# Patient Record
Sex: Female | Born: 1950 | Race: White | Hispanic: No | Marital: Married
Health system: Southern US, Community
[De-identification: ages and names within clinical notes are randomized; demographics above are authoritative.]

## PROBLEM LIST (undated history)

## (undated) ENCOUNTER — Emergency Department (HOSPITAL_COMMUNITY)

## (undated) DIAGNOSIS — M1990 Unspecified osteoarthritis, unspecified site: Secondary | ICD-10-CM

## (undated) DIAGNOSIS — K922 Gastrointestinal hemorrhage, unspecified: Secondary | ICD-10-CM

## (undated) DIAGNOSIS — I200 Unstable angina: Secondary | ICD-10-CM

## (undated) DIAGNOSIS — C189 Malignant neoplasm of colon, unspecified: Secondary | ICD-10-CM

## (undated) DIAGNOSIS — I442 Atrioventricular block, complete: Secondary | ICD-10-CM

## (undated) DIAGNOSIS — K219 Gastro-esophageal reflux disease without esophagitis: Secondary | ICD-10-CM

## (undated) DIAGNOSIS — E871 Hypo-osmolality and hyponatremia: Secondary | ICD-10-CM

## (undated) DIAGNOSIS — M797 Fibromyalgia: Secondary | ICD-10-CM

## (undated) DIAGNOSIS — R413 Other amnesia: Secondary | ICD-10-CM

## (undated) DIAGNOSIS — F32A Depression, unspecified: Secondary | ICD-10-CM

## (undated) DIAGNOSIS — D509 Iron deficiency anemia, unspecified: Secondary | ICD-10-CM

## (undated) DIAGNOSIS — M545 Low back pain, unspecified: Secondary | ICD-10-CM

## (undated) DIAGNOSIS — K259 Gastric ulcer, unspecified as acute or chronic, without hemorrhage or perforation: Secondary | ICD-10-CM

## (undated) DIAGNOSIS — E785 Hyperlipidemia, unspecified: Secondary | ICD-10-CM

## (undated) DIAGNOSIS — R55 Syncope and collapse: Secondary | ICD-10-CM

## (undated) DIAGNOSIS — D472 Monoclonal gammopathy: Secondary | ICD-10-CM

## (undated) DIAGNOSIS — F0390 Unspecified dementia without behavioral disturbance: Secondary | ICD-10-CM

## (undated) DIAGNOSIS — G8929 Other chronic pain: Secondary | ICD-10-CM

## (undated) DIAGNOSIS — R519 Headache, unspecified: Secondary | ICD-10-CM

## (undated) DIAGNOSIS — F329 Major depressive disorder, single episode, unspecified: Secondary | ICD-10-CM

## (undated) DIAGNOSIS — I2510 Atherosclerotic heart disease of native coronary artery without angina pectoris: Secondary | ICD-10-CM

## (undated) DIAGNOSIS — E039 Hypothyroidism, unspecified: Secondary | ICD-10-CM

## (undated) DIAGNOSIS — G40409 Other generalized epilepsy and epileptic syndromes, not intractable, without status epilepticus: Secondary | ICD-10-CM

## (undated) DIAGNOSIS — Z950 Presence of cardiac pacemaker: Secondary | ICD-10-CM

## (undated) DIAGNOSIS — R51 Headache: Secondary | ICD-10-CM

## (undated) DIAGNOSIS — I219 Acute myocardial infarction, unspecified: Secondary | ICD-10-CM

## (undated) HISTORY — DX: Iron deficiency anemia, unspecified: D50.9

## (undated) HISTORY — PX: POSTERIOR LAMINECTOMY / DECOMPRESSION CERVICAL SPINE: SUR739

## (undated) HISTORY — PX: INSERT / REPLACE / REMOVE PACEMAKER: SUR710

## (undated) HISTORY — DX: Monoclonal gammopathy: D47.2

## (undated) HISTORY — DX: Atherosclerotic heart disease of native coronary artery without angina pectoris: I25.10

## (undated) HISTORY — PX: CORONARY ANGIOPLASTY WITH STENT PLACEMENT: SHX49

## (undated) HISTORY — PX: COLONOSCOPY: SHX174

## (undated) HISTORY — PX: ANTERIOR CERVICAL DECOMP/DISCECTOMY FUSION: SHX1161

## (undated) HISTORY — DX: Hyperlipidemia, unspecified: E78.5

## (undated) HISTORY — DX: Hypothyroidism, unspecified: E03.9

## (undated) HISTORY — DX: Acute myocardial infarction, unspecified: I21.9

## (undated) HISTORY — PX: COLON SURGERY: SHX602

## (undated) HISTORY — PX: VAGINAL HYSTERECTOMY: SUR661

## (undated) HISTORY — DX: Malignant neoplasm of colon, unspecified: C18.9

## (undated) HISTORY — PX: BACK SURGERY: SHX140

## (undated) HISTORY — DX: Gastro-esophageal reflux disease without esophagitis: K21.9

## (undated) HISTORY — DX: Syncope and collapse: R55

## (undated) HISTORY — DX: Other amnesia: R41.3

---

## 1971-07-04 HISTORY — PX: DILATION AND CURETTAGE OF UTERUS: SHX78

## 2005-04-06 ENCOUNTER — Other Ambulatory Visit: Admission: RE | Admit: 2005-04-06 | Discharge: 2005-04-06 | Payer: Self-pay | Admitting: Obstetrics and Gynecology

## 2005-07-03 DIAGNOSIS — I219 Acute myocardial infarction, unspecified: Secondary | ICD-10-CM

## 2005-07-03 HISTORY — DX: Acute myocardial infarction, unspecified: I21.9

## 2005-07-03 HISTORY — PX: CORONARY ARTERY BYPASS GRAFT: SHX141

## 2005-08-31 ENCOUNTER — Inpatient Hospital Stay (HOSPITAL_COMMUNITY): Admission: EM | Admit: 2005-08-31 | Discharge: 2005-09-02 | Payer: Self-pay | Admitting: Emergency Medicine

## 2005-10-02 ENCOUNTER — Inpatient Hospital Stay (HOSPITAL_COMMUNITY): Admission: EM | Admit: 2005-10-02 | Discharge: 2005-10-04 | Payer: Self-pay | Admitting: Emergency Medicine

## 2005-10-26 ENCOUNTER — Encounter (HOSPITAL_COMMUNITY): Admission: RE | Admit: 2005-10-26 | Discharge: 2005-12-31 | Payer: Self-pay | Admitting: *Deleted

## 2005-12-14 ENCOUNTER — Encounter (INDEPENDENT_AMBULATORY_CARE_PROVIDER_SITE_OTHER): Payer: Self-pay | Admitting: *Deleted

## 2005-12-14 ENCOUNTER — Inpatient Hospital Stay (HOSPITAL_COMMUNITY): Admission: EM | Admit: 2005-12-14 | Discharge: 2005-12-26 | Payer: Self-pay | Admitting: Emergency Medicine

## 2006-01-25 ENCOUNTER — Encounter (HOSPITAL_COMMUNITY): Admission: RE | Admit: 2006-01-25 | Discharge: 2006-04-25 | Payer: Self-pay | Admitting: Cardiology

## 2006-04-14 ENCOUNTER — Observation Stay (HOSPITAL_COMMUNITY): Admission: EM | Admit: 2006-04-14 | Discharge: 2006-04-15 | Payer: Self-pay | Admitting: Emergency Medicine

## 2006-04-26 ENCOUNTER — Encounter (HOSPITAL_COMMUNITY): Admission: RE | Admit: 2006-04-26 | Discharge: 2006-07-25 | Payer: Self-pay | Admitting: *Deleted

## 2006-05-10 ENCOUNTER — Emergency Department (HOSPITAL_COMMUNITY): Admission: EM | Admit: 2006-05-10 | Discharge: 2006-05-11 | Payer: Self-pay | Admitting: Emergency Medicine

## 2006-08-06 ENCOUNTER — Other Ambulatory Visit: Admission: RE | Admit: 2006-08-06 | Discharge: 2006-08-06 | Payer: Self-pay | Admitting: Family Medicine

## 2006-12-31 ENCOUNTER — Observation Stay (HOSPITAL_COMMUNITY): Admission: AD | Admit: 2006-12-31 | Discharge: 2007-01-01 | Payer: Self-pay | Admitting: *Deleted

## 2008-08-14 ENCOUNTER — Observation Stay (HOSPITAL_COMMUNITY): Admission: EM | Admit: 2008-08-14 | Discharge: 2008-08-15 | Payer: Self-pay | Admitting: Emergency Medicine

## 2008-08-19 ENCOUNTER — Observation Stay (HOSPITAL_COMMUNITY): Admission: EM | Admit: 2008-08-19 | Discharge: 2008-08-20 | Payer: Self-pay | Admitting: Emergency Medicine

## 2009-02-01 ENCOUNTER — Emergency Department (HOSPITAL_COMMUNITY): Admission: EM | Admit: 2009-02-01 | Discharge: 2009-02-01 | Payer: Self-pay | Admitting: Emergency Medicine

## 2009-03-18 ENCOUNTER — Emergency Department (HOSPITAL_BASED_OUTPATIENT_CLINIC_OR_DEPARTMENT_OTHER): Admission: EM | Admit: 2009-03-18 | Discharge: 2009-03-18 | Payer: Self-pay | Admitting: Emergency Medicine

## 2009-03-18 ENCOUNTER — Ambulatory Visit: Payer: Self-pay | Admitting: Diagnostic Radiology

## 2009-05-19 ENCOUNTER — Inpatient Hospital Stay (HOSPITAL_COMMUNITY): Admission: EM | Admit: 2009-05-19 | Discharge: 2009-05-20 | Payer: Self-pay | Admitting: Emergency Medicine

## 2009-05-20 HISTORY — PX: CARDIAC CATHETERIZATION: SHX172

## 2009-11-30 ENCOUNTER — Emergency Department (HOSPITAL_COMMUNITY): Admission: EM | Admit: 2009-11-30 | Discharge: 2009-11-30 | Payer: Self-pay | Admitting: Emergency Medicine

## 2010-01-13 ENCOUNTER — Ambulatory Visit: Payer: Self-pay | Admitting: Oncology

## 2010-01-17 ENCOUNTER — Ambulatory Visit (HOSPITAL_COMMUNITY): Admission: RE | Admit: 2010-01-17 | Discharge: 2010-01-17 | Payer: Self-pay | Admitting: Oncology

## 2010-01-20 LAB — UIFE/LIGHT CHAINS/TP QN, 24-HR UR
Free Kappa/Lambda Ratio: 26.5 ratio — ABNORMAL HIGH (ref 0.46–4.00)
Free Lambda Excretion/Day: 0.9 mg/d
Time: 24 hours
Total Protein, Urine-Ur/day: 30 mg/d (ref 10–140)
Total Protein, Urine: 2.7 mg/dL
Volume, Urine: 1125 mL

## 2010-03-14 ENCOUNTER — Ambulatory Visit: Payer: Self-pay | Admitting: Cardiology

## 2010-04-08 ENCOUNTER — Ambulatory Visit: Payer: Self-pay | Admitting: Oncology

## 2010-04-11 LAB — CBC WITH DIFFERENTIAL/PLATELET
BASO%: 0.3 % (ref 0.0–2.0)
Basophils Absolute: 0 10*3/uL (ref 0.0–0.1)
EOS%: 4.7 % (ref 0.0–7.0)
Eosinophils Absolute: 0.4 10*3/uL (ref 0.0–0.5)
HCT: 40.3 % (ref 34.8–46.6)
HGB: 13.4 g/dL (ref 11.6–15.9)
LYMPH%: 30.8 % (ref 14.0–49.7)
MCHC: 33.1 g/dL (ref 31.5–36.0)
MCV: 89.2 fL (ref 79.5–101.0)
Platelets: 287 10*3/uL (ref 145–400)

## 2010-04-13 LAB — COMPREHENSIVE METABOLIC PANEL
Alkaline Phosphatase: 100 U/L (ref 39–117)
BUN: 12 mg/dL (ref 6–23)
CO2: 25 mEq/L (ref 19–32)
Calcium: 8.5 mg/dL (ref 8.4–10.5)
Glucose, Bld: 121 mg/dL — ABNORMAL HIGH (ref 70–99)
Total Protein: 6.9 g/dL (ref 6.0–8.3)

## 2010-04-13 LAB — SPEP & IFE WITH QIG
Albumin ELP: 53.4 % — ABNORMAL LOW (ref 55.8–66.1)
Alpha-2-Globulin: 11 % (ref 7.1–11.8)
Beta 2: 3.7 % (ref 3.2–6.5)
Beta Globulin: 5.9 % (ref 4.7–7.2)
IgA: 205 mg/dL (ref 68–378)
IgG (Immunoglobin G), Serum: 1270 mg/dL (ref 694–1618)
IgM, Serum: 122 mg/dL (ref 60–263)
M-Spike, %: 0.52 g/dL

## 2010-04-13 LAB — KAPPA/LAMBDA LIGHT CHAINS
Kappa free light chain: 1.2 mg/dL (ref 0.33–1.94)
Lambda Free Lght Chn: 1.34 mg/dL (ref 0.57–2.63)

## 2010-05-11 ENCOUNTER — Encounter: Admission: RE | Admit: 2010-05-11 | Discharge: 2010-05-11 | Payer: Self-pay | Admitting: Pediatrics

## 2010-07-07 ENCOUNTER — Ambulatory Visit: Payer: Self-pay | Admitting: Oncology

## 2010-07-11 LAB — CBC WITH DIFFERENTIAL/PLATELET
BASO%: 0.4 % (ref 0.0–2.0)
Basophils Absolute: 0 10*3/uL (ref 0.0–0.1)
EOS%: 3.9 % (ref 0.0–7.0)
Eosinophils Absolute: 0.3 10*3/uL (ref 0.0–0.5)
HCT: 39.5 % (ref 34.8–46.6)
HGB: 13.4 g/dL (ref 11.6–15.9)
LYMPH%: 39.7 % (ref 14.0–49.7)
MCH: 30.1 pg (ref 25.1–34.0)
MCHC: 33.9 g/dL (ref 31.5–36.0)
MCV: 88.7 fL (ref 79.5–101.0)
MONO#: 0.5 10*3/uL (ref 0.1–0.9)
MONO%: 7.2 % (ref 0.0–14.0)
NEUT#: 3.3 10*3/uL (ref 1.5–6.5)
NEUT%: 48.8 % (ref 38.4–76.8)
Platelets: 338 10*3/uL (ref 145–400)
RBC: 4.45 10*6/uL (ref 3.70–5.45)
RDW: 13.2 % (ref 11.2–14.5)
WBC: 6.7 10*3/uL (ref 3.9–10.3)
lymph#: 2.7 10*3/uL (ref 0.9–3.3)

## 2010-07-13 LAB — COMPREHENSIVE METABOLIC PANEL
ALT: 12 U/L (ref 0–35)
AST: 11 U/L (ref 0–37)
Albumin: 4.4 g/dL (ref 3.5–5.2)
Alkaline Phosphatase: 105 U/L (ref 39–117)
BUN: 16 mg/dL (ref 6–23)
CO2: 26 mEq/L (ref 19–32)
Calcium: 9.1 mg/dL (ref 8.4–10.5)
Chloride: 101 mEq/L (ref 96–112)
Creatinine, Ser: 0.63 mg/dL (ref 0.40–1.20)
Glucose, Bld: 107 mg/dL — ABNORMAL HIGH (ref 70–99)
Potassium: 4.3 mEq/L (ref 3.5–5.3)
Sodium: 137 mEq/L (ref 135–145)
Total Bilirubin: 0.2 mg/dL — ABNORMAL LOW (ref 0.3–1.2)
Total Protein: 7.3 g/dL (ref 6.0–8.3)

## 2010-07-13 LAB — PROTEIN ELECTROPHORESIS, SERUM
Albumin ELP: 55.8 % (ref 55.8–66.1)
Alpha-1-Globulin: 5.1 % — ABNORMAL HIGH (ref 2.9–4.9)
Alpha-2-Globulin: 12.3 % — ABNORMAL HIGH (ref 7.1–11.8)
Beta 2: 5.1 % (ref 3.2–6.5)
Beta Globulin: 5.3 % (ref 4.7–7.2)
Gamma Globulin: 16.4 % (ref 11.1–18.8)
M-Spike, %: 0.49 g/dL
Total Protein, Serum Electrophoresis: 7.3 g/dL (ref 6.0–8.3)

## 2010-07-13 LAB — KAPPA/LAMBDA LIGHT CHAINS
Kappa free light chain: 1.08 mg/dL (ref 0.33–1.94)
Kappa:Lambda Ratio: 1.04 (ref 0.26–1.65)
Lambda Free Lght Chn: 1.04 mg/dL (ref 0.57–2.63)

## 2010-07-24 ENCOUNTER — Encounter: Payer: Self-pay | Admitting: Oncology

## 2010-09-19 LAB — URINALYSIS, ROUTINE W REFLEX MICROSCOPIC
Bilirubin Urine: NEGATIVE
Glucose, UA: NEGATIVE mg/dL
Hgb urine dipstick: NEGATIVE
Ketones, ur: NEGATIVE mg/dL
Specific Gravity, Urine: 1.017 (ref 1.005–1.030)

## 2010-09-19 LAB — BASIC METABOLIC PANEL
BUN: 13 mg/dL (ref 6–23)
CO2: 26 mEq/L (ref 19–32)
Chloride: 104 mEq/L (ref 96–112)
Creatinine, Ser: 0.52 mg/dL (ref 0.4–1.2)

## 2010-09-19 LAB — DIFFERENTIAL
Basophils Absolute: 0 10*3/uL (ref 0.0–0.1)
Eosinophils Relative: 8 % — ABNORMAL HIGH (ref 0–5)
Lymphocytes Relative: 39 % (ref 12–46)
Lymphs Abs: 3.1 10*3/uL (ref 0.7–4.0)
Monocytes Absolute: 0.5 10*3/uL (ref 0.1–1.0)
Neutro Abs: 3.8 10*3/uL (ref 1.7–7.7)

## 2010-09-19 LAB — POCT CARDIAC MARKERS
Myoglobin, poc: 43.5 ng/mL (ref 12–200)
Troponin i, poc: <0.05 ng/mL (ref 0.00–0.09)

## 2010-09-19 LAB — CBC
HCT: 37.8 % (ref 36.0–46.0)
RDW: 13.1 % (ref 11.5–15.5)

## 2010-09-19 LAB — APTT: aPTT: 24 seconds (ref 24–37)

## 2010-10-05 LAB — POCT CARDIAC MARKERS
CKMB, poc: <1 ng/mL — ABNORMAL LOW (ref 1.0–8.0)
Troponin i, poc: <0.05 ng/mL (ref 0.00–0.09)

## 2010-10-05 LAB — HEPARIN LEVEL (UNFRACTIONATED): Heparin Unfractionated: 0.62 IU/mL (ref 0.30–0.70)

## 2010-10-05 LAB — LIPID PANEL: Triglycerides: 164 mg/dL — ABNORMAL HIGH (ref <150)

## 2010-10-05 LAB — CBC
HCT: 37.8 % (ref 36.0–46.0)
MCHC: 34.1 g/dL (ref 30.0–36.0)
MCV: 89.4 fL (ref 78.0–100.0)
Platelets: 255 10*3/uL (ref 150–400)
Platelets: 279 10*3/uL (ref 150–400)
WBC: 8.1 10*3/uL (ref 4.0–10.5)
WBC: 9.6 10*3/uL (ref 4.0–10.5)

## 2010-10-05 LAB — DIFFERENTIAL
Basophils Relative: 1 % (ref 0–1)
Eosinophils Absolute: 0.2 10*3/uL (ref 0.0–0.7)
Monocytes Absolute: 0.6 10*3/uL (ref 0.1–1.0)
Monocytes Relative: 7 % (ref 3–12)

## 2010-10-05 LAB — CK TOTAL AND CKMB (NOT AT ARMC)
Relative Index: INVALID (ref 0.0–2.5)
Relative Index: INVALID (ref 0.0–2.5)
Relative Index: INVALID (ref 0.0–2.5)
Total CK: 29 U/L (ref 7–177)

## 2010-10-05 LAB — BASIC METABOLIC PANEL
BUN: 16 mg/dL (ref 6–23)
CO2: 24 mEq/L (ref 19–32)
Calcium: 8.8 mg/dL (ref 8.4–10.5)
Chloride: 104 mEq/L (ref 96–112)
Creatinine, Ser: 0.61 mg/dL (ref 0.4–1.2)

## 2010-10-05 LAB — TSH: TSH: 1.346 u[IU]/mL (ref 0.350–4.500)

## 2010-10-05 LAB — COMPREHENSIVE METABOLIC PANEL
ALT: 18 U/L (ref 0–35)
AST: 13 U/L (ref 0–37)
Albumin: 3.3 g/dL — ABNORMAL LOW (ref 3.5–5.2)
Alkaline Phosphatase: 77 U/L (ref 39–117)
BUN: 13 mg/dL (ref 6–23)
GFR calc Af Amer: >60 mL/min (ref >60)
Potassium: 4.6 mEq/L (ref 3.5–5.1)
Sodium: 140 mEq/L (ref 135–145)
Total Protein: 6.5 g/dL (ref 6.0–8.3)

## 2010-10-05 LAB — TROPONIN I
Troponin I: 0.01 ng/mL (ref 0.00–0.06)
Troponin I: 0.01 ng/mL (ref 0.00–0.06)

## 2010-10-05 LAB — PROTIME-INR: INR: 1.15 (ref 0.00–1.49)

## 2010-10-07 LAB — CBC
HCT: 40.5 % (ref 36.0–46.0)
Hemoglobin: 14 g/dL (ref 12.0–15.0)
RBC: 4.65 MIL/uL (ref 3.87–5.11)
RDW: 12.4 % (ref 11.5–15.5)
WBC: 5.2 10*3/uL (ref 4.0–10.5)

## 2010-10-07 LAB — DIFFERENTIAL
Basophils Relative: 1 % (ref 0–1)
Eosinophils Relative: 1 % (ref 0–5)
Lymphs Abs: 2.1 10*3/uL (ref 0.7–4.0)
Monocytes Relative: 16 % — ABNORMAL HIGH (ref 3–12)
Neutro Abs: 2.1 10*3/uL (ref 1.7–7.7)

## 2010-10-07 LAB — BASIC METABOLIC PANEL
GFR calc Af Amer: >60 mL/min (ref >60)
GFR calc non Af Amer: >60 mL/min (ref >60)
Glucose, Bld: 117 mg/dL — ABNORMAL HIGH (ref 70–99)
Potassium: 4.2 mEq/L (ref 3.5–5.1)
Sodium: 136 mEq/L (ref 135–145)

## 2010-10-08 LAB — URINALYSIS, ROUTINE W REFLEX MICROSCOPIC
Glucose, UA: NEGATIVE mg/dL
Hgb urine dipstick: NEGATIVE
Specific Gravity, Urine: 1.026 (ref 1.005–1.030)

## 2010-10-18 LAB — LIPID PANEL
HDL: 58 mg/dL (ref >39)
Total CHOL/HDL Ratio: 3.2 RATIO
Triglycerides: 126 mg/dL (ref <150)

## 2010-10-18 LAB — DIFFERENTIAL
Basophils Absolute: 0 10*3/uL (ref 0.0–0.1)
Basophils Absolute: 0.1 10*3/uL (ref 0.0–0.1)
Basophils Relative: 0 % (ref 0–1)
Eosinophils Absolute: 0.4 10*3/uL (ref 0.0–0.7)
Eosinophils Relative: 5 % (ref 0–5)
Eosinophils Relative: 5 % (ref 0–5)
Lymphocytes Relative: 34 % (ref 12–46)
Monocytes Absolute: 0.6 10*3/uL (ref 0.1–1.0)
Neutrophils Relative %: 57 % (ref 43–77)

## 2010-10-18 LAB — COMPREHENSIVE METABOLIC PANEL
ALT: 21 U/L (ref 0–35)
AST: 13 U/L (ref 0–37)
AST: 20 U/L (ref 0–37)
Albumin: 3.7 g/dL (ref 3.5–5.2)
Alkaline Phosphatase: 79 U/L (ref 39–117)
BUN: 14 mg/dL (ref 6–23)
CO2: 23 mEq/L (ref 19–32)
CO2: 24 mEq/L (ref 19–32)
Chloride: 103 mEq/L (ref 96–112)
Chloride: 102 mEq/L (ref 96–112)
Creatinine, Ser: 0.58 mg/dL (ref 0.4–1.2)
Creatinine, Ser: 0.71 mg/dL (ref 0.4–1.2)
GFR calc Af Amer: >60 mL/min (ref >60)
GFR calc Af Amer: >60 mL/min (ref >60)
GFR calc non Af Amer: >60 mL/min (ref >60)
GFR calc non Af Amer: >60 mL/min (ref >60)
Glucose, Bld: 114 mg/dL — ABNORMAL HIGH (ref 70–99)
Potassium: 4.2 mEq/L (ref 3.5–5.1)
Sodium: 135 mEq/L (ref 135–145)
Total Bilirubin: 0.5 mg/dL (ref 0.3–1.2)
Total Bilirubin: 0.4 mg/dL (ref 0.3–1.2)

## 2010-10-18 LAB — PROTIME-INR
Prothrombin Time: 13.4 seconds (ref 11.6–15.2)
Prothrombin Time: 13.5 seconds (ref 11.6–15.2)

## 2010-10-18 LAB — CK TOTAL AND CKMB (NOT AT ARMC)
CK, MB: 0.7 ng/mL (ref 0.3–4.0)
Relative Index: INVALID (ref 0.0–2.5)
Total CK: 32 U/L (ref 7–177)

## 2010-10-18 LAB — CARDIAC PANEL(CRET KIN+CKTOT+MB+TROPI)
CK, MB: 0.8 ng/mL (ref 0.3–4.0)
CK, MB: 0.6 ng/mL (ref 0.3–4.0)
Relative Index: INVALID (ref 0.0–2.5)
Relative Index: INVALID (ref 0.0–2.5)
Relative Index: INVALID (ref 0.0–2.5)
Total CK: 23 U/L (ref 7–177)
Total CK: 23 U/L (ref 7–177)
Total CK: 28 U/L (ref 7–177)
Troponin I: 0.04 ng/mL (ref 0.00–0.06)
Troponin I: <0.01 ng/mL (ref 0.00–0.06)
Troponin I: <0.01 ng/mL (ref 0.00–0.06)
Troponin I: <0.01 ng/mL (ref 0.00–0.06)

## 2010-10-18 LAB — LIPASE, BLOOD: Lipase: 22 U/L (ref 11–59)

## 2010-10-18 LAB — BASIC METABOLIC PANEL
BUN: 12 mg/dL (ref 6–23)
CO2: 25 mEq/L (ref 19–32)
Chloride: 102 mEq/L (ref 96–112)
Creatinine, Ser: 0.62 mg/dL (ref 0.4–1.2)
Glucose, Bld: 105 mg/dL — ABNORMAL HIGH (ref 70–99)
Potassium: 4.3 mEq/L (ref 3.5–5.1)

## 2010-10-18 LAB — POCT I-STAT, CHEM 8
BUN: 12 mg/dL (ref 6–23)
Calcium, Ion: 1.02 mmol/L — ABNORMAL LOW (ref 1.12–1.32)
Chloride: 105 mEq/L (ref 96–112)
Creatinine, Ser: 0.7 mg/dL (ref 0.4–1.2)
Glucose, Bld: 119 mg/dL — ABNORMAL HIGH (ref 70–99)
HCT: 43 % (ref 36.0–46.0)
Hemoglobin: 14.6 g/dL (ref 12.0–15.0)
Potassium: 4.2 meq/L (ref 3.5–5.1)
Sodium: 136 meq/L (ref 135–145)
TCO2: 22 mmol/L (ref 0–100)

## 2010-10-18 LAB — URINE CULTURE: Colony Count: 80000

## 2010-10-18 LAB — POCT CARDIAC MARKERS
CKMB, poc: <1 ng/mL — ABNORMAL LOW (ref 1.0–8.0)
Myoglobin, poc: 41.1 ng/mL (ref 12–200)
Troponin i, poc: <0.05 ng/mL (ref 0.00–0.09)
Troponin i, poc: <0.05 ng/mL (ref 0.00–0.09)

## 2010-10-18 LAB — HEPARIN LEVEL (UNFRACTIONATED): Heparin Unfractionated: <0.1 IU/mL — ABNORMAL LOW (ref 0.30–0.70)

## 2010-10-18 LAB — URINE MICROSCOPIC-ADD ON

## 2010-10-18 LAB — CBC
HCT: 37.4 % (ref 36.0–46.0)
HCT: 40.5 % (ref 36.0–46.0)
HCT: 39.9 % (ref 36.0–46.0)
MCHC: 34.5 g/dL (ref 30.0–36.0)
MCV: 90.1 fL (ref 78.0–100.0)
MCV: 90.2 fL (ref 78.0–100.0)
MCV: 90.7 fL (ref 78.0–100.0)
Platelets: 268 10*3/uL (ref 150–400)
Platelets: 300 10*3/uL (ref 150–400)
RBC: 4.49 MIL/uL (ref 3.87–5.11)
RDW: 12.8 % (ref 11.5–15.5)
RDW: 12.6 % (ref 11.5–15.5)
WBC: 7.5 10*3/uL (ref 4.0–10.5)
WBC: 8 10*3/uL (ref 4.0–10.5)

## 2010-10-18 LAB — URINALYSIS, ROUTINE W REFLEX MICROSCOPIC
Glucose, UA: NEGATIVE mg/dL
Ketones, ur: NEGATIVE mg/dL
Nitrite: NEGATIVE
Protein, ur: NEGATIVE mg/dL
pH: 5.5 (ref 5.0–8.0)

## 2010-10-18 LAB — TROPONIN I: Troponin I: <0.01 ng/mL (ref 0.00–0.06)

## 2010-10-18 LAB — BRAIN NATRIURETIC PEPTIDE: Pro B Natriuretic peptide (BNP): 40 pg/mL (ref 0.0–100.0)

## 2010-11-11 ENCOUNTER — Other Ambulatory Visit: Payer: Self-pay | Admitting: Oncology

## 2010-11-11 ENCOUNTER — Encounter (HOSPITAL_BASED_OUTPATIENT_CLINIC_OR_DEPARTMENT_OTHER): Payer: BC Managed Care – PPO | Admitting: Oncology

## 2010-11-11 DIAGNOSIS — E039 Hypothyroidism, unspecified: Secondary | ICD-10-CM

## 2010-11-11 DIAGNOSIS — F411 Generalized anxiety disorder: Secondary | ICD-10-CM

## 2010-11-11 DIAGNOSIS — IMO0001 Reserved for inherently not codable concepts without codable children: Secondary | ICD-10-CM

## 2010-11-11 DIAGNOSIS — D472 Monoclonal gammopathy: Secondary | ICD-10-CM

## 2010-11-11 LAB — CBC WITH DIFFERENTIAL/PLATELET
Basophils Absolute: 0 10*3/uL (ref 0.0–0.1)
Eosinophils Absolute: 0.4 10*3/uL (ref 0.0–0.5)
LYMPH%: 39.8 % (ref 14.0–49.7)
MCV: 89.9 fL (ref 79.5–101.0)
MONO%: 6.9 % (ref 0.0–14.0)
NEUT#: 2.9 10*3/uL (ref 1.5–6.5)
Platelets: 276 10*3/uL (ref 145–400)
RBC: 4.27 10*6/uL (ref 3.70–5.45)

## 2010-11-15 LAB — COMPREHENSIVE METABOLIC PANEL
Alkaline Phosphatase: 97 U/L (ref 39–117)
BUN: 12 mg/dL (ref 6–23)
Glucose, Bld: 104 mg/dL — ABNORMAL HIGH (ref 70–99)
Sodium: 138 mEq/L (ref 135–145)
Total Bilirubin: 0.2 mg/dL — ABNORMAL LOW (ref 0.3–1.2)

## 2010-11-15 LAB — PROTEIN ELECTROPHORESIS, SERUM
Albumin ELP: 57.2 % (ref 55.8–66.1)
Alpha-1-Globulin: 4.8 % (ref 2.9–4.9)
Alpha-2-Globulin: 11.9 % — ABNORMAL HIGH (ref 7.1–11.8)
Beta 2: 4.5 % (ref 3.2–6.5)
Beta Globulin: 5.6 % (ref 4.7–7.2)

## 2010-11-15 LAB — KAPPA/LAMBDA LIGHT CHAINS: Kappa:Lambda Ratio: 1.16 (ref 0.26–1.65)

## 2010-11-15 NOTE — Discharge Summary (Signed)
Rachel Clark, Rachel Clark             ACCOUNT NO.:  [id number]   MEDICAL RECORD NO.:  [id number]          PATIENT TYPE:  INP   LOCATION:  2019                         FACILITY:  [facility]   PHYSICIAN:  Elmore Guise., M.D.DATE OF BIRTH:  [birth year]   DATE OF ADMISSION:  12/31/2006  DATE OF DISCHARGE:  01/01/2007                               DISCHARGE SUMMARY   DISCHARGE DIAGNOSES:  1. Gastritis.  2. History of coronary artery disease.  3. Borderline hypertension.  4. Dyslipidemia.  5. Seizure disorder.  6. Fibromyalgia.   HISTORY OF PRESENT ILLNESS:  Rachel Clark is a very pleasant 60 year old  white female, past medical history of coronary artery disease, presented  to the office with chest pain and dyspnea.  She was admitted for  observation.   HOSPITAL COURSE:  The patient's hospital course was uncomplicated.  She  became chest pain free after placement of nitroglycerin patch and GI  cocktail.  The patient's cardiac enzymes remained normal and serial EKGs  were also normal.  Because of her symptoms, she was sent for a CT scan  of the chest to rule out pulmonary embolism, which was negative and  showed no acute findings.  She has now been chest pain free, tolerating  p.o. diet and up and active without any significant problems.  She does  report today that she had been using ibuprofen a little bit more than  normal, because her fibromyalgia was acting up.  She also reports that  after her stomach medication was given, her symptoms quickly resolved.  She will be discharged home today to continue the following medications.  1. Tegretol.  2. Zarontin both as before.  3. Synthroid 50 mcg daily.  4. Celexa 20 mg daily.  5. Toprol XL 50 mg half tablet daily.  6. Nitroglycerin patch 0.2 mg per hour, apply in the morning, remove      at night.  7. Nexium 40 mg two times daily for 7 days, then one time daily.  8. Nitroglycerin p.r.n.  9. Zyrtec 10 mg daily.  10.Aspirin 81 mg  daily.  11.Zetia 10 mg daily.   She was instructed to increase her activity slowly, to watch her diet  and to stay off any nonsteroidal pain medications for the time being.  If her symptoms continue, we will send her for GI evaluation; however,  the patient defers that at this time.  She is to call the office with  any questions or concerns.  Otherwise, I will see her in 2 to 4 weeks.      Elmore Guise., M.D.  Electronically Signed     TWK/MEDQ  D:  01/01/2007  T:  01/01/2007  Job:  [id number]

## 2010-11-15 NOTE — H&P (Signed)
NAMESAGRARIO, LINEBERRY NO.:  [id number]   MEDICAL RECORD NO.:  [id number]          PATIENT TYPE:  EMS   LOCATION:  MAJO                         FACILITY:  [facility]   PHYSICIAN:  Elmore Guise., M.D.DATE OF BIRTH:  [birth year]   DATE OF ADMISSION:  08/19/2008  DATE OF DISCHARGE:                              HISTORY & PHYSICAL   INDICATION FOR ADMISSION:  Recurrent chest pain.   HISTORY OF PRESENT ILLNESS:  Ms. Mast is a pleasant 60 year old white  female with past medical history of coronary artery disease (status post  two-vessel coronary artery bypass grafting in June 2007), dyslipidemia,  hypothyroidism, seizure disorder and history of fibromyalgia, who  presents for evaluation of chest pain.  The patient had a recent  hospitalization on August 14, 2008, through August 15, 2008, for  evaluation of chest pain.  She underwent cardiac catheterization on  August 14, 2008, which showed a patent LIMA to her LAD and patent vein  graft to her RCA.  Her LAD was a moderate size vessel with moderate  diffuse disease.  Competitive flow was noted in her mid vessel.  Her  first diagonal was small with moderate proximal disease.  Her circumflex  was nondominant with mild luminal irregularities and her RCA had a  patent proximal stent with 50-60% stenosis after the stent.  Her EF was  65%.  She was placed on Effient because of native vessel disease and her  symptoms.  She was continued on her Ranexa.  She did well after going  home that Saturday.  Sunday, she did well.  On Monday and Tuesday, she  started having some left-sided chest discomfort.  She denied any  significant exertional worsening.  Last night, she was watching the  Olympics and started having some off-and-on chest pressure on the left  side with radiation up to her neck.  Her symptoms resolved  spontaneously.  She was able to go to bed.  She awoke early this morning  around 3:00 a.m. with left-sided  chest pressure and heaviness very  intense.  This was associated with nausea, diaphoresis and difficulty  breathing.  She tried a nitro with minimal improvement.  She went and  took an aspirin.  She continued to lay in bed for approximately 30  minutes; however, her symptoms did not improve any further.  The patient  then came to the emergency room for further evaluation.  On arrival to  the emergency room, she was again given more aspirin and nitroglycerin  without any significant changes in her symptoms.  Her symptoms then  resolved over the next hour.  She has had no further symptoms over the  last 2 hours.  She denies any recent fever or cough.  She has had no  orthopnea or PND.  She is taking her medications as instructed.  She has  had no claudication symptoms.  Her cath site is well-healed.  She has  had no abdominal pain or indigestion.   REVIEW OF SYSTEMS:  As per HPI, all others are negative.   CURRENT MEDICATIONS:  1. Aspirin 81 mg daily.  2. Fish oil 1000 mg daily.  3. Zyrtec 10 mg daily.  4. Synthroid 50 mcg daily.  5. Tegretol twice daily.  6. Nexium 40 mg daily.  7. Zarontin four times daily.  8. Zetia 10 mg daily.  9. Celexa 10 mg daily.  10.Ranexa 500 mg twice daily.  11.Effient 10 mg daily.   ALLERGIES:  1. ERYTHROMYCIN.  2. IMDUR.  3. LISINOPRIL.  4. PENICILLIN.  5. SULFA.  6. TETRACYCLINE.   FAMILY HISTORY:  Positive for heart disease and stroke.   SOCIAL HISTORY:  She is married.  She does work part-time.  She denies  any tobacco or alcohol use.   PHYSICAL EXAMINATION:  VITAL SIGNS:  She is afebrile, temperature is  98.1, blood pressure is 120/70 with heart rate in the 60-70 range.  O2  sats are 98%.  GENERAL:  She is a very pleasant middle-aged white female, alert and  oriented x4 in no acute distress.  HEENT:  Unremarkable.  NECK:  Supple, no lymphadenopathy, no JVD, no bruits, no thyromegaly.  LUNGS:  Clear.  HEART:  Regular with normal  S1-S2.  ABDOMEN:  Soft, nontender, nondistended.  No rebound or guarding.  No  epigastric pain on palpation.  EXTREMITIES:  Warm with 2+ pulses and no edema.  NEUROLOGICAL:  No focal deficits.  SKIN:  Warm and dry.   LABORATORY DATA:  Her blood work today shows a BNP level of 40.  Troponin-I of less than 0.01.  Lipase 22, CPK of 37 and MB of 0.9.  Her  potassium is 4.3 with a BUN and creatinine of 10 and 0.7.  Normal LFTs.  Her initial set of cardiac markers showed a myoglobin of 41.  Her coags  are normal.  Her white count is 8.3 with a hemoglobin of 13.9 and a  platelet count of 300.  She has a normal differential.  Her chest x-ray  when compared to her prior study done 5 days ago, no acute changes.  No  evidence of volume overload, mediastinal widening or infection.  EKG  shows normal sinus rhythm, normal axis with no ST or T-wave changes.  Her last CT of the chest was done back in June 2008, that showed no  acute thoracic abnormalities.   IMPRESSION:  1. Recurrent chest pain.  2. History of known coronary artery disease.  3. Dyslipidemia (statin intolerant).  4. Seizure disorder.  5. Gastroesophageal reflux disease.  6. History of fibromyalgia.   PLAN:  The patient will be admitted for further evaluation.  Her enzymes  and EKG are normal.  This is atypical for acute coronary syndrome.  I  discussed this with her at length.  I would like to get a CT of the  chest today to evaluate for any possible aortic pathology or pulmonary  embolism.  Should her symptoms continue, we will consider a GI  referral.  I will continue her Effient and Ranexa as before.  Further  treatment pending her CT scan.  I do plan to hold off on heparin at this  time.  The patient is very uneasy with heparin since she had a family  member die from a heparin allergy.      Elmore Guise., M.D.  Electronically Signed     TWK/MEDQ  D:  08/19/2008  T:  08/19/2008  Job:  [id number]

## 2010-11-15 NOTE — Discharge Summary (Signed)
Rachel Clark, Rachel Clark             ACCOUNT NO.:  [id number]   MEDICAL RECORD NO.:  [id number]          PATIENT TYPE:  INP   LOCATION:  4731                         FACILITY:  [facility]   PHYSICIAN:  Elmore Guise., M.D.DATE OF BIRTH:  1950-10-26   DATE OF ADMISSION:  08/19/2008  DATE OF DISCHARGE:  08/20/2008                               DISCHARGE SUMMARY   DISCHARGE DIAGNOSES:  1. Chest pain, likely secondary to hiatal hernia.  2. History of coronary artery disease, status post coronary artery      bypass grafting with recent catheterization showing patent grafts      with moderate native vessel disease.  3. Stress echo done during this hospitalization showing good exercise      tolerance, normal left ventricular systolic function, and no      evidence of inducible ischemia with stress.  4. History of seizure disorder.  5. History of dyslipidemia (STATIN intolerant).  6. History of fibromyalgia.   HISTORY OF PRESENT ILLNESS:  Rachel Clark is a very pleasant 60 year old  white female with past medical history of coronary artery disease,  dyslipidemia, hypothyroidism, and seizure disorder who presented for  recurrence of chest pain.  The patient awoke early on day of admission  with pressure and cramping in her upper chest area.  She tried  nitroglycerin with some improvement; however, because of continued  symptoms, she came to the emergency room for further evaluation.   HOSPITAL COURSE:  The patient's hospital course was uncomplicated.  On  day of admission, she did undergo a CT of the chest, which showed no  aortic pathology.  She had no PE.  She did have a very small hiatal  hernia noted.  She was admitted to the hospital.  She ruled out for  myocardial infarction.  However, because of continued pain, she did  undergo a stress echo on August 20, 2008.  She exercised via Bruce  protocol for 6 minutes and 45 seconds achieving a peak heart rate of 166  beats per minute  corresponding to greater than 100% of age-predicted  maximum.  She had no ischemic ECG changes.  Resting echo images showed  normal LV size and function with an EF of 55-60%.  Stress echo images  showed hyperdynamic LV systolic function with no evidence of inducible  ischemia.  The patient has been up and ambulatory and doing well.  She  will be discharged home.   DISCHARGE MEDICATIONS:  1. Aspirin 81 mg daily.  2. Fish oil 1000 mg daily.  3. Zyrtec 10 mg daily.  4. Synthroid 50 mcg daily.  5. Tegretol twice daily.  6. Nexium 40 mg daily.  7. Zarontin 4 times daily.  8. Zetia 10 mg daily.  9. Celexa 10 mg daily.  10.Effient 10 mg daily.  11.Maalox or Mylanta on a p.r.n. basis.   She was told to stop her Ranexa because of potential interaction with  her Tegretol.  I do think her symptoms are likely secondary to her  hiatal hernia.  If her symptoms continue, I will discuss possible  referral for GI evaluation.  She  will continue Effient to complete a 28-month supply and then stop.  She will continue her other medicines as listed.  She may return to her  normal activities as tolerated.  She is to call the office if she has  any further problems.  Otherwise, I will see her back in a regular  scheduled office visit.      Elmore Guise., M.D.  Electronically Signed     TWK/MEDQ  D:  08/20/2008  T:  08/20/2008  Job:  [id number]

## 2010-11-15 NOTE — Cardiovascular Report (Signed)
Rachel Clark, Rachel Clark             ACCOUNT NO.:  [id number]   MEDICAL RECORD NO.:  [id number]          PATIENT TYPE:  INP   LOCATION:  3706                         FACILITY:  [facility]   PHYSICIAN:  Elmore Guise., M.D.DATE OF BIRTH:  Nov 13, 1950   DATE OF PROCEDURE:  DATE OF DISCHARGE:                            CARDIAC CATHETERIZATION   INDICATIONS FOR PROCEDURE:  Increasing exertional chest pain now with  chest pain at rest.  The patient with known coronary disease.   DESCRIPTION OF PROCEDURE:  The patient was brought to the cardiac cath  lab.  After appropriate informed consent, she was prepped and draped in  sterile fashion.  Approximately 10 mL of 1% lidocaine was used for local  anesthesia.  A 6-French sheath was placed in the right femoral artery  without difficulty.  Coronary angiography, LV angiography, vein graft  angiography, and LIMA angiography were performed.  The patient tolerated  the procedure well and was transferred from the cardiac cath lab in  stable condition.  Because of right leg pain, a limited right femoral  angiogram was performed which showed no dissection and no extravasation  of dye.   FINDINGS:  1. Left Main:  Normal.  2. LAD:  Moderate-sized vessel with moderate diffuse disease.      Competitive flow was noted in the midvessel.  3. D1:  Small vessel with moderate proximal disease.  4. LCX:  Nondominant with mild luminal irregularities.  5. RCA:  Patent proximal stent with 50-60% stenosis after the stent.      Mid vessel bifurcates with moderate diffuse disease noted down each      branch.  PDA has mild diffuse disease.  Her vein graft is seen and      is patent with good flow back to the proximal portion of the vein      graft by injecting into the native RCA.  6. Vein graft to distal RCA is patent with good distal flow noted.  7. LIMA to LAD is patent with good flow in the mid and distal vessel      with backflow noted to the proximal vessel and  first diagonal.  8. LV:  EF is 65%.  No wall motion abnormalities.  LVEDP is 14 mmHg.   IMPRESSION:  1. Obstructive native vessel disease in her left anterior descending,      right coronary artery, and first diagonal.  2. Patent vein graft to distal right coronary artery and left internal      mammary artery to left anterior descending.  3. Preserved left ventricular systolic function, ejection fraction of      65%.   The patient was admitted with anginal-type symptoms.  I will add dual  antiplatelet agents to her regimen and continue aggressive medical  therapy.  We will also add nitroglycerin patch 0.2 mg per hour.  She  will continue her other medicines as listed.      Elmore Guise., M.D.  Electronically Signed    TWK/MEDQ  D:  08/14/2008  T:  08/15/2008  Job:  [id number]

## 2010-11-15 NOTE — H&P (Signed)
Rachel Clark, Rachel Clark             ACCOUNT NO.:  [id number]   MEDICAL RECORD NO.:  [id number]           PATIENT TYPE:   LOCATION:                                 FACILITY:   PHYSICIAN:  Elmore Guise., M.D.DATE OF BIRTH:  03-04-1951   DATE OF ADMISSION:  12/31/2006  DATE OF DISCHARGE:                              HISTORY & PHYSICAL   INDICATION FOR ADMISSION:  Chest pain.   HISTORY OF PRESENT ILLNESS:  [name] is a very pleasant 60 year old  white female with past medical history of coronary artery disease status  post two-vessel  coronary artery bypass grafting December 21, 2005, LIMA to  LAD, vein graft to distal RCA; dyslipidemia; hypertension; seizure  disorder; gastroesophageal reflux disease who presented with chest pain.  The patient reports over the last 2 days she has been having increased  symptoms.  She first did not think anything of it.  [place] and she got extremely short of breath.  Normally she can do this activity without any significant problems.  She  initially thought it may have been heat related.  However, today she has  been having diffuse fatigue as well as now episodes of chest discomfort  which she describes as a tightness and a squeezing.  She took one  sublingual nitroglycerin with no relief.  This was followed by two more  with still no relief.  She then presented to the office for further  evaluation.  On arrival, the patient was stable with a blood pressure of  150/80 and a heart rate of 60.  Her O2 saturation was 97%.  Her EKG had  nonspecific ST-T wave changes.  She is now referred for admission and  evaluation.  She does report her chest pain as a pressure and a  tightness in her substernal area associated with nausea and diaphoresis.  She reports, I just can't get my breath.  She denies any lower  extremity edema.  She denies any difficulty with long periods of  immobility.  She does continue to  use estrogen replacement therapy with  Vivelle-Dot patches changed twice per week.  She has had no  palpitations, no presyncope or syncope.  She denies any fever.  She does  have nausea as stated above, but no vomiting and no diarrhea.  She also  reports no cough.   REVIEW OF SYSTEMS:  As per HPI.  All others are negative.   CURRENT MEDICATIONS:  1. Aspirin 81 mg daily.  2. Tegretol 500 mg twice daily.  3. Synthroid 0.05 mg daily.  4. Zarontin 250/500/250/500 all daily.  5. Celexa once daily.  6. Nitroglycerin p.r.n.  7. Toprol-XL 50 mg daily.  8. Zetia 10 mg daily.  9. Vivelle-Dot patch changed twice per week.   ALLERGIES:  1. TETRACYCLINE.  2. ERYTHROMYCIN.  3. PENICILLIN.  4. ACE INHIBITORS causing cough.  5. IMDUR causing severe headaches.   FAMILY HISTORY:  Positive for coronary artery disease with her father  died from a heart attack at age 37.   SOCIAL  HISTORY:  She is married.  She has a 20 pack-year history of  tobacco, quit 20 years ago.  No alcohol.  She tries to exercise daily.   EXAMINATION:  VITAL SIGNS:  Her blood pressure is 150/80 and on recheck,  136/80.  Her heart rate is 59 and regular.  GENERAL:  She is a very pleasant, middle-aged white female, alert and  oriented x4, in no significant distress.  HEENT:  Appeared normal.  NECK:  Supple.  No lymphadenopathy, 2+ carotids, no JVD, no bruits.  LUNGS:  Clear.  HEART:  Regular with no significant murmurs, gallops, rubs.  ABDOMEN:  Soft, nontender, nondistended.  No rebound or guarding.  EXTREMITIES:  Warm with 2+ pulses and no significant edema.   Her EKG shows sinus bradycardia, rate of 59 per minute, normal axis with  nonspecific ST-T wave changes with T-wave inversions in her anterior  precordia leads.  She also has normalization of her interior T-wave  changes since her bypass surgery done in June 2007.   PLAN:  At this time the patient will be admitted to the hospital for  further evaluation.  Her  symptoms are worrisome for me.  Her EKG shows  nonspecific ST-T wave changes.  She will be given Lovenox 1 mg/kg subcu.  We will also restart her Toprol-XL 50 mg once daily, as well as  continued aspirin 325 mg daily.  She will be scheduled for serial  cardiac enzymes as well as chest x-ray and CT to rule out for pulmonary  embolus.  I would also like to start low-dose nitroglycerin patch to see  if this will help her with her symptoms.  We will also make GI cocktail  available if needed.  I discussed this with her and her husband at  length.      Elmore Guise., M.D.  Electronically Signed     TWK/MEDQ  D:  12/31/2006  T:  12/31/2006  Job:  [id number]

## 2010-11-15 NOTE — H&P (Signed)
NAMEICELA, GLYMPH             ACCOUNT NO.:  [id number]   MEDICAL RECORD NO.:  [id number]          PATIENT TYPE:  EMS   LOCATION:  MAJO                         FACILITY:  [facility]   PHYSICIAN:  Elmore Guise., M.D.DATE OF BIRTH:  June 18, 1951   DATE OF ADMISSION:  08/14/2008  DATE OF DISCHARGE:                              HISTORY & PHYSICAL   INDICATION FOR ADMISSION:  Increasing exertional chest pain, now chest  pain at rest, consistent with unstable angina.   HISTORY OF PRESENT ILLNESS:  Ms. Eilts is a very pleasant 60 year old  white female, past medical history of coronary artery disease (status  post two-vessel coronary artery bypass grafting, June 2007),  dyslipidemia (statin intolerant), hypothyroidism, seizure disorder who  presents with 1-week history of increasing chest pain.  The patient  reports a recent trip down to [place], [place].  While there, she was  increasing her activity and she noticed increasing substernal chest  pressure.  This would radiate to her left shoulder and neck area.  She  would sit down with relief.  She denies any nausea or diaphoresis with  her symptoms.  Her symptoms progressed while down in [place].  She cut  her trip short and came back to [facility] for evaluation.  Here she had  not had any problems with rest pain until early this morning.  This a.m.  she awoke at 5 o'clock with retrosternal chest pain with radiation to  her left shoulder and neck and going down the medial aspect of her left  arm.  This was associated with shortness of breath and nausea.  She took  a nitroglycerin with relief.  She has had no recent fever or cough.  No  orthopnea or PND.  She has had no claudication symptoms.  She is  currently resting comfortably without any further pain.   REVIEW OF SYSTEMS:  As per HPI.  All others are negative.   CURRENT MEDICATIONS:  1. Aspirin 81 mg daily.  2. Fish oil 1000 mg daily.  3. Zyrtec p.r.n.  4. Synthroid 50 mcg  daily.  5. Tegretol twice daily.  6. Nexium.  7. Zarontin.  8. Zetia.  9. Celexa.   ALLERGIES:  To ERYTHROMYCIN, IMDUR, LISINOPRIL, PENICILLIN, SULFA,  TETRACYCLINE.   FAMILY HISTORY:  Positive for heart disease.   SOCIAL HISTORY:  She is married, does continue to work, denies any  tobacco or alcohol.   EXAM:  VITAL SIGNS:  She is afebrile.  Temperature is 97.5, blood  pressure is 146/73, heart rate 76, respirations are 18.  She is satting  97% on room air.  IN GENERAL:  She is a very pleasant, middle-aged white female, alert and  oriented x4, in no acute distress.  HEENT:  Unremarkable.  NECK:  Supple.  No lymphadenopathy, 2+ carotids.  No JVD and no bruits.  No thyromegaly.  LUNGS:  Clear.  HEART:  Regular with normal S1-S2.  No murmurs, gallops or rubs.  She is  nontender in her sternal area.  ABDOMEN:  Soft, nontender, nondistended.  No rebound or guarding.  No  splenomegaly.  No abdominal bruits  noted.  EXTREMITIES:  Warm with 2+ femoral pulses and 2+ pedal pulses noted  bilaterally.  She has had no edema.  NEUROLOGICALLY:  Her strength is equal bilaterally.  She has no focal  deficits.  SKIN:  Warm and dry and without rashes.   Her EKG was reviewed and showed normal sinus rhythm, rate of 69 per  minute, normal axis, normal intervals, without significant ST or T-wave  changes.  Compared with her prior tracing, done January 01, 2007, no change.   Her blood work shows white blood cell count of 8.0, hemoglobin of 14.1,  platelet count of 284.  Coags are normal.  Myoglobin is 31.7, MB of less  than 1.0.  Troponin I less than 0.05.  Potassium was 4.2, BUN and  creatinine 14 and 0.58.  LFTs are normal.  Chest x-ray shows no acute  cardiopulmonary disease.   IMPRESSION:  1. Chest pain, consistent with unstable angina.  2. History of known coronary disease, status post two-vessel coronary      artery bypass grafting, June 2007.  LIMA to LAD and vein graft to      RCA.  3.  History of dyslipidemia, statin intolerant.  4. History of seizure disorder.   PLAN:  The patient will be admitted to telemetry monitoring.  We will  continue gentle hydration as tolerated.  She has been given aspirin.  We  will also have nitroglycerin available.  She will be cathed later today.  We will continue serial cardiac markers.  I plan to hold on  anticoagulation for the time-being unless her symptoms return until her  cath is complete.      Elmore Guise., M.D.  Electronically Signed     TWK/MEDQ  D:  08/14/2008  T:  08/14/2008  Job:  [id number]

## 2010-11-18 ENCOUNTER — Encounter (HOSPITAL_BASED_OUTPATIENT_CLINIC_OR_DEPARTMENT_OTHER): Payer: BC Managed Care – PPO | Admitting: Oncology

## 2010-11-18 DIAGNOSIS — IMO0001 Reserved for inherently not codable concepts without codable children: Secondary | ICD-10-CM

## 2010-11-18 DIAGNOSIS — F411 Generalized anxiety disorder: Secondary | ICD-10-CM

## 2010-11-18 DIAGNOSIS — E039 Hypothyroidism, unspecified: Secondary | ICD-10-CM

## 2010-11-18 DIAGNOSIS — D472 Monoclonal gammopathy: Secondary | ICD-10-CM

## 2010-11-18 NOTE — Op Note (Signed)
Rachel Clark, Rachel Clark             ACCOUNT NO.:  [id number]   MEDICAL RECORD NO.:  [id number]          PATIENT TYPE:  INP   LOCATION:  2315                         FACILITY:  [facility]   PHYSICIAN:  Salvatore Decent. Cornelius Moras, M.D. DATE OF BIRTH:  08/21/1950   DATE OF PROCEDURE:  12/21/2005  DATE OF DISCHARGE:                                 OPERATIVE REPORT   PREOPERATIVE DIAGNOSIS:  Severe two-vessel coronary artery disease with  class IV unstable angina.   POSTOPERATIVE DIAGNOSIS:  Severe two-vessel coronary artery disease with  class IV unstable angina.   PROCEDURE:  Median sternotomy for off-pump coronary artery bypass grafting  x2 (left internal mammary artery to distal left anterior descending coronary  artery, saphenous vein graft to distal right coronary artery, endoscopic  saphenous vein harvest from left thigh).   SURGEON:  Dr. Purcell Nails   ASSISTANT:  Mr. Gershon Crane.   ANESTHESIA:  General.   BRIEF CLINICAL NOTE:  The patient is a 60 year old female who originally  presented with an acute myocardial infarction and March 2007.  She underwent  percutaneous coronary intervention and stenting of the right coronary artery  at that time.  The patient soon thereafter developed recurrent symptoms of  unstable angina.  Attempts were made to treat this medically but repeat  cardiac catheterization demonstrates severe two-vessel coronary artery  disease with high-grade stenosis of the left anterior descending coronary  artery as well as some residual stenosis in the right coronary artery.  Left  ventricular function is preserved.  Coronary anatomy is relatively  unfavorable for percutaneous coronary intervention.  The patient and her  family have been counseled at length regarding the indications, risks, and  potential benefits of surgery.  Alternative treatment strategies have been  discussed.  They understand and accept all associated risks of surgery and  desire to proceed as  described.   OPERATIVE FINDINGS:  1.  Normal left ventricular function.  2.  Good-quality left internal mammary artery and saphenous vein conduit.  3.  Diffusely diseased left anterior descending coronary artery with mild      disease in the distal right coronary artery   OPERATIVE NOTE IN DETAIL:  The patient is brought to the operating room on  the above-mentioned date and central monitoring was established by the  anesthesia service under the care and direction of Dr. Autumn Patty.  Specifically, a Swan-Ganz catheter is placed through the right internal  jugular approach.  A radial arterial line was placed.  Intravenous  antibiotics were administered.  Following induction with general  endotracheal anesthesia, a Foley catheter was placed.  The patient's chest,  abdomen, both groins, and both lower extremities were prepared, draped in  sterile manner.  Baseline transesophageal echocardiogram was performed by  Dr. Sampson Goon.  This demonstrates normal left ventricular function.  The  mitral valve and the aortic valve appear normal.  No significant  abnormalities are appreciated.   A median sternotomy incision is performed and left internal mammary artery  is dissected from the chest wall and prepared for bypass grafting.  The left  internal mammary artery is good-quality  conduit although somewhat delicate.  Simultaneously saphenous vein is obtained from the patient's left thigh  using endoscopic vein harvest technique.  Initially a small incision is made  in the right thigh just above the right knee but the greater saphenous vein  cannot be localized on the right side.  Subsequently the saphenous vein is  obtained from the left thigh through a small incision made just above the  left knee.  After the saphenous vein is removed, the small surgical  incisions in both lower extremities are closed in multiple layers with  running absorbable suture.  The saphenous vein is good-quality  conduit.   The patient is heparinized systemically.  The left internal mammary artery  is transected distally and has excellent flow.  The pericardium was opened.  The ascending aorta is normal in appearance.  The Guidant Acrobat  stabilization system is utilized to facilitate off-pump coronary artery  bypass grafting.  The apical suction cup and suction horseshoe stabilizing  bar are both utilized.  Elastic vessel loops were used proximally and  distally for hemostasis.  Intracoronary shunts were not necessary.   The following distal coronary anastomoses were performed:  1.  The distal right coronary artery is grafted with a saphenous vein graft      in end-to-side fashion.  This vessel measures 2.0 mm in diameter and is      good quality at the site of distal bypass.  2.  The distal left anterior descending coronary artery is grafted with left      internal mammary artery in end-to-side fashion.  This vessel is      intramyocardial proximally.  It is diffusely diseased.  At the site of      distal bypass it measures 1.4 mm in diameter and is a fair quality      target with some plaque throughout the vessel.   The patient tolerated off-pump coronary bypass grafting quite well with no  bradycardia nor significant hypotension.  The single proximal saphenous vein  anastomoses is performed directly to the ascending aorta under partial  occlusion clamp.  Protamine is administered to reverse anticoagulation.  The  mediastinum and left chest are irrigated with saline solution containing  vancomycin.  Meticulous surgical hemostasis ascertained.  The mediastinum  and the left chest are drained using three chest tubes exited through  separate stab incisions inferiorly.  The soft tissues anterior to the aorta  are reapproximated loosely.  The patient was noted to have mild persistent coagulopathy that was present both prior to heparinization and after  reversal of heparinization with protamine.   The patient is transfused one 10  pack of adult platelets due to a history of Plavix administration prior to  surgery.   The On-Q continuous pain management system is utilized for postoperative  pain control.  Two 10-inch Silastic catheters supplied with the On-Q kit are  tunneled into the deep subcutaneous tissues and positioned just lateral to  the lateral border of the sternum on either side.  Each catheter was flushed  with 5 mL of 0.5% bupivacaine solution and ultimately connected to a  continuous infusion pump.  The sternum is closed using double-strength  sternal wire.  The soft tissues anterior to the sternum are closed in  multiple layers and the skin is closed with a running subcuticular skin  closure.   The patient tolerated the procedure well and was transported to the surgical  intensive care unit in stable condition.  There are no  intraoperative  complications.  All sponge, instrument and needle counts verified correct at  completion of the operation.      Salvatore Decent. Cornelius Moras, M.D.  Electronically Signed     CHO/MEDQ  D:  12/21/2005  T:  12/21/2005  Job:  [id number]   cc:   Peter M. Swaziland, M.D.  Fax: [phone]   Elmore Guise., M.D.  Fax: [phone]   Sharlet Salina, M.D.  Fax: [phone]

## 2010-11-18 NOTE — Consult Note (Signed)
NAMEWILLEEN, Rachel Clark             ACCOUNT NO.:  [id number]   MEDICAL RECORD NO.:  [id number]          PATIENT TYPE:  INP   LOCATION:  2315                         FACILITY:  [facility]   PHYSICIAN:  Zenon Mayo, MDDATE OF BIRTH:  12-05-1950   DATE OF CONSULTATION:  12/21/2005  DATE OF DISCHARGE:                                   CONSULTATION   PROCEDURE:  Intraoperative transesophageal echocardiogram.   INDICATIONS:  Evaluation of left ventricular function.   DESCRIPTION:  [name] is a 60 year old female who was found to have  obstructive coronary artery disease by catheterization and was brought to  the operating room today by Dr. Cornelius Moras for coronary artery bypass graft.  Intraoperative transesophageal echocardiogram was requested to evaluate left  ventricular function as well as any valvular abnormalities.  The patient was  brought to the operating room and placed under general anesthesia.  After  confirmation of endotracheal tube placement, an esophageal echo probe was  placed in the patient's esophagus without complication.  The four-chamber  view of the heart was imaged first and revealed no pericardial or pleural  effusion.  The left ventricle appeared minimally hypertrophied with no  regional wall motion abnormalities.  The ejection fraction was estimated to  be 60%.  The mitral valve was then imaged.  The mitral valve was free from  calcification or vegetations.  The leaflets were pliable and appeared to  coapt well.  When color Doppler was placed across the valve, trace mitral  regurgitation was seen.  The aortic valve was imaged next and revealed a  trileaflet valve that was free from calcifications or vegetations.  Aortic  valve area was 3.0 sq cm.  There was no aortic insufficiency noted.  The  tricuspid valve was normal in structure and appearance.  The pulmonic valve  was also normal in structure and function.  There was no interatrial septal  defect noted with  color.  The thoracic aorta was free from atherosclerotic  disease.   The bypass surgery was able to be completed off-pump, and at the conclusion  of the procedure, the heart was again imaged.  All structures and functions  of the heart remained unchanged from pre-bypass values.  The patient  tolerated the procedure well and was hemodynamically stable throughout.  No  inotropes were needed.  At the conclusion of the procedure, the echo probe  was removed from the patient's esophagus without complication.  The patient  was taken directly from the operating room to the intensive care unit in a  stable condition.           ______________________________  Zenon Mayo, MD     WEF/MEDQ  D:  12/21/2005  T:  12/21/2005  Job:  [id number]

## 2010-11-18 NOTE — Discharge Summary (Signed)
NAMESKIE, VITRANO             ACCOUNT NO.:  [id number]   MEDICAL RECORD NO.:  [id number]          PATIENT TYPE:  INP   LOCATION:  3706                         FACILITY:  [facility]   PHYSICIAN:  Elmore Guise., M.D.DATE OF BIRTH:  Nov 29, 1950   DATE OF ADMISSION:  08/14/2008  DATE OF DISCHARGE:  08/15/2008                               DISCHARGE SUMMARY   DISCHARGE DIAGNOSES:  1. History of known coronary artery disease, status post coronary      artery bypass grafting.  2. Catheterization showing native vessel disease with patent vein      graft to distal right coronary artery, patent left internal mammary      artery to left anterior descending.  3. Dyslipidemia (STATIN intolerant).  4. Hypothyroidism.  5. Seizure disorder.   HISTORY OF PRESENT ILLNESS:  Ms. Leis is a pleasant 60 year old white  female who presented with increasing exertional chest pain.  She was  admitted for further evaluation and treatment.   HOSPITAL COURSE:  The patient's hospital course was uncomplicated.  Because of her symptoms, she was referred for cardiac catheterization.  She tolerated this procedure well.  This showed normal left main.  LAD  with moderate-sized vessel with moderate diffuse disease.  Competitive  flow was noted in the midvessel from her LIMA.  First diagonal was a  small vessel with moderate proximal disease.  Circumflex had mild  luminal irregularities.  Right coronary artery had a patent proximal  stent with 50-60% stenosis after the stent.  The mid vessel bifurcates  with moderate disease noted down each branch.  PDA had mild diffuse  disease.  Vein graft is seen with patent and good forward back to the  proximal portion of the vein graft by injecting into the native RCA and  injecting into the vein graft itself.  LIMA to LAD was patent with good  flow to the mid and distal vessel and back flow noted to the proximal  vessel and first diagonal.  Her EF was 65%.  She had no  wall motion  abnormalities.  Her post-cath course was uncomplicated.  She was to be  treated medically.  She has been up and ambulatory with no significant  problems.  She will be discharged home today to continue the following  medications:  1. Aspirin 81 mg daily.  2. Fish oil 1000 mg daily.  3. Zyrtec p.r.n.  4. Synthroid 50 mcg daily.  5. Tegretol twice daily.  6. Nexium once daily.  7. Zarontin as before.  8. Zetia 10 mg daily.  9. Celexa once daily.  10.Effient was added to her medications as well as Ranexa 500 mg once      daily.   Her followup appointment will be with Dr. Reyes Ivan in 2 weeks.  She is to  call the office if she has any further problems or concerns.      Elmore Guise., M.D.  Electronically Signed     TWK/MEDQ  D:  09/28/2008  T:  09/29/2008  Job:  [id number]

## 2010-12-23 ENCOUNTER — Encounter: Payer: Self-pay | Admitting: Cardiology

## 2010-12-30 ENCOUNTER — Encounter: Payer: Self-pay | Admitting: Cardiology

## 2010-12-30 ENCOUNTER — Ambulatory Visit (INDEPENDENT_AMBULATORY_CARE_PROVIDER_SITE_OTHER): Payer: BC Managed Care – PPO | Admitting: Cardiology

## 2010-12-30 VITALS — BP 130/76 | HR 86 | Ht 69.0 in | Wt 197.0 lb

## 2010-12-30 DIAGNOSIS — I2510 Atherosclerotic heart disease of native coronary artery without angina pectoris: Secondary | ICD-10-CM | POA: Insufficient documentation

## 2010-12-30 DIAGNOSIS — E785 Hyperlipidemia, unspecified: Secondary | ICD-10-CM | POA: Insufficient documentation

## 2010-12-30 MED ORDER — CLOPIDOGREL BISULFATE 75 MG PO TABS: 75.0000 mg | ORAL_TABLET | Freq: Every day | ORAL | Status: DC

## 2010-12-30 NOTE — Assessment & Plan Note (Signed)
Given her aspirin intolerance I recommended she go on Plavix 75 mg daily. She will continue with her current therapy and risk factor modification.

## 2010-12-30 NOTE — Progress Notes (Signed)
Rachel Clark Date of Birth: Jul 17, 1950   History of Present Illness: Beautiful Pensyl for followup of her coronary disease. She states she is feeling very well. She has had no significant anginal symptoms, shortness of breath, or palpitations. She is walking daily. She does report that she is now taking vitamin D. She has been intolerant of aspirin because of gastric upset and is currently taking no antiplatelet therapy. She has a history of intolerance to statins and niacin. She reports her last cholesterol was 265 with an HDL of 65.  Current Outpatient Prescriptions on File Prior to Visit  Medication Sig Dispense Refill  . albuterol (PROVENTIL) (2.5 MG/3ML) 0.083% nebulizer solution Take 2.5 mg by nebulization every 6 (six) hours as needed.        . Azelastine HCl (ASTEPRO) 0.15 % SOLN Place into the nose as needed.       . carbamazepine (TEGRETOL XR) 100 MG 12 hr tablet Take 500 mg by mouth 2 (two) times daily.        . cetirizine (ZYRTEC) 10 MG tablet Take 10 mg by mouth as needed.        . DULoxetine (CYMBALTA) 60 MG capsule Take 60 mg by mouth daily.        Marland Kitchen ethosuximide (ZARONTIN) 250 MG capsule Take 1,500 mg by mouth daily.        Marland Kitchen ezetimibe (ZETIA) 10 MG tablet Take 10 mg by mouth daily.        . fish oil-omega-3 fatty acids 1000 MG capsule Take 1 g by mouth every other day.        . fluticasone (FLOVENT DISKUS) 50 MCG/BLIST diskus inhaler Inhale 1 puff into the lungs 2 (two) times daily.        Marland Kitchen levothyroxine (SYNTHROID, LEVOTHROID) 50 MCG tablet Take 50 mcg by mouth daily.        Marland Kitchen DISCONTD: aluminum & magnesium hydroxide (MAALOX) 225-200 MG/5ML suspension Take by mouth every 6 (six) hours as needed.        Marland Kitchen DISCONTD: aspirin 81 MG tablet Take 81 mg by mouth daily.          Allergies  Allergen Reactions  . Aspirin Other (See Comments)    GI upset  . Erythromycin   . Imdur (Isosorbide Mononitrate)   . Lisinopril   . Niacin And Related   . Penicillins   . Statins     . Sulfa Drugs Cross Reactors   . Tetracyclines & Related     Past Medical History  Diagnosis Date  . Coronary artery disease     MODERATE DISEASE IN A VERY SMALL DIAGONAL BRANCH  . Dyslipidemia   . Seizure disorder   . GERD (gastroesophageal reflux disease)   . History of fibromyalgia   . Chest pain   . Monoclonal gammopathy of unknown significance   . Hypothyroid     Past Surgical History  Procedure Date  . Cardiac catheterization 05/20/2009    normal. ef 60%  . Coronary artery bypass graft     SAPHENOUS VEIN GRAFT TO THE DISTAL RIGHT CORONARY IN AN LIMA GRAFT TO THE LAD.  Marland Kitchen Partial hysterectomy   . Posterior laminectomy / decompression cervical spine     History  Smoking status  . Former Smoker -- 0.3 packs/day for 20 years  . Types: Cigarettes  . Quit date: 12/23/1990  Smokeless tobacco  . Never Used    History  Alcohol Use No    Family History  Problem Relation Age of Onset  . Heart attack Father   . Heart attack Brother 40    Review of Systems: The review of systems is positive for intolerance to aspirin.  All other systems were reviewed and are negative.  Physical Exam: BP 130/76  Pulse 86  Ht 5\' 9"  (1.753 m)  Wt 197 lb (89.359 kg)  BMI 29.09 kg/m2 She is a pleasant white female in no acute distress. She normocephalic, atraumatic. Pupils equal round and reactive to light and accommodation. Extraocular movements are full. Sclera clear. Oropharynx is clear. Neck is supple no JVD, adenopathy, thyromegaly, or bruits. Lungs are cardiac exam reveals a regular rate and rhythm without gallop, murmur, or click. Abdomen is soft and nontender. Femoral and pedal pulses are palpable. She has no edema. Skin is warm and dry. She is alert and oriented x3. Cranial nerves II through XII are intact. LABORATORY DATA:   Assessment / Plan:

## 2010-12-30 NOTE — Patient Instructions (Signed)
We will add Plavix 75 mg daily instead of ASA  Continue your current therapy.  Keep up the exercise.  I will see you back in 6 months.

## 2010-12-30 NOTE — Assessment & Plan Note (Signed)
Her lipids are suboptimal but she is intolerant to statins and niacin. She will continue with Zetia and Fish oil supplementation. Continue efforts at weight loss.

## 2011-03-15 ENCOUNTER — Inpatient Hospital Stay (INDEPENDENT_AMBULATORY_CARE_PROVIDER_SITE_OTHER)
Admission: RE | Admit: 2011-03-15 | Discharge: 2011-03-15 | Disposition: A | Discharge status: Home / Self Care | Payer: BC Managed Care – PPO | Source: Ambulatory Visit | Attending: Family Medicine | Admitting: Family Medicine

## 2011-03-15 DIAGNOSIS — S91309A Unspecified open wound, unspecified foot, initial encounter: Secondary | ICD-10-CM

## 2011-03-28 ENCOUNTER — Other Ambulatory Visit: Payer: Self-pay | Admitting: Oncology

## 2011-03-28 ENCOUNTER — Encounter (HOSPITAL_BASED_OUTPATIENT_CLINIC_OR_DEPARTMENT_OTHER): Payer: BC Managed Care – PPO | Admitting: Oncology

## 2011-03-28 DIAGNOSIS — IMO0001 Reserved for inherently not codable concepts without codable children: Secondary | ICD-10-CM

## 2011-03-28 DIAGNOSIS — D472 Monoclonal gammopathy: Secondary | ICD-10-CM

## 2011-03-28 DIAGNOSIS — F411 Generalized anxiety disorder: Secondary | ICD-10-CM

## 2011-03-28 DIAGNOSIS — E039 Hypothyroidism, unspecified: Secondary | ICD-10-CM

## 2011-03-28 LAB — CBC WITH DIFFERENTIAL/PLATELET
BASO%: 0.5 % (ref 0.0–2.0)
EOS%: 4.9 % (ref 0.0–7.0)
HCT: 36.6 % (ref 34.8–46.6)
LYMPH%: 36.1 % (ref 14.0–49.7)
MCH: 30.4 pg (ref 25.1–34.0)
MCHC: 34.2 g/dL (ref 31.5–36.0)
MCV: 89 fL (ref 79.5–101.0)
MONO#: 0.6 10*3/uL (ref 0.1–0.9)
NEUT%: 50.1 % (ref 38.4–76.8)
Platelets: 281 10*3/uL (ref 145–400)

## 2011-03-30 LAB — COMPREHENSIVE METABOLIC PANEL
ALT: 12 U/L (ref 0–35)
Alkaline Phosphatase: 109 U/L (ref 39–117)
Sodium: 136 mEq/L (ref 135–145)
Total Bilirubin: 0.2 mg/dL — ABNORMAL LOW (ref 0.3–1.2)
Total Protein: 6.8 g/dL (ref 6.0–8.3)

## 2011-03-30 LAB — PROTEIN ELECTROPHORESIS, SERUM
Alpha-2-Globulin: 12.1 % — ABNORMAL HIGH (ref 7.1–11.8)
Beta 2: 4.9 % (ref 3.2–6.5)
Beta Globulin: 5.2 % (ref 4.7–7.2)
M-Spike, %: 0.49 g/dL

## 2011-04-07 ENCOUNTER — Encounter (HOSPITAL_BASED_OUTPATIENT_CLINIC_OR_DEPARTMENT_OTHER): Payer: BC Managed Care – PPO | Admitting: Oncology

## 2011-04-07 DIAGNOSIS — E039 Hypothyroidism, unspecified: Secondary | ICD-10-CM

## 2011-04-07 DIAGNOSIS — D649 Anemia, unspecified: Secondary | ICD-10-CM

## 2011-04-07 DIAGNOSIS — N289 Disorder of kidney and ureter, unspecified: Secondary | ICD-10-CM

## 2011-04-07 DIAGNOSIS — D472 Monoclonal gammopathy: Secondary | ICD-10-CM

## 2011-04-07 DIAGNOSIS — Z23 Encounter for immunization: Secondary | ICD-10-CM

## 2011-04-13 DIAGNOSIS — D472 Monoclonal gammopathy: Secondary | ICD-10-CM | POA: Insufficient documentation

## 2011-04-13 DIAGNOSIS — E039 Hypothyroidism, unspecified: Secondary | ICD-10-CM | POA: Insufficient documentation

## 2011-04-18 LAB — CARDIAC PANEL(CRET KIN+CKTOT+MB+TROPI)
CK, MB: 0.9
CK, MB: 0.9
Total CK: 33
Troponin I: 0.04

## 2011-04-19 LAB — CBC
HCT: 38.8
Hemoglobin: 12.8
MCHC: 33
RBC: 4.29
RDW: 13.2

## 2011-04-19 LAB — COMPREHENSIVE METABOLIC PANEL
Alkaline Phosphatase: 93
BUN: 14
CO2: 26
GFR calc non Af Amer: >60
Glucose, Bld: 95
Potassium: 4.5
Total Protein: 6.7

## 2011-04-19 LAB — CARDIAC PANEL(CRET KIN+CKTOT+MB+TROPI): CK, MB: 1

## 2011-04-19 LAB — PROTIME-INR: Prothrombin Time: 13.6

## 2011-06-09 ENCOUNTER — Telehealth: Payer: Self-pay | Admitting: Cardiology

## 2011-06-09 NOTE — Telephone Encounter (Signed)
New problem Pt has been getting dull pains in chest, it comes and goes,  no sob, over last several days please call

## 2011-06-09 NOTE — Telephone Encounter (Signed)
I reviewed the patient's symptoms with Dr. Swaziland. He did not feel the patient needed to be seen urgently. I spoke with the patient and advised her that Dr. Elvis Coil schedule is full next week, and with her symptoms, I did not want to push her out too far. She will see Norma Fredrickson, NP on 12/12 at 10:30am- Dr. Swaziland will be in the office that day. She is agreeable. I have advised that she report to the ER should her symptoms worsen. She voices understanding.

## 2011-06-09 NOTE — Telephone Encounter (Signed)
I spoke with the patient. She has developed chest heaviness/ dull pain with SOB that has been going on for about a week. She thinks this may be getting a little worse and lasting a little longer. She does not notice that the discomfort is worse with movement. She has a history of MI with stents and bypass. She states that with her MI she had sharp pain. She had stents placed at this time. With her bypass, she states she wasn't getting any better and did have similar symptoms to what she is having now. She states she was just wanting to see Dr. Swaziland, but didn't think she needed to see him today. I explained I will review with Dr. Swaziland and call her back today. She is agreeable.

## 2011-06-14 ENCOUNTER — Ambulatory Visit
Admission: RE | Admit: 2011-06-14 | Discharge: 2011-06-14 | Disposition: A | Discharge status: Home / Self Care | Payer: BC Managed Care – PPO | Source: Ambulatory Visit | Attending: Nurse Practitioner | Admitting: Nurse Practitioner

## 2011-06-14 ENCOUNTER — Ambulatory Visit (INDEPENDENT_AMBULATORY_CARE_PROVIDER_SITE_OTHER): Payer: BC Managed Care – PPO | Admitting: Nurse Practitioner

## 2011-06-14 ENCOUNTER — Encounter: Payer: Self-pay | Admitting: Nurse Practitioner

## 2011-06-14 DIAGNOSIS — R079 Chest pain, unspecified: Secondary | ICD-10-CM | POA: Insufficient documentation

## 2011-06-14 MED ORDER — EZETIMIBE 10 MG PO TABS: 10.0000 mg | ORAL_TABLET | Freq: Every day | ORAL | Status: DC

## 2011-06-14 NOTE — Assessment & Plan Note (Signed)
Her symptoms now seem more pleuritic in nature. We will check a CXR today. I have asked her to use some Tylenol or Advil. She is to let us know in a week or so if her symptoms persist. If so, we will need to do further testing. I have given her a follow up visit with Dr. Swaziland in a month that will be her regular appointment time. Patient is agreeable to this plan and will call if any problems develop in the interim.

## 2011-06-14 NOTE — Progress Notes (Signed)
Rachel Clark Date of Birth: [birth year] Medical Record #[id number]  History of Present Illness: Rachel Clark is seen today for a work in visit. She is seen for Dr. Swaziland. She has a known history of CAD with prior CABG x 2 in 2007. Last cath in 2010 and medical therapy was recommended. She does have known moderate disease in a very small diagonal branch.  Last stress test dates back to 2007. She is not able to take aspirin due to GI upset. No statin therapy on board due to intolerance.   She comes in today due to recurrent episodes of chest pain. She called in last Friday to report that she has developed chest heaviness/dull pain with dyspnea that has been going for the past week. May be getting a little worse and lasting a little longer. She may have had some radiation down her left arm, but now thinks she was imagining that. At her visit today she says that she is feeling better this week. The discomfort that she had last week occurred at rest and was not really exertional. She noted that it was worse with taking a deep breath. She did have a cold a few weeks ago. She does not use NTG. She feels better this week. She was not able to take the Plavix that Dr. Swaziland gave her 6 months ago.   Current Outpatient Prescriptions on File Prior to Visit  Medication Sig Dispense Refill  . albuterol (PROVENTIL) (2.5 MG/3ML) 0.083% nebulizer solution Take 2.5 mg by nebulization every 6 (six) hours as needed.        . Azelastine HCl (ASTEPRO) 0.15 % SOLN Place into the nose as needed.       . carbamazepine (TEGRETOL XR) 100 MG 12 hr tablet Take 500 mg by mouth 2 (two) times daily.        . cetirizine (ZYRTEC) 10 MG tablet Take 10 mg by mouth as needed.        . DULoxetine (CYMBALTA) 60 MG capsule Take 60 mg by mouth daily.        Marland Kitchen ethosuximide (ZARONTIN) 250 MG capsule Take 1,500 mg by mouth daily.        Marland Kitchen ezetimibe (ZETIA) 10 MG tablet Take 10 mg by mouth daily.        . fish oil-omega-3 fatty acids  1000 MG capsule Take 1 g by mouth every other day.        . fluticasone (FLOVENT DISKUS) 50 MCG/BLIST diskus inhaler Inhale 1 puff into the lungs as needed.       Marland Kitchen levothyroxine (SYNTHROID, LEVOTHROID) 50 MCG tablet Take 50 mcg by mouth daily.          Allergies  Allergen Reactions  . Aspirin Other (See Comments)    GI upset  . Erythromycin   . Imdur (Isosorbide Mononitrate)   . Lisinopril   . Niacin And Related   . Penicillins   . Statins   . Sulfa Drugs Cross Reactors   . Tetracyclines & Related     Past Medical History  Diagnosis Date  . Coronary artery disease     Has had remote NSTEMI with PCI to the RCA; S/P CABG in 2007; LAST CATH WITH MODERATE DISEASE IN A VERY SMALL DIAGONAL BRANCH; SHE IS MANAGED MEDICALLY  . Dyslipidemia   . Seizure disorder   . GERD (gastroesophageal reflux disease)   . History of fibromyalgia   . Chest pain   . Monoclonal gammopathy of unknown significance   .  Hypothyroid   . Aspirin intolerance   . Statin intolerance     Past Surgical History  Procedure Date  . Cardiac catheterization 05/20/2009    normal. ef 60%  . Coronary artery bypass graft     SAPHENOUS VEIN GRAFT TO THE DISTAL RIGHT CORONARY IN AN LIMA GRAFT TO THE LAD.  Marland Kitchen Partial hysterectomy   . Posterior laminectomy / decompression cervical spine     History  Smoking status  . Former Smoker -- 0.3 packs/day for 20 years  . Types: Cigarettes  . Quit date: 12/23/1990  Smokeless tobacco  . Never Used    History  Alcohol Use No    Family History  Problem Relation Age of Onset  . Heart attack Father   . Heart attack Brother 40    Review of Systems: The review of systems is per the HPI.  All other systems were reviewed and are negative.  Physical Exam: BP 120/70  Pulse 74  Ht 5\' 9"  (1.753 m)  Wt 193 lb (87.544 kg)  BMI 28.50 kg/m2 Patient is very pleasant and in no acute distress. Skin is warm and dry. Color is normal.  HEENT is unremarkable.  Normocephalic/atraumatic. PERRL. Sclera are nonicteric. Neck is supple. No masses. No JVD. Lungs are clear. She reports discomfort with deep breath and has some palpable chest wall pain. Cardiac exam shows a regular rate and rhythm. Abdomen is soft. Extremities are without edema. Gait and ROM are intact. No gross neurologic deficits noted.   LABORATORY DATA: EKG today shows sinus rhythm with nonspecific changes. Tracing is reviewed with Dr. Swaziland. It is felt to be unchanged.   Assessment / Plan:

## 2011-06-14 NOTE — Patient Instructions (Signed)
We are going to send you for a chest Xray today.  You may take some Tylenol or Advil for discomfort.  Let us know if you are not better in about a week. If you continue to have symptoms you need to call us - we will need to do more tests.  Stay on your other medicines.   We will see you back in a month with Dr. Swaziland.

## 2011-06-15 ENCOUNTER — Telehealth: Payer: Self-pay | Admitting: *Deleted

## 2011-06-15 NOTE — Telephone Encounter (Signed)
Notified of chest xray results. Will send copy to Dr. Haynes Dage.

## 2011-06-15 NOTE — Telephone Encounter (Signed)
Message copied by Lorayne Bender on Thu Jun 15, 2011  9:44 AM ------      Message from: Rosalio Macadamia      Created: Wed Jun 14, 2011  1:09 PM       Ok to report. CXR is ok.

## 2011-07-14 ENCOUNTER — Other Ambulatory Visit: Payer: Self-pay

## 2011-07-14 MED ORDER — EZETIMIBE 10 MG PO TABS: 10.0000 mg | ORAL_TABLET | Freq: Every day | ORAL | Status: DC

## 2011-07-26 ENCOUNTER — Telehealth: Payer: Self-pay | Admitting: Oncology

## 2011-07-26 NOTE — Telephone Encounter (Signed)
Talked to pt , gave her appt for 09/05/11 lab and MD 09/11/11, pt aware of appt

## 2011-07-28 ENCOUNTER — Encounter: Payer: Self-pay | Admitting: Cardiology

## 2011-07-28 ENCOUNTER — Ambulatory Visit (INDEPENDENT_AMBULATORY_CARE_PROVIDER_SITE_OTHER): Payer: BC Managed Care – PPO | Admitting: Cardiology

## 2011-07-28 VITALS — BP 128/80 | HR 80 | Ht 69.0 in | Wt 193.0 lb

## 2011-07-28 DIAGNOSIS — I2510 Atherosclerotic heart disease of native coronary artery without angina pectoris: Secondary | ICD-10-CM

## 2011-07-28 DIAGNOSIS — E785 Hyperlipidemia, unspecified: Secondary | ICD-10-CM

## 2011-07-28 MED ORDER — PRASUGREL HCL 10 MG PO TABS: 10.0000 mg | ORAL_TABLET | Freq: Every day | ORAL | Status: DC

## 2011-07-28 NOTE — Assessment & Plan Note (Signed)
She reports that her primary care checked her lipid levels. She is currently on Zetia and fish oil.

## 2011-07-28 NOTE — Assessment & Plan Note (Signed)
She is asymptomatic today. Her last ischemic evaluation was with cardiac catheterization in November of 2010. It is unclear why she quit taking Plavix. She is aspirin intolerant. I recommended starting her on Effient 10 mg daily. I will followup again in 6 months.

## 2011-07-28 NOTE — Patient Instructions (Signed)
Take Effient 10 mg daily for antiplatelet effects.   Continue your other medication

## 2011-07-28 NOTE — Progress Notes (Signed)
Rachel Clark Date of Birth: [birth year] Medical Record #[id number]  History of Present Illness: Rachel Clark is seen today for a followup visit.  She has a known history of CAD with prior CABG x 2 in 2007. Last cath in 2010 and medical therapy was recommended. She does have known moderate disease in a very small diagonal branch.  Last stress test dates back to 2007. She is not able to take aspirin due to GI upset. No statin therapy on board due to intolerance. She was placed on Plavix previously but quit taking this do to some type of intolerance. She cannot really remember why. On followup today she is feeling very well. She has had no chest pain, shortness of breath, or palpitations. She is active. She states she always does best in the winter when the air is cold.   Current Outpatient Prescriptions on File Prior to Visit  Medication Sig Dispense Refill  . albuterol (PROVENTIL) (2.5 MG/3ML) 0.083% nebulizer solution Take 2.5 mg by nebulization every 6 (six) hours as needed.        . Azelastine HCl (ASTEPRO) 0.15 % SOLN Place into the nose as needed.       . carbamazepine (TEGRETOL XR) 100 MG 12 hr tablet Take 500 mg by mouth 2 (two) times daily.        . cetirizine (ZYRTEC) 10 MG tablet Take 10 mg by mouth as needed.        . DULoxetine (CYMBALTA) 60 MG capsule Take 60 mg by mouth daily.        Marland Kitchen ethosuximide (ZARONTIN) 250 MG capsule Take 1,500 mg by mouth daily.        Marland Kitchen ezetimibe (ZETIA) 10 MG tablet Take 1 tablet (10 mg total) by mouth daily.  30 tablet  4  . fish oil-omega-3 fatty acids 1000 MG capsule Take 1 g by mouth every other day.        . fluticasone (FLOVENT DISKUS) 50 MCG/BLIST diskus inhaler Inhale 1 puff into the lungs as needed.       Marland Kitchen levothyroxine (SYNTHROID, LEVOTHROID) 50 MCG tablet Take 50 mcg by mouth daily.          Allergies  Allergen Reactions  . Aspirin Other (See Comments)    GI upset  . Erythromycin   . Imdur (Isosorbide Mononitrate)   . Lisinopril     . Niacin And Related   . Penicillins   . Statins   . Sulfa Drugs Cross Reactors   . Tetracyclines & Related     Past Medical History  Diagnosis Date  . Coronary artery disease     Has had remote NSTEMI with PCI to the RCA; S/P CABG in 2007; LAST CATH WITH MODERATE DISEASE IN A VERY SMALL DIAGONAL BRANCH; SHE IS MANAGED MEDICALLY  . Dyslipidemia   . Seizure disorder   . GERD (gastroesophageal reflux disease)   . History of fibromyalgia   . Chest pain   . Monoclonal gammopathy of unknown significance   . Hypothyroid   . Aspirin intolerance   . Statin intolerance     Past Surgical History  Procedure Date  . Cardiac catheterization 05/20/2009    normal. ef 60%  . Coronary artery bypass graft     SAPHENOUS VEIN GRAFT TO THE DISTAL RIGHT CORONARY IN AN LIMA GRAFT TO THE LAD.  Marland Kitchen Partial hysterectomy   . Posterior laminectomy / decompression cervical spine     History  Smoking status  . Former Smoker --  0.3 packs/day for 20 years  . Types: Cigarettes  . Quit date: 12/23/1990  Smokeless tobacco  . Never Used    History  Alcohol Use No    Family History  Problem Relation Age of Onset  . Heart attack Father   . Heart attack Brother 40    Review of Systems: The review of systems is per the HPI.  All other systems were reviewed and are negative.  Physical Exam: BP 128/80  Pulse 80  Ht 5\' 9"  (1.753 m)  Wt 193 lb (87.544 kg)  BMI 28.50 kg/m2 Patient is very pleasant and in no acute distress. Skin is warm and dry. Color is normal.  HEENT is unremarkable. Normocephalic/atraumatic. PERRL. Sclera are nonicteric. Neck is supple. No masses. No JVD. Lungs are clear. She reports discomfort with deep breath and has some palpable chest wall pain. Cardiac exam shows a regular rate and rhythm. Abdomen is soft. Extremities are without edema. Gait and ROM are intact. No gross neurologic deficits noted.   LABORATORY DATA:   Assessment / Plan:

## 2011-08-03 ENCOUNTER — Other Ambulatory Visit: Payer: Self-pay

## 2011-08-03 MED ORDER — EZETIMIBE 10 MG PO TABS: 10.0000 mg | ORAL_TABLET | Freq: Every day | ORAL | Status: DC

## 2011-08-11 ENCOUNTER — Telehealth: Payer: Self-pay | Admitting: Oncology

## 2011-08-11 NOTE — Telephone Encounter (Signed)
per Dr. Gaylyn Rong called pt and r/s appt on 03/11 to 03/18

## 2011-08-16 ENCOUNTER — Observation Stay (HOSPITAL_BASED_OUTPATIENT_CLINIC_OR_DEPARTMENT_OTHER)
Admission: EM | Admit: 2011-08-16 | Discharge: 2011-08-17 | DRG: 143 | Disposition: A | Discharge status: Home / Self Care | Payer: BC Managed Care – PPO | Source: Ambulatory Visit | Attending: Cardiology | Admitting: Cardiology

## 2011-08-16 ENCOUNTER — Emergency Department (INDEPENDENT_AMBULATORY_CARE_PROVIDER_SITE_OTHER): Payer: BC Managed Care – PPO

## 2011-08-16 ENCOUNTER — Other Ambulatory Visit: Payer: Self-pay

## 2011-08-16 ENCOUNTER — Encounter (HOSPITAL_BASED_OUTPATIENT_CLINIC_OR_DEPARTMENT_OTHER): Payer: Self-pay | Admitting: Family Medicine

## 2011-08-16 DIAGNOSIS — R0602 Shortness of breath: Secondary | ICD-10-CM | POA: Insufficient documentation

## 2011-08-16 DIAGNOSIS — G40909 Epilepsy, unspecified, not intractable, without status epilepticus: Secondary | ICD-10-CM | POA: Insufficient documentation

## 2011-08-16 DIAGNOSIS — E785 Hyperlipidemia, unspecified: Secondary | ICD-10-CM | POA: Insufficient documentation

## 2011-08-16 DIAGNOSIS — I2510 Atherosclerotic heart disease of native coronary artery without angina pectoris: Secondary | ICD-10-CM | POA: Insufficient documentation

## 2011-08-16 DIAGNOSIS — R079 Chest pain, unspecified: Principal | ICD-10-CM | POA: Insufficient documentation

## 2011-08-16 DIAGNOSIS — K219 Gastro-esophageal reflux disease without esophagitis: Secondary | ICD-10-CM

## 2011-08-16 DIAGNOSIS — R110 Nausea: Secondary | ICD-10-CM | POA: Insufficient documentation

## 2011-08-16 DIAGNOSIS — E039 Hypothyroidism, unspecified: Secondary | ICD-10-CM | POA: Insufficient documentation

## 2011-08-16 LAB — URINALYSIS, ROUTINE W REFLEX MICROSCOPIC
Leukocytes, UA: NEGATIVE
Nitrite: NEGATIVE
Protein, ur: NEGATIVE mg/dL
Urobilinogen, UA: 0.2 mg/dL (ref 0.0–1.0)

## 2011-08-16 LAB — BASIC METABOLIC PANEL
Chloride: 102 mEq/L (ref 96–112)
GFR calc Af Amer: >90 mL/min (ref >90)
GFR calc non Af Amer: >90 mL/min (ref >90)
Glucose, Bld: 112 mg/dL — ABNORMAL HIGH (ref 70–99)
Potassium: 4.1 mEq/L (ref 3.5–5.1)
Sodium: 137 mEq/L (ref 135–145)

## 2011-08-16 LAB — HEPATIC FUNCTION PANEL
AST: 10 U/L (ref 0–37)
Bilirubin, Direct: <0.1 mg/dL (ref 0.0–0.3)
Total Protein: 7 g/dL (ref 6.0–8.3)

## 2011-08-16 LAB — CARDIAC PANEL(CRET KIN+CKTOT+MB+TROPI)
CK, MB: 1.9 ng/mL (ref 0.3–4.0)
CK, MB: 1.7 ng/mL (ref 0.3–4.0)
Troponin I: <0.3 ng/mL (ref <0.30)

## 2011-08-16 LAB — CBC
Hemoglobin: 12.2 g/dL (ref 12.0–15.0)
MCH: 29.1 pg (ref 26.0–34.0)
RBC: 4.19 MIL/uL (ref 3.87–5.11)

## 2011-08-16 LAB — PRO B NATRIURETIC PEPTIDE: Pro B Natriuretic peptide (BNP): 42.7 pg/mL (ref 0–125)

## 2011-08-16 LAB — LIPASE, BLOOD: Lipase: 21 U/L (ref 11–59)

## 2011-08-16 MED ORDER — NITROGLYCERIN IN D5W 200-5 MCG/ML-% IV SOLN
3.0000 ug/min | INTRAVENOUS | Status: DC
  Administered 2011-08-16: 5 ug/min via INTRAVENOUS
  Filled 2011-08-16: qty 250

## 2011-08-16 MED ORDER — AZELASTINE HCL 0.1 % NA SOLN
1.0000 | NASAL | Status: DC | PRN
  Filled 2011-08-16: qty 30

## 2011-08-16 MED ORDER — PRASUGREL HCL 10 MG PO TABS
60.0000 mg | ORAL_TABLET | Freq: Once | ORAL | Status: AC
  Administered 2011-08-16: 60 mg via ORAL
  Filled 2011-08-16: qty 6

## 2011-08-16 MED ORDER — ALBUTEROL SULFATE (5 MG/ML) 0.5% IN NEBU: 2.5000 mg | INHALATION_SOLUTION | RESPIRATORY_TRACT | Status: DC | PRN

## 2011-08-16 MED ORDER — LEVOTHYROXINE SODIUM 50 MCG PO TABS
50.0000 ug | ORAL_TABLET | Freq: Every day | ORAL | Status: DC
  Administered 2011-08-16 – 2011-08-17 (×2): 50 ug via ORAL
  Filled 2011-08-16 (×2): qty 1

## 2011-08-16 MED ORDER — ETHOSUXIMIDE 250 MG PO CAPS: 1500.0000 mg | ORAL_CAPSULE | Freq: Every day | ORAL | Status: DC

## 2011-08-16 MED ORDER — ALBUTEROL SULFATE (5 MG/ML) 0.5% IN NEBU
2.5000 mg | INHALATION_SOLUTION | RESPIRATORY_TRACT | Status: DC
  Administered 2011-08-16: 2.5 mg via RESPIRATORY_TRACT
  Filled 2011-08-16: qty 0.5

## 2011-08-16 MED ORDER — DIPHENHYDRAMINE HCL 25 MG PO CAPS: 25.0000 mg | ORAL_CAPSULE | Freq: Every evening | ORAL | Status: DC | PRN

## 2011-08-16 MED ORDER — FLUTICASONE PROPIONATE (INHAL) 50 MCG/BLIST IN AEPB: 1.0000 | INHALATION_SPRAY | RESPIRATORY_TRACT | Status: DC | PRN

## 2011-08-16 MED ORDER — ACETAMINOPHEN 500 MG PO TABS: 500.0000 mg | ORAL_TABLET | Freq: Four times a day (QID) | ORAL | Status: DC | PRN

## 2011-08-16 MED ORDER — HEPARIN BOLUS VIA INFUSION
4000.0000 [IU] | Freq: Once | INTRAVENOUS | Status: AC
  Administered 2011-08-16: 4000 [IU] via INTRAVENOUS
  Filled 2011-08-16: qty 4000

## 2011-08-16 MED ORDER — OMEGA-3-ACID ETHYL ESTERS 1 G PO CAPS
1.0000 g | ORAL_CAPSULE | ORAL | Status: DC
  Administered 2011-08-16: 1 g via ORAL
  Filled 2011-08-16: qty 1

## 2011-08-16 MED ORDER — ETHOSUXIMIDE 250 MG PO CAPS
1500.0000 mg | ORAL_CAPSULE | Freq: Every day | ORAL | Status: DC
  Filled 2011-08-16: qty 6

## 2011-08-16 MED ORDER — PRASUGREL HCL 10 MG PO TABS
10.0000 mg | ORAL_TABLET | Freq: Every day | ORAL | Status: DC
  Administered 2011-08-17: 10 mg via ORAL
  Filled 2011-08-16: qty 1

## 2011-08-16 MED ORDER — NITROGLYCERIN 0.4 MG SL SUBL
0.4000 mg | SUBLINGUAL_TABLET | SUBLINGUAL | Status: DC | PRN
  Administered 2011-08-16 (×2): 0.4 mg via SUBLINGUAL
  Filled 2011-08-16: qty 25

## 2011-08-16 MED ORDER — EZETIMIBE 10 MG PO TABS
10.0000 mg | ORAL_TABLET | Freq: Every day | ORAL | Status: DC
  Administered 2011-08-16 – 2011-08-17 (×2): 10 mg via ORAL
  Filled 2011-08-16 (×2): qty 1

## 2011-08-16 MED ORDER — HEPARIN SOD (PORCINE) IN D5W 100 UNIT/ML IV SOLN
1400.0000 [IU]/h | INTRAVENOUS | Status: DC
  Administered 2011-08-16: 950 [IU]/h via INTRAVENOUS
  Administered 2011-08-16: 1200 [IU]/h via INTRAVENOUS
  Administered 2011-08-17 (×2): 1400 [IU]/h via INTRAVENOUS
  Filled 2011-08-16 (×5): qty 250

## 2011-08-16 MED ORDER — CARBAMAZEPINE ER 100 MG PO TB12
500.0000 mg | ORAL_TABLET | Freq: Two times a day (BID) | ORAL | Status: DC
  Administered 2011-08-16 – 2011-08-17 (×3): 500 mg via ORAL
  Filled 2011-08-16 (×5): qty 1

## 2011-08-16 MED ORDER — ASPIRIN 81 MG PO CHEW
324.0000 mg | CHEWABLE_TABLET | Freq: Once | ORAL | Status: AC
  Administered 2011-08-16: 324 mg via ORAL
  Filled 2011-08-16: qty 4

## 2011-08-16 MED ORDER — HEPARIN SOD (PORCINE) IN D5W 100 UNIT/ML IV SOLN: 12.0000 [IU]/kg/h | INTRAVENOUS | Status: DC

## 2011-08-16 MED ORDER — OMEGA-3 FATTY ACIDS 1000 MG PO CAPS
1.0000 g | ORAL_CAPSULE | ORAL | Status: DC
  Filled 2011-08-16: qty 1

## 2011-08-16 MED ORDER — ETHOSUXIMIDE 250 MG PO CAPS
500.0000 mg | ORAL_CAPSULE | Freq: Two times a day (BID) | ORAL | Status: DC
  Administered 2011-08-16 – 2011-08-17 (×3): 500 mg via ORAL

## 2011-08-16 MED ORDER — LORATADINE 10 MG PO TABS
10.0000 mg | ORAL_TABLET | Freq: Every day | ORAL | Status: DC
  Administered 2011-08-16 – 2011-08-17 (×2): 10 mg via ORAL
  Filled 2011-08-16 (×2): qty 1

## 2011-08-16 MED ORDER — ETHOSUXIMIDE 250 MG PO CAPS: 250.0000 mg | ORAL_CAPSULE | Freq: Every day | ORAL | Status: DC

## 2011-08-16 MED ORDER — NAPROXEN 250 MG PO TABS
250.0000 mg | ORAL_TABLET | Freq: Two times a day (BID) | ORAL | Status: DC
  Filled 2011-08-16 (×4): qty 1

## 2011-08-16 MED ORDER — PANTOPRAZOLE SODIUM 40 MG PO TBEC
40.0000 mg | DELAYED_RELEASE_TABLET | Freq: Every day | ORAL | Status: DC
  Administered 2011-08-16 – 2011-08-17 (×2): 40 mg via ORAL
  Filled 2011-08-16 (×2): qty 1

## 2011-08-16 NOTE — Progress Notes (Signed)
ANTICOAGULATION CONSULT NOTE -  Follow-Up Consult   Pharmacy Consult for Heparin  Indication: chest pain/ACS   Allergies   Allergen  Reactions   .  Aspirin  Other (See Comments)     GI upset   .  Erythromycin      Gi upset   .  Imdur (Isosorbide Mononitrate)      Pt does not recall reaction   .  Lisinopril      cough   .  Niacin And Related      Pt does not recall reactions   .  Penicillins      Gi upset   .  Statins      Muscle pain   .  Sulfa Drugs Cross Reactors  Swelling   .  Tetracyclines & Related     Patient Measurements:  Height: 5\' 7"  (170.2 cm)  Weight: 192 lb 3.9 oz (87.2 kg)  IBW/kg (Calculated) : 61.6  Heparin Dosing Weight: 80.1kg   Vital Signs:  Temp: 97.6 F (36.4 C) (02/13 1021)  Temp src: Oral (02/13 1021)  BP: 130/65 mmHg (02/13 1200)  Pulse Rate: 53 (02/13 1200)   Labs:  Basename  08/16/11 0755   HGB  12.2   HCT  35.7*   PLT  333   APTT  --   LABPROT  --   INR  --   HEPARINUNFRC  -- 0.14  CREATININE  0.60   CKTOTAL  33   CKMB  1.9   TROPONINI  <0.30    Estimated Creatinine Clearance: 82.6 ml/min (by C-G formula based on Cr of 0.6).   Medical History:  Past Medical History   Diagnosis  Date   .  Coronary artery disease      Has had remote NSTEMI with PCI to the RCA; S/P CABG in 2007; LAST CATH WITH MODERATE DISEASE IN A VERY SMALL DIAGONAL BRANCH; SHE IS MANAGED MEDICALLY   .  Dyslipidemia    .  Seizure disorder    .  GERD (gastroesophageal reflux disease)    .  History of fibromyalgia    .  Chest pain    .  Monoclonal gammopathy of unknown significance    .  Hypothyroid    .  Aspirin intolerance    .  Statin intolerance     Medications:  .  albuterol  2.5 mg  Nebulization  Q4H   .  aspirin  324 mg  Oral  Once   .  carbamazepine  500 mg  Oral  BID   .  ethosuximide  250 mg  Oral  Daily   .  ethosuximide  500 mg  Oral  BID   .  ezetimibe  10 mg  Oral  Daily   .  fish oil-omega-3 fatty acids  1 g  Oral  QODAY   .   levothyroxine  50 mcg  Oral  Daily   .  loratadine  10 mg  Oral  Daily   .  naproxen  250 mg  Oral  BID WC   .  pantoprazole  40 mg  Oral  Q1200   .  prasugrel  60 mg  Oral  Once    Followed by   .  prasugrel  10 mg  Oral  Daily   .  DISCONTD: ethosuximide  1,500 mg  Oral  Daily   .  DISCONTD: ethosuximide  1,500 mg  Oral  Daily    Assessment:  50 yoF with  known CAD s/p PCI, CABG 2007 admitted with recurrent chest pain.  CE negative, normal EKG, plan for nuclear stress test 02/14.  Was instructed to start prasugrel as outpatient, but never started.  Intolerant to ASA and plavix.  Started heparin for ACS.    Initial heparin level subtherapeutic.  No bleeding noted.  Goal of Therapy:  Heparin level 0.3-0.7 units/ml   Plan:  1. Increase heparin infusion to 1200 units/hr (=54mL/hr)  3. Recheck 6 hour heparin level with morning labs 4. Follow-up daily CBC and heparin levels.  Junita Push, PharmD, BCPS  08/16/2011 9:22 PM

## 2011-08-16 NOTE — Progress Notes (Signed)
MEDICATION RELATED NOTE   Pharmacy Re:  Prasugrel and Naproxen   Drug-Drug Moderate NSAIDs / Antiplatelet Agents Use of NSAIDs with Antiplatelet Agents may increase the risk of bleeding.      prasugrel (EFFIENT) tablet 10 mg      naproxen sodium (ANAPROX) tablet 220 mg       Consider:  Holding NSAID's or at minimum make PRN while receiving Prasugrel.  Nadara Mustard, PharmD., MS Clinical Pharmacist Pager:   [phone]  08/16/2011,12:40 PM

## 2011-08-16 NOTE — ED Notes (Signed)
Pt c/o epigastric pain shoots upward into chest x 2 days intermittent and pain began in left upper arm this morning. Pt reports pain 1/10 and sts she took 1 NTG sl 30 mins prior to arrival. Pt has h/o MI with double bypass. Pt drove self to ED.

## 2011-08-16 NOTE — H&P (Signed)
CARDIOLOGY ADMISSION NOTE  Patient ID: Rachel Clark MRN: [id number] DOB/AGE: [birth year] 61 y.o.  Admit date: 08/16/2011 Primary Physician    Primary Cardiologist:  Dr. Alysiah Suppa [place] Chief Complaint    Chest pain  HPI: Rachel Clark is a 64 W with PMH of HLP, fibromyalgia, 61 W GERD, seizure disorder, hypothyroidism, MGUS, CAD s/p PCI to RCA and CABG in 2007 with last cath in 10/2008 which showed obstructive native vessel disease in LAD, RCA, and first diagonal, patent vein graft to distal RCA and LIMA to LAD, EF 65% [facility] to [facility] for [facility] evaluation.  She states that she has been having chest pain intermittently in the past few days, with sharp, burning sensation, pressure, dull pain.  Pain is 4/10 at its worst and 1/10 at present, locating at epigastric to midchest.  She has radiation to left jaw with numbness and tingling as well as her left shoulder. She denies any diaphoresis. Nitroglycerin helps alleviate her pain but no exacerbation factor.  She also endorses some abdominal pain with nausea in the past few days so she attributes her chest pain from GERD.   At med center, her CE x 1 was neg and EKG was normal and was given 1 nitro which relieved her pain.  During transfer, she developed chest pain associated with Nausea and SOB and jaw radiation and was given 2 nitroglycerin tablets which resolved her pain.  On arrival, patient still has recurrent intermittent chest pain lasting from few seconds to minutes. Of note, patient is not on ASA or Plavix as antiplatelet therapy due to GI intolerance.  She was instructed by Dr. [place] to start Effient; however, she has not started yet.  Past Medical History  Diagnosis Date  . Coronary artery disease     Has had remote NSTEMI with PCI to the RCA; S/P CABG in 2007; LAST CATH WITH MODERATE DISEASE IN A VERY SMALL DIAGONAL BRANCH; SHE IS MANAGED MEDICALLY  . Dyslipidemia   . Seizure disorder     . GERD (gastroesophageal reflux disease)   . History of fibromyalgia   . Chest pain   . Monoclonal gammopathy of unknown significance   . Hypothyroid   . Aspirin intolerance   . Statin intolerance     Past Surgical History  Procedure Date  . Cardiac catheterization 05/20/2009    obstructive native vessel disease in LAD, RCA, and first diagonal, patent vein graft to distal RCA and LIMA to LAD,normal. ef 65%  . Coronary artery bypass graft     SAPHENOUS VEIN GRAFT TO THE DISTAL RIGHT CORONARY IN AN LIMA GRAFT TO THE LAD. 2007  . Partial hysterectomy   . Posterior laminectomy / decompression cervical spine     Allergies  Allergen Reactions  . Aspirin Other (See Comments)    GI upset  . Erythromycin     Gi upset  . Imdur (Isosorbide Mononitrate)     Pt does not recall reaction  . Lisinopril     cough  . Niacin And Related     Pt does not recall reactions   . Penicillins     Gi upset  . Statins     Muscle pain  . Sulfa Drugs Cross Reactors Swelling  . Tetracyclines & Related    No current facility-administered medications on file prior to encounter.   Current Outpatient Prescriptions on File Prior to Encounter  Medication Sig Dispense Refill  . albuterol (PROVENTIL) (2.5  MG/3ML) 0.083% nebulizer solution Take 2.5 mg by nebulization every 6 (six) hours as needed. For allergies      . carbamazepine (TEGRETOL XR) 100 MG 12 hr tablet Take 500 mg by mouth 2 (two) times daily. Takes 5 tabletsDAILY      . ethosuximide (ZARONTIN) 250 MG capsule Take 1,500 mg by mouth daily.       . fish oil-omega-3 fatty acids 1000 MG capsule Take 1 g by mouth every other day.       . levothyroxine (SYNTHROID, LEVOTHROID) 50 MCG tablet Take 50 mcg by mouth daily.       Marland Kitchen VIVELLE-DOT 0.075 MG/24HR Place 1 patch onto the skin 2 (two) times a week. Due for change on 08-17-11      . DISCONTD: ezetimibe (ZETIA) 10 MG tablet Take 1 tablet (10 mg total) by mouth daily.  30 tablet  4  . Azelastine HCl  (ASTEPRO) 0.15 % SOLN Place 1 spray into the nose as needed. For allergies      . cetirizine (ZYRTEC) 10 MG tablet Take 10 mg by mouth as needed. For allergies      . fluticasone (FLOVENT DISKUS) 50 MCG/BLIST diskus inhaler Inhale 1 puff into the lungs as needed. For allergies       History   Social History  . Marital Status: Married    Spouse Name: N/A    Number of Children: 2  . Years of Education: N/A   Occupational History  . office work     unemployed   Social History Main Topics  . Smoking status: Former Smoker -- 0.3 packs/day for 20 years    Types: Cigarettes    Quit date: 12/23/1990  . Smokeless tobacco: Never Used  . Alcohol Use: No  . Drug Use: No  . Sexually Active: Not on file   Other Topics Concern  . Not on file   Social History Narrative  . No narrative on file    Family History  Problem Relation Age of Onset  . Heart attack Father   . Heart attack Brother 40      Physical Exam: Blood pressure 130/65, pulse 53, temperature 97.6 F (36.4 C), temperature source Oral, resp. rate 16, height 5\' 7"  (1.702 m), weight 192 lb 3.9 oz (87.2 kg), SpO2 99.00%.  General: alert, well-developed, and cooperative to examination.  Head: normocephalic and atraumatic.  Eyes: vision grossly intact, pupils equal, pupils round, pupils reactive to light, no injection and anicteric.  Mouth: pharynx pink and moist, no erythema, and no exudates.  Neck: supple, full ROM, no thyromegaly, no JVD, and no carotid bruits.  Lungs: normal respiratory effort, no accessory muscle use, normal breath sounds, no crackles, and no wheezes. Heart: normal rate, regular rhythm, no murmur, no gallop, and no rub.  Abdomen: soft, non-tender, normal bowel sounds, no distention, no guarding, no rebound tenderness, no hepatomegaly, and no splenomegaly.  Msk: no joint swelling, no joint warmth, and no redness over joints.  Pulses: 2+ DP/PT pulses bilaterally Extremities: No cyanosis, clubbing,  edema Neurologic: alert & oriented X3, cranial nerves II-XII intact, strength normal in all extremities, sensation intact to light touch, and gait normal. No facial droop noted. Skin: turgor normal and no rashes.  Psych: Oriented X3, memory intact for recent and remote, normally interactive, good eye contact, not anxious appearing, and not depressed appearing.  Labs: Lab Results  Component Value Date   BUN 13 08/16/2011   Lab Results  Component Value Date  CREATININE 0.60 08/16/2011   Lab Results  Component Value Date   NA 137 08/16/2011   K 4.1 08/16/2011   CL 102 08/16/2011   CO2 26 08/16/2011   Lab Results  Component Value Date   CKTOTAL 33 08/16/2011   CKMB 1.9 08/16/2011   TROPONINI <0.30 08/16/2011   Lab Results  Component Value Date   WBC 7.9 08/16/2011   HGB 12.2 08/16/2011   HCT 35.7* 08/16/2011   MCV 85.2 08/16/2011   PLT 333 08/16/2011   Lab Results  Component Value Date   CHOL  Value: 190        ATP III CLASSIFICATION:  <200     mg/dL   Desirable  161-096  mg/dL   Borderline High  >=045    mg/dL   High        40/98/1191   HDL 57 05/19/2009   LDLCALC  Value: 100        Total Cholesterol/HDL:CHD Risk Coronary Heart Disease Risk Table                     Men   Women  1/2 Average Risk   3.4   3.3  Average Risk       5.0   4.4  2 X Average Risk   9.6   7.1  3 X Average Risk  23.4   11.0        Use the calculated Patient Ratio above and the CHD Risk Table to determine the patient's CHD Risk.        ATP III CLASSIFICATION (LDL):  <100     mg/dL   Optimal  478-295  mg/dL   Near or Above                    Optimal  130-159  mg/dL   Borderline  621-308  mg/dL   High  >657     mg/dL   Very High* 84/69/6295   TRIG 164* 05/19/2009   CHOLHDL 3.3 05/19/2009   Lab Results  Component Value Date   ALT 12 03/28/2011   ALT 12 03/28/2011   ALT 12 03/28/2011   ALT 12 03/28/2011   AST 14 03/28/2011   AST 14 03/28/2011   AST 14 03/28/2011   AST 14 03/28/2011   ALKPHOS 109 03/28/2011   ALKPHOS 109  03/28/2011   ALKPHOS 109 03/28/2011   ALKPHOS 109 03/28/2011   BILITOT 0.2* 03/28/2011   BILITOT 0.2* 03/28/2011   BILITOT 0.2* 03/28/2011   BILITOT 0.2* 03/28/2011      Radiology:  CXR 08/16/11 Findings: Trachea is midline. Heart size normal. Sternotomy wires  are stable in position. Mild scarring at the left lung base. Lungs  are otherwise clear. No pleural fluid.  IMPRESSION:  No acute findings.   EKG:  No ST or T wave changes compare to previous EKG on 06/2011.  ASSESSMENT AND PLAN:   1. Chest pain- unclear etiology given her symptoms are not typical of ACS, but given her past history of ACS s/p stent and CABG, will need to admit to rule out ACS. Although, first set of cardiac enzyme is negative and EKG did not show any acute ST or T wave changes.  Other differential dx include GERD and or fibromyalgia/pancreatitis. Not on ACEi due to cough. -Admit to SDU -Cycle CEs x3 -Check lipase -Repeat EKG now - which shows T wave inversion in V1-2 -Repeat EKG in AM -Plan for nuclear stress in  AM unless her enzymes or EKG becomes positive, then may need urgent cardiac cath. -Start heparin & nitroglycerin gtts -Start Effient loading 60mg  and then 10mg  daily thereafter. Patient was instructed by Dr. [place] to start Effient as outpatient for antiplatelet; however, she has not started yet.  She has intolerant to ASA and Plavix. -Check TSH -NPO after midnight for nuclear stress test in AM  2. Hypothyroidism: will continue Synthroid 50 mcg and check TSH  3. GERD: Will start protonix 40mg  po bid  4. HLP: Last lipid panel in 2010 showed LDL of 100, trig 164, total chol 190, HDL 57.   She has intolerance to statin which cause muscle cramps; therefore, she takes Zetia instead. -Will continue Zetia -Will check FLP  5. Seizure disorder: stable, will continue home meds  6. Fibromyalgia: stable, continue home meds  VTE ppx: on full dose heparin   Signed: HO,MICHELE 08/16/2011, 12:53 PM  Patient  seen and examined and history reviewed. Agree with above findings and plan. Patient has significant cardiac history as noted. Exam today is benign. She initially has relief of chest pain with Ntg but pain recurred on transport to [facility]. Pain described as more of acid reflux symptom. She had extensive evaluation in 2010 for similar sx with chest, abd, and pelvic CT. Cardiac cath showed patent grafts. Will cycle cardiac enzymes and Ecg. Start PPI. Start Effient since she is intolerant of ASA and Plavix. If evaluation negative will proceed with nuclear stress test in am.  Thedora Hinders 08/16/2011 3:05 PM

## 2011-08-16 NOTE — Progress Notes (Signed)
ANTICOAGULATION CONSULT NOTE - Initial Consult   Pharmacy Consult for Heparin  Indication: chest pain/ACS   Allergies   Allergen  Reactions   .  Aspirin  Other (See Comments)     GI upset   .  Erythromycin      Gi upset   .  Imdur (Isosorbide Mononitrate)      Pt does not recall reaction   .  Lisinopril      cough   .  Niacin And Related      Pt does not recall reactions   .  Penicillins      Gi upset   .  Statins      Muscle pain   .  Sulfa Drugs Cross Reactors  Swelling   .  Tetracyclines & Related     Patient Measurements:  Height: 5\' 7"  (170.2 cm)  Weight: 192 lb 3.9 oz (87.2 kg)  IBW/kg (Calculated) : 61.6  Heparin Dosing Weight: 80.1kg   Vital Signs:  Temp: 97.6 F (36.4 C) (02/13 1021)  Temp src: Oral (02/13 1021)  BP: 130/65 mmHg (02/13 1200)  Pulse Rate: 53 (02/13 1200)   Labs:  Basename  08/16/11 0755   HGB  12.2   HCT  35.7*   PLT  333   APTT  --   LABPROT  --   INR  --   HEPARINUNFRC  --   CREATININE  0.60   CKTOTAL  33   CKMB  1.9   TROPONINI  <0.30    Estimated Creatinine Clearance: 82.6 ml/min (by C-G formula based on Cr of 0.6).   Medical History:  Past Medical History   Diagnosis  Date   .  Coronary artery disease      Has had remote NSTEMI with PCI to the RCA; S/P CABG in 2007; LAST CATH WITH MODERATE DISEASE IN A VERY SMALL DIAGONAL BRANCH; SHE IS MANAGED MEDICALLY   .  Dyslipidemia    .  Seizure disorder    .  GERD (gastroesophageal reflux disease)    .  History of fibromyalgia    .  Chest pain    .  Monoclonal gammopathy of unknown significance    .  Hypothyroid    .  Aspirin intolerance    .  Statin intolerance     Medications:  .  albuterol  2.5 mg  Nebulization  Q4H   .  aspirin  324 mg  Oral  Once   .  carbamazepine  500 mg  Oral  BID   .  ethosuximide  250 mg  Oral  Daily   .  ethosuximide  500 mg  Oral  BID   .  ezetimibe  10 mg  Oral  Daily   .  fish oil-omega-3 fatty acids  1 g  Oral  QODAY   .  levothyroxine  50  mcg  Oral  Daily   .  loratadine  10 mg  Oral  Daily   .  naproxen  250 mg  Oral  BID WC   .  pantoprazole  40 mg  Oral  Q1200   .  prasugrel  60 mg  Oral  Once    Followed by   .  prasugrel  10 mg  Oral  Daily   .  DISCONTD: ethosuximide  1,500 mg  Oral  Daily   .  DISCONTD: ethosuximide  1,500 mg  Oral  Daily    Assessment:  32 yoF with known  CAD s/p PCI, CABG 2007 admitted with recurrent chest pain.  CE x 1 neg, normal EKG, plan for nuclear stress test 02/14.  Was instructed to start prasugrel as outpatient, but never started.  Intolerant to ASA and plavix.    Pharmacy asked to manage heparin for ACS.  H/H 12.2/35.7, plts OK.  No bleeding noted.  Heparin dosing weight: 80.1kg.    Goal of Therapy:  Heparin level 0.3-0.7 units/ml   Plan:  1. Heparin 4000 units IV bolus x 1  2. Heparin infusion 950 units/hr (=9.82mL/hr)  3. Check 6 hour heparin level  4. Follow-up daily CBC and heparin levels.   [name] Parody, PharmD Candidate  08/16/2011,1:34 PM  Haynes Hoehn, PharmD 08/16/2011 2:07 PM  Pager: [phone]

## 2011-08-16 NOTE — ED Notes (Signed)
Attempted to call report to receiving unit, nurse unavailable.

## 2011-08-16 NOTE — ED Provider Notes (Addendum)
History     CSN: [id number]  Arrival date & time 08/16/11  7829   First MD Initiated Contact with Patient 08/16/11 [phone]      Chief Complaint  Patient presents with  . Chest Pain   Patient began having chest pain, over the past 2 days. That was intermittent. This morning. The chest pain was worse beginning in the epigastrium and radiating up into the left upper chest. Patient also felt numbness and tingling in the left side of her face and began to feel dizzy. She was actually driving her son to the airport, which she states the pain became worse and was radiating into her left arm. She did take a sublingual nitroglycerin while she was driving 30 minutes prior to arrival. The pain was approximately 10 and initially came down to 1/10, but is now 0/10. Patient has a previous history of MI with cardiac stents and double bypass. Patient did drive herself to the ED. She noted that she felt quite dizzy and nauseous and slightly short of breath. When the pain became worse this morning. She is on effient.   time. She appears in no acute distress at this time (Consider location/radiation/quality/duration/timing/severity/associated sxs/prior treatment) HPI  Past Medical History  Diagnosis Date  . Coronary artery disease     Has had remote NSTEMI with PCI to the RCA; S/P CABG in 2007; LAST CATH WITH MODERATE DISEASE IN A VERY SMALL DIAGONAL BRANCH; SHE IS MANAGED MEDICALLY  . Dyslipidemia   . Seizure disorder   . GERD (gastroesophageal reflux disease)   . History of fibromyalgia   . Chest pain   . Monoclonal gammopathy of unknown significance   . Hypothyroid   . Aspirin intolerance   . Statin intolerance     Past Surgical History  Procedure Date  . Cardiac catheterization 05/20/2009    normal. ef 60%  . Coronary artery bypass graft     SAPHENOUS VEIN GRAFT TO THE DISTAL RIGHT CORONARY IN AN LIMA GRAFT TO THE LAD.  Marland Kitchen Partial hysterectomy   . Posterior laminectomy / decompression cervical  spine     Family History  Problem Relation Age of Onset  . Heart attack Father   . Heart attack Brother 40    History  Substance Use Topics  . Smoking status: Former Smoker -- 0.3 packs/day for 20 years    Types: Cigarettes    Quit date: 12/23/1990  . Smokeless tobacco: Never Used  . Alcohol Use: No    OB History    Grav Para Term Preterm Abortions TAB SAB Ect Mult Living                  Review of Systems  All other systems reviewed and are negative.    Allergies  Aspirin; Erythromycin; Imdur; Lisinopril; Niacin and related; Penicillins; Statins; Sulfa drugs cross reactors; and Tetracyclines & related  Home Medications   Current Outpatient Rx  Name Route Sig Dispense Refill  . ALBUTEROL SULFATE (2.5 MG/3ML) 0.083% IN NEBU Nebulization Take 2.5 mg by nebulization every 6 (six) hours as needed.      . AZELASTINE HCL 0.15 % NA SOLN Nasal Place into the nose as needed.     . CARBAMAZEPINE ER 100 MG PO TB12 Oral Take 500 mg by mouth 2 (two) times daily.      Marland Kitchen CETIRIZINE HCL 10 MG PO TABS Oral Take 10 mg by mouth as needed.      Marland Kitchen ETHOSUXIMIDE 250 MG PO CAPS Oral  Take 1,500 mg by mouth daily.      Marland Kitchen EZETIMIBE 10 MG PO TABS Oral Take 1 tablet (10 mg total) by mouth daily. 30 tablet 4  . OMEGA-3 FATTY ACIDS 1000 MG PO CAPS Oral Take 1 g by mouth every other day.      Marland Kitchen FLUTICASONE PROPIONATE (INHAL) 50 MCG/BLIST IN AEPB Inhalation Inhale 1 puff into the lungs as needed.     Marland Kitchen LEVOTHYROXINE SODIUM 50 MCG PO TABS Oral Take 50 mcg by mouth daily.      Marland Kitchen PRASUGREL HCL 10 MG PO TABS Oral Take 1 tablet (10 mg total) by mouth daily. 30 tablet 11  . VITAMIN D (ERGOCALCIFEROL) 50000 UNITS PO CAPS      . VIVELLE-DOT 0.075 MG/24HR TD PTTW        BP 164/76  Pulse 76  Temp(Src) 97.9 F (36.6 C) (Oral)  Resp 16  SpO2 99%  Physical Exam  Nursing note and vitals reviewed. Constitutional: She is oriented to person, place, and time. She appears well-developed and well-nourished.    HENT:  Head: Normocephalic and atraumatic.  Eyes: Conjunctivae and EOM are normal. Pupils are equal, round, and reactive to light.  Neck: Neck supple.  Cardiovascular: Normal rate and regular rhythm.  Exam reveals no gallop and no friction rub.   No murmur heard. Pulmonary/Chest: Breath sounds normal. She has no wheezes. She has no rales. She exhibits no tenderness.  Abdominal: Soft. Bowel sounds are normal. She exhibits no distension. There is no tenderness. There is no rebound and no guarding.  Musculoskeletal: Normal range of motion.  Neurological: She is alert and oriented to person, place, and time. No cranial nerve deficit. Coordination normal.  Skin: Skin is warm and dry. No rash noted.  Psychiatric: She has a normal mood and affect.    ED Course  Procedures (including critical care time)  Labs Reviewed  BASIC METABOLIC PANEL - Abnormal; Notable for the following:    Glucose, Bld 112 (*)    All other components within normal limits  CBC - Abnormal; Notable for the following:    HCT 35.7 (*)    All other components within normal limits  CARDIAC PANEL(CRET KIN+CKTOT+MB+TROPI)   Dg Chest 2 View  08/16/2011  *RADIOLOGY REPORT*  Clinical Data: Chest pain.  CHEST - 2 VIEW  Comparison: 06/14/2011.  Findings: Trachea is midline.  Heart size normal.  Sternotomy wires are stable in position. Mild scarring at the left lung base.  Lungs are otherwise clear.  No pleural fluid.  IMPRESSION: No acute findings.  Original Report Authenticated By: Reyes Ivan, M.D.     1. Chest pain       MDM  Pt is seen and examined;  Initial history and physical completed.  Will follow.    Concerning story for possible unstable angina in a patient with a significant past medical history. EKG documented below. Will obtain stat labs, IV, O2 monitor, and speak with the [facility], cardiology. Will give patient one aspirin. She is currently chest pain-free   Date: 08/16/2011  Rate: 70  Rhythm:  normal sinus rhythm  QRS Axis: normal  Intervals: normal  ST/T Wave abnormalities: nonspecific ST/T changes  Conduction Disutrbances:none  Narrative Interpretation:   Old EKG Reviewed: no sig. changes    =============================================  Progress Notes     Ulis Kaps [place], MD  07/28/2011  3:42 PM  Signed     [name] Date of Birth: [birth year] Medical Record #[id number]   History  of Present Illness: Ms. Loge is seen today for a followup visit.  She has a known history of CAD with prior CABG x 2 in 2007. Last cath in 2010 and medical therapy was recommended. She does have known moderate disease in a very small diagonal branch.  Last stress test dates back to 2007. She is not able to take aspirin due to GI upset. No statin therapy on board due to intolerance. She was placed on Plavix previously but quit taking this do to some type of intolerance. She cannot really remember why. On followup today she is feeling very well. She has had no chest pain, shortness of breath, or palpitations. She is active. She states she always does best in the winter when the air is cold.          Past Medical History   Diagnosis  Date   .  Coronary artery disease         Has had remote NSTEMI with PCI to the RCA; S/P CABG in 2007; LAST CATH WITH MODERATE DISEASE IN A VERY SMALL DIAGONAL BRANCH; SHE IS MANAGED MEDICALLY   .  Dyslipidemia     .  Seizure disorder     .  GERD (gastroesophageal reflux disease)     .  History of fibromyalgia     .  Chest pain     .  Monoclonal gammopathy of unknown significance     .  Hypothyroid     .  Aspirin intolerance     .  Statin intolerance         Past Surgical History   Procedure  Date   .  Cardiac catheterization  05/20/2009       normal. ef 60%   .  Coronary artery bypass graft         SAPHENOUS VEIN GRAFT TO THE DISTAL RIGHT CORONARY IN AN LIMA GRAFT TO THE LAD.   Marland Kitchen  Partial hysterectomy     .  Posterior laminectomy /  decompression cervical spine         History   Smoking status   .  Former Smoker -- 0.3 packs/day for 20 years   .  Types:  Cigarettes   .  Quit date:  12/23/1990   Smokeless tobacco   .  Never Used       History   Alcohol Use  No       Family History   Problem  Relation  Age of Onset   .  Heart attack  Father     .  Heart attack  Brother  40            Coronary artery disease - Malaina Mortellaro [place], MD  07/28/2011  3:41 PM  Signed She is asymptomatic today. Her last ischemic evaluation was with cardiac catheterization in November of 2010. It is unclear why she quit taking Plavix. She is aspirin intolerant. I recommended starting her on Effient 10 mg daily. I will followup again in 6 months.  Dyslipidemia - Jahari Billy [place], MD  07/28/2011  3:41 PM  Signed She reports that her primary care checked her lipid levels. She is currently on Zetia and fish oil.     ====================================================================   Results for orders placed during the hospital encounter of 08/16/11  CARDIAC PANEL(CRET KIN+CKTOT+MB+TROPI)      Component Value Range   Total CK 33  7 - 177 (U/L)   CK, MB 1.9  0.3 - 4.0 (ng/mL)  Troponin I <0.30  <0.30 (ng/mL)   Relative Index RELATIVE INDEX IS INVALID  0.0 - 2.5   BASIC METABOLIC PANEL      Component Value Range   Sodium 137  135 - 145 (mEq/L)   Potassium 4.1  3.5 - 5.1 (mEq/L)   Chloride 102  96 - 112 (mEq/L)   CO2 26  19 - 32 (mEq/L)   Glucose, Bld 112 (*) 70 - 99 (mg/dL)   BUN 13  6 - 23 (mg/dL)   Creatinine, Ser 9.14  0.50 - 1.10 (mg/dL)   Calcium 8.8  8.4 - 78.2 (mg/dL)   GFR calc non Af Amer >90  >90 (mL/min)   GFR calc Af Amer >90  >90 (mL/min)  CBC      Component Value Range   WBC 7.9  4.0 - 10.5 (K/uL)   RBC 4.19  3.87 - 5.11 (MIL/uL)   Hemoglobin 12.2  12.0 - 15.0 (g/dL)   HCT 95.6 (*) 21.3 - 46.0 (%)   MCV 85.2  78.0 - 100.0 (fL)   MCH 29.1  26.0 - 34.0 (pg)   MCHC 34.2  30.0 - 36.0 (g/dL)   RDW 08.6  57.8 - 46.9  (%)   Platelets 333  150 - 400 (K/uL)   Dg Chest 2 View  08/16/2011  *RADIOLOGY REPORT*  Clinical Data: Chest pain.  CHEST - 2 VIEW  Comparison: 06/14/2011.  Findings: Trachea is midline.  Heart size normal.  Sternotomy wires are stable in position. Mild scarring at the left lung base.  Lungs are otherwise clear.  No pleural fluid.  IMPRESSION: No acute findings.  Original Report Authenticated By: Reyes Ivan, M.D.    9:14 AM  [facility] paged   Lorelle Gibbs. Patrica Duel, MD 08/16/11 6295   9:27 AM  Spoke with cardiology, Dr. [place]. He also agrees. The patient should be admitted  Gari Hartsell A. Patrica Duel, MD 08/16/11 [phone]

## 2011-08-17 ENCOUNTER — Inpatient Hospital Stay (HOSPITAL_COMMUNITY): Payer: BC Managed Care – PPO

## 2011-08-17 DIAGNOSIS — K219 Gastro-esophageal reflux disease without esophagitis: Secondary | ICD-10-CM

## 2011-08-17 DIAGNOSIS — R079 Chest pain, unspecified: Secondary | ICD-10-CM

## 2011-08-17 LAB — BASIC METABOLIC PANEL
BUN: 12 mg/dL (ref 6–23)
CO2: 26 mEq/L (ref 19–32)
Chloride: 104 mEq/L (ref 96–112)
GFR calc Af Amer: >90 mL/min (ref >90)
Glucose, Bld: 114 mg/dL — ABNORMAL HIGH (ref 70–99)
Potassium: 4.3 mEq/L (ref 3.5–5.1)

## 2011-08-17 LAB — PROTIME-INR: INR: 1.03 (ref 0.00–1.49)

## 2011-08-17 LAB — LIPID PANEL
Cholesterol: 268 mg/dL — ABNORMAL HIGH (ref 0–200)
Triglycerides: 107 mg/dL (ref <150)
VLDL: 21 mg/dL (ref 0–40)

## 2011-08-17 LAB — CBC
HCT: 36 % (ref 36.0–46.0)
Hemoglobin: 12 g/dL (ref 12.0–15.0)
MCH: 28.4 pg (ref 26.0–34.0)
MCHC: 33.3 g/dL (ref 30.0–36.0)
MCV: 85.3 fL (ref 78.0–100.0)
Platelets: 304 10*3/uL (ref 150–400)
RBC: 4.22 MIL/uL (ref 3.87–5.11)
RDW: 13.2 % (ref 11.5–15.5)
WBC: 9.1 10*3/uL (ref 4.0–10.5)

## 2011-08-17 LAB — HEMOGLOBIN A1C: Hgb A1c MFr Bld: 5.7 % — ABNORMAL HIGH (ref <5.7)

## 2011-08-17 LAB — HEPARIN LEVEL (UNFRACTIONATED): Heparin Unfractionated: 0.32 IU/mL (ref 0.30–0.70)

## 2011-08-17 MED ORDER — HEPARIN BOLUS VIA INFUSION
2000.0000 [IU] | Freq: Once | INTRAVENOUS | Status: AC
  Administered 2011-08-17: 2000 [IU] via INTRAVENOUS
  Filled 2011-08-17: qty 2000

## 2011-08-17 MED ORDER — REGADENOSON 0.4 MG/5ML IV SOLN
0.4000 mg | Freq: Once | INTRAVENOUS | Status: DC
  Filled 2011-08-17: qty 5

## 2011-08-17 MED ORDER — NITROGLYCERIN 0.4 MG SL SUBL: 0.4000 mg | SUBLINGUAL_TABLET | SUBLINGUAL | Status: DC | PRN

## 2011-08-17 MED ORDER — PRASUGREL HCL 10 MG PO TABS: 10.0000 mg | ORAL_TABLET | Freq: Every day | ORAL | Status: DC

## 2011-08-17 MED ORDER — PANTOPRAZOLE SODIUM 40 MG PO TBEC: 40.0000 mg | DELAYED_RELEASE_TABLET | Freq: Every day | ORAL | Status: DC

## 2011-08-17 NOTE — Progress Notes (Signed)
TELEMETRY: Reviewed telemetry pt in NSR, infrequent PVCs, couplets.: Filed Vitals:   08/17/11 0100 08/17/11 0300 08/17/11 0321 08/17/11 0500  BP: 139/65 119/67  130/63  Pulse: 74 76  60  Temp:   97.7 F (36.5 C)   TempSrc:   Oral   Resp:      Height:      Weight:    87.1 kg (192 lb 0.3 oz)  SpO2: 96% 96%  100%    Intake/Output Summary (Last 24 hours) at 08/17/11 0734 Last data filed at 08/17/11 1610  Gross per 24 hour  Intake    432 ml  Output    800 ml  Net   -368 ml    SUBJECTIVE No further chest pain. Feels well this am.  LABS: Basic Metabolic Panel:  Basename 08/17/11 0430 08/16/11 0755  NA 137 137  K 4.3 4.1  CL 104 102  CO2 26 26  GLUCOSE 114* 112*  BUN 12 13  CREATININE 0.61 0.60  CALCIUM 9.2 8.8  MG -- --  PHOS -- --   Liver Function Tests:  Basename 08/16/11 1247  AST 10  ALT 10  ALKPHOS 89  BILITOT 0.2*  PROT 7.0  ALBUMIN 3.6    Basename 08/16/11 1247  LIPASE 21  AMYLASE --   CBC:  Basename 08/17/11 0430 08/16/11 0755  WBC 9.1 7.9  NEUTROABS -- --  HGB 12.0 12.2  HCT 36.0 35.7*  MCV 85.3 85.2  PLT 304 333   Cardiac Enzymes:  Basename 08/17/11 0430 08/16/11 2012 08/16/11 1247  CKTOTAL 26 28 28   CKMB 1.7 1.7 1.8  CKMBINDEX -- -- --  TROPONINI <0.30 <0.30 <0.30   BNP: No components found with this basename: POCBNP:3 D-Dimer: No results found for this basename: DDIMER:2 in the last 72 hours Hemoglobin A1C:  Basename 08/16/11 1247  HGBA1C 5.7*   Fasting Lipid Panel:  Basename 08/17/11 0430  CHOL 268*  HDL 82  LDLCALC 165*  TRIG 107  CHOLHDL 3.3  LDLDIRECT --   Thyroid Function Tests:  Basename 08/16/11 1247  TSH 1.143  T4TOTAL --  T3FREE --  THYROIDAB --   Anemia Panel: No results found for this basename: VITAMINB12,FOLATE,FERRITIN,TIBC,IRON,RETICCTPCT in the last 72 hours  Radiology/Studies:  Dg Chest 2 View  08/16/2011  *RADIOLOGY REPORT*  Clinical Data: Chest pain.  CHEST - 2 VIEW  Comparison:  06/14/2011.  Findings: Trachea is midline.  Heart size normal.  Sternotomy wires are stable in position. Mild scarring at the left lung base.  Lungs are otherwise clear.  No pleural fluid.  IMPRESSION: No acute findings.  Original Report Authenticated By: Reyes Ivan, M.D.   ECG: NSR nonspecific ST abn.  PHYSICAL EXAM General: Well developed, well nourished, in no acute distress. Head: Normocephalic, atraumatic, sclera non-icteric, no xanthomas, nares are without discharge. Neck: Negative for carotid bruits. JVD not elevated. Lungs: Clear bilaterally to auscultation without wheezes, rales, or rhonchi. Breathing is unlabored. Heart: RRR S1 S2 without murmurs, rubs, or gallops.  Abdomen: Soft, non-tender, non-distended with normoactive bowel sounds. No hepatomegaly. No rebound/guarding. No obvious abdominal masses. Msk:  Strength and tone appears normal for age. Extremities: No clubbing, cyanosis or edema.  Distal pedal pulses are 2+ and equal bilaterally. Neuro: Alert and oriented X 3. Moves all extremities spontaneously. Psych:  Responds to questions appropriately with a normal affect.  ASSESSMENT AND PLAN: 1. Chest pain. Patient has ruled out for MI. Atypical features with chest pain. Will DC IV ntg. Proceed with  stress myoview today. If negative can discharge later today. Continue effient. Continue PPI.  2. Hypercholesterolemia. LDL 165. HDL 80, triglycerides nl. Intolerant to statins. On Zetia. Consider adding Welchol.  3. CAD s/p CABG  4.GERD on PPI  Principal Problem:  *Chest pain Active Problems:  GERD (gastroesophageal reflux disease)    Signed, Josetta Wigal [place] MD 08/17/2011 7:34 AM

## 2011-08-17 NOTE — Progress Notes (Signed)
BACK FROM NUCLEAR MED. BY WHEELCHAIR AWAKE AND ALERT. DENIED ANY DISCOMFORT.

## 2011-08-17 NOTE — Progress Notes (Signed)
UR Completed. Simmons, Sherlin Sonier F [phone]  

## 2011-08-17 NOTE — Progress Notes (Signed)
ANTICOAGULATION CONSULT NOTE - Follow Up Consult  Pharmacy Consult for Heparin Indication: chest pain/ACS  Allergies  Allergen Reactions  . Aspirin Other (See Comments)    GI upset  . Erythromycin     Gi upset  . Imdur (Isosorbide Mononitrate)     Pt does not recall reaction  . Lisinopril     cough  . Niacin And Related     Pt does not recall reactions   . Penicillins     Gi upset  . Statins     Muscle pain  . Sulfa Drugs Cross Reactors Swelling  . Tetracyclines & Related     Patient Measurements: Height: 5\' 7"  (170.2 cm) Weight: 192 lb 3.9 oz (87.2 kg) IBW/kg (Calculated) : 61.6    Vital Signs: Temp: 97.7 F (36.5 C) (02/14 0321) Temp src: Oral (02/14 0321) BP: 119/67 mmHg (02/14 0300) Pulse Rate: 76  (02/14 0300)  Labs:  Basename 08/17/11 0430 08/16/11 2014 08/16/11 2012 08/16/11 1247 08/16/11 0755  HGB 12.0 -- -- -- 12.2  HCT 36.0 -- -- -- 35.7*  PLT 304 -- -- -- 333  APTT 34 -- -- -- --  LABPROT 13.7 -- -- -- --  INR 1.03 -- -- -- --  HEPARINUNFRC 0.16* 0.14* -- -- --  CREATININE -- -- -- -- 0.60  CKTOTAL -- -- 28 28 33  CKMB -- -- 1.7 1.8 1.9  TROPONINI -- -- <0.30 <0.30 <0.30   Estimated Creatinine Clearance: 82.6 ml/min (by C-G formula based on Cr of 0.6).  Assessment: 61 yo female with chest pain for Heparin  Goal of Therapy:  Heparin level 0.3-0.7 units/ml   Plan:  Heparin 2000 units IV bolus, then increase Heparin 1400 units/hr.  Check heparin level in 6 hours.  Eddie Candle 08/17/2011,5:29 AM

## 2011-08-17 NOTE — Progress Notes (Signed)
DISCHARGED  HOME ACCOMPANIED BY HUSBAND, STABLE, DISCHARGE INSTRUCTIONS GIVEN TO PT. BELONGINGS WITH SPOUSE.

## 2011-08-17 NOTE — Discharge Summary (Signed)
Patient seen and examined and history reviewed. Agree with above findings and plan.  Thedora Hinders 08/17/2011 3:32 PM

## 2011-08-17 NOTE — Discharge Summary (Signed)
Patient ID: Rachel Clark,  MRN: [id number], DOB/AGE: [birth year] 61 y.o.  Admit date: 08/16/2011 Discharge date: 08/17/2011  Primary Care Provider: K. Haynes Dage Primary Cardiologist: P. [place]  Discharge Diagnoses Principal Problem:  *Chest pain Active Problems:  Coronary artery disease  Dyslipidemia  Hypothyroid  GERD (gastroesophageal reflux disease)   Allergies Allergies  Allergen Reactions  . Aspirin Other (See Comments)    GI upset  . Erythromycin     Gi upset  . Imdur (Isosorbide Mononitrate)     Pt does not recall reaction  . Lisinopril     cough  . Niacin And Related     Pt does not recall reactions   . Penicillins     Gi upset  . Statins     Muscle pain  . Sulfa Drugs Cross Reactors Swelling  . Tetracyclines & Related     Procedures  08/17/2011 Exercise Myoview  EF 59%, No ischemia.  Normal wall motion.  History of Present Illness  61 y/o female with the above problem list.  She was in her USOH until a few days prior to admission when she began to experience intermittent, sharp, burning, pressure-like, and also dull chest discomfort occurring in the epigastric area to the mid-chest.  Discomfort was sometimes radiating to the left jaw and was assoc with left shoulder tingling.  NTG seemed to help.  She presented to [facility] on 2/13 secondary to recurrent Ss and was treated with SL NTG and subsequently transferred to [facility] for further eval.  Hospital Course  Pt r/o for MI.  She was placed on PPI therapy and had no further chest pain.  Because she is intolerant to asa & plavix, she was placed on effient therapy (she had been previously instructed to do this but had not).  Pt underwent exercise myoview this am which showed no evidence of ischemia with normal LV function.  She will be discharged home today in good condition.  Discharge Vitals Blood pressure 144/83, pulse 73, temperature 97.4 F (36.3 C), temperature source Oral, resp. rate 18,  height 5\' 7"  (1.702 m), weight 192 lb 0.3 oz (87.1 kg), SpO2 98.00%.  Filed Weights   08/16/11 1130 08/17/11 0500  Weight: 192 lb 3.9 oz (87.2 kg) 192 lb 0.3 oz (87.1 kg)   Labs  CBC  Basename 08/17/11 0430 08/16/11 0755  WBC 9.1 7.9  NEUTROABS -- --  HGB 12.0 12.2  HCT 36.0 35.7*  MCV 85.3 85.2  PLT 304 333   Basic Metabolic Panel  Basename 08/17/11 0430 08/16/11 0755  NA 137 137  K 4.3 4.1  CL 104 102  CO2 26 26  GLUCOSE 114* 112*  BUN 12 13  CREATININE 0.61 0.60  CALCIUM 9.2 8.8  MG -- --  PHOS -- --   Liver Function Tests  Basename 08/16/11 1247  AST 10  ALT 10  ALKPHOS 89  BILITOT 0.2*  PROT 7.0  ALBUMIN 3.6    Basename 08/16/11 1247  LIPASE 21  AMYLASE --   Cardiac Enzymes  Basename 08/17/11 0430 08/16/11 2012 08/16/11 1247  CKTOTAL 26 28 28   CKMB 1.7 1.7 1.8  CKMBINDEX -- -- --  TROPONINI <0.30 <0.30 <0.30   Hemoglobin A1C  Basename 08/16/11 1247  HGBA1C 5.7*   Fasting Lipid Panel  Basename 08/17/11 0430  CHOL 268*  HDL 82  LDLCALC 165*  TRIG 107  CHOLHDL 3.3  LDLDIRECT --   Thyroid Function Tests  Basename 08/16/11 1247  TSH  1.143  T4TOTAL --  T3FREE --  THYROIDAB --    Disposition  Pt is being discharged home today in good condition.  Follow-up Plans & Appointments  Follow-up Information    Follow up with PICKETT,KATHERINE, PA. (As needed)       Follow up with Norma Fredrickson, NP on 09/14/2011. (10:30 ( Dr. Elvis Coil NP ))    Contact information:   [address], [address] [place] [address] [phone]         Discharge Medications  Medication List  As of 08/17/2011  3:22 PM   TAKE these medications         acetaminophen 500 MG tablet   Commonly known as: TYLENOL   Take 500 mg by mouth every 6 (six) hours as needed. For headache or muscle pain      albuterol (2.5 MG/3ML) 0.083% nebulizer solution   Commonly known as: PROVENTIL   Take 2.5 mg by nebulization every 6 (six) hours as needed.  For allergies      ASTEPRO 0.15 % Soln   Generic drug: Azelastine HCl   Place 1 spray into the nose as needed. For allergies      carbamazepine 100 MG 12 hr tablet   Commonly known as: TEGRETOL XR   Take 500 mg by mouth 2 (two) times daily. Takes 5 tabletsDAILY      cetirizine 10 MG tablet   Commonly known as: ZYRTEC   Take 10 mg by mouth as needed. For allergies      diphenhydrAMINE 25 MG tablet   Commonly known as: SOMINEX   Take 25 mg by mouth at bedtime as needed. For nasal passages open for breathing at night      ethosuximide 250 MG capsule   Commonly known as: ZARONTIN   Take 1,500 mg by mouth daily.      ezetimibe 10 MG tablet   Commonly known as: ZETIA   Take 10 mg by mouth daily.      fish oil-omega-3 fatty acids 1000 MG capsule   Take 1 g by mouth every other day.      fluticasone 50 MCG/BLIST diskus inhaler   Commonly known as: FLOVENT DISKUS   Inhale 1 puff into the lungs as needed. For allergies      levothyroxine 50 MCG tablet   Commonly known as: SYNTHROID, LEVOTHROID   Take 50 mcg by mouth daily.      naproxen sodium 220 MG tablet   Commonly known as: ANAPROX   Take 220 mg by mouth 2 (two) times daily with a meal. For body ache      nitroGLYCERIN 0.4 MG SL tablet   Commonly known as: NITROSTAT   Place 1 tablet (0.4 mg total) under the tongue every 5 (five) minutes x 3 doses as needed for chest pain.      pantoprazole 40 MG tablet   Commonly known as: PROTONIX   Take 1 tablet (40 mg total) by mouth daily.      prasugrel 10 MG Tabs   Commonly known as: EFFIENT   Take 1 tablet (10 mg total) by mouth daily.      VIVELLE-DOT 0.075 MG/24HR   Generic drug: estradiol   Place 1 patch onto the skin 2 (two) times a week. Due for change on 08-17-11            Outstanding Labs/Studies  None  Duration of Discharge Encounter   Greater than 30 minutes including physician time.  Signed,  Nicolasa Ducking NP 08/17/2011, 3:22 PM

## 2011-08-17 NOTE — Discharge Instructions (Signed)
***  PLEASE REMEMBER TO BRING ALL OF YOUR MEDICATIONS TO EACH OF YOUR FOLLOW-UP OFFICE VISITS.  

## 2011-08-18 MED FILL — Regadenoson IV Inj 0.4 MG/5ML (0.08 MG/ML): INTRAVENOUS | Qty: 5 | Status: AC

## 2011-09-05 ENCOUNTER — Other Ambulatory Visit (HOSPITAL_BASED_OUTPATIENT_CLINIC_OR_DEPARTMENT_OTHER): Payer: BC Managed Care – PPO | Admitting: Lab

## 2011-09-05 DIAGNOSIS — F411 Generalized anxiety disorder: Secondary | ICD-10-CM

## 2011-09-05 DIAGNOSIS — D472 Monoclonal gammopathy: Secondary | ICD-10-CM

## 2011-09-05 DIAGNOSIS — E039 Hypothyroidism, unspecified: Secondary | ICD-10-CM

## 2011-09-05 DIAGNOSIS — IMO0001 Reserved for inherently not codable concepts without codable children: Secondary | ICD-10-CM

## 2011-09-05 LAB — CBC WITH DIFFERENTIAL/PLATELET
Basophils Absolute: 0 10*3/uL (ref 0.0–0.1)
Eosinophils Absolute: 0.2 10*3/uL (ref 0.0–0.5)
LYMPH%: 38 % (ref 14.0–49.7)
MCV: 85.9 fL (ref 79.5–101.0)
MONO%: 6.4 % (ref 0.0–14.0)
NEUT#: 3.9 10*3/uL (ref 1.5–6.5)
NEUT%: 52 % (ref 38.4–76.8)
Platelets: 324 10*3/uL (ref 145–400)
RBC: 3.75 10*6/uL (ref 3.70–5.45)

## 2011-09-07 LAB — COMPREHENSIVE METABOLIC PANEL
Alkaline Phosphatase: 94 U/L (ref 39–117)
BUN: 17 mg/dL (ref 6–23)
Creatinine, Ser: 0.61 mg/dL (ref 0.50–1.10)
Glucose, Bld: 94 mg/dL (ref 70–99)
Total Bilirubin: 0.2 mg/dL — ABNORMAL LOW (ref 0.3–1.2)

## 2011-09-07 LAB — PROTEIN ELECTROPHORESIS, SERUM
Alpha-1-Globulin: 5.3 % — ABNORMAL HIGH (ref 2.9–4.9)
Alpha-2-Globulin: 11.6 % (ref 7.1–11.8)
Beta Globulin: 5.4 % (ref 4.7–7.2)
Gamma Globulin: 15.7 % (ref 11.1–18.8)
M-Spike, %: 0.44 g/dL

## 2011-09-07 LAB — KAPPA/LAMBDA LIGHT CHAINS

## 2011-09-11 ENCOUNTER — Encounter: Payer: Self-pay | Admitting: *Deleted

## 2011-09-11 ENCOUNTER — Ambulatory Visit: Payer: BC Managed Care – PPO | Admitting: Oncology

## 2011-09-14 ENCOUNTER — Encounter: Payer: BC Managed Care – PPO | Admitting: Nurse Practitioner

## 2011-09-18 ENCOUNTER — Telehealth: Payer: Self-pay | Admitting: Oncology

## 2011-09-18 ENCOUNTER — Encounter: Payer: Self-pay | Admitting: Oncology

## 2011-09-18 ENCOUNTER — Ambulatory Visit (HOSPITAL_BASED_OUTPATIENT_CLINIC_OR_DEPARTMENT_OTHER): Payer: BC Managed Care – PPO | Admitting: Oncology

## 2011-09-18 VITALS — BP 139/79 | HR 94 | Temp 96.7°F | Ht 67.0 in | Wt 194.1 lb

## 2011-09-18 DIAGNOSIS — D649 Anemia, unspecified: Secondary | ICD-10-CM | POA: Insufficient documentation

## 2011-09-18 DIAGNOSIS — IMO0001 Reserved for inherently not codable concepts without codable children: Secondary | ICD-10-CM

## 2011-09-18 DIAGNOSIS — D472 Monoclonal gammopathy: Secondary | ICD-10-CM

## 2011-09-18 NOTE — Progress Notes (Signed)
[facility] OFFICE PROGRESS NOTE  Cc:  PICKETT,KATHERINE, PA, PA  DIAGNOSIS: Monoclonal gammopathy of undetermined significance (MGUS).  CURRENT THERAPY:  Active watchful observation.  INTERVAL HISTORY: Rachel Clark 61 y.o. female returns for regular follow up.  She has had fatigue, diffuse muscle and bone pain for the past few years which has been attributed to fibromyalgia.  She denies worsening of her chronic lower back pain.  She denies leg weakness, paresthesia.  She is still independent of all activities of daily living. She denies visible source of bleeding.  She denies SOB, CP, palpitation.   Patient denies headache, visual changes, confusion, drenching night sweats, palpable lymph node swelling, mucositis, odynophagia, dysphagia, nausea vomiting, jaundice, chest pain, palpitation, shortness of breath, dyspnea on exertion, productive cough, gum bleeding, epistaxis, hematemesis, hemoptysis, abdominal pain, abdominal swelling, early satiety, melena, hematochezia, hematuria, skin rash, spontaneous bleeding, joint swelling, joint pain, heat or cold intolerance, bowel bladder incontinence, back pain, focal motor weakness, paresthesia, depression, suicidal or homocidal ideation, feeling hopelessness.   Past Medical History  Diagnosis Date  . Coronary artery disease     Has had remote NSTEMI with PCI to the RCA; S/P CABG in 2007; LAST CATH WITH MODERATE DISEASE IN A VERY SMALL DIAGONAL BRANCH; SHE IS MANAGED MEDICALLY  . Dyslipidemia   . Seizure disorder   . GERD (gastroesophageal reflux disease)   . History of fibromyalgia   . Chest pain   . Monoclonal gammopathy of unknown significance   . Hypothyroid   . Aspirin intolerance   . Statin intolerance   . Anemia 09/18/2011    Past Surgical History  Procedure Date  . Cardiac catheterization 05/20/2009    obstructive native vessel disease in LAD, RCA, and first diagonal, patent vein graft to distal RCA and LIMA to  LAD,normal. ef 60%  . Coronary artery bypass graft     SAPHENOUS VEIN GRAFT TO THE DISTAL RIGHT CORONARY IN AN LIMA GRAFT TO THE LAD.  Marland Kitchen Partial hysterectomy   . Posterior laminectomy / decompression cervical spine     Current Outpatient Prescriptions  Medication Sig Dispense Refill  . acetaminophen (TYLENOL) 500 MG tablet Take 500 mg by mouth every 6 (six) hours as needed. For headache or muscle pain      . albuterol (PROVENTIL) (2.5 MG/3ML) 0.083% nebulizer solution Take 2.5 mg by nebulization every 6 (six) hours as needed. For allergies      . Azelastine HCl (ASTEPRO) 0.15 % SOLN Place 1 spray into the nose as needed. For allergies      . carbamazepine (TEGRETOL XR) 100 MG 12 hr tablet Take 500 mg by mouth 2 (two) times daily. Takes 5 tabletsDAILY      . cetirizine (ZYRTEC) 10 MG tablet Take 10 mg by mouth as needed. For allergies      . diphenhydrAMINE (SOMINEX) 25 MG tablet Take 25 mg by mouth at bedtime as needed. For nasal passages open for breathing at night      . ethosuximide (ZARONTIN) 250 MG capsule Take 1,500 mg by mouth daily.       Marland Kitchen ezetimibe (ZETIA) 10 MG tablet Take 10 mg by mouth daily.      . fish oil-omega-3 fatty acids 1000 MG capsule Take 1 g by mouth every other day.       . fluticasone (FLOVENT DISKUS) 50 MCG/BLIST diskus inhaler Inhale 1 puff into the lungs as needed. For allergies      . levothyroxine (SYNTHROID, LEVOTHROID) 50 MCG  tablet Take 50 mcg by mouth daily.       . naproxen sodium (ANAPROX) 220 MG tablet Take 220 mg by mouth 2 (two) times daily with a meal. For body ache      . nitroGLYCERIN (NITROSTAT) 0.4 MG SL tablet Place 1 tablet (0.4 mg total) under the tongue every 5 (five) minutes x 3 doses as needed for chest pain.  25 tablet  3  . pantoprazole (PROTONIX) 40 MG tablet Take 1 tablet (40 mg total) by mouth daily.  30 tablet  6  . prasugrel (EFFIENT) 10 MG TABS Take 1 tablet (10 mg total) by mouth daily.  30 tablet  6  . VIVELLE-DOT 0.075 MG/24HR  Place 1 patch onto the skin 2 (two) times a week. Due for change on 08-17-11        ALLERGIES:  is allergic to aspirin; erythromycin; imdur; lisinopril; niacin and related; penicillins; statins; sulfa drugs cross reactors; and tetracyclines & related.  REVIEW OF SYSTEMS:  The rest of the 14-point review of system was negative.   Filed Vitals:   09/18/11 1501  BP: 139/79  Pulse: 94  Temp: 96.7 F (35.9 C)   Wt Readings from Last 3 Encounters:  09/18/11 194 lb 1.6 oz (88.043 kg)  04/07/11 196 lb 3.2 oz (88.996 kg)  08/17/11 192 lb 0.3 oz (87.1 kg)   ECOG Performance status: 1  PHYSICAL EXAMINATION:   General:  well-nourished in no acute distress.  Eyes:  no scleral icterus.  ENT:  There were no oropharyngeal lesions.  Neck was without thyromegaly.  Lymphatics:  Negative cervical, supraclavicular or axillary adenopathy.  Respiratory: lungs were clear bilaterally without wheezing or crackles.  Cardiovascular:  Regular rate and rhythm, S1/S2, without murmur, rub or gallop.  There was no pedal edema.  GI:  abdomen was soft, flat, nontender, nondistended, without organomegaly.  She declined rectal exam despite my explanation that she has anemia and needs work up.  Muscoloskeletal:  no spinal tenderness of palpation of vertebral spine.  Skin exam was without echymosis, petichae.  Neuro exam was nonfocal.  Patient was able to get on and off exam table without assistance.  Gait was normal.  Patient was alerted and oriented.  Attention was good.   Language was appropriate.  Mood was normal without depression.  Speech was not pressured.  Thought content was not tangential.      LABORATORY/RADIOLOGY DATA:  Lab Results  Component Value Date   WBC 7.5 09/05/2011   HGB 10.5* 09/05/2011   HCT 32.2* 09/05/2011   PLT 324 09/05/2011   GLUCOSE 94 09/05/2011   CHOL 268* 08/17/2011   TRIG 107 08/17/2011   HDL 82 08/17/2011   LDLCALC 165* 08/17/2011   ALT 10 09/05/2011   AST 12 09/05/2011   NA 139 09/05/2011   K 4.4  09/05/2011   CL 105 09/05/2011   CREATININE 0.61 09/05/2011   BUN 17 09/05/2011   CO2 23 09/05/2011   TSH 1.143 08/16/2011   INR 1.03 08/17/2011   HGBA1C 5.7* 08/16/2011    ASSESSMENT AND PLAN:  1. Monoclonal gammopathy of unknown significance (MGUS):   I discussed with Rachel Clark today that again her monoclonal gammopathy of unknown significance is stable and there is no clinical concern for progression to active myeloma.  She does have slight anemia compared to before.  However, she does not have renal insufficiency, hypercalcemia.  Her M spike is improving compared to before.  There is no strong indication that her  MGUS has progressed to active myeloma.   I again recommended watchful observation.  She has been stable for awhile.  I will keep the frequency to observation in 5 months. 2. Age-appropriate cancer screening.  She said that she is up-to-date with her mammogram and Papsmear.  3. Anemia:  Unclear etiology.  She declined rectal exam today.  I will check her repeat CBC with iron panel/Vit B12 in the next 2 months if it worsens.  She never had age appropriate colon cancer screening.  I thus referred her to [facility] for work up.  She agreed with this plan.  4. Fibromyalgia:  Diffuse muscle and bone pain.  She f/u with Dr. Nickola Major.     The length of time of the face-to-face encounter was 15 minutes. More than 50% of time was spent counseling and coordination of care.

## 2011-09-18 NOTE — Telephone Encounter (Signed)
Gv pt appt for may-jan2014.  called [facility] to scheduled appt and was to told to have pt call there billing dept before they can scheduled appt. provided phone to pt

## 2011-09-18 NOTE — Patient Instructions (Addendum)
1.  Diagnosis:  Monoclonal gammopathy of undetermined significance (MGUS):  No obvious evidence of progression to active myeloma (lowering M-spike, no kidney problem, normal calcium).  Will continue observation.  In the future, if you have worsening anemia, or other signs of disease progression, we may consider bone marrow biopsy.  2.  Anemia:  Unclear reason.  We'll continue to observe.  I'll recheck your blood count in 2 months.  3.  Follow up:  With Dr. Lodema Pilot NP [place] Pare in about 5 months, with Dr. Gaylyn Rong in about 10 months.  (Lab check about one week before each of these visits).

## 2011-11-16 ENCOUNTER — Telehealth: Payer: Self-pay | Admitting: *Deleted

## 2011-11-16 NOTE — Telephone Encounter (Signed)
Pt called, confirmed lab appt for tomorrow.

## 2011-11-17 ENCOUNTER — Other Ambulatory Visit (HOSPITAL_BASED_OUTPATIENT_CLINIC_OR_DEPARTMENT_OTHER): Payer: BC Managed Care – PPO | Admitting: Lab

## 2011-11-17 ENCOUNTER — Other Ambulatory Visit: Payer: Self-pay | Admitting: Oncology

## 2011-11-17 ENCOUNTER — Encounter: Payer: Self-pay | Admitting: Oncology

## 2011-11-17 DIAGNOSIS — D649 Anemia, unspecified: Secondary | ICD-10-CM

## 2011-11-17 DIAGNOSIS — D509 Iron deficiency anemia, unspecified: Secondary | ICD-10-CM

## 2011-11-17 DIAGNOSIS — D500 Iron deficiency anemia secondary to blood loss (chronic): Secondary | ICD-10-CM | POA: Insufficient documentation

## 2011-11-17 DIAGNOSIS — D472 Monoclonal gammopathy: Secondary | ICD-10-CM

## 2011-11-17 HISTORY — DX: Iron deficiency anemia, unspecified: D50.9

## 2011-11-17 LAB — CBC WITH DIFFERENTIAL/PLATELET
Basophils Absolute: 0 10*3/uL (ref 0.0–0.1)
EOS%: 5.9 % (ref 0.0–7.0)
HGB: 9.1 g/dL — ABNORMAL LOW (ref 11.6–15.9)
MCH: 23.7 pg — ABNORMAL LOW (ref 25.1–34.0)
MCV: 74.5 fL — ABNORMAL LOW (ref 79.5–101.0)
MONO%: 7.5 % (ref 0.0–14.0)
RBC: 3.83 10*6/uL (ref 3.70–5.45)
RDW: 15.9 % — ABNORMAL HIGH (ref 11.2–14.5)

## 2011-11-17 LAB — IRON AND TIBC: %SAT: 5 % — ABNORMAL LOW (ref 20–55)

## 2011-11-17 LAB — FERRITIN: Ferritin: 2 ng/mL — ABNORMAL LOW (ref 10–291)

## 2011-11-17 NOTE — Progress Notes (Signed)
I called and talked to patient today. Her Hgb has worsened. She has evidence of iron deficiency anemia. She has fatigue, weakness but no obvious source of bleeding.   I recommended 1. Start oral iron (either NuIron 150mg  PO daily or SlowFe 325mg  PO daily; either one for one week once daily and if tolerate well, increase to BID). 2. I recommended IV Ferahem to increase her iron storage and Hgb while waiting for oral iron to work. 3. I strongly recomended referral to GI ASAP. I offered to refer her to GI myself. Patient declined that, and preferred to have her PCP refer her.   I appreciate her PCP, Laurell Josephs, C-PA in referring patient to GI ASAP for appropriate work up.

## 2011-11-20 ENCOUNTER — Encounter: Payer: Self-pay | Admitting: *Deleted

## 2011-11-20 ENCOUNTER — Telehealth: Payer: Self-pay | Admitting: Cardiology

## 2011-11-20 ENCOUNTER — Telehealth: Payer: Self-pay | Admitting: *Deleted

## 2011-11-20 ENCOUNTER — Telehealth: Payer: Self-pay | Admitting: Oncology

## 2011-11-20 NOTE — Telephone Encounter (Signed)
Spoke with pt, aware of dr jordan's recommendations. 

## 2011-11-20 NOTE — Telephone Encounter (Signed)
Pt left VM asking to schedule IV Faraheme appt this Friday.   Called Melissa in scheduling and asked her to contact pt to schedule appt..  She agreed.

## 2011-11-20 NOTE — Telephone Encounter (Signed)
Left message for pt, will forward for dr [place] review and approval.

## 2011-11-20 NOTE — Telephone Encounter (Signed)
lmonvm for pt re appts for 5/23, 6/17, and 7/17. Other appts remain the same and pt to get new schedule 5/23.

## 2011-11-20 NOTE — Progress Notes (Signed)
Faxed Dr. Lodema Pilot progress note dated 11/17/11 to Laurell Josephs, C-PA at [facility] at fax [phone],  Per Dr. Lodema Pilot request.

## 2011-11-20 NOTE — Telephone Encounter (Signed)
She may stop Effient 7 days prior to colonoscopy. Rachel Bechler [place] MD, [facility]

## 2011-11-20 NOTE — Telephone Encounter (Signed)
Please return call to patient at [phone], regarding effient RX hold for colonoscopy.

## 2011-11-23 ENCOUNTER — Ambulatory Visit (HOSPITAL_BASED_OUTPATIENT_CLINIC_OR_DEPARTMENT_OTHER): Payer: BC Managed Care – PPO

## 2011-11-23 VITALS — BP 145/71 | HR 77 | Temp 97.4°F

## 2011-11-23 DIAGNOSIS — D509 Iron deficiency anemia, unspecified: Secondary | ICD-10-CM

## 2011-11-23 MED ORDER — SODIUM CHLORIDE 0.9 % IV SOLN
1020.0000 mg | Freq: Once | INTRAVENOUS | Status: AC
  Administered 2011-11-23: 1020 mg via INTRAVENOUS
  Filled 2011-11-23: qty 34

## 2011-11-23 NOTE — Patient Instructions (Signed)
Iron is used to make healthy red blood cells, which carry oxygen and nutrients through the body. This medicine is used to treat people who cannot take iron by mouth and have low levels of iron in the blood.  This medicine is for injection into a vein. It is given by a health care professional in a hospital or clinic setting.  Side effects that you should report to your doctor or health care professional as soon as possible: -allergic reactions like skin rash, itching or hives, swelling of the face, lips, or tongue -blue lips, nails, or skin -breathing problems -changes in blood pressure -chest pain -confusion -fast, irregular heartbeat -feeling faint or lightheaded, falls -fever or chills -flushing, sweating, or hot feelings -joint or muscle aches or pains -pain, tingling, numbness in the hands or feet -seizures -unusually weak or tired Side effects that usually do not require medical attention (report to your doctor or health care professional if they continue or are bothersome): -change in taste (metallic taste) -diarrhea -headache -irritation at site where injected -nausea, vomiting -stomach upset This list may not describe all possible side effects. Call your doctor for medical advice about side effects. You may report side effects to FDA at 1-800-FDA-1088. Where should I keep my medicine? This drug is given in a hospital or clinic and will not be stored at home. NOTE: This sheet is a summary. It may not cover all possible information. If you have questions about this medicine, talk to your doctor, pharmacist, or health care provider.  2012, Elsevier/Gold Standard. (11/05/2007 4:59:50 PM)

## 2011-11-30 ENCOUNTER — Encounter: Payer: Self-pay | Admitting: Cardiology

## 2011-11-30 ENCOUNTER — Other Ambulatory Visit: Payer: Self-pay | Admitting: Oncology

## 2011-11-30 ENCOUNTER — Telehealth: Payer: Self-pay | Admitting: Oncology

## 2011-11-30 DIAGNOSIS — C189 Malignant neoplasm of colon, unspecified: Secondary | ICD-10-CM

## 2011-11-30 NOTE — Telephone Encounter (Signed)
called pt and scheduled appt for 06/03

## 2011-12-04 ENCOUNTER — Telehealth: Payer: Self-pay | Admitting: Oncology

## 2011-12-04 ENCOUNTER — Other Ambulatory Visit (HOSPITAL_BASED_OUTPATIENT_CLINIC_OR_DEPARTMENT_OTHER): Payer: BC Managed Care – PPO | Admitting: Lab

## 2011-12-04 ENCOUNTER — Encounter: Payer: Self-pay | Admitting: Oncology

## 2011-12-04 ENCOUNTER — Ambulatory Visit (HOSPITAL_BASED_OUTPATIENT_CLINIC_OR_DEPARTMENT_OTHER): Payer: BC Managed Care – PPO | Admitting: Oncology

## 2011-12-04 VITALS — BP 154/83 | HR 81 | Temp 97.0°F | Ht 67.0 in | Wt 193.1 lb

## 2011-12-04 DIAGNOSIS — C189 Malignant neoplasm of colon, unspecified: Secondary | ICD-10-CM

## 2011-12-04 DIAGNOSIS — C184 Malignant neoplasm of transverse colon: Secondary | ICD-10-CM

## 2011-12-04 DIAGNOSIS — IMO0001 Reserved for inherently not codable concepts without codable children: Secondary | ICD-10-CM

## 2011-12-04 DIAGNOSIS — D472 Monoclonal gammopathy: Secondary | ICD-10-CM

## 2011-12-04 DIAGNOSIS — Z85038 Personal history of other malignant neoplasm of large intestine: Secondary | ICD-10-CM | POA: Insufficient documentation

## 2011-12-04 DIAGNOSIS — D509 Iron deficiency anemia, unspecified: Secondary | ICD-10-CM

## 2011-12-04 DIAGNOSIS — D649 Anemia, unspecified: Secondary | ICD-10-CM

## 2011-12-04 HISTORY — DX: Malignant neoplasm of colon, unspecified: C18.9

## 2011-12-04 LAB — CBC WITH DIFFERENTIAL/PLATELET
BASO%: 0.5 % (ref 0.0–2.0)
HCT: 33.2 % — ABNORMAL LOW (ref 34.8–46.6)
LYMPH%: 25.3 % (ref 14.0–49.7)
MCHC: 31.6 g/dL (ref 31.5–36.0)
MCV: 78.1 fL — ABNORMAL LOW (ref 79.5–101.0)
MONO%: 6.6 % (ref 0.0–14.0)
NEUT%: 63.4 % (ref 38.4–76.8)
Platelets: 299 10*3/uL (ref 145–400)
RBC: 4.25 10*6/uL (ref 3.70–5.45)
nRBC: 0 % (ref 0–0)

## 2011-12-04 LAB — COMPREHENSIVE METABOLIC PANEL
ALT: 10 U/L (ref 0–35)
Alkaline Phosphatase: 99 U/L (ref 39–117)
BUN: 15 mg/dL (ref 6–23)
Chloride: 104 mEq/L (ref 96–112)
Creatinine, Ser: 0.68 mg/dL (ref 0.50–1.10)
Glucose, Bld: 124 mg/dL — ABNORMAL HIGH (ref 70–99)
Potassium: 4.4 mEq/L (ref 3.5–5.3)
Total Protein: 6.9 g/dL (ref 6.0–8.3)

## 2011-12-04 NOTE — Progress Notes (Signed)
[facility]  [phone] [phone]   OFFICE PROGRESS NOTE   Cc:  PICKETT,KATHERINE, PA, PA  NEW DIAGNOSIS:  Newly diagnosed transverse colon adenocarcinoma; final path pending from Dr. Marge Duncans colonoscopy from late May 2013.   CURRENT THERAPY:  Due for surgical evaluation by Dr.  Dwain Sarna this week.    INTERVAL HISTORY: Rachel Clark 61 y.o. female returns for work in appointment.  She was referred to me last year for MGUS without evidence of active myeloma.  However, I noticed in March 2013 that she developed iron deficiency anemia.  Finally, she had a chance to see Dr. Bosie Clos and had colonoscopy last week that showed transverse colon mass.  Final path is still pending; but prelim was though to be "carcinoma."   She therefore requested urgent visit for discussion.  She is here with her husband.  She reported intermittent, mild midepigastric pain which is crampy, not related to food or bowel movement; no radiation of the pain.  She has not seen any hematochezia, melena.  She still has diffuse joint pain which has been stable for years.   Patient denies fatigue, headache, visual changes, confusion, drenching night sweats, palpable lymph node swelling, mucositis, odynophagia, dysphagia, nausea vomiting, jaundice, chest pain, palpitation, shortness of breath, dyspnea on exertion, productive cough, gum bleeding, epistaxis, hematemesis, hemoptysis, abdominal swelling, early satiety, melena, hematochezia, hematuria, skin rash, spontaneous bleeding, joint swelling, heat or cold intolerance, bowel bladder incontinence, back pain, focal motor weakness, paresthesia, depression, suicidal or homocidal ideation, feeling hopelessness.   Past Medical History  Diagnosis Date  . Coronary artery disease     Has had remote NSTEMI with PCI to the RCA; S/P CABG in 2007; LAST CATH WITH MODERATE DISEASE IN A VERY SMALL DIAGONAL BRANCH; SHE IS MANAGED MEDICALLY  .  Dyslipidemia   . Seizure disorder   . GERD (gastroesophageal reflux disease)   . History of fibromyalgia   . Chest pain   . Monoclonal gammopathy of unknown significance   . Hypothyroid   . Aspirin intolerance   . Statin intolerance   . Anemia 09/18/2011  . Iron deficiency anemia 11/17/2011  . Colon cancer 12/04/2011    Past Surgical History  Procedure Date  . Cardiac catheterization 05/20/2009    obstructive native vessel disease in LAD, RCA, and first diagonal, patent vein graft to distal RCA and LIMA to LAD,normal. ef 60%  . Coronary artery bypass graft     SAPHENOUS VEIN GRAFT TO THE DISTAL RIGHT CORONARY IN AN LIMA GRAFT TO THE LAD.  Marland Kitchen Partial hysterectomy   . Posterior laminectomy / decompression cervical spine     Current Outpatient Prescriptions  Medication Sig Dispense Refill  . acetaminophen (TYLENOL) 500 MG tablet Take 500 mg by mouth every 6 (six) hours as needed. For headache or muscle pain      . albuterol (PROVENTIL) (2.5 MG/3ML) 0.083% nebulizer solution Take 2.5 mg by nebulization every 6 (six) hours as needed. For allergies      . Azelastine HCl (ASTEPRO) 0.15 % SOLN Place 1 spray into the nose as needed. For allergies      . carbamazepine (TEGRETOL XR) 100 MG 12 hr tablet Take 500 mg by mouth 2 (two) times daily.       . cetirizine (ZYRTEC) 10 MG tablet Take 10 mg by mouth as needed. For allergies      . diphenhydrAMINE (SOMINEX) 25 MG tablet Take 25 mg by mouth at bedtime as needed. For nasal  passages open for breathing at night      . ethosuximide (ZARONTIN) 250 MG capsule Take 1,500 mg by mouth daily.       . fish oil-omega-3 fatty acids 1000 MG capsule Take 1 g by mouth every other day.       . fluticasone (FLOVENT DISKUS) 50 MCG/BLIST diskus inhaler Inhale 1 puff into the lungs as needed. For allergies      . levothyroxine (SYNTHROID, LEVOTHROID) 50 MCG tablet Take 50 mcg by mouth daily.       . naproxen sodium (ANAPROX) 220 MG tablet Take 220 mg by mouth 2  (two) times daily as needed. For body ache      . nitroGLYCERIN (NITROSTAT) 0.4 MG SL tablet Place 1 tablet (0.4 mg total) under the tongue every 5 (five) minutes x 3 doses as needed for chest pain.  25 tablet  3  . pantoprazole (PROTONIX) 40 MG tablet Take 1 tablet (40 mg total) by mouth daily.  30 tablet  6  . VIVELLE-DOT 0.075 MG/24HR Place 1 patch onto the skin 2 (two) times a week. Due for change on 08-17-11      . prasugrel (EFFIENT) 10 MG TABS Take 1 tablet (10 mg total) by mouth daily.  30 tablet  6    ALLERGIES:  is allergic to aspirin; erythromycin; imdur; lisinopril; niacin and related; penicillins; statins; sulfa drugs cross reactors; and tetracyclines & related.  REVIEW OF SYSTEMS:  The rest of the 14-point review of system was negative.   Filed Vitals:   12/04/11 1445  BP: 154/83  Pulse: 81  Temp: 97 F (36.1 C)   Wt Readings from Last 3 Encounters:  12/04/11 193 lb 1.6 oz (87.59 kg)  09/18/11 194 lb 1.6 oz (88.043 kg)  04/07/11 196 lb 3.2 oz (88.996 kg)   ECOG Performance status: 0  PHYSICAL EXAMINATION:   General:  Mildly obese woman in no acute distress.  Eyes:  no scleral icterus.  ENT:  There were no oropharyngeal lesions.  Neck was without thyromegaly.  Lymphatics:  Negative cervical, supraclavicular or axillary adenopathy.  Respiratory: lungs were clear bilaterally without wheezing or crackles.  Cardiovascular:  Regular rate and rhythm, S1/S2, without murmur, rub or gallop.  There was no pedal edema.  GI:  abdomen was soft, flat, nontender, nondistended, without organomegaly.  Muscoloskeletal:  no spinal tenderness of palpation of vertebral spine.  Skin exam was without echymosis, petichae.  Neuro exam was nonfocal.  Patient was able to get on and off exam table without assistance.  Gait was normal.  Patient was alerted and oriented.  Attention was good.   Language was appropriate.  Mood was normal without depression.  Speech was not pressured.  Thought content was not  tangential.     LABORATORY/RADIOLOGY DATA:  Lab Results  Component Value Date   WBC 8.8 12/04/2011   HGB 10.5* 12/04/2011   HCT 33.2* 12/04/2011   PLT 299 12/04/2011   GLUCOSE 124* 12/04/2011   CHOL 268* 08/17/2011   TRIG 107 08/17/2011   HDL 82 08/17/2011   LDLCALC 165* 08/17/2011   ALKPHOS 99 12/04/2011   ALT 10 12/04/2011   AST 10 12/04/2011   NA 137 12/04/2011   K 4.4 12/04/2011   CL 104 12/04/2011   CREATININE 0.68 12/04/2011   BUN 15 12/04/2011   CO2 23 12/04/2011   INR 1.03 08/17/2011   HGBA1C 5.7* 08/16/2011      ASSESSMENT AND PLAN:   1. Monoclonal gammopathy of  unknown significance (MGUS): This has been stable.  Next myeloma panel is due in 03/2012 (6 months from last one in 09/2011).  There has been no evidence of active myeloma (no renalinsufficiency/hypercalcemia) 2. Newly diagnosed transverse colon cancer:    - Pending path to confirm adenocarcinoma.  Other potential diagnosis:  GIST, neuroendocrine.  - Staging:  CT chest/abdomen/pelvis to rule out metastatic disease.  CEA today was normal.  Final staging will not be available until resection.   - Prognosis:  Pending final pathologic staging.  - Treatment:  The rest of the conversation was contingent upon the assumption of colonic adenocarcinoma.  Patient and husband were curious about the possibilities of treatment.  If she has stage I disease, chance of cure with resection alone can approach 90%.  She would not need adjuvant chemo.  If she has stage II disease, depending on her MSI status; and pathologic features, she may or may not benefit from adjuvant chemo.  Chance of cure with resection alone in stage II is about 70-80%.  If she has stage III disease, then chance of cure with resection alone is about 50%.  She would benefit from adjuvant chemotherapy in the form of FOLFOX.  If she has stage IV disease, then palliative chemotherapy alone is the treatment with role for palliative resection or radiation in selected cases.    - She and her  husband expressed understanding of this plan for final path, CT to rule out stage IV, and evaluation with Dr. Dwain Sarna for curative resection if not stage IV.  3. Anemia: due to colon cancer.  I advised her to continue oral iron NuIron 150mg  PO BID with VitC.  4. Fibromyalgia: Diffuse muscle and bone pain. She f/u with Dr. Nickola Major.  5. Follow up:  With me in clinic in about 6-8 weeks but sooner if she has metastatic diease.    The length of time of the face-to-face encounter was 25 minutes. More than 50% of time was spent counseling and coordination of care.

## 2011-12-04 NOTE — Telephone Encounter (Signed)
Gave pt appt for June 2013 Ct and July 2013 lab and MD, gave pt oral contrast, NPO 4 hrs prior to CT

## 2011-12-04 NOTE — Patient Instructions (Signed)
A.  Suspected colon cancer:  Pending pathology from colonoscopy.  B.  Staging:  Need CT chest/abdomen/pelvis with contrast soon.  C.  If no sign of distant disease (liver, lungs), then resection by Dr. Dwain Sarna.  Depending on the stage of the colon cancer, you may or may not need chemotherapy after resection.  D.  If the cancer is stage IV, then recommendation will be chemotherapy only.   E.  Follow up in about 6-8 weeks to see if chemotherapy is needed.

## 2011-12-06 ENCOUNTER — Ambulatory Visit (INDEPENDENT_AMBULATORY_CARE_PROVIDER_SITE_OTHER): Payer: BC Managed Care – PPO | Admitting: General Surgery

## 2011-12-06 ENCOUNTER — Encounter (INDEPENDENT_AMBULATORY_CARE_PROVIDER_SITE_OTHER): Payer: Self-pay

## 2011-12-06 ENCOUNTER — Encounter (INDEPENDENT_AMBULATORY_CARE_PROVIDER_SITE_OTHER): Payer: Self-pay | Admitting: General Surgery

## 2011-12-06 VITALS — BP 152/88 | HR 80 | Temp 97.8°F | Resp 16 | Ht 69.0 in | Wt 192.0 lb

## 2011-12-06 DIAGNOSIS — C189 Malignant neoplasm of colon, unspecified: Secondary | ICD-10-CM

## 2011-12-06 DIAGNOSIS — G40409 Other generalized epilepsy and epileptic syndromes, not intractable, without status epilepticus: Secondary | ICD-10-CM

## 2011-12-06 HISTORY — DX: Other generalized epilepsy and epileptic syndromes, not intractable, without status epilepticus: G40.409

## 2011-12-06 NOTE — Progress Notes (Signed)
Patient ID: Rachel Clark, female   DOB: 05/10/1951, 60 y.o.   MRN: [id number]  Chief Complaint  Patient presents with  . GI Problem    new pt- eval colon mass    HPI Rachel Clark is a 60 y.o. female.  Referred by Dr. Schooler HPI This is a 60-year-old female with a history of monoclonal gammopathy of unknown significance that is followed by Dr. Ha. She has had iron deficiency anemia and has had some occasional dizziness or lightheadedness. She reports no changes in her bowel movements, any black stools, hematochezia, or any loss of her appetite. She has lost about 30 pounds over the last 6 months but this has been intentional. She was referred for colonoscopy which she is now undergone which shows a frond-like infiltrative nonobstructing large mass in the transverse colon. This is partially circumferential about half of the lumen circumference. There was no bleeding present at the time. This was tattooed with India ink. 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 The remainder of her colonoscopy was without any other lesions. She has recently also seen her medical oncologist was ordered a CT scan and she is undergoing tomorrow and presents today to discuss surgery.  Past Medical History  Diagnosis Date  . Coronary artery disease     Has had remote NSTEMI with PCI to the RCA; S/P CABG in 2007; LAST CATH WITH MODERATE DISEASE IN A VERY SMALL DIAGONAL BRANCH; SHE IS MANAGED MEDICALLY  . Dyslipidemia   . Seizure disorder   . GERD (gastroesophageal reflux disease)   . History of fibromyalgia   . Chest pain   . Monoclonal gammopathy of unknown significance   . Hypothyroid   . Aspirin intolerance   . Statin intolerance   . Anemia 09/18/2011  . Iron deficiency anemia 11/17/2011  . Colon cancer 12/04/2011  . Hyperlipidemia   . Myocardial infarction 2007  . Seizures 12/06/11    last seizure over 40 yrs ago    Past Surgical History  Procedure Date  . Cardiac catheterization  05/20/2009    obstructive native vessel disease in LAD, RCA, and first diagonal, patent vein graft to distal RCA and LIMA to LAD,normal. ef 60%  . Coronary artery bypass graft     SAPHENOUS VEIN GRAFT TO THE DISTAL RIGHT CORONARY IN AN LIMA GRAFT TO THE LAD.  . Partial hysterectomy   . Posterior laminectomy / decompression cervical spine     Family History  Problem Relation Age of Onset  . Heart attack Father   . Heart disease Father   . Heart attack Brother 40  . Cancer Mother     lung    Social History History  Substance Use Topics  . Smoking status: Former Smoker -- 0.3 packs/day for 20 years    Types: Cigarettes    Quit date: 12/23/1990  . Smokeless tobacco: Never Used  . Alcohol Use: Yes     occ wine    Allergies  Allergen Reactions  . Aspirin Other (See Comments)    GI upset  . Erythromycin     Gi upset  . Imdur (Isosorbide Mononitrate)     Pt does not recall reaction  . Lisinopril     cough  . Niacin And Related     Pt does not recall reactions   . Penicillins     Gi upset  . Statins     Muscle pain  . Sulfa Drugs Cross Reactors Swelling  . Tetracyclines &   Related     Current Outpatient Prescriptions  Medication Sig Dispense Refill  . acetaminophen (TYLENOL) 500 MG tablet Take 500 mg by mouth every 6 (six) hours as needed. For headache or muscle pain      . albuterol (PROVENTIL) (2.5 MG/3ML) 0.083% nebulizer solution Take 2.5 mg by nebulization every 6 (six) hours as needed. For allergies      . Azelastine HCl (ASTEPRO) 0.15 % SOLN Place 1 spray into the nose as needed. For allergies      . carbamazepine (TEGRETOL XR) 100 MG 12 hr tablet Take 500 mg by mouth 2 (two) times daily.       . ethosuximide (ZARONTIN) 250 MG capsule Take 1,500 mg by mouth daily.       . fish oil-omega-3 fatty acids 1000 MG capsule Take 1 g by mouth every other day.       . fluticasone (FLOVENT DISKUS) 50 MCG/BLIST diskus inhaler Inhale 1 puff into the lungs as needed. For  allergies      . levothyroxine (SYNTHROID, LEVOTHROID) 50 MCG tablet Take 50 mcg by mouth daily.       . naproxen sodium (ANAPROX) 220 MG tablet Take 220 mg by mouth 2 (two) times daily as needed. For body ache      . nitroGLYCERIN (NITROSTAT) 0.4 MG SL tablet Place 1 tablet (0.4 mg total) under the tongue every 5 (five) minutes x 3 doses as needed for chest pain.  25 tablet  3  . pantoprazole (PROTONIX) 40 MG tablet Take 1 tablet (40 mg total) by mouth daily.  30 tablet  6  . VIVELLE-DOT 0.075 MG/24HR Place 1 patch onto the skin 2 (two) times a week. Due for change on 08-17-11      . prasugrel (EFFIENT) 10 MG TABS Take 1 tablet (10 mg total) by mouth daily.  30 tablet  6    Review of Systems Review of Systems  Constitutional: Negative for fever, chills and unexpected weight change.  HENT: Negative for hearing loss, congestion, sore throat, trouble swallowing and voice change.   Eyes: Negative for visual disturbance.  Respiratory: Negative for cough and wheezing.   Cardiovascular: Negative for chest pain, palpitations and leg swelling.  Gastrointestinal: Negative for nausea, vomiting, abdominal pain, diarrhea, constipation, blood in stool, abdominal distention and anal bleeding.  Genitourinary: Negative for hematuria, vaginal bleeding and difficulty urinating.  Musculoskeletal: Negative for arthralgias.  Skin: Negative for rash and wound.  Neurological: Negative for seizures, syncope and headaches.  Hematological: Negative for adenopathy. Does not bruise/bleed easily.  Psychiatric/Behavioral: Negative for confusion.    Blood pressure 152/88, pulse 80, temperature 97.8 F (36.6 C), temperature source Temporal, resp. rate 16, height 5' 9" (1.753 m), weight 192 lb (87.091 kg).  Physical Exam Physical Exam  Vitals reviewed. Constitutional: She appears well-developed and well-nourished.  Eyes: No scleral icterus.  Cardiovascular: Normal rate, regular rhythm and normal heart sounds.     Pulmonary/Chest: Effort normal and breath sounds normal. She has no wheezes. She has no rales.    Abdominal: Soft. Normal appearance and bowel sounds are normal. There is no hepatomegaly. There is no tenderness. No hernia.  Lymphadenopathy:    She has no cervical adenopathy.    Data Reviewed Colonoscopy report and pictures  Assessment    Colon cancer    Plan    We discussed the staging of pathophysiology of colon cancer. We reviewed her colonoscopy. We discussed a CT scan and ruling out stage IV disease which she   already has planned tomorrow. If she had stage IV disease then the idea would be likely to begin with primary systemic therapy she really has not a lot of symptoms associated with this right now. I don't think that will be the case so we will plan on proceeding with working on scheduling surgery. I'm going to discuss this with her cardiologist prior to scheduling. I discussed a laparoscopic partial colectomy. I told her this appears to be the transverse colon but we will have to see at the time of surgery where is it we'll do an anatomic resection based on the location of the lesion as well as the blood supply of the colon. This may have been a right colectomy or left colectomy or even a transverse colectomy. She understands this. We discussed the surgery as well as the postoperative stay as well as the postoperative restrictions and time course. I told her that she would be in the hospital around 4-7 days. We discussed restrictions for 4-6 weeks and a likely 3 month or so recovery. We discussed possible reasons for adjuvant therapy for her colon cancer. She is also worried discuss with her medical oncologist as well. We discussed the risks of colon surgery including an open surgery, bleeding, blood transfusion, infection and up to 10-15% of patients with colon surgery which may include pneumonia, urinary tract infection, wound infection, intra-abdominal abscess requiring CT drainage,  anastomotic leak requiring drainage or reoperation, DVT, PE, arrhythmia, or myocardial infarction. She understands all these risks. Her husband both answered appropriate questions. I will see what her CT scan shows tomorrow. I will touch base with her cardiologist and I will proceed to schedule her as soon as possible.       Lisabeth Mian 12/06/2011, 12:00 PM    

## 2011-12-07 ENCOUNTER — Telehealth: Payer: Self-pay

## 2011-12-07 NOTE — Telephone Encounter (Signed)
Received a fax from [facility] requesting surgical clearance on patient .[facility] was called back and a message was left for [employer] Dr.Jordan responded already in epic patient to hold effient 7 days prior to surgery.

## 2011-12-08 ENCOUNTER — Ambulatory Visit (HOSPITAL_COMMUNITY)
Admission: RE | Admit: 2011-12-08 | Discharge: 2011-12-08 | Disposition: A | Discharge status: Home / Self Care | Payer: BC Managed Care – PPO | Source: Ambulatory Visit | Attending: Oncology | Admitting: Oncology

## 2011-12-08 ENCOUNTER — Telehealth (INDEPENDENT_AMBULATORY_CARE_PROVIDER_SITE_OTHER): Payer: Self-pay | Admitting: General Surgery

## 2011-12-08 DIAGNOSIS — C189 Malignant neoplasm of colon, unspecified: Secondary | ICD-10-CM | POA: Insufficient documentation

## 2011-12-08 DIAGNOSIS — Z90710 Acquired absence of both cervix and uterus: Secondary | ICD-10-CM | POA: Insufficient documentation

## 2011-12-08 DIAGNOSIS — D509 Iron deficiency anemia, unspecified: Secondary | ICD-10-CM

## 2011-12-08 MED ORDER — IOHEXOL 300 MG/ML  SOLN
100.0000 mL | Freq: Once | INTRAMUSCULAR | Status: AC | PRN
  Administered 2011-12-08: 100 mL via INTRAVENOUS

## 2011-12-08 NOTE — Telephone Encounter (Signed)
Called pt and let her know that she can stop her effient 7 days before surgery as well as to stop any ASA, fish oil, vit. E, etc.  5 days before surgery.  I let her know her CT looked fine and that our surgery schedulers should call her in the next day or so to get the surgery scheduled.

## 2011-12-08 NOTE — Telephone Encounter (Signed)
Message copied by Littie Deeds on Fri Dec 08, 2011  1:59 PM ------      Message from: [place], [place]      Created: Fri Dec 08, 2011  1:00 PM       I saw her earlier this week.  Penni Bombard I think you and I did?      Her ct is fine.  Cardiologist just said come off effient 7 days prior.        She needs to get scheduled asap.  I think I did everything already.  I may have to get rid of office or do on pm off.

## 2011-12-12 ENCOUNTER — Encounter (HOSPITAL_COMMUNITY): Payer: Self-pay | Admitting: Pharmacy Technician

## 2011-12-13 ENCOUNTER — Telehealth: Payer: Self-pay | Admitting: *Deleted

## 2011-12-13 ENCOUNTER — Encounter (HOSPITAL_COMMUNITY)
Admission: RE | Admit: 2011-12-13 | Discharge: 2011-12-13 | Disposition: A | Discharge status: Home / Self Care | Payer: BC Managed Care – PPO | Source: Ambulatory Visit | Attending: General Surgery | Admitting: General Surgery

## 2011-12-13 ENCOUNTER — Encounter (HOSPITAL_COMMUNITY): Payer: Self-pay

## 2011-12-13 HISTORY — DX: Gastric ulcer, unspecified as acute or chronic, without hemorrhage or perforation: K25.9

## 2011-12-13 HISTORY — DX: Depression, unspecified: F32.A

## 2011-12-13 HISTORY — DX: Fibromyalgia: M79.7

## 2011-12-13 HISTORY — DX: Unspecified osteoarthritis, unspecified site: M19.90

## 2011-12-13 HISTORY — DX: Major depressive disorder, single episode, unspecified: F32.9

## 2011-12-13 LAB — BASIC METABOLIC PANEL
Calcium: 8.9 mg/dL (ref 8.4–10.5)
Creatinine, Ser: 0.59 mg/dL (ref 0.50–1.10)
GFR calc non Af Amer: >90 mL/min (ref >90)
Glucose, Bld: 100 mg/dL — ABNORMAL HIGH (ref 70–99)
Sodium: 134 mEq/L — ABNORMAL LOW (ref 135–145)

## 2011-12-13 LAB — CEA: CEA: 0.9 ng/mL (ref 0.0–5.0)

## 2011-12-13 LAB — CBC
MCH: 25.7 pg — ABNORMAL LOW (ref 26.0–34.0)
MCV: 78.9 fL (ref 78.0–100.0)
Platelets: 354 10*3/uL (ref 150–400)
RBC: 4.55 MIL/uL (ref 3.87–5.11)
RDW: 22.6 % — ABNORMAL HIGH (ref 11.5–15.5)

## 2011-12-13 LAB — TYPE AND SCREEN

## 2011-12-13 NOTE — Progress Notes (Signed)
Pt states about a week ago she noticed heaviness in chest and so she took a Ntg but states she only did it as precaution,she wasn't panicking-thinks its related to anemia

## 2011-12-13 NOTE — Progress Notes (Signed)
Revonda Standard notified of pt taking Ntg within past week;orders received to repeat EKG

## 2011-12-13 NOTE — Pre-Procedure Instructions (Signed)
20 Rachel Clark  12/13/2011   Your procedure is scheduled on:  Tues, June 18 @ 7:30 AM  Report to [facility] at 5:30 AM.  Call this number if you have problems the morning of surgery: [phone]   Remember:   Do not eat food:After Midnight.     Take these medicines the morning of surgery with A SIP OF WATER: Tegretol(Carbamazepine),Duloxetine(Cymbalta),Levothyroxine(Synthroid),and Pantoprazole(Protonix)   Do not wear jewelry, make-up or nail polish.  Do not wear lotions, powders, or perfumes.   Do not shave 48 hours prior to surgery.   Do not bring valuables to the hospital.  Contacts, dentures or bridgework may not be worn into surgery.  Leave suitcase in the car. After surgery it may be brought to your room.  For patients admitted to the hospital, checkout time is 11:00 AM the day of discharge.   Patients discharged the day of surgery will not be allowed to drive home.    Special Instructions: CHG Shower Use Special Wash: 1/2 bottle night before surgery and 1/2 bottle morning of surgery.   Please read over the following fact sheets that you were given: Pain Booklet, Coughing and Deep Breathing, Blood Transfusion Information, MRSA Information and Surgical Site Infection Prevention

## 2011-12-13 NOTE — Consult Note (Signed)
Anesthesia Consult:  Patient is a 61 year old female scheduled for colon resection for cancer on 12/19/11.  History includes CAD/MI s/p CABG '07, former smoker, seizure disorder, arthritis, PUD, anemia, hypothyroidism, fibromyalgia, headaches, GERD, depression, monoclonal gammopathy of unknown significance, prior c-spine surgery, and post-operative N/V.    Her Cardiologist is Dr. Swaziland.  I was asked to speak with her due to recent use of Nitroglycerin.  She reported that she has anemia and has been receiving iron infusions.  Occasionally she feels more SOB with activity which she attributes to her anemia.  Last week her chest felt tight (not pain) just before going to bed, and she took one Nitro.  She denied associated symptoms such as nausea, diaphoresis, arm or jaw pain.  She has not had to use any Nitro since and feels "fine."   Her EKG was repeated today and showed NSR that did not appear significantly changed since her last EKG in February 2013.  Dr. Swaziland is already aware of her planned procedure, but I called and notified him of her recent Nitro use.  He did not recommend any additional testing as she did have a normal nuclear stress test with EF 59% on 08/17/11.    Her last cardiac cath on 08/14/08 showed: 1. Obstructive native vessel disease in her left anterior descending, right coronary artery, and first diagonal.  2. Patent vein graft to distal right coronary artery and left internal mammary artery to left anterior descending.  3. Preserved left ventricular systolic function, ejection fraction of  65%.   CXR on 12/13/11 was stable and showed: Cardiomediastinal silhouette is stable. Status post  median sternotomy again noted. No acute infiltrate or pleural  effusion. No pulmonary edema. Bony thorax is stable.   Labs noted.  H/H stable and slightly improved at 11.7/35.9.  T&S is already done.  CEA is in process.    Plan to proceed if no significant change in her status.  Shonna Chock,  PA-C

## 2011-12-13 NOTE — Progress Notes (Signed)
Dr.Jordan is cardiologist--last visit was a couple of months ago and was instructed by him to stop effient 7days prior to surgery   Stress test in epic from 2013 Doesn't recall ever having an echo Heart cath report in epic from 2010   [place] sees PA-Ms.Piggett  ekg and cxr in epic from 08/2011

## 2011-12-13 NOTE — Telephone Encounter (Signed)
Revonda Standard PA called, pt getting pre op done for colon resection and stated she had CP x1 relieved with use of nitro x1, PA states pt HX gerd, pt ok now, records reviewed by Dr Swaziland, Rip Harbour to proceed with surgery. Revonda Standard PA informed.

## 2011-12-14 ENCOUNTER — Other Ambulatory Visit (HOSPITAL_COMMUNITY): Payer: Self-pay | Admitting: *Deleted

## 2011-12-14 MED ORDER — PANTOPRAZOLE SODIUM 40 MG PO TBEC: 40.0000 mg | DELAYED_RELEASE_TABLET | Freq: Every morning | ORAL | Status: DC

## 2011-12-14 NOTE — Telephone Encounter (Signed)
Refilled pantoprazole.

## 2011-12-15 ENCOUNTER — Telehealth (INDEPENDENT_AMBULATORY_CARE_PROVIDER_SITE_OTHER): Payer: Self-pay | Admitting: General Surgery

## 2011-12-15 NOTE — Telephone Encounter (Signed)
Pt calling to ask about bowel prep for surgery next week.  Read and discussed the Miralax-Gatorade prep with her.  All questions answered and she will call back if needed.

## 2011-12-18 ENCOUNTER — Other Ambulatory Visit (HOSPITAL_BASED_OUTPATIENT_CLINIC_OR_DEPARTMENT_OTHER): Payer: BC Managed Care – PPO | Admitting: Lab

## 2011-12-18 DIAGNOSIS — D509 Iron deficiency anemia, unspecified: Secondary | ICD-10-CM

## 2011-12-18 LAB — CBC WITH DIFFERENTIAL/PLATELET
Basophils Absolute: 0 10*3/uL (ref 0.0–0.1)
Eosinophils Absolute: 0.3 10*3/uL (ref 0.0–0.5)
HCT: 37.3 % (ref 34.8–46.6)
HGB: 12 g/dL (ref 11.6–15.9)
LYMPH%: 25.1 % (ref 14.0–49.7)
MCV: 80.4 fL (ref 79.5–101.0)
MONO#: 0.7 10*3/uL (ref 0.1–0.9)
MONO%: 8.5 % (ref 0.0–14.0)
NEUT#: 5.3 10*3/uL (ref 1.5–6.5)
NEUT%: 62.9 % (ref 38.4–76.8)
Platelets: 348 10*3/uL (ref 145–400)
WBC: 8.5 10*3/uL (ref 3.9–10.3)

## 2011-12-18 MED ORDER — ALVIMOPAN 12 MG PO CAPS
12.0000 mg | ORAL_CAPSULE | Freq: Once | ORAL | Status: AC
  Administered 2011-12-19: 12 mg via ORAL
  Filled 2011-12-18: qty 1

## 2011-12-18 MED ORDER — SODIUM CHLORIDE 0.9 % IV SOLN
1.0000 g | INTRAVENOUS | Status: AC
  Administered 2011-12-19: 1 g via INTRAVENOUS
  Filled 2011-12-18: qty 1

## 2011-12-19 ENCOUNTER — Ambulatory Visit (HOSPITAL_COMMUNITY): Payer: BC Managed Care – PPO | Admitting: Vascular Surgery

## 2011-12-19 ENCOUNTER — Encounter (HOSPITAL_COMMUNITY): Payer: Self-pay | Admitting: Vascular Surgery

## 2011-12-19 ENCOUNTER — Encounter (HOSPITAL_COMMUNITY)
Admission: RE | Disposition: A | Discharge status: Home / Self Care | Payer: Self-pay | Source: Ambulatory Visit | Attending: General Surgery

## 2011-12-19 ENCOUNTER — Inpatient Hospital Stay (HOSPITAL_COMMUNITY)
Admission: RE | Admit: 2011-12-19 | Discharge: 2011-12-24 | DRG: 148 | Disposition: A | Discharge status: Home / Self Care | Payer: BC Managed Care – PPO | Source: Ambulatory Visit | Attending: General Surgery | Admitting: General Surgery

## 2011-12-19 DIAGNOSIS — I2510 Atherosclerotic heart disease of native coronary artery without angina pectoris: Secondary | ICD-10-CM | POA: Diagnosis present

## 2011-12-19 DIAGNOSIS — E876 Hypokalemia: Secondary | ICD-10-CM | POA: Diagnosis not present

## 2011-12-19 DIAGNOSIS — K219 Gastro-esophageal reflux disease without esophagitis: Secondary | ICD-10-CM

## 2011-12-19 DIAGNOSIS — Z9889 Other specified postprocedural states: Secondary | ICD-10-CM

## 2011-12-19 DIAGNOSIS — Z886 Allergy status to analgesic agent status: Secondary | ICD-10-CM

## 2011-12-19 DIAGNOSIS — Z882 Allergy status to sulfonamides status: Secondary | ICD-10-CM

## 2011-12-19 DIAGNOSIS — Z951 Presence of aortocoronary bypass graft: Secondary | ICD-10-CM

## 2011-12-19 DIAGNOSIS — C184 Malignant neoplasm of transverse colon: Principal | ICD-10-CM | POA: Diagnosis present

## 2011-12-19 DIAGNOSIS — D509 Iron deficiency anemia, unspecified: Secondary | ICD-10-CM | POA: Diagnosis present

## 2011-12-19 DIAGNOSIS — G40909 Epilepsy, unspecified, not intractable, without status epilepticus: Secondary | ICD-10-CM | POA: Diagnosis present

## 2011-12-19 DIAGNOSIS — Z883 Allergy status to other anti-infective agents status: Secondary | ICD-10-CM

## 2011-12-19 DIAGNOSIS — I252 Old myocardial infarction: Secondary | ICD-10-CM

## 2011-12-19 DIAGNOSIS — E785 Hyperlipidemia, unspecified: Secondary | ICD-10-CM | POA: Diagnosis present

## 2011-12-19 DIAGNOSIS — Z880 Allergy status to penicillin: Secondary | ICD-10-CM

## 2011-12-19 DIAGNOSIS — C189 Malignant neoplasm of colon, unspecified: Secondary | ICD-10-CM

## 2011-12-19 DIAGNOSIS — Z888 Allergy status to other drugs, medicaments and biological substances status: Secondary | ICD-10-CM

## 2011-12-19 DIAGNOSIS — E039 Hypothyroidism, unspecified: Secondary | ICD-10-CM | POA: Diagnosis present

## 2011-12-19 DIAGNOSIS — D649 Anemia, unspecified: Secondary | ICD-10-CM

## 2011-12-19 DIAGNOSIS — Z87891 Personal history of nicotine dependence: Secondary | ICD-10-CM

## 2011-12-19 DIAGNOSIS — Z9861 Coronary angioplasty status: Secondary | ICD-10-CM

## 2011-12-19 DIAGNOSIS — D472 Monoclonal gammopathy: Secondary | ICD-10-CM | POA: Diagnosis present

## 2011-12-19 HISTORY — PX: COLON RESECTION: SHX5231

## 2011-12-19 SURGERY — COLON RESECTION LAPAROSCOPIC
Anesthesia: General | Site: Abdomen | Wound class: Clean Contaminated

## 2011-12-19 MED ORDER — NALOXONE HCL 0.4 MG/ML IJ SOLN: 0.4000 mg | INTRAMUSCULAR | Status: DC | PRN

## 2011-12-19 MED ORDER — ONDANSETRON HCL 4 MG/2ML IJ SOLN: 4.0000 mg | Freq: Once | INTRAMUSCULAR | Status: DC | PRN

## 2011-12-19 MED ORDER — FENTANYL CITRATE 0.05 MG/ML IJ SOLN
INTRAMUSCULAR | Status: DC | PRN
  Administered 2011-12-19 (×2): 100 ug via INTRAVENOUS
  Administered 2011-12-19: 50 ug via INTRAVENOUS
  Administered 2011-12-19: 100 ug via INTRAVENOUS
  Administered 2011-12-19 (×2): 50 ug via INTRAVENOUS

## 2011-12-19 MED ORDER — ESTRADIOL 0.05 MG/24HR TD PTWK
0.0500 mg | MEDICATED_PATCH | TRANSDERMAL | Status: DC
  Administered 2011-12-19: 0.05 mg via TRANSDERMAL
  Filled 2011-12-19: qty 1

## 2011-12-19 MED ORDER — LIDOCAINE HCL (CARDIAC) 20 MG/ML IV SOLN
INTRAVENOUS | Status: DC | PRN
  Administered 2011-12-19: 100 mg via INTRAVENOUS

## 2011-12-19 MED ORDER — PROPOFOL 10 MG/ML IV EMUL
INTRAVENOUS | Status: DC | PRN
  Administered 2011-12-19: 200 mg via INTRAVENOUS

## 2011-12-19 MED ORDER — HYDROMORPHONE HCL PF 1 MG/ML IJ SOLN
0.2500 mg | INTRAMUSCULAR | Status: DC | PRN
  Administered 2011-12-19 (×4): 0.5 mg via INTRAVENOUS

## 2011-12-19 MED ORDER — LACTATED RINGERS IV SOLN
INTRAVENOUS | Status: DC | PRN
  Administered 2011-12-19 (×4): via INTRAVENOUS

## 2011-12-19 MED ORDER — EPHEDRINE SULFATE 50 MG/ML IJ SOLN
INTRAMUSCULAR | Status: DC | PRN
  Administered 2011-12-19: 5 mg via INTRAVENOUS

## 2011-12-19 MED ORDER — HEPARIN SODIUM (PORCINE) 5000 UNIT/ML IJ SOLN: 5000.0000 [IU] | Freq: Once | INTRAMUSCULAR | Status: DC

## 2011-12-19 MED ORDER — MORPHINE SULFATE (PF) 1 MG/ML IV SOLN
INTRAVENOUS | Status: AC
  Administered 2011-12-19: 25 mL via INTRAVENOUS
  Filled 2011-12-19: qty 25

## 2011-12-19 MED ORDER — ONDANSETRON HCL 4 MG/2ML IJ SOLN
INTRAMUSCULAR | Status: DC | PRN
  Administered 2011-12-19 (×2): 4 mg via INTRAVENOUS

## 2011-12-19 MED ORDER — HEPARIN SODIUM (PORCINE) 5000 UNIT/ML IJ SOLN
5000.0000 [IU] | Freq: Three times a day (TID) | INTRAMUSCULAR | Status: DC
  Administered 2011-12-20 – 2011-12-24 (×13): 5000 [IU] via SUBCUTANEOUS
  Filled 2011-12-19 (×16): qty 1

## 2011-12-19 MED ORDER — SODIUM CHLORIDE 0.9 % IV SOLN
INTRAVENOUS | Status: DC
  Administered 2011-12-19 – 2011-12-23 (×8): via INTRAVENOUS

## 2011-12-19 MED ORDER — BUPIVACAINE-EPINEPHRINE PF 0.25-1:200000 % IJ SOLN
INTRAMUSCULAR | Status: AC
  Filled 2011-12-19: qty 30

## 2011-12-19 MED ORDER — HYDROMORPHONE HCL PF 1 MG/ML IJ SOLN
INTRAMUSCULAR | Status: AC
  Filled 2011-12-19: qty 1

## 2011-12-19 MED ORDER — SODIUM CHLORIDE 0.9 % IR SOLN
Status: DC | PRN
  Administered 2011-12-19: 1

## 2011-12-19 MED ORDER — PANTOPRAZOLE SODIUM 40 MG IV SOLR
40.0000 mg | Freq: Every day | INTRAVENOUS | Status: DC
  Administered 2011-12-19 – 2011-12-22 (×4): 40 mg via INTRAVENOUS
  Filled 2011-12-19 (×5): qty 40

## 2011-12-19 MED ORDER — DEXAMETHASONE SODIUM PHOSPHATE 4 MG/ML IJ SOLN
INTRAMUSCULAR | Status: DC | PRN
  Administered 2011-12-19: 4 mg via INTRAVENOUS

## 2011-12-19 MED ORDER — SODIUM CHLORIDE 0.9 % IR SOLN
Status: DC | PRN
  Administered 2011-12-19: 2000 mL

## 2011-12-19 MED ORDER — LIDOCAINE HCL 4 % MT SOLN
OROMUCOSAL | Status: DC | PRN
  Administered 2011-12-19: 4 mL via TOPICAL

## 2011-12-19 MED ORDER — ETHOSUXIMIDE 250 MG PO CAPS
250.0000 mg | ORAL_CAPSULE | Freq: Every day | ORAL | Status: DC
  Administered 2011-12-20 – 2011-12-24 (×5): 250 mg via ORAL
  Filled 2011-12-19 (×8): qty 1

## 2011-12-19 MED ORDER — DROPERIDOL 2.5 MG/ML IJ SOLN
INTRAMUSCULAR | Status: DC | PRN
  Administered 2011-12-19: 0.625 mg via INTRAVENOUS

## 2011-12-19 MED ORDER — MIDAZOLAM HCL 5 MG/5ML IJ SOLN
INTRAMUSCULAR | Status: DC | PRN
  Administered 2011-12-19: 1 mg via INTRAVENOUS

## 2011-12-19 MED ORDER — ROCURONIUM BROMIDE 100 MG/10ML IV SOLN
INTRAVENOUS | Status: DC | PRN
  Administered 2011-12-19: 50 mg via INTRAVENOUS

## 2011-12-19 MED ORDER — MORPHINE SULFATE (PF) 1 MG/ML IV SOLN
INTRAVENOUS | Status: DC
  Administered 2011-12-19: 17.34 mg via INTRAVENOUS
  Administered 2011-12-19: 19 mg via INTRAVENOUS
  Administered 2011-12-19: 17:00:00 via INTRAVENOUS
  Administered 2011-12-20: 11 mg via INTRAVENOUS
  Administered 2011-12-20: 8 mg via INTRAVENOUS
  Administered 2011-12-20: 16 mg via INTRAVENOUS
  Administered 2011-12-20: 5 mg via INTRAVENOUS
  Administered 2011-12-20: 4 mg via INTRAVENOUS
  Administered 2011-12-20 (×2): via INTRAVENOUS
  Administered 2011-12-21: 2.66 mg via INTRAVENOUS
  Administered 2011-12-21: 07:00:00 via INTRAVENOUS
  Administered 2011-12-21: 1 mg via INTRAVENOUS
  Administered 2011-12-21: 5 mg via INTRAVENOUS
  Filled 2011-12-19 (×4): qty 25

## 2011-12-19 MED ORDER — ONDANSETRON HCL 4 MG/2ML IJ SOLN
4.0000 mg | Freq: Four times a day (QID) | INTRAMUSCULAR | Status: DC | PRN
Start: 2011-12-19 — End: 2011-12-21
  Filled 2011-12-19: qty 2

## 2011-12-19 MED ORDER — PHENYLEPHRINE HCL 10 MG/ML IJ SOLN
INTRAMUSCULAR | Status: DC | PRN
  Administered 2011-12-19 (×5): 80 ug via INTRAVENOUS

## 2011-12-19 MED ORDER — ONDANSETRON HCL 4 MG/2ML IJ SOLN
4.0000 mg | Freq: Four times a day (QID) | INTRAMUSCULAR | Status: DC | PRN
  Administered 2011-12-20: 4 mg via INTRAVENOUS

## 2011-12-19 MED ORDER — VECURONIUM BROMIDE 10 MG IV SOLR
INTRAVENOUS | Status: DC | PRN
  Administered 2011-12-19 (×3): 2 mg via INTRAVENOUS
  Administered 2011-12-19: 1 mg via INTRAVENOUS

## 2011-12-19 MED ORDER — BUPIVACAINE HCL (PF) 0.25 % IJ SOLN
INTRAMUSCULAR | Status: DC | PRN
  Administered 2011-12-19: 10 mL

## 2011-12-19 MED ORDER — ACETAMINOPHEN 325 MG PO TABS
650.0000 mg | ORAL_TABLET | Freq: Four times a day (QID) | ORAL | Status: DC | PRN
  Administered 2011-12-21 – 2011-12-23 (×2): 650 mg via ORAL
  Filled 2011-12-19 (×2): qty 2

## 2011-12-19 MED ORDER — HYDROMORPHONE HCL PF 1 MG/ML IJ SOLN
INTRAMUSCULAR | Status: AC
  Filled 2011-12-19: qty 2

## 2011-12-19 MED ORDER — NEOSTIGMINE METHYLSULFATE 1 MG/ML IJ SOLN
INTRAMUSCULAR | Status: DC | PRN
  Administered 2011-12-19: 5 mg via INTRAVENOUS

## 2011-12-19 MED ORDER — NITROGLYCERIN 0.4 MG SL SUBL: 0.4000 mg | SUBLINGUAL_TABLET | SUBLINGUAL | Status: DC | PRN

## 2011-12-19 MED ORDER — ACETAMINOPHEN 650 MG RE SUPP: 650.0000 mg | Freq: Four times a day (QID) | RECTAL | Status: DC | PRN

## 2011-12-19 MED ORDER — DIPHENHYDRAMINE HCL 50 MG/ML IJ SOLN: 12.5000 mg | Freq: Four times a day (QID) | INTRAMUSCULAR | Status: DC | PRN

## 2011-12-19 MED ORDER — ETHOSUXIMIDE 250 MG PO CAPS
500.0000 mg | ORAL_CAPSULE | ORAL | Status: DC
  Administered 2011-12-19 – 2011-12-23 (×10): 500 mg via ORAL
  Filled 2011-12-19 (×12): qty 2

## 2011-12-19 MED ORDER — CARBAMAZEPINE ER 400 MG PO TB12
500.0000 mg | ORAL_TABLET | Freq: Two times a day (BID) | ORAL | Status: DC
  Administered 2011-12-19 – 2011-12-24 (×10): 500 mg via ORAL
  Filled 2011-12-19 (×12): qty 1

## 2011-12-19 MED ORDER — SODIUM CHLORIDE 0.9 % IJ SOLN: 9.0000 mL | INTRAMUSCULAR | Status: DC | PRN

## 2011-12-19 MED ORDER — ALVIMOPAN 12 MG PO CAPS: 12.0000 mg | ORAL_CAPSULE | Freq: Once | ORAL | Status: DC

## 2011-12-19 MED ORDER — DIPHENHYDRAMINE HCL 12.5 MG/5ML PO ELIX
12.5000 mg | ORAL_SOLUTION | Freq: Four times a day (QID) | ORAL | Status: DC | PRN
  Filled 2011-12-19: qty 5

## 2011-12-19 MED ORDER — GLYCOPYRROLATE 0.2 MG/ML IJ SOLN
INTRAMUSCULAR | Status: DC | PRN
  Administered 2011-12-19: .8 mg via INTRAVENOUS

## 2011-12-19 SURGICAL SUPPLY — 80 items
ADH SKN CLS APL DERMABOND .7 (GAUZE/BANDAGES/DRESSINGS) ×1
APPLIER CLIP 5 13 M/L LIGAMAX5 (MISCELLANEOUS)
APR CLP MED LRG 5 ANG JAW (MISCELLANEOUS)
BLADE SURG 10 STRL SS (BLADE) ×2 IMPLANT
BLADE SURG ROTATE 9660 (MISCELLANEOUS) IMPLANT
CANISTER SUCTION 2500CC (MISCELLANEOUS) ×2 IMPLANT
CELLS DAT CNTRL 66122 CELL SVR (MISCELLANEOUS) IMPLANT
CHLORAPREP W/TINT 26ML (MISCELLANEOUS) ×2 IMPLANT
CLIP APPLIE 5 13 M/L LIGAMAX5 (MISCELLANEOUS) IMPLANT
CLOTH BEACON ORANGE TIMEOUT ST (SAFETY) ×2 IMPLANT
COVER SURGICAL LIGHT HANDLE (MISCELLANEOUS) ×2 IMPLANT
DERMABOND ADVANCED (GAUZE/BANDAGES/DRESSINGS) ×1
DERMABOND ADVANCED .7 DNX12 (GAUZE/BANDAGES/DRESSINGS) ×1 IMPLANT
DRAPE PROXIMA HALF (DRAPES) ×1 IMPLANT
DRAPE WARM FLUID 44X44 (DRAPE) ×2 IMPLANT
DRSG COVADERM 4X6 (GAUZE/BANDAGES/DRESSINGS) ×1 IMPLANT
ELECT BLADE 6.5 EXT (BLADE) IMPLANT
ELECT CAUTERY BLADE 6.4 (BLADE) ×3 IMPLANT
ELECT REM PT RETURN 9FT ADLT (ELECTROSURGICAL) ×2
ELECTRODE REM PT RTRN 9FT ADLT (ELECTROSURGICAL) ×1 IMPLANT
GEL ULTRASOUND 20GR AQUASONIC (MISCELLANEOUS) IMPLANT
GLOVE BIO SURGEON STRL SZ7 (GLOVE) ×4 IMPLANT
GLOVE BIO SURGEON STRL SZ7.5 (GLOVE) ×1 IMPLANT
GLOVE BIOGEL PI IND STRL 7.0 (GLOVE) IMPLANT
GLOVE BIOGEL PI IND STRL 7.5 (GLOVE) ×1 IMPLANT
GLOVE BIOGEL PI INDICATOR 7.0 (GLOVE) ×1
GLOVE BIOGEL PI INDICATOR 7.5 (GLOVE) ×2
GLOVE SURG ORTHO 8.0 STRL STRW (GLOVE) ×2 IMPLANT
GLOVE SURG SS PI 7.0 STRL IVOR (GLOVE) ×1 IMPLANT
GOWN STRL NON-REIN LRG LVL3 (GOWN DISPOSABLE) ×7 IMPLANT
GOWN STRL REIN XL XLG (GOWN DISPOSABLE) ×1 IMPLANT
KIT BASIN OR (CUSTOM PROCEDURE TRAY) ×2 IMPLANT
KIT ROOM TURNOVER OR (KITS) ×2 IMPLANT
LEGGING LITHOTOMY PAIR STRL (DRAPES) IMPLANT
LIGASURE IMPACT 36 18CM CVD LR (INSTRUMENTS) IMPLANT
NS IRRIG 1000ML POUR BTL (IV SOLUTION) ×2 IMPLANT
PAD ARMBOARD 7.5X6 YLW CONV (MISCELLANEOUS) ×4 IMPLANT
PENCIL BUTTON HOLSTER BLD 10FT (ELECTRODE) ×2 IMPLANT
RELOAD PROXIMATE 75MM BLUE (ENDOMECHANICALS) ×4 IMPLANT
RELOAD STAPLE 75 3.8 BLU REG (ENDOMECHANICALS) IMPLANT
RETRACTOR WND ALEXIS 18 MED (MISCELLANEOUS) ×1 IMPLANT
RTRCTR WOUND ALEXIS 18CM MED (MISCELLANEOUS)
SCALPEL HARMONIC ACE (MISCELLANEOUS) ×1 IMPLANT
SCISSORS LAP 5X35 DISP (ENDOMECHANICALS) ×1 IMPLANT
SET IRRIG TUBING LAPAROSCOPIC (IRRIGATION / IRRIGATOR) ×2 IMPLANT
SLEEVE ENDOPATH XCEL 5M (ENDOMECHANICALS) ×4 IMPLANT
SPECIMEN JAR LARGE (MISCELLANEOUS) ×2 IMPLANT
SPONGE GAUZE 4X4 12PLY (GAUZE/BANDAGES/DRESSINGS) ×1 IMPLANT
SPONGE LAP 18X18 X RAY DECT (DISPOSABLE) ×1 IMPLANT
STAPLER GUN LINEAR PROX 60 (STAPLE) ×1 IMPLANT
STAPLER PROXIMATE 75MM BLUE (STAPLE) ×1 IMPLANT
STAPLER VISISTAT 35W (STAPLE) ×3 IMPLANT
SUCTION POOLE TIP (SUCTIONS) IMPLANT
SURGILUBE 2OZ TUBE FLIPTOP (MISCELLANEOUS) IMPLANT
SUT PDS AB 1 CTX 36 (SUTURE) ×2 IMPLANT
SUT PDS AB 1 TP1 96 (SUTURE) IMPLANT
SUT PROLENE 2 0 CT2 30 (SUTURE) IMPLANT
SUT PROLENE 2 0 KS (SUTURE) IMPLANT
SUT SILK 0 TIES 10X30 (SUTURE) IMPLANT
SUT SILK 2 0 (SUTURE) ×2
SUT SILK 2 0 SH CR/8 (SUTURE) ×2 IMPLANT
SUT SILK 2-0 18XBRD TIE 12 (SUTURE) IMPLANT
SUT SILK 3 0 (SUTURE) ×2
SUT SILK 3 0 SH CR/8 (SUTURE) ×2 IMPLANT
SUT SILK 3-0 18XBRD TIE 12 (SUTURE) IMPLANT
SUT VIC AB 3-0 SH 8-18 (SUTURE) IMPLANT
SYS LAPSCP GELPORT 120MM (MISCELLANEOUS) ×2
SYSTEM LAPSCP GELPORT 120MM (MISCELLANEOUS) IMPLANT
TOWEL OR 17X24 6PK STRL BLUE (TOWEL DISPOSABLE) ×2 IMPLANT
TOWEL OR 17X26 10 PK STRL BLUE (TOWEL DISPOSABLE) ×3 IMPLANT
TRAY FOLEY CATH 14FRSI W/METER (CATHETERS) ×2 IMPLANT
TRAY LAPAROSCOPIC (CUSTOM PROCEDURE TRAY) ×2 IMPLANT
TRAY PROCTOSCOPIC FIBER OPTIC (SET/KITS/TRAYS/PACK) IMPLANT
TROCAR XCEL BLUNT TIP 100MML (ENDOMECHANICALS) ×1 IMPLANT
TROCAR XCEL NON-BLD 11X100MML (ENDOMECHANICALS) IMPLANT
TROCAR XCEL NON-BLD 5MMX100MML (ENDOMECHANICALS) ×2 IMPLANT
TUBE CONNECTING 12X1/4 (SUCTIONS) ×2 IMPLANT
TUBING FILTER THERMOFLATOR (ELECTROSURGICAL) ×2 IMPLANT
WATER STERILE IRR 1000ML POUR (IV SOLUTION) ×1 IMPLANT
YANKAUER SUCT BULB TIP NO VENT (SUCTIONS) ×2 IMPLANT

## 2011-12-19 NOTE — Anesthesia Preprocedure Evaluation (Addendum)
Anesthesia Evaluation  Patient identified by MRN, date of birth, ID band Patient awake    History of Anesthesia Complications (+) PONV  Airway Mallampati: III TM Distance: <3 FB     Dental  (+) Teeth Intact and Dental Advisory Given   Pulmonary shortness of breath and with exertion,          Cardiovascular + CAD, + Past MI and + CABG     Neuro/Psych  Headaches, Seizures -, Well Controlled,   Neuromuscular disease    GI/Hepatic PUD, GERD-  Medicated and Controlled,  Endo/Other  Hypothyroidism   Renal/GU      Musculoskeletal  (+) Fibromyalgia -  Abdominal   Peds  Hematology   Anesthesia Other Findings   Reproductive/Obstetrics                         Anesthesia Physical Anesthesia Plan  ASA: II  Anesthesia Plan: General   Post-op Pain Management:    Induction: Intravenous  Airway Management Planned: Oral ETT  Additional Equipment:   Intra-op Plan:   Post-operative Plan: Extubation in OR  Informed Consent: I have reviewed the patients History and Physical, chart, labs and discussed the procedure including the risks, benefits and alternatives for the proposed anesthesia with the patient or authorized representative who has indicated his/her understanding and acceptance.     Plan Discussed with: CRNA, Anesthesiologist and Surgeon  Anesthesia Plan Comments:         Anesthesia Quick Evaluation

## 2011-12-19 NOTE — Progress Notes (Signed)
Pt arrived to room 5154 from PACU via stretcher.  Pt needed assist getting over to the bed from stretcher.  VSS.  Pt is drowsy but easily aroused.  Pt's abd is unremarkable with 4 lap sites and 1 clean, dry dressing.  Oriented pt and family to the room, call bell and dept [phone].  Instructed pt and family about PCA and they verbalized understanding.  Will cont to monitor. Sol Blazing [facility] 12/19/2011

## 2011-12-19 NOTE — Anesthesia Postprocedure Evaluation (Signed)
  Anesthesia Post-op Note  Patient: Rachel Clark  Procedure(s) Performed: Procedure(s) (LRB): COLON RESECTION LAPAROSCOPIC (N/A)  Patient Location: PACU  Anesthesia Type: General  Level of Consciousness: awake, oriented, sedated and patient cooperative  Airway and Oxygen Therapy: Patient Spontanous Breathing and Patient connected to nasal cannula oxygen  Post-op Pain: mild  Post-op Assessment: Post-op Vital signs reviewed, Patient's Cardiovascular Status Stable, Respiratory Function Stable, Patent Airway, No signs of Nausea or vomiting and Pain level controlled  Post-op Vital Signs: stable  Complications: No apparent anesthesia complications

## 2011-12-19 NOTE — Anesthesia Procedure Notes (Signed)
Procedure Name: Intubation Date/Time: 12/19/2011 7:32 AM Performed by: Sherie Don Pre-anesthesia Checklist: Patient identified, Emergency Drugs available, Suction available, Patient being monitored and Timeout performed Patient Re-evaluated:Patient Re-evaluated prior to inductionOxygen Delivery Method: Circle system utilized Preoxygenation: Pre-oxygenation with 100% oxygen Intubation Type: IV induction Ventilation: Mask ventilation without difficulty Laryngoscope Size: Mac and 3 Grade View: Grade I Tube type: Oral Tube size: 7.0 mm Airway Equipment and Method: Stylet and LTA kit utilized Placement Confirmation: ETT inserted through vocal cords under direct vision,  CO2 detector and breath sounds checked- equal and bilateral Secured at: 20 cm Tube secured with: Tape Dental Injury: Teeth and Oropharynx as per pre-operative assessment

## 2011-12-19 NOTE — Interval H&P Note (Signed)
History and Physical Interval Note:  12/19/2011 7:10 AM  Rachel Clark  has presented today for surgery, with the diagnosis of remove colon cancer  The various methods of treatment have been discussed with the patient and family. After consideration of risks, benefits and other options for treatment, the patient has consented to  Procedure(s) (LRB): COLON RESECTION LAPAROSCOPIC (N/A) as a surgical intervention .  The patient's history has been reviewed, patient examined, no change in status, stable for surgery.  I have reviewed the patients' chart and labs.  Questions were answered to the patient's satisfaction.     Almira Phetteplace

## 2011-12-19 NOTE — Transfer of Care (Signed)
Immediate Anesthesia Transfer of Care Note  Patient: Rachel Clark  Procedure(s) Performed: Procedure(s) (LRB): COLON RESECTION LAPAROSCOPIC (N/A)  Patient Location: PACU  Anesthesia Type: General  Level of Consciousness: sedated and patient cooperative  Airway & Oxygen Therapy: Patient Spontanous Breathing and Patient connected to face mask oxygen  Post-op Assessment: Report given to PACU RN and Post -op Vital signs reviewed and stable  Post vital signs: Reviewed and stable  Complications: No apparent anesthesia complications

## 2011-12-19 NOTE — Preoperative (Signed)
Beta Blockers   Reason not to administer Beta Blockers:Not Applicable 

## 2011-12-19 NOTE — Progress Notes (Signed)
FAMILY INSTRUCTED THAT PT IS THE ONLY PERSON TO PUSH PCA BUTTON.  SAW HUSBAND PUSHING PCA BUTTON.

## 2011-12-19 NOTE — H&P (View-Only) (Signed)
Patient ID: Rachel Clark, female   DOB: [birth year], 61 y.o.   MRN: [id number]  Chief Complaint  Patient presents with  . GI Problem    new pt- eval colon mass    HPI Rachel Clark is a 61 y.o. female.  Referred by Dr. Bosie Clos HPI This is a 61 year old female with a history of monoclonal gammopathy of unknown significance that is followed by Dr. Gaylyn Rong. She has had iron deficiency anemia and has had some occasional dizziness or lightheadedness. She reports no changes in her bowel movements, any black stools, hematochezia, or any loss of her appetite. She has lost about 30 pounds over the last 6 months but this has been intentional. She was referred for colonoscopy which she is now undergone which shows a frond-like infiltrative nonobstructing large mass in the transverse colon. This is partially circumferential about half of the lumen circumference. There was no bleeding present at the time. This was tattooed with [place] ink. 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 The remainder of her colonoscopy was without any other lesions. She has recently also seen her medical oncologist was ordered a CT scan and she is undergoing tomorrow and presents today to discuss surgery.  Past Medical History  Diagnosis Date  . Coronary artery disease     Has had remote NSTEMI with PCI to the RCA; S/P CABG in 2007; LAST CATH WITH MODERATE DISEASE IN A VERY SMALL DIAGONAL BRANCH; SHE IS MANAGED MEDICALLY  . Dyslipidemia   . Seizure disorder   . GERD (gastroesophageal reflux disease)   . History of fibromyalgia   . Chest pain   . Monoclonal gammopathy of unknown significance   . Hypothyroid   . Aspirin intolerance   . Statin intolerance   . Anemia 09/18/2011  . Iron deficiency anemia 11/17/2011  . Colon cancer 12/04/2011  . Hyperlipidemia   . Myocardial infarction 2007  . Seizures 12/06/11    last seizure over 40 yrs ago    Past Surgical History  Procedure Date  . Cardiac catheterization  05/20/2009    obstructive native vessel disease in LAD, RCA, and first diagonal, patent vein graft to distal RCA and LIMA to LAD,normal. ef 60%  . Coronary artery bypass graft     SAPHENOUS VEIN GRAFT TO THE DISTAL RIGHT CORONARY IN AN LIMA GRAFT TO THE LAD.  Marland Kitchen Partial hysterectomy   . Posterior laminectomy / decompression cervical spine     Family History  Problem Relation Age of Onset  . Heart attack Father   . Heart disease Father   . Heart attack Brother 40  . Cancer Mother     lung    Social History History  Substance Use Topics  . Smoking status: Former Smoker -- 0.3 packs/day for 20 years    Types: Cigarettes    Quit date: 12/23/1990  . Smokeless tobacco: Never Used  . Alcohol Use: Yes     occ wine    Allergies  Allergen Reactions  . Aspirin Other (See Comments)    GI upset  . Erythromycin     Gi upset  . Imdur (Isosorbide Mononitrate)     Pt does not recall reaction  . Lisinopril     cough  . Niacin And Related     Pt does not recall reactions   . Penicillins     Gi upset  . Statins     Muscle pain  . Sulfa Drugs Cross Reactors Swelling  . Tetracyclines &  Related     Current Outpatient Prescriptions  Medication Sig Dispense Refill  . acetaminophen (TYLENOL) 500 MG tablet Take 500 mg by mouth every 6 (six) hours as needed. For headache or muscle pain      . albuterol (PROVENTIL) (2.5 MG/3ML) 0.083% nebulizer solution Take 2.5 mg by nebulization every 6 (six) hours as needed. For allergies      . Azelastine HCl (ASTEPRO) 0.15 % SOLN Place 1 spray into the nose as needed. For allergies      . carbamazepine (TEGRETOL XR) 100 MG 12 hr tablet Take 500 mg by mouth 2 (two) times daily.       Marland Kitchen ethosuximide (ZARONTIN) 250 MG capsule Take 1,500 mg by mouth daily.       . fish oil-omega-3 fatty acids 1000 MG capsule Take 1 g by mouth every other day.       . fluticasone (FLOVENT DISKUS) 50 MCG/BLIST diskus inhaler Inhale 1 puff into the lungs as needed. For  allergies      . levothyroxine (SYNTHROID, LEVOTHROID) 50 MCG tablet Take 50 mcg by mouth daily.       . naproxen sodium (ANAPROX) 220 MG tablet Take 220 mg by mouth 2 (two) times daily as needed. For body ache      . nitroGLYCERIN (NITROSTAT) 0.4 MG SL tablet Place 1 tablet (0.4 mg total) under the tongue every 5 (five) minutes x 3 doses as needed for chest pain.  25 tablet  3  . pantoprazole (PROTONIX) 40 MG tablet Take 1 tablet (40 mg total) by mouth daily.  30 tablet  6  . VIVELLE-DOT 0.075 MG/24HR Place 1 patch onto the skin 2 (two) times a week. Due for change on 08-17-11      . prasugrel (EFFIENT) 10 MG TABS Take 1 tablet (10 mg total) by mouth daily.  30 tablet  6    Review of Systems Review of Systems  Constitutional: Negative for fever, chills and unexpected weight change.  HENT: Negative for hearing loss, congestion, sore throat, trouble swallowing and voice change.   Eyes: Negative for visual disturbance.  Respiratory: Negative for cough and wheezing.   Cardiovascular: Negative for chest pain, palpitations and leg swelling.  Gastrointestinal: Negative for nausea, vomiting, abdominal pain, diarrhea, constipation, blood in stool, abdominal distention and anal bleeding.  Genitourinary: Negative for hematuria, vaginal bleeding and difficulty urinating.  Musculoskeletal: Negative for arthralgias.  Skin: Negative for rash and wound.  Neurological: Negative for seizures, syncope and headaches.  Hematological: Negative for adenopathy. Does not bruise/bleed easily.  Psychiatric/Behavioral: Negative for confusion.    Blood pressure 152/88, pulse 80, temperature 97.8 F (36.6 C), temperature source Temporal, resp. rate 16, height 5\' 9"  (1.753 m), weight 192 lb (87.091 kg).  Physical Exam Physical Exam  Vitals reviewed. Constitutional: She appears well-developed and well-nourished.  Eyes: No scleral icterus.  Cardiovascular: Normal rate, regular rhythm and normal heart sounds.     Pulmonary/Chest: Effort normal and breath sounds normal. She has no wheezes. She has no rales.    Abdominal: Soft. Normal appearance and bowel sounds are normal. There is no hepatomegaly. There is no tenderness. No hernia.  Lymphadenopathy:    She has no cervical adenopathy.    Data Reviewed Colonoscopy report and pictures  Assessment    Colon cancer    Plan    We discussed the staging of pathophysiology of colon cancer. We reviewed her colonoscopy. We discussed a CT scan and ruling out stage IV disease which she  already has planned tomorrow. If she had stage IV disease then the idea would be likely to begin with primary systemic therapy she really has not a lot of symptoms associated with this right now. I don't think that will be the case so we will plan on proceeding with working on scheduling surgery. I'm going to discuss this with her cardiologist prior to scheduling. I discussed a laparoscopic partial colectomy. I told her this appears to be the transverse colon but we will have to see at the time of surgery where is it we'll do an anatomic resection based on the location of the lesion as well as the blood supply of the colon. This may have been a right colectomy or left colectomy or even a transverse colectomy. She understands this. We discussed the surgery as well as the postoperative stay as well as the postoperative restrictions and time course. I told her that she would be in the hospital around 4-7 days. We discussed restrictions for 4-6 weeks and a likely 3 month or so recovery. We discussed possible reasons for adjuvant therapy for her colon cancer. She is also worried discuss with her medical oncologist as well. We discussed the risks of colon surgery including an open surgery, bleeding, blood transfusion, infection and up to 10-15% of patients with colon surgery which may include pneumonia, urinary tract infection, wound infection, intra-abdominal abscess requiring CT drainage,  anastomotic leak requiring drainage or reoperation, DVT, PE, arrhythmia, or myocardial infarction. She understands all these risks. Her husband both answered appropriate questions. I will see what her CT scan shows tomorrow. I will touch base with her cardiologist and I will proceed to schedule her as soon as possible.       Zamire Whitehurst 12/06/2011, 12:00 PM

## 2011-12-19 NOTE — Op Note (Signed)
Preoperative diagnosis: Transverse colon adenocarcinoma Postoperative diagnosis: Same as above Procedure: Laparoscopic-assisted transverse colectomy Surgeon: Dr. Harden Mo Asst.: Dr. Darnell Level Anesthesia: Gen. Specimens: transverse colon marked stitch proximal Estimated blood loss: 50 cc Consultations: None Drains: None Sponge and needle count correct x2 at end of operation Disposition to recovery in stable condition  Indications: This is a 60 year old female with multiple medical problems who was noted to have an iron deficiency anemia. She underwent a colonoscopy showing a transverse colon mass that was biopsied and showed adenocarcinoma. This was marked with ink. She had a CT scan that showed no evidence of stage IV disease. I then evaluated her and we discussed a laparoscopic partial colectomy and the risks and benefits associated with that.  Procedure: After informed consent was obtained the patient underwent a  bowel preparation at home. Sequential compression devices were placed on her legs. 5000 units of subcutaneous heparin were administered. Entereg was administered preoperatively as well. She was administered 1 g of intravenous or ertapenem. She was then placed under general anesthesia without complication. An orogastric tube and a Foley catheter were placed. She was then prepped and draped in the standard sterile surgical fashion. A surgical timeout was then performed.  I made an incision her left upper quadrant and in her abdomen using the 5 mm Optiview trocar. This was done without any injury. The abdomen was then insufflated to 15 mmHg pressure. I then placed 3 further 5 mm ports just below the umbilicus as well as in the bilateral lower quadrants under direct vision after infiltration with local anesthetic without constipation. I then was able to see her tattoo after I elevated her omentum. The tattoo was in the center of the transverse colon. I then proceeded using Harmonic  scalpel to dissect the omentum away from this in its entirety. Once I had done this I mobilized the hepatic flexure as well. There was a lot of length and I decided this point a transverse colectomy would be a reasonable surgery. I then went towards the splenic flexure and mobilized the colon and took down a portion of the splenic flexure to get some more mobility as well. I then inserted a hand port. The colon all came out without any tension. I was able to identify the mass as well. I elected at this point to do a transverse colectomy as I think this anastomosis would be without any tension. There was no oncologic need to remove any more than this based on the middle colic pedicle.I then used a GIA to divide the colon 10 cm in either direction. This was then passed off the table as a specimen. I marked this with a stitch proximal. I then brought both ends together. I brought the taenea together with 3-0 silk sutures. I then made an enterotomy in either limb and used the GIA stapler to create an anastomosis. This was hemostatic and patent. I then used a TA stapler to close the common enterotomy. I then placed an additional 3-0 silk crotch stitch. There was no real mesenteric defect to close at this point. This was patent without any tension and viable. I then placed this back in the abdomen and placed the omentum overlying it. The remainder of the abdomen was without any fluid. I then removed my hand port and I closed this wound with #1 PDS. I then put the laparoscope back in and everything looked well. I then removed the trocars. I then closed the incision with staples and the remainder  with Dermabond and 4-0 Monocryl. A dressing was placed over the extraction port. She tolerated this well was extubated and transferred to recovery stable.

## 2011-12-20 ENCOUNTER — Encounter (HOSPITAL_COMMUNITY): Payer: Self-pay | Admitting: General Surgery

## 2011-12-20 LAB — BASIC METABOLIC PANEL
BUN: 10 mg/dL (ref 6–23)
Calcium: 8.4 mg/dL (ref 8.4–10.5)
GFR calc non Af Amer: >90 mL/min (ref >90)
Glucose, Bld: 107 mg/dL — ABNORMAL HIGH (ref 70–99)

## 2011-12-20 LAB — CBC
MCH: 25.9 pg — ABNORMAL LOW (ref 26.0–34.0)
MCHC: 31.9 g/dL (ref 30.0–36.0)
Platelets: 282 10*3/uL (ref 150–400)

## 2011-12-20 MED ORDER — ALVIMOPAN 12 MG PO CAPS
12.0000 mg | ORAL_CAPSULE | Freq: Two times a day (BID) | ORAL | Status: DC
  Administered 2011-12-20 – 2011-12-24 (×9): 12 mg via ORAL
  Filled 2011-12-20 (×10): qty 1

## 2011-12-20 MED ORDER — SODIUM CHLORIDE 0.9 % IV BOLUS (SEPSIS): 500.0000 mL | Freq: Once | INTRAVENOUS | Status: DC

## 2011-12-20 MED ORDER — SODIUM CHLORIDE 0.9 % IV BOLUS (SEPSIS)
500.0000 mL | Freq: Once | INTRAVENOUS | Status: AC
  Administered 2011-12-20: 500 mL via INTRAVENOUS

## 2011-12-20 NOTE — Progress Notes (Signed)
1 Day Post-Op  Subjective: No n/v, bolus overnight for low uop, no flatus, pain controlled  Objective: Vital signs in last 24 hours: Temp:  [97.4 F (36.3 C)-98.1 F (36.7 C)] 98.1 F (36.7 C) (06/19 0617) Pulse Rate:  [65-94] 78  (06/19 0617) Resp:  [11-29] 19  (06/19 0617) BP: (107-157)/(44-99) 157/79 mmHg (06/19 0617) SpO2:  [95 %-100 %] 97 % (06/19 0617) Last BM Date: 12/18/11  Intake/Output from previous day: 06/18 0701 - 06/19 0700 In: 4939 [I.V.:4439; IV Piggyback:500] Out: 935 [Urine:935] Intake/Output this shift:    General appearance: no distress Resp: clear to auscultation bilaterally Cardio: regular rate and rhythm GI: dressing dry, very few bs appropr tender  Lab Results:   Basename 12/20/11 0700 12/18/11 1346  WBC 6.9 8.5  HGB 10.3* 12.0  HCT 32.3* 37.3  PLT 282 348    Assessment/Plan: POD 1 transverse colectomy for adenoca 1. Continue pca, entereg 2. pulm toilet, oob today 3. Continue npo today 4. Bolus today, monitor uop, if uop is better during day will remove foley catheter, will leave in for now due to uop 5. hct ok 6. Scds, sq heparin 7. Await path   LOS: 1 day    [facility] 12/20/2011

## 2011-12-21 ENCOUNTER — Other Ambulatory Visit: Payer: Self-pay

## 2011-12-21 ENCOUNTER — Encounter (HOSPITAL_COMMUNITY): Payer: Self-pay | Admitting: General Practice

## 2011-12-21 MED ORDER — DIPHENHYDRAMINE HCL 12.5 MG/5ML PO ELIX: 12.5000 mg | ORAL_SOLUTION | Freq: Four times a day (QID) | ORAL | Status: DC | PRN

## 2011-12-21 MED ORDER — SODIUM CHLORIDE 0.9 % IJ SOLN: 9.0000 mL | INTRAMUSCULAR | Status: DC | PRN

## 2011-12-21 MED ORDER — NALOXONE HCL 0.4 MG/ML IJ SOLN: 0.4000 mg | INTRAMUSCULAR | Status: DC | PRN

## 2011-12-21 MED ORDER — MORPHINE SULFATE (PF) 1 MG/ML IV SOLN: INTRAVENOUS | Status: DC

## 2011-12-21 MED ORDER — DIPHENHYDRAMINE HCL 50 MG/ML IJ SOLN: 12.5000 mg | Freq: Four times a day (QID) | INTRAMUSCULAR | Status: DC | PRN

## 2011-12-21 MED ORDER — ONDANSETRON HCL 4 MG/2ML IJ SOLN: 4.0000 mg | Freq: Four times a day (QID) | INTRAMUSCULAR | Status: DC | PRN

## 2011-12-21 MED ORDER — DIPHENHYDRAMINE HCL 12.5 MG/5ML PO ELIX
12.5000 mg | ORAL_SOLUTION | Freq: Four times a day (QID) | ORAL | Status: DC | PRN
  Filled 2011-12-21: qty 5

## 2011-12-21 MED ORDER — DIPHENHYDRAMINE HCL 50 MG/ML IJ SOLN
12.5000 mg | Freq: Three times a day (TID) | INTRAMUSCULAR | Status: DC | PRN
  Administered 2011-12-21 – 2011-12-23 (×3): 12.5 mg via INTRAVENOUS
  Filled 2011-12-21 (×3): qty 1

## 2011-12-21 MED ORDER — MORPHINE SULFATE 2 MG/ML IJ SOLN: 1.0000 mg | INTRAMUSCULAR | Status: DC | PRN

## 2011-12-21 NOTE — Progress Notes (Signed)
2 Days Post-Op  Subjective: Small amount flatus, up and around, pain controlled, voiding fine  Objective: Vital signs in last 24 hours: Temp:  [97.5 F (36.4 C)-98.8 F (37.1 C)] 98.5 F (36.9 C) (06/20 0555) Pulse Rate:  [80-105] 96  (06/20 0555) Resp:  [16-20] 18  (06/20 0555) BP: (117-168)/(51-82) 127/70 mmHg (06/20 0555) SpO2:  [92 %-99 %] 98 % (06/20 0555) Weight:  [192 lb 10.9 oz (87.4 kg)] 192 lb 10.9 oz (87.4 kg) (06/19 1320) Last BM Date: 12/18/11  Intake/Output from previous day: 06/19 0701 - 06/20 0700 In: 2785.4 [I.V.:2785.4] Out: 3275 [Urine:3250; Emesis/NG output:25] Intake/Output this shift:    General appearance: no distress Resp: clear to auscultation bilaterally Cardio: regular rate and rhythm GI: abnormal findings:  soft approp tender, wounds clean  Lab Results:   Basename 12/20/11 0700 12/18/11 1346  WBC 6.9 8.5  HGB 10.3* 12.0  HCT 32.3* 37.3  PLT 282 348   BMET  Basename 12/20/11 0700  NA 135  K 4.1  CL 101  CO2 27  GLUCOSE 107*  BUN 10  CREATININE 0.62  CALCIUM 8.4    Assessment/Plan: POD 2 lap transverse colectomy 1. Continue pca reduced dose, continue entereg 2. pulm toilet 3. Will give clears today 4. Decrease ivf 5. Path discussed, will let dr ha know 6. Check labs tomorrow 7. scds sq heparin  LOS: 2 days    [facility] 12/21/2011

## 2011-12-21 NOTE — Progress Notes (Signed)
Observed pt's husband pushing PCA button.  Pt and her husband again informed that the patient is the only person who should push the button.  Both said they knew that

## 2011-12-21 NOTE — Discharge Instructions (Signed)
[facility]      [facility], [place] [phone]   ABDOMINAL SURGERY: POST OP INSTRUCTIONS  Always review your discharge instruction sheet given to you by the facility where your surgery was performed.  IF YOU HAVE DISABILITY OR FAMILY LEAVE FORMS, YOU MUST BRING THEM TO THE OFFICE FOR PROCESSING.  PLEASE DO NOT GIVE THEM TO YOUR DOCTOR.  1. A prescription for pain medication may be given to you upon discharge.  Take your pain medication as prescribed, if needed.  If narcotic pain medicine is not needed, then you may take acetaminophen (Tylenol) or ibuprofen (Advil) as needed. 2. Take your usually prescribed medications unless otherwise directed. 3. If you need a refill on your pain medication, please contact your pharmacy. They will contact our office to request authorization.  Prescriptions will not be filled after 5pm or on week-ends. 4. You should follow a light diet the first few days after arrival home, such as soup and crackers, pudding, etc.unless your doctor has advised otherwise. A high-fiber, low fat diet can be resumed as tolerated.   Be sure to include lots of fluids daily. Most patients will experience some swelling and bruising around the incisions.  Ice packs will help.  Swelling and bruising can take several days to resolve 5. It is common to experience some constipation if taking pain medication after surgery.  Increasing fluid intake and taking a stool softener will usually help or prevent this problem from occurring.  A mild laxative (Milk of Magnesia or Miralax) should be taken according to package directions if there are no bowel movements after 48 hours. 6.  You may have steri-strips (small skin tapes) in place directly over the incision.  These strips should be left on the skin for 7-10 days.  If your surgeon used skin glue on the incision, you may shower in 24 hours.  The glue will flake off over the next 2-3 weeks.  Any sutures or staples will be removed at the office during  your follow-up visit. You may find that a light gauze bandage over your incision may keep your staples from being rubbed or pulled. You may shower and replace the bandage daily. 7. ACTIVITIES:  You may resume regular (light) daily activities beginning the next day--such as daily self-care, walking, climbing stairs--gradually increasing activities as tolerated.  You may have sexual intercourse when it is comfortable.  Refrain from any heavy lifting or straining until approved by your doctor. a. You may drive when you no longer are taking prescription pain medication, you can comfortably wear a seatbelt, and you can safely maneuver your car and apply brakes b. Return to Work: ___________________________________ 8. You should see your doctor in the office for a follow-up appointment approximately two weeks after your surgery.  Make sure that you call for this appointment within a day or two after you arrive home to insure a convenient appointment time. OTHER INSTRUCTIONS:  _____________________________________________________________ _____________________________________________________________  WHEN TO CALL YOUR DOCTOR: 1. Fever over 101.0 2. Inability to urinate 3. Nausea and/or vomiting 4. Extreme swelling or bruising 5. Continued bleeding from incision. 6. Increased pain, redness, or drainage from the incision.  The clinic staff is available to answer your questions during regular business hours.  Please don't hesitate to call and ask to speak to one of the nurses if you have concerns.  For further questions, please visit [email or web address]

## 2011-12-22 LAB — CBC
HCT: 30.9 % — ABNORMAL LOW (ref 36.0–46.0)
Hemoglobin: 10.1 g/dL — ABNORMAL LOW (ref 12.0–15.0)
RDW: 22.2 % — ABNORMAL HIGH (ref 11.5–15.5)
WBC: 6.8 10*3/uL (ref 4.0–10.5)

## 2011-12-22 LAB — BASIC METABOLIC PANEL
BUN: 3 mg/dL — ABNORMAL LOW (ref 6–23)
Chloride: 100 mEq/L (ref 96–112)
GFR calc Af Amer: >90 mL/min (ref >90)
GFR calc non Af Amer: >90 mL/min (ref >90)
Glucose, Bld: 88 mg/dL (ref 70–99)
Potassium: 2.9 mEq/L — ABNORMAL LOW (ref 3.5–5.1)
Sodium: 137 mEq/L (ref 135–145)

## 2011-12-22 MED ORDER — POTASSIUM CHLORIDE CRYS ER 20 MEQ PO TBCR
60.0000 meq | EXTENDED_RELEASE_TABLET | Freq: Once | ORAL | Status: AC
  Administered 2011-12-22: 60 meq via ORAL
  Filled 2011-12-22: qty 3

## 2011-12-22 MED ORDER — LEVOTHYROXINE SODIUM 50 MCG PO TABS
50.0000 ug | ORAL_TABLET | Freq: Every day | ORAL | Status: DC
  Administered 2011-12-23 – 2011-12-24 (×2): 50 ug via ORAL
  Filled 2011-12-22 (×3): qty 1

## 2011-12-22 NOTE — Progress Notes (Signed)
3 Days Post-Op  Subjective: Pain controlled. Pt took herself off morphine last pm. Reports a very small tiny loose BM yesterday. Some flatus. Still burping quite a bit. No emesis. Tolerating liquids. Ambulated in hall several times. "I feel really good"  Objective: Vital signs in last 24 hours: Temp:  [97.4 F (36.3 C)-98.1 F (36.7 C)] 97.9 F (36.6 C) (06/21 0557) Pulse Rate:  [83-90] 85  (06/21 0557) Resp:  [15-18] 18  (06/21 0557) BP: (136-162)/(56-76) 148/68 mmHg (06/21 0557) SpO2:  [95 %-100 %] 97 % (06/21 0557) Last BM Date: 12/21/11  Intake/Output from previous day: 06/20 0701 - 06/21 0700 In: 2270.8 [I.V.:2270.8] Out: 300 [Urine:300] Intake/Output this shift:    Alert, nad, smiling cta Reg Soft, incisions c/d/i, no cellulitis. HypoBS. Expected mild TTP. Very mild distension +scds  Lab Results:   Basename 12/22/11 0551 12/20/11 0700  WBC 6.8 6.9  HGB 10.1* 10.3*  HCT 30.9* 32.3*  PLT 244 282   BMET  Basename 12/22/11 0551 12/20/11 0700  NA 137 135  K 2.9* 4.1  CL 100 101  CO2 26 27  GLUCOSE 88 107*  BUN 3* 10  CREATININE 0.53 0.62  CALCIUM 8.5 8.4   PT/INR No results found for this basename: LABPROT:2,INR:2 in the last 72 hours ABG No results found for this basename: PHART:2,PCO2:2,PO2:2,HCO3:2 in the last 72 hours  Studies/Results: No results found.  Anti-infectives: Anti-infectives     Start     Dose/Rate Route Frequency Ordered Stop   12/18/11 1440   ertapenem (INVANZ) 1 g in sodium chloride 0.9 % 50 mL IVPB        1 g 100 mL/hr over 30 Minutes Intravenous 60 min pre-op 12/18/11 1440 12/19/11 0724          Assessment/Plan: s/p Procedure(s) (LRB): COLON RESECTION LAPAROSCOPIC (N/A) Stay on liquids today, hopefully will be able to advance Saturday Hypokalemia - replace potassium Anemia - hgb stable VTE prophylaxis - cont scds, chemical VTE prophylaxis F/E/N - cont IVF at 75; lytes as above, clears today  Mary Sella. Andrey Campanile, MD,  FACS General, Bariatric, & Minimally Invasive Surgery [facility], [place]   LOS: 3 days    Atilano Ina 12/22/2011

## 2011-12-23 LAB — BASIC METABOLIC PANEL
CO2: 26 mEq/L (ref 19–32)
Chloride: 100 mEq/L (ref 96–112)
Creatinine, Ser: 0.53 mg/dL (ref 0.50–1.10)
GFR calc Af Amer: >90 mL/min (ref >90)
Potassium: 3.4 mEq/L — ABNORMAL LOW (ref 3.5–5.1)

## 2011-12-23 LAB — MAGNESIUM: Magnesium: 1.4 mg/dL — ABNORMAL LOW (ref 1.5–2.5)

## 2011-12-23 MED ORDER — PANTOPRAZOLE SODIUM 40 MG PO TBEC
40.0000 mg | DELAYED_RELEASE_TABLET | Freq: Every day | ORAL | Status: DC
  Administered 2011-12-23: 40 mg via ORAL
  Filled 2011-12-23: qty 1

## 2011-12-23 NOTE — Progress Notes (Signed)
4 Days Post-Op  Subjective: MOVING BOWEL FEELS GREAT.  NO NAUSE  Objective: Vital signs in last 24 hours: Temp:  [97.3 F (36.3 C)-98 F (36.7 C)] 97.3 F (36.3 C) (06/22 0602) Pulse Rate:  [78-99] 78  (06/22 0602) Resp:  [16-18] 16  (06/22 0602) BP: (127-149)/(62-73) 127/62 mmHg (06/22 0602) SpO2:  [93 %-100 %] 93 % (06/22 0602) Last BM Date: 12/22/11  Intake/Output from previous day: 06/21 0701 - 06/22 0700 In: 1380 [I.V.:1380] Out: -  Intake/Output this shift: Total I/O In: 360 [P.O.:360] Out: -   Incision/Wound:CLEAN DRY INTACT. NON DISTENDED ABDOMEN SOFT NON TENDER  Lab Results:   [facility] 12/22/11 0551  WBC 6.8  HGB 10.1*  HCT 30.9*  PLT 244   BMET  Basename 12/23/11 0500 12/22/11 0551  NA 137 137  K 3.4* 2.9*  CL 100 100  CO2 26 26  GLUCOSE 100* 88  BUN 3* 3*  CREATININE 0.53 0.53  CALCIUM 8.9 8.5   PT/INR No results found for this basename: LABPROT:2,INR:2 in the last 72 hours ABG No results found for this basename: PHART:2,PCO2:2,PO2:2,HCO3:2 in the last 72 hours  Studies/Results: No results found.  Anti-infectives: Anti-infectives     Start     Dose/Rate Route Frequency Ordered Stop   12/18/11 1440   ertapenem (INVANZ) 1 g in sodium chloride 0.9 % 50 mL IVPB        1 g 100 mL/hr over 30 Minutes Intravenous 60 min pre-op 12/18/11 1440 12/19/11 0724          Assessment/Plan: s/p Procedure(s) (LRB): COLON RESECTION LAPAROSCOPIC (N/Clark) Advance diet D/C IVF MAYBE HOME SUNDAY  LOS: 4 days    Rachel Clark. 12/23/2011

## 2011-12-24 MED ORDER — OXYCODONE-ACETAMINOPHEN 5-325 MG PO TABS: 1.0000 | ORAL_TABLET | ORAL | Status: AC | PRN

## 2011-12-24 NOTE — Progress Notes (Signed)
5 Days Post-Op  Subjective: Looks great.  BM and tolerating diet.  Objective: Vital signs in last 24 hours: Temp:  [97.6 F (36.4 C)-98.1 F (36.7 C)] 98.1 F (36.7 C) (06/23 0532) Pulse Rate:  [70-87] 80  (06/23 0532) Resp:  [17] 17  (06/23 0532) BP: (133-145)/(67-84) 133/69 mmHg (06/23 0532) SpO2:  [92 %-96 %] 92 % (06/23 0532) Last BM Date: 12/24/11  Intake/Output from previous day: 06/22 0701 - 06/23 0700 In: 1080 [P.O.:1080] Out: -  Intake/Output this shift:    Incision/Wound:clean dry intact.  Soft non tender  And non distended.  Lab Results:   [facility] 12/22/11 0551  WBC 6.8  HGB 10.1*  HCT 30.9*  PLT 244   BMET  Basename 12/23/11 0500 12/22/11 0551  NA 137 137  K 3.4* 2.9*  CL 100 100  CO2 26 26  GLUCOSE 100* 88  BUN 3* 3*  CREATININE 0.53 0.53  CALCIUM 8.9 8.5   PT/INR No results found for this basename: LABPROT:2,INR:2 in the last 72 hours ABG No results found for this basename: PHART:2,PCO2:2,PO2:2,HCO3:2 in the last 72 hours  Studies/Results: No results found.  Anti-infectives: Anti-infectives     Start     Dose/Rate Route Frequency Ordered Stop   12/18/11 1440   ertapenem (INVANZ) 1 g in sodium chloride 0.9 % 50 mL IVPB        1 g 100 mL/hr over 30 Minutes Intravenous 60 min pre-op 12/18/11 1440 12/19/11 0724          Assessment/Plan: s/p Procedure(s) (LRB): COLON RESECTION LAPAROSCOPIC (N/A) Discharge  LOS: 5 days    Rachel Clark A. 12/24/2011

## 2011-12-27 ENCOUNTER — Encounter (INDEPENDENT_AMBULATORY_CARE_PROVIDER_SITE_OTHER): Payer: BC Managed Care – PPO

## 2011-12-27 NOTE — Discharge Summary (Signed)
Physician Discharge Summary  Patient ID: Rachel Clark MRN: [id number] DOB/AGE: January 05, 1951 61 y.o.  Admit date: 12/19/2011 Discharge date: 12/27/2011  Admission Diagnoses: Colon cancer  Discharge Diagnoses:  T3N0 colon cancer  Discharged Condition: good  Hospital Course: 38 yof with multiple medical problems including anemia.  Underwent colonoscopy with finding of transverse colon cancer.  She was admitted and underwent laparoscopic transverse colectomy.  Postoperatively her ileus resolved and she was tolerating oral medication, diet.  Pain was controlled and she was discharged home.  Consults: None  Significant Diagnostic Studies: none  Treatments: surgery: laparoscopic partial colectomy    Disposition: 01-Home or Self Care  Discharge Orders    Future Appointments: Provider: Department: Dept Phone: Center:   01/03/2012 10:30 AM Emelia Loron, MD [facility] [phone] None   01/15/2012 9:30 AM Dava Najjar Idelle Jo [facility] [phone] None   01/15/2012 10:00 AM Exie Parody, MD [facility] [phone] None   02/16/2012 8:30 AM Mauri Brooklyn [facility] [phone] None   02/23/2012 9:45 AM Myrtis Ser, NP [facility] [phone] None   07/18/2012 8:30 AM Alvina Filbert [facility] [phone] None   07/25/2012 9:30 AM Exie Parody, MD [facility] [phone] None     Future Orders Please Complete By Expires   Diet general      Scheduling Instructions:   Soft diet low residue   Increase activity slowly      Driving Restrictions      Comments:   Until released by MD   Lifting restrictions      Comments:   NO LIFTING FOR 4 WEEKS   Discharge instructions      Comments:   Call DR Doreen Salvage office Monday for appointment.  Soft diet.  Ok to shower.   No dressing needed        Medication List  As of 12/27/2011  8:07 PM   STOP taking these medications         NU-IRON 150 MG capsule         TAKE these medications        carbamazepine 100 MG 12 hr tablet   Commonly known as: TEGRETOL XR   Take 500 mg by mouth 2 (two) times daily.      diphenhydrAMINE 25 MG tablet   Commonly known as: BENADRYL   Take 25 mg by mouth every 6 (six) hours as needed. For allergies.      DULoxetine 60 MG capsule   Commonly known as: CYMBALTA   Take 60 mg by mouth every morning.      estradiol 0.075 MG/24HR   Commonly known as: VIVELLE-DOT   Place 1 patch onto the skin 2 (two) times a week. On Sundays and Wednesdays.      ethosuximide 250 MG capsule   Commonly known as: ZARONTIN   Take 250-500 mg by mouth 3 (three) times daily. Take 1 tablet every morning, take 2 tablets every afternoon, and take 2 tablets every night at bedtime.      levothyroxine 50 MCG tablet   Commonly known as: SYNTHROID, LEVOTHROID   Take 50 mcg by mouth daily.      nitroGLYCERIN 0.4 MG SL tablet   Commonly known as: NITROSTAT   Place 0.4 mg under the tongue every 5 (five) minutes as needed. For chest pain.      oxyCODONE-acetaminophen 5-325 MG per tablet   Commonly known as: PERCOCET   Take 1 tablet by mouth every 4 (four) hours as needed  for pain.      pantoprazole 40 MG tablet   Commonly known as: PROTONIX   Take 1 tablet (40 mg total) by mouth every morning.      prasugrel 10 MG Tabs   Commonly known as: EFFIENT   Take 10 mg by mouth every morning.           Follow-up Information    Follow up with [facility], MD in 1 week. (We will call with appointment)    Contact information:   [facility] [address] [place] [address] [phone]          Signed: Emelia Loron 12/27/2011, 8:07 PM

## 2011-12-28 ENCOUNTER — Telehealth: Payer: Self-pay

## 2011-12-28 NOTE — Telephone Encounter (Signed)
Received fax for prior authorization for pantoprazole 40 mg daily.[id number] [phone] called and pantoprazole approved.[facility] at [address] called spoke to pharmacist they will get prescription ready for patient.Patient called no answer.Left message on personal voice mail pantoprazole was [facility] and [facility].

## 2012-01-03 ENCOUNTER — Encounter (INDEPENDENT_AMBULATORY_CARE_PROVIDER_SITE_OTHER): Payer: Self-pay | Admitting: General Surgery

## 2012-01-03 ENCOUNTER — Ambulatory Visit (INDEPENDENT_AMBULATORY_CARE_PROVIDER_SITE_OTHER): Payer: BC Managed Care – PPO | Admitting: General Surgery

## 2012-01-03 VITALS — BP 126/82 | HR 86 | Temp 96.9°F | Resp 16 | Ht 69.0 in | Wt 186.0 lb

## 2012-01-03 DIAGNOSIS — Z09 Encounter for follow-up examination after completed treatment for conditions other than malignant neoplasm: Secondary | ICD-10-CM

## 2012-01-03 NOTE — Progress Notes (Signed)
Subjective:     Patient ID: Rachel Clark, female   DOB: [birth year], 61 y.o.   MRN: [id number]  HPI 61 yof s/p laparoscopic transverse colectomy for T3N0 tumor.  She is doing well without complaints.  She is having bms, no n/v, and eating well.  Review of Systems     Objective:   Physical Exam    well healed incisions without infection  Assessment:     S/p colectomy, colon cancer    Plan:     She will begin eating what she wants and in 2 more weeks will increase activity We discussed path again and she will see Dr. Gaylyn Rong to discuss adjuvant therapy I will see her back based on visit with Dr. Gaylyn Rong

## 2012-01-12 ENCOUNTER — Telehealth: Payer: Self-pay | Admitting: Oncology

## 2012-01-12 ENCOUNTER — Other Ambulatory Visit: Payer: Self-pay | Admitting: Oncology

## 2012-01-12 NOTE — Telephone Encounter (Signed)
lmonvm adviisng the pt of her r/s July appts to aug per the md's request

## 2012-01-15 ENCOUNTER — Other Ambulatory Visit (HOSPITAL_COMMUNITY)
Admission: RE | Admit: 2012-01-15 | Discharge: 2012-01-15 | Disposition: A | Discharge status: Home / Self Care | Payer: BC Managed Care – PPO | Source: Ambulatory Visit | Attending: Oncology | Admitting: Oncology

## 2012-01-15 ENCOUNTER — Ambulatory Visit: Payer: BC Managed Care – PPO | Admitting: Oncology

## 2012-01-15 ENCOUNTER — Other Ambulatory Visit: Payer: BC Managed Care – PPO | Admitting: Lab

## 2012-01-15 DIAGNOSIS — C189 Malignant neoplasm of colon, unspecified: Secondary | ICD-10-CM | POA: Insufficient documentation

## 2012-01-17 ENCOUNTER — Other Ambulatory Visit: Payer: BC Managed Care – PPO | Admitting: Lab

## 2012-01-25 ENCOUNTER — Telehealth: Payer: Self-pay | Admitting: *Deleted

## 2012-01-25 NOTE — Telephone Encounter (Signed)
Left Vm for pt informing Dr. Gaylyn Rong will go over results on next appt on 8/08 but to call back if she wants to come in sooner.

## 2012-01-25 NOTE — Telephone Encounter (Signed)
She has a visit with me on 02/08/12.  If she cannot wait that long, I can meet her next week to go over the result (just let me know so that I can turn in a POF).  Thanks.

## 2012-01-25 NOTE — Telephone Encounter (Signed)
Pt left VM requesting results of "all my tests."

## 2012-02-08 ENCOUNTER — Encounter: Payer: Self-pay | Admitting: Oncology

## 2012-02-08 ENCOUNTER — Telehealth: Payer: Self-pay | Admitting: *Deleted

## 2012-02-08 ENCOUNTER — Other Ambulatory Visit (HOSPITAL_BASED_OUTPATIENT_CLINIC_OR_DEPARTMENT_OTHER): Payer: BC Managed Care – PPO | Admitting: Lab

## 2012-02-08 ENCOUNTER — Ambulatory Visit (HOSPITAL_BASED_OUTPATIENT_CLINIC_OR_DEPARTMENT_OTHER): Payer: BC Managed Care – PPO | Admitting: Oncology

## 2012-02-08 VITALS — BP 148/89 | HR 88 | Temp 97.3°F | Resp 18 | Ht 69.0 in | Wt 184.6 lb

## 2012-02-08 DIAGNOSIS — D472 Monoclonal gammopathy: Secondary | ICD-10-CM

## 2012-02-08 DIAGNOSIS — C189 Malignant neoplasm of colon, unspecified: Secondary | ICD-10-CM

## 2012-02-08 DIAGNOSIS — C184 Malignant neoplasm of transverse colon: Secondary | ICD-10-CM

## 2012-02-08 DIAGNOSIS — D509 Iron deficiency anemia, unspecified: Secondary | ICD-10-CM

## 2012-02-08 LAB — COMPREHENSIVE METABOLIC PANEL
ALT: 10 U/L (ref 0–35)
Albumin: 4.1 g/dL (ref 3.5–5.2)
CO2: 26 mEq/L (ref 19–32)
Calcium: 9.1 mg/dL (ref 8.4–10.5)
Chloride: 103 mEq/L (ref 96–112)
Creatinine, Ser: 0.62 mg/dL (ref 0.50–1.10)
Potassium: 4.4 mEq/L (ref 3.5–5.3)
Sodium: 137 mEq/L (ref 135–145)
Total Protein: 7.2 g/dL (ref 6.0–8.3)

## 2012-02-08 LAB — CBC WITH DIFFERENTIAL/PLATELET
EOS%: 4.3 % (ref 0.0–7.0)
LYMPH%: 30.4 % (ref 14.0–49.7)
MCH: 28.4 pg (ref 25.1–34.0)
MCHC: 34.2 g/dL (ref 31.5–36.0)
MCV: 83.1 fL (ref 79.5–101.0)
MONO%: 7 % (ref 0.0–14.0)
RBC: 4.86 10*6/uL (ref 3.70–5.45)
RDW: 18.6 % — ABNORMAL HIGH (ref 11.2–14.5)
nRBC: 0 % (ref 0–0)

## 2012-02-08 LAB — FERRITIN: Ferritin: 23 ng/mL (ref 10–291)

## 2012-02-08 MED ORDER — CAPECITABINE 500 MG PO TABS: 1000.0000 mg/m2 | ORAL_TABLET | Freq: Two times a day (BID) | ORAL | Status: DC

## 2012-02-08 NOTE — Telephone Encounter (Signed)
Gave patient appointment for 02-29-2012 starting at 9:30am with labs

## 2012-02-08 NOTE — Progress Notes (Signed)
[facility]  [phone] [phone]   OFFICE PROGRESS NOTE   Cc:  PICKETT,KATHERINE, PA  DIAGNOSIS AND PAST THERAPY:  1.  MGUS:  Watchful observation. 2.  Newly diagnosed pT3 N0 M0 transverse colon adenocarcinoma.  S/p laparoscopic-assisted transverse colectomy by Dr. Dwain Sarna on 12/19/2011.   CURRENT THERAPY:  Here to discuss need for adjuvant therapy.   INTERVAL HISTORY: Rachel Clark 61 y.o. female returns for regular follow up.  She underwent resection without problem.  She recovered well.  She has slight fatigue, and has not yet returned to work.  However, she is independent of all activities of daily living.    Patient denies fever, anorexia, weight loss, fatigue, headache, visual changes, confusion, drenching night sweats, palpable lymph node swelling, mucositis, odynophagia, dysphagia, nausea vomiting, jaundice, chest pain, palpitation, shortness of breath, dyspnea on exertion, productive cough, gum bleeding, epistaxis, hematemesis, hemoptysis, abdominal pain, abdominal swelling, early satiety, melena, hematochezia, hematuria, skin rash, spontaneous bleeding, joint swelling, joint pain, heat or cold intolerance, bowel bladder incontinence, back pain, focal motor weakness, paresthesia, depression, suicidal or homicidal ideation, feeling hopelessness.   Past Medical History  Diagnosis Date  . Coronary artery disease     Has had remote NSTEMI with PCI to the RCA; S/P CABG in 2007; LAST CATH WITH MODERATE DISEASE IN A VERY SMALL DIAGONAL BRANCH; SHE IS MANAGED MEDICALLY  . Dyslipidemia   . Seizure disorder   . History of fibromyalgia   . Chest pain   . Monoclonal gammopathy of unknown significance   . Colon cancer 12/04/2011    s/p Laparoscopic-assisted transverse colectomy on 12/19/2011 by Dr. Dwain Sarna.  pT3 N0 M0.   Marland Kitchen Hyperlipidemia   . Myocardial infarction 2007  . PONV (postoperative nausea and vomiting)   . Bronchitis     hx  of;>33yrs  . Headache   . Seizures 12/06/11    last seizure over 40 yrs ago;takes Tegretol   . Fibromyalgia   . Arthritis     hands   . GERD (gastroesophageal reflux disease)     takes Pantoprazole daily  . Gastric ulcer   . Iron deficiency anemia 11/17/2011  . Hypothyroid     takes Levothyroxine daily  . Depression     takes Cymbalta daily    Past Surgical History  Procedure Date  . Partial hysterectomy 20+yrs ago  . Posterior laminectomy / decompression cervical spine     20+yrs ago  . Cardiac catheterization 05/20/2009    obstructive native vessel disease in LAD, RCA, and first diagonal, patent vein graft to distal RCA and LIMA to LAD,normal. ef 60%  . Colonoscopy   . Coronary artery bypass graft 2007    x 2  . Coronary angioplasty     1 stent  . Colon resection 12/19/2011    Procedure: COLON RESECTION LAPAROSCOPIC;  Surgeon: Emelia Loron, MD;  Location: [facility] OR;  Service: General;  Laterality: N/A;  laparoscopic hand assisted partial colon resection    Current Outpatient Prescriptions  Medication Sig Dispense Refill  . carbamazepine (TEGRETOL XR) 100 MG 12 hr tablet Take 500 mg by mouth 2 (two) times daily.      . diphenhydrAMINE (BENADRYL) 25 MG tablet Take 25 mg by mouth every 6 (six) hours as needed. For allergies.      . DULoxetine (CYMBALTA) 60 MG capsule Take 60 mg by mouth every morning.      Marland Kitchen estradiol (VIVELLE-DOT) 0.075 MG/24HR Place 1 patch onto the skin 2 (two)  times a week. On Sundays and Wednesdays.      Marland Kitchen ethosuximide (ZARONTIN) 250 MG capsule Take 250-500 mg by mouth 3 (three) times daily. Take 1 tablet every morning, take 2 tablets every afternoon, and take 2 tablets every night at bedtime.      Marland Kitchen levothyroxine (SYNTHROID, LEVOTHROID) 50 MCG tablet Take 50 mcg by mouth daily.       . nitroGLYCERIN (NITROSTAT) 0.4 MG SL tablet Place 0.4 mg under the tongue every 5 (five) minutes as needed. For chest pain.      . capecitabine (XELODA) 500 MG tablet Take 4  tablets (2,000 mg total) by mouth 2 (two) times daily after a meal.  112 tablet  0  . pantoprazole (PROTONIX) 40 MG tablet Take 1 tablet (40 mg total) by mouth every morning.  30 tablet  0  . prasugrel (EFFIENT) 10 MG TABS Take 10 mg by mouth every morning.        ALLERGIES:  is allergic to aspirin; erythromycin; imdur; lisinopril; niacin and related; penicillins; statins; sulfa drugs cross reactors; and tetracyclines & related.  REVIEW OF SYSTEMS:  The rest of the 14-point review of system was negative.   Filed Vitals:   02/08/12 1012  BP: 148/89  Pulse: 88  Temp: 97.3 F (36.3 C)  Resp: 18   Wt Readings from Last 3 Encounters:  02/08/12 184 lb 9.6 oz (83.734 kg)  01/03/12 186 lb (84.369 kg)  12/20/11 192 lb 10.9 oz (87.4 kg)   ECOG Performance status: 0  PHYSICAL EXAMINATION:   General:  well-nourished woman, in no acute distress.  Eyes:  no scleral icterus.  ENT:  There were no oropharyngeal lesions.  Neck was without thyromegaly.  Lymphatics:  Negative cervical, supraclavicular or axillary adenopathy.  Respiratory: lungs were clear bilaterally without wheezing or crackles.  Cardiovascular:  Regular rate and rhythm, S1/S2, without murmur, rub or gallop.  There was no pedal edema.  GI:  abdomen was soft, flat, nontender, nondistended, without organomegaly.  Abdominal incisional wounds have healed without pain, purulent discharge, erythema.  Muscoloskeletal:  no spinal tenderness of palpation of vertebral spine.  Skin exam was without echymosis, petichae.  Neuro exam was nonfocal.  Patient was able to get on and off exam table without assistance.  Gait was normal.  Patient was alerted and oriented.  Attention was good.   Language was appropriate.  Mood was normal without depression.  Speech was not pressured.  Thought content was not tangential.    LABORATORY/RADIOLOGY DATA:  Lab Results  Component Value Date   WBC 6.6 02/08/2012   HGB 13.8 02/08/2012   HCT 40.4 02/08/2012   PLT 297  02/08/2012   GLUCOSE 97 02/08/2012   CHOL 268* 08/17/2011   TRIG 107 08/17/2011   HDL 82 08/17/2011   LDLCALC 165* 08/17/2011   ALKPHOS 86 02/08/2012   ALT 10 02/08/2012   AST 11 02/08/2012   NA 137 02/08/2012   K 4.4 02/08/2012   CL 103 02/08/2012   CREATININE 0.62 02/08/2012   BUN 15 02/08/2012   CO2 26 02/08/2012   INR 1.03 08/17/2011   HGBA1C 5.7* 08/16/2011     ASSESSMENT AND PLAN:   1.  Diagnosis:  Stage II, normal risk (not high risk) colon cancer.  Chance of cure in stage II cancer after resection is about 70-80%.   2.  Recommendation:   - Only marginal survival benefit with chemotherapy compared to observation.  Therefore, most guidelines do not recommend chemo in  this situation. - If patients want chemotherapy, then Xeloda chemo pill (aka capecitabine) is the treatment of choice.   Potential side effects include but not limited to fatigue, hair thinning, mouth sore, low blood count, bleeding, infection, diarrhea, skin rash, hand foot syndrome, chest pain.  3.  She had already decided to proceed with Xeloda 4 tablets (each 500mg  each; for total dose of 2,000mg  by mouth twice daily; 2 weeks on; 1 week of).  She expressed informed understanding that the absolute survival benefit with adjuvant chemo in stage II low risk colon cancer is low.  She also was aware of the potential side effects of Xeloda.  Even with adjuvant chemotherpy, there may be risk of recurrence.   4.  Follow up:  Every 3 weeks for now to monitor the side effects of Xeloda.  If you do well, we will see each other every 6 weeks.   5.  Long term follow up:  CT scan once a year.  Repeat colonoscopy about one year from the original colonoscopy.   6.  MGUS:  Next testing in September 2013.  There has been no evidence of progression to active myeloma.

## 2012-02-08 NOTE — Patient Instructions (Addendum)
1.  Diagnosis:  Stage II, normal risk (not high risk) colon cancer.  Chance of cure in stage II cancer after resection is about 70-80%.  2.  Recommendation:   - Only marginal survival benefit with chemotherapy compared to observation.  Therefore, most guidelines do not recommend chemo in this situation. - If patients want chemotherapy, then Xeloda chemo pill (aka capecitabine) is the treatment of choice.   Potential side effects include but not limited to fatigue, hair thinning, mouth sore, low blood count, bleeding, infection, diarrhea, skin rash, hand foot syndrome, chest pain.  3.  You've decided to proceed with Xeloda 4 tablets (each 500mg  each; for total dose of 2,000mg  by mouth twice daily; 2 weeks on; 1 week of).  4.  Follow up:  Every 3 weeks for now to monitor the side effects of Xeloda.  If you do well, we will see each other every 6 weeks.  5.  Long term follow up:  CT scan once a year.  Repeat colonoscopy about one year from the original colonoscopy.  6.  MGUS:  Next testing in September 2013.

## 2012-02-09 ENCOUNTER — Encounter: Payer: Self-pay | Admitting: Oncology

## 2012-02-09 ENCOUNTER — Encounter: Payer: Self-pay | Admitting: *Deleted

## 2012-02-09 ENCOUNTER — Encounter: Payer: Self-pay | Admitting: Internal Medicine

## 2012-02-09 NOTE — Progress Notes (Unsigned)
Faxed xeloda prescription to [facility]. °

## 2012-02-09 NOTE — Progress Notes (Signed)
Rx for Xeloda given to Rachel Clark in [facility] Dept.. For prior authorization.

## 2012-02-09 NOTE — Progress Notes (Signed)
[facility] [id number], pomalyst 2mg  is approved from 02/09/12-10/09/12.

## 2012-02-16 ENCOUNTER — Other Ambulatory Visit: Payer: BC Managed Care – PPO | Admitting: Lab

## 2012-02-23 ENCOUNTER — Ambulatory Visit: Payer: BC Managed Care – PPO | Admitting: Oncology

## 2012-02-27 ENCOUNTER — Other Ambulatory Visit: Payer: Self-pay | Admitting: *Deleted

## 2012-02-27 DIAGNOSIS — C189 Malignant neoplasm of colon, unspecified: Secondary | ICD-10-CM

## 2012-02-27 DIAGNOSIS — D472 Monoclonal gammopathy: Secondary | ICD-10-CM

## 2012-02-27 MED ORDER — CAPECITABINE 500 MG PO TABS: 1000.0000 mg/m2 | ORAL_TABLET | Freq: Two times a day (BID) | ORAL | Status: DC

## 2012-02-29 ENCOUNTER — Ambulatory Visit (HOSPITAL_BASED_OUTPATIENT_CLINIC_OR_DEPARTMENT_OTHER): Payer: BC Managed Care – PPO | Admitting: Oncology

## 2012-02-29 ENCOUNTER — Other Ambulatory Visit (HOSPITAL_BASED_OUTPATIENT_CLINIC_OR_DEPARTMENT_OTHER): Payer: BC Managed Care – PPO | Admitting: Lab

## 2012-02-29 ENCOUNTER — Telehealth: Payer: Self-pay | Admitting: Oncology

## 2012-02-29 VITALS — BP 136/83 | HR 76 | Temp 96.9°F | Resp 20 | Ht 69.0 in | Wt 187.1 lb

## 2012-02-29 DIAGNOSIS — C189 Malignant neoplasm of colon, unspecified: Secondary | ICD-10-CM

## 2012-02-29 DIAGNOSIS — D472 Monoclonal gammopathy: Secondary | ICD-10-CM

## 2012-02-29 DIAGNOSIS — C184 Malignant neoplasm of transverse colon: Secondary | ICD-10-CM

## 2012-02-29 LAB — CBC WITH DIFFERENTIAL/PLATELET
BASO%: 0.1 % (ref 0.0–2.0)
EOS%: 4.1 % (ref 0.0–7.0)
LYMPH%: 33.4 % (ref 14.0–49.7)
MCH: 29 pg (ref 25.1–34.0)
MCHC: 34.4 g/dL (ref 31.5–36.0)
MONO#: 0.5 10*3/uL (ref 0.1–0.9)
RBC: 4.65 10*6/uL (ref 3.70–5.45)
WBC: 6.9 10*3/uL (ref 3.9–10.3)
lymph#: 2.3 10*3/uL (ref 0.9–3.3)

## 2012-02-29 LAB — COMPREHENSIVE METABOLIC PANEL (CC13)
AST: 9 U/L (ref 5–34)
Albumin: 3.6 g/dL (ref 3.5–5.0)
Alkaline Phosphatase: 100 U/L (ref 40–150)
BUN: 11 mg/dL (ref 7.0–26.0)
Creatinine: 0.7 mg/dL (ref 0.6–1.1)
Glucose: 103 mg/dl — ABNORMAL HIGH (ref 70–99)
Potassium: 4.2 mEq/L (ref 3.5–5.1)
Total Bilirubin: 0.2 mg/dL (ref 0.20–1.20)

## 2012-02-29 MED ORDER — CAPECITABINE 500 MG PO TABS: 1000.0000 mg/m2 | ORAL_TABLET | Freq: Two times a day (BID) | ORAL | Status: AC

## 2012-02-29 NOTE — Telephone Encounter (Signed)
gv pt appt schedule for September/October.

## 2012-02-29 NOTE — Progress Notes (Signed)
[facility]  [phone] Fax:(336) [phone]   OFFICE PROGRESS NOTE   Cc:  PICKETT,KATHERINE, PA  DIAGNOSIS AND PAST THERAPY:  1. MGUS: Watchful observation.  2. Newly diagnosed pT3 N0 M0 transverse colon adenocarcinoma. S/p laparoscopic-assisted transverse colectomy by Dr. Dwain Sarna on 12/19/2011.   CURRENT THERAPY: started adjuvant Xeloda 2,000mg  PO BID in 02/2012.   INTERVAL HISTORY: Rachel Clark 61 y.o. female returns for regular follow up with her husband.  She had mild to moderate fatigue one week into Xeloda.  She only had hot alternating with cold flash last week which has resolved.  She is still independent of activities of daily living.  She denied fever, mucositis, bleeding symptoms, skin rash, diarrhea, chest pain.    Past Medical History  Diagnosis Date  . Coronary artery disease     Has had remote NSTEMI with PCI to the RCA; S/P CABG in 2007; LAST CATH WITH MODERATE DISEASE IN A VERY SMALL DIAGONAL BRANCH; SHE IS MANAGED MEDICALLY  . Dyslipidemia   . Seizure disorder   . History of fibromyalgia   . Chest pain   . Monoclonal gammopathy of unknown significance   . Colon cancer 12/04/2011    s/p Laparoscopic-assisted transverse colectomy on 12/19/2011 by Dr. Dwain Sarna.  pT3 N0 M0.   Marland Kitchen Hyperlipidemia   . Myocardial infarction 2007  . PONV (postoperative nausea and vomiting)   . Bronchitis     hx of;>34yrs  . Headache   . Seizures 12/06/11    last seizure over 40 yrs ago;takes Tegretol   . Fibromyalgia   . Arthritis     hands   . GERD (gastroesophageal reflux disease)     takes Pantoprazole daily  . Gastric ulcer   . Iron deficiency anemia 11/17/2011  . Hypothyroid     takes Levothyroxine daily  . Depression     takes Cymbalta daily    Past Surgical History  Procedure Date  . Partial hysterectomy 20+yrs ago  . Posterior laminectomy / decompression cervical spine     20+yrs ago  . Cardiac catheterization 05/20/2009   obstructive native vessel disease in LAD, RCA, and first diagonal, patent vein graft to distal RCA and LIMA to LAD,normal. ef 60%  . Colonoscopy   . Coronary artery bypass graft 2007    x 2  . Coronary angioplasty     1 stent  . Colon resection 12/19/2011    Procedure: COLON RESECTION LAPAROSCOPIC;  Surgeon: Emelia Loron, MD;  Location: [facility] OR;  Service: General;  Laterality: N/A;  laparoscopic hand assisted partial colon resection    Current Outpatient Prescriptions  Medication Sig Dispense Refill  . capecitabine (XELODA) 500 MG tablet Take 4 tablets (2,000 mg total) by mouth 2 (two) times daily after a meal.  112 tablet  2  . carbamazepine (TEGRETOL XR) 100 MG 12 hr tablet Take 500 mg by mouth 2 (two) times daily.      . diphenhydrAMINE (BENADRYL) 25 MG tablet Take 25 mg by mouth every 6 (six) hours as needed. For allergies.      . DULoxetine (CYMBALTA) 60 MG capsule Take 60 mg by mouth every morning.      Marland Kitchen estradiol (VIVELLE-DOT) 0.075 MG/24HR Place 1 patch onto the skin 2 (two) times a week. On Sundays and Wednesdays.      Marland Kitchen ethosuximide (ZARONTIN) 250 MG capsule Take 250-500 mg by mouth 3 (three) times daily. Take 1 tablet every morning, take 2 tablets every afternoon, and take 2  tablets every night at bedtime.      Marland Kitchen levothyroxine (SYNTHROID, LEVOTHROID) 50 MCG tablet Take 50 mcg by mouth daily.       . nitroGLYCERIN (NITROSTAT) 0.4 MG SL tablet Place 0.4 mg under the tongue every 5 (five) minutes as needed. For chest pain.      . pantoprazole (PROTONIX) 40 MG tablet Take 1 tablet (40 mg total) by mouth every morning.  30 tablet  0  . prasugrel (EFFIENT) 10 MG TABS Take 10 mg by mouth every morning.        ALLERGIES:  is allergic to aspirin; erythromycin; imdur; lisinopril; niacin and related; penicillins; statins; sulfa drugs cross reactors; and tetracyclines & related.  REVIEW OF SYSTEMS:  The rest of the 14-point review of system was negative.   Filed Vitals:   02/29/12 1004   BP: 136/83  Pulse: 76  Temp: 96.9 F (36.1 C)  Resp: 20   Wt Readings from Last 3 Encounters:  02/29/12 187 lb 1.6 oz (84.868 kg)  02/08/12 184 lb 9.6 oz (83.734 kg)  01/03/12 186 lb (84.369 kg)   ECOG Performance status: 1  PHYSICAL EXAMINATION:   General:  well-nourished woman in no acute distress.  Eyes:  no scleral icterus.  ENT:  There were no oropharyngeal lesions.  Neck was without thyromegaly.  Lymphatics:  Negative cervical, supraclavicular or axillary adenopathy.  Respiratory: lungs were clear bilaterally without wheezing or crackles.  Cardiovascular:  Regular rate and rhythm, S1/S2, without murmur, rub or gallop.  There was no pedal edema.  GI:  abdomen was soft, flat, nontender, nondistended, without organomegaly.  Muscoloskeletal:  no spinal tenderness of palpation of vertebral spine.  Skin exam was without echymosis, petichae.  Neuro exam was nonfocal.  Patient was able to get on and off exam table without assistance.  Gait was normal.  Patient was alerted and oriented.  Attention was good.   Language was appropriate.  Mood was normal without depression.  Speech was not pressured.  Thought content was not tangential.     LABORATORY/RADIOLOGY DATA:  Lab Results  Component Value Date   WBC 6.9 02/29/2012   HGB 13.5 02/29/2012   HCT 39.2 02/29/2012   PLT 298 02/29/2012   GLUCOSE 97 02/08/2012   CHOL 268* 08/17/2011   TRIG 107 08/17/2011   HDL 82 08/17/2011   LDLCALC 165* 08/17/2011   ALKPHOS 86 02/08/2012   ALT 10 02/08/2012   AST 11 02/08/2012   NA 137 02/08/2012   K 4.4 02/08/2012   CL 103 02/08/2012   CREATININE 0.62 02/08/2012   BUN 15 02/08/2012   CO2 26 02/08/2012   INR 1.03 08/17/2011   HGBA1C 5.7* 08/16/2011    ASSESSMENT AND PLAN:   1. Diagnosis: Stage II, normal risk (not high risk) colon cancer. - She is almost done with the fist cycle of Xeloda 2000 mg PO BID.  She has grade 1 fatigue; grade 1 hot/cold flash.  These are not severe enough for dose reduction.  She would like  to continue chemo as previously.  - Long term follow up: CT scan once a year (next one is due by June 2014) . Repeat colonoscopy is due by May 2014.   2.   MGUS: Next SPEP and light chain are due in September 2013. There has been no evidence of progression to active myeloma.   3.  Follow up: since she is doing well, we will see her every 2 cycles (6 weeks).  She still has  every 3 weeks lab to start each new cycle of chemo.   The length of time of the face-to-face encounter was 15 minutes. More than 50% of time was spent counseling and coordination of care.

## 2012-03-05 LAB — PROTEIN ELECTROPHORESIS, SERUM
Beta Globulin: 5.3 % (ref 4.7–7.2)
Gamma Globulin: 18.1 % (ref 11.1–18.8)
M-Spike, %: 0.58 g/dL
Total Protein, Serum Electrophoresis: 7 g/dL (ref 6.0–8.3)

## 2012-03-05 LAB — KAPPA/LAMBDA LIGHT CHAINS: Kappa free light chain: 1.46 mg/dL (ref 0.33–1.94)

## 2012-03-13 ENCOUNTER — Other Ambulatory Visit: Payer: Self-pay | Admitting: Physician Assistant

## 2012-03-13 ENCOUNTER — Other Ambulatory Visit (HOSPITAL_COMMUNITY)
Admission: RE | Admit: 2012-03-13 | Discharge: 2012-03-13 | Disposition: A | Discharge status: Home / Self Care | Payer: BC Managed Care – PPO | Source: Ambulatory Visit | Attending: Family Medicine | Admitting: Family Medicine

## 2012-03-13 DIAGNOSIS — Z0000 Encounter for general adult medical examination without abnormal findings: Secondary | ICD-10-CM | POA: Insufficient documentation

## 2012-03-19 ENCOUNTER — Telehealth: Payer: Self-pay | Admitting: Oncology

## 2012-03-19 NOTE — Telephone Encounter (Signed)
Per 9/17 pof changed time of 9/19 lb to 9:30 am. No availability and after speaking w/pt appt moved to 9/20. Pt has new d/t.

## 2012-03-20 ENCOUNTER — Telehealth: Payer: Self-pay | Admitting: Oncology

## 2012-03-20 NOTE — Telephone Encounter (Signed)
S.W. pt and advised of appt d.t. change.Marland KitchenMarland Kitchenpt was in car....mailed appt schedule....sed

## 2012-03-21 ENCOUNTER — Other Ambulatory Visit: Payer: BC Managed Care – PPO | Admitting: Lab

## 2012-03-22 ENCOUNTER — Other Ambulatory Visit: Payer: BC Managed Care – PPO | Admitting: Lab

## 2012-03-25 ENCOUNTER — Telehealth: Payer: Self-pay | Admitting: Oncology

## 2012-03-25 NOTE — Telephone Encounter (Signed)
pt called and wanted to reschedule her lab and appt changed to 9.25.13....sed

## 2012-03-26 NOTE — Progress Notes (Unsigned)
Patient reports that she had 1 incident of vomiting and 2 incidents of diarrhea last night; stated that she is fine today; patient would prefer not to take any anti-nausea medications at this time; patient to drink plenty of liquids and call office if she has any further concerns; verbalized understanding.

## 2012-03-27 ENCOUNTER — Telehealth: Payer: Self-pay | Admitting: Oncology

## 2012-03-27 ENCOUNTER — Encounter: Payer: BC Managed Care – PPO | Admitting: Oncology

## 2012-03-27 ENCOUNTER — Other Ambulatory Visit: Payer: BC Managed Care – PPO | Admitting: Lab

## 2012-03-27 NOTE — Progress Notes (Signed)
Patient no showed for appointment on 03/27/12. POF to scheduler to reschedule patient within the next 7-10 days. This encounter was created in error - please disregard.

## 2012-03-27 NOTE — Telephone Encounter (Signed)
S/w pt re appt for 10/3.

## 2012-03-28 ENCOUNTER — Telehealth: Payer: Self-pay | Admitting: Oncology

## 2012-03-28 NOTE — Telephone Encounter (Signed)
Pt came to office to pick up schedule for OCT wanted to wait for more recent upcomming schedule.

## 2012-04-01 ENCOUNTER — Ambulatory Visit (INDEPENDENT_AMBULATORY_CARE_PROVIDER_SITE_OTHER): Payer: BC Managed Care – PPO | Admitting: General Surgery

## 2012-04-01 ENCOUNTER — Encounter (INDEPENDENT_AMBULATORY_CARE_PROVIDER_SITE_OTHER): Payer: Self-pay | Admitting: General Surgery

## 2012-04-01 VITALS — BP 130/82 | HR 86 | Temp 97.0°F | Ht 69.0 in | Wt 187.0 lb

## 2012-04-01 DIAGNOSIS — Z85038 Personal history of other malignant neoplasm of large intestine: Secondary | ICD-10-CM

## 2012-04-01 NOTE — Progress Notes (Signed)
Subjective:     Patient ID: Rachel Clark, female   DOB: 25-Feb-1951, 61 y.o.   MRN: [id number]  HPI This is a 61 year old female I did a laparoscopic-assisted transverse colectomy. She is now on xeloda and being followed by medical oncology. She came back in today just for a final check after operation. She is doing well without any complaints. She is having bowel movements fairly regularly. She is tolerating her xeloda well also. She has no real complaints.  Review of Systems     Objective:   Physical Exam Well-healed incisions without any evidence of infection    Assessment:     Status post laparoscopic transverse colectomy for colon cancer    Plan:     She is going to see me as needed at this point. Otherwise she will continue followup with medical oncology.

## 2012-04-04 ENCOUNTER — Telehealth: Payer: Self-pay | Admitting: Oncology

## 2012-04-04 ENCOUNTER — Encounter: Payer: Self-pay | Admitting: Oncology

## 2012-04-04 ENCOUNTER — Other Ambulatory Visit (HOSPITAL_BASED_OUTPATIENT_CLINIC_OR_DEPARTMENT_OTHER): Payer: BC Managed Care – PPO | Admitting: Lab

## 2012-04-04 ENCOUNTER — Ambulatory Visit (HOSPITAL_BASED_OUTPATIENT_CLINIC_OR_DEPARTMENT_OTHER): Payer: BC Managed Care – PPO | Admitting: Oncology

## 2012-04-04 VITALS — BP 150/91 | HR 89 | Temp 97.2°F | Resp 20 | Ht 69.0 in | Wt 185.1 lb

## 2012-04-04 DIAGNOSIS — D472 Monoclonal gammopathy: Secondary | ICD-10-CM

## 2012-04-04 DIAGNOSIS — C189 Malignant neoplasm of colon, unspecified: Secondary | ICD-10-CM

## 2012-04-04 DIAGNOSIS — C184 Malignant neoplasm of transverse colon: Secondary | ICD-10-CM

## 2012-04-04 LAB — CBC WITH DIFFERENTIAL/PLATELET
Basophils Absolute: 0 10*3/uL (ref 0.0–0.1)
EOS%: 4.5 % (ref 0.0–7.0)
Eosinophils Absolute: 0.3 10*3/uL (ref 0.0–0.5)
HCT: 40.2 % (ref 34.8–46.6)
HGB: 13.7 g/dL (ref 11.6–15.9)
LYMPH%: 38.6 % (ref 14.0–49.7)
MCH: 32 pg (ref 25.1–34.0)
MCV: 93.6 fL (ref 79.5–101.0)
MONO%: 7.2 % (ref 0.0–14.0)
NEUT#: 3.1 10*3/uL (ref 1.5–6.5)
NEUT%: 49.4 % (ref 38.4–76.8)
Platelets: 289 10*3/uL (ref 145–400)

## 2012-04-04 LAB — COMPREHENSIVE METABOLIC PANEL (CC13)
AST: 18 U/L (ref 5–34)
Albumin: 4 g/dL (ref 3.5–5.0)
Alkaline Phosphatase: 130 U/L (ref 40–150)
BUN: 15 mg/dL (ref 7.0–26.0)
Creatinine: 0.8 mg/dL (ref 0.6–1.1)
Glucose: 114 mg/dl — ABNORMAL HIGH (ref 70–99)
Potassium: 4.6 mEq/L (ref 3.5–5.1)

## 2012-04-04 NOTE — Progress Notes (Signed)
[facility]  [phone] [phone]   OFFICE PROGRESS NOTE   Cc:  PICKETT,KATHERINE, PA  DIAGNOSIS AND PAST THERAPY:  1. MGUS: Watchful observation.  2. Newly diagnosed pT3 N0 M0 transverse colon adenocarcinoma. S/p laparoscopic-assisted transverse colectomy by Dr. Dwain Sarna on 12/19/2011.   CURRENT THERAPY: started adjuvant Xeloda 2,000mg  PO BID in 02/2012.   INTERVAL HISTORY: Rachel Clark 61 y.o. female returns for regular follow up by herself. She started her second cycle of Xeloda this past weekend. She had mild to moderate fatigue one week into Xeloda.  She only had hot alternating with cold flash last week which has resolved.  She is still independent of activities of daily living.  She denied fever, mucositis, bleeding symptoms, skin rash, diarrhea, chest pain.    Past Medical History  Diagnosis Date  . Coronary artery disease     Has had remote NSTEMI with PCI to the RCA; S/P CABG in 2007; LAST CATH WITH MODERATE DISEASE IN A VERY SMALL DIAGONAL BRANCH; SHE IS MANAGED MEDICALLY  . Dyslipidemia   . Seizure disorder   . History of fibromyalgia   . Chest pain   . Monoclonal gammopathy of unknown significance   . Colon cancer 12/04/2011    s/p Laparoscopic-assisted transverse colectomy on 12/19/2011 by Dr. Dwain Sarna.  pT3 N0 M0.   Marland Kitchen Hyperlipidemia   . Myocardial infarction 2007  . PONV (postoperative nausea and vomiting)   . Bronchitis     hx of;>91yrs  . Headache   . Seizures 12/06/11    last seizure over 40 yrs ago;takes Tegretol   . Fibromyalgia   . Arthritis     hands   . GERD (gastroesophageal reflux disease)     takes Pantoprazole daily  . Gastric ulcer   . Iron deficiency anemia 11/17/2011  . Hypothyroid     takes Levothyroxine daily  . Depression     takes Cymbalta daily    Past Surgical History  Procedure Date  . Partial hysterectomy 20+yrs ago  . Posterior laminectomy / decompression cervical spine     20+yrs ago    . Cardiac catheterization 05/20/2009    obstructive native vessel disease in LAD, RCA, and first diagonal, patent vein graft to distal RCA and LIMA to LAD,normal. ef 60%  . Colonoscopy   . Coronary artery bypass graft 2007    x 2  . Coronary angioplasty     1 stent  . Colon resection 12/19/2011    Procedure: COLON RESECTION LAPAROSCOPIC;  Surgeon: Emelia Loron, MD;  Location: [facility] OR;  Service: General;  Laterality: N/A;  laparoscopic hand assisted partial colon resection    Current Outpatient Prescriptions  Medication Sig Dispense Refill  . carbamazepine (TEGRETOL XR) 100 MG 12 hr tablet Take 500 mg by mouth 2 (two) times daily.      . diphenhydrAMINE (BENADRYL) 25 MG tablet Take 25 mg by mouth every 6 (six) hours as needed. For allergies.      . DULoxetine (CYMBALTA) 60 MG capsule Take 60 mg by mouth every morning.      Marland Kitchen estradiol (VIVELLE-DOT) 0.075 MG/24HR Place 1 patch onto the skin 2 (two) times a week. On Sundays and Wednesdays.      Marland Kitchen ethosuximide (ZARONTIN) 250 MG capsule Take 250-500 mg by mouth 3 (three) times daily. Take 1 tablet every morning, take 2 tablets every afternoon, and take 2 tablets every night at bedtime.      Marland Kitchen levothyroxine (SYNTHROID, LEVOTHROID) 50 MCG  tablet Take 50 mcg by mouth daily.       . nitroGLYCERIN (NITROSTAT) 0.4 MG SL tablet Place 0.4 mg under the tongue every 5 (five) minutes as needed. For chest pain.      . pantoprazole (PROTONIX) 40 MG tablet Take 1 tablet (40 mg total) by mouth every morning.  30 tablet  0  . prasugrel (EFFIENT) 10 MG TABS Take 10 mg by mouth every morning.      . Vitamin D, Ergocalciferol, (DRISDOL) 50000 UNITS CAPS       . XELODA 500 MG tablet       . ZETIA 10 MG tablet         ALLERGIES:  is allergic to aspirin; erythromycin; imdur; lisinopril; niacin and related; penicillins; statins; sulfa drugs cross reactors; and tetracyclines & related.  REVIEW OF SYSTEMS:  The rest of the 14-point review of system was negative.    Filed Vitals:   04/04/12 1151  BP: 150/91  Pulse: 89  Temp: 97.2 F (36.2 C)  Resp: 20   Wt Readings from Last 3 Encounters:  04/04/12 185 lb 1.6 oz (83.961 kg)  04/01/12 187 lb (84.823 kg)  02/29/12 187 lb 1.6 oz (84.868 kg)   ECOG Performance status: 1  PHYSICAL EXAMINATION:   General:  well-nourished woman in no acute distress.  Eyes:  no scleral icterus.  ENT:  There were no oropharyngeal lesions.  Neck was without thyromegaly.  Lymphatics:  Negative cervical, supraclavicular or axillary adenopathy.  Respiratory: lungs were clear bilaterally without wheezing or crackles.  Cardiovascular:  Regular rate and rhythm, S1/S2, without murmur, rub or gallop.  There was no pedal edema.  GI:  abdomen was soft, flat, nontender, nondistended, without organomegaly.  Muscoloskeletal:  no spinal tenderness of palpation of vertebral spine.  Skin exam was without echymosis, petichae.  Neuro exam was nonfocal.  Patient was able to get on and off exam table without assistance.  Gait was normal.  Patient was alerted and oriented.  Attention was good.   Language was appropriate.  Mood was normal without depression.  Speech was not pressured.  Thought content was not tangential.     LABORATORY/RADIOLOGY DATA:  Lab Results  Component Value Date   WBC 6.2 04/04/2012   HGB 13.7 04/04/2012   HCT 40.2 04/04/2012   PLT 289 04/04/2012   GLUCOSE 114* 04/04/2012   CHOL 268* 08/17/2011   TRIG 107 08/17/2011   HDL 82 08/17/2011   LDLCALC 165* 08/17/2011   ALKPHOS 130 04/04/2012   ALT 18 04/04/2012   AST 18 04/04/2012   NA 136 04/04/2012   K 4.6 04/04/2012   CL 102 04/04/2012   CREATININE 0.8 04/04/2012   BUN 15.0 04/04/2012   CO2 24 04/04/2012   INR 1.03 08/17/2011   HGBA1C 5.7* 08/16/2011    ASSESSMENT AND PLAN:   1. Diagnosis: Stage II, normal risk (not high risk) colon cancer. - She is taking her second cycle of Xeloda 2000 mg PO BID.  She has grade 1 fatigue; grade 1 hot/cold flash.  These are not severe  enough for dose reduction.  She would like to continue chemo as previously.  - Long term follow up: CT scan once a year (next one is due by June 2014) . Repeat colonoscopy is due by May 2014.   2.   MGUS: Next SPEP and light chain performed in August 2013. There has been no evidence of progression to active myeloma.   3.  Follow up: since  she is doing well, we will see her every 2 cycles (6 weeks).  She still has every 3 weeks lab to start each new cycle of chemo.   The length of time of the face-to-face encounter was 15 minutes. More than 50% of time was spent counseling and coordination of care.

## 2012-04-04 NOTE — Telephone Encounter (Signed)
gv and printed appt for pt. °

## 2012-04-10 ENCOUNTER — Other Ambulatory Visit: Payer: BC Managed Care – PPO | Admitting: Lab

## 2012-04-10 ENCOUNTER — Ambulatory Visit: Payer: BC Managed Care – PPO | Admitting: Oncology

## 2012-04-11 ENCOUNTER — Ambulatory Visit: Payer: BC Managed Care – PPO | Admitting: Oncology

## 2012-04-11 ENCOUNTER — Other Ambulatory Visit: Payer: BC Managed Care – PPO | Admitting: Lab

## 2012-04-17 ENCOUNTER — Other Ambulatory Visit: Payer: Self-pay | Admitting: Oncology

## 2012-04-17 ENCOUNTER — Other Ambulatory Visit: Payer: Self-pay | Admitting: *Deleted

## 2012-04-17 MED ORDER — CAPECITABINE 500 MG PO TABS: 2000.0000 mg | ORAL_TABLET | Freq: Two times a day (BID) | ORAL | Status: DC

## 2012-04-22 ENCOUNTER — Other Ambulatory Visit: Payer: Self-pay | Admitting: Oncology

## 2012-04-22 ENCOUNTER — Telehealth: Payer: Self-pay | Admitting: *Deleted

## 2012-04-22 DIAGNOSIS — R197 Diarrhea, unspecified: Secondary | ICD-10-CM

## 2012-04-22 MED ORDER — DIPHENOXYLATE-ATROPINE 2.5-0.025 MG PO TABS: 1.0000 | ORAL_TABLET | Freq: Four times a day (QID) | ORAL | Status: DC | PRN

## 2012-04-22 NOTE — Telephone Encounter (Signed)
VM from pt states has had diarrhea alternating w/ constipation and also "severe gas" over this past weekend.   Called pt back for more information and left her a VM to return nurse call.

## 2012-04-22 NOTE — Telephone Encounter (Signed)
Most likely it's not related to Xeloda since she has been on it for 3 months now.  But, just to be safe, hold off on her Xeloda for now.    Have her come in to pick up a container for Cdif test.   She can take Imodium over the counter.  She can also pick up a Lomotil script from [place] in case Imodium by itself doesn't work.  Thankx.

## 2012-04-22 NOTE — Telephone Encounter (Signed)
Pt called back reports did have constipation last week but now having Diarrhea for past 4 days.  Had aprox 5 stools yesterday and 3 already this morning.  She also c/o a lot of gas esp at night.  Has not taken Xeloda yet today.

## 2012-04-22 NOTE — Telephone Encounter (Signed)
Called pt back w/ Dr. Lodema Pilot response.  Instructed her to hold Xeloda for now and come in for collection kit for C-diff.  Instructed to take Imodium OTC for diarrhea as directed on box.  Can p/u Rx for lomotil if imodium does not work.  Pt says will try imodium for now and pick up kit for stool collection.  Informed her will call her w/ results after her stool is tested for Cdiff.  Pt verbalized understanding.

## 2012-04-23 ENCOUNTER — Telehealth: Payer: Self-pay | Admitting: *Deleted

## 2012-04-23 NOTE — Telephone Encounter (Signed)
Received call from pt wanting to let Dr. Gaylyn Rong know that pt felt better today.   Pt stated she would not need any additional medication for diarrhea.

## 2012-04-24 ENCOUNTER — Telehealth: Payer: Self-pay | Admitting: *Deleted

## 2012-04-24 NOTE — Telephone Encounter (Signed)
Pt called to report her diarrhea has resolved from this past weekend.  Pt took 2 imodium on 10/21 and has not had any diarrhea since then.  She continues to take the Xeloda BID,  Did NOT hold any doses (as instructed).  Pt says she did not hear to hold the Xeloda.  She picked up collection kit for stool specimen for C-diff but has not had any stools since 10/21.   I instructed pt to continue on the Xeloda as she has been and to call us back if diarrhea returns.  Instructed to not take any imodium until she obtains stool specimen if diarrhea returns.  Pt verbalized understanding.

## 2012-04-30 ENCOUNTER — Telehealth: Payer: Self-pay | Admitting: *Deleted

## 2012-04-30 DIAGNOSIS — C189 Malignant neoplasm of colon, unspecified: Secondary | ICD-10-CM

## 2012-04-30 NOTE — Telephone Encounter (Signed)
Pt left Vm states very tired x about one week,  "all I do is sleep" and "I can barely do anything."  Asks if her dose of Xeloda can be tweaked? Taking 4 tablets BID, at end of 2nd week of 3rd cycle.  Discussed w/ Clenton Pare, NP would like to check pt's CBC before adjusting dose of Xeloda to r/o anemia or other causes of fatigue.   Called pt and informed of need to check CBC prior to adjusting dose.  She can come in at 8 am tomorrow for CBC.

## 2012-05-01 ENCOUNTER — Telehealth: Payer: Self-pay | Admitting: *Deleted

## 2012-05-01 ENCOUNTER — Other Ambulatory Visit (HOSPITAL_BASED_OUTPATIENT_CLINIC_OR_DEPARTMENT_OTHER): Payer: BC Managed Care – PPO | Admitting: Lab

## 2012-05-01 DIAGNOSIS — C189 Malignant neoplasm of colon, unspecified: Secondary | ICD-10-CM

## 2012-05-01 LAB — CBC WITH DIFFERENTIAL/PLATELET
Basophils Absolute: 0 10*3/uL (ref 0.0–0.1)
HCT: 38.2 % (ref 34.8–46.6)
HGB: 13.2 g/dL (ref 11.6–15.9)
MCH: 34 pg (ref 25.1–34.0)
MONO#: 0.5 10*3/uL (ref 0.1–0.9)
NEUT%: 44.8 % (ref 38.4–76.8)
Platelets: 236 10*3/uL (ref 145–400)
WBC: 8 10*3/uL (ref 3.9–10.3)
lymph#: 3 10*3/uL (ref 0.9–3.3)

## 2012-05-01 NOTE — Telephone Encounter (Signed)
Message copied by Wende Mott on Wed May 01, 2012  4:54 PM ------      Message from: Clenton Pare R      Created: Wed May 01, 2012  1:20 PM       Cameo            Her CBC looks fine - she is not anemic. Please check with Dr Gaylyn Rong as to whether he is okay with dose decrease.            Thanks      [employer]

## 2012-05-01 NOTE — Telephone Encounter (Signed)
Called pt and informed of labs ok, not anemic and Order from Dr. Gaylyn Rong to decrease Xeloda to 3 tabs (1500 mg) twice daily.  Instructed pt to call back on Monday (in 4 or 5 days) if not feeling any better.  She verbalized understanding.

## 2012-05-03 ENCOUNTER — Other Ambulatory Visit: Payer: Self-pay | Admitting: Nurse Practitioner

## 2012-05-03 ENCOUNTER — Telehealth: Payer: Self-pay | Admitting: *Deleted

## 2012-05-03 MED ORDER — PANTOPRAZOLE SODIUM 40 MG PO TBEC: 40.0000 mg | DELAYED_RELEASE_TABLET | Freq: Every morning | ORAL | Status: DC

## 2012-05-03 NOTE — Telephone Encounter (Signed)
PT. IS TAKING XELODA THREE TABS A DAY WITH MEALS. SHE IS ALSO NAUSEATED AND HAS A LOT OF GAS, NO DIARRHEA IN THE LAST 24 HOURS. PT. HAS HAD A GOOD BOWEL MOVEMENT IN THE LAST THREE DAYS. THE MEDICATION LIST HAS PT. TAKEN PROTONIX BUT SHE STATES NOT ON PROTONIX. THIS NOTE TO KRISTIN CURCIO,NP.

## 2012-05-03 NOTE — Telephone Encounter (Signed)
Per Belenda Cruise- advised pt to start Protonix.   Also hold Xeloda until next cycle.  If pt still experiencing symptoms before time to start next cycle, call office.

## 2012-05-06 ENCOUNTER — Other Ambulatory Visit: Payer: Self-pay | Admitting: Oncology

## 2012-05-06 ENCOUNTER — Telehealth: Payer: Self-pay | Admitting: *Deleted

## 2012-05-06 DIAGNOSIS — C189 Malignant neoplasm of colon, unspecified: Secondary | ICD-10-CM

## 2012-05-06 NOTE — Telephone Encounter (Signed)
called and sw pt and gv appt d.t. for 11.8.13 pt aware

## 2012-05-06 NOTE — Telephone Encounter (Signed)
Called pt back and asked if Dr. Bosie Clos is her GI doctor and pt confirmed, she states she forgot.  Gave pt his office number and instructed her to call them for appt asap.  Instructed her to tell them she has vomited blood and we recommend she be seen by them as soon as possible.  She verbalized understanding.

## 2012-05-06 NOTE — Telephone Encounter (Signed)
Pt called states she vomited and had diarrhea after eating [place] sandwich on Saturday night. Had some blood in her vomit about one tablespoon.  She then vomited again and had diarrhea after eating breakfast yesterday morning. No blood in vomit yesterday.  She denies any fevers or ongoing n/v/d except right after eating. She has held her Xeloda as instructed since Friday and also taking Protonix.

## 2012-05-06 NOTE — Telephone Encounter (Signed)
Called pt back w/ Kristin's reply to see GI MD due to hx of ulcers and go to ER if any further episodes of blood w/ vomiting. Also informed plan to see pt here on Friday to assess next cycle of Xeloda.   Pt verbalized understanding.   Says she does not have GI MD,  Last saw a GI doctor over 20 years ago.   Asks if we can give her a referral?

## 2012-05-06 NOTE — Telephone Encounter (Signed)
Message copied by Wende Mott on Mon May 06, 2012 12:04 PM ------      Message from: Clenton Pare R      Created: Mon May 06, 2012 11:22 AM      Regarding: Follow-up       Cameo            I would recommend that she try to get in with GI due to her hx of ulcers.If she has recurrent blood with vomiting, she needs to be seen in the ER.              Also, we need to see her later this week before she is due to begin her next cycle of Xeloda to see if we need to delay this. I will send a POF to scheduling to get her in.                  Belenda Cruise            Pt called states she vomited and had diarrhea after eating [place] sandwich on Saturday night. Had some blood in her vomit about one tablespoon.  She then vomited again and had diarrhea after eating breakfast yesterday morning. No blood in vomit yesterday.  She denies any fevers or ongoing n/v/d except right after eating. She has held her Xeloda as instructed since Friday and also taking Protonix.

## 2012-05-06 NOTE — Telephone Encounter (Signed)
Message copied by Wende Mott on Mon May 06, 2012  1:11 PM ------      Message from: Clenton Pare R      Created: Mon May 06, 2012 12:46 PM      Regarding: GI referral       According to Dr Lodema Pilot note from 12/04/11, Dr Bosie Clos is her GI doctor. He did her last colonoscopy to diagnose her colon ca. Can she call his office to get an appt????

## 2012-05-07 ENCOUNTER — Emergency Department (HOSPITAL_COMMUNITY)
Admission: EM | Admit: 2012-05-07 | Discharge: 2012-05-07 | Disposition: A | Discharge status: Home / Self Care | Payer: BC Managed Care – PPO | Attending: Emergency Medicine | Admitting: Emergency Medicine

## 2012-05-07 ENCOUNTER — Encounter (HOSPITAL_COMMUNITY): Payer: Self-pay | Admitting: Emergency Medicine

## 2012-05-07 DIAGNOSIS — E039 Hypothyroidism, unspecified: Secondary | ICD-10-CM | POA: Insufficient documentation

## 2012-05-07 DIAGNOSIS — F329 Major depressive disorder, single episode, unspecified: Secondary | ICD-10-CM | POA: Insufficient documentation

## 2012-05-07 DIAGNOSIS — Z792 Long term (current) use of antibiotics: Secondary | ICD-10-CM | POA: Insufficient documentation

## 2012-05-07 DIAGNOSIS — F411 Generalized anxiety disorder: Secondary | ICD-10-CM | POA: Insufficient documentation

## 2012-05-07 DIAGNOSIS — E785 Hyperlipidemia, unspecified: Secondary | ICD-10-CM | POA: Insufficient documentation

## 2012-05-07 DIAGNOSIS — K219 Gastro-esophageal reflux disease without esophagitis: Secondary | ICD-10-CM | POA: Insufficient documentation

## 2012-05-07 DIAGNOSIS — Z79899 Other long term (current) drug therapy: Secondary | ICD-10-CM | POA: Insufficient documentation

## 2012-05-07 DIAGNOSIS — Z87891 Personal history of nicotine dependence: Secondary | ICD-10-CM | POA: Insufficient documentation

## 2012-05-07 DIAGNOSIS — C189 Malignant neoplasm of colon, unspecified: Secondary | ICD-10-CM | POA: Insufficient documentation

## 2012-05-07 DIAGNOSIS — M129 Arthropathy, unspecified: Secondary | ICD-10-CM | POA: Insufficient documentation

## 2012-05-07 DIAGNOSIS — K920 Hematemesis: Secondary | ICD-10-CM | POA: Insufficient documentation

## 2012-05-07 DIAGNOSIS — F3289 Other specified depressive episodes: Secondary | ICD-10-CM | POA: Insufficient documentation

## 2012-05-07 DIAGNOSIS — I252 Old myocardial infarction: Secondary | ICD-10-CM | POA: Insufficient documentation

## 2012-05-07 DIAGNOSIS — IMO0001 Reserved for inherently not codable concepts without codable children: Secondary | ICD-10-CM | POA: Insufficient documentation

## 2012-05-07 DIAGNOSIS — I2510 Atherosclerotic heart disease of native coronary artery without angina pectoris: Secondary | ICD-10-CM | POA: Insufficient documentation

## 2012-05-07 DIAGNOSIS — D509 Iron deficiency anemia, unspecified: Secondary | ICD-10-CM | POA: Insufficient documentation

## 2012-05-07 DIAGNOSIS — R197 Diarrhea, unspecified: Secondary | ICD-10-CM | POA: Insufficient documentation

## 2012-05-07 DIAGNOSIS — G40909 Epilepsy, unspecified, not intractable, without status epilepticus: Secondary | ICD-10-CM | POA: Insufficient documentation

## 2012-05-07 LAB — URINALYSIS, MICROSCOPIC ONLY
Hgb urine dipstick: NEGATIVE
Specific Gravity, Urine: 1.031 — ABNORMAL HIGH (ref 1.005–1.030)
pH: 6 (ref 5.0–8.0)

## 2012-05-07 LAB — COMPREHENSIVE METABOLIC PANEL
ALT: 18 U/L (ref 0–35)
Albumin: 3.4 g/dL — ABNORMAL LOW (ref 3.5–5.2)
Alkaline Phosphatase: 97 U/L (ref 39–117)
Chloride: 100 mEq/L (ref 96–112)
GFR calc Af Amer: >90 mL/min (ref >90)
Glucose, Bld: 115 mg/dL — ABNORMAL HIGH (ref 70–99)
Potassium: 3.8 mEq/L (ref 3.5–5.1)
Sodium: 132 mEq/L — ABNORMAL LOW (ref 135–145)
Total Protein: 6.5 g/dL (ref 6.0–8.3)

## 2012-05-07 LAB — CBC WITH DIFFERENTIAL/PLATELET
Eosinophils Absolute: 1 10*3/uL — ABNORMAL HIGH (ref 0.0–0.7)
Lymphs Abs: 2.5 10*3/uL (ref 0.7–4.0)
MCH: 33.7 pg (ref 26.0–34.0)
Neutro Abs: 5.2 10*3/uL (ref 1.7–7.7)
Neutrophils Relative %: 55 % (ref 43–77)
Platelets: 300 10*3/uL (ref 150–400)
RBC: 3.95 MIL/uL (ref 3.87–5.11)
WBC: 9.5 10*3/uL (ref 4.0–10.5)

## 2012-05-07 MED ORDER — SODIUM CHLORIDE 0.9 % IV BOLUS (SEPSIS): 500.0000 mL | Freq: Once | INTRAVENOUS | Status: DC

## 2012-05-07 MED ORDER — ONDANSETRON HCL 4 MG/2ML IJ SOLN
4.0000 mg | Freq: Once | INTRAMUSCULAR | Status: DC
  Filled 2012-05-07: qty 2

## 2012-05-07 MED ORDER — ONDANSETRON 8 MG PO TBDP: 8.0000 mg | ORAL_TABLET | Freq: Three times a day (TID) | ORAL | Status: DC | PRN

## 2012-05-07 NOTE — ED Provider Notes (Signed)
History    CSN: [id number] Arrival date & time 05/07/12  1247 First MD Initiated Contact with Patient 05/07/12 1507     Chief Complaint  Patient presents with  . Abdominal Pain   Patient is a 61 y.o. female presenting with vomiting. The history is provided by the patient.  Emesis  This is a new problem. The current episode started more than 2 days ago (Started saturday). Episode frequency: vomited three times on saturday, once on sunday, no vomiting yesterday. The problem has been gradually improving. The emesis has an appearance of bright red blood. There has been no fever. Associated symptoms include abdominal pain (last week, pain has resolved the last few days) and diarrhea. Pertinent negatives include no fever.  Pt went to see the GI doctor today, Dr Schooler.  He instructed her to come to the ED.  Pt is currently on oral chemo for colon ca.  Past Medical History  Diagnosis Date  . Coronary artery disease     Has had remote NSTEMI with PCI to the RCA; S/P CABG in 2007; LAST CATH WITH MODERATE DISEASE IN A VERY SMALL DIAGONAL BRANCH; SHE IS MANAGED MEDICALLY  . Dyslipidemia   . Seizure disorder   . History of fibromyalgia   . Chest pain   . Monoclonal gammopathy of unknown significance   . Colon cancer 12/04/2011    s/p Laparoscopic-assisted transverse colectomy on 12/19/2011 by Dr. Wakefield.  pT3 N0 M0.   . Hyperlipidemia   . Myocardial infarction 2007  . PONV (postoperative nausea and vomiting)   . Bronchitis     hx of;>6yrs  . Headache   . Seizures 12/06/11    last seizure over 40 yrs ago;takes Tegretol   . Fibromyalgia   . Arthritis     hands   . GERD (gastroesophageal reflux disease)     takes Pantoprazole daily  . Gastric ulcer   . Iron deficiency anemia 11/17/2011  . Hypothyroid     takes Levothyroxine daily  . Depression     takes Cymbalta daily    Past Surgical History  Procedure Date  . Partial hysterectomy 20+yrs ago  . Posterior laminectomy / decompression  cervical spine     20 +yrs ago  . Cardiac catheterization 05/20/2009    obstructive native vessel disease in LAD, RCA, and first diagonal, patent vein graft to distal RCA and LIMA to LAD,normal. ef 60%  . Colonoscopy   . Coronary artery bypass graft 2007    x 2  . Coronary angioplasty     1 stent  . Colon resection 12/19/2011    Procedure: COLON RESECTION LAPAROSCOPIC;  Surgeon: Emelia Loron, MD;  Location: [facility] OR;  Service: General;  Laterality: N/A;  laparoscopic hand assisted partial colon resection    Family History  Problem Relation Age of Onset  . Heart attack Father   . Heart disease Father   . Heart attack Brother 40  . Cancer Mother     lung    History  Substance Use Topics  . Smoking status: Former Smoker -- 0.3 packs/day for 20 years    Types: Cigarettes  . Smokeless tobacco: Never Used     Comment: quit over 66yrs ago  . Alcohol Use: Yes     Comment: occ wine    OB History    Grav Para Term Preterm Abortions TAB SAB Ect Mult Living                  Review of Systems  Constitutional: Negative for fever.  Gastrointestinal: Positive for vomiting, abdominal pain (last week, pain has resolved the last few days) and diarrhea.  All other systems reviewed and are negative.    Allergies  Aspirin; Erythromycin; Imdur; Lisinopril; Niacin and related; Penicillins; Statins; Sulfa drugs cross reactors; and Tetracyclines & related  Home Medications   Current Outpatient Rx  Name  Route  Sig  Dispense  Refill  . CAPECITABINE 500 MG PO TABS   Oral   Take 1,500 mg by mouth 2 (two) times daily after a meal. 3 tabs for 1500 mg dose. Pt takes for 2 weeks then off for 1 week.         . CARBAMAZEPINE ER 100 MG PO TB12   Oral   Take 500 mg by mouth 2 (two) times daily.         Marland Kitchen DIPHENHYDRAMINE HCL 25 MG PO TABS   Oral   Take 25 mg by mouth every 6 (six) hours as needed. For allergies.         Marland Kitchen DIPHENOXYLATE-ATROPINE 2.5-0.025 MG PO TABS   Oral   Take 1  tablet by mouth 4 (four) times daily as needed for diarrhea or loose stools.   30 tablet   0   . DULOXETINE HCL 60 MG PO CPEP   Oral   Take 60 mg by mouth every morning.         Marland Kitchen ESTRADIOL 0.075 MG/24HR TD PTTW   Transdermal   Place 1 patch onto the skin 2 (two) times a week. On Sundays and Wednesdays.         Marland Kitchen ETHOSUXIMIDE 250 MG PO CAPS   Oral   Take 250-500 mg by mouth 3 (three) times daily. Take 1 tablet every morning, take 2 tablets every afternoon, and take 2 tablets every night at bedtime.         Marland Kitchen LEVOTHYROXINE SODIUM 50 MCG PO TABS   Oral   Take 50 mcg by mouth daily.          Marland Kitchen PANTOPRAZOLE SODIUM 40 MG PO TBEC   Oral   Take 1 tablet (40 mg total) by mouth every morning.   30 tablet   0   . PRASUGREL HCL 10 MG PO TABS   Oral   Take 10 mg by mouth every morning.         Marland Kitchen VITAMIN D (ERGOCALCIFEROL) 50000 UNITS PO CAPS   Oral   Take 50,000 Units by mouth See admin instructions. Pt takes twice weekly.  Monday and Wednesday         . ZETIA 10 MG PO TABS   Oral   Take 10 mg by mouth daily.          Marland Kitchen NITROGLYCERIN 0.4 MG SL SUBL   Sublingual   Place 0.4 mg under the tongue every 5 (five) minutes as needed. For chest pain.           BP 129/78  Pulse 88  Temp 97.2 F (36.2 C) (Oral)  Resp 16  SpO2 100%  Physical Exam  Nursing note and vitals reviewed. Constitutional: She appears well-developed and well-nourished. No distress.  HENT:  Head: Normocephalic and atraumatic.  Right Ear: External ear normal.  Left Ear: External ear normal.  Eyes: Conjunctivae normal are normal. Right eye exhibits no discharge. Left eye exhibits no discharge. No scleral icterus.  Neck: Neck supple. No tracheal deviation present.  Cardiovascular: Normal rate, regular rhythm and intact distal pulses.  Pulmonary/Chest: Effort normal and breath sounds normal. No stridor. No respiratory distress. She has no wheezes. She has no rales.  Abdominal: Soft. Bowel  sounds are normal. She exhibits no distension. There is no tenderness. There is no rebound and no guarding.  Musculoskeletal: She exhibits no edema and no tenderness.  Neurological: She is alert. She has normal strength. No sensory deficit. Cranial nerve deficit:  no gross defecits noted. She exhibits normal muscle tone. She displays no seizure activity. Coordination normal.  Skin: Skin is warm and dry. No rash noted.  Psychiatric: She has a normal mood and affect.    ED Course  Procedures (including critical care time)  Labs Reviewed  CBC WITH DIFFERENTIAL - Abnormal; Notable for the following:    RDW 17.5 (*)     Eosinophils Relative 10 (*)     Eosinophils Absolute 1.0 (*)     All other components within normal limits  COMPREHENSIVE METABOLIC PANEL - Abnormal; Notable for the following:    Sodium 132 (*)     Glucose, Bld 115 (*)     Albumin 3.4 (*)     AST 41 (*)     All other components within normal limits  URINALYSIS, MICROSCOPIC ONLY - Abnormal; Notable for the following:    Color, Urine AMBER (*)  BIOCHEMICALS MAY BE AFFECTED BY COLOR   APPearance CLOUDY (*)     Specific Gravity, Urine 1.031 (*)     Bilirubin Urine SMALL (*)     Protein, ur 30 (*)     Leukocytes, UA TRACE (*)     Bacteria, UA FEW (*)     All other components within normal limits  LIPASE, BLOOD  URINE CULTURE   No results found.    MDM  Laboratory tests are reassuring.  No anemia.  She has not had any additional bloody emesis in the last 24 hours. Case discussed with Dr Randa Evens from [facility].  He will contact the office to arrange for close outpt follow up and endoscopy.  Pt is anxious to go home.  Precautions discussed.        Celene Kras, MD 05/07/12 [phone]

## 2012-05-07 NOTE — ED Notes (Signed)
TO ED via private vehicle from Dr's office, (Dr. Bosie Clos) had vomiting on Saturday, bright red bloody looking, Total vomiting - 3 times, bloody looking once- the first  Time. Also vomited Sunday X2, no blood. Diarrhea Saturday, Sunday and today. "like water" - went to private MD today sent here for further eval.

## 2012-05-07 NOTE — ED Notes (Signed)
States has to have Zarontin at 2 pm,.

## 2012-05-09 ENCOUNTER — Telehealth: Payer: Self-pay | Admitting: *Deleted

## 2012-05-09 LAB — URINE CULTURE

## 2012-05-09 NOTE — Telephone Encounter (Signed)
Pt left VM states she is returning nurse's call.  Called pt back and she reports she now is having diarrhea, "everything runs right through me."  She denies any nausea or vomiting today.  Reports did get a stool specimen as ordered by GI doctor which her husband is going to drop off at lab today.  Pt confirms her appt w/ Clenton Pare, NP here tomorrow.

## 2012-05-10 ENCOUNTER — Other Ambulatory Visit: Payer: Self-pay | Admitting: Oncology

## 2012-05-10 ENCOUNTER — Ambulatory Visit (HOSPITAL_BASED_OUTPATIENT_CLINIC_OR_DEPARTMENT_OTHER): Payer: BC Managed Care – PPO | Admitting: Oncology

## 2012-05-10 ENCOUNTER — Encounter: Payer: Self-pay | Admitting: Oncology

## 2012-05-10 ENCOUNTER — Other Ambulatory Visit (HOSPITAL_BASED_OUTPATIENT_CLINIC_OR_DEPARTMENT_OTHER): Payer: BC Managed Care – PPO | Admitting: Lab

## 2012-05-10 ENCOUNTER — Telehealth: Payer: Self-pay | Admitting: Oncology

## 2012-05-10 ENCOUNTER — Ambulatory Visit (HOSPITAL_BASED_OUTPATIENT_CLINIC_OR_DEPARTMENT_OTHER): Payer: BC Managed Care – PPO

## 2012-05-10 ENCOUNTER — Telehealth: Payer: Self-pay

## 2012-05-10 VITALS — BP 145/94 | HR 106 | Temp 96.8°F | Resp 20 | Ht 69.0 in | Wt 179.9 lb

## 2012-05-10 DIAGNOSIS — R110 Nausea: Secondary | ICD-10-CM

## 2012-05-10 DIAGNOSIS — E876 Hypokalemia: Secondary | ICD-10-CM

## 2012-05-10 DIAGNOSIS — R5381 Other malaise: Secondary | ICD-10-CM

## 2012-05-10 DIAGNOSIS — R197 Diarrhea, unspecified: Secondary | ICD-10-CM

## 2012-05-10 DIAGNOSIS — C184 Malignant neoplasm of transverse colon: Secondary | ICD-10-CM

## 2012-05-10 DIAGNOSIS — C189 Malignant neoplasm of colon, unspecified: Secondary | ICD-10-CM

## 2012-05-10 DIAGNOSIS — D472 Monoclonal gammopathy: Secondary | ICD-10-CM

## 2012-05-10 DIAGNOSIS — R6883 Chills (without fever): Secondary | ICD-10-CM

## 2012-05-10 LAB — CBC WITH DIFFERENTIAL/PLATELET
Basophils Absolute: 0 10*3/uL (ref 0.0–0.1)
EOS%: 7.4 % — ABNORMAL HIGH (ref 0.0–7.0)
HCT: 38.4 % (ref 34.8–46.6)
HGB: 13.4 g/dL (ref 11.6–15.9)
MCH: 34.8 pg — ABNORMAL HIGH (ref 25.1–34.0)
MCV: 100 fL (ref 79.5–101.0)
MONO%: 7.8 % (ref 0.0–14.0)
NEUT%: 54.8 % (ref 38.4–76.8)

## 2012-05-10 LAB — COMPREHENSIVE METABOLIC PANEL (CC13)
AST: 22 U/L (ref 5–34)
Alkaline Phosphatase: 116 U/L (ref 40–150)
BUN: 10 mg/dL (ref 7.0–26.0)
Calcium: 8.7 mg/dL (ref 8.4–10.4)
Chloride: 105 mEq/L (ref 98–107)
Creatinine: 0.7 mg/dL (ref 0.6–1.1)

## 2012-05-10 MED ORDER — DIPHENOXYLATE-ATROPINE 2.5-0.025 MG PO TABS: 1.0000 | ORAL_TABLET | Freq: Four times a day (QID) | ORAL | Status: DC | PRN

## 2012-05-10 MED ORDER — SODIUM CHLORIDE 0.9 % IV SOLN
INTRAVENOUS | Status: AC
  Administered 2012-05-10: 11:00:00 via INTRAVENOUS

## 2012-05-10 MED ORDER — POTASSIUM CHLORIDE CRYS ER 20 MEQ PO TBCR: 40.0000 meq | EXTENDED_RELEASE_TABLET | Freq: Two times a day (BID) | ORAL | Status: DC

## 2012-05-10 NOTE — Progress Notes (Signed)
[facility]  Telephone:([phone] [phone]   OFFICE PROGRESS NOTE   Cc:  PICKETT,KATHERINE, PA  DIAGNOSIS AND PAST THERAPY:  1. MGUS: Watchful observation.  2. Newly diagnosed pT3 N0 M0 transverse colon adenocarcinoma. S/p laparoscopic-assisted transverse colectomy by Dr. Dwain Sarna on 12/19/2011.   CURRENT THERAPY: started adjuvant Xeloda 2,000mg  PO BID in 02/2012. Dose was reduced to 1500 mg BID on 04/30/12 due to fatigue.   INTERVAL HISTORY: Rachel Clark 60 y.o. female returns for regular follow up with her husband. She took her last dose of Xeloda on 05/03/12 and was told to stop one day early due to diarrhea and abdominal pain. She had vomiting over this past weekend and noted bright red blood in her vomitus. She has not been vomiting since, but has nausea. She has been having diarrhea and states food runs through her. She is able to drink liquids. She was seen by Dr Bosie Clos earlier this week and had an upper endoscopy per her report was normal. Husband states that additional blood work and stool samples are pending. She was told not to take Imodium per GI pending results of studies. She denied fever, mucositis, skin rash, chest pain.    Past Medical History  Diagnosis Date  . Coronary artery disease     Has had remote NSTEMI with PCI to the RCA; S/P CABG in 2007; LAST CATH WITH MODERATE DISEASE IN A VERY SMALL DIAGONAL BRANCH; SHE IS MANAGED MEDICALLY  . Dyslipidemia   . Seizure disorder   . History of fibromyalgia   . Chest pain   . Monoclonal gammopathy of unknown significance   . Colon cancer 12/04/2011    s/p Laparoscopic-assisted transverse colectomy on 12/19/2011 by Dr. Dwain Sarna.  pT3 N0 M0.   Marland Kitchen Hyperlipidemia   . Myocardial infarction 2007  . PONV (postoperative nausea and vomiting)   . Bronchitis     hx of;>51yrs  . Headache   . Seizures 12/06/11    last seizure over 40 yrs ago;takes Tegretol   . Fibromyalgia   . Arthritis     hands     . GERD (gastroesophageal reflux disease)     takes Pantoprazole daily  . Gastric ulcer   . Iron deficiency anemia 11/17/2011  . Hypothyroid     takes Levothyroxine daily  . Depression     takes Cymbalta daily  . Diarrhea 05/10/2012  . Diarrhea 05/10/2012    Past Surgical History  Procedure Date  . Partial hysterectomy 20+yrs ago  . Posterior laminectomy / decompression cervical spine     20+yrs ago  . Cardiac catheterization 05/20/2009    obstructive native vessel disease in LAD, RCA, and first diagonal, patent vein graft to distal RCA and LIMA to LAD,normal. ef 60%  . Colonoscopy   . Coronary artery bypass graft 2007    x 2  . Coronary angioplasty     1 stent  . Colon resection 12/19/2011    Procedure: COLON RESECTION LAPAROSCOPIC;  Surgeon: Emelia Loron, MD;  Location: [facility] OR;  Service: General;  Laterality: N/A;  laparoscopic hand assisted partial colon resection    Current Outpatient Prescriptions  Medication Sig Dispense Refill  . capecitabine (XELODA) 500 MG tablet Take 1,500 mg by mouth 2 (two) times daily after a meal. 3 tabs for 1500 mg dose. Pt takes for 2 weeks then off for 1 week.      . carbamazepine (TEGRETOL XR) 100 MG 12 hr tablet Take 500 mg by mouth 2 (  two) times daily.      . diphenhydrAMINE (BENADRYL) 25 MG tablet Take 25 mg by mouth every 6 (six) hours as needed. For allergies.      . diphenoxylate-atropine (LOMOTIL) 2.5-0.025 MG per tablet Take 1 tablet by mouth 4 (four) times daily as needed for diarrhea or loose stools.  30 tablet  0  . diphenoxylate-atropine (LOMOTIL) 2.5-0.025 MG per tablet Take 1 tablet by mouth 4 (four) times daily as needed for diarrhea or loose stools.  30 tablet  0  . DULoxetine (CYMBALTA) 60 MG capsule Take 60 mg by mouth every morning.      Marland Kitchen estradiol (VIVELLE-DOT) 0.075 MG/24HR Place 1 patch onto the skin 2 (two) times a week. On Sundays and Wednesdays.      Marland Kitchen ethosuximide (ZARONTIN) 250 MG capsule Take 250-500 mg by mouth 3  (three) times daily. Take 1 tablet every morning, take 2 tablets every afternoon, and take 2 tablets every night at bedtime.      Marland Kitchen levothyroxine (SYNTHROID, LEVOTHROID) 50 MCG tablet Take 50 mcg by mouth daily.       . nitroGLYCERIN (NITROSTAT) 0.4 MG SL tablet Place 0.4 mg under the tongue every 5 (five) minutes as needed. For chest pain.      Marland Kitchen ondansetron (ZOFRAN ODT) 8 MG disintegrating tablet Take 1 tablet (8 mg total) by mouth every 8 (eight) hours as needed for nausea.  20 tablet  0  . pantoprazole (PROTONIX) 40 MG tablet Take 1 tablet (40 mg total) by mouth every morning.  30 tablet  0  . potassium chloride SA (K-DUR,KLOR-CON) 20 MEQ tablet Take 2 tablets (40 mEq total) by mouth 2 (two) times daily.  80 tablet  3  . prasugrel (EFFIENT) 10 MG TABS Take 10 mg by mouth every morning.      . Vitamin D, Ergocalciferol, (DRISDOL) 50000 UNITS CAPS Take 50,000 Units by mouth See admin instructions. Pt takes twice weekly.  Monday and Wednesday      . ZETIA 10 MG tablet Take 10 mg by mouth daily.        Current Facility-Administered Medications  Medication Dose Route Frequency Provider Last Rate Last Dose  . [EXPIRED] 0.9 %  sodium chloride infusion   Intravenous Continuous Myrtis Ser, NP 1,000 mL/hr at 05/10/12 1128      ALLERGIES:  is allergic to aspirin; erythromycin; imdur; lisinopril; niacin and related; penicillins; statins; sulfa drugs cross reactors; and tetracyclines & related.  REVIEW OF SYSTEMS:  The rest of the 14-point review of system was negative.   Filed Vitals:   05/10/12 1032  BP: 145/94  Pulse: 106  Temp: 96.8 F (36 C)  Resp: 20   Wt Readings from Last 3 Encounters:  05/10/12 179 lb 14.4 oz (81.602 kg)  04/04/12 185 lb 1.6 oz (83.961 kg)  04/01/12 187 lb (84.823 kg)   ECOG Performance status: 1  PHYSICAL EXAMINATION:   General:  well-nourished woman in no acute distress. Tired appearing today. Eyes:  no scleral icterus.  ENT:  There were no oropharyngeal  lesions.  Neck was without thyromegaly.  Lymphatics:  Negative cervical, supraclavicular or axillary adenopathy.  Respiratory: lungs were clear bilaterally without wheezing or crackles.  Cardiovascular:  Regular rate and rhythm, S1/S2, without murmur, rub or gallop.  There was no pedal edema.  GI:  abdomen was soft, flat, nontender, nondistended, without organomegaly.  Muscoloskeletal:  no spinal tenderness of palpation of vertebral spine.  Skin exam was without echymosis, petichae.  Neuro exam was nonfocal.  Patient was able to get on and off exam table without assistance.  Gait was normal.  Patient was alerted and oriented.  Attention was good.   Language was appropriate.  Mood was normal without depression.  Speech was not pressured.  Thought content was not tangential.     LABORATORY/RADIOLOGY DATA:  Lab Results  Component Value Date   WBC 7.6 05/10/2012   HGB 13.4 05/10/2012   HCT 38.4 05/10/2012   PLT 295 05/10/2012   GLUCOSE 121* 05/10/2012   CHOL 268* 08/17/2011   TRIG 107 08/17/2011   HDL 82 08/17/2011   LDLCALC 165* 08/17/2011   ALKPHOS 116 05/10/2012   ALT 23 05/10/2012   AST 22 05/10/2012   NA 139 05/10/2012   K 2.8 Repeated and Verified* 05/10/2012   CL 105 05/10/2012   CREATININE 0.7 05/10/2012   BUN 10.0 05/10/2012   CO2 27 05/10/2012   INR 1.03 08/17/2011   HGBA1C 5.7* 08/16/2011    ASSESSMENT AND PLAN:   1. Diagnosis: Stage II, normal risk (not high risk) colon cancer. - She is s/p three cycles of of Xeloda 2000 mg PO BID; dose was reduced to 1500 mg BID last week.  She has grade 1-2 fatigue; grade 1 hot/cold flash, grade 3 diarrhea, and grade 3 nasuea.  She has been off the Xeloda for 1 week and symptoms are not improving. I advised her not to restart her Xeloda as scheduled this weekend. Will give her IVF daily to prevent dehydration. Stool cultures are pending per GI and I have ordered a STAT stool for c diff today. Discussed with Dr Gaylyn Rong and due to the profuse diarrhea, she may use  Imodium.  - Long term follow up: CT scan once a year (next one is due by June 2014) . Repeat colonoscopy is due by May 2014.   2. Hypokalemia, due to diarrhea: K+ level is 2.8 today. She was given a prescription for KDur 40 Meq BID and will received 40 Meq of KCL in IV over the weekend. Repeat BMET on Monday 05/13/12.  3. Diarrhea, Due to chemotherapy versus infection: She has been of chemo for about 1 week and diarrhea is less likely due to the Xeloda. Stool cultures are pending. Instructions regarding Imodium use given.  4.   MGUS: Next SPEP and light chain performed in August 2013. There has been no evidence of progression to active myeloma.   5.  Follow up: 05/27/12 as scheduled or sooner if needed. She will not restart Xeloda until given further instructions at her next visit.  The length of time of the face-to-face encounter was 30 minutes. More than 50% of time was spent counseling and coordination of care.

## 2012-05-10 NOTE — ED Notes (Signed)
+   Urine Chart sent to EDP office for review. 

## 2012-05-10 NOTE — Telephone Encounter (Signed)
s.w. pt and gv ivf d/t .Marland Kitchen..pt aware

## 2012-05-10 NOTE — Telephone Encounter (Signed)
Per POF I have scheduled appts. TMB 

## 2012-05-11 ENCOUNTER — Ambulatory Visit (HOSPITAL_BASED_OUTPATIENT_CLINIC_OR_DEPARTMENT_OTHER): Payer: BC Managed Care – PPO

## 2012-05-11 VITALS — BP 130/85 | HR 90 | Temp 97.5°F | Resp 16

## 2012-05-11 DIAGNOSIS — C184 Malignant neoplasm of transverse colon: Secondary | ICD-10-CM

## 2012-05-11 DIAGNOSIS — R197 Diarrhea, unspecified: Secondary | ICD-10-CM

## 2012-05-11 DIAGNOSIS — E876 Hypokalemia: Secondary | ICD-10-CM

## 2012-05-11 MED ORDER — SODIUM CHLORIDE 0.9 % IV SOLN
INTRAVENOUS | Status: DC
  Administered 2012-05-11: 08:00:00 via INTRAVENOUS

## 2012-05-11 NOTE — Patient Instructions (Addendum)
[facility] Discharge Instructions for Patients Receiving Chemotherapy  Today you received the following chemotherapy agents :  IVF  To help prevent nausea and vomiting after your treatment, we encourage you to take your nausea medication as instructed by your physician.    If you develop nausea and vomiting that is not controlled by your nausea medication, call the clinic. If it is after clinic hours your family physician or the after hours number for the clinic or go to the Emergency Department.   BELOW ARE SYMPTOMS THAT SHOULD BE REPORTED IMMEDIATELY:  *FEVER GREATER THAN 100.5 F  *CHILLS WITH OR WITHOUT FEVER  NAUSEA AND VOMITING THAT IS NOT CONTROLLED WITH YOUR NAUSEA MEDICATION  *UNUSUAL SHORTNESS OF BREATH  *UNUSUAL BRUISING OR BLEEDING  TENDERNESS IN MOUTH AND THROAT WITH OR WITHOUT PRESENCE OF ULCERS  *URINARY PROBLEMS  *BOWEL PROBLEMS  UNUSUAL RASH Items with * indicate a potential emergency and should be followed up as soon as possible.  One of the nurses will contact you 24 hours after your treatment. Please let the nurse know about any problems that you may have experienced. Feel free to call the clinic you have any questions or concerns. The clinic phone number is [phone].   I have been informed and understand all the instructions given to me. I know to contact the clinic, my physician, or go to the Emergency Department if any problems should occur. I do not have any questions at this time, but understand that I may call the clinic during office hours   should I have any questions or need assistance in obtaining follow up care.    __________________________________________  _____________  __________ Signature of Patient or Authorized Representative            Date                   Time    __________________________________________ Nurse's Signature    

## 2012-05-11 NOTE — ED Notes (Signed)
Chart returned from EDP office. Per [facility] PA-C, Likely contaminant. Only 20,000 colonies found. No antibiotic treatment necessary at this time.

## 2012-05-13 ENCOUNTER — Other Ambulatory Visit: Payer: Self-pay | Admitting: Oncology

## 2012-05-13 ENCOUNTER — Other Ambulatory Visit: Payer: BC Managed Care – PPO

## 2012-05-13 ENCOUNTER — Ambulatory Visit (HOSPITAL_BASED_OUTPATIENT_CLINIC_OR_DEPARTMENT_OTHER): Payer: BC Managed Care – PPO

## 2012-05-13 ENCOUNTER — Ambulatory Visit (HOSPITAL_BASED_OUTPATIENT_CLINIC_OR_DEPARTMENT_OTHER): Payer: BC Managed Care – PPO | Admitting: Lab

## 2012-05-13 DIAGNOSIS — E876 Hypokalemia: Secondary | ICD-10-CM

## 2012-05-13 DIAGNOSIS — C184 Malignant neoplasm of transverse colon: Secondary | ICD-10-CM

## 2012-05-13 DIAGNOSIS — R197 Diarrhea, unspecified: Secondary | ICD-10-CM

## 2012-05-13 DIAGNOSIS — R110 Nausea: Secondary | ICD-10-CM

## 2012-05-13 DIAGNOSIS — E860 Dehydration: Secondary | ICD-10-CM

## 2012-05-13 LAB — BASIC METABOLIC PANEL (CC13)
BUN: 8 mg/dL (ref 7.0–26.0)
Chloride: 107 mEq/L (ref 98–107)
Potassium: 3.5 mEq/L (ref 3.5–5.1)
Sodium: 139 mEq/L (ref 136–145)

## 2012-05-13 MED ORDER — SODIUM CHLORIDE 0.9 % IV SOLN
Freq: Once | INTRAVENOUS | Status: AC
  Administered 2012-05-13: 10:00:00 via INTRAVENOUS

## 2012-05-13 NOTE — Patient Instructions (Signed)
Patient aware of next appointment; discharged home with no complaints. 

## 2012-05-14 ENCOUNTER — Ambulatory Visit: Payer: BC Managed Care – PPO

## 2012-05-22 NOTE — Progress Notes (Signed)
Received results of Upper GI Andoscopy from [facility]; forwarded to Dr. Gaylyn Rong.

## 2012-05-27 ENCOUNTER — Telehealth: Payer: Self-pay | Admitting: Oncology

## 2012-05-27 ENCOUNTER — Other Ambulatory Visit (INDEPENDENT_AMBULATORY_CARE_PROVIDER_SITE_OTHER): Payer: Self-pay | Admitting: General Surgery

## 2012-05-27 ENCOUNTER — Ambulatory Visit (HOSPITAL_BASED_OUTPATIENT_CLINIC_OR_DEPARTMENT_OTHER): Payer: BC Managed Care – PPO | Admitting: Oncology

## 2012-05-27 ENCOUNTER — Other Ambulatory Visit: Payer: Self-pay | Admitting: Oncology

## 2012-05-27 ENCOUNTER — Telehealth: Payer: Self-pay | Admitting: *Deleted

## 2012-05-27 ENCOUNTER — Other Ambulatory Visit (HOSPITAL_BASED_OUTPATIENT_CLINIC_OR_DEPARTMENT_OTHER): Payer: BC Managed Care – PPO | Admitting: Lab

## 2012-05-27 VITALS — BP 143/89 | HR 90 | Temp 97.0°F | Resp 18 | Ht 69.0 in | Wt 181.6 lb

## 2012-05-27 DIAGNOSIS — D472 Monoclonal gammopathy: Secondary | ICD-10-CM

## 2012-05-27 DIAGNOSIS — C189 Malignant neoplasm of colon, unspecified: Secondary | ICD-10-CM

## 2012-05-27 DIAGNOSIS — C184 Malignant neoplasm of transverse colon: Secondary | ICD-10-CM

## 2012-05-27 LAB — CBC WITH DIFFERENTIAL/PLATELET
Basophils Absolute: 0 10*3/uL (ref 0.0–0.1)
Eosinophils Absolute: 0.4 10*3/uL (ref 0.0–0.5)
HCT: 41.9 % (ref 34.8–46.6)
HGB: 13.8 g/dL (ref 11.6–15.9)
LYMPH%: 29.9 % (ref 14.0–49.7)
MCV: 100.3 fL (ref 79.5–101.0)
MONO%: 7.9 % (ref 0.0–14.0)
NEUT#: 3.7 10*3/uL (ref 1.5–6.5)
NEUT%: 55.6 % (ref 38.4–76.8)
Platelets: 230 10*3/uL (ref 145–400)
RBC: 4.18 10*6/uL (ref 3.70–5.45)

## 2012-05-27 LAB — COMPREHENSIVE METABOLIC PANEL (CC13)
Alkaline Phosphatase: 150 U/L (ref 40–150)
BUN: 16 mg/dL (ref 7.0–26.0)
Creatinine: 0.7 mg/dL (ref 0.6–1.1)
Glucose: 112 mg/dl — ABNORMAL HIGH (ref 70–99)
Total Bilirubin: 0.32 mg/dL (ref 0.20–1.20)

## 2012-05-27 NOTE — Progress Notes (Signed)
[facility]  Telephone:(336) [phone] Fax:([phone]) [phone]   OFFICE PROGRESS NOTE   Cc:  PICKETT,KATHERINE, PA  DIAGNOSIS AND PAST THERAPY:  1. MGUS: Watchful observation.  2. Newly diagnosed pT3 N0 M0 transverse colon adenocarcinoma. S/p laparoscopic-assisted transverse colectomy by Dr. Dwain Sarna on 12/19/2011.   CURRENT THERAPY: started adjuvant Xeloda 2,000mg  PO BID in 02/2012.   INTERVAL HISTORY: Rachel Clark 61 y.o. female returns for regular follow up by herself.  She had severe diarrhea with 1,500mg  PO BID and also with 1,000mg  PO BID of Xeloda.  Extensive work up by Dr. Bosie Clos was negative including Cdif, endoscopy.  She has been off of Xeloda since 05/04/2012.  Imodium worked for her, and she took for about 2-3 days after last dose of Xeloda.  She has not had diarrhea since.  She may have intermittent lose stool once every few days.  She had abdominal cramp with diarrhea which has completely resolved.    Patient denies fever, anorexia, weight loss, fatigue, headache, visual changes, confusion, drenching night sweats, palpable lymph node swelling, mucositis, odynophagia, dysphagia, nausea vomiting, jaundice, chest pain, palpitation, shortness of breath, dyspnea on exertion, productive cough, gum bleeding, epistaxis, hematemesis, hemoptysis, abdominal pain, abdominal swelling, early satiety, melena, hematochezia, hematuria, skin rash, spontaneous bleeding, joint swelling, joint pain, heat or cold intolerance, bowel bladder incontinence, back pain, focal motor weakness, paresthesia, depression, suicidal or homicidal ideation, feeling hopelessness.    Past Medical History  Diagnosis Date  . Coronary artery disease     Has had remote NSTEMI with PCI to the RCA; S/P CABG in 2007; LAST CATH WITH MODERATE DISEASE IN A VERY SMALL DIAGONAL BRANCH; SHE IS MANAGED MEDICALLY  . Dyslipidemia   . Seizure disorder   . History of fibromyalgia   . Chest pain   . Monoclonal  gammopathy of unknown significance   . Colon cancer 12/04/2011    s/p Laparoscopic-assisted transverse colectomy on 12/19/2011 by Dr. Dwain Sarna.  pT3 N0 M0.   Marland Kitchen Hyperlipidemia   . Myocardial infarction 2007  . PONV (postoperative nausea and vomiting)   . Bronchitis     hx of;>75yrs  . Headache   . Seizures 12/06/11    last seizure over 40 yrs ago;takes Tegretol   . Fibromyalgia   . Arthritis     hands   . GERD (gastroesophageal reflux disease)     takes Pantoprazole daily  . Gastric ulcer   . Iron deficiency anemia 11/17/2011  . Hypothyroid     takes Levothyroxine daily  . Depression     takes Cymbalta daily  . Diarrhea 05/10/2012  . Diarrhea 05/10/2012    Past Surgical History  Procedure Date  . Partial hysterectomy 20+yrs ago  . Posterior laminectomy / decompression cervical spine     20+yrs ago  . Cardiac catheterization 05/20/2009    obstructive native vessel disease in LAD, RCA, and first diagonal, patent vein graft to distal RCA and LIMA to LAD,normal. ef 60%  . Colonoscopy   . Coronary artery bypass graft 2007    x 2  . Coronary angioplasty     1 stent  . Colon resection 12/19/2011    Procedure: COLON RESECTION LAPAROSCOPIC;  Surgeon: Emelia Loron, MD;  Location: [facility] OR;  Service: General;  Laterality: N/A;  laparoscopic hand assisted partial colon resection    Current Outpatient Prescriptions  Medication Sig Dispense Refill  . carbamazepine (TEGRETOL XR) 100 MG 12 hr tablet Take 500 mg by mouth 2 (two) times  daily.      . diphenhydrAMINE (BENADRYL) 25 MG tablet Take 25 mg by mouth every 6 (six) hours as needed. For allergies.      . diphenoxylate-atropine (LOMOTIL) 2.5-0.025 MG per tablet Take 1 tablet by mouth 4 (four) times daily as needed for diarrhea or loose stools.  30 tablet  0  . diphenoxylate-atropine (LOMOTIL) 2.5-0.025 MG per tablet Take 1 tablet by mouth 4 (four) times daily as needed for diarrhea or loose stools.  30 tablet  0  . DULoxetine (CYMBALTA) 60  MG capsule Take 60 mg by mouth every morning.      Marland Kitchen estradiol (VIVELLE-DOT) 0.075 MG/24HR Place 1 patch onto the skin 2 (two) times a week. On Sundays and Wednesdays.      Marland Kitchen ethosuximide (ZARONTIN) 250 MG capsule Take 250-500 mg by mouth 3 (three) times daily. Take 1 tablet every morning, take 2 tablets every afternoon, and take 2 tablets every night at bedtime.      Marland Kitchen levothyroxine (SYNTHROID, LEVOTHROID) 50 MCG tablet Take 50 mcg by mouth daily.       . nitroGLYCERIN (NITROSTAT) 0.4 MG SL tablet Place 0.4 mg under the tongue every 5 (five) minutes as needed. For chest pain.      Marland Kitchen ondansetron (ZOFRAN ODT) 8 MG disintegrating tablet Take 1 tablet (8 mg total) by mouth every 8 (eight) hours as needed for nausea.  20 tablet  0  . pantoprazole (PROTONIX) 40 MG tablet Take 1 tablet (40 mg total) by mouth every morning.  30 tablet  0  . potassium chloride SA (K-DUR,KLOR-CON) 20 MEQ tablet Take 2 tablets (40 mEq total) by mouth 2 (two) times daily.  80 tablet  3  . prasugrel (EFFIENT) 10 MG TABS Take 10 mg by mouth every morning.      . Vitamin D, Ergocalciferol, (DRISDOL) 50000 UNITS CAPS Take 50,000 Units by mouth See admin instructions. Pt takes twice weekly.  Monday and Wednesday      . ZETIA 10 MG tablet Take 10 mg by mouth daily.       . capecitabine (XELODA) 500 MG tablet Take 1,500 mg by mouth 2 (two) times daily after a meal. 3 tabs for 1500 mg dose. Pt takes for 2 weeks then off for 1 week.        ALLERGIES:  is allergic to aspirin; erythromycin; imdur; lisinopril; niacin and related; penicillins; statins; sulfa drugs cross reactors; and tetracyclines & related.  REVIEW OF SYSTEMS:  The rest of the 14-point review of system was negative.   Filed Vitals:   05/27/12 0907  BP: 143/89  Pulse: 90  Temp: 97 F (36.1 C)  Resp: 18   Wt Readings from Last 3 Encounters:  05/27/12 181 lb 9.6 oz (82.373 kg)  05/10/12 179 lb 14.4 oz (81.602 kg)  04/04/12 185 lb 1.6 oz (83.961 kg)   ECOG  Performance status: 1  PHYSICAL EXAMINATION:   General:  well-nourished woman in no acute distress.  Eyes:  no scleral icterus.  ENT:  There were no oropharyngeal lesions.  Neck was without thyromegaly.  Lymphatics:  Negative cervical, supraclavicular or axillary adenopathy.  Respiratory: lungs were clear bilaterally without wheezing or crackles.  Cardiovascular:  Regular rate and rhythm, S1/S2, without murmur, rub or gallop.  There was no pedal edema.  GI:  abdomen was soft, flat, nontender, nondistended, without organomegaly.  Muscoloskeletal:  no spinal tenderness of palpation of vertebral spine.  Skin exam was without echymosis, petichae.  Neuro exam  was nonfocal.  Patient was able to get on and off exam table without assistance.  Gait was normal.  Patient was alerted and oriented.  Attention was good.   Language was appropriate.  Mood was normal without depression.  Speech was not pressured.  Thought content was not tangential.     LABORATORY/RADIOLOGY DATA:  Lab Results  Component Value Date   WBC 6.6 05/27/2012   HGB 13.8 05/27/2012   HCT 41.9 05/27/2012   PLT 230 05/27/2012   GLUCOSE 112* 05/27/2012   CHOL 268* 08/17/2011   TRIG 107 08/17/2011   HDL 82 08/17/2011   LDLCALC 165* 08/17/2011   ALKPHOS 150 05/27/2012   ALT 25 05/27/2012   AST 16 05/27/2012   NA 134* 05/27/2012   K 4.3 05/27/2012   CL 107 05/27/2012   CREATININE 0.7 05/27/2012   BUN 16.0 05/27/2012   CO2 26 05/27/2012   INR 1.03 08/17/2011   HGBA1C 5.7* 08/16/2011    ASSESSMENT AND PLAN:   1. Diagnosis: Stage II, normal risk (not high risk) colon cancer. -Treatment:  Adjuvant Xeloda.  Diarrhea:  Could be related to Xeloda. - Options:  *  Stop chemo all together since she only had stage II colon cancer.   *  Lower dose of Xeloda to 500 mg PO daily.  *  Switch to IV chemo 5FU.    Ms. Beckers decided to try IV 5FU since she is concerned that she may not have benefit from such low dose of Xeloda.  I advised her  that there may be still diarrhea with IV 5FU but the risk of slightly less than Xeloda.  While awaiting portacath placement by Dr. Dwain Sarna, she wanted to restart Xeloda at 500mg  by mouth twice daily since she is very concerned that she has been off of chemo for quite a while.  5FU will be given as both bolus and 48 hour infusion along with rescue Leucovorin every 2 weeks.  Plan is for 4 months as she already had about 2 months of adjuvant Xeloda.  4.  Follow up:  In about 2 weeks to start 5FU.  Return visit in about 1 month.   2.   MGUS: SPEP and light chains were stable in Aug 2013. There is no evidence of progression to active myeloma. Will continue observation.  Next myeloma panel should be checked in Feb 2014 unless there are concerning signs for active myeloma.   3.  Follow up: restart adjuvant chemo with IV 5FU on 06/10/2012.  I'll see her on 06/24/2012 before the 2nd cycle.   The length of time of the face-to-face encounter was 25 minutes. More than 50% of time was spent counseling and coordination of care.

## 2012-05-27 NOTE — Patient Instructions (Addendum)
1.  Diagnosis:  Stage II colon cancer. 2.  Treatment:  Adjuvant Xeloda.  Diarrhea:  Could be related to Xeloda. 3.  Options:  *  Stop chemo all together.    *  Lower dose of Xeloda.  *  Switch to IV chemo 5FU.    Rachel Clark decided to try IV 5FU.  While awaiting portacath placement by Dr. Dwain Sarna, she can restart Xeloda at 500mg  by mouth twice daily.  4.  Follow up:  In about 2 weeks to start 5FU.  Return visit in about 1 month.

## 2012-05-27 NOTE — Telephone Encounter (Signed)
Per staff message and POF I have scheduled appts.  JMW  

## 2012-05-27 NOTE — Telephone Encounter (Signed)
s.w. pt and advised her about the chemo education class .Marland Kitchen..pt aware

## 2012-05-27 NOTE — Telephone Encounter (Signed)
gv and printed appt schedule for pt for Dec....emailed michelle to add chemo...will call pt with time

## 2012-05-27 NOTE — Telephone Encounter (Signed)
s.w. pt and gv appt schedule for Dec. for chemo

## 2012-05-28 ENCOUNTER — Telehealth (INDEPENDENT_AMBULATORY_CARE_PROVIDER_SITE_OTHER): Payer: Self-pay | Admitting: General Surgery

## 2012-05-28 NOTE — Telephone Encounter (Signed)
LMOM asking pt to return my call.  This is so that I can inform her she has an appt with [place] on 12/2 at 1:30 and that I have sent her surgery orders around to surgery scheduling.  She will need to stop asp 5 days before hand.

## 2012-05-29 ENCOUNTER — Encounter (HOSPITAL_COMMUNITY): Payer: Self-pay | Admitting: Pharmacy Technician

## 2012-05-29 NOTE — Telephone Encounter (Signed)
Called pt and informed her of the message.

## 2012-06-03 ENCOUNTER — Encounter (HOSPITAL_COMMUNITY): Payer: Self-pay | Admitting: *Deleted

## 2012-06-03 ENCOUNTER — Telehealth: Payer: Self-pay | Admitting: *Deleted

## 2012-06-03 ENCOUNTER — Ambulatory Visit (INDEPENDENT_AMBULATORY_CARE_PROVIDER_SITE_OTHER): Payer: BC Managed Care – PPO | Admitting: General Surgery

## 2012-06-03 ENCOUNTER — Encounter (INDEPENDENT_AMBULATORY_CARE_PROVIDER_SITE_OTHER): Payer: Self-pay | Admitting: General Surgery

## 2012-06-03 VITALS — BP 138/86 | HR 80 | Temp 96.9°F | Resp 16 | Ht 69.0 in | Wt 184.2 lb

## 2012-06-03 DIAGNOSIS — C189 Malignant neoplasm of colon, unspecified: Secondary | ICD-10-CM

## 2012-06-03 MED ORDER — CIPROFLOXACIN IN D5W 400 MG/200ML IV SOLN
400.0000 mg | INTRAVENOUS | Status: DC
  Filled 2012-06-03: qty 200

## 2012-06-03 NOTE — Telephone Encounter (Signed)
s.w. pt and advised pt that chemo class changed to 12.5..Marland KitchenMarland KitchenMarland Kitchenpt aware

## 2012-06-03 NOTE — Telephone Encounter (Signed)
s.w. pt and advised pt that chemo class changed to 12.5.....pt aware °

## 2012-06-03 NOTE — Telephone Encounter (Signed)
Pt had called to r/s chemo class tomorrow due to having Porta Cath placed tomorrow by Dr. Dwain Sarna.  POF sent to scheduling.

## 2012-06-03 NOTE — Progress Notes (Signed)
Patient ID: Rachel Clark, female   DOB: 01/12/1951, 61 y.o.   MRN: [id number]  Chief Complaint  Patient presents with  . Routine Post Op    eval for PAC placement    HPI Rachel Clark is a 61 y.o. female.   HPI This is a 61-year-old female that I know well from a laparoscopic transverse colectomy for a T3 N0 colon cancer. She did well from this. She returns today after her she was begun on chemotherapy and she developed significant diarrhea on her oral chemotherapy. This has resolved and she is doing much better right now. She and Dr. Ha have decided to proceed with intravenous chemotherapy right now. He requested that I see her for discussion of placement of a Port-A-Cath. She comes in today without any significant complaints. Past Medical History  Diagnosis Date  . Coronary artery disease     Has had remote NSTEMI with PCI to the RCA; S/P CABG in 2007; LAST CATH WITH MODERATE DISEASE IN A VERY SMALL DIAGONAL BRANCH; SHE IS MANAGED MEDICALLY  . Dyslipidemia   . Seizure disorder   . History of fibromyalgia   . Chest pain   . Monoclonal gammopathy of unknown significance   . Colon cancer 12/04/2011    s/p Laparoscopic-assisted transverse colectomy on 12/19/2011 by Dr. Tayron Hunnell.  pT3 N0 M0.   . Hyperlipidemia   . Myocardial infarction 2007  . PONV (postoperative nausea and vomiting)   . Bronchitis     hx of;>6yrs  . Headache   . Seizures 12/06/11    last seizure over 40 yrs ago;takes Tegretol   . Fibromyalgia   . Arthritis     hands   . GERD (gastroesophageal reflux disease)     takes Pantoprazole daily  . Gastric ulcer   . Iron deficiency anemia 11/17/2011  . Hypothyroid     takes Levothyroxine daily  . Depression     takes Cymbalta daily  . Diarrhea 05/10/2012  . Diarrhea 05/10/2012    Past Surgical History  Procedure Date  . Partial hysterectomy 20+yrs ago  . Posterior laminectomy / decompression cervical spine     20+yrs ago  . Cardiac catheterization 05/20/2009      obstructive native vessel disease in LAD, RCA, and first diagonal, patent vein graft to distal RCA and LIMA to LAD,normal. ef 60%  . Colonoscopy   . Coronary artery bypass graft 2007    x 2  . Coronary angioplasty     1 stent  . Colon resection 12/19/2011    Procedure: COLON RESECTION LAPAROSCOPIC;  Surgeon: Edelmiro Innocent, MD;  Location: [facility] OR;  Service: General;  Laterality: N/A;  laparoscopic hand assisted partial colon resection    Family History  Problem Relation Age of Onset  . Heart attack Father   . Heart disease Father   . Heart attack Brother 40  . Cancer Mother     lung    Social History History  Substance Use Topics  . Smoking status: Former Smoker -- 0.3 packs/day for 20 years    Types: Cigarettes  . Smokeless tobacco: Never Used     Comment: quit over 20yrs ago  . Alcohol Use: Yes     Comment: occ wine    Allergies  Allergen Reactions  . Aspirin Other (See Comments)    GI upset  . Erythromycin     Gi upset  . Imdur (Isosorbide Mononitrate)     Pt does not recall reaction  . Lisinopril       cough  . Niacin And Related     Pt does not recall reactions   . Penicillins     Gi upset  . Statins     Muscle pain  . Sulfa Drugs Cross Reactors Swelling  . Tetracyclines & Related     Current Outpatient Prescriptions  Medication Sig Dispense Refill  . capecitabine (XELODA) 500 MG tablet Take 500 mg by mouth 2 (two) times daily after a meal. Pt takes for 2 weeks then off for 1 week.      . carbamazepine (TEGRETOL XR) 100 MG 12 hr tablet Take 100-200 mg by mouth 3 (three) times daily. Take 1 cap in the morning, 2 cap at lunch, and 2 cap in the evening      . diphenhydrAMINE (BENADRYL) 25 MG tablet Take 25 mg by mouth every 6 (six) hours as needed. For allergies.      . diphenoxylate-atropine (LOMOTIL) 2.5-0.025 MG per tablet Take 1 tablet by mouth 4 (four) times daily as needed for diarrhea or loose stools.  30 tablet  0  . DULoxetine (CYMBALTA) 60 MG  capsule Take 60 mg by mouth every morning.      . estradiol (VIVELLE-DOT) 0.075 MG/24HR Place 1 patch onto the skin 2 (two) times a week. On Sundays and Wednesdays.      . ethosuximide (ZARONTIN) 250 MG capsule Take 250-500 mg by mouth 3 (three) times daily. Take 1 tablet every morning, take 2 tablets every afternoon, and take 2 tablets every night at bedtime.      . ezetimibe (ZETIA) 10 MG tablet Take 10 mg by mouth at bedtime.      . levothyroxine (SYNTHROID, LEVOTHROID) 50 MCG tablet Take 50 mcg by mouth daily.       . nitroGLYCERIN (NITROSTAT) 0.4 MG SL tablet Place 0.4 mg under the tongue every 5 (five) minutes as needed. For chest pain.      . ondansetron (ZOFRAN ODT) 8 MG disintegrating tablet Take 1 tablet (8 mg total) by mouth every 8 (eight) hours as needed for nausea.  20 tablet  0  . pantoprazole (PROTONIX) 40 MG tablet Take 1 tablet (40 mg total) by mouth every morning.  30 tablet  0  . potassium chloride SA (K-DUR,KLOR-CON) 20 MEQ tablet Take 2 tablets (40 mEq total) by mouth 2 (two) times daily.  80 tablet  3  . Vitamin D, Ergocalciferol, (DRISDOL) 50000 UNITS CAPS Take 50,000 Units by mouth 2 (two) times a week. Pt takes twice weekly.  Monday and Wednesday      . prasugrel (EFFIENT) 10 MG TABS Take 10 mg by mouth every morning.        Review of Systems Review of Systems  Blood pressure 138/86, pulse 80, temperature 96.9 F (36.1 C), temperature source Temporal, resp. rate 16, height 5' 9" (1.753 m), weight 184 lb 3.2 oz (83.553 kg).  Physical Exam Physical Exam  Vitals reviewed. Constitutional: She appears well-developed and well-nourished.  Cardiovascular: Normal rate, regular rhythm and normal heart sounds.   Pulmonary/Chest: Effort normal and breath sounds normal. She has no wheezes. She has no rales.    Abdominal:    Lymphadenopathy:    She has no cervical adenopathy.      Assessment   T3N0 colon cancer    Plan    She is due to begin intravenous  chemotherapy due to her intolerance of xeloda. I discussed Port-A-Cath placement with her today. We discussed a subclavian placement. I showed her the port   and I will would appear on her. We discussed the risks including bleeding, infection, pneumothorax. We are going to plan on doing this tomorrow to begin her chemotherapy on December 9. I answered all of her and her husband's questions.       Elexa Kivi 06/03/2012, 1:53 PM    

## 2012-06-04 ENCOUNTER — Encounter (HOSPITAL_COMMUNITY): Payer: Self-pay | Admitting: *Deleted

## 2012-06-04 ENCOUNTER — Other Ambulatory Visit: Payer: BC Managed Care – PPO

## 2012-06-04 ENCOUNTER — Ambulatory Visit (HOSPITAL_COMMUNITY): Payer: BC Managed Care – PPO

## 2012-06-04 ENCOUNTER — Encounter (HOSPITAL_COMMUNITY)
Admission: RE | Disposition: A | Discharge status: Home / Self Care | Payer: Self-pay | Source: Ambulatory Visit | Attending: General Surgery

## 2012-06-04 ENCOUNTER — Encounter (HOSPITAL_COMMUNITY): Payer: Self-pay | Admitting: Certified Registered"

## 2012-06-04 ENCOUNTER — Ambulatory Visit (HOSPITAL_COMMUNITY): Payer: BC Managed Care – PPO | Admitting: Certified Registered"

## 2012-06-04 ENCOUNTER — Ambulatory Visit (HOSPITAL_COMMUNITY)
Admission: RE | Admit: 2012-06-04 | Discharge: 2012-06-04 | Disposition: A | Discharge status: Home / Self Care | Payer: BC Managed Care – PPO | Source: Ambulatory Visit | Attending: General Surgery | Admitting: General Surgery

## 2012-06-04 DIAGNOSIS — C189 Malignant neoplasm of colon, unspecified: Secondary | ICD-10-CM

## 2012-06-04 HISTORY — PX: PORTACATH PLACEMENT: SHX2246

## 2012-06-04 LAB — SURGICAL PCR SCREEN
MRSA, PCR: NEGATIVE
Staphylococcus aureus: NEGATIVE

## 2012-06-04 SURGERY — INSERTION, TUNNELED CENTRAL VENOUS DEVICE, WITH PORT
Anesthesia: General | Site: Chest | Wound class: Clean

## 2012-06-04 MED ORDER — HEPARIN SOD (PORK) LOCK FLUSH 100 UNIT/ML IV SOLN
INTRAVENOUS | Status: DC | PRN
  Administered 2012-06-04: 500 [IU]

## 2012-06-04 MED ORDER — MUPIROCIN 2 % EX OINT
TOPICAL_OINTMENT | Freq: Two times a day (BID) | CUTANEOUS | Status: DC
  Administered 2012-06-04: 07:00:00 via NASAL

## 2012-06-04 MED ORDER — PROPOFOL 10 MG/ML IV BOLUS
INTRAVENOUS | Status: DC | PRN
  Administered 2012-06-04: 150 mg via INTRAVENOUS

## 2012-06-04 MED ORDER — FENTANYL CITRATE 0.05 MG/ML IJ SOLN
INTRAMUSCULAR | Status: DC | PRN
  Administered 2012-06-04: 100 ug via INTRAVENOUS

## 2012-06-04 MED ORDER — LACTATED RINGERS IV SOLN
INTRAVENOUS | Status: DC | PRN
  Administered 2012-06-04: 07:00:00 via INTRAVENOUS

## 2012-06-04 MED ORDER — HEPARIN SOD (PORK) LOCK FLUSH 100 UNIT/ML IV SOLN
INTRAVENOUS | Status: AC
  Filled 2012-06-04: qty 5

## 2012-06-04 MED ORDER — SODIUM CHLORIDE 0.9 % IR SOLN
Status: DC | PRN
  Administered 2012-06-04: 07:00:00

## 2012-06-04 MED ORDER — LIDOCAINE HCL (CARDIAC) 20 MG/ML IV SOLN
INTRAVENOUS | Status: DC | PRN
  Administered 2012-06-04: 100 mg via INTRAVENOUS

## 2012-06-04 MED ORDER — HYDROCODONE-ACETAMINOPHEN 5-325 MG PO TABS
ORAL_TABLET | ORAL | Status: AC
  Administered 2012-06-04: 2
  Filled 2012-06-04: qty 2

## 2012-06-04 MED ORDER — MUPIROCIN 2 % EX OINT
TOPICAL_OINTMENT | CUTANEOUS | Status: AC
  Filled 2012-06-04: qty 22

## 2012-06-04 MED ORDER — BUPIVACAINE HCL (PF) 0.25 % IJ SOLN
INTRAMUSCULAR | Status: DC | PRN
  Administered 2012-06-04: 9 mL

## 2012-06-04 MED ORDER — HYDROCODONE-ACETAMINOPHEN 10-325 MG PO TABS: 1.0000 | ORAL_TABLET | Freq: Four times a day (QID) | ORAL | Status: DC | PRN

## 2012-06-04 MED ORDER — BUPIVACAINE HCL (PF) 0.25 % IJ SOLN
INTRAMUSCULAR | Status: AC
  Filled 2012-06-04: qty 30

## 2012-06-04 MED ORDER — MIDAZOLAM HCL 5 MG/5ML IJ SOLN
INTRAMUSCULAR | Status: DC | PRN
  Administered 2012-06-04: 2 mg via INTRAVENOUS

## 2012-06-04 SURGICAL SUPPLY — 52 items
ADH SKN CLS APL DERMABOND .7 (GAUZE/BANDAGES/DRESSINGS) ×1
ADH SKN CLS LQ APL DERMABOND (GAUZE/BANDAGES/DRESSINGS) ×1
BAG DECANTER FOR FLEXI CONT (MISCELLANEOUS) ×2 IMPLANT
BLADE SURG 11 STRL SS (BLADE) ×2 IMPLANT
BLADE SURG 15 STRL LF DISP TIS (BLADE) ×1 IMPLANT
BLADE SURG 15 STRL SS (BLADE) ×2
CHLORAPREP W/TINT 26ML (MISCELLANEOUS) ×2 IMPLANT
CLOTH BEACON ORANGE TIMEOUT ST (SAFETY) ×2 IMPLANT
COVER SURGICAL LIGHT HANDLE (MISCELLANEOUS) ×2 IMPLANT
CRADLE DONUT ADULT HEAD (MISCELLANEOUS) ×2 IMPLANT
DECANTER SPIKE VIAL GLASS SM (MISCELLANEOUS) ×2 IMPLANT
DERMABOND ADHESIVE PROPEN (GAUZE/BANDAGES/DRESSINGS) ×1
DERMABOND ADVANCED (GAUZE/BANDAGES/DRESSINGS) ×1
DERMABOND ADVANCED .7 DNX12 (GAUZE/BANDAGES/DRESSINGS) ×1 IMPLANT
DERMABOND ADVANCED .7 DNX6 (GAUZE/BANDAGES/DRESSINGS) IMPLANT
DRAPE C-ARM 42X72 X-RAY (DRAPES) ×2 IMPLANT
DRAPE LAPAROSCOPIC ABDOMINAL (DRAPES) ×2 IMPLANT
ELECT CAUTERY BLADE 6.4 (BLADE) ×2 IMPLANT
ELECT REM PT RETURN 9FT ADLT (ELECTROSURGICAL) ×2
ELECTRODE REM PT RTRN 9FT ADLT (ELECTROSURGICAL) ×1 IMPLANT
GAUZE SPONGE 4X4 16PLY XRAY LF (GAUZE/BANDAGES/DRESSINGS) ×2 IMPLANT
GLOVE BIO SURGEON STRL SZ7 (GLOVE) ×2 IMPLANT
GLOVE BIOGEL PI IND STRL 7.5 (GLOVE) ×1 IMPLANT
GLOVE BIOGEL PI INDICATOR 7.5 (GLOVE) ×1
GOWN STRL NON-REIN LRG LVL3 (GOWN DISPOSABLE) ×4 IMPLANT
INTRODUCER COOK 11FR (CATHETERS) IMPLANT
KIT BASIN OR (CUSTOM PROCEDURE TRAY) ×2 IMPLANT
KIT PORT POWER 9.6FR MRI PREA (Catheter) IMPLANT
KIT PORT POWER ISP 8FR (Catheter) ×1 IMPLANT
KIT POWER CATH 8FR (Catheter) ×1 IMPLANT
KIT ROOM TURNOVER OR (KITS) ×2 IMPLANT
NDL HYPO 25GX1X1/2 BEV (NEEDLE) ×1 IMPLANT
NEEDLE HYPO 25GX1X1/2 BEV (NEEDLE) ×2 IMPLANT
NS IRRIG 1000ML POUR BTL (IV SOLUTION) ×2 IMPLANT
PACK SURGICAL SETUP 50X90 (CUSTOM PROCEDURE TRAY) ×2 IMPLANT
PAD ARMBOARD 7.5X6 YLW CONV (MISCELLANEOUS) ×4 IMPLANT
PENCIL BUTTON HOLSTER BLD 10FT (ELECTRODE) ×2 IMPLANT
SET INTRODUCER 12FR PACEMAKER (SHEATH) IMPLANT
SET SHEATH INTRODUCER 10FR (MISCELLANEOUS) IMPLANT
SHEATH COOK PEEL AWAY SET 9F (SHEATH) IMPLANT
SUT MNCRL AB 4-0 PS2 18 (SUTURE) ×2 IMPLANT
SUT PROLENE 2 0 SH 30 (SUTURE) ×2 IMPLANT
SUT SILK 2 0 (SUTURE)
SUT SILK 2-0 18XBRD TIE 12 (SUTURE) IMPLANT
SUT VIC AB 3-0 SH 27 (SUTURE) ×2
SUT VIC AB 3-0 SH 27XBRD (SUTURE) ×1 IMPLANT
SYR 20ML ECCENTRIC (SYRINGE) ×4 IMPLANT
SYR 5ML LUER SLIP (SYRINGE) ×2 IMPLANT
SYR CONTROL 10ML LL (SYRINGE) IMPLANT
TOWEL OR 17X24 6PK STRL BLUE (TOWEL DISPOSABLE) ×2 IMPLANT
TOWEL OR 17X26 10 PK STRL BLUE (TOWEL DISPOSABLE) ×2 IMPLANT
WATER STERILE IRR 1000ML POUR (IV SOLUTION) IMPLANT

## 2012-06-04 NOTE — Interval H&P Note (Signed)
History and Physical Interval Note:  06/04/2012 7:28 AM  Rachel Clark  has presented today for surgery, with the diagnosis of colon cancer  The various methods of treatment have been discussed with the patient and family. After consideration of risks, benefits and other options for treatment, the patient has consented to  Procedure(s) (LRB) with comments: INSERTION PORT-A-CATH (N/A) - Insertion of port-a-cath  as a surgical intervention .  The patient's history has been reviewed, patient examined, no change in status, stable for surgery.  I have reviewed the patient's chart and labs.  Questions were answered to the patient's satisfaction.     Ed Mandich

## 2012-06-04 NOTE — Anesthesia Procedure Notes (Signed)
Procedure Name: LMA Insertion Date/Time: 06/04/2012 7:49 AM Performed by: Arlice Colt B Pre-anesthesia Checklist: Patient identified, Emergency Drugs available, Suction available, Patient being monitored and Timeout performed Patient Re-evaluated:Patient Re-evaluated prior to inductionOxygen Delivery Method: Circle system utilized Preoxygenation: Pre-oxygenation with 100% oxygen Intubation Type: IV induction LMA: LMA inserted LMA Size: 4.0 Number of attempts: 1 Placement Confirmation: positive ETCO2 and breath sounds checked- equal and bilateral Tube secured with: Tape Dental Injury: Teeth and Oropharynx as per pre-operative assessment

## 2012-06-04 NOTE — Op Note (Signed)
Preoperative diagnosis: Colon cancer, need for venous access for chemotherapy Postoperative diagnoses: Same as above Procedure: Left subclavian power port insertion Surgeon: Dr. Harden Mo Anesthesia: Gen. With LMA Complications: None Estimated blood loss: Minimal Drains: None Disposition to recovery room in stable condition Specimens none Sponge needle count correct x2 at operation  Indications: This is a 61 year old female I did a laparoscopic transverse colectomy on for a T3N0 colon cancer. She elected to attempt oral chemotherapy but this was complicated by diarrhea. She is now scheduled to begin intravenous chemotherapy on December 9 and been asked to place a port for access during that time. The patient, her husband, and I discussed the risks and benefits of port placement prior to beginning.  Procedure: After informed consent was obtained the patient was taken to the operating room. She was administered ciprofloxacin due to her multiple allergies. She had sequential compression devices on her legs. She was then placed under general anesthesia with an LMA. An axillary roll placed. Her arms were tucked appropriately padded. Her bilateral chest were then prepped and draped in the standard sterile surgical fashion. Surgical time was performed.  She was placed in Trendelenburg position. I then infiltrated Marcaine throughout the region of the left chest. I then accessed the left subclavian vein on the first pass. The wire was placed. This was confirmed by fluoroscopy to be in good position. I then made a pocket below this overlying the pectoralis muscle. I then tunneled a line between the 2 sites. I then dilated up the tract. I then placed the dilator and under direct vision followed the dilator all the way in. I then removed the wire assembly. I then placed the line through the peel-away sheath. The peel-away sheath was removed. The line was backed out to be in the superior vena cava. I then  attached this to the port. The port was sutured into position with 2-0 Prolene in 3 places. This aspirated blood and flushed easily. I packed this with heparin. Final fluoroscopy showed there to be no kinks in the line, port in good position, and the line of the distal cava. I then closes with 3-0 Vicryl and 4-0 Monocryl. Dermabond was placed. She tolerated this well as extubated and transferred to recovery stable.

## 2012-06-04 NOTE — Anesthesia Postprocedure Evaluation (Signed)
Anesthesia Post Note  Patient: Rachel Clark  Procedure(s) Performed: Procedure(s) (LRB): INSERTION PORT-A-CATH (N/A)  Anesthesia type: general  Patient location: PACU  Post pain: Pain level controlled  Post assessment: Patient's Cardiovascular Status Stable  Last Vitals:  Filed Vitals:   06/04/12 0940  BP: 143/83  Pulse: 74  Temp: 36.3 C  Resp: 20    Post vital signs: Reviewed and stable  Level of consciousness: sedated  Complications: No apparent anesthesia complications

## 2012-06-04 NOTE — Anesthesia Preprocedure Evaluation (Addendum)
Anesthesia Evaluation  Patient identified by MRN, date of birth, ID band Patient awake    Reviewed: Allergy & Precautions, H&P , NPO status , Patient's Chart, lab work & pertinent test results  History of Anesthesia Complications (+) PONV  Airway Mallampati: I TM Distance: >3 FB Neck ROM: Full    Dental  (+) Teeth Intact   Pulmonary          Cardiovascular + CAD, + Past MI, + Cardiac Stents and + CABG     Neuro/Psych  Headaches, Seizures - (remote history),  Depression    GI/Hepatic PUD, GERD-  Medicated and Controlled,  Endo/Other  Hypothyroidism   Renal/GU      Musculoskeletal  (+) Fibromyalgia -  Abdominal   Peds  Hematology   Anesthesia Other Findings   Reproductive/Obstetrics                          Anesthesia Physical Anesthesia Plan  ASA: III  Anesthesia Plan: General   Post-op Pain Management:    Induction: Intravenous  Airway Management Planned: LMA  Additional Equipment:   Intra-op Plan:   Post-operative Plan: Extubation in OR  Informed Consent: I have reviewed the patients History and Physical, chart, labs and discussed the procedure including the risks, benefits and alternatives for the proposed anesthesia with the patient or authorized representative who has indicated his/her understanding and acceptance.   Dental advisory given  Plan Discussed with: Anesthesiologist and Surgeon  Anesthesia Plan Comments:         Anesthesia Quick Evaluation

## 2012-06-04 NOTE — H&P (View-Only) (Signed)
Patient ID: Rachel Clark, female   DOB: [birth year], 61 y.o.   MRN: [id number]  Chief Complaint  Patient presents with  . Routine Post Op    eval for PAC placement    HPI Rachel Clark is a 61 y.o. female.   HPI This is a 61 year old female that I know well from a laparoscopic transverse colectomy for a T3 N0 colon cancer. She did well from this. She returns today after her she was begun on chemotherapy and she developed significant diarrhea on her oral chemotherapy. This has resolved and she is doing much better right now. She and Dr. Gaylyn Rong have decided to proceed with intravenous chemotherapy right now. He requested that I see her for discussion of placement of a Port-A-Cath. She comes in today without any significant complaints. Past Medical History  Diagnosis Date  . Coronary artery disease     Has had remote NSTEMI with PCI to the RCA; S/P CABG in 2007; LAST CATH WITH MODERATE DISEASE IN A VERY SMALL DIAGONAL BRANCH; SHE IS MANAGED MEDICALLY  . Dyslipidemia   . Seizure disorder   . History of fibromyalgia   . Chest pain   . Monoclonal gammopathy of unknown significance   . Colon cancer 12/04/2011    s/p Laparoscopic-assisted transverse colectomy on 12/19/2011 by Dr. Dwain Sarna.  pT3 N0 M0.   Marland Kitchen Hyperlipidemia   . Myocardial infarction 2007  . PONV (postoperative nausea and vomiting)   . Bronchitis     hx of;>40yrs  . Headache   . Seizures 12/06/11    last seizure over 40 yrs ago;takes Tegretol   . Fibromyalgia   . Arthritis     hands   . GERD (gastroesophageal reflux disease)     takes Pantoprazole daily  . Gastric ulcer   . Iron deficiency anemia 11/17/2011  . Hypothyroid     takes Levothyroxine daily  . Depression     takes Cymbalta daily  . Diarrhea 05/10/2012  . Diarrhea 05/10/2012    Past Surgical History  Procedure Date  . Partial hysterectomy 20+yrs ago  . Posterior laminectomy / decompression cervical spine     20+yrs ago  . Cardiac catheterization 05/20/2009      obstructive native vessel disease in LAD, RCA, and first diagonal, patent vein graft to distal RCA and LIMA to LAD,normal. ef 60%  . Colonoscopy   . Coronary artery bypass graft 2007    x 2  . Coronary angioplasty     1 stent  . Colon resection 12/19/2011    Procedure: COLON RESECTION LAPAROSCOPIC;  Surgeon: Emelia Loron, MD;  Location: [facility];  Service: General;  Laterality: N/A;  laparoscopic hand assisted partial colon resection    Family History  Problem Relation Age of Onset  . Heart attack Father   . Heart disease Father   . Heart attack Brother 40  . Cancer Mother     lung    Social History History  Substance Use Topics  . Smoking status: Former Smoker -- 0.3 packs/day for 20 years    Types: Cigarettes  . Smokeless tobacco: Never Used     Comment: quit over 68yrs ago  . Alcohol Use: Yes     Comment: occ wine    Allergies  Allergen Reactions  . Aspirin Other (See Comments)    GI upset  . Erythromycin     Gi upset  . Imdur (Isosorbide Mononitrate)     Pt does not recall reaction  . Lisinopril  cough  . Niacin And Related     Pt does not recall reactions   . Penicillins     Gi upset  . Statins     Muscle pain  . Sulfa Drugs Cross Reactors Swelling  . Tetracyclines & Related     Current Outpatient Prescriptions  Medication Sig Dispense Refill  . capecitabine (XELODA) 500 MG tablet Take 500 mg by mouth 2 (two) times daily after a meal. Pt takes for 2 weeks then off for 1 week.      . carbamazepine (TEGRETOL XR) 100 MG 12 hr tablet Take 100-200 mg by mouth 3 (three) times daily. Take 1 cap in the morning, 2 cap at lunch, and 2 cap in the evening      . diphenhydrAMINE (BENADRYL) 25 MG tablet Take 25 mg by mouth every 6 (six) hours as needed. For allergies.      . diphenoxylate-atropine (LOMOTIL) 2.5-0.025 MG per tablet Take 1 tablet by mouth 4 (four) times daily as needed for diarrhea or loose stools.  30 tablet  0  . DULoxetine (CYMBALTA) 60 MG  capsule Take 60 mg by mouth every morning.      Marland Kitchen estradiol (VIVELLE-DOT) 0.075 MG/24HR Place 1 patch onto the skin 2 (two) times a week. On Sundays and Wednesdays.      Marland Kitchen ethosuximide (ZARONTIN) 250 MG capsule Take 250-500 mg by mouth 3 (three) times daily. Take 1 tablet every morning, take 2 tablets every afternoon, and take 2 tablets every night at bedtime.      Marland Kitchen ezetimibe (ZETIA) 10 MG tablet Take 10 mg by mouth at bedtime.      Marland Kitchen levothyroxine (SYNTHROID, LEVOTHROID) 50 MCG tablet Take 50 mcg by mouth daily.       . nitroGLYCERIN (NITROSTAT) 0.4 MG SL tablet Place 0.4 mg under the tongue every 5 (five) minutes as needed. For chest pain.      Marland Kitchen ondansetron (ZOFRAN ODT) 8 MG disintegrating tablet Take 1 tablet (8 mg total) by mouth every 8 (eight) hours as needed for nausea.  20 tablet  0  . pantoprazole (PROTONIX) 40 MG tablet Take 1 tablet (40 mg total) by mouth every morning.  30 tablet  0  . potassium chloride SA (K-DUR,KLOR-CON) 20 MEQ tablet Take 2 tablets (40 mEq total) by mouth 2 (two) times daily.  80 tablet  3  . Vitamin D, Ergocalciferol, (DRISDOL) 50000 UNITS CAPS Take 50,000 Units by mouth 2 (two) times a week. Pt takes twice weekly.  Monday and Wednesday      . prasugrel (EFFIENT) 10 MG TABS Take 10 mg by mouth every morning.        Review of Systems Review of Systems  Blood pressure 138/86, pulse 80, temperature 96.9 F (36.1 C), temperature source Temporal, resp. rate 16, height 5\' 9"  (1.753 m), weight 184 lb 3.2 oz (83.553 kg).  Physical Exam Physical Exam  Vitals reviewed. Constitutional: She appears well-developed and well-nourished.  Cardiovascular: Normal rate, regular rhythm and normal heart sounds.   Pulmonary/Chest: Effort normal and breath sounds normal. She has no wheezes. She has no rales.    Abdominal:    Lymphadenopathy:    She has no cervical adenopathy.      Assessment   T3N0 colon cancer    Plan    She is due to begin intravenous  chemotherapy due to her intolerance of xeloda. I discussed Port-A-Cath placement with her today. We discussed a subclavian placement. I showed her the port  and I will would appear on her. We discussed the risks including bleeding, infection, pneumothorax. We are going to plan on doing this tomorrow to begin her chemotherapy on December 9. I answered all of her and her husband's questions.       Nirel Babler 06/03/2012, 1:53 PM

## 2012-06-04 NOTE — Progress Notes (Signed)
Iv removed from right hand;site wnl and cath intact

## 2012-06-04 NOTE — Transfer of Care (Signed)
Immediate Anesthesia Transfer of Care Note  Patient: Rachel Clark  Procedure(s) Performed: Procedure(s) (LRB) with comments: INSERTION PORT-A-CATH (N/A) - Insertion of port-a-cath   Patient Location: PACU  Anesthesia Type:General  Level of Consciousness: responds to stimulation  Airway & Oxygen Therapy: Patient Spontanous Breathing  Post-op Assessment: Report given to PACU RN and Post -op Vital signs reviewed and stable  Post vital signs: Reviewed and stable  Complications: No apparent anesthesia complications

## 2012-06-04 NOTE — Progress Notes (Signed)
pcxr done as ordered

## 2012-06-05 ENCOUNTER — Encounter (HOSPITAL_COMMUNITY): Payer: Self-pay | Admitting: General Surgery

## 2012-06-06 ENCOUNTER — Encounter: Payer: Self-pay | Admitting: *Deleted

## 2012-06-06 ENCOUNTER — Other Ambulatory Visit: Payer: BC Managed Care – PPO

## 2012-06-06 ENCOUNTER — Other Ambulatory Visit: Payer: Self-pay | Admitting: *Deleted

## 2012-06-06 MED ORDER — LIDOCAINE-PRILOCAINE 2.5-2.5 % EX CREA: TOPICAL_CREAM | CUTANEOUS | Status: DC | PRN

## 2012-06-10 ENCOUNTER — Other Ambulatory Visit (HOSPITAL_BASED_OUTPATIENT_CLINIC_OR_DEPARTMENT_OTHER): Payer: BC Managed Care – PPO | Admitting: Lab

## 2012-06-10 ENCOUNTER — Ambulatory Visit (HOSPITAL_BASED_OUTPATIENT_CLINIC_OR_DEPARTMENT_OTHER): Payer: BC Managed Care – PPO

## 2012-06-10 VITALS — BP 148/99 | HR 66 | Temp 97.4°F | Resp 16

## 2012-06-10 DIAGNOSIS — C189 Malignant neoplasm of colon, unspecified: Secondary | ICD-10-CM

## 2012-06-10 DIAGNOSIS — C184 Malignant neoplasm of transverse colon: Secondary | ICD-10-CM

## 2012-06-10 DIAGNOSIS — Z5111 Encounter for antineoplastic chemotherapy: Secondary | ICD-10-CM

## 2012-06-10 LAB — COMPREHENSIVE METABOLIC PANEL (CC13)
AST: 13 U/L (ref 5–34)
Alkaline Phosphatase: 104 U/L (ref 40–150)
BUN: 15 mg/dL (ref 7.0–26.0)
Glucose: 90 mg/dl (ref 70–99)
Sodium: 137 mEq/L (ref 136–145)
Total Bilirubin: 0.33 mg/dL (ref 0.20–1.20)
Total Protein: 6.7 g/dL (ref 6.4–8.3)

## 2012-06-10 LAB — CBC WITH DIFFERENTIAL/PLATELET
EOS%: 5.3 % (ref 0.0–7.0)
Eosinophils Absolute: 0.3 10*3/uL (ref 0.0–0.5)
LYMPH%: 35.1 % (ref 14.0–49.7)
MCH: 32.9 pg (ref 25.1–34.0)
MCV: 97 fL (ref 79.5–101.0)
MONO%: 7.2 % (ref 0.0–14.0)
NEUT#: 3 10*3/uL (ref 1.5–6.5)
Platelets: 289 10*3/uL (ref 145–400)
RBC: 3.98 10*6/uL (ref 3.70–5.45)

## 2012-06-10 MED ORDER — FLUOROURACIL CHEMO INJECTION 2.5 GM/50ML
300.0000 mg/m2 | Freq: Once | INTRAVENOUS | Status: AC
  Administered 2012-06-10: 600 mg via INTRAVENOUS
  Filled 2012-06-10: qty 12

## 2012-06-10 MED ORDER — SODIUM CHLORIDE 0.9 % IV SOLN
1800.0000 mg/m2 | INTRAVENOUS | Status: DC
  Administered 2012-06-10: 3600 mg via INTRAVENOUS
  Filled 2012-06-10: qty 72

## 2012-06-10 MED ORDER — SODIUM CHLORIDE 0.9 % IJ SOLN
10.0000 mL | INTRAMUSCULAR | Status: DC | PRN
  Filled 2012-06-10: qty 10

## 2012-06-10 MED ORDER — LEUCOVORIN CALCIUM INJECTION 350 MG
300.0000 mg/m2 | Freq: Once | INTRAVENOUS | Status: AC
  Administered 2012-06-10: 600 mg via INTRAVENOUS
  Filled 2012-06-10: qty 30

## 2012-06-10 MED ORDER — SODIUM CHLORIDE 0.9 % IV SOLN
Freq: Once | INTRAVENOUS | Status: AC
  Administered 2012-06-10: 10:00:00 via INTRAVENOUS

## 2012-06-10 MED ORDER — HEPARIN SOD (PORK) LOCK FLUSH 100 UNIT/ML IV SOLN
500.0000 [IU] | Freq: Once | INTRAVENOUS | Status: DC | PRN
  Filled 2012-06-10: qty 5

## 2012-06-10 NOTE — Patient Instructions (Addendum)
[facility] Discharge Instructions for Patients Receiving Chemotherapy  Today you received the following chemotherapy agents: leucovorin, 70fu  To help prevent nausea and vomiting after your treatment, we encourage you to take your nausea medication.  Take it as often as prescribed.    If you develop nausea and vomiting that is not controlled by your nausea medication, call the clinic. If it is after clinic hours your family physician or the after hours number for the clinic or go to the Emergency Department.   BELOW ARE SYMPTOMS THAT SHOULD BE REPORTED IMMEDIATELY:  *FEVER GREATER THAN 100.5 F  *CHILLS WITH OR WITHOUT FEVER  NAUSEA AND VOMITING THAT IS NOT CONTROLLED WITH YOUR NAUSEA MEDICATION  *UNUSUAL SHORTNESS OF BREATH  *UNUSUAL BRUISING OR BLEEDING  TENDERNESS IN MOUTH AND THROAT WITH OR WITHOUT PRESENCE OF ULCERS  *URINARY PROBLEMS  *BOWEL PROBLEMS  UNUSUAL RASH Items with * indicate a potential emergency and should be followed up as soon as possible.  One of the nurses will contact you 24 hours after your treatment. Please let the nurse know about any problems that you may have experienced. Feel free to call the clinic you have any questions or concerns. The clinic phone number is [phone].   I have been informed and understand all the instructions given to me. I know to contact the clinic, my physician, or go to the Emergency Department if any problems should occur. I do not have any questions at this time, but understand that I may call the clinic during office hours   should I have any questions or need assistance in obtaining follow up care.    __________________________________________  _____________  __________ Signature of Patient or Authorized Representative            Date                   Time    __________________________________________ Nurse's Signature  Leucovorin injection What is this medicine? LEUCOVORIN (loo koe VOR in)  is used to prevent or treat the harmful effects of some medicines. This medicine is used to treat anemia caused by a low amount of folic acid in the body. It is also used with 5-fluorouracil (5-FU) to treat colon cancer. This medicine may be used for other purposes; ask your health care provider or pharmacist if you have questions. What should I tell my health care provider before I take this medicine? They need to know if you have any of these conditions: -anemia from low levels of vitamin B-12 in the blood -an unusual or allergic reaction to leucovorin, folic acid, other medicines, foods, dyes, or preservatives -pregnant or trying to get pregnant -breast-feeding How should I use this medicine? This medicine is for injection into a muscle or into a vein. It is given by a health care professional in a hospital or clinic setting. Talk to your pediatrician regarding the use of this medicine in children. Special care may be needed. Overdosage: If you think you have taken too much of this medicine contact a poison control center or emergency room at once. NOTE: This medicine is only for you. Do not share this medicine with others. What if I miss a dose? This does not apply. What may interact with this medicine? -capecitabine -fluorouracil -phenobarbital -phenytoin -primidone -trimethoprim-sulfamethoxazole This list may not describe all possible interactions. Give your health care provider a list of all the medicines, herbs, non-prescription drugs, or dietary supplements you use. Also tell them if  you smoke, drink alcohol, or use illegal drugs. Some items may interact with your medicine. What should I watch for while using this medicine? Your condition will be monitored carefully while you are receiving this medicine. This medicine may increase the side effects of 5-fluorouracil, 5-FU. Tell your doctor or health care professional if you have diarrhea or mouth sores that do not get better or that  get worse. What side effects may I notice from receiving this medicine? Side effects that you should report to your doctor or health care professional as soon as possible: -allergic reactions like skin rash, itching or hives, swelling of the face, lips, or tongue -breathing problems -fever, infection -mouth sores -unusual bleeding or bruising -unusually weak or tired Side effects that usually do not require medical attention (report to your doctor or health care professional if they continue or are bothersome): -constipation or diarrhea -loss of appetite -nausea, vomiting This list may not describe all possible side effects. Call your doctor for medical advice about side effects. You may report side effects to FDA at 1-800-FDA-1088. Where should I keep my medicine? This drug is given in a hospital or clinic and will not be stored at home. NOTE: This sheet is a summary. It may not cover all possible information. If you have questions about this medicine, talk to your doctor, pharmacist, or health care provider.  2012, Elsevier/Gold Standard. (12/24/2007 4:50:29 PM)   Fluorouracil, 5-FU injection What is this medicine? FLUOROURACIL, 5-FU (flure oh YOOR a sil) is a chemotherapy drug. It slows the growth of cancer cells. This medicine is used to treat many types of cancer like breast cancer, colon or rectal cancer, pancreatic cancer, and stomach cancer. This medicine may be used for other purposes; ask your health care provider or pharmacist if you have questions. What should I tell my health care provider before I take this medicine? They need to know if you have any of these conditions: -blood disorders -dihydropyrimidine dehydrogenase (DPD) deficiency -infection (especially a virus infection such as chickenpox, cold sores, or herpes) -kidney disease -liver disease -malnourished, poor nutrition -recent or ongoing radiation therapy -an unusual or allergic reaction to fluorouracil, other  chemotherapy, other medicines, foods, dyes, or preservatives -pregnant or trying to get pregnant -breast-feeding How should I use this medicine? This drug is given as an infusion or injection into a vein. It is administered in a hospital or clinic by a specially trained health care professional. Talk to your pediatrician regarding the use of this medicine in children. Special care may be needed. Overdosage: If you think you have taken too much of this medicine contact a poison control center or emergency room at once. NOTE: This medicine is only for you. Do not share this medicine with others. What if I miss a dose? It is important not to miss your dose. Call your doctor or health care professional if you are unable to keep an appointment. What may interact with this medicine? -allopurinol -cimetidine -dapsone -digoxin -hydroxyurea -leucovorin -levamisole -medicines for seizures like ethotoin, fosphenytoin, phenytoin -medicines to increase blood counts like filgrastim, pegfilgrastim, sargramostim -medicines that treat or prevent blood clots like warfarin, enoxaparin, and dalteparin -methotrexate -metronidazole -pyrimethamine -some other chemotherapy drugs like busulfan, cisplatin, estramustine, vinblastine -trimethoprim -trimetrexate -vaccines Talk to your doctor or health care professional before taking any of these medicines: -acetaminophen -aspirin -ibuprofen -ketoprofen -naproxen This list may not describe all possible interactions. Give your health care provider a list of all the medicines, herbs, non-prescription drugs, or  dietary supplements you use. Also tell them if you smoke, drink alcohol, or use illegal drugs. Some items may interact with your medicine. What should I watch for while using this medicine? Visit your doctor for checks on your progress. This drug may make you feel generally unwell. This is not uncommon, as chemotherapy can affect healthy cells as well as  cancer cells. Report any side effects. Continue your course of treatment even though you feel ill unless your doctor tells you to stop. In some cases, you may be given additional medicines to help with side effects. Follow all directions for their use. Call your doctor or health care professional for advice if you get a fever, chills or sore throat, or other symptoms of a cold or flu. Do not treat yourself. This drug decreases your body's ability to fight infections. Try to avoid being around people who are sick. This medicine may increase your risk to bruise or bleed. Call your doctor or health care professional if you notice any unusual bleeding. Be careful brushing and flossing your teeth or using a toothpick because you may get an infection or bleed more easily. If you have any dental work done, tell your dentist you are receiving this medicine. Avoid taking products that contain aspirin, acetaminophen, ibuprofen, naproxen, or ketoprofen unless instructed by your doctor. These medicines may hide a fever. Do not become pregnant while taking this medicine. Women should inform their doctor if they wish to become pregnant or think they might be pregnant. There is a potential for serious side effects to an unborn child. Talk to your health care professional or pharmacist for more information. Do not breast-feed an infant while taking this medicine. Men should inform their doctor if they wish to father a child. This medicine may lower sperm counts. Do not treat diarrhea with over the counter products. Contact your doctor if you have diarrhea that lasts more than 2 days or if it is severe and watery. This medicine can make you more sensitive to the sun. Keep out of the sun. If you cannot avoid being in the sun, wear protective clothing and use sunscreen. Do not use sun lamps or tanning beds/booths. What side effects may I notice from receiving this medicine? Side effects that you should report to your doctor  or health care professional as soon as possible: -allergic reactions like skin rash, itching or hives, swelling of the face, lips, or tongue -low blood counts - this medicine may decrease the number of white blood cells, red blood cells and platelets. You may be at increased risk for infections and bleeding. -signs of infection - fever or chills, cough, sore throat, pain or difficulty passing urine -signs of decreased platelets or bleeding - bruising, pinpoint red spots on the skin, black, tarry stools, blood in the urine -signs of decreased red blood cells - unusually weak or tired, fainting spells, lightheadedness -breathing problems -changes in vision -chest pain -mouth sores -nausea and vomiting -pain, swelling, redness at site where injected -pain, tingling, numbness in the hands or feet -redness, swelling, or sores on hands or feet -stomach pain -unusual bleeding Side effects that usually do not require medical attention (report to your doctor or health care professional if they continue or are bothersome): -changes in finger or toe nails -diarrhea -dry or itchy skin -hair loss -headache -loss of appetite -sensitivity of eyes to the light -stomach upset -unusually teary eyes This list may not describe all possible side effects. Call your doctor for  medical advice about side effects. You may report side effects to FDA at 1-800-FDA-1088. Where should I keep my medicine? This drug is given in a hospital or clinic and will not be stored at home. NOTE: This sheet is a summary. It may not cover all possible information. If you have questions about this medicine, talk to your doctor, pharmacist, or health care provider.  2013, Elsevier/Gold Standard. (10/23/2007 1:53:16 PM)

## 2012-06-11 ENCOUNTER — Telehealth: Payer: Self-pay | Admitting: *Deleted

## 2012-06-11 NOTE — Telephone Encounter (Signed)
Message left at home number.  Requested a return cal for f/u.  Awaiting return call from patient.

## 2012-06-12 ENCOUNTER — Ambulatory Visit (HOSPITAL_BASED_OUTPATIENT_CLINIC_OR_DEPARTMENT_OTHER): Payer: BC Managed Care – PPO

## 2012-06-12 VITALS — BP 152/98 | HR 96 | Temp 96.7°F | Resp 18

## 2012-06-12 DIAGNOSIS — C189 Malignant neoplasm of colon, unspecified: Secondary | ICD-10-CM

## 2012-06-12 DIAGNOSIS — C184 Malignant neoplasm of transverse colon: Secondary | ICD-10-CM

## 2012-06-12 DIAGNOSIS — Z452 Encounter for adjustment and management of vascular access device: Secondary | ICD-10-CM

## 2012-06-12 MED ORDER — SODIUM CHLORIDE 0.9 % IJ SOLN
10.0000 mL | INTRAMUSCULAR | Status: DC | PRN
  Administered 2012-06-12: 10 mL
  Filled 2012-06-12: qty 10

## 2012-06-12 MED ORDER — HEPARIN SOD (PORK) LOCK FLUSH 100 UNIT/ML IV SOLN
500.0000 [IU] | Freq: Once | INTRAVENOUS | Status: AC | PRN
  Administered 2012-06-12: 500 [IU]
  Filled 2012-06-12: qty 5

## 2012-06-12 NOTE — Progress Notes (Signed)
Bandage with evidence of bleeding from incision area of new port,.  Charge rn stephanie checked site and pt instructed to call surgeon if any further bleeding occurs.  Appears glue came of a little  Prematurely.  dmr

## 2012-06-14 ENCOUNTER — Telehealth (INDEPENDENT_AMBULATORY_CARE_PROVIDER_SITE_OTHER): Payer: Self-pay | Admitting: General Surgery

## 2012-06-14 NOTE — Telephone Encounter (Signed)
LMOM for PO appt on 12/23 at 3:00

## 2012-06-19 ENCOUNTER — Other Ambulatory Visit: Payer: Self-pay | Admitting: Oncology

## 2012-06-24 ENCOUNTER — Encounter (INDEPENDENT_AMBULATORY_CARE_PROVIDER_SITE_OTHER): Payer: Self-pay | Admitting: General Surgery

## 2012-06-24 ENCOUNTER — Ambulatory Visit (INDEPENDENT_AMBULATORY_CARE_PROVIDER_SITE_OTHER): Payer: BC Managed Care – PPO | Admitting: General Surgery

## 2012-06-24 VITALS — BP 118/72 | HR 82 | Temp 97.8°F | Resp 16 | Ht 69.0 in | Wt 184.0 lb

## 2012-06-24 DIAGNOSIS — Z09 Encounter for follow-up examination after completed treatment for conditions other than malignant neoplasm: Secondary | ICD-10-CM

## 2012-06-24 NOTE — Progress Notes (Signed)
Subjective:     Patient ID: Rachel Clark, female   DOB: [birth year], 61 y.o.   MRN: [id number]  HPI This is a 61 year old female who underwent off from a colectomy for colon cancer. I placed the port to begin her on chemotherapy. Apparently the cancer center they were concerned that she had a hematoma around her port site. This is functioning well and she has had no problems at all. She thinks it looks fine and is not complaints at all.  Review of Systems     Objective:   Physical Exam Port in good position without hematoma or infection    Assessment:     Colon cancer Status post port placement    Plan:     Her port looks fine there is no issue and she can see me as needed.

## 2012-06-25 ENCOUNTER — Encounter: Payer: Self-pay | Admitting: Oncology

## 2012-06-25 ENCOUNTER — Telehealth: Payer: Self-pay | Admitting: Oncology

## 2012-06-25 ENCOUNTER — Other Ambulatory Visit (HOSPITAL_BASED_OUTPATIENT_CLINIC_OR_DEPARTMENT_OTHER): Payer: BC Managed Care – PPO | Admitting: Lab

## 2012-06-25 ENCOUNTER — Ambulatory Visit (HOSPITAL_BASED_OUTPATIENT_CLINIC_OR_DEPARTMENT_OTHER): Payer: BC Managed Care – PPO

## 2012-06-25 ENCOUNTER — Ambulatory Visit (HOSPITAL_BASED_OUTPATIENT_CLINIC_OR_DEPARTMENT_OTHER): Payer: BC Managed Care – PPO | Admitting: Oncology

## 2012-06-25 VITALS — BP 159/83 | HR 86 | Temp 97.0°F | Resp 20 | Ht 69.0 in | Wt 184.0 lb

## 2012-06-25 DIAGNOSIS — C189 Malignant neoplasm of colon, unspecified: Secondary | ICD-10-CM

## 2012-06-25 DIAGNOSIS — C184 Malignant neoplasm of transverse colon: Secondary | ICD-10-CM

## 2012-06-25 DIAGNOSIS — D472 Monoclonal gammopathy: Secondary | ICD-10-CM

## 2012-06-25 DIAGNOSIS — R197 Diarrhea, unspecified: Secondary | ICD-10-CM

## 2012-06-25 DIAGNOSIS — Z5111 Encounter for antineoplastic chemotherapy: Secondary | ICD-10-CM

## 2012-06-25 LAB — CBC WITH DIFFERENTIAL/PLATELET
Basophils Absolute: 0 10*3/uL (ref 0.0–0.1)
Eosinophils Absolute: 0.3 10*3/uL (ref 0.0–0.5)
HGB: 13 g/dL (ref 11.6–15.9)
LYMPH%: 35.7 % (ref 14.0–49.7)
MCV: 95.3 fL (ref 79.5–101.0)
MONO%: 8.8 % (ref 0.0–14.0)
NEUT#: 3 10*3/uL (ref 1.5–6.5)
Platelets: 251 10*3/uL (ref 145–400)

## 2012-06-25 LAB — COMPREHENSIVE METABOLIC PANEL (CC13)
ALT: 15 U/L (ref 0–55)
Albumin: 3.6 g/dL (ref 3.5–5.0)
Alkaline Phosphatase: 118 U/L (ref 40–150)
BUN: 14 mg/dL (ref 7.0–26.0)
Calcium: 8.6 mg/dL (ref 8.4–10.4)
Creatinine: 0.6 mg/dL (ref 0.6–1.1)
Glucose: 88 mg/dl (ref 70–99)
Total Bilirubin: 0.29 mg/dL (ref 0.20–1.20)
Total Protein: 6.9 g/dL (ref 6.4–8.3)

## 2012-06-25 MED ORDER — FLUOROURACIL CHEMO INJECTION 2.5 GM/50ML
300.0000 mg/m2 | Freq: Once | INTRAVENOUS | Status: AC
  Administered 2012-06-25: 600 mg via INTRAVENOUS
  Filled 2012-06-25: qty 12

## 2012-06-25 MED ORDER — SODIUM CHLORIDE 0.9 % IV SOLN
Freq: Once | INTRAVENOUS | Status: AC
  Administered 2012-06-25: 10:00:00 via INTRAVENOUS

## 2012-06-25 MED ORDER — LEUCOVORIN CALCIUM INJECTION 350 MG
300.0000 mg/m2 | Freq: Once | INTRAVENOUS | Status: AC
  Administered 2012-06-25: 600 mg via INTRAVENOUS
  Filled 2012-06-25: qty 30

## 2012-06-25 MED ORDER — SODIUM CHLORIDE 0.9 % IV SOLN
1800.0000 mg/m2 | INTRAVENOUS | Status: DC
  Administered 2012-06-25: 3600 mg via INTRAVENOUS
  Filled 2012-06-25: qty 72

## 2012-06-25 NOTE — Telephone Encounter (Signed)
appts made and printed for pt aom °

## 2012-06-25 NOTE — Patient Instructions (Addendum)
[facility] Discharge Instructions for Patients Receiving Chemotherapy  Today you received the following chemotherapy agents :  Leucovorin,  5 FU.  To help prevent nausea and vomiting after your treatment, we encourage you to take your nausea medication as instructed by your physician.    If you develop nausea and vomiting that is not controlled by your nausea medication, call the clinic. If it is after clinic hours your family physician or the after hours number for the clinic or go to the Emergency Department.   BELOW ARE SYMPTOMS THAT SHOULD BE REPORTED IMMEDIATELY:  *FEVER GREATER THAN 100.5 F  *CHILLS WITH OR WITHOUT FEVER  NAUSEA AND VOMITING THAT IS NOT CONTROLLED WITH YOUR NAUSEA MEDICATION  *UNUSUAL SHORTNESS OF BREATH  *UNUSUAL BRUISING OR BLEEDING  TENDERNESS IN MOUTH AND THROAT WITH OR WITHOUT PRESENCE OF ULCERS  *URINARY PROBLEMS  *BOWEL PROBLEMS  UNUSUAL RASH Items with * indicate a potential emergency and should be followed up as soon as possible.  One of the nurses will contact you 24 hours after your treatment. Please let the nurse know about any problems that you may have experienced. Feel free to call the clinic you have any questions or concerns. The clinic phone number is [phone].   I have been informed and understand all the instructions given to me. I know to contact the clinic, my physician, or go to the Emergency Department if any problems should occur. I do not have any questions at this time, but understand that I may call the clinic during office hours   should I have any questions or need assistance in obtaining follow up care.    __________________________________________  _____________  __________ Signature of Patient or Authorized Representative            Date                   Time    __________________________________________ Nurse's Signature

## 2012-06-25 NOTE — Progress Notes (Signed)
[facility]  Telephone:([phone] [phone]   OFFICE PROGRESS NOTE   Cc:  PICKETT,KATHERINE, PA  DIAGNOSIS AND PAST THERAPY:  1. MGUS: Watchful observation.  2. Newly diagnosed pT3 N0 M0 transverse colon adenocarcinoma. S/p laparoscopic-assisted transverse colectomy by Dr. Dwain Sarna on 12/19/2011. 3. Started adjuvant Xeloda 2,000mg  PO BID in 02/2012. Stopped November 2013 due to diarrhea.  CURRENT THERAPY: 5FU and leucovorin given every 2 weeks. Started on 06/10/12.  INTERVAL HISTORY: Rachel Clark 61 y.o. female returns for regular follow up with her husband. Started 5FU/leucovorin 2 weeks ago. She has not had diarrhea. Denies abdominal pain, nausea, vomiting. She had taste changes with some decreased appetite that lasted about 1 week. She is otherwise eating well and weight is stable today.  Patient denies fever, anorexia, weight loss, fatigue, headache, visual changes, confusion, drenching night sweats, palpable lymph node swelling, mucositis, odynophagia, dysphagia, nausea vomiting, jaundice, chest pain, palpitation, shortness of breath, dyspnea on exertion, productive cough, gum bleeding, epistaxis, hematemesis, hemoptysis, abdominal pain, abdominal swelling, early satiety, melena, hematochezia, hematuria, skin rash, spontaneous bleeding, joint swelling, joint pain, heat or cold intolerance, bowel bladder incontinence, back pain, focal motor weakness, paresthesia, depression, suicidal or homicidal ideation, feeling hopelessness.    Past Medical History  Diagnosis Date  . Coronary artery disease     Has had remote NSTEMI with PCI to the RCA; S/P CABG in 2007; LAST CATH WITH MODERATE DISEASE IN A VERY SMALL DIAGONAL BRANCH; SHE IS MANAGED MEDICALLY  . Dyslipidemia   . Seizure disorder   . History of fibromyalgia   . Chest pain   . Monoclonal gammopathy of unknown significance   . Colon cancer 12/04/2011    s/p Laparoscopic-assisted transverse  colectomy on 12/19/2011 by Dr. Dwain Sarna.  pT3 N0 M0.   Marland Kitchen Hyperlipidemia   . Myocardial infarction 2007  . PONV (postoperative nausea and vomiting)   . Bronchitis     hx of;>76yrs  . Headache   . Seizures 12/06/11    last seizure over 40 yrs ago;takes Tegretol   . Fibromyalgia   . Arthritis     hands   . GERD (gastroesophageal reflux disease)     takes Pantoprazole daily  . Gastric ulcer   . Iron deficiency anemia 11/17/2011  . Hypothyroid     takes Levothyroxine daily  . Depression     takes Cymbalta daily  . Diarrhea 05/10/2012  . Diarrhea 05/10/2012    Past Surgical History  Procedure Date  . Partial hysterectomy 20+yrs ago  . Posterior laminectomy / decompression cervical spine     20+yrs ago  . Cardiac catheterization 05/20/2009    obstructive native vessel disease in LAD, RCA, and first diagonal, patent vein graft to distal RCA and LIMA to LAD,normal. ef 60%  . Colonoscopy   . Coronary artery bypass graft 2007    x 2  . Coronary angioplasty     1 stent  . Colon resection 12/19/2011    Procedure: COLON RESECTION LAPAROSCOPIC;  Surgeon: Emelia Loron, MD;  Location: [facility] OR;  Service: General;  Laterality: N/A;  laparoscopic hand assisted partial colon resection  . Portacath placement 06/04/2012    Procedure: INSERTION PORT-A-CATH;  Surgeon: Emelia Loron, MD;  Location: [facility] OR;  Service: General;  Laterality: N/A;  Insertion of port-a-cath     Current Outpatient Prescriptions  Medication Sig Dispense Refill  . carbamazepine (TEGRETOL XR) 100 MG 12 hr tablet Take 100-200 mg by mouth 3 (three) times daily.  Take 1 cap in the morning, 2 cap at lunch, and 2 cap in the evening      . diphenhydrAMINE (BENADRYL) 25 MG tablet Take 25 mg by mouth every 6 (six) hours as needed. For allergies.      . diphenoxylate-atropine (LOMOTIL) 2.5-0.025 MG per tablet Take 1 tablet by mouth 4 (four) times daily as needed for diarrhea or loose stools.  30 tablet  0  . DULoxetine (CYMBALTA) 60  MG capsule Take 60 mg by mouth every morning.      Marland Kitchen estradiol (VIVELLE-DOT) 0.075 MG/24HR Place 1 patch onto the skin 2 (two) times a week. On Sundays and Wednesdays.      Marland Kitchen ethosuximide (ZARONTIN) 250 MG capsule Take 250-500 mg by mouth 3 (three) times daily. Take 1 tablet every morning, take 2 tablets every afternoon, and take 2 tablets every night at bedtime.      Marland Kitchen ezetimibe (ZETIA) 10 MG tablet Take 10 mg by mouth at bedtime.      Marland Kitchen HYDROcodone-acetaminophen (NORCO) 10-325 MG per tablet Take 1 tablet by mouth every 6 (six) hours as needed for pain.  10 tablet  0  . levothyroxine (SYNTHROID, LEVOTHROID) 50 MCG tablet Take 50 mcg by mouth daily.       Marland Kitchen lidocaine-prilocaine (EMLA) cream Apply topically as needed. Apply to [facility] site one hour prior to needle stick as needed to numb skin.  30 g  3  . nitroGLYCERIN (NITROSTAT) 0.4 MG SL tablet Place 0.4 mg under the tongue every 5 (five) minutes as needed. For chest pain.      Marland Kitchen ondansetron (ZOFRAN ODT) 8 MG disintegrating tablet Take 1 tablet (8 mg total) by mouth every 8 (eight) hours as needed for nausea.  20 tablet  0  . pantoprazole (PROTONIX) 40 MG tablet Take 1 tablet (40 mg total) by mouth every morning.  30 tablet  0  . potassium chloride SA (K-DUR,KLOR-CON) 20 MEQ tablet Take 2 tablets (40 mEq total) by mouth 2 (two) times daily.  80 tablet  3  . prasugrel (EFFIENT) 10 MG TABS Take 10 mg by mouth every morning.      . Vitamin D, Ergocalciferol, (DRISDOL) 50000 UNITS CAPS Take 50,000 Units by mouth 2 (two) times a week. Pt takes twice weekly.  Monday and Wednesday        ALLERGIES:  is allergic to aspirin; erythromycin; imdur; lisinopril; niacin and related; penicillins; statins; sulfa drugs cross reactors; and tetracyclines & related.  REVIEW OF SYSTEMS:  The rest of the 14-point review of system was negative.   Filed Vitals:   06/25/12 0845  BP: 159/83  Pulse: 86  Temp: 97 F (36.1 C)  Resp: 20   Wt Readings from Last 3  Encounters:  06/25/12 184 lb (83.462 kg)  06/24/12 184 lb (83.462 kg)  06/03/12 184 lb 3.2 oz (83.553 kg)   ECOG Performance status: 1  PHYSICAL EXAMINATION:   General:  well-nourished woman in no acute distress.  Eyes:  no scleral icterus.  ENT:  There were no oropharyngeal lesions.  Neck was without thyromegaly.  Lymphatics:  Negative cervical, supraclavicular or axillary adenopathy.  Respiratory: lungs were clear bilaterally without wheezing or crackles.  Cardiovascular:  Regular rate and rhythm, S1/S2, without murmur, rub or gallop.  There was no pedal edema.  GI:  abdomen was soft, flat, nontender, nondistended, without organomegaly.  Muscoloskeletal:  no spinal tenderness of palpation of vertebral spine.  Skin exam was without echymosis, petichae.  Neuro exam was nonfocal.  Patient was able to get on and off exam table without assistance.  Gait was normal.  Patient was alerted and oriented.  Attention was good.   Language was appropriate.  Mood was normal without depression.  Speech was not pressured.  Thought content was not tangential.     LABORATORY/RADIOLOGY DATA:  Lab Results  Component Value Date   WBC 6.0 06/25/2012   HGB 13.0 06/25/2012   HCT 38.5 06/25/2012   PLT 251 06/25/2012   GLUCOSE 90 06/10/2012   CHOL 268* 08/17/2011   TRIG 107 08/17/2011   HDL 82 08/17/2011   LDLCALC 165* 08/17/2011   ALKPHOS 104 06/10/2012   ALT 13 06/10/2012   AST 13 06/10/2012   NA 137 06/10/2012   K 4.4 06/10/2012   CL 106 06/10/2012   CREATININE 0.6 06/10/2012   BUN 15.0 06/10/2012   CO2 23 06/10/2012   INR 1.03 08/17/2011   HGBA1C 5.7* 08/16/2011    ASSESSMENT AND PLAN:   1. Diagnosis: Stage II, normal risk (not high risk) colon cancer. S/P 2 months of Xeloda which was stopped due to diarrhea. She is now on 5FU and leucovorin every 2 weeks. She is tolerating this well with grade 1 taste changes. Recommend that she proceed with cycle 2 of her chemotherapy today without dose modification. Plan is  for 4 months as she already had about 2 months of adjuvant Xeloda.   2.   MGUS: SPEP and light chains were stable in Aug 2013. There is no evidence of progression to active myeloma. Will continue observation.  Next myeloma panel should be checked in Feb 2014 unless there are concerning signs for active myeloma.   3.  Follow up: 2 weeks.  The length of time of the face-to-face encounter was 15 minutes. More than 50% of time was spent counseling and coordination of care.

## 2012-06-27 ENCOUNTER — Telehealth: Payer: Self-pay | Admitting: *Deleted

## 2012-06-27 ENCOUNTER — Ambulatory Visit: Payer: BC Managed Care – PPO

## 2012-06-27 VITALS — BP 143/89 | HR 91 | Temp 97.5°F

## 2012-06-27 DIAGNOSIS — C189 Malignant neoplasm of colon, unspecified: Secondary | ICD-10-CM

## 2012-06-27 MED ORDER — HEPARIN SOD (PORK) LOCK FLUSH 100 UNIT/ML IV SOLN
500.0000 [IU] | Freq: Once | INTRAVENOUS | Status: DC | PRN
  Filled 2012-06-27: qty 5

## 2012-06-27 MED ORDER — SODIUM CHLORIDE 0.9 % IJ SOLN
10.0000 mL | INTRAMUSCULAR | Status: DC | PRN
  Filled 2012-06-27: qty 10

## 2012-06-27 NOTE — Progress Notes (Signed)
Patient arrived with pump still infusing. 70 ml noted remaining in bag. Spoke with Dr. Gaylyn Rong. Patient to continue with infusion and return tomorrow for pump disconnect. Patient instructed to return at 0730 in morning. Patient also instructed to remove batteries from pump if infusion completes prior to that time. Patient verbalized understanding.

## 2012-06-27 NOTE — Telephone Encounter (Signed)
Patient's husband called regarding her pump. Message given to charge RN.  JMW

## 2012-06-28 ENCOUNTER — Ambulatory Visit (HOSPITAL_BASED_OUTPATIENT_CLINIC_OR_DEPARTMENT_OTHER): Payer: BC Managed Care – PPO

## 2012-06-28 ENCOUNTER — Other Ambulatory Visit: Payer: Self-pay | Admitting: Oncology

## 2012-06-28 VITALS — BP 124/77 | HR 72 | Temp 97.9°F

## 2012-06-28 DIAGNOSIS — C189 Malignant neoplasm of colon, unspecified: Secondary | ICD-10-CM

## 2012-06-28 DIAGNOSIS — C184 Malignant neoplasm of transverse colon: Secondary | ICD-10-CM

## 2012-06-28 MED ORDER — SODIUM CHLORIDE 0.9 % IJ SOLN
10.0000 mL | INTRAMUSCULAR | Status: AC | PRN
  Administered 2012-06-28: 10 mL
  Filled 2012-06-28: qty 10

## 2012-06-28 MED ORDER — HEPARIN SOD (PORK) LOCK FLUSH 100 UNIT/ML IV SOLN
500.0000 [IU] | INTRAVENOUS | Status: AC | PRN
  Administered 2012-06-28: 500 [IU]
  Filled 2012-06-28: qty 5

## 2012-06-28 MED ORDER — HEPARIN SOD (PORK) LOCK FLUSH 100 UNIT/ML IV SOLN
250.0000 [IU] | INTRAVENOUS | Status: DC | PRN
  Filled 2012-06-28: qty 5

## 2012-06-28 MED ORDER — SODIUM CHLORIDE 0.9 % IJ SOLN
10.0000 mL | INTRAMUSCULAR | Status: DC | PRN
  Filled 2012-06-28: qty 10

## 2012-06-28 NOTE — Progress Notes (Signed)
Pt with dried blood at [facility] site at [facility] [place].  Minimal amount of serosanguinous fluid expressed from [facility] site.  Pt also complained of slight sore throat.  States husband has sore throat also.  Pt feels is related to infectious process.  RN advised pt of symptoms of mucositis and thrush.  No white coating, redness or sores noted in mouth.  Instructed pt to call if sore throat worsens, develops white coating or sores or if soreness interferes with eating or drinking.  Pt repeated instructions and husband verbalized understanding also.

## 2012-06-30 ENCOUNTER — Other Ambulatory Visit: Payer: Self-pay | Admitting: Oncology

## 2012-07-07 NOTE — Patient Instructions (Addendum)
1.  Diagnosis:  Stage II colon cancer. 2.  Treatment:  Continue adjuvant IV 5FU.

## 2012-07-08 ENCOUNTER — Ambulatory Visit: Payer: BC Managed Care – PPO

## 2012-07-08 ENCOUNTER — Telehealth: Payer: Self-pay | Admitting: Oncology

## 2012-07-08 ENCOUNTER — Ambulatory Visit (HOSPITAL_BASED_OUTPATIENT_CLINIC_OR_DEPARTMENT_OTHER): Payer: BC Managed Care – PPO

## 2012-07-08 ENCOUNTER — Ambulatory Visit (HOSPITAL_BASED_OUTPATIENT_CLINIC_OR_DEPARTMENT_OTHER): Payer: BC Managed Care – PPO | Admitting: Oncology

## 2012-07-08 ENCOUNTER — Other Ambulatory Visit (HOSPITAL_BASED_OUTPATIENT_CLINIC_OR_DEPARTMENT_OTHER): Payer: BC Managed Care – PPO | Admitting: Lab

## 2012-07-08 ENCOUNTER — Telehealth: Payer: Self-pay | Admitting: *Deleted

## 2012-07-08 VITALS — BP 143/84 | HR 97 | Temp 96.6°F | Resp 18 | Ht 69.0 in | Wt 181.9 lb

## 2012-07-08 VITALS — BP 140/83 | HR 83 | Temp 97.4°F | Resp 20

## 2012-07-08 DIAGNOSIS — D472 Monoclonal gammopathy: Secondary | ICD-10-CM

## 2012-07-08 DIAGNOSIS — C184 Malignant neoplasm of transverse colon: Secondary | ICD-10-CM

## 2012-07-08 DIAGNOSIS — C189 Malignant neoplasm of colon, unspecified: Secondary | ICD-10-CM

## 2012-07-08 DIAGNOSIS — Z5111 Encounter for antineoplastic chemotherapy: Secondary | ICD-10-CM

## 2012-07-08 LAB — CBC WITH DIFFERENTIAL/PLATELET
BASO%: 0.6 % (ref 0.0–2.0)
EOS%: 4.1 % (ref 0.0–7.0)
HCT: 41.2 % (ref 34.8–46.6)
LYMPH%: 37.8 % (ref 14.0–49.7)
MCH: 32.3 pg (ref 25.1–34.0)
MCHC: 34 g/dL (ref 31.5–36.0)
MONO#: 0.6 10*3/uL (ref 0.1–0.9)
NEUT%: 48.5 % (ref 38.4–76.8)
RBC: 4.34 10*6/uL (ref 3.70–5.45)
WBC: 6.8 10*3/uL (ref 3.9–10.3)
lymph#: 2.6 10*3/uL (ref 0.9–3.3)
nRBC: 0 % (ref 0–0)

## 2012-07-08 LAB — COMPREHENSIVE METABOLIC PANEL (CC13)
ALT: 17 U/L (ref 0–55)
AST: 18 U/L (ref 5–34)
Alkaline Phosphatase: 104 U/L (ref 40–150)
BUN: 18 mg/dL (ref 7.0–26.0)
Chloride: 105 mEq/L (ref 98–107)
Creatinine: 0.8 mg/dL (ref 0.6–1.1)
Potassium: 4.5 mEq/L (ref 3.5–5.1)

## 2012-07-08 MED ORDER — SODIUM CHLORIDE 0.9 % IV SOLN
Freq: Once | INTRAVENOUS | Status: AC
  Administered 2012-07-08: 10:00:00 via INTRAVENOUS

## 2012-07-08 MED ORDER — SODIUM CHLORIDE 0.9 % IV SOLN
1800.0000 mg/m2 | INTRAVENOUS | Status: DC
  Administered 2012-07-08: 3600 mg via INTRAVENOUS
  Filled 2012-07-08: qty 72

## 2012-07-08 MED ORDER — HEPARIN SOD (PORK) LOCK FLUSH 100 UNIT/ML IV SOLN
500.0000 [IU] | Freq: Once | INTRAVENOUS | Status: DC | PRN
  Filled 2012-07-08: qty 5

## 2012-07-08 MED ORDER — FLUOROURACIL CHEMO INJECTION 2.5 GM/50ML
300.0000 mg/m2 | Freq: Once | INTRAVENOUS | Status: AC
  Administered 2012-07-08: 600 mg via INTRAVENOUS
  Filled 2012-07-08: qty 12

## 2012-07-08 MED ORDER — SODIUM CHLORIDE 0.9 % IJ SOLN
10.0000 mL | INTRAMUSCULAR | Status: DC | PRN
  Filled 2012-07-08: qty 10

## 2012-07-08 MED ORDER — LEUCOVORIN CALCIUM INJECTION 350 MG
300.0000 mg/m2 | Freq: Once | INTRAVENOUS | Status: AC
  Administered 2012-07-08: 600 mg via INTRAVENOUS
  Filled 2012-07-08: qty 30

## 2012-07-08 NOTE — Patient Instructions (Signed)
Patient aware of next appointment; discharged home with no complaints. 

## 2012-07-08 NOTE — Telephone Encounter (Signed)
gv and printed pt appt schedule for Jan and Feb....emailed michelle to add chemo...the patient aware

## 2012-07-08 NOTE — Progress Notes (Signed)
[facility]  Telephone:([phone] Fax:(336) [phone]   OFFICE PROGRESS NOTE   Cc:  PICKETT,KATHERINE, PA  DIAGNOSIS AND PAST THERAPY:  1. MGUS: Watchful observation.  2. Newly diagnosed pT3 N0 M0 transverse colon adenocarcinoma. S/p laparoscopic-assisted transverse colectomy by Dr. Dwain Sarna on 12/19/2011.   CURRENT THERAPY: started adjuvant Xeloda 2,000mg  PO BID in 02/2012.  She switched to IV 5FU in late 06/2012 due to poor toleration of Xeloda.   INTERVAL HISTORY: BUENA BOEHM 62 y.o. female returns for regular follow up with her husband.  She reports mild to moderate fatigue. She is able to care all activities of daily living. However after 2 PM, she needs to take a long nap every day.  She also has changed her taste buds.  She is trying her best  to eat and has maintained her weight.  She has some skin rash and redness in the bilateral feet; however there was no severe pain or skin sloughing off.  With IV 5-FU, she hasn't had problem with diarrhea or bowel pain or nausea vomiting.  Patient denies fever, anorexia, weight loss, headache, visual changes, confusion, drenching night sweats, palpable lymph node swelling, mucositis, odynophagia, dysphagia, nausea vomiting, jaundice, chest pain, palpitation, shortness of breath, dyspnea on exertion, productive cough, gum bleeding, epistaxis, hematemesis, hemoptysis, abdominal pain, abdominal swelling, early satiety, melena, hematochezia, hematuria, spontaneous bleeding, joint swelling, joint pain, heat or cold intolerance, bowel bladder incontinence, back pain, focal motor weakness, paresthesia, depression, suicidal or homicidal ideation, feeling hopelessness.    Past Medical History  Diagnosis Date  . Coronary artery disease     Has had remote NSTEMI with PCI to the RCA; S/P CABG in 2007; LAST CATH WITH MODERATE DISEASE IN A VERY SMALL DIAGONAL BRANCH; SHE IS MANAGED MEDICALLY  . Dyslipidemia   . Seizure disorder    . History of fibromyalgia   . Chest pain   . Monoclonal gammopathy of unknown significance   . Colon cancer 12/04/2011    s/p Laparoscopic-assisted transverse colectomy on 12/19/2011 by Dr. Dwain Sarna.  pT3 N0 M0.   Marland Kitchen Hyperlipidemia   . Myocardial infarction 2007  . PONV (postoperative nausea and vomiting)   . Bronchitis     hx of;>26yrs  . Headache   . Seizures 12/06/11    last seizure over 40 yrs ago;takes Tegretol   . Fibromyalgia   . Arthritis     hands   . GERD (gastroesophageal reflux disease)     takes Pantoprazole daily  . Gastric ulcer   . Iron deficiency anemia 11/17/2011  . Hypothyroid     takes Levothyroxine daily  . Depression     takes Cymbalta daily  . Diarrhea 05/10/2012  . Diarrhea 05/10/2012    Past Surgical History  Procedure Date  . Partial hysterectomy 20+yrs ago  . Posterior laminectomy / decompression cervical spine     20+yrs ago  . Cardiac catheterization 05/20/2009    obstructive native vessel disease in LAD, RCA, and first diagonal, patent vein graft to distal RCA and LIMA to LAD,normal. ef 60%  . Colonoscopy   . Coronary artery bypass graft 2007    x 2  . Coronary angioplasty     1 stent  . Colon resection 12/19/2011    Procedure: COLON RESECTION LAPAROSCOPIC;  Surgeon: Emelia Loron, MD;  Location: [facility] OR;  Service: General;  Laterality: N/A;  laparoscopic hand assisted partial colon resection  . Portacath placement 06/04/2012    Procedure: INSERTION PORT-A-CATH;  Surgeon: Emelia Loron, MD;  Location: [facility] OR;  Service: General;  Laterality: N/A;  Insertion of port-a-cath     Current Outpatient Prescriptions  Medication Sig Dispense Refill  . carbamazepine (TEGRETOL XR) 100 MG 12 hr tablet Take 100-200 mg by mouth 3 (three) times daily. Take 1 cap in the morning, 2 cap at lunch, and 2 cap in the evening      . diphenhydrAMINE (BENADRYL) 25 MG tablet Take 25 mg by mouth every 6 (six) hours as needed. For allergies.      .  diphenoxylate-atropine (LOMOTIL) 2.5-0.025 MG per tablet Take 1 tablet by mouth 4 (four) times daily as needed for diarrhea or loose stools.  30 tablet  0  . DULoxetine (CYMBALTA) 60 MG capsule Take 60 mg by mouth every morning.      Marland Kitchen estradiol (VIVELLE-DOT) 0.075 MG/24HR Place 1 patch onto the skin 2 (two) times a week. On Sundays and Wednesdays.      Marland Kitchen ethosuximide (ZARONTIN) 250 MG capsule Take 250-500 mg by mouth 3 (three) times daily. Take 1 tablet every morning, take 2 tablets every afternoon, and take 2 tablets every night at bedtime.      Marland Kitchen ezetimibe (ZETIA) 10 MG tablet Take 10 mg by mouth at bedtime.      Marland Kitchen levothyroxine (SYNTHROID, LEVOTHROID) 50 MCG tablet Take 50 mcg by mouth daily.       Marland Kitchen lidocaine-prilocaine (EMLA) cream Apply topically as needed. Apply to [facility] site one hour prior to needle stick as needed to numb skin.  30 g  3  . nitroGLYCERIN (NITROSTAT) 0.4 MG SL tablet Place 0.4 mg under the tongue every 5 (five) minutes as needed. For chest pain.      Marland Kitchen ondansetron (ZOFRAN ODT) 8 MG disintegrating tablet Take 1 tablet (8 mg total) by mouth every 8 (eight) hours as needed for nausea.  20 tablet  0  . pantoprazole (PROTONIX) 40 MG tablet Take 1 tablet (40 mg total) by mouth every morning.  30 tablet  0  . potassium chloride SA (K-DUR,KLOR-CON) 20 MEQ tablet Take 2 tablets (40 mEq total) by mouth 2 (two) times daily.  80 tablet  3  . prasugrel (EFFIENT) 10 MG TABS Take 10 mg by mouth every morning.      . Vitamin D, Ergocalciferol, (DRISDOL) 50000 UNITS CAPS Take 50,000 Units by mouth 2 (two) times a week. Pt takes twice weekly.  Monday and Wednesday      . HYDROcodone-acetaminophen (NORCO) 10-325 MG per tablet Take 1 tablet by mouth every 6 (six) hours as needed for pain.  10 tablet  0   No current facility-administered medications for this visit.   Facility-Administered Medications Ordered in Other Visits  Medication Dose Route Frequency Provider Last Rate Last Dose  .  fluorouracil (ADRUCIL) 3,600 mg in sodium chloride 0.9 % 150 mL chemo infusion  1,800 mg/m2 (Treatment Plan Actual) Intravenous 1 day or 1 dose Exie Parody, MD      . fluorouracil (ADRUCIL) chemo injection 600 mg  300 mg/m2 (Treatment Plan Actual) Intravenous Once Exie Parody, MD      . heparin lock flush 100 unit/mL  500 Units Intracatheter Once PRN Exie Parody, MD      . leucovorin 600 mg in dextrose 5 % 250 mL infusion  300 mg/m2 (Treatment Plan Actual) Intravenous Once Exie Parody, MD 140 mL/hr at 07/08/12 1012 600 mg at 07/08/12 1012  . sodium chloride 0.9 % injection  10 mL  10 mL Intracatheter PRN Exie Parody, MD        ALLERGIES:  is allergic to aspirin; erythromycin; imdur; lisinopril; niacin and related; penicillins; statins; sulfa drugs cross reactors; and tetracyclines & related.  REVIEW OF SYSTEMS:  The rest of the 14-point review of system was negative.   Filed Vitals:   07/08/12 0833  BP: 143/84  Pulse: 97  Temp: 96.6 F (35.9 C)  Resp: 18   Wt Readings from Last 3 Encounters:  07/08/12 181 lb 14.4 oz (82.509 kg)  06/25/12 184 lb (83.462 kg)  06/24/12 184 lb (83.462 kg)   ECOG Performance status: 1  PHYSICAL EXAMINATION:   General:  well-nourished woman in no acute distress.  Eyes:  no scleral icterus.  ENT:  There were no oropharyngeal lesions.  Neck was without thyromegaly.  Lymphatics:  Negative cervical, supraclavicular or axillary adenopathy.  Respiratory: lungs were clear bilaterally without wheezing or crackles.  Cardiovascular:  Regular rate and rhythm, S1/S2, without murmur, rub or gallop.  There was no pedal edema.  GI:  abdomen was soft, flat, nontender, nondistended, without organomegaly.  Muscoloskeletal:  no spinal tenderness of palpation of vertebral spine.  Skin exam was without echymosis, petichae.  There was some dry skin in the bottom of her feet however there is no desquamation. Neuro exam was nonfocal.  Patient was able to get on and off exam table without  assistance.  Gait was normal.  Patient was alerted and oriented.  Attention was good.   Language was appropriate.  Mood was normal without depression.  Speech was not pressured.  Thought content was not tangential.     LABORATORY/RADIOLOGY DATA:  Lab Results  Component Value Date   WBC 6.8 07/08/2012   HGB 14.0 07/08/2012   HCT 41.2 07/08/2012   PLT 317 07/08/2012   GLUCOSE 101* 07/08/2012   CHOL 268* 08/17/2011   TRIG 107 08/17/2011   HDL 82 08/17/2011   LDLCALC 165* 08/17/2011   ALKPHOS 104 07/08/2012   ALT 17 07/08/2012   AST 18 07/08/2012   NA 137 07/08/2012   K 4.5 07/08/2012   CL 105 07/08/2012   CREATININE 0.8 07/08/2012   BUN 18.0 07/08/2012   CO2 22 07/08/2012   INR 1.03 08/17/2011   HGBA1C 5.7* 08/16/2011    ASSESSMENT AND PLAN:   1. Diagnosis: Stage II, normal risk (not high risk) colon cancer. -Treatment:  On 5FU adjuvant chemotherapy.  She has grade 1 fatigue. She does not want to decrease the dose chemotherapy yet due to concern of possible decreased efficacy. Chemotherapy will finish by the end of March 2014. I advised her to inform us if her fatigue significantly worsens in the future so that chemotherapy can be decreased.  2.   MGUS: SPEP and light chains were stable in Aug 2013. There is no evidence of progression to active myeloma. Will continue observation.  Next myeloma panel should be checked in Feb 2014 unless there are concerning signs for active myeloma.   3.  Follow up: lab/chemo every 2 weeks; follow up every 4 weeks but sooner if concerning symptoms.   The length of time of the face-to-face encounter was 25 minutes. More than 50% of time was spent counseling and coordination of care.

## 2012-07-08 NOTE — Telephone Encounter (Signed)
Per staff message and POF I have scheduled appts.  JMW  

## 2012-07-10 ENCOUNTER — Ambulatory Visit (HOSPITAL_BASED_OUTPATIENT_CLINIC_OR_DEPARTMENT_OTHER): Payer: BC Managed Care – PPO

## 2012-07-10 VITALS — BP 144/82 | HR 78 | Temp 98.2°F | Resp 20

## 2012-07-10 DIAGNOSIS — Z452 Encounter for adjustment and management of vascular access device: Secondary | ICD-10-CM

## 2012-07-10 DIAGNOSIS — C184 Malignant neoplasm of transverse colon: Secondary | ICD-10-CM

## 2012-07-10 DIAGNOSIS — C189 Malignant neoplasm of colon, unspecified: Secondary | ICD-10-CM

## 2012-07-10 MED ORDER — SODIUM CHLORIDE 0.9 % IJ SOLN
10.0000 mL | INTRAMUSCULAR | Status: DC | PRN
  Administered 2012-07-10: 10 mL
  Filled 2012-07-10: qty 10

## 2012-07-10 MED ORDER — HEPARIN SOD (PORK) LOCK FLUSH 100 UNIT/ML IV SOLN
500.0000 [IU] | Freq: Once | INTRAVENOUS | Status: AC | PRN
  Administered 2012-07-10: 500 [IU]
  Filled 2012-07-10: qty 5

## 2012-07-18 ENCOUNTER — Other Ambulatory Visit: Payer: BC Managed Care – PPO

## 2012-07-22 ENCOUNTER — Other Ambulatory Visit (HOSPITAL_BASED_OUTPATIENT_CLINIC_OR_DEPARTMENT_OTHER): Payer: BC Managed Care – PPO | Admitting: Lab

## 2012-07-22 ENCOUNTER — Ambulatory Visit (HOSPITAL_BASED_OUTPATIENT_CLINIC_OR_DEPARTMENT_OTHER): Payer: BC Managed Care – PPO

## 2012-07-22 VITALS — BP 133/68 | HR 75 | Temp 96.7°F | Resp 20

## 2012-07-22 DIAGNOSIS — C189 Malignant neoplasm of colon, unspecified: Secondary | ICD-10-CM

## 2012-07-22 DIAGNOSIS — C184 Malignant neoplasm of transverse colon: Secondary | ICD-10-CM

## 2012-07-22 DIAGNOSIS — Z5111 Encounter for antineoplastic chemotherapy: Secondary | ICD-10-CM

## 2012-07-22 LAB — BASIC METABOLIC PANEL (CC13)
BUN: 19 mg/dL (ref 7.0–26.0)
Calcium: 8.6 mg/dL (ref 8.4–10.4)
Creatinine: 0.7 mg/dL (ref 0.6–1.1)
Glucose: 96 mg/dl (ref 70–99)
Potassium: 4.6 mEq/L (ref 3.5–5.1)

## 2012-07-22 LAB — CBC WITH DIFFERENTIAL/PLATELET
Basophils Absolute: 0 10*3/uL (ref 0.0–0.1)
Eosinophils Absolute: 0.3 10*3/uL (ref 0.0–0.5)
HGB: 12.8 g/dL (ref 11.6–15.9)
LYMPH%: 38.5 % (ref 14.0–49.7)
MCH: 32 pg (ref 25.1–34.0)
MCV: 95 fL (ref 79.5–101.0)
MONO%: 8.9 % (ref 0.0–14.0)
NEUT#: 2.5 10*3/uL (ref 1.5–6.5)
NEUT%: 47.1 % (ref 38.4–76.8)
Platelets: 300 10*3/uL (ref 145–400)

## 2012-07-22 MED ORDER — FLUOROURACIL CHEMO INJECTION 2.5 GM/50ML
300.0000 mg/m2 | Freq: Once | INTRAVENOUS | Status: AC
  Administered 2012-07-22: 600 mg via INTRAVENOUS
  Filled 2012-07-22: qty 12

## 2012-07-22 MED ORDER — LEUCOVORIN CALCIUM INJECTION 350 MG
300.0000 mg/m2 | Freq: Once | INTRAVENOUS | Status: AC
  Administered 2012-07-22: 600 mg via INTRAVENOUS
  Filled 2012-07-22: qty 30

## 2012-07-22 MED ORDER — SODIUM CHLORIDE 0.9 % IV SOLN
1800.0000 mg/m2 | INTRAVENOUS | Status: DC
  Administered 2012-07-22: 3600 mg via INTRAVENOUS
  Filled 2012-07-22: qty 72

## 2012-07-22 MED ORDER — SODIUM CHLORIDE 0.9 % IV SOLN
Freq: Once | INTRAVENOUS | Status: AC
  Administered 2012-07-22: 09:00:00 via INTRAVENOUS

## 2012-07-22 NOTE — Patient Instructions (Addendum)
[facility] Discharge Instructions for Patients Receiving Chemotherapy  Today you received the following chemotherapy agents ;  Leucovorin and 5FU (fluoracil) along w/ 5FU infusion pump x 46 hrs.    If you develop nausea and vomiting that is not controlled by your nausea medication, call the clinic. (Your chemo regimen does not typically cause any nausea) If it is after clinic hours your family physician or the after hours number for the clinic or go to the Emergency Department.   BELOW ARE SYMPTOMS THAT SHOULD BE REPORTED IMMEDIATELY:  *FEVER GREATER THAN 100.5 F  *CHILLS WITH OR WITHOUT FEVER  NAUSEA AND VOMITING THAT IS NOT CONTROLLED WITH YOUR NAUSEA MEDICATION  *UNUSUAL SHORTNESS OF BREATH  *UNUSUAL BRUISING OR BLEEDING  TENDERNESS IN MOUTH AND THROAT WITH OR WITHOUT PRESENCE OF ULCERS  *URINARY PROBLEMS  *BOWEL PROBLEMS  UNUSUAL RASH Items with * indicate a potential emergency and should be followed up as soon as possible.   Feel free to call the clinic you have any questions or concerns. The clinic phone number is [phone].   I have been informed and understand all the instructions given to me. I know to contact the clinic, my physician, or go to the Emergency Department if any problems should occur. I do not have any questions at this time, but understand that I may call the clinic during office hours   should I have any questions or need assistance in obtaining follow up care.    __________________________________________  _____________  __________ Signature of Patient or Authorized Representative            Date                   Time    __________________________________________ Nurse's Signature

## 2012-07-24 ENCOUNTER — Ambulatory Visit (HOSPITAL_BASED_OUTPATIENT_CLINIC_OR_DEPARTMENT_OTHER): Payer: BC Managed Care – PPO

## 2012-07-24 VITALS — BP 122/76 | HR 65 | Temp 97.1°F

## 2012-07-24 DIAGNOSIS — C184 Malignant neoplasm of transverse colon: Secondary | ICD-10-CM

## 2012-07-24 DIAGNOSIS — C189 Malignant neoplasm of colon, unspecified: Secondary | ICD-10-CM

## 2012-07-24 MED ORDER — SODIUM CHLORIDE 0.9 % IJ SOLN
10.0000 mL | INTRAMUSCULAR | Status: DC | PRN
  Administered 2012-07-24: 10 mL
  Filled 2012-07-24: qty 10

## 2012-07-24 MED ORDER — HEPARIN SOD (PORK) LOCK FLUSH 100 UNIT/ML IV SOLN
500.0000 [IU] | Freq: Once | INTRAVENOUS | Status: AC | PRN
  Administered 2012-07-24: 500 [IU]
  Filled 2012-07-24: qty 5

## 2012-07-24 NOTE — Patient Instructions (Signed)
Call MD for problems and concerns.  Patient understands that her fatigue requires her to pace herself with ADL.

## 2012-07-25 ENCOUNTER — Ambulatory Visit: Payer: BC Managed Care – PPO | Admitting: Oncology

## 2012-07-29 ENCOUNTER — Ambulatory Visit: Payer: BC Managed Care – PPO | Admitting: Oncology

## 2012-08-05 ENCOUNTER — Ambulatory Visit (HOSPITAL_BASED_OUTPATIENT_CLINIC_OR_DEPARTMENT_OTHER): Payer: BC Managed Care – PPO | Admitting: Oncology

## 2012-08-05 ENCOUNTER — Encounter: Payer: Self-pay | Admitting: Oncology

## 2012-08-05 ENCOUNTER — Other Ambulatory Visit (HOSPITAL_BASED_OUTPATIENT_CLINIC_OR_DEPARTMENT_OTHER): Payer: BC Managed Care – PPO | Admitting: Lab

## 2012-08-05 ENCOUNTER — Ambulatory Visit (HOSPITAL_BASED_OUTPATIENT_CLINIC_OR_DEPARTMENT_OTHER): Payer: BC Managed Care – PPO

## 2012-08-05 VITALS — BP 142/79 | HR 79 | Temp 97.4°F | Resp 20 | Ht 69.0 in | Wt 183.3 lb

## 2012-08-05 DIAGNOSIS — D472 Monoclonal gammopathy: Secondary | ICD-10-CM

## 2012-08-05 DIAGNOSIS — C184 Malignant neoplasm of transverse colon: Secondary | ICD-10-CM

## 2012-08-05 DIAGNOSIS — C189 Malignant neoplasm of colon, unspecified: Secondary | ICD-10-CM

## 2012-08-05 DIAGNOSIS — Z5111 Encounter for antineoplastic chemotherapy: Secondary | ICD-10-CM

## 2012-08-05 LAB — CBC WITH DIFFERENTIAL/PLATELET
Basophils Absolute: 0 10*3/uL (ref 0.0–0.1)
Eosinophils Absolute: 0.2 10*3/uL (ref 0.0–0.5)
HCT: 40 % (ref 34.8–46.6)
HGB: 13.5 g/dL (ref 11.6–15.9)
LYMPH%: 44.2 % (ref 14.0–49.7)
MCHC: 33.8 g/dL (ref 31.5–36.0)
MONO#: 0.4 10*3/uL (ref 0.1–0.9)
NEUT#: 2.1 10*3/uL (ref 1.5–6.5)
NEUT%: 42.5 % (ref 38.4–76.8)
Platelets: 255 10*3/uL (ref 145–400)
WBC: 4.9 10*3/uL (ref 3.9–10.3)

## 2012-08-05 LAB — COMPREHENSIVE METABOLIC PANEL (CC13)
Albumin: 3.7 g/dL (ref 3.5–5.0)
BUN: 16.3 mg/dL (ref 7.0–26.0)
CO2: 24 mEq/L (ref 22–29)
Calcium: 8.9 mg/dL (ref 8.4–10.4)
Chloride: 103 mEq/L (ref 98–107)
Creatinine: 0.7 mg/dL (ref 0.6–1.1)
Glucose: 101 mg/dl — ABNORMAL HIGH (ref 70–99)
Potassium: 4.7 mEq/L (ref 3.5–5.1)

## 2012-08-05 MED ORDER — SODIUM CHLORIDE 0.9 % IV SOLN
Freq: Once | INTRAVENOUS | Status: AC
  Administered 2012-08-05: 10:00:00 via INTRAVENOUS

## 2012-08-05 MED ORDER — LEUCOVORIN CALCIUM INJECTION 350 MG
300.0000 mg/m2 | Freq: Once | INTRAVENOUS | Status: AC
  Administered 2012-08-05: 600 mg via INTRAVENOUS
  Filled 2012-08-05: qty 30

## 2012-08-05 MED ORDER — SODIUM CHLORIDE 0.9 % IV SOLN
1800.0000 mg/m2 | INTRAVENOUS | Status: DC
  Administered 2012-08-05: 3600 mg via INTRAVENOUS
  Filled 2012-08-05: qty 72

## 2012-08-05 MED ORDER — SODIUM CHLORIDE 0.9 % IJ SOLN
10.0000 mL | INTRAMUSCULAR | Status: DC | PRN
  Filled 2012-08-05: qty 10

## 2012-08-05 MED ORDER — HEPARIN SOD (PORK) LOCK FLUSH 100 UNIT/ML IV SOLN
500.0000 [IU] | Freq: Once | INTRAVENOUS | Status: DC | PRN
  Filled 2012-08-05: qty 5

## 2012-08-05 MED ORDER — FLUOROURACIL CHEMO INJECTION 2.5 GM/50ML
300.0000 mg/m2 | Freq: Once | INTRAVENOUS | Status: AC
  Administered 2012-08-05: 600 mg via INTRAVENOUS
  Filled 2012-08-05: qty 12

## 2012-08-05 NOTE — Patient Instructions (Addendum)
[facility] Discharge Instructions for Patients Receiving Chemotherapy  Today you received the following chemotherapy agents 72fu,leucovorin To help prevent nausea and vomiting after your treatment, we encourage you to take your nausea medication   Take it as often as prescribed.   If you develop nausea and vomiting that is not controlled by your nausea medication, call the clinic. If it is after clinic hours your family physician or the after hours number for the clinic or go to the Emergency Department.   BELOW ARE SYMPTOMS THAT SHOULD BE REPORTED IMMEDIATELY:  *FEVER GREATER THAN 100.5 F  *CHILLS WITH OR WITHOUT FEVER  NAUSEA AND VOMITING THAT IS NOT CONTROLLED WITH YOUR NAUSEA MEDICATION  *UNUSUAL SHORTNESS OF BREATH  *UNUSUAL BRUISING OR BLEEDING  TENDERNESS IN MOUTH AND THROAT WITH OR WITHOUT PRESENCE OF ULCERS  *URINARY PROBLEMS  *BOWEL PROBLEMS  UNUSUAL RASH Items with * indicate a potential emergency and should be followed up as soon as possible.  If this is your first treatment one of the nurses will contact you 24 hours after your treatment. Please let the nurse know about any problems that you may have experienced. Feel free to call the clinic you have any questions or concerns. The clinic phone number is [phone].   I have been informed and understand all the instructions given to me. I know to contact the clinic, my physician, or go to the Emergency Department if any problems should occur. I do not have any questions at this time, but understand that I may call the clinic during office hours   should I have any questions or need assistance in obtaining follow up care.    __________________________________________  _____________  __________ Signature of Patient or Authorized Representative            Date                   Time    __________________________________________ Nurse's Signature

## 2012-08-05 NOTE — Progress Notes (Signed)
[facility]  Telephone:(336) [phone] Fax:(336) [phone]   OFFICE PROGRESS NOTE   Cc:  PICKETT,KATHERINE, PA  DIAGNOSIS AND PAST THERAPY:  1. MGUS: Watchful observation.  2. Newly diagnosed pT3 N0 M0 transverse colon adenocarcinoma. S/p laparoscopic-assisted transverse colectomy by Dr. Dwain Sarna on 12/19/2011.   CURRENT THERAPY: started adjuvant Xeloda 2,000mg  PO BID in 02/2012.  She switched to IV 5FU in late 06/2012 due to poor toleration of Xeloda.   INTERVAL HISTORY: Rachel Clark 62 y.o. female returns for regular follow up with her husband.  She reports mild to moderate fatigue. She is able to care all activities of daily living. However after 2 PM, she needs to take a long nap every day.  She is trying her best  to eat and has maintained her weight.  She has some skin rash and redness in the bilateral feet; however there was no severe pain or skin sloughing off.  With IV 5-FU, she hasn't had problem with diarrhea or bowel pain or nausea vomiting.  Patient denies fever, anorexia, weight loss, headache, visual changes, confusion, drenching night sweats, palpable lymph node swelling, mucositis, odynophagia, dysphagia, nausea vomiting, jaundice, chest pain, palpitation, shortness of breath, dyspnea on exertion, productive cough, gum bleeding, epistaxis, hematemesis, hemoptysis, abdominal pain, abdominal swelling, early satiety, melena, hematochezia, hematuria, spontaneous bleeding, joint swelling, joint pain, heat or cold intolerance, bowel bladder incontinence, back pain, focal motor weakness, paresthesia, depression, suicidal or homicidal ideation, feeling hopelessness.    Past Medical History  Diagnosis Date  . Coronary artery disease     Has had remote NSTEMI with PCI to the RCA; S/P CABG in 2007; LAST CATH WITH MODERATE DISEASE IN A VERY SMALL DIAGONAL BRANCH; SHE IS MANAGED MEDICALLY  . Dyslipidemia   . Seizure disorder   . History of fibromyalgia   .  Chest pain   . Monoclonal gammopathy of unknown significance   . Colon cancer 12/04/2011    s/p Laparoscopic-assisted transverse colectomy on 12/19/2011 by Dr. Dwain Sarna.  pT3 N0 M0.   Marland Kitchen Hyperlipidemia   . Myocardial infarction 2007  . PONV (postoperative nausea and vomiting)   . Bronchitis     hx of;>24yrs  . Headache   . Seizures 12/06/11    last seizure over 40 yrs ago;takes Tegretol   . Fibromyalgia   . Arthritis     hands   . GERD (gastroesophageal reflux disease)     takes Pantoprazole daily  . Gastric ulcer   . Iron deficiency anemia 11/17/2011  . Hypothyroid     takes Levothyroxine daily  . Depression     takes Cymbalta daily  . Diarrhea 05/10/2012  . Diarrhea 05/10/2012    Past Surgical History  Procedure Date  . Partial hysterectomy 20+yrs ago  . Posterior laminectomy / decompression cervical spine     20+yrs ago  . Cardiac catheterization 05/20/2009    obstructive native vessel disease in LAD, RCA, and first diagonal, patent vein graft to distal RCA and LIMA to LAD,normal. ef 60%  . Colonoscopy   . Coronary artery bypass graft 2007    x 2  . Coronary angioplasty     1 stent  . Colon resection 12/19/2011    Procedure: COLON RESECTION LAPAROSCOPIC;  Surgeon: Emelia Loron, MD;  Location[facility] OR;  Service: General;  Laterality: N/A;  laparoscopic hand assisted partial colon resection  . Portacath placement 06/04/2012    Procedure: INSERTION PORT-A-CATH;  Surgeon: Emelia Loron, MD;  Location: [facility] OR;  Service: General;  Laterality: N/A;  Insertion of port-a-cath     Current Outpatient Prescriptions  Medication Sig Dispense Refill  . carbamazepine (TEGRETOL XR) 100 MG 12 hr tablet Take 100-200 mg by mouth 3 (three) times daily. Take 1 cap in the morning, 2 cap at lunch, and 2 cap in the evening      . diphenhydrAMINE (BENADRYL) 25 MG tablet Take 25 mg by mouth every 6 (six) hours as needed. For allergies.      . diphenoxylate-atropine (LOMOTIL) 2.5-0.025 MG per  tablet Take 1 tablet by mouth 4 (four) times daily as needed for diarrhea or loose stools.  30 tablet  0  . DULoxetine (CYMBALTA) 60 MG capsule Take 60 mg by mouth every morning.      Marland Kitchen estradiol (VIVELLE-DOT) 0.075 MG/24HR Place 1 patch onto the skin 2 (two) times a week. On Sundays and Wednesdays.      Marland Kitchen ethosuximide (ZARONTIN) 250 MG capsule Take 250-500 mg by mouth 3 (three) times daily. Take 1 tablet every morning, take 2 tablets every afternoon, and take 2 tablets every night at bedtime.      Marland Kitchen ezetimibe (ZETIA) 10 MG tablet Take 10 mg by mouth at bedtime.      Marland Kitchen HYDROcodone-acetaminophen (NORCO) 10-325 MG per tablet Take 1 tablet by mouth every 6 (six) hours as needed for pain.  10 tablet  0  . levothyroxine (SYNTHROID, LEVOTHROID) 50 MCG tablet Take 50 mcg by mouth daily.       Marland Kitchen lidocaine-prilocaine (EMLA) cream Apply topically as needed. Apply to [facility] site one hour prior to needle stick as needed to numb skin.  30 g  3  . nitroGLYCERIN (NITROSTAT) 0.4 MG SL tablet Place 0.4 mg under the tongue every 5 (five) minutes as needed. For chest pain.      Marland Kitchen ondansetron (ZOFRAN ODT) 8 MG disintegrating tablet Take 1 tablet (8 mg total) by mouth every 8 (eight) hours as needed for nausea.  20 tablet  0  . pantoprazole (PROTONIX) 40 MG tablet Take 1 tablet (40 mg total) by mouth every morning.  30 tablet  0  . prasugrel (EFFIENT) 10 MG TABS Take 10 mg by mouth every morning.      . Vitamin D, Ergocalciferol, (DRISDOL) 50000 UNITS CAPS Take 50,000 Units by mouth 2 (two) times a week. Pt takes twice weekly.  Monday and Wednesday        ALLERGIES:  is allergic to aspirin; erythromycin; imdur; lisinopril; niacin and related; penicillins; statins; sulfa drugs cross reactors; and tetracyclines & related.  REVIEW OF SYSTEMS:  The rest of the 14-point review of system was negative.   Filed Vitals:   08/05/12 0843  BP: 142/79  Pulse: 79  Temp: 97.4 F (36.3 C)  Resp: 20   Wt Readings from Last  3 Encounters:  08/05/12 183 lb 4.8 oz (83.144 kg)  07/08/12 181 lb 14.4 oz (82.509 kg)  06/25/12 184 lb (83.462 kg)   ECOG Performance status: 1  PHYSICAL EXAMINATION:   General:  well-nourished woman in no acute distress.  Eyes:  no scleral icterus.  ENT:  There were no oropharyngeal lesions.  Neck was without thyromegaly.  Lymphatics:  Negative cervical, supraclavicular or axillary adenopathy.  Respiratory: lungs were clear bilaterally without wheezing or crackles.  Cardiovascular:  Regular rate and rhythm, S1/S2, without murmur, rub or gallop.  There was no pedal edema.  GI:  abdomen was soft, flat, nontender, nondistended, without organomegaly.  Muscoloskeletal:  no spinal tenderness of palpation of vertebral spine.  Skin exam was without echymosis, petichae.  There was some dry skin in the bottom of her feet however there is no desquamation. Neuro exam was nonfocal.  Patient was able to get on and off exam table without assistance.  Gait was normal.  Patient was alerted and oriented.  Attention was good.   Language was appropriate.  Mood was normal without depression.  Speech was not pressured.  Thought content was not tangential.     LABORATORY/RADIOLOGY DATA:  Lab Results  Component Value Date   WBC 4.9 08/05/2012   HGB 13.5 08/05/2012   HCT 40.0 08/05/2012   PLT 255 08/05/2012   GLUCOSE 96 07/22/2012   CHOL 268* 08/17/2011   TRIG 107 08/17/2011   HDL 82 08/17/2011   LDLCALC 165* 08/17/2011   ALKPHOS 104 07/08/2012   ALT 17 07/08/2012   AST 18 07/08/2012   NA 137 07/22/2012   K 4.6 07/22/2012   CL 109* 07/22/2012   CREATININE 0.7 07/22/2012   BUN 19.0 07/22/2012   CO2 22 07/22/2012   INR 1.03 08/17/2011   HGBA1C 5.7* 08/16/2011    ASSESSMENT AND PLAN:   1. Diagnosis: Stage II, normal risk (not high risk) colon cancer. -Treatment:  On 5FU adjuvant chemotherapy.  She has grade 1 fatigue. She does not want to decrease the dose chemotherapy yet due to concern of possible decreased efficacy.  Chemotherapy will finish by the end of March 2014. I advised her to inform us if her fatigue significantly worsens in the future so that chemotherapy can be decreased.  2.   MGUS: SPEP and light chains were stable in Aug 2013. There is no evidence of progression to active myeloma. Will continue observation.  Next myeloma panel should be checked in Feb 2014 unless there are concerning signs for active myeloma. I have ordered an SPEP and light chains to be drawn on 08/19/12 with next chemo.  3.  Follow up: lab/chemo every 2 weeks; follow up every 4 weeks but sooner if concerning symptoms.   The length of time of the face-to-face encounter was 15 minutes. More than 50% of time was spent counseling and coordination of care.

## 2012-08-07 ENCOUNTER — Ambulatory Visit (HOSPITAL_BASED_OUTPATIENT_CLINIC_OR_DEPARTMENT_OTHER): Payer: BC Managed Care – PPO

## 2012-08-07 VITALS — BP 152/84 | HR 70 | Temp 98.0°F

## 2012-08-07 DIAGNOSIS — C184 Malignant neoplasm of transverse colon: Secondary | ICD-10-CM

## 2012-08-07 DIAGNOSIS — C189 Malignant neoplasm of colon, unspecified: Secondary | ICD-10-CM

## 2012-08-07 MED ORDER — SODIUM CHLORIDE 0.9 % IJ SOLN
10.0000 mL | INTRAMUSCULAR | Status: DC | PRN
  Administered 2012-08-07: 10 mL
  Filled 2012-08-07: qty 10

## 2012-08-07 MED ORDER — HEPARIN SOD (PORK) LOCK FLUSH 100 UNIT/ML IV SOLN
500.0000 [IU] | Freq: Once | INTRAVENOUS | Status: AC | PRN
  Administered 2012-08-07: 500 [IU]
  Filled 2012-08-07: qty 5

## 2012-08-07 NOTE — Patient Instructions (Signed)
Call MD for problems 

## 2012-08-19 ENCOUNTER — Ambulatory Visit (HOSPITAL_BASED_OUTPATIENT_CLINIC_OR_DEPARTMENT_OTHER): Payer: BC Managed Care – PPO

## 2012-08-19 ENCOUNTER — Other Ambulatory Visit (HOSPITAL_BASED_OUTPATIENT_CLINIC_OR_DEPARTMENT_OTHER): Payer: BC Managed Care – PPO | Admitting: Lab

## 2012-08-19 VITALS — BP 139/78 | HR 72 | Temp 97.7°F | Resp 16

## 2012-08-19 DIAGNOSIS — C184 Malignant neoplasm of transverse colon: Secondary | ICD-10-CM

## 2012-08-19 DIAGNOSIS — D472 Monoclonal gammopathy: Secondary | ICD-10-CM

## 2012-08-19 DIAGNOSIS — C189 Malignant neoplasm of colon, unspecified: Secondary | ICD-10-CM

## 2012-08-19 DIAGNOSIS — Z5111 Encounter for antineoplastic chemotherapy: Secondary | ICD-10-CM

## 2012-08-19 LAB — CBC WITH DIFFERENTIAL/PLATELET
Eosinophils Absolute: 0.2 10*3/uL (ref 0.0–0.5)
HCT: 40.7 % (ref 34.8–46.6)
LYMPH%: 39.3 % (ref 14.0–49.7)
MCHC: 33.4 g/dL (ref 31.5–36.0)
MCV: 94.2 fL (ref 79.5–101.0)
MONO#: 0.6 10*3/uL (ref 0.1–0.9)
NEUT#: 2.8 10*3/uL (ref 1.5–6.5)
NEUT%: 47.1 % (ref 38.4–76.8)
Platelets: 278 10*3/uL (ref 145–400)
WBC: 6 10*3/uL (ref 3.9–10.3)

## 2012-08-19 LAB — BASIC METABOLIC PANEL (CC13)
CO2: 23 mEq/L (ref 22–29)
Chloride: 106 mEq/L (ref 98–107)
Glucose: 99 mg/dl (ref 70–99)
Potassium: 4.4 mEq/L (ref 3.5–5.1)
Sodium: 136 mEq/L (ref 136–145)

## 2012-08-19 MED ORDER — FLUOROURACIL CHEMO INJECTION 2.5 GM/50ML
300.0000 mg/m2 | Freq: Once | INTRAVENOUS | Status: AC
  Administered 2012-08-19: 600 mg via INTRAVENOUS
  Filled 2012-08-19: qty 12

## 2012-08-19 MED ORDER — SODIUM CHLORIDE 0.9 % IV SOLN
Freq: Once | INTRAVENOUS | Status: AC
  Administered 2012-08-19: 09:00:00 via INTRAVENOUS

## 2012-08-19 MED ORDER — LEUCOVORIN CALCIUM INJECTION 350 MG
300.0000 mg/m2 | Freq: Once | INTRAVENOUS | Status: AC
  Administered 2012-08-19: 600 mg via INTRAVENOUS
  Filled 2012-08-19: qty 30

## 2012-08-19 MED ORDER — SODIUM CHLORIDE 0.9 % IV SOLN
1800.0000 mg/m2 | INTRAVENOUS | Status: DC
  Administered 2012-08-19: 3600 mg via INTRAVENOUS
  Filled 2012-08-19: qty 72

## 2012-08-19 NOTE — Patient Instructions (Addendum)
[facility] Discharge Instructions for Patients Receiving Chemotherapy  Today you received the following chemotherapy agents leucovoirn, 5FU and 5FU pump  To help prevent nausea and vomiting after your treatment, we encourage you to take your nausea medication if needed   If you develop nausea and vomiting that is not controlled by your nausea medication, call the clinic. If it is after clinic hours your family physician or the after hours number for the clinic or go to the Emergency Department.   BELOW ARE SYMPTOMS THAT SHOULD BE REPORTED IMMEDIATELY:  *FEVER GREATER THAN 100.5 F  *CHILLS WITH OR WITHOUT FEVER  NAUSEA AND VOMITING THAT IS NOT CONTROLLED WITH YOUR NAUSEA MEDICATION  *UNUSUAL SHORTNESS OF BREATH  *UNUSUAL BRUISING OR BLEEDING  TENDERNESS IN MOUTH AND THROAT WITH OR WITHOUT PRESENCE OF ULCERS  *URINARY PROBLEMS  *BOWEL PROBLEMS  UNUSUAL RASH Items with * indicate a potential emergency and should be followed up as soon as possible.   Feel free to call the clinic you have any questions or concerns. The clinic phone number is [phone].   I have been informed and understand all the instructions given to me. I know to contact the clinic, my physician, or go to the Emergency Department if any problems should occur. I do not have any questions at this time, but understand that I may call the clinic during office hours   should I have any questions or need assistance in obtaining follow up care.    __________________________________________  _____________  __________ Signature of Patient or Authorized Representative            Date                   Time    __________________________________________ Nurse's Signature

## 2012-08-21 ENCOUNTER — Ambulatory Visit (HOSPITAL_BASED_OUTPATIENT_CLINIC_OR_DEPARTMENT_OTHER): Payer: BC Managed Care – PPO

## 2012-08-21 VITALS — BP 122/80 | HR 86 | Temp 97.6°F

## 2012-08-21 DIAGNOSIS — C189 Malignant neoplasm of colon, unspecified: Secondary | ICD-10-CM

## 2012-08-21 DIAGNOSIS — C184 Malignant neoplasm of transverse colon: Secondary | ICD-10-CM

## 2012-08-21 LAB — PROTEIN ELECTROPHORESIS, SERUM
Alpha-1-Globulin: 4.1 % (ref 2.9–4.9)
Alpha-2-Globulin: 10.2 % (ref 7.1–11.8)
Total Protein, Serum Electrophoresis: 7 g/dL (ref 6.0–8.3)

## 2012-08-21 LAB — KAPPA/LAMBDA LIGHT CHAINS: Kappa:Lambda Ratio: 1.26 (ref 0.26–1.65)

## 2012-08-21 MED ORDER — HEPARIN SOD (PORK) LOCK FLUSH 100 UNIT/ML IV SOLN
500.0000 [IU] | Freq: Once | INTRAVENOUS | Status: AC | PRN
  Administered 2012-08-21: 500 [IU]
  Filled 2012-08-21: qty 5

## 2012-08-21 MED ORDER — SODIUM CHLORIDE 0.9 % IJ SOLN
10.0000 mL | INTRAMUSCULAR | Status: DC | PRN
  Administered 2012-08-21: 10 mL
  Filled 2012-08-21: qty 10

## 2012-08-21 NOTE — Patient Instructions (Signed)
Call MD with any questions or concerns. Phone is [phone].

## 2012-08-22 ENCOUNTER — Telehealth: Payer: Self-pay | Admitting: Oncology

## 2012-08-22 NOTE — Telephone Encounter (Signed)
s.w. pt and r/s Lab and est for 2.27.14.Marland KitchenMarland Kitchenpt ok and aware

## 2012-08-26 ENCOUNTER — Other Ambulatory Visit: Payer: Self-pay

## 2012-08-29 ENCOUNTER — Telehealth: Payer: Self-pay | Admitting: Oncology

## 2012-08-29 ENCOUNTER — Ambulatory Visit (HOSPITAL_BASED_OUTPATIENT_CLINIC_OR_DEPARTMENT_OTHER): Payer: BC Managed Care – PPO | Admitting: Oncology

## 2012-08-29 ENCOUNTER — Other Ambulatory Visit (HOSPITAL_BASED_OUTPATIENT_CLINIC_OR_DEPARTMENT_OTHER): Payer: BC Managed Care – PPO | Admitting: Lab

## 2012-08-29 ENCOUNTER — Telehealth: Payer: Self-pay | Admitting: *Deleted

## 2012-08-29 VITALS — BP 131/80 | HR 85 | Temp 96.8°F | Resp 20 | Ht 69.0 in | Wt 180.6 lb

## 2012-08-29 DIAGNOSIS — C189 Malignant neoplasm of colon, unspecified: Secondary | ICD-10-CM

## 2012-08-29 DIAGNOSIS — C184 Malignant neoplasm of transverse colon: Secondary | ICD-10-CM

## 2012-08-29 DIAGNOSIS — D472 Monoclonal gammopathy: Secondary | ICD-10-CM

## 2012-08-29 LAB — CBC WITH DIFFERENTIAL/PLATELET
Basophils Absolute: 0 10*3/uL (ref 0.0–0.1)
Eosinophils Absolute: 0.1 10*3/uL (ref 0.0–0.5)
HGB: 13.5 g/dL (ref 11.6–15.9)
MCV: 94.6 fL (ref 79.5–101.0)
MONO#: 0.5 10*3/uL (ref 0.1–0.9)
MONO%: 7.8 % (ref 0.0–14.0)
NEUT#: 3.1 10*3/uL (ref 1.5–6.5)
Platelets: 290 10*3/uL (ref 145–400)
RDW: 14.3 % (ref 11.2–14.5)
WBC: 6 10*3/uL (ref 3.9–10.3)

## 2012-08-29 LAB — COMPREHENSIVE METABOLIC PANEL (CC13)
Albumin: 3.6 g/dL (ref 3.5–5.0)
Alkaline Phosphatase: 105 U/L (ref 40–150)
BUN: 18.6 mg/dL (ref 7.0–26.0)
CO2: 24 mEq/L (ref 22–29)
Calcium: 8.7 mg/dL (ref 8.4–10.4)
Glucose: 107 mg/dl — ABNORMAL HIGH (ref 70–99)
Potassium: 4.5 mEq/L (ref 3.5–5.1)
Total Protein: 7.3 g/dL (ref 6.4–8.3)

## 2012-08-29 NOTE — Progress Notes (Signed)
[facility]  Telephone:([phone] [phone]   OFFICE PROGRESS NOTE   Cc:  PICKETT,KATHERINE, PA  DIAGNOSIS AND PAST THERAPY:  1. MGUS: Watchful observation.  2. Newly diagnosed pT3 N0 M0 transverse colon adenocarcinoma. S/p laparoscopic-assisted transverse colectomy by Dr. Dwain Sarna on 12/19/2011.   CURRENT THERAPY: started adjuvant Xeloda 2,000mg  PO BID in 02/2012.  She switched to IV 5FU in late 06/2012 due to poor toleration of Xeloda.   INTERVAL HISTORY: Rachel Clark 62 y.o. female returns for regular follow up with her husband.  She reports feeling relatively well.  She has bad and good days.  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  She is active and is independent of all activities of daily living.  She sometimes pushes herself too hard; and the next day, she feels tired.  She denied fever, mucositis, chest pain, skin rash, diarrhea, GI bleeding.  The rest of the 14-point review of system ws negative.    Past Medical History  Diagnosis Date  . Coronary artery disease     Has had remote NSTEMI with PCI to the RCA; S/P CABG in 2007; LAST CATH WITH MODERATE DISEASE IN A VERY SMALL DIAGONAL BRANCH; SHE IS MANAGED MEDICALLY  . Dyslipidemia   . Seizure disorder   . History of fibromyalgia   . Chest pain   . Monoclonal gammopathy of unknown significance   . Colon cancer 12/04/2011    s/p Laparoscopic-assisted transverse colectomy on 12/19/2011 by Dr. Dwain Sarna.  pT3 N0 M0.   Marland Kitchen Hyperlipidemia   . Myocardial infarction 2007  . PONV (postoperative nausea and vomiting)   . Bronchitis     hx of;>16yrs  . Headache   . Seizures 12/06/11    last seizure over 40 yrs ago;takes Tegretol   . Fibromyalgia   . Arthritis     hands   . GERD (gastroesophageal reflux disease)     takes Pantoprazole daily  . Gastric ulcer   . Iron deficiency anemia 11/17/2011  . Hypothyroid     takes Levothyroxine daily  .  Depression     takes Cymbalta daily  . Diarrhea 05/10/2012  . Diarrhea 05/10/2012    Past Surgical History  Procedure Laterality Date  . Partial hysterectomy  20+yrs ago  . Posterior laminectomy / decompression cervical spine      20+yrs ago  . Cardiac catheterization  05/20/2009    obstructive native vessel disease in LAD, RCA, and first diagonal, patent vein graft to distal RCA and LIMA to LAD,normal. ef 60%  . Colonoscopy    . Coronary artery bypass graft  2007    x 2  . Coronary angioplasty      1 stent  . Colon resection  12/19/2011    Procedure: COLON RESECTION LAPAROSCOPIC;  Surgeon: Emelia Loron, MD;  Location: [facility] OR;  Service: General;  Laterality: N/A;  laparoscopic hand assisted partial colon resection  . Portacath placement  06/04/2012    Procedure: INSERTION PORT-A-CATH;  Surgeon: Emelia Loron, MD;  Location: [facility] OR;  Service: General;  Laterality: N/A;  Insertion of port-a-cath     Current Outpatient Prescriptions  Medication Sig Dispense Refill  . carbamazepine (TEGRETOL XR) 100 MG 12 hr tablet Take 100-200 mg by mouth 3 (three) times daily. Take 1 cap in the morning, 2 cap at lunch, and 2 cap in the evening      . diphenhydrAMINE (BENADRYL)  25 MG tablet Take 25 mg by mouth every 6 (six) hours as needed. For allergies.      . diphenoxylate-atropine (LOMOTIL) 2.5-0.025 MG per tablet Take 1 tablet by mouth 4 (four) times daily as needed for diarrhea or loose stools.  30 tablet  0  . DULoxetine (CYMBALTA) 60 MG capsule Take 60 mg by mouth every morning.      Marland Kitchen estradiol (VIVELLE-DOT) 0.075 MG/24HR Place 1 patch onto the skin 2 (two) times a week. On Sundays and Wednesdays.      Marland Kitchen ethosuximide (ZARONTIN) 250 MG capsule Take 250-500 mg by mouth 3 (three) times daily. Take 1 tablet every morning, take 2 tablets every afternoon, and take 2 tablets every night at bedtime.      Marland Kitchen ezetimibe (ZETIA) 10 MG tablet Take 10 mg by mouth at bedtime.      Marland Kitchen  HYDROcodone-acetaminophen (NORCO) 10-325 MG per tablet Take 1 tablet by mouth every 6 (six) hours as needed for pain.  10 tablet  0  . levothyroxine (SYNTHROID, LEVOTHROID) 50 MCG tablet Take 50 mcg by mouth daily.       Marland Kitchen lidocaine-prilocaine (EMLA) cream Apply topically as needed. Apply to [facility] site one hour prior to needle stick as needed to numb skin.  30 g  3  . nitroGLYCERIN (NITROSTAT) 0.4 MG SL tablet Place 0.4 mg under the tongue every 5 (five) minutes as needed. For chest pain.      Marland Kitchen ondansetron (ZOFRAN ODT) 8 MG disintegrating tablet Take 1 tablet (8 mg total) by mouth every 8 (eight) hours as needed for nausea.  20 tablet  0  . pantoprazole (PROTONIX) 40 MG tablet Take 1 tablet (40 mg total) by mouth every morning.  30 tablet  0  . prasugrel (EFFIENT) 10 MG TABS Take 10 mg by mouth every morning.      . Vitamin D, Ergocalciferol, (DRISDOL) 50000 UNITS CAPS Take 50,000 Units by mouth 2 (two) times a week. Pt takes twice weekly.  Monday and Wednesday       No current facility-administered medications for this visit.    ALLERGIES:  is allergic to aspirin; erythromycin; imdur; lisinopril; niacin and related; penicillins; statins; sulfa drugs cross reactors; and tetracyclines & related.  REVIEW OF SYSTEMS:  The rest of the 14-point review of system was negative.   Filed Vitals:   08/29/12 0836  BP: 131/80  Pulse: 85  Temp: 96.8 F (36 C)  Resp: 20   Wt Readings from Last 3 Encounters:  08/29/12 180 lb 9.6 oz (81.92 kg)  08/05/12 183 lb 4.8 oz (83.144 kg)  07/08/12 181 lb 14.4 oz (82.509 kg)   ECOG Performance status: 1  PHYSICAL EXAMINATION:   General:  well-nourished woman in no acute distress.  Eyes:  no scleral icterus.  ENT:  There were no oropharyngeal lesions.  Neck was without thyromegaly.  Lymphatics:  Negative cervical, supraclavicular or axillary adenopathy.  Respiratory: lungs were clear bilaterally without wheezing or crackles.  Cardiovascular:  Regular rate  and rhythm, S1/S2, without murmur, rub or gallop.  There was no pedal edema.  GI:  abdomen was soft, flat, nontender, nondistended, without organomegaly.  Muscoloskeletal:  no spinal tenderness of palpation of vertebral spine.  Skin exam was without echymosis, petichae. Neuro exam was nonfocal.  Patient was able to get on and off exam table without assistance.  Gait was normal.  Patient was alerted and oriented.  Attention was good.   Language was appropriate.  Mood  was normal without depression.  Speech was not pressured.  Thought content was not tangential.     LABORATORY/RADIOLOGY DATA:  Lab Results  Component Value Date   WBC 6.0 08/29/2012   HGB 13.5 08/29/2012   HCT 39.4 08/29/2012   PLT 290 08/29/2012   GLUCOSE 107* 08/29/2012   CHOL 268* 08/17/2011   TRIG 107 08/17/2011   HDL 82 08/17/2011   LDLCALC 165* 08/17/2011   ALKPHOS 105 08/29/2012   ALT 10 08/29/2012   AST 12 08/29/2012   NA 137 08/29/2012   K 4.5 08/29/2012   CL 106 08/29/2012   CREATININE 0.7 08/29/2012   BUN 18.6 08/29/2012   CO2 24 08/29/2012   INR 1.03 08/17/2011   HGBA1C 5.7* 08/16/2011    ASSESSMENT AND PLAN:   1. Diagnosis: Stage II, normal risk (not high risk) colon cancer. -Treatment:  On 5FU adjuvant chemotherapy.  She still has grade 1 fatigue.  I recommended two more doses of 5FU at the same dose as last cycle (meaning 75% of baseline).  Her fatigue is stable and does not warrant further dose reduction.    2.   MGUS: SPEP and light chains are still stable with latest testing in 08/2012. Marland Kitchen There is no evidence of progression to active myeloma. Will continue observation.  Next myeloma panel should be checked around June-August 2014 unless there are concerning signs for active myeloma.    3.  Follow up: lab/chemo every 2 weeks; follow up with me in about 4 weeks on 09/30/12.    The length of time of the face-to-face encounter was 15 minutes. More than 50% of time was spent counseling and coordination of care.

## 2012-08-29 NOTE — Patient Instructions (Signed)
1.  Diagnosis:  Colon cancer. 2.  Treatment:  Adjuvant chemo Xeloda due to finish end of March. 3 . Follow up:  Lab-only appointment in about 3 weeks.  Lab/RV with me in about 6 weeks.

## 2012-08-29 NOTE — Telephone Encounter (Signed)
Talked to pt and gave her appt for March 2014 lab MD and chemo

## 2012-08-29 NOTE — Telephone Encounter (Signed)
Per staff message and POF I have scheduled appts.  JMW  

## 2012-08-29 NOTE — Telephone Encounter (Signed)
GAve pt appt for lab and MD, emailed michelle regarding chemo for MArch 2014

## 2012-09-02 ENCOUNTER — Ambulatory Visit (HOSPITAL_BASED_OUTPATIENT_CLINIC_OR_DEPARTMENT_OTHER): Payer: BC Managed Care – PPO

## 2012-09-02 ENCOUNTER — Other Ambulatory Visit: Payer: BC Managed Care – PPO | Admitting: Lab

## 2012-09-02 ENCOUNTER — Ambulatory Visit: Payer: BC Managed Care – PPO | Admitting: Oncology

## 2012-09-02 ENCOUNTER — Other Ambulatory Visit: Payer: Self-pay | Admitting: *Deleted

## 2012-09-02 ENCOUNTER — Ambulatory Visit (HOSPITAL_COMMUNITY)
Admission: RE | Admit: 2012-09-02 | Discharge: 2012-09-02 | Disposition: A | Discharge status: Home / Self Care | Payer: BC Managed Care – PPO | Source: Ambulatory Visit | Attending: Oncology | Admitting: Oncology

## 2012-09-02 VITALS — BP 144/63 | HR 65 | Temp 98.5°F | Resp 18

## 2012-09-02 DIAGNOSIS — C184 Malignant neoplasm of transverse colon: Secondary | ICD-10-CM

## 2012-09-02 DIAGNOSIS — C189 Malignant neoplasm of colon, unspecified: Secondary | ICD-10-CM

## 2012-09-02 DIAGNOSIS — Y849 Medical procedure, unspecified as the cause of abnormal reaction of the patient, or of later complication, without mention of misadventure at the time of the procedure: Secondary | ICD-10-CM | POA: Insufficient documentation

## 2012-09-02 DIAGNOSIS — T82898A Other specified complication of vascular prosthetic devices, implants and grafts, initial encounter: Secondary | ICD-10-CM | POA: Insufficient documentation

## 2012-09-02 DIAGNOSIS — Z5111 Encounter for antineoplastic chemotherapy: Secondary | ICD-10-CM

## 2012-09-02 MED ORDER — HEPARIN SOD (PORK) LOCK FLUSH 100 UNIT/ML IV SOLN
500.0000 [IU] | Freq: Once | INTRAVENOUS | Status: DC | PRN
  Filled 2012-09-02: qty 5

## 2012-09-02 MED ORDER — SODIUM CHLORIDE 0.9 % IJ SOLN
10.0000 mL | INTRAMUSCULAR | Status: DC | PRN
  Filled 2012-09-02: qty 10

## 2012-09-02 MED ORDER — FLUOROURACIL CHEMO INJECTION 2.5 GM/50ML
300.0000 mg/m2 | Freq: Once | INTRAVENOUS | Status: AC
  Administered 2012-09-02: 600 mg via INTRAVENOUS
  Filled 2012-09-02: qty 12

## 2012-09-02 MED ORDER — SODIUM CHLORIDE 0.9 % IV SOLN
Freq: Once | INTRAVENOUS | Status: AC
  Administered 2012-09-02: 10:00:00 via INTRAVENOUS

## 2012-09-02 MED ORDER — IOHEXOL 300 MG/ML  SOLN
10.0000 mL | Freq: Once | INTRAMUSCULAR | Status: AC | PRN
  Administered 2012-09-02: 10 mL via INTRAVENOUS

## 2012-09-02 MED ORDER — SODIUM CHLORIDE 0.9 % IV SOLN
1800.0000 mg/m2 | INTRAVENOUS | Status: DC
  Administered 2012-09-02: 3600 mg via INTRAVENOUS
  Filled 2012-09-02: qty 72

## 2012-09-02 MED ORDER — LEUCOVORIN CALCIUM INJECTION 350 MG
300.0000 mg/m2 | Freq: Once | INTRAVENOUS | Status: AC
  Administered 2012-09-02: 600 mg via INTRAVENOUS
  Filled 2012-09-02: qty 30

## 2012-09-02 NOTE — Progress Notes (Signed)
@  1150, pt returned form interventional readiology, port reaccess by radiology and per radiologist ok to use. (report from [employer] charge rn)   dmr

## 2012-09-02 NOTE — Progress Notes (Signed)
@  1150 prt checked, brisk blood return, infusion restarted.  (ok'd by pharmacy as stability)  dmr

## 2012-09-02 NOTE — Patient Instructions (Addendum)
[facility] Discharge Instructions for Patients Receiving Chemotherapy  Today you received the following chemotherapy agents aduracil/leucovorin. 5FU  To help prevent nausea and vomiting after your treatment, we encourage you to take your nausea medication  and take it as often as prescribed   If you develop nausea and vomiting that is not controlled by your nausea medication, call the clinic. If it is after clinic hours your family physician or the after hours number for the clinic or go to the Emergency Department.   BELOW ARE SYMPTOMS THAT SHOULD BE REPORTED IMMEDIATELY:  *FEVER GREATER THAN 100.5 F  *CHILLS WITH OR WITHOUT FEVER  NAUSEA AND VOMITING THAT IS NOT CONTROLLED WITH YOUR NAUSEA MEDICATION  *UNUSUAL SHORTNESS OF BREATH  *UNUSUAL BRUISING OR BLEEDING  TENDERNESS IN MOUTH AND THROAT WITH OR WITHOUT PRESENCE OF ULCERS  *URINARY PROBLEMS  *BOWEL PROBLEMS  UNUSUAL RASH Items with * indicate a potential emergency and should be followed up as soon as possible.  One of the nurses will contact you 24 hours after your treatment. Please let the nurse know about any problems that you may have experienced. Feel free to call the clinic you have any questions or concerns. The clinic phone number is [phone].   I have been informed and understand all the instructions given to me. I know to contact the clinic, my physician, or go to the Emergency Department if any problems should occur. I do not have any questions at this time, but understand that I may call the clinic during office hours   should I have any questions or need assistance in obtaining follow up care.    __________________________________________  _____________  __________ Signature of Patient or Authorized Representative            Date                   Time    __________________________________________ Nurse's Signature

## 2012-09-02 NOTE — Progress Notes (Signed)
@  1010 pt complained of burning around port site.  Infusion stopped, blood return checked with brisk blood return.  Pt still c/o burning with nothing infusing.. Charge rn stephanie johnson, notified and helen lee rn.  Both ckecked port with good blood return and burning with fllush of ns.  Pt sent to radiology for study.  dmr

## 2012-09-03 ENCOUNTER — Encounter: Payer: Self-pay | Admitting: *Deleted

## 2012-09-04 ENCOUNTER — Ambulatory Visit (HOSPITAL_BASED_OUTPATIENT_CLINIC_OR_DEPARTMENT_OTHER): Payer: BC Managed Care – PPO

## 2012-09-04 VITALS — BP 130/73 | HR 73 | Temp 97.6°F | Resp 18

## 2012-09-04 DIAGNOSIS — C184 Malignant neoplasm of transverse colon: Secondary | ICD-10-CM

## 2012-09-04 DIAGNOSIS — C189 Malignant neoplasm of colon, unspecified: Secondary | ICD-10-CM

## 2012-09-04 MED ORDER — HEPARIN SOD (PORK) LOCK FLUSH 100 UNIT/ML IV SOLN
500.0000 [IU] | Freq: Once | INTRAVENOUS | Status: AC | PRN
  Administered 2012-09-04: 500 [IU]
  Filled 2012-09-04: qty 5

## 2012-09-04 MED ORDER — SODIUM CHLORIDE 0.9 % IJ SOLN
10.0000 mL | INTRAMUSCULAR | Status: DC | PRN
  Administered 2012-09-04: 10 mL
  Filled 2012-09-04: qty 10

## 2012-09-04 NOTE — Patient Instructions (Signed)
Call MD for  Problems or concerns

## 2012-09-16 ENCOUNTER — Other Ambulatory Visit (HOSPITAL_BASED_OUTPATIENT_CLINIC_OR_DEPARTMENT_OTHER): Payer: BC Managed Care – PPO | Admitting: Lab

## 2012-09-16 ENCOUNTER — Ambulatory Visit (HOSPITAL_BASED_OUTPATIENT_CLINIC_OR_DEPARTMENT_OTHER): Payer: BC Managed Care – PPO

## 2012-09-16 ENCOUNTER — Ambulatory Visit: Payer: BC Managed Care – PPO

## 2012-09-16 VITALS — BP 130/79 | HR 65 | Temp 96.9°F

## 2012-09-16 DIAGNOSIS — C189 Malignant neoplasm of colon, unspecified: Secondary | ICD-10-CM

## 2012-09-16 DIAGNOSIS — C184 Malignant neoplasm of transverse colon: Secondary | ICD-10-CM

## 2012-09-16 DIAGNOSIS — C20 Malignant neoplasm of rectum: Secondary | ICD-10-CM

## 2012-09-16 DIAGNOSIS — Z5111 Encounter for antineoplastic chemotherapy: Secondary | ICD-10-CM

## 2012-09-16 LAB — CBC WITH DIFFERENTIAL/PLATELET
BASO%: 0.6 % (ref 0.0–2.0)
Basophils Absolute: 0 10*3/uL (ref 0.0–0.1)
EOS%: 4.5 % (ref 0.0–7.0)
HCT: 39.2 % (ref 34.8–46.6)
HGB: 12.9 g/dL (ref 11.6–15.9)
LYMPH%: 41.4 % (ref 14.0–49.7)
MCH: 31.3 pg (ref 25.1–34.0)
MCHC: 32.9 g/dL (ref 31.5–36.0)
MCV: 95.2 fL (ref 79.5–101.0)
MONO%: 7.8 % (ref 0.0–14.0)
NEUT%: 45.7 % (ref 38.4–76.8)
Platelets: 249 10*3/uL (ref 145–400)
lymph#: 2 10*3/uL (ref 0.9–3.3)

## 2012-09-16 MED ORDER — LEUCOVORIN CALCIUM INJECTION 350 MG
300.0000 mg/m2 | Freq: Once | INTRAVENOUS | Status: AC
  Administered 2012-09-16: 600 mg via INTRAVENOUS
  Filled 2012-09-16: qty 30

## 2012-09-16 MED ORDER — FLUOROURACIL CHEMO INJECTION 2.5 GM/50ML
300.0000 mg/m2 | Freq: Once | INTRAVENOUS | Status: AC
  Administered 2012-09-16: 600 mg via INTRAVENOUS
  Filled 2012-09-16: qty 12

## 2012-09-16 MED ORDER — SODIUM CHLORIDE 0.9 % IV SOLN
Freq: Once | INTRAVENOUS | Status: AC
  Administered 2012-09-16: 10:00:00 via INTRAVENOUS

## 2012-09-16 MED ORDER — SODIUM CHLORIDE 0.9 % IV SOLN
1800.0000 mg/m2 | INTRAVENOUS | Status: DC
  Administered 2012-09-16: 3600 mg via INTRAVENOUS
  Filled 2012-09-16: qty 72

## 2012-09-16 NOTE — Patient Instructions (Addendum)
[facility] Discharge Instructions for Patients Receiving Chemotherapy  Today you received the following chemotherapy agents Leucovorin/5FU  To help prevent nausea and vomiting after your treatment, we encourage you to take your nausea medication as prescribed.   If you develop nausea and vomiting that is not controlled by your nausea medication, call the clinic. If it is after clinic hours your family physician or the after hours number for the clinic or go to the Emergency Department.   BELOW ARE SYMPTOMS THAT SHOULD BE REPORTED IMMEDIATELY:  *FEVER GREATER THAN 100.5 F  *CHILLS WITH OR WITHOUT FEVER  NAUSEA AND VOMITING THAT IS NOT CONTROLLED WITH YOUR NAUSEA MEDICATION  *UNUSUAL SHORTNESS OF BREATH  *UNUSUAL BRUISING OR BLEEDING  TENDERNESS IN MOUTH AND THROAT WITH OR WITHOUT PRESENCE OF ULCERS  *URINARY PROBLEMS  *BOWEL PROBLEMS  UNUSUAL RASH Items with * indicate a potential emergency and should be followed up as soon as possible.  Feel free to call the clinic you have any questions or concerns. The clinic phone number is [phone].   I have been informed and understand all the instructions given to me. I know to contact the clinic, my physician, or go to the Emergency Department if any problems should occur. I do not have any questions at this time, but understand that I may call the clinic during office hours   should I have any questions or need assistance in obtaining follow up care.    __________________________________________  _____________  __________ Signature of Patient or Authorized Representative            Date                   Time    __________________________________________ Nurse's Signature    

## 2012-09-18 ENCOUNTER — Ambulatory Visit (HOSPITAL_BASED_OUTPATIENT_CLINIC_OR_DEPARTMENT_OTHER): Payer: BC Managed Care – PPO

## 2012-09-18 VITALS — BP 122/74 | HR 79 | Temp 97.3°F

## 2012-09-18 DIAGNOSIS — C184 Malignant neoplasm of transverse colon: Secondary | ICD-10-CM

## 2012-09-18 DIAGNOSIS — Z452 Encounter for adjustment and management of vascular access device: Secondary | ICD-10-CM

## 2012-09-18 DIAGNOSIS — C189 Malignant neoplasm of colon, unspecified: Secondary | ICD-10-CM

## 2012-09-18 MED ORDER — SODIUM CHLORIDE 0.9 % IJ SOLN
10.0000 mL | INTRAMUSCULAR | Status: DC | PRN
  Administered 2012-09-18: 10 mL
  Filled 2012-09-18: qty 10

## 2012-09-18 MED ORDER — HEPARIN SOD (PORK) LOCK FLUSH 100 UNIT/ML IV SOLN
500.0000 [IU] | Freq: Once | INTRAVENOUS | Status: AC | PRN
  Administered 2012-09-18: 500 [IU]
  Filled 2012-09-18: qty 5

## 2012-09-30 ENCOUNTER — Other Ambulatory Visit (HOSPITAL_BASED_OUTPATIENT_CLINIC_OR_DEPARTMENT_OTHER): Payer: BC Managed Care – PPO | Admitting: Lab

## 2012-09-30 ENCOUNTER — Telehealth: Payer: Self-pay | Admitting: Oncology

## 2012-09-30 ENCOUNTER — Ambulatory Visit (HOSPITAL_BASED_OUTPATIENT_CLINIC_OR_DEPARTMENT_OTHER): Payer: BC Managed Care – PPO | Admitting: Oncology

## 2012-09-30 VITALS — BP 131/85 | HR 80 | Temp 96.8°F | Resp 18 | Ht 69.0 in | Wt 176.3 lb

## 2012-09-30 DIAGNOSIS — C189 Malignant neoplasm of colon, unspecified: Secondary | ICD-10-CM

## 2012-09-30 DIAGNOSIS — C184 Malignant neoplasm of transverse colon: Secondary | ICD-10-CM

## 2012-09-30 DIAGNOSIS — D472 Monoclonal gammopathy: Secondary | ICD-10-CM

## 2012-09-30 LAB — COMPREHENSIVE METABOLIC PANEL (CC13)
CO2: 22 mEq/L (ref 22–29)
Creatinine: 0.8 mg/dL (ref 0.6–1.1)
Glucose: 112 mg/dl — ABNORMAL HIGH (ref 70–99)
Total Bilirubin: 0.33 mg/dL (ref 0.20–1.20)
Total Protein: 7.2 g/dL (ref 6.4–8.3)

## 2012-09-30 LAB — CBC WITH DIFFERENTIAL/PLATELET
Basophils Absolute: 0 10*3/uL (ref 0.0–0.1)
Eosinophils Absolute: 0.2 10*3/uL (ref 0.0–0.5)
HCT: 41.4 % (ref 34.8–46.6)
LYMPH%: 44 % (ref 14.0–49.7)
MONO#: 0.5 10*3/uL (ref 0.1–0.9)
NEUT#: 2.4 10*3/uL (ref 1.5–6.5)
NEUT%: 43.6 % (ref 38.4–76.8)
Platelets: 274 10*3/uL (ref 145–400)
WBC: 5.6 10*3/uL (ref 3.9–10.3)

## 2012-09-30 NOTE — Patient Instructions (Addendum)
1. History of colon cancer:  Finished chemo. 2.  Follow up plan:  *  Surveillance colonoscopy by 12/2012.  *  CT chest/abdomen within 1 month. 3.  History of MGUS (monoclonal gammopathy of unknown significance):  No evidence of disease progression to myeloma.  Follow up with blood test in about 4 months.

## 2012-09-30 NOTE — Progress Notes (Signed)
[facility]  Telephone:(336) [phone] Fax:(336) [phone]   OFFICE PROGRESS NOTE   Cc:  PICKETT,KATHERINE, PA-C  DIAGNOSIS AND PAST THERAPY:  1. MGUS: Watchful observation.  2. Newly diagnosed pT3 N0 M0 transverse colon adenocarcinoma. S/p laparoscopic-assisted transverse colectomy by Dr. Dwain Sarna on 12/19/2011.   CURRENT THERAPY: started adjuvant Xeloda 2,000mg  PO BID in 02/2012.  She switched to IV 5FU in late 06/2012 due to poor toleration of Xeloda.   INTERVAL HISTORY: Rachel Clark 62 y.o. female returns for regular follow up with her husband.  She still has moderate fatigue.  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  She denied fever, mucositis, nausea/vomiting, SOB, chest pain, diarrhea, skin rash, abdominal pain, bowel/bladder incontinence, lower back pain, paresthesia.    Past Medical History  Diagnosis Date  . Coronary artery disease     Has had remote NSTEMI with PCI to the RCA; S/P CABG in 2007; LAST CATH WITH MODERATE DISEASE IN A VERY SMALL DIAGONAL BRANCH; SHE IS MANAGED MEDICALLY  . Dyslipidemia   . Seizure disorder   . History of fibromyalgia   . Chest pain   . Monoclonal gammopathy of unknown significance   . Colon cancer 12/04/2011    s/p Laparoscopic-assisted transverse colectomy on 12/19/2011 by Dr. Dwain Sarna.  pT3 N0 M0.   Marland Kitchen Hyperlipidemia   . Myocardial infarction 2007  . PONV (postoperative nausea and vomiting)   . Bronchitis     hx of;>43yrs  . Headache   . Seizures 12/06/11    last seizure over 40 yrs ago;takes Tegretol   . Fibromyalgia   . Arthritis     hands   . GERD (gastroesophageal reflux disease)     takes Pantoprazole daily  . Gastric ulcer   . Iron deficiency anemia 11/17/2011  . Hypothyroid     takes Levothyroxine daily  . Depression     takes Cymbalta daily  . Diarrhea 05/10/2012  . Diarrhea 05/10/2012    Past Surgical History  Procedure Laterality Date  . Partial hysterectomy  20+yrs ago  . Posterior  laminectomy / decompression cervical spine      20+yrs ago  . Cardiac catheterization  05/20/2009    obstructive native vessel disease in LAD, RCA, and first diagonal, patent vein graft to distal RCA and LIMA to LAD,normal. ef 60%  . Colonoscopy    . Coronary artery bypass graft  2007    x 2  . Coronary angioplasty      1 stent  . Colon resection  12/19/2011    Procedure: COLON RESECTION LAPAROSCOPIC;  Surgeon: Emelia Loron, MD;  Location: [facility] OR;  Service: General;  Laterality: N/A;  laparoscopic hand assisted partial colon resection  . Portacath placement  06/04/2012    Procedure: INSERTION PORT-A-CATH;  Surgeon: Emelia Loron, MD;  Location: [facility] OR;  Service: General;  Laterality: N/A;  Insertion of port-a-cath     Current Outpatient Prescriptions  Medication Sig Dispense Refill  . carbamazepine (TEGRETOL XR) 100 MG 12 hr tablet Take 100-200 mg by mouth 3 (three) times daily. Take 1 cap in the morning, 2 cap at lunch, and 2 cap in the evening      . diphenhydrAMINE (BENADRYL) 25 MG tablet Take 25 mg by mouth every 6 (six) hours as needed. For allergies.      . diphenoxylate-atropine (LOMOTIL) 2.5-0.025 MG per tablet Take 1 tablet by mouth 4 (four) times daily as needed for diarrhea or loose stools.  30 tablet  0  . DULoxetine (CYMBALTA) 60 MG capsule Take 60 mg by mouth every morning.      Marland Kitchen estradiol (VIVELLE-DOT) 0.075 MG/24HR Place 1 patch onto the skin 2 (two) times a week. On Sundays and Wednesdays.      Marland Kitchen ethosuximide (ZARONTIN) 250 MG capsule Take 250-500 mg by mouth 3 (three) times daily. Take 1 tablet every morning, take 2 tablets every afternoon, and take 2 tablets every night at bedtime.      Marland Kitchen ezetimibe (ZETIA) 10 MG tablet Take 10 mg by mouth at bedtime.      Marland Kitchen HYDROcodone-acetaminophen (NORCO) 10-325 MG per tablet Take 1 tablet by mouth every 6 (six) hours as needed for pain.  10 tablet  0  . levothyroxine (SYNTHROID, LEVOTHROID) 50 MCG tablet Take 50 mcg by mouth  daily.       Marland Kitchen lidocaine-prilocaine (EMLA) cream Apply topically as needed. Apply to [facility] site one hour prior to needle stick as needed to numb skin.  30 g  3  . nitroGLYCERIN (NITROSTAT) 0.4 MG SL tablet Place 0.4 mg under the tongue every 5 (five) minutes as needed. For chest pain.      Marland Kitchen ondansetron (ZOFRAN ODT) 8 MG disintegrating tablet Take 1 tablet (8 mg total) by mouth every 8 (eight) hours as needed for nausea.  20 tablet  0  . pantoprazole (PROTONIX) 40 MG tablet Take 1 tablet (40 mg total) by mouth every morning.  30 tablet  0  . prasugrel (EFFIENT) 10 MG TABS Take 10 mg by mouth every morning.      . Vitamin D, Ergocalciferol, (DRISDOL) 50000 UNITS CAPS Take 50,000 Units by mouth 2 (two) times a week. Pt takes twice weekly.  Monday and Wednesday       No current facility-administered medications for this visit.    ALLERGIES:  is allergic to aspirin; erythromycin; imdur; lisinopril; niacin and related; penicillins; statins; sulfa drugs cross reactors; and tetracyclines & related.  REVIEW OF SYSTEMS:  The rest of the 14-point review of system was negative.   Filed Vitals:   09/30/12 0848  BP: 131/85  Pulse: 80  Temp: 96.8 F (36 C)  Resp: 18   Wt Readings from Last 3 Encounters:  09/30/12 176 lb 4.8 oz (79.969 kg)  08/29/12 180 lb 9.6 oz (81.92 kg)  08/05/12 183 lb 4.8 oz (83.144 kg)   ECOG Performance status: 1  PHYSICAL EXAMINATION:   General:  well-nourished woman in no acute distress.  Eyes:  no scleral icterus.  ENT:  There were no oropharyngeal lesions.  Neck was without thyromegaly.  Lymphatics:  Negative cervical, supraclavicular or axillary adenopathy.  Respiratory: lungs were clear bilaterally without wheezing or crackles.  Cardiovascular:  Regular rate and rhythm, S1/S2, without murmur, rub or gallop.  There was no pedal edema.  GI:  abdomen was soft, flat, nontender, nondistended, without organomegaly.  Muscoloskeletal:  no spinal tenderness of palpation of  vertebral spine.  Skin exam was without echymosis, petichae. Neuro exam was nonfocal.  Patient was able to get on and off exam table without assistance.  Gait was normal.  Patient was alerted and oriented.  Attention was good.   Language was appropriate.  Mood was normal without depression.  Speech was not pressured.  Thought content was not tangential.     LABORATORY/RADIOLOGY DATA:  Lab Results  Component Value Date   WBC 5.6 09/30/2012   HGB 14.2 09/30/2012   HCT 41.4 09/30/2012   PLT 274 09/30/2012  GLUCOSE 112* 09/30/2012   CHOL 268* 08/17/2011   TRIG 107 08/17/2011   HDL 82 08/17/2011   LDLCALC 165* 08/17/2011   ALKPHOS 99 09/30/2012   ALT 11 09/30/2012   AST 12 09/30/2012   NA 137 09/30/2012   K 4.6 09/30/2012   CL 106 09/30/2012   CREATININE 0.8 09/30/2012   BUN 13.1 09/30/2012   CO2 22 09/30/2012   INR 1.03 08/17/2011   HGBA1C 5.7* 08/16/2011    ASSESSMENT AND PLAN:   1. Diagnosis: Stage II, colon cancer. -Treatment:  On 5FU adjuvant chemotherapy.  She is finished with 6 months of adjuvant chemo.  She has grade 2 fatigue.  I recommended restaging CT chest/abomen within 1 month to ensure no metastatic disease. I advised her to follow up with GI to see if she can have a surveillance colonoscopy by 12/2012.    2.   MGUS: SPEP and light chains are still stable with latest testing in 08/2012. Marland Kitchen There is no evidence of progression to active myeloma. Will continue observation.  In the future, if there is significant increase in M-spike; anemia, thrombocytopenia, then further work up may be considered.     3.  Follow up: in about 4 months.   The length of time of the face-to-face encounter was 15 minutes. More than 50% of time was spent counseling and coordination of care.

## 2012-10-22 ENCOUNTER — Telehealth: Payer: Self-pay | Admitting: Oncology

## 2012-10-23 ENCOUNTER — Encounter: Payer: Self-pay | Admitting: Oncology

## 2012-10-23 ENCOUNTER — Other Ambulatory Visit: Payer: Self-pay | Admitting: Oncology

## 2012-10-23 ENCOUNTER — Ambulatory Visit (HOSPITAL_COMMUNITY)
Admission: RE | Admit: 2012-10-23 | Discharge: 2012-10-23 | Disposition: A | Discharge status: Home / Self Care | Payer: BC Managed Care – PPO | Source: Ambulatory Visit | Attending: Oncology | Admitting: Oncology

## 2012-10-23 DIAGNOSIS — K769 Liver disease, unspecified: Secondary | ICD-10-CM | POA: Insufficient documentation

## 2012-10-23 DIAGNOSIS — R911 Solitary pulmonary nodule: Secondary | ICD-10-CM | POA: Insufficient documentation

## 2012-10-23 DIAGNOSIS — E041 Nontoxic single thyroid nodule: Secondary | ICD-10-CM

## 2012-10-23 DIAGNOSIS — C189 Malignant neoplasm of colon, unspecified: Secondary | ICD-10-CM | POA: Insufficient documentation

## 2012-10-23 DIAGNOSIS — M47817 Spondylosis without myelopathy or radiculopathy, lumbosacral region: Secondary | ICD-10-CM | POA: Insufficient documentation

## 2012-10-23 DIAGNOSIS — E278 Other specified disorders of adrenal gland: Secondary | ICD-10-CM | POA: Insufficient documentation

## 2012-10-23 MED ORDER — IOHEXOL 300 MG/ML  SOLN
100.0000 mL | Freq: Once | INTRAMUSCULAR | Status: AC | PRN
  Administered 2012-10-23: 100 mL via INTRAVENOUS

## 2012-10-24 ENCOUNTER — Other Ambulatory Visit: Payer: Self-pay | Admitting: *Deleted

## 2012-10-24 DIAGNOSIS — E041 Nontoxic single thyroid nodule: Secondary | ICD-10-CM

## 2012-10-25 ENCOUNTER — Other Ambulatory Visit: Payer: Self-pay | Admitting: Cardiology

## 2012-10-28 ENCOUNTER — Telehealth: Payer: Self-pay | Admitting: *Deleted

## 2012-10-28 NOTE — Telephone Encounter (Signed)
Pt called, states she received Dr. Lodema Pilot letter and asks what the next step is?  Informed pt that Dr. Gaylyn Rong has ordered US of thyroid,  Order has been sent and Radiology should be calling her soon to schedule the appt.  Asked her to call us back in a few days if she has not heard anything.  She verbalized understanding.

## 2012-10-29 ENCOUNTER — Telehealth: Payer: Self-pay | Admitting: *Deleted

## 2012-10-29 NOTE — Telephone Encounter (Signed)
VM from [facility] states they have not been able to contact pt for her [place] appt..  I returned call and left VM with pt's contact numbers and asked them to call us back if they are still unable to reach pt.Rachel Clark

## 2012-10-29 NOTE — Telephone Encounter (Signed)
Call from Henri at [facility] states she gets a busy signal every time she has tried to call pt on her home number.  I called pt on home number and pt answered.  I gave her the phone number for [facility] [phone] and asked her to call now to make appt.  She said she will do this.

## 2012-10-30 ENCOUNTER — Telehealth: Payer: Self-pay | Admitting: Cardiology

## 2012-10-30 NOTE — Telephone Encounter (Signed)
No lovenox bridging for effient, works like plavix per Weston Brass, PharmD, needs to route to Dr [place]

## 2012-10-30 NOTE — Telephone Encounter (Signed)
Will forward to Dr. Sw[place]iland and [facility].

## 2012-10-30 NOTE — Telephone Encounter (Signed)
New problem    Patient on effient need to be off 7 days please advise any Lovenox bridges.

## 2012-10-31 ENCOUNTER — Telehealth: Payer: Self-pay | Admitting: Cardiology

## 2012-10-31 NOTE — Telephone Encounter (Signed)
No need for bridging lovenox. Can stop Effient 7 days prior to intervention.  Abran Gavigan [place] MD, [facility]

## 2012-10-31 NOTE — Telephone Encounter (Signed)
Returned call to Henri,Dr.Jordan advised no need for bridging lovenox,can stop effient 7 days prior to intervention.

## 2012-10-31 NOTE — Telephone Encounter (Signed)
See previous 10/31/12 note.

## 2012-10-31 NOTE — Telephone Encounter (Signed)
New problem     Read previous message from 4/30 .  Can a copy to be sent to the office.

## 2012-11-11 ENCOUNTER — Ambulatory Visit (HOSPITAL_BASED_OUTPATIENT_CLINIC_OR_DEPARTMENT_OTHER): Payer: BC Managed Care – PPO

## 2012-11-11 DIAGNOSIS — Z452 Encounter for adjustment and management of vascular access device: Secondary | ICD-10-CM

## 2012-11-11 DIAGNOSIS — C189 Malignant neoplasm of colon, unspecified: Secondary | ICD-10-CM

## 2012-11-11 DIAGNOSIS — C184 Malignant neoplasm of transverse colon: Secondary | ICD-10-CM

## 2012-11-11 MED ORDER — HEPARIN SOD (PORK) LOCK FLUSH 100 UNIT/ML IV SOLN
500.0000 [IU] | Freq: Once | INTRAVENOUS | Status: AC
  Administered 2012-11-11: 500 [IU] via INTRAVENOUS
  Filled 2012-11-11: qty 5

## 2012-11-11 MED ORDER — SODIUM CHLORIDE 0.9 % IJ SOLN
10.0000 mL | INTRAMUSCULAR | Status: DC | PRN
  Administered 2012-11-11: 10 mL via INTRAVENOUS
  Filled 2012-11-11: qty 10

## 2012-11-11 NOTE — Patient Instructions (Signed)
You had your port flushed today, continue to have flushed every 6-8 weeks 

## 2012-11-12 ENCOUNTER — Ambulatory Visit
Admission: RE | Admit: 2012-11-12 | Discharge: 2012-11-12 | Disposition: A | Discharge status: Home / Self Care | Payer: BC Managed Care – PPO | Source: Ambulatory Visit | Attending: Oncology | Admitting: Oncology

## 2012-11-12 ENCOUNTER — Other Ambulatory Visit: Payer: BC Managed Care – PPO

## 2012-11-12 ENCOUNTER — Other Ambulatory Visit (HOSPITAL_COMMUNITY)
Admission: RE | Admit: 2012-11-12 | Discharge: 2012-11-12 | Disposition: A | Discharge status: Home / Self Care | Payer: BC Managed Care – PPO | Source: Ambulatory Visit | Attending: Interventional Radiology | Admitting: Interventional Radiology

## 2012-11-12 DIAGNOSIS — E041 Nontoxic single thyroid nodule: Secondary | ICD-10-CM

## 2012-11-12 DIAGNOSIS — E049 Nontoxic goiter, unspecified: Secondary | ICD-10-CM | POA: Insufficient documentation

## 2012-11-13 ENCOUNTER — Encounter: Payer: Self-pay | Admitting: Oncology

## 2012-12-12 ENCOUNTER — Telehealth: Payer: Self-pay

## 2012-12-12 NOTE — Telephone Encounter (Signed)
Received request from [facility] for patient to hold effient 5 to 7 days prior to colonoscopy.Dr.Jordan advised ok.Form faxed back to fax # [phone].

## 2012-12-23 ENCOUNTER — Ambulatory Visit: Payer: BC Managed Care – PPO

## 2012-12-23 ENCOUNTER — Telehealth: Payer: Self-pay | Admitting: Oncology

## 2012-12-25 ENCOUNTER — Other Ambulatory Visit (HOSPITAL_BASED_OUTPATIENT_CLINIC_OR_DEPARTMENT_OTHER): Payer: BC Managed Care – PPO | Admitting: Lab

## 2012-12-25 ENCOUNTER — Other Ambulatory Visit: Payer: Self-pay | Admitting: Oncology

## 2012-12-25 ENCOUNTER — Ambulatory Visit (HOSPITAL_BASED_OUTPATIENT_CLINIC_OR_DEPARTMENT_OTHER): Payer: BC Managed Care – PPO

## 2012-12-25 VITALS — BP 121/79 | HR 72 | Temp 97.5°F | Resp 18

## 2012-12-25 DIAGNOSIS — Z452 Encounter for adjustment and management of vascular access device: Secondary | ICD-10-CM

## 2012-12-25 DIAGNOSIS — C189 Malignant neoplasm of colon, unspecified: Secondary | ICD-10-CM

## 2012-12-25 LAB — CBC WITH DIFFERENTIAL/PLATELET
BASO%: 0.2 % (ref 0.0–2.0)
Eosinophils Absolute: 0.3 10*3/uL (ref 0.0–0.5)
LYMPH%: 35.9 % (ref 14.0–49.7)
MCHC: 33.7 g/dL (ref 31.5–36.0)
MONO#: 0.4 10*3/uL (ref 0.1–0.9)
NEUT#: 3 10*3/uL (ref 1.5–6.5)
Platelets: 283 10*3/uL (ref 145–400)
RBC: 4.21 10*6/uL (ref 3.70–5.45)
RDW: 12.7 % (ref 11.2–14.5)
WBC: 5.8 10*3/uL (ref 3.9–10.3)
lymph#: 2.1 10*3/uL (ref 0.9–3.3)
nRBC: 0 % (ref 0–0)

## 2012-12-25 LAB — COMPREHENSIVE METABOLIC PANEL (CC13)
ALT: 7 U/L (ref 0–55)
AST: 9 U/L (ref 5–34)
Albumin: 3.3 g/dL — ABNORMAL LOW (ref 3.5–5.0)
CO2: 24 mEq/L (ref 22–29)
Calcium: 8.5 mg/dL (ref 8.4–10.4)
Chloride: 106 mEq/L (ref 98–107)
Potassium: 4.2 mEq/L (ref 3.5–5.1)
Sodium: 137 mEq/L (ref 136–145)
Total Protein: 7 g/dL (ref 6.4–8.3)

## 2012-12-25 MED ORDER — SODIUM CHLORIDE 0.9 % IJ SOLN
10.0000 mL | INTRAMUSCULAR | Status: DC | PRN
  Administered 2012-12-25: 10 mL via INTRAVENOUS
  Filled 2012-12-25: qty 10

## 2012-12-25 MED ORDER — HEPARIN SOD (PORK) LOCK FLUSH 100 UNIT/ML IV SOLN
500.0000 [IU] | Freq: Once | INTRAVENOUS | Status: AC
  Administered 2012-12-25: 500 [IU] via INTRAVENOUS
  Filled 2012-12-25: qty 5

## 2012-12-25 NOTE — Patient Instructions (Signed)
Call MD for problems or concerns 

## 2013-01-30 ENCOUNTER — Other Ambulatory Visit (HOSPITAL_BASED_OUTPATIENT_CLINIC_OR_DEPARTMENT_OTHER): Payer: BC Managed Care – PPO | Admitting: Lab

## 2013-01-30 DIAGNOSIS — C189 Malignant neoplasm of colon, unspecified: Secondary | ICD-10-CM

## 2013-01-30 LAB — CBC WITH DIFFERENTIAL/PLATELET
BASO%: 0.5 % (ref 0.0–2.0)
EOS%: 5.7 % (ref 0.0–7.0)
HCT: 39.3 % (ref 34.8–46.6)
MCH: 30.5 pg (ref 25.1–34.0)
MCHC: 33.7 g/dL (ref 31.5–36.0)
MONO#: 0.5 10*3/uL (ref 0.1–0.9)
NEUT%: 50.3 % (ref 38.4–76.8)
RBC: 4.35 10*6/uL (ref 3.70–5.45)
WBC: 6.6 10*3/uL (ref 3.9–10.3)
lymph#: 2.4 10*3/uL (ref 0.9–3.3)

## 2013-01-30 LAB — COMPREHENSIVE METABOLIC PANEL (CC13)
AST: 14 U/L (ref 5–34)
Alkaline Phosphatase: 107 U/L (ref 40–150)
BUN: 13.3 mg/dL (ref 7.0–26.0)
Glucose: 102 mg/dl (ref 70–140)
Total Bilirubin: 0.25 mg/dL (ref 0.20–1.20)

## 2013-02-03 LAB — KAPPA/LAMBDA LIGHT CHAINS
Kappa free light chain: 2.13 mg/dL — ABNORMAL HIGH (ref 0.33–1.94)
Lambda Free Lght Chn: 1.51 mg/dL (ref 0.57–2.63)

## 2013-02-03 LAB — PROTEIN ELECTROPHORESIS, SERUM
Alpha-1-Globulin: 4.5 % (ref 2.9–4.9)
Alpha-2-Globulin: 11.1 % (ref 7.1–11.8)
Gamma Globulin: 17.5 % (ref 11.1–18.8)
M-Spike, %: 0.5 g/dL
Total Protein, Serum Electrophoresis: 6.9 g/dL (ref 6.0–8.3)

## 2013-02-06 ENCOUNTER — Encounter: Payer: Self-pay | Admitting: Oncology

## 2013-02-06 ENCOUNTER — Telehealth: Payer: Self-pay | Admitting: Hematology and Oncology

## 2013-02-06 ENCOUNTER — Ambulatory Visit (HOSPITAL_BASED_OUTPATIENT_CLINIC_OR_DEPARTMENT_OTHER): Payer: BC Managed Care – PPO | Admitting: Oncology

## 2013-02-06 ENCOUNTER — Ambulatory Visit (HOSPITAL_BASED_OUTPATIENT_CLINIC_OR_DEPARTMENT_OTHER): Payer: BC Managed Care – PPO

## 2013-02-06 ENCOUNTER — Ambulatory Visit: Payer: BC Managed Care – PPO | Admitting: Lab

## 2013-02-06 VITALS — Temp 96.7°F

## 2013-02-06 VITALS — BP 153/89 | HR 89 | Temp 97.7°F | Resp 20 | Ht 69.0 in | Wt 187.7 lb

## 2013-02-06 DIAGNOSIS — D472 Monoclonal gammopathy: Secondary | ICD-10-CM

## 2013-02-06 DIAGNOSIS — R911 Solitary pulmonary nodule: Secondary | ICD-10-CM

## 2013-02-06 DIAGNOSIS — C189 Malignant neoplasm of colon, unspecified: Secondary | ICD-10-CM

## 2013-02-06 DIAGNOSIS — E041 Nontoxic single thyroid nodule: Secondary | ICD-10-CM | POA: Insufficient documentation

## 2013-02-06 MED ORDER — SODIUM CHLORIDE 0.9 % IJ SOLN
10.0000 mL | INTRAMUSCULAR | Status: DC | PRN
  Administered 2013-02-06: 10 mL via INTRAVENOUS
  Filled 2013-02-06: qty 10

## 2013-02-06 MED ORDER — HEPARIN SOD (PORK) LOCK FLUSH 100 UNIT/ML IV SOLN
500.0000 [IU] | Freq: Once | INTRAVENOUS | Status: AC
  Administered 2013-02-06: 500 [IU] via INTRAVENOUS
  Filled 2013-02-06: qty 5

## 2013-02-06 NOTE — Progress Notes (Signed)
[facility]  Telephone:([phone]) [phone] Fax:(336) [phone]   OFFICE PROGRESS NOTE   Cc:  PICKETT,KATHERINE, PA-C  DIAGNOSIS AND PAST THERAPY:  1. MGUS: Watchful observation.  2. Newly diagnosed pT3 N0 M0 transverse colon adenocarcinoma. S/p laparoscopic-assisted transverse colectomy by Dr. Dwain Sarna on 12/19/2011.   PRIOR THERAPY: Received adjuvant Xeloda 2,000mg  PO BID in 02/2012.  She switched to IV 5FU in late 06/2012 due to poor toleration of Xeloda. Therapy was completed on 09/16/12.  CURRENT THERAPY: Watchful observation.  INTERVAL HISTORY: Rachel Clark 62 y.o. female returns for regular follow up with her husband.  Reports that she is doing well. She is going to [place] in September 2014. 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  She denied fever, mucositis, nausea/vomiting, SOB, chest pain, diarrhea, skin rash, abdominal pain, bowel/bladder incontinence, lower back pain, paresthesia. She had a recent colonoscopy in July 2014 which was normal. Next colonoscopy is due July 2017.   Past Medical History  Diagnosis Date  . Coronary artery disease     Has had remote NSTEMI with PCI to the RCA; S/P CABG in 2007; LAST CATH WITH MODERATE DISEASE IN A VERY SMALL DIAGONAL BRANCH; SHE IS MANAGED MEDICALLY  . Dyslipidemia   . Seizure disorder   . History of fibromyalgia   . Chest pain   . Monoclonal gammopathy of unknown significance   . Colon cancer 12/04/2011    s/p Laparoscopic-assisted transverse colectomy on 12/19/2011 by Dr. Dwain Sarna.  pT3 N0 M0.   Marland Kitchen Hyperlipidemia   . Myocardial infarction 2007  . PONV (postoperative nausea and vomiting)   . Bronchitis     hx of;>72yrs  . Headache(784.0)   . Seizures 12/06/11    last seizure over 40 yrs ago;takes Tegretol   . Fibromyalgia   . Arthritis     hands   . GERD (gastroesophageal reflux disease)     takes Pantoprazole daily  . Gastric ulcer   . Iron deficiency anemia 11/17/2011  . Hypothyroid     takes  Levothyroxine daily  . Depression     takes Cymbalta daily  . Diarrhea 05/10/2012  . Diarrhea 05/10/2012    Past Surgical History  Procedure Laterality Date  . Partial hysterectomy  20+yrs ago  . Posterior laminectomy / decompression cervical spine      20+yrs ago  . Cardiac catheterization  05/20/2009    obstructive native vessel disease in LAD, RCA, and first diagonal, patent vein graft to distal RCA and LIMA to LAD,normal. ef 60%  . Colonoscopy    . Coronary artery bypass graft  2007    x 2  . Coronary angioplasty      1 stent  . Colon resection  12/19/2011    Procedure: COLON RESECTION LAPAROSCOPIC;  Surgeon: Emelia Loron, MD;  Location: [facility] OR;  Service: General;  Laterality: N/A;  laparoscopic hand assisted partial colon resection  . Portacath placement  06/04/2012    Procedure: INSERTION PORT-A-CATH;  Surgeon: Emelia Loron, MD;  Location: [facility] OR;  Service: General;  Laterality: N/A;  Insertion of port-a-cath     Current Outpatient Prescriptions  Medication Sig Dispense Refill  . carbamazepine (TEGRETOL XR) 100 MG 12 hr tablet Take 100-200 mg by mouth 3 (three) times daily. Take 1 cap in the morning, 2 cap at lunch, and 2 cap in the evening      . diphenhydrAMINE (BENADRYL) 25 MG tablet Take 25 mg by mouth every 6 (six) hours as needed. For  allergies.      . DULoxetine (CYMBALTA) 60 MG capsule Take 60 mg by mouth every morning.      Marland Kitchen EFFIENT 10 MG TABS TAKE 1 TABLET (10 MG TOTAL) BY MOUTH DAILY.  30 tablet  1  . estradiol (VIVELLE-DOT) 0.075 MG/24HR Place 1 patch onto the skin 2 (two) times a week. On Sundays and Wednesdays.      Marland Kitchen ethosuximide (ZARONTIN) 250 MG capsule Take 250-500 mg by mouth 3 (three) times daily. Take 1 tablet every morning, take 2 tablets every afternoon, and take 2 tablets every night at bedtime.      Marland Kitchen ezetimibe (ZETIA) 10 MG tablet Take 10 mg by mouth at bedtime.      Marland Kitchen levothyroxine (SYNTHROID, LEVOTHROID) 50 MCG tablet Take 50 mcg by mouth  daily.       . nitroGLYCERIN (NITROSTAT) 0.4 MG SL tablet Place 0.4 mg under the tongue every 5 (five) minutes as needed. For chest pain.      . pantoprazole (PROTONIX) 40 MG tablet Take 1 tablet (40 mg total) by mouth every morning.  30 tablet  0   No current facility-administered medications for this visit.    ALLERGIES:  is allergic to aspirin; erythromycin; imdur; lisinopril; niacin and related; penicillins; statins; sulfa drugs cross reactors; and tetracyclines & related.  REVIEW OF SYSTEMS:  The rest of the 14-point review of system was negative.   Filed Vitals:   02/06/13 0918  BP: 153/89  Pulse: 89  Temp: 97.7 F (36.5 C)  Resp: 20   Wt Readings from Last 3 Encounters:  02/06/13 187 lb 11.2 oz (85.14 kg)  09/30/12 176 lb 4.8 oz (79.969 kg)  08/29/12 180 lb 9.6 oz (81.92 kg)   ECOG Performance status: 1  PHYSICAL EXAMINATION:   General:  well-nourished woman in no acute distress.  Eyes:  no scleral icterus.  ENT:  There were no oropharyngeal lesions.  Neck was without thyromegaly.  Lymphatics:  Negative cervical, supraclavicular or axillary adenopathy.  Respiratory: lungs were clear bilaterally without wheezing or crackles.  Cardiovascular:  Regular rate and rhythm, S1/S2, without murmur, rub or gallop.  There was no pedal edema.  GI:  abdomen was soft, flat, nontender, nondistended, without organomegaly.  Muscoloskeletal:  no spinal tenderness of palpation of vertebral spine.  Skin exam was without echymosis, petichae. Neuro exam was nonfocal.  Patient was able to get on and off exam table without assistance.  Gait was normal.  Patient was alerted and oriented.  Attention was good.   Language was appropriate.  Mood was normal without depression.  Speech was not pressured.  Thought content was not tangential.     LABORATORY/RADIOLOGY DATA:  Lab Results  Component Value Date   WBC 6.6 01/30/2013   HGB 13.3 01/30/2013   HCT 39.3 01/30/2013   PLT 281 01/30/2013   GLUCOSE 102  01/30/2013   CHOL 268* 08/17/2011   TRIG 107 08/17/2011   HDL 82 08/17/2011   LDLCALC 165* 08/17/2011   ALKPHOS 107 01/30/2013   ALT 12 01/30/2013   AST 14 01/30/2013   NA 138 01/30/2013   K 4.5 01/30/2013   CL 106 12/25/2012   CREATININE 0.7 01/30/2013   BUN 13.3 01/30/2013   CO2 24 01/30/2013   INR 1.03 08/17/2011   HGBA1C 5.7* 08/16/2011   IMAGING:  *RADIOLOGY REPORT*  Clinical Data: History of colon cancer  CT CHEST AND ABDOMEN WITH CONTRAST  Technique: Multidetector CT imaging of the chest, and abdomen was  performed following the standard protocol during bolus  administration of intravenous contrast.  Contrast: OMNIPAQUE IOHEXOL 300 MG/ML SOLN  Comparison: 12/28/2011  CT CHEST  Findings: Lungs/pleura: There is no pleural effusion. Left apical  sub solid nodule is unchanged measuring 1.2 cm, image 12/series 5.  Granuloma identified within the right hepatic lobe, image 38/series  5.  Heart/Mediastinum: Normal heart size. No pericardial effusion.  Previous median sternotomy and CABG procedure. Calcified low right  paratracheal and subcarinal lymph nodes identified.  Bones/Musculoskeletal: No enlarged axillary or supraclavicular  adenopathy. Nodule in the right lobe of thyroid gland measures 2  cm, image 7/series 2.  IMPRESSION:  1. Stable CT of the chest. No specific features to suggest  metastatic disease.  2. Similar appearance of the nodule and right lobe of thyroid  gland. Consider further evaluation with thyroid ultrasound. If  patient is clinically hyperthyroid, consider nuclear medicine  thyroid uptake and scan.  3. No significant change of sub solid nodule in the left lung  apex. Although this is been stable for 3 years since prior exam,  adenocarcinoma in situ cannot be excluded. Consultation with the  [facility] [phone]) should  be considered.  CT ABDOMEN  Findings: Low attenuation structure along the dome of the right    hepatic lobe is unchanged measure the 5 mm, image 49/series 2. The  gallbladder appears normal. No biliary dilatation. Normal  appearance of the pancreas. The splenic granuloma identified.  The right adrenal gland is normal. Stable slight asymmetric  enlargement of the left adrenal gland. Normal appearance of the  right kidney. Parapelvic cysts identified within the inferior pole  of the left kidney.  Normal caliber of the abdominal aorta. There is no upper abdominal  adenopathy. No free fluid or fluid collections identified within  the upper abdomen.  Normal appearance of the visualized bowel loops.  The mild spondylosis identified within the lumbar spine.  IMPRESSION:  1. Stable CT of the upper abdomen. No evidence of metastatic  disease.  Original Report Authenticated By: Signa Kell, M.D.   *RADIOLOGY REPORT*  Clinical Data: Thyroid nodule noted on CT  THYROID ULTRASOUND  Technique: Ultrasound examination of the thyroid gland and adjacent  soft tissues was performed.  Comparison: CT chest 10/23/2012  Findings:  Right thyroid lobe: 5.1 x 2.2 x 2.4 cm.  Left thyroid lobe: 5.1 x 1.3 x 1.5 cm.  Isthmus: There is a dominant bilobed vascular nodule in the  interpolar region of the right lobe measuring 3.2 x 1.0 x 2.3 cm.  Other sub centimeter hypoechoic nodules are noted.  Focal nodules: None  Lymphadenopathy: None visualized.  IMPRESSION:  Dominant and vascular right thyroid nodule meets criteria for  biopsy. Fine needle aspiration biopsy is to follow.  Original Report Authenticated By: Jolaine Click, M.D.  ASSESSMENT AND PLAN:   1. Diagnosis: Stage II, colon cancer. -Treatment:  On 5FU adjuvant chemotherapy.  She completed 6 months of adjuvant chemo. Side effects have resolved. Restaging CT chest/abomen did not show evidence of metastatic disease. Colonoscopy was normal in July 2014. Next colonoscopy due in July 2017. I have requested a CEA today.  2.   MGUS: SPEP and light  chains are still stable. There is no evidence of progression to active myeloma. Will continue observation.  In the future, if there is significant increase in M-spike; anemia, thrombocytopenia, then further work up may be considered.     3. Lung nodule: 1.2 cm nodule noted on CT scan  from April 2014. This has been stable for ~ 3 years, but the recommendation was made to review in [facility]. Referral given to [facility]igator for review. I have tentatively set her up for a f/u CT of the chest in ~Oct/Nov of this year, but informed the patient that we may need to adjust this based on [facility] recommendation.  4. Thyroid nodule. This have been stable. Biopsy did not yield enough material for diagnosis. Per plan outlined by Dr Gaylyn Rong, I have set patient up for a repeat U/S of the thyroid in Oct/Nov. May need to consider biopsy if the nodule has enlarged.    5.  Follow up: in about 3-4 months.   The length of time of the face-to-face encounter was 50 minutes. More than 50% of time was spent counseling and coordination of care.

## 2013-02-06 NOTE — Telephone Encounter (Signed)
gv pt appt schedule for September thru December. [facility] will contact pt re scans - pt aware.

## 2013-02-21 ENCOUNTER — Telehealth: Payer: Self-pay | Admitting: Oncology

## 2013-02-21 NOTE — Telephone Encounter (Signed)
Call placed to pt. Notified her that [facility] recommended repeat CT scan to follow lung nodule as we already have scheduled later this year. She agrees with plan.

## 2013-02-26 ENCOUNTER — Other Ambulatory Visit: Payer: Self-pay | Admitting: Oncology

## 2013-03-11 ENCOUNTER — Ambulatory Visit (HOSPITAL_BASED_OUTPATIENT_CLINIC_OR_DEPARTMENT_OTHER): Payer: BC Managed Care – PPO

## 2013-03-11 VITALS — BP 140/92 | HR 79 | Temp 97.9°F | Resp 20

## 2013-03-11 DIAGNOSIS — C182 Malignant neoplasm of ascending colon: Secondary | ICD-10-CM

## 2013-03-11 DIAGNOSIS — C189 Malignant neoplasm of colon, unspecified: Secondary | ICD-10-CM

## 2013-03-11 DIAGNOSIS — Z452 Encounter for adjustment and management of vascular access device: Secondary | ICD-10-CM

## 2013-03-11 MED ORDER — SODIUM CHLORIDE 0.9 % IJ SOLN
10.0000 mL | INTRAMUSCULAR | Status: DC | PRN
  Administered 2013-03-11: 10 mL via INTRAVENOUS
  Filled 2013-03-11: qty 10

## 2013-03-11 MED ORDER — HEPARIN SOD (PORK) LOCK FLUSH 100 UNIT/ML IV SOLN
500.0000 [IU] | Freq: Once | INTRAVENOUS | Status: AC
  Administered 2013-03-11: 500 [IU] via INTRAVENOUS
  Filled 2013-03-11: qty 5

## 2013-03-11 NOTE — Patient Instructions (Addendum)

## 2013-04-01 ENCOUNTER — Encounter (INDEPENDENT_AMBULATORY_CARE_PROVIDER_SITE_OTHER): Payer: Self-pay

## 2013-04-30 ENCOUNTER — Telehealth: Payer: Self-pay | Admitting: Hematology and Oncology

## 2013-04-30 NOTE — Telephone Encounter (Signed)
sw pt made aware of Dr Bertis Ruddy OV for 11/6 shh

## 2013-05-05 ENCOUNTER — Other Ambulatory Visit (HOSPITAL_BASED_OUTPATIENT_CLINIC_OR_DEPARTMENT_OTHER): Payer: BC Managed Care – PPO | Admitting: Lab

## 2013-05-05 ENCOUNTER — Encounter (INDEPENDENT_AMBULATORY_CARE_PROVIDER_SITE_OTHER): Payer: Self-pay

## 2013-05-05 ENCOUNTER — Ambulatory Visit (HOSPITAL_COMMUNITY)
Admission: RE | Admit: 2013-05-05 | Discharge: 2013-05-05 | Disposition: A | Discharge status: Home / Self Care | Payer: BC Managed Care – PPO | Source: Ambulatory Visit | Attending: Oncology | Admitting: Oncology

## 2013-05-05 ENCOUNTER — Encounter (HOSPITAL_COMMUNITY): Payer: Self-pay

## 2013-05-05 ENCOUNTER — Ambulatory Visit (HOSPITAL_BASED_OUTPATIENT_CLINIC_OR_DEPARTMENT_OTHER): Payer: BC Managed Care – PPO

## 2013-05-05 VITALS — BP 152/74 | HR 73 | Temp 97.2°F | Resp 18

## 2013-05-05 DIAGNOSIS — Z452 Encounter for adjustment and management of vascular access device: Secondary | ICD-10-CM

## 2013-05-05 DIAGNOSIS — Z951 Presence of aortocoronary bypass graft: Secondary | ICD-10-CM | POA: Insufficient documentation

## 2013-05-05 DIAGNOSIS — E042 Nontoxic multinodular goiter: Secondary | ICD-10-CM | POA: Insufficient documentation

## 2013-05-05 DIAGNOSIS — C189 Malignant neoplasm of colon, unspecified: Secondary | ICD-10-CM

## 2013-05-05 DIAGNOSIS — Z95828 Presence of other vascular implants and grafts: Secondary | ICD-10-CM

## 2013-05-05 DIAGNOSIS — Z85038 Personal history of other malignant neoplasm of large intestine: Secondary | ICD-10-CM | POA: Insufficient documentation

## 2013-05-05 DIAGNOSIS — E041 Nontoxic single thyroid nodule: Secondary | ICD-10-CM

## 2013-05-05 DIAGNOSIS — R911 Solitary pulmonary nodule: Secondary | ICD-10-CM

## 2013-05-05 LAB — COMPREHENSIVE METABOLIC PANEL (CC13)
Anion Gap: 9 mEq/L (ref 3–11)
BUN: 12.7 mg/dL (ref 7.0–26.0)
CO2: 22 mEq/L (ref 22–29)
Creatinine: 0.7 mg/dL (ref 0.6–1.1)
Glucose: 99 mg/dl (ref 70–140)
Sodium: 137 mEq/L (ref 136–145)
Total Bilirubin: 0.28 mg/dL (ref 0.20–1.20)
Total Protein: 7.4 g/dL (ref 6.4–8.3)

## 2013-05-05 LAB — CBC WITH DIFFERENTIAL/PLATELET
Basophils Absolute: 0 10*3/uL (ref 0.0–0.1)
Eosinophils Absolute: 0.5 10*3/uL (ref 0.0–0.5)
HCT: 40.8 % (ref 34.8–46.6)
HGB: 13.5 g/dL (ref 11.6–15.9)
LYMPH%: 31.7 % (ref 14.0–49.7)
MCV: 90 fL (ref 79.5–101.0)
MONO#: 0.5 10*3/uL (ref 0.1–0.9)
NEUT#: 3.7 10*3/uL (ref 1.5–6.5)
NEUT%: 52.9 % (ref 38.4–76.8)
Platelets: 286 10*3/uL (ref 145–400)
WBC: 6.9 10*3/uL (ref 3.9–10.3)

## 2013-05-05 MED ORDER — IOHEXOL 300 MG/ML  SOLN
100.0000 mL | Freq: Once | INTRAMUSCULAR | Status: AC | PRN
  Administered 2013-05-05: 100 mL via INTRAVENOUS

## 2013-05-05 MED ORDER — SODIUM CHLORIDE 0.9 % IJ SOLN
10.0000 mL | INTRAMUSCULAR | Status: DC | PRN
  Administered 2013-05-05: 10 mL via INTRAVENOUS
  Filled 2013-05-05: qty 10

## 2013-05-05 MED ORDER — HEPARIN SOD (PORK) LOCK FLUSH 100 UNIT/ML IV SOLN
500.0000 [IU] | Freq: Once | INTRAVENOUS | Status: AC
  Administered 2013-05-05: 500 [IU] via INTRAVENOUS
  Filled 2013-05-05: qty 5

## 2013-05-08 ENCOUNTER — Encounter: Payer: Self-pay | Admitting: Hematology and Oncology

## 2013-05-08 ENCOUNTER — Ambulatory Visit (HOSPITAL_BASED_OUTPATIENT_CLINIC_OR_DEPARTMENT_OTHER): Payer: BC Managed Care – PPO | Admitting: Hematology and Oncology

## 2013-05-08 ENCOUNTER — Telehealth: Payer: Self-pay | Admitting: Hematology and Oncology

## 2013-05-08 ENCOUNTER — Encounter (INDEPENDENT_AMBULATORY_CARE_PROVIDER_SITE_OTHER): Payer: Self-pay

## 2013-05-08 VITALS — BP 143/80 | HR 67 | Temp 97.0°F | Resp 18 | Ht 69.0 in | Wt 189.6 lb

## 2013-05-08 DIAGNOSIS — R911 Solitary pulmonary nodule: Secondary | ICD-10-CM

## 2013-05-08 DIAGNOSIS — K7689 Other specified diseases of liver: Secondary | ICD-10-CM

## 2013-05-08 DIAGNOSIS — D472 Monoclonal gammopathy: Secondary | ICD-10-CM

## 2013-05-08 DIAGNOSIS — C189 Malignant neoplasm of colon, unspecified: Secondary | ICD-10-CM

## 2013-05-08 DIAGNOSIS — IMO0001 Reserved for inherently not codable concepts without codable children: Secondary | ICD-10-CM

## 2013-05-08 DIAGNOSIS — E041 Nontoxic single thyroid nodule: Secondary | ICD-10-CM

## 2013-05-08 DIAGNOSIS — C184 Malignant neoplasm of transverse colon: Secondary | ICD-10-CM

## 2013-05-08 NOTE — Progress Notes (Signed)
[facility] OFFICE PROGRESS NOTE  Patient Care Team: Ricci Barker, PA-C as PCP - General Obie Dredge, MD as Consulting Physician (Internal Medicine) Shirley Friar, MD (Gastroenterology) Emelia Loron, MD (General Surgery) Exie Parody, MD (Hematology and Oncology) Exie Parody, MD (Hematology and Oncology)  DIAGNOSIS: monoclonal gammopathy of unknown significance and stage II colon cancer status post resection  SUMMARY OF ONCOLOGIC HISTORY: #1 MGUS This was discovered more than 3 years ago. The patient have very minimal M spike and asymptomatic. #2 T3, N0, M0 colon cancer This was discovered when the patient presented with severe anemia. Colonoscopy revealed abnormal lesion and in 12/19/2011 Dr. Dwain Sarna perform laparoscopic-assisted transverse colectomy. She received adjuvant Xeloda in August 2013 and switched to IV 5-FU in December 2013 due to poor tolerance to Xeloda. Her last chemotherapy is completed by March 2014 INTERVAL HISTORY: Rachel Clark 62 y.o. female returns for further followup. She denies any abdomen no pain. She denies any recent fever, chills, night sweats or abnormal weight loss Denies any bone pain. The patient have chronic musculoskeletal pain due to fibromyalgia.  I have reviewed the past medical history, past surgical history, social history and family history with the patient and they are unchanged from previous note.  ALLERGIES:  is allergic to aspirin; erythromycin; imdur; lisinopril; niacin and related; penicillins; statins; sulfa drugs cross reactors; and tetracyclines & related.  MEDICATIONS:  Current Outpatient Prescriptions  Medication Sig Dispense Refill  . carbamazepine (TEGRETOL XR) 100 MG 12 hr tablet Take 100-200 mg by mouth 3 (three) times daily. Take 1 cap in the morning, 2 cap at lunch, and 2 cap in the evening      . diphenhydrAMINE (BENADRYL) 25 MG tablet Take 25 mg by mouth every 6 (six) hours as needed. For  allergies.      . DULoxetine (CYMBALTA) 60 MG capsule Take 60 mg by mouth every morning.      Marland Kitchen EFFIENT 10 MG TABS TAKE 1 TABLET (10 MG TOTAL) BY MOUTH DAILY.  30 tablet  1  . estradiol (VIVELLE-DOT) 0.075 MG/24HR Place 1 patch onto the skin 2 (two) times a week. On Sundays and Wednesdays.      Marland Kitchen ethosuximide (ZARONTIN) 250 MG capsule Take 250-500 mg by mouth 3 (three) times daily. Take 1 tablet every morning, take 2 tablets every afternoon, and take 2 tablets every night at bedtime.      Marland Kitchen ezetimibe (ZETIA) 10 MG tablet Take 10 mg by mouth at bedtime.      Marland Kitchen levothyroxine (SYNTHROID, LEVOTHROID) 50 MCG tablet Take 50 mcg by mouth daily.       . nitroGLYCERIN (NITROSTAT) 0.4 MG SL tablet Place 0.4 mg under the tongue every 5 (five) minutes as needed. For chest pain.      . pantoprazole (PROTONIX) 40 MG tablet Take 1 tablet (40 mg total) by mouth every morning.  30 tablet  0   No current facility-administered medications for this visit.    REVIEW OF SYSTEMS:   Constitutional: Denies fevers, chills or abnormal weight loss Eyes: Denies blurriness of vision Ears, nose, mouth, throat, and face: Denies mucositis or sore throat Respiratory: Denies cough, dyspnea or wheezes Cardiovascular: Denies palpitation, chest discomfort or lower extremity swelling Gastrointestinal:  Denies nausea, heartburn or change in bowel habits Skin: Denies abnormal skin rashes Lymphatics: Denies new lymphadenopathy or easy bruising Neurological:Denies numbness, tingling or new weaknesses Behavioral/Psych: Mood is stable, no new changes  All other systems were reviewed with  the patient and are negative.  PHYSICAL EXAMINATION: ECOG PERFORMANCE STATUS: 1 - Symptomatic but completely ambulatory  Filed Vitals:   05/08/13 0836  BP: 143/80  Pulse: 67  Temp: 97 F (36.1 C)  Resp: 18   Filed Weights   05/08/13 0836  Weight: 189 lb 9.6 oz (86.002 kg)    GENERAL:alert, no distress and comfortable SKIN: skin color,  texture, turgor are normal, no rashes or significant lesions EYES: normal, Conjunctiva are pink and non-injected, sclera clear OROPHARYNX:no exudate, no erythema and lips, buccal mucosa, and tongue normal  NECK: supple, thyroid normal size, non-tender, without nodularity LYMPH:  no palpable lymphadenopathy in the cervical, axillary or inguinal LUNGS: clear to auscultation and percussion with normal breathing effort HEART: regular rate & rhythm and no murmurs and no lower extremity edema ABDOMEN:abdomen soft, non-tender and normal bowel sounds. well-healed surgical scar no palpable abnormalities Musculoskeletal:no cyanosis of digits and no clubbing  NEURO: alert & oriented x 3 with fluent speech, no focal motor/sensory deficits  LABORATORY DATA:  I have reviewed the data as listed    Component Value Date/Time   NA 137 05/05/2013 0821   NA 132* 05/07/2012 1315   K 4.5 05/05/2013 0821   K 3.8 05/07/2012 1315   CL 106 12/25/2012 0924   CL 100 05/07/2012 1315   CO2 22 05/05/2013 0821   CO2 19 05/07/2012 1315   GLUCOSE 99 05/05/2013 0821   GLUCOSE 105* 12/25/2012 0924   GLUCOSE 115* 05/07/2012 1315   BUN 12.7 05/05/2013 0821   BUN 15 05/07/2012 1315   CREATININE 0.7 05/05/2013 0821   CREATININE 0.61 05/07/2012 1315   CALCIUM 9.3 05/05/2013 0821   CALCIUM 8.4 05/07/2012 1315   PROT 7.4 05/05/2013 0821   PROT 6.5 05/07/2012 1315   ALBUMIN 3.6 05/05/2013 0821   ALBUMIN 3.4* 05/07/2012 1315   AST 13 05/05/2013 0821   AST 41* 05/07/2012 1315   ALT 12 05/05/2013 0821   ALT 18 05/07/2012 1315   ALKPHOS 98 05/05/2013 0821   ALKPHOS 97 05/07/2012 1315   BILITOT 0.28 05/05/2013 0821   BILITOT 0.4 05/07/2012 1315   GFRNONAA >90 05/07/2012 1315   GFRAA >90 05/07/2012 1315    No results found for this basename: SPEP, UPEP,  kappa and lambda light chains    Lab Results  Component Value Date   WBC 6.9 05/05/2013   NEUTROABS 3.7 05/05/2013   HGB 13.5 05/05/2013   HCT 40.8 05/05/2013   MCV 90.0 05/05/2013   PLT 286  05/05/2013      Chemistry      Component Value Date/Time   NA 137 05/05/2013 0821   NA 132* 05/07/2012 1315   K 4.5 05/05/2013 0821   K 3.8 05/07/2012 1315   CL 106 12/25/2012 0924   CL 100 05/07/2012 1315   CO2 22 05/05/2013 0821   CO2 19 05/07/2012 1315   BUN 12.7 05/05/2013 0821   BUN 15 05/07/2012 1315   CREATININE 0.7 05/05/2013 0821   CREATININE 0.61 05/07/2012 1315      Component Value Date/Time   CALCIUM 9.3 05/05/2013 0821   CALCIUM 8.4 05/07/2012 1315   ALKPHOS 98 05/05/2013 0821   ALKPHOS 97 05/07/2012 1315   AST 13 05/05/2013 0821   AST 41* 05/07/2012 1315   ALT 12 05/05/2013 0821   ALT 18 05/07/2012 1315   BILITOT 0.28 05/05/2013 0821   BILITOT 0.4 05/07/2012 1315     RADIOGRAPHIC STUDIES: I have personally reviewed the radiological images  as listed and agreed with the findings in the report. There were persistent pulmonary nodule, liver lesion and thyroid nodule but no convincing evidence of recurrence of disease  ASSESSMENT:  #1 MGUS #2 stage II colon cancer #3 fibromyalgia  PLAN:  #1 MGUS There is no evidence of end organ damage. I recommend we restage her again with skeletal survey, 24 hour urinary collection and blood work to rule out progression of MGUS. I will order that for next year. #2 stage II colon cancer T3, N0, M0 The patient has completed adjuvant treatment. I will consult Gen. Surgery to have her port removed. CEA is not elevated. She had a colonoscopy in July 2014. We will repeat it again in 2017. I will see her back with history, physical examination, blood work and imaging study in 6 months #3 lung nodule This has been stable. She does have history of smoking and colon cancer. I suggest repeated again in 6 months. #4 liver lesion This is too small to characterize. I will repeat CT scan of the abdomen and pelvis again in 6 months. #5 fibromyalgia I recommend vitamin D supplementation #6 history of hot flashes The patient has been on hormone replacement  therapy for long time. I suggest the patient to consider taper due to risk of breast cancer #7 thyroid nodule This has been stable. We will observe.  Orders Placed This Encounter  Procedures  . DG Bone Survey Met    Standing Status: Future     Number of Occurrences:      Standing Expiration Date: 07/08/2014    Order Specific Question:  Reason for Exam (SYMPTOM  OR DIAGNOSIS REQUIRED)    Answer:  staging myeloma/MGUS    Order Specific Question:  Preferred imaging location?    Answer:  [facility]  . CT Chest W Contrast    Standing Status: Future     Number of Occurrences:      Standing Expiration Date: 05/08/2014    Order Specific Question:  Reason for exam:    Answer:  lung nodule, hx colon cancer and smoking    Order Specific Question:  Preferred imaging location?    Answer:  [facility]  . CT Abdomen Pelvis W Contrast    Standing Status: Future     Number of Occurrences:      Standing Expiration Date: 05/08/2014    Order Specific Question:  Reason for exam:    Answer:  Hx colon ca, s/p resection, liver lesion, r./o progression    Order Specific Question:  Preferred imaging location?    Answer:  [facility]  . Beta 2 microglobuline, serum    Standing Status: Future     Number of Occurrences:      Standing Expiration Date: 05/08/2014  . SPEP & IFE with QIG    Standing Status: Future     Number of Occurrences:      Standing Expiration Date: 05/08/2014  . Protein electrophoresis, serum    Standing Status: Future     Number of Occurrences:      Standing Expiration Date: 05/08/2014  . Kappa/lambda light chains    Standing Status: Future     Number of Occurrences:      Standing Expiration Date: 05/08/2014  . Immunofixation interpretive, urine    Standing Status: Future     Number of Occurrences:      Standing Expiration Date: 05/08/2014  . Protein Electro, 24-Hour Urine    Standing Status: Future  Number of Occurrences:      Standing Expiration  Date: 05/08/2014  . IFE, Urine (with Tot Prot)    Standing Status: Future     Number of Occurrences:      Standing Expiration Date: 05/08/2014  . Comprehensive metabolic panel    Standing Status: Future     Number of Occurrences:      Standing Expiration Date: 05/08/2014  . CBC with Differential    Standing Status: Future     Number of Occurrences:      Standing Expiration Date: 01/28/2014  . CEA    Standing Status: Future     Number of Occurrences:      Standing Expiration Date: 05/08/2014  . Ambulatory referral to General Surgery    Referral Priority:  Routine    Referral Type:  Surgical    Referral Reason:  Specialty Services Required    Requested Specialty:  General Surgery    Number of Visits Requested:  1   All questions were answered. The patient knows to call the clinic with any problems, questions or concerns. No barriers to learning was detected.    Ellamay Fors, MD 05/08/2013 9:19 AM

## 2013-05-08 NOTE — Telephone Encounter (Signed)
Per 11/6 POF appts made for lab and ov ret in 6 mo Called [facility] Dr Dwain Sarna port removal appt made for 06/03/13 avs and cal given to pt   shh

## 2013-06-03 ENCOUNTER — Encounter (INDEPENDENT_AMBULATORY_CARE_PROVIDER_SITE_OTHER): Payer: Self-pay | Admitting: General Surgery

## 2013-06-03 ENCOUNTER — Encounter (INDEPENDENT_AMBULATORY_CARE_PROVIDER_SITE_OTHER): Payer: Self-pay

## 2013-06-03 ENCOUNTER — Ambulatory Visit (INDEPENDENT_AMBULATORY_CARE_PROVIDER_SITE_OTHER): Payer: BC Managed Care – PPO | Admitting: General Surgery

## 2013-06-03 VITALS — BP 136/72 | HR 70 | Resp 18 | Ht 69.0 in | Wt 187.0 lb

## 2013-06-03 DIAGNOSIS — C189 Malignant neoplasm of colon, unspecified: Secondary | ICD-10-CM

## 2013-06-03 NOTE — Progress Notes (Signed)
Patient ID: Rachel Clark, female   DOB: [birth year], 62 y.o.   MRN: [id number]  Chief Complaint  Patient presents with  . Follow-up    discuss PAC removal    HPI Rachel Clark is a 62 y.o. female.   HPI This is a 62 year old female with multiple medical problems who is partially doing very well since I last saw her. Rachel Clark is done with all of her treatment for colon cancer and would like to have her port removed. Rachel Clark reports no interval complaints. Past Medical History  Diagnosis Date  . 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 Dyslipidemia   . Seizure disorder   . History of fibromyalgia   . Chest pain   . Monoclonal gammopathy of unknown significance   . Colon cancer 12/04/2011    s/p Laparoscopic-assisted transverse colectomy on 12/19/2011 by Dr. Dwain Sarna.  pT3 N0 M0.   Marland Kitchen Hyperlipidemia   . Myocardial infarction 2007  . PONV (postoperative nausea and vomiting)   . Bronchitis     hx of;>74yrs  . Headache(784.0)   . Seizures 12/06/11    last seizure over 40 yrs ago;takes Tegretol   . Fibromyalgia   . Arthritis     hands   . GERD (gastroesophageal reflux disease)     takes Pantoprazole daily  . Gastric ulcer   . Iron deficiency anemia 11/17/2011  . Hypothyroid     takes Levothyroxine daily  . Depression     takes Cymbalta daily  . Diarrhea 05/10/2012  . Diarrhea 05/10/2012    Past Surgical History  Procedure Laterality Date  . Partial hysterectomy  20+yrs ago  . Posterior laminectomy / decompression cervical spine      20+yrs ago  . Cardiac catheterization  05/20/2009    obstructive native vessel disease in LAD, RCA, and first diagonal, patent vein graft to distal RCA and LIMA to LAD,normal. ef 60%  . Colonoscopy    . Coronary artery bypass graft  2007    x 2  . Coronary angioplasty      1 stent  . Colon resection  12/19/2011     Procedure: COLON RESECTION LAPAROSCOPIC;  Surgeon: Emelia Loron, MD;  Location: [facility] OR;  Service: General;  Laterality: N/A;  laparoscopic hand assisted partial colon resection  . Portacath placement  06/04/2012    Procedure: INSERTION PORT-A-CATH;  Surgeon: Emelia Loron, MD;  Location: [facility] OR;  Service: General;  Laterality: N/A;  Insertion of port-a-cath     Family History  Problem Relation Age of Onset  . Heart attack Father   . Heart disease Father   . Heart attack Brother 40  . Cancer Mother     lung    Social History History  Substance Use Topics  . Smoking status: Former Smoker -- 0.30 packs/day for 20 years    Types: Cigarettes  . Smokeless tobacco: Never Used     Comment: quit over 4yrs ago  . Alcohol Use: Yes     Comment: occ wine    Allergies  Allergen Reactions  . Aspirin Other (See Comments)    GI upset  . Erythromycin     Gi upset  . Imdur [Isosorbide Mononitrate]     Pt does not recall reaction  . Lisinopril     cough  . Niacin And Related  Pt does not recall reactions   . Penicillins     Gi upset  . Statins     Muscle pain  . Sulfa Drugs Cross Reactors Swelling  . Tetracyclines & Related     Current Outpatient Prescriptions  Medication Sig Dispense Refill  . carbamazepine (TEGRETOL XR) 100 MG 12 hr tablet Take 100-200 mg by mouth 3 (three) times daily. Take 1 cap in the morning, 2 cap at lunch, and 2 cap in the evening      . diphenhydrAMINE (BENADRYL) 25 MG tablet Take 25 mg by mouth every 6 (six) hours as needed. For allergies.      . DULoxetine (CYMBALTA) 60 MG capsule Take 60 mg by mouth every morning.      Marland Kitchen EFFIENT 10 MG TABS TAKE 1 TABLET (10 MG TOTAL) BY MOUTH DAILY.  30 tablet  1  . estradiol (VIVELLE-DOT) 0.075 MG/24HR Place 1 patch onto the skin 2 (two) times a week. On Sundays and Wednesdays.      Marland Kitchen ethosuximide (ZARONTIN) 250 MG capsule Take 250-500 mg by mouth 3 (three) times daily. Take 1 tablet every morning, take 2  tablets every afternoon, and take 2 tablets every night at bedtime.      Marland Kitchen ezetimibe (ZETIA) 10 MG tablet Take 10 mg by mouth at bedtime.      Marland Kitchen levothyroxine (SYNTHROID, LEVOTHROID) 50 MCG tablet Take 50 mcg by mouth daily.       . nitroGLYCERIN (NITROSTAT) 0.4 MG SL tablet Place 0.4 mg under the tongue every 5 (five) minutes as needed. For chest pain.      . pantoprazole (PROTONIX) 40 MG tablet Take 1 tablet (40 mg total) by mouth every morning.  30 tablet  0   No current facility-administered medications for this visit.    Review of Systems Review of Systems  Constitutional: Negative for fever, chills and unexpected weight change.  HENT: Negative for congestion, hearing loss, sore throat, trouble swallowing and voice change.   Eyes: Negative for visual disturbance.  Respiratory: Negative for cough and wheezing.   Cardiovascular: Negative for chest pain, palpitations and leg swelling.  Gastrointestinal: Negative for nausea, vomiting, abdominal pain, diarrhea, constipation, blood in stool, abdominal distention and anal bleeding.  Genitourinary: Negative for hematuria, vaginal bleeding and difficulty urinating.  Musculoskeletal: Negative for arthralgias.  Skin: Negative for rash and wound.  Neurological: Negative for seizures, syncope and headaches.  Hematological: Negative for adenopathy. Does not bruise/bleed easily.  Psychiatric/Behavioral: Negative for confusion.    Blood pressure 136/72, pulse 70, resp. rate 18, height 5\' 9"  (1.753 m), weight 187 lb (84.823 kg).  Physical Exam Physical Exam  Constitutional: Rachel Clark appears well-developed and well-nourished.  Cardiovascular: Normal rate, regular rhythm and normal heart sounds.   Pulmonary/Chest: Effort normal and breath sounds normal. Rachel Clark has no wheezes. Rachel Clark has no rales.        Assessment    No longer needs port HIstory colon cancer     Plan    Port removal  Rachel Clark no longer needs her port. I discussed with her today  going to the operating room and remove her port under local with monitored anesthesia care. We discussed the risks associated with this. We'll schedule this for one week.        Brent Taillon 06/03/2013, 3:16 PM

## 2013-06-06 ENCOUNTER — Encounter (HOSPITAL_BASED_OUTPATIENT_CLINIC_OR_DEPARTMENT_OTHER): Payer: Self-pay | Admitting: *Deleted

## 2013-06-06 NOTE — Progress Notes (Signed)
Pt had cabg 07-sees dr Helayne Seminole over a yr since being tx colon cancer and chemo-no problems-no labs needed

## 2013-06-11 ENCOUNTER — Encounter (HOSPITAL_BASED_OUTPATIENT_CLINIC_OR_DEPARTMENT_OTHER): Payer: Self-pay | Admitting: *Deleted

## 2013-06-12 ENCOUNTER — Encounter (HOSPITAL_BASED_OUTPATIENT_CLINIC_OR_DEPARTMENT_OTHER): Payer: Self-pay | Admitting: Anesthesiology

## 2013-06-12 ENCOUNTER — Ambulatory Visit (HOSPITAL_BASED_OUTPATIENT_CLINIC_OR_DEPARTMENT_OTHER)
Admission: RE | Admit: 2013-06-12 | Discharge: 2013-06-12 | Disposition: A | Discharge status: Home / Self Care | Payer: BC Managed Care – PPO | Source: Ambulatory Visit | Attending: General Surgery | Admitting: General Surgery

## 2013-06-12 ENCOUNTER — Ambulatory Visit (HOSPITAL_BASED_OUTPATIENT_CLINIC_OR_DEPARTMENT_OTHER): Payer: BC Managed Care – PPO | Admitting: Anesthesiology

## 2013-06-12 ENCOUNTER — Encounter (HOSPITAL_BASED_OUTPATIENT_CLINIC_OR_DEPARTMENT_OTHER): Payer: BC Managed Care – PPO | Admitting: Anesthesiology

## 2013-06-12 ENCOUNTER — Encounter (HOSPITAL_BASED_OUTPATIENT_CLINIC_OR_DEPARTMENT_OTHER)
Admission: RE | Disposition: A | Discharge status: Home / Self Care | Payer: Self-pay | Source: Ambulatory Visit | Attending: General Surgery

## 2013-06-12 DIAGNOSIS — Z452 Encounter for adjustment and management of vascular access device: Secondary | ICD-10-CM | POA: Insufficient documentation

## 2013-06-12 DIAGNOSIS — Z85038 Personal history of other malignant neoplasm of large intestine: Secondary | ICD-10-CM | POA: Insufficient documentation

## 2013-06-12 DIAGNOSIS — IMO0001 Reserved for inherently not codable concepts without codable children: Secondary | ICD-10-CM | POA: Insufficient documentation

## 2013-06-12 DIAGNOSIS — Z79899 Other long term (current) drug therapy: Secondary | ICD-10-CM | POA: Insufficient documentation

## 2013-06-12 DIAGNOSIS — E785 Hyperlipidemia, unspecified: Secondary | ICD-10-CM | POA: Insufficient documentation

## 2013-06-12 DIAGNOSIS — I252 Old myocardial infarction: Secondary | ICD-10-CM | POA: Insufficient documentation

## 2013-06-12 DIAGNOSIS — I2510 Atherosclerotic heart disease of native coronary artery without angina pectoris: Secondary | ICD-10-CM | POA: Insufficient documentation

## 2013-06-12 DIAGNOSIS — F329 Major depressive disorder, single episode, unspecified: Secondary | ICD-10-CM | POA: Insufficient documentation

## 2013-06-12 DIAGNOSIS — F3289 Other specified depressive episodes: Secondary | ICD-10-CM | POA: Insufficient documentation

## 2013-06-12 DIAGNOSIS — Z7902 Long term (current) use of antithrombotics/antiplatelets: Secondary | ICD-10-CM | POA: Insufficient documentation

## 2013-06-12 DIAGNOSIS — E039 Hypothyroidism, unspecified: Secondary | ICD-10-CM | POA: Insufficient documentation

## 2013-06-12 DIAGNOSIS — Z9221 Personal history of antineoplastic chemotherapy: Secondary | ICD-10-CM | POA: Insufficient documentation

## 2013-06-12 DIAGNOSIS — K219 Gastro-esophageal reflux disease without esophagitis: Secondary | ICD-10-CM | POA: Insufficient documentation

## 2013-06-12 DIAGNOSIS — Z87891 Personal history of nicotine dependence: Secondary | ICD-10-CM | POA: Insufficient documentation

## 2013-06-12 HISTORY — PX: PORT-A-CATH REMOVAL: SHX5289

## 2013-06-12 LAB — POCT HEMOGLOBIN-HEMACUE: Hemoglobin: 13.1 g/dL (ref 12.0–15.0)

## 2013-06-12 SURGERY — REMOVAL PORT-A-CATH
Anesthesia: Monitor Anesthesia Care | Site: Chest

## 2013-06-12 MED ORDER — METOCLOPRAMIDE HCL 5 MG/ML IJ SOLN: 10.0000 mg | Freq: Once | INTRAMUSCULAR | Status: DC | PRN

## 2013-06-12 MED ORDER — LACTATED RINGERS IV SOLN
INTRAVENOUS | Status: DC
  Administered 2013-06-12: 08:00:00 via INTRAVENOUS

## 2013-06-12 MED ORDER — FENTANYL CITRATE 0.05 MG/ML IJ SOLN: 25.0000 ug | INTRAMUSCULAR | Status: DC | PRN

## 2013-06-12 MED ORDER — LIDOCAINE-EPINEPHRINE (PF) 1 %-1:200000 IJ SOLN
INTRAMUSCULAR | Status: AC
  Filled 2013-06-12: qty 10

## 2013-06-12 MED ORDER — MIDAZOLAM HCL 5 MG/5ML IJ SOLN
INTRAMUSCULAR | Status: DC | PRN
  Administered 2013-06-12: 2 mg via INTRAVENOUS

## 2013-06-12 MED ORDER — LIDOCAINE-EPINEPHRINE (PF) 1 %-1:200000 IJ SOLN
INTRAMUSCULAR | Status: DC | PRN
  Administered 2013-06-12: 09:00:00

## 2013-06-12 MED ORDER — OXYCODONE-ACETAMINOPHEN 10-325 MG PO TABS: 1.0000 | ORAL_TABLET | Freq: Four times a day (QID) | ORAL | Status: DC | PRN

## 2013-06-12 MED ORDER — PROPOFOL INFUSION 10 MG/ML OPTIME
INTRAVENOUS | Status: DC | PRN
  Administered 2013-06-12: 75 ug/kg/min via INTRAVENOUS

## 2013-06-12 MED ORDER — MIDAZOLAM HCL 2 MG/2ML IJ SOLN: 1.0000 mg | INTRAMUSCULAR | Status: DC | PRN

## 2013-06-12 MED ORDER — FENTANYL CITRATE 0.05 MG/ML IJ SOLN
INTRAMUSCULAR | Status: DC | PRN
  Administered 2013-06-12: 100 ug via INTRAVENOUS

## 2013-06-12 MED ORDER — DEXAMETHASONE SODIUM PHOSPHATE 10 MG/ML IJ SOLN
INTRAMUSCULAR | Status: DC | PRN
  Administered 2013-06-12: 10 mg via INTRAVENOUS

## 2013-06-12 MED ORDER — OXYCODONE HCL 5 MG/5ML PO SOLN: 5.0000 mg | Freq: Once | ORAL | Status: DC | PRN

## 2013-06-12 MED ORDER — OXYCODONE HCL 5 MG PO TABS: 5.0000 mg | ORAL_TABLET | Freq: Once | ORAL | Status: DC | PRN

## 2013-06-12 MED ORDER — FENTANYL CITRATE 0.05 MG/ML IJ SOLN: 50.0000 ug | INTRAMUSCULAR | Status: DC | PRN

## 2013-06-12 MED ORDER — MIDAZOLAM HCL 2 MG/2ML IJ SOLN
INTRAMUSCULAR | Status: AC
  Filled 2013-06-12: qty 2

## 2013-06-12 MED ORDER — LIDOCAINE HCL (CARDIAC) 20 MG/ML IV SOLN
INTRAVENOUS | Status: DC | PRN
  Administered 2013-06-12: 50 mg via INTRAVENOUS

## 2013-06-12 MED ORDER — ONDANSETRON HCL 4 MG/2ML IJ SOLN
INTRAMUSCULAR | Status: DC | PRN
  Administered 2013-06-12: 4 mg via INTRAVENOUS

## 2013-06-12 MED ORDER — FENTANYL CITRATE 0.05 MG/ML IJ SOLN
INTRAMUSCULAR | Status: AC
  Filled 2013-06-12: qty 6

## 2013-06-12 SURGICAL SUPPLY — 29 items
ADH SKN CLS APL DERMABOND .7 (GAUZE/BANDAGES/DRESSINGS) ×1
BLADE SURG 15 STRL LF DISP TIS (BLADE) ×1 IMPLANT
BLADE SURG 15 STRL SS (BLADE) ×2
CHLORAPREP W/TINT 26ML (MISCELLANEOUS) ×2 IMPLANT
COVER MAYO STAND STRL (DRAPES) ×2 IMPLANT
COVER TABLE BACK 60X90 (DRAPES) ×2 IMPLANT
DECANTER SPIKE VIAL GLASS SM (MISCELLANEOUS) ×2 IMPLANT
DERMABOND ADVANCED (GAUZE/BANDAGES/DRESSINGS) ×1
DERMABOND ADVANCED .7 DNX12 (GAUZE/BANDAGES/DRESSINGS) ×1 IMPLANT
DRAPE PED LAPAROTOMY (DRAPES) ×2 IMPLANT
ELECT COATED BLADE 2.86 ST (ELECTRODE) ×2 IMPLANT
ELECT REM PT RETURN 9FT ADLT (ELECTROSURGICAL) ×2
ELECTRODE REM PT RTRN 9FT ADLT (ELECTROSURGICAL) ×1 IMPLANT
GLOVE BIO SURGEON STRL SZ 6.5 (GLOVE) ×1 IMPLANT
GLOVE BIO SURGEON STRL SZ7 (GLOVE) ×2 IMPLANT
GLOVE BIOGEL PI IND STRL 7.0 (GLOVE) IMPLANT
GLOVE BIOGEL PI INDICATOR 7.0 (GLOVE) ×1
GOWN PREVENTION PLUS XLARGE (GOWN DISPOSABLE) ×3 IMPLANT
NDL HYPO 25X1 1.5 SAFETY (NEEDLE) ×1 IMPLANT
NEEDLE HYPO 25X1 1.5 SAFETY (NEEDLE) ×2 IMPLANT
PACK BASIN DAY SURGERY FS (CUSTOM PROCEDURE TRAY) ×2 IMPLANT
PENCIL BUTTON HOLSTER BLD 10FT (ELECTRODE) ×2 IMPLANT
SLEEVE SCD COMPRESS KNEE MED (MISCELLANEOUS) IMPLANT
SUT MON AB 4-0 PC3 18 (SUTURE) ×2 IMPLANT
SUT VIC AB 3-0 SH 27 (SUTURE) ×2
SUT VIC AB 3-0 SH 27X BRD (SUTURE) ×1 IMPLANT
SYR CONTROL 10ML LL (SYRINGE) ×2 IMPLANT
TOWEL OR 17X24 6PK STRL BLUE (TOWEL DISPOSABLE) ×2 IMPLANT
TOWEL OR NON WOVEN STRL DISP B (DISPOSABLE) ×2 IMPLANT

## 2013-06-12 NOTE — Anesthesia Procedure Notes (Signed)
Procedure Name: MAC Date/Time: 06/12/2013 8:39 AM Performed by: Caren Macadam Pre-anesthesia Checklist: Patient identified, Emergency Drugs available, Suction available and Patient being monitored Patient Re-evaluated:Patient Re-evaluated prior to inductionOxygen Delivery Method: Simple face mask Preoxygenation: Pre-oxygenation with 100% oxygen Intubation Type: IV induction Placement Confirmation: breath sounds checked- equal and bilateral and positive ETCO2

## 2013-06-12 NOTE — Anesthesia Preprocedure Evaluation (Signed)
Anesthesia Evaluation  Patient identified by MRN, date of birth, ID band Patient awake    Reviewed: Allergy & Precautions, H&P , NPO status , Patient's Chart, lab work & pertinent test results, reviewed documented beta blocker date and time   Airway Mallampati: II TM Distance: >3 FB Neck ROM: full    Dental   Pulmonary former smoker,  breath sounds clear to auscultation        Cardiovascular + CAD, + Past MI and + CABG Rhythm:regular     Neuro/Psych  Headaches, Seizures -,   Neuromuscular disease negative psych ROS   GI/Hepatic Neg liver ROS, PUD, GERD-  Medicated and Controlled,  Endo/Other  Hypothyroidism   Renal/GU negative Renal ROS  negative genitourinary   Musculoskeletal   Abdominal   Peds  Hematology  (+) anemia ,   Anesthesia Other Findings See surgeon's H&P   Reproductive/Obstetrics negative OB ROS                           Anesthesia Physical Anesthesia Plan  ASA: III  Anesthesia Plan: General and MAC   Post-op Pain Management:    Induction: Intravenous  Airway Management Planned: LMA  Additional Equipment:   Intra-op Plan:   Post-operative Plan:   Informed Consent: I have reviewed the patients History and Physical, chart, labs and discussed the procedure including the risks, benefits and alternatives for the proposed anesthesia with the patient or authorized representative who has indicated his/her understanding and acceptance.   Dental Advisory Given  Plan Discussed with: CRNA and Surgeon  Anesthesia Plan Comments:         Anesthesia Quick Evaluation

## 2013-06-12 NOTE — Op Note (Signed)
Preoperative diagnosis: Colon cancer with port in place, and no longer needs venous access Postoperative diagnosis: Same as above Procedure: Left subclavian port removal Surgeon: Dr. Harden Mo Anesthesia: Local with monitored anesthesia care Specimens: None Drains: None Consultations: None Estimated blood loss: Minimal Sponge count correct at completion Disposition to recovery stable  Indications: This is a 62 year old female who I treated for colon cancer who is done with her adjuvant therapy. She desires her port to be removed.  Procedure: After informed consent was obtained the patient was taken to the operating room. She was placed under monitored anesthesia care. Her left chest was prepped and draped in the standard sterile surgical fashion. A surgical timeout was performed.  I infiltrated a mixture of 1% lidocaine with epinephrine and quarter percent Marcaine in the region of her port. I then made an incision at the site of her old scar. The port was then removed in its entirety. Hemostasis was observed. I then closed this with 3-0 Vicryl, 4-0 Monocryl, and Dermabond. She tolerated this well was transferred to recovery stable.

## 2013-06-12 NOTE — Anesthesia Postprocedure Evaluation (Signed)
Anesthesia Post Note  Patient: Rachel Clark  Procedure(s) Performed: Procedure(s) (LRB): REMOVAL PORT-A-CATH (N/A)  Anesthesia type: General  Patient location: PACU  Post pain: Pain level controlled  Post assessment: Patient's Cardiovascular Status Stable  Last Vitals:  Filed Vitals:   06/12/13 1001  BP: 125/69  Pulse: 79  Temp: 36.4 C  Resp:     Post vital signs: Reviewed and stable  Level of consciousness: alert  Complications: No apparent anesthesia complications

## 2013-06-12 NOTE — Interval H&P Note (Signed)
History and Physical Interval Note:  06/12/2013 8:16 AM  Rachel Clark  has presented today for surgery, with the diagnosis of colon cancer  The various methods of treatment have been discussed with the patient and family. After consideration of risks, benefits and other options for treatment, the patient has consented to  Procedure(s): REMOVAL PORT-A-CATH (N/A) as a surgical intervention .  The patient's history has been reviewed, patient examined, no change in status, stable for surgery.  I have reviewed the patient's chart and labs.  Questions were answered to the patient's satisfaction.     [name]

## 2013-06-12 NOTE — Transfer of Care (Signed)
Immediate Anesthesia Transfer of Care Note  Patient: Rachel Clark  Procedure(s) Performed: Procedure(s): REMOVAL PORT-A-CATH (N/A)  Patient Location: PACU  Anesthesia Type:MAC  Level of Consciousness: awake and alert   Airway & Oxygen Therapy: Patient Spontanous Breathing and Patient connected to face mask oxygen  Post-op Assessment: Report given to PACU RN and Post -op Vital signs reviewed and stable  Post vital signs: Reviewed and stable  Complications: No apparent anesthesia complications

## 2013-06-12 NOTE — H&P (View-Only) (Signed)
Patient ID: Rachel Clark, female   DOB: 06/09/1951, 62 y.o.   MRN: [id number]  Chief Complaint  Patient presents with  . Follow-up    discuss PAC removal    HPI Rachel Clark is a 62 y.o. female.   HPI This is a 62-year-old female with multiple medical problems who is partially doing very well since I last saw her. She is done with all of her treatment for colon cancer and would like to have her port removed. She reports no interval complaints. Past Medical History  Diagnosis Date  . Coronary artery disease     Has had remote NSTEMI with PCI to the RCA; S/P CABG in 2007; LAST CATH WITH MODERATE DISEASE IN A VERY SMALL DIAGONAL BRANCH; SHE IS MANAGED MEDICALLY  . Dyslipidemia   . Seizure disorder   . History of fibromyalgia   . Chest pain   . Monoclonal gammopathy of unknown significance   . Colon cancer 12/04/2011    s/p Laparoscopic-assisted transverse colectomy on 12/19/2011 by Dr. Katalia Choma.  pT3 N0 M0.   . Hyperlipidemia   . Myocardial infarction 2007  . PONV (postoperative nausea and vomiting)   . Bronchitis     hx of;>6yrs  . Headache(784.0)   . Seizures 12/06/11    last seizure over 40 yrs ago;takes Tegretol   . Fibromyalgia   . Arthritis     hands   . GERD (gastroesophageal reflux disease)     takes Pantoprazole daily  . Gastric ulcer   . Iron deficiency anemia 11/17/2011  . Hypothyroid     takes Levothyroxine daily  . Depression     takes Cymbalta daily  . Diarrhea 05/10/2012  . Diarrhea 05/10/2012    Past Surgical History  Procedure Laterality Date  . Partial hysterectomy  20+yrs ago  . Posterior laminectomy / decompression cervical spine      20+yrs ago  . Cardiac catheterization  05/20/2009    obstructive native vessel disease in LAD, RCA, and first diagonal, patent vein graft to distal RCA and LIMA to LAD,normal. ef 60%  . Colonoscopy    . Coronary artery bypass graft  2007    x 2  . Coronary angioplasty      1 stent  . Colon resection  12/19/2011     Procedure: COLON RESECTION LAPAROSCOPIC;  Surgeon: Bowie Delia, MD;  Location: [facility] OR;  Service: General;  Laterality: N/A;  laparoscopic hand assisted partial colon resection  . Portacath placement  06/04/2012    Procedure: INSERTION PORT-A-CATH;  Surgeon: Zaydin Billey, MD;  Location: [facility] OR;  Service: General;  Laterality: N/A;  Insertion of port-a-cath     Family History  Problem Relation Age of Onset  . Heart attack Father   . Heart disease Father   . Heart attack Brother 40  . Cancer Mother     lung    Social History History  Substance Use Topics  . Smoking status: Former Smoker -- 0.30 packs/day for 20 years    Types: Cigarettes  . Smokeless tobacco: Never Used     Comment: quit over 20yrs ago  . Alcohol Use: Yes     Comment: occ wine    Allergies  Allergen Reactions  . Aspirin Other (See Comments)    GI upset  . Erythromycin     Gi upset  . Imdur [Isosorbide Mononitrate]     Pt does not recall reaction  . Lisinopril     cough  . Niacin And Related       Pt does not recall reactions   . Penicillins     Gi upset  . Statins     Muscle pain  . Sulfa Drugs Cross Reactors Swelling  . Tetracyclines & Related     Current Outpatient Prescriptions  Medication Sig Dispense Refill  . carbamazepine (TEGRETOL XR) 100 MG 12 hr tablet Take 100-200 mg by mouth 3 (three) times daily. Take 1 cap in the morning, 2 cap at lunch, and 2 cap in the evening      . diphenhydrAMINE (BENADRYL) 25 MG tablet Take 25 mg by mouth every 6 (six) hours as needed. For allergies.      . DULoxetine (CYMBALTA) 60 MG capsule Take 60 mg by mouth every morning.      . EFFIENT 10 MG TABS TAKE 1 TABLET (10 MG TOTAL) BY MOUTH DAILY.  30 tablet  1  . estradiol (VIVELLE-DOT) 0.075 MG/24HR Place 1 patch onto the skin 2 (two) times a week. On Sundays and Wednesdays.      . ethosuximide (ZARONTIN) 250 MG capsule Take 250-500 mg by mouth 3 (three) times daily. Take 1 tablet every morning, take 2  tablets every afternoon, and take 2 tablets every night at bedtime.      . ezetimibe (ZETIA) 10 MG tablet Take 10 mg by mouth at bedtime.      . levothyroxine (SYNTHROID, LEVOTHROID) 50 MCG tablet Take 50 mcg by mouth daily.       . nitroGLYCERIN (NITROSTAT) 0.4 MG SL tablet Place 0.4 mg under the tongue every 5 (five) minutes as needed. For chest pain.      . pantoprazole (PROTONIX) 40 MG tablet Take 1 tablet (40 mg total) by mouth every morning.  30 tablet  0   No current facility-administered medications for this visit.    Review of Systems Review of Systems  Constitutional: Negative for fever, chills and unexpected weight change.  HENT: Negative for congestion, hearing loss, sore throat, trouble swallowing and voice change.   Eyes: Negative for visual disturbance.  Respiratory: Negative for cough and wheezing.   Cardiovascular: Negative for chest pain, palpitations and leg swelling.  Gastrointestinal: Negative for nausea, vomiting, abdominal pain, diarrhea, constipation, blood in stool, abdominal distention and anal bleeding.  Genitourinary: Negative for hematuria, vaginal bleeding and difficulty urinating.  Musculoskeletal: Negative for arthralgias.  Skin: Negative for rash and wound.  Neurological: Negative for seizures, syncope and headaches.  Hematological: Negative for adenopathy. Does not bruise/bleed easily.  Psychiatric/Behavioral: Negative for confusion.    Blood pressure 136/72, pulse 70, resp. rate 18, height 5' 9" (1.753 m), weight 187 lb (84.823 kg).  Physical Exam Physical Exam  Constitutional: She appears well-developed and well-nourished.  Cardiovascular: Normal rate, regular rhythm and normal heart sounds.   Pulmonary/Chest: Effort normal and breath sounds normal. She has no wheezes. She has no rales.        Assessment    No longer needs port HIstory colon cancer     Plan    Port removal  She no longer needs her port. I discussed with her today  going to the operating room and remove her port under local with monitored anesthesia care. We discussed the risks associated with this. We'll schedule this for one week.        Cinnamon Morency 06/03/2013, 3:16 PM    

## 2013-06-13 ENCOUNTER — Encounter (HOSPITAL_BASED_OUTPATIENT_CLINIC_OR_DEPARTMENT_OTHER): Payer: Self-pay | Admitting: General Surgery

## 2013-06-23 ENCOUNTER — Ambulatory Visit (INDEPENDENT_AMBULATORY_CARE_PROVIDER_SITE_OTHER): Payer: BC Managed Care – PPO | Admitting: General Surgery

## 2013-06-23 ENCOUNTER — Encounter (INDEPENDENT_AMBULATORY_CARE_PROVIDER_SITE_OTHER): Payer: Self-pay | Admitting: General Surgery

## 2013-06-23 VITALS — BP 128/70 | HR 84 | Temp 97.0°F | Resp 14 | Ht 69.0 in | Wt 187.6 lb

## 2013-06-23 DIAGNOSIS — Z09 Encounter for follow-up examination after completed treatment for conditions other than malignant neoplasm: Secondary | ICD-10-CM

## 2013-06-23 NOTE — Progress Notes (Signed)
Subjective:     Patient ID: Rachel Clark, female   DOB: 1950/10/19, 62 y.o.   MRN: [id number]  HPI 5 yof I know from colectomy for colon cancer who was done with adjuvant therapy. I recently removed her port and she has done well from that.  She returns today with no complaints  Review of Systems     Objective:   Physical Exam    left chest incision healing well without infection Assessment:     S/p port removal     Plan:     She to return to normal activity and I will see her back as needed.

## 2013-06-26 ENCOUNTER — Other Ambulatory Visit: Payer: Self-pay | Admitting: Neurology

## 2013-09-09 ENCOUNTER — Other Ambulatory Visit: Payer: Self-pay | Admitting: Neurology

## 2013-10-23 ENCOUNTER — Telehealth: Payer: Self-pay | Admitting: Hematology and Oncology

## 2013-10-23 NOTE — Telephone Encounter (Signed)
returned pt called and lvm conf appts

## 2013-10-29 ENCOUNTER — Other Ambulatory Visit (HOSPITAL_BASED_OUTPATIENT_CLINIC_OR_DEPARTMENT_OTHER): Payer: BC Managed Care – PPO

## 2013-10-29 ENCOUNTER — Encounter (HOSPITAL_COMMUNITY): Payer: Self-pay

## 2013-10-29 ENCOUNTER — Ambulatory Visit (HOSPITAL_COMMUNITY)
Admission: RE | Admit: 2013-10-29 | Discharge: 2013-10-29 | Disposition: A | Discharge status: Home / Self Care | Payer: BC Managed Care – PPO | Source: Ambulatory Visit | Attending: Hematology and Oncology | Admitting: Hematology and Oncology

## 2013-10-29 DIAGNOSIS — D472 Monoclonal gammopathy: Secondary | ICD-10-CM

## 2013-10-29 DIAGNOSIS — C189 Malignant neoplasm of colon, unspecified: Secondary | ICD-10-CM

## 2013-10-29 DIAGNOSIS — C9000 Multiple myeloma not having achieved remission: Secondary | ICD-10-CM | POA: Insufficient documentation

## 2013-10-29 DIAGNOSIS — C184 Malignant neoplasm of transverse colon: Secondary | ICD-10-CM

## 2013-10-29 LAB — CBC WITH DIFFERENTIAL/PLATELET
BASO%: 0.4 % (ref 0.0–2.0)
Basophils Absolute: 0 10*3/uL (ref 0.0–0.1)
EOS%: 5.4 % (ref 0.0–7.0)
Eosinophils Absolute: 0.4 10*3/uL (ref 0.0–0.5)
HEMATOCRIT: 42.7 % (ref 34.8–46.6)
HGB: 14.2 g/dL (ref 11.6–15.9)
LYMPH#: 2 10*3/uL (ref 0.9–3.3)
LYMPH%: 27.8 % (ref 14.0–49.7)
MCH: 30.2 pg (ref 25.1–34.0)
MCHC: 33.4 g/dL (ref 31.5–36.0)
MCV: 90.7 fL (ref 79.5–101.0)
MONO#: 0.6 10*3/uL (ref 0.1–0.9)
MONO%: 7.8 % (ref 0.0–14.0)
NEUT#: 4.2 10*3/uL (ref 1.5–6.5)
NEUT%: 58.6 % (ref 38.4–76.8)
Platelets: 276 10*3/uL (ref 145–400)
RBC: 4.71 10*6/uL (ref 3.70–5.45)
RDW: 13.2 % (ref 11.2–14.5)
WBC: 7.2 10*3/uL (ref 3.9–10.3)

## 2013-10-29 LAB — COMPREHENSIVE METABOLIC PANEL (CC13)
ALT: 12 U/L (ref 0–55)
ANION GAP: 11 meq/L (ref 3–11)
AST: 12 U/L (ref 5–34)
Albumin: 3.8 g/dL (ref 3.5–5.0)
Alkaline Phosphatase: 108 U/L (ref 40–150)
BUN: 17.8 mg/dL (ref 7.0–26.0)
CALCIUM: 9.4 mg/dL (ref 8.4–10.4)
CHLORIDE: 103 meq/L (ref 98–109)
CO2: 25 mEq/L (ref 22–29)
Creatinine: 0.8 mg/dL (ref 0.6–1.1)
Glucose: 98 mg/dl (ref 70–140)
Potassium: 4.7 mEq/L (ref 3.5–5.1)
SODIUM: 139 meq/L (ref 136–145)
TOTAL PROTEIN: 7.7 g/dL (ref 6.4–8.3)
Total Bilirubin: 0.26 mg/dL (ref 0.20–1.20)

## 2013-10-29 MED ORDER — IOHEXOL 300 MG/ML  SOLN
100.0000 mL | Freq: Once | INTRAMUSCULAR | Status: AC | PRN
Start: 2013-10-29 — End: 2013-10-29
  Administered 2013-10-29: 100 mL via INTRAVENOUS

## 2013-10-31 LAB — KAPPA/LAMBDA LIGHT CHAINS
Kappa free light chain: 1.87 mg/dL (ref 0.33–1.94)
Kappa:Lambda Ratio: 0.57 (ref 0.26–1.65)
Lambda Free Lght Chn: 3.26 mg/dL — ABNORMAL HIGH (ref 0.57–2.63)

## 2013-10-31 LAB — CEA: CEA: 1.6 ng/mL (ref 0.0–5.0)

## 2013-10-31 LAB — SPEP & IFE WITH QIG
ALPHA-1-GLOBULIN: 4.6 % (ref 2.9–4.9)
Albumin ELP: 57 % (ref 55.8–66.1)
Alpha-2-Globulin: 11.4 % (ref 7.1–11.8)
Beta 2: 4.7 % (ref 3.2–6.5)
Beta Globulin: 5.1 % (ref 4.7–7.2)
GAMMA GLOBULIN: 17.2 % (ref 11.1–18.8)
IGA: 182 mg/dL (ref 69–380)
IGG (IMMUNOGLOBIN G), SERUM: 1250 mg/dL (ref 690–1700)
IGM, SERUM: 108 mg/dL (ref 52–322)
M-SPIKE, %: 0.6 g/dL
Total Protein, Serum Electrophoresis: 7.2 g/dL (ref 6.0–8.3)

## 2013-10-31 LAB — BETA 2 MICROGLOBULIN, SERUM: BETA 2 MICROGLOBULIN: 1.71 mg/L (ref <=2.51)

## 2013-11-05 ENCOUNTER — Ambulatory Visit (HOSPITAL_BASED_OUTPATIENT_CLINIC_OR_DEPARTMENT_OTHER): Payer: BC Managed Care – PPO | Admitting: Hematology and Oncology

## 2013-11-05 VITALS — BP 141/77 | HR 78 | Temp 97.2°F | Resp 18 | Ht 69.0 in | Wt 194.0 lb

## 2013-11-05 DIAGNOSIS — E041 Nontoxic single thyroid nodule: Secondary | ICD-10-CM

## 2013-11-05 DIAGNOSIS — C184 Malignant neoplasm of transverse colon: Secondary | ICD-10-CM

## 2013-11-05 DIAGNOSIS — C189 Malignant neoplasm of colon, unspecified: Secondary | ICD-10-CM

## 2013-11-05 DIAGNOSIS — IMO0001 Reserved for inherently not codable concepts without codable children: Secondary | ICD-10-CM

## 2013-11-05 DIAGNOSIS — R911 Solitary pulmonary nodule: Secondary | ICD-10-CM

## 2013-11-05 DIAGNOSIS — D472 Monoclonal gammopathy: Secondary | ICD-10-CM

## 2013-11-05 DIAGNOSIS — K7689 Other specified diseases of liver: Secondary | ICD-10-CM

## 2013-11-05 LAB — UPEP/TP, 24-HR URINE
Collection Interval: 24 hours
Total Protein, Urine/Day: 62 mg/d (ref 50–100)
Total Protein, Urine: 4 mg/dL
Total Volume, Urine: 1550 mL

## 2013-11-05 LAB — UIFE/LIGHT CHAINS/TP QN, 24-HR UR
ALPHA 2 UR: DETECTED — AB
Albumin, U: DETECTED
Alpha 1, Urine: DETECTED — AB
BETA UR: DETECTED — AB
FREE KAPPA/LAMBDA RATIO: 19.04 ratio — AB (ref 2.04–10.37)
Free Kappa Lt Chains,Ur: 4.95 mg/dL — ABNORMAL HIGH (ref 0.14–2.42)
Free Lambda Excretion/Day: 4.03 mg/d
Free Lambda Lt Chains,Ur: 0.26 mg/dL (ref 0.02–0.67)
Free Lt Chn Excr Rate: 76.73 mg/d
Gamma Globulin, Urine: DETECTED — AB
TOTAL PROTEIN, URINE-UPE24: 5.7 mg/dL
TOTAL PROTEIN, URINE-UR/DAY: 88 mg/d (ref 10–140)
Time: 24 hours
Volume, Urine: 1550 mL

## 2013-11-05 NOTE — Progress Notes (Signed)
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 The patient have very minimal M spike and asymptomatic. #2 T3, N0, M0 colon cancer This was discovered when the patient presented with severe anemia. Colonoscopy revealed abnormal lesion and in 12/19/2011 Dr. Donne Hazel perform laparoscopic-assisted transverse colectomy. She received adjuvant Xeloda in August 2013 and switched to IV 5-FU in December 2013 due to poor tolerance to Xeloda. Her last chemotherapy is completed by March 2014. She has normal colonoscopy in July 2014. CT scan from 10/29/2013 show no evidence of recurrence.  INTERVAL HISTORY: ELAISHA ZAHNISER 63 y.o. female returns for further followup. She denies altered bowel habits. Denies any recent infection. She has chronic musculoskeletal pain due to fibromyalgia. She is not taking vitamin D supplementation.  I have reviewed the past medical history, past surgical history, social history and family history with the patient and they are unchanged from previous note.  ALLERGIES:  is allergic to aspirin; erythromycin; imdur; lisinopril; niacin and related; penicillins; statins; sulfa drugs cross reactors; and tetracyclines & related.  MEDICATIONS:  Current Outpatient Prescriptions  Medication Sig Dispense Refill  . diphenhydrAMINE (BENADRYL) 25 MG tablet Take 25 mg by mouth every 6 (six) hours as needed. For allergies.      . DULoxetine (CYMBALTA) 60 MG capsule Take 60 mg by mouth every morning.      Marland Kitchen EFFIENT  10 MG TABS TAKE 1 TABLET (10 MG TOTAL) BY MOUTH DAILY.  30 tablet  1  . estradiol (VIVELLE-DOT) 0.075 MG/24HR Place 1 patch onto the skin 2 (two) times a week. On Sundays and Wednesdays.      Marland Kitchen ethosuximide (ZARONTIN) 250 MG capsule TAKE AS INSTRUCTED BY YOUR PRESCRIBER  540 capsule  0  . ezetimibe (ZETIA) 10 MG tablet Take 10 mg by mouth at bedtime.      Marland Kitchen levothyroxine (SYNTHROID, LEVOTHROID) 50 MCG tablet Take 50 mcg by mouth daily.       . nitroGLYCERIN (NITROSTAT) 0.4 MG SL tablet Place 0.4 mg under the tongue every 5 (five) minutes as needed. For chest pain.      . pantoprazole (PROTONIX) 40 MG tablet Take 1 tablet (40 mg total) by mouth every morning.  30 tablet  0  . TEGRETOL-XR 100 MG 12 hr tablet TAKE 5 TABLETS TWICE A DAY. (PLEASE SCHEDULE AN APPOINTMENT.)  900 tablet  0   No current facility-administered medications for this visit.    REVIEW OF SYSTEMS:   Constitutional: Denies fevers, chills or abnormal weight loss Eyes: Denies blurriness of vision Ears, nose, mouth, throat, and face: Denies mucositis or sore throat Respiratory: Denies cough, dyspnea or wheezes Cardiovascular: Denies palpitation, chest discomfort or lower extremity swelling Gastrointestinal:  Denies nausea, heartburn or change in bowel habits Skin: Denies abnormal skin rashes Lymphatics: Denies new lymphadenopathy or easy bruising Neurological:Denies numbness, tingling or new weaknesses Behavioral/Psych: Mood is stable, no new changes  All other systems were reviewed with the patient and are negative.  PHYSICAL EXAMINATION: ECOG PERFORMANCE STATUS: 1 - Symptomatic but completely ambulatory  Filed Vitals:   11/05/13 0842  BP: 141/77  Pulse: 78  Temp: 97.2 F (36.2 C)  Resp: 18   Filed Weights   11/05/13 0842  Weight: 194 lb (87.998 kg)    GENERAL:alert, no distress and comfortable. She is obese.Marland Kitchen  SKIN: skin color, texture, turgor are normal, no rashes or significant lesions EYES: normal,  Conjunctiva are pink and non-injected, sclera clear OROPHARYNX:no exudate, no erythema and lips, buccal mucosa, and tongue normal  NECK: supple, thyroid normal size, non-tender, without nodularity LYMPH:  no palpable lymphadenopathy in the cervical, axillary or inguinal LUNGS: clear to auscultation and percussion with normal breathing effort HEART: regular rate & rhythm and no murmurs and no lower extremity edema ABDOMEN:abdomen soft, non-tender and normal bowel sounds. Well-healed surgical scar with no abnormalities Musculoskeletal:no cyanosis of digits and no clubbing  NEURO: alert & oriented x 3 with fluent speech, no focal motor/sensory deficits  LABORATORY DATA:  I have reviewed the data as listed    Component Value Date/Time   NA 139 10/29/2013 0825   NA 132* 05/07/2012 1315   K 4.7 10/29/2013 0825   K 3.8 05/07/2012 1315   CL 106 12/25/2012 0924   CL 100 05/07/2012 1315   CO2 25 10/29/2013 0825   CO2 19 05/07/2012 1315   GLUCOSE 98 10/29/2013 0825   GLUCOSE 105* 12/25/2012 0924   GLUCOSE 115* 05/07/2012 1315   BUN 17.8 10/29/2013 0825   BUN 15 05/07/2012 1315   CREATININE 0.8 10/29/2013 0825   CREATININE 0.61 05/07/2012 1315   CALCIUM 9.4 10/29/2013 0825   CALCIUM 8.4 05/07/2012 1315   PROT 7.7 10/29/2013 0825   PROT 6.5 05/07/2012 1315   ALBUMIN 3.8 10/29/2013 0825   ALBUMIN 3.4* 05/07/2012 1315   AST 12 10/29/2013 0825   AST 41* 05/07/2012 1315   ALT 12 10/29/2013 0825   ALT 18 05/07/2012 1315   ALKPHOS 108 10/29/2013 0825   ALKPHOS 97 05/07/2012 1315   BILITOT 0.26 10/29/2013 0825   BILITOT 0.4 05/07/2012 1315   GFRNONAA >90 05/07/2012 1315   GFRAA >90 05/07/2012 1315    No results found for this basename: SPEP, UPEP,  kappa and lambda light chains    Lab Results  Component Value Date   WBC 7.2 10/29/2013   NEUTROABS 4.2 10/29/2013   HGB 14.2 10/29/2013   HCT 42.7 10/29/2013   MCV 90.7 10/29/2013   PLT 276 10/29/2013      Chemistry      Component Value Date/Time   NA 139 10/29/2013  0825   NA 132* 05/07/2012 1315   K 4.7 10/29/2013 0825   K 3.8 05/07/2012 1315   CL 106 12/25/2012 0924   CL 100 05/07/2012 1315   CO2 25 10/29/2013 0825   CO2 19 05/07/2012 1315   BUN 17.8 10/29/2013 0825   BUN 15 05/07/2012 1315   CREATININE 0.8 10/29/2013 0825   CREATININE 0.61 05/07/2012 1315      Component Value Date/Time   CALCIUM 9.4 10/29/2013 0825   CALCIUM 8.4 05/07/2012 1315   ALKPHOS 108 10/29/2013 0825   ALKPHOS 97 05/07/2012 1315   AST 12 10/29/2013 0825   AST 41* 05/07/2012 1315   ALT 12 10/29/2013 0825   ALT 18 05/07/2012 1315   BILITOT 0.26 10/29/2013 0825   BILITOT 0.4 05/07/2012 1315     RADIOGRAPHIC STUDIES: A review imaging study with patient and her husband. That is no evidence of recurrence of disease. She has stable pulmonary nodules. I have personally reviewed the radiological images as listed and agreed  with the findings in the report.  ASSESSMENT & PLAN:  #1 MGUS There is no evidence of end organ damage. I recommend we restage her again with skeletal survey and blood work to rule out progression of MGUS. I will order that for next year. #2 stage II colon cancer T3, N0, M0 The patient has completed adjuvant treatment. CEA is not elevated. She had a colonoscopy in July 2014. We will repeat it again in 2019. I will see her back with history, physical examination, blood work and imaging study in 1 year #3 lung nodule This has been stable. I will order a chest x-ray with her next visit and plan on ordering CT scan of the chest in 2 years. #4 liver lesion, consistent with hepatic cyst. This is too small to characterize. There is no change. #5 fibromyalgia I recommend vitamin D supplementation #6 history of hot flashes The patient has been on hormone replacement therapy for long time. I suggest the patient to consider taper due to risk of breast cancer #7 thyroid nodule This has been stable. We will observe.   Orders Placed This Encounter  Procedures  . DG Bone Survey  Met    Standing Status: Future     Number of Occurrences:      Standing Expiration Date: 02/05/2015    Order Specific Question:  Reason for Exam (SYMPTOM  OR DIAGNOSIS REQUIRED)    Answer:  staging myeloma    Order Specific Question:  Preferred imaging location?    Answer:  [facility]  . CT Abdomen Pelvis W Contrast    Standing Status: Future     Number of Occurrences:      Standing Expiration Date: 02/05/2015    Order Specific Question:  Reason for Exam (SYMPTOM  OR DIAGNOSIS REQUIRED)    Answer:  resected colon cancer    Order Specific Question:  Preferred imaging location?    Answer:  [facility]  . DG Chest 2 View    Standing Status: Future     Number of Occurrences:      Standing Expiration Date: 02/05/2015    Order Specific Question:  Reason for exam:    Answer:  lung nodule, colon cancer    Order Specific Question:  Preferred imaging location?    Answer:  [facility]  . CBC with Differential    Standing Status: Future     Number of Occurrences:      Standing Expiration Date: 02/05/2015  . Comprehensive metabolic panel    Standing Status: Future     Number of Occurrences:      Standing Expiration Date: 02/05/2015  . SPEP & IFE with QIG    Standing Status: Future     Number of Occurrences:      Standing Expiration Date: 02/05/2015  . Kappa/lambda light chains    Standing Status: Future     Number of Occurrences:      Standing Expiration Date: 02/05/2015  . Beta 2 microglobulin, serum    Standing Status: Future     Number of Occurrences:      Standing Expiration Date: 02/05/2015  . CEA    Standing Status: Future     Number of Occurrences:      Standing Expiration Date: 02/05/2015   All questions were answered. The patient knows to call the clinic with any problems, questions or concerns. No barriers to learning was detected. I spent 40 minutes counseling the patient face to face. The total time  spent in the appointment was 55 minutes and more than 50%  was on counseling and review of test results     Heath Lark, MD 11/05/2013 10:52 AM

## 2013-11-10 ENCOUNTER — Telehealth: Payer: Self-pay | Admitting: Hematology and Oncology

## 2013-11-10 NOTE — Telephone Encounter (Signed)
lmonvm advising the pt of her may 2016 appts °

## 2014-02-02 ENCOUNTER — Other Ambulatory Visit: Payer: Self-pay | Admitting: Neurology

## 2014-02-03 ENCOUNTER — Other Ambulatory Visit: Payer: Self-pay | Admitting: Neurology

## 2014-02-05 ENCOUNTER — Other Ambulatory Visit: Payer: Self-pay | Admitting: Neurology

## 2014-02-05 NOTE — Telephone Encounter (Signed)
Patient requesting refill of Tegretol, please call patient and advise, states that she is about to run out.

## 2014-02-05 NOTE — Telephone Encounter (Signed)
I spoke with the patient who will call us back to schedule appt.

## 2014-02-06 NOTE — Telephone Encounter (Signed)
Patient called and scheduled an appointment with [facility] on 02/19/14, states that she would like enough medication to last her until that appointment.

## 2014-02-19 ENCOUNTER — Encounter: Payer: Self-pay | Admitting: Adult Health

## 2014-02-19 ENCOUNTER — Ambulatory Visit (INDEPENDENT_AMBULATORY_CARE_PROVIDER_SITE_OTHER): Payer: BC Managed Care – PPO | Admitting: Adult Health

## 2014-02-19 VITALS — BP 116/76 | HR 74 | Ht 68.0 in | Wt 195.0 lb

## 2014-02-19 DIAGNOSIS — Z5181 Encounter for therapeutic drug level monitoring: Secondary | ICD-10-CM

## 2014-02-19 DIAGNOSIS — R569 Unspecified convulsions: Secondary | ICD-10-CM

## 2014-02-19 MED ORDER — CARBAMAZEPINE ER 100 MG PO TB12: ORAL_TABLET | ORAL | Status: DC

## 2014-02-19 MED ORDER — ETHOSUXIMIDE 250 MG PO CAPS: ORAL_CAPSULE | ORAL | Status: DC

## 2014-02-19 NOTE — Progress Notes (Signed)
I have read the note, and I agree with the clinical assessment and plan.  Shonta Phillis KEITH   

## 2014-02-19 NOTE — Patient Instructions (Signed)
Seizure, Adult A seizure means there is unusual activity in the brain. A seizure can cause changes in attention or behavior. Seizures often cause shaking (convulsions). Seizures often last from 30 seconds to 2 minutes. HOME CARE   If you are given medicines, take them exactly as told by your doctor.  Keep all doctor visits as told.  Do not swim or drive until your doctor says it is okay.  Teach others what to do if you have a seizure. They should:  Lay you on the ground.  Put a cushion under your head.  Loosen any tight clothing around your neck.  Turn you on your side.  Stay with you until you get better. GET HELP RIGHT AWAY IF:   The seizure lasts longer than 2 to 5 minutes.  The seizure is very bad.  The person does not wake up after the seizure.  The person's attention or behavior changes. Drive the person to the emergency room or call your local emergency services (911 in [place] MAKE SURE YOU:   Understand these instructions.  Will watch your condition.  Will get help right away if you are not doing well or get worse. Document Released: 12/06/2007 Document Revised: 09/11/2011 Document Reviewed: 06/07/2011 Exit[employer] Patient Information 2015 [employer]. This information is not intended to replace advice given to you by your health care provider. Make sure you discuss any questions you have with your health care provider.  

## 2014-02-19 NOTE — Progress Notes (Signed)
PATIENT: ZONDRA LAWLOR DOB: Nov 08, 1950  REASON FOR VISIT: follow up HISTORY FROM: patient  HISTORY OF PRESENT ILLNESS: Ms. Aydelotte is a 63 year old female with a history of seizures. She returns today for follow-up. She is currently taking carbamazepine and zarontin and is tolerating them well. She denies any recent seizures. States that her last seizure was when she was 63 years old. Operating a motor a vehicle without difficulty. Denies any new neurological complaints. She states that since her last visit she had colon cancer but is currently in remission after surgery and chemotherapy.   REVIEW OF SYSTEMS: Full 14 system review of systems performed and notable only for:  Constitutional: N/A  Eyes: N/A Ear/Nose/Throat: N/A  Skin: N/A  Cardiovascular: N/A  Respiratory: N/A  Gastrointestinal: N/A  Genitourinary: N/A Hematology/Lymphatic: N/A  Endocrine: N/A Musculoskeletal: Aching muscles Allergy/Immunology: Environmental allergies Neurological: Seizures Psychiatric: N/A Sleep: N/A   ALLERGIES: Allergies  Allergen Reactions  . Aspirin Other (See Comments)    GI upset  . Erythromycin     Gi upset  . Imdur [Isosorbide Mononitrate]     Pt does not recall reaction  . Lisinopril     cough  . Niacin And Related     Pt does not recall reactions   . Penicillins     Gi upset  . Statins     Muscle pain  . Sulfa Drugs Cross Reactors Swelling  . Tetracyclines & Related     HOME MEDICATIONS: Outpatient Prescriptions Prior to Visit  Medication Sig Dispense Refill  . diphenhydrAMINE (BENADRYL) 25 MG tablet Take 25 mg by mouth every 6 (six) hours as needed. For allergies.      . DULoxetine (CYMBALTA) 60 MG capsule Take 60 mg by mouth every morning.      Marland Kitchen EFFIENT 10 MG TABS TAKE 1 TABLET (10 MG TOTAL) BY MOUTH DAILY.  30 tablet  1  . estradiol (VIVELLE-DOT) 0.075 MG/24HR Place 1 patch onto the skin 2 (two) times a week. On Sundays and Wednesdays.      Marland Kitchen levothyroxine  (SYNTHROID, LEVOTHROID) 50 MCG tablet Take 50 mcg by mouth daily.       . nitroGLYCERIN (NITROSTAT) 0.4 MG SL tablet Place 0.4 mg under the tongue every 5 (five) minutes as needed. For chest pain.      . TEGRETOL-XR 100 MG 12 hr tablet TAKE 5 TABLETS BY MOUTH TWICE A DAY  300 tablet  0  . ethosuximide (ZARONTIN) 250 MG capsule TAKE AS INSTRUCTED BY YOUR PRESCRIBER  540 capsule  0  . ezetimibe (ZETIA) 10 MG tablet Take 10 mg by mouth at bedtime.      . pantoprazole (PROTONIX) 40 MG tablet Take 1 tablet (40 mg total) by mouth every morning.  30 tablet  0   No facility-administered medications prior to visit.    PAST MEDICAL HISTORY: Past Medical History  Diagnosis Date  . Coronary artery disease     Has had remote NSTEMI with PCI to the RCA; S/P CABG in 2007; LAST CATH WITH MODERATE DISEASE IN A VERY SMALL DIAGONAL BRANCH; SHE IS MANAGED MEDICALLY  . Dyslipidemia   . Seizure disorder   . History of fibromyalgia   . Chest pain   . Monoclonal gammopathy of unknown significance   . Colon cancer 12/04/2011    s/p Laparoscopic-assisted transverse colectomy on 12/19/2011 by Dr. Donne Hazel.  pT3 N0 M0.   Marland Kitchen Hyperlipidemia   . Myocardial infarction 2007  . PONV (postoperative  nausea and vomiting)   . Bronchitis     hx of;>24yrs  . Headache(784.0)   . Fibromyalgia   . Arthritis     hands   . GERD (gastroesophageal reflux disease)     takes Pantoprazole daily  . Gastric ulcer   . Iron deficiency anemia 11/17/2011  . Hypothyroid     takes Levothyroxine daily  . Depression     takes Cymbalta daily  . Diarrhea 05/10/2012  . Diarrhea 05/10/2012  . Wears glasses   . Seizures 12/06/11    last seizure over 40 yrs ago;takes Tegretol     PAST SURGICAL HISTORY: Past Surgical History  Procedure Laterality Date  . Partial hysterectomy  20+yrs ago  . Posterior laminectomy / decompression cervical spine      20+yrs ago  . Cardiac catheterization  05/20/2009    obstructive native vessel disease in  LAD, RCA, and first diagonal, patent vein graft to distal RCA and LIMA to LAD,normal. ef 60%  . Colonoscopy    . Coronary artery bypass graft  2007    x 2  . Coronary angioplasty      1 stent  . Colon resection  12/19/2011    Procedure: COLON RESECTION LAPAROSCOPIC;  Surgeon: Rolm Bookbinder, MD;  Location: [facility];  Service: General;  Laterality: N/A;  laparoscopic hand assisted partial colon resection  . Portacath placement  06/04/2012    Procedure: INSERTION PORT-A-CATH;  Surgeon: Rolm Bookbinder, MD;  Location: [facility];  Service: General;  Laterality: N/A;  Insertion of port-a-cath   . Port-a-cath removal N/A 06/12/2013    Procedure: REMOVAL PORT-A-CATH;  Surgeon: Rolm Bookbinder, MD;  Location: [facility];  Service: General;  Laterality: N/A;    FAMILY HISTORY: Family History  Problem Relation Age of Onset  . Heart attack Father   . Heart disease Father   . Heart attack Brother 59  . Cancer Mother     lung    SOCIAL HISTORY: History   Social History  . Marital Status: Married    Spouse Name: N/A    Number of Children: 2  . Years of Education: 12   Occupational History  . office work     unemployed   Social History Main Topics  . Smoking status: Former Smoker -- 0.30 packs/day for 20 years    Types: Cigarettes    Quit date: 06/06/1981  . Smokeless tobacco: Never Used     Comment: quit over 10yrs ago  . Alcohol Use: Yes     Comment: occ wine  . Drug Use: No  . Sexual Activity: Yes    Birth Control/ Protection: Surgical   Other Topics Concern  . Not on file   Social History Narrative   Patient is married with 2 children.   Patient is right handed.   Patient has a high school education with some college education.   Patient drinks 2 cups daily.      PHYSICAL EXAM  Filed Vitals:   02/19/14 1023  Height: 5\' 8"  (1.727 m)  Weight: 195 lb (88.451 kg)   Body mass index is 29.66 kg/(m^2).  Generalized: Well developed, in no acute distress    Neurological examination  Mentation: Alert oriented to time, place, history taking. Follows all commands speech and language fluent Cranial nerve II-XII: Pupils were equal round reactive to light. Extraocular movements were full, visual field were full on confrontational test. Facial sensation and strength were normal. hearing was intact to finger rubbing bilaterally. Uvula  tongue midline. Head turning and shoulder shrug  were normal and symmetric. Motor: The motor testing reveals 5 over 5 strength of all 4 extremities. Good symmetric motor tone is noted throughout.  Sensory: Sensory testing is intact to soft touch on all 4 extremities. No evidence of extinction is noted.  Coordination: Cerebellar testing reveals good finger-nose-finger and heel-to-shin bilaterally.  Gait and station: Gait is normal. Tandem gait is normal. Romberg is negative. No drift is seen.  Reflexes: Deep tendon reflexes are symmetric and normal bilaterally.     DIAGNOSTIC DATA (LABS, IMAGING, TESTING) - I reviewed patient records, labs, notes, testing and imaging myself where available.  Lab Results  Component Value Date   WBC 7.2 10/29/2013   HGB 14.2 10/29/2013   HCT 42.7 10/29/2013   MCV 90.7 10/29/2013   PLT 276 10/29/2013      Component Value Date/Time   NA 139 10/29/2013 0825   NA 132* 05/07/2012 1315   K 4.7 10/29/2013 0825   K 3.8 05/07/2012 1315   CL 106 12/25/2012 0924   CL 100 05/07/2012 1315   CO2 25 10/29/2013 0825   CO2 19 05/07/2012 1315   GLUCOSE 98 10/29/2013 0825   GLUCOSE 105* 12/25/2012 0924   GLUCOSE 115* 05/07/2012 1315   BUN 17.8 10/29/2013 0825   BUN 15 05/07/2012 1315   CREATININE 0.8 10/29/2013 0825   CREATININE 0.61 05/07/2012 1315   CALCIUM 9.4 10/29/2013 0825   CALCIUM 8.4 05/07/2012 1315   PROT 7.7 10/29/2013 0825   PROT 6.5 05/07/2012 1315   ALBUMIN 3.8 10/29/2013 0825   ALBUMIN 3.4* 05/07/2012 1315   AST 12 10/29/2013 0825   AST 41* 05/07/2012 1315   ALT 12 10/29/2013 0825   ALT 18  05/07/2012 1315   ALKPHOS 108 10/29/2013 0825   ALKPHOS 97 05/07/2012 1315   BILITOT 0.26 10/29/2013 0825   BILITOT 0.4 05/07/2012 1315   GFRNONAA >90 05/07/2012 1315   GFRAA >90 05/07/2012 1315   Lab Results  Component Value Date   CHOL 268* 08/17/2011   HDL 82 08/17/2011   LDLCALC 165* 08/17/2011   TRIG 107 08/17/2011   CHOLHDL 3.3 08/17/2011   Lab Results  Component Value Date   HGBA1C 5.7* 08/16/2011   Lab Results  Component Value Date   VITAMINB12 241 11/17/2011   Lab Results  Component Value Date   TSH 1.143 08/16/2011      ASSESSMENT AND PLAN 63 y.o. year old female  has a past medical history of Coronary artery disease; Dyslipidemia; Seizure disorder; History of fibromyalgia; Chest pain; Monoclonal gammopathy of unknown significance; Colon cancer (12/04/2011); Hyperlipidemia; Myocardial infarction (2007); PONV (postoperative nausea and vomiting); Bronchitis; Headache(784.0); Fibromyalgia; Arthritis; GERD (gastroesophageal reflux disease); Gastric ulcer; Iron deficiency anemia (11/17/2011); Hypothyroid; Depression; Diarrhea (05/10/2012); Diarrhea (05/10/2012); Wears glasses; and Seizures (12/06/11). here with:  1. Seizures  Seizures have been well controlled I will refills carbamazepine and zarontin Check blood work today Patient should follow-up in 1 year or sooner if needed.   Ward Givens, MSN, NP-C 02/19/2014, 10:28 AM [facility] [address] [place], [place] [address] [phone]  Note: This document was prepared with digital dictation and possible smart phrase technology. Any transcriptional errors that result from this process are unintentional.

## 2014-02-20 LAB — COMPREHENSIVE METABOLIC PANEL
ALK PHOS: 110 IU/L (ref 39–117)
ALT: 11 IU/L (ref 0–32)
AST: 10 IU/L (ref 0–40)
Albumin/Globulin Ratio: 1.5 (ref 1.1–2.5)
Albumin: 4.4 g/dL (ref 3.6–4.8)
BUN / CREAT RATIO: 27 — AB (ref 11–26)
BUN: 17 mg/dL (ref 8–27)
CALCIUM: 9.2 mg/dL (ref 8.7–10.3)
CO2: 23 mmol/L (ref 18–29)
CREATININE: 0.64 mg/dL (ref 0.57–1.00)
Chloride: 97 mmol/L (ref 97–108)
GFR calc Af Amer: 109 mL/min/{1.73_m2} (ref >59)
GFR calc non Af Amer: 95 mL/min/{1.73_m2} (ref >59)
GLOBULIN, TOTAL: 3 g/dL (ref 1.5–4.5)
Glucose: 93 mg/dL (ref 65–99)
Potassium: 5 mmol/L (ref 3.5–5.2)
Sodium: 136 mmol/L (ref 134–144)
Total Bilirubin: <0.2 mg/dL (ref 0.0–1.2)
Total Protein: 7.4 g/dL (ref 6.0–8.5)

## 2014-02-20 LAB — CBC WITH DIFFERENTIAL
Basophils Absolute: 0 10*3/uL (ref 0.0–0.2)
Basos: 0 %
EOS ABS: 0.4 10*3/uL (ref 0.0–0.4)
EOS: 4 %
HCT: 40.9 % (ref 34.0–46.6)
Hemoglobin: 14 g/dL (ref 11.1–15.9)
IMMATURE GRANULOCYTES: 0 %
Immature Grans (Abs): 0 10*3/uL (ref 0.0–0.1)
Lymphocytes Absolute: 2.6 10*3/uL (ref 0.7–3.1)
Lymphs: 30 %
MCH: 30.5 pg (ref 26.6–33.0)
MCHC: 34.2 g/dL (ref 31.5–35.7)
MCV: 89 fL (ref 79–97)
MONOS ABS: 0.7 10*3/uL (ref 0.1–0.9)
Monocytes: 8 %
Neutrophils Absolute: 5.1 10*3/uL (ref 1.4–7.0)
Neutrophils Relative %: 58 %
PLATELETS: 333 10*3/uL (ref 150–379)
RBC: 4.59 x10E6/uL (ref 3.77–5.28)
RDW: 13.5 % (ref 12.3–15.4)
WBC: 8.8 10*3/uL (ref 3.4–10.8)

## 2014-02-20 LAB — CARBAMAZEPINE LEVEL, TOTAL: Carbamazepine Lvl: 7.5 ug/mL (ref 4.0–12.0)

## 2014-02-20 NOTE — Progress Notes (Signed)
Quick Note:  Spoke with patient's husband and informed him of normal blood work, and to call back with any questions or concerns. ______

## 2014-03-06 ENCOUNTER — Other Ambulatory Visit: Payer: Self-pay | Admitting: Neurology

## 2014-05-20 ENCOUNTER — Encounter: Payer: Self-pay | Admitting: Neurology

## 2014-05-26 ENCOUNTER — Encounter: Payer: Self-pay | Admitting: Neurology

## 2014-06-09 ENCOUNTER — Other Ambulatory Visit: Payer: Self-pay

## 2014-06-09 MED ORDER — NITROGLYCERIN 0.4 MG SL SUBL: 0.4000 mg | SUBLINGUAL_TABLET | SUBLINGUAL | Status: DC | PRN

## 2014-06-12 ENCOUNTER — Other Ambulatory Visit: Payer: Self-pay | Admitting: *Deleted

## 2014-06-12 ENCOUNTER — Encounter: Payer: Self-pay | Admitting: Nurse Practitioner

## 2014-06-12 ENCOUNTER — Telehealth: Payer: Self-pay | Admitting: Nurse Practitioner

## 2014-06-12 ENCOUNTER — Encounter: Payer: Self-pay | Admitting: *Deleted

## 2014-06-12 ENCOUNTER — Ambulatory Visit (HOSPITAL_BASED_OUTPATIENT_CLINIC_OR_DEPARTMENT_OTHER): Payer: BC Managed Care – PPO | Admitting: Nurse Practitioner

## 2014-06-12 ENCOUNTER — Ambulatory Visit (HOSPITAL_BASED_OUTPATIENT_CLINIC_OR_DEPARTMENT_OTHER): Payer: BC Managed Care – PPO

## 2014-06-12 ENCOUNTER — Telehealth: Payer: Self-pay | Admitting: *Deleted

## 2014-06-12 VITALS — BP 138/66 | HR 91 | Temp 98.7°F | Resp 18 | Ht 68.0 in | Wt 192.7 lb

## 2014-06-12 DIAGNOSIS — C189 Malignant neoplasm of colon, unspecified: Secondary | ICD-10-CM

## 2014-06-12 DIAGNOSIS — C184 Malignant neoplasm of transverse colon: Secondary | ICD-10-CM

## 2014-06-12 DIAGNOSIS — R5383 Other fatigue: Secondary | ICD-10-CM | POA: Insufficient documentation

## 2014-06-12 DIAGNOSIS — D649 Anemia, unspecified: Secondary | ICD-10-CM

## 2014-06-12 DIAGNOSIS — D472 Monoclonal gammopathy: Secondary | ICD-10-CM

## 2014-06-12 DIAGNOSIS — R109 Unspecified abdominal pain: Secondary | ICD-10-CM | POA: Insufficient documentation

## 2014-06-12 DIAGNOSIS — R569 Unspecified convulsions: Secondary | ICD-10-CM

## 2014-06-12 DIAGNOSIS — R42 Dizziness and giddiness: Secondary | ICD-10-CM | POA: Insufficient documentation

## 2014-06-12 DIAGNOSIS — R5382 Chronic fatigue, unspecified: Secondary | ICD-10-CM

## 2014-06-12 DIAGNOSIS — R197 Diarrhea, unspecified: Secondary | ICD-10-CM

## 2014-06-12 DIAGNOSIS — R5381 Other malaise: Secondary | ICD-10-CM

## 2014-06-12 DIAGNOSIS — E876 Hypokalemia: Secondary | ICD-10-CM

## 2014-06-12 DIAGNOSIS — R1030 Lower abdominal pain, unspecified: Secondary | ICD-10-CM

## 2014-06-12 DIAGNOSIS — R350 Frequency of micturition: Secondary | ICD-10-CM

## 2014-06-12 LAB — COMPREHENSIVE METABOLIC PANEL (CC13)
ALK PHOS: 104 U/L (ref 40–150)
ALT: 18 U/L (ref 0–55)
AST: 14 U/L (ref 5–34)
Albumin: 3.9 g/dL (ref 3.5–5.0)
Anion Gap: 11 mEq/L (ref 3–11)
BILIRUBIN TOTAL: 0.25 mg/dL (ref 0.20–1.20)
BUN: 15.9 mg/dL (ref 7.0–26.0)
CO2: 25 mEq/L (ref 22–29)
Calcium: 9.1 mg/dL (ref 8.4–10.4)
Chloride: 100 mEq/L (ref 98–109)
Creatinine: 0.8 mg/dL (ref 0.6–1.1)
EGFR: 80 mL/min/{1.73_m2} — ABNORMAL LOW (ref >90)
Glucose: 115 mg/dl (ref 70–140)
POTASSIUM: 4.2 meq/L (ref 3.5–5.1)
SODIUM: 136 meq/L (ref 136–145)
TOTAL PROTEIN: 7.7 g/dL (ref 6.4–8.3)

## 2014-06-12 LAB — CBC WITH DIFFERENTIAL/PLATELET
BASO%: 0.2 % (ref 0.0–2.0)
Basophils Absolute: 0 10*3/uL (ref 0.0–0.1)
EOS%: 3.2 % (ref 0.0–7.0)
Eosinophils Absolute: 0.3 10*3/uL (ref 0.0–0.5)
HCT: 42.3 % (ref 34.8–46.6)
HGB: 14.2 g/dL (ref 11.6–15.9)
LYMPH%: 31.7 % (ref 14.0–49.7)
MCH: 29.6 pg (ref 25.1–34.0)
MCHC: 33.6 g/dL (ref 31.5–36.0)
MCV: 88.1 fL (ref 79.5–101.0)
MONO#: 0.6 10*3/uL (ref 0.1–0.9)
MONO%: 7.8 % (ref 0.0–14.0)
NEUT#: 4.7 10*3/uL (ref 1.5–6.5)
NEUT%: 57.1 % (ref 38.4–76.8)
Platelets: 304 10*3/uL (ref 145–400)
RBC: 4.8 10*6/uL (ref 3.70–5.45)
RDW: 12.8 % (ref 11.2–14.5)
WBC: 8.2 10*3/uL (ref 3.9–10.3)
lymph#: 2.6 10*3/uL (ref 0.9–3.3)

## 2014-06-12 LAB — URINALYSIS, MICROSCOPIC - CHCC
BLOOD: NEGATIVE
Bilirubin (Urine): NEGATIVE
GLUCOSE UR CHCC: NEGATIVE mg/dL
Ketones: NEGATIVE mg/dL
LEUKOCYTE ESTERASE: NEGATIVE
Nitrite: NEGATIVE
Protein: NEGATIVE mg/dL
RBC / HPF: NEGATIVE (ref 0–2)
Specific Gravity, Urine: 1.02 (ref 1.003–1.035)
Urobilinogen, UR: 0.2 mg/dL (ref 0.2–1)
pH: 6 (ref 4.6–8.0)

## 2014-06-12 LAB — AMYLASE: AMYLASE: 58 U/L (ref 0–105)

## 2014-06-12 LAB — LIPASE: Lipase: 22 U/L (ref 11–59)

## 2014-06-12 NOTE — Telephone Encounter (Signed)
Per 12/11 POF, NP/CB added for today sent pt to registration and called Monica to confirm..... KJ

## 2014-06-12 NOTE — Assessment & Plan Note (Signed)
Patient completed her chemotherapy in March 2014.  She obtained a normal colonoscopy in July 2014.  Restaging CT in April 2015 showed no reoccurrence of her colon cancer.  May very well require a repeat CT for further eval if symptoms continue or worsen.

## 2014-06-12 NOTE — Telephone Encounter (Signed)
PT. HAS HAD A "GNAWING" INTERMITTED PAIN ON A SCALE OF TWO FOR A FEW DAYS. HER STOOLS ARE FORMED BUT THE PAST TWO WEEKS HAVE BEEN "THINNER" THAN NORMAL. NO BLOOD. THIS WEEK PT. HAS BEEN VERY FATIGUED AND LIGHT-HEADED. PT. HAD CALLED SCHEDULING AND THE CALL WAS TRANSFERRED TO TRIAGE. THIS NOTE WAS ROUTED TO [facility] AND SPOKE TO DR.GORSUCH'S NURSE, TAMMI HOLLAND,RN.

## 2014-06-12 NOTE — Assessment & Plan Note (Signed)
Patient has been complaining of some slight increased urinary frequency.  Urinalysis obtained today was negative for UTI.  Pending urine culture results.  Patient had no flank pain on exam.

## 2014-06-12 NOTE — Progress Notes (Signed)
will   SYMPTOM MANAGEMENT CLINIC   HPI: Rachel Clark 63 y.o. female diagnosed with colon cancer and MGUS.  Patient is status post chemotherapy completed in March 2014.  Patient completed 5-FU chemotherapy in March 2014.  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  Patient has an extensive past medical history which includes fibromyalgia, chronic fatigue, hypothyroidism, and a seizure history.  She remains on 2 different seizure medications; even though she has been seizure-free for proximally 40 years.  Patient is complaining of 1-2 days history of increased fatigue, occasional lightheadedness, occasional urinary frequency, and some vague, mild abdominal pain.  She denies any nausea, and vomiting, diarrhea, or constipation.  She denies any recent fevers or chills.   HPI  ROS  Past Medical History  Diagnosis Date  . Coronary artery disease     Has had remote NSTEMI with PCI to the RCA; S/P CABG in 2007; LAST CATH WITH MODERATE DISEASE IN A VERY SMALL DIAGONAL BRANCH; SHE IS MANAGED MEDICALLY  . Dyslipidemia   . Seizure disorder   . History of fibromyalgia   . Chest pain   . Monoclonal gammopathy of unknown significance   . Colon cancer 12/04/2011    s/p Laparoscopic-assisted transverse colectomy on 12/19/2011 by Dr. Donne Hazel.  pT3 N0 M0.   Marland Kitchen Hyperlipidemia   . Myocardial infarction 2007  . PONV (postoperative nausea and vomiting)   . Bronchitis     hx of;>73yr  . Headache(784.0)   . Fibromyalgia   . Arthritis     hands   . GERD (gastroesophageal reflux disease)     takes Pantoprazole daily  . Gastric ulcer   . Iron deficiency anemia 11/17/2011  . Hypothyroid     takes Levothyroxine daily  . Depression     takes Cymbalta daily  . Diarrhea 05/10/2012  . Diarrhea 05/10/2012  . Wears glasses   . Seizures 12/06/11    last seizure over 40 yrs ago;takes Tegretol     Past Surgical History  Procedure Laterality Date    . Partial hysterectomy  20+yrs ago  . Posterior laminectomy / decompression cervical spine      20+yrs ago  . Cardiac catheterization  05/20/2009    obstructive native vessel disease in LAD, RCA, and first diagonal, patent vein graft to distal RCA and LIMA to LAD,normal. ef 60%  . Colonoscopy    . Coronary artery bypass graft  2007    x 2  . Coronary angioplasty      1 stent  . Colon resection  12/19/2011    Procedure: COLON RESECTION LAPAROSCOPIC;  Surgeon: MRolm Bookbinder MD;  Location: [facility]  Service: General;  Laterality: N/A;  laparoscopic hand assisted partial colon resection  . Portacath placement  06/04/2012    Procedure: INSERTION PORT-A-CATH;  Surgeon: MRolm Bookbinder MD;  Location[place] [facility]  Service: General;  Laterality: N/A;  Insertion of port-a-cath   . Port-a-cath removal N/A 06/12/2013    Procedure: REMOVAL PORT-A-CATH;  Surgeon: MRolm Bookbinder MD;  Location: [facility]  Service: General;  Laterality: N/A;    has Coronary artery disease; Dyslipidemia; Monoclonal gammopathy of unknown significance; Hypothyroid; Chest pain; GERD (gastroesophageal reflux disease); Anemia; Iron deficiency anemia; Colon cancer; Diarrhea; Hypokalemia; Lung nodule; Thyroid nodule; Seizures; Fatigue; Lightheadedness; Abdominal pain; and Urinary frequency on her problem list.     is allergic to aspirin; erythromycin; imdur; lisinopril; niacin and related; penicillins; statins; sulfa drugs  cross reactors; and tetracyclines & related.    Medication List       This list is accurate as of: 06/12/14  7:04 PM.  Always use your most recent med list.               carbamazepine 100 MG 12 hr tablet  Commonly known as:  TEGRETOL-XR  TAKE 5 TABLETS BY MOUTH TWICE A DAY     TEGRETOL-XR 100 MG 12 hr tablet  Generic drug:  carbamazepine  TAKE 5 TABLETS BY MOUTH TWICE A DAY     diphenhydrAMINE 25 MG tablet  Commonly known as:  BENADRYL  Take 25 mg by mouth every 6 (six)  hours as needed. For allergies.     DULoxetine 60 MG capsule  Commonly known as:  CYMBALTA  Take 60 mg by mouth every morning.     EFFIENT 10 MG Tabs tablet  Generic drug:  prasugrel  TAKE 1 TABLET (10 MG TOTAL) BY MOUTH DAILY.     estradiol 0.075 MG/24HR  Commonly known as:  VIVELLE-DOT  Place 1 patch onto the skin 2 (two) times a week. On Sundays and Wednesdays.     ethosuximide 250 MG capsule  Commonly known as:  ZARONTIN  Taking 1 in am, 2 in the afternoon, and 2 at night     levothyroxine 50 MCG tablet  Commonly known as:  SYNTHROID, LEVOTHROID  Take 50 mcg by mouth daily.     nitroGLYCERIN 0.4 MG SL tablet  Commonly known as:  NITROSTAT  Place 1 tablet (0.4 mg total) under the tongue every 5 (five) minutes as needed. For chest pain.         PHYSICAL EXAMINATION  Blood pressure 138/66, pulse 91, temperature 98.7 F (37.1 C), temperature source Oral, resp. rate 18, height _0  (1.727 m), weight 192 lb 11.2 oz (87.408 kg).  Physical Exam  Constitutional: She is oriented to person, place, and time and well-developed, well-nourished, and in no distress.  HENT:  Head: Normocephalic and atraumatic.  Mouth/Throat: Oropharynx is clear and moist.  Eyes: Conjunctivae and EOM are normal. Pupils are equal, round, and reactive to light. Right eye exhibits no discharge. Left eye exhibits no discharge. No scleral icterus.  Neck: Normal range of motion. Neck supple. No JVD present. No tracheal deviation present. No thyromegaly present.  Cardiovascular: Normal rate, regular rhythm, normal heart sounds and intact distal pulses.   Pulmonary/Chest: Effort normal and breath sounds normal. No respiratory distress. She has no wheezes. She has no rales. She exhibits no tenderness.  Abdominal: Soft. Bowel sounds are normal. She exhibits no distension and no mass. There is tenderness. There is no rebound and no guarding.  Trace abdominal tenderness to lower abdomen area with deep palpation  only.  No flank pain on exam.  Musculoskeletal: Normal range of motion. She exhibits no edema or tenderness.  Lymphadenopathy:    She has no cervical adenopathy.  Neurological: She is alert and oriented to person, place, and time. Gait normal.  Skin: Skin is warm and dry. No rash noted. No erythema.  Psychiatric: Affect normal.  Nursing note and vitals reviewed.   LABORATORY DATA:. Appointment on 06/12/2014  Component Date Value Ref Range Status  . WBC 06/12/2014 8.2  3.9 - 10.3 10e3/uL Final  . NEUT# 06/12/2014 4.7  1.5 - 6.5 10e3/uL Final  . HGB 06/12/2014 14.2  11.6 - 15.9 g/dL Final  . HCT 06/12/2014 42.3  34.8 - 46.6 % Final  . Platelets 06/12/2014  304  145 - 400 10e3/uL Final  . MCV 06/12/2014 88.1  79.5 - 101.0 fL Final  . MCH 06/12/2014 29.6  25.1 - 34.0 pg Final  . MCHC 06/12/2014 33.6  31.5 - 36.0 g/dL Final  . RBC 06/12/2014 4.80  3.70 - 5.45 10e6/uL Final  . RDW 06/12/2014 12.8  11.2 - 14.5 % Final  . lymph# 06/12/2014 2.6  0.9 - 3.3 10e3/uL Final  . MONO# 06/12/2014 0.6  0.1 - 0.9 10e3/uL Final  . Eosinophils Absolute 06/12/2014 0.3  0.0 - 0.5 10e3/uL Final  . Basophils Absolute 06/12/2014 0.0  0.0 - 0.1 10e3/uL Final  . NEUT% 06/12/2014 57.1  38.4 - 76.8 % Final  . LYMPH% 06/12/2014 31.7  14.0 - 49.7 % Final  . MONO% 06/12/2014 7.8  0.0 - 14.0 % Final  . EOS% 06/12/2014 3.2  0.0 - 7.0 % Final  . BASO% 06/12/2014 0.2  0.0 - 2.0 % Final  . Sodium 06/12/2014 136  136 - 145 mEq/L Final  . Potassium 06/12/2014 4.2  3.5 - 5.1 mEq/L Final  . Chloride 06/12/2014 100  98 - 109 mEq/L Final  . CO2 06/12/2014 25  22 - 29 mEq/L Final  . Glucose 06/12/2014 115  70 - 140 mg/dl Final  . BUN 06/12/2014 15.9  7.0 - 26.0 mg/dL Final  . Creatinine 06/12/2014 0.8  0.6 - 1.1 mg/dL Final  . Total Bilirubin 06/12/2014 0.25  0.20 - 1.20 mg/dL Final  . Alkaline Phosphatase 06/12/2014 104  40 - 150 U/L Final  . AST 06/12/2014 14  5 - 34 U/L Final  . ALT 06/12/2014 18  0 - 55 U/L Final   . Total Protein 06/12/2014 7.7  6.4 - 8.3 g/dL Final  . Albumin 06/12/2014 3.9  3.5 - 5.0 g/dL Final  . Calcium 06/12/2014 9.1  8.4 - 10.4 mg/dL Final  . Anion Gap 06/12/2014 11  3 - 11 mEq/L Final  . EGFR 06/12/2014 80* >90 ml/min/1.73 m2 Final   eGFR is calculated using the CKD-EPI Creatinine Equation (2009)  . Lipase 06/12/2014 22  11 - 59 U/L Final  . Amylase 06/12/2014 58  0 - 105 U/L Final  . Glucose 06/12/2014 Negative  Negative mg/dL Final  . Bilirubin (Urine) 06/12/2014 Negative  Negative Final  . Ketones 06/12/2014 Negative  Negative mg/dL Final  . Specific Gravity, Urine 06/12/2014 1.020  1.003 - 1.035 Final  . Blood 06/12/2014 Negative  Negative Final  . pH 06/12/2014 6.0  4.6 - 8.0 Final  . Protein 06/12/2014 Negative  Negative- <30 mg/dL Final  . Urobilinogen, UR 06/12/2014 0.2  0.2 - 1 mg/dL Final  . Nitrite 06/12/2014 Negative  Negative Final  . Leukocyte Esterase 06/12/2014 Negative  Negative Final  . RBC / HPF 06/12/2014 Negative  0 - 2 Final  . WBC, UA 06/12/2014 0-2  0 - 2 Final  . Bacteria, UA 06/12/2014 Trace  Negative- Trace Final  . Epithelial Cells 06/12/2014 Few  Negative- Few Final     RADIOGRAPHIC STUDIES: No results found.  ASSESSMENT/PLAN:    Abdominal pain Patient is complaining of approximately a 24-36 hour history of abdominal pain.  She denies any nausea, vomiting, diarrhea, constipation.  She denies any blood in her stools.  On exam-patient was slightly tender in her lower abdominal area.  There was no rebound tenderness.  There was no flank pain.  Patient rated abdominal discomfort at a 2 on the pain scale.  Patient was in no acute distress.  Urinalysis obtained today was within normal limits.  Pending urine culture results.  Also pending CEA results at this time.  Patient may very well have a simple viral infection which will clear on its own.  Advised patient to call or go directly to the emergency department over the weekend if she develops any  worsening symptoms whatsoever.  May very well need further evaluation with a ultrasound or CT scan if symptoms continue.  Colon cancer Patient completed her chemotherapy in March 2014.  She obtained a normal colonoscopy in July 2014.  Restaging CT in April 2015 showed no reoccurrence of her colon cancer.  May very well require a repeat CT for further eval if symptoms continue or worsen.  Fatigue Patient has a history of chronic fatigue; but is complaining of increasing fatigue over the last week or so.  Labs obtained today revealed a stable blood count.  Patient may very well have a mild virus.  Patient was encouraged to remain as active as possible.    Lightheadedness Patient is also complaining of some mild, occasional lightheadedness.  Patient is not anemic per lab results today.  Patient does have a history of seizures in the past; but states that she has not had a seizure in approximate 40 years.  She does remain on 2 different seizure medications however.  Awaiting Tegretol result at this time.  Orthostatic vital signs were negative today.  Patient does not appear dehydrated today.  All electrolyte panel was within normal limits today.  Patient has no evidence of infection per labs or on exam.  Urinary frequency Patient has been complaining of some slight increased urinary frequency.  Urinalysis obtained today was negative for UTI.  Pending urine culture results.  Patient had no flank pain on exam.  Patient stated understanding of all instructions; and was in agreement with this plan of care. The patient knows to call the clinic with any problems, questions or concerns.   Review/collaboration with Dr. Alvy Bimler regarding all aspects of patient's visit today.   Total time spent with patient was 40 minutes;  with greater than 75 percent of that time spent in face to face counseling regarding her symptoms, and coordination of care and follow up.  Disclaimer: This note was dictated with voice  recognition software. Similar sounding words can inadvertently be transcribed and may not be corrected upon review.   Drue Second, NP 06/12/2014

## 2014-06-12 NOTE — Assessment & Plan Note (Addendum)
Patient is complaining of approximately a 24-36 hour history of abdominal pain.  She denies any nausea, vomiting, diarrhea, constipation.  She denies any blood in her stools.  On exam-patient was slightly tender in her lower abdominal area.  There was no rebound tenderness.  There was no flank pain.  Patient rated abdominal discomfort at a 2 on the pain scale.  Patient was in no acute distress.  Urinalysis obtained today was within normal limits.  Pending urine culture results.  Also pending CEA results at this time.  Patient may very well have a simple viral infection which will clear on its own.  Advised patient to call or go directly to the emergency department over the weekend if she develops any worsening symptoms whatsoever.  May very well need further evaluation with a ultrasound or CT scan if symptoms continue.

## 2014-06-12 NOTE — Telephone Encounter (Signed)
[place] INSTRUCTED TRIAGE TO HAVE CINDEE BACON,NP TO SEE PT.

## 2014-06-12 NOTE — Assessment & Plan Note (Signed)
Patient is also complaining of some mild, occasional lightheadedness.  Patient is not anemic per lab results today.  Patient does have a history of seizures in the past; but states that she has not had a seizure in approximate 40 years.  She does remain on 2 different seizure medications however.  Awaiting Tegretol result at this time.  Orthostatic vital signs were negative today.  Patient does not appear dehydrated today.  All electrolyte panel was within normal limits today.  Patient has no evidence of infection per labs or on exam.

## 2014-06-12 NOTE — Assessment & Plan Note (Signed)
Patient has a history of chronic fatigue; but is complaining of increasing fatigue over the last week or so.  Labs obtained today revealed a stable blood count.  Patient may very well have a mild virus.  Patient was encouraged to remain as active as possible.

## 2014-06-13 LAB — CEA: CEA: 1.3 ng/mL (ref 0.0–5.0)

## 2014-06-13 LAB — CARBAMAZEPINE LEVEL, TOTAL: CARBAMAZEPINE LVL: 9.1 ug/mL (ref 4.0–12.0)

## 2014-06-14 LAB — URINE CULTURE

## 2014-06-15 ENCOUNTER — Telehealth: Payer: Self-pay | Admitting: Nurse Practitioner

## 2014-06-15 NOTE — Telephone Encounter (Signed)
Called patient to review CEA and Tegretol levels.  All within normal limits. Pt states that she still has episodes of mild lightheadedness; but otherwise- feels fine. Denies any other symptoms whatsoever.    Advised pt to follow up with her PCP; and to let us know if she develops any further issues.

## 2014-06-17 NOTE — Progress Notes (Signed)
Cardiology Office Note   Date:  06/18/2014   ID:  IYANNI HEPP, DOB 03-12-1951, MRN [id number]  PCP:  Vidal Schwalbe, MD  Cardiologist:  Dr. Peter [name]     History of Present Illness: Rachel Clark is a 62 y.o. female with a hx of CAD s/p CABG in 2007, ASA intolerance due to GI upset, HL with statin intolerance, colon CA s/p resection and chemotherapy, MGUS.  She was last seen by Dr. Peter [name] in 07/2011.  Admitted in 08/2011 with chest pain.  MI was ruled out and Myoview was negative for ischemia.    She returns for FU.  She is doing well.  The patient denies chest pain, shortness of breath, syncope, orthopnea, PND or significant pedal edema.  She is no longer on Zetia for unclear reasons.     Studies:   - LHC (11/10):  2 v CAD with L-LAD and S-RCA patent, small caliber Dx 70% (treated medically - no amenable to PCI), CFX free of significant disease, normal LVF  - Nuclear (2/13):  No scar or ischemia, EF 59%   Recent Labs: 06/12/2014: ALT 18; BUN 15.9; Creatinine 0.8; Hemoglobin 14.2; Potassium 4.2; Sodium 136   Estimated Creatinine Clearance: 81.6 mL/min (by C-G formula based on Cr of 0.8).    Recent Radiology: No results found.    Wt Readings from Last 3 Encounters:  06/18/14 195 lb (88.451 kg)  06/12/14 192 lb 11.2 oz (87.408 kg)  02/19/14 195 lb (88.451 kg)     Past Medical History  Diagnosis Date  . Coronary artery disease     Has had remote NSTEMI with PCI to the RCA; S/P CABG in 2007; LAST CATH WITH MODERATE DISEASE IN A VERY SMALL DIAGONAL BRANCH; SHE IS MANAGED MEDICALLY  . Dyslipidemia   . Seizure disorder   . History of fibromyalgia   . Chest pain   . Monoclonal gammopathy of unknown significance   . Colon cancer 12/04/2011    s/p Laparoscopic-assisted transverse colectomy on 12/19/2011 by Dr. Donne Hazel.  pT3 N0 M0.   Marland Kitchen Hyperlipidemia   . Myocardial infarction 2007  . PONV (postoperative nausea and vomiting)   . Bronchitis     hx of;>20yrs   . Headache(784.0)   . Fibromyalgia   . Arthritis     hands   . GERD (gastroesophageal reflux disease)     takes Pantoprazole daily  . Gastric ulcer   . Iron deficiency anemia 11/17/2011  . Hypothyroid     takes Levothyroxine daily  . Depression     takes Cymbalta daily  . Diarrhea 05/10/2012  . Diarrhea 05/10/2012  . Wears glasses   . Seizures 12/06/11    last seizure over 40 yrs ago;takes Tegretol     Current Outpatient Prescriptions  Medication Sig Dispense Refill  . carbamazepine (TEGRETOL-XR) 100 MG 12 hr tablet TAKE 5 TABLETS BY MOUTH TWICE A DAY 300 tablet 5  . diphenhydrAMINE (BENADRYL) 25 MG tablet Take 25 mg by mouth every 6 (six) hours as needed. For allergies.    . DULoxetine (CYMBALTA) 60 MG capsule Take 60 mg by mouth every morning.    Marland Kitchen EFFIENT 10 MG TABS TAKE 1 TABLET (10 MG TOTAL) BY MOUTH DAILY. 30 tablet 1  . estradiol (VIVELLE-DOT) 0.075 MG/24HR Place 1 patch onto the skin 2 (two) times a week. On Sundays and Wednesdays.    Marland Kitchen ethosuximide (ZARONTIN) 250 MG capsule Taking 1 in am, 2 in the afternoon, and 2 at night  150 capsule 5  . levothyroxine (SYNTHROID, LEVOTHROID) 50 MCG tablet Take 50 mcg by mouth daily.     . nitroGLYCERIN (NITROSTAT) 0.4 MG SL tablet Place 1 tablet (0.4 mg total) under the tongue every 5 (five) minutes as needed. For chest pain. 25 tablet 1  . TEGRETOL-XR 100 MG 12 hr tablet TAKE 5 TABLETS BY MOUTH TWICE A DAY 300 tablet 11   No current facility-administered medications for this visit.     Allergies:   Aspirin; Erythromycin; Imdur; Lisinopril; Niacin and related; Penicillins; Statins; Sulfa drugs cross reactors; and Tetracyclines & related   Social History:  The patient  reports that she quit smoking about 33 years ago. Her smoking use included Cigarettes. She has a 6 pack-year smoking history. She has never used smokeless tobacco. She reports that she drinks alcohol. She reports that she does not use illicit drugs.   Family History:  The  patient's family history includes Cancer in her mother; Heart attack in her father; Heart attack (age of onset: 65) in her brother; Heart disease in her father. There is no history of Stroke.    ROS:  Please see the history of present illness.       All other systems reviewed and negative.    PHYSICAL EXAM: VS:  BP 130/80 mmHg  Pulse 80  Ht 5\' 8"  (1.727 m)  Wt 195 lb (88.451 kg)  BMI 29.66 kg/m2 Well nourished, well developed, in no acute distress HEENT: normal Neck: no JVD Carotids:  No bruits bilat Cardiac:  normal S1, S2;  RRR;  no murmur   Lungs:   clear to auscultation bilaterally, no wheezing, rhonchi or rales Abd: soft, nontender, no hepatomegaly Ext:  no edema Skin: warm and dry Neuro:  CNs 2-12 intact, no focal abnormalities noted  EKG:  NSR, HR 80, normal axis, nonspecific ST-T wave changes, no change from prior tracing      ASSESSMENT AND PLAN:   1.  CAD s/p CABG:  No angina.  She is on Effient for antiplatelet.  She is intol to statin.    2.  Hyperlipidemia:  She is intol to statins.  06/12/2014: ALT 18      -  Check Lipids today.    -  Consider resuming Zetia vs referral to [facility] for a PCSK-9 trial.   Disposition:   FU with Dr. Peter [name] 1 year.    Signed, Versie Starks, MHS 06/18/2014 10:03 AM    [facility] [address], [place],   [address] Phone: [phone]; Fax: [phone]

## 2014-06-18 ENCOUNTER — Encounter: Payer: Self-pay | Admitting: Physician Assistant

## 2014-06-18 ENCOUNTER — Ambulatory Visit (INDEPENDENT_AMBULATORY_CARE_PROVIDER_SITE_OTHER): Payer: BC Managed Care – PPO | Admitting: Physician Assistant

## 2014-06-18 VITALS — BP 130/80 | HR 80 | Ht 68.0 in | Wt 195.0 lb

## 2014-06-18 DIAGNOSIS — E785 Hyperlipidemia, unspecified: Secondary | ICD-10-CM

## 2014-06-18 DIAGNOSIS — I2510 Atherosclerotic heart disease of native coronary artery without angina pectoris: Secondary | ICD-10-CM

## 2014-06-18 LAB — LIPID PANEL
CHOL/HDL RATIO: 4
Cholesterol: 273 mg/dL — ABNORMAL HIGH (ref 0–200)
HDL: 71.2 mg/dL (ref >39.00)
LDL Cholesterol: 176 mg/dL — ABNORMAL HIGH (ref 0–99)
NONHDL: 201.8
TRIGLYCERIDES: 131 mg/dL (ref 0.0–149.0)
VLDL: 26.2 mg/dL (ref 0.0–40.0)

## 2014-06-18 NOTE — Patient Instructions (Signed)
LAB WORK TODAY; LIPID PANEL  Your physician wants you to follow-up in: [name]. You will receive a reminder letter in the mail two months in advance. If you don't receive a letter, please call our office to schedule the follow-up appointment.   Your physician recommends that you continue on your current medications as directed. Please refer to the Current Medication list given to you today.

## 2014-07-09 ENCOUNTER — Encounter: Payer: Self-pay | Admitting: Pharmacist Clinician (PhC)/ Clinical Pharmacy Specialist

## 2014-07-09 ENCOUNTER — Ambulatory Visit (INDEPENDENT_AMBULATORY_CARE_PROVIDER_SITE_OTHER): Payer: BLUE CROSS/BLUE SHIELD | Admitting: Pharmacist Clinician (PhC)/ Clinical Pharmacy Specialist

## 2014-07-09 VITALS — Ht 68.0 in | Wt 193.2 lb

## 2014-07-09 DIAGNOSIS — E785 Hyperlipidemia, unspecified: Secondary | ICD-10-CM

## 2014-07-09 MED ORDER — EZETIMIBE 10 MG PO TABS: 10.0000 mg | ORAL_TABLET | Freq: Every day | ORAL | Status: DC

## 2014-07-09 NOTE — Patient Instructions (Addendum)
Restart Zetia 10 mg once daily.  After 2 weeks, try Livalo 2 mg once weekly.  If you are not able to tolerate this please call and let me know.  Kristin at [phone] x [phone]  Continue with healthy diet and exercise regimen.  Repeat lipid panel in 3 months, see [place] about 1 week later

## 2014-07-09 NOTE — Progress Notes (Signed)
07/09/2014 Rachel Clark 11-18-1950 [id number]   HPI:  Rachel Clark is a 64 y.o. female patient of Dr. [name], who presents today for a lipid clinic evaluation.  Her medical history includes past colon cancer, seizure disorder and fibromyalgia.  RF:  CAD post CABG x 2 in 2007  Meds: none currently   Intolerant: per patient intolerant to statins - it has been several years since she took these, believes it was Zocor and Lipitor.  We can pull old charts from Dr. [name] if necessary.  She has not tried Crestor.  Tried in the past: took Zetia in past, not sure when she last took, doesn't recall any side effects  Family history: father died of MI at the age of 62  Diet: healthy per patient.  Does not eat red meats, some chicken, fish, pork.  Diet is high in beans, fresh fruits and vegetables, nuts  Exercise: walks dog 1+ miles/day    Labs:  06/2014:  TC 273, TG 131, HDL 71.2, LDL 176, nonHDL 201.8 08/2011:    TC 268, TG 107, HDL 82, LDL 165, nonHDL 186   Current Outpatient Prescriptions  Medication Sig Dispense Refill  . carbamazepine (TEGRETOL-XR) 100 MG 12 hr tablet TAKE 5 TABLETS BY MOUTH TWICE A DAY 300 tablet 5  . diphenhydrAMINE (BENADRYL) 25 MG tablet Take 25 mg by mouth every 6 (six) hours as needed. For allergies.    . DULoxetine (CYMBALTA) 60 MG capsule Take 60 mg by mouth every morning.    Marland Kitchen EFFIENT 10 MG TABS TAKE 1 TABLET (10 MG TOTAL) BY MOUTH DAILY. 30 tablet 1  . estradiol (VIVELLE-DOT) 0.075 MG/24HR Place 1 patch onto the skin 2 (two) times a week. On Sundays and Wednesdays.    Marland Kitchen ethosuximide (ZARONTIN) 250 MG capsule Taking 1 in am, 2 in the afternoon, and 2 at night 150 capsule 5  . levothyroxine (SYNTHROID, LEVOTHROID) 50 MCG tablet Take 50 mcg by mouth daily.     . nitroGLYCERIN (NITROSTAT) 0.4 MG SL tablet Place 1 tablet (0.4 mg total) under the tongue every 5 (five) minutes as needed. For chest pain. 25 tablet 1  . TEGRETOL-XR 100 MG 12 hr tablet TAKE 5  TABLETS BY MOUTH TWICE A DAY 300 tablet 11   No current facility-administered medications for this visit.    Allergies  Allergen Reactions  . Aspirin Other (See Comments)    GI upset  . Erythromycin     Gi upset  . Imdur [Isosorbide Mononitrate]     Pt does not recall reaction  . Lisinopril     cough  . Niacin And Related     Pt does not recall reactions   . Penicillins     Gi upset  . Statins     Muscle pain  . Sulfa Drugs Cross Reactors Swelling  . Tetracyclines & Related     Past Medical History  Diagnosis Date  . Coronary artery disease     Has had remote NSTEMI with PCI to the RCA; S/P CABG in 2007; LAST CATH WITH MODERATE DISEASE IN A VERY SMALL DIAGONAL BRANCH; SHE IS MANAGED MEDICALLY  . Dyslipidemia   . Seizure disorder   . History of fibromyalgia   . Chest pain   . Monoclonal gammopathy of unknown significance   . Colon cancer 12/04/2011    s/p Laparoscopic-assisted transverse colectomy on 12/19/2011 by Dr. Donne Hazel.  pT3 N0 M0.   Marland Kitchen Hyperlipidemia   . Myocardial infarction 2007  .  PONV (postoperative nausea and vomiting)   . Bronchitis     hx of;>68yrs  . Headache(784.0)   . Fibromyalgia   . Arthritis     hands   . GERD (gastroesophageal reflux disease)     takes Pantoprazole daily  . Gastric ulcer   . Iron deficiency anemia 11/17/2011  . Hypothyroid     takes Levothyroxine daily  . Depression     takes Cymbalta daily  . Diarrhea 05/10/2012  . Diarrhea 05/10/2012  . Wears glasses   . Seizures 12/06/11    last seizure over 40 yrs ago;takes Tegretol     Height 5\' 8"  (1.727 m), weight 193 lb 4 oz (87.658 kg).   ASSESSMENT AND PLAN:  Tommy Medal PharmD CPP [facility]

## 2014-07-09 NOTE — Assessment & Plan Note (Signed)
LDL not at goal.  Pt unsure of why not currently taking Zetia.  Will restart today at 10 mg daily.  After 2 weeks on Zetia, will have patient try sample of Livalo 2 mg once weekly.  She is to call if unable to tolerate this.  Previous statins have caused muscle problems within hours of dose.  Will repeat labs in 3 months and see patient thereafter.  She is to continue with healthy lifestyle/dietary habits.

## 2014-07-14 ENCOUNTER — Telehealth: Payer: Self-pay | Admitting: Neurology

## 2014-07-14 MED ORDER — ETHOSUXIMIDE 250 MG PO CAPS: ORAL_CAPSULE | ORAL | Status: DC

## 2014-07-14 NOTE — Telephone Encounter (Signed)
Rx has been sent  

## 2014-07-14 NOTE — Telephone Encounter (Signed)
Patient is calling because she needs a Rx called in for Zarontin. Please call to [facility] Patient does not want to do mail order any more. Thank you.

## 2014-08-12 ENCOUNTER — Telehealth: Payer: Self-pay

## 2014-08-12 NOTE — Telephone Encounter (Signed)
Patient called no answer.Left message on personal voice mail insurance will no longer cover zetia.Spoke to [place] no replacement for zetia.Advised to follow diet.

## 2014-10-05 ENCOUNTER — Other Ambulatory Visit (INDEPENDENT_AMBULATORY_CARE_PROVIDER_SITE_OTHER): Payer: BLUE CROSS/BLUE SHIELD | Admitting: *Deleted

## 2014-10-05 DIAGNOSIS — E785 Hyperlipidemia, unspecified: Secondary | ICD-10-CM

## 2014-10-05 LAB — HEPATIC FUNCTION PANEL
ALT: 14 U/L (ref 0–35)
AST: 13 U/L (ref 0–37)
Albumin: 3.9 g/dL (ref 3.5–5.2)
Alkaline Phosphatase: 90 U/L (ref 39–117)
Bilirubin, Direct: 0.1 mg/dL (ref 0.0–0.3)
Total Bilirubin: 0.3 mg/dL (ref 0.2–1.2)
Total Protein: 7.2 g/dL (ref 6.0–8.3)

## 2014-10-05 LAB — LIPID PANEL
CHOL/HDL RATIO: 3
Cholesterol: 271 mg/dL — ABNORMAL HIGH (ref 0–200)
HDL: 79.1 mg/dL (ref >39.00)
LDL Cholesterol: 170 mg/dL — ABNORMAL HIGH (ref 0–99)
NonHDL: 191.9
Triglycerides: 109 mg/dL (ref 0.0–149.0)
VLDL: 21.8 mg/dL (ref 0.0–40.0)

## 2014-10-05 NOTE — Addendum Note (Signed)
Addended by: Eulis Foster on: 10/05/2014 08:52 AM   Modules accepted: Orders

## 2014-10-09 ENCOUNTER — Telehealth: Payer: Self-pay | Admitting: Hematology and Oncology

## 2014-10-09 NOTE — Telephone Encounter (Signed)
s.w. pt and advised on 5.10 appt moved to 5.17 per MD out of office....pt ok and aware

## 2014-10-13 ENCOUNTER — Encounter: Payer: Self-pay | Admitting: Pharmacist Clinician (PhC)/ Clinical Pharmacy Specialist

## 2014-10-13 ENCOUNTER — Ambulatory Visit (INDEPENDENT_AMBULATORY_CARE_PROVIDER_SITE_OTHER): Payer: BLUE CROSS/BLUE SHIELD | Admitting: Pharmacist Clinician (PhC)/ Clinical Pharmacy Specialist

## 2014-10-13 DIAGNOSIS — E785 Hyperlipidemia, unspecified: Secondary | ICD-10-CM

## 2014-10-13 MED ORDER — PITAVASTATIN CALCIUM 4 MG PO TABS: 2.0000 mg | ORAL_TABLET | Freq: Every day | ORAL | Status: DC

## 2014-10-13 NOTE — Assessment & Plan Note (Signed)
Today her LDL cholesterol is unchanged, despite taking Zetia 10 mg daily.  She continue to eat a healthy diet and walks daily with her dog. States she feels great and has no concerns.  She is still unsure about trying a PCSK-9 inhibitor.  In the past she has had muscle problems with statin drugs and her fibromyaligia, but today is willing to try Livalo.  Will start her at 2 mg daily and asked that she contact us if she is not able to tolerate.  Assuming that she will do well with this, will repeat labs in 3 months and see her then.

## 2014-10-13 NOTE — Progress Notes (Signed)
10/13/2014 Rachel Clark 1951-04-18 [id number]   HPI:  Rachel Clark is a 64 y.o. female patient of Dr. [name], who presents today for a lipid clinic visit.  Her medical history includes past colon cancer, seizure disorder and fibromyalgia.  RF:  CAD post CABG x 2 in 2007  Meds: Zetia 10 mg daily   Intolerant: per patient intolerant to statins - it has been several years since she took these, believes it was Zocor and Lipitor.  We can pull old charts from Dr. [name] if necessary.  She has not tried Personal assistant  Family history: father died of MI at the age of 73  Diet: healthy per patient.  Does not eat red meats, some chicken, fish, pork.  Diet is high in beans, fresh fruits and vegetables, nuts  Exercise: walks dog 1+ miles/day    Labs:  10/2014:    TC 271, TG 109, HDL 79.1, LDL 170, nonHDL 191.9 (zetia 10 mg) 06/2014:  TC 273, TG 131, HDL 71.2, LDL 176, nonHDL 201.8 08/2011:    TC 268, TG 107, HDL 82, LDL 165, nonHDL 186   Current Outpatient Prescriptions  Medication Sig Dispense Refill  . carbamazepine (TEGRETOL-XR) 100 MG 12 hr tablet TAKE 5 TABLETS BY MOUTH TWICE A DAY 300 tablet 5  . diphenhydrAMINE (BENADRYL) 25 MG tablet Take 25 mg by mouth every 6 (six) hours as needed. For allergies.    . DULoxetine (CYMBALTA) 60 MG capsule Take 60 mg by mouth every morning.    Marland Kitchen EFFIENT 10 MG TABS TAKE 1 TABLET (10 MG TOTAL) BY MOUTH DAILY. 30 tablet 1  . estradiol (VIVELLE-DOT) 0.075 MG/24HR Place 1 patch onto the skin 2 (two) times a week. On Sundays and Wednesdays.    Marland Kitchen ethosuximide (ZARONTIN) 250 MG capsule Taking 1 in am, 2 in the afternoon, and 2 at night 150 capsule 5  . ezetimibe (ZETIA) 10 MG tablet Take 1 tablet (10 mg total) by mouth daily. 30 tablet 3  . levothyroxine (SYNTHROID, LEVOTHROID) 50 MCG tablet Take 50 mcg by mouth daily.     . nitroGLYCERIN (NITROSTAT) 0.4 MG SL tablet Place 1 tablet (0.4 mg total) under the tongue every 5 (five) minutes as needed. For  chest pain. 25 tablet 1  . TEGRETOL-XR 100 MG 12 hr tablet TAKE 5 TABLETS BY MOUTH TWICE A DAY 300 tablet 11   No current facility-administered medications for this visit.    Allergies  Allergen Reactions  . Aspirin Other (See Comments)    GI upset  . Erythromycin     Gi upset  . Imdur [Isosorbide Mononitrate]     Pt does not recall reaction  . Lisinopril     cough  . Niacin And Related     Pt does not recall reactions   . Penicillins     Gi upset  . Statins     Muscle pain  . Sulfa Drugs Cross Reactors Swelling  . Tetracyclines & Related     Past Medical History  Diagnosis Date  . Coronary artery disease     Has had remote NSTEMI with PCI to the RCA; S/P CABG in 2007; LAST CATH WITH MODERATE DISEASE IN A VERY SMALL DIAGONAL BRANCH; SHE IS MANAGED MEDICALLY  . Dyslipidemia   . Seizure disorder   . History of fibromyalgia   . Chest pain   . Monoclonal gammopathy of unknown significance   . Colon cancer 12/04/2011    s/p Laparoscopic-assisted transverse colectomy on  12/19/2011 by Dr. Donne Hazel.  pT3 N0 M0.   Marland Kitchen Hyperlipidemia   . Myocardial infarction 2007  . PONV (postoperative nausea and vomiting)   . Bronchitis     hx of;>77yrs  . Headache(784.0)   . Fibromyalgia   . Arthritis     hands   . GERD (gastroesophageal reflux disease)     takes Pantoprazole daily  . Gastric ulcer   . Iron deficiency anemia 11/17/2011  . Hypothyroid     takes Levothyroxine daily  . Depression     takes Cymbalta daily  . Diarrhea 05/10/2012  . Diarrhea 05/10/2012  . Wears glasses   . Seizures 12/06/11    last seizure over 40 yrs ago;takes Tegretol     There were no vitals taken for this visit.   ASSESSMENT AND PLAN:  Tommy Medal PharmD CPP [facility]

## 2014-10-13 NOTE — Patient Instructions (Signed)
Continue taking Zetia 10 mg daily  Add Livalo 2 mg (1/2 tablet) daily.   Continue to eat a healthy diet and stay active  Repeat labs in 3 months, see me the following week

## 2014-10-26 ENCOUNTER — Telehealth: Payer: Self-pay | Admitting: Cardiology

## 2014-10-26 ENCOUNTER — Encounter (HOSPITAL_COMMUNITY): Payer: Self-pay | Admitting: *Deleted

## 2014-10-26 ENCOUNTER — Inpatient Hospital Stay (HOSPITAL_COMMUNITY)
Admission: EM | Admit: 2014-10-26 | Discharge: 2014-10-27 | DRG: 303 | Disposition: A | Discharge status: Home / Self Care | Payer: BLUE CROSS/BLUE SHIELD | Attending: Cardiovascular Disease | Admitting: Cardiovascular Disease

## 2014-10-26 ENCOUNTER — Emergency Department (HOSPITAL_COMMUNITY): Payer: BLUE CROSS/BLUE SHIELD

## 2014-10-26 DIAGNOSIS — Z9221 Personal history of antineoplastic chemotherapy: Secondary | ICD-10-CM

## 2014-10-26 DIAGNOSIS — R110 Nausea: Secondary | ICD-10-CM

## 2014-10-26 DIAGNOSIS — D509 Iron deficiency anemia, unspecified: Secondary | ICD-10-CM | POA: Diagnosis present

## 2014-10-26 DIAGNOSIS — E039 Hypothyroidism, unspecified: Secondary | ICD-10-CM | POA: Diagnosis present

## 2014-10-26 DIAGNOSIS — Z880 Allergy status to penicillin: Secondary | ICD-10-CM | POA: Diagnosis not present

## 2014-10-26 DIAGNOSIS — Z85038 Personal history of other malignant neoplasm of large intestine: Secondary | ICD-10-CM | POA: Diagnosis not present

## 2014-10-26 DIAGNOSIS — D472 Monoclonal gammopathy: Secondary | ICD-10-CM | POA: Diagnosis present

## 2014-10-26 DIAGNOSIS — K219 Gastro-esophageal reflux disease without esophagitis: Secondary | ICD-10-CM | POA: Diagnosis present

## 2014-10-26 DIAGNOSIS — F329 Major depressive disorder, single episode, unspecified: Secondary | ICD-10-CM | POA: Diagnosis present

## 2014-10-26 DIAGNOSIS — I252 Old myocardial infarction: Secondary | ICD-10-CM

## 2014-10-26 DIAGNOSIS — Z79899 Other long term (current) drug therapy: Secondary | ICD-10-CM | POA: Diagnosis not present

## 2014-10-26 DIAGNOSIS — M797 Fibromyalgia: Secondary | ICD-10-CM | POA: Diagnosis present

## 2014-10-26 DIAGNOSIS — Z882 Allergy status to sulfonamides status: Secondary | ICD-10-CM

## 2014-10-26 DIAGNOSIS — R079 Chest pain, unspecified: Secondary | ICD-10-CM

## 2014-10-26 DIAGNOSIS — Z951 Presence of aortocoronary bypass graft: Secondary | ICD-10-CM | POA: Diagnosis not present

## 2014-10-26 DIAGNOSIS — G40909 Epilepsy, unspecified, not intractable, without status epilepticus: Secondary | ICD-10-CM | POA: Diagnosis present

## 2014-10-26 DIAGNOSIS — Z90711 Acquired absence of uterus with remaining cervical stump: Secondary | ICD-10-CM | POA: Diagnosis present

## 2014-10-26 DIAGNOSIS — Z888 Allergy status to other drugs, medicaments and biological substances status: Secondary | ICD-10-CM

## 2014-10-26 DIAGNOSIS — E785 Hyperlipidemia, unspecified: Secondary | ICD-10-CM

## 2014-10-26 DIAGNOSIS — I25110 Atherosclerotic heart disease of native coronary artery with unstable angina pectoris: Principal | ICD-10-CM | POA: Diagnosis present

## 2014-10-26 DIAGNOSIS — Z9049 Acquired absence of other specified parts of digestive tract: Secondary | ICD-10-CM | POA: Diagnosis present

## 2014-10-26 DIAGNOSIS — Z87891 Personal history of nicotine dependence: Secondary | ICD-10-CM | POA: Diagnosis not present

## 2014-10-26 DIAGNOSIS — I2510 Atherosclerotic heart disease of native coronary artery without angina pectoris: Secondary | ICD-10-CM

## 2014-10-26 DIAGNOSIS — R569 Unspecified convulsions: Secondary | ICD-10-CM

## 2014-10-26 DIAGNOSIS — Z881 Allergy status to other antibiotic agents status: Secondary | ICD-10-CM | POA: Diagnosis not present

## 2014-10-26 DIAGNOSIS — I200 Unstable angina: Secondary | ICD-10-CM

## 2014-10-26 HISTORY — DX: Other generalized epilepsy and epileptic syndromes, not intractable, without status epilepticus: G40.409

## 2014-10-26 LAB — BASIC METABOLIC PANEL
Anion gap: 8 (ref 5–15)
BUN: 11 mg/dL (ref 6–23)
CALCIUM: 8.8 mg/dL (ref 8.4–10.5)
CHLORIDE: 102 mmol/L (ref 96–112)
CO2: 27 mmol/L (ref 19–32)
CREATININE: 0.7 mg/dL (ref 0.50–1.10)
GFR calc Af Amer: >90 mL/min (ref >90)
GFR, EST NON AFRICAN AMERICAN: 89 mL/min — AB (ref >90)
Glucose, Bld: 133 mg/dL — ABNORMAL HIGH (ref 70–99)
Potassium: 4.2 mmol/L (ref 3.5–5.1)
Sodium: 137 mmol/L (ref 135–145)

## 2014-10-26 LAB — HEPATIC FUNCTION PANEL
ALBUMIN: 3.6 g/dL (ref 3.5–5.2)
ALK PHOS: 79 U/L (ref 39–117)
ALT: 14 U/L (ref 0–35)
AST: 14 U/L (ref 0–37)
BILIRUBIN TOTAL: 0.5 mg/dL (ref 0.3–1.2)
Bilirubin, Direct: <0.1 mg/dL (ref 0.0–0.5)
Total Protein: 7 g/dL (ref 6.0–8.3)

## 2014-10-26 LAB — CBC
HCT: 40.1 % (ref 36.0–46.0)
HEMOGLOBIN: 13.7 g/dL (ref 12.0–15.0)
MCH: 30.1 pg (ref 26.0–34.0)
MCHC: 34.2 g/dL (ref 30.0–36.0)
MCV: 88.1 fL (ref 78.0–100.0)
Platelets: 308 10*3/uL (ref 150–400)
RBC: 4.55 MIL/uL (ref 3.87–5.11)
RDW: 13.2 % (ref 11.5–15.5)
WBC: 8.3 10*3/uL (ref 4.0–10.5)

## 2014-10-26 LAB — TROPONIN I
Troponin I: <0.03 ng/mL (ref <0.031)
Troponin I: <0.03 ng/mL (ref <0.031)

## 2014-10-26 LAB — I-STAT TROPONIN, ED
TROPONIN I, POC: 0 ng/mL (ref 0.00–0.08)
TROPONIN I, POC: 0 ng/mL (ref 0.00–0.08)

## 2014-10-26 LAB — PROTIME-INR
INR: 1.07 (ref 0.00–1.49)
Prothrombin Time: 14 seconds (ref 11.6–15.2)

## 2014-10-26 LAB — AMYLASE: AMYLASE: 47 U/L (ref 0–105)

## 2014-10-26 LAB — LIPASE, BLOOD: Lipase: 23 U/L (ref 11–59)

## 2014-10-26 LAB — HEPARIN LEVEL (UNFRACTIONATED): Heparin Unfractionated: 0.19 IU/mL — ABNORMAL LOW (ref 0.30–0.70)

## 2014-10-26 LAB — TSH: TSH: 0.822 u[IU]/mL (ref 0.350–4.500)

## 2014-10-26 LAB — MAGNESIUM: Magnesium: 2.2 mg/dL (ref 1.5–2.5)

## 2014-10-26 MED ORDER — ESTRADIOL 0.1 MG/24HR TD PTWK: 0.1000 mg | MEDICATED_PATCH | TRANSDERMAL | Status: DC

## 2014-10-26 MED ORDER — NITROGLYCERIN 0.4 MG SL SUBL
0.4000 mg | SUBLINGUAL_TABLET | SUBLINGUAL | Status: DC | PRN
  Administered 2014-10-26 (×2): 0.4 mg via SUBLINGUAL
  Filled 2014-10-26: qty 1

## 2014-10-26 MED ORDER — DULOXETINE HCL 60 MG PO CPEP
60.0000 mg | ORAL_CAPSULE | Freq: Every morning | ORAL | Status: DC
  Administered 2014-10-27: 60 mg via ORAL
  Filled 2014-10-26: qty 1

## 2014-10-26 MED ORDER — LEVOTHYROXINE SODIUM 50 MCG PO TABS
50.0000 ug | ORAL_TABLET | Freq: Every day | ORAL | Status: DC
  Administered 2014-10-27: 50 ug via ORAL
  Filled 2014-10-26: qty 1

## 2014-10-26 MED ORDER — ONDANSETRON HCL 4 MG/2ML IJ SOLN
4.0000 mg | Freq: Once | INTRAMUSCULAR | Status: DC
  Filled 2014-10-26: qty 2

## 2014-10-26 MED ORDER — ETHOSUXIMIDE 250 MG PO CAPS
250.0000 mg | ORAL_CAPSULE | Freq: Every day | ORAL | Status: DC
  Administered 2014-10-27: 250 mg via ORAL
  Filled 2014-10-26: qty 1

## 2014-10-26 MED ORDER — CARBAMAZEPINE ER 400 MG PO TB12
500.0000 mg | ORAL_TABLET | Freq: Two times a day (BID) | ORAL | Status: DC
  Administered 2014-10-26 – 2014-10-27 (×2): 500 mg via ORAL
  Filled 2014-10-26 (×4): qty 1

## 2014-10-26 MED ORDER — ETHOSUXIMIDE 250 MG PO CAPS
500.0000 mg | ORAL_CAPSULE | Freq: Every day | ORAL | Status: DC
  Administered 2014-10-27: 500 mg via ORAL
  Filled 2014-10-26 (×2): qty 2

## 2014-10-26 MED ORDER — ACETAMINOPHEN 325 MG PO TABS: 650.0000 mg | ORAL_TABLET | ORAL | Status: DC | PRN

## 2014-10-26 MED ORDER — PRAVASTATIN SODIUM 40 MG PO TABS
40.0000 mg | ORAL_TABLET | Freq: Every day | ORAL | Status: DC
  Filled 2014-10-26: qty 1

## 2014-10-26 MED ORDER — PANTOPRAZOLE SODIUM 40 MG PO TBEC
40.0000 mg | DELAYED_RELEASE_TABLET | Freq: Two times a day (BID) | ORAL | Status: DC
  Administered 2014-10-26 – 2014-10-27 (×2): 40 mg via ORAL
  Filled 2014-10-26 (×2): qty 1

## 2014-10-26 MED ORDER — ONDANSETRON HCL 4 MG/2ML IJ SOLN: 4.0000 mg | Freq: Four times a day (QID) | INTRAMUSCULAR | Status: DC | PRN

## 2014-10-26 MED ORDER — HEPARIN (PORCINE) IN NACL 100-0.45 UNIT/ML-% IJ SOLN
1400.0000 [IU]/h | INTRAMUSCULAR | Status: DC
  Administered 2014-10-26: 1100 [IU]/h via INTRAVENOUS
  Administered 2014-10-27: 1400 [IU]/h via INTRAVENOUS
  Filled 2014-10-26 (×2): qty 250

## 2014-10-26 MED ORDER — METOPROLOL TARTRATE 12.5 MG HALF TABLET
12.5000 mg | ORAL_TABLET | Freq: Two times a day (BID) | ORAL | Status: DC
  Administered 2014-10-26 – 2014-10-27 (×2): 12.5 mg via ORAL
  Filled 2014-10-26 (×2): qty 1

## 2014-10-26 MED ORDER — SODIUM CHLORIDE 0.9 % IV SOLN
INTRAVENOUS | Status: DC
  Administered 2014-10-26: 18:00:00 via INTRAVENOUS

## 2014-10-26 MED ORDER — ASPIRIN 81 MG PO CHEW
324.0000 mg | CHEWABLE_TABLET | Freq: Once | ORAL | Status: AC
  Administered 2014-10-26: 324 mg via ORAL
  Filled 2014-10-26: qty 4

## 2014-10-26 MED ORDER — NITROGLYCERIN IN D5W 200-5 MCG/ML-% IV SOLN
5.0000 ug/min | INTRAVENOUS | Status: DC
  Administered 2014-10-26: 5 ug/min via INTRAVENOUS
  Filled 2014-10-26: qty 250

## 2014-10-26 MED ORDER — DIPHENHYDRAMINE HCL 25 MG PO CAPS
25.0000 mg | ORAL_CAPSULE | Freq: Four times a day (QID) | ORAL | Status: DC | PRN
Start: 2014-10-26 — End: 2014-10-27
  Filled 2014-10-26: qty 1

## 2014-10-26 MED ORDER — VITAMIN D3 25 MCG (1000 UNIT) PO TABS
1000.0000 [IU] | ORAL_TABLET | Freq: Every day | ORAL | Status: DC
Start: 2014-10-27 — End: 2014-10-27
  Administered 2014-10-27: 1000 [IU] via ORAL
  Filled 2014-10-26 (×2): qty 1

## 2014-10-26 MED ORDER — ETHOSUXIMIDE 250 MG PO CAPS
500.0000 mg | ORAL_CAPSULE | Freq: Every day | ORAL | Status: DC
  Administered 2014-10-26: 500 mg via ORAL
  Filled 2014-10-26 (×3): qty 2

## 2014-10-26 MED ORDER — NITROGLYCERIN 0.4 MG SL SUBL: 0.4000 mg | SUBLINGUAL_TABLET | SUBLINGUAL | Status: DC | PRN

## 2014-10-26 MED ORDER — ETHOSUXIMIDE 250 MG PO CAPS
500.0000 mg | ORAL_CAPSULE | ORAL | Status: AC
  Filled 2014-10-26: qty 2

## 2014-10-26 MED ORDER — LORATADINE 10 MG PO TABS: 10.0000 mg | ORAL_TABLET | Freq: Every day | ORAL | Status: DC | PRN

## 2014-10-26 MED ORDER — HEPARIN BOLUS VIA INFUSION
4000.0000 [IU] | Freq: Once | INTRAVENOUS | Status: AC
  Administered 2014-10-26: 4000 [IU] via INTRAVENOUS
  Filled 2014-10-26: qty 4000

## 2014-10-26 NOTE — ED Notes (Signed)
Family at bedside. 

## 2014-10-26 NOTE — ED Provider Notes (Signed)
CSN: [id number]     Arrival date & time 10/26/14  1034 History   First MD Initiated Contact with Patient 10/26/14 1103     Chief Complaint  Patient presents with  . Dizziness  . Arm Pain     Patient is a 64 y.o. female presenting with dizziness and arm pain. The history is provided by the patient. No language interpreter was used.  Dizziness Arm Pain   Rachel Clark presents for evaluation of chest pain/left arm pain. She reports that for the last 2-3 days she's had 3-4 episodes daily of waves of heat from her upper abdomen up to her head. She has associated nausea and lightheadedness. Episodes last about 5 minutes and then they resolve when she lays down. There are no clear triggers. There has been no change in her activity recently. Today she had an episode and there was associated chest discomfort and left upper arm discomfort. She took nitroglycerin and her symptoms resolved. She has a history of two-vessel bypass about 8 years ago. She does not take daily aspirin due to recent history of ulcers. She has a history of colon cancer status post resection and chemotherapy 2 years ago. She has not had similar symptoms previously. She is unable to tolerate statins. She has no history of hypertension. She does have a history of seizure disorder. During these episodes she has mild shortness of breath. Symptoms are moderate, waxing and waning, worsening.  Past Medical History  Diagnosis Date  . Coronary artery disease     Has had remote NSTEMI with PCI to the RCA; S/P CABG in 2007; LAST CATH WITH MODERATE DISEASE IN A VERY SMALL DIAGONAL BRANCH; SHE IS MANAGED MEDICALLY  . Dyslipidemia   . Seizure disorder   . History of fibromyalgia   . Chest pain   . Monoclonal gammopathy of unknown significance   . Colon cancer 12/04/2011    s/p Laparoscopic-assisted transverse colectomy on 12/19/2011 by Dr. Donne Hazel.  pT3 N0 M0.   Marland Kitchen Hyperlipidemia   . Myocardial infarction 2007  . PONV (postoperative nausea  and vomiting)   . Bronchitis     hx of;>35yrs  . Headache(784.0)   . Fibromyalgia   . Arthritis     hands   . GERD (gastroesophageal reflux disease)     takes Pantoprazole daily  . Gastric ulcer   . Iron deficiency anemia 11/17/2011  . Hypothyroid     takes Levothyroxine daily  . Depression     takes Cymbalta daily  . Diarrhea 05/10/2012  . Diarrhea 05/10/2012  . Wears glasses   . Seizures 12/06/11    last seizure over 40 yrs ago;takes Tegretol    Past Surgical History  Procedure Laterality Date  . Partial hysterectomy  20+yrs ago  . Posterior laminectomy / decompression cervical spine      20+yrs ago  . Cardiac catheterization  05/20/2009    obstructive native vessel disease in LAD, RCA, and first diagonal, patent vein graft to distal RCA and LIMA to LAD,normal. ef 60%  . Colonoscopy    . Coronary artery bypass graft  2007    x 2  . Coronary angioplasty      1 stent  . Colon resection  12/19/2011    Procedure: COLON RESECTION LAPAROSCOPIC;  Surgeon: Rolm Bookbinder, MD;  Location: [facility];  Service: General;  Laterality: N/A;  laparoscopic hand assisted partial colon resection  . Portacath placement  06/04/2012    Procedure: INSERTION PORT-A-CATH;  Surgeon: Rolm Bookbinder, MD;  Location: [facility] OR;  Service: General;  Laterality: N/A;  Insertion of port-a-cath   . Port-a-cath removal N/A 06/12/2013    Procedure: REMOVAL PORT-A-CATH;  Surgeon: Rolm Bookbinder, MD;  Location: [facility];  Service: General;  Laterality: N/A;   Family History  Problem Relation Age of Onset  . Heart attack Father   . Heart disease Father   . Heart attack Brother 23  . Cancer Mother     lung  . Stroke Neg Hx    History  Substance Use Topics  . Smoking status: Former Smoker -- 0.30 packs/day for 20 years    Types: Cigarettes    Quit date: 06/06/1981  . Smokeless tobacco: Never Used     Comment: quit over 60yrs ago  . Alcohol Use: Yes     Comment: occ wine   OB History     No data available     Review of Systems  Neurological: Positive for dizziness.  All other systems reviewed and are negative.     Allergies  Aspirin; Erythromycin; Imdur; Lisinopril; Niacin and related; Penicillins; Statins; Sulfa drugs cross reactors; and Tetracyclines & related  Home Medications   Prior to Admission medications   Medication Sig Start Date End Date Taking? Authorizing Provider  carbamazepine (TEGRETOL-XR) 100 MG 12 hr tablet TAKE 5 TABLETS BY MOUTH TWICE A DAY 02/19/14   Megan P Millikan, NP  diphenhydrAMINE (BENADRYL) 25 MG tablet Take 25 mg by mouth every 6 (six) hours as needed. For allergies.    Historical Provider, MD  DULoxetine (CYMBALTA) 60 MG capsule Take 60 mg by mouth every morning.    Historical Provider, MD  EFFIENT 10 MG TABS TAKE 1 TABLET (10 MG TOTAL) BY MOUTH DAILY. 10/25/12   Peter M [name], MD  estradiol (VIVELLE-DOT) 0.075 MG/24HR Place 1 patch onto the skin 2 (two) times a week. On Sundays and Wednesdays.    Historical Provider, MD  ethosuximide (ZARONTIN) 250 MG capsule Taking 1 in am, 2 in the afternoon, and 2 at night 07/14/14   Kathrynn Ducking, MD  ezetimibe (ZETIA) 10 MG tablet Take 1 tablet (10 mg total) by mouth daily. 07/09/14   Peter M [name], MD  levothyroxine (SYNTHROID, LEVOTHROID) 50 MCG tablet Take 50 mcg by mouth daily.     Historical Provider, MD  nitroGLYCERIN (NITROSTAT) 0.4 MG SL tablet Place 1 tablet (0.4 mg total) under the tongue every 5 (five) minutes as needed. For chest pain. 06/09/14   Peter M [name], MD  Pitavastatin Calcium 4 MG TABS Take 0.5 tablets (2 mg total) by mouth daily. 10/13/14   Peter M [name], MD  TEGRETOL-XR 100 MG 12 hr tablet TAKE 5 TABLETS BY MOUTH TWICE A DAY 03/06/14   Kathrynn Ducking, MD   BP 160/77 mmHg  Pulse 68  Temp(Src) 97.4 F (36.3 C) (Oral)  Resp 18  Ht 5\' 9"  (1.753 m)  Wt 198 lb 8 oz (90.039 kg)  BMI 29.30 kg/m2  SpO2 100% Physical Exam  Constitutional: She is oriented to person, place, and  time. She appears well-developed and well-nourished.  HENT:  Head: Normocephalic and atraumatic.  Cardiovascular: Normal rate and regular rhythm.   No murmur heard. 2+ radial pulses bilaterally  Pulmonary/Chest: Effort normal and breath sounds normal. No respiratory distress.  Abdominal: Soft. There is no tenderness. There is no rebound and no guarding.  Musculoskeletal: She exhibits no edema or tenderness.  Neurological: She is alert and oriented to person, place, and time.  Skin: Skin is warm and dry.  Psychiatric: She has a normal mood and affect. Her behavior is normal.  Nursing note and vitals reviewed.   ED Course  Procedures (including critical care time) Labs Review Labs Reviewed  BASIC METABOLIC PANEL - Abnormal; Notable for the following:    Glucose, Bld 133 (*)    GFR calc non Af Amer 89 (*)    All other components within normal limits  CBC  MAGNESIUM  TSH  T4, FREE  TROPONIN I  TROPONIN I  TROPONIN I  HEMOGLOBIN A1C  PROTIME-INR  HEPATIC FUNCTION PANEL  AMYLASE  LIPASE, BLOOD  HEPARIN LEVEL (UNFRACTIONATED)  I-STAT TROPOININ, ED  Randolm Idol, ED    Imaging Review Dg Chest 2 View  10/26/2014   CLINICAL DATA:  Chest pain with left upper extremity radicular symptoms. Intermittent dizziness  EXAM: CHEST  2 VIEW  COMPARISON:  Chest radiograph June 04, 2012; chest CT October 29, 2013  FINDINGS: There is no edema or consolidation. Heart size and pulmonary vascularity are normal. No adenopathy. Patient is status post coronary artery bypass grafting. No bone lesions.  IMPRESSION: No edema or consolidation.   Electronically Signed   By: Lowella Grip III M.D.   On: 10/26/2014 12:35     EKG Interpretation   Date/Time:  Monday October 26 2014 10:39:10 EDT Ventricular Rate:  72 PR Interval:  188 QRS Duration: 84 QT Interval:  382 QTC Calculation: 418 R Axis:   53 Text Interpretation:  Normal sinus rhythm Nonspecific T wave abnormality  Abnormal ECG  Confirmed by Hazle Coca [phone]) on 10/26/2014 11:02:32 AM      MDM   Final diagnoses:  Chest pain, unspecified chest pain type  Nausea    Patient here for evaluation of episodic chest pain. EKG without any acute ischemic changes. Patient with 2 recurrent episodes in the department with no EKG changes that that time. Symptoms resolved prior to administering medications. Discussed with cardiology regarding admission for chest pain observation.    Quintella Reichert, MD 10/26/14 1640

## 2014-10-26 NOTE — ED Notes (Signed)
MD at bedside. Cardiology 

## 2014-10-26 NOTE — Telephone Encounter (Signed)
New problem   Pt need to speak to nurse because she has been having a wave from her waist to her head and she feels like she is going to pass out. Please call pt.

## 2014-10-26 NOTE — Progress Notes (Signed)
ANTICOAGULATION CONSULT NOTE - Initial Consult  Pharmacy Consult for heparin Indication: chest pain/ACS  Allergies  Allergen Reactions  . Aspirin Other (See Comments)    GI upset  . Erythromycin     Gi upset  . Imdur [Isosorbide Mononitrate]     Pt does not recall reaction  . Lisinopril     cough  . Niacin And Related     Pt does not recall reactions   . Penicillins     Gi upset  . Statins     Muscle pain  . Sulfa Drugs Cross Reactors Swelling  . Tetracyclines & Related     Patient Measurements: Height: 5\' 9"  (175.3 cm) Weight: 198 lb 8 oz (90.039 kg) IBW/kg (Calculated) : 66.2 Heparin Dosing Weight: 85 kg  Vital Signs: Temp: 97.6 F (36.4 C) (04/25 1507) Temp Source: Oral (04/25 1507) BP: 148/65 mmHg (04/25 1615) Pulse Rate: 70 (04/25 1615)  Labs:  Recent Labs  10/26/14 1050  HGB 13.7  HCT 40.1  PLT 308  CREATININE 0.70    Estimated Creatinine Clearance: 83.8 mL/min (by C-G formula based on Cr of 0.7).   Medical History: Past Medical History  Diagnosis Date  . Coronary artery disease     Has had remote NSTEMI with PCI to the RCA; S/P CABG in 2007; LAST CATH WITH MODERATE DISEASE IN A VERY SMALL DIAGONAL BRANCH; SHE IS MANAGED MEDICALLY  . Dyslipidemia   . Seizure disorder   . History of fibromyalgia   . Chest pain   . Monoclonal gammopathy of unknown significance   . Colon cancer 12/04/2011    s/p Laparoscopic-assisted transverse colectomy on 12/19/2011 by Dr. Donne Hazel.  pT3 N0 M0.   Marland Kitchen Hyperlipidemia   . Myocardial infarction 2007  . PONV (postoperative nausea and vomiting)   . Bronchitis     hx of;>53yrs  . Headache(784.0)   . Fibromyalgia   . Arthritis     hands   . GERD (gastroesophageal reflux disease)     takes Pantoprazole daily  . Gastric ulcer   . Iron deficiency anemia 11/17/2011  . Hypothyroid     takes Levothyroxine daily  . Depression     takes Cymbalta daily  . Diarrhea 05/10/2012  . Diarrhea 05/10/2012  . Wears glasses   .  Seizures 12/06/11    last seizure over 40 yrs ago;takes Tegretol     Medications:  See EMR  Assessment: 17 yoF with CAD s/p CABG 2007 presents with chest pain and left arm pain x2-3 days. Pharmacy consulted to dose heparin for unstable angina. No anticoagulation PTA. Trop x2 neg, H/H stable, plts 308, SCr 0.70 (CrCl ~80-85 ml/min).  Goal of Therapy:  Heparin level 0.3-0.7 units/ml Monitor platelets by anticoagulation protocol: Yes   Plan:  Heparin 4000 unit IV bolus, then 1100 unit/hr  6 hour HL (2300) Daily HL/CBC Monitor for signs of bleeding  Whitney Muse, PharmD Candidate 10/26/2014,4:28 PM

## 2014-10-26 NOTE — ED Notes (Signed)
Pt made aware awaiting repeat trop.

## 2014-10-26 NOTE — ED Notes (Signed)
Pt. Complained of "heat radiating upward" from lower abdomen to head w/ light headed feeling. No chest pain/sob. Dr. Ralene Bathe notified.

## 2014-10-26 NOTE — Telephone Encounter (Signed)
Left message for patient to return call.

## 2014-10-26 NOTE — ED Notes (Signed)
Pt reports having cardiac history. Reports waves of nausea and lightheaded x 3 days. More severe this am with discomfort in left arm. Denies cp or sob but is having nausea. ekg done at triage.

## 2014-10-26 NOTE — H&P (Signed)
Rachel Clark is an 64 y.o. female.    Primary Cardiologist:Dr. [name]  FAO:ZHYQM,VHQIONG S, MD  Chief Complaint:  3 days ago began with discomfort from abd to chest and now progressed to chest discomfort, similar to prior to MI/CABG   HPI:  64 y.o. female with a hx of CAD s/p CABG in 2007, ASA intolerance due to GI upset, HL with statin intolerance, colon CA s/p resection and chemotherapy, MGUS and hx seizures. She was last seen by Dr. Peter [name] in 07/2011. Admitted in 08/2011 with chest pain. MI was ruled out and Myoview was negative for ischemia. Last cardiac cath 08/2008 : FINDINGS: 1. Left Main: Normal. 2. LAD: Moderate-sized vessel with moderate diffuse disease.  Competitive flow was noted in the midvessel. 3. D1: Small vessel with moderate proximal disease. 4. LCX: Nondominant with mild luminal irregularities. 5. RCA: Patent proximal stent with 50-60% stenosis after the stent.  Mid vessel bifurcates with moderate diffuse disease noted down each  branch. PDA has mild diffuse disease. Her vein graft is seen and  is patent with good flow back to the proximal portion of the vein  graft by injecting into the native RCA. 6. Vein graft to distal RCA is patent with good distal flow noted. 7. LIMA to LAD is patent with good flow in the mid and distal vessel  with backflow noted to the proximal vessel and first diagonal. 8. LV: EF is 65%. No wall motion abnormalities. LVEDP is 14 mmHg.  IMPRESSION: 1. Obstructive native vessel disease in her left anterior descending,  right coronary artery, and first diagonal. 2. Patent vein graft to distal right coronary artery and left internal  mammary artery to left anterior descending. 3. Preserved left ventricular systolic function, ejection fraction of  65%.  Nuc in 2013 with normal stress nuc scan. No scar or ischemia, wall motion is normal.    Over last 3 days  has had hot feeling radiating from abd to chest and into throat.  Associated with SOB and diaphoresis.  Today she developed intense chest discomfort a heaviness along with the heat radiation and SOB.  NTG sl with some relief.  Prior to 3 days ago she felt well without any discomfort.  Mild lightheadedness this AM now resolved.  These symptoms wax and wane, just had recurrent episode here in ER improved with SL NTG.      Past Medical History  Diagnosis Date  . Coronary artery disease     Has had remote NSTEMI with PCI to the RCA; S/P CABG in 2007; LAST CATH WITH MODERATE DISEASE IN A VERY SMALL DIAGONAL BRANCH; SHE IS MANAGED MEDICALLY  . Dyslipidemia   . Seizure disorder   . History of fibromyalgia   . Chest pain   . Monoclonal gammopathy of unknown significance   . Colon cancer 12/04/2011    s/p Laparoscopic-assisted transverse colectomy on 12/19/2011 by Dr. Donne Hazel.  pT3 N0 M0.   Marland Kitchen Hyperlipidemia   . Myocardial infarction 2007  . PONV (postoperative nausea and vomiting)   . Bronchitis     hx of;>36yr  . Headache(784.0)   . Fibromyalgia   . Arthritis     hands   . GERD (gastroesophageal reflux disease)     takes Pantoprazole daily  . Gastric ulcer   . Iron deficiency anemia 11/17/2011  . Hypothyroid     takes Levothyroxine daily  . Depression     takes Cymbalta daily  . Diarrhea  05/10/2012  . Diarrhea 05/10/2012  . Wears glasses   . Seizures 12/06/11    last seizure over 40 yrs ago;takes Tegretol     Past Surgical History  Procedure Laterality Date  . Partial hysterectomy  20+yrs ago  . Posterior laminectomy / decompression cervical spine      20+yrs ago  . Cardiac catheterization  05/20/2009    obstructive native vessel disease in LAD, RCA, and first diagonal, patent vein graft to distal RCA and LIMA to LAD,normal. ef 60%  . Colonoscopy    . Coronary artery bypass graft  2007    x 2  . Coronary angioplasty      1 stent  . Colon resection  12/19/2011    Procedure: COLON  RESECTION LAPAROSCOPIC;  Surgeon: Rolm Bookbinder, MD;  Location: [place];  Service: General;  Laterality: N/A;  laparoscopic hand assisted partial colon resection  . Portacath placement  06/04/2012    Procedure: INSERTION PORT-A-CATH;  Surgeon: Rolm Bookbinder, MD;  Location: [place];  Service: General;  Laterality: N/A;  Insertion of port-a-cath   . Port-a-cath removal N/A 06/12/2013    Procedure: REMOVAL PORT-A-CATH;  Surgeon: Rolm Bookbinder, MD;  Location: [place];  Service: General;  Laterality: N/A;    Family History  Problem Relation Age of Onset  . Heart attack Father   . Heart disease Father   . Heart attack Brother 65  . Cancer Mother     lung  . Stroke Neg Hx    Social History:  reports that she quit smoking about 33 years ago. Her smoking use included Cigarettes. She has a 6 pack-year smoking history. She has never used smokeless tobacco. She reports that she drinks alcohol. She reports that she does not use illicit drugs.  Allergies:  Allergies  Allergen Reactions  . Aspirin Other (See Comments)    GI upset  . Erythromycin     Gi upset  . Imdur [Isosorbide Mononitrate]     Pt does not recall reaction  . Lisinopril     cough  . Niacin And Related     Pt does not recall reactions   . Penicillins     Gi upset  . Statins     Muscle pain  . Sulfa Drugs Cross Reactors Swelling  . Tetracyclines & Related     OUTPATIENT MEDICATIONS:  No current facility-administered medications on file prior to encounter.   Current Outpatient Prescriptions on File Prior to Encounter  Medication Sig Dispense Refill  . carbamazepine (TEGRETOL-XR) 100 MG 12 hr tablet TAKE 5 TABLETS BY MOUTH TWICE A DAY 300 tablet 5  . diphenhydrAMINE (BENADRYL) 25 MG tablet Take 25 mg by mouth every 6 (six) hours as needed. For allergies.    . DULoxetine (CYMBALTA) 60 MG capsule Take 60 mg by mouth every morning.    Marland Kitchen estradiol (VIVELLE-DOT) 0.075 MG/24HR Place 1 patch onto the  skin 2 (two) times a week. On Sundays and Wednesdays.    Marland Kitchen ethosuximide (ZARONTIN) 250 MG capsule Taking 1 in am, 2 in the afternoon, and 2 at night 150 capsule 5  . ezetimibe (ZETIA) 10 MG tablet Take 1 tablet (10 mg total) by mouth daily. 30 tablet 3  . levothyroxine (SYNTHROID, LEVOTHROID) 50 MCG tablet Take 50 mcg by mouth daily.     . nitroGLYCERIN (NITROSTAT) 0.4 MG SL tablet Place 1 tablet (0.4 mg total) under the tongue every 5 (five) minutes as needed. For chest pain. 25 tablet 1  .  EFFIENT 10 MG TABS TAKE 1 TABLET (10 MG TOTAL) BY MOUTH DAILY. (Patient not taking: Reported on 10/26/2014) 30 tablet 1  . Pitavastatin Calcium 4 MG TABS Take 0.5 tablets (2 mg total) by mouth daily. (Patient not taking: Reported on 10/26/2014) 28 tablet 0  . TEGRETOL-XR 100 MG 12 hr tablet TAKE 5 TABLETS BY MOUTH TWICE A DAY (Patient not taking: Reported on 10/26/2014) 300 tablet 11     Results for orders placed or performed during the hospital encounter of 10/26/14 (from the past 48 hour(s))  CBC     Status: None   Collection Time: 10/26/14 10:50 AM  Result Value Ref Range   WBC 8.3 4.0 - 10.5 K/uL   RBC 4.55 3.87 - 5.11 MIL/uL   Hemoglobin 13.7 12.0 - 15.0 g/dL   HCT 40.1 36.0 - 46.0 %   MCV 88.1 78.0 - 100.0 fL   MCH 30.1 26.0 - 34.0 pg   MCHC 34.2 30.0 - 36.0 g/dL   RDW 13.2 11.5 - 15.5 %   Platelets 308 150 - 400 K/uL  Basic metabolic panel     Status: Abnormal   Collection Time: 10/26/14 10:50 AM  Result Value Ref Range   Sodium 137 135 - 145 mmol/L   Potassium 4.2 3.5 - 5.1 mmol/L   Chloride 102 96 - 112 mmol/L   CO2 27 19 - 32 mmol/L   Glucose, Bld 133 (H) 70 - 99 mg/dL   BUN 11 6 - 23 mg/dL   Creatinine, Ser 0.70 0.50 - 1.10 mg/dL   Calcium 8.8 8.4 - 10.5 mg/dL   GFR calc non Af Amer 89 (L) >90 mL/min   GFR calc Af Amer >90 >90 mL/min    Comment: (NOTE) The eGFR has been calculated using the CKD EPI equation. This calculation has not been validated in all clinical situations. eGFR's  persistently <90 mL/min signify possible Chronic Kidney Disease.    Anion gap 8 5 - 15  I-stat troponin, ED (not at [facility])     Status: None   Collection Time: 10/26/14 11:02 AM  Result Value Ref Range   Troponin i, poc 0.00 0.00 - 0.08 ng/mL   Comment 3            Comment: Due to the release kinetics of cTnI, a negative result within the first hours of the onset of symptoms does not rule out myocardial infarction with certainty. If myocardial infarction is still suspected, repeat the test at appropriate intervals.   I-stat troponin, ED     Status: None   Collection Time: 10/26/14  2:31 PM  Result Value Ref Range   Troponin i, poc 0.00 0.00 - 0.08 ng/mL   Comment 3            Comment: Due to the release kinetics of cTnI, a negative result within the first hours of the onset of symptoms does not rule out myocardial infarction with certainty. If myocardial infarction is still suspected, repeat the test at appropriate intervals.    Dg Chest 2 View  10/26/2014   CLINICAL DATA:  Chest pain with left upper extremity radicular symptoms. Intermittent dizziness  EXAM: CHEST  2 VIEW  COMPARISON:  Chest radiograph June 04, 2012; chest CT October 29, 2013  FINDINGS: There is no edema or consolidation. Heart size and pulmonary vascularity are normal. No adenopathy. Patient is status post coronary artery bypass grafting. No bone lesions.  IMPRESSION: No edema or consolidation.   Electronically Signed  By: Lowella Grip III M.D.   On: 10/26/2014 12:35    ROS: General:no colds or fevers, no weight changes Skin:no rashes or ulcers HEENT:no blurred vision, no congestion CV:see HPI, no palpitations, no racing HR  PUL:see HPI GI:no diarrhea constipation or melena, no indigestion GU:no hematuria, no dysuria MS:no joint pain, no claudication Neuro:no syncope, no lightheadedness, hx seizures Endo:no diabetes, no thyroid disease   Blood pressure 154/66, pulse 75, temperature 97.6 F (36.4 C),  temperature source Oral, resp. rate 18, height '5\' 9"'  (1.753 m), weight 198 lb 8 oz (90.039 kg), SpO2 100 %. PE: General:Pleasant affect, NAD Skin:Warm and dry, brisk capillary refill HEENT:normocephalic, sclera clear, mucus membranes moist Neck:supple, no JVD, no bruits  Heart:S1S2 RRR without murmur, gallup, rub or click Lungs:clear without rales, rhonchi, or wheezes TDV:VOHY, non tender, + BS, do not palpate liver spleen or masses Ext:no lower ext edema, 2+ pedal pulses, 2+ radial pulses Neuro:alert and oriented X 3, MAE, follows commands, + facial symmetry    Assessment/Plan Principal Problem:   Unstable angina, admit to tele, current troponin is neg.  With recurring episodes will follow troponin, add IV heparin and if enzymes neg probable lexiscan myoview tomorrow.  MD to see and further eval. Will check amylase as well.  Though she stated these symptoms were similar to pre MI.    Active Problems:   Coronary artery disease, post CABG 2007 last cath 2010, stable and neg nuc 2013.    Dyslipidemia- intolerant to statins, we were trying for PCSK9- though she is unsure she wants to try. as pt'ws insurance will not pay for zetia.  ot has agreed to try Livalo.    Hypothyroid- check TSH   Seizures- continue meds    [facility] R Nurse Practitioner Certified [place] Pager [phone] or after 5pm or weekends call [phone] 10/26/2014, 3:24 PM  Attending Note:   The patient was seen and examined.  Agree with assessment and plan as noted above.  Changes made to the above note as needed.  1. CAD:  Pains are not similar to her previous CP but are worrisome for angina. Will observe overnight. Plan for stress test overnight Will give her a PPI tonight  2. Dyslipidemia: continue statin   Anticipate DC tomorrow if the myoview is normal   Would anticipate cath if her Troponins are positive.   Thayer Headings, Brooke Bonito., MD, [facility] 10/26/2014, 4:45 PM [address]  [place] Pager [phone]

## 2014-10-26 NOTE — ED Notes (Signed)
MD Rees at bedside. 

## 2014-10-27 ENCOUNTER — Inpatient Hospital Stay (HOSPITAL_COMMUNITY): Payer: BLUE CROSS/BLUE SHIELD

## 2014-10-27 ENCOUNTER — Telehealth: Payer: Self-pay | Admitting: Hematology and Oncology

## 2014-10-27 ENCOUNTER — Encounter (HOSPITAL_COMMUNITY): Payer: Self-pay | Admitting: Nurse Practitioner

## 2014-10-27 DIAGNOSIS — R079 Chest pain, unspecified: Secondary | ICD-10-CM

## 2014-10-27 LAB — HEMOGLOBIN A1C
Hgb A1c MFr Bld: 5.9 % — ABNORMAL HIGH (ref 4.8–5.6)
Mean Plasma Glucose: 123 mg/dL

## 2014-10-27 LAB — BASIC METABOLIC PANEL
ANION GAP: 8 (ref 5–15)
BUN: 9 mg/dL (ref 6–23)
CALCIUM: 8.6 mg/dL (ref 8.4–10.5)
CHLORIDE: 104 mmol/L (ref 96–112)
CO2: 24 mmol/L (ref 19–32)
Creatinine, Ser: 0.56 mg/dL (ref 0.50–1.10)
GFR calc Af Amer: >90 mL/min (ref >90)
Glucose, Bld: 104 mg/dL — ABNORMAL HIGH (ref 70–99)
POTASSIUM: 3.9 mmol/L (ref 3.5–5.1)
Sodium: 136 mmol/L (ref 135–145)

## 2014-10-27 LAB — CBC
HCT: 37.6 % (ref 36.0–46.0)
Hemoglobin: 12.5 g/dL (ref 12.0–15.0)
MCH: 29.2 pg (ref 26.0–34.0)
MCHC: 33.2 g/dL (ref 30.0–36.0)
MCV: 87.9 fL (ref 78.0–100.0)
Platelets: 277 10*3/uL (ref 150–400)
RBC: 4.28 MIL/uL (ref 3.87–5.11)
RDW: 13.2 % (ref 11.5–15.5)
WBC: 8.9 10*3/uL (ref 4.0–10.5)

## 2014-10-27 LAB — T4, FREE: FREE T4: 0.84 ng/dL (ref 0.80–1.80)

## 2014-10-27 LAB — TROPONIN I: Troponin I: <0.03 ng/mL (ref <0.031)

## 2014-10-27 MED ORDER — TECHNETIUM TC 99M SESTAMIBI GENERIC - CARDIOLITE
10.0000 | Freq: Once | INTRAVENOUS | Status: AC | PRN
  Administered 2014-10-27: 10 via INTRAVENOUS

## 2014-10-27 MED ORDER — HEPARIN BOLUS VIA INFUSION
3000.0000 [IU] | Freq: Once | INTRAVENOUS | Status: AC
  Administered 2014-10-27: 3000 [IU] via INTRAVENOUS
  Filled 2014-10-27: qty 3000

## 2014-10-27 MED ORDER — REGADENOSON 0.4 MG/5ML IV SOLN
0.4000 mg | Freq: Once | INTRAVENOUS | Status: DC
  Filled 2014-10-27: qty 5

## 2014-10-27 MED ORDER — TECHNETIUM TC 99M SESTAMIBI GENERIC - CARDIOLITE
30.0000 | Freq: Once | INTRAVENOUS | Status: AC | PRN
  Administered 2014-10-27: 30 via INTRAVENOUS

## 2014-10-27 MED ORDER — REGADENOSON 0.4 MG/5ML IV SOLN
INTRAVENOUS | Status: AC
  Administered 2014-10-27: 0.4 mg via INTRAVENOUS
  Filled 2014-10-27: qty 5

## 2014-10-27 NOTE — Progress Notes (Signed)
ANTICOAGULATION CONSULT NOTE - Follow Up Consult  Pharmacy Consult for heparin Indication: USAP    Labs:  Recent Labs  10/26/14 1050 10/26/14 1640 10/26/14 2204  HGB 13.7  --   --   HCT 40.1  --   --   PLT 308  --   --   LABPROT  --  14.0  --   INR  --  1.07  --   HEPARINUNFRC  --   --  0.19*  CREATININE 0.70  --   --   TROPONINI  --  <0.03 <0.03      Assessment: 63yo female subtherapeutic on heparin with initial dosing for USAP.  Goal of Therapy:  Heparin level 0.3-0.7 units/ml   Plan:  Will rebolus with heparin 3000 units and increase gtt by 4 units/kg/hr to 1400 units/hr and check level in 6hr.  Wynona Neat, PharmD, BCPS  10/27/2014,12:25 AM

## 2014-10-27 NOTE — Progress Notes (Signed)
Pt discharged to home with husband . Rn did in depth discharge instruction. Explained her meds routine. Pt vitals stable.

## 2014-10-27 NOTE — Progress Notes (Signed)
   Rachel Clark presented for a lexiscan cardiolite today.  No immediate complications.  Stress imaging pending.  Murray Hodgkins, NP 10/27/2014, 12:01 PM

## 2014-10-27 NOTE — Progress Notes (Signed)
UR Completed. Millissa Deese, RN, BSN.  [phone] 

## 2014-10-27 NOTE — Progress Notes (Signed)
ANTICOAGULATION CONSULT NOTE - Follow Up Consult  Pharmacy Consult for heparin Indication: USAP    Labs:  Recent Labs  10/26/14 1050 10/26/14 1640 10/26/14 2204 10/27/14 0400  HGB 13.7  --   --  12.5  HCT 40.1  --   --  37.6  PLT 308  --   --  277  LABPROT  --  14.0  --   --   INR  --  1.07  --   --   HEPARINUNFRC  --   --  0.19*  --   CREATININE 0.70  --   --  0.56  TROPONINI  --  <0.03 <0.03 <0.03      Assessment: 64yo female with subtherapeutic HL last night at 2200.  A heparin bolus of 3000 units and rate increase to 1400 units/hr was done at 0030am.  The HL ordered for 0700 am was not drawn by lab (lab was called, they are behind)  Pt is now s/p stress test and heparin and NTG to be stopped if stress test negative.  Goal of Therapy:  Heparin level 0.3-0.7 units/ml   Plan:  Continue heparin drip at 1400 units/hr for now and f/u results of stress test -if heparin to be continued, will order another HL  Eudelia Bunch, Pharm.D. [phone] 10/27/2014 1:51 PM

## 2014-10-27 NOTE — Progress Notes (Signed)
    Subjective:  No chest pain overnight. No shortness of breath.  Objective:  Vital Signs in the last 24 hours: Temp:  [97.4 F (36.3 C)-98 F (36.7 C)] 98 F (36.7 C) (04/26 0816) Pulse Rate:  [60-75] 65 (04/26 0816) Resp:  [12-20] 20 (04/26 0801) BP: (119-160)/(52-77) 119/56 mmHg (04/26 0816) SpO2:  [96 %-100 %] 97 % (04/26 0816) Weight:  [198 lb 3.2 oz (89.903 kg)-198 lb 8 oz (90.039 kg)] 198 lb 3.2 oz (89.903 kg) (04/26 0500)  Intake/Output from previous day: 04/25 0701 - 04/26 0700 In: -  Out: 1150 [Urine:1150]  Physical Exam: Pt is alert and oriented, pleasant overweight woman in NAD HEENT: normal Neck: JVP - normal Lungs: CTA bilaterally CV: RRR without murmur or gallop Abd: soft, NT, Positive BS, no hepatomegaly Ext: no C/C/E, distal pulses intact and equal Skin: warm/dry no rash   Lab Results:  Recent Labs  10/26/14 1050 10/27/14 0400  WBC 8.3 8.9  HGB 13.7 12.5  PLT 308 277    Recent Labs  10/26/14 1050 10/27/14 0400  NA 137 136  K 4.2 3.9  CL 102 104  CO2 27 24  GLUCOSE 133* 104*  BUN 11 9  CREATININE 0.70 0.56    Recent Labs  10/26/14 2204 10/27/14 0400  TROPONINI <0.03 <0.03    Cardiac Studies: Myoview scan pending  Tele: Personally reviewed: Normal sinus rhythm without significant arrhythmia  Assessment/Plan:  1. Chest pain in patient with high risk for unstable angina pectoris. The patient's symptoms have resolved on intravenous heparin and nitroglycerin. She is scheduled for a Lexiscan Myoview stress test this morning. Initial cardiac markers and EKGs without evidence of infarction.  2. CAD status post CABG: As above, for stress test this morning.  3. Hyperlipidemia: The patient is on a statin drug. She will continue on her home therapy.  Disposition: Pending stress test today. If negative, will stop heparin and nitroglycerin and would anticipate hospital discharge this afternoon. If positive, will need cardiac  catheterization.  Sherren Mocha, M.D. 10/27/2014, 9:39 AM

## 2014-10-27 NOTE — Care Management Note (Unsigned)
    Page 1 of 1   10/27/2014     3:16:50 PM CARE MANAGEMENT NOTE 10/27/2014  Patient:  Rachel Clark,Rachel Clark   Account Number:  [id number]  Date Initiated:  10/27/2014  Documentation initiated by:  Elenor Quinones  Subjective/Objective Assessment:   Pt admitted with unstable angina, troponins are negative. Episodes include dizziness, left arm pain and heat radiation     Action/Plan:   Pt to have nuclear med study today.  CM will continue to monitor for disposition needs   Anticipated DC Date:  10/27/2014   Anticipated DC Plan:  [place]  CM consult      Choice offered to / List presented to:             Status of service:  In process, will continue to follow Medicare Important Message given?  NO (If response is "NO", the following Medicare IM given date fields will be blank) Date Medicare IM given:   Medicare IM given by:   Date Additional Medicare IM given:   Additional Medicare IM given by:    Discharge Disposition:    Per UR Regulation:  Reviewed for med. necessity/level of care/duration of stay  If discussed at [employer] of Stay Meetings, dates discussed:    Comments:

## 2014-10-27 NOTE — Telephone Encounter (Signed)
returned pt husband call and confirmed appts.Marland KitchenMarland Kitchen

## 2014-10-27 NOTE — Discharge Summary (Signed)
Discharge Summary   Patient ID: Rachel Clark,  MRN: [id number], DOB/AGE: 07-20-1950 64 y.o.  Admit date: 10/26/2014 Discharge date: 10/27/2014  Primary Care Provider: Vidal Schwalbe Primary Cardiologist: P. [name], MD   Discharge Diagnoses Principal Problem:   Unstable angina  **Negative Lexiscan Cardioilte this admission   Active Problems:   Coronary artery disease, post CABG 2007    Dyslipidemia   Hypothyroid   Seizures  Allergies Allergies  Allergen Reactions  . Aspirin Other (See Comments)    GI upset  . Erythromycin     Gi upset  . Imdur [Isosorbide Mononitrate]     Pt does not recall reaction  . Lisinopril     cough  . Niacin And Related     Pt does not recall reactions   . Penicillins     Gi upset  . Statins     Muscle pain  . Sulfa Drugs Cross Reactors Swelling  . Tetracyclines & Related     Procedures  Lexiscan Cardiolite 4.26.2016  IMPRESSION: Normal stress nuclear study with no chest pain, no ST changes and normal perfusion with no ischemia or infarction. The gated ejection fraction was 61% and the wall motion was normal. _____________   History of Present Illness  64 y/o female with a h/o CAD s/p CABG in 2007, HL (statin intolerant), seizure disorder, and colon cancer.  She was in her usual state of health until 3 days prior to admission when she began to experience intermittent chest, abdominal, and throat discomfort with associated dyspnea and lightheadedness, occurring in "waves" and lasting just a few seconds at a time.  On 4/25, she had more intense chest heaviness, prompting her to present to [facility] ED for evaluation.  There, ECG was non-acute and troponin was normal.  She was admitted for further evaluation.  Hospital Course  Patient ruled out for MI.  She did not have any further chest pain or flushing while on heparin and IV ntg.  She underwent lexiscan cardiolite stress testing, which showed no evidence of ischemia with normal  LV function.  She will be discharged home today in good condition.  Discharge Vitals Blood pressure 147/64, pulse 64, temperature 97.8 F (36.6 C), temperature source Oral, resp. rate 20, height 5\' 9"  (1.753 m), weight 198 lb 3.2 oz (89.903 kg), SpO2 100 %.  Filed Weights   10/26/14 1054 10/27/14 0500  Weight: 198 lb 8 oz (90.039 kg) 198 lb 3.2 oz (89.903 kg)    Labs  CBC  Recent Labs  10/26/14 1050 10/27/14 0400  WBC 8.3 8.9  HGB 13.7 12.5  HCT 40.1 37.6  MCV 88.1 87.9  PLT 308 390   Basic Metabolic Panel  Recent Labs  10/26/14 1050 10/26/14 1640 10/27/14 0400  NA 137  --  136  K 4.2  --  3.9  CL 102  --  104  CO2 27  --  24  GLUCOSE 133*  --  104*  BUN 11  --  9  CREATININE 0.70  --  0.56  CALCIUM 8.8  --  8.6  MG  --  2.2  --    Liver Function Tests  Recent Labs  10/26/14 1640  AST 14  ALT 14  ALKPHOS 79  BILITOT 0.5  PROT 7.0  ALBUMIN 3.6    Recent Labs  10/26/14 1640  LIPASE 23  AMYLASE 47   Cardiac Enzymes  Recent Labs  10/26/14 1640 10/26/14 2204 10/27/14 0400  TROPONINI <0.03 <0.03 <0.03  Hemoglobin A1C  Recent Labs  10/26/14 1640  HGBA1C 5.9*   Thyroid Function Tests  Recent Labs  10/26/14 1640  TSH 0.822   Disposition  Pt is being discharged home today in good condition.  Follow-up Plans & Appointments      Follow-up Information    Follow up with Peter [name], MD On 01/14/2015.   Specialty:  Cardiology   Why:  3:00 PM   Contact information:   [address] [place] [address] [phone]       Follow up with Vidal Schwalbe, MD.   Specialty:  Family Medicine   Why:  as scheduled.   Contact information:   [place] [address] [phone]      Discharge Medications    Medication List    TAKE these medications        carbamazepine 100 MG 12 hr tablet  Commonly known as:  TEGRETOL-XR  TAKE 5 TABLETS BY MOUTH TWICE A DAY     diphenhydrAMINE 25 MG  tablet  Commonly known as:  BENADRYL  Take 25 mg by mouth every 6 (six) hours as needed. For allergies.     DULoxetine 60 MG capsule  Commonly known as:  CYMBALTA  Take 60 mg by mouth every morning.     estradiol 0.075 MG/24HR  Commonly known as:  VIVELLE-DOT  Place 1 patch onto the skin 2 (two) times a week. On Sundays and Wednesdays.     ethosuximide 250 MG capsule  Commonly known as:  ZARONTIN  Taking 1 in am, 2 in the afternoon, and 2 at night     ezetimibe 10 MG tablet  Commonly known as:  ZETIA  Take 1 tablet (10 mg total) by mouth daily.     levothyroxine 50 MCG tablet  Commonly known as:  SYNTHROID, LEVOTHROID  Take 50 mcg by mouth daily.     loratadine 10 MG tablet  Commonly known as:  CLARITIN  Take 10 mg by mouth daily as needed for allergies.     nitroGLYCERIN 0.4 MG SL tablet  Commonly known as:  NITROSTAT  Place 1 tablet (0.4 mg total) under the tongue every 5 (five) minutes as needed. For chest pain.     Vitamin D-3 1000 UNITS Caps  Take 1,000 Units by mouth daily.       Outstanding Labs/Studies  None  Duration of Discharge Encounter   Greater than 30 minutes including physician time.  Signed, Murray Hodgkins NP 10/27/2014, 4:36 PM

## 2014-10-27 NOTE — Discharge Instructions (Signed)
**  PLEASE REMEMBER TO BRING ALL OF YOUR MEDICATIONS TO EACH OF YOUR FOLLOW-UP OFFICE VISITS.   Angina Pectoris Angina pectoris is extreme discomfort in your chest, neck, or arm. Your doctor may call it just angina. It is caused by a lack of oxygen to your heart wall. It may feel like tightness or heavy pressure. It may feel like a crushing or squeezing pain. Some people say it feels like gas. It may go down your shoulders, back, and arms. Some people have symptoms other than pain. These include:  Tiredness.  Shortness of breath.  Cold sweats.  Feeling sick to your stomach (nausea). There are four types of angina:  Stable angina. This type often lasts the same amount of time each time it happens. Activity, stress, or excitement can bring it on. It often gets better after taking a medicine called nitroglycerin. This goes under your tongue.  Unstable angina. This type can happen when you are not active or even during sleep. It can suddenly get worse or happen more often. It may not get better after taking the special medicine. It can last up to 30 minutes.  Microvascular angina. This type is more common in women. It may be more severe or last longer than other types.  Prinzmetal angina. This type often happens when you are not active or in the early morning hours. HOME CARE   Only take medicines as told by your doctor.  Stay active or exercise more as told by your doctor.  Limit very hard activity as told by your doctor.  Limit heavy lifting as told by your doctor.  Keep a healthy weight.  Learn about and eat foods that are healthy for your heart.  Do not use any tobacco such as cigarettes, chewing tobacco, or e-cigarettes. GET HELP RIGHT AWAY IF:   You have chest, neck, deep shoulder, or arm pain or discomfort that lasts more than a few minutes.  You have chest, neck, deep shoulder, or arm pain or discomfort that goes away and comes back over and over again.  You have heavy  sweating that seems to happen for no reason.  You have shortness of breath or trouble breathing.  Your angina does not get better after a few minutes of rest.  Your angina does not get better after you take nitroglycerin medicine. These can all be symptoms of a heart attack. Get help right away. Call your local emergency service (911 in [place] Do not  drive yourself to the hospital. Do not  wait to for your symptoms to go away. MAKE SURE YOU:   Understand these instructions.  Will watch your condition.  Will get help right away if you are not doing well or get worse. Document Released: 12/06/2007 Document Revised: 06/24/2013 Document Reviewed: 10/21/2013 [facility] Patient Information 2015 [place], [place]. This information is not intended to replace advice given to you by your health care provider. Make sure you discuss any questions you have with your health care provider.

## 2014-10-28 NOTE — Telephone Encounter (Signed)
Pt. Called , VM came on . Pt. Informed via voice mail to see her PCP and if he thinks we need to see to let us know

## 2014-11-02 ENCOUNTER — Telehealth: Payer: Self-pay | Admitting: Hematology and Oncology

## 2014-11-02 NOTE — Telephone Encounter (Signed)
Returned patient call re lab appointment but was not able to reach patient (*3). Phone line rings but then drops and rings busy. Patient only stated she was calling re her lab appointment - no other info.

## 2014-11-03 ENCOUNTER — Ambulatory Visit (HOSPITAL_COMMUNITY)
Admission: RE | Admit: 2014-11-03 | Discharge: 2014-11-03 | Disposition: A | Discharge status: Home / Self Care | Payer: BLUE CROSS/BLUE SHIELD | Source: Ambulatory Visit | Attending: Hematology and Oncology | Admitting: Hematology and Oncology

## 2014-11-03 ENCOUNTER — Other Ambulatory Visit: Payer: BLUE CROSS/BLUE SHIELD | Admitting: Hematology and Oncology

## 2014-11-03 ENCOUNTER — Other Ambulatory Visit (HOSPITAL_BASED_OUTPATIENT_CLINIC_OR_DEPARTMENT_OTHER): Payer: BLUE CROSS/BLUE SHIELD

## 2014-11-03 DIAGNOSIS — C189 Malignant neoplasm of colon, unspecified: Secondary | ICD-10-CM

## 2014-11-03 DIAGNOSIS — D472 Monoclonal gammopathy: Secondary | ICD-10-CM

## 2014-11-03 DIAGNOSIS — C184 Malignant neoplasm of transverse colon: Secondary | ICD-10-CM | POA: Diagnosis not present

## 2014-11-03 LAB — COMPREHENSIVE METABOLIC PANEL (CC13)
ALK PHOS: 103 U/L (ref 40–150)
ALT: 18 U/L (ref 0–55)
ANION GAP: 9 meq/L (ref 3–11)
AST: 14 U/L (ref 5–34)
Albumin: 3.9 g/dL (ref 3.5–5.0)
BILIRUBIN TOTAL: 0.25 mg/dL (ref 0.20–1.20)
BUN: 15.5 mg/dL (ref 7.0–26.0)
CO2: 25 mEq/L (ref 22–29)
CREATININE: 0.7 mg/dL (ref 0.6–1.1)
Calcium: 9.3 mg/dL (ref 8.4–10.4)
Chloride: 100 mEq/L (ref 98–109)
Glucose: 106 mg/dl (ref 70–140)
Potassium: 4.3 mEq/L (ref 3.5–5.1)
SODIUM: 134 meq/L — AB (ref 136–145)
Total Protein: 7.7 g/dL (ref 6.4–8.3)

## 2014-11-03 LAB — CBC WITH DIFFERENTIAL/PLATELET
BASO%: 0.5 % (ref 0.0–2.0)
BASOS ABS: 0 10*3/uL (ref 0.0–0.1)
EOS ABS: 0.3 10*3/uL (ref 0.0–0.5)
EOS%: 4 % (ref 0.0–7.0)
HEMATOCRIT: 41.5 % (ref 34.8–46.6)
HGB: 13.5 g/dL (ref 11.6–15.9)
LYMPH%: 31.9 % (ref 14.0–49.7)
MCH: 28.8 pg (ref 25.1–34.0)
MCHC: 32.6 g/dL (ref 31.5–36.0)
MCV: 88.3 fL (ref 79.5–101.0)
MONO#: 0.6 10*3/uL (ref 0.1–0.9)
MONO%: 7.3 % (ref 0.0–14.0)
NEUT#: 4.9 10*3/uL (ref 1.5–6.5)
NEUT%: 56.3 % (ref 38.4–76.8)
Platelets: 310 10*3/uL (ref 145–400)
RBC: 4.7 10*6/uL (ref 3.70–5.45)
RDW: 13.2 % (ref 11.2–14.5)
WBC: 8.7 10*3/uL (ref 3.9–10.3)
lymph#: 2.8 10*3/uL (ref 0.9–3.3)

## 2014-11-03 MED ORDER — IOHEXOL 300 MG/ML  SOLN: 100.0000 mL | Freq: Once | INTRAMUSCULAR | Status: AC | PRN

## 2014-11-03 MED ORDER — IOHEXOL 300 MG/ML  SOLN
50.0000 mL | Freq: Once | INTRAMUSCULAR | Status: AC | PRN
  Administered 2014-11-03: 50 mL via ORAL

## 2014-11-05 LAB — SPEP & IFE WITH QIG
ABNORMAL PROTEIN BAND2: 0.5 g/dL
ALBUMIN ELP: 4.2 g/dL (ref 3.8–4.8)
ALPHA-1-GLOBULIN: 0.4 g/dL — AB (ref 0.2–0.3)
Abnormal Protein Band1: 0.2 g/dL
Abnormal Protein Band3: NOT DETECTED g/dL
Alpha-2-Globulin: 0.9 g/dL (ref 0.5–0.9)
Beta 2: 0.4 g/dL (ref 0.2–0.5)
Beta Globulin: 0.4 g/dL (ref 0.4–0.6)
Gamma Globulin: 1.3 g/dL (ref 0.8–1.7)
IgA: 187 mg/dL (ref 69–380)
IgG (Immunoglobin G), Serum: 1500 mg/dL (ref 690–1700)
IgM, Serum: 104 mg/dL (ref 52–322)
Total Protein, Serum Electrophoresis: 7.5 g/dL (ref 6.1–8.1)

## 2014-11-05 LAB — KAPPA/LAMBDA LIGHT CHAINS
KAPPA FREE LGHT CHN: 2.62 mg/dL — AB (ref 0.33–1.94)
Kappa:Lambda Ratio: 1.47 (ref 0.26–1.65)
Lambda Free Lght Chn: 1.78 mg/dL (ref 0.57–2.63)

## 2014-11-05 LAB — CEA: CEA: 1.5 ng/mL (ref 0.0–5.0)

## 2014-11-09 ENCOUNTER — Telehealth: Payer: Self-pay

## 2014-11-09 NOTE — Telephone Encounter (Signed)
Answering pt call from [phone]. Pt stated she thought she missed her lab appt last week. Her son was being rushed to the hospital and her aunt died that same day. Pt did have her labs drawn and got her CXR and her CT test done. Her son is OK.

## 2014-11-10 ENCOUNTER — Ambulatory Visit: Payer: BC Managed Care – PPO | Admitting: Hematology and Oncology

## 2014-11-17 ENCOUNTER — Encounter: Payer: Self-pay | Admitting: Hematology and Oncology

## 2014-11-17 ENCOUNTER — Ambulatory Visit (HOSPITAL_BASED_OUTPATIENT_CLINIC_OR_DEPARTMENT_OTHER): Payer: BLUE CROSS/BLUE SHIELD | Admitting: Hematology and Oncology

## 2014-11-17 ENCOUNTER — Telehealth: Payer: Self-pay | Admitting: Hematology and Oncology

## 2014-11-17 VITALS — BP 140/74 | HR 86 | Temp 97.7°F | Resp 18 | Ht 69.0 in | Wt 196.5 lb

## 2014-11-17 DIAGNOSIS — D472 Monoclonal gammopathy: Secondary | ICD-10-CM

## 2014-11-17 DIAGNOSIS — C189 Malignant neoplasm of colon, unspecified: Secondary | ICD-10-CM

## 2014-11-17 DIAGNOSIS — Z85038 Personal history of other malignant neoplasm of large intestine: Secondary | ICD-10-CM | POA: Diagnosis not present

## 2014-11-17 NOTE — Progress Notes (Signed)
[facility] OFFICE PROGRESS NOTE  Patient Care Team: Harlan Stains, MD as PCP - General (Family Medicine)  SUMMARY OF ONCOLOGIC HISTORY:   Colon cancer   12/04/2011 Initial Diagnosis Colon cancer   11/03/2014 Imaging CT scan showed no evidence of cancer recurrence.   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 The patient have very minimal M spike and asymptomatic. #2 T3, N0, M0 colon cancer This was discovered when the patient presented with severe anemia. Colonoscopy revealed abnormal lesion and in 12/19/2011 Dr. Donne Hazel perform laparoscopic-assisted transverse colectomy. She received adjuvant Xeloda in August 2013 and switched to IV 5-FU in December 2013 due to poor tolerance to Xeloda. Her last chemotherapy is completed by March 2014. She has normal colonoscopy in July 2014. CT scan from 10/29/2013 show no evidence of recurrence. INTERVAL HISTORY: Please see below for problem oriented charting. She denies altered bowel habits. Denies any recent infection. She has chronic musculoskeletal pain due to fibromyalgia. She is taking vitamin D supplementation.  REVIEW OF SYSTEMS:   Constitutional: Denies fevers, chills or abnormal weight loss Eyes: Denies blurriness of vision Ears, nose, mouth, throat, and face: Denies mucositis or sore throat Respiratory: Denies cough, dyspnea or wheezes Cardiovascular: Denies palpitation, chest discomfort or lower extremity swelling Gastrointestinal:  Denies nausea, heartburn or change in bowel habits Skin: Denies abnormal skin rashes Lymphatics: Denies new lymphadenopathy or easy bruising Neurological:Denies numbness, tingling or new weaknesses Behavioral/Psych: Mood is stable, no new changes  All other systems were reviewed with the patient and are negative.  I have reviewed  the past medical history, past surgical history, social history and family history with the patient and they are unchanged from previous note.  ALLERGIES:  is allergic to aspirin; erythromycin; imdur; lisinopril; niacin and related; penicillins; statins; sulfa drugs cross reactors; and tetracyclines & related.  MEDICATIONS:  Current Outpatient Prescriptions  Medication Sig Dispense Refill  . carbamazepine (TEGRETOL-XR) 100 MG 12 hr tablet TAKE 5 TABLETS BY MOUTH TWICE A DAY 300 tablet 5  . Cholecalciferol (VITAMIN D-3) 1000 UNITS CAPS Take 1,000 Units by mouth daily.    . diphenhydrAMINE (BENADRYL) 25 MG tablet Take 25 mg by mouth every 6 (six) hours as needed. For allergies.    . DULoxetine (CYMBALTA) 60 MG capsule Take 60 mg by mouth every morning.    Marland Kitchen estradiol (VIVELLE-DOT) 0.075 MG/24HR Place 1 patch onto the skin 2 (two) times a week. On Sundays and Wednesdays.    Marland Kitchen ethosuximide (ZARONTIN) 250 MG capsule Taking 1 in am, 2 in the afternoon, and 2 at night 150 capsule 5  . ezetimibe (ZETIA) 10 MG tablet Take 1 tablet (10 mg total) by mouth daily. 30 tablet 3  . levothyroxine (SYNTHROID, LEVOTHROID) 50 MCG tablet Take 50 mcg by mouth daily.     Marland Kitchen loratadine (CLARITIN) 10 MG tablet Take 10 mg by mouth daily as needed for allergies.    . nitroGLYCERIN (NITROSTAT) 0.4 MG SL tablet Place 1 tablet (0.4 mg total) under the tongue every 5 (five) minutes as needed. For chest pain. 25 tablet 1   No current facility-administered medications for this visit.    PHYSICAL EXAMINATION: ECOG PERFORMANCE STATUS: 0 - Asymptomatic  Filed Vitals:   11/17/14 0908  BP: 140/74  Pulse: 86  Temp: 97.7 F (36.5 C)  Resp: 18   Filed Weights  11/17/14 0908  Weight: 196 lb 8 oz (89.132 kg)    GENERAL:alert, no distress and comfortable SKIN: skin color, texture, turgor are normal, no rashes or significant lesions EYES: normal, Conjunctiva are pink and non-injected, sclera clear OROPHARYNX:no exudate,  no erythema and lips, buccal mucosa, and tongue normal  NECK: supple, thyroid normal size, non-tender, without nodularity LYMPH:  no palpable lymphadenopathy in the cervical, axillary or inguinal LUNGS: clear to auscultation and percussion with normal breathing effort HEART: regular rate & rhythm and no murmurs and no lower extremity edema ABDOMEN:abdomen soft, non-tender and normal bowel sounds Musculoskeletal:no cyanosis of digits and no clubbing  NEURO: alert & oriented x 3 with fluent speech, no focal motor/sensory deficits  LABORATORY DATA:  I have reviewed the data as listed    Component Value Date/Time   NA 134* 11/03/2014 1415   NA 136 10/27/2014 0400   NA 136 02/19/2014 1043   K 4.3 11/03/2014 1415   K 3.9 10/27/2014 0400   CL 104 10/27/2014 0400   CL 106 12/25/2012 0924   CO2 25 11/03/2014 1415   CO2 24 10/27/2014 0400   GLUCOSE 106 11/03/2014 1415   GLUCOSE 104* 10/27/2014 0400   GLUCOSE 93 02/19/2014 1043   GLUCOSE 105* 12/25/2012 0924   BUN 15.5 11/03/2014 1415   BUN 9 10/27/2014 0400   BUN 17 02/19/2014 1043   CREATININE 0.7 11/03/2014 1415   CREATININE 0.56 10/27/2014 0400   CALCIUM 9.3 11/03/2014 1415   CALCIUM 8.6 10/27/2014 0400   PROT 7.7 11/03/2014 1415   PROT 7.0 10/26/2014 1640   PROT 7.4 02/19/2014 1043   ALBUMIN 3.9 11/03/2014 1415   ALBUMIN 3.6 10/26/2014 1640   AST 14 11/03/2014 1415   AST 14 10/26/2014 1640   ALT 18 11/03/2014 1415   ALT 14 10/26/2014 1640   ALKPHOS 103 11/03/2014 1415   ALKPHOS 79 10/26/2014 1640   BILITOT 0.25 11/03/2014 1415   BILITOT 0.5 10/26/2014 1640   GFRNONAA >90 10/27/2014 0400   GFRAA >90 10/27/2014 0400    No results found for: SPEP, UPEP  Lab Results  Component Value Date   WBC 8.7 11/03/2014   NEUTROABS 4.9 11/03/2014   HGB 13.5 11/03/2014   HCT 41.5 11/03/2014   MCV 88.3 11/03/2014   PLT 310 11/03/2014      Chemistry      Component Value Date/Time   NA 134* 11/03/2014 1415   NA 136 10/27/2014  0400   NA 136 02/19/2014 1043   K 4.3 11/03/2014 1415   K 3.9 10/27/2014 0400   CL 104 10/27/2014 0400   CL 106 12/25/2012 0924   CO2 25 11/03/2014 1415   CO2 24 10/27/2014 0400   BUN 15.5 11/03/2014 1415   BUN 9 10/27/2014 0400   BUN 17 02/19/2014 1043   CREATININE 0.7 11/03/2014 1415   CREATININE 0.56 10/27/2014 0400      Component Value Date/Time   CALCIUM 9.3 11/03/2014 1415   CALCIUM 8.6 10/27/2014 0400   ALKPHOS 103 11/03/2014 1415   ALKPHOS 79 10/26/2014 1640   AST 14 11/03/2014 1415   AST 14 10/26/2014 1640   ALT 18 11/03/2014 1415   ALT 14 10/26/2014 1640   BILITOT 0.25 11/03/2014 1415   BILITOT 0.5 10/26/2014 1640       RADIOGRAPHIC STUDIES: I have reviewed skeletal survey and CT scan I have personally reviewed the radiological images as listed and agreed with the findings in the report.   ASSESSMENT & PLAN:  Colon cancer I will try to obtain the formal colonoscopy report from 2014 to see if any polyps were found. CT scan of the abdomen and pelvis and CEA were within normal limits. The patient appears to be cancer free. Per guidelines, I will continue tumor marker and imaging study monitoring next year   Monoclonal gammopathy of unknown significance She has no signs of disease progression on blood work, physical examination and imaging study. Continue yearly visit with repeat blood work and imaging study as needed    Orders Placed This Encounter  Procedures  . CT Abdomen Pelvis W Contrast    Standing Status: Future     Number of Occurrences:      Standing Expiration Date: 02/17/2016    Order Specific Question:  Reason for Exam (SYMPTOM  OR DIAGNOSIS REQUIRED)    Answer:  staging colon ca, exclude recurrence    Order Specific Question:  Preferred imaging location?    Answer:  [facility]  . CT Chest W Contrast    Standing Status: Future     Number of Occurrences:      Standing Expiration Date: 02/17/2016    Order Specific Question:  Reason  for Exam (SYMPTOM  OR DIAGNOSIS REQUIRED)    Answer:  staging colon ca, exclude recurrence    Order Specific Question:  Preferred imaging location?    Answer:  [facility]  . Comprehensive metabolic panel    Standing Status: Future     Number of Occurrences:      Standing Expiration Date: 02/17/2016  . CBC with Differential/Platelet    Standing Status: Future     Number of Occurrences:      Standing Expiration Date: 02/17/2016  . Lactate dehydrogenase    Standing Status: Future     Number of Occurrences:      Standing Expiration Date: 02/17/2016  . CEA    Standing Status: Future     Number of Occurrences:      Standing Expiration Date: 02/17/2016  . SPEP & IFE with QIG    Standing Status: Future     Number of Occurrences:      Standing Expiration Date: 02/17/2016  . Kappa/lambda light chains    Standing Status: Future     Number of Occurrences:      Standing Expiration Date: 02/17/2016  . Beta 2 microglobulin, serum    Standing Status: Future     Number of Occurrences:      Standing Expiration Date: 02/17/2016   All questions were answered. The patient knows to call the clinic with any problems, questions or concerns. No barriers to learning was detected. I spent 25 minutes counseling the patient face to face. The total time spent in the appointment was 30 minutes and more than 50% was on counseling and review of test results     [facility], [place], MD 11/17/2014 9:37 AM

## 2014-11-17 NOTE — Assessment & Plan Note (Signed)
I will try to obtain the formal colonoscopy report from 2014 to see if any polyps were found. CT scan of the abdomen and pelvis and CEA were within normal limits. The patient appears to be cancer free. Per guidelines, I will continue tumor marker and imaging study monitoring next year

## 2014-11-17 NOTE — Assessment & Plan Note (Signed)
She has no signs of disease progression on blood work, physical examination and imaging study. Continue yearly visit with repeat blood work and imaging study as needed

## 2014-11-17 NOTE — Telephone Encounter (Signed)
Pt confirmed labs/ov per 05/17 POF, gave pt AVS and Calendar.... KJ, gave pt barium

## 2015-01-03 ENCOUNTER — Other Ambulatory Visit: Payer: Self-pay | Admitting: Neurology

## 2015-01-12 ENCOUNTER — Other Ambulatory Visit (INDEPENDENT_AMBULATORY_CARE_PROVIDER_SITE_OTHER): Payer: BLUE CROSS/BLUE SHIELD | Admitting: *Deleted

## 2015-01-12 DIAGNOSIS — E785 Hyperlipidemia, unspecified: Secondary | ICD-10-CM | POA: Diagnosis not present

## 2015-01-12 LAB — LIPID PANEL
CHOL/HDL RATIO: 4
CHOLESTEROL: 256 mg/dL — AB (ref 0–200)
HDL: 71.3 mg/dL (ref >39.00)
LDL CALC: 163 mg/dL — AB (ref 0–99)
NONHDL: 184.7
Triglycerides: 111 mg/dL (ref 0.0–149.0)
VLDL: 22.2 mg/dL (ref 0.0–40.0)

## 2015-01-12 LAB — HEPATIC FUNCTION PANEL
ALK PHOS: 85 U/L (ref 39–117)
ALT: 13 U/L (ref 0–35)
AST: 11 U/L (ref 0–37)
Albumin: 3.8 g/dL (ref 3.5–5.2)
Bilirubin, Direct: 0.1 mg/dL (ref 0.0–0.3)
Total Bilirubin: 0.3 mg/dL (ref 0.2–1.2)
Total Protein: 7.3 g/dL (ref 6.0–8.3)

## 2015-01-12 NOTE — Addendum Note (Signed)
Addended by: Eulis Foster on: 01/12/2015 08:00 AM   Modules accepted: Orders

## 2015-01-14 ENCOUNTER — Encounter: Payer: Self-pay | Admitting: Cardiology

## 2015-01-14 ENCOUNTER — Ambulatory Visit (INDEPENDENT_AMBULATORY_CARE_PROVIDER_SITE_OTHER): Payer: BLUE CROSS/BLUE SHIELD | Admitting: Cardiology

## 2015-01-14 VITALS — BP 122/82 | HR 90 | Ht 68.0 in | Wt 194.0 lb

## 2015-01-14 DIAGNOSIS — I25810 Atherosclerosis of coronary artery bypass graft(s) without angina pectoris: Secondary | ICD-10-CM

## 2015-01-14 DIAGNOSIS — E785 Hyperlipidemia, unspecified: Secondary | ICD-10-CM | POA: Diagnosis not present

## 2015-01-14 NOTE — Patient Instructions (Addendum)
Continue your current therapy  I will see you in one year   

## 2015-01-14 NOTE — Progress Notes (Signed)
Cardiology Office Note   Date:  01/14/2015   ID:  Rachel Clark, DOB 1950/12/19, MRN [id number]  PCP:  Vidal Schwalbe, MD  Cardiologist:  Dr. Jaena Brocato [name]     History of Present Illness: Rachel Clark is a 64 y.o. female with a hx of CAD s/p CABG in 2007, ASA intolerance due to GI upset, HL with statin intolerance, colon CA s/p resection and chemotherapy, MGUS.   Admitted in 08/2011 with chest pain.  MI was ruled out and Myoview was negative for ischemia.    She returns for FU.  She is doing very well.  She denies chest pain, shortness of breath, syncope, orthopnea, PND or edema.  She was seen in lipid clinic and given Livalo. She took it one day and didn't feel right so she stopped it. May try it again. She does have occasional sensation of feeling hot all over like she opened a furnace. No palpitations or other symptoms.    Studies:   - LHC (11/10):  2 v CAD with L-LAD and S-RCA patent, small caliber Dx 70% (treated medically - no amenable to PCI), CFX free of significant disease, normal LVF  - Nuclear (2/13):  No scar or ischemia, EF 59%   Recent Labs: 10/26/2014: TSH 0.822 11/03/2014: BUN 15.5; Creatinine 0.7; HGB 13.5; Potassium 4.3; Sodium 134* 01/12/2015: ALT 13; LDL Cholesterol 163*   CrCl cannot be calculated (Patient has no serum creatinine result on file.).    Recent Radiology: No results found.    Wt Readings from Last 3 Encounters:  01/14/15 87.998 kg (194 lb)  11/17/14 89.132 kg (196 lb 8 oz)  10/27/14 89.903 kg (198 lb 3.2 oz)     Past Medical History  Diagnosis Date  . Coronary artery disease     a. Has had remote NSTEMI with PCI to the RCA; b. S/P CABG in 2007; c. LAST CATH WITH MODERATE DISEASE IN A VERY SMALL DIAGONAL BRANCH; d. 10/2014 Lexi MV: EF 61%, no ischemia/infarct.  . Dyslipidemia   . Monoclonal gammopathy of unknown significance   . Colon cancer 12/04/2011    s/p Laparoscopic-assisted transverse colectomy on 12/19/2011 by Dr. Donne Hazel.   pT3 N0 M0.   Marland Kitchen Hyperlipidemia   . PONV (postoperative nausea and vomiting)   . Fibromyalgia   . GERD (gastroesophageal reflux disease)     takes Pantoprazole daily  . Gastric ulcer   . Hypothyroid     takes Levothyroxine daily  . Depression     takes Cymbalta daily  . Diarrhea 05/10/2012  . Wears glasses   . Family history of adverse reaction to anesthesia     "son gets PONV"  . Iron deficiency anemia 11/17/2011  . AYTKZSWF(093.2)     "maybe monthly" (10/26/2014)  . 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 Arthritis     hands (10/26/2014)    Current Outpatient Prescriptions  Medication Sig Dispense Refill  . Azelastine HCl 0.15 % SOLN Place 1 spray into the nose as needed.  12  . carbamazepine (TEGRETOL-XR) 100 MG 12 hr tablet TAKE 5 TABLETS BY MOUTH TWICE A DAY 300 tablet 5  . Cholecalciferol (VITAMIN D-3) 1000 UNITS CAPS Take 1,000 Units by mouth daily.    . diphenhydrAMINE (BENADRYL) 25 MG tablet Take 25 mg by mouth every 6 (six) hours as needed. For allergies.    . DULoxetine (CYMBALTA) 60 MG capsule Take 60 mg by mouth  every morning.    Marland Kitchen estradiol (VIVELLE-DOT) 0.075 MG/24HR Place 1 patch onto the skin 2 (two) times a week. On Sundays and Wednesdays.    Marland Kitchen ethosuximide (ZARONTIN) 250 MG capsule TAKING 1 IN AM, 2 IN THE AFTERNOON, AND 2 AT NIGHT 150 capsule 1  . ezetimibe (ZETIA) 10 MG tablet Take 1 tablet (10 mg total) by mouth daily. 30 tablet 3  . levothyroxine (SYNTHROID, LEVOTHROID) 50 MCG tablet Take 50 mcg by mouth daily.     Marland Kitchen loratadine (CLARITIN) 10 MG tablet Take 10 mg by mouth daily as needed for allergies.    Marland Kitchen MINIVELLE 0.05 MG/24HR patch Apply 1 patch topically. Twice monthly  12  . nitroGLYCERIN (NITROSTAT) 0.4 MG SL tablet Place 1 tablet (0.4 mg total) under the tongue every 5 (five) minutes as needed. For chest pain. 25 tablet 1  . tiZANidine (ZANAFLEX) 2 MG tablet Take 1 tablet by mouth as needed.  2    No current facility-administered medications for this visit.     Allergies:   Aspirin; Erythromycin; Imdur; Lisinopril; Niacin and related; Penicillins; Statins; Sulfa drugs cross reactors; and Tetracyclines & related   Social History:  The patient  reports that she quit smoking about 33 years ago. Her smoking use included Cigarettes. She has a 6 pack-year smoking history. She has never used smokeless tobacco. She reports that she drinks about 0.6 oz of alcohol per week. She reports that she does not use illicit drugs.   Family History:  The patient's family history includes Cancer in her mother; Heart attack in her father; Heart attack (age of onset: 56) in her brother; Heart disease in her father. There is no history of Stroke.    ROS:  Please see the history of present illness.       All other systems reviewed and negative.    PHYSICAL EXAM: VS:  BP 122/82 mmHg  Pulse 90  Ht 5\' 8"  (1.727 m)  Wt 87.998 kg (194 lb)  BMI 29.50 kg/m2 Well nourished, well developed, in no acute distress HEENT: normal Neck: no JVD Carotids:  No bruits bilat Cardiac:  normal S1, S2;  RRR;  no murmur   Lungs:   clear to auscultation bilaterally, no wheezing, rhonchi or rales Abd: soft, nontender, no hepatomegaly Ext:  no edema Skin: warm and dry Neuro:  CNs 2-12 intact, no focal abnormalities noted  EKG:    Lab Results  Component Value Date   WBC 8.7 11/03/2014   HGB 13.5 11/03/2014   HCT 41.5 11/03/2014   PLT 310 11/03/2014   GLUCOSE 106 11/03/2014   CHOL 256* 01/12/2015   TRIG 111.0 01/12/2015   HDL 71.30 01/12/2015   LDLCALC 163* 01/12/2015   ALT 13 01/12/2015   AST 11 01/12/2015   NA 134* 11/03/2014   K 4.3 11/03/2014   CL 104 10/27/2014   CREATININE 0.7 11/03/2014   BUN 15.5 11/03/2014   CO2 25 11/03/2014   TSH 0.822 10/26/2014   INR 1.07 10/26/2014   HGBA1C 5.9* 10/26/2014      ASSESSMENT AND PLAN:   1.  CAD s/p CABG:  No angina.  She is on Effient for antiplatelet.   She is intol to statin.    2.  Hyperlipidemia:  She is intol to statins.  01/12/2015: ALT 13; HDL 71.30; LDL Cholesterol 163*    May be willing to try Livalo again. Does not want to try a PCSK9 inhibitor.    Disposition:   FU with Dr.  [name] [name] 1 year.    Signed, Doloras Tellado [name] MD, [facility]    01/14/2015 3:02 PM    [facility] [place], [place], [place]  [address] Phone: [phone]; Fax: [phone]

## 2015-01-19 ENCOUNTER — Ambulatory Visit: Payer: BLUE CROSS/BLUE SHIELD | Admitting: Pharmacist

## 2015-02-13 ENCOUNTER — Other Ambulatory Visit: Payer: Self-pay | Admitting: Neurology

## 2015-02-22 ENCOUNTER — Encounter: Payer: Self-pay | Admitting: Adult Health

## 2015-02-22 ENCOUNTER — Ambulatory Visit (INDEPENDENT_AMBULATORY_CARE_PROVIDER_SITE_OTHER): Payer: BLUE CROSS/BLUE SHIELD | Admitting: Adult Health

## 2015-02-22 VITALS — BP 126/81 | HR 82 | Ht 68.0 in | Wt 190.0 lb

## 2015-02-22 DIAGNOSIS — R569 Unspecified convulsions: Secondary | ICD-10-CM | POA: Diagnosis not present

## 2015-02-22 DIAGNOSIS — Z5181 Encounter for therapeutic drug level monitoring: Secondary | ICD-10-CM

## 2015-02-22 NOTE — Progress Notes (Signed)
PATIENT: Rachel Clark DOB: April 09, 1951  REASON FOR VISIT: follow up- seizures HISTORY FROM: patient  HISTORY OF PRESENT ILLNESS: Rachel Clark is a 64 year old female with a history of seizures. She returns today for follow-up. She continues to take carbamazepine and Zarontin and tolerates them well. She denies any seizure events. Denies any changes with her gait or balance. She is able to complete all ADLs independently. She operates a Teacher, music without difficulty. Denies any new neurological symptoms. She returns today for an evaluation.  HISTORY 02/19/14: Rachel Clark is a 64 year old female with a history of seizures. She returns today for follow-up. She is currently taking carbamazepine and zarontin and is tolerating them well. She denies any recent seizures. States that her last seizure was when she was 64 years old. Operating a motor a vehicle without difficulty. Denies any new neurological complaints. She states that since her last visit she had colon cancer but is currently in remission after surgery and chemotherapy.   REVIEW OF SYSTEMS: Out of a complete 14 system review of symptoms, the patient complains only of the following symptoms, and all other reviewed systems are negative.  Fatigue, environmental allergies, seizure, aching muscles  ALLERGIES: Allergies  Allergen Reactions  . Aspirin Other (See Comments)    GI upset  . Erythromycin     Gi upset  . Imdur [Isosorbide Mononitrate]     Pt does not recall reaction  . Lisinopril     cough  . Niacin And Related     Pt does not recall reactions   . Penicillins     Gi upset  . Statins     Muscle pain  . Sulfa Drugs Cross Reactors Swelling  . Tetracyclines & Related     HOME MEDICATIONS: Outpatient Prescriptions Prior to Visit  Medication Sig Dispense Refill  . Azelastine HCl 0.15 % SOLN Place 1 spray into the nose as needed.  12  . carbamazepine (TEGRETOL XR) 100 MG 12 hr tablet TAKE 5 TABLETS BY MOUTH TWICE  A DAY 300 tablet 0  . Cholecalciferol (VITAMIN D-3) 1000 UNITS CAPS Take 1,000 Units by mouth daily.    . diphenhydrAMINE (BENADRYL) 25 MG tablet Take 25 mg by mouth every 6 (six) hours as needed. For allergies.    . DULoxetine (CYMBALTA) 60 MG capsule Take 60 mg by mouth every morning.    Marland Kitchen estradiol (VIVELLE-DOT) 0.075 MG/24HR Place 1 patch onto the skin 2 (two) times a week. On Sundays and Wednesdays.    Marland Kitchen ethosuximide (ZARONTIN) 250 MG capsule TAKING 1 IN AM, 2 IN THE AFTERNOON, AND 2 AT NIGHT 150 capsule 1  . ezetimibe (ZETIA) 10 MG tablet Take 1 tablet (10 mg total) by mouth daily. 30 tablet 3  . levothyroxine (SYNTHROID, LEVOTHROID) 50 MCG tablet Take 50 mcg by mouth daily.     Marland Kitchen loratadine (CLARITIN) 10 MG tablet Take 10 mg by mouth daily as needed for allergies.    Marland Kitchen MINIVELLE 0.05 MG/24HR patch Apply 1 patch topically. Twice monthly  12  . nitroGLYCERIN (NITROSTAT) 0.4 MG SL tablet Place 1 tablet (0.4 mg total) under the tongue every 5 (five) minutes as needed. For chest pain. 25 tablet 1  . tiZANidine (ZANAFLEX) 2 MG tablet Take 1 tablet by mouth as needed.  2   No facility-administered medications prior to visit.    PAST MEDICAL HISTORY: Past Medical History  Diagnosis Date  . Coronary artery disease     a. Has had  remote NSTEMI with PCI to the RCA; b. S/P CABG in 2007; c. LAST CATH WITH MODERATE DISEASE IN A VERY SMALL DIAGONAL BRANCH; d. 10/2014 Lexi MV: EF 61%, no ischemia/infarct.  . Dyslipidemia   . Monoclonal gammopathy of unknown significance   . Colon cancer 12/04/2011    s/p Laparoscopic-assisted transverse colectomy on 12/19/2011 by Dr. Donne Hazel.  pT3 N0 M0.   Marland Kitchen Hyperlipidemia   . PONV (postoperative nausea and vomiting)   . Fibromyalgia   . GERD (gastroesophageal reflux disease)     takes Pantoprazole daily  . Gastric ulcer   . Hypothyroid     takes Levothyroxine daily  . Depression     takes Cymbalta daily  . Diarrhea 05/10/2012  . Wears glasses   . Family  history of adverse reaction to anesthesia     "son gets PONV"  . Iron deficiency anemia 11/17/2011  . OVZCHYIF(027.7)     "maybe monthly" (10/26/2014)  . 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 Arthritis     hands (10/26/2014)    PAST SURGICAL HISTORY: Past Surgical History  Procedure Laterality Date  . Posterior laminectomy / decompression cervical spine      20+yrs ago  . Colonoscopy    . Colon resection  12/19/2011    Procedure: COLON RESECTION LAPAROSCOPIC;  Surgeon: Rolm Bookbinder, MD;  Location: [place];  Service: General;  Laterality: N/A;  laparoscopic hand assisted partial colon resection  . Portacath placement  06/04/2012    Procedure: INSERTION PORT-A-CATH;  Surgeon: Rolm Bookbinder, MD;  Location: [place];  Service: General;  Laterality: N/A;  Insertion of port-a-cath   . Port-a-cath removal N/A 06/12/2013    Procedure: REMOVAL PORT-A-CATH;  Surgeon: Rolm Bookbinder, MD;  Location: [place];  Service: General;  Laterality: N/A;  . Colon surgery    . Anterior cervical decomp/discectomy fusion  1980's  . Back surgery    . Vaginal hysterectomy    . Dilation and curettage of uterus  1973  . Coronary artery bypass graft  2007    x 2  . Cardiac catheterization  05/20/2009    obstructive native vessel disease in LAD, RCA, and first diagonal, patent vein graft to distal RCA and LIMA to LAD,normal. ef 60%  . Coronary angioplasty with stent placement  ~ 2007    1 stent    FAMILY HISTORY: Family History  Problem Relation Age of Onset  . Heart attack Father   . Heart disease Father   . Heart attack Brother 21  . Cancer Mother     lung  . Stroke Neg Hx     SOCIAL HISTORY: Social History   Social History  . Marital Status: Married    Spouse Name: N/A  . Number of Children: 2  . Years of Education: 12   Occupational History  . office work     unemployed   Social History Main Topics  .  Smoking status: Former Smoker -- 0.30 packs/day for 20 years    Types: Cigarettes    Quit date: 06/06/1981  . Smokeless tobacco: Never Used  . Alcohol Use: 0.6 oz/week    1 Glasses of wine per week  . Drug Use: No  . Sexual Activity: Yes    Birth Control/ Protection: Surgical   Other Topics Concern  . Not on file   Social History Narrative   Patient is married with 2 children.   Patient  is right handed.   Patient has a high school education with some college education.   Patient drinks 2 cups daily.      PHYSICAL EXAM  Filed Vitals:   02/22/15 0837  BP: 126/81  Pulse: 82  Height: 5\' 8"  (1.727 m)  Weight: 190 lb (86.183 kg)   Body mass index is 28.9 kg/(m^2).  Generalized: Well developed, in no acute distress   Neurological examination  Mentation: Alert oriented to time, place, history taking. Follows all commands speech and language fluent Cranial nerve II-XII: Pupils were equal round reactive to light. Extraocular movements were full, visual field were full on confrontational test. Facial sensation and strength were normal. Uvula tongue midline. Head turning and shoulder shrug  were normal and symmetric. Motor: The motor testing reveals 5 over 5 strength of all 4 extremities. Good symmetric motor tone is noted throughout.  Sensory: Sensory testing is intact to soft touch on all 4 extremities. No evidence of extinction is noted.  Coordination: Cerebellar testing reveals good finger-nose-finger and heel-to-shin bilaterally.  Gait and station: Gait is normal. Tandem gait is normal. Romberg is negative. No drift is seen.  Reflexes: Deep tendon reflexes are symmetric and normal bilaterally.   DIAGNOSTIC DATA (LABS, IMAGING, TESTING) - I reviewed patient records, labs, notes, testing and imaging myself where available.  Lab Results  Component Value Date   WBC 8.7 11/03/2014   HGB 13.5 11/03/2014   HCT 41.5 11/03/2014   MCV 88.3 11/03/2014   PLT 310 11/03/2014        Component Value Date/Time   NA 134* 11/03/2014 1415   NA 136 10/27/2014 0400   NA 136 02/19/2014 1043   K 4.3 11/03/2014 1415   K 3.9 10/27/2014 0400   CL 104 10/27/2014 0400   CL 106 12/25/2012 0924   CO2 25 11/03/2014 1415   CO2 24 10/27/2014 0400   GLUCOSE 106 11/03/2014 1415   GLUCOSE 104* 10/27/2014 0400   GLUCOSE 93 02/19/2014 1043   GLUCOSE 105* 12/25/2012 0924   BUN 15.5 11/03/2014 1415   BUN 9 10/27/2014 0400   BUN 17 02/19/2014 1043   CREATININE 0.7 11/03/2014 1415   CREATININE 0.56 10/27/2014 0400   CALCIUM 9.3 11/03/2014 1415   CALCIUM 8.6 10/27/2014 0400   PROT 7.3 01/12/2015 0800   PROT 7.7 11/03/2014 1415   PROT 7.4 02/19/2014 1043   ALBUMIN 3.8 01/12/2015 0800   ALBUMIN 3.9 11/03/2014 1415   AST 11 01/12/2015 0800   AST 14 11/03/2014 1415   ALT 13 01/12/2015 0800   ALT 18 11/03/2014 1415   ALKPHOS 85 01/12/2015 0800   ALKPHOS 103 11/03/2014 1415   BILITOT 0.3 01/12/2015 0800   BILITOT 0.25 11/03/2014 1415   GFRNONAA >90 10/27/2014 0400   GFRAA >90 10/27/2014 0400   Lab Results  Component Value Date   CHOL 256* 01/12/2015   HDL 71.30 01/12/2015   LDLCALC 163* 01/12/2015   TRIG 111.0 01/12/2015   CHOLHDL 4 01/12/2015   Lab Results  Component Value Date   HGBA1C 5.9* 10/26/2014    Lab Results  Component Value Date   TSH 0.822 10/26/2014      ASSESSMENT AND PLAN 64 y.o. 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 here with:  1. Seizures  Overall the patient is doing well. She will  continue on carbamazepine and Zarontin. I will check serum drug levels today. Patient advised that if she has any seizure events she should let us know. Follow-up in one year with Dr. Jannifer Franklin.  Rachel Givens, MSN, NP-C 02/22/2015, 8:45 AM [facility] [address], [place], [place] [address] [phone]

## 2015-02-22 NOTE — Patient Instructions (Signed)
Continue Carbamazepine and Zarontin.  Will check blood work today.  If you have any seizure events please let us know.

## 2015-02-22 NOTE — Progress Notes (Signed)
I have read the note, and I agree with the clinical assessment and plan.  Casee Knepp KEITH   

## 2015-02-24 ENCOUNTER — Telehealth: Payer: Self-pay

## 2015-02-24 LAB — CARBAMAZEPINE LEVEL, TOTAL: Carbamazepine Lvl: 6.8 ug/mL (ref 4.0–12.0)

## 2015-02-24 LAB — ETHOSUXIMIDE LEVEL: ETHOSUXIMIDE LVL: 69 ug/mL (ref 40–100)

## 2015-02-24 NOTE — Telephone Encounter (Signed)
Spoke with patient and informed her of her normal lab results.  Patient verbalized understanding.

## 2015-02-27 ENCOUNTER — Other Ambulatory Visit: Payer: Self-pay | Admitting: Neurology

## 2015-03-18 ENCOUNTER — Emergency Department (HOSPITAL_COMMUNITY): Payer: BLUE CROSS/BLUE SHIELD

## 2015-03-18 ENCOUNTER — Emergency Department (HOSPITAL_COMMUNITY)
Admission: EM | Admit: 2015-03-18 | Discharge: 2015-03-18 | Disposition: A | Discharge status: Home / Self Care | Payer: BLUE CROSS/BLUE SHIELD | Attending: Emergency Medicine | Admitting: Emergency Medicine

## 2015-03-18 ENCOUNTER — Other Ambulatory Visit: Payer: Self-pay | Admitting: Physician Assistant

## 2015-03-18 ENCOUNTER — Encounter (HOSPITAL_COMMUNITY): Payer: Self-pay | Admitting: Emergency Medicine

## 2015-03-18 DIAGNOSIS — E039 Hypothyroidism, unspecified: Secondary | ICD-10-CM | POA: Insufficient documentation

## 2015-03-18 DIAGNOSIS — Z8679 Personal history of other diseases of the circulatory system: Secondary | ICD-10-CM

## 2015-03-18 DIAGNOSIS — I2510 Atherosclerotic heart disease of native coronary artery without angina pectoris: Secondary | ICD-10-CM | POA: Insufficient documentation

## 2015-03-18 DIAGNOSIS — Z862 Personal history of diseases of the blood and blood-forming organs and certain disorders involving the immune mechanism: Secondary | ICD-10-CM | POA: Diagnosis not present

## 2015-03-18 DIAGNOSIS — E785 Hyperlipidemia, unspecified: Secondary | ICD-10-CM | POA: Insufficient documentation

## 2015-03-18 DIAGNOSIS — R079 Chest pain, unspecified: Secondary | ICD-10-CM | POA: Diagnosis present

## 2015-03-18 DIAGNOSIS — I25119 Atherosclerotic heart disease of native coronary artery with unspecified angina pectoris: Secondary | ICD-10-CM | POA: Diagnosis not present

## 2015-03-18 DIAGNOSIS — K219 Gastro-esophageal reflux disease without esophagitis: Secondary | ICD-10-CM | POA: Insufficient documentation

## 2015-03-18 DIAGNOSIS — Z79899 Other long term (current) drug therapy: Secondary | ICD-10-CM | POA: Insufficient documentation

## 2015-03-18 DIAGNOSIS — I200 Unstable angina: Secondary | ICD-10-CM

## 2015-03-18 DIAGNOSIS — Z85038 Personal history of other malignant neoplasm of large intestine: Secondary | ICD-10-CM | POA: Diagnosis not present

## 2015-03-18 DIAGNOSIS — R072 Precordial pain: Secondary | ICD-10-CM

## 2015-03-18 DIAGNOSIS — F329 Major depressive disorder, single episode, unspecified: Secondary | ICD-10-CM | POA: Insufficient documentation

## 2015-03-18 DIAGNOSIS — M1990 Unspecified osteoarthritis, unspecified site: Secondary | ICD-10-CM | POA: Insufficient documentation

## 2015-03-18 LAB — I-STAT TROPONIN, ED: TROPONIN I, POC: 0 ng/mL (ref 0.00–0.08)

## 2015-03-18 LAB — CBC WITH DIFFERENTIAL/PLATELET
BASOS PCT: 0 %
Basophils Absolute: 0 10*3/uL (ref 0.0–0.1)
EOS ABS: 0.3 10*3/uL (ref 0.0–0.7)
Eosinophils Relative: 4 %
HEMATOCRIT: 41.4 % (ref 36.0–46.0)
HEMOGLOBIN: 13.9 g/dL (ref 12.0–15.0)
LYMPHS ABS: 2.3 10*3/uL (ref 0.7–4.0)
Lymphocytes Relative: 30 %
MCH: 29.3 pg (ref 26.0–34.0)
MCHC: 33.6 g/dL (ref 30.0–36.0)
MCV: 87.2 fL (ref 78.0–100.0)
Monocytes Absolute: 0.6 10*3/uL (ref 0.1–1.0)
Monocytes Relative: 7 %
NEUTROS PCT: 59 %
Neutro Abs: 4.6 10*3/uL (ref 1.7–7.7)
Platelets: 316 10*3/uL (ref 150–400)
RBC: 4.75 MIL/uL (ref 3.87–5.11)
RDW: 12.9 % (ref 11.5–15.5)
WBC: 7.8 10*3/uL (ref 4.0–10.5)

## 2015-03-18 LAB — BASIC METABOLIC PANEL
ANION GAP: 10 (ref 5–15)
BUN: 7 mg/dL (ref 6–20)
CO2: 23 mmol/L (ref 22–32)
Calcium: 9.1 mg/dL (ref 8.9–10.3)
Chloride: 101 mmol/L (ref 101–111)
Creatinine, Ser: 0.65 mg/dL (ref 0.44–1.00)
Glucose, Bld: 124 mg/dL — ABNORMAL HIGH (ref 65–99)
POTASSIUM: 4.4 mmol/L (ref 3.5–5.1)
SODIUM: 134 mmol/L — AB (ref 135–145)

## 2015-03-18 MED ORDER — ONDANSETRON HCL 4 MG/2ML IJ SOLN
4.0000 mg | Freq: Once | INTRAMUSCULAR | Status: AC
  Administered 2015-03-18: 4 mg via INTRAVENOUS
  Filled 2015-03-18: qty 2

## 2015-03-18 MED ORDER — NITROGLYCERIN 0.4 MG SL SUBL
0.4000 mg | SUBLINGUAL_TABLET | SUBLINGUAL | Status: DC | PRN
  Administered 2015-03-18 (×2): 0.4 mg via SUBLINGUAL
  Filled 2015-03-18 (×2): qty 1

## 2015-03-18 NOTE — ED Notes (Signed)
Patient transported to X-ray 

## 2015-03-18 NOTE — ED Notes (Signed)
Pt sts mid sternal CP starting while at work with radiation across back; pt sts some SOB and diaphoresis

## 2015-03-18 NOTE — Consult Note (Signed)
Patient ID: AMANTHA SKLAR MRN: [id number], DOB/AGE: [birth year]   Admit date: 03/18/2015   Primary Physician: Vidal Schwalbe, MD Primary Cardiologist: Dr [name]  Pt. Profile:  Rachel Clark is a 64 y.o. female with a history of CAD s/p CABG 2007, ASA intolerance due to GI upset, HL with statin intolerance, colon CA s/p resection and chemotherapy, seizure, fibromyalgia, GERD, hypothyroidism, former smoker, quit 33 years ago who presented to [facility] ED for evaluation of chest pain.   HPI:   Last cath in 10/2008 which showed obstructive native vessel disease in LAD, RCA, and first diagonal, patent vein graft to distal RCA and LIMA to LAD.She was admitted overnight April 2016 for unstable angina. She underwent lexiscan cardiolite stress testing, which showed no evidence of ischemia with normal LV function. She was seen in lipid clinic and given Livalo. She took it one day and didn't feel right so she stopped it. She was doing well on cardiac stand point of view when seen last by Dr. [name] 01/2015.   She was in her USOH until this afternoon when she suddenly developed substernal chest pressure "heaviness" that radiated to her left arm. At the time she was sitting at her desk at work. Somewhat similar to previous cardiac pain but not severe. No nausea, vomiting or SOB. She took SL nitro  x 1 and drove herself to ED. She denies any exertional CP or SOB. No orthopnea, PND, palpitations, dizziness, headache, LE edema or melana. No recent seizure.   In ED, POC trop in negative, lytes normal, cxr no acute cardiopulmonary disease. Low lung volume. EKG with NSR at rate of 102bpm, nonspecific ST and T wave abnormality. She was given SL nitro x 2 in ED and Iv zofran. Her chest pressure improved to 2/10 from 5/10.   Problem List  Past Medical History  Diagnosis Date  . Coronary artery disease     a. Has had remote NSTEMI with PCI to the RCA; b. S/P CABG in 2007; c. LAST CATH WITH MODERATE  DISEASE IN A VERY SMALL DIAGONAL BRANCH; d. 10/2014 Lexi MV: EF 61%, no ischemia/infarct.  . Dyslipidemia   . Monoclonal gammopathy of unknown significance   . Colon cancer 12/04/2011    s/p Laparoscopic-assisted transverse colectomy on 12/19/2011 by Dr. Donne Hazel.  pT3 N0 M0.   Marland Kitchen Hyperlipidemia   . PONV (postoperative nausea and vomiting)   . Fibromyalgia   . GERD (gastroesophageal reflux disease)     takes Pantoprazole daily  . Gastric ulcer   . Hypothyroid     takes Levothyroxine daily  . Depression     takes Cymbalta daily  . Diarrhea 05/10/2012  . Wears glasses   . Family history of adverse reaction to anesthesia     "son gets PONV"  . Iron deficiency anemia 11/17/2011  . CVELFYBO(175.1)     "maybe monthly" (10/26/2014)  . 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 Arthritis     hands (10/26/2014)    Past Surgical History  Procedure Laterality Date  . Posterior laminectomy / decompression cervical spine      20+yrs ago  . Colonoscopy    . Colon resection  12/19/2011    Procedure: COLON RESECTION LAPAROSCOPIC;  Surgeon: Rolm Bookbinder, MD;  Location: [place];  Service: General;  Laterality: N/A;  laparoscopic hand assisted partial colon resection  . Portacath placement  06/04/2012    Procedure: INSERTION PORT-A-CATH;  Surgeon: Rolm Bookbinder, MD;  Location: [place];  Service: General;  Laterality: N/A;  Insertion of port-a-cath   . Port-a-cath removal N/A 06/12/2013    Procedure: REMOVAL PORT-A-CATH;  Surgeon: Rolm Bookbinder, MD;  Location: [place];  Service: General;  Laterality: N/A;  . Colon surgery    . Anterior cervical decomp/discectomy fusion  1980's  . Back surgery    . Vaginal hysterectomy    . Dilation and curettage of uterus  1973  . Coronary artery bypass graft  2007    x 2  . Cardiac catheterization  05/20/2009    obstructive native vessel disease in LAD, RCA, and first diagonal, patent  vein graft to distal RCA and LIMA to LAD,normal. ef 60%  . Coronary angioplasty with stent placement  ~ 2007    1 stent     Allergies  Allergies  Allergen Reactions  . Aspirin Other (See Comments)    GI upset  . Erythromycin     Gi upset  . Imdur [Isosorbide Mononitrate]     Pt does not recall reaction  . Lisinopril     cough  . Niacin And Related     Pt does not recall reactions   . Penicillins     Gi upset  . Statins     Muscle pain  . Sulfa Drugs Cross Reactors Swelling  . Tetracyclines & Related      Home Medications  Prior to Admission medications   Medication Sig Start Date End Date Taking? Authorizing Provider  Azelastine HCl 0.15 % SOLN Place 1 spray into the nose as needed. 12/22/14   Historical Provider, MD  carbamazepine (TEGRETOL XR) 100 MG 12 hr tablet TAKE 5 TABLETS BY MOUTH TWICE A DAY 02/14/15   Kathrynn Ducking, MD  Cholecalciferol (VITAMIN D-3) 1000 UNITS CAPS Take 1,000 Units by mouth daily.    Historical Provider, MD  diphenhydrAMINE (BENADRYL) 25 MG tablet Take 25 mg by mouth every 6 (six) hours as needed. For allergies.    Historical Provider, MD  DULoxetine (CYMBALTA) 60 MG capsule Take 60 mg by mouth every morning.    Historical Provider, MD  estradiol (VIVELLE-DOT) 0.075 MG/24HR Place 1 patch onto the skin 2 (two) times a week. On Sundays and Wednesdays.    Historical Provider, MD  ethosuximide (ZARONTIN) 250 MG capsule TAKE ONE CAPSULE BY MOUTH IN THE MORNING THEN 2 CAPSULES IN THE AFTERNOON AND 2 CAPSULES AT NIGHT 02/27/15   Kathrynn Ducking, MD  ezetimibe (ZETIA) 10 MG tablet Take 1 tablet (10 mg total) by mouth daily. 07/09/14   Peter M [name], MD  levothyroxine (SYNTHROID, LEVOTHROID) 50 MCG tablet Take 50 mcg by mouth daily.     Historical Provider, MD  loratadine (CLARITIN) 10 MG tablet Take 10 mg by mouth daily as needed for allergies.    Historical Provider, MD  MINIVELLE 0.05 MG/24HR patch Apply 1 patch topically. Twice monthly 12/08/14    Historical Provider, MD  nitroGLYCERIN (NITROSTAT) 0.4 MG SL tablet Place 1 tablet (0.4 mg total) under the tongue every 5 (five) minutes as needed. For chest pain. 06/09/14   Peter M [name], MD  tiZANidine (ZANAFLEX) 2 MG tablet Take 1 tablet by mouth as needed. 01/06/15   Historical Provider, MD    Family History  Family History  Problem Relation Age of Onset  . Heart attack Father   . Heart disease Father   . Heart attack Brother 71  . Cancer Mother  lung  . Stroke Neg Hx    Family Status  Relation Status Death Age  . Father Deceased 35  . Brother Alive   . Mother Deceased     lung cancer  . Sister Alive   . Brother Alive   . Maternal Grandmother Deceased   . Maternal Grandfather Deceased   . Paternal Grandmother Deceased   . Paternal Grandfather Deceased      Social History  Social History   Social History  . Marital Status: Married    Spouse Name: N/A  . Number of Children: 2  . Years of Education: 12   Occupational History  . office work     unemployed   Social History Main Topics  . Smoking status: Former Smoker -- 0.30 packs/day for 20 years    Types: Cigarettes    Quit date: 06/06/1981  . Smokeless tobacco: Never Used  . Alcohol Use: 0.6 oz/week    1 Glasses of wine per week  . Drug Use: No  . Sexual Activity: Yes    Birth Control/ Protection: Surgical   Other Topics Concern  . Not on file   Social History Narrative   Patient is married with 2 children.   Patient is right handed.   Patient has a high school education with some college education.   Patient drinks 2 cups daily.     All other systems reviewed and are otherwise negative except as noted above.  Physical Exam  Blood pressure 110/61, pulse 97, temperature 98.3 F (36.8 C), temperature source Oral, resp. rate 20, SpO2 94 %.  General: Pleasant, NAD Psych: Normal affect. Neuro: Alert and oriented X 3. Moves all extremities spontaneously. HEENT: Normal  Neck: Supple without  bruits or JVD. Lungs:  Resp regular and unlabored, CTA. Heart: RRR no s3, s4, or murmurs. Abdomen: Soft, non-tender, non-distended, BS + x 4.  Extremities: No clubbing, cyanosis or edema. DP/PT/Radials 2+ and equal bilaterally.  Labs  No results for input(s): CKTOTAL, CKMB, TROPONINI in the last 72 hours. Lab Results  Component Value Date   WBC 7.8 03/18/2015   HGB 13.9 03/18/2015   HCT 41.4 03/18/2015   MCV 87.2 03/18/2015   PLT 316 03/18/2015    Recent Labs Lab 03/18/15 1252  NA 134*  K 4.4  CL 101  CO2 23  BUN 7  CREATININE 0.65  CALCIUM 9.1  GLUCOSE 124*   Lab Results  Component Value Date   CHOL 256* 01/12/2015   HDL 71.30 01/12/2015   LDLCALC 163* 01/12/2015   TRIG 111.0 01/12/2015    Radiology/Studies  Dg Chest 2 View  03/18/2015   CLINICAL DATA:  Chest pain.  Dizziness.  EXAM: CHEST - 2 VIEW  COMPARISON:  Two-view chest x-ray 11/03/2014.  FINDINGS: The heart size is normal. Median sternotomy for CABG is noted. No focal airspace disease is present. The lung volumes are low. There is no significant edema or effusion. The visualized soft tissues and bony thorax are otherwise unremarkable.  IMPRESSION: 1. Low lung volumes. 2. No acute cardiopulmonary disease.   Electronically Signed   By: San Morelle M.D.   On: 03/18/2015 13:16    ECG Vent. rate 102 BPM PR interval 178 ms QRS duration 96 ms QT/QTc 330/430 ms  Lexiscan Cardiolite 4.26.2016  IMPRESSION: Normal stress nuclear study with no chest pain, no ST changes and normal perfusion with no ischemia or infarction. The gated ejection fraction was 61% and the wall motion was normal.   ASSESSMENT  AND PLAN   1. Unstable Angina - Her chest pain is concerning, responsive to nitro. Somewhat similar to previous cardiac chest pain with less severity.  POC trop in negative, cxr no acute cardiopulmonary disease. Low lung volume. EKG with NSR at rate of 102bpm, nonspecific ST and T wave abnormality.  -  She was on Effient per Dr. Doug Sou last note for antiplatelet, however not listed on home medications. Patient states that she takes effient. She is intol to statin. Cough on lisinopril.  - Plan was to admit her and get cycle enzyme and get echo cardiogram. Also possible cath if positive enzyme. However, she just wanted to go home. Explained in detailed importance of staying in hospital, however she wanted to go home and understands the risk. Currently she is under a lot of stress. Not HI or SI. Will get f/u in clinic next with same day echo.   2. CAD s/p CABG in 2007 - last cath in 10/2008 which showed obstructive native vessel disease in LAD, RCA, and first diagonal, patent vein graft to distal RCA and LIMA to LAD - Lexiscan cardiolyte 10/27/14 - no ischemia or infraction. EF of 61% with normal wall motion.  - As above.  3. Hyperlipidemia - She is intol to statins. 01/12/2015: ALT 13; HDL 71.30; LDL Cholesterol 163*  - May be try Livalo gain. Does not want to try a PCSK9 inhibitor.    Signed, Leanor Kail, PA-C 03/18/2015, 2:41 PM   I have personally seen and examined this patient with Mr. Curly [place]. I agree with the assessment and plan as outlined above. She presented with chest pain. Troponin is negative x 1. EKG with subtle ST depression lateral leads. We have recommended admission to cycle cardiac markers but she refuses. She wishes to go home. She will not stay. Will arrange early outpatient follow up next week with echo.   Iriel Nason 03/18/2015 4:02 PM

## 2015-03-18 NOTE — Discharge Instructions (Signed)
Cardiology will call you to arrange a follow-up appointment.  You are welcome to return to the ER at any time should you desire the hospitalization and further workup which was offered to you.   Chest Pain (Nonspecific) It is often hard to give a specific diagnosis for the cause of chest pain. There is always a chance that your pain could be related to something serious, such as a heart attack or a blood clot in the lungs. You need to follow up with your health care provider for further evaluation. CAUSES   Heartburn.  Pneumonia or bronchitis.  Anxiety or stress.  Inflammation around your heart (pericarditis) or lung (pleuritis or pleurisy).  A blood clot in the lung.  A collapsed lung (pneumothorax). It can develop suddenly on its own (spontaneous pneumothorax) or from trauma to the chest.  Shingles infection (herpes zoster virus). The chest wall is composed of bones, muscles, and cartilage. Any of these can be the source of the pain.  The bones can be bruised by injury.  The muscles or cartilage can be strained by coughing or overwork.  The cartilage can be affected by inflammation and become sore (costochondritis). DIAGNOSIS  Lab tests or other studies may be needed to find the cause of your pain. Your health care provider may have you take a test called an ambulatory electrocardiogram (ECG). An ECG records your heartbeat patterns over a 24-hour period. You may also have other tests, such as:  Transthoracic echocardiogram (TTE). During echocardiography, sound waves are used to evaluate how blood flows through your heart.  Transesophageal echocardiogram (TEE).  Cardiac monitoring. This allows your health care provider to monitor your heart rate and rhythm in real time.  Holter monitor. This is a portable device that records your heartbeat and can help diagnose heart arrhythmias. It allows your health care provider to track your heart activity for several days, if  needed.  Stress tests by exercise or by giving medicine that makes the heart beat faster. TREATMENT   Treatment depends on what may be causing your chest pain. Treatment may include:  Acid blockers for heartburn.  Anti-inflammatory medicine.  Pain medicine for inflammatory conditions.  Antibiotics if an infection is present.  You may be advised to change lifestyle habits. This includes stopping smoking and avoiding alcohol, caffeine, and chocolate.  You may be advised to keep your head raised (elevated) when sleeping. This reduces the chance of acid going backward from your stomach into your esophagus. Most of the time, nonspecific chest pain will improve within 2-3 days with rest and mild pain medicine.  HOME CARE INSTRUCTIONS   If antibiotics were prescribed, take them as directed. Finish them even if you start to feel better.  For the next few days, avoid physical activities that bring on chest pain. Continue physical activities as directed.  Do not use any tobacco products, including cigarettes, chewing tobacco, or electronic cigarettes.  Avoid drinking alcohol.  Only take medicine as directed by your health care provider.  Follow your health care provider's suggestions for further testing if your chest pain does not go away.  Keep any follow-up appointments you made. If you do not go to an appointment, you could develop lasting (chronic) problems with pain. If there is any problem keeping an appointment, call to reschedule. SEEK MEDICAL CARE IF:   Your chest pain does not go away, even after treatment.  You have a rash with blisters on your chest.  You have a fever. [place]  CARE IF:   You have increased chest pain or pain that spreads to your arm, neck, jaw, back, or abdomen.  You have shortness of breath.  You have an increasing cough, or you cough up blood.  You have severe back or abdominal pain.  You feel nauseous or vomit.  You have severe  weakness.  You faint.  You have chills. This is an emergency. Do not wait to see if the pain will go away. Get medical help at once. Call your local emergency services (911 in [place] Do not drive yourself to the hospital. MAKE SURE YOU:   Understand these instructions.  Will watch your condition.  Will get help right away if you are not doing well or get worse. Document Released: 03/29/2005 Document Revised: 06/24/2013 Document Reviewed: 01/23/2008 [facility] Patient Information 2015 [place], [place]. This information is not intended to replace advice given to you by your health care provider. Make sure you discuss any questions you have with your health care provider.

## 2015-03-18 NOTE — ED Provider Notes (Signed)
CSN: [id number]     Arrival date & time 03/18/15  1239 History   First MD Initiated Contact with Patient 03/18/15 1252     Chief Complaint  Patient presents with  . Chest Pain     (Consider location/radiation/quality/duration/timing/severity/associated sxs/prior Treatment) HPI Comments: Patient is a 64 year old female with past medical history of coronary artery disease status post right past surgery 8 years ago. She presents with complaints of chest discomfort that started while she was sitting at her desk at work. She describes it as a pressure and heaviness to the front of her chest that radiates into her left shoulder. She feels slightly short of breath and reports some sweatiness at work. She took a nitroglycerin with some relief.  She is followed by Dr. [name] from cardiology.  Patient is a 64 y.o. female presenting with chest pain. The history is provided by the patient.  Chest Pain Pain location:  Substernal area Pain quality: tightness   Pain radiates to:  L shoulder Pain radiates to the back: no   Pain severity:  Moderate Onset quality:  Sudden Duration:  1 hour Timing:  Constant Progression:  Unchanged Chronicity:  New Relieved by:  Nitroglycerin Worsened by:  Nothing tried Associated symptoms: no abdominal pain     Past Medical History  Diagnosis Date  . Coronary artery disease     a. Has had remote NSTEMI with PCI to the RCA; b. S/P CABG in 2007; c. LAST CATH WITH MODERATE DISEASE IN A VERY SMALL DIAGONAL BRANCH; d. 10/2014 Lexi MV: EF 61%, no ischemia/infarct.  . Dyslipidemia   . Monoclonal gammopathy of unknown significance   . Colon cancer 12/04/2011    s/p Laparoscopic-assisted transverse colectomy on 12/19/2011 by Dr. Donne Hazel.  pT3 N0 M0.   Marland Kitchen Hyperlipidemia   . PONV (postoperative nausea and vomiting)   . Fibromyalgia   . GERD (gastroesophageal reflux disease)     takes Pantoprazole daily  . Gastric ulcer   . Hypothyroid     takes Levothyroxine daily  .  Depression     takes Cymbalta daily  . Diarrhea 05/10/2012  . Wears glasses   . Family history of adverse reaction to anesthesia     "son gets PONV"  . Iron deficiency anemia 11/17/2011  . DDUKGURK(270.6)     "maybe monthly" (10/26/2014)  . 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 Arthritis     hands (10/26/2014)   Past Surgical History  Procedure Laterality Date  . Posterior laminectomy / decompression cervical spine      20+yrs ago  . Colonoscopy    . Colon resection  12/19/2011    Procedure: COLON RESECTION LAPAROSCOPIC;  Surgeon: Rolm Bookbinder, MD;  Location: [place];  Service: General;  Laterality: N/A;  laparoscopic hand assisted partial colon resection  . Portacath placement  06/04/2012    Procedure: INSERTION PORT-A-CATH;  Surgeon: Rolm Bookbinder, MD;  Location: [facility];  Service: General;  Laterality: N/A;  Insertion of port-a-cath   . Port-a-cath removal N/A 06/12/2013    Procedure: REMOVAL PORT-A-CATH;  Surgeon: Rolm Bookbinder, MD;  Location: [place];  Service: General;  Laterality: N/A;  . Colon surgery    . Anterior cervical decomp/discectomy fusion  1980's  . Back surgery    . Vaginal hysterectomy    . Dilation and curettage of uterus  1973  . Coronary artery bypass graft  2007    x 2  .  Cardiac catheterization  05/20/2009    obstructive native vessel disease in LAD, RCA, and first diagonal, patent vein graft to distal RCA and LIMA to LAD,normal. ef 60%  . Coronary angioplasty with stent placement  ~ 2007    1 stent   Family History  Problem Relation Age of Onset  . Heart attack Father   . Heart disease Father   . Heart attack Brother 41  . Cancer Mother     lung  . Stroke Neg Hx    Social History  Substance Use Topics  . Smoking status: Former Smoker -- 0.30 packs/day for 20 years    Types: Cigarettes    Quit date: 06/06/1981  . Smokeless tobacco: Never Used  . Alcohol Use:  0.6 oz/week    1 Glasses of wine per week   OB History    No data available     Review of Systems  Cardiovascular: Positive for chest pain.  Gastrointestinal: Negative for abdominal pain.  All other systems reviewed and are negative.     Allergies  Aspirin; Erythromycin; Imdur; Lisinopril; Niacin and related; Penicillins; Statins; Sulfa drugs cross reactors; and Tetracyclines & related  Home Medications   Prior to Admission medications   Medication Sig Start Date End Date Taking? Authorizing Provider  Azelastine HCl 0.15 % SOLN Place 1 spray into the nose as needed. 12/22/14   Historical Provider, MD  carbamazepine (TEGRETOL XR) 100 MG 12 hr tablet TAKE 5 TABLETS BY MOUTH TWICE A DAY 02/14/15   Kathrynn Ducking, MD  Cholecalciferol (VITAMIN D-3) 1000 UNITS CAPS Take 1,000 Units by mouth daily.    Historical Provider, MD  diphenhydrAMINE (BENADRYL) 25 MG tablet Take 25 mg by mouth every 6 (six) hours as needed. For allergies.    Historical Provider, MD  DULoxetine (CYMBALTA) 60 MG capsule Take 60 mg by mouth every morning.    Historical Provider, MD  estradiol (VIVELLE-DOT) 0.075 MG/24HR Place 1 patch onto the skin 2 (two) times a week. On Sundays and Wednesdays.    Historical Provider, MD  ethosuximide (ZARONTIN) 250 MG capsule TAKE ONE CAPSULE BY MOUTH IN THE MORNING THEN 2 CAPSULES IN THE AFTERNOON AND 2 CAPSULES AT NIGHT 02/27/15   Kathrynn Ducking, MD  ezetimibe (ZETIA) 10 MG tablet Take 1 tablet (10 mg total) by mouth daily. 07/09/14   Peter M [name], MD  levothyroxine (SYNTHROID, LEVOTHROID) 50 MCG tablet Take 50 mcg by mouth daily.     Historical Provider, MD  loratadine (CLARITIN) 10 MG tablet Take 10 mg by mouth daily as needed for allergies.    Historical Provider, MD  MINIVELLE 0.05 MG/24HR patch Apply 1 patch topically. Twice monthly 12/08/14   Historical Provider, MD  nitroGLYCERIN (NITROSTAT) 0.4 MG SL tablet Place 1 tablet (0.4 mg total) under the tongue every 5 (five)  minutes as needed. For chest pain. 06/09/14   Peter M [name], MD  tiZANidine (ZANAFLEX) 2 MG tablet Take 1 tablet by mouth as needed. 01/06/15   Historical Provider, MD   BP 165/96 mmHg  Pulse 105  Temp(Src) 98.3 F (36.8 C) (Oral)  Resp 18  SpO2 96% Physical Exam  Constitutional: She is oriented to person, place, and time. She appears well-developed and well-nourished. No distress.  HENT:  Head: Normocephalic and atraumatic.  Neck: Normal range of motion. Neck supple.  Cardiovascular: Normal rate and regular rhythm.  Exam reveals no gallop and no friction rub.   No murmur heard. Pulmonary/Chest: Effort normal and breath sounds  normal. No respiratory distress. She has no wheezes.  Abdominal: Soft. Bowel sounds are normal. She exhibits no distension. There is no tenderness.  Musculoskeletal: Normal range of motion. She exhibits no edema.  Neurological: She is alert and oriented to person, place, and time.  Skin: Skin is warm and dry. She is not diaphoretic.  Nursing note and vitals reviewed.   ED Course  Procedures (including critical care time) Labs Review Labs Reviewed  BASIC METABOLIC PANEL  CBC WITH DIFFERENTIAL/PLATELET    Imaging Review No results found. I have personally reviewed and evaluated these images and lab results as part of my medical decision-making.   EKG Interpretation   Date/Time:  Thursday March 18 2015 12:46:44 EDT Ventricular Rate:  102 PR Interval:  178 QRS Duration: 96 QT Interval:  330 QTC Calculation: 430 R Axis:   56 Text Interpretation:  Sinus tachycardia Nonspecific ST and T wave  abnormality Abnormal ECG Confirmed by Leyla Soliz  MD, Darrian Goodwill ([id number]) on  03/18/2015 12:54:33 PM      MDM   Final diagnoses:  None    Patient presents with chest discomfort unrelieved with nitroglycerin. Her workup reveals a negative troponin and unchanged EKG. She was given additional nitroglycerin here with little relief. I plan to give additional pain  medication, however the patient is refusing this at this time. Due to her cardiac history, I feel as though evaluation by cardiology as indicated. She was evaluated by Dr. Julianne Handler who is recommending admission, however the patient does not want to stay. She states she feels better and wants to leave. She was advised against this, however insists upon going home. They will make arrangements for her to follow-up as an outpatient. She understands she can return at any time if her symptoms worsen or change.    Veryl Speak, MD 03/18/15 [phone]

## 2015-03-23 ENCOUNTER — Encounter: Payer: Self-pay | Admitting: Physician Assistant

## 2015-03-23 NOTE — Progress Notes (Addendum)
Cardiology Office Note Date:  03/24/2015  Patient ID:  Rachel Clark, Rachel Clark 1951/04/21, MRN [id number] PCP:  Vidal Schwalbe, MD  Cardiologist: Dr. [name]   Chief Complaint: f/u ER visit for chest pain  History of Present Illness: Rachel Clark is a 64 y.o. female with history of CAD (remote NSTEMI s/p PCI to RCA, s/p CABG 2007), ASA intolerance due to GI upset, HL with statin intolerance, colon CA s/p resection and chemotherapy, seizure, fibromyalgia, GERD, hypothyroidism, former smoker (quit 70 y/a) who presents for post-ER follow-up. Last cath in 2010 - 2 v CAD with L-LAD and S-RCA patent, small caliber Dx 70% (treated medically - no amenable to PCI), CFX free of significant disease, normal LVF. She had a nuclear study in 10/2014 which showed no evidence of ischemia with normal LV function.She was seen in lipid clinic and given Livalo. She took it one day and didn't feel right so she stopped it. She was doing well on cardiac standpoint when seen in the office by Dr. [name] 01/2015. She was more recently seen in the ER 9/15/6 with an episode of chest pressure/heaviness while sitting at work - it was somewhat similar to prior cardiac pain but less severe. She took 1 SL NTG and drove herself to the ER. In ED, POC trop in negative, lytes OK except mildly elevated glucose 124 and Na 134, CBC normal, CXR no acute cardiopulmonary disease; low lung volume. EKG with NSR at rate of 102bpm, nonspecific ST and T wave abnormalities (slight ST sagging in I, avL, V2). She was given SL nitro x 2 in ED and Iv zofran. Her chest pressure improved from 5/10 to 2/10. Admission for observation and rule-out was recommended, however, she declined and wanted to go home. She reported being under a lot of stress - husband, son, and dog just had surgery. Outpatient echo with same-day appointment was arranged for today.  However, this AM when coming into clinic she developed chest tightness/pressure similar to her prior  angina. It started about 15 minutes ago. It is not worse with inspiration or palpation. She is unable to tell me if it is worse with exertion. She also feels very lightheaded. VSS in the office today and EKG nonacute. Once her husband was back in the room, he mentioned that she had passed out yesterday which she initially did not report. She admits she was sitting at her desk yesterday when she suddenly woke up with people all around her. She was still sitting up, so she says no idea how long she was actually out for. She had no preceding symptoms. Afterwards she came right to. No bowel/bladder incontinence or reported seizure activity (h/o seizure 43 years ago but none since). She also says she has been off her Effient for unknown period of time, but not for any particular reason. She was supposed to be on this per 01/2015 office note. No LEE, palpitations, bleeding, recent surgery/travel/bedrest.   Past Medical History  Diagnosis Date  . Coronary artery disease     a. remote NSTEMI with PCI to the RCA; b. S/P CABG in 2007; c. 2010 - 2 v CAD with L-LAD and S-RCA patent, small caliber Dx 70% (treated medically - no amenable to PCI), CFX free of significant disease, normal LVF. d. 10/2014 Lexi MV: EF 61%, no ischemia/infarct.  . Dyslipidemia   . Monoclonal gammopathy of unknown significance   . Colon cancer 12/04/2011    s/p Laparoscopic-assisted transverse colectomy on 12/19/2011 by Dr. Donne Hazel.  pT3 N0 M0.   Marland Kitchen Hyperlipidemia   . PONV (postoperative nausea and vomiting)   . Fibromyalgia   . GERD (gastroesophageal reflux disease)   . Gastric ulcer   . Hypothyroid   . Depression   . Wears glasses   . Family history of adverse reaction to anesthesia     "son gets PONV"  . Iron deficiency anemia 11/17/2011  . YQMVHQIO(962.9)     "maybe monthly" (10/26/2014)  . 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 Arthritis     hands (10/26/2014)    Past  Surgical History  Procedure Laterality Date  . Posterior laminectomy / decompression cervical spine      20+yrs ago  . Colonoscopy    . Colon resection  12/19/2011    Procedure: COLON RESECTION LAPAROSCOPIC;  Surgeon: Rolm Bookbinder, MD;  Location: [facility];  Service: General;  Laterality: N/A;  laparoscopic hand assisted partial colon resection  . Portacath placement  06/04/2012    Procedure: INSERTION PORT-A-CATH;  Surgeon: Rolm Bookbinder, MD;  Location: [facility];  Service: General;  Laterality: N/A;  Insertion of port-a-cath   . Port-a-cath removal N/A 06/12/2013    Procedure: REMOVAL PORT-A-CATH;  Surgeon: Rolm Bookbinder, MD;  Location: [facility];  Service: General;  Laterality: N/A;  . Colon surgery    . Anterior cervical decomp/discectomy fusion  1980's  . Back surgery    . Vaginal hysterectomy    . Dilation and curettage of uterus  1973  . Coronary artery bypass graft  2007    x 2  . Cardiac catheterization  05/20/2009    obstructive native vessel disease in LAD, RCA, and first diagonal, patent vein graft to distal RCA and LIMA to LAD,normal. ef 60%  . Coronary angioplasty with stent placement  ~ 2007    1 stent    Current Outpatient Prescriptions  Medication Sig Dispense Refill  . Azelastine HCl 0.15 % SOLN Place 1 spray into the nose as needed (allergies).   12  . carbamazepine (TEGRETOL XR) 100 MG 12 hr tablet TAKE 5 TABLETS BY MOUTH TWICE A DAY 300 tablet 0  . Cholecalciferol (VITAMIN D-3) 1000 UNITS CAPS Take 1,000 Units by mouth daily.    . diphenhydrAMINE (BENADRYL) 25 MG tablet Take 25 mg by mouth every 6 (six) hours as needed. For allergies.    . DULoxetine (CYMBALTA) 60 MG capsule Take 60 mg by mouth every morning.    Marland Kitchen ethosuximide (ZARONTIN) 250 MG capsule TAKE ONE CAPSULE BY MOUTH IN THE MORNING THEN 2 CAPSULES IN THE AFTERNOON AND 2 CAPSULES AT NIGHT 150 capsule 11  . ezetimibe (ZETIA) 10 MG tablet Take 1 tablet (10 mg total) by mouth daily. 30  tablet 3  . levothyroxine (SYNTHROID, LEVOTHROID) 50 MCG tablet Take 50 mcg by mouth daily.     Marland Kitchen loratadine (CLARITIN) 10 MG tablet Take 10 mg by mouth daily as needed for allergies.    Marland Kitchen MINIVELLE 0.05 MG/24HR patch Apply 1 patch topically. Twice monthly  12  . nitroGLYCERIN (NITROSTAT) 0.4 MG SL tablet Place 1 tablet (0.4 mg total) under the tongue every 5 (five) minutes as needed. For chest pain. 25 tablet 1  . tiZANidine (ZANAFLEX) 2 MG tablet Take 1 tablet by mouth as needed for muscle spasms.   2   No current facility-administered medications for this visit.    Allergies:   Aspirin; Erythromycin; Imdur; Lisinopril; Niacin and  related; Penicillins; Statins; Sulfa drugs cross reactors; and Tetracyclines & related   Social History:  The patient  reports that she quit smoking about 33 years ago. Her smoking use included Cigarettes. She has a 6 pack-year smoking history. She has never used smokeless tobacco. She reports that she drinks about 0.6 oz of alcohol per week. She reports that she does not use illicit drugs.   Family History:  The patient's family history includes Cancer in her mother; Heart attack in her father; Heart attack (age of onset: 86) in her brother; Heart disease in her father. There is no history of Stroke.  ROS:  Please see the history of present illness.   All other systems are reviewed and otherwise negative.   PHYSICAL EXAM:  VS:  BP 132/80 mmHg  Pulse 64  Ht 5\' 9"  (1.753 m)  Wt 192 lb 9.6 oz (87.363 kg)  BMI 28.43 kg/m2  SpO2 98% BMI: Body mass index is 28.43 kg/(m^2). Well nourished, well developed WF in no acute distress HEENT: normocephalic, atraumatic Neck: no JVD, carotid bruits or masses Cardiac:  normal S1, S2; RRR; no murmurs, rubs, or gallops Lungs:  clear to auscultation bilaterally, no wheezing, rhonchi or rales Abd: soft, nontender, no hepatomegaly, + BS MS: no deformity or atrophy Ext: no edema Skin: warm and dry, no rash Neuro:  moves all  extremities spontaneously, no focal abnormalities noted, follows commands Psych: euthymic mood, full affect   EKG:  Done today shows NSR 64bpm, TWI in avL/V2 otherwise no acute changes.  Recent Labs: 10/26/2014: Magnesium 2.2; TSH 0.822 01/12/2015: ALT 13 03/18/2015: BUN 7; Creatinine, Ser 0.65; Hemoglobin 13.9; Platelets 316; Potassium 4.4; Sodium 134*  01/12/2015: Cholesterol 256*; HDL 71.30; LDL Cholesterol 163*; Total CHOL/HDL Ratio 4; Triglycerides 111.0; VLDL 22.2   Estimated Creatinine Clearance: 82.7 mL/min (by C-G formula based on Cr of 0.65).   Wt Readings from Last 3 Encounters:  03/24/15 192 lb 9.6 oz (87.363 kg)  02/22/15 190 lb (86.183 kg)  01/14/15 194 lb (87.998 kg)     Other studies reviewed: Additional studies/records reviewed today include: summarized above  ASSESSMENT AND PLAN:  1. Chest pain with history of CAD as above - this is now episode #2 at rest. EKG while in the ER showed nonspecific ST sagging. Although EKG is now nonacute today, symptoms are concerning for recurrent angina and will require definitive evaluation. Discussed case with Dr. Ron Parker who saw the patient with me. Given ongoing symptoms, we have recommended admission for rule-out and cardiac cath. We do not feel an echo alone will give [place] the answers we need. Risks and benefits of cardiac catheterization have been discussed with the patient. These include bleeding, infection, kidney damage, stroke, heart attack, death.  The patient understands these risks and is willing to proceed. Will place her on heparin per pharmacy. Discussed issue of Effient with Dr. Ron Parker - she reports she is unable to take aspirin. We will defer to the cathing team about chose of antiplatelet agent/loading at time of cath. We will also premedicate her as she thinks she has a prior history of dye allergy many years ago. 2. Syncope - unclear etiology but concerning for cardiac arrhythmia given abrupt nature. Monitor on telemetry. May  need event monitoring at discharge. Check labs. Will order echo for after cardiac cath. Depending on results would consider neurology evaluation given remote history of seizure. 3. Hyperlipidemia - per notes, intolerant of statins and does not wish to try a PCSK-9 inhibitor. She  is on Zetia. 4. Hyperglycemia - check A1C in the hospital -will defer further f/u to PCP.  Disposition: F/u TBD after hospitalization.  Current medicines are reviewed at length with the patient today.  The patient did not have any concerns regarding medicines.  Raechel Ache PA-C 03/24/2015 8:38 AM     [facility] [place] [address] [phone] (office)  [phone] (fax)

## 2015-03-24 ENCOUNTER — Ambulatory Visit (HOSPITAL_COMMUNITY): Payer: BLUE CROSS/BLUE SHIELD

## 2015-03-24 ENCOUNTER — Encounter (HOSPITAL_COMMUNITY)
Admission: AD | Disposition: A | Discharge status: Home / Self Care | Payer: Self-pay | Source: Ambulatory Visit | Attending: Cardiology

## 2015-03-24 ENCOUNTER — Encounter (HOSPITAL_COMMUNITY): Payer: Self-pay | Admitting: Emergency Medicine

## 2015-03-24 ENCOUNTER — Ambulatory Visit (INDEPENDENT_AMBULATORY_CARE_PROVIDER_SITE_OTHER): Payer: BLUE CROSS/BLUE SHIELD | Admitting: Physician Assistant

## 2015-03-24 ENCOUNTER — Inpatient Hospital Stay (HOSPITAL_COMMUNITY)
Admission: AD | Admit: 2015-03-24 | Discharge: 2015-03-26 | DRG: 243 | Disposition: A | Discharge status: Home / Self Care | Payer: BLUE CROSS/BLUE SHIELD | Source: Ambulatory Visit | Attending: Cardiology | Admitting: Cardiology

## 2015-03-24 ENCOUNTER — Other Ambulatory Visit: Payer: Self-pay

## 2015-03-24 ENCOUNTER — Encounter: Payer: Self-pay | Admitting: Physician Assistant

## 2015-03-24 ENCOUNTER — Observation Stay (HOSPITAL_COMMUNITY): Payer: BLUE CROSS/BLUE SHIELD

## 2015-03-24 VITALS — BP 132/80 | HR 64 | Ht 69.0 in | Wt 192.6 lb

## 2015-03-24 DIAGNOSIS — I2582 Chronic total occlusion of coronary artery: Secondary | ICD-10-CM | POA: Diagnosis present

## 2015-03-24 DIAGNOSIS — E039 Hypothyroidism, unspecified: Secondary | ICD-10-CM | POA: Diagnosis present

## 2015-03-24 DIAGNOSIS — Z881 Allergy status to other antibiotic agents status: Secondary | ICD-10-CM

## 2015-03-24 DIAGNOSIS — R55 Syncope and collapse: Secondary | ICD-10-CM

## 2015-03-24 DIAGNOSIS — K219 Gastro-esophageal reflux disease without esophagitis: Secondary | ICD-10-CM | POA: Diagnosis present

## 2015-03-24 DIAGNOSIS — I252 Old myocardial infarction: Secondary | ICD-10-CM

## 2015-03-24 DIAGNOSIS — I2510 Atherosclerotic heart disease of native coronary artery without angina pectoris: Secondary | ICD-10-CM

## 2015-03-24 DIAGNOSIS — R079 Chest pain, unspecified: Secondary | ICD-10-CM

## 2015-03-24 DIAGNOSIS — E785 Hyperlipidemia, unspecified: Secondary | ICD-10-CM | POA: Diagnosis present

## 2015-03-24 DIAGNOSIS — I209 Angina pectoris, unspecified: Secondary | ICD-10-CM | POA: Diagnosis present

## 2015-03-24 DIAGNOSIS — I200 Unstable angina: Secondary | ICD-10-CM | POA: Diagnosis not present

## 2015-03-24 DIAGNOSIS — R001 Bradycardia, unspecified: Secondary | ICD-10-CM | POA: Diagnosis present

## 2015-03-24 DIAGNOSIS — Z882 Allergy status to sulfonamides status: Secondary | ICD-10-CM | POA: Diagnosis not present

## 2015-03-24 DIAGNOSIS — Z955 Presence of coronary angioplasty implant and graft: Secondary | ICD-10-CM | POA: Diagnosis not present

## 2015-03-24 DIAGNOSIS — Z85038 Personal history of other malignant neoplasm of large intestine: Secondary | ICD-10-CM

## 2015-03-24 DIAGNOSIS — Z87891 Personal history of nicotine dependence: Secondary | ICD-10-CM

## 2015-03-24 DIAGNOSIS — I25110 Atherosclerotic heart disease of native coronary artery with unstable angina pectoris: Secondary | ICD-10-CM | POA: Diagnosis present

## 2015-03-24 DIAGNOSIS — Z880 Allergy status to penicillin: Secondary | ICD-10-CM | POA: Diagnosis not present

## 2015-03-24 DIAGNOSIS — M797 Fibromyalgia: Secondary | ICD-10-CM | POA: Diagnosis present

## 2015-03-24 DIAGNOSIS — F329 Major depressive disorder, single episode, unspecified: Secondary | ICD-10-CM | POA: Diagnosis present

## 2015-03-24 DIAGNOSIS — R739 Hyperglycemia, unspecified: Secondary | ICD-10-CM | POA: Diagnosis present

## 2015-03-24 DIAGNOSIS — Z959 Presence of cardiac and vascular implant and graft, unspecified: Secondary | ICD-10-CM

## 2015-03-24 DIAGNOSIS — I442 Atrioventricular block, complete: Secondary | ICD-10-CM | POA: Diagnosis present

## 2015-03-24 DIAGNOSIS — I25710 Atherosclerosis of autologous vein coronary artery bypass graft(s) with unstable angina pectoris: Secondary | ICD-10-CM | POA: Diagnosis present

## 2015-03-24 DIAGNOSIS — Z886 Allergy status to analgesic agent status: Secondary | ICD-10-CM

## 2015-03-24 DIAGNOSIS — Z9221 Personal history of antineoplastic chemotherapy: Secondary | ICD-10-CM | POA: Diagnosis not present

## 2015-03-24 DIAGNOSIS — Z888 Allergy status to other drugs, medicaments and biological substances status: Secondary | ICD-10-CM

## 2015-03-24 DIAGNOSIS — Z951 Presence of aortocoronary bypass graft: Secondary | ICD-10-CM

## 2015-03-24 DIAGNOSIS — R42 Dizziness and giddiness: Secondary | ICD-10-CM

## 2015-03-24 DIAGNOSIS — I25810 Atherosclerosis of coronary artery bypass graft(s) without angina pectoris: Secondary | ICD-10-CM | POA: Diagnosis not present

## 2015-03-24 HISTORY — PX: CARDIAC CATHETERIZATION: SHX172

## 2015-03-24 LAB — CBC WITH DIFFERENTIAL/PLATELET
BASOS PCT: 0 %
Basophils Absolute: 0 10*3/uL (ref 0.0–0.1)
EOS ABS: 0 10*3/uL (ref 0.0–0.7)
Eosinophils Relative: 1 %
HCT: 38.5 % (ref 36.0–46.0)
HEMOGLOBIN: 12.9 g/dL (ref 12.0–15.0)
Lymphocytes Relative: 24 %
Lymphs Abs: 1.9 10*3/uL (ref 0.7–4.0)
MCH: 29.3 pg (ref 26.0–34.0)
MCHC: 33.5 g/dL (ref 30.0–36.0)
MCV: 87.3 fL (ref 78.0–100.0)
Monocytes Absolute: 0.4 10*3/uL (ref 0.1–1.0)
Monocytes Relative: 5 %
NEUTROS PCT: 70 %
Neutro Abs: 5.6 10*3/uL (ref 1.7–7.7)
Platelets: 318 10*3/uL (ref 150–400)
RBC: 4.41 MIL/uL (ref 3.87–5.11)
RDW: 12.7 % (ref 11.5–15.5)
WBC: 7.9 10*3/uL (ref 4.0–10.5)

## 2015-03-24 LAB — SURGICAL PCR SCREEN
MRSA, PCR: NEGATIVE
Staphylococcus aureus: NEGATIVE

## 2015-03-24 LAB — BASIC METABOLIC PANEL
Anion gap: 7 (ref 5–15)
BUN: 9 mg/dL (ref 6–20)
CHLORIDE: 100 mmol/L — AB (ref 101–111)
CO2: 27 mmol/L (ref 22–32)
CREATININE: 0.61 mg/dL (ref 0.44–1.00)
Calcium: 8.9 mg/dL (ref 8.9–10.3)
GFR calc Af Amer: >60 mL/min (ref >60)
GFR calc non Af Amer: >60 mL/min (ref >60)
GLUCOSE: 111 mg/dL — AB (ref 65–99)
POTASSIUM: 4.3 mmol/L (ref 3.5–5.1)
SODIUM: 134 mmol/L — AB (ref 135–145)

## 2015-03-24 LAB — TROPONIN I
TROPONIN I: 0.18 ng/mL — AB (ref <0.031)
Troponin I: 0.32 ng/mL — ABNORMAL HIGH (ref <0.031)
Troponin I: 0.21 ng/mL — ABNORMAL HIGH (ref <0.031)

## 2015-03-24 LAB — PROTIME-INR
INR: 1.15 (ref 0.00–1.49)
PROTHROMBIN TIME: 14.9 s (ref 11.6–15.2)

## 2015-03-24 LAB — MRSA PCR SCREENING: MRSA by PCR: NEGATIVE

## 2015-03-24 LAB — TSH: TSH: 0.496 u[IU]/mL (ref 0.350–4.500)

## 2015-03-24 SURGERY — LEFT HEART CATH AND CORS/GRAFTS ANGIOGRAPHY

## 2015-03-24 MED ORDER — NITROGLYCERIN 1 MG/10 ML FOR IR/CATH LAB
INTRA_ARTERIAL | Status: AC
  Filled 2015-03-24: qty 10

## 2015-03-24 MED ORDER — METHYLPREDNISOLONE SODIUM SUCC 125 MG IJ SOLR
125.0000 mg | INTRAMUSCULAR | Status: AC
  Administered 2015-03-24: 125 mg via INTRAVENOUS

## 2015-03-24 MED ORDER — ONDANSETRON HCL 4 MG/2ML IJ SOLN: 4.0000 mg | Freq: Four times a day (QID) | INTRAMUSCULAR | Status: DC | PRN

## 2015-03-24 MED ORDER — DULOXETINE HCL 60 MG PO CPEP
60.0000 mg | ORAL_CAPSULE | Freq: Every morning | ORAL | Status: DC
  Administered 2015-03-25 – 2015-03-26 (×2): 60 mg via ORAL
  Filled 2015-03-24 (×2): qty 1

## 2015-03-24 MED ORDER — PRASUGREL HCL 10 MG PO TABS
10.0000 mg | ORAL_TABLET | Freq: Every day | ORAL | Status: DC
  Administered 2015-03-25: 10 mg via ORAL
  Filled 2015-03-24: qty 1

## 2015-03-24 MED ORDER — DIPHENHYDRAMINE HCL 50 MG/ML IJ SOLN
25.0000 mg | INTRAMUSCULAR | Status: AC
  Administered 2015-03-24: 25 mg via INTRAVENOUS
  Filled 2015-03-24: qty 1

## 2015-03-24 MED ORDER — FAMOTIDINE IN NACL 20-0.9 MG/50ML-% IV SOLN
20.0000 mg | INTRAVENOUS | Status: AC
  Administered 2015-03-24: 20 mg via INTRAVENOUS
  Filled 2015-03-24: qty 50

## 2015-03-24 MED ORDER — NITROGLYCERIN 0.4 MG SL SUBL
0.4000 mg | SUBLINGUAL_TABLET | SUBLINGUAL | Status: DC | PRN
  Administered 2015-03-25 (×2): 0.4 mg via SUBLINGUAL
  Filled 2015-03-24: qty 1

## 2015-03-24 MED ORDER — SODIUM CHLORIDE 0.9 % IV SOLN
INTRAVENOUS | Status: DC
  Administered 2015-03-24: 11:00:00 via INTRAVENOUS

## 2015-03-24 MED ORDER — SODIUM CHLORIDE 0.9 % IV SOLN: INTRAVENOUS | Status: AC

## 2015-03-24 MED ORDER — SODIUM CHLORIDE 0.9 % IV SOLN: 250.0000 mL | INTRAVENOUS | Status: DC | PRN

## 2015-03-24 MED ORDER — MIDAZOLAM HCL 2 MG/2ML IJ SOLN
INTRAMUSCULAR | Status: DC | PRN
  Administered 2015-03-24: 1 mg via INTRAVENOUS

## 2015-03-24 MED ORDER — ETHOSUXIMIDE 250 MG PO CAPS: 250.0000 mg | ORAL_CAPSULE | ORAL | Status: DC

## 2015-03-24 MED ORDER — ACETAMINOPHEN 325 MG PO TABS: 650.0000 mg | ORAL_TABLET | ORAL | Status: DC | PRN

## 2015-03-24 MED ORDER — HEPARIN BOLUS VIA INFUSION
4000.0000 [IU] | Freq: Once | INTRAVENOUS | Status: AC
Start: 2015-03-24 — End: 2015-03-24
  Administered 2015-03-24: 4000 [IU] via INTRAVENOUS
  Filled 2015-03-24: qty 4000

## 2015-03-24 MED ORDER — SODIUM CHLORIDE 0.9 % IJ SOLN
3.0000 mL | Freq: Two times a day (BID) | INTRAMUSCULAR | Status: DC
  Administered 2015-03-25: 3 mL via INTRAVENOUS

## 2015-03-24 MED ORDER — HEPARIN (PORCINE) IN NACL 100-0.45 UNIT/ML-% IJ SOLN
1150.0000 [IU]/h | INTRAMUSCULAR | Status: DC
  Administered 2015-03-24: 1150 [IU]/h via INTRAVENOUS
  Filled 2015-03-24: qty 250

## 2015-03-24 MED ORDER — METHYLPREDNISOLONE SODIUM SUCC 125 MG IJ SOLR
125.0000 mg | INTRAMUSCULAR | Status: DC
  Filled 2015-03-24: qty 2

## 2015-03-24 MED ORDER — LEVOTHYROXINE SODIUM 50 MCG PO TABS
50.0000 ug | ORAL_TABLET | Freq: Every day | ORAL | Status: DC
Start: 2015-03-25 — End: 2015-03-26
  Administered 2015-03-25 – 2015-03-26 (×2): 50 ug via ORAL
  Filled 2015-03-24 (×2): qty 1

## 2015-03-24 MED ORDER — CARBAMAZEPINE ER 400 MG PO TB12
500.0000 mg | ORAL_TABLET | Freq: Two times a day (BID) | ORAL | Status: DC
  Administered 2015-03-24 – 2015-03-26 (×4): 500 mg via ORAL
  Filled 2015-03-24 (×7): qty 1

## 2015-03-24 MED ORDER — VITAMIN D-3 25 MCG (1000 UT) PO CAPS: 1000.0000 [IU] | ORAL_CAPSULE | Freq: Every day | ORAL | Status: DC

## 2015-03-24 MED ORDER — LIDOCAINE HCL (PF) 1 % IJ SOLN
INTRAMUSCULAR | Status: DC | PRN
  Administered 2015-03-24: 14:00:00

## 2015-03-24 MED ORDER — ETHOSUXIMIDE 250 MG PO CAPS
500.0000 mg | ORAL_CAPSULE | Freq: Two times a day (BID) | ORAL | Status: DC
  Administered 2015-03-24 – 2015-03-25 (×2): 500 mg via ORAL
  Filled 2015-03-24 (×8): qty 2

## 2015-03-24 MED ORDER — HEPARIN (PORCINE) IN NACL 2-0.9 UNIT/ML-% IJ SOLN
INTRAMUSCULAR | Status: DC | PRN
  Administered 2015-03-24: 14:00:00 via INTRA_ARTERIAL

## 2015-03-24 MED ORDER — IOHEXOL 350 MG/ML SOLN
INTRAVENOUS | Status: DC | PRN
  Administered 2015-03-24: 85 mL via INTRACARDIAC

## 2015-03-24 MED ORDER — SODIUM CHLORIDE 0.9 % IJ SOLN: 3.0000 mL | INTRAMUSCULAR | Status: DC | PRN

## 2015-03-24 MED ORDER — PRASUGREL HCL 10 MG PO TABS
30.0000 mg | ORAL_TABLET | Freq: Once | ORAL | Status: AC
  Administered 2015-03-24: 30 mg via ORAL
  Filled 2015-03-24: qty 3

## 2015-03-24 MED ORDER — FENTANYL CITRATE (PF) 100 MCG/2ML IJ SOLN
INTRAMUSCULAR | Status: DC | PRN
  Administered 2015-03-24: 25 ug via INTRAVENOUS

## 2015-03-24 MED ORDER — LIDOCAINE HCL (PF) 1 % IJ SOLN
INTRAMUSCULAR | Status: AC
  Filled 2015-03-24: qty 30

## 2015-03-24 MED ORDER — MIDAZOLAM HCL 2 MG/2ML IJ SOLN
INTRAMUSCULAR | Status: AC
  Filled 2015-03-24: qty 4

## 2015-03-24 MED ORDER — EZETIMIBE 10 MG PO TABS
10.0000 mg | ORAL_TABLET | Freq: Every day | ORAL | Status: DC
  Administered 2015-03-24 – 2015-03-25 (×2): 10 mg via ORAL
  Filled 2015-03-24 (×2): qty 1

## 2015-03-24 MED ORDER — ETHOSUXIMIDE 250 MG PO CAPS
250.0000 mg | ORAL_CAPSULE | Freq: Every day | ORAL | Status: DC
  Administered 2015-03-25 – 2015-03-26 (×2): 250 mg via ORAL
  Filled 2015-03-24 (×5): qty 1

## 2015-03-24 MED ORDER — FENTANYL CITRATE (PF) 100 MCG/2ML IJ SOLN
INTRAMUSCULAR | Status: AC
  Filled 2015-03-24: qty 4

## 2015-03-24 MED ORDER — HEPARIN (PORCINE) IN NACL 2-0.9 UNIT/ML-% IJ SOLN
INTRAMUSCULAR | Status: AC
  Filled 2015-03-24: qty 1000

## 2015-03-24 SURGICAL SUPPLY — 11 items
CATH INFINITI 5FR MULTPACK ANG (CATHETERS) ×2 IMPLANT
CATH OPTITORQUE TIG 4.0 5F (CATHETERS) ×2 IMPLANT
GLIDESHEATH SLEND SS 6F .021 (SHEATH) ×3 IMPLANT
KIT HEART LEFT (KITS) ×3 IMPLANT
PACK CARDIAC CATHETERIZATION (CUSTOM PROCEDURE TRAY) ×3 IMPLANT
SHEATH PINNACLE 5F 10CM (SHEATH) ×2 IMPLANT
SYR MEDRAD MARK V 150ML (SYRINGE) ×3 IMPLANT
TRANSDUCER W/STOPCOCK (MISCELLANEOUS) ×3 IMPLANT
TUBING CIL FLEX 10 FLL-RA (TUBING) ×3 IMPLANT
WIRE EMERALD 3MM-J .035X150CM (WIRE) ×2 IMPLANT
WIRE SAFE-T 1.5MM-J .035X260CM (WIRE) ×3 IMPLANT

## 2015-03-24 NOTE — Progress Notes (Signed)
Site area: RFA Site Prior to Removal:  Level 0 Pressure Applied For:20 min Manual:   yes Patient Status During Pull:  stable Post Pull Site:  Level 0 Post Pull Instructions Given:  done Post Pull Pulses Present:palpable  Dressing Applied:  clear Bedrest begins @ 1510 till 1940 Comments:

## 2015-03-24 NOTE — Progress Notes (Signed)
ANTICOAGULATION CONSULT NOTE - Initial Consult  Pharmacy Consult for heparin Indication: chest pain/ACS  Allergies  Allergen Reactions  . Aspirin Other (See Comments)    GI upset  . Erythromycin     Gi upset  . Imdur [Isosorbide Mononitrate]     Pt does not recall reaction  . Lisinopril     cough  . Niacin And Related     Pt does not recall reactions   . Penicillins     Gi upset  . Statins     Muscle pain  . Sulfa Drugs Cross Reactors Swelling  . Tetracyclines & Related     Patient Measurements:   Heparin Dosing Weight: 84kg  Vital Signs: BP: 132/80 mmHg (09/21 0805) Pulse Rate: 64 (09/21 0805)  Labs: No results for input(s): HGB, HCT, PLT, APTT, LABPROT, INR, HEPARINUNFRC, CREATININE, CKTOTAL, CKMB, TROPONINI in the last 72 hours.  Estimated Creatinine Clearance: 82.7 mL/min (by C-G formula based on Cr of 0.65).   Medical History: Past Medical History  Diagnosis Date  . Coronary artery disease     a. remote NSTEMI with PCI to the RCA; b. S/P CABG in 2007; c. 2010 - 2 v CAD with L-LAD and S-RCA patent, small caliber Dx 70% (treated medically - no amenable to PCI), CFX free of significant disease, normal LVF. d. 10/2014 Lexi MV: EF 61%, no ischemia/infarct.  . Dyslipidemia   . Monoclonal gammopathy of unknown significance   . Colon cancer 12/04/2011    s/p Laparoscopic-assisted transverse colectomy on 12/19/2011 by Dr. Donne Hazel.  pT3 N0 M0.   Marland Kitchen Hyperlipidemia   . PONV (postoperative nausea and vomiting)   . Fibromyalgia   . GERD (gastroesophageal reflux disease)   . Gastric ulcer   . Hypothyroid   . Depression   . Wears glasses   . Family history of adverse reaction to anesthesia     "son gets PONV"  . Iron deficiency anemia 11/17/2011  . MBEMLJQG(920.1)     "maybe monthly" (10/26/2014)  . 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 Arthritis     hands (10/26/2014)   Assessment: 64 year old female with  hx of CAD presents to [facility] with chest pain. Patient seen last week for r/o acs in which she ruled out and declined observation. She has asa and statin intolerance. Deferring antiplatelet regimen to cath team. She was not taking any anticoagulants pta, CBC appears normal.  Goal of Therapy:  Heparin level 0.3-0.7 units/ml Monitor platelets by anticoagulation protocol: Yes   Plan:  Give 4000 units bolus x 1 Start heparin infusion at 1150 units/hr Check anti-Xa level in 6 hours and daily while on heparin Continue to monitor H&H and platelets   Erin Hearing PharmD., BCPS Clinical Pharmacist Pager [phone] 03/24/2015 9:44 AM

## 2015-03-24 NOTE — H&P (Signed)
Cardiology H&P  Date: 03/24/2015  Patient ID: Araseli, Sherry 04-07-1951, MRN [id number] PCP: Vidal Schwalbe, MD Cardiologist: Dr. [name]  Chief Complaint: f/u ER visit for chest pain  History of Present Illness: KATHELENE RUMBERGER is a 64 y.o. female with history of CAD (remote NSTEMI s/p PCI to RCA, s/p CABG 2007), ASA intolerance due to GI upset, HL with statin intolerance, colon CA s/p resection and chemotherapy, seizure, fibromyalgia, GERD, hypothyroidism, former smoker (quit 48 y/a) who presents for post-ER follow-up. Last cath in 2010 - 2 v CAD with L-LAD and S-RCA patent, small caliber Dx 70% (treated medically - no amenable to PCI), CFX free of significant disease, normal LVF. She had a nuclear study in 10/2014 which showed no evidence of ischemia with normal LV function.She was seen in lipid clinic and given Livalo. She took it one day and didn't feel right so she stopped it. She was doing well on cardiac standpoint when seen in the office by Dr. [name] 01/2015. She was more recently seen in the ER 9/15/6 with an episode of chest pressure/heaviness while sitting at work - it was somewhat similar to prior cardiac pain but less severe. She took 1 SL NTG and drove herself to the ER. In ED, POC trop in negative, lytes OK except mildly elevated glucose 124 and Na 134, CBC normal, CXR no acute cardiopulmonary disease; low lung volume. EKG with NSR at rate of 102bpm, nonspecific ST and T wave abnormalities (slight ST sagging in I, avL, V2). She was given SL nitro x 2 in ED and Iv zofran. Her chest pressure improved from 5/10 to 2/10. Admission for observation and rule-out was recommended, however, she declined and wanted to go home. She reported being under a lot of stress - husband, son, and dog just had surgery. Outpatient echo with same-day appointment was arranged for today.  However, this AM when coming into clinic she developed chest tightness/pressure similar to her  prior angina. It started about 15 minutes ago. It is not worse with inspiration or palpation. She is unable to tell me if it is worse with exertion. She also feels very lightheaded. VSS in the office today and EKG nonacute. Once her husband was back in the room, he mentioned that she had passed out yesterday which she initially did not report. She admits she was sitting at her desk yesterday when she suddenly woke up with people all around her. She was still sitting up, so she says no idea how long she was actually out for. She had no preceding symptoms. Afterwards she came right to. No bowel/bladder incontinence or reported seizure activity (h/o seizure 43 years ago but none since). She also says she has been off her Effient for unknown period of time, but not for any particular reason. She was supposed to be on this per 01/2015 office note. No LEE, palpitations, bleeding, recent surgery/travel/bedrest.   Past Medical History  Diagnosis Date  . Coronary artery disease     a. remote NSTEMI with PCI to the RCA; b. S/P CABG in 2007; c. 2010 - 2 v CAD with L-LAD and S-RCA patent, small caliber Dx 70% (treated medically - no amenable to PCI), CFX free of significant disease, normal LVF. d. 10/2014 Lexi MV: EF 61%, no ischemia/infarct.  . Dyslipidemia   . Monoclonal gammopathy of unknown significance   . Colon cancer 12/04/2011    s/p Laparoscopic-assisted transverse colectomy on 12/19/2011 by Dr. Donne Hazel. pT3 N0 M0.   Marland Kitchen  Hyperlipidemia   . PONV (postoperative nausea and vomiting)   . Fibromyalgia   . GERD (gastroesophageal reflux disease)   . Gastric ulcer   . Hypothyroid   . Depression   . Wears glasses   . Family history of adverse reaction to anesthesia     "son gets PONV"  . Iron deficiency anemia 11/17/2011  . WUJWJXBJ(478.2)     "maybe monthly" (10/26/2014)  . 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 Arthritis     hands (10/26/2014)    Past Surgical History  Procedure Laterality Date  . Posterior laminectomy / decompression cervical spine      20+yrs ago  . Colonoscopy    . Colon resection  12/19/2011    Procedure: COLON RESECTION LAPAROSCOPIC; Surgeon: Rolm Bookbinder, MD; Location: [place]; Service: General; Laterality: N/A; laparoscopic hand assisted partial colon resection  . Portacath placement  06/04/2012    Procedure: INSERTION PORT-A-CATH; Surgeon: Rolm Bookbinder, MD; Location: [place]; Service: General; Laterality: N/A; Insertion of port-a-cath   . Port-a-cath removal N/A 06/12/2013    Procedure: REMOVAL PORT-A-CATH; Surgeon: Rolm Bookbinder, MD; Location: [place]; Service: General; Laterality: N/A;  . Colon surgery    . Anterior cervical decomp/discectomy fusion  1980's  . Back surgery    . Vaginal hysterectomy    . Dilation and curettage of uterus  1973  . Coronary artery bypass graft  2007    x 2  . Cardiac catheterization  05/20/2009    obstructive native vessel disease in LAD, RCA, and first diagonal, patent vein graft to distal RCA and LIMA to LAD,normal. ef 60%  . Coronary angioplasty with stent placement  ~ 2007    1 stent    Current Outpatient Prescriptions  Medication Sig Dispense Refill  . Azelastine HCl 0.15 % SOLN Place 1 spray into the nose as needed (allergies).   12  . carbamazepine (TEGRETOL XR) 100 MG 12 hr tablet TAKE 5 TABLETS BY MOUTH TWICE A DAY 300 tablet 0  . Cholecalciferol (VITAMIN D-3) 1000 UNITS CAPS Take 1,000 Units by mouth daily.    . diphenhydrAMINE (BENADRYL) 25 MG tablet Take 25 mg by mouth every 6 (six) hours as needed. For allergies.    . DULoxetine (CYMBALTA) 60 MG capsule Take 60 mg by mouth every morning.    Marland Kitchen ethosuximide (ZARONTIN) 250 MG capsule  TAKE ONE CAPSULE BY MOUTH IN THE MORNING THEN 2 CAPSULES IN THE AFTERNOON AND 2 CAPSULES AT NIGHT 150 capsule 11  . ezetimibe (ZETIA) 10 MG tablet Take 1 tablet (10 mg total) by mouth daily. 30 tablet 3  . levothyroxine (SYNTHROID, LEVOTHROID) 50 MCG tablet Take 50 mcg by mouth daily.     Marland Kitchen loratadine (CLARITIN) 10 MG tablet Take 10 mg by mouth daily as needed for allergies.    Marland Kitchen MINIVELLE 0.05 MG/24HR patch Apply 1 patch topically. Twice monthly  12  . nitroGLYCERIN (NITROSTAT) 0.4 MG SL tablet Place 1 tablet (0.4 mg total) under the tongue every 5 (five) minutes as needed. For chest pain. 25 tablet 1  . tiZANidine (ZANAFLEX) 2 MG tablet Take 1 tablet by mouth as needed for muscle spasms.   2   No current facility-administered medications for this visit.    Allergies: Aspirin; Erythromycin; Imdur; Lisinopril; Niacin and related; Penicillins; Statins; Sulfa drugs cross reactors; and Tetracyclines & related   Social History: The patient  reports that she quit  smoking about 33 years ago. Her smoking use included Cigarettes. She has a 6 pack-year smoking history. She has never used smokeless tobacco. She reports that she drinks about 0.6 oz of alcohol per week. She reports that she does not use illicit drugs.   Family History: The patient's family history includes Cancer in her mother; Heart attack in her father; Heart attack (age of onset: 5) in her brother; Heart disease in her father. There is no history of Stroke.  ROS: Please see the history of present illness.  All other systems are reviewed and otherwise negative.   PHYSICAL EXAM:  VS: BP 132/80 mmHg  Pulse 64  Ht 5\' 9"  (1.753 m)  Wt 192 lb 9.6 oz (87.363 kg)  BMI 28.43 kg/m2  SpO2 98% BMI: Body mass index is 28.43 kg/(m^2). Well nourished, well developed WF in no acute distress  HEENT: normocephalic, atraumatic  Neck: no JVD, carotid bruits or masses Cardiac: normal S1, S2; RRR; no  murmurs, rubs, or gallops Lungs: clear to auscultation bilaterally, no wheezing, rhonchi or rales  Abd: soft, nontender, no hepatomegaly, + BS MS: no deformity or atrophy Ext: no edema  Skin: warm and dry, no rash Neuro: moves all extremities spontaneously, no focal abnormalities noted, follows commands Psych: euthymic mood, full affect   EKG: Done today shows NSR 64bpm, TWI in avL/V2 otherwise no acute changes.  Recent Labs: 10/26/2014: Magnesium 2.2; TSH 0.822 01/12/2015: ALT 13 03/18/2015: BUN 7; Creatinine, Ser 0.65; Hemoglobin 13.9; Platelets 316; Potassium 4.4; Sodium 134*  01/12/2015: Cholesterol 256*; HDL 71.30; LDL Cholesterol 163*; Total CHOL/HDL Ratio 4; Triglycerides 111.0; VLDL 22.2   Estimated Creatinine Clearance: 82.7 mL/min (by C-G formula based on Cr of 0.65).   Wt Readings from Last 3 Encounters:  03/24/15 192 lb 9.6 oz (87.363 kg)  02/22/15 190 lb (86.183 kg)  01/14/15 194 lb (87.998 kg)     Other studies reviewed: Additional studies/records reviewed today include: summarized above  ASSESSMENT AND PLAN:  1. Chest pain with history of CAD as above - this is now episode #2 at rest. EKG while in the ER showed nonspecific ST sagging. Although EKG is now nonacute today, symptoms are concerning for recurrent angina and will require definitive evaluation. Discussed case with Dr. Ron Parker who saw the patient with me. Given ongoing symptoms, we have recommended admission for rule-out and cardiac cath. We do not feel an echo alone will give [place] the answers we need. Risks and benefits of cardiac catheterization have been discussed with the patient. These include bleeding, infection, kidney damage, stroke, heart attack, death. The patient understands these risks and is willing to proceed. Since she is currently feeling lightheaded we will avoid giving SL NTG until she is hooked up to the monitor - she reports current discomfort is very mild now. Will place her on  heparin per pharmacy. Transfer by EMS was recommended but patient declined, accepting risks, and signed form. Discussed issue of Effient with Dr. Ron Parker - she reports she is unable to take aspirin. We will defer to the cathing team about chose of antiplatelet agent/loading at time of cath. We will also premedicate her with Solu-Medrol, Pepcid and Benadryl as she thinks she has a prior history of dye allergy many years ago but is not completely sure. 2. Syncope - unclear etiology but concerning for cardiac arrhythmia given abrupt nature. Monitor on telemetry. May need event monitoring at discharge. Check labs. Will order echo for after cardiac cath. Depending on results would consider neurology  evaluation given remote history of seizure. 3. Hyperlipidemia - per notes, intolerant of statins and does not wish to try a PCSK-9 inhibitor. Continue Zetia. 4. Hyperglycemia - check A1C in the hospital -will defer further f/u to PCP. 5. Hypothyroidism - check TSH.  Disposition: F/u TBD after hospitalization.  Current medicines are reviewed at length with the patient today. The patient did not have any concerns regarding medicines.  Raechel Ache PA-C 03/24/2015 8:38 AM   [address] [place] [address] [phone] (office)  [phone] (fax)     Patient seen and examined. I agree with the assessment and plan as detailed above. See also my additional thoughts below.   I spoke with the patient in person. She has known coronary disease. She's having recurrent symptoms. We need to proceed with admission and catheterization.  Dola Argyle, MD, [facility] 03/24/2015 12:06 PM

## 2015-03-24 NOTE — Care Management Note (Signed)
Case Management Note  Patient Details  Name: Rachel Clark MRN: [id number] Date of Birth: 01-05-1951  Subjective/Objective:   Pt in cath lab, md notes states will be on effient                 Action/Plan: lives w husband, pcp dr [name] white   Expected Discharge Date:                 Expected Discharge Plan:     In-House Referral:     Discharge planning Services  CM Consult, Medication Assistance  Post Acute Care Choice:    Choice offered to:     DME Arranged:    DME Agency:     HH Arranged:    [place] Agency:     Status of Service:     Medicare Important Message Given:    Date Medicare IM Given:    Medicare IM give by:    Date Additional Medicare IM Given:    Additional Medicare Important Message give by:     If discussed at [place] of Stay Meetings, dates discussed:    Additional Comments: left pt effient 30day free and copay assist card. Pt has bcbs ins.  Lacretia Leigh, RN 03/24/2015, 3:21 PM

## 2015-03-24 NOTE — Patient Instructions (Addendum)
Medication Instructions:  Your physician recommends that you continue on your current medications as directed. Please refer to the Current Medication list given to you today.    Labwork: None ordered  Testing/Procedures: None ordered  Follow-Up: You are being direct admitted to [facility] today.  Report to ADMITTING @ [facility] [facility])

## 2015-03-25 ENCOUNTER — Ambulatory Visit (HOSPITAL_COMMUNITY): Payer: BLUE CROSS/BLUE SHIELD

## 2015-03-25 ENCOUNTER — Encounter (HOSPITAL_COMMUNITY)
Admission: AD | Disposition: A | Discharge status: Home / Self Care | Payer: BLUE CROSS/BLUE SHIELD | Source: Ambulatory Visit | Attending: Cardiology

## 2015-03-25 ENCOUNTER — Encounter (HOSPITAL_COMMUNITY): Payer: Self-pay | Admitting: Cardiovascular Disease

## 2015-03-25 ENCOUNTER — Other Ambulatory Visit: Payer: Self-pay

## 2015-03-25 DIAGNOSIS — I25810 Atherosclerosis of coronary artery bypass graft(s) without angina pectoris: Secondary | ICD-10-CM

## 2015-03-25 DIAGNOSIS — I442 Atrioventricular block, complete: Secondary | ICD-10-CM | POA: Diagnosis present

## 2015-03-25 DIAGNOSIS — R55 Syncope and collapse: Secondary | ICD-10-CM

## 2015-03-25 HISTORY — PX: EP IMPLANTABLE DEVICE: SHX172B

## 2015-03-25 LAB — CBC
HCT: 37.1 % (ref 36.0–46.0)
Hemoglobin: 12.4 g/dL (ref 12.0–15.0)
MCH: 29.3 pg (ref 26.0–34.0)
MCHC: 33.4 g/dL (ref 30.0–36.0)
MCV: 87.7 fL (ref 78.0–100.0)
PLATELETS: 302 10*3/uL (ref 150–400)
RBC: 4.23 MIL/uL (ref 3.87–5.11)
RDW: 13 % (ref 11.5–15.5)
WBC: 9.2 10*3/uL (ref 4.0–10.5)

## 2015-03-25 LAB — COMPREHENSIVE METABOLIC PANEL
ALK PHOS: 68 U/L (ref 38–126)
ALT: 15 U/L (ref 14–54)
AST: 13 U/L — AB (ref 15–41)
Albumin: 3.1 g/dL — ABNORMAL LOW (ref 3.5–5.0)
Anion gap: 7 (ref 5–15)
BILIRUBIN TOTAL: 0.3 mg/dL (ref 0.3–1.2)
BUN: 11 mg/dL (ref 6–20)
CALCIUM: 8.4 mg/dL — AB (ref 8.9–10.3)
CO2: 26 mmol/L (ref 22–32)
Chloride: 103 mmol/L (ref 101–111)
Creatinine, Ser: 0.64 mg/dL (ref 0.44–1.00)
GFR calc Af Amer: >60 mL/min (ref >60)
GLUCOSE: 116 mg/dL — AB (ref 65–99)
Potassium: 3.9 mmol/L (ref 3.5–5.1)
Sodium: 136 mmol/L (ref 135–145)
Total Protein: 6.4 g/dL — ABNORMAL LOW (ref 6.5–8.1)

## 2015-03-25 LAB — HEMOGLOBIN A1C
HEMOGLOBIN A1C: 5.9 % — AB (ref 4.8–5.6)
MEAN PLASMA GLUCOSE: 123 mg/dL

## 2015-03-25 LAB — LIPID PANEL
CHOLESTEROL: 248 mg/dL — AB (ref 0–200)
HDL: 65 mg/dL (ref >40)
LDL Cholesterol: 164 mg/dL — ABNORMAL HIGH (ref 0–99)
TRIGLYCERIDES: 94 mg/dL (ref <150)
Total CHOL/HDL Ratio: 3.8 RATIO
VLDL: 19 mg/dL (ref 0–40)

## 2015-03-25 LAB — POCT ACTIVATED CLOTTING TIME: Activated Clotting Time: 79 seconds

## 2015-03-25 SURGERY — PACEMAKER IMPLANT
Anesthesia: LOCAL

## 2015-03-25 MED ORDER — IOHEXOL 350 MG/ML SOLN
INTRAVENOUS | Status: DC | PRN
  Administered 2015-03-25: 20 mL via INTRAVENOUS

## 2015-03-25 MED ORDER — METHYLPREDNISOLONE SODIUM SUCC 125 MG IJ SOLR
125.0000 mg | INTRAMUSCULAR | Status: AC
  Administered 2015-03-25: 125 mg via INTRAVENOUS
  Filled 2015-03-25: qty 2

## 2015-03-25 MED ORDER — SODIUM CHLORIDE 0.9 % IR SOLN
80.0000 mg | Status: DC
  Filled 2015-03-25: qty 2

## 2015-03-25 MED ORDER — ACETAMINOPHEN 325 MG PO TABS: 325.0000 mg | ORAL_TABLET | ORAL | Status: DC | PRN

## 2015-03-25 MED ORDER — FENTANYL CITRATE (PF) 100 MCG/2ML IJ SOLN
INTRAMUSCULAR | Status: DC | PRN
  Administered 2015-03-25: 12.5 ug via INTRAVENOUS
  Administered 2015-03-25: 25 ug via INTRAVENOUS

## 2015-03-25 MED ORDER — SODIUM CHLORIDE 0.9 % IV SOLN
1.0000 g | INTRAVENOUS | Status: DC | PRN
  Administered 2015-03-25 (×2): 1 g via INTRAVENOUS

## 2015-03-25 MED ORDER — GENTAMICIN SULFATE 40 MG/ML IJ SOLN
INTRAMUSCULAR | Status: AC
  Filled 2015-03-25: qty 2

## 2015-03-25 MED ORDER — HEPARIN (PORCINE) IN NACL 2-0.9 UNIT/ML-% IJ SOLN
INTRAMUSCULAR | Status: AC
  Filled 2015-03-25: qty 500

## 2015-03-25 MED ORDER — FAMOTIDINE IN NACL 20-0.9 MG/50ML-% IV SOLN
20.0000 mg | INTRAVENOUS | Status: AC
  Administered 2015-03-25: 20 mg via INTRAVENOUS
  Filled 2015-03-25: qty 50

## 2015-03-25 MED ORDER — MIDAZOLAM HCL 5 MG/5ML IJ SOLN
INTRAMUSCULAR | Status: DC | PRN
  Administered 2015-03-25 (×3): 1 mg via INTRAVENOUS

## 2015-03-25 MED ORDER — CHLORHEXIDINE GLUCONATE 4 % EX LIQD
CUTANEOUS | Status: AC
  Filled 2015-03-25: qty 15

## 2015-03-25 MED ORDER — DIPHENHYDRAMINE HCL 50 MG/ML IJ SOLN
25.0000 mg | INTRAMUSCULAR | Status: AC
  Administered 2015-03-25: 25 mg via INTRAVENOUS
  Filled 2015-03-25: qty 1

## 2015-03-25 MED ORDER — SODIUM CHLORIDE 0.9 % IJ SOLN
3.0000 mL | Freq: Two times a day (BID) | INTRAMUSCULAR | Status: DC
  Administered 2015-03-25: 3 mL via INTRAVENOUS

## 2015-03-25 MED ORDER — SODIUM CHLORIDE 0.9 % IV SOLN
INTRAVENOUS | Status: DC
Start: 2015-03-25 — End: 2015-03-25
  Administered 2015-03-25: 13:00:00 via INTRAVENOUS

## 2015-03-25 MED ORDER — SODIUM CHLORIDE 0.9 % IJ SOLN: 3.0000 mL | INTRAMUSCULAR | Status: DC | PRN

## 2015-03-25 MED ORDER — MIDAZOLAM HCL 5 MG/5ML IJ SOLN
INTRAMUSCULAR | Status: AC
  Filled 2015-03-25: qty 25

## 2015-03-25 MED ORDER — SODIUM CHLORIDE 0.45 % IV SOLN
INTRAVENOUS | Status: DC
Start: 2015-03-25 — End: 2015-03-25

## 2015-03-25 MED ORDER — CHLORHEXIDINE GLUCONATE 4 % EX LIQD
60.0000 mL | Freq: Once | CUTANEOUS | Status: AC
  Administered 2015-03-25: 4 via TOPICAL

## 2015-03-25 MED ORDER — CEFAZOLIN SODIUM 1-5 GM-% IV SOLN
1.0000 g | INTRAVENOUS | Status: DC
  Filled 2015-03-25: qty 50

## 2015-03-25 MED ORDER — VANCOMYCIN HCL IN DEXTROSE 1-5 GM/200ML-% IV SOLN
1000.0000 mg | INTRAVENOUS | Status: DC
  Filled 2015-03-25 (×2): qty 200

## 2015-03-25 MED ORDER — LIDOCAINE HCL (PF) 1 % IJ SOLN
INTRAMUSCULAR | Status: AC
  Filled 2015-03-25: qty 60

## 2015-03-25 MED ORDER — VANCOMYCIN HCL IN DEXTROSE 1-5 GM/200ML-% IV SOLN
1000.0000 mg | Freq: Two times a day (BID) | INTRAVENOUS | Status: AC
  Administered 2015-03-26: 1000 mg via INTRAVENOUS
  Filled 2015-03-25: qty 200

## 2015-03-25 MED ORDER — FENTANYL CITRATE (PF) 100 MCG/2ML IJ SOLN
INTRAMUSCULAR | Status: AC
  Filled 2015-03-25: qty 4

## 2015-03-25 MED ORDER — LIDOCAINE HCL (PF) 1 % IJ SOLN
INTRAMUSCULAR | Status: DC | PRN
  Administered 2015-03-25: 17:00:00

## 2015-03-25 MED ORDER — YOU HAVE A PACEMAKER BOOK
Freq: Once | Status: AC
  Administered 2015-03-25: 21:00:00
  Filled 2015-03-25: qty 1

## 2015-03-25 MED ORDER — ONDANSETRON HCL 4 MG/2ML IJ SOLN: 4.0000 mg | Freq: Four times a day (QID) | INTRAMUSCULAR | Status: DC | PRN

## 2015-03-25 MED ORDER — SODIUM CHLORIDE 0.9 % IV SOLN: 250.0000 mL | INTRAVENOUS | Status: DC | PRN

## 2015-03-25 MED ORDER — HYDROCODONE-ACETAMINOPHEN 5-325 MG PO TABS: 1.0000 | ORAL_TABLET | ORAL | Status: DC | PRN

## 2015-03-25 SURGICAL SUPPLY — 7 items
CABLE SURGICAL S-101-97-12 (CABLE) ×1 IMPLANT
LEAD CAPSURE NOVUS 5076-52CM (Lead) ×1 IMPLANT
LEAD CAPSURE NOVUS 5076-58CM (Lead) ×1 IMPLANT
PAD DEFIB LIFELINK (PAD) ×1 IMPLANT
PPM ADVISA MRI DR A2DR01 (Pacemaker) ×1 IMPLANT
SHEATH CLASSIC 7F (SHEATH) ×2 IMPLANT
TRAY PACEMAKER INSERTION (CUSTOM PROCEDURE TRAY) ×1 IMPLANT

## 2015-03-25 NOTE — Consult Note (Signed)
ELECTROPHYSIOLOGY CONSULT NOTE    Patient ID: Rachel Clark MRN: [id number], DOB/AGE: 09/05/1950 64 y.o.  Admit date: 03/24/2015 Date of Consult: @TODAY @  Primary Physician: Vidal Schwalbe, MD Primary Cardiologist: Dr. Ron Parker Electrophysiologist: Dr. Rayann Heman  Reason for Consultation:  [facility]  HPI: Rachel Clark is a 64 y.o. female came in to the hospital for recurrent angina with known CAD, she had a recent ER visit with the same associated with syncope at work which she declined admission at that time (03/18/15).  This admission she underwent cardiac cath with CAD not ammenable for revascularization.  This morning she was observed to have prolonged episodes as long as 15 seconds of high degree AVBlock, with syncope, staff was not able to arouse her until her SR was restored.  She is currently resting in bed, feeling well without CP.  Past Medical History  Diagnosis Date  . Coronary artery disease     a. remote NSTEMI with PCI to the RCA; b. S/P CABG in 2007; c. 2010 - 2 v CAD with L-LAD and S-RCA patent, small caliber Dx 70% (treated medically - no amenable to PCI), CFX free of significant disease, normal LVF. d. 10/2014 Lexi MV: EF 61%, no ischemia/infarct.  . Dyslipidemia   . Monoclonal gammopathy of unknown significance   . Colon cancer 12/04/2011    s/p Laparoscopic-assisted transverse colectomy on 12/19/2011 by Dr. Donne Hazel.  pT3 N0 M0.   Marland Kitchen Hyperlipidemia   . PONV (postoperative nausea and vomiting)   . Fibromyalgia   . GERD (gastroesophageal reflux disease)   . Gastric ulcer   . Hypothyroid   . Depression   . Wears glasses   . Family history of adverse reaction to anesthesia     "son gets PONV"  . Iron deficiency anemia 11/17/2011  . XNATFTDD(220.2)     "maybe monthly" (10/26/2014)  . 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 Arthritis     hands (10/26/2014)     Surgical History:  Past Surgical History    Procedure Laterality Date  . Posterior laminectomy / decompression cervical spine      20+yrs ago  . Colonoscopy    . Colon resection  12/19/2011    Procedure: COLON RESECTION LAPAROSCOPIC;  Surgeon: Rolm Bookbinder, MD;  Location: [place];  Service: General;  Laterality: N/A;  laparoscopic hand assisted partial colon resection  . Portacath placement  06/04/2012    Procedure: INSERTION PORT-A-CATH;  Surgeon: Rolm Bookbinder, MD;  Location: [place];  Service: General;  Laterality: N/A;  Insertion of port-a-cath   . Port-a-cath removal N/A 06/12/2013    Procedure: REMOVAL PORT-A-CATH;  Surgeon: Rolm Bookbinder, MD;  Location: [place];  Service: General;  Laterality: N/A;  . Colon surgery    . Anterior cervical decomp/discectomy fusion  1980's  . Back surgery    . Vaginal hysterectomy    . Dilation and curettage of uterus  1973  . Coronary artery bypass graft  2007    x 2  . Cardiac catheterization  05/20/2009    obstructive native vessel disease in LAD, RCA, and first diagonal, patent vein graft to distal RCA and LIMA to LAD,normal. ef 60%  . Coronary angioplasty with stent placement  ~ 2007    1 stent  . Cardiac catheterization N/A 03/24/2015    Procedure: Left Heart Cath and Cors/Grafts Angiography;  Surgeon: Wellington Hampshire, MD;  Location: [facility];  Service: Cardiovascular;  Laterality: N/A;     Prescriptions prior to admission  Medication Sig Dispense Refill Last Dose  . Azelastine HCl 0.15 % SOLN Place 1 spray into the nose as needed (allergies).   12 Past Week at Unknown time  . carbamazepine (TEGRETOL XR) 100 MG 12 hr tablet Take 500 mg by mouth 2 (two) times daily.   03/24/2015 at Unknown time  . DULoxetine (CYMBALTA) 60 MG capsule Take 60 mg by mouth every morning.   03/24/2015 at Unknown time  . ethosuximide (ZARONTIN) 250 MG capsule Take 250-500 mg by mouth 3 (three) times daily. Take 1 capsule (250mg ) in the morning, 2 capsules (500mg ) in the  afternoon, and 2 capsules (500mg ) at night   03/24/2015 at Unknown time  . ezetimibe (ZETIA) 10 MG tablet Take 1 tablet (10 mg total) by mouth daily. 30 tablet 3 03/23/2015 at Unknown time  . levothyroxine (SYNTHROID, LEVOTHROID) 50 MCG tablet Take 50 mcg by mouth daily.    03/24/2015 at Unknown time  . nitroGLYCERIN (NITROSTAT) 0.4 MG SL tablet Place 1 tablet (0.4 mg total) under the tongue every 5 (five) minutes as needed. For chest pain. 25 tablet 1 Past Week at Unknown time  . tiZANidine (ZANAFLEX) 2 MG tablet Take 2-4 tablets by mouth at bedtime as needed for muscle spasms.   2 03/23/2015 at Unknown time  . MINIVELLE 0.05 MG/24HR patch Apply 1 patch topically 2 (two) times a week. Monday and Thursday  12 03/22/2015    Inpatient Medications:  . carbamazepine  500 mg Oral BID  . DULoxetine  60 mg Oral q morning - 10a  . ethosuximide  250 mg Oral Daily   And  . ethosuximide  500 mg Oral BID  . ezetimibe  10 mg Oral QHS  . levothyroxine  50 mcg Oral QAC breakfast  . prasugrel  10 mg Oral Daily  . sodium chloride  3 mL Intravenous Q12H    Allergies:  Allergies  Allergen Reactions  . Aspirin Other (See Comments)    GI upset  . Erythromycin     Gi upset  . Imdur [Isosorbide Mononitrate]     Pt does not recall reaction  . Lisinopril     cough  . Niacin And Related     Pt does not recall reactions   . Penicillins     Gi upset  . Statins     Muscle pain  . Sulfa Drugs Cross Reactors Swelling  . Tetracyclines & Related     Social History   Social History  . Marital Status: Married    Spouse Name: N/A  . Number of Children: 2  . Years of Education: 12   Occupational History  . office work     unemployed   Social History Main Topics  . Smoking status: Former Smoker -- 0.30 packs/day for 20 years    Types: Cigarettes    Quit date: 06/06/1981  . Smokeless tobacco: Never Used  . Alcohol Use: 0.6 oz/week    1 Glasses of wine per week  . Drug Use: No  . Sexual Activity: Yes     Birth Control/ Protection: Surgical   Other Topics Concern  . Not on file   Social History Narrative   Patient is married with 2 children.   Patient is right handed.   Patient has a high school education with some college education.   Patient drinks 2 cups daily.     Family History  Problem Relation  Age of Onset  . Heart attack Father   . Heart disease Father   . Heart attack Brother 55  . Cancer Mother     lung  . Stroke Neg Hx      Review of Systems: General: No chills, fever, night sweats or weight changes  Cardiovascular:  No chest pain this morning, nodyspnea on exertion, edema, orthopnea, palpitations, paroxysmal nocturnal dyspnea Dermatological: No rash, lesions or masses Respiratory: No cough, dyspnea Urologic: No hematuria, dysuria Abdominal: No nausea, vomiting, diarrhea, bright red blood per rectum, melena, or hematemesis Neurologic: she was unresponsive associated with the heart block All other systems reviewed and are otherwise negative except as noted above.  Physical Exam: Filed Vitals:   03/25/15 0315 03/25/15 0400 03/25/15 0740 03/25/15 0800  BP: 120/85   122/50  Pulse:   70   Temp:  98.1 F (36.7 C)  97.6 F (36.4 C)  TempSrc:  Oral  Oral  Resp:   19 19  Weight:      SpO2:  95% 96% 96%    GEN- The patient is well appearing, alert and oriented x 3 today.   HEENT: normocephalic, atraumatic; sclera clear, conjunctiva pink; hearing intact; oropharynx clear; neck supple  Lungs- Clear to ausculation bilaterally, normal work of breathing.  No wheezes, rales, rhonchi Heart- Regular rate and rhythm, no murmurs, rubs or gallops, PMI not laterally displaced GI- soft, non-tender, non-distended, bowel sounds present Extremities- no clubbing, cyanosis, or edema; DP/PT/radial pulses 2+ bilaterally MS- no significant deformity or atrophy Skin- warm and dry, no rash or lesion Psych- euthymic mood, full affect Neuro- strength and sensation are  intact  Labs:   Lab Results  Component Value Date   WBC 9.2 03/25/2015   HGB 12.4 03/25/2015   HCT 37.1 03/25/2015   MCV 87.7 03/25/2015   PLT 302 03/25/2015    Recent Labs Lab 03/25/15 0254  NA 136  K 3.9  CL 103  CO2 26  BUN 11  CREATININE 0.64  CALCIUM 8.4*  PROT 6.4*  BILITOT 0.3  ALKPHOS 68  ALT 15  AST 13*  GLUCOSE 116*      Radiology/Studies: Dg Chest 2 View 03/18/2015   CLINICAL DATA:  Chest pain.  Dizziness.  EXAM: CHEST - 2 VIEW  COMPARISON:  Two-view chest x-ray 11/03/2014.  FINDINGS: The heart size is normal. Median sternotomy for CABG is noted. No focal airspace disease is present. The lung volumes are low. There is no significant edema or effusion. The visualized soft tissues and bony thorax are otherwise unremarkable.  IMPRESSION: 1. Low lung volumes. 2. No acute cardiopulmonary disease.   Electronically Signed   By: San Morelle M.D.   On: 03/18/2015 13:16   Portable Chest X-ray 1 View 03/24/2015   CLINICAL DATA:  Chest pain.  Colon cancer.  Coronary artery disease.  EXAM: PORTABLE CHEST - 1 VIEW  COMPARISON:  03/18/2015  FINDINGS: Prior CABG. Upper normal heart size for projection. The lungs appear clear. Slight prominence of the left pericardial adipose tissue.  IMPRESSION: 1. No active cardiopulmonary disease.   Electronically Signed   By: Van Clines M.D.   On: 03/24/2015 11:00    EKG: SR, OPR 194, QRS 96nonspecific T changes  TELEMETRY: SR with transient complete heart block and prolonged RR interval of 16 seconds  Assement/Plan: 1. Symptomatic bradycardia:   Syncope recently in the outpatient setting and now inpatient associated with observed prolonged pauses as long as 16 seconds and high degree AVBlock.  Likely secondary to her CAD and ischemia with an occluded SVG to the RCA, which was felt unable to be revascularized and no other reversible causes noted. She is s/p Effient load yesterday which increases her risk of bleeding  complications associated with PPM implant.   This was discussed with the patient, by Dr. Rayann Heman along with all risks and benefits and the patient is agreeable to proceed.   2. CAD NSTEMI this admission, s/p cath with unrevascularizable disease planned for medical management, normal LVEF by LV gram  3. HTN BP appears stable, not on any nodal blocking agents   I have seen, examined the patient, and reviewed the above assessment and plan with Tommye Standard PAC.  Changes to above are made where necessary.  Pt with no reversible cause of AV block with associated syncope.  I have spoken with Dr Fletcher Anon.  Will stop effient and switch to plavix.  Pt understands bleeding risks.  I have also spoken with Dr Caryl Comes.  We agree that we should proceed with PPM at this time rather than to allow effient washout due to risks of recurrent AV block. Risks, benefits, alternatives to pacemaker implantation were discussed in detail with the patient today. The patient understands that the risks include but are not limited to bleeding, infection, pneumothorax, perforation, tamponade, vascular damage, renal failure, MI, stroke, death,  and lead dislodgement and wishes to proceed. She reports prior contrast allergy.  Will therefore premedicate and proceed with the procedure at the next available time.   Co Sign: Thompson Grayer, MD 03/25/2015 12:20 PM

## 2015-03-25 NOTE — Progress Notes (Signed)
Patient Name: Rachel Clark Date of Encounter: 03/25/2015  Primary Cardiologist: Dr. [name]   Active Problems:   Coronary artery disease, post CABG 2007    Lightheadedness   Unstable angina   Syncope   Hyperglycemia   Angina pectoris    SUBJECTIVE  Denies any CP or SOB. Says she was sleeping earlier, denies passing out today, however per nuse, she was unarouseable for few seconds and awoke only when QRS restored.  CURRENT MEDS . carbamazepine  500 mg Oral BID  . DULoxetine  60 mg Oral q morning - 10a  . ethosuximide  250 mg Oral Daily   And  . ethosuximide  500 mg Oral BID  . ezetimibe  10 mg Oral QHS  . levothyroxine  50 mcg Oral QAC breakfast  . prasugrel  10 mg Oral Daily  . sodium chloride  3 mL Intravenous Q12H    OBJECTIVE  Filed Vitals:   03/25/15 0315 03/25/15 0400 03/25/15 0740 03/25/15 0800  BP: 120/85   122/50  Pulse:   70   Temp:  98.1 F (36.7 C)  97.6 F (36.4 C)  TempSrc:  Oral  Oral  Resp:   19 19  Weight:      SpO2:  95% 96% 96%    Intake/Output Summary (Last 24 hours) at 03/25/15 1032 Last data filed at 03/25/15 0900  Gross per 24 hour  Intake   1010 ml  Output    550 ml  Net    460 ml   Filed Weights   03/24/15 0910  Weight: 192 lb 7.4 oz (87.3 kg)    PHYSICAL EXAM  General: Pleasant, NAD. Neuro: Alert and oriented X 3. Moves all extremities spontaneously. Psych: Normal affect. HEENT:  Normal  Neck: Supple without bruits or JVD. Lungs:  Resp regular and unlabored, CTA. Heart: RRR no s3, s4, or murmurs. Abdomen: Soft, non-tender, non-distended, BS + x 4.  Extremities: No clubbing, cyanosis or edema. DP/PT/Radials 2+ and equal bilaterally.  Accessory Clinical Findings  CBC  Recent Labs  03/24/15 1110 03/25/15 0254  WBC 7.9 9.2  NEUTROABS 5.6  --   HGB 12.9 12.4  HCT 38.5 37.1  MCV 87.3 87.7  PLT 318 595   Basic Metabolic Panel  Recent Labs  03/24/15 1110 03/25/15 0254  NA 134* 136  K 4.3 3.9  CL 100*  103  CO2 27 26  GLUCOSE 111* 116*  BUN 9 11  CREATININE 0.61 0.64  CALCIUM 8.9 8.4*   Liver Function Tests  Recent Labs  03/25/15 0254  AST 13*  ALT 15  ALKPHOS 68  BILITOT 0.3  PROT 6.4*  ALBUMIN 3.1*   Cardiac Enzymes  Recent Labs  03/24/15 1110 03/24/15 1605 03/24/15 2154  TROPONINI 0.32* 0.21* 0.18*   Hemoglobin A1C  Recent Labs  03/24/15 1110  HGBA1C 5.9*   Fasting Lipid Panel  Recent Labs  03/25/15 0254  CHOL 248*  HDL 65  LDLCALC 164*  TRIG 94  CHOLHDL 3.8   Thyroid Function Tests  Recent Labs  03/24/15 1110  TSH 0.496    TELE Sinus with with multiple sinus pause, longest episode 16 sec long with P wave but no QRS    ECG  NSR without significant ST-T wave changes  Radiology/Studies  Dg Chest 2 View  03/18/2015   CLINICAL DATA:  Chest pain.  Dizziness.  EXAM: CHEST - 2 VIEW  COMPARISON:  Two-view chest x-ray 11/03/2014.  FINDINGS: The heart size is normal. Median  sternotomy for CABG is noted. No focal airspace disease is present. The lung volumes are low. There is no significant edema or effusion. The visualized soft tissues and bony thorax are otherwise unremarkable.  IMPRESSION: 1. Low lung volumes. 2. No acute cardiopulmonary disease.   Electronically Signed   By: San Morelle M.D.   On: 03/18/2015 13:16   Portable Chest X-ray 1 View  03/24/2015   CLINICAL DATA:  Chest pain.  Colon cancer.  Coronary artery disease.  EXAM: PORTABLE CHEST - 1 VIEW  COMPARISON:  03/18/2015  FINDINGS: Prior CABG. Upper normal heart size for projection. The lungs appear clear. Slight prominence of the left pericardial adipose tissue.  IMPRESSION: 1. No active cardiopulmonary disease.   Electronically Signed   By: Van Clines M.D.   On: 03/24/2015 11:00    ASSESSMENT AND PLAN  64 yo female with PMH of CAD (remote NSTEMI s/p PCI to RCA, s/p CABG 2007), ASA intolerance due to GI upset, HLD with statin intolerance, colon CA s/p resection and  chemotherapy, seizure, fibromyalgia, GERD, hypothyroidism, former smoker who was seen in the office on 03/24/2015 for post ED followup. She was seen in the ED previously but declined inpatient stay.  1. NSTEMI  - cath 03/24/2015 significant 2v dx CAD with chronically occluded mid LAD and prox RCA at the previously placed stent. Patent LIMA to LAD. Occluded SVG to RCA by which appearance is subacute.  Risk of opening SVG to RCA outweigh the benefits at the present time. Recommended medical therapy.   2. Recurrent syncope with significant sinus pause - longest one 16 sec  - per pt, passed out at work in last 2 days. Today, she claimed to be sleeping during the episode of prolonged pause, but per nurse, she was not arouseable and she woke up only after QRS restored on telemetry  - likely related to the occluded SVG to RCA, again per interventional team, risk of opening occlusion higher than benefit. Will discuss with MD, likely need pacemaker.  - Zoll at bedside. Make NPO  3. CAD s/p CABG  - Last cath in 2010 - 2 v CAD with L-LAD and S-RCA patent, small caliber Dx 70% (treated medically - no amenable to PCI), CFX free of significant disease, normal LVF.   -  4. HLD - per notes, intolerant of statins and does not wish to try a PCSK-9 inhibitor  5. Hypothyroidism: TSH normal.   6. Prediabetes: Hgb A1C 5.9   Hilbert Corrigan PA-C Pager: [phone]  I have seen and examined the patient along with Almyra Deforest PA-C.  I have reviewed the chart, notes and new data.  I agree with PA/NP's note.  Transient, but lengthy episode of complete heart block, second episode of syncope this week. No potential causal pharmacological agents. May be related to loss of both native RCA and SVG-RCA.  PLAN: Recommend dual chamber permanent pacemaker. Discussed the procedure   Sanda Klein, MD, [facility] [phone] 03/25/2015, 11:09 AM

## 2015-03-25 NOTE — Discharge Instructions (Signed)
° ° °  Supplemental Discharge Instructions for  Pacemaker/Defibrillator Patients  Activity No heavy lifting or vigorous activity with your left/right arm for 6 to 8 weeks.  Do not raise your left/right arm above your head for one week.  Gradually raise your affected arm as drawn below.           __        03/30/15                03/31/15                  04/01/15                    04/02/15  NO DRIVING for  6 weeks     WOUND CARE - Keep the wound area clean and dry.  Do not get this area wet for one week. No showers for one week; you may shower on   04/02/15  . - The tape/steri-strips on your wound will fall off; do not pull them off.  No bandage is needed on the site.  DO  NOT apply any creams, oils, or ointments to the wound area. - If you notice any drainage or discharge from the wound, any swelling or bruising at the site, or you develop a fever > 101? F after you are discharged home, call the office at once.  Special Instructions - You are still able to use cellular telephones; use the ear opposite the side where you have your pacemaker/defibrillator.  Avoid carrying your cellular phone near your device. - When traveling through airports, show security personnel your identification card to avoid being screened in the metal detectors.  Ask the security personnel to use the hand wand. - Avoid arc welding equipment, MRI testing (magnetic resonance imaging), TENS units (transcutaneous nerve stimulators).  Call the office for questions about other devices. - Avoid electrical appliances that are in poor condition or are not properly grounded. - Microwave ovens are safe to be near or to operate.

## 2015-03-25 NOTE — Progress Notes (Signed)
  Echocardiogram 2D Echocardiogram has been performed.  Rachel Clark 03/25/2015, 1:07 PM

## 2015-03-25 NOTE — H&P (View-Only) (Signed)
ELECTROPHYSIOLOGY CONSULT NOTE    Patient ID: Rachel Clark MRN: [id number], DOB/AGE: [birth year] 64 y.o.  Admit date: 03/24/2015 Date of Consult: @TODAY @  Primary Physician: Vidal Schwalbe, MD Primary Cardiologist: Dr. Ron Parker Electrophysiologist: Dr. Rayann Heman  Reason for Consultation:  [facility]  HPI: Rachel Clark is a 64 y.o. female came in to the hospital for recurrent angina with known CAD, she had a recent ER visit with the same associated with syncope at work which she declined admission at that time (03/18/15).  This admission she underwent cardiac cath with CAD not ammenable for revascularization.  This morning she was observed to have prolonged episodes as long as 15 seconds of high degree AVBlock, with syncope, staff was not able to arouse her until her SR was restored.  She is currently resting in bed, feeling well without CP.  Past Medical History  Diagnosis Date  . Coronary artery disease     a. remote NSTEMI with PCI to the RCA; b. S/P CABG in 2007; c. 2010 - 2 v CAD with L-LAD and S-RCA patent, small caliber Dx 70% (treated medically - no amenable to PCI), CFX free of significant disease, normal LVF. d. 10/2014 Lexi MV: EF 61%, no ischemia/infarct.  . Dyslipidemia   . Monoclonal gammopathy of unknown significance   . Colon cancer 12/04/2011    s/p Laparoscopic-assisted transverse colectomy on 12/19/2011 by Dr. Donne Hazel.  pT3 N0 M0.   Marland Kitchen Hyperlipidemia   . PONV (postoperative nausea and vomiting)   . Fibromyalgia   . GERD (gastroesophageal reflux disease)   . Gastric ulcer   . Hypothyroid   . Depression   . Wears glasses   . Family history of adverse reaction to anesthesia     "son gets PONV"  . Iron deficiency anemia 11/17/2011  . VELFYBOF(751.0)     "maybe monthly" (10/26/2014)  . 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 Arthritis     hands (10/26/2014)     Surgical History:  Past Surgical History    Procedure Laterality Date  . Posterior laminectomy / decompression cervical spine      20+yrs ago  . Colonoscopy    . Colon resection  12/19/2011    Procedure: COLON RESECTION LAPAROSCOPIC;  Surgeon: Rolm Bookbinder, MD;  Location: [facility];  Service: General;  Laterality: N/A;  laparoscopic hand assisted partial colon resection  . Portacath placement  06/04/2012    Procedure: INSERTION PORT-A-CATH;  Surgeon: Rolm Bookbinder, MD;  Location: [facility];  Service: General;  Laterality: N/A;  Insertion of port-a-cath   . Port-a-cath removal N/A 06/12/2013    Procedure: REMOVAL PORT-A-CATH;  Surgeon: Rolm Bookbinder, MD;  Location: [facility];  Service: General;  Laterality: N/A;  . Colon surgery    . Anterior cervical decomp/discectomy fusion  1980's  . Back surgery    . Vaginal hysterectomy    . Dilation and curettage of uterus  1973  . Coronary artery bypass graft  2007    x 2  . Cardiac catheterization  05/20/2009    obstructive native vessel disease in LAD, RCA, and first diagonal, patent vein graft to distal RCA and LIMA to LAD,normal. ef 60%  . Coronary angioplasty with stent placement  ~ 2007    1 stent  . Cardiac catheterization N/A 03/24/2015    Procedure: Left Heart Cath and Cors/Grafts Angiography;  Surgeon: Wellington Hampshire, MD;  Location: [facility];  Service: Cardiovascular;  Laterality: N/A;     Prescriptions prior to admission  Medication Sig Dispense Refill Last Dose  . Azelastine HCl 0.15 % SOLN Place 1 spray into the nose as needed (allergies).   12 Past Week at Unknown time  . carbamazepine (TEGRETOL XR) 100 MG 12 hr tablet Take 500 mg by mouth 2 (two) times daily.   03/24/2015 at Unknown time  . DULoxetine (CYMBALTA) 60 MG capsule Take 60 mg by mouth every morning.   03/24/2015 at Unknown time  . ethosuximide (ZARONTIN) 250 MG capsule Take 250-500 mg by mouth 3 (three) times daily. Take 1 capsule (250mg ) in the morning, 2 capsules (500mg ) in the  afternoon, and 2 capsules (500mg ) at night   03/24/2015 at Unknown time  . ezetimibe (ZETIA) 10 MG tablet Take 1 tablet (10 mg total) by mouth daily. 30 tablet 3 03/23/2015 at Unknown time  . levothyroxine (SYNTHROID, LEVOTHROID) 50 MCG tablet Take 50 mcg by mouth daily.    03/24/2015 at Unknown time  . nitroGLYCERIN (NITROSTAT) 0.4 MG SL tablet Place 1 tablet (0.4 mg total) under the tongue every 5 (five) minutes as needed. For chest pain. 25 tablet 1 Past Week at Unknown time  . tiZANidine (ZANAFLEX) 2 MG tablet Take 2-4 tablets by mouth at bedtime as needed for muscle spasms.   2 03/23/2015 at Unknown time  . MINIVELLE 0.05 MG/24HR patch Apply 1 patch topically 2 (two) times a week. Monday and Thursday  12 03/22/2015    Inpatient Medications:  . carbamazepine  500 mg Oral BID  . DULoxetine  60 mg Oral q morning - 10a  . ethosuximide  250 mg Oral Daily   And  . ethosuximide  500 mg Oral BID  . ezetimibe  10 mg Oral QHS  . levothyroxine  50 mcg Oral QAC breakfast  . prasugrel  10 mg Oral Daily  . sodium chloride  3 mL Intravenous Q12H    Allergies:  Allergies  Allergen Reactions  . Aspirin Other (See Comments)    GI upset  . Erythromycin     Gi upset  . Imdur [Isosorbide Mononitrate]     Pt does not recall reaction  . Lisinopril     cough  . Niacin And Related     Pt does not recall reactions   . Penicillins     Gi upset  . Statins     Muscle pain  . Sulfa Drugs Cross Reactors Swelling  . Tetracyclines & Related     Social History   Social History  . Marital Status: Married    Spouse Name: N/A  . Number of Children: 2  . Years of Education: 12   Occupational History  . office work     unemployed   Social History Main Topics  . Smoking status: Former Smoker -- 0.30 packs/day for 20 years    Types: Cigarettes    Quit date: 06/06/1981  . Smokeless tobacco: Never Used  . Alcohol Use: 0.6 oz/week    1 Glasses of wine per week  . Drug Use: No  . Sexual Activity: Yes     Birth Control/ Protection: Surgical   Other Topics Concern  . Not on file   Social History Narrative   Patient is married with 2 children.   Patient is right handed.   Patient has a high school education with some college education.   Patient drinks 2 cups daily.     Family History  Problem Relation  Age of Onset  . Heart attack Father   . Heart disease Father   . Heart attack Brother 33  . Cancer Mother     lung  . Stroke Neg Hx      Review of Systems: General: No chills, fever, night sweats or weight changes  Cardiovascular:  No chest pain this morning, nodyspnea on exertion, edema, orthopnea, palpitations, paroxysmal nocturnal dyspnea Dermatological: No rash, lesions or masses Respiratory: No cough, dyspnea Urologic: No hematuria, dysuria Abdominal: No nausea, vomiting, diarrhea, bright red blood per rectum, melena, or hematemesis Neurologic: she was unresponsive associated with the heart block All other systems reviewed and are otherwise negative except as noted above.  Physical Exam: Filed Vitals:   03/25/15 0315 03/25/15 0400 03/25/15 0740 03/25/15 0800  BP: 120/85   122/50  Pulse:   70   Temp:  98.1 F (36.7 C)  97.6 F (36.4 C)  TempSrc:  Oral  Oral  Resp:   19 19  Weight:      SpO2:  95% 96% 96%    GEN- The patient is well appearing, alert and oriented x 3 today.   HEENT: normocephalic, atraumatic; sclera clear, conjunctiva pink; hearing intact; oropharynx clear; neck supple  Lungs- Clear to ausculation bilaterally, normal work of breathing.  No wheezes, rales, rhonchi Heart- Regular rate and rhythm, no murmurs, rubs or gallops, PMI not laterally displaced GI- soft, non-tender, non-distended, bowel sounds present Extremities- no clubbing, cyanosis, or edema; DP/PT/radial pulses 2+ bilaterally MS- no significant deformity or atrophy Skin- warm and dry, no rash or lesion Psych- euthymic mood, full affect Neuro- strength and sensation are  intact  Labs:   Lab Results  Component Value Date   WBC 9.2 03/25/2015   HGB 12.4 03/25/2015   HCT 37.1 03/25/2015   MCV 87.7 03/25/2015   PLT 302 03/25/2015    Recent Labs Lab 03/25/15 0254  NA 136  K 3.9  CL 103  CO2 26  BUN 11  CREATININE 0.64  CALCIUM 8.4*  PROT 6.4*  BILITOT 0.3  ALKPHOS 68  ALT 15  AST 13*  GLUCOSE 116*      Radiology/Studies: Dg Chest 2 View 03/18/2015   CLINICAL DATA:  Chest pain.  Dizziness.  EXAM: CHEST - 2 VIEW  COMPARISON:  Two-view chest x-ray 11/03/2014.  FINDINGS: The heart size is normal. Median sternotomy for CABG is noted. No focal airspace disease is present. The lung volumes are low. There is no significant edema or effusion. The visualized soft tissues and bony thorax are otherwise unremarkable.  IMPRESSION: 1. Low lung volumes. 2. No acute cardiopulmonary disease.   Electronically Signed   By: San Morelle M.D.   On: 03/18/2015 13:16   Portable Chest X-ray 1 View 03/24/2015   CLINICAL DATA:  Chest pain.  Colon cancer.  Coronary artery disease.  EXAM: PORTABLE CHEST - 1 VIEW  COMPARISON:  03/18/2015  FINDINGS: Prior CABG. Upper normal heart size for projection. The lungs appear clear. Slight prominence of the left pericardial adipose tissue.  IMPRESSION: 1. No active cardiopulmonary disease.   Electronically Signed   By: Van Clines M.D.   On: 03/24/2015 11:00    EKG: SR, OPR 194, QRS 96nonspecific T changes  TELEMETRY: SR with transient complete heart block and prolonged RR interval of 16 seconds  Assement/Plan: 1. Symptomatic bradycardia:   Syncope recently in the outpatient setting and now inpatient associated with observed prolonged pauses as long as 16 seconds and high degree AVBlock.  Likely secondary to her CAD and ischemia with an occluded SVG to the RCA, which was felt unable to be revascularized and no other reversible causes noted. She is s/p Effient load yesterday which increases her risk of bleeding  complications associated with PPM implant.   This was discussed with the patient, by Dr. Rayann Heman along with all risks and benefits and the patient is agreeable to proceed.   2. CAD NSTEMI this admission, s/p cath with unrevascularizable disease planned for medical management, normal LVEF by LV gram  3. HTN BP appears stable, not on any nodal blocking agents   I have seen, examined the patient, and reviewed the above assessment and plan with Tommye Standard PAC.  Changes to above are made where necessary.  Pt with no reversible cause of AV block with associated syncope.  I have spoken with Dr Fletcher Anon.  Will stop effient and switch to plavix.  Pt understands bleeding risks.  I have also spoken with Dr Caryl Comes.  We agree that we should proceed with PPM at this time rather than to allow effient washout due to risks of recurrent AV block. Risks, benefits, alternatives to pacemaker implantation were discussed in detail with the patient today. The patient understands that the risks include but are not limited to bleeding, infection, pneumothorax, perforation, tamponade, vascular damage, renal failure, MI, stroke, death,  and lead dislodgement and wishes to proceed. She reports prior contrast allergy.  Will therefore premedicate and proceed with the procedure at the next available time.   Co Sign: Thompson Grayer, MD 03/25/2015 12:20 PM

## 2015-03-25 NOTE — Interval H&P Note (Signed)
History and Physical Interval Note:  03/25/2015 1:11 PM  Rachel Clark  has presented today for surgery, with the diagnosis of complete heart block  The various methods of treatment have been discussed with the patient and family. After consideration of risks, benefits and other options for treatment, the patient has consented to  Procedure(s): Pacemaker Implant (N/A) as a surgical intervention .  The patient's history has been reviewed, patient examined, no change in status, stable for surgery.  I have reviewed the patient's chart and labs.  Questions were answered to the patient's satisfaction.     Thompson Grayer

## 2015-03-25 NOTE — Progress Notes (Signed)
Not coming back to stepdown, husband made aware, belongings given to same.

## 2015-03-25 NOTE — Progress Notes (Addendum)
Complained of pressure like chest pain scale of 6 , same pain before she had the cardiac cath. BP-155/62 , EKG done without changes, nitro. Sl x2 given with relief. Also had an episode of heart rate went down to 20's x2 for few sec. And came back up to 60's. Asymptomatic, PA made aware. Continue to monitor.

## 2015-03-26 ENCOUNTER — Inpatient Hospital Stay (HOSPITAL_COMMUNITY): Payer: BLUE CROSS/BLUE SHIELD

## 2015-03-26 ENCOUNTER — Encounter (HOSPITAL_COMMUNITY): Payer: Self-pay | Admitting: Internal Medicine

## 2015-03-26 ENCOUNTER — Other Ambulatory Visit: Payer: Self-pay

## 2015-03-26 LAB — BASIC METABOLIC PANEL
ANION GAP: 7 (ref 5–15)
BUN: 9 mg/dL (ref 6–20)
CHLORIDE: 101 mmol/L (ref 101–111)
CO2: 26 mmol/L (ref 22–32)
Calcium: 8.5 mg/dL — ABNORMAL LOW (ref 8.9–10.3)
Creatinine, Ser: 0.76 mg/dL (ref 0.44–1.00)
GFR calc Af Amer: >60 mL/min (ref >60)
GLUCOSE: 129 mg/dL — AB (ref 65–99)
POTASSIUM: 3.9 mmol/L (ref 3.5–5.1)
Sodium: 134 mmol/L — ABNORMAL LOW (ref 135–145)

## 2015-03-26 MED ORDER — CLOPIDOGREL BISULFATE 75 MG PO TABS: 75.0000 mg | ORAL_TABLET | Freq: Every day | ORAL | Status: DC

## 2015-03-26 MED FILL — Sodium Chloride Irrigation Soln 0.9%: Qty: 500 | Status: AC

## 2015-03-26 MED FILL — Gentamicin Sulfate Inj 40 MG/ML: INTRAMUSCULAR | Qty: 2 | Status: AC

## 2015-03-26 NOTE — Progress Notes (Signed)
Pt ambulate 100 ft. Tolerated well , denies any discomforts , v/s stable.

## 2015-03-26 NOTE — Progress Notes (Signed)
SUBJECTIVE: The patient is doing well today.  At this time, she denies chest pain, shortness of breath, or any new concerns.  CURRENT MEDICATIONS: . carbamazepine  500 mg Oral BID  . DULoxetine  60 mg Oral q morning - 10a  . ethosuximide  250 mg Oral Daily   And  . ethosuximide  500 mg Oral BID  . ezetimibe  10 mg Oral QHS  . levothyroxine  50 mcg Oral QAC breakfast  . sodium chloride  3 mL Intravenous Q12H      OBJECTIVE: Physical Exam: Filed Vitals:   03/25/15 1900 03/25/15 2000 03/25/15 2016 03/26/15 0322  BP: 158/67 164/69 147/70 148/59  Pulse: 71 73 75 66  Temp:   97.8 F (36.6 C) 97.5 F (36.4 C)  TempSrc:   Oral Oral  Resp: 16 15 20 17   Weight:    195 lb 15.8 oz (88.9 kg)  SpO2: 96% 95% 97% 100%    Intake/Output Summary (Last 24 hours) at 03/26/15 0736 Last data filed at 03/25/15 2300  Gross per 24 hour  Intake    880 ml  Output   1000 ml  Net   -120 ml    Telemetry reveals sinus rhythm  GEN- The patient is well appearing, alert and oriented x 3 today.   Head- normocephalic, atraumatic Eyes-  Sclera clear, conjunctiva pink Ears- hearing intact Oropharynx- clear Neck- supple  Lungs- Clear to ausculation bilaterally, normal work of breathing Heart- Regular rate and rhythm  GI- soft, NT, ND, + BS Extremities- no clubbing, cyanosis, or edema Skin- no rash or lesion Psych- euthymic mood, full affect Neuro- strength and sensation are intact  LABS: Basic Metabolic Panel:  Recent Labs  03/25/15 0254 03/26/15 0312  NA 136 134*  K 3.9 3.9  CL 103 101  CO2 26 26  GLUCOSE 116* 129*  BUN 11 9  CREATININE 0.64 0.76  CALCIUM 8.4* 8.5*   Liver Function Tests:  Recent Labs  03/25/15 0254  AST 13*  ALT 15  ALKPHOS 68  BILITOT 0.3  PROT 6.4*  ALBUMIN 3.1*   CBC:  Recent Labs  03/24/15 1110 03/25/15 0254  WBC 7.9 9.2  NEUTROABS 5.6  --   HGB 12.9 12.4  HCT 38.5 37.1  MCV 87.3 87.7  PLT 318 302   Cardiac Enzymes:  Recent Labs  03/24/15 1110 03/24/15 1605 03/24/15 2154  TROPONINI 0.32* 0.21* 0.18*   Hemoglobin A1C:  Recent Labs  03/24/15 1110  HGBA1C 5.9*   Fasting Lipid Panel:  Recent Labs  03/25/15 0254  CHOL 248*  HDL 65  LDLCALC 164*  TRIG 94  CHOLHDL 3.8   Thyroid Function Tests:  Recent Labs  03/24/15 1110  TSH 0.496    RADIOLOGY: Dg Chest 2 View 03/18/2015   CLINICAL DATA:  Chest pain.  Dizziness.  EXAM: CHEST - 2 VIEW  COMPARISON:  Two-view chest x-ray 11/03/2014.  FINDINGS: The heart size is normal. Median sternotomy for CABG is noted. No focal airspace disease is present. The lung volumes are low. There is no significant edema or effusion. The visualized soft tissues and bony thorax are otherwise unremarkable.  IMPRESSION: 1. Low lung volumes. 2. No acute cardiopulmonary disease.   Electronically Signed   By: San Morelle M.D.   On: 03/18/2015 13:16    ASSESSMENT AND PLAN:  Principal Problem:   Complete heart block Active Problems:   Coronary artery disease, post CABG 2007    Lightheadedness   Unstable angina  Syncope   Hyperglycemia   Angina pectoris   1.  Intermittent complete heart block S/p PPM implant 03/25/15 CXR without ptx Device interrogation normal Routine wound care and follow up  2.  CAD/NSTEMI Management per general cardiology Dr Rayann Heman discussed with Dr Fletcher Anon - would start Plavix in 72 hours  3.  HTN Stable No change required today  Device appts/instructions entered in AVS.  Ok to DC home from EP standpoint  Electrophysiology team to see as needed while here. Please call with questions.  Chanetta Marshall, NP 03/26/2015 8:23 AM   I have seen and examined the patient along with Chanetta Marshall, NP.  I have reviewed the chart, notes and new data.  I agree with NP's note.  PLAN: Surgical site healthy. Discussed wound care and activity restrictions.  Sanda Klein, MD, [place] [phone] 03/26/2015, 8:39 AM

## 2015-03-26 NOTE — Progress Notes (Signed)
Pt  ambulating in hallway with husband , c/o mid chest "heaviness" upon returning in her room , discomfort eased off after few min per pt.   .b/p 137/62 mmhg. Resting in bed, , no arrythmia noted. Benjaman Pott NP notified. Will monitor pt.

## 2015-03-26 NOTE — Discharge Summary (Signed)
ELECTROPHYSIOLOGY PROCEDURE DISCHARGE SUMMARY    Patient ID: Rachel Clark,  MRN: [id number], DOB/AGE: September 24, 1950 64 y.o.  Admit date: 03/24/2015 Discharge date: 03/26/2015  Primary Care Physician: Vidal Schwalbe, MD Primary Cardiologist: Ron Parker Electrophysiologist: Allred  Primary Discharge Diagnosis:  Intermittent complete heart block status post pacemaker implantation this admission NSTEMI  Secondary Discharge Diagnosis:  1.  CAD s/p CABG 2.  Hyperlipidemia 3.  Hypothyroidism 4.  Colon cancer 5.  Depression 6.  Fibromyalgia 7.  Arthritis 8.  GERD  Allergies  Allergen Reactions  . Aspirin Other (See Comments)    GI upset  . Erythromycin     Gi upset  . Imdur [Isosorbide Mononitrate]     Pt does not recall reaction  . Lisinopril     cough  . Niacin And Related     Pt does not recall reactions   . Penicillins     Gi upset  . Statins     Muscle pain  . Sulfa Drugs Cross Reactors Swelling  . Tetracyclines & Related      Procedures This Admission:  1.  Cardiac catheterization on 03/24/15 by Dr Fletcher Anon. This study demonstrated significant 2V CAD with chronically occluded mid LAD and proximal RCA at previously placed stent. Patent LIMA to LAD.  Occluded SVG to RCA which is subacute with reasonable left to right collaterals. There were no early apparent complications. 2.  Implantation of a MDT dual chamber PPM on 03/25/15 by Dr Rayann Heman.  The patient received a MDT model number Advisa PPM with model number 5076 right atrial lead and 5076 right ventricular lead. There were no immediate post procedure complications. 2.  CXR on 03/26/15 demonstrated no pneumothorax status post device implantation.   Brief HPI/Hospital Course:  Rachel Clark is a 64 y.o. female with a past medical history as outlined above. She presented to the hospital with recurrent angina and syncope and refused admission from the ER. She then presented to the office after having repeat angina and  was directly admitted to the hospital.  Troponins were elevated. She underwent catheterization with details as outlined above. She then had ventricular standstill on telemetry and EP saw the patient and recommended PPM implantation.  Risks, benefits, and alternatives to PPM implantation were reviewed with the patient who wished to proceed. The patient underwent implantation of a MDT dual chamber pacemaker with details as outlined above.  She  was monitored on telemetry overnight which demonstrated sinus rhythm.  Left chest was without hematoma or ecchymosis.  The device was interrogated and found to be functioning normally.  CXR was obtained and demonstrated no pneumothorax status post device implantation.  Wound care, arm mobility, and restrictions were reviewed with the patient.  The patient was examined and considered stable for discharge to home.   She will have TOC visit in 10-14 days at same time as wound check appointment. She will be started on Plavix in 72 hours to allow PPM pocket time to heal.   No driving x6 weeks   Physical Exam: Filed Vitals:   03/25/15 2016 03/26/15 0322 03/26/15 0738 03/26/15 0800  BP: 147/70 148/59 130/84   Pulse: 75 66 73   Temp: 97.8 F (36.6 C) 97.5 F (36.4 C) 97.7 F (36.5 C)   TempSrc: Oral Oral Oral   Resp: 20 17 18 16   Weight:  195 lb 15.8 oz (88.9 kg)    SpO2: 97% 100% 100%     Labs:   Lab Results  Component  Value Date   WBC 9.2 03/25/2015   HGB 12.4 03/25/2015   HCT 37.1 03/25/2015   MCV 87.7 03/25/2015   PLT 302 03/25/2015     Recent Labs Lab 03/25/15 0254 03/26/15 0312  NA 136 134*  K 3.9 3.9  CL 103 101  CO2 26 26  BUN 11 9  CREATININE 0.64 0.76  CALCIUM 8.4* 8.5*  PROT 6.4*  --   BILITOT 0.3  --   ALKPHOS 68  --   ALT 15  --   AST 13*  --   GLUCOSE 116* 129*    Discharge Medications:    Medication List    TAKE these medications        Azelastine HCl 0.15 % Soln  Place 1 spray into the nose as needed  (allergies).     carbamazepine 100 MG 12 hr tablet  Commonly known as:  TEGRETOL XR  Take 500 mg by mouth 2 (two) times daily.     clopidogrel 75 MG tablet  Commonly known as:  PLAVIX  Take 1 tablet (75 mg total) by mouth daily. Start on 03/29/15     DULoxetine 60 MG capsule  Commonly known as:  CYMBALTA  Take 60 mg by mouth every morning.     ethosuximide 250 MG capsule  Commonly known as:  ZARONTIN  Take 250-500 mg by mouth 3 (three) times daily. Take 1 capsule (250mg ) in the morning, 2 capsules (500mg ) in the afternoon, and 2 capsules (500mg ) at night     ezetimibe 10 MG tablet  Commonly known as:  ZETIA  Take 1 tablet (10 mg total) by mouth daily.     levothyroxine 50 MCG tablet  Commonly known as:  SYNTHROID, LEVOTHROID  Take 50 mcg by mouth daily.     MINIVELLE 0.05 MG/24HR patch  Generic drug:  estradiol  Apply 1 patch topically 2 (two) times a week. Monday and Thursday     nitroGLYCERIN 0.4 MG SL tablet  Commonly known as:  NITROSTAT  Place 1 tablet (0.4 mg total) under the tongue every 5 (five) minutes as needed. For chest pain.     tiZANidine 2 MG tablet  Commonly known as:  ZANAFLEX  Take 2-4 tablets by mouth at bedtime as needed for muscle spasms.        Disposition:  Discharge Instructions    Diet - low sodium heart healthy    Complete by:  As directed      Discharge instructions    Complete by:  As directed   No driving x6 weeks     Increase activity slowly    Complete by:  As directed           Follow-up Information    Follow up with [facility] On 04/05/2015.   Why:  at [place] for wound check   Contact information:   [address] [place] [phone]       Follow up with Thompson Grayer, MD On 07/01/2015.   Specialty:  Cardiology   Why:  at 12noon   Contact information:   [place] [address] [phone]       Duration of Discharge Encounter: Greater than 30 minutes  including physician time.  Signed, Chanetta Marshall, NP 03/26/2015 8:57 AM   Device interrogation is reviewed and normal CXR reveals no ptx, stable leads  Given ASA allergy, would favor plavix (lower bleeding risk) to begin without loading if no  significant bleeding in 72 hours.  Routine wound care and follow-up  Thompson Grayer MD, [facility] 03/26/2015 9:18 AM

## 2015-03-29 ENCOUNTER — Other Ambulatory Visit: Payer: Self-pay | Admitting: Neurology

## 2015-04-05 ENCOUNTER — Encounter: Payer: Self-pay | Admitting: Nurse Practitioner

## 2015-04-05 ENCOUNTER — Ambulatory Visit (INDEPENDENT_AMBULATORY_CARE_PROVIDER_SITE_OTHER): Payer: BLUE CROSS/BLUE SHIELD | Admitting: *Deleted

## 2015-04-05 ENCOUNTER — Encounter: Payer: Self-pay | Admitting: Internal Medicine

## 2015-04-05 ENCOUNTER — Ambulatory Visit (INDEPENDENT_AMBULATORY_CARE_PROVIDER_SITE_OTHER): Payer: BLUE CROSS/BLUE SHIELD | Admitting: Nurse Practitioner

## 2015-04-05 VITALS — BP 128/82 | HR 88 | Ht 69.0 in | Wt 190.6 lb

## 2015-04-05 DIAGNOSIS — E785 Hyperlipidemia, unspecified: Secondary | ICD-10-CM

## 2015-04-05 DIAGNOSIS — Z950 Presence of cardiac pacemaker: Secondary | ICD-10-CM | POA: Diagnosis not present

## 2015-04-05 DIAGNOSIS — I442 Atrioventricular block, complete: Secondary | ICD-10-CM | POA: Diagnosis not present

## 2015-04-05 DIAGNOSIS — I259 Chronic ischemic heart disease, unspecified: Secondary | ICD-10-CM | POA: Diagnosis not present

## 2015-04-05 DIAGNOSIS — R0789 Other chest pain: Secondary | ICD-10-CM

## 2015-04-05 DIAGNOSIS — Z9889 Other specified postprocedural states: Secondary | ICD-10-CM | POA: Diagnosis not present

## 2015-04-05 LAB — BASIC METABOLIC PANEL
BUN: 15 mg/dL (ref 7–25)
CO2: 26 mmol/L (ref 20–31)
Calcium: 9.2 mg/dL (ref 8.6–10.4)
Chloride: 99 mmol/L (ref 98–110)
Creat: 0.65 mg/dL (ref 0.50–0.99)
Glucose, Bld: 107 mg/dL — ABNORMAL HIGH (ref 65–99)
Potassium: 4.6 mmol/L (ref 3.5–5.3)
Sodium: 134 mmol/L — ABNORMAL LOW (ref 135–146)

## 2015-04-05 LAB — CUP PACEART INCLINIC DEVICE CHECK
Brady Statistic AS VS Percent: 99.54 %
Lead Channel Impedance Value: 380 Ohm
Lead Channel Impedance Value: 399 Ohm
Lead Channel Pacing Threshold Amplitude: 0.625 V
Lead Channel Pacing Threshold Pulse Width: 0.4 ms
Lead Channel Pacing Threshold Pulse Width: 0.4 ms
Lead Channel Sensing Intrinsic Amplitude: 1.5 mV
Lead Channel Sensing Intrinsic Amplitude: 9.25 mV
Lead Channel Setting Pacing Amplitude: 3.5 V
MDC IDC MSMT BATTERY VOLTAGE: 3.11 V
MDC IDC MSMT LEADCHNL RA PACING THRESHOLD AMPLITUDE: 1.125 V
MDC IDC MSMT LEADCHNL RA SENSING INTR AMPL: 2.375 mV
MDC IDC MSMT LEADCHNL RV IMPEDANCE VALUE: 456 Ohm
MDC IDC MSMT LEADCHNL RV IMPEDANCE VALUE: 513 Ohm
MDC IDC MSMT LEADCHNL RV SENSING INTR AMPL: 16 mV
MDC IDC SESS DTM: 20161003171356
MDC IDC SET LEADCHNL RA PACING AMPLITUDE: 3.5 V
MDC IDC SET LEADCHNL RV PACING PULSEWIDTH: 0.4 ms
MDC IDC SET LEADCHNL RV SENSING SENSITIVITY: 2.8 mV
MDC IDC SET ZONE DETECTION INTERVAL: 400 ms
MDC IDC STAT BRADY AP VP PERCENT: 0.03 %
MDC IDC STAT BRADY AP VS PERCENT: 0.35 %
MDC IDC STAT BRADY AS VP PERCENT: 0.07 %
MDC IDC STAT BRADY RA PERCENT PACED: 0.38 %
MDC IDC STAT BRADY RV PERCENT PACED: 0.1 %
Zone Setting Detection Interval: 400 ms

## 2015-04-05 LAB — CBC WITH DIFFERENTIAL/PLATELET
Basophils Absolute: 0 10*3/uL (ref 0.0–0.1)
Basophils Relative: 0 % (ref 0–1)
Eosinophils Absolute: 0.2 10*3/uL (ref 0.0–0.7)
Eosinophils Relative: 3 % (ref 0–5)
HCT: 40.1 % (ref 36.0–46.0)
Hemoglobin: 14 g/dL (ref 12.0–15.0)
Lymphocytes Relative: 28 % (ref 12–46)
Lymphs Abs: 2.3 10*3/uL (ref 0.7–4.0)
MCH: 29.6 pg (ref 26.0–34.0)
MCHC: 34.9 g/dL (ref 30.0–36.0)
MCV: 84.8 fL (ref 78.0–100.0)
MPV: 9.3 fL (ref 8.6–12.4)
Monocytes Absolute: 0.7 10*3/uL (ref 0.1–1.0)
Monocytes Relative: 9 % (ref 3–12)
Neutro Abs: 4.9 10*3/uL (ref 1.7–7.7)
Neutrophils Relative %: 60 % (ref 43–77)
Platelets: 332 10*3/uL (ref 150–400)
RBC: 4.73 MIL/uL (ref 3.87–5.11)
RDW: 13.2 % (ref 11.5–15.5)
WBC: 8.1 10*3/uL (ref 4.0–10.5)

## 2015-04-05 MED ORDER — NITROGLYCERIN 0.4 MG SL SUBL: 0.4000 mg | SUBLINGUAL_TABLET | SUBLINGUAL | Status: DC | PRN

## 2015-04-05 NOTE — Progress Notes (Signed)
CARDIOLOGY OFFICE NOTE  Date:  04/05/2015    Rachel Clark Date of Birth: 1950-10-03 Medical Record #[id number]  PCP:  Vidal Schwalbe, MD  Cardiologist:  [name]  Chief Complaint  Patient presents with  . Post hospital s/p cath and PPM implant    Post hospital visit - seen for Dr. [name].     History of Present Illness: Rachel Clark is a 64 y.o. female who presents today for a TOC visit - however no phone call documented. Seen for Dr. [name].   She has a history of CAD (remote NSTEMI s/p PCI to RCA, s/p CABG 2007), ASA intolerance due to GI upset, HL with statin intolerance, colon CA s/p resection and chemotherapy, seizure, fibromyalgia, GERD, hypothyroidism, and is a former smoker (quit 31 y/a).   Cath in 2010 - 2 v CAD with L-LAD and S-RCA patent, small caliber Dx 70% (treated medically - no amenable to PCI), CFX free of significant disease, normal LVF. She had a nuclear study in 10/2014 which showed no evidence of ischemia with normal LV function.She was seen in lipid clinic and given Livalo. She took it one day and didn't feel right so she stopped it. She was doing well on cardiac standpoint when seen in the office by Dr. [name] 01/2015. She was more recently seen in the ER 9/15/6 with an episode of chest pressure/heaviness while sitting at work - it was somewhat similar to prior cardiac pain but less severe. She took 1 SL NTG and drove herself to the ER. She refused admission.  Seen back in the office by Sharrell Ku, PA for a follow up and had chest tightness/pressure similar to her prior angina in the office. She was admitted for further evaluation. Troponins were elevated. She underwent catheterization with details as outlined below. She then had ventricular standstill on telemetry and EP saw the patient and recommended PPM implantation. The patient underwent implantation of a MDT dual chamber pacemaker without issue. The remainder of her course was uneventful.  She was  not to drive for 6 weeks.  Comes back today. Here with her husband. Doing ok for the most part. She endorses chest tightness at today's visit - would rate it a "1" on scale of 0 to 10. No other associated symptoms. She feels like she is just anxious and reports having some degree of "white coat syndrome". Does not wish to take NTG. No problems with her wound. Not short of breath. On Plavix now with no initial problems.  She is not driving.   Past Medical History  Diagnosis Date  . Coronary artery disease     a. remote NSTEMI with PCI to the RCA; b. S/P CABG in 2007; c. 2010 - 2 v CAD with L-LAD and S-RCA patent, small caliber Dx 70% (treated medically - no amenable to PCI), CFX free of significant disease, normal LVF. d. 10/2014 Lexi MV: EF 61%, no ischemia/infarct.  . Dyslipidemia   . Monoclonal gammopathy of unknown significance   . Colon cancer ([facility]) 12/04/2011    s/p Laparoscopic-assisted transverse colectomy on 12/19/2011 by Dr. Donne Hazel.  pT3 N0 M0.   Marland Kitchen Hyperlipidemia   . PONV (postoperative nausea and vomiting)   . Fibromyalgia   . GERD (gastroesophageal reflux disease)   . Gastric ulcer   . Hypothyroid   . Depression   . Wears glasses   . Family history of adverse reaction to anesthesia     "son gets PONV"  . Iron deficiency  anemia 11/17/2011  . MLYYTKPT(465.6)     "maybe monthly" (10/26/2014)  . 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 Arthritis     hands (10/26/2014)    Past Surgical History  Procedure Laterality Date  . Posterior laminectomy / decompression cervical spine      20+yrs ago  . Colonoscopy    . Colon resection  12/19/2011    Procedure: COLON RESECTION LAPAROSCOPIC;  Surgeon: Rolm Bookbinder, MD;  Location: [facility];  Service: General;  Laterality: N/A;  laparoscopic hand assisted partial colon resection  . Portacath placement  06/04/2012    Procedure: INSERTION PORT-A-CATH;  Surgeon: Rolm Bookbinder, MD;  Location: [facility];  Service: General;  Laterality: N/A;  Insertion of port-a-cath   . Port-a-cath removal N/A 06/12/2013    Procedure: REMOVAL PORT-A-CATH;  Surgeon: Rolm Bookbinder, MD;  Location: [facility];  Service: General;  Laterality: N/A;  . Colon surgery    . Anterior cervical decomp/discectomy fusion  1980's  . Back surgery    . Vaginal hysterectomy    . Dilation and curettage of uterus  1973  . Coronary artery bypass graft  2007    x 2  . Cardiac catheterization  05/20/2009    obstructive native vessel disease in LAD, RCA, and first diagonal, patent vein graft to distal RCA and LIMA to LAD,normal. ef 60%  . Coronary angioplasty with stent placement  ~ 2007    1 stent  . Cardiac catheterization N/A 03/24/2015    Procedure: Left Heart Cath and Cors/Grafts Angiography;  Surgeon: Wellington Hampshire, MD;  Location: [facility];  Service: Cardiovascular;  Laterality: N/A;  . Ep implantable device N/A 03/25/2015    Procedure: Pacemaker Implant;  Surgeon: Thompson Grayer, MD;  Location: [facility];  Service: Cardiovascular;  Laterality: N/A;     Medications: Current Outpatient Prescriptions  Medication Sig Dispense Refill  . Azelastine HCl 0.15 % SOLN Place 1 spray into the nose as needed (allergies).   12  . clopidogrel (PLAVIX) 75 MG tablet Take 1 tablet (75 mg total) by mouth daily. Start on 03/29/15 30 tablet 3  . DULoxetine (CYMBALTA) 60 MG capsule Take 60 mg by mouth every morning.    Marland Kitchen ethosuximide (ZARONTIN) 250 MG capsule Take 250-500 mg by mouth 3 (three) times daily. Take 1 capsule (250mg ) in the morning, 2 capsules (500mg ) in the afternoon, and 2 capsules (500mg ) at night    . ezetimibe (ZETIA) 10 MG tablet Take 1 tablet (10 mg total) by mouth daily. 30 tablet 3  . levothyroxine (SYNTHROID, LEVOTHROID) 50 MCG tablet Take 50 mcg by mouth daily.     Marland Kitchen MINIVELLE 0.05 MG/24HR patch Apply 1 patch topically 2 (two) times a week. Monday and  Thursday  12  . nitroGLYCERIN (NITROSTAT) 0.4 MG SL tablet Place 1 tablet (0.4 mg total) under the tongue every 5 (five) minutes as needed. For chest pain. 25 tablet 6  . TEGRETOL-XR 100 MG 12 hr tablet TAKE FIVE TABLETS BY MOUTH TWICE A DAY **MUST SCHEDULE APPOINTMENT** 70 tablet 1  . tiZANidine (ZANAFLEX) 2 MG tablet Take 2-4 tablets by mouth at bedtime as needed for muscle spasms.   2   No current facility-administered medications for this visit.    Allergies: Allergies  Allergen Reactions  . Aspirin Other (See Comments)    GI upset  . Erythromycin     Gi upset  .  Imdur [Isosorbide Mononitrate]     Pt does not recall reaction  . Lisinopril     cough  . Niacin And Related     Pt does not recall reactions   . Penicillins     Gi upset  . Statins     Muscle pain  . Sulfa Drugs Cross Reactors Swelling  . Tetracyclines & Related     Social History: The patient  reports that she quit smoking about 33 years ago. Her smoking use included Cigarettes. She has a 6 pack-year smoking history. She has never used smokeless tobacco. She reports that she drinks about 0.6 oz of alcohol per week. She reports that she does not use illicit drugs.   Family History: The patient's family history includes Cancer in her mother; Heart attack in her father; Heart attack (age of onset: 24) in her brother; Heart disease in her father. There is no history of Stroke.   Review of Systems: Please see the history of present illness.   Otherwise, the review of systems is positive for none.   All other systems are reviewed and negative.   Physical Exam: VS:  BP 128/82 mmHg  Pulse 88  Ht 5\' 9"  (1.753 m)  Wt 190 lb 9.6 oz (86.456 kg)  BMI 28.13 kg/m2  SpO2 98% .  BMI Body mass index is 28.13 kg/(m^2).  Wt Readings from Last 3 Encounters:  04/05/15 190 lb 9.6 oz (86.456 kg)  03/26/15 195 lb 15.8 oz (88.9 kg)  03/24/15 192 lb 9.6 oz (87.363 kg)    General: Pleasant. Well developed, well nourished and in  no acute distress. She is a little anxious.  HEENT: Normal. Neck: Supple, no JVD, carotid bruits, or masses noted.  Cardiac: Regular rate and rhythm. No murmurs, rubs, or gallops. No edema. Pacemaker in the left upper chest looks good - steri strips in place Respiratory:  Lungs are clear to auscultation bilaterally with normal work of breathing.  GI: Soft and nontender.  MS: No deformity or atrophy. Gait and ROM intact. Skin: Warm and dry. Color is normal.  Neuro:  Strength and sensation are intact and no gross focal deficits noted.  Psych: Alert, appropriate and with normal affect.   LABORATORY DATA:  EKG:  EKG is ordered today. This shows NSR with inferior Q's. No acute changes.   Lab Results  Component Value Date   WBC 9.2 03/25/2015   HGB 12.4 03/25/2015   HCT 37.1 03/25/2015   PLT 302 03/25/2015   GLUCOSE 129* 03/26/2015   CHOL 248* 03/25/2015   TRIG 94 03/25/2015   HDL 65 03/25/2015   LDLCALC 164* 03/25/2015   ALT 15 03/25/2015   AST 13* 03/25/2015   NA 134* 03/26/2015   K 3.9 03/26/2015   CL 101 03/26/2015   CREATININE 0.76 03/26/2015   BUN 9 03/26/2015   CO2 26 03/26/2015   TSH 0.496 03/24/2015   INR 1.15 03/24/2015   HGBA1C 5.9* 03/24/2015   Lab Results  Component Value Date   CKTOTAL 26 08/17/2011   CKMB 1.7 08/17/2011   TROPONINI 0.18* 03/24/2015     BNP (last 3 results) No results for input(s): BNP in the last 8760 hours.  ProBNP (last 3 results) No results for input(s): PROBNP in the last 8760 hours.   Other Studies Reviewed Today: 1. Cardiac catheterization on 03/24/15 by Dr Fletcher Anon. This study demonstrated significant 2V CAD with chronically occluded mid LAD and proximal RCA at previously placed stent. Patent LIMA to LAD.  Occluded SVG to RCA which is subacute with reasonable left to right collaterals. There were no early apparent complications.  2. Implantation of a MDT dual chamber PPM on 03/25/15 by Dr Rayann Heman. The patient received a MDT model  number Advisa PPM with model number 5076 right atrial lead and 5076 right ventricular lead. There were no immediate post procedure complications.  2. CXR on 03/26/15 demonstrated no pneumothorax status post device implantation.   Assessment/Plan: 1. Chest pain - known CAD with recent cardiac cath showing occluded SVG to RCA felt to be subacute and has collaterals. She does not wish to take NTG - she feels her current symptoms are related to anxiety from what all has happened and from jsut being here in the office. She is encouraged to use NTG sl as needed - this is refilled for her today. Recheck her lab for follow up of Plavix. She is totally painfree by the end of her visit.   2. Recent cardiac cath - to manage medically  3. Recent PPM implant - device is checked by Juanda Crumble. No problems noted.   4. HLD - statin intolerant - on Zetia. May need to consider going back to lipid clinic to consider PCSK-9.   Current medicines are reviewed with the patient today.  The patient does not have concerns regarding medicines other than what has been noted above.  The following changes have been made:  See above.  Labs/ tests ordered today include:    Orders Placed This Encounter  Procedures  . Basic metabolic panel  . CBC  . EKG 12-Lead     Disposition:   FU with Dr. Rayann Heman as planned.  Will get her to see Dr. [name] in about one month.  Patient is agreeable to this plan and will call if any problems develop in the interim.   Signed: Burtis Junes, RN, ANP-C 04/05/2015 4:05 PM  [facility] [address] [place], [place]  [address] Phone: [phone] Fax: [phone]

## 2015-04-05 NOTE — Patient Instructions (Signed)
We will be checking the following labs today - BMET and CBC   Medication Instructions:    Continue with your current medicines.  I refilled your NTG today - this is at your drug store - Use your NTG under your tongue for recurrent chest pain. May take one tablet every 5 minutes. If you are still having discomfort after 3 tablets in 15 minutes, call 911.     Testing/Procedures To Be Arranged:  N/A  Follow-Up:   See Dr. [name] in one month  See Dr. Rayann Heman as planned.     Other Special Instructions:   N/A  Call the [facility] office at [phone] if you have any questions, problems or concerns.

## 2015-04-05 NOTE — Progress Notes (Signed)
Wound check appointment. Steri-strips removed. Wound without redness or edema. Incision edges approximated, wound well healed. Normal device function. Thresholds, sensing, and impedances consistent with implant measurements. Device programmed at 3.5V/auto capture programmed on for extra safety margin until 3 month visit. Histogram distribution appropriate for patient and level of activity. 1 AT/AF recording, 25sec. No high ventricular rates noted. Patient educated about wound care, arm mobility, lifting restrictions. ROV w/ JA 07/01/15.

## 2015-04-05 NOTE — Addendum Note (Signed)
Addended by: Eulis Foster on: 04/05/2015 04:34 PM   Modules accepted: Orders

## 2015-04-09 ENCOUNTER — Telehealth: Payer: Self-pay | Admitting: Neurology

## 2015-04-09 NOTE — Telephone Encounter (Signed)
Patient called to advise [facility] no longer carries TEGRETOL-XR 100 MG 12 hr tablet. Patient had Rx transferred to [facility] on [address].

## 2015-04-13 ENCOUNTER — Encounter: Payer: Self-pay | Admitting: *Deleted

## 2015-05-13 ENCOUNTER — Other Ambulatory Visit: Payer: Self-pay | Admitting: Neurology

## 2015-05-15 ENCOUNTER — Telehealth: Payer: Self-pay | Admitting: Diagnostic Neuroimaging

## 2015-05-15 MED ORDER — ETHOSUXIMIDE 250 MG PO CAPS: ORAL_CAPSULE | ORAL | Status: DC

## 2015-05-15 MED ORDER — CARBAMAZEPINE ER 100 MG PO TB12: ORAL_TABLET | ORAL | Status: DC

## 2015-05-15 NOTE — Telephone Encounter (Signed)
I returned pt call from answer service (apparently pt ran out of sz meds). No answer x 3. Will send in 1 month refills of both meds and forward to Dr. Jannifer Franklin.   Penni Bombard, MD [id number], [id number] PM Certified in Neurology, Neurophysiology and Neuroimaging  [facility] [address], [place], [place] [address] [phone]

## 2015-05-17 ENCOUNTER — Telehealth: Payer: Self-pay | Admitting: Diagnostic Neuroimaging

## 2015-05-17 NOTE — Telephone Encounter (Signed)
Pt called sts she has called pharmacies in [place] but no one has medication, no carrying it anymore. She is down to 3 tabs left, she take 10/day, she does not have enough for today. Please call. Thank you

## 2015-05-17 NOTE — Telephone Encounter (Signed)
I called the patient back again.  Got no answer.  Left message.  Asked that she call us back if she would still like Rx sent to [facility] mail order, and we will be happy to take care of that for her.

## 2015-05-17 NOTE — Telephone Encounter (Signed)
I called [facility] and spoke with the pharmacist.  He said the patient gets such a high quanitity, they just did not have enough meds in stock.  Indicated the drug has been ordered and will arrive tomorrow.  Stated if the patient calls them, they can give her a partial fill until the entire quantity arrives.  I called

## 2015-05-17 NOTE — Telephone Encounter (Signed)
Patient is calling and states that [facility] no longer carries Rx Carbamazepine 100 mg 12 hr tablets.  She states shecan get thru [facility] but needs medication until that time. Please advise her.

## 2015-05-17 NOTE — Telephone Encounter (Signed)
I called the patient back.  Got no answer, left message.  We can certainly send the Rx to [facility].  If patient needs some medication locally while waiting on mail order, we will just need to verify what pharmacy locally she would like the Rx sent to that carries her medication.

## 2015-05-17 NOTE — Telephone Encounter (Signed)
Pt called and I relayed msg. She was very upset so I relayed msg. She will call [facility]. Thank you!

## 2015-05-19 MED ORDER — CARBAMAZEPINE ER 100 MG PO TB12: ORAL_TABLET | ORAL | Status: DC

## 2015-05-19 NOTE — Telephone Encounter (Signed)
Rx has been sent to [facility] per patient request.  Receipt confirmed by pharmacy.

## 2015-05-19 NOTE — Telephone Encounter (Signed)
Pt called requesting RX to be sent to [facility] mail order

## 2015-05-31 ENCOUNTER — Telehealth: Payer: Self-pay | Admitting: Internal Medicine

## 2015-05-31 NOTE — Telephone Encounter (Signed)
lmom for patient to return my call tomorrow

## 2015-05-31 NOTE — Telephone Encounter (Signed)
New message      Pt is c/o of feeling lightheaded for the last 10 days.  She took a nitro on last Monday.  It did not really help.  Pt has not taken her bp.  Please advise

## 2015-06-04 NOTE — Telephone Encounter (Signed)
Left another message for patient to return my call Monday if she is still having problems.  I left her a message and never received a return call.

## 2015-07-01 ENCOUNTER — Ambulatory Visit: Payer: BLUE CROSS/BLUE SHIELD | Admitting: Internal Medicine

## 2015-07-23 ENCOUNTER — Other Ambulatory Visit: Payer: Self-pay | Admitting: Nurse Practitioner

## 2015-07-25 ENCOUNTER — Observation Stay (HOSPITAL_COMMUNITY)
Admission: EM | Admit: 2015-07-25 | Discharge: 2015-07-29 | Disposition: A | Discharge status: Home / Self Care | Payer: BLUE CROSS/BLUE SHIELD | Attending: Internal Medicine | Admitting: Internal Medicine

## 2015-07-25 ENCOUNTER — Encounter (HOSPITAL_COMMUNITY): Payer: Self-pay | Admitting: Emergency Medicine

## 2015-07-25 ENCOUNTER — Emergency Department (HOSPITAL_COMMUNITY): Payer: BLUE CROSS/BLUE SHIELD

## 2015-07-25 DIAGNOSIS — Z951 Presence of aortocoronary bypass graft: Secondary | ICD-10-CM | POA: Insufficient documentation

## 2015-07-25 DIAGNOSIS — I25110 Atherosclerotic heart disease of native coronary artery with unstable angina pectoris: Principal | ICD-10-CM | POA: Insufficient documentation

## 2015-07-25 DIAGNOSIS — I252 Old myocardial infarction: Secondary | ICD-10-CM | POA: Diagnosis not present

## 2015-07-25 DIAGNOSIS — R079 Chest pain, unspecified: Secondary | ICD-10-CM | POA: Insufficient documentation

## 2015-07-25 DIAGNOSIS — Z8673 Personal history of transient ischemic attack (TIA), and cerebral infarction without residual deficits: Secondary | ICD-10-CM | POA: Diagnosis not present

## 2015-07-25 DIAGNOSIS — Z85038 Personal history of other malignant neoplasm of large intestine: Secondary | ICD-10-CM | POA: Insufficient documentation

## 2015-07-25 DIAGNOSIS — F329 Major depressive disorder, single episode, unspecified: Secondary | ICD-10-CM | POA: Diagnosis not present

## 2015-07-25 DIAGNOSIS — G40909 Epilepsy, unspecified, not intractable, without status epilepticus: Secondary | ICD-10-CM | POA: Insufficient documentation

## 2015-07-25 DIAGNOSIS — M797 Fibromyalgia: Secondary | ICD-10-CM | POA: Diagnosis not present

## 2015-07-25 DIAGNOSIS — Z7902 Long term (current) use of antithrombotics/antiplatelets: Secondary | ICD-10-CM | POA: Diagnosis not present

## 2015-07-25 DIAGNOSIS — E785 Hyperlipidemia, unspecified: Secondary | ICD-10-CM | POA: Diagnosis not present

## 2015-07-25 DIAGNOSIS — D472 Monoclonal gammopathy: Secondary | ICD-10-CM | POA: Insufficient documentation

## 2015-07-25 DIAGNOSIS — K219 Gastro-esophageal reflux disease without esophagitis: Secondary | ICD-10-CM | POA: Diagnosis not present

## 2015-07-25 DIAGNOSIS — E039 Hypothyroidism, unspecified: Secondary | ICD-10-CM | POA: Diagnosis not present

## 2015-07-25 DIAGNOSIS — Z950 Presence of cardiac pacemaker: Secondary | ICD-10-CM | POA: Insufficient documentation

## 2015-07-25 DIAGNOSIS — I200 Unstable angina: Secondary | ICD-10-CM | POA: Diagnosis present

## 2015-07-25 DIAGNOSIS — I2582 Chronic total occlusion of coronary artery: Secondary | ICD-10-CM | POA: Diagnosis not present

## 2015-07-25 DIAGNOSIS — I442 Atrioventricular block, complete: Secondary | ICD-10-CM | POA: Insufficient documentation

## 2015-07-25 DIAGNOSIS — M19041 Primary osteoarthritis, right hand: Secondary | ICD-10-CM | POA: Diagnosis not present

## 2015-07-25 DIAGNOSIS — Z87891 Personal history of nicotine dependence: Secondary | ICD-10-CM | POA: Diagnosis not present

## 2015-07-25 DIAGNOSIS — Z955 Presence of coronary angioplasty implant and graft: Secondary | ICD-10-CM | POA: Insufficient documentation

## 2015-07-25 DIAGNOSIS — Z79899 Other long term (current) drug therapy: Secondary | ICD-10-CM | POA: Diagnosis not present

## 2015-07-25 DIAGNOSIS — M19042 Primary osteoarthritis, left hand: Secondary | ICD-10-CM | POA: Diagnosis not present

## 2015-07-25 DIAGNOSIS — I2510 Atherosclerotic heart disease of native coronary artery without angina pectoris: Secondary | ICD-10-CM | POA: Diagnosis present

## 2015-07-25 HISTORY — DX: Presence of cardiac pacemaker: Z95.0

## 2015-07-25 HISTORY — DX: Unstable angina: I20.0

## 2015-07-25 LAB — BASIC METABOLIC PANEL
Anion gap: 9 (ref 5–15)
BUN: 10 mg/dL (ref 6–20)
CHLORIDE: 105 mmol/L (ref 101–111)
CO2: 25 mmol/L (ref 22–32)
Calcium: 8.9 mg/dL (ref 8.9–10.3)
Creatinine, Ser: 0.62 mg/dL (ref 0.44–1.00)
GFR calc non Af Amer: >60 mL/min (ref >60)
Glucose, Bld: 144 mg/dL — ABNORMAL HIGH (ref 65–99)
POTASSIUM: 3.5 mmol/L (ref 3.5–5.1)
SODIUM: 139 mmol/L (ref 135–145)

## 2015-07-25 LAB — CBC
HCT: 41.4 % (ref 36.0–46.0)
HEMOGLOBIN: 14 g/dL (ref 12.0–15.0)
MCH: 29.9 pg (ref 26.0–34.0)
MCHC: 33.8 g/dL (ref 30.0–36.0)
MCV: 88.3 fL (ref 78.0–100.0)
Platelets: 288 10*3/uL (ref 150–400)
RBC: 4.69 MIL/uL (ref 3.87–5.11)
RDW: 13.2 % (ref 11.5–15.5)
WBC: 7 10*3/uL (ref 4.0–10.5)

## 2015-07-25 LAB — I-STAT TROPONIN, ED: TROPONIN I, POC: 0 ng/mL (ref 0.00–0.08)

## 2015-07-25 MED ORDER — TIZANIDINE HCL 4 MG PO TABS
4.0000 mg | ORAL_TABLET | Freq: Every day | ORAL | Status: DC
  Administered 2015-07-26: 8 mg via ORAL
  Administered 2015-07-26 – 2015-07-27 (×2): 4 mg via ORAL
  Administered 2015-07-28: 8 mg via ORAL
  Filled 2015-07-25 (×6): qty 2

## 2015-07-25 MED ORDER — ASPIRIN 81 MG PO CHEW
324.0000 mg | CHEWABLE_TABLET | Freq: Once | ORAL | Status: AC
  Administered 2015-07-25: 324 mg via ORAL
  Filled 2015-07-25: qty 4

## 2015-07-25 MED ORDER — NITROGLYCERIN 0.4 MG SL SUBL
0.4000 mg | SUBLINGUAL_TABLET | SUBLINGUAL | Status: DC | PRN
  Administered 2015-07-27: 0.4 mg via SUBLINGUAL
  Filled 2015-07-25: qty 1

## 2015-07-25 MED ORDER — NITROGLYCERIN 0.4 MG SL SUBL
0.4000 mg | SUBLINGUAL_TABLET | SUBLINGUAL | Status: DC | PRN
  Administered 2015-07-25: 0.4 mg via SUBLINGUAL
  Filled 2015-07-25: qty 1

## 2015-07-25 MED ORDER — ACETAMINOPHEN 325 MG PO TABS: 650.0000 mg | ORAL_TABLET | ORAL | Status: DC | PRN

## 2015-07-25 MED ORDER — ACETAMINOPHEN 500 MG PO TABS: 500.0000 mg | ORAL_TABLET | Freq: Every day | ORAL | Status: DC | PRN

## 2015-07-25 MED ORDER — ONDANSETRON HCL 4 MG/2ML IJ SOLN: 4.0000 mg | Freq: Four times a day (QID) | INTRAMUSCULAR | Status: DC | PRN

## 2015-07-25 MED ORDER — ENOXAPARIN SODIUM 40 MG/0.4ML ~~LOC~~ SOLN
40.0000 mg | Freq: Every day | SUBCUTANEOUS | Status: DC
  Administered 2015-07-26 – 2015-07-29 (×3): 40 mg via SUBCUTANEOUS
  Filled 2015-07-25 (×3): qty 0.4

## 2015-07-25 MED ORDER — EZETIMIBE 10 MG PO TABS
10.0000 mg | ORAL_TABLET | Freq: Every day | ORAL | Status: DC
  Administered 2015-07-26 – 2015-07-29 (×3): 10 mg via ORAL
  Filled 2015-07-25 (×3): qty 1

## 2015-07-25 MED ORDER — LEVOTHYROXINE SODIUM 50 MCG PO TABS
50.0000 ug | ORAL_TABLET | Freq: Every day | ORAL | Status: DC
  Administered 2015-07-26 – 2015-07-29 (×4): 50 ug via ORAL
  Filled 2015-07-25 (×4): qty 1

## 2015-07-25 MED ORDER — DULOXETINE HCL 60 MG PO CPEP
60.0000 mg | ORAL_CAPSULE | Freq: Every morning | ORAL | Status: DC
  Administered 2015-07-26 – 2015-07-29 (×3): 60 mg via ORAL
  Filled 2015-07-25: qty 1
  Filled 2015-07-25: qty 2
  Filled 2015-07-25: qty 1

## 2015-07-25 MED ORDER — ETHOSUXIMIDE 250 MG PO CAPS: 250.0000 mg | ORAL_CAPSULE | Freq: Every day | ORAL | Status: DC

## 2015-07-25 MED ORDER — CARBAMAZEPINE ER 400 MG PO TB12
500.0000 mg | ORAL_TABLET | Freq: Two times a day (BID) | ORAL | Status: DC
  Administered 2015-07-26 – 2015-07-29 (×8): 500 mg via ORAL
  Filled 2015-07-25 (×10): qty 1

## 2015-07-25 MED ORDER — CLOPIDOGREL BISULFATE 75 MG PO TABS
75.0000 mg | ORAL_TABLET | Freq: Every day | ORAL | Status: DC
  Administered 2015-07-26 – 2015-07-29 (×4): 75 mg via ORAL
  Filled 2015-07-25 (×4): qty 1

## 2015-07-25 NOTE — ED Notes (Signed)
C/o discomfort to center of chest that started this morning.  Took home NTG and went to bed.  States pain resolved.  4 hours later pain started again.  Took another NTG and pain did not resolve.  States pain has now decreased to 2/10.  Denies sob, nausea, vomiting.

## 2015-07-25 NOTE — ED Notes (Signed)
Report attempted. RN on floor was busy with patient.

## 2015-07-25 NOTE — H&P (Addendum)
History and Physical  Rachel Clark [id number] DOB: 07-28-1950 DOA: 07/25/2015  PCP: Vidal Schwalbe, MD   Chief Complaint: CP  History of Present Illness:  Patient is a 65 yo female with history of CAD s/p CABG and stent with LHC in 2016 showing severe 2 vessel obstructive disease managed medically, AVB s/p PPM in 2016 and epilepsy. Patient came with cc of substernal chest pain that is similar to her previous anginal chest pain that started at 10 am this morning and then at 4 pm , relieved both times with SL NG.  No dyspnea/cough/N/V/D/C/abd pain/Fever/chills. Pain is not positional and non radiating.   Review of Systems:  CONSTITUTIONAL:  No night sweats.  No fatigue, malaise, lethargy.  No fever or chills. Eyes:  No visual changes.  No eye pain.  No eye discharge.   ENT:    No epistaxis.  No sinus pain.  No sore throat.  No ear pain.  No congestion. RESPIRATORY:  No cough.  No wheeze.  No hemoptysis.  No shortness of breath. CARDIOVASCULAR:  +chest pains.  No palpitations. GASTROINTESTINAL:  No abdominal pain.  No nausea or vomiting.  No diarrhea or constipation.  No hematemesis.  No hematochezia.  No melena. GENITOURINARY:  No urgency.  No frequency.  No dysuria.  No hematuria.  No obstructive symptoms.  No discharge.  No pain.  No significant abnormal bleeding. MUSCULOSKELETAL:  No musculoskeletal pain.  No joint swelling.  No arthritis. NEUROLOGICAL:  No confusion.  No weakness. No headache. No seizure. PSYCHIATRIC:  No depression. No anxiety. No suicidal ideation. SKIN:  No rashes.  No lesions.  No wounds. ENDOCRINE:  No unexplained weight loss.  No polydipsia.  No polyuria.  No polyphagia. HEMATOLOGIC:  No anemia.  No purpura.  No petechiae.  No bleeding.  ALLERGIC AND IMMUNOLOGIC:  No pruritus.  No swelling Other:  Past Medical and Surgical History:   Past Medical History  Diagnosis Date  . Coronary artery disease     a. remote NSTEMI with PCI to the RCA;  b. S/P CABG in 2007; c. 2010 - 2 v CAD with L-LAD and S-RCA patent, small caliber Dx 70% (treated medically - no amenable to PCI), CFX free of significant disease, normal LVF. d. 10/2014 Lexi MV: EF 61%, no ischemia/infarct.  . Dyslipidemia   . Monoclonal gammopathy of unknown significance   . Colon cancer ([place]) 12/04/2011    s/p Laparoscopic-assisted transverse colectomy on 12/19/2011 by Dr. Donne Hazel.  pT3 N0 M0.   Marland Kitchen Hyperlipidemia   . PONV (postoperative nausea and vomiting)   . Fibromyalgia   . GERD (gastroesophageal reflux disease)   . Gastric ulcer   . Hypothyroid   . Depression   . Wears glasses   . Family history of adverse reaction to anesthesia     "son gets PONV"  . Iron deficiency anemia 11/17/2011  . [id number]     "maybe monthly" (10/26/2014)  . 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 Arthritis     hands (10/26/2014)   Past Surgical History  Procedure Laterality Date  . Posterior laminectomy / decompression cervical spine      20+yrs ago  . Colonoscopy    . Colon resection  12/19/2011    Procedure: COLON RESECTION LAPAROSCOPIC;  Surgeon: Rolm Bookbinder, MD;  Location: [facility];  Service: General;  Laterality: N/A;  laparoscopic hand assisted partial colon resection  . Portacath placement  06/04/2012    Procedure: INSERTION PORT-A-CATH;  Surgeon: Rolm Bookbinder, MD;  Location: [facility];  Service: General;  Laterality: N/A;  Insertion of port-a-cath   . Port-a-cath removal N/A 06/12/2013    Procedure: REMOVAL PORT-A-CATH;  Surgeon: Rolm Bookbinder, MD;  Location: [facility];  Service: General;  Laterality: N/A;  . Colon surgery    . Anterior cervical decomp/discectomy fusion  1980's  . Back surgery    . Vaginal hysterectomy    . Dilation and curettage of uterus  1973  . Coronary artery bypass graft  2007    x 2  . Cardiac catheterization  05/20/2009    obstructive native vessel disease  in LAD, RCA, and first diagonal, patent vein graft to distal RCA and LIMA to LAD,normal. ef 60%  . Coronary angioplasty with stent placement  ~ 2007    1 stent  . Cardiac catheterization N/A 03/24/2015    Procedure: Left Heart Cath and Cors/Grafts Angiography;  Surgeon: Wellington Hampshire, MD;  Location: [facility];  Service: Cardiovascular;  Laterality: N/A;  . Ep implantable device N/A 03/25/2015    Procedure: Pacemaker Implant;  Surgeon: Thompson Grayer, MD;  Location: [facility];  Service: Cardiovascular;  Laterality: N/A;    Social History:   reports that she quit smoking about 34 years ago. Her smoking use included Cigarettes. She has a 6 pack-year smoking history. She has never used smokeless tobacco. She reports that she drinks about 0.6 oz of alcohol per week. She reports that she does not use illicit drugs.   Allergies  Allergen Reactions  . Aspirin Other (See Comments)    GI upset  . Erythromycin Nausea Only    Gi upset  . Imdur [Isosorbide Mononitrate]     Pt does not recall reaction  . Lisinopril     cough  . Niacin And Related     Pt does not recall reactions   . Penicillins Nausea Only    Has patient had a PCN reaction causing immediate rash, facial/tongue/throat swelling, SOB or lightheadedness with hypotension: Yes Has patient had a PCN reaction causing severe rash involving mucus membranes or skin necrosis: No Has patient had a PCN reaction that required hospitalization No Has patient had a PCN reaction occurring within the last 10 years: No If all of the above answers are "NO", then may proceed with Cephalosporin use.   . Statins     Muscle pain  . Sulfa Drugs Cross Reactors Swelling  . Tetracyclines & Related Nausea Only    Family History  Problem Relation Age of Onset  . Heart attack Father   . Heart disease Father   . Heart attack Brother 27  . Cancer Mother     lung  . Stroke Neg Hx       Prior to Admission medications   Medication Sig  Start Date End Date Taking? Authorizing Provider  acetaminophen (TYLENOL) 500 MG tablet Take 500-1,000 mg by mouth daily as needed for mild pain or headache.   Yes Historical Provider, MD  Azelastine HCl 0.15 % SOLN Place 1 spray into the nose as needed (allergies).  12/22/14  Yes Historical Provider, MD  carbamazepine (TEGRETOL XR) 100 MG 12 hr tablet TAKE FIVE TABLETS BY MOUTH TWICE DAILY Patient taking differently: Take 500 mg by mouth 2 (two) times daily. TAKE FIVE TABLETS BY MOUTH TWICE DAILY 05/19/15  Yes Kathrynn Ducking, MD  clopidogrel (PLAVIX) 75 MG tablet TAKE 1 TABLET (75  MG TOTAL) BY MOUTH DAILY. START ON 03/29/15 07/23/15  Yes Peter M [name], MD  DULoxetine (CYMBALTA) 60 MG capsule Take 60 mg by mouth every morning.   Yes Historical Provider, MD  ethosuximide (ZARONTIN) 250 MG capsule Take 1 capsule (250mg ) in the morning, 2 capsules (500mg ) in the afternoon, and 2 capsules (500mg ) at night 05/15/15  Yes Penni Bombard, MD  ezetimibe (ZETIA) 10 MG tablet Take 1 tablet (10 mg total) by mouth daily. 07/09/14  Yes Peter M [name], MD  levothyroxine (SYNTHROID, LEVOTHROID) 50 MCG tablet Take 50 mcg by mouth daily.    Yes Historical Provider, MD  MINIVELLE 0.05 MG/24HR patch Apply 1 patch topically 2 (two) times a week. Monday and Thursday 12/08/14  Yes Historical Provider, MD  nitroGLYCERIN (NITROSTAT) 0.4 MG SL tablet Place 1 tablet (0.4 mg total) under the tongue every 5 (five) minutes as needed. For chest pain. 04/05/15  Yes Burtis Junes, NP  tiZANidine (ZANAFLEX) 2 MG tablet Take 2-4 tablets by mouth at bedtime.  01/06/15  Yes Historical Provider, MD    Physical Exam: BP 130/71 mmHg  Pulse 65  Temp(Src) 97.4 F (36.3 C) (Oral)  Resp 16  Ht 5\' 9"  (1.753 m)  Wt 86.183 kg (190 lb)  BMI 28.05 kg/m2  SpO2 98%  GENERAL : Well developed, well nourished, alert and cooperative, and appears to be in no acute distress. HEAD: normocephalic. EYES: vision is grossly intact. EARS:  hearing  grossly intact. NOSE: No nasal discharge. THROAT: Oral cavity and pharynx normal.  NECK: Neck supple CARDIAC: Normal S1 and S2. No S3, S4 or murmurs. Rhythm is regular. There is no peripheral edema, cyanosis or pallor.. LUNGS: Clear to auscultation  ABDOMEN: Positive bowel sounds. Soft, nondistended, nontender. No guarding or rebound. No masses.Marland Kitchen NEUROLOGICAL: The mental examination revealed the patient was oriented to person, place, and time.CN II-XII intact. Strength and sensation symmetric and intact throughout.  SKIN: Skin normal color, texture and turgor with no lesions or eruptions. PSYCHIATRIC:  The patient was able to demonstrate good judgement and reason, without hallucinations, abnormal affect or abnormal behaviors during the examination. Patient is not suicidal.          Labs on Admission:  Reviewed.   Radiological Exams on Admission: Dg Chest 2 View  07/25/2015  CLINICAL DATA:  Chest pain.  Coronary artery disease. EXAM: CHEST  2 VIEW COMPARISON:  03/26/2015 FINDINGS: The heart size and mediastinal contours are within normal limits. Both lungs are clear. No evidence of pneumothorax or hemothorax. Dual lead transvenous pacemaker remains in appropriate position. Prior CABG again noted. IMPRESSION: No active cardiopulmonary disease. Electronically Signed   By: Earle Gell M.D.   On: 07/25/2015 18:36    EKG:  Independently reviewed. NSR  Assessment/Plan  Chest pain: Cardiac/anginal , will r/o ACS with serial trops/EKG EKG is NRS. Initial trop is neg.  Resolved now, keep on SLNG prn, will start NG drip if pain became constant Consult to cardio LHC from 2016 ; Significant 2 vessel coronary artery disease with chronically occluded mid LAD an proximal right coronary artery at the previously placed stent. Patent LIMA to LAD. Occluded SVG to RCA which by appearance appears to be subacute. There are reasonable left-to-right collaterals. Recs was for medical management.  Keep on tele.    CAD s/p CABG and stenting: cont Plavix and ezetimide.   Hypothyroidism: cont synthroid   DVT prophylaxis: [place] enoxaparin  Consultants: Cardio  Code Status: Full  Gennaro Africa M.D [facility]

## 2015-07-25 NOTE — ED Provider Notes (Addendum)
CSN: [id number]     Arrival date & time 07/25/15  1724 History   First MD Initiated Contact with Patient 07/25/15 1945     Chief Complaint  Patient presents with  . Chest Pain     (Consider location/radiation/quality/duration/timing/severity/associated sxs/prior Treatment) HPI  65 year old female who presents with chest pain. History of CAD status post CABG and stenting on Plavix, dyslipidemia, AV block with permanent pacemaker, and epilepsy. States that she has been in her usual state of health. Today at around 10 AM, while at rest developed chest pressure and pain. Somewhat similar to her anginal equivalent, although less severe. She took a nitroglycerin and took a nap. Pain seemed to resolve when she had woken up. At around 4 PM, states that she had a worsening episode of stabbing chest pressure and pain, as if someone was sitting on her chest. Took one nitroglycerin, with mild relief of her pain. Was driven to the hospital by her husband for further evaluation. He did not have any diaphoresis, nausea or vomiting, shortness of breath, lightheadedness or near-syncope. Does state that she has had intermittent episodes of chest pressure, that typically does resolve with nitroglycerin over the past few months. Pain not worsened or brought on by exertional activity such as running with her dog. No leg swelling or leg pain, orthopnea or PND.  Past Medical History  Diagnosis Date  . Coronary artery disease     a. remote NSTEMI with PCI to the RCA; b. S/P CABG in 2007; c. 2010 - 2 v CAD with L-LAD and S-RCA patent, small caliber Dx 70% (treated medically - no amenable to PCI), CFX free of significant disease, normal LVF. d. 10/2014 Lexi MV: EF 61%, no ischemia/infarct.  . Dyslipidemia   . Monoclonal gammopathy of unknown significance   . Colon cancer ([place]) 12/04/2011    s/p Laparoscopic-assisted transverse colectomy on 12/19/2011 by Dr. Donne Hazel.  pT3 N0 M0.   Marland Kitchen Hyperlipidemia   . PONV (postoperative  nausea and vomiting)   . Fibromyalgia   . GERD (gastroesophageal reflux disease)   . Gastric ulcer   . Hypothyroid   . Depression   . Wears glasses   . Family history of adverse reaction to anesthesia     "son gets PONV"  . Iron deficiency anemia 11/17/2011  . [id number])     "maybe monthly" (10/26/2014)  . 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 Arthritis     hands (10/26/2014)   Past Surgical History  Procedure Laterality Date  . Posterior laminectomy / decompression cervical spine      20+yrs ago  . Colonoscopy    . Colon resection  12/19/2011    Procedure: COLON RESECTION LAPAROSCOPIC;  Surgeon: Rolm Bookbinder, MD;  Location: [facility];  Service: General;  Laterality: N/A;  laparoscopic hand assisted partial colon resection  . Portacath placement  06/04/2012    Procedure: INSERTION PORT-A-CATH;  Surgeon: Rolm Bookbinder, MD;  Location: [facility];  Service: General;  Laterality: N/A;  Insertion of port-a-cath   . Port-a-cath removal N/A 06/12/2013    Procedure: REMOVAL PORT-A-CATH;  Surgeon: Rolm Bookbinder, MD;  Location: [facility];  Service: General;  Laterality: N/A;  . Colon surgery    . Anterior cervical decomp/discectomy fusion  1980's  . Back surgery    . Vaginal hysterectomy    . Dilation and curettage of uterus  1973  . Coronary artery bypass graft  2007    x 2  . Cardiac catheterization  05/20/2009    obstructive native vessel disease in LAD, RCA, and first diagonal, patent vein graft to distal RCA and LIMA to LAD,normal. ef 60%  . Coronary angioplasty with stent placement  ~ 2007    1 stent  . Cardiac catheterization N/A 03/24/2015    Procedure: Left Heart Cath and Cors/Grafts Angiography;  Surgeon: Wellington Hampshire, MD;  Location: [facility];  Service: Cardiovascular;  Laterality: N/A;  . Ep implantable device N/A 03/25/2015    Procedure: Pacemaker Implant;  Surgeon: Thompson Grayer, MD;  Location: [facility];  Service: Cardiovascular;  Laterality: N/A;   Family History  Problem Relation Age of Onset  . Heart attack Father   . Heart disease Father   . Heart attack Brother 83  . Cancer Mother     lung  . Stroke Neg Hx    Social History  Substance Use Topics  . Smoking status: Former Smoker -- 0.30 packs/day for 20 years    Types: Cigarettes    Quit date: 06/06/1981  . Smokeless tobacco: Never Used  . Alcohol Use: 0.6 oz/week    1 Glasses of wine per week   OB History    No data available     Review of Systems 10/14 systems reviewed and are negative other than those stated in the HPI    Allergies  Aspirin; Erythromycin; Imdur; Lisinopril; Niacin and related; Penicillins; Statins; Sulfa drugs cross reactors; and Tetracyclines & related  Home Medications   Prior to Admission medications   Medication Sig Start Date End Date Taking? Authorizing Provider  acetaminophen (TYLENOL) 500 MG tablet Take 500-1,000 mg by mouth daily as needed for mild pain or headache.   Yes Historical Provider, MD  Azelastine HCl 0.15 % SOLN Place 1 spray into the nose as needed (allergies).  12/22/14  Yes Historical Provider, MD  carbamazepine (TEGRETOL XR) 100 MG 12 hr tablet TAKE FIVE TABLETS BY MOUTH TWICE DAILY Patient taking differently: Take 500 mg by mouth 2 (two) times daily. TAKE FIVE TABLETS BY MOUTH TWICE DAILY 05/19/15  Yes Kathrynn Ducking, MD  clopidogrel (PLAVIX) 75 MG tablet TAKE 1 TABLET (75 MG TOTAL) BY MOUTH DAILY. START ON 03/29/15 07/23/15  Yes Peter M [name], MD  DULoxetine (CYMBALTA) 60 MG capsule Take 60 mg by mouth every morning.   Yes Historical Provider, MD  ethosuximide (ZARONTIN) 250 MG capsule Take 1 capsule (250mg ) in the morning, 2 capsules (500mg ) in the afternoon, and 2 capsules (500mg ) at night 05/15/15  Yes Penni Bombard, MD  ezetimibe (ZETIA) 10 MG tablet Take 1 tablet (10 mg total) by mouth daily. 07/09/14  Yes Peter M [name], MD   levothyroxine (SYNTHROID, LEVOTHROID) 50 MCG tablet Take 50 mcg by mouth daily.    Yes Historical Provider, MD  MINIVELLE 0.05 MG/24HR patch Apply 1 patch topically 2 (two) times a week. Monday and Thursday 12/08/14  Yes Historical Provider, MD  nitroGLYCERIN (NITROSTAT) 0.4 MG SL tablet Place 1 tablet (0.4 mg total) under the tongue every 5 (five) minutes as needed. For chest pain. 04/05/15  Yes Burtis Junes, NP  tiZANidine (ZANAFLEX) 2 MG tablet Take 2-4 tablets by mouth at bedtime.  01/06/15  Yes Historical Provider, MD   BP 150/72 mmHg  Pulse 67  Temp(Src) 97.4 F (36.3 C) (Oral)  Resp 10  Ht 5\' 9"  (1.753 m)  Wt 190 lb (86.183 kg)  BMI 28.05  kg/m2  SpO2 100% Physical Exam Physical Exam  Nursing note and vitals reviewed. Constitutional: Well developed, well nourished, non-toxic, and in no acute distress Head: Normocephalic and atraumatic.  Mouth/Throat: Oropharynx is clear and moist.  Neck: Normal range of motion. Neck supple.  Cardiovascular: Normal rate and regular rhythm.  +2 radial pulses bilaterally Pulmonary/Chest: Effort normal and breath sounds normal.  Abdominal: Soft. There is no tenderness. There is no rebound and no guarding.  Musculoskeletal: Normal range of motion.  Neurological: Alert, no facial droop, fluent speech, moves all extremities symmetrically Skin: Skin is warm and dry.  Psychiatric: Cooperative  ED Course  Procedures (including critical care time) Labs Review Labs Reviewed  BASIC METABOLIC PANEL - Abnormal; Notable for the following:    Glucose, Bld 144 (*)    All other components within normal limits  CBC  I-STAT TROPOININ, ED    Imaging Review Dg Chest 2 View  07/25/2015  CLINICAL DATA:  Chest pain.  Coronary artery disease. EXAM: CHEST  2 VIEW COMPARISON:  03/26/2015 FINDINGS: The heart size and mediastinal contours are within normal limits. Both lungs are clear. No evidence of pneumothorax or hemothorax. Dual lead transvenous pacemaker  remains in appropriate position. Prior CABG again noted. IMPRESSION: No active cardiopulmonary disease. Electronically Signed   By: Earle Gell M.D.   On: 07/25/2015 18:36   I have personally reviewed and evaluated these images and lab results as part of my medical decision-making.   EKG Interpretation   Date/Time:  Sunday July 25 2015 17:28:52 EST Ventricular Rate:  92 PR Interval:  162 QRS Duration: 80 QT Interval:  326 QTC Calculation: 403 R Axis:   35 Text Interpretation:  Normal sinus rhythm Normal ECG Confirmed by Dymphna Wadley MD,  Hinton Dyer [id number]) on 07/25/2015 8:16:04 PM      MDM   Final diagnoses:  Chest pain, unspecified chest pain type  CAD, multiple vessel    66 year old female with extensive cardiac history who presents with chest pain. On arrival continues to have 4/10 chest pressure, that subsequently resolved with an additional nitroglycerin. Vital signs are stable, and she is nontoxic and in no acute distress. Cardiopulmonary exam overall unremarkable. Her EKG is not acutely ischemic and unchanged from prior. Troponin 1 is negative. Chest x-ray showing no acute cardiopulmonary processes. A pacemaker is also interrogated, showing no acute events today. Concerned that this is her anginal equivalent, with symptoms resolving with nitroglycerin. Admitted to [facility] for cardiac rule out and cardiology consult.   Forde Dandy, MD 07/26/15 0022  Forde Dandy, MD 07/26/15 [phone]

## 2015-07-26 ENCOUNTER — Encounter (HOSPITAL_COMMUNITY): Payer: Self-pay | Admitting: General Practice

## 2015-07-26 ENCOUNTER — Ambulatory Visit (HOSPITAL_BASED_OUTPATIENT_CLINIC_OR_DEPARTMENT_OTHER): Payer: BLUE CROSS/BLUE SHIELD

## 2015-07-26 DIAGNOSIS — R079 Chest pain, unspecified: Secondary | ICD-10-CM

## 2015-07-26 DIAGNOSIS — I2582 Chronic total occlusion of coronary artery: Secondary | ICD-10-CM | POA: Diagnosis not present

## 2015-07-26 DIAGNOSIS — E785 Hyperlipidemia, unspecified: Secondary | ICD-10-CM | POA: Diagnosis not present

## 2015-07-26 DIAGNOSIS — I25110 Atherosclerotic heart disease of native coronary artery with unstable angina pectoris: Secondary | ICD-10-CM | POA: Diagnosis not present

## 2015-07-26 DIAGNOSIS — G40909 Epilepsy, unspecified, not intractable, without status epilepticus: Secondary | ICD-10-CM | POA: Diagnosis not present

## 2015-07-26 LAB — TROPONIN I
Troponin I: <0.03 ng/mL (ref <0.031)
Troponin I: <0.03 ng/mL (ref <0.031)

## 2015-07-26 MED ORDER — ETHOSUXIMIDE 250 MG PO CAPS
250.0000 mg | ORAL_CAPSULE | Freq: Every day | ORAL | Status: DC
  Administered 2015-07-26 – 2015-07-29 (×4): 250 mg via ORAL
  Filled 2015-07-26 (×5): qty 1

## 2015-07-26 MED ORDER — ETHOSUXIMIDE 250 MG PO CAPS
500.0000 mg | ORAL_CAPSULE | Freq: Two times a day (BID) | ORAL | Status: DC
  Administered 2015-07-26 (×3): 500 mg via ORAL
  Administered 2015-07-27: 250 mg via ORAL
  Administered 2015-07-27 – 2015-07-29 (×4): 500 mg via ORAL
  Filled 2015-07-26 (×9): qty 2

## 2015-07-26 MED ORDER — METOPROLOL SUCCINATE ER 25 MG PO TB24
25.0000 mg | ORAL_TABLET | Freq: Every day | ORAL | Status: DC
  Administered 2015-07-26 – 2015-07-28 (×3): 25 mg via ORAL
  Filled 2015-07-26 (×3): qty 1

## 2015-07-26 NOTE — Progress Notes (Signed)
    Subjective:  Has had "twinges" of CP; no dyspnea.   Objective:  Filed Vitals:   07/25/15 2230 07/25/15 2245 07/25/15 2331 07/26/15 0457  BP: 149/74 146/74 135/56 145/61  Pulse: 68 69 67 71  Temp:   97.6 F (36.4 C) 97.7 F (36.5 C)  TempSrc:   Oral Oral  Resp:   18 17  Height:      Weight:      SpO2: 98% 99% 100% 97%    Intake/Output from previous day: No intake or output data in the 24 hours ending 07/26/15 1220  Physical Exam: Physical exam: Well-developed well-nourished in no acute distress.  Skin is warm and dry.  HEENT is normal.  Neck is supple.  Chest is clear to auscultation with normal expansion.  Cardiovascular exam is regular rate and rhythm.  Abdominal exam nontender or distended. No masses palpated. Extremities show no edema. neuro grossly intact    Lab Results: Basic Metabolic Panel:  Recent Labs  07/25/15 1739  NA 139  K 3.5  CL 105  CO2 25  GLUCOSE 144*  BUN 10  CREATININE 0.62  CALCIUM 8.9   CBC:  Recent Labs  07/25/15 1739  WBC 7.0  HGB 14.0  HCT 41.4  MCV 88.3  PLT 288   Cardiac Enzymes:  Recent Labs  07/26/15 0110 07/26/15 0615  TROPONINI <0.03 <0.03     Assessment/Plan:  1 chest pain-etiology unclear. Her symptoms are questionably similar to previous infarct but not identical. She had several bouts of pain yesterday one lasting 3 hours. Her enzymes are negative. Her electrocardiogram is not diagnostic. Previous catheterization 9/16 as outlined with patent LIMA to the LAD and occluded saphenous vein graft to the right coronary artery with left-to-right collaterals. We will plan a stress nuclear study tomorrow morning to see if this will guide [place]. If there is ischemia in the inferior distribution then attempt at PCI of  CTO RCA may be reasonable. If there is ischemia in other distributions we could also consider repeat cardiac catheterization. Continue Plavix and beta blocker. Intolerant to statins. 2 coronary artery  disease-continue Plavix. Intolerant to statins. 3 H/O pacemaker   Rachel Clark 07/26/2015, 12:20 PM

## 2015-07-26 NOTE — Progress Notes (Signed)
  2D Echocardiogram has been performed.  Bobbye Charleston 07/26/2015, 11:35 AM

## 2015-07-26 NOTE — Progress Notes (Signed)
[facility] Progress Note  Patient: Rachel Clark [id number]   PCP: Vidal Schwalbe, MD DOB: 12-07-1950   DOA: 07/25/2015   DOS: 07/26/2015   Date of Service: the patient was seen and examined on 07/26/2015  Subjective: chest pain free, no shortness of breath, no fever or chills. Nutrition: was NPO last night Activity: ambulating in the room Last BM: 07/24/2015  Assessment and Plan: 1. Unstable angina ([place]) Ruled out for ACS, negative troponin and no chest pain and hemodynamically stable. Cardiology consult appreciated, patient will undergo stress test tomorrow AM and further work up will be depending on result of the stress test.  2. Hypothyroidism Continue synthroid  3. Mood disorder Continue cymbalta  4. Seizure disorder Continue tegretol and zarontin  5. Dyslipidemia Continue zetia.  DVT Prophylaxis: subcutaneous Heparin Nutrition: cardiac diet, NPO after midnight Advance goals of care discussion: full code  Brief Summary of Hospitalization:  HPI: As per the H and P dictated on admission, "Patient is a 65 yo female with history of CAD s/p CABG and stent with LHC in 2016 showing severe 2 vessel obstructive disease managed medically, AVB s/p PPM in 2016 and epilepsy. Patient came with cc of substernal chest pain that is similar to her previous anginal chest pain that started at 10 am this morning and then at 4 pm , relieved both times with SL NG. No dyspnea/cough/N/V/D/C/abd pain/Fever/chills. Pain is not positional and non radiating" Daily update, Procedures: 1/23/201 rule out for ACS Consultants: cardiology Antibiotics: Anti-infectives    None      Family Communication: no family was present at bedside, at the time of interview.   Disposition:  Expected discharge date:07/27/2015 Barriers to safe discharge: pending the result of the stress test.  No intake or output data in the 24 hours ending 07/26/15 1354 Filed Weights   07/25/15 1740  Weight:  86.183 kg (190 lb)   Objective: Physical Exam: Filed Vitals:   07/25/15 2230 07/25/15 2245 07/25/15 2331 07/26/15 0457  BP: 149/74 146/74 135/56 145/61  Pulse: 68 69 67 71  Temp:   97.6 F (36.4 C) 97.7 F (36.5 C)  TempSrc:   Oral Oral  Resp:   18 17  Height:      Weight:      SpO2: 98% 99% 100% 97%   General: Appear in no distress, no Rash; Oral Mucosa moist. Cardiovascular: S1 and S2 Present, no Murmur, no JVD Respiratory: Bilateral Air entry present and Clear to Auscultation, no Crackles, no wheezes Abdomen: Bowel Sound present, Soft and no tenderness Extremities: no Pedal edema, no calf tenderness Neurology: Grossly no focal neuro deficit.  Data Reviewed: CBC:  Recent Labs Lab 07/25/15 1739  WBC 7.0  HGB 14.0  HCT 41.4  MCV 88.3  PLT 123XX123   Basic Metabolic Panel:  Recent Labs Lab 07/25/15 1739  NA 139  K 3.5  CL 105  CO2 25  GLUCOSE 144*  BUN 10  CREATININE 0.62  CALCIUM 8.9   Liver Function Tests: No results for input(s): AST, ALT, ALKPHOS, BILITOT, PROT, ALBUMIN in the last 168 hours. No results for input(s): LIPASE, AMYLASE in the last 168 hours. No results for input(s): AMMONIA in the last 168 hours.  Cardiac Enzymes:  Recent Labs Lab 07/26/15 0110 07/26/15 0615  TROPONINI <0.03 <0.03    BNP (last 3 results) No results for input(s): BNP in the last 8760 hours.  CBG: No results for input(s): GLUCAP in the last 168 hours.  No results found for  this or any previous visit (from the past 240 hour(s)).   Studies: Dg Chest 2 View  07/25/2015  CLINICAL DATA:  Chest pain.  Coronary artery disease. EXAM: CHEST  2 VIEW COMPARISON:  03/26/2015 FINDINGS: The heart size and mediastinal contours are within normal limits. Both lungs are clear. No evidence of pneumothorax or hemothorax. Dual lead transvenous pacemaker remains in appropriate position. Prior CABG again noted. IMPRESSION: No active cardiopulmonary disease. Electronically Signed   By: Earle Gell M.D.   On: 07/25/2015 18:36     Scheduled Meds: . carbamazepine  500 mg Oral BID  . clopidogrel  75 mg Oral Daily  . DULoxetine  60 mg Oral q morning - 10a  . enoxaparin (LOVENOX) injection  40 mg Subcutaneous Daily  . ethosuximide  250 mg Oral Q breakfast   And  . ethosuximide  500 mg Oral BID  . ezetimibe  10 mg Oral Daily  . levothyroxine  50 mcg Oral QAC breakfast  . metoprolol succinate  25 mg Oral Daily  . tiZANidine  4-8 mg Oral QHS   Continuous Infusions:  PRN Meds: acetaminophen, nitroGLYCERIN, ondansetron (ZOFRAN) IV  Time spent: 30 minutes  Author: Berle Mull, MD [facility] Pager: [phone] 07/26/2015 1:54 PM  If 7PM-7AM, please contact night-coverage at [email or web address], password [facility]

## 2015-07-26 NOTE — H&P (Signed)
Cardiology Consult Note  Admit date: 07/25/2015 Name: Rachel Clark 65 y.o.  female DOB:  [birth year] MRN:  [id number]  Today's date:  07/26/2015  Referring Physician:    [facility]  Primary Physician:  Dr. Harlan Stains  Reason for Consultation:   Chest pain   IMPRESSIONS: 1.  Chest pain consistent with unstable angina pectoris today relieved with nitroglycerin 2.  Coronary artery disease with previous bypass grafting and known occlusion of the graft to the right coronary artery, moderately severe stenosis in mid circumflex 3.  Hyperlipidemia with statin intolerance 4.  History of colon cancer 5.  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  Seizure disorder 7.  Fibromyalgia 8.  Hypothyroidism  RECOMMENDATION: The patient has initially negative cardiac enzymes and unremarkable EKG.  Chest discomfort is suspicious for angina at rest relieved with nitroglycerin.  Catheterization recently done shows occlusion of the graft to the right coronary artery.  I would favor observation overnight with serial enzymes and review of cath films in the morning.  Don't see a role role for stress testing here.  Options could be either repeat catheterization or intensification of medical therapy depending on what her symptoms are.  HISTORY: This 65 year old female had bypass grafting in 2007 with a mammary graft to the LAD and a vein graft to right coronary artery.  She later had stage III colon cancer and had surgery followed by chemotherapy.  She has hyperlipidemia with statin intolerance and relatively preserved LV function.  She had a syncopal episode as well as angina and had catheterization in September this past year showing occlusion of the graft to the right coronary artery.  She had moderate stenosis in the circumflex and some disease and some small diagonal branches.  The mammary graft was patent.  Since then she has done well but intermittently  will describe a sensation as if she has a hot flash from her knees up to her chest that lasts about every 2 weeks and describes it as a weird feeling.  She was at rest this morning and had the onset of substernal chest discomfort and took a nitroglycerin and went to sleep.  When she awoke from her sleep she was having no chest pain but had recurrence of chest pain this afternoon again relieved with nitroglycerin.  She then had another episode of pain that was more prolonged and EMS was called and lasted about an hour.  She was brought to the emergency room or the pain resolved again after nitroglycerin but had a recurrent episode in the emergency room following nitroglycerin.  She does not have any exertional angina that I can tell.  No PND or orthopnea or edema.  No recent change in her medications.  Past Medical History  Diagnosis Date  . Coronary artery disease     a. remote NSTEMI with PCI to the RCA; b. S/P CABG in 2007; c. 2010 - 2 v CAD with L-LAD and S-RCA patent, small caliber Dx 70% (treated medically - no amenable to PCI), CFX free of significant disease, normal LVF. d. 10/2014 Lexi MV: EF 61%, no ischemia/infarct.  . Dyslipidemia   . Monoclonal gammopathy of unknown significance   . Colon cancer ([place]) 12/04/2011    s/p Laparoscopic-assisted transverse colectomy on 12/19/2011 by Dr. Donne Hazel.  pT3 N0 M0.   Marland Kitchen Hyperlipidemia   . Fibromyalgia   . GERD (gastroesophageal reflux disease)   . Gastric ulcer   . Hypothyroid   . Depression   .  Iron deficiency anemia 11/17/2011  . 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 Posterior laminectomy / decompression cervical spine      20+yrs ago  . Colonoscopy    . Colon resection  12/19/2011    Procedure: COLON RESECTION LAPAROSCOPIC;  Surgeon: Rolm Bookbinder, MD;  Location: [place];  Service: General;  Laterality: N/A;  laparoscopic  hand assisted partial colon resection  . Portacath placement  06/04/2012    Procedure: INSERTION PORT-A-CATH;  Surgeon: Rolm Bookbinder, MD;  Location: [place];  Service: General;  Laterality: N/A;  Insertion of port-a-cath   . Port-a-cath removal N/A 06/12/2013    Procedure: REMOVAL PORT-A-CATH;  Surgeon: Rolm Bookbinder, MD;  Location: [place];  Service: General;  Laterality: N/A;  . Colon surgery    . Anterior cervical decomp/discectomy fusion  1980's  . Back surgery    . Vaginal hysterectomy    . Dilation and curettage of uterus  1973  . Coronary artery bypass graft  2007    x 2  . Cardiac catheterization  05/20/2009    obstructive native vessel disease in LAD, RCA, and first diagonal, patent vein graft to distal RCA and LIMA to LAD,normal. ef 60%  . Coronary angioplasty with stent placement  ~ 2007    1 stent  . Cardiac catheterization N/A 03/24/2015    Procedure: Left Heart Cath and Cors/Grafts Angiography;  Surgeon: Wellington Hampshire, MD;  Location: [place] [facility];  Service: Cardiovascular;  Laterality: N/A;  . Ep implantable device N/A 03/25/2015    Procedure: Pacemaker Implant;  Surgeon: Thompson Grayer, MD;  Location: [place];  Service: Cardiovascular;  Laterality: N/A;     Allergies:  is allergic to aspirin; erythromycin; imdur; lisinopril; niacin and related; penicillins; statins; sulfa drugs cross reactors; and tetracyclines & related.   Medications: Prior to Admission medications   Medication Sig Start Date End Date Taking? Authorizing Provider  acetaminophen (TYLENOL) 500 MG tablet Take 500-1,000 mg by mouth daily as needed for mild pain or headache.   Yes Historical Provider, MD  Azelastine HCl 0.15 % SOLN Place 1 spray into the nose as needed (allergies).  12/22/14  Yes Historical Provider, MD  carbamazepine (TEGRETOL XR) 100 MG 12 hr tablet TAKE FIVE TABLETS BY MOUTH TWICE DAILY Patient taking differently: Take 500 mg by mouth 2 (two) times  daily. TAKE FIVE TABLETS BY MOUTH TWICE DAILY 05/19/15  Yes Kathrynn Ducking, MD  clopidogrel (PLAVIX) 75 MG tablet TAKE 1 TABLET (75 MG TOTAL) BY MOUTH DAILY. START ON 03/29/15 07/23/15  Yes Peter M [name], MD  DULoxetine (CYMBALTA) 60 MG capsule Take 60 mg by mouth every morning.   Yes Historical Provider, MD  ethosuximide (ZARONTIN) 250 MG capsule Take 1 capsule (250mg ) in the morning, 2 capsules (500mg ) in the afternoon, and 2 capsules (500mg ) at night 05/15/15  Yes Penni Bombard, MD  ezetimibe (ZETIA) 10 MG tablet Take 1 tablet (10 mg total) by mouth daily. 07/09/14  Yes Peter M [name], MD  levothyroxine (SYNTHROID, LEVOTHROID) 50 MCG tablet Take 50 mcg by mouth daily.    Yes Historical Provider, MD  MINIVELLE 0.05 MG/24HR patch Apply 1 patch topically 2 (two) times a week. Monday and Thursday 12/08/14  Yes Historical Provider, MD  nitroGLYCERIN (NITROSTAT) 0.4 MG SL tablet Place 1 tablet (0.4 mg total) under  the tongue every 5 (five) minutes as needed. For chest pain. 04/05/15  Yes Burtis Junes, NP  tiZANidine (ZANAFLEX) 2 MG tablet Take 2-4 tablets by mouth at bedtime.  01/06/15  Yes Historical Provider, MD   Family History: Family Status  Relation Status Death Age  . Father Deceased 58  . Brother Alive   . Mother Deceased     lung cancer  . Sister Alive   . Brother Alive   . Maternal Grandmother Deceased   . Maternal Grandfather Deceased   . Paternal Grandmother Deceased   . Paternal Grandfather Deceased    Social History:   reports that she quit smoking about 34 years ago. Her smoking use included Cigarettes. She has a 6 pack-year smoking history. She has never used smokeless tobacco. She reports that she drinks about 0.6 oz of alcohol per week. She reports that she does not use illicit drugs.   Social History   Social History Narrative   Patient is married with 2 children.   Patient is right handed.   Patient has a high school education with some college education.   Patient  drinks 2 cups daily.    Review of Systems: She has fibromyalgia with diffuse aching.  Statin intolerance but not willing to take the PCS K inhibitors.  She has occasional arthritis in her hands.  No recent syncope or seizure.  She does have history of reflux but no recent symptoms.  Wears glasses.  Other than as noted above the remainder of the review of systems is unremarkable.  Physical Exam: BP 135/56 mmHg  Pulse 67  Temp(Src) 97.6 F (36.4 C) (Oral)  Resp 18  Ht 5\' 9"  (1.753 m)  Wt 86.183 kg (190 lb)  BMI 28.05 kg/m2  SpO2 100%  General appearance: Pleasant overweight white female in no acute distress Head: Normocephalic, without obvious abnormality, atraumatic Eyes: conjunctivae/corneas clear. PERRL, EOM's intact.  Not examined . Neck: no adenopathy, no carotid bruit, no JVD and supple, symmetrical, trachea midline Lungs: clear to auscultation bilaterally, healed median sternotomy scar Heart: regular rate and rhythm, S1, S2 normal, no murmur, click, rub or gallop Abdomen: soft, non-tender; bowel sounds normal; no masses,  no organomegaly Pelvic: deferred Extremities: extremities normal, atraumatic, no cyanosis or edema, healed scar from previous saphenous vein harvesting Pulses: 2+ and symmetric Skin: Skin color, texture, turgor normal. No rashes or lesions Neurologic: Grossly normal   Labs: CBC  Recent Labs  07/25/15 1739  WBC 7.0  RBC 4.69  HGB 14.0  HCT 41.4  PLT 288  MCV 88.3  MCH 29.9  MCHC 33.8  RDW 13.2   CMP   Recent Labs  07/25/15 1739  NA 139  K 3.5  CL 105  CO2 25  GLUCOSE 144*  BUN 10  CREATININE 0.62  CALCIUM 8.9  GFRNONAA >60  GFRAA >60   Troponin (Point of Care Test)  Recent Labs  07/25/15 2008  [place] 0.00   Radiology: Pacemaker present, no active cardiopulmonary disease  EKG: Sinus rhythm with nonspecific ST and T-wave changes  Signed:  W. Doristine Church MD [facility]   Cardiology Consultant  07/26/2015, 12:01  AM

## 2015-07-27 ENCOUNTER — Encounter (HOSPITAL_COMMUNITY): Payer: BLUE CROSS/BLUE SHIELD

## 2015-07-27 ENCOUNTER — Observation Stay (HOSPITAL_COMMUNITY): Payer: BLUE CROSS/BLUE SHIELD

## 2015-07-27 DIAGNOSIS — I2510 Atherosclerotic heart disease of native coronary artery without angina pectoris: Secondary | ICD-10-CM | POA: Insufficient documentation

## 2015-07-27 DIAGNOSIS — I200 Unstable angina: Secondary | ICD-10-CM | POA: Diagnosis not present

## 2015-07-27 DIAGNOSIS — Z950 Presence of cardiac pacemaker: Secondary | ICD-10-CM | POA: Diagnosis not present

## 2015-07-27 DIAGNOSIS — I25110 Atherosclerotic heart disease of native coronary artery with unstable angina pectoris: Secondary | ICD-10-CM | POA: Diagnosis not present

## 2015-07-27 DIAGNOSIS — R079 Chest pain, unspecified: Secondary | ICD-10-CM | POA: Insufficient documentation

## 2015-07-27 LAB — CBC WITH DIFFERENTIAL/PLATELET
BASOS PCT: 0 %
Basophils Absolute: 0 10*3/uL (ref 0.0–0.1)
Eosinophils Absolute: 0.2 10*3/uL (ref 0.0–0.7)
Eosinophils Relative: 3 %
HEMATOCRIT: 44.2 % (ref 36.0–46.0)
HEMOGLOBIN: 15 g/dL (ref 12.0–15.0)
LYMPHS ABS: 2.6 10*3/uL (ref 0.7–4.0)
Lymphocytes Relative: 36 %
MCH: 29.7 pg (ref 26.0–34.0)
MCHC: 33.9 g/dL (ref 30.0–36.0)
MCV: 87.5 fL (ref 78.0–100.0)
MONOS PCT: 7 %
Monocytes Absolute: 0.5 10*3/uL (ref 0.1–1.0)
NEUTROS ABS: 3.9 10*3/uL (ref 1.7–7.7)
NEUTROS PCT: 54 %
Platelets: 277 10*3/uL (ref 150–400)
RBC: 5.05 MIL/uL (ref 3.87–5.11)
RDW: 12.9 % (ref 11.5–15.5)
WBC: 7.3 10*3/uL (ref 4.0–10.5)

## 2015-07-27 LAB — NM MYOCAR MULTI W/SPECT W/WALL MOTION / EF
CHL CUP RESTING HR STRESS: 80 {beats}/min
CSEPED: 4 min
CSEPEDS: 50 s
Estimated workload: 6.8 METS
MPHR: 154 {beats}/min
Peak HR: 150 {beats}/min
Percent HR: 97 %
RPE: 19

## 2015-07-27 LAB — BASIC METABOLIC PANEL
Anion gap: 10 (ref 5–15)
BUN: 10 mg/dL (ref 6–20)
CO2: 24 mmol/L (ref 22–32)
Calcium: 9.2 mg/dL (ref 8.9–10.3)
Chloride: 103 mmol/L (ref 101–111)
Creatinine, Ser: 0.63 mg/dL (ref 0.44–1.00)
GFR calc Af Amer: >60 mL/min (ref >60)
GFR calc non Af Amer: >60 mL/min (ref >60)
Glucose, Bld: 98 mg/dL (ref 65–99)
Potassium: 4.1 mmol/L (ref 3.5–5.1)
Sodium: 137 mmol/L (ref 135–145)

## 2015-07-27 LAB — GLUCOSE, CAPILLARY: Glucose-Capillary: 140 mg/dL — ABNORMAL HIGH (ref 65–99)

## 2015-07-27 MED ORDER — TECHNETIUM TC 99M SESTAMIBI GENERIC - CARDIOLITE
10.0000 | Freq: Once | INTRAVENOUS | Status: AC | PRN
  Administered 2015-07-27: 10 via INTRAVENOUS

## 2015-07-27 MED ORDER — REGADENOSON 0.4 MG/5ML IV SOLN
INTRAVENOUS | Status: AC
  Filled 2015-07-27: qty 5

## 2015-07-27 MED ORDER — SODIUM CHLORIDE 0.9% FLUSH
3.0000 mL | Freq: Two times a day (BID) | INTRAVENOUS | Status: DC
  Administered 2015-07-27: 3 mL via INTRAVENOUS

## 2015-07-27 MED ORDER — SODIUM CHLORIDE 0.9% FLUSH: 3.0000 mL | INTRAVENOUS | Status: DC | PRN

## 2015-07-27 MED ORDER — SODIUM CHLORIDE 0.9 % WEIGHT BASED INFUSION
3.0000 mL/kg/h | INTRAVENOUS | Status: DC
  Administered 2015-07-28: 3 mL/kg/h via INTRAVENOUS

## 2015-07-27 MED ORDER — SODIUM CHLORIDE 0.9 % WEIGHT BASED INFUSION: 1.0000 mL/kg/h | INTRAVENOUS | Status: DC

## 2015-07-27 MED ORDER — ASPIRIN 81 MG PO CHEW
81.0000 mg | CHEWABLE_TABLET | ORAL | Status: AC
  Administered 2015-07-28: 81 mg via ORAL
  Filled 2015-07-27: qty 1

## 2015-07-27 MED ORDER — SODIUM CHLORIDE 0.9 % IV SOLN: 250.0000 mL | INTRAVENOUS | Status: DC | PRN

## 2015-07-27 MED ORDER — TECHNETIUM TC 99M SESTAMIBI GENERIC - CARDIOLITE
30.0000 | Freq: Once | INTRAVENOUS | Status: AC | PRN
  Administered 2015-07-27: 30 via INTRAVENOUS

## 2015-07-27 NOTE — Progress Notes (Signed)
[facility] Progress Note  Patient: Rachel Clark [id number]   PCP: Vidal Schwalbe, MD DOB: 14-Dec-1950   DOA: 07/25/2015   DOS: 07/27/2015   Date of Service: the patient was seen and examined on 07/27/2015  Subjective: did have one episode of chest pain in the afternoon resolved with nitro, will need nitro ointment if has recurrent chest pain Nutrition: able to tolerate oral diet Activity: ambulating in the room Last BM: 07/24/2015  Assessment and Plan: 1. Unstable angina ([place]) Ruled out for ACS, negative troponin and no chest pain and hemodynamically stable. Cardiology consult appreciated, stress test concerning for ischemia and patient will undergo cardiac cath tomorrow. Continue current medication  2. Hypothyroidism Continue synthroid  3. Mood disorder Continue cymbalta  4. Seizure disorder Continue tegretol and zarontin  5. Dyslipidemia Continue zetia.  DVT Prophylaxis: subcutaneous Heparin Nutrition: cardiac diet, NPO after midnight Advance goals of care discussion: full code  Brief Summary of Hospitalization:  HPI: As per the H and P dictated on admission, "Patient is a 65 yo female with history of CAD s/p CABG and stent with LHC in 2016 showing severe 2 vessel obstructive disease managed medically, AVB s/p PPM in 2016 and epilepsy. Patient came with cc of substernal chest pain that is similar to her previous anginal chest pain that started at 10 am this morning and then at 4 pm , relieved both times with SL NG. No dyspnea/cough/N/V/D/C/abd pain/Fever/chills. Pain is not positional and non radiating" Daily update, Procedures: 1/23/201 rule out for ACS 07/27/2015 NM stress test Moderate ischemia in the inferior wall. There is also a moderate size defect in the anteroseptal wall concerning for scar.  Consultants: cardiology Antibiotics: Anti-infectives    None      Family Communication: family was present at bedside, at the time of interview. All questions  were answered satisfactorily   Disposition:  Expected discharge date:07/28/2015 Barriers to safe discharge: pending the result of the cardiac cath  No intake or output data in the 24 hours ending 07/27/15 1737 Filed Weights   07/25/15 1740  Weight: 86.183 kg (190 lb)   Objective: Physical Exam: Filed Vitals:   07/27/15 1025 07/27/15 1028 07/27/15 1031 07/27/15 1032  BP: 147/75 218/99 226/110 186/101  Pulse:      Temp:      TempSrc:      Resp:      Height:      Weight:      SpO2:       General: Appear in no distress, no Rash; Oral Mucosa moist. Cardiovascular: S1 and S2 Present, no Murmur, no JVD Respiratory: Bilateral Air entry present and Clear to Auscultation, no Crackles, no wheezes Abdomen: Bowel Sound present, Soft and no tenderness Extremities: no Pedal edema, no calf tenderness  Data Reviewed: CBC:  Recent Labs Lab 07/25/15 1739 07/27/15 0745  WBC 7.0 7.3  NEUTROABS  --  3.9  HGB 14.0 15.0  HCT 41.4 44.2  MCV 88.3 87.5  PLT 288 [id number]   Basic Metabolic Panel:  Recent Labs Lab 07/25/15 1739 07/27/15 0745  NA 139 137  K 3.5 4.1  CL 105 103  CO2 25 24  GLUCOSE 144* 98  BUN 10 10  CREATININE 0.62 0.63  CALCIUM 8.9 9.2   Liver Function Tests: No results for input(s): AST, ALT, ALKPHOS, BILITOT, PROT, ALBUMIN in the last 168 hours. No results for input(s): LIPASE, AMYLASE in the last 168 hours. No results for input(s): AMMONIA in the last 168 hours.  Cardiac  Enzymes:  Recent Labs Lab 07/26/15 0110 07/26/15 0615 07/26/15 1245  TROPONINI <0.03 <0.03 <0.03   BNP (last 3 results) No results for input(s): BNP in the last 8760 hours.  CBG:  Recent Labs Lab 07/27/15 1130  GLUCAP 140*   No results found for this or any previous visit (from the past 240 hour(s)).   Studies: Nm Myocar Multi W/spect W/wall Motion / Ef  07/27/2015  CLINICAL DATA:  Chest pain. Myocardial infarction. Prior cardiac catheterization. Coronary artery disease. EXAM:  MYOCARDIAL IMAGING WITH SPECT (REST AND EXERCISE) GATED LEFT VENTRICULAR WALL MOTION STUDY LEFT VENTRICULAR EJECTION FRACTION TECHNIQUE: Standard myocardial SPECT imaging was performed after resting intravenous injection of 10 mCi Tc-55m sestamibi. Subsequently, exercise tolerance test was performed by the patient under the supervision of the Cardiology staff. At peak-stress, 30 mCi Tc-42m sestamibi was injected intravenously and standard myocardial SPECT imaging was performed. Quantitative gated imaging was also performed to evaluate left ventricular wall motion, and estimate left ventricular ejection fraction. COMPARISON:  10/27/2014 FINDINGS: Perfusion: Matching perfusion defect in the anteroseptal and anterior wall on stress and rest images, medium severity moderate-sized, extending towards the cardiac apex. There is up to 13% reduction in inferior wall activity on stress images compared to rest images. There is some high activity in the stomach which may be falsely elevating the inferior wall counts on the rest images, but in general the appearance is considered suspicious for inducible ischemia. Moderate size moderate severity. Wall Motion: Inferoseptal hypokinesis and poor wall thickening. Left Ventricular Ejection Fraction: 48 % End diastolic volume 92 ml End systolic volume 47 ml IMPRESSION: 1. Suspected moderate-sized region of inducible ischemia along the inferior wall. However, this might be exaggerated by the elevated counts in the inferior wall on rest images cause by activity in the stomach. There is also a moderate-sized moderate severity matched defect in the anteroseptal an anterior wall compatible with scar. 2. Hypokinesis and poor wall thickening in the inferoseptal wall. 3. Left ventricular ejection fraction 48% 4. Intermediate-risk stress test findings*. *2012 Appropriate Use Criteria for Coronary Revascularization Focused Update: J Am Coll Cardiol. N6492421.  http://content.[email or web address].com.aspx?articleid=[email or web address] Electronically Signed   By: Van Clines M.D.   On: 07/27/2015 11:45     Scheduled Meds: . carbamazepine  500 mg Oral BID  . clopidogrel  75 mg Oral Daily  . DULoxetine  60 mg Oral q morning - 10a  . enoxaparin (LOVENOX) injection  40 mg Subcutaneous Daily  . ethosuximide  250 mg Oral Q breakfast   And  . ethosuximide  500 mg Oral BID  . ezetimibe  10 mg Oral Daily  . levothyroxine  50 mcg Oral QAC breakfast  . metoprolol succinate  25 mg Oral Daily  . tiZANidine  4-8 mg Oral QHS   Continuous Infusions:  PRN Meds: acetaminophen, nitroGLYCERIN, ondansetron (ZOFRAN) IV  Time spent: 30 minutes  Author: Berle Mull, MD [facility] Pager: [phone] 07/27/2015 5:37 PM  If 7PM-7AM, please contact night-coverage at [email or web address], password [facility]

## 2015-07-27 NOTE — Progress Notes (Signed)
Completed 1 day treadmill stress test without significant complication. Pending final result by [facility] radiology.   Hilbert Corrigan PA Pager: [phone]

## 2015-07-27 NOTE — Progress Notes (Signed)
Pt complaining of chest pain and requested a nitro. Pt is sinus rhythm on tele and doesn't appear to be in distress. Nitro has been given. Will continue to monitor.   Grant Fontana RN, BSN

## 2015-07-27 NOTE — Progress Notes (Signed)
Patient Name: Rachel Clark Date of Encounter: 07/27/2015  Primary Cardiologist: Dr [name]   Principal Problem:   Unstable angina [facility]) Active Problems:   Coronary artery disease, post CABG 2007    Dyslipidemia   Hypothyroid   GERD (gastroesophageal reflux disease)   Colon cancer ([place])   Cardiac pacemaker in situ    SUBJECTIVE  Baseline 2-3/10 chest pain, did not increase with treadmill nuc. Could barely make it to target HR and maintain speed for 1 min. Also had hypertensive response with treadmill, SBP increased to 220 during stress  CURRENT MEDS . carbamazepine  500 mg Oral BID  . clopidogrel  75 mg Oral Daily  . DULoxetine  60 mg Oral q morning - 10a  . enoxaparin (LOVENOX) injection  40 mg Subcutaneous Daily  . ethosuximide  250 mg Oral Q breakfast   And  . ethosuximide  500 mg Oral BID  . ezetimibe  10 mg Oral Daily  . levothyroxine  50 mcg Oral QAC breakfast  . metoprolol succinate  25 mg Oral Daily  . regadenoson      . tiZANidine  4-8 mg Oral QHS    OBJECTIVE  Filed Vitals:   07/27/15 1025 07/27/15 1028 07/27/15 1031 07/27/15 1032  BP: 147/75 218/99 226/110 186/101  Pulse:      Temp:      TempSrc:      Resp:      Height:      Weight:      SpO2:       No intake or output data in the 24 hours ending 07/27/15 1036 Filed Weights   07/25/15 1740  Weight: 190 lb (86.183 kg)    PHYSICAL EXAM  General: Pleasant, NAD. Neuro: Alert and oriented X 3. Moves all extremities spontaneously. Psych: Normal affect. HEENT:  Normal  Neck: Supple without bruits or JVD. Lungs:  Resp regular and unlabored, CTA. Heart: RRR no s3, s4, or murmurs. Abdomen: Soft, non-tender, non-distended, BS + x 4.  Extremities: No clubbing, cyanosis or edema. DP/PT/Radials 2+ and equal bilaterally.  Accessory Clinical Findings  CBC  Recent Labs  07/25/15 1739 07/27/15 0745  WBC 7.0 7.3  NEUTROABS  --  3.9  HGB 14.0 15.0  HCT 41.4 44.2  MCV 88.3 87.5  PLT 288 277     Basic Metabolic Panel  Recent Labs  07/25/15 1739 07/27/15 0745  NA 139 137  K 3.5 4.1  CL 105 103  CO2 25 24  GLUCOSE 144* 98  BUN 10 10  CREATININE 0.62 0.63  CALCIUM 8.9 9.2   Cardiac Enzymes  Recent Labs  07/26/15 0110 07/26/15 0615 07/26/15 1245  TROPONINI <0.03 <0.03 <0.03       ECG  No new EKG  Echocardiogram 07/26/2015  LV EF: 60% -  65%  ------------------------------------------------------------------- Indications:   Chest pain 786.51.  ------------------------------------------------------------------- History:  PMH: Colon cancer. Coronary artery disease. Risk factors: Dyslipidemia.  ------------------------------------------------------------------- Study Conclusions  - Left ventricle: The cavity size was normal. There was mild concentric hypertrophy. Systolic function was normal. The estimated ejection fraction was in the range of 60% to 65%. Wall motion was normal; there were no regional wall motion abnormalities. Left ventricular diastolic function parameters were normal. - Mitral valve: There was mild regurgitation. - Atrial septum: No defect or patent foramen ovale was identified bu color flow Doppler. - Tricuspid valve: There was trivial regurgitation. - Inferior vena cava: The vessel was normal in size. The respirophasic diameter changes were in  the normal range (>= 50%), consistent with normal central venous pressure. - Global longitudinal strain -18.6%.     Radiology/Studies  Dg Chest 2 View  07/25/2015  CLINICAL DATA:  Chest pain.  Coronary artery disease. EXAM: CHEST  2 VIEW COMPARISON:  03/26/2015 FINDINGS: The heart size and mediastinal contours are within normal limits. Both lungs are clear. No evidence of pneumothorax or hemothorax. Dual lead transvenous pacemaker remains in appropriate position. Prior CABG again noted. IMPRESSION: No active cardiopulmonary disease. Electronically Signed   By:  Earle Gell M.D.   On: 07/25/2015 18:36    ASSESSMENT AND PLAN  1. Chest pain  - last cath 03/2015 2v dx with chronically occluded mid LAD, patent LIMA to LAD, occluded SVG to RCA with L to R collateral.  - Echo 07/26/2015 EF 60-65%, no RWMA, mild MR.   - pending treadmill nuc today, if there is ischemia in inferior distribution, will plan for PCI CTO of RCA  2. CAD s/p CABG 3. HLD intolerant to statin 4. H/o PPM  Signed, Almyra Deforest PA-C Pager: [phone] As above, patient seen and examined. Patient presently denies chest pain.Nuclear study shows ejection fraction 48%. Moderate ischemia in the inferior wall. There is also a moderate size defect in the anteroseptal wall concerning for scar. I have discussed the results with patient and husband. We will plan to proceed with cardiac catheterization tomorrow. The risks and benefits were discussed and she agrees to proceed. If she has not developed any new disease in the left system then she may be a candidate for PCI CTO of RCA. Continue present cardiac medications. Kirk Ruths

## 2015-07-28 ENCOUNTER — Encounter (HOSPITAL_COMMUNITY)
Admission: EM | Disposition: A | Discharge status: Home / Self Care | Payer: Medicare Other | Source: Home / Self Care | Attending: Internal Medicine

## 2015-07-28 DIAGNOSIS — E785 Hyperlipidemia, unspecified: Secondary | ICD-10-CM

## 2015-07-28 DIAGNOSIS — E039 Hypothyroidism, unspecified: Secondary | ICD-10-CM

## 2015-07-28 DIAGNOSIS — I200 Unstable angina: Secondary | ICD-10-CM | POA: Diagnosis not present

## 2015-07-28 DIAGNOSIS — I25110 Atherosclerotic heart disease of native coronary artery with unstable angina pectoris: Secondary | ICD-10-CM | POA: Diagnosis not present

## 2015-07-28 DIAGNOSIS — I2510 Atherosclerotic heart disease of native coronary artery without angina pectoris: Secondary | ICD-10-CM | POA: Diagnosis not present

## 2015-07-28 DIAGNOSIS — Z950 Presence of cardiac pacemaker: Secondary | ICD-10-CM | POA: Diagnosis not present

## 2015-07-28 HISTORY — PX: CARDIAC CATHETERIZATION: SHX172

## 2015-07-28 LAB — PROTIME-INR
INR: 1.19 (ref 0.00–1.49)
Prothrombin Time: 15.3 seconds — ABNORMAL HIGH (ref 11.6–15.2)

## 2015-07-28 SURGERY — LEFT HEART CATH AND CORONARY ANGIOGRAPHY
Anesthesia: LOCAL

## 2015-07-28 MED ORDER — RANOLAZINE ER 500 MG PO TB12
500.0000 mg | ORAL_TABLET | Freq: Two times a day (BID) | ORAL | Status: DC
  Administered 2015-07-28 (×2): 500 mg via ORAL
  Filled 2015-07-28 (×2): qty 1

## 2015-07-28 MED ORDER — SODIUM CHLORIDE 0.9 % IV SOLN: 250.0000 mL | INTRAVENOUS | Status: DC | PRN

## 2015-07-28 MED ORDER — ENOXAPARIN SODIUM 30 MG/0.3ML ~~LOC~~ SOLN: 30.0000 mg | SUBCUTANEOUS | Status: DC

## 2015-07-28 MED ORDER — SODIUM CHLORIDE 0.9 % IV SOLN
INTRAVENOUS | Status: DC
  Administered 2015-07-28: 150 mL/h via INTRAVENOUS
  Administered 2015-07-28: 1000 mL via INTRAVENOUS

## 2015-07-28 MED ORDER — NITROGLYCERIN 1 MG/10 ML FOR IR/CATH LAB
INTRA_ARTERIAL | Status: AC
  Filled 2015-07-28: qty 10

## 2015-07-28 MED ORDER — AMLODIPINE BESYLATE 5 MG PO TABS
5.0000 mg | ORAL_TABLET | Freq: Every day | ORAL | Status: DC
  Administered 2015-07-28 – 2015-07-29 (×2): 5 mg via ORAL
  Filled 2015-07-28 (×2): qty 1

## 2015-07-28 MED ORDER — MIDAZOLAM HCL 2 MG/2ML IJ SOLN
INTRAMUSCULAR | Status: DC | PRN
  Administered 2015-07-28: 2 mg via INTRAVENOUS

## 2015-07-28 MED ORDER — LIDOCAINE HCL (PF) 1 % IJ SOLN
INTRAMUSCULAR | Status: DC | PRN
  Administered 2015-07-28: 25 mL

## 2015-07-28 MED ORDER — FENTANYL CITRATE (PF) 100 MCG/2ML IJ SOLN
INTRAMUSCULAR | Status: DC | PRN
  Administered 2015-07-28: 50 ug via INTRAVENOUS

## 2015-07-28 MED ORDER — METOPROLOL SUCCINATE ER 50 MG PO TB24
50.0000 mg | ORAL_TABLET | Freq: Every day | ORAL | Status: DC
  Administered 2015-07-29: 50 mg via ORAL
  Filled 2015-07-28: qty 1

## 2015-07-28 MED ORDER — MIDAZOLAM HCL 2 MG/2ML IJ SOLN
INTRAMUSCULAR | Status: AC
  Filled 2015-07-28: qty 2

## 2015-07-28 MED ORDER — LIDOCAINE HCL (PF) 1 % IJ SOLN
INTRAMUSCULAR | Status: AC
  Filled 2015-07-28: qty 30

## 2015-07-28 MED ORDER — SODIUM CHLORIDE 0.9% FLUSH
3.0000 mL | Freq: Two times a day (BID) | INTRAVENOUS | Status: DC
  Administered 2015-07-28 – 2015-07-29 (×2): 3 mL via INTRAVENOUS

## 2015-07-28 MED ORDER — FENTANYL CITRATE (PF) 100 MCG/2ML IJ SOLN
INTRAMUSCULAR | Status: AC
  Filled 2015-07-28: qty 2

## 2015-07-28 MED ORDER — HEPARIN (PORCINE) IN NACL 2-0.9 UNIT/ML-% IJ SOLN
INTRAMUSCULAR | Status: AC
  Filled 2015-07-28: qty 1500

## 2015-07-28 MED ORDER — ONDANSETRON HCL 4 MG/2ML IJ SOLN: 4.0000 mg | Freq: Four times a day (QID) | INTRAMUSCULAR | Status: DC | PRN

## 2015-07-28 MED ORDER — SODIUM CHLORIDE 0.9% FLUSH: 3.0000 mL | INTRAVENOUS | Status: DC | PRN

## 2015-07-28 MED ORDER — ACETAMINOPHEN 325 MG PO TABS: 650.0000 mg | ORAL_TABLET | ORAL | Status: DC | PRN

## 2015-07-28 SURGICAL SUPPLY — 8 items
CATH INFINITI 5FR MULTPACK ANG (CATHETERS) ×1 IMPLANT
DEVICE RAD COMP TR BAND LRG (VASCULAR PRODUCTS) ×2 IMPLANT
KIT HEART LEFT (KITS) ×2 IMPLANT
PACK CARDIAC CATHETERIZATION (CUSTOM PROCEDURE TRAY) ×2 IMPLANT
SHEATH PINNACLE 5F 10CM (SHEATH) ×1 IMPLANT
SYR MEDRAD MARK V 150ML (SYRINGE) ×2 IMPLANT
TRANSDUCER W/STOPCOCK (MISCELLANEOUS) ×2 IMPLANT
WIRE EMERALD 3MM-J .035X150CM (WIRE) ×1 IMPLANT

## 2015-07-28 NOTE — Interval H&P Note (Signed)
Cath Lab Visit (complete for each Cath Lab visit)  Clinical Evaluation Leading to the Procedure:   ACS: No.  Non-ACS:    Anginal Classification: CCS III  Anti-ischemic medical therapy: Maximal Therapy (2 or more classes of medications)  Non-Invasive Test Results: Intermediate-risk stress test findings: cardiac mortality 1-3%/year  Prior CABG: Previous CABG      History and Physical Interval Note:  07/28/2015 12:21 PM  Rachel Clark  has presented today for surgery, with the diagnosis of cp  The various methods of treatment have been discussed with the patient and family. After consideration of risks, benefits and other options for treatment, the patient has consented to  Procedure(s): Left Heart Cath and Coronary Angiography (N/A) as a surgical intervention .  The patient's history has been reviewed, patient examined, no change in status, stable for surgery.  I have reviewed the patient's chart and labs.  Questions were answered to the patient's satisfaction.     Rachel Clark A

## 2015-07-28 NOTE — Progress Notes (Signed)
Site area: RFA Site Prior to Removal:  Level 0 Pressure Applied For:67min Manual:  yes  Patient Status During Pull:  stable Post Pull Site:  Level Post Pull Instructions Given:  yes Post Pull Pulses Present: palpable Dressing Applied:  clear Bedrest begins @ 1400 till 1800 Comments:

## 2015-07-28 NOTE — Progress Notes (Signed)
[facility]                                                                              Patient Demographics  Rachel Clark, is a 65 y.o. female, DOB - June 17, 1951, [id number]  Admit date - 07/25/2015   Admitting Physician Gennaro Africa, MD  Outpatient Primary MD for the patient is Vidal Schwalbe, MD  LOS - 1   Chief Complaint  Patient presents with  . Chest Pain      HPI on 07/25/2015 by Dr. Gennaro Africa Patient is a 65 yo female with history of CAD s/p CABG and stent with LHC in 2016 showing severe 2 vessel obstructive disease managed medically, AVB s/p PPM in 2016 and epilepsy. Patient came with cc of substernal chest pain that is similar to her previous anginal chest pain that started at 10 am this morning and then at 4 pm , relieved both times with SL NG. No dyspnea/cough/N/V/D/C/abd pain/Fever/chills. Pain is not positional and non radiating.   Assessment & Plan   Unstable  Angina -Cardiology consulted and appreciated  -Stress test was concerning for ischemia -Cardiac catheterization today  Hypothyroidism -Continue Synthroid  Depression/mood disorder -Continue Cymbalta  Seizure disorder -Continue Tegretol and Zarontin  Dyslipidemia -Continue Zetia  Code Status: Full   Family Communication: None at bedside  Disposition Plan: Admitted, pending cath today  Time Spent in minutes   30 minutes  Procedures  Stress test Cardiac catheterization  Consults   Cardiology  DVT Prophylaxis  Lovenox  Lab Results  Component Value Date   PLT 277 07/27/2015    Medications  Scheduled Meds: . [MAR Hold] carbamazepine  500 mg Oral BID  . [MAR Hold] clopidogrel  75 mg Oral Daily  . [MAR Hold] DULoxetine  60 mg Oral q morning - 10a  . [MAR Hold] enoxaparin (LOVENOX) injection  40 mg Subcutaneous Daily  . [MAR Hold] ethosuximide  250 mg Oral Q breakfast   And  . [MAR Hold] ethosuximide  500 mg Oral BID  . [MAR Hold] ezetimibe  10 mg Oral Daily  . [MAR  Hold] levothyroxine  50 mcg Oral QAC breakfast  . [MAR Hold] metoprolol succinate  25 mg Oral Daily  . sodium chloride flush  3 mL Intravenous Q12H  . sodium chloride flush  3 mL Intravenous Q12H  . [MAR Hold] tiZANidine  4-8 mg Oral QHS   Continuous Infusions: . sodium chloride 1,000 mL (07/28/15 1333)  . sodium chloride 1 mL/kg/hr (07/28/15 0730)   PRN Meds:.sodium chloride, sodium chloride, [MAR Hold] acetaminophen, [MAR Hold] nitroGLYCERIN, [MAR Hold] ondansetron (ZOFRAN) IV, sodium chloride flush, sodium chloride flush  Antibiotics    Anti-infectives    None      Subjective:   Rachel Clark seen and examined today.  patient denies any current chest pain or shortness of breath, abdominal pain, nausea or vomiting. Patient is ready to have her catheterization.   Objective:   Filed Vitals:   07/28/15 1259 07/28/15 1304 07/28/15 1309 07/28/15 1314  BP: 153/78 143/73    Pulse: 62 60 0 0  Temp:      TempSrc:      Resp: 24 8 0 45  Height:      Weight:      SpO2: 98% 99% 0% 0%    Wt Readings from Last 3 Encounters:  07/25/15 86.183 kg (190 lb)  04/05/15 86.456 kg (190 lb 9.6 oz)  03/26/15 88.9 kg (195 lb 15.8 oz)     Intake/Output Summary (Last 24 hours) at 07/28/15 1408 Last data filed at 07/28/15 0830  Gross per 24 hour  Intake    240 ml  Output      0 ml  Net    240 ml    Exam  General: Well developed, well nourished, NAD, appears stated age  HEENT: NCAT, mucous membranes moist.   Cardiovascular: S1 S2 auscultated, no rubs, murmurs or gallops. Regular rate and rhythm.  Respiratory: Clear to auscultation bilaterally with equal chest rise  Abdomen: Soft, nontender, nondistended, + bowel sounds  Extremities: warm dry without cyanosis clubbing or edema  Neuro: AAOx3, nonfocal  Psych: Normal affect and demeanor with intact judgement and insight  Data Review   Micro Results No results found for this or any previous visit (from the past 240  hour(s)).  Radiology Reports Dg Chest 2 View  07/25/2015  CLINICAL DATA:  Chest pain.  Coronary artery disease. EXAM: CHEST  2 VIEW COMPARISON:  03/26/2015 FINDINGS: The heart size and mediastinal contours are within normal limits. Both lungs are clear. No evidence of pneumothorax or hemothorax. Dual lead transvenous pacemaker remains in appropriate position. Prior CABG again noted. IMPRESSION: No active cardiopulmonary disease. Electronically Signed   By: Earle Gell M.D.   On: 07/25/2015 18:36   Nm Myocar Multi W/spect W/wall Motion / Ef  07/27/2015  CLINICAL DATA:  Chest pain. Myocardial infarction. Prior cardiac catheterization. Coronary artery disease. EXAM: MYOCARDIAL IMAGING WITH SPECT (REST AND EXERCISE) GATED LEFT VENTRICULAR WALL MOTION STUDY LEFT VENTRICULAR EJECTION FRACTION TECHNIQUE: Standard myocardial SPECT imaging was performed after resting intravenous injection of 10 mCi Tc-8m sestamibi. Subsequently, exercise tolerance test was performed by the patient under the supervision of the Cardiology staff. At peak-stress, 30 mCi Tc-48m sestamibi was injected intravenously and standard myocardial SPECT imaging was performed. Quantitative gated imaging was also performed to evaluate left ventricular wall motion, and estimate left ventricular ejection fraction. COMPARISON:  10/27/2014 FINDINGS: Perfusion: Matching perfusion defect in the anteroseptal and anterior wall on stress and rest images, medium severity moderate-sized, extending towards the cardiac apex. There is up to 13% reduction in inferior wall activity on stress images compared to rest images. There is some high activity in the stomach which may be falsely elevating the inferior wall counts on the rest images, but in general the appearance is considered suspicious for inducible ischemia. Moderate size moderate severity. Wall Motion: Inferoseptal hypokinesis and poor wall thickening. Left Ventricular Ejection Fraction: 48 % End diastolic  volume 92 ml End systolic volume 47 ml IMPRESSION: 1. Suspected moderate-sized region of inducible ischemia along the inferior wall. However, this might be exaggerated by the elevated counts in the inferior wall on rest images cause by activity in the stomach. There is also a moderate-sized moderate severity matched defect in the anteroseptal an anterior wall compatible with scar. 2. Hypokinesis and poor wall thickening in the inferoseptal wall. 3. Left ventricular ejection fraction 48% 4. Intermediate-risk stress test findings*. *2012 Appropriate Use Criteria for Coronary Revascularization Focused Update: J Am Coll Cardiol. [id number]. [email or web address]://[email or web address]?articleid=[email or web address] Electronically Signed   By: Van Clines M.D.   On: 07/27/2015 11:45    CBC  Recent Labs Lab 07/25/15 1739 07/27/15  0745  WBC 7.0 7.3  HGB 14.0 15.0  HCT 41.4 44.2  PLT 288 277  MCV 88.3 87.5  MCH 29.9 29.7  MCHC 33.8 33.9  RDW 13.2 12.9  LYMPHSABS  --  2.6  MONOABS  --  0.5  EOSABS  --  0.2  BASOSABS  --  0.0    Chemistries   Recent Labs Lab 07/25/15 1739 07/27/15 0745  NA 139 137  K 3.5 4.1  CL 105 103  CO2 25 24  GLUCOSE 144* 98  BUN 10 10  CREATININE 0.62 0.63  CALCIUM 8.9 9.2   ------------------------------------------------------------------------------------------------------------------ estimated creatinine clearance is 81 mL/min (by C-G formula based on Cr of 0.63). ------------------------------------------------------------------------------------------------------------------ No results for input(s): HGBA1C in the last 72 hours. ------------------------------------------------------------------------------------------------------------------ No results for input(s): CHOL, HDL, LDLCALC, TRIG, CHOLHDL, LDLDIRECT in the last 72 hours. ------------------------------------------------------------------------------------------------------------------ No  results for input(s): TSH, T4TOTAL, T3FREE, THYROIDAB in the last 72 hours.  Invalid input(s): FREET3 ------------------------------------------------------------------------------------------------------------------ No results for input(s): VITAMINB12, FOLATE, FERRITIN, TIBC, IRON, RETICCTPCT in the last 72 hours.  Coagulation profile  Recent Labs Lab 07/28/15 0238  INR 1.19    No results for input(s): DDIMER in the last 72 hours.  Cardiac Enzymes  Recent Labs Lab 07/26/15 0110 07/26/15 0615 07/26/15 1245  TROPONINI <0.03 <0.03 <0.03   ------------------------------------------------------------------------------------------------------------------ Invalid input(s): POCBNP    Asiah Befort D.O. on 07/28/2015 at 2:08 PM  Between 7am to 7pm - Pager - [phone]  After 7pm go to [email or web address] - password [email or web address]  And look for the night coverage person covering for me after hours  [facility] Office  [phone]

## 2015-07-28 NOTE — H&P (View-Only) (Signed)
Patient Name: Rachel Clark Date of Encounter: 07/27/2015  Primary Cardiologist: Dr [name]   Principal Problem:   Unstable angina [facility]) Active Problems:   Coronary artery disease, post CABG 2007    Dyslipidemia   Hypothyroid   GERD (gastroesophageal reflux disease)   Colon cancer ([place])   Cardiac pacemaker in situ    SUBJECTIVE  Baseline 2-3/10 chest pain, did not increase with treadmill nuc. Could barely make it to target HR and maintain speed for 1 min. Also had hypertensive response with treadmill, SBP increased to 220 during stress  CURRENT MEDS . carbamazepine  500 mg Oral BID  . clopidogrel  75 mg Oral Daily  . DULoxetine  60 mg Oral q morning - 10a  . enoxaparin (LOVENOX) injection  40 mg Subcutaneous Daily  . ethosuximide  250 mg Oral Q breakfast   And  . ethosuximide  500 mg Oral BID  . ezetimibe  10 mg Oral Daily  . levothyroxine  50 mcg Oral QAC breakfast  . metoprolol succinate  25 mg Oral Daily  . regadenoson      . tiZANidine  4-8 mg Oral QHS    OBJECTIVE  Filed Vitals:   07/27/15 1025 07/27/15 1028 07/27/15 1031 07/27/15 1032  BP: 147/75 218/99 226/110 186/101  Pulse:      Temp:      TempSrc:      Resp:      Height:      Weight:      SpO2:       No intake or output data in the 24 hours ending 07/27/15 1036 Filed Weights   07/25/15 1740  Weight: 190 lb (86.183 kg)    PHYSICAL EXAM  General: Pleasant, NAD. Neuro: Alert and oriented X 3. Moves all extremities spontaneously. Psych: Normal affect. HEENT:  Normal  Neck: Supple without bruits or JVD. Lungs:  Resp regular and unlabored, CTA. Heart: RRR no s3, s4, or murmurs. Abdomen: Soft, non-tender, non-distended, BS + x 4.  Extremities: No clubbing, cyanosis or edema. DP/PT/Radials 2+ and equal bilaterally.  Accessory Clinical Findings  CBC  Recent Labs  07/25/15 1739 07/27/15 0745  WBC 7.0 7.3  NEUTROABS  --  3.9  HGB 14.0 15.0  HCT 41.4 44.2  MCV 88.3 87.5  PLT 288 277     Basic Metabolic Panel  Recent Labs  07/25/15 1739 07/27/15 0745  NA 139 137  K 3.5 4.1  CL 105 103  CO2 25 24  GLUCOSE 144* 98  BUN 10 10  CREATININE 0.62 0.63  CALCIUM 8.9 9.2   Cardiac Enzymes  Recent Labs  07/26/15 0110 07/26/15 0615 07/26/15 1245  TROPONINI <0.03 <0.03 <0.03       ECG  No new EKG  Echocardiogram 07/26/2015  LV EF: 60% -  65%  ------------------------------------------------------------------- Indications:   Chest pain 786.51.  ------------------------------------------------------------------- History:  PMH: Colon cancer. Coronary artery disease. Risk factors: Dyslipidemia.  ------------------------------------------------------------------- Study Conclusions  - Left ventricle: The cavity size was normal. There was mild concentric hypertrophy. Systolic function was normal. The estimated ejection fraction was in the range of 60% to 65%. Wall motion was normal; there were no regional wall motion abnormalities. Left ventricular diastolic function parameters were normal. - Mitral valve: There was mild regurgitation. - Atrial septum: No defect or patent foramen ovale was identified bu color flow Doppler. - Tricuspid valve: There was trivial regurgitation. - Inferior vena cava: The vessel was normal in size. The respirophasic diameter changes were in  the normal range (>= 50%), consistent with normal central venous pressure. - Global longitudinal strain -18.6%.     Radiology/Studies  Dg Chest 2 View  07/25/2015  CLINICAL DATA:  Chest pain.  Coronary artery disease. EXAM: CHEST  2 VIEW COMPARISON:  03/26/2015 FINDINGS: The heart size and mediastinal contours are within normal limits. Both lungs are clear. No evidence of pneumothorax or hemothorax. Dual lead transvenous pacemaker remains in appropriate position. Prior CABG again noted. IMPRESSION: No active cardiopulmonary disease. Electronically Signed   By:  Earle Gell M.D.   On: 07/25/2015 18:36    ASSESSMENT AND PLAN  1. Chest pain  - last cath 03/2015 2v dx with chronically occluded mid LAD, patent LIMA to LAD, occluded SVG to RCA with L to R collateral.  - Echo 07/26/2015 EF 60-65%, no RWMA, mild MR.   - pending treadmill nuc today, if there is ischemia in inferior distribution, will plan for PCI CTO of RCA  2. CAD s/p CABG 3. HLD intolerant to statin 4. H/o PPM  Signed, Almyra Deforest PA-C Pager: [phone] As above, patient seen and examined. Patient presently denies chest pain.Nuclear study shows ejection fraction 48%. Moderate ischemia in the inferior wall. There is also a moderate size defect in the anteroseptal wall concerning for scar. I have discussed the results with patient and husband. We will plan to proceed with cardiac catheterization tomorrow. The risks and benefits were discussed and she agrees to proceed. If she has not developed any new disease in the left system then she may be a candidate for PCI CTO of RCA. Continue present cardiac medications. Kirk Ruths

## 2015-07-29 ENCOUNTER — Encounter (HOSPITAL_COMMUNITY): Payer: Self-pay | Admitting: Cardiovascular Disease

## 2015-07-29 DIAGNOSIS — Z950 Presence of cardiac pacemaker: Secondary | ICD-10-CM | POA: Diagnosis not present

## 2015-07-29 DIAGNOSIS — I2510 Atherosclerotic heart disease of native coronary artery without angina pectoris: Secondary | ICD-10-CM

## 2015-07-29 DIAGNOSIS — I200 Unstable angina: Secondary | ICD-10-CM | POA: Diagnosis not present

## 2015-07-29 DIAGNOSIS — I25110 Atherosclerotic heart disease of native coronary artery with unstable angina pectoris: Secondary | ICD-10-CM | POA: Diagnosis not present

## 2015-07-29 LAB — CBC
HEMATOCRIT: 37.4 % (ref 36.0–46.0)
HEMOGLOBIN: 12.1 g/dL (ref 12.0–15.0)
MCH: 28.5 pg (ref 26.0–34.0)
MCHC: 32.4 g/dL (ref 30.0–36.0)
MCV: 88 fL (ref 78.0–100.0)
Platelets: 265 10*3/uL (ref 150–400)
RBC: 4.25 MIL/uL (ref 3.87–5.11)
RDW: 13 % (ref 11.5–15.5)
WBC: 7.3 10*3/uL (ref 4.0–10.5)

## 2015-07-29 LAB — BASIC METABOLIC PANEL
Anion gap: 6 (ref 5–15)
BUN: 7 mg/dL (ref 6–20)
CHLORIDE: 108 mmol/L (ref 101–111)
CO2: 25 mmol/L (ref 22–32)
CREATININE: 0.56 mg/dL (ref 0.44–1.00)
Calcium: 8.4 mg/dL — ABNORMAL LOW (ref 8.9–10.3)
GFR calc Af Amer: >60 mL/min (ref >60)
GFR calc non Af Amer: >60 mL/min (ref >60)
Glucose, Bld: 97 mg/dL (ref 65–99)
Potassium: 4.2 mmol/L (ref 3.5–5.1)
SODIUM: 139 mmol/L (ref 135–145)

## 2015-07-29 MED ORDER — METOPROLOL SUCCINATE ER 50 MG PO TB24: 50.0000 mg | ORAL_TABLET | Freq: Every day | ORAL | Status: DC

## 2015-07-29 MED ORDER — AMLODIPINE BESYLATE 5 MG PO TABS: 5.0000 mg | ORAL_TABLET | Freq: Every day | ORAL | Status: DC

## 2015-07-29 MED FILL — Heparin Sodium (Porcine) 2 Unit/ML in Sodium Chloride 0.9%: INTRAMUSCULAR | Qty: 1500 | Status: AC

## 2015-07-29 MED FILL — Nitroglycerin IV Soln 100 MCG/ML in D5W: INTRA_ARTERIAL | Qty: 10 | Status: AC

## 2015-07-29 NOTE — Discharge Instructions (Signed)
Nonspecific Chest Pain  °Chest pain can be caused by many different conditions. There is always a chance that your pain could be related to something serious, such as a heart attack or a blood clot in your lungs. Chest pain can also be caused by conditions that are not life-threatening. If you have chest pain, it is very important to follow up with your health care provider. °CAUSES  °Chest pain can be caused by: °· Heartburn. °· Pneumonia or bronchitis. °· Anxiety or stress. °· Inflammation around your heart (pericarditis) or lung (pleuritis or pleurisy). °· A blood clot in your lung. °· A collapsed lung (pneumothorax). It can develop suddenly on its own (spontaneous pneumothorax) or from trauma to the chest. °· Shingles infection (varicella-zoster virus). °· Heart attack. °· Damage to the bones, muscles, and cartilage that make up your chest wall. This can include: °¨ Bruised bones due to injury. °¨ Strained muscles or cartilage due to frequent or repeated coughing or overwork. °¨ Fracture to one or more ribs. °¨ Sore cartilage due to inflammation (costochondritis). °RISK FACTORS  °Risk factors for chest pain may include: °· Activities that increase your risk for trauma or injury to your chest. °· Respiratory infections or conditions that cause frequent coughing. °· Medical conditions or overeating that can cause heartburn. °· Heart disease or family history of heart disease. °· Conditions or health behaviors that increase your risk of developing a blood clot. °· Having had chicken pox (varicella zoster). °SIGNS AND SYMPTOMS °Chest pain can feel like: °· Burning or tingling on the surface of your chest or deep in your chest. °· Crushing, pressure, aching, or squeezing pain. °· Dull or sharp pain that is worse when you move, cough, or take a deep breath. °· Pain that is also felt in your back, neck, shoulder, or arm, or pain that spreads to any of these areas. °Your chest pain may come and go, or it may stay  constant. °DIAGNOSIS °Lab tests or other studies may be needed to find the cause of your pain. Your health care provider may have you take a test called an ambulatory ECG (electrocardiogram). An ECG records your heartbeat patterns at the time the test is performed. You may also have other tests, such as: °· Transthoracic echocardiogram (TTE). During echocardiography, sound waves are used to create a picture of all of the heart structures and to look at how blood flows through your heart. °· Transesophageal echocardiogram (TEE). This is a more advanced imaging test that obtains images from inside your body. It allows your health care provider to see your heart in finer detail. °· Cardiac monitoring. This allows your health care provider to monitor your heart rate and rhythm in real time. °· Holter monitor. This is a portable device that records your heartbeat and can help to diagnose abnormal heartbeats. It allows your health care provider to track your heart activity for several days, if needed. °· Stress tests. These can be done through exercise or by taking medicine that makes your heart beat more quickly. °· Blood tests. °· Imaging tests. °TREATMENT  °Your treatment depends on what is causing your chest pain. Treatment may include: °· Medicines. These may include: °¨ Acid blockers for heartburn. °¨ Anti-inflammatory medicine. °¨ Pain medicine for inflammatory conditions. °¨ Antibiotic medicine, if an infection is present. °¨ Medicines to dissolve blood clots. °¨ Medicines to treat coronary artery disease. °· Supportive care for conditions that do not require medicines. This may include: °¨ Resting. °¨ Applying heat   or cold packs to injured areas. °¨ Limiting activities until pain decreases. °HOME CARE INSTRUCTIONS °· If you were prescribed an antibiotic medicine, finish it all even if you start to feel better. °· Avoid any activities that bring on chest pain. °· Do not use any tobacco products, including  cigarettes, chewing tobacco, or electronic cigarettes. If you need help quitting, ask your health care provider. °· Do not drink alcohol. °· Take medicines only as directed by your health care provider. °· Keep all follow-up visits as directed by your health care provider. This is important. This includes any further testing if your chest pain does not go away. °· If heartburn is the cause for your chest pain, you may be told to keep your head raised (elevated) while sleeping. This reduces the chance that acid will go from your stomach into your esophagus. °· Make lifestyle changes as directed by your health care provider. These may include: °¨ Getting regular exercise. Ask your health care provider to suggest some activities that are safe for you. °¨ Eating a heart-healthy diet. A registered dietitian can help you to learn healthy eating options. °¨ Maintaining a healthy weight. °¨ Managing diabetes, if necessary. °¨ Reducing stress. °SEEK MEDICAL CARE IF: °· Your chest pain does not go away after treatment. °· You have a rash with blisters on your chest. °· You have a fever. °SEEK IMMEDIATE MEDICAL CARE IF:  °· Your chest pain is worse. °· You have an increasing cough, or you cough up blood. °· You have severe abdominal pain. °· You have severe weakness. °· You faint. °· You have chills. °· You have sudden, unexplained chest discomfort. °· You have sudden, unexplained discomfort in your arms, back, neck, or jaw. °· You have shortness of breath at any time. °· You suddenly start to sweat, or your skin gets clammy. °· You feel nauseous or you vomit. °· You suddenly feel light-headed or dizzy. °· Your heart begins to beat quickly, or it feels like it is skipping beats. °These symptoms may represent a serious problem that is an emergency. Do not wait to see if the symptoms will go away. Get medical help right away. Call your local emergency services (911 in the [place] Do not drive yourself to the hospital. °  °This  information is not intended to replace advice given to you by your health care provider. Make sure you discuss any questions you have with your health care provider. °  °Document Released: 03/29/2005 Document Revised: 07/10/2014 Document Reviewed: 01/23/2014 °Elsevier Interactive Patient Education ©2016 [employer] ° °

## 2015-07-29 NOTE — Discharge Summary (Signed)
Physician Discharge Summary  Rachel Clark [id number] DOB: 1950-09-26 DOA: 07/25/2015  PCP: Vidal Schwalbe, MD  Admit date: 07/25/2015 Discharge date: 07/29/2015  Time spent: 45 minutes  Recommendations for Outpatient Follow-up:  Patient will be discharged to home.  Patient will need to follow up with primary care provider within one week of discharge.  Follow up with cardiology, Dr. [name] in 2-4 weeks. Follow up with neurologist in 1-2 weeks.  Patient should continue medications as prescribed.  Patient should follow a heart healthy diet.  Resume activity as tolerated.   Discharge Diagnoses:  Chest pain/unstable angina Hypothyroidism Depression/mood disorder Seizure disorder Dyslipidemia  Discharge Condition: Stable  Diet recommendation: Heart healthy  Filed Weights   07/25/15 1740  Weight: 86.183 kg (190 lb)    History of present illness:  on 07/25/2015 by Dr. Gennaro Clark Patient is a 65 yo female with history of CAD s/p CABG and stent with LHC in 2016 showing severe 2 vessel obstructive disease managed medically, AVB s/p PPM in 2016 and epilepsy. Patient came with cc of substernal chest pain that is similar to her previous anginal chest pain that started at 10 am this morning and then at 4 pm , relieved both times with SL NG. No dyspnea/cough/N/V/D/C/abd pain/Fever/chills. Pain is not positional and non radiating.   Hospital Course:  Chest pain/UnstableAngina -Cardiology consulted and appreciated  -Stress test was concerning for ischemia -Cardiac catheterization: Ost RCA to Prox RCA 90% stenosed, Prox RCA 99%, mid RCA 95%, Dist RCA 80%, Mid graft lesion 100% -Recommendations for medical therapy (plavix, norvasc, toprol) and follow up with cardiology in 2-4 weeks -Interrogated PPM: Normal function, no events noted  Hypothyroidism -Continue Synthroid  Depression/mood disorder -Continue Cymbalta  Seizure disorder -Continue Tegretol and  Zarontin  Dyslipidemia -Continue Zetia  Procedures  Stress test Cardiac catheterization  Consults  Cardiology  Discharge Exam: Filed Vitals:   07/29/15 0349 07/29/15 1053  BP: 121/57 135/65  Pulse: 60 64  Temp: 98 F (36.7 C)   Resp: 18    Exam  General: Well developed, well nourished, NAD  HEENT: NCAT, mucous membranes moist.   Cardiovascular: S1 S2 auscultated, RRR  Respiratory: Clear to auscultation bilaterally with equal chest rise  Abdomen: Soft, nontender, nondistended, + bowel sounds  Extremities: warm dry without cyanosis clubbing or edema  Neuro: AAOx3, nonfocal  Psych: Appropriate mood and affect  Discharge Instructions      Discharge Instructions    Discharge instructions    Complete by:  As directed   Patient will be discharged to home.  Patient will need to follow up with primary care provider within one week of discharge.  Follow up with cardiology, Dr. [name] in 2-4 weeks. Follow up with neurologist in 1-2 weeks.  Patient should continue medications as prescribed.  Patient should follow a heart healthy diet.  Resume activity as tolerated.            Medication List    TAKE these medications        acetaminophen 500 MG tablet  Commonly known as:  TYLENOL  Take 500-1,000 mg by mouth daily as needed for mild pain or headache.     amLODipine 5 MG tablet  Commonly known as:  NORVASC  Take 1 tablet (5 mg total) by mouth daily.     Azelastine HCl 0.15 % Soln  Place 1 spray into the nose as needed (allergies).     carbamazepine 100 MG 12 hr tablet  Commonly known as:  TEGRETOL  XR  TAKE FIVE TABLETS BY MOUTH TWICE DAILY     clopidogrel 75 MG tablet  Commonly known as:  PLAVIX  TAKE 1 TABLET (75 MG TOTAL) BY MOUTH DAILY. START ON 03/29/15     DULoxetine 60 MG capsule  Commonly known as:  CYMBALTA  Take 60 mg by mouth every morning.     ethosuximide 250 MG capsule  Commonly known as:  ZARONTIN  Take 1 capsule (250mg ) in the  morning, 2 capsules (500mg ) in the afternoon, and 2 capsules (500mg ) at night     ezetimibe 10 MG tablet  Commonly known as:  ZETIA  Take 1 tablet (10 mg total) by mouth daily.     levothyroxine 50 MCG tablet  Commonly known as:  SYNTHROID, LEVOTHROID  Take 50 mcg by mouth daily.     metoprolol succinate 50 MG 24 hr tablet  Commonly known as:  TOPROL-XL  Take 1 tablet (50 mg total) by mouth daily. Take with or immediately following a meal.     MINIVELLE 0.05 MG/24HR patch  Generic drug:  estradiol  Apply 1 patch topically 2 (two) times a week. Monday and Thursday     nitroGLYCERIN 0.4 MG SL tablet  Commonly known as:  NITROSTAT  Place 1 tablet (0.4 mg total) under the tongue every 5 (five) minutes as needed. For chest pain.     tiZANidine 2 MG tablet  Commonly known as:  ZANAFLEX  Take 2-4 tablets by mouth at bedtime.       Allergies  Allergen Reactions  . Aspirin Other (See Comments)    GI upset  . Erythromycin Nausea Only    Gi upset  . Imdur [Isosorbide Mononitrate]     Pt does not recall reaction  . Lisinopril     cough  . Niacin And Related     Pt does not recall reactions   . Penicillins Nausea Only    Has patient had a PCN reaction causing immediate rash, facial/tongue/throat swelling, SOB or lightheadedness with hypotension: Yes Has patient had a PCN reaction causing severe rash involving mucus membranes or skin necrosis: No Has patient had a PCN reaction that required hospitalization No Has patient had a PCN reaction occurring within the last 10 years: No If all of the above answers are "NO", then may proceed with Cephalosporin use.   . Statins     Muscle pain  . Sulfa Drugs Cross Reactors Swelling  . Tetracyclines & Related Nausea Only   Follow-up Information    Follow up with Vidal Schwalbe, MD. Schedule an appointment as soon as possible for a visit in 1 week.   Specialty:  Family Medicine   Why:  Hospital follow up   Contact information:   [address] [place] [address] [phone]       Follow up with Peter [name], MD. Schedule an appointment as soon as possible for a visit in 2 weeks.   Specialty:  Cardiology   Why:  Hospital follow up, chest pain   Contact information:   [address] [place] [address] [phone]        The results of significant diagnostics from this hospitalization (including imaging, microbiology, ancillary and laboratory) are listed below for reference.    Significant Diagnostic Studies: Dg Chest 2 View  07/25/2015  CLINICAL DATA:  Chest pain.  Coronary artery disease. EXAM: CHEST  2 VIEW COMPARISON:  03/26/2015 FINDINGS: The heart size and mediastinal contours are within  normal limits. Both lungs are clear. No evidence of pneumothorax or hemothorax. Dual lead transvenous pacemaker remains in appropriate position. Prior CABG again noted. IMPRESSION: No active cardiopulmonary disease. Electronically Signed   By: Earle Gell M.D.   On: 07/25/2015 18:36   Nm Myocar Multi W/spect W/wall Motion / Ef  07/27/2015  CLINICAL DATA:  Chest pain. Myocardial infarction. Prior cardiac catheterization. Coronary artery disease. EXAM: MYOCARDIAL IMAGING WITH SPECT (REST AND EXERCISE) GATED LEFT VENTRICULAR WALL MOTION STUDY LEFT VENTRICULAR EJECTION FRACTION TECHNIQUE: Standard myocardial SPECT imaging was performed after resting intravenous injection of 10 mCi Tc-29m sestamibi. Subsequently, exercise tolerance test was performed by the patient under the supervision of the Cardiology staff. At peak-stress, 30 mCi Tc-42m sestamibi was injected intravenously and standard myocardial SPECT imaging was performed. Quantitative gated imaging was also performed to evaluate left ventricular wall motion, and estimate left ventricular ejection fraction. COMPARISON:  10/27/2014 FINDINGS: Perfusion: Matching perfusion defect in the anteroseptal and anterior wall on stress and rest images, medium  severity moderate-sized, extending towards the cardiac apex. There is up to 13% reduction in inferior wall activity on stress images compared to rest images. There is some high activity in the stomach which may be falsely elevating the inferior wall counts on the rest images, but in general the appearance is considered suspicious for inducible ischemia. Moderate size moderate severity. Wall Motion: Inferoseptal hypokinesis and poor wall thickening. Left Ventricular Ejection Fraction: 48 % End diastolic volume 92 ml End systolic volume 47 ml IMPRESSION: 1. Suspected moderate-sized region of inducible ischemia along the inferior wall. However, this might be exaggerated by the elevated counts in the inferior wall on rest images cause by activity in the stomach. There is also a moderate-sized moderate severity matched defect in the anteroseptal an anterior wall compatible with scar. 2. Hypokinesis and poor wall thickening in the inferoseptal wall. 3. Left ventricular ejection fraction 48% 4. Intermediate-risk stress test findings*. *2012 Appropriate Use Criteria for Coronary Revascularization Focused Update: J Am Coll Cardiol. [id number]. http://[email or web address]?articleid=[email or web address] Electronically Signed   By: Van Clines M.D.   On: 07/27/2015 11:45    Microbiology: No results found for this or any previous visit (from the past 240 hour(s)).   Labs: Basic Metabolic Panel:  Recent Labs Lab 07/25/15 1739 07/27/15 0745 07/29/15 0249  NA 139 137 139  K 3.5 4.1 4.2  CL 105 103 108  CO2 25 24 25   GLUCOSE 144* 98 97  BUN 10 10 7   CREATININE 0.62 0.63 0.56  CALCIUM 8.9 9.2 8.4*   Liver Function Tests: No results for input(s): AST, ALT, ALKPHOS, BILITOT, PROT, ALBUMIN in the last 168 hours. No results for input(s): LIPASE, AMYLASE in the last 168 hours. No results for input(s): AMMONIA in the last 168 hours. CBC:  Recent Labs Lab 07/25/15 1739 07/27/15 0745  07/29/15 0249  WBC 7.0 7.3 7.3  NEUTROABS  --  3.9  --   HGB 14.0 15.0 12.1  HCT 41.4 44.2 37.4  MCV 88.3 87.5 88.0  PLT 288 277 265   Cardiac Enzymes:  Recent Labs Lab 07/26/15 0110 07/26/15 0615 07/26/15 1245  TROPONINI <0.03 <0.03 <0.03   BNP: BNP (last 3 results) No results for input(s): BNP in the last 8760 hours.  ProBNP (last 3 results) No results for input(s): PROBNP in the last 8760 hours.  CBG:  Recent Labs Lab 07/27/15 1130  GLUCAP 140*       Signed:  Cristal Ford  [facility] 07/29/2015, 4:38 PM

## 2015-07-29 NOTE — Progress Notes (Signed)
Patient Name: Rachel Clark Date of Encounter: 07/29/2015  Primary Cardiologist: Dr [name]   Principal Problem:   Unstable angina [facility]) Active Problems:   Coronary artery disease, post CABG 2007    Dyslipidemia   Hypothyroid   GERD (gastroesophageal reflux disease)   Colon cancer (HCC)   Cardiac pacemaker in situ   CAD, multiple vessel   Chest pain    SUBJECTIVE  Brief (seconds) of chest pain; no dyspnea.  CURRENT MEDS . amLODipine  5 mg Oral Daily  . carbamazepine  500 mg Oral BID  . clopidogrel  75 mg Oral Daily  . DULoxetine  60 mg Oral q morning - 10a  . enoxaparin (LOVENOX) injection  40 mg Subcutaneous Daily  . ethosuximide  250 mg Oral Q breakfast   And  . ethosuximide  500 mg Oral BID  . ezetimibe  10 mg Oral Daily  . levothyroxine  50 mcg Oral QAC breakfast  . metoprolol succinate  50 mg Oral Daily  . ranolazine  500 mg Oral BID  . sodium chloride flush  3 mL Intravenous Q12H  . tiZANidine  4-8 mg Oral QHS    OBJECTIVE  Filed Vitals:   07/28/15 1600 07/28/15 1845 07/28/15 1952 07/29/15 0349  BP: 133/61 125/58 139/58 121/57  Pulse: 60 59 69 60  Temp:  97.8 F (36.6 C) 97.5 F (36.4 C) 98 F (36.7 C)  TempSrc:   Oral Oral  Resp:  16 17 18   Height:      Weight:      SpO2: 100% 99% 98% 100%    Intake/Output Summary (Last 24 hours) at 07/29/15 0809 Last data filed at 07/28/15 1853  Gross per 24 hour  Intake      3 ml  Output      0 ml  Net      3 ml   Filed Weights   07/25/15 1740  Weight: 190 lb (86.183 kg)    PHYSICAL EXAM  General: Pleasant, NAD. Neuro: Grossly intact Psych: Normal affect. HEENT:  Normal  Neck: Supple Lungs:  CTA. Heart: RRR  Abdomen: Soft, non-tender, non-distended, Right groin with no hematoma and no bruit Extremities: No edema.   Accessory Clinical Findings  CBC  Recent Labs  07/27/15 0745 07/29/15 0249  WBC 7.3 7.3  NEUTROABS 3.9  --   HGB 15.0 12.1  HCT 44.2 37.4  MCV 87.5 88.0  PLT 277  [id number]   Basic Metabolic Panel  Recent Labs  07/27/15 0745 07/29/15 0249  NA 137 139  K 4.1 4.2  CL 103 108  CO2 24 25  GLUCOSE 98 97  BUN 10 7  CREATININE 0.63 0.56  CALCIUM 9.2 8.4*   Cardiac Enzymes  Recent Labs  07/26/15 1245  TROPONINI <0.03       ECG  No new EKG  Echocardiogram 07/26/2015  LV EF: 60% -  65%  ------------------------------------------------------------------- Indications:   Chest pain 786.51.  ------------------------------------------------------------------- History:  PMH: Colon cancer. Coronary artery disease. Risk factors: Dyslipidemia.  ------------------------------------------------------------------- Study Conclusions  - Left ventricle: The cavity size was normal. There was mild concentric hypertrophy. Systolic function was normal. The estimated ejection fraction was in the range of 60% to 65%. Wall motion was normal; there were no regional wall motion abnormalities. Left ventricular diastolic function parameters were normal. - Mitral valve: There was mild regurgitation. - Atrial septum: No defect or patent foramen ovale was identified bu color flow Doppler. - Tricuspid valve: There was trivial regurgitation. -  Inferior vena cava: The vessel was normal in size. The respirophasic diameter changes were in the normal range (>= 50%), consistent with normal central venous pressure. - Global longitudinal strain -18.6%.     Radiology/Studies  Dg Chest 2 View  07/25/2015  CLINICAL DATA:  Chest pain.  Coronary artery disease. EXAM: CHEST  2 VIEW COMPARISON:  03/26/2015 FINDINGS: The heart size and mediastinal contours are within normal limits. Both lungs are clear. No evidence of pneumothorax or hemothorax. Dual lead transvenous pacemaker remains in appropriate position. Prior CABG again noted. IMPRESSION: No active cardiopulmonary disease. Electronically Signed   By: Earle Gell M.D.   On: 07/25/2015 18:36    Nm Myocar Multi W/spect W/wall Motion / Ef  07/27/2015  CLINICAL DATA:  Chest pain. Myocardial infarction. Prior cardiac catheterization. Coronary artery disease. EXAM: MYOCARDIAL IMAGING WITH SPECT (REST AND EXERCISE) GATED LEFT VENTRICULAR WALL MOTION STUDY LEFT VENTRICULAR EJECTION FRACTION TECHNIQUE: Standard myocardial SPECT imaging was performed after resting intravenous injection of 10 mCi Tc-65m sestamibi. Subsequently, exercise tolerance test was performed by the patient under the supervision of the Cardiology staff. At peak-stress, 30 mCi Tc-66m sestamibi was injected intravenously and standard myocardial SPECT imaging was performed. Quantitative gated imaging was also performed to evaluate left ventricular wall motion, and estimate left ventricular ejection fraction. COMPARISON:  10/27/2014 FINDINGS: Perfusion: Matching perfusion defect in the anteroseptal and anterior wall on stress and rest images, medium severity moderate-sized, extending towards the cardiac apex. There is up to 13% reduction in inferior wall activity on stress images compared to rest images. There is some high activity in the stomach which may be falsely elevating the inferior wall counts on the rest images, but in general the appearance is considered suspicious for inducible ischemia. Moderate size moderate severity. Wall Motion: Inferoseptal hypokinesis and poor wall thickening. Left Ventricular Ejection Fraction: 48 % End diastolic volume 92 ml End systolic volume 47 ml IMPRESSION: 1. Suspected moderate-sized region of inducible ischemia along the inferior wall. However, this might be exaggerated by the elevated counts in the inferior wall on rest images cause by activity in the stomach. There is also a moderate-sized moderate severity matched defect in the anteroseptal an anterior wall compatible with scar. 2. Hypokinesis and poor wall thickening in the inferoseptal wall. 3. Left ventricular ejection fraction 48% 4.  Intermediate-risk stress test findings*. *2012 Appropriate Use Criteria for Coronary Revascularization Focused Update: J Am Coll Cardiol. [id number]. http://[email or web address]?articleid=[email or web address] Electronically Signed   By: Van Clines M.D.   On: 07/27/2015 11:45    ASSESSMENT AND PLAN  1. Chest pain  - cath results noted; plan medical therapy for now; if persistent symptoms despite meds, consider PCI of RCA; continue plavix, norvasc and metoprolol; dc ranexa. FU Dr [name] 2-4 weeks following DC 2. CAD s/p CABG-continue plavix; intolerant to ASA and statin. 3. HLD intolerant to statin 4. H/o PPM-possible failure to capture on telemetry; interrogate device. Pt can be DCed if device ok Signed, Kirk Ruths

## 2015-07-30 ENCOUNTER — Telehealth: Payer: Self-pay | Admitting: Cardiology

## 2015-07-30 NOTE — Telephone Encounter (Signed)
New problem   Pt want to know how many time a day can she take Nitro. Please advise

## 2015-07-30 NOTE — Telephone Encounter (Signed)
Call goes straight to VM. Left message for patient to call.

## 2015-08-10 ENCOUNTER — Telehealth: Payer: Self-pay | Admitting: Neurology

## 2015-08-10 ENCOUNTER — Ambulatory Visit (INDEPENDENT_AMBULATORY_CARE_PROVIDER_SITE_OTHER): Payer: BLUE CROSS/BLUE SHIELD | Admitting: Neurology

## 2015-08-10 ENCOUNTER — Encounter: Payer: Self-pay | Admitting: Neurology

## 2015-08-10 VITALS — BP 139/89 | HR 63 | Ht 68.5 in | Wt 192.5 lb

## 2015-08-10 DIAGNOSIS — R569 Unspecified convulsions: Secondary | ICD-10-CM | POA: Diagnosis not present

## 2015-08-10 DIAGNOSIS — R55 Syncope and collapse: Secondary | ICD-10-CM

## 2015-08-10 NOTE — Patient Instructions (Signed)

## 2015-08-10 NOTE — Progress Notes (Signed)
Reason for visit: Syncope  Referring physician: [facility]  BRIONNAH BERSTLER is a 65 y.o. female  History of present illness:  Ms. Sciascia is a 65 year old right-handed white female with a history of seizures, the last seizure occurred at age 65. The patient is on carbamazepine and ethosuxamide for her seizures. The patient has not wanted to give a trial off of her seizure medications, she wishes to continue her medications. The patient has been operating a motor vehicle. Within the last 6 weeks, the patient began having episodes of dizziness and a single episode of syncope was noted. The patient would have a flushing sensation starting at the knees going up on the body to the head, the patient will look flushed in the face, she would experience some chest pain. If she would lie down, the symptoms may improve. The patient would take nitroglycerin for these episodes with improvement. The patient recently was in the hospital around 07/25/2015. The patient was found to have coronary artery disease, and her medications were adjusted. The patient has done much better, she has not had any events of dizziness or syncope or near syncope since the hospitalization. The patient had a pacemaker placed in September 2016 secondary to episodes of bradycardia. The interrogation of the pacemaker according to the patient showed no definite cardiac rhythm abnormalities have explained the episodes of dizziness, chest pain, and syncope. The patient was told that she may have vasovagal syncope, she is sent to this office for an evaluation. The patient denied any problems with feeling diaphoretic, nauseated, or any issues with the sensation of needing to have a bowel movement. The patient indicates that many of her episodes occurred while sitting. She was having events about every other day prior to the hospitalization, she has gone 2 weeks without any events now. She indicates that she did sense an increased heart rate  during the events. She denies any focal numbness or weakness of the face, arms, or legs. She denies headache or visual field disturbances or speech disturbances.  Past Medical History  Diagnosis Date  . Coronary artery disease     a. remote NSTEMI with PCI to the RCA; b. S/P CABG in 2007; c. 2010 - 2 v CAD with L-LAD and S-RCA patent, small caliber Dx 70% (treated medically - no amenable to PCI), CFX free of significant disease, normal LVF. d. 10/2014 Lexi MV: EF 61%, no ischemia/infarct.  . Dyslipidemia   . Monoclonal gammopathy of unknown significance   . Colon cancer ([place]) 12/04/2011    s/p Laparoscopic-assisted transverse colectomy on 12/19/2011 by Dr. Donne Hazel.  pT3 N0 M0.   Marland Kitchen Hyperlipidemia   . Fibromyalgia   . GERD (gastroesophageal reflux disease)   . Gastric ulcer   . Hypothyroid   . Depression   . Iron deficiency anemia 11/17/2011  . Grand mal epilepsy, controlled ([place]) 12/06/11    last seizure was in 1972 ;takes Tegretol (10/26/2014)   . Myocardial infarction ([place])   . Unstable angina ([place])   . Presence of permanent cardiac pacemaker   . Syncope and collapse 08/10/2015    Past Surgical History  Procedure Laterality Date  . Posterior laminectomy / decompression cervical spine      20+yrs ago  . Colonoscopy    . Colon resection  12/19/2011    Procedure: COLON RESECTION LAPAROSCOPIC;  Surgeon: Rolm Bookbinder, MD;  Location: [place];  Service: General;  Laterality: N/A;  laparoscopic hand assisted partial colon resection  . Portacath placement  06/04/2012    Procedure: INSERTION PORT-A-CATH;  Surgeon: Rolm Bookbinder, MD;  Location: [place];  Service: General;  Laterality: N/A;  Insertion of port-a-cath   . Port-a-cath removal N/A 06/12/2013    Procedure: REMOVAL PORT-A-CATH;  Surgeon: Rolm Bookbinder, MD;  Location: [place];  Service: General;  Laterality: N/A;  . Colon surgery    . Anterior cervical decomp/discectomy fusion  1980's  . Back surgery    .  Vaginal hysterectomy    . Dilation and curettage of uterus  1973  . Coronary artery bypass graft  2007    x 2  . Cardiac catheterization  05/20/2009    obstructive native vessel disease in LAD, RCA, and first diagonal, patent vein graft to distal RCA and LIMA to LAD,normal. ef 60%  . Coronary angioplasty with stent placement  ~ 2007    1 stent  . Cardiac catheterization N/A 03/24/2015    Procedure: Left Heart Cath and Cors/Grafts Angiography;  Surgeon: Wellington Hampshire, MD;  Location: [place] [facility];  Service: Cardiovascular;  Laterality: N/A;  . Ep implantable device N/A 03/25/2015    Procedure: Pacemaker Implant;  Surgeon: Thompson Grayer, MD;  Location: [place] [facility];  Service: Cardiovascular;  Laterality: N/A;  . Insert / replace / remove pacemaker    . Cardiac catheterization N/A 07/28/2015    Procedure: Left Heart Cath and Coronary Angiography;  Surgeon: Troy Sine, MD;  Location: [place] [facility];  Service: Cardiovascular;  Laterality: N/A;    Family History  Problem Relation Age of Onset  . Heart attack Father   . Heart disease Father   . Heart attack Brother 6  . Cancer Mother     lung  . Stroke Neg Hx     Social history:  reports that she quit smoking about 34 years ago. Her smoking use included Cigarettes. She has a 6 pack-year smoking history. She has never used smokeless tobacco. She reports that she drinks about 0.6 oz of alcohol per week. She reports that she does not use illicit drugs.  Medications:  Prior to Admission medications   Medication Sig Start Date End Date Taking? Authorizing Provider  acetaminophen (TYLENOL) 500 MG tablet Take 500-1,000 mg by mouth daily as needed for mild pain or headache.   Yes Historical Provider, MD  amLODipine (NORVASC) 5 MG tablet Take 1 tablet (5 mg total) by mouth daily. 07/29/15  Yes Maryann Mikhail, DO  Azelastine HCl 0.15 % SOLN Place 1 spray into the nose as needed (allergies).  12/22/14  Yes Historical Provider, MD    carbamazepine (TEGRETOL XR) 100 MG 12 hr tablet TAKE FIVE TABLETS BY MOUTH TWICE DAILY Patient taking differently: Take 500 mg by mouth 2 (two) times daily. TAKE FIVE TABLETS BY MOUTH TWICE DAILY 05/19/15  Yes Kathrynn Ducking, MD  clopidogrel (PLAVIX) 75 MG tablet TAKE 1 TABLET (75 MG TOTAL) BY MOUTH DAILY. START ON 03/29/15 07/23/15  Yes Peter M [name], MD  DULoxetine (CYMBALTA) 60 MG capsule Take 60 mg by mouth every morning.   Yes Historical Provider, MD  ethosuximide (ZARONTIN) 250 MG capsule Take 1 capsule (250mg ) in the morning, 2 capsules (500mg ) in the afternoon, and 2 capsules (500mg ) at night 05/15/15  Yes Penni Bombard, MD  ezetimibe (ZETIA) 10 MG tablet Take 1 tablet (10 mg total) by mouth daily. 07/09/14  Yes Peter M [name], MD  levothyroxine (SYNTHROID, LEVOTHROID) 50 MCG tablet Take 50 mcg by mouth daily.  Yes Historical Provider, MD  metoprolol succinate (TOPROL-XL) 50 MG 24 hr tablet Take 1 tablet (50 mg total) by mouth daily. Take with or immediately following a meal. 07/29/15  Yes Maryann Mikhail, DO  MINIVELLE 0.05 MG/24HR patch Apply 1 patch topically 2 (two) times a week. Monday and Thursday 12/08/14  Yes Historical Provider, MD  nitroGLYCERIN (NITROSTAT) 0.4 MG SL tablet Place 1 tablet (0.4 mg total) under the tongue every 5 (five) minutes as needed. For chest pain. 04/05/15  Yes Burtis Junes, NP  tiZANidine (ZANAFLEX) 2 MG tablet Take 2-4 tablets by mouth at bedtime.  01/06/15  Yes Historical Provider, MD      Allergies  Allergen Reactions  . Aspirin Other (See Comments)    GI upset  . Erythromycin Nausea Only    Gi upset  . Imdur [Isosorbide Mononitrate]     Pt does not recall reaction  . Lisinopril     cough  . Niacin And Related     Pt does not recall reactions   . Penicillins Nausea Only    Has patient had a PCN reaction causing immediate rash, facial/tongue/throat swelling, SOB or lightheadedness with hypotension: Yes Has patient had a PCN reaction causing  severe rash involving mucus membranes or skin necrosis: No Has patient had a PCN reaction that required hospitalization No Has patient had a PCN reaction occurring within the last 10 years: No If all of the above answers are "NO", then may proceed with Cephalosporin use.   . Statins     Muscle pain  . Sulfa Drugs Cross Reactors Swelling  . Tetracyclines & Related Nausea Only    ROS:  Out of a complete 14 system review of symptoms, the patient complains only of the following symptoms, and all other reviewed systems are negative.  Dizziness, syncope Chest pain  Blood pressure 139/89, pulse 63, height 5' 8.5" (1.74 m), weight 192 lb 8 oz (87.317 kg).  Physical Exam  General: The patient is alert and cooperative at the time of the examination.  Eyes: Pupils are equal, round, and reactive to light. Discs are flat bilaterally.  Neck: The neck is supple, no carotid bruits are noted.  Respiratory: The respiratory examination is clear.  Cardiovascular: The cardiovascular examination reveals a regular rate and rhythm, no obvious murmurs or rubs are noted.  Skin: Extremities are without significant edema.  Neurologic Exam  Mental status: The patient is alert and oriented x 3 at the time of the examination. The patient has apparent normal recent and remote memory, with an apparently normal attention span and concentration ability.  Cranial nerves: Facial symmetry is present. There is good sensation of the face to pinprick and soft touch bilaterally. The strength of the facial muscles and the muscles to head turning and shoulder shrug are normal bilaterally. Speech is well enunciated, no aphasia or dysarthria is noted. Extraocular movements are full. Visual fields are full. The tongue is midline, and the patient has symmetric elevation of the soft palate. No obvious hearing deficits are noted.  Motor: The motor testing reveals 5 over 5 strength of all 4 extremities. Good symmetric motor  tone is noted throughout.  Sensory: Sensory testing is intact to pinprick, soft touch, vibration sensation, and position sense on all 4 extremities, with exception of some decrease in position sense in both feet. No stocking pattern pinprick sensory deficit was noted. No evidence of extinction is noted.  Coordination: Cerebellar testing reveals good finger-nose-finger and heel-to-shin bilaterally.  Gait and station:  Gait is normal. Tandem gait is normal. Romberg is negative. No drift is seen.  Reflexes: Deep tendon reflexes are symmetric and normal bilaterally, with exception of decreased ankle jerk reflexes bilaterally. Toes are downgoing bilaterally.   Assessment/Plan:  1. History of seizures, well controlled  2. Dizziness, syncope  The patient has had recent documentation of coronary artery disease. Treatment of this has resulted in cessation of the episodes of dizziness and near syncope and syncope. The episodes may have been related to cardiac ischemia, the episodes may not have represented vasovagal syncopal events. The patient was set up for a carotid Doppler study, she will follow-up for her next scheduled visit in August 2017.  Jill Alexanders MD 08/10/2015 9:18 PM  [facility] [address] [place], [place] [address]  Phone [phone] Fax [phone]

## 2015-08-10 NOTE — Telephone Encounter (Signed)
Called and left patient and message relayed the office will call her to schedule doppler soon in next few weeks . If patient has any questions please call me back. Doppler will be done here at [facility].

## 2015-08-16 ENCOUNTER — Ambulatory Visit: Payer: Medicare Other | Admitting: Physician Assistant

## 2015-08-16 ENCOUNTER — Encounter: Payer: Self-pay | Admitting: Internal Medicine

## 2015-08-16 ENCOUNTER — Ambulatory Visit (INDEPENDENT_AMBULATORY_CARE_PROVIDER_SITE_OTHER): Payer: BLUE CROSS/BLUE SHIELD | Admitting: Internal Medicine

## 2015-08-16 VITALS — BP 154/78 | HR 92 | Ht 69.0 in | Wt 193.2 lb

## 2015-08-16 DIAGNOSIS — I495 Sick sinus syndrome: Secondary | ICD-10-CM

## 2015-08-16 DIAGNOSIS — I2510 Atherosclerotic heart disease of native coronary artery without angina pectoris: Secondary | ICD-10-CM

## 2015-08-16 DIAGNOSIS — I25110 Atherosclerotic heart disease of native coronary artery with unstable angina pectoris: Secondary | ICD-10-CM | POA: Diagnosis not present

## 2015-08-16 DIAGNOSIS — Z950 Presence of cardiac pacemaker: Secondary | ICD-10-CM

## 2015-08-16 DIAGNOSIS — R55 Syncope and collapse: Secondary | ICD-10-CM | POA: Diagnosis not present

## 2015-08-16 LAB — CUP PACEART INCLINIC DEVICE CHECK
Battery Remaining Longevity: 127 mo
Brady Statistic AP VP Percent: 0.03 %
Brady Statistic AP VS Percent: 34.14 %
Brady Statistic AS VP Percent: 0.03 %
Brady Statistic AS VS Percent: 65.8 %
Brady Statistic RV Percent Paced: 0.06 %
Date Time Interrogation Session: 20170213094143
Implantable Lead Implant Date: 20160922
Implantable Lead Location: 753859
Implantable Lead Model: 5076
Lead Channel Impedance Value: 399 Ohm
Lead Channel Impedance Value: 418 Ohm
Lead Channel Impedance Value: 513 Ohm
Lead Channel Pacing Threshold Amplitude: 0.625 V
Lead Channel Pacing Threshold Pulse Width: 0.4 ms
Lead Channel Sensing Intrinsic Amplitude: 2.125 mV
Lead Channel Sensing Intrinsic Amplitude: 2.625 mV
Lead Channel Sensing Intrinsic Amplitude: 7.875 mV
Lead Channel Setting Pacing Amplitude: 2.5 V
Lead Channel Setting Sensing Sensitivity: 2.8 mV
MDC IDC LEAD IMPLANT DT: 20160922
MDC IDC LEAD LOCATION: 753860
MDC IDC MSMT BATTERY VOLTAGE: 3.04 V
MDC IDC MSMT LEADCHNL RA PACING THRESHOLD AMPLITUDE: 0.875 V
MDC IDC MSMT LEADCHNL RA PACING THRESHOLD PULSEWIDTH: 0.4 ms
MDC IDC MSMT LEADCHNL RV IMPEDANCE VALUE: 437 Ohm
MDC IDC MSMT LEADCHNL RV SENSING INTR AMPL: 11.875 mV
MDC IDC SET LEADCHNL RA PACING AMPLITUDE: 2 V
MDC IDC SET LEADCHNL RV PACING PULSEWIDTH: 0.4 ms
MDC IDC STAT BRADY RA PERCENT PACED: 34.17 %

## 2015-08-16 NOTE — Progress Notes (Signed)
PCP: Vidal Schwalbe, MD Primary Cardiologist:  Dr [name]  Rachel Clark is a 65 y.o. female who presents today for routine electrophysiology followup.  Since her pacemaker implantation, the patient reports doing very well.   Today, she denies symptoms of palpitations,  shortness of breath,  lower extremity edema, dizziness, presyncope, or syncope.  She continues to have chest pain for which she is seeing Dr [name] soon. The patient is otherwise without complaint today.   Past Medical History  Diagnosis Date  . Coronary artery disease     a. remote NSTEMI with PCI to the RCA; b. S/P CABG in 2007; c. 2010 - 2 v CAD with L-LAD and S-RCA patent, small caliber Dx 70% (treated medically - no amenable to PCI), CFX free of significant disease, normal LVF. d. 10/2014 Lexi MV: EF 61%, no ischemia/infarct.  . Dyslipidemia   . Monoclonal gammopathy of unknown significance   . Colon cancer ([place]) 12/04/2011    s/p Laparoscopic-assisted transverse colectomy on 12/19/2011 by Dr. Donne Hazel.  pT3 N0 M0.   Marland Kitchen Hyperlipidemia   . Fibromyalgia   . GERD (gastroesophageal reflux disease)   . Gastric ulcer   . Hypothyroid   . Depression   . Iron deficiency anemia 11/17/2011  . 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 Myocardial infarction ([place])   . Unstable angina ([place])   . Presence of permanent cardiac pacemaker   . Syncope and collapse 08/10/2015   Past Surgical History  Procedure Laterality Date  . Posterior laminectomy / decompression cervical spine      20+yrs ago  . Colonoscopy    . Colon resection  12/19/2011    Procedure: COLON RESECTION LAPAROSCOPIC;  Surgeon: Rolm Bookbinder, MD;  Location: [place];  Service: General;  Laterality: N/A;  laparoscopic hand assisted partial colon resection  . Portacath placement  06/04/2012    Procedure: INSERTION PORT-A-CATH;  Surgeon: Rolm Bookbinder, MD;  Location: [place];  Service: General;  Laterality:  N/A;  Insertion of port-a-cath   . Port-a-cath removal N/A 06/12/2013    Procedure: REMOVAL PORT-A-CATH;  Surgeon: Rolm Bookbinder, MD;  Location: [place];  Service: General;  Laterality: N/A;  . Colon surgery    . Anterior cervical decomp/discectomy fusion  1980's  . Back surgery    . Vaginal hysterectomy    . Dilation and curettage of uterus  1973  . Coronary artery bypass graft  2007    x 2  . Cardiac catheterization  05/20/2009    obstructive native vessel disease in LAD, RCA, and first diagonal, patent vein graft to distal RCA and LIMA to LAD,normal. ef 60%  . Coronary angioplasty with stent placement  ~ 2007    1 stent  . Cardiac catheterization N/A 03/24/2015    Procedure: Left Heart Cath and Cors/Grafts Angiography;  Surgeon: Wellington Hampshire, MD;  Location: [place] [facility];  Service: Cardiovascular;  Laterality: N/A;  . Ep implantable device N/A 03/25/2015    Procedure: Pacemaker Implant;  Surgeon: Thompson Grayer, MD;  Location: [place] [facility];  Service: Cardiovascular;  Laterality: N/A;  . Insert / replace / remove pacemaker    . Cardiac catheterization N/A 07/28/2015    Procedure: Left Heart Cath and Coronary Angiography;  Surgeon: Troy Sine, MD;  Location: [place] [facility];  Service: Cardiovascular;  Laterality: N/A;    ROS- all systems are reviewed and negative except as  per HPI above  Current Outpatient Prescriptions  Medication Sig Dispense Refill  . acetaminophen (TYLENOL) 500 MG tablet Take 500-1,000 mg by mouth daily as needed for mild pain or headache.    Marland Kitchen amLODipine (NORVASC) 5 MG tablet Take 1 tablet (5 mg total) by mouth daily. 30 tablet 0  . Azelastine HCl 0.15 % SOLN Place 1 spray into the nose as needed (allergies).   12  . carbamazepine (TEGRETOL XR) 100 MG 12 hr tablet TAKE FIVE TABLETS BY MOUTH TWICE DAILY 900 tablet 2  . clopidogrel (PLAVIX) 75 MG tablet TAKE 1 TABLET (75 MG TOTAL) BY MOUTH DAILY. START ON 03/29/15 30 tablet 0   . DULoxetine (CYMBALTA) 60 MG capsule Take 60 mg by mouth every morning.    Marland Kitchen ethosuximide (ZARONTIN) 250 MG capsule Take 1 capsule (250mg ) in the morning, 2 capsules (500mg ) in the afternoon, and 2 capsules (500mg ) at night 150 capsule 0  . ezetimibe (ZETIA) 10 MG tablet Take 1 tablet (10 mg total) by mouth daily. 30 tablet 3  . levothyroxine (SYNTHROID, LEVOTHROID) 50 MCG tablet Take 50 mcg by mouth daily.     . metoprolol succinate (TOPROL-XL) 50 MG 24 hr tablet Take 1 tablet (50 mg total) by mouth daily. Take with or immediately following a meal. 30 tablet 0  . MINIVELLE 0.05 MG/24HR patch Apply 1 patch topically 2 (two) times a week. Monday and Thursday  12  . nitroGLYCERIN (NITROSTAT) 0.4 MG SL tablet Place 1 tablet (0.4 mg total) under the tongue every 5 (five) minutes as needed. For chest pain. 25 tablet 6  . tiZANidine (ZANAFLEX) 2 MG tablet Take 2-4 tablets by mouth at bedtime.   2   No current facility-administered medications for this visit.    Physical Exam: Filed Vitals:   08/16/15 0914  BP: 154/78  Pulse: 92  Height: 5\' 9"  (1.753 m)  Weight: 193 lb 3.2 oz (87.635 kg)    GEN- The patient is well appearing, alert and oriented x 3 today.   Head- normocephalic, atraumatic Eyes-  Sclera clear, conjunctiva pink Ears- hearing intact Oropharynx- clear Lungs- Clear to ausculation bilaterally, normal work of breathing Chest- pacemaker pocket is well healed Heart- Regular rate and rhythm, no murmurs, rubs or gallops, PMI not laterally displaced GI- soft, NT, ND, + BS Extremities- no clubbing, cyanosis, or edema  Pacemaker interrogation- reviewed in detail today,  See PACEART report  Assessment and Plan:  1. Sick sinus syndrome/ transient complete heart block Normal pacemaker function See Pace Art report No changes today  2. Hypertension Elevated I have encouraged her to check her BP at home May need titration of amlodipine or toprol for angina by Dr [name].  I have  therefore made no change at this time.  3. Cad Continues to have occasional CP Will follow-up with Dr [name]  carelink Return to see EP NP in 1 year  Thompson Grayer MD, [facility] 08/16/2015 9:41 AM

## 2015-08-16 NOTE — Patient Instructions (Signed)
Medication Instructions:  Your physician recommends that you continue on your current medications as directed. Please refer to the Current Medication list given to you today.   Labwork: None ordered   Testing/Procedures: None ordered   Follow-Up: Your physician wants you to follow-up in: 12 months with Chanetta Marshall, NP You will receive a reminder letter in the mail two months in advance. If you don't receive a letter, please call our office to schedule the follow-up appointment.    Remote monitoring is used to monitor your Pacemaker  from home. This monitoring reduces the number of office visits required to check your device to one time per year. It allows [place] to keep an eye on the functioning of your device to ensure it is working properly. You are scheduled for a device check from home on 11/15/15. You may send your transmission at any time that day. If you have a wireless device, the transmission will be sent automatically. After your physician reviews your transmission, you will receive a postcard with your next transmission date.    Any Other Special Instructions Will Be Listed Below (If Applicable).     If you need a refill on your cardiac medications before your next appointment, please call your pharmacy.

## 2015-08-17 ENCOUNTER — Other Ambulatory Visit: Payer: Self-pay | Admitting: Cardiology

## 2015-08-17 MED ORDER — EZETIMIBE 10 MG PO TABS: 10.0000 mg | ORAL_TABLET | Freq: Every day | ORAL | Status: DC

## 2015-08-25 ENCOUNTER — Encounter: Payer: Self-pay | Admitting: Physician Assistant

## 2015-08-25 ENCOUNTER — Ambulatory Visit (INDEPENDENT_AMBULATORY_CARE_PROVIDER_SITE_OTHER): Payer: BLUE CROSS/BLUE SHIELD | Admitting: Physician Assistant

## 2015-08-25 VITALS — BP 150/80 | HR 62 | Ht 69.0 in | Wt 194.8 lb

## 2015-08-25 DIAGNOSIS — I10 Essential (primary) hypertension: Secondary | ICD-10-CM | POA: Diagnosis not present

## 2015-08-25 DIAGNOSIS — I2583 Coronary atherosclerosis due to lipid rich plaque: Principal | ICD-10-CM

## 2015-08-25 DIAGNOSIS — I209 Angina pectoris, unspecified: Secondary | ICD-10-CM

## 2015-08-25 DIAGNOSIS — E785 Hyperlipidemia, unspecified: Secondary | ICD-10-CM

## 2015-08-25 DIAGNOSIS — I2510 Atherosclerotic heart disease of native coronary artery without angina pectoris: Secondary | ICD-10-CM | POA: Diagnosis not present

## 2015-08-25 MED ORDER — ISOSORBIDE MONONITRATE ER 30 MG PO TB24: ORAL_TABLET | ORAL | Status: DC

## 2015-08-25 NOTE — Patient Instructions (Addendum)
Medication Instructions:   1. START isosorbide mononitrate   -- take 1/2 tablet (15mg ) by mouth TODAY 2/22 and TOMORROW 2/23  -- on Friday 2/24, increase to a whole tablet (30mg )  Labwork:  none  Testing/Procedures:  none  Follow-Up:  2-3 months with Dr. [name]  Any Other Special Instructions Will Be Listed Below (If Applicable).  Gaspar Bidding, PA recommends that you purchase a BLOOD PRESSURE CUFF - [employer]ron is a good brand Monitor your blood pressure - it should be under 130/80  -- please call if it is not  Please call if your chest pain does not resolve with the new medication (imdur)

## 2015-08-25 NOTE — Progress Notes (Signed)
Patient ID: Rachel Clark, female   DOB: 27-Jan-1951, 65 y.o.   MRN: [id number]    Date:  08/25/2015   ID:  Rachel Clark, DOB 26-Oct-1950, MRN [id number]  PCP:  Vidal Schwalbe, MD  Primary Cardiologist:  [name]  Chief Complaint  Patient presents with  . Follow-up    one month post cath - no intervention  chest pain/tightness - has take NTG; short of breath; lightheaded/dizziness; device check w/Allred, MD 2/13     History of Present Illness: Rachel Clark is a 65 y.o. female had bypass grafting in 2007 with a mammary graft to the LAD and a vein graft to right coronary artery. She later had stage III colon cancer and had surgery followed by chemotherapy. She has hyperlipidemia with statin intolerance and relatively preserved LV function. She had a syncopal episode as well as angina and had catheterization in September this past year showing occlusion of the graft to the right coronary artery. She had moderate stenosis in the circumflex and some disease and some small diagonal branches. The mammary graft was patent.Patient was admitted January 2017 and underwent cardiac catheterization (see results below). Medical therapy was recommended.  She has persistent symptoms we'll need to consider PCI to the RCA. At this point she is on Plavix, Norvasc, metoprolol. Ranexa was discontinued.  She presents for posthospital follow-up.  She has had a few instances of chest pain, the most rec essential hypertensionent this morning, which was mild.  2 weeks ago it was more severe and she took an NTG.  The patient currently denies nausea, vomiting, fever, shortness of breath, orthopnea, dizziness, PND, cough, congestion, abdominal pain, hematochezia, melena, lower extremity edema, claudication.  Her husband and her are leaving to go over seas([place]) at the end of March.    Diagnostic Diagram          Conclusion     Ost RCA to Prox RCA lesion, 90% stenosed. The lesion was previously  treated with a stent (unknown type).  Prox RCA lesion, 99% stenosed.  Mid RCA lesion, 95% stenosed.  Dist RCA lesion, 80% stenosed.  SVG was injected is small.  There is severe disease in the graft.  Mid Graft lesion, 100% stenosed.  Ramus lesion, 20% stenosed.  Ost LAD to Mid LAD lesion, 50% stenosed.  1st Diag lesion, 70% stenosed.  Dist LAD lesion, 100% stenosed.  LIMA was injected is normal in caliber, and is anatomically normal.      Wt Readings from Last 3 Encounters:  08/25/15 194 lb 12.8 oz (88.361 kg)  08/16/15 193 lb 3.2 oz (87.635 kg)  08/10/15 192 lb 8 oz (87.317 kg)     Past Medical History  Diagnosis Date  . Coronary artery disease     a. remote NSTEMI with PCI to the RCA; b. S/P CABG in 2007; c. 2010 - 2 v CAD with L-LAD and S-RCA patent, small caliber Dx 70% (treated medically - no amenable to PCI), CFX free of significant disease, normal LVF. d. 10/2014 Lexi MV: EF 61%, no ischemia/infarct.  . Dyslipidemia   . Monoclonal gammopathy of unknown significance   . Colon cancer ([place]) 12/04/2011    s/p Laparoscopic-assisted transverse colectomy on 12/19/2011 by Dr. Donne Hazel.  pT3 N0 M0.   Marland Kitchen Hyperlipidemia   . Fibromyalgia   . GERD (gastroesophageal reflux disease)   . Gastric ulcer   . Hypothyroid   . Depression   . Iron deficiency anemia 11/17/2011  . 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 Myocardial infarction ([place])   . Unstable angina ([place])   . Syncope and collapse     pacemaker implanted    Current Outpatient Prescriptions  Medication Sig Dispense Refill  . acetaminophen (TYLENOL) 500 MG tablet Take 500-1,000 mg by mouth daily as needed for mild pain or headache.    Marland Kitchen amLODipine (NORVASC) 5 MG tablet Take 1 tablet (5 mg total) by mouth daily. 30 tablet 0  . Azelastine HCl 0.15 % SOLN Place 1 spray into the nose as needed (allergies).   12  . carbamazepine (TEGRETOL XR) 100 MG 12 hr  tablet TAKE FIVE TABLETS BY MOUTH TWICE DAILY 900 tablet 2  . clopidogrel (PLAVIX) 75 MG tablet TAKE 1 TABLET (75 MG TOTAL) BY MOUTH DAILY. START ON 03/29/15 30 tablet 0  . DULoxetine (CYMBALTA) 60 MG capsule Take 60 mg by mouth every morning.    Marland Kitchen ethosuximide (ZARONTIN) 250 MG capsule Take 1 capsule (250mg ) in the morning, 2 capsules (500mg ) in the afternoon, and 2 capsules (500mg ) at night 150 capsule 0  . ezetimibe (ZETIA) 10 MG tablet Take 1 tablet (10 mg total) by mouth daily. 30 tablet 6  . levothyroxine (SYNTHROID, LEVOTHROID) 50 MCG tablet Take 50 mcg by mouth daily.     . metoprolol succinate (TOPROL-XL) 50 MG 24 hr tablet Take 1 tablet (50 mg total) by mouth daily. Take with or immediately following a meal. 30 tablet 0  . MINIVELLE 0.05 MG/24HR patch Apply 1 patch topically 2 (two) times a week. Monday and Thursday  12  . nitroGLYCERIN (NITROSTAT) 0.4 MG SL tablet Place 1 tablet (0.4 mg total) under the tongue every 5 (five) minutes as needed. For chest pain. 25 tablet 6  . tiZANidine (ZANAFLEX) 2 MG tablet Take 2-4 tablets by mouth at bedtime.   2  . isosorbide mononitrate (IMDUR) 30 MG 24 hr tablet Take 0.5 tablet (15mg ) by mouth for 2 days then increase to 1 tablet (30mg ) daily 30 tablet 5   No current facility-administered medications for this visit.    Allergies:    Allergies  Allergen Reactions  . Aspirin Other (See Comments)    GI upset  . Erythromycin Nausea Only    Gi upset  . Lisinopril     cough  . Niacin And Related     Pt does not recall reactions   . Penicillins Nausea Only    Has patient had a PCN reaction causing immediate rash, facial/tongue/throat swelling, SOB or lightheadedness with hypotension: Yes Has patient had a PCN reaction causing severe rash involving mucus membranes or skin necrosis: No Has patient had a PCN reaction that required hospitalization No Has patient had a PCN reaction occurring within the last 10 years: No If all of the above answers  are "NO", then may proceed with Cephalosporin use.   . Statins     Muscle pain  . Sulfa Drugs Cross Reactors Swelling  . Tetracyclines & Related Nausea Only    Social History:  The patient  reports that she quit smoking about 34 years ago. Her smoking use included Cigarettes. She has a 6 pack-year smoking history. She has never used smokeless tobacco. She reports that she drinks about 0.6 oz of alcohol per week. She reports that she does not use illicit drugs.   Family history:   Family History  Problem Relation Age of Onset  . Heart attack Father   .  Heart disease Father   . Heart attack Brother 24  . Cancer Mother     lung  . Stroke Neg Hx     ROS:  Please see the history of present illness.  All other systems reviewed and negative.   PHYSICAL EXAM: VS:  BP 150/80 mmHg  Pulse 62  Ht 5\' 9"  (1.753 m)  Wt 194 lb 12.8 oz (88.361 kg)  BMI 28.75 kg/m2 Well nourished, well developed, in no acute distress HEENT: Pupils are equal round react to light accommodation extraocular movements are intact.  Neck: no JVDNo cervical lymphadenopathy. Cardiac: Regular rate and rhythm without murmurs rubs or gallops. Lungs:  clear to auscultation bilaterally, no wheezing, rhonchi or rales Abd: soft, nontender, positive bowel sounds all quadrants, no hepatosplenomegaly Ext: no lower extremity edema.  2+ radial and dorsalis pedis pulses. Skin: warm and dry Neuro:  Grossly normal  EKG:  NSR 62BPM    ASSESSMENT AND PLAN:  Problem List Items Addressed This Visit    Essential hypertension   Relevant Medications   isosorbide mononitrate (IMDUR) 30 MG 24 hr tablet   Dyslipidemia (Chronic)   Coronary artery disease, post CABG 2007  - Primary (Chronic)   Relevant Medications   isosorbide mononitrate (IMDUR) 30 MG 24 hr tablet   Other Relevant Orders   EKG 12-Lead   Chest pain     Mrs. Bitting is having some intermittent chest pain on occasion. She does not remember what her allergy indoor  was and is not listed in the computer. We'll start her on 15 mg today and asked her to increase it to 30 mg on Friday. Start exercising. Also can monitor her blood pressure and make sure it is consistently under 130/80. Her husband said she does have white coat syndrome but that she does not check it at home at this time. If it is high and areolar basis she will call for frequency of chest pain does not diminish she will also call. I would like to get the Imdur titrated up for she leaves for her trip at the end of March.

## 2015-08-26 ENCOUNTER — Ambulatory Visit (INDEPENDENT_AMBULATORY_CARE_PROVIDER_SITE_OTHER): Payer: BLUE CROSS/BLUE SHIELD

## 2015-08-26 DIAGNOSIS — R569 Unspecified convulsions: Secondary | ICD-10-CM

## 2015-08-26 DIAGNOSIS — R55 Syncope and collapse: Secondary | ICD-10-CM

## 2015-08-29 ENCOUNTER — Other Ambulatory Visit: Payer: Self-pay | Admitting: Cardiology

## 2015-08-30 NOTE — Telephone Encounter (Signed)
Rx request sent to pharmacy.  

## 2015-09-06 ENCOUNTER — Telehealth: Payer: Self-pay | Admitting: Neurology

## 2015-09-06 NOTE — Telephone Encounter (Signed)
I called pt and advised her that I cannot find that we have the carotid doppler results yet, but I will check with Dr. Jannifer Franklin to make sure he doesn't have them yet either. As soon as we have them, our office will give her a call to discuss those results. Pt verbalized understanding.

## 2015-09-06 NOTE — Telephone Encounter (Signed)
I have not yet receive the doppler report. I will contact Dr. Leonie Man about this study.

## 2015-09-06 NOTE — Telephone Encounter (Signed)
Pt called requesting doppler results

## 2015-09-07 NOTE — Telephone Encounter (Signed)
This was read last week by me Katrina can you print out the report for dr Jannifer Franklin

## 2015-09-08 NOTE — Telephone Encounter (Signed)
I called patient. Carotid doppler. study is unremarkable. I discussed this with the patient.

## 2015-09-08 NOTE — Telephone Encounter (Signed)
Rn went to [employer] today about patients TCD report being incomplete. Tech stated he is working on it now. The report was incomplete with no data. Rn notified Dr.Willis of the incomplete report.

## 2015-09-09 ENCOUNTER — Other Ambulatory Visit: Payer: Self-pay | Admitting: *Deleted

## 2015-09-09 MED ORDER — AMLODIPINE BESYLATE 5 MG PO TABS: 5.0000 mg | ORAL_TABLET | Freq: Every day | ORAL | Status: DC

## 2015-09-09 MED ORDER — METOPROLOL SUCCINATE ER 50 MG PO TB24: 50.0000 mg | ORAL_TABLET | Freq: Every day | ORAL | Status: DC

## 2015-09-09 NOTE — Telephone Encounter (Signed)
Rx(s) sent to pharmacy electronically.  

## 2015-10-11 ENCOUNTER — Telehealth: Payer: Self-pay | Admitting: Cardiology

## 2015-10-11 NOTE — Telephone Encounter (Signed)
Returned call. Pt has noted progressively over a period of weeks, she seems to have become more confused. She is inquiring as to the possibility of medication causing this problem. Notes several relatively recent med changes (imdur, amlodipine, plavix, toprol) and is needing recommendation.  She also notes chest pain. This is not new, takes imdur, and she feels nitro treatment PRN controls adequately.  Aware I will defer to [facility] for advice.

## 2015-10-11 NOTE — Telephone Encounter (Signed)
New message   Pt c/o medication issue:  1. Name of Medication: amlopidine/ imdur /plavix/ toprol 2. How are you currently taking this medication (dosage and times per day)? Pt said that do not matter, the dosage or how many times I take it; have RN call me  3. Are you having a reaction (difficulty breathing--STAT)? no  4. What is your medication issue? Confusion/ chest pain pt

## 2015-10-11 NOTE — Telephone Encounter (Signed)
Left msg for patient to call. 

## 2015-10-11 NOTE — Telephone Encounter (Signed)
Agree  Skyrah Krupp MD, FACC   

## 2015-10-11 NOTE — Telephone Encounter (Signed)
Suspect that metoprolol could be the cause - it crosses into the brain and can cause hallucinations, nightmares and some confusion/memory loss.  Would recommend to switch to carvedilol, which does not have these same side effects.  Metoprolol succ 50 mg qd to carvedilol 12.5 mg bid if okay with Dr. [name]

## 2015-10-25 ENCOUNTER — Telehealth: Payer: Self-pay | Admitting: Cardiology

## 2015-10-25 MED ORDER — CARVEDILOL 12.5 MG PO TABS: 12.5000 mg | ORAL_TABLET | Freq: Two times a day (BID) | ORAL | Status: DC

## 2015-10-25 NOTE — Telephone Encounter (Signed)
Call still goes straight to VM

## 2015-10-25 NOTE — Telephone Encounter (Signed)
Left msg asking pt to call & have call transferred to triage RN (her phone when dialed goes directly to VM).

## 2015-10-25 NOTE — Telephone Encounter (Signed)
Rx info given for switching from metoprolol to carvedilol. Pt advised to give med adjustment a week, call if new problems.  Rx sent to her preferred pharmacy, pt aware.

## 2015-10-25 NOTE — Telephone Encounter (Signed)
Phone goes straight to VM. Will reattempt later.

## 2015-10-25 NOTE — Telephone Encounter (Signed)
Follow Up   Pt is returning the call from 4/10 about the possible symptoms of the medications she was taking amlopidine/ imdur /plavix/ toprol. Please call back to discuss.

## 2015-10-25 NOTE — Telephone Encounter (Signed)
FOLLOW UP    Pt verbalized that she is returning call to [employer]

## 2015-10-25 NOTE — Telephone Encounter (Signed)
Follow Up   Pt returned call. Tried to call triage no answer. Please call pt back.

## 2015-10-28 ENCOUNTER — Other Ambulatory Visit: Payer: Self-pay | Admitting: *Deleted

## 2015-10-28 DIAGNOSIS — I2510 Atherosclerotic heart disease of native coronary artery without angina pectoris: Secondary | ICD-10-CM

## 2015-10-28 DIAGNOSIS — I2583 Coronary atherosclerosis due to lipid rich plaque: Principal | ICD-10-CM

## 2015-10-28 MED ORDER — CLOPIDOGREL BISULFATE 75 MG PO TABS: 75.0000 mg | ORAL_TABLET | Freq: Every day | ORAL | Status: DC

## 2015-10-28 MED ORDER — AMLODIPINE BESYLATE 5 MG PO TABS: 5.0000 mg | ORAL_TABLET | Freq: Every day | ORAL | Status: DC

## 2015-10-28 MED ORDER — ISOSORBIDE MONONITRATE ER 30 MG PO TB24: ORAL_TABLET | ORAL | Status: DC

## 2015-10-28 MED ORDER — EZETIMIBE 10 MG PO TABS: 10.0000 mg | ORAL_TABLET | Freq: Every day | ORAL | Status: DC

## 2015-11-04 ENCOUNTER — Telehealth: Payer: Self-pay | Admitting: Cardiology

## 2015-11-04 NOTE — Telephone Encounter (Signed)
Returned call to patient.She stated she had episode of chest pain this morning while sitting at desk lasted appox 15 min.Stated she had episode yesterday did not last as long.Stated pain is strange it starts as a heat sensation in both knees and goes up body into chest and face.Stated same sensation she had before heart surgery.Appointment scheduled with Dr.Jordan 11/05/15 at 8:15 am.Advised to go to ER if needed.

## 2015-11-04 NOTE — Telephone Encounter (Signed)
New message    Pt c/o of Chest Pain: 1. Are you having CP right now? Yes patient states it is not terrible right now feeling like it is going away 2. Are you experiencing any other symptoms (ex. SOB, nausea, vomiting, sweating)? Sweating, no sob pt is usually sitting down when sweating down 3. How long have you been experiencing CP? Since yesterday and this morning 4. Is your CP continuous or coming and going? Coming and going like heat wave 5. Have you taken Nitroglycerin? No    Patient verbalized concern about the heat wave she is having cause of chest pain discomfort. Rated pain 1-10:1

## 2015-11-05 ENCOUNTER — Emergency Department (HOSPITAL_COMMUNITY): Payer: BLUE CROSS/BLUE SHIELD

## 2015-11-05 ENCOUNTER — Encounter (HOSPITAL_COMMUNITY)
Admission: EM | Disposition: A | Discharge status: Home / Self Care | Payer: Self-pay | Source: Home / Self Care | Attending: Cardiovascular Disease

## 2015-11-05 ENCOUNTER — Encounter: Payer: BLUE CROSS/BLUE SHIELD | Admitting: Cardiology

## 2015-11-05 ENCOUNTER — Encounter (HOSPITAL_COMMUNITY): Payer: Self-pay | Admitting: Emergency Medicine

## 2015-11-05 ENCOUNTER — Inpatient Hospital Stay (HOSPITAL_COMMUNITY)
Admission: EM | Admit: 2015-11-05 | Discharge: 2015-11-10 | DRG: 247 | Disposition: A | Discharge status: Home / Self Care | Payer: BLUE CROSS/BLUE SHIELD | Attending: Cardiovascular Disease | Admitting: Cardiovascular Disease

## 2015-11-05 ENCOUNTER — Telehealth: Payer: Self-pay

## 2015-11-05 ENCOUNTER — Telehealth: Payer: Self-pay | Admitting: Physician Assistant

## 2015-11-05 DIAGNOSIS — Z8249 Family history of ischemic heart disease and other diseases of the circulatory system: Secondary | ICD-10-CM

## 2015-11-05 DIAGNOSIS — F329 Major depressive disorder, single episode, unspecified: Secondary | ICD-10-CM | POA: Diagnosis present

## 2015-11-05 DIAGNOSIS — I252 Old myocardial infarction: Secondary | ICD-10-CM

## 2015-11-05 DIAGNOSIS — Z881 Allergy status to other antibiotic agents status: Secondary | ICD-10-CM

## 2015-11-05 DIAGNOSIS — D472 Monoclonal gammopathy: Secondary | ICD-10-CM | POA: Diagnosis present

## 2015-11-05 DIAGNOSIS — I2510 Atherosclerotic heart disease of native coronary artery without angina pectoris: Secondary | ICD-10-CM | POA: Diagnosis present

## 2015-11-05 DIAGNOSIS — R079 Chest pain, unspecified: Secondary | ICD-10-CM | POA: Diagnosis present

## 2015-11-05 DIAGNOSIS — I25110 Atherosclerotic heart disease of native coronary artery with unstable angina pectoris: Principal | ICD-10-CM | POA: Diagnosis present

## 2015-11-05 DIAGNOSIS — Z951 Presence of aortocoronary bypass graft: Secondary | ICD-10-CM

## 2015-11-05 DIAGNOSIS — Z880 Allergy status to penicillin: Secondary | ICD-10-CM

## 2015-11-05 DIAGNOSIS — Z9221 Personal history of antineoplastic chemotherapy: Secondary | ICD-10-CM

## 2015-11-05 DIAGNOSIS — Z87891 Personal history of nicotine dependence: Secondary | ICD-10-CM

## 2015-11-05 DIAGNOSIS — D509 Iron deficiency anemia, unspecified: Secondary | ICD-10-CM | POA: Diagnosis present

## 2015-11-05 DIAGNOSIS — Z950 Presence of cardiac pacemaker: Secondary | ICD-10-CM

## 2015-11-05 DIAGNOSIS — I10 Essential (primary) hypertension: Secondary | ICD-10-CM | POA: Diagnosis not present

## 2015-11-05 DIAGNOSIS — K219 Gastro-esophageal reflux disease without esophagitis: Secondary | ICD-10-CM | POA: Diagnosis present

## 2015-11-05 DIAGNOSIS — Z85038 Personal history of other malignant neoplasm of large intestine: Secondary | ICD-10-CM

## 2015-11-05 DIAGNOSIS — E785 Hyperlipidemia, unspecified: Secondary | ICD-10-CM | POA: Diagnosis not present

## 2015-11-05 DIAGNOSIS — Z79899 Other long term (current) drug therapy: Secondary | ICD-10-CM

## 2015-11-05 DIAGNOSIS — M797 Fibromyalgia: Secondary | ICD-10-CM | POA: Diagnosis not present

## 2015-11-05 DIAGNOSIS — I200 Unstable angina: Secondary | ICD-10-CM | POA: Diagnosis not present

## 2015-11-05 DIAGNOSIS — Z888 Allergy status to other drugs, medicaments and biological substances status: Secondary | ICD-10-CM

## 2015-11-05 DIAGNOSIS — Z7902 Long term (current) use of antithrombotics/antiplatelets: Secondary | ICD-10-CM

## 2015-11-05 DIAGNOSIS — Z886 Allergy status to analgesic agent status: Secondary | ICD-10-CM

## 2015-11-05 DIAGNOSIS — Z981 Arthrodesis status: Secondary | ICD-10-CM

## 2015-11-05 DIAGNOSIS — E039 Hypothyroidism, unspecified: Secondary | ICD-10-CM | POA: Diagnosis present

## 2015-11-05 LAB — COMPREHENSIVE METABOLIC PANEL
ALT: 15 U/L (ref 14–54)
ANION GAP: 9 (ref 5–15)
AST: 14 U/L — ABNORMAL LOW (ref 15–41)
Albumin: 3.5 g/dL (ref 3.5–5.0)
Alkaline Phosphatase: 61 U/L (ref 38–126)
BILIRUBIN TOTAL: 0.5 mg/dL (ref 0.3–1.2)
BUN: 13 mg/dL (ref 6–20)
CHLORIDE: 106 mmol/L (ref 101–111)
CO2: 24 mmol/L (ref 22–32)
Calcium: 8.7 mg/dL — ABNORMAL LOW (ref 8.9–10.3)
Creatinine, Ser: 0.67 mg/dL (ref 0.44–1.00)
Glucose, Bld: 108 mg/dL — ABNORMAL HIGH (ref 65–99)
POTASSIUM: 4.3 mmol/L (ref 3.5–5.1)
Sodium: 139 mmol/L (ref 135–145)
TOTAL PROTEIN: 6.6 g/dL (ref 6.5–8.1)

## 2015-11-05 LAB — CBC WITH DIFFERENTIAL/PLATELET
BASOS ABS: 0 10*3/uL (ref 0.0–0.1)
Basophils Relative: 0 %
EOS PCT: 2 %
Eosinophils Absolute: 0.1 10*3/uL (ref 0.0–0.7)
HEMATOCRIT: 38.3 % (ref 36.0–46.0)
Hemoglobin: 12.7 g/dL (ref 12.0–15.0)
LYMPHS ABS: 2.2 10*3/uL (ref 0.7–4.0)
Lymphocytes Relative: 31 %
MCH: 29.9 pg (ref 26.0–34.0)
MCHC: 33.2 g/dL (ref 30.0–36.0)
MCV: 90.1 fL (ref 78.0–100.0)
Monocytes Absolute: 0.6 10*3/uL (ref 0.1–1.0)
Monocytes Relative: 9 %
NEUTROS PCT: 58 %
Neutro Abs: 4.1 10*3/uL (ref 1.7–7.7)
PLATELETS: 263 10*3/uL (ref 150–400)
RBC: 4.25 MIL/uL (ref 3.87–5.11)
RDW: 13.1 % (ref 11.5–15.5)
WBC: 7 10*3/uL (ref 4.0–10.5)

## 2015-11-05 LAB — BASIC METABOLIC PANEL
ANION GAP: 9 (ref 5–15)
BUN: 14 mg/dL (ref 6–20)
CALCIUM: 8.7 mg/dL — AB (ref 8.9–10.3)
CHLORIDE: 105 mmol/L (ref 101–111)
CO2: 24 mmol/L (ref 22–32)
CREATININE: 0.65 mg/dL (ref 0.44–1.00)
GFR calc Af Amer: >60 mL/min (ref >60)
GFR calc non Af Amer: >60 mL/min (ref >60)
GLUCOSE: 118 mg/dL — AB (ref 65–99)
Potassium: 4.2 mmol/L (ref 3.5–5.1)
Sodium: 138 mmol/L (ref 135–145)

## 2015-11-05 LAB — TROPONIN I

## 2015-11-05 LAB — CBC
HCT: 38.8 % (ref 36.0–46.0)
HEMOGLOBIN: 12.7 g/dL (ref 12.0–15.0)
MCH: 29.1 pg (ref 26.0–34.0)
MCHC: 32.7 g/dL (ref 30.0–36.0)
MCV: 88.8 fL (ref 78.0–100.0)
Platelets: 256 10*3/uL (ref 150–400)
RBC: 4.37 MIL/uL (ref 3.87–5.11)
RDW: 12.9 % (ref 11.5–15.5)
WBC: 6.4 10*3/uL (ref 4.0–10.5)

## 2015-11-05 LAB — I-STAT TROPONIN, ED: TROPONIN I, POC: 0 ng/mL (ref 0.00–0.08)

## 2015-11-05 SURGERY — LEFT HEART CATH AND CORONARY ANGIOGRAPHY

## 2015-11-05 MED ORDER — ASPIRIN EC 81 MG PO TBEC
81.0000 mg | DELAYED_RELEASE_TABLET | Freq: Every day | ORAL | Status: DC
  Administered 2015-11-06 – 2015-11-10 (×4): 81 mg via ORAL
  Filled 2015-11-05 (×4): qty 1

## 2015-11-05 MED ORDER — ASPIRIN 81 MG PO CHEW
324.0000 mg | CHEWABLE_TABLET | Freq: Once | ORAL | Status: AC
  Administered 2015-11-05: 324 mg via ORAL
  Filled 2015-11-05: qty 4

## 2015-11-05 MED ORDER — LEVOTHYROXINE SODIUM 50 MCG PO TABS
50.0000 ug | ORAL_TABLET | Freq: Every day | ORAL | Status: DC
  Administered 2015-11-06 – 2015-11-10 (×5): 50 ug via ORAL
  Filled 2015-11-05 (×5): qty 1

## 2015-11-05 MED ORDER — CARBAMAZEPINE ER 100 MG PO TB12: 100.0000 mg | ORAL_TABLET | Freq: Every day | ORAL | Status: DC

## 2015-11-05 MED ORDER — SODIUM CHLORIDE 0.9% FLUSH: 3.0000 mL | INTRAVENOUS | Status: DC | PRN

## 2015-11-05 MED ORDER — SODIUM CHLORIDE 0.9% FLUSH
3.0000 mL | Freq: Two times a day (BID) | INTRAVENOUS | Status: DC
  Administered 2015-11-05 – 2015-11-07 (×6): 3 mL via INTRAVENOUS

## 2015-11-05 MED ORDER — ETHOSUXIMIDE 250 MG PO CAPS
500.0000 mg | ORAL_CAPSULE | Freq: Every day | ORAL | Status: DC
  Administered 2015-11-05 – 2015-11-06 (×2): 500 mg via ORAL
  Filled 2015-11-05 (×3): qty 2

## 2015-11-05 MED ORDER — ACETAMINOPHEN 500 MG PO TABS: 500.0000 mg | ORAL_TABLET | Freq: Every day | ORAL | Status: DC | PRN

## 2015-11-05 MED ORDER — ETHOSUXIMIDE 250 MG PO CAPS
500.0000 mg | ORAL_CAPSULE | Freq: Every day | ORAL | Status: DC
Start: 2015-11-05 — End: 2015-11-07
  Administered 2015-11-06: 500 mg via ORAL
  Filled 2015-11-05 (×3): qty 2

## 2015-11-05 MED ORDER — AMLODIPINE BESYLATE 10 MG PO TABS
10.0000 mg | ORAL_TABLET | Freq: Every day | ORAL | Status: DC
  Administered 2015-11-06 – 2015-11-10 (×4): 10 mg via ORAL
  Filled 2015-11-05 (×4): qty 1

## 2015-11-05 MED ORDER — ETHOSUXIMIDE 250 MG PO CAPS
250.0000 mg | ORAL_CAPSULE | Freq: Every day | ORAL | Status: DC
  Administered 2015-11-06 – 2015-11-07 (×2): 250 mg via ORAL
  Filled 2015-11-05 (×2): qty 1

## 2015-11-05 MED ORDER — SODIUM CHLORIDE 0.9 % IV SOLN
250.0000 mL | INTRAVENOUS | Status: DC | PRN
  Administered 2015-11-08: 250 mL via INTRAVENOUS

## 2015-11-05 MED ORDER — ISOSORBIDE MONONITRATE ER 30 MG PO TB24
30.0000 mg | ORAL_TABLET | Freq: Every day | ORAL | Status: DC
  Administered 2015-11-06 – 2015-11-10 (×4): 30 mg via ORAL
  Filled 2015-11-05 (×4): qty 1

## 2015-11-05 MED ORDER — CLOPIDOGREL BISULFATE 75 MG PO TABS
75.0000 mg | ORAL_TABLET | Freq: Every day | ORAL | Status: DC
  Administered 2015-11-06 – 2015-11-10 (×5): 75 mg via ORAL
  Filled 2015-11-05 (×5): qty 1

## 2015-11-05 MED ORDER — ACETAMINOPHEN 325 MG PO TABS
650.0000 mg | ORAL_TABLET | ORAL | Status: DC | PRN
  Administered 2015-11-07: 650 mg via ORAL
  Filled 2015-11-05 (×2): qty 2

## 2015-11-05 MED ORDER — EZETIMIBE 10 MG PO TABS
10.0000 mg | ORAL_TABLET | Freq: Every day | ORAL | Status: DC
  Administered 2015-11-06 – 2015-11-10 (×4): 10 mg via ORAL
  Filled 2015-11-05 (×4): qty 1

## 2015-11-05 MED ORDER — CARVEDILOL 12.5 MG PO TABS
12.5000 mg | ORAL_TABLET | Freq: Two times a day (BID) | ORAL | Status: DC
  Administered 2015-11-05 – 2015-11-08 (×6): 12.5 mg via ORAL
  Filled 2015-11-05 (×6): qty 1

## 2015-11-05 MED ORDER — NITROGLYCERIN 0.4 MG SL SUBL
0.4000 mg | SUBLINGUAL_TABLET | SUBLINGUAL | Status: DC | PRN
  Administered 2015-11-05 (×2): 0.4 mg via SUBLINGUAL
  Filled 2015-11-05: qty 1

## 2015-11-05 MED ORDER — ONDANSETRON HCL 4 MG/2ML IJ SOLN: 4.0000 mg | Freq: Four times a day (QID) | INTRAMUSCULAR | Status: DC | PRN

## 2015-11-05 MED ORDER — DULOXETINE HCL 60 MG PO CPEP
60.0000 mg | ORAL_CAPSULE | Freq: Every morning | ORAL | Status: DC
  Administered 2015-11-06 – 2015-11-10 (×4): 60 mg via ORAL
  Filled 2015-11-05 (×4): qty 1

## 2015-11-05 MED ORDER — CARBAMAZEPINE ER 100 MG PO TB12
100.0000 mg | ORAL_TABLET | Freq: Two times a day (BID) | ORAL | Status: DC
  Administered 2015-11-05 – 2015-11-07 (×4): 100 mg via ORAL
  Filled 2015-11-05 (×4): qty 1

## 2015-11-05 NOTE — ED Notes (Signed)
Cardiac PA at bedside. 

## 2015-11-05 NOTE — ED Notes (Signed)
Dr. Billy Fischer MD at bedside.

## 2015-11-05 NOTE — Telephone Encounter (Signed)
Patient's husband is calling to cancel patient's appt for lab on Monday and possibly Dr. Calton Dach appt the following week because patient is in patient at [facility] on the cardiac unit.  She is scheduled for a "full artery stent" next week.  They will call to reschedule when patient is home and feeling better.

## 2015-11-05 NOTE — ED Notes (Signed)
Spoke to cardiology about pt having episode that she describes as how she has been feeling with nausea, diaphoresis and "feeling flushed"; pt noted to be hypertensive and EKG obtained; hypotension resolved without intervention

## 2015-11-05 NOTE — Telephone Encounter (Signed)
I have cancelled her appt Tell her to call back when she is better and ready

## 2015-11-05 NOTE — Progress Notes (Deleted)
Cardiology Office Note   Date:  11/05/2015   ID:  Rachel Clark, DOB 12-26-1950, MRN [id number]  PCP:  Rachel Schwalbe, MD  Cardiologist:  Rachel Clark     History of Present Illness: Rachel Clark is a 65 y.o. female with a hx of CAD with remote NSTEMI with PCI of the RCA, s/p CABG in 2007, ASA intolerance due to GI upset, HL with statin intolerance, colon CA s/p resection and chemotherapy, MGUS.  She was seen in August 2016 with recurrent chest pain. Cardiac cath showed patent LIMA to the LAD. There was a 70% stenosis in the first diagonal. The native RCA demonstrated diffuse severe disease and the graft to the native RCA was occluded in the mid graft. She was treated medically. She also had syncope with telemetry demonstrating 15 seconds of high degree AV block. She underwent PPM placement by Rachel Clark.  In January 2017 she was readmitted with chest pain. Echo showed Normal LV function with EF 60-65%. Mild MR. Myoview study showed a fixed anterior scar and a moderate reversible inferior defect. Cardiac cath repeated and was unchanged. Medications were adjusted and she was DC.     Recent Labs: 03/24/2015: TSH 0.496 03/25/2015: ALT 15; LDL Cholesterol 164* 07/29/2015: BUN 7; Creatinine, Ser 0.56; Hemoglobin 12.1; Potassium 4.2; Sodium 139   CrCl cannot be calculated (Unknown ideal weight.).    Recent Radiology: No results found.    Wt Readings from Last 3 Encounters:  08/25/15 88.361 kg (194 lb 12.8 oz)  08/16/15 87.635 kg (193 lb 3.2 oz)  08/10/15 87.317 kg (192 lb 8 oz)     Past Medical History  Diagnosis Date  . Coronary artery disease     a. remote NSTEMI with PCI to the RCA; b. S/P CABG in 2007; c. 2010 - 2 v CAD with L-LAD and S-RCA patent, small caliber Dx 70% (treated medically - no amenable to PCI), CFX free of significant disease, normal LVF. d. 10/2014 Lexi MV: EF 61%, no ischemia/infarct.  . Dyslipidemia   . Monoclonal gammopathy of unknown significance    . Colon cancer ([place]) 12/04/2011    s/p Laparoscopic-assisted transverse colectomy on 12/19/2011 by Rachel Clark.  pT3 N0 M0.   Marland Kitchen Hyperlipidemia   . Fibromyalgia   . GERD (gastroesophageal reflux disease)   . Gastric ulcer   . Hypothyroid   . Depression   . Iron deficiency anemia 11/17/2011  . 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 Myocardial infarction ([place])   . Unstable angina ([place])   . Syncope and collapse     pacemaker implanted    Current Outpatient Prescriptions  Medication Sig Dispense Refill  . acetaminophen (TYLENOL) 500 MG tablet Take 500-1,000 mg by mouth daily as needed for mild pain or headache.    Marland Kitchen amLODipine (NORVASC) 5 MG tablet Take 1 tablet (5 mg total) by mouth daily. KEEP OV. 90 tablet 0  . Azelastine HCl 0.15 % SOLN Place 1 spray into the nose as needed (allergies).   12  . carbamazepine (TEGRETOL XR) 100 MG 12 hr tablet TAKE FIVE TABLETS BY MOUTH TWICE DAILY 900 tablet 2  . carvedilol (COREG) 12.5 MG tablet Take 1 tablet (12.5 mg total) by mouth 2 (two) times daily with a meal. 60 tablet 5  . clopidogrel (PLAVIX) 75 MG tablet Take 1 tablet (75 mg total) by mouth daily. KEEP OV. 90 tablet 0  .  DULoxetine (CYMBALTA) 60 MG capsule Take 60 mg by mouth every morning.    Marland Kitchen ethosuximide (ZARONTIN) 250 MG capsule Take 1 capsule (250mg ) in the morning, 2 capsules (500mg ) in the afternoon, and 2 capsules (500mg ) at night 150 capsule 0  . ezetimibe (ZETIA) 10 MG tablet Take 1 tablet (10 mg total) by mouth daily. KEEP OV. 90 tablet 0  . isosorbide mononitrate (IMDUR) 30 MG 24 hr tablet Take 0.5 tablet (15mg ) by mouth for 2 days then increase to 1 tablet (30mg ) daily 90 tablet 0  . levothyroxine (SYNTHROID, LEVOTHROID) 50 MCG tablet Take 50 mcg by mouth daily.     Marland Kitchen MINIVELLE 0.05 MG/24HR patch Apply 1 patch topically 2 (two) times a week. Monday and Thursday  12  . nitroGLYCERIN (NITROSTAT) 0.4 MG SL tablet Place  1 tablet (0.4 mg total) under the tongue every 5 (five) minutes as needed. For chest pain. 25 tablet 6  . tiZANidine (ZANAFLEX) 2 MG tablet Take 2-4 tablets by mouth at bedtime.   2   No current facility-administered medications for this visit.     Allergies:   Aspirin; Erythromycin; Lisinopril; Niacin and related; Penicillins; Statins; Sulfa drugs cross reactors; and Tetracyclines & related   Social History:  The patient  reports that she quit smoking about 34 years ago. Her smoking use included Cigarettes. She has a 6 pack-year smoking history. She has never used smokeless tobacco. She reports that she drinks about 0.6 oz of alcohol per week. She reports that she does not use illicit drugs.   Family History:  The patient's family history includes Cancer in her mother; Heart attack in her father; Heart attack (age of onset: 30) in her brother; Heart disease in her father. There is no history of Stroke.    ROS:  Please see the history of present illness.       All other systems reviewed and negative.    PHYSICAL EXAM: VS:  There were no vitals taken for this visit. Well nourished, well developed, in no acute distress HEENT: normal Neck: no JVD Carotids:  No bruits bilat Cardiac:  normal S1, S2;  RRR;  no murmur   Lungs:   clear to auscultation bilaterally, no wheezing, rhonchi or rales Abd: soft, nontender, no hepatomegaly Ext:  no edema Skin: warm and dry Neuro:  CNs 2-12 intact, no focal abnormalities noted  EKG:    Lab Results  Component Value Date   WBC 7.3 07/29/2015   HGB 12.1 07/29/2015   HCT 37.4 07/29/2015   PLT 265 07/29/2015   GLUCOSE 97 07/29/2015   CHOL 248* 03/25/2015   TRIG 94 03/25/2015   HDL 65 03/25/2015   LDLCALC 164* 03/25/2015   ALT 15 03/25/2015   AST 13* 03/25/2015   NA 139 07/29/2015   K 4.2 07/29/2015   CL 108 07/29/2015   CREATININE 0.56 07/29/2015   BUN 7 07/29/2015   CO2 25 07/29/2015   TSH 0.496 03/24/2015   INR 1.19 07/28/2015    HGBA1C 5.9* 03/24/2015      ASSESSMENT AND PLAN:   1.  CAD s/p CABG:  No angina.  She is on Effient for antiplatelet.  She is intol to statin.    2.  Hyperlipidemia:  She is intol to statins.  03/25/2015: ALT 15; HDL 65; LDL Cholesterol 164*    May be willing to try Livalo again. Does not want to try a PCSK9 inhibitor.    Disposition:   FU with Rachel Clark  1 year.    Signed, Rachel Clendenen [name] MD, [facility]    11/05/2015 7:19 AM    [address], [place], [place]  [address] Phone: [phone]; Fax: [phone]

## 2015-11-05 NOTE — H&P (Signed)
CARDIOLOGY HISTORY AND PHYSICAL   Patient ID: BOB CHHAY MRN: [id number] DOB/AGE: 67-Jul-1952 67 y.o.  Admit date: 11/05/2015  Primary Physician   Vidal Schwalbe, MD Primary Cardiologist : Dr. [name]  Requesting MD:  Reason for Consultation: Chest pain   HPI: Ms. Aakre is a 65 year old female with a past medical history of CAD s/p CABG in 2007, HLD, colon CA,s/p MDT PPM placed in 03/2015 for CHB.    She presents to the ED today with complaints of crushing chest pain that began while she was lying in bed this morning. Pain was located in the center of her chest, no radiation. Pain was not worse with inspiration.  It lasted for about a minute then she took a SL Nitro. Denies associated nausea, SOB or diaphoresis.  However, patient reports that she has felt a hot flushing sensation in her BLE spreading up to her head for the past 3 days. She says this is how she felt with her previous MI's.   She has a history of CAD, her last cath was in January 2017 (report below). She had significant disease throughout her RCA, no PCI was done at that time, medical therapy was reccommended.  She is on CCB, BB, long acting nitrate at home. She reports high compliance with her meds.   Her last Echo was in January of 2017, EF was normal, no wall motion abnormalities, however cath showed evidence for a focal region of mid to basal severe hypokinesis.   EKG today shows slight ST depression in V4-V6, this is minimal and noted in previous EKG's.  Troponin is negative x1.   Past Medical History  Diagnosis Date  . Coronary artery disease     a. remote NSTEMI with PCI to the RCA; b. S/P CABG in 2007; c. 2010 - 2 v CAD with L-LAD and S-RCA patent, small caliber Dx 70% (treated medically - no amenable to PCI), CFX free of significant disease, normal LVF. d. 10/2014 Lexi MV: EF 61%, no ischemia/infarct.  . Dyslipidemia   . Monoclonal gammopathy of unknown significance   . Colon cancer ([place]) 12/04/2011   s/p Laparoscopic-assisted transverse colectomy on 12/19/2011 by Dr. Donne Hazel.  pT3 N0 M0.   Marland Kitchen Hyperlipidemia   . Fibromyalgia   . GERD (gastroesophageal reflux disease)   . Gastric ulcer   . Hypothyroid   . Depression   . Iron deficiency anemia 11/17/2011  . 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 Myocardial infarction ([place])   . Unstable angina ([place])   . Syncope and collapse     pacemaker implanted     Past Surgical History  Procedure Laterality Date  . Posterior laminectomy / decompression cervical spine      20+yrs ago  . Colonoscopy    . Colon resection  12/19/2011    Procedure: COLON RESECTION LAPAROSCOPIC;  Surgeon: Rolm Bookbinder, MD;  Location: [facility];  Service: General;  Laterality: N/A;  laparoscopic hand assisted partial colon resection  . Portacath placement  06/04/2012    Procedure: INSERTION PORT-A-CATH;  Surgeon: Rolm Bookbinder, MD;  Location: [facility];  Service: General;  Laterality: N/A;  Insertion of port-a-cath   . Port-a-cath removal N/A 06/12/2013    Procedure: REMOVAL PORT-A-CATH;  Surgeon: Rolm Bookbinder, MD;  Location: [facility];  Service: General;  Laterality: N/A;  . Colon surgery    . Anterior cervical decomp/discectomy fusion  1980's  . Back surgery    . Vaginal hysterectomy    . Dilation and curettage of uterus  1973  . Coronary artery bypass graft  2007    x 2  . Cardiac catheterization  05/20/2009    obstructive native vessel disease in LAD, RCA, and first diagonal, patent vein graft to distal RCA and LIMA to LAD,normal. ef 60%  . Coronary angioplasty with stent placement  ~ 2007    1 stent  . Cardiac catheterization N/A 03/24/2015    Procedure: Left Heart Cath and Cors/Grafts Angiography;  Surgeon: Wellington Hampshire, MD;  Location: [facility];  Service: Cardiovascular;  Laterality: N/A;  . Ep implantable device N/A 03/25/2015    MDT Advisa DR pacemaker  implanted by Dr Rayann Heman for transient complete heart block and syncope  . Cardiac catheterization N/A 07/28/2015    Procedure: Left Heart Cath and Coronary Angiography;  Surgeon: Troy Sine, MD;  Location: [facility];  Service: Cardiovascular;  Laterality: N/A;    Allergies  Allergen Reactions  . Aspirin Other (See Comments)    GI upset  . Erythromycin Nausea Only    Gi upset  . Lisinopril     cough  . Niacin And Related     Pt does not recall reactions   . Penicillins Nausea Only    Has patient had a PCN reaction causing immediate rash, facial/tongue/throat swelling, SOB or lightheadedness with hypotension: Yes Has patient had a PCN reaction causing severe rash involving mucus membranes or skin necrosis: No Has patient had a PCN reaction that required hospitalization No Has patient had a PCN reaction occurring within the last 10 years: No If all of the above answers are "NO", then may proceed with Cephalosporin use.   . Statins     Muscle pain  . Sulfa Drugs Cross Reactors Swelling  . Tetracyclines & Related Nausea Only    I have reviewed the patient's current medications     Prior to Admission medications   Medication Sig Start Date End Date Taking? Authorizing Provider  acetaminophen (TYLENOL) 500 MG tablet Take 500-1,000 mg by mouth daily as needed for mild pain or headache.    Historical Provider, MD  amLODipine (NORVASC) 5 MG tablet Take 1 tablet (5 mg total) by mouth daily. KEEP OV. 10/28/15   Peter M [name], MD  Azelastine HCl 0.15 % SOLN Place 1 spray into the nose as needed (allergies).  12/22/14   Historical Provider, MD  carbamazepine (TEGRETOL XR) 100 MG 12 hr tablet TAKE FIVE TABLETS BY MOUTH TWICE DAILY 05/19/15   Kathrynn Ducking, MD  carvedilol (COREG) 12.5 MG tablet Take 1 tablet (12.5 mg total) by mouth 2 (two) times daily with a meal. 10/25/15   Peter M [name], MD  clopidogrel (PLAVIX) 75 MG tablet Take 1 tablet (75 mg total) by mouth daily. KEEP OV.  10/28/15   Peter M [name], MD  DULoxetine (CYMBALTA) 60 MG capsule Take 60 mg by mouth every morning.    Historical Provider, MD  ethosuximide (ZARONTIN) 250 MG capsule Take 1 capsule (250mg ) in the morning, 2 capsules (500mg ) in the afternoon, and 2 capsules (500mg ) at night 05/15/15   Penni Bombard, MD  ezetimibe (ZETIA) 10 MG tablet Take 1 tablet (10 mg total) by mouth daily. KEEP OV. 10/28/15   Peter M [name], MD  isosorbide mononitrate (IMDUR) 30 MG 24 hr tablet Take 0.5 tablet (15mg ) by mouth for 2 days then increase to  1 tablet (30mg ) daily 10/28/15   Brett Canales, PA-C  levothyroxine (SYNTHROID, LEVOTHROID) 50 MCG tablet Take 50 mcg by mouth daily.     Historical Provider, MD  MINIVELLE 0.05 MG/24HR patch Apply 1 patch topically 2 (two) times a week. Monday and Thursday 12/08/14   Historical Provider, MD  nitroGLYCERIN (NITROSTAT) 0.4 MG SL tablet Place 1 tablet (0.4 mg total) under the tongue every 5 (five) minutes as needed. For chest pain. 04/05/15   Burtis Junes, NP  tiZANidine (ZANAFLEX) 2 MG tablet Take 2-4 tablets by mouth at bedtime.  01/06/15   Historical Provider, MD     Social History   Social History  . Marital Status: Married    Spouse Name: N/A  . Number of Children: 2  . Years of Education: 12   Occupational History  . office work     unemployed   Social History Main Topics  . Smoking status: Former Smoker -- 0.30 packs/day for 20 years    Types: Cigarettes    Quit date: 06/06/1981  . Smokeless tobacco: Never Used  . Alcohol Use: 0.6 oz/week    1 Glasses of wine per week  . Drug Use: No  . Sexual Activity: Yes    Birth Control/ Protection: Surgical   Other Topics Concern  . Not on file   Social History Narrative   Patient is married with 2 children.   Patient is right handed.   Patient has a high school education with some college education.   Patient drinks 2 cups daily.    Family Status  Relation Status Death Age  . Father Deceased 62  . Brother  Alive   . Mother Deceased     lung cancer  . Sister Alive   . Brother Alive   . Maternal Grandmother Deceased   . Maternal Grandfather Deceased   . Paternal Grandmother Deceased   . Paternal Grandfather Deceased    Family History  Problem Relation Age of Onset  . Heart attack Father   . Heart disease Father   . Heart attack Brother 69  . Cancer Mother     lung  . Stroke Neg Hx      ROS:  Full 14 point review of systems complete and found to be negative unless listed above.  Physical Exam: Blood pressure 108/62, pulse 64, temperature 97.9 F (36.6 C), temperature source Oral, resp. rate 14, height 5\' 8"  (1.727 m), weight 190 lb (86.183 kg), SpO2 97 %.  General: Well developed, well nourished, female in no acute distress Head: Eyes PERRLA, No xanthomas.   Normocephalic and atraumatic, oropharynx without edema or exudate. Dentition: Good Lungs: CTA Heart: HRRR S1 S2, no rub/gallop, No murmur.Pulses are 2+ extrem.   Neck: carotid bruit on right side . No lymphadenopathy.  No JVD. Abdomen: Bowel sounds present, abdomen soft and non-tender without masses or hernias noted. Msk:  No spine or cva tenderness. No weakness, no joint deformities or effusions. Extremities: No clubbing or cyanosis.  No edema.  Neuro: Alert and oriented X 3. No focal deficits noted. Psych:  Good affect, responds appropriately Skin: No rashes or lesions noted.  Labs:   Lab Results  Component Value Date   WBC 7.3 07/29/2015   HGB 12.1 07/29/2015   HCT 37.4 07/29/2015   MCV 88.0 07/29/2015   PLT 265 07/29/2015    Echo 07/26/15: Study Conclusions  - Left ventricle: The cavity size was normal. There was mild  concentric hypertrophy. Systolic function  was normal. The  estimated ejection fraction was in the range of 60% to 65%. Wall  motion was normal; there were no regional wall motion  abnormalities. Left ventricular diastolic function parameters  were normal. - Mitral valve: There was mild  regurgitation. - Atrial septum: No defect or patent foramen ovale was identified  bu color flow Doppler. - Tricuspid valve: There was trivial regurgitation. - Inferior vena cava: The vessel was normal in size. The  respirophasic diameter changes were in the normal range (>= 50%),  consistent with normal central venous pressure. - Global longitudinal strain -18.6%.  ECG: NSR   Left Heart Cath and Coronary Angiography     Ost RCA to Prox RCA lesion, 90% stenosed. The lesion was previously treated with a stent (unknown type).  Prox RCA lesion, 99% stenosed.  Mid RCA lesion, 95% stenosed.  Dist RCA lesion, 80% stenosed.  SVG was injected is small.  There is severe disease in the graft.  Mid Graft lesion, 100% stenosed.  Ramus lesion, 20% stenosed.  Ost LAD to Mid LAD lesion, 50% stenosed.  1st Diag lesion, 70% stenosed.  Dist LAD lesion, 100% stenosed.  LIMA was injected is normal in caliber, and is anatomically normal.  Preserved global LV contractility with an ejection fraction of 55% but evidence for a focal region of mid to basal severe hypokinesis to akinesis.  Native CAD with a normal long left main coronary artery; diffuse narrowing with calcification and stenosis of 50% in the proximal LAD with diffuse 70% first agonal stenosis and probable mid LAD occlusion; 20% proximal stenosis in a large ramus intermediate vessel; a AV groove circumflex vessel with diffuse luminal irregularity and narrowing; and native RCA with a previously placed stent proximally with diffuse proximal 90% stenoses with 99% stenosis focally at the distal aspect of the stent, 95% mid RCA stenosis before the acute margin and 80% RCA stenosis and a small distal vessel after the vein graft anastamosis with evidence for left to right collateralization to the PDA/PL   Patent LIMA graft supplying the mid LAD.  Atretic vein graft to the RCA with total occlusion in the mid graft which does not reach  the distal RCA.  RECOMMENDATION: Patient has been on minimal anti-ischemic medications and has developed increasing symptomatology. Initially, an increased medical regimen will be recommended with beta blocker therapy, amlodipine, and Ranexa. Apparently, she is intolerant to nitrates. If she develops continued symptomatology, consider intervention to the native RCA, which now has antegrade flow in the vessel and is visualized to its distal segment.     ASSESSMENT AND PLAN:    Active Problems:   Coronary artery disease, post CABG 2007    Dyslipidemia   Unstable angina ([place])  1. Chest pain: Patient was awakened from sleep with crushing chest pain. She has a history of CAD s/p CABG in 2007. Her last cath revealed diffuse disease throughout her RCA. She is currently on Plavix. MD to advise on heart cath with consideration of PCI to her CTO. Her EKG is not concerning for ischemia today. Troponin will be cycled. Will give 324mg  ASA now. We will admit .  2. Dyslipidemia: last LDL was 164. She is intolerant of statins. On Zeta currently. She was seen in the lipid clinic and started on Livalo, but she didn't feel well so she stopped taking it.   3. HTN: Well controlled on CCB and BB.   4. Right carotid bruit: Patient had carotid dopplers in 09/2015. No significant stenosis.  Signed: Arbutus Leas, NP 11/05/2015 9:14 AM Pager [phone]  Co-Sign MD  Patient seen, examined. Available data reviewed. Agree with findings, assessment, and plan as outlined by Jettie Booze. Cath films from last 3 studies reviewed. Pt with unstable angina. Discussed her case at length with Dr [name] and other members of the interventional team. She is post-CABG with severe diffuse CAD. Only potential target is native RCA which is small, calcified, and diffusely diseased. She is on 3 antianginal drugs. Exam as outlined above with clear lungs, CV RRR without murmur, and no edema. EKG without acute changes and initial  troponin negative.   Plan: overnight obs, cycle enzymes, increase amlodipine to 10 mg and increase imdur to 60 mg. If stable ok for discharge tomorrow am. Bring back Tuesday for complex PCI. If recurrent CP at rest will need to start heparin and keep in hospital.  Sherren Mocha, M.D. 11/05/2015 10:28 AM

## 2015-11-05 NOTE — ED Provider Notes (Signed)
CSN: [id number]     Arrival date & time 11/05/15  [id number] History   First MD Initiated Contact with Patient 11/05/15 [phone]     Chief Complaint  Patient presents with  . Chest Pain     (Consider location/radiation/quality/duration/timing/severity/associated sxs/prior Treatment) Patient is a 65 y.o. female presenting with chest pain.  Chest Pain Pain location:  Substernal area Pain quality: crushing   Pain quality comment:  Heaviness Pain radiates to:  Does not radiate Pain severity now: this AM was a 8/10. Duration: took nitro at 740AM. Relieved by:  Nitroglycerin Associated symptoms: no abdominal pain, no altered mental status, no back pain, no cough, no diaphoresis (flushing), no fever, no headache, no nausea, no shortness of breath and not vomiting   Risk factors: coronary artery disease and high cholesterol     Past Medical History  Diagnosis Date  . Coronary artery disease     a. remote NSTEMI with PCI to the RCA; b. S/P CABG in 2007; c. 2010 - 2 v CAD with L-LAD and S-RCA patent, small caliber Dx 70% (treated medically - no amenable to PCI), CFX free of significant disease, normal LVF. d. 10/2014 Lexi MV: EF 61%, no ischemia/infarct.  . Dyslipidemia   . Monoclonal gammopathy of unknown significance   . Colon cancer ([place]) 12/04/2011    s/p Laparoscopic-assisted transverse colectomy on 12/19/2011 by Dr. Donne Hazel.  pT3 N0 M0.   Marland Kitchen Hyperlipidemia   . Fibromyalgia   . GERD (gastroesophageal reflux disease)   . Gastric ulcer   . Hypothyroid   . Depression   . Iron deficiency anemia 11/17/2011  . Grand mal epilepsy, controlled ([place]) 12/06/11    last seizure was in 1972 ;takes Tegretol (10/26/2014)   . Myocardial infarction ([place])   . Unstable angina ([place])   . Syncope and collapse     pacemaker implanted   Past Surgical History  Procedure Laterality Date  . Posterior laminectomy / decompression cervical spine      20+yrs ago  . Colonoscopy    . Colon resection  12/19/2011     Procedure: COLON RESECTION LAPAROSCOPIC;  Surgeon: Rolm Bookbinder, MD;  Location: [place];  Service: General;  Laterality: N/A;  laparoscopic hand assisted partial colon resection  . Portacath placement  06/04/2012    Procedure: INSERTION PORT-A-CATH;  Surgeon: Rolm Bookbinder, MD;  Location: [place];  Service: General;  Laterality: N/A;  Insertion of port-a-cath   . Port-a-cath removal N/A 06/12/2013    Procedure: REMOVAL PORT-A-CATH;  Surgeon: Rolm Bookbinder, MD;  Location: [facility];  Service: General;  Laterality: N/A;  . Colon surgery    . Anterior cervical decomp/discectomy fusion  1980's  . Back surgery    . Vaginal hysterectomy    . Dilation and curettage of uterus  1973  . Coronary artery bypass graft  2007    x 2  . Cardiac catheterization  05/20/2009    obstructive native vessel disease in LAD, RCA, and first diagonal, patent vein graft to distal RCA and LIMA to LAD,normal. ef 60%  . Coronary angioplasty with stent placement  ~ 2007    1 stent  . Cardiac catheterization N/A 03/24/2015    Procedure: Left Heart Cath and Cors/Grafts Angiography;  Surgeon: Wellington Hampshire, MD;  Location: [facility];  Service: Cardiovascular;  Laterality: N/A;  . Ep implantable device N/A 03/25/2015    MDT Advisa DR pacemaker implanted by Dr Rayann Heman for transient complete heart block and syncope  .  Cardiac catheterization N/A 07/28/2015    Procedure: Left Heart Cath and Coronary Angiography;  Surgeon: Troy Sine, MD;  Location: [facility];  Service: Cardiovascular;  Laterality: N/A;   Family History  Problem Relation Age of Onset  . Heart attack Father   . Heart disease Father   . Heart attack Brother 69  . Cancer Mother     lung  . Stroke Neg Hx    Social History  Substance Use Topics  . Smoking status: Former Smoker -- 0.30 packs/day for 20 years    Types: Cigarettes    Quit date: 06/06/1981  . Smokeless tobacco: Never Used  . Alcohol Use: 0.6 oz/week     1 Glasses of wine per week   OB History    No data available     Review of Systems  Constitutional: Negative for fever and diaphoresis (flushing).  HENT: Negative for sore throat.   Eyes: Negative for visual disturbance.  Respiratory: Negative for cough and shortness of breath.   Cardiovascular: Positive for chest pain.  Gastrointestinal: Negative for nausea, vomiting and abdominal pain.  Genitourinary: Negative for difficulty urinating.  Musculoskeletal: Negative for back pain and neck pain.  Skin: Negative for rash.  Neurological: Negative for syncope and headaches.      Allergies  Aspirin; Erythromycin; Lisinopril; Niacin and related; Statins; Sulfa drugs cross reactors; Tetracyclines & related; and Penicillins  Home Medications   Prior to Admission medications   Medication Sig Start Date End Date Taking? Authorizing Provider  acetaminophen (TYLENOL) 500 MG tablet Take 500-1,000 mg by mouth daily as needed for mild pain or headache.   Yes Historical Provider, MD  amLODipine (NORVASC) 5 MG tablet Take 1 tablet (5 mg total) by mouth daily. KEEP OV. 10/28/15  Yes Peter M [name], MD  Azelastine HCl 0.15 % SOLN Place 1 spray into both nostrils daily as needed (seasonal allergies).  12/22/14  Yes Historical Provider, MD  carbamazepine (TEGRETOL XR) 100 MG 12 hr tablet TAKE FIVE TABLETS BY MOUTH TWICE DAILY Patient taking differently: Take 500 mg by mouth 2 (two) times daily.  05/19/15  Yes Kathrynn Ducking, MD  carvedilol (COREG) 12.5 MG tablet Take 1 tablet (12.5 mg total) by mouth 2 (two) times daily with a meal. 10/25/15  Yes Peter M [name], MD  clopidogrel (PLAVIX) 75 MG tablet Take 1 tablet (75 mg total) by mouth daily. KEEP OV. 10/28/15  Yes Peter M [name], MD  DULoxetine (CYMBALTA) 60 MG capsule Take 60 mg by mouth daily.    Yes Historical Provider, MD  estradiol (VIVELLE-DOT) 0.05 MG/24HR patch Place 1 patch onto the skin 2 (two) times a week. Sunday, Wednesday   Yes  Historical Provider, MD  ethosuximide (ZARONTIN) 250 MG capsule Take 1 capsule (250mg ) in the morning, 2 capsules (500mg ) in the afternoon, and 2 capsules (500mg ) at night Patient taking differently: Take 500 mg by mouth 2 (two) times daily.  05/15/15  Yes Penni Bombard, MD  ezetimibe (ZETIA) 10 MG tablet Take 1 tablet (10 mg total) by mouth daily. KEEP OV. 10/28/15  Yes Peter M [name], MD  isosorbide mononitrate (IMDUR) 30 MG 24 hr tablet Take 0.5 tablet (15mg ) by mouth for 2 days then increase to 1 tablet (30mg ) daily Patient taking differently: Take 30 mg by mouth daily.  10/28/15  Yes Brett Canales, PA-C  levothyroxine (SYNTHROID, LEVOTHROID) 50 MCG tablet Take 50 mcg by mouth daily.    Yes Historical Provider, MD  nitroGLYCERIN (  NITROSTAT) 0.4 MG SL tablet Place 1 tablet (0.4 mg total) under the tongue every 5 (five) minutes as needed. For chest pain. Patient taking differently: Place 0.4 mg under the tongue every 5 (five) minutes as needed for chest pain.  04/05/15  Yes Burtis Junes, NP  tiZANidine (ZANAFLEX) 2 MG tablet Take 4 tablets by mouth at bedtime.  01/06/15  Yes Historical Provider, MD   BP 142/59 mmHg  Pulse 67  Temp(Src) 97.9 F (36.6 C) (Oral)  Resp 17  Ht 5\' 8"  (1.727 m)  Wt 190 lb (86.183 kg)  BMI 28.90 kg/m2  SpO2 100% Physical Exam  Constitutional: She is oriented to person, place, and time. She appears well-developed and well-nourished. No distress.  HENT:  Head: Normocephalic and atraumatic.  Eyes: Conjunctivae and EOM are normal.  Neck: Normal range of motion.  Cardiovascular: Normal rate, regular rhythm, normal heart sounds and intact distal pulses.  Exam reveals no gallop and no friction rub.   No murmur heard. Pulmonary/Chest: Effort normal and breath sounds normal. No respiratory distress. She has no wheezes. She has no rales.  Abdominal: Soft. She exhibits no distension. There is no tenderness. There is no guarding.  Musculoskeletal: She exhibits no  edema or tenderness.  Neurological: She is alert and oriented to person, place, and time.  Skin: Skin is warm and dry. No rash noted. She is not diaphoretic. No erythema.  Nursing note and vitals reviewed.   ED Course  Procedures (including critical care time) Labs Review Labs Reviewed  BASIC METABOLIC PANEL - Abnormal; Notable for the following:    Glucose, Bld 118 (*)    Calcium 8.7 (*)    All other components within normal limits  COMPREHENSIVE METABOLIC PANEL - Abnormal; Notable for the following:    Glucose, Bld 108 (*)    Calcium 8.7 (*)    AST 14 (*)    All other components within normal limits  CBC  TROPONIN I  TROPONIN I  CBC WITH DIFFERENTIAL/PLATELET  TROPONIN I  LIPID PANEL  CBC  BASIC METABOLIC PANEL  I-STAT TROPOININ, ED    Imaging Review Dg Chest 2 View  11/05/2015  CLINICAL DATA:  Acute chest pain. EXAM: CHEST  2 VIEW COMPARISON:  July 25, 2015. FINDINGS: The heart size and mediastinal contours are within normal limits. Both lungs are clear. No pneumothorax or pleural effusion is noted. Status post coronary artery bypass graft. Left-sided pacemaker is unchanged in position. The visualized skeletal structures are unremarkable. IMPRESSION: No active cardiopulmonary disease. Electronically Signed   By: Marijo Conception, M.D.   On: 11/05/2015 09:17   I have personally reviewed and evaluated these images and lab results as part of my medical decision-making.   EKG Interpretation   Date/Time:  Friday Nov 05 2015 12:49:29 EDT Ventricular Rate:  64 PR Interval:  200 QRS Duration: 99 QT Interval:  397 QTC Calculation: 410 R Axis:   15 Text Interpretation:  Sinus rhythm Nonspecific T abnormalities, anterior  leads more prominent compared to previous; possible ischemia Confirmed by  CRENSHAW  MD, BRIAN ([id number]) on 11/05/2015 3:46:39 PM      MDM   Final diagnoses:  Chest pain with high risk for cardiac etiology    65 year old female with a history of  hyperlipidemia, stage III colon cancer with surgery and chemotherapy in 2013, seizures, coronary artery disease status post CABG, last catheterization in 2017, pacemaker in place, presents with concern for chest pain. EKG was evaluated by me and  showed no acute changes. Troponin negative.  No dyspnea, no hypoxia, no tachypnea and low suspicion for PE. No radiation of pain, no neuro symptoms, normal bilateral pulses and doubt aortic dissection.  Concern for cardiac etiology of pain given history of CAD, improvement with nitro. Given ASA. CP free at this time. Cardiology to admit for further care.  After admission while pt in ED, reported worsening CP, repeat ECG with dynamic changes and blood pressure decreased and cardiology team contacted.   Gareth Morgan, MD 11/05/15 [phone]

## 2015-11-05 NOTE — Progress Notes (Signed)
Patient ID: Rachel Clark, female   DOB: Mar 31, 1951, 65 y.o.   MRN: [id number]  Erroneous encounter  Teodoro Jeffreys [name] MD, [facility]

## 2015-11-05 NOTE — ED Notes (Signed)
Dr. Cooper at bedside 

## 2015-11-05 NOTE — Telephone Encounter (Signed)
Patient had h/o CAD s/p CABG, last cath Jan 2017, result below   Ost RCA to Prox RCA lesion, 90% stenosed. The lesion was previously treated with a stent (unknown type).  Prox RCA lesion, 99% stenosed.  Mid RCA lesion, 95% stenosed.  Dist RCA lesion, 80% stenosed.  SVG was injected is small.  There is severe disease in the graft.  Mid Graft lesion, 100% stenosed.  Ramus lesion, 20% stenosed.  Ost LAD to Mid LAD lesion, 50% stenosed.  1st Diag lesion, 70% stenosed.  Dist LAD lesion, 100% stenosed.  LIMA was injected is normal in caliber, and is anatomically normal.  Patient has been on minimal anti-ischemic medications and has developed increasing symptomatology. Initially, an increased medical regimen will be recommended with beta blocker therapy, amlodipine, and Ranexa. Apparently, she is intolerant to nitrates. If she develops continued symptomatology, consider intervention to the native RCA, which now has antegrade flow in the vessel and is visualized to its distal segment.   Per husband, she has been having increasing episodes of chest discomfort. This morning, the chest discomfort came back again. She has taken nitroglycerin at home times one, however symptom has not completely gone away. She has office appointment at 8:15 AM with Dr. [name]. Given his significant RCA disease, I have advised the patient to come to the [facility] ED for evaluation instead.  Hilbert Corrigan PA Pager: [phone]

## 2015-11-05 NOTE — Progress Notes (Addendum)
Dr. Colon Flattery page and made aware of patient having C.P and prgress note by Dr.Cooper. He will review chart and get back to me.

## 2015-11-05 NOTE — ED Notes (Signed)
Pt reports sudden onset "crushing" central chest pain in bed this morning, denies N/V, LOC, SOB.  Pt reports feeling of heat, flushing beginning in BLE spreading up to head x 3 days.  Pt reports this as previous cardiac symptom.  Pt reportts taking 1 NTG, denies pain at this time.  AOx4. NAD noted at this time.

## 2015-11-06 DIAGNOSIS — I200 Unstable angina: Secondary | ICD-10-CM | POA: Diagnosis not present

## 2015-11-06 LAB — LIPID PANEL
CHOL/HDL RATIO: 2.9 ratio
CHOLESTEROL: 232 mg/dL — AB (ref 0–200)
HDL: 80 mg/dL (ref >40)
LDL Cholesterol: 132 mg/dL — ABNORMAL HIGH (ref 0–99)
Triglycerides: 102 mg/dL (ref <150)
VLDL: 20 mg/dL (ref 0–40)

## 2015-11-06 LAB — BASIC METABOLIC PANEL
ANION GAP: 9 (ref 5–15)
BUN: 12 mg/dL (ref 6–20)
CALCIUM: 8.8 mg/dL — AB (ref 8.9–10.3)
CO2: 25 mmol/L (ref 22–32)
Chloride: 104 mmol/L (ref 101–111)
Creatinine, Ser: 0.64 mg/dL (ref 0.44–1.00)
GFR calc Af Amer: >60 mL/min (ref >60)
GFR calc non Af Amer: >60 mL/min (ref >60)
GLUCOSE: 113 mg/dL — AB (ref 65–99)
Potassium: 4 mmol/L (ref 3.5–5.1)
Sodium: 138 mmol/L (ref 135–145)

## 2015-11-06 LAB — HEPARIN LEVEL (UNFRACTIONATED)
HEPARIN UNFRACTIONATED: 0.42 [IU]/mL (ref 0.30–0.70)
Heparin Unfractionated: 0.55 IU/mL (ref 0.30–0.70)

## 2015-11-06 LAB — CBC
HEMATOCRIT: 38.6 % (ref 36.0–46.0)
Hemoglobin: 13 g/dL (ref 12.0–15.0)
MCH: 30.2 pg (ref 26.0–34.0)
MCHC: 33.7 g/dL (ref 30.0–36.0)
MCV: 89.6 fL (ref 78.0–100.0)
Platelets: 274 10*3/uL (ref 150–400)
RBC: 4.31 MIL/uL (ref 3.87–5.11)
RDW: 13 % (ref 11.5–15.5)
WBC: 8.6 10*3/uL (ref 4.0–10.5)

## 2015-11-06 MED ORDER — HEPARIN BOLUS VIA INFUSION
4000.0000 [IU] | Freq: Once | INTRAVENOUS | Status: AC
  Administered 2015-11-06: 4000 [IU] via INTRAVENOUS
  Filled 2015-11-06: qty 4000

## 2015-11-06 MED ORDER — HEPARIN (PORCINE) IN NACL 100-0.45 UNIT/ML-% IJ SOLN
1000.0000 [IU]/h | INTRAMUSCULAR | Status: DC
  Administered 2015-11-06 – 2015-11-08 (×3): 1000 [IU]/h via INTRAVENOUS
  Filled 2015-11-06 (×3): qty 250

## 2015-11-06 NOTE — Progress Notes (Signed)
[place] for heparin Indication: chest pain/ACS  Allergies  Allergen Reactions  . Aspirin Other (See Comments)    GI upset  . Erythromycin Nausea Only    Gi upset  . Lisinopril Cough  . Niacin And Related     Pt does not recall reactions   . Statins     Muscle pain  . Sulfa Drugs Cross Reactors Swelling  . Tetracyclines & Related Nausea Only  . Penicillins Nausea Only and Rash    Has patient had a PCN reaction causing immediate rash, facial/tongue/throat swelling, SOB or lightheadedness with hypotension: Yes Has patient had a PCN reaction causing severe rash involving mucus membranes or skin necrosis: No Has patient had a PCN reaction that required hospitalization No Has patient had a PCN reaction occurring within the last 10 years: No If all of the above answers are "NO", then may proceed with Cephalosporin use.     Patient Measurements: Height: 5\' 8"  (172.7 cm) Weight: 190 lb (86.183 kg) IBW/kg (Calculated) : 63.9 Heparin Dosing Weight: 81.8 kg  Vital Signs: Temp: 97.9 F (36.6 C) (05/06 2011) Temp Source: Oral (05/06 2011) BP: 158/95 mmHg (05/06 2011) Pulse Rate: 72 (05/06 2011)  Labs:  Recent Labs  11/05/15 0850 11/05/15 1507 11/05/15 2058 11/06/15 0223 11/06/15 1556 11/06/15 2210  HGB 12.7 12.7  --  13.0  --   --   HCT 38.8 38.3  --  38.6  --   --   PLT 256 263  --  274  --   --   HEPARINUNFRC  --   --   --   --  0.55 0.42  CREATININE 0.65 0.67  --  0.64  --   --   TROPONINI <0.03 <0.03 <0.03  --   --   --     Estimated Creatinine Clearance: 79.5 mL/min (by C-G formula based on Cr of 0.64).   Medications:  Infusions:  . heparin 1,000 Units/hr (11/06/15 1115)    Assessment: 65 yo female w/ hx of CAD s/p CABG in 2007 presented w/ chest pain on 5/5. To start heparin d/t continued chest pain. Plan for PCI attempt on Tuesday. CBC stable and wnl.  Confirmatory heparin level of 0.42 is within range. No issues  with infusion or sxs of bleeding per RN.  Goal of Therapy:  Heparin level 0.3-0.7 units/ml Monitor platelets by anticoagulation protocol: Yes   Plan:  1. Continue heparin infusion at 1000 units/hr 2. Check anti-Xa level daily while on heparin along with CBC 3. Continue to monitor H&H and platelets   Vincenza Hews, PharmD, BCPS 11/06/2015, 11:08 PM Pager: [phone]

## 2015-11-06 NOTE — Progress Notes (Signed)
Patient call and c/o of Chest pain "Burning in Chest " mid sternal and non rad. Skin warm and dry and no other complaints voice. 21:50 1 NTG  S.L. Given  o2 3 l/m n.c. applied 5 min. Later at 21:55 pain 6/10  Gave 2 nd NTG  S.L. 22:00 pain down to 1/10, 22:01 pain free. R.N. Aware and M.D. Paged

## 2015-11-06 NOTE — Progress Notes (Signed)
ANTICOAGULATION CONSULT NOTE - Initial Consult  Pharmacy Consult for heparin Indication: chest pain/ACS  Allergies  Allergen Reactions  . Aspirin Other (See Comments)    GI upset  . Erythromycin Nausea Only    Gi upset  . Lisinopril Cough  . Niacin And Related     Pt does not recall reactions   . Statins     Muscle pain  . Sulfa Drugs Cross Reactors Swelling  . Tetracyclines & Related Nausea Only  . Penicillins Nausea Only and Rash    Has patient had a PCN reaction causing immediate rash, facial/tongue/throat swelling, SOB or lightheadedness with hypotension: Yes Has patient had a PCN reaction causing severe rash involving mucus membranes or skin necrosis: No Has patient had a PCN reaction that required hospitalization No Has patient had a PCN reaction occurring within the last 10 years: No If all of the above answers are "NO", then may proceed with Cephalosporin use.     Patient Measurements: Height: 5\' 8"  (172.7 cm) Weight: 190 lb (86.183 kg) IBW/kg (Calculated) : 63.9 Heparin Dosing Weight: 81.8 kg  Vital Signs: Temp: 97.7 F (36.5 C) (05/06 0450) Temp Source: Oral (05/06 0450) BP: 139/60 mmHg (05/06 0450) Pulse Rate: 69 (05/06 0450)  Labs:  Recent Labs  11/05/15 0850 11/05/15 1507 11/05/15 2058 11/06/15 0223  HGB 12.7 12.7  --  13.0  HCT 38.8 38.3  --  38.6  PLT 256 263  --  274  CREATININE 0.65 0.67  --  0.64  TROPONINI <0.03 <0.03 <0.03  --     Estimated Creatinine Clearance: 79.5 mL/min (by C-G formula based on Cr of 0.64).   Medical History: Past Medical History  Diagnosis Date  . Coronary artery disease     a. remote NSTEMI with PCI to the RCA; b. S/P CABG in 2007; c. 2010 - 2 v CAD with L-LAD and S-RCA patent, small caliber Dx 70% (treated medically - no amenable to PCI), CFX free of significant disease, normal LVF. d. 10/2014 Lexi MV: EF 61%, no ischemia/infarct.  . Dyslipidemia   . Monoclonal gammopathy of unknown significance   . Colon  cancer ([place]) 12/04/2011    s/p Laparoscopic-assisted transverse colectomy on 12/19/2011 by Dr. Donne Hazel.  pT3 N0 M0.   Marland Kitchen Hyperlipidemia   . Fibromyalgia   . GERD (gastroesophageal reflux disease)   . Gastric ulcer   . Hypothyroid   . Depression   . Iron deficiency anemia 11/17/2011  . 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 Myocardial infarction ([place])   . Unstable angina ([place])   . Syncope and collapse     pacemaker implanted    Medications:  Infusions:    Assessment: 65 yo female w/ hx of CAD s/p CABG in 2007 presented w/ chest pain on 5/5. To start heparin d/t continued chest pain. Plan for PCI attempt on Tuesday. CBC stable and wnl.  Goal of Therapy:  Heparin level 0.3-0.7 units/ml Monitor platelets by anticoagulation protocol: Yes   Plan:  Give 4000 units bolus x 1 Start heparin infusion at 1000 units/hr Check anti-Xa level in 6 hours and daily while on heparin Continue to monitor H&H and platelets  Joya San, PharmD Clinical Pharmacy Resident Pager # [phone] 11/06/2015 9:58 AM

## 2015-11-06 NOTE — Progress Notes (Signed)
[place] for heparin Indication: chest pain/ACS  Allergies  Allergen Reactions  . Aspirin Other (See Comments)    GI upset  . Erythromycin Nausea Only    Gi upset  . Lisinopril Cough  . Niacin And Related     Pt does not recall reactions   . Statins     Muscle pain  . Sulfa Drugs Cross Reactors Swelling  . Tetracyclines & Related Nausea Only  . Penicillins Nausea Only and Rash    Has patient had a PCN reaction causing immediate rash, facial/tongue/throat swelling, SOB or lightheadedness with hypotension: Yes Has patient had a PCN reaction causing severe rash involving mucus membranes or skin necrosis: No Has patient had a PCN reaction that required hospitalization No Has patient had a PCN reaction occurring within the last 10 years: No If all of the above answers are "NO", then may proceed with Cephalosporin use.     Patient Measurements: Height: 5\' 8"  (172.7 cm) Weight: 190 lb (86.183 kg) IBW/kg (Calculated) : 63.9 Heparin Dosing Weight: 81.8 kg  Vital Signs: Temp: 97.4 F (36.3 C) (05/06 1333) Temp Source: Oral (05/06 1333) BP: 112/58 mmHg (05/06 1333) Pulse Rate: 75 (05/06 1333)  Labs:  Recent Labs  11/05/15 0850 11/05/15 1507 11/05/15 2058 11/06/15 0223 11/06/15 1556  HGB 12.7 12.7  --  13.0  --   HCT 38.8 38.3  --  38.6  --   PLT 256 263  --  274  --   HEPARINUNFRC  --   --   --   --  0.55  CREATININE 0.65 0.67  --  0.64  --   TROPONINI <0.03 <0.03 <0.03  --   --     Estimated Creatinine Clearance: 79.5 mL/min (by C-G formula based on Cr of 0.64).   Medications:  Infusions:  . heparin 1,000 Units/hr (11/06/15 1115)    Assessment: 65 yo female w/ hx of CAD s/p CABG in 2007 presented w/ chest pain on 5/5. To start heparin d/t continued chest pain. Plan for PCI attempt on Tuesday. CBC stable and wnl.  Initial heparin level is within range at 0.55. No bleeding noted, continue current rate and recheck  confirmatory level tonight.  Goal of Therapy:  Heparin level 0.3-0.7 units/ml Monitor platelets by anticoagulation protocol: Yes   Plan:  Continue heparin infusion at 1000 units/hr Check confirmatory anti-Xa level in 6 hours and daily while on heparin Continue to monitor H&H and platelets  Erin Hearing PharmD., BCPS Clinical Pharmacist Pager [phone] 11/06/2015 5:27 PM

## 2015-11-06 NOTE — Progress Notes (Signed)
    Subjective:  More CP early AM relieved with SLNTG; no dyspnea   Objective:  Filed Vitals:   11/05/15 2056 11/05/15 2155 11/05/15 2200 11/06/15 0450  BP: 147/61 94/45 112/54 139/60  Pulse: 62 66 68 69  Temp: 97.4 F (36.3 C)   97.7 F (36.5 C)  TempSrc: Oral   Oral  Resp: 17 16 16 17   Height:      Weight:      SpO2: 95% 96% 99% 99%    Intake/Output from previous day:  Intake/Output Summary (Last 24 hours) at 11/06/15 0915 Last data filed at 11/06/15 0654  Gross per 24 hour  Intake    480 ml  Output    800 ml  Net   -320 ml    Physical Exam: Physical exam: Well-developed well-nourished in no acute distress.  Skin is warm and dry.  HEENT is normal.  Neck is supple. Chest is clear to auscultation with normal expansion.  Cardiovascular exam is regular rate and rhythm.  Abdominal exam nontender or distended. No masses palpated. Extremities show no edema. neuro grossly intact    Lab Results: Basic Metabolic Panel:  Recent Labs  11/05/15 1507 11/06/15 0223  NA 139 138  K 4.3 4.0  CL 106 104  CO2 24 25  GLUCOSE 108* 113*  BUN 13 12  CREATININE 0.67 0.64  CALCIUM 8.7* 8.8*   CBC:  Recent Labs  11/05/15 1507 11/06/15 0223  WBC 7.0 8.6  NEUTROABS 4.1  --   HGB 12.7 13.0  HCT 38.3 38.6  MCV 90.1 89.6  PLT 263 274   Cardiac Enzymes:  Recent Labs  11/05/15 0850 11/05/15 1507 11/05/15 2058  TROPONINI <0.03 <0.03 <0.03     Assessment/Plan:  1 unstable angina-patient had more chest pain at rest last evening and early a.m. Relieved with sublingual nitroglycerin. Dr. Antionette Char notes will plan to add heparin and keep in-house. Plan for PCI attempt of RCA on Tuesday. Continue aspirin, Plavix, beta blocker, nitrates and Norvasc. Intolerant to statins. 2 Status post pacemaker. 3 hyperlipidemia-intolerant to statins. Continue Zetia. 4 HTN-continue present meds  Kirk Ruths 11/06/2015, 9:15 AM

## 2015-11-06 NOTE — Progress Notes (Signed)
Report to June, RN, and [place], RN, to assume care of patient at this time.

## 2015-11-07 DIAGNOSIS — I200 Unstable angina: Secondary | ICD-10-CM | POA: Diagnosis not present

## 2015-11-07 LAB — CBC
HEMATOCRIT: 38.4 % (ref 36.0–46.0)
Hemoglobin: 12.9 g/dL (ref 12.0–15.0)
MCH: 29.6 pg (ref 26.0–34.0)
MCHC: 33.6 g/dL (ref 30.0–36.0)
MCV: 88.1 fL (ref 78.0–100.0)
PLATELETS: 243 10*3/uL (ref 150–400)
RBC: 4.36 MIL/uL (ref 3.87–5.11)
RDW: 12.7 % (ref 11.5–15.5)
WBC: 8.6 10*3/uL (ref 4.0–10.5)

## 2015-11-07 LAB — HEPARIN LEVEL (UNFRACTIONATED): Heparin Unfractionated: 0.43 IU/mL (ref 0.30–0.70)

## 2015-11-07 MED ORDER — CARBAMAZEPINE ER 400 MG PO TB12: 500.0000 mg | ORAL_TABLET | Freq: Two times a day (BID) | ORAL | Status: DC

## 2015-11-07 MED ORDER — ETHOSUXIMIDE 250 MG PO CAPS
500.0000 mg | ORAL_CAPSULE | Freq: Two times a day (BID) | ORAL | Status: DC
  Administered 2015-11-07 – 2015-11-10 (×5): 500 mg via ORAL
  Filled 2015-11-07 (×6): qty 2

## 2015-11-07 MED ORDER — CARBAMAZEPINE ER 400 MG PO TB12
500.0000 mg | ORAL_TABLET | Freq: Two times a day (BID) | ORAL | Status: DC
  Administered 2015-11-07 – 2015-11-10 (×5): 500 mg via ORAL
  Filled 2015-11-07 (×7): qty 1

## 2015-11-07 MED ORDER — ETHOSUXIMIDE 250 MG PO CAPS
250.0000 mg | ORAL_CAPSULE | Freq: Once | ORAL | Status: AC
  Administered 2015-11-07: 250 mg via ORAL
  Filled 2015-11-07: qty 1

## 2015-11-07 MED ORDER — CARBAMAZEPINE ER 400 MG PO TB12
400.0000 mg | ORAL_TABLET | Freq: Once | ORAL | Status: AC
  Administered 2015-11-07: 400 mg via ORAL
  Filled 2015-11-07: qty 1

## 2015-11-07 NOTE — Progress Notes (Signed)
    Subjective:  Brief episodes of CP; no dyspnea   Objective:  Filed Vitals:   11/06/15 1333 11/06/15 1500 11/06/15 2011 11/07/15 0511  BP: 112/58  158/95 132/53  Pulse: 75  72 67  Temp: 97.4 F (36.3 C)  97.9 F (36.6 C) 98.1 F (36.7 C)  TempSrc: Oral  Oral Oral  Resp: 19  16 18   Height:      Weight:      SpO2: 100% 95% 99% 98%    Intake/Output from previous day:  Intake/Output Summary (Last 24 hours) at 11/07/15 0813 Last data filed at 11/07/15 0557  Gross per 24 hour  Intake   1383 ml  Output      0 ml  Net   1383 ml    Physical Exam: Physical exam: Well-developed well-nourished in no acute distress.  Skin is warm and dry.  HEENT is normal.  Neck is supple. Chest is clear to auscultation with normal expansion.  Cardiovascular exam is regular rate and rhythm.  Abdominal exam nontender or distended. No masses palpated. Extremities show no edema. neuro grossly intact    Lab Results: Basic Metabolic Panel:  Recent Labs  11/05/15 1507 11/06/15 0223  NA 139 138  K 4.3 4.0  CL 106 104  CO2 24 25  GLUCOSE 108* 113*  BUN 13 12  CREATININE 0.67 0.64  CALCIUM 8.7* 8.8*   CBC:  Recent Labs  11/05/15 1507 11/06/15 0223 11/07/15 0409  WBC 7.0 8.6 8.6  NEUTROABS 4.1  --   --   HGB 12.7 13.0 12.9  HCT 38.3 38.6 38.4  MCV 90.1 89.6 88.1  PLT 263 274 243   Cardiac Enzymes:  Recent Labs  11/05/15 0850 11/05/15 1507 11/05/15 2058  TROPONINI <0.03 <0.03 <0.03     Assessment/Plan:  1 unstable angina-Brief CP but not prolonged. As outlined by Dr. Burt Knack will plan to continue heparin and keep in-house. Plan for PCI attempt of RCA on Tuesday. Continue aspirin, Plavix, beta blocker, nitrates and Norvasc. Intolerant to statins. 2 Status post pacemaker. 3 hyperlipidemia-intolerant to statins. Continue Zetia. 4 HTN-continue present meds  Kirk Ruths 11/07/2015, 8:13 AM

## 2015-11-08 ENCOUNTER — Other Ambulatory Visit: Payer: BLUE CROSS/BLUE SHIELD

## 2015-11-08 ENCOUNTER — Ambulatory Visit (HOSPITAL_COMMUNITY): Payer: BLUE CROSS/BLUE SHIELD

## 2015-11-08 DIAGNOSIS — I25110 Atherosclerotic heart disease of native coronary artery with unstable angina pectoris: Principal | ICD-10-CM

## 2015-11-08 LAB — CBC
HCT: 38 % (ref 36.0–46.0)
Hemoglobin: 12.5 g/dL (ref 12.0–15.0)
MCH: 29.1 pg (ref 26.0–34.0)
MCHC: 32.9 g/dL (ref 30.0–36.0)
MCV: 88.4 fL (ref 78.0–100.0)
PLATELETS: 260 10*3/uL (ref 150–400)
RBC: 4.3 MIL/uL (ref 3.87–5.11)
RDW: 12.8 % (ref 11.5–15.5)
WBC: 8.5 10*3/uL (ref 4.0–10.5)

## 2015-11-08 LAB — HEPARIN LEVEL (UNFRACTIONATED): HEPARIN UNFRACTIONATED: 0.42 [IU]/mL (ref 0.30–0.70)

## 2015-11-08 MED ORDER — HYDROCORTISONE 0.5 % EX CREA
TOPICAL_CREAM | CUTANEOUS | Status: DC | PRN
  Filled 2015-11-08: qty 28.35

## 2015-11-08 MED ORDER — CARVEDILOL 12.5 MG PO TABS
25.0000 mg | ORAL_TABLET | Freq: Two times a day (BID) | ORAL | Status: DC
  Administered 2015-11-08 – 2015-11-10 (×3): 25 mg via ORAL
  Filled 2015-11-08: qty 1
  Filled 2015-11-08: qty 2
  Filled 2015-11-08: qty 1
  Filled 2015-11-08: qty 2

## 2015-11-08 NOTE — Progress Notes (Signed)
[place] for heparin Indication: chest pain/ACS  Allergies  Allergen Reactions  . Aspirin Other (See Comments)    GI upset  . Erythromycin Nausea Only    Gi upset  . Lisinopril Cough  . Niacin And Related     Pt does not recall reactions   . Statins     Muscle pain  . Sulfa Drugs Cross Reactors Swelling  . Tetracyclines & Related Nausea Only  . Penicillins Nausea Only and Rash    Has patient had a PCN reaction causing immediate rash, facial/tongue/throat swelling, SOB or lightheadedness with hypotension: Yes Has patient had a PCN reaction causing severe rash involving mucus membranes or skin necrosis: No Has patient had a PCN reaction that required hospitalization No Has patient had a PCN reaction occurring within the last 10 years: No If all of the above answers are "NO", then may proceed with Cephalosporin use.     Patient Measurements: Height: 5\' 8"  (172.7 cm) Weight: 190 lb (86.183 kg) IBW/kg (Calculated) : 63.9 Heparin Dosing Weight: 81.8 kg  Vital Signs: Temp: 97.7 F (36.5 C) (05/08 0552) Temp Source: Oral (05/08 0552) BP: 137/84 mmHg (05/08 0954) Pulse Rate: 59 (05/08 0552)  Labs:  Recent Labs  11/05/15 1507 11/05/15 2058 11/06/15 0223  11/06/15 2210 11/07/15 0409 11/08/15 0425  HGB 12.7  --  13.0  --   --  12.9 12.5  HCT 38.3  --  38.6  --   --  38.4 38.0  PLT 263  --  274  --   --  243 260  HEPARINUNFRC  --   --   --   < > 0.42 0.43 0.42  CREATININE 0.67  --  0.64  --   --   --   --   TROPONINI <0.03 <0.03  --   --   --   --   --   < > = values in this interval not displayed.  Estimated Creatinine Clearance: 79.5 mL/min (by C-G formula based on Cr of 0.64).   Assessment: 65 yo female w/ hx of CAD s/p CABG in 2007 presented w/ chest pain on 5/5. Plan for PCI attempt on Tuesday 5/9.   Heparin level remains therapeutic this morning at 0.42 units/mL. Hgb and plts remain stable and WNL. No bleeding  noted.  Goal of Therapy:  Heparin level 0.3-0.7 units/ml Monitor platelets by anticoagulation protocol: Yes   Plan:  Continue heparin infusion at 1000 units/hr Check daily heparin level along with CBC Continue to monitor for s/s bleeding   Jaquasia Doscher D. Emaleigh Guimond, PharmD, BCPS Clinical Pharmacist Pager: [phone] 11/08/2015 10:41 AM

## 2015-11-08 NOTE — Progress Notes (Signed)
Patient Name: Rachel Clark Date of Encounter: 11/08/2015  Active Problems:   Coronary artery disease, post CABG 2007    Dyslipidemia   Unstable angina (HCC)   Chest pain   Chest pain with high risk for cardiac etiology   Length of Stay:   SUBJECTIVE  No angina at rest overnight. Has not walked outside room.  CURRENT MEDS . amLODipine  10 mg Oral Daily  . aspirin EC  81 mg Oral Daily  . carbamazepine  500 mg Oral BID  . carvedilol  12.5 mg Oral BID WC  . clopidogrel  75 mg Oral Daily  . DULoxetine  60 mg Oral q morning - 10a  . ethosuximide  500 mg Oral BID  . ezetimibe  10 mg Oral Daily  . isosorbide mononitrate  30 mg Oral Daily  . levothyroxine  50 mcg Oral QAC breakfast  . sodium chloride flush  3 mL Intravenous Q12H    OBJECTIVE   Intake/Output Summary (Last 24 hours) at 11/08/15 1036 Last data filed at 11/08/15 0900  Gross per 24 hour  Intake    960 ml  Output      0 ml  Net    960 ml   Filed Weights   11/05/15 0831 11/05/15 1353  Weight: 86.183 kg (190 lb) 86.183 kg (190 lb)    PHYSICAL EXAM Filed Vitals:   11/07/15 1400 11/07/15 1946 11/08/15 0552 11/08/15 0954  BP: 110/51 125/60 127/59 137/84  Pulse: 71 70 59   Temp: 97.3 F (36.3 C) 97.8 F (36.6 C) 97.7 F (36.5 C)   TempSrc: Oral Oral Oral   Resp: 19 18 16    Height:      Weight:      SpO2: 97% 100% 99%    General: Alert, oriented x3, no distress Head: no evidence of trauma, PERRL, EOMI, no exophtalmos or lid lag, no myxedema, no xanthelasma; normal ears, nose and oropharynx Neck: normal jugular venous pulsations and no hepatojugular reflux; brisk carotid pulses without delay and no carotid bruits Chest: clear to auscultation, no signs of consolidation by percussion or palpation, normal fremitus, symmetrical and full respiratory excursions, pacemaker L subclavian area Cardiovascular: normal position and quality of the apical impulse, regular rhythm, normal first and second heart sounds,  no rubs or gallops, no murmur Abdomen: no tenderness or distention, no masses by palpation, no abnormal pulsatility or arterial bruits, normal bowel sounds, no hepatosplenomegaly Extremities: no clubbing, cyanosis or edema; 2+ radial, ulnar and brachial pulses bilaterally; 2+ right femoral, posterior tibial and dorsalis pedis pulses; 2+ left femoral, posterior tibial and dorsalis pedis pulses; no subclavian or femoral bruits Neurological: grossly nonfocal  LABS  CBC  Recent Labs  11/05/15 1507  11/07/15 0409 11/08/15 0425  WBC 7.0  < > 8.6 8.5  NEUTROABS 4.1  --   --   --   HGB 12.7  < > 12.9 12.5  HCT 38.3  < > 38.4 38.0  MCV 90.1  < > 88.1 88.4  PLT 263  < > 243 260  < > = values in this interval not displayed. Basic Metabolic Panel  Recent Labs  11/05/15 1507 11/06/15 0223  NA 139 138  K 4.3 4.0  CL 106 104  CO2 24 25  GLUCOSE 108* 113*  BUN 13 12  CREATININE 0.67 0.64  CALCIUM 8.7* 8.8*   Liver Function Tests  Recent Labs  11/05/15 1507  AST 14*  ALT 15  ALKPHOS 61  BILITOT 0.5  PROT 6.6  ALBUMIN 3.5   No results for input(s): LIPASE, AMYLASE in the last 72 hours. Cardiac Enzymes  Recent Labs  11/05/15 1507 11/05/15 2058  TROPONINI <0.03 <0.03   Fasting Lipid Panel  Recent Labs  11/06/15 0223  CHOL 232*  HDL 80  LDLCALC 132*  TRIG 102  CHOLHDL 2.9    Radiology Studies Imaging results have been reviewed   TELE NSR  ASSESSMENT AND PLAN  1. CAD s/p CABG with unstable angina - patient had more chest pain at rest on this admission, but none last night. Per Dr. Antionette Char plan: PCI attempt of RCA on Tuesday. Continue aspirin, Plavix, beta blocker (increase dose today), nitrates and Norvasc (max dose). Consider adding Ranexa if PCI unsuccessful. Intolerant to statins. 2. Status post pacemaker. 3. Hyperlipidemia-intolerant to statins. Continue Zetia. Strongly consider PCSK9 inhibitors. 4. HTN - continue to prefer calcium channel blockers and  beta blockers for antianginal properties.   Sanda Klein, MD, [facility] [phone] office [phone] pager 11/08/2015 10:36 AM

## 2015-11-08 NOTE — Care Management Obs Status (Signed)
[facility] NOTIFICATION   Patient Details  Name: [name] MRN: [id number] Date of Birth: 1951/04/16   Medicare Observation Status Notification Given:  Yes    Dawayne Patricia, RN 11/08/2015, 4:25 PM

## 2015-11-09 ENCOUNTER — Other Ambulatory Visit: Payer: Self-pay

## 2015-11-09 ENCOUNTER — Encounter (HOSPITAL_COMMUNITY)
Admission: EM | Disposition: A | Discharge status: Home / Self Care | Payer: Self-pay | Source: Home / Self Care | Attending: Cardiovascular Disease

## 2015-11-09 ENCOUNTER — Encounter (HOSPITAL_COMMUNITY): Payer: Self-pay | Admitting: Cardiovascular Disease

## 2015-11-09 DIAGNOSIS — Z85038 Personal history of other malignant neoplasm of large intestine: Secondary | ICD-10-CM | POA: Diagnosis not present

## 2015-11-09 DIAGNOSIS — Z79899 Other long term (current) drug therapy: Secondary | ICD-10-CM | POA: Diagnosis not present

## 2015-11-09 DIAGNOSIS — Z951 Presence of aortocoronary bypass graft: Secondary | ICD-10-CM | POA: Diagnosis not present

## 2015-11-09 DIAGNOSIS — R079 Chest pain, unspecified: Secondary | ICD-10-CM | POA: Diagnosis not present

## 2015-11-09 DIAGNOSIS — Z888 Allergy status to other drugs, medicaments and biological substances status: Secondary | ICD-10-CM | POA: Diagnosis not present

## 2015-11-09 DIAGNOSIS — Z7902 Long term (current) use of antithrombotics/antiplatelets: Secondary | ICD-10-CM | POA: Diagnosis not present

## 2015-11-09 DIAGNOSIS — E785 Hyperlipidemia, unspecified: Secondary | ICD-10-CM | POA: Diagnosis not present

## 2015-11-09 DIAGNOSIS — Z9221 Personal history of antineoplastic chemotherapy: Secondary | ICD-10-CM | POA: Diagnosis not present

## 2015-11-09 DIAGNOSIS — I2510 Atherosclerotic heart disease of native coronary artery without angina pectoris: Secondary | ICD-10-CM | POA: Diagnosis not present

## 2015-11-09 DIAGNOSIS — Z981 Arthrodesis status: Secondary | ICD-10-CM | POA: Diagnosis not present

## 2015-11-09 DIAGNOSIS — E039 Hypothyroidism, unspecified: Secondary | ICD-10-CM | POA: Diagnosis present

## 2015-11-09 DIAGNOSIS — I252 Old myocardial infarction: Secondary | ICD-10-CM | POA: Diagnosis not present

## 2015-11-09 DIAGNOSIS — D509 Iron deficiency anemia, unspecified: Secondary | ICD-10-CM | POA: Diagnosis present

## 2015-11-09 DIAGNOSIS — Z8249 Family history of ischemic heart disease and other diseases of the circulatory system: Secondary | ICD-10-CM | POA: Diagnosis not present

## 2015-11-09 DIAGNOSIS — I200 Unstable angina: Secondary | ICD-10-CM | POA: Diagnosis not present

## 2015-11-09 DIAGNOSIS — K219 Gastro-esophageal reflux disease without esophagitis: Secondary | ICD-10-CM | POA: Diagnosis present

## 2015-11-09 DIAGNOSIS — I25110 Atherosclerotic heart disease of native coronary artery with unstable angina pectoris: Secondary | ICD-10-CM | POA: Diagnosis not present

## 2015-11-09 DIAGNOSIS — I10 Essential (primary) hypertension: Secondary | ICD-10-CM | POA: Diagnosis not present

## 2015-11-09 DIAGNOSIS — F329 Major depressive disorder, single episode, unspecified: Secondary | ICD-10-CM | POA: Diagnosis present

## 2015-11-09 DIAGNOSIS — M797 Fibromyalgia: Secondary | ICD-10-CM | POA: Diagnosis present

## 2015-11-09 DIAGNOSIS — Z950 Presence of cardiac pacemaker: Secondary | ICD-10-CM | POA: Diagnosis not present

## 2015-11-09 DIAGNOSIS — Z87891 Personal history of nicotine dependence: Secondary | ICD-10-CM | POA: Diagnosis not present

## 2015-11-09 DIAGNOSIS — Z880 Allergy status to penicillin: Secondary | ICD-10-CM | POA: Diagnosis not present

## 2015-11-09 DIAGNOSIS — D472 Monoclonal gammopathy: Secondary | ICD-10-CM | POA: Diagnosis not present

## 2015-11-09 DIAGNOSIS — Z881 Allergy status to other antibiotic agents status: Secondary | ICD-10-CM | POA: Diagnosis not present

## 2015-11-09 DIAGNOSIS — Z886 Allergy status to analgesic agent status: Secondary | ICD-10-CM | POA: Diagnosis not present

## 2015-11-09 HISTORY — PX: CARDIAC CATHETERIZATION: SHX172

## 2015-11-09 LAB — CBC
HEMATOCRIT: 34.6 % — AB (ref 36.0–46.0)
HEMOGLOBIN: 11.5 g/dL — AB (ref 12.0–15.0)
MCH: 29.4 pg (ref 26.0–34.0)
MCHC: 33.2 g/dL (ref 30.0–36.0)
MCV: 88.5 fL (ref 78.0–100.0)
Platelets: 223 10*3/uL (ref 150–400)
RBC: 3.91 MIL/uL (ref 3.87–5.11)
RDW: 12.9 % (ref 11.5–15.5)
WBC: 7.5 10*3/uL (ref 4.0–10.5)

## 2015-11-09 LAB — PROTIME-INR
INR: 1.11 (ref 0.00–1.49)
PROTHROMBIN TIME: 14.5 s (ref 11.6–15.2)

## 2015-11-09 LAB — HEPARIN LEVEL (UNFRACTIONATED): HEPARIN UNFRACTIONATED: 0.49 [IU]/mL (ref 0.30–0.70)

## 2015-11-09 LAB — POCT ACTIVATED CLOTTING TIME: Activated Clotting Time: 322 seconds

## 2015-11-09 SURGERY — CORONARY STENT INTERVENTION

## 2015-11-09 MED ORDER — SODIUM CHLORIDE 0.9% FLUSH
3.0000 mL | Freq: Two times a day (BID) | INTRAVENOUS | Status: DC
Start: 2015-11-09 — End: 2015-11-10
  Administered 2015-11-09: 17:00:00 3 mL via INTRAVENOUS

## 2015-11-09 MED ORDER — NITROGLYCERIN 1 MG/10 ML FOR IR/CATH LAB
INTRA_ARTERIAL | Status: DC | PRN
  Administered 2015-11-09 (×2): 100 ug via INTRACORONARY
  Administered 2015-11-09 (×2): 150 ug via INTRACORONARY

## 2015-11-09 MED ORDER — NITROGLYCERIN 1 MG/10 ML FOR IR/CATH LAB
INTRA_ARTERIAL | Status: AC
  Filled 2015-11-09: qty 10

## 2015-11-09 MED ORDER — SODIUM CHLORIDE 0.9% FLUSH: 3.0000 mL | INTRAVENOUS | Status: DC | PRN

## 2015-11-09 MED ORDER — FENTANYL CITRATE (PF) 100 MCG/2ML IJ SOLN
INTRAMUSCULAR | Status: AC
  Filled 2015-11-09: qty 2

## 2015-11-09 MED ORDER — BIVALIRUDIN 250 MG IV SOLR
INTRAVENOUS | Status: AC
  Filled 2015-11-09: qty 250

## 2015-11-09 MED ORDER — HEPARIN (PORCINE) IN NACL 2-0.9 UNIT/ML-% IJ SOLN
INTRAMUSCULAR | Status: DC | PRN
  Administered 2015-11-09: 1500 mL

## 2015-11-09 MED ORDER — FENTANYL CITRATE (PF) 100 MCG/2ML IJ SOLN
INTRAMUSCULAR | Status: DC | PRN
  Administered 2015-11-09 (×3): 25 ug via INTRAVENOUS

## 2015-11-09 MED ORDER — IOPAMIDOL (ISOVUE-370) INJECTION 76%
INTRAVENOUS | Status: AC
  Filled 2015-11-09: qty 125

## 2015-11-09 MED ORDER — LIDOCAINE-EPINEPHRINE 1 %-1:100000 IJ SOLN
INTRAMUSCULAR | Status: DC | PRN
  Administered 2015-11-09: 1 mL

## 2015-11-09 MED ORDER — MIDAZOLAM HCL 2 MG/2ML IJ SOLN
INTRAMUSCULAR | Status: DC | PRN
  Administered 2015-11-09: 1 mg via INTRAVENOUS
  Administered 2015-11-09: 2 mg via INTRAVENOUS
  Administered 2015-11-09: 1 mg via INTRAVENOUS

## 2015-11-09 MED ORDER — DIAZEPAM 5 MG PO TABS
5.0000 mg | ORAL_TABLET | Freq: Three times a day (TID) | ORAL | Status: DC | PRN
  Administered 2015-11-09: 5 mg via ORAL
  Filled 2015-11-09: qty 1

## 2015-11-09 MED ORDER — SODIUM CHLORIDE 0.9 % IV SOLN: 250.0000 mL | INTRAVENOUS | Status: DC | PRN

## 2015-11-09 MED ORDER — SODIUM CHLORIDE 0.9 % WEIGHT BASED INFUSION
3.0000 mL/kg/h | INTRAVENOUS | Status: DC
  Administered 2015-11-09: 3 mL/kg/h via INTRAVENOUS

## 2015-11-09 MED ORDER — LIDOCAINE HCL (PF) 1 % IJ SOLN
INTRAMUSCULAR | Status: AC
  Filled 2015-11-09: qty 30

## 2015-11-09 MED ORDER — BIVALIRUDIN BOLUS VIA INFUSION - CUPID
INTRAVENOUS | Status: DC | PRN
  Administered 2015-11-09: 66.075 mg via INTRAVENOUS

## 2015-11-09 MED ORDER — IOPAMIDOL (ISOVUE-370) INJECTION 76%
INTRAVENOUS | Status: AC
  Filled 2015-11-09: qty 50

## 2015-11-09 MED ORDER — LIDOCAINE-EPINEPHRINE 1 %-1:100000 IJ SOLN
INTRAMUSCULAR | Status: AC
  Filled 2015-11-09: qty 1

## 2015-11-09 MED ORDER — SODIUM CHLORIDE 0.9 % WEIGHT BASED INFUSION: 1.0000 mL/kg/h | INTRAVENOUS | Status: DC

## 2015-11-09 MED ORDER — ASPIRIN 81 MG PO CHEW
81.0000 mg | CHEWABLE_TABLET | ORAL | Status: AC
  Administered 2015-11-09: 81 mg via ORAL
  Filled 2015-11-09: qty 1

## 2015-11-09 MED ORDER — LIDOCAINE HCL (PF) 1 % IJ SOLN
INTRAMUSCULAR | Status: DC | PRN
  Administered 2015-11-09: 18 mL

## 2015-11-09 MED ORDER — SODIUM CHLORIDE 0.9 % IV SOLN
250.0000 mg | INTRAVENOUS | Status: DC | PRN
  Administered 2015-11-09: 1.75 mg/kg/h via INTRAVENOUS

## 2015-11-09 MED ORDER — SODIUM CHLORIDE 0.9% FLUSH: 3.0000 mL | Freq: Two times a day (BID) | INTRAVENOUS | Status: DC

## 2015-11-09 MED ORDER — HEPARIN (PORCINE) IN NACL 2-0.9 UNIT/ML-% IJ SOLN
INTRAMUSCULAR | Status: AC
  Filled 2015-11-09: qty 1000

## 2015-11-09 MED ORDER — IOPAMIDOL (ISOVUE-370) INJECTION 76%
INTRAVENOUS | Status: DC | PRN
  Administered 2015-11-09: 110 mL via INTRA_ARTERIAL

## 2015-11-09 MED ORDER — MIDAZOLAM HCL 2 MG/2ML IJ SOLN
INTRAMUSCULAR | Status: AC
  Filled 2015-11-09: qty 2

## 2015-11-09 MED ORDER — SODIUM CHLORIDE 0.9 % WEIGHT BASED INFUSION
3.0000 mL/kg/h | INTRAVENOUS | Status: AC
  Administered 2015-11-09: 13:00:00 3 mL/kg/h via INTRAVENOUS

## 2015-11-09 SURGICAL SUPPLY — 24 items
BALLN EMERGE MR 2.0X20 (BALLOONS) ×3
BALLN TREK OTW 1.5X15 (BALLOONS) ×3
BALLN ~~LOC~~ EUPHORA RX 2.75X27 (BALLOONS) ×3
BALLOON EMERGE MR 2.0X20 (BALLOONS) IMPLANT
BALLOON TREK OTW 1.5X15 (BALLOONS) IMPLANT
BALLOON ~~LOC~~ EUPHORA RX 2.75X27 (BALLOONS) IMPLANT
CATH INFINITI 5FR MULTPACK ANG (CATHETERS) ×2 IMPLANT
CATH INFINITI JR4 5F (CATHETERS) ×2 IMPLANT
CATH VISTA GUIDE 6FR AL1 (CATHETERS) ×2 IMPLANT
DEVICE CLOSURE PERCLS PRGLD 6F (VASCULAR PRODUCTS) IMPLANT
KIT ENCORE 26 ADVANTAGE (KITS) ×3 IMPLANT
KIT HEART LEFT (KITS) ×3 IMPLANT
PACK CARDIAC CATHETERIZATION (CUSTOM PROCEDURE TRAY) ×3 IMPLANT
PERCLOSE PROGLIDE 6F (VASCULAR PRODUCTS) ×3
SHEATH PINNACLE 6F 10CM (SHEATH) ×2 IMPLANT
STENT SYNERGY DES 2.25X24 (Permanent Stent) ×2 IMPLANT
STENT SYNERGY DES 2.25X38 (Permanent Stent) ×2 IMPLANT
STENT SYNERGY DES 2.5X20 (Permanent Stent) ×2 IMPLANT
TRANSDUCER W/STOPCOCK (MISCELLANEOUS) ×3 IMPLANT
TUBING CIL FLEX 10 FLL-RA (TUBING) ×3 IMPLANT
WIRE COUGAR XT STRL 190CM (WIRE) ×2 IMPLANT
WIRE EMERALD 3MM-J .035X150CM (WIRE) ×2 IMPLANT
WIRE HI TORQ WHISPER MS 300CM (WIRE) ×2 IMPLANT
WIRE MAILMAN 300CM (WIRE) ×2 IMPLANT

## 2015-11-09 NOTE — Progress Notes (Signed)
[place] for heparin Indication: chest pain/ACS  Allergies  Allergen Reactions  . Aspirin Other (See Comments)    GI upset  . Erythromycin Nausea Only    Gi upset  . Lisinopril Cough  . Niacin And Related     Pt does not recall reactions   . Statins     Muscle pain  . Sulfa Drugs Cross Reactors Swelling  . Tetracyclines & Related Nausea Only  . Penicillins Nausea Only and Rash    Has patient had a PCN reaction causing immediate rash, facial/tongue/throat swelling, SOB or lightheadedness with hypotension: Yes Has patient had a PCN reaction causing severe rash involving mucus membranes or skin necrosis: No Has patient had a PCN reaction that required hospitalization No Has patient had a PCN reaction occurring within the last 10 years: No If all of the above answers are "NO", then may proceed with Cephalosporin use.     Patient Measurements: Height: 5\' 8"  (172.7 cm) Weight: 194 lb 3.6 oz (88.1 kg) IBW/kg (Calculated) : 63.9 Heparin Dosing Weight: 81.8 kg  Vital Signs: Temp: 98.1 F (36.7 C) (05/09 0515) Temp Source: Oral (05/09 0515) BP: 143/49 mmHg (05/09 0515) Pulse Rate: 68 (05/09 0515)  Labs:  Recent Labs  11/07/15 0409 11/08/15 0425 11/09/15 0432 11/09/15 0436  HGB 12.9 12.5  --  11.5*  HCT 38.4 38.0  --  34.6*  PLT 243 260  --  223  LABPROT  --   --  14.5  --   INR  --   --  1.11  --   HEPARINUNFRC 0.43 0.42  --  0.49    Estimated Creatinine Clearance: 80.4 mL/min (by C-G formula based on Cr of 0.64).   Assessment: 65 yo female w/ hx of CAD s/p CABG in 2007 presented w/ chest pain on 5/5. Plan for PCI today.   Heparin level remains therapeutic this morning at 0.49 units/mL. Hgb and plts remain stable and WNL. No bleeding noted.  Goal of Therapy:  Heparin level 0.3-0.7 units/ml Monitor platelets by anticoagulation protocol: Yes   Plan:  Continue heparin infusion at 1000 units/hr Check daily heparin level  along with CBC Continue to monitor for s/s bleeding  Follow up after cath for any anticoagulation plans  Sarahjane Matherly D. Lavergne Hiltunen, PharmD, BCPS Clinical Pharmacist Pager: [phone] 11/09/2015 8:39 AM

## 2015-11-09 NOTE — Interval H&P Note (Signed)
Cath Lab Visit (complete for each Cath Lab visit)  Clinical Evaluation Leading to the Procedure:   ACS: Yes.    Non-ACS:    Anginal Classification: CCS IV  Anti-ischemic medical therapy: Maximal Therapy (2 or more classes of medications)  Non-Invasive Test Results: No non-invasive testing performed  Prior CABG: Previous CABG      History and Physical Interval Note:  11/09/2015 9:04 AM  [name]  has presented today for surgery, with the diagnosis of cad  The various methods of treatment have been discussed with the patient and family. After consideration of risks, benefits and other options for treatment, the patient has consented to  Procedure(s): Coronary Stent Intervention (N/A) as a surgical intervention .  The patient's history has been reviewed, patient examined, no change in status, stable for surgery.  I have reviewed the patient's chart and labs.  Questions were answered to the patient's satisfaction.     Sherren Mocha

## 2015-11-10 ENCOUNTER — Telehealth: Payer: Self-pay | Admitting: Cardiology

## 2015-11-10 DIAGNOSIS — R079 Chest pain, unspecified: Secondary | ICD-10-CM

## 2015-11-10 DIAGNOSIS — I2510 Atherosclerotic heart disease of native coronary artery without angina pectoris: Secondary | ICD-10-CM

## 2015-11-10 LAB — BASIC METABOLIC PANEL
ANION GAP: 11 (ref 5–15)
BUN: 9 mg/dL (ref 6–20)
CALCIUM: 8.8 mg/dL — AB (ref 8.9–10.3)
CHLORIDE: 102 mmol/L (ref 101–111)
CO2: 26 mmol/L (ref 22–32)
CREATININE: 0.68 mg/dL (ref 0.44–1.00)
GFR calc non Af Amer: >60 mL/min (ref >60)
Glucose, Bld: 174 mg/dL — ABNORMAL HIGH (ref 65–99)
Potassium: 4.2 mmol/L (ref 3.5–5.1)
SODIUM: 139 mmol/L (ref 135–145)

## 2015-11-10 LAB — CBC
HCT: 38 % (ref 36.0–46.0)
HEMOGLOBIN: 12.6 g/dL (ref 12.0–15.0)
MCH: 29.9 pg (ref 26.0–34.0)
MCHC: 33.2 g/dL (ref 30.0–36.0)
MCV: 90.3 fL (ref 78.0–100.0)
PLATELETS: 257 10*3/uL (ref 150–400)
RBC: 4.21 MIL/uL (ref 3.87–5.11)
RDW: 13.1 % (ref 11.5–15.5)
WBC: 6.7 10*3/uL (ref 4.0–10.5)

## 2015-11-10 MED ORDER — CARVEDILOL 25 MG PO TABS: 25.0000 mg | ORAL_TABLET | Freq: Two times a day (BID) | ORAL | Status: DC

## 2015-11-10 MED ORDER — ANGIOPLASTY BOOK
Freq: Once | Status: DC
Start: 2015-11-10 — End: 2015-11-10
  Filled 2015-11-10: qty 1

## 2015-11-10 MED ORDER — AMLODIPINE BESYLATE 10 MG PO TABS: 10.0000 mg | ORAL_TABLET | Freq: Every day | ORAL | Status: DC

## 2015-11-10 MED ORDER — ISOSORBIDE MONONITRATE ER 30 MG PO TB24: 30.0000 mg | ORAL_TABLET | Freq: Every day | ORAL | Status: DC

## 2015-11-10 MED ORDER — ASPIRIN 81 MG PO TBEC: 81.0000 mg | DELAYED_RELEASE_TABLET | Freq: Every day | ORAL | Status: DC

## 2015-11-10 NOTE — Telephone Encounter (Signed)
TCM per Ria Comment-  5/26 @ 10am w/ Bryan= NL

## 2015-11-10 NOTE — Telephone Encounter (Signed)
Patient discharged 11/10/15 First TCM call should be 11/11/15

## 2015-11-10 NOTE — Discharge Summary (Signed)
Discharge Summary    Patient ID: Rachel Clark,  MRN: [id number], DOB/AGE: June 08, 1951 65 y.o.  Admit date: 11/05/2015 Discharge date: 11/10/2015  Primary Care Provider: [place] [facility] Primary Cardiologist: Dr. [name]  Discharge Diagnoses    Active Problems:   Chest pain with high risk for cardiac etiology   Unstable angina [facility])   Coronary artery disease, post CABG 2007    Dyslipidemia   Essential hypertension   Allergies Allergies  Allergen Reactions  . Aspirin Other (See Comments)    GI upset  . Erythromycin Nausea Only    Gi upset  . Lisinopril Cough  . Niacin And Related     Pt does not recall reactions   . Statins     Muscle pain  . Sulfa Drugs Cross Reactors Swelling  . Tetracyclines & Related Nausea Only  . Penicillins Nausea Only and Rash    Has patient had a PCN reaction causing immediate rash, facial/tongue/throat swelling, SOB or lightheadedness with hypotension: Yes Has patient had a PCN reaction causing severe rash involving mucus membranes or skin necrosis: No Has patient had a PCN reaction that required hospitalization No Has patient had a PCN reaction occurring within the last 10 years: No If all of the above answers are "NO", then may proceed with Cephalosporin use.     Diagnostic Studies/Procedures    Conclusion     Ramus lesion, 30% stenosed.  Ost LAD to Prox LAD lesion, 95% stenosed.  Mid RCA-1 lesion, 95% stenosed.  Mid RCA-2 lesion, 90% stenosed.  SVG .  Origin lesion, 100% stenosed.  Dist LAD-1 lesion, 70% stenosed.  Dist LAD-2 lesion, 50% stenosed.  Ost RCA to Prox RCA lesion, 90% stenosed. Post intervention, there is a 0% residual stenosis. The lesion was previously treated with a stent (unknown type).  1. Severe native vessel disease of the LAD and RCA 2. S/P CABG with continued patency of the LIMA-LAD and chronic occlusion of the SVG-RCA 3. Severe diffuse disease of the RCA, treated successfully with full-metal  jacket stenting (drug-eluting)  Lifelong DAPT as tolerated. Continue aggressive medical therapy. Anticipate discharge tomorrow as long as clinically stable.      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  Hospital Course     Rachel Clark Is a 65 year old female of Dr. [name] with a past medical history of CAD s/p CABG in 2007, HLD, colon CA,s/p MDT PPM placed in 03/2015 for CHB who presented to the ED with c/o crushing chest pain while she was laying in bed along with a hot flushing sensation in her bilateral lower extremities that spread up to her head for the past 3 days on 11/05/2015. She was seen in the ED at that time and noted to have EKG presentation of slight ST depression in V4-V6 but this was noted to be similar to previous EKGs. She received serial enzymes which trended negative. Of note she was known to have diffuse disease throughout her RCA and was on Plavix. At that this, she was seen by Dr. Burt Knack in the ED and plan for overnight obs, it is possible discharge in the a.m. If she had no recurrent episodes of chest pain. On assessment the next day she was noted  to have an episode of chest pain in the early am which was relieved with sublingual nitroglycerin. At that time per Dr. Antionette Char note, patient was kept in house and started on IV heparin with plan for PCI attempt of the RCA on Tuesday 11/09/2015. She did have intermittent brief episodes of chest pain over the weekend. On 11/10/2015 she underwent LHC with coronary stent intervention with a groin approach with Dr. Burt Knack. Cath showed severe native vessel disease to the LAD and RCA, s/p CABG with patency of the LIMA-LAD and chronic occlusion of the SVG-RCA. She had severe diffuse diease of the RCA, which was successfully treated  with full metal jacket stenting with DES/synergy stents. Recommendations for lifelong DAPT and continued aggressive medical therapy. She ambulated with cardiac rehab with no complaints or complications.    On the day of discharge her labs are stable, with stable cath site to the right groin. She was seen and examined by Dr. Gwenlyn Found who feels she is appropriate for discharge today.  _____________  Discharge Vitals Blood pressure 158/64, pulse 84, temperature 98 F (36.7 C), temperature source Oral, resp. rate 17, height 5\' 8"  (1.727 m), weight 196 lb 3.4 oz (89 kg), SpO2 97 %.  Filed Weights   11/05/15 1353 11/09/15 0500 11/10/15 0624  Weight: 190 lb (86.183 kg) 194 lb 3.6 oz (88.1 kg) 196 lb 3.4 oz (89 kg)    Labs & Radiologic Studies    CBC  Recent Labs  11/09/15 0436 11/10/15 0739  WBC 7.5 6.7  HGB 11.5* 12.6  HCT 34.6* 38.0  MCV 88.5 90.3  PLT 223 [id number]   Basic Metabolic Panel  Recent Labs  11/10/15 0739  NA 139  K 4.2  CL 102  CO2 26  GLUCOSE 174*  BUN 9  CREATININE 0.68  CALCIUM 8.8*   _____________  Dg Chest 2 View  11/05/2015  CLINICAL DATA:  Acute chest pain. EXAM: CHEST  2 VIEW COMPARISON:  July 25, 2015. FINDINGS: The heart size and mediastinal contours are within normal limits. Both lungs are clear. No pneumothorax or pleural effusion is noted. Status post coronary artery bypass graft. Left-sided pacemaker is unchanged in position. The visualized skeletal structures are unremarkable. IMPRESSION: No active cardiopulmonary disease. Electronically Signed   By: Marijo Conception, M.D.   On: 11/05/2015 09:17   Disposition   Pt is being discharged home today in good condition.  Follow-up Plans & Appointments    Follow-up Information    Follow up with HAGER, BRYAN, PA-C On 11/26/2015.   Specialties:  Physician Assistant, Radiology, Interventional Cardiology   Why:  10:30 am for hospital follow up.    Contact information:   [address] [place] [address] [phone]      Discharge Instructions    Amb Referral to Cardiac Rehabilitation    Complete by:  As directed   Diagnosis:   PTCA Coronary Stents       Diet - low sodium heart healthy    Complete by:  As directed      Increase activity slowly    Complete by:  As directed            Discharge Medications   Current Discharge Medication List    START taking these medications   Details  aspirin EC 81 MG EC tablet Take 1 tablet (81 mg total) by mouth daily.      CONTINUE these medications which have CHANGED   Details  amLODipine (NORVASC) 10  MG tablet Take 1 tablet (10 mg total) by mouth daily. Qty: 30 tablet, Refills: 11    carvedilol (COREG) 25 MG tablet Take 1 tablet (25 mg total) by mouth 2 (two) times daily with a meal. Qty: 60 tablet, Refills: 11    isosorbide mononitrate (IMDUR) 30 MG 24 hr tablet Take 1 tablet (30 mg total) by mouth daily. Qty: 30 tablet, Refills: 11      CONTINUE these medications which have NOT CHANGED   Details  acetaminophen (TYLENOL) 500 MG tablet Take 500-1,000 mg by mouth daily as needed for mild pain or headache.    Azelastine HCl 0.15 % SOLN Place 1 spray into both nostrils daily as needed (seasonal allergies).  Refills: 12    carbamazepine (TEGRETOL XR) 100 MG 12 hr tablet TAKE FIVE TABLETS BY MOUTH TWICE DAILY Qty: 900 tablet, Refills: 2    clopidogrel (PLAVIX) 75 MG tablet Take 1 tablet (75 mg total) by mouth daily. KEEP OV. Qty: 90 tablet, Refills: 0    DULoxetine (CYMBALTA) 60 MG capsule Take 60 mg by mouth daily.     estradiol (VIVELLE-DOT) 0.05 MG/24HR patch Place 1 patch onto the skin 2 (two) times a week. Sunday, Wednesday    ethosuximide (ZARONTIN) 250 MG capsule Take 1 capsule (250mg ) in the morning, 2 capsules (500mg ) in the afternoon, and 2 capsules (500mg ) at night Qty: 150 capsule, Refills: 0    ezetimibe (ZETIA) 10 MG tablet Take 1 tablet (10 mg total) by mouth daily. KEEP OV. Qty: 90  tablet, Refills: 0    levothyroxine (SYNTHROID, LEVOTHROID) 50 MCG tablet Take 50 mcg by mouth daily.     nitroGLYCERIN (NITROSTAT) 0.4 MG SL tablet Place 1 tablet (0.4 mg total) under the tongue every 5 (five) minutes as needed. For chest pain. Qty: 25 tablet, Refills: 6    tiZANidine (ZANAFLEX) 2 MG tablet Take 4 tablets by mouth at bedtime.  Refills: 2         Aspirin prescribed at discharge?  Yes High Intensity Statin Prescribed? (Lipitor 40-80mg  or Crestor 20-40mg ): No: Reports statin intolerance Beta Blocker Prescribed? Yes For EF <40%, was ACEI/ARB Prescribed? No: allergy ADP Receptor Inhibitor Prescribed? (i.e. Plavix etc.-Includes Medically Managed Patients): Yes For EF <40%, Aldosterone Inhibitor Prescribed? No:  Was EF assessed during THIS hospitalization? Yes Was Cardiac Rehab II ordered? (Included Medically managed Patients): Yes   Outstanding Labs/Studies   None  Duration of Discharge Encounter   Greater than 30 minutes including physician time.  Signed, Reino Bellis NP-C 11/10/2015, 10:43 AM

## 2015-11-10 NOTE — Progress Notes (Signed)
CARDIAC REHAB PHASE I   PRE:  Rate/Rhythm: 75 SR    BP: sitting 142/64    SaO2:   MODE:  Ambulation: 700 ft   POST:  Rate/Rhythm: 101 ST    BP: sitting 158/64      SaO2:   Tolerated well. Sat and rested after 350 ft (and incline). No c/o, happy to d/c. Ed completed with good comprehension. Will send referral to [facility], ACSM 11/10/2015 8:45 AM

## 2015-11-10 NOTE — Discharge Instructions (Signed)

## 2015-11-10 NOTE — Progress Notes (Signed)
Patient Name: Rachel Clark Date of Encounter: 11/10/2015  Hospital Problem List     Active Problems:   Coronary artery disease, post CABG 2007    Dyslipidemia   Unstable angina [facility])   Chest pain   Chest pain with high risk for cardiac etiology    Subjective   Feels great this morning. States she ready to go.   Inpatient Medications    . amLODipine  10 mg Oral Daily  . angioplasty book   Does not apply Once  . aspirin EC  81 mg Oral Daily  . carbamazepine  500 mg Oral BID  . carvedilol  25 mg Oral BID WC  . clopidogrel  75 mg Oral Daily  . DULoxetine  60 mg Oral q morning - 10a  . ethosuximide  500 mg Oral BID  . ezetimibe  10 mg Oral Daily  . isosorbide mononitrate  30 mg Oral Daily  . levothyroxine  50 mcg Oral QAC breakfast  . sodium chloride flush  3 mL Intravenous Q12H    Vital Signs    Filed Vitals:   11/09/15 1704 11/09/15 1945 11/09/15 2235 11/10/15 0624  BP: 136/94 143/54 146/61 150/96  Pulse: 72 72 63 85  Temp:  98 F (36.7 C)  98 F (36.7 C)  TempSrc:  Oral  Oral  Resp: 20 27 22 15   Height:      Weight:    196 lb 3.4 oz (89 kg)  SpO2: 100% 97% 97% 97%    Intake/Output Summary (Last 24 hours) at 11/10/15 0749 Last data filed at 11/09/15 1733  Gross per 24 hour  Intake    960 ml  Output    750 ml  Net    210 ml   Filed Weights   11/05/15 1353 11/09/15 0500 11/10/15 0624  Weight: 190 lb (86.183 kg) 194 lb 3.6 oz (88.1 kg) 196 lb 3.4 oz (89 kg)    Physical Exam    General: Pleasant older female, NAD. Neuro: Alert and oriented X 3. Moves all extremities spontaneously. Psych: Normal affect. HEENT:  Normal  Neck: Supple without bruits or JVD. Lungs:  Resp regular and unlabored, CTA. Heart: RRR no s3, s4, or murmurs. Abdomen: Soft, non-tender, non-distended, BS + x 4.  Extremities: No clubbing, cyanosis or edema. DP/PT/Radials 2+ and equal bilaterally. Right groin without bruising or swelling. No bruit noted.   Labs     CBC  Recent Labs  11/08/15 0425 11/09/15 0436  WBC 8.5 7.5  HGB 12.5 11.5*  HCT 38.0 34.6*  MCV 88.4 88.5  PLT 260 223    Telemetry    SR Rate- 60-70  ECG    SR Rate-73 No acute changes.  Radiology      Assessment & Plan    1. CAD s/p CABG with unstable angina- She underwent LHC yesterday with Dr. Burt Knack with noted severe native vessel disease to the LAD, and RCA, along with continued patency of the LIMA-LAD and chronic occlusion of the SVG-RCA. RCA was successfully treated with full metal jacket stenting (drug-eluting). Recommendations for lifelong DAPT therapy.  -No chest pain/pressure, ambulated with cardiac rehab without any complaints.  -Continue aspirin, Plavix, beta blocker, nitrates and Norvasc. Intolerant to statins. Discussed the importance of diet and exercise.  2. Status post pacemaker. 3. Hyperlipidemia-intolerant to statins. Continue Zetia. Talked with patient about consider PCSK9 inhibitors, states that she does not think her insurance will cover this at this time.  4. HTN -slightly elevated this  am, but previous readings show better control.   Signed, Reino Bellis NP-C Pager [phone]  Agree with note by Reino Bellis NP-C  Pt is POD # 1 complex RCA PCI/DES with Synergy stents. Full metal jacket. Feels fine. Exam benign. Labs OK. DAPT for life. OK for DC home. ROV with Dr. [name]  Aizik Reh J. Jayma Volpi, M.D., [place], [facility], [place] [address] [place], [place]  [address]  [phone] 11/10/2015 9:35 AM

## 2015-11-12 NOTE — Telephone Encounter (Signed)
Patient contacted regarding discharge from [facility] on 11/10/15.  Patient understands to follow up with provider  Rachel HAGERon 5/26/17at 10 AM at [facility]. Patient understands discharge instructions? yes  Patient understands medications and regiment? yes  Patient understands to bring all medications to this visit? yes

## 2015-11-15 ENCOUNTER — Telehealth: Payer: Self-pay | Admitting: Cardiology

## 2015-11-15 ENCOUNTER — Encounter: Payer: BLUE CROSS/BLUE SHIELD | Admitting: *Deleted

## 2015-11-15 NOTE — Telephone Encounter (Signed)
LMOVM reminding pt to send remote transmission.   

## 2015-11-16 ENCOUNTER — Ambulatory Visit: Payer: BLUE CROSS/BLUE SHIELD | Admitting: Hematology and Oncology

## 2015-11-19 ENCOUNTER — Encounter: Payer: Self-pay | Admitting: Cardiology

## 2015-11-26 ENCOUNTER — Encounter: Payer: Self-pay | Admitting: Physician Assistant

## 2015-11-26 ENCOUNTER — Ambulatory Visit (INDEPENDENT_AMBULATORY_CARE_PROVIDER_SITE_OTHER): Payer: Medicare Other | Admitting: Physician Assistant

## 2015-11-26 VITALS — BP 84/56 | Ht 68.0 in | Wt 194.0 lb

## 2015-11-26 DIAGNOSIS — I10 Essential (primary) hypertension: Secondary | ICD-10-CM | POA: Diagnosis not present

## 2015-11-26 DIAGNOSIS — Z950 Presence of cardiac pacemaker: Secondary | ICD-10-CM

## 2015-11-26 DIAGNOSIS — E785 Hyperlipidemia, unspecified: Secondary | ICD-10-CM | POA: Diagnosis not present

## 2015-11-26 DIAGNOSIS — I2510 Atherosclerotic heart disease of native coronary artery without angina pectoris: Secondary | ICD-10-CM | POA: Diagnosis not present

## 2015-11-26 DIAGNOSIS — I2583 Coronary atherosclerosis due to lipid rich plaque: Secondary | ICD-10-CM

## 2015-11-26 NOTE — Patient Instructions (Addendum)
BP range:  110-120 over 60-70. Check 6 times over the next two weeks.  Exercise as discussed.  Ideally 60 minutes a day.    Your physician has recommended you make the following change in your medication: DECREASE amlodipine to 5mg  once daily  Your physician recommends that you schedule a follow-up appointment in [place] with Dr. [name]

## 2015-11-26 NOTE — Progress Notes (Signed)
Patient ID: Rachel Clark, female   DOB: Sep 23, 1950, 65 y.o.   MRN: [id number]    Date:  11/26/2015   ID:  Rachel Clark, DOB May 22, 1951, MRN [id number]  PCP:  Vidal Schwalbe, MD  Primary Cardiologist:  [name]   Chief Complaint  Patient presents with  . Follow-up    Some chest pain when came home from hospital but took no medicine     History of Present Illness: Rachel Clark is a 65 y.o. female with a past medical history of CAD s/p CABG in 2007, HLD, colon CA,s/p MDT PPM placed in 03/2015 for CHB who presented to the ED with c/o crushing chest pain on 11/05/2015. She was seen in the ED at that time and noted to have EKG presentation of slight ST depression in V4-V6 but this was noted to be similar to previous EKGs. She received serial enzymes which trended negative. Of note she was known to have diffuse disease throughout her RCA and was on Plavix. At that this, she was seen by Dr. Burt Knack in the ED and plan for overnight obs, it is possible discharge in the a.m. If she had no recurrent episodes of chest pain. On assessment the next day she was noted to have an episode of chest pain in the early am which was relieved with sublingual nitroglycerin. At that time per Dr. Antionette Char note, patient was kept in house and started on IV heparin with plan for PCI attempt of the RCA on Tuesday 11/09/2015. She did have intermittent brief episodes of chest pain over the weekend. On 11/10/2015 she underwent LHC with coronary stent intervention with a groin approach with Dr. Burt Knack. Cath showed severe native vessel disease to the LAD and RCA, s/p CABG with patency of the LIMA-LAD and chronic occlusion of the SVG-RCA. She had severe diffuse diease of the RCA, which was successfully treated with full metal jacket stenting with DES/synergy stents. Recommendations for lifelong DAPT and continued aggressive medical therapy. She ambulated with cardiac rehab with no complaints or complications.   She presents for  posthospital follow-up.  She reports doing well from his been a little bit fatigued and groggy. A few days after discharge she's felt a couple twinges in her chest and wooziness but that has essentially resolved. She been riding her exercise bike not every day, but when she does, it's for 30 minutes.  The patient currently denies nausea, vomiting, fever, chest pain, shortness of breath, orthopnea, dizziness, PND, cough, congestion, abdominal pain, hematochezia, melena, lower extremity edema, claudication.  Wt Readings from Last 3 Encounters:  11/26/15 194 lb (87.998 kg)  11/10/15 196 lb 3.4 oz (89 kg)  08/25/15 194 lb 12.8 oz (88.361 kg)     Past Medical History  Diagnosis Date  . Coronary artery disease     a. remote NSTEMI with PCI to the RCA; b. S/P CABG in 2007; c. 2010 - 2 v CAD with L-LAD and S-RCA patent, small caliber Dx 70% (treated medically - no amenable to PCI), CFX free of significant disease, normal LVF. d. 10/2014 Lexi MV: EF 61%, no ischemia/infarct.  . Dyslipidemia   . Monoclonal gammopathy of unknown significance   . Colon cancer ([place]) 12/04/2011    s/p Laparoscopic-assisted transverse colectomy on 12/19/2011 by Dr. Donne Hazel.  pT3 N0 M0.   Marland Kitchen Hyperlipidemia   . Fibromyalgia   . GERD (gastroesophageal reflux disease)   . Gastric ulcer   . Hypothyroid   . Depression   . Iron deficiency  anemia 11/17/2011  . 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 Myocardial infarction ([place])   . Unstable angina ([place])   . Syncope and collapse     pacemaker implanted    Current Outpatient Prescriptions  Medication Sig Dispense Refill  . acetaminophen (TYLENOL) 500 MG tablet Take 500-1,000 mg by mouth daily as needed for mild pain or headache.    Marland Kitchen amLODipine (NORVASC) 10 MG tablet Take 5 mg by mouth daily.    Marland Kitchen aspirin EC 81 MG EC tablet Take 1 tablet (81 mg total) by mouth daily.    . Azelastine HCl 0.15 % SOLN Place 1 spray  into both nostrils daily as needed (seasonal allergies).   12  . carbamazepine (TEGRETOL XR) 100 MG 12 hr tablet TAKE FIVE TABLETS BY MOUTH TWICE DAILY (Patient taking differently: Take 500 mg by mouth 2 (two) times daily. ) 900 tablet 2  . carvedilol (COREG) 25 MG tablet Take 1 tablet (25 mg total) by mouth 2 (two) times daily with a meal. 60 tablet 11  . clopidogrel (PLAVIX) 75 MG tablet Take 1 tablet (75 mg total) by mouth daily. KEEP OV. 90 tablet 0  . DULoxetine (CYMBALTA) 60 MG capsule Take 60 mg by mouth daily.     Marland Kitchen estradiol (VIVELLE-DOT) 0.05 MG/24HR patch Place 1 patch onto the skin 2 (two) times a week. Sunday, Wednesday    . ethosuximide (ZARONTIN) 250 MG capsule Take 1 capsule (250mg ) in the morning, 2 capsules (500mg ) in the afternoon, and 2 capsules (500mg ) at night (Patient taking differently: Take 500 mg by mouth 2 (two) times daily. ) 150 capsule 0  . ezetimibe (ZETIA) 10 MG tablet Take 1 tablet (10 mg total) by mouth daily. KEEP OV. 90 tablet 0  . isosorbide mononitrate (IMDUR) 30 MG 24 hr tablet Take 1 tablet (30 mg total) by mouth daily. 30 tablet 11  . levothyroxine (SYNTHROID, LEVOTHROID) 50 MCG tablet Take 50 mcg by mouth daily.     . nitroGLYCERIN (NITROSTAT) 0.4 MG SL tablet Place 1 tablet (0.4 mg total) under the tongue every 5 (five) minutes as needed. For chest pain. (Patient taking differently: Place 0.4 mg under the tongue every 5 (five) minutes as needed for chest pain. ) 25 tablet 6  . tiZANidine (ZANAFLEX) 2 MG tablet Take 4 tablets by mouth at bedtime.   2   No current facility-administered medications for this visit.    Allergies:    Allergies  Allergen Reactions  . Aspirin Other (See Comments)    GI upset  . Erythromycin Nausea Only    Gi upset  . Lisinopril Cough  . Niacin And Related     Pt does not recall reactions   . Statins     Muscle pain  . Sulfa Drugs Cross Reactors Swelling  . Tetracyclines & Related Nausea Only  . Penicillins Nausea Only  and Rash    Has patient had a PCN reaction causing immediate rash, facial/tongue/throat swelling, SOB or lightheadedness with hypotension: Yes Has patient had a PCN reaction causing severe rash involving mucus membranes or skin necrosis: No Has patient had a PCN reaction that required hospitalization No Has patient had a PCN reaction occurring within the last 10 years: No If all of the above answers are "NO", then may proceed with Cephalosporin use.     Social History:  The patient  reports that she quit smoking about 34 years  ago. Her smoking use included Cigarettes. She has a 6 pack-year smoking history. She has never used smokeless tobacco. She reports that she drinks about 0.6 oz of alcohol per week. She reports that she does not use illicit drugs.   Family history:   Family History  Problem Relation Age of Onset  . Heart attack Father   . Heart disease Father   . Heart attack Brother 16  . Cancer Mother     lung  . Stroke Neg Hx     ROS:  Please see the history of present illness.  All other systems reviewed and negative.   PHYSICAL EXAM: VS:  BP 84/56 mmHg  Ht 5\' 8"  (1.727 m)  Wt 194 lb (87.998 kg)  BMI 29.50 kg/m2 Well nourished, well developed, in no acute distress HEENT: Pupils are equal round react to light accommodation extraocular movements are intact.  Neck: no JVDNo cervical lymphadenopathy. Cardiac: Regular rate and rhythm without murmurs rubs or gallops. Lungs:  clear to auscultation bilaterally, no wheezing, rhonchi or rales Abd: soft, nontender, positive bowel sounds all quadrants, no hepatosplenomegaly Ext: no lower extremity edema.  2+ radial and dorsalis pedis pulses. Skin: warm and dry Neuro:  Grossly normal  EKG:  Normal sinus rhythm rate 70 bpm first-degree AV block.    ASSESSMENT AND PLAN:  Problem List Items Addressed This Visit    Essential hypertension   Relevant Medications   amLODipine (NORVASC) 10 MG tablet   Dyslipidemia - Primary  (Chronic)   Coronary artery disease, post CABG 2007  (Chronic)   Relevant Medications   amLODipine (NORVASC) 10 MG tablet   Cardiac pacemaker in situ (Chronic)      Rachel Clark to be doing well since she's had essentially complete stenting of her right coronary artery. She's taking Plavix as directed.  She does report some twinges in her chest when after discharge but resolved and whooziness.  She has been riding her exercise bike approximately 30 minutes but not every day.  We discussed increasing this routine slowly over time and also having some intervals which we discussed how to do.  I did recommend going to cardiac rehabilitation phase II first however, this does not appear to be an option for her.   In the office her blood pressure is 84/56. Decrease amlodipine to 5 mg.  She does not have a blood pressure cuff at home. I recommend she go to [facility] at least 3 times a week for the next couple weeks and monitor it. I did tell her what range her blood pressure should normally be in. Continue Coreg, Zetia, Imdur.  She is a statin allergy.   Follow-up in 3 months with Dr. [name].

## 2015-12-02 ENCOUNTER — Ambulatory Visit: Payer: BLUE CROSS/BLUE SHIELD | Admitting: Cardiology

## 2016-01-08 ENCOUNTER — Other Ambulatory Visit: Payer: Self-pay | Admitting: Cardiology

## 2016-01-10 ENCOUNTER — Other Ambulatory Visit: Payer: Self-pay | Admitting: Cardiology

## 2016-01-10 NOTE — Telephone Encounter (Signed)
Rx request sent to pharmacy.  

## 2016-02-09 ENCOUNTER — Other Ambulatory Visit: Payer: Self-pay

## 2016-02-09 MED ORDER — ETHOSUXIMIDE 250 MG PO CAPS: ORAL_CAPSULE | ORAL | 11 refills | Status: DC

## 2016-02-22 ENCOUNTER — Telehealth: Payer: Self-pay | Admitting: Neurology

## 2016-02-22 ENCOUNTER — Ambulatory Visit: Payer: BLUE CROSS/BLUE SHIELD | Admitting: Neurology

## 2016-02-22 NOTE — Telephone Encounter (Signed)
This patient canceled same day of the appointment. The patient had to take her son to the emergency room.

## 2016-02-25 ENCOUNTER — Encounter: Payer: Self-pay | Admitting: Neurology

## 2016-03-22 NOTE — Progress Notes (Signed)
Cardiology Office Note   Date:  03/23/2016   ID:  NYLAYAH HAWA, DOB [birth year], MRN [id number]  PCP:  Vidal Schwalbe, MD  Cardiologist:  Dr. Irva Loser Clark     History of Present Illness: Rachel Clark is a 65 y.o. female with a past medical history of CAD s/p CABG in 2007, HLD, colon CA, s/p MDT PPM placed in 03/2015 for CHB who presented to the ED with c/o crushing chest pain on 11/05/2015. She was seen in the ED at that time and noted to have EKG presentation of slight ST depression in V4-V6 but this was noted to be similar to previous EKGs. She received serial enzymes which trended negative. Of note she was known to have diffuse disease throughout her RCA and was on Plavix.  At that time per Dr. Antionette Char note, patient was kept in house and started on IV heparin with plan for PCI attempt of the RCA on Tuesday 11/09/2015. On 11/10/2015 she underwent LHC. Cath showed severe native vessel disease to the LAD and RCA, s/p CABG with patency of the LIMA-LAD and chronic occlusion of the SVG-RCA. She had severe diffuse diease of the RCA, which was successfully treated with full metal jacket stenting with DES/synergy stents. Recommendations for lifelong DAPT and continued aggressive medical therapy.   She returns today for FU.  She is doing very well from a cardiac standpoint.  No significant angina. She did develop a bad cough the day before yesterday with some chest pain on coughing or breathing. No fever or chills. She does have sinus congestion. She is intolerant to all statins. She is taking Zetia.    Studies: Echo: Jan 2017:Study Conclusions  - Left ventricle: The cavity size was normal. There was mild   concentric hypertrophy. Systolic function was normal. The   estimated ejection fraction was in the range of 60% to 65%. Wall   motion was normal; there were no regional wall motion   abnormalities. Left ventricular diastolic function parameters   were normal. - Mitral valve: There was  mild regurgitation. - Atrial septum: No defect or patent foramen ovale was identified   bu color flow Doppler. - Tricuspid valve: There was trivial regurgitation. - Inferior vena cava: The vessel was normal in size. The   respirophasic diameter changes were in the normal range (>= 50%),   consistent with normal central venous pressure. - Global longitudinal strain -18.6%.  Procedures 11/09/15  Coronary Stent Intervention  Left Heart Cath and Coronary Angiography  Conclusion    Ramus lesion, 30% stenosed.  Ost LAD to Prox LAD lesion, 95% stenosed.  Mid RCA-1 lesion, 95% stenosed.  Mid RCA-2 lesion, 90% stenosed.  SVG .  Origin lesion, 100% stenosed.  Dist LAD-1 lesion, 70% stenosed.  Dist LAD-2 lesion, 50% stenosed.  Ost RCA to Prox RCA lesion, 90% stenosed. Post intervention, there is a 0% residual stenosis. The lesion was previously treated with a stent (unknown type).   1. Severe native vessel disease of the LAD and RCA 2. S/P CABG with continued patency of the LIMA-LAD and chronic occlusion of the SVG-RCA 3. Severe diffuse disease of the RCA, treated successfully with full-metal jacket stenting (drug-eluting)  Lifelong DAPT as tolerated. Continue aggressive medical therapy. Anticipate discharge tomorrow as long as clinically stable.         Recent Labs: 03/24/2015: TSH 0.496 11/05/2015: ALT 15 11/06/2015: LDL Cholesterol 132 11/10/2015: BUN 9; Creatinine, Ser 0.68; Hemoglobin 12.6; Potassium 4.2; Sodium 139   CrCl cannot  be calculated (Patient's most recent lab result is older than the maximum 21 days allowed.).    Recent Radiology: No results found.    Wt Readings from Last 3 Encounters:  03/23/16 193 lb 3.2 oz (87.6 kg)  11/26/15 194 lb (88 kg)  11/10/15 196 lb 3.4 oz (89 kg)     Past Medical History:  Diagnosis Date  . Colon cancer ([place]) 12/04/2011   s/p Laparoscopic-assisted transverse colectomy on 12/19/2011 by Dr. Donne Hazel.  pT3 N0 M0.   . Coronary  artery disease    a. remote NSTEMI with PCI to the RCA; b. S/P CABG in 2007; c. 2010 - 2 v CAD with L-LAD and S-RCA patent, small caliber Dx 70% (treated medically - no amenable to PCI), CFX free of significant disease, normal LVF. d. 10/2014 Lexi MV: EF 61%, no ischemia/infarct.  . Depression   . Dyslipidemia   . Fibromyalgia   . Gastric ulcer   . GERD (gastroesophageal reflux disease)   . 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 Hyperlipidemia   . Hypothyroid   . Iron deficiency anemia 11/17/2011  . Monoclonal gammopathy of unknown significance   . Myocardial infarction ([place])   . Syncope and collapse    pacemaker implanted  . Unstable angina [facility])     Current Outpatient Prescriptions  Medication Sig Dispense Refill  . acetaminophen (TYLENOL) 500 MG tablet Take 500-1,000 mg by mouth daily as needed for mild pain or headache.    Marland Kitchen amLODipine (NORVASC) 10 MG tablet Take 5 mg by mouth daily.    Marland Kitchen aspirin EC 81 MG EC tablet Take 1 tablet (81 mg total) by mouth daily.    . Azelastine HCl 0.15 % SOLN Place 1 spray into both nostrils daily as needed (seasonal allergies).   12  . carbamazepine (TEGRETOL XR) 100 MG 12 hr tablet TAKE FIVE TABLETS BY MOUTH TWICE DAILY (Patient taking differently: Take 500 mg by mouth 2 (two) times daily. ) 900 tablet 2  . carvedilol (COREG) 25 MG tablet Take 1 tablet (25 mg total) by mouth 2 (two) times daily with a meal. 60 tablet 11  . clopidogrel (PLAVIX) 75 MG tablet TAKE 1 TABLET DAILY KEEP OFFICE VISIT 90 tablet 1  . DULoxetine (CYMBALTA) 60 MG capsule Take 60 mg by mouth daily.     Marland Kitchen estradiol (VIVELLE-DOT) 0.05 MG/24HR patch Place 1 patch onto the skin 2 (two) times a week. Sunday, Wednesday    . ethosuximide (ZARONTIN) 250 MG capsule Take 1 capsule (250mg ) in the morning, 2 capsules (500mg ) in the afternoon, and 2 capsules (500mg ) at night 150 capsule 11  . ezetimibe (ZETIA) 10 MG tablet Take 1  tablet by mouth once daily.  90 tablet 3  . isosorbide mononitrate (IMDUR) 30 MG 24 hr tablet Take 1 tablet (30 mg total) by mouth daily. 30 tablet 11  . levothyroxine (SYNTHROID, LEVOTHROID) 50 MCG tablet Take 50 mcg by mouth daily.     . nitroGLYCERIN (NITROSTAT) 0.4 MG SL tablet Place 1 tablet (0.4 mg total) under the tongue every 5 (five) minutes as needed. For chest pain. (Patient taking differently: Place 0.4 mg under the tongue every 5 (five) minutes as needed for chest pain. ) 25 tablet 6  . tiZANidine (ZANAFLEX) 2 MG tablet Take 4 tablets by mouth at bedtime.   2   No current facility-administered medications for this visit.      Allergies:  Aspirin; Erythromycin; Lisinopril; Niacin and related; Statins; Sulfa drugs cross reactors; Tetracyclines & related; and Penicillins   Social History:  The patient  reports that she quit smoking about 34 years ago. Her smoking use included Cigarettes. She has a 6.00 pack-year smoking history. She has never used smokeless tobacco. She reports that she drinks about 0.6 oz of alcohol per week . She reports that she does not use drugs.   Family History:  The patient's family history includes Cancer in her mother; Heart attack in her father; Heart attack (age of onset: 98) in her brother; Heart disease in her father.    ROS:  Please see the history of present illness.       All other systems reviewed and negative.    PHYSICAL EXAM: VS:  BP 127/84   Pulse 86   Ht 5' 8.5" (1.74 m)   Wt 193 lb 3.2 oz (87.6 kg)   SpO2 97%   BMI 28.95 kg/m  Well nourished, well developed, in no acute distress  HEENT: normal  Neck: no JVD  Carotids:  No bruits bilat Cardiac:  normal S1, S2;  RRR;  no murmur   Lungs:   Very coarse bilateral rhonchi and wheezing.  Abd: soft, nontender, no hepatomegaly  Ext:  no edema  Skin: warm and dry  Neuro:  CNs 2-12 intact, no focal abnormalities noted  EKG:    Lab Results  Component Value Date   WBC 6.7 11/10/2015    HGB 12.6 11/10/2015   HCT 38.0 11/10/2015   PLT 257 11/10/2015   GLUCOSE 174 (H) 11/10/2015   CHOL 232 (H) 11/06/2015   TRIG 102 11/06/2015   HDL 80 11/06/2015   LDLCALC 132 (H) 11/06/2015   ALT 15 11/05/2015   AST 14 (L) 11/05/2015   NA 139 11/10/2015   K 4.2 11/10/2015   CL 102 11/10/2015   CREATININE 0.68 11/10/2015   BUN 9 11/10/2015   CO2 26 11/10/2015   TSH 0.496 03/24/2015   INR 1.11 11/09/2015   HGBA1C 5.9 (H) 03/24/2015      ASSESSMENT AND PLAN:   1.  CAD s/p CABG:  Admitted in May 2017 with unstable angina. SVG to RCA occluded with severe native RCA disease (essentially CTO). S/p extensive stenting of the native RCA with DES. No angina.  Continue DAPT indefinitely.   She is intol to statins.    2.  Hyperlipidemia:  She is intol to statins.  11/05/2015: ALT 15 11/06/2015: HDL 80; LDL Cholesterol 132    Continue Zetia. Does not want to try a PCSK9 inhibitor.  3. Hypertension- controlled. 4. Acute bronchitis. Will treat with Keflex 250 mg qid for 7 days. Multiple antibiotic allergies.    Disposition:   FU with me in 6 months.   Signed, Rachel Fine [name] MD, [facility]    03/23/2016 10:11 AM    Rachel Clark, [place], [place]  [address] Phone: [phone]; Fax: [phone]

## 2016-03-23 ENCOUNTER — Other Ambulatory Visit: Payer: Self-pay

## 2016-03-23 ENCOUNTER — Ambulatory Visit (INDEPENDENT_AMBULATORY_CARE_PROVIDER_SITE_OTHER): Payer: BLUE CROSS/BLUE SHIELD | Admitting: Cardiology

## 2016-03-23 ENCOUNTER — Encounter: Payer: Self-pay | Admitting: Cardiology

## 2016-03-23 VITALS — BP 127/84 | HR 86 | Ht 68.5 in | Wt 193.2 lb

## 2016-03-23 DIAGNOSIS — I25810 Atherosclerosis of coronary artery bypass graft(s) without angina pectoris: Secondary | ICD-10-CM | POA: Diagnosis not present

## 2016-03-23 DIAGNOSIS — I2510 Atherosclerotic heart disease of native coronary artery without angina pectoris: Secondary | ICD-10-CM

## 2016-03-23 DIAGNOSIS — E785 Hyperlipidemia, unspecified: Secondary | ICD-10-CM

## 2016-03-23 DIAGNOSIS — J209 Acute bronchitis, unspecified: Secondary | ICD-10-CM | POA: Insufficient documentation

## 2016-03-23 DIAGNOSIS — I10 Essential (primary) hypertension: Secondary | ICD-10-CM

## 2016-03-23 MED ORDER — CEPHALEXIN 250 MG PO CAPS: 250.0000 mg | ORAL_CAPSULE | Freq: Four times a day (QID) | ORAL | Status: DC

## 2016-03-23 MED ORDER — CEPHALEXIN 250 MG PO CAPS: ORAL_CAPSULE | ORAL | 0 refills | Status: DC

## 2016-03-23 NOTE — Patient Instructions (Signed)
Continue your current therapy  Take Cipro 500 mg twice a day for 7 days  I will see you in 6 months.

## 2016-04-11 ENCOUNTER — Other Ambulatory Visit: Payer: Self-pay | Admitting: Neurology

## 2016-04-27 ENCOUNTER — Encounter: Payer: Self-pay | Admitting: Adult Health

## 2016-04-27 ENCOUNTER — Ambulatory Visit (INDEPENDENT_AMBULATORY_CARE_PROVIDER_SITE_OTHER): Payer: BLUE CROSS/BLUE SHIELD | Admitting: Adult Health

## 2016-04-27 VITALS — BP 116/64 | HR 70 | Resp 18 | Ht 68.5 in | Wt 195.0 lb

## 2016-04-27 DIAGNOSIS — R569 Unspecified convulsions: Secondary | ICD-10-CM | POA: Diagnosis not present

## 2016-04-27 DIAGNOSIS — Z5181 Encounter for therapeutic drug level monitoring: Secondary | ICD-10-CM | POA: Diagnosis not present

## 2016-04-27 MED ORDER — CARBAMAZEPINE ER 100 MG PO TB12: ORAL_TABLET | ORAL | 11 refills | Status: DC

## 2016-04-27 MED ORDER — ETHOSUXIMIDE 250 MG PO CAPS: ORAL_CAPSULE | ORAL | 11 refills | Status: DC

## 2016-04-27 NOTE — Patient Instructions (Signed)
Continue carbamazepine and zarontin Blood work today If you have any seizure events please let us know.

## 2016-04-27 NOTE — Progress Notes (Signed)
I have read the note, and I agree with the clinical assessment and plan.  WILLIS,CHARLES KEITH   

## 2016-04-27 NOTE — Progress Notes (Signed)
PATIENT: Rachel Clark DOB: 1950/09/30  REASON FOR VISIT: follow up- seizures HISTORY FROM: patient  HISTORY OF PRESENT ILLNESS: Rachel Clark is a 65 year old female with a history of seizures. She returns today for follow-up. She is currently on carbamazepine and Zarontin. She denies any seizure events. She states that she has been doing well. She lives at home with her husband. She is able to complete all ADLs independently. She operates a Teacher, music without difficulty. Denies any changes with her gait or balance. She is not had any additional cardiac issues since last visit. Denies any syncopal events. She returns today for an evaluation.   HISTORY 08/10/15: Rachel Clark is a 65 year old right-handed white female with a history of seizures, the last seizure occurred at age 42. The patient is on carbamazepine and ethosuxamide for her seizures. The patient has not wanted to give a trial off of her seizure medications, she wishes to continue her medications. The patient has been operating a motor vehicle. Within the last 6 weeks, the patient began having episodes of dizziness and a single episode of syncope was noted. The patient would have a flushing sensation starting at the knees going up on the body to the head, the patient will look flushed in the face, she would experience some chest pain. If she would lie down, the symptoms may improve. The patient would take nitroglycerin for these episodes with improvement. The patient recently was in the hospital around 07/25/2015. The patient was found to have coronary artery disease, and her medications were adjusted. The patient has done much better, she has not had any events of dizziness or syncope or near syncope since the hospitalization. The patient had a pacemaker placed in September 2016 secondary to episodes of bradycardia. The interrogation of the pacemaker according to the patient showed no definite cardiac rhythm abnormalities have  explained the episodes of dizziness, chest pain, and syncope. The patient was told that she may have vasovagal syncope, she is sent to this office for an evaluation. The patient denied any problems with feeling diaphoretic, nauseated, or any issues with the sensation of needing to have a bowel movement. The patient indicates that many of her episodes occurred while sitting. She was having events about every other day prior to the hospitalization, she has gone 2 weeks without any events now. She indicates that she did sense an increased heart rate during the events. She denies any focal numbness or weakness of the face, arms, or legs. She denies headache or visual field disturbances or speech disturbances.   REVIEW OF SYSTEMS: Out of a complete 14 system review of symptoms, the patient complains only of the following symptoms, and all other reviewed systems are negative.  See history of present illness  ALLERGIES: Allergies  Allergen Reactions  . Aspirin Other (See Comments)    GI upset  . Erythromycin Nausea Only    Gi upset  . Lisinopril Cough  . Niacin And Related     Pt does not recall reactions   . Statins     Muscle pain  . Sulfa Drugs Cross Reactors Swelling  . Tetracyclines & Related Nausea Only  . Penicillins Nausea Only and Rash    .     HOME MEDICATIONS: Outpatient Medications Prior to Visit  Medication Sig Dispense Refill  . acetaminophen (TYLENOL) 500 MG tablet Take 500-1,000 mg by mouth daily as needed for mild pain or headache.    Marland Kitchen amLODipine (NORVASC) 10 MG tablet Take 5  mg by mouth daily.    Marland Kitchen aspirin EC 81 MG EC tablet Take 1 tablet (81 mg total) by mouth daily.    . Azelastine HCl 0.15 % SOLN Place 1 spray into both nostrils daily as needed (seasonal allergies).   12  . carbamazepine (TEGRETOL XR) 100 MG 12 hr tablet TAKE 5 TABLETS TWICE A DAY 300 tablet 0  . carvedilol (COREG) 25 MG tablet Take 1 tablet (25 mg total) by mouth 2 (two) times daily with a meal. 60  tablet 11  . cephALEXin (KEFLEX) 250 MG capsule Take 250 mg four times a day for 7 days 28 capsule 0  . DULoxetine (CYMBALTA) 60 MG capsule Take 60 mg by mouth daily.     Marland Kitchen estradiol (VIVELLE-DOT) 0.05 MG/24HR patch Place 1 patch onto the skin 2 (two) times a week. Sunday, Wednesday    . ethosuximide (ZARONTIN) 250 MG capsule Take 1 capsule (250mg ) in the morning, 2 capsules (500mg ) in the afternoon, and 2 capsules (500mg ) at night 150 capsule 11  . ezetimibe (ZETIA) 10 MG tablet Take 1 tablet by mouth once daily.  90 tablet 3  . isosorbide mononitrate (IMDUR) 30 MG 24 hr tablet Take 1 tablet (30 mg total) by mouth daily. 30 tablet 11  . levothyroxine (SYNTHROID, LEVOTHROID) 50 MCG tablet Take 50 mcg by mouth daily.     . nitroGLYCERIN (NITROSTAT) 0.4 MG SL tablet Place 1 tablet (0.4 mg total) under the tongue every 5 (five) minutes as needed. For chest pain. (Patient taking differently: Place 0.4 mg under the tongue every 5 (five) minutes as needed for chest pain. ) 25 tablet 6  . tiZANidine (ZANAFLEX) 2 MG tablet Take 4 tablets by mouth at bedtime.   2  . clopidogrel (PLAVIX) 75 MG tablet TAKE 1 TABLET DAILY KEEP OFFICE VISIT (Patient not taking: Reported on 04/27/2016) 90 tablet 1   No facility-administered medications prior to visit.     PAST MEDICAL HISTORY: Past Medical History:  Diagnosis Date  . Colon cancer ([place]) 12/04/2011   s/p Laparoscopic-assisted transverse colectomy on 12/19/2011 by Dr. Donne Hazel.  pT3 N0 M0.   . Coronary artery disease    a. remote NSTEMI with PCI to the RCA; b. S/P CABG in 2007; c. 2010 - 2 v CAD with L-LAD and S-RCA patent, small caliber Dx 70% (treated medically - no amenable to PCI), CFX free of significant disease, normal LVF. d. 10/2014 Lexi MV: EF 61%, no ischemia/infarct.  . Depression   . Dyslipidemia   . Fibromyalgia   . Gastric ulcer   . GERD (gastroesophageal reflux disease)   . 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 Hyperlipidemia   . Hypothyroid   . Iron deficiency anemia 11/17/2011  . Monoclonal gammopathy of unknown significance   . Myocardial infarction   . Syncope and collapse    pacemaker implanted  . Unstable angina ([place])     PAST SURGICAL HISTORY: Past Surgical History:  Procedure Laterality Date  . ANTERIOR CERVICAL DECOMP/DISCECTOMY FUSION  1980's  . BACK SURGERY    . CARDIAC CATHETERIZATION  05/20/2009   obstructive native vessel disease in LAD, RCA, and first diagonal, patent vein graft to distal RCA and LIMA to LAD,normal. ef 60%  . CARDIAC CATHETERIZATION N/A 03/24/2015   Procedure: Left Heart Cath and Cors/Grafts Angiography;  Surgeon: Wellington Hampshire, MD;  Location: [facility];  Service: Cardiovascular;  Laterality:  N/A;  . CARDIAC CATHETERIZATION N/A 07/28/2015   Procedure: Left Heart Cath and Coronary Angiography;  Surgeon: Troy Sine, MD;  Location: [facility];  Service: Cardiovascular;  Laterality: N/A;  . CARDIAC CATHETERIZATION N/A 11/09/2015   Procedure: Coronary Stent Intervention;  Surgeon: Sherren Mocha, MD;  Location: [facility];  Service: Cardiovascular;  Laterality: N/A;  . CARDIAC CATHETERIZATION N/A 11/09/2015   Procedure: Left Heart Cath and Coronary Angiography;  Surgeon: Sherren Mocha, MD;  Location: [facility];  Service: Cardiovascular;  Laterality: N/A;  . COLON RESECTION  12/19/2011   Procedure: COLON RESECTION LAPAROSCOPIC;  Surgeon: Rolm Bookbinder, MD;  Location: [facility];  Service: General;  Laterality: N/A;  laparoscopic hand assisted partial colon resection  . COLON SURGERY    . COLONOSCOPY    . CORONARY ANGIOPLASTY WITH STENT PLACEMENT  ~ 2007   1 stent  . CORONARY ARTERY BYPASS GRAFT  2007   x 2  . DILATION AND CURETTAGE OF UTERUS  1973  . EP IMPLANTABLE DEVICE N/A 03/25/2015   MDT Advisa DR pacemaker implanted by Dr Rayann Heman for transient complete heart block and syncope  . PORT-A-CATH  REMOVAL N/A 06/12/2013   Procedure: REMOVAL PORT-A-CATH;  Surgeon: Rolm Bookbinder, MD;  Location: [facility];  Service: General;  Laterality: N/A;  . PORTACATH PLACEMENT  06/04/2012   Procedure: INSERTION PORT-A-CATH;  Surgeon: Rolm Bookbinder, MD;  Location: [facility];  Service: General;  Laterality: N/A;  Insertion of port-a-cath   . POSTERIOR LAMINECTOMY / DECOMPRESSION CERVICAL SPINE     20+yrs ago  . VAGINAL HYSTERECTOMY      FAMILY HISTORY: Family History  Problem Relation Age of Onset  . Heart attack Father   . Heart disease Father   . Heart attack Brother 21  . Cancer Mother     lung  . Stroke Neg Hx     SOCIAL HISTORY: Social History   Social History  . Marital status: Married    Spouse name: N/A  . Number of children: 2  . Years of education: 12   Occupational History  . 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 Smoking status: Former Smoker    Packs/day: 0.30    Years: 20.00    Types: Cigarettes    Quit date: 06/06/1981  . Smokeless tobacco: Never Used  . Alcohol use 0.6 oz/week    1 Glasses of wine per week  . Drug use: No  . Sexual activity: Yes    Birth control/ protection: Surgical   Other Topics Concern  . Not on file   Social History Narrative   Patient is married with 2 children.   Patient is right handed.   Patient has a high school education with some college education.   Patient drinks 2 cups daily.      PHYSICAL EXAM  Vitals:   04/27/16 1336  BP: 116/64  Pulse: 70  Resp: 18  Weight: 195 lb (88.5 kg)  Height: 5' 8.5" (1.74 m)   Body mass index is 29.22 kg/m.  Generalized: Well developed, in no acute distress   Neurological examination  Mentation: Alert oriented to time, place, history taking. Follows all commands speech and language fluent Cranial nerve II-XII: Pupils were equal round reactive to light. Extraocular movements were full, visual field were full on  confrontational test. Facial sensation and strength were normal. Uvula tongue midline. Head turning and shoulder shrug  were normal and symmetric. Motor: The motor testing reveals 5 over 5 strength of all 4 extremities. Good symmetric motor tone is noted throughout.  Sensory: Sensory testing is intact to soft touch on all 4 extremities. No evidence of extinction is noted.  Coordination: Cerebellar testing reveals good finger-nose-finger and heel-to-shin bilaterally.  Gait and station: Gait is normal. Tandem gait is normal. Romberg is negative. No drift is seen.  Reflexes: Deep tendon reflexes are symmetric and normal bilaterally.   DIAGNOSTIC DATA (LABS, IMAGING, TESTING) - I reviewed patient records, labs, notes, testing and imaging myself where available.  Lab Results  Component Value Date   WBC 6.7 11/10/2015   HGB 12.6 11/10/2015   HCT 38.0 11/10/2015   MCV 90.3 11/10/2015   PLT 257 11/10/2015      Component Value Date/Time   NA 139 11/10/2015 0739   NA 134 (L) 11/03/2014 1415   K 4.2 11/10/2015 0739   K 4.3 11/03/2014 1415   CL 102 11/10/2015 0739   CL 106 12/25/2012 0924   CO2 26 11/10/2015 0739   CO2 25 11/03/2014 1415   GLUCOSE 174 (H) 11/10/2015 0739   GLUCOSE 106 11/03/2014 1415   GLUCOSE 105 (H) 12/25/2012 0924   BUN 9 11/10/2015 0739   BUN 15.5 11/03/2014 1415   CREATININE 0.68 11/10/2015 0739   CREATININE 0.65 04/05/2015 1634   CREATININE 0.7 11/03/2014 1415   CALCIUM 8.8 (L) 11/10/2015 0739   CALCIUM 9.3 11/03/2014 1415   PROT 6.6 11/05/2015 1507   PROT 7.7 11/03/2014 1415   ALBUMIN 3.5 11/05/2015 1507   ALBUMIN 3.9 11/03/2014 1415   AST 14 (L) 11/05/2015 1507   AST 14 11/03/2014 1415   ALT 15 11/05/2015 1507   ALT 18 11/03/2014 1415   ALKPHOS 61 11/05/2015 1507   ALKPHOS 103 11/03/2014 1415   BILITOT 0.5 11/05/2015 1507   BILITOT 0.25 11/03/2014 1415   GFRNONAA >60 11/10/2015 0739   GFRAA >60 11/10/2015 0739   Lab Results  Component Value Date    CHOL 232 (H) 11/06/2015   HDL 80 11/06/2015   LDLCALC 132 (H) 11/06/2015   TRIG 102 11/06/2015   CHOLHDL 2.9 11/06/2015   Lab Results  Component Value Date   HGBA1C 5.9 (H) 03/24/2015    Lab Results  Component Value Date   TSH 0.496 03/24/2015      ASSESSMENT AND PLAN 65 y.o. 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). here with :  1. Seizures  Overall the patient is doing well. She will continue on Zarontin and carbamazepine. I'll check blood work today. She has any additional seizure event she should let us know. Follow-up in 6 months or sooner if needed.   Ward Givens, MSN, NP-C 04/27/2016, 1:45 PM [facility] [address] [place], [place] [address] [phone]

## 2016-04-29 LAB — CBC WITH DIFFERENTIAL/PLATELET
BASOS ABS: 0 10*3/uL (ref 0.0–0.2)
BASOS: 0 %
EOS (ABSOLUTE): 0.3 10*3/uL (ref 0.0–0.4)
Eos: 5 %
HEMOGLOBIN: 13.5 g/dL (ref 11.1–15.9)
Hematocrit: 40.2 % (ref 34.0–46.6)
IMMATURE GRANS (ABS): 0 10*3/uL (ref 0.0–0.1)
IMMATURE GRANULOCYTES: 0 %
LYMPHS: 40 %
Lymphocytes Absolute: 2.5 10*3/uL (ref 0.7–3.1)
MCH: 29.8 pg (ref 26.6–33.0)
MCHC: 33.6 g/dL (ref 31.5–35.7)
MCV: 89 fL (ref 79–97)
MONOCYTES: 9 %
Monocytes Absolute: 0.6 10*3/uL (ref 0.1–0.9)
NEUTROS ABS: 2.9 10*3/uL (ref 1.4–7.0)
NEUTROS PCT: 46 %
PLATELETS: 273 10*3/uL (ref 150–379)
RBC: 4.53 x10E6/uL (ref 3.77–5.28)
RDW: 13.4 % (ref 12.3–15.4)
WBC: 6.3 10*3/uL (ref 3.4–10.8)

## 2016-04-29 LAB — COMPREHENSIVE METABOLIC PANEL
ALT: 14 IU/L (ref 0–32)
AST: 10 IU/L (ref 0–40)
Albumin/Globulin Ratio: 1.4 (ref 1.2–2.2)
Albumin: 4.2 g/dL (ref 3.6–4.8)
Alkaline Phosphatase: 98 IU/L (ref 39–117)
BUN/Creatinine Ratio: 23 (ref 12–28)
BUN: 15 mg/dL (ref 8–27)
Bilirubin Total: <0.2 mg/dL (ref 0.0–1.2)
CALCIUM: 9.3 mg/dL (ref 8.7–10.3)
CO2: 28 mmol/L (ref 18–29)
CREATININE: 0.66 mg/dL (ref 0.57–1.00)
Chloride: 99 mmol/L (ref 96–106)
GFR, EST AFRICAN AMERICAN: 106 mL/min/{1.73_m2} (ref >59)
GFR, EST NON AFRICAN AMERICAN: 92 mL/min/{1.73_m2} (ref >59)
GLUCOSE: 106 mg/dL — AB (ref 65–99)
Globulin, Total: 2.9 g/dL (ref 1.5–4.5)
Potassium: 5.2 mmol/L (ref 3.5–5.2)
Sodium: 139 mmol/L (ref 134–144)
TOTAL PROTEIN: 7.1 g/dL (ref 6.0–8.5)

## 2016-04-29 LAB — CARBAMAZEPINE LEVEL, TOTAL: CARBAMAZEPINE LVL: 9.7 ug/mL (ref 4.0–12.0)

## 2016-04-29 LAB — ETHOSUXIMIDE LEVEL: Ethosuximide Lvl: 65 ug/mL (ref 40–100)

## 2016-05-01 ENCOUNTER — Telehealth: Payer: Self-pay | Admitting: *Deleted

## 2016-05-01 NOTE — Telephone Encounter (Signed)
Spoke to pt and relayed that her lab results were unremarkable.  She verbalized understanding.  

## 2016-05-01 NOTE — Telephone Encounter (Signed)
-----   Message from Ward Givens, NP sent at 05/01/2016  8:30 AM EDT ----- Lab work unremarkable. Please call patient.

## 2016-06-15 ENCOUNTER — Telehealth: Payer: Self-pay | Admitting: *Deleted

## 2016-06-15 NOTE — Telephone Encounter (Signed)
Patient called for refill on tegretol. Advised her that refill for one year was sent to [facility] in Oct. Advised she call [facility]. She verbalized understanding, appreciation.

## 2016-06-27 ENCOUNTER — Telehealth: Payer: Self-pay | Admitting: *Deleted

## 2016-06-27 ENCOUNTER — Other Ambulatory Visit: Payer: Self-pay | Admitting: Hematology and Oncology

## 2016-06-27 DIAGNOSIS — C189 Malignant neoplasm of colon, unspecified: Secondary | ICD-10-CM

## 2016-06-27 NOTE — Telephone Encounter (Signed)
Pt reports she has "old symptoms" of her colon cancer.  Her symptoms are 1. Extreme fatigue and 2. Darker stools.  She is concerned her "cancer is back" and would like to see Dr. Alvy Bimler.   Instructed pt I will let Dr. Alvy Bimler know and call her back tomorrow.  I also suggested she contact GI MD, Dr. Michail Sermon, for appt.. She will also likely need GI eval for the darker stools.  Gave her phone number to call Dr. Kathline Magic office for appt.. Pt verbalized understanding.

## 2016-06-27 NOTE — Telephone Encounter (Signed)
LVM for pt to return nurse's call so I can relay Dr. Calton Dach message below; "I have placed orders for labs and CT to be done on 1/8 Can you check if she is OK to have labs and CT done on 1/8 and see me at 830 am, on 1/9? If so, please place scheduling message Her melena could be due to her antiplatelet agents; She needs to call her cardiologist to see if she can hold off taking them until further eval"

## 2016-06-27 NOTE — Telephone Encounter (Signed)
I have placed orders for labs and CT to be done on 1/8 Can you check if she is OK to have labs and CT done on 1/8 and see me at 830 am, on 1/9? If so, please place scheduling message Her melena could be due to her antiplatelet agents; She needs to call her cardiologist to see if she can hold off taking them until further eval

## 2016-06-28 ENCOUNTER — Telehealth: Payer: Self-pay | Admitting: Hematology and Oncology

## 2016-06-28 ENCOUNTER — Ambulatory Visit (INDEPENDENT_AMBULATORY_CARE_PROVIDER_SITE_OTHER): Payer: BLUE CROSS/BLUE SHIELD | Admitting: Cardiovascular Disease

## 2016-06-28 ENCOUNTER — Telehealth: Payer: Self-pay | Admitting: Cardiovascular Disease

## 2016-06-28 ENCOUNTER — Encounter: Payer: Self-pay | Admitting: Cardiovascular Disease

## 2016-06-28 VITALS — HR 84 | Wt 195.0 lb

## 2016-06-28 DIAGNOSIS — I200 Unstable angina: Secondary | ICD-10-CM

## 2016-06-28 DIAGNOSIS — Z01818 Encounter for other preprocedural examination: Secondary | ICD-10-CM | POA: Diagnosis not present

## 2016-06-28 DIAGNOSIS — I119 Hypertensive heart disease without heart failure: Secondary | ICD-10-CM | POA: Diagnosis not present

## 2016-06-28 DIAGNOSIS — R079 Chest pain, unspecified: Secondary | ICD-10-CM

## 2016-06-28 DIAGNOSIS — E7800 Pure hypercholesterolemia, unspecified: Secondary | ICD-10-CM

## 2016-06-28 LAB — BASIC METABOLIC PANEL
BUN: 19 mg/dL (ref 7–25)
CALCIUM: 9.2 mg/dL (ref 8.6–10.4)
CO2: 26 mmol/L (ref 20–31)
CREATININE: 0.67 mg/dL (ref 0.50–0.99)
Chloride: 101 mmol/L (ref 98–110)
GLUCOSE: 117 mg/dL — AB (ref 65–99)
Potassium: 4.9 mmol/L (ref 3.5–5.3)
SODIUM: 136 mmol/L (ref 135–146)

## 2016-06-28 LAB — CBC
HCT: 42.1 % (ref 35.0–45.0)
Hemoglobin: 14.1 g/dL (ref 11.7–15.5)
MCH: 29.7 pg (ref 27.0–33.0)
MCHC: 33.5 g/dL (ref 32.0–36.0)
MCV: 88.6 fL (ref 80.0–100.0)
MPV: 9.7 fL (ref 7.5–12.5)
PLATELETS: 311 10*3/uL (ref 140–400)
RBC: 4.75 MIL/uL (ref 3.80–5.10)
RDW: 13.2 % (ref 11.0–15.0)
WBC: 8.3 10*3/uL (ref 3.8–10.8)

## 2016-06-28 LAB — PROTIME-INR
INR: 1
Prothrombin Time: 10.6 s (ref 9.0–11.5)

## 2016-06-28 LAB — TROPONIN I: TROPONIN I: 0.01 ng/mL (ref <=0.05)

## 2016-06-28 MED ORDER — ISOSORBIDE MONONITRATE ER 60 MG PO TB24: 60.0000 mg | ORAL_TABLET | Freq: Every day | ORAL | 6 refills | Status: DC

## 2016-06-28 NOTE — Telephone Encounter (Signed)
Left another VM for pt informing her of appts.  Also informed her of Dr. Calton Dach note instructing to check w/ her Cardiologist regarding antiplatelet (plavix) medication due to dark stools/ possible bleeding.  Asked pt to call back if any questions or she can't make appts as scheduled.

## 2016-06-28 NOTE — Progress Notes (Signed)
Cardiology Office Note   Date:  06/28/2016   ID:  Rachel Clark, DOB 09/25/1950, MRN [id number]  PCP:  Vidal Schwalbe, MD  Cardiologist:   Skeet Latch, MD   No chief complaint on file.    History of Present Illness: Rachel Clark is a 65 y.o. female with CAD s/p CABG and PCI, hypertension, hyperlipidemia, SSS s/p PPM, hypothyroidism, and MGUS who presents for an evaluation of chest pain.  Ms. Bahn was seen as an urgent visit today after reporting chest pain to her gastroenterologist, Dr. Michail Sermon.  She notes that for the last two days she has been feeling intermittent chest pressure.  It occurs at rest but is worse with exertion.  There is associated shortness of breath but no nausea or diaphoresis.  She underwent cath 11/2015 and was found to have severe RCA and LAD disease.  Her LIMA to the LAD was patent and the SVG to RCA was chronically occluded.  She was treated with stenting throughout the RCA.  After PCI she was doing well and had no angina until two days ago. She denies lower extremity edema, orthopnea or PND.   Past Medical History:  Diagnosis Date  . Colon cancer ([place]) 12/04/2011   s/p Laparoscopic-assisted transverse colectomy on 12/19/2011 by Dr. Donne Hazel.  pT3 N0 M0.   . Coronary artery disease    a. remote NSTEMI with PCI to the RCA; b. S/P CABG in 2007; c. 2010 - 2 v CAD with L-LAD and S-RCA patent, small caliber Dx 70% (treated medically - no amenable to PCI), CFX free of significant disease, normal LVF. d. 10/2014 Lexi MV: EF 61%, no ischemia/infarct.  . Depression   . Dyslipidemia   . Fibromyalgia   . Gastric ulcer   . GERD (gastroesophageal reflux disease)   . 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 Hyperlipidemia   . Hypothyroid   . Iron deficiency anemia 11/17/2011  . Monoclonal gammopathy of unknown significance   . Myocardial infarction   . Syncope and collapse    pacemaker  implanted  . Unstable angina [facility])     Past Surgical History:  Procedure Laterality Date  . ANTERIOR CERVICAL DECOMP/DISCECTOMY FUSION  1980's  . BACK SURGERY    . CARDIAC CATHETERIZATION  05/20/2009   obstructive native vessel disease in LAD, RCA, and first diagonal, patent vein graft to distal RCA and LIMA to LAD,normal. ef 60%  . CARDIAC CATHETERIZATION N/A 03/24/2015   Procedure: Left Heart Cath and Cors/Grafts Angiography;  Surgeon: Wellington Hampshire, MD;  Location: [place] [facility];  Service: Cardiovascular;  Laterality: N/A;  . CARDIAC CATHETERIZATION N/A 07/28/2015   Procedure: Left Heart Cath and Coronary Angiography;  Surgeon: Troy Sine, MD;  Location: [place] [facility];  Service: Cardiovascular;  Laterality: N/A;  . CARDIAC CATHETERIZATION N/A 11/09/2015   Procedure: Coronary Stent Intervention;  Surgeon: Sherren Mocha, MD;  Location: [place] [facility];  Service: Cardiovascular;  Laterality: N/A;  . CARDIAC CATHETERIZATION N/A 11/09/2015   Procedure: Left Heart Cath and Coronary Angiography;  Surgeon: Sherren Mocha, MD;  Location: [place] [facility];  Service: Cardiovascular;  Laterality: N/A;  . COLON RESECTION  12/19/2011   Procedure: COLON RESECTION LAPAROSCOPIC;  Surgeon: Rolm Bookbinder, MD;  Location: [place];  Service: General;  Laterality: N/A;  laparoscopic hand assisted partial colon resection  . COLON SURGERY    . COLONOSCOPY    .  CORONARY ANGIOPLASTY WITH STENT PLACEMENT  ~ 2007   1 stent  . CORONARY ARTERY BYPASS GRAFT  2007   x 2  . DILATION AND CURETTAGE OF UTERUS  1973  . EP IMPLANTABLE DEVICE N/A 03/25/2015   MDT Advisa DR pacemaker implanted by Dr Rayann Heman for transient complete heart block and syncope  . PORT-A-CATH REMOVAL N/A 06/12/2013   Procedure: REMOVAL PORT-A-CATH;  Surgeon: Rolm Bookbinder, MD;  Location: [place];  Service: General;  Laterality: N/A;  . PORTACATH PLACEMENT  06/04/2012   Procedure: INSERTION PORT-A-CATH;   Surgeon: Rolm Bookbinder, MD;  Location: [place];  Service: General;  Laterality: N/A;  Insertion of port-a-cath   . POSTERIOR LAMINECTOMY / DECOMPRESSION CERVICAL SPINE     20+yrs ago  . VAGINAL HYSTERECTOMY       Current Outpatient Prescriptions  Medication Sig Dispense Refill  . acetaminophen (TYLENOL) 500 MG tablet Take 500-1,000 mg by mouth daily as needed for mild pain or headache.    Marland Kitchen amLODipine (NORVASC) 10 MG tablet Take 5 mg by mouth daily.    Marland Kitchen aspirin EC 81 MG EC tablet Take 1 tablet (81 mg total) by mouth daily.    . Azelastine HCl 0.15 % SOLN Place 1 spray into both nostrils daily as needed (seasonal allergies).   12  . carbamazepine (TEGRETOL XR) 100 MG 12 hr tablet TAKE 5 TABLETS TWICE A DAY 300 tablet 11  . carvedilol (COREG) 25 MG tablet Take 1 tablet (25 mg total) by mouth 2 (two) times daily with a meal. 60 tablet 11  . cephALEXin (KEFLEX) 250 MG capsule Take 250 mg four times a day for 7 days 28 capsule 0  . clopidogrel (PLAVIX) 75 MG tablet TAKE 1 TABLET DAILY KEEP OFFICE VISIT (Patient not taking: Reported on 04/27/2016) 90 tablet 1  . DULoxetine (CYMBALTA) 60 MG capsule Take 60 mg by mouth daily.     Marland Kitchen estradiol (VIVELLE-DOT) 0.05 MG/24HR patch Place 1 patch onto the skin 2 (two) times a week. Sunday, Wednesday    . ethosuximide (ZARONTIN) 250 MG capsule Take 2 capsule (500mg ) in the morning, , and 2 capsules (500mg ) at night 120 capsule 11  . ezetimibe (ZETIA) 10 MG tablet Take 1 tablet by mouth once daily.  90 tablet 3  . isosorbide mononitrate (IMDUR) 60 MG 24 hr tablet Take 1 tablet (60 mg total) by mouth daily. 30 tablet 6  . levothyroxine (SYNTHROID, LEVOTHROID) 50 MCG tablet Take 50 mcg by mouth daily.     . nitroGLYCERIN (NITROSTAT) 0.4 MG SL tablet Place 1 tablet (0.4 mg total) under the tongue every 5 (five) minutes as needed. For chest pain. (Patient taking differently: Place 0.4 mg under the tongue every 5 (five) minutes as needed for chest pain. ) 25  tablet 6  . tiZANidine (ZANAFLEX) 2 MG tablet Take 4 tablets by mouth at bedtime.   2   No current facility-administered medications for this visit.     Allergies:   Aspirin; Erythromycin; Lisinopril; Niacin and related; Statins; Sulfa drugs cross reactors; Tetracyclines & related; and Penicillins    Social History:  The patient  reports that she quit smoking about 35 years ago. Her smoking use included Cigarettes. She has a 6.00 pack-year smoking history. She has never used smokeless tobacco. She reports that she drinks about 0.6 oz of alcohol per week . She reports that she does not use drugs.   Family History:  The patient's family history includes Cancer in her  mother; Heart attack in her father; Heart attack (age of onset: 25) in her brother; Heart disease in her father.    ROS:  Please see the history of present illness.   Otherwise, review of systems are positive for none.   All other systems are reviewed and negative.    PHYSICAL EXAM: VS:  Pulse 84  , BMI There is no height or weight on file to calculate BMI. GENERAL:  Well appearing HEENT:  Pupils equal round and reactive, fundi not visualized, oral mucosa unremarkable NECK:  No jugular venous distention, waveform within normal limits, carotid upstroke brisk and symmetric, no bruits, no thyromegaly LYMPHATICS:  No cervical adenopathy LUNGS:  Clear to auscultation bilaterally HEART:  RRR.  PMI not displaced or sustained,S1 and S2 within normal limits, no S3, no S4, no clicks, no rubs, no murmurs ABD:  Flat, positive bowel sounds normal in frequency in pitch, no bruits, no rebound, no guarding, no midline pulsatile mass, no hepatomegaly, no splenomegaly EXT:  2 plus pulses throughout, no edema, no cyanosis no clubbing SKIN:  No rashes no nodules NEURO:  Cranial nerves II through XII grossly intact, motor grossly intact throughout PSYCH:  Cognitively intact, oriented to person place and time   EKG:  EKG is ordered today. The  ekg ordered today demonstrates sinus rhythm.  Rate 84 bpm.    Recent Labs: 11/10/2015: Hemoglobin 12.6 04/27/2016: ALT 14; BUN 15; Creatinine, Ser 0.66; Platelets 273; Potassium 5.2; Sodium 139    Lipid Panel    Component Value Date/Time   CHOL 232 (H) 11/06/2015 0223   TRIG 102 11/06/2015 0223   HDL 80 11/06/2015 0223   CHOLHDL 2.9 11/06/2015 0223   VLDL 20 11/06/2015 0223   LDLCALC 132 (H) 11/06/2015 0223      Wt Readings from Last 3 Encounters:  04/27/16 88.5 kg (195 lb)  03/23/16 87.6 kg (193 lb 3.2 oz)  11/26/15 88 kg (194 lb)      ASSESSMENT AND PLAN:  # Unstabnle agina: Ms. Duggar symptoms are concerning for ischemia.  She has known, severe CAD and a full metal jacket RCA.  We will increase her Imdur to 30mg  daily.  Continue aspirin, clopidogrel, and amlodipine.  We will check a troponin and plan for [facility] tomorrow due to her high pre-test probability of ischemia.  # Hypertension: Continue carvedilol and amlodipine.  # Hyperlipidemia: Continue Zetia.  She hasn't tolerated statins and isn't interested in PCSK9 inhibitors.  Hopefully she will reconsider.  Current medicines are reviewed at length with the patient today.  The patient does not have concerns regarding medicines.  The following changes have been made:  Increase Imdur to 60mg  daily  Labs/ tests ordered today:  Orders Placed This Encounter  Procedures  . CBC  . Protime-INR  . Basic metabolic panel  . Troponin I  . EKG 12-Lead  . LEFT HEART CATHETERIZATION WITH CORONARY ANGIOGRAM     Disposition:   FU with Dr. [name] after cath.     This note was written with the assistance of speech recognition software.  Please excuse any transcriptional errors.  Signed, Qamar Aughenbaugh C. Oval Linsey, MD, [facility]  06/28/2016 5:20 PM    [facility]

## 2016-06-28 NOTE — Telephone Encounter (Signed)
New message       Pt in office with chest pain.  Talk to DOD

## 2016-06-28 NOTE — Patient Instructions (Addendum)
LABS TODAY  BMP, PT/INR, CBC, TROP I    INCREASE ISOSORBIDE TO 60 MG ONCE A DAY.   SCHEDULE FOR LEFT HEART CATH TOMORROW WITH DR END. Your physician has requested that you have a cardiac catheterization. Cardiac catheterization is used to diagnose and/or treat various heart conditions. Doctors may recommend this procedure for a number of different reasons. The most common reason is to evaluate chest pain. Chest pain can be a symptom of coronary artery disease (CAD), and cardiac catheterization can show whether plaque is narrowing or blocking your heart's arteries. This procedure is also used to evaluate the valves, as well as measure the blood flow and oxygen levels in different parts of your heart. For further information please visit [email or web address]. Please follow instruction sheet, as given.   Your physician recommends that you schedule a follow-up appointment  With Dr [name]

## 2016-06-28 NOTE — Telephone Encounter (Signed)
Dr Oval Linsey spoke to Dr Michail Sermon ( GI).appointment is schedule.  patient will come to office for EKG-  Be evaluated if needed.

## 2016-06-28 NOTE — Telephone Encounter (Signed)
lvm to inform pt of 1/8 and 1/9 appt date/times per LOS

## 2016-06-29 ENCOUNTER — Ambulatory Visit (HOSPITAL_COMMUNITY)
Admission: RE | Admit: 2016-06-29 | Discharge: 2016-06-30 | Disposition: A | Discharge status: Home / Self Care | Payer: BLUE CROSS/BLUE SHIELD | Source: Ambulatory Visit | Attending: Internal Medicine | Admitting: Internal Medicine

## 2016-06-29 ENCOUNTER — Encounter (HOSPITAL_COMMUNITY)
Admission: RE | Disposition: A | Discharge status: Home / Self Care | Payer: Self-pay | Source: Ambulatory Visit | Attending: Internal Medicine

## 2016-06-29 ENCOUNTER — Encounter (HOSPITAL_COMMUNITY): Payer: Self-pay | Admitting: General Practice

## 2016-06-29 DIAGNOSIS — Z9889 Other specified postprocedural states: Secondary | ICD-10-CM | POA: Insufficient documentation

## 2016-06-29 DIAGNOSIS — Z9049 Acquired absence of other specified parts of digestive tract: Secondary | ICD-10-CM | POA: Diagnosis not present

## 2016-06-29 DIAGNOSIS — Z955 Presence of coronary angioplasty implant and graft: Secondary | ICD-10-CM | POA: Insufficient documentation

## 2016-06-29 DIAGNOSIS — Z8719 Personal history of other diseases of the digestive system: Secondary | ICD-10-CM | POA: Insufficient documentation

## 2016-06-29 DIAGNOSIS — F329 Major depressive disorder, single episode, unspecified: Secondary | ICD-10-CM | POA: Insufficient documentation

## 2016-06-29 DIAGNOSIS — E785 Hyperlipidemia, unspecified: Secondary | ICD-10-CM | POA: Diagnosis not present

## 2016-06-29 DIAGNOSIS — Z981 Arthrodesis status: Secondary | ICD-10-CM | POA: Diagnosis not present

## 2016-06-29 DIAGNOSIS — Z950 Presence of cardiac pacemaker: Secondary | ICD-10-CM | POA: Diagnosis not present

## 2016-06-29 DIAGNOSIS — Z9861 Coronary angioplasty status: Secondary | ICD-10-CM

## 2016-06-29 DIAGNOSIS — I252 Old myocardial infarction: Secondary | ICD-10-CM | POA: Insufficient documentation

## 2016-06-29 DIAGNOSIS — G40409 Other generalized epilepsy and epileptic syndromes, not intractable, without status epilepticus: Secondary | ICD-10-CM | POA: Insufficient documentation

## 2016-06-29 DIAGNOSIS — Z85038 Personal history of other malignant neoplasm of large intestine: Secondary | ICD-10-CM | POA: Insufficient documentation

## 2016-06-29 DIAGNOSIS — I10 Essential (primary) hypertension: Secondary | ICD-10-CM | POA: Diagnosis not present

## 2016-06-29 DIAGNOSIS — K219 Gastro-esophageal reflux disease without esophagitis: Secondary | ICD-10-CM | POA: Insufficient documentation

## 2016-06-29 DIAGNOSIS — M797 Fibromyalgia: Secondary | ICD-10-CM | POA: Insufficient documentation

## 2016-06-29 DIAGNOSIS — Z87891 Personal history of nicotine dependence: Secondary | ICD-10-CM | POA: Insufficient documentation

## 2016-06-29 DIAGNOSIS — Z7902 Long term (current) use of antithrombotics/antiplatelets: Secondary | ICD-10-CM | POA: Insufficient documentation

## 2016-06-29 DIAGNOSIS — X58XXXA Exposure to other specified factors, initial encounter: Secondary | ICD-10-CM | POA: Insufficient documentation

## 2016-06-29 DIAGNOSIS — D472 Monoclonal gammopathy: Secondary | ICD-10-CM | POA: Insufficient documentation

## 2016-06-29 DIAGNOSIS — I200 Unstable angina: Secondary | ICD-10-CM | POA: Diagnosis present

## 2016-06-29 DIAGNOSIS — Z951 Presence of aortocoronary bypass graft: Secondary | ICD-10-CM | POA: Insufficient documentation

## 2016-06-29 DIAGNOSIS — Z79899 Other long term (current) drug therapy: Secondary | ICD-10-CM | POA: Insufficient documentation

## 2016-06-29 DIAGNOSIS — Z882 Allergy status to sulfonamides status: Secondary | ICD-10-CM | POA: Diagnosis not present

## 2016-06-29 DIAGNOSIS — E039 Hypothyroidism, unspecified: Secondary | ICD-10-CM | POA: Insufficient documentation

## 2016-06-29 DIAGNOSIS — I25110 Atherosclerotic heart disease of native coronary artery with unstable angina pectoris: Secondary | ICD-10-CM

## 2016-06-29 DIAGNOSIS — T82858A Stenosis of vascular prosthetic devices, implants and grafts, initial encounter: Secondary | ICD-10-CM | POA: Insufficient documentation

## 2016-06-29 DIAGNOSIS — Z880 Allergy status to penicillin: Secondary | ICD-10-CM | POA: Diagnosis not present

## 2016-06-29 DIAGNOSIS — Z90710 Acquired absence of both cervix and uterus: Secondary | ICD-10-CM | POA: Insufficient documentation

## 2016-06-29 DIAGNOSIS — Z7982 Long term (current) use of aspirin: Secondary | ICD-10-CM | POA: Insufficient documentation

## 2016-06-29 DIAGNOSIS — I2510 Atherosclerotic heart disease of native coronary artery without angina pectoris: Secondary | ICD-10-CM | POA: Diagnosis present

## 2016-06-29 DIAGNOSIS — Z8249 Family history of ischemic heart disease and other diseases of the circulatory system: Secondary | ICD-10-CM | POA: Insufficient documentation

## 2016-06-29 HISTORY — DX: Other chronic pain: G89.29

## 2016-06-29 HISTORY — DX: Low back pain, unspecified: M54.50

## 2016-06-29 HISTORY — DX: Headache: R51

## 2016-06-29 HISTORY — DX: Headache, unspecified: R51.9

## 2016-06-29 HISTORY — PX: CARDIAC CATHETERIZATION: SHX172

## 2016-06-29 HISTORY — DX: Low back pain: M54.5

## 2016-06-29 LAB — POCT ACTIVATED CLOTTING TIME: Activated Clotting Time: 169 seconds

## 2016-06-29 SURGERY — LEFT HEART CATH AND CORONARY ANGIOGRAPHY
Anesthesia: LOCAL

## 2016-06-29 MED ORDER — SODIUM CHLORIDE 0.9 % WEIGHT BASED INFUSION: 1.0000 mL/kg/h | INTRAVENOUS | Status: DC

## 2016-06-29 MED ORDER — ASPIRIN 81 MG PO CHEW
CHEWABLE_TABLET | ORAL | Status: AC
  Filled 2016-06-29: qty 1

## 2016-06-29 MED ORDER — LEVOTHYROXINE SODIUM 50 MCG PO TABS
50.0000 ug | ORAL_TABLET | Freq: Every day | ORAL | Status: DC
  Administered 2016-06-30: 06:00:00 50 ug via ORAL
  Filled 2016-06-29: qty 1

## 2016-06-29 MED ORDER — ASPIRIN 81 MG PO CHEW
81.0000 mg | CHEWABLE_TABLET | Freq: Every day | ORAL | Status: DC
  Administered 2016-06-30: 11:00:00 81 mg via ORAL
  Filled 2016-06-29: qty 1

## 2016-06-29 MED ORDER — LIDOCAINE HCL (PF) 1 % IJ SOLN
INTRAMUSCULAR | Status: AC
  Filled 2016-06-29: qty 30

## 2016-06-29 MED ORDER — MIDAZOLAM HCL 2 MG/2ML IJ SOLN
INTRAMUSCULAR | Status: DC | PRN
  Administered 2016-06-29: 1 mg via INTRAVENOUS

## 2016-06-29 MED ORDER — SODIUM CHLORIDE 0.9 % IV SOLN: 250.0000 mL | INTRAVENOUS | Status: DC | PRN

## 2016-06-29 MED ORDER — IOPAMIDOL (ISOVUE-370) INJECTION 76%
INTRAVENOUS | Status: DC | PRN
Start: 2016-06-29 — End: 2016-06-29
  Administered 2016-06-29: 135 mL via INTRA_ARTERIAL

## 2016-06-29 MED ORDER — HEPARIN SODIUM (PORCINE) 1000 UNIT/ML IJ SOLN
INTRAMUSCULAR | Status: DC | PRN
  Administered 2016-06-29: 6000 [IU] via INTRAVENOUS
  Administered 2016-06-29 (×2): 2000 [IU] via INTRAVENOUS

## 2016-06-29 MED ORDER — DULOXETINE HCL 60 MG PO CPEP
60.0000 mg | ORAL_CAPSULE | Freq: Every day | ORAL | Status: DC
  Filled 2016-06-29: qty 1

## 2016-06-29 MED ORDER — HYDRALAZINE HCL 20 MG/ML IJ SOLN: 5.0000 mg | INTRAMUSCULAR | Status: AC | PRN

## 2016-06-29 MED ORDER — SODIUM CHLORIDE 0.9 % IV SOLN
250.0000 mL | INTRAVENOUS | Status: DC | PRN
Start: 2016-06-29 — End: 2016-06-30

## 2016-06-29 MED ORDER — ASPIRIN 81 MG PO CHEW
81.0000 mg | CHEWABLE_TABLET | ORAL | Status: AC
  Administered 2016-06-29: 81 mg via ORAL

## 2016-06-29 MED ORDER — ADENOSINE 12 MG/4ML IV SOLN
INTRAVENOUS | Status: AC
  Filled 2016-06-29: qty 4

## 2016-06-29 MED ORDER — CARBAMAZEPINE ER 200 MG PO TB12
500.0000 mg | ORAL_TABLET | Freq: Two times a day (BID) | ORAL | Status: DC
  Administered 2016-06-29: 500 mg via ORAL
  Filled 2016-06-29 (×2): qty 1

## 2016-06-29 MED ORDER — LIDOCAINE HCL (PF) 1 % IJ SOLN
INTRAMUSCULAR | Status: DC | PRN
  Administered 2016-06-29: 15 mL

## 2016-06-29 MED ORDER — NITROGLYCERIN 0.4 MG SL SUBL: 0.4000 mg | SUBLINGUAL_TABLET | SUBLINGUAL | Status: DC | PRN

## 2016-06-29 MED ORDER — CLOPIDOGREL BISULFATE 300 MG PO TABS
ORAL_TABLET | ORAL | Status: AC
  Filled 2016-06-29: qty 1

## 2016-06-29 MED ORDER — FENTANYL CITRATE (PF) 100 MCG/2ML IJ SOLN
INTRAMUSCULAR | Status: DC | PRN
Start: 2016-06-29 — End: 2016-06-29
  Administered 2016-06-29: 25 ug via INTRAVENOUS

## 2016-06-29 MED ORDER — HEPARIN (PORCINE) IN NACL 2-0.9 UNIT/ML-% IJ SOLN
INTRAMUSCULAR | Status: AC
  Filled 2016-06-29: qty 1000

## 2016-06-29 MED ORDER — ISOSORBIDE MONONITRATE ER 30 MG PO TB24: 30.0000 mg | ORAL_TABLET | Freq: Every day | ORAL | Status: DC

## 2016-06-29 MED ORDER — SODIUM CHLORIDE 0.9% FLUSH: 3.0000 mL | INTRAVENOUS | Status: DC | PRN

## 2016-06-29 MED ORDER — CLOPIDOGREL BISULFATE 75 MG PO TABS
75.0000 mg | ORAL_TABLET | Freq: Every day | ORAL | Status: DC
  Administered 2016-06-30: 11:00:00 75 mg via ORAL
  Filled 2016-06-29: qty 1

## 2016-06-29 MED ORDER — IOPAMIDOL (ISOVUE-370) INJECTION 76%
INTRAVENOUS | Status: AC
  Filled 2016-06-29: qty 125

## 2016-06-29 MED ORDER — ADENOSINE (DIAGNOSTIC) 140MCG/KG/MIN
INTRAVENOUS | Status: DC | PRN
  Administered 2016-06-29: 140 ug/kg/min via INTRAVENOUS

## 2016-06-29 MED ORDER — ETHOSUXIMIDE 250 MG PO CAPS
500.0000 mg | ORAL_CAPSULE | Freq: Two times a day (BID) | ORAL | Status: DC
  Administered 2016-06-29: 22:00:00 500 mg via ORAL
  Filled 2016-06-29 (×2): qty 2

## 2016-06-29 MED ORDER — TIZANIDINE HCL 2 MG PO TABS
2.0000 mg | ORAL_TABLET | Freq: Every evening | ORAL | Status: DC | PRN
  Filled 2016-06-29: qty 2

## 2016-06-29 MED ORDER — MIDAZOLAM HCL 2 MG/2ML IJ SOLN
INTRAMUSCULAR | Status: AC
  Filled 2016-06-29: qty 2

## 2016-06-29 MED ORDER — FENTANYL CITRATE (PF) 100 MCG/2ML IJ SOLN
INTRAMUSCULAR | Status: AC
  Filled 2016-06-29: qty 2

## 2016-06-29 MED ORDER — EZETIMIBE 10 MG PO TABS: 10.0000 mg | ORAL_TABLET | Freq: Every day | ORAL | Status: DC

## 2016-06-29 MED ORDER — HEPARIN SODIUM (PORCINE) 1000 UNIT/ML IJ SOLN
INTRAMUSCULAR | Status: AC
  Filled 2016-06-29: qty 1

## 2016-06-29 MED ORDER — HEPARIN (PORCINE) IN NACL 2-0.9 UNIT/ML-% IJ SOLN
INTRAMUSCULAR | Status: DC | PRN
Start: 2016-06-29 — End: 2016-06-29
  Administered 2016-06-29: 500 mL
  Administered 2016-06-29: 1000 mL

## 2016-06-29 MED ORDER — LABETALOL HCL 5 MG/ML IV SOLN: 10.0000 mg | INTRAVENOUS | Status: AC | PRN

## 2016-06-29 MED ORDER — LEVOTHYROXINE SODIUM 50 MCG PO TABS: 50.0000 ug | ORAL_TABLET | Freq: Every day | ORAL | Status: DC

## 2016-06-29 MED ORDER — SODIUM CHLORIDE 0.9 % IV SOLN
INTRAVENOUS | Status: DC
  Administered 2016-06-29: 11:00:00 via INTRAVENOUS

## 2016-06-29 MED ORDER — SODIUM CHLORIDE 0.9% FLUSH: 3.0000 mL | Freq: Two times a day (BID) | INTRAVENOUS | Status: DC

## 2016-06-29 MED ORDER — ACETAMINOPHEN 325 MG PO TABS: 650.0000 mg | ORAL_TABLET | ORAL | Status: DC | PRN

## 2016-06-29 MED ORDER — AMLODIPINE BESYLATE 10 MG PO TABS: 10.0000 mg | ORAL_TABLET | Freq: Every day | ORAL | Status: DC

## 2016-06-29 MED ORDER — ONDANSETRON HCL 4 MG/2ML IJ SOLN: 4.0000 mg | Freq: Four times a day (QID) | INTRAMUSCULAR | Status: DC | PRN

## 2016-06-29 MED ORDER — SODIUM CHLORIDE 0.9 % WEIGHT BASED INFUSION
3.0000 mL/kg/h | INTRAVENOUS | Status: DC
  Administered 2016-06-29: 3 mL/kg/h via INTRAVENOUS

## 2016-06-29 MED ORDER — ANGIOPLASTY BOOK
Freq: Once | Status: AC
  Administered 2016-06-29: 21:00:00
  Filled 2016-06-29: qty 1

## 2016-06-29 MED ORDER — CARVEDILOL 3.125 MG PO TABS
6.2500 mg | ORAL_TABLET | Freq: Two times a day (BID) | ORAL | Status: DC
  Administered 2016-06-29: 6.25 mg via ORAL
  Filled 2016-06-29: qty 2

## 2016-06-29 MED ORDER — IOPAMIDOL (ISOVUE-370) INJECTION 76%
INTRAVENOUS | Status: AC
Start: 2016-06-29 — End: 2016-06-29
  Filled 2016-06-29: qty 50

## 2016-06-29 MED ORDER — CLOPIDOGREL BISULFATE 300 MG PO TABS
ORAL_TABLET | ORAL | Status: DC | PRN
  Administered 2016-06-29: 300 mg via ORAL

## 2016-06-29 SURGICAL SUPPLY — 19 items
BALLN ~~LOC~~ MOZEC 2.75X15 (BALLOONS) ×2
BALLOON ~~LOC~~ MOZEC 2.75X15 (BALLOONS) IMPLANT
CATH EXPO 5F IM (CATHETERS) ×1 IMPLANT
CATH INFINITI 5FR MULTPACK ANG (CATHETERS) ×1 IMPLANT
CATH MICROCATH NAVVUS (MICROCATHETER) IMPLANT
CATH VISTA GUIDE 6FR JR4 (CATHETERS) ×1 IMPLANT
KIT ENCORE 26 ADVANTAGE (KITS) ×1 IMPLANT
KIT HEART LEFT (KITS) ×2 IMPLANT
MICROCATHETER NAVVUS (MICROCATHETER) ×2
PACK CARDIAC CATHETERIZATION (CUSTOM PROCEDURE TRAY) ×2 IMPLANT
SET INTRODUCER MICROPUNCT 5F (INTRODUCER) ×1 IMPLANT
SHEATH PINNACLE 5F 10CM (SHEATH) ×1 IMPLANT
SHEATH PINNACLE 6F 10CM (SHEATH) ×1 IMPLANT
SYR MEDRAD MARK V 150ML (SYRINGE) ×2 IMPLANT
TRANSDUCER W/STOPCOCK (MISCELLANEOUS) ×2 IMPLANT
TUBING CIL FLEX 10 FLL-RA (TUBING) ×2 IMPLANT
VALVE GUARDIAN II ~~LOC~~ HEMO (MISCELLANEOUS) ×1 IMPLANT
WIRE ASAHI PROWATER 180CM (WIRE) ×1 IMPLANT
WIRE EMERALD 3MM-J .035X150CM (WIRE) ×1 IMPLANT

## 2016-06-29 NOTE — Care Management Note (Signed)
Case Management Note  Patient Details  Name: LAKENZIE STONEBURNER MRN: [id number] Date of Birth: 02/20/1951  Subjective/Objective:    S/p Coronary Balloon Angioplasty , will be on plavix and asa, NCM will cont to follow for dc needs.                Action/Plan:   Expected Discharge Date:                  Expected Discharge Plan:  Home/Self Care  In-House Referral:     Discharge planning Services  CM Consult  Post Acute Care Choice:    Choice offered to:     DME Arranged:    DME Agency:     HH Arranged:    HH Agency:     Status of Service:  In process, will continue to follow  If discussed at Long Length of Stay Meetings, dates discussed:    Additional Comments:  Zenon Mayo, RN 06/29/2016, 3:00 PM

## 2016-06-29 NOTE — Interval H&P Note (Signed)
History and Physical Interval Note:  06/29/2016 8:58 AM  Rachel Clark  has presented today for cardiac catheterization, with the diagnosis of chest pain. The various methods of treatment have been discussed with the patient and family. After consideration of risks, benefits and other options for treatment, the patient has consented to  Procedure(s): Left Heart Cath and Coronary Angiography (N/A) as a surgical intervention .  The patient's history has been reviewed, patient examined, no change in status, stable for surgery.  I have reviewed the patient's chart and labs.  Questions were answered to the patient's satisfaction.    Cath Lab Visit (complete for each Cath Lab visit)  Clinical Evaluation Leading to the Procedure:   ACS: Yes.   (unstable angina)  Non-ACS:    Anginal Classification: CCS IV  Anti-ischemic medical therapy: Maximal Therapy (2 or more classes of medications)  Non-Invasive Test Results: No non-invasive testing performed  Prior CABG: Previous CABG  Rachel Clark

## 2016-06-29 NOTE — Brief Op Note (Signed)
BRIEF CARDIAC CATHETERIZATION NOTE (FULL REPORT TO FOLLOW)  Date:  06/29/2016 Time: 10:41 AM  PATIENT:  Rachel Clark  65 y.o. female  PRE-OPERATIVE DIAGNOSIS:  Unstable angina  POST-OPERATIVE DIAGNOSIS:  Same  PROCEDURE:  Procedure(s): Left Heart Cath and Coronary Angiography (N/A) Intravascular Pressure Wire/FFR Study (N/A) Coronary Balloon Angioplasty (N/A)  SURGEON:  Surgeon(s) and Role:    * Nelva Bush, MD - Primary  FINDINGS: 1.  Significant native coronary artery disease, including diffuse LAD disease with midvessel occlusion, small distal LAD with 70-80% disease, mild disease involving ramus and small LCx, and 60% proximal RCA in-stent restenosis (FFR 0.75). 2.  Mildly elevated left ventricular filling pressure. 3.  Basal inferior akinesis with otherwise preserved LV contraction. 4.  Successful balloon angioplasty to proximal RCA (already 2 layers of stent) with <10% residual stenosis and TIMI-3 flow.  RECOMMENDATIONS: 1.  Dual antiplatelet therapy for at least 1 month, ideally indefinitely. 2.  Monitor overnight and plan for d/c tomorrow.  Nelva Bush, MD [facility] Pager: [phone]

## 2016-06-29 NOTE — H&P (View-Only) (Signed)
Cardiology Office Note   Date:  06/28/2016   ID:  Rachel Clark, DOB October 10, 1950, MRN [id number]  PCP:  Vidal Schwalbe, MD  Cardiologist:   Skeet Latch, MD   No chief complaint on file.    History of Present Illness: Rachel Clark is a 65 y.o. female with CAD s/p CABG and PCI, hypertension, hyperlipidemia, SSS s/p PPM, hypothyroidism, and MGUS who presents for an evaluation of chest pain.  Rachel Clark was seen as an urgent visit today after reporting chest pain to her gastroenterologist, Dr. Michail Sermon.  She notes that for the last two days she has been feeling intermittent chest pressure.  It occurs at rest but is worse with exertion.  There is associated shortness of breath but no nausea or diaphoresis.  She underwent cath 11/2015 and was found to have severe RCA and LAD disease.  Her LIMA to the LAD was patent and the SVG to RCA was chronically occluded.  She was treated with stenting throughout the RCA.  After PCI she was doing well and had no angina until two days ago. She denies lower extremity edema, orthopnea or PND.   Past Medical History:  Diagnosis Date  . Colon cancer ([place]) 12/04/2011   s/p Laparoscopic-assisted transverse colectomy on 12/19/2011 by Dr. Donne Hazel.  pT3 N0 M0.   . Coronary artery disease    a. remote NSTEMI with PCI to the RCA; b. S/P CABG in 2007; c. 2010 - 2 v CAD with L-LAD and S-RCA patent, small caliber Dx 70% (treated medically - no amenable to PCI), CFX free of significant disease, normal LVF. d. 10/2014 Lexi MV: EF 61%, no ischemia/infarct.  . Depression   . Dyslipidemia   . Fibromyalgia   . Gastric ulcer   . GERD (gastroesophageal reflux disease)   . 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 Hyperlipidemia   . Hypothyroid   . Iron deficiency anemia 11/17/2011  . Monoclonal gammopathy of unknown significance   . Myocardial infarction   . Syncope and collapse    pacemaker  implanted  . Unstable angina [facility])     Past Surgical History:  Procedure Laterality Date  . ANTERIOR CERVICAL DECOMP/DISCECTOMY FUSION  1980's  . BACK SURGERY    . CARDIAC CATHETERIZATION  05/20/2009   obstructive native vessel disease in LAD, RCA, and first diagonal, patent vein graft to distal RCA and LIMA to LAD,normal. ef 60%  . CARDIAC CATHETERIZATION N/A 03/24/2015   Procedure: Left Heart Cath and Cors/Grafts Angiography;  Surgeon: Wellington Hampshire, MD;  Location: [place] [facility];  Service: Cardiovascular;  Laterality: N/A;  . CARDIAC CATHETERIZATION N/A 07/28/2015   Procedure: Left Heart Cath and Coronary Angiography;  Surgeon: Troy Sine, MD;  Location: [place] [facility];  Service: Cardiovascular;  Laterality: N/A;  . CARDIAC CATHETERIZATION N/A 11/09/2015   Procedure: Coronary Stent Intervention;  Surgeon: Sherren Mocha, MD;  Location: [place] [facility];  Service: Cardiovascular;  Laterality: N/A;  . CARDIAC CATHETERIZATION N/A 11/09/2015   Procedure: Left Heart Cath and Coronary Angiography;  Surgeon: Sherren Mocha, MD;  Location: [place] [facility];  Service: Cardiovascular;  Laterality: N/A;  . COLON RESECTION  12/19/2011   Procedure: COLON RESECTION LAPAROSCOPIC;  Surgeon: Rolm Bookbinder, MD;  Location: [place];  Service: General;  Laterality: N/A;  laparoscopic hand assisted partial colon resection  . COLON SURGERY    . COLONOSCOPY    .  CORONARY ANGIOPLASTY WITH STENT PLACEMENT  ~ 2007   1 stent  . CORONARY ARTERY BYPASS GRAFT  2007   x 2  . DILATION AND CURETTAGE OF UTERUS  1973  . EP IMPLANTABLE DEVICE N/A 03/25/2015   MDT Advisa DR pacemaker implanted by Dr Rayann Heman for transient complete heart block and syncope  . PORT-A-CATH REMOVAL N/A 06/12/2013   Procedure: REMOVAL PORT-A-CATH;  Surgeon: Rolm Bookbinder, MD;  Location: [place];  Service: General;  Laterality: N/A;  . PORTACATH PLACEMENT  06/04/2012   Procedure: INSERTION PORT-A-CATH;   Surgeon: Rolm Bookbinder, MD;  Location: [place];  Service: General;  Laterality: N/A;  Insertion of port-a-cath   . POSTERIOR LAMINECTOMY / DECOMPRESSION CERVICAL SPINE     20+yrs ago  . VAGINAL HYSTERECTOMY       Current Outpatient Prescriptions  Medication Sig Dispense Refill  . acetaminophen (TYLENOL) 500 MG tablet Take 500-1,000 mg by mouth daily as needed for mild pain or headache.    Marland Kitchen amLODipine (NORVASC) 10 MG tablet Take 5 mg by mouth daily.    Marland Kitchen aspirin EC 81 MG EC tablet Take 1 tablet (81 mg total) by mouth daily.    . Azelastine HCl 0.15 % SOLN Place 1 spray into both nostrils daily as needed (seasonal allergies).   12  . carbamazepine (TEGRETOL XR) 100 MG 12 hr tablet TAKE 5 TABLETS TWICE A DAY 300 tablet 11  . carvedilol (COREG) 25 MG tablet Take 1 tablet (25 mg total) by mouth 2 (two) times daily with a meal. 60 tablet 11  . cephALEXin (KEFLEX) 250 MG capsule Take 250 mg four times a day for 7 days 28 capsule 0  . clopidogrel (PLAVIX) 75 MG tablet TAKE 1 TABLET DAILY KEEP OFFICE VISIT (Patient not taking: Reported on 04/27/2016) 90 tablet 1  . DULoxetine (CYMBALTA) 60 MG capsule Take 60 mg by mouth daily.     Marland Kitchen estradiol (VIVELLE-DOT) 0.05 MG/24HR patch Place 1 patch onto the skin 2 (two) times a week. Sunday, Wednesday    . ethosuximide (ZARONTIN) 250 MG capsule Take 2 capsule (500mg ) in the morning, , and 2 capsules (500mg ) at night 120 capsule 11  . ezetimibe (ZETIA) 10 MG tablet Take 1 tablet by mouth once daily.  90 tablet 3  . isosorbide mononitrate (IMDUR) 60 MG 24 hr tablet Take 1 tablet (60 mg total) by mouth daily. 30 tablet 6  . levothyroxine (SYNTHROID, LEVOTHROID) 50 MCG tablet Take 50 mcg by mouth daily.     . nitroGLYCERIN (NITROSTAT) 0.4 MG SL tablet Place 1 tablet (0.4 mg total) under the tongue every 5 (five) minutes as needed. For chest pain. (Patient taking differently: Place 0.4 mg under the tongue every 5 (five) minutes as needed for chest pain. ) 25  tablet 6  . tiZANidine (ZANAFLEX) 2 MG tablet Take 4 tablets by mouth at bedtime.   2   No current facility-administered medications for this visit.     Allergies:   Aspirin; Erythromycin; Lisinopril; Niacin and related; Statins; Sulfa drugs cross reactors; Tetracyclines & related; and Penicillins    Social History:  The patient  reports that she quit smoking about 35 years ago. Her smoking use included Cigarettes. She has a 6.00 pack-year smoking history. She has never used smokeless tobacco. She reports that she drinks about 0.6 oz of alcohol per week . She reports that she does not use drugs.   Family History:  The patient's family history includes Cancer in her  mother; Heart attack in her father; Heart attack (age of onset: 49) in her brother; Heart disease in her father.    ROS:  Please see the history of present illness.   Otherwise, review of systems are positive for none.   All other systems are reviewed and negative.    PHYSICAL EXAM: VS:  Pulse 84  , BMI There is no height or weight on file to calculate BMI. GENERAL:  Well appearing HEENT:  Pupils equal round and reactive, fundi not visualized, oral mucosa unremarkable NECK:  No jugular venous distention, waveform within normal limits, carotid upstroke brisk and symmetric, no bruits, no thyromegaly LYMPHATICS:  No cervical adenopathy LUNGS:  Clear to auscultation bilaterally HEART:  RRR.  PMI not displaced or sustained,S1 and S2 within normal limits, no S3, no S4, no clicks, no rubs, no murmurs ABD:  Flat, positive bowel sounds normal in frequency in pitch, no bruits, no rebound, no guarding, no midline pulsatile mass, no hepatomegaly, no splenomegaly EXT:  2 plus pulses throughout, no edema, no cyanosis no clubbing SKIN:  No rashes no nodules NEURO:  Cranial nerves II through XII grossly intact, motor grossly intact throughout PSYCH:  Cognitively intact, oriented to person place and time   EKG:  EKG is ordered today. The  ekg ordered today demonstrates sinus rhythm.  Rate 84 bpm.    Recent Labs: 11/10/2015: Hemoglobin 12.6 04/27/2016: ALT 14; BUN 15; Creatinine, Ser 0.66; Platelets 273; Potassium 5.2; Sodium 139    Lipid Panel    Component Value Date/Time   CHOL 232 (H) 11/06/2015 0223   TRIG 102 11/06/2015 0223   HDL 80 11/06/2015 0223   CHOLHDL 2.9 11/06/2015 0223   VLDL 20 11/06/2015 0223   LDLCALC 132 (H) 11/06/2015 0223      Wt Readings from Last 3 Encounters:  04/27/16 88.5 kg (195 lb)  03/23/16 87.6 kg (193 lb 3.2 oz)  11/26/15 88 kg (194 lb)      ASSESSMENT AND PLAN:  # Unstabnle agina: Ms. Henly symptoms are concerning for ischemia.  She has known, severe CAD and a full metal jacket RCA.  We will increase her Imdur to 30mg  daily.  Continue aspirin, clopidogrel, and amlodipine.  We will check a troponin and plan for [facility] tomorrow due to her high pre-test probability of ischemia.  # Hypertension: Continue carvedilol and amlodipine.  # Hyperlipidemia: Continue Zetia.  She hasn't tolerated statins and isn't interested in PCSK9 inhibitors.  Hopefully she will reconsider.  Current medicines are reviewed at length with the patient today.  The patient does not have concerns regarding medicines.  The following changes have been made:  Increase Imdur to 60mg  daily  Labs/ tests ordered today:  Orders Placed This Encounter  Procedures  . CBC  . Protime-INR  . Basic metabolic panel  . Troponin I  . EKG 12-Lead  . LEFT HEART CATHETERIZATION WITH CORONARY ANGIOGRAM     Disposition:   FU with Dr. [name] after cath.     This note was written with the assistance of speech recognition software.  Please excuse any transcriptional errors.  Signed, Deontrae Drinkard C. Oval Linsey, MD, [facility]  06/28/2016 5:20 PM    [facility]

## 2016-06-30 ENCOUNTER — Telehealth: Payer: Self-pay | Admitting: Cardiology

## 2016-06-30 DIAGNOSIS — E785 Hyperlipidemia, unspecified: Secondary | ICD-10-CM | POA: Diagnosis not present

## 2016-06-30 DIAGNOSIS — I10 Essential (primary) hypertension: Secondary | ICD-10-CM | POA: Diagnosis not present

## 2016-06-30 DIAGNOSIS — I25110 Atherosclerotic heart disease of native coronary artery with unstable angina pectoris: Secondary | ICD-10-CM | POA: Diagnosis not present

## 2016-06-30 DIAGNOSIS — I200 Unstable angina: Secondary | ICD-10-CM | POA: Diagnosis not present

## 2016-06-30 DIAGNOSIS — T82858A Stenosis of vascular prosthetic devices, implants and grafts, initial encounter: Secondary | ICD-10-CM | POA: Diagnosis not present

## 2016-06-30 LAB — CBC
HCT: 36.9 % (ref 36.0–46.0)
HEMOGLOBIN: 12.4 g/dL (ref 12.0–15.0)
MCH: 29.5 pg (ref 26.0–34.0)
MCHC: 33.6 g/dL (ref 30.0–36.0)
MCV: 87.9 fL (ref 78.0–100.0)
PLATELETS: 250 10*3/uL (ref 150–400)
RBC: 4.2 MIL/uL (ref 3.87–5.11)
RDW: 12.9 % (ref 11.5–15.5)
WBC: 5.8 10*3/uL (ref 4.0–10.5)

## 2016-06-30 LAB — BASIC METABOLIC PANEL
ANION GAP: 8 (ref 5–15)
BUN: 10 mg/dL (ref 6–20)
CALCIUM: 8.3 mg/dL — AB (ref 8.9–10.3)
CHLORIDE: 104 mmol/L (ref 101–111)
CO2: 25 mmol/L (ref 22–32)
CREATININE: 0.55 mg/dL (ref 0.44–1.00)
GFR calc non Af Amer: >60 mL/min (ref >60)
Glucose, Bld: 105 mg/dL — ABNORMAL HIGH (ref 65–99)
Potassium: 3.9 mmol/L (ref 3.5–5.1)
SODIUM: 137 mmol/L (ref 135–145)

## 2016-06-30 LAB — POCT ACTIVATED CLOTTING TIME
ACTIVATED CLOTTING TIME: 219 s
Activated Clotting Time: 241 seconds

## 2016-06-30 MED ORDER — ASPIRIN 81 MG PO CHEW: 81.0000 mg | CHEWABLE_TABLET | Freq: Every day | ORAL | Status: DC

## 2016-06-30 MED ORDER — CLOPIDOGREL BISULFATE 75 MG PO TABS: 75.0000 mg | ORAL_TABLET | Freq: Every day | ORAL | 12 refills | Status: DC

## 2016-06-30 NOTE — Telephone Encounter (Signed)
TCM Phone Call . Appt is on 07/10/16 at 10:30am  W/ Rhonda Barrett.. Thanks

## 2016-06-30 NOTE — Discharge Summary (Signed)
Discharge Summary    Patient ID: Rachel Clark,  MRN: [id number], DOB/AGE: March 13, 1951 65 y.o.  Admit date: 06/29/2016 Discharge date: 06/30/2016  Primary Care Provider: [facility] Primary Cardiologist: Dr. [name]   Discharge Diagnoses    Principal Problem:   Unstable angina [facility]) Active Problems:   Coronary artery disease, post CABG 2007    Allergies Allergies  Allergen Reactions  . Aspirin Other (See Comments)    GI upset  . Erythromycin Nausea Only    Gi upset  . Lisinopril Cough  . Niacin And Related     Pt does not recall reactions   . Statins     Muscle pain  . Sulfa Drugs Cross Reactors Swelling  . Tetracyclines & Related Nausea Only  . Penicillins Nausea Only and Rash    Has patient had a PCN reaction causing immediate rash, facial/tongue/throat swelling, SOB or lightheadedness with hypotension: Yes Has patient had a PCN reaction causing severe rash involving mucus membranes or skin necrosis: No Has patient had a PCN reaction that required hospitalization No Has patient had a PCN reaction occurring within the last 10 years: No If all of the above answers are "NO", then may proceed with Cephalosporin use.     Diagnostic Studies/Procedures    Heart catheterization 06/29/16 Conclusions: 1. Significant 2-vessel coronary artery disease, including diffuse proximal/mid LAD disease of up to 95%, diffuse distal LAD disease of up to 75%, and 60% proximal RCA in-stent restenosis (FFR 0.75). 2. Mildly elevated left ventricular filling pressure (LVEDP 20 mmHg). 3. Basal inferior akinesis with otherwise preserved left ventricular contraction (LVEF 55-65%). 4. Successful FFR-guided balloon angioplasty of ostial/proximal RCA using 2.75 x 15 mm non-compliant balloon inflated to 20 atm.  Final angiogram demonstrates <10% residual stenosis with TIMI-3 flow.  Recommendations: 1. Continue dual antiplatelet therapy with aspirin and clopidogrel for at least 1  month, ideally indefinitely. 2. Aggressive secondary prevention, including consideration of PCSK9 inhibitor, given statin intolerance. 3. Medical therapy for diffuse disease involving D1 and distal LAD (small vessels).  _____________   History of Present Illness     [name] is a 65 year old female with an extensive history of CAD. She had CABG in 2007 (LIMA-LAD, SVG-RCA), full metal jacket stenting to her RCA in May 2017. Also with history of colon cancer, HLD, epilepsy, and sss s/p pacemaker insertion.   She presented to our office on 06/28/16 with exertional chest pain with associated SOB. This was reminiscent of her previous angina so she was scheduled for heart cath on 06/29/16.   Hospital Course     She underwent heart cath on 06/29/16 and was found to have 60% in stent restenosis of her proximal RCA stent (FFR 0.75). She underwent successful FFR guided-balloon angioplasty of her ostial/proximal RCA with TIMI-3 flow.   She will continue dual antiplatelet therapy indefinitely. She will need a referral to the lipid clinic for PCSK9 inhibitor as she is statin intolerant and has diffuse, reoccurring CAD.   She will continue on isosorbide, amlodipine, and Coreg. Her right radial site was stable without hematoma.   She was seen today by Dr. Claiborne Billings and deemed suitable for discharge.  _____________  Discharge Vitals Blood pressure 130/76, pulse 68, temperature 97.6 F (36.4 C), temperature source Oral, resp. rate 19, height _0  (1.753 m), weight 199 lb 12.8 oz (90.6 kg), SpO2 98 %.  Filed Weights   06/29/16 0807 06/30/16 0401  Weight: 195 lb (88.5 kg) 199 lb 12.8 oz (  90.6 kg)   Physical Exam  GENERAL:  Well appearing HEENT:  Pupils equal round and reactive, fundi not visualized, oral mucosa unremarkable NECK:  No jugular venous distention, waveform within normal limits, carotid upstroke brisk and symmetric, no bruits, no thyromegaly LYMPHATICS:  No cervical adenopathy LUNGS:   Clear to auscultation bilaterally HEART:  RRR.  PMI not displaced or sustained,S1 and S2 within normal limits, no S3, no S4, no clicks, no rubs, no murmurs ABD:  Flat, positive bowel sounds normal in frequency in pitch, no bruits, no rebound, no guarding, no midline pulsatile mass, no hepatomegaly, no splenomegaly EXT:  2 plus pulses throughout, no edema, no cyanosis no clubbing SKIN:  No rashes no nodules NEURO:  Cranial nerves II through XII grossly intact, motor grossly intact throughout [facility]:  Cognitively intact, oriented to person place and time  Labs & Radiologic Studies     CBC  Recent Labs  06/28/16 1628 06/30/16 0607  WBC 8.3 5.8  HGB 14.1 12.4  HCT 42.1 36.9  MCV 88.6 87.9  PLT 311 993   Basic Metabolic Panel  Recent Labs  06/28/16 1628 06/30/16 0607  NA 136 137  K 4.9 3.9  CL 101 104  CO2 26 25  GLUCOSE 117* 105*  BUN 19 10  CREATININE 0.67 0.55  CALCIUM 9.2 8.3*   Cardiac Enzymes  Recent Labs  06/28/16 1628  TROPONINI 0.01     Disposition   Pt is being discharged home today in good condition.  Follow-up Plans & Appointments    Follow-up Information    Barrett, Suanne Marker, PA-C Follow up on 07/10/2016.   Specialties:  Cardiology, Radiology Why:  at 10:30am for hospital follow up  Contact information: [address] [place] [address] [place] Follow up on 07/06/2016.   Specialty:  Cardiology Why:  at 8:45 am for an appt. with Claiborne Billings, the Pharmacist at the [facility] office.  Contact information: [address] [place] [phone]         Discharge Instructions    Amb Referral to Cardiac Rehabilitation    Complete by:  As directed    Diagnosis:  PTCA   Diet - low sodium heart healthy    Complete by:  As directed    Diet - low sodium heart healthy    Complete by:  As directed    Increase activity slowly    Complete by:  As directed     Increase activity slowly    Complete by:  As directed       Discharge Medications   Current Discharge Medication List    START taking these medications   Details  aspirin 81 MG chewable tablet Chew 1 tablet (81 mg total) by mouth daily.    clopidogrel (PLAVIX) 75 MG tablet Take 1 tablet (75 mg total) by mouth daily with breakfast. Qty: 30 tablet, Refills: 12      CONTINUE these medications which have NOT CHANGED   Details  acetaminophen (TYLENOL) 500 MG tablet Take 500-1,000 mg by mouth daily as needed for mild pain or headache.    amLODipine (NORVASC) 10 MG tablet Take 10 mg by mouth daily.     Azelastine HCl 0.15 % SOLN Place 1 spray into both nostrils daily as needed (seasonal allergies).  Refills: 12    carbamazepine (TEGRETOL XR) 100 MG 12 hr tablet TAKE 5 TABLETS TWICE A DAY Qty: 300 tablet,  Refills: 11    carvedilol (COREG) 12.5 MG tablet Take 6.25 mg by mouth 2 (two) times daily with a meal.    DULoxetine (CYMBALTA) 60 MG capsule Take 60 mg by mouth daily.     estradiol (VIVELLE-DOT) 0.05 MG/24HR patch Place 1 patch onto the skin 2 (two) times a week. Sunday, Wednesday    ethosuximide (ZARONTIN) 250 MG capsule Take 2 capsule (515m) in the morning, , and 2 capsules (5048m at night Qty: 120 capsule, Refills: 11    ezetimibe (ZETIA) 10 MG tablet Take 1 tablet by mouth once daily.  Qty: 90 tablet, Refills: 3    isosorbide mononitrate (IMDUR) 30 MG 24 hr tablet Take 30 mg by mouth daily.    levothyroxine (SYNTHROID, LEVOTHROID) 50 MCG tablet Take 50 mcg by mouth daily.     nitroGLYCERIN (NITROSTAT) 0.4 MG SL tablet Place 1 tablet (0.4 mg total) under the tongue every 5 (five) minutes as needed. For chest pain. Qty: 25 tablet, Refills: 6    tiZANidine (ZANAFLEX) 2 MG tablet Take 2-4 mg by mouth at bedtime.  Refills: 2         Aspirin prescribed at discharge?  Yes High Intensity Statin Prescribed? (Lipitor 40-8070mr Crestor 20-8m64mNo: Statin intolerant    Beta Blocker Prescribed? Yes For EF 40% or less, Was ACEI/ARB Prescribed? No: EF normal  ADP Receptor Inhibitor Prescribed? (i.e. Plavix etc.-Includes Medically Managed Patients): Yes For EF <40%, Aldosterone Inhibitor Prescribed? No: EF normal  Was EF assessed during THIS hospitalization? No: Last Echo in Jan 2017 Was Cardiac Rehab II ordered? (Included Medically managed Patients): Yes   Outstanding Labs/Studies     Duration of Discharge Encounter   Greater than 30 minutes including physician time.  Signed, ErinArbutus Leas12/29/2017, 10:33 AM   Patient seen and examined. Agree with assessment and plan. Pt feels well. No chest pain. She reportedly is statin intolerant and has consistently elevated LDL on zetia. I had a liong discussion with her concerning PCSK9 inhibition and the recent beneficial outcome data as well as plaque regression data with [facility]. Will arrange for PharmD assessment to initiate the approval process.  Groin site is stable; post PCI to native RCA in-stent restenosis yesterday.  DC today.  ThomTroy Sine, FACCGritman Medical Center29/2017 10:49 AM

## 2016-06-30 NOTE — Progress Notes (Signed)
CARDIAC REHAB PHASE I   PRE:  Rate/Rhythm: 68 SR  BP:  Supine: 130/76  Sitting:   Standing:    SaO2:   MODE:  Ambulation: 800 ft   POST:  Rate/Rhythm: 97 SR  BP:  Supine: 170/59  Sitting:   Standing:    SaO2:  0805-0853 Pt walked 800 ft with steady gait and no CP. Tolerated well. Education completed with pt who voiced understanding. Reviewed NTG use, risk factors, ex ed and heart healthy diet. Discussed CRP 2 and will refer to [place]. Pt is under a lot of stress with son's health who is dealing with epilepsy.    [name], RN BSN  06/30/2016 8:48 AM

## 2016-06-30 NOTE — Progress Notes (Signed)
Tech offered Pt a bath. Pt stated she will do bath/shower once she arrives home today. Tech asked Pt to notify her if she changes her mind. Pt acknowledged.

## 2016-07-04 ENCOUNTER — Telehealth: Payer: Self-pay

## 2016-07-04 NOTE — Telephone Encounter (Signed)
Discharged 06/30/16 TOC call attempt #1 made 07/04/16

## 2016-07-04 NOTE — Telephone Encounter (Signed)
Received clearance from [facility] advised cannot stop Plavix now.Pt just had a coronary intervention in December.Faxed back to fax # [phone].

## 2016-07-05 NOTE — Telephone Encounter (Signed)
TOC call attempt #2 - LM Encounter closed  LM w/appt info

## 2016-07-06 ENCOUNTER — Ambulatory Visit (INDEPENDENT_AMBULATORY_CARE_PROVIDER_SITE_OTHER): Payer: BLUE CROSS/BLUE SHIELD | Admitting: Pharmacist Clinician (PhC)/ Clinical Pharmacy Specialist

## 2016-07-06 ENCOUNTER — Telehealth: Payer: Self-pay

## 2016-07-06 ENCOUNTER — Other Ambulatory Visit: Payer: Self-pay | Admitting: Hematology and Oncology

## 2016-07-06 ENCOUNTER — Encounter: Payer: Self-pay | Admitting: Pharmacist Clinician (PhC)/ Clinical Pharmacy Specialist

## 2016-07-06 DIAGNOSIS — E785 Hyperlipidemia, unspecified: Secondary | ICD-10-CM

## 2016-07-06 DIAGNOSIS — D472 Monoclonal gammopathy: Secondary | ICD-10-CM

## 2016-07-06 MED ORDER — ROSUVASTATIN CALCIUM 5 MG PO TABS: ORAL_TABLET | ORAL | 3 refills | Status: DC

## 2016-07-06 NOTE — Telephone Encounter (Signed)
She is still overdue for her colon cancer follow-up and [facility] follow-up If she does not want future follow-up here, she needs her PCP/Dr. Michail Sermon to do colon cancer screening She needs to see a hematologist for [facility]US follow-up If she does not want CT we can cancel CT but she would still need labs & return visit. We can move labs to be done on same day 1/9

## 2016-07-06 NOTE — Progress Notes (Signed)
07/06/2016 Rachel Clark Nov 10, 1950 [id number]   HPI:  Rachel Clark is a 66 y.o. female patient of Dr [name], who presents today for a lipid clinic evaluation.  She has significant cardiovascular history including CABG in 2007, stenting to RCA in May 2017 and balloon angioplasty in Dec 2017.  Her most recent LDL cholesterol was in May 2017 at 132, however was as high as 170 in 2016.    Current Medications:  Ezetimibe 10 mg daily  Cholesterol Goals:   LDL goal < 70  Intolerant/previously tried:  States intolerance to statins, this goes back to when she lived in [place].  She cannot recall exactly which meds she took, and believes her reactions were GI related.  Our charting shows she tried pitivistatin 2 mg for about 2-3 weeks in April of 2016, but developed muscle aches.    Family history:   Father died from MI at age 40, sister died from dementia issues at age 34,   Diet:   Eats a high fiber diet most days, plenty of beans, oatmeal and nuts.  No canned/boxed/prepared foods, mostly fresh or frozen; no beef, mainly port and chicken  Exercise:    Walks her dog every few hours each day for 10-15 minutes, about 7 times per day.    Labs:  11/2015:    TC 232, TG 102, HDL 80, LDL 132, nonHDL 152 (zetia 10 mg) 03/2015:    TC 248, TG 94, HDL 65, LDL 164, nonHDL 183 (zetia 10 mg) 10/2014:    TC 271, TG 109, HDL 79.1, LDL 170, nonHDL 191.9 (zetia 10 mg) 06/2014:  TC 273, TG 131, HDL 71.2, LDL 176, nonHDL 201.8 08/2011:    TC 268, TG 107, HDL 82, LDL 165, nonHDL 186  Current Outpatient Prescriptions  Medication Sig Dispense Refill  . acetaminophen (TYLENOL) 500 MG tablet Take 500-1,000 mg by mouth daily as needed for mild pain or headache.    Marland Kitchen amLODipine (NORVASC) 10 MG tablet Take 10 mg by mouth daily.     Marland Kitchen aspirin 81 MG chewable tablet Chew 1 tablet (81 mg total) by mouth daily.    . Azelastine HCl 0.15 % SOLN Place 1 spray into both nostrils daily as needed (seasonal allergies).    12  . carbamazepine (TEGRETOL XR) 100 MG 12 hr tablet TAKE 5 TABLETS TWICE A DAY 300 tablet 11  . carvedilol (COREG) 12.5 MG tablet Take 6.25 mg by mouth 2 (two) times daily with a meal.    . clopidogrel (PLAVIX) 75 MG tablet Take 1 tablet (75 mg total) by mouth daily with breakfast. 30 tablet 12  . DULoxetine (CYMBALTA) 60 MG capsule Take 60 mg by mouth daily.     Marland Kitchen estradiol (VIVELLE-DOT) 0.05 MG/24HR patch Place 1 patch onto the skin 2 (two) times a week. Sunday, Wednesday    . ethosuximide (ZARONTIN) 250 MG capsule Take 2 capsule (500mg ) in the morning, , and 2 capsules (500mg ) at night 120 capsule 11  . ezetimibe (ZETIA) 10 MG tablet Take 1 tablet by mouth once daily.  90 tablet 3  . isosorbide mononitrate (IMDUR) 30 MG 24 hr tablet Take 30 mg by mouth daily.    Marland Kitchen levothyroxine (SYNTHROID, LEVOTHROID) 50 MCG tablet Take 50 mcg by mouth daily.     . nitroGLYCERIN (NITROSTAT) 0.4 MG SL tablet Place 1 tablet (0.4 mg total) under the tongue every 5 (five) minutes as needed. For chest pain. (Patient taking differently: Place 0.4 mg under the tongue  every 5 (five) minutes as needed for chest pain. ) 25 tablet 6  . rosuvastatin (CRESTOR) 5 MG tablet Take 1 tablet by mouth up to 3 times weekly as directed 12 tablet 3  . tiZANidine (ZANAFLEX) 2 MG tablet Take 2-4 mg by mouth at bedtime.   2   No current facility-administered medications for this visit.     Allergies  Allergen Reactions  . Aspirin Other (See Comments)    GI upset  . Erythromycin Nausea Only    Gi upset  . Lisinopril Cough  . Niacin And Related     Pt does not recall reactions   . Statins     Muscle pain  . Sulfa Drugs Cross Reactors Swelling  . Tetracyclines & Related Nausea Only  . Penicillins Nausea Only and Rash    Has patient had a PCN reaction causing immediate rash, facial/tongue/throat swelling, SOB or lightheadedness with hypotension: Yes Has patient had a PCN reaction causing severe rash involving mucus membranes  or skin necrosis: No Has patient had a PCN reaction that required hospitalization No Has patient had a PCN reaction occurring within the last 10 years: No If all of the above answers are "NO", then may proceed with Cephalosporin use.     Past Medical History:  Diagnosis Date  . Arthritis    "fingers" (06/29/2016)  . Chronic lower back pain   . Colon cancer ([place]) 12/04/2011   s/p Laparoscopic-assisted transverse colectomy on 12/19/2011 by Dr. Donne Hazel.  pT3 N0 M0.   . Coronary artery disease    a. remote NSTEMI with PCI to the RCA; b. S/P CABG in 2007; c. 2010 - 2 v CAD with L-LAD and S-RCA patent, small caliber Dx 70% (treated medically - no amenable to PCI), CFX free of significant disease, normal LVF. d. 10/2014 Lexi MV: EF 61%, no ischemia/infarct.  . Depression   . Dyslipidemia   . Fibromyalgia   . Gastric ulcer   . GERD (gastroesophageal reflux disease)   . 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 Headache    "weekly" (06/29/2016)  . Hyperlipidemia   . Hypothyroid   . Iron deficiency anemia 11/17/2011  . Monoclonal gammopathy of unknown significance   . Myocardial infarction 2007  . Presence of permanent cardiac pacemaker   . Syncope and collapse    pacemaker implanted  . Unstable angina (H[facility])     There were no vitals taken for this visit.   Dyslipidemia Had a long discussion with patient about medication options.  She was interested in trying Cholestoff and a little hesitant about PCSK-9 therapy.  We also discussed options of Museum/gallery conservator and ORION-10.  Because her labs are 31 months old, will have her have repeat today for a new baseline.  She is willing to re-challenge with a statin, as we don't have enough information at this time about her statin history.  Will start her on rosuvastatin 5 mg weekly and have her increase by 1 tablet each month to a maximum dose of 15 mg per  week.  Will have her return in 3 months for follow up and potential PCSK-9 or study option at that time.     Tommy Medal PharmD CPP [facility]

## 2016-07-06 NOTE — Assessment & Plan Note (Signed)
Had a long discussion with patient about medication options.  She was interested in trying Cholestoff and a little hesitant about PCSK-9 therapy.  We also discussed options of Museum/gallery conservator and ORION-10.  Because her labs are 77 months old, will have her have repeat today for a new baseline.  She is willing to re-challenge with a statin, as we don't have enough information at this time about her statin history.  Will start her on rosuvastatin 5 mg weekly and have her increase by 1 tablet each month to a maximum dose of 15 mg per week.  Will have her return in 3 months for follow up and potential PCSK-9 or study option at that time.

## 2016-07-06 NOTE — Telephone Encounter (Signed)
S/w pt about Dr Alvy Bimler message and she wants to f/u with Dr Alvy Bimler. She asked we cancel CT and move lab to 1/9. inbasket sent to scheduler for lab and CT cancelled.

## 2016-07-06 NOTE — Patient Instructions (Signed)
Start rosuvastatin 5 mg once weekly for at least 4 weeks (may start with 1/2 tablet if you wish).  After 4 weeks, if you have no problems, increase your dose to twice weekly. Can increase to 3 times weekly after another month if you feel you are doing well  Start CholestOff (by Nautre Made) twice daily  Continue ezetimibe 10 mg daily   Cholesterol Cholesterol is a fat. Your body needs a small amount of cholesterol. Cholesterol (plaque) may build up in your blood vessels (arteries). That makes you more likely to have a heart attack or stroke. You cannot feel your cholesterol level. Having a blood test is the only way to find out if your level is high. Keep your test results. Work with your doctor to keep your cholesterol at a good level. What do the results mean?  Total cholesterol is how much cholesterol is in your blood.  LDL is bad cholesterol. This is the type that can build up. Try to have low LDL.  HDL is good cholesterol. It cleans your blood vessels and carries LDL away. Try to have high HDL.  Triglycerides are fat that the body can store or burn for energy. What are good levels of cholesterol?  Total cholesterol below 200.  LDL below 100 is good for people who have health risks. LDL below 70 is good for people who have very high risks.  HDL above 40 is good. It is best to have HDL of 60 or higher.  Triglycerides below 150. How can I lower my cholesterol? Diet  Follow your diet program as told by your doctor.  Choose fish, white meat chicken, or [place] that is roasted or baked. Try not to eat red meat, fried foods, sausage, or lunch meats.  Eat lots of fresh fruits and vegetables.  Choose whole grains, beans, pasta, potatoes, and cereals.  Choose olive oil, corn oil, or canola oil. Only use small amounts.  Try not to eat butter, mayonnaise, shortening, or palm kernel oils.  Try not to eat foods with trans fats.  Choose low-fat or nonfat dairy foods.  Drink skim or  nonfat milk.  Eat low-fat or nonfat yogurt and cheeses.  Try not to drink whole milk or cream.  Try not to eat ice cream, egg yolks, or full-fat cheeses.  Healthy desserts include angel food cake, ginger snaps, animal crackers, hard candy, popsicles, and low-fat or nonfat frozen yogurt. Try not to eat pastries, cakes, pies, and cookies. Exercise  Follow your exercise program as told by your doctor.  Be more active. Try gardening, walking, and taking the stairs.  Ask your doctor about ways that you can be more active. Medicine  Take over-the-counter and prescription medicines only as told by your doctor. This information is not intended to replace advice given to you by your health care provider. Make sure you discuss any questions you have with your health care provider. Document Released: 09/15/2008 Document Revised: 01/19/2016 Document Reviewed: 12/30/2015 Elsevier Interactive Patient Education  2017 [employer].

## 2016-07-06 NOTE — Telephone Encounter (Signed)
Pt called to cancel her CT 1/8 and appt with Dr Alvy Bimler 1/9. She stated she called in Dec worried her colon cancer is coming back. In the meantime she has seen Dr Michail Sermon and was in hospital for cardiac testing. She is feeling fine now b/c it was a heart problem. This nurse did not cancel appts for Dr Calton Dach input.

## 2016-07-07 ENCOUNTER — Other Ambulatory Visit: Payer: Self-pay | Admitting: Cardiology

## 2016-07-07 LAB — LIPID PANEL
Cholesterol: 249 mg/dL — ABNORMAL HIGH (ref <200)
HDL: 85 mg/dL (ref >50)
LDL CALC: 137 mg/dL — AB (ref <100)
Total CHOL/HDL Ratio: 2.9 Ratio (ref <5.0)
Triglycerides: 134 mg/dL (ref <150)
VLDL: 27 mg/dL (ref <30)

## 2016-07-07 LAB — HEPATIC FUNCTION PANEL
ALT: 10 U/L (ref 6–29)
AST: 10 U/L (ref 10–35)
Albumin: 4 g/dL (ref 3.6–5.1)
Alkaline Phosphatase: 69 U/L (ref 33–130)
Bilirubin, Direct: 0.1 mg/dL (ref <=0.2)
Indirect Bilirubin: 0.2 mg/dL (ref 0.2–1.2)
TOTAL PROTEIN: 6.9 g/dL (ref 6.1–8.1)
Total Bilirubin: 0.3 mg/dL (ref 0.2–1.2)

## 2016-07-10 ENCOUNTER — Ambulatory Visit: Payer: BLUE CROSS/BLUE SHIELD | Admitting: Physician Assistant

## 2016-07-10 ENCOUNTER — Ambulatory Visit (HOSPITAL_COMMUNITY): Payer: BLUE CROSS/BLUE SHIELD

## 2016-07-10 ENCOUNTER — Other Ambulatory Visit: Payer: BLUE CROSS/BLUE SHIELD

## 2016-07-10 NOTE — Progress Notes (Deleted)
Cardiology Office Note   Date:  07/10/2016   ID:  Rachel Clark, DOB 07-11-1950, MRN [id number]  PCP:  Vidal Schwalbe, MD  Cardiologist:  Dr Oliver Barre, PA-C   No chief complaint on file.   History of Present Illness: Rachel Clark is a 66 y.o. female with a history of CABG 2007 and PCI, hypertension, hyperlipidemia, SSS s/p MDT PPM, hypothyroidism, and MGUS.   Admit 12/28-12/29 for USAP, s/p cath w/ PTCA RCA 01/04, office visit w/ K. Alvstad for lipid evaluation  Rachel Clark presents for ***   Past Medical History:  Diagnosis Date  . Arthritis    "fingers" (06/29/2016)  . Chronic lower back pain   . Colon cancer ([place]) 12/04/2011   s/p Laparoscopic-assisted transverse colectomy on 12/19/2011 by Dr. Donne Hazel.  pT3 N0 M0.   . Coronary artery disease    a. remote NSTEMI with PCI to the RCA; b. S/P CABG in 2007; c. 2010 - 2 v CAD with L-LAD and S-RCA patent, small caliber Dx 70% (treated medically - no amenable to PCI), CFX free of significant disease, normal LVF. d. 10/2014 Lexi MV: EF 61%, no ischemia/infarct.  . Depression   . Dyslipidemia   . Fibromyalgia   . Gastric ulcer   . GERD (gastroesophageal reflux disease)   . 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 Headache    "weekly" (06/29/2016)  . Hyperlipidemia   . Hypothyroid   . Iron deficiency anemia 11/17/2011  . Monoclonal gammopathy of unknown significance   . Myocardial infarction 2007  . Presence of permanent cardiac pacemaker   . Syncope and collapse    pacemaker implanted  . Unstable angina [facility])     Past Surgical History:  Procedure Laterality Date  . ANTERIOR CERVICAL DECOMP/DISCECTOMY FUSION  1980's  . BACK SURGERY    . CARDIAC CATHETERIZATION  05/20/2009   obstructive native vessel disease in LAD, RCA, and first diagonal, patent vein graft to distal RCA and LIMA to LAD,normal. ef 60%  . CARDIAC  CATHETERIZATION N/A 03/24/2015   Procedure: Left Heart Cath and Cors/Grafts Angiography;  Surgeon: Wellington Hampshire, MD;  Location: [place] CV [facility];  Service: Cardiovascular;  Laterality: N/A;  . CARDIAC CATHETERIZATION N/A 07/28/2015   Procedure: Left Heart Cath and Coronary Angiography;  Surgeon: Troy Sine, MD;  Location: [place] [facility];  Service: Cardiovascular;  Laterality: N/A;  . CARDIAC CATHETERIZATION N/A 11/09/2015   Procedure: Coronary Stent Intervention;  Surgeon: Sherren Mocha, MD;  Location: [place] [facility];  Service: Cardiovascular;  Laterality: N/A;  . CARDIAC CATHETERIZATION N/A 11/09/2015   Procedure: Left Heart Cath and Coronary Angiography;  Surgeon: Sherren Mocha, MD;  Location: [place] [facility];  Service: Cardiovascular;  Laterality: N/A;  . CARDIAC CATHETERIZATION N/A 06/29/2016   Procedure: Left Heart Cath and Coronary Angiography;  Surgeon: Nelva Bush, MD;  Location: [place] [facility];  Service: Cardiovascular;  Laterality: N/A;  . CARDIAC CATHETERIZATION N/A 06/29/2016   Procedure: Intravascular Pressure Wire/FFR Study;  Surgeon: Nelva Bush, MD;  Location: [place] [facility];  Service: Cardiovascular;  Laterality: N/A;  . CARDIAC CATHETERIZATION N/A 06/29/2016   Procedure: Coronary Balloon Angioplasty;  Surgeon: Nelva Bush, MD;  Location: [place] [facility];  Service: Cardiovascular;  Laterality: N/A;  . COLON RESECTION  12/19/2011   Procedure: COLON RESECTION LAPAROSCOPIC;  Surgeon: Rolm Bookbinder, MD;  Location: [place];  Service: General;  Laterality: N/A;  laparoscopic hand assisted partial colon resection  . COLON SURGERY    . COLONOSCOPY    . CORONARY ANGIOPLASTY WITH STENT PLACEMENT  ~ 2007   1 stent  . CORONARY ARTERY BYPASS GRAFT  2007   "CABG X2"  . DILATION AND CURETTAGE OF UTERUS  1973  . EP IMPLANTABLE DEVICE N/A 03/25/2015   MDT Advisa DR pacemaker implanted by Dr Rayann Heman for transient complete heart block and syncope  .  INSERT / REPLACE / REMOVE PACEMAKER    . PORT-A-CATH REMOVAL N/A 06/12/2013   Procedure: REMOVAL PORT-A-CATH;  Surgeon: Rolm Bookbinder, MD;  Location: [place];  Service: General;  Laterality: N/A;  . PORTACATH PLACEMENT  06/04/2012   Procedure: INSERTION PORT-A-CATH;  Surgeon: Rolm Bookbinder, MD;  Location: [place];  Service: General;  Laterality: N/A;  Insertion of port-a-cath   . POSTERIOR LAMINECTOMY / DECOMPRESSION CERVICAL SPINE  1990s  . VAGINAL HYSTERECTOMY      Current Outpatient Prescriptions  Medication Sig Dispense Refill  . acetaminophen (TYLENOL) 500 MG tablet Take 500-1,000 mg by mouth daily as needed for mild pain or headache.    Marland Kitchen amLODipine (NORVASC) 10 MG tablet Take 10 mg by mouth daily.     Marland Kitchen aspirin 81 MG chewable tablet Chew 1 tablet (81 mg total) by mouth daily.    . Azelastine HCl 0.15 % SOLN Place 1 spray into both nostrils daily as needed (seasonal allergies).   12  . carbamazepine (TEGRETOL XR) 100 MG 12 hr tablet TAKE 5 TABLETS TWICE A DAY 300 tablet 11  . carvedilol (COREG) 12.5 MG tablet Take 6.25 mg by mouth 2 (two) times daily with a meal.    . clopidogrel (PLAVIX) 75 MG tablet Take 1 tablet (75 mg total) by mouth daily with breakfast. 30 tablet 12  . DULoxetine (CYMBALTA) 60 MG capsule Take 60 mg by mouth daily.     Marland Kitchen estradiol (VIVELLE-DOT) 0.05 MG/24HR patch Place 1 patch onto the skin 2 (two) times a week. Sunday, Wednesday    . ethosuximide (ZARONTIN) 250 MG capsule Take 2 capsule (540m) in the morning, , and 2 capsules (5071m at night 120 capsule 11  . ezetimibe (ZETIA) 10 MG tablet Take 1 tablet by mouth once daily.  90 tablet 3  . isosorbide mononitrate (IMDUR) 30 MG 24 hr tablet Take 30 mg by mouth daily.    . Marland Kitchenevothyroxine (SYNTHROID, LEVOTHROID) 50 MCG tablet Take 50 mcg by mouth daily.     . nitroGLYCERIN (NITROSTAT) 0.4 MG SL tablet Place 1 tablet (0.4 mg total) under the tongue every 5 (five) minutes as needed. For chest  pain. (Patient taking differently: Place 0.4 mg under the tongue every 5 (five) minutes as needed for chest pain. ) 25 tablet 6  . rosuvastatin (CRESTOR) 5 MG tablet Take 1 tablet by mouth up to 3 times weekly as directed 12 tablet 3  . tiZANidine (ZANAFLEX) 2 MG tablet Take 2-4 mg by mouth at bedtime.   2   No current facility-administered medications for this visit.     Allergies:   Aspirin; Erythromycin; Lisinopril; Niacin and related; Statins; Sulfa drugs cross reactors; Tetracyclines & related; and Penicillins    Social History:  The patient  reports that she quit smoking about 35 years ago. Her smoking use included Cigarettes. She has a 6.00 pack-year smoking history. She has never used smokeless tobacco. She reports that she drinks about 0.6 oz of alcohol per week .  She reports that she does not use drugs.   Family History:  The patient's family history includes Cancer in her mother; Heart attack in her father; Heart attack (age of onset: 69) in her brother; Heart disease in her father.    ROS:  Please see the history of present illness. All other systems are reviewed and negative.    PHYSICAL EXAM: VS:  There were no vitals taken for this visit. , BMI There is no height or weight on file to calculate BMI. GEN: Well nourished, well developed, female in no acute distress  HEENT: normal for age  Neck: no JVD, no carotid bruit, no masses Cardiac: RRR; no murmur, no rubs, or gallops Respiratory:  clear to auscultation bilaterally, normal work of breathing GI: soft, nontender, nondistended, + BS MS: no deformity or atrophy; no edema; distal pulses are 2+ in all 4 extremities   Skin: warm and dry, no rash Neuro:  Strength and sensation are intact Psych: euthymic mood, full affect   EKG:  EKG {ACTION; IS/IS AFB:[id number]} ordered today. The ekg ordered today demonstrates ***  CATH: 12/29 Heart catheterization 06/29/16 Conclusions: 1. Significant 2-vessel coronary artery disease,  including diffuse proximal/mid LAD disease of up to 95%, diffuse distal LAD disease of up to 75%, and 60% proximal RCA in-stent restenosis (FFR 0.75). 2. Mildly elevated left ventricular filling pressure (LVEDP 20 mmHg). 3. Basal inferior akinesis with otherwise preserved left ventricular contraction (LVEF 55-65%). 4. Successful FFR-guided balloon angioplasty of ostial/proximal RCA using 2.75 x 15 mm non-compliant balloon inflated to 20 atm. Final angiogram demonstrates <10% residual stenosis with TIMI-3 flow. Recommendations: 1. Continue dual antiplatelet therapy with aspirin and clopidogrel for at least 1 month, ideally indefinitely. 2. Aggressive secondary prevention, including consideration of PCSK9 inhibitor, given statin intolerance. 3. Medical therapy for diffuse disease involving D1 and distal LAD (small vessels).   Recent Labs: 06/30/2016: BUN 10; Creatinine, Ser 0.55; Hemoglobin 12.4; Platelets 250; Potassium 3.9; Sodium 137 07/07/2016: ALT 10    Lipid Panel    Component Value Date/Time   CHOL 249 (H) 07/07/2016 0937   TRIG 134 07/07/2016 0937   HDL 85 07/07/2016 0937   CHOLHDL 2.9 07/07/2016 0937   VLDL 27 07/07/2016 0937   LDLCALC 137 (H) 07/07/2016 0937     Wt Readings from Last 3 Encounters:  06/30/16 199 lb 12.8 oz (90.6 kg)  06/28/16 195 lb (88.5 kg)  04/27/16 195 lb (88.5 kg)     Other studies Reviewed: Additional studies/ records that were reviewed today include: ***.  ASSESSMENT AND PLAN:  1.  ***   Current medicines are reviewed at length with the patient today.  The patient {ACTIONS; HAS/DOES NOT HAVE:19233} concerns regarding medicines.  The following changes have been made:  {PLAN; NO CHANGE:13088:s}  Labs/ tests ordered today include: *** No orders of the defined types were placed in this encounter.    Disposition:   FU with ***  Signed, Lenoard Aden  07/10/2016 8:39 AM    [facility] Phone: [phone];  Fax: [phone]  This note was written with the assistance of speech recognition software. Please excuse any transcriptional errors.

## 2016-07-11 ENCOUNTER — Encounter: Payer: Self-pay | Admitting: Hematology and Oncology

## 2016-07-11 ENCOUNTER — Encounter: Payer: Self-pay | Admitting: *Deleted

## 2016-07-11 ENCOUNTER — Ambulatory Visit (HOSPITAL_BASED_OUTPATIENT_CLINIC_OR_DEPARTMENT_OTHER): Payer: BLUE CROSS/BLUE SHIELD | Admitting: Hematology and Oncology

## 2016-07-11 ENCOUNTER — Other Ambulatory Visit (HOSPITAL_BASED_OUTPATIENT_CLINIC_OR_DEPARTMENT_OTHER): Payer: BLUE CROSS/BLUE SHIELD

## 2016-07-11 ENCOUNTER — Telehealth: Payer: Self-pay | Admitting: Hematology and Oncology

## 2016-07-11 VITALS — BP 128/72 | HR 73 | Temp 97.5°F | Resp 17 | Ht 69.0 in | Wt 194.7 lb

## 2016-07-11 DIAGNOSIS — C189 Malignant neoplasm of colon, unspecified: Secondary | ICD-10-CM

## 2016-07-11 DIAGNOSIS — D472 Monoclonal gammopathy: Secondary | ICD-10-CM | POA: Diagnosis not present

## 2016-07-11 DIAGNOSIS — Z85038 Personal history of other malignant neoplasm of large intestine: Secondary | ICD-10-CM

## 2016-07-11 DIAGNOSIS — I2510 Atherosclerotic heart disease of native coronary artery without angina pectoris: Secondary | ICD-10-CM

## 2016-07-11 LAB — CBC WITH DIFFERENTIAL/PLATELET
BASO%: 0.5 % (ref 0.0–2.0)
Basophils Absolute: 0 10*3/uL (ref 0.0–0.1)
EOS%: 7.2 % — ABNORMAL HIGH (ref 0.0–7.0)
Eosinophils Absolute: 0.5 10*3/uL (ref 0.0–0.5)
HEMATOCRIT: 39.1 % (ref 34.8–46.6)
HGB: 13.3 g/dL (ref 11.6–15.9)
LYMPH#: 1.9 10*3/uL (ref 0.9–3.3)
LYMPH%: 29.1 % (ref 14.0–49.7)
MCH: 30 pg (ref 25.1–34.0)
MCHC: 34 g/dL (ref 31.5–36.0)
MCV: 88.3 fL (ref 79.5–101.0)
MONO#: 0.4 10*3/uL (ref 0.1–0.9)
MONO%: 6.6 % (ref 0.0–14.0)
NEUT%: 56.6 % (ref 38.4–76.8)
NEUTROS ABS: 3.7 10*3/uL (ref 1.5–6.5)
PLATELETS: 279 10*3/uL (ref 145–400)
RBC: 4.43 10*6/uL (ref 3.70–5.45)
RDW: 12.7 % (ref 11.2–14.5)
WBC: 6.5 10*3/uL (ref 3.9–10.3)

## 2016-07-11 LAB — CEA (IN HOUSE-CHCC): CEA (CHCC-IN HOUSE): 1.74 ng/mL (ref 0.00–5.00)

## 2016-07-11 LAB — COMPREHENSIVE METABOLIC PANEL
ALT: 14 U/L (ref 0–55)
ANION GAP: 6 meq/L (ref 3–11)
AST: 13 U/L (ref 5–34)
Albumin: 3.6 g/dL (ref 3.5–5.0)
Alkaline Phosphatase: 89 U/L (ref 40–150)
BILIRUBIN TOTAL: 0.29 mg/dL (ref 0.20–1.20)
BUN: 15.6 mg/dL (ref 7.0–26.0)
CALCIUM: 8.9 mg/dL (ref 8.4–10.4)
CO2: 27 meq/L (ref 22–29)
CREATININE: 0.8 mg/dL (ref 0.6–1.1)
Chloride: 103 mEq/L (ref 98–109)
EGFR: 82 mL/min/{1.73_m2} — ABNORMAL LOW (ref >90)
Glucose: 115 mg/dl (ref 70–140)
Potassium: 4.4 mEq/L (ref 3.5–5.1)
Sodium: 137 mEq/L (ref 136–145)
TOTAL PROTEIN: 7.1 g/dL (ref 6.4–8.3)

## 2016-07-11 LAB — FERRITIN: Ferritin: 19 ng/ml (ref 9–269)

## 2016-07-11 NOTE — Assessment & Plan Note (Signed)
The patient has significant coronary artery disease with recurrent hospitalization. She will continue risk factor modification and close follow-up with cardiologist

## 2016-07-11 NOTE — Assessment & Plan Note (Signed)
She has no signs of disease progression on blood work, physical examination and imaging study. Continue yearly visit with repeat blood work and imaging study as needed 

## 2016-07-11 NOTE — Assessment & Plan Note (Signed)
I will try to obtain the formal colonoscopy report from 2014 to see if any polyps were found. CT scan of the abdomen and pelvis and CEA were within normal limits. The patient appears to be cancer free. Per guidelines, I will continue tumor marker monitoring  Per patient request, CT scan was canceled

## 2016-07-11 NOTE — Telephone Encounter (Signed)
Appointments scheduled per 1/9 LOS. Patients given AVS report and calendars with future scheduled appointments.

## 2016-07-11 NOTE — Progress Notes (Signed)
[place] OFFICE PROGRESS NOTE  Patient Care Team: Harlan Stains, MD as PCP - General (Family Medicine) Wilford Corner, MD as Consulting Physician (Gastroenterology)  SUMMARY OF ONCOLOGIC HISTORY:   History of colon cancer   12/04/2011 Initial Diagnosis    Colon cancer      11/03/2014 Imaging    CT scan showed no evidence of cancer recurrence.      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 The patient have very minimal M spike and asymptomatic. #2 T3, N0, M0 colon cancer This was discovered when the patient presented with severe anemia. Colonoscopy revealed abnormal lesion and in 12/19/2011 Dr. Donne Hazel perform laparoscopic-assisted transverse colectomy. She received adjuvant Xeloda in August 2013 and switched to IV 5-FU in December 2013 due to poor tolerance to Xeloda. Her last chemotherapy is completed by March 2014. She has normal colonoscopy in July 2014. CT scan from 10/29/2013 show no evidence of recurrence.  INTERVAL HISTORY: Please see below for problem oriented charting. She returns for follow-up. She recently called recently with vague GI symptoms and thought that her colon cancer was back. She was seen by gastroenterologist and was subsequently hospitalized and found to have unstable angina. Since repeat PTCA, her symptoms has resolved She denies recent change in bowel habits She denies recent infection. No recent bone pain. She is compliant taking vitamin D supplement.   REVIEW OF SYSTEMS:   Constitutional: Denies fevers, chills or abnormal weight loss Eyes: Denies blurriness of vision Ears, nose, mouth, throat, and face: Denies mucositis or sore throat Respiratory: Denies cough, dyspnea or wheezes Cardiovascular: Denies palpitation, chest discomfort or lower extremity swelling Gastrointestinal:  Denies nausea, heartburn or change in bowel habits Skin: Denies abnormal skin rashes Lymphatics: Denies new lymphadenopathy or easy  bruising Neurological:Denies numbness, tingling or new weaknesses Behavioral/Psych: Mood is stable, no new changes  All other systems were reviewed with the patient and are negative.  I have reviewed the past medical history, past surgical history, social history and family history with the patient and they are unchanged from previous note.  ALLERGIES:  is allergic to aspirin; erythromycin; lisinopril; niacin and related; statins; sulfa drugs cross reactors; tetracyclines & related; and penicillins.  MEDICATIONS:  Current Outpatient Prescriptions  Medication Sig Dispense Refill  . acetaminophen (TYLENOL) 500 MG tablet Take 500-1,000 mg by mouth daily as needed for mild pain or headache.    Marland Kitchen amLODipine (NORVASC) 10 MG tablet Take 10 mg by mouth daily.     Marland Kitchen aspirin 81 MG chewable tablet Chew 1 tablet (81 mg total) by mouth daily.    . Azelastine HCl 0.15 % SOLN Place 1 spray into both nostrils daily as needed (seasonal allergies).   12  . carbamazepine (TEGRETOL XR) 100 MG 12 hr tablet TAKE 5 TABLETS TWICE A DAY 300 tablet 11  . carvedilol (COREG) 12.5 MG tablet Take 6.25 mg by mouth 2 (two) times daily with a meal.    . clopidogrel (PLAVIX) 75 MG tablet Take 1 tablet (75 mg total) by mouth daily with breakfast. 30 tablet 12  . DULoxetine (CYMBALTA) 60 MG capsule Take 60 mg by mouth daily.     Marland Kitchen estradiol (VIVELLE-DOT) 0.05 MG/24HR patch Place 1 patch onto the skin 2 (two) times a week. Sunday, Wednesday    . ethosuximide (ZARONTIN) 250 MG capsule Take 2 capsule (574m) in the morning, , and 2 capsules (5068m at night 120 capsule 11  . ezetimibe (ZETIA) 10 MG tablet Take 1 tablet by  mouth once daily.  90 tablet 3  . isosorbide mononitrate (IMDUR) 30 MG 24 hr tablet Take 30 mg by mouth daily.    Marland Kitchen levothyroxine (SYNTHROID, LEVOTHROID) 50 MCG tablet Take 50 mcg by mouth daily.     . nitroGLYCERIN (NITROSTAT) 0.4 MG SL tablet Place 1 tablet (0.4 mg total) under the tongue every 5 (five)  minutes as needed. For chest pain. (Patient taking differently: Place 0.4 mg under the tongue every 5 (five) minutes as needed for chest pain. ) 25 tablet 6  . rosuvastatin (CRESTOR) 5 MG tablet Take 1 tablet by mouth up to 3 times weekly as directed 12 tablet 3  . tiZANidine (ZANAFLEX) 2 MG tablet Take 2-4 mg by mouth at bedtime.   2   No current facility-administered medications for this visit.     PHYSICAL EXAMINATION: ECOG PERFORMANCE STATUS: 0 - Asymptomatic  Vitals:   07/11/16 0833  BP: 128/72  Pulse: 73  Resp: 17  Temp: 97.5 F (36.4 C)   Filed Weights   07/11/16 0833  Weight: 194 lb 11.2 oz (88.3 kg)    GENERAL:alert, no distress and comfortable SKIN: skin color, texture, turgor are normal, no rashes or significant lesions EYES: normal, Conjunctiva are pink and non-injected, sclera clear OROPHARYNX:no exudate, no erythema and lips, buccal mucosa, and tongue normal  NECK: supple, thyroid normal size, non-tender, without nodularity LYMPH:  no palpable lymphadenopathy in the cervical, axillary or inguinal LUNGS: clear to auscultation and percussion with normal breathing effort HEART: regular rate & rhythm and no murmurs and no lower extremity edema ABDOMEN:abdomen soft, non-tender and normal bowel sounds Musculoskeletal:no cyanosis of digits and no clubbing  NEURO: alert & oriented x 3 with fluent speech, no focal motor/sensory deficits  LABORATORY DATA:  I have reviewed the data as listed    Component Value Date/Time   NA 137 07/11/2016 0820   K 4.4 07/11/2016 0820   CL 104 06/30/2016 0607   CL 106 12/25/2012 0924   CO2 27 07/11/2016 0820   GLUCOSE 115 07/11/2016 0820   GLUCOSE 105 (H) 12/25/2012 0924   BUN 15.6 07/11/2016 0820   CREATININE 0.8 07/11/2016 0820   CALCIUM 8.9 07/11/2016 0820   PROT 7.1 07/11/2016 0820   ALBUMIN 3.6 07/11/2016 0820   AST 13 07/11/2016 0820   ALT 14 07/11/2016 0820   ALKPHOS 89 07/11/2016 0820   BILITOT 0.29 07/11/2016 0820    GFRNONAA >60 06/30/2016 0607   GFRAA >60 06/30/2016 0607    No results found for: SPEP, UPEP  Lab Results  Component Value Date   WBC 6.5 07/11/2016   NEUTROABS 3.7 07/11/2016   HGB 13.3 07/11/2016   HCT 39.1 07/11/2016   MCV 88.3 07/11/2016   PLT 279 07/11/2016      Chemistry      Component Value Date/Time   NA 137 07/11/2016 0820   K 4.4 07/11/2016 0820   CL 104 06/30/2016 0607   CL 106 12/25/2012 0924   CO2 27 07/11/2016 0820   BUN 15.6 07/11/2016 0820   CREATININE 0.8 07/11/2016 0820      Component Value Date/Time   CALCIUM 8.9 07/11/2016 0820   ALKPHOS 89 07/11/2016 0820   AST 13 07/11/2016 0820   ALT 14 07/11/2016 0820   BILITOT 0.29 07/11/2016 0820      ASSESSMENT & PLAN:  History of colon cancer I will try to obtain the formal colonoscopy report from 2014 to see if any polyps were found. CT scan of  the abdomen and pelvis and CEA were within normal limits. The patient appears to be cancer free. Per guidelines, I will continue tumor marker monitoring  Per patient request, CT scan was canceled  Monoclonal gammopathy of unknown significance She has no signs of disease progression on blood work, physical examination and imaging study. Continue yearly visit with repeat blood work and imaging study as needed  CAD, multiple vessel The patient has significant coronary artery disease with recurrent hospitalization. She will continue risk factor modification and close follow-up with cardiologist   Orders Placed This Encounter  Procedures  . CBC with Differential/Platelet    Standing Status:   Future    Standing Expiration Date:   02/12/2018  . Comprehensive metabolic panel    Standing Status:   Future    Standing Expiration Date:   02/12/2018  . Kappa/lambda light chains    Standing Status:   Future    Standing Expiration Date:   02/12/2018  . Multiple Myeloma Panel (SPEP&IFE w/QIG)    Standing Status:   Future    Standing Expiration Date:   02/12/2018  .  CEA    Standing Status:   Future    Standing Expiration Date:   02/12/2018   All questions were answered. The patient knows to call the clinic with any problems, questions or concerns. No barriers to learning was detected. I spent 15 minutes counseling the patient face to face. The total time spent in the appointment was 20 minutes and more than 50% was on counseling and review of test results     Heath Lark, MD 07/11/2016 9:23 AM

## 2016-07-12 ENCOUNTER — Telehealth: Payer: Self-pay | Admitting: Pharmacist Clinician (PhC)/ Clinical Pharmacy Specialist

## 2016-07-12 LAB — KAPPA/LAMBDA LIGHT CHAINS
IG LAMBDA FREE LIGHT CHAIN: 15.3 mg/L (ref 5.7–26.3)
Ig Kappa Free Light Chain: 23 mg/L — ABNORMAL HIGH (ref 3.3–19.4)
Kappa/Lambda FluidC Ratio: 1.5 (ref 0.26–1.65)

## 2016-07-12 NOTE — Telephone Encounter (Signed)
-----   Message from Luanna Salk, LPN sent at [id number]  9:33 AM EST ----- Patient called left message on personal voice cholesterol high,will send results to lipid clinic needs aggressive lipid management.

## 2016-07-13 LAB — MULTIPLE MYELOMA PANEL, SERUM
ALBUMIN/GLOB SERPL: 1.3 (ref 0.7–1.7)
ALPHA2 GLOB SERPL ELPH-MCNC: 0.8 g/dL (ref 0.4–1.0)
Albumin SerPl Elph-Mcnc: 3.6 g/dL (ref 2.9–4.4)
Alpha 1: 0.2 g/dL (ref 0.0–0.4)
B-Globulin SerPl Elph-Mcnc: 0.8 g/dL (ref 0.7–1.3)
Gamma Glob SerPl Elph-Mcnc: 1.1 g/dL (ref 0.4–1.8)
Globulin, Total: 3 g/dL (ref 2.2–3.9)
IGA/IMMUNOGLOBULIN A, SERUM: 177 mg/dL (ref 87–352)
IGM (IMMUNOGLOBIN M), SRM: 90 mg/dL (ref 26–217)
M Protein SerPl Elph-Mcnc: 0.4 g/dL — ABNORMAL HIGH
Total Protein: 6.6 g/dL (ref 6.0–8.5)

## 2016-07-14 ENCOUNTER — Telehealth: Payer: Self-pay | Admitting: Cardiology

## 2016-07-14 NOTE — Telephone Encounter (Signed)
Returned call to patient no answer.LMTC. 

## 2016-07-14 NOTE — Telephone Encounter (Signed)
Pt says she is having the side effects from the Plavix again.She said she did try it again.She said It is just not working,pt is going to stop the Plavix.

## 2016-07-18 NOTE — Telephone Encounter (Signed)
LMOM to determine how patient is doing on weekly crestor and daily ezetimibe

## 2016-07-21 NOTE — Telephone Encounter (Signed)
Returned call to patient no answer.LMTC. 

## 2016-07-26 ENCOUNTER — Other Ambulatory Visit: Payer: Self-pay | Admitting: Neurology

## 2016-07-28 ENCOUNTER — Other Ambulatory Visit: Payer: Self-pay | Admitting: Neurology

## 2016-07-28 ENCOUNTER — Telehealth: Payer: Self-pay | Admitting: Neurology

## 2016-07-28 DIAGNOSIS — R569 Unspecified convulsions: Secondary | ICD-10-CM

## 2016-07-28 MED ORDER — CARBAMAZEPINE ER 100 MG PO TB12: 500.0000 mg | ORAL_TABLET | Freq: Two times a day (BID) | ORAL | 1 refills | Status: DC

## 2016-07-28 NOTE — Telephone Encounter (Signed)
Returned call to patient no answer.Patient never returned call.

## 2016-07-28 NOTE — Telephone Encounter (Signed)
Pt called in stated that her tegretol from [facility] has not arrived yet, may be next week as per pt contact with them. She needs some tegretol to be prescribed to [facility] to help her until the meds come in next week. I prescribe one week worth to her to [facility]. She expressed appreciation.   Rosalin Hawking, MD PhD Stroke Neurology 07/28/2016 5:37 PM  Meds ordered this encounter  Medications  . carbamazepine (TEGRETOL-XR) 100 MG 12 hr tablet    Sig: Take 5 tablets (500 mg total) by mouth 2 (two) times daily.    Dispense:  70 tablet    Refill:  1

## 2016-08-25 ENCOUNTER — Encounter (HOSPITAL_COMMUNITY): Payer: Self-pay | Admitting: Family Medicine

## 2016-08-25 NOTE — Progress Notes (Signed)
Mailed patient letter with information about Cardiac Rehab program. MW °

## 2016-09-02 ENCOUNTER — Other Ambulatory Visit: Payer: Self-pay | Admitting: Cardiology

## 2016-09-21 NOTE — Progress Notes (Deleted)
Cardiology Office Note   Date:  09/21/2016   ID:  Rachel Clark, DOB Feb 04, 1951, MRN [id number]  PCP:  Vidal Schwalbe, MD  Cardiologist:  Dr. Lizzete Gough [name]     History of Present Illness: Rachel Clark is a 66 y.o. female with a past medical history of CAD s/p CABG in 2007, HLD, colon CA, s/p MDT PPM placed in 03/2015 for CHB who presented to the ED with c/o crushing chest pain on 11/05/2015. She was seen in the ED at that time and noted to have EKG presentation of slight ST depression in V4-V6 but this was noted to be similar to previous EKGs. She received serial enzymes which trended negative. Of note she was known to have diffuse disease throughout her RCA and was on Plavix.  At that time per Dr. Antionette Char note, patient was kept in house and started on IV heparin with plan for PCI attempt of the RCA on Tuesday 11/09/2015. On 11/10/2015 she underwent LHC. Cath showed severe native vessel disease to the LAD and RCA, s/p CABG with patency of the LIMA-LAD and chronic occlusion of the SVG-RCA. She had severe diffuse diease of the RCA, which was successfully treated with full metal jacket stenting with DES/synergy stents. Recommendations for lifelong DAPT and continued aggressive medical therapy.   She was seen in our lipid clinic in January. Discussed options of PCSK 9 inhibitor vs a clinical trial. Opted to continue Zetia and Cholestoff. Started low dose Crestor 5 mg weekly with plans to increase 5 mg/month if tolerated.   She returns today for FU.  She is doing very well from a cardiac standpoint.  No significant angina. She did develop a bad cough the day before yesterday with some chest pain on coughing or breathing. No fever or chills. She does have sinus congestion. She is intolerant to all statins. She is taking Zetia.    Wt Readings from Last 3 Encounters:  07/11/16 194 lb 11.2 oz (88.3 kg)  06/30/16 199 lb 12.8 oz (90.6 kg)  06/28/16 195 lb (88.5 kg)     Past Medical History:    Diagnosis Date  . Arthritis    "fingers" (06/29/2016)  . Chronic lower back pain   . Colon cancer ([place]) 12/04/2011   s/p Laparoscopic-assisted transverse colectomy on 12/19/2011 by Dr. Donne Hazel.  pT3 N0 M0.   . Coronary artery disease    a. remote NSTEMI with PCI to the RCA; b. S/P CABG in 2007; c. 2010 - 2 v CAD with L-LAD and S-RCA patent, small caliber Dx 70% (treated medically - no amenable to PCI), CFX free of significant disease, normal LVF. d. 10/2014 Lexi MV: EF 61%, no ischemia/infarct.  . Depression   . Dyslipidemia   . Fibromyalgia   . Gastric ulcer   . GERD (gastroesophageal reflux disease)   . 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 Headache    "weekly" (06/29/2016)  . Hyperlipidemia   . Hypothyroid   . Iron deficiency anemia 11/17/2011  . Monoclonal gammopathy of unknown significance   . Myocardial infarction 2007  . Presence of permanent cardiac pacemaker   . Syncope and collapse    pacemaker implanted  . Unstable angina [facility])     Current Outpatient Prescriptions  Medication Sig Dispense Refill  . acetaminophen (TYLENOL) 500 MG tablet Take 500-1,000 mg by mouth daily as needed for mild pain or headache.    Marland Kitchen amLODipine (NORVASC)  10 MG tablet Take 10 mg by mouth daily.     Marland Kitchen aspirin 81 MG chewable tablet Chew 1 tablet (81 mg total) by mouth daily.    . Azelastine HCl 0.15 % SOLN Place 1 spray into both nostrils daily as needed (seasonal allergies).   12  . carbamazepine (TEGRETOL XR) 100 MG 12 hr tablet TAKE 5 TABLETS TWICE A DAY 300 tablet 11  . carbamazepine (TEGRETOL-XR) 100 MG 12 hr tablet Take 5 tablets (500 mg total) by mouth 2 (two) times daily. 70 tablet 1  . carvedilol (COREG) 12.5 MG tablet Take 6.25 mg by mouth 2 (two) times daily with a meal.    . carvedilol (COREG) 12.5 MG tablet TAKE 1 TABLET (12.5 MG TOTAL) BY MOUTH 2 (TWO) TIMES DAILY WITH A MEAL. 60 tablet 5  . clopidogrel (PLAVIX)  75 MG tablet Take 1 tablet (75 mg total) by mouth daily with breakfast. 30 tablet 12  . DULoxetine (CYMBALTA) 60 MG capsule Take 60 mg by mouth daily.     Marland Kitchen estradiol (VIVELLE-DOT) 0.05 MG/24HR patch Place 1 patch onto the skin 2 (two) times a week. Sunday, Wednesday    . ethosuximide (ZARONTIN) 250 MG capsule Take 2 capsule (500mg ) in the morning, , and 2 capsules (500mg ) at night 120 capsule 11  . ezetimibe (ZETIA) 10 MG tablet Take 1 tablet by mouth once daily.  90 tablet 3  . isosorbide mononitrate (IMDUR) 30 MG 24 hr tablet Take 30 mg by mouth daily.    Marland Kitchen levothyroxine (SYNTHROID, LEVOTHROID) 50 MCG tablet Take 50 mcg by mouth daily.     . nitroGLYCERIN (NITROSTAT) 0.4 MG SL tablet Place 1 tablet (0.4 mg total) under the tongue every 5 (five) minutes as needed. For chest pain. (Patient taking differently: Place 0.4 mg under the tongue every 5 (five) minutes as needed for chest pain. ) 25 tablet 6  . rosuvastatin (CRESTOR) 5 MG tablet Take 1 tablet by mouth up to 3 times weekly as directed 12 tablet 3  . tiZANidine (ZANAFLEX) 2 MG tablet Take 2-4 mg by mouth at bedtime.   2   No current facility-administered medications for this visit.      Allergies:   Aspirin; Erythromycin; Lisinopril; Niacin and related; Statins; Sulfa drugs cross reactors; Tetracyclines & related; and Penicillins   Social History:  The patient  reports that she quit smoking about 35 years ago. Her smoking use included Cigarettes. She has a 6.00 pack-year smoking history. She has never used smokeless tobacco. She reports that she drinks about 0.6 oz of alcohol per week . She reports that she does not use drugs.   Family History:  The patient's family history includes Cancer in her mother; Heart attack in her father; Heart attack (age of onset: 28) in her brother; Heart disease in her father.    ROS:  Please see the history of present illness.       All other systems reviewed and negative.    PHYSICAL EXAM: VS:  There  were no vitals taken for this visit. Well nourished, well developed, in no acute distress  HEENT: normal  Neck: no JVD  Carotids:  No bruits bilat Cardiac:  normal S1, S2;  RRR;  no murmur   Lungs:   Very coarse bilateral rhonchi and wheezing.  Abd: soft, nontender, no hepatomegaly  Ext:  no edema  Skin: warm and dry  Neuro:  CNs 2-12 intact, no focal abnormalities noted  EKG:    Lab  Results  Component Value Date   WBC 6.5 07/11/2016   HGB 13.3 07/11/2016   HCT 39.1 07/11/2016   PLT 279 07/11/2016   GLUCOSE 115 07/11/2016   CHOL 249 (H) 07/07/2016   TRIG 134 07/07/2016   HDL 85 07/07/2016   LDLCALC 137 (H) 07/07/2016   ALT 14 07/11/2016   AST 13 07/11/2016   NA 137 07/11/2016   K 4.4 07/11/2016   CL 104 06/30/2016   CREATININE 0.8 07/11/2016   BUN 15.6 07/11/2016   CO2 27 07/11/2016   TSH 0.496 03/24/2015   INR 1.0 06/28/2016   HGBA1C 5.9 (H) 03/24/2015      ASSESSMENT AND PLAN:   1.  CAD s/p CABG:  Admitted in May 2017 with unstable angina. SVG to RCA occluded with severe native RCA disease (essentially CTO). S/p extensive stenting of the native RCA with DES. No angina.  Continue DAPT indefinitely.   She is intol to statins.    2.  Hyperlipidemia:  She is intol to statins.  07/07/2016: HDL 85; LDL Cholesterol 137 07/11/2016: ALT 14    Continue Zetia. Does not want to try a PCSK9 inhibitor.  3. Hypertension- controlled.   Disposition:   FU with me in 6 months.   Signed, Shamila Lerch [name] MD, [facility]    09/21/2016 2:40 PM    [facility] [place], [place], [place]  [address] Phone: [phone]; Fax: [phone]

## 2016-09-22 ENCOUNTER — Ambulatory Visit: Payer: BLUE CROSS/BLUE SHIELD | Admitting: Cardiology

## 2016-09-22 IMAGING — DX DG CHEST 2V
2 series · 2 of 2 positions shown · non-contrast
Comparison: 03/26/2015

CLINICAL DATA: Chest pain.  Coronary artery disease.

EXAM:
CHEST  2 VIEW

[chest pa]
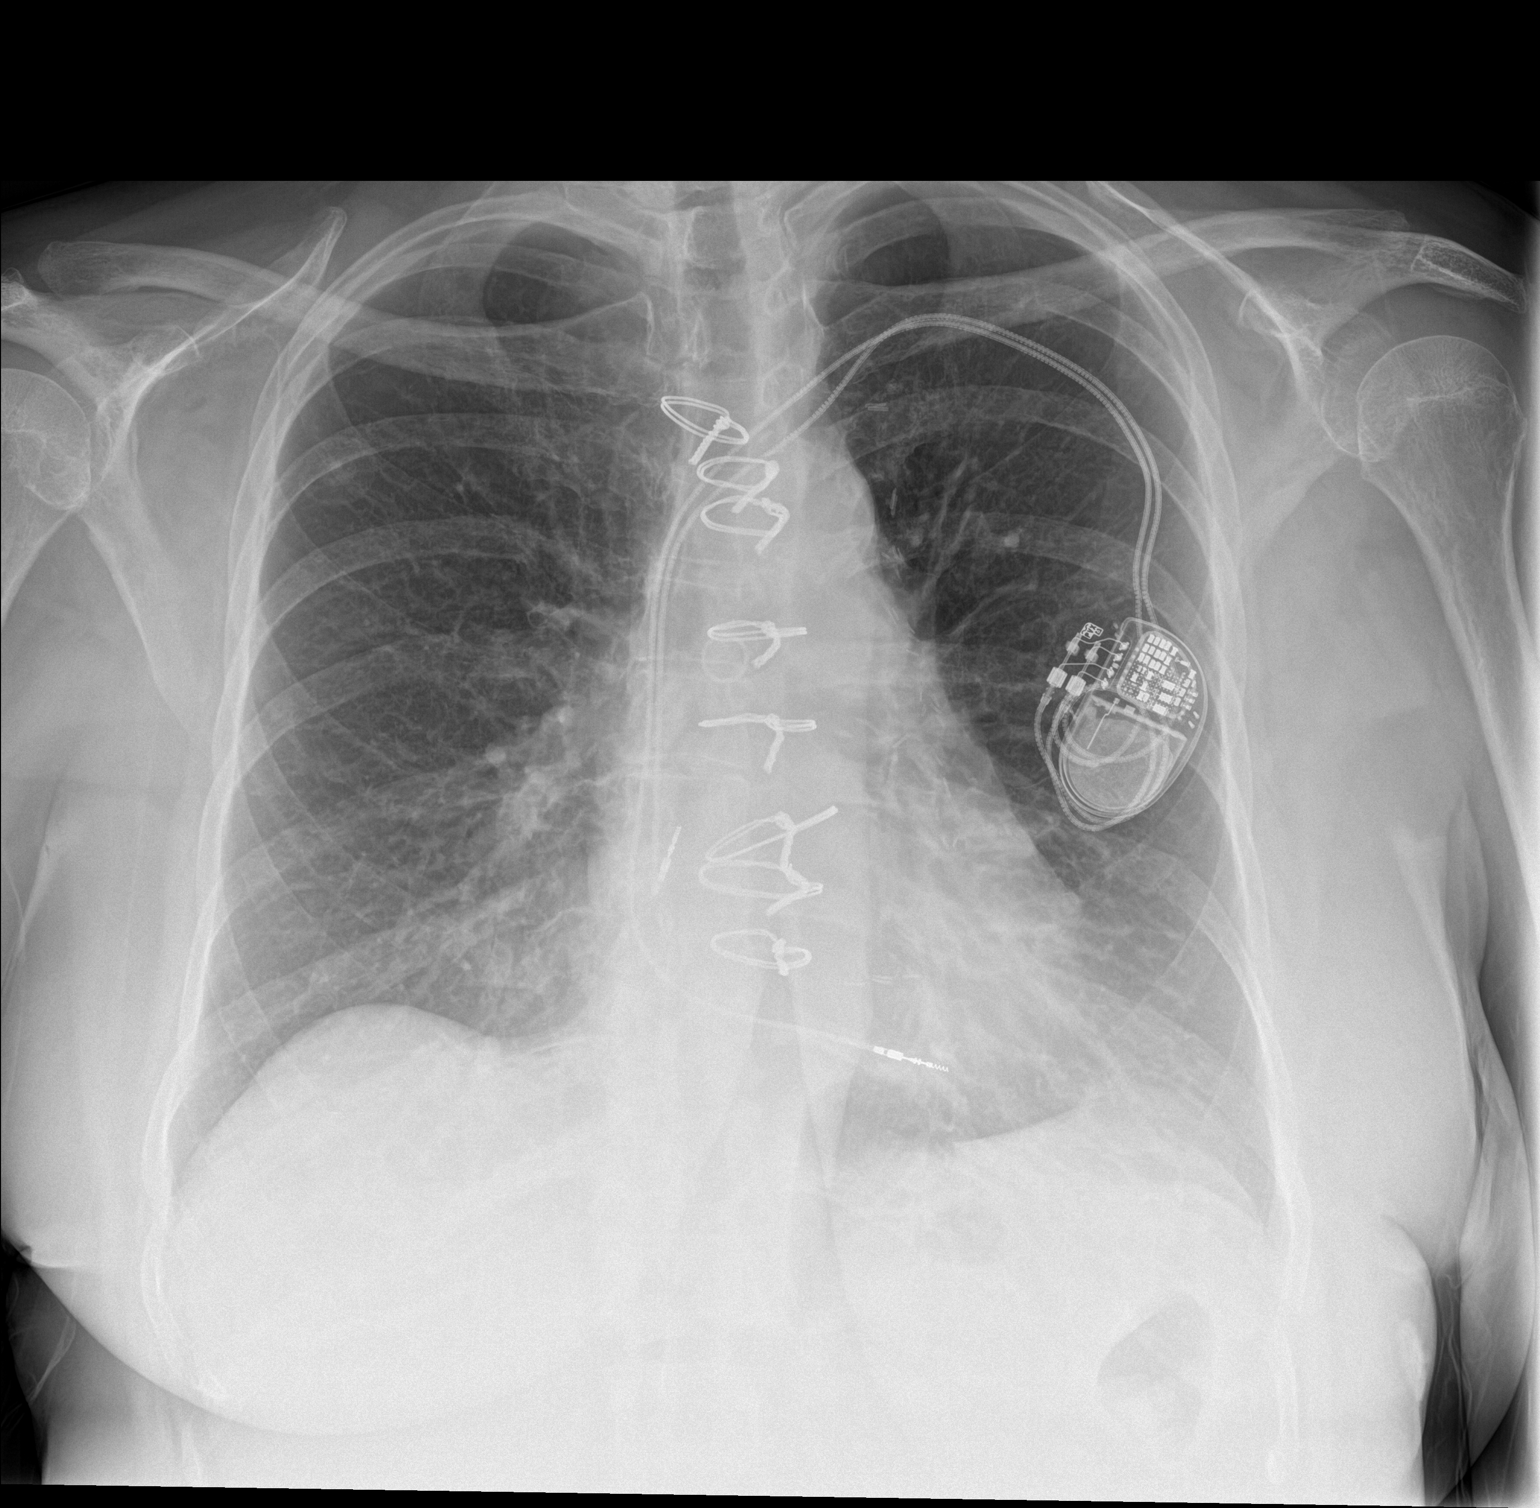

[chest lat]
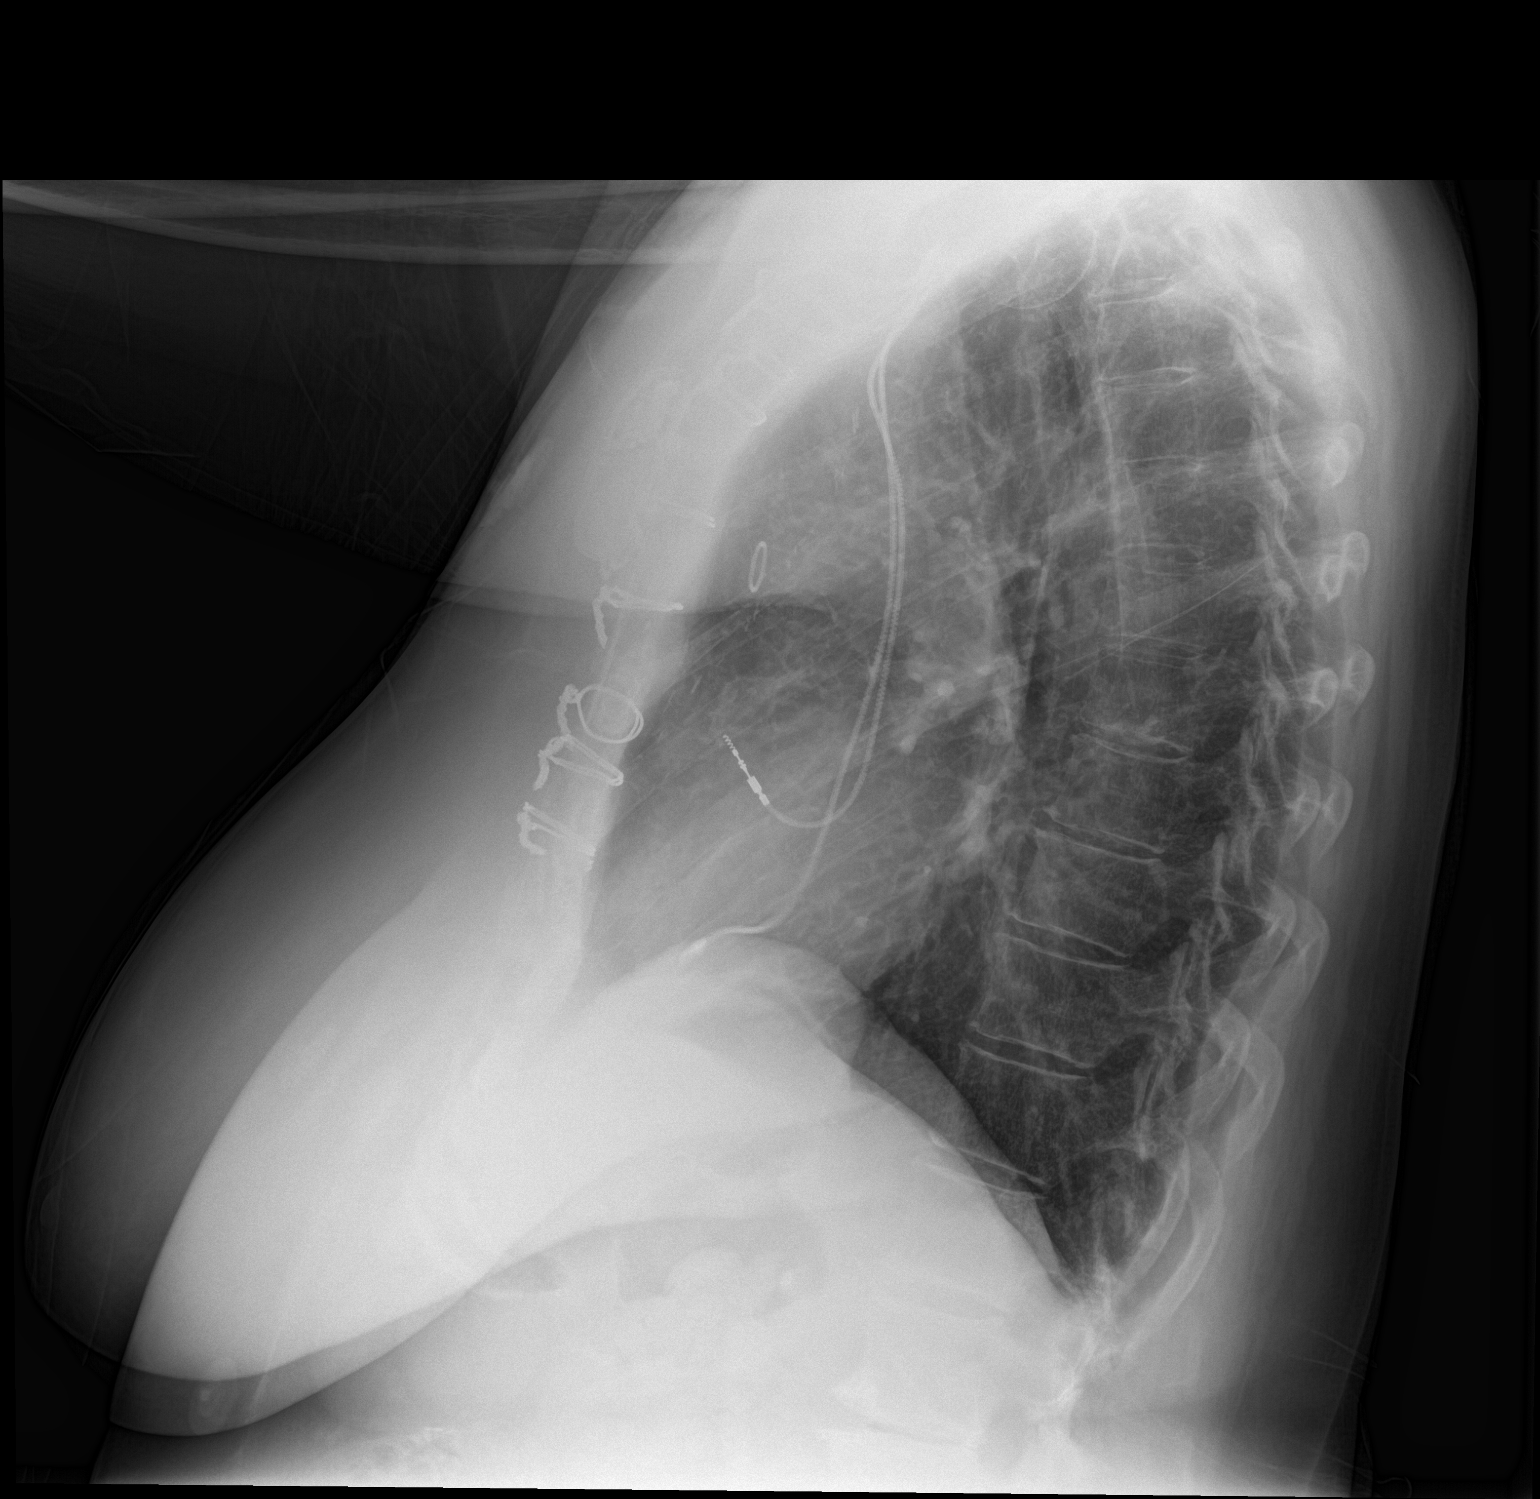

[2 of 2 positions shown; findings below may reference images not displayed]

FINDINGS: The heart size and mediastinal contours are within normal limits.
Both lungs are clear. No evidence of pneumothorax or hemothorax.
Dual lead transvenous pacemaker remains in appropriate position.
Prior CABG again noted.
IMPRESSION: No active cardiopulmonary disease.

## 2016-11-02 NOTE — Progress Notes (Signed)
Cardiology Office Note   Date:  11/03/2016   ID:  Rachel Clark, DOB [birth year], MRN [id number]  PCP:  Vidal Schwalbe, MD  Cardiologist:  Dr. Katherine Syme [name]     History of Present Illness: Rachel Clark is a 66 y.o. female with a past medical history of CAD s/p CABG in 2007, HLD, colon CA, s/p MDT PPM placed in 03/2015 for CHB who presented to the ED with c/o crushing chest pain on 11/05/2015. She was seen in the ED at that time and noted to have EKG presentation of slight ST depression in V4-V6 but this was noted to be similar to previous EKGs. She received serial enzymes which trended negative. Of note she was known to have diffuse disease throughout her RCA and was on Plavix.  At that time per Dr. Antionette Char note, patient was kept in house and started on IV heparin with plan for PCI attempt of the RCA on Tuesday 11/09/2015. On 11/10/2015 she underwent LHC. Cath showed severe native vessel disease to the LAD and RCA, s/p CABG with patency of the LIMA-LAD and chronic occlusion of the SVG-RCA. She had severe diffuse diease of the RCA, which was successfully treated with full metal jacket stenting with DES/synergy stents. Recommendations for lifelong DAPT and continued aggressive medical therapy.   She returns today for FU.  She is doing very well from a cardiac standpoint.  No  angina. She was treated recently for PNA.  She has a history of intolerance to statins. She is taking Zetia. She is now up to 15 mg Crestor weekly and tolerating well. She was seen in the lipid clinic in January.   Studies: Echo: Jan 2017:Study Conclusions  - Left ventricle: The cavity size was normal. There was mild   concentric hypertrophy. Systolic function was normal. The   estimated ejection fraction was in the range of 60% to 65%. Wall   motion was normal; there were no regional wall motion   abnormalities. Left ventricular diastolic function parameters   were normal. - Mitral valve: There was mild  regurgitation. - Atrial septum: No defect or patent foramen ovale was identified   bu color flow Doppler. - Tricuspid valve: There was trivial regurgitation. - Inferior vena cava: The vessel was normal in size. The   respirophasic diameter changes were in the normal range (>= 50%),   consistent with normal central venous pressure. - Global longitudinal strain -18.6%.  Procedures 11/09/15  Coronary Stent Intervention  Left Heart Cath and Coronary Angiography  Conclusion    Ramus lesion, 30% stenosed.  Ost LAD to Prox LAD lesion, 95% stenosed.  Mid RCA-1 lesion, 95% stenosed.  Mid RCA-2 lesion, 90% stenosed.  SVG .  Origin lesion, 100% stenosed.  Dist LAD-1 lesion, 70% stenosed.  Dist LAD-2 lesion, 50% stenosed.  Ost RCA to Prox RCA lesion, 90% stenosed. Post intervention, there is a 0% residual stenosis. The lesion was previously treated with a stent (unknown type).   1. Severe native vessel disease of the LAD and RCA 2. S/P CABG with continued patency of the LIMA-LAD and chronic occlusion of the SVG-RCA 3. Severe diffuse disease of the RCA, treated successfully with full-metal jacket stenting (drug-eluting)  Lifelong DAPT as tolerated. Continue aggressive medical therapy. Anticipate discharge tomorrow as long as clinically stable.         Recent Labs: 07/07/2016: LDL Cholesterol 137 07/11/2016: ALT 14; BUN 15.6; Creatinine 0.8; HGB 13.3; Potassium 4.4; Sodium 137   CrCl cannot be calculated (  Patient's most recent lab result is older than the maximum 21 days allowed.).    Recent Radiology: No results found.    Wt Readings from Last 3 Encounters:  11/03/16 189 lb 12.8 oz (86.1 kg)  07/11/16 194 lb 11.2 oz (88.3 kg)  06/30/16 199 lb 12.8 oz (90.6 kg)     Past Medical History:  Diagnosis Date  . Arthritis    "fingers" (06/29/2016)  . Chronic lower back pain   . Colon cancer ([place]) 12/04/2011   s/p Laparoscopic-assisted transverse colectomy on 12/19/2011 by Dr.  Donne Hazel.  pT3 N0 M0.   . Coronary artery disease    a. remote NSTEMI with PCI to the RCA; b. S/P CABG in 2007; c. 2010 - 2 v CAD with L-LAD and S-RCA patent, small caliber Dx 70% (treated medically - no amenable to PCI), CFX free of significant disease, normal LVF. d. 10/2014 Lexi MV: EF 61%, no ischemia/infarct.  . Depression   . Dyslipidemia   . Fibromyalgia   . Gastric ulcer   . GERD (gastroesophageal reflux disease)   . 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 Headache    "weekly" (06/29/2016)  . Hyperlipidemia   . Hypothyroid   . Iron deficiency anemia 11/17/2011  . Monoclonal gammopathy of unknown significance   . Myocardial infarction ([place]) 2007  . Presence of permanent cardiac pacemaker   . Syncope and collapse    pacemaker implanted  . Unstable angina [facility])     Current Outpatient Prescriptions  Medication Sig Dispense Refill  . acetaminophen (TYLENOL) 500 MG tablet Take 500-1,000 mg by mouth daily as needed for mild pain or headache.    Marland Kitchen amLODipine (NORVASC) 10 MG tablet Take 10 mg by mouth daily.     Marland Kitchen aspirin 81 MG chewable tablet Chew 1 tablet (81 mg total) by mouth daily.    . Azelastine HCl 0.15 % SOLN Place 1 spray into both nostrils daily as needed (seasonal allergies).   12  . carbamazepine (TEGRETOL XR) 100 MG 12 hr tablet TAKE 5 TABLETS TWICE A DAY 300 tablet 11  . carvedilol (COREG) 12.5 MG tablet TAKE 1 TABLET (12.5 MG TOTAL) BY MOUTH 2 (TWO) TIMES DAILY WITH A MEAL. 60 tablet 5  . DULoxetine (CYMBALTA) 60 MG capsule Take 60 mg by mouth daily.     Marland Kitchen estradiol (VIVELLE-DOT) 0.05 MG/24HR patch Place 1 patch onto the skin 2 (two) times a week. Sunday, Wednesday    . ethosuximide (ZARONTIN) 250 MG capsule Take 2 capsule (500mg ) in the morning, , and 2 capsules (500mg ) at night 120 capsule 11  . ezetimibe (ZETIA) 10 MG tablet Take 1 tablet by mouth once daily.  90 tablet 3  . isosorbide mononitrate  (IMDUR) 30 MG 24 hr tablet Take 30 mg by mouth daily.    Marland Kitchen levothyroxine (SYNTHROID, LEVOTHROID) 50 MCG tablet Take 50 mcg by mouth daily.     . nitroGLYCERIN (NITROSTAT) 0.4 MG SL tablet Place 1 tablet (0.4 mg total) under the tongue every 5 (five) minutes as needed. For chest pain. (Patient taking differently: Place 0.4 mg under the tongue every 5 (five) minutes as needed for chest pain. ) 25 tablet 6  . rosuvastatin (CRESTOR) 5 MG tablet Take 1 tablet by mouth up to 3 times weekly as directed 12 tablet 3  . tiZANidine (ZANAFLEX) 2 MG tablet Take 2-4 mg by mouth at bedtime.   2  No current facility-administered medications for this visit.      Allergies:   Aspirin; Erythromycin; Lisinopril; Niacin and related; Statins; Sulfa drugs cross reactors; Tetracyclines & related; and Penicillins   Social History:  The patient  reports that she quit smoking about 35 years ago. Her smoking use included Cigarettes. She has a 6.00 pack-year smoking history. She has never used smokeless tobacco. She reports that she drinks about 0.6 oz of alcohol per week . She reports that she does not use drugs.   Family History:  The patient's family history includes Cancer in her mother; Heart attack in her father; Heart attack (age of onset: 4) in her brother; Heart disease in her father.    ROS:  Please see the history of present illness.       All other systems reviewed and negative.    PHYSICAL EXAM: VS:  BP 122/70   Pulse 82   Ht 5\' 9"  (1.753 m)   Wt 189 lb 12.8 oz (86.1 kg)   BMI 28.03 kg/m  weight is down 5 lbs. Well nourished, well developed, in no acute distress  HEENT: normal  Neck: no JVD  Carotids:  No bruits bilat Cardiac:  normal S1, S2;  RRR;  no murmur   Lungs:   Clear Abd: soft, nontender, no hepatomegaly  Ext:  no edema  Skin: warm and dry  Neuro:  CNs 2-12 intact, no focal abnormalities noted  EKG:    Lab Results  Component Value Date   WBC 6.5 07/11/2016   HGB 13.3 07/11/2016     HCT 39.1 07/11/2016   PLT 279 07/11/2016   GLUCOSE 115 07/11/2016   CHOL 249 (H) 07/07/2016   TRIG 134 07/07/2016   HDL 85 07/07/2016   LDLCALC 137 (H) 07/07/2016   ALT 14 07/11/2016   AST 13 07/11/2016   NA 137 07/11/2016   K 4.4 07/11/2016   CL 104 06/30/2016   CREATININE 0.8 07/11/2016   BUN 15.6 07/11/2016   CO2 27 07/11/2016   TSH 0.496 03/24/2015   INR 1.0 06/28/2016   HGBA1C 5.9 (H) 03/24/2015      ASSESSMENT AND PLAN:   1.  CAD s/p CABG:  Admitted in May 2017 with unstable angina. SVG to RCA occluded with severe native RCA disease (essentially CTO). S/p extensive stenting of the native RCA with DES. No angina.  Continue DAPT indefinitely.    2.  Hyperlipidemia:  She is currently tolerating low dose Crestor and Zetia.  Follow up lab work in 6 months. 3. Hypertension- controlled.   Disposition:   FU with me in 6 months.   Signed, Jamerica Snavely [name] MD, [facility]    11/03/2016 8:24 AM    [facility] [place], [place], [place]  [address] Phone: [phone]; Fax: [phone]

## 2016-11-03 ENCOUNTER — Ambulatory Visit (INDEPENDENT_AMBULATORY_CARE_PROVIDER_SITE_OTHER): Payer: BLUE CROSS/BLUE SHIELD | Admitting: Cardiology

## 2016-11-03 ENCOUNTER — Encounter: Payer: Self-pay | Admitting: Cardiology

## 2016-11-03 VITALS — BP 122/70 | HR 82 | Ht 69.0 in | Wt 189.8 lb

## 2016-11-03 DIAGNOSIS — I25810 Atherosclerosis of coronary artery bypass graft(s) without angina pectoris: Secondary | ICD-10-CM

## 2016-11-03 DIAGNOSIS — E785 Hyperlipidemia, unspecified: Secondary | ICD-10-CM

## 2016-11-03 DIAGNOSIS — I2510 Atherosclerotic heart disease of native coronary artery without angina pectoris: Secondary | ICD-10-CM

## 2016-11-03 DIAGNOSIS — I119 Hypertensive heart disease without heart failure: Secondary | ICD-10-CM

## 2016-11-03 NOTE — Patient Instructions (Signed)
Continue your current therapy  I will see you in 6 months.   

## 2016-11-08 ENCOUNTER — Telehealth: Payer: Self-pay | Admitting: Neurology

## 2016-11-08 NOTE — Telephone Encounter (Signed)
Patients husband called office in reference to patient having confusion, forgetting things, ask the same questions over a couple of times a day.  Patients husband would like to speak with Dr. Jannifer Franklin in reference to concerns please call between the hours of 9am-1:30pm.  Please call

## 2016-11-08 NOTE — Telephone Encounter (Signed)
Called and spoke with husband. Scheduled appt for 11/13/16 at 11:00am, check in 10:30am. He verbalized understanding and appreciation for call.

## 2016-11-08 NOTE — Telephone Encounter (Signed)
Called the husband. The patient over the last 3 months is had some increasing problems with memory and confusion, she has been leaving the burners on the stove, forgetting to do things. Her sister had Alzheimer disease beginning in her late 80s.  We will try to get the patient in for a revisit to evaluate the memory issues.

## 2016-11-13 ENCOUNTER — Encounter: Payer: Self-pay | Admitting: Neurology

## 2016-11-13 ENCOUNTER — Ambulatory Visit (INDEPENDENT_AMBULATORY_CARE_PROVIDER_SITE_OTHER): Payer: BLUE CROSS/BLUE SHIELD | Admitting: Neurology

## 2016-11-13 VITALS — BP 121/75 | HR 76 | Ht 69.0 in | Wt 189.0 lb

## 2016-11-13 DIAGNOSIS — E538 Deficiency of other specified B group vitamins: Secondary | ICD-10-CM

## 2016-11-13 DIAGNOSIS — Z5181 Encounter for therapeutic drug level monitoring: Secondary | ICD-10-CM | POA: Diagnosis not present

## 2016-11-13 DIAGNOSIS — R569 Unspecified convulsions: Secondary | ICD-10-CM

## 2016-11-13 DIAGNOSIS — R413 Other amnesia: Secondary | ICD-10-CM | POA: Diagnosis not present

## 2016-11-13 DIAGNOSIS — I2510 Atherosclerotic heart disease of native coronary artery without angina pectoris: Secondary | ICD-10-CM

## 2016-11-13 HISTORY — DX: Other amnesia: R41.3

## 2016-11-13 NOTE — Patient Instructions (Signed)
   We will get blood work today and CT of the brain. We will order neuropsychological testing.

## 2016-11-13 NOTE — Progress Notes (Signed)
Reason for visit: Memory disturbance  Rachel Clark is an 66 y.o. female  History of present illness:  Rachel Clark is a 66 year old right-handed white female with a history of seizures, she has not had a seizure since she was around 67 years old. She remains on ethosuximide and carbamazepine. She has not wished to come off of her medications. Her son also has seizures, he has recently been diagnosed with pseudoseizures, and this has put a lot of stress on the patient. The patient has a history of fibromyalgia and chronic fatigue syndrome, her chronic fatigue has worsened over time. Within the last 3 or 4 months she has been noted to have some increased problems with cognitive dysfunction. She has had some short-term memory issues, she is repeating herself frequently. The patient denies any word finding problems or difficulty remembering names for people. The patient still works and she functions quite well at work even though her work requires a high level of cognitive functioning. The patient snores at night, she has been evaluated for sleep apnea several years ago and was told she had some mild issues with this. The patient has had increased problems with daytime drowsiness, she will have to come home from work and take a nap. The patient denies any numbness or weakness of the extremities. She denies any significant problems with headache. She denies issues controlling the bowels or the bladder. The patient's sister had early onset Alzheimer's disease, her paternal grandfather also had Alzheimer's disease. The patient does have some underlying problems with depression and anxiety and the stress that is going on with her son. The husband has noted some irritability on the part the patient as well. She comes in to this office for an evaluation.  Past Medical History:  Diagnosis Date  . Arthritis    "fingers" (06/29/2016)  . Chronic lower back pain   . Colon cancer ([place]) 12/04/2011   s/p  Laparoscopic-assisted transverse colectomy on 12/19/2011 by Dr. Donne Hazel.  pT3 N0 M0.   . Coronary artery disease    a. remote NSTEMI with PCI to the RCA; b. S/P CABG in 2007; c. 2010 - 2 v CAD with L-LAD and S-RCA patent, small caliber Dx 70% (treated medically - no amenable to PCI), CFX free of significant disease, normal LVF. d. 10/2014 Lexi MV: EF 61%, no ischemia/infarct.  . Depression   . Dyslipidemia   . Fibromyalgia   . Gastric ulcer   . GERD (gastroesophageal reflux disease)   . Grand mal epilepsy, controlled ([place]) 12/06/2011   last seizure was in 1972 ;takes Tegretol (06/29/2016)   . Headache    "weekly" (06/29/2016)  . Hyperlipidemia   . Hypothyroid   . Iron deficiency anemia 11/17/2011  . Monoclonal gammopathy of unknown significance   . Myocardial infarction ([place]) 2007  . Presence of permanent cardiac pacemaker   . Syncope and collapse    pacemaker implanted  . Unstable angina [facility])     Past Surgical History:  Procedure Laterality Date  . ANTERIOR CERVICAL DECOMP/DISCECTOMY FUSION  1980's  . BACK SURGERY    . CARDIAC CATHETERIZATION  05/20/2009   obstructive native vessel disease in LAD, RCA, and first diagonal, patent vein graft to distal RCA and LIMA to LAD,normal. ef 60%  . CARDIAC CATHETERIZATION N/A 03/24/2015   Procedure: Left Heart Cath and Cors/Grafts Angiography;  Surgeon: Wellington Hampshire, MD;  Location: [place] [facility];  Service: Cardiovascular;  Laterality: N/A;  . CARDIAC CATHETERIZATION N/A 07/28/2015  Procedure: Left Heart Cath and Coronary Angiography;  Surgeon: Troy Sine, MD;  Location: [place] [facility];  Service: Cardiovascular;  Laterality: N/A;  . CARDIAC CATHETERIZATION N/A 11/09/2015   Procedure: Coronary Stent Intervention;  Surgeon: Sherren Mocha, MD;  Location: [place] [facility];  Service: Cardiovascular;  Laterality: N/A;  . CARDIAC CATHETERIZATION N/A 11/09/2015   Procedure: Left Heart Cath and Coronary Angiography;  Surgeon: Sherren Mocha, MD;  Location: [place] [facility];  Service: Cardiovascular;  Laterality: N/A;  . CARDIAC CATHETERIZATION N/A 06/29/2016   Procedure: Left Heart Cath and Coronary Angiography;  Surgeon: Nelva Bush, MD;  Location: [place] [facility];  Service: Cardiovascular;  Laterality: N/A;  . CARDIAC CATHETERIZATION N/A 06/29/2016   Procedure: Intravascular Pressure Wire/FFR Study;  Surgeon: Nelva Bush, MD;  Location: [place] [facility];  Service: Cardiovascular;  Laterality: N/A;  . CARDIAC CATHETERIZATION N/A 06/29/2016   Procedure: Coronary Balloon Angioplasty;  Surgeon: Nelva Bush, MD;  Location: [place] [facility];  Service: Cardiovascular;  Laterality: N/A;  . COLON RESECTION  12/19/2011   Procedure: COLON RESECTION LAPAROSCOPIC;  Surgeon: Rolm Bookbinder, MD;  Location: [place];  Service: General;  Laterality: N/A;  laparoscopic hand assisted partial colon resection  . COLON SURGERY    . COLONOSCOPY    . CORONARY ANGIOPLASTY WITH STENT PLACEMENT  ~ 2007   1 stent  . CORONARY ARTERY BYPASS GRAFT  2007   "CABG X2"  . DILATION AND CURETTAGE OF UTERUS  1973  . EP IMPLANTABLE DEVICE N/A 03/25/2015   MDT Advisa DR pacemaker implanted by Dr Rayann Heman for transient complete heart block and syncope  . INSERT / REPLACE / REMOVE PACEMAKER    . PORT-A-CATH REMOVAL N/A 06/12/2013   Procedure: REMOVAL PORT-A-CATH;  Surgeon: Rolm Bookbinder, MD;  Location: [place];  Service: General;  Laterality: N/A;  . PORTACATH PLACEMENT  06/04/2012   Procedure: INSERTION PORT-A-CATH;  Surgeon: Rolm Bookbinder, MD;  Location: [place];  Service: General;  Laterality: N/A;  Insertion of port-a-cath   . POSTERIOR LAMINECTOMY / DECOMPRESSION CERVICAL SPINE  1990s  . VAGINAL HYSTERECTOMY      Family History  Problem Relation Age of Onset  . Heart attack Father   . Heart disease Father   . Heart attack Brother 70  . Cancer Mother        lung  . Stroke Neg Hx     Social history:   reports that she quit smoking about 35 years ago. Her smoking use included Cigarettes. She has a 6.00 pack-year smoking history. She has never used smokeless tobacco. She reports that she drinks about 0.6 oz of alcohol per week . She reports that she does not use drugs.    Allergies  Allergen Reactions  . Aspirin Other (See Comments)    GI upset  . Erythromycin Nausea Only    Gi upset  . Lisinopril Cough  . Niacin And Related     Pt does not recall reactions   . Statins     Muscle pain  . Sulfa Drugs Cross Reactors Swelling  . Tetracyclines & Related Nausea Only  . Penicillins Nausea Only and Rash    Has patient had a PCN reaction causing immediate rash, facial/tongue/throat swelling, SOB or lightheadedness with hypotension: Yes Has patient had a PCN reaction causing severe rash involving mucus membranes or skin necrosis: No Has patient had a PCN reaction that required hospitalization No Has patient had a PCN reaction occurring within  the last 10 years: No If all of the above answers are "NO", then may proceed with Cephalosporin use.     Medications:  Prior to Admission medications   Medication Sig Start Date End Date Taking? Authorizing Provider  acetaminophen (TYLENOL) 500 MG tablet Take 500-1,000 mg by mouth daily as needed for mild pain or headache.   Yes [provider]  amLODipine (NORVASC) 10 MG tablet Take 10 mg by mouth daily.    Yes [provider]  aspirin 81 MG chewable tablet Chew 1 tablet (81 mg total) by mouth daily. 06/30/16  Yes Arbutus Leas, NP  Azelastine HCl 0.15 % SOLN Place 1 spray into both nostrils daily as needed (seasonal allergies).  12/22/14  Yes [provider]  carbamazepine (TEGRETOL XR) 100 MG 12 hr tablet TAKE 5 TABLETS TWICE A DAY 04/27/16  Yes Millikan, Megan, NP  carvedilol (COREG) 12.5 MG tablet TAKE 1 TABLET (12.5 MG TOTAL) BY MOUTH 2 (TWO) TIMES DAILY WITH A MEAL. 09/04/16  Yes [name], Peter M, MD  DULoxetine (CYMBALTA)  60 MG capsule Take 60 mg by mouth daily.    Yes [provider]  estradiol (VIVELLE-DOT) 0.05 MG/24HR patch Place 1 patch onto the skin 2 (two) times a week. Sunday, Wednesday   Yes [provider]  ethosuximide (ZARONTIN) 250 MG capsule Take 2 capsule (500mg ) in the morning, , and 2 capsules (500mg ) at night 04/27/16  Yes Millikan, Jinny Blossom, NP  ezetimibe (ZETIA) 10 MG tablet Take 1 tablet by mouth once daily.  01/10/16  Yes [name], Peter M, MD  isosorbide mononitrate (IMDUR) 30 MG 24 hr tablet Take 30 mg by mouth daily.   Yes [provider]  levothyroxine (SYNTHROID, LEVOTHROID) 50 MCG tablet Take 50 mcg by mouth daily.    Yes [provider]  nitroGLYCERIN (NITROSTAT) 0.4 MG SL tablet Place 1 tablet (0.4 mg total) under the tongue every 5 (five) minutes as needed. For chest pain. Patient taking differently: Place 0.4 mg under the tongue every 5 (five) minutes as needed for chest pain.  04/05/15  Yes Burtis Junes, NP  rosuvastatin (CRESTOR) 5 MG tablet Take 1 tablet by mouth up to 3 times weekly as directed 07/06/16  Yes [name], Peter M, MD  tiZANidine (ZANAFLEX) 2 MG tablet Take 2-4 mg by mouth at bedtime.  01/06/15  Yes [provider]    ROS:  Out of a complete 14 system review of symptoms, the patient complains only of the following symptoms, and all other reviewed systems are negative.  Memory loss, dizziness Agitation, confusion, depression  Blood pressure 121/75, pulse 76, height 5\' 9"  (1.753 m), weight 189 lb (85.7 kg).  Physical Exam  General: The patient is alert and cooperative at the time of the examination.  Respiratory: Lung fields are clear  Cardiovascular: Regular rate and rhythm, no murmurs or rubs are noted.  Neck: Supple, no carotid bruits are noted.  Eyes: Pupils are equal round, and reactive to light. Discs are flat bilaterally.  Skin: No significant peripheral edema is noted.   Neurologic Exam  Mental status: The  patient is alert and oriented x 3 at the time of the examination. The patient has apparent normal recent and remote memory, with an apparently normal attention span and concentration ability. Mini-Mental Status Examination done today shows a total score 26/30.   Cranial nerves: Facial symmetry is present. Speech is normal, no aphasia or dysarthria is noted. Extraocular movements are full. Visual fields are full.  Motor: The patient has good strength in all 4 extremities.  Sensory examination: Soft touch sensation is symmetric on the face, arms, and legs. The patient appears to have decreased position sense in both feet, vibration sensation is decreased on the right foot relative to the left. There may be a stocking pattern pinprick sensory deficit one half way up the legs bilaterally.  Coordination: The patient has good finger-nose-finger and heel-to-shin bilaterally.  Gait and station: The patient has a normal gait. Tandem gait is normal. Romberg is negative. No drift is seen.  Reflexes: Deep tendon reflexes are symmetric. Toes are downgoing bilaterally.   Assessment/Plan:  1. History of seizures, well controlled  2. Reported memory disturbance  3. History of fibromyalgia, chronic fatigue  The patient is under a lot of stress recently with her son. The patient has a history of fibromyalgia and chronic fatigue syndrome which may be associated with cognitive changes, particularly when under stress. The patient may also be at risk for sleep apnea. The patient likely has some underlying depression. The patient will be set up for a CT scan of the brain, she has a pacemaker in place. The patient will have blood work done today. She will be set up for neuropsychological evaluation. We may consider a sleep study in the future depending upon the results of the above. She appears to have some decreased position sense in both feet, a vitamin B12 level will be checked today. She will follow-up in 6  months.   Jill Alexanders MD 11/13/2016 11:18 AM  [facility] [address] [place], [place] [address]  Phone [phone] Fax [phone]

## 2016-11-14 LAB — CBC WITH DIFFERENTIAL/PLATELET
BASOS ABS: 0 10*3/uL (ref 0.0–0.2)
BASOS: 0 %
EOS (ABSOLUTE): 0.3 10*3/uL (ref 0.0–0.4)
Eos: 4 %
HEMOGLOBIN: 13.9 g/dL (ref 11.1–15.9)
Hematocrit: 40.8 % (ref 34.0–46.6)
IMMATURE GRANS (ABS): 0 10*3/uL (ref 0.0–0.1)
Immature Granulocytes: 0 %
LYMPHS: 30 %
Lymphocytes Absolute: 2.4 10*3/uL (ref 0.7–3.1)
MCH: 29.3 pg (ref 26.6–33.0)
MCHC: 34.1 g/dL (ref 31.5–35.7)
MCV: 86 fL (ref 79–97)
MONOCYTES: 8 %
Monocytes Absolute: 0.6 10*3/uL (ref 0.1–0.9)
NEUTROS ABS: 4.6 10*3/uL (ref 1.4–7.0)
Neutrophils: 58 %
Platelets: 319 10*3/uL (ref 150–379)
RBC: 4.75 x10E6/uL (ref 3.77–5.28)
RDW: 13.7 % (ref 12.3–15.4)
WBC: 8 10*3/uL (ref 3.4–10.8)

## 2016-11-14 LAB — SEDIMENTATION RATE: Sed Rate: 3 mm/hr (ref 0–40)

## 2016-11-14 LAB — RPR: RPR: NONREACTIVE

## 2016-11-14 LAB — B. BURGDORFI ANTIBODIES: Lyme IgG/IgM Ab: <0.91 {ISR} (ref 0.00–0.90)

## 2016-11-14 LAB — CARBAMAZEPINE LEVEL, TOTAL: CARBAMAZEPINE LVL: 6.5 ug/mL (ref 4.0–12.0)

## 2016-11-14 LAB — HIV ANTIBODY (ROUTINE TESTING W REFLEX): HIV Screen 4th Generation wRfx: NONREACTIVE

## 2016-11-14 LAB — VITAMIN B12: VITAMIN B 12: 290 pg/mL (ref 232–1245)

## 2016-11-14 LAB — ANA W/REFLEX: Anti Nuclear Antibody(ANA): NEGATIVE

## 2016-11-15 ENCOUNTER — Telehealth: Payer: Self-pay | Admitting: *Deleted

## 2016-11-15 NOTE — Telephone Encounter (Signed)
Spoke with patient and informed her that her lab results are unremarkable. Advised her carbamazepine is at a therapeutic level, so there's no change in her dosing.  She verbalized understanding, appreciation.

## 2016-11-17 ENCOUNTER — Ambulatory Visit
Admission: RE | Admit: 2016-11-17 | Discharge: 2016-11-17 | Disposition: A | Discharge status: Home / Self Care | Payer: BLUE CROSS/BLUE SHIELD | Source: Ambulatory Visit | Attending: Neurology | Admitting: Neurology

## 2016-11-17 DIAGNOSIS — R413 Other amnesia: Secondary | ICD-10-CM

## 2016-11-17 DIAGNOSIS — R569 Unspecified convulsions: Secondary | ICD-10-CM | POA: Diagnosis not present

## 2016-11-19 ENCOUNTER — Telehealth: Payer: Self-pay | Admitting: Neurology

## 2016-11-19 DIAGNOSIS — R269 Unspecified abnormalities of gait and mobility: Secondary | ICD-10-CM

## 2016-11-19 NOTE — Telephone Encounter (Signed)
I called the patient. The CT of the head is essentially normal. She has some impairment of position sense in the feet, we will check NCV, if this is normal, may consider getting off of carbamazepine.  CT head 11/18/16:  IMPRESSION:  This CT scan of the head without contrast shows the following: 1.   Minimal age-related chronic microvascular ischemic change. 2.   Chronic ethmoid sinusitis 3.   Brain volume is normal. 4.   There are no acute findings.

## 2016-11-23 ENCOUNTER — Telehealth: Payer: Self-pay | Admitting: Neurology

## 2016-11-23 MED ORDER — MEMANTINE HCL 28 X 5 MG & 21 X 10 MG PO TABS: ORAL_TABLET | ORAL | 0 refills | Status: DC

## 2016-11-23 NOTE — Telephone Encounter (Signed)
Patients husband called office would like the results of the most recent 2 test patient had done and if Dr. Jannifer Franklin has any conclusions at this point or what the next steps are.  Per husband her memory has been getting worse patient has been asking same questions over and over.  Please call work number listed until 3:00 after 3:00 please call cell phone [phone]

## 2016-11-23 NOTE — Addendum Note (Signed)
Addended by: Kathrynn Ducking on: 11/23/2016 12:41 PM   Modules accepted: Orders

## 2016-11-23 NOTE — Telephone Encounter (Signed)
I called the husband. His main concern is that the patient is developing cognitive changes, memory problems. This has worsened over the last 6 months.  CT the brain did not show severe cortical atrophy.  Blood work was unremarkable. We will start Namenda for memory, Aricept cannot be used given the seizure history.

## 2016-12-09 ENCOUNTER — Other Ambulatory Visit: Payer: Self-pay | Admitting: Cardiology

## 2016-12-11 NOTE — Telephone Encounter (Signed)
Rx(s) sent to pharmacy electronically.  

## 2016-12-20 ENCOUNTER — Ambulatory Visit (INDEPENDENT_AMBULATORY_CARE_PROVIDER_SITE_OTHER): Payer: BLUE CROSS/BLUE SHIELD | Admitting: Neurology

## 2016-12-20 ENCOUNTER — Ambulatory Visit (INDEPENDENT_AMBULATORY_CARE_PROVIDER_SITE_OTHER): Payer: Self-pay | Admitting: Neurology

## 2016-12-20 ENCOUNTER — Encounter: Payer: Self-pay | Admitting: Neurology

## 2016-12-20 ENCOUNTER — Other Ambulatory Visit: Payer: Self-pay | Admitting: *Deleted

## 2016-12-20 DIAGNOSIS — R262 Difficulty in walking, not elsewhere classified: Secondary | ICD-10-CM | POA: Diagnosis not present

## 2016-12-20 DIAGNOSIS — R269 Unspecified abnormalities of gait and mobility: Secondary | ICD-10-CM

## 2016-12-20 DIAGNOSIS — R2689 Other abnormalities of gait and mobility: Secondary | ICD-10-CM

## 2016-12-20 DIAGNOSIS — R569 Unspecified convulsions: Secondary | ICD-10-CM

## 2016-12-20 MED ORDER — EZETIMIBE 10 MG PO TABS: 10.0000 mg | ORAL_TABLET | Freq: Every day | ORAL | 3 refills | Status: DC

## 2016-12-20 NOTE — Progress Notes (Signed)
Please refer to EMG and nerve conduction study procedure note. 

## 2016-12-20 NOTE — Procedures (Signed)
     HISTORY:  Rachel Clark is a 66 year old patient with a history of bilateral foot pain and decreased position sensation both feet. The patient is being evaluated for possible neuropathy.  NERVE CONDUCTION STUDIES:  Nerve conduction studies were performed on both lower extremities. The distal motor latencies and motor amplitudes for the peroneal and posterior tibial nerves were within normal limits bilaterally. The nerve conduction velocities for these nerves were normal bilaterally with exception of some slight slowing for the posterior tibial nerves bilaterally. The sensory latencies for the sural and peroneal nerves were within normal limits bilaterally. The F wave latencies for the posterior tibial and peroneal nerves were prolonged bilaterally.  EMG STUDIES:  EMG study was performed on the right lower extremity:  The tibialis anterior muscle reveals 2 to 4K motor units with full recruitment. No fibrillations or positive waves were seen. The peroneus tertius muscle reveals 2 to 4K motor units with full recruitment. No fibrillations or positive waves were seen. The medial gastrocnemius muscle reveals 1 to 3K motor units with full recruitment. No fibrillations or positive waves were seen. The vastus lateralis muscle reveals 2 to 4K motor units with full recruitment. No fibrillations or positive waves were seen. The iliopsoas muscle reveals 2 to 4K motor units with full recruitment. No fibrillations or positive waves were seen. The biceps femoris muscle (long head) reveals 2 to 4K motor units with full recruitment. No fibrillations or positive waves were seen. The lumbosacral paraspinal muscles were tested at 3 levels, and revealed no abnormalities of insertional activity at all 3 levels tested. There was good relaxation.   IMPRESSION:  Nerve conduction studies done on both lower extremities does not clearly show evidence of a peripheral neuropathy. Minimal slowing of the posterior  tibial nerves were seen bilaterally. EMG evaluation of the right lower extremity was normal, there is no evidence of an overlying lumbosacral radiculopathy.  Jill Alexanders MD 12/20/2016 3:46 PM  [facility] [address] [place], [place] [address]  Phone [phone] Fax [phone]

## 2016-12-24 ENCOUNTER — Other Ambulatory Visit: Payer: Self-pay | Admitting: Neurology

## 2016-12-25 ENCOUNTER — Other Ambulatory Visit: Payer: Self-pay | Admitting: Nurse Practitioner

## 2016-12-26 NOTE — Telephone Encounter (Signed)
Called and spoke with pt. She states she is taking Namenda titration pack right. In third week of titration. She does not need refill on this. Advised her to contact us once she gets to 10mg  twice daily per CW,MD instructions. We will then send new rx for maintenance dose. She verbalized understanding.

## 2017-01-06 ENCOUNTER — Other Ambulatory Visit: Payer: Self-pay | Admitting: Cardiology

## 2017-01-09 ENCOUNTER — Encounter: Payer: Self-pay | Admitting: *Deleted

## 2017-01-09 ENCOUNTER — Other Ambulatory Visit: Payer: Self-pay | Admitting: Neurology

## 2017-01-09 MED ORDER — MEMANTINE HCL 10 MG PO TABS: 10.0000 mg | ORAL_TABLET | Freq: Two times a day (BID) | ORAL | 4 refills | Status: DC

## 2017-01-09 NOTE — Telephone Encounter (Signed)
Called and spoke with patient. She stated she is on the last week of Namenda titration pak. Needs maintenance dose called in. She is taking 10mg  twice daily.  She would like rx sent to: [id number] [place] [place] [address] her to call back if she has any further questions or concerns. She verbalized understanding.

## 2017-01-22 ENCOUNTER — Telehealth: Payer: Self-pay | Admitting: Neurology

## 2017-01-22 MED ORDER — CARBAMAZEPINE ER 100 MG PO TB12: ORAL_TABLET | ORAL | 0 refills | Status: DC

## 2017-01-22 MED ORDER — ETHOSUXIMIDE 250 MG PO CAPS: ORAL_CAPSULE | ORAL | 0 refills | Status: DC

## 2017-01-22 MED ORDER — MEMANTINE HCL 10 MG PO TABS: 10.0000 mg | ORAL_TABLET | Freq: Two times a day (BID) | ORAL | 0 refills | Status: DC

## 2017-01-22 NOTE — Telephone Encounter (Signed)
E-scribed 90 day supply for rx carbamazepine, zarontin, memantine to pt pharmacy as requested.

## 2017-01-22 NOTE — Addendum Note (Signed)
Addended by: Hope Pigeon on: 01/22/2017 12:26 PM   Modules accepted: Orders

## 2017-01-22 NOTE — Telephone Encounter (Signed)
Pt calling asking for a 3 month supply of all her medications [facility]  [id number] [place], [place] [phone] (Phone) [phone] (Fax)

## 2017-02-26 ENCOUNTER — Other Ambulatory Visit: Payer: Self-pay

## 2017-02-26 MED ORDER — ISOSORBIDE MONONITRATE ER 30 MG PO TB24: 30.0000 mg | ORAL_TABLET | Freq: Every day | ORAL | 3 refills | Status: DC

## 2017-02-27 ENCOUNTER — Other Ambulatory Visit: Payer: Self-pay

## 2017-02-27 MED ORDER — ISOSORBIDE MONONITRATE ER 30 MG PO TB24: 30.0000 mg | ORAL_TABLET | Freq: Every day | ORAL | 3 refills | Status: DC

## 2017-03-03 ENCOUNTER — Other Ambulatory Visit: Payer: Self-pay | Admitting: Nurse Practitioner

## 2017-03-21 ENCOUNTER — Telehealth: Payer: Self-pay | Admitting: Neurology

## 2017-03-21 NOTE — Telephone Encounter (Signed)
Patient requesting new Rx for carbamazepine (TEGRETOL XR) 100 MG 12 hr tablet [facility] [address] She has changed pharmacies.

## 2017-03-21 NOTE — Telephone Encounter (Signed)
Called pt. Advised we sent [facility] supply [facility] [address]. She will contact pharmacy. She will call back with any further questions/concerns.

## 2017-04-03 DIAGNOSIS — Z23 Encounter for immunization: Secondary | ICD-10-CM | POA: Diagnosis not present

## 2017-04-06 DIAGNOSIS — I2510 Atherosclerotic heart disease of native coronary artery without angina pectoris: Secondary | ICD-10-CM | POA: Diagnosis not present

## 2017-04-06 DIAGNOSIS — J0100 Acute maxillary sinusitis, unspecified: Secondary | ICD-10-CM | POA: Diagnosis not present

## 2017-04-06 DIAGNOSIS — J3089 Other allergic rhinitis: Secondary | ICD-10-CM | POA: Diagnosis not present

## 2017-04-07 ENCOUNTER — Emergency Department (HOSPITAL_BASED_OUTPATIENT_CLINIC_OR_DEPARTMENT_OTHER)
Admission: EM | Admit: 2017-04-07 | Discharge: 2017-04-07 | Disposition: A | Discharge status: Home / Self Care | Payer: Medicare Other | Attending: Emergency Medicine | Admitting: Emergency Medicine

## 2017-04-07 ENCOUNTER — Encounter (HOSPITAL_BASED_OUTPATIENT_CLINIC_OR_DEPARTMENT_OTHER): Payer: Self-pay | Admitting: *Deleted

## 2017-04-07 DIAGNOSIS — Z950 Presence of cardiac pacemaker: Secondary | ICD-10-CM | POA: Diagnosis not present

## 2017-04-07 DIAGNOSIS — E039 Hypothyroidism, unspecified: Secondary | ICD-10-CM | POA: Insufficient documentation

## 2017-04-07 DIAGNOSIS — Z87891 Personal history of nicotine dependence: Secondary | ICD-10-CM | POA: Diagnosis not present

## 2017-04-07 DIAGNOSIS — I252 Old myocardial infarction: Secondary | ICD-10-CM | POA: Diagnosis not present

## 2017-04-07 DIAGNOSIS — M79651 Pain in right thigh: Secondary | ICD-10-CM | POA: Insufficient documentation

## 2017-04-07 DIAGNOSIS — Z7982 Long term (current) use of aspirin: Secondary | ICD-10-CM | POA: Insufficient documentation

## 2017-04-07 DIAGNOSIS — I2510 Atherosclerotic heart disease of native coronary artery without angina pectoris: Secondary | ICD-10-CM | POA: Diagnosis not present

## 2017-04-07 DIAGNOSIS — Z79899 Other long term (current) drug therapy: Secondary | ICD-10-CM | POA: Diagnosis not present

## 2017-04-07 MED ORDER — IBUPROFEN 600 MG PO TABS: 600.0000 mg | ORAL_TABLET | Freq: Three times a day (TID) | ORAL | 0 refills | Status: DC | PRN

## 2017-04-07 MED ORDER — HYDROCODONE-ACETAMINOPHEN 5-325 MG PO TABS: 1.0000 | ORAL_TABLET | ORAL | 0 refills | Status: DC | PRN

## 2017-04-07 MED ORDER — OXYCODONE-ACETAMINOPHEN 5-325 MG PO TABS
2.0000 | ORAL_TABLET | Freq: Once | ORAL | Status: AC
  Administered 2017-04-07: 2 via ORAL
  Filled 2017-04-07: qty 2

## 2017-04-07 MED ORDER — OXYCODONE-ACETAMINOPHEN 5-325 MG PO TABS: 1.0000 | ORAL_TABLET | Freq: Once | ORAL | Status: DC

## 2017-04-07 MED ORDER — IBUPROFEN 400 MG PO TABS
600.0000 mg | ORAL_TABLET | Freq: Once | ORAL | Status: AC
  Administered 2017-04-07: 600 mg via ORAL
  Filled 2017-04-07: qty 1

## 2017-04-07 NOTE — ED Triage Notes (Addendum)
Took amoxicillin last pm between 6 and 8 pm,  About 4 hours after talking started having rt thigh pain radiating to knee,  Read where side affect of this meds could be cramps   Ambulatory to tx room

## 2017-04-07 NOTE — ED Provider Notes (Signed)
[facility] Provider Note   CSN: [id number] Arrival date & time: 04/07/17  1829     History   Chief Complaint Chief Complaint  Patient presents with  . Leg Pain    HPI Rachel Clark is a 66 y.o. female.  HPI Patient presents to the emergency department complaining of increasing right lateral thigh discomfort over the past 8-12 hours.  Her husband reports that she did have a trip yesterday and several hours later began complaining of the pain in her right lateral thigh.  She states there is a sensation of numbness and tingling down into her right lower leg.  She denies weakness of her lower leg.  No fevers or chills.  No new rash.  No history of shingles.  No history DVT or pulmonary embolism.  No recent long travel or surgery.  Denies medial thigh pain.  No new swelling of the right lower extremity.  Denies pain with passive range of motion of the right hip, right knee, right ankle.  Reports the majority of the pain is with palpation of the right lateral thigh and active hip flexion.  Denies back pain.  No abdominal pain.  Patient has not tried any medication prior to arrival.  She was started on amoxicillin for possible "sinus infection".  She took her first dose last night and is unsure if this could be a reaction to the amoxicillin.   Past Medical History:  Diagnosis Date  . Arthritis    "fingers" (06/29/2016)  . Chronic lower back pain   . Colon cancer ([place]) 12/04/2011   s/p Laparoscopic-assisted transverse colectomy on 12/19/2011 by Dr. Donne Hazel.  pT3 N0 M0.   . Coronary artery disease    a. remote NSTEMI with PCI to the RCA; b. S/P CABG in 2007; c. 2010 - 2 v CAD with L-LAD and S-RCA patent, small caliber Dx 70% (treated medically - no amenable to PCI), CFX free of significant disease, normal LVF. d. 10/2014 Lexi MV: EF 61%, no ischemia/infarct.  . Depression   . Dyslipidemia   . Fibromyalgia   . Gastric ulcer   . GERD (gastroesophageal reflux disease)   .  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 Headache    "weekly" (06/29/2016)  . Hyperlipidemia   . Hypothyroid   . Iron deficiency anemia 11/17/2011  . Memory change 11/13/2016  . Monoclonal gammopathy of unknown significance   . Myocardial infarction ([place]) 2007  . Presence of permanent cardiac pacemaker   . Syncope and collapse    pacemaker implanted  . Unstable angina [facility])     Patient Active Problem List   Diagnosis Date Noted  . Memory change 11/13/2016  . Acute bronchitis 03/23/2016  . Chest pain with high risk for cardiac etiology 11/05/2015  . Essential hypertension 08/25/2015  . Sick sinus syndrome ([place]) 08/16/2015  . Syncope and collapse 08/10/2015  . CAD, multiple vessel   . Chest pain   . Cardiac pacemaker in situ 07/25/2015  . Angina pectoris ([place]) 03/24/2015  . Unstable angina ([place]) 10/26/2014  . Seizures ([place]) 02/19/2014  . Lung nodule 02/06/2013  . Thyroid nodule 02/06/2013  . History of colon cancer 12/04/2011  . GERD (gastroesophageal reflux disease) 08/17/2011  . Monoclonal gammopathy of unknown significance   . Hypothyroid   . Coronary artery disease, post CABG 2007    . Dyslipidemia     Past Surgical History:  Procedure Laterality Date  .  ANTERIOR CERVICAL DECOMP/DISCECTOMY FUSION  1980's  . BACK SURGERY    . CARDIAC CATHETERIZATION  05/20/2009   obstructive native vessel disease in LAD, RCA, and first diagonal, patent vein graft to distal RCA and LIMA to LAD,normal. ef 60%  . CARDIAC CATHETERIZATION N/A 03/24/2015   Procedure: Left Heart Cath and Cors/Grafts Angiography;  Surgeon: Wellington Hampshire, MD;  Location: [place] CV LAB;  Service: Cardiovascular;  Laterality: N/A;  . CARDIAC CATHETERIZATION N/A 07/28/2015   Procedure: Left Heart Cath and Coronary Angiography;  Surgeon: Troy Sine, MD;  Location: [place] CV LAB;  Service: Cardiovascular;  Laterality: N/A;  . CARDIAC  CATHETERIZATION N/A 11/09/2015   Procedure: Coronary Stent Intervention;  Surgeon: Sherren Mocha, MD;  Location: [place] CV LAB;  Service: Cardiovascular;  Laterality: N/A;  . CARDIAC CATHETERIZATION N/A 11/09/2015   Procedure: Left Heart Cath and Coronary Angiography;  Surgeon: Sherren Mocha, MD;  Location: [place] [facility] LAB;  Service: Cardiovascular;  Laterality: N/A;  . CARDIAC CATHETERIZATION N/A 06/29/2016   Procedure: Left Heart Cath and Coronary Angiography;  Surgeon: Nelva Bush, MD;  Location: [place] CV LAB;  Service: Cardiovascular;  Laterality: N/A;  . CARDIAC CATHETERIZATION N/A 06/29/2016   Procedure: Intravascular Pressure Wire/FFR Study;  Surgeon: Nelva Bush, MD;  Location: [place] CV LAB;  Service: Cardiovascular;  Laterality: N/A;  . CARDIAC CATHETERIZATION N/A 06/29/2016   Procedure: Coronary Balloon Angioplasty;  Surgeon: Nelva Bush, MD;  Location: [place] CV LAB;  Service: Cardiovascular;  Laterality: N/A;  . COLON RESECTION  12/19/2011   Procedure: COLON RESECTION LAPAROSCOPIC;  Surgeon: Rolm Bookbinder, MD;  Location: [place];  Service: General;  Laterality: N/A;  laparoscopic hand assisted partial colon resection  . COLON SURGERY    . COLONOSCOPY    . CORONARY ANGIOPLASTY WITH STENT PLACEMENT  ~ 2007   1 stent  . CORONARY ARTERY BYPASS GRAFT  2007   "CABG X2"  . DILATION AND CURETTAGE OF UTERUS  1973  . EP IMPLANTABLE DEVICE N/A 03/25/2015   MDT Advisa DR pacemaker implanted by Dr Rayann Heman for transient complete heart block and syncope  . INSERT / REPLACE / REMOVE PACEMAKER    . PORT-A-CATH REMOVAL N/A 06/12/2013   Procedure: REMOVAL PORT-A-CATH;  Surgeon: Rolm Bookbinder, MD;  Location: [place];  Service: General;  Laterality: N/A;  . PORTACATH PLACEMENT  06/04/2012   Procedure: INSERTION PORT-A-CATH;  Surgeon: Rolm Bookbinder, MD;  Location: [facility];  Service: General;  Laterality: N/A;  Insertion of port-a-cath   .  POSTERIOR LAMINECTOMY / DECOMPRESSION CERVICAL SPINE  1990s  . VAGINAL HYSTERECTOMY      OB History    No data available       Home Medications    Prior to Admission medications   Medication Sig Start Date End Date Taking? Authorizing Provider  acetaminophen (TYLENOL) 500 MG tablet Take 500-1,000 mg by mouth daily as needed for mild pain or headache.    [provider]  amLODipine (NORVASC) 10 MG tablet Take 10 mg by mouth daily.     [provider]  aspirin 81 MG chewable tablet Chew 1 tablet (81 mg total) by mouth daily. 06/30/16   Arbutus Leas, NP  Azelastine HCl 0.15 % SOLN Place 1 spray into both nostrils daily as needed (seasonal allergies).  12/22/14   [provider]  carbamazepine (TEGRETOL XR) 100 MG 12 hr tablet TAKE 5 TABLETS TWICE A DAY 01/22/17   Kathrynn Ducking, MD  carvedilol (COREG) 12.5 MG tablet TAKE 1 TABLET (12.5 MG TOTAL) BY MOUTH 2 (TWO) TIMES DAILY WITH A MEAL. 09/04/16   [name], Peter M, MD  DULoxetine (CYMBALTA) 60 MG capsule Take 60 mg by mouth daily.     [provider]  estradiol (VIVELLE-DOT) 0.05 MG/24HR patch Place 1 patch onto the skin 2 (two) times a week. Sunday, Wednesday    [provider]  ethosuximide (ZARONTIN) 250 MG capsule Take 2 capsule (500mg ) in the morning, , and 2 capsules (500mg ) at night 01/22/17   Kathrynn Ducking, MD  ezetimibe (ZETIA) 10 MG tablet Take 1 tablet (10 mg total) by mouth daily. 12/20/16   [name], Peter M, MD  ezetimibe (ZETIA) 10 MG tablet TAKE 1 TABLET DAILY 01/08/17   [name], Peter M, MD  HYDROcodone-acetaminophen (NORCO/VICODIN) 5-325 MG tablet Take 1 tablet by mouth every 4 (four) hours as needed for moderate pain. 04/07/17   Jola Schmidt, MD  ibuprofen (ADVIL,MOTRIN) 600 MG tablet Take 1 tablet (600 mg total) by mouth every 8 (eight) hours as needed. 04/07/17   Jola Schmidt, MD  isosorbide mononitrate (IMDUR) 30 MG 24 hr tablet Take 1 tablet (30 mg total) by mouth daily. 02/27/17    [name], Peter M, MD  levothyroxine (SYNTHROID, LEVOTHROID) 50 MCG tablet Take 50 mcg by mouth daily.     [provider]  memantine (NAMENDA) 10 MG tablet Take 1 tablet (10 mg total) by mouth 2 (two) times daily. 01/22/17   Kathrynn Ducking, MD  nitroGLYCERIN (NITROSTAT) 0.4 MG SL tablet Place 1 tablet (0.4 mg total) under the tongue every 5 (five) minutes as needed. For chest pain. Patient taking differently: Place 0.4 mg under the tongue every 5 (five) minutes as needed for chest pain.  04/05/15   Burtis Junes, NP  rosuvastatin (CRESTOR) 5 MG tablet Take 1 tablet by mouth up to 3 times weekly as directed 07/06/16   [name], Peter M, MD  tiZANidine (ZANAFLEX) 2 MG tablet Take 2-4 mg by mouth at bedtime.  01/06/15   [provider]    Family History Family History  Problem Relation Age of Onset  . Heart attack Father   . Heart disease Father   . Heart attack Brother 49  . Cancer Mother        lung  . Stroke Neg Hx     Social History Social History  Substance Use Topics  . Smoking status: Former Smoker    Packs/day: 0.30    Years: 20.00    Types: Cigarettes    Quit date: 06/06/1981  . Smokeless tobacco: Never Used  . Alcohol use 0.6 oz/week    1 Glasses of wine per week     Allergies   Aspirin; Erythromycin; Lisinopril; Niacin and related; Statins; Sulfa drugs cross reactors; Tetracyclines & related; and Penicillins   Review of Systems Review of Systems  All other systems reviewed and are negative.    Physical Exam Updated Vital Signs BP (!) 168/73 (BP Location: Right Arm)   Pulse 78   Temp 97.6 F (36.4 C) (Oral)   Resp 18   Ht 5\' 9"  (1.753 m)   Wt 84.8 kg (187 lb)   SpO2 100%   BMI 27.62 kg/m   Physical Exam  Constitutional: She is oriented to person, place, and time. She appears well-developed and well-nourished.  HENT:  Head: Normocephalic.  Eyes: EOM are normal.  Neck: Normal range of motion.  Pulmonary/Chest: Effort normal.    Abdominal:  She exhibits no distension.  Musculoskeletal:  Normal PT and DP pulse in the right foot.  Compartments of the right lower extremity are soft.  No swelling of the right lower extremity as compared to left.  Full range of motion of the right hip, right knee, right ankle.  Tenderness along the right IT band.  No erythema of the right thigh  Neurological: She is alert and oriented to person, place, and time.  Psychiatric: She has a normal mood and affect.  Nursing note and vitals reviewed.    ED Treatments / Results  Labs (all labs ordered are listed, but only abnormal results are displayed) Labs Reviewed - No data to display  EKG  EKG Interpretation None       Radiology No results found.  Procedures Procedures (including critical care time)  Medications Ordered in ED Medications  ibuprofen (ADVIL,MOTRIN) tablet 600 mg (600 mg Oral Given 04/07/17 0549)  oxyCODONE-acetaminophen (PERCOCET/ROXICET) 5-325 MG per tablet 2 tablet (2 tablets Oral Given 04/07/17 0549)     Initial Impression / Assessment and Plan / ED Course  I have reviewed the triage vital signs and the nursing notes.  Pertinent labs & imaging results that were available during my care of the patient were reviewed by me and considered in my medical decision making (see chart for details).     Patient is overall well-appearing.  I do not think she needs laboratory studies or x-rays.  Doubt DVT.  No swelling of the right lower extremity.  No signs of suggest cellulitis.  Normal pulses therefore ruling out ischemic leg.  This seems to be more of an issue related to her right IT band.  Recommend anti-inflammatories and close primary care follow-up.  Patient and husband understand to return to the ER for new or worsening symptoms  Final Clinical Impressions(s) / ED Diagnoses   Final diagnoses:  Acute pain of right thigh    New Prescriptions Discharge Medication List as of 04/07/2017  5:49 AM    START  taking these medications   Details  HYDROcodone-acetaminophen (NORCO/VICODIN) 5-325 MG tablet Take 1 tablet by mouth every 4 (four) hours as needed for moderate pain., Starting Sat 04/07/2017, Print    ibuprofen (ADVIL,MOTRIN) 600 MG tablet Take 1 tablet (600 mg total) by mouth every 8 (eight) hours as needed., Starting Sat 04/07/2017, Print         Jola Schmidt, MD 04/07/17 [phone]

## 2017-04-24 DIAGNOSIS — R42 Dizziness and giddiness: Secondary | ICD-10-CM | POA: Diagnosis not present

## 2017-04-24 DIAGNOSIS — R5383 Other fatigue: Secondary | ICD-10-CM | POA: Diagnosis not present

## 2017-04-24 DIAGNOSIS — E559 Vitamin D deficiency, unspecified: Secondary | ICD-10-CM | POA: Diagnosis not present

## 2017-04-27 ENCOUNTER — Encounter: Payer: Self-pay | Admitting: Psychology

## 2017-04-27 ENCOUNTER — Encounter: Payer: Self-pay | Admitting: Neurology

## 2017-04-27 ENCOUNTER — Ambulatory Visit (INDEPENDENT_AMBULATORY_CARE_PROVIDER_SITE_OTHER): Payer: Medicare Other | Admitting: Neurology

## 2017-04-27 VITALS — BP 154/91 | HR 75 | Ht 69.0 in | Wt 192.0 lb

## 2017-04-27 DIAGNOSIS — R569 Unspecified convulsions: Secondary | ICD-10-CM | POA: Diagnosis not present

## 2017-04-27 DIAGNOSIS — R413 Other amnesia: Secondary | ICD-10-CM | POA: Diagnosis not present

## 2017-04-27 DIAGNOSIS — I2510 Atherosclerotic heart disease of native coronary artery without angina pectoris: Secondary | ICD-10-CM

## 2017-04-27 DIAGNOSIS — G479 Sleep disorder, unspecified: Secondary | ICD-10-CM | POA: Diagnosis not present

## 2017-04-27 MED ORDER — CARBAMAZEPINE ER 100 MG PO TB12: ORAL_TABLET | ORAL | 3 refills | Status: DC

## 2017-04-27 MED ORDER — MEMANTINE HCL 10 MG PO TABS: 10.0000 mg | ORAL_TABLET | Freq: Two times a day (BID) | ORAL | 3 refills | Status: DC

## 2017-04-27 MED ORDER — ETHOSUXIMIDE 250 MG PO CAPS: ORAL_CAPSULE | ORAL | 3 refills | Status: DC

## 2017-04-27 NOTE — Progress Notes (Signed)
Reason for visit: Seizures, memory disturbance  Rachel Clark is an 66 y.o. female  History of present illness:  Rachel Clark is a 66 year old right-handed white female with a history of seizures that have been well controlled for 40 years, she has not wanted to come off of her medications.  She has a history of fibromyalgia and chronic fatigue syndrome, she misses work frequently because she cannot function and she will sleep all day long.  The patient believes that she sleeps well at night.  The patient has had some problems with memory and concentration.  She had been placed on Namenda which seems to have helped her ability to focus somewhat.  Her memory remains stable according to the husband who is with her today.  The patient is tolerating the Namenda well.  She returns to this office for an evaluation.  She did have recent blood work, her vitamin D level was low, she has gone on supplementation.  Past Medical History:  Diagnosis Date  . Arthritis    "fingers" (06/29/2016)  . Chronic lower back pain   . Colon cancer ([place]) 12/04/2011   s/p Laparoscopic-assisted transverse colectomy on 12/19/2011 by Dr. Donne Hazel.  pT3 N0 M0.   . Coronary artery disease    a. remote NSTEMI with PCI to the RCA; b. S/P CABG in 2007; c. 2010 - 2 v CAD with L-LAD and S-RCA patent, small caliber Dx 70% (treated medically - no amenable to PCI), CFX free of significant disease, normal LVF. d. 10/2014 Lexi MV: EF 61%, no ischemia/infarct.  . Depression   . Dyslipidemia   . Fibromyalgia   . Gastric ulcer   . GERD (gastroesophageal reflux disease)   . 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 Headache    "weekly" (06/29/2016)  . Hyperlipidemia   . Hypothyroid   . Iron deficiency anemia 11/17/2011  . Memory change 11/13/2016  . Monoclonal gammopathy of unknown significance   . Myocardial infarction ([place]) 2007  . Presence of permanent  cardiac pacemaker   . Syncope and collapse    pacemaker implanted  . Unstable angina [facility])     Past Surgical History:  Procedure Laterality Date  . ANTERIOR CERVICAL DECOMP/DISCECTOMY FUSION  1980's  . BACK SURGERY    . CARDIAC CATHETERIZATION  05/20/2009   obstructive native vessel disease in LAD, RCA, and first diagonal, patent vein graft to distal RCA and LIMA to LAD,normal. ef 60%  . CARDIAC CATHETERIZATION N/A 03/24/2015   Procedure: Left Heart Cath and Cors/Grafts Angiography;  Surgeon: Wellington Hampshire, MD;  Location: [place] CV LAB;  Service: Cardiovascular;  Laterality: N/A;  . CARDIAC CATHETERIZATION N/A 07/28/2015   Procedure: Left Heart Cath and Coronary Angiography;  Surgeon: Troy Sine, MD;  Location: [place] [facility] LAB;  Service: Cardiovascular;  Laterality: N/A;  . CARDIAC CATHETERIZATION N/A 11/09/2015   Procedure: Coronary Stent Intervention;  Surgeon: Sherren Mocha, MD;  Location: [place] [facility] LAB;  Service: Cardiovascular;  Laterality: N/A;  . CARDIAC CATHETERIZATION N/A 11/09/2015   Procedure: Left Heart Cath and Coronary Angiography;  Surgeon: Sherren Mocha, MD;  Location: [place] [facility] LAB;  Service: Cardiovascular;  Laterality: N/A;  . CARDIAC CATHETERIZATION N/A 06/29/2016   Procedure: Left Heart Cath and Coronary Angiography;  Surgeon: Nelva Bush, MD;  Location: [place] [facility] LAB;  Service: Cardiovascular;  Laterality: N/A;  . CARDIAC CATHETERIZATION N/A 06/29/2016   Procedure:  Intravascular Pressure Wire/FFR Study;  Surgeon: Nelva Bush, MD;  Location: [place] CV LAB;  Service: Cardiovascular;  Laterality: N/A;  . CARDIAC CATHETERIZATION N/A 06/29/2016   Procedure: Coronary Balloon Angioplasty;  Surgeon: Nelva Bush, MD;  Location: [place] [facility];  Service: Cardiovascular;  Laterality: N/A;  . COLON RESECTION  12/19/2011   Procedure: COLON RESECTION LAPAROSCOPIC;  Surgeon: Rolm Bookbinder, MD;  Location: [place];  Service: General;   Laterality: N/A;  laparoscopic hand assisted partial colon resection  . COLON SURGERY    . COLONOSCOPY    . CORONARY ANGIOPLASTY WITH STENT PLACEMENT  ~ 2007   1 stent  . CORONARY ARTERY BYPASS GRAFT  2007   "CABG X2"  . DILATION AND CURETTAGE OF UTERUS  1973  . EP IMPLANTABLE DEVICE N/A 03/25/2015   MDT Advisa DR pacemaker implanted by Dr Rayann Heman for transient complete heart block and syncope  . INSERT / REPLACE / REMOVE PACEMAKER    . PORT-A-CATH REMOVAL N/A 06/12/2013   Procedure: REMOVAL PORT-A-CATH;  Surgeon: Rolm Bookbinder, MD;  Location: [place];  Service: General;  Laterality: N/A;  . PORTACATH PLACEMENT  06/04/2012   Procedure: INSERTION PORT-A-CATH;  Surgeon: Rolm Bookbinder, MD;  Location: [place];  Service: General;  Laterality: N/A;  Insertion of port-a-cath   . POSTERIOR LAMINECTOMY / DECOMPRESSION CERVICAL SPINE  1990s  . VAGINAL HYSTERECTOMY      Family History  Problem Relation Age of Onset  . Heart attack Father   . Heart disease Father   . Heart attack Brother 16  . Cancer Mother        lung  . Stroke Neg Hx     Social history:  reports that she quit smoking about 35 years ago. Her smoking use included Cigarettes. She has a 6.00 pack-year smoking history. She has never used smokeless tobacco. She reports that she drinks about 0.6 oz of alcohol per week . She reports that she does not use drugs.    Allergies  Allergen Reactions  . Aspirin Other (See Comments)    GI upset  . Erythromycin Nausea Only    Gi upset  . Lisinopril Cough  . Niacin And Related     Pt does not recall reactions   . Statins     Muscle pain  . Sulfa Drugs Cross Reactors Swelling  . Tetracyclines & Related Nausea Only  . Penicillins Nausea Only and Rash    Has patient had a PCN reaction causing immediate rash, facial/tongue/throat swelling, SOB or lightheadedness with hypotension: Yes Has patient had a PCN reaction causing severe rash involving mucus membranes or  skin necrosis: No Has patient had a PCN reaction that required hospitalization No Has patient had a PCN reaction occurring within the last 10 years: No If all of the above answers are "NO", then may proceed with Cephalosporin use.     Medications:  Prior to Admission medications   Medication Sig Start Date End Date Taking? Authorizing Provider  acetaminophen (TYLENOL) 500 MG tablet Take 500-1,000 mg by mouth daily as needed for mild pain or headache.   Yes [provider]  amLODipine (NORVASC) 10 MG tablet Take 10 mg by mouth daily.    Yes [provider]  aspirin 81 MG chewable tablet Chew 1 tablet (81 mg total) by mouth daily. 06/30/16  Yes Arbutus Leas, NP  Azelastine HCl 0.15 % SOLN Place 1 spray into both nostrils daily as needed (seasonal allergies).  12/22/14  Yes  [provider]  carbamazepine (TEGRETOL XR) 100 MG 12 hr tablet TAKE 5 TABLETS TWICE A DAY 01/22/17  Yes Kathrynn Ducking, MD  carvedilol (COREG) 12.5 MG tablet TAKE 1 TABLET (12.5 MG TOTAL) BY MOUTH 2 (TWO) TIMES DAILY WITH A MEAL. 09/04/16  Yes [name], Peter M, MD  DULoxetine (CYMBALTA) 60 MG capsule Take 60 mg by mouth daily.    Yes [provider]  estradiol (VIVELLE-DOT) 0.05 MG/24HR patch Place 1 patch onto the skin 2 (two) times a week. Sunday, Wednesday   Yes [provider]  ethosuximide (ZARONTIN) 250 MG capsule Take 2 capsule (500mg ) in the morning, , and 2 capsules (500mg ) at night 01/22/17  Yes Kathrynn Ducking, MD  ezetimibe (ZETIA) 10 MG tablet Take 1 tablet (10 mg total) by mouth daily. 12/20/16  Yes [name], Peter M, MD  ezetimibe (ZETIA) 10 MG tablet TAKE 1 TABLET DAILY 01/08/17  Yes [name], Peter M, MD  HYDROcodone-acetaminophen (NORCO/VICODIN) 5-325 MG tablet Take 1 tablet by mouth every 4 (four) hours as needed for moderate pain. 04/07/17  Yes Jola Schmidt, MD  ibuprofen (ADVIL,MOTRIN) 600 MG tablet Take 1 tablet (600 mg total) by mouth every 8 (eight) hours as  needed. 04/07/17  Yes Jola Schmidt, MD  isosorbide mononitrate (IMDUR) 30 MG 24 hr tablet Take 1 tablet (30 mg total) by mouth daily. 02/27/17  Yes [name], Peter M, MD  levothyroxine (SYNTHROID, LEVOTHROID) 50 MCG tablet Take 50 mcg by mouth daily.    Yes [provider]  memantine (NAMENDA) 10 MG tablet Take 1 tablet (10 mg total) by mouth 2 (two) times daily. 01/22/17  Yes Kathrynn Ducking, MD  nitroGLYCERIN (NITROSTAT) 0.4 MG SL tablet Place 1 tablet (0.4 mg total) under the tongue every 5 (five) minutes as needed. For chest pain. Patient taking differently: Place 0.4 mg under the tongue every 5 (five) minutes as needed for chest pain.  04/05/15  Yes Burtis Junes, NP  rosuvastatin (CRESTOR) 5 MG tablet Take 1 tablet by mouth up to 3 times weekly as directed 07/06/16  Yes [name], Peter M, MD  tiZANidine (ZANAFLEX) 2 MG tablet Take 2-4 mg by mouth at bedtime.  01/06/15  Yes [provider]    ROS:  Out of a complete 14 system review of symptoms, the patient complains only of the following symptoms, and all other reviewed systems are negative.  Chest pain Memory disturbance  There were no vitals taken for this visit.  Physical Exam  General: The patient is alert and cooperative at the time of the examination.  The patient is moderately obese.  Skin: No significant peripheral edema is noted.   Neurologic Exam  Mental status: The patient is alert and oriented x 3 at the time of the examination. The patient has apparent normal recent and remote memory, with an apparently normal attention span and concentration ability.  Mini-Mental status examination done today shows a total score of 27/30.  Patient is able to name 14 animals in 30 seconds.   Cranial nerves: Facial symmetry is present. Speech is normal, no aphasia or dysarthria is noted. Extraocular movements are full. Visual fields are full.  Motor: The patient has good strength in all 4 extremities.  Sensory  examination: Soft touch sensation is symmetric on the face, arms, and legs.  Coordination: The patient has good finger-nose-finger and heel-to-shin bilaterally.  Gait and station: The patient has a normal gait. Tandem gait is slightly unsteady. Romberg is negative. No drift is  seen.  Reflexes: Deep tendon reflexes are symmetric.   CT head 11/18/16:  IMPRESSION: This CT scan of the head without contrast shows the following: 1. Minimal age-related chronic microvascular ischemic change. 2. Chronic ethmoid sinusitis 3. Brain volume is normal. 4. There are no acute findings.  * CT scan images were reviewed online. I agree with the written report.    Assessment/Plan:  1.  History of seizures, well controlled  2.  Fibromyalgia, chronic fatigue syndrome  3.  Reported memory disturbance  The patient will continue the Namenda for now, a prescription was called in for Namenda, carbamazepine, and ethosuximide.  The patient will be sent for neuropsychological evaluation to evaluate the memory.  CT scan of the brain appear to be unremarkable.  No atrophy was seen.  Dementia is prominent in the family, however.  The patient will be sent for a sleep evaluation given her excessive daytime drowsiness.  She will follow-up in 6 months, sooner if needed.  Jill Alexanders MD 04/27/2017 10:00 AM  [facility] [address] [place], [place] [address]  Phone [phone] Fax [phone]

## 2017-05-06 ENCOUNTER — Other Ambulatory Visit: Payer: Self-pay | Admitting: Neurology

## 2017-06-07 ENCOUNTER — Other Ambulatory Visit: Payer: Self-pay | Admitting: Neurology

## 2017-06-24 ENCOUNTER — Other Ambulatory Visit: Payer: Self-pay | Admitting: Neurology

## 2017-07-01 ENCOUNTER — Other Ambulatory Visit: Payer: Self-pay | Admitting: Cardiology

## 2017-07-09 ENCOUNTER — Encounter: Payer: Self-pay | Admitting: Psychology

## 2017-07-09 ENCOUNTER — Ambulatory Visit (INDEPENDENT_AMBULATORY_CARE_PROVIDER_SITE_OTHER): Payer: Medicare Other | Admitting: Psychology

## 2017-07-09 DIAGNOSIS — F329 Major depressive disorder, single episode, unspecified: Secondary | ICD-10-CM

## 2017-07-09 DIAGNOSIS — F32A Depression, unspecified: Secondary | ICD-10-CM

## 2017-07-09 DIAGNOSIS — R413 Other amnesia: Secondary | ICD-10-CM

## 2017-07-09 NOTE — Progress Notes (Signed)
NEUROBEHAVIORAL STATUS EXAM (CPT: [id number])  Name: Rachel Clark Date of Birth: February 16, 1951 Date of Interview: 07/09/2017  Reason for Referral:  Rachel Clark is a 67 y.o. female who is referred for neuropsychological evaluation by Dr. Kathrynn Ducking of [facility] due to concerns about memory/attention difficulties. This patient is accompanied in the office by her husband, but she did not want him to join her in the clinical interview so no informant report was elicited from him.  History of Presenting Problem:  Rachel Clark reported that she is not concerned about her memory or cognitive functioning, but her husband and daughter are. They have told her that she is more forgetful. She does not know how long they have had concerns about this. The patient admits she has occasional word-finding difficulty but does not feel this is a significant problem. She denies short term memory lapses, misplacing/losing items, missing appointments, forgetting to take medications or pay bills, difficulty organizing and sequencing tasks, reduced attention/concentration, distractibility, reduced auditory comprehension, visual-spatial deficits or trouble recalling routes when driving. 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 She denies any difficulty performing her job duties and meeting deadlines, and she notes that her job is very detail oriented. No one at work has commented on her having any difficulties.. She also manages all instrumental ADLs without any reported difficulty. She was most recently seen by Dr. Jannifer Franklin on 04/27/2017; MMSE was 27/30, and she named 14 animals in 30 seconds.  The patient does have a history of seizures in childhood (began in the context of a bad flu); she has not had a seizure since approximately age 42, but she continues on AEDs. The patient reports chronic fatigue. She states she is constantly tired and achy. She sleeps well  at night. She has headaches frequently. She is treated for fibromyalgia. She denied history of head injury or LOC. She is not having any difficulty with balance or walking. She has not had any falls. She is prescribed Namenda.   She reported a longstanding history of depression. She has taken antidepressants for many years. She had counseling a long time ago. She reports that currently her mood is pretty good. She denied dysphoria, hopelessness, helplessness, or suicidal ideation. She endorsed psychosocial stress related to her husband's recent retirement. Dr. Jannifer Franklin' notes indicate that she mentioned stress related to her son being diagnosed with pseudoseizures, but she did not mention this to me today.  She has a family history of dementia in her sister (now deceased, passed away at ~65yo, so likely had early onset dementia) and paternal grandfather.   CT of the head was performed on 11/18/2016. It reportedly showed minimal age-related chronic microvascular ischemic change, no acute findings, normal brain volume.   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 Arthritis    "fingers" (06/29/2016)  . Chronic lower back pain   . Colon cancer ([facility]) 12/04/2011   s/p Laparoscopic-assisted transverse colectomy on 12/19/2011 by Dr. Donne Hazel.  pT3 N0 M0.   . Coronary artery disease    a. remote NSTEMI with PCI to the  RCA; b. S/P CABG in 2007; c. 2010 - 2 v CAD with L-LAD and S-RCA patent, small caliber Dx 70% (treated medically - no amenable to PCI), CFX free of significant disease, normal LVF.  d. 10/2014 Lexi MV: EF 61%, no ischemia/infarct.  . Depression   . Dyslipidemia   . Fibromyalgia   . Gastric ulcer   . GERD (gastroesophageal reflux disease)   . 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 Headache    "weekly" (06/29/2016)  . Hyperlipidemia   . Hypothyroid   . Iron deficiency anemia 11/17/2011  . Memory change 11/13/2016  . Monoclonal gammopathy of unknown significance   . Myocardial infarction ([place]) 2007  . Presence of permanent cardiac pacemaker   . Syncope and collapse    pacemaker implanted  . Unstable angina ([facility])      Current Medications:  Outpatient Encounter Medications as of 07/09/2017  Medication Sig  . acetaminophen (TYLENOL) 500 MG tablet Take 500-1,000 mg by mouth daily as needed for mild pain or headache.  Marland Kitchen amLODipine (NORVASC) 10 MG tablet Take 10 mg by mouth daily.   Marland Kitchen aspirin 81 MG chewable tablet Chew 1 tablet (81 mg total) by mouth daily.  . Azelastine HCl 0.15 % SOLN Place 1 spray into both nostrils daily as needed (seasonal allergies).   . carbamazepine (TEGRETOL XR) 100 MG 12 hr tablet TAKE 5 TABLETS TWICE A DAY  . carvedilol (COREG) 12.5 MG tablet TAKE 1 TABLET (12.5 MG TOTAL) BY MOUTH 2 (TWO) TIMES DAILY WITH A MEAL.  . DULoxetine (CYMBALTA) 60 MG capsule Take 60 mg by mouth daily.   Marland Kitchen estradiol (VIVELLE-DOT) 0.05 MG/24HR patch Place 1 patch onto the skin 2 (two) times a week. Sunday, Wednesday  . ethosuximide (ZARONTIN) 250 MG capsule Take 2 capsule (500mg ) in the morning, , and 2 capsules (500mg ) at night  . ezetimibe (ZETIA) 10 MG tablet Take 1 tablet (10 mg total) by mouth daily.  Marland Kitchen ezetimibe (ZETIA) 10 MG tablet TAKE 1 TABLET DAILY  . HYDROcodone-acetaminophen (NORCO/VICODIN) 5-325 MG tablet Take 1 tablet by mouth every 4 (four) hours as needed for moderate pain.  Marland Kitchen ibuprofen (ADVIL,MOTRIN) 600 MG tablet Take 1 tablet (600 mg total) by mouth every 8 (eight) hours as needed.    . isosorbide mononitrate (IMDUR) 30 MG 24 hr tablet Take 1 tablet (30 mg total) by mouth daily.  Marland Kitchen levothyroxine (SYNTHROID, LEVOTHROID) 50 MCG tablet Take 50 mcg by mouth daily.   . memantine (NAMENDA) 10 MG tablet Take 1 tablet (10 mg total) by mouth 2 (two) times daily.  . nitroGLYCERIN (NITROSTAT) 0.4 MG SL tablet Place 1 tablet (0.4 mg total) under the tongue every 5 (five) minutes as needed. For chest pain. (Patient taking differently: Place 0.4 mg under the tongue every 5 (five) minutes as needed for chest pain. )  . rosuvastatin (CRESTOR) 5 MG tablet Take 1 tablet by mouth up to 3 times weekly as directed  . tiZANidine (ZANAFLEX) 2 MG tablet Take 2-4 mg by mouth at bedtime.    No facility-administered encounter medications on file as of 07/09/2017.      Behavioral Observations:   Appearance: Neatly, casually and appropriately dressed and groomed Gait: Ambulated independently, no gross abnormalities observed Speech: Fluent; normal rate, rhythm and volume. No significant word finding difficulty observed during conversational speech. Thought process: Linear, goal directed Affect: Full, euthymic Interpersonal: Pleasant, appropriate   TESTING: There is  medical necessity to proceed with neuropsychological assessment as the results will be used to aid in differential diagnosis and clinical decision-making and to inform specific treatment recommendations. Per the patient and medical records reviewed, there has been a change in cognitive functioning and a reasonable suspicion of neurocognitive disorder.  Clinical Decision Making: In considering the patient's current level of functioning, level of presumed impairment, nature of symptoms, emotional and behavioral responses during the interview, level of literacy, and observed level of motivation, a battery of tests was selected and communicated to the psychometrician.   Following the clinical interview/neurobehavioral status exam, the patient  completed this full battery of neuropsychological testing with my psychometrician under my supervision (see separate note).   PLAN: The patient will return to see me for a follow-up session at which time her test performances and my impressions and treatment recommendations will be reviewed in detail.  Evaluation ongoing; full report to follow.

## 2017-07-09 NOTE — Progress Notes (Signed)
   Neuropsychology Note  [name] completed 90 minutes of neuropsychological testing with technician, Milana Kidney, BS, under the supervision of Dr. Macarthur Critchley, Licensed Psychologist. The patient did not appear overtly distressed by the testing session, per behavioral observation or via self-report to the technician. Rest breaks were offered.   Communication between the psychologist and technician was ongoing throughout the testing session and changes were made as deemed necessary based on patient performance on testing, technician observations and additional pertinent factors (e.g., current level of functioning, level of presumed impairment, nature of symptoms, emotional and behavioral responses during the status exam/testing, level of literacy, estimated premorbid baseline intellectual abilities, and observed level of motivation/effort).  Rachel Clark will return within 2 weeks for a feedback session with Dr. Si Raider at which time her test performances, clinical impressions and treatment recommendations will be reviewed in detail. The patient understands she can contact our office should she require our assistance before this time.  90 minutes spent face-to-face with patient administering standardized tests Environmental education officer). 30 minutes spent scoring Environmental education officer).  Full report to follow.

## 2017-07-16 NOTE — Progress Notes (Signed)
NEUROPSYCHOLOGICAL EVALUATION   Name:    Rachel Clark  Date of Birth:   [birth year] Date of Interview:  07/09/2017 Date of Testing:  07/09/2017   Date of Feedback:  07/17/2017       Background Information:  Reason for Referral:  Rachel Clark is a 67 y.o. female referred by Dr. Kathrynn Ducking to assess her current level of cognitive functioning and assist in differential diagnosis. The current evaluation consisted of a review of available medical records, an interview with the patient, and the completion of a neuropsychological testing battery. Informed consent was obtained.  History of Presenting Problem:  Rachel Clark reported that she is not concerned about her memory or cognitive functioning, but her husband and daughter are. They have told her that she is more forgetful. She does not know how long they have had concerns about this. The patient admits she has occasional word-finding difficulty but does not feel this is a significant problem. She denies short term memory lapses, misplacing/losing items, missing appointments, forgetting to take medications or pay bills, difficulty organizing and sequencing tasks, reduced attention/concentration, distractibility, reduced auditory comprehension, visual-spatial deficits or trouble recalling routes when driving. 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 She denies any difficulty performing her job duties and meeting deadlines, and she notes that her job is very detail oriented. No one at work has commented on her having any difficulties.. She also manages all instrumental ADLs without any reported difficulty. She was most recently seen by Dr. Jannifer Franklin on 04/27/2017; MMSE was 27/30, and she named 14 animals in 30 seconds.  The patient does have a history of seizures in childhood (began in the context of a bad flu); she has not had a seizure since approximately age 12, but she continues on AEDs. The patient  reports chronic fatigue. She states she is constantly tired and achy. She sleeps well at night. She has headaches frequently. She is treated for fibromyalgia. She denied history of head injury or LOC. She is not having any difficulty with balance or walking. She has not had any falls. She is prescribed Namenda.   She reported a longstanding history of depression. She has taken antidepressants for many years. She had counseling a long time ago. She reports that currently her mood is pretty good. She denied dysphoria, hopelessness, helplessness, or suicidal ideation. She endorsed psychosocial stress related to her husband's recent retirement. Dr. Jannifer Franklin' notes indicate that she mentioned stress related to her son being diagnosed with pseudoseizures, but she did not mention this to me today.  She has a family history of dementia in her sister (now deceased, passed away at ~65yo, so likely had early onset dementia) and paternal grandfather.   CT of the head was performed on 11/18/2016. It reportedly showed minimal age-related chronic microvascular ischemic change, no acute findings, normal brain volume.   Social History: Born/Raised: [place]  Education: Some college Occupational history: Currently employed part-time in [employer], research (life sciences)  Marital history: Married x45 years Children: 2 adult children (son is single and lives in [place]; daughter is married with two children and lives in [place]) Alcohol: Minimal (average one drink per week) Tobacco: Former smoker, quit about 35 years ago   Medical History:  Past Medical History:  Diagnosis Date  . Arthritis    "fingers" (06/29/2016)  . Chronic lower back pain   . Colon cancer ([place]) 12/04/2011   s/p Laparoscopic-assisted transverse colectomy on 12/19/2011 by Dr.  Wakefield.  pT3 N0 M0.   . Coronary artery disease    a. remote NSTEMI with PCI to the RCA; b. S/P CABG in 2007; c. 2010 - 2 v CAD with L-LAD and S-RCA patent, small caliber  Dx 70% (treated medically - no amenable to PCI), CFX free of significant disease, normal LVF. d. 10/2014 Lexi MV: EF 61%, no ischemia/infarct.  . Depression   . Dyslipidemia   . Fibromyalgia   . Gastric ulcer   . GERD (gastroesophageal reflux disease)   . 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 Headache    "weekly" (06/29/2016)  . Hyperlipidemia   . Hypothyroid   . Iron deficiency anemia 11/17/2011  . Memory change 11/13/2016  . Monoclonal gammopathy of unknown significance   . Myocardial infarction ([place]) 2007  . Presence of permanent cardiac pacemaker   . Syncope and collapse    pacemaker implanted  . Unstable angina [facility])     Current medications:  Outpatient Encounter Medications as of 07/17/2017  Medication Sig  . acetaminophen (TYLENOL) 500 MG tablet Take 500-1,000 mg by mouth daily as needed for mild pain or headache.  Marland Kitchen amLODipine (NORVASC) 10 MG tablet Take 10 mg by mouth daily.   Marland Kitchen aspirin 81 MG chewable tablet Chew 1 tablet (81 mg total) by mouth daily.  . Azelastine HCl 0.15 % SOLN Place 1 spray into both nostrils daily as needed (seasonal allergies).   . carbamazepine (TEGRETOL XR) 100 MG 12 hr tablet TAKE 5 TABLETS TWICE A DAY  . carvedilol (COREG) 12.5 MG tablet TAKE 1 TABLET (12.5 MG TOTAL) BY MOUTH 2 (TWO) TIMES DAILY WITH A MEAL.  . DULoxetine (CYMBALTA) 60 MG capsule Take 60 mg by mouth daily.   Marland Kitchen estradiol (VIVELLE-DOT) 0.05 MG/24HR patch Place 1 patch onto the skin 2 (two) times a week. Sunday, Wednesday  . ethosuximide (ZARONTIN) 250 MG capsule Take 2 capsule (547m) in the morning, , and 2 capsules (5096m at night  . ezetimibe (ZETIA) 10 MG tablet Take 1 tablet (10 mg total) by mouth daily.  . Marland Kitchenzetimibe (ZETIA) 10 MG tablet TAKE 1 TABLET DAILY  . HYDROcodone-acetaminophen (NORCO/VICODIN) 5-325 MG tablet Take 1 tablet by mouth every 4 (four) hours as needed for moderate pain.  . Marland Kitchenbuprofen  (ADVIL,MOTRIN) 600 MG tablet Take 1 tablet (600 mg total) by mouth every 8 (eight) hours as needed.  . isosorbide mononitrate (IMDUR) 30 MG 24 hr tablet Take 1 tablet (30 mg total) by mouth daily.  . Marland Kitchenevothyroxine (SYNTHROID, LEVOTHROID) 50 MCG tablet Take 50 mcg by mouth daily.   . memantine (NAMENDA) 10 MG tablet Take 1 tablet (10 mg total) by mouth 2 (two) times daily.  . nitroGLYCERIN (NITROSTAT) 0.4 MG SL tablet Place 1 tablet (0.4 mg total) under the tongue every 5 (five) minutes as needed. For chest pain. (Patient taking differently: Place 0.4 mg under the tongue every 5 (five) minutes as needed for chest pain. )  . rosuvastatin (CRESTOR) 5 MG tablet Take 1 tablet by mouth up to 3 times weekly as directed  . tiZANidine (ZANAFLEX) 2 MG tablet Take 2-4 mg by mouth at bedtime.    No facility-administered encounter medications on file as of 07/17/2017.      Current Examination:  Behavioral Observations:  Appearance: Neatly, casually and appropriately dressed and groomed Gait: Ambulated independently, no gross abnormalities observed Speech: Fluent; normal rate, rhythm and volume. No significant word finding  difficulty observed during conversational speech. Thought process: Linear, goal directed Affect: Full, euthymic during clinical interview. She became tearful during the testing session when she performed poorly on memory tasks. Interpersonal: Pleasant, appropriate Orientation: Oriented to person, place and most aspects of time (did not know the current date but was oriented to month, day and year). Accurately named the current President and his predecessor.   Tests Administered: . Test of Premorbid Functioning (TOPF) . Wechsler Adult Intelligence Scale-Fourth Edition (WAIS-IV): Similarities, Music therapist, Coding and Digit Span subtests . Wechsler Memory Scale-Fourth Edition (WMS-IV) Older Adult Version (ages 81-90): Logical Memory I, II and Recognition subtests  . [employer] Verbal  Learning Test - 2nd Edition (CVLT-2) Short Form . [employer] (WCST) . Repeatable Battery for the Assessment of Neuropsychological Status (RBANS) Form A:  Figure Copy and Recall subtests and Semantic Fluency subtest . Boston Naming Test (BNT) . Boston Diagnostic Aphasia Examination: Complex Ideational Material subtest . Controlled Oral Word Association Test (COWAT) . Trail Making Test A and B . Clock drawing test . Beck Depression Inventory - 2nd Edition (BDI-II) . Generalized Anxiety Disorder - 7 item screener (GAD-7)  Test Results: Note: Standardized scores are presented only for use by appropriately trained professionals and to allow for any future test-retest comparison. These scores should not be interpreted without consideration of all the information that is contained in the rest of the report. The most recent standardization samples from the test publisher or other sources were used whenever possible to derive standard scores; scores were corrected for age, gender, ethnicity and education when available.   Test Scores:  Test Name Raw Score Standardized Score Descriptor  TOPF 60/70 SS= 117 High average  WAIS-IV Subtests     Similarities 27/36 ss= 11 Average  Block Design 24/66 ss= 8 Average  Coding 61/135 ss= 11 Average  Digit Span Forward 10/16 ss= 10 Average  Digit Span Backward 5/16 ss= 6 Low average  WMS-IV Subtests     LM I 26/53 ss= 8 Average  LM II 0/39 ss= 1 Impaired  LM II Recognition 0/23 (pt refused) Cum %: <2 Impaired  RBANS Subtests     Figure Copy 19/20 Z= 0.5 Average  Figure Recall 8/20 Z= -1.4 Borderline  Semantic Fluency 13 Z= -1.7 Borderline  BNT 56/60 T= 51 Average  CVLT-II Scores     Trial 1 3/9 Z= -2.5 Impaired  Trial 4 5/9 Z= -2.5 Impaired  Trials 1-4 total 19/36 T= 32 Borderline  SD Free Recall 5/9 Z= -1.5 Borderline  LD Free Recall 0/9 Z= -2.5 Impaired  LD Cued Recall 1/9 Z= -3.5 Severely impaired  Recognition Discriminability 6/9  hits, 8 false positives Z= -3 Severely impaired  Forced Choice Recognition 9/9  WNL  WCST     Total Errors 13 T= 53 Average  Perseverative Responses 7 T= 52 Average  Perseverative Errors 7 T= 51 Average  Conceptual Level Responses 50 T= 57 High average  Categories Completed 3 >16% WNL  Trials to Complete 1st Category 11 >16% WNL  Failure to Maintain Set 2    BDAE Subtest     Complex Ideational Material 10/12  Below expectation  COWAT-FAS 32 T= 42 Low average  COWAT-Animals 10 T= 30 Impaired  Trail Making Test A  36" 0 errors T= 50 Average  Trail Making Test B  64" 0 errors T= 54 Average  Clock Drawing   WNL  BDI-II 1/63  WNL  GAD-7 0/21  WNL  Description of Test Results:  Premorbid verbal intellectual abilities were estimated to have been within the high average range based on a test of word reading. Psychomotor processing speed was average. Auditory attention and working memory were average to low average, respectively. Visual-spatial construction was average. Language abilities were variable. Specifically, confrontation naming was average, and semantic verbal fluency was borderline impaired to impaired. Auditory comprehension of complex ideational material was mildly below expectation. With regard to verbal memory, encoding and acquisition of non-contextual information (i.e., word list) was borderline impaired. After a brief distracter task, free recall was borderline impaired (5/9 items recalled). After a delay, free recall was impaired (0/9 items recalled). Cued recall was severely impaired (1/9 items recalled). Across all recall trials, she committed an elevated number of intrusion errors. Performance on a yes/no recognition task was severely impaired. On another verbal memory test, encoding and acquisition of contextual auditory information (i.e., short stories) was average. After a delay, free recall was impaired (she did not recall any aspects of the original two stories).  Performance on a yes/no recognition task was severely impaired and she refused to complete it due to inability to remember information. With regard to non-verbal memory, delayed free recall of visual information was borderline impaired. Executive functioning was within normal limits overall. Mental flexibility and set-shifting were average on Trails B. Verbal fluency with phonemic search restrictions was low average. Verbal abstract reasoning was average. Deductive reasoning was average overall, although she did demonstrate some failure to maintain set. Performance on a clock drawing task was intact. On self-report measures of mood, the patient's responses were not indicative of clinically significant depression or anxiety at the present time.    Clinical Impressions: Amnestic Mild Cognitive Impairment. Results of cognitive testing revealed impairment in several domains of cognitive function relative to age-based normative sample and relative to the patient's estimated baseline intellectual abilities. She demonstrated the most prominent impairment in retrieval of newly learned information, and she did not benefit from cueing or recognition formation, suggesting consolidation dysfunction. Semantic fluency also was impaired. Auditory comprehension of complex ideational material and auditory attention/working memory were also somewhat below expectation.  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 However, I am concerned about underlying Alzheimer's disease based on her cognitive profile.  Fortunately, she is not reporting significant depression or anxiety at the present time, but she does endorse psychosocial stressors. These stressors may be exacerbating underlying cognitive impairment, but I do not believe they are solely responsible for the  impairment seen on this examination.    Recommendations/Plan: Based on the findings of the present evaluation, the following recommendations are offered:  1. The patient and family will benefit from education and support regarding the diagnosis. They are also referred to the Alzheimer's Association for additional resources on MCI. 2. I am surprised that the patient is reportedly having no difficulty or change in performance in her job duties, given the extent of memory consolidation dysfunction on testing. If her job was rote and routine, and she had been doing the exact same things for a number of years, I could see how she could continue to adequately perform, but I would think managing new projects and monitoring status of various details would be difficult for her at this time. She (and possibly her family) could consider having a meeting with her supervisor to make sure  performance is stable and she is meeting all job requirements. 3. Oversight/Monitoring of the patient's management of medications and finances is recommended, just to ensure no mistakes are made due to memory deficit. 4. She would benefit from having a family member attend all important appointments with her in which new information will be presented (e.g., medical appointments, legal/financial appointments).  5. If not already completed, it is advised that the patient complete advance directives, power of attorney, and estate planning at this time. 6. The patient performed well on a test highly correlated with driving ability. I do not have significant concerns about her driving familiar routes to local destinations, from a cognitive perspective. If there are other concerns about driving abilities, a formal driving evaluation may be helpful.  7. I would like to see the patient back in 12-18 months for neuropsychological re-evaluation in order to monitor cognitive status, track any progression of changes, and further assist with  treatment planning.   Feedback to Patient: Rachel Clark and her husband returned for a feedback appointment on 07/17/2017 to review the results of her neuropsychological evaluation with this provider. 20 minutes face-to-face time was spent reviewing her test results, my impressions and my recommendations as detailed above.    Total time spent on this patient's case: [phone] for neurobehavioral exam with psychologist; [phone] and [phone] units of testing/scoring by psychometrician under psychologist's supervision; [phone] and [phone] units for integration of patient data, interpretation of standardized test results and clinical data, clinical decision making, treatment planning and preparation of this report, and interactive feedback with review of results to the patient/family by psychologist.      Thank you for your referral of Rachel Clark. Please feel free to contact me if you have any questions or concerns regarding this report.

## 2017-07-17 ENCOUNTER — Encounter: Payer: Self-pay | Admitting: Psychology

## 2017-07-17 ENCOUNTER — Ambulatory Visit: Payer: Medicare Other | Admitting: Psychology

## 2017-07-17 DIAGNOSIS — R413 Other amnesia: Secondary | ICD-10-CM

## 2017-07-17 DIAGNOSIS — G3184 Mild cognitive impairment, so stated: Secondary | ICD-10-CM

## 2017-07-17 NOTE — Patient Instructions (Signed)
Clinical Impressions: Amnestic Mild Cognitive Impairment.  Formal cognitive evaluation revealed impairments in the following areas: Memory retrieval and consolidation of new information Semantic fluency (the ability to retrieve words in a certain category) Auditory attention and working memory were also mildly below expectation  Areas of strength included: processing speed  visual-spatial construction  confrontation naming mental flexibility/set-shifting reasoning abilities  Because no associated functional decline is reported in daily life, criteria for a dementia syndrome are NOT met.  A diagnosis of amnestic Mild Cognitive Impairment (MCI) is most appropriate.   We will need to monitor this over time, as cognitive functioning in people with MCI can decline over time. I would like to do a re-evaluation in 12-18 months.   Oversight/Monitoring of management of medications and finances by a family member is recommended, just to ensure no mistakes are made due to memory deficit.  It's also recommended that you have a family member attend all important appointments with you in which new information will be presented (e.g., medical appointments, legal/financial appointments).   We advise all patients to complete advance directives and future planning including power of attorney for both finances and health care decisions, and estate planning, if these have not already been completed.

## 2017-07-17 NOTE — Progress Notes (Signed)
I have read the note, and I agree with the clinical assessment and plan.  [name] [name]   

## 2017-08-02 DIAGNOSIS — Z1231 Encounter for screening mammogram for malignant neoplasm of breast: Secondary | ICD-10-CM | POA: Diagnosis not present

## 2017-08-02 DIAGNOSIS — M797 Fibromyalgia: Secondary | ICD-10-CM | POA: Diagnosis not present

## 2017-08-02 DIAGNOSIS — L989 Disorder of the skin and subcutaneous tissue, unspecified: Secondary | ICD-10-CM | POA: Diagnosis not present

## 2017-08-02 DIAGNOSIS — E6609 Other obesity due to excess calories: Secondary | ICD-10-CM | POA: Diagnosis not present

## 2017-08-02 DIAGNOSIS — Z6830 Body mass index (BMI) 30.0-30.9, adult: Secondary | ICD-10-CM | POA: Diagnosis not present

## 2017-08-02 DIAGNOSIS — N951 Menopausal and female climacteric states: Secondary | ICD-10-CM | POA: Diagnosis not present

## 2017-08-02 DIAGNOSIS — E559 Vitamin D deficiency, unspecified: Secondary | ICD-10-CM | POA: Diagnosis not present

## 2017-08-02 DIAGNOSIS — E039 Hypothyroidism, unspecified: Secondary | ICD-10-CM | POA: Diagnosis not present

## 2017-08-09 ENCOUNTER — Other Ambulatory Visit: Payer: Self-pay | Admitting: Family Medicine

## 2017-08-09 DIAGNOSIS — Z1231 Encounter for screening mammogram for malignant neoplasm of breast: Secondary | ICD-10-CM

## 2017-08-30 ENCOUNTER — Ambulatory Visit: Payer: Medicare Other

## 2017-09-06 DIAGNOSIS — L720 Epidermal cyst: Secondary | ICD-10-CM | POA: Diagnosis not present

## 2017-09-06 DIAGNOSIS — L851 Acquired keratosis [keratoderma] palmaris et plantaris: Secondary | ICD-10-CM | POA: Diagnosis not present

## 2017-09-06 DIAGNOSIS — L570 Actinic keratosis: Secondary | ICD-10-CM | POA: Diagnosis not present

## 2017-09-06 DIAGNOSIS — L814 Other melanin hyperpigmentation: Secondary | ICD-10-CM | POA: Diagnosis not present

## 2017-09-06 DIAGNOSIS — L821 Other seborrheic keratosis: Secondary | ICD-10-CM | POA: Diagnosis not present

## 2017-09-06 DIAGNOSIS — D485 Neoplasm of uncertain behavior of skin: Secondary | ICD-10-CM | POA: Diagnosis not present

## 2017-09-24 ENCOUNTER — Other Ambulatory Visit: Payer: Self-pay | Admitting: Neurology

## 2017-10-08 ENCOUNTER — Other Ambulatory Visit: Payer: Self-pay | Admitting: Hematology and Oncology

## 2017-10-08 DIAGNOSIS — Z85038 Personal history of other malignant neoplasm of large intestine: Secondary | ICD-10-CM

## 2017-10-08 DIAGNOSIS — D472 Monoclonal gammopathy: Secondary | ICD-10-CM

## 2017-10-09 ENCOUNTER — Inpatient Hospital Stay: Payer: Medicare Other | Attending: Hematology and Oncology | Admitting: Hematology and Oncology

## 2017-10-09 ENCOUNTER — Encounter: Payer: Self-pay | Admitting: Hematology and Oncology

## 2017-10-09 ENCOUNTER — Telehealth: Payer: Self-pay | Admitting: Hematology and Oncology

## 2017-10-09 ENCOUNTER — Inpatient Hospital Stay: Payer: Medicare Other

## 2017-10-09 DIAGNOSIS — Z85038 Personal history of other malignant neoplasm of large intestine: Secondary | ICD-10-CM

## 2017-10-09 DIAGNOSIS — D472 Monoclonal gammopathy: Secondary | ICD-10-CM | POA: Diagnosis not present

## 2017-10-09 DIAGNOSIS — Z7982 Long term (current) use of aspirin: Secondary | ICD-10-CM | POA: Diagnosis not present

## 2017-10-09 DIAGNOSIS — Z79899 Other long term (current) drug therapy: Secondary | ICD-10-CM

## 2017-10-09 DIAGNOSIS — C189 Malignant neoplasm of colon, unspecified: Secondary | ICD-10-CM

## 2017-10-09 LAB — CEA (IN HOUSE-CHCC): CEA (CHCC-IN HOUSE): 1.6 ng/mL (ref 0.00–5.00)

## 2017-10-09 LAB — CBC WITH DIFFERENTIAL/PLATELET
BASOS ABS: 0 10*3/uL (ref 0.0–0.1)
BASOS PCT: 0 %
Eosinophils Absolute: 0.2 10*3/uL (ref 0.0–0.5)
Eosinophils Relative: 3 %
HEMATOCRIT: 42.7 % (ref 34.8–46.6)
HEMOGLOBIN: 14.3 g/dL (ref 11.6–15.9)
LYMPHS PCT: 26 %
Lymphs Abs: 1.8 10*3/uL (ref 0.9–3.3)
MCH: 30.5 pg (ref 25.1–34.0)
MCHC: 33.5 g/dL (ref 31.5–36.0)
MCV: 91 fL (ref 79.5–101.0)
MONO ABS: 0.6 10*3/uL (ref 0.1–0.9)
Monocytes Relative: 8 %
NEUTROS ABS: 4.2 10*3/uL (ref 1.5–6.5)
NEUTROS PCT: 63 %
Platelets: 270 10*3/uL (ref 145–400)
RBC: 4.69 MIL/uL (ref 3.70–5.45)
RDW: 12.8 % (ref 11.2–14.5)
WBC: 6.7 10*3/uL (ref 3.9–10.3)

## 2017-10-09 LAB — COMPREHENSIVE METABOLIC PANEL
ALBUMIN: 3.8 g/dL (ref 3.5–5.0)
ALK PHOS: 95 U/L (ref 40–150)
ALT: 13 U/L (ref 0–55)
AST: 12 U/L (ref 5–34)
Anion gap: 7 (ref 3–11)
BILIRUBIN TOTAL: 0.3 mg/dL (ref 0.2–1.2)
BUN: 13 mg/dL (ref 7–26)
CALCIUM: 9.4 mg/dL (ref 8.4–10.4)
CO2: 27 mmol/L (ref 22–29)
CREATININE: 0.8 mg/dL (ref 0.60–1.10)
Chloride: 103 mmol/L (ref 98–109)
GFR calc Af Amer: >60 mL/min (ref >60)
Glucose, Bld: 102 mg/dL (ref 70–140)
Potassium: 4.3 mmol/L (ref 3.5–5.1)
Sodium: 137 mmol/L (ref 136–145)
TOTAL PROTEIN: 7.8 g/dL (ref 6.4–8.3)

## 2017-10-09 NOTE — Telephone Encounter (Signed)
Gave patient AVs and calendar of upcoming April 2020 appointments.  °

## 2017-10-09 NOTE — Assessment & Plan Note (Signed)
The last report that I could find in epic was dated in 2013 According to scan report, she might have a recent colonoscopy I will get her GI physician to fax [place] the latest copy of her colonoscopy report CEA level is pending If her recent colonoscopy is negative, she will need to continue screening colonoscopy every 5 years

## 2017-10-09 NOTE — Assessment & Plan Note (Addendum)
She has no signs of disease progression on blood work, physical examination and imaging study. Myeloma workup is still pending I will call her with results next year For next year, I recommend she return a week before her scheduled appointment to get blood work done ahead of time

## 2017-10-09 NOTE — Progress Notes (Signed)
[place] OFFICE PROGRESS NOTE  Patient Care Team: Harlan Stains, MD as PCP - General (Family Medicine) Wilford Corner, MD as Consulting Physician (Gastroenterology)  ASSESSMENT & PLAN:  History of colon cancer The last report that I could find in epic was dated in 2013 According to scan report, she might have a recent colonoscopy I will get her GI physician to fax [place] the latest copy of her colonoscopy report CEA level is pending If her recent colonoscopy is negative, she will need to continue screening colonoscopy every 5 years  Monoclonal gammopathy of unknown significance She has no signs of disease progression on blood work, physical examination and imaging study. Myeloma workup is still pending I will call her with results next year For next year, I recommend she return a week before her scheduled appointment to get blood work done ahead of time   Orders Placed This Encounter  Procedures  . CEA (IN HOUSE-[facility])  . Comprehensive metabolic panel    Standing Status:   Future    Standing Expiration Date:   11/13/2018  . CBC with Differential/Platelet    Standing Status:   Future    Standing Expiration Date:   11/13/2018  . Kappa/lambda light chains    Standing Status:   Future    Standing Expiration Date:   11/13/2018  . Multiple Myeloma Panel (SPEP&IFE w/QIG)    Standing Status:   Future    Standing Expiration Date:   11/13/2018  . CEA    Standing Status:   Future    Standing Expiration Date:   11/13/2018    INTERVAL HISTORY: Please see below for problem oriented charting. 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       REVIEW OF SYSTEMS:   Constitutional: Denies fevers, chills or abnormal weight loss Eyes: Denies blurriness of vision Ears, nose, mouth, throat, and face: Denies mucositis or sore throat Respiratory: Denies cough, dyspnea or wheezes Cardiovascular: Denies palpitation, chest discomfort or lower extremity swelling Gastrointestinal:  Denies nausea, heartburn or change in bowel habits Skin: Denies abnormal skin rashes Lymphatics: Denies new lymphadenopathy or easy bruising Neurological:Denies numbness, tingling or new weaknesses Behavioral/Psych: Mood is stable, no new changes  All other systems were reviewed with the patient and are negative.  I have reviewed the past medical history, past surgical history, social history and family history with the patient and they are unchanged from previous note.  ALLERGIES:  is allergic to aspirin; erythromycin; lisinopril; niacin and related; statins; sulfa drugs cross reactors; tetracyclines & related; and penicillins.  MEDICATIONS:  Current Outpatient Medications  Medication Sig Dispense Refill  . acetaminophen (TYLENOL) 500 MG tablet Take 500-1,000 mg by mouth daily as needed for mild pain or headache.    Marland Kitchen amLODipine (NORVASC) 10 MG tablet Take 10 mg by mouth daily.     Marland Kitchen aspirin 81 MG chewable tablet Chew 1 tablet (81 mg total) by mouth daily.    . Azelastine HCl 0.15 %  SOLN Place 1 spray into both nostrils daily as needed (seasonal allergies).   12  . carbamazepine (TEGRETOL XR) 100 MG 12 hr tablet TAKE 5 TABLETS TWICE A DAY 900 tablet 3  . carvedilol (COREG) 12.5 MG tablet TAKE 1 TABLET (12.5 MG TOTAL) BY MOUTH 2 (TWO) TIMES DAILY WITH A MEAL. 60 tablet 5  . DULoxetine (CYMBALTA) 60 MG capsule Take 60 mg by mouth daily.     Marland Kitchen estradiol (VIVELLE-DOT) 0.05 MG/24HR patch Place 1 patch onto  the skin 2 (two) times a week. Sunday, Wednesday    . ethosuximide (ZARONTIN) 250 MG capsule Take 2 capsule (560m) in the morning, , and 2 capsules (5062m at night 360 capsule 3  . ezetimibe (ZETIA) 10 MG tablet Take 1 tablet (10 mg total) by mouth daily. 90 tablet 3  . ezetimibe (ZETIA) 10 MG tablet TAKE 1 TABLET DAILY 90 tablet 3  . HYDROcodone-acetaminophen (NORCO/VICODIN) 5-325 MG tablet Take 1 tablet by mouth every 4 (four) hours as needed for moderate pain. 10 tablet 0  . ibuprofen (ADVIL,MOTRIN) 600 MG tablet Take 1 tablet (600 mg total) by mouth every 8 (eight) hours as needed. 15 tablet 0  . isosorbide mononitrate (IMDUR) 30 MG 24 hr tablet Take 1 tablet (30 mg total) by mouth daily. 90 tablet 3  . levothyroxine (SYNTHROID, LEVOTHROID) 50 MCG tablet Take 50 mcg by mouth daily.     . memantine (NAMENDA) 10 MG tablet Take 1 tablet (10 mg total) by mouth 2 (two) times daily. 180 tablet 3  . nitroGLYCERIN (NITROSTAT) 0.4 MG SL tablet Place 1 tablet (0.4 mg total) under the tongue every 5 (five) minutes as needed. For chest pain. (Patient taking differently: Place 0.4 mg under the tongue every 5 (five) minutes as needed for chest pain. ) 25 tablet 6  . rosuvastatin (CRESTOR) 5 MG tablet Take 1 tablet by mouth up to 3 times weekly as directed 12 tablet 3  . tiZANidine (ZANAFLEX) 2 MG tablet Take 2-4 mg by mouth at bedtime.   2   No current facility-administered medications for this visit.     PHYSICAL EXAMINATION: ECOG PERFORMANCE STATUS: 1 - Symptomatic but completely ambulatory  Vitals:   10/09/17 0910  BP: 132/67  Pulse: 86  Resp: 18  Temp: (!) 97.5 F (36.4 C)  SpO2: 100%   Filed Weights   10/09/17 0910  Weight: 194 lb 6.4 oz (88.2 kg)    GENERAL:alert, no distress and comfortable SKIN: skin color, texture, turgor are normal, no rashes or significant lesions EYES: normal, Conjunctiva are pink and non-injected, sclera clear OROPHARYNX:no exudate, no erythema and lips,  buccal mucosa, and tongue normal  NECK: supple, thyroid normal size, non-tender, without nodularity LYMPH:  no palpable lymphadenopathy in the cervical, axillary or inguinal LUNGS: clear to auscultation and percussion with normal breathing effort HEART: regular rate & rhythm and no murmurs and no lower extremity edema ABDOMEN:abdomen soft, non-tender and normal bowel sounds Musculoskeletal:no cyanosis of digits and no clubbing  NEURO: alert & oriented x 3 with fluent speech, no focal motor/sensory deficits  LABORATORY DATA:  I have reviewed the data as listed    Component Value Date/Time   NA 137 07/11/2016 0820   K 4.4 07/11/2016 0820   CL 104 06/30/2016 0607   CL 106 12/25/2012 0924   CO2 27 07/11/2016 0820   GLUCOSE 115 07/11/2016 0820   GLUCOSE 105 (H) 12/25/2012 0924   BUN 15.6 07/11/2016 0820   CREATININE 0.8 07/11/2016 0820  CALCIUM 8.9 07/11/2016 0820   PROT 7.1 07/11/2016 0820   PROT 6.6 07/11/2016 0820   ALBUMIN 3.6 07/11/2016 0820   AST 13 07/11/2016 0820   ALT 14 07/11/2016 0820   ALKPHOS 89 07/11/2016 0820   BILITOT 0.29 07/11/2016 0820   GFRNONAA >60 06/30/2016 0607   GFRAA >60 06/30/2016 0607    No results found for: SPEP, UPEP  Lab Results  Component Value Date   WBC 6.7 10/09/2017   NEUTROABS 4.2 10/09/2017   HGB 14.3 10/09/2017   HCT 42.7 10/09/2017   MCV 91.0 10/09/2017   PLT 270 10/09/2017      Chemistry      Component Value Date/Time   NA 137 07/11/2016 0820   K 4.4 07/11/2016 0820   CL 104 06/30/2016 0607   CL 106 12/25/2012 0924   CO2 27 07/11/2016 0820   BUN 15.6 07/11/2016 0820   CREATININE 0.8 07/11/2016 0820      Component Value Date/Time   CALCIUM 8.9 07/11/2016 0820   ALKPHOS 89 07/11/2016 0820   AST 13 07/11/2016 0820   ALT 14 07/11/2016 0820   BILITOT 0.29 07/11/2016 0820       All questions were answered. The patient knows to call the clinic with any problems, questions or concerns. No barriers to learning was  detected.  I spent 15 minutes counseling the patient face to face. The total time spent in the appointment was 20 minutes and more than 50% was on counseling and review of test results  Heath Lark, MD 10/09/2017 9:38 AM

## 2017-10-10 LAB — KAPPA/LAMBDA LIGHT CHAINS
KAPPA FREE LGHT CHN: 26.6 mg/L — AB (ref 3.3–19.4)
KAPPA, LAMDA LIGHT CHAIN RATIO: 1.73 — AB (ref 0.26–1.65)
LAMDA FREE LIGHT CHAINS: 15.4 mg/L (ref 5.7–26.3)

## 2017-10-11 ENCOUNTER — Telehealth: Payer: Self-pay

## 2017-10-11 LAB — MULTIPLE MYELOMA PANEL, SERUM
ALPHA2 GLOB SERPL ELPH-MCNC: 0.9 g/dL (ref 0.4–1.0)
Albumin SerPl Elph-Mcnc: 3.7 g/dL (ref 2.9–4.4)
Albumin/Glob SerPl: 1.1 (ref 0.7–1.7)
Alpha 1: 0.3 g/dL (ref 0.0–0.4)
B-Globulin SerPl Elph-Mcnc: 1 g/dL (ref 0.7–1.3)
Gamma Glob SerPl Elph-Mcnc: 1.3 g/dL (ref 0.4–1.8)
Globulin, Total: 3.4 g/dL (ref 2.2–3.9)
IGA: 191 mg/dL (ref 87–352)
IGG (IMMUNOGLOBIN G), SERUM: 1309 mg/dL (ref 700–1600)
IGM (IMMUNOGLOBULIN M), SRM: 96 mg/dL (ref 26–217)
M Protein SerPl Elph-Mcnc: 0.7 g/dL — ABNORMAL HIGH
TOTAL PROTEIN ELP: 7.1 g/dL (ref 6.0–8.5)

## 2017-10-11 NOTE — Telephone Encounter (Signed)
-----   Message from Heath Lark, MD sent at 10/11/2017  3:58 PM EDT ----- Regarding: labs Pls let her know her MGUS labs are stable The CEA for colon ca is stable She needs to call her GI doctor to reschedule colonoscopy ----- Message ----- From: Interface, Lab In [place] Sent: 10/09/2017   9:02 AM To: Heath Lark, MD

## 2017-10-12 NOTE — Telephone Encounter (Signed)
Called and given below message. Verbalized understanding. 

## 2017-10-29 ENCOUNTER — Ambulatory Visit (INDEPENDENT_AMBULATORY_CARE_PROVIDER_SITE_OTHER): Payer: Medicare Other | Admitting: Adult Health

## 2017-10-29 ENCOUNTER — Encounter: Payer: Self-pay | Admitting: Adult Health

## 2017-10-29 VITALS — BP 128/70 | HR 88 | Ht 69.0 in | Wt 195.4 lb

## 2017-10-29 DIAGNOSIS — Z5181 Encounter for therapeutic drug level monitoring: Secondary | ICD-10-CM

## 2017-10-29 DIAGNOSIS — R413 Other amnesia: Secondary | ICD-10-CM | POA: Diagnosis not present

## 2017-10-29 DIAGNOSIS — R569 Unspecified convulsions: Secondary | ICD-10-CM | POA: Diagnosis not present

## 2017-10-29 NOTE — Patient Instructions (Addendum)
Your Plan:  Continue Carbamazepine, zarontin for seizures Continue Namenda for memory Blood work today If your symptoms worsen or you develop new symptoms please let us know.   Thank you for coming to see [place] at [facility]. I hope we have been able to provide you high quality care today.  You may receive a patient satisfaction survey over the next few weeks. We would appreciate your feedback and comments so that we may continue to improve ourselves and the health of our patients.

## 2017-10-29 NOTE — Progress Notes (Signed)
I have read the note, and I agree with the clinical assessment and plan.  Charles K Willis   

## 2017-10-29 NOTE — Progress Notes (Signed)
PATIENT: Rachel Clark DOB: 04-17-1951  REASON FOR VISIT: follow up- seizures and memory  HISTORY FROM: patient  HISTORY OF PRESENT ILLNESS: Today 10/29/17  Rachel Clark is a 67 year old female with a history of seizures and memory disturbance.  She returns today for follow-up.  She continues on carbamazepine and Neurontin.  She reports that she tolerates the medication well.  She operates a Teacher, music without difficulty.  Denies any seizure events.  She is able to complete all ADLs independently.  Denies any trouble with her sleep.  She feels that her memory has remained stable.  She states that she continues to perform well at her job.  She remains on Namenda.  She denies any new neurological symptoms.  Returns today for an evaluation.  HISTORY Rachel Clark is a 67 year old right-handed white female with a history of seizures that have been well controlled for 40 years, she has not wanted to come off of her medications.  She has a history of fibromyalgia and chronic fatigue syndrome, she misses work frequently because she cannot function and she will sleep all day long.  The patient believes that she sleeps well at night.  The patient has had some problems with memory and concentration.  She had been placed on Namenda which seems to have helped her ability to focus somewhat.  Her memory remains stable according to the husband who is with her today.  The patient is tolerating the Namenda well.  She returns to this office for an evaluation.  She did have recent blood work, her vitamin D level was low, she has gone on supplementation   REVIEW OF SYSTEMS: Out of a complete 14 system review of symptoms, the patient complains only of the following symptoms, and all other reviewed systems are negative.  See HPI  ALLERGIES: Allergies  Allergen Reactions  . Aspirin Other (See Comments)    GI upset  . Erythromycin Nausea Only    Gi upset  . Lisinopril Cough  . Niacin And Related     Pt  does not recall reactions   . Statins     Muscle pain  . Sulfa Drugs Cross Reactors Swelling  . Tetracyclines & Related Nausea Only  . Penicillins Nausea Only and Rash    Has patient had a PCN reaction causing immediate rash, facial/tongue/throat swelling, SOB or lightheadedness with hypotension: Yes Has patient had a PCN reaction causing severe rash involving mucus membranes or skin necrosis: No Has patient had a PCN reaction that required hospitalization No Has patient had a PCN reaction occurring within the last 10 years: No If all of the above answers are "NO", then may proceed with Cephalosporin use.     HOME MEDICATIONS: Outpatient Medications Prior to Visit  Medication Sig Dispense Refill  . acetaminophen (TYLENOL) 500 MG tablet Take 500-1,000 mg by mouth daily as needed for mild pain or headache.    Marland Kitchen amLODipine (NORVASC) 10 MG tablet Take 10 mg by mouth daily.     Marland Kitchen aspirin 81 MG chewable tablet Chew 1 tablet (81 mg total) by mouth daily.    . Azelastine HCl 0.15 % SOLN Place 1 spray into both nostrils daily as needed (seasonal allergies).   12  . carbamazepine (TEGRETOL XR) 100 MG 12 hr tablet TAKE 5 TABLETS TWICE A DAY 900 tablet 3  . carvedilol (COREG) 12.5 MG tablet TAKE 1 TABLET (12.5 MG TOTAL) BY MOUTH 2 (TWO) TIMES DAILY WITH A MEAL. 60 tablet 5  .  DULoxetine (CYMBALTA) 60 MG capsule Take 60 mg by mouth daily.     Marland Kitchen estradiol (VIVELLE-DOT) 0.05 MG/24HR patch Place 1 patch onto the skin 2 (two) times a week. Sunday, Wednesday    . ethosuximide (ZARONTIN) 250 MG capsule Take 2 capsule (500mg ) in the morning, , and 2 capsules (500mg ) at night 360 capsule 3  . ezetimibe (ZETIA) 10 MG tablet Take 1 tablet (10 mg total) by mouth daily. 90 tablet 3  . ezetimibe (ZETIA) 10 MG tablet TAKE 1 TABLET DAILY 90 tablet 3  . HYDROcodone-acetaminophen (NORCO/VICODIN) 5-325 MG tablet Take 1 tablet by mouth every 4 (four) hours as needed for moderate pain. 10 tablet 0  . ibuprofen  (ADVIL,MOTRIN) 600 MG tablet Take 1 tablet (600 mg total) by mouth every 8 (eight) hours as needed. 15 tablet 0  . isosorbide mononitrate (IMDUR) 30 MG 24 hr tablet Take 1 tablet (30 mg total) by mouth daily. 90 tablet 3  . levothyroxine (SYNTHROID, LEVOTHROID) 50 MCG tablet Take 50 mcg by mouth daily.     . memantine (NAMENDA) 10 MG tablet Take 1 tablet (10 mg total) by mouth 2 (two) times daily. 180 tablet 3  . nitroGLYCERIN (NITROSTAT) 0.4 MG SL tablet Place 1 tablet (0.4 mg total) under the tongue every 5 (five) minutes as needed. For chest pain. (Patient taking differently: Place 0.4 mg under the tongue every 5 (five) minutes as needed for chest pain. ) 25 tablet 6  . rosuvastatin (CRESTOR) 5 MG tablet Take 1 tablet by mouth up to 3 times weekly as directed 12 tablet 3  . tiZANidine (ZANAFLEX) 2 MG tablet Take 2-4 mg by mouth at bedtime.   2   No facility-administered medications prior to visit.     PAST MEDICAL HISTORY: Past Medical History:  Diagnosis Date  . Arthritis    "fingers" (06/29/2016)  . Chronic lower back pain   . Colon cancer ([place]) 12/04/2011   s/p Laparoscopic-assisted transverse colectomy on 12/19/2011 by Dr. Donne Hazel.  pT3 N0 M0.   . Coronary artery disease    a. remote NSTEMI with PCI to the RCA; b. S/P CABG in 2007; c. 2010 - 2 v CAD with L-LAD and S-RCA patent, small caliber Dx 70% (treated medically - no amenable to PCI), CFX free of significant disease, normal LVF. d. 10/2014 Lexi MV: EF 61%, no ischemia/infarct.  . Depression   . Dyslipidemia   . Fibromyalgia   . Gastric ulcer   . GERD (gastroesophageal reflux disease)   . 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 Headache    "weekly" (06/29/2016)  . Hyperlipidemia   . Hypothyroid   . Iron deficiency anemia 11/17/2011  . Memory change 11/13/2016  . Monoclonal gammopathy of unknown significance   . Myocardial infarction ([place]) 2007  . Presence  of permanent cardiac pacemaker   . Syncope and collapse    pacemaker implanted  . Unstable angina ([place])     PAST SURGICAL HISTORY: Past Surgical History:  Procedure Laterality Date  . ANTERIOR CERVICAL DECOMP/DISCECTOMY FUSION  1980's  . BACK SURGERY    . CARDIAC CATHETERIZATION  05/20/2009   obstructive native vessel disease in LAD, RCA, and first diagonal, patent vein graft to distal RCA and LIMA to LAD,normal. ef 60%  . CARDIAC CATHETERIZATION N/A 03/24/2015   Procedure: Left Heart Cath and Cors/Grafts Angiography;  Surgeon: Wellington Hampshire, MD;  Location: [place] [facility];  Service: Cardiovascular;  Laterality: N/A;  . CARDIAC CATHETERIZATION N/A 07/28/2015   Procedure: Left Heart Cath and Coronary Angiography;  Surgeon: Troy Sine, MD;  Location: [place] [facility];  Service: Cardiovascular;  Laterality: N/A;  . CARDIAC CATHETERIZATION N/A 11/09/2015   Procedure: Coronary Stent Intervention;  Surgeon: Sherren Mocha, MD;  Location: [place] [facility];  Service: Cardiovascular;  Laterality: N/A;  . CARDIAC CATHETERIZATION N/A 11/09/2015   Procedure: Left Heart Cath and Coronary Angiography;  Surgeon: Sherren Mocha, MD;  Location: [place] [facility];  Service: Cardiovascular;  Laterality: N/A;  . CARDIAC CATHETERIZATION N/A 06/29/2016   Procedure: Left Heart Cath and Coronary Angiography;  Surgeon: Nelva Bush, MD;  Location: [place] [facility];  Service: Cardiovascular;  Laterality: N/A;  . CARDIAC CATHETERIZATION N/A 06/29/2016   Procedure: Intravascular Pressure Wire/FFR Study;  Surgeon: Nelva Bush, MD;  Location: [place] [facility];  Service: Cardiovascular;  Laterality: N/A;  . CARDIAC CATHETERIZATION N/A 06/29/2016   Procedure: Coronary Balloon Angioplasty;  Surgeon: Nelva Bush, MD;  Location: [place] [facility];  Service: Cardiovascular;  Laterality: N/A;  . COLON RESECTION  12/19/2011   Procedure: COLON RESECTION LAPAROSCOPIC;  Surgeon: Rolm Bookbinder, MD;   Location: [place];  Service: General;  Laterality: N/A;  laparoscopic hand assisted partial colon resection  . COLON SURGERY    . COLONOSCOPY    . CORONARY ANGIOPLASTY WITH STENT PLACEMENT  ~ 2007   1 stent  . CORONARY ARTERY BYPASS GRAFT  2007   "CABG X2"  . DILATION AND CURETTAGE OF UTERUS  1973  . EP IMPLANTABLE DEVICE N/A 03/25/2015   MDT Advisa DR pacemaker implanted by Dr Rayann Heman for transient complete heart block and syncope  . INSERT / REPLACE / REMOVE PACEMAKER    . PORT-A-CATH REMOVAL N/A 06/12/2013   Procedure: REMOVAL PORT-A-CATH;  Surgeon: Rolm Bookbinder, MD;  Location: [place];  Service: General;  Laterality: N/A;  . PORTACATH PLACEMENT  06/04/2012   Procedure: INSERTION PORT-A-CATH;  Surgeon: Rolm Bookbinder, MD;  Location: [place];  Service: General;  Laterality: N/A;  Insertion of port-a-cath   . POSTERIOR LAMINECTOMY / DECOMPRESSION CERVICAL SPINE  1990s  . VAGINAL HYSTERECTOMY      FAMILY HISTORY: Family History  Problem Relation Age of Onset  . Heart attack Father   . Heart disease Father   . Heart attack Brother 76  . Cancer Mother        lung  . Stroke Neg Hx     SOCIAL HISTORY: Social History   Socioeconomic History  . Marital status: Married    Spouse name: Not on file  . Number of children: 2  . Years of education: 28  . Highest education level: Not on file  Occupational History  . 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 Financial resource strain: Not on file  . Food insecurity:    Worry: Not on file    Inability: Not on file  . Transportation needs:    Medical: Not on file    Non-medical: Not on file  Tobacco Use  . Smoking status: Former Smoker    Packs/day: 0.30    Years: 20.00    Pack years: 6.00    Types: Cigarettes    Last attempt to quit: 06/06/1981    Years since quitting: 36.4  . Smokeless tobacco: Never Used  Substance and Sexual Activity  .  Alcohol use: Yes    Alcohol/week: 0.6 oz    Types: 1 Glasses of wine per week  . Drug use: No  . Sexual activity: Not Currently    Birth control/protection: Surgical  Lifestyle  . Physical activity:    Days per week: Not on file    Minutes per session: Not on file  . Stress: Not on file  Relationships  . Social connections:    Talks on phone: Not on file    Gets together: Not on file    Attends religious service: Not on file    Active member of club or organization: Not on file    Attends meetings of clubs or organizations: Not on file    Relationship status: Not on file  . Intimate partner violence:    Fear of current or ex partner: Not on file    Emotionally abused: Not on file    Physically abused: Not on file    Forced sexual activity: Not on file  Other Topics Concern  . Not on file  Social History Narrative   Patient is married with 2 children.   Patient is right handed.   Patient has a high school education with some college education.   Patient drinks 2 cups daily.      PHYSICAL EXAM  Vitals:   10/29/17 0842  BP: 128/70  Pulse: 88  Weight: 195 lb 6.4 oz (88.6 kg)  Height: 5\' 9"  (1.753 m)   Body mass index is 28.86 kg/m.  MMSE - Mini Mental State Exam 10/29/2017 11/13/2016  Orientation to time 3 3  Orientation to Place 5 5  Registration 3 3  Attention/ Calculation 4 3  Recall 2 3  Language- name 2 objects 2 2  Language- repeat 1 1  Language- follow 3 step command 3 3  Language- read & follow direction 1 1  Write a sentence 1 1  Copy design 1 1  Total score 26 26    Generalized: Well developed, in no acute distress   Neurological examination  Mentation: Alert oriented to time, place, history taking. Follows all commands speech and language fluent Cranial nerve II-XII: Pupils were equal round reactive to light. Extraocular movements were full, visual field were full on confrontational test. Facial sensation and strength were normal. Uvula tongue  midline. Head turning and shoulder shrug  were normal and symmetric. Motor: The motor testing reveals 5 over 5 strength of all 4 extremities. Good symmetric motor tone is noted throughout.  Sensory: Sensory testing is intact to soft touch on all 4 extremities. No evidence of extinction is noted.  Coordination: Cerebellar testing reveals good finger-nose-finger and heel-to-shin bilaterally.  Gait and station: Gait is normal. Tandem gait is normal. Romberg is negative. No drift is seen.  Reflexes: Deep tendon reflexes are symmetric and normal bilaterally.   DIAGNOSTIC DATA (LABS, IMAGING, TESTING) - I reviewed patient records, labs, notes, testing and imaging myself where available.  Lab Results  Component Value Date   WBC 6.7 10/09/2017   HGB 14.3 10/09/2017   HCT 42.7 10/09/2017   MCV 91.0 10/09/2017   PLT 270 10/09/2017      Component Value Date/Time   NA 137 10/09/2017 0848   NA 137 07/11/2016 0820   K 4.3 10/09/2017 0848   K 4.4 07/11/2016 0820   CL 103 10/09/2017 0848   CL 106 12/25/2012 0924   CO2 27 10/09/2017 0848   CO2 27 07/11/2016 0820   GLUCOSE 102 10/09/2017 0848  GLUCOSE 115 07/11/2016 0820   GLUCOSE 105 (H) 12/25/2012 0924   BUN 13 10/09/2017 0848   BUN 15.6 07/11/2016 0820   CREATININE 0.80 10/09/2017 0848   CREATININE 0.8 07/11/2016 0820   CALCIUM 9.4 10/09/2017 0848   CALCIUM 8.9 07/11/2016 0820   PROT 7.8 10/09/2017 0848   PROT 7.1 07/11/2016 0820   PROT 6.6 07/11/2016 0820   ALBUMIN 3.8 10/09/2017 0848   ALBUMIN 3.6 07/11/2016 0820   AST 12 10/09/2017 0848   AST 13 07/11/2016 0820   ALT 13 10/09/2017 0848   ALT 14 07/11/2016 0820   ALKPHOS 95 10/09/2017 0848   ALKPHOS 89 07/11/2016 0820   BILITOT 0.3 10/09/2017 0848   BILITOT 0.29 07/11/2016 0820   GFRNONAA >60 10/09/2017 0848   GFRAA >60 10/09/2017 0848   Lab Results  Component Value Date   CHOL 249 (H) 07/07/2016   HDL 85 07/07/2016   LDLCALC 137 (H) 07/07/2016   TRIG 134 07/07/2016    CHOLHDL 2.9 07/07/2016   Lab Results  Component Value Date   HGBA1C 5.9 (H) 03/24/2015   Lab Results  Component Value Date   VITAMINB12 290 11/13/2016   Lab Results  Component Value Date   TSH 0.496 03/24/2015      ASSESSMENT AND PLAN 67 y.o. 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 here with:  1.  Seizures 2.  Memory disturbance  Overall the patient is doing well.  She will continue on carbamazepine and Zarontin.  I will check blood work today.  The patient's memory score has remained stable.  She will continue on Namenda.  She is advised that if her symptoms worsen or she develops new symptoms she should let us know.  She will follow-up in 6 months or sooner if needed.   Ward Givens, MSN, NP-C 10/29/2017, 8:57 AM [facility] [address] [place], [place] [address] [phone]

## 2017-10-30 LAB — COMPREHENSIVE METABOLIC PANEL
ALT: 16 IU/L (ref 0–32)
AST: 15 IU/L (ref 0–40)
Albumin/Globulin Ratio: 1.3 (ref 1.2–2.2)
Albumin: 4 g/dL (ref 3.6–4.8)
Alkaline Phosphatase: 117 IU/L (ref 39–117)
BUN/Creatinine Ratio: 22 (ref 12–28)
BUN: 15 mg/dL (ref 8–27)
Bilirubin Total: <0.2 mg/dL (ref 0.0–1.2)
CALCIUM: 9 mg/dL (ref 8.7–10.3)
CO2: 23 mmol/L (ref 20–29)
CREATININE: 0.68 mg/dL (ref 0.57–1.00)
Chloride: 103 mmol/L (ref 96–106)
GFR calc Af Amer: 104 mL/min/{1.73_m2} (ref >59)
GFR, EST NON AFRICAN AMERICAN: 90 mL/min/{1.73_m2} (ref >59)
GLUCOSE: 99 mg/dL (ref 65–99)
Globulin, Total: 3 g/dL (ref 1.5–4.5)
POTASSIUM: 4.8 mmol/L (ref 3.5–5.2)
Sodium: 140 mmol/L (ref 134–144)
Total Protein: 7 g/dL (ref 6.0–8.5)

## 2017-10-30 LAB — CBC WITH DIFFERENTIAL/PLATELET
BASOS: 0 %
Basophils Absolute: 0 10*3/uL (ref 0.0–0.2)
EOS (ABSOLUTE): 0.3 10*3/uL (ref 0.0–0.4)
EOS: 4 %
HEMOGLOBIN: 14 g/dL (ref 11.1–15.9)
Hematocrit: 41.7 % (ref 34.0–46.6)
IMMATURE GRANS (ABS): 0 10*3/uL (ref 0.0–0.1)
IMMATURE GRANULOCYTES: 0 %
LYMPHS: 26 %
Lymphocytes Absolute: 1.8 10*3/uL (ref 0.7–3.1)
MCH: 29.9 pg (ref 26.6–33.0)
MCHC: 33.6 g/dL (ref 31.5–35.7)
MCV: 89 fL (ref 79–97)
MONOCYTES: 8 %
Monocytes Absolute: 0.6 10*3/uL (ref 0.1–0.9)
NEUTROS PCT: 62 %
Neutrophils Absolute: 4.2 10*3/uL (ref 1.4–7.0)
PLATELETS: 312 10*3/uL (ref 150–379)
RBC: 4.68 x10E6/uL (ref 3.77–5.28)
RDW: 13.3 % (ref 12.3–15.4)
WBC: 6.9 10*3/uL (ref 3.4–10.8)

## 2017-10-30 LAB — CARBAMAZEPINE LEVEL, TOTAL: Carbamazepine (Tegretol), S: 10.4 ug/mL (ref 4.0–12.0)

## 2017-10-31 ENCOUNTER — Telehealth: Payer: Self-pay | Admitting: *Deleted

## 2017-10-31 NOTE — Telephone Encounter (Signed)
-----   Message from Kathrynn Ducking, MD sent at 10/31/2017 12:39 PM EDT ----- Blood work is unremarkable, carbamazepine is in the therapeutic range, no change in dosing.  Please call the patient. ----- Message ----- From: Brandon Melnick, RN Sent: 10/31/2017  12:32 PM To: Kathrynn Ducking, MD    ----- Message ----- From: Interface, [facility] Lab Results In Sent: 10/30/2017   7:40 AM To: Ward Givens, NP

## 2017-10-31 NOTE — Telephone Encounter (Signed)
Called and spoke w/ pt about unremarkable labs, carbamazepine level in therapeutic range, no change in dosing. She verbalized understanding and appreciation for call.

## 2017-11-06 ENCOUNTER — Other Ambulatory Visit: Payer: Self-pay | Admitting: Neurology

## 2017-11-15 ENCOUNTER — Telehealth: Payer: Self-pay

## 2017-11-15 NOTE — Telephone Encounter (Signed)
Returned pt's call regarding what steps she should make in order to get a colonoscopy.  Pt had her last colonoscopy with Dr Michail Sermon but she doesn't remember when that was.  Pt said Dr Alvy Bimler wants her to get one done but pt unsure if she is to call Dr Kathline Magic office or was she waiting for a call from someone scheduling it for her. Told her I would follow up with Dr Alvy Bimler and give her a call back.

## 2017-11-16 ENCOUNTER — Telehealth: Payer: Self-pay

## 2017-11-16 NOTE — Telephone Encounter (Signed)
Per Dr Alvy Bimler:  I do not have her latest colonoscopy report I suggest she contact Dr. Kathline Magic office directly and his staff should be able to review with her in terms of appointment. She does not need a new referral since she is an established patient there  Outgoing call to patient, no answer, left her VM to call Dr Kathline Magic office at [phone] to make an appt and that she doesn't need a referral because she is an established pt there.  Left our number as well, for her to call me and leave VM that she has received my message.

## 2017-11-16 NOTE — Telephone Encounter (Signed)
I do not have her latest colonoscopy report I suggest she contact Dr. Kathline Magic office directly and his staff should be able to review with her in terms of appointment. She does not need a new referral since she is an established patient there

## 2017-11-20 DIAGNOSIS — Z1231 Encounter for screening mammogram for malignant neoplasm of breast: Secondary | ICD-10-CM | POA: Diagnosis not present

## 2017-12-17 DIAGNOSIS — N6001 Solitary cyst of right breast: Secondary | ICD-10-CM | POA: Diagnosis not present

## 2018-01-16 ENCOUNTER — Telehealth: Payer: Self-pay | Admitting: Adult Health

## 2018-01-16 DIAGNOSIS — Z79899 Other long term (current) drug therapy: Secondary | ICD-10-CM

## 2018-01-16 DIAGNOSIS — G40909 Epilepsy, unspecified, not intractable, without status epilepticus: Secondary | ICD-10-CM

## 2018-01-16 MED ORDER — CARBAMAZEPINE ER 100 MG PO TB12: ORAL_TABLET | ORAL | 3 refills | Status: DC

## 2018-01-16 NOTE — Telephone Encounter (Signed)
Pt has called asking that  [place], [place] [phone] (Phone) [phone] (Fax)     Be contacted for a refill of carbamazepine (TEGRETOL XR) 100 MG 12 hr tablet Pt states there is not a pharmacy in [place] that has this in [place].

## 2018-01-16 NOTE — Addendum Note (Signed)
Addended by: Desmond Lope on: 01/16/2018 03:37 PM   Modules accepted: Orders

## 2018-01-16 NOTE — Telephone Encounter (Signed)
Refills sent to [facility].

## 2018-01-17 ENCOUNTER — Encounter: Payer: Self-pay | Admitting: Cardiology

## 2018-01-17 ENCOUNTER — Other Ambulatory Visit: Payer: Self-pay | Admitting: *Deleted

## 2018-01-17 MED ORDER — CARBAMAZEPINE ER 200 MG PO TB12
ORAL_TABLET | ORAL | 0 refills | Status: DC
Start: 2018-01-17 — End: 2018-01-18

## 2018-01-17 MED ORDER — CARBAMAZEPINE ER 200 MG PO TB12: ORAL_TABLET | ORAL | 0 refills | Status: DC

## 2018-01-17 NOTE — Telephone Encounter (Addendum)
I have spoken with the patient.  She is agreeable to the medication increase and will come to our office in 3-4 weeks for her labs to be checked.  She is aware that [facility] and [facility] to [facility].  Original prescriptions sent incorrectly but were immediately followed by the correct prescriptions.  I spoke to Adonis Huguenin at [place] [phone]) and Lonn Georgia at [place] [phone]) to void all previously sent prescriptions and confirm the correct one is on file.

## 2018-01-17 NOTE — Telephone Encounter (Signed)
Left message requesting a return call.

## 2018-01-17 NOTE — Addendum Note (Signed)
Addended by: Desmond Lope on: 01/17/2018 03:33 PM   Modules accepted: Orders

## 2018-01-17 NOTE — Telephone Encounter (Signed)
Pt has called back to inform that re: her carbamazepine (TEGRETOL XR) 100 MG 12 hr tablet she cancelled the order with [facility] And would like  A new [facility]  [id number] [address], [place] [phone] (Phone) [phone] (Fax)     For 200mg  since [facility] no [facility].  Pt was asked if she checked with [facility] to see if they were able to get the 100 mg.  Pt said they do have the 100 mg but she would have to wait about 8 days and she will run out of medication while waiting. Pt is asking to be called

## 2018-01-17 NOTE — Addendum Note (Signed)
Addended by: Desmond Lope on: 01/17/2018 03:38 PM   Modules accepted: Orders

## 2018-01-17 NOTE — Addendum Note (Signed)
Addended by: Noberto Retort C on: 01/17/2018 02:11 PM   Modules accepted: Orders

## 2018-01-17 NOTE — Telephone Encounter (Signed)
Per vo by Ward Givens, NP, provide patient with a new prescription for the increased dose of carbamazepine (Tegretol XR) 100mg  12hr tablet, taking 6 tablets BID.  Jinny Blossom has also requested the patient to come to our office to have a carbamazepine lab drawn in 3-4 weeks to check her level.  She [facility] so she will not run out of medication prior to receiving her mail order supply.  A separate mail order prescription for 90 days x 0 will be sent to [facility].  We can add refills once we confirm she is doing well on the medication via labs.

## 2018-01-18 ENCOUNTER — Encounter: Payer: Self-pay | Admitting: *Deleted

## 2018-01-18 NOTE — Telephone Encounter (Addendum)
I did a follow up call to both [facility] and [facility] to make sure the only prescriptions they have active for carbamazepine reflected the patient's correct dose.  Both pharmacies only have the correct, current dose for the patient: Carbamazepine 200 12hr tab, taking three tablets twice daily.  While on the phone with [facility] (speaking with Rachel Clark), she did a benefit's investigation for insurance coverage.  For some reason, the patient's plan will not actually allow her to get a 90-day supply of carbamazepine.  She can only get a 30-day supply and it is nearly $20 less expensive for her to get filled [facility] the local [facility].  At this time, all carbamazepine prescriptions on file at [place] have been voided.  The patient only [facility] [address] with no refills.  She will come in for labs (to check her carbamazepine level) in three weeks and refills can be supplied to the local pharmacy once Rachel Clark has reviewed her lab results.  I have called the patient to review the detailed information above.  I provided her with my name and the office phone number if she should have any further questions.

## 2018-01-23 ENCOUNTER — Other Ambulatory Visit: Payer: Self-pay | Admitting: Cardiology

## 2018-01-23 NOTE — Telephone Encounter (Signed)
Rx sent to pharmacy   

## 2018-02-09 ENCOUNTER — Other Ambulatory Visit: Payer: Self-pay | Admitting: Adult Health

## 2018-02-11 NOTE — Telephone Encounter (Signed)
Refill for Tegretol XR 200 mg 3 tablets twice per day [facility] [address]. MB RN.

## 2018-02-20 ENCOUNTER — Emergency Department (HOSPITAL_COMMUNITY): Payer: Medicare Other

## 2018-02-20 ENCOUNTER — Encounter (HOSPITAL_COMMUNITY): Payer: Self-pay | Admitting: *Deleted

## 2018-02-20 ENCOUNTER — Inpatient Hospital Stay (HOSPITAL_COMMUNITY)
Admission: EM | Admit: 2018-02-20 | Discharge: 2018-02-22 | DRG: 287 | Disposition: A | Discharge status: Home / Self Care | Payer: Medicare Other | Attending: Physician Assistant | Admitting: Physician Assistant

## 2018-02-20 ENCOUNTER — Other Ambulatory Visit: Payer: Self-pay

## 2018-02-20 DIAGNOSIS — E7801 Familial hypercholesterolemia: Secondary | ICD-10-CM

## 2018-02-20 DIAGNOSIS — D509 Iron deficiency anemia, unspecified: Secondary | ICD-10-CM | POA: Diagnosis present

## 2018-02-20 DIAGNOSIS — T45525A Adverse effect of antithrombotic drugs, initial encounter: Secondary | ICD-10-CM | POA: Diagnosis present

## 2018-02-20 DIAGNOSIS — I200 Unstable angina: Secondary | ICD-10-CM | POA: Diagnosis present

## 2018-02-20 DIAGNOSIS — Z90710 Acquired absence of both cervix and uterus: Secondary | ICD-10-CM

## 2018-02-20 DIAGNOSIS — Z882 Allergy status to sulfonamides status: Secondary | ICD-10-CM

## 2018-02-20 DIAGNOSIS — E039 Hypothyroidism, unspecified: Secondary | ICD-10-CM | POA: Diagnosis present

## 2018-02-20 DIAGNOSIS — I252 Old myocardial infarction: Secondary | ICD-10-CM | POA: Diagnosis not present

## 2018-02-20 DIAGNOSIS — Z955 Presence of coronary angioplasty implant and graft: Secondary | ICD-10-CM | POA: Diagnosis not present

## 2018-02-20 DIAGNOSIS — I25110 Atherosclerotic heart disease of native coronary artery with unstable angina pectoris: Secondary | ICD-10-CM

## 2018-02-20 DIAGNOSIS — Z79899 Other long term (current) drug therapy: Secondary | ICD-10-CM

## 2018-02-20 DIAGNOSIS — Z85038 Personal history of other malignant neoplasm of large intestine: Secondary | ICD-10-CM | POA: Diagnosis not present

## 2018-02-20 DIAGNOSIS — X58XXXA Exposure to other specified factors, initial encounter: Secondary | ICD-10-CM | POA: Diagnosis present

## 2018-02-20 DIAGNOSIS — F329 Major depressive disorder, single episode, unspecified: Secondary | ICD-10-CM | POA: Diagnosis present

## 2018-02-20 DIAGNOSIS — I25700 Atherosclerosis of coronary artery bypass graft(s), unspecified, with unstable angina pectoris: Secondary | ICD-10-CM | POA: Diagnosis present

## 2018-02-20 DIAGNOSIS — K219 Gastro-esophageal reflux disease without esophagitis: Secondary | ICD-10-CM | POA: Diagnosis present

## 2018-02-20 DIAGNOSIS — Z886 Allergy status to analgesic agent status: Secondary | ICD-10-CM | POA: Diagnosis not present

## 2018-02-20 DIAGNOSIS — M797 Fibromyalgia: Secondary | ICD-10-CM | POA: Diagnosis present

## 2018-02-20 DIAGNOSIS — Z881 Allergy status to other antibiotic agents status: Secondary | ICD-10-CM

## 2018-02-20 DIAGNOSIS — Z791 Long term (current) use of non-steroidal anti-inflammatories (NSAID): Secondary | ICD-10-CM

## 2018-02-20 DIAGNOSIS — Z8711 Personal history of peptic ulcer disease: Secondary | ICD-10-CM | POA: Diagnosis not present

## 2018-02-20 DIAGNOSIS — E7800 Pure hypercholesterolemia, unspecified: Secondary | ICD-10-CM | POA: Diagnosis not present

## 2018-02-20 DIAGNOSIS — I10 Essential (primary) hypertension: Secondary | ICD-10-CM | POA: Diagnosis not present

## 2018-02-20 DIAGNOSIS — Z79890 Hormone replacement therapy (postmenopausal): Secondary | ICD-10-CM

## 2018-02-20 DIAGNOSIS — Z950 Presence of cardiac pacemaker: Secondary | ICD-10-CM

## 2018-02-20 DIAGNOSIS — E785 Hyperlipidemia, unspecified: Secondary | ICD-10-CM | POA: Diagnosis present

## 2018-02-20 DIAGNOSIS — L270 Generalized skin eruption due to drugs and medicaments taken internally: Secondary | ICD-10-CM | POA: Diagnosis present

## 2018-02-20 DIAGNOSIS — I2582 Chronic total occlusion of coronary artery: Secondary | ICD-10-CM | POA: Diagnosis present

## 2018-02-20 DIAGNOSIS — R569 Unspecified convulsions: Secondary | ICD-10-CM | POA: Diagnosis present

## 2018-02-20 DIAGNOSIS — Z8249 Family history of ischemic heart disease and other diseases of the circulatory system: Secondary | ICD-10-CM

## 2018-02-20 DIAGNOSIS — Z981 Arthrodesis status: Secondary | ICD-10-CM

## 2018-02-20 DIAGNOSIS — Z888 Allergy status to other drugs, medicaments and biological substances status: Secondary | ICD-10-CM

## 2018-02-20 DIAGNOSIS — Z951 Presence of aortocoronary bypass graft: Secondary | ICD-10-CM | POA: Diagnosis not present

## 2018-02-20 DIAGNOSIS — D649 Anemia, unspecified: Secondary | ICD-10-CM | POA: Diagnosis present

## 2018-02-20 DIAGNOSIS — Z880 Allergy status to penicillin: Secondary | ICD-10-CM | POA: Diagnosis not present

## 2018-02-20 DIAGNOSIS — F419 Anxiety disorder, unspecified: Secondary | ICD-10-CM | POA: Diagnosis present

## 2018-02-20 DIAGNOSIS — R079 Chest pain, unspecified: Secondary | ICD-10-CM | POA: Diagnosis not present

## 2018-02-20 DIAGNOSIS — Z7982 Long term (current) use of aspirin: Secondary | ICD-10-CM

## 2018-02-20 DIAGNOSIS — Z87891 Personal history of nicotine dependence: Secondary | ICD-10-CM

## 2018-02-20 DIAGNOSIS — G40409 Other generalized epilepsy and epileptic syndromes, not intractable, without status epilepticus: Secondary | ICD-10-CM | POA: Diagnosis present

## 2018-02-20 LAB — HEPATIC FUNCTION PANEL
ALK PHOS: 72 U/L (ref 38–126)
ALT: 17 U/L (ref 0–44)
AST: 15 U/L (ref 15–41)
Albumin: 3.4 g/dL — ABNORMAL LOW (ref 3.5–5.0)
Bilirubin, Direct: <0.1 mg/dL (ref 0.0–0.2)
TOTAL PROTEIN: 6.6 g/dL (ref 6.5–8.1)
Total Bilirubin: 0.6 mg/dL (ref 0.3–1.2)

## 2018-02-20 LAB — BASIC METABOLIC PANEL
Anion gap: 9 (ref 5–15)
BUN: 14 mg/dL (ref 8–23)
CHLORIDE: 102 mmol/L (ref 98–111)
CO2: 26 mmol/L (ref 22–32)
Calcium: 9 mg/dL (ref 8.9–10.3)
Creatinine, Ser: 0.74 mg/dL (ref 0.44–1.00)
GFR calc Af Amer: >60 mL/min (ref >60)
GFR calc non Af Amer: >60 mL/min (ref >60)
GLUCOSE: 116 mg/dL — AB (ref 70–99)
POTASSIUM: 4.2 mmol/L (ref 3.5–5.1)
Sodium: 137 mmol/L (ref 135–145)

## 2018-02-20 LAB — HEMOGLOBIN A1C
HEMOGLOBIN A1C: 5.5 % (ref 4.8–5.6)
Mean Plasma Glucose: 111.15 mg/dL

## 2018-02-20 LAB — CBC
HEMATOCRIT: 43.5 % (ref 36.0–46.0)
Hemoglobin: 14 g/dL (ref 12.0–15.0)
MCH: 29.5 pg (ref 26.0–34.0)
MCHC: 32.2 g/dL (ref 30.0–36.0)
MCV: 91.6 fL (ref 78.0–100.0)
Platelets: 296 10*3/uL (ref 150–400)
RBC: 4.75 MIL/uL (ref 3.87–5.11)
RDW: 12.6 % (ref 11.5–15.5)
WBC: 9.1 10*3/uL (ref 4.0–10.5)

## 2018-02-20 LAB — TSH: TSH: 1.206 u[IU]/mL (ref 0.350–4.500)

## 2018-02-20 LAB — TROPONIN I

## 2018-02-20 LAB — MAGNESIUM: Magnesium: 2 mg/dL (ref 1.7–2.4)

## 2018-02-20 LAB — I-STAT TROPONIN, ED: Troponin i, poc: 0 ng/mL (ref 0.00–0.08)

## 2018-02-20 LAB — T4, FREE: Free T4: 0.75 ng/dL — ABNORMAL LOW (ref 0.82–1.77)

## 2018-02-20 MED ORDER — SODIUM CHLORIDE 0.9 % IV SOLN: INTRAVENOUS | Status: DC

## 2018-02-20 MED ORDER — ASPIRIN EC 81 MG PO TBEC
81.0000 mg | DELAYED_RELEASE_TABLET | Freq: Every day | ORAL | Status: DC
  Administered 2018-02-21: 81 mg via ORAL
  Filled 2018-02-20: qty 1

## 2018-02-20 MED ORDER — ASPIRIN 81 MG PO CHEW
324.0000 mg | CHEWABLE_TABLET | Freq: Once | ORAL | Status: AC
  Administered 2018-02-20: 324 mg via ORAL
  Filled 2018-02-20: qty 4

## 2018-02-20 MED ORDER — HEPARIN BOLUS VIA INFUSION
4000.0000 [IU] | Freq: Once | INTRAVENOUS | Status: AC
  Administered 2018-02-20: 4000 [IU] via INTRAVENOUS
  Filled 2018-02-20: qty 4000

## 2018-02-20 MED ORDER — SODIUM CHLORIDE 0.9 % WEIGHT BASED INFUSION
1.0000 mL/kg/h | INTRAVENOUS | Status: DC
  Administered 2018-02-21 (×2): 1 mL/kg/h via INTRAVENOUS

## 2018-02-20 MED ORDER — SODIUM CHLORIDE 0.9% FLUSH: 3.0000 mL | INTRAVENOUS | Status: DC | PRN

## 2018-02-20 MED ORDER — SODIUM CHLORIDE 0.9 % IV SOLN: 250.0000 mL | INTRAVENOUS | Status: DC | PRN

## 2018-02-20 MED ORDER — NITROGLYCERIN 0.4 MG SL SUBL: 0.4000 mg | SUBLINGUAL_TABLET | SUBLINGUAL | Status: DC | PRN

## 2018-02-20 MED ORDER — CARVEDILOL 12.5 MG PO TABS
12.5000 mg | ORAL_TABLET | Freq: Two times a day (BID) | ORAL | Status: DC
  Administered 2018-02-21 – 2018-02-22 (×3): 12.5 mg via ORAL
  Filled 2018-02-20 (×3): qty 1

## 2018-02-20 MED ORDER — HEPARIN (PORCINE) IN NACL 100-0.45 UNIT/ML-% IJ SOLN
1200.0000 [IU]/h | INTRAMUSCULAR | Status: DC
  Administered 2018-02-20: 1000 [IU]/h via INTRAVENOUS
  Administered 2018-02-21: 1200 [IU]/h via INTRAVENOUS
  Filled 2018-02-20 (×3): qty 250

## 2018-02-20 MED ORDER — ACETAMINOPHEN 325 MG PO TABS: 650.0000 mg | ORAL_TABLET | Freq: Once | ORAL | Status: DC

## 2018-02-20 MED ORDER — ONDANSETRON HCL 4 MG/2ML IJ SOLN: 4.0000 mg | Freq: Four times a day (QID) | INTRAMUSCULAR | Status: DC | PRN

## 2018-02-20 MED ORDER — ETHOSUXIMIDE 250 MG PO CAPS
500.0000 mg | ORAL_CAPSULE | Freq: Two times a day (BID) | ORAL | Status: DC
  Administered 2018-02-20 – 2018-02-22 (×4): 500 mg via ORAL
  Filled 2018-02-20 (×4): qty 2

## 2018-02-20 MED ORDER — SODIUM CHLORIDE 0.9 % WEIGHT BASED INFUSION: 3.0000 mL/kg/h | INTRAVENOUS | Status: DC

## 2018-02-20 MED ORDER — NITROGLYCERIN IN D5W 200-5 MCG/ML-% IV SOLN
0.0000 ug/min | INTRAVENOUS | Status: DC
  Administered 2018-02-20: 5 ug/min via INTRAVENOUS
  Filled 2018-02-20: qty 250

## 2018-02-20 MED ORDER — EZETIMIBE 10 MG PO TABS
10.0000 mg | ORAL_TABLET | Freq: Every day | ORAL | Status: DC
  Administered 2018-02-20 – 2018-02-22 (×3): 10 mg via ORAL
  Filled 2018-02-20 (×3): qty 1

## 2018-02-20 MED ORDER — ASPIRIN 81 MG PO CHEW: 324.0000 mg | CHEWABLE_TABLET | ORAL | Status: AC

## 2018-02-20 MED ORDER — DULOXETINE HCL 60 MG PO CPEP
60.0000 mg | ORAL_CAPSULE | Freq: Every day | ORAL | Status: DC
  Administered 2018-02-21 – 2018-02-22 (×2): 60 mg via ORAL
  Filled 2018-02-20 (×2): qty 1

## 2018-02-20 MED ORDER — LEVOTHYROXINE SODIUM 50 MCG PO TABS
50.0000 ug | ORAL_TABLET | Freq: Every day | ORAL | Status: DC
  Administered 2018-02-21 – 2018-02-22 (×2): 50 ug via ORAL
  Filled 2018-02-20 (×2): qty 1

## 2018-02-20 MED ORDER — AMLODIPINE BESYLATE 10 MG PO TABS
10.0000 mg | ORAL_TABLET | Freq: Every day | ORAL | Status: DC
  Administered 2018-02-20 – 2018-02-22 (×2): 10 mg via ORAL
  Filled 2018-02-20: qty 2
  Filled 2018-02-20: qty 1

## 2018-02-20 MED ORDER — SODIUM CHLORIDE 0.9% FLUSH
3.0000 mL | Freq: Two times a day (BID) | INTRAVENOUS | Status: DC
  Administered 2018-02-20 – 2018-02-21 (×2): 3 mL via INTRAVENOUS

## 2018-02-20 MED ORDER — ACETAMINOPHEN 325 MG PO TABS: 650.0000 mg | ORAL_TABLET | Freq: Four times a day (QID) | ORAL | Status: DC | PRN

## 2018-02-20 MED ORDER — ACETAMINOPHEN 325 MG PO TABS
650.0000 mg | ORAL_TABLET | ORAL | Status: DC | PRN
  Administered 2018-02-21: 650 mg via ORAL
  Filled 2018-02-20: qty 2

## 2018-02-20 MED ORDER — CARBAMAZEPINE ER 200 MG PO TB12
600.0000 mg | ORAL_TABLET | Freq: Two times a day (BID) | ORAL | Status: DC
  Administered 2018-02-20 – 2018-02-22 (×4): 600 mg via ORAL
  Filled 2018-02-20 (×4): qty 3

## 2018-02-20 MED ORDER — MEMANTINE HCL 5 MG PO TABS
10.0000 mg | ORAL_TABLET | Freq: Two times a day (BID) | ORAL | Status: DC
  Administered 2018-02-20 – 2018-02-22 (×4): 10 mg via ORAL
  Filled 2018-02-20 (×2): qty 1
  Filled 2018-02-20 (×2): qty 2

## 2018-02-20 MED ORDER — ACETAMINOPHEN 500 MG PO TABS
500.0000 mg | ORAL_TABLET | Freq: Every day | ORAL | Status: DC | PRN
  Administered 2018-02-20: 1000 mg via ORAL
  Filled 2018-02-20: qty 2

## 2018-02-20 MED ORDER — ASPIRIN 300 MG RE SUPP: 300.0000 mg | RECTAL | Status: AC

## 2018-02-20 MED ORDER — ROSUVASTATIN CALCIUM 10 MG PO TABS
5.0000 mg | ORAL_TABLET | ORAL | Status: DC
  Administered 2018-02-22: 5 mg via ORAL
  Filled 2018-02-20: qty 1

## 2018-02-20 NOTE — ED Notes (Signed)
Admitting paged to let them know the [employer] report is at pt bedside.

## 2018-02-20 NOTE — Progress Notes (Signed)
ANTICOAGULATION CONSULT NOTE - Initial Consult  Pharmacy Consult for heparin Indication: chest pain/ACS  Allergies  Allergen Reactions  . Aspirin Other (See Comments)    GI upset  . Erythromycin Nausea Only    Gi upset  . Lisinopril Cough  . Niacin And Related Other (See Comments)    Pt does not recall reactions   . Statins Other (See Comments)    Muscle pain  . Sulfa Drugs Cross Reactors Swelling  . Tetracyclines & Related Nausea Only  . Penicillins Nausea Only and Rash    Has patient had a PCN reaction causing immediate rash, facial/tongue/throat swelling, SOB or lightheadedness with hypotension: Yes Has patient had a PCN reaction causing severe rash involving mucus membranes or skin necrosis: No Has patient had a PCN reaction that required hospitalization No Has patient had a PCN reaction occurring within the last 10 years: No If all of the above answers are "NO", then may proceed with Cephalosporin use.   Marland Kitchen Plavix [Clopidogrel Bisulfate] Rash    Patient Measurements: Height: 5\' 9"  (175.3 cm) Weight: 181 lb (82.1 kg) IBW/kg (Calculated) : 66.2 Heparin Dosing Weight: 82.1 kg  Vital Signs: Temp: 98 F (36.7 C) (08/21 1247) Temp Source: Oral (08/21 1247) BP: 142/67 (08/21 2015) Pulse Rate: 72 (08/21 2015)  Labs: Recent Labs    02/20/18 1055  HGB 14.0  HCT 43.5  PLT 296  CREATININE 0.74    Estimated Creatinine Clearance: 77.1 mL/min (by C-G formula based on SCr of 0.74 mg/dL).   Medical History: Past Medical History:  Diagnosis Date  . Arthritis    "fingers" (06/29/2016)  . Chronic lower back pain   . Colon cancer ([place]) 12/04/2011   s/p Laparoscopic-assisted transverse colectomy on 12/19/2011 by Dr. Donne Hazel.  pT3 N0 M0.   . Coronary artery disease    a. remote NSTEMI with PCI to the RCA; b. S/P CABG in 2007; c. 2010 - 2 v CAD with L-LAD and S-RCA patent, small caliber Dx 70% (treated medically - no amenable to PCI), CFX free of significant disease, normal  LVF. d. 10/2014 Lexi MV: EF 61%, no ischemia/infarct.  . Depression   . Dyslipidemia   . Fibromyalgia   . Gastric ulcer   . GERD (gastroesophageal reflux disease)   . 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 Headache    "weekly" (06/29/2016)  . Hyperlipidemia   . Hypothyroid   . Iron deficiency anemia 11/17/2011  . Memory change 11/13/2016  . Monoclonal gammopathy of unknown significance   . Myocardial infarction ([place]) 2007  . Presence of permanent cardiac pacemaker   . Syncope and collapse    pacemaker implanted  . Unstable angina ([facility])     Assessment: 1 yoF with prior hx of CABG and graft occlusion here with chest pain. No anticoagulation reported PTA. CBC WNL. Patient scheduled for cath lab in the morning. Pharmacy to start heparin infusion.   Goal of Therapy:  Heparin level 0.3-0.7 units/ml Monitor platelets by anticoagulation protocol: Yes   Plan:  Give 4000 units bolus x 1 Start heparin infusion at 1000 units/hr Check anti-Xa level in 6 hours and daily while on heparin Continue to monitor H&H and platelets  Georga Bora, PharmD Clinical Pharmacist 02/20/2018 9:03 PM Please check AMION for all [place] numbers

## 2018-02-20 NOTE — ED Triage Notes (Signed)
Pt in c/o chest pain and shortness of breath that started yesterday while at work, she thought it was the work environment because there was not air conditioning and the symptoms resolved once at home, today symptoms returned again while at work in the same place but due to history she wanted to come in for evaluation, states symptoms are improving at this time

## 2018-02-20 NOTE — ED Provider Notes (Signed)
[facility] EMERGENCY DEPARTMENT Provider Note   CSN: [id number] Arrival date & time: 02/20/18  1040     History   Chief Complaint Chief Complaint  Patient presents with  . Chest Pain    HPI Rachel Clark is a 67 y.o. female.  HPI  67 year old female presents with chest pain.  Originally started yesterday.  Was at work when it started and had to leave early.  She took a nitroglycerin at home and then took a nap.  When she awoke from the nap she did not feel great but had no further chest pain.  Chest pain again recurred at work today and so she came home.  Took another nitroglycerin and then came here.  Pain has been persistent up until the point when she came into the ER room and now is pain-free.  She did have some nausea but no other symptoms.  She is not sure if it feels similar to her prior anginal equivalents.  She is had an MI before and has had multiple stents and a CABG.  Currently pain-free.  Past Medical History:  Diagnosis Date  . Arthritis    "fingers" (06/29/2016)  . Chronic lower back pain   . Colon cancer ([place]) 12/04/2011   s/p Laparoscopic-assisted transverse colectomy on 12/19/2011 by Dr. Donne Hazel.  pT3 N0 M0.   . Coronary artery disease    a. remote NSTEMI with PCI to the RCA; b. S/P CABG in 2007; c. 2010 - 2 v CAD with L-LAD and S-RCA patent, small caliber Dx 70% (treated medically - no amenable to PCI), CFX free of significant disease, normal LVF. d. 10/2014 Lexi MV: EF 61%, no ischemia/infarct.  . Depression   . Dyslipidemia   . Fibromyalgia   . Gastric ulcer   . GERD (gastroesophageal reflux disease)   . 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 Headache    "weekly" (06/29/2016)  . Hyperlipidemia   . Hypothyroid   . Iron deficiency anemia 11/17/2011  . Memory change 11/13/2016  . Monoclonal gammopathy of unknown significance   . Myocardial infarction ([place]) 2007  .  Presence of permanent cardiac pacemaker   . Syncope and collapse    pacemaker implanted  . Unstable angina [facility])     Patient Active Problem List   Diagnosis Date Noted  . Memory change 11/13/2016  . Acute bronchitis 03/23/2016  . Chest pain with high risk for cardiac etiology 11/05/2015  . Essential hypertension 08/25/2015  . Sick sinus syndrome ([place]) 08/16/2015  . Syncope and collapse 08/10/2015  . CAD, multiple vessel   . Chest pain   . Cardiac pacemaker in situ 07/25/2015  . Angina pectoris ([place]) 03/24/2015  . Unstable angina ([place]) 10/26/2014  . Seizures ([place]) 02/19/2014  . Lung nodule 02/06/2013  . Thyroid nodule 02/06/2013  . History of colon cancer 12/04/2011  . GERD (gastroesophageal reflux disease) 08/17/2011  . Monoclonal gammopathy of unknown significance   . Hypothyroid   . Coronary artery disease, post CABG 2007    . Dyslipidemia     Past Surgical History:  Procedure Laterality Date  . ANTERIOR CERVICAL DECOMP/DISCECTOMY FUSION  1980's  . BACK SURGERY    . CARDIAC CATHETERIZATION  05/20/2009   obstructive native vessel disease in LAD, RCA, and first diagonal, patent vein graft to distal RCA and LIMA to LAD,normal. ef 60%  . CARDIAC CATHETERIZATION N/A 03/24/2015   Procedure: Left Heart  Cath and Cors/Grafts Angiography;  Surgeon: Wellington Hampshire, MD;  Location: [facility] CV LAB;  Service: Cardiovascular;  Laterality: N/A;  . CARDIAC CATHETERIZATION N/A 07/28/2015   Procedure: Left Heart Cath and Coronary Angiography;  Surgeon: Troy Sine, MD;  Location: [facility] CV LAB;  Service: Cardiovascular;  Laterality: N/A;  . CARDIAC CATHETERIZATION N/A 11/09/2015   Procedure: Coronary Stent Intervention;  Surgeon: Sherren Mocha, MD;  Location: [facility] CV LAB;  Service: Cardiovascular;  Laterality: N/A;  . CARDIAC CATHETERIZATION N/A 11/09/2015   Procedure: Left Heart Cath and Coronary Angiography;  Surgeon: Sherren Mocha, MD;  Location: [facility] CV LAB;   Service: Cardiovascular;  Laterality: N/A;  . CARDIAC CATHETERIZATION N/A 06/29/2016   Procedure: Left Heart Cath and Coronary Angiography;  Surgeon: Nelva Bush, MD;  Location: [facility] CV LAB;  Service: Cardiovascular;  Laterality: N/A;  . CARDIAC CATHETERIZATION N/A 06/29/2016   Procedure: Intravascular Pressure Wire/FFR Study;  Surgeon: Nelva Bush, MD;  Location: [place] CV LAB;  Service: Cardiovascular;  Laterality: N/A;  . CARDIAC CATHETERIZATION N/A 06/29/2016   Procedure: Coronary Balloon Angioplasty;  Surgeon: Nelva Bush, MD;  Location:  CV LAB;  Service: Cardiovascular;  Laterality: N/A;  . COLON RESECTION  12/19/2011   Procedure: COLON RESECTION LAPAROSCOPIC;  Surgeon: Rolm Bookbinder, MD;  Location: [facility];  Service: General;  Laterality: N/A;  laparoscopic hand assisted partial colon resection  . COLON SURGERY    . COLONOSCOPY    . CORONARY ANGIOPLASTY WITH STENT PLACEMENT  ~ 2007   1 stent  . CORONARY ARTERY BYPASS GRAFT  2007   "CABG X2"  . DILATION AND CURETTAGE OF UTERUS  1973  . EP IMPLANTABLE DEVICE N/A 03/25/2015   MDT Advisa DR pacemaker implanted by Dr Rayann Heman for transient complete heart block and syncope  . INSERT / REPLACE / REMOVE PACEMAKER    . PORT-A-CATH REMOVAL N/A 06/12/2013   Procedure: REMOVAL PORT-A-CATH;  Surgeon: Rolm Bookbinder, MD;  Location: [facility];  Service: General;  Laterality: N/A;  . PORTACATH PLACEMENT  06/04/2012   Procedure: INSERTION PORT-A-CATH;  Surgeon: Rolm Bookbinder, MD;  Location: [facility];  Service: General;  Laterality: N/A;  Insertion of port-a-cath   . POSTERIOR LAMINECTOMY / DECOMPRESSION CERVICAL SPINE  1990s  . VAGINAL HYSTERECTOMY       OB History   None      Home Medications    Prior to Admission medications   Medication Sig Start Date End Date Taking? Authorizing Provider  acetaminophen (TYLENOL) 500 MG tablet Take 500-1,000 mg by mouth daily as needed for mild pain  or headache.   Yes [provider]  amLODipine (NORVASC) 10 MG tablet Take 10 mg by mouth daily.    Yes [provider]  aspirin 81 MG chewable tablet Chew 1 tablet (81 mg total) by mouth daily. 06/30/16  Yes Arbutus Leas, NP  Azelastine HCl 0.15 % SOLN Place 1 spray into both nostrils daily as needed (seasonal allergies).  12/22/14  Yes [provider]  carbamazepine (TEGRETOL XR) 200 MG 12 hr tablet TAKE 3 TABLETS TWICE A DAY Patient taking differently: Take 600 mg by mouth 2 (two) times daily.  02/11/18  Yes Ward Givens, NP  carvedilol (COREG) 12.5 MG tablet TAKE 1 TABLET (12.5 MG TOTAL) BY MOUTH 2 (TWO) TIMES DAILY WITH A MEAL. 09/04/16  Yes [name], Peter M, MD  DULoxetine (CYMBALTA) 60 MG capsule Take 60 mg by mouth daily.    Yes  [provider]  ethosuximide (ZARONTIN) 250 MG capsule Take 2 capsule (500mg ) in the morning, , and 2 capsules (500mg ) at night Patient taking differently: Take 500 mg by mouth 2 (two) times daily.  04/27/17  Yes Kathrynn Ducking, MD  ezetimibe (ZETIA) 10 MG tablet Take 1 tablet (10 mg total) by mouth daily. 12/20/16  Yes [name], Peter M, MD  ibuprofen (ADVIL,MOTRIN) 600 MG tablet Take 1 tablet (600 mg total) by mouth every 8 (eight) hours as needed. Patient taking differently: Take 600 mg by mouth every 8 (eight) hours as needed for headache or mild pain.  04/07/17  Yes Jola Schmidt, MD  isosorbide mononitrate (IMDUR) 30 MG 24 hr tablet Take 1 tablet (30 mg total) by mouth daily. 01/23/18  Yes [name], Peter M, MD  levothyroxine (SYNTHROID, LEVOTHROID) 50 MCG tablet Take 50 mcg by mouth daily.    Yes [provider]  memantine (NAMENDA) 10 MG tablet TAKE 1 TABLET BY MOUTH TWICE A DAY Patient taking differently: Take 10 mg by mouth 2 (two) times daily.  11/06/17  Yes Ward Givens, NP  nitroGLYCERIN (NITROSTAT) 0.4 MG SL tablet Place 1 tablet (0.4 mg total) under the tongue every 5 (five) minutes as needed. For chest  pain. Patient taking differently: Place 0.4 mg under the tongue every 5 (five) minutes as needed for chest pain.  04/05/15  Yes Burtis Junes, NP  rosuvastatin (CRESTOR) 5 MG tablet Take 1 tablet by mouth up to 3 times weekly as directed 07/06/16  Yes [name], Peter M, MD  estradiol (VIVELLE-DOT) 0.05 MG/24HR patch Place 1 patch onto the skin 2 (two) times a week. Sunday, Wednesday    [provider]    Family History Family History  Problem Relation Age of Onset  . Heart attack Father   . Heart disease Father   . Heart attack Brother 39  . Cancer Mother        lung  . Stroke Neg Hx     Social History Social History   Tobacco Use  . Smoking status: Former Smoker    Packs/day: 0.30    Years: 20.00    Pack years: 6.00    Types: Cigarettes    Last attempt to quit: 06/06/1981    Years since quitting: 36.7  . Smokeless tobacco: Never Used  Substance Use Topics  . Alcohol use: Yes    Alcohol/week: 1.0 standard drinks    Types: 1 Glasses of wine per week  . Drug use: No     Allergies   Aspirin; Erythromycin; Lisinopril; Niacin and related; Statins; Sulfa drugs cross reactors; Tetracyclines & related; and Penicillins   Review of Systems Review of Systems  Constitutional: Negative for diaphoresis.  Respiratory: Negative for shortness of breath.   Cardiovascular: Positive for chest pain.  Gastrointestinal: Positive for nausea. Negative for abdominal pain and vomiting.  All other systems reviewed and are negative.    Physical Exam Updated Vital Signs BP (!) 158/85   Pulse 96   Temp 98 F (36.7 C) (Oral)   Resp 16   Ht 5\' 9"  (1.753 m)   Wt 82.1 kg   SpO2 96%   BMI 26.73 kg/m   Physical Exam  Constitutional: She is oriented to person, place, and time. She appears well-developed and well-nourished.  Non-toxic appearance. She does not appear ill. No distress.  HENT:  Head: Normocephalic and atraumatic.  Right Ear: External ear normal.  Left Ear: External  ear normal.  Nose: Nose normal.  Eyes: Right eye exhibits no discharge. Left eye exhibits no discharge.  Cardiovascular: Normal rate, regular rhythm and normal heart sounds.  Pulses:      Radial pulses are 2+ on the right side.  Pulmonary/Chest: Effort normal and breath sounds normal.  Abdominal: Soft. There is no tenderness.  Musculoskeletal: She exhibits no edema.  Neurological: She is alert and oriented to person, place, and time.  Skin: Skin is warm and dry. She is not diaphoretic.  Nursing note and vitals reviewed.    ED Treatments / Results  Labs (all labs ordered are listed, but only abnormal results are displayed) Labs Reviewed  BASIC METABOLIC PANEL - Abnormal; Notable for the following components:      Result Value   Glucose, Bld 116 (*)    All other components within normal limits  CBC  TROPONIN I  I-STAT TROPONIN, ED    EKG EKG Interpretation  Date/Time:  Wednesday February 20 2018 10:47:15 EDT Ventricular Rate:  97 PR Interval:  178 QRS Duration: 78 QT Interval:  334 QTC Calculation: 424 R Axis:   58 Text Interpretation:  Normal sinus rhythm Nonspecific ST and T wave abnormality Abnormal ECG no significant change since Dec 2017 Confirmed by Sherwood Gambler [phone]) on 02/20/2018 4:18:33 PM   EKG Interpretation  Date/Time:  Wednesday February 20 2018 18:50:41 EDT Ventricular Rate:  92 PR Interval:  178 QRS Duration: 84 QT Interval:  341 QTC Calculation: 422 R Axis:   46 Text Interpretation:  Sinus rhythm Nonspecific T abnormalities, lateral leads no significant change since earlier in the day Confirmed by Sherwood Gambler [phone]) on 02/20/2018 7:23:26 PM        Radiology Dg Chest 2 View  Result Date: 02/20/2018 CLINICAL DATA:  Chest pain.  CABG. EXAM: CHEST - 2 VIEW COMPARISON:  11/05/2015 FINDINGS: Heart size and pulmonary vascularity are normal. Lungs are clear except for minimal tenting of the left hemidiaphragm, stable. Pacemaker in place. Coronary  artery stents. CABG. There is a new fracture of the distal most sternal wire. IMPRESSION: No active cardiopulmonary disease. Electronically Signed   By: Lorriane Shire M.D.   On: 02/20/2018 11:13    Procedures Procedures (including critical care time)  Medications Ordered in ED Medications - No data to display   Initial Impression / Assessment and Plan / ED Course  I have reviewed the triage vital signs and the nursing notes.  Pertinent labs & imaging results that were available during my care of the patient were reviewed by me and considered in my medical decision making (see chart for details).     Patient is pain-free on my exam.  However her history is quite concerning with unprovoked chest pain and her extensive coronary history.  Thus cardiology has been consulted and will admit for unstable angina.  Final Clinical Impressions(s) / ED Diagnoses   Final diagnoses:  Unstable angina [facility])    ED Discharge Orders    None       Sherwood Gambler, MD 02/20/18 2113

## 2018-02-20 NOTE — ED Notes (Signed)
Charge nurse notified of pt threatening to leave. Pt consulted about wait time.

## 2018-02-20 NOTE — ED Notes (Signed)
[employer] pacemaker interrogated °

## 2018-02-20 NOTE — H&P (Addendum)
Cardiology Admission History and Physical:   Patient ID: Rachel Clark; MRN: [id number]; DOB: 1951/02/06   Admission date: 02/20/2018  Primary Care Provider: Harlan Stains, MD Primary Cardiologist: Peter [name], MD  Primary Electrophysiologist:  Dr. Rayann Heman Chief Complaint:  Chest pain dyspnea  Patient Profile:   Rachel Clark is a 67 y.o. female with a history of CAD, with CABG 2007 and last cath 2017 with severe disease of RCA and DES stents full metal jacket, HLD, HTN, hypothyroid, anemia, CHB with PPM.   History of Present Illness:   Rachel Clark  Was last seen by Dr. [name] in 10/2016 and she was stable at that time.  She has had CABG 2007, and last cath 2017 with full metal jacket to RCA.  Cath also showed Cath showed severe native vessel disease to the LAD and RCA, s/p CABG with patency of the LIMA-LAD and chronic occlusion of the SVG-RCA.  Recommended life long DAPT.  Later that year she had in stent restenosis of RCA and underwent PTCA.   She had syncope in past with CHB and has PPM--MDT ( no recent eval)  She is to see Dr. Rayann Heman in Sept.   Also hx of colon CA, seizures, HLD and can only tolerate low dose crestor.   Pt last seen 10/2016 and has been doing very well. No chest pain or SOB.  Yesterday she went to work and while there she developed chest pain so went home.  The air conditioning was out and she though she was too hot.  She took NTG and pain resolved.  It was associated with dyspnea.  She felt tired rest of night and did not eat much.    This AM she woke feeling her usual but again at work developed chest pain and went home and took NTG.  Again pain improved but she did continue with pain off and on along with nausea.  Here in ER she has taken ASA and still has chest pain.  Though overall improved.  Her husband and son are in room with her.    EKG:  The ECG that was done in ER was personally reviewed and demonstrates SR with non specific ST abnormalities.     Troponin poc 0.00, Na 137, K+ 4.2, BUN 14 and Cr 0.74  Hgb 14, Plts 296  CXR without active disease. Troponin I peninding    Past Medical History:  Diagnosis Date  . Arthritis    "fingers" (06/29/2016)  . Chronic lower back pain   . Colon cancer ([place]) 12/04/2011   s/p Laparoscopic-assisted transverse colectomy on 12/19/2011 by Dr. Donne Hazel.  pT3 N0 M0.   . Coronary artery disease    a. remote NSTEMI with PCI to the RCA; b. S/P CABG in 2007; c. 2010 - 2 v CAD with L-LAD and S-RCA patent, small caliber Dx 70% (treated medically - no amenable to PCI), CFX free of significant disease, normal LVF. d. 10/2014 Lexi MV: EF 61%, no ischemia/infarct.  . Depression   . Dyslipidemia   . Fibromyalgia   . Gastric ulcer   . GERD (gastroesophageal reflux disease)   . 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 Headache    "weekly" (06/29/2016)  . Hyperlipidemia   . Hypothyroid   . Iron deficiency anemia 11/17/2011  . Memory change 11/13/2016  . Monoclonal gammopathy of unknown significance   . Myocardial infarction ([place]) 2007  . Presence of permanent  cardiac pacemaker   . Syncope and collapse    pacemaker implanted  . Unstable angina [facility])     Past Surgical History:  Procedure Laterality Date  . ANTERIOR CERVICAL DECOMP/DISCECTOMY FUSION  1980's  . BACK SURGERY    . CARDIAC CATHETERIZATION  05/20/2009   obstructive native vessel disease in LAD, RCA, and first diagonal, patent vein graft to distal RCA and LIMA to LAD,normal. ef 60%  . CARDIAC CATHETERIZATION N/A 03/24/2015   Procedure: Left Heart Cath and Cors/Grafts Angiography;  Surgeon: Wellington Hampshire, MD;  Location: [place] CV LAB;  Service: Cardiovascular;  Laterality: N/A;  . CARDIAC CATHETERIZATION N/A 07/28/2015   Procedure: Left Heart Cath and Coronary Angiography;  Surgeon: Troy Sine, MD;  Location: [place] CV LAB;  Service: Cardiovascular;  Laterality:  N/A;  . CARDIAC CATHETERIZATION N/A 11/09/2015   Procedure: Coronary Stent Intervention;  Surgeon: Sherren Mocha, MD;  Location: [place] [facility];  Service: Cardiovascular;  Laterality: N/A;  . CARDIAC CATHETERIZATION N/A 11/09/2015   Procedure: Left Heart Cath and Coronary Angiography;  Surgeon: Sherren Mocha, MD;  Location: [place] [facility];  Service: Cardiovascular;  Laterality: N/A;  . CARDIAC CATHETERIZATION N/A 06/29/2016   Procedure: Left Heart Cath and Coronary Angiography;  Surgeon: Nelva Bush, MD;  Location: [place] CV LAB;  Service: Cardiovascular;  Laterality: N/A;  . CARDIAC CATHETERIZATION N/A 06/29/2016   Procedure: Intravascular Pressure Wire/FFR Study;  Surgeon: Nelva Bush, MD;  Location: [place] [facility];  Service: Cardiovascular;  Laterality: N/A;  . CARDIAC CATHETERIZATION N/A 06/29/2016   Procedure: Coronary Balloon Angioplasty;  Surgeon: Nelva Bush, MD;  Location: [place] CV LAB;  Service: Cardiovascular;  Laterality: N/A;  . COLON RESECTION  12/19/2011   Procedure: COLON RESECTION LAPAROSCOPIC;  Surgeon: Rolm Bookbinder, MD;  Location: [place];  Service: General;  Laterality: N/A;  laparoscopic hand assisted partial colon resection  . COLON SURGERY    . COLONOSCOPY    . CORONARY ANGIOPLASTY WITH STENT PLACEMENT  ~ 2007   1 stent  . CORONARY ARTERY BYPASS GRAFT  2007   "CABG X2"  . DILATION AND CURETTAGE OF UTERUS  1973  . EP IMPLANTABLE DEVICE N/A 03/25/2015   MDT Advisa DR pacemaker implanted by Dr Rayann Heman for transient complete heart block and syncope  . INSERT / REPLACE / REMOVE PACEMAKER    . PORT-A-CATH REMOVAL N/A 06/12/2013   Procedure: REMOVAL PORT-A-CATH;  Surgeon: Rolm Bookbinder, MD;  Location: [place];  Service: General;  Laterality: N/A;  . PORTACATH PLACEMENT  06/04/2012   Procedure: INSERTION PORT-A-CATH;  Surgeon: Rolm Bookbinder, MD;  Location: [place];  Service: General;  Laterality: N/A;  Insertion of  port-a-cath   . POSTERIOR LAMINECTOMY / DECOMPRESSION CERVICAL SPINE  1990s  . VAGINAL HYSTERECTOMY       Medications Prior to Admission: Prior to Admission medications   Medication Sig Start Date End Date Taking? Authorizing Provider  acetaminophen (TYLENOL) 500 MG tablet Take 500-1,000 mg by mouth daily as needed for mild pain or headache.   Yes [provider]  amLODipine (NORVASC) 10 MG tablet Take 10 mg by mouth daily.    Yes [provider]  aspirin 81 MG chewable tablet Chew 1 tablet (81 mg total) by mouth daily. 06/30/16  Yes Arbutus Leas, NP  Azelastine HCl 0.15 % SOLN Place 1 spray into both nostrils daily as needed (seasonal allergies).  12/22/14  Yes [provider]  carbamazepine (TEGRETOL XR) 200  MG 12 hr tablet TAKE 3 TABLETS TWICE A DAY Patient taking differently: Take 600 mg by mouth 2 (two) times daily.  02/11/18  Yes Ward Givens, NP  carvedilol (COREG) 12.5 MG tablet TAKE 1 TABLET (12.5 MG TOTAL) BY MOUTH 2 (TWO) TIMES DAILY WITH A MEAL. 09/04/16  Yes [name], Peter M, MD  DULoxetine (CYMBALTA) 60 MG capsule Take 60 mg by mouth daily.    Yes [provider]  ethosuximide (ZARONTIN) 250 MG capsule Take 2 capsule (531m) in the morning, , and 2 capsules (508m at night Patient taking differently: Take 500 mg by mouth 2 (two) times daily.  04/27/17  Yes WiKathrynn DuckingMD  ezetimibe (ZETIA) 10 MG tablet Take 1 tablet (10 mg total) by mouth daily. 12/20/16  Yes JoMartiniquePeter M, MD  ibuprofen (ADVIL,MOTRIN) 600 MG tablet Take 1 tablet (600 mg total) by mouth every 8 (eight) hours as needed. Patient taking differently: Take 600 mg by mouth every 8 (eight) hours as needed for headache or mild pain.  04/07/17  Yes CaJola SchmidtMD  isosorbide mononitrate (IMDUR) 30 MG 24 hr tablet Take 1 tablet (30 mg total) by mouth daily. 01/23/18  Yes JoMartiniquePeter M, MD  levothyroxine (SYNTHROID, LEVOTHROID) 50 MCG tablet Take 50 mcg by mouth daily.    Yes  [provider]  memantine (NAMENDA) 10 MG tablet TAKE 1 TABLET BY MOUTH TWICE A DAY Patient taking differently: Take 10 mg by mouth 2 (two) times daily.  11/06/17  Yes MiWard GivensNP  nitroGLYCERIN (NITROSTAT) 0.4 MG SL tablet Place 1 tablet (0.4 mg total) under the tongue every 5 (five) minutes as needed. For chest pain. Patient taking differently: Place 0.4 mg under the tongue every 5 (five) minutes as needed for chest pain.  04/05/15  Yes GeBurtis JunesNP  rosuvastatin (CRESTOR) 5 MG tablet Take 1 tablet by mouth up to 3 times weekly as directed 07/06/16  Yes JoMartiniquePeter M, MD  estradiol (VIVELLE-DOT) 0.05 MG/24HR patch Place 1 patch onto the skin 2 (two) times a week. Sunday, Wednesday    [provider]     Allergies:    Allergies  Allergen Reactions  . Aspirin Other (See Comments)    GI upset  . Erythromycin Nausea Only    Gi upset  . Lisinopril Cough  . Niacin And Related Other (See Comments)    Pt does not recall reactions   . Statins Other (See Comments)    Muscle pain  . Sulfa Drugs Cross Reactors Swelling  . Tetracyclines & Related Nausea Only  . Penicillins Nausea Only and Rash    Has patient had a PCN reaction causing immediate rash, facial/tongue/throat swelling, SOB or lightheadedness with hypotension: Yes Has patient had a PCN reaction causing severe rash involving mucus membranes or skin necrosis: No Has patient had a PCN reaction that required hospitalization No Has patient had a PCN reaction occurring within the last 10 years: No If all of the above answers are "NO", then may proceed with Cephalosporin use.     Social History:   Social History   Socioeconomic History  . Marital status: Married    Spouse name: Not on file  . Number of children: 2  . Years of education: 1258. Highest education level: Not on file  Occupational History  . 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 Financial resource strain:  Not on file  . Food insecurity:    Worry: Not on file    Inability: Not on file  . Transportation needs:    Medical: Not on file    Non-medical: Not on file  Tobacco Use  . Smoking status: Former Smoker    Packs/day: 0.30    Years: 20.00    Pack years: 6.00    Types: Cigarettes    Last attempt to quit: 06/06/1981    Years since quitting: 36.7  . Smokeless tobacco: Never Used  Substance and Sexual Activity  . Alcohol use: Yes    Alcohol/week: 1.0 standard drinks    Types: 1 Glasses of wine per week  . Drug use: No  . Sexual activity: Not Currently    Birth control/protection: Surgical  Lifestyle  . Physical activity:    Days per week: Not on file    Minutes per session: Not on file  . Stress: Not on file  Relationships  . Social connections:    Talks on phone: Not on file    Gets together: Not on file    Attends religious service: Not on file    Active member of club or organization: Not on file    Attends meetings of clubs or organizations: Not on file    Relationship status: Not on file  . Intimate partner violence:    Fear of current or ex partner: Not on file    Emotionally abused: Not on file    Physically abused: Not on file    Forced sexual activity: Not on file  Other Topics Concern  . Not on file  Social History Narrative   Patient is married with 2 children.   Patient is right handed.   Patient has a high school education with some college education.   Patient drinks 2 cups daily.    Family History:   The patient's family history includes Cancer in her mother; Heart attack in her father; Heart attack (age of onset: 62) in her brother; Heart disease in her father. There is no history of Stroke.    ROS:  Please see the history of present illness.  General:no colds or fevers, no weight changes Skin:no rashes or ulcers HEENT:no blurred vision, no congestion CV:see HPI PUL:see HPI GI:no diarrhea constipation or melena,  no indigestion GU:no hematuria, no dysuria MS:no joint pain, no claudication Neuro:no syncope, no lightheadedness, no seizures Endo:no diabetes, + thyroid disease All other ROS reviewed and negative.     Physical Exam/Data:   Vitals:   02/20/18 1900 02/20/18 1915 02/20/18 1930 02/20/18 2000  BP: 135/83 (!) 150/78 (!) 145/64 (!) 143/71  Pulse: 90 83 80 76  Resp: (!) 22 12 (!) 23 11  Temp:      TempSrc:      SpO2: 96% 98% 100% 98%  Weight:      Height:       No intake or output data in the 24 hours ending 02/20/18 2010 Filed Weights   02/20/18 1855  Weight: 82.1 kg   Body mass index is 26.73 kg/m.  General:  Well nourished, well developed, in no acute distress but does not feel well HEENT: normal Lymph: no adenopathy Neck: no JVD Endocrine:  No thryomegaly Vascular: No carotid bruits; pedal pulses 2+ bilaterally  Cardiac:  normal S1, S2; RRR; no murmur, gallup rub or click  Lungs:  clear to auscultation bilaterally, no wheezing, rhonchi or rales  Abd: soft, nontender, no hepatomegaly  Ext: no lower ext  edema Musculoskeletal:  No deformities, BUE and BLE strength normal and equal Skin: warm and dry  Neuro:  Alert and oriented X 3 MAE follows commands, no focal abnormalities noted Psych:  Normal affect     Relevant CV Studies: Cath 06/29/16  Conclusions: 1. Significant 2-vessel coronary artery disease, including diffuse proximal/mid LAD disease of up to 95%, diffuse distal LAD disease of up to 75%, and 60% proximal RCA in-stent restenosis (FFR 0.75). 2. Mildly elevated left ventricular filling pressure (LVEDP 20 mmHg). 3. Basal inferior akinesis with otherwise preserved left ventricular contraction (LVEF 55-65%). 4. Successful FFR-guided balloon angioplasty of ostial/proximal RCA using 2.75 x 15 mm non-compliant balloon inflated to 20 atm.  Final angiogram demonstrates <10% residual stenosis with TIMI-3 flow.  Recommendations: 1. Continue dual antiplatelet therapy  with aspirin and clopidogrel for at least 1 month, ideally indefinitely. 2. Aggressive secondary prevention, including consideration of PCSK9 inhibitor, given statin intolerance. 3. Medical therapy for diffuse disease involving D1 and distal LAD (small vessels).  Echo: Jan 2017:Study Conclusions  - Left ventricle: The cavity size was normal. There was mild concentric hypertrophy. Systolic function was normal. The estimated ejection fraction was in the range of 60% to 65%. Wall motion was normal; there were no regional wall motion abnormalities. Left ventricular diastolic function parameters were normal. - Mitral valve: There was mild regurgitation. - Atrial septum: No defect or patent foramen ovale was identified bu color flow Doppler. - Tricuspid valve: There was trivial regurgitation. - Inferior vena cava: The vessel was normal in size. The respirophasic diameter changes were in the normal range (>= 50%), consistent with normal central venous pressure. - Global longitudinal strain -18.6%. Laboratory Data:  Chemistry Recent Labs  Lab 02/20/18 1055  NA 137  K 4.2  CL 102  CO2 26  GLUCOSE 116*  BUN 14  CREATININE 0.74  CALCIUM 9.0  GFRNONAA >60  GFRAA >60  ANIONGAP 9    No results for input(s): PROT, ALBUMIN, AST, ALT, ALKPHOS, BILITOT in the last 168 hours. Hematology Recent Labs  Lab 02/20/18 1055  WBC 9.1  RBC 4.75  HGB 14.0  HCT 43.5  MCV 91.6  MCH 29.5  MCHC 32.2  RDW 12.6  PLT 296   Cardiac EnzymesNo results for input(s): TROPONINI in the last 168 hours.  Recent Labs  Lab 02/20/18 1108  TROPIPOC 0.00    BNPNo results for input(s): BNP, PROBNP in the last 168 hours.  DDimer No results for input(s): DDIMER in the last 168 hours.  Radiology/Studies:  Dg Chest 2 View  Result Date: 02/20/2018 CLINICAL DATA:  Chest pain.  CABG. EXAM: CHEST - 2 VIEW COMPARISON:  11/05/2015 FINDINGS: Heart size and pulmonary vascularity are normal. Lungs  are clear except for minimal tenting of the left hemidiaphragm, stable. Pacemaker in place. Coronary artery stents. CABG. There is a new fracture of the distal most sternal wire. IMPRESSION: No active cardiopulmonary disease. Electronically Signed   By: Lorriane Shire M.D.   On: 02/20/2018 11:13    Assessment and Plan:   1. Unstable angina with long hx of CAD and CABG and graft occlusion.  With ongoing chest pain am adding IV heparin and IV NTG.  Most likely will need cardiac cath tomorrow with her hx.   Dr. [name] Coffee to see.  I will make NPO after MN and do serial troponins.  Pt stopped plavix due to rash.  2. CAD with CABG and last cath 2017 with patent LIMA-LAD, chronic occlusion of VG-RCA.  DES  to native RCA full metal jacket.  Later 06/2016 she had PTCA of ostial /prox RCA for in stent restenosis.  3. HLD on zetia and crestor low dose, cannot tolerate higher dose of statin.  Will check lipids. 4. HTN elevated here at times, adding NTG drip for chest pain.  Continue home meds. 5. Hx of seizures. 6. Hypothyroid will check TSH. 7.         Hx of CHB with MDT PPM last checked 2017, have asked nurse to check with [facility] here in ER.  She is to see Dr. Rayann Heman in Sept.       Severity of Illness: The appropriate patient status for this patient is INPATIENT. Inpatient status is judged to be reasonable and necessary in order to provide the required intensity of service to ensure the patient's safety. The patient's presenting symptoms, physical exam findings, and initial radiographic and laboratory data in the context of their chronic comorbidities is felt to place them at high risk for further clinical deterioration. Furthermore, it is not anticipated that the patient will be medically stable for discharge from the hospital within 2 midnights of admission. The following factors support the patient status of inpatient.   " The patient's presenting symptoms include unstable angina. " The worrisome physical  exam findings include ongoing pain and nausea at times. " The initial radiographic and laboratory data are worrisome because of stable currently. " The chronic co-morbidities include CAD, CABG with graft failure and several interventions to native RCA with occluded VG-RCA.   * I certify that at the point of admission it is my clinical judgment that the patient will require inpatient hospital care spanning beyond 2 midnights from the point of admission due to high intensity of service, high risk for further deterioration and high frequency of surveillance required.*    For questions or updates, please contact [facility] Please consult [email or web address] for contact info under Cardiology/STEMI.   Signed, Cecilie Kicks, NP  02/20/2018 8:10 PM   The patient was seen, examined and discussed with Cecilie Kicks, NP and I agree with the above.   67 year old female, with h/o CAD, s/p CABG in 2007, and last cath 2017 with Significant 2-vessel coronary artery disease, including diffuse proximal/mid LAD disease of up to 95%, diffuse distal LAD disease of up to 75%, and 60% proximal RCA in-stent restenosis (FFR 0.75), s/p Successful FFR-guided balloon angioplasty of ostial/proximal RCA using 2.75 x 15 mm non-compliant balloon inflated to 20 atm.  Final angiogram demonstrates <10% residual stenosis with TIMI-3 flow. LVEF 60-65%. She presented with symptoms highly suspicious for unstable angina. Started on iv NTG, heparin, with improved CP from 7/10 to chest heaviness and headache. Troponin negative x2 and ECG unchanged from prior, no acute changes.  We will plan for a cardiac cath in the am. Tylenol for headache.  The patient was supposed to be on DAPT indefinitely, she developed rash after taking Plavix and discontinued, no other agent was started, she has been on aspirin only probably since early after the last intervention.   Ena Dawley, MD 02/20/2018

## 2018-02-20 NOTE — ED Notes (Signed)
Moved patients acuity from 3 to 2 due to nausea and pain like last heart attack.

## 2018-02-20 NOTE — ED Notes (Signed)
Pt eating sandwich and drinking water.

## 2018-02-21 ENCOUNTER — Encounter (HOSPITAL_COMMUNITY): Payer: Self-pay | Admitting: General Practice

## 2018-02-21 ENCOUNTER — Encounter (HOSPITAL_COMMUNITY)
Admission: EM | Disposition: A | Discharge status: Home / Self Care | Payer: Self-pay | Source: Home / Self Care | Attending: Cardiology

## 2018-02-21 DIAGNOSIS — E7800 Pure hypercholesterolemia, unspecified: Secondary | ICD-10-CM

## 2018-02-21 HISTORY — PX: LEFT HEART CATH AND CORS/GRAFTS ANGIOGRAPHY: CATH118250

## 2018-02-21 LAB — CBC
HEMATOCRIT: 40.1 % (ref 36.0–46.0)
HEMOGLOBIN: 13.1 g/dL (ref 12.0–15.0)
MCH: 29.6 pg (ref 26.0–34.0)
MCHC: 32.7 g/dL (ref 30.0–36.0)
MCV: 90.5 fL (ref 78.0–100.0)
Platelets: 269 10*3/uL (ref 150–400)
RBC: 4.43 MIL/uL (ref 3.87–5.11)
RDW: 12.6 % (ref 11.5–15.5)
WBC: 9.3 10*3/uL (ref 4.0–10.5)

## 2018-02-21 LAB — BASIC METABOLIC PANEL
ANION GAP: 10 (ref 5–15)
BUN: 17 mg/dL (ref 8–23)
CO2: 21 mmol/L — ABNORMAL LOW (ref 22–32)
Calcium: 8.5 mg/dL — ABNORMAL LOW (ref 8.9–10.3)
Chloride: 104 mmol/L (ref 98–111)
Creatinine, Ser: 0.67 mg/dL (ref 0.44–1.00)
GFR calc Af Amer: >60 mL/min (ref >60)
GLUCOSE: 94 mg/dL (ref 70–99)
POTASSIUM: 3.9 mmol/L (ref 3.5–5.1)
Sodium: 135 mmol/L (ref 135–145)

## 2018-02-21 LAB — LIPID PANEL
CHOL/HDL RATIO: 3.2 ratio
CHOLESTEROL: 229 mg/dL — AB (ref 0–200)
HDL: 72 mg/dL (ref >40)
LDL Cholesterol: 138 mg/dL — ABNORMAL HIGH (ref 0–99)
Triglycerides: 97 mg/dL (ref <150)
VLDL: 19 mg/dL (ref 0–40)

## 2018-02-21 LAB — MRSA PCR SCREENING: MRSA by PCR: NEGATIVE

## 2018-02-21 LAB — HEPARIN LEVEL (UNFRACTIONATED)
HEPARIN UNFRACTIONATED: 0.33 [IU]/mL (ref 0.30–0.70)
Heparin Unfractionated: <0.1 IU/mL — ABNORMAL LOW (ref 0.30–0.70)

## 2018-02-21 LAB — TROPONIN I

## 2018-02-21 LAB — HIV ANTIBODY (ROUTINE TESTING W REFLEX): HIV Screen 4th Generation wRfx: NONREACTIVE

## 2018-02-21 SURGERY — LEFT HEART CATH AND CORS/GRAFTS ANGIOGRAPHY
Anesthesia: LOCAL

## 2018-02-21 MED ORDER — SODIUM CHLORIDE 0.9% FLUSH: 3.0000 mL | INTRAVENOUS | Status: DC | PRN

## 2018-02-21 MED ORDER — LIDOCAINE HCL (PF) 1 % IJ SOLN
INTRAMUSCULAR | Status: DC | PRN
  Administered 2018-02-21: 20 mL

## 2018-02-21 MED ORDER — ASPIRIN 81 MG PO CHEW
81.0000 mg | CHEWABLE_TABLET | Freq: Every day | ORAL | Status: DC
  Administered 2018-02-22: 81 mg via ORAL
  Filled 2018-02-21: qty 1

## 2018-02-21 MED ORDER — IOPAMIDOL (ISOVUE-370) INJECTION 76%
INTRAVENOUS | Status: AC
  Filled 2018-02-21: qty 100

## 2018-02-21 MED ORDER — ACETAMINOPHEN 325 MG PO TABS: 650.0000 mg | ORAL_TABLET | ORAL | Status: DC | PRN

## 2018-02-21 MED ORDER — SODIUM CHLORIDE 0.9 % IV SOLN: INTRAVENOUS | Status: AC

## 2018-02-21 MED ORDER — HEPARIN (PORCINE) IN NACL 1000-0.9 UT/500ML-% IV SOLN
INTRAVENOUS | Status: AC
  Filled 2018-02-21: qty 1000

## 2018-02-21 MED ORDER — SODIUM CHLORIDE 0.9% FLUSH
3.0000 mL | Freq: Two times a day (BID) | INTRAVENOUS | Status: DC
  Administered 2018-02-22: 3 mL via INTRAVENOUS

## 2018-02-21 MED ORDER — FENTANYL CITRATE (PF) 100 MCG/2ML IJ SOLN
INTRAMUSCULAR | Status: DC | PRN
  Administered 2018-02-21: 25 ug via INTRAVENOUS

## 2018-02-21 MED ORDER — MORPHINE SULFATE (PF) 2 MG/ML IV SOLN: 2.0000 mg | INTRAVENOUS | Status: DC | PRN

## 2018-02-21 MED ORDER — ONDANSETRON HCL 4 MG/2ML IJ SOLN: 4.0000 mg | Freq: Four times a day (QID) | INTRAMUSCULAR | Status: DC | PRN

## 2018-02-21 MED ORDER — IOPAMIDOL (ISOVUE-370) INJECTION 76%
INTRAVENOUS | Status: DC | PRN
  Administered 2018-02-21: 75 mL via INTRA_ARTERIAL

## 2018-02-21 MED ORDER — LIDOCAINE HCL (PF) 1 % IJ SOLN
INTRAMUSCULAR | Status: AC
  Filled 2018-02-21: qty 30

## 2018-02-21 MED ORDER — SODIUM CHLORIDE 0.9 % IV SOLN: 250.0000 mL | INTRAVENOUS | Status: DC | PRN

## 2018-02-21 MED ORDER — MIDAZOLAM HCL 2 MG/2ML IJ SOLN
INTRAMUSCULAR | Status: AC
  Filled 2018-02-21: qty 2

## 2018-02-21 MED ORDER — MIDAZOLAM HCL 2 MG/2ML IJ SOLN
INTRAMUSCULAR | Status: DC | PRN
  Administered 2018-02-21: 1 mg via INTRAVENOUS

## 2018-02-21 MED ORDER — HEPARIN (PORCINE) IN NACL 1000-0.9 UT/500ML-% IV SOLN
INTRAVENOUS | Status: DC | PRN
  Administered 2018-02-21 (×2): 500 mL

## 2018-02-21 MED ORDER — FENTANYL CITRATE (PF) 100 MCG/2ML IJ SOLN
INTRAMUSCULAR | Status: AC
  Filled 2018-02-21: qty 2

## 2018-02-21 SURGICAL SUPPLY — 8 items
CATH INFINITI 5FR MULTPACK ANG (CATHETERS) ×1 IMPLANT
DEVICE CLOSURE MYNXGRIP 5F (Vascular Products) ×1 IMPLANT
KIT HEART LEFT (KITS) ×2 IMPLANT
PACK CARDIAC CATHETERIZATION (CUSTOM PROCEDURE TRAY) ×2 IMPLANT
SHEATH PINNACLE 5F 10CM (SHEATH) ×1 IMPLANT
SYR MEDRAD MARK V 150ML (SYRINGE) ×2 IMPLANT
TRANSDUCER W/STOPCOCK (MISCELLANEOUS) ×2 IMPLANT
WIRE EMERALD 3MM-J .035X150CM (WIRE) ×1 IMPLANT

## 2018-02-21 NOTE — H&P (View-Only) (Signed)
Progress Note  Patient Name: Rachel Clark Date of Encounter: 02/21/2018  Primary Cardiologist: Peter [name], MD   Subjective   Feels well this am. No recurrent angina.   Inpatient Medications    Scheduled Meds: . acetaminophen  650 mg Oral Once  . amLODipine  10 mg Oral Daily  . aspirin  324 mg Oral NOW   Or  . aspirin  300 mg Rectal NOW  . aspirin EC  81 mg Oral Daily  . carbamazepine  600 mg Oral BID  . carvedilol  12.5 mg Oral BID WC  . DULoxetine  60 mg Oral Daily  . ethosuximide  500 mg Oral BID  . ezetimibe  10 mg Oral Daily  . levothyroxine  50 mcg Oral QAC breakfast  . memantine  10 mg Oral BID  . [START ON 02/22/2018] rosuvastatin  5 mg Oral Q M,W,F  . sodium chloride flush  3 mL Intravenous Q12H   Continuous Infusions: . sodium chloride    . sodium chloride 1 mL/kg/hr (02/21/18 0602)  . heparin 1,000 Units/hr (02/20/18 2133)  . nitroGLYCERIN Stopped (02/21/18 0923)   PRN Meds: sodium chloride, acetaminophen, acetaminophen, nitroGLYCERIN, ondansetron (ZOFRAN) IV, sodium chloride flush   Vital Signs    Vitals:   02/21/18 0744 02/21/18 0918 02/21/18 0920 02/21/18 0922  BP: (!) 118/57 95/63 94/64 94/64  Pulse: 75 76 69   Resp: _0 Temp:      TempSrc:      SpO2: 99% 94% 99%   Weight:      Height:       No intake or output data in the 24 hours ending 02/21/18 0956 Filed Weights   02/20/18 1855  Weight: 82.1 kg    Telemetry    NSR - Personally Reviewed  ECG    NSR with nonspecific ST T changes. - Personally Reviewed  Physical Exam   GEN: No acute distress.   Neck: No JVD Cardiac: RRR, no murmurs, rubs, or gallops.  Respiratory: Clear to auscultation bilaterally. GI: Soft, nontender, non-distended  MS: No edema; No deformity. Neuro:  Nonfocal  Psych: Normal affect   Labs    Chemistry Recent Labs  Lab 02/20/18 1055 02/20/18 2207 02/21/18 0254  NA 137  --  135  K 4.2  --  3.9  CL 102  --  104  CO2 26  --  21*    GLUCOSE 116*  --  94  BUN 14  --  17  CREATININE 0.74  --  0.67  CALCIUM 9.0  --  8.5*  PROT  --  6.6  --   ALBUMIN  --  3.4*  --   AST  --  15  --   ALT  --  17  --   ALKPHOS  --  72  --   BILITOT  --  0.6  --   GFRNONAA >60  --  >60  GFRAA >60  --  >60  ANIONGAP 9  --  10     Hematology Recent Labs  Lab 02/20/18 1055 02/21/18 0254  WBC 9.1 9.3  RBC 4.75 4.43  HGB 14.0 13.1  HCT 43.5 40.1  MCV 91.6 90.5  MCH 29.5 29.6  MCHC 32.2 32.7  RDW 12.6 12.6  PLT 296 269    Cardiac Enzymes Recent Labs  Lab 02/20/18 1948 02/21/18 0254  TROPONINI <0.03 <0.03    Recent Labs  Lab 02/20/18 1108  TROPIPOC 0.00  BNPNo results for input(s): BNP, PROBNP in the last 168 hours.   DDimer No results for input(s): DDIMER in the last 168 hours.   Radiology    Dg Chest 2 View  Result Date: 02/20/2018 CLINICAL DATA:  Chest pain.  CABG. EXAM: CHEST - 2 VIEW COMPARISON:  11/05/2015 FINDINGS: Heart size and pulmonary vascularity are normal. Lungs are clear except for minimal tenting of the left hemidiaphragm, stable. Pacemaker in place. Coronary artery stents. CABG. There is a new fracture of the distal most sternal wire. IMPRESSION: No active cardiopulmonary disease. Electronically Signed   By: Lorriane Shire M.D.   On: 02/20/2018 11:13    Cardiac Studies   Cath 06/29/16  Conclusions: 1. Significant 2-vessel coronary artery disease, including diffuse proximal/mid LAD disease of up to 95%, diffuse distal LAD disease of up to 75%, and 60% proximal RCA in-stent restenosis (FFR 0.75). 2. Mildly elevated left ventricular filling pressure (LVEDP 20 mmHg). 3. Basal inferior akinesis with otherwise preserved left ventricular contraction (LVEF 55-65%). 4. Successful FFR-guided balloon angioplasty of ostial/proximal RCA using 2.75 x 15 mm non-compliant balloon inflated to 20 atm. Final angiogram demonstrates <10% residual stenosis with TIMI-3 flow.  Recommendations: 1. Continue dual  antiplatelet therapy with aspirin and clopidogrel for at least 1 month, ideally indefinitely. 2. Aggressive secondary prevention, including consideration of PCSK9 inhibitor, given statin intolerance. 3. Medical therapy for diffuse disease involving D1 and distal LAD (small vessels).  Echo: Jan 2017:Study Conclusions  - Left ventricle: The cavity size was normal. There was mild concentric hypertrophy. Systolic function was normal. The estimated ejection fraction was in the range of 60% to 65%. Wall motion was normal; there were no regional wall motion abnormalities. Left ventricular diastolic function parameters were normal. - Mitral valve: There was mild regurgitation. - Atrial septum: No defect or patent foramen ovale was identified bu color flow Doppler. - Tricuspid valve: There was trivial regurgitation. - Inferior vena cava: The vessel was normal in size. The respirophasic diameter changes were in the normal range (>= 50%), consistent with normal central venous pressure. - Global longitudinal strain -18.6%.  Patient Profile     67 y.o. female with history of CAD s/p CABG in 2007. Subsequent failure of SVG to RCA. S/p extensive stenting of RCA in the past- last intervention in 2017 with PTCA of ostial RCA for restenosis. Now presents with progressive angina.  Assessment & Plan    1. Unstable angina. S/p CABG with graft failure to RCA. S/p full metal jacket to RCA. Restenosis of ostial RCA in 2017. S/p PTCA. She has ruled out for MI. Plan to repeat Cardiac cath today. She apparently developed rash on Plavix and stopped it. Will need to consider alternative antiplatelet therapy either with Effient or Brilinta.  2. HLD LDL 138 on Zetia and very low dose Crestor. Intolerant of higher doses. Needs close follow up in lipid clinic for [place] 9 therapy.  3. HTN improved this am.  4. History of CHB. Apparently pacemaker interrogated but I cannot find results.  5. History of  seizures 6. History of colon CA  For questions or updates, please contact [place] Please consult [email or web address] for contact info under Cardiology/STEMI.      Signed, Peter [name], MD  02/21/2018, 9:56 AM

## 2018-02-21 NOTE — ED Notes (Signed)
Cardiologist at bedside.  

## 2018-02-21 NOTE — Progress Notes (Signed)
Progress Note  Patient Name: Rachel Clark Date of Encounter: 02/21/2018  Primary Cardiologist: Peter [name], MD   Subjective   Feels well this am. No recurrent angina.   Inpatient Medications    Scheduled Meds: . acetaminophen  650 mg Oral Once  . amLODipine  10 mg Oral Daily  . aspirin  324 mg Oral NOW   Or  . aspirin  300 mg Rectal NOW  . aspirin EC  81 mg Oral Daily  . carbamazepine  600 mg Oral BID  . carvedilol  12.5 mg Oral BID WC  . DULoxetine  60 mg Oral Daily  . ethosuximide  500 mg Oral BID  . ezetimibe  10 mg Oral Daily  . levothyroxine  50 mcg Oral QAC breakfast  . memantine  10 mg Oral BID  . [START ON 02/22/2018] rosuvastatin  5 mg Oral Q M,W,F  . sodium chloride flush  3 mL Intravenous Q12H   Continuous Infusions: . sodium chloride    . sodium chloride 1 mL/kg/hr (02/21/18 0602)  . heparin 1,000 Units/hr (02/20/18 2133)  . nitroGLYCERIN Stopped (02/21/18 0923)   PRN Meds: sodium chloride, acetaminophen, acetaminophen, nitroGLYCERIN, ondansetron (ZOFRAN) IV, sodium chloride flush   Vital Signs    Vitals:   02/21/18 0744 02/21/18 0918 02/21/18 0920 02/21/18 0922  BP: (!) 118/57 95/63 94/64 94/64  Pulse: 75 76 69   Resp: _0 Temp:      TempSrc:      SpO2: 99% 94% 99%   Weight:      Height:       No intake or output data in the 24 hours ending 02/21/18 0956 Filed Weights   02/20/18 1855  Weight: 82.1 kg    Telemetry    NSR - Personally Reviewed  ECG    NSR with nonspecific ST T changes. - Personally Reviewed  Physical Exam   GEN: No acute distress.   Neck: No JVD Cardiac: RRR, no murmurs, rubs, or gallops.  Respiratory: Clear to auscultation bilaterally. GI: Soft, nontender, non-distended  MS: No edema; No deformity. Neuro:  Nonfocal  Psych: Normal affect   Labs    Chemistry Recent Labs  Lab 02/20/18 1055 02/20/18 2207 02/21/18 0254  NA 137  --  135  K 4.2  --  3.9  CL 102  --  104  CO2 26  --  21*    GLUCOSE 116*  --  94  BUN 14  --  17  CREATININE 0.74  --  0.67  CALCIUM 9.0  --  8.5*  PROT  --  6.6  --   ALBUMIN  --  3.4*  --   AST  --  15  --   ALT  --  17  --   ALKPHOS  --  72  --   BILITOT  --  0.6  --   GFRNONAA >60  --  >60  GFRAA >60  --  >60  ANIONGAP 9  --  10     Hematology Recent Labs  Lab 02/20/18 1055 02/21/18 0254  WBC 9.1 9.3  RBC 4.75 4.43  HGB 14.0 13.1  HCT 43.5 40.1  MCV 91.6 90.5  MCH 29.5 29.6  MCHC 32.2 32.7  RDW 12.6 12.6  PLT 296 269    Cardiac Enzymes Recent Labs  Lab 02/20/18 1948 02/21/18 0254  TROPONINI <0.03 <0.03    Recent Labs  Lab 02/20/18 1108  TROPIPOC 0.00  BNPNo results for input(s): BNP, PROBNP in the last 168 hours.   DDimer No results for input(s): DDIMER in the last 168 hours.   Radiology    Dg Chest 2 View  Result Date: 02/20/2018 CLINICAL DATA:  Chest pain.  CABG. EXAM: CHEST - 2 VIEW COMPARISON:  11/05/2015 FINDINGS: Heart size and pulmonary vascularity are normal. Lungs are clear except for minimal tenting of the left hemidiaphragm, stable. Pacemaker in place. Coronary artery stents. CABG. There is a new fracture of the distal most sternal wire. IMPRESSION: No active cardiopulmonary disease. Electronically Signed   By: Lorriane Shire M.D.   On: 02/20/2018 11:13    Cardiac Studies   Cath 06/29/16  Conclusions: 1. Significant 2-vessel coronary artery disease, including diffuse proximal/mid LAD disease of up to 95%, diffuse distal LAD disease of up to 75%, and 60% proximal RCA in-stent restenosis (FFR 0.75). 2. Mildly elevated left ventricular filling pressure (LVEDP 20 mmHg). 3. Basal inferior akinesis with otherwise preserved left ventricular contraction (LVEF 55-65%). 4. Successful FFR-guided balloon angioplasty of ostial/proximal RCA using 2.75 x 15 mm non-compliant balloon inflated to 20 atm. Final angiogram demonstrates <10% residual stenosis with TIMI-3 flow.  Recommendations: 1. Continue dual  antiplatelet therapy with aspirin and clopidogrel for at least 1 month, ideally indefinitely. 2. Aggressive secondary prevention, including consideration of PCSK9 inhibitor, given statin intolerance. 3. Medical therapy for diffuse disease involving D1 and distal LAD (small vessels).  Echo: Jan 2017:Study Conclusions  - Left ventricle: The cavity size was normal. There was mild concentric hypertrophy. Systolic function was normal. The estimated ejection fraction was in the range of 60% to 65%. Wall motion was normal; there were no regional wall motion abnormalities. Left ventricular diastolic function parameters were normal. - Mitral valve: There was mild regurgitation. - Atrial septum: No defect or patent foramen ovale was identified bu color flow Doppler. - Tricuspid valve: There was trivial regurgitation. - Inferior vena cava: The vessel was normal in size. The respirophasic diameter changes were in the normal range (>= 50%), consistent with normal central venous pressure. - Global longitudinal strain -18.6%.  Patient Profile     67 y.o. female with history of CAD s/p CABG in 2007. Subsequent failure of SVG to RCA. S/p extensive stenting of RCA in the past- last intervention in 2017 with PTCA of ostial RCA for restenosis. Now presents with progressive angina.  Assessment & Plan    1. Unstable angina. S/p CABG with graft failure to RCA. S/p full metal jacket to RCA. Restenosis of ostial RCA in 2017. S/p PTCA. She has ruled out for MI. Plan to repeat Cardiac cath today. She apparently developed rash on Plavix and stopped it. Will need to consider alternative antiplatelet therapy either with Effient or Brilinta.  2. HLD LDL 138 on Zetia and very low dose Crestor. Intolerant of higher doses. Needs close follow up in lipid clinic for [place] 9 therapy.  3. HTN improved this am.  4. History of CHB. Apparently pacemaker interrogated but I cannot find results.  5. History of  seizures 6. History of colon CA  For questions or updates, please contact [place] Please consult [email or web address] for contact info under Cardiology/STEMI.      Signed, Peter [name], MD  02/21/2018, 9:56 AM

## 2018-02-21 NOTE — ED Notes (Signed)
Pt ambulated to restroom. 

## 2018-02-21 NOTE — Progress Notes (Signed)
[facility] for heparin Indication: chest pain/ACS  Allergies  Allergen Reactions  . Aspirin Other (See Comments)    GI upset  . Erythromycin Nausea Only    Gi upset  . Lisinopril Cough  . Niacin And Related Other (See Comments)    Pt does not recall reactions   . Statins Other (See Comments)    Muscle pain  . Sulfa Drugs Cross Reactors Swelling  . Tetracyclines & Related Nausea Only  . Penicillins Nausea Only and Rash    Has patient had a PCN reaction causing immediate rash, facial/tongue/throat swelling, SOB or lightheadedness with hypotension: Yes Has patient had a PCN reaction causing severe rash involving mucus membranes or skin necrosis: No Has patient had a PCN reaction that required hospitalization No Has patient had a PCN reaction occurring within the last 10 years: No If all of the above answers are "NO", then may proceed with Cephalosporin use.   Marland Kitchen Plavix [Clopidogrel Bisulfate] Rash    Patient Measurements: Height: 5\' 9"  (175.3 cm) Weight: 181 lb (82.1 kg) IBW/kg (Calculated) : 66.2 Heparin Dosing Weight: 82.1 kg  Vital Signs: BP: 121/58 (08/22 0600) Pulse Rate: 89 (08/22 0600)  Labs: Recent Labs    02/20/18 1055 02/20/18 1948 02/21/18 0254  HGB 14.0  --  13.1  HCT 43.5  --  40.1  PLT 296  --  269  HEPARINUNFRC  --   --  0.33  CREATININE 0.74  --  0.67  TROPONINI  --  <0.03 <0.03    Estimated Creatinine Clearance: 77.1 mL/min (by C-G formula based on SCr of 0.67 mg/dL).  Assessment:  67 y.o. female with chest pain for heparin   Goal of Therapy:  Heparin level 0.3-0.7 units/ml Monitor platelets by anticoagulation protocol: Yes   Plan:  Continue Heparin at current rate  Follow up after cath today  Phillis Knack, PharmD, BCPS

## 2018-02-21 NOTE — Interval H&P Note (Signed)
Cath Lab Visit (complete for each Cath Lab visit)  Clinical Evaluation Leading to the Procedure:   ACS: Yes.    Non-ACS:    Anginal Classification: CCS III  Anti-ischemic medical therapy: Maximal Therapy (2 or more classes of medications)  Non-Invasive Test Results: No non-invasive testing performed  Prior CABG: Previous CABG      History and Physical Interval Note:  02/21/2018 6:07 PM  Rachel Clark  has presented today for surgery, with the diagnosis of cp  The various methods of treatment have been discussed with the patient and family. After consideration of risks, benefits and other options for treatment, the patient has consented to  Procedure(s): LEFT HEART CATH AND CORS/GRAFTS ANGIOGRAPHY (N/A) as a surgical intervention .  The patient's history has been reviewed, patient examined, no change in status, stable for surgery.  I have reviewed the patient's chart and labs.  Questions were answered to the patient's satisfaction.     Quay Burow

## 2018-02-21 NOTE — Progress Notes (Signed)
[facility] for heparin Indication: chest pain/ACS  Allergies  Allergen Reactions  . Aspirin Other (See Comments)    GI upset  . Erythromycin Nausea Only    Gi upset  . Lisinopril Cough  . Niacin And Related Other (See Comments)    Pt does not recall reactions   . Statins Other (See Comments)    Muscle pain  . Sulfa Drugs Cross Reactors Swelling  . Tetracyclines & Related Nausea Only  . Penicillins Nausea Only and Rash    Has patient had a PCN reaction causing immediate rash, facial/tongue/throat swelling, SOB or lightheadedness with hypotension: Yes Has patient had a PCN reaction causing severe rash involving mucus membranes or skin necrosis: No Has patient had a PCN reaction that required hospitalization No Has patient had a PCN reaction occurring within the last 10 years: No If all of the above answers are "NO", then may proceed with Cephalosporin use.   Marland Kitchen Plavix [Clopidogrel Bisulfate] Rash    Patient Measurements: Height: 5\' 9"  (175.3 cm) Weight: 181 lb (82.1 kg) IBW/kg (Calculated) : 66.2 Heparin Dosing Weight: 82.1 kg  Vital Signs: BP: 120/68 (08/22 1000) Pulse Rate: 67 (08/22 1000)  Labs: Recent Labs    02/20/18 1055 02/20/18 1948 02/21/18 0254 02/21/18 1058  HGB 14.0  --  13.1  --   HCT 43.5  --  40.1  --   PLT 296  --  269  --   HEPARINUNFRC  --   --  0.33 <0.10*  CREATININE 0.74  --  0.67  --   TROPONINI  --  <0.03 <0.03  --     Estimated Creatinine Clearance: 77.1 mL/min (by C-G formula based on SCr of 0.67 mg/dL).  Assessment:  67 y.o. female with chest pain for heparin.  Known hx CAD, planning cath lab this afternoon.  Heparin level this morning now subtherapeutic.  Confirmed with RN, heparin gtt still infusing appropriately.  Goal of Therapy:  Heparin level 0.3-0.7 units/ml Monitor platelets by anticoagulation protocol: Yes   Plan:  Increase IV heparin to 1200 units/hr. Repeat heparin level in 6  hrs. Follow up after cath today  Marguerite Olea, [facility] [place] Clinical Pharmacist Phone [phone]  02/21/2018 12:42 PM

## 2018-02-22 ENCOUNTER — Encounter (HOSPITAL_COMMUNITY): Payer: Self-pay | Admitting: Cardiovascular Disease

## 2018-02-22 LAB — CBC
HEMATOCRIT: 41.1 % (ref 36.0–46.0)
HEMOGLOBIN: 13.8 g/dL (ref 12.0–15.0)
MCH: 30.2 pg (ref 26.0–34.0)
MCHC: 33.6 g/dL (ref 30.0–36.0)
MCV: 89.9 fL (ref 78.0–100.0)
Platelets: 255 10*3/uL (ref 150–400)
RBC: 4.57 MIL/uL (ref 3.87–5.11)
RDW: 12.3 % (ref 11.5–15.5)
WBC: 7.7 10*3/uL (ref 4.0–10.5)

## 2018-02-22 LAB — BASIC METABOLIC PANEL
Anion gap: 7 (ref 5–15)
BUN: 11 mg/dL (ref 8–23)
CHLORIDE: 106 mmol/L (ref 98–111)
CO2: 23 mmol/L (ref 22–32)
Calcium: 8.4 mg/dL — ABNORMAL LOW (ref 8.9–10.3)
Creatinine, Ser: 0.61 mg/dL (ref 0.44–1.00)
GFR calc Af Amer: >60 mL/min (ref >60)
GFR calc non Af Amer: >60 mL/min (ref >60)
Glucose, Bld: 94 mg/dL (ref 70–99)
POTASSIUM: 4 mmol/L (ref 3.5–5.1)
Sodium: 136 mmol/L (ref 135–145)

## 2018-02-22 MED ORDER — NITROGLYCERIN 0.4 MG SL SUBL: 0.4000 mg | SUBLINGUAL_TABLET | SUBLINGUAL | 6 refills | Status: DC | PRN

## 2018-02-22 NOTE — Discharge Summary (Signed)
Discharge Summary    Patient ID: Rachel Clark,  MRN: [id number], DOB/AGE: 1951-05-24 67 y.o.  Admit date: 02/20/2018 Discharge date: 02/22/2018  Primary Care Provider: Harlan Stains Primary Cardiologist: Peter [name], MD  Discharge Diagnoses    Active Problems:   Unstable angina ([place])   CAD   HTN   HLD  Allergies Allergies  Allergen Reactions  . Aspirin Other (See Comments)    GI upset- can tolerate 81 mg ASA, just not full doses  . Erythromycin Nausea Only    Gi upset  . Lisinopril Cough  . Niacin And Related Other (See Comments)    Pt does not recall reactions   . Statins Other (See Comments)    Muscle pain  . Sulfa Drugs Cross Reactors Swelling  . Tetracyclines & Related Nausea Only  . Penicillins Nausea Only and Rash    Has patient had a PCN reaction causing immediate rash, facial/tongue/throat swelling, SOB or lightheadedness with hypotension: Yes Has patient had a PCN reaction causing severe rash involving mucus membranes or skin necrosis: No Has patient had a PCN reaction that required hospitalization No Has patient had a PCN reaction occurring within the last 10 years: No If all of the above answers are "NO", then may proceed with Cephalosporin use.   Marland Kitchen Plavix [Clopidogrel Bisulfate] Rash    Diagnostic Studies/Procedures    LEFT HEART CATH AND CORS/GRAFTS ANGIOGRAPHY  Conclusion     Previously placed Ost RCA to Mid RCA stent (unknown type) is widely patent.  Prox LAD lesion is 100% stenosed.  Origin lesion is 100% stenosed.  The left ventricular systolic function is normal.  LV end diastolic pressure is normal.  The left ventricular ejection fraction is 55-65% by visual estimate.   IMPRESSION:Rachel Clark has unchanged anatomy compared to her left calf performed by Dr. Saunders Revel. Her RCA reveals widely patent stents throughout especially at the previously angioplasty site in the proximal segment. The LAD is total with a patent LIMA to the  LAD. The vein graft to the right is occluded as it was in the past at the aorta. She has a large ramus branch which is widely patent and a small diminutive circumflex which is unchanged and moderately diseased. I am unsure the etiology of her progressive angina. There are no culprit lesions that would be responsible for this. I performed right common femoral angiography and then [facility] closed her right common femoral puncture site achieving hemostasis. The patient left the lab in stable condition.    History of Present Illness     67 y.o. female with hx of CAD s/p CABG and subsequent failure of SVG to RCA S/p extensive stenting of RCA in the past- last intervention in 2017 with PTCA of ostial RCA for restenosis, CHB s/p PPM (followed by Dr. Rayann Heman), hypothyroidism, anemia and HLD presented for progressive angina.   She apparently developed rash on Plavix and stopped it despite lifelong recommendation of DAPT.   He developed chest pain at work and went home 8/20. The air conditioning was out and she though she was too hot.  She took NTG and pain resolved.  It was associated with dyspnea.  She felt tired rest of night and did not eat much.  AM of 8/21,she woke feeling her usual but again at work developed chest pain and went home and took NTG.  Again pain improved but she did continued with pain off and on along with nausea leading to further evaluation.  EKG demonstrated SR with non specific ST abnormalities.   Troponin poc 0.00, Na 137, K+ 4.2, BUN 14 and Cr 0.74  Hgb 14, Plts 296    Hospital Course     Consultants: None  1. Unstable angina - Troponin remained negative. Cath showed stable anatomy with patent RCA stents and patent LIMA to LAD. No culprit lesions. She thinks her pain is likely due to stress/anxiety. No recurrent pain.  - Continue ASA, Imdur and BB.  2. HLD - 02/21/2018: Cholesterol 229; HDL 72; LDL Cholesterol 138; Triglycerides 97; VLDL 19  - Continue low Crestor 5mg  3  times/week and Zetia. She will need lipid clinic referal as outpatient for [facility] therapy.   3. HTN - BP stable on current medications.   Discharge Vitals Blood pressure 135/76, pulse 67, temperature (!) 97.5 F (36.4 C), temperature source Oral, resp. rate 17, height 5\' 9"  (1.753 m), weight 86.6 kg, SpO2 100 %.  Filed Weights   02/20/18 1855 02/21/18 1407 02/22/18 0604  Weight: 82.1 kg 86.8 kg 86.6 kg    Labs & Radiologic Studies    CBC Recent Labs    02/21/18 0254 02/22/18 0411  WBC 9.3 7.7  HGB 13.1 13.8  HCT 40.1 41.1  MCV 90.5 89.9  PLT 269 254   Basic Metabolic Panel Recent Labs    02/20/18 2207 02/21/18 0254 02/22/18 0411  NA  --  135 136  K  --  3.9 4.0  CL  --  104 106  CO2  --  21* 23  GLUCOSE  --  94 94  BUN  --  17 11  CREATININE  --  0.67 0.61  CALCIUM  --  8.5* 8.4*  MG 2.0  --   --    Liver Function Tests Recent Labs    02/20/18 2207  AST 15  ALT 17  ALKPHOS 72  BILITOT 0.6  PROT 6.6  ALBUMIN 3.4*   Cardiac Enzymes Recent Labs    02/20/18 1948 02/21/18 0254  TROPONINI <0.03 <0.03   Hemoglobin A1C Recent Labs    02/20/18 2207  HGBA1C 5.5   Fasting Lipid Panel Recent Labs    02/21/18 0254  CHOL 229*  HDL 72  LDLCALC 138*  TRIG 97  CHOLHDL 3.2   Thyroid Function Tests Recent Labs    02/20/18 2207  TSH 1.206   _____________  Dg Chest 2 View  Result Date: 02/20/2018 CLINICAL DATA:  Chest pain.  CABG. EXAM: CHEST - 2 VIEW COMPARISON:  11/05/2015 FINDINGS: Heart size and pulmonary vascularity are normal. Lungs are clear except for minimal tenting of the left hemidiaphragm, stable. Pacemaker in place. Coronary artery stents. CABG. There is a new fracture of the distal most sternal wire. IMPRESSION: No active cardiopulmonary disease. Electronically Signed   By: Lorriane Shire M.D.   On: 02/20/2018 11:13   Disposition   Pt is being discharged home today in good condition.  Follow-up Plans & Appointments    Follow-up  Information    [name], [place]. Go on 03/14/2018.   Specialties:  Physician Assistant, Cardiology, Radiology Why:  @8am  for hospital follow up with Dr. Doug Sou PA Contact information: [address] [place] [address] [phone]          Discharge Instructions    Diet - low sodium heart healthy   Complete by:  As directed    Discharge instructions   Complete by:  As directed    No driving for 48  hours. No lifting over 5 lbs for 1 week. No sexual activity for 1 week. Keep procedure site clean & dry. If you notice increased pain, swelling, bleeding or pus, call/return!  You may shower, but no soaking baths/hot tubs/pools for 1 week.   Increase activity slowly   Complete by:  As directed       Discharge Medications   Allergies as of 02/22/2018      Reactions   Aspirin Other (See Comments)   GI upset- can tolerate 81 mg ASA, just not full doses   Erythromycin Nausea Only   Gi upset   Lisinopril Cough   Niacin And Related Other (See Comments)   Pt does not recall reactions    Statins Other (See Comments)   Muscle pain   Sulfa Drugs Cross Reactors Swelling   Tetracyclines & Related Nausea Only   Penicillins Nausea Only, Rash   Has patient had a PCN reaction causing immediate rash, facial/tongue/throat swelling, SOB or lightheadedness with hypotension: Yes Has patient had a PCN reaction causing severe rash involving mucus membranes or skin necrosis: No Has patient had a PCN reaction that required hospitalization No Has patient had a PCN reaction occurring within the last 10 years: No If all of the above answers are "NO", then may proceed with Cephalosporin use.   Plavix [clopidogrel Bisulfate] Rash      Medication List    TAKE these medications   acetaminophen 500 MG tablet Commonly known as:  TYLENOL Take 500-1,000 mg by mouth daily as needed for mild pain or headache.   amLODipine 10 MG tablet Commonly known as:  NORVASC Take 10 mg by mouth  daily.   aspirin 81 MG chewable tablet Chew 1 tablet (81 mg total) by mouth daily.   Azelastine HCl 0.15 % Soln Place 1 spray into both nostrils daily as needed (seasonal allergies).   carbamazepine 200 MG 12 hr tablet Commonly known as:  TEGRETOL XR TAKE 3 TABLETS TWICE A DAY What changed:    how much to take  how to take this  when to take this  additional instructions   carvedilol 12.5 MG tablet Commonly known as:  COREG TAKE 1 TABLET (12.5 MG TOTAL) BY MOUTH 2 (TWO) TIMES DAILY WITH A MEAL.   DULoxetine 60 MG capsule Commonly known as:  CYMBALTA Take 60 mg by mouth daily.   estradiol 0.05 MG/24HR patch Commonly known as:  VIVELLE-DOT Place 1 patch onto the skin 2 (two) times a week. Sunday, Wednesday   ethosuximide 250 MG capsule Commonly known as:  ZARONTIN Take 2 capsule (500mg ) in the morning, , and 2 capsules (500mg ) at night What changed:    how much to take  how to take this  when to take this  additional instructions   ezetimibe 10 MG tablet Commonly known as:  ZETIA Take 1 tablet (10 mg total) by mouth daily.   ibuprofen 600 MG tablet Commonly known as:  ADVIL,MOTRIN Take 1 tablet (600 mg total) by mouth every 8 (eight) hours as needed. What changed:  reasons to take this   isosorbide mononitrate 30 MG 24 hr tablet Commonly known as:  IMDUR Take 1 tablet (30 mg total) by mouth daily.   levothyroxine 50 MCG tablet Commonly known as:  SYNTHROID, LEVOTHROID Take 50 mcg by mouth daily.   memantine 10 MG tablet Commonly known as:  NAMENDA TAKE 1 TABLET BY MOUTH TWICE A DAY   nitroGLYCERIN 0.4 MG SL tablet Commonly known as:  NITROSTAT  Place 1 tablet (0.4 mg total) under the tongue every 5 (five) minutes as needed. For chest pain. What changed:    reasons to take this  additional instructions   rosuvastatin 5 MG tablet Commonly known as:  CRESTOR Take 1 tablet by mouth up to 3 times weekly as directed        Acute coronary  syndrome (MI, NSTEMI, STEMI, etc) this admission?: No.    Outstanding Labs/Studies   No  Duration of Discharge Encounter   Greater than 30 minutes including physician time.  Jarrett Soho, PA 02/22/2018, 10:26 AM

## 2018-02-22 NOTE — Care Management (Signed)
02-22-18 BENEFITS CHECK :  # 3.  S/W  [facility]  @ [employer] # [phone]  1. ELIQUIS  5 MG BID COVER- YES CO-PAY- $ 78.40  (preferred ) TIER- 3 DRUG PRIOR APPROVAL- NO  2. ELIQUIS  2.5 MG BID COVER- YES CO-PAY- $ 78.40  (preferred ) TIER- 3 DRUG PRIOR APPROVAL- NO  APIXABAN : NONE FORMULARY  3. BRILINTA  90 MG BID COVER- YES CO-PAY- $ 2,112.00 ( none preferred ) TIER- 4 DRUG PRIOR APPROVAL- NO  NO DEDUCTIBLE  PREFERRED PHARMACY : YES     [facility] 90 DAY SUPPLY FOR M/0 $ 245.75

## 2018-02-22 NOTE — Progress Notes (Signed)
Progress Note  Patient Name: GENEVEIVE FURNESS Date of Encounter: 02/22/2018  Primary Cardiologist: Peter [name], MD   Subjective   Feeling well. No chest pain, sob or palpitations.   Inpatient Medications    Scheduled Meds: . acetaminophen  650 mg Oral Once  . amLODipine  10 mg Oral Daily  . aspirin  81 mg Oral Daily  . carbamazepine  600 mg Oral BID  . carvedilol  12.5 mg Oral BID WC  . DULoxetine  60 mg Oral Daily  . ethosuximide  500 mg Oral BID  . ezetimibe  10 mg Oral Daily  . levothyroxine  50 mcg Oral QAC breakfast  . memantine  10 mg Oral BID  . rosuvastatin  5 mg Oral Q M,W,F  . sodium chloride flush  3 mL Intravenous Q12H   Continuous Infusions: . sodium chloride    . nitroGLYCERIN Stopped (02/21/18 0923)   PRN Meds: sodium chloride, acetaminophen, acetaminophen, morphine injection, nitroGLYCERIN, ondansetron (ZOFRAN) IV, sodium chloride flush   Vital Signs    Vitals:   02/21/18 1900 02/22/18 0014 02/22/18 0604 02/22/18 0810  BP:  (!) 108/92 137/62 135/76  Pulse:  72 78 67  Resp: 15  17   Temp: 97.7 F (36.5 C) 98.1 F (36.7 C) 98 F (36.7 C) (!) 97.5 F (36.4 C)  TempSrc: Oral Oral Oral Oral  SpO2:  96% 97% 100%  Weight:   86.6 kg   Height:        Intake/Output Summary (Last 24 hours) at 02/22/2018 0854 Last data filed at 02/22/2018 5427 Gross per 24 hour  Intake 1580.34 ml  Output 150 ml  Net 1430.34 ml   Filed Weights   02/20/18 1855 02/21/18 1407 02/22/18 0604  Weight: 82.1 kg 86.8 kg 86.6 kg    Telemetry    SR  ECG  N/A  Physical Exam   GEN: No acute distress.   Neck: No JVD Cardiac: RRR, no murmurs, rubs, or gallops. Stable R radial. Respiratory: Clear to auscultation bilaterally. GI: Soft, nontender, non-distended  MS: No edema; No deformity. Neuro:  Nonfocal  Psych: Normal affect   Labs    Chemistry Recent Labs  Lab 02/20/18 1055 02/20/18 2207 02/21/18 0254 02/22/18 0411  NA 137  --  135 136  K 4.2  --   3.9 4.0  CL 102  --  104 106  CO2 26  --  21* 23  GLUCOSE 116*  --  94 94  BUN 14  --  17 11  CREATININE 0.74  --  0.67 0.61  CALCIUM 9.0  --  8.5* 8.4*  PROT  --  6.6  --   --   ALBUMIN  --  3.4*  --   --   AST  --  15  --   --   ALT  --  17  --   --   ALKPHOS  --  72  --   --   BILITOT  --  0.6  --   --   GFRNONAA >60  --  >60 >60  GFRAA >60  --  >60 >60  ANIONGAP 9  --  10 7     Hematology Recent Labs  Lab 02/20/18 1055 02/21/18 0254 02/22/18 0411  WBC 9.1 9.3 7.7  RBC 4.75 4.43 4.57  HGB 14.0 13.1 13.8  HCT 43.5 40.1 41.1  MCV 91.6 90.5 89.9  MCH 29.5 29.6 30.2  MCHC 32.2 32.7 33.6  RDW 12.6 12.6 12.3  PLT 296 269 255    Cardiac Enzymes Recent Labs  Lab 02/20/18 1948 02/21/18 0254  TROPONINI <0.03 <0.03    Recent Labs  Lab 02/20/18 1108  [place] 0.00      Radiology    Dg Chest 2 View  Result Date: 02/20/2018 CLINICAL DATA:  Chest pain.  CABG. EXAM: CHEST - 2 VIEW COMPARISON:  11/05/2015 FINDINGS: Heart size and pulmonary vascularity are normal. Lungs are clear except for minimal tenting of the left hemidiaphragm, stable. Pacemaker in place. Coronary artery stents. CABG. There is a new fracture of the distal most sternal wire. IMPRESSION: No active cardiopulmonary disease. Electronically Signed   By: Lorriane Shire M.D.   On: 02/20/2018 11:13    Cardiac Studies   LEFT HEART CATH AND CORS/GRAFTS ANGIOGRAPHY  Conclusion     Previously placed Ost RCA to Mid RCA stent (unknown type) is widely patent.  Prox LAD lesion is 100% stenosed.  Origin lesion is 100% stenosed.  The left ventricular systolic function is normal.  LV end diastolic pressure is normal.  The left ventricular ejection fraction is 55-65% by visual estimate.    IMPRESSION:Ms Delker has unchanged anatomy compared to her left calf performed by Dr. Saunders Revel.  Her RCA reveals widely patent stents throughout especially at the previously angioplasty site in the proximal segment.  The  LAD is total with a patent LIMA to the LAD.  The vein graft to the right is occluded as it was in the past at the aorta.  She has a large ramus branch which is widely patent and a small diminutive circumflex which is unchanged and moderately diseased.  I am unsure the etiology of her progressive angina.  There are no culprit lesions that would be responsible for this.  I performed right common femoral angiography and then [facility] closed her right common femoral puncture site achieving hemostasis.  The patient left the lab in stable condition.   Patient Profile     67 y.o. female with hx of CAD s/p CABG and subsequent failure of SVG to RCA S/p extensive stenting of RCA in the past- last intervention in 2017 with PTCA of ostial RCA for restenosis and HLD presented for progressive angina.   She apparently developed rash on Plavix and stopped it.  Assessment & Plan    1. Unstable angina - Troponin negative. Cath showed stable anatomy with patent RCA stents and patent LIMA to LAD. No culprit lesions.  - She thinks her pain is likely due to stress/anxiety.  - Continue ASA and BB. Home Imdur on hold here, ? Resume at discharge.   2. HLD - 02/21/2018: Cholesterol 229; HDL 72; LDL Cholesterol 138; Triglycerides 97; VLDL 19  - Continue low Crestor 5mg  3 times/week and Zetia. Needs close follow up in lipid clinic for [place] 9 therapy.   3. HTN - BP stable on current medications.    For questions or updates, please contact [facility] Please consult [email or web address] for contact info under Cardiology/STEMI.      Jarrett Soho, PA  02/22/2018, 8:54 AM

## 2018-02-23 ENCOUNTER — Emergency Department (HOSPITAL_COMMUNITY)
Admission: EM | Admit: 2018-02-23 | Discharge: 2018-02-23 | Disposition: A | Discharge status: Home / Self Care | Payer: Medicare Other | Attending: Emergency Medicine | Admitting: Emergency Medicine

## 2018-02-23 ENCOUNTER — Emergency Department (HOSPITAL_COMMUNITY): Payer: Medicare Other

## 2018-02-23 ENCOUNTER — Encounter (HOSPITAL_COMMUNITY): Payer: Self-pay | Admitting: Emergency Medicine

## 2018-02-23 DIAGNOSIS — I10 Essential (primary) hypertension: Secondary | ICD-10-CM | POA: Diagnosis not present

## 2018-02-23 DIAGNOSIS — Z7982 Long term (current) use of aspirin: Secondary | ICD-10-CM | POA: Insufficient documentation

## 2018-02-23 DIAGNOSIS — E039 Hypothyroidism, unspecified: Secondary | ICD-10-CM | POA: Insufficient documentation

## 2018-02-23 DIAGNOSIS — Z79899 Other long term (current) drug therapy: Secondary | ICD-10-CM | POA: Insufficient documentation

## 2018-02-23 DIAGNOSIS — I259 Chronic ischemic heart disease, unspecified: Secondary | ICD-10-CM | POA: Insufficient documentation

## 2018-02-23 DIAGNOSIS — Z87891 Personal history of nicotine dependence: Secondary | ICD-10-CM | POA: Insufficient documentation

## 2018-02-23 DIAGNOSIS — R0789 Other chest pain: Secondary | ICD-10-CM | POA: Insufficient documentation

## 2018-02-23 DIAGNOSIS — R079 Chest pain, unspecified: Secondary | ICD-10-CM | POA: Diagnosis not present

## 2018-02-23 LAB — I-STAT TROPONIN, ED: Troponin i, poc: 0.02 ng/mL (ref 0.00–0.08)

## 2018-02-23 LAB — CBC
HCT: 44.2 % (ref 36.0–46.0)
Hemoglobin: 14.5 g/dL (ref 12.0–15.0)
MCH: 29.7 pg (ref 26.0–34.0)
MCHC: 32.8 g/dL (ref 30.0–36.0)
MCV: 90.4 fL (ref 78.0–100.0)
Platelets: 310 10*3/uL (ref 150–400)
RBC: 4.89 MIL/uL (ref 3.87–5.11)
RDW: 12.3 % (ref 11.5–15.5)
WBC: 9.2 10*3/uL (ref 4.0–10.5)

## 2018-02-23 LAB — BASIC METABOLIC PANEL
Anion gap: 8 (ref 5–15)
BUN: 15 mg/dL (ref 8–23)
CO2: 26 mmol/L (ref 22–32)
Calcium: 8.7 mg/dL — ABNORMAL LOW (ref 8.9–10.3)
Chloride: 100 mmol/L (ref 98–111)
Creatinine, Ser: 0.71 mg/dL (ref 0.44–1.00)
GFR calc Af Amer: >60 mL/min (ref >60)
GFR calc non Af Amer: >60 mL/min (ref >60)
Glucose, Bld: 134 mg/dL — ABNORMAL HIGH (ref 70–99)
Potassium: 4.2 mmol/L (ref 3.5–5.1)
Sodium: 134 mmol/L — ABNORMAL LOW (ref 135–145)

## 2018-02-23 NOTE — ED Triage Notes (Signed)
Pt presents to ED for assessment of centralized chest pain starting a few hours ago.  Patient states she had taken 2 nitros with some relief of symptoms, but 324 ASA.  Extensive cardiac hx.  Pt states symptoms similar to previous MI.

## 2018-02-23 NOTE — ED Notes (Signed)
Discharge instructions discussed with Pt. Pt verbalized understanding. Pt stable and ambulatory.    

## 2018-02-25 ENCOUNTER — Telehealth: Payer: Self-pay | Admitting: Cardiology

## 2018-02-25 DIAGNOSIS — E785 Hyperlipidemia, unspecified: Secondary | ICD-10-CM

## 2018-02-25 NOTE — Telephone Encounter (Signed)
Returned call to patient no answer.Left message on personal voice mail I will put a Lipid Clinic order in.Scheduler will call back tomorrow with a appointment.

## 2018-02-25 NOTE — Telephone Encounter (Signed)
New Message   Patient is calling because she states that she was told by Dr. [name] that she needed to be referred to the lipid clinic. Not showing an order or a referral. Please call to discuss.

## 2018-02-26 ENCOUNTER — Telehealth: Payer: Self-pay | Admitting: Cardiology

## 2018-02-26 NOTE — Telephone Encounter (Signed)
Called patient and LVM to call back to schedule lipid clinic appt with the pharmacist.

## 2018-03-02 NOTE — ED Provider Notes (Signed)
[facility] EMERGENCY DEPARTMENT Provider Note   CSN: [id number] Arrival date & time: 02/23/18  1534     History   Chief Complaint Chief Complaint  Patient presents with  . Chest Pain    HPI CARLINE DURA is a 67 y.o. female.  HPI   67 year old female with chest pain.  She was just admitted for the same and discharged on 8/23.  She had cardiac angiography which was essentially unchanged from prior catheterization.  He did not identify any obvious culprit lesions.  She continues to have similar pain asked that she was having then.  No fevers or chills.  No cough.  Mild dyspnea.  No unusual leg pain or swelling.  Past Medical History:  Diagnosis Date  . Arthritis    "fingers" (06/29/2016)  . Chronic lower back pain   . Colon cancer ([place]) 12/04/2011   s/p Laparoscopic-assisted transverse colectomy on 12/19/2011 by Dr. Donne Hazel.  pT3 N0 M0.   . Coronary artery disease    a. remote NSTEMI with PCI to the RCA; b. S/P CABG in 2007; c. 2010 - 2 v CAD with L-LAD and S-RCA patent, small caliber Dx 70% (treated medically - no amenable to PCI), CFX free of significant disease, normal LVF. d. 10/2014 Lexi MV: EF 61%, no ischemia/infarct.  . Depression   . Dyslipidemia   . Fibromyalgia   . Gastric ulcer   . GERD (gastroesophageal reflux disease)   . 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 Headache    "weekly" (06/29/2016)  . Hyperlipidemia   . Hypothyroid   . Iron deficiency anemia 11/17/2011  . Memory change 11/13/2016  . Monoclonal gammopathy of unknown significance   . Myocardial infarction ([place]) 2007  . Presence of permanent cardiac pacemaker   . Syncope and collapse    pacemaker implanted  . Unstable angina [facility])     Patient Active Problem List   Diagnosis Date Noted  . Memory change 11/13/2016  . Acute bronchitis 03/23/2016  . Chest pain with high risk for cardiac etiology 11/05/2015    . Essential hypertension 08/25/2015  . Sick sinus syndrome ([place]) 08/16/2015  . Syncope and collapse 08/10/2015  . CAD, multiple vessel   . Chest pain   . Cardiac pacemaker in situ 07/25/2015  . Angina pectoris ([place]) 03/24/2015  . Unstable angina ([place]) 10/26/2014  . Seizures ([place]) 02/19/2014  . Lung nodule 02/06/2013  . Thyroid nodule 02/06/2013  . History of colon cancer 12/04/2011  . GERD (gastroesophageal reflux disease) 08/17/2011  . Monoclonal gammopathy of unknown significance   . Hypothyroid   . Coronary artery disease, post CABG 2007    . Dyslipidemia     Past Surgical History:  Procedure Laterality Date  . ANTERIOR CERVICAL DECOMP/DISCECTOMY FUSION  1980's  . BACK SURGERY    . CARDIAC CATHETERIZATION  05/20/2009   obstructive native vessel disease in LAD, RCA, and first diagonal, patent vein graft to distal RCA and LIMA to LAD,normal. ef 60%  . CARDIAC CATHETERIZATION N/A 03/24/2015   Procedure: Left Heart Cath and Cors/Grafts Angiography;  Surgeon: Wellington Hampshire, MD;  Location: [facility] CV LAB;  Service: Cardiovascular;  Laterality: N/A;  . CARDIAC CATHETERIZATION N/A 07/28/2015   Procedure: Left Heart Cath and Coronary Angiography;  Surgeon: Troy Sine, MD;  Location: [facility] CV LAB;  Service: Cardiovascular;  Laterality: N/A;  . CARDIAC CATHETERIZATION N/A 11/09/2015   Procedure:  Coronary Stent Intervention;  Surgeon: Sherren Mocha, MD;  Location: [place] CV LAB;  Service: Cardiovascular;  Laterality: N/A;  . CARDIAC CATHETERIZATION N/A 11/09/2015   Procedure: Left Heart Cath and Coronary Angiography;  Surgeon: Sherren Mocha, MD;  Location: [place] [facility] CV LAB;  Service: Cardiovascular;  Laterality: N/A;  . CARDIAC CATHETERIZATION N/A 06/29/2016   Procedure: Left Heart Cath and Coronary Angiography;  Surgeon: Nelva Bush, MD;  Location: [place] CV LAB;  Service: Cardiovascular;  Laterality: N/A;  . CARDIAC CATHETERIZATION N/A 06/29/2016    Procedure: Intravascular Pressure Wire/FFR Study;  Surgeon: Nelva Bush, MD;  Location: [facility] CV LAB;  Service: Cardiovascular;  Laterality: N/A;  . CARDIAC CATHETERIZATION N/A 06/29/2016   Procedure: Coronary Balloon Angioplasty;  Surgeon: Nelva Bush, MD;  Location: [place] CV LAB;  Service: Cardiovascular;  Laterality: N/A;  . COLON RESECTION  12/19/2011   Procedure: COLON RESECTION LAPAROSCOPIC;  Surgeon: Rolm Bookbinder, MD;  Location: [facility];  Service: General;  Laterality: N/A;  laparoscopic hand assisted partial colon resection  . COLON SURGERY    . COLONOSCOPY    . CORONARY ANGIOPLASTY WITH STENT PLACEMENT  ~ 2007   1 stent  . CORONARY ARTERY BYPASS GRAFT  2007   "CABG X2"  . DILATION AND CURETTAGE OF UTERUS  1973  . EP IMPLANTABLE DEVICE N/A 03/25/2015   MDT Advisa DR pacemaker implanted by Dr Rayann Heman for transient complete heart block and syncope  . INSERT / REPLACE / REMOVE PACEMAKER    . LEFT HEART CATH AND CORS/GRAFTS ANGIOGRAPHY N/A 02/21/2018   Procedure: LEFT HEART CATH AND CORS/GRAFTS ANGIOGRAPHY;  Surgeon: Lorretta Harp, MD;  Location: [facility] CV LAB;  Service: Cardiovascular;  Laterality: N/A;  . PORT-A-CATH REMOVAL N/A 06/12/2013   Procedure: REMOVAL PORT-A-CATH;  Surgeon: Rolm Bookbinder, MD;  Location: [facility];  Service: General;  Laterality: N/A;  . PORTACATH PLACEMENT  06/04/2012   Procedure: INSERTION PORT-A-CATH;  Surgeon: Rolm Bookbinder, MD;  Location: [facility];  Service: General;  Laterality: N/A;  Insertion of port-a-cath   . POSTERIOR LAMINECTOMY / DECOMPRESSION CERVICAL SPINE  1990s  . VAGINAL HYSTERECTOMY       OB History   None      Home Medications    Prior to Admission medications   Medication Sig Start Date End Date Taking? Authorizing Provider  acetaminophen (TYLENOL) 500 MG tablet Take 500-1,000 mg by mouth daily as needed for mild pain or headache.    [provider]  amLODipine (NORVASC) 10  MG tablet Take 10 mg by mouth daily.     [provider]  aspirin 81 MG chewable tablet Chew 1 tablet (81 mg total) by mouth daily. 06/30/16   Arbutus Leas, NP  Azelastine HCl 0.15 % SOLN Place 1 spray into both nostrils daily as needed (seasonal allergies).  12/22/14   [provider]  carbamazepine (TEGRETOL XR) 200 MG 12 hr tablet TAKE 3 TABLETS TWICE A DAY Patient taking differently: Take 600 mg by mouth 2 (two) times daily.  02/11/18   Ward Givens, NP  carvedilol (COREG) 12.5 MG tablet TAKE 1 TABLET (12.5 MG TOTAL) BY MOUTH 2 (TWO) TIMES DAILY WITH A MEAL. 09/04/16   [name], Peter M, MD  DULoxetine (CYMBALTA) 60 MG capsule Take 60 mg by mouth daily.     [provider]  estradiol (VIVELLE-DOT) 0.05 MG/24HR patch Place 1 patch onto the skin 2 (two) times a week. Sunday, Wednesday    [provider]  ethosuximide (ZARONTIN) 250 MG capsule Take 2 capsule (500mg ) in the morning, , and 2 capsules (500mg ) at night Patient taking differently: Take 500 mg by mouth 2 (two) times daily.  04/27/17   Kathrynn Ducking, MD  ezetimibe (ZETIA) 10 MG tablet Take 1 tablet (10 mg total) by mouth daily. 12/20/16   [name], Peter M, MD  ibuprofen (ADVIL,MOTRIN) 600 MG tablet Take 1 tablet (600 mg total) by mouth every 8 (eight) hours as needed. Patient taking differently: Take 600 mg by mouth every 8 (eight) hours as needed for headache or mild pain.  04/07/17   Jola Schmidt, MD  isosorbide mononitrate (IMDUR) 30 MG 24 hr tablet Take 1 tablet (30 mg total) by mouth daily. 01/23/18   [name], Peter M, MD  levothyroxine (SYNTHROID, LEVOTHROID) 50 MCG tablet Take 50 mcg by mouth daily.     [provider]  memantine (NAMENDA) 10 MG tablet TAKE 1 TABLET BY MOUTH TWICE A DAY Patient taking differently: Take 10 mg by mouth 2 (two) times daily.  11/06/17   Ward Givens, NP  nitroGLYCERIN (NITROSTAT) 0.4 MG SL tablet Place 1 tablet (0.4 mg total) under the tongue every 5 (five)  minutes as needed. For chest pain. 02/22/18   Leanor Kail, PA  rosuvastatin (CRESTOR) 5 MG tablet Take 1 tablet by mouth up to 3 times weekly as directed 07/06/16   [name], Peter M, MD    Family History Family History  Problem Relation Age of Onset  . Heart attack Father   . Heart disease Father   . Heart attack Brother 26  . Cancer Mother        lung  . Stroke Neg Hx     Social History Social History   Tobacco Use  . Smoking status: Former Smoker    Packs/day: 0.30    Years: 20.00    Pack years: 6.00    Types: Cigarettes    Last attempt to quit: 06/06/1981    Years since quitting: 36.7  . Smokeless tobacco: Never Used  Substance Use Topics  . Alcohol use: Yes    Alcohol/week: 1.0 standard drinks    Types: 1 Glasses of wine per week  . Drug use: No     Allergies   Aspirin; Erythromycin; Lisinopril; Niacin and related; Statins; Sulfa drugs cross reactors; Tetracyclines & related; Penicillins; and Plavix [clopidogrel bisulfate]   Review of Systems Review of Systems  All systems reviewed and negative, other than as noted in HPI.  Physical Exam Updated Vital Signs BP 119/77   Pulse 89   Temp 97.9 F (36.6 C) (Oral)   Resp 19   SpO2 100%   Physical Exam  Constitutional: She appears well-developed and well-nourished. No distress.  HENT:  Head: Normocephalic and atraumatic.  Eyes: Conjunctivae are normal. Right eye exhibits no discharge. Left eye exhibits no discharge.  Neck: Neck supple.  Cardiovascular: Normal rate, regular rhythm and normal heart sounds. Exam reveals no gallop and no friction rub.  No murmur heard. Pulmonary/Chest: Effort normal and breath sounds normal. No respiratory distress.  Abdominal: Soft. She exhibits no distension. There is no tenderness.  Musculoskeletal: She exhibits no edema or tenderness.  Neurological: She is alert.  Skin: Skin is warm and dry.  Psychiatric: She has a normal mood and affect. Her behavior is normal.  Thought content normal.  Nursing note and vitals reviewed.    ED Treatments / Results  Labs (all labs ordered are listed, but only abnormal results  are displayed) Labs Reviewed  BASIC METABOLIC PANEL - Abnormal; Notable for the following components:      Result Value   Sodium 134 (*)    Glucose, Bld 134 (*)    Calcium 8.7 (*)    All other components within normal limits  CBC  I-STAT TROPONIN, ED    EKG EKG Interpretation  Date/Time:  Saturday February 23 2018 15:39:45 EDT Ventricular Rate:  107 PR Interval:  190 QRS Duration: 90 QT Interval:  316 QTC Calculation: 421 R Axis:   55 Text Interpretation:  Sinus tachycardia Nonspecific ST abnormality Abnormal ECG Confirmed by Virgel Manifold [phone]) on 02/23/2018 4:39:53 PM   Radiology No results found.   Dg Chest 2 View  Result Date: 02/23/2018 CLINICAL DATA:  Chest pain. EXAM: CHEST - 2 VIEW COMPARISON:  Radiographs of February 20, 2018. FINDINGS: The heart size and mediastinal contours are within normal limits. Both lungs are clear. No pneumothorax or pleural effusion is noted. Status post coronary bypass graft. Left-sided pacemaker is unchanged in position. The visualized skeletal structures are unremarkable. IMPRESSION: No active cardiopulmonary disease. Electronically Signed   By: Marijo Conception, M.D.   On: 02/23/2018 16:02   Dg Chest 2 View  Result Date: 02/20/2018 CLINICAL DATA:  Chest pain.  CABG. EXAM: CHEST - 2 VIEW COMPARISON:  11/05/2015 FINDINGS: Heart size and pulmonary vascularity are normal. Lungs are clear except for minimal tenting of the left hemidiaphragm, stable. Pacemaker in place. Coronary artery stents. CABG. There is a new fracture of the distal most sternal wire. IMPRESSION: No active cardiopulmonary disease. Electronically Signed   By: Lorriane Shire M.D.   On: 02/20/2018 11:13    Procedures Procedures (including critical care time)  Medications Ordered in ED Medications - No data to  display   Initial Impression / Assessment and Plan / ED Course  I have reviewed the triage vital signs and the nursing notes.  Pertinent labs & imaging results that were available during my care of the patient were reviewed by me and considered in my medical decision making (see chart for details).    I have reviewed the triage vital signs and the nursing notes. Prior records were reviewed for additional information.    Pertinent labs & imaging results that were available during my care of the patient were reviewed by me and considered in my medical decision making (see chart for details).  67 year old female with chest pain.  She was just admitted for the same and discharged a couple days ago.  She did have a left heart catheterization.  This did not show any culprit lesion.  EKG is similar to priors.  Troponin is still normal.  I doubt that this is ACS.  Consider alternative emergent pathology such as PE, dissection, etc., but I doubt that.  Final Clinical Impressions(s) / ED Diagnoses   Final diagnoses:  Atypical chest pain    ED Discharge Orders    None      Virgel Manifold, MD 03/02/18 1153

## 2018-03-08 ENCOUNTER — Ambulatory Visit: Payer: Medicare Other

## 2018-03-08 NOTE — Progress Notes (Deleted)
Patient ID: Rachel Clark                 DOB: [birth year]                    MRN: [id number]     HPI: Rachel Clark is a 67 y.o. female patient referred to lipid clinic by Dr. [name]. PMH is significant for HLD, significant CAD S/p CABG (2007), HTN, seizure disorder, depression, and colon cancer. Referred to lipid clinic for consideration of [facility]CS[employer]-9 therapy d/t statin intolerance and consistently high LDL.   Current Medications:  Rosuvastatin 5 mg 3 times per week Ezetimibe 10 mg  Intolerances:  Pitavastatin 4 mg Pravastatin 40 mg  LDL goal: <70 mg/dL  Diet:   Exercise:   Family History: CAD and MI (father), cancer (mother), MI at 52 (brother)   Social History: Married with 2 children, former smoker (quit > 30 years ago), confirms alcohol use (unclear amount), denies drug use  Labs: 02/21/18:Total Chol 229 mg/dL, Trig 97 mg/dL, HDL 72 mg/dL, LDL 138 mg/dL  Past Medical History:  Diagnosis Date  . Arthritis    "fingers" (06/29/2016)  . Chronic lower back pain   . Colon cancer ([place]) 12/04/2011   s/p Laparoscopic-assisted transverse colectomy on 12/19/2011 by Dr. Donne Hazel.  pT3 N0 M0.   . Coronary artery disease    a. remote NSTEMI with PCI to the RCA; b. S/P CABG in 2007; c. 2010 - 2 v CAD with L-LAD and S-RCA patent, small caliber Dx 70% (treated medically - no amenable to PCI), CFX free of significant disease, normal LVF. d. 10/2014 Lexi MV: EF 61%, no ischemia/infarct.  . Depression   . Dyslipidemia   . Fibromyalgia   . Gastric ulcer   . GERD (gastroesophageal reflux disease)   . 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 Headache    "weekly" (06/29/2016)  . Hyperlipidemia   . Hypothyroid   . Iron deficiency anemia 11/17/2011  . Memory change 11/13/2016  . Monoclonal gammopathy of unknown significance   . Myocardial infarction ([place]) 2007  . Presence of permanent cardiac pacemaker   . Syncope and  collapse    pacemaker implanted  . Unstable angina [facility])     Current Outpatient Medications on File Prior to Visit  Medication Sig Dispense Refill  . acetaminophen (TYLENOL) 500 MG tablet Take 500-1,000 mg by mouth daily as needed for mild pain or headache.    Marland Kitchen amLODipine (NORVASC) 10 MG tablet Take 10 mg by mouth daily.     Marland Kitchen aspirin 81 MG chewable tablet Chew 1 tablet (81 mg total) by mouth daily.    . Azelastine HCl 0.15 % SOLN Place 1 spray into both nostrils daily as needed (seasonal allergies).   12  . carbamazepine (TEGRETOL XR) 200 MG 12 hr tablet TAKE 3 TABLETS TWICE A DAY (Patient taking differently: Take 600 mg by mouth 2 (two) times daily. ) 180 tablet 0  . carvedilol (COREG) 12.5 MG tablet TAKE 1 TABLET (12.5 MG TOTAL) BY MOUTH 2 (TWO) TIMES DAILY WITH A MEAL. 60 tablet 5  . DULoxetine (CYMBALTA) 60 MG capsule Take 60 mg by mouth daily.     Marland Kitchen estradiol (VIVELLE-DOT) 0.05 MG/24HR patch Place 1 patch onto the skin 2 (two) times a week. Sunday, Wednesday    . ethosuximide (ZARONTIN) 250 MG capsule Take 2 capsule (500mg ) in the morning, , and 2  capsules (500mg ) at night (Patient taking differently: Take 500 mg by mouth 2 (two) times daily. ) 360 capsule 3  . ezetimibe (ZETIA) 10 MG tablet Take 1 tablet (10 mg total) by mouth daily. 90 tablet 3  . ibuprofen (ADVIL,MOTRIN) 600 MG tablet Take 1 tablet (600 mg total) by mouth every 8 (eight) hours as needed. (Patient taking differently: Take 600 mg by mouth every 8 (eight) hours as needed for headache or mild pain. ) 15 tablet 0  . isosorbide mononitrate (IMDUR) 30 MG 24 hr tablet Take 1 tablet (30 mg total) by mouth daily. 60 tablet 0  . levothyroxine (SYNTHROID, LEVOTHROID) 50 MCG tablet Take 50 mcg by mouth daily.     . memantine (NAMENDA) 10 MG tablet TAKE 1 TABLET BY MOUTH TWICE A DAY (Patient taking differently: Take 10 mg by mouth 2 (two) times daily. ) 180 tablet 1  . nitroGLYCERIN (NITROSTAT) 0.4 MG SL tablet Place 1 tablet (0.4  mg total) under the tongue every 5 (five) minutes as needed. For chest pain. 25 tablet 6  . rosuvastatin (CRESTOR) 5 MG tablet Take 1 tablet by mouth up to 3 times weekly as directed 12 tablet 3   No current facility-administered medications on file prior to visit.     Allergies  Allergen Reactions  . Aspirin Other (See Comments)    GI upset- can tolerate 81 mg ASA, just not full doses  . Erythromycin Nausea Only    Gi upset  . Lisinopril Cough  . Niacin And Related Other (See Comments)    Pt does not recall reactions   . Statins Other (See Comments)    Muscle pain  . Sulfa Drugs Cross Reactors Swelling  . Tetracyclines & Related Nausea Only  . Penicillins Nausea Only and Rash    Has patient had a PCN reaction causing immediate rash, facial/tongue/throat swelling, SOB or lightheadedness with hypotension: Yes Has patient had a PCN reaction causing severe rash involving mucus membranes or skin necrosis: No Has patient had a PCN reaction that required hospitalization No Has patient had a PCN reaction occurring within the last 10 years: No If all of the above answers are "NO", then may proceed with Cephalosporin use.   Marland Kitchen Plavix [Clopidogrel Bisulfate] Rash    No problem-specific Assessment & Plan notes found for this encounter.  Jonette Eva PharmD Candidate September 6th, 2019

## 2018-03-12 NOTE — Progress Notes (Deleted)
Cardiology Office Note:    Date:  03/12/2018   ID:  Rachel Clark, DOB Jan 08, 1951, MRN [id number]  PCP:  Harlan Stains, MD  Cardiologist:  Peter [name], MD   Referring MD: Harlan Stains, MD   No chief complaint on file. ***  History of Present Illness:    Rachel Clark is a 67 y.o. female with a hx of CAD status post CABG with subsequent failure of the SVG to RCA, status post extensive stenting of the RCA in the past, last intervention in 2017 with PTCA of the ostial RCA for restenosis.  She also has a history of complete heart block status post pacemaker followed by Dr. Rayann Heman, hypothyroidism, anemia, hypertension, and hyperlipidemia.  Of note, she developed a rash while on Plavix and stopped this despite being recommended for lifelong dual antiplatelet therapy.  She was recently admitted after developing chest pain on 02/19/2018.  Pain persisted when she awoke on 02/20/2018 not relieved by nitro, prompting her to report to the ER.  Given her previous disease and symptoms that were concerning for unstable angina, repeat heart catheterization was scheduled.  Of note troponins remain negative and EKG was unchanged.  Heart cath showed stable anatomy with patent RCA stents and patent LIMA to LAD.  No culprit lesions were identified and her chest pain was thought to be secondary to stress and anxiety.  She had no recurrent chest pain on 02/22/2018 and was discharged in stable condition.  She was discharged on aspirin, Crestor 3 times weekly, Norvasc, Coreg, and Imdur.   CAD s/p CABG and PCI   Hyperlipidemia Crestor 5 mg 3 times weekly, Zetia Needs lipid clinic referral for PCSK9 inhibitor   HTN   CHB with PPM in place     Past Medical History:  Diagnosis Date  . Arthritis    "fingers" (06/29/2016)  . Chronic lower back pain   . Colon cancer ([place]) 12/04/2011   s/p Laparoscopic-assisted transverse colectomy on 12/19/2011 by Dr. Donne Hazel.  pT3 N0 M0.   . Coronary artery  disease    a. remote NSTEMI with PCI to the RCA; b. S/P CABG in 2007; c. 2010 - 2 v CAD with L-LAD and S-RCA patent, small caliber Dx 70% (treated medically - no amenable to PCI), CFX free of significant disease, normal LVF. d. 10/2014 Lexi MV: EF 61%, no ischemia/infarct.  . Depression   . Dyslipidemia   . Fibromyalgia   . Gastric ulcer   . GERD (gastroesophageal reflux disease)   . 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 Headache    "weekly" (06/29/2016)  . Hyperlipidemia   . Hypothyroid   . Iron deficiency anemia 11/17/2011  . Memory change 11/13/2016  . Monoclonal gammopathy of unknown significance   . Myocardial infarction ([place]) 2007  . Presence of permanent cardiac pacemaker   . Syncope and collapse    pacemaker implanted  . Unstable angina [facility])     Past Surgical History:  Procedure Laterality Date  . ANTERIOR CERVICAL DECOMP/DISCECTOMY FUSION  1980's  . BACK SURGERY    . CARDIAC CATHETERIZATION  05/20/2009   obstructive native vessel disease in LAD, RCA, and first diagonal, patent vein graft to distal RCA and LIMA to LAD,normal. ef 60%  . CARDIAC CATHETERIZATION N/A 03/24/2015   Procedure: Left Heart Cath and Cors/Grafts Angiography;  Surgeon: Wellington Hampshire, MD;  Location: [place] CV LAB;  Service: Cardiovascular;  Laterality: N/A;  .  CARDIAC CATHETERIZATION N/A 07/28/2015   Procedure: Left Heart Cath and Coronary Angiography;  Surgeon: Troy Sine, MD;  Location: [place] CV LAB;  Service: Cardiovascular;  Laterality: N/A;  . CARDIAC CATHETERIZATION N/A 11/09/2015   Procedure: Coronary Stent Intervention;  Surgeon: Sherren Mocha, MD;  Location: [place] CV LAB;  Service: Cardiovascular;  Laterality: N/A;  . CARDIAC CATHETERIZATION N/A 11/09/2015   Procedure: Left Heart Cath and Coronary Angiography;  Surgeon: Sherren Mocha, MD;  Location: [place] CV LAB;  Service: Cardiovascular;  Laterality:  N/A;  . CARDIAC CATHETERIZATION N/A 06/29/2016   Procedure: Left Heart Cath and Coronary Angiography;  Surgeon: Nelva Bush, MD;  Location: [place] CV LAB;  Service: Cardiovascular;  Laterality: N/A;  . CARDIAC CATHETERIZATION N/A 06/29/2016   Procedure: Intravascular Pressure Wire/FFR Study;  Surgeon: Nelva Bush, MD;  Location: [place] CV LAB;  Service: Cardiovascular;  Laterality: N/A;  . CARDIAC CATHETERIZATION N/A 06/29/2016   Procedure: Coronary Balloon Angioplasty;  Surgeon: Nelva Bush, MD;  Location: [place] CV LAB;  Service: Cardiovascular;  Laterality: N/A;  . COLON RESECTION  12/19/2011   Procedure: COLON RESECTION LAPAROSCOPIC;  Surgeon: Rolm Bookbinder, MD;  Location: [place];  Service: General;  Laterality: N/A;  laparoscopic hand assisted partial colon resection  . COLON SURGERY    . COLONOSCOPY    . CORONARY ANGIOPLASTY WITH STENT PLACEMENT  ~ 2007   1 stent  . CORONARY ARTERY BYPASS GRAFT  2007   "CABG X2"  . DILATION AND CURETTAGE OF UTERUS  1973  . EP IMPLANTABLE DEVICE N/A 03/25/2015   MDT Advisa DR pacemaker implanted by Dr Rayann Heman for transient complete heart block and syncope  . INSERT / REPLACE / REMOVE PACEMAKER    . LEFT HEART CATH AND CORS/GRAFTS ANGIOGRAPHY N/A 02/21/2018   Procedure: LEFT HEART CATH AND CORS/GRAFTS ANGIOGRAPHY;  Surgeon: Lorretta Harp, MD;  Location: [place] CV LAB;  Service: Cardiovascular;  Laterality: N/A;  . PORT-A-CATH REMOVAL N/A 06/12/2013   Procedure: REMOVAL PORT-A-CATH;  Surgeon: Rolm Bookbinder, MD;  Location: [place];  Service: General;  Laterality: N/A;  . PORTACATH PLACEMENT  06/04/2012   Procedure: INSERTION PORT-A-CATH;  Surgeon: Rolm Bookbinder, MD;  Location: [place];  Service: General;  Laterality: N/A;  Insertion of port-a-cath   . POSTERIOR LAMINECTOMY / DECOMPRESSION CERVICAL SPINE  1990s  . VAGINAL HYSTERECTOMY      Current Medications: No outpatient medications have been  marked as taking for the 03/14/18 encounter (Appointment) with Rachel Clark, [place].     Allergies:   Aspirin; Erythromycin; Lisinopril; Niacin and related; Statins; Sulfa drugs cross reactors; Tetracyclines & related; Penicillins; and Plavix [clopidogrel bisulfate]   Social History   Socioeconomic History  . Marital status: Married    Spouse name: Not on file  . Number of children: 2  . Years of education: 1  . Highest education level: Not on file  Occupational History  . 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 Financial resource strain: Not on file  . Food insecurity:    Worry: Not on file    Inability: Not on file  . Transportation needs:    Medical: Not on file    Non-medical: Not on file  Tobacco Use  . Smoking status: Former Smoker    Packs/day: 0.30    Years: 20.00    Pack years: 6.00    Types: Cigarettes  Last attempt to quit: 06/06/1981    Years since quitting: 36.7  . Smokeless tobacco: Never Used  Substance and Sexual Activity  . Alcohol use: Yes    Alcohol/week: 1.0 standard drinks    Types: 1 Glasses of wine per week  . Drug use: No  . Sexual activity: Not Currently    Birth control/protection: Surgical  Lifestyle  . Physical activity:    Days per week: Not on file    Minutes per session: Not on file  . Stress: Not on file  Relationships  . Social connections:    Talks on phone: Not on file    Gets together: Not on file    Attends religious service: Not on file    Active member of club or organization: Not on file    Attends meetings of clubs or organizations: Not on file    Relationship status: Not on file  Other Topics Concern  . Not on file  Social History Narrative   Patient is married with 2 children.   Patient is right handed.   Patient has a high school education with some college education.   Patient drinks 2 cups daily.     Family History: The patient's ***family  history includes Cancer in her mother; Heart attack in her father; Heart attack (age of onset: 35) in her brother; Heart disease in her father. There is no history of Stroke.  ROS:   Please see the history of present illness.    *** All other systems reviewed and are negative.  EKGs/Labs/Other Studies Reviewed:    The following studies were reviewed today:  LEFT HEART CATH AND CORS/GRAFTS ANGIOGRAPHY  Conclusion     Previously placed Ost RCA to Mid RCA stent (unknown type) is widely patent.  Prox LAD lesion is 100% stenosed.  Origin lesion is 100% stenosed.  The left ventricular systolic function is normal.  LV end diastolic pressure is normal.  The left ventricular ejection fraction is 55-65% by visual estimate.   IMPRESSION:Ms Mozer has unchanged anatomy compared to her left calf performed by Dr. Saunders Revel. Her RCA reveals widely patent stents throughout especially at the previously angioplasty site in the proximal segment. The LAD is total with a patent LIMA to the LAD. The vein graft to the right is occluded as it was in the past at the aorta. She has a large ramus branch which is widely patent and a small diminutive circumflex which is unchanged and moderately diseased. I am unsure the etiology of her progressive angina. There are no culprit lesions that would be responsible for this. I performed right common femoral angiography and then [facility] closed her right common femoral puncture site achieving hemostasis. The patient left the lab in stable condition.   EKG:  EKG is *** ordered today.  The ekg ordered today demonstrates ***  Recent Labs: 02/20/2018: ALT 17; Magnesium 2.0; TSH 1.206 02/23/2018: BUN 15; Creatinine, Ser 0.71; Hemoglobin 14.5; Platelets 310; Potassium 4.2; Sodium 134  Recent Lipid Panel    Component Value Date/Time   CHOL 229 (H) 02/21/2018 0254   TRIG 97 02/21/2018 0254   HDL 72 02/21/2018 0254   CHOLHDL 3.2 02/21/2018 0254   VLDL 19 02/21/2018  0254   LDLCALC 138 (H) 02/21/2018 0254    Physical Exam:    VS:  There were no vitals taken for this visit.    Wt Readings from Last 3 Encounters:  02/22/18 191 lb (86.6 kg)  10/29/17 195 lb 6.4 oz (88.6  kg)  10/09/17 194 lb 6.4 oz (88.2 kg)     GEN: *** Well nourished, well developed in no acute distress HEENT: Normal NECK: No JVD; No carotid bruits LYMPHATICS: No lymphadenopathy CARDIAC: ***RRR, no murmurs, rubs, gallops RESPIRATORY:  Clear to auscultation without rales, wheezing or rhonchi  ABDOMEN: Soft, non-tender, non-distended MUSCULOSKELETAL:  No edema; No deformity  SKIN: Warm and dry NEUROLOGIC:  Alert and oriented x 3 PSYCHIATRIC:  Normal affect   ASSESSMENT:    No diagnosis found. PLAN:    In order of problems listed above:  No diagnosis found.   Medication Adjustments/Labs and Tests Ordered: Current medicines are reviewed at length with the patient today.  Concerns regarding medicines are outlined above.  No orders of the defined types were placed in this encounter.  No orders of the defined types were placed in this encounter.   Signed, Rachel Bottcher, PA  03/12/2018 3:25 PM    [facility]

## 2018-03-14 ENCOUNTER — Ambulatory Visit: Payer: Medicare Other | Admitting: Physician Assistant

## 2018-03-14 DIAGNOSIS — R0989 Other specified symptoms and signs involving the circulatory and respiratory systems: Secondary | ICD-10-CM

## 2018-03-15 ENCOUNTER — Encounter: Payer: Self-pay | Admitting: *Deleted

## 2018-03-28 ENCOUNTER — Other Ambulatory Visit: Payer: Self-pay | Admitting: Adult Health

## 2018-03-28 NOTE — Telephone Encounter (Signed)
LVM advising the patient that when Sharyn Lull RN called her on 01/16/18 she was to come back in 3 weeks for carbamazepine level. Advised she did not come in. Requested she come in for the lab or call if she has questions, concerns. Left office number.

## 2018-03-29 ENCOUNTER — Other Ambulatory Visit: Payer: Self-pay | Admitting: Cardiology

## 2018-03-29 DIAGNOSIS — Z23 Encounter for immunization: Secondary | ICD-10-CM | POA: Diagnosis not present

## 2018-03-29 NOTE — Telephone Encounter (Signed)
Attempted to reach patient again. The phone was answered but no one spoke. Will route to NP re: medication refill.

## 2018-03-29 NOTE — Telephone Encounter (Signed)
Rx request sent to pharmacy.  

## 2018-04-01 ENCOUNTER — Encounter (INDEPENDENT_AMBULATORY_CARE_PROVIDER_SITE_OTHER): Payer: Self-pay

## 2018-04-01 ENCOUNTER — Ambulatory Visit (INDEPENDENT_AMBULATORY_CARE_PROVIDER_SITE_OTHER): Payer: Medicare Other | Admitting: Internal Medicine

## 2018-04-01 ENCOUNTER — Encounter: Payer: Self-pay | Admitting: Internal Medicine

## 2018-04-01 VITALS — BP 138/84 | HR 102 | Ht 69.0 in | Wt 191.6 lb

## 2018-04-01 DIAGNOSIS — I10 Essential (primary) hypertension: Secondary | ICD-10-CM | POA: Diagnosis not present

## 2018-04-01 DIAGNOSIS — I25810 Atherosclerosis of coronary artery bypass graft(s) without angina pectoris: Secondary | ICD-10-CM | POA: Diagnosis not present

## 2018-04-01 DIAGNOSIS — I200 Unstable angina: Secondary | ICD-10-CM | POA: Diagnosis not present

## 2018-04-01 DIAGNOSIS — Z950 Presence of cardiac pacemaker: Secondary | ICD-10-CM | POA: Diagnosis not present

## 2018-04-01 DIAGNOSIS — E785 Hyperlipidemia, unspecified: Secondary | ICD-10-CM

## 2018-04-01 DIAGNOSIS — I495 Sick sinus syndrome: Secondary | ICD-10-CM

## 2018-04-01 NOTE — Progress Notes (Signed)
PCP: Harlan Stains, MD Primary Cardiologist: Dr [name] Primary EP:  Dr Rayann Heman  Rachel Clark is a 67 y.o. female who presents today for routine electrophysiology followup.  Since last being seen in our clinic, the patient reports doing reasonably well.  I have not seen her since 2017.  She has not been to Dr [name] recently.  She has had cath in August and subsequent ED visits.  Currently, her CP is controlled with medicines.  She worries about costs of medicines long term.  I have encouraged her to check with her insurance provided about alternatives and also to check [employer] $4 plan. Today, she denies symptoms of palpitations, chest pain, shortness of breath,  lower extremity edema, dizziness, presyncope, or syncope.  The patient is otherwise without complaint today.   Past Medical History:  Diagnosis Date  . Arthritis    "fingers" (06/29/2016)  . Chronic lower back pain   . Colon cancer ([place]) 12/04/2011   s/p Laparoscopic-assisted transverse colectomy on 12/19/2011 by Dr. Donne Hazel.  pT3 N0 M0.   . Coronary artery disease    a. remote NSTEMI with PCI to the RCA; b. S/P CABG in 2007; c. 2010 - 2 v CAD with L-LAD and S-RCA patent, small caliber Dx 70% (treated medically - no amenable to PCI), CFX free of significant disease, normal LVF. d. 10/2014 Lexi MV: EF 61%, no ischemia/infarct.  . Depression   . Dyslipidemia   . Fibromyalgia   . Gastric ulcer   . GERD (gastroesophageal reflux disease)   . 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 Headache    "weekly" (06/29/2016)  . Hyperlipidemia   . Hypothyroid   . Iron deficiency anemia 11/17/2011  . Memory change 11/13/2016  . Monoclonal gammopathy of unknown significance   . Myocardial infarction ([place]) 2007  . Presence of permanent cardiac pacemaker   . Syncope and collapse    pacemaker implanted  . Unstable angina [facility])    Past Surgical History:  Procedure  Laterality Date  . ANTERIOR CERVICAL DECOMP/DISCECTOMY FUSION  1980's  . BACK SURGERY    . CARDIAC CATHETERIZATION  05/20/2009   obstructive native vessel disease in LAD, RCA, and first diagonal, patent vein graft to distal RCA and LIMA to LAD,normal. ef 60%  . CARDIAC CATHETERIZATION N/A 03/24/2015   Procedure: Left Heart Cath and Cors/Grafts Angiography;  Surgeon: Wellington Hampshire, MD;  Location: [place] CV LAB;  Service: Cardiovascular;  Laterality: N/A;  . CARDIAC CATHETERIZATION N/A 07/28/2015   Procedure: Left Heart Cath and Coronary Angiography;  Surgeon: Troy Sine, MD;  Location: [place] CV LAB;  Service: Cardiovascular;  Laterality: N/A;  . CARDIAC CATHETERIZATION N/A 11/09/2015   Procedure: Coronary Stent Intervention;  Surgeon: Sherren Mocha, MD;  Location: [place] CV LAB;  Service: Cardiovascular;  Laterality: N/A;  . CARDIAC CATHETERIZATION N/A 11/09/2015   Procedure: Left Heart Cath and Coronary Angiography;  Surgeon: Sherren Mocha, MD;  Location: [place] CV LAB;  Service: Cardiovascular;  Laterality: N/A;  . CARDIAC CATHETERIZATION N/A 06/29/2016   Procedure: Left Heart Cath and Coronary Angiography;  Surgeon: Nelva Bush, MD;  Location: [place] CV LAB;  Service: Cardiovascular;  Laterality: N/A;  . CARDIAC CATHETERIZATION N/A 06/29/2016   Procedure: Intravascular Pressure Wire/FFR Study;  Surgeon: Nelva Bush, MD;  Location: [place] CV LAB;  Service: Cardiovascular;  Laterality: N/A;  . CARDIAC CATHETERIZATION N/A 06/29/2016   Procedure:  Coronary Balloon Angioplasty;  Surgeon: Nelva Bush, MD;  Location: [place] CV LAB;  Service: Cardiovascular;  Laterality: N/A;  . COLON RESECTION  12/19/2011   Procedure: COLON RESECTION LAPAROSCOPIC;  Surgeon: Rolm Bookbinder, MD;  Location: [place];  Service: General;  Laterality: N/A;  laparoscopic hand assisted partial colon resection  . COLON SURGERY    . COLONOSCOPY    . CORONARY ANGIOPLASTY WITH  STENT PLACEMENT  ~ 2007   1 stent  . CORONARY ARTERY BYPASS GRAFT  2007   "CABG X2"  . DILATION AND CURETTAGE OF UTERUS  1973  . EP IMPLANTABLE DEVICE N/A 03/25/2015   MDT Advisa DR pacemaker implanted by Dr Rayann Heman for transient complete heart block and syncope  . INSERT / REPLACE / REMOVE PACEMAKER    . LEFT HEART CATH AND CORS/GRAFTS ANGIOGRAPHY N/A 02/21/2018   Procedure: LEFT HEART CATH AND CORS/GRAFTS ANGIOGRAPHY;  Surgeon: Lorretta Harp, MD;  Location: [place] CV LAB;  Service: Cardiovascular;  Laterality: N/A;  . PORT-A-CATH REMOVAL N/A 06/12/2013   Procedure: REMOVAL PORT-A-CATH;  Surgeon: Rolm Bookbinder, MD;  Location: [place];  Service: General;  Laterality: N/A;  . PORTACATH PLACEMENT  06/04/2012   Procedure: INSERTION PORT-A-CATH;  Surgeon: Rolm Bookbinder, MD;  Location: [place];  Service: General;  Laterality: N/A;  Insertion of port-a-cath   . POSTERIOR LAMINECTOMY / DECOMPRESSION CERVICAL SPINE  1990s  . VAGINAL HYSTERECTOMY      ROS- all systems are reviewed and negative except as per HPI above  Current Outpatient Medications  Medication Sig Dispense Refill  . acetaminophen (TYLENOL) 500 MG tablet Take 500-1,000 mg by mouth daily as needed for mild pain or headache.    Marland Kitchen amLODipine (NORVASC) 10 MG tablet Take 10 mg by mouth daily.     Marland Kitchen aspirin 81 MG chewable tablet Chew 1 tablet (81 mg total) by mouth daily.    . Azelastine HCl 0.15 % SOLN Place 1 spray into both nostrils daily as needed (seasonal allergies).   12  . carbamazepine (TEGRETOL XR) 200 MG 12 hr tablet TAKE 3 TABLETS TWICE A DAY 180 tablet 0  . carvedilol (COREG) 12.5 MG tablet TAKE 1 TABLET (12.5 MG TOTAL) BY MOUTH 2 (TWO) TIMES DAILY WITH A MEAL. 60 tablet 5  . DULoxetine (CYMBALTA) 60 MG capsule Take 60 mg by mouth daily.     Marland Kitchen estradiol (VIVELLE-DOT) 0.05 MG/24HR patch Place 1 patch onto the skin 2 (two) times a week. Sunday, Wednesday    . ethosuximide (ZARONTIN) 250 MG  capsule Take 2 capsule (500mg ) in the morning, , and 2 capsules (500mg ) at night (Patient taking differently: Take 500 mg by mouth 2 (two) times daily. ) 360 capsule 3  . ezetimibe (ZETIA) 10 MG tablet Take 1 tablet (10 mg total) by mouth daily. 90 tablet 3  . ibuprofen (ADVIL,MOTRIN) 600 MG tablet Take 1 tablet (600 mg total) by mouth every 8 (eight) hours as needed. (Patient taking differently: Take 600 mg by mouth every 8 (eight) hours as needed for headache or mild pain. ) 15 tablet 0  . isosorbide mononitrate (IMDUR) 30 MG 24 hr tablet Take 1 tablet (30 mg total) by mouth daily. Please schedule appt with Dr. [name] for refills. 90 tablet 0  . levothyroxine (SYNTHROID, LEVOTHROID) 50 MCG tablet Take 50 mcg by mouth daily.     . memantine (NAMENDA) 10 MG tablet TAKE 1 TABLET BY MOUTH TWICE A DAY (Patient taking differently: Take 10 mg by  mouth 2 (two) times daily. ) 180 tablet 1  . nitroGLYCERIN (NITROSTAT) 0.4 MG SL tablet Place 1 tablet (0.4 mg total) under the tongue every 5 (five) minutes as needed. For chest pain. 25 tablet 6  . rosuvastatin (CRESTOR) 5 MG tablet Take 1 tablet by mouth up to 3 times weekly as directed 12 tablet 3   No current facility-administered medications for this visit.     Physical Exam: Vitals:   04/01/18 1541  BP: 138/84  Pulse: (!) 102  SpO2: 97%  Weight: 191 lb 9.6 oz (86.9 kg)  Height: 5\' 9"  (1.753 m)    GEN- The patient is well appearing, alert and oriented x 3 today.   Head- normocephalic, atraumatic Eyes-  Sclera clear, conjunctiva pink Ears- hearing intact Oropharynx- clear Lungs- Clear to ausculation bilaterally, normal work of breathing Chest- pacemaker pocket is well healed Heart- Regular rate and rhythm, no murmurs, rubs or gallops, PMI not laterally displaced GI- soft, NT, ND, + BS Extremities- no clubbing, cyanosis, or edema  Pacemaker interrogation- reviewed in detail today,  See PACEART report    Assessment and Plan:  1.  Symptomatic sinus bradycardia and transient complete heart block Normal pacemaker function See Pace Art report No changes today V paced < 1%, atrial paces 1.6%  2. HTN Stable No change required today  3. CAD S/p cath 02/21/18 (reviewed) which revealed: Her RCA reveals widely patent stents throughout especially at the previously angioplasty site in the proximal segment.  The LAD is total with a patent LIMA to the LAD.  The vein graft to the right is occluded as it was in the past at the aorta.  She has a large ramus branch which is widely patent and a small diminutive circumflex which is unchanged and moderately diseased.  No ischemic symptoms No changes  Needs follow-up with Dr Doug Sou team.  She is very concerned about costs of medicines and also costs of health care.  4. HL Followed in [facility] lipid clinic  [facility] Return to see EP NP annually, should not need to see electrophysiologist routinely Follow-up with Dr [name] next available   Thompson Grayer MD, [facility] 04/01/2018 4:10 PM

## 2018-04-01 NOTE — Patient Instructions (Addendum)
Medication Instructions:  Your physician recommends that you continue on your current medications as directed. Please refer to the Current Medication list given to you today.  Labwork: None ordered.  Testing/Procedures: None ordered.  Follow-Up: Your physician wants you to follow-up with Dr. [name] or a member of his team FIRST available.  Your physician wants you to follow-up in: one year with Tommye Standard, PA.   You will receive a reminder letter in the mail two months in advance. If you don't receive a letter, please call our office to schedule the follow-up appointment.  Remote monitoring is used to monitor your Pacemaker from home. This monitoring reduces the number of office visits required to check your device to one time per year. It allows [place] to keep an eye on the functioning of your device to ensure it is working properly. You are scheduled for a device check from home on 07/01/2018. You may send your transmission at any time that day. If you have a wireless device, the transmission will be sent automatically. After your physician reviews your transmission, you will receive a postcard with your next transmission date.  Any Other Special Instructions Will Be Listed Below (If Applicable).  If you need a refill on your cardiac medications before your next appointment, please call your pharmacy.

## 2018-04-04 ENCOUNTER — Ambulatory Visit (INDEPENDENT_AMBULATORY_CARE_PROVIDER_SITE_OTHER): Payer: Medicare Other | Admitting: Pharmacist Clinician (PhC)/ Clinical Pharmacy Specialist

## 2018-04-04 DIAGNOSIS — E785 Hyperlipidemia, unspecified: Secondary | ICD-10-CM

## 2018-04-04 DIAGNOSIS — I200 Unstable angina: Secondary | ICD-10-CM

## 2018-04-04 NOTE — Patient Instructions (Signed)
We will start paperwork to get your insurance to cover Repatha.    If you have any questions along the way, please give [place] a call [phone] (Karly Pitter/Raquel)  Evolocumab injection What is this medicine? EVOLOCUMAB (e voe LOK ue mab) is known as a PCSK9 inhibitor. It is used to lower the level of cholesterol in the blood. It may be used alone or in combination with other cholesterol-lowering drugs. This drug may also be used to reduce the risk of heart attack, stroke, and certain types of heart surgery in patients with heart disease. This medicine may be used for other purposes; ask your health care provider or pharmacist if you have questions. COMMON BRAND NAME(S): REPATHA What should I tell my health care provider before I take this medicine? They need to know if you have any of these conditions: -an unusual or allergic reaction to evolocumab, other medicines, foods, dyes, or preservatives -pregnant or trying to get pregnant -breast-feeding How should I use this medicine? This medicine is for injection under the skin. You will be taught how to prepare and give this medicine. Use exactly as directed. Take your medicine at regular intervals. Do not take your medicine more often than directed. It is important that you put your used needles and syringes in a special sharps container. Do not put them in a trash can. If you do not have a sharps container, call your pharmacist or health care provider to get one. Talk to your pediatrician regarding the use of this medicine in children. While this drug may be prescribed for children as young as 13 years for selected conditions, precautions do apply. Overdosage: If you think you have taken too much of this medicine contact a poison control center or emergency room at once. NOTE: This medicine is only for you. Do not share this medicine with others. What if I miss a dose? If you miss a dose, take it as soon as you can if there are more than 7 days until  the next scheduled dose, or skip the missed dose and take the next dose according to your original schedule. Do not take double or extra doses. What may interact with this medicine? Interactions are not expected. This list may not describe all possible interactions. Give your health care provider a list of all the medicines, herbs, non-prescription drugs, or dietary supplements you use. Also tell them if you smoke, drink alcohol, or use illegal drugs. Some items may interact with your medicine. What should I watch for while using this medicine? You may need blood work while you are taking this medicine. What side effects may I notice from receiving this medicine? Side effects that you should report to your doctor or health care professional as soon as possible: -allergic reactions like skin rash, itching or hives, swelling of the face, lips, or tongue -signs and symptoms of infection like fever or chills; cough; sore throat; pain or trouble passing urine Side effects that usually do not require medical attention (report to your doctor or health care professional if they continue or are bothersome): -diarrhea -nausea -muscle pain -pain, redness, or irritation at site where injected This list may not describe all possible side effects. Call your doctor for medical advice about side effects. You may report side effects to FDA at 1-800-FDA-1088. Where should I keep my medicine? Keep out of the reach of children. You will be instructed on how to store this medicine. Throw away any unused medicine after the expiration date on  the label. NOTE: This sheet is a summary. It may not cover all possible information. If you have questions about this medicine, talk to your doctor, pharmacist, or health care provider.  2018 Elsevier/Gold Standard (2016-06-05 13:21:53)

## 2018-04-04 NOTE — Progress Notes (Signed)
04/04/2018 Rachel Clark [birth year] [id number]   HPI:  Rachel Clark is a 67 y.o. female patient of Dr , who presents today for a lipid clinic evaluation.  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  We had met with her back in 2016-2017, at which time PCSK-9 inhibitors were much more expensive and harder to get approved.  Now that the price has dropped, she is interested in the possibility of getting this medication.    Current Medications:  Ezetimibe 10 mg  Rosuvastatin 5 mg tiw - some muscle aches, but still taking  Cholesterol Goals:   LDL < 70  Intolerant/previously tried:  Niacin - flushing  pitavastatin 4 mg, pravastatin 10 mg, rosuvastatin daily - all caused myalgias   Family history:   Cardiac history only pertinent for father dying at 71 from MI  Diet:   Tries to eat a healthy diet, avoids red meat; diet high in beans, fruits and vegetables  Labs:   01/2018:  TC 229, TG 97, HDL 72, LDL 138  Current Outpatient Medications  Medication Sig Dispense Refill  . acetaminophen (TYLENOL) 500 MG tablet Take 500-1,000 mg by mouth daily as needed for mild pain or headache.    Marland Kitchen amLODipine (NORVASC) 10 MG tablet Take 10 mg by mouth daily.     Marland Kitchen aspirin 81 MG chewable tablet Chew 1 tablet (81 mg total) by mouth daily.    . Azelastine HCl 0.15 % SOLN Place 1 spray into both nostrils daily as needed (seasonal allergies).   12  . carbamazepine (TEGRETOL XR) 200 MG 12 hr tablet TAKE 3 TABLETS TWICE A DAY 180 tablet 0  . carvedilol (COREG) 12.5 MG tablet TAKE 1 TABLET (12.5 MG TOTAL) BY MOUTH 2 (TWO) TIMES DAILY WITH A MEAL. 60 tablet 5  . DULoxetine (CYMBALTA) 60 MG capsule Take 60 mg by mouth daily.     Marland Kitchen estradiol (VIVELLE-DOT) 0.05 MG/24HR patch Place 1 patch onto the skin 2 (two) times a week. Sunday, Wednesday    . ethosuximide (ZARONTIN) 250 MG capsule Take 2 capsule (538m) in the  morning, , and 2 capsules (5075m at night (Patient taking differently: Take 500 mg by mouth 2 (two) times daily. ) 360 capsule 3  . ezetimibe (ZETIA) 10 MG tablet Take 1 tablet (10 mg total) by mouth daily. 90 tablet 3  . ibuprofen (ADVIL,MOTRIN) 600 MG tablet Take 1 tablet (600 mg total) by mouth every 8 (eight) hours as needed. (Patient taking differently: Take 600 mg by mouth every 8 (eight) hours as needed for headache or mild pain. ) 15 tablet 0  . isosorbide mononitrate (IMDUR) 30 MG 24 hr tablet Take 1 tablet (30 mg total) by mouth daily. Please schedule appt with Dr. JoMartiniqueor refills. 90 tablet 0  . levothyroxine (SYNTHROID, LEVOTHROID) 50 MCG tablet Take 50 mcg by mouth daily.     . memantine (NAMENDA) 10 MG tablet TAKE 1 TABLET BY MOUTH TWICE A DAY (Patient taking differently: Take 10 mg by mouth 2 (two) times daily. ) 180 tablet 1  . nitroGLYCERIN (NITROSTAT) 0.4 MG SL tablet Place 1 tablet (0.4 mg total) under the tongue every 5 (five) minutes as needed. For chest pain. 25 tablet 6  . rosuvastatin (CRESTOR) 5 MG tablet Take 1 tablet by mouth up to 3 times weekly as directed 12 tablet 3   No current facility-administered medications for this  visit.     Allergies  Allergen Reactions  . Aspirin Other (See Comments)    GI upset- can tolerate 81 mg ASA, just not full doses  . Erythromycin Nausea Only    Gi upset  . Lisinopril Cough  . Niacin And Related Other (See Comments)    Pt does not recall reactions   . Statins Other (See Comments)    Muscle pain  . Sulfa Drugs Cross Reactors Swelling  . Tetracyclines & Related Nausea Only  . Penicillins Nausea Only and Rash    Has patient had a PCN reaction causing immediate rash, facial/tongue/throat swelling, SOB or lightheadedness with hypotension: Yes Has patient had a PCN reaction causing severe rash involving mucus membranes or skin necrosis: No Has patient had a PCN reaction that required hospitalization No Has patient had a PCN  reaction occurring within the last 10 years: No If all of the above answers are "NO", then may proceed with Cephalosporin use.   Marland Kitchen Plavix [Clopidogrel Bisulfate] Rash    Past Medical History:  Diagnosis Date  . Arthritis    "fingers" (06/29/2016)  . Chronic lower back pain   . Colon cancer ([place]) 12/04/2011   s/p Laparoscopic-assisted transverse colectomy on 12/19/2011 by Dr. Donne Hazel.  pT3 N0 M0.   . Coronary artery disease    a. remote NSTEMI with PCI to the RCA; b. S/P CABG in 2007; c. 2010 - 2 v CAD with L-LAD and S-RCA patent, small caliber Dx 70% (treated medically - no amenable to PCI), CFX free of significant disease, normal LVF. d. 10/2014 Lexi MV: EF 61%, no ischemia/infarct.  . Depression   . Dyslipidemia   . Fibromyalgia   . Gastric ulcer   . GERD (gastroesophageal reflux disease)   . 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 Headache    "weekly" (06/29/2016)  . Hyperlipidemia   . Hypothyroid   . Iron deficiency anemia 11/17/2011  . Memory change 11/13/2016  . Monoclonal gammopathy of unknown significance   . Myocardial infarction ([place]) 2007  . Presence of permanent cardiac pacemaker   . Syncope and collapse    pacemaker implanted  . Unstable angina ([facility])     There were no vitals taken for this visit.   Dyslipidemia We had a long discussion today about treatment options available to her, including PCSK-9i, and research.  She would like to be off the statin, as it does cause her some discomfort.  They understand the pricing of Repatha, including a potential $400 deductible that may have to be met. If her plan has a deductible she would like to wait until Jan 1 to start the medication.    Tommy Medal PharmD CPP [facility]

## 2018-04-04 NOTE — Assessment & Plan Note (Signed)
We had a long discussion today about treatment options available to her, including PCSK-9i, and research.  She would like to be off the statin, as it does cause her some discomfort.  They understand the pricing of Repatha, including a potential $400 deductible that may have to be met. If her plan has a deductible she would like to wait until Jan 1 to start the medication.

## 2018-04-09 NOTE — Progress Notes (Signed)
Cardiology Office Note:    Date:  04/11/2018   ID:  CLIFFIE GINGRAS, DOB [birth year], MRN [id number]  PCP:  Harlan Stains, MD  Cardiologist:  Peter [name], MD   Referring MD: Harlan Stains, MD   Chief Complaint  Patient presents with  . Follow-up    CAD, no chest pain    History of Present Illness:    Rachel Clark is a 67 y.o. female with a hx of CAD status post CABG with subsequent failure of the SVG to RCA, status post extensive stenting of the RCA in the past, last intervention in 2017 with PTCA of the ostial RCA for restenosis.  She also has a history of complete heart block status post pacemaker followed by Dr. Rayann Heman, hypothyroidism, anemia, hypertension, and hyperlipidemia.  Of note, she developed a rash while on Plavix and stopped this despite being recommended for lifelong dual antiplatelet therapy.  She was recently admitted after developing chest pain on 02/19/2018.  Pain persisted when she awoke on 02/20/2018 not relieved by nitro, prompting her to report to the ER.  Given her previous disease and symptoms that were concerning for unstable angina, repeat heart catheterization was scheduled.  Of note troponins remained negative and EKG was unchanged.  Heart cath showed stable anatomy with patent RCA stents and patent LIMA to LAD.  No culprit lesions were identified and her chest pain was thought to be secondary to stress and anxiety.  She had no recurrent chest pain on 02/22/2018 and was discharged in stable condition.  She was discharged on aspirin, Crestor 3 times weekly, Norvasc, Coreg, and Imdur.  She was discharged on 02/22/18 but reported back to the ER on 02/23/18 with chest pain, ruled out with negative troponin. She was seen by Dr. Rayann Heman on 04/01/18 and was doing well at that time. She has also been seen in lipid clinic and is considering starting repatha in January.  She returns today for follow up.  She presents with her husband.  After discussion with the EDP, she  started taking Nexium and this has resolved her chest pain.  She is not taking carvedilol, although it is listed on her medication list. Given her past history of symptomatic bradycardia, will not pursue beta blocker. She has no complaints, including no shortness of breath, dyspnea on exertion, lower extremity swelling, orthopnea, and syncope. She is very concerned about the cost of medications. I reviewed her med list including prices with them.   Past Medical History:  Diagnosis Date  . Arthritis    "fingers" (06/29/2016)  . Chronic lower back pain   . Colon cancer ([place]) 12/04/2011   s/p Laparoscopic-assisted transverse colectomy on 12/19/2011 by Dr. Donne Hazel.  pT3 N0 M0.   . Coronary artery disease    a. remote NSTEMI with PCI to the RCA; b. S/P CABG in 2007; c. 2010 - 2 v CAD with L-LAD and S-RCA patent, small caliber Dx 70% (treated medically - no amenable to PCI), CFX free of significant disease, normal LVF. d. 10/2014 Lexi MV: EF 61%, no ischemia/infarct.  . Depression   . Dyslipidemia   . Fibromyalgia   . Gastric ulcer   . GERD (gastroesophageal reflux disease)   . 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 Headache    "weekly" (06/29/2016)  . Hyperlipidemia   . Hypothyroid   . Iron deficiency anemia 11/17/2011  . Memory change 11/13/2016  . Monoclonal gammopathy of  unknown significance   . Myocardial infarction ([place]) 2007  . Presence of permanent cardiac pacemaker   . Syncope and collapse    pacemaker implanted  . Unstable angina [facility])     Past Surgical History:  Procedure Laterality Date  . ANTERIOR CERVICAL DECOMP/DISCECTOMY FUSION  1980's  . BACK SURGERY    . CARDIAC CATHETERIZATION  05/20/2009   obstructive native vessel disease in LAD, RCA, and first diagonal, patent vein graft to distal RCA and LIMA to LAD,normal. ef 60%  . CARDIAC CATHETERIZATION N/A 03/24/2015   Procedure: Left Heart Cath and  Cors/Grafts Angiography;  Surgeon: Wellington Hampshire, MD;  Location: [place] [facility];  Service: Cardiovascular;  Laterality: N/A;  . CARDIAC CATHETERIZATION N/A 07/28/2015   Procedure: Left Heart Cath and Coronary Angiography;  Surgeon: Troy Sine, MD;  Location: [place] [facility];  Service: Cardiovascular;  Laterality: N/A;  . CARDIAC CATHETERIZATION N/A 11/09/2015   Procedure: Coronary Stent Intervention;  Surgeon: Sherren Mocha, MD;  Location: [place] [facility];  Service: Cardiovascular;  Laterality: N/A;  . CARDIAC CATHETERIZATION N/A 11/09/2015   Procedure: Left Heart Cath and Coronary Angiography;  Surgeon: Sherren Mocha, MD;  Location: [place] [facility];  Service: Cardiovascular;  Laterality: N/A;  . CARDIAC CATHETERIZATION N/A 06/29/2016   Procedure: Left Heart Cath and Coronary Angiography;  Surgeon: Nelva Bush, MD;  Location: [place] [facility];  Service: Cardiovascular;  Laterality: N/A;  . CARDIAC CATHETERIZATION N/A 06/29/2016   Procedure: Intravascular Pressure Wire/FFR Study;  Surgeon: Nelva Bush, MD;  Location: [place] [facility];  Service: Cardiovascular;  Laterality: N/A;  . CARDIAC CATHETERIZATION N/A 06/29/2016   Procedure: Coronary Balloon Angioplasty;  Surgeon: Nelva Bush, MD;  Location: [place] [facility];  Service: Cardiovascular;  Laterality: N/A;  . COLON RESECTION  12/19/2011   Procedure: COLON RESECTION LAPAROSCOPIC;  Surgeon: Rolm Bookbinder, MD;  Location: [place];  Service: General;  Laterality: N/A;  laparoscopic hand assisted partial colon resection  . COLON SURGERY    . COLONOSCOPY    . CORONARY ANGIOPLASTY WITH STENT PLACEMENT  ~ 2007   1 stent  . CORONARY ARTERY BYPASS GRAFT  2007   "CABG X2"  . DILATION AND CURETTAGE OF UTERUS  1973  . EP IMPLANTABLE DEVICE N/A 03/25/2015   MDT Advisa DR pacemaker implanted by Dr Rayann Heman for transient complete heart block and syncope  . INSERT / REPLACE / REMOVE PACEMAKER    . LEFT HEART CATH AND  CORS/GRAFTS ANGIOGRAPHY N/A 02/21/2018   Procedure: LEFT HEART CATH AND CORS/GRAFTS ANGIOGRAPHY;  Surgeon: Lorretta Harp, MD;  Location: [place] [facility];  Service: Cardiovascular;  Laterality: N/A;  . PORT-A-CATH REMOVAL N/A 06/12/2013   Procedure: REMOVAL PORT-A-CATH;  Surgeon: Rolm Bookbinder, MD;  Location: [place];  Service: General;  Laterality: N/A;  . PORTACATH PLACEMENT  06/04/2012   Procedure: INSERTION PORT-A-CATH;  Surgeon: Rolm Bookbinder, MD;  Location: [place];  Service: General;  Laterality: N/A;  Insertion of port-a-cath   . POSTERIOR LAMINECTOMY / DECOMPRESSION CERVICAL SPINE  1990s  . VAGINAL HYSTERECTOMY      Current Medications: Current Meds  Medication Sig  . acetaminophen (TYLENOL) 500 MG tablet Take 500-1,000 mg by mouth daily as needed for mild pain or headache.  Marland Kitchen amLODipine (NORVASC) 10 MG tablet Take 1 tablet (10 mg total) by mouth daily.  Marland Kitchen aspirin 81 MG chewable tablet Chew 1 tablet (81 mg total) by mouth daily.  . Azelastine HCl 0.15 %  SOLN Place 1 spray into both nostrils daily as needed (seasonal allergies).   . carbamazepine (TEGRETOL XR) 200 MG 12 hr tablet TAKE 3 TABLETS TWICE A DAY  . carvedilol (COREG) 6.25 MG tablet Take 1 tablet (6.25 mg total) by mouth 2 (two) times daily with a meal.  . DULoxetine (CYMBALTA) 60 MG capsule Take 60 mg by mouth daily.   Marland Kitchen estradiol (VIVELLE-DOT) 0.05 MG/24HR patch Place 1 patch onto the skin 2 (two) times a week. Sunday, Wednesday  . ethosuximide (ZARONTIN) 250 MG capsule Take 2 capsule (500mg ) in the morning, , and 2 capsules (500mg ) at night (Patient taking differently: Take 500 mg by mouth 2 (two) times daily. )  . ezetimibe (ZETIA) 10 MG tablet Take 1 tablet (10 mg total) by mouth daily.  Marland Kitchen ibuprofen (ADVIL,MOTRIN) 600 MG tablet Take 1 tablet (600 mg total) by mouth every 8 (eight) hours as needed. (Patient taking differently: Take 600 mg by mouth every 8 (eight) hours as needed for headache or  mild pain. )  . isosorbide mononitrate (IMDUR) 30 MG 24 hr tablet Take 1 tablet (30 mg total) by mouth daily.  Marland Kitchen levothyroxine (SYNTHROID, LEVOTHROID) 50 MCG tablet Take 50 mcg by mouth daily.   . memantine (NAMENDA) 10 MG tablet TAKE 1 TABLET BY MOUTH TWICE A DAY (Patient taking differently: Take 10 mg by mouth 2 (two) times daily. )  . nitroGLYCERIN (NITROSTAT) 0.4 MG SL tablet Place 1 tablet (0.4 mg total) under the tongue every 5 (five) minutes as needed. For chest pain.  . rosuvastatin (CRESTOR) 5 MG tablet Take 1 tablet by mouth up to 3 times weekly as directed  . [DISCONTINUED] amLODipine (NORVASC) 10 MG tablet Take 10 mg by mouth daily.   . [DISCONTINUED] carvedilol (COREG) 12.5 MG tablet TAKE 1 TABLET (12.5 MG TOTAL) BY MOUTH 2 (TWO) TIMES DAILY WITH A MEAL.  . [DISCONTINUED] ezetimibe (ZETIA) 10 MG tablet Take 1 tablet (10 mg total) by mouth daily.  . [DISCONTINUED] isosorbide mononitrate (IMDUR) 30 MG 24 hr tablet Take 1 tablet (30 mg total) by mouth daily. Please schedule appt with Dr. [name] for refills.  . [DISCONTINUED] rosuvastatin (CRESTOR) 5 MG tablet Take 1 tablet by mouth up to 3 times weekly as directed     Allergies:   Aspirin; Erythromycin; Lisinopril; Niacin and related; Statins; Sulfa drugs cross reactors; Tetracyclines & related; Penicillins; and Plavix [clopidogrel bisulfate]   Social History   Socioeconomic History  . Marital status: Married    Spouse name: Not on file  . Number of children: 2  . Years of education: 76  . Highest education level: Not on file  Occupational History  . 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 Financial resource strain: Not on file  . Food insecurity:    Worry: Not on file    Inability: Not on file  . Transportation needs:    Medical: Not on file    Non-medical: Not on file  Tobacco Use  . Smoking status: Former Smoker    Packs/day: 0.30    Years: 20.00    Pack  years: 6.00    Types: Cigarettes    Last attempt to quit: 06/06/1981    Years since quitting: 36.8  . Smokeless tobacco: Never Used  Substance and Sexual Activity  . Alcohol use: Yes    Alcohol/week: 1.0 standard drinks    Types: 1 Glasses of wine per  week  . Drug use: No  . Sexual activity: Not Currently    Birth control/protection: Surgical  Lifestyle  . Physical activity:    Days per week: Not on file    Minutes per session: Not on file  . Stress: Not on file  Relationships  . Social connections:    Talks on phone: Not on file    Gets together: Not on file    Attends religious service: Not on file    Active member of club or organization: Not on file    Attends meetings of clubs or organizations: Not on file    Relationship status: Not on file  Other Topics Concern  . Not on file  Social History Narrative   Patient is married with 2 children.   Patient is right handed.   Patient has a high school education with some college education.   Patient drinks 2 cups daily.     Family History: The patient's family history includes Cancer in her mother; Heart attack in her father; Heart attack (age of onset: 70) in her brother; Heart disease in her father. There is no history of Stroke.  ROS:   Please see the history of present illness.     All other systems reviewed and are negative.  EKGs/Labs/Other Studies Reviewed:    The following studies were reviewed today:  Left heart cath 02/21/2018  Previously placed Ost RCA to Mid RCA stent (unknown type) is widely patent.  Prox LAD lesion is 100% stenosed.  Origin lesion is 100% stenosed.  The left ventricular systolic function is normal.  LV end diastolic pressure is normal.  The left ventricular ejection fraction is 55-65% by visual estimate.  EKG:  EKG is ordered today.  The ekg ordered today demonstrates sinus  Recent Labs: 02/20/2018: ALT 17; Magnesium 2.0; TSH 1.206 02/23/2018: BUN 15; Creatinine, Ser 0.71;  Hemoglobin 14.5; Platelets 310; Potassium 4.2; Sodium 134  Recent Lipid Panel    Component Value Date/Time   CHOL 229 (H) 02/21/2018 0254   TRIG 97 02/21/2018 0254   HDL 72 02/21/2018 0254   CHOLHDL 3.2 02/21/2018 0254   VLDL 19 02/21/2018 0254   LDLCALC 138 (H) 02/21/2018 0254    Physical Exam:    VS:  BP 120/72   Pulse 85   Ht 5\' 9"  (1.753 m)   Wt 195 lb (88.5 kg)   BMI 28.80 kg/m     Wt Readings from Last 3 Encounters:  04/11/18 195 lb (88.5 kg)  04/01/18 191 lb 9.6 oz (86.9 kg)  02/22/18 191 lb (86.6 kg)     GEN:  Well nourished, well developed in no acute distress HEENT: Normal NECK: No JVD; No carotid bruits CARDIAC: RRR, no murmurs, rubs, gallops RESPIRATORY:  Clear to auscultation without rales, wheezing or rhonchi  ABDOMEN: Soft, non-tender, non-distended MUSCULOSKELETAL:  No edema; No deformity  SKIN: Warm and dry NEUROLOGIC:  Alert and oriented x 3 PSYCHIATRIC:  Normal affect   ASSESSMENT:    1. Essential hypertension   2. Coronary artery disease involving coronary bypass graft of native heart without angina pectoris   3. Dyslipidemia    PLAN:    In order of problems listed above:  Essential hypertension - Plan: EKG 12-Lead Pressure is well-controlled.    Coronary artery disease involving coronary bypass graft of native heart without angina pectoris Chest pain is now resolved with nexium. No anginal complaints. She is on ASA, no plavix due to rash.   Dyslipidemia Continue zetia.  She has been intolerant to statins. She is considering repatha.   Follow up in 6 months.   Medication Adjustments/Labs and Tests Ordered: Current medicines are reviewed at length with the patient today.  Concerns regarding medicines are outlined above.  Orders Placed This Encounter  Procedures  . EKG 12-Lead   Meds ordered this encounter  Medications  . carvedilol (COREG) 6.25 MG tablet    Sig: Take 1 tablet (6.25 mg total) by mouth 2 (two) times daily with a  meal.    Dispense:  90 tablet    Refill:  1  . amLODipine (NORVASC) 10 MG tablet    Sig: Take 1 tablet (10 mg total) by mouth daily.    Dispense:  90 tablet    Refill:  1  . ezetimibe (ZETIA) 10 MG tablet    Sig: Take 1 tablet (10 mg total) by mouth daily.    Dispense:  90 tablet    Refill:  1  . isosorbide mononitrate (IMDUR) 30 MG 24 hr tablet    Sig: Take 1 tablet (30 mg total) by mouth daily.    Dispense:  90 tablet    Refill:  1  . rosuvastatin (CRESTOR) 5 MG tablet    Sig: Take 1 tablet by mouth up to 3 times weekly as directed    Dispense:  12 tablet    Refill:  3    Signed, [name], [place]  04/11/2018 4:47 PM    [facility]

## 2018-04-11 ENCOUNTER — Encounter: Payer: Self-pay | Admitting: Physician Assistant

## 2018-04-11 ENCOUNTER — Telehealth: Payer: Self-pay

## 2018-04-11 ENCOUNTER — Ambulatory Visit (INDEPENDENT_AMBULATORY_CARE_PROVIDER_SITE_OTHER): Payer: Medicare Other | Admitting: Physician Assistant

## 2018-04-11 ENCOUNTER — Ambulatory Visit: Payer: Medicare Other

## 2018-04-11 VITALS — BP 120/72 | HR 85 | Ht 69.0 in | Wt 195.0 lb

## 2018-04-11 DIAGNOSIS — I25810 Atherosclerosis of coronary artery bypass graft(s) without angina pectoris: Secondary | ICD-10-CM | POA: Diagnosis not present

## 2018-04-11 DIAGNOSIS — I10 Essential (primary) hypertension: Secondary | ICD-10-CM

## 2018-04-11 DIAGNOSIS — E785 Hyperlipidemia, unspecified: Secondary | ICD-10-CM

## 2018-04-11 MED ORDER — ROSUVASTATIN CALCIUM 5 MG PO TABS: ORAL_TABLET | ORAL | 3 refills | Status: DC

## 2018-04-11 MED ORDER — CARVEDILOL 6.25 MG PO TABS: 6.2500 mg | ORAL_TABLET | Freq: Two times a day (BID) | ORAL | 1 refills | Status: DC

## 2018-04-11 MED ORDER — EZETIMIBE 10 MG PO TABS: 10.0000 mg | ORAL_TABLET | Freq: Every day | ORAL | 1 refills | Status: DC

## 2018-04-11 MED ORDER — AMLODIPINE BESYLATE 10 MG PO TABS: 10.0000 mg | ORAL_TABLET | Freq: Every day | ORAL | 1 refills | Status: DC

## 2018-04-11 MED ORDER — ISOSORBIDE MONONITRATE ER 30 MG PO TB24: 30.0000 mg | ORAL_TABLET | Freq: Every day | ORAL | 1 refills | Status: DC

## 2018-04-11 NOTE — Patient Instructions (Signed)
Medication Instructions:  START Carvedilol 6.25 mg twice daily.  If you need a refill on your cardiac medications before your next appointment, please call your pharmacy.   Lab work: NONE If you have labs (blood work) drawn today and your tests are completely normal, you will receive your results only by: Marland Kitchen MyChart Message (if you have MyChart) OR . A paper copy in the mail If you have any lab test that is abnormal or we need to change your treatment, we will call you to review the results.  Testing/Procedures: NONE  Follow-Up: At [facility], you and your health needs are our priority.  As part of our continuing mission to provide you with exceptional heart care, we have created designated Provider Care Teams.  These Care Teams include your primary Cardiologist (physician) and Advanced Practice Providers (APPs -  Physician Assistants and Nurse Practitioners) who all work together to provide you with the care you need, when you need it. You will need a follow up appointment in 6 months. You may see Peter [name], MD or one of the following Advanced Practice Providers on your designated Care Team: [place], [place] . Fabian Sharp, PA-C  Any Other Special Instructions Will Be Listed Below (If Applicable). NONE

## 2018-04-11 NOTE — Telephone Encounter (Signed)
LEFT MESSAGE FOR PATIENT TO NOT FILL THE RX FOR COREG.

## 2018-04-15 ENCOUNTER — Telehealth: Payer: Self-pay | Admitting: Neurology

## 2018-04-15 NOTE — Telephone Encounter (Signed)
Patient requesting written Rx for memantine (NAMENDA) 10 MG tablet be mailed to patient.

## 2018-04-15 NOTE — Telephone Encounter (Signed)
Unable to reach patient. Rn needs to know reason pt does not want medication sent to pharmacy. PT wants in mail to her address. Will call back later.

## 2018-04-16 ENCOUNTER — Other Ambulatory Visit: Payer: Self-pay

## 2018-04-16 MED ORDER — MEMANTINE HCL 10 MG PO TABS: 10.0000 mg | ORAL_TABLET | Freq: Two times a day (BID) | ORAL | 1 refills | Status: DC

## 2018-04-16 NOTE — Telephone Encounter (Signed)
RN spoke with Dr. Jannifer Franklin nurse and prescription cannot be mailed out. Rn will do a refill via computer.Namenda sent to pharmacy listed.

## 2018-04-29 DIAGNOSIS — K640 First degree hemorrhoids: Secondary | ICD-10-CM | POA: Diagnosis not present

## 2018-04-29 DIAGNOSIS — Z85038 Personal history of other malignant neoplasm of large intestine: Secondary | ICD-10-CM | POA: Diagnosis not present

## 2018-04-29 DIAGNOSIS — D126 Benign neoplasm of colon, unspecified: Secondary | ICD-10-CM | POA: Diagnosis not present

## 2018-05-01 DIAGNOSIS — D126 Benign neoplasm of colon, unspecified: Secondary | ICD-10-CM | POA: Diagnosis not present

## 2018-05-02 ENCOUNTER — Other Ambulatory Visit: Payer: Self-pay | Admitting: Neurology

## 2018-05-14 ENCOUNTER — Other Ambulatory Visit: Payer: Self-pay | Admitting: Adult Health

## 2018-05-18 ENCOUNTER — Other Ambulatory Visit: Payer: Self-pay | Admitting: Adult Health

## 2018-06-04 ENCOUNTER — Ambulatory Visit: Payer: Self-pay | Admitting: Adult Health

## 2018-06-05 ENCOUNTER — Encounter: Payer: Self-pay | Admitting: Adult Health

## 2018-06-19 ENCOUNTER — Other Ambulatory Visit: Payer: Self-pay | Admitting: Neurology

## 2018-06-20 ENCOUNTER — Other Ambulatory Visit: Payer: Self-pay | Admitting: Neurology

## 2018-07-01 ENCOUNTER — Other Ambulatory Visit: Payer: Self-pay | Admitting: Cardiology

## 2018-07-23 ENCOUNTER — Other Ambulatory Visit: Payer: Self-pay | Admitting: Neurology

## 2018-07-30 ENCOUNTER — Other Ambulatory Visit: Payer: Self-pay | Admitting: Cardiology

## 2018-07-30 ENCOUNTER — Other Ambulatory Visit: Payer: Self-pay

## 2018-07-30 MED ORDER — EZETIMIBE 10 MG PO TABS: 10.0000 mg | ORAL_TABLET | Freq: Every day | ORAL | 2 refills | Status: DC

## 2018-08-03 ENCOUNTER — Other Ambulatory Visit: Payer: Self-pay | Admitting: Adult Health

## 2018-08-05 ENCOUNTER — Other Ambulatory Visit: Payer: Self-pay | Admitting: *Deleted

## 2018-08-05 MED ORDER — EZETIMIBE 10 MG PO TABS: 10.0000 mg | ORAL_TABLET | Freq: Every day | ORAL | 1 refills | Status: DC

## 2018-08-05 NOTE — Telephone Encounter (Signed)
LMVM for pt to return call, needs appt scheduled.

## 2018-08-05 NOTE — Telephone Encounter (Signed)
Rx has been sent to the pharmacy electronically. ° °

## 2018-08-07 NOTE — Telephone Encounter (Addendum)
Spoke to pt and made appt for her with SS/NP last seen 10/2017, RV was 6 mo, now almost annual RV. Will refill zarontin.

## 2018-08-13 NOTE — Progress Notes (Signed)
PATIENT: Rachel Clark DOB: 10-13-1950  REASON FOR VISIT: follow up HISTORY FROM: patient  HISTORY OF PRESENT ILLNESS: Today 08/13/18  Rachel Clark is a 68 year old female with history of seizures and memory disturbance. MMSE 26/30 in April 2019.  She is taking Tegretol XR 3 tablets by mouth twice a day and Zarontin 250 mg 2 capsules in the morning and 2 capsules at night.  She is tolerating these medications well and she reports her last seizure was when she was age 11.  She operates a Teacher, music without difficulty.  She currently works part-time and enjoys her work.  She lives with her husband and reports she is able to complete all of her ADLs.  She denies any trouble with her memory.  She reports that she took herself off the [place].  She reports that she took herself off because she did not notice a change in her memory and that it was $50 a month.  She reports that nobody at her work notices her memory and that she has no problems meeting her deadlines at work.  She reports that she sleeps well.  She has a good appetite.  She denies any problems or concerns today.  She presents for follow-up today unaccompanied.  HISTORY (April 2019, MM)  Rachel Clark is a 68 year old female with a history of seizures and memory disturbance.  She returns today for follow-up.  She continues on carbamazepine and Neurontin.  She reports that she tolerates the medication well.  She operates a Teacher, music without difficulty.  Denies any seizure events.  She is able to complete all ADLs independently.  Denies any trouble with her sleep. She feels that her memory has remained stable.  She states that she continues to perform well at her job.  She remains on Namenda.  She denies any new neurological symptoms.  Returns today for an evaluation.  REVIEW OF SYSTEMS: Out of a complete 14 system review of symptoms, the patient complains only of the following symptoms, and all other reviewed systems are  negative.    ALLERGIES: Allergies  Allergen Reactions  . Aspirin Other (See Comments)    GI upset- can tolerate 81 mg ASA, just not full doses  . Erythromycin Nausea Only    Gi upset  . Lisinopril Cough  . Niacin And Related Other (See Comments)    Pt does not recall reactions   . Statins Other (See Comments)    Muscle pain  . Sulfa Drugs Cross Reactors Swelling  . Tetracyclines & Related Nausea Only  . Penicillins Nausea Only and Rash    Has patient had a PCN reaction causing immediate rash, facial/tongue/throat swelling, SOB or lightheadedness with hypotension: Yes Has patient had a PCN reaction causing severe rash involving mucus membranes or skin necrosis: No Has patient had a PCN reaction that required hospitalization No Has patient had a PCN reaction occurring within the last 10 years: No If all of the above answers are "NO", then may proceed with Cephalosporin use.   Marland Kitchen Plavix [Clopidogrel Bisulfate] Rash    HOME MEDICATIONS: Outpatient Medications Prior to Visit  Medication Sig Dispense Refill  . acetaminophen (TYLENOL) 500 MG tablet Take 500-1,000 mg by mouth daily as needed for mild pain or headache.    Marland Kitchen amLODipine (NORVASC) 10 MG tablet Take 1 tablet (10 mg total) by mouth daily. 90 tablet 1  . aspirin 81 MG chewable tablet Chew 1 tablet (81 mg total) by mouth daily.    Marland Kitchen  Azelastine HCl 0.15 % SOLN Place 1 spray into both nostrils daily as needed (seasonal allergies).   12  . carbamazepine (TEGRETOL XR) 200 MG 12 hr tablet TAKE 3 TABLETS BY MOUTH TWICE A DAY 180 tablet 0  . carvedilol (COREG) 6.25 MG tablet Take 1 tablet (6.25 mg total) by mouth 2 (two) times daily with a meal. 90 tablet 1  . DULoxetine (CYMBALTA) 60 MG capsule Take 60 mg by mouth daily.     Marland Kitchen estradiol (VIVELLE-DOT) 0.05 MG/24HR patch Place 1 patch onto the skin 2 (two) times a week. Sunday, Wednesday    . ethosuximide (ZARONTIN) 250 MG capsule TAKE 2 CAPSULE (500MG ) IN THE MORNING, AND 2 CAPSULES  (500MG ) AT NIGHT 120 capsule 0  . ezetimibe (ZETIA) 10 MG tablet Take 1 tablet (10 mg total) by mouth daily. 90 tablet 1  . ibuprofen (ADVIL,MOTRIN) 600 MG tablet Take 1 tablet (600 mg total) by mouth every 8 (eight) hours as needed. (Patient taking differently: Take 600 mg by mouth every 8 (eight) hours as needed for headache or mild pain. ) 15 tablet 0  . isosorbide mononitrate (IMDUR) 30 MG 24 hr tablet TAKE 1 TABLET (30 MG TOTAL) BY MOUTH DAILY. PLEASE SCHEDULE APPT WITH DR. [name] FOR REFILLS. 90 tablet 0  . levothyroxine (SYNTHROID, LEVOTHROID) 50 MCG tablet Take 50 mcg by mouth daily.     . memantine (NAMENDA) 10 MG tablet Take 1 tablet (10 mg total) by mouth 2 (two) times daily. 180 tablet 1  . nitroGLYCERIN (NITROSTAT) 0.4 MG SL tablet Place 1 tablet (0.4 mg total) under the tongue every 5 (five) minutes as needed. For chest pain. 25 tablet 6  . rosuvastatin (CRESTOR) 5 MG tablet Take 1 tablet by mouth up to 3 times weekly as directed 12 tablet 3   No facility-administered medications prior to visit.     PAST MEDICAL HISTORY: Past Medical History:  Diagnosis Date  . Arthritis    "fingers" (06/29/2016)  . Chronic lower back pain   . Colon cancer ([place]) 12/04/2011   s/p Laparoscopic-assisted transverse colectomy on 12/19/2011 by Dr. Donne Hazel.  pT3 N0 M0.   . Coronary artery disease    a. remote NSTEMI with PCI to the RCA; b. S/P CABG in 2007; c. 2010 - 2 v CAD with L-LAD and S-RCA patent, small caliber Dx 70% (treated medically - no amenable to PCI), CFX free of significant disease, normal LVF. d. 10/2014 Lexi MV: EF 61%, no ischemia/infarct.  . Depression   . Dyslipidemia   . Fibromyalgia   . Gastric ulcer   . GERD (gastroesophageal reflux disease)   . 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 Headache    "weekly" (06/29/2016)  . Hyperlipidemia   . Hypothyroid   . Iron deficiency anemia 11/17/2011  . Memory change  11/13/2016  . Monoclonal gammopathy of unknown significance   . Myocardial infarction ([place]) 2007  . Presence of permanent cardiac pacemaker   . Syncope and collapse    pacemaker implanted  . Unstable angina ([place])     PAST SURGICAL HISTORY: Past Surgical History:  Procedure Laterality Date  . ANTERIOR CERVICAL DECOMP/DISCECTOMY FUSION  1980's  . BACK SURGERY    . CARDIAC CATHETERIZATION  05/20/2009   obstructive native vessel disease in LAD, RCA, and first diagonal, patent vein graft to distal RCA and LIMA to LAD,normal. ef 60%  . CARDIAC CATHETERIZATION N/A 03/24/2015  Procedure: Left Heart Cath and Cors/Grafts Angiography;  Surgeon: Wellington Hampshire, MD;  Location: [facility];  Service: Cardiovascular;  Laterality: N/A;  . CARDIAC CATHETERIZATION N/A 07/28/2015   Procedure: Left Heart Cath and Coronary Angiography;  Surgeon: Troy Sine, MD;  Location: [facility];  Service: Cardiovascular;  Laterality: N/A;  . CARDIAC CATHETERIZATION N/A 11/09/2015   Procedure: Coronary Stent Intervention;  Surgeon: Sherren Mocha, MD;  Location: [facility];  Service: Cardiovascular;  Laterality: N/A;  . CARDIAC CATHETERIZATION N/A 11/09/2015   Procedure: Left Heart Cath and Coronary Angiography;  Surgeon: Sherren Mocha, MD;  Location: [facility];  Service: Cardiovascular;  Laterality: N/A;  . CARDIAC CATHETERIZATION N/A 06/29/2016   Procedure: Left Heart Cath and Coronary Angiography;  Surgeon: Nelva Bush, MD;  Location: [facility];  Service: Cardiovascular;  Laterality: N/A;  . CARDIAC CATHETERIZATION N/A 06/29/2016   Procedure: Intravascular Pressure Wire/FFR Study;  Surgeon: Nelva Bush, MD;  Location: [facility];  Service: Cardiovascular;  Laterality: N/A;  . CARDIAC CATHETERIZATION N/A 06/29/2016   Procedure: Coronary Balloon Angioplasty;  Surgeon: Nelva Bush, MD;  Location: [facility];  Service: Cardiovascular;  Laterality: N/A;   . COLON RESECTION  12/19/2011   Procedure: COLON RESECTION LAPAROSCOPIC;  Surgeon: Rolm Bookbinder, MD;  Location: [facility];  Service: General;  Laterality: N/A;  laparoscopic hand assisted partial colon resection  . COLON SURGERY    . COLONOSCOPY    . CORONARY ANGIOPLASTY WITH STENT PLACEMENT  ~ 2007   1 stent  . CORONARY ARTERY BYPASS GRAFT  2007   "CABG X2"  . DILATION AND CURETTAGE OF UTERUS  1973  . EP IMPLANTABLE DEVICE N/A 03/25/2015   MDT Advisa DR pacemaker implanted by Dr Rayann Heman for transient complete heart block and syncope  . INSERT / REPLACE / REMOVE PACEMAKER    . LEFT HEART CATH AND CORS/GRAFTS ANGIOGRAPHY N/A 02/21/2018   Procedure: LEFT HEART CATH AND CORS/GRAFTS ANGIOGRAPHY;  Surgeon: Lorretta Harp, MD;  Location: [facility];  Service: Cardiovascular;  Laterality: N/A;  . PORT-A-CATH REMOVAL N/A 06/12/2013   Procedure: REMOVAL PORT-A-CATH;  Surgeon: Rolm Bookbinder, MD;  Location: [facility];  Service: General;  Laterality: N/A;  . PORTACATH PLACEMENT  06/04/2012   Procedure: INSERTION PORT-A-CATH;  Surgeon: Rolm Bookbinder, MD;  Location: [facility];  Service: General;  Laterality: N/A;  Insertion of port-a-cath   . POSTERIOR LAMINECTOMY / DECOMPRESSION CERVICAL SPINE  1990s  . VAGINAL HYSTERECTOMY      FAMILY HISTORY: Family History  Problem Relation Age of Onset  . Heart attack Father   . Heart disease Father   . Heart attack Brother 54  . Cancer Mother        lung  . Stroke Neg Hx     SOCIAL HISTORY: Social History   Socioeconomic History  . Marital status: Married    Spouse name: Not on file  . Number of children: 2  . Years of education: 49  . Highest education level: Not on file  Occupational History  . 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 Financial resource strain: Not on file  . Food insecurity:    Worry: Not on file    Inability: Not on file  .  Transportation needs:    Medical: Not on file    Non-medical: Not on file  Tobacco Use  . Smoking status: Former Smoker    Packs/day: 0.30    Years: 20.00    Pack years: 6.00    Types: Cigarettes    Last attempt to quit: 06/06/1981    Years since quitting: 37.2  . Smokeless tobacco: Never Used  Substance and Sexual Activity  . Alcohol use: Yes    Alcohol/week: 1.0 standard drinks    Types: 1 Glasses of wine per week  . Drug use: No  . Sexual activity: Not Currently    Birth control/protection: Surgical  Lifestyle  . Physical activity:    Days per week: Not on file    Minutes per session: Not on file  . Stress: Not on file  Relationships  . Social connections:    Talks on phone: Not on file    Gets together: Not on file    Attends religious service: Not on file    Active member of club or organization: Not on file    Attends meetings of clubs or organizations: Not on file    Relationship status: Not on file  . Intimate partner violence:    Fear of current or ex partner: Not on file    Emotionally abused: Not on file    Physically abused: Not on file    Forced sexual activity: Not on file  Other Topics Concern  . Not on file  Social History Narrative   Patient is married with 2 children.   Patient is right handed.   Patient has a high school education with some college education.   Patient drinks 2 cups daily.    PHYSICAL EXAM  There were no vitals filed for this visit. There is no height or weight on file to calculate BMI.  Generalized: Well developed, in no acute distress   Neurological examination  Mentation: Alert oriented to time, place, history taking. Follows all commands speech and language fluent Cranial nerve II-XII: Pupils were equal round reactive to light. Extraocular movements were full, visual field were full on confrontational test. Facial sensation and strength were normal. Uvula tongue midline. Head turning and shoulder shrug  were normal and  symmetric. Motor: The motor testing reveals 5 over 5 strength of all 4 extremities. Good symmetric motor tone is noted throughout.  Sensory: Sensory testing is intact to soft touch on all 4 extremities. No evidence of extinction is noted.  Coordination: Cerebellar testing reveals good finger-nose-finger and heel-to-shin bilaterally.  Gait and station: Gait is normal. Tandem gait is normal. Romberg is negative. No drift is seen.  Reflexes: Deep tendon reflexes are symmetric and normal bilaterally.   DIAGNOSTIC DATA (LABS, IMAGING, TESTING) - I reviewed patient records, labs, notes, testing and imaging myself where available.  Lab Results  Component Value Date   WBC 9.2 02/23/2018   HGB 14.5 02/23/2018   HCT 44.2 02/23/2018   MCV 90.4 02/23/2018   PLT 310 02/23/2018      Component Value Date/Time   NA 134 (L) 02/23/2018 1545   NA 140 10/29/2017 0909   NA 137 07/11/2016 0820   K 4.2 02/23/2018 1545   K 4.4 07/11/2016 0820   CL 100 02/23/2018 1545   CL 106 12/25/2012 0924   CO2 26 02/23/2018 1545   CO2 27 07/11/2016 0820   GLUCOSE 134 (H) 02/23/2018 1545   GLUCOSE 115 07/11/2016 0820   GLUCOSE 105 (H) 12/25/2012 0924   BUN 15 02/23/2018 1545   BUN 15 10/29/2017 0909   BUN 15.6 07/11/2016 0820  CREATININE 0.71 02/23/2018 1545   CREATININE 0.8 07/11/2016 0820   CALCIUM 8.7 (L) 02/23/2018 1545   CALCIUM 8.9 07/11/2016 0820   PROT 6.6 02/20/2018 2207   PROT 7.0 10/29/2017 0909   PROT 7.1 07/11/2016 0820   ALBUMIN 3.4 (L) 02/20/2018 2207   ALBUMIN 4.0 10/29/2017 0909   ALBUMIN 3.6 07/11/2016 0820   AST 15 02/20/2018 2207   AST 13 07/11/2016 0820   ALT 17 02/20/2018 2207   ALT 14 07/11/2016 0820   ALKPHOS 72 02/20/2018 2207   ALKPHOS 89 07/11/2016 0820   BILITOT 0.6 02/20/2018 2207   BILITOT <0.2 10/29/2017 0909   BILITOT 0.29 07/11/2016 0820   GFRNONAA >60 02/23/2018 1545   GFRAA >60 02/23/2018 1545   Lab Results  Component Value Date   CHOL 229 (H) 02/21/2018    HDL 72 02/21/2018   LDLCALC 138 (H) 02/21/2018   TRIG 97 02/21/2018   CHOLHDL 3.2 02/21/2018   Lab Results  Component Value Date   HGBA1C 5.5 02/20/2018   Lab Results  Component Value Date   VITAMINB12 290 11/13/2016   Lab Results  Component Value Date   TSH 1.206 02/20/2018    ASSESSMENT AND PLAN 68 y.o. 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 here with:  1.  Seizures 2.  Memory loss  Overall she is doing quite well.  She remains stable on her current dose of Tegretol-XR and Zarontin and she will continue these medications.  I will check lab work today (CBC, CMP, and drug levels).  Her MMSE today was 26/30 with 11 animals.  Her memory score is stable from her last several visits.  She is no longer taking Namenda as she saw no benefit.  We discussed that the best way to preserve memory is to live a healthy lifestyle by eating right, being active, and managing risk factors.  She is requesting to have a follow-up in 1 year because she is doing so well.  She will follow-up in 1 year or sooner if needed. I advised her that if her symptoms should worsen or if she develops any new symptoms she should let us know.  I spent 15 minutes with the patient. 50% of this time was spent discussing her plan of care.   Butler Denmark, AGNP-C, DNP 08/13/2018, 2:42 PM [facility] [address], [place], [place] [address] [phone]

## 2018-08-14 ENCOUNTER — Other Ambulatory Visit: Payer: Self-pay

## 2018-08-14 ENCOUNTER — Ambulatory Visit (INDEPENDENT_AMBULATORY_CARE_PROVIDER_SITE_OTHER): Payer: Medicare Other | Admitting: Neurology

## 2018-08-14 ENCOUNTER — Encounter: Payer: Self-pay | Admitting: Neurology

## 2018-08-14 VITALS — BP 126/83 | HR 90 | Resp 18 | Ht 69.0 in | Wt 195.0 lb

## 2018-08-14 DIAGNOSIS — R569 Unspecified convulsions: Secondary | ICD-10-CM

## 2018-08-14 DIAGNOSIS — R413 Other amnesia: Secondary | ICD-10-CM | POA: Diagnosis not present

## 2018-08-14 NOTE — Progress Notes (Signed)
I have read the note, and I agree with the clinical assessment and plan.  Aseel Uhde K Nery Frappier   

## 2018-08-15 ENCOUNTER — Telehealth: Payer: Self-pay | Admitting: Neurology

## 2018-08-15 LAB — COMPREHENSIVE METABOLIC PANEL
ALK PHOS: 94 IU/L (ref 39–117)
ALT: 19 IU/L (ref 0–32)
AST: 14 IU/L (ref 0–40)
Albumin/Globulin Ratio: 1.5 (ref 1.2–2.2)
Albumin: 4.4 g/dL (ref 3.8–4.8)
BUN/Creatinine Ratio: 27 (ref 12–28)
BUN: 20 mg/dL (ref 8–27)
Bilirubin Total: <0.2 mg/dL (ref 0.0–1.2)
CO2: 23 mmol/L (ref 20–29)
Calcium: 9.1 mg/dL (ref 8.7–10.3)
Chloride: 100 mmol/L (ref 96–106)
Creatinine, Ser: 0.73 mg/dL (ref 0.57–1.00)
GFR calc Af Amer: 97 mL/min/{1.73_m2} (ref >59)
GFR calc non Af Amer: 84 mL/min/{1.73_m2} (ref >59)
Globulin, Total: 2.9 g/dL (ref 1.5–4.5)
Glucose: 102 mg/dL — ABNORMAL HIGH (ref 65–99)
Potassium: 4.9 mmol/L (ref 3.5–5.2)
Sodium: 138 mmol/L (ref 134–144)
Total Protein: 7.3 g/dL (ref 6.0–8.5)

## 2018-08-15 LAB — CBC WITH DIFFERENTIAL/PLATELET
BASOS ABS: 0.1 10*3/uL (ref 0.0–0.2)
Basos: 1 %
EOS (ABSOLUTE): 0.3 10*3/uL (ref 0.0–0.4)
Eos: 5 %
Hematocrit: 40.3 % (ref 34.0–46.6)
Hemoglobin: 13.9 g/dL (ref 11.1–15.9)
Immature Grans (Abs): 0 10*3/uL (ref 0.0–0.1)
Immature Granulocytes: 0 %
Lymphocytes Absolute: 2.5 10*3/uL (ref 0.7–3.1)
Lymphs: 34 %
MCH: 29.8 pg (ref 26.6–33.0)
MCHC: 34.5 g/dL (ref 31.5–35.7)
MCV: 87 fL (ref 79–97)
Monocytes Absolute: 0.6 10*3/uL (ref 0.1–0.9)
Monocytes: 9 %
Neutrophils Absolute: 3.8 10*3/uL (ref 1.4–7.0)
Neutrophils: 51 %
Platelets: 356 10*3/uL (ref 150–450)
RBC: 4.66 x10E6/uL (ref 3.77–5.28)
RDW: 12.4 % (ref 11.7–15.4)
WBC: 7.3 10*3/uL (ref 3.4–10.8)

## 2018-08-15 LAB — ETHOSUXIMIDE LEVEL: Ethosuximide Lvl: 72 ug/mL (ref 40–100)

## 2018-08-15 LAB — CARBAMAZEPINE LEVEL, TOTAL: Carbamazepine (Tegretol), S: 9.8 ug/mL (ref 4.0–12.0)

## 2018-08-15 NOTE — Telephone Encounter (Signed)
I called the patient and let her know that her lab work was unremarkable. Her carbmazepine level was

## 2018-08-15 NOTE — Telephone Encounter (Signed)
I let her know that her carbamazepine level was 9.8. She appreciated the call.

## 2018-08-24 ENCOUNTER — Other Ambulatory Visit: Payer: Self-pay | Admitting: Neurology

## 2018-09-01 ENCOUNTER — Other Ambulatory Visit: Payer: Self-pay | Admitting: Adult Health

## 2018-09-22 ENCOUNTER — Other Ambulatory Visit: Payer: Self-pay | Admitting: Cardiology

## 2018-09-30 ENCOUNTER — Telehealth: Payer: Self-pay

## 2018-09-30 NOTE — Telephone Encounter (Signed)
-----   Message from Heath Lark, MD sent at 09/30/2018 10:45 AM EDT ----- Regarding: cancel and reschedule to July Call her. If she feels ok, reschedule appt to July. Labs to be done a week before appt

## 2018-09-30 NOTE — Telephone Encounter (Signed)
Called and given below message. She verbalized understanding. Scheduling message sent. 

## 2018-10-01 ENCOUNTER — Other Ambulatory Visit: Payer: Medicare Other

## 2018-10-02 ENCOUNTER — Telehealth: Payer: Self-pay | Admitting: Hematology and Oncology

## 2018-10-02 NOTE — Telephone Encounter (Signed)
Scheduled appt per 3/30 sch message - left message and sent reminder letter in the mail  

## 2018-10-08 ENCOUNTER — Ambulatory Visit: Payer: Medicare Other | Admitting: Hematology and Oncology

## 2018-10-23 ENCOUNTER — Telehealth: Payer: Self-pay | Admitting: Physician Assistant

## 2018-10-23 ENCOUNTER — Encounter: Payer: Self-pay | Admitting: Cardiology

## 2018-10-23 NOTE — Telephone Encounter (Signed)
Called patient regarding overdue pacemaker transmission.  The patient confirmed her identiy with correct birth date.  She was surprised to hear it.  She does not have a home transmitter, can not recall the last time she saw it.  She would appreciate a new transmitter to be mailed to her.  I will send to the DC to request a new transmitter and schedule remote transmission once received.   The patient reports feeling well, without complaints.  She is staying home, no symptoms of illness. Discussed importance during [place] to keep diligent and of she develops any symptoms to reach out to her PMD for guidance.  She was very Patent attorney.  She will call once she receives the transmitter to get back on track with her pacer checks.  Tommye Standard, PA-C

## 2018-10-25 ENCOUNTER — Telehealth: Payer: Self-pay | Admitting: Cardiology

## 2018-10-25 NOTE — Telephone Encounter (Signed)
-----   Message from [place], [place] sent at 10/23/2018  2:27 PM EDT ----- Can we please request a transmitter be mailed to her?  She does not recall having one, but see she has done remotes before.... She doesn't have one now.  Told her to call once she gets it to get scheduled for remote transmission.  Thanks renee

## 2018-10-25 NOTE — Telephone Encounter (Signed)
Called [employer] support. New monitor ordered. Staff message sent to myself to follow up w/ pt in 2 weeks.

## 2018-10-31 ENCOUNTER — Ambulatory Visit: Payer: Medicare Other | Admitting: Adult Health

## 2018-11-28 DIAGNOSIS — M797 Fibromyalgia: Secondary | ICD-10-CM | POA: Diagnosis not present

## 2018-11-28 DIAGNOSIS — F325 Major depressive disorder, single episode, in full remission: Secondary | ICD-10-CM | POA: Diagnosis not present

## 2018-11-28 DIAGNOSIS — I10 Essential (primary) hypertension: Secondary | ICD-10-CM | POA: Diagnosis not present

## 2018-11-28 DIAGNOSIS — D472 Monoclonal gammopathy: Secondary | ICD-10-CM | POA: Diagnosis not present

## 2018-11-28 DIAGNOSIS — G40909 Epilepsy, unspecified, not intractable, without status epilepticus: Secondary | ICD-10-CM | POA: Diagnosis not present

## 2018-11-28 DIAGNOSIS — I2510 Atherosclerotic heart disease of native coronary artery without angina pectoris: Secondary | ICD-10-CM | POA: Diagnosis not present

## 2018-11-28 DIAGNOSIS — E785 Hyperlipidemia, unspecified: Secondary | ICD-10-CM | POA: Diagnosis not present

## 2018-11-28 DIAGNOSIS — E559 Vitamin D deficiency, unspecified: Secondary | ICD-10-CM | POA: Diagnosis not present

## 2018-11-28 DIAGNOSIS — E039 Hypothyroidism, unspecified: Secondary | ICD-10-CM | POA: Diagnosis not present

## 2018-12-12 ENCOUNTER — Telehealth: Payer: Self-pay

## 2018-12-12 NOTE — Telephone Encounter (Signed)
Called and left below message. Ask her to call the office back. 

## 2018-12-12 NOTE — Telephone Encounter (Signed)
-----   Message from Heath Lark, MD sent at 12/12/2018  8:17 AM EDT ----- Regarding: appt on 7/14 Can she come in earlier that day? Thanks

## 2018-12-17 NOTE — Telephone Encounter (Signed)
Spoke to patient. She agreed to come in at 1245 on 7/14 instead

## 2018-12-31 DIAGNOSIS — N6314 Unspecified lump in the right breast, lower inner quadrant: Secondary | ICD-10-CM | POA: Diagnosis not present

## 2018-12-31 DIAGNOSIS — N6001 Solitary cyst of right breast: Secondary | ICD-10-CM | POA: Diagnosis not present

## 2019-01-06 ENCOUNTER — Other Ambulatory Visit: Payer: Self-pay | Admitting: Hematology and Oncology

## 2019-01-06 DIAGNOSIS — D472 Monoclonal gammopathy: Secondary | ICD-10-CM

## 2019-01-06 DIAGNOSIS — Z85038 Personal history of other malignant neoplasm of large intestine: Secondary | ICD-10-CM

## 2019-01-07 ENCOUNTER — Other Ambulatory Visit: Payer: Self-pay

## 2019-01-07 ENCOUNTER — Inpatient Hospital Stay: Payer: Medicare Other | Attending: Hematology and Oncology

## 2019-01-07 DIAGNOSIS — Z791 Long term (current) use of non-steroidal anti-inflammatories (NSAID): Secondary | ICD-10-CM | POA: Insufficient documentation

## 2019-01-07 DIAGNOSIS — Z85038 Personal history of other malignant neoplasm of large intestine: Secondary | ICD-10-CM

## 2019-01-07 DIAGNOSIS — Z79899 Other long term (current) drug therapy: Secondary | ICD-10-CM | POA: Insufficient documentation

## 2019-01-07 DIAGNOSIS — I10 Essential (primary) hypertension: Secondary | ICD-10-CM | POA: Diagnosis not present

## 2019-01-07 DIAGNOSIS — Z7982 Long term (current) use of aspirin: Secondary | ICD-10-CM | POA: Diagnosis not present

## 2019-01-07 DIAGNOSIS — D472 Monoclonal gammopathy: Secondary | ICD-10-CM | POA: Insufficient documentation

## 2019-01-07 LAB — COMPREHENSIVE METABOLIC PANEL
ALT: 14 U/L (ref 0–44)
AST: 12 U/L — ABNORMAL LOW (ref 15–41)
Albumin: 3.7 g/dL (ref 3.5–5.0)
Alkaline Phosphatase: 103 U/L (ref 38–126)
Anion gap: 9 (ref 5–15)
BUN: 14 mg/dL (ref 8–23)
CO2: 26 mmol/L (ref 22–32)
Calcium: 8.6 mg/dL — ABNORMAL LOW (ref 8.9–10.3)
Chloride: 100 mmol/L (ref 98–111)
Creatinine, Ser: 0.77 mg/dL (ref 0.44–1.00)
GFR calc Af Amer: >60 mL/min (ref >60)
GFR calc non Af Amer: >60 mL/min (ref >60)
Glucose, Bld: 110 mg/dL — ABNORMAL HIGH (ref 70–99)
Potassium: 4.2 mmol/L (ref 3.5–5.1)
Sodium: 135 mmol/L (ref 135–145)
Total Bilirubin: <0.2 mg/dL — ABNORMAL LOW (ref 0.3–1.2)
Total Protein: 7.5 g/dL (ref 6.5–8.1)

## 2019-01-07 LAB — CBC WITH DIFFERENTIAL/PLATELET
Abs Immature Granulocytes: 0.02 10*3/uL (ref 0.00–0.07)
Basophils Absolute: 0.1 10*3/uL (ref 0.0–0.1)
Basophils Relative: 1 %
Eosinophils Absolute: 0.4 10*3/uL (ref 0.0–0.5)
Eosinophils Relative: 4 %
HCT: 43.4 % (ref 36.0–46.0)
Hemoglobin: 14.5 g/dL (ref 12.0–15.0)
Immature Granulocytes: 0 %
Lymphocytes Relative: 23 %
Lymphs Abs: 2 10*3/uL (ref 0.7–4.0)
MCH: 30.1 pg (ref 26.0–34.0)
MCHC: 33.4 g/dL (ref 30.0–36.0)
MCV: 90.2 fL (ref 80.0–100.0)
Monocytes Absolute: 0.6 10*3/uL (ref 0.1–1.0)
Monocytes Relative: 7 %
Neutro Abs: 5.8 10*3/uL (ref 1.7–7.7)
Neutrophils Relative %: 65 %
Platelets: 296 10*3/uL (ref 150–400)
RBC: 4.81 MIL/uL (ref 3.87–5.11)
RDW: 12.8 % (ref 11.5–15.5)
WBC: 8.9 10*3/uL (ref 4.0–10.5)
nRBC: 0 % (ref 0.0–0.2)

## 2019-01-07 LAB — CEA (IN HOUSE-CHCC): CEA (CHCC-In House): 1.99 ng/mL (ref 0.00–5.00)

## 2019-01-08 LAB — MULTIPLE MYELOMA PANEL, SERUM
Albumin SerPl Elph-Mcnc: 3.6 g/dL (ref 2.9–4.4)
Albumin/Glob SerPl: 1.2 (ref 0.7–1.7)
Alpha 1: 0.2 g/dL (ref 0.0–0.4)
Alpha2 Glob SerPl Elph-Mcnc: 0.8 g/dL (ref 0.4–1.0)
B-Globulin SerPl Elph-Mcnc: 0.9 g/dL (ref 0.7–1.3)
Gamma Glob SerPl Elph-Mcnc: 1.2 g/dL (ref 0.4–1.8)
Globulin, Total: 3.1 g/dL (ref 2.2–3.9)
IgA: 181 mg/dL (ref 87–352)
IgG (Immunoglobin G), Serum: 1293 mg/dL (ref 586–1602)
IgM (Immunoglobulin M), Srm: 86 mg/dL (ref 26–217)
M Protein SerPl Elph-Mcnc: 0.5 g/dL — ABNORMAL HIGH
Total Protein ELP: 6.7 g/dL (ref 6.0–8.5)

## 2019-01-08 LAB — KAPPA/LAMBDA LIGHT CHAINS
Kappa free light chain: 28.8 mg/L — ABNORMAL HIGH (ref 3.3–19.4)
Kappa, lambda light chain ratio: 1.66 — ABNORMAL HIGH (ref 0.26–1.65)
Lambda free light chains: 17.3 mg/L (ref 5.7–26.3)

## 2019-01-14 ENCOUNTER — Other Ambulatory Visit: Payer: Self-pay

## 2019-01-14 ENCOUNTER — Inpatient Hospital Stay (HOSPITAL_BASED_OUTPATIENT_CLINIC_OR_DEPARTMENT_OTHER): Payer: Medicare Other | Admitting: Hematology and Oncology

## 2019-01-14 ENCOUNTER — Encounter: Payer: Self-pay | Admitting: Hematology and Oncology

## 2019-01-14 DIAGNOSIS — Z85038 Personal history of other malignant neoplasm of large intestine: Secondary | ICD-10-CM

## 2019-01-14 DIAGNOSIS — Z7982 Long term (current) use of aspirin: Secondary | ICD-10-CM | POA: Diagnosis not present

## 2019-01-14 DIAGNOSIS — D472 Monoclonal gammopathy: Secondary | ICD-10-CM

## 2019-01-14 DIAGNOSIS — I10 Essential (primary) hypertension: Secondary | ICD-10-CM

## 2019-01-14 DIAGNOSIS — Z791 Long term (current) use of non-steroidal anti-inflammatories (NSAID): Secondary | ICD-10-CM

## 2019-01-14 DIAGNOSIS — Z79899 Other long term (current) drug therapy: Secondary | ICD-10-CM | POA: Diagnosis not present

## 2019-01-14 NOTE — Assessment & Plan Note (Signed)
She has no signs of disease progression on blood work and physical examination Myeloma workup is stable For next year, I recommend she return a week before her scheduled appointment to get blood work done ahead of time We discussed the importance of annual influenza vaccination

## 2019-01-14 NOTE — Progress Notes (Signed)
[place] OFFICE PROGRESS NOTE  Patient Care Team: Harlan Stains, MD as PCP - General (Family Medicine) [name], Peter M, MD as PCP - Cardiology (Cardiology) Wilford Corner, MD as Consulting Physician (Gastroenterology)  ASSESSMENT & PLAN:  History of colon cancer CEA level is normal She is not symptomatic I will defer to her GI doctor for further follow-up  Monoclonal gammopathy of unknown significance She has no signs of disease progression on blood work and physical examination Myeloma workup is stable For next year, I recommend she return a week before her scheduled appointment to get blood work done ahead of time We discussed the importance of annual influenza vaccination    Orders Placed This Encounter  Procedures  . Comprehensive metabolic panel    Standing Status:   Future    Standing Expiration Date:   02/18/2020  . CBC with Differential/Platelet    Standing Status:   Future    Standing Expiration Date:   02/18/2020  . Kappa/lambda light chains    Standing Status:   Future    Standing Expiration Date:   02/18/2020  . Multiple Myeloma Panel (SPEP&IFE w/QIG)    Standing Status:   Future    Standing Expiration Date:   02/18/2020    INTERVAL HISTORY: Please see below for problem oriented charting. She is being followed for MGUS and history of colon cancer She denies recent infection No new bone pain No recent changes in bowel habits She had negative evaluation by GI last year  SUMMARY OF ONCOLOGIC HISTORY: Oncology History  History of colon cancer  12/04/2011 Initial Diagnosis   Colon cancer   11/03/2014 Imaging   CT scan showed no evidence of cancer recurrence.    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 The patient have very minimal M spike and asymptomatic. #2 T3, N0, M0 colon cancer This was discovered when the patient presented with severe anemia. Colonoscopy revealed abnormal lesion and in 12/19/2011 Dr. Donne Hazel perform  laparoscopic-assisted transverse colectomy. She received adjuvant Xeloda in August 2013 and switched to IV 5-FU in December 2013 due to poor tolerance to Xeloda. Her last chemotherapy is completed by March 2014. She has normal colonoscopy in July 2014. CT scan from 10/29/2013 showed no evidence of recurrence.  REVIEW OF SYSTEMS:   Constitutional: Denies fevers, chills or abnormal weight loss Eyes: Denies blurriness of vision Ears, nose, mouth, throat, and face: Denies mucositis or sore throat Respiratory: Denies cough, dyspnea or wheezes Cardiovascular: Denies palpitation, chest discomfort or lower extremity swelling Gastrointestinal:  Denies nausea, heartburn or change in bowel habits Skin: Denies abnormal skin rashes Lymphatics: Denies new lymphadenopathy or easy bruising Neurological:Denies numbness, tingling or new weaknesses Behavioral/Psych: Mood is stable, no new changes  All other systems were reviewed with the patient and are negative.  I have reviewed the past medical history, past surgical history, social history and family history with the patient and they are unchanged from previous note.  ALLERGIES:  is allergic to aspirin; erythromycin; lisinopril; niacin and related; statins; sulfa drugs cross reactors; tetracyclines & related; penicillins; and plavix [clopidogrel bisulfate].  MEDICATIONS:  Current Outpatient Medications  Medication Sig Dispense Refill  . acetaminophen (TYLENOL) 500 MG tablet Take 500-1,000 mg by mouth daily as needed for mild pain or headache.    Marland Kitchen amLODipine (NORVASC) 10 MG tablet Take 1 tablet (10 mg total) by mouth daily. 90 tablet 1  . aspirin 81 MG chewable tablet Chew 1 tablet (81 mg total) by mouth daily.    Marland Kitchen  Azelastine HCl 0.15 % SOLN Place 1 spray into both nostrils daily as needed (seasonal allergies).   12  . carbamazepine (TEGRETOL XR) 200 MG 12 hr tablet TAKE 3 TABLETS BY MOUTH TWICE A DAY 540 tablet 1  . carvedilol (COREG) 6.25 MG tablet  Take 1 tablet (6.25 mg total) by mouth 2 (two) times daily with a meal. 90 tablet 1  . DULoxetine (CYMBALTA) 60 MG capsule Take 60 mg by mouth daily.     Marland Kitchen estradiol (VIVELLE-DOT) 0.05 MG/24HR patch Place 1 patch onto the skin 2 (two) times a week. Sunday, Wednesday    . ethosuximide (ZARONTIN) 250 MG capsule TAKE 2 CAPSULE (500MG) IN THE MORNING, AND 2 CAPSULES (500MG) AT NIGHT 120 capsule 11  . ezetimibe (ZETIA) 10 MG tablet Take 1 tablet (10 mg total) by mouth daily. 90 tablet 1  . ibuprofen (ADVIL,MOTRIN) 600 MG tablet Take 1 tablet (600 mg total) by mouth every 8 (eight) hours as needed. (Patient taking differently: Take 600 mg by mouth every 8 (eight) hours as needed for headache or mild pain. ) 15 tablet 0  . isosorbide mononitrate (IMDUR) 30 MG 24 hr tablet Take 1 tablet (30 mg total) by mouth daily. ** DO NOT CRUSH ** 90 tablet 1  . levothyroxine (SYNTHROID, LEVOTHROID) 50 MCG tablet Take 50 mcg by mouth daily.     . nitroGLYCERIN (NITROSTAT) 0.4 MG SL tablet Place 1 tablet (0.4 mg total) under the tongue every 5 (five) minutes as needed. For chest pain. 25 tablet 6  . rosuvastatin (CRESTOR) 5 MG tablet Take 1 tablet by mouth up to 3 times weekly as directed 12 tablet 3   No current facility-administered medications for this visit.     PHYSICAL EXAMINATION: ECOG PERFORMANCE STATUS: 0 - Asymptomatic  Vitals:   01/14/19 1306  BP: 130/71  Pulse: 85  Resp: 18  Temp: 97.8 F (36.6 C)  SpO2: 96%   Filed Weights   01/14/19 1306  Weight: 180 lb 9.6 oz (81.9 kg)    GENERAL:alert, no distress and comfortable Musculoskeletal:no cyanosis of digits and no clubbing  NEURO: alert & oriented x 3 with fluent speech, no focal motor/sensory deficits  LABORATORY DATA:  I have reviewed the data as listed    Component Value Date/Time   NA 135 01/07/2019 1336   NA 138 08/14/2018 1451   NA 137 07/11/2016 0820   K 4.2 01/07/2019 1336   K 4.4 07/11/2016 0820   CL 100 01/07/2019 1336   CL  106 12/25/2012 0924   CO2 26 01/07/2019 1336   CO2 27 07/11/2016 0820   GLUCOSE 110 (H) 01/07/2019 1336   GLUCOSE 115 07/11/2016 0820   GLUCOSE 105 (H) 12/25/2012 0924   BUN 14 01/07/2019 1336   BUN 20 08/14/2018 1451   BUN 15.6 07/11/2016 0820   CREATININE 0.77 01/07/2019 1336   CREATININE 0.8 07/11/2016 0820   CALCIUM 8.6 (L) 01/07/2019 1336   CALCIUM 8.9 07/11/2016 0820   PROT 7.5 01/07/2019 1336   PROT 7.3 08/14/2018 1451   PROT 7.1 07/11/2016 0820   ALBUMIN 3.7 01/07/2019 1336   ALBUMIN 4.4 08/14/2018 1451   ALBUMIN 3.6 07/11/2016 0820   AST 12 (L) 01/07/2019 1336   AST 13 07/11/2016 0820   ALT 14 01/07/2019 1336   ALT 14 07/11/2016 0820   ALKPHOS 103 01/07/2019 1336   ALKPHOS 89 07/11/2016 0820   BILITOT <0.2 (L) 01/07/2019 1336   BILITOT <0.2 08/14/2018 1451   BILITOT 0.29  07/11/2016 0820   GFRNONAA >60 01/07/2019 1336   GFRAA >60 01/07/2019 1336    No results found for: SPEP, UPEP  Lab Results  Component Value Date   WBC 8.9 01/07/2019   NEUTROABS 5.8 01/07/2019   HGB 14.5 01/07/2019   HCT 43.4 01/07/2019   MCV 90.2 01/07/2019   PLT 296 01/07/2019      Chemistry      Component Value Date/Time   NA 135 01/07/2019 1336   NA 138 08/14/2018 1451   NA 137 07/11/2016 0820   K 4.2 01/07/2019 1336   K 4.4 07/11/2016 0820   CL 100 01/07/2019 1336   CL 106 12/25/2012 0924   CO2 26 01/07/2019 1336   CO2 27 07/11/2016 0820   BUN 14 01/07/2019 1336   BUN 20 08/14/2018 1451   BUN 15.6 07/11/2016 0820   CREATININE 0.77 01/07/2019 1336   CREATININE 0.8 07/11/2016 0820      Component Value Date/Time   CALCIUM 8.6 (L) 01/07/2019 1336   CALCIUM 8.9 07/11/2016 0820   ALKPHOS 103 01/07/2019 1336   ALKPHOS 89 07/11/2016 0820   AST 12 (L) 01/07/2019 1336   AST 13 07/11/2016 0820   ALT 14 01/07/2019 1336   ALT 14 07/11/2016 0820   BILITOT <0.2 (L) 01/07/2019 1336   BILITOT <0.2 08/14/2018 1451   BILITOT 0.29 07/11/2016 0820      All questions were  answered. The patient knows to call the clinic with any problems, questions or concerns. No barriers to learning was detected.  I spent 10 minutes counseling the patient face to face. The total time spent in the appointment was 15 minutes and more than 50% was on counseling and review of test results  Heath Lark, MD 01/14/2019 1:18 PM

## 2019-01-14 NOTE — Assessment & Plan Note (Signed)
CEA level is normal She is not symptomatic I will defer to her GI doctor for further follow-up

## 2019-01-15 ENCOUNTER — Telehealth: Payer: Self-pay | Admitting: Hematology and Oncology

## 2019-01-15 NOTE — Telephone Encounter (Signed)
I left a message regarding schedule  

## 2019-02-24 ENCOUNTER — Telehealth: Payer: Self-pay | Admitting: Neurology

## 2019-02-24 NOTE — Telephone Encounter (Signed)
Pt's husband called stating that he is concerned about the pt's memory deteriorating and also depression.

## 2019-02-24 NOTE — Telephone Encounter (Signed)
I reached out to the pt's husband and advised via vm we could see the pt in the office for worsening memory/depression. Pt would need to see Dr. Jannifer Franklin as this would be a new issue. Please schedule with Dr. Jannifer Franklin.

## 2019-03-10 ENCOUNTER — Other Ambulatory Visit: Payer: Self-pay | Admitting: Cardiology

## 2019-03-14 ENCOUNTER — Other Ambulatory Visit: Payer: Self-pay | Admitting: Neurology

## 2019-03-16 ENCOUNTER — Other Ambulatory Visit: Payer: Self-pay | Admitting: Cardiology

## 2019-04-03 DIAGNOSIS — Z23 Encounter for immunization: Secondary | ICD-10-CM | POA: Diagnosis not present

## 2019-05-06 DIAGNOSIS — E039 Hypothyroidism, unspecified: Secondary | ICD-10-CM | POA: Diagnosis not present

## 2019-05-06 DIAGNOSIS — E559 Vitamin D deficiency, unspecified: Secondary | ICD-10-CM | POA: Diagnosis not present

## 2019-05-06 DIAGNOSIS — M545 Low back pain: Secondary | ICD-10-CM | POA: Diagnosis not present

## 2019-05-06 DIAGNOSIS — N951 Menopausal and female climacteric states: Secondary | ICD-10-CM | POA: Diagnosis not present

## 2019-05-06 DIAGNOSIS — Z23 Encounter for immunization: Secondary | ICD-10-CM | POA: Diagnosis not present

## 2019-05-06 DIAGNOSIS — E785 Hyperlipidemia, unspecified: Secondary | ICD-10-CM | POA: Diagnosis not present

## 2019-05-06 DIAGNOSIS — M25552 Pain in left hip: Secondary | ICD-10-CM | POA: Diagnosis not present

## 2019-05-19 DIAGNOSIS — M545 Low back pain: Secondary | ICD-10-CM | POA: Diagnosis not present

## 2019-07-11 ENCOUNTER — Encounter (HOSPITAL_COMMUNITY): Payer: Self-pay | Admitting: *Deleted

## 2019-07-11 ENCOUNTER — Emergency Department (HOSPITAL_COMMUNITY): Payer: Medicare Other

## 2019-07-11 ENCOUNTER — Other Ambulatory Visit: Payer: Self-pay

## 2019-07-11 ENCOUNTER — Emergency Department (HOSPITAL_COMMUNITY)
Admission: EM | Admit: 2019-07-11 | Discharge: 2019-07-11 | Disposition: A | Discharge status: Home / Self Care | Payer: Medicare Other | Attending: Emergency Medicine | Admitting: Emergency Medicine

## 2019-07-11 DIAGNOSIS — R0789 Other chest pain: Secondary | ICD-10-CM | POA: Diagnosis not present

## 2019-07-11 DIAGNOSIS — Z79899 Other long term (current) drug therapy: Secondary | ICD-10-CM | POA: Diagnosis not present

## 2019-07-11 DIAGNOSIS — E039 Hypothyroidism, unspecified: Secondary | ICD-10-CM | POA: Diagnosis not present

## 2019-07-11 DIAGNOSIS — Z950 Presence of cardiac pacemaker: Secondary | ICD-10-CM | POA: Diagnosis not present

## 2019-07-11 DIAGNOSIS — R079 Chest pain, unspecified: Secondary | ICD-10-CM | POA: Diagnosis not present

## 2019-07-11 DIAGNOSIS — Z87891 Personal history of nicotine dependence: Secondary | ICD-10-CM | POA: Diagnosis not present

## 2019-07-11 DIAGNOSIS — I259 Chronic ischemic heart disease, unspecified: Secondary | ICD-10-CM | POA: Diagnosis not present

## 2019-07-11 DIAGNOSIS — Z955 Presence of coronary angioplasty implant and graft: Secondary | ICD-10-CM | POA: Insufficient documentation

## 2019-07-11 DIAGNOSIS — Z85038 Personal history of other malignant neoplasm of large intestine: Secondary | ICD-10-CM | POA: Diagnosis not present

## 2019-07-11 DIAGNOSIS — Z7982 Long term (current) use of aspirin: Secondary | ICD-10-CM | POA: Diagnosis not present

## 2019-07-11 DIAGNOSIS — R072 Precordial pain: Secondary | ICD-10-CM | POA: Diagnosis not present

## 2019-07-11 DIAGNOSIS — I10 Essential (primary) hypertension: Secondary | ICD-10-CM | POA: Insufficient documentation

## 2019-07-11 LAB — HEPATIC FUNCTION PANEL
ALT: 20 U/L (ref 0–44)
AST: 20 U/L (ref 15–41)
Albumin: 3.7 g/dL (ref 3.5–5.0)
Alkaline Phosphatase: 69 U/L (ref 38–126)
Bilirubin, Direct: 0.1 mg/dL (ref 0.0–0.2)
Indirect Bilirubin: 0.1 mg/dL — ABNORMAL LOW (ref 0.3–0.9)
Total Bilirubin: 0.2 mg/dL — ABNORMAL LOW (ref 0.3–1.2)
Total Protein: 7.5 g/dL (ref 6.5–8.1)

## 2019-07-11 LAB — BASIC METABOLIC PANEL
Anion gap: 6 (ref 5–15)
BUN: 14 mg/dL (ref 8–23)
CO2: 29 mmol/L (ref 22–32)
Calcium: 8.8 mg/dL — ABNORMAL LOW (ref 8.9–10.3)
Chloride: 101 mmol/L (ref 98–111)
Creatinine, Ser: 0.68 mg/dL (ref 0.44–1.00)
GFR calc Af Amer: >60 mL/min (ref >60)
GFR calc non Af Amer: >60 mL/min (ref >60)
Glucose, Bld: 116 mg/dL — ABNORMAL HIGH (ref 70–99)
Potassium: 4.1 mmol/L (ref 3.5–5.1)
Sodium: 136 mmol/L (ref 135–145)

## 2019-07-11 LAB — CBC
HCT: 43.4 % (ref 36.0–46.0)
Hemoglobin: 14.7 g/dL (ref 12.0–15.0)
MCH: 30.9 pg (ref 26.0–34.0)
MCHC: 33.9 g/dL (ref 30.0–36.0)
MCV: 91.4 fL (ref 80.0–100.0)
Platelets: 299 10*3/uL (ref 150–400)
RBC: 4.75 MIL/uL (ref 3.87–5.11)
RDW: 12.4 % (ref 11.5–15.5)
WBC: 6.1 10*3/uL (ref 4.0–10.5)
nRBC: 0 % (ref 0.0–0.2)

## 2019-07-11 LAB — TROPONIN I (HIGH SENSITIVITY)
Troponin I (High Sensitivity): 5 ng/L (ref <18)
Troponin I (High Sensitivity): 4 ng/L (ref <18)

## 2019-07-11 MED ORDER — SODIUM CHLORIDE 0.9% FLUSH: 3.0000 mL | Freq: Once | INTRAVENOUS | Status: DC

## 2019-07-11 MED ORDER — LIDOCAINE VISCOUS HCL 2 % MT SOLN
15.0000 mL | Freq: Once | OROMUCOSAL | Status: DC
  Filled 2019-07-11: qty 15

## 2019-07-11 MED ORDER — ALUM & MAG HYDROXIDE-SIMETH 200-200-20 MG/5ML PO SUSP
30.0000 mL | Freq: Once | ORAL | Status: DC
  Filled 2019-07-11: qty 30

## 2019-07-11 NOTE — ED Notes (Signed)
ED Provider at bedside. 

## 2019-07-11 NOTE — Discharge Instructions (Addendum)
You were seen in the emergency department for chest pain.  You had blood work EKG and a chest x-ray that did not show any serious findings.  Please contact your primary care doctor and cardiologist for close follow-up.  Return to the emergency department if any worsening symptoms.

## 2019-07-11 NOTE — ED Triage Notes (Signed)
To ED for eval of cp that started while pt was 'sipping coffee' this am. Describes pain as sharp and midsternal without radiation. No nausea. No vomiting. No diaphoresis. Pt appears in nad. Hx of bypass surgery/MI - states that pain 'was a lot worse'.

## 2019-07-11 NOTE — ED Provider Notes (Signed)
[facility] EMERGENCY DEPARTMENT Provider Note   CSN: [id number] Arrival date & time: 07/11/19  [id number]     History Chief Complaint  Patient presents with  . Chest Pain    Rachel Clark is a 69 y.o. female.  She has a history of cardiac disease.  She said she started with some subxiphoid and substernal chest pain a few hours ago.  She rates it as 4 out of 10.  No radiation.  No associated symptoms including shortness of breath diaphoresis nausea.   The history is provided by the patient.  Chest Pain Pain location:  Substernal area Pain quality: aching   Pain radiates to:  Does not radiate Pain severity now: 4/10. Onset quality:  Gradual Duration:  2 hours Timing:  Constant Progression:  Improving Chronicity:  New Context: at rest   Relieved by:  Nothing Worsened by:  Nothing Ineffective treatments:  None tried Associated symptoms: abdominal pain   Associated symptoms: no back pain, no cough, no diaphoresis, no dizziness, no fever, no lower extremity edema, no nausea, no shortness of breath and no vomiting   Risk factors: coronary artery disease        Past Medical History:  Diagnosis Date  . Arthritis    "fingers" (06/29/2016)  . Chronic lower back pain   . Colon cancer ([place]) 12/04/2011   s/p Laparoscopic-assisted transverse colectomy on 12/19/2011 by Dr. Donne Hazel.  pT3 N0 M0.   . Coronary artery disease    a. remote NSTEMI with PCI to the RCA; b. S/P CABG in 2007; c. 2010 - 2 v CAD with L-LAD and S-RCA patent, small caliber Dx 70% (treated medically - no amenable to PCI), CFX free of significant disease, normal LVF. d. 10/2014 Lexi MV: EF 61%, no ischemia/infarct.  . Depression   . Dyslipidemia   . Fibromyalgia   . Gastric ulcer   . GERD (gastroesophageal reflux disease)   . 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 Headache    "weekly" (06/29/2016)  . Hyperlipidemia   .  Hypothyroid   . Iron deficiency anemia 11/17/2011  . Memory change 11/13/2016  . Monoclonal gammopathy of unknown significance   . Myocardial infarction ([place]) 2007  . Presence of permanent cardiac pacemaker   . Syncope and collapse    pacemaker implanted  . Unstable angina [facility])     Patient Active Problem List   Diagnosis Date Noted  . Memory change 11/13/2016  . Acute bronchitis 03/23/2016  . Chest pain with high risk for cardiac etiology 11/05/2015  . Essential hypertension 08/25/2015  . Sick sinus syndrome ([place]) 08/16/2015  . Syncope and collapse 08/10/2015  . CAD, multiple vessel   . Chest pain   . Cardiac pacemaker in situ 07/25/2015  . Angina pectoris ([place]) 03/24/2015  . Unstable angina ([place]) 10/26/2014  . Seizures ([place]) 02/19/2014  . Lung nodule 02/06/2013  . Thyroid nodule 02/06/2013  . History of colon cancer 12/04/2011  . GERD (gastroesophageal reflux disease) 08/17/2011  . Monoclonal gammopathy of unknown significance   . Hypothyroid   . Coronary artery disease, post CABG 2007    . Dyslipidemia     Past Surgical History:  Procedure Laterality Date  . ANTERIOR CERVICAL DECOMP/DISCECTOMY FUSION  1980's  . BACK SURGERY    . CARDIAC CATHETERIZATION  05/20/2009   obstructive native vessel disease in LAD, RCA, and first diagonal, patent vein graft to distal RCA and  LIMA to LAD,normal. ef 60%  . CARDIAC CATHETERIZATION N/A 03/24/2015   Procedure: Left Heart Cath and Cors/Grafts Angiography;  Surgeon: Wellington Hampshire, MD;  Location: [place] CV LAB;  Service: Cardiovascular;  Laterality: N/A;  . CARDIAC CATHETERIZATION N/A 07/28/2015   Procedure: Left Heart Cath and Coronary Angiography;  Surgeon: Troy Sine, MD;  Location: [place] CV LAB;  Service: Cardiovascular;  Laterality: N/A;  . CARDIAC CATHETERIZATION N/A 11/09/2015   Procedure: Coronary Stent Intervention;  Surgeon: Sherren Mocha, MD;  Location: [place] CV LAB;  Service: Cardiovascular;   Laterality: N/A;  . CARDIAC CATHETERIZATION N/A 11/09/2015   Procedure: Left Heart Cath and Coronary Angiography;  Surgeon: Sherren Mocha, MD;  Location: [place] CV LAB;  Service: Cardiovascular;  Laterality: N/A;  . CARDIAC CATHETERIZATION N/A 06/29/2016   Procedure: Left Heart Cath and Coronary Angiography;  Surgeon: Nelva Bush, MD;  Location: [place] CV LAB;  Service: Cardiovascular;  Laterality: N/A;  . CARDIAC CATHETERIZATION N/A 06/29/2016   Procedure: Intravascular Pressure Wire/FFR Study;  Surgeon: Nelva Bush, MD;  Location: [place] CV LAB;  Service: Cardiovascular;  Laterality: N/A;  . CARDIAC CATHETERIZATION N/A 06/29/2016   Procedure: Coronary Balloon Angioplasty;  Surgeon: Nelva Bush, MD;  Location: [place] CV LAB;  Service: Cardiovascular;  Laterality: N/A;  . COLON RESECTION  12/19/2011   Procedure: COLON RESECTION LAPAROSCOPIC;  Surgeon: Rolm Bookbinder, MD;  Location: [place];  Service: General;  Laterality: N/A;  laparoscopic hand assisted partial colon resection  . COLON SURGERY    . COLONOSCOPY    . CORONARY ANGIOPLASTY WITH STENT PLACEMENT  ~ 2007   1 stent  . CORONARY ARTERY BYPASS GRAFT  2007   "CABG X2"  . DILATION AND CURETTAGE OF UTERUS  1973  . EP IMPLANTABLE DEVICE N/A 03/25/2015   MDT Advisa DR pacemaker implanted by Dr Rayann Heman for transient complete heart block and syncope  . INSERT / REPLACE / REMOVE PACEMAKER    . LEFT HEART CATH AND CORS/GRAFTS ANGIOGRAPHY N/A 02/21/2018   Procedure: LEFT HEART CATH AND CORS/GRAFTS ANGIOGRAPHY;  Surgeon: Lorretta Harp, MD;  Location: [place] CV LAB;  Service: Cardiovascular;  Laterality: N/A;  . PORT-A-CATH REMOVAL N/A 06/12/2013   Procedure: REMOVAL PORT-A-CATH;  Surgeon: Rolm Bookbinder, MD;  Location: [place];  Service: General;  Laterality: N/A;  . PORTACATH PLACEMENT  06/04/2012   Procedure: INSERTION PORT-A-CATH;  Surgeon: Rolm Bookbinder, MD;  Location: [place];   Service: General;  Laterality: N/A;  Insertion of port-a-cath   . POSTERIOR LAMINECTOMY / DECOMPRESSION CERVICAL SPINE  1990s  . VAGINAL HYSTERECTOMY       OB History   No obstetric history on file.     Family History  Problem Relation Age of Onset  . Heart attack Father   . Heart disease Father   . Heart attack Brother 17  . Cancer Mother        lung  . Stroke Neg Hx     Social History   Tobacco Use  . Smoking status: Former Smoker    Packs/day: 0.30    Years: 20.00    Pack years: 6.00    Types: Cigarettes    Quit date: 06/06/1981    Years since quitting: 38.1  . Smokeless tobacco: Never Used  Substance Use Topics  . Alcohol use: Yes    Alcohol/week: 1.0 standard drinks    Types: 1 Glasses of wine per week  . Drug use: No  Home Medications Prior to Admission medications   Medication Sig Start Date End Date Taking? Authorizing Provider  acetaminophen (TYLENOL) 500 MG tablet Take 500-1,000 mg by mouth daily as needed for mild pain or headache.    [provider]  amLODipine (NORVASC) 10 MG tablet Take 1 tablet (10 mg total) by mouth daily. 04/11/18   [name], PA  aspirin 81 MG chewable tablet Chew 1 tablet (81 mg total) by mouth daily. 06/30/16   Arbutus Leas, NP  Azelastine HCl 0.15 % SOLN Place 1 spray into both nostrils daily as needed (seasonal allergies).  12/22/14   [provider]  carbamazepine (TEGRETOL XR) 200 MG 12 hr tablet TAKE 3 TABLETS BY MOUTH TWICE A DAY 03/17/19   Kathrynn Ducking, MD  carvedilol (COREG) 6.25 MG tablet Take 1 tablet (6.25 mg total) by mouth 2 (two) times daily with a meal. 04/11/18   Duke, Tami Lin, PA  DULoxetine (CYMBALTA) 60 MG capsule Take 60 mg by mouth daily.     [provider]  estradiol (VIVELLE-DOT) 0.05 MG/24HR patch Place 1 patch onto the skin 2 (two) times a week. Sunday, Wednesday    [provider]  ethosuximide (ZARONTIN) 250 MG capsule TAKE 2 CAPSULE (500MG ) IN THE  MORNING, AND 2 CAPSULES (500MG ) AT NIGHT 09/02/18   Suzzanne Cloud, NP  ezetimibe (ZETIA) 10 MG tablet Take 1 tablet (10 mg total) by mouth daily. OV NEEDED 03/11/19   [name], Peter M, MD  ibuprofen (ADVIL,MOTRIN) 600 MG tablet Take 1 tablet (600 mg total) by mouth every 8 (eight) hours as needed. Patient taking differently: Take 600 mg by mouth every 8 (eight) hours as needed for headache or mild pain.  04/07/17   Jola Schmidt, MD  isosorbide mononitrate (IMDUR) 30 MG 24 hr tablet TAKE 1 TABLET (30 MG TOTAL) BY MOUTH DAILY. ** DO NOT CRUSH ** 03/17/19   [name], Peter M, MD  levothyroxine (SYNTHROID, LEVOTHROID) 50 MCG tablet Take 50 mcg by mouth daily.     [provider]  nitroGLYCERIN (NITROSTAT) 0.4 MG SL tablet Place 1 tablet (0.4 mg total) under the tongue every 5 (five) minutes as needed. For chest pain. 02/22/18   Leanor Kail, PA  rosuvastatin (CRESTOR) 5 MG tablet Take 1 tablet by mouth up to 3 times weekly as directed 04/11/18   [name], PA    Allergies    Aspirin, Erythromycin, Lisinopril, Niacin and related, Statins, Sulfa drugs cross reactors, Tetracyclines & related, Penicillins, and Plavix [clopidogrel bisulfate]  Review of Systems   Review of Systems  Constitutional: Negative for diaphoresis and fever.  HENT: Negative for sore throat.   Eyes: Negative for visual disturbance.  Respiratory: Negative for cough and shortness of breath.   Cardiovascular: Positive for chest pain.  Gastrointestinal: Positive for abdominal pain. Negative for nausea and vomiting.  Genitourinary: Negative for dysuria.  Musculoskeletal: Negative for back pain.  Skin: Negative for rash.  Neurological: Negative for dizziness.    Physical Exam Updated Vital Signs BP 124/80 (BP Location: Left Arm)   Pulse 90   Temp 98 F (36.7 C) (Oral)   Resp 14   SpO2 100%   Physical Exam Vitals and nursing note reviewed.  Constitutional:      General: She is not in acute distress.     Appearance: She is well-developed.  HENT:     Head: Normocephalic and atraumatic.  Eyes:     Conjunctiva/sclera: Conjunctivae normal.  Cardiovascular:  Rate and Rhythm: Normal rate and regular rhythm.     Heart sounds: No murmur.  Pulmonary:     Effort: Pulmonary effort is normal. No respiratory distress.     Breath sounds: Normal breath sounds.  Abdominal:     Palpations: Abdomen is soft.     Tenderness: There is no abdominal tenderness.  Musculoskeletal:        General: Normal range of motion.     Cervical back: Neck supple.     Right lower leg: No tenderness. No edema.     Left lower leg: No tenderness. No edema.  Skin:    General: Skin is warm and dry.     Capillary Refill: Capillary refill takes less than 2 seconds.  Neurological:     General: No focal deficit present.     Mental Status: She is alert.     ED Results / Procedures / Treatments   Labs (all labs ordered are listed, but only abnormal results are displayed) Labs Reviewed  BASIC METABOLIC PANEL - Abnormal; Notable for the following components:      Result Value   Glucose, Bld 116 (*)    Calcium 8.8 (*)    All other components within normal limits  HEPATIC FUNCTION PANEL - Abnormal; Notable for the following components:   Total Bilirubin 0.2 (*)    Indirect Bilirubin 0.1 (*)    All other components within normal limits  CBC  TROPONIN I (HIGH SENSITIVITY)  TROPONIN I (HIGH SENSITIVITY)    EKG EKG Interpretation  Date/Time:  Friday July 11 2019 09:52:11 EST Ventricular Rate:  88 PR Interval:  190 QRS Duration: 84 QT Interval:  342 QTC Calculation: 413 R Axis:   58 Text Interpretation: Sinus rhythm with Premature atrial complexes Nonspecific T wave abnormality Abnormal ECG similar to prior 8/19 Confirmed by Aletta Edouard [phone]) on 07/11/2019 10:24:14 AM   Radiology DG Chest 2 View  Result Date: 07/11/2019 CLINICAL DATA:  cp this am. Describes pain as sharp and midsternal without  radiation. No nausea. No vomiting. No diaphoresis. EXAM: CHEST - 2 VIEW COMPARISON:  02/23/2018 FINDINGS: Lungs are clear. Previous CABG. Heart size normal. Aortic Atherosclerosis (ICD10-170.0). Left subclavian dual lead transvenous pacemaker stable. No effusion. No pneumothorax. Sternotomy wires. IMPRESSION: No acute disease, post CABG Electronically Signed   By: Lucrezia Europe M.D.   On: 07/11/2019 10:20    Procedures Procedures (including critical care time)  Medications Ordered in ED Medications  sodium chloride flush (NS) 0.9 % injection 3 mL (3 mLs Intravenous Not Given 07/11/19 1052)  alum & mag hydroxide-simeth (MAALOX/MYLANTA) 200-200-20 MG/5ML suspension 30 mL (0 mLs Oral Hold 07/11/19 1155)    And  lidocaine (XYLOCAINE) 2 % viscous mouth solution 15 mL (0 mLs Oral Hold 07/11/19 1155)    ED Course  I have reviewed the triage vital signs and the nursing notes.  Pertinent labs & imaging results that were available during my care of the patient were reviewed by me and considered in my medical decision making (see chart for details).  Clinical Course as of Jul 10 1725  Fri Jul 11, 2019  1038 Chest x-ray interpreted by me as pacemaker in place, no gross infiltrates or pneumothorax.   [MB]  6039 69 year old female with history of coronary disease complaining of some substernal and subxiphoid aching pain.  Differential includes ACS, pneumonia, pneumothorax, GERD, cholelithiasis.   [MB]    Clinical Course User Index [MB] Hayden Rasmussen, MD   MDM Rules/Calculators/A&P  Delta troponin negative and pain resolved. Patient comfortable with plan for discharge and followup with her pcp and cardiologist.  Final Clinical Impression(s) / ED Diagnoses Final diagnoses:  Nonspecific chest pain    Rx / DC Orders ED Discharge Orders    None       Hayden Rasmussen, MD 07/11/19 1728

## 2019-07-11 NOTE — ED Notes (Signed)
Call spouse Daniylah Mikkola at [phone] for an update

## 2019-08-15 ENCOUNTER — Telehealth: Payer: Self-pay | Admitting: Neurology

## 2019-08-15 NOTE — Telephone Encounter (Signed)
Pt's husband Jeneen Rinks called wanting to alert the provider that the pt's memory has been getting worse since the last appt and would also like to report fear of depression for the pt. He does not want a call back because the pt can not know that he is reporting this to the provider or she will get very agitated. He states that he would like to be present in the pt's next appt preferably in person but if not because of COVID then he would like to be on speaker phone to listen in on the appt.

## 2019-08-18 ENCOUNTER — Encounter: Payer: Self-pay | Admitting: Neurology

## 2019-08-18 ENCOUNTER — Other Ambulatory Visit: Payer: Self-pay

## 2019-08-18 ENCOUNTER — Ambulatory Visit (INDEPENDENT_AMBULATORY_CARE_PROVIDER_SITE_OTHER): Payer: Medicare Other | Admitting: Neurology

## 2019-08-18 VITALS — BP 146/82 | HR 82 | Temp 97.0°F | Ht 69.0 in | Wt 180.8 lb

## 2019-08-18 DIAGNOSIS — R413 Other amnesia: Secondary | ICD-10-CM

## 2019-08-18 DIAGNOSIS — R569 Unspecified convulsions: Secondary | ICD-10-CM

## 2019-08-18 MED ORDER — ETHOSUXIMIDE 250 MG PO CAPS: ORAL_CAPSULE | ORAL | 11 refills | Status: DC

## 2019-08-18 MED ORDER — MEMANTINE HCL 10 MG PO TABS: 10.0000 mg | ORAL_TABLET | Freq: Two times a day (BID) | ORAL | 1 refills | Status: DC

## 2019-08-18 NOTE — Progress Notes (Signed)
I have read the note, and I agree with the clinical assessment and plan.  Pandora Mccrackin K Deshanae Lindo   

## 2019-08-18 NOTE — Progress Notes (Signed)
PATIENT: Rachel Clark DOB: 1950/08/20  REASON FOR VISIT: follow up HISTORY FROM: patient  HISTORY OF PRESENT ILLNESS: Today 08/18/19  Mr. Heathcote is 69 year old female with history of seizures and memory disturbance.  She remains on Tegretol XR and Zarotin.  She has not had recurrent seizure, her last seizure occurred when she was 11.  She has been off Namenda for at least a year.  Her husband reports a decline in her short-term memory.  She does all of her own ADLs, cooking, housework, drives, runs errands well.  She manages her medications without problem.  She sleeps well at night and has a good appetite.  She does feel that she has been more depressed, as she is isolated with Covid, and has not been going out.  Fortunately, she has received her first vaccine.  She does have history of cardiac stent with pacemaker, she was in the ER in January for chest pain, was discharged home.  She presents today for evaluation accompanied by her husband.  HISTORY 08/14/2018 SS: [name] is a 69 year old female with history of seizures and memory disturbance. MMSE 26/30 in April 2019.  She is taking Tegretol XR 3 tablets by mouth twice a day and Zarontin 250 mg 2 capsules in the morning and 2 capsules at night.  She is tolerating these medications well and she reports her last seizure was when she was age 28.  She operates a Teacher, music without difficulty.  She currently works part-time and enjoys her work.  She lives with her husband and reports she is able to complete all of her ADLs.  She denies any trouble with her memory.  She reports that she took herself off the [place].  She reports that she took herself off because she did not notice a change in her memory and that it was $50 a month.  She reports that nobody at her work notices her memory and that she has no problems meeting her deadlines at work.  She reports that she sleeps well.  She has a good appetite.  She denies any problems or  concerns today.  She presents for follow-up today unaccompanied.   REVIEW OF SYSTEMS: Out of a complete 14 system review of symptoms, the patient complains only of the following symptoms, and all other reviewed systems are negative.  Memory loss, seizures   ALLERGIES: Allergies  Allergen Reactions  . Aspirin Other (See Comments)    GI upset- can tolerate 81 mg ASA, just not full doses  . Erythromycin Nausea Only    Gi upset  . Lisinopril Cough  . Niacin And Related Other (See Comments)    Pt does not recall reactions   . Statins Other (See Comments)    Muscle pain  . Sulfa Drugs Cross Reactors Swelling  . Tetracyclines & Related Nausea Only  . Penicillins Nausea Only and Rash    Has patient had a PCN reaction causing immediate rash, facial/tongue/throat swelling, SOB or lightheadedness with hypotension: Yes Has patient had a PCN reaction causing severe rash involving mucus membranes or skin necrosis: No Has patient had a PCN reaction that required hospitalization No Has patient had a PCN reaction occurring within the last 10 years: No If all of the above answers are "NO", then may proceed with Cephalosporin use.   Marland Kitchen Plavix [Clopidogrel Bisulfate] Rash    HOME MEDICATIONS: Outpatient Medications Prior to Visit  Medication Sig Dispense Refill  . acetaminophen (TYLENOL) 500 MG tablet Take 500-1,000 mg  by mouth daily as needed for mild pain or headache.    Marland Kitchen amLODipine (NORVASC) 10 MG tablet Take 1 tablet (10 mg total) by mouth daily. 90 tablet 1  . aspirin 81 MG chewable tablet Chew 1 tablet (81 mg total) by mouth daily.    . Azelastine HCl 0.15 % SOLN Place 1 spray into both nostrils daily as needed (seasonal allergies).   12  . carbamazepine (TEGRETOL XR) 200 MG 12 hr tablet TAKE 3 TABLETS BY MOUTH TWICE A DAY 180 tablet 5  . carvedilol (COREG) 6.25 MG tablet Take 1 tablet (6.25 mg total) by mouth 2 (two) times daily with a meal. 90 tablet 1  . DULoxetine (CYMBALTA) 60 MG  capsule Take 60 mg by mouth daily.     Marland Kitchen estradiol (VIVELLE-DOT) 0.05 MG/24HR patch Place 1 patch onto the skin 2 (two) times a week. Sunday, Wednesday    . ethosuximide (ZARONTIN) 250 MG capsule TAKE 2 CAPSULE (500MG ) IN THE MORNING, AND 2 CAPSULES (500MG ) AT NIGHT 120 capsule 11  . ezetimibe (ZETIA) 10 MG tablet Take 1 tablet (10 mg total) by mouth daily. OV NEEDED 90 tablet 0  . ibuprofen (ADVIL,MOTRIN) 600 MG tablet Take 1 tablet (600 mg total) by mouth every 8 (eight) hours as needed. (Patient taking differently: Take 600 mg by mouth every 8 (eight) hours as needed for headache or mild pain. ) 15 tablet 0  . isosorbide mononitrate (IMDUR) 30 MG 24 hr tablet TAKE 1 TABLET (30 MG TOTAL) BY MOUTH DAILY. ** DO NOT CRUSH ** 90 tablet 1  . levothyroxine (SYNTHROID, LEVOTHROID) 50 MCG tablet Take 50 mcg by mouth daily.     . nitroGLYCERIN (NITROSTAT) 0.4 MG SL tablet Place 1 tablet (0.4 mg total) under the tongue every 5 (five) minutes as needed. For chest pain. 25 tablet 6  . rosuvastatin (CRESTOR) 5 MG tablet Take 1 tablet by mouth up to 3 times weekly as directed 12 tablet 3   No facility-administered medications prior to visit.    PAST MEDICAL HISTORY: Past Medical History:  Diagnosis Date  . Arthritis    "fingers" (06/29/2016)  . Chronic lower back pain   . Colon cancer ([place]) 12/04/2011   s/p Laparoscopic-assisted transverse colectomy on 12/19/2011 by Dr. Donne Hazel.  pT3 N0 M0.   . Coronary artery disease    a. remote NSTEMI with PCI to the RCA; b. S/P CABG in 2007; c. 2010 - 2 v CAD with L-LAD and S-RCA patent, small caliber Dx 70% (treated medically - no amenable to PCI), CFX free of significant disease, normal LVF. d. 10/2014 Lexi MV: EF 61%, no ischemia/infarct.  . Depression   . Dyslipidemia   . Fibromyalgia   . Gastric ulcer   . GERD (gastroesophageal reflux disease)   . 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  Headache    "weekly" (06/29/2016)  . Hyperlipidemia   . Hypothyroid   . Iron deficiency anemia 11/17/2011  . Memory change 11/13/2016  . Monoclonal gammopathy of unknown significance   . Myocardial infarction ([place]) 2007  . Presence of permanent cardiac pacemaker   . Syncope and collapse    pacemaker implanted  . Unstable angina ([place])     PAST SURGICAL HISTORY: Past Surgical History:  Procedure Laterality Date  . ANTERIOR CERVICAL DECOMP/DISCECTOMY FUSION  1980's  . BACK SURGERY    . CARDIAC CATHETERIZATION  05/20/2009   obstructive native vessel  disease in LAD, RCA, and first diagonal, patent vein graft to distal RCA and LIMA to LAD,normal. ef 60%  . CARDIAC CATHETERIZATION N/A 03/24/2015   Procedure: Left Heart Cath and Cors/Grafts Angiography;  Surgeon: Wellington Hampshire, MD;  Location: [facility];  Service: Cardiovascular;  Laterality: N/A;  . CARDIAC CATHETERIZATION N/A 07/28/2015   Procedure: Left Heart Cath and Coronary Angiography;  Surgeon: Troy Sine, MD;  Location: [facility];  Service: Cardiovascular;  Laterality: N/A;  . CARDIAC CATHETERIZATION N/A 11/09/2015   Procedure: Coronary Stent Intervention;  Surgeon: Sherren Mocha, MD;  Location: [facility];  Service: Cardiovascular;  Laterality: N/A;  . CARDIAC CATHETERIZATION N/A 11/09/2015   Procedure: Left Heart Cath and Coronary Angiography;  Surgeon: Sherren Mocha, MD;  Location: [facility];  Service: Cardiovascular;  Laterality: N/A;  . CARDIAC CATHETERIZATION N/A 06/29/2016   Procedure: Left Heart Cath and Coronary Angiography;  Surgeon: Nelva Bush, MD;  Location: [facility];  Service: Cardiovascular;  Laterality: N/A;  . CARDIAC CATHETERIZATION N/A 06/29/2016   Procedure: Intravascular Pressure Wire/FFR Study;  Surgeon: Nelva Bush, MD;  Location: [facility];  Service: Cardiovascular;  Laterality: N/A;  . CARDIAC CATHETERIZATION N/A 06/29/2016   Procedure: Coronary  Balloon Angioplasty;  Surgeon: Nelva Bush, MD;  Location: [facility];  Service: Cardiovascular;  Laterality: N/A;  . COLON RESECTION  12/19/2011   Procedure: COLON RESECTION LAPAROSCOPIC;  Surgeon: Rolm Bookbinder, MD;  Location: [facility];  Service: General;  Laterality: N/A;  laparoscopic hand assisted partial colon resection  . COLON SURGERY    . COLONOSCOPY    . CORONARY ANGIOPLASTY WITH STENT PLACEMENT  ~ 2007   1 stent  . CORONARY ARTERY BYPASS GRAFT  2007   "CABG X2"  . DILATION AND CURETTAGE OF UTERUS  1973  . EP IMPLANTABLE DEVICE N/A 03/25/2015   MDT Advisa DR pacemaker implanted by Dr Rayann Heman for transient complete heart block and syncope  . INSERT / REPLACE / REMOVE PACEMAKER    . LEFT HEART CATH AND CORS/GRAFTS ANGIOGRAPHY N/A 02/21/2018   Procedure: LEFT HEART CATH AND CORS/GRAFTS ANGIOGRAPHY;  Surgeon: Lorretta Harp, MD;  Location: [facility];  Service: Cardiovascular;  Laterality: N/A;  . PORT-A-CATH REMOVAL N/A 06/12/2013   Procedure: REMOVAL PORT-A-CATH;  Surgeon: Rolm Bookbinder, MD;  Location: [facility];  Service: General;  Laterality: N/A;  . PORTACATH PLACEMENT  06/04/2012   Procedure: INSERTION PORT-A-CATH;  Surgeon: Rolm Bookbinder, MD;  Location: [facility];  Service: General;  Laterality: N/A;  Insertion of port-a-cath   . POSTERIOR LAMINECTOMY / DECOMPRESSION CERVICAL SPINE  1990s  . VAGINAL HYSTERECTOMY      FAMILY HISTORY: Family History  Problem Relation Age of Onset  . Heart attack Father   . Heart disease Father   . Heart attack Brother 66  . Cancer Mother        lung  . Stroke Neg Hx     SOCIAL HISTORY: Social History   Socioeconomic History  . Marital status: Married    Spouse name: Not on file  . Number of children: 2  . Years of education: 76  . Highest education level: Not on file  Occupational History  . Occupation: office work    Fish farm manager: [employer]    Comment: unemployed  Tobacco Use    . Smoking status: Former Smoker    Packs/day: 0.30    Years: 20.00  Pack years: 6.00    Types: Cigarettes    Quit date: 06/06/1981    Years since quitting: 38.2  . Smokeless tobacco: Never Used  Substance and Sexual Activity  . Alcohol use: Yes    Alcohol/week: 1.0 standard drinks    Types: 1 Glasses of wine per week  . Drug use: No  . Sexual activity: Not Currently    Birth control/protection: Surgical  Other Topics Concern  . Not on file  Social History Narrative   Patient is married with 2 children.   Patient is right handed.   Patient has a high school education with some college education.   Patient drinks 2 cups daily.   Social Determinants of Health   Financial Resource Strain:   . Difficulty of Paying Living Expenses: Not on file  Food Insecurity:   . Worried About Charity fundraiser in the Last Year: Not on file  . Ran Out of Food in the Last Year: Not on file  Transportation Needs:   . Lack of Transportation (Medical): Not on file  . Lack of Transportation (Non-Medical): Not on file  Physical Activity:   . Days of Exercise per Week: Not on file  . Minutes of Exercise per Session: Not on file  Stress:   . Feeling of Stress : Not on file  Social Connections:   . Frequency of Communication with Friends and Family: Not on file  . Frequency of Social Gatherings with Friends and Family: Not on file  . Attends Religious Services: Not on file  . Active Member of Clubs or Organizations: Not on file  . Attends Archivist Meetings: Not on file  . Marital Status: Not on file  Intimate Partner Violence:   . Fear of Current or Ex-Partner: Not on file  . Emotionally Abused: Not on file  . Physically Abused: Not on file  . Sexually Abused: Not on file   PHYSICAL EXAM  Vitals:   08/18/19 1348  BP: (!) 146/82  Pulse: 82  Temp: (!) 97 F (36.1 C)  TempSrc: Oral  Weight: 180 lb 12.8 oz (82 kg)  Height: 5\' 9"  (1.753 m)   Body mass index is 26.7  kg/m.  Generalized: Well developed, in no acute distress  MMSE - Mini Mental State Exam 08/18/2019 08/14/2018 10/29/2017  Not completed: (No Data) - -  Orientation to time 2 4 3   Orientation to Place 3 5 5   Registration 3 3 3   Attention/ Calculation 5 5 4   Recall 1 0 2  Language- name 2 objects 2 2 2   Language- repeat 1 1 1   Language- follow 3 step command 3 3 3   Language- read & follow direction 1 1 1   Write a sentence 1 1 1   Copy design 1 1 1   Total score 23 26 26     Neurological examination  Mentation: Alert oriented to time, place, history taking. Follows all commands speech and language fluent Cranial nerve II-XII: Pupils were equal round reactive to light. Extraocular movements were full, visual field were full on confrontational test. Facial sensation and strength were normal. Head turning and shoulder shrug  were normal and symmetric. Motor: The motor testing reveals 5 over 5 strength of all 4 extremities. Good symmetric motor tone is noted throughout.  Sensory: Sensory testing is intact to soft touch on all 4 extremities. No evidence of extinction is noted.  Coordination: Cerebellar testing reveals good finger-nose-finger and heel-to-shin bilaterally.  Gait and station: Gait is normal.  Tandem gait is normal. Romberg is negative. No drift is seen.  Reflexes: Deep tendon reflexes are symmetric and normal bilaterally.   DIAGNOSTIC DATA (LABS, IMAGING, TESTING) - I reviewed patient records, labs, notes, testing and imaging myself where available.  Lab Results  Component Value Date   WBC 6.1 07/11/2019   HGB 14.7 07/11/2019   HCT 43.4 07/11/2019   MCV 91.4 07/11/2019   PLT 299 07/11/2019      Component Value Date/Time   NA 136 07/11/2019 0956   NA 138 08/14/2018 1451   NA 137 07/11/2016 0820   K 4.1 07/11/2019 0956   K 4.4 07/11/2016 0820   CL 101 07/11/2019 0956   CL 106 12/25/2012 0924   CO2 29 07/11/2019 0956   CO2 27 07/11/2016 0820   GLUCOSE 116 (H) 07/11/2019  0956   GLUCOSE 115 07/11/2016 0820   GLUCOSE 105 (H) 12/25/2012 0924   BUN 14 07/11/2019 0956   BUN 20 08/14/2018 1451   BUN 15.6 07/11/2016 0820   CREATININE 0.68 07/11/2019 0956   CREATININE 0.8 07/11/2016 0820   CALCIUM 8.8 (L) 07/11/2019 0956   CALCIUM 8.9 07/11/2016 0820   PROT 7.5 07/11/2019 1056   PROT 7.3 08/14/2018 1451   PROT 7.1 07/11/2016 0820   ALBUMIN 3.7 07/11/2019 1056   ALBUMIN 4.4 08/14/2018 1451   ALBUMIN 3.6 07/11/2016 0820   AST 20 07/11/2019 1056   AST 13 07/11/2016 0820   ALT 20 07/11/2019 1056   ALT 14 07/11/2016 0820   ALKPHOS 69 07/11/2019 1056   ALKPHOS 89 07/11/2016 0820   BILITOT 0.2 (L) 07/11/2019 1056   BILITOT <0.2 08/14/2018 1451   BILITOT 0.29 07/11/2016 0820   GFRNONAA >60 07/11/2019 0956   GFRAA >60 07/11/2019 0956   Lab Results  Component Value Date   CHOL 229 (H) 02/21/2018   HDL 72 02/21/2018   LDLCALC 138 (H) 02/21/2018   TRIG 97 02/21/2018   CHOLHDL 3.2 02/21/2018   Lab Results  Component Value Date   HGBA1C 5.5 02/20/2018   Lab Results  Component Value Date   VITAMINB12 290 11/13/2016   Lab Results  Component Value Date   TSH 1.206 02/20/2018      ASSESSMENT AND PLAN 69 y.o. 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 here with:  1.  Memory disturbance 2.  Seizures  I will get her started back on Namenda 10 mg twice a day for the memory.  She has not had recurrent seizure.  I will check lab work today.  She will  remain on Tegretol XR and Zarontin.  Hopefully, once she receives her second vaccine, she will not feel so socially isolated.  She remains on Cymbalta from her primary doctor for depression.  If the depression does not improve, she will discuss with her primary doctor for dose increase.  She will call for recurrent seizure, otherwise follow-up in 1 year or sooner if needed.   I spent 25 minutes with the patient. 50% of this time was spent discussing her plan of care.   Butler Denmark, AGNP-C, DNP 08/18/2019, 2:20 PM [facility] [address], [address]  [place], [place] [address] [phone]

## 2019-08-18 NOTE — Telephone Encounter (Signed)
The husband does not want a call back.

## 2019-08-18 NOTE — Patient Instructions (Addendum)
Continue seizure medications, will check blood work today. Restart the Namenda 10 mg twice daily for memory. Discuss your concerns of depression with your PCP if does not improve.

## 2019-08-19 LAB — COMPREHENSIVE METABOLIC PANEL
ALT: 13 IU/L (ref 0–32)
AST: 15 IU/L (ref 0–40)
Albumin/Globulin Ratio: 1.4 (ref 1.2–2.2)
Albumin: 4.2 g/dL (ref 3.8–4.8)
Alkaline Phosphatase: 105 IU/L (ref 39–117)
BUN/Creatinine Ratio: 22 (ref 12–28)
BUN: 15 mg/dL (ref 8–27)
Bilirubin Total: <0.2 mg/dL (ref 0.0–1.2)
CO2: 24 mmol/L (ref 20–29)
Calcium: 9.2 mg/dL (ref 8.7–10.3)
Chloride: 101 mmol/L (ref 96–106)
Creatinine, Ser: 0.69 mg/dL (ref 0.57–1.00)
GFR calc Af Amer: 102 mL/min/{1.73_m2} (ref >59)
GFR calc non Af Amer: 88 mL/min/{1.73_m2} (ref >59)
Globulin, Total: 2.9 g/dL (ref 1.5–4.5)
Glucose: 113 mg/dL — ABNORMAL HIGH (ref 65–99)
Potassium: 4.6 mmol/L (ref 3.5–5.2)
Sodium: 139 mmol/L (ref 134–144)
Total Protein: 7.1 g/dL (ref 6.0–8.5)

## 2019-08-19 LAB — CARBAMAZEPINE LEVEL, TOTAL: Carbamazepine (Tegretol), S: 8.5 ug/mL (ref 4.0–12.0)

## 2019-08-19 LAB — ETHOSUXIMIDE LEVEL: Ethosuximide Lvl: 64 ug/mL (ref 40–100)

## 2019-08-20 ENCOUNTER — Telehealth: Payer: Self-pay

## 2019-08-20 NOTE — Telephone Encounter (Signed)
Called to relay pts recent lab results per NP Grandville Silos request. I was able to speak to the pt directly. Pt demonstrated understanding and had no questions nor concerns.   "Please call the patient. CMP was stable, mildly elevated glucose 113, carbamazepine and ethosuximide level were therapeutic. CBC from January was normal. "- NP SS

## 2019-09-03 DIAGNOSIS — I25119 Atherosclerotic heart disease of native coronary artery with unspecified angina pectoris: Secondary | ICD-10-CM | POA: Diagnosis not present

## 2019-09-03 DIAGNOSIS — M797 Fibromyalgia: Secondary | ICD-10-CM | POA: Diagnosis not present

## 2019-09-03 DIAGNOSIS — G40909 Epilepsy, unspecified, not intractable, without status epilepticus: Secondary | ICD-10-CM | POA: Diagnosis not present

## 2019-09-03 DIAGNOSIS — E039 Hypothyroidism, unspecified: Secondary | ICD-10-CM | POA: Diagnosis not present

## 2019-09-03 DIAGNOSIS — E785 Hyperlipidemia, unspecified: Secondary | ICD-10-CM | POA: Diagnosis not present

## 2019-09-03 DIAGNOSIS — E559 Vitamin D deficiency, unspecified: Secondary | ICD-10-CM | POA: Diagnosis not present

## 2019-09-03 DIAGNOSIS — I2510 Atherosclerotic heart disease of native coronary artery without angina pectoris: Secondary | ICD-10-CM | POA: Diagnosis not present

## 2019-09-03 DIAGNOSIS — F324 Major depressive disorder, single episode, in partial remission: Secondary | ICD-10-CM | POA: Diagnosis not present

## 2019-09-24 ENCOUNTER — Telehealth: Payer: Self-pay

## 2019-09-24 NOTE — Telephone Encounter (Signed)
Called patient left message on personal voice mail to call me back.Dr.Jordan wants you to see pharmacist in [facility].Also I need to schedule you a follow up appointment with Dr.Jordan.

## 2019-09-25 ENCOUNTER — Telehealth: Payer: Self-pay

## 2019-09-25 ENCOUNTER — Telehealth: Payer: Self-pay | Admitting: Cardiology

## 2019-09-25 NOTE — Telephone Encounter (Signed)
LMOVM for pt to call back. I need to follow up to see if she received her new monitor

## 2019-09-25 NOTE — Telephone Encounter (Signed)
-----   Message from Rachel Clark, [place] sent at 09/25/2019  3:43 PM EDT ----- Regarding: monitoring Can you call patient and see if her monitor was ever received

## 2019-09-25 NOTE — Telephone Encounter (Signed)
Left message for patient to call the office to get a lipid consult with Parm D.

## 2019-09-26 NOTE — Telephone Encounter (Signed)
LMOVM

## 2019-09-30 ENCOUNTER — Telehealth: Payer: Self-pay

## 2019-09-30 NOTE — Telephone Encounter (Signed)
Called patient left message on personal voice mail to call me back to schedule follow up appointment with Dr.Jordan.Also she needs appointment with Lipid clinic.

## 2019-10-02 NOTE — Telephone Encounter (Signed)
Letter sent 10-01-2019

## 2019-10-17 ENCOUNTER — Encounter: Payer: Self-pay | Admitting: General Practice

## 2020-01-08 ENCOUNTER — Inpatient Hospital Stay: Payer: Medicare Other | Attending: Hematology and Oncology

## 2020-01-08 ENCOUNTER — Other Ambulatory Visit: Payer: Self-pay

## 2020-01-08 DIAGNOSIS — D472 Monoclonal gammopathy: Secondary | ICD-10-CM | POA: Diagnosis not present

## 2020-01-08 DIAGNOSIS — Z880 Allergy status to penicillin: Secondary | ICD-10-CM | POA: Diagnosis not present

## 2020-01-08 DIAGNOSIS — Z882 Allergy status to sulfonamides status: Secondary | ICD-10-CM | POA: Insufficient documentation

## 2020-01-08 DIAGNOSIS — Z881 Allergy status to other antibiotic agents status: Secondary | ICD-10-CM | POA: Diagnosis not present

## 2020-01-08 DIAGNOSIS — D649 Anemia, unspecified: Secondary | ICD-10-CM | POA: Diagnosis not present

## 2020-01-08 DIAGNOSIS — Z886 Allergy status to analgesic agent status: Secondary | ICD-10-CM | POA: Diagnosis not present

## 2020-01-08 DIAGNOSIS — Z888 Allergy status to other drugs, medicaments and biological substances status: Secondary | ICD-10-CM | POA: Diagnosis not present

## 2020-01-08 DIAGNOSIS — Z85038 Personal history of other malignant neoplasm of large intestine: Secondary | ICD-10-CM | POA: Diagnosis not present

## 2020-01-08 DIAGNOSIS — Z79899 Other long term (current) drug therapy: Secondary | ICD-10-CM | POA: Insufficient documentation

## 2020-01-08 LAB — COMPREHENSIVE METABOLIC PANEL
ALT: 18 U/L (ref 0–44)
AST: 13 U/L — ABNORMAL LOW (ref 15–41)
Albumin: 3.9 g/dL (ref 3.5–5.0)
Alkaline Phosphatase: 96 U/L (ref 38–126)
Anion gap: 11 (ref 5–15)
BUN: 13 mg/dL (ref 8–23)
CO2: 23 mmol/L (ref 22–32)
Calcium: 9.5 mg/dL (ref 8.9–10.3)
Chloride: 102 mmol/L (ref 98–111)
Creatinine, Ser: 0.77 mg/dL (ref 0.44–1.00)
GFR calc Af Amer: >60 mL/min (ref >60)
GFR calc non Af Amer: >60 mL/min (ref >60)
Glucose, Bld: 114 mg/dL — ABNORMAL HIGH (ref 70–99)
Potassium: 4.4 mmol/L (ref 3.5–5.1)
Sodium: 136 mmol/L (ref 135–145)
Total Bilirubin: 0.3 mg/dL (ref 0.3–1.2)
Total Protein: 8 g/dL (ref 6.5–8.1)

## 2020-01-08 LAB — CBC WITH DIFFERENTIAL/PLATELET
Abs Immature Granulocytes: 0.03 10*3/uL (ref 0.00–0.07)
Basophils Absolute: 0 10*3/uL (ref 0.0–0.1)
Basophils Relative: 1 %
Eosinophils Absolute: 0.1 10*3/uL (ref 0.0–0.5)
Eosinophils Relative: 2 %
HCT: 46.2 % — ABNORMAL HIGH (ref 36.0–46.0)
Hemoglobin: 15.1 g/dL — ABNORMAL HIGH (ref 12.0–15.0)
Immature Granulocytes: 1 %
Lymphocytes Relative: 31 %
Lymphs Abs: 1.9 10*3/uL (ref 0.7–4.0)
MCH: 29.4 pg (ref 26.0–34.0)
MCHC: 32.7 g/dL (ref 30.0–36.0)
MCV: 89.9 fL (ref 80.0–100.0)
Monocytes Absolute: 0.4 10*3/uL (ref 0.1–1.0)
Monocytes Relative: 7 %
Neutro Abs: 3.7 10*3/uL (ref 1.7–7.7)
Neutrophils Relative %: 58 %
Platelets: 290 10*3/uL (ref 150–400)
RBC: 5.14 MIL/uL — ABNORMAL HIGH (ref 3.87–5.11)
RDW: 12.6 % (ref 11.5–15.5)
WBC: 6.2 10*3/uL (ref 4.0–10.5)
nRBC: 0 % (ref 0.0–0.2)

## 2020-01-09 LAB — KAPPA/LAMBDA LIGHT CHAINS
Kappa free light chain: 31.7 mg/L — ABNORMAL HIGH (ref 3.3–19.4)
Kappa, lambda light chain ratio: 1.73 — ABNORMAL HIGH (ref 0.26–1.65)
Lambda free light chains: 18.3 mg/L (ref 5.7–26.3)

## 2020-01-12 LAB — MULTIPLE MYELOMA PANEL, SERUM
Albumin SerPl Elph-Mcnc: 3.9 g/dL (ref 2.9–4.4)
Albumin/Glob SerPl: 1.2 (ref 0.7–1.7)
Alpha 1: 0.3 g/dL (ref 0.0–0.4)
Alpha2 Glob SerPl Elph-Mcnc: 0.9 g/dL (ref 0.4–1.0)
B-Globulin SerPl Elph-Mcnc: 1 g/dL (ref 0.7–1.3)
Gamma Glob SerPl Elph-Mcnc: 1.4 g/dL (ref 0.4–1.8)
Globulin, Total: 3.5 g/dL (ref 2.2–3.9)
IgA: 189 mg/dL (ref 87–352)
IgG (Immunoglobin G), Serum: 1308 mg/dL (ref 586–1602)
IgM (Immunoglobulin M), Srm: 94 mg/dL (ref 26–217)
M Protein SerPl Elph-Mcnc: 0.6 g/dL — ABNORMAL HIGH
Total Protein ELP: 7.4 g/dL (ref 6.0–8.5)

## 2020-01-15 ENCOUNTER — Encounter: Payer: Self-pay | Admitting: Hematology and Oncology

## 2020-01-15 ENCOUNTER — Inpatient Hospital Stay (HOSPITAL_BASED_OUTPATIENT_CLINIC_OR_DEPARTMENT_OTHER): Payer: Medicare Other | Admitting: Hematology and Oncology

## 2020-01-15 ENCOUNTER — Telehealth: Payer: Self-pay | Admitting: Hematology and Oncology

## 2020-01-15 ENCOUNTER — Other Ambulatory Visit: Payer: Self-pay

## 2020-01-15 DIAGNOSIS — Z85038 Personal history of other malignant neoplasm of large intestine: Secondary | ICD-10-CM | POA: Diagnosis not present

## 2020-01-15 DIAGNOSIS — D472 Monoclonal gammopathy: Secondary | ICD-10-CM

## 2020-01-15 DIAGNOSIS — Z881 Allergy status to other antibiotic agents status: Secondary | ICD-10-CM | POA: Diagnosis not present

## 2020-01-15 DIAGNOSIS — Z880 Allergy status to penicillin: Secondary | ICD-10-CM | POA: Diagnosis not present

## 2020-01-15 DIAGNOSIS — D649 Anemia, unspecified: Secondary | ICD-10-CM | POA: Diagnosis not present

## 2020-01-15 DIAGNOSIS — Z79899 Other long term (current) drug therapy: Secondary | ICD-10-CM | POA: Diagnosis not present

## 2020-01-15 NOTE — Assessment & Plan Note (Signed)
Apparently, she had colonoscopy done in January 2018 I recommend she contact her GI specialist to make sure she is scheduled for her next appointment She is a long-term cancer survivor and does not need follow-up imaging or blood work in this regard

## 2020-01-15 NOTE — Progress Notes (Signed)
[facility] OFFICE PROGRESS NOTE  Patient Care Team: Harlan Stains, MD as PCP - General (Family Medicine) [name], Peter M, MD as PCP - Cardiology (Cardiology) Wilford Corner, MD as Consulting Physician (Gastroenterology)  ASSESSMENT & PLAN:  History of colon cancer Apparently, she had colonoscopy done in January 2018 I recommend she contact her GI specialist to make sure she is scheduled for her next appointment She is a long-term cancer survivor and does not need follow-up imaging or blood work in this regard  Monoclonal gammopathy of unknown significance She has no signs of disease progression on blood work Myeloma workup is stable For next year, I recommend she return a week before her scheduled appointment to get blood work done ahead of time We discussed the importance of annual influenza vaccination    Orders Placed This Encounter  Procedures  . CBC with Differential/Platelet    Standing Status:   Standing    Number of Occurrences:   22    Standing Expiration Date:   01/14/2021  . Comprehensive metabolic panel    Standing Status:   Standing    Number of Occurrences:   33    Standing Expiration Date:   01/14/2021  . Kappa/lambda light chains    Standing Status:   Standing    Number of Occurrences:   22    Standing Expiration Date:   01/14/2021  . Multiple Myeloma Panel (SPEP&IFE w/QIG)    Standing Status:   Standing    Number of Occurrences:   22    Standing Expiration Date:   01/14/2021    All questions were answered. The patient knows to call the clinic with any problems, questions or concerns. The total time spent in the appointment was 15 minutes encounter with patients including review of chart and various tests results, discussions about plan of care and coordination of care plan   Heath Lark, MD 01/15/2020 11:54 AM  INTERVAL HISTORY: Please see below for problem oriented charting. She returns for further follow-up Her husband is available  over the phone She feels well No new bone pain no recent infection I reviewed her electronic records She saw her gastroenterologist with plan for colonoscopy in January 2018 I do not have copy of the report She has no recent bowel habit changes  SUMMARY OF ONCOLOGIC HISTORY: Oncology History  History of colon cancer  12/04/2011 Initial Diagnosis   Colon cancer   11/03/2014 Imaging   CT scan showed no evidence of cancer recurrence.   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 The patient have very minimal M spike and asymptomatic. She is observed #2 T3, N0, M0 colon cancer This was discovered when the patient presented with severe anemia. Colonoscopy revealed abnormal lesion and in 12/19/2011 Dr. Donne Hazel perform laparoscopic-assisted transverse colectomy. She received adjuvant Xeloda in August 2013 and switched to IV 5-FU in December 2013 due to poor tolerance to Xeloda. Her last chemotherapy is completed by March 2014. She has normal colonoscopy in July 2014. CT scan from 10/29/2013 show no evidence of recurrence.  REVIEW OF SYSTEMS:   Constitutional: Denies fevers, chills or abnormal weight loss Eyes: Denies blurriness of vision Ears, nose, mouth, throat, and face: Denies mucositis or sore throat Respiratory: Denies cough, dyspnea or wheezes Cardiovascular: Denies palpitation, chest discomfort or lower extremity swelling Gastrointestinal:  Denies nausea, heartburn or change in bowel habits Skin: Denies abnormal skin rashes Lymphatics: Denies new lymphadenopathy or easy bruising Neurological:Denies numbness, tingling or new weaknesses Behavioral/Psych: Mood  is stable, no new changes  All other systems were reviewed with the patient and are negative.  I have reviewed the past medical history, past surgical history, social history and family history with the patient and they are unchanged from previous note.  ALLERGIES:  is allergic to aspirin, erythromycin, lisinopril, niacin and  related, statins, sulfa drugs cross reactors, tetracyclines & related, penicillins, and plavix [clopidogrel bisulfate].  MEDICATIONS:  Current Outpatient Medications  Medication Sig Dispense Refill  . acetaminophen (TYLENOL) 500 MG tablet Take 500-1,000 mg by mouth daily as needed for mild pain or headache.    Marland Kitchen amLODipine (NORVASC) 10 MG tablet Take 1 tablet (10 mg total) by mouth daily. 90 tablet 1  . aspirin 81 MG chewable tablet Chew 1 tablet (81 mg total) by mouth daily.    . Azelastine HCl 0.15 % SOLN Place 1 spray into both nostrils daily as needed (seasonal allergies).   12  . carbamazepine (TEGRETOL XR) 200 MG 12 hr tablet TAKE 3 TABLETS BY MOUTH TWICE A DAY 180 tablet 5  . carvedilol (COREG) 6.25 MG tablet Take 1 tablet (6.25 mg total) by mouth 2 (two) times daily with a meal. 90 tablet 1  . DULoxetine (CYMBALTA) 60 MG capsule Take 60 mg by mouth daily.     Marland Kitchen estradiol (VIVELLE-DOT) 0.05 MG/24HR patch Place 1 patch onto the skin 2 (two) times a week. Sunday, Wednesday    . ethosuximide (ZARONTIN) 250 MG capsule Take 2 in the morning, take 2 at night 120 capsule 11  . ezetimibe (ZETIA) 10 MG tablet Take 1 tablet (10 mg total) by mouth daily. OV NEEDED 90 tablet 0  . ibuprofen (ADVIL,MOTRIN) 600 MG tablet Take 1 tablet (600 mg total) by mouth every 8 (eight) hours as needed. (Patient taking differently: Take 600 mg by mouth every 8 (eight) hours as needed for headache or mild pain. ) 15 tablet 0  . isosorbide mononitrate (IMDUR) 30 MG 24 hr tablet TAKE 1 TABLET (30 MG TOTAL) BY MOUTH DAILY. ** DO NOT CRUSH ** 90 tablet 1  . levothyroxine (SYNTHROID, LEVOTHROID) 50 MCG tablet Take 50 mcg by mouth daily.     . memantine (NAMENDA) 10 MG tablet Take 1 tablet (10 mg total) by mouth 2 (two) times daily. 180 tablet 1  . nitroGLYCERIN (NITROSTAT) 0.4 MG SL tablet Place 1 tablet (0.4 mg total) under the tongue every 5 (five) minutes as needed. For chest pain. 25 tablet 6  . rosuvastatin (CRESTOR)  5 MG tablet Take 1 tablet by mouth up to 3 times weekly as directed 12 tablet 3   No current facility-administered medications for this visit.    PHYSICAL EXAMINATION: ECOG PERFORMANCE STATUS: 0 - Asymptomatic  Vitals:   01/15/20 1106  BP: (!) 158/76  Pulse: (!) 108  Resp: 18  Temp: 97.6 F (36.4 C)  SpO2: 99%   Filed Weights   01/15/20 1106  Weight: 171 lb 12.8 oz (77.9 kg)    GENERAL:alert, no distress and comfortable NEURO: alert & oriented x 3 with fluent speech, no focal motor/sensory deficits  LABORATORY DATA:  I have reviewed the data as listed    Component Value Date/Time   NA 136 01/08/2020 1048   NA 139 08/18/2019 1453   NA 137 07/11/2016 0820   K 4.4 01/08/2020 1048   K 4.4 07/11/2016 0820   CL 102 01/08/2020 1048   CL 106 12/25/2012 0924   CO2 23 01/08/2020 1048   CO2 27 07/11/2016 0820  GLUCOSE 114 (H) 01/08/2020 1048   GLUCOSE 115 07/11/2016 0820   GLUCOSE 105 (H) 12/25/2012 0924   BUN 13 01/08/2020 1048   BUN 15 08/18/2019 1453   BUN 15.6 07/11/2016 0820   CREATININE 0.77 01/08/2020 1048   CREATININE 0.8 07/11/2016 0820   CALCIUM 9.5 01/08/2020 1048   CALCIUM 8.9 07/11/2016 0820   PROT 8.0 01/08/2020 1048   PROT 7.1 08/18/2019 1453   PROT 7.1 07/11/2016 0820   ALBUMIN 3.9 01/08/2020 1048   ALBUMIN 4.2 08/18/2019 1453   ALBUMIN 3.6 07/11/2016 0820   AST 13 (L) 01/08/2020 1048   AST 13 07/11/2016 0820   ALT 18 01/08/2020 1048   ALT 14 07/11/2016 0820   ALKPHOS 96 01/08/2020 1048   ALKPHOS 89 07/11/2016 0820   BILITOT 0.3 01/08/2020 1048   BILITOT <0.2 08/18/2019 1453   BILITOT 0.29 07/11/2016 0820   GFRNONAA >60 01/08/2020 1048   GFRAA >60 01/08/2020 1048    No results found for: SPEP, UPEP  Lab Results  Component Value Date   WBC 6.2 01/08/2020   NEUTROABS 3.7 01/08/2020   HGB 15.1 (H) 01/08/2020   HCT 46.2 (H) 01/08/2020   MCV 89.9 01/08/2020   PLT 290 01/08/2020      Chemistry      Component Value Date/Time   NA 136  01/08/2020 1048   NA 139 08/18/2019 1453   NA 137 07/11/2016 0820   K 4.4 01/08/2020 1048   K 4.4 07/11/2016 0820   CL 102 01/08/2020 1048   CL 106 12/25/2012 0924   CO2 23 01/08/2020 1048   CO2 27 07/11/2016 0820   BUN 13 01/08/2020 1048   BUN 15 08/18/2019 1453   BUN 15.6 07/11/2016 0820   CREATININE 0.77 01/08/2020 1048   CREATININE 0.8 07/11/2016 0820      Component Value Date/Time   CALCIUM 9.5 01/08/2020 1048   CALCIUM 8.9 07/11/2016 0820   ALKPHOS 96 01/08/2020 1048   ALKPHOS 89 07/11/2016 0820   AST 13 (L) 01/08/2020 1048   AST 13 07/11/2016 0820   ALT 18 01/08/2020 1048   ALT 14 07/11/2016 0820   BILITOT 0.3 01/08/2020 1048   BILITOT <0.2 08/18/2019 1453   BILITOT 0.29 07/11/2016 0820

## 2020-01-15 NOTE — Assessment & Plan Note (Signed)
She has no signs of disease progression on blood work Myeloma workup is stable For next year, I recommend she return a week before her scheduled appointment to get blood work done ahead of time We discussed the importance of annual influenza vaccination

## 2020-01-15 NOTE — Telephone Encounter (Signed)
Scheduled appts per 7/15 sch msg. Gave pt a print out of AVS.  

## 2020-03-14 ENCOUNTER — Other Ambulatory Visit: Payer: Self-pay | Admitting: Neurology

## 2020-04-02 DIAGNOSIS — Z23 Encounter for immunization: Secondary | ICD-10-CM | POA: Diagnosis not present

## 2020-04-20 ENCOUNTER — Other Ambulatory Visit: Payer: Self-pay | Admitting: Cardiology

## 2020-04-20 NOTE — Telephone Encounter (Signed)
Rx has been sent to the pharmacy electronically. ° °

## 2020-05-13 DIAGNOSIS — G720 Drug-induced myopathy: Secondary | ICD-10-CM | POA: Diagnosis not present

## 2020-05-13 DIAGNOSIS — I25118 Atherosclerotic heart disease of native coronary artery with other forms of angina pectoris: Secondary | ICD-10-CM | POA: Diagnosis not present

## 2020-05-13 DIAGNOSIS — M797 Fibromyalgia: Secondary | ICD-10-CM | POA: Diagnosis not present

## 2020-05-13 DIAGNOSIS — I10 Essential (primary) hypertension: Secondary | ICD-10-CM | POA: Diagnosis not present

## 2020-05-13 DIAGNOSIS — D472 Monoclonal gammopathy: Secondary | ICD-10-CM | POA: Diagnosis not present

## 2020-05-13 DIAGNOSIS — E2839 Other primary ovarian failure: Secondary | ICD-10-CM | POA: Diagnosis not present

## 2020-05-13 DIAGNOSIS — E785 Hyperlipidemia, unspecified: Secondary | ICD-10-CM | POA: Diagnosis not present

## 2020-05-13 DIAGNOSIS — Z23 Encounter for immunization: Secondary | ICD-10-CM | POA: Diagnosis not present

## 2020-05-13 DIAGNOSIS — F324 Major depressive disorder, single episode, in partial remission: Secondary | ICD-10-CM | POA: Diagnosis not present

## 2020-05-13 DIAGNOSIS — E039 Hypothyroidism, unspecified: Secondary | ICD-10-CM | POA: Diagnosis not present

## 2020-05-13 DIAGNOSIS — E559 Vitamin D deficiency, unspecified: Secondary | ICD-10-CM | POA: Diagnosis not present

## 2020-05-13 DIAGNOSIS — Z0000 Encounter for general adult medical examination without abnormal findings: Secondary | ICD-10-CM | POA: Diagnosis not present

## 2020-05-15 ENCOUNTER — Ambulatory Visit: Payer: Medicare Other | Attending: Internal Medicine

## 2020-05-15 DIAGNOSIS — Z23 Encounter for immunization: Secondary | ICD-10-CM

## 2020-05-15 NOTE — Progress Notes (Signed)
° °  Covid-19 Vaccination Clinic  Name:  Rachel Clark    MRN: [id number] DOB: 1950/10/31  05/15/2020  Rachel Clark was observed post Covid-19 immunization for 15 minutes without incident. She was provided with Vaccine Information Sheet and instruction to access the V-Safe system.   Rachel Clark was instructed to call 911 with any severe reactions post vaccine:  Difficulty breathing   Swelling of face and throat   A fast heartbeat   A bad rash all over body   Dizziness and weakness   Immunizations Administered    No immunizations on file.

## 2020-05-31 ENCOUNTER — Telehealth: Payer: Self-pay | Admitting: Cardiology

## 2020-05-31 NOTE — Telephone Encounter (Signed)
Attempt to return call-no answer, left detailed message to go to ER for syncopal episodes.     Last OV 2019-CAD, CABG, PPM s/p CHB, anemia.

## 2020-05-31 NOTE — Telephone Encounter (Signed)
   Pt c/o Syncope: STAT if syncope occurred within 30 minutes and pt complains of lightheadedness High Priority if episode of passing out, completely, today or in last 24 hours   1. Did you pass out today? yes  2. When is the last time you passed out? 30 mins  3. Has this occurred multiple times? yes  4. Did you have any symptoms prior to passing out? Fatigue     Pt's husband said this happened multiple times now. He would like to know if he needs to bring pt in the ED  Called triage, after 5 no answer send triage with high priority

## 2020-06-01 NOTE — Telephone Encounter (Signed)
Reviewed chart. Does not look like patient was seen in a [facility] ED overnight. Tried to call patient again. Immediately went to voicemail. Left message reiterating recommendation to go to the ED for further evaluation if she has had multiple syncopal episodes.  Darreld Mclean, PA-C 06/01/2020 8:28 AM

## 2020-06-01 NOTE — Telephone Encounter (Signed)
Called to follow up with patient. She was home and never wend to the ED. She stated she thinks she "tripped over her feet but not sure really what happened". She has not complaints at this time of SOB, chest pain, dizziness. She stated she went to bed last night and rested well and feels great. She stated she has an appointment in our office next week (12/7 @ 9:45a. With Thomasene Mohair) and will make sure to come. Informed patient that if this happens please do go to the emergency room and get evaluated. She agreed and no additional questions at this time.

## 2020-06-06 NOTE — Progress Notes (Unsigned)
Cardiology Clinic Note   Patient Name: Rachel Clark Date of Encounter: 06/08/2020  Primary Care Provider:  Harlan Stains, MD Primary Cardiologist:  Peter [name], MD  Patient Profile    Rachel Clark 69 year old female presents the clinic today for follow-up evaluation of her syncopal episodes, coronary artery disease and essential hypertension.  Past Medical History    Past Medical History:  Diagnosis Date  . Arthritis    "fingers" (06/29/2016)  . Chronic lower back pain   . Colon cancer ([place]) 12/04/2011   s/p Laparoscopic-assisted transverse colectomy on 12/19/2011 by Dr. Donne Hazel.  pT3 N0 M0.   . Coronary artery disease    a. remote NSTEMI with PCI to the RCA; b. S/P CABG in 2007; c. 2010 - 2 v CAD with L-LAD and S-RCA patent, small caliber Dx 70% (treated medically - no amenable to PCI), CFX free of significant disease, normal LVF. d. 10/2014 Lexi MV: EF 61%, no ischemia/infarct.  . Depression   . Dyslipidemia   . Fibromyalgia   . Gastric ulcer   . GERD (gastroesophageal reflux disease)   . 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 Headache    "weekly" (06/29/2016)  . Hyperlipidemia   . Hypothyroid   . Iron deficiency anemia 11/17/2011  . Memory change 11/13/2016  . Monoclonal gammopathy of unknown significance   . Myocardial infarction ([place]) 2007  . Presence of permanent cardiac pacemaker   . Syncope and collapse    pacemaker implanted  . Unstable angina [facility])    Past Surgical History:  Procedure Laterality Date  . ANTERIOR CERVICAL DECOMP/DISCECTOMY FUSION  1980's  . BACK SURGERY    . CARDIAC CATHETERIZATION  05/20/2009   obstructive native vessel disease in LAD, RCA, and first diagonal, patent vein graft to distal RCA and LIMA to LAD,normal. ef 60%  . CARDIAC CATHETERIZATION N/A 03/24/2015   Procedure: Left Heart Cath and Cors/Grafts Angiography;  Surgeon: Wellington Hampshire, MD;  Location:  [place] [facility];  Service: Cardiovascular;  Laterality: N/A;  . CARDIAC CATHETERIZATION N/A 07/28/2015   Procedure: Left Heart Cath and Coronary Angiography;  Surgeon: Troy Sine, MD;  Location: [place] [facility];  Service: Cardiovascular;  Laterality: N/A;  . CARDIAC CATHETERIZATION N/A 11/09/2015   Procedure: Coronary Stent Intervention;  Surgeon: Sherren Mocha, MD;  Location: [place] [facility];  Service: Cardiovascular;  Laterality: N/A;  . CARDIAC CATHETERIZATION N/A 11/09/2015   Procedure: Left Heart Cath and Coronary Angiography;  Surgeon: Sherren Mocha, MD;  Location: [place] [facility];  Service: Cardiovascular;  Laterality: N/A;  . CARDIAC CATHETERIZATION N/A 06/29/2016   Procedure: Left Heart Cath and Coronary Angiography;  Surgeon: Nelva Bush, MD;  Location: [place] [facility];  Service: Cardiovascular;  Laterality: N/A;  . CARDIAC CATHETERIZATION N/A 06/29/2016   Procedure: Intravascular Pressure Wire/FFR Study;  Surgeon: Nelva Bush, MD;  Location: [place] [facility];  Service: Cardiovascular;  Laterality: N/A;  . CARDIAC CATHETERIZATION N/A 06/29/2016   Procedure: Coronary Balloon Angioplasty;  Surgeon: Nelva Bush, MD;  Location: [place] [facility];  Service: Cardiovascular;  Laterality: N/A;  . COLON RESECTION  12/19/2011   Procedure: COLON RESECTION LAPAROSCOPIC;  Surgeon: Rolm Bookbinder, MD;  Location: [place];  Service: General;  Laterality: N/A;  laparoscopic hand assisted partial colon resection  . COLON SURGERY    . COLONOSCOPY    . CORONARY ANGIOPLASTY WITH STENT PLACEMENT  ~ 2007  1 stent  . CORONARY ARTERY BYPASS GRAFT  2007   "CABG X2"  . DILATION AND CURETTAGE OF UTERUS  1973  . EP IMPLANTABLE DEVICE N/A 03/25/2015   MDT Advisa DR pacemaker implanted by Dr Rayann Heman for transient complete heart block and syncope  . INSERT / REPLACE / REMOVE PACEMAKER    . LEFT HEART CATH AND CORS/GRAFTS ANGIOGRAPHY N/A 02/21/2018   Procedure: LEFT HEART CATH AND  CORS/GRAFTS ANGIOGRAPHY;  Surgeon: Lorretta Harp, MD;  Location: [place] [facility];  Service: Cardiovascular;  Laterality: N/A;  . PORT-A-CATH REMOVAL N/A 06/12/2013   Procedure: REMOVAL PORT-A-CATH;  Surgeon: Rolm Bookbinder, MD;  Location: [place];  Service: General;  Laterality: N/A;  . PORTACATH PLACEMENT  06/04/2012   Procedure: INSERTION PORT-A-CATH;  Surgeon: Rolm Bookbinder, MD;  Location: [place];  Service: General;  Laterality: N/A;  Insertion of port-a-cath   . POSTERIOR LAMINECTOMY / DECOMPRESSION CERVICAL SPINE  1990s  . VAGINAL HYSTERECTOMY      Allergies  Allergies  Allergen Reactions  . Aspirin Other (See Comments)    GI upset- can tolerate 81 mg ASA, just not full doses  . Erythromycin Nausea Only    Gi upset  . Lisinopril Cough  . Niacin And Related Other (See Comments)    Pt does not recall reactions   . Statins Other (See Comments)    Muscle pain  . Sulfa Drugs Cross Reactors Swelling  . Tetracyclines & Related Nausea Only  . Penicillins Nausea Only and Rash    Has patient had a PCN reaction causing immediate rash, facial/tongue/throat swelling, SOB or lightheadedness with hypotension: Yes Has patient had a PCN reaction causing severe rash involving mucus membranes or skin necrosis: No Has patient had a PCN reaction that required hospitalization No Has patient had a PCN reaction occurring within the last 10 years: No If all of the above answers are "NO", then may proceed with Cephalosporin use.   Marland Kitchen Plavix [Clopidogrel Bisulfate] Rash    History of Present Illness    Ms. Spayd has a PMH of sick sinus syndrome, essential hypertension, GERD, hypothyroidism, HLD, PPM 9/16 (complete heart block/syncope), and coronary artery disease status post CABG times 08/2005.  Her husband contacted nurse triage line on 05/31/2020 and indicated that she  had passed out on multiple occasions and was having increased fatigue.  It was recommended that she  present to the emergency department for follow-up evaluation.  She did not follow-up in the ED.  She was contacted 06/01/2020 and indicated she had gone to bed and was feeling very well.  She was instructed to present to the emergency department for further evaluation of further episodes occurred.  She presents to the clinic today for follow-up with her husband evaluation and states she feels well.  Her husband indicates that she had 2 separate incidents of falls that were witnessed.  Falls were 10 days apart and happened in the same location.  Both were around midday.  She had gotten up from the couch and was walking to her bedroom when she went down.  It does not appear she lost consciousness or had any seizure type activity.  There is some question as to whether it was related to mechanical fall, orthostatic hypotension, or cardiac related.  She has not had any further incidents in around a week.  We discussed the importance of connecting her device transmitter so that we may review her pacemaker.  They expressed understanding.  I will have  him follow-up with EP, have a [employer] representative presents with her house for assistance with setting up the device, and have her follow-up with Dr. [name] in 3 months.  Today she denies chest pain, shortness of breath, lower extremity edema, fatigue, palpitations, melena, hematuria, hemoptysis, diaphoresis, weakness, presyncope, syncope, orthopnea, and PND.   Home Medications    Prior to Admission medications   Medication Sig Start Date End Date Taking? Authorizing Provider  acetaminophen (TYLENOL) 500 MG tablet Take 500-1,000 mg by mouth daily as needed for mild pain or headache.    [provider]  amLODipine (NORVASC) 10 MG tablet Take 1 tablet (10 mg total) by mouth daily. 04/11/18   [name], PA  aspirin 81 MG chewable tablet Chew 1 tablet (81 mg total) by mouth daily. 06/30/16   Arbutus Leas, NP  Azelastine HCl 0.15 % SOLN Place 1  spray into both nostrils daily as needed (seasonal allergies).  12/22/14   [provider]  carbamazepine (TEGRETOL XR) 200 MG 12 hr tablet TAKE 3 TABLETS BY MOUTH TWICE A DAY 03/15/20   Kathrynn Ducking, MD  carvedilol (COREG) 6.25 MG tablet Take 1 tablet (6.25 mg total) by mouth 2 (two) times daily with a meal. 04/11/18   Duke, Tami Lin, PA  DULoxetine (CYMBALTA) 60 MG capsule Take 60 mg by mouth daily.     [provider]  estradiol (VIVELLE-DOT) 0.05 MG/24HR patch Place 1 patch onto the skin 2 (two) times a week. Sunday, Wednesday    [provider]  ethosuximide (ZARONTIN) 250 MG capsule Take 2 in the morning, take 2 at night 08/18/19   Suzzanne Cloud, NP  ezetimibe (ZETIA) 10 MG tablet Take 1 tablet (10 mg total) by mouth daily. OV NEEDED 03/11/19   [name], Peter M, MD  ibuprofen (ADVIL,MOTRIN) 600 MG tablet Take 1 tablet (600 mg total) by mouth every 8 (eight) hours as needed. Patient taking differently: Take 600 mg by mouth every 8 (eight) hours as needed for headache or mild pain.  04/07/17   Jola Schmidt, MD  isosorbide mononitrate (IMDUR) 30 MG 24 hr tablet TAKE 1 TABLET (30 MG TOTAL) BY MOUTH DAILY. ** DO NOT CRUSH ** 04/20/20   [name], Peter M, MD  levothyroxine (SYNTHROID, LEVOTHROID) 50 MCG tablet Take 50 mcg by mouth daily.     [provider]  memantine (NAMENDA) 10 MG tablet Take 1 tablet (10 mg total) by mouth 2 (two) times daily. 08/18/19   Suzzanne Cloud, NP  nitroGLYCERIN (NITROSTAT) 0.4 MG SL tablet Place 1 tablet (0.4 mg total) under the tongue every 5 (five) minutes as needed. For chest pain. 02/22/18   Leanor Kail, PA  rosuvastatin (CRESTOR) 5 MG tablet Take 1 tablet by mouth up to 3 times weekly as directed 04/11/18   [name], PA    Family History    Family History  Problem Relation Age of Onset  . Heart attack Father   . Heart disease Father   . Heart attack Brother 40  . Cancer Mother        lung  . Stroke  Neg Hx    She indicated that her mother is deceased. She indicated that her father is deceased. She indicated that her sister is deceased. She indicated that both of her brothers are alive. She indicated that her maternal grandmother is deceased. She indicated that her maternal grandfather is deceased. She indicated that her paternal grandmother is deceased. She indicated that  her paternal grandfather is deceased. She indicated that the status of her neg hx is unknown.  Social History    Social History   Socioeconomic History  . Marital status: Married    Spouse name: Not on file  . Number of children: 2  . Years of education: 76  . Highest education level: Not on file  Occupational History  . Occupation: office work    Fish farm manager: [employer]    Comment: unemployed  Tobacco Use  . Smoking status: Former Smoker    Packs/day: 0.30    Years: 20.00    Pack years: 6.00    Types: Cigarettes    Quit date: 06/06/1981    Years since quitting: 39.0  . Smokeless tobacco: Never Used  Vaping Use  . Vaping Use: Never used  Substance and Sexual Activity  . Alcohol use: Yes    Alcohol/week: 1.0 standard drink    Types: 1 Glasses of wine per week  . Drug use: No  . Sexual activity: Not Currently    Birth control/protection: Surgical  Other Topics Concern  . Not on file  Social History Narrative   Patient is married with 2 children.   Patient is right handed.   Patient has a high school education with some college education.   Patient drinks 2 cups daily.   Social Determinants of Health   Financial Resource Strain:   . Difficulty of Paying Living Expenses: Not on file  Food Insecurity:   . Worried About Charity fundraiser in the Last Year: Not on file  . Ran Out of Food in the Last Year: Not on file  Transportation Needs:   . Lack of Transportation (Medical): Not on file  . Lack of Transportation (Non-Medical): Not on file  Physical Activity:   . Days of Exercise per  Week: Not on file  . Minutes of Exercise per Session: Not on file  Stress:   . Feeling of Stress : Not on file  Social Connections:   . Frequency of Communication with Friends and Family: Not on file  . Frequency of Social Gatherings with Friends and Family: Not on file  . Attends Religious Services: Not on file  . Active Member of Clubs or Organizations: Not on file  . Attends Archivist Meetings: Not on file  . Marital Status: Not on file  Intimate Partner Violence:   . Fear of Current or Ex-Partner: Not on file  . Emotionally Abused: Not on file  . Physically Abused: Not on file  . Sexually Abused: Not on file     Review of Systems    General:  No chills, fever, night sweats or weight changes.  Cardiovascular:  No chest pain, dyspnea on exertion, edema, orthopnea, palpitations, paroxysmal nocturnal dyspnea. Dermatological: No rash, lesions/masses Respiratory: No cough, dyspnea Urologic: No hematuria, dysuria Abdominal:   No nausea, vomiting, diarrhea, bright red blood per rectum, melena, or hematemesis Neurologic:  No visual changes, wkns, changes in mental status. All other systems reviewed and are otherwise negative except as noted above.  Physical Exam    VS:  BP 128/70 (BP Location: Left Arm, Patient Position: Sitting, Cuff Size: Normal)   Pulse 92   Ht 5\' 5"  (1.651 m)   Wt 172 lb 9.6 oz (78.3 kg)   BMI 28.72 kg/m  , BMI Body mass index is 28.72 kg/m. GEN: Well nourished, well developed, in no acute distress. HEENT: normal. Neck: Supple, no JVD, carotid bruits, or  masses. Cardiac: RRR, no murmurs, rubs, or gallops. No clubbing, cyanosis, edema.  Radials/DP/PT 2+ and equal bilaterally.  Respiratory:  Respirations regular and unlabored, clear to auscultation bilaterally. GI: Soft, nontender, nondistended, BS + x 4. MS: no deformity or atrophy. Skin: warm and dry, no rash. Neuro:  Strength and sensation are intact. Psych: Normal affect.  Accessory  Clinical Findings    Recent Labs: 01/08/2020: ALT 18; BUN 13; Creatinine, Ser 0.77; Hemoglobin 15.1; Platelets 290; Potassium 4.4; Sodium 136   Recent Lipid Panel    Component Value Date/Time   CHOL 229 (H) 02/21/2018 0254   TRIG 97 02/21/2018 0254   HDL 72 02/21/2018 0254   CHOLHDL 3.2 02/21/2018 0254   VLDL 19 02/21/2018 0254   LDLCALC 138 (H) 02/21/2018 0254    ECG personally reviewed by me today-normal sinus rhythm left atrial enlargement 92 bpm- No acute changes  Echocardiogram 07/26/2015 Study Conclusions   - Left ventricle: The cavity size was normal. There was mild  concentric hypertrophy. Systolic function was normal. The  estimated ejection fraction was in the range of 60% to 65%. Wall  motion was normal; there were no regional wall motion  abnormalities. Left ventricular diastolic function parameters  were normal.  - Mitral valve: There was mild regurgitation.  - Atrial septum: No defect or patent foramen ovale was identified  bu color flow Doppler.  - Tricuspid valve: There was trivial regurgitation.  - Inferior vena cava: The vessel was normal in size. The  respirophasic diameter changes were in the normal range (>= 50%),  consistent with normal central venous pressure.  - Global longitudinal strain -18.6%.   Device check in clinic 08/16/2015 Normal device function, no mode switch or high ventricular rates.  Histograms appropriate.  Estimated longevity 10.5 years.  Patient enrolled in [place].  Plan was made to follow every 3 months remotely and in clinic annually.  No device check since that time.  Assessment & Plan   1.  Syncope-EKG shows normal sinus rhythm possible left atrial enlargement 92 bpm.  Received [employer] dual-chamber pacemaker for transient syncope and AV block 03/25/2015.  Patient contacted nurse triage line and indicated she had several episodes of syncope.  Reports receiving transmitter box but does not has not had a set up.  We  will send representative from [employer] to their house to help with transmitter installed. Follow-up with EP  Essential hypertension-BP today 128/70.  Does not monitor at home. Continue amlodipine, carvedilol Heart healthy low-sodium diet-salty 6 given Increase physical activity as tolerated  Coronary artery disease status post CABG 2x  2007-  No chest pain today.  Denies recent episodes of arm, neck or chest discomfort. Continue aspirin,, amlodipine, carvedilol, Imdur, nitroglycerin Heart healthy low-sodium diet-salty 6 given Increase physical activity as tolerated  Hyperlipidemia-LDL 138 on 02/21/2018 Ezetimibe, rosuvastatin Heart healthy low-sodium high-fiber diet Increase physical activity as tolerated  Disposition: Follow-up with EP in 1 month and Dr. [name] in 3 months.  Jossie Ng. Cleaver NP-C    06/08/2020, 10:19 AM [address] [place] [address] Office [phone] Fax [phone]  Notice: This dictation was prepared with Dragon dictation along with smaller phrase technology. Any transcriptional errors that result from this process are unintentional and may not be corrected upon review.

## 2020-06-08 ENCOUNTER — Ambulatory Visit (INDEPENDENT_AMBULATORY_CARE_PROVIDER_SITE_OTHER): Payer: Medicare Other | Admitting: General Practice

## 2020-06-08 ENCOUNTER — Encounter: Payer: Self-pay | Admitting: General Practice

## 2020-06-08 ENCOUNTER — Other Ambulatory Visit: Payer: Self-pay | Admitting: Cardiology

## 2020-06-08 ENCOUNTER — Other Ambulatory Visit: Payer: Self-pay

## 2020-06-08 VITALS — BP 128/70 | HR 92 | Ht 65.0 in | Wt 172.6 lb

## 2020-06-08 DIAGNOSIS — Z950 Presence of cardiac pacemaker: Secondary | ICD-10-CM | POA: Diagnosis not present

## 2020-06-08 DIAGNOSIS — I10 Essential (primary) hypertension: Secondary | ICD-10-CM

## 2020-06-08 DIAGNOSIS — I2510 Atherosclerotic heart disease of native coronary artery without angina pectoris: Secondary | ICD-10-CM | POA: Diagnosis not present

## 2020-06-08 DIAGNOSIS — E782 Mixed hyperlipidemia: Secondary | ICD-10-CM | POA: Diagnosis not present

## 2020-06-08 DIAGNOSIS — R55 Syncope and collapse: Secondary | ICD-10-CM | POA: Diagnosis not present

## 2020-06-08 NOTE — Patient Instructions (Signed)
Medication Instructions:  The current medical regimen is effective;  continue present plan and medications as directed. Please refer to the Current Medication list given to you today. *If you need a refill on your cardiac medications before your next appointment, please call your pharmacy*  Lab Work:   Testing/Procedures:  NONE    NONE  Special Instructions PLEASE READ AND FOLLOW SALTY 6-ATTACHED-1,800mg  daily  Follow-Up: FOLLOW UP IN 1 MONTH WITH DR [facility]  Your next appointment:  3 month(s) In Person with Peter [name], MD OR IF UNAVAILABLE Re[place], FNP-C   At [facility], you and your health needs are our priority.  As part of our continuing mission to provide you with exceptional heart care, we have created designated Provider Care Teams.  These Care Teams include your primary Cardiologist (physician) and Advanced Practice Providers (APPs -  Physician Assistants and Nurse Practitioners) who all work together to provide you with the care you need, when you need it.  We recommend signing up for the patient portal called "MyChart".  Sign up information is provided on this After Visit Summary.  MyChart is used to connect with patients for Virtual Visits (Telemedicine).  Patients are able to view lab/test results, encounter notes, upcoming appointments, etc.  Non-urgent messages can be sent to your provider as well.   To learn more about what you can do with MyChart, go to [email or web address].              6 SALTY THINGS TO AVOID     1,800MG  DAILY

## 2020-07-12 ENCOUNTER — Ambulatory Visit (INDEPENDENT_AMBULATORY_CARE_PROVIDER_SITE_OTHER): Payer: Medicare Other | Admitting: Internal Medicine

## 2020-07-12 ENCOUNTER — Encounter: Payer: Self-pay | Admitting: Internal Medicine

## 2020-07-12 ENCOUNTER — Other Ambulatory Visit: Payer: Self-pay

## 2020-07-12 VITALS — BP 126/84 | HR 96 | Ht 65.0 in | Wt 173.0 lb

## 2020-07-12 DIAGNOSIS — I10 Essential (primary) hypertension: Secondary | ICD-10-CM

## 2020-07-12 DIAGNOSIS — I495 Sick sinus syndrome: Secondary | ICD-10-CM | POA: Diagnosis not present

## 2020-07-12 LAB — CUP PACEART INCLINIC DEVICE CHECK
Battery Remaining Longevity: 70 mo
Battery Voltage: 3 V
Brady Statistic AP VP Percent: 0.01 %
Brady Statistic AP VS Percent: 2.86 %
Brady Statistic AS VP Percent: 0.07 %
Brady Statistic AS VS Percent: 97.05 %
Brady Statistic RA Percent Paced: 2.87 %
Brady Statistic RV Percent Paced: 0.09 %
Date Time Interrogation Session: 20220110164448
Implantable Lead Implant Date: 20160922
Implantable Lead Implant Date: 20160922
Implantable Lead Location: 753859
Implantable Lead Location: 753860
Implantable Lead Model: 5076
Implantable Lead Model: 5076
Implantable Pulse Generator Implant Date: 20160922
Lead Channel Impedance Value: 1273 Ohm
Lead Channel Impedance Value: 1311 Ohm
Lead Channel Impedance Value: 456 Ohm
Lead Channel Impedance Value: 551 Ohm
Lead Channel Pacing Threshold Amplitude: 1 V
Lead Channel Pacing Threshold Amplitude: 1.5 V
Lead Channel Pacing Threshold Pulse Width: 0.4 ms
Lead Channel Pacing Threshold Pulse Width: 0.4 ms
Lead Channel Sensing Intrinsic Amplitude: 12.625 mV
Lead Channel Sensing Intrinsic Amplitude: 3 mV
Lead Channel Setting Pacing Amplitude: 2.5 V
Lead Channel Setting Pacing Amplitude: 2.5 V
Lead Channel Setting Pacing Pulse Width: 0.4 ms
Lead Channel Setting Sensing Sensitivity: 2.8 mV

## 2020-07-12 NOTE — Progress Notes (Signed)
PCP: Harlan Stains, MD Primary Cardiologist: Dr [name] Primary EP:  Dr Rayann Heman  Rachel Clark is a 70 y.o. female who presents today for routine electrophysiology followup.  Since last being seen in our clinic, the patient reports doing reasonably well.  She has had several episodes of postural dizziness and syncope over the past month.  She says that this has been attributed to dehydration.  She has found improvement with increased hydration.  Today, she denies symptoms of palpitations, chest pain, shortness of breath, or lower extremity edema.  The patient is otherwise without complaint today.   Past Medical History:  Diagnosis Date  . Arthritis    "fingers" (06/29/2016)  . Chronic lower back pain   . Colon cancer ([place]) 12/04/2011   s/p Laparoscopic-assisted transverse colectomy on 12/19/2011 by Dr. Donne Hazel.  pT3 N0 M0.   . Coronary artery disease    a. remote NSTEMI with PCI to the RCA; b. S/P CABG in 2007; c. 2010 - 2 v CAD with L-LAD and S-RCA patent, small caliber Dx 70% (treated medically - no amenable to PCI), CFX free of significant disease, normal LVF. d. 10/2014 Lexi MV: EF 61%, no ischemia/infarct.  . Depression   . Dyslipidemia   . Fibromyalgia   . Gastric ulcer   . GERD (gastroesophageal reflux disease)   . 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 Headache    "weekly" (06/29/2016)  . Hyperlipidemia   . Hypothyroid   . Iron deficiency anemia 11/17/2011  . Memory change 11/13/2016  . Monoclonal gammopathy of unknown significance   . Myocardial infarction ([place]) 2007  . Presence of permanent cardiac pacemaker   . Syncope and collapse    pacemaker implanted  . Unstable angina [facility])    Past Surgical History:  Procedure Laterality Date  . ANTERIOR CERVICAL DECOMP/DISCECTOMY FUSION  1980's  . BACK SURGERY    . CARDIAC CATHETERIZATION  05/20/2009   obstructive native vessel disease in LAD, RCA, and  first diagonal, patent vein graft to distal RCA and LIMA to LAD,normal. ef 60%  . CARDIAC CATHETERIZATION N/A 03/24/2015   Procedure: Left Heart Cath and Cors/Grafts Angiography;  Surgeon: Wellington Hampshire, MD;  Location: [place] [facility];  Service: Cardiovascular;  Laterality: N/A;  . CARDIAC CATHETERIZATION N/A 07/28/2015   Procedure: Left Heart Cath and Coronary Angiography;  Surgeon: Troy Sine, MD;  Location: [place] [facility];  Service: Cardiovascular;  Laterality: N/A;  . CARDIAC CATHETERIZATION N/A 11/09/2015   Procedure: Coronary Stent Intervention;  Surgeon: Sherren Mocha, MD;  Location: [place] [facility];  Service: Cardiovascular;  Laterality: N/A;  . CARDIAC CATHETERIZATION N/A 11/09/2015   Procedure: Left Heart Cath and Coronary Angiography;  Surgeon: Sherren Mocha, MD;  Location: [place] [facility];  Service: Cardiovascular;  Laterality: N/A;  . CARDIAC CATHETERIZATION N/A 06/29/2016   Procedure: Left Heart Cath and Coronary Angiography;  Surgeon: Nelva Bush, MD;  Location: [place] [facility];  Service: Cardiovascular;  Laterality: N/A;  . CARDIAC CATHETERIZATION N/A 06/29/2016   Procedure: Intravascular Pressure Wire/FFR Study;  Surgeon: Nelva Bush, MD;  Location: [place] [facility];  Service: Cardiovascular;  Laterality: N/A;  . CARDIAC CATHETERIZATION N/A 06/29/2016   Procedure: Coronary Balloon Angioplasty;  Surgeon: Nelva Bush, MD;  Location: [place] [facility];  Service: Cardiovascular;  Laterality: N/A;  . COLON RESECTION  12/19/2011   Procedure: COLON RESECTION LAPAROSCOPIC;  Surgeon: Rodman Key  Donne Hazel, MD;  Location: [place];  Service: General;  Laterality: N/A;  laparoscopic hand assisted partial colon resection  . COLON SURGERY    . COLONOSCOPY    . CORONARY ANGIOPLASTY WITH STENT PLACEMENT  ~ 2007   1 stent  . CORONARY ARTERY BYPASS GRAFT  2007   "CABG X2"  . DILATION AND CURETTAGE OF UTERUS  1973  . EP IMPLANTABLE DEVICE N/A 03/25/2015   MDT Advisa  DR pacemaker implanted by Dr Rayann Heman for transient complete heart block and syncope  . INSERT / REPLACE / REMOVE PACEMAKER    . LEFT HEART CATH AND CORS/GRAFTS ANGIOGRAPHY N/A 02/21/2018   Procedure: LEFT HEART CATH AND CORS/GRAFTS ANGIOGRAPHY;  Surgeon: Lorretta Harp, MD;  Location: [place] [facility];  Service: Cardiovascular;  Laterality: N/A;  . PORT-A-CATH REMOVAL N/A 06/12/2013   Procedure: REMOVAL PORT-A-CATH;  Surgeon: Rolm Bookbinder, MD;  Location: [place];  Service: General;  Laterality: N/A;  . PORTACATH PLACEMENT  06/04/2012   Procedure: INSERTION PORT-A-CATH;  Surgeon: Rolm Bookbinder, MD;  Location: [place];  Service: General;  Laterality: N/A;  Insertion of port-a-cath   . POSTERIOR LAMINECTOMY / DECOMPRESSION CERVICAL SPINE  1990s  . VAGINAL HYSTERECTOMY      ROS- all systems are reviewed and negative except as per HPI above  Current Outpatient Medications  Medication Sig Dispense Refill  . acetaminophen (TYLENOL) 500 MG tablet Take 500-1,000 mg by mouth daily as needed for mild pain or headache.    Marland Kitchen amLODipine (NORVASC) 10 MG tablet Take 1 tablet (10 mg total) by mouth daily. 90 tablet 1  . aspirin 81 MG chewable tablet Chew 1 tablet (81 mg total) by mouth daily.    . Azelastine HCl 0.15 % SOLN Place 1 spray into both nostrils daily as needed (seasonal allergies).   12  . carbamazepine (TEGRETOL XR) 200 MG 12 hr tablet TAKE 3 TABLETS BY MOUTH TWICE A DAY 180 tablet 5  . carvedilol (COREG) 6.25 MG tablet Take 1 tablet (6.25 mg total) by mouth 2 (two) times daily with a meal. 90 tablet 1  . DULoxetine (CYMBALTA) 60 MG capsule Take 60 mg by mouth daily.     Marland Kitchen estradiol (VIVELLE-DOT) 0.05 MG/24HR patch Place 1 patch onto the skin 2 (two) times a week. Sunday, Wednesday    . ethosuximide (ZARONTIN) 250 MG capsule Take 2 in the morning, take 2 at night 120 capsule 11  . ezetimibe (ZETIA) 10 MG tablet Take 1 tablet (10 mg total) by mouth daily. OV NEEDED 90  tablet 0  . ibuprofen (ADVIL,MOTRIN) 600 MG tablet Take 1 tablet (600 mg total) by mouth every 8 (eight) hours as needed. (Patient taking differently: Take 600 mg by mouth every 8 (eight) hours as needed for headache or mild pain.) 15 tablet 0  . isosorbide mononitrate (IMDUR) 30 MG 24 hr tablet TAKE 1 TABLET (30 MG TOTAL) BY MOUTH DAILY. ** DO NOT CRUSH ** 90 tablet 1  . levothyroxine (SYNTHROID, LEVOTHROID) 50 MCG tablet Take 50 mcg by mouth daily.    . memantine (NAMENDA) 10 MG tablet Take 1 tablet (10 mg total) by mouth 2 (two) times daily. 180 tablet 1  . nitroGLYCERIN (NITROSTAT) 0.4 MG SL tablet Place 1 tablet (0.4 mg total) under the tongue every 5 (five) minutes as needed. For chest pain. 25 tablet 6  . rosuvastatin (CRESTOR) 5 MG tablet Take 1 tablet by mouth up to 3 times weekly as directed 12 tablet 3  No current facility-administered medications for this visit.    Physical Exam: Vitals:   07/12/20 1539  BP: 126/84  Pulse: 96  SpO2: 97%  Weight: 173 lb (78.5 kg)  Height: 5\' 5"  (1.651 m)    GEN- The patient is well appearing, alert but confused.  Husband is with her today to assist with history Head- normocephalic, atraumatic Eyes-  Sclera clear, conjunctiva pink Ears- hearing intact Oropharynx- clear Lungs-  normal work of breathing Chest- pacemaker pocket is well healed Heart- Regular rate and rhythm  GI- soft  Extremities- no clubbing, cyanosis, or edema  Pacemaker interrogation- reviewed in detail today,  See PACEART report  ekg tracing ordered today is personally reviewed and shows sinus  Assessment and Plan:  1. 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  2. HTN Stable No change required today  3. CAD s/p CABG No ischemic  symptoms Stop imdur due to postural syncope (as above)  4. Syncope Recurrent postural syncope No arrhythmias to explain episodes by PPM interrogation. Likely due to dehydration orthostatics today reveal that she is orthostatic by heart rate. I will obtain Echo to evaluate for structural heart changes Bmet, cbc and tsh today Adequate hydration advised Stop imdur  Risks, benefits and potential toxicities for medications prescribed and/or refilled reviewed with patient today.   Return to see EP PA in a year The importance of compliance with remotes was stressed with her today.  Thompson Grayer MD, [facility] 07/12/2020 3:42 PM

## 2020-07-12 NOTE — Patient Instructions (Signed)
Medication Instructions:  Stop Imdur   Labwork: CBC, BMP, TSH  Testing/Procedures: please schedule echo. .Your physician has requested that you have an echocardiogram. Echocardiography is a painless test that uses sound waves to create images of your heart. It provides your doctor with information about the size and shape of your heart and how well your heart's chambers and valves are working. This procedure takes approximately one hour. There are no restrictions for this procedure.   Follow-Up: Your physician wants you to follow-up in: one year with Thompson Grayer, MD or one of the following Advanced Practice Providers on your designated Care Team:    Chanetta Marshall, NP  Tommye Standard, PA-C  Legrand Como "Jonni Sanger" Chalmers Cater, [place]   Any Other Special Instructions Will Be Listed Below (If Applicable).  If you need a refill on your cardiac medications before your next appointment, please call your pharmacy.

## 2020-07-13 ENCOUNTER — Ambulatory Visit (INDEPENDENT_AMBULATORY_CARE_PROVIDER_SITE_OTHER): Payer: Medicare Other

## 2020-07-13 DIAGNOSIS — I495 Sick sinus syndrome: Secondary | ICD-10-CM | POA: Diagnosis not present

## 2020-07-13 LAB — CBC
Hematocrit: 42.3 % (ref 34.0–46.6)
Hemoglobin: 14.3 g/dL (ref 11.1–15.9)
MCH: 29.2 pg (ref 26.6–33.0)
MCHC: 33.8 g/dL (ref 31.5–35.7)
MCV: 86 fL (ref 79–97)
Platelets: 353 10*3/uL (ref 150–450)
RBC: 4.9 x10E6/uL (ref 3.77–5.28)
RDW: 12 % (ref 11.7–15.4)
WBC: 10.1 10*3/uL (ref 3.4–10.8)

## 2020-07-13 LAB — CUP PACEART REMOTE DEVICE CHECK
Battery Remaining Longevity: 70 mo
Battery Voltage: 3 V
Brady Statistic AP VP Percent: 0.01 %
Brady Statistic AP VS Percent: 2.86 %
Brady Statistic AS VP Percent: 0.07 %
Brady Statistic AS VS Percent: 97.05 %
Brady Statistic RA Percent Paced: 2.87 %
Brady Statistic RV Percent Paced: 0.09 %
Date Time Interrogation Session: 20220110170905
Implantable Lead Implant Date: 20160922
Implantable Lead Implant Date: 20160922
Implantable Lead Location: 753859
Implantable Lead Location: 753860
Implantable Lead Model: 5076
Implantable Lead Model: 5076
Implantable Pulse Generator Implant Date: 20160922
Lead Channel Impedance Value: 1273 Ohm
Lead Channel Impedance Value: 1311 Ohm
Lead Channel Impedance Value: 456 Ohm
Lead Channel Impedance Value: 551 Ohm
Lead Channel Pacing Threshold Amplitude: 0.75 V
Lead Channel Pacing Threshold Amplitude: 1.375 V
Lead Channel Pacing Threshold Pulse Width: 0.4 ms
Lead Channel Pacing Threshold Pulse Width: 0.4 ms
Lead Channel Sensing Intrinsic Amplitude: 10.25 mV
Lead Channel Sensing Intrinsic Amplitude: 12.625 mV
Lead Channel Sensing Intrinsic Amplitude: 3 mV
Lead Channel Sensing Intrinsic Amplitude: 3.125 mV
Lead Channel Setting Pacing Amplitude: 2.5 V
Lead Channel Setting Pacing Amplitude: 2.5 V
Lead Channel Setting Pacing Pulse Width: 0.4 ms
Lead Channel Setting Sensing Sensitivity: 2.8 mV

## 2020-07-13 LAB — BASIC METABOLIC PANEL
BUN/Creatinine Ratio: 24 (ref 12–28)
BUN: 18 mg/dL (ref 8–27)
CO2: 24 mmol/L (ref 20–29)
Calcium: 9.6 mg/dL (ref 8.7–10.3)
Chloride: 97 mmol/L (ref 96–106)
Creatinine, Ser: 0.76 mg/dL (ref 0.57–1.00)
GFR calc Af Amer: 91 mL/min/{1.73_m2} (ref >59)
GFR calc non Af Amer: 79 mL/min/{1.73_m2} (ref >59)
Glucose: 115 mg/dL — ABNORMAL HIGH (ref 65–99)
Potassium: 5.3 mmol/L — ABNORMAL HIGH (ref 3.5–5.2)
Sodium: 135 mmol/L (ref 134–144)

## 2020-07-13 LAB — TSH: TSH: 1.09 u[IU]/mL (ref 0.450–4.500)

## 2020-07-30 ENCOUNTER — Ambulatory Visit (HOSPITAL_COMMUNITY): Payer: Medicare Other | Attending: Cardiology

## 2020-07-30 ENCOUNTER — Other Ambulatory Visit: Payer: Self-pay

## 2020-07-30 DIAGNOSIS — I10 Essential (primary) hypertension: Secondary | ICD-10-CM | POA: Diagnosis not present

## 2020-07-30 DIAGNOSIS — I495 Sick sinus syndrome: Secondary | ICD-10-CM

## 2020-07-30 LAB — ECHOCARDIOGRAM COMPLETE
Area-P 1/2: 2.2 cm2
S' Lateral: 3.3 cm

## 2020-07-30 NOTE — Progress Notes (Signed)
Remote pacemaker transmission.   

## 2020-08-17 ENCOUNTER — Ambulatory Visit (INDEPENDENT_AMBULATORY_CARE_PROVIDER_SITE_OTHER): Payer: Medicare Other | Admitting: Neurology

## 2020-08-17 ENCOUNTER — Other Ambulatory Visit: Payer: Self-pay

## 2020-08-17 ENCOUNTER — Encounter: Payer: Self-pay | Admitting: Neurology

## 2020-08-17 VITALS — BP 140/79 | HR 94 | Ht 69.0 in | Wt 173.0 lb

## 2020-08-17 DIAGNOSIS — R569 Unspecified convulsions: Secondary | ICD-10-CM

## 2020-08-17 DIAGNOSIS — R413 Other amnesia: Secondary | ICD-10-CM | POA: Diagnosis not present

## 2020-08-17 MED ORDER — MEMANTINE HCL 10 MG PO TABS: 10.0000 mg | ORAL_TABLET | Freq: Two times a day (BID) | ORAL | 3 refills | Status: DC

## 2020-08-17 MED ORDER — CARBAMAZEPINE ER 200 MG PO TB12: 600.0000 mg | ORAL_TABLET | Freq: Two times a day (BID) | ORAL | 11 refills | Status: DC

## 2020-08-17 MED ORDER — ETHOSUXIMIDE 250 MG PO CAPS: ORAL_CAPSULE | ORAL | 11 refills | Status: DC

## 2020-08-17 NOTE — Patient Instructions (Signed)
Check labs today  Continue current medications  See you back in 1 year

## 2020-08-17 NOTE — Progress Notes (Signed)
PATIENT: Rachel Clark DOB: [birth year]  REASON FOR VISIT: follow up HISTORY FROM: patient  HISTORY OF PRESENT ILLNESS: Today 08/17/20 Rachel Clark is a 70 year old female with history of seizures and memory disturbance.  She remains on Tegretol XR and Zarotin.  No recurrent seizure, her last seizure was 49 years ago when pregnant with daughter. Today MMSE 27/30, she thinks memory is doing well, husband notices problems with short term memory. Will ask the same questions to her husband through the day. She drives, does grocery shopping, cooking. Very good about sticking to her list. Sleeps well, appetite is fair. Still staying home, feeling isolated. She is hyper cautious about going out. Volunteers once a week at the [employer] [facility] [employer]. Plays scrabble online with friends all over. 4 times last 6 months, has had loss consciousness related to orthostatic hypotension. Sees cardiology, has pacemaker. Drinking a lot of water, standing slowly. Still struggles with depression, increased Cymbalta last year.  Here today for evaluation unaccompanied.  HISTORY 08/18/2019 SS: Rachel Clark is 70 year old female with history of seizures and memory disturbance.  She remains on Tegretol XR and Zarotin.  She has not had recurrent seizure, her last seizure occurred when she was 11.  She has been off Namenda for at least a year.  Her husband reports a decline in her short-term memory.  She does all of her own ADLs, cooking, housework, drives, runs errands well.  She manages her medications without problem.  She sleeps well at night and has a good appetite.  She does feel that she has been more depressed, as she is isolated with Covid, and has not been going out.  Fortunately, she has received her first vaccine.  She does have history of cardiac stent with pacemaker, she was in the ER in January for chest pain, was discharged home.  She presents today for evaluation accompanied by her husband.    REVIEW OF SYSTEMS: Out of a complete 14 system review of symptoms, the patient complains only of the following symptoms, and all other reviewed systems are negative.  See HPI  ALLERGIES: Allergies  Allergen Reactions  . Aspirin Other (See Comments)    GI upset- can tolerate 81 mg ASA, just not full doses  . Erythromycin Nausea Only    Gi upset  . Lisinopril Cough  . Niacin And Related Other (See Comments)    Pt does not recall reactions   . Statins Other (See Comments)    Muscle pain  . Sulfa Drugs Cross Reactors Swelling  . Tetracyclines & Related Nausea Only  . Penicillins Nausea Only and Rash    Has patient had a PCN reaction causing immediate rash, facial/tongue/throat swelling, SOB or lightheadedness with hypotension: Yes Has patient had a PCN reaction causing severe rash involving mucus membranes or skin necrosis: No Has patient had a PCN reaction that required hospitalization No Has patient had a PCN reaction occurring within the last 10 years: No If all of the above answers are "NO", then may proceed with Cephalosporin use.   Marland Kitchen Plavix [Clopidogrel Bisulfate] Rash    HOME MEDICATIONS: Outpatient Medications Prior to Visit  Medication Sig Dispense Refill  . acetaminophen (TYLENOL) 500 MG tablet Take 500-1,000 mg by mouth daily as needed for mild pain or headache.    Marland Kitchen amLODipine (NORVASC) 10 MG tablet Take 1 tablet (10 mg total) by mouth daily. 90 tablet 1  . aspirin 81 MG chewable tablet Chew 1 tablet (81 mg total)  by mouth daily.    . Azelastine HCl 0.15 % SOLN Place 1 spray into both nostrils daily as needed (seasonal allergies).   12  . carbamazepine (TEGRETOL XR) 200 MG 12 hr tablet TAKE 3 TABLETS BY MOUTH TWICE A DAY 180 tablet 5  . carvedilol (COREG) 6.25 MG tablet Take 1 tablet (6.25 mg total) by mouth 2 (two) times daily with a meal. 90 tablet 1  . DULoxetine (CYMBALTA) 60 MG capsule Take 60 mg by mouth daily.     Marland Kitchen estradiol (VIVELLE-DOT) 0.05 MG/24HR patch  Place 1 patch onto the skin 2 (two) times a week. Sunday, Wednesday    . ethosuximide (ZARONTIN) 250 MG capsule Take 2 in the morning, take 2 at night 120 capsule 11  . ezetimibe (ZETIA) 10 MG tablet Take 1 tablet (10 mg total) by mouth daily. OV NEEDED 90 tablet 0  . ibuprofen (ADVIL,MOTRIN) 600 MG tablet Take 1 tablet (600 mg total) by mouth every 8 (eight) hours as needed. (Patient taking differently: Take 600 mg by mouth every 8 (eight) hours as needed for headache or mild pain.) 15 tablet 0  . levothyroxine (SYNTHROID, LEVOTHROID) 50 MCG tablet Take 50 mcg by mouth daily.    . memantine (NAMENDA) 10 MG tablet Take 1 tablet (10 mg total) by mouth 2 (two) times daily. 180 tablet 1  . nitroGLYCERIN (NITROSTAT) 0.4 MG SL tablet Place 1 tablet (0.4 mg total) under the tongue every 5 (five) minutes as needed. For chest pain. 25 tablet 6  . rosuvastatin (CRESTOR) 5 MG tablet Take 1 tablet by mouth up to 3 times weekly as directed 12 tablet 3   No facility-administered medications prior to visit.    PAST MEDICAL HISTORY: Past Medical History:  Diagnosis Date  . Arthritis    "fingers" (06/29/2016)  . Chronic lower back pain   . Colon cancer ([place]) 12/04/2011   s/p Laparoscopic-assisted transverse colectomy on 12/19/2011 by Dr. Donne Hazel.  pT3 N0 M0.   . Coronary artery disease    a. remote NSTEMI with PCI to the RCA; b. S/P CABG in 2007; c. 2010 - 2 v CAD with L-LAD and S-RCA patent, small caliber Dx 70% (treated medically - no amenable to PCI), CFX free of significant disease, normal LVF. d. 10/2014 Lexi MV: EF 61%, no ischemia/infarct.  . Depression   . Dyslipidemia   . Fibromyalgia   . Gastric ulcer   . GERD (gastroesophageal reflux disease)   . 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 Headache    "weekly" (06/29/2016)  . Hyperlipidemia   . Hypothyroid   . Iron deficiency anemia 11/17/2011  . Memory change 11/13/2016  .  Monoclonal gammopathy of unknown significance   . Myocardial infarction ([place]) 2007  . Presence of permanent cardiac pacemaker   . Syncope and collapse    pacemaker implanted  . Unstable angina ([place])     PAST SURGICAL HISTORY: Past Surgical History:  Procedure Laterality Date  . ANTERIOR CERVICAL DECOMP/DISCECTOMY FUSION  1980's  . BACK SURGERY    . CARDIAC CATHETERIZATION  05/20/2009   obstructive native vessel disease in LAD, RCA, and first diagonal, patent vein graft to distal RCA and LIMA to LAD,normal. ef 60%  . CARDIAC CATHETERIZATION N/A 03/24/2015   Procedure: Left Heart Cath and Cors/Grafts Angiography;  Surgeon: Wellington Hampshire, MD;  Location: [facility];  Service: Cardiovascular;  Laterality: N/A;  . CARDIAC  CATHETERIZATION N/A 07/28/2015   Procedure: Left Heart Cath and Coronary Angiography;  Surgeon: Troy Sine, MD;  Location: [facility];  Service: Cardiovascular;  Laterality: N/A;  . CARDIAC CATHETERIZATION N/A 11/09/2015   Procedure: Coronary Stent Intervention;  Surgeon: Sherren Mocha, MD;  Location: [facility];  Service: Cardiovascular;  Laterality: N/A;  . CARDIAC CATHETERIZATION N/A 11/09/2015   Procedure: Left Heart Cath and Coronary Angiography;  Surgeon: Sherren Mocha, MD;  Location: [facility];  Service: Cardiovascular;  Laterality: N/A;  . CARDIAC CATHETERIZATION N/A 06/29/2016   Procedure: Left Heart Cath and Coronary Angiography;  Surgeon: Nelva Bush, MD;  Location: [facility];  Service: Cardiovascular;  Laterality: N/A;  . CARDIAC CATHETERIZATION N/A 06/29/2016   Procedure: Intravascular Pressure Wire/FFR Study;  Surgeon: Nelva Bush, MD;  Location: [facility];  Service: Cardiovascular;  Laterality: N/A;  . CARDIAC CATHETERIZATION N/A 06/29/2016   Procedure: Coronary Balloon Angioplasty;  Surgeon: Nelva Bush, MD;  Location: [facility];  Service: Cardiovascular;  Laterality: N/A;  . COLON  RESECTION  12/19/2011   Procedure: COLON RESECTION LAPAROSCOPIC;  Surgeon: Rolm Bookbinder, MD;  Location: [facility];  Service: General;  Laterality: N/A;  laparoscopic hand assisted partial colon resection  . COLON SURGERY    . COLONOSCOPY    . CORONARY ANGIOPLASTY WITH STENT PLACEMENT  ~ 2007   1 stent  . CORONARY ARTERY BYPASS GRAFT  2007   "CABG X2"  . DILATION AND CURETTAGE OF UTERUS  1973  . EP IMPLANTABLE DEVICE N/A 03/25/2015   MDT Advisa DR pacemaker implanted by Dr Rayann Heman for transient complete heart block and syncope  . INSERT / REPLACE / REMOVE PACEMAKER    . LEFT HEART CATH AND CORS/GRAFTS ANGIOGRAPHY N/A 02/21/2018   Procedure: LEFT HEART CATH AND CORS/GRAFTS ANGIOGRAPHY;  Surgeon: Lorretta Harp, MD;  Location: [facility];  Service: Cardiovascular;  Laterality: N/A;  . PORT-A-CATH REMOVAL N/A 06/12/2013   Procedure: REMOVAL PORT-A-CATH;  Surgeon: Rolm Bookbinder, MD;  Location: [facility];  Service: General;  Laterality: N/A;  . PORTACATH PLACEMENT  06/04/2012   Procedure: INSERTION PORT-A-CATH;  Surgeon: Rolm Bookbinder, MD;  Location: [facility];  Service: General;  Laterality: N/A;  Insertion of port-a-cath   . POSTERIOR LAMINECTOMY / DECOMPRESSION CERVICAL SPINE  1990s  . VAGINAL HYSTERECTOMY      FAMILY HISTORY: Family History  Problem Relation Age of Onset  . Heart attack Father   . Heart disease Father   . Heart attack Brother 73  . Cancer Mother        lung  . Stroke Neg Hx     SOCIAL HISTORY: Social History   Socioeconomic History  . Marital status: Married    Spouse name: Not on file  . Number of children: 2  . Years of education: 1  . Highest education level: Not on file  Occupational History  . Occupation: office work    Fish farm manager: [employer]    Comment: unemployed  Tobacco Use  . Smoking status: Former Smoker    Packs/day: 0.30    Years: 20.00    Pack years: 6.00    Types: Cigarettes    Quit date:  06/06/1981    Years since quitting: 39.2  . Smokeless tobacco: Never Used  Vaping Use  . Vaping Use: Never used  Substance and Sexual Activity  . Alcohol use: Yes    Alcohol/week: 1.0 standard drink  Types: 1 Glasses of wine per week  . Drug use: No  . Sexual activity: Not Currently    Birth control/protection: Surgical  Other Topics Concern  . Not on file  Social History Narrative   Patient is married with 2 children.   Patient is right handed.   Patient has a high school education with some college education.   Patient drinks 2 cups daily.   Social Determinants of Health   Financial Resource Strain: Not on file  Food Insecurity: Not on file  Transportation Needs: Not on file  Physical Activity: Not on file  Stress: Not on file  Social Connections: Not on file  Intimate Partner Violence: Not on file   PHYSICAL EXAM  Vitals:   08/17/20 0957  BP: 140/79  Pulse: 94  Weight: 173 lb (78.5 kg)  Height: 5\' 9"  (1.753 m)   Body mass index is 25.55 kg/m.  Generalized: Well developed, in no acute distress  MMSE - Mini Mental State Exam 08/17/2020 08/18/2019 08/14/2018  Not completed: - (No Data) -  Orientation to time 3 2 4   Orientation to Place 4 3 5   Registration 3 3 3   Attention/ Calculation 5 5 5   Recall 3 1 0  Language- name 2 objects 2 2 2   Language- repeat 1 1 1   Language- follow 3 step command 3 3 3   Language- read & follow direction 1 1 1   Write a sentence 1 1 1   Copy design 1 1 1   Total score 27 23 26     Neurological examination  Mentation: Alert oriented to time, place, history taking. Follows all commands speech and language fluent Cranial nerve II-XII: Pupils were equal round reactive to light. Extraocular movements were full, visual field were full on confrontational test. Facial sensation and strength were normal.  Head turning and shoulder shrug  were normal and symmetric. Motor: The motor testing reveals 5 over 5 strength of all 4 extremities. Good  symmetric motor tone is noted throughout.  Sensory: Sensory testing is intact to soft touch on all 4 extremities. No evidence of extinction is noted.  Coordination: Cerebellar testing reveals good finger-nose-finger and heel-to-shin bilaterally.  Gait and station: Gait is normal. Tandem gait is normal.  Reflexes: Deep tendon reflexes are symmetric and normal bilaterally.   DIAGNOSTIC DATA (LABS, IMAGING, TESTING) - I reviewed patient records, labs, notes, testing and imaging myself where available.  Lab Results  Component Value Date   WBC 10.1 07/12/2020   HGB 14.3 07/12/2020   HCT 42.3 07/12/2020   MCV 86 07/12/2020   PLT 353 07/12/2020      Component Value Date/Time   NA 135 07/12/2020 1646   NA 137 07/11/2016 0820   K 5.3 (H) 07/12/2020 1646   K 4.4 07/11/2016 0820   CL 97 07/12/2020 1646   CL 106 12/25/2012 0924   CO2 24 07/12/2020 1646   CO2 27 07/11/2016 0820   GLUCOSE 115 (H) 07/12/2020 1646   GLUCOSE 114 (H) 01/08/2020 1048   GLUCOSE 115 07/11/2016 0820   GLUCOSE 105 (H) 12/25/2012 0924   BUN 18 07/12/2020 1646   BUN 15.6 07/11/2016 0820   CREATININE 0.76 07/12/2020 1646   CREATININE 0.8 07/11/2016 0820   CALCIUM 9.6 07/12/2020 1646   CALCIUM 8.9 07/11/2016 0820   PROT 8.0 01/08/2020 1048   PROT 7.1 08/18/2019 1453   PROT 7.1 07/11/2016 0820   ALBUMIN 3.9 01/08/2020 1048   ALBUMIN 4.2 08/18/2019 1453   ALBUMIN 3.6 07/11/2016 0820  AST 13 (L) 01/08/2020 1048   AST 13 07/11/2016 0820   ALT 18 01/08/2020 1048   ALT 14 07/11/2016 0820   ALKPHOS 96 01/08/2020 1048   ALKPHOS 89 07/11/2016 0820   BILITOT 0.3 01/08/2020 1048   BILITOT <0.2 08/18/2019 1453   BILITOT 0.29 07/11/2016 0820   GFRNONAA 79 07/12/2020 1646   GFRAA 91 07/12/2020 1646   Lab Results  Component Value Date   CHOL 229 (H) 02/21/2018   HDL 72 02/21/2018   LDLCALC 138 (H) 02/21/2018   TRIG 97 02/21/2018   CHOLHDL 3.2 02/21/2018   Lab Results  Component Value Date   HGBA1C 5.5  02/20/2018   Lab Results  Component Value Date   VITAMINB12 290 11/13/2016   Lab Results  Component Value Date   TSH 1.090 07/12/2020      ASSESSMENT AND PLAN 70 y.o. 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 here with:  1.  Memory disturbance -MMSE 27/30, stable -Continue Namenda 10 mg twice a day -Still remains highly functional, encouraged exercise  2.  Seizures -Seizure-free for 49 years -Check routine labs today, recent CBC was unremarkable -Continue Tegretol XR 200 mg, 3 tablets twice a day -Continue ethosuximide 250 mg, 2 capsules twice daily  3.  Depression -Continue work with PCP for better management  I will see her back in 1 year or sooner if needed.  I spent 30 minutes of face-to-face and non-face-to-face time with patient.  This included previsit chart review, lab review, study review, order entry, electronic health record documentation, patient education.  Butler Denmark, AGNP-C, DNP 08/17/2020, 10:19 AM [facility] [address] [place], [place] [address] [phone]

## 2020-08-17 NOTE — Progress Notes (Signed)
I have read the note, and I agree with the clinical assessment and plan.  Rachel Clark   

## 2020-08-18 LAB — COMPREHENSIVE METABOLIC PANEL
ALT: 16 IU/L (ref 0–32)
AST: 15 IU/L (ref 0–40)
Albumin/Globulin Ratio: 1.4 (ref 1.2–2.2)
Albumin: 4.3 g/dL (ref 3.7–4.7)
Alkaline Phosphatase: 138 IU/L — ABNORMAL HIGH (ref 44–121)
BUN/Creatinine Ratio: 19 (ref 12–28)
BUN: 14 mg/dL (ref 8–27)
Bilirubin Total: <0.2 mg/dL (ref 0.0–1.2)
CO2: 22 mmol/L (ref 20–29)
Calcium: 9.4 mg/dL (ref 8.7–10.3)
Chloride: 95 mmol/L — ABNORMAL LOW (ref 96–106)
Creatinine, Ser: 0.72 mg/dL (ref 0.57–1.00)
GFR calc Af Amer: 97 mL/min/{1.73_m2} (ref >59)
GFR calc non Af Amer: 85 mL/min/{1.73_m2} (ref >59)
Globulin, Total: 3 g/dL (ref 1.5–4.5)
Glucose: 102 mg/dL — ABNORMAL HIGH (ref 65–99)
Potassium: 5.6 mmol/L — ABNORMAL HIGH (ref 3.5–5.2)
Sodium: 134 mmol/L (ref 134–144)
Total Protein: 7.3 g/dL (ref 6.0–8.5)

## 2020-08-18 LAB — ETHOSUXIMIDE LEVEL: Ethosuximide Lvl: 55 ug/mL (ref 40–100)

## 2020-08-18 LAB — CARBAMAZEPINE LEVEL, TOTAL: Carbamazepine (Tegretol), S: 9 ug/mL (ref 4.0–12.0)

## 2020-08-24 ENCOUNTER — Telehealth: Payer: Self-pay

## 2020-08-24 NOTE — Telephone Encounter (Signed)
Pt verified by name and DOB, results given per provider, pt voiced understanding all question answered.  Results faxed to pcp 

## 2020-08-24 NOTE — Telephone Encounter (Signed)
-----  Message from Suzzanne Cloud, NP sent at 08/18/2020  9:55 AM EST ----- Labs show potassium level is elevated 5.6, unclear if hemolysis, but worth rechecking with PCP, was elevated with cardiology in Jan 5.3, mildly elevated alk phos of unknown etiology will follow overtime. Seizure medication drug levels are within normal range. Send labs to PCP, I told her this yesterday but  again recommend she get in with PCP to discuss depression.

## 2020-09-04 ENCOUNTER — Other Ambulatory Visit: Payer: Self-pay

## 2020-09-04 ENCOUNTER — Emergency Department (HOSPITAL_BASED_OUTPATIENT_CLINIC_OR_DEPARTMENT_OTHER): Payer: Medicare Other

## 2020-09-04 ENCOUNTER — Emergency Department (HOSPITAL_BASED_OUTPATIENT_CLINIC_OR_DEPARTMENT_OTHER)
Admission: EM | Admit: 2020-09-04 | Discharge: 2020-09-04 | Disposition: A | Discharge status: Home / Self Care | Payer: Medicare Other | Attending: Emergency Medicine | Admitting: Emergency Medicine

## 2020-09-04 ENCOUNTER — Other Ambulatory Visit: Payer: Self-pay | Admitting: Neurology

## 2020-09-04 ENCOUNTER — Encounter (HOSPITAL_BASED_OUTPATIENT_CLINIC_OR_DEPARTMENT_OTHER): Payer: Self-pay

## 2020-09-04 DIAGNOSIS — I2510 Atherosclerotic heart disease of native coronary artery without angina pectoris: Secondary | ICD-10-CM | POA: Insufficient documentation

## 2020-09-04 DIAGNOSIS — R55 Syncope and collapse: Secondary | ICD-10-CM

## 2020-09-04 DIAGNOSIS — E039 Hypothyroidism, unspecified: Secondary | ICD-10-CM | POA: Diagnosis not present

## 2020-09-04 DIAGNOSIS — Z79899 Other long term (current) drug therapy: Secondary | ICD-10-CM | POA: Insufficient documentation

## 2020-09-04 DIAGNOSIS — Y9301 Activity, walking, marching and hiking: Secondary | ICD-10-CM | POA: Diagnosis not present

## 2020-09-04 DIAGNOSIS — Z955 Presence of coronary angioplasty implant and graft: Secondary | ICD-10-CM | POA: Insufficient documentation

## 2020-09-04 DIAGNOSIS — Z7982 Long term (current) use of aspirin: Secondary | ICD-10-CM | POA: Insufficient documentation

## 2020-09-04 DIAGNOSIS — Z87891 Personal history of nicotine dependence: Secondary | ICD-10-CM | POA: Diagnosis not present

## 2020-09-04 DIAGNOSIS — S0083XA Contusion of other part of head, initial encounter: Secondary | ICD-10-CM | POA: Insufficient documentation

## 2020-09-04 DIAGNOSIS — Z85038 Personal history of other malignant neoplasm of large intestine: Secondary | ICD-10-CM | POA: Diagnosis not present

## 2020-09-04 DIAGNOSIS — I10 Essential (primary) hypertension: Secondary | ICD-10-CM | POA: Diagnosis not present

## 2020-09-04 DIAGNOSIS — Z950 Presence of cardiac pacemaker: Secondary | ICD-10-CM | POA: Diagnosis not present

## 2020-09-04 DIAGNOSIS — E871 Hypo-osmolality and hyponatremia: Secondary | ICD-10-CM | POA: Insufficient documentation

## 2020-09-04 DIAGNOSIS — Z951 Presence of aortocoronary bypass graft: Secondary | ICD-10-CM | POA: Insufficient documentation

## 2020-09-04 DIAGNOSIS — S0990XA Unspecified injury of head, initial encounter: Secondary | ICD-10-CM

## 2020-09-04 DIAGNOSIS — W01198A Fall on same level from slipping, tripping and stumbling with subsequent striking against other object, initial encounter: Secondary | ICD-10-CM | POA: Insufficient documentation

## 2020-09-04 DIAGNOSIS — M79641 Pain in right hand: Secondary | ICD-10-CM | POA: Diagnosis not present

## 2020-09-04 LAB — CBC WITH DIFFERENTIAL/PLATELET
Abs Immature Granulocytes: 0.03 10*3/uL (ref 0.00–0.07)
Basophils Absolute: 0 10*3/uL (ref 0.0–0.1)
Basophils Relative: 0 %
Eosinophils Absolute: 0.1 10*3/uL (ref 0.0–0.5)
Eosinophils Relative: 1 %
HCT: 43.4 % (ref 36.0–46.0)
Hemoglobin: 14.9 g/dL (ref 12.0–15.0)
Immature Granulocytes: 0 %
Lymphocytes Relative: 14 %
Lymphs Abs: 1.4 10*3/uL (ref 0.7–4.0)
MCH: 29.8 pg (ref 26.0–34.0)
MCHC: 34.3 g/dL (ref 30.0–36.0)
MCV: 86.8 fL (ref 80.0–100.0)
Monocytes Absolute: 0.6 10*3/uL (ref 0.1–1.0)
Monocytes Relative: 6 %
Neutro Abs: 7.8 10*3/uL — ABNORMAL HIGH (ref 1.7–7.7)
Neutrophils Relative %: 79 %
Platelets: 342 10*3/uL (ref 150–400)
RBC: 5 MIL/uL (ref 3.87–5.11)
RDW: 12.8 % (ref 11.5–15.5)
WBC: 9.9 10*3/uL (ref 4.0–10.5)
nRBC: 0 % (ref 0.0–0.2)

## 2020-09-04 LAB — COMPREHENSIVE METABOLIC PANEL
ALT: 17 U/L (ref 0–44)
AST: 15 U/L (ref 15–41)
Albumin: 3.9 g/dL (ref 3.5–5.0)
Alkaline Phosphatase: 90 U/L (ref 38–126)
Anion gap: 12 (ref 5–15)
BUN: 15 mg/dL (ref 8–23)
CO2: 25 mmol/L (ref 22–32)
Calcium: 8.9 mg/dL (ref 8.9–10.3)
Chloride: 95 mmol/L — ABNORMAL LOW (ref 98–111)
Creatinine, Ser: 0.72 mg/dL (ref 0.44–1.00)
GFR, Estimated: >60 mL/min (ref >60)
Glucose, Bld: 132 mg/dL — ABNORMAL HIGH (ref 70–99)
Potassium: 3.9 mmol/L (ref 3.5–5.1)
Sodium: 132 mmol/L — ABNORMAL LOW (ref 135–145)
Total Bilirubin: 0.2 mg/dL — ABNORMAL LOW (ref 0.3–1.2)
Total Protein: 7.7 g/dL (ref 6.5–8.1)

## 2020-09-04 LAB — TROPONIN I (HIGH SENSITIVITY)
Troponin I (High Sensitivity): 4 ng/L (ref <18)
Troponin I (High Sensitivity): 13 ng/L (ref <18)

## 2020-09-04 LAB — CARBAMAZEPINE LEVEL, TOTAL: Carbamazepine Lvl: 7.6 ug/mL (ref 4.0–12.0)

## 2020-09-04 NOTE — ED Provider Notes (Signed)
[facility] Provider Note   CSN: [id number] Arrival date & time: 09/04/20  1055     History Chief Complaint  Patient presents with  . Loss of Consciousness    Rachel Clark is a 70 y.o. female.  HPI Patient presents with recurrent episode of syncope.  This is her fifth episode of over the last few months.  Has been seen by cardiology for the same.  She will stand up and feel lightheaded and passed out.  Last 2 episodes have been more of a complete passing out the other 1) lightheaded and falling.  [place] and hit her head.  Hematoma to left forehead.  Denies chest pain.  Reportedly came back to pretty quickly.  Has a pacemaker in place.  Has had the pacemaker interrogated for other episodes and did not show arrhythmia.  Mild swelling in right hand.  No seizure activity although does have history of seizures although last seizure was 49 years ago.  She is on Tegretol.     Past Medical History:  Diagnosis Date  . Arthritis    "fingers" (06/29/2016)  . Chronic lower back pain   . Colon cancer ([place]) 12/04/2011   s/p Laparoscopic-assisted transverse colectomy on 12/19/2011 by Dr. Donne Hazel.  pT3 N0 M0.   . Coronary artery disease    a. remote NSTEMI with PCI to the RCA; b. S/P CABG in 2007; c. 2010 - 2 v CAD with L-LAD and S-RCA patent, small caliber Dx 70% (treated medically - no amenable to PCI), CFX free of significant disease, normal LVF. d. 10/2014 Lexi MV: EF 61%, no ischemia/infarct.  . Depression   . Dyslipidemia   . Fibromyalgia   . Gastric ulcer   . GERD (gastroesophageal reflux disease)   . 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 Headache    "weekly" (06/29/2016)  . Hyperlipidemia   . Hypothyroid   . Iron deficiency anemia 11/17/2011  . Memory change 11/13/2016  . Monoclonal gammopathy of unknown significance   . Myocardial infarction ([place]) 2007  . Presence of permanent  cardiac pacemaker   . Syncope and collapse    pacemaker implanted  . Unstable angina [facility])     Patient Active Problem List   Diagnosis Date Noted  . Memory change 11/13/2016  . Acute bronchitis 03/23/2016  . Chest pain with high risk for cardiac etiology 11/05/2015  . Essential hypertension 08/25/2015  . Sick sinus syndrome ([place]) 08/16/2015  . Syncope and collapse 08/10/2015  . CAD, multiple vessel   . Chest pain   . Cardiac pacemaker in situ 07/25/2015  . Angina pectoris ([place]) 03/24/2015  . Unstable angina ([place]) 10/26/2014  . Seizures ([place]) 02/19/2014  . Lung nodule 02/06/2013  . Thyroid nodule 02/06/2013  . History of colon cancer 12/04/2011  . GERD (gastroesophageal reflux disease) 08/17/2011  . Monoclonal gammopathy of unknown significance   . Hypothyroid   . Coronary artery disease, post CABG 2007    . Dyslipidemia     Past Surgical History:  Procedure Laterality Date  . ANTERIOR CERVICAL DECOMP/DISCECTOMY FUSION  1980's  . BACK SURGERY    . CARDIAC CATHETERIZATION  05/20/2009   obstructive native vessel disease in LAD, RCA, and first diagonal, patent vein graft to distal RCA and LIMA to LAD,normal. ef 60%  . CARDIAC CATHETERIZATION N/A 03/24/2015   Procedure: Left Heart Cath and Cors/Grafts Angiography;  Surgeon: Wellington Hampshire, MD;  Location: [facility];  Service: Cardiovascular;  Laterality: N/A;  . CARDIAC CATHETERIZATION N/A 07/28/2015   Procedure: Left Heart Cath and Coronary Angiography;  Surgeon: Troy Sine, MD;  Location: [place] [facility];  Service: Cardiovascular;  Laterality: N/A;  . CARDIAC CATHETERIZATION N/A 11/09/2015   Procedure: Coronary Stent Intervention;  Surgeon: Sherren Mocha, MD;  Location: [place] [facility];  Service: Cardiovascular;  Laterality: N/A;  . CARDIAC CATHETERIZATION N/A 11/09/2015   Procedure: Left Heart Cath and Coronary Angiography;  Surgeon: Sherren Mocha, MD;  Location: [place] [facility];  Service: Cardiovascular;   Laterality: N/A;  . CARDIAC CATHETERIZATION N/A 06/29/2016   Procedure: Left Heart Cath and Coronary Angiography;  Surgeon: Nelva Bush, MD;  Location: [facility];  Service: Cardiovascular;  Laterality: N/A;  . CARDIAC CATHETERIZATION N/A 06/29/2016   Procedure: Intravascular Pressure Wire/FFR Study;  Surgeon: Nelva Bush, MD;  Location: [facility];  Service: Cardiovascular;  Laterality: N/A;  . CARDIAC CATHETERIZATION N/A 06/29/2016   Procedure: Coronary Balloon Angioplasty;  Surgeon: Nelva Bush, MD;  Location: [place] [facility];  Service: Cardiovascular;  Laterality: N/A;  . COLON RESECTION  12/19/2011   Procedure: COLON RESECTION LAPAROSCOPIC;  Surgeon: Rolm Bookbinder, MD;  Location: [facility];  Service: General;  Laterality: N/A;  laparoscopic hand assisted partial colon resection  . COLON SURGERY    . COLONOSCOPY    . CORONARY ANGIOPLASTY WITH STENT PLACEMENT  ~ 2007   1 stent  . CORONARY ARTERY BYPASS GRAFT  2007   "CABG X2"  . DILATION AND CURETTAGE OF UTERUS  1973  . EP IMPLANTABLE DEVICE N/A 03/25/2015   MDT Advisa DR pacemaker implanted by Dr Rayann Heman for transient complete heart block and syncope  . INSERT / REPLACE / REMOVE PACEMAKER    . LEFT HEART CATH AND CORS/GRAFTS ANGIOGRAPHY N/A 02/21/2018   Procedure: LEFT HEART CATH AND CORS/GRAFTS ANGIOGRAPHY;  Surgeon: Lorretta Harp, MD;  Location: [facility];  Service: Cardiovascular;  Laterality: N/A;  . PORT-A-CATH REMOVAL N/A 06/12/2013   Procedure: REMOVAL PORT-A-CATH;  Surgeon: Rolm Bookbinder, MD;  Location: [facility] [facility];  Service: General;  Laterality: N/A;  . PORTACATH PLACEMENT  06/04/2012   Procedure: INSERTION PORT-A-CATH;  Surgeon: Rolm Bookbinder, MD;  Location: [place] [facility];  Service: General;  Laterality: N/A;  Insertion of port-a-cath   . POSTERIOR LAMINECTOMY / DECOMPRESSION CERVICAL SPINE  1990s  . VAGINAL HYSTERECTOMY       OB History   No obstetric history on  file.     Family History  Problem Relation Age of Onset  . Heart attack Father   . Heart disease Father   . Heart attack Brother 67  . Cancer Mother        lung  . Stroke Neg Hx     Social History   Tobacco Use  . Smoking status: Former Smoker    Packs/day: 0.30    Years: 20.00    Pack years: 6.00    Types: Cigarettes    Quit date: 06/06/1981    Years since quitting: 39.2  . Smokeless tobacco: Never Used  Vaping Use  . Vaping Use: Never used  Substance Use Topics  . Alcohol use: Yes    Alcohol/week: 1.0 standard drink    Types: 1 Glasses of wine per week  . Drug use: No    Home Medications Prior to Admission medications   Medication Sig Start Date End Date Taking? Authorizing Provider  acetaminophen (TYLENOL) 500 MG tablet Take 500-1,000 mg by mouth daily as needed for mild pain or headache.   Yes [provider]  amLODipine (NORVASC) 10 MG tablet Take 1 tablet (10 mg total) by mouth daily. 04/11/18  Yes Duke, Tami Lin, PA  aspirin 81 MG chewable tablet Chew 1 tablet (81 mg total) by mouth daily. 06/30/16  Yes Arbutus Leas, NP  carbamazepine (TEGRETOL XR) 200 MG 12 hr tablet Take 3 tablets (600 mg total) by mouth 2 (two) times daily. 08/17/20  Yes Suzzanne Cloud, NP  carvedilol (COREG) 6.25 MG tablet Take 1 tablet (6.25 mg total) by mouth 2 (two) times daily with a meal. 04/11/18  Yes Duke, Tami Lin, PA  Azelastine HCl 0.15 % SOLN Place 1 spray into both nostrils daily as needed (seasonal allergies).  12/22/14   [provider]  DULoxetine (CYMBALTA) 60 MG capsule Take 60 mg by mouth daily.     [provider]  estradiol (VIVELLE-DOT) 0.05 MG/24HR patch Place 1 patch onto the skin 2 (two) times a week. Sunday, Wednesday    [provider]  ethosuximide (ZARONTIN) 250 MG capsule Take 2 in the morning, take 2 at night 08/17/20   Suzzanne Cloud, NP  ezetimibe (ZETIA) 10 MG tablet Take 1 tablet (10 mg total) by mouth daily. OV  NEEDED 03/11/19   [name], Peter M, MD  ibuprofen (ADVIL,MOTRIN) 600 MG tablet Take 1 tablet (600 mg total) by mouth every 8 (eight) hours as needed. Patient taking differently: Take 600 mg by mouth every 8 (eight) hours as needed for headache or mild pain. 04/07/17   Jola Schmidt, MD  levothyroxine (SYNTHROID, LEVOTHROID) 50 MCG tablet Take 50 mcg by mouth daily.    [provider]  memantine (NAMENDA) 10 MG tablet Take 1 tablet (10 mg total) by mouth 2 (two) times daily. 08/17/20   Suzzanne Cloud, NP  nitroGLYCERIN (NITROSTAT) 0.4 MG SL tablet Place 1 tablet (0.4 mg total) under the tongue every 5 (five) minutes as needed. For chest pain. 02/22/18   Leanor Kail, PA  rosuvastatin (CRESTOR) 5 MG tablet Take 1 tablet by mouth up to 3 times weekly as directed 04/11/18   [name], PA    Allergies    Aspirin, Erythromycin, Lisinopril, Niacin and related, Statins, Sulfa drugs cross reactors, Tetracyclines & related, Penicillins, and Plavix [clopidogrel bisulfate]  Review of Systems   Review of Systems  Constitutional: Negative for appetite change.  HENT: Negative for congestion.   Respiratory: Negative for shortness of breath.   Cardiovascular: Negative for chest pain.  Gastrointestinal: Negative for abdominal pain.  Genitourinary: Negative for flank pain.  Musculoskeletal: Negative for back pain and neck pain.  Skin: Positive for wound.  Neurological: Positive for syncope and light-headedness.  Hematological: Does not bruise/bleed easily.  Psychiatric/Behavioral: Negative for confusion.    Physical Exam Updated Vital Signs BP 135/86   Pulse 78   Temp (!) 97.4 F (36.3 C) (Oral)   Resp 16   Ht 5\' 9"  (1.753 m)   Wt 78.5 kg   SpO2 98%   BMI 25.56 kg/m   Physical Exam Vitals and nursing note reviewed.  HENT:     Head: Normocephalic.     Comments: Moderate-sized hematoma left anterior forehead.    Right Ear: External ear normal.     Left Ear: External ear  normal.     Mouth/Throat:     Mouth: Mucous membranes are moist.  Eyes:  Pupils: Pupils are equal, round, and reactive to light.  Neck:     Comments: No midline tenderness. Pulmonary:     Breath sounds: No wheezing or rhonchi.  Abdominal:     Tenderness: There is no abdominal tenderness.  Musculoskeletal:     Cervical back: Neck supple.     Comments: Tenderness over right first MCP joint.  Also joint swelling.  No tenderness over wrist elbow or shoulder.  Abrasion over the left MCP area without underlying bony tenderness.  Skin:    General: Skin is warm.     Capillary Refill: Capillary refill takes less than 2 seconds.  Neurological:     Mental Status: She is alert and oriented to person, place, and time.  Psychiatric:        Mood and Affect: Mood normal.     ED Results / Procedures / Treatments   Labs (all labs ordered are listed, but only abnormal results are displayed) Labs Reviewed  COMPREHENSIVE METABOLIC PANEL - Abnormal; Notable for the following components:      Result Value   Sodium 132 (*)    Chloride 95 (*)    Glucose, Bld 132 (*)    Total Bilirubin 0.2 (*)    All other components within normal limits  CBC WITH DIFFERENTIAL/PLATELET - Abnormal; Notable for the following components:   Neutro Abs 7.8 (*)    All other components within normal limits  CARBAMAZEPINE LEVEL, TOTAL  TROPONIN I (HIGH SENSITIVITY)  TROPONIN I (HIGH SENSITIVITY)    EKG None  Radiology CT Head Wo Contrast  Result Date: 09/04/2020 CLINICAL DATA:  Syncope EXAM: CT HEAD WITHOUT CONTRAST TECHNIQUE: Contiguous axial images were obtained from the base of the skull through the vertex without intravenous contrast. COMPARISON:  Nov 17, 2016 FINDINGS: Brain: No evidence of acute infarction, hemorrhage, hydrocephalus, extra-axial collection or mass lesion/mass effect. Periventricular white matter hypodensities consistent with sequela of chronic microvascular ischemic disease. Vascular: Vascular  calcifications. Skull: Normal. Negative for fracture or focal lesion. Sinuses/Orbits: No acute finding. Other: LEFT forehead subcutaneous hematoma. IMPRESSION: 1. No acute intracranial abnormality. 2. LEFT forehead subcutaneous hematoma. Electronically Signed   By: Valentino Saxon MD   On: 09/04/2020 11:54   DG Hand Complete Right  Result Date: 09/04/2020 CLINICAL DATA:  Pain after fall. EXAM: RIGHT HAND - COMPLETE 3+ VIEW COMPARISON:  None. FINDINGS: There is no evidence of fracture or dislocation. There is no evidence of arthropathy or other focal bone abnormality. Soft tissues are unremarkable. IMPRESSION: Negative. Electronically Signed   By: Dorise Bullion III M.D   On: 09/04/2020 11:50    Procedures Procedures   Medications Ordered in ED Medications - No data to display  ED Course  I have reviewed the triage vital signs and the nursing notes.  Pertinent labs & imaging results that were available during my care of the patient were reviewed by me and considered in my medical decision making (see chart for details).    MDM Rules/Calculators/A&P                          Patient with syncopal episode.  History of same.  Thought to be orthostatic after seeing cardiology previously.  Had another episode today but hit her forehead.  Has decent sized hematoma which during stay did spread down to her upper lid.  Not on anticoagulation.  Pacemaker interrogated and no acute abnormality today.  Was mildly orthostatic and of the pressure dropped although  patient did not feel dizzy.  Initial hypertension upon arrival is now improved.  Mild hyponatremia.  Patient states she is on a low sodium diet.  Patient will take a little higher sodium today.  Can follow with her cardiologist about potential further adjustments of medication.  Has had previous seizures but this does not appear as if this is seizure.  Discharge home. Final Clinical Impression(s) / ED Diagnoses Final diagnoses:  Syncope,  unspecified syncope type  Injury of head, initial encounter  Traumatic hematoma of forehead, initial encounter    Rx / DC Orders ED Discharge Orders    None       Davonna Belling, MD 09/04/20 [phone]

## 2020-09-04 NOTE — ED Notes (Signed)
Pt transported to CT at this time.

## 2020-09-04 NOTE — Discharge Instructions (Signed)
Take care changing positions.  Follow-up with cardiology for potential further adjustment of your medications.

## 2020-09-04 NOTE — ED Triage Notes (Signed)
Pt arrives after a syncopal episode. States she stood up from bed and started walking and passed out. Has happened 5 times in the last 4 months. Has seen cardiologist. Arrives with large hematoma to left forehead, abrasion to left hand and swelling to right knuckle. Denies thinners, takes 81mg  ASA daily. Had 2 tylenol pta

## 2020-09-04 NOTE — ED Notes (Signed)
Orthostatic Vitals: Lying:  183/77   HR: 77 Sitting: 165/83 HR 90 Standing: 127/82 HR 99

## 2020-09-06 ENCOUNTER — Telehealth: Payer: Self-pay | Admitting: Internal Medicine

## 2020-09-06 NOTE — Telephone Encounter (Signed)
New Message:     Pt's husband is calling to let Dr Rayann Heman know that pt was seen at [facility] on 09-04-20 for Syncope. He would like for Dr Rayann Heman to review the notes from that visit and let her know if she needs to be seen by him. She does have an appt on 09-10-20 with Dr [name].

## 2020-09-06 NOTE — Progress Notes (Signed)
Cardiology Office Note   Date:  09/10/2020   ID:  Rachel Clark, DOB [birth year], MRN [id number]  PCP:  Harlan Stains, MD  Cardiologist:   Izabela Ow [name], MD   Chief Complaint  Patient presents with  . Loss of Consciousness      History of Present Illness: Rachel Clark is a 70 y.o. 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 Subsequent failure of SVG to RCA. S/p extensive stenting of RCA in the past- last intervention in 2017 with PTCA of ostial RCA for restenosis. In August 2019 stented RCA was widely patent.   Since November she has had multiple episodes of dizziness and syncope. She was found to be orthostatic. Seen in our clinic and later by Dr Rayann Heman. Pacemaker function normal. Imdur was discontinued. Hydration seemed to improve symptoms. Echo was unremarkable.   Was seen in the ED on 09/04/20 with recurrent syncope after standing. She was orthostatic. Labs were unremarkable except mildly decreased sodium. No changes made at that time.   She has had no further episodes since her ED visit. No warning. States sometimes she can feel it coming on. No angina. Does not monitor BP at home.     Past Medical History:  Diagnosis Date  . Arthritis    "fingers" (06/29/2016)  . Chronic lower back pain   . Colon cancer ([place]) 12/04/2011   s/p Laparoscopic-assisted transverse colectomy on 12/19/2011 by Dr. Donne Hazel.  pT3 N0 M0.   . Coronary artery disease    a. remote NSTEMI with PCI to the RCA; b. S/P CABG in 2007; c. 2010 - 2 v CAD with L-LAD and S-RCA patent, small caliber Dx 70% (treated medically - no amenable to PCI), CFX free of significant disease, normal LVF. d. 10/2014 Lexi MV: EF 61%, no ischemia/infarct.  . Depression   . Dyslipidemia   . Fibromyalgia   . Gastric ulcer   . GERD (gastroesophageal reflux disease)   . 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 Headache    "weekly" (06/29/2016)  . Hyperlipidemia   . Hypothyroid   . Iron deficiency anemia 11/17/2011  . Memory change 11/13/2016  . Monoclonal gammopathy of unknown significance   . Myocardial infarction ([place]) 2007  . Presence of permanent cardiac pacemaker   . Syncope and collapse    pacemaker implanted  . Unstable angina [facility])     Past Surgical History:  Procedure Laterality Date  . ANTERIOR CERVICAL DECOMP/DISCECTOMY FUSION  1980's  . BACK SURGERY    . CARDIAC CATHETERIZATION  05/20/2009   obstructive native vessel disease in LAD, RCA, and first diagonal, patent vein graft to distal RCA and LIMA to LAD,normal. ef 60%  . CARDIAC CATHETERIZATION N/A 03/24/2015   Procedure: Left Heart Cath and Cors/Grafts Angiography;  Surgeon: Wellington Hampshire, MD;  Location: [facility];  Service: Cardiovascular;  Laterality: N/A;  . CARDIAC CATHETERIZATION N/A 07/28/2015   Procedure: Left Heart Cath and Coronary Angiography;  Surgeon: Troy Sine, MD;  Location: [place];  Service: Cardiovascular;  Laterality: N/A;  . CARDIAC CATHETERIZATION N/A 11/09/2015   Procedure: Coronary Stent Intervention;  Surgeon: Sherren Mocha, MD;  Location: [place] [facility];  Service: Cardiovascular;  Laterality: N/A;  . CARDIAC CATHETERIZATION N/A 11/09/2015   Procedure: Left Heart  Cath and Coronary Angiography;  Surgeon: Sherren Mocha, MD;  Location: [place] [facility];  Service: Cardiovascular;  Laterality: N/A;  . CARDIAC CATHETERIZATION N/A 06/29/2016   Procedure: Left Heart Cath and Coronary Angiography;  Surgeon: Nelva Bush, MD;  Location: [place] [facility];  Service: Cardiovascular;  Laterality: N/A;  . CARDIAC CATHETERIZATION N/A 06/29/2016   Procedure: Intravascular Pressure Wire/FFR Study;  Surgeon: Nelva Bush, MD;  Location: [place] [facility];  Service: Cardiovascular;   Laterality: N/A;  . CARDIAC CATHETERIZATION N/A 06/29/2016   Procedure: Coronary Balloon Angioplasty;  Surgeon: Nelva Bush, MD;  Location: [place] [facility];  Service: Cardiovascular;  Laterality: N/A;  . COLON RESECTION  12/19/2011   Procedure: COLON RESECTION LAPAROSCOPIC;  Surgeon: Rolm Bookbinder, MD;  Location: [facility];  Service: General;  Laterality: N/A;  laparoscopic hand assisted partial colon resection  . COLON SURGERY    . COLONOSCOPY    . CORONARY ANGIOPLASTY WITH STENT PLACEMENT  ~ 2007   1 stent  . CORONARY ARTERY BYPASS GRAFT  2007   "CABG X2"  . DILATION AND CURETTAGE OF UTERUS  1973  . EP IMPLANTABLE DEVICE N/A 03/25/2015   MDT Advisa DR pacemaker implanted by Dr Rayann Heman for transient complete heart block and syncope  . INSERT / REPLACE / REMOVE PACEMAKER    . LEFT HEART CATH AND CORS/GRAFTS ANGIOGRAPHY N/A 02/21/2018   Procedure: LEFT HEART CATH AND CORS/GRAFTS ANGIOGRAPHY;  Surgeon: Lorretta Harp, MD;  Location: [facility];  Service: Cardiovascular;  Laterality: N/A;  . PORT-A-CATH REMOVAL N/A 06/12/2013   Procedure: REMOVAL PORT-A-CATH;  Surgeon: Rolm Bookbinder, MD;  Location: [facility];  Service: General;  Laterality: N/A;  . PORTACATH PLACEMENT  06/04/2012   Procedure: INSERTION PORT-A-CATH;  Surgeon: Rolm Bookbinder, MD;  Location: [facility];  Service: General;  Laterality: N/A;  Insertion of port-a-cath   . POSTERIOR LAMINECTOMY / DECOMPRESSION CERVICAL SPINE  1990s  . VAGINAL HYSTERECTOMY       Current Outpatient Medications  Medication Sig Dispense Refill  . acetaminophen (TYLENOL) 500 MG tablet Take 500-1,000 mg by mouth daily as needed for mild pain or headache.    Marland Kitchen aspirin 81 MG chewable tablet Chew 1 tablet (81 mg total) by mouth daily.    . Azelastine HCl 0.15 % SOLN Place 1 spray into both nostrils daily as needed (seasonal allergies).   12  . carbamazepine (TEGRETOL XR) 200 MG 12 hr tablet Take 3 tablets (600 mg total) by  mouth 2 (two) times daily. 180 tablet 11  . DULoxetine (CYMBALTA) 60 MG capsule Take 60 mg by mouth daily.     Marland Kitchen estradiol (VIVELLE-DOT) 0.05 MG/24HR patch Place 1 patch onto the skin 2 (two) times a week. Sunday, Wednesday    . ethosuximide (ZARONTIN) 250 MG capsule Take 2 in the morning, take 2 at night 120 capsule 11  . ezetimibe (ZETIA) 10 MG tablet Take 1 tablet (10 mg total) by mouth daily. OV NEEDED 90 tablet 0  . ibuprofen (ADVIL,MOTRIN) 600 MG tablet Take 1 tablet (600 mg total) by mouth every 8 (eight) hours as needed. (Patient taking differently: Take 600 mg by mouth every 8 (eight) hours as needed for headache or mild pain.) 15 tablet 0  . levothyroxine (SYNTHROID, LEVOTHROID) 50 MCG tablet Take 50 mcg by mouth daily.    . memantine (NAMENDA) 10 MG tablet Take 1 tablet (10 mg total) by mouth 2 (two) times daily. 180 tablet 3  . nitroGLYCERIN (  NITROSTAT) 0.4 MG SL tablet Place 1 tablet (0.4 mg total) under the tongue every 5 (five) minutes as needed. For chest pain. 25 tablet 6  . rosuvastatin (CRESTOR) 5 MG tablet Take 1 tablet by mouth up to 3 times weekly as directed 12 tablet 3   No current facility-administered medications for this visit.    Allergies:   Aspirin, Erythromycin, Lisinopril, Niacin and related, Statins, Sulfa drugs cross reactors, Tetracyclines & related, Penicillins, and Plavix [clopidogrel bisulfate]    Social History:  The patient  reports that she quit smoking about 39 years ago. Her smoking use included cigarettes. She has a 6.00 pack-year smoking history. She has never used smokeless tobacco. She reports current alcohol use of about 1.0 standard drink of alcohol per week. She reports that she does not use drugs.   Family History:  The patient's family history includes Cancer in her mother; Heart attack in her father; Heart attack (age of onset: 104) in her brother; Heart disease in her father.    ROS:  Please see the history of present illness.   Otherwise,  review of systems are positive for none.   All other systems are reviewed and negative.    PHYSICAL EXAM: VS:  BP 140/74   Pulse 99   Ht 5\' 9"  (1.753 m)   Wt 174 lb (78.9 kg)   SpO2 97%   BMI 25.70 kg/m  , BMI Body mass index is 25.7 kg/m.   BP 140/70 supine and sitting. Drops to 124/60 with standing. Pulse increases from 88 to 104.  GEN: Well nourished, well developed, in no acute distress  HEENT: normal  Neck: no JVD, carotid bruits, or masses Cardiac: RRR; no murmurs, rubs, or gallops,no edema  Respiratory:  clear to auscultation bilaterally, normal work of breathing GI: soft, nontender, nondistended, + BS MS: no deformity or atrophy  Skin: warm and dry, no rash Neuro:  Strength and sensation are intact Psych: euthymic mood, full affect   EKG:  EKG is not ordered today. The ekg ordered today demonstrates N/A   Recent Labs: 07/12/2020: TSH 1.090 09/04/2020: ALT 17; BUN 15; Creatinine, Ser 0.72; Hemoglobin 14.9; Platelets 342; Potassium 3.9; Sodium 132    Lipid Panel    Component Value Date/Time   CHOL 229 (H) 02/21/2018 0254   TRIG 97 02/21/2018 0254   HDL 72 02/21/2018 0254   CHOLHDL 3.2 02/21/2018 0254   VLDL 19 02/21/2018 0254   LDLCALC 138 (H) 02/21/2018 0254   Dated 09/03/19: cholesterol 283, triglycerides 238, HDL 73, LDL 166, CMET  Normal.    Wt Readings from Last 3 Encounters:  09/10/20 174 lb (78.9 kg)  09/04/20 173 lb 1 oz (78.5 kg)  08/17/20 173 lb (78.5 kg)      Other studies Reviewed: Additional studies/ records that were reviewed today include:   Cardiac cath 02/21/18:  LEFT HEART CATH AND CORS/GRAFTS ANGIOGRAPHY    Conclusion    Previously placed Ost RCA to Mid RCA stent (unknown type) is widely patent.  Prox LAD lesion is 100% stenosed.  Origin lesion is 100% stenosed.  The left ventricular systolic function is normal.  LV end diastolic pressure is normal.  The left ventricular ejection fraction is 55-65% by visual estimate.      Echo 07/30/20: IMPRESSIONS    1. Left ventricular ejection fraction, by estimation, is 60 to 65%. The  left ventricle has normal function. The left ventricle has no regional  wall motion abnormalities. Left ventricular diastolic parameters are  consistent with Grade I diastolic  dysfunction (impaired relaxation).  2. Right ventricular systolic function is normal. The right ventricular  size is normal.  3. The mitral valve is normal in structure. Trivial mitral valve  regurgitation. No evidence of mitral stenosis.  4. The aortic valve is tricuspid. Aortic valve regurgitation is not  visualized. Mild aortic valve sclerosis is present, with no evidence of  aortic valve stenosis.  5. The inferior vena cava is normal in size with greater than 50%  respiratory variability, suggesting right atrial pressure of 3 mmHg.   Comparison(s): Prior images unable to be directly viewed, comparison made  by report only. No significant change from prior study. 07/26/15 EF 60-65%.  GLS -18.6%.    ASSESSMENT AND PLAN:  1.  Syncope. This appears to be related to orthostatic hypotension/and/or neurocardiogenic syncope. Pacer evaluation has been normal. Recommend liberalization of sodium in diet. Good hydration. Need to allow more permissive BP. Will reduce Coreg to 3.125 mg bid and reduce amlodipine to 5 mg daily. Compression hose. Will follow up in 3 months. May need to reduce medication further if symptoms continue. Encouraged her to get a home BP monitor and record BP at home 2. CAD. S/p CABG. S/p extensive stenting of RCA. Last in 2017. Cath in 2019 was OK. No angina 3. Hyperlipidemia. No recent labs. Will check fasting lab on next visit. On low dose crestor. May need to consider PCSK 9 inhibitor.  4. SSS. S/p pacemaker. Followed in device clinic.  5. Memory loss. 6. History of seizures.    Current medicines are reviewed at length with the patient today.  The patient does not have concerns  regarding medicines.  The following changes have been made:  See above.  Labs/ tests ordered today include: none No orders of the defined types were placed in this encounter.    Disposition:   FU with me in 3 months with fasting lab.  Signed, Talaysha Freeberg [name], MD  09/10/2020 10:05 AM    [facility] [address], [place], [place], [address] Phone [phone], Fax [phone]

## 2020-09-06 NOTE — Telephone Encounter (Signed)
Returned call to patient's husband who states patient is recovering today from her fall yesterday and has facial bruising. She had a fall yesterday and was seen in ED at [facility], similar to the other 5 falls that have occurred recently. Pt was seen by Dr. Rayann Heman on 07/12/20 for syncope and isosorbide was d/c'ed. Labs ordered on that day were wnl. She had an echo on 1/28 which showed normal LVEF, G1DD and normal valve function. Husband reports pt has little to no warning that she is going to pass out and she frequently has injuries to her face and body. Does not measure BP/HR at home - encouraged husband to get a cuff for home use. Husband states BP at medical appointments has always been normal. She did have vital signs yesterday representative of orthostatic hypotension. He reports he is encouraging her to drink more and will add a small Gatorade daily due to her low Na+ level on lab work done yesterday.  Denies new medications or other recent changes; has taken Namenda for about 3 years. She has an appointment with Dr. [name] on 3/11. I advised that I will send message to Dr. Rayann Heman for review and that if he has any additional advice, someone from our office will call them back. Patient's husband thanked me for the call.

## 2020-09-09 NOTE — Telephone Encounter (Signed)
Dr. Rayann Heman aware and agreement with plan and follow up.

## 2020-09-10 ENCOUNTER — Ambulatory Visit (INDEPENDENT_AMBULATORY_CARE_PROVIDER_SITE_OTHER): Payer: Medicare Other | Admitting: Cardiology

## 2020-09-10 ENCOUNTER — Other Ambulatory Visit: Payer: Self-pay

## 2020-09-10 ENCOUNTER — Encounter: Payer: Self-pay | Admitting: Cardiology

## 2020-09-10 VITALS — BP 140/74 | HR 99 | Ht 69.0 in | Wt 174.0 lb

## 2020-09-10 DIAGNOSIS — R55 Syncope and collapse: Secondary | ICD-10-CM | POA: Diagnosis not present

## 2020-09-10 DIAGNOSIS — I951 Orthostatic hypotension: Secondary | ICD-10-CM

## 2020-09-10 DIAGNOSIS — I10 Essential (primary) hypertension: Secondary | ICD-10-CM | POA: Diagnosis not present

## 2020-09-10 DIAGNOSIS — I495 Sick sinus syndrome: Secondary | ICD-10-CM | POA: Diagnosis not present

## 2020-09-10 DIAGNOSIS — I2510 Atherosclerotic heart disease of native coronary artery without angina pectoris: Secondary | ICD-10-CM

## 2020-09-10 MED ORDER — ROSUVASTATIN CALCIUM 5 MG PO TABS: ORAL_TABLET | ORAL | 3 refills | Status: DC

## 2020-09-10 MED ORDER — CARVEDILOL 3.125 MG PO TABS: 3.1250 mg | ORAL_TABLET | Freq: Two times a day (BID) | ORAL | 3 refills | Status: DC

## 2020-09-10 MED ORDER — NITROGLYCERIN 0.4 MG SL SUBL: 0.4000 mg | SUBLINGUAL_TABLET | SUBLINGUAL | 11 refills | Status: DC | PRN

## 2020-09-10 MED ORDER — AMLODIPINE BESYLATE 5 MG PO TABS: 5.0000 mg | ORAL_TABLET | Freq: Every day | ORAL | 3 refills | Status: DC

## 2020-09-10 MED ORDER — EZETIMIBE 10 MG PO TABS: 10.0000 mg | ORAL_TABLET | Freq: Every day | ORAL | 3 refills | Status: DC

## 2020-09-10 NOTE — Patient Instructions (Signed)
Reduce amlodipine to 5 mg daily  Reduce Coreg to 3.125 mg twice a day  Liberalize your salt intake.  Stay hydrated  Consider wearing compression hose.  Will follow up in 3 months

## 2020-09-10 NOTE — Addendum Note (Signed)
Addended by: Kathyrn Lass on: 09/10/2020 10:21 AM   Modules accepted: Orders

## 2020-09-30 ENCOUNTER — Telehealth: Payer: Self-pay | Admitting: Cardiology

## 2020-09-30 DIAGNOSIS — E875 Hyperkalemia: Secondary | ICD-10-CM | POA: Diagnosis not present

## 2020-09-30 DIAGNOSIS — F331 Major depressive disorder, recurrent, moderate: Secondary | ICD-10-CM | POA: Diagnosis not present

## 2020-09-30 NOTE — Telephone Encounter (Signed)
Attempted to call patient, left message for patient to call back to office.   

## 2020-09-30 NOTE — Telephone Encounter (Signed)
Called patient left message on personal voice mail to call back. 

## 2020-09-30 NOTE — Telephone Encounter (Signed)
Reduce amlodipine to 2.5 mg daily  Tynia Wiers [name] MD, [facility]

## 2020-09-30 NOTE — Telephone Encounter (Signed)
Spoke with patients husband who states that patient was seen today by her PCP and her blood pressure was 115/70 around 9am, patients husband reports that they went to lunch and got coffee, and then a hair cut and returned home to finish their coffee. Patients husband states that while getting up from the couch to walk to their bedroom patient fell in the floor. Patients husband states that patient did not lose consciousness and did not hit her head. Patients husband states that after falling he checked patients blood pressure and it was 99/57 with HR 86. Patients husband states that he gave patient a glass of water to drink and then helped her to bed to lay down for a while.   Patients husband states that patient has been seen multiple times here recently for these same issues. Patients husband states that patient does have some cognitive issues and he is unsure if patient is adequately hydrated. Patients husband states that patient is not wearing compression stockings.   Spoke with patient who states that she did get dizzy and then fall but did not pass out. Patient denies any injuries besides a scrape on her knuckle that was bleeding. Patient states that she has been intermittently dizzy the last several weeks.   Advised patients husband to increase patients hydration, and monitor blood pressure and any symptoms. Advised patient and patients husband of ED precautions should symptoms worsen or new symptoms develop. Advised patients husband I would forward message to Dr. [name] for advice. Patients husband verbalized understanding.

## 2020-09-30 NOTE — Telephone Encounter (Signed)
Pt c/o BP issue: STAT if pt c/o blurred vision, one-sided weakness or slurred speech  1. What are your last 5 BP readings?  113/70 Hr 86 in her PCP this morning  99/57 Hr 86 After they got home and pt fell  2. Are you having any other symptoms (ex. Dizziness, headache, blurred vision, passed out)? Dizziness lightheadness   3. What is your BP issue? Pt husband called in and stated they seen the her PCP this morning and her BP was 113/70 hr 86.  They left the appt went to [employer] to eat and pt got a haircut.  They then returned home , after being home pt was going back to take a nap and fell walking down the hallway,  Husband then took her bp and it was 99/57 hr 86.  Pt drunk some water got in be and is now napping .  Best number [phone]

## 2020-10-01 NOTE — Telephone Encounter (Signed)
Left message for pt to call.

## 2020-10-04 NOTE — Telephone Encounter (Signed)
Returned call to patient no answer.Left message on personal voice mail to call back. 

## 2020-10-05 NOTE — Telephone Encounter (Signed)
Called patient no answer.Left message on personal voice mail to call back. 

## 2020-10-05 NOTE — Telephone Encounter (Signed)
Patient never returned call  

## 2020-10-11 ENCOUNTER — Ambulatory Visit (INDEPENDENT_AMBULATORY_CARE_PROVIDER_SITE_OTHER): Payer: Medicare Other

## 2020-10-11 DIAGNOSIS — I495 Sick sinus syndrome: Secondary | ICD-10-CM | POA: Diagnosis not present

## 2020-10-12 ENCOUNTER — Telehealth: Payer: Self-pay | Admitting: Cardiology

## 2020-10-12 NOTE — Telephone Encounter (Signed)
Attempted to call patient, left message for patient to call back to office.   

## 2020-10-12 NOTE — Telephone Encounter (Signed)
Pt c/o BP issue: STAT if pt c/o blurred vision, one-sided weakness or slurred speech  1. What are your last 5 BP readings? Currently 123/63 HR 75  2. Are you having any other symptoms (ex. Dizziness, headache, blurred vision, passed out)?very little  lightheaded  3. What is your BP issue? Lightheadedness,  Pt wants to know if salt tablets can be added to her meals to bring her sodium intake up. Pacemaker was just read on 10/22/2020  Monday 114/67 HR82 Monday Evening 139/71 HR 78

## 2020-10-13 LAB — CUP PACEART REMOTE DEVICE CHECK
Battery Remaining Longevity: 72 mo
Battery Voltage: 3 V
Brady Statistic AP VP Percent: 0.01 %
Brady Statistic AP VS Percent: 0.39 %
Brady Statistic AS VP Percent: 0.09 %
Brady Statistic AS VS Percent: 99.52 %
Brady Statistic RA Percent Paced: 0.4 %
Brady Statistic RV Percent Paced: 0.09 %
Date Time Interrogation Session: 20220411123220
Implantable Lead Implant Date: 20160922
Implantable Lead Implant Date: 20160922
Implantable Lead Location: 753859
Implantable Lead Location: 753860
Implantable Lead Model: 5076
Implantable Lead Model: 5076
Implantable Pulse Generator Implant Date: 20160922
Lead Channel Impedance Value: 1330 Ohm
Lead Channel Impedance Value: 1349 Ohm
Lead Channel Impedance Value: 418 Ohm
Lead Channel Impedance Value: 475 Ohm
Lead Channel Pacing Threshold Amplitude: 0.875 V
Lead Channel Pacing Threshold Amplitude: 1 V
Lead Channel Pacing Threshold Pulse Width: 0.4 ms
Lead Channel Pacing Threshold Pulse Width: 0.4 ms
Lead Channel Sensing Intrinsic Amplitude: 4.25 mV
Lead Channel Sensing Intrinsic Amplitude: 4.25 mV
Lead Channel Sensing Intrinsic Amplitude: 8.875 mV
Lead Channel Sensing Intrinsic Amplitude: 8.875 mV
Lead Channel Setting Pacing Amplitude: 2.5 V
Lead Channel Setting Pacing Amplitude: 2.5 V
Lead Channel Setting Pacing Pulse Width: 0.4 ms
Lead Channel Setting Sensing Sensitivity: 2.8 mV

## 2020-10-15 NOTE — Telephone Encounter (Signed)
Attempted to call patient. Left message to call back if assistance still needed

## 2020-10-25 NOTE — Progress Notes (Signed)
Remote pacemaker transmission.   

## 2020-10-27 ENCOUNTER — Other Ambulatory Visit: Payer: Self-pay | Admitting: Neurology

## 2020-11-09 ENCOUNTER — Telehealth: Payer: Self-pay | Admitting: Neurology

## 2020-11-09 NOTE — Telephone Encounter (Signed)
LMVM for husband to return call regarding message left.

## 2020-11-09 NOTE — Telephone Encounter (Addendum)
Pt's husband called wanting to know if the provider here can refer her to another provider in this office that specializes in memory loss or does he really need to go to her PCP. Please advise.

## 2020-11-10 ENCOUNTER — Encounter (HOSPITAL_BASED_OUTPATIENT_CLINIC_OR_DEPARTMENT_OTHER): Payer: Self-pay

## 2020-11-10 ENCOUNTER — Emergency Department (HOSPITAL_BASED_OUTPATIENT_CLINIC_OR_DEPARTMENT_OTHER)
Admission: EM | Admit: 2020-11-10 | Discharge: 2020-11-10 | Disposition: A | Discharge status: Home / Self Care | Payer: Medicare Other | Attending: Emergency Medicine | Admitting: Emergency Medicine

## 2020-11-10 ENCOUNTER — Emergency Department (HOSPITAL_BASED_OUTPATIENT_CLINIC_OR_DEPARTMENT_OTHER): Payer: Medicare Other

## 2020-11-10 ENCOUNTER — Other Ambulatory Visit: Payer: Self-pay

## 2020-11-10 DIAGNOSIS — I10 Essential (primary) hypertension: Secondary | ICD-10-CM | POA: Diagnosis not present

## 2020-11-10 DIAGNOSIS — Z87891 Personal history of nicotine dependence: Secondary | ICD-10-CM | POA: Insufficient documentation

## 2020-11-10 DIAGNOSIS — R0789 Other chest pain: Secondary | ICD-10-CM | POA: Diagnosis not present

## 2020-11-10 DIAGNOSIS — W19XXXA Unspecified fall, initial encounter: Secondary | ICD-10-CM | POA: Diagnosis not present

## 2020-11-10 DIAGNOSIS — E039 Hypothyroidism, unspecified: Secondary | ICD-10-CM | POA: Diagnosis not present

## 2020-11-10 DIAGNOSIS — I2510 Atherosclerotic heart disease of native coronary artery without angina pectoris: Secondary | ICD-10-CM | POA: Insufficient documentation

## 2020-11-10 DIAGNOSIS — Z85038 Personal history of other malignant neoplasm of large intestine: Secondary | ICD-10-CM | POA: Insufficient documentation

## 2020-11-10 DIAGNOSIS — Z950 Presence of cardiac pacemaker: Secondary | ICD-10-CM | POA: Insufficient documentation

## 2020-11-10 DIAGNOSIS — R55 Syncope and collapse: Secondary | ICD-10-CM | POA: Diagnosis not present

## 2020-11-10 DIAGNOSIS — Z951 Presence of aortocoronary bypass graft: Secondary | ICD-10-CM | POA: Diagnosis not present

## 2020-11-10 DIAGNOSIS — Z79899 Other long term (current) drug therapy: Secondary | ICD-10-CM | POA: Diagnosis not present

## 2020-11-10 DIAGNOSIS — Z7982 Long term (current) use of aspirin: Secondary | ICD-10-CM | POA: Insufficient documentation

## 2020-11-10 DIAGNOSIS — R079 Chest pain, unspecified: Secondary | ICD-10-CM | POA: Diagnosis not present

## 2020-11-10 LAB — D-DIMER, QUANTITATIVE: D-Dimer, Quant: 0.65 ug/mL-FEU — ABNORMAL HIGH (ref 0.00–0.50)

## 2020-11-10 LAB — CBC WITH DIFFERENTIAL/PLATELET
Abs Immature Granulocytes: 0.03 10*3/uL (ref 0.00–0.07)
Basophils Absolute: 0.1 10*3/uL (ref 0.0–0.1)
Basophils Relative: 1 %
Eosinophils Absolute: 0.1 10*3/uL (ref 0.0–0.5)
Eosinophils Relative: 1 %
HCT: 39.3 % (ref 36.0–46.0)
Hemoglobin: 13.4 g/dL (ref 12.0–15.0)
Immature Granulocytes: 0 %
Lymphocytes Relative: 18 %
Lymphs Abs: 1.5 10*3/uL (ref 0.7–4.0)
MCH: 30.1 pg (ref 26.0–34.0)
MCHC: 34.1 g/dL (ref 30.0–36.0)
MCV: 88.3 fL (ref 80.0–100.0)
Monocytes Absolute: 0.7 10*3/uL (ref 0.1–1.0)
Monocytes Relative: 8 %
Neutro Abs: 5.9 10*3/uL (ref 1.7–7.7)
Neutrophils Relative %: 72 %
Platelets: 315 10*3/uL (ref 150–400)
RBC: 4.45 MIL/uL (ref 3.87–5.11)
RDW: 12.6 % (ref 11.5–15.5)
WBC: 8.3 10*3/uL (ref 4.0–10.5)
nRBC: 0 % (ref 0.0–0.2)

## 2020-11-10 LAB — BASIC METABOLIC PANEL
Anion gap: 9 (ref 5–15)
BUN: 13 mg/dL (ref 8–23)
CO2: 26 mmol/L (ref 22–32)
Calcium: 8.6 mg/dL — ABNORMAL LOW (ref 8.9–10.3)
Chloride: 95 mmol/L — ABNORMAL LOW (ref 98–111)
Creatinine, Ser: 0.77 mg/dL (ref 0.44–1.00)
GFR, Estimated: >60 mL/min (ref >60)
Glucose, Bld: 128 mg/dL — ABNORMAL HIGH (ref 70–99)
Potassium: 4.3 mmol/L (ref 3.5–5.1)
Sodium: 130 mmol/L — ABNORMAL LOW (ref 135–145)

## 2020-11-10 LAB — TROPONIN I (HIGH SENSITIVITY): Troponin I (High Sensitivity): 4 ng/L (ref <18)

## 2020-11-10 MED ORDER — LACTATED RINGERS IV BOLUS
1000.0000 mL | Freq: Once | INTRAVENOUS | Status: AC
  Administered 2020-11-10: 1000 mL via INTRAVENOUS

## 2020-11-10 NOTE — ED Provider Notes (Signed)
Rachel Clark Provider Note   CSN: [id number] Arrival date & time: 11/10/20  [id number]     History Chief Complaint  Patient presents with  . Fall    Rachel Clark is a 70 y.o. female.   Fall This is a new problem. The current episode started yesterday. The problem occurs constantly. The problem has not changed since onset.Associated symptoms include chest pain (left). Pertinent negatives include no headaches and no shortness of breath. The symptoms are aggravated by twisting and bending. The symptoms are relieved by rest. She has tried nothing for the symptoms. The treatment provided no relief.       Past Medical History:  Diagnosis Date  . Arthritis    "fingers" (06/29/2016)  . Chronic lower back pain   . Colon cancer ([place]) 12/04/2011   s/p Laparoscopic-assisted transverse colectomy on 12/19/2011 by Dr. Donne Hazel.  pT3 N0 M0.   . Coronary artery disease    a. remote NSTEMI with PCI to the RCA; b. S/P CABG in 2007; c. 2010 - 2 v CAD with L-LAD and S-RCA patent, small caliber Dx 70% (treated medically - no amenable to PCI), CFX free of significant disease, normal LVF. d. 10/2014 Lexi MV: EF 61%, no ischemia/infarct.  . Depression   . Dyslipidemia   . Fibromyalgia   . Gastric ulcer   . GERD (gastroesophageal reflux disease)   . 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 Headache    "weekly" (06/29/2016)  . Hyperlipidemia   . Hypothyroid   . Iron deficiency anemia 11/17/2011  . Memory change 11/13/2016  . Monoclonal gammopathy of unknown significance   . Myocardial infarction ([place]) 2007  . Presence of permanent cardiac pacemaker   . Syncope and collapse    pacemaker implanted  . Unstable angina [facility])     Patient Active Problem List   Diagnosis Date Noted  . Memory change 11/13/2016  . Acute bronchitis 03/23/2016  . Chest pain with high risk for cardiac etiology 11/05/2015  .  Essential hypertension 08/25/2015  . Sick sinus syndrome ([place]) 08/16/2015  . Syncope and collapse 08/10/2015  . CAD, multiple vessel   . Chest pain   . Cardiac pacemaker in situ 07/25/2015  . Angina pectoris ([place]) 03/24/2015  . Unstable angina ([employer]) 10/26/2014  . Seizures ([place]) 02/19/2014  . Lung nodule 02/06/2013  . Thyroid nodule 02/06/2013  . History of colon cancer 12/04/2011  . GERD (gastroesophageal reflux disease) 08/17/2011  . Monoclonal gammopathy of unknown significance   . Hypothyroid   . Coronary artery disease, post CABG 2007    . Dyslipidemia     Past Surgical History:  Procedure Laterality Date  . ANTERIOR CERVICAL DECOMP/DISCECTOMY FUSION  1980's  . BACK SURGERY    . CARDIAC CATHETERIZATION  05/20/2009   obstructive native vessel disease in LAD, RCA, and first diagonal, patent vein graft to distal RCA and LIMA to LAD,normal. ef 60%  . CARDIAC CATHETERIZATION N/A 03/24/2015   Procedure: Left Heart Cath and Cors/Grafts Angiography;  Surgeon: Wellington Hampshire, MD;  Location: [place];  Service: Cardiovascular;  Laterality: N/A;  . CARDIAC CATHETERIZATION N/A 07/28/2015   Procedure: Left Heart Cath and Coronary Angiography;  Surgeon: Troy Sine, MD;  Location: [place] [facility];  Service: Cardiovascular;  Laterality: N/A;  . CARDIAC CATHETERIZATION N/A 11/09/2015   Procedure: Coronary Stent Intervention;  Surgeon: Sherren Mocha, MD;  Location: [facility]  INVASIVE CV LAB;  Service: Cardiovascular;  Laterality: N/A;  . CARDIAC CATHETERIZATION N/A 11/09/2015   Procedure: Left Heart Cath and Coronary Angiography;  Surgeon: Sherren Mocha, MD;  Location: [place] CV LAB;  Service: Cardiovascular;  Laterality: N/A;  . CARDIAC CATHETERIZATION N/A 06/29/2016   Procedure: Left Heart Cath and Coronary Angiography;  Surgeon: Nelva Bush, MD;  Location: [place] [facility];  Service: Cardiovascular;  Laterality: N/A;  . CARDIAC CATHETERIZATION N/A 06/29/2016   Procedure:  Intravascular Pressure Wire/FFR Study;  Surgeon: Nelva Bush, MD;  Location: [place] CV LAB;  Service: Cardiovascular;  Laterality: N/A;  . CARDIAC CATHETERIZATION N/A 06/29/2016   Procedure: Coronary Balloon Angioplasty;  Surgeon: Nelva Bush, MD;  Location: [place] [facility];  Service: Cardiovascular;  Laterality: N/A;  . COLON RESECTION  12/19/2011   Procedure: COLON RESECTION LAPAROSCOPIC;  Surgeon: Rolm Bookbinder, MD;  Location: [place];  Service: General;  Laterality: N/A;  laparoscopic hand assisted partial colon resection  . COLON SURGERY    . COLONOSCOPY    . CORONARY ANGIOPLASTY WITH STENT PLACEMENT  ~ 2007   1 stent  . CORONARY ARTERY BYPASS GRAFT  2007   "CABG X2"  . DILATION AND CURETTAGE OF UTERUS  1973  . EP IMPLANTABLE DEVICE N/A 03/25/2015   MDT Advisa DR pacemaker implanted by Dr Rayann Heman for transient complete heart block and syncope  . INSERT / REPLACE / REMOVE PACEMAKER    . LEFT HEART CATH AND CORS/GRAFTS ANGIOGRAPHY N/A 02/21/2018   Procedure: LEFT HEART CATH AND CORS/GRAFTS ANGIOGRAPHY;  Surgeon: Lorretta Harp, MD;  Location: [place] CV [facility];  Service: Cardiovascular;  Laterality: N/A;  . PORT-A-CATH REMOVAL N/A 06/12/2013   Procedure: REMOVAL PORT-A-CATH;  Surgeon: Rolm Bookbinder, MD;  Location: [place];  Service: General;  Laterality: N/A;  . PORTACATH PLACEMENT  06/04/2012   Procedure: INSERTION PORT-A-CATH;  Surgeon: Rolm Bookbinder, MD;  Location: [place];  Service: General;  Laterality: N/A;  Insertion of port-a-cath   . POSTERIOR LAMINECTOMY / DECOMPRESSION CERVICAL SPINE  1990s  . VAGINAL HYSTERECTOMY       OB History   No obstetric history on file.     Family History  Problem Relation Age of Onset  . Heart attack Father   . Heart disease Father   . Heart attack Brother 43  . Cancer Mother        lung  . Stroke Neg Hx     Social History   Tobacco Use  . Smoking status: Former Smoker    Packs/day: 0.30     Years: 20.00    Pack years: 6.00    Types: Cigarettes    Quit date: 06/06/1981    Years since quitting: 39.4  . Smokeless tobacco: Never Used  Vaping Use  . Vaping Use: Never used  Substance Use Topics  . Alcohol use: Yes    Alcohol/week: 1.0 standard drink    Types: 1 Glasses of wine per week  . Drug use: No    Home Medications Prior to Admission medications   Medication Sig Start Date End Date Taking? Authorizing Provider  acetaminophen (TYLENOL) 500 MG tablet Take 500-1,000 mg by mouth daily as needed for mild pain or headache.   Yes [provider]  amLODipine (NORVASC) 5 MG tablet Take 1 tablet (5 mg total) by mouth daily. 09/10/20 08/31/22 Yes [name], Peter M, MD  aspirin 81 MG chewable tablet Chew 1 tablet (81 mg total) by mouth daily. 06/30/16  Yes Arbutus Leas, NP  Azelastine HCl 0.15 % SOLN Place 1 spray into both nostrils daily as needed (seasonal allergies).  12/22/14  Yes [provider]  carbamazepine (TEGRETOL XR) 200 MG 12 hr tablet Take 3 tablets (600 mg total) by mouth 2 (two) times daily. 08/17/20  Yes Suzzanne Cloud, NP  carvedilol (COREG) 3.125 MG tablet Take 1 tablet (3.125 mg total) by mouth 2 (two) times daily. 09/10/20 09/05/21 Yes [name], Peter M, MD  DULoxetine (CYMBALTA) 60 MG capsule Take 60 mg by mouth daily.    Yes [provider]  ethosuximide (ZARONTIN) 250 MG capsule Take 2 in the morning, take 2 at night 08/17/20  Yes Suzzanne Cloud, NP  ezetimibe (ZETIA) 10 MG tablet Take 1 tablet (10 mg total) by mouth daily. 09/10/20  Yes [name], Peter M, MD  levothyroxine (SYNTHROID, LEVOTHROID) 50 MCG tablet Take 50 mcg by mouth daily.   Yes [provider]  memantine (NAMENDA) 10 MG tablet Take 1 tablet (10 mg total) by mouth 2 (two) times daily. 08/17/20  Yes Suzzanne Cloud, NP  rosuvastatin (CRESTOR) 5 MG tablet Take 1 tablet by mouth up to 3 times weekly as directed 09/10/20  Yes [name], Peter M, MD  estradiol (VIVELLE-DOT) 0.05  MG/24HR patch Place 1 patch onto the skin 2 (two) times a week. Sunday, Wednesday    [provider]  ibuprofen (ADVIL,MOTRIN) 600 MG tablet Take 1 tablet (600 mg total) by mouth every 8 (eight) hours as needed. Patient taking differently: Take 600 mg by mouth every 8 (eight) hours as needed for headache or mild pain. 04/07/17   Jola Schmidt, MD  nitroGLYCERIN (NITROSTAT) 0.4 MG SL tablet Place 1 tablet (0.4 mg total) under the tongue every 5 (five) minutes as needed. For chest pain. 09/10/20   [name], Peter M, MD    Allergies    Aspirin, Erythromycin, Lisinopril, Niacin and related, Statins, Sulfa drugs cross reactors, Tetracyclines & related, Penicillins, and Plavix [clopidogrel bisulfate]  Review of Systems   Review of Systems  Constitutional: Negative for chills and fever.  HENT: Negative for congestion and rhinorrhea.   Respiratory: Negative for cough and shortness of breath.   Cardiovascular: Positive for chest pain (left). Negative for palpitations and leg swelling.  Gastrointestinal: Negative for diarrhea, nausea and vomiting.  Genitourinary: Negative for difficulty urinating and dysuria.  Musculoskeletal: Negative for arthralgias and back pain.  Skin: Negative for rash and wound.  Neurological: Positive for syncope and light-headedness. Negative for headaches.    Physical Exam Updated Vital Signs BP (!) 143/67   Pulse 62   Temp (!) 97.5 F (36.4 C) (Oral)   Resp 16   Ht 5\' 9"  (1.753 m)   Wt 81.6 kg   SpO2 98%   BMI 26.58 kg/m   Physical Exam Vitals and nursing note reviewed. Exam conducted with a chaperone present.  Constitutional:      General: She is not in acute distress.    Appearance: Normal appearance.  HENT:     Head: Normocephalic and atraumatic.     Nose: No rhinorrhea.  Eyes:     General:        Right eye: No discharge.        Left eye: No discharge.     Conjunctiva/sclera: Conjunctivae normal.  Cardiovascular:     Rate and Rhythm: Normal  rate and regular rhythm.  Pulmonary:     Effort: Pulmonary effort is normal. No respiratory distress.  Breath sounds: No stridor. No wheezing or rales.  Chest:     Chest wall: Tenderness (left lateral) present.  Abdominal:     General: Abdomen is flat. There is no distension.     Palpations: Abdomen is soft.     Tenderness: There is no abdominal tenderness.  Musculoskeletal:        General: No tenderness or signs of injury.     Right lower leg: No edema.     Left lower leg: No edema.  Skin:    General: Skin is warm and dry.  Neurological:     General: No focal deficit present.     Mental Status: She is alert. Mental status is at baseline.     Motor: No weakness.  Psychiatric:        Mood and Affect: Mood normal.        Behavior: Behavior normal.     ED Results / Procedures / Treatments   Labs (all labs ordered are listed, but only abnormal results are displayed) Labs Reviewed  D-DIMER, QUANTITATIVE - Abnormal; Notable for the following components:      Result Value   D-Dimer, Quant 0.65 (*)    All other components within normal limits  BASIC METABOLIC PANEL - Abnormal; Notable for the following components:   Sodium 130 (*)    Chloride 95 (*)    Glucose, Bld 128 (*)    Calcium 8.6 (*)    All other components within normal limits  CBC WITH DIFFERENTIAL/PLATELET  TROPONIN I (HIGH SENSITIVITY)    EKG EKG Interpretation  Date/Time:  Wednesday Nov 10 2020 09:30:10 EDT Ventricular Rate:  66 PR Interval:  219 QRS Duration: 98 QT Interval:  408 QTC Calculation: 428 R Axis:   22 Text Interpretation: Sinus rhythm Borderline prolonged PR interval and 1 PVC. Confirmed by Dewaine Conger [phone]) on 11/10/2020 9:34:12 AM   Radiology DG Chest 2 View  Result Date: 11/10/2020 CLINICAL DATA:  Fall with left chest pain EXAM: CHEST - 2 VIEW COMPARISON:  07/11/2019 FINDINGS: Normal heart size and mediastinal contours. CABG. Coronary stenting. Dual-chamber pacer leads from the left  in unremarkable position. There is no edema, consolidation, effusion, or pneumothorax. No visible fracture. IMPRESSION: No acute finding. Electronically Signed   By: Monte Fantasia M.D.   On: 11/10/2020 09:49    Procedures Procedures   Medications Ordered in ED Medications  lactated ringers bolus 1,000 mL (0 mLs Intravenous Stopped 11/10/20 1103)    ED Course  I have reviewed the triage vital signs and the nursing notes.  Pertinent labs & imaging results that were available during my care of the patient were reviewed by me and considered in my medical decision making (see chart for details).    MDM Rules/Calculators/A&P                          Fall yesterday.  Patient has had recurrent episodes of orthostatic hypotension and syncope.  Has been seen by her cardiologist has been seen in the hospital all negative work-up thus far.  They have cut down her blood pressure medications this is not helped.  Will give fluids will check heart function we will attempt to interrogate pacemaker will get chest x-ray to screen for blood clot and then reassess patient.  Laboratory studies EKG and imaging are unremarkable.  Age-adjusted D-dimer is negative.  Interrogation of pacemaker shows no focal abnormalities consistent with patient's symptoms.  I feel this is  her chronic issues orthostatic hypotension, she has mild hyponatremia she has been told to be more liberal with her salt intake and she continues to do so I have encouraged her to do more.  Outpatient follow-up return precautions discussed  Final Clinical Impression(s) / ED Diagnoses Final diagnoses:  Syncope, unspecified syncope type    Rx / DC Orders ED Discharge Orders    None       Breck Coons, MD 11/10/20 1252

## 2020-11-10 NOTE — ED Notes (Signed)
Pacemaker interrogated at approx 0930 at bedside

## 2020-11-10 NOTE — Telephone Encounter (Signed)
Called and wife could not tell me what it was about.  Hopefully the husband will get message.

## 2020-11-10 NOTE — ED Triage Notes (Signed)
Fall at approx 10am yesterday 5/10 to L side.  Denies LOC or any head injury.  Pt c/o L rib pain today.  Does endorse some orthostatic hypotension at baseline.  ASA PO daily, denies any other thinners

## 2020-11-10 NOTE — ED Notes (Signed)
[employer] contacted regarding delay in care,  Representative transferred call to another agent who states the "[employer] rep will send this asap".

## 2020-11-10 NOTE — ED Notes (Signed)
Patient transported to X-ray. Will get EKG when she returns.

## 2020-11-10 NOTE — ED Notes (Signed)
Interrogation report fax arrived.

## 2020-11-11 ENCOUNTER — Telehealth: Payer: Self-pay | Admitting: Cardiology

## 2020-11-11 NOTE — Telephone Encounter (Signed)
Pt c/o medication issue:  1. Name of Medication:  amLODipine (NORVASC) 5 MG tablet  2. How are you currently taking this medication (dosage and times per day)?  As precribed  3. Are you having a reaction (difficulty breathing--STAT)?  Unsure  4. What is your medication issue?   Patient's husband states on 11/09/20 the patient had a syncopal episode. They assumed there was no internal damage and that everything was fine, but on 11/10/20 the patient was having pain in her ribs. He took the patient to the ED and also discussed everything with patient's PCP. He states patient's PCP assumes the Amlodipine may be the issue. Per patient's husband, PCP decreased patient's Amlodipine to 2.5 MG immediately. He states the PCP would really prefer for the patient to discontinue this medication all together. He would like a call back from Dr. Doug Sou nurse to discuss before the patient cuts back.  He also provided BP readings for 05/11:  135/80 140/73 147/69

## 2020-11-11 NOTE — Telephone Encounter (Signed)
Left message to call back  

## 2020-11-17 NOTE — Telephone Encounter (Signed)
Called patient no answer.Left message on personal voice mail to call me back.

## 2020-11-17 NOTE — Telephone Encounter (Signed)
Yes I agree. Let's stop amlodipine and see how she does. I would rather BP run a little high than have these drops in BP  Rachel Vazques [name] MD, [facility]

## 2020-11-17 NOTE — Telephone Encounter (Signed)
Called patient no answer.Left message on personal voice mail to call me back. 

## 2020-11-22 NOTE — Telephone Encounter (Signed)
Patient never returned phone calls.

## 2020-11-24 NOTE — Telephone Encounter (Signed)
I called and left a message for Rachel Clark.  If relating to wanting to change to another provider in the office regards to Dr. Jannifer Franklin retiring that would be something that we will be doing sometime in the near future.  Not necessarily at this point but if it something else you can give me a call back.

## 2020-11-24 NOTE — Telephone Encounter (Signed)
Pt's husband, Penelope Fittro (on [place]) asking for a call back to discuss referring her to another provider in the office.

## 2020-11-25 NOTE — Telephone Encounter (Signed)
I called Mr. Was I will just to follow-up since we missed each other multiple times.  He is looking for someone in the practice that specializes in memory issues which we do see his wife for this she is on memantine 10 mg twice daily at this time also seizures.  He feels like her memory is worsening and would like to see an MD.  I did relay that you were retiring and we have not made decisions yet in regards to giving patients to other providers.   Looking at Dr. Tobey Grim schedule 730 on 12/21/2020 was available he said he would make that work to see Dr. Jannifer Franklin I told to arrive at 7:00 for MMSE.  Was very appreciative.

## 2020-11-29 ENCOUNTER — Other Ambulatory Visit: Payer: Self-pay | Admitting: Neurology

## 2020-12-02 ENCOUNTER — Telehealth: Payer: Self-pay | Admitting: Cardiology

## 2020-12-02 NOTE — Telephone Encounter (Signed)
Called patient no answer.Left message on personal voice mail to call back. 

## 2020-12-02 NOTE — Telephone Encounter (Signed)
Patient called to say that Dr. [name] told her to increase her sodium but she is unsure how to do that. Patient is working if there is a pill she can take. Please advise

## 2020-12-07 NOTE — Telephone Encounter (Signed)
Called patient on cell # and home # listed in chart.Both calls went to voice mail.Left message to call back.

## 2020-12-10 NOTE — Telephone Encounter (Signed)
Left message for patient to eat salty foods or soup. She is to call back with questions.

## 2020-12-17 DIAGNOSIS — I10 Essential (primary) hypertension: Secondary | ICD-10-CM | POA: Diagnosis not present

## 2020-12-17 DIAGNOSIS — I951 Orthostatic hypotension: Secondary | ICD-10-CM | POA: Diagnosis not present

## 2020-12-17 DIAGNOSIS — I2510 Atherosclerotic heart disease of native coronary artery without angina pectoris: Secondary | ICD-10-CM | POA: Diagnosis not present

## 2020-12-17 DIAGNOSIS — I495 Sick sinus syndrome: Secondary | ICD-10-CM | POA: Diagnosis not present

## 2020-12-17 DIAGNOSIS — R55 Syncope and collapse: Secondary | ICD-10-CM | POA: Diagnosis not present

## 2020-12-17 LAB — HEPATIC FUNCTION PANEL
ALT: 14 IU/L (ref 0–32)
AST: 12 IU/L (ref 0–40)
Albumin: 4.2 g/dL (ref 3.7–4.7)
Alkaline Phosphatase: 91 IU/L (ref 44–121)
Bilirubin Total: 0.2 mg/dL (ref 0.0–1.2)
Bilirubin, Direct: <0.1 mg/dL (ref 0.00–0.40)
Total Protein: 6.7 g/dL (ref 6.0–8.5)

## 2020-12-17 LAB — BASIC METABOLIC PANEL
BUN/Creatinine Ratio: 19 (ref 12–28)
BUN: 14 mg/dL (ref 8–27)
CO2: 24 mmol/L (ref 20–29)
Calcium: 8.9 mg/dL (ref 8.7–10.3)
Chloride: 94 mmol/L — ABNORMAL LOW (ref 96–106)
Creatinine, Ser: 0.75 mg/dL (ref 0.57–1.00)
Glucose: 106 mg/dL — ABNORMAL HIGH (ref 65–99)
Potassium: 4.7 mmol/L (ref 3.5–5.2)
Sodium: 132 mmol/L — ABNORMAL LOW (ref 134–144)
eGFR: 85 mL/min/{1.73_m2} (ref >59)

## 2020-12-17 LAB — LIPID PANEL
Chol/HDL Ratio: 3 ratio (ref 0.0–4.4)
Cholesterol, Total: 261 mg/dL — ABNORMAL HIGH (ref 100–199)
HDL: 86 mg/dL (ref >39)
LDL Chol Calc (NIH): 151 mg/dL — ABNORMAL HIGH (ref 0–99)
Triglycerides: 141 mg/dL (ref 0–149)
VLDL Cholesterol Cal: 24 mg/dL (ref 5–40)

## 2020-12-21 ENCOUNTER — Encounter: Payer: Self-pay | Admitting: Neurology

## 2020-12-21 ENCOUNTER — Other Ambulatory Visit: Payer: Self-pay

## 2020-12-21 ENCOUNTER — Ambulatory Visit (INDEPENDENT_AMBULATORY_CARE_PROVIDER_SITE_OTHER): Payer: Medicare Other | Admitting: Neurology

## 2020-12-21 VITALS — BP 141/88 | HR 81 | Ht 69.0 in | Wt 175.6 lb

## 2020-12-21 DIAGNOSIS — R413 Other amnesia: Secondary | ICD-10-CM | POA: Diagnosis not present

## 2020-12-21 DIAGNOSIS — I951 Orthostatic hypotension: Secondary | ICD-10-CM

## 2020-12-21 DIAGNOSIS — Z5181 Encounter for therapeutic drug level monitoring: Secondary | ICD-10-CM

## 2020-12-21 DIAGNOSIS — R55 Syncope and collapse: Secondary | ICD-10-CM | POA: Diagnosis not present

## 2020-12-21 DIAGNOSIS — I2510 Atherosclerotic heart disease of native coronary artery without angina pectoris: Secondary | ICD-10-CM | POA: Diagnosis not present

## 2020-12-21 DIAGNOSIS — R569 Unspecified convulsions: Secondary | ICD-10-CM

## 2020-12-21 HISTORY — DX: Orthostatic hypotension: I95.1

## 2020-12-21 NOTE — Progress Notes (Signed)
Reason for visit: Memory disturbance, seizures, orthostatic hypotension  Rachel Clark is an 70 y.o. female  History of present illness:  Ms. Belknap is a 70 year old right-handed white female with a history of seizures since she was a child, she has not had a seizure in over 50 years, she is on carbamazepine and Zarontin, she has never wished to try to get off of her medication.  Her son also has a history of seizures.  Since August 2019 she has had low sodium levels.  She has been on a combination of carbamazepine and Cymbalta, but she has been on this for quite a number of years without any sodium level problems.  The patient has more recently had significant issues with orthostatic hypotension.  She does have a monoclonal antibody and is followed through hematology.  She has not had a demonstrated peripheral neuropathy on nerve conduction studies in the past.  The patient however has had significant issues with being dizzy with standing, she has had multiple syncopal episodes, the most recent was 2 days ago.  The patient has been to the emergency room on 04 September 2020 after she hit her head with a blackout.  She went to the emergency room on 10 Nov 2020 with another significant blackout.  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  The patient is followed by Dr. Peter [name].  Blood pressure medications have been reduced in dosing.  The patient has gone from 10 mg a day to 2.5 mg and Norvasc, and the Coreg dose was cut in half.  The patient has been told to increase fluids and salt.  She does have compression stockings.  The patient has had some decline in memory over time.  She is no longer driving mainly due to the dizziness issue.  The patient has had increasing problems with remembering recipes with cooking, she is still managing her medications.  Her husband helps her with the appointments.  The patient is repeating herself frequently  throughout the day.  The patient claims that she feels bored, isolated.  She sleeps quite a bit throughout the day.  She comes to this office for further evaluation.  The patient does have a prior history of depression which is still an issue for her.  Past Medical History:  Diagnosis Date   Arthritis    "fingers" (06/29/2016)   Chronic lower back pain    Colon cancer ([place]) 12/04/2011   s/p Laparoscopic-assisted transverse colectomy on 12/19/2011 by Dr. Donne Hazel.  pT3 N0 M0.    Coronary artery disease    a. remote NSTEMI with PCI to the RCA; b. S/P CABG in 2007; c. 2010 - 2 v CAD with L-LAD and S-RCA patent, small caliber Dx 70% (treated medically - no amenable to PCI), CFX free of significant disease, normal LVF. d. 10/2014 Lexi MV: EF 61%, no ischemia/infarct.   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) 2007   Presence of permanent cardiac pacemaker    Syncope and collapse  pacemaker implanted   Unstable angina [facility])     Past Surgical History:  Procedure Laterality Date   ANTERIOR CERVICAL DECOMP/DISCECTOMY FUSION  1980's   BACK SURGERY     CARDIAC CATHETERIZATION  05/20/2009   obstructive native vessel disease in LAD, RCA, and first diagonal, patent vein graft to distal RCA and LIMA to LAD,normal. ef 60%   CARDIAC CATHETERIZATION N/A 03/24/2015   Procedure: Left Heart Cath and Cors/Grafts Angiography;  Surgeon: Wellington Hampshire, MD;  Location: [place] [facility];  Service: Cardiovascular;  Laterality: N/A;   CARDIAC CATHETERIZATION N/A 07/28/2015   Procedure: Left Heart Cath and Coronary Angiography;   Surgeon: [place] Sine, MD;  Location: [place] [facility];  Service: Cardiovascular;  Laterality: N/A;   CARDIAC CATHETERIZATION N/A 11/09/2015   Procedure: Coronary Stent Intervention;  Surgeon: Sherren Mocha, MD;  Location: [place] [facility];  Service: Cardiovascular;  Laterality: N/A;   CARDIAC CATHETERIZATION N/A 11/09/2015   Procedure: Left Heart Cath and Coronary Angiography;  Surgeon: Sherren Mocha, MD;  Location: [place] [facility];  Service: Cardiovascular;  Laterality: N/A;   CARDIAC CATHETERIZATION N/A 06/29/2016   Procedure: Left Heart Cath and Coronary Angiography;  Surgeon: Nelva Bush, MD;  Location: [place] [facility];  Service: Cardiovascular;  Laterality: N/A;   CARDIAC CATHETERIZATION N/A 06/29/2016   Procedure: Intravascular Pressure Wire/FFR Study;  Surgeon: Nelva Bush, MD;  Location: [place] [facility];  Service: Cardiovascular;  Laterality: N/A;   CARDIAC CATHETERIZATION N/A 06/29/2016   Procedure: Coronary Balloon Angioplasty;  Surgeon: Nelva Bush, MD;  Location: [place] [facility];  Service: Cardiovascular;  Laterality: N/A;   COLON RESECTION  12/19/2011   Procedure: COLON RESECTION LAPAROSCOPIC;  Surgeon: Rolm Bookbinder, MD;  Location: [place];  Service: General;  Laterality: N/A;  laparoscopic hand assisted partial colon resection   COLON SURGERY     COLONOSCOPY     CORONARY ANGIOPLASTY WITH STENT PLACEMENT  ~ 2007   1 stent   CORONARY ARTERY BYPASS GRAFT  2007   "CABG X2"   DILATION AND CURETTAGE OF UTERUS  1973   EP IMPLANTABLE DEVICE N/A 03/25/2015   MDT Advisa DR pacemaker implanted by Dr Rayann Heman for transient complete heart block and syncope   INSERT / REPLACE / REMOVE PACEMAKER     LEFT HEART CATH AND CORS/GRAFTS ANGIOGRAPHY N/A 02/21/2018   Procedure: LEFT HEART CATH AND CORS/GRAFTS ANGIOGRAPHY;  Surgeon: Lorretta Harp, MD;  Location: [place] [facility];  Service: Cardiovascular;  Laterality: N/A;   PORT-A-CATH REMOVAL N/A 06/12/2013    Procedure: REMOVAL PORT-A-CATH;  Surgeon: Rolm Bookbinder, MD;  Location: [place];  Service: General;  Laterality: N/A;   PORTACATH PLACEMENT  06/04/2012   Procedure: INSERTION PORT-A-CATH;  Surgeon: Rolm Bookbinder, MD;  Location: [facility];  Service: General;  Laterality: N/A;  Insertion of port-a-cath    POSTERIOR LAMINECTOMY / DECOMPRESSION CERVICAL SPINE  1990s   VAGINAL HYSTERECTOMY      Family History  Problem Relation Age of Onset   Heart attack Father    Heart disease Father    Heart attack Brother 36   Cancer Mother        lung   Stroke Neg Hx     Social history:  reports that she quit smoking about 39 years ago. Her smoking use included cigarettes. She has a 6.00 pack-year smoking history. She has never used smokeless tobacco. She reports current alcohol use of about 1.0 standard drink of alcohol  per week. She reports that she does not use drugs.    Allergies  Allergen Reactions   Aspirin Other (See Comments)    GI upset- can tolerate 81 mg ASA, just not full doses   Erythromycin Nausea Only    Gi upset   Lisinopril Cough   Niacin And Related Other (See Comments)    Pt does not recall reactions    Statins Other (See Comments)    Muscle pain   Sulfa Drugs Cross Reactors Swelling   Tetracyclines & Related Nausea Only   Penicillins Nausea Only and Rash    Has patient had a PCN reaction causing immediate rash, facial/tongue/throat swelling, SOB or lightheadedness with hypotension: Yes Has patient had a PCN reaction causing severe rash involving mucus membranes or skin necrosis: No Has patient had a PCN reaction that required hospitalization No Has patient had a PCN reaction occurring within the last 10 years: No If all of the above answers are "NO", then may proceed with Cephalosporin use.    Plavix [Clopidogrel Bisulfate] Rash    Medications:  Prior to Admission medications   Medication Sig Start Date End Date Taking? Authorizing Provider   acetaminophen (TYLENOL) 500 MG tablet Take 500-1,000 mg by mouth daily as needed for mild pain or headache.   Yes [provider]  amLODipine (NORVASC) 5 MG tablet Take 1 tablet (5 mg total) by mouth daily. Patient taking differently: Take 2.5 mg by mouth daily. 09/10/20 08/31/22 Yes [name], Peter M, MD  aspirin 81 MG chewable tablet Chew 1 tablet (81 mg total) by mouth daily. 06/30/16  Yes Arbutus Leas, NP  Azelastine HCl 0.15 % SOLN Place 1 spray into both nostrils daily as needed (seasonal allergies).  12/22/14  Yes [provider]  carbamazepine (TEGRETOL XR) 200 MG 12 hr tablet Take 3 tablets (600 mg total) by mouth 2 (two) times daily. 08/17/20  Yes Suzzanne Cloud, NP  carvedilol (COREG) 3.125 MG tablet Take 1 tablet (3.125 mg total) by mouth 2 (two) times daily. 09/10/20 09/05/21 Yes [name], Peter M, MD  DULoxetine (CYMBALTA) 60 MG capsule Take 60 mg by mouth daily.    Yes [provider]  ethosuximide (ZARONTIN) 250 MG capsule Take 2 in the morning, take 2 at night 08/17/20  Yes Suzzanne Cloud, NP  ezetimibe (ZETIA) 10 MG tablet Take 1 tablet (10 mg total) by mouth daily. 09/10/20  Yes [name], Peter M, MD  ibuprofen (ADVIL,MOTRIN) 600 MG tablet Take 1 tablet (600 mg total) by mouth every 8 (eight) hours as needed. Patient taking differently: Take 600 mg by mouth every 8 (eight) hours as needed for headache or mild pain. 04/07/17  Yes Jola Schmidt, MD  levothyroxine (SYNTHROID, LEVOTHROID) 50 MCG tablet Take 50 mcg by mouth daily.   Yes [provider]  memantine (NAMENDA) 10 MG tablet Take 1 tablet (10 mg total) by mouth 2 (two) times daily. 08/17/20  Yes Suzzanne Cloud, NP  nitroGLYCERIN (NITROSTAT) 0.4 MG SL tablet Place 1 tablet (0.4 mg total) under the tongue every 5 (five) minutes as needed. For chest pain. 09/10/20  Yes [name], Peter M, MD  rosuvastatin (CRESTOR) 5 MG tablet Take 1 tablet by mouth up to 3 times weekly as directed 09/10/20  Yes [name], Peter M,  MD    ROS:  Out of a complete 14 system review of symptoms, the patient complains only of the following symptoms, and all other reviewed systems are negative.  Dizziness, syncope Memory problems  History of seizures  Blood pressure (!) 141/88, pulse 81, height 5\' 9"  (1.753 m), weight 175 lb 9.6 oz (79.7 kg).  Blood pressure, right arm, standing is 110/70.  Blood pressure, sitting, right arm is 142/84.  Physical Exam  General: The patient is alert and cooperative at the time of the examination.  Skin: No significant peripheral edema is noted.   Neurologic Exam  Mental status: The patient is alert and oriented x 3 at the time of the examination. The Mini-Mental status examination done today shows a total score of 19/30.   Cranial nerves: Facial symmetry is present. Speech is normal, no aphasia or dysarthria is noted. Extraocular movements are full. Visual fields are full.  Motor: The patient has good strength in all 4 extremities.  Sensory examination: Soft touch sensation is symmetric on the face, arms, and legs.  Coordination: The patient has good finger-nose-finger and heel-to-shin bilaterally.  Gait and station: The patient has a normal gait. Tandem gait is normal. Romberg is negative. No drift is seen.  Reflexes: Deep tendon reflexes are symmetric.   CT head 09/04/20:  IMPRESSION: 1. No acute intracranial abnormality. 2. LEFT forehead subcutaneous hematoma.  * CT scan images were reviewed online. I agree with the written report.    Assessment/Plan:  1.  Memory disturbance  2.  History of seizures, well controlled  3.  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  Orthostatic hypotension  5.  MGUS, IgG  6.  Cardiac pacemaker placement  The patient has had significant issues with dizziness and orthostasis and syncope.  This is limiting her ability to perform physical activities.  She is sleeping a good portion of the day, I have indicated  that she should never sleep with her head flat, she should raise the head of the bed or sleep in a recliner.  The patient will undergo a carotid Doppler study.  Her syncopal episodes are quite brief, lasting only a few seconds.  There is no evidence that she is having seizures.  The patient will continue her carbamazepine and Zarontin for now, blood work will be done.  She will follow-up here in 6 months.  Jill Alexanders MD 12/21/2020 7:16 AM  [facility] [address] [place], [place] [address]  Phone [phone] Fax [phone]

## 2020-12-22 ENCOUNTER — Telehealth: Payer: Self-pay | Admitting: Emergency Medicine

## 2020-12-22 LAB — ZONISAMIDE LEVEL: Zonisamide: <2 ug/mL — ABNORMAL LOW (ref 10.0–40.0)

## 2020-12-22 LAB — CARBAMAZEPINE LEVEL, TOTAL: Carbamazepine (Tegretol), S: 10 ug/mL (ref 4.0–12.0)

## 2020-12-22 NOTE — Telephone Encounter (Signed)
Called and reached patient's spouse on the phone.  Went over Dr. Tobey Grim review and findings of blood work.  Patient denied further questions, verbalized understanding and expressed appreciation for the phone call.

## 2020-12-22 NOTE — Telephone Encounter (Signed)
-----   Message from Kathrynn Ducking, MD sent at 12/22/2020  1:46 PM EDT ----- Carbamazepine levels are therapeutic, Zarontin levels were not checked.  No change in medical therapy recommended. Please call the patient. ----- Message ----- From: Lavone Neri Lab Results In Sent: 12/22/2020   7:38 AM EDT To: Kathrynn Ducking, MD

## 2020-12-23 NOTE — Progress Notes (Signed)
Cardiology Office Note   Date:  12/27/2020   ID:  Rachel Clark, DOB [birth year], MRN [id number]  PCP:  Harlan Stains, MD  Cardiologist:   Corneisha Alvi [name], MD   Chief Complaint  Patient presents with   Follow-up    3 months.       History of Present Illness: Rachel Clark is a 70 y.o. 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 Subsequent failure of SVG to RCA. S/p extensive stenting of RCA in the past- last intervention in 2017 with PTCA of ostial RCA for restenosis. In August 2019 stented RCA was widely patent.    Since November 2021 she has had multiple episodes of dizziness and syncope. She was found to be orthostatic. Seen in our clinic and later by Dr Rayann Heman. Pacemaker function normal. Imdur was discontinued. Hydration seemed to improve symptoms. Echo was unremarkable.   Was seen in the ED on 09/04/20 with recurrent syncope after standing. She was orthostatic. Labs were unremarkable except mildly decreased sodium. No changes made at that time. Seen again in ED on May 11 with fall. Labs ok except low sodium level, pacer check and CXR Ok. Again hydrated. Her husband reports she passed out again on Father's day. Really doesn't have any warning. Is out only a few seconds. She has liberalized sodium intake. Wearing compression hose. Husband reports she naps 60% of the day. Seen recently by Dr Jannifer Franklin. No seizures noted. Husband has kept a BP record. Sometimes has a 20 point drop in BP with standing but at other times no change.     Past Medical History:  Diagnosis Date   Arthritis    "fingers" (06/29/2016)   Chronic lower back pain    Colon cancer ([place]) 12/04/2011   s/p Laparoscopic-assisted transverse colectomy on 12/19/2011 by Dr. Donne Hazel.  pT3 N0 M0.    Coronary artery disease    a. remote NSTEMI with PCI to the RCA; b. S/P CABG  in 2007; c. 2010 - 2 v CAD with L-LAD and S-RCA patent, small caliber Dx 70% (treated medically - no amenable to PCI), CFX free of significant disease, normal LVF. d. 10/2014 Lexi MV: EF 61%, no ischemia/infarct.   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, controlled ([place]) 12/06/2011   last seizure was in 1972 ;takes Tegretol (06/29/2016)    Headache    "weekly" (06/29/2016)   Hyperlipidemia    Hypothyroid    Iron deficiency anemia 11/17/2011   Memory change 11/13/2016   Monoclonal gammopathy of unknown significance    Myocardial infarction [facility]) 2007   Orthostatic hypotension 12/21/2020   Presence of permanent cardiac pacemaker    Syncope and collapse    pacemaker implanted   Unstable angina [facility])     Past Surgical History:  Procedure Laterality Date   ANTERIOR CERVICAL DECOMP/DISCECTOMY FUSION  1980's   BACK SURGERY     CARDIAC CATHETERIZATION  05/20/2009   obstructive native vessel disease in LAD, RCA, and first diagonal, patent vein graft to distal RCA and LIMA to LAD,normal. ef 60%   CARDIAC CATHETERIZATION N/A 03/24/2015   Procedure: Left Heart Cath and Cors/Grafts Angiography;  Surgeon: Wellington Hampshire, MD;  Location: [place] [facility];  Service: Cardiovascular;  Laterality: N/A;   CARDIAC  CATHETERIZATION N/A 07/28/2015   Procedure: Left Heart Cath and Coronary Angiography;  Surgeon: Troy Sine, MD;  Location: [place] [facility];  Service: Cardiovascular;  Laterality: N/A;   CARDIAC CATHETERIZATION N/A 11/09/2015   Procedure: Coronary Stent Intervention;  Surgeon: Sherren Mocha, MD;  Location: [place] [facility];  Service: Cardiovascular;  Laterality: N/A;   CARDIAC CATHETERIZATION N/A 11/09/2015   Procedure: Left Heart Cath and Coronary Angiography;  Surgeon: Sherren Mocha, MD;  Location: [place] [facility];  Service: Cardiovascular;  Laterality: N/A;   CARDIAC CATHETERIZATION N/A 06/29/2016    Procedure: Left Heart Cath and Coronary Angiography;  Surgeon: Nelva Bush, MD;  Location: [place] [facility];  Service: Cardiovascular;  Laterality: N/A;   CARDIAC CATHETERIZATION N/A 06/29/2016   Procedure: Intravascular Pressure Wire/FFR Study;  Surgeon: Nelva Bush, MD;  Location: [place] [facility];  Service: Cardiovascular;  Laterality: N/A;   CARDIAC CATHETERIZATION N/A 06/29/2016   Procedure: Coronary Balloon Angioplasty;  Surgeon: Nelva Bush, MD;  Location: [place] [facility];  Service: Cardiovascular;  Laterality: N/A;   COLON RESECTION  12/19/2011   Procedure: COLON RESECTION LAPAROSCOPIC;  Surgeon: Rolm Bookbinder, MD;  Location: [place];  Service: General;  Laterality: N/A;  laparoscopic hand assisted partial colon resection   COLON SURGERY     COLONOSCOPY     CORONARY ANGIOPLASTY WITH STENT PLACEMENT  ~ 2007   1 stent   CORONARY ARTERY BYPASS GRAFT  2007   "CABG X2"   DILATION AND CURETTAGE OF UTERUS  1973   EP IMPLANTABLE DEVICE N/A 03/25/2015   MDT Advisa DR pacemaker implanted by Dr Rayann Heman for transient complete heart block and syncope   INSERT / REPLACE / REMOVE PACEMAKER     LEFT HEART CATH AND CORS/GRAFTS ANGIOGRAPHY N/A 02/21/2018   Procedure: LEFT HEART CATH AND CORS/GRAFTS ANGIOGRAPHY;  Surgeon: Lorretta Harp, MD;  Location: [place] [facility];  Service: Cardiovascular;  Laterality: N/A;   PORT-A-CATH REMOVAL N/A 06/12/2013   Procedure: REMOVAL PORT-A-CATH;  Surgeon: Rolm Bookbinder, MD;  Location: [place];  Service: General;  Laterality: N/A;   PORTACATH PLACEMENT  06/04/2012   Procedure: INSERTION PORT-A-CATH;  Surgeon: Rolm Bookbinder, MD;  Location: [place];  Service: General;  Laterality: N/A;  Insertion of port-a-cath    POSTERIOR LAMINECTOMY / DECOMPRESSION CERVICAL SPINE  1990s   VAGINAL HYSTERECTOMY       Current Outpatient Medications  Medication Sig Dispense Refill   acetaminophen (TYLENOL) 500 MG tablet Take  500-1,000 mg by mouth daily as needed for mild pain or headache.     aspirin 81 MG chewable tablet Chew 1 tablet (81 mg total) by mouth daily.     Azelastine HCl 0.15 % SOLN Place 1 spray into both nostrils daily as needed (seasonal allergies).   12   carbamazepine (TEGRETOL XR) 200 MG 12 hr tablet Take 3 tablets (600 mg total) by mouth 2 (two) times daily. 180 tablet 11   carvedilol (COREG) 3.125 MG tablet Take 1 tablet (3.125 mg total) by mouth 2 (two) times daily. 180 tablet 3   DULoxetine (CYMBALTA) 60 MG capsule Take 60 mg by mouth daily.      ethosuximide (ZARONTIN) 250 MG capsule Take 2 in the morning, take 2 at night 120 capsule 11   ezetimibe (ZETIA) 10 MG tablet Take 1 tablet (10 mg total) by mouth daily. 90 tablet 3   ibuprofen (ADVIL,MOTRIN) 600 MG tablet Take 1 tablet (600 mg total) by mouth every  8 (eight) hours as needed. (Patient taking differently: Take 600 mg by mouth every 8 (eight) hours as needed for headache or mild pain.) 15 tablet 0   levothyroxine (SYNTHROID, LEVOTHROID) 50 MCG tablet Take 50 mcg by mouth daily.     memantine (NAMENDA) 10 MG tablet Take 1 tablet (10 mg total) by mouth 2 (two) times daily. 180 tablet 3   nitroGLYCERIN (NITROSTAT) 0.4 MG SL tablet Place 1 tablet (0.4 mg total) under the tongue every 5 (five) minutes as needed. For chest pain. 25 tablet 11   rosuvastatin (CRESTOR) 5 MG tablet Take 1 tablet by mouth up to 3 times weekly as directed 45 tablet 3   No current facility-administered medications for this visit.    Allergies:   Aspirin, Erythromycin, Lisinopril, Niacin and related, Statins, Sulfa drugs cross reactors, Tetracyclines & related, Penicillins, and Plavix [clopidogrel bisulfate]    Social History:  The patient  reports that she quit smoking about 39 years ago. Her smoking use included cigarettes. She has a 6.00 pack-year smoking history. She has never used smokeless tobacco. She reports current alcohol use of about 1.0 standard drink of  alcohol per week. She reports that she does not use drugs.   Family History:  The patient's family history includes Cancer in her mother; Heart attack in her father; Heart attack (age of onset: 46) in her brother; Heart disease in her father.    ROS:  Please see the history of present illness.   Otherwise, review of systems are positive for none.   All other systems are reviewed and negative.    PHYSICAL EXAM: VS:  BP 124/70 (BP Location: Left Arm, Patient Position: Sitting, Cuff Size: Normal)   Pulse 80   Ht 5\' 8"  (1.727 m)   Wt 174 lb (78.9 kg)   BMI 26.46 kg/m  , BMI Body mass index is 26.46 kg/m.   GEN: Well nourished, well developed, in no acute distress  HEENT: normal  Neck: no JVD, carotid bruits, or masses Cardiac: RRR; no murmurs, rubs, or gallops,no edema  Respiratory:  clear to auscultation bilaterally, normal work of breathing GI: soft, nontender, nondistended, + BS MS: no deformity or atrophy  Skin: warm and dry, no rash Neuro:  Strength and sensation are intact Psych: euthymic mood, full affect   EKG:  EKG is not ordered today. The ekg ordered today demonstrates N/A   Recent Labs: 07/12/2020: TSH 1.090 11/10/2020: Hemoglobin 13.4; Platelets 315 12/17/2020: ALT 14; BUN 14; Creatinine, Ser 0.75; Potassium 4.7; Sodium 132    Lipid Panel    Component Value Date/Time   CHOL 261 (H) 12/17/2020 0927   TRIG 141 12/17/2020 0927   HDL 86 12/17/2020 0927   CHOLHDL 3.0 12/17/2020 0927   CHOLHDL 3.2 02/21/2018 0254   VLDL 19 02/21/2018 0254   LDLCALC 151 (H) 12/17/2020 0927   Dated 09/03/19: cholesterol 283, triglycerides 238, HDL 73, LDL 166, CMET  Normal.    Wt Readings from Last 3 Encounters:  12/27/20 174 lb (78.9 kg)  12/21/20 175 lb 9.6 oz (79.7 kg)  11/10/20 180 lb (81.6 kg)      Other studies Reviewed: Additional studies/ records that were reviewed today include:   Cardiac cath 02/21/18:  LEFT HEART CATH AND CORS/GRAFTS ANGIOGRAPHY     Conclusion    Previously placed Ost RCA to Mid RCA stent (unknown type) is widely patent. Prox LAD lesion is 100% stenosed. Origin lesion is 100% stenosed. The left ventricular systolic function is normal.  LV end diastolic pressure is normal. The left ventricular ejection fraction is 55-65% by visual estimate.     Echo 07/30/20: IMPRESSIONS     1. Left ventricular ejection fraction, by estimation, is 60 to 65%. The  left ventricle has normal function. The left ventricle has no regional  wall motion abnormalities. Left ventricular diastolic parameters are  consistent with Grade I diastolic  dysfunction (impaired relaxation).   2. Right ventricular systolic function is normal. The right ventricular  size is normal.   3. The mitral valve is normal in structure. Trivial mitral valve  regurgitation. No evidence of mitral stenosis.   4. The aortic valve is tricuspid. Aortic valve regurgitation is not  visualized. Mild aortic valve sclerosis is present, with no evidence of  aortic valve stenosis.   5. The inferior vena cava is normal in size with greater than 50%  respiratory variability, suggesting right atrial pressure of 3 mmHg.   Comparison(s): Prior images unable to be directly viewed, comparison made  by report only. No significant change from prior study. 07/26/15 EF 60-65%.  GLS -18.6%.    ASSESSMENT AND PLAN:  1.  Syncope. This appears to be related to orthostatic hypotension/and/or neurocardiogenic syncope. Pacer evaluation has been normal. Recommend liberalization of sodium in diet. Maintain good hydration. Need to allow more permissive BP. Will discontinue amlodipine now. Compression hose. Will follow up in 3 months. If symptoms continue may need to stop Coreg as well. Continue to monitor BP. Encourage to always sleep with head of bed elevated.  2. CAD. S/p CABG. S/p extensive stenting of RCA. Last in 2017. Cath in 2019 was OK. No angina despite reduction in antianginal  therapy. 3. Hyperlipidemia. LDL goal < 70. Recent lab showed it was 151. On low dose crestor. Recommend PCSK 9 inhibitor. Will refer to Pharm D 4. SSS. S/p pacemaker. Followed in device clinic.  5. Memory loss. 6. History of seizures. Monitored by Dr Jannifer Franklin.  7. Chronic mild hyponatremia.    Current medicines are reviewed at length with the patient today.  The patient does not have concerns regarding medicines.  The following changes have been made:  See above.  Labs/ tests ordered today include: none No orders of the defined types were placed in this encounter.    Disposition:   FU with me in 3 months   Signed, Christyan Reger [name], MD  12/27/2020 11:32 AM    [facility] [address], [place], [place], [address] Phone [phone], Fax [phone]

## 2020-12-27 ENCOUNTER — Encounter: Payer: Self-pay | Admitting: Cardiology

## 2020-12-27 ENCOUNTER — Ambulatory Visit (INDEPENDENT_AMBULATORY_CARE_PROVIDER_SITE_OTHER): Payer: Medicare Other | Admitting: Cardiology

## 2020-12-27 ENCOUNTER — Other Ambulatory Visit: Payer: Self-pay | Admitting: Neurology

## 2020-12-27 ENCOUNTER — Other Ambulatory Visit: Payer: Self-pay

## 2020-12-27 VITALS — BP 124/70 | HR 80 | Ht 68.0 in | Wt 174.0 lb

## 2020-12-27 DIAGNOSIS — R55 Syncope and collapse: Secondary | ICD-10-CM | POA: Diagnosis not present

## 2020-12-27 DIAGNOSIS — I2510 Atherosclerotic heart disease of native coronary artery without angina pectoris: Secondary | ICD-10-CM | POA: Diagnosis not present

## 2020-12-27 DIAGNOSIS — I495 Sick sinus syndrome: Secondary | ICD-10-CM

## 2020-12-27 DIAGNOSIS — I951 Orthostatic hypotension: Secondary | ICD-10-CM | POA: Diagnosis not present

## 2020-12-27 DIAGNOSIS — E782 Mixed hyperlipidemia: Secondary | ICD-10-CM | POA: Diagnosis not present

## 2020-12-27 NOTE — Patient Instructions (Signed)
Stop taking amlodipine  Continue to monitor BP  We will have you see our Pharm D to explore lipid lowering therapy.

## 2020-12-28 ENCOUNTER — Ambulatory Visit (INDEPENDENT_AMBULATORY_CARE_PROVIDER_SITE_OTHER): Payer: Medicare Other | Admitting: Pharmacist Clinician (PhC)/ Clinical Pharmacy Specialist

## 2020-12-28 VITALS — BP 142/84 | HR 90 | Resp 14 | Ht 69.0 in | Wt 175.2 lb

## 2020-12-28 DIAGNOSIS — G720 Drug-induced myopathy: Secondary | ICD-10-CM

## 2020-12-28 DIAGNOSIS — E785 Hyperlipidemia, unspecified: Secondary | ICD-10-CM | POA: Diagnosis not present

## 2020-12-28 DIAGNOSIS — T466X5A Adverse effect of antihyperlipidemic and antiarteriosclerotic drugs, initial encounter: Secondary | ICD-10-CM

## 2020-12-28 NOTE — Progress Notes (Signed)
12/29/2020 RUCHA WISSINGER 06-26-1951 [id number]   HPI:  Rachel Clark is a 70 y.o. female patient of Dr [name], who presents today for a lipid clinic evaluation.  See pertinent past medical history below.  Patient has extensive history of ASCVD and has been unable to tolerate high intensity statin drugs.    Past Medical History: ASCVD S/P CABG x 2 -  2007 with subsequent failure of SVG to RCA; extensive stenting of RCA with most recent intervention in 2017 with PTCA of ostial RCA for restenosis  Orthostatic hypotension Has occasional 20 pt drop in BP with positional change, several syncopal epidsodes  Seizure disorder On ethosuximide, carbamazepine  Memory loss On memantine    Current Medications: ezetimibe 10 mg qd  Cholesterol Goals: LDL < 70   Intolerant/previously tried: rosuvastatin, atorvastatin both caused myalgias  Family history: father died in is early 10's from [place], both his brothers with MI's although they lived longer lives; mother died in her early 53's - smoker, emphysema; 1 sister died from dementia, one half brother (mother) had MI in his late 98's (smoker, alcohol, obesity) now in his 27's; 1 daughter good health, (66) 1 son with epilepsy and defibrillator (47)  Diet: does add salt due to orthostatic hypotension, otherwise eats good mix of meats and vegetables  Exercise:  no regular exercise, concern to be out walking d/t syncopal issues  Labs:  6/22:  TC 261, TG 141, HDL 86, LDL 151   Current Outpatient Medications  Medication Sig Dispense Refill   acetaminophen (TYLENOL) 500 MG tablet Take 500-1,000 mg by mouth daily as needed for mild pain or headache.     aspirin 81 MG chewable tablet Chew 1 tablet (81 mg total) by mouth daily.     Azelastine HCl 0.15 % SOLN Place 1 spray into both nostrils daily as needed (seasonal allergies).   12   carbamazepine (TEGRETOL XR) 200 MG 12 hr tablet Take 3 tablets (600 mg total) by mouth 2 (two) times daily. 180 tablet 11    carvedilol (COREG) 3.125 MG tablet Take 1 tablet (3.125 mg total) by mouth 2 (two) times daily. 180 tablet 3   DULoxetine (CYMBALTA) 60 MG capsule Take 60 mg by mouth daily.      ethosuximide (ZARONTIN) 250 MG capsule Take 2 in the morning, take 2 at night 120 capsule 11   ezetimibe (ZETIA) 10 MG tablet Take 1 tablet (10 mg total) by mouth daily. 90 tablet 3   levothyroxine (SYNTHROID, LEVOTHROID) 50 MCG tablet Take 50 mcg by mouth daily.     memantine (NAMENDA) 10 MG tablet Take 1 tablet (10 mg total) by mouth 2 (two) times daily. 180 tablet 3   nitroGLYCERIN (NITROSTAT) 0.4 MG SL tablet Place 1 tablet (0.4 mg total) under the tongue every 5 (five) minutes as needed. For chest pain. 25 tablet 11   tiZANidine (ZANAFLEX) 2 MG tablet Take by mouth every 6 (six) hours as needed for muscle spasms.     No current facility-administered medications for this visit.    Allergies  Allergen Reactions   Aspirin Other (See Comments)    GI upset- can tolerate 81 mg ASA, just not full doses   Capecitabine     Other reaction(s): diarrhea   Crestor [Rosuvastatin]     myalgias   Erythromycin Nausea Only    Gi upset   Fluticasone     Other reaction(s): sore throat   Lipitor [Atorvastatin]     myalgias   Lisinopril  Cough   Niacin And Related Other (See Comments)    Pt does not recall reactions    Statins Other (See Comments)    Muscle pain   Sulfa Drugs Cross Reactors Swelling   Sulfamethoxazole     Other reaction(s): anaphylaxis   Tetracyclines & Related Nausea Only   Penicillins Nausea Only and Rash    Has patient had a PCN reaction causing immediate rash, facial/tongue/throat swelling, SOB or lightheadedness with hypotension: Yes Has patient had a PCN reaction causing severe rash involving mucus membranes or skin necrosis: No Has patient had a PCN reaction that required hospitalization No Has patient had a PCN reaction occurring within the last 10 years: No If all of the above answers are  "NO", then may proceed with Cephalosporin use.    Plavix [Clopidogrel Bisulfate] Rash    Past Medical History:  Diagnosis Date   Arthritis    "fingers" (06/29/2016)   Chronic lower back pain    Colon cancer ([place]) 12/04/2011   s/p Laparoscopic-assisted transverse colectomy on 12/19/2011 by Dr. Donne Hazel.  pT3 N0 M0.    Coronary artery disease    a. remote NSTEMI with PCI to the RCA; b. S/P CABG in 2007; c. 2010 - 2 v CAD with L-LAD and S-RCA patent, small caliber Dx 70% (treated medically - no amenable to PCI), CFX free of significant disease, normal LVF. d. 10/2014 Lexi MV: EF 61%, no ischemia/infarct.   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 rate 14, height 5\' 9"  (1.753 m), weight 175 lb 3.2 oz (79.5 kg), SpO2 96 %.   Dyslipidemia Patient with ASCVD and hyperlipidemia, unable to tolerate multiple statin drugs.  She is currently on only ezetimibe, with an LDL of 151.  Reviewed options for lowering LDL cholesterol, including PCSK-9 inhibitors, bempedoic acid and inclisiran.  Discussed mechanisms of action, dosing, side effects and potential decreases in LDL cholesterol.  Answered all patient questions.  Based on this information, patient would prefer to start bempedoic acid.  She currently has some ezetimibe at home, so samples for 3 weeks of Nexletol were given.  Will start PA for Nexlizet, and once approved,  she is aware to discontinue ezetimibe tablets.  She should have repeat lipid labs drawn 2-3 months after starting medication.      Tommy Medal PharmD CPP [facility] [address] [place], [place] [address] [phone]

## 2020-12-28 NOTE — Patient Instructions (Signed)
Your Results:             Your most recent labs Goal  Total Cholesterol 261 < 200  Triglycerides 141 < 150  HDL (happy/good cholesterol) 86 > 40  LDL (lousy/bad cholesterol 151 < 70      Medication changes:  Start Nexletol 180 mg once daily.  (Samples).  Rachel Clark will start the Prior authorization process to get Nexlizet (Nexlitol + ezetimibe) covered by your insurance.  Once you start that, please stop taking the ezetimibe tablets.    Lab orders:  We will need to repeat cholesterol labs after 2-3 months of taking the new medication.  We will send you a reminder closer to that time.    Patient Assistance:  The [employer] foundation offers assistance to help pay for medication copays.  They will cover copays for all cholesterol lowering meds, including statins, fibrates, omega-3 oils, ezetimibe, Repatha, Praluent, Nexletol, Nexlizet.  The cards are usually good for $2,500 or 12 months, whichever comes first. Go to healthwellfoundation.org Click on "Apply Now" Answer questions as to whom is applying (patient or representative) Your disease fund will be "hypercholesterolemia - Medicare access" They will ask questions about finances and which medications you are taking for cholesterol When you submit, the approval is usually within minutes.  You will need to print the card information from the site You will need to show this information to your pharmacy, they will bill your Medicare Part D plan first -then bill [employer] --for the copay.   You can also call them at [phone], although the hold times can be quite long.   Thank you for choosing [facility]

## 2020-12-29 ENCOUNTER — Telehealth: Payer: Self-pay

## 2020-12-29 DIAGNOSIS — I10 Essential (primary) hypertension: Secondary | ICD-10-CM

## 2020-12-29 DIAGNOSIS — E785 Hyperlipidemia, unspecified: Secondary | ICD-10-CM

## 2020-12-29 MED ORDER — NEXLIZET 180-10 MG PO TABS: 180.0000 mg | ORAL_TABLET | Freq: Every day | ORAL | 3 refills | Status: DC

## 2020-12-29 MED ORDER — NEXLETOL 180 MG PO TABS: 180.0000 mg | ORAL_TABLET | Freq: Every day | ORAL | 0 refills | Status: DC

## 2020-12-29 NOTE — Telephone Encounter (Signed)
-----   Message from Rockne Menghini, [place] sent at 12/29/2020  8:44 AM EDT ----- Can you please do a PA for Nexlizet?   Once approved, take nexlitol and ezetimibe off med list and order Nexlizet.    Thank you! K

## 2020-12-29 NOTE — Telephone Encounter (Signed)
Called and spoke w/pt's husband and stated that we got pa approved for the combo pill of nexletol and zetia (NEXLIZET), rx sent, instructed them to have pt complete bmet and uric acid in 2 weeks, then lipid and hepatic panel (fasting) in 3 months. Pt's husband voiced understanding.

## 2020-12-29 NOTE — Assessment & Plan Note (Signed)
Patient with ASCVD and hyperlipidemia, unable to tolerate multiple statin drugs.  She is currently on only ezetimibe, with an LDL of 151.  Reviewed options for lowering LDL cholesterol, including PCSK-9 inhibitors, bempedoic acid and inclisiran.  Discussed mechanisms of action, dosing, side effects and potential decreases in LDL cholesterol.  Answered all patient questions.  Based on this information, patient would prefer to start bempedoic acid.  She currently has some ezetimibe at home, so samples for 3 weeks of Nexletol were given.  Will start PA for Nexlizet, and once approved, she is aware to discontinue ezetimibe tablets.  She should have repeat lipid labs drawn 2-3 months after starting medication.

## 2021-01-03 ENCOUNTER — Other Ambulatory Visit: Payer: Self-pay | Admitting: Cardiology

## 2021-01-04 ENCOUNTER — Ambulatory Visit (HOSPITAL_COMMUNITY)
Admission: RE | Admit: 2021-01-04 | Discharge: 2021-01-04 | Disposition: A | Discharge status: Home / Self Care | Payer: Medicare Other | Source: Ambulatory Visit | Attending: Neurology | Admitting: Neurology

## 2021-01-04 ENCOUNTER — Other Ambulatory Visit: Payer: Self-pay

## 2021-01-04 ENCOUNTER — Telehealth: Payer: Self-pay | Admitting: Neurology

## 2021-01-04 DIAGNOSIS — R55 Syncope and collapse: Secondary | ICD-10-CM | POA: Insufficient documentation

## 2021-01-04 NOTE — Progress Notes (Signed)
Carotid artery duplex completed. Refer to "CV Proc" under chart review to view preliminary results.  01/04/2021 10:38 AM Kelby Aline., MHA, RVT, RDCS, RDMS

## 2021-01-04 NOTE — Telephone Encounter (Signed)
  I called the patient and talk with the husband.  The carotid Doppler studies showed no abnormalities in the carotid circulation, antegrade flow in the vertebral arteries bilaterally, more resistance on the left, may be a difference in size of vertebral arteries.  Subclavian arteries were normal.  Carotid doppler 01/04/21:  Summary: Right Carotid: Velocities in the right ICA are consistent with a 1-39% stenosis.   Left Carotid: Velocities in the left ICA are consistent with a 1-39% stenosis.   Vertebrals:  Bilateral vertebral arteries demonstrate antegrade flow. Left              vertebral artery demonstrates high resistant flow. Subclavians: Normal flow hemodynamics were seen in bilateral subclavian              arteries.

## 2021-01-07 ENCOUNTER — Telehealth: Payer: Self-pay | Admitting: Hematology and Oncology

## 2021-01-07 ENCOUNTER — Inpatient Hospital Stay: Payer: Medicare Other

## 2021-01-07 NOTE — Telephone Encounter (Signed)
R/s appt per 7/8 sch msg. Pt's husband is aware.

## 2021-01-10 ENCOUNTER — Other Ambulatory Visit: Payer: Self-pay

## 2021-01-10 ENCOUNTER — Inpatient Hospital Stay: Payer: Medicare Other | Attending: Hematology and Oncology

## 2021-01-10 DIAGNOSIS — D649 Anemia, unspecified: Secondary | ICD-10-CM | POA: Insufficient documentation

## 2021-01-10 DIAGNOSIS — Z888 Allergy status to other drugs, medicaments and biological substances status: Secondary | ICD-10-CM | POA: Diagnosis not present

## 2021-01-10 DIAGNOSIS — Z79899 Other long term (current) drug therapy: Secondary | ICD-10-CM | POA: Insufficient documentation

## 2021-01-10 DIAGNOSIS — D472 Monoclonal gammopathy: Secondary | ICD-10-CM | POA: Diagnosis not present

## 2021-01-10 DIAGNOSIS — Z880 Allergy status to penicillin: Secondary | ICD-10-CM | POA: Diagnosis not present

## 2021-01-10 DIAGNOSIS — Z886 Allergy status to analgesic agent status: Secondary | ICD-10-CM | POA: Diagnosis not present

## 2021-01-10 DIAGNOSIS — Z881 Allergy status to other antibiotic agents status: Secondary | ICD-10-CM | POA: Insufficient documentation

## 2021-01-10 DIAGNOSIS — Z882 Allergy status to sulfonamides status: Secondary | ICD-10-CM | POA: Insufficient documentation

## 2021-01-10 DIAGNOSIS — Z85038 Personal history of other malignant neoplasm of large intestine: Secondary | ICD-10-CM | POA: Insufficient documentation

## 2021-01-10 LAB — CBC WITH DIFFERENTIAL/PLATELET
Abs Immature Granulocytes: 0.01 10*3/uL (ref 0.00–0.07)
Basophils Absolute: 0 10*3/uL (ref 0.0–0.1)
Basophils Relative: 1 %
Eosinophils Absolute: 0.2 10*3/uL (ref 0.0–0.5)
Eosinophils Relative: 3 %
HCT: 40 % (ref 36.0–46.0)
Hemoglobin: 13.8 g/dL (ref 12.0–15.0)
Immature Granulocytes: 0 %
Lymphocytes Relative: 30 %
Lymphs Abs: 1.9 10*3/uL (ref 0.7–4.0)
MCH: 30.2 pg (ref 26.0–34.0)
MCHC: 34.5 g/dL (ref 30.0–36.0)
MCV: 87.5 fL (ref 80.0–100.0)
Monocytes Absolute: 0.4 10*3/uL (ref 0.1–1.0)
Monocytes Relative: 7 %
Neutro Abs: 3.7 10*3/uL (ref 1.7–7.7)
Neutrophils Relative %: 59 %
Platelets: 307 10*3/uL (ref 150–400)
RBC: 4.57 MIL/uL (ref 3.87–5.11)
RDW: 12.6 % (ref 11.5–15.5)
WBC: 6.3 10*3/uL (ref 4.0–10.5)
nRBC: 0 % (ref 0.0–0.2)

## 2021-01-10 LAB — COMPREHENSIVE METABOLIC PANEL
ALT: 8 U/L (ref 0–44)
AST: 9 U/L — ABNORMAL LOW (ref 15–41)
Albumin: 3.4 g/dL — ABNORMAL LOW (ref 3.5–5.0)
Alkaline Phosphatase: 78 U/L (ref 38–126)
Anion gap: 8 (ref 5–15)
BUN: 12 mg/dL (ref 8–23)
CO2: 27 mmol/L (ref 22–32)
Calcium: 8.8 mg/dL — ABNORMAL LOW (ref 8.9–10.3)
Chloride: 100 mmol/L (ref 98–111)
Creatinine, Ser: 0.77 mg/dL (ref 0.44–1.00)
GFR, Estimated: >60 mL/min (ref >60)
Glucose, Bld: 117 mg/dL — ABNORMAL HIGH (ref 70–99)
Potassium: 4.2 mmol/L (ref 3.5–5.1)
Sodium: 135 mmol/L (ref 135–145)
Total Bilirubin: 0.4 mg/dL (ref 0.3–1.2)
Total Protein: 7 g/dL (ref 6.5–8.1)

## 2021-01-11 LAB — KAPPA/LAMBDA LIGHT CHAINS
Kappa free light chain: 30.5 mg/L — ABNORMAL HIGH (ref 3.3–19.4)
Kappa, lambda light chain ratio: 1.8 — ABNORMAL HIGH (ref 0.26–1.65)
Lambda free light chains: 16.9 mg/L (ref 5.7–26.3)

## 2021-01-14 ENCOUNTER — Encounter: Payer: Self-pay | Admitting: Hematology and Oncology

## 2021-01-14 ENCOUNTER — Other Ambulatory Visit: Payer: Self-pay

## 2021-01-14 ENCOUNTER — Inpatient Hospital Stay (HOSPITAL_BASED_OUTPATIENT_CLINIC_OR_DEPARTMENT_OTHER): Payer: Medicare Other | Admitting: Hematology and Oncology

## 2021-01-14 VITALS — BP 131/74 | HR 88 | Temp 97.6°F | Resp 15 | Ht 69.0 in | Wt 173.6 lb

## 2021-01-14 DIAGNOSIS — Z85038 Personal history of other malignant neoplasm of large intestine: Secondary | ICD-10-CM

## 2021-01-14 DIAGNOSIS — Z299 Encounter for prophylactic measures, unspecified: Secondary | ICD-10-CM

## 2021-01-14 DIAGNOSIS — Z79899 Other long term (current) drug therapy: Secondary | ICD-10-CM | POA: Diagnosis not present

## 2021-01-14 DIAGNOSIS — D472 Monoclonal gammopathy: Secondary | ICD-10-CM

## 2021-01-14 DIAGNOSIS — D649 Anemia, unspecified: Secondary | ICD-10-CM | POA: Diagnosis not present

## 2021-01-14 DIAGNOSIS — Z880 Allergy status to penicillin: Secondary | ICD-10-CM | POA: Diagnosis not present

## 2021-01-14 LAB — MULTIPLE MYELOMA PANEL, SERUM
Albumin SerPl Elph-Mcnc: 3.6 g/dL (ref 2.9–4.4)
Albumin/Glob SerPl: 1.3 (ref 0.7–1.7)
Alpha 1: 0.3 g/dL (ref 0.0–0.4)
Alpha2 Glob SerPl Elph-Mcnc: 0.7 g/dL (ref 0.4–1.0)
B-Globulin SerPl Elph-Mcnc: 0.8 g/dL (ref 0.7–1.3)
Gamma Glob SerPl Elph-Mcnc: 1.1 g/dL (ref 0.4–1.8)
Globulin, Total: 2.9 g/dL (ref 2.2–3.9)
IgA: 172 mg/dL (ref 64–422)
IgG (Immunoglobin G), Serum: 1132 mg/dL (ref 586–1602)
IgM (Immunoglobulin M), Srm: 103 mg/dL (ref 26–217)
M Protein SerPl Elph-Mcnc: 0.4 g/dL — ABNORMAL HIGH
Total Protein ELP: 6.5 g/dL (ref 6.0–8.5)

## 2021-01-14 NOTE — Assessment & Plan Note (Signed)
Her last colonoscopy done in January 2018 She is a long-term cancer survivor and does not need follow-up imaging or blood work in this regard from medical oncology stand point

## 2021-01-14 NOTE — Assessment & Plan Note (Signed)
Unfortunately, due to her appointment being moved sooner, myeloma panel is still pending Her light chains are stable She has no signs of anemia, hypercalcemia or renal failure We will monitor closely and I will call her next week with results We will continue to see her once a year

## 2021-01-14 NOTE — Progress Notes (Signed)
[facility] OFFICE PROGRESS NOTE  Patient Care Team: White, Rachal, MD as PCP - General (Family Medicine) Jordan, Peter M, MD as PCP - Cardiology (Cardiology) Schooler, Vincent, MD as Consulting Physician (Gastroenterology)  ASSESSMENT & PLAN:  History of colon cancer Her last colonoscopy done in January 2018 She is a long-term cancer survivor and does not need follow-up imaging or blood work in this regard from medical oncology stand point  Monoclonal gammopathy of unknown significance Unfortunately, due to her appointment being moved sooner, myeloma panel is still pending Her light chains are stable She has no signs of anemia, hypercalcemia or renal failure We will monitor closely and I will call her next week with results We will continue to see her once a year  Preventive measure She has mild hypocalcemia We discussed importance of taking vitamin D and calcium supplement for bone health  Orders Placed This Encounter  Procedures   CBC with Differential/Platelet    Standing Status:   Standing    Number of Occurrences:   22    Standing Expiration Date:   01/14/2022   Comprehensive metabolic panel    Standing Status:   Standing    Number of Occurrences:   33    Standing Expiration Date:   01/14/2022   Kappa/lambda light chains    Standing Status:   Standing    Number of Occurrences:   22    Standing Expiration Date:   01/14/2022   Multiple Myeloma Panel (SPEP&IFE w/QIG)    Standing Status:   Standing    Number of Occurrences:   22    Standing Expiration Date:   01/14/2022    All questions were answered. The patient knows to call the clinic with any problems, questions or concerns. The total time spent in the appointment was 20 minutes encounter with patients including review of chart and various tests results, discussions about plan of care and coordination of care plan    , MD 01/14/2021 12:21 PM  INTERVAL HISTORY: Please see below for problem  oriented charting. She returns with her husband Her husband provided most of the story She had recent syncopal episode with medication adjustment by cardiologist No new bone pain Denies recurrent infection  SUMMARY OF ONCOLOGIC HISTORY: Oncology History  History of colon cancer  12/04/2011 Initial Diagnosis   Colon cancer    11/03/2014 Imaging   CT scan showed no evidence of cancer recurrence.     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 The patient have very minimal M spike and asymptomatic. She is observed #2 T3, N0, M0 colon cancer This was discovered when the patient presented with severe anemia. Colonoscopy revealed abnormal lesion and in 12/19/2011 Dr. Wakefield perform laparoscopic-assisted transverse colectomy. She received adjuvant Xeloda in August 2013 and switched to IV 5-FU in December 2013 due to poor tolerance to Xeloda. Her last chemotherapy is completed by March 2014. She has normal colonoscopy in July 2014. CT scan from 10/29/2013 show no evidence of recurrence.  REVIEW OF SYSTEMS:   Constitutional: Denies fevers, chills or abnormal weight loss Eyes: Denies blurriness of vision Ears, nose, mouth, throat, and face: Denies mucositis or sore throat Respiratory: Denies cough, dyspnea or wheezes Cardiovascular: Denies palpitation, chest discomfort or lower extremity swelling Gastrointestinal:  Denies nausea, heartburn or change in bowel habits Skin: Denies abnormal skin rashes Lymphatics: Denies new lymphadenopathy or easy bruising Neurological:Denies numbness, tingling or new weaknesses Behavioral/Psych: Mood is stable, no new changes  All other systems   were reviewed with the patient and are negative.  I have reviewed the past medical history, past surgical history, social history and family history with the patient and they are unchanged from previous note.  ALLERGIES:  is allergic to aspirin, capecitabine, crestor [rosuvastatin], erythromycin, fluticasone,  lipitor [atorvastatin], lisinopril, niacin and related, statins, sulfa drugs cross reactors, sulfamethoxazole, tetracyclines & related, penicillins, and plavix [clopidogrel bisulfate].  MEDICATIONS:  Current Outpatient Medications  Medication Sig Dispense Refill   acetaminophen (TYLENOL) 500 MG tablet Take 500-1,000 mg by mouth daily as needed for mild pain or headache.     aspirin 81 MG chewable tablet Chew 1 tablet (81 mg total) by mouth daily.     Azelastine HCl 0.15 % SOLN Place 1 spray into both nostrils daily as needed (seasonal allergies).   12   Bempedoic Acid-Ezetimibe (NEXLIZET) 180-10 MG TABS Take 180 mg by mouth daily. 90 tablet 3   carbamazepine (TEGRETOL XR) 200 MG 12 hr tablet Take 3 tablets (600 mg total) by mouth 2 (two) times daily. 180 tablet 11   carvedilol (COREG) 3.125 MG tablet Take 1 tablet (3.125 mg total) by mouth 2 (two) times daily. 180 tablet 3   DULoxetine (CYMBALTA) 60 MG capsule Take 60 mg by mouth daily.      ethosuximide (ZARONTIN) 250 MG capsule Take 2 in the morning, take 2 at night 120 capsule 11   levothyroxine (SYNTHROID, LEVOTHROID) 50 MCG tablet Take 50 mcg by mouth daily.     memantine (NAMENDA) 10 MG tablet Take 1 tablet (10 mg total) by mouth 2 (two) times daily. 180 tablet 3   nitroGLYCERIN (NITROSTAT) 0.4 MG SL tablet Place 1 tablet (0.4 mg total) under the tongue every 5 (five) minutes as needed. For chest pain. 25 tablet 11   tiZANidine (ZANAFLEX) 2 MG tablet Take by mouth every 6 (six) hours as needed for muscle spasms.     No current facility-administered medications for this visit.    PHYSICAL EXAMINATION: ECOG PERFORMANCE STATUS: 1 - Symptomatic but completely ambulatory  Vitals:   01/14/21 1108  BP: 131/74  Pulse: 88  Resp: 15  Temp: 97.6 F (36.4 C)  SpO2: 100%   Filed Weights   01/14/21 1108  Weight: 173 lb 9.6 oz (78.7 kg)    GENERAL:alert, no distress and comfortable SKIN: skin color, texture, turgor are normal, no rashes  or significant lesions EYES: normal, Conjunctiva are pink and non-injected, sclera clear OROPHARYNX:no exudate, no erythema and lips, buccal mucosa, and tongue normal  NECK: supple, thyroid normal size, non-tender, without nodularity LYMPH:  no palpable lymphadenopathy in the cervical, axillary or inguinal LUNGS: clear to auscultation and percussion with normal breathing effort HEART: regular rate & rhythm and no murmurs and no lower extremity edema ABDOMEN:abdomen soft, non-tender and normal bowel sounds Musculoskeletal:no cyanosis of digits and no clubbing  NEURO: alert & oriented x 3 with fluent speech, no focal motor/sensory deficits  LABORATORY DATA:  I have reviewed the data as listed    Component Value Date/Time   NA 135 01/10/2021 0955   NA 132 (L) 12/17/2020 0927   NA 137 07/11/2016 0820   K 4.2 01/10/2021 0955   K 4.4 07/11/2016 0820   CL 100 01/10/2021 0955   CL 106 12/25/2012 0924   CO2 27 01/10/2021 0955   CO2 27 07/11/2016 0820   GLUCOSE 117 (H) 01/10/2021 0955   GLUCOSE 115 07/11/2016 0820   GLUCOSE 105 (H) 12/25/2012 0924   BUN 12 01/10/2021 0955  BUN 14 12/17/2020 0927   BUN 15.6 07/11/2016 0820   CREATININE 0.77 01/10/2021 0955   CREATININE 0.8 07/11/2016 0820   CALCIUM 8.8 (L) 01/10/2021 0955   CALCIUM 8.9 07/11/2016 0820   PROT 7.0 01/10/2021 0955   PROT 6.7 12/17/2020 0927   PROT 7.1 07/11/2016 0820   ALBUMIN 3.4 (L) 01/10/2021 0955   ALBUMIN 4.2 12/17/2020 0927   ALBUMIN 3.6 07/11/2016 0820   AST 9 (L) 01/10/2021 0955   AST 13 07/11/2016 0820   ALT 8 01/10/2021 0955   ALT 14 07/11/2016 0820   ALKPHOS 78 01/10/2021 0955   ALKPHOS 89 07/11/2016 0820   BILITOT 0.4 01/10/2021 0955   BILITOT 0.2 12/17/2020 0927   BILITOT 0.29 07/11/2016 0820   GFRNONAA >60 01/10/2021 0955   GFRAA 97 08/17/2020 1111    No results found for: SPEP, UPEP  Lab Results  Component Value Date   WBC 6.3 01/10/2021   NEUTROABS 3.7 01/10/2021   HGB 13.8 01/10/2021    HCT 40.0 01/10/2021   MCV 87.5 01/10/2021   PLT 307 01/10/2021      Chemistry      Component Value Date/Time   NA 135 01/10/2021 0955   NA 132 (L) 12/17/2020 0927   NA 137 07/11/2016 0820   K 4.2 01/10/2021 0955   K 4.4 07/11/2016 0820   CL 100 01/10/2021 0955   CL 106 12/25/2012 0924   CO2 27 01/10/2021 0955   CO2 27 07/11/2016 0820   BUN 12 01/10/2021 0955   BUN 14 12/17/2020 0927   BUN 15.6 07/11/2016 0820   CREATININE 0.77 01/10/2021 0955   CREATININE 0.8 07/11/2016 0820      Component Value Date/Time   CALCIUM 8.8 (L) 01/10/2021 0955   CALCIUM 8.9 07/11/2016 0820   ALKPHOS 78 01/10/2021 0955   ALKPHOS 89 07/11/2016 0820   AST 9 (L) 01/10/2021 0955   AST 13 07/11/2016 0820   ALT 8 01/10/2021 0955   ALT 14 07/11/2016 0820   BILITOT 0.4 01/10/2021 0955   BILITOT 0.2 12/17/2020 0927   BILITOT 0.29 07/11/2016 0820       RADIOGRAPHIC STUDIES: I have personally reviewed the radiological images as listed and agreed with the findings in the report. VAS US CAROTID  Result Date: 01/04/2021 Carotid Arterial Duplex Study Patient Name:  LEANZA SHEPPERSON  Date of Exam:   01/04/2021 Medical Rec #: [id number]          Accession #:    [id number] Date of Birth: 05-17-1951         Patient Gender: F Patient Age:   69Y Exam Location:  [facility] Procedure:      VAS US CAROTID Referring Phys: Sallye Lat CHARLES K WILLIS --------------------------------------------------------------------------------  Indications:       Syncope. Risk Factors:      Hyperlipidemia, coronary artery disease. Other Factors:     Orthostatic hypotension. Comparison Study:  No prior study Performing Technologist: Maudry Mayhew MHA, RDMS, RVT, RDCS  Examination Guidelines: A complete evaluation includes B-mode imaging, spectral Doppler, color Doppler, and power Doppler as needed of all accessible portions of each vessel. Bilateral testing is considered an integral part of a complete examination. Limited  examinations for reoccurring indications may be performed as noted.  Right Carotid Findings: +----------+-------+-------+--------+---------------------------------+--------+           PSV    EDV    StenosisPlaque Description               Comments  cm/s   cm/s                                                     +----------+-------+-------+--------+---------------------------------+--------+ CCA Prox  76     11             heterogenous                              +----------+-------+-------+--------+---------------------------------+--------+ CCA Distal80     18                                                       +----------+-------+-------+--------+---------------------------------+--------+ ICA Prox  88     14             heterogenous, calcific and                                                irregular                                 +----------+-------+-------+--------+---------------------------------+--------+ ICA Distal91     21                                                       +----------+-------+-------+--------+---------------------------------+--------+ ECA       122    7              heterogenous, irregular and                                               calcific                                  +----------+-------+-------+--------+---------------------------------+--------+ +----------+--------+-------+----------------+-------------------+           PSV cm/sEDV cmsDescribe        Arm Pressure (mmHG) +----------+--------+-------+----------------+-------------------+ Subclavian114            Multiphasic, WNL                    +----------+--------+-------+----------------+-------------------+ +---------+--------+--+--------+--+---------+ VertebralPSV cm/s46EDV cm/s11Antegrade +---------+--------+--+--------+--+---------+  Left Carotid Findings:  +----------+--------+-------+--------+----------------------+------------------+           PSV cm/sEDV    StenosisPlaque Description    Comments                             cm/s                                                    +----------+--------+-------+--------+----------------------+------------------+   CCA Prox  73      11                                   intimal thickening +----------+--------+-------+--------+----------------------+------------------+ CCA Distal76      12             smooth and                                                                heterogenous                             +----------+--------+-------+--------+----------------------+------------------+ ICA Prox  85      16             smooth and                                                                heterogenous                             +----------+--------+-------+--------+----------------------+------------------+ ICA Distal112     27                                                      +----------+--------+-------+--------+----------------------+------------------+ ECA       105     8                                                       +----------+--------+-------+--------+----------------------+------------------+ +----------+--------+--------+----------------+-------------------+           PSV cm/sEDV cm/sDescribe        Arm Pressure (mmHG) +----------+--------+--------+----------------+-------------------+ Subclavian142             Multiphasic, WNL                    +----------+--------+--------+----------------+-------------------+ +---------+--------+--+--------+-+----------------------------+ VertebralPSV cm/s33EDV cm/s7High resistant and Antegrade +---------+--------+--+--------+-+----------------------------+   Summary: Right Carotid: Velocities in the right ICA are consistent with a 1-39% stenosis. Left Carotid: Velocities in  the left ICA are consistent with a 1-39% stenosis. Vertebrals:  Bilateral vertebral arteries demonstrate antegrade flow. Left              vertebral artery demonstrates high resistant flow. Subclavians: Normal flow hemodynamics were seen in bilateral subclavian              arteries. *See table(s) above for measurements and observations.  Electronically signed by Pramod Sethi MD on 01/04/2021 at 2:08:13 PM.    Final     

## 2021-01-14 NOTE — Assessment & Plan Note (Signed)
She has mild hypocalcemia We discussed importance of taking vitamin D and calcium supplement for bone health

## 2021-01-17 ENCOUNTER — Other Ambulatory Visit: Payer: Self-pay | Admitting: Neurology

## 2021-01-17 ENCOUNTER — Telehealth: Payer: Self-pay

## 2021-01-17 NOTE — Telephone Encounter (Signed)
-----   Message from Heath Lark, MD sent at 01/17/2021  8:14 AM EDT ----- Pls let her know myeloma panel is stable

## 2021-01-17 NOTE — Telephone Encounter (Signed)
Called and given below message to husband. He verbalized understanding. 

## 2021-01-19 ENCOUNTER — Other Ambulatory Visit: Payer: Self-pay | Admitting: Neurology

## 2021-01-19 ENCOUNTER — Telehealth: Payer: Self-pay | Admitting: Neurology

## 2021-01-19 DIAGNOSIS — E785 Hyperlipidemia, unspecified: Secondary | ICD-10-CM | POA: Diagnosis not present

## 2021-01-19 DIAGNOSIS — I10 Essential (primary) hypertension: Secondary | ICD-10-CM | POA: Diagnosis not present

## 2021-01-19 LAB — BASIC METABOLIC PANEL
BUN/Creatinine Ratio: 26 (ref 12–28)
BUN: 14 mg/dL (ref 8–27)
CO2: 22 mmol/L (ref 20–29)
Calcium: 9 mg/dL (ref 8.7–10.3)
Chloride: 93 mmol/L — ABNORMAL LOW (ref 96–106)
Creatinine, Ser: 0.53 mg/dL — ABNORMAL LOW (ref 0.57–1.00)
Glucose: 105 mg/dL — ABNORMAL HIGH (ref 65–99)
Potassium: 4.7 mmol/L (ref 3.5–5.2)
Sodium: 132 mmol/L — ABNORMAL LOW (ref 134–144)
eGFR: 99 mL/min/{1.73_m2} (ref >59)

## 2021-01-19 LAB — HEPATIC FUNCTION PANEL
ALT: 8 IU/L (ref 0–32)
AST: 7 IU/L (ref 0–40)
Albumin: 4.2 g/dL (ref 3.7–4.7)
Alkaline Phosphatase: 90 IU/L (ref 44–121)
Bilirubin Total: 0.2 mg/dL (ref 0.0–1.2)
Bilirubin, Direct: <0.1 mg/dL (ref 0.00–0.40)
Total Protein: 6.8 g/dL (ref 6.0–8.5)

## 2021-01-19 LAB — LIPID PANEL
Chol/HDL Ratio: 2.1 ratio (ref 0.0–4.4)
Cholesterol, Total: 176 mg/dL (ref 100–199)
HDL: 82 mg/dL (ref >39)
LDL Chol Calc (NIH): 52 mg/dL (ref 0–99)
Triglycerides: 276 mg/dL — ABNORMAL HIGH (ref 0–149)
VLDL Cholesterol Cal: 42 mg/dL — ABNORMAL HIGH (ref 5–40)

## 2021-01-19 LAB — URIC ACID: Uric Acid: 3.5 mg/dL (ref 3.1–7.9)

## 2021-01-19 MED ORDER — CARBAMAZEPINE ER 200 MG PO TB12: 600.0000 mg | ORAL_TABLET | Freq: Two times a day (BID) | ORAL | 11 refills | Status: DC

## 2021-01-19 NOTE — Telephone Encounter (Signed)
Chart reviewed medication refill is appropriate. Refill has been sent to [facility] as requested.

## 2021-01-19 NOTE — Telephone Encounter (Signed)
Pt requesting refill for carbamazepine (TEGRETOL XR) 200 MG 12 hr tablet. Pharmacy [id number].

## 2021-01-25 ENCOUNTER — Telehealth: Payer: Self-pay

## 2021-01-25 NOTE — Telephone Encounter (Signed)
Lmom to call back for results

## 2021-03-08 ENCOUNTER — Emergency Department (HOSPITAL_COMMUNITY): Payer: Medicare Other

## 2021-03-08 ENCOUNTER — Encounter (HOSPITAL_COMMUNITY): Payer: Self-pay | Admitting: Emergency Medicine

## 2021-03-08 ENCOUNTER — Other Ambulatory Visit: Payer: Self-pay

## 2021-03-08 ENCOUNTER — Observation Stay (HOSPITAL_COMMUNITY)
Admission: EM | Admit: 2021-03-08 | Discharge: 2021-03-09 | Disposition: A | Discharge status: Home / Self Care | Payer: Medicare Other | Attending: Cardiology | Admitting: Cardiology

## 2021-03-08 ENCOUNTER — Ambulatory Visit (HOSPITAL_COMMUNITY)
Admission: EM | Disposition: A | Discharge status: Home / Self Care | Payer: Self-pay | Source: Home / Self Care | Attending: Emergency Medicine

## 2021-03-08 DIAGNOSIS — Z955 Presence of coronary angioplasty implant and graft: Secondary | ICD-10-CM

## 2021-03-08 DIAGNOSIS — R079 Chest pain, unspecified: Secondary | ICD-10-CM

## 2021-03-08 DIAGNOSIS — I2510 Atherosclerotic heart disease of native coronary artery without angina pectoris: Principal | ICD-10-CM | POA: Insufficient documentation

## 2021-03-08 DIAGNOSIS — Z87891 Personal history of nicotine dependence: Secondary | ICD-10-CM | POA: Insufficient documentation

## 2021-03-08 DIAGNOSIS — Z79899 Other long term (current) drug therapy: Secondary | ICD-10-CM | POA: Insufficient documentation

## 2021-03-08 DIAGNOSIS — I25110 Atherosclerotic heart disease of native coronary artery with unstable angina pectoris: Secondary | ICD-10-CM | POA: Diagnosis not present

## 2021-03-08 DIAGNOSIS — I25710 Atherosclerosis of autologous vein coronary artery bypass graft(s) with unstable angina pectoris: Secondary | ICD-10-CM | POA: Diagnosis not present

## 2021-03-08 DIAGNOSIS — E039 Hypothyroidism, unspecified: Secondary | ICD-10-CM | POA: Insufficient documentation

## 2021-03-08 DIAGNOSIS — Z85038 Personal history of other malignant neoplasm of large intestine: Secondary | ICD-10-CM | POA: Insufficient documentation

## 2021-03-08 DIAGNOSIS — E785 Hyperlipidemia, unspecified: Secondary | ICD-10-CM | POA: Diagnosis present

## 2021-03-08 DIAGNOSIS — R0789 Other chest pain: Secondary | ICD-10-CM | POA: Diagnosis not present

## 2021-03-08 DIAGNOSIS — R0902 Hypoxemia: Secondary | ICD-10-CM | POA: Diagnosis not present

## 2021-03-08 DIAGNOSIS — I499 Cardiac arrhythmia, unspecified: Secondary | ICD-10-CM | POA: Diagnosis not present

## 2021-03-08 DIAGNOSIS — Z20822 Contact with and (suspected) exposure to covid-19: Secondary | ICD-10-CM | POA: Diagnosis not present

## 2021-03-08 DIAGNOSIS — I214 Non-ST elevation (NSTEMI) myocardial infarction: Secondary | ICD-10-CM | POA: Diagnosis not present

## 2021-03-08 DIAGNOSIS — Z951 Presence of aortocoronary bypass graft: Secondary | ICD-10-CM | POA: Diagnosis not present

## 2021-03-08 DIAGNOSIS — Z7982 Long term (current) use of aspirin: Secondary | ICD-10-CM | POA: Diagnosis not present

## 2021-03-08 DIAGNOSIS — Z950 Presence of cardiac pacemaker: Secondary | ICD-10-CM | POA: Diagnosis not present

## 2021-03-08 DIAGNOSIS — I10 Essential (primary) hypertension: Secondary | ICD-10-CM | POA: Diagnosis not present

## 2021-03-08 HISTORY — PX: LEFT HEART CATH AND CORS/GRAFTS ANGIOGRAPHY: CATH118250

## 2021-03-08 LAB — POCT I-STAT 7, (LYTES, BLD GAS, ICA,H+H)
Acid-base deficit: 9 mmol/L — ABNORMAL HIGH (ref 0.0–2.0)
Bicarbonate: 18.5 mmol/L — ABNORMAL LOW (ref 20.0–28.0)
Calcium, Ion: 1.03 mmol/L — ABNORMAL LOW (ref 1.15–1.40)
HCT: 40 % (ref 36.0–46.0)
Hemoglobin: 13.6 g/dL (ref 12.0–15.0)
O2 Saturation: 97 %
Potassium: 3 mmol/L — ABNORMAL LOW (ref 3.5–5.1)
Sodium: 105 mmol/L — CL (ref 135–145)
TCO2: 20 mmol/L — ABNORMAL LOW (ref 22–32)
pCO2 arterial: 46.1 mmHg (ref 32.0–48.0)
pH, Arterial: 7.213 — ABNORMAL LOW (ref 7.350–7.450)
pO2, Arterial: 107 mmHg (ref 83.0–108.0)

## 2021-03-08 LAB — BASIC METABOLIC PANEL
Anion gap: 7 (ref 5–15)
BUN: 14 mg/dL (ref 8–23)
CO2: 25 mmol/L (ref 22–32)
Calcium: 8.6 mg/dL — ABNORMAL LOW (ref 8.9–10.3)
Chloride: 98 mmol/L (ref 98–111)
Creatinine, Ser: 0.71 mg/dL (ref 0.44–1.00)
GFR, Estimated: >60 mL/min (ref >60)
Glucose, Bld: 122 mg/dL — ABNORMAL HIGH (ref 70–99)
Potassium: 4 mmol/L (ref 3.5–5.1)
Sodium: 130 mmol/L — ABNORMAL LOW (ref 135–145)

## 2021-03-08 LAB — TROPONIN I (HIGH SENSITIVITY)
Troponin I (High Sensitivity): 39 ng/L — ABNORMAL HIGH (ref <18)
Troponin I (High Sensitivity): 330 ng/L (ref <18)

## 2021-03-08 LAB — RESP PANEL BY RT-PCR (FLU A&B, COVID) ARPGX2
Influenza A by PCR: NEGATIVE
Influenza B by PCR: NEGATIVE
SARS Coronavirus 2 by RT PCR: NEGATIVE

## 2021-03-08 LAB — POCT ACTIVATED CLOTTING TIME
Activated Clotting Time: 457 seconds
Activated Clotting Time: 323 seconds
Activated Clotting Time: 179 seconds
Activated Clotting Time: 179 seconds

## 2021-03-08 LAB — CBC
HCT: 38.6 % (ref 36.0–46.0)
Hemoglobin: 13 g/dL (ref 12.0–15.0)
MCH: 30.4 pg (ref 26.0–34.0)
MCHC: 33.7 g/dL (ref 30.0–36.0)
MCV: 90.2 fL (ref 80.0–100.0)
Platelets: 368 10*3/uL (ref 150–400)
RBC: 4.28 MIL/uL (ref 3.87–5.11)
RDW: 12.5 % (ref 11.5–15.5)
WBC: 6.6 10*3/uL (ref 4.0–10.5)
nRBC: 0 % (ref 0.0–0.2)

## 2021-03-08 SURGERY — LEFT HEART CATH AND CORS/GRAFTS ANGIOGRAPHY
Anesthesia: LOCAL

## 2021-03-08 MED ORDER — TICAGRELOR 90 MG PO TABS
ORAL_TABLET | ORAL | Status: DC | PRN
  Administered 2021-03-08: 180 mg via ORAL

## 2021-03-08 MED ORDER — DOXEPIN HCL 10 MG PO CAPS
10.0000 mg | ORAL_CAPSULE | Freq: Every evening | ORAL | Status: DC | PRN
  Filled 2021-03-08: qty 1

## 2021-03-08 MED ORDER — NITROGLYCERIN IN D5W 200-5 MCG/ML-% IV SOLN
INTRAVENOUS | Status: AC
  Filled 2021-03-08: qty 250

## 2021-03-08 MED ORDER — TICAGRELOR 90 MG PO TABS: 90.0000 mg | ORAL_TABLET | Freq: Two times a day (BID) | ORAL | Status: DC

## 2021-03-08 MED ORDER — NITROGLYCERIN 1 MG/10 ML FOR IR/CATH LAB
INTRA_ARTERIAL | Status: DC | PRN
  Administered 2021-03-08: 200 ug via INTRACORONARY

## 2021-03-08 MED ORDER — HEPARIN SODIUM (PORCINE) 5000 UNIT/ML IJ SOLN: 5000.0000 [IU] | Freq: Three times a day (TID) | INTRAMUSCULAR | Status: DC

## 2021-03-08 MED ORDER — MIDAZOLAM HCL 2 MG/2ML IJ SOLN
INTRAMUSCULAR | Status: AC
  Filled 2021-03-08: qty 2

## 2021-03-08 MED ORDER — FENTANYL CITRATE (PF) 100 MCG/2ML IJ SOLN
INTRAMUSCULAR | Status: AC
  Filled 2021-03-08: qty 2

## 2021-03-08 MED ORDER — SODIUM CHLORIDE 0.9% FLUSH: 3.0000 mL | Freq: Two times a day (BID) | INTRAVENOUS | Status: DC

## 2021-03-08 MED ORDER — MIDAZOLAM HCL 2 MG/2ML IJ SOLN
INTRAMUSCULAR | Status: DC | PRN
  Administered 2021-03-08 (×2): 1 mg via INTRAVENOUS

## 2021-03-08 MED ORDER — LIDOCAINE HCL (PF) 1 % IJ SOLN
INTRAMUSCULAR | Status: AC
  Filled 2021-03-08: qty 30

## 2021-03-08 MED ORDER — ACETAMINOPHEN 325 MG PO TABS
650.0000 mg | ORAL_TABLET | ORAL | Status: DC | PRN
  Administered 2021-03-08: 650 mg via ORAL
  Filled 2021-03-08: qty 2

## 2021-03-08 MED ORDER — TICAGRELOR 90 MG PO TABS
ORAL_TABLET | ORAL | Status: AC
  Filled 2021-03-08: qty 1

## 2021-03-08 MED ORDER — CARVEDILOL 3.125 MG PO TABS
3.1250 mg | ORAL_TABLET | Freq: Two times a day (BID) | ORAL | Status: DC
  Administered 2021-03-08 – 2021-03-09 (×2): 3.125 mg via ORAL
  Filled 2021-03-08 (×2): qty 1

## 2021-03-08 MED ORDER — SODIUM CHLORIDE 0.9% FLUSH: 3.0000 mL | INTRAVENOUS | Status: DC | PRN

## 2021-03-08 MED ORDER — FENTANYL CITRATE (PF) 100 MCG/2ML IJ SOLN
INTRAMUSCULAR | Status: DC | PRN
  Administered 2021-03-08 (×3): 25 ug via INTRAVENOUS

## 2021-03-08 MED ORDER — LABETALOL HCL 5 MG/ML IV SOLN: 10.0000 mg | INTRAVENOUS | Status: AC | PRN

## 2021-03-08 MED ORDER — LEVOTHYROXINE SODIUM 50 MCG PO TABS
50.0000 ug | ORAL_TABLET | Freq: Every day | ORAL | Status: DC
  Administered 2021-03-09: 50 ug via ORAL
  Filled 2021-03-08: qty 1

## 2021-03-08 MED ORDER — HEPARIN SODIUM (PORCINE) 5000 UNIT/ML IJ SOLN
5000.0000 [IU] | Freq: Three times a day (TID) | INTRAMUSCULAR | Status: DC
  Administered 2021-03-09: 5000 [IU] via SUBCUTANEOUS
  Filled 2021-03-08: qty 1

## 2021-03-08 MED ORDER — HEPARIN (PORCINE) IN NACL 1000-0.9 UT/500ML-% IV SOLN
INTRAVENOUS | Status: DC | PRN
  Administered 2021-03-08 (×2): 500 mL

## 2021-03-08 MED ORDER — ONDANSETRON HCL 4 MG/2ML IJ SOLN: 4.0000 mg | Freq: Four times a day (QID) | INTRAMUSCULAR | Status: DC | PRN

## 2021-03-08 MED ORDER — LIDOCAINE HCL (PF) 1 % IJ SOLN
INTRAMUSCULAR | Status: DC | PRN
  Administered 2021-03-08: 11 mL via INTRADERMAL

## 2021-03-08 MED ORDER — MEMANTINE HCL 10 MG PO TABS
10.0000 mg | ORAL_TABLET | Freq: Two times a day (BID) | ORAL | Status: DC
  Administered 2021-03-08 – 2021-03-09 (×2): 10 mg via ORAL
  Filled 2021-03-08 (×4): qty 1

## 2021-03-08 MED ORDER — SODIUM CHLORIDE 0.9 % WEIGHT BASED INFUSION
1.0000 mL/kg/h | INTRAVENOUS | Status: AC
  Administered 2021-03-08: 1 mL/kg/h via INTRAVENOUS

## 2021-03-08 MED ORDER — HYDRALAZINE HCL 20 MG/ML IJ SOLN: 10.0000 mg | INTRAMUSCULAR | Status: AC | PRN

## 2021-03-08 MED ORDER — NITROGLYCERIN 1 MG/10 ML FOR IR/CATH LAB
INTRA_ARTERIAL | Status: AC
  Filled 2021-03-08: qty 10

## 2021-03-08 MED ORDER — DIPHENHYDRAMINE HCL 25 MG PO CAPS
25.0000 mg | ORAL_CAPSULE | Freq: Every evening | ORAL | Status: DC | PRN
  Filled 2021-03-08: qty 1

## 2021-03-08 MED ORDER — HEPARIN SODIUM (PORCINE) 1000 UNIT/ML IJ SOLN
INTRAMUSCULAR | Status: DC | PRN
  Administered 2021-03-08: 7000 [IU] via INTRAVENOUS

## 2021-03-08 MED ORDER — SODIUM CHLORIDE 0.9% FLUSH
3.0000 mL | Freq: Two times a day (BID) | INTRAVENOUS | Status: DC
  Administered 2021-03-09: 3 mL via INTRAVENOUS

## 2021-03-08 MED ORDER — CARBAMAZEPINE ER 200 MG PO TB12
600.0000 mg | ORAL_TABLET | Freq: Two times a day (BID) | ORAL | Status: DC
  Administered 2021-03-08 – 2021-03-09 (×2): 600 mg via ORAL
  Filled 2021-03-08 (×4): qty 3

## 2021-03-08 MED ORDER — NITROGLYCERIN 0.4 MG SL SUBL: 0.4000 mg | SUBLINGUAL_TABLET | SUBLINGUAL | Status: DC | PRN

## 2021-03-08 MED ORDER — SODIUM CHLORIDE 0.9 % IV SOLN: 250.0000 mL | INTRAVENOUS | Status: DC | PRN

## 2021-03-08 MED ORDER — DULOXETINE HCL 60 MG PO CPEP
60.0000 mg | ORAL_CAPSULE | Freq: Every day | ORAL | Status: DC
  Administered 2021-03-09: 60 mg via ORAL
  Filled 2021-03-08: qty 1

## 2021-03-08 MED ORDER — ASPIRIN 81 MG PO CHEW
81.0000 mg | CHEWABLE_TABLET | Freq: Every day | ORAL | Status: DC
  Administered 2021-03-09: 81 mg via ORAL
  Filled 2021-03-08: qty 1

## 2021-03-08 MED ORDER — HEPARIN (PORCINE) IN NACL 1000-0.9 UT/500ML-% IV SOLN
INTRAVENOUS | Status: AC
  Filled 2021-03-08: qty 1000

## 2021-03-08 SURGICAL SUPPLY — 28 items
BALLN SAPPHIRE 1.25X10 (BALLOONS) ×2
BALLN SAPPHIRE 2.0X12 (BALLOONS) ×2
BALLN SAPPHIRE 2.5X12 (BALLOONS) ×2
BALLN SAPPHIRE 3.0X12 (BALLOONS) ×2
BALLN SAPPHIRE ~~LOC~~ 3.0X12 (BALLOONS) ×1 IMPLANT
BALLN SAPPHIRE ~~LOC~~ 3.25X12 (BALLOONS) ×1 IMPLANT
BALLOON SAPPHIRE 1.25X10 (BALLOONS) IMPLANT
BALLOON SAPPHIRE 2.0X12 (BALLOONS) IMPLANT
BALLOON SAPPHIRE 2.5X12 (BALLOONS) IMPLANT
BALLOON SAPPHIRE 3.0X12 (BALLOONS) IMPLANT
CATH INFINITI 5FR MULTPACK ANG (CATHETERS) ×2 IMPLANT
CATH LAUNCHER 6FR EBU3.5 (CATHETERS) ×1 IMPLANT
GUIDELINER 6F (CATHETERS) ×1 IMPLANT
KIT ENCORE 26 ADVANTAGE (KITS) ×1 IMPLANT
KIT ESSENTIALS PG (KITS) ×1 IMPLANT
KIT HEART LEFT (KITS) ×2 IMPLANT
PACK CARDIAC CATHETERIZATION (CUSTOM PROCEDURE TRAY) ×2 IMPLANT
SHEATH PINNACLE 5F 10CM (SHEATH) ×1 IMPLANT
SHEATH PINNACLE 6F 10CM (SHEATH) ×1 IMPLANT
STENT ONYX FRONTIER 3.0X15 (Permanent Stent) ×1 IMPLANT
STENT SYNERGY XD 3.0X16 (Permanent Stent) IMPLANT
STENT SYNERGY XD 3.0X20 (Permanent Stent) IMPLANT
SYNERGY XD 3.0X16 (Permanent Stent) ×2 IMPLANT
SYNERGY XD 3.0X20 (Permanent Stent) ×2 IMPLANT
TRANSDUCER W/STOPCOCK (MISCELLANEOUS) ×2 IMPLANT
TUBING CIL FLEX 10 FLL-RA (TUBING) ×2 IMPLANT
WIRE ASAHI PROWATER 180CM (WIRE) ×1 IMPLANT
WIRE EMERALD 3MM-J .035X150CM (WIRE) ×1 IMPLANT

## 2021-03-08 NOTE — ED Notes (Signed)
ED Provider at bedside. 

## 2021-03-08 NOTE — ED Provider Notes (Signed)
[facility] EMERGENCY DEPARTMENT Provider Note   CSN: [id number] Arrival date & time: 03/08/21  1155     History Chief Complaint  Patient presents with   Chest Pain    Rachel Clark is a 70 y.o. female.  HPI Patient is a 70 year old female with past medical history significant for reflux, HLD, CAD with remote NSTEMI with PCI then two-vessel CABG, history of unstable angina, IDA, memory issues, chronic low back pain  Patient is presented to the emergency room today with symptoms of chest pain which is now resolved.  She states it was sternal nonradiating nonexertional nonpleuritic started somewhere around 10 AM this morning she thinks it lasted for maybe half an hour.  She states that she took 1 baby aspirin and then called 911 and was told to take an additional 3 tablets of aspirin.  She states that she did this directed.  She states that her symptoms then resolved.  She denies any diaphoresis shortness of breath cough fevers lightheadedness or dizziness during this episode.  She states she has no symptoms currently.  Denies any leg swelling, hemoptysis, calf pain  No other associate symptoms.  States that she feels well now.      Past Medical History:  Diagnosis Date   Arthritis    "fingers" (06/29/2016)   Chronic lower back pain    Colon cancer ([place]) 12/04/2011   s/p Laparoscopic-assisted transverse colectomy on 12/19/2011 by Dr. Donne Hazel.  pT3 N0 M0.    Coronary artery disease    a. remote NSTEMI with PCI to the RCA; b. S/P CABG in 2007; c. 2010 - 2 v CAD with L-LAD and S-RCA patent, small caliber Dx 70% (treated medically - no amenable to PCI), CFX free of significant disease, normal LVF. d. 10/2014 Lexi MV: EF 61%, no ischemia/infarct.   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   last seizure was in 1972 ;takes Tegretol (06/29/2016)     Headache    "weekly" (06/29/2016)   Hyperlipidemia    Hypothyroid    Iron deficiency anemia 11/17/2011   Memory change 11/13/2016   Monoclonal gammopathy of unknown significance    Myocardial infarction [facility]) 2007   Orthostatic hypotension 12/21/2020   Presence of permanent cardiac pacemaker    Syncope and collapse    pacemaker implanted   Unstable angina [facility])     Patient Active Problem List   Diagnosis Date Noted   Preventive measure 01/14/2021   Orthostatic hypotension 12/21/2020   Memory change 11/13/2016   Acute bronchitis 03/23/2016   Chest pain with high risk for cardiac etiology 11/05/2015   Essential hypertension 08/25/2015   Sick sinus syndrome ([place]) 08/16/2015   Syncope and collapse 08/10/2015   CAD, multiple vessel    Chest pain    Cardiac pacemaker in situ 07/25/2015   Angina pectoris ([place]) 03/24/2015   Unstable angina ([place]) 10/26/2014   Seizures ([place]) 02/19/2014   Lung nodule 02/06/2013   Thyroid nodule 02/06/2013   History of colon cancer 12/04/2011   GERD (gastroesophageal reflux disease) 08/17/2011   Monoclonal gammopathy of unknown significance    Hypothyroid    Coronary artery disease, post CABG 2007     Dyslipidemia     Past Surgical History:  Procedure Laterality Date   ANTERIOR CERVICAL DECOMP/DISCECTOMY FUSION  1980's   BACK SURGERY     CARDIAC  CATHETERIZATION  05/20/2009   obstructive native vessel disease in LAD, RCA, and first diagonal, patent vein graft to distal RCA and LIMA to LAD,normal. ef 60%   CARDIAC CATHETERIZATION N/A 03/24/2015   Procedure: Left Heart Cath and Cors/Grafts Angiography;  Surgeon: Wellington Hampshire, MD;  Location: [place] CV LAB;  Service: Cardiovascular;  Laterality: N/A;   CARDIAC CATHETERIZATION N/A 07/28/2015   Procedure: Left Heart Cath and Coronary Angiography;  Surgeon: Troy Sine, MD;  Location: [place] CV LAB;  Service: Cardiovascular;  Laterality: N/A;   CARDIAC CATHETERIZATION N/A 11/09/2015    Procedure: Coronary Stent Intervention;  Surgeon: Sherren Mocha, MD;  Location: [place] CV LAB;  Service: Cardiovascular;  Laterality: N/A;   CARDIAC CATHETERIZATION N/A 11/09/2015   Procedure: Left Heart Cath and Coronary Angiography;  Surgeon: Sherren Mocha, MD;  Location: [place] CV LAB;  Service: Cardiovascular;  Laterality: N/A;   CARDIAC CATHETERIZATION N/A 06/29/2016   Procedure: Left Heart Cath and Coronary Angiography;  Surgeon: Nelva Bush, MD;  Location: [place] CV LAB;  Service: Cardiovascular;  Laterality: N/A;   CARDIAC CATHETERIZATION N/A 06/29/2016   Procedure: Intravascular Pressure Wire/FFR Study;  Surgeon: Nelva Bush, MD;  Location: [place] CV LAB;  Service: Cardiovascular;  Laterality: N/A;   CARDIAC CATHETERIZATION N/A 06/29/2016   Procedure: Coronary Balloon Angioplasty;  Surgeon: Nelva Bush, MD;  Location: [place] CV LAB;  Service: Cardiovascular;  Laterality: N/A;   COLON RESECTION  12/19/2011   Procedure: COLON RESECTION LAPAROSCOPIC;  Surgeon: Rolm Bookbinder, MD;  Location: [place];  Service: General;  Laterality: N/A;  laparoscopic hand assisted partial colon resection   COLON SURGERY     COLONOSCOPY     CORONARY ANGIOPLASTY WITH STENT PLACEMENT  ~ 2007   1 stent   CORONARY ARTERY BYPASS GRAFT  2007   "CABG X2"   DILATION AND CURETTAGE OF UTERUS  1973   EP IMPLANTABLE DEVICE N/A 03/25/2015   MDT Advisa DR pacemaker implanted by Dr Rayann Heman for transient complete heart block and syncope   INSERT / REPLACE / REMOVE PACEMAKER     LEFT HEART CATH AND CORS/GRAFTS ANGIOGRAPHY N/A 02/21/2018   Procedure: LEFT HEART CATH AND CORS/GRAFTS ANGIOGRAPHY;  Surgeon: Lorretta Harp, MD;  Location: [place] CV LAB;  Service: Cardiovascular;  Laterality: N/A;   PORT-A-CATH REMOVAL N/A 06/12/2013   Procedure: REMOVAL PORT-A-CATH;  Surgeon: Rolm Bookbinder, MD;  Location: [employer];  Service: General;  Laterality: N/A;   PORTACATH  PLACEMENT  06/04/2012   Procedure: INSERTION PORT-A-CATH;  Surgeon: Rolm Bookbinder, MD;  Location: [place];  Service: General;  Laterality: N/A;  Insertion of port-a-cath    POSTERIOR LAMINECTOMY / [place]       OB History   No obstetric history on file.     Family History  Problem Relation Age of Onset   Heart attack Father    Heart disease Father    Heart attack Brother 43   Cancer Mother        lung   Stroke Neg Hx     Social History   Tobacco Use   Smoking status: Former    Packs/day: 0.30    Years: 20.00    Pack years: 6.00    Types: Cigarettes    Quit date: 06/06/1981    Years since quitting: 39.7   Smokeless tobacco: Never  Vaping Use   Vaping Use: Never used  Substance Use Topics  Alcohol use: Yes    Alcohol/week: 1.0 standard drink    Types: 1 Glasses of wine per week    Comment: occ   Drug use: No    Home Medications Prior to Admission medications   Medication Sig Start Date End Date Taking? Authorizing Provider  acetaminophen (TYLENOL) 500 MG tablet Take 500-1,000 mg by mouth daily as needed for mild pain or headache.   Yes [provider]  aspirin 81 MG chewable tablet Chew 1 tablet (81 mg total) by mouth daily. 06/30/16  Yes Arbutus Leas, NP  Azelastine HCl 0.15 % SOLN Place 1 spray into both nostrils daily as needed (seasonal allergies).  12/22/14  Yes [provider]  Bempedoic Acid-Ezetimibe (NEXLIZET) 180-10 MG TABS Take 180 mg by mouth daily. 12/29/20  Yes [name], Peter M, MD  carbamazepine (TEGRETOL XR) 200 MG 12 hr tablet Take 3 tablets (600 mg total) by mouth 2 (two) times daily. 01/19/21  Yes Kathrynn Ducking, MD  carvedilol (COREG) 3.125 MG tablet Take 1 tablet (3.125 mg total) by mouth 2 (two) times daily. 09/10/20 09/05/21 Yes [name], Peter M, MD  DULoxetine (CYMBALTA) 60 MG capsule Take 60 mg by mouth daily.    Yes [provider]  ethosuximide (ZARONTIN) 250 MG  capsule TAKE 2 CAPSULES IN THE MORNING, TAKE 2 CAPSULES AT NIGHT 01/19/21  Yes Suzzanne Cloud, NP  levothyroxine (SYNTHROID, LEVOTHROID) 50 MCG tablet Take 50 mcg by mouth daily.   Yes [provider]  memantine (NAMENDA) 10 MG tablet Take 1 tablet (10 mg total) by mouth 2 (two) times daily. 08/17/20  Yes Suzzanne Cloud, NP  nitroGLYCERIN (NITROSTAT) 0.4 MG SL tablet Place 1 tablet (0.4 mg total) under the tongue every 5 (five) minutes as needed. For chest pain. 09/10/20  Yes [name], Peter M, MD  tiZANidine (ZANAFLEX) 2 MG tablet Take by mouth every 6 (six) hours as needed for muscle spasms.   Yes [provider]    Allergies    Aspirin, Capecitabine, Crestor [rosuvastatin], Erythromycin, Fluticasone, Lipitor [atorvastatin], Lisinopril, Niacin and related, Statins, Sulfa drugs cross reactors, Sulfamethoxazole, Tetracyclines & related, Penicillins, and Plavix [clopidogrel bisulfate]  Review of Systems   Review of Systems  Constitutional:  Negative for chills and fever.  HENT:  Negative for congestion.   Eyes:  Negative for pain.  Respiratory:  Negative for cough and shortness of breath.   Cardiovascular:  Positive for chest pain (Now resolved). Negative for leg swelling.  Gastrointestinal:  Negative for abdominal pain and vomiting.  Genitourinary:  Negative for dysuria.  Musculoskeletal:  Negative for myalgias.  Skin:  Negative for rash.  Neurological:  Negative for dizziness and headaches.   Physical Exam Updated Vital Signs BP (!) 111/95   Pulse 85   Temp 98.3 F (36.8 C) (Oral)   Resp 14   Ht '5\' 9"'$  (1.753 m)   Wt 77.1 kg   SpO2 97%   BMI 25.10 kg/m   Physical Exam Vitals and nursing note reviewed.  Constitutional:      General: She is not in acute distress.    Comments: Pleasant 70 year old female in no acute distress sitting comfortably in bed.  Able answer questions appropriate follow commands.  HENT:     Head: Normocephalic and atraumatic.     Nose: Nose  normal.  Eyes:     General: No scleral icterus. Cardiovascular:     Rate and Rhythm: Normal rate and regular rhythm.     Pulses: Normal pulses.  Heart sounds: Normal heart sounds.  Pulmonary:     Effort: Pulmonary effort is normal. No respiratory distress.     Breath sounds: No wheezing.  Abdominal:     Palpations: Abdomen is soft.     Tenderness: There is no abdominal tenderness. There is no guarding or rebound.     Comments: No abdominal tenderness  Musculoskeletal:     Cervical back: Normal range of motion.     Right lower leg: No edema.     Left lower leg: No edema.  Skin:    General: Skin is warm and dry.     Capillary Refill: Capillary refill takes less than 2 seconds.  Neurological:     Mental Status: She is alert. Mental status is at baseline.  Psychiatric:        Mood and Affect: Mood normal.        Behavior: Behavior normal.    ED Results / Procedures / Treatments   Labs (all labs ordered are listed, but only abnormal results are displayed) Labs Reviewed  BASIC METABOLIC PANEL - Abnormal; Notable for the following components:      Result Value   Sodium 130 (*)    Glucose, Bld 122 (*)    Calcium 8.6 (*)    All other components within normal limits  TROPONIN I (HIGH SENSITIVITY) - Abnormal; Notable for the following components:   Troponin I (High Sensitivity) 39 (*)    All other components within normal limits  CBC    EKG EKG Interpretation  Date/Time:  Tuesday March 08 2021 11:59:56 EDT Ventricular Rate:  84 PR Interval:  208 QRS Duration: 91 QT Interval:  365 QTC Calculation: 432 R Axis:   47 Text Interpretation: Sinus rhythm Nonspecific repol abnormality, diffuse leads Baseline wander in lead(s) V5 Trace elevation aVR and diffuse ST depression. Confirmed by Daleen Bo [phone]) on 03/08/2021 12:46:52 PM  Radiology No results found.  Procedures Procedures   Medications Ordered in ED Medications - No data to display  ED Course  I have  reviewed the triage vital signs and the nursing notes.  Pertinent labs & imaging results that were available during my care of the patient were reviewed by me and considered in my medical decision making (see chart for details).  Clinical Course as of 03/08/21 1540  Tue Mar 08, 2021  1308 Discussed with Dr. Marlou Porch of cardiology who will evaulate pt at bedside.  No CP at this time. Repeat EKG ordered. Covid swab ordered. [WF]    Clinical Course User Index [WF] Tedd Sias, [place]   MDM Rules/Calculators/A&P                           Patient 70 year old female with past medical history detailed in HPI presented today for episode of chest pain that resolved.  EKG with changes with diffuse ST depression and some mild elevation in aVR discussed with Dr. Marlou Porch of cardiology who will evaluate.  Troponin elevated from baseline of 4 today to 39.  BMP relatively unremarkable.  CBC unremarkable.  Second run and pending.  COVID influenza negative.  Chest x-ray unremarkable.  Patient noted to cardiology service.  Final Clinical Impression(s) / ED Diagnoses Final diagnoses:  Chest pain, unspecified type    Rx / DC Orders ED Discharge Orders     None        Tedd Sias, [place] 03/08/21 1542    Daleen Bo, MD 03/08/21 [place] Emer[place]

## 2021-03-08 NOTE — ED Notes (Signed)
Pt up to bathroom w/no assist

## 2021-03-08 NOTE — ED Notes (Signed)
Patient transported to X-ray 

## 2021-03-08 NOTE — H&P (Signed)
Cardiology Admission History and Physical:   Patient ID: Rachel Clark MRN: [id number]; DOB: 1950-09-29   Admission date: 03/08/2021  PCP:  Harlan Stains, MD   [facility] Providers Cardiologist:  Peter [name], MD        Chief Complaint: Chest pain  Patient Profile:   Rachel Clark is a 70 y.o. female with known CAD prior CABG, prior RCA stenting, occluded SVG graft to RCA who is being seen 03/08/2021 for the evaluation of chest discomfort elevated troponin abnormal EKG.  History of Present Illness:   [name] is a 70 year old female with known CAD post CABG in 2007 with subsequent failure of SVG to RCA with extensive stenting of the RCA with last intervention in 2017 with balloon angioplasty of ostial RCA for restenosis, last catheterization in August 2019 showing patent RCA with comorbidities of pacemaker placement in 2016 secondary to complete heart block/syncope, essential hypertension, GERD, hypothyroidism, hyperlipidemia with statin myopathy here with chest discomfort.  Earlier today at around 69 or 10 AM felt substernal chest pressure that was quite intense.  It lasted several minutes duration.  She took a full aspirin at the direction of EMS.  When EMS arrived, blood pressure was normal.  EKG strip showed mild diffuse ST segment depression with aVR elevation.  EKG in the emergency department was compatible with this.  Initial troponin was elevated at 39, mild.  She has battled with orthostatic hypotension/syncopal type episodes dizziness for quite some time.  Pacemaker function is normal by Dr. Rayann Heman.  Her isosorbide was previously discontinued.  Hydration may have improved some of the symptoms.  Echocardiogram has been unremarkable.   Past Medical History:  Diagnosis Date   Arthritis    "fingers" (06/29/2016)   Chronic lower back pain    Colon cancer ([place]) 12/04/2011   s/p Laparoscopic-assisted transverse colectomy on 12/19/2011 by Dr. Donne Hazel.  pT3 N0 M0.     Coronary artery disease    a. remote NSTEMI with PCI to the RCA; b. S/P CABG in 2007; c. 2010 - 2 v CAD with L-LAD and S-RCA patent, small caliber Dx 70% (treated medically - no amenable to PCI), CFX free of significant disease, normal LVF. d. 10/2014 Lexi MV: EF 61%, no ischemia/infarct.   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   last seizure was in 1972 ;takes Tegretol (06/29/2016)    Headache    "weekly" (06/29/2016)   Hyperlipidemia    Hypothyroid    Iron deficiency anemia 11/17/2011   Memory change 11/13/2016   Monoclonal gammopathy of unknown significance    Myocardial infarction [facility]) 2007   Orthostatic hypotension 12/21/2020   Presence of permanent cardiac pacemaker    Syncope and collapse    pacemaker implanted   Unstable angina [facility])     Past Surgical History:  Procedure Laterality Date   ANTERIOR CERVICAL DECOMP/DISCECTOMY FUSION  1980's   BACK SURGERY     CARDIAC CATHETERIZATION  05/20/2009   obstructive native vessel disease in LAD, RCA, and first diagonal, patent vein graft to distal RCA and LIMA to LAD,normal. ef 60%   CARDIAC CATHETERIZATION N/A 03/24/2015   Procedure: Left Heart Cath and Cors/Grafts Angiography;  Surgeon: Wellington Hampshire, MD;  Location: [place] CV LAB;  Service: Cardiovascular;  Laterality: N/A;   CARDIAC CATHETERIZATION N/A 07/28/2015   Procedure: Left Heart Cath and Coronary Angiography;  Surgeon: Troy Sine, MD;  Location: [place] [facility];  Service: Cardiovascular;  Laterality: N/A;   CARDIAC CATHETERIZATION N/A 11/09/2015   Procedure: Coronary Stent Intervention;  Surgeon: Sherren Mocha, MD;  Location: [place] [facility];  Service: Cardiovascular;  Laterality: N/A;   CARDIAC CATHETERIZATION N/A 11/09/2015   Procedure: Left Heart Cath and Coronary Angiography;  Surgeon: Sherren Mocha, MD;  Location: [place] [facility];   Service: Cardiovascular;  Laterality: N/A;   CARDIAC CATHETERIZATION N/A 06/29/2016   Procedure: Left Heart Cath and Coronary Angiography;  Surgeon: Nelva Bush, MD;  Location: [place] CV [facility];  Service: Cardiovascular;  Laterality: N/A;   CARDIAC CATHETERIZATION N/A 06/29/2016   Procedure: Intravascular Pressure Wire/FFR Study;  Surgeon: Nelva Bush, MD;  Location: [place] [facility];  Service: Cardiovascular;  Laterality: N/A;   CARDIAC CATHETERIZATION N/A 06/29/2016   Procedure: Coronary Balloon Angioplasty;  Surgeon: Nelva Bush, MD;  Location: [place] [facility];  Service: Cardiovascular;  Laterality: N/A;   COLON RESECTION  12/19/2011   Procedure: COLON RESECTION LAPAROSCOPIC;  Surgeon: Rolm Bookbinder, MD;  Location: [place];  Service: General;  Laterality: N/A;  laparoscopic hand assisted partial colon resection   COLON SURGERY     COLONOSCOPY     CORONARY ANGIOPLASTY WITH STENT PLACEMENT  ~ 2007   1 stent   CORONARY ARTERY BYPASS GRAFT  2007   "CABG X2"   DILATION AND CURETTAGE OF UTERUS  1973   EP IMPLANTABLE DEVICE N/A 03/25/2015   MDT Advisa DR pacemaker implanted by Dr Rayann Heman for transient complete heart block and syncope   INSERT / REPLACE / REMOVE PACEMAKER     LEFT HEART CATH AND CORS/GRAFTS ANGIOGRAPHY N/A 02/21/2018   Procedure: LEFT HEART CATH AND CORS/GRAFTS ANGIOGRAPHY;  Surgeon: Lorretta Harp, MD;  Location: [place] [facility];  Service: Cardiovascular;  Laterality: N/A;   PORT-A-CATH REMOVAL N/A 06/12/2013   Procedure: REMOVAL PORT-A-CATH;  Surgeon: Rolm Bookbinder, MD;  Location: [place];  Service: General;  Laterality: N/A;   PORTACATH PLACEMENT  06/04/2012   Procedure: INSERTION PORT-A-CATH;  Surgeon: Rolm Bookbinder, MD;  Location: [place];  Service: General;  Laterality: N/A;  Insertion of port-a-cath    POSTERIOR LAMINECTOMY / [place]       Medications Prior to  Admission: Prior to Admission medications   Medication Sig Start Date End Date Taking? Authorizing Provider  acetaminophen (TYLENOL) 500 MG tablet Take 500-1,000 mg by mouth daily as needed for mild pain or headache.   Yes [provider]  aspirin 81 MG chewable tablet Chew 1 tablet (81 mg total) by mouth daily. 06/30/16  Yes Arbutus Leas, NP  Azelastine HCl 0.15 % SOLN Place 1 spray into both nostrils daily as needed (seasonal allergies).  12/22/14  Yes [provider]  Bempedoic Acid-Ezetimibe (NEXLIZET) 180-10 MG TABS Take 180 mg by mouth daily. 12/29/20  Yes [name], Peter M, MD  carbamazepine (TEGRETOL XR) 200 MG 12 hr tablet Take 3 tablets (600 mg total) by mouth 2 (two) times daily. 01/19/21  Yes Kathrynn Ducking, MD  carvedilol (COREG) 3.125 MG tablet Take 1 tablet (3.125 mg total) by mouth 2 (two) times daily. 09/10/20 09/05/21 Yes [name], Peter M, MD  DULoxetine (CYMBALTA) 60 MG capsule Take 60 mg by mouth daily.    Yes [provider]  ethosuximide (ZARONTIN) 250 MG capsule TAKE 2 CAPSULES IN THE MORNING, TAKE 2 CAPSULES AT NIGHT  01/19/21  Yes Suzzanne Cloud, NP  levothyroxine (SYNTHROID, LEVOTHROID) 50 MCG tablet Take 50 mcg by mouth daily.   Yes [provider]  memantine (NAMENDA) 10 MG tablet Take 1 tablet (10 mg total) by mouth 2 (two) times daily. 08/17/20  Yes Suzzanne Cloud, NP  nitroGLYCERIN (NITROSTAT) 0.4 MG SL tablet Place 1 tablet (0.4 mg total) under the tongue every 5 (five) minutes as needed. For chest pain. 09/10/20  Yes [name], Peter M, MD  tiZANidine (ZANAFLEX) 2 MG tablet Take by mouth every 6 (six) hours as needed for muscle spasms.   Yes [provider]     Allergies:    Allergies  Allergen Reactions   Aspirin Other (See Comments)    GI upset- can tolerate 81 mg ASA, just not full doses   Capecitabine     Other reaction(s): diarrhea   Crestor [Rosuvastatin]     myalgias   Erythromycin Nausea Only    Gi upset    Fluticasone     Other reaction(s): sore throat   Lipitor [Atorvastatin]     myalgias   Lisinopril Cough   Niacin And Related Other (See Comments)    Pt does not recall reactions    Statins Other (See Comments)    Muscle pain   Sulfa Drugs Cross Reactors Swelling   Sulfamethoxazole     Other reaction(s): anaphylaxis   Tetracyclines & Related Nausea Only   Penicillins Nausea Only and Rash    Has patient had a PCN reaction causing immediate rash, facial/tongue/throat swelling, SOB or lightheadedness with hypotension: Yes Has patient had a PCN reaction causing severe rash involving mucus membranes or skin necrosis: No Has patient had a PCN reaction that required hospitalization No Has patient had a PCN reaction occurring within the last 10 years: No If all of the above answers are "NO", then may proceed with Cephalosporin use.    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   Patient is right handed.   Patient has a high school education with some college education.   Patient drinks 2 cups daily.   Social  Determinants of Health   Financial Resource Strain: Not on file  Food Insecurity: Not on file  Transportation Needs: Not on file  Physical Activity: Not on file  Stress: Not on file  Social Connections: Not on file  Intimate Partner Violence: Not on  file    Family History:   The patient's family history includes Cancer in her mother; Heart attack in her father; Heart attack (age of onset: 41) in her brother; Heart disease in her father. There is no history of Stroke.    ROS:  Please see the history of present illness.  Denies any bleeding fevers chills nausea vomiting all other ROS reviewed and negative.     Physical Exam/Data:   Vitals:   03/08/21 1207 03/08/21 1213 03/08/21 1230 03/08/21 1330  BP: 123/75  (!) 111/95 133/71  Pulse:   85 73  Resp:   14 (!) 23  Temp:      TempSrc:      SpO2:  97% 97% 98%  Weight:      Height:       No intake or output data in the 24 hours ending 03/08/21 1359 Last 3 Weights 03/08/2021 01/14/2021 12/28/2020  Weight (lbs) 170 lb 173 lb 9.6 oz 175 lb 3.2 oz  Weight (kg) 77.111 kg 78.744 kg 79.47 kg     Body mass index is 25.1 kg/m.  General:  Well nourished, well developed, in no acute distress HEENT: normal Lymph: no adenopathy Neck: no JVD Endocrine:  No thryomegaly Vascular: No carotid bruits; FA pulses 2+ bilaterally without bruits  Cardiac:  normal S1, S2; RRR; no murmur  Lungs:  clear to auscultation bilaterally, no wheezing, rhonchi or rales  Abd: soft, nontender, no hepatomegaly  Ext: no edema Musculoskeletal:  No deformities, BUE and BLE strength normal and equal Skin: warm and dry  Neuro:  CNs 2-12 intact, no focal abnormalities noted Psych:  Normal affect she did turn her husband for clarification of answers to questions.   EKG:  The ECG that was done today was personally reviewed and demonstrates sinus rhythm diffuse approximately 1 mm ST segment depression and 1 mm ST segment elevation in aVR compatible with generalized  ischemia.  EMS strip similar.  This is a change from her prior EKG where ST segments were relatively flat.  Relevant CV Studies: Cardiac catheterization 03-09-18: Previously placed Ost RCA to Mid RCA stent (unknown type) is widely patent. Prox LAD lesion is 100% stenosed. Origin lesion is 100% stenosed. The left ventricular systolic function is normal. LV end diastolic pressure is normal. The left ventricular ejection fraction is 55-65% by visual estimate. Diagnostic Dominance: Right   Laboratory Data:  High Sensitivity Troponin:   Recent Labs  Lab 03/08/21 1155  TROPONINIHS 39*      Chemistry Recent Labs  Lab 03/08/21 1155  NA 130*  K 4.0  CL 98  CO2 25  GLUCOSE 122*  BUN 14  CREATININE 0.71  CALCIUM 8.6*  GFRNONAA >60  ANIONGAP 7    No results for input(s): PROT, ALBUMIN, AST, ALT, ALKPHOS, BILITOT in the last 168 hours. Hematology Recent Labs  Lab 03/08/21 1155  WBC 6.6  RBC 4.28  HGB 13.0  HCT 38.6  MCV 90.2  MCH 30.4  MCHC 33.7  RDW 12.5  PLT 368   BNPNo results for input(s): BNP, PROBNP in the last 168 hours.  DDimer No results for input(s): DDIMER in the last 168 hours.   Radiology/Studies:  DG Chest 2 View  Result Date: 03/08/2021 CLINICAL DATA:  CP EXAM: CHEST - 2 VIEW COMPARISON:  Nov 10, 2020. FINDINGS: Similar cardiomediastinal silhouette. Left subclavian approach cardiac rhythm maintenance device in similar position. CABG and median sternotomy. Coronary stenting. Both lungs are clear. No visible pleural effusions or pneumothorax.  No acute osseous abnormality. IMPRESSION: No active cardiopulmonary disease. Electronically Signed   By: Margaretha Sheffield M.D.   On: 03/08/2021 13:30     Assessment and Plan:   70 year old with coronary artery disease status post CABG, SVG graft failure, patent LIMA to LAD on catheterization in 2019 as well as patent native proximal RCA stenting here with chest pressure, abnormal EKG with ST segment depression  concerning for diffuse ischemia, elevated troponin 39 compatible with small non-ST elevation myocardial infarction.  Non-ST elevation myocardial infarction - We will go ahead and proceed with cardiac catheterization.  Certainly the troponin elevation as well as ST segment depression could be secondary to generalized demand ischemia potentially in the setting of transient orthostatic hypotension which she has had for quite some time.  Her blood pressure when EMS arrived was normal.  Blood pressure in the ER has been normal as well.  However, she does have underlying coronary disease and deserves further investigation. - Discussed risks and benefits with her husband and she is willing to proceed.  Risk and benefits include stroke heart attack death renal impairment bleeding.  Orthostatic hypotension - Near syncope orthostatic hypotension has been a longstanding issue with her.  Is been challenging to titrate any antianginal medications.  Perhaps Ranexa would be a viable choice for her in the future.  Permanent pacemaker - 2016 secondary to complete heart block.  Still had syncopal-like symptoms following pacemaker implantation.  Orthostasis like. -She is followed by Dr. Rayann Heman.  Normal functioning pacemaker.  Isosorbide had been discontinued in the past.  Hydration.  Statin intolerance - Continue with Crestor 5 mg a day up to 3 times weekly as well as Zetia 10 mg daily.  Memory impairment - She is taking Namenda as an outpatient.  She is also on carbamazepine as well.  Note, she did turned her husband for support with answering questions during my encounter with her.  Discussed her case with her primary cardiologist, Dr. Peter [name].  Risk Assessment/Risk Scores:    TIMI Risk Score for Unstable Angina or Non-ST Elevation MI:   The patient's TIMI risk score is 6, which indicates a 41% risk of all cause mortality, new or recurrent myocardial infarction or need for urgent revascularization in the  next 14 days.     Severity of Illness: The appropriate patient status for this patient is INPATIENT. Inpatient status is judged to be reasonable and necessary in order to provide the required intensity of service to ensure the patient's safety. The patient's presenting symptoms, physical exam findings, and initial radiographic and laboratory data in the context of their chronic comorbidities is felt to place them at high risk for further clinical deterioration. Furthermore, it is not anticipated that the patient will be medically stable for discharge from the hospital within 2 midnights of admission. The following factors support the patient status of inpatient.   " The patient's presenting symptoms include chest pain. " The worrisome physical exam findings include abnormal EKG chest pain. " The initial radiographic and laboratory data are worrisome because of elevated troponin. " The chronic co-morbidities include known CAD prior CABG.   * I certify that at the point of admission it is my clinical judgment that the patient will require inpatient hospital care spanning beyond 2 midnights from the point of admission due to high intensity of service, high risk for further deterioration and high frequency of surveillance required.*   For questions or updates, please contact [place] Please consult [email or web address] for contact  info under     Signed, Candee Furbish, MD  03/08/2021 1:59 PM

## 2021-03-08 NOTE — ED Provider Notes (Signed)
  Face-to-face evaluation  I was shown the patient's twelve-lead EKG at 12:38 PM.  I had not previously seen EKG.  I saw the patient at 12:40 PM.  History: She presents for evaluation of chest pain that started this morning, as she was lying down.  She had laid down about an hour before hand because she was feeling dizzy.  Her husband took a shower, and afterwards found her in the bed, complaining of chest pain.  He decided to call EMS.  While she was waiting for EMS she took 4 baby aspirins.  She is not sure if she took any nitroglycerin.  She states the chest pain went away after she took the aspirin.  EMS did not treat her during transport.  She has previously used nitroglycerin and has coronary artery disease.  She does not recall any other episodes requiring nitroglycerin, recently.  She denies shortness of breath, nausea, vomiting, weakness or dizziness.  Physical exam: Alert elderly female, who is calm and comfortable.  No respiratory distress.  No dysarthria or aphasia.  Chest nontender palpation.  Legs no edema.  Patient is lucid but a somewhat poor historian.  She defers many answers to her husband who was at the bedside.   EKG Interpretation  Date/Time:  Tuesday March 08 2021 11:59:56 EDT Ventricular Rate:  84 PR Interval:  208 QRS Duration: 91 QT Interval:  365 QTC Calculation: 432 R Axis:   47 Text Interpretation: Sinus rhythm Nonspecific repol abnormality, diffuse leads Baseline wander in lead(s) V5 Trace elevation aVR and diffuse ST depression. Confirmed by Daleen Bo [phone]) on 03/08/2021 12:46:52 PM         Medical screening examination/treatment/procedure(s) were conducted as a shared visit with non-physician practitioner(s) and myself.  I personally evaluated the patient during the encounter    Daleen Bo, MD 03/08/21 [place]

## 2021-03-08 NOTE — Interval H&P Note (Signed)
History and Physical Interval Note:  03/08/2021 4:05 PM  Rachel Clark  has presented today for surgery, with the diagnosis of chest pain.  The various methods of treatment have been discussed with the patient and family. After consideration of risks, benefits and other options for treatment, the patient has consented to  Procedure(s): LEFT HEART CATH AND CORS/GRAFTS ANGIOGRAPHY (N/A) as a surgical intervention.  The patient's history has been reviewed, patient examined, no change in status, stable for surgery.  I have reviewed the patient's chart and labs.  Questions were answered to the patient's satisfaction.   Cath Lab Visit (complete for each Cath Lab visit)  Clinical Evaluation Leading to the Procedure:   ACS: Yes.    Non-ACS:    Anginal Classification: CCS IV  Anti-ischemic medical therapy: Minimal Therapy (1 class of medications)  Non-Invasive Test Results: No non-invasive testing performed  Prior CABG: Previous CABG        Rachel Clark [facility] 03/08/2021 4:05 PM

## 2021-03-08 NOTE — ED Notes (Signed)
Per [facility], pt called reporting central CP, stabbing, non radiating, 7/10. Endorses taking 324 aspirin and 1 nitroglycerin and pain fully resolved

## 2021-03-08 NOTE — ED Triage Notes (Signed)
Pt arrives via [facility] with c/o central CP, took 324 aspirin and 1 nitro with full relief

## 2021-03-08 NOTE — ED Notes (Signed)
Hooked pt back up to monitor. Pt denies any pain or concerns at this time

## 2021-03-09 ENCOUNTER — Other Ambulatory Visit (HOSPITAL_COMMUNITY): Payer: Self-pay

## 2021-03-09 DIAGNOSIS — Z950 Presence of cardiac pacemaker: Secondary | ICD-10-CM | POA: Diagnosis not present

## 2021-03-09 DIAGNOSIS — Z79899 Other long term (current) drug therapy: Secondary | ICD-10-CM | POA: Diagnosis not present

## 2021-03-09 DIAGNOSIS — E039 Hypothyroidism, unspecified: Secondary | ICD-10-CM | POA: Diagnosis not present

## 2021-03-09 DIAGNOSIS — I10 Essential (primary) hypertension: Secondary | ICD-10-CM | POA: Diagnosis not present

## 2021-03-09 DIAGNOSIS — E785 Hyperlipidemia, unspecified: Secondary | ICD-10-CM | POA: Diagnosis not present

## 2021-03-09 DIAGNOSIS — Z7982 Long term (current) use of aspirin: Secondary | ICD-10-CM | POA: Diagnosis not present

## 2021-03-09 DIAGNOSIS — Z955 Presence of coronary angioplasty implant and graft: Secondary | ICD-10-CM | POA: Diagnosis not present

## 2021-03-09 DIAGNOSIS — Z951 Presence of aortocoronary bypass graft: Secondary | ICD-10-CM | POA: Diagnosis not present

## 2021-03-09 DIAGNOSIS — Z87891 Personal history of nicotine dependence: Secondary | ICD-10-CM | POA: Diagnosis not present

## 2021-03-09 DIAGNOSIS — I214 Non-ST elevation (NSTEMI) myocardial infarction: Secondary | ICD-10-CM | POA: Diagnosis not present

## 2021-03-09 DIAGNOSIS — I2510 Atherosclerotic heart disease of native coronary artery without angina pectoris: Secondary | ICD-10-CM | POA: Diagnosis not present

## 2021-03-09 DIAGNOSIS — Z85038 Personal history of other malignant neoplasm of large intestine: Secondary | ICD-10-CM | POA: Diagnosis not present

## 2021-03-09 DIAGNOSIS — Z20822 Contact with and (suspected) exposure to covid-19: Secondary | ICD-10-CM | POA: Diagnosis not present

## 2021-03-09 LAB — LIPID PANEL
Cholesterol: 157 mg/dL (ref 0–200)
HDL: 68 mg/dL (ref >40)
LDL Cholesterol: 72 mg/dL (ref 0–99)
Total CHOL/HDL Ratio: 2.3 RATIO
Triglycerides: 86 mg/dL (ref <150)
VLDL: 17 mg/dL (ref 0–40)

## 2021-03-09 LAB — BASIC METABOLIC PANEL
Anion gap: 7 (ref 5–15)
BUN: 12 mg/dL (ref 8–23)
CO2: 26 mmol/L (ref 22–32)
Calcium: 8.7 mg/dL — ABNORMAL LOW (ref 8.9–10.3)
Chloride: 99 mmol/L (ref 98–111)
Creatinine, Ser: 0.69 mg/dL (ref 0.44–1.00)
GFR, Estimated: >60 mL/min (ref >60)
Glucose, Bld: 102 mg/dL — ABNORMAL HIGH (ref 70–99)
Potassium: 3.9 mmol/L (ref 3.5–5.1)
Sodium: 132 mmol/L — ABNORMAL LOW (ref 135–145)

## 2021-03-09 LAB — CBC
HCT: 35.4 % — ABNORMAL LOW (ref 36.0–46.0)
Hemoglobin: 12.1 g/dL (ref 12.0–15.0)
MCH: 30.2 pg (ref 26.0–34.0)
MCHC: 34.2 g/dL (ref 30.0–36.0)
MCV: 88.3 fL (ref 80.0–100.0)
Platelets: 319 10*3/uL (ref 150–400)
RBC: 4.01 MIL/uL (ref 3.87–5.11)
RDW: 12.4 % (ref 11.5–15.5)
WBC: 8.5 10*3/uL (ref 4.0–10.5)
nRBC: 0 % (ref 0.0–0.2)

## 2021-03-09 MED ORDER — SODIUM CHLORIDE 0.9 % WEIGHT BASED INFUSION: 3.0000 mL/kg/h | INTRAVENOUS | Status: DC

## 2021-03-09 MED ORDER — PRASUGREL HCL 10 MG PO TABS
60.0000 mg | ORAL_TABLET | Freq: Once | ORAL | Status: AC
  Administered 2021-03-09: 60 mg via ORAL
  Filled 2021-03-09: qty 6

## 2021-03-09 MED ORDER — SODIUM CHLORIDE 0.9 % IV SOLN: 250.0000 mL | INTRAVENOUS | Status: DC | PRN

## 2021-03-09 MED ORDER — ASPIRIN 81 MG PO CHEW: 81.0000 mg | CHEWABLE_TABLET | ORAL | Status: DC

## 2021-03-09 MED ORDER — PRASUGREL HCL 10 MG PO TABS
10.0000 mg | ORAL_TABLET | Freq: Every day | ORAL | 3 refills | Status: DC
  Filled 2021-03-09: qty 90, 90d supply, fill #0

## 2021-03-09 MED ORDER — SODIUM CHLORIDE 0.9 % WEIGHT BASED INFUSION: 1.0000 mL/kg/h | INTRAVENOUS | Status: DC

## 2021-03-09 MED ORDER — SODIUM CHLORIDE 0.9% FLUSH: 3.0000 mL | INTRAVENOUS | Status: DC | PRN

## 2021-03-09 MED ORDER — PRASUGREL HCL 10 MG PO TABS: 10.0000 mg | ORAL_TABLET | Freq: Every day | ORAL | Status: DC

## 2021-03-09 MED ORDER — TICAGRELOR 90 MG PO TABS
90.0000 mg | ORAL_TABLET | Freq: Two times a day (BID) | ORAL | Status: DC
  Administered 2021-03-09: 90 mg via ORAL
  Filled 2021-03-09: qty 1

## 2021-03-09 NOTE — Progress Notes (Signed)
CARDIAC REHAB PHASE I   PRE:  Rate/Rhythm: 91 SR  BP:  Lying: 113/55      Sitting: 123/65      SaO2: 95 RA  MODE:  Ambulation: 250 ft   POST:  Rate/Rhythm: 112 ST  BP:  Sitting: 125/76  Lying: 120/58    SaO2: 94 RA   Pt agreeable to ambulate. Pt c/o dizziness when standing, took a seated break, BP stable. Pt then able to ambulate 258f in hallway assist of one with front wheel walker. Pt returned to room, denies CP or SOB. BP stable after walk. After talking some, pt c/o dizziness, BP rechecked and stable. Stent education completed with pt and family. Pt educated on importance of ASA and Effient. Pt given MI book along with heart healthy diet. Reviewed site care, restrictions, and exercise guidelines. Will refer to CRP II [place].  [id number] Falco RN BSN 03/09/2021 10:16 AM

## 2021-03-09 NOTE — Discharge Summary (Signed)
Discharge Summary    Patient ID: Rachel Clark MRN: [id number]; DOB: April 25, 1951  Admit date: 03/08/2021 Discharge date: 03/09/2021  PCP:  Harlan Stains, MD   [facility] Providers Cardiologist:  Peter [name], MD      Discharge Diagnoses    Principal Problem:   Chest pain Active Problems:   Coronary artery disease, post CABG 2007    Dyslipidemia   Essential hypertension    Diagnostic Studies/Procedures    Cath: 03/08/21  Prox LAD lesion is 100% stenosed.   Origin lesion is 100% stenosed.   Ost LAD to Prox LAD lesion is 60% stenosed.   Ost 1st Diag to 1st Diag lesion is 85% stenosed.   Ramus lesion is 99% stenosed.   Ost Ramus to Ramus lesion is 90% stenosed.   Non-stenotic Ost RCA to Mid RCA lesion was previously treated.   A drug-eluting stent was successfully placed using a STENT ONYX FRONTIER 3.0X15.   A drug-eluting stent was successfully placed using a SYNERGY XD 3.0X16.   A drug-eluting stent was successfully placed using a SYNERGY XD 3.0X20.   Post intervention, there is a 0% residual stenosis.   Post intervention, there is a 0% residual stenosis.   The left ventricular systolic function is normal.   LV end diastolic pressure is normal.   The left ventricular ejection fraction is 55-65% by visual estimate.   The radiation dose exceeded thresholds defined in the "Patient Radiation Dose Management For Interventional Medical Procedures With Extensive Use of Fluoroscopy" policy. Specific follow up instructions will be provided to the patient prior to discharge.   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 This is the culprit Normal LV function Normal LVEDP Successful PCI of the Ramus intermediate with DES x 3 in overlapping fashion.   Plan: DAPT for one year. Anticipate possible DC tomorrow.    Diagnostic Dominance: Right Intervention   _____________   History of Present Illness     Rachel Clark is a 70 y.o. 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  Earlier the day of admission at around 9 or 10 AM felt substernal chest pressure that was quite intense.  It lasted several minutes duration.  She took a full aspirin at the direction of EMS.  When EMS arrived, blood pressure was normal.  EKG strip showed mild diffuse ST segment depression with aVR elevation.  EKG in the emergency department was compatible with this.  Initial troponin was elevated at 39, mild.  She has battled with orthostatic hypotension/syncopal type episodes dizziness for quite some time.  Pacemaker function is normal by Dr. Rayann Heman.  Her isosorbide was previously discontinued.  Hydration may have improved some of the symptoms.  Echocardiogram has been unremarkable. With mildly elevated troponin while in the ED, she was admitted to cardiology with plans for cardiac cath.   Hospital Course     NSTEMI: hsTn peaked at 330. Underwent cardiac cath noted above with PCI/DES x3 in overlapping fashion. Patent LIMA-LAD, occlusion of  the SVG-RCA with continued patency of stents in the RCA. Normal LV function.  -- Initially loaded with Brilinta in the cath lab but in review of home meds included tegretol, therefore was transitioned to Effient with '60mg'$  load, then '10mg'$  daily. Reports a rash while on plavix in the past. Seen by CR.  -- continue coreg 3.'125mg'$  BID, ASA, Effient, Nexlizet  Orthostatic  Hypotension: long hx of the same. Reports her blood pressures have been relatively stable recently. Uses compression stockings, adequate oral intake and encouraged to continue Na+ intake.   HLD: Hx of statin intolerance -- followed by PharmD and recently started on Nexlizet -- LDL 72  S/p PPM: followed by Dr. Rayann Heman  Memory impairment: on namenda and tegretol   General: Well developed, well nourished, female appearing in no acute distress. Head: Normocephalic, atraumatic.  Neck: Supple without bruits, JVD. Lungs:  Resp regular and unlabored, CTA. Heart: RRR, S1, S2, no S3, S4, or murmur; no rub. Abdomen: Soft, non-tender, non-distended with normoactive bowel sounds. No hepatomegaly. No rebound/guarding. No obvious abdominal masses. Extremities: No clubbing, cyanosis, edema. Distal pedal pulses are 2+ bilaterally. Right femoral cath site stable without bruising or hematoma Neuro: Alert and oriented X 3. Moves all extremities spontaneously. Psych: Normal affect.  Patient was seen by Dr. [name] and deemed stable for discharge home. Follow up in the office has been arranged. Medications sent to the [facility] pharmacy. Educated by PharmD prior to discharge.   Did the patient have an acute coronary syndrome (MI, NSTEMI, STEMI, etc) this admission?:  Yes                               AHA/ACC Clinical Performance & Quality Measures: Aspirin prescribed? - Yes ADP Receptor Inhibitor (Plavix/Clopidogrel, Brilinta/Ticagrelor or Effient/Prasugrel) prescribed (includes medically managed patients)? - Yes Beta Blocker prescribed? - Yes High Intensity Statin (Lipitor 40-'80mg'$  or Crestor 20-'40mg'$ ) prescribed? - No - intolerant EF assessed during THIS hospitalization? - Yes For EF <40%, was ACEI/ARB prescribed? - Not Applicable (EF >/= [id number]) For EF <40%, Aldosterone Antagonist (Spironolactone or Eplerenone) prescribed? - Not Applicable (EF >/= [id number]) Cardiac Rehab Phase II ordered (including medically managed  patients)? - Yes      _____________  Discharge Vitals Blood pressure 124/73, pulse 92, temperature (!) 97.4 F (36.3 C), temperature source Oral, resp. rate 17, height '5\' 9"'$  (1.753 m), weight 77.1 kg, SpO2 99 %.  Filed Weights   03/08/21 1158  Weight: 77.1 kg    Labs & Radiologic Studies    CBC Recent Labs    03/08/21 1155 03/08/21 1655 03/09/21 0218  WBC 6.6  --  8.5  HGB 13.0 13.6 12.1  HCT 38.6 40.0 35.4*  MCV 90.2  --  88.3  PLT 368  --  99991111   Basic Metabolic Panel Recent Labs    03/08/21 1155 03/08/21 1655 03/09/21 0218  NA 130* 105* 132*  K 4.0 3.0* 3.9  CL 98  --  99  CO2 25  --  26  GLUCOSE 122*  --  102*  BUN 14  --  12  CREATININE 0.71  --  0.69  CALCIUM 8.6*  --  8.7*   Liver Function Tests No results for input(s): AST, ALT, ALKPHOS, BILITOT, PROT, ALBUMIN in the last 72 hours. No results for input(s): LIPASE, AMYLASE in the last 72 hours. High Sensitivity Troponin:   Recent Labs  Lab 03/08/21 1155 03/08/21 1404  TROPONINIHS 39*  330*    BNP Invalid input(s): POCBNP D-Dimer No results for input(s): DDIMER in the last 72 hours. Hemoglobin A1C No results for input(s): HGBA1C in the last 72 hours. Fasting Lipid Panel Recent Labs    03/09/21 0218  CHOL 157  HDL 68  LDLCALC 72  TRIG 86  CHOLHDL 2.3   Thyroid Function Tests No results for input(s): TSH, T4TOTAL, T3FREE, THYROIDAB in the last 72 hours.  Invalid input(s): FREET3 _____________  DG Chest 2 View  Result Date: 03/08/2021 CLINICAL DATA:  CP EXAM: CHEST - 2 VIEW COMPARISON:  Nov 10, 2020. FINDINGS: Similar cardiomediastinal silhouette. Left subclavian approach cardiac rhythm maintenance device in similar position. CABG and median sternotomy. Coronary stenting. Both lungs are clear. No visible pleural effusions or pneumothorax. No acute osseous abnormality. IMPRESSION: No active cardiopulmonary disease. Electronically Signed   By: Margaretha Sheffield M.D.   On: 03/08/2021 13:30    CARDIAC CATHETERIZATION  Result Date: 03/08/2021   Prox LAD lesion is 100% stenosed.   Origin lesion is 100% stenosed.   Ost LAD to Prox LAD lesion is 60% stenosed.   Ost 1st Diag to 1st Diag lesion is 85% stenosed.   Ramus lesion is 99% stenosed.   Ost Ramus to Ramus lesion is 90% stenosed.   Non-stenotic Ost RCA to Mid RCA lesion was previously treated.   A drug-eluting stent was successfully placed using a STENT ONYX FRONTIER 3.0X15.   A drug-eluting stent was successfully placed using a SYNERGY XD 3.0X16.   A drug-eluting stent was successfully placed using a SYNERGY XD 3.0X20.   Post intervention, there is a 0% residual stenosis.   Post intervention, there is a 0% residual stenosis.   The left ventricular systolic function is normal.   LV end diastolic pressure is normal.   The left ventricular ejection fraction is 55-65% by visual estimate.   The radiation dose exceeded thresholds defined in the "Patient Radiation Dose Management For Interventional Medical Procedures With Extensive Use of Fluoroscopy" policy. Specific follow up instructions will be provided to the patient prior to discharge. 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 This is the culprit Normal LV function Normal LVEDP Successful PCI of the Ramus intermediate with DES x 3 in overlapping fashion. Plan: DAPT for one year. Anticipate possible DC tomorrow.   Disposition   Pt is being discharged home today in good condition.  Follow-up Plans & Appointments     Follow-up Information     [name], Peter M, MD Follow up on 03/28/2021.   Specialty: Cardiology Why: at 10am for your follow up appt. Contact information: [address] [place] [address] [phone]                Discharge Instructions     Call MD for:  difficulty breathing, headache or visual  disturbances   Complete by: As directed    Call MD for:  persistant dizziness or light-headedness   Complete by: As directed    Call MD for:  redness, tenderness, or signs of infection (pain, swelling, redness, odor or green/yellow discharge around incision site)   Complete by: As directed    Diet - low sodium heart healthy   Complete by: As directed    Discharge instructions   Complete by: As directed    Groin Site Care Refer  to this sheet in the next few weeks. These instructions provide you with information on caring for yourself after your procedure. Your caregiver may also give you more specific instructions. Your treatment has been planned according to current medical practices, but problems sometimes occur. Call your caregiver if you have any problems or questions after your procedure. HOME CARE INSTRUCTIONS You may shower 24 hours after the procedure. Remove the bandage (dressing) and gently wash the site with plain soap and water. Gently pat the site dry.  Do not apply powder or lotion to the site.  Do not sit in a bathtub, swimming pool, or whirlpool for 5 to 7 days.  No bending, squatting, or lifting anything over 10 pounds (4.5 kg) as directed by your caregiver.  Inspect the site at least twice daily.  Do not drive home if you are discharged the same day of the procedure. Have someone else drive you.  You may drive 24 hours after the procedure unless otherwise instructed by your caregiver.  What to expect: Any bruising will usually fade within 1 to 2 weeks.  Blood that collects in the tissue (hematoma) may be painful to the touch. It should usually decrease in size and tenderness within 1 to 2 weeks.  SEEK IMMEDIATE MEDICAL CARE IF: You have unusual pain at the groin site or down the affected leg.  You have redness, warmth, swelling, or pain at the groin site.  You have drainage (other than a small amount of blood on the dressing).  You have chills.  You have a fever or  persistent symptoms for more than 72 hours.  You have a fever and your symptoms suddenly get worse.  Your leg becomes pale, cool, tingly, or numb.  You have heavy bleeding from the site. Hold pressure on the site. Marland Kitchen  PLEASE DO NOT MISS ANY DOSES OF YOUR EFFIENT!!!!! Also keep a log of you blood pressures and bring back to your follow up appt. Please call the office with any questions.   Patients taking blood thinners should generally stay away from medicines like ibuprofen, Advil, Motrin, naproxen, and Aleve due to risk of stomach bleeding. You may take Tylenol as directed or talk to your primary doctor about alternatives.   PLEASE ENSURE THAT YOU DO NOT RUN OUT OF YOUR EFFIENT. This medication is very important to remain on for at least one year. IF you have issues obtaining this medication due to cost please CALL the office 3-5 business days prior to running out in order to prevent missing doses of this medication.   Increase activity slowly   Complete by: As directed        Discharge Medications   Allergies as of 03/09/2021       Reactions   Aspirin Other (See Comments)   GI upset- can tolerate 81 mg ASA, just not full doses   Capecitabine    Other reaction(s): diarrhea   Crestor [rosuvastatin]    myalgias   Erythromycin Nausea Only   Gi upset   Fluticasone    Other reaction(s): sore throat   Lipitor [atorvastatin]    myalgias   Lisinopril Cough   Niacin And Related Other (See Comments)   Pt does not recall reactions    Statins Other (See Comments)   Muscle pain   Sulfa Drugs Cross Reactors Swelling   Sulfamethoxazole    Other reaction(s): anaphylaxis   Tetracyclines & Related Nausea Only   Penicillins Nausea Only, Rash   Has patient had a  PCN reaction causing immediate rash, facial/tongue/throat swelling, SOB or lightheadedness with hypotension: Yes Has patient had a PCN reaction causing severe rash involving mucus membranes or skin necrosis: No Has patient had a PCN  reaction that required hospitalization No Has patient had a PCN reaction occurring within the last 10 years: No If all of the above answers are "NO", then may proceed with Cephalosporin use.   Plavix [clopidogrel Bisulfate] Rash        Medication List     TAKE these medications    acetaminophen 500 MG tablet Commonly known as: TYLENOL Take 500-1,000 mg by mouth daily as needed for mild pain or headache.   aspirin 81 MG chewable tablet Chew 1 tablet (81 mg total) by mouth daily.   Azelastine HCl 0.15 % Soln Place 1 spray into both nostrils daily as needed (seasonal allergies).   carbamazepine 200 MG 12 hr tablet Commonly known as: TEGRETOL XR Take 3 tablets (600 mg total) by mouth 2 (two) times daily.   carvedilol 3.125 MG tablet Commonly known as: COREG Take 1 tablet (3.125 mg total) by mouth 2 (two) times daily.   DULoxetine 60 MG capsule Commonly known as: CYMBALTA Take 60 mg by mouth daily.   ethosuximide 250 MG capsule Commonly known as: ZARONTIN TAKE 2 CAPSULES IN THE MORNING, TAKE 2 CAPSULES AT NIGHT   levothyroxine 50 MCG tablet Commonly known as: SYNTHROID Take 50 mcg by mouth daily.   memantine 10 MG tablet Commonly known as: Namenda Take 1 tablet (10 mg total) by mouth 2 (two) times daily.   Nexlizet 180-10 MG Tabs Generic drug: Bempedoic Acid-Ezetimibe Take 180 mg by mouth daily.   nitroGLYCERIN 0.4 MG SL tablet Commonly known as: NITROSTAT Place 1 tablet (0.4 mg total) under the tongue every 5 (five) minutes as needed. For chest pain.   prasugrel 10 MG Tabs tablet Commonly known as: EFFIENT Take 1 tablet (10 mg total) by mouth daily. Start taking on: March 10, 2021   tiZANidine 2 MG tablet Commonly known as: ZANAFLEX Take by mouth every 6 (six) hours as needed for muscle spasms.           Outstanding Labs/Studies   N/a   Duration of Discharge Encounter   Greater than 30 minutes including physician time.  Signed, Reino Bellis, NP 03/09/2021, 9:14 AM

## 2021-03-09 NOTE — Plan of Care (Signed)
Problem: Education: Goal: Knowledge of General Education information will improve Description: Including pain rating scale, medication(s)/side effects and non-pharmacologic comfort measures 03/09/2021 1259 by Carlynn Spry, RN Outcome: Adequate for Discharge 03/09/2021 1259 by Carlynn Spry, RN Outcome: Adequate for Discharge   Problem: Health Behavior/Discharge Planning: Goal: Ability to manage health-related needs will improve 03/09/2021 1259 by Carlynn Spry, RN Outcome: Adequate for Discharge 03/09/2021 1259 by Carlynn Spry, RN Outcome: Adequate for Discharge   Problem: Clinical Measurements: Goal: Ability to maintain clinical measurements within normal limits will improve 03/09/2021 1259 by Carlynn Spry, RN Outcome: Adequate for Discharge 03/09/2021 1259 by Carlynn Spry, RN Outcome: Adequate for Discharge Goal: Will remain free from infection 03/09/2021 1259 by Carlynn Spry, RN Outcome: Adequate for Discharge 03/09/2021 1259 by Carlynn Spry, RN Outcome: Adequate for Discharge Goal: Diagnostic test results will improve 03/09/2021 1259 by Carlynn Spry, RN Outcome: Adequate for Discharge 03/09/2021 1259 by Carlynn Spry, RN Outcome: Adequate for Discharge Goal: Respiratory complications will improve 03/09/2021 1259 by Carlynn Spry, RN Outcome: Adequate for Discharge 03/09/2021 1259 by Carlynn Spry, RN Outcome: Adequate for Discharge Goal: Cardiovascular complication will be avoided 03/09/2021 1259 by Carlynn Spry, RN Outcome: Adequate for Discharge 03/09/2021 1259 by Carlynn Spry, RN Outcome: Adequate for Discharge   Problem: Activity: Goal: Risk for activity intolerance will decrease 03/09/2021 1259 by Carlynn Spry, RN Outcome: Adequate for Discharge 03/09/2021 1259 by Carlynn Spry, RN Outcome: Adequate for Discharge   Problem: Nutrition: Goal: Adequate nutrition will be  maintained 03/09/2021 1259 by Carlynn Spry, RN Outcome: Adequate for Discharge 03/09/2021 1259 by Carlynn Spry, RN Outcome: Adequate for Discharge   Problem: Coping: Goal: Level of anxiety will decrease 03/09/2021 1259 by Carlynn Spry, RN Outcome: Adequate for Discharge 03/09/2021 1259 by Carlynn Spry, RN Outcome: Adequate for Discharge   Problem: Elimination: Goal: Will not experience complications related to bowel motility 03/09/2021 1259 by Carlynn Spry, RN Outcome: Adequate for Discharge 03/09/2021 1259 by Carlynn Spry, RN Outcome: Adequate for Discharge Goal: Will not experience complications related to urinary retention 03/09/2021 1259 by Carlynn Spry, RN Outcome: Adequate for Discharge 03/09/2021 1259 by Carlynn Spry, RN Outcome: Adequate for Discharge   Problem: Pain Managment: Goal: General experience of comfort will improve 03/09/2021 1259 by Carlynn Spry, RN Outcome: Adequate for Discharge 03/09/2021 1259 by Carlynn Spry, RN Outcome: Adequate for Discharge   Problem: Safety: Goal: Ability to remain free from injury will improve 03/09/2021 1259 by Carlynn Spry, RN Outcome: Adequate for Discharge 03/09/2021 1259 by Carlynn Spry, RN Outcome: Adequate for Discharge   Problem: Skin Integrity: Goal: Risk for impaired skin integrity will decrease 03/09/2021 1259 by Carlynn Spry, RN Outcome: Adequate for Discharge 03/09/2021 1259 by Carlynn Spry, RN Outcome: Adequate for Discharge   Problem: Education: Goal: Understanding of CV disease, CV risk reduction, and recovery process will improve 03/09/2021 1259 by Carlynn Spry, RN Outcome: Adequate for Discharge 03/09/2021 1259 by Carlynn Spry, RN Outcome: Adequate for Discharge Goal: Individualized Educational Video(s) 03/09/2021 1259 by Carlynn Spry, RN Outcome: Adequate for Discharge 03/09/2021 1259 by Carlynn Spry,  RN Outcome: Adequate for Discharge   Problem: Activity: Goal: Ability to return to baseline activity level will improve 03/09/2021 1259 by Carlynn Spry, RN Outcome: Adequate for Discharge 03/09/2021 1259 by Carlynn Spry, RN Outcome: Adequate for Discharge  Problem: Cardiovascular: Goal: Ability to achieve and maintain adequate cardiovascular perfusion will improve 03/09/2021 1259 by Carlynn Spry, RN Outcome: Adequate for Discharge 03/09/2021 1259 by Carlynn Spry, RN Outcome: Adequate for Discharge Goal: Vascular access site(s) Level 0-1 will be maintained 03/09/2021 1259 by Carlynn Spry, RN Outcome: Adequate for Discharge 03/09/2021 1259 by Carlynn Spry, RN Outcome: Adequate for Discharge   Problem: Health Behavior/Discharge Planning: Goal: Ability to safely manage health-related needs after discharge will improve 03/09/2021 1259 by Carlynn Spry, RN Outcome: Adequate for Discharge 03/09/2021 1259 by Carlynn Spry, RN Outcome: Adequate for Discharge

## 2021-03-09 NOTE — TOC Benefit Eligibility Note (Signed)
Patient Teacher, English as a foreign language completed.    The patient is currently admitted and upon discharge could be taking Effient (prasugrel) 10 mg tab.  The current 30 day co-pay is, $47.00.   The patient is insured through [employer] [place] Patient Advocate Specialist Mower Team Direct Number: [phone]  Fax: [phone]

## 2021-03-09 NOTE — Plan of Care (Signed)
  Problem: Clinical Measurements: Goal: Ability to maintain clinical measurements within normal limits will improve 03/09/2021 0312 by Colonel Bald, RN Outcome: Progressing 03/09/2021 0303 by Colonel Bald, RN Outcome: Progressing Goal: Will remain free from infection 03/09/2021 0312 by Colonel Bald, RN Outcome: Progressing 03/09/2021 0303 by Colonel Bald, RN Outcome: Progressing Goal: Diagnostic test results will improve 03/09/2021 0312 by Colonel Bald, RN Outcome: Progressing 03/09/2021 0303 by Colonel Bald, RN Outcome: Progressing Goal: Respiratory complications will improve 03/09/2021 0312 by Colonel Bald, RN Outcome: Progressing 03/09/2021 0303 by Colonel Bald, RN Outcome: Progressing Goal: Cardiovascular complication will be avoided 03/09/2021 0312 by Colonel Bald, RN Outcome: Progressing 03/09/2021 0303 by Colonel Bald, RN Outcome: Progressing

## 2021-03-09 NOTE — Plan of Care (Signed)

## 2021-03-10 MED FILL — Nitroglycerin IV Soln 200 MCG/ML in D5W: INTRAVENOUS | Qty: 250 | Status: AC

## 2021-03-11 ENCOUNTER — Telehealth (HOSPITAL_COMMUNITY): Payer: Self-pay

## 2021-03-11 NOTE — Telephone Encounter (Signed)
Called patient to see if she is interested in the Cardiac Rehab Program. Patient expressed interest. Explained scheduling process and went over insurance, patient verbalized understanding. Will contact patient for scheduling once f/u has been completed. 

## 2021-03-11 NOTE — Telephone Encounter (Signed)
Pt insurance is active and benefits verified through Medicare a/b Co-pay 0, DED $233/$233 met, out of pocket 0/0 met, co-insurance 20%. no pre-authorization required. Passport, 03/11/2021_0 :52pm, REF# [id number]  2ndary insurance is active and benefits verified through [employer]. Co-pay 0, DED 0/0 met, out of pocket 0/0 met, co-insurance 0%. No pre-authorization required. Passport, 03/11/2021_1 :58pm, REF# [id number]  Will contact patient to see if she is interested in the Cardiac Rehab Program. If interested, patient will need to complete follow up appt. Once completed, patient will be contacted for scheduling upon review by the RN Navigator.

## 2021-03-13 ENCOUNTER — Emergency Department (HOSPITAL_COMMUNITY): Payer: Medicare Other

## 2021-03-13 ENCOUNTER — Other Ambulatory Visit: Payer: Self-pay

## 2021-03-13 ENCOUNTER — Inpatient Hospital Stay (HOSPITAL_COMMUNITY)
Admission: EM | Admit: 2021-03-13 | Discharge: 2021-03-17 | DRG: 392 | Disposition: A | Discharge status: Home / Self Care | Payer: Medicare Other | Attending: Internal Medicine | Admitting: Internal Medicine

## 2021-03-13 ENCOUNTER — Encounter (HOSPITAL_COMMUNITY): Payer: Self-pay | Admitting: Emergency Medicine

## 2021-03-13 DIAGNOSIS — K921 Melena: Secondary | ICD-10-CM | POA: Diagnosis not present

## 2021-03-13 DIAGNOSIS — R1111 Vomiting without nausea: Secondary | ICD-10-CM | POA: Diagnosis not present

## 2021-03-13 DIAGNOSIS — Z8711 Personal history of peptic ulcer disease: Secondary | ICD-10-CM

## 2021-03-13 DIAGNOSIS — I951 Orthostatic hypotension: Secondary | ICD-10-CM | POA: Diagnosis present

## 2021-03-13 DIAGNOSIS — M797 Fibromyalgia: Secondary | ICD-10-CM | POA: Diagnosis present

## 2021-03-13 DIAGNOSIS — I959 Hypotension, unspecified: Secondary | ICD-10-CM | POA: Diagnosis not present

## 2021-03-13 DIAGNOSIS — E785 Hyperlipidemia, unspecified: Secondary | ICD-10-CM | POA: Diagnosis present

## 2021-03-13 DIAGNOSIS — Z7902 Long term (current) use of antithrombotics/antiplatelets: Secondary | ICD-10-CM | POA: Diagnosis not present

## 2021-03-13 DIAGNOSIS — Z9049 Acquired absence of other specified parts of digestive tract: Secondary | ICD-10-CM | POA: Diagnosis not present

## 2021-03-13 DIAGNOSIS — Z7982 Long term (current) use of aspirin: Secondary | ICD-10-CM

## 2021-03-13 DIAGNOSIS — K920 Hematemesis: Secondary | ICD-10-CM | POA: Diagnosis present

## 2021-03-13 DIAGNOSIS — R0602 Shortness of breath: Secondary | ICD-10-CM | POA: Diagnosis not present

## 2021-03-13 DIAGNOSIS — I10 Essential (primary) hypertension: Secondary | ICD-10-CM | POA: Diagnosis not present

## 2021-03-13 DIAGNOSIS — K209 Esophagitis, unspecified without bleeding: Secondary | ICD-10-CM | POA: Diagnosis not present

## 2021-03-13 DIAGNOSIS — Z8249 Family history of ischemic heart disease and other diseases of the circulatory system: Secondary | ICD-10-CM | POA: Diagnosis not present

## 2021-03-13 DIAGNOSIS — Z950 Presence of cardiac pacemaker: Secondary | ICD-10-CM | POA: Diagnosis not present

## 2021-03-13 DIAGNOSIS — I442 Atrioventricular block, complete: Secondary | ICD-10-CM | POA: Diagnosis not present

## 2021-03-13 DIAGNOSIS — Z880 Allergy status to penicillin: Secondary | ICD-10-CM

## 2021-03-13 DIAGNOSIS — K922 Gastrointestinal hemorrhage, unspecified: Secondary | ICD-10-CM | POA: Diagnosis not present

## 2021-03-13 DIAGNOSIS — Z882 Allergy status to sulfonamides status: Secondary | ICD-10-CM

## 2021-03-13 DIAGNOSIS — Z20822 Contact with and (suspected) exposure to covid-19: Secondary | ICD-10-CM | POA: Diagnosis present

## 2021-03-13 DIAGNOSIS — I2510 Atherosclerotic heart disease of native coronary artery without angina pectoris: Secondary | ICD-10-CM | POA: Diagnosis present

## 2021-03-13 DIAGNOSIS — G8929 Other chronic pain: Secondary | ICD-10-CM | POA: Diagnosis present

## 2021-03-13 DIAGNOSIS — E039 Hypothyroidism, unspecified: Secondary | ICD-10-CM | POA: Diagnosis not present

## 2021-03-13 DIAGNOSIS — Z85038 Personal history of other malignant neoplasm of large intestine: Secondary | ICD-10-CM

## 2021-03-13 DIAGNOSIS — K2100 Gastro-esophageal reflux disease with esophagitis, without bleeding: Principal | ICD-10-CM | POA: Diagnosis present

## 2021-03-13 DIAGNOSIS — Z881 Allergy status to other antibiotic agents status: Secondary | ICD-10-CM

## 2021-03-13 DIAGNOSIS — R000 Tachycardia, unspecified: Secondary | ICD-10-CM | POA: Diagnosis not present

## 2021-03-13 DIAGNOSIS — Z955 Presence of coronary angioplasty implant and graft: Secondary | ICD-10-CM | POA: Diagnosis not present

## 2021-03-13 DIAGNOSIS — Z87891 Personal history of nicotine dependence: Secondary | ICD-10-CM

## 2021-03-13 DIAGNOSIS — G40409 Other generalized epilepsy and epileptic syndromes, not intractable, without status epilepticus: Secondary | ICD-10-CM | POA: Diagnosis present

## 2021-03-13 DIAGNOSIS — Z888 Allergy status to other drugs, medicaments and biological substances status: Secondary | ICD-10-CM

## 2021-03-13 DIAGNOSIS — F32A Depression, unspecified: Secondary | ICD-10-CM | POA: Diagnosis not present

## 2021-03-13 DIAGNOSIS — D62 Acute posthemorrhagic anemia: Secondary | ICD-10-CM | POA: Diagnosis not present

## 2021-03-13 DIAGNOSIS — K2101 Gastro-esophageal reflux disease with esophagitis, with bleeding: Secondary | ICD-10-CM | POA: Diagnosis not present

## 2021-03-13 DIAGNOSIS — I252 Old myocardial infarction: Secondary | ICD-10-CM

## 2021-03-13 DIAGNOSIS — Z886 Allergy status to analgesic agent status: Secondary | ICD-10-CM

## 2021-03-13 DIAGNOSIS — Z90710 Acquired absence of both cervix and uterus: Secondary | ICD-10-CM | POA: Diagnosis not present

## 2021-03-13 DIAGNOSIS — Z79890 Hormone replacement therapy (postmenopausal): Secondary | ICD-10-CM

## 2021-03-13 DIAGNOSIS — Z79899 Other long term (current) drug therapy: Secondary | ICD-10-CM

## 2021-03-13 DIAGNOSIS — M545 Low back pain, unspecified: Secondary | ICD-10-CM | POA: Diagnosis present

## 2021-03-13 DIAGNOSIS — R1110 Vomiting, unspecified: Secondary | ICD-10-CM | POA: Diagnosis not present

## 2021-03-13 LAB — COMPREHENSIVE METABOLIC PANEL
ALT: 14 U/L (ref 0–44)
AST: 17 U/L (ref 15–41)
Albumin: 3.3 g/dL — ABNORMAL LOW (ref 3.5–5.0)
Alkaline Phosphatase: 43 U/L (ref 38–126)
Anion gap: 8 (ref 5–15)
BUN: 39 mg/dL — ABNORMAL HIGH (ref 8–23)
CO2: 23 mmol/L (ref 22–32)
Calcium: 8.4 mg/dL — ABNORMAL LOW (ref 8.9–10.3)
Chloride: 103 mmol/L (ref 98–111)
Creatinine, Ser: 0.68 mg/dL (ref 0.44–1.00)
GFR, Estimated: >60 mL/min (ref >60)
Glucose, Bld: 127 mg/dL — ABNORMAL HIGH (ref 70–99)
Potassium: 4.9 mmol/L (ref 3.5–5.1)
Sodium: 134 mmol/L — ABNORMAL LOW (ref 135–145)
Total Bilirubin: 0.5 mg/dL (ref 0.3–1.2)
Total Protein: 6.5 g/dL (ref 6.5–8.1)

## 2021-03-13 LAB — I-STAT CHEM 8, ED
BUN: 42 mg/dL — ABNORMAL HIGH (ref 8–23)
Calcium, Ion: 1.13 mmol/L — ABNORMAL LOW (ref 1.15–1.40)
Chloride: 103 mmol/L (ref 98–111)
Creatinine, Ser: 0.6 mg/dL (ref 0.44–1.00)
Glucose, Bld: 122 mg/dL — ABNORMAL HIGH (ref 70–99)
HCT: 35 % — ABNORMAL LOW (ref 36.0–46.0)
Hemoglobin: 11.9 g/dL — ABNORMAL LOW (ref 12.0–15.0)
Potassium: 4.8 mmol/L (ref 3.5–5.1)
Sodium: 135 mmol/L (ref 135–145)
TCO2: 24 mmol/L (ref 22–32)

## 2021-03-13 LAB — CBC WITH DIFFERENTIAL/PLATELET
Abs Immature Granulocytes: 0.07 10*3/uL (ref 0.00–0.07)
Basophils Absolute: 0 10*3/uL (ref 0.0–0.1)
Basophils Relative: 0 %
Eosinophils Absolute: 0.1 10*3/uL (ref 0.0–0.5)
Eosinophils Relative: 1 %
HCT: 35.9 % — ABNORMAL LOW (ref 36.0–46.0)
Hemoglobin: 12 g/dL (ref 12.0–15.0)
Immature Granulocytes: 1 %
Lymphocytes Relative: 15 %
Lymphs Abs: 1.6 10*3/uL (ref 0.7–4.0)
MCH: 30.4 pg (ref 26.0–34.0)
MCHC: 33.4 g/dL (ref 30.0–36.0)
MCV: 90.9 fL (ref 80.0–100.0)
Monocytes Absolute: 0.6 10*3/uL (ref 0.1–1.0)
Monocytes Relative: 5 %
Neutro Abs: 8.5 10*3/uL — ABNORMAL HIGH (ref 1.7–7.7)
Neutrophils Relative %: 78 %
Platelets: 377 10*3/uL (ref 150–400)
RBC: 3.95 MIL/uL (ref 3.87–5.11)
RDW: 12.6 % (ref 11.5–15.5)
WBC: 10.9 10*3/uL — ABNORMAL HIGH (ref 4.0–10.5)
nRBC: 0 % (ref 0.0–0.2)

## 2021-03-13 LAB — LIPASE, BLOOD: Lipase: 22 U/L (ref 11–51)

## 2021-03-13 LAB — HEMOGLOBIN AND HEMATOCRIT, BLOOD
HCT: 29.8 % — ABNORMAL LOW (ref 36.0–46.0)
Hemoglobin: 10.2 g/dL — ABNORMAL LOW (ref 12.0–15.0)

## 2021-03-13 LAB — PROTIME-INR
INR: 1.1 (ref 0.8–1.2)
Prothrombin Time: 13.7 seconds (ref 11.4–15.2)

## 2021-03-13 LAB — POC OCCULT BLOOD, ED: Fecal Occult Bld: POSITIVE — AB

## 2021-03-13 LAB — LACTIC ACID, PLASMA: Lactic Acid, Venous: 1.7 mmol/L (ref 0.5–1.9)

## 2021-03-13 MED ORDER — PANTOPRAZOLE SODIUM 40 MG IV SOLR
40.0000 mg | Freq: Once | INTRAVENOUS | Status: AC
Start: 2021-03-13 — End: 2021-03-13
  Administered 2021-03-13: 40 mg via INTRAVENOUS
  Filled 2021-03-13: qty 40

## 2021-03-13 MED ORDER — TIZANIDINE HCL 4 MG PO TABS
2.0000 mg | ORAL_TABLET | Freq: Four times a day (QID) | ORAL | Status: DC | PRN
  Administered 2021-03-15: 2 mg via ORAL
  Filled 2021-03-13: qty 1

## 2021-03-13 MED ORDER — ASPIRIN 81 MG PO CHEW
81.0000 mg | CHEWABLE_TABLET | Freq: Every day | ORAL | Status: DC
  Administered 2021-03-13 – 2021-03-17 (×4): 81 mg via ORAL
  Filled 2021-03-13 (×4): qty 1

## 2021-03-13 MED ORDER — PRASUGREL HCL 10 MG PO TABS
10.0000 mg | ORAL_TABLET | Freq: Every day | ORAL | Status: DC
  Administered 2021-03-13 – 2021-03-17 (×4): 10 mg via ORAL
  Filled 2021-03-13 (×5): qty 1

## 2021-03-13 MED ORDER — LACTATED RINGERS IV BOLUS
1000.0000 mL | Freq: Once | INTRAVENOUS | Status: AC
  Administered 2021-03-13: 1000 mL via INTRAVENOUS

## 2021-03-13 MED ORDER — BEMPEDOIC ACID-EZETIMIBE 180-10 MG PO TABS: 180.0000 mg | ORAL_TABLET | Freq: Every day | ORAL | Status: DC

## 2021-03-13 MED ORDER — HYDRALAZINE HCL 20 MG/ML IJ SOLN
5.0000 mg | Freq: Four times a day (QID) | INTRAMUSCULAR | Status: DC | PRN
  Administered 2021-03-14: 5 mg via INTRAVENOUS
  Filled 2021-03-13: qty 1

## 2021-03-13 MED ORDER — AZELASTINE HCL 0.1 % NA SOLN: 1.0000 | Freq: Every day | NASAL | Status: DC | PRN

## 2021-03-13 MED ORDER — LEVOTHYROXINE SODIUM 50 MCG PO TABS
50.0000 ug | ORAL_TABLET | Freq: Every day | ORAL | Status: DC
  Administered 2021-03-13 – 2021-03-17 (×5): 50 ug via ORAL
  Filled 2021-03-13 (×3): qty 1
  Filled 2021-03-13 (×2): qty 2

## 2021-03-13 MED ORDER — ETHOSUXIMIDE 250 MG PO CAPS: 500.0000 mg | ORAL_CAPSULE | Freq: Every day | ORAL | Status: DC

## 2021-03-13 MED ORDER — SODIUM CHLORIDE 0.9 % IV SOLN: INTRAVENOUS | Status: AC

## 2021-03-13 MED ORDER — EZETIMIBE 10 MG PO TABS
10.0000 mg | ORAL_TABLET | Freq: Every day | ORAL | Status: DC
  Administered 2021-03-13 – 2021-03-17 (×4): 10 mg via ORAL
  Filled 2021-03-13 (×4): qty 1

## 2021-03-13 MED ORDER — PANTOPRAZOLE SODIUM 40 MG IV SOLR
40.0000 mg | Freq: Two times a day (BID) | INTRAVENOUS | Status: DC
  Administered 2021-03-13: 40 mg via INTRAVENOUS
  Filled 2021-03-13: qty 40

## 2021-03-13 MED ORDER — ONDANSETRON HCL 4 MG PO TABS: 4.0000 mg | ORAL_TABLET | Freq: Four times a day (QID) | ORAL | Status: DC | PRN

## 2021-03-13 MED ORDER — ETHOSUXIMIDE 250 MG PO CAPS
500.0000 mg | ORAL_CAPSULE | Freq: Two times a day (BID) | ORAL | Status: DC
  Administered 2021-03-14 – 2021-03-17 (×7): 500 mg via ORAL
  Filled 2021-03-13 (×10): qty 2

## 2021-03-13 MED ORDER — ACETAMINOPHEN 500 MG PO TABS: 500.0000 mg | ORAL_TABLET | Freq: Every day | ORAL | Status: DC | PRN

## 2021-03-13 MED ORDER — DULOXETINE HCL 60 MG PO CPEP
60.0000 mg | ORAL_CAPSULE | Freq: Every day | ORAL | Status: DC
  Administered 2021-03-13 – 2021-03-17 (×4): 60 mg via ORAL
  Filled 2021-03-13 (×4): qty 1

## 2021-03-13 MED ORDER — ONDANSETRON HCL 4 MG/2ML IJ SOLN: 4.0000 mg | Freq: Four times a day (QID) | INTRAMUSCULAR | Status: DC | PRN

## 2021-03-13 MED ORDER — AZELASTINE HCL 0.15 % NA SOLN: 1.0000 | Freq: Every day | NASAL | Status: DC | PRN

## 2021-03-13 MED ORDER — MEMANTINE HCL 10 MG PO TABS
10.0000 mg | ORAL_TABLET | Freq: Two times a day (BID) | ORAL | Status: DC
  Administered 2021-03-13 – 2021-03-17 (×7): 10 mg via ORAL
  Filled 2021-03-13 (×9): qty 1

## 2021-03-13 MED ORDER — CARBAMAZEPINE ER 200 MG PO TB12
600.0000 mg | ORAL_TABLET | Freq: Two times a day (BID) | ORAL | Status: DC
  Administered 2021-03-13 – 2021-03-17 (×7): 600 mg via ORAL
  Filled 2021-03-13 (×9): qty 3

## 2021-03-13 NOTE — ED Notes (Signed)
Rewrapped right IV, flushes well.

## 2021-03-13 NOTE — ED Triage Notes (Signed)
Pt to triage via [facility].  Pt stayed on EMS stretcher, report received and pt straight to treatment room 6.  Reports coffee ground emesis since 8:30am.  Pt denies pain.  BP 81/64.  20 g L FA.  NS 300cc.  HR 112, CBG 121.

## 2021-03-13 NOTE — ED Notes (Addendum)
Ambulated patient to the bathroom with minimal stand-by assistance. Upon completion of urinating she felt dizzy and light-headed but made it the 72f to her room and hooked up to telemetry monitor. She was tachycardic ST with rate 140 and quickly returned closer to 100-110, 159/80, RR 24 and mildly tachypneic.

## 2021-03-13 NOTE — Consult Note (Signed)
Cardiology Consultation:   Patient ID: Rachel Clark; [id number]; 06-07-1951   Admit date: 03/13/2021 Date of Consult: 03/13/2021  Primary Care Provider: Harlan Stains, MD Primary Cardiologist: Peter [name], MD  Primary Electrophysiologist:  Dr. Rayann Heman   Patient Profile:   Rachel Clark is a 70 y.o. female with a hx of recent coronary stenting who is being seen today for the evaluation of medication management at the request of Dr.Long.  History of Present Illness:   Rachel Clark has a history of coronary artery disease with recent catheterization and PCI of the ramus intermediate branch as illustrated below.  She was just discharged from the hospital last week.  She presents with coffee-ground emesis.  Patient presented with chest pain with her recent admission.  She had a mildly elevated troponin.  She underwent DES stenting x3 with overlapping stents.  She was sent home with Effient and aspirin.  Brilinta was not used because of interaction with Tegretol.  She previously had a rash with Plavix.  She also has a history of orthostatic hypotension.  The history is mainly given by her husband because she has a short term memory problem.  She says she was feeling fine after discharge from the hospital.  She has orthostatic hypotension but was able to cook dinner yesteday.  She does not remember any symptoms prior to the event today and feels fine now.  She called her husband into the bathroom this morning.  She had had vomiting which EMS described as coffee ground.  She through up several times. Otherwise denies any symptoms.  The patient denies any new symptoms such as chest discomfort, neck or arm discomfort. There has been no new shortness of breath, PND or orthopnea. There have been no reported palpitations, presyncope or syncope.    Past Medical History:  Diagnosis Date   Arthritis    "fingers" (06/29/2016)   Chronic lower back pain    Colon cancer ([place]) 12/04/2011   s/p  Laparoscopic-assisted transverse colectomy on 12/19/2011 by Dr. Donne Hazel.  pT3 N0 M0.    Coronary artery disease    a. remote NSTEMI with PCI to the RCA; b. S/P CABG in 2007; c. 2010 - 2 v CAD with L-LAD and S-RCA patent, small caliber Dx 70% (treated medically - no amenable to PCI), CFX free of significant disease, normal LVF. d. 10/2014 Lexi MV: EF 61%, no ischemia/infarct.   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, controlled ([place]) 12/06/2011   last seizure was in 1972 ;takes Tegretol (06/29/2016)    Headache    "weekly" (06/29/2016)   Hyperlipidemia    Hypothyroid    Iron deficiency anemia 11/17/2011   Memory change 11/13/2016   Monoclonal gammopathy of unknown significance    Myocardial infarction [facility]) 2007   Orthostatic hypotension 12/21/2020   Presence of permanent cardiac pacemaker    Syncope and collapse    pacemaker implanted   Unstable angina [facility])     Past Surgical History:  Procedure Laterality Date   ANTERIOR CERVICAL DECOMP/DISCECTOMY FUSION  1980's   BACK SURGERY     CARDIAC CATHETERIZATION  05/20/2009   obstructive native vessel disease in LAD, RCA, and first diagonal, patent vein graft to distal RCA and LIMA to LAD,normal. ef 60%   CARDIAC CATHETERIZATION N/A 03/24/2015   Procedure: Left Heart Cath and Cors/Grafts Angiography;  Surgeon: Wellington Hampshire, MD;  Location: [place] CV LAB;  Service: Cardiovascular;  Laterality: N/A;   CARDIAC CATHETERIZATION N/A 07/28/2015   Procedure: Left Heart Cath and Coronary Angiography;  Surgeon: Troy Sine, MD;  Location: [place] CV [facility];  Service: Cardiovascular;  Laterality: N/A;   CARDIAC CATHETERIZATION N/A 11/09/2015   Procedure: Coronary Stent Intervention;  Surgeon: Sherren Mocha, MD;  Location: [place] [facility];  Service: Cardiovascular;  Laterality: N/A;   CARDIAC CATHETERIZATION N/A 11/09/2015   Procedure: Left Heart Cath and  Coronary Angiography;  Surgeon: Sherren Mocha, MD;  Location: [place] CV LAB;  Service: Cardiovascular;  Laterality: N/A;   CARDIAC CATHETERIZATION N/A 06/29/2016   Procedure: Left Heart Cath and Coronary Angiography;  Surgeon: Nelva Bush, MD;  Location: [place] CV LAB;  Service: Cardiovascular;  Laterality: N/A;   CARDIAC CATHETERIZATION N/A 06/29/2016   Procedure: Intravascular Pressure Wire/FFR Study;  Surgeon: Nelva Bush, MD;  Location: [place] CV [facility];  Service: Cardiovascular;  Laterality: N/A;   CARDIAC CATHETERIZATION N/A 06/29/2016   Procedure: Coronary Balloon Angioplasty;  Surgeon: Nelva Bush, MD;  Location: [place] CV LAB;  Service: Cardiovascular;  Laterality: N/A;   COLON RESECTION  12/19/2011   Procedure: COLON RESECTION LAPAROSCOPIC;  Surgeon: Rolm Bookbinder, MD;  Location: [place];  Service: General;  Laterality: N/A;  laparoscopic hand assisted partial colon resection   COLON SURGERY     COLONOSCOPY     CORONARY ANGIOPLASTY WITH STENT PLACEMENT  ~ 2007   1 stent   CORONARY ARTERY BYPASS GRAFT  2007   "CABG X2"   DILATION AND CURETTAGE OF UTERUS  1973   EP IMPLANTABLE DEVICE N/A 03/25/2015   MDT Advisa DR pacemaker implanted by Dr Rayann Heman for transient complete heart block and syncope   INSERT / REPLACE / REMOVE PACEMAKER     LEFT HEART CATH AND CORS/GRAFTS ANGIOGRAPHY N/A 02/21/2018   Procedure: LEFT HEART CATH AND CORS/GRAFTS ANGIOGRAPHY;  Surgeon: Lorretta Harp, MD;  Location: [place] [facility];  Service: Cardiovascular;  Laterality: N/A;   LEFT HEART CATH AND CORS/GRAFTS ANGIOGRAPHY N/A 03/08/2021   Procedure: LEFT HEART CATH AND CORS/GRAFTS ANGIOGRAPHY;  Surgeon: [name], Peter M, MD;  Location: [place] [facility];  Service: Cardiovascular;  Laterality: N/A;   PORT-A-CATH REMOVAL N/A 06/12/2013   Procedure: REMOVAL PORT-A-CATH;  Surgeon: Rolm Bookbinder, MD;  Location: [place];  Service: General;  Laterality: N/A;    PORTACATH PLACEMENT  06/04/2012   Procedure: INSERTION PORT-A-CATH;  Surgeon: Rolm Bookbinder, MD;  Location: [place];  Service: General;  Laterality: N/A;  Insertion of port-a-cath    POSTERIOR LAMINECTOMY / [place] Medications:  Prior to Admission medications   Medication Sig Start Date End Date Taking? Authorizing Provider  acetaminophen (TYLENOL) 500 MG tablet Take 500-1,000 mg by mouth daily as needed for mild pain or headache.    [provider]  aspirin 81 MG chewable tablet Chew 1 tablet (81 mg total) by mouth daily. 06/30/16   Arbutus Leas, NP  Azelastine HCl 0.15 % SOLN Place 1 spray into both nostrils daily as needed (seasonal allergies).  12/22/14   [provider]  Bempedoic Acid-Ezetimibe (NEXLIZET) 180-10 MG TABS Take 180 mg by mouth daily. 12/29/20   [name], Peter M, MD  carbamazepine (TEGRETOL XR) 200 MG 12 hr tablet Take 3 tablets (600 mg total) by mouth 2 (two) times daily. 01/19/21   Margette Fast  K, MD  carvedilol (COREG) 3.125 MG tablet Take 1 tablet (3.125 mg total) by mouth 2 (two) times daily. 09/10/20 09/05/21  [name], Peter M, MD  DULoxetine (CYMBALTA) 60 MG capsule Take 60 mg by mouth daily.     [provider]  ethosuximide (ZARONTIN) 250 MG capsule TAKE 2 CAPSULES IN THE MORNING, TAKE 2 CAPSULES AT NIGHT 01/19/21   Suzzanne Cloud, NP  levothyroxine (SYNTHROID, LEVOTHROID) 50 MCG tablet Take 50 mcg by mouth daily.    [provider]  memantine (NAMENDA) 10 MG tablet Take 1 tablet (10 mg total) by mouth 2 (two) times daily. 08/17/20   Suzzanne Cloud, NP  nitroGLYCERIN (NITROSTAT) 0.4 MG SL tablet Place 1 tablet (0.4 mg total) under the tongue every 5 (five) minutes as needed. For chest pain. 09/10/20   [name], Peter M, MD  prasugrel (EFFIENT) 10 MG TABS tablet Take 1 tablet (10 mg total) by mouth daily. 03/10/21   Jerline Pain, MD  tiZANidine (ZANAFLEX) 2 MG tablet Take by mouth  every 6 (six) hours as needed for muscle spasms.    [provider]    Inpatient Medications: Scheduled Meds:  Continuous Infusions:  PRN Meds:   Allergies:    Allergies  Allergen Reactions   Aspirin Other (See Comments)    GI upset- can tolerate 81 mg ASA, just not full doses   Capecitabine     Other reaction(s): diarrhea   Crestor [Rosuvastatin]     myalgias   Erythromycin Nausea Only    Gi upset   Fluticasone     Other reaction(s): sore throat   Lipitor [Atorvastatin]     myalgias   Lisinopril Cough   Niacin And Related Other (See Comments)    Pt does not recall reactions    Statins Other (See Comments)    Muscle pain   Sulfa Drugs Cross Reactors Swelling   Sulfamethoxazole     Other reaction(s): anaphylaxis   Tetracyclines & Related Nausea Only   Penicillins Nausea Only and Rash    Has patient had a PCN reaction causing immediate rash, facial/tongue/throat swelling, SOB or lightheadedness with hypotension: Yes Has patient had a PCN reaction causing severe rash involving mucus membranes or skin necrosis: No Has patient had a PCN reaction that required hospitalization No Has patient had a PCN reaction occurring within the last 10 years: No If all of the above answers are "NO", then may proceed with Cephalosporin use.    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   Patient is right handed.   Patient has a high school education with some college education.   Patient drinks 2 cups daily.   Social Determinants of Health   Financial Resource Strain: Not on file  Food Insecurity: Not on file  Transportation Needs: Not on file  Physical Activity: Not on file  Stress: Not on file  Social Connections: Not on file  Intimate Partner Violence: Not on file    Family History:    Family History  Problem Relation Age of Onset   Heart attack Father    Heart disease Father    Heart attack Brother 7   Cancer Mother        lung   Stroke Neg Hx      ROS:  Please see the history of present illness.  ROS  All other ROS reviewed and negative.     Physical Exam/Data:   Vitals:   03/13/21 1045 03/13/21 1100 03/13/21 1115 03/13/21 1245  BP: 118/75 132/73 120/77 110/61  Pulse: 92 90 89 (!) 106  Resp: '16 18 19 17  '$ Temp:      SpO2: 99% 100% 100% 99%    Intake/Output Summary (Last 24 hours) at 03/13/2021 1347 Last data filed at 03/13/2021 1057 Gross per 24 hour  Intake 1000 ml  Output --  Net 1000 ml   There were no vitals filed for this visit. There is no height or weight on file to calculate BMI.  GENERAL:  Wellappearing HEENT:   Pupils equal round and reactive, fundi not visualized, oral mucosa unremarkable NECK:  No  jugular venous distention, waveform within normal limits, carotid upstroke brisk and symmetric, no bruits, no thyromegaly LYMPHATICS:  No cervical, inguinal adenopathy LUNGS:   Clear to auscultation bilaterally BACK:  No CVA tenderness CHEST:   Well healed pacemaker pocket.  HEART:  PMI not displaced or sustained,S1 and S2 within normal limits, no S3,  no S4, no clicks, no rubs, no murmurs ABD:  Flat, positive bowel sounds normal in frequency in pitch, no bruits, no rebound, no guarding, no midline pulsatile mass, no hepatomegaly, no splenomegaly EXT:  2 plus pulses throughout, no edema, no cyanosis no clubbing SKIN:  No rashes no nodules NEURO:   Cranial nerves II through XII grossly intact, motor grossly intact throughout PSYCH:    Cognitively intact, oriented to person place and time.  Short term memory deficit is obvious   EKG:  The EKG was personally reviewed and demonstrates:  sinus tach rate 108, axis WNL, non specific ST T wave changes unchanged from 9/7 EKG.     Cardiac cath:    Diagnostic Dominance: Right Intervention   _____________  Laboratory Data:  Chemistry Recent Labs  Lab 03/08/21 1155 03/08/21 1655 03/09/21 0218 03/13/21 0927 03/13/21 1013  NA 130*   < > 132* 134* 135  K 4.0   < > 3.9 4.9 4.8  CL 98  --  99 103 103  CO2 25  --  26 23  --   GLUCOSE 122*  --  102* 127* 122*  BUN 14  --  12 39* 42*  CREATININE 0.71  --  0.69 0.68 0.60  CALCIUM 8.6*  --  8.7* 8.4*  --   GFRNONAA >60  --  >60 >60  --   ANIONGAP 7  --  7 8  --    < > =  values in this interval not displayed.    Recent Labs  Lab 03/13/21 0927  PROT 6.5  ALBUMIN 3.3*  AST 17  ALT 14  ALKPHOS 43  BILITOT 0.5   Hematology Recent Labs  Lab 03/08/21 1155 03/08/21 1655 03/09/21 0218 03/13/21 0927 03/13/21 1013  WBC 6.6  --  8.5 10.9*  --   RBC 4.28  --  4.01 3.95  --   HGB 13.0   < > 12.1 12.0 11.9*  HCT 38.6   < > 35.4* 35.9* 35.0*  MCV 90.2  --  88.3 90.9  --   MCH 30.4  --  30.2 30.4  --   MCHC 33.7  --  34.2 33.4  --   RDW 12.5  --  12.4 12.6  --   PLT 368  --  319 377  --    < > = values in this interval not displayed.   Cardiac EnzymesNo results for input(s): TROPONINI in the last 168 hours. No results for input(s): TROPIPOC in the last 168 hours.  BNPNo results for input(s): BNP, PROBNP in the last 168 hours.   DDimer No results for input(s): DDIMER in the last 168 hours.  Radiology/Studies:  DG Chest 1 View  Result Date: 03/13/2021 CLINICAL DATA:  Patient complains of brown emesis EXAM: CHEST  1 VIEW COMPARISON:  March 08, 2021, February 23, 2018 FINDINGS: The heart size and mediastinal contours are stable. Cardiac pacemaker is unchanged. Both lungs are clear. Chronic change at left hemidiaphragm is stable compared to 2019. The visualized skeletal structures are unremarkable. IMPRESSION: No active disease. Electronically Signed   By: Abelardo Diesel M.D.   On: 03/13/2021 10:31    Assessment and Plan:   GI BLEED: Certainly the patient is at higher risk for stent thrombosis and myocardial infarction with discontinuation of dual antiplatelet therapy particularly in the first month to 6 weeks following intervention and with overlapping stents in a smaller vessel.  Ideally she would be on DAPT for 1 year uninterrupted.  However, if GI thinks that the risk of ongoing bleeding or severe bleeding is high and or a procedure is planned she could hold the Effient resuming as soon as she is cleared by GI.   Aspirin should be continued unless it is  thought to be exceptionally prohibitive risk.  There is controversial role for bridging with IIb IIIa inhibitors and this is not standard using these agents would increase bleeding risk.  There is no role for therapeutic heparin in this situation.  Continue DAPT only if cleared by GI to do so.    I have had the conversation with the patient and her husband and I will defer to GI.  If they plan to hold the Effient I would suggest that the patient be followed as an in patient while she completes the GI work up.      For questions or updates, please contact [place] Please consult [email or web address] for contact info under Cardiology/STEMI.   Signed, Minus Breeding, MD  03/13/2021 1:47 PM

## 2021-03-13 NOTE — H&P (Signed)
History and Physical    Rachel Clark [id number] DOB: 03/07/1951 DOA: 03/13/2021  PCP: Harlan Stains, MD (Confirm with patient/family/NH records and if not entered, this has to be entered at [facility] point of entry) Patient coming from: Home  I have personally briefly reviewed patient's old medical records in [place]  Chief Complaint: Coffee ground like vomiting  HPI: Rachel Clark is a 70 y.o. female with medical history significant of CAD with recent NSTEMI and stenting x3 on ASA and Effient duo-antiplatelet, HTN, HLD, seizure disorder, memory loss, colon CA s/p resection, came with new onset of coffee ground material vomiting x3 today.  No abd pain, patient woke up this morning, felt nausea and went to bathroom, suddenly vomited large amount of coffee ground like material on the wall then into the toilet bowel, she then had a similar episode right before EMS came and 3rd episode in the ED, when vomitus filled up about half way the blue bag. No abd pain, no black/darker stool noticed and she does not take any NSAIDS other than the ASA. She denied any chest pain, light headedness. No more vomiting since around 9 AM this morning.  ED Course: BP borderline, responded to 1 liter of IV bolus. Hb 12.0 compared to 12.1 4 days ago. Cardio and [facility] consulted. One dose of IV PPI given.  Review of Systems: As per HPI otherwise 14 point review of systems negative.   Past Medical History:  Diagnosis Date   Arthritis    "fingers" (06/29/2016)   Chronic lower back pain    Colon cancer ([place]) 12/04/2011   s/p Laparoscopic-assisted transverse colectomy on 12/19/2011 by Dr. Donne Hazel.  pT3 N0 M0.    Coronary artery disease    a. remote NSTEMI with PCI to the RCA; b. S/P CABG in 2007; c. 2010 - 2 v CAD with L-LAD and S-RCA patent, small caliber Dx 70% (treated medically - no amenable to PCI), CFX free of significant disease, normal LVF. d. 10/2014 Lexi MV: EF 61%, no ischemia/infarct.    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, controlled ([place]) 12/06/2011   last seizure was in 1972 ;takes Tegretol (06/29/2016)    Headache    "weekly" (06/29/2016)   Hyperlipidemia    Hypothyroid    Iron deficiency anemia 11/17/2011   Memory change 11/13/2016   Monoclonal gammopathy of unknown significance    Orthostatic hypotension 12/21/2020   Presence of permanent cardiac pacemaker    Syncope and collapse    pacemaker implanted    Past Surgical History:  Procedure Laterality Date   ANTERIOR CERVICAL DECOMP/DISCECTOMY FUSION  1980's   BACK SURGERY     CARDIAC CATHETERIZATION  05/20/2009   obstructive native vessel disease in LAD, RCA, and first diagonal, patent vein graft to distal RCA and LIMA to LAD,normal. ef 60%   CARDIAC CATHETERIZATION N/A 03/24/2015   Procedure: Left Heart Cath and Cors/Grafts Angiography;  Surgeon: Wellington Hampshire, MD;  Location: [place] [facility];  Service: Cardiovascular;  Laterality: N/A;   CARDIAC CATHETERIZATION N/A 07/28/2015   Procedure: Left Heart Cath and Coronary Angiography;  Surgeon: Troy Sine, MD;  Location: [place] [facility];  Service: Cardiovascular;  Laterality: N/A;   CARDIAC CATHETERIZATION N/A 11/09/2015   Procedure: Coronary Stent Intervention;  Surgeon: Sherren Mocha, MD;  Location: [place] [facility];  Service: Cardiovascular;  Laterality:  N/A;   CARDIAC CATHETERIZATION N/A 11/09/2015   Procedure: Left Heart Cath and Coronary Angiography;  Surgeon: Sherren Mocha, MD;  Location: [place] [facility];  Service: Cardiovascular;  Laterality: N/A;   CARDIAC CATHETERIZATION N/A 06/29/2016   Procedure: Left Heart Cath and Coronary Angiography;  Surgeon: Nelva Bush, MD;  Location: [place] [facility];  Service: Cardiovascular;  Laterality: N/A;   CARDIAC CATHETERIZATION N/A 06/29/2016   Procedure: Intravascular Pressure Wire/FFR Study;  Surgeon:  Nelva Bush, MD;  Location: [place] [facility];  Service: Cardiovascular;  Laterality: N/A;   CARDIAC CATHETERIZATION N/A 06/29/2016   Procedure: Coronary Balloon Angioplasty;  Surgeon: Nelva Bush, MD;  Location: [place] [facility];  Service: Cardiovascular;  Laterality: N/A;   COLON RESECTION  12/19/2011   Procedure: COLON RESECTION LAPAROSCOPIC;  Surgeon: Rolm Bookbinder, MD;  Location: [place];  Service: General;  Laterality: N/A;  laparoscopic hand assisted partial colon resection   COLON SURGERY     COLONOSCOPY     CORONARY ANGIOPLASTY WITH STENT PLACEMENT  ~ 2007   1 stent   CORONARY ARTERY BYPASS GRAFT  2007   "CABG X2"   DILATION AND CURETTAGE OF UTERUS  1973   EP IMPLANTABLE DEVICE N/A 03/25/2015   MDT Advisa DR pacemaker implanted by Dr Rayann Heman for transient complete heart block and syncope   INSERT / REPLACE / REMOVE PACEMAKER     LEFT HEART CATH AND CORS/GRAFTS ANGIOGRAPHY N/A 02/21/2018   Procedure: LEFT HEART CATH AND CORS/GRAFTS ANGIOGRAPHY;  Surgeon: Lorretta Harp, MD;  Location: [place] [facility];  Service: Cardiovascular;  Laterality: N/A;   LEFT HEART CATH AND CORS/GRAFTS ANGIOGRAPHY N/A 03/08/2021   Procedure: LEFT HEART CATH AND CORS/GRAFTS ANGIOGRAPHY;  Surgeon: [name], Peter M, MD;  Location: [place] [facility];  Service: Cardiovascular;  Laterality: N/A;   PORT-A-CATH REMOVAL N/A 06/12/2013   Procedure: REMOVAL PORT-A-CATH;  Surgeon: Rolm Bookbinder, MD;  Location: [place];  Service: General;  Laterality: N/A;   PORTACATH PLACEMENT  06/04/2012   Procedure: INSERTION PORT-A-CATH;  Surgeon: Rolm Bookbinder, MD;  Location: [place];  Service: General;  Laterality: N/A;  Insertion of port-a-cath    POSTERIOR LAMINECTOMY / [place]       reports that she quit smoking about 39 years ago. Her smoking use included cigarettes. She has a 6.00 pack-year smoking history. She has never used smokeless  tobacco. She reports current alcohol use of about 1.0 standard drink per week. She reports that she does not use drugs.  Allergies  Allergen Reactions   Aspirin Other (See Comments)    GI upset- can tolerate 81 mg ASA, just not full doses   Capecitabine     Other reaction(s): diarrhea   Crestor [Rosuvastatin]     myalgias   Erythromycin Nausea Only    Gi upset   Fluticasone     Other reaction(s): sore throat   Lipitor [Atorvastatin]     myalgias   Lisinopril Cough   Niacin And Related Other (See Comments)    Pt does not recall reactions    Statins Other (See Comments)    Muscle pain   Sulfa Drugs Cross Reactors Swelling   Sulfamethoxazole     Other reaction(s): anaphylaxis   Tetracyclines & Related Nausea Only   Penicillins Nausea Only and Rash    Has patient had a PCN reaction causing immediate rash, facial/tongue/throat swelling, SOB or lightheadedness with hypotension: Yes Has patient had  a PCN reaction causing severe rash involving mucus membranes or skin necrosis: No Has patient had a PCN reaction that required hospitalization No Has patient had a PCN reaction occurring within the last 10 years: No If all of the above answers are "NO", then may proceed with Cephalosporin use.    Plavix [Clopidogrel Bisulfate] Rash    Family History  Problem Relation Age of Onset   Heart attack Father    Heart disease Father    Heart attack Brother 47   Cancer Mother        lung   Stroke Neg Hx      Prior to Admission medications   Medication Sig Start Date End Date Taking? Authorizing Provider  acetaminophen (TYLENOL) 500 MG tablet Take 500-1,000 mg by mouth daily as needed for mild pain or headache.    [provider]  aspirin 81 MG chewable tablet Chew 1 tablet (81 mg total) by mouth daily. 06/30/16   Arbutus Leas, NP  Azelastine HCl 0.15 % SOLN Place 1 spray into both nostrils daily as needed (seasonal allergies).  12/22/14   [provider]  Bempedoic  Acid-Ezetimibe (NEXLIZET) 180-10 MG TABS Take 180 mg by mouth daily. 12/29/20   [name], Peter M, MD  carbamazepine (TEGRETOL XR) 200 MG 12 hr tablet Take 3 tablets (600 mg total) by mouth 2 (two) times daily. 01/19/21   Kathrynn Ducking, MD  carvedilol (COREG) 3.125 MG tablet Take 1 tablet (3.125 mg total) by mouth 2 (two) times daily. 09/10/20 09/05/21  [name], Peter M, MD  DULoxetine (CYMBALTA) 60 MG capsule Take 60 mg by mouth daily.     [provider]  ethosuximide (ZARONTIN) 250 MG capsule TAKE 2 CAPSULES IN THE MORNING, TAKE 2 CAPSULES AT NIGHT 01/19/21   Suzzanne Cloud, NP  levothyroxine (SYNTHROID, LEVOTHROID) 50 MCG tablet Take 50 mcg by mouth daily.    [provider]  memantine (NAMENDA) 10 MG tablet Take 1 tablet (10 mg total) by mouth 2 (two) times daily. 08/17/20   Suzzanne Cloud, NP  nitroGLYCERIN (NITROSTAT) 0.4 MG SL tablet Place 1 tablet (0.4 mg total) under the tongue every 5 (five) minutes as needed. For chest pain. 09/10/20   [name], Peter M, MD  prasugrel (EFFIENT) 10 MG TABS tablet Take 1 tablet (10 mg total) by mouth daily. 03/10/21   Jerline Pain, MD  tiZANidine (ZANAFLEX) 2 MG tablet Take by mouth every 6 (six) hours as needed for muscle spasms.    [provider]    Physical Exam: Vitals:   03/13/21 1100 03/13/21 1115 03/13/21 1245 03/13/21 1350  BP: 132/73 120/77 110/61 127/72  Pulse: 90 89 (!) 106 98  Resp: '18 19 17 '$ (!) 22  Temp:      SpO2: 100% 100% 99% 98%    Constitutional: NAD, calm, comfortable Vitals:   03/13/21 1100 03/13/21 1115 03/13/21 1245 03/13/21 1350  BP: 132/73 120/77 110/61 127/72  Pulse: 90 89 (!) 106 98  Resp: '18 19 17 '$ (!) 22  Temp:      SpO2: 100% 100% 99% 98%   Eyes: PERRL, lids and conjunctivae normal ENMT: Mucous membranes are moist. Posterior pharynx clear of any exudate or lesions.Normal dentition.  Neck: normal, supple, no masses, no thyromegaly Respiratory: clear to auscultation bilaterally, no wheezing, no  crackles. Normal respiratory effort. No accessory muscle use.  Cardiovascular: Regular rate and rhythm, no murmurs / rubs / gallops. No extremity edema. 2+ pedal pulses. No carotid bruits.  Abdomen: no tenderness, no masses palpated. No hepatosplenomegaly. Bowel sounds positive.  Musculoskeletal: no clubbing / cyanosis. No joint deformity upper and lower extremities. Good ROM, no contractures. Normal muscle tone.  Skin: no rashes, lesions, ulcers. No induration Neurologic: CN 2-12 grossly intact. Sensation intact, DTR normal. Strength 5/5 in all 4.  Psychiatric: Normal judgment and insight. Alert and oriented x 3. Normal mood.     Labs on Admission: I have personally reviewed following labs and imaging studies  CBC: Recent Labs  Lab 03/08/21 1155 03/08/21 1655 03/09/21 0218 03/13/21 0927 03/13/21 1013  WBC 6.6  --  8.5 10.9*  --   NEUTROABS  --   --   --  8.5*  --   HGB 13.0 13.6 12.1 12.0 11.9*  HCT 38.6 40.0 35.4* 35.9* 35.0*  MCV 90.2  --  88.3 90.9  --   PLT 368  --  319 377  --    Basic Metabolic Panel: Recent Labs  Lab 03/08/21 1155 03/08/21 1655 03/09/21 0218 03/13/21 0927 03/13/21 1013  NA 130* 105* 132* 134* 135  K 4.0 3.0* 3.9 4.9 4.8  CL 98  --  99 103 103  CO2 25  --  26 23  --   GLUCOSE 122*  --  102* 127* 122*  BUN 14  --  12 39* 42*  CREATININE 0.71  --  0.69 0.68 0.60  CALCIUM 8.6*  --  8.7* 8.4*  --    GFR: Estimated Creatinine Clearance: 67.4 mL/min (by C-G formula based on SCr of 0.6 mg/dL). Liver Function Tests: Recent Labs  Lab 03/13/21 0927  AST 17  ALT 14  ALKPHOS 43  BILITOT 0.5  PROT 6.5  ALBUMIN 3.3*   Recent Labs  Lab 03/13/21 0927  LIPASE 22   No results for input(s): AMMONIA in the last 168 hours. Coagulation Profile: Recent Labs  Lab 03/13/21 0927  INR 1.1   Cardiac Enzymes: No results for input(s): CKTOTAL, CKMB, CKMBINDEX, TROPONINI in the last 168 hours. BNP (last 3 results) No results for input(s): PROBNP in the  last 8760 hours. HbA1C: No results for input(s): HGBA1C in the last 72 hours. CBG: No results for input(s): GLUCAP in the last 168 hours. Lipid Profile: No results for input(s): CHOL, HDL, LDLCALC, TRIG, CHOLHDL, LDLDIRECT in the last 72 hours. Thyroid Function Tests: No results for input(s): TSH, T4TOTAL, FREET4, T3FREE, THYROIDAB in the last 72 hours. Anemia Panel: No results for input(s): VITAMINB12, FOLATE, FERRITIN, TIBC, IRON, RETICCTPCT in the last 72 hours. Urine analysis:    Component Value Date/Time   COLORURINE AMBER (A) 05/07/2012 1325   APPEARANCEUR CLOUDY (A) 05/07/2012 1325   LABSPEC 1.020 06/12/2014 1436   PHURINE 6.0 06/12/2014 1436   PHURINE 6.0 05/07/2012 1325   GLUCOSEU Negative 06/12/2014 1436   HGBUR Negative 06/12/2014 1436   HGBUR NEGATIVE 05/07/2012 1325   BILIRUBINUR Negative 06/12/2014 1436   KETONESUR Negative 06/12/2014 1436   KETONESUR NEGATIVE 05/07/2012 1325   PROTEINUR Negative 06/12/2014 1436   PROTEINUR 30 (A) 05/07/2012 1325   UROBILINOGEN 0.2 06/12/2014 1436   NITRITE Negative 06/12/2014 1436   NITRITE NEGATIVE 05/07/2012 1325   LEUKOCYTESUR Negative 06/12/2014 1436    Radiological Exams on Admission: DG Chest 1 View  Result Date: 03/13/2021 CLINICAL DATA:  Patient complains of brown emesis EXAM: CHEST  1 VIEW COMPARISON:  March 08, 2021, February 23, 2018 FINDINGS: The heart size and mediastinal contours are stable. Cardiac pacemaker is unchanged. Both lungs are clear.  Chronic change at left hemidiaphragm is stable compared to 2019. The visualized skeletal structures are unremarkable. IMPRESSION: No active disease. Electronically Signed   By: Abelardo Diesel M.D.   On: 03/13/2021 10:31    EKG: Independently reviewed. Sinus tachy, no ST-T changes.  Assessment/Plan Active Problems:   Acute upper GI bleed   GI bleed  (please populate well all problems here in Problem List. (For example, if patient is on BP meds at home and you resume or  decide to hold them, it is a problem that needs to be her. Same for CAD, COPD, HLD and so on)  Upper GI bleeding with hematemesis -GI consulted, recommend, NPO, PPI BID and series of Hb. Given there is no significant drop of Hb or BP/HR changes, GI agreed with continue duo-antiplatelet therapy. Plans for monitor H/H for 24 hours, then slowly resume diet and possible outpatient PPI. -H/H Q6H, transfuse if patient develop any S/S of hemodynamic instability, then inpatient EGD.  CAD with recent NSTEMI and stenting -Continue ASA and Effient as discussed above.  HTN -Hold Coreg for now -PRN Hydralazine for now.  Seizure disorder -Continue Tegretol  HLD -Statin  Memory loss/Colon CA in remission/Hypothyroid -No acute issue.  DVT prophylaxis: SCD Code Status: Full Code Family Communication: Husband at bedside Disposition Plan: Expect more 2 midnight hospital stay, NPO for 24 hours then slowly resuming diet and another 18-24 hours to make sure active bleeding stops. May need GI intervention and PRBC transfusion. Consults called: [facility] and cardiology Admission status: PCU.   Lequita Halt MD [facility] Pager [phone]  03/13/2021, 3:05 PM

## 2021-03-13 NOTE — ED Notes (Signed)
Husband called and updated as to patient's status.

## 2021-03-13 NOTE — ED Provider Notes (Signed)
[facility] EMERGENCY DEPARTMENT Provider Note   CSN: [id number] Arrival date & time: 03/13/21  [id number]     History No chief complaint on file.   Rachel Clark is a 70 y.o. female who presents via EMS this morning for multiple episodes of coffee-ground emesis at home, found to be hypotensive with systolic in the [id number] by EMS with heart rate in the 110s to 120s.  No history of the same.  Level 5 caveat due to acuity patient's condition upon presentation.  Patient wasseen in triage by this provider and transported directly to room and in the emergency department where I continued her care.  I personally reviewed medical records.  She is history of coronary artery disease with pacemaker, anticoagulated with prasugrel and aspirin. Hypertension, epilepsy on Tegretol and Zarontin to control her seizures.   Also has hx of MGUS, GERD.  Patient admitted to [facility] last week for NSTEMI and underwent catheterization which revealed severe 3 vessel obstructive CAD and had 3 Drug eluting stents placed. Started on prasugrel on 03/09/2021. Upon chart review, patient was previously on prasugrel, discontinued in 2016, though unclear why.  History of CABG in 2007, catheterization 2017. Additionally, patient had laparoscopic-assisted transverse colectomy in June 2017 by Dr. Donne Hazel for colon cancer, T3 N0 M0.  Subsequently underwent chemotherapy and has been in remission since that time.  HPI     Past Medical History:  Diagnosis Date   Arthritis    "fingers" (06/29/2016)   Chronic lower back pain    Colon cancer ([place]) 12/04/2011   s/p Laparoscopic-assisted transverse colectomy on 12/19/2011 by Dr. Donne Hazel.  pT3 N0 M0.    Coronary artery disease    a. remote NSTEMI with PCI to the RCA; b. S/P CABG in 2007; c. 2010 - 2 v CAD with L-LAD and S-RCA patent, small caliber Dx 70% (treated medically - no amenable to PCI), CFX free of significant disease, normal LVF. d. 10/2014 Lexi MV: EF 61%, no  ischemia/infarct.   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, controlled ([place]) 12/06/2011   last seizure was in 1972 ;takes Tegretol (06/29/2016)    Headache    "weekly" (06/29/2016)   Hyperlipidemia    Hypothyroid    Iron deficiency anemia 11/17/2011   Memory change 11/13/2016   Monoclonal gammopathy of unknown significance    Orthostatic hypotension 12/21/2020   Presence of permanent cardiac pacemaker    Syncope and collapse    pacemaker implanted    Patient Active Problem List   Diagnosis Date Noted   Acute upper GI bleed 03/13/2021   GI bleed 03/13/2021   Preventive measure 01/14/2021   Orthostatic hypotension 12/21/2020   Memory change 11/13/2016   Acute bronchitis 03/23/2016   Chest pain with high risk for cardiac etiology 11/05/2015   Essential hypertension 08/25/2015   Sick sinus syndrome ([place]) 08/16/2015   Syncope and collapse 08/10/2015   CAD, multiple vessel    Chest pain    Cardiac pacemaker in situ 07/25/2015   Angina pectoris ([place]) 03/24/2015   Unstable angina ([place]) 10/26/2014   Seizures ([place]) 02/19/2014   Lung nodule 02/06/2013   Thyroid nodule 02/06/2013   History of colon cancer 12/04/2011   GERD (gastroesophageal reflux disease) 08/17/2011   Monoclonal gammopathy of unknown significance    Hypothyroid    Coronary artery disease, post CABG 2007     Dyslipidemia  Past Surgical History:  Procedure Laterality Date   ANTERIOR CERVICAL DECOMP/DISCECTOMY FUSION  1980's   BACK SURGERY     CARDIAC CATHETERIZATION  05/20/2009   obstructive native vessel disease in LAD, RCA, and first diagonal, patent vein graft to distal RCA and LIMA to LAD,normal. ef 60%   CARDIAC CATHETERIZATION N/A 03/24/2015   Procedure: Left Heart Cath and Cors/Grafts Angiography;  Surgeon: Wellington Hampshire, MD;  Location: [place] [facility];  Service: Cardiovascular;  Laterality: N/A;   CARDIAC  CATHETERIZATION N/A 07/28/2015   Procedure: Left Heart Cath and Coronary Angiography;  Surgeon: Troy Sine, MD;  Location: [place] [facility];  Service: Cardiovascular;  Laterality: N/A;   CARDIAC CATHETERIZATION N/A 11/09/2015   Procedure: Coronary Stent Intervention;  Surgeon: Sherren Mocha, MD;  Location: [place] [facility];  Service: Cardiovascular;  Laterality: N/A;   CARDIAC CATHETERIZATION N/A 11/09/2015   Procedure: Left Heart Cath and Coronary Angiography;  Surgeon: Sherren Mocha, MD;  Location: [place] [facility];  Service: Cardiovascular;  Laterality: N/A;   CARDIAC CATHETERIZATION N/A 06/29/2016   Procedure: Left Heart Cath and Coronary Angiography;  Surgeon: Nelva Bush, MD;  Location: [place] [facility];  Service: Cardiovascular;  Laterality: N/A;   CARDIAC CATHETERIZATION N/A 06/29/2016   Procedure: Intravascular Pressure Wire/FFR Study;  Surgeon: Nelva Bush, MD;  Location: [place] [facility];  Service: Cardiovascular;  Laterality: N/A;   CARDIAC CATHETERIZATION N/A 06/29/2016   Procedure: Coronary Balloon Angioplasty;  Surgeon: Nelva Bush, MD;  Location: [place] [facility];  Service: Cardiovascular;  Laterality: N/A;   COLON RESECTION  12/19/2011   Procedure: COLON RESECTION LAPAROSCOPIC;  Surgeon: Rolm Bookbinder, MD;  Location: [place];  Service: General;  Laterality: N/A;  laparoscopic hand assisted partial colon resection   COLON SURGERY     COLONOSCOPY     CORONARY ANGIOPLASTY WITH STENT PLACEMENT  ~ 2007   1 stent   CORONARY ARTERY BYPASS GRAFT  2007   "CABG X2"   DILATION AND CURETTAGE OF UTERUS  1973   EP IMPLANTABLE DEVICE N/A 03/25/2015   MDT Advisa DR pacemaker implanted by Dr Rayann Heman for transient complete heart block and syncope   INSERT / REPLACE / REMOVE PACEMAKER     LEFT HEART CATH AND CORS/GRAFTS ANGIOGRAPHY N/A 02/21/2018   Procedure: LEFT HEART CATH AND CORS/GRAFTS ANGIOGRAPHY;  Surgeon: Lorretta Harp, MD;  Location: [place] [facility];   Service: Cardiovascular;  Laterality: N/A;   LEFT HEART CATH AND CORS/GRAFTS ANGIOGRAPHY N/A 03/08/2021   Procedure: LEFT HEART CATH AND CORS/GRAFTS ANGIOGRAPHY;  Surgeon: [name], Peter M, MD;  Location: [place] [facility];  Service: Cardiovascular;  Laterality: N/A;   PORT-A-CATH REMOVAL N/A 06/12/2013   Procedure: REMOVAL PORT-A-CATH;  Surgeon: Rolm Bookbinder, MD;  Location: [place];  Service: General;  Laterality: N/A;   PORTACATH PLACEMENT  06/04/2012   Procedure: INSERTION PORT-A-CATH;  Surgeon: Rolm Bookbinder, MD;  Location: [place];  Service: General;  Laterality: N/A;  Insertion of port-a-cath    POSTERIOR LAMINECTOMY / [place]       OB History   No obstetric history on file.     Family History  Problem Relation Age of Onset   Heart attack Father    Heart disease Father    Heart attack Brother 42   Cancer Mother        lung   Stroke Neg Hx     Social History  Tobacco Use   Smoking status: Former    Packs/day: 0.30    Years: 20.00    Pack years: 6.00    Types: Cigarettes    Quit date: 06/06/1981    Years since quitting: 39.7   Smokeless tobacco: Never  Vaping Use   Vaping Use: Never used  Substance Use Topics   Alcohol use: Yes    Alcohol/week: 1.0 standard drink    Types: 1 Glasses of wine per week    Comment: occ   Drug use: No    Home Medications Prior to Admission medications   Medication Sig Start Date End Date Taking? Authorizing Provider  acetaminophen (TYLENOL) 500 MG tablet Take 500-1,000 mg by mouth daily as needed for mild pain or headache.   Yes [provider]  aspirin 81 MG chewable tablet Chew 1 tablet (81 mg total) by mouth daily. 06/30/16  Yes Arbutus Leas, NP  Azelastine HCl 0.15 % SOLN Place 1 spray into both nostrils daily as needed (seasonal allergies).  12/22/14  Yes [provider]  Bempedoic Acid-Ezetimibe (NEXLIZET) 180-10 MG TABS Take 180 mg by  mouth daily. 12/29/20  Yes [name], Peter M, MD  carbamazepine (TEGRETOL XR) 200 MG 12 hr tablet Take 3 tablets (600 mg total) by mouth 2 (two) times daily. 01/19/21  Yes Kathrynn Ducking, MD  carvedilol (COREG) 3.125 MG tablet Take 1 tablet (3.125 mg total) by mouth 2 (two) times daily. 09/10/20 09/05/21 Yes [name], Peter M, MD  DULoxetine (CYMBALTA) 60 MG capsule Take 60 mg by mouth daily.    Yes [provider]  ethosuximide (ZARONTIN) 250 MG capsule TAKE 2 CAPSULES IN THE MORNING, TAKE 2 CAPSULES AT NIGHT 01/19/21  Yes Suzzanne Cloud, NP  levothyroxine (SYNTHROID, LEVOTHROID) 50 MCG tablet Take 50 mcg by mouth daily.   Yes [provider]  memantine (NAMENDA) 10 MG tablet Take 1 tablet (10 mg total) by mouth 2 (two) times daily. 08/17/20  Yes Suzzanne Cloud, NP  nitroGLYCERIN (NITROSTAT) 0.4 MG SL tablet Place 1 tablet (0.4 mg total) under the tongue every 5 (five) minutes as needed. For chest pain. 09/10/20  Yes [name], Peter M, MD  prasugrel (EFFIENT) 10 MG TABS tablet Take 1 tablet (10 mg total) by mouth daily. 03/10/21  Yes Jerline Pain, MD  tiZANidine (ZANAFLEX) 2 MG tablet Take by mouth every 6 (six) hours as needed for muscle spasms.   Yes [provider]    Allergies    Aspirin, Capecitabine, Crestor [rosuvastatin], Erythromycin, Fluticasone, Lipitor [atorvastatin], Lisinopril, Niacin and related, Statins, Sulfa drugs cross reactors, Sulfamethoxazole, Tetracyclines & related, Penicillins, and Plavix [clopidogrel bisulfate]  Review of Systems   Review of Systems  Physical Exam Updated Vital Signs BP 125/72   Pulse 100   Temp 97.8 F (36.6 C)   Resp 20   SpO2 100%   Physical Exam  ED Results / Procedures / Treatments   Labs (all labs ordered are listed, but only abnormal results are displayed) Labs Reviewed  COMPREHENSIVE METABOLIC PANEL - Abnormal; Notable for the following components:      Result Value   Sodium 134 (*)    Glucose, Bld 127 (*)    BUN  39 (*)    Calcium 8.4 (*)    Albumin 3.3 (*)    All other components within normal limits  CBC WITH DIFFERENTIAL/PLATELET - Abnormal; Notable for the following components:   WBC 10.9 (*)    HCT 35.9 (*)  Neutro Abs 8.5 (*)    All other components within normal limits  I-STAT CHEM 8, ED - Abnormal; Notable for the following components:   BUN 42 (*)    Glucose, Bld 122 (*)    Calcium, Ion 1.13 (*)    Hemoglobin 11.9 (*)    HCT 35.0 (*)    All other components within normal limits  POC OCCULT BLOOD, ED - Abnormal; Notable for the following components:   Fecal Occult Bld POSITIVE (*)    All other components within normal limits  PROTIME-INR  LIPASE, BLOOD  LACTIC ACID, PLASMA  HEMOGLOBIN AND HEMATOCRIT, BLOOD  HEMOGLOBIN AND HEMATOCRIT, BLOOD  TYPE AND SCREEN    EKG EKG Interpretation  Date/Time:  Sunday March 13 2021 09:39:10 EDT Ventricular Rate:  108 PR Interval:  201 QRS Duration: 92 QT Interval:  327 QTC Calculation: 439 R Axis:   31 Text Interpretation: Sinus tachycardia Abnormal R-wave progression, early transition Nonspecific T abnormalities, lateral leads Baseline wander TECHNICALLY DIFFICULT likely no significant change since last tracing Confirmed by Deno Etienne [id number]) on 03/13/2021 9:43:18 AM  Radiology DG Chest 1 View  Result Date: 03/13/2021 CLINICAL DATA:  Patient complains of brown emesis EXAM: CHEST  1 VIEW COMPARISON:  March 08, 2021, February 23, 2018 FINDINGS: The heart size and mediastinal contours are stable. Cardiac pacemaker is unchanged. Both lungs are clear. Chronic change at left hemidiaphragm is stable compared to 2019. The visualized skeletal structures are unremarkable. IMPRESSION: No active disease. Electronically Signed   By: Abelardo Diesel M.D.   On: 03/13/2021 10:31    Procedures Procedures   Medications Ordered in ED Medications  0.9 %  sodium chloride infusion (has no administration in time range)  pantoprazole (PROTONIX)  injection 40 mg (has no administration in time range)  acetaminophen (TYLENOL) tablet 500-1,000 mg (has no administration in time range)  aspirin chewable tablet 81 mg (has no administration in time range)  Bempedoic Acid-Ezetimibe 180-10 MG TABS 180 mg (has no administration in time range)  DULoxetine (CYMBALTA) DR capsule 60 mg (has no administration in time range)  memantine (NAMENDA) tablet 10 mg (has no administration in time range)  levothyroxine (SYNTHROID) tablet 50 mcg (has no administration in time range)  prasugrel (EFFIENT) tablet 10 mg (has no administration in time range)  carbamazepine (TEGRETOL XR) 12 hr tablet 600 mg (has no administration in time range)  ethosuximide (ZARONTIN) capsule 500 mg (has no administration in time range)  tiZANidine (ZANAFLEX) tablet 2 mg (has no administration in time range)  Azelastine HCl 0.15 % SOLN 1 spray (has no administration in time range)  ondansetron (ZOFRAN) tablet 4 mg (has no administration in time range)    Or  ondansetron (ZOFRAN) injection 4 mg (has no administration in time range)  hydrALAZINE (APRESOLINE) injection 5 mg (has no administration in time range)  pantoprazole (PROTONIX) injection 40 mg (40 mg Intravenous Given 03/13/21 1022)  lactated ringers bolus 1,000 mL (0 mLs Intravenous Stopped 03/13/21 1057)    ED Course  I have reviewed the triage vital signs and the nursing notes.  Pertinent labs & imaging results that were available during my care of the patient were reviewed by me and considered in my medical decision making (see chart for details).  Clinical Course as of 03/13/21 1556  Sun Mar 13, 2021  1309 Consult call received from [facility] gastroenterologist, Dr. Therisa Doyne, who will not perform an emergent EGD at this time due to concern for esophageal trauma from EGD and  current anticoagulation.  She recommends consult with cardiology regarding the role of holding versus continuing antiplatelet therapy given very recent  cardiac intervention.  Recommends twice daily 40 mg pantoprazole x2 months in the outpatient setting and close outpatient follow-up with both cardiology and gastroenterology.  Appreciate collaboration in the care of this patient. [RS]  [place], Dr. Percival Spanish, who is agreeable to seeing the patient.  He does feel that the patient should remain on the prasugrel to protect her stents and be started on PPIs as requested by the gastroenterologist.  I appreciate his collaboration in the care of this patient. [RS]  1441 Patient has been seen by cardiology, and from a GI perspective may be discharged home, however clinical concern given quantity of coffee-ground emesis, hypotension in the field, and continuation of her antiplatelet therapy.  For this reason feel patient would benefit from observation admission.  Consult to hospitalist, Dr. Roosevelt Locks, who is agreeable to seeing this patient admitting to his service for observation. [RS]    Clinical Course User Index [RS] Tahiry Spicer, Sharlene Dory   MDM Rules/Calculators/A&P                         70 year old female anticoagulated on aspirin and prasugrel who presents with concern for coffee-ground emesis this morning.  Differential diagnosis includes but is not limited to (peptic ulcer disease, esophagitis, gastritis, anticoagulation complication.  Hypertensive on intake with SBP 93, mildly tachycardic to 105.  Vital signs otherwise normal.  Cardiopulmonary exam is reassuring, abdominal exam is benign.  Patient is neurovascular intact in all 4 extremities.  Blood pressure improved markedly after 1 L of LR with systolic in the [id number].  CBC reassuring with hemoglobin of 12, near patient's baseline.  Mild leukocytosis of 10.9.  CMP with very mild hyponatremia 134, elevated BUN to 39 but normal creatinine.  Coag studies are normal.  Patient is fecal occult positive despite nonmelanotic appearing stool on the gloved finger the provider.   .  Consults to GI and cardiology as above.  Consensus is that patient would be best served by staying on prasugrel for preservation of her recent cardiac stenting.  From a gastroenterology standpoint, patient would benefit from treatment with PPI with pantoprazole 40 mg twice daily x2 months.  Given hypotension in the field, quantity of coffee-ground emesis at home, and continuous anticoagulation moving forward, do feel patient would benefit from admission to the hospital for observation-consult to hospitalist as above.  Destiney and her husband voiced understanding of her medical evaluation and treatment plan thus far.  To their questions was answered to their expressed satisfaction.  They are amenable to plan for admission at this time.  This chart was dictated using voice recognition software, Dragon. Despite the best efforts of this provider to proofread and correct errors, errors may still occur which can change documentation meaning.  Final Clinical Impression(s) / ED Diagnoses Final diagnoses:  SOB (shortness of breath)    Rx / DC Orders ED Discharge Orders     None        Aura Dials 03/13/21 1556    Long, Wonda Olds, MD 03/20/21 1759

## 2021-03-13 NOTE — Progress Notes (Signed)
Repeat Hb=10.2, compared to 11.9 this morning, patient has not more hematemesis since this morning. BP has been stable. No change of current management, next repeat H/H 23:00.

## 2021-03-13 NOTE — ED Notes (Signed)
Pt was seem ambulating to the bathroom w/ her spouse, no issues noted at this time.

## 2021-03-14 ENCOUNTER — Inpatient Hospital Stay (HOSPITAL_COMMUNITY): Payer: Medicare Other | Admitting: Anesthesiology

## 2021-03-14 ENCOUNTER — Encounter (HOSPITAL_COMMUNITY)
Admission: EM | Disposition: A | Discharge status: Home / Self Care | Payer: Self-pay | Source: Home / Self Care | Attending: Internal Medicine

## 2021-03-14 ENCOUNTER — Ambulatory Visit (HOSPITAL_COMMUNITY): Admit: 2021-03-14 | Payer: Medicare Other | Admitting: Gastroenterology

## 2021-03-14 ENCOUNTER — Encounter (HOSPITAL_COMMUNITY): Payer: Self-pay | Admitting: Internal Medicine

## 2021-03-14 DIAGNOSIS — K922 Gastrointestinal hemorrhage, unspecified: Secondary | ICD-10-CM | POA: Diagnosis not present

## 2021-03-14 HISTORY — PX: SCLEROTHERAPY: SHX6841

## 2021-03-14 HISTORY — PX: HEMOSTASIS CLIP PLACEMENT: SHX6857

## 2021-03-14 HISTORY — PX: ESOPHAGOGASTRODUODENOSCOPY (EGD) WITH PROPOFOL: SHX5813

## 2021-03-14 LAB — HEMOGLOBIN AND HEMATOCRIT, BLOOD
HCT: 23.3 % — ABNORMAL LOW (ref 36.0–46.0)
HCT: 22.6 % — ABNORMAL LOW (ref 36.0–46.0)
HCT: 24.1 % — ABNORMAL LOW (ref 36.0–46.0)
HCT: 23 % — ABNORMAL LOW (ref 36.0–46.0)
Hemoglobin: 7.8 g/dL — ABNORMAL LOW (ref 12.0–15.0)
Hemoglobin: 7.7 g/dL — ABNORMAL LOW (ref 12.0–15.0)
Hemoglobin: 8 g/dL — ABNORMAL LOW (ref 12.0–15.0)
Hemoglobin: 7.5 g/dL — ABNORMAL LOW (ref 12.0–15.0)

## 2021-03-14 LAB — SARS CORONAVIRUS 2 (TAT 6-24 HRS): SARS Coronavirus 2: NEGATIVE

## 2021-03-14 LAB — PREPARE RBC (CROSSMATCH)

## 2021-03-14 SURGERY — ESOPHAGOGASTRODUODENOSCOPY (EGD) WITH PROPOFOL
Anesthesia: Monitor Anesthesia Care

## 2021-03-14 MED ORDER — DIPHENHYDRAMINE HCL 25 MG PO CAPS
25.0000 mg | ORAL_CAPSULE | Freq: Once | ORAL | Status: AC
  Administered 2021-03-14: 25 mg via ORAL
  Filled 2021-03-14: qty 1

## 2021-03-14 MED ORDER — SODIUM CHLORIDE (PF) 0.9 % IJ SOLN
PREFILLED_SYRINGE | INTRAMUSCULAR | Status: DC | PRN
  Administered 2021-03-14: 6 mL

## 2021-03-14 MED ORDER — PANTOPRAZOLE INFUSION (NEW) - SIMPLE MED
8.0000 mg/h | INTRAVENOUS | Status: AC
  Administered 2021-03-14 – 2021-03-17 (×6): 8 mg/h via INTRAVENOUS
  Filled 2021-03-14 (×5): qty 80
  Filled 2021-03-14: qty 100
  Filled 2021-03-14: qty 80
  Filled 2021-03-14: qty 100

## 2021-03-14 MED ORDER — SODIUM CHLORIDE 0.9 % IV SOLN: INTRAVENOUS | Status: DC

## 2021-03-14 MED ORDER — PROPOFOL 500 MG/50ML IV EMUL
INTRAVENOUS | Status: DC | PRN
  Administered 2021-03-14: 75 ug/kg/min via INTRAVENOUS

## 2021-03-14 MED ORDER — SODIUM CHLORIDE 0.9% IV SOLUTION: Freq: Once | INTRAVENOUS | Status: AC

## 2021-03-14 MED ORDER — PANTOPRAZOLE SODIUM 40 MG IV SOLR: 40.0000 mg | Freq: Two times a day (BID) | INTRAVENOUS | Status: DC

## 2021-03-14 MED ORDER — ACETAMINOPHEN 325 MG PO TABS
650.0000 mg | ORAL_TABLET | Freq: Once | ORAL | Status: AC
  Administered 2021-03-14: 650 mg via ORAL
  Filled 2021-03-14: qty 2

## 2021-03-14 SURGICAL SUPPLY — 15 items

## 2021-03-14 NOTE — Op Note (Signed)
[facility] Patient Name: Rachel Clark Procedure Date : 03/14/2021 MRN: [id number] Attending MD: Ronnette Juniper , MD Date of Birth: 1951/01/14 CSN: [id number] Age: 70 Admit Type: Emergency Department Procedure:                Upper GI endoscopy Indications:              Acute post hemorrhagic anemia, Coffee-ground emesis Providers:                Ronnette Juniper, MD, Particia Nearing, RN, Cherylynn Ridges,                            Technician, Theodoro Grist, CRNA Referring MD:             ER, [facility], Cardiology-Dr.Peter [name] Medicines:                Monitored Anesthesia Care Complications:            No immediate complications. Estimated blood loss:                            Minimal. Estimated Blood Loss:     Estimated blood loss was minimal. Procedure:                Pre-Anesthesia Assessment:                           - Prior to the procedure, a History and Physical                            was performed, and patient medications and                            allergies were reviewed. The patient's tolerance of                            previous anesthesia was also reviewed. The risks                            and benefits of the procedure and the sedation                            options and risks were discussed with the patient.                            All questions were answered, and informed consent                            was obtained. Prior Anticoagulants: The patient has                            taken Effient (prasugrel), last dose was day of                            procedure. ASA Grade Assessment: III - A patient  with severe systemic disease. After reviewing the                            risks and benefits, the patient was deemed in                            satisfactory condition to undergo the procedure.                           After obtaining informed consent, the endoscope was                             passed under direct vision. Throughout the                            procedure, the patient's blood pressure, pulse, and                            oxygen saturations were monitored continuously. The                            [id number] Olympus endoscope was introduced                            through the mouth, and advanced to the second part                            of duodenum. The upper GI endoscopy was                            accomplished without difficulty. The patient                            tolerated the procedure well. Scope In: Scope Out: Findings:      The upper third of the esophagus, middle third of the esophagus and       lower third of the esophagus were normal.      LA Grade A (one or more mucosal breaks less than 5 mm, not extending       between tops of 2 mucosal folds) esophagitis with no bleeding was found       at the area of GE junction.      Coffee ground, old blood was found in the gastric fundus, in the gastric       body and on the greater curvature of the stomach.      The cardia and gastric fundus were normal on retroflexion.      Red blood was found in the gastric antrum.      On lavage of the antrum, fresh blood was noted to ooze behind a       thickened fold and a visible vessel/adherent clot was noted in this       area. It was difficult to evaluate as the bleeding site was under a       fold. Area was successfully injected with 6 mL of a 1:10,000 solution of       epinephrine for  hemostasis. To stop active bleeding, five hemostatic       clips were successfully placed (MR conditional), out of which 4       endoclips stayed in the area of adherent clot. There was no bleeding at       the end of the procedure.      Coffee ground, old blood was found in the duodenal bulb, in the first       portion of the duodenum and in the second portion of the duodenum. Impression:               - Normal upper third of esophagus, middle third of                             esophagus and lower third of esophagus.                           - LA Grade A esophagitis with no bleeding.                           - Coffee ground, old blood in the greater curvature                            of the stomach, in the gastric fundus and in the                            gastric body.                           - Red blood in the gastric antrum. Visible vessel                            vs adherent clot at antrum, treated with epinphrine                            injection and endoclips placement.                           - Blood in the duodenal bulb and in the second                            portion of the duodenum.                           - No specimens collected. Moderate Sedation:      Patient did not receive moderate sedation for this procedure, but       instead received monitored anesthesia care. Recommendation:           - Clear liquid diet.                           - Give Protonix (pantoprazole): 8 mg/hr IV by                            continuous infusion.                           -  As patient had DES placed on 03/08/21, we cannot                            hold prasugrel or ASA.                           - Recommend H and H monitoring every 12 hours and                            transfuse PRBC as needed. Procedure Code(s):        --- Professional ---                           [phone], Esophagogastroduodenoscopy, flexible,                            transoral; with control of bleeding, any method Diagnosis Code(s):        --- Professional ---                           K20.90, Esophagitis, unspecified without bleeding                           K92.2, Gastrointestinal hemorrhage, unspecified                           D62, Acute posthemorrhagic anemia                           K92.0, Hematemesis CPT copyright 2019 [employer]. All rights reserved. The codes documented in this report are preliminary and upon coder review  may  be revised to meet current compliance requirements. Ronnette Juniper, MD 03/14/2021 11:01:43 AM This report has been signed electronically. Number of Addenda: 0

## 2021-03-14 NOTE — Progress Notes (Signed)
This RN received report for this patient.

## 2021-03-14 NOTE — ED Notes (Signed)
Pt transported to endo.  

## 2021-03-14 NOTE — ED Notes (Signed)
Attempted to call report. Left on hold for 42mnutes

## 2021-03-14 NOTE — Progress Notes (Signed)
Cardiology Progress Note  Patient ID: Rachel Clark MRN: [id number] DOB: [birth year] Date of Encounter: 03/14/2021  Primary Cardiologist: Peter [name], MD  Subjective   Chief Complaint: Nausea  HPI: Admitted with nausea and coffee-ground emesis.  Hemoglobin down to 7.7.  Has received transfusion.  Remains on DAPT due to recent stent.  Denies chest pain or trouble breathing.  ROS:  All other ROS reviewed and negative. Pertinent positives noted in the HPI.     Inpatient Medications  Scheduled Meds:  aspirin  81 mg Oral Daily   carbamazepine  600 mg Oral BID   DULoxetine  60 mg Oral Daily   ethosuximide  500 mg Oral BID   ezetimibe  10 mg Oral Daily   levothyroxine  50 mcg Oral Daily   memantine  10 mg Oral BID   pantoprazole (PROTONIX) IV  40 mg Intravenous Q12H   prasugrel  10 mg Oral Daily   Continuous Infusions:  sodium chloride 125 mL/hr at 03/14/21 0133   PRN Meds: acetaminophen, azelastine, hydrALAZINE, ondansetron **OR** ondansetron (ZOFRAN) IV, tiZANidine   Vital Signs   Vitals:   03/14/21 0615 03/14/21 0630 03/14/21 0645 03/14/21 0700  BP: (!) 116/58 129/63 124/60 120/72  Pulse: 100 (!) 101 93 92  Resp: (!) '21 19 19 '$ (!) 29  Temp:      TempSrc:      SpO2: 99% 100% 100% 100%  Weight:      Height:        Intake/Output Summary (Last 24 hours) at 03/14/2021 0747 Last data filed at 03/14/2021 0540 Gross per 24 hour  Intake 1350 ml  Output --  Net 1350 ml   Last 3 Weights 03/14/2021 03/08/2021 01/14/2021  Weight (lbs) 168 lb 170 lb 173 lb 9.6 oz  Weight (kg) 76.204 kg 77.111 kg 78.744 kg      Telemetry  Overnight telemetry shows sinus tachycardia heart rates in the 120 BP minute range, which I personally reviewed.   ECG  The most recent ECG shows sinus tachycardia, heart rate 108, nonspecific ST-T changes, which I personally reviewed.   Physical Exam   Vitals:   03/14/21 0615 03/14/21 0630 03/14/21 0645 03/14/21 0700  BP: (!) 116/58 129/63 124/60  120/72  Pulse: 100 (!) 101 93 92  Resp: (!) '21 19 19 '$ (!) 29  Temp:      TempSrc:      SpO2: 99% 100% 100% 100%  Weight:      Height:        Intake/Output Summary (Last 24 hours) at 03/14/2021 0747 Last data filed at 03/14/2021 0540 Gross per 24 hour  Intake 1350 ml  Output --  Net 1350 ml    Last 3 Weights 03/14/2021 03/08/2021 01/14/2021  Weight (lbs) 168 lb 170 lb 173 lb 9.6 oz  Weight (kg) 76.204 kg 77.111 kg 78.744 kg    Body mass index is 24.81 kg/m.  General: Well nourished, well developed, in no acute distress Head: Atraumatic, normal size  Eyes: PEERLA, EOMI  Neck: Supple, no JVD Endocrine: No thryomegaly Cardiac: Normal S1, S2; regular rate and rhythm, tachycardia noted, no murmurs Lungs: Clear to auscultation bilaterally, no wheezing, rhonchi or rales  Abd: Soft, nontender, no hepatomegaly  Ext: No edema, pulses 2+ Musculoskeletal: No deformities, BUE and BLE strength normal and equal Skin: Warm and dry, no rashes   Neuro: Alert and oriented to person, place, time, and situation, CNII-XII grossly intact, no focal deficits  Psych: Normal mood and affect  Labs  High Sensitivity Troponin:   Recent Labs  Lab 03/08/21 1155 03/08/21 1404  TROPONINIHS 39* 330*     Cardiac EnzymesNo results for input(s): TROPONINI in the last 168 hours. No results for input(s): TROPIPOC in the last 168 hours.  Chemistry Recent Labs  Lab 03/08/21 1155 03/08/21 1655 03/09/21 0218 03/13/21 0927 03/13/21 1013  NA 130*   < > 132* 134* 135  K 4.0   < > 3.9 4.9 4.8  CL 98  --  99 103 103  CO2 25  --  26 23  --   GLUCOSE 122*  --  102* 127* 122*  BUN 14  --  12 39* 42*  CREATININE 0.71  --  0.69 0.68 0.60  CALCIUM 8.6*  --  8.7* 8.4*  --   PROT  --   --   --  6.5  --   ALBUMIN  --   --   --  3.3*  --   AST  --   --   --  17  --   ALT  --   --   --  14  --   ALKPHOS  --   --   --  43  --   BILITOT  --   --   --  0.5  --   GFRNONAA >60  --  >60 >60  --   ANIONGAP 7  --  7 8  --     < > = values in this interval not displayed.    Hematology Recent Labs  Lab 03/08/21 1155 03/08/21 1655 03/09/21 0218 03/13/21 0927 03/13/21 1013 03/13/21 1657 03/13/21 2300 03/14/21 0338  WBC 6.6  --  8.5 10.9*  --   --   --   --   RBC 4.28  --  4.01 3.95  --   --   --   --   HGB 13.0   < > 12.1 12.0   < > 10.2* 7.8* 7.7*  HCT 38.6   < > 35.4* 35.9*   < > 29.8* 23.3* 22.6*  MCV 90.2  --  88.3 90.9  --   --   --   --   MCH 30.4  --  30.2 30.4  --   --   --   --   MCHC 33.7  --  34.2 33.4  --   --   --   --   RDW 12.5  --  12.4 12.6  --   --   --   --   PLT 368  --  319 377  --   --   --   --    < > = values in this interval not displayed.   BNPNo results for input(s): BNP, PROBNP in the last 168 hours.  DDimer No results for input(s): DDIMER in the last 168 hours.   Radiology  DG Chest 1 View  Result Date: 03/13/2021 CLINICAL DATA:  Patient complains of brown emesis EXAM: CHEST  1 VIEW COMPARISON:  March 08, 2021, February 23, 2018 FINDINGS: The heart size and mediastinal contours are stable. Cardiac pacemaker is unchanged. Both lungs are clear. Chronic change at left hemidiaphragm is stable compared to 2019. The visualized skeletal structures are unremarkable. IMPRESSION: No active disease. Electronically Signed   By: Abelardo Diesel M.D.   On: 03/13/2021 10:31    Cardiac Studies  LHC 03/08/2021   Prox LAD lesion is 100% stenosed.   Origin lesion is 100%  stenosed.   Ost LAD to Prox LAD lesion is 60% stenosed.   Ost 1st Diag to 1st Diag lesion is 85% stenosed.   Ramus lesion is 99% stenosed.   Ost Ramus to Ramus lesion is 90% stenosed.   Non-stenotic Ost RCA to Mid RCA lesion was previously treated.   A drug-eluting stent was successfully placed using a STENT ONYX FRONTIER 3.0X15.   A drug-eluting stent was successfully placed using a SYNERGY XD 3.0X16.   A drug-eluting stent was successfully placed using a SYNERGY XD 3.0X20.   Post intervention, there is a 0% residual  stenosis.   Post intervention, there is a 0% residual stenosis.   The left ventricular systolic function is normal.   LV end diastolic pressure is normal.   The left ventricular ejection fraction is 55-65% by visual estimate.   The radiation dose exceeded thresholds defined in the "Patient Radiation Dose Management For Interventional Medical Procedures With Extensive Use of Fluoroscopy" policy. Specific follow up instructions will be provided to the patient prior to discharge.   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 This is the culprit Normal LV function Normal LVEDP Successful PCI of the Ramus intermediate with DES x 3 in overlapping fashion.  Patient Profile  Rachel Clark is a 70 y.o. female with CAD status post CABG with recent PCI to the ramus on 03/08/2021, complete heart block status post permanent pacemaker implantation, hypertension, hyperlipidemia, orthostatic hypotension who was admitted on 03/13/2021 with acute blood loss anemia secondary to likely GI bleed.  Assessment & Plan   #Hematemesis/acute blood loss anemia/GI bleed/recent PCI -Admitted with coffee-ground emesis.  Hemoglobin is dropped to 7.7. -No chest pain or trouble breathing. -Presumed upper GI bleed. -She underwent PCI to her native ramus intermedius artery on 03/08/2021.  Unfortunately she is less than 4 weeks from her stent placement.  She is within a few days.   -She is extremely high risk for stent thrombosis if we stop her DAPT.   -For now I would recommend to continue DAPT.  I would not recommend to stop this.  She does need gastroenterology evaluation and I think possible endoscopy without intervention to see what we are dealing with is the best strategy.  Again I would not recommend holding her DAPT. She will need to be treated supportively through her  bleed.  We will discuss further with gastroenterology after their evaluation.  For questions or updates, please contact [place] Please consult [email or web address] for contact info under   Time Spent with Patient: I have spent a total of 35 minutes with patient reviewing hospital notes, telemetry, EKGs, labs and examining the patient as well as establishing an assessment and plan that was discussed with the patient.  > 50% of time was spent in direct patient care.    Signed, Addison Naegeli. Audie Box, MD, [place]  03/14/2021 7:47 AM

## 2021-03-14 NOTE — ED Notes (Signed)
MD notified of repeat H&H lab of 7.8 and will repeat as I drew it off the IV. Straight stick for repeat H&H 5AM labs drawn now. Patient has not had any coffee ground emesis this shift. She denies melena, no BM this shift.

## 2021-03-14 NOTE — Progress Notes (Signed)
PROGRESS NOTE    Rachel Clark  [id number] DOB: 1951-03-01 DOA: 03/13/2021 PCP: Harlan Stains, MD    Brief Narrative: 70 year old female with CAD recent  NSTEMI with Stent x3, on ASA, Effient, HTN, HLD, Seizure Disorder/Memory Loss, Colon Cancer S/P resection Presents with Coffee-Ground Material Vomiting x 3 days.  Recently she has been experiencing orthostatic hypotension and syncope and was advised to reduce sodium in her diet and drink Gatorade. Previous Colonoscopy 04/2018: 6 tubular adenomas and one sessile serrated adenoma removed, repeat recommended in 3 years Colonoscopy 2014: Normal-appearing anastomosis, recommended repeat in 3 years Colonoscopy 2013: Adenocarcinoma of transverse colon Laparoscopic transverse colectomy in 2013.  In ED BP-borderline given-received  NNS BOLUS, hba 12.1, 4 days PTA, GI and cardio was consulted and admitted.  Subjective: Seen and examined this morning in the ED.  Patient resting comfortably no complaints.  Husband at the bedside Overnight vitals stable afebrile T-max 99.1 Hemoglobin down trended 7.7 gm Was waiting for EGD Assessment & Plan:  Coffee-ground emesis Upper GI bleeding Acute blood loss anemia due to GI bleed: baseline hh 12 gm BUT ACUTE drop in hemoglobin overnight suspecting component of hemodilution due to IV fluids as well. GI on board planning for diagnostic endoscopy.  Monitor serial H&H and transfuse to keep over 8 gram.  Completed 1 unit PRBC transfusion hemoglobin up trended to 8.  Discussed with GI after EGD-that revealed LA grade a esophagitis, coffee-ground, or blood in the greater curvature distal vessels versus adherent clot at antrum treated with epinephrine injection and endoclips placement.  Plan is to continue clear liquid diet, PPI drip and monitor H&H q12h. Recent Labs  Lab 03/13/21 1013 03/13/21 1657 03/13/21 2300 03/14/21 0338 03/14/21 1211  HGB 11.9* 10.2* 7.8* 7.7* 8.0*  HCT 35.0* 29.8* 23.3* 22.6*  24.1*    CAD recent  NSTEMI with Stent x3:on ASA and Effient at home HTN-recent hypotension at home HLD: Cardio on board, due to recent stent-continued on DAPT, Zetia.  Holding coreg. 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 Yes; Fluid consistency: Thin  Diet effective now                  Patient's Body mass index is 24.81 kg/m.  DVT prophylaxis: SCDs Start: 03/13/21 1500 Code Status:   Code Status: Full Code  Family Communication: plan of care discussed with patient at bedside. Status is: Inpatient Remains inpatient appropriate because:IV treatments appropriate due to intensity of illness or inability to take PO and Inpatient level of care appropriate due to severity of illness Dispo: The patient is from: Home              Anticipated d/c is to: Home              Patient currently is not medically stable to d/c.   Difficult to place patient No  Unresulted Labs (From admission, onward)     Start     Ordered   03/15/21 [id number]  Basic metabolic panel  Daily,   R     Question:  Specimen collection method  Answer:  Lab=Lab collect   03/14/21 0827   03/15/21 0500  CBC  Daily,   R     Question:  Specimen collection method  Answer:  Lab=Lab collect   03/14/21 0827  03/14/21 1015  Hemoglobin and hematocrit, blood  Now then every 6 hours,   R (with STAT occurrences)     Question:  Specimen collection method  Answer:  Lab=Lab collect   03/14/21 0827   03/14/21 0457  SARS CORONAVIRUS 2 (TAT 6-24 HRS) Nasopharyngeal Nasopharyngeal Swab  (Tier 3 - Symptomatic/asymptomatic)  Once,   STAT       Question Answer Comment  Is this test for diagnosis or screening Screening   Symptomatic for COVID-19 as defined by CDC No   Hospitalized for COVID-19 No   Admitted to ICU for COVID-19 No   Previously tested for COVID-19 Yes    Resident in a congregate (group) care setting No   Employed in healthcare setting No   Pregnant No   Has patient completed COVID vaccination(s) (2 doses of Pfizer/Moderna 1 dose of [employer]) Unknown      03/14/21 0457           Medications reviewed:  Scheduled Meds:  aspirin  81 mg Oral Daily   carbamazepine  600 mg Oral BID   DULoxetine  60 mg Oral Daily   ethosuximide  500 mg Oral BID   ezetimibe  10 mg Oral Daily   levothyroxine  50 mcg Oral Daily   memantine  10 mg Oral BID   [START ON 03/17/2021] pantoprazole  40 mg Intravenous Q12H   prasugrel  10 mg Oral Daily   Continuous Infusions:  sodium chloride Stopped (03/14/21 1044)   pantoprazole 8 mg/hr (03/14/21 1259)   Consultants:see note  Procedures:see note Antimicrobials: Anti-infectives (From admission, onward)    None      Culture/Microbiology    Component Value Date/Time   SDES URINE, CLEAN CATCH 05/07/2012 1325   SPECREQUEST NONE 05/07/2012 1325   CULT ESCHERICHIA COLI 05/07/2012 1325   REPTSTATUS 05/09/2012 FINAL 05/07/2012 1325    Other culture-see note  Objective: Vitals: Today's Vitals   03/14/21 1115 03/14/21 1120 03/14/21 1125 03/14/21 1132  BP: (!) 146/67  (!) 167/65   Pulse: (!) 102 (!) 102 99   Resp: (!) 24 19 (!) 23   Temp:      TempSrc:      SpO2: 100% 100% 100%   Weight:      Height:      PainSc:    6     Intake/Output Summary (Last 24 hours) at 03/14/2021 1334 Last data filed at 03/14/2021 0540 Gross per 24 hour  Intake 350 ml  Output --  Net 350 ml   Filed Weights   03/14/21 0133  Weight: 76.2 kg   Weight change:   Intake/Output from previous day: 09/11 0701 - 09/12 0700 In: 1350 [Blood:350; IV Piggyback:1000] Out: -  Intake/Output this shift: No intake/output data recorded. Filed Weights   03/14/21 0133  Weight: 76.2 kg   Examination: General exam: AAOx3, pleasant, not in distress HEENT:Oral mucosa moist, Ear/Nose WNL grossly,dentition  normal. Respiratory system: bilaterally clear breath sounds, no use of accessory muscle, non tender. Cardiovascular system: S1 & S2 +,No JVD. Gastrointestinal system: Abdomen soft, NT,ND, BS+. Nervous System:Alert, awake, moving extremities Extremities: No edema, distal peripheral pulses palpable.  Skin: No rashes,no icterus. MSK: Normal muscle bulk,tone, power.  Data Reviewed: I have personally reviewed following labs and imaging studies CBC: Recent Labs  Lab 03/08/21 1155 03/08/21 1655 03/09/21 0218 03/13/21 0927 03/13/21 1013 03/13/21 1657 03/13/21 2300 03/14/21 0338 03/14/21 1211  WBC 6.6  --  8.5 10.9*  --   --   --   --   --  NEUTROABS  --   --   --  8.5*  --   --   --   --   --   HGB 13.0   < > 12.1 12.0 11.9* 10.2* 7.8* 7.7* 8.0*  HCT 38.6   < > 35.4* 35.9* 35.0* 29.8* 23.3* 22.6* 24.1*  MCV 90.2  --  88.3 90.9  --   --   --   --   --   PLT 368  --  319 377  --   --   --   --   --    < > = values in this interval not displayed.   Basic Metabolic Panel: Recent Labs  Lab 03/08/21 1155 03/08/21 1655 03/09/21 0218 03/13/21 0927 03/13/21 1013  NA 130* 105* 132* 134* 135  K 4.0 3.0* 3.9 4.9 4.8  CL 98  --  99 103 103  CO2 25  --  26 23  --   GLUCOSE 122*  --  102* 127* 122*  BUN 14  --  12 39* 42*  CREATININE 0.71  --  0.69 0.68 0.60  CALCIUM 8.6*  --  8.7* 8.4*  --    GFR: Estimated Creatinine Clearance: 67.4 mL/min (by C-G formula based on SCr of 0.6 mg/dL). Liver Function Tests: Recent Labs  Lab 03/13/21 0927  AST 17  ALT 14  ALKPHOS 43  BILITOT 0.5  PROT 6.5  ALBUMIN 3.3*   Recent Labs  Lab 03/13/21 0927  LIPASE 22   No results for input(s): AMMONIA in the last 168 hours. Coagulation Profile: Recent Labs  Lab 03/13/21 0927  INR 1.1   Cardiac Enzymes: No results for input(s): CKTOTAL, CKMB, CKMBINDEX, TROPONINI in the last 168 hours. BNP (last 3 results) No results for input(s): PROBNP in the last 8760 hours. HbA1C: No results for  input(s): HGBA1C in the last 72 hours. CBG: No results for input(s): GLUCAP in the last 168 hours. Lipid Profile: No results for input(s): CHOL, HDL, LDLCALC, TRIG, CHOLHDL, LDLDIRECT in the last 72 hours. Thyroid Function Tests: No results for input(s): TSH, T4TOTAL, FREET4, T3FREE, THYROIDAB in the last 72 hours. Anemia Panel: No results for input(s): VITAMINB12, FOLATE, FERRITIN, TIBC, IRON, RETICCTPCT in the last 72 hours. Sepsis Labs: Recent Labs  Lab 03/13/21 0958  LATICACIDVEN 1.7    Recent Results (from the past 240 hour(s))  Resp Panel by RT-PCR (Flu A&B, Covid) Nasopharyngeal Swab     Status: None   Collection Time: 03/08/21  1:30 PM   Specimen: Nasopharyngeal Swab; Nasopharyngeal(NP) swabs in vial transport medium  Result Value Ref Range Status   SARS Coronavirus 2 by RT PCR NEGATIVE NEGATIVE Final    Comment: (NOTE) SARS-CoV-2 target nucleic acids are NOT DETECTED.  The SARS-CoV-2 RNA is generally detectable in upper respiratory specimens during the acute phase of infection. The lowest concentration of SARS-CoV-2 viral copies this assay can detect is 138 copies/mL. A negative result does not preclude SARS-Cov-2 infection and should not be used as the sole basis for treatment or other patient management decisions. A negative result may occur with  improper specimen collection/handling, submission of specimen other than nasopharyngeal swab, presence of viral mutation(s) within the areas targeted by this assay, and inadequate number of viral copies(<138 copies/mL). A negative result must be combined with clinical observations, patient history, and epidemiological information. The expected result is Negative.  Fact Sheet for Patients:  EntrepreneurPulse.com.au  Fact Sheet for Healthcare Providers:  IncredibleEmployment.be  This test is no t  yet approved or cleared by the [place] and  has been authorized for detection  and/or diagnosis of SARS-CoV-2 by FDA under an Emergency Use Authorization (EUA). This EUA will remain  in effect (meaning this test can be used) for the duration of the COVID-19 declaration under Section 564(b)(1) of the Act, 21 U.S.C.section 360bbb-3(b)(1), unless the authorization is terminated  or revoked sooner.       Influenza A by PCR NEGATIVE NEGATIVE Final   Influenza B by PCR NEGATIVE NEGATIVE Final    Comment: (NOTE) The Xpert Xpress SARS-CoV-2/FLU/RSV plus assay is intended as an aid in the diagnosis of influenza from Nasopharyngeal swab specimens and should not be used as a sole basis for treatment. Nasal washings and aspirates are unacceptable for Xpert Xpress SARS-CoV-2/FLU/RSV testing.  Fact Sheet for Patients: EntrepreneurPulse.com.au  Fact Sheet for Healthcare Providers: IncredibleEmployment.be  This test is not yet approved or cleared by the [place] FDA and has been authorized for detection and/or diagnosis of SARS-CoV-2 by FDA under an Emergency Use Authorization (EUA). This EUA will remain in effect (meaning this test can be used) for the duration of the COVID-19 declaration under Section 564(b)(1) of the Act, 21 U.S.C. section 360bbb-3(b)(1), unless the authorization is terminated or revoked.  Performed at [facility], [place] [address], [place], [place] [address]      Radiology Studies: DG Chest 1 View  Result Date: 03/13/2021 CLINICAL DATA:  Patient complains of brown emesis EXAM: CHEST  1 VIEW COMPARISON:  March 08, 2021, February 23, 2018 FINDINGS: The heart size and mediastinal contours are stable. Cardiac pacemaker is unchanged. Both lungs are clear. Chronic change at left hemidiaphragm is stable compared to 2019. The visualized skeletal structures are unremarkable. IMPRESSION: No active disease. Electronically Signed   By: Abelardo Diesel M.D.   On: 03/13/2021 10:31     LOS: 1 day   Antonieta Pert,  MD [facility]  03/14/2021, 1:34 PM

## 2021-03-14 NOTE — Consult Note (Signed)
[facility] Consult  Referring Provider: ER Primary Care Physician:  Harlan Stains, MD Primary Gastroenterologist: Dr. Michail Sermon  Reason for Consultation: Coffee-ground emesis  HPI: Rachel Clark is a 70 y.o. female with coronary artery disease, status post CABG with recent PCI on 03/08/2021, drug-eluting stent x3 placed in overlapping fashion, on Effient and aspirin at home. Initially loaded with Brilinta and cardiac cath, transition to Effient loading dose later continued at 10 mg daily.  She was in her usual state of health until 8 AM yesterday morning when her husband found her sitting on a shower bench with coffee-ground emesis covering the wash basin. He called EMS, she was found to be hypotensive, and had 2 more episodes of coffee-ground emesis. Since arrival patient has not had any more vomiting. She had 1 bowel movement yesterday which was dark but is not sure if it was black. She denies abdominal pain or rectal pain. At home she takes Tums however states this was advised by oncology for calcium supplementation, denies heartburn, acid reflux, difficulty swallowing or pain on swallowing.  She denies use of NSAIDs, but states she has history of peptic ulcer disease in the past.  Patient has been experiencing orthostatic hypotension and syncope and was advised to increase sodium in her diet and drink Gatorade.  Colonoscopy 04/2018: 6 tubular adenomas and one sessile serrated adenoma removed, repeat recommended in 3 years Colonoscopy 2014: Normal-appearing anastomosis, recommended repeat in 3 years Colonoscopy 2013: Adenocarcinoma of transverse colon Laparoscopic transverse colectomy in 2013   Past Medical History:  Diagnosis Date   Arthritis    "fingers" (06/29/2016)   Chronic lower back pain    Colon cancer ([place]) 12/04/2011   s/p Laparoscopic-assisted transverse colectomy on 12/19/2011 by Dr. Donne Hazel.  pT3 N0 M0.    Coronary artery disease    a. remote NSTEMI  with PCI to the RCA; b. S/P CABG in 2007; c. 2010 - 2 v CAD with L-LAD and S-RCA patent, small caliber Dx 70% (treated medically - no amenable to PCI), CFX free of significant disease, normal LVF. d. 10/2014 Lexi MV: EF 61%, no ischemia/infarct.   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, controlled ([place]) 12/06/2011   last seizure was in 1972 ;takes Tegretol (06/29/2016)    Headache    "weekly" (06/29/2016)   Hyperlipidemia    Hypothyroid    Iron deficiency anemia 11/17/2011   Memory change 11/13/2016   Monoclonal gammopathy of unknown significance    Orthostatic hypotension 12/21/2020   Presence of permanent cardiac pacemaker    Syncope and collapse    pacemaker implanted    Past Surgical History:  Procedure Laterality Date   ANTERIOR CERVICAL DECOMP/DISCECTOMY FUSION  1980's   BACK SURGERY     CARDIAC CATHETERIZATION  05/20/2009   obstructive native vessel disease in LAD, RCA, and first diagonal, patent vein graft to distal RCA and LIMA to LAD,normal. ef 60%   CARDIAC CATHETERIZATION N/A 03/24/2015   Procedure: Left Heart Cath and Cors/Grafts Angiography;  Surgeon: Wellington Hampshire, MD;  Location: [facility];  Service: Cardiovascular;  Laterality: N/A;   CARDIAC CATHETERIZATION N/A 07/28/2015   Procedure: Left Heart Cath and Coronary Angiography;  Surgeon: Troy Sine, MD;  Location: [place] [facility];  Service: Cardiovascular;  Laterality: N/A;   CARDIAC CATHETERIZATION N/A 11/09/2015   Procedure: Coronary Stent Intervention;  Surgeon: Sherren Mocha,  MD;  Location: [place] [facility];  Service: Cardiovascular;  Laterality: N/A;   CARDIAC CATHETERIZATION N/A 11/09/2015   Procedure: Left Heart Cath and Coronary Angiography;  Surgeon: Sherren Mocha, MD;  Location: [place] [facility];  Service: Cardiovascular;  Laterality: N/A;   CARDIAC CATHETERIZATION N/A 06/29/2016   Procedure: Left Heart  Cath and Coronary Angiography;  Surgeon: Nelva Bush, MD;  Location: [place] [facility];  Service: Cardiovascular;  Laterality: N/A;   CARDIAC CATHETERIZATION N/A 06/29/2016   Procedure: Intravascular Pressure Wire/FFR Study;  Surgeon: Nelva Bush, MD;  Location: [facility];  Service: Cardiovascular;  Laterality: N/A;   CARDIAC CATHETERIZATION N/A 06/29/2016   Procedure: Coronary Balloon Angioplasty;  Surgeon: Nelva Bush, MD;  Location: [facility] [facility];  Service: Cardiovascular;  Laterality: N/A;   COLON RESECTION  12/19/2011   Procedure: COLON RESECTION LAPAROSCOPIC;  Surgeon: Rolm Bookbinder, MD;  Location: [facility];  Service: General;  Laterality: N/A;  laparoscopic hand assisted partial colon resection   COLON SURGERY     COLONOSCOPY     CORONARY ANGIOPLASTY WITH STENT PLACEMENT  ~ 2007   1 stent   CORONARY ARTERY BYPASS GRAFT  2007   "CABG X2"   DILATION AND CURETTAGE OF UTERUS  1973   EP IMPLANTABLE DEVICE N/A 03/25/2015   MDT Advisa DR pacemaker implanted by Dr Rayann Heman for transient complete heart block and syncope   INSERT / REPLACE / REMOVE PACEMAKER     LEFT HEART CATH AND CORS/GRAFTS ANGIOGRAPHY N/A 02/21/2018   Procedure: LEFT HEART CATH AND CORS/GRAFTS ANGIOGRAPHY;  Surgeon: Lorretta Harp, MD;  Location: [place] [facility];  Service: Cardiovascular;  Laterality: N/A;   LEFT HEART CATH AND CORS/GRAFTS ANGIOGRAPHY N/A 03/08/2021   Procedure: LEFT HEART CATH AND CORS/GRAFTS ANGIOGRAPHY;  Surgeon: [name], Peter M, MD;  Location: [place] [facility];  Service: Cardiovascular;  Laterality: N/A;   PORT-A-CATH REMOVAL N/A 06/12/2013   Procedure: REMOVAL PORT-A-CATH;  Surgeon: Rolm Bookbinder, MD;  Location: [place] [facility];  Service: General;  Laterality: N/A;   PORTACATH PLACEMENT  06/04/2012   Procedure: INSERTION PORT-A-CATH;  Surgeon: Rolm Bookbinder, MD;  Location: [place];  Service: General;  Laterality: N/A;  Insertion of port-a-cath    POSTERIOR  LAMINECTOMY / [place]      Prior to Admission medications   Medication Sig Start Date End Date Taking? Authorizing Provider  acetaminophen (TYLENOL) 500 MG tablet Take 500-1,000 mg by mouth daily as needed for mild pain or headache.   Yes [provider]  aspirin 81 MG chewable tablet Chew 1 tablet (81 mg total) by mouth daily. 06/30/16  Yes Arbutus Leas, NP  Azelastine HCl 0.15 % SOLN Place 1 spray into both nostrils daily as needed (seasonal allergies).  12/22/14  Yes [provider]  Bempedoic Acid-Ezetimibe (NEXLIZET) 180-10 MG TABS Take 180 mg by mouth daily. 12/29/20  Yes [name], Peter M, MD  carbamazepine (TEGRETOL XR) 200 MG 12 hr tablet Take 3 tablets (600 mg total) by mouth 2 (two) times daily. 01/19/21  Yes Kathrynn Ducking, MD  carvedilol (COREG) 3.125 MG tablet Take 1 tablet (3.125 mg total) by mouth 2 (two) times daily. 09/10/20 09/05/21 Yes [name], Peter M, MD  DULoxetine (CYMBALTA) 60 MG capsule Take 60 mg by mouth daily.    Yes [provider]  ethosuximide (ZARONTIN) 250 MG capsule TAKE 2 CAPSULES IN THE MORNING, TAKE 2 CAPSULES AT NIGHT 01/19/21  Yes Suzzanne Cloud, NP  levothyroxine (SYNTHROID, LEVOTHROID) 50 MCG tablet Take 50 mcg by mouth daily.   Yes [provider]  memantine (NAMENDA) 10 MG tablet Take 1 tablet (10 mg total) by mouth 2 (two) times daily. 08/17/20  Yes Suzzanne Cloud, NP  nitroGLYCERIN (NITROSTAT) 0.4 MG SL tablet Place 1 tablet (0.4 mg total) under the tongue every 5 (five) minutes as needed. For chest pain. 09/10/20  Yes [name], Peter M, MD  prasugrel (EFFIENT) 10 MG TABS tablet Take 1 tablet (10 mg total) by mouth daily. 03/10/21  Yes Jerline Pain, MD  tiZANidine (ZANAFLEX) 2 MG tablet Take by mouth every 6 (six) hours as needed for muscle spasms.   Yes [provider]    Current Facility-Administered Medications  Medication Dose Route Frequency Provider Last Rate  Last Admin   0.9 %  sodium chloride infusion   Intravenous Continuous Lequita Halt, MD 125 mL/hr at 03/14/21 0133 New Bag at 03/14/21 0133   acetaminophen (TYLENOL) tablet 500-1,000 mg  500-1,000 mg Oral Daily PRN Wynetta Fines T, MD       aspirin chewable tablet 81 mg  81 mg Oral Daily Roosevelt Locks, Ping T, MD   81 mg at 03/13/21 1724   azelastine (ASTELIN) 0.1 % nasal spray 1 spray  1 spray Each Nare Daily PRN Heloise Purpura, RPH       carbamazepine (TEGRETOL XR) 12 hr tablet 600 mg  600 mg Oral BID Wynetta Fines T, MD   600 mg at 03/13/21 1726   DULoxetine (CYMBALTA) DR capsule 60 mg  60 mg Oral Daily Wynetta Fines T, MD   60 mg at 03/13/21 1724   ethosuximide (ZARONTIN) capsule 500 mg  500 mg Oral BID Heloise Purpura, RPH   500 mg at 03/14/21 0215   ezetimibe (ZETIA) tablet 10 mg  10 mg Oral Daily Heloise Purpura, RPH   10 mg at 03/13/21 1724   hydrALAZINE (APRESOLINE) injection 5 mg  5 mg Intravenous Q6H PRN Wynetta Fines T, MD       levothyroxine (SYNTHROID) tablet 50 mcg  50 mcg Oral Daily Wynetta Fines T, MD   50 mcg at 03/14/21 0538   memantine (NAMENDA) tablet 10 mg  10 mg Oral BID Wynetta Fines T, MD   10 mg at 03/13/21 1726   ondansetron (ZOFRAN) tablet 4 mg  4 mg Oral Q6H PRN Wynetta Fines T, MD       Or   ondansetron [facility]) injection 4 mg  4 mg Intravenous Q6H PRN Wynetta Fines T, MD       pantoprazole (PROTONIX) injection 40 mg  40 mg Intravenous Q12H Wynetta Fines T, MD   40 mg at 03/13/21 2145   prasugrel (EFFIENT) tablet 10 mg  10 mg Oral Daily Wynetta Fines T, MD   10 mg at 03/13/21 1724   tiZANidine (ZANAFLEX) tablet 2 mg  2 mg Oral Q6H PRN Lequita Halt, MD       Current Outpatient Medications  Medication Sig Dispense Refill   acetaminophen (TYLENOL) 500 MG tablet Take 500-1,000 mg by mouth daily as needed for mild pain or headache.     aspirin 81 MG chewable tablet Chew 1 tablet (81 mg total) by mouth daily.     Azelastine HCl 0.15 % SOLN Place 1 spray into both nostrils daily as  needed (seasonal allergies).   12   Bempedoic Acid-Ezetimibe (NEXLIZET) 180-10 MG TABS Take 180 mg by mouth  daily. 90 tablet 3   carbamazepine (TEGRETOL XR) 200 MG 12 hr tablet Take 3 tablets (600 mg total) by mouth 2 (two) times daily. 180 tablet 11   carvedilol (COREG) 3.125 MG tablet Take 1 tablet (3.125 mg total) by mouth 2 (two) times daily. 180 tablet 3   DULoxetine (CYMBALTA) 60 MG capsule Take 60 mg by mouth daily.      ethosuximide (ZARONTIN) 250 MG capsule TAKE 2 CAPSULES IN THE MORNING, TAKE 2 CAPSULES AT NIGHT 360 capsule 3   levothyroxine (SYNTHROID, LEVOTHROID) 50 MCG tablet Take 50 mcg by mouth daily.     memantine (NAMENDA) 10 MG tablet Take 1 tablet (10 mg total) by mouth 2 (two) times daily. 180 tablet 3   nitroGLYCERIN (NITROSTAT) 0.4 MG SL tablet Place 1 tablet (0.4 mg total) under the tongue every 5 (five) minutes as needed. For chest pain. 25 tablet 11   prasugrel (EFFIENT) 10 MG TABS tablet Take 1 tablet (10 mg total) by mouth daily. 90 tablet 3   tiZANidine (ZANAFLEX) 2 MG tablet Take by mouth every 6 (six) hours as needed for muscle spasms.      Allergies as of 03/13/2021 - Review Complete 03/13/2021  Allergen Reaction Noted   Aspirin Other (See Comments) 12/30/2010   Capecitabine  11/10/2020   Crestor [rosuvastatin]  12/28/2020   Erythromycin Nausea Only 12/23/2010   Fluticasone  11/10/2020   Lipitor [atorvastatin]  12/28/2020   Lisinopril Cough 12/23/2010   Niacin and related Other (See Comments) 12/30/2010   Statins Other (See Comments) 12/23/2010   Sulfa drugs cross reactors Swelling 12/23/2010   Sulfamethoxazole  11/10/2020   Tetracyclines & related Nausea Only 12/23/2010   Penicillins Nausea Only and Rash 12/23/2010   Plavix [clopidogrel bisulfate] Rash 02/20/2018    Family History  Problem Relation Age of Onset   Heart attack Father    Heart disease Father    Heart attack Brother 73   Cancer Mother        lung   Stroke Neg Hx     Social History    Socioeconomic History   Marital status: Married    Spouse name: Not on file   Number of children: 2   Years of education: 12   Highest education level: Not on file  Occupational History   Occupation: office work    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   Patient is right handed.   Patient has a high school education with some college education.   Patient drinks 2 cups daily.   Social Determinants of Health   Financial Resource Strain: Not on file  Food Insecurity: Not on file  Transportation Needs: Not on file  Physical Activity: Not on file  Stress: Not  on file  Social Connections: Not on file  Intimate Partner Violence: Not on file    Review of Systems: Positive for: GI: Described in detail in HPI.    Gen: fatigue, weakness, malaise, involuntary weight loss,Denies any fever, chills, rigors, night sweats, anorexia  and sleep disorder CV: syncope,Denies chest pain, angina, palpitations,  orthopnea, PND, peripheral edema, and claudication. Resp: Denies dyspnea, cough, sputum, wheezing, coughing up blood. GU : Denies urinary burning, blood in urine, urinary frequency, urinary hesitancy, nocturnal urination, and urinary incontinence. MS: Denies joint pain or swelling.  Denies muscle weakness, cramps, atrophy.  Derm: Denies rash, itching, oral ulcerations, hives, unhealing ulcers.  Psych: Denies  depression, anxiety, memory loss, suicidal ideation, hallucinations,  and confusion. Heme: Denies bruising and enlarged lymph nodes. Neuro:  Denies any headaches, paresthesias. Endo:  dizziness, Denies any problems with DM, thyroid, adrenal function.  Physical Exam: Vital signs in last 24 hours: Temp:  [97.8 F (36.6 C)-99.1 F (37.3 C)] 98.9 F (37.2 C) (09/12 0610) Pulse Rate:  [89-135] 100 (09/12 0745) Resp:  [12-29] 22 (09/12 0745) BP: (82-173)/(53-123) 113/69 (09/12 0745) SpO2:  [88 %-100 %] 100 % (09/12 0745) Weight:  [76.2 kg] 76.2 kg (09/12 0133)    General:   Alert,  Well-developed, well-nourished, pleasant and cooperative in NAD Head:  Normocephalic and atraumatic. Eyes:  Sclera clear, no icterus.   Mild pallor Ears:  Normal auditory acuity. Nose:  No deformity, discharge,  or lesions. Mouth:  No deformity or lesions.  Oropharynx pink & moist. Neck:  Supple; no masses or thyromegaly. Lungs:  Clear throughout to auscultation.   No wheezes, crackles, or rhonchi. No acute distress. Heart: Slightly tachycardic Extremities:  Without clubbing or edema. Neurologic:  Alert and  oriented x4;  grossly normal neurologically. Skin:  Intact without significant lesions or rashes. Psych:  Alert and cooperative. Normal mood and affect. Abdomen:  Soft, nontender and nondistended. No masses, hepatosplenomegaly or hernias noted. Normal bowel sounds, without guarding, and without rebound, well-healed surgical incisions     Lab Results: Recent Labs    03/13/21 0927 03/13/21 1013 03/13/21 1657 03/13/21 2300 03/14/21 0338  WBC 10.9*  --   --   --   --   HGB 12.0   < > 10.2* 7.8* 7.7*  HCT 35.9*   < > 29.8* 23.3* 22.6*  PLT 377  --   --   --   --    < > = values in this interval not displayed.   BMET Recent Labs    03/13/21 0927 03/13/21 1013  NA 134* 135  K 4.9 4.8  CL 103 103  CO2 23  --   GLUCOSE 127* 122*  BUN 39* 42*  CREATININE 0.68 0.60  CALCIUM 8.4*  --     LFT Recent Labs    03/13/21 0927  PROT 6.5  ALBUMIN 3.3*  AST 17  ALT 14  ALKPHOS 43  BILITOT 0.5   PT/INR Recent Labs    03/13/21 0927  LABPROT 13.7  INR 1.1    Studies/Results: DG Chest 1 View  Result Date: 03/13/2021 CLINICAL DATA:  Patient complains of brown emesis EXAM: CHEST  1 VIEW COMPARISON:  March 08, 2021, February 23, 2018 FINDINGS: The heart size and mediastinal contours are stable. Cardiac pacemaker is unchanged. Both lungs are clear. Chronic change at left hemidiaphragm is stable compared to 2019. The visualized skeletal structures are unremarkable. IMPRESSION: No active disease. Electronically Signed   By: Mallie Darting.D.  On: 03/13/2021 10:31    Impression: Coffee-ground emesis Hypotensive at home, on IV fluids at 125 mL/h Remains slightly tachycardic, no longer hypotensive Baseline hemoglobin around 12, progressive H&H monitoring shows declining trend 11.9/10.2/7.8/7.7 Elevated BUN/creatinine ratio 42/0.6 compatible with upper GI bleeding FOBT positive  Plan: Due to high risk of stent thrombosis and MI with discontinuation of dual antiplatelet therapy, as per cardiology patient needs to be continued on prasugrel and aspirin.  Although patient has not had further episodes of coffee-ground emesis, and drop in hemoglobin is possibly hemodilutional with IV fluid resuscitation, we will plan a diagnostic endoscopy today morning.  Intervention during endoscopy will be limited due to ongoing treatment with prasugrel and aspirin.  The intention is to identify the source of bleeding, determine risk of re-bleeding, and treat medically if possible.  The risks and the benefits of the procedure were discussed in details with the patient and her husband at bedside in the ED.  They verbalize understanding and consent.   LOS: 1 day   Ronnette Juniper, MD  03/14/2021, 8:01 AM

## 2021-03-14 NOTE — H&P (View-Only) (Signed)
[facility] Consult  Referring Provider: ER Primary Care Physician:  Harlan Stains, MD Primary Gastroenterologist: Dr. Michail Sermon  Reason for Consultation: Coffee-ground emesis  HPI: Rachel Clark is a 70 y.o. female with coronary artery disease, status post CABG with recent PCI on 03/08/2021, drug-eluting stent x3 placed in overlapping fashion, on Effient and aspirin at home. Initially loaded with Brilinta and cardiac cath, transition to Effient loading dose later continued at 10 mg daily.  She was in her usual state of health until 8 AM yesterday morning when her husband found her sitting on a shower bench with coffee-ground emesis covering the wash basin. He called EMS, she was found to be hypotensive, and had 2 more episodes of coffee-ground emesis. Since arrival patient has not had any more vomiting. She had 1 bowel movement yesterday which was dark but is not sure if it was black. She denies abdominal pain or rectal pain. At home she takes Tums however states this was advised by oncology for calcium supplementation, denies heartburn, acid reflux, difficulty swallowing or pain on swallowing.  She denies use of NSAIDs, but states she has history of peptic ulcer disease in the past.  Patient has been experiencing orthostatic hypotension and syncope and was advised to increase sodium in her diet and drink Gatorade.  Colonoscopy 04/2018: 6 tubular adenomas and one sessile serrated adenoma removed, repeat recommended in 3 years Colonoscopy 2014: Normal-appearing anastomosis, recommended repeat in 3 years Colonoscopy 2013: Adenocarcinoma of transverse colon Laparoscopic transverse colectomy in 2013   Past Medical History:  Diagnosis Date   Arthritis    "fingers" (06/29/2016)   Chronic lower back pain    Colon cancer ([place]) 12/04/2011   s/p Laparoscopic-assisted transverse colectomy on 12/19/2011 by Dr. Donne Hazel.  pT3 N0 M0.    Coronary artery disease    a. remote NSTEMI  with PCI to the RCA; b. S/P CABG in 2007; c. 2010 - 2 v CAD with L-LAD and S-RCA patent, small caliber Dx 70% (treated medically - no amenable to PCI), CFX free of significant disease, normal LVF. d. 10/2014 Lexi MV: EF 61%, no ischemia/infarct.   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, controlled ([place]) 12/06/2011   last seizure was in 1972 ;takes Tegretol (06/29/2016)    Headache    "weekly" (06/29/2016)   Hyperlipidemia    Hypothyroid    Iron deficiency anemia 11/17/2011   Memory change 11/13/2016   Monoclonal gammopathy of unknown significance    Orthostatic hypotension 12/21/2020   Presence of permanent cardiac pacemaker    Syncope and collapse    pacemaker implanted    Past Surgical History:  Procedure Laterality Date   ANTERIOR CERVICAL DECOMP/DISCECTOMY FUSION  1980's   BACK SURGERY     CARDIAC CATHETERIZATION  05/20/2009   obstructive native vessel disease in LAD, RCA, and first diagonal, patent vein graft to distal RCA and LIMA to LAD,normal. ef 60%   CARDIAC CATHETERIZATION N/A 03/24/2015   Procedure: Left Heart Cath and Cors/Grafts Angiography;  Surgeon: Wellington Hampshire, MD;  Location: [facility];  Service: Cardiovascular;  Laterality: N/A;   CARDIAC CATHETERIZATION N/A 07/28/2015   Procedure: Left Heart Cath and Coronary Angiography;  Surgeon: Troy Sine, MD;  Location: [facility];  Service: Cardiovascular;  Laterality: N/A;   CARDIAC CATHETERIZATION N/A 11/09/2015   Procedure: Coronary Stent Intervention;  Surgeon: Sherren Mocha,  MD;  Location: [facility];  Service: Cardiovascular;  Laterality: N/A;   CARDIAC CATHETERIZATION N/A 11/09/2015   Procedure: Left Heart Cath and Coronary Angiography;  Surgeon: Sherren Mocha, MD;  Location: [facility];  Service: Cardiovascular;  Laterality: N/A;   CARDIAC CATHETERIZATION N/A 06/29/2016   Procedure: Left Heart  Cath and Coronary Angiography;  Surgeon: Nelva Bush, MD;  Location: [facility];  Service: Cardiovascular;  Laterality: N/A;   CARDIAC CATHETERIZATION N/A 06/29/2016   Procedure: Intravascular Pressure Wire/FFR Study;  Surgeon: Nelva Bush, MD;  Location: [facility];  Service: Cardiovascular;  Laterality: N/A;   CARDIAC CATHETERIZATION N/A 06/29/2016   Procedure: Coronary Balloon Angioplasty;  Surgeon: Nelva Bush, MD;  Location: [facility];  Service: Cardiovascular;  Laterality: N/A;   COLON RESECTION  12/19/2011   Procedure: COLON RESECTION LAPAROSCOPIC;  Surgeon: Rolm Bookbinder, MD;  Location: [facility];  Service: General;  Laterality: N/A;  laparoscopic hand assisted partial colon resection   COLON SURGERY     COLONOSCOPY     CORONARY ANGIOPLASTY WITH STENT PLACEMENT  ~ 2007   1 stent   CORONARY ARTERY BYPASS GRAFT  2007   "CABG X2"   DILATION AND CURETTAGE OF UTERUS  1973   EP IMPLANTABLE DEVICE N/A 03/25/2015   MDT Advisa DR pacemaker implanted by Dr Rayann Heman for transient complete heart block and syncope   INSERT / REPLACE / REMOVE PACEMAKER     LEFT HEART CATH AND CORS/GRAFTS ANGIOGRAPHY N/A 02/21/2018   Procedure: LEFT HEART CATH AND CORS/GRAFTS ANGIOGRAPHY;  Surgeon: Lorretta Harp, MD;  Location: [facility];  Service: Cardiovascular;  Laterality: N/A;   LEFT HEART CATH AND CORS/GRAFTS ANGIOGRAPHY N/A 03/08/2021   Procedure: LEFT HEART CATH AND CORS/GRAFTS ANGIOGRAPHY;  Surgeon: [name], Peter M, MD;  Location: [facility];  Service: Cardiovascular;  Laterality: N/A;   PORT-A-CATH REMOVAL N/A 06/12/2013   Procedure: REMOVAL PORT-A-CATH;  Surgeon: Rolm Bookbinder, MD;  Location: [facility];  Service: General;  Laterality: N/A;   PORTACATH PLACEMENT  06/04/2012   Procedure: INSERTION PORT-A-CATH;  Surgeon: Rolm Bookbinder, MD;  Location: [facility];  Service: General;  Laterality: N/A;  Insertion of port-a-cath    POSTERIOR  LAMINECTOMY / [place]      Prior to Admission medications   Medication Sig Start Date End Date Taking? Authorizing Provider  acetaminophen (TYLENOL) 500 MG tablet Take 500-1,000 mg by mouth daily as needed for mild pain or headache.   Yes [provider]  aspirin 81 MG chewable tablet Chew 1 tablet (81 mg total) by mouth daily. 06/30/16  Yes Arbutus Leas, NP  Azelastine HCl 0.15 % SOLN Place 1 spray into both nostrils daily as needed (seasonal allergies).  12/22/14  Yes [provider]  Bempedoic Acid-Ezetimibe (NEXLIZET) 180-10 MG TABS Take 180 mg by mouth daily. 12/29/20  Yes [name], Peter M, MD  carbamazepine (TEGRETOL XR) 200 MG 12 hr tablet Take 3 tablets (600 mg total) by mouth 2 (two) times daily. 01/19/21  Yes Kathrynn Ducking, MD  carvedilol (COREG) 3.125 MG tablet Take 1 tablet (3.125 mg total) by mouth 2 (two) times daily. 09/10/20 09/05/21 Yes [name], Peter M, MD  DULoxetine (CYMBALTA) 60 MG capsule Take 60 mg by mouth daily.    Yes [provider]  ethosuximide (ZARONTIN) 250 MG capsule TAKE 2 CAPSULES IN THE MORNING, TAKE 2 CAPSULES AT NIGHT 01/19/21  Yes Suzzanne Cloud, NP  levothyroxine (SYNTHROID, LEVOTHROID) 50 MCG tablet Take 50 mcg by mouth daily.   Yes [provider]  memantine (NAMENDA) 10 MG tablet Take 1 tablet (10 mg total) by mouth 2 (two) times daily. 08/17/20  Yes Suzzanne Cloud, NP  nitroGLYCERIN (NITROSTAT) 0.4 MG SL tablet Place 1 tablet (0.4 mg total) under the tongue every 5 (five) minutes as needed. For chest pain. 09/10/20  Yes [name], Peter M, MD  prasugrel (EFFIENT) 10 MG TABS tablet Take 1 tablet (10 mg total) by mouth daily. 03/10/21  Yes Jerline Pain, MD  tiZANidine (ZANAFLEX) 2 MG tablet Take by mouth every 6 (six) hours as needed for muscle spasms.   Yes [provider]    Current Facility-Administered Medications  Medication Dose Route Frequency Provider Last Rate  Last Admin   0.9 %  sodium chloride infusion   Intravenous Continuous Lequita Halt, MD 125 mL/hr at 03/14/21 0133 New Bag at 03/14/21 0133   acetaminophen (TYLENOL) tablet 500-1,000 mg  500-1,000 mg Oral Daily PRN Wynetta Fines T, MD       aspirin chewable tablet 81 mg  81 mg Oral Daily Roosevelt Locks, Ping T, MD   81 mg at 03/13/21 1724   azelastine (ASTELIN) 0.1 % nasal spray 1 spray  1 spray Each Nare Daily PRN Heloise Purpura, RPH       carbamazepine (TEGRETOL XR) 12 hr tablet 600 mg  600 mg Oral BID Wynetta Fines T, MD   600 mg at 03/13/21 1726   DULoxetine (CYMBALTA) DR capsule 60 mg  60 mg Oral Daily Wynetta Fines T, MD   60 mg at 03/13/21 1724   ethosuximide (ZARONTIN) capsule 500 mg  500 mg Oral BID Heloise Purpura, RPH   500 mg at 03/14/21 0215   ezetimibe (ZETIA) tablet 10 mg  10 mg Oral Daily Heloise Purpura, RPH   10 mg at 03/13/21 1724   hydrALAZINE (APRESOLINE) injection 5 mg  5 mg Intravenous Q6H PRN Wynetta Fines T, MD       levothyroxine (SYNTHROID) tablet 50 mcg  50 mcg Oral Daily Wynetta Fines T, MD   50 mcg at 03/14/21 0538   memantine (NAMENDA) tablet 10 mg  10 mg Oral BID Wynetta Fines T, MD   10 mg at 03/13/21 1726   ondansetron (ZOFRAN) tablet 4 mg  4 mg Oral Q6H PRN Wynetta Fines T, MD       Or   ondansetron [facility]) injection 4 mg  4 mg Intravenous Q6H PRN Wynetta Fines T, MD       pantoprazole (PROTONIX) injection 40 mg  40 mg Intravenous Q12H Wynetta Fines T, MD   40 mg at 03/13/21 2145   prasugrel (EFFIENT) tablet 10 mg  10 mg Oral Daily Wynetta Fines T, MD   10 mg at 03/13/21 1724   tiZANidine (ZANAFLEX) tablet 2 mg  2 mg Oral Q6H PRN Lequita Halt, MD       Current Outpatient Medications  Medication Sig Dispense Refill   acetaminophen (TYLENOL) 500 MG tablet Take 500-1,000 mg by mouth daily as needed for mild pain or headache.     aspirin 81 MG chewable tablet Chew 1 tablet (81 mg total) by mouth daily.     Azelastine HCl 0.15 % SOLN Place 1 spray into both nostrils daily as  needed (seasonal allergies).   12   Bempedoic Acid-Ezetimibe (NEXLIZET) 180-10 MG TABS Take 180 mg by mouth  daily. 90 tablet 3   carbamazepine (TEGRETOL XR) 200 MG 12 hr tablet Take 3 tablets (600 mg total) by mouth 2 (two) times daily. 180 tablet 11   carvedilol (COREG) 3.125 MG tablet Take 1 tablet (3.125 mg total) by mouth 2 (two) times daily. 180 tablet 3   DULoxetine (CYMBALTA) 60 MG capsule Take 60 mg by mouth daily.      ethosuximide (ZARONTIN) 250 MG capsule TAKE 2 CAPSULES IN THE MORNING, TAKE 2 CAPSULES AT NIGHT 360 capsule 3   levothyroxine (SYNTHROID, LEVOTHROID) 50 MCG tablet Take 50 mcg by mouth daily.     memantine (NAMENDA) 10 MG tablet Take 1 tablet (10 mg total) by mouth 2 (two) times daily. 180 tablet 3   nitroGLYCERIN (NITROSTAT) 0.4 MG SL tablet Place 1 tablet (0.4 mg total) under the tongue every 5 (five) minutes as needed. For chest pain. 25 tablet 11   prasugrel (EFFIENT) 10 MG TABS tablet Take 1 tablet (10 mg total) by mouth daily. 90 tablet 3   tiZANidine (ZANAFLEX) 2 MG tablet Take by mouth every 6 (six) hours as needed for muscle spasms.      Allergies as of 03/13/2021 - Review Complete 03/13/2021  Allergen Reaction Noted   Aspirin Other (See Comments) 12/30/2010   Capecitabine  11/10/2020   Crestor [rosuvastatin]  12/28/2020   Erythromycin Nausea Only 12/23/2010   Fluticasone  11/10/2020   Lipitor [atorvastatin]  12/28/2020   Lisinopril Cough 12/23/2010   Niacin and related Other (See Comments) 12/30/2010   Statins Other (See Comments) 12/23/2010   Sulfa drugs cross reactors Swelling 12/23/2010   Sulfamethoxazole  11/10/2020   Tetracyclines & related Nausea Only 12/23/2010   Penicillins Nausea Only and Rash 12/23/2010   Plavix [clopidogrel bisulfate] Rash 02/20/2018    Family History  Problem Relation Age of Onset   Heart attack Father    Heart disease Father    Heart attack Brother 41   Cancer Mother        lung   Stroke Neg Hx     Social History    Socioeconomic History   Marital status: Married    Spouse name: Not on file   Number of children: 2   Years of education: 12   Highest education level: Not on file  Occupational History   Occupation: office work    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   Patient is right handed.   Patient has a high school education with some college education.   Patient drinks 2 cups daily.   Social Determinants of Health   Financial Resource Strain: Not on file  Food Insecurity: Not on file  Transportation Needs: Not on file  Physical Activity: Not on file  Stress: Not  on file  Social Connections: Not on file  Intimate Partner Violence: Not on file    Review of Systems: Positive for: GI: Described in detail in HPI.    Gen: fatigue, weakness, malaise, involuntary weight loss,Denies any fever, chills, rigors, night sweats, anorexia  and sleep disorder CV: syncope,Denies chest pain, angina, palpitations,  orthopnea, PND, peripheral edema, and claudication. Resp: Denies dyspnea, cough, sputum, wheezing, coughing up blood. GU : Denies urinary burning, blood in urine, urinary frequency, urinary hesitancy, nocturnal urination, and urinary incontinence. MS: Denies joint pain or swelling.  Denies muscle weakness, cramps, atrophy.  Derm: Denies rash, itching, oral ulcerations, hives, unhealing ulcers.  Psych: Denies  depression, anxiety, memory loss, suicidal ideation, hallucinations,  and confusion. Heme: Denies bruising and enlarged lymph nodes. Neuro:  Denies any headaches, paresthesias. Endo:  dizziness, Denies any problems with DM, thyroid, adrenal function.  Physical Exam: Vital signs in last 24 hours: Temp:  [97.8 F (36.6 C)-99.1 F (37.3 C)] 98.9 F (37.2 C) (09/12 0610) Pulse Rate:  [89-135] 100 (09/12 0745) Resp:  [12-29] 22 (09/12 0745) BP: (82-173)/(53-123) 113/69 (09/12 0745) SpO2:  [88 %-100 %] 100 % (09/12 0745) Weight:  [76.2 kg] 76.2 kg (09/12 0133)    General:   Alert,  Well-developed, well-nourished, pleasant and cooperative in NAD Head:  Normocephalic and atraumatic. Eyes:  Sclera clear, no icterus.   Mild pallor Ears:  Normal auditory acuity. Nose:  No deformity, discharge,  or lesions. Mouth:  No deformity or lesions.  Oropharynx pink & moist. Neck:  Supple; no masses or thyromegaly. Lungs:  Clear throughout to auscultation.   No wheezes, crackles, or rhonchi. No acute distress. Heart: Slightly tachycardic Extremities:  Without clubbing or edema. Neurologic:  Alert and  oriented x4;  grossly normal neurologically. Skin:  Intact without significant lesions or rashes. Psych:  Alert and cooperative. Normal mood and affect. Abdomen:  Soft, nontender and nondistended. No masses, hepatosplenomegaly or hernias noted. Normal bowel sounds, without guarding, and without rebound, well-healed surgical incisions     Lab Results: Recent Labs    03/13/21 0927 03/13/21 1013 03/13/21 1657 03/13/21 2300 03/14/21 0338  WBC 10.9*  --   --   --   --   HGB 12.0   < > 10.2* 7.8* 7.7*  HCT 35.9*   < > 29.8* 23.3* 22.6*  PLT 377  --   --   --   --    < > = values in this interval not displayed.   BMET Recent Labs    03/13/21 0927 03/13/21 1013  NA 134* 135  K 4.9 4.8  CL 103 103  CO2 23  --   GLUCOSE 127* 122*  BUN 39* 42*  CREATININE 0.68 0.60  CALCIUM 8.4*  --     LFT Recent Labs    03/13/21 0927  PROT 6.5  ALBUMIN 3.3*  AST 17  ALT 14  ALKPHOS 43  BILITOT 0.5   PT/INR Recent Labs    03/13/21 0927  LABPROT 13.7  INR 1.1    Studies/Results: DG Chest 1 View  Result Date: 03/13/2021 CLINICAL DATA:  Patient complains of brown emesis EXAM: CHEST  1 VIEW COMPARISON:  March 08, 2021, February 23, 2018 FINDINGS: The heart size and mediastinal contours are stable. Cardiac pacemaker is unchanged. Both lungs are clear. Chronic change at left hemidiaphragm is stable compared to 2019. The visualized skeletal structures are unremarkable. IMPRESSION: No active disease. Electronically Signed   By: Mallie Darting.D.  On: 03/13/2021 10:31    Impression: Coffee-ground emesis Hypotensive at home, on IV fluids at 125 mL/h Remains slightly tachycardic, no longer hypotensive Baseline hemoglobin around 12, progressive H&H monitoring shows declining trend 11.9/10.2/7.8/7.7 Elevated BUN/creatinine ratio 42/0.6 compatible with upper GI bleeding FOBT positive  Plan: Due to high risk of stent thrombosis and MI with discontinuation of dual antiplatelet therapy, as per cardiology patient needs to be continued on prasugrel and aspirin.  Although patient has not had further episodes of coffee-ground emesis, and drop in hemoglobin is possibly hemodilutional with IV fluid resuscitation, we will plan a diagnostic endoscopy today morning.  Intervention during endoscopy will be limited due to ongoing treatment with prasugrel and aspirin.  The intention is to identify the source of bleeding, determine risk of re-bleeding, and treat medically if possible.  The risks and the benefits of the procedure were discussed in details with the patient and her husband at bedside in the ED.  They verbalize understanding and consent.   LOS: 1 day   Ronnette Juniper, MD  03/14/2021, 8:01 AM

## 2021-03-14 NOTE — Interval H&P Note (Signed)
History and Physical Interval Note: 69/female with coffee ground emesis on Prasugrel and ASA for a diagnostic EGD with propofol.  03/14/2021 10:02 AM  Rachel Clark  has presented today for EGD, with the diagnosis of coffee ground emesis, anemia, on prasugrel and asa, recent PCI and stent on 03/08/21.  The various methods of treatment have been discussed with the patient and family. After consideration of risks, benefits and other options for treatment, the patient has consented to  Procedure(s): ESOPHAGOGASTRODUODENOSCOPY (EGD) WITH PROPOFOL (N/A) as a surgical intervention.  The patient's history has been reviewed, patient examined, no change in status, stable for surgery.  I have reviewed the patient's chart and labs.  Questions were answered to the patient's satisfaction.     Rachel Clark

## 2021-03-14 NOTE — ED Notes (Signed)
Attempted to call report. Per Astronomer Rn had not assigned pt room yet.

## 2021-03-14 NOTE — Anesthesia Postprocedure Evaluation (Signed)
Anesthesia Post Note  Patient: Rachel Clark  Procedure(s) Performed: ESOPHAGOGASTRODUODENOSCOPY (EGD) WITH PROPOFOL SCLEROTHERAPY HEMOSTASIS CLIP PLACEMENT     Patient location during evaluation: PACU Anesthesia Type: MAC Level of consciousness: awake and alert Pain management: pain level controlled Vital Signs Assessment: post-procedure vital signs reviewed and stable Respiratory status: spontaneous breathing, nonlabored ventilation, respiratory function stable and patient connected to nasal cannula oxygen Cardiovascular status: stable and blood pressure returned to baseline Postop Assessment: no apparent nausea or vomiting Anesthetic complications: no   No notable events documented.  Last Vitals:  Vitals:   03/14/21 1120 03/14/21 1125  BP:  (!) 167/65  Pulse: (!) 102 99  Resp: 19 (!) 23  Temp:    SpO2: 100% 100%    Last Pain:  Vitals:   03/14/21 1132  TempSrc:   PainSc: 6                  Tiajuana Amass

## 2021-03-14 NOTE — Anesthesia Preprocedure Evaluation (Signed)
Anesthesia Evaluation  Patient identified by MRN, date of birth, ID band Patient awake    Reviewed: Allergy & Precautions, NPO status , Patient's Chart, lab work & pertinent test results  Airway Mallampati: II  TM Distance: >3 FB     Dental   Pulmonary former smoker,    Pulmonary exam normal        Cardiovascular hypertension, Pt. on medications + angina + CAD and + Cardiac Stents  Normal cardiovascular exam+ pacemaker      Neuro/Psych Seizures -, Well Controlled,   Neuromuscular disease    GI/Hepatic Neg liver ROS, PUD, GERD  ,  Endo/Other  Hypothyroidism   Renal/GU negative Renal ROS     Musculoskeletal  (+) Arthritis , Fibromyalgia -  Abdominal   Peds  Hematology  (+) Blood dyscrasia, anemia ,   Anesthesia Other Findings   Reproductive/Obstetrics                             Anesthesia Physical Anesthesia Plan  ASA: 4  Anesthesia Plan: MAC   Post-op Pain Management:    Induction:   PONV Risk Score and Plan: 2 and Treatment may vary due to age or medical condition, Propofol infusion and Ondansetron  Airway Management Planned: Natural Airway and Nasal Cannula  Additional Equipment:   Intra-op Plan:   Post-operative Plan:   Informed Consent: I have reviewed the patients History and Physical, chart, labs and discussed the procedure including the risks, benefits and alternatives for the proposed anesthesia with the patient or authorized representative who has indicated his/her understanding and acceptance.       Plan Discussed with:   Anesthesia Plan Comments:         Anesthesia Quick Evaluation

## 2021-03-14 NOTE — Progress Notes (Signed)
Notified by RN that Hgb this am was 7.7. Was 11.9 yesterday am. No hemetemesis in Er overnight.   Transfuse one unit PRBC. Monitor Hgb/hct.

## 2021-03-14 NOTE — ED Notes (Signed)
Call placed to lab to follow up on repeat H&H that was drawn at 3:43AM and is showing to be in process. New ordered required, placed.

## 2021-03-14 NOTE — Transfer of Care (Signed)
Immediate Anesthesia Transfer of Care Note  Patient: Rachel Clark  Procedure(s) Performed: ESOPHAGOGASTRODUODENOSCOPY (EGD) WITH PROPOFOL SCLEROTHERAPY HEMOSTASIS CLIP PLACEMENT  Patient Location: Endoscopy Unit  Anesthesia Type:MAC  Level of Consciousness: sedated  Airway & Oxygen Therapy: Patient connected to nasal cannula oxygen  Post-op Assessment: Post -op Vital signs reviewed and stable  Post vital signs: stable  Last Vitals:  Vitals Value Taken Time  BP    Temp    Pulse    Resp    SpO2      Last Pain:  Vitals:   03/14/21 1002  TempSrc:   PainSc: 0-No pain         Complications: No notable events documented.

## 2021-03-14 NOTE — ED Notes (Signed)
Patient does not eat beef

## 2021-03-14 NOTE — ED Notes (Signed)
Attempted to call report. Per Astronomer Rn had not assigned room

## 2021-03-14 NOTE — ED Notes (Signed)
Overnight MD coverage notified of patient's sinus tach as she intermittently gets briefly into the 130s. Around 1am she did get to 150 briefly but she was also anxious at this time regarding the location of her husband. After reassuring her she easily relaxes. H&H was drawn and will notify MD if Hbg drops.

## 2021-03-15 ENCOUNTER — Encounter (HOSPITAL_COMMUNITY): Payer: Self-pay | Admitting: Gastroenterology

## 2021-03-15 ENCOUNTER — Inpatient Hospital Stay (HOSPITAL_COMMUNITY): Payer: Medicare Other

## 2021-03-15 DIAGNOSIS — K922 Gastrointestinal hemorrhage, unspecified: Secondary | ICD-10-CM | POA: Diagnosis not present

## 2021-03-15 LAB — CBC
HCT: 18.5 % — ABNORMAL LOW (ref 36.0–46.0)
Hemoglobin: 6.5 g/dL — CL (ref 12.0–15.0)
MCH: 31.3 pg (ref 26.0–34.0)
MCHC: 35.1 g/dL (ref 30.0–36.0)
MCV: 88.9 fL (ref 80.0–100.0)
Platelets: 223 10*3/uL (ref 150–400)
RBC: 2.08 MIL/uL — ABNORMAL LOW (ref 3.87–5.11)
RDW: 13.8 % (ref 11.5–15.5)
WBC: 10.9 10*3/uL — ABNORMAL HIGH (ref 4.0–10.5)
nRBC: 0 % (ref 0.0–0.2)

## 2021-03-15 LAB — BASIC METABOLIC PANEL
Anion gap: 7 (ref 5–15)
BUN: 25 mg/dL — ABNORMAL HIGH (ref 8–23)
CO2: 22 mmol/L (ref 22–32)
Calcium: 8 mg/dL — ABNORMAL LOW (ref 8.9–10.3)
Chloride: 104 mmol/L (ref 98–111)
Creatinine, Ser: 0.62 mg/dL (ref 0.44–1.00)
GFR, Estimated: >60 mL/min (ref >60)
Glucose, Bld: 113 mg/dL — ABNORMAL HIGH (ref 70–99)
Potassium: 3.3 mmol/L — ABNORMAL LOW (ref 3.5–5.1)
Sodium: 133 mmol/L — ABNORMAL LOW (ref 135–145)

## 2021-03-15 LAB — PREPARE RBC (CROSSMATCH)

## 2021-03-15 LAB — HEMOGLOBIN AND HEMATOCRIT, BLOOD
HCT: 26.1 % — ABNORMAL LOW (ref 36.0–46.0)
HCT: 24.1 % — ABNORMAL LOW (ref 36.0–46.0)
Hemoglobin: 9.4 g/dL — ABNORMAL LOW (ref 12.0–15.0)
Hemoglobin: 8.6 g/dL — ABNORMAL LOW (ref 12.0–15.0)

## 2021-03-15 MED ORDER — FUROSEMIDE 10 MG/ML IJ SOLN
20.0000 mg | Freq: Once | INTRAMUSCULAR | Status: AC
  Administered 2021-03-15: 20 mg via INTRAVENOUS
  Filled 2021-03-15: qty 2

## 2021-03-15 MED ORDER — TECHNETIUM TC 99M-LABELED RED BLOOD CELLS IV KIT
24.7000 | PACK | Freq: Once | INTRAVENOUS | Status: AC | PRN
  Administered 2021-03-15: 24.7 via INTRAVENOUS

## 2021-03-15 MED ORDER — POTASSIUM CHLORIDE CRYS ER 20 MEQ PO TBCR
40.0000 meq | EXTENDED_RELEASE_TABLET | Freq: Once | ORAL | Status: AC
  Administered 2021-03-15: 40 meq via ORAL
  Filled 2021-03-15: qty 2

## 2021-03-15 MED ORDER — SODIUM CHLORIDE 0.9% IV SOLUTION: Freq: Once | INTRAVENOUS | Status: AC

## 2021-03-15 MED ORDER — ACETAMINOPHEN 325 MG PO TABS
650.0000 mg | ORAL_TABLET | Freq: Once | ORAL | Status: AC
  Administered 2021-03-15: 650 mg via ORAL
  Filled 2021-03-15: qty 2

## 2021-03-15 MED ORDER — DIPHENHYDRAMINE HCL 50 MG/ML IJ SOLN
25.0000 mg | Freq: Once | INTRAMUSCULAR | Status: AC
  Administered 2021-03-15: 25 mg via INTRAVENOUS
  Filled 2021-03-15: qty 1

## 2021-03-15 MED ORDER — LORAZEPAM 2 MG/ML IJ SOLN
1.0000 mg | INTRAMUSCULAR | Status: DC | PRN
Start: 2021-03-15 — End: 2021-03-17

## 2021-03-15 NOTE — Progress Notes (Signed)
PROGRESS NOTE    Rachel Clark  [id number] DOB: [birth year] DOA: 03/13/2021 PCP: Harlan Stains, MD    Brief Narrative: 70 year old female with CAD recent  NSTEMI with Stent x3, on ASA, Effient, HTN, HLD, Seizure Disorder/Memory Loss, Colon Cancer S/P resection Presents with Coffee-Ground Material Vomiting x 3 days.  Recently she has been experiencing orthostatic hypotension and syncope and was advised to reduce sodium in her diet and drink Gatorade. Previous Colonoscopy 04/2018: 6 tubular adenomas and one sessile serrated adenoma removed, repeat recommended in 3 years Colonoscopy 2014: Normal-appearing anastomosis, recommended repeat in 3 years Colonoscopy 2013: Adenocarcinoma of transverse colon Laparoscopic transverse colectomy in 2013.  In ED BP-borderline given-received  NNS BOLUS, hba 12.1, 4 days PTA, GI and cardio was consulted and admitted. 9/12- Patient underwent EGD that showed visual blood vessels versus adherent clot at antrum status post epinephrine injection and Endo Clip and placed on PPI drip   Subjective: Overnight hemoglobin down to 7.5 g> 6.5 gm She is getting her second unit blood transfusion.  Complaints of dizziness but no chest pain no bowel movement  Assessment & Plan:  Coffee-ground emesis Upper GI bleeding Acute blood loss anemia due to GI bleed: baseline hb 12 gm 9/12-EGD-that revealed LA grade a esophagitis, coffee-ground, or blood in the greater curvature distal vessels versus adherent clot at antrum treated with epinephrine injection and endoclips placement.  Continue on clear liquid diet, PPI drip.  2 units PRBC ordered as hemoglobin has dropped to 6.5 g.  Monitor serial H&H will likely need additional transfusion.  Unfortunately She remains on aspirin and Effient due to recent stent-at risk of in-stent thrombosis and will need to continue blood transfusion and supportive care for GI bleed.Discussed with GI and bleeding scan has been ordered Recent  Labs  Lab 03/13/21 2300 03/14/21 0338 03/14/21 1211 03/14/21 2041 03/15/21 0313  HGB 7.8* 7.7* 8.0* 7.5* 6.5*  HCT 23.3* 22.6* 24.1* 23.0* 18.5*     CAD with severe three-vessel obstructive CAD patent LIMA to LAD, known occlusion of SVG to RCA patent stent in the RCA -had PCI of the ramus intermedius with DES x3 on 03/08/2021 cardiology seeing and will need to continue ASA and Effient at home HTN-recent hypotension at home.  Coreg on hold HLD: On Zetia  Hypokalemia monitor and replace Recent Labs  Lab 03/08/21 1655 03/09/21 0218 03/13/21 0927 03/13/21 1013 03/15/21 0313  K 3.0* 3.9 4.9 4.8 3.3*     Seizure Disorder/Memory Loss: Continue home ethosuximide  Colon Cancer S/P resection   Hypothyroidism on Synthroid  Diet Order             Diet clear liquid Room service appropriate? Yes; Fluid consistency: Thin  Diet effective now                  Patient's Body mass index is 24.81 kg/m.  DVT prophylaxis: SCDs Start: 03/13/21 1500 Code Status:   Code Status: Full Code  Family Communication: plan of care discussed with patient  and husband at bedside. Status is: Inpatient Remains inpatient appropriate because:IV treatments appropriate due to intensity of illness or inability to take PO and Inpatient level of care appropriate due to severity of illness Dispo: The patient is from: Home              Anticipated d/c is to: Home              Patient currently is not medically stable to d/c.   Difficult to place patient  No  Unresulted Labs (From admission, onward)     Start     Ordered   03/15/21 [id number]  Basic metabolic panel  Daily,   R     Question:  Specimen collection method  Answer:  Lab=Lab collect   03/14/21 0827   03/15/21 0500  CBC  Daily,   R     Question:  Specimen collection method  Answer:  Lab=Lab collect   03/14/21 0827   03/14/21 2100  Hemoglobin and hematocrit, blood  Now then every 12 hours,   R (with TIMED occurrences)     Question:  Specimen  collection method  Answer:  Lab=Lab collect   03/14/21 1338           Medications reviewed:  Scheduled Meds:  aspirin  81 mg Oral Daily   carbamazepine  600 mg Oral BID   DULoxetine  60 mg Oral Daily   ethosuximide  500 mg Oral BID   ezetimibe  10 mg Oral Daily   levothyroxine  50 mcg Oral Daily   memantine  10 mg Oral BID   [START ON 03/17/2021] pantoprazole  40 mg Intravenous Q12H   prasugrel  10 mg Oral Daily   Continuous Infusions:  pantoprazole 8 mg/hr (03/15/21 0016)   Consultants:see note  Procedures:see note Antimicrobials: Anti-infectives (From admission, onward)    None      Culture/Microbiology    Component Value Date/Time   SDES URINE, CLEAN CATCH 05/07/2012 1325   SPECREQUEST NONE 05/07/2012 1325   CULT ESCHERICHIA COLI 05/07/2012 1325   REPTSTATUS 05/09/2012 FINAL 05/07/2012 1325    Other culture-see note  Objective: Vitals: Today's Vitals   03/15/21 0636 03/15/21 0637 03/15/21 0641 03/15/21 0749  BP:   (!) 102/49 (!) 115/57  Pulse: 91 91 98 87  Resp:   18 19  Temp:   97.8 F (36.6 C) 97.6 F (36.4 C)  TempSrc:   Oral Oral  SpO2: 100% 98% 100% 100%  Weight:      Height:      PainSc:        Intake/Output Summary (Last 24 hours) at 03/15/2021 0802 Last data filed at 03/15/2021 0641 Gross per 24 hour  Intake 448.87 ml  Output --  Net 448.87 ml    Filed Weights   03/14/21 0133  Weight: 76.2 kg   Weight change:   Intake/Output from previous day: 09/12 0701 - 09/13 0700 In: 448.9 [I.V.:162.9; Blood:286] Out: -  Intake/Output this shift: No intake/output data recorded. Filed Weights   03/14/21 0133  Weight: 76.2 kg   Examination: General exam: AAOx 3,pleasant HEENT:Oral mucosa moist, Ear/Nose WNL grossly, dentition normal. Respiratory system: bilaterally clear, no use of accessory muscle Cardiovascular system: S1 & S2 +, No JVD,. Gastrointestinal system: Abdomen soft,NT,ND, BS+ Nervous System:Alert, awake, moving extremities  and grossly nonfocal Extremities: no edema, distal peripheral pulses palpable.  Skin: No rashes,no icterus. MSK: Normal muscle bulk,tone, power   Data Reviewed: I have personally reviewed following labs and imaging studies CBC: Recent Labs  Lab 03/08/21 1155 03/08/21 1655 03/09/21 0218 03/13/21 0927 03/13/21 1013 03/13/21 2300 03/14/21 0338 03/14/21 1211 03/14/21 2041 03/15/21 0313  WBC 6.6  --  8.5 10.9*  --   --   --   --   --  10.9*  NEUTROABS  --   --   --  8.5*  --   --   --   --   --   --   HGB 13.0   < >  12.1 12.0   < > 7.8* 7.7* 8.0* 7.5* 6.5*  HCT 38.6   < > 35.4* 35.9*   < > 23.3* 22.6* 24.1* 23.0* 18.5*  MCV 90.2  --  88.3 90.9  --   --   --   --   --  88.9  PLT 368  --  319 377  --   --   --   --   --  223   < > = values in this interval not displayed.    Basic Metabolic Panel: Recent Labs  Lab 03/08/21 1155 03/08/21 1655 03/09/21 0218 03/13/21 0927 03/13/21 1013 03/15/21 0313  NA 130* 105* 132* 134* 135 133*  K 4.0 3.0* 3.9 4.9 4.8 3.3*  CL 98  --  99 103 103 104  CO2 25  --  26 23  --  22  GLUCOSE 122*  --  102* 127* 122* 113*  BUN 14  --  12 39* 42* 25*  CREATININE 0.71  --  0.69 0.68 0.60 0.62  CALCIUM 8.6*  --  8.7* 8.4*  --  8.0*    GFR: Estimated Creatinine Clearance: 67.4 mL/min (by C-G formula based on SCr of 0.62 mg/dL). Liver Function Tests: Recent Labs  Lab 03/13/21 0927  AST 17  ALT 14  ALKPHOS 43  BILITOT 0.5  PROT 6.5  ALBUMIN 3.3*    Recent Labs  Lab 03/13/21 0927  LIPASE 22    No results for input(s): AMMONIA in the last 168 hours. Coagulation Profile: Recent Labs  Lab 03/13/21 0927  INR 1.1    Cardiac Enzymes: No results for input(s): CKTOTAL, CKMB, CKMBINDEX, TROPONINI in the last 168 hours. BNP (last 3 results) No results for input(s): PROBNP in the last 8760 hours. HbA1C: No results for input(s): HGBA1C in the last 72 hours. CBG: No results for input(s): GLUCAP in the last 168 hours. Lipid Profile: No  results for input(s): CHOL, HDL, LDLCALC, TRIG, CHOLHDL, LDLDIRECT in the last 72 hours. Thyroid Function Tests: No results for input(s): TSH, T4TOTAL, FREET4, T3FREE, THYROIDAB in the last 72 hours. Anemia Panel: No results for input(s): VITAMINB12, FOLATE, FERRITIN, TIBC, IRON, RETICCTPCT in the last 72 hours. Sepsis Labs: Recent Labs  Lab 03/13/21 0958  LATICACIDVEN 1.7     Recent Results (from the past 240 hour(s))  Resp Panel by RT-PCR (Flu A&B, Covid) Nasopharyngeal Swab     Status: None   Collection Time: 03/08/21  1:30 PM   Specimen: Nasopharyngeal Swab; Nasopharyngeal(NP) swabs in vial transport medium  Result Value Ref Range Status   SARS Coronavirus 2 by RT PCR NEGATIVE NEGATIVE Final    Comment: (NOTE) SARS-CoV-2 target nucleic acids are NOT DETECTED.  The SARS-CoV-2 RNA is generally detectable in upper respiratory specimens during the acute phase of infection. The lowest concentration of SARS-CoV-2 viral copies this assay can detect is 138 copies/mL. A negative result does not preclude SARS-Cov-2 infection and should not be used as the sole basis for treatment or other patient management decisions. A negative result may occur with  improper specimen collection/handling, submission of specimen other than nasopharyngeal swab, presence of viral mutation(s) within the areas targeted by this assay, and inadequate number of viral copies(<138 copies/mL). A negative result must be combined with clinical observations, patient history, and epidemiological information. The expected result is Negative.  Fact Sheet for Patients:  EntrepreneurPulse.com.au  Fact Sheet for Healthcare Providers:  IncredibleEmployment.be  This test is no t yet approved or cleared by the [place]  States FDA and  has been authorized for detection and/or diagnosis of SARS-CoV-2 by FDA under an Emergency Use Authorization (EUA). This EUA will remain  in effect  (meaning this test can be used) for the duration of the COVID-19 declaration under Section 564(b)(1) of the Act, 21 U.S.C.section 360bbb-3(b)(1), unless the authorization is terminated  or revoked sooner.       Influenza A by PCR NEGATIVE NEGATIVE Final   Influenza B by PCR NEGATIVE NEGATIVE Final    Comment: (NOTE) The Xpert Xpress SARS-CoV-2/FLU/RSV plus assay is intended as an aid in the diagnosis of influenza from Nasopharyngeal swab specimens and should not be used as a sole basis for treatment. Nasal washings and aspirates are unacceptable for Xpert Xpress SARS-CoV-2/FLU/RSV testing.  Fact Sheet for Patients: EntrepreneurPulse.com.au  Fact Sheet for Healthcare Providers: IncredibleEmployment.be  This test is not yet approved or cleared by the [place] FDA and has been authorized for detection and/or diagnosis of SARS-CoV-2 by FDA under an Emergency Use Authorization (EUA). This EUA will remain in effect (meaning this test can be used) for the duration of the COVID-19 declaration under Section 564(b)(1) of the Act, 21 U.S.C. section 360bbb-3(b)(1), unless the authorization is terminated or revoked.  Performed at [facility], [place] [address] [place], [place] [address]   SARS CORONAVIRUS 2 (TAT 6-24 HRS) Nasopharyngeal Nasopharyngeal Swab     Status: None   Collection Time: 03/14/21  4:57 AM   Specimen: Nasopharyngeal Swab  Result Value Ref Range Status   SARS Coronavirus 2 NEGATIVE NEGATIVE Final    Comment: (NOTE) SARS-CoV-2 target nucleic acids are NOT DETECTED.  The SARS-CoV-2 RNA is generally detectable in upper and lower respiratory specimens during the acute phase of infection. Negative results do not preclude SARS-CoV-2 infection, do not rule out co-infections with other pathogens, and should not be used as the sole basis for treatment or other patient management decisions. Negative results must be combined with  clinical observations, patient history, and epidemiological information. The expected result is Negative.  Fact Sheet for Patients: SugarRoll.be  Fact Sheet for Healthcare Providers: https://www.woods-mathews.com/  This test is not yet approved or cleared by the [place] FDA and  has been authorized for detection and/or diagnosis of SARS-CoV-2 by FDA under an Emergency Use Authorization (EUA). This EUA will remain  in effect (meaning this test can be used) for the duration of the COVID-19 declaration under Se ction 564(b)(1) of the Act, 21 U.S.C. section 360bbb-3(b)(1), unless the authorization is terminated or revoked sooner.  Performed at [facility], [place] [address], [place], [place] [address]       Radiology Studies: DG Chest 1 View  Result Date: 03/13/2021 CLINICAL DATA:  Patient complains of brown emesis EXAM: CHEST  1 VIEW COMPARISON:  March 08, 2021, February 23, 2018 FINDINGS: The heart size and mediastinal contours are stable. Cardiac pacemaker is unchanged. Both lungs are clear. Chronic change at left hemidiaphragm is stable compared to 2019. The visualized skeletal structures are unremarkable. IMPRESSION: No active disease. Electronically Signed   By: Abelardo Diesel M.D.   On: 03/13/2021 10:31     LOS: 2 days   Antonieta Pert, MD [facility]  03/15/2021, 8:02 AM

## 2021-03-15 NOTE — Progress Notes (Signed)
Notified by RN that pt Hgb fell to 7.5, Hct 23 this am.  Had EGD for upper GI bleed with hemostatic clamps placed. Had recent cardiac stents placed on 9/6.  Has dementia and sundowns per husband.  Pt woke up and was mildly confused as to surroundings. Has no complaints at this time.  No hematemesis per RN.  Pt answers all questions and follows commands.   CV-RRR Respiratory- Clear bilaterally. No rhonchi, wheezing.  Abdomen-Soft. Nontender, nondistended.   Transfuse two units PRBC. Goal Hgb is 10 with recent stent placement.  Monitor closely.

## 2021-03-15 NOTE — Progress Notes (Signed)
Subjective: Patient states she had 2 black stools yesterday after endoscopy. Noted to have drop in hemoglobin, has received an additional PRBC transfusion. Denies shortness of breath, chest pain or dizziness. Has not had any further coffee-ground emesis since his admission. No bowel movements overnight or today morning.  Objective: Vital signs in last 24 hours: Temp:  [97.6 F (36.4 C)-98.3 F (36.8 C)] 98.3 F (36.8 C) (09/13 0800) Pulse Rate:  [87-114] 88 (09/13 0800) Resp:  [14-25] 18 (09/13 0800) BP: (102-167)/(49-78) 120/59 (09/13 0800) SpO2:  [97 %-100 %] 100 % (09/13 0800) Weight change:  Last BM Date: 03/13/21  PE: Puffy face, prominent pallor GENERAL: Not in distress, able to speak in full sentences  ABDOMEN: Soft, nondistended, nontender, normoactive bowel sounds EXTREMITIES: No edema, no deformity  Lab Results: Results for orders placed or performed during the hospital encounter of 03/13/21 (from the past 48 hour(s))  Comprehensive metabolic panel     Status: Abnormal   Collection Time: 03/13/21  9:27 AM  Result Value Ref Range   Sodium 134 (L) 135 - 145 mmol/L   Potassium 4.9 3.5 - 5.1 mmol/L   Chloride 103 98 - 111 mmol/L   CO2 23 22 - 32 mmol/L   Glucose, Bld 127 (H) 70 - 99 mg/dL    Comment: Glucose reference range applies only to samples taken after fasting for at least 8 hours.   BUN 39 (H) 8 - 23 mg/dL   Creatinine, Ser 0.68 0.44 - 1.00 mg/dL   Calcium 8.4 (L) 8.9 - 10.3 mg/dL   Total Protein 6.5 6.5 - 8.1 g/dL   Albumin 3.3 (L) 3.5 - 5.0 g/dL   AST 17 15 - 41 U/L   ALT 14 0 - 44 U/L   Alkaline Phosphatase 43 38 - 126 U/L   Total Bilirubin 0.5 0.3 - 1.2 mg/dL   GFR, Estimated >60 >60 mL/min    Comment: (NOTE) Calculated using the CKD-EPI Creatinine Equation (2021)    Anion gap 8 5 - 15    Comment: Performed at [facility], [place] [address], [place], [place] [address]  CBC WITH DIFFERENTIAL     Status: Abnormal   Collection Time: 03/13/21   9:27 AM  Result Value Ref Range   WBC 10.9 (H) 4.0 - 10.5 K/uL   RBC 3.95 3.87 - 5.11 MIL/uL   Hemoglobin 12.0 12.0 - 15.0 g/dL   HCT 35.9 (L) 36.0 - 46.0 %   MCV 90.9 80.0 - 100.0 fL   MCH 30.4 26.0 - 34.0 pg   MCHC 33.4 30.0 - 36.0 g/dL   RDW 12.6 11.5 - 15.5 %   Platelets 377 150 - 400 K/uL    Comment: REPEATED TO VERIFY   nRBC 0.0 0.0 - 0.2 %   Neutrophils Relative % 78 %   Neutro Abs 8.5 (H) 1.7 - 7.7 K/uL   Lymphocytes Relative 15 %   Lymphs Abs 1.6 0.7 - 4.0 K/uL   Monocytes Relative 5 %   Monocytes Absolute 0.6 0.1 - 1.0 K/uL   Eosinophils Relative 1 %   Eosinophils Absolute 0.1 0.0 - 0.5 K/uL   Basophils Relative 0 %   Basophils Absolute 0.0 0.0 - 0.1 K/uL   Immature Granulocytes 1 %   Abs Immature Granulocytes 0.07 0.00 - 0.07 K/uL    Comment: Performed at [facility] [address] [place], [place] [address]  Protime-INR     Status: None   Collection Time: 03/13/21  9:27 AM  Result Value Ref Range   Prothrombin Time 13.7 11.4 - 15.2 seconds   INR 1.1 0.8 - 1.2    Comment: (NOTE) INR goal varies based on device and disease states. Performed at [facility], [place] [address], [place], [place] [address]   Lipase, blood     Status: None   Collection Time: 03/13/21  9:27 AM  Result Value Ref Range   Lipase 22 11 - 51 U/L    Comment: Performed at [facility], [place] [address] [place], [place] [address]  Type and screen [place]     Status: None (Preliminary result)   Collection Time: 03/13/21  9:58 AM  Result Value Ref Range   ABO/RH(D) O NEG    Antibody Screen NEG    Sample Expiration 03/16/2021,2359    Unit Number [id number]    Blood Component Type RBC LR PHER2    Unit division 00    Status of Unit ISSUED    Transfusion Status OK TO TRANSFUSE    Crossmatch Result Compatible    Unit Number [id number]    Blood Component Type RED CELLS,LR    Unit division 00    Status of Unit ISSUED    Transfusion Status OK TO  TRANSFUSE    Crossmatch Result      Compatible Performed at [facility], [place] [address], [place], [place] [address]    Unit Number [id number]    Blood Component Type RED CELLS,LR    Unit division 00    Status of Unit ISSUED    Transfusion Status OK TO TRANSFUSE    Crossmatch Result Compatible   Lactic acid, plasma     Status: None   Collection Time: 03/13/21  9:58 AM  Result Value Ref Range   Lactic Acid, Venous 1.7 0.5 - 1.9 mmol/L    Comment: Performed at [facility], [place] [address], [place], [place] [address]  I-Stat Chem 8, ED     Status: Abnormal   Collection Time: 03/13/21 10:13 AM  Result Value Ref Range   Sodium 135 135 - 145 mmol/L   Potassium 4.8 3.5 - 5.1 mmol/L   Chloride 103 98 - 111 mmol/L   BUN 42 (H) 8 - 23 mg/dL   Creatinine, Ser 0.60 0.44 - 1.00 mg/dL   Glucose, Bld 122 (H) 70 - 99 mg/dL    Comment: Glucose reference range applies only to samples taken after fasting for at least 8 hours.   Calcium, Ion 1.13 (L) 1.15 - 1.40 mmol/L   TCO2 24 22 - 32 mmol/L   Hemoglobin 11.9 (L) 12.0 - 15.0 g/dL   HCT 35.0 (L) 36.0 - 46.0 %  POC occult blood, ED     Status: Abnormal   Collection Time: 03/13/21 10:59 AM  Result Value Ref Range   Fecal Occult Bld POSITIVE (A) NEGATIVE  Hemoglobin and hematocrit, blood     Status: Abnormal   Collection Time: 03/13/21  4:57 PM  Result Value Ref Range   Hemoglobin 10.2 (L) 12.0 - 15.0 g/dL   HCT 29.8 (L) 36.0 - 46.0 %    Comment: Performed at [facility], [place] [address] [place], [place] [address]  Hemoglobin and hematocrit, blood     Status: Abnormal   Collection Time: 03/13/21 11:00 PM  Result Value Ref Range   Hemoglobin 7.8 (L) 12.0 - 15.0 g/dL    Comment: REPEATED TO VERIFY   HCT  23.3 (L) 36.0 - 46.0 %    Comment: Performed at [facility] [address], [place], [place] [address]  Hemoglobin and hematocrit, blood     Status: Abnormal   Collection Time: 03/14/21  3:38 AM  Result Value Ref  Range   Hemoglobin 7.7 (L) 12.0 - 15.0 g/dL   HCT 22.6 (L) 36.0 - 46.0 %    Comment: Performed at [facility], [place] [address] [place], [place] [address]  SARS CORONAVIRUS 2 (TAT 6-24 HRS) Nasopharyngeal Nasopharyngeal Swab     Status: None   Collection Time: 03/14/21  4:57 AM   Specimen: Nasopharyngeal Swab  Result Value Ref Range   SARS Coronavirus 2 NEGATIVE NEGATIVE    Comment: (NOTE) SARS-CoV-2 target nucleic acids are NOT DETECTED.  The SARS-CoV-2 RNA is generally detectable in upper and lower respiratory specimens during the acute phase of infection. Negative results do not preclude SARS-CoV-2 infection, do not rule out co-infections with other pathogens, and should not be used as the sole basis for treatment or other patient management decisions. Negative results must be combined with clinical observations, patient history, and epidemiological information. The expected result is Negative.  Fact Sheet for Patients: SugarRoll.be  Fact Sheet for Healthcare Providers: https://www.woods-mathews.com/  This test is not yet approved or cleared by the [place] FDA and  has been authorized for detection and/or diagnosis of SARS-CoV-2 by FDA under an Emergency Use Authorization (EUA). This EUA will remain  in effect (meaning this test can be used) for the duration of the COVID-19 declaration under Se ction 564(b)(1) of the Act, 21 U.S.C. section 360bbb-3(b)(1), unless the authorization is terminated or revoked sooner.  Performed at [facility], [place] [address] [place], [place] [address]   Prepare RBC (crossmatch)     Status: None   Collection Time: 03/14/21  5:04 AM  Result Value Ref Range   Order Confirmation      ORDER PROCESSED BY BLOOD BANK Performed at [facility], [place] [address] [place], [place] [address]   Hemoglobin and hematocrit, blood     Status: Abnormal   Collection Time: 03/14/21 12:11 PM   Result Value Ref Range   Hemoglobin 8.0 (L) 12.0 - 15.0 g/dL   HCT 24.1 (L) 36.0 - 46.0 %    Comment: Performed at [facility] [address] [place], [place] [address]  Hemoglobin and hematocrit, blood     Status: Abnormal   Collection Time: 03/14/21  8:41 PM  Result Value Ref Range   Hemoglobin 7.5 (L) 12.0 - 15.0 g/dL   HCT 23.0 (L) 36.0 - 46.0 %    Comment: Performed at [facility], [place] [address] [place], [place] [id number]  Basic metabolic panel     Status: Abnormal   Collection Time: 03/15/21  3:13 AM  Result Value Ref Range   Sodium 133 (L) 135 - 145 mmol/L   Potassium 3.3 (L) 3.5 - 5.1 mmol/L   Chloride 104 98 - 111 mmol/L   CO2 22 22 - 32 mmol/L   Glucose, Bld 113 (H) 70 - 99 mg/dL    Comment: Glucose reference range applies only to samples taken after fasting for at least 8 hours.   BUN 25 (H) 8 - 23 mg/dL   Creatinine, Ser 0.62 0.44 - 1.00 mg/dL   Calcium 8.0 (L) 8.9 - 10.3 mg/dL   GFR, Estimated >60 >60 mL/min    Comment: (NOTE) Calculated using  the CKD-EPI Creatinine Equation (2021)    Anion gap 7 5 - 15    Comment: Performed at [facility], [place] [address], [place], [place] [address]  CBC     Status: Abnormal   Collection Time: 03/15/21  3:13 AM  Result Value Ref Range   WBC 10.9 (H) 4.0 - 10.5 K/uL   RBC 2.08 (L) 3.87 - 5.11 MIL/uL   Hemoglobin 6.5 (LL) 12.0 - 15.0 g/dL    Comment: REPEATED TO VERIFY THIS CRITICAL RESULT HAS VERIFIED AND BEEN CALLED TO Y. CROSS RN BY ALICIA JOHNSON ON 09 13 2022 AT 0344, AND HAS BEEN READ BACK.     HCT 18.5 (L) 36.0 - 46.0 %   MCV 88.9 80.0 - 100.0 fL   MCH 31.3 26.0 - 34.0 pg   MCHC 35.1 30.0 - 36.0 g/dL   RDW 13.8 11.5 - 15.5 %   Platelets 223 150 - 400 K/uL   nRBC 0.0 0.0 - 0.2 %    Comment: Performed at [facility] [address], [place], [place] [address]  Prepare RBC (crossmatch)     Status: None   Collection Time: 03/15/21  3:13 AM  Result Value Ref Range   Order Confirmation       ORDER PROCESSED BY BLOOD BANK Performed at [facility], [place] [address], [place], [place] [address]     Studies/Results: DG Chest 1 View  Result Date: 03/13/2021 CLINICAL DATA:  Patient complains of brown emesis EXAM: CHEST  1 VIEW COMPARISON:  March 08, 2021, February 23, 2018 FINDINGS: The heart size and mediastinal contours are stable. Cardiac pacemaker is unchanged. Both lungs are clear. Chronic change at left hemidiaphragm is stable compared to 2019. The visualized skeletal structures are unremarkable. IMPRESSION: No active disease. Electronically Signed   By: Abelardo Diesel M.D.   On: 03/13/2021 10:31    Medications: I have reviewed the patient's current medications.  Assessment: Coffee-ground emesis and melena with endoscopy showing oozing antral lesion with adherent clot/visible vessel treated with epinephrine and Endo Clip placement.  Since patient had a drug-eluting stent placed on 03/08/2021, she has been continued on Effient and aspirin.  Noted to have a drop in hemoglobin to 6.5 today morning at 3 AM, and has received 1 unit PRBC transfusion.  Appears stable hemodynamically BUN trending down from 42-25  Plan: Due to newly placed drug-eluting stent, we are unable to hold dual antiplatelet therapy, and need to continue Effient and aspirin.  This is proving to be a challenge, as endoscopic intervention may not be effective with ongoing dual antiplatelet therapy.  Will order a bleeding scan, if positive recommend IR evaluation for possible embolization to control bleeding.  Patient has received 1 unit PRBC on 03/14/2021 and 1 unit on 03/15/2021, with the intention to keep hemoglobin around 10. Continue to monitor H and H and transfuse as needed.  Continue Protonix at '8mg'$ / hour IV drip for now.   Ronnette Juniper, MD 03/15/2021, 8:16 AM

## 2021-03-15 NOTE — Progress Notes (Signed)
Cardiology Progress Note  Patient ID: Rachel Clark MRN: [id number] DOB: 04/07/1951 Date of Encounter: 03/15/2021  Primary Cardiologist: Peter [name], MD  Subjective   Chief Complaint: None.  HPI: Hemoglobin continues to drop despite oozing antral lesion seen on EGD status post clipping.  Plan for red cell scan today.  ROS:  All other ROS reviewed and negative. Pertinent positives noted in the HPI.     Inpatient Medications  Scheduled Meds:  aspirin  81 mg Oral Daily   carbamazepine  600 mg Oral BID   DULoxetine  60 mg Oral Daily   ethosuximide  500 mg Oral BID   ezetimibe  10 mg Oral Daily   levothyroxine  50 mcg Oral Daily   memantine  10 mg Oral BID   [START ON 03/17/2021] pantoprazole  40 mg Intravenous Q12H   prasugrel  10 mg Oral Daily   Continuous Infusions:  pantoprazole 8 mg/hr (03/15/21 1146)   PRN Meds: acetaminophen, azelastine, hydrALAZINE, LORazepam, ondansetron **OR** ondansetron (ZOFRAN) IV, tiZANidine   Vital Signs   Vitals:   03/15/21 0749 03/15/21 0800 03/15/21 0840 03/15/21 1141  BP: (!) 115/57 (!) 120/59 (!) 122/56 123/66  Pulse: 87 88 87 92  Resp: '19 18 18 20  '$ Temp: 97.6 F (36.4 C) 98.3 F (36.8 C) 97.6 F (36.4 C) (!) 97.5 F (36.4 C)  TempSrc: Oral Oral Oral Oral  SpO2: 100% 100% 100% 100%  Weight:      Height:        Intake/Output Summary (Last 24 hours) at 03/15/2021 1221 Last data filed at 03/15/2021 1141 Gross per 24 hour  Intake 841.87 ml  Output --  Net 841.87 ml   Last 3 Weights 03/14/2021 03/08/2021 01/14/2021  Weight (lbs) 168 lb 170 lb 173 lb 9.6 oz  Weight (kg) 76.204 kg 77.111 kg 78.744 kg      Telemetry  Overnight telemetry shows sinus rhythm with intermittent sinus tachycardia, which I personally reviewed.   Physical Exam   Vitals:   03/15/21 0749 03/15/21 0800 03/15/21 0840 03/15/21 1141  BP: (!) 115/57 (!) 120/59 (!) 122/56 123/66  Pulse: 87 88 87 92  Resp: '19 18 18 20  '$ Temp: 97.6 F (36.4 C) 98.3 F  (36.8 C) 97.6 F (36.4 C) (!) 97.5 F (36.4 C)  TempSrc: Oral Oral Oral Oral  SpO2: 100% 100% 100% 100%  Weight:      Height:        Intake/Output Summary (Last 24 hours) at 03/15/2021 1221 Last data filed at 03/15/2021 1141 Gross per 24 hour  Intake 841.87 ml  Output --  Net 841.87 ml    Last 3 Weights 03/14/2021 03/08/2021 01/14/2021  Weight (lbs) 168 lb 170 lb 173 lb 9.6 oz  Weight (kg) 76.204 kg 77.111 kg 78.744 kg    Body mass index is 24.81 kg/m.  General: Well nourished, well developed, in no acute distress Head: Atraumatic, normal size  Eyes: PEERLA, EOMI  Neck: Supple, no JVD Endocrine: No thryomegaly Cardiac: Normal S1, S2; RRR; no murmurs, rubs, or gallops Lungs: Clear to auscultation bilaterally, no wheezing, rhonchi or rales  Abd: Soft, nontender, no hepatomegaly  Ext: No edema, pulses 2+ Musculoskeletal: No deformities, BUE and BLE strength normal and equal Skin: Warm and dry, no rashes   Neuro: Alert and oriented to person, place, time, and situation, CNII-XII grossly intact, no focal deficits  Psych: Normal mood and affect   Labs  High Sensitivity Troponin:   Recent Labs  Lab 03/08/21  1155 03/08/21 1404  TROPONINIHS 39* 330*     Cardiac EnzymesNo results for input(s): TROPONINI in the last 168 hours. No results for input(s): TROPIPOC in the last 168 hours.  Chemistry Recent Labs  Lab 03/09/21 0218 03/13/21 0927 03/13/21 1013 03/15/21 0313  NA 132* 134* 135 133*  K 3.9 4.9 4.8 3.3*  CL 99 103 103 104  CO2 26 23  --  22  GLUCOSE 102* 127* 122* 113*  BUN 12 39* 42* 25*  CREATININE 0.69 0.68 0.60 0.62  CALCIUM 8.7* 8.4*  --  8.0*  PROT  --  6.5  --   --   ALBUMIN  --  3.3*  --   --   AST  --  17  --   --   ALT  --  14  --   --   ALKPHOS  --  43  --   --   BILITOT  --  0.5  --   --   GFRNONAA >60 >60  --  >60  ANIONGAP 7 8  --  7    Hematology Recent Labs  Lab 03/09/21 0218 03/13/21 0927 03/13/21 1013 03/14/21 1211 03/14/21 2041  03/15/21 0313  WBC 8.5 10.9*  --   --   --  10.9*  RBC 4.01 3.95  --   --   --  2.08*  HGB 12.1 12.0   < > 8.0* 7.5* 6.5*  HCT 35.4* 35.9*   < > 24.1* 23.0* 18.5*  MCV 88.3 90.9  --   --   --  88.9  MCH 30.2 30.4  --   --   --  31.3  MCHC 34.2 33.4  --   --   --  35.1  RDW 12.4 12.6  --   --   --  13.8  PLT 319 377  --   --   --  223   < > = values in this interval not displayed.   BNPNo results for input(s): BNP, PROBNP in the last 168 hours.  DDimer No results for input(s): DDIMER in the last 168 hours.   Radiology  No results found.  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 This is the culprit Normal LV function Normal LVEDP Successful PCI of the Ramus intermediate with DES x 3 in overlapping fashion.   Plan: DAPT for one year. Anticipate possible DC tomorrow.   Patient Profile  Rachel Clark is a 70 y.o. female with CAD status post CABG with recent PCI to the ramus on 03/08/2021, complete heart block status post permanent pacemaker implantation, hypertension, hyperlipidemia, orthostatic hypotension who was admitted on 03/13/2021 with acute blood loss anemia secondary to likely GI bleed.  Assessment & Plan   #Hematemesis #Acute blood loss anemia #GI bleed #Recent PCI -Underwent recent PCI to the ramus intermedius on on 12/20/2020 with several stents.  Admitted with hematemesis and GI bleed. -EGD yesterday shows visible bleeding vessel in the antrum of the stomach and is status post clipping.  Hemoglobin continues to drop.  GI believes something else could be going on.  They have ordered a tagged red blood cell scan. -Overall this is a difficult situation.  Given recent stents we cannot hold her DAPT.  She would be at very high risk for stent thrombosis.  For now we will need to manage her  GI bleed supportively and hopefully she will get through this.  For questions or updates, please contact [place] Please consult [email or web address] for contact info under   Time Spent with Patient: I have spent a total of 25 minutes with patient reviewing hospital notes, telemetry, EKGs, labs and examining the patient as well as establishing an assessment and plan that was discussed with the patient.  > 50% of time was spent in direct patient care.    Signed, Addison Naegeli. Audie Box, MD, [place]  03/15/2021 12:21 PM

## 2021-03-16 LAB — BASIC METABOLIC PANEL
Anion gap: 11 (ref 5–15)
BUN: 12 mg/dL (ref 8–23)
CO2: 24 mmol/L (ref 22–32)
Calcium: 8.2 mg/dL — ABNORMAL LOW (ref 8.9–10.3)
Chloride: 99 mmol/L (ref 98–111)
Creatinine, Ser: 0.71 mg/dL (ref 0.44–1.00)
GFR, Estimated: >60 mL/min (ref >60)
Glucose, Bld: 106 mg/dL — ABNORMAL HIGH (ref 70–99)
Potassium: 3.5 mmol/L (ref 3.5–5.1)
Sodium: 134 mmol/L — ABNORMAL LOW (ref 135–145)

## 2021-03-16 LAB — CBC
HCT: 23.9 % — ABNORMAL LOW (ref 36.0–46.0)
Hemoglobin: 8.7 g/dL — ABNORMAL LOW (ref 12.0–15.0)
MCH: 30.9 pg (ref 26.0–34.0)
MCHC: 36.4 g/dL — ABNORMAL HIGH (ref 30.0–36.0)
MCV: 84.8 fL (ref 80.0–100.0)
Platelets: 203 10*3/uL (ref 150–400)
RBC: 2.82 MIL/uL — ABNORMAL LOW (ref 3.87–5.11)
RDW: 14.6 % (ref 11.5–15.5)
WBC: 9.9 10*3/uL (ref 4.0–10.5)
nRBC: 0 % (ref 0.0–0.2)

## 2021-03-16 LAB — PREPARE RBC (CROSSMATCH)

## 2021-03-16 LAB — HEMOGLOBIN AND HEMATOCRIT, BLOOD
HCT: 30.1 % — ABNORMAL LOW (ref 36.0–46.0)
Hemoglobin: 10.8 g/dL — ABNORMAL LOW (ref 12.0–15.0)

## 2021-03-16 MED ORDER — SODIUM CHLORIDE 0.9% IV SOLUTION: Freq: Once | INTRAVENOUS | Status: AC

## 2021-03-16 MED ORDER — FUROSEMIDE 20 MG PO TABS: 20.0000 mg | ORAL_TABLET | Freq: Once | ORAL | Status: DC

## 2021-03-16 MED ORDER — FUROSEMIDE 10 MG/ML IJ SOLN
20.0000 mg | Freq: Once | INTRAMUSCULAR | Status: AC
  Administered 2021-03-16: 20 mg via INTRAVENOUS
  Filled 2021-03-16: qty 2

## 2021-03-16 NOTE — Care Management Important Message (Signed)
Important Message  Patient Details  Name: Rachel Clark MRN: [id number] Date of Birth: 13-Nov-1950   Medicare Important Message Given:  Yes     Rachel Clark 03/16/2021, 4:24 PM

## 2021-03-16 NOTE — Progress Notes (Signed)
Subjective: Patient was seen and examined at bedside in presence of her husband and son. She reports feeling well and is happy about her diet being advanced. Her last bowel movement was yesterday which was formed and dark. Denies further episodes of nausea or vomiting. Denies abdominal pain.  Objective: Vital signs in last 24 hours: Temp:  [97.4 F (36.3 C)-98.4 F (36.9 C)] 97.4 F (36.3 C) (09/14 1227) Pulse Rate:  [77-91] 77 (09/14 1227) Resp:  [16-19] 17 (09/14 1227) BP: (103-131)/(49-94) 131/55 (09/14 1227) SpO2:  [96 %-100 %] 99 % (09/14 1227) Weight change:  Last BM Date: 03/13/21  PE: Not in distress, appears more cheerful than other days GENERAL: Mild pallor  ABDOMEN: Soft, nondistended, nontender, normoactive bowel sounds EXTREMITIES: No deformity, no edema  Lab Results: Results for orders placed or performed during the hospital encounter of 03/13/21 (from the past 48 hour(s))  Hemoglobin and hematocrit, blood     Status: Abnormal   Collection Time: 03/14/21  8:41 PM  Result Value Ref Range   Hemoglobin 7.5 (L) 12.0 - 15.0 g/dL   HCT 23.0 (L) 36.0 - 46.0 %    Comment: Performed at [facility], [address], [place], [place] [id number]  Basic metabolic panel     Status: Abnormal   Collection Time: 03/15/21  3:13 AM  Result Value Ref Range   Sodium 133 (L) 135 - 145 mmol/L   Potassium 3.3 (L) 3.5 - 5.1 mmol/L   Chloride 104 98 - 111 mmol/L   CO2 22 22 - 32 mmol/L   Glucose, Bld 113 (H) 70 - 99 mg/dL    Comment: Glucose reference range applies only to samples taken after fasting for at least 8 hours.   BUN 25 (H) 8 - 23 mg/dL   Creatinine, Ser 0.62 0.44 - 1.00 mg/dL   Calcium 8.0 (L) 8.9 - 10.3 mg/dL   GFR, Estimated >60 >60 mL/min    Comment: (NOTE) Calculated using the CKD-EPI Creatinine Equation (2021)    Anion gap 7 5 - 15    Comment: Performed at [facility] [address], [place], [place] [address]  CBC     Status: Abnormal    Collection Time: 03/15/21  3:13 AM  Result Value Ref Range   WBC 10.9 (H) 4.0 - 10.5 K/uL   RBC 2.08 (L) 3.87 - 5.11 MIL/uL   Hemoglobin 6.5 (LL) 12.0 - 15.0 g/dL    Comment: REPEATED TO VERIFY THIS CRITICAL RESULT HAS VERIFIED AND BEEN CALLED TO Y. CROSS RN BY ALICIA JOHNSON ON 09 13 2022 AT 0344, AND HAS BEEN READ BACK.     HCT 18.5 (L) 36.0 - 46.0 %   MCV 88.9 80.0 - 100.0 fL   MCH 31.3 26.0 - 34.0 pg   MCHC 35.1 30.0 - 36.0 g/dL   RDW 13.8 11.5 - 15.5 %   Platelets 223 150 - 400 K/uL   nRBC 0.0 0.0 - 0.2 %    Comment: Performed at [facility] [address] [place], [place] [address]  Prepare RBC (crossmatch)     Status: None   Collection Time: 03/15/21  3:13 AM  Result Value Ref Range   Order Confirmation      ORDER PROCESSED BY BLOOD BANK Performed at [facility], [place] [address] [place], [place] [address]   Hemoglobin and hematocrit, blood     Status: Abnormal   Collection Time: 03/15/21  4:56 PM  Result Value  Ref Range   Hemoglobin 9.4 (L) 12.0 - 15.0 g/dL    Comment: REPEATED TO VERIFY POST TRANSFUSION SPECIMEN    HCT 26.1 (L) 36.0 - 46.0 %    Comment: Performed at [facility] [address], [place], [place] [address]  Hemoglobin and hematocrit, blood     Status: Abnormal   Collection Time: 03/15/21  9:23 PM  Result Value Ref Range   Hemoglobin 8.6 (L) 12.0 - 15.0 g/dL   HCT 24.1 (L) 36.0 - 46.0 %    Comment: Performed at [facility] [address] [place], [place] [id number]  Basic metabolic panel     Status: Abnormal   Collection Time: 03/16/21  5:33 AM  Result Value Ref Range   Sodium 134 (L) 135 - 145 mmol/L   Potassium 3.5 3.5 - 5.1 mmol/L   Chloride 99 98 - 111 mmol/L   CO2 24 22 - 32 mmol/L   Glucose, Bld 106 (H) 70 - 99 mg/dL    Comment: Glucose reference range applies only to samples taken after fasting for at least 8 hours.   BUN 12 8 - 23 mg/dL   Creatinine, Ser 0.71 0.44 - 1.00 mg/dL   Calcium 8.2 (L) 8.9 - 10.3  mg/dL   GFR, Estimated >60 >60 mL/min    Comment: (NOTE) Calculated using the CKD-EPI Creatinine Equation (2021)    Anion gap 11 5 - 15    Comment: Performed at [facility] [address] [place], [place] [address]  CBC     Status: Abnormal   Collection Time: 03/16/21  5:33 AM  Result Value Ref Range   WBC 9.9 4.0 - 10.5 K/uL   RBC 2.82 (L) 3.87 - 5.11 MIL/uL   Hemoglobin 8.7 (L) 12.0 - 15.0 g/dL   HCT 23.9 (L) 36.0 - 46.0 %   MCV 84.8 80.0 - 100.0 fL   MCH 30.9 26.0 - 34.0 pg   MCHC 36.4 (H) 30.0 - 36.0 g/dL   RDW 14.6 11.5 - 15.5 %   Platelets 203 150 - 400 K/uL   nRBC 0.0 0.0 - 0.2 %    Comment: Performed at [facility], [place] [address] [place], [place] [address]  Prepare RBC (crossmatch)     Status: None   Collection Time: 03/16/21 10:56 AM  Result Value Ref Range   Order Confirmation      ORDER PROCESSED BY BLOOD BANK Performed at [facility], [place] [address] [place], [place] [address]     Studies/Results: NM GI Blood Loss  Result Date: 03/15/2021 CLINICAL DATA:  Melena. EXAM: NUCLEAR MEDICINE GASTROINTESTINAL BLEEDING SCAN TECHNIQUE: Sequential abdominal images were obtained following intravenous administration of Tc-15mlabeled red blood cells. RADIOPHARMACEUTICALS:  24.7 mCi Tc-935mertechnetate in-vitro labeled red cells. COMPARISON:  Nov 03, 2014. FINDINGS: Normal uptake is noted in the liver and urinary bladder and vasculature. No definite abnormal uptake is noted to suggest active gastrointestinal bleeding. IMPRESSION: No definite scintigraphic evidence of gastrointestinal bleeding. Electronically Signed   By: JaMarijo Conception.D.   On: 03/15/2021 15:47    Medications: I have reviewed the patient's current medications.  Assessment: Bleeding from antral area status post epinephrine injection and Endo Clip placement Drug-eluting stent placed on 03/08/2021, continued on prasugrel and aspirin  Hemoglobin 9.4 yesterday evening, 8.6 last night and  8.7 today morning Hemodynamically stable GI bleeding scan negative yesterday afternoon  Plan: Will start regular diet 1 more unit  PRBC transfusion ordered with the intention to keep hemoglobin around 10 Continue Protonix drip while patient is in the hospital. If hemoglobin remains stable, and patient remains hemodynamically stable, possible discharge in a.m. tomorrow. Recommend pantoprazole 40 mg twice a day for 2 months on discharge, thereafter to be continued on pantoprazole 40 mg indefinitely as patient will require aspirin and possibly long-term dual antiplatelet therapy.   Ronnette Juniper, MD 03/16/2021, 1:07 PM

## 2021-03-16 NOTE — Progress Notes (Signed)
PROGRESS NOTE    Rachel Clark  [id number] DOB: 05/07/1951 DOA: 03/13/2021 PCP: Harlan Stains, MD    Brief Narrative: 70 year old female with CAD recent  NSTEMI with Stent x3, on ASA, Effient, HTN, HLD, Seizure Disorder/Memory Loss, Colon Cancer S/P resection Presents with Coffee-Ground Material Vomiting x 3 days.  Recently she has been experiencing orthostatic hypotension and syncope and was advised to reduce sodium in her diet and drink Gatorade. Previous Colonoscopy 04/2018: 6 tubular adenomas and one sessile serrated adenoma removed, repeat recommended in 3 years Colonoscopy 2014: Normal-appearing anastomosis, recommended repeat in 3 years Colonoscopy 2013: Adenocarcinoma of transverse colon Laparoscopic transverse colectomy in 2013.  In ED BP-borderline given-received  NNS BOLUS, hba 12.1, 4 days PTA, GI and cardio was consulted and admitted. 9/12- Patient underwent EGD that showed visual blood vessels versus adherent clot at antrum status post epinephrine injection and Endo Clip and placed on PPI drip. 9/12-hemoglobin down to 7.5 g> 6.5 gm-received 2 unit PRBC transfusion, also underwent bleeding scan that was negative  Subjective:  Overnight hemoglobin fairly stable and at 7 g. Resting comfortably. Has a chronic dizziness which is not new. No bowel movement yet. Husband at the bedside.  Assessment & Plan:  Coffee-ground emesis Upper GI bleeding Acute blood loss anemia due to GI bleed: baseline hb 12 gm 9/12-EGD-that revealed LA grade a esophagitis, coffee-ground, or blood in the greater curvature distal vessels versus adherent clot at antrum treated with epinephrine injection and endoclips placement.   9/13 bleeding scan negative.  Additional 2 unit PRBC transfusion 9/13 and hemoglobin stable since.  Target is to keep close to 10 g 2/2 stent 03/10/21-we will give additional 1 unit PRBC. On CLD, PPI drip, GI following closely.  Monitor H&H closely.Unfortunately She  remains on aspirin and Effient due to recent stent-at risk of instent thrombosis and will need to continue blood transfusion and supportive care. Recent Labs  Lab 03/14/21 2041 03/15/21 0313 03/15/21 1656 03/15/21 2123 03/16/21 0533  HGB 7.5* 6.5* 9.4* 8.6* 8.7*  HCT 23.0* 18.5* 26.1* 24.1* 23.9*     CAD with severe three-vessel obstructive CAD patent LIMA to LAD, known occlusion of SVG to RCA patent stent in the RCA -had PCI of the ramus intermedius with DES x3 on 03/08/2021 cardiology seeing and will need to continue ASA and Effient at home HTN-recent hypotension at home.  Coreg on hold HLD: On Zetia Orthostatic hypotension past 6 months her cardiologist has been weaning down on her meds..  Normally on compression stocking. Hypokalemia resolved Recent Labs  Lab 03/13/21 0927 03/13/21 1013 03/15/21 0313 03/16/21 0533  K 4.9 4.8 3.3* 3.5     Seizure Disorder/Memory Loss: Continue home ethosuximide  Colon Cancer S/P resection   Hypothyroidism on Synthroid  Diet Order             Diet clear liquid Room service appropriate? Yes; Fluid consistency: Thin  Diet effective now                  Patient's Body mass index is 24.81 kg/m.  DVT prophylaxis: SCDs Start: 03/13/21 1500 Code Status:   Code Status: Full Code  Family Communication: plan of care discussed with patient  and husband at bedside. Status is: Inpatient Remains inpatient appropriate because:IV treatments appropriate due to intensity of illness or inability to take PO and Inpatient level of care appropriate due to severity of illness Dispo: The patient is from: Home  Anticipated d/c is to: Home              Patient currently is not medically stable to d/c.   Difficult to place patient No  Unresulted Labs (From admission, onward)     Start     Ordered   03/15/21 1400  Hemoglobin and hematocrit, blood  Now then every 8 hours,   R (with TIMED occurrences)     Question:  Specimen collection  method  Answer:  Lab=Lab collect   03/15/21 1337   03/15/21 [id number]  Basic metabolic panel  Daily,   R     Question:  Specimen collection method  Answer:  Lab=Lab collect   03/14/21 0827   03/15/21 0500  CBC  Daily,   R     Question:  Specimen collection method  Answer:  Lab=Lab collect   03/14/21 0827           Medications reviewed:  Scheduled Meds:  aspirin  81 mg Oral Daily   carbamazepine  600 mg Oral BID   DULoxetine  60 mg Oral Daily   ethosuximide  500 mg Oral BID   ezetimibe  10 mg Oral Daily   levothyroxine  50 mcg Oral Daily   memantine  10 mg Oral BID   [START ON 03/17/2021] pantoprazole  40 mg Intravenous Q12H   prasugrel  10 mg Oral Daily   Continuous Infusions:  pantoprazole 8 mg/hr (03/15/21 2239)   Consultants:see note  Procedures:see note Antimicrobials: Anti-infectives (From admission, onward)    None      Culture/Microbiology    Component Value Date/Time   SDES URINE, CLEAN CATCH 05/07/2012 1325   SPECREQUEST NONE 05/07/2012 1325   CULT ESCHERICHIA COLI 05/07/2012 1325   REPTSTATUS 05/09/2012 FINAL 05/07/2012 1325    Other culture-see note  Objective: Vitals: Today's Vitals   03/15/21 1950 03/16/21 0000 03/16/21 0311 03/16/21 0740  BP: (!) 125/54 (!) 109/94 (!) 108/49 (!) 103/55  Pulse: 90 83 91 77  Resp: '18 18 18 17  '$ Temp: 97.7 F (36.5 C) 98.4 F (36.9 C) 97.7 F (36.5 C) 98.2 F (36.8 C)  TempSrc: Oral Oral Oral Oral  SpO2: 100% 98% 96% 97%  Weight:      Height:      PainSc:        Intake/Output Summary (Last 24 hours) at 03/16/2021 0812 Last data filed at 03/15/2021 1500 Gross per 24 hour  Intake 424.88 ml  Output --  Net 424.88 ml    Filed Weights   03/14/21 0133  Weight: 76.2 kg   Weight change:   Intake/Output from previous day: 09/13 0701 - 09/14 0700 In: 443.4 [I.V.:68.9; Blood:374.5] Out: -  Intake/Output this shift: No intake/output data recorded. Filed Weights   03/14/21 0133  Weight: 76.2 kg    Examination: General exam: AAOx 3, pleasant.  Not in distress HEENT:Oral mucosa moist, Ear/Nose WNL grossly, dentition normal. Respiratory system: bilaterally diminished, no use of accessory muscle Cardiovascular system: S1 & S2 +, No JVD,. Gastrointestinal system: Abdomen soft, NT,ND, BS+ Nervous System:Alert, awake, moving extremities and grossly nonfocal Extremities: no edema, distal peripheral pulses palpable.  Skin: No rashes,no icterus. MSK: Normal muscle bulk,tone, power   Data Reviewed: I have personally reviewed following labs and imaging studies CBC: Recent Labs  Lab 03/13/21 0927 03/13/21 1013 03/14/21 2041 03/15/21 0313 03/15/21 1656 03/15/21 2123 03/16/21 0533  WBC 10.9*  --   --  10.9*  --   --  9.9  NEUTROABS 8.5*  --   --   --   --   --   --  HGB 12.0   < > 7.5* 6.5* 9.4* 8.6* 8.7*  HCT 35.9*   < > 23.0* 18.5* 26.1* 24.1* 23.9*  MCV 90.9  --   --  88.9  --   --  84.8  PLT 377  --   --  223  --   --  203   < > = values in this interval not displayed.    Basic Metabolic Panel: Recent Labs  Lab 03/13/21 0927 03/13/21 1013 03/15/21 0313 03/16/21 0533  NA 134* 135 133* 134*  K 4.9 4.8 3.3* 3.5  CL 103 103 104 99  CO2 23  --  22 24  GLUCOSE 127* 122* 113* 106*  BUN 39* 42* 25* 12  CREATININE 0.68 0.60 0.62 0.71  CALCIUM 8.4*  --  8.0* 8.2*    GFR: Estimated Creatinine Clearance: 67.4 mL/min (by C-G formula based on SCr of 0.71 mg/dL). Liver Function Tests: Recent Labs  Lab 03/13/21 0927  AST 17  ALT 14  ALKPHOS 43  BILITOT 0.5  PROT 6.5  ALBUMIN 3.3*    Recent Labs  Lab 03/13/21 0927  LIPASE 22    No results for input(s): AMMONIA in the last 168 hours. Coagulation Profile: Recent Labs  Lab 03/13/21 0927  INR 1.1    Cardiac Enzymes: No results for input(s): CKTOTAL, CKMB, CKMBINDEX, TROPONINI in the last 168 hours. BNP (last 3 results) No results for input(s): PROBNP in the last 8760 hours. HbA1C: No results for input(s):  HGBA1C in the last 72 hours. CBG: No results for input(s): GLUCAP in the last 168 hours. Lipid Profile: No results for input(s): CHOL, HDL, LDLCALC, TRIG, CHOLHDL, LDLDIRECT in the last 72 hours. Thyroid Function Tests: No results for input(s): TSH, T4TOTAL, FREET4, T3FREE, THYROIDAB in the last 72 hours. Anemia Panel: No results for input(s): VITAMINB12, FOLATE, FERRITIN, TIBC, IRON, RETICCTPCT in the last 72 hours. Sepsis Labs: Recent Labs  Lab 03/13/21 0958  LATICACIDVEN 1.7     Recent Results (from the past 240 hour(s))  Resp Panel by RT-PCR (Flu A&B, Covid) Nasopharyngeal Swab     Status: None   Collection Time: 03/08/21  1:30 PM   Specimen: Nasopharyngeal Swab; Nasopharyngeal(NP) swabs in vial transport medium  Result Value Ref Range Status   SARS Coronavirus 2 by RT PCR NEGATIVE NEGATIVE Final    Comment: (NOTE) SARS-CoV-2 target nucleic acids are NOT DETECTED.  The SARS-CoV-2 RNA is generally detectable in upper respiratory specimens during the acute phase of infection. The lowest concentration of SARS-CoV-2 viral copies this assay can detect is 138 copies/mL. A negative result does not preclude SARS-Cov-2 infection and should not be used as the sole basis for treatment or other patient management decisions. A negative result may occur with  improper specimen collection/handling, submission of specimen other than nasopharyngeal swab, presence of viral mutation(s) within the areas targeted by this assay, and inadequate number of viral copies(<138 copies/mL). A negative result must be combined with clinical observations, patient history, and epidemiological information. The expected result is Negative.  Fact Sheet for Patients:  EntrepreneurPulse.com.au  Fact Sheet for Healthcare Providers:  IncredibleEmployment.be  This test is no t yet approved or cleared by the [place] FDA and  has been authorized for detection and/or  diagnosis of SARS-CoV-2 by FDA under an Emergency Use Authorization (EUA). This EUA will remain  in effect (meaning this test can be used) for the duration of the COVID-19 declaration under Section 564(b)(1) of the Act, 21 U.S.C.section 360bbb-3(b)(1), unless  the authorization is terminated  or revoked sooner.       Influenza A by PCR NEGATIVE NEGATIVE Final   Influenza B by PCR NEGATIVE NEGATIVE Final    Comment: (NOTE) The Xpert Xpress SARS-CoV-2/FLU/RSV plus assay is intended as an aid in the diagnosis of influenza from Nasopharyngeal swab specimens and should not be used as a sole basis for treatment. Nasal washings and aspirates are unacceptable for Xpert Xpress SARS-CoV-2/FLU/RSV testing.  Fact Sheet for Patients: EntrepreneurPulse.com.au  Fact Sheet for Healthcare Providers: IncredibleEmployment.be  This test is not yet approved or cleared by the [place] FDA and has been authorized for detection and/or diagnosis of SARS-CoV-2 by FDA under an Emergency Use Authorization (EUA). This EUA will remain in effect (meaning this test can be used) for the duration of the COVID-19 declaration under Section 564(b)(1) of the Act, 21 U.S.C. section 360bbb-3(b)(1), unless the authorization is terminated or revoked.  Performed at [facility], [place] [address] [place], [place] [address]   SARS CORONAVIRUS 2 (TAT 6-24 HRS) Nasopharyngeal Nasopharyngeal Swab     Status: None   Collection Time: 03/14/21  4:57 AM   Specimen: Nasopharyngeal Swab  Result Value Ref Range Status   SARS Coronavirus 2 NEGATIVE NEGATIVE Final    Comment: (NOTE) SARS-CoV-2 target nucleic acids are NOT DETECTED.  The SARS-CoV-2 RNA is generally detectable in upper and lower respiratory specimens during the acute phase of infection. Negative results do not preclude SARS-CoV-2 infection, do not rule out co-infections with other pathogens, and should not be used as  the sole basis for treatment or other patient management decisions. Negative results must be combined with clinical observations, patient history, and epidemiological information. The expected result is Negative.  Fact Sheet for Patients: SugarRoll.be  Fact Sheet for Healthcare Providers: https://www.woods-mathews.com/  This test is not yet approved or cleared by the [place] FDA and  has been authorized for detection and/or diagnosis of SARS-CoV-2 by FDA under an Emergency Use Authorization (EUA). This EUA will remain  in effect (meaning this test can be used) for the duration of the COVID-19 declaration under Se ction 564(b)(1) of the Act, 21 U.S.C. section 360bbb-3(b)(1), unless the authorization is terminated or revoked sooner.  Performed at [facility], [place] [address], [place], [place] [address]       Radiology Studies: NM GI Blood Loss  Result Date: 03/15/2021 CLINICAL DATA:  Melena. EXAM: NUCLEAR MEDICINE GASTROINTESTINAL BLEEDING SCAN TECHNIQUE: Sequential abdominal images were obtained following intravenous administration of Tc-16mlabeled red blood cells. RADIOPHARMACEUTICALS:  24.7 mCi Tc-915mertechnetate in-vitro labeled red cells. COMPARISON:  Nov 03, 2014. FINDINGS: Normal uptake is noted in the liver and urinary bladder and vasculature. No definite abnormal uptake is noted to suggest active gastrointestinal bleeding. IMPRESSION: No definite scintigraphic evidence of gastrointestinal bleeding. Electronically Signed   By: JaMarijo Conception.D.   On: 03/15/2021 15:47     LOS: 3 days   RaAntonieta PertMD [facility]  03/16/2021, 8:12 AM

## 2021-03-16 NOTE — Discharge Summary (Signed)
Physician Discharge Summary  ZOELLA HAMMAKER [id number] DOB: [birth year] DOA: 03/13/2021  PCP: Harlan Stains, MD  Admit date: 03/13/2021 Discharge date: 03/17/2021  Admitted From: home Disposition:  home  Recommendations for Outpatient Follow-up:  Follow up with PCP and GI in 1-2 weeks Please obtain CBC in one week  Home Health:no  Equipment/Devices: none  Discharge Condition: Stable Code Status:   Code Status: Full Code Diet recommendation:  Diet Order             Diet Heart Room service appropriate? Yes; Fluid consistency: Thin  Diet effective now                    Brief/Interim Summary: Seizure Disorder/Memory Loss, Colon Cancer S/P resection Presents with Coffee-Ground Material Vomiting x 3 days.  Recently she has been experiencing orthostatic hypotension and syncope and was advised to reduce sodium in her diet and drink Gatorade. Previous Colonoscopy 04/2018: 6 tubular adenomas and one sessile serrated adenoma removed, repeat recommended in 3 years Colonoscopy 2014: Normal-appearing anastomosis, recommended repeat in 3 years Colonoscopy 2013: Adenocarcinoma of transverse colon Laparoscopic transverse colectomy in 2013.   In ED BP-borderline given-received  NNS BOLUS, hba 12.1, 4 days PTA, GI and cardio was consulted and admitted. 9/12- Patient underwent EGD that showed visual blood vessels versus adherent clot at antrum status post epinephrine injection and Endo Clip and placed on PPI drip. 9/12-hemoglobin down to 7.5 g> 6.5 gm-received 2 unit PRBC transfusion, also underwent bleeding scan that was negative  AND 1 UNIT ON 9/14 OT KEEP HB CLOSE TO 10 gm. And hb stable. Okay for d/c home as per GI this am. Seen  by cardio prior to d.c  Discharge Diagnoses:  Upper GI bleeding Acute blood loss anemia due to GI bleed: baseline hb 12 gm 9/12-EGD-that revealed LA grade a esophagitis, coffee-ground, or blood in the greater curvature distal vessels versus adherent clot  at antrum treated with epinephrine injection and endoclips placement.   9/13 bleeding scan negative.  Additional 2 unit PRBC transfused 9/13, AND 1 UNIT ON 9/14 OT KEEP HB CLOSE TO 10 gm. Managed w/  PPI drip, GI following closely.Unfortunately She remains on aspirin and Effient due to recent stent-at risk of instent thrombosis. Hb stable overall. Okay for /dc home on ppi BID x 2 months then qdauly. See pcp in a week. Recent Labs  Lab 03/15/21 1656 03/15/21 2123 03/16/21 0533 03/16/21 1835 03/17/21 0550  HGB 9.4* 8.6* 8.7* 10.8* 9.6*  HCT 26.1* 24.1* 23.9* 30.1* 27.5*    CAD with severe three-vessel obstructive CAD patent LIMA to LAD, known occlusion of SVG to RCA patent stent in the RCA -had PCI of the ramus intermedius with DES x3 on 03/08/2021 cardiology seeing and will need to continue ASA and Effient at home HTN-recent hypotension at home.  Coreg on hold HLD: On Zetia Orthostatic hypotension past 6 months her cardiologist has been weaning down on her meds..  Normally on compression stocking. Hypokalemia resolved after replacement. Recent Labs  Lab 03/13/21 1013 03/15/21 0313 03/16/21 0533 03/17/21 0550 03/17/21 1304  K 4.8 3.3* 3.5 3.1* 3.9    Seizure Disorder/Memory Loss: Continue home ethosuximide and other home meds.  Colon Cancer S/P resection   Hypothyroidism on Synthroid   Consults: Gi cardiology  Subjective: Alert,awake, no complaints.'  Discharge Exam: Vitals:   03/17/21 0745 03/17/21 1223  BP:  110/88  Pulse:    Resp:  20  Temp:  (!) 97.4 F (36.3 C)  SpO2: 97% 97%   General: Pt is alert, awake, not in acute distress Cardiovascular: RRR, S1/S2 +, no rubs, no gallops Respiratory: CTA bilaterally, no wheezing, no rhonchi Abdominal: Soft, NT, ND, bowel sounds + Extremities: no edema, no cyanosis  Discharge Instructions  Discharge Instructions     Discharge instructions   Complete by: As directed    Please call call MD or return to ER for similar  or worsening recurring problem that brought you to hospital or if any fever,nausea/vomiting,abdominal pain, uncontrolled pain, chest pain,  shortness of breath or any other alarming symptoms.  Please follow-up your doctor as instructed in a week time and call the office for appointment.  Please avoid alcohol, smoking, or any other illicit substance and maintain healthy habits including taking your regular medications as prescribed.  You were cared for by a hospitalist during your hospital stay. If you have any questions about your discharge medications or the care you received while you were in the hospital after you are discharged, you can call the unit and ask to speak with the hospitalist on call if the hospitalist that took care of you is not available.  Once you are discharged, your primary care physician will handle any further medical issues. Please note that NO REFILLS for any discharge medications will be authorized once you are discharged, as it is imperative that you return to your primary care physician (or establish a relationship with a primary care physician if you do not have one) for your aftercare needs so that they can reassess your need for medications and monitor your lab values   Increase activity slowly   Complete by: As directed       Allergies as of 03/17/2021       Reactions   Aspirin Other (See Comments)   GI upset- can tolerate 81 mg ASA, just not full doses   Capecitabine    Other reaction(s): diarrhea   Crestor [rosuvastatin]    myalgias   Erythromycin Nausea Only   Gi upset   Fluticasone    Other reaction(s): sore throat   Lipitor [atorvastatin]    myalgias   Lisinopril Cough   Niacin And Related Other (See Comments)   Pt does not recall reactions    Statins Other (See Comments)   Muscle pain   Sulfa Drugs Cross Reactors Swelling   Sulfamethoxazole    Other reaction(s): anaphylaxis   Tetracyclines & Related Nausea Only   Penicillins Nausea Only,  Rash   Has patient had a PCN reaction causing immediate rash, facial/tongue/throat swelling, SOB or lightheadedness with hypotension: Yes Has patient had a PCN reaction causing severe rash involving mucus membranes or skin necrosis: No Has patient had a PCN reaction that required hospitalization No Has patient had a PCN reaction occurring within the last 10 years: No If all of the above answers are "NO", then may proceed with Cephalosporin use.   Plavix [clopidogrel Bisulfate] Rash        Medication List     TAKE these medications    acetaminophen 500 MG tablet Commonly known as: TYLENOL Take 500-1,000 mg by mouth daily as needed for mild pain or headache.   aspirin 81 MG chewable tablet Chew 1 tablet (81 mg total) by mouth daily.   Azelastine HCl 0.15 % Soln Place 1 spray into both nostrils daily as needed (seasonal allergies).   carbamazepine 200 MG 12 hr tablet Commonly known as: TEGRETOL XR Take 3 tablets (600 mg total) by mouth 2 (  two) times daily.   carvedilol 3.125 MG tablet Commonly known as: COREG Take 1 tablet (3.125 mg total) by mouth 2 (two) times daily.   DULoxetine 60 MG capsule Commonly known as: CYMBALTA Take 60 mg by mouth daily.   ethosuximide 250 MG capsule Commonly known as: ZARONTIN TAKE 2 CAPSULES IN THE MORNING, TAKE 2 CAPSULES AT NIGHT   levothyroxine 50 MCG tablet Commonly known as: SYNTHROID Take 50 mcg by mouth daily.   memantine 10 MG tablet Commonly known as: Namenda Take 1 tablet (10 mg total) by mouth 2 (two) times daily.   Nexlizet 180-10 MG Tabs Generic drug: Bempedoic Acid-Ezetimibe Take 180 mg by mouth daily.   nitroGLYCERIN 0.4 MG SL tablet Commonly known as: NITROSTAT Place 1 tablet (0.4 mg total) under the tongue every 5 (five) minutes as needed. For chest pain.   pantoprazole 40 MG tablet Commonly known as: Protonix Take 1 tablet (40 mg total) by mouth 2 (two) times daily. AFTER 2 MONTHS CONT AS ONCE A DAY    prasugrel 10 MG Tabs tablet Commonly known as: EFFIENT Take 1 tablet (10 mg total) by mouth daily.   tiZANidine 2 MG tablet Commonly known as: ZANAFLEX Take by mouth every 6 (six) hours as needed for muscle spasms.        Follow-up Information     Ronnette Juniper, MD. Call.   Specialty: Gastroenterology Contact information: [place] [address] [phone]         Harlan Stains, MD Follow up in 1 week(s).   Specialty: Family Medicine Contact information: [place] [address] [phone]         [name], Peter M, MD .   Specialty: Cardiology Contact information: [address] [place] [address] [phone]                Allergies  Allergen Reactions   Aspirin Other (See Comments)    GI upset- can tolerate 81 mg ASA, just not full doses   Capecitabine     Other reaction(s): diarrhea   Crestor [Rosuvastatin]     myalgias   Erythromycin Nausea Only    Gi upset   Fluticasone     Other reaction(s): sore throat   Lipitor [Atorvastatin]     myalgias   Lisinopril Cough   Niacin And Related Other (See Comments)    Pt does not recall reactions    Statins Other (See Comments)    Muscle pain   Sulfa Drugs Cross Reactors Swelling   Sulfamethoxazole     Other reaction(s): anaphylaxis   Tetracyclines & Related Nausea Only   Penicillins Nausea Only and Rash    Has patient had a PCN reaction causing immediate rash, facial/tongue/throat swelling, SOB or lightheadedness with hypotension: Yes Has patient had a PCN reaction causing severe rash involving mucus membranes or skin necrosis: No Has patient had a PCN reaction that required hospitalization No Has patient had a PCN reaction occurring within the last 10 years: No If all of the above answers are "NO", then may proceed with Cephalosporin use.    Plavix [Clopidogrel Bisulfate] Rash    The results of significant diagnostics from  this hospitalization (including imaging, microbiology, ancillary and laboratory) are listed below for reference.    Microbiology: Recent Results (from the past 240 hour(s))  Resp Panel by RT-PCR (Flu A&B, Covid) Nasopharyngeal Swab     Status: None   Collection Time: 03/08/21  1:30  PM   Specimen: Nasopharyngeal Swab; Nasopharyngeal(NP) swabs in vial transport medium  Result Value Ref Range Status   SARS Coronavirus 2 by RT PCR NEGATIVE NEGATIVE Final    Comment: (NOTE) SARS-CoV-2 target nucleic acids are NOT DETECTED.  The SARS-CoV-2 RNA is generally detectable in upper respiratory specimens during the acute phase of infection. The lowest concentration of SARS-CoV-2 viral copies this assay can detect is 138 copies/mL. A negative result does not preclude SARS-Cov-2 infection and should not be used as the sole basis for treatment or other patient management decisions. A negative result may occur with  improper specimen collection/handling, submission of specimen other than nasopharyngeal swab, presence of viral mutation(s) within the areas targeted by this assay, and inadequate number of viral copies(<138 copies/mL). A negative result must be combined with clinical observations, patient history, and epidemiological information. The expected result is Negative.  Fact Sheet for Patients:  EntrepreneurPulse.com.au  Fact Sheet for Healthcare Providers:  IncredibleEmployment.be  This test is no t yet approved or cleared by the [place] FDA and  has been authorized for detection and/or diagnosis of SARS-CoV-2 by FDA under an Emergency Use Authorization (EUA). This EUA will remain  in effect (meaning this test can be used) for the duration of the COVID-19 declaration under Section 564(b)(1) of the Act, 21 U.S.C.section 360bbb-3(b)(1), unless the authorization is terminated  or revoked sooner.       Influenza A by PCR NEGATIVE NEGATIVE Final    Influenza B by PCR NEGATIVE NEGATIVE Final    Comment: (NOTE) The Xpert Xpress SARS-CoV-2/FLU/RSV plus assay is intended as an aid in the diagnosis of influenza from Nasopharyngeal swab specimens and should not be used as a sole basis for treatment. Nasal washings and aspirates are unacceptable for Xpert Xpress SARS-CoV-2/FLU/RSV testing.  Fact Sheet for Patients: EntrepreneurPulse.com.au  Fact Sheet for Healthcare Providers: IncredibleEmployment.be  This test is not yet approved or cleared by the [place] FDA and has been authorized for detection and/or diagnosis of SARS-CoV-2 by FDA under an Emergency Use Authorization (EUA). This EUA will remain in effect (meaning this test can be used) for the duration of the COVID-19 declaration under Section 564(b)(1) of the Act, 21 U.S.C. section 360bbb-3(b)(1), unless the authorization is terminated or revoked.  Performed at [facility], [place] [address], [place], [place] [address]   SARS CORONAVIRUS 2 (TAT 6-24 HRS) Nasopharyngeal Nasopharyngeal Swab     Status: None   Collection Time: 03/14/21  4:57 AM   Specimen: Nasopharyngeal Swab  Result Value Ref Range Status   SARS Coronavirus 2 NEGATIVE NEGATIVE Final    Comment: (NOTE) SARS-CoV-2 target nucleic acids are NOT DETECTED.  The SARS-CoV-2 RNA is generally detectable in upper and lower respiratory specimens during the acute phase of infection. Negative results do not preclude SARS-CoV-2 infection, do not rule out co-infections with other pathogens, and should not be used as the sole basis for treatment or other patient management decisions. Negative results must be combined with clinical observations, patient history, and epidemiological information. The expected result is Negative.  Fact Sheet for Patients: SugarRoll.be  Fact Sheet for Healthcare  Providers: https://www.woods-mathews.com/  This test is not yet approved or cleared by the [place] FDA and  has been authorized for detection and/or diagnosis of SARS-CoV-2 by FDA under an Emergency Use Authorization (EUA). This EUA will remain  in effect (meaning this test can be used) for the duration of the COVID-19 declaration under Se ction 564(b)(1) of the Act,  21 U.S.C. section 360bbb-3(b)(1), unless the authorization is terminated or revoked sooner.  Performed at [facility], [place] [address], [place], [place] [address]     Procedures/Studies: DG Chest 1 View  Result Date: 03/13/2021 CLINICAL DATA:  Patient complains of brown emesis EXAM: CHEST  1 VIEW COMPARISON:  March 08, 2021, February 23, 2018 FINDINGS: The heart size and mediastinal contours are stable. Cardiac pacemaker is unchanged. Both lungs are clear. Chronic change at left hemidiaphragm is stable compared to 2019. The visualized skeletal structures are unremarkable. IMPRESSION: No active disease. Electronically Signed   By: Abelardo Diesel M.D.   On: 03/13/2021 10:31   DG Chest 2 View  Result Date: 03/08/2021 CLINICAL DATA:  CP EXAM: CHEST - 2 VIEW COMPARISON:  Nov 10, 2020. FINDINGS: Similar cardiomediastinal silhouette. Left subclavian approach cardiac rhythm maintenance device in similar position. CABG and median sternotomy. Coronary stenting. Both lungs are clear. No visible pleural effusions or pneumothorax. No acute osseous abnormality. IMPRESSION: No active cardiopulmonary disease. Electronically Signed   By: Margaretha Sheffield M.D.   On: 03/08/2021 13:30   NM GI Blood Loss  Result Date: 03/15/2021 CLINICAL DATA:  Melena. EXAM: NUCLEAR MEDICINE GASTROINTESTINAL BLEEDING SCAN TECHNIQUE: Sequential abdominal images were obtained following intravenous administration of Tc-65mlabeled red blood cells. RADIOPHARMACEUTICALS:  24.7 mCi Tc-948mertechnetate in-vitro labeled red cells. COMPARISON:  Nov 03, 2014. FINDINGS: Normal uptake is noted in the liver and urinary bladder and vasculature. No definite abnormal uptake is noted to suggest active gastrointestinal bleeding. IMPRESSION: No definite scintigraphic evidence of gastrointestinal bleeding. Electronically Signed   By: JaMarijo Conception.D.   On: 03/15/2021 15:47   CARDIAC CATHETERIZATION  Result Date: 03/08/2021   Prox LAD lesion is 100% stenosed.   Origin lesion is 100% stenosed.   Ost LAD to Prox LAD lesion is 60% stenosed.   Ost 1st Diag to 1st Diag lesion is 85% stenosed.   Ramus lesion is 99% stenosed.   Ost Ramus to Ramus lesion is 90% stenosed.   Non-stenotic Ost RCA to Mid RCA lesion was previously treated.   A drug-eluting stent was successfully placed using a STENT ONYX FRONTIER 3.0X15.   A drug-eluting stent was successfully placed using a SYNERGY XD 3.0X16.   A drug-eluting stent was successfully placed using a SYNERGY XD 3.0X20.   Post intervention, there is a 0% residual stenosis.   Post intervention, there is a 0% residual stenosis.   The left ventricular systolic function is normal.   LV end diastolic pressure is normal.   The left ventricular ejection fraction is 55-65% by visual estimate.   The radiation dose exceeded thresholds defined in the "Patient Radiation Dose Management For Interventional Medical Procedures With Extensive Use of Fluoroscopy" policy. Specific follow up instructions will be provided to the patient prior to discharge. 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 This is the culprit Normal LV function Normal LVEDP Successful PCI of the Ramus intermediate with DES x 3 in overlapping fashion. Plan: DAPT for one year. Anticipate possible DC tomorrow.    Labs: BNP (last 3 results) No results for input(s): BNP in the last 8760 hours. Basic Metabolic Panel: Recent Labs   Lab 03/13/21 0927 03/13/21 1013 03/15/21 0313 03/16/21 0533 03/17/21 0550 03/17/21 1304  NA 134* 135 133* 134* 133*  --  K 4.9 4.8 3.3* 3.5 3.1* 3.9  CL 103 103 104 99 99  --   CO2 23  --  '22 24 25  '$ --   GLUCOSE 127* 122* 113* 106* 125*  --   BUN 39* 42* 25* 12 16  --   CREATININE 0.68 0.60 0.62 0.71 0.68  --   CALCIUM 8.4*  --  8.0* 8.2* 7.9*  --    Liver Function Tests: Recent Labs  Lab 03/13/21 0927  AST 17  ALT 14  ALKPHOS 43  BILITOT 0.5  PROT 6.5  ALBUMIN 3.3*   Recent Labs  Lab 03/13/21 0927  LIPASE 22   No results for input(s): AMMONIA in the last 168 hours. CBC: Recent Labs  Lab 03/13/21 0927 03/13/21 1013 03/15/21 0313 03/15/21 1656 03/15/21 2123 03/16/21 0533 03/16/21 1835 03/17/21 0550  WBC 10.9*  --  10.9*  --   --  9.9  --  10.0  NEUTROABS 8.5*  --   --   --   --   --   --   --   HGB 12.0   < > 6.5* 9.4* 8.6* 8.7* 10.8* 9.6*  HCT 35.9*   < > 18.5* 26.1* 24.1* 23.9* 30.1* 27.5*  MCV 90.9  --  88.9  --   --  84.8  --  87.9  PLT 377  --  223  --   --  203  --  221   < > = values in this interval not displayed.   Cardiac Enzymes: No results for input(s): CKTOTAL, CKMB, CKMBINDEX, TROPONINI in the last 168 hours. BNP: Invalid input(s): POCBNP CBG: No results for input(s): GLUCAP in the last 168 hours. D-Dimer No results for input(s): DDIMER in the last 72 hours. Hgb A1c No results for input(s): HGBA1C in the last 72 hours. Lipid Profile No results for input(s): CHOL, HDL, LDLCALC, TRIG, CHOLHDL, LDLDIRECT in the last 72 hours. Thyroid function studies No results for input(s): TSH, T4TOTAL, T3FREE, THYROIDAB in the last 72 hours.  Invalid input(s): FREET3 Anemia work up No results for input(s): VITAMINB12, FOLATE, FERRITIN, TIBC, IRON, RETICCTPCT in the last 72 hours. Urinalysis    Component Value Date/Time   COLORURINE AMBER (A) 05/07/2012 1325   APPEARANCEUR CLOUDY (A) 05/07/2012 1325   LABSPEC 1.020 06/12/2014 1436   PHURINE  6.0 06/12/2014 1436   PHURINE 6.0 05/07/2012 1325   GLUCOSEU Negative 06/12/2014 1436   HGBUR Negative 06/12/2014 1436   HGBUR NEGATIVE 05/07/2012 1325   BILIRUBINUR Negative 06/12/2014 1436   KETONESUR Negative 06/12/2014 1436   KETONESUR NEGATIVE 05/07/2012 1325   PROTEINUR Negative 06/12/2014 1436   PROTEINUR 30 (A) 05/07/2012 1325   UROBILINOGEN 0.2 06/12/2014 1436   NITRITE Negative 06/12/2014 1436   NITRITE NEGATIVE 05/07/2012 1325   LEUKOCYTESUR Negative 06/12/2014 1436   Sepsis Labs Invalid input(s): PROCALCITONIN,  WBC,  LACTICIDVEN Microbiology Recent Results (from the past 240 hour(s))  Resp Panel by RT-PCR (Flu A&B, Covid) Nasopharyngeal Swab     Status: None   Collection Time: 03/08/21  1:30 PM   Specimen: Nasopharyngeal Swab; Nasopharyngeal(NP) swabs in vial transport medium  Result Value Ref Range Status   SARS Coronavirus 2 by RT PCR NEGATIVE NEGATIVE Final    Comment: (NOTE) SARS-CoV-2 target nucleic acids are NOT DETECTED.  The SARS-CoV-2 RNA is generally detectable in upper respiratory specimens during the acute phase of infection. The lowest concentration of SARS-CoV-2 viral copies this assay can detect is 138 copies/mL. A negative result does  not preclude SARS-Cov-2 infection and should not be used as the sole basis for treatment or other patient management decisions. A negative result may occur with  improper specimen collection/handling, submission of specimen other than nasopharyngeal swab, presence of viral mutation(s) within the areas targeted by this assay, and inadequate number of viral copies(<138 copies/mL). A negative result must be combined with clinical observations, patient history, and epidemiological information. The expected result is Negative.  Fact Sheet for Patients:  EntrepreneurPulse.com.au  Fact Sheet for Healthcare Providers:  IncredibleEmployment.be  This test is no t yet approved or cleared  by the [place] FDA and  has been authorized for detection and/or diagnosis of SARS-CoV-2 by FDA under an Emergency Use Authorization (EUA). This EUA will remain  in effect (meaning this test can be used) for the duration of the COVID-19 declaration under Section 564(b)(1) of the Act, 21 U.S.C.section 360bbb-3(b)(1), unless the authorization is terminated  or revoked sooner.       Influenza A by PCR NEGATIVE NEGATIVE Final   Influenza B by PCR NEGATIVE NEGATIVE Final    Comment: (NOTE) The Xpert Xpress SARS-CoV-2/FLU/RSV plus assay is intended as an aid in the diagnosis of influenza from Nasopharyngeal swab specimens and should not be used as a sole basis for treatment. Nasal washings and aspirates are unacceptable for Xpert Xpress SARS-CoV-2/FLU/RSV testing.  Fact Sheet for Patients: EntrepreneurPulse.com.au  Fact Sheet for Healthcare Providers: IncredibleEmployment.be  This test is not yet approved or cleared by the [place] FDA and has been authorized for detection and/or diagnosis of SARS-CoV-2 by FDA under an Emergency Use Authorization (EUA). This EUA will remain in effect (meaning this test can be used) for the duration of the COVID-19 declaration under Section 564(b)(1) of the Act, 21 U.S.C. section 360bbb-3(b)(1), unless the authorization is terminated or revoked.  Performed at [facility], [place] [address] [place], [place] [address]   SARS CORONAVIRUS 2 (TAT 6-24 HRS) Nasopharyngeal Nasopharyngeal Swab     Status: None   Collection Time: 03/14/21  4:57 AM   Specimen: Nasopharyngeal Swab  Result Value Ref Range Status   SARS Coronavirus 2 NEGATIVE NEGATIVE Final    Comment: (NOTE) SARS-CoV-2 target nucleic acids are NOT DETECTED.  The SARS-CoV-2 RNA is generally detectable in upper and lower respiratory specimens during the acute phase of infection. Negative results do not preclude SARS-CoV-2 infection, do not  rule out co-infections with other pathogens, and should not be used as the sole basis for treatment or other patient management decisions. Negative results must be combined with clinical observations, patient history, and epidemiological information. The expected result is Negative.  Fact Sheet for Patients: SugarRoll.be  Fact Sheet for Healthcare Providers: https://www.woods-mathews.com/  This test is not yet approved or cleared by the [place] FDA and  has been authorized for detection and/or diagnosis of SARS-CoV-2 by FDA under an Emergency Use Authorization (EUA). This EUA will remain  in effect (meaning this test can be used) for the duration of the COVID-19 declaration under Se ction 564(b)(1) of the Act, 21 U.S.C. section 360bbb-3(b)(1), unless the authorization is terminated or revoked sooner.  Performed at [facility], [place] [address] [place], [place] [address]      Time coordinating discharge: 25 minutes  SIGNED: Antonieta Pert, MD  [facility] 03/17/2021, 3:11 PM  If 7PM-7AM, please contact night-coverage [email or web address]

## 2021-03-16 NOTE — Progress Notes (Signed)
Clair Gulling, spouse of patient was updated at 78 about how patient's night had gone and most recent hemoglobin.

## 2021-03-17 DIAGNOSIS — K922 Gastrointestinal hemorrhage, unspecified: Secondary | ICD-10-CM | POA: Diagnosis not present

## 2021-03-17 LAB — TYPE AND SCREEN
ABO/RH(D): O NEG
Antibody Screen: NEGATIVE
Unit division: 0
Unit division: 0
Unit division: 0
Unit division: 0

## 2021-03-17 LAB — BPAM RBC
Blood Product Expiration Date: 202209122359
Blood Product Expiration Date: 202209142359
Blood Product Expiration Date: 202209172359
Blood Product Expiration Date: 202209172359
ISSUE DATE / TIME: 202209120527
ISSUE DATE / TIME: 202209130410
ISSUE DATE / TIME: 202209130808
ISSUE DATE / TIME: 202209141204
Unit Type and Rh: 9500
Unit Type and Rh: 9500
Unit Type and Rh: 9500
Unit Type and Rh: 9500

## 2021-03-17 LAB — CBC
HCT: 27.5 % — ABNORMAL LOW (ref 36.0–46.0)
Hemoglobin: 9.6 g/dL — ABNORMAL LOW (ref 12.0–15.0)
MCH: 30.7 pg (ref 26.0–34.0)
MCHC: 34.9 g/dL (ref 30.0–36.0)
MCV: 87.9 fL (ref 80.0–100.0)
Platelets: 221 10*3/uL (ref 150–400)
RBC: 3.13 MIL/uL — ABNORMAL LOW (ref 3.87–5.11)
RDW: 14.6 % (ref 11.5–15.5)
WBC: 10 10*3/uL (ref 4.0–10.5)
nRBC: 0 % (ref 0.0–0.2)

## 2021-03-17 LAB — BASIC METABOLIC PANEL
Anion gap: 9 (ref 5–15)
BUN: 16 mg/dL (ref 8–23)
CO2: 25 mmol/L (ref 22–32)
Calcium: 7.9 mg/dL — ABNORMAL LOW (ref 8.9–10.3)
Chloride: 99 mmol/L (ref 98–111)
Creatinine, Ser: 0.68 mg/dL (ref 0.44–1.00)
GFR, Estimated: >60 mL/min (ref >60)
Glucose, Bld: 125 mg/dL — ABNORMAL HIGH (ref 70–99)
Potassium: 3.1 mmol/L — ABNORMAL LOW (ref 3.5–5.1)
Sodium: 133 mmol/L — ABNORMAL LOW (ref 135–145)

## 2021-03-17 LAB — POTASSIUM: Potassium: 3.9 mmol/L (ref 3.5–5.1)

## 2021-03-17 MED ORDER — POTASSIUM CHLORIDE CRYS ER 20 MEQ PO TBCR
30.0000 meq | EXTENDED_RELEASE_TABLET | ORAL | Status: DC
  Administered 2021-03-17: 30 meq via ORAL
  Filled 2021-03-17: qty 1

## 2021-03-17 MED ORDER — PANTOPRAZOLE SODIUM 40 MG PO TBEC: 40.0000 mg | DELAYED_RELEASE_TABLET | Freq: Two times a day (BID) | ORAL | 1 refills | Status: DC

## 2021-03-17 MED ORDER — POTASSIUM CHLORIDE CRYS ER 20 MEQ PO TBCR
40.0000 meq | EXTENDED_RELEASE_TABLET | ORAL | Status: AC
  Administered 2021-03-17: 40 meq via ORAL
  Filled 2021-03-17: qty 2

## 2021-03-17 NOTE — TOC Initial Note (Addendum)
Transition of Care [facility]) - Initial/Assessment Note    Patient Details  Name: Rachel Clark MRN: [id number] Date of Birth: 10/25/1950  [facility] CM/SW Contact:    Verdell Carmine, RN Phone Number: 03/17/2021, 8:40 AM  Clinical Narrative:                 70 year old independent patient lives with spouse, admitted with GI Bleed. / Vomiting coffee ground emesis. EGD revealed clot at atrium, was iijected with epi and clipped.  Received several units of blood, one yesterday H&H stable .  Will DC today or tomorrow, depending on H&H.. Call to room, busy at this time, will call back for assessment of any needs. CM will follow\ [id number] will DC today, Called room, spoke to husband. He states nothing needed for DC, they have DME at home walker and 3:1 in case she needs it. Awaiting ambulation and potassium level repeat.   Expected Discharge Plan: Home/Self Care Barriers to Discharge: No Barriers Identified   Patient Goals and CMS Choice Patient states their goals for this hospitalization and ongoing recovery are:: HOme      Expected Discharge Plan and Services Expected Discharge Plan: Home/Self Care       Living arrangements for the past 2 months: Single Family Home                                      Prior Living Arrangements/Services Living arrangements for the past 2 months: Single Family Home Lives with:: Spouse Patient language and need for interpreter reviewed:: Yes        Need for Family Participation in Patient Care: Yes (Comment) Care giver support system in place?: Yes (comment)   Criminal Activity/Legal Involvement Pertinent to Current Situation/Hospitalization: No - Comment as needed  Activities of Daily Living Home Assistive Devices/Equipment: Grab bars around toilet, Shower chair without back, Walker (specify type) ADL Screening (condition at time of admission) Patient's cognitive ability adequate to safely complete daily activities?:  Yes Is the patient deaf or have difficulty hearing?: No Does the patient have difficulty seeing, even when wearing glasses/contacts?: No Does the patient have difficulty concentrating, remembering, or making decisions?: Yes Patient able to express need for assistance with ADLs?: Yes Does the patient have difficulty dressing or bathing?: No Independently performs ADLs?: Yes (appropriate for developmental age) Does the patient have difficulty walking or climbing stairs?: Yes Weakness of Legs: Both Weakness of Arms/Hands: None  Permission Sought/Granted                  Emotional Assessment       Orientation: : Oriented to Self, Oriented to Place, Oriented to  Time, Oriented to Situation Alcohol / Substance Use: Not Applicable Psych Involvement: No (comment)  Admission diagnosis:  SOB (shortness of breath) [R06.02] GI bleed [K92.2] Patient Active Problem List   Diagnosis Date Noted   Acute upper GI bleed 03/13/2021   GI bleed 03/13/2021   Preventive measure 01/14/2021   Orthostatic hypotension 12/21/2020   Memory change 11/13/2016   Acute bronchitis 03/23/2016   Chest pain with high risk for cardiac etiology 11/05/2015   Essential hypertension 08/25/2015   Sick sinus syndrome ([place]) 08/16/2015   Syncope and collapse 08/10/2015   CAD, multiple vessel    Chest pain    Cardiac pacemaker in situ 07/25/2015   Angina pectoris ([place]) 03/24/2015   Unstable angina ([place])  10/26/2014   Seizures ([place]) 02/19/2014   Lung nodule 02/06/2013   Thyroid nodule 02/06/2013   History of colon cancer 12/04/2011   GERD (gastroesophageal reflux disease) 08/17/2011   Monoclonal gammopathy of unknown significance    Hypothyroid    Coronary artery disease, post CABG 2007     Dyslipidemia    PCP:  Harlan Stains, MD Pharmacy:   [facility], [place] [address] [place] [address] Phone: [phone] Fax: [phone]  [facility] [address] [place] [address] Phone: [phone] Fax: [phone]  [id number] [place] [address] [facility]: [phone]: [phone] [facility] [address]: [phone]: [phone]    Social Determinants of Health (SDOH) Interventions    Readmission Risk Interventions No flowsheet data found.

## 2021-03-17 NOTE — Progress Notes (Signed)
Cardiology Progress Note  Patient ID: Rachel Clark MRN: [id number] DOB: [birth year] Date of Encounter: 03/17/2021  Primary Cardiologist: Peter [name], MD  Subjective   Chief Complaint: None.  HPI: Denies chest pain or trouble breathing.  Hemoglobin value has stabilized.  Plan for discharge today.  Follow-up as already been scheduled.  ROS:  All other ROS reviewed and negative. Pertinent positives noted in the HPI.     Inpatient Medications  Scheduled Meds:  aspirin  81 mg Oral Daily   carbamazepine  600 mg Oral BID   DULoxetine  60 mg Oral Daily   ethosuximide  500 mg Oral BID   ezetimibe  10 mg Oral Daily   levothyroxine  50 mcg Oral Daily   memantine  10 mg Oral BID   pantoprazole  40 mg Intravenous Q12H   potassium chloride  30 mEq Oral Q3H   prasugrel  10 mg Oral Daily   Continuous Infusions:  pantoprazole 8 mg/hr (03/17/21 0247)   PRN Meds: acetaminophen, azelastine, hydrALAZINE, LORazepam, ondansetron **OR** ondansetron (ZOFRAN) IV, tiZANidine   Vital Signs   Vitals:   03/16/21 2026 03/16/21 2358 03/17/21 0355 03/17/21 0743  BP: (!) 111/57 (!) 115/47 100/62 (!) 122/57  Pulse: (!) 102 89 93   Resp: '20 20 20 19  '$ Temp: 98.2 F (36.8 C) (!) 97.5 F (36.4 C) 97.6 F (36.4 C) 98.2 F (36.8 C)  TempSrc: Oral Oral Oral Oral  SpO2: 97% 100% 97%   Weight:      Height:        Intake/Output Summary (Last 24 hours) at 03/17/2021 0856 Last data filed at 03/16/2021 2000 Gross per 24 hour  Intake 1107 ml  Output 200 ml  Net 907 ml   Last 3 Weights 03/14/2021 03/08/2021 01/14/2021  Weight (lbs) 168 lb 170 lb 173 lb 9.6 oz  Weight (kg) 76.204 kg 77.111 kg 78.744 kg      Telemetry  Overnight telemetry shows sinus rhythm heart rate in 70s, which I personally reviewed.   Physical Exam   Vitals:   03/16/21 2026 03/16/21 2358 03/17/21 0355 03/17/21 0743  BP: (!) 111/57 (!) 115/47 100/62 (!) 122/57  Pulse: (!) 102 89 93   Resp: '20 20 20 19  '$ Temp: 98.2 F (36.8  C) (!) 97.5 F (36.4 C) 97.6 F (36.4 C) 98.2 F (36.8 C)  TempSrc: Oral Oral Oral Oral  SpO2: 97% 100% 97%   Weight:      Height:        Intake/Output Summary (Last 24 hours) at 03/17/2021 0856 Last data filed at 03/16/2021 2000 Gross per 24 hour  Intake 1107 ml  Output 200 ml  Net 907 ml    Last 3 Weights 03/14/2021 03/08/2021 01/14/2021  Weight (lbs) 168 lb 170 lb 173 lb 9.6 oz  Weight (kg) 76.204 kg 77.111 kg 78.744 kg    Body mass index is 24.81 kg/m.  General: Well nourished, well developed, in no acute distress Head: Atraumatic, normal size  Eyes: PEERLA, EOMI  Neck: Supple, no JVD Endocrine: No thryomegaly Cardiac: Normal S1, S2; RRR; no murmurs, rubs, or gallops Lungs: Clear to auscultation bilaterally, no wheezing, rhonchi or rales  Abd: Soft, nontender, no hepatomegaly  Ext: No edema, pulses 2+ Musculoskeletal: No deformities, BUE and BLE strength normal and equal Skin: Warm and dry, no rashes   Neuro: Alert and oriented to person, place, time, and situation, CNII-XII grossly intact, no focal deficits  Psych: Normal mood and affect   Labs  High Sensitivity Troponin:   Recent Labs  Lab 03/08/21 1155 03/08/21 1404  TROPONINIHS 39* 330*     Cardiac EnzymesNo results for input(s): TROPONINI in the last 168 hours. No results for input(s): TROPIPOC in the last 168 hours.  Chemistry Recent Labs  Lab 03/13/21 0927 03/13/21 1013 03/15/21 0313 03/16/21 0533 03/17/21 0550  NA 134*   < > 133* 134* 133*  K 4.9   < > 3.3* 3.5 3.1*  CL 103   < > 104 99 99  CO2 23  --  '22 24 25  '$ GLUCOSE 127*   < > 113* 106* 125*  BUN 39*   < > 25* 12 16  CREATININE 0.68   < > 0.62 0.71 0.68  CALCIUM 8.4*  --  8.0* 8.2* 7.9*  PROT 6.5  --   --   --   --   ALBUMIN 3.3*  --   --   --   --   AST 17  --   --   --   --   ALT 14  --   --   --   --   ALKPHOS 43  --   --   --   --   BILITOT 0.5  --   --   --   --   GFRNONAA >60  --  >60 >60 >60  ANIONGAP 8  --  '7 11 9   '$ < > = values  in this interval not displayed.    Hematology Recent Labs  Lab 03/15/21 0313 03/15/21 1656 03/16/21 0533 03/16/21 1835 03/17/21 0550  WBC 10.9*  --  9.9  --  10.0  RBC 2.08*  --  2.82*  --  3.13*  HGB 6.5*   < > 8.7* 10.8* 9.6*  HCT 18.5*   < > 23.9* 30.1* 27.5*  MCV 88.9  --  84.8  --  87.9  MCH 31.3  --  30.9  --  30.7  MCHC 35.1  --  36.4*  --  34.9  RDW 13.8  --  14.6  --  14.6  PLT 223  --  203  --  221   < > = values in this interval not displayed.   BNPNo results for input(s): BNP, PROBNP in the last 168 hours.  DDimer No results for input(s): DDIMER in the last 168 hours.   Radiology  NM GI Blood Loss  Result Date: 03/15/2021 CLINICAL DATA:  Melena. EXAM: NUCLEAR MEDICINE GASTROINTESTINAL BLEEDING SCAN TECHNIQUE: Sequential abdominal images were obtained following intravenous administration of Tc-47mlabeled red blood cells. RADIOPHARMACEUTICALS:  24.7 mCi Tc-984mertechnetate in-vitro labeled red cells. COMPARISON:  Nov 03, 2014. FINDINGS: Normal uptake is noted in the liver and urinary bladder and vasculature. No definite abnormal uptake is noted to suggest active gastrointestinal bleeding. IMPRESSION: No definite scintigraphic evidence of gastrointestinal bleeding. Electronically Signed   By: JaMarijo Conception.D.   On: 03/15/2021 15:47    Cardiac Studies  LHC 03/08/2021 Severe 3 vessel obstructive CAD Patent LIMA to the LAD Known occlusion of SVG to RCA Continued patency of stents in the RCA De novo high grade disease in the proximal and mid ramus intermediate. This is the culprit Normal LV function Normal LVEDP Successful PCI of the Ramus intermediate with DES x 3 in overlapping fashion.  Patient Profile  Rachel RESSLERs a [age].o. female with CAD status post CABG with recent PCI to the ramus on 03/08/2021, complete heart block status  post permanent pacemaker implantation, hypertension, hyperlipidemia, orthostatic hypotension who was admitted on 03/13/2021 with  acute blood loss anemia secondary to likely GI bleed.  Assessment & Plan   #Upper GI bleed #Acute blood loss anemia Patient had recent PCI -Recent PCI to the ramus intermedius on 03/08/2021.  Had several stents. -Admitted with GI bleed. -We were unable to stop DAPT. -Found to have bleeding vessel that is undergone clipping in the stomach.  Nuclear medicine bleeding test is negative.  Hemoglobin is stable. -Seems to be stable for discharge from hospital medicine standpoint.  Would continue with DAPT. -Okay to restart home beta-blocker as able.  We will continue her home lipid-lowering agents.  [facility] will sign off.   Medication Recommendations: Continue medications as above. Other recommendations (labs, testing, etc): None. Follow up as an outpatient: She has a follow-up with Dr. Peter [name] later this month.  She should keep this.  For questions or updates, please contact [place] Please consult [email or web address] for contact info under   Time Spent with Patient: I have spent a total of 25 minutes with patient reviewing hospital notes, telemetry, EKGs, labs and examining the patient as well as establishing an assessment and plan that was discussed with the patient.  > 50% of time was spent in direct patient care.    Signed, Addison Naegeli. Audie Box, MD, [place]  03/17/2021 8:56 AM

## 2021-03-17 NOTE — Progress Notes (Signed)
Subjective: As per patient's husband she was very confused between 10:30 PM to 11 PM yesterday and called him at least 6 times. It appears she did not have a very restful sleep. Today morning she appears comfortable. She has not clear where she is however otherwise answers appropriately. No bowel movement reported yesterday, none today morning.  Objective: Vital signs in last 24 hours: Temp:  [97.3 F (36.3 C)-98.2 F (36.8 C)] 98.2 F (36.8 C) (09/15 0743) Pulse Rate:  [77-102] 93 (09/15 0355) Resp:  [16-20] 19 (09/15 0743) BP: (100-131)/(47-62) 122/57 (09/15 0743) SpO2:  [97 %-100 %] 97 % (09/15 0355) Weight change:  Last BM Date: 03/13/21  PE: Slightly confused GENERAL: Mild pallor, not in distress, able to speak in full sentences  ABDOMEN: Nondistended, nontender, normoactive bowel sounds EXTREMITIES: No deformity, no edema  Lab Results: Results for orders placed or performed during the hospital encounter of 03/13/21 (from the past 48 hour(s))  Hemoglobin and hematocrit, blood     Status: Abnormal   Collection Time: 03/15/21  4:56 PM  Result Value Ref Range   Hemoglobin 9.4 (L) 12.0 - 15.0 g/dL    Comment: REPEATED TO VERIFY POST TRANSFUSION SPECIMEN    HCT 26.1 (L) 36.0 - 46.0 %    Comment: Performed at [facility], [address] [place], [place] [address]  Hemoglobin and hematocrit, blood     Status: Abnormal   Collection Time: 03/15/21  9:23 PM  Result Value Ref Range   Hemoglobin 8.6 (L) 12.0 - 15.0 g/dL   HCT 24.1 (L) 36.0 - 46.0 %    Comment: Performed at [facility] [address] [place], [place] [id number]  Basic metabolic panel     Status: Abnormal   Collection Time: 03/16/21  5:33 AM  Result Value Ref Range   Sodium 134 (L) 135 - 145 mmol/L   Potassium 3.5 3.5 - 5.1 mmol/L   Chloride 99 98 - 111 mmol/L   CO2 24 22 - 32 mmol/L   Glucose, Bld 106 (H) 70 - 99 mg/dL    Comment: Glucose reference range applies only to samples taken after  fasting for at least 8 hours.   BUN 12 8 - 23 mg/dL   Creatinine, Ser 0.71 0.44 - 1.00 mg/dL   Calcium 8.2 (L) 8.9 - 10.3 mg/dL   GFR, Estimated >60 >60 mL/min    Comment: (NOTE) Calculated using the CKD-EPI Creatinine Equation (2021)    Anion gap 11 5 - 15    Comment: Performed at [facility] [address], [place], [place] [address]  CBC     Status: Abnormal   Collection Time: 03/16/21  5:33 AM  Result Value Ref Range   WBC 9.9 4.0 - 10.5 K/uL   RBC 2.82 (L) 3.87 - 5.11 MIL/uL   Hemoglobin 8.7 (L) 12.0 - 15.0 g/dL   HCT 23.9 (L) 36.0 - 46.0 %   MCV 84.8 80.0 - 100.0 fL   MCH 30.9 26.0 - 34.0 pg   MCHC 36.4 (H) 30.0 - 36.0 g/dL   RDW 14.6 11.5 - 15.5 %   Platelets 203 150 - 400 K/uL   nRBC 0.0 0.0 - 0.2 %    Comment: Performed at [facility], [place] [address], [place], [place] [address]  Prepare RBC (crossmatch)     Status: None   Collection Time: 03/16/21 10:56 AM  Result Value Ref Range   Order Confirmation  ORDER PROCESSED BY BLOOD BANK Performed at [facility], [place] [address] [place], [place] [address]   Hemoglobin and hematocrit, blood     Status: Abnormal   Collection Time: 03/16/21  6:35 PM  Result Value Ref Range   Hemoglobin 10.8 (L) 12.0 - 15.0 g/dL   HCT 30.1 (L) 36.0 - 46.0 %    Comment: Performed at [facility], [place] [address], [place], [place] [id number]  Basic metabolic panel     Status: Abnormal   Collection Time: 03/17/21  5:50 AM  Result Value Ref Range   Sodium 133 (L) 135 - 145 mmol/L   Potassium 3.1 (L) 3.5 - 5.1 mmol/L   Chloride 99 98 - 111 mmol/L   CO2 25 22 - 32 mmol/L   Glucose, Bld 125 (H) 70 - 99 mg/dL    Comment: Glucose reference range applies only to samples taken after fasting for at least 8 hours.   BUN 16 8 - 23 mg/dL   Creatinine, Ser 0.68 0.44 - 1.00 mg/dL   Calcium 7.9 (L) 8.9 - 10.3 mg/dL   GFR, Estimated >60 >60 mL/min    Comment: (NOTE) Calculated using the CKD-EPI Creatinine Equation (2021)     Anion gap 9 5 - 15    Comment: Performed at [facility] [address] [place], [place] [address]  CBC     Status: Abnormal   Collection Time: 03/17/21  5:50 AM  Result Value Ref Range   WBC 10.0 4.0 - 10.5 K/uL   RBC 3.13 (L) 3.87 - 5.11 MIL/uL   Hemoglobin 9.6 (L) 12.0 - 15.0 g/dL   HCT 27.5 (L) 36.0 - 46.0 %   MCV 87.9 80.0 - 100.0 fL   MCH 30.7 26.0 - 34.0 pg   MCHC 34.9 30.0 - 36.0 g/dL   RDW 14.6 11.5 - 15.5 %   Platelets 221 150 - 400 K/uL   nRBC 0.0 0.0 - 0.2 %    Comment: Performed at [facility], [place] [address] [place], [place] [address]    Studies/Results: NM GI Blood Loss  Result Date: 03/15/2021 CLINICAL DATA:  Melena. EXAM: NUCLEAR MEDICINE GASTROINTESTINAL BLEEDING SCAN TECHNIQUE: Sequential abdominal images were obtained following intravenous administration of Tc-33mlabeled red blood cells. RADIOPHARMACEUTICALS:  24.7 mCi Tc-998mertechnetate in-vitro labeled red cells. COMPARISON:  Nov 03, 2014. FINDINGS: Normal uptake is noted in the liver and urinary bladder and vasculature. No definite abnormal uptake is noted to suggest active gastrointestinal bleeding. IMPRESSION: No definite scintigraphic evidence of gastrointestinal bleeding. Electronically Signed   By: JaMarijo Conception.D.   On: 03/15/2021 15:47    Medications: I have reviewed the patient's current medications.  Assessment: Drug-eluting stent placed on 03/08/2021 continued on dual antiplatelet therapy with Effient and aspirin Bleeding from antral area, treated with epinephrine and Endo Clip placement, continued on IV Protonix drip Hemoglobin yesterday was 8.7, after 1 unit increased to 10.8(likely false elevation) and is at 9.6 today, which would be an appropriate increase after 1 unit transfusion. BUN has normalized to 16, no further episodes of melena or coffee-ground emesis. Tolerating regular diet  Plan: Okay to DC home from GI standpoint. Please discharge patient on pantoprazole 40 mg  twice a day for 2 months, then to continue pantoprazole 40 mg daily indefinitely as long as she is on aspirin.  ArRonnette JuniperMD 03/17/2021, 8:50 AM

## 2021-03-17 NOTE — Progress Notes (Signed)
Husband called and was updated about how patient's night went.

## 2021-03-18 ENCOUNTER — Telehealth: Payer: Self-pay | Admitting: Cardiology

## 2021-03-18 NOTE — Telephone Encounter (Signed)
Husband of the patient called. He wanted to know if the patient needs to have repeat labs done before her upcoming appt 03/28/21.   The patient was recently hospitalized and had a blood transfusion. Please advise

## 2021-03-18 NOTE — Telephone Encounter (Signed)
Discharged from hospital 9/15:  Recommendations for Outpatient Follow-up:  Follow up with PCP and GI in 1-2 weeks Please obtain CBC in one week  Returned call to husband-he is awaiting a call back from PCP and GI for follow up appointments.   Advised if appointments are after 9/26 (appt with Dr. [name]) to let us know, labs could be ordered.  If patient sees PCP or GI next week and labs are completed, no need to repeat with Dr. [name].    Husband aware and verbalized understanding.

## 2021-03-22 ENCOUNTER — Other Ambulatory Visit (HOSPITAL_COMMUNITY): Payer: Self-pay

## 2021-03-22 ENCOUNTER — Telehealth (HOSPITAL_COMMUNITY): Payer: Self-pay

## 2021-03-22 NOTE — Telephone Encounter (Signed)
Transitions of Care Pharmacy   Call attempted for a pharmacy transitions of care follow-up. Unable to leave voicemail.  Call attempt #1. Will follow-up in 2-3 days.    

## 2021-03-23 ENCOUNTER — Telehealth: Payer: Self-pay | Admitting: Cardiology

## 2021-03-23 ENCOUNTER — Other Ambulatory Visit: Payer: Self-pay

## 2021-03-23 ENCOUNTER — Telehealth (HOSPITAL_COMMUNITY): Payer: Self-pay

## 2021-03-23 DIAGNOSIS — D649 Anemia, unspecified: Secondary | ICD-10-CM

## 2021-03-23 NOTE — Telephone Encounter (Signed)
Will route to MD if okay to order blood work to check her hemoglobin. Patient recently had blood on 15th, would like a recheck.  Thanks!

## 2021-03-23 NOTE — Telephone Encounter (Signed)
Transitions of Care Pharmacy   Call attempted for a pharmacy transitions of care follow-up. HIPAA appropriate voicemail was left with call back information provided.   Call attempt #2. Will follow-up in 2-3 days.    

## 2021-03-23 NOTE — Telephone Encounter (Signed)
Roe's husband is calling requesting an order be placed for Rachel Clark to have her hemoglobin levels checked. Would like a callback to inform him once order is placed.

## 2021-03-23 NOTE — Telephone Encounter (Signed)
OK to repeat CBC. Recent GI bleed on DAPT  Shalissa Easterwood [name] MD, [facility]

## 2021-03-23 NOTE — Telephone Encounter (Signed)
I called patient, LVM advising that lab was ordered and they could get this drawn.  Patient advised to call back if questions/concerns.

## 2021-03-24 ENCOUNTER — Telehealth (HOSPITAL_COMMUNITY): Payer: Self-pay

## 2021-03-24 DIAGNOSIS — I951 Orthostatic hypotension: Secondary | ICD-10-CM | POA: Diagnosis not present

## 2021-03-24 DIAGNOSIS — E876 Hypokalemia: Secondary | ICD-10-CM | POA: Diagnosis not present

## 2021-03-24 DIAGNOSIS — I2510 Atherosclerotic heart disease of native coronary artery without angina pectoris: Secondary | ICD-10-CM | POA: Diagnosis not present

## 2021-03-24 DIAGNOSIS — K922 Gastrointestinal hemorrhage, unspecified: Secondary | ICD-10-CM | POA: Diagnosis not present

## 2021-03-24 DIAGNOSIS — G40909 Epilepsy, unspecified, not intractable, without status epilepticus: Secondary | ICD-10-CM | POA: Diagnosis not present

## 2021-03-24 DIAGNOSIS — D509 Iron deficiency anemia, unspecified: Secondary | ICD-10-CM | POA: Diagnosis not present

## 2021-03-24 NOTE — Telephone Encounter (Signed)
Transitions of Care Pharmacy   Call attempted for a pharmacy transitions of care follow-up. Unable to leave voicemail.   Call attempt #3. Will no longer follow up for [facility] pharmacy

## 2021-03-25 NOTE — Progress Notes (Signed)
Cardiology Office Note   Date:  03/28/2021   ID:  [name], DOB 02/19/1951, MRN [id number]  PCP:  Harlan Stains, MD  Cardiologist:   Josey Dettmann [name], MD   Chief Complaint  Patient presents with   Coronary Artery Disease        History of Present Illness: Rachel Clark is a 70 y.o. female is seen for follow up CAD. 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 Subsequent failure of SVG to RCA. S/p extensive stenting of RCA in the past- last intervention in 2017 with PTCA of ostial RCA for restenosis. In August 2019 stented RCA was widely patent.    Since November 2021 she has had multiple episodes of dizziness and syncope. She was found to be orthostatic. Seen in our clinic and later by Dr Rayann Heman. Pacemaker function normal. Imdur was discontinued. Hydration seemed to improve symptoms. Echo was unremarkable.   Was seen in the ED on 09/04/20 with recurrent syncope after standing. She was orthostatic. Labs were unremarkable except mildly decreased sodium. No changes made at that time. Seen again in ED on May 11 with fall. Labs ok except low sodium level, pacer check and CXR Ok. Again hydrated. Her husband reports she passed out again on Father's day. Really doesn't have any warning. Is out only a few seconds. She has liberalized sodium intake. Wearing compression hose. Husband reports she naps 60% of the day. Seen recently by Dr Jannifer Franklin. No seizures noted. Husband has kept a BP record. Sometimes has a 20 point drop in BP with standing but at other times no change. On her last visit we stopped her amlodipine.   She was admitted 03/08/21 with a NSTEMI. Ecg showed diffuse ST depression. Troponin peaked at 39. She underwent repeat cardiac cath showing patent LIMA to the LAD and patent stents in the RCA. There was new disease in a large ramus branch with  sequential high grade stenoses. She had PCI which was difficult due to tortuosity and calcification of the vessel making deliverablility of stents difficult. The disease was successfully covered with 3 DES. She was placed on Effient due to interaction of Brilinta with Tegretol. DC the following day. She has a history of rash on Plavix. Indefinite DAPT recommended due to extensive stents. Hgb at time of DC was 12.1.   She was readmitted on 03/13/21 with acute upper GI bleed with coffee ground emesis. Hgb dropped to 7.7 then to 6.5. she was transfused multiple units of PRBCs. DAPT was continued given recent stents. EGD showed a visible vessel in the gastric antrum treated with epinephrine injection and clipping. Treated with PPI. Bleeding scan was subsequently negative. Hgb improved to 9.6 at discharge.   On follow up she is seen with her husband. She is doing much better. No evidence of recurrent bleeding. Stools are normal. She has no recurrent chest pain. Is tolerating medication well. Repeat Hgb on 9/22 was 12.2. She still has some lightheadedness but it is better. Still wears compression hose. BP does fluctuate.   Past Medical History:  Diagnosis Date   Arthritis    "fingers" (06/29/2016)   Chronic lower back pain    Colon cancer ([place]) 12/04/2011   s/p Laparoscopic-assisted transverse colectomy on 12/19/2011 by Dr. Donne Hazel.  pT3 N0 M0.    Coronary artery disease    a. remote NSTEMI with PCI to the  RCA; b. S/P CABG in 2007; c. 2010 - 2 v CAD with L-LAD and S-RCA patent, small caliber Dx 70% (treated medically - no amenable to PCI), CFX free of significant disease, normal LVF. d. 10/2014 Lexi MV: EF 61%, no ischemia/infarct.   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, controlled ([place]) 12/06/2011   last seizure was in 1972 ;takes Tegretol (06/29/2016)    Headache    "weekly" (06/29/2016)   Hyperlipidemia     Hypothyroid    Iron deficiency anemia 11/17/2011   Memory change 11/13/2016   Monoclonal gammopathy of unknown significance    Orthostatic hypotension 12/21/2020   Presence of permanent cardiac pacemaker    Syncope and collapse    pacemaker implanted    Past Surgical History:  Procedure Laterality Date   ANTERIOR CERVICAL DECOMP/DISCECTOMY FUSION  1980's   BACK SURGERY     CARDIAC CATHETERIZATION  05/20/2009   obstructive native vessel disease in LAD, RCA, and first diagonal, patent vein graft to distal RCA and LIMA to LAD,normal. ef 60%   CARDIAC CATHETERIZATION N/A 03/24/2015   Procedure: Left Heart Cath and Cors/Grafts Angiography;  Surgeon: Wellington Hampshire, MD;  Location: [facility];  Service: Cardiovascular;  Laterality: N/A;   CARDIAC CATHETERIZATION N/A 07/28/2015   Procedure: Left Heart Cath and Coronary Angiography;  Surgeon: Troy Sine, MD;  Location: [facility];  Service: Cardiovascular;  Laterality: N/A;   CARDIAC CATHETERIZATION N/A 11/09/2015   Procedure: Coronary Stent Intervention;  Surgeon: Sherren Mocha, MD;  Location: [facility];  Service: Cardiovascular;  Laterality: N/A;   CARDIAC CATHETERIZATION N/A 11/09/2015   Procedure: Left Heart Cath and Coronary Angiography;  Surgeon: Sherren Mocha, MD;  Location: [facility];  Service: Cardiovascular;  Laterality: N/A;   CARDIAC CATHETERIZATION N/A 06/29/2016   Procedure: Left Heart Cath and Coronary Angiography;  Surgeon: Nelva Bush, MD;  Location: [facility];  Service: Cardiovascular;  Laterality: N/A;   CARDIAC CATHETERIZATION N/A 06/29/2016   Procedure: Intravascular Pressure Wire/FFR Study;  Surgeon: Nelva Bush, MD;  Location: [facility];  Service: Cardiovascular;  Laterality: N/A;   CARDIAC CATHETERIZATION N/A 06/29/2016   Procedure: Coronary Balloon Angioplasty;  Surgeon: Nelva Bush, MD;  Location: [facility];  Service: Cardiovascular;  Laterality:  N/A;   COLON RESECTION  12/19/2011   Procedure: COLON RESECTION LAPAROSCOPIC;  Surgeon: Rolm Bookbinder, MD;  Location: [facility];  Service: General;  Laterality: N/A;  laparoscopic hand assisted partial colon resection   COLON SURGERY     COLONOSCOPY     CORONARY ANGIOPLASTY WITH STENT PLACEMENT  ~ 2007   1 stent   CORONARY ARTERY BYPASS GRAFT  2007   "CABG X2"   DILATION AND CURETTAGE OF UTERUS  1973   EP IMPLANTABLE DEVICE N/A 03/25/2015   MDT Advisa DR pacemaker implanted by Dr Rayann Heman for transient complete heart block and syncope   ESOPHAGOGASTRODUODENOSCOPY (EGD) WITH PROPOFOL N/A 03/14/2021   Procedure: ESOPHAGOGASTRODUODENOSCOPY (EGD) WITH PROPOFOL;  Surgeon: Ronnette Juniper, MD;  Location: [facility];  Service: Gastroenterology;  Laterality: N/A;   HEMOSTASIS CLIP PLACEMENT  03/14/2021   Procedure: HEMOSTASIS CLIP PLACEMENT;  Surgeon: Ronnette Juniper, MD;  Location: [facility];  Service: Gastroenterology;;   INSERT / REPLACE / [place] CATH AND CORS/GRAFTS ANGIOGRAPHY N/A 02/21/2018   Procedure:  LEFT HEART CATH AND CORS/GRAFTS ANGIOGRAPHY;  Surgeon: Lorretta Harp, MD;  Location: [facility];  Service: Cardiovascular;  Laterality: N/A;   LEFT HEART CATH AND CORS/GRAFTS ANGIOGRAPHY N/A 03/08/2021   Procedure: LEFT HEART CATH AND CORS/GRAFTS ANGIOGRAPHY;  Surgeon: [name], Garik Diamant M, MD;  Location: [facility];  Service: Cardiovascular;  Laterality: N/A;   PORT-A-CATH REMOVAL N/A 06/12/2013   Procedure: REMOVAL PORT-A-CATH;  Surgeon: Rolm Bookbinder, MD;  Location: [facility];  Service: General;  Laterality: N/A;   PORTACATH PLACEMENT  06/04/2012   Procedure: INSERTION PORT-A-CATH;  Surgeon: Rolm Bookbinder, MD;  Location: [facility];  Service: General;  Laterality: N/A;  Insertion of port-a-cath    POSTERIOR LAMINECTOMY / [place]  03/14/2021   Procedure: SCLEROTHERAPY;  Surgeon: Ronnette Juniper, MD;   Location: [facility];  Service: Gastroenterology;;   VAGINAL HYSTERECTOMY       Current Outpatient Medications  Medication Sig Dispense Refill   acetaminophen (TYLENOL) 500 MG tablet Take 500-1,000 mg by mouth daily as needed for mild pain or headache.     aspirin 81 MG chewable tablet Chew 1 tablet (81 mg total) by mouth daily.     Azelastine HCl 0.15 % SOLN Place 1 spray into both nostrils daily as needed (seasonal allergies).   12   Bempedoic Acid-Ezetimibe (NEXLIZET) 180-10 MG TABS Take 180 mg by mouth daily. 90 tablet 3   calcium carbonate (TUMS - DOSED IN MG ELEMENTAL CALCIUM) 500 MG chewable tablet Chew 1 tablet by mouth daily.     carbamazepine (TEGRETOL XR) 200 MG 12 hr tablet Take 3 tablets (600 mg total) by mouth 2 (two) times daily. 180 tablet 11   carvedilol (COREG) 3.125 MG tablet Take 1 tablet (3.125 mg total) by mouth 2 (two) times daily. 180 tablet 3   Cholecalciferol (VITAMIN D) 50 MCG (2000 UT) tablet Take 2,000 Units by mouth daily.     DULoxetine (CYMBALTA) 60 MG capsule Take 60 mg by mouth daily.      ethosuximide (ZARONTIN) 250 MG capsule TAKE 2 CAPSULES IN THE MORNING, TAKE 2 CAPSULES AT NIGHT 360 capsule 3   levothyroxine (SYNTHROID, LEVOTHROID) 50 MCG tablet Take 50 mcg by mouth daily.     memantine (NAMENDA) 10 MG tablet Take 1 tablet (10 mg total) by mouth 2 (two) times daily. 180 tablet 3   nitroGLYCERIN (NITROSTAT) 0.4 MG SL tablet Place 1 tablet (0.4 mg total) under the tongue every 5 (five) minutes as needed. For chest pain. 25 tablet 11   pantoprazole (PROTONIX) 40 MG tablet Take 1 tablet (40 mg total) by mouth 2 (two) times daily. AFTER 2 MONTHS CONT AS ONCE A DAY 60 tablet 1   prasugrel (EFFIENT) 10 MG TABS tablet Take 1 tablet (10 mg total) by mouth daily. 90 tablet 3   tiZANidine (ZANAFLEX) 2 MG tablet Take by mouth every 6 (six) hours as needed for muscle spasms.     No current facility-administered medications for this visit.    Allergies:    Aspirin, Capecitabine, Crestor [rosuvastatin], Erythromycin, Fluticasone, Lipitor [atorvastatin], Lisinopril, Niacin and related, Statins, Sulfa drugs cross reactors, Sulfamethoxazole, Tetracyclines & related, Penicillins, and Plavix [clopidogrel bisulfate]    Social History:  The patient  reports that she quit smoking about 39 years ago. Her smoking use included cigarettes. She has a 6.00 pack-year smoking history. She has never used smokeless tobacco. She reports current alcohol use of about 1.0 standard drink per  week. She reports that she does not use drugs.   Family History:  The patient's family history includes Cancer in her mother; Heart attack in her father; Heart attack (age of onset: 63) in her brother; Heart disease in her father.    ROS:  Please see the history of present illness.   Otherwise, review of systems are positive for none.   All other systems are reviewed and negative.    PHYSICAL EXAM: VS:  BP 116/60 (BP Location: Left Arm, Patient Position: Sitting, Cuff Size: Normal)   Pulse 70   Ht 5\' 9"  (1.753 m)   Wt 173 lb 3.2 oz (78.6 kg)   SpO2 98%   BMI 25.58 kg/m  , BMI Body mass index is 25.58 kg/m.   GEN: Well nourished, well developed, in no acute distress  HEENT: normal  Neck: no JVD, carotid bruits, or masses Cardiac: RRR; no murmurs, rubs, or gallops,no edema  Respiratory:  clear to auscultation bilaterally, normal work of breathing GI: soft, nontender, nondistended, + BS MS: no deformity or atrophy. No groin hematoma. Skin: warm and dry, no rash Neuro:  Strength and sensation are intact Psych: euthymic mood, full affect   EKG:  EKG is not ordered today. The ekg ordered today demonstrates N/A   Recent Labs: 07/12/2020: TSH 1.090 03/13/2021: ALT 14 03/17/2021: BUN 16; Creatinine, Ser 0.68; Hemoglobin 9.6; Platelets 221; Potassium 3.9; Sodium 133   Dated 03/24/21: Hgb 12.2. normal BMET.  Lipid Panel    Component Value Date/Time   CHOL 157 03/09/2021  0218   CHOL 176 01/19/2021 1127   TRIG 86 03/09/2021 0218   HDL 68 03/09/2021 0218   HDL 82 01/19/2021 1127   CHOLHDL 2.3 03/09/2021 0218   VLDL 17 03/09/2021 0218   LDLCALC 72 03/09/2021 0218   LDLCALC 52 01/19/2021 1127   Dated 09/03/19: cholesterol 283, triglycerides 238, HDL 73, LDL 166, CMET  Normal.    Wt Readings from Last 3 Encounters:  03/28/21 173 lb 3.2 oz (78.6 kg)  03/14/21 168 lb (76.2 kg)  03/08/21 170 lb (77.1 kg)      Other studies Reviewed: Additional studies/ records that were reviewed today include:   Cardiac cath 02/21/18:  LEFT HEART CATH AND CORS/GRAFTS ANGIOGRAPHY    Conclusion    Previously placed Ost RCA to Mid RCA stent (unknown type) is widely patent. Prox LAD lesion is 100% stenosed. Origin lesion is 100% stenosed. The left ventricular systolic function is normal. LV end diastolic pressure is normal. The left ventricular ejection fraction is 55-65% by visual estimate.     Echo 07/30/20: IMPRESSIONS     1. Left ventricular ejection fraction, by estimation, is 60 to 65%. The  left ventricle has normal function. The left ventricle has no regional  wall motion abnormalities. Left ventricular diastolic parameters are  consistent with Grade I diastolic  dysfunction (impaired relaxation).   2. Right ventricular systolic function is normal. The right ventricular  size is normal.   3. The mitral valve is normal in structure. Trivial mitral valve  regurgitation. No evidence of mitral stenosis.   4. The aortic valve is tricuspid. Aortic valve regurgitation is not  visualized. Mild aortic valve sclerosis is present, with no evidence of  aortic valve stenosis.   5. The inferior vena cava is normal in size with greater than 50%  respiratory variability, suggesting right atrial pressure of 3 mmHg.   Comparison(s): Prior images unable to be directly viewed, comparison made  by report only.  No significant change from prior study. 07/26/15 EF 60-65%.   GLS -18.6%.   Cardiac cath 03/08/21:  LEFT HEART CATH AND CORS/GRAFTS ANGIOGRAPHY   Conclusion      Prox LAD lesion is 100% stenosed.   Origin lesion is 100% stenosed.   Ost LAD to Prox LAD lesion is 60% stenosed.   Ost 1st Diag to 1st Diag lesion is 85% stenosed.   Ramus lesion is 99% stenosed.   Ost Ramus to Ramus lesion is 90% stenosed.   Non-stenotic Ost RCA to Mid RCA lesion was previously treated.   A drug-eluting stent was successfully placed using a STENT ONYX FRONTIER 3.0X15.   A drug-eluting stent was successfully placed using a SYNERGY XD 3.0X16.   A drug-eluting stent was successfully placed using a SYNERGY XD 3.0X20.   Post intervention, there is a 0% residual stenosis.   Post intervention, there is a 0% residual stenosis.   The left ventricular systolic function is normal.   LV end diastolic pressure is normal.   The left ventricular ejection fraction is 55-65% by visual estimate.   The radiation dose exceeded thresholds defined in the "Patient Radiation Dose Management For Interventional Medical Procedures With Extensive Use of Fluoroscopy" policy. Specific follow up instructions will be provided to the patient prior to discharge.   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 This is the culprit Normal LV function Normal LVEDP Successful PCI of the Ramus intermediate with DES x 3 in overlapping fashion.   Plan: DAPT for one year. Anticipate possible DC tomorrow.   Diagnostic Dominance: Right Intervention   ASSESSMENT AND PLAN:  1.  Syncope. Secondary to orthostatic hypotension/and/or neurocardiogenic syncope. Pacer evaluation has been normal. Recommend liberalization of sodium in diet. Maintain good hydration. Need to allow more permissive BP. Symptoms improved with cessation of amlodipine.  Compression hose.  Will follow up in 3 months. If symptoms continue may need to stop Coreg as well.  2. CAD. S/p CABG. S/p extensive stenting of RCA.  in 2017. Recent small NSTEMI with complex stenting of a large ramus intermediate branch with DES x 3. Patent LIMA to LAD. Patent stents in RCA. Recommend indefinite DAPT. On ASA and Effient. 3. Hyperlipidemia. LDL goal < 70. Prior  lab showed it was 151. On low dose crestor. Nexlizet  with marked improvement.  4. SSS. S/p pacemaker. Followed in device clinic.  5. Acute upper GI bleed requiring transfusion. S/p clipping of visible vessel in gastric antrum. On PPI. Hgb improved to 12.2. no recurrence. Will need to continue DAPT.  6. History of seizures. Monitored by Dr Jannifer Franklin.  7. Chronic mild hyponatremia.  8. Memory loss.    Current medicines are reviewed at length with the patient today.  The patient does not have concerns regarding medicines.  The following changes have been made:  See above.  Labs/ tests ordered today include: none No orders of the defined types were placed in this encounter.    Disposition:   FU with me or APP in 3 months   Signed, Apoorva Bugay [name], MD  03/28/2021 10:28 AM    [facility] [address] [place], [place], [address] Phone [phone], Fax [phone]

## 2021-03-28 ENCOUNTER — Encounter: Payer: Self-pay | Admitting: Cardiology

## 2021-03-28 ENCOUNTER — Other Ambulatory Visit: Payer: Self-pay

## 2021-03-28 ENCOUNTER — Ambulatory Visit (INDEPENDENT_AMBULATORY_CARE_PROVIDER_SITE_OTHER): Payer: Medicare Other | Admitting: Cardiology

## 2021-03-28 VITALS — BP 116/60 | HR 70 | Ht 69.0 in | Wt 173.2 lb

## 2021-03-28 DIAGNOSIS — I2510 Atherosclerotic heart disease of native coronary artery without angina pectoris: Secondary | ICD-10-CM | POA: Diagnosis not present

## 2021-03-28 DIAGNOSIS — I951 Orthostatic hypotension: Secondary | ICD-10-CM

## 2021-03-28 DIAGNOSIS — I10 Essential (primary) hypertension: Secondary | ICD-10-CM

## 2021-03-28 DIAGNOSIS — I495 Sick sinus syndrome: Secondary | ICD-10-CM | POA: Diagnosis not present

## 2021-03-28 DIAGNOSIS — E782 Mixed hyperlipidemia: Secondary | ICD-10-CM

## 2021-03-28 DIAGNOSIS — D649 Anemia, unspecified: Secondary | ICD-10-CM | POA: Diagnosis not present

## 2021-03-28 NOTE — Addendum Note (Signed)
Addended by: Kathyrn Lass on: 03/28/2021 10:35 AM   Modules accepted: Orders

## 2021-04-04 NOTE — Telephone Encounter (Signed)
Pt husband Jeneen Rinks called to get pt schedule for CR. Kellie Simmering of backlog of 1-3 months and we will be in contact with the pt at a later date to get her schedule.

## 2021-04-06 DIAGNOSIS — Z23 Encounter for immunization: Secondary | ICD-10-CM | POA: Diagnosis not present

## 2021-04-11 ENCOUNTER — Ambulatory Visit (INDEPENDENT_AMBULATORY_CARE_PROVIDER_SITE_OTHER): Payer: Medicare Other

## 2021-04-11 DIAGNOSIS — I495 Sick sinus syndrome: Secondary | ICD-10-CM | POA: Diagnosis not present

## 2021-04-14 ENCOUNTER — Other Ambulatory Visit: Payer: Self-pay | Admitting: Cardiology

## 2021-04-15 LAB — CUP PACEART REMOTE DEVICE CHECK
Battery Remaining Longevity: 65 mo
Battery Voltage: 3 V
Brady Statistic AP VP Percent: 0.01 %
Brady Statistic AP VS Percent: 0.88 %
Brady Statistic AS VP Percent: 0.06 %
Brady Statistic AS VS Percent: 99.06 %
Brady Statistic RA Percent Paced: 0.88 %
Brady Statistic RV Percent Paced: 0.06 %
Date Time Interrogation Session: 20221013162625
Implantable Lead Implant Date: 20160922
Implantable Lead Implant Date: 20160922
Implantable Lead Location: 753859
Implantable Lead Location: 753860
Implantable Lead Model: 5076
Implantable Lead Model: 5076
Implantable Pulse Generator Implant Date: 20160922
Lead Channel Impedance Value: 1368 Ohm
Lead Channel Impedance Value: 1368 Ohm
Lead Channel Impedance Value: 418 Ohm
Lead Channel Impedance Value: 456 Ohm
Lead Channel Pacing Threshold Amplitude: 0.75 V
Lead Channel Pacing Threshold Amplitude: 1 V
Lead Channel Pacing Threshold Pulse Width: 0.4 ms
Lead Channel Pacing Threshold Pulse Width: 0.4 ms
Lead Channel Sensing Intrinsic Amplitude: 2.75 mV
Lead Channel Sensing Intrinsic Amplitude: 2.75 mV
Lead Channel Sensing Intrinsic Amplitude: 7.5 mV
Lead Channel Sensing Intrinsic Amplitude: 7.5 mV
Lead Channel Setting Pacing Amplitude: 2.5 V
Lead Channel Setting Pacing Amplitude: 2.5 V
Lead Channel Setting Pacing Pulse Width: 0.4 ms
Lead Channel Setting Sensing Sensitivity: 2.8 mV

## 2021-04-19 NOTE — Progress Notes (Signed)
Remote pacemaker transmission.   

## 2021-04-22 DIAGNOSIS — K922 Gastrointestinal hemorrhage, unspecified: Secondary | ICD-10-CM | POA: Diagnosis not present

## 2021-04-22 DIAGNOSIS — Z86010 Personal history of colonic polyps: Secondary | ICD-10-CM | POA: Diagnosis not present

## 2021-04-27 DIAGNOSIS — Z20822 Contact with and (suspected) exposure to covid-19: Secondary | ICD-10-CM | POA: Diagnosis not present

## 2021-05-01 ENCOUNTER — Other Ambulatory Visit: Payer: Self-pay | Admitting: Cardiology

## 2021-05-02 ENCOUNTER — Telehealth (HOSPITAL_COMMUNITY): Payer: Self-pay

## 2021-05-02 NOTE — Telephone Encounter (Signed)
Called patient to see if she was interested in participating in the Cardiac Rehab Program. Patient husband stated yes. Patient will come in for orientation on 05/10/2021@11 :30 and will attend the 10:30 exercise class.  Tourist information centre manager.

## 2021-05-09 ENCOUNTER — Telehealth (HOSPITAL_COMMUNITY): Payer: Self-pay | Admitting: *Deleted

## 2021-05-10 ENCOUNTER — Inpatient Hospital Stay (HOSPITAL_COMMUNITY): Admission: RE | Admit: 2021-05-10 | Payer: Medicare Other | Source: Ambulatory Visit

## 2021-05-10 ENCOUNTER — Telehealth (HOSPITAL_COMMUNITY): Payer: Self-pay | Admitting: *Deleted

## 2021-05-10 NOTE — Telephone Encounter (Signed)
Received a call from Mr Burel. Mrs Baria will not be able to attend orientation as she felt unsteady this morning. Mrs Zahm is also having continued periods where she is light headed. I encouraged the patient's and her husband to notify Dr Doug Sou office about her symptoms. Will cancel orientation and exercise appointments. Will reschedule the patient when appropriate.Barnet Pall, RN,BSN 05/10/2021 1:47 PM

## 2021-05-16 ENCOUNTER — Ambulatory Visit (HOSPITAL_COMMUNITY): Payer: Medicare Other

## 2021-05-18 ENCOUNTER — Ambulatory Visit (HOSPITAL_COMMUNITY): Payer: Medicare Other

## 2021-05-20 ENCOUNTER — Ambulatory Visit (HOSPITAL_COMMUNITY): Payer: Medicare Other

## 2021-05-23 ENCOUNTER — Ambulatory Visit (HOSPITAL_COMMUNITY): Payer: Medicare Other

## 2021-05-25 ENCOUNTER — Ambulatory Visit (HOSPITAL_COMMUNITY): Payer: Medicare Other

## 2021-05-25 NOTE — Telephone Encounter (Signed)
Called patient to see if she was interested in rescheduling for Cardiac Rehab Program. Patient's husband stated yes. Patient will come in for orientation on 06/14/2021@9 :56PO and will attend the 1:00pm exercise class.   Tourist information centre manager.

## 2021-05-27 ENCOUNTER — Ambulatory Visit (HOSPITAL_COMMUNITY): Payer: Medicare Other

## 2021-05-30 ENCOUNTER — Ambulatory Visit (HOSPITAL_COMMUNITY): Payer: Medicare Other

## 2021-05-30 DIAGNOSIS — Z0000 Encounter for general adult medical examination without abnormal findings: Secondary | ICD-10-CM | POA: Diagnosis not present

## 2021-05-30 DIAGNOSIS — Z20822 Contact with and (suspected) exposure to covid-19: Secondary | ICD-10-CM | POA: Diagnosis not present

## 2021-05-30 DIAGNOSIS — G40909 Epilepsy, unspecified, not intractable, without status epilepticus: Secondary | ICD-10-CM | POA: Diagnosis not present

## 2021-05-30 DIAGNOSIS — F331 Major depressive disorder, recurrent, moderate: Secondary | ICD-10-CM | POA: Diagnosis not present

## 2021-05-30 DIAGNOSIS — E039 Hypothyroidism, unspecified: Secondary | ICD-10-CM | POA: Diagnosis not present

## 2021-05-30 DIAGNOSIS — D472 Monoclonal gammopathy: Secondary | ICD-10-CM | POA: Diagnosis not present

## 2021-05-30 DIAGNOSIS — E559 Vitamin D deficiency, unspecified: Secondary | ICD-10-CM | POA: Diagnosis not present

## 2021-05-30 DIAGNOSIS — I2510 Atherosclerotic heart disease of native coronary artery without angina pectoris: Secondary | ICD-10-CM | POA: Diagnosis not present

## 2021-05-30 DIAGNOSIS — E2839 Other primary ovarian failure: Secondary | ICD-10-CM | POA: Diagnosis not present

## 2021-05-30 DIAGNOSIS — E785 Hyperlipidemia, unspecified: Secondary | ICD-10-CM | POA: Diagnosis not present

## 2021-05-30 DIAGNOSIS — D509 Iron deficiency anemia, unspecified: Secondary | ICD-10-CM | POA: Diagnosis not present

## 2021-05-30 DIAGNOSIS — I10 Essential (primary) hypertension: Secondary | ICD-10-CM | POA: Diagnosis not present

## 2021-05-30 DIAGNOSIS — G3184 Mild cognitive impairment, so stated: Secondary | ICD-10-CM | POA: Diagnosis not present

## 2021-06-01 ENCOUNTER — Ambulatory Visit (HOSPITAL_COMMUNITY): Payer: Medicare Other

## 2021-06-03 ENCOUNTER — Ambulatory Visit (HOSPITAL_COMMUNITY): Payer: Medicare Other

## 2021-06-06 ENCOUNTER — Ambulatory Visit (HOSPITAL_COMMUNITY): Payer: Medicare Other

## 2021-06-08 ENCOUNTER — Ambulatory Visit (HOSPITAL_COMMUNITY): Payer: Medicare Other

## 2021-06-10 ENCOUNTER — Telehealth (HOSPITAL_COMMUNITY): Payer: Self-pay | Admitting: *Deleted

## 2021-06-10 ENCOUNTER — Ambulatory Visit (HOSPITAL_COMMUNITY): Payer: Medicare Other

## 2021-06-10 NOTE — Telephone Encounter (Signed)
Spoke with [name]. Health history completed appointment confirmed.Barnet Pall, RN,BSN 06/10/2021 4:28 PM

## 2021-06-13 ENCOUNTER — Ambulatory Visit (HOSPITAL_COMMUNITY): Payer: Medicare Other

## 2021-06-14 ENCOUNTER — Encounter (HOSPITAL_COMMUNITY)
Admission: RE | Admit: 2021-06-14 | Discharge: 2021-06-14 | Disposition: A | Discharge status: Home / Self Care | Payer: Medicare Other | Source: Ambulatory Visit | Attending: Cardiology | Admitting: Cardiology

## 2021-06-14 ENCOUNTER — Other Ambulatory Visit: Payer: Self-pay

## 2021-06-14 VITALS — BP 94/60 | HR 96 | Ht 67.5 in | Wt 175.7 lb

## 2021-06-14 DIAGNOSIS — I214 Non-ST elevation (NSTEMI) myocardial infarction: Secondary | ICD-10-CM | POA: Insufficient documentation

## 2021-06-14 DIAGNOSIS — Z955 Presence of coronary angioplasty implant and graft: Secondary | ICD-10-CM | POA: Insufficient documentation

## 2021-06-14 NOTE — Progress Notes (Signed)
Incomplete Session Note  Patient Details  Name: Rachel Clark MRN: [id number] Date of Birth: August 10, 1950 Referring Provider:    Robin Clark did not complete her rehab session.  Patient here for cardiac rehab orientation. Patient reports feeling light headed. Rachel Clark brought a Gatorade with her. Initial vital signs are as follows. Sitting blood pressure 94/60 Heart rate 96. Standing blood pressure 88/60 heart rate 102. I asked Rachel Clark to drink about 8 ounces of Gatorade. Recheck BP 108/58 sitting heart rate 98. Standing blood pressure 104/64 heart rate 109. Rachel Clark continued to report feeling light headed but reported feeling better. Rachel Clark said that she drank about 2 glasses of water prior to coming to cardiac rehab orientation. Telemetry rhythm Sinus. Oxygen saturation 99% on room air. Patient reports that she feels lightheaded often. Medications reviewed. Rachel Clark says she is taking her medications as prescribed. Rachel Clark is here with her husband Rachel Clark. Rachel Bellis NP paged and notified. Discussed with Rachel Clark.Will hold on proceeding with walk test and exercise until after being seen by Rachel Clark on Monday  06/20/21. Rachel Clark and her husband say that she cannot do a lot around the house due to her symptoms and sleeps a lot. Patient and husband agreeable to the plan. Will cancel Monday's appointment and hold exercise/ walk test until cleared by Rachel Clark.Will fax today's vital signs to Rachel Clark  office for review.Rachel Gave RN BSN

## 2021-06-14 NOTE — Progress Notes (Deleted)
Patient here for cardiac rehab orientation. Patient reports feeling light headed. [name] brought a Gatorade with her. Initial vital signs are as follows. Sitting blood pressure 94/60 Heart rate 96. Standing blood pressure 88/60 heart rate 102. I asked [name] to drink about 8 ounces of Gatorade. Recheck BP 108/58 sitting heart rate 98. Standing blood pressure 104/64 heart rate 109. [name] continued to report feeling light headed but reported feeling better. [name] said that she drank about 2 glasses of water prior to coming to cardiac rehab orientation. Telemetry rhythm Sinus. Oxygen saturation 99% on room air. Patient reports that she feels lightheaded often. Medications reviewed. [name] says she is taking her medications as prescribed. [name] is here with her husband Clair Gulling.

## 2021-06-14 NOTE — Progress Notes (Signed)
Cardiac Rehab Medication Review by a Nurse  Does the patient  feel that his/her medications are working for him/her?  YES  Has the patient been experiencing any side effects to the medications prescribed?  YES   Does the patient measure his/her own blood pressure or blood glucose at home?  YES  Does the patient have any problems obtaining medications due to transportation or finances?    NO  Understanding of regimen: good Understanding of indications: good Potential of compliance: excellent    Nurse comments: Syd is taking her medications as prescribed and has a good understanding of what her medications are for. Elenie has been experiencing orthostatic hypotension and is not sure whether its related to her medications or not. Carletha's husband checks her blood pressures daily. Vala wears compression stockings and drinks plenty of water and Gatorade throughout the day.    Harrell Gave RN BSN 06/14/2021 12:00 PM

## 2021-06-15 ENCOUNTER — Ambulatory Visit (HOSPITAL_COMMUNITY): Payer: Medicare Other

## 2021-06-16 NOTE — Progress Notes (Signed)
Cardiology Office Note   Date:  06/20/2021   ID:  HILLARI ZUMWALT, DOB 27-Jan-1951, MRN [id number]  PCP:  Harlan Stains, MD  Cardiologist:   Terius Jacuinde [name], MD   Chief Complaint  Patient presents with   Coronary Artery Disease         History of Present Illness: Rachel Clark is a 70 y.o. female is seen for follow up CAD. 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 Subsequent failure of SVG to RCA. S/p extensive stenting of RCA in the past- last intervention in 2017 with PTCA of ostial RCA for restenosis. In August 2019 stented RCA was widely patent.    Since November 2021 she has had multiple episodes of dizziness and syncope. She was found to be orthostatic. Seen in our clinic and later by Dr Rayann Heman. Pacemaker function normal. Imdur was discontinued. Hydration seemed to improve symptoms. Echo was unremarkable.   Was seen in the ED on 09/04/20 with recurrent syncope after standing. She was orthostatic. Labs were unremarkable except mildly decreased sodium. No changes made at that time. Seen again in ED on May 11 with fall. Labs ok except low sodium level, pacer check and CXR Ok. Again hydrated. Her husband reports she passed out again on Father's day. Really doesn't have any warning. Is out only a few seconds. She has liberalized sodium intake. Wearing compression hose. Husband reports she naps 60% of the day. Seen recently by Dr Jannifer Franklin. No seizures noted. Husband has kept a BP record. Sometimes has a 20 point drop in BP with standing but at other times no change. On her last visit we stopped her amlodipine.   She was admitted 03/08/21 with a NSTEMI. Ecg showed diffuse ST depression. Troponin peaked at 39. She underwent repeat cardiac cath showing patent LIMA to the LAD and patent stents in the RCA. There was new disease in a large ramus branch  with sequential high grade stenoses. She had PCI which was difficult due to tortuosity and calcification of the vessel making deliverablility of stents difficult. The disease was successfully covered with 3 DES. She was placed on Effient due to interaction of Brilinta with Tegretol. DC the following day. She has a history of rash on Plavix. Indefinite DAPT recommended due to extensive stents. Hgb at time of DC was 12.1.   She was readmitted on 03/13/21 with acute upper GI bleed with coffee ground emesis. Hgb dropped to 7.7 then to 6.5. she was transfused multiple units of PRBCs. DAPT was continued given recent stents. EGD showed a visible vessel in the gastric antrum treated with epinephrine injection and clipping. Treated with PPI. Bleeding scan was subsequently negative. Hgb improved to 9.6 at discharge.   On follow up she is seen with her husband. She is doing OK.  No evidence of recurrent bleeding. She has no recurrent chest pain. She does have some days where she is very lightheaded and weak with orthostatic symptoms. Still wears compression hose. Stays hydrated and is liberal with salt intake. BP does fluctuate.   Past Medical History:  Diagnosis Date   Arthritis    "fingers" (06/29/2016)   Chronic lower back pain    Colon cancer ([place]) 12/04/2011   s/p Laparoscopic-assisted transverse colectomy on 12/19/2011 by Dr. Donne Hazel.  pT3 N0 M0.    Coronary artery disease    a. remote NSTEMI with PCI  to the RCA; b. S/P CABG in 2007; c. 2010 - 2 v CAD with L-LAD and S-RCA patent, small caliber Dx 70% (treated medically - no amenable to PCI), CFX free of significant disease, normal LVF. d. 10/2014 Lexi MV: EF 61%, no ischemia/infarct.   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/2011   last seizure was in 1972 ;takes Tegretol (06/29/2016)    Headache    "weekly" (06/29/2016)   Hyperlipidemia     Hypothyroid    Iron deficiency anemia 11/17/2011   Memory change 11/13/2016   Monoclonal gammopathy of unknown significance    Orthostatic hypotension 12/21/2020   Presence of permanent cardiac pacemaker    Syncope and collapse    pacemaker implanted    Past Surgical History:  Procedure Laterality Date   ANTERIOR CERVICAL DECOMP/DISCECTOMY FUSION  1980's   BACK SURGERY     CARDIAC CATHETERIZATION  05/20/2009   obstructive native vessel disease in LAD, RCA, and first diagonal, patent vein graft to distal RCA and LIMA to LAD,normal. ef 60%   CARDIAC CATHETERIZATION N/A 03/24/2015   Procedure: Left Heart Cath and Cors/Grafts Angiography;  Surgeon: Wellington Hampshire, MD;  Location: [facility];  Service: Cardiovascular;  Laterality: N/A;   CARDIAC CATHETERIZATION N/A 07/28/2015   Procedure: Left Heart Cath and Coronary Angiography;  Surgeon: Troy Sine, MD;  Location: [facility];  Service: Cardiovascular;  Laterality: N/A;   CARDIAC CATHETERIZATION N/A 11/09/2015   Procedure: Coronary Stent Intervention;  Surgeon: Sherren Mocha, MD;  Location: [place] [facility];  Service: Cardiovascular;  Laterality: N/A;   CARDIAC CATHETERIZATION N/A 11/09/2015   Procedure: Left Heart Cath and Coronary Angiography;  Surgeon: Sherren Mocha, MD;  Location: [place] [facility];  Service: Cardiovascular;  Laterality: N/A;   CARDIAC CATHETERIZATION N/A 06/29/2016   Procedure: Left Heart Cath and Coronary Angiography;  Surgeon: Nelva Bush, MD;  Location: [place] [facility];  Service: Cardiovascular;  Laterality: N/A;   CARDIAC CATHETERIZATION N/A 06/29/2016   Procedure: Intravascular Pressure Wire/FFR Study;  Surgeon: Nelva Bush, MD;  Location: [place];  Service: Cardiovascular;  Laterality: N/A;   CARDIAC CATHETERIZATION N/A 06/29/2016   Procedure: Coronary Balloon Angioplasty;  Surgeon: Nelva Bush, MD;  Location: [facility];  Service: Cardiovascular;  Laterality:  N/A;   COLON RESECTION  12/19/2011   Procedure: COLON RESECTION LAPAROSCOPIC;  Surgeon: Rolm Bookbinder, MD;  Location: [facility];  Service: General;  Laterality: N/A;  laparoscopic hand assisted partial colon resection   COLON SURGERY     COLONOSCOPY     CORONARY ANGIOPLASTY WITH STENT PLACEMENT  ~ 2007   1 stent   CORONARY ARTERY BYPASS GRAFT  2007   "CABG X2"   DILATION AND CURETTAGE OF UTERUS  1973   EP IMPLANTABLE DEVICE N/A 03/25/2015   MDT Advisa DR pacemaker implanted by Dr Rayann Heman for transient complete heart block and syncope   ESOPHAGOGASTRODUODENOSCOPY (EGD) WITH PROPOFOL N/A 03/14/2021   Procedure: ESOPHAGOGASTRODUODENOSCOPY (EGD) WITH PROPOFOL;  Surgeon: Ronnette Juniper, MD;  Location: [facility];  Service: Gastroenterology;  Laterality: N/A;   HEMOSTASIS CLIP PLACEMENT  03/14/2021   Procedure: HEMOSTASIS CLIP PLACEMENT;  Surgeon: Ronnette Juniper, MD;  Location: [place] [facility] [place];  Service: Gastroenterology;;   INSERT / REPLACE / [place] CATH AND CORS/GRAFTS ANGIOGRAPHY N/A 02/21/2018  Procedure: LEFT HEART CATH AND CORS/GRAFTS ANGIOGRAPHY;  Surgeon: Lorretta Harp, MD;  Location: [facility];  Service: Cardiovascular;  Laterality: N/A;   LEFT HEART CATH AND CORS/GRAFTS ANGIOGRAPHY N/A 03/08/2021   Procedure: LEFT HEART CATH AND CORS/GRAFTS ANGIOGRAPHY;  Surgeon: [name], Vicki Pasqual M, MD;  Location: [facility];  Service: Cardiovascular;  Laterality: N/A;   PORT-A-CATH REMOVAL N/A 06/12/2013   Procedure: REMOVAL PORT-A-CATH;  Surgeon: Rolm Bookbinder, MD;  Location: [facility];  Service: General;  Laterality: N/A;   PORTACATH PLACEMENT  06/04/2012   Procedure: INSERTION PORT-A-CATH;  Surgeon: Rolm Bookbinder, MD;  Location: [facility] [place];  Service: General;  Laterality: N/A;  Insertion of port-a-cath    POSTERIOR LAMINECTOMY / [place]  03/14/2021   Procedure: SCLEROTHERAPY;  Surgeon: Ronnette Juniper, MD;   Location: [place];  Service: Gastroenterology;;   VAGINAL HYSTERECTOMY       Current Outpatient Medications  Medication Sig Dispense Refill   acetaminophen (TYLENOL) 500 MG tablet Take 500-1,000 mg by mouth daily as needed for mild pain or headache.     aspirin 81 MG chewable tablet Chew 1 tablet (81 mg total) by mouth daily.     Bempedoic Acid-Ezetimibe (NEXLIZET) 180-10 MG TABS Take 180 mg by mouth daily. 90 tablet 3   calcium carbonate (TUMS - DOSED IN MG ELEMENTAL CALCIUM) 500 MG chewable tablet Chew 500 mg by mouth daily.     carbamazepine (TEGRETOL XR) 200 MG 12 hr tablet Take 3 tablets (600 mg total) by mouth 2 (two) times daily. 180 tablet 11   Cholecalciferol (VITAMIN D) 50 MCG (2000 UT) CAPS Take 2,000 Units by mouth daily.     DULoxetine (CYMBALTA) 60 MG capsule Take 60 mg by mouth 2 (two) times daily.     ethosuximide (ZARONTIN) 250 MG capsule TAKE 2 CAPSULES IN THE MORNING, TAKE 2 CAPSULES AT NIGHT 360 capsule 3   levothyroxine (SYNTHROID, LEVOTHROID) 50 MCG tablet Take 50 mcg by mouth daily before breakfast.     memantine (NAMENDA) 10 MG tablet Take 1 tablet (10 mg total) by mouth 2 (two) times daily. 180 tablet 3   pantoprazole (PROTONIX) 40 MG tablet Take 1 tablet (40 mg total) by mouth 2 (two) times daily. AFTER 2 MONTHS CONT AS ONCE A DAY (Patient taking differently: Take 40 mg by mouth daily.) 60 tablet 1   prasugrel (EFFIENT) 10 MG TABS tablet Take 1 tablet (10 mg total) by mouth daily. 90 tablet 3   tiZANidine (ZANAFLEX) 2 MG tablet Take 2 mg by mouth every 6 (six) hours as needed for muscle spasms.     Azelastine HCl 0.15 % SOLN Place 1 spray into both nostrils daily as needed (seasonal allergies).  (Patient not taking: Reported on 06/20/2021)  12   nitroGLYCERIN (NITROSTAT) 0.4 MG SL tablet Place 1 tablet (0.4 mg total) under the tongue every 5 (five) minutes as needed. For chest pain. (Patient not taking: Reported on 06/20/2021) 25 tablet 11   No current  facility-administered medications for this visit.    Allergies:   Sulfamethoxazole, Aspirin, Crestor [rosuvastatin], Erythromycin, Flonase [fluticasone], Lisinopril, Niacin and related, Statins, Sulfa drugs cross reactors, Tetracyclines & related, Xeloda [capecitabine], Penicillins, and Plavix [clopidogrel bisulfate]    Social History:  The patient  reports that she quit smoking about 40 years ago. Her smoking use included cigarettes. She has a 6.00 pack-year smoking history. She has never used smokeless tobacco. She reports current alcohol use  of about 1.0 standard drink per week. She reports that she does not use drugs.   Family History:  The patient's family history includes Cancer in her mother; Heart attack in her father; Heart attack (age of onset: 41) in her brother; Heart disease in her father.    ROS:  Please see the history of present illness.   Otherwise, review of systems are positive for none.   All other systems are reviewed and negative.    PHYSICAL EXAM: VS:  BP 130/74 (BP Location: Left Arm, Patient Position: Sitting, Cuff Size: Normal)    Pulse 87    Ht 5\' 7"  (1.702 m)    Wt 174 lb 14.4 oz (79.3 kg)    SpO2 99%    BMI 27.39 kg/m  , BMI Body mass index is 27.39 kg/m.   GEN: Well nourished, well developed, in no acute distress  HEENT: normal  Neck: no JVD, carotid bruits, or masses Cardiac: RRR; no murmurs, rubs, or gallops,no edema  Respiratory:  clear to auscultation bilaterally, normal work of breathing GI: soft, nontender, nondistended, + BS MS: no deformity or atrophy. No groin hematoma. Skin: warm and dry, no rash Neuro:  Strength and sensation are intact Psych: euthymic mood, full affect   EKG:  EKG is not ordered today. The ekg ordered today demonstrates N/A   Recent Labs: 07/12/2020: TSH 1.090 03/13/2021: ALT 14 03/17/2021: BUN 16; Creatinine, Ser 0.68; Hemoglobin 9.6; Platelets 221; Potassium 3.9; Sodium 133   Dated 03/24/21: Hgb 12.2. normal BMET.   Dated 05/30/21: cholesterol 241, triglycerides 116, HDL 81, LDL 140. CMET and TSH normal.   Lipid Panel    Component Value Date/Time   CHOL 157 03/09/2021 0218   CHOL 176 01/19/2021 1127   TRIG 86 03/09/2021 0218   HDL 68 03/09/2021 0218   HDL 82 01/19/2021 1127   CHOLHDL 2.3 03/09/2021 0218   VLDL 17 03/09/2021 0218   LDLCALC 72 03/09/2021 0218   LDLCALC 52 01/19/2021 1127       Wt Readings from Last 3 Encounters:  06/20/21 174 lb 14.4 oz (79.3 kg)  06/14/21 175 lb 11.3 oz (79.7 kg)  03/28/21 173 lb 3.2 oz (78.6 kg)      Other studies Reviewed: Additional studies/ records that were reviewed today include:   Cardiac cath 02/21/18:  LEFT HEART CATH AND CORS/GRAFTS ANGIOGRAPHY    Conclusion    Previously placed Ost RCA to Mid RCA stent (unknown type) is widely patent. Prox LAD lesion is 100% stenosed. Origin lesion is 100% stenosed. The left ventricular systolic function is normal. LV end diastolic pressure is normal. The left ventricular ejection fraction is 55-65% by visual estimate.     Echo 07/30/20: IMPRESSIONS     1. Left ventricular ejection fraction, by estimation, is 60 to 65%. The  left ventricle has normal function. The left ventricle has no regional  wall motion abnormalities. Left ventricular diastolic parameters are  consistent with Grade I diastolic  dysfunction (impaired relaxation).   2. Right ventricular systolic function is normal. The right ventricular  size is normal.   3. The mitral valve is normal in structure. Trivial mitral valve  regurgitation. No evidence of mitral stenosis.   4. The aortic valve is tricuspid. Aortic valve regurgitation is not  visualized. Mild aortic valve sclerosis is present, with no evidence of  aortic valve stenosis.   5. The inferior vena cava is normal in size with greater than 50%  respiratory variability, suggesting right atrial pressure  of 3 mmHg.   Comparison(s): Prior images unable to be directly  viewed, comparison made  by report only. No significant change from prior study. 07/26/15 EF 60-65%.  GLS -18.6%.   Cardiac cath 03/08/21:  LEFT HEART CATH AND CORS/GRAFTS ANGIOGRAPHY   Conclusion      Prox LAD lesion is 100% stenosed.   Origin lesion is 100% stenosed.   Ost LAD to Prox LAD lesion is 60% stenosed.   Ost 1st Diag to 1st Diag lesion is 85% stenosed.   Ramus lesion is 99% stenosed.   Ost Ramus to Ramus lesion is 90% stenosed.   Non-stenotic Ost RCA to Mid RCA lesion was previously treated.   A drug-eluting stent was successfully placed using a STENT ONYX FRONTIER 3.0X15.   A drug-eluting stent was successfully placed using a SYNERGY XD 3.0X16.   A drug-eluting stent was successfully placed using a SYNERGY XD 3.0X20.   Post intervention, there is a 0% residual stenosis.   Post intervention, there is a 0% residual stenosis.   The left ventricular systolic function is normal.   LV end diastolic pressure is normal.   The left ventricular ejection fraction is 55-65% by visual estimate.   The radiation dose exceeded thresholds defined in the "Patient Radiation Dose Management For Interventional Medical Procedures With Extensive Use of Fluoroscopy" policy. Specific follow up instructions will be provided to the patient prior to discharge.   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 This is the culprit Normal LV function Normal LVEDP Successful PCI of the Ramus intermediate with DES x 3 in overlapping fashion.   Plan: DAPT for one year. Anticipate possible DC tomorrow.   Diagnostic Dominance: Right Intervention   ASSESSMENT AND PLAN:  1.  Syncope. Secondary to orthostatic hypotension/and/or neurocardiogenic syncope. Pacer evaluation has been normal. Recommend liberalization of sodium in diet. Maintain good hydration.  Need to allow more permissive BP. Symptoms improved with cessation of amlodipine but still present.  Will discontinue Coreg.  2. CAD. S/p CABG. S/p extensive stenting of RCA.  in 2017. Recent small NSTEMI with complex stenting of a large ramus intermediate branch with DES x 3. Patent LIMA to LAD. Patent stents in RCA. Recommend indefinite DAPT. On ASA and Effient. 3. Hyperlipidemia. LDL goal < 70. Prior  lab showed good response to Nexlizet with LDL down to 52 in July and 72 in September. Now up to 140. Need to make sure patient is taking medication as prescribed. Will repeat lipid panel in 3 months. If not at goal will need to consider injectable therapy. 4. SSS. S/p pacemaker. Followed in device clinic. Check on 04/14/21 was normal.  5. Acute upper GI bleed requiring transfusion. S/p clipping of visible vessel in gastric antrum. On PPI. Hgb improved to 12.2. no recurrence. Will need to continue DAPT.  6. History of seizures. Monitored by Dr Jannifer Franklin.  7. Chronic mild hyponatremia.  8. Memory loss.    Current medicines are reviewed at length with the patient today.  The patient does not have concerns regarding medicines.  The following changes have been made:  See above.  Labs/ tests ordered today include: none No orders of the defined types were placed in this encounter.     Disposition:   FU with me or APP in  4 months  Signed, Amiere Cawley [name], MD  06/20/2021 11:26 AM    [facility] [address] [place], [place], [address] Phone [phone], Fax [phone]

## 2021-06-17 ENCOUNTER — Ambulatory Visit (HOSPITAL_COMMUNITY): Payer: Medicare Other

## 2021-06-20 ENCOUNTER — Ambulatory Visit (INDEPENDENT_AMBULATORY_CARE_PROVIDER_SITE_OTHER): Payer: Medicare Other | Admitting: Cardiology

## 2021-06-20 ENCOUNTER — Telehealth (HOSPITAL_COMMUNITY): Payer: Self-pay | Admitting: *Deleted

## 2021-06-20 ENCOUNTER — Encounter (HOSPITAL_COMMUNITY): Payer: Medicare Other

## 2021-06-20 ENCOUNTER — Encounter: Payer: Self-pay | Admitting: Cardiology

## 2021-06-20 ENCOUNTER — Other Ambulatory Visit: Payer: Self-pay

## 2021-06-20 VITALS — BP 130/74 | HR 87 | Ht 67.0 in | Wt 174.9 lb

## 2021-06-20 DIAGNOSIS — I495 Sick sinus syndrome: Secondary | ICD-10-CM

## 2021-06-20 DIAGNOSIS — I2510 Atherosclerotic heart disease of native coronary artery without angina pectoris: Secondary | ICD-10-CM | POA: Diagnosis not present

## 2021-06-20 DIAGNOSIS — I951 Orthostatic hypotension: Secondary | ICD-10-CM

## 2021-06-20 DIAGNOSIS — E782 Mixed hyperlipidemia: Secondary | ICD-10-CM

## 2021-06-20 DIAGNOSIS — I10 Essential (primary) hypertension: Secondary | ICD-10-CM

## 2021-06-20 NOTE — Telephone Encounter (Signed)
-----   Message from Peter M [name], MD sent at 06/20/2021 12:17 PM EST ----- Regarding: RE: Cardiac Rehab Yes she can start. Thanks  [name] ----- Message ----- From: Magda Kiel, RN Sent: 06/20/2021  12:07 PM EST To: Peter M [name], MD Subject: Cardiac Rehab                                  Good afternoon Dr [name],  Are you okay with Mrs Carusone to exercise at cardiac rehab? I saw that you discontinued her coreg.   Thanks for your input! Have a good day!  Sincerely,  [facility]  Cardiac rehab

## 2021-06-20 NOTE — Telephone Encounter (Signed)
Spoke with Mr Noller. Yuridiana will attend orientation on 06/20/21 and will begin exercise next Wednesday as they are having lunch with friends on Wednesday.Barnet Pall, RN,BSN 06/20/2021 1:39 PM

## 2021-06-20 NOTE — Patient Instructions (Addendum)
Stop taking carvedilol  We will repeat lipids in 3 months.    Appointment with Dr.Jordan  Raynelle Dick 10/20/21 at 11:00 am

## 2021-06-21 ENCOUNTER — Encounter (HOSPITAL_COMMUNITY): Payer: Self-pay

## 2021-06-21 ENCOUNTER — Encounter (HOSPITAL_COMMUNITY)
Admission: RE | Admit: 2021-06-21 | Discharge: 2021-06-21 | Disposition: A | Discharge status: Home / Self Care | Payer: Medicare Other | Source: Ambulatory Visit | Attending: Cardiology | Admitting: Cardiology

## 2021-06-21 VITALS — BP 144/80 | HR 103 | Ht 67.5 in | Wt 174.2 lb

## 2021-06-21 DIAGNOSIS — I214 Non-ST elevation (NSTEMI) myocardial infarction: Secondary | ICD-10-CM

## 2021-06-21 DIAGNOSIS — Z955 Presence of coronary angioplasty implant and graft: Secondary | ICD-10-CM

## 2021-06-21 NOTE — Progress Notes (Signed)
Cardiac Individual Treatment Plan  Patient Details  Name: Rachel Clark MRN: [id number] Date of Birth: January 28, 1951 Referring Provider:   Flowsheet Row CARDIAC REHAB PHASE II ORIENTATION from 06/21/2021 in [facility]  Referring Provider Peter [name], MD       Initial Encounter Date:  [place] PHASE II ORIENTATION from 06/21/2021 in [facility]  Date 06/21/21       Visit Diagnosis: 03/08/21 NSTEMI   03/28/21 S/P DES x 3 RAMUS  Patient's Home Medications on Admission:  Current Outpatient Medications:    acetaminophen (TYLENOL) 500 MG tablet, Take 500-1,000 mg by mouth daily as needed for mild pain or headache., Disp: , Rfl:    aspirin 81 MG chewable tablet, Chew 1 tablet (81 mg total) by mouth daily., Disp: , Rfl:    Azelastine HCl 0.15 % SOLN, Place 1 spray into both nostrils daily as needed (seasonal allergies).  (Patient not taking: Reported on 06/20/2021), Disp: , Rfl: 12   Bempedoic Acid-Ezetimibe (NEXLIZET) 180-10 MG TABS, Take 180 mg by mouth daily., Disp: 90 tablet, Rfl: 3   calcium carbonate (TUMS - DOSED IN MG ELEMENTAL CALCIUM) 500 MG chewable tablet, Chew 500 mg by mouth daily., Disp: , Rfl:    carbamazepine (TEGRETOL XR) 200 MG 12 hr tablet, Take 3 tablets (600 mg total) by mouth 2 (two) times daily., Disp: 180 tablet, Rfl: 11   Cholecalciferol (VITAMIN D) 50 MCG (2000 UT) CAPS, Take 2,000 Units by mouth daily., Disp: , Rfl:    DULoxetine (CYMBALTA) 60 MG capsule, Take 60 mg by mouth 2 (two) times daily., Disp: , Rfl:    ethosuximide (ZARONTIN) 250 MG capsule, TAKE 2 CAPSULES IN THE MORNING, TAKE 2 CAPSULES AT NIGHT, Disp: 360 capsule, Rfl: 3   levothyroxine (SYNTHROID, LEVOTHROID) 50 MCG tablet, Take 50 mcg by mouth daily before breakfast., Disp: , Rfl:    memantine (NAMENDA) 10 MG tablet, Take 1 tablet (10 mg total) by mouth 2 (two) times daily., Disp: 180 tablet, Rfl: 3   nitroGLYCERIN  (NITROSTAT) 0.4 MG SL tablet, Place 1 tablet (0.4 mg total) under the tongue every 5 (five) minutes as needed. For chest pain. (Patient not taking: Reported on 06/20/2021), Disp: 25 tablet, Rfl: 11   pantoprazole (PROTONIX) 40 MG tablet, Take 1 tablet (40 mg total) by mouth 2 (two) times daily. AFTER 2 MONTHS CONT AS ONCE A DAY (Patient taking differently: Take 40 mg by mouth daily.), Disp: 60 tablet, Rfl: 1   prasugrel (EFFIENT) 10 MG TABS tablet, Take 1 tablet (10 mg total) by mouth daily., Disp: 90 tablet, Rfl: 3   tiZANidine (ZANAFLEX) 2 MG tablet, Take 2 mg by mouth every 6 (six) hours as needed for muscle spasms., Disp: , Rfl:   Past Medical History: Past Medical History:  Diagnosis Date   Arthritis    "fingers" (06/29/2016)   Chronic lower back pain    Colon cancer ([facility]) 12/04/2011   s/p Laparoscopic-assisted transverse colectomy on 12/19/2011 by Dr. Donne Hazel.  pT3 N0 M0.    Coronary artery disease    a. remote NSTEMI with PCI to the RCA; b. S/P CABG in 2007; c. 2010 - 2 v CAD with L-LAD and S-RCA patent, small caliber Dx 70% (treated medically - no amenable to PCI), CFX free of significant disease, normal LVF. d. 10/2014 Lexi MV: EF 61%, no ischemia/infarct.   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   last seizure was in 1972 ;takes Tegretol (06/29/2016)    Headache    "weekly" (06/29/2016)   Hyperlipidemia    Hypothyroid    Iron deficiency anemia 11/17/2011   Memory change 11/13/2016   Monoclonal gammopathy of unknown significance    Orthostatic hypotension 12/21/2020   Presence of permanent cardiac pacemaker    Syncope and collapse    pacemaker implanted    Tobacco Use: Social History   Tobacco Use  Smoking Status Former   Packs/day: 0.30   Years: 20.00   Pack years: 6.00   Types: Cigarettes   Quit date: 06/06/1981   Years since quitting: 40.0   Smokeless Tobacco Never    Labs: Recent Review Flowsheet Data     Labs for ITP Cardiac and Pulmonary Rehab Latest Ref Rng & Units 12/17/2020 01/19/2021 03/08/2021 03/09/2021 03/13/2021   Cholestrol 0 - 200 mg/dL 261(H) 176 - 157 -   LDLCALC 0 - 99 mg/dL 151(H) 52 - 72 -   HDL >40 mg/dL 86 82 - 68 -   Trlycerides <150 mg/dL 141 276(H) - 86 -   Hemoglobin A1c 4.8 - 5.6 % - - - - -   PHART 7.350 - 7.450 - - 7.213(L) - -   PCO2ART 32.0 - 48.0 mmHg - - 46.1 - -   HCO3 20.0 - 28.0 mmol/L - - 18.5(L) - -   TCO2 22 - 32 mmol/L - - 20(L) - 24   ACIDBASEDEF 0.0 - 2.0 mmol/L - - 9.0(H) - -   O2SAT % - - 97.0 - -       Capillary Blood Glucose: Lab Results  Component Value Date   GLUCAP 140 (H) 07/27/2015     Exercise Target Goals: Exercise Program Goal: Individual exercise prescription set using results from initial 6 min walk test and THRR while considering  patients activity barriers and safety.   Exercise Prescription Goal: Starting with aerobic activity 30 plus minutes a day, 3 days per week for initial exercise prescription. Provide home exercise prescription and guidelines that participant acknowledges understanding prior to discharge.  Activity Barriers & Risk Stratification:  Activity Barriers & Cardiac Risk Stratification - 06/14/21 1111       Activity Barriers & Cardiac Risk Stratification   Activity Barriers Fibromyalgia;Deconditioning;Muscular Weakness;Balance Concerns;History of Falls;Other (comment)    Comments Chronic Fatiigue, lighthededness/dizziness/orthostasis    Cardiac Risk Stratification High             6 Minute Walk:  6 Minute Walk     Row Name 06/21/21 1150         6 Minute Walk   Phase Initial     Distance 1064 feet     Walk Time 6 minutes     # of Rest Breaks 0     MPH 2.02     METS 2.72     RPE 9     Perceived Dyspnea  0     VO2 Peak 9.52     Symptoms No     Resting HR 96 bpm     Resting BP 144/80     Resting Oxygen Saturation  99 %      Exercise Oxygen Saturation  during 6 min walk 99 %     Max Ex. HR 108 bpm     Max Ex. BP 158/82     2 Minute Post BP 138/80  Oxygen Initial Assessment:   Oxygen Re-Evaluation:   Oxygen Discharge (Final Oxygen Re-Evaluation):   Initial Exercise Prescription:  Initial Exercise Prescription - 06/21/21 1300       Date of Initial Exercise RX and Referring Provider   Date 06/21/21    Referring Provider Peter [name], MD    Expected Discharge Date 08/12/21      NuStep   Level 1    SPM 75    Minutes 25    METs 2      Prescription Details   Frequency (times per week) 3    Duration Progress to 30 minutes of continuous aerobic without signs/symptoms of physical distress      Intensity   THRR 40-80% of Max Heartrate 60-119    Ratings of Perceived Exertion 11-13    Perceived Dyspnea 0-4      Progression   Progression Continue progressive overload as per policy without signs/symptoms or physical distress.      Resistance Training   Training Prescription Yes    Weight 2 lb    Reps 10-15             Perform Capillary Blood Glucose checks as needed.  Exercise Prescription Changes:   Exercise Comments:   Exercise Comments     Row Name 06/14/21 1116 06/21/21 1402         Exercise Comments Unable to proceed with 6-minute walk test due to symptomatic orthostatic BP/lightheadedness Pt returned today to complete 6-minute walk test. Pt was able to complete the walk test today with the aid of a rollator and no rest breaks. Tolerated well.               Exercise Goals and Review:   Exercise Goals     Row Name 06/14/21 1113             Exercise Goals   Increase Physical Activity Yes       Intervention Provide advice, education, support and counseling about physical activity/exercise needs.;Develop an individualized exercise prescription for aerobic and resistive training based on initial evaluation findings, risk stratification,  comorbidities and participant's personal goals.       Expected Outcomes Short Term: Attend rehab on a regular basis to increase amount of physical activity.;Long Term: Add in home exercise to make exercise part of routine and to increase amount of physical activity.;Long Term: Exercising regularly at least 3-5 days a week.       Increase Strength and Stamina Yes       Intervention Provide advice, education, support and counseling about physical activity/exercise needs.;Develop an individualized exercise prescription for aerobic and resistive training based on initial evaluation findings, risk stratification, comorbidities and participant's personal goals.       Expected Outcomes Short Term: Increase workloads from initial exercise prescription for resistance, speed, and METs.;Short Term: Perform resistance training exercises routinely during rehab and add in resistance training at home;Long Term: Improve cardiorespiratory fitness, muscular endurance and strength as measured by increased METs and functional capacity (6MWT)       Able to understand and use rate of perceived exertion (RPE) scale Yes       Intervention Provide education and explanation on how to use RPE scale       Expected Outcomes Short Term: Able to use RPE daily in rehab to express subjective intensity level;Long Term:  Able to use RPE to guide intensity level when exercising independently       Knowledge and understanding of Target Heart Rate  Range (THRR) Yes       Intervention Provide education and explanation of THRR including how the numbers were predicted and where they are located for reference       Expected Outcomes Short Term: Able to state/look up THRR;Long Term: Able to use THRR to govern intensity when exercising independently;Short Term: Able to use daily as guideline for intensity in rehab       Understanding of Exercise Prescription Yes       Intervention Provide education, explanation, and written materials on patient's  individual exercise prescription       Expected Outcomes Short Term: Able to explain program exercise prescription;Long Term: Able to explain home exercise prescription to exercise independently                Exercise Goals Re-Evaluation :    Discharge Exercise Prescription (Final Exercise Prescription Changes):   Nutrition:  Target Goals: Understanding of nutrition guidelines, daily intake of sodium 1500mg , cholesterol 200mg , calories 30% from fat and 7% or less from saturated fats, daily to have 5 or more servings of fruits and vegetables.  Biometrics:  Pre Biometrics - 06/14/21 1113       Pre Biometrics   Waist Circumference 41.5 inches    Hip Circumference 45 inches    Waist to Hip Ratio 0.92 %    Triceps Skinfold 20 mm    % Body Fat 39.1 %    Grip Strength 17 kg    Flexibility 14.5 in    Single Leg Stand 2.12 seconds              Nutrition Therapy Plan and Nutrition Goals:   Nutrition Assessments:  MEDIFICTS Score Key: ?70 Need to make dietary changes  40-70 Heart Healthy Diet ? 40 Therapeutic Level Cholesterol Diet   Picture Your Plate Scores: <16 Unhealthy dietary pattern with much room for improvement. 41-50 Dietary pattern unlikely to meet recommendations for good health and room for improvement. 51-60 More healthful dietary pattern, with some room for improvement.  >60 Healthy dietary pattern, although there may be some specific behaviors that could be improved.    Nutrition Goals Re-Evaluation:   Nutrition Goals Discharge (Final Nutrition Goals Re-Evaluation):   Psychosocial: Target Goals: Acknowledge presence or absence of significant depression and/or stress, maximize coping skills, provide positive support system. Participant is able to verbalize types and ability to use techniques and skills needed for reducing stress and depression.  Initial Review & Psychosocial Screening:  Initial Psych Review & Screening - 06/14/21 1124        Initial Review   Current issues with Current Stress Concerns    Source of Stress Concerns Chronic Illness;Unable to perform yard/household activities;Unable to participate in former interests or hobbies    Comments Cynthis has ongoing issues with orthostatic hypotension and has not been able to do alot around the house      [place]? Yes   Lamyra has her husband and her two children for support     Barriers   Psychosocial barriers to participate in program The patient should benefit from training in stress management and relaxation.      Screening Interventions   Interventions Encouraged to exercise    Expected Outcomes Long Term Goal: Stressors or current issues are controlled or eliminated.;Long Term goal: The participant improves quality of Life and PHQ9 Scores as seen by post scores and/or verbalization of changes  Quality of Life Scores:  Quality of Life - 06/14/21 1104       Quality of Life   Select Quality of Life      Quality of Life Scores   Health/Function Pre 28.67 %    Socioeconomic Pre 29.58 %    Psych/Spiritual Pre 30 %    Family Pre 30 %    GLOBAL Pre 29.32 %            Scores of 19 and below usually indicate a poorer quality of life in these areas.  A difference of  2-3 points is a clinically meaningful difference.  A difference of 2-3 points in the total score of the Quality of Life Index has been associated with significant improvement in overall quality of life, self-image, physical symptoms, and general health in studies assessing change in quality of life.  PHQ-9: Recent Review Flowsheet Data     Depression screen [facility] 2/9 06/14/2021   Decreased Interest 1   Down, Depressed, Hopeless 0   PHQ - 2 Score 1   Altered sleeping 0   Tired, decreased energy 3   Change in appetite 0   Feeling bad or failure about yourself  0   Trouble concentrating 1   Moving slowly or fidgety/restless 0   Suicidal thoughts  0   PHQ-9 Score 5   Difficult doing work/chores Not difficult at all      Interpretation of Total Score  Total Score Depression Severity:  1-4 = Minimal depression, 5-9 = Mild depression, 10-14 = Moderate depression, 15-19 = Moderately severe depression, 20-27 = Severe depression   Psychosocial Evaluation and Intervention:   Psychosocial Re-Evaluation:   Psychosocial Discharge (Final Psychosocial Re-Evaluation):   Vocational Rehabilitation: Provide vocational rehab assistance to qualifying candidates.   Vocational Rehab Evaluation & Intervention:  Vocational Rehab - 06/14/21 1126       Initial Vocational Rehab Evaluation & Intervention   Assessment shows need for Vocational Rehabilitation No   Cynthis is retired and does not need vocational rehab at this time            Education: Education Goals: Education classes will be provided on a weekly basis, covering required topics. Participant will state understanding/return demonstration of topics presented.  Learning Barriers/Preferences:  Learning Barriers/Preferences - 06/14/21 1157       Learning Barriers/Preferences   Learning Barriers Sight   Memory deficits, dizziness   Learning Preferences Audio;Written Material;Computer/Internet;Group Instruction;Individual Instruction;Pictoral;Skilled Demonstration;Verbal Instruction;Video             Education Topics: Hypertension, Hypertension Reduction -Define heart disease and high blood pressure. Discus how high blood pressure affects the body and ways to reduce high blood pressure.   Exercise and Your Heart -Discuss why it is important to exercise, the FITT principles of exercise, normal and abnormal responses to exercise, and how to exercise safely.   Angina -Discuss definition of angina, causes of angina, treatment of angina, and how to decrease risk of having angina.   Cardiac Medications -Review what the following cardiac medications are used for, how  they affect the body, and side effects that may occur when taking the medications.  Medications include Aspirin, Beta blockers, calcium channel blockers, ACE Inhibitors, angiotensin receptor blockers, diuretics, digoxin, and antihyperlipidemics.   Congestive Heart Failure -Discuss the definition of CHF, how to live with CHF, the signs and symptoms of CHF, and how keep track of weight and sodium intake.   Heart Disease and Intimacy -Discus the effect sexual activity  has on the heart, how changes occur during intimacy as we age, and safety during sexual activity.   Smoking Cessation / COPD -Discuss different methods to quit smoking, the health benefits of quitting smoking, and the definition of COPD.   Nutrition I: Fats -Discuss the types of cholesterol, what cholesterol does to the heart, and how cholesterol levels can be controlled.   Nutrition II: Labels -Discuss the different components of food labels and how to read food label   Heart Parts/Heart Disease and PAD -Discuss the anatomy of the heart, the pathway of blood circulation through the heart, and these are affected by heart disease.   Stress I: Signs and Symptoms -Discuss the causes of stress, how stress may lead to anxiety and depression, and ways to limit stress.   Stress II: Relaxation -Discuss different types of relaxation techniques to limit stress.   Warning Signs of Stroke / TIA -Discuss definition of a stroke, what the signs and symptoms are of a stroke, and how to identify when someone is having stroke.   Knowledge Questionnaire Score:  Knowledge Questionnaire Score - 06/14/21 1104       Knowledge Questionnaire Score   Pre Score 21/24             Core Components/Risk Factors/Patient Goals at Admission:  Personal Goals and Risk Factors at Admission - 06/14/21 1105       Core Components/Risk Factors/Patient Goals on Admission    Weight Management Weight Maintenance    Lipids Yes     Intervention Provide education and support for participant on nutrition & aerobic/resistive exercise along with prescribed medications to achieve LDL 70mg , HDL >40mg .    Expected Outcomes Short Term: Participant states understanding of desired cholesterol values and is compliant with medications prescribed. Participant is following exercise prescription and nutrition guidelines.;Long Term: Cholesterol controlled with medications as prescribed, with individualized exercise RX and with personalized nutrition plan. Value goals: LDL < 70mg , HDL > 40 mg.             Core Components/Risk Factors/Patient Goals Review:    Core Components/Risk Factors/Patient Goals at Discharge (Final Review):    ITP Comments:  ITP Comments     Row Name 06/14/21 1123           ITP Comments Dr Fransico Him MD, Medical Director                Comments: Patient attended orientation on 06/21/2021 to review rules and guidelines for program.  Completed 6 minute walk test, Intitial ITP, and exercise prescription.  VSS. Telemetry-Sinus Rhythm.some orthostatic changes were noted this is an ongoing issue. Caidynce reports feeing better since her beta blocker has been discontinued. Tuana did report having some dizziness this went away after a few minutes.Cristine used a rollator for stability.. Safety measures and social distancing in place per CDC guidelines. Harrell Gave RN BSN

## 2021-06-22 ENCOUNTER — Encounter (HOSPITAL_COMMUNITY): Payer: Medicare Other

## 2021-06-22 ENCOUNTER — Ambulatory Visit (HOSPITAL_COMMUNITY): Payer: Medicare Other

## 2021-06-22 NOTE — Progress Notes (Deleted)
PATIENT: Rachel Clark DOB: 12-07-1950  REASON FOR VISIT: Follow up HISTORY FROM: Patient PRIMARY NEUROLOGIST:   HISTORY OF PRESENT ILLNESS: Today 06/22/21 Rachel Clark here today for follow-up with history of seizures, orthostatic hypotension, peripheral neuropathy, positive monoclonal antibody, and history of syncope.  Has a pacemaker.  Is on carbamazepine and Zarontin for seizures. Carotid doppler showed no significant abnormalities.  Is on Namenda for memory. HISTORY  12/21/2020 Dr. Jannifer Clark: Rachel Clark is a 70 year old right-handed white female with a history of seizures since she was a child, she has not had a seizure in over 50 years, she is on carbamazepine and Zarontin, she has never wished to try to get off of her medication.  Her son also has a history of seizures.  Since August 2019 she has had low sodium levels.  She has been on a combination of carbamazepine and Cymbalta, but she has been on this for quite a number of years without any sodium level problems.  The patient has more recently had significant issues with orthostatic hypotension.  She does have a monoclonal antibody and is followed through hematology.  She has not had a demonstrated peripheral neuropathy on nerve conduction studies in the past.  The patient however has had significant issues with being dizzy with standing, she has had multiple syncopal episodes, the most recent was 2 days ago.  The patient has been to the emergency room on 04 September 2020 after she hit her head with a blackout.  She went to the emergency room on 10 Nov 2020 with another significant blackout.  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  The patient is followed by Dr. Peter [name].  Blood pressure medications have been reduced in dosing.  The patient has gone from 10 mg a day to 2.5 mg and Norvasc, and the Coreg dose was cut in half.  The patient has been told to increase fluids and  salt.  She does have compression stockings.  The patient has had some decline in memory over time.  She is no longer driving mainly due to the dizziness issue.  The patient has had increasing problems with remembering recipes with cooking, she is still managing her medications.  Her husband helps her with the appointments.  The patient is repeating herself frequently throughout the day.  The patient claims that she feels bored, isolated.  She sleeps quite a bit throughout the day.  She comes to this office for further evaluation.  The patient does have a prior history of depression which is still an issue for her.  REVIEW OF SYSTEMS: Out of a complete 14 system review of symptoms, the patient complains only of the following symptoms, and all other reviewed systems are negative.  ALLERGIES: Allergies  Allergen Reactions   Sulfamethoxazole Anaphylaxis    Don't recall   Aspirin Other (See Comments)    GI upset- can tolerate 81 mg ASA, just not full doses   Crestor [Rosuvastatin]     myalgias   Erythromycin Nausea Only    Gi upset   Flonase [Fluticasone] Other (See Comments)    Sore throat   Lisinopril Cough   Niacin And Related Other (See Comments)    Don't recall   Statins Other (See Comments)    Muscle pain   Sulfa Drugs Cross Reactors Swelling    Don't recall   Tetracyclines & Related Nausea Only   Xeloda [Capecitabine] Diarrhea  Penicillins Nausea Only and Rash        Plavix [Clopidogrel Bisulfate] Rash    HOME MEDICATIONS: Outpatient Medications Prior to Visit  Medication Sig Dispense Refill   acetaminophen (TYLENOL) 500 MG tablet Take 500-1,000 mg by mouth daily as needed for mild pain or headache.     aspirin 81 MG chewable tablet Chew 1 tablet (81 mg total) by mouth daily.     Azelastine HCl 0.15 % SOLN Place 1 spray into both nostrils daily as needed (seasonal allergies).  (Patient not taking: Reported on 06/20/2021)  12   Bempedoic Acid-Ezetimibe (NEXLIZET) 180-10 MG  TABS Take 180 mg by mouth daily. 90 tablet 3   calcium carbonate (TUMS - DOSED IN MG ELEMENTAL CALCIUM) 500 MG chewable tablet Chew 500 mg by mouth daily.     carbamazepine (TEGRETOL XR) 200 MG 12 hr tablet Take 3 tablets (600 mg total) by mouth 2 (two) times daily. 180 tablet 11   Cholecalciferol (VITAMIN D) 50 MCG (2000 UT) CAPS Take 2,000 Units by mouth daily.     DULoxetine (CYMBALTA) 60 MG capsule Take 60 mg by mouth 2 (two) times daily.     ethosuximide (ZARONTIN) 250 MG capsule TAKE 2 CAPSULES IN THE MORNING, TAKE 2 CAPSULES AT NIGHT 360 capsule 3   levothyroxine (SYNTHROID, LEVOTHROID) 50 MCG tablet Take 50 mcg by mouth daily before breakfast.     memantine (NAMENDA) 10 MG tablet Take 1 tablet (10 mg total) by mouth 2 (two) times daily. 180 tablet 3   nitroGLYCERIN (NITROSTAT) 0.4 MG SL tablet Place 1 tablet (0.4 mg total) under the tongue every 5 (five) minutes as needed. For chest pain. (Patient not taking: Reported on 06/20/2021) 25 tablet 11   pantoprazole (PROTONIX) 40 MG tablet Take 1 tablet (40 mg total) by mouth 2 (two) times daily. AFTER 2 MONTHS CONT AS ONCE A DAY (Patient taking differently: Take 40 mg by mouth daily.) 60 tablet 1   prasugrel (EFFIENT) 10 MG TABS tablet Take 1 tablet (10 mg total) by mouth daily. 90 tablet 3   tiZANidine (ZANAFLEX) 2 MG tablet Take 2 mg by mouth every 6 (six) hours as needed for muscle spasms.     No facility-administered medications prior to visit.    PAST MEDICAL HISTORY: Past Medical History:  Diagnosis Date   Arthritis    "fingers" (06/29/2016)   Chronic lower back pain    Colon cancer ([place]) 12/04/2011   s/p Laparoscopic-assisted transverse colectomy on 12/19/2011 by Dr. Donne Hazel.  pT3 N0 M0.    Coronary artery disease    a. remote NSTEMI with PCI to the RCA; b. S/P CABG in 2007; c. 2010 - 2 v CAD with L-LAD and S-RCA patent, small caliber Dx 70% (treated medically - no amenable to PCI), CFX free of significant disease, normal LVF. d.  10/2014 Lexi MV: EF 61%, no ischemia/infarct.   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, controlled ([place]) 12/06/2011   last seizure was in 1972 ;takes Tegretol (06/29/2016)    Headache    "weekly" (06/29/2016)   Hyperlipidemia    Hypothyroid    Iron deficiency anemia 11/17/2011   Memory change 11/13/2016   Monoclonal gammopathy of unknown significance    Orthostatic hypotension 12/21/2020   Presence of permanent cardiac pacemaker    Syncope and collapse    pacemaker implanted    PAST SURGICAL  HISTORY: Past Surgical History:  Procedure Laterality Date   ANTERIOR CERVICAL DECOMP/DISCECTOMY FUSION  1980's   BACK SURGERY     CARDIAC CATHETERIZATION  05/20/2009   obstructive native vessel disease in LAD, RCA, and first diagonal, patent vein graft to distal RCA and LIMA to LAD,normal. ef 60%   CARDIAC CATHETERIZATION N/A 03/24/2015   Procedure: Left Heart Cath and Cors/Grafts Angiography;  Surgeon: Wellington Hampshire, MD;  Location: [facility];  Service: Cardiovascular;  Laterality: N/A;   CARDIAC CATHETERIZATION N/A 07/28/2015   Procedure: Left Heart Cath and Coronary Angiography;  Surgeon: Troy Sine, MD;  Location: [facility];  Service: Cardiovascular;  Laterality: N/A;   CARDIAC CATHETERIZATION N/A 11/09/2015   Procedure: Coronary Stent Intervention;  Surgeon: Sherren Mocha, MD;  Location: [facility];  Service: Cardiovascular;  Laterality: N/A;   CARDIAC CATHETERIZATION N/A 11/09/2015   Procedure: Left Heart Cath and Coronary Angiography;  Surgeon: Sherren Mocha, MD;  Location: [facility];  Service: Cardiovascular;  Laterality: N/A;   CARDIAC CATHETERIZATION N/A 06/29/2016   Procedure: Left Heart Cath and Coronary Angiography;  Surgeon: Nelva Bush, MD;  Location: [facility];  Service: Cardiovascular;  Laterality: N/A;   CARDIAC CATHETERIZATION N/A  06/29/2016   Procedure: Intravascular Pressure Wire/FFR Study;  Surgeon: Nelva Bush, MD;  Location: [facility];  Service: Cardiovascular;  Laterality: N/A;   CARDIAC CATHETERIZATION N/A 06/29/2016   Procedure: Coronary Balloon Angioplasty;  Surgeon: Nelva Bush, MD;  Location: [facility];  Service: Cardiovascular;  Laterality: N/A;   COLON RESECTION  12/19/2011   Procedure: COLON RESECTION LAPAROSCOPIC;  Surgeon: Rolm Bookbinder, MD;  Location: [facility];  Service: General;  Laterality: N/A;  laparoscopic hand assisted partial colon resection   COLON SURGERY     COLONOSCOPY     CORONARY ANGIOPLASTY WITH STENT PLACEMENT  ~ 2007   1 stent   CORONARY ARTERY BYPASS GRAFT  2007   "CABG X2"   DILATION AND CURETTAGE OF UTERUS  1973   EP IMPLANTABLE DEVICE N/A 03/25/2015   MDT Advisa DR pacemaker implanted by Dr Rayann Heman for transient complete heart block and syncope   ESOPHAGOGASTRODUODENOSCOPY (EGD) WITH PROPOFOL N/A 03/14/2021   Procedure: ESOPHAGOGASTRODUODENOSCOPY (EGD) WITH PROPOFOL;  Surgeon: Ronnette Juniper, MD;  Location: [facility];  Service: Gastroenterology;  Laterality: N/A;   HEMOSTASIS CLIP PLACEMENT  03/14/2021   Procedure: HEMOSTASIS CLIP PLACEMENT;  Surgeon: Ronnette Juniper, MD;  Location: [facility];  Service: Gastroenterology;;   INSERT / REPLACE / [place] CATH AND CORS/GRAFTS ANGIOGRAPHY N/A 02/21/2018   Procedure: LEFT HEART CATH AND CORS/GRAFTS ANGIOGRAPHY;  Surgeon: Lorretta Harp, MD;  Location: [facility];  Service: Cardiovascular;  Laterality: N/A;   LEFT HEART CATH AND CORS/GRAFTS ANGIOGRAPHY N/A 03/08/2021   Procedure: LEFT HEART CATH AND CORS/GRAFTS ANGIOGRAPHY;  Surgeon: [name], Rachel M, MD;  Location: [facility];  Service: Cardiovascular;  Laterality: N/A;   PORT-A-CATH REMOVAL N/A 06/12/2013   Procedure: REMOVAL PORT-A-CATH;  Surgeon: Rolm Bookbinder, MD;  Location: [facility];  Service: General;   Laterality: N/A;   PORTACATH PLACEMENT  06/04/2012   Procedure: INSERTION PORT-A-CATH;  Surgeon: Rolm Bookbinder, MD;  Location: [facility];  Service: General;  Laterality: N/A;  Insertion of port-a-cath    POSTERIOR LAMINECTOMY / [place]  03/14/2021   Procedure: SCLEROTHERAPY;  Surgeon: Ronnette Juniper, MD;  Location:  [facility];  Service: Gastroenterology;;   VAGINAL HYSTERECTOMY      FAMILY HISTORY: Family History  Problem Relation Age of Onset   Heart attack Father    Heart disease Father    Heart attack Brother 76   Cancer Mother        lung   Stroke Neg Hx     SOCIAL HISTORY: Social History   Socioeconomic History   Marital status: Married    Spouse name: Not on file   Number of children: 2   Years of education: 12   Highest education level: Not on file  Occupational History   Occupation: Retired  Tobacco Use   Smoking status: Former    Packs/day: 0.30    Years: 20.00    Pack years: 6.00    Types: Cigarettes    Quit date: 06/06/1981    Years since quitting: 40.0   Smokeless tobacco: Never  Vaping Use   Vaping Use: Never used  Substance and Sexual Activity   Alcohol use: Yes    Alcohol/week: 1.0 standard drink    Types: 1 Glasses of wine per week    Comment: occ   Drug use: No   Sexual activity: Not Currently    Birth control/protection: Surgical  Other Topics Concern   Not on file  Social History Narrative   Patient is married with 2 children.   Patient is right handed.   Patient has a high school education with some college education.   Patient drinks 2 cups daily.   Social Determinants of Health   Financial Resource Strain: Not on file  Food Insecurity: Not on file  Transportation Needs: Not on file  Physical Activity: Not on file  Stress: Not on file  Social Connections: Not on file  Intimate Partner Violence: Not on file      PHYSICAL EXAM  There were no vitals filed for this visit. There is no  height or weight on file to calculate BMI.  Generalized: Well developed, in no acute distress   Neurological examination  Mentation: Alert oriented to time, place, history taking. Follows all commands speech and language fluent Cranial nerve II-XII: Pupils were equal round reactive to light. Extraocular movements were full, visual field were full on confrontational test. Facial sensation and strength were normal. Uvula tongue midline. Head turning and shoulder shrug  were normal and symmetric. Motor: The motor testing reveals 5 over 5 strength of all 4 extremities. Good symmetric motor tone is noted throughout.  Sensory: Sensory testing is intact to soft touch on all 4 extremities. No evidence of extinction is noted.  Coordination: Cerebellar testing reveals good finger-nose-finger and heel-to-shin bilaterally.  Gait and station: Gait is normal. Tandem gait is normal. Romberg is negative. No drift is seen.  Reflexes: Deep tendon reflexes are symmetric and normal bilaterally.   DIAGNOSTIC DATA (LABS, IMAGING, TESTING) - I reviewed patient records, labs, notes, testing and imaging myself where available.  Lab Results  Component Value Date   WBC 10.0 03/17/2021   HGB 9.6 (L) 03/17/2021   HCT 27.5 (L) 03/17/2021   MCV 87.9 03/17/2021   PLT 221 03/17/2021      Component Value Date/Time   NA 133 (L) 03/17/2021 0550   NA 132 (L) 01/19/2021 1129   NA 137 07/11/2016 0820   K 3.9 03/17/2021 1304   K 4.4 07/11/2016 0820   CL 99 03/17/2021 0550   CL 106 12/25/2012 0924   CO2 25 03/17/2021 0550   CO2  27 07/11/2016 0820   GLUCOSE 125 (H) 03/17/2021 0550   GLUCOSE 115 07/11/2016 0820   GLUCOSE 105 (H) 12/25/2012 0924   BUN 16 03/17/2021 0550   BUN 14 01/19/2021 1129   BUN 15.6 07/11/2016 0820   CREATININE 0.68 03/17/2021 0550   CREATININE 0.8 07/11/2016 0820   CALCIUM 7.9 (L) 03/17/2021 0550   CALCIUM 8.9 07/11/2016 0820   PROT 6.5 03/13/2021 0927   PROT 6.8 01/19/2021 1132   PROT 7.1  07/11/2016 0820   ALBUMIN 3.3 (L) 03/13/2021 0927   ALBUMIN 4.2 01/19/2021 1132   ALBUMIN 3.6 07/11/2016 0820   AST 17 03/13/2021 0927   AST 13 07/11/2016 0820   ALT 14 03/13/2021 0927   ALT 14 07/11/2016 0820   ALKPHOS 43 03/13/2021 0927   ALKPHOS 89 07/11/2016 0820   BILITOT 0.5 03/13/2021 0927   BILITOT 0.2 01/19/2021 1132   BILITOT 0.29 07/11/2016 0820   GFRNONAA >60 03/17/2021 0550   GFRAA 97 08/17/2020 1111   Lab Results  Component Value Date   CHOL 157 03/09/2021   HDL 68 03/09/2021   LDLCALC 72 03/09/2021   TRIG 86 03/09/2021   CHOLHDL 2.3 03/09/2021   Lab Results  Component Value Date   HGBA1C 5.5 02/20/2018   Lab Results  Component Value Date   VITAMINB12 290 11/13/2016   Lab Results  Component Value Date   TSH 1.090 07/12/2020      ASSESSMENT AND PLAN 70 y.o. 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 here with ***   I spent 15 minutes with the patient. 50% of this time was spent   Butler Denmark, [place], DNP 06/22/2021, 4:06 PM [facility] [address] [place], [place] [address] [phone]

## 2021-06-23 ENCOUNTER — Ambulatory Visit: Payer: Medicare Other | Admitting: Neurology

## 2021-06-23 ENCOUNTER — Encounter: Payer: Self-pay | Admitting: Neurology

## 2021-06-23 ENCOUNTER — Telehealth: Payer: Self-pay | Admitting: Neurology

## 2021-06-23 NOTE — Telephone Encounter (Signed)
FYI- Pt called state she will not make it, the weather is too bad.

## 2021-06-24 ENCOUNTER — Ambulatory Visit (HOSPITAL_COMMUNITY): Payer: Medicare Other

## 2021-06-24 ENCOUNTER — Encounter (HOSPITAL_COMMUNITY): Payer: Medicare Other

## 2021-06-25 ENCOUNTER — Other Ambulatory Visit: Payer: Self-pay | Admitting: Cardiology

## 2021-06-25 ENCOUNTER — Other Ambulatory Visit: Payer: Self-pay | Admitting: Neurology

## 2021-06-28 NOTE — Telephone Encounter (Signed)
Rx refilled.

## 2021-06-29 ENCOUNTER — Ambulatory Visit (HOSPITAL_COMMUNITY): Payer: Medicare Other

## 2021-06-29 ENCOUNTER — Other Ambulatory Visit: Payer: Self-pay

## 2021-06-29 ENCOUNTER — Encounter (HOSPITAL_COMMUNITY)
Admission: RE | Admit: 2021-06-29 | Discharge: 2021-06-29 | Disposition: A | Discharge status: Home / Self Care | Payer: Medicare Other | Source: Ambulatory Visit | Attending: Cardiology | Admitting: Cardiology

## 2021-06-29 DIAGNOSIS — I214 Non-ST elevation (NSTEMI) myocardial infarction: Secondary | ICD-10-CM | POA: Diagnosis not present

## 2021-06-29 DIAGNOSIS — Z955 Presence of coronary angioplasty implant and graft: Secondary | ICD-10-CM

## 2021-06-29 NOTE — Progress Notes (Signed)
Daily Session Note  Patient Details  Name: Rachel Clark MRN: [id number] Date of Birth: April 28, 1951 Referring Provider:   Flowsheet Row CARDIAC REHAB PHASE II ORIENTATION from 06/21/2021 in [facility]  Referring Provider Rachel [name], MD       Encounter Date: 06/29/2021  Check In:  Session Check In - 06/29/21 1309       Check-In   Supervising physician immediately available to respond to emergencies [facility] immediately available    Physician(s) Dr. Broadus John    Location [facility]    Staff Present Rachel Rubenstein, MS, ACSM-CEP, CCRP, Exercise Physiologist;Rachel Clark BS, ACSM EP-C, Exercise Physiologist;Rachel Leonia Reeves, RN, Rachel Pretty, MS, ACSM CEP, Exercise Physiologist;Rachel Shean, RN, BSN    Virtual Visit No    Medication changes reported     No    Fall or balance concerns reported    Yes    Comments Patient wears compression stockings    Tobacco Cessation No Change    Current number of cigarettes/nicotine per day     0    Warm-up and Cool-down Performed as group-led instruction    Resistance Training Performed No    VAD Patient? No    PAD/SET Patient? No      Pain Assessment   Currently in Pain? No/denies    Pain Score 0-No pain    Multiple Pain Sites No             Capillary Blood Glucose: No results found for this or any previous visit (from the past 24 hour(s)).   Exercise Prescription Changes - 06/29/21 1600       Response to Exercise   Blood Pressure (Admit) 138/82    Blood Pressure (Exercise) 138/70    Blood Pressure (Exit) 108/74    Heart Rate (Admit) 95 bpm    Heart Rate (Exercise) 115 bpm    Heart Rate (Exit) 87 bpm    Rating of Perceived Exertion (Exercise) 10    Symptoms Lightheadedness after the nustep upon standing    Comments Pt's first day in the CRP2 program    Duration Progress to 30 minutes of  aerobic without signs/symptoms of physical distress    Intensity THRR  unchanged      Progression   Progression Continue to progress workloads to maintain intensity without signs/symptoms of physical distress.    Average METs 2.2      Resistance Training   Training Prescription No    Weight No weights on Wednesdays      Interval Training   Interval Training No      NuStep   Level 1    SPM 80    Minutes 25    METs 2.2             Social History   Tobacco Use  Smoking Status Former   Packs/day: 0.30   Years: 20.00   Pack years: 6.00   Types: Cigarettes   Quit date: 06/06/1981   Years since quitting: 40.0  Smokeless Tobacco Never    Goals Met:  Exercise tolerated well No report of concerns or symptoms today  Goals Unmet:  Not Applicable  Comments: Rachel Clark started cardiac rehab today.  Pt tolerated light exercise without difficulty. VSS, telemetry-Sinus Rhythm, asymptomatic.  Medication list reconciled. Pt denies barriers to medicaiton compliance.  PSYCHOSOCIAL ASSESSMENT:  PHQ-5. Rachel Clark says she has spent a lot of time sleeping due to ongoing symptoms of feeling dizzy. Pt exhibits positive coping skills, hopeful  outlook with supportive husband. No psychosocial needs identified at this time, no psychosocial interventions necessary.    Pt enjoys playing scrabble.   Pt oriented to exercise equipment and routine.    Understanding verbalized. Rachel Clark did report feeling light headed after getting up from the nustep. This sent away after a few minutes. Rachel Clark says she has less symptoms since her coreg was stopped by Dr Clark.Will continue to monitor the patient throughout  the program. Rachel Gave RN BSN    Dr. Fransico Clark is Medical Director for Cardiac Rehab at [facility].

## 2021-07-01 ENCOUNTER — Ambulatory Visit (HOSPITAL_COMMUNITY): Payer: Medicare Other

## 2021-07-01 ENCOUNTER — Telehealth (HOSPITAL_COMMUNITY): Payer: Self-pay | Admitting: Family Medicine

## 2021-07-01 ENCOUNTER — Encounter (HOSPITAL_COMMUNITY): Payer: Medicare Other

## 2021-07-01 DIAGNOSIS — Z1231 Encounter for screening mammogram for malignant neoplasm of breast: Secondary | ICD-10-CM | POA: Diagnosis not present

## 2021-07-01 DIAGNOSIS — M85851 Other specified disorders of bone density and structure, right thigh: Secondary | ICD-10-CM | POA: Diagnosis not present

## 2021-07-01 DIAGNOSIS — Z780 Asymptomatic menopausal state: Secondary | ICD-10-CM | POA: Diagnosis not present

## 2021-07-01 DIAGNOSIS — M85852 Other specified disorders of bone density and structure, left thigh: Secondary | ICD-10-CM | POA: Diagnosis not present

## 2021-07-06 ENCOUNTER — Encounter (HOSPITAL_COMMUNITY)
Admission: RE | Admit: 2021-07-06 | Discharge: 2021-07-06 | Disposition: A | Discharge status: Home / Self Care | Payer: Medicare Other | Source: Ambulatory Visit | Attending: Cardiology | Admitting: Cardiology

## 2021-07-06 ENCOUNTER — Ambulatory Visit (HOSPITAL_COMMUNITY): Payer: Medicare Other

## 2021-07-06 ENCOUNTER — Other Ambulatory Visit: Payer: Self-pay

## 2021-07-06 DIAGNOSIS — Z955 Presence of coronary angioplasty implant and graft: Secondary | ICD-10-CM | POA: Insufficient documentation

## 2021-07-06 DIAGNOSIS — Z87891 Personal history of nicotine dependence: Secondary | ICD-10-CM | POA: Insufficient documentation

## 2021-07-06 DIAGNOSIS — I214 Non-ST elevation (NSTEMI) myocardial infarction: Secondary | ICD-10-CM | POA: Insufficient documentation

## 2021-07-06 DIAGNOSIS — Z20822 Contact with and (suspected) exposure to covid-19: Secondary | ICD-10-CM | POA: Diagnosis not present

## 2021-07-06 NOTE — Progress Notes (Signed)
Incomplete Session Note  Patient Details  Name: Rachel Clark MRN: [id number] Date of Birth: 1950/08/26 Referring Provider:   Flowsheet Row CARDIAC REHAB PHASE II ORIENTATION from 06/21/2021 in [facility]  Referring Provider Peter [name], MD       Rachel Clark did not complete her rehab session.  Rachel Clark reported feeling very light headed after walking in from the parking lot. Sitting blood pressure 122/60. Standing blood pressure 128/60. Heart rate 94. Telemetry rhythm Sinus. Oxygen saturation 99% on room air. Rachel Clark's husband reports that she did  not drink as much water as she usually does. Patient has her own gatorade Zero and drank the entire bottle. Feet elevated. Rachel Clark reports that she felt better repeat blood pressure 104/64. I advised Rachel Clark not to exercise today. Vin Bhagat PAC paged and notified about today's events. No new order received. Will forward to Dr [name] per Vin's request. Rachel Clark was instructed not to report to exercise if she feels bad. Patient and husband state understanding.Barnet Pall, RN,BSN 07/06/2021 2:13 PM

## 2021-07-08 ENCOUNTER — Ambulatory Visit (HOSPITAL_COMMUNITY): Payer: Medicare Other

## 2021-07-08 ENCOUNTER — Telehealth (HOSPITAL_COMMUNITY): Payer: Self-pay | Admitting: *Deleted

## 2021-07-08 ENCOUNTER — Encounter (HOSPITAL_COMMUNITY): Payer: Medicare Other

## 2021-07-08 NOTE — Telephone Encounter (Signed)
-----   Message from Peter M [name], MD sent at 07/07/2021  9:37 PM EST ----- Regarding: RE: light headed today No other recommendations. She does have orthostasis and if significantly dizzy may need to hold exercise that day  Peter [name] MD, [facility]   ----- Message ----- From: Magda Kiel, RN Sent: 07/06/2021   2:19 PM EST To: Peter M [name], MD, Luanna Salk, LPN Subject: light headed today                             Good afternoon Dr [name],  Tiyana reported feeling very light headed after walking in from the parking lot at cardiac rehab.  Sitting blood pressure 122/60. Standing blood pressure 128/60. Heart rate 94. Telemetry rhythm Sinus. Oxygen saturation 99% on room air. Aanika's husband reports that she did  not drink as much water as she usually does. Patient has her own gatorade Zero and drank the entire bottle. Feet elevated. Shali reports that she felt better repeat blood pressure 104/64. I advised Lillyen not to exercise today. Vin Bhagat PAC paged and notified about today's events. Cynde was wearing her compression stockings and has stopped taking her coreg as you instructed her to do. Naliya did not exercise today.  Please let us know if you have any further recommendations.   Thanks for your input Dr [name]!  Sincerely,  [facility]  Cardiac Rehab

## 2021-07-10 NOTE — Progress Notes (Signed)
Cardiology Office Note Date:  07/14/2021  Patient ID:  Rachel Clark, Rachel Clark October 15, 1950, MRN [id number] PCP:  Harlan Stains, MD  Cardiologist:  Dr. [name] Electrophysiologist: Dr. Rayann Heman    Chief Complaint: annual visit  History of Present Illness: Rachel Clark is a 71 y.o. female with history of CAD (CABG 2007, PCI 2017, 2019, March 2022, and Sept 6, 2022), HTN, HLD, SSSx w/PPM, seizure d/o, impaired memory  Dizziness, syncope, orthostatic issues known with normal PPM checks  She comes today to be seen for Dr .Rayann Heman, last saw him Jan 2022, not device dependent, rare ATach episodes that did not correlated with her symptoms, nitrate was stopped  Hospitalized 03/08/21 with CP, NSTEMI, cath with Successful PCI of the Ramus intermediate with DES x 3 in overlapping fashion  GIB Sept 11-15/2022, EGD showed a visible vessel in the gastric antrum treated with epinephrine injection and clipping, PPI, subsequent bleeding scan negative, DAPT continued given extensive stenting hx  She last saw Dr. [name] dec 2022 c/w orthostatic symptoms despite adequate hydration and compression hose. Advised sodium liberalization, her coreg stopped   TODAY She is accompanied by her husband She tells me that she never feels well has a fairly persistent sense of feeling off, or "wonky" Today is a better day however, has some of these here and there No CP, palpitations or cardiac awareness. No near syncope or syncope.  They reports her weak spells, "wonky" episodes, and low BP issues have been discussed at length with her PMD, neurologist and Dr. [name] I note her last 2 visits at [facility]: 1/11/223 reported feeling lightheaded and a little "wonky". I advised Karrie not to exercise if she does not feel well today. Hedaya did drink some water and had drank a whole bottle of Gatorade before arriving to exercise at cardiac rehab. Blood pressure 128/72 heart rate 98 Sinus rhythm oxygen saturation 100% on  room air  07/06/21 reported feeling very light headed after walking in from the parking lot. Sitting blood pressure 122/60. Standing blood pressure 128/60. Heart rate 94. Telemetry rhythm Sinus. Oxygen saturation 99% on room air.     Device information MDT dual chamber PPM implanted 03/25/2015   Past Medical History:  Diagnosis Date   Arthritis    "fingers" (06/29/2016)   Chronic lower back pain    Colon cancer ([place]) 12/04/2011   s/p Laparoscopic-assisted transverse colectomy on 12/19/2011 by Dr. Donne Hazel.  pT3 N0 M0.    Coronary artery disease    a. remote NSTEMI with PCI to the RCA; b. S/P CABG in 2007; c. 2010 - 2 v CAD with L-LAD and S-RCA patent, small caliber Dx 70% (treated medically - no amenable to PCI), CFX free of significant disease, normal LVF. d. 10/2014 Lexi MV: EF 61%, no ischemia/infarct.   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, controlled ([place]) 12/06/2011   last seizure was in 1972 ;takes Tegretol (06/29/2016)    Headache    "weekly" (06/29/2016)   Hyperlipidemia    Hypothyroid    Iron deficiency anemia 11/17/2011   Memory change 11/13/2016   Monoclonal gammopathy of unknown significance    Orthostatic hypotension 12/21/2020   Presence of permanent cardiac pacemaker    Syncope and collapse    pacemaker implanted    Past Surgical History:  Procedure Laterality Date   ANTERIOR CERVICAL DECOMP/DISCECTOMY FUSION  1980's   BACK  SURGERY     CARDIAC CATHETERIZATION  05/20/2009   obstructive native vessel disease in LAD, RCA, and first diagonal, patent vein graft to distal RCA and LIMA to LAD,normal. ef 60%   CARDIAC CATHETERIZATION N/A 03/24/2015   Procedure: Left Heart Cath and Cors/Grafts Angiography;  Surgeon: Wellington Hampshire, MD;  Location: [facility];  Service: Cardiovascular;  Laterality: N/A;   CARDIAC CATHETERIZATION N/A 07/28/2015   Procedure: Left Heart Cath  and Coronary Angiography;  Surgeon: Troy Sine, MD;  Location: [facility];  Service: Cardiovascular;  Laterality: N/A;   CARDIAC CATHETERIZATION N/A 11/09/2015   Procedure: Coronary Stent Intervention;  Surgeon: Sherren Mocha, MD;  Location: [facility];  Service: Cardiovascular;  Laterality: N/A;   CARDIAC CATHETERIZATION N/A 11/09/2015   Procedure: Left Heart Cath and Coronary Angiography;  Surgeon: Sherren Mocha, MD;  Location: [facility];  Service: Cardiovascular;  Laterality: N/A;   CARDIAC CATHETERIZATION N/A 06/29/2016   Procedure: Left Heart Cath and Coronary Angiography;  Surgeon: Nelva Bush, MD;  Location: [facility];  Service: Cardiovascular;  Laterality: N/A;   CARDIAC CATHETERIZATION N/A 06/29/2016   Procedure: Intravascular Pressure Wire/FFR Study;  Surgeon: Nelva Bush, MD;  Location: [facility];  Service: Cardiovascular;  Laterality: N/A;   CARDIAC CATHETERIZATION N/A 06/29/2016   Procedure: Coronary Balloon Angioplasty;  Surgeon: Nelva Bush, MD;  Location: [facility];  Service: Cardiovascular;  Laterality: N/A;   COLON RESECTION  12/19/2011   Procedure: COLON RESECTION LAPAROSCOPIC;  Surgeon: Rolm Bookbinder, MD;  Location: [facility];  Service: General;  Laterality: N/A;  laparoscopic hand assisted partial colon resection   COLON SURGERY     COLONOSCOPY     CORONARY ANGIOPLASTY WITH STENT PLACEMENT  ~ 2007   1 stent   CORONARY ARTERY BYPASS GRAFT  2007   "CABG X2"   DILATION AND CURETTAGE OF UTERUS  1973   EP IMPLANTABLE DEVICE N/A 03/25/2015   MDT Advisa DR pacemaker implanted by Dr Rayann Heman for transient complete heart block and syncope   ESOPHAGOGASTRODUODENOSCOPY (EGD) WITH PROPOFOL N/A 03/14/2021   Procedure: ESOPHAGOGASTRODUODENOSCOPY (EGD) WITH PROPOFOL;  Surgeon: Ronnette Juniper, MD;  Location: [facility];  Service: Gastroenterology;  Laterality: N/A;   HEMOSTASIS CLIP PLACEMENT  03/14/2021   Procedure: HEMOSTASIS  CLIP PLACEMENT;  Surgeon: Ronnette Juniper, MD;  Location: [facility];  Service: Gastroenterology;;   INSERT / REPLACE / [facility] CATH AND CORS/GRAFTS ANGIOGRAPHY N/A 02/21/2018   Procedure: LEFT HEART CATH AND CORS/GRAFTS ANGIOGRAPHY;  Surgeon: Lorretta Harp, MD;  Location: [facility];  Service: Cardiovascular;  Laterality: N/A;   LEFT HEART CATH AND CORS/GRAFTS ANGIOGRAPHY N/A 03/08/2021   Procedure: LEFT HEART CATH AND CORS/GRAFTS ANGIOGRAPHY;  Surgeon: [name], Peter M, MD;  Location: [facility];  Service: Cardiovascular;  Laterality: N/A;   PORT-A-CATH REMOVAL N/A 06/12/2013   Procedure: REMOVAL PORT-A-CATH;  Surgeon: Rolm Bookbinder, MD;  Location: [facility];  Service: General;  Laterality: N/A;   PORTACATH PLACEMENT  06/04/2012   Procedure: INSERTION PORT-A-CATH;  Surgeon: Rolm Bookbinder, MD;  Location: [facility];  Service: General;  Laterality: N/A;  Insertion of port-a-cath    POSTERIOR LAMINECTOMY / [place]  03/14/2021   Procedure: SCLEROTHERAPY;  Surgeon: Ronnette Juniper, MD;  Location: [facility];  Service: Gastroenterology;;   VAGINAL HYSTERECTOMY      Current Outpatient Medications  Medication  Sig Dispense Refill   acetaminophen (TYLENOL) 500 MG tablet Take 500-1,000 mg by mouth daily as needed for mild pain or headache.     aspirin 81 MG chewable tablet Chew 1 tablet (81 mg total) by mouth daily.     Azelastine HCl 0.15 % SOLN Place 1 spray into both nostrils daily as needed (seasonal allergies).  12   Bempedoic Acid-Ezetimibe (NEXLIZET) 180-10 MG TABS Take 180 mg by mouth daily. 90 tablet 3   calcium carbonate (TUMS - DOSED IN MG ELEMENTAL CALCIUM) 500 MG chewable tablet Chew 500 mg by mouth daily.     carbamazepine (TEGRETOL XR) 200 MG 12 hr tablet Take 3 tablets (600 mg total) by mouth 2 (two) times daily. 180 tablet 11   Cholecalciferol (VITAMIN D) 50 MCG (2000 UT) CAPS Take 2,000  Units by mouth daily.     DULoxetine (CYMBALTA) 60 MG capsule Take 60 mg by mouth 2 (two) times daily.     ethosuximide (ZARONTIN) 250 MG capsule TAKE 2 CAPSULES IN THE MORNING, TAKE 2 CAPSULES AT NIGHT 360 capsule 3   levothyroxine (SYNTHROID, LEVOTHROID) 50 MCG tablet Take 50 mcg by mouth daily before breakfast.     memantine (NAMENDA) 10 MG tablet TAKE 1 TABLET BY MOUTH TWICE A DAY 180 tablet 1   nitroGLYCERIN (NITROSTAT) 0.4 MG SL tablet Place 1 tablet (0.4 mg total) under the tongue every 5 (five) minutes as needed. For chest pain. 25 tablet 11   prasugrel (EFFIENT) 10 MG TABS tablet Take 1 tablet (10 mg total) by mouth daily. 90 tablet 3   tiZANidine (ZANAFLEX) 2 MG tablet Take 2 mg by mouth every 6 (six) hours as needed for muscle spasms.     pantoprazole (PROTONIX) 40 MG tablet Take 1 tablet (40 mg total) by mouth 2 (two) times daily. AFTER 2 MONTHS CONT AS ONCE A DAY (Patient taking differently: Take 40 mg by mouth daily.) 60 tablet 1   No current facility-administered medications for this visit.    Allergies:   Sulfamethoxazole, Aspirin, Crestor [rosuvastatin], Erythromycin, Flonase [fluticasone], Lisinopril, Niacin and related, Statins, Sulfa drugs cross reactors, Tetracyclines & related, Xeloda [capecitabine], Penicillins, and Plavix [clopidogrel bisulfate]   Social History:  The patient  reports that she quit smoking about 40 years ago. Her smoking use included cigarettes. She has a 6.00 pack-year smoking history. She has never used smokeless tobacco. She reports current alcohol use of about 1.0 standard drink per week. She reports that she does not use drugs.   Family History:  The patient's family history includes Cancer in her mother; Heart attack in her father; Heart attack (age of onset: 46) in her brother; Heart disease in her father.  ROS:  Please see the history of present illness.    All other systems are reviewed and otherwise negative.   PHYSICAL EXAM:  VS:  BP (!)  156/80    Pulse 90    Ht 5\' 7"  (1.702 m)    Wt 178 lb (80.7 kg)    SpO2 96%    BMI 27.88 kg/m  BMI: Body mass index is 27.88 kg/m. Well nourished, well developed, in no acute distress HEENT: normocephalic, atraumatic Neck: no JVD, carotid bruits or masses Cardiac:  RRR; no significant murmurs, no rubs, or gallops Lungs:  CTA b/l, no wheezing, rhonchi or rales Abd: soft, nontender MS: no deformity or atrophy Ext: no edema Skin: warm and dry, no rash Neuro:  No gross deficits appreciated Psych: euthymic mood, full affect  PPM site is stable, no tethering or discomfort   EKG:  not done today  Device interrogation done today and reviewed by myself:  Battery and lead measurements are stable 11 fast AV episodes since May 2022 are 1:1 tachycardias, not new for her\not symptomatic, do not explain her symptoms   Echo 07/30/20 IMPRESSIONS   1. Left ventricular ejection fraction, by estimation, is 60 to 65%. The  left ventricle has normal function. The left ventricle has no regional  wall motion abnormalities. Left ventricular diastolic parameters are  consistent with Grade I diastolic  dysfunction (impaired relaxation).   2. Right ventricular systolic function is normal. The right ventricular  size is normal.   3. The mitral valve is normal in structure. Trivial mitral valve  regurgitation. No evidence of mitral stenosis.   4. The aortic valve is tricuspid. Aortic valve regurgitation is not  visualized. Mild aortic valve sclerosis is present, with no evidence of  aortic valve stenosis.   5. The inferior vena cava is normal in size with greater than 50%  respiratory variability, suggesting right atrial pressure of 3 mmHg.   Comparison(s): Prior images unable to be directly viewed, comparison made  by report only. No significant change from prior study. 07/26/15 EF 60-65%.  GLS -18.6%.    Cardiac cath 03/08/21:  LEFT HEART CATH AND CORS/GRAFTS ANGIOGRAPHY    Conclusion       Prox  LAD lesion is 100% stenosed.   Origin lesion is 100% stenosed.   Ost LAD to Prox LAD lesion is 60% stenosed.   Ost 1st Diag to 1st Diag lesion is 85% stenosed.   Ramus lesion is 99% stenosed.   Ost Ramus to Ramus lesion is 90% stenosed.   Non-stenotic Ost RCA to Mid RCA lesion was previously treated.   A drug-eluting stent was successfully placed using a STENT ONYX FRONTIER 3.0X15.   A drug-eluting stent was successfully placed using a SYNERGY XD 3.0X16.   A drug-eluting stent was successfully placed using a SYNERGY XD 3.0X20.   Post intervention, there is a 0% residual stenosis.   Post intervention, there is a 0% residual stenosis.   The left ventricular systolic function is normal.   LV end diastolic pressure is normal.   The left ventricular ejection fraction is 55-65% by visual estimate.   The radiation dose exceeded thresholds defined in the "Patient Radiation Dose Management For Interventional Medical Procedures With Extensive Use of Fluoroscopy" policy. Specific follow up instructions will be provided to the patient prior to discharge.   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 This is the culprit Normal LV function Normal LVEDP Successful PCI of the Ramus intermediate with DES x 3 in overlapping fashion.   Plan: DAPT for one year. Anticipate possible DC tomorrow.    Recent Labs: 03/13/2021: ALT 14 03/17/2021: BUN 16; Creatinine, Ser 0.68; Hemoglobin 9.6; Platelets 221; Potassium 3.9; Sodium 133  03/09/2021: Cholesterol 157; HDL 68; LDL Cholesterol 72; Total CHOL/HDL Ratio 2.3; Triglycerides 86; VLDL 17   CrCl cannot be calculated (Patient's most recent lab result is older than the maximum 21 days allowed.).   Wt Readings from Last 3 Encounters:  07/14/21 178 lb (80.7 kg)  06/21/21 174 lb 2.6 oz (79 kg)  06/20/21 174 lb  14.4 oz (79.3 kg)     Other studies reviewed:  Additional studies/records reviewed today include: summarized above  ASSESSMENT AND PLAN:  PPM Intact function No programming changes made  CAD Indefinite DAPT GIB Sept 2022 BP does not allow BB, nitrates C/w Dr. [name] and team  HTN Orthostatic dizziness, hypotension Off BP meds Will defer ongoing management to Dr. [name], [place], and neurology teams  ATach <0.1%  burden Without symptoms, longest 52min52sec  Disposition: F/u with remotes as usual, in clinic with EP in a year, they request Dr. Caryl Comes  Current medicines are reviewed at length with the patient today.  The patient did not have any concerns regarding medicines.  Venetia Night, PA-C 07/14/2021 5:14 PM     [address] [place] [address] [phone] (office)  [phone] (fax)

## 2021-07-11 ENCOUNTER — Encounter (HOSPITAL_COMMUNITY)
Admission: RE | Admit: 2021-07-11 | Discharge: 2021-07-11 | Disposition: A | Discharge status: Home / Self Care | Payer: Medicare Other | Source: Ambulatory Visit | Attending: Cardiology | Admitting: Cardiology

## 2021-07-11 ENCOUNTER — Other Ambulatory Visit: Payer: Self-pay

## 2021-07-11 ENCOUNTER — Ambulatory Visit (INDEPENDENT_AMBULATORY_CARE_PROVIDER_SITE_OTHER): Payer: Medicare Other

## 2021-07-11 DIAGNOSIS — Z955 Presence of coronary angioplasty implant and graft: Secondary | ICD-10-CM | POA: Diagnosis not present

## 2021-07-11 DIAGNOSIS — I214 Non-ST elevation (NSTEMI) myocardial infarction: Secondary | ICD-10-CM | POA: Diagnosis not present

## 2021-07-11 DIAGNOSIS — I495 Sick sinus syndrome: Secondary | ICD-10-CM | POA: Diagnosis not present

## 2021-07-11 DIAGNOSIS — Z87891 Personal history of nicotine dependence: Secondary | ICD-10-CM | POA: Diagnosis not present

## 2021-07-12 LAB — CUP PACEART REMOTE DEVICE CHECK
Battery Remaining Longevity: 66 mo
Battery Voltage: 3 V
Brady Statistic AP VP Percent: 0.01 %
Brady Statistic AP VS Percent: 0.65 %
Brady Statistic AS VP Percent: 0.06 %
Brady Statistic AS VS Percent: 99.29 %
Brady Statistic RA Percent Paced: 0.65 %
Brady Statistic RV Percent Paced: 0.06 %
Date Time Interrogation Session: 20230110105756
Implantable Lead Implant Date: 20160922
Implantable Lead Implant Date: 20160922
Implantable Lead Location: 753859
Implantable Lead Location: 753860
Implantable Lead Model: 5076
Implantable Lead Model: 5076
Implantable Pulse Generator Implant Date: 20160922
Lead Channel Impedance Value: 1406 Ohm
Lead Channel Impedance Value: 1425 Ohm
Lead Channel Impedance Value: 399 Ohm
Lead Channel Impedance Value: 456 Ohm
Lead Channel Pacing Threshold Amplitude: 0.75 V
Lead Channel Pacing Threshold Amplitude: 1 V
Lead Channel Pacing Threshold Pulse Width: 0.4 ms
Lead Channel Pacing Threshold Pulse Width: 0.4 ms
Lead Channel Sensing Intrinsic Amplitude: 3.5 mV
Lead Channel Sensing Intrinsic Amplitude: 3.5 mV
Lead Channel Sensing Intrinsic Amplitude: 8.5 mV
Lead Channel Sensing Intrinsic Amplitude: 8.5 mV
Lead Channel Setting Pacing Amplitude: 2.5 V
Lead Channel Setting Pacing Amplitude: 2.5 V
Lead Channel Setting Pacing Pulse Width: 0.4 ms
Lead Channel Setting Sensing Sensitivity: 2.8 mV

## 2021-07-13 ENCOUNTER — Other Ambulatory Visit: Payer: Self-pay

## 2021-07-13 ENCOUNTER — Encounter (HOSPITAL_COMMUNITY)
Admission: RE | Admit: 2021-07-13 | Discharge: 2021-07-13 | Disposition: A | Discharge status: Home / Self Care | Payer: Medicare Other | Source: Ambulatory Visit | Attending: Cardiology | Admitting: Cardiology

## 2021-07-13 DIAGNOSIS — I214 Non-ST elevation (NSTEMI) myocardial infarction: Secondary | ICD-10-CM

## 2021-07-13 DIAGNOSIS — Z955 Presence of coronary angioplasty implant and graft: Secondary | ICD-10-CM

## 2021-07-13 NOTE — Progress Notes (Signed)
Incomplete Session Note  Patient Details  Name: Rachel Clark MRN: [id number] Date of Birth: 24-Nov-1950 Referring Provider:   Flowsheet Row CARDIAC REHAB PHASE II ORIENTATION from 06/21/2021 in [facility]  Referring Provider Peter [name], MD       [name] did not complete her rehab session.  [name] reported feeling lightheaded and a little "wonky". I advised [name] not to exercise if she does not feel well today. [name] did drink some water and had drank a whole bottle of Gatorade before arriving to exercise at cardiac rehab. Blood pressure 128/72 heart rate 98 Sinus rhythm oxygen saturation 100% on room air. [name] was assisted to the lobby to her husband. [name] hopes to return to exercise on Friday if she is feeling better.Barnet Pall, RN,BSN 07/13/2021 1:23 PM

## 2021-07-14 ENCOUNTER — Encounter: Payer: Self-pay | Admitting: Physician Assistant

## 2021-07-14 ENCOUNTER — Ambulatory Visit (INDEPENDENT_AMBULATORY_CARE_PROVIDER_SITE_OTHER): Payer: Medicare Other | Admitting: Physician Assistant

## 2021-07-14 VITALS — BP 156/80 | HR 90 | Ht 67.0 in | Wt 178.0 lb

## 2021-07-14 DIAGNOSIS — Z950 Presence of cardiac pacemaker: Secondary | ICD-10-CM

## 2021-07-14 DIAGNOSIS — I4719 Other supraventricular tachycardia: Secondary | ICD-10-CM

## 2021-07-14 DIAGNOSIS — I2510 Atherosclerotic heart disease of native coronary artery without angina pectoris: Secondary | ICD-10-CM

## 2021-07-14 DIAGNOSIS — I495 Sick sinus syndrome: Secondary | ICD-10-CM

## 2021-07-14 DIAGNOSIS — R42 Dizziness and giddiness: Secondary | ICD-10-CM | POA: Diagnosis not present

## 2021-07-14 DIAGNOSIS — I471 Supraventricular tachycardia: Secondary | ICD-10-CM | POA: Diagnosis not present

## 2021-07-14 DIAGNOSIS — R55 Syncope and collapse: Secondary | ICD-10-CM | POA: Diagnosis not present

## 2021-07-14 LAB — CUP PACEART INCLINIC DEVICE CHECK
Battery Remaining Longevity: 66 mo
Battery Voltage: 2.99 V
Brady Statistic AP VP Percent: 0.01 %
Brady Statistic AP VS Percent: 0.79 %
Brady Statistic AS VP Percent: 0.06 %
Brady Statistic AS VS Percent: 99.14 %
Brady Statistic RA Percent Paced: 0.8 %
Brady Statistic RV Percent Paced: 0.06 %
Date Time Interrogation Session: 20230112192716
Implantable Lead Implant Date: 20160922
Implantable Lead Implant Date: 20160922
Implantable Lead Location: 753859
Implantable Lead Location: 753860
Implantable Lead Model: 5076
Implantable Lead Model: 5076
Implantable Pulse Generator Implant Date: 20160922
Lead Channel Impedance Value: 1387 Ohm
Lead Channel Impedance Value: 1425 Ohm
Lead Channel Impedance Value: 418 Ohm
Lead Channel Impedance Value: 494 Ohm
Lead Channel Pacing Threshold Amplitude: 0.75 V
Lead Channel Pacing Threshold Amplitude: 1 V
Lead Channel Pacing Threshold Pulse Width: 0.4 ms
Lead Channel Pacing Threshold Pulse Width: 0.4 ms
Lead Channel Sensing Intrinsic Amplitude: 14.25 mV
Lead Channel Sensing Intrinsic Amplitude: 2.625 mV
Lead Channel Sensing Intrinsic Amplitude: 3.125 mV
Lead Channel Sensing Intrinsic Amplitude: 8.75 mV
Lead Channel Setting Pacing Amplitude: 2.5 V
Lead Channel Setting Pacing Amplitude: 2.5 V
Lead Channel Setting Pacing Pulse Width: 0.4 ms
Lead Channel Setting Sensing Sensitivity: 2.8 mV

## 2021-07-14 NOTE — Patient Instructions (Signed)
Medication Instructions:    Your physician recommends that you continue on your current medications as directed. Please refer to the Current Medication list given to you today.   *If you need a refill on your cardiac medications before your next appointment, please call your pharmacy*   Lab Work: [facility]    If you have labs (blood work) drawn today and your tests are completely normal, you will receive your results only by: [facility] (if you have MyChart) OR A paper copy in the mail If you have any lab test that is abnormal or we need to change your treatment, we will call you to review the results.   Testing/Procedures: NONE ORDERED  TODAY      Follow-Up: At [facility], you and your health needs are our priority.  As part of our continuing mission to provide you with exceptional heart care, we have created designated Provider Care Teams.  These Care Teams include your primary Cardiologist (physician) and Advanced Practice Providers (APPs -  Physician Assistants and Nurse Practitioners) who all work together to provide you with the care you need, when you need it.  We recommend signing up for the patient portal called "MyChart".  Sign up information is provided on this After Visit Summary.  MyChart is used to connect with patients for Virtual Visits (Telemedicine).  Patients are able to view lab/test results, encounter notes, upcoming appointments, etc.  Non-urgent messages can be sent to your provider as well.   To learn more about what you can do with MyChart, go to [email or web address].    Your next appointment:    1 year(s)  The format for your next appointment:   In Person  Provider:   Virl Axe, MD   Other Instructions

## 2021-07-15 ENCOUNTER — Encounter (HOSPITAL_COMMUNITY): Payer: Medicare Other

## 2021-07-15 DIAGNOSIS — F331 Major depressive disorder, recurrent, moderate: Secondary | ICD-10-CM | POA: Diagnosis not present

## 2021-07-15 DIAGNOSIS — E039 Hypothyroidism, unspecified: Secondary | ICD-10-CM | POA: Diagnosis not present

## 2021-07-15 DIAGNOSIS — I10 Essential (primary) hypertension: Secondary | ICD-10-CM | POA: Diagnosis not present

## 2021-07-15 DIAGNOSIS — E785 Hyperlipidemia, unspecified: Secondary | ICD-10-CM | POA: Diagnosis not present

## 2021-07-18 ENCOUNTER — Other Ambulatory Visit: Payer: Self-pay

## 2021-07-18 ENCOUNTER — Encounter (HOSPITAL_COMMUNITY)
Admission: RE | Admit: 2021-07-18 | Discharge: 2021-07-18 | Disposition: A | Discharge status: Home / Self Care | Payer: Medicare Other | Source: Ambulatory Visit | Attending: Cardiology | Admitting: Cardiology

## 2021-07-18 DIAGNOSIS — Z955 Presence of coronary angioplasty implant and graft: Secondary | ICD-10-CM | POA: Diagnosis not present

## 2021-07-18 DIAGNOSIS — Z87891 Personal history of nicotine dependence: Secondary | ICD-10-CM | POA: Diagnosis not present

## 2021-07-18 DIAGNOSIS — I214 Non-ST elevation (NSTEMI) myocardial infarction: Secondary | ICD-10-CM

## 2021-07-19 NOTE — Progress Notes (Signed)
Cardiac Individual Treatment Plan  Patient Details  Name: Rachel Clark MRN: [id number] Date of Birth: Nov 19, 1950 Referring Provider:   Flowsheet Row CARDIAC REHAB PHASE II ORIENTATION from 06/21/2021 in [facility]  Referring Provider Peter [name], MD       Initial Encounter Date:  [place] PHASE II ORIENTATION from 06/21/2021 in [facility]  Date 06/21/21       Visit Diagnosis: 03/08/21 NSTEMI   03/08/21 S/P DES x 3 RAMUS  Patient's Home Medications on Admission:  Current Outpatient Medications:    acetaminophen (TYLENOL) 500 MG tablet, Take 500-1,000 mg by mouth daily as needed for mild pain or headache., Disp: , Rfl:    aspirin 81 MG chewable tablet, Chew 1 tablet (81 mg total) by mouth daily., Disp: , Rfl:    Azelastine HCl 0.15 % SOLN, Place 1 spray into both nostrils daily as needed (seasonal allergies)., Disp: , Rfl: 12   Bempedoic Acid-Ezetimibe (NEXLIZET) 180-10 MG TABS, Take 180 mg by mouth daily., Disp: 90 tablet, Rfl: 3   calcium carbonate (TUMS - DOSED IN MG ELEMENTAL CALCIUM) 500 MG chewable tablet, Chew 500 mg by mouth daily., Disp: , Rfl:    carbamazepine (TEGRETOL XR) 200 MG 12 hr tablet, Take 3 tablets (600 mg total) by mouth 2 (two) times daily., Disp: 180 tablet, Rfl: 11   Cholecalciferol (VITAMIN D) 50 MCG (2000 UT) CAPS, Take 2,000 Units by mouth daily., Disp: , Rfl:    DULoxetine (CYMBALTA) 60 MG capsule, Take 60 mg by mouth 2 (two) times daily., Disp: , Rfl:    ethosuximide (ZARONTIN) 250 MG capsule, TAKE 2 CAPSULES IN THE MORNING, TAKE 2 CAPSULES AT NIGHT, Disp: 360 capsule, Rfl: 3   levothyroxine (SYNTHROID, LEVOTHROID) 50 MCG tablet, Take 50 mcg by mouth daily before breakfast., Disp: , Rfl:    memantine (NAMENDA) 10 MG tablet, TAKE 1 TABLET BY MOUTH TWICE A DAY, Disp: 180 tablet, Rfl: 1   nitroGLYCERIN (NITROSTAT) 0.4 MG SL tablet, Place 1 tablet (0.4 mg total) under the  tongue every 5 (five) minutes as needed. For chest pain., Disp: 25 tablet, Rfl: 11   pantoprazole (PROTONIX) 40 MG tablet, Take 1 tablet (40 mg total) by mouth 2 (two) times daily. AFTER 2 MONTHS CONT AS ONCE A DAY (Patient taking differently: Take 40 mg by mouth daily.), Disp: 60 tablet, Rfl: 1   prasugrel (EFFIENT) 10 MG TABS tablet, Take 1 tablet (10 mg total) by mouth daily., Disp: 90 tablet, Rfl: 3   tiZANidine (ZANAFLEX) 2 MG tablet, Take 2 mg by mouth every 6 (six) hours as needed for muscle spasms., Disp: , Rfl:   Past Medical History: Past Medical History:  Diagnosis Date   Arthritis    "fingers" (06/29/2016)   Chronic lower back pain    Colon cancer ([place]) 12/04/2011   s/p Laparoscopic-assisted transverse colectomy on 12/19/2011 by Dr. Donne Hazel.  pT3 N0 M0.    Coronary artery disease    a. remote NSTEMI with PCI to the RCA; b. S/P CABG in 2007; c. 2010 - 2 v CAD with L-LAD and S-RCA patent, small caliber Dx 70% (treated medically - no amenable to PCI), CFX free of significant disease, normal LVF. d. 10/2014 Lexi MV: EF 61%, no ischemia/infarct.   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  last seizure was in 1972 ;takes Tegretol (06/29/2016)    Headache    "weekly" (06/29/2016)   Hyperlipidemia    Hypothyroid    Iron deficiency anemia 11/17/2011   Memory change 11/13/2016   Monoclonal gammopathy of unknown significance    Orthostatic hypotension 12/21/2020   Presence of permanent cardiac pacemaker    Syncope and collapse    pacemaker implanted    Tobacco Use: Social History   Tobacco Use  Smoking Status Former   Packs/day: 0.30   Years: 20.00   Pack years: 6.00   Types: Cigarettes   Quit date: 06/06/1981   Years since quitting: 40.1  Smokeless Tobacco Never    Labs: Recent Review Flowsheet Data     Labs for ITP Cardiac and Pulmonary Rehab Latest  Ref Rng & Units 12/17/2020 01/19/2021 03/08/2021 03/09/2021 03/13/2021   Cholestrol 0 - 200 mg/dL 261(H) 176 - 157 -   LDLCALC 0 - 99 mg/dL 151(H) 52 - 72 -   HDL >40 mg/dL 86 82 - 68 -   Trlycerides <150 mg/dL 141 276(H) - 86 -   Hemoglobin A1c 4.8 - 5.6 % - - - - -   PHART 7.350 - 7.450 - - 7.213(L) - -   PCO2ART 32.0 - 48.0 mmHg - - 46.1 - -   HCO3 20.0 - 28.0 mmol/L - - 18.5(L) - -   TCO2 22 - 32 mmol/L - - 20(L) - 24   ACIDBASEDEF 0.0 - 2.0 mmol/L - - 9.0(H) - -   O2SAT % - - 97.0 - -       Capillary Blood Glucose: Lab Results  Component Value Date   GLUCAP 140 (H) 07/27/2015     Exercise Target Goals: Exercise Program Goal: Individual exercise prescription set using results from initial 6 min walk test and THRR while considering  patients activity barriers and safety.   Exercise Prescription Goal: Starting with aerobic activity 30 plus minutes a day, 3 days per week for initial exercise prescription. Provide home exercise prescription and guidelines that participant acknowledges understanding prior to discharge.  Activity Barriers & Risk Stratification:  Activity Barriers & Cardiac Risk Stratification - 06/14/21 1111       Activity Barriers & Cardiac Risk Stratification   Activity Barriers Fibromyalgia;Deconditioning;Muscular Weakness;Balance Concerns;History of Falls;Other (comment)    Comments Chronic Fatiigue, lighthededness/dizziness/orthostasis    Cardiac Risk Stratification High             6 Minute Walk:  6 Minute Walk     Row Name 06/21/21 1150         6 Minute Walk   Phase Initial     Distance 1064 feet     Walk Time 6 minutes     # of Rest Breaks 0     MPH 2.02     METS 2.72     RPE 9     Perceived Dyspnea  0     VO2 Peak 9.52     Symptoms No     Resting HR 96 bpm     Resting BP 144/80     Resting Oxygen Saturation  99 %     Exercise Oxygen Saturation  during 6 min walk 99 %     Max Ex. HR 108 bpm     Max Ex. BP 158/82     2 Minute Post  BP 138/80              Oxygen Initial Assessment:   Oxygen Re-Evaluation:  Oxygen Discharge (Final Oxygen Re-Evaluation):   Initial Exercise Prescription:  Initial Exercise Prescription - 06/21/21 1300       Date of Initial Exercise RX and Referring Provider   Date 06/21/21    Referring Provider Peter [name], MD    Expected Discharge Date 08/12/21      NuStep   Level 1    SPM 75    Minutes 25    METs 2      Prescription Details   Frequency (times per week) 3    Duration Progress to 30 minutes of continuous aerobic without signs/symptoms of physical distress      Intensity   THRR 40-80% of Max Heartrate 60-119    Ratings of Perceived Exertion 11-13    Perceived Dyspnea 0-4      Progression   Progression Continue progressive overload as per policy without signs/symptoms or physical distress.      Resistance Training   Training Prescription Yes    Weight 2 lb    Reps 10-15             Perform Capillary Blood Glucose checks as needed.  Exercise Prescription Changes:   Exercise Prescription Changes     Row Name 06/29/21 1600             Response to Exercise   Blood Pressure (Admit) 138/82       Blood Pressure (Exercise) 138/70       Blood Pressure (Exit) 108/74       Heart Rate (Admit) 95 bpm       Heart Rate (Exercise) 115 bpm       Heart Rate (Exit) 87 bpm       Rating of Perceived Exertion (Exercise) 10       Symptoms Lightheadedness after the nustep upon standing       Comments Pt's first day in the CRP2 program       Duration Progress to 30 minutes of  aerobic without signs/symptoms of physical distress       Intensity THRR unchanged         Progression   Progression Continue to progress workloads to maintain intensity without signs/symptoms of physical distress.       Average METs 2.2         Resistance Training   Training Prescription No       Weight No weights on Wednesdays         Interval Training   Interval Training No          NuStep   Level 1       SPM 80       Minutes 25       METs 2.2                Exercise Comments:   Exercise Comments     Row Name 06/14/21 1116 06/21/21 1402 06/29/21 1628 07/18/21 1637     Exercise Comments Unable to proceed with 6-minute walk test due to symptomatic orthostatic BP/lightheadedness Pt returned today to complete 6-minute walk test. Pt was able to complete the walk test today with the aid of a rollator and no rest breaks. Tolerated well. Pt's first day in the CRP2 program. Pt has her ongoing lightheadedness which she experienced after standing up from the nustep. Pt voices that it improved with walking to chair. Pt was assisted by staff for safety. Will continue to montior patient's hemodynamic reponse to the exercise bout as well as symptoms of  lightheadedness. Pt's attendance has been sporadic due to her ongoing issues with lightheadedness. Will address MET level once pt can establish consistant attendance.             Exercise Goals and Review:   Exercise Goals     Row Name 06/14/21 1113             Exercise Goals   Increase Physical Activity Yes       Intervention Provide advice, education, support and counseling about physical activity/exercise needs.;Develop an individualized exercise prescription for aerobic and resistive training based on initial evaluation findings, risk stratification, comorbidities and participant's personal goals.       Expected Outcomes Short Term: Attend rehab on a regular basis to increase amount of physical activity.;Long Term: Add in home exercise to make exercise part of routine and to increase amount of physical activity.;Long Term: Exercising regularly at least 3-5 days a week.       Increase Strength and Stamina Yes       Intervention Provide advice, education, support and counseling about physical activity/exercise needs.;Develop an individualized exercise prescription for aerobic and resistive training based on  initial evaluation findings, risk stratification, comorbidities and participant's personal goals.       Expected Outcomes Short Term: Increase workloads from initial exercise prescription for resistance, speed, and METs.;Short Term: Perform resistance training exercises routinely during rehab and add in resistance training at home;Long Term: Improve cardiorespiratory fitness, muscular endurance and strength as measured by increased METs and functional capacity (6MWT)       Able to understand and use rate of perceived exertion (RPE) scale Yes       Intervention Provide education and explanation on how to use RPE scale       Expected Outcomes Short Term: Able to use RPE daily in rehab to express subjective intensity level;Long Term:  Able to use RPE to guide intensity level when exercising independently       Knowledge and understanding of Target Heart Rate Range (THRR) Yes       Intervention Provide education and explanation of THRR including how the numbers were predicted and where they are located for reference       Expected Outcomes Short Term: Able to state/look up THRR;Long Term: Able to use THRR to govern intensity when exercising independently;Short Term: Able to use daily as guideline for intensity in rehab       Understanding of Exercise Prescription Yes       Intervention Provide education, explanation, and written materials on patient's individual exercise prescription       Expected Outcomes Short Term: Able to explain program exercise prescription;Long Term: Able to explain home exercise prescription to exercise independently                Exercise Goals Re-Evaluation :  Exercise Goals Re-Evaluation     Row Name 06/29/21 1625             Exercise Goal Re-Evaluation   Exercise Goals Review Increase Physical Activity;Increase Strength and Stamina;Able to understand and use rate of perceived exertion (RPE) scale;Knowledge and understanding of Target Heart Rate Range  (THRR);Understanding of Exercise Prescription       Comments Pt's first day in the CRP2 program. Pt understands the exercise Rx, THRR and RPE scale.       Expected Outcomes Will continue to monitor patient and porgress exercise workloads as tolerated.  Discharge Exercise Prescription (Final Exercise Prescription Changes):  Exercise Prescription Changes - 06/29/21 1600       Response to Exercise   Blood Pressure (Admit) 138/82    Blood Pressure (Exercise) 138/70    Blood Pressure (Exit) 108/74    Heart Rate (Admit) 95 bpm    Heart Rate (Exercise) 115 bpm    Heart Rate (Exit) 87 bpm    Rating of Perceived Exertion (Exercise) 10    Symptoms Lightheadedness after the nustep upon standing    Comments Pt's first day in the CRP2 program    Duration Progress to 30 minutes of  aerobic without signs/symptoms of physical distress    Intensity THRR unchanged      Progression   Progression Continue to progress workloads to maintain intensity without signs/symptoms of physical distress.    Average METs 2.2      Resistance Training   Training Prescription No    Weight No weights on Wednesdays      Interval Training   Interval Training No      NuStep   Level 1    SPM 80    Minutes 25    METs 2.2             Nutrition:  Target Goals: Understanding of nutrition guidelines, daily intake of sodium <1522m, cholesterol <2031m calories 30% from fat and 7% or less from saturated fats, daily to have 5 or more servings of fruits and vegetables.  Biometrics:  Pre Biometrics - 06/14/21 1113       Pre Biometrics   Waist Circumference 41.5 inches    Hip Circumference 45 inches    Waist to Hip Ratio 0.92 %    Triceps Skinfold 20 mm    % Body Fat 39.1 %    Grip Strength 17 kg    Flexibility 14.5 in    Single Leg Stand 2.12 seconds              Nutrition Therapy Plan and Nutrition Goals:   Nutrition Assessments:  MEDIFICTS Score Key: ?70 Need to make  dietary changes  40-70 Heart Healthy Diet ? 40 Therapeutic Level Cholesterol Diet   Picture Your Plate Scores: <4<84nhealthy dietary pattern with much room for improvement. 41-50 Dietary pattern unlikely to meet recommendations for good health and room for improvement. 51-60 More healthful dietary pattern, with some room for improvement.  >60 Healthy dietary pattern, although there may be some specific behaviors that could be improved.    Nutrition Goals Re-Evaluation:   Nutrition Goals Discharge (Final Nutrition Goals Re-Evaluation):   Psychosocial: Target Goals: Acknowledge presence or absence of significant depression and/or stress, maximize coping skills, provide positive support system. Participant is able to verbalize types and ability to use techniques and skills needed for reducing stress and depression.  Initial Review & Psychosocial Screening:  Initial Psych Review & Screening - 06/14/21 1124       Initial Review   Current issues with Current Stress Concerns    Source of Stress Concerns Chronic Illness;Unable to perform yard/household activities;Unable to participate in former interests or hobbies    Comments Cynthis has ongoing issues with orthostatic hypotension and has not been able to do alot around the house      FaWesternYes   CyMerrianneas her husband and her two children for support     Barriers   Psychosocial barriers to participate in program The patient should benefit from training in stress  management and relaxation.      Screening Interventions   Interventions Encouraged to exercise    Expected Outcomes Long Term Goal: Stressors or current issues are controlled or eliminated.;Long Term goal: The participant improves quality of Life and PHQ9 Scores as seen by post scores and/or verbalization of changes             Quality of Life Scores:  Quality of Life - 06/14/21 1104       Quality of Life   Select Quality of Life       Quality of Life Scores   Health/Function Pre 28.67 %    Socioeconomic Pre 29.58 %    Psych/Spiritual Pre 30 %    Family Pre 30 %    GLOBAL Pre 29.32 %            Scores of 19 and below usually indicate a poorer quality of life in these areas.  A difference of  2-3 points is a clinically meaningful difference.  A difference of 2-3 points in the total score of the Quality of Life Index has been associated with significant improvement in overall quality of life, self-image, physical symptoms, and general health in studies assessing change in quality of life.  PHQ-9: Recent Review Flowsheet Data     Depression screen [facility] 2/9 06/14/2021   Decreased Interest 1   Down, Depressed, Hopeless 0   PHQ - 2 Score 1   Altered sleeping 0   Tired, decreased energy 3   Change in appetite 0   Feeling bad or failure about yourself  0   Trouble concentrating 1   Moving slowly or fidgety/restless 0   Suicidal thoughts 0   PHQ-9 Score 5   Difficult doing work/chores Not difficult at all      Interpretation of Total Score  Total Score Depression Severity:  1-4 = Minimal depression, 5-9 = Mild depression, 10-14 = Moderate depression, 15-19 = Moderately severe depression, 20-27 = Severe depression   Psychosocial Evaluation and Intervention:   Psychosocial Re-Evaluation:  Psychosocial Re-Evaluation     [place] Name 06/30/21 1318 07/19/21 1609           Psychosocial Re-Evaluation   Current issues with Current Stress Concerns Current Stress Concerns      Comments Jilliann started exercise at [facility] on 06/29/21 no concerns were voiced Eleny talks about her orthostaic hypotension otherwise no concerns have been voiced      Expected Outcomes Pearla will have controlled or decreased stress upon completion of phase 2 cardiac rehab. Matelyn will have controlled or decreased stress upon completion of phase 2 cardiac rehab.      Interventions Encouraged to attend Cardiac Rehabilitation for the  exercise Encouraged to attend Cardiac Rehabilitation for the exercise      Continue Psychosocial Services  Follow up required by staff Follow up required by staff        Initial Review   Source of Stress Concerns Chronic Illness;Unable to perform yard/household activities;Unable to participate in former interests or hobbies Chronic Illness;Unable to perform yard/household activities;Unable to participate in former interests or hobbies      Comments Will continue to monitor and offer support as needed. Will continue to monitor and offer support as needed.               Psychosocial Discharge (Final Psychosocial Re-Evaluation):  Psychosocial Re-Evaluation - 07/19/21 1609       Psychosocial Re-Evaluation   Current issues with Current Stress Concerns  Comments Donya talks about her orthostaic hypotension otherwise no concerns have been voiced    Expected Outcomes Yani will have controlled or decreased stress upon completion of phase 2 cardiac rehab.    Interventions Encouraged to attend Cardiac Rehabilitation for the exercise    Continue Psychosocial Services  Follow up required by staff      Initial Review   Source of Stress Concerns Chronic Illness;Unable to perform yard/household activities;Unable to participate in former interests or hobbies    Comments Will continue to monitor and offer support as needed.             Vocational Rehabilitation: Provide vocational rehab assistance to qualifying candidates.   Vocational Rehab Evaluation & Intervention:  Vocational Rehab - 06/14/21 1126       Initial Vocational Rehab Evaluation & Intervention   Assessment shows need for Vocational Rehabilitation No   Cynthis is retired and does not need vocational rehab at this time            Education: Education Goals: Education classes will be provided on a weekly basis, covering required topics. Participant will state understanding/return demonstration of topics  presented.  Learning Barriers/Preferences:  Learning Barriers/Preferences - 06/14/21 1157       Learning Barriers/Preferences   Learning Barriers Sight   Memory deficits, dizziness   Learning Preferences Audio;Written Material;Computer/Internet;Group Instruction;Individual Instruction;Pictoral;Skilled Demonstration;Verbal Instruction;Video             Education Topics: Hypertension, Hypertension Reduction -Define heart disease and high blood pressure. Discus how high blood pressure affects the body and ways to reduce high blood pressure.   Exercise and Your Heart -Discuss why it is important to exercise, the FITT principles of exercise, normal and abnormal responses to exercise, and how to exercise safely.   Angina -Discuss definition of angina, causes of angina, treatment of angina, and how to decrease risk of having angina.   Cardiac Medications -Review what the following cardiac medications are used for, how they affect the body, and side effects that may occur when taking the medications.  Medications include Aspirin, Beta blockers, calcium channel blockers, ACE Inhibitors, angiotensin receptor blockers, diuretics, digoxin, and antihyperlipidemics.   Congestive Heart Failure -Discuss the definition of CHF, how to live with CHF, the signs and symptoms of CHF, and how keep track of weight and sodium intake.   Heart Disease and Intimacy -Discus the effect sexual activity has on the heart, how changes occur during intimacy as we age, and safety during sexual activity.   Smoking Cessation / COPD -Discuss different methods to quit smoking, the health benefits of quitting smoking, and the definition of COPD.   Nutrition I: Fats -Discuss the types of cholesterol, what cholesterol does to the heart, and how cholesterol levels can be controlled.   Nutrition II: Labels -Discuss the different components of food labels and how to read food label   Heart Parts/Heart Disease  and PAD -Discuss the anatomy of the heart, the pathway of blood circulation through the heart, and these are affected by heart disease.   Stress I: Signs and Symptoms -Discuss the causes of stress, how stress may lead to anxiety and depression, and ways to limit stress.   Stress II: Relaxation -Discuss different types of relaxation techniques to limit stress.   Warning Signs of Stroke / TIA -Discuss definition of a stroke, what the signs and symptoms are of a stroke, and how to identify when someone is having stroke.   Knowledge Questionnaire Score:  Knowledge Questionnaire  Score - 06/14/21 1104       Knowledge Questionnaire Score   Pre Score 21/24             Core Components/Risk Factors/Patient Goals at Admission:  Personal Goals and Risk Factors at Admission - 06/14/21 1105       Core Components/Risk Factors/Patient Goals on Admission    Weight Management Weight Maintenance    Lipids Yes    Intervention Provide education and support for participant on nutrition & aerobic/resistive exercise along with prescribed medications to achieve LDL <57m, HDL >411m    Expected Outcomes Short Term: Participant states understanding of desired cholesterol values and is compliant with medications prescribed. Participant is following exercise prescription and nutrition guidelines.;Long Term: Cholesterol controlled with medications as prescribed, with individualized exercise RX and with personalized nutrition plan. Value goals: LDL < 7063mHDL > 40 mg.             Core Components/Risk Factors/Patient Goals Review:   Goals and Risk Factor Review     Row Name 06/30/21 1358 07/19/21 1613           Core Components/Risk Factors/Patient Goals Review   Personal Goals Review Weight Management/Obesity;Lipids Weight Management/Obesity;Lipids      Review CynEulandaarted exercise at cardiac rehab on 06/29/21. CynBertinad well with exercise for her fitness level as CynKadian  deconditioned. CynAleesiaes well with exercise at cardiac rehab when in attendance. Lene's vital signs have been stable. CynMaceles chair exercises to decrease her chances of feeling light headed with exercise.      Expected Outcomes CynDemyahll continue to participate in phase 2 cardiac rehab for exercise, nutrition and life style modifications. CynElajahll continue to participate in phase 2 cardiac rehab for exercise, nutrition and life style modifications.               Core Components/Risk Factors/Patient Goals at Discharge (Final Review):   Goals and Risk Factor Review - 07/19/21 1613       Core Components/Risk Factors/Patient Goals Review   Personal Goals Review Weight Management/Obesity;Lipids    Review CynSharranes well with exercise at cardiac rehab when in attendance. Epifania's vital signs have been stable. CynJaynees chair exercises to decrease her chances of feeling light headed with exercise.    Expected Outcomes CynTaceyll continue to participate in phase 2 cardiac rehab for exercise, nutrition and life style modifications.             ITP Comments:  ITP Comments     Row Name 06/14/21 1123 06/30/21 1317 07/19/21 1607       ITP Comments Dr TraFransico Him, Medical Director 30 Day ITP Review. CynAnaisarted cardiac rehab on 06/29/21 and did well with exercise for her fitness level 30 Day ITP Review. CynMyrls good participation when in attendance. Instructed patient to make sure she feels well when coming to exercise.              Comments: See ITP comments.MarHarrell Gave BSN

## 2021-07-20 ENCOUNTER — Encounter (HOSPITAL_COMMUNITY): Payer: Medicare Other

## 2021-07-20 ENCOUNTER — Telehealth (HOSPITAL_COMMUNITY): Payer: Self-pay | Admitting: Family Medicine

## 2021-07-20 NOTE — Progress Notes (Signed)
Remote pacemaker transmission.   

## 2021-07-22 ENCOUNTER — Encounter (HOSPITAL_COMMUNITY)
Admission: RE | Admit: 2021-07-22 | Discharge: 2021-07-22 | Disposition: A | Discharge status: Home / Self Care | Payer: Medicare Other | Source: Ambulatory Visit | Attending: Cardiology | Admitting: Cardiology

## 2021-07-22 ENCOUNTER — Other Ambulatory Visit: Payer: Self-pay

## 2021-07-22 DIAGNOSIS — I214 Non-ST elevation (NSTEMI) myocardial infarction: Secondary | ICD-10-CM | POA: Diagnosis not present

## 2021-07-22 DIAGNOSIS — Z955 Presence of coronary angioplasty implant and graft: Secondary | ICD-10-CM | POA: Diagnosis not present

## 2021-07-22 DIAGNOSIS — Z87891 Personal history of nicotine dependence: Secondary | ICD-10-CM | POA: Diagnosis not present

## 2021-07-25 ENCOUNTER — Other Ambulatory Visit: Payer: Self-pay

## 2021-07-25 ENCOUNTER — Encounter (HOSPITAL_COMMUNITY)
Admission: RE | Admit: 2021-07-25 | Discharge: 2021-07-25 | Disposition: A | Discharge status: Home / Self Care | Payer: Medicare Other | Source: Ambulatory Visit | Attending: Cardiology | Admitting: Cardiology

## 2021-07-25 DIAGNOSIS — Z955 Presence of coronary angioplasty implant and graft: Secondary | ICD-10-CM

## 2021-07-25 DIAGNOSIS — I214 Non-ST elevation (NSTEMI) myocardial infarction: Secondary | ICD-10-CM

## 2021-07-25 DIAGNOSIS — Z87891 Personal history of nicotine dependence: Secondary | ICD-10-CM | POA: Diagnosis not present

## 2021-07-27 ENCOUNTER — Encounter (HOSPITAL_COMMUNITY)
Admission: RE | Admit: 2021-07-27 | Discharge: 2021-07-27 | Disposition: A | Discharge status: Home / Self Care | Payer: Medicare Other | Source: Ambulatory Visit | Attending: Cardiology | Admitting: Cardiology

## 2021-07-27 ENCOUNTER — Other Ambulatory Visit: Payer: Self-pay

## 2021-07-27 DIAGNOSIS — I214 Non-ST elevation (NSTEMI) myocardial infarction: Secondary | ICD-10-CM | POA: Diagnosis not present

## 2021-07-27 DIAGNOSIS — Z955 Presence of coronary angioplasty implant and graft: Secondary | ICD-10-CM | POA: Diagnosis not present

## 2021-07-27 DIAGNOSIS — Z87891 Personal history of nicotine dependence: Secondary | ICD-10-CM | POA: Diagnosis not present

## 2021-07-28 NOTE — Progress Notes (Signed)
Reviewed home exercise Rx with patient. Encouraged warm-up, cool-down and stretching. Reviewed THRR of 59-118 and keeping RPE between 11-13. Stressed hydration before, during and after exercise. Stressed to patient that she should not exercise if she is feeling lightheaded as she has a hx of orthostasis. Reviewed weather parameters for temperature and humidity for when pt walks outdoors. Reviewed S/S to terminate exercise and when to call 911 vs MD. Reviewed use of NTG. Pt verbalized understanding of the home exercise Rx. Pt provided a copy.   Lesly Rubenstein MS, ACSM-CEP, CCRP

## 2021-07-29 ENCOUNTER — Other Ambulatory Visit: Payer: Self-pay

## 2021-07-29 ENCOUNTER — Encounter (HOSPITAL_COMMUNITY)
Admission: RE | Admit: 2021-07-29 | Discharge: 2021-07-29 | Disposition: A | Discharge status: Home / Self Care | Payer: Medicare Other | Source: Ambulatory Visit | Attending: Cardiology | Admitting: Cardiology

## 2021-07-29 DIAGNOSIS — Z955 Presence of coronary angioplasty implant and graft: Secondary | ICD-10-CM

## 2021-07-29 DIAGNOSIS — I214 Non-ST elevation (NSTEMI) myocardial infarction: Secondary | ICD-10-CM | POA: Diagnosis not present

## 2021-07-29 DIAGNOSIS — Z87891 Personal history of nicotine dependence: Secondary | ICD-10-CM | POA: Diagnosis not present

## 2021-08-01 ENCOUNTER — Telehealth (HOSPITAL_COMMUNITY): Payer: Self-pay | Admitting: Family Medicine

## 2021-08-01 ENCOUNTER — Encounter (HOSPITAL_COMMUNITY): Payer: Medicare Other

## 2021-08-03 ENCOUNTER — Encounter (HOSPITAL_COMMUNITY)
Admission: RE | Admit: 2021-08-03 | Discharge: 2021-08-03 | Disposition: A | Discharge status: Home / Self Care | Payer: Medicare Other | Source: Ambulatory Visit | Attending: Cardiology | Admitting: Cardiology

## 2021-08-03 ENCOUNTER — Other Ambulatory Visit: Payer: Self-pay

## 2021-08-03 DIAGNOSIS — I214 Non-ST elevation (NSTEMI) myocardial infarction: Secondary | ICD-10-CM | POA: Diagnosis not present

## 2021-08-03 DIAGNOSIS — Z955 Presence of coronary angioplasty implant and graft: Secondary | ICD-10-CM | POA: Diagnosis not present

## 2021-08-05 ENCOUNTER — Telehealth (HOSPITAL_COMMUNITY): Payer: Self-pay | Admitting: Family Medicine

## 2021-08-05 ENCOUNTER — Encounter (HOSPITAL_COMMUNITY): Payer: Medicare Other

## 2021-08-08 ENCOUNTER — Encounter (HOSPITAL_COMMUNITY): Payer: Medicare Other

## 2021-08-08 ENCOUNTER — Telehealth (HOSPITAL_COMMUNITY): Payer: Self-pay | Admitting: *Deleted

## 2021-08-08 NOTE — Telephone Encounter (Signed)
Patient's husband, Alayzha An called to say that Beverely was very lightheaded today. She's currently asleep, and he doesn't plan to wake her. She will be absent from cardiac rehab today but plans to return on Wednesday, 08/10/2021.

## 2021-08-10 ENCOUNTER — Encounter (HOSPITAL_COMMUNITY)
Admission: RE | Admit: 2021-08-10 | Discharge: 2021-08-10 | Disposition: A | Discharge status: Home / Self Care | Payer: Medicare Other | Source: Ambulatory Visit | Attending: Cardiology | Admitting: Cardiology

## 2021-08-10 ENCOUNTER — Other Ambulatory Visit: Payer: Self-pay

## 2021-08-10 VITALS — Ht 67.5 in | Wt 178.6 lb

## 2021-08-10 DIAGNOSIS — Z955 Presence of coronary angioplasty implant and graft: Secondary | ICD-10-CM | POA: Diagnosis not present

## 2021-08-10 DIAGNOSIS — I214 Non-ST elevation (NSTEMI) myocardial infarction: Secondary | ICD-10-CM

## 2021-08-12 ENCOUNTER — Encounter (HOSPITAL_COMMUNITY)
Admission: RE | Admit: 2021-08-12 | Discharge: 2021-08-12 | Disposition: A | Discharge status: Home / Self Care | Payer: Medicare Other | Source: Ambulatory Visit | Attending: Cardiology | Admitting: Cardiology

## 2021-08-12 ENCOUNTER — Other Ambulatory Visit: Payer: Self-pay

## 2021-08-12 DIAGNOSIS — Z955 Presence of coronary angioplasty implant and graft: Secondary | ICD-10-CM | POA: Diagnosis not present

## 2021-08-12 DIAGNOSIS — I214 Non-ST elevation (NSTEMI) myocardial infarction: Secondary | ICD-10-CM

## 2021-08-15 ENCOUNTER — Encounter (HOSPITAL_COMMUNITY)
Admission: RE | Admit: 2021-08-15 | Discharge: 2021-08-15 | Disposition: A | Discharge status: Home / Self Care | Payer: Medicare Other | Source: Ambulatory Visit | Attending: Cardiology | Admitting: Cardiology

## 2021-08-15 ENCOUNTER — Other Ambulatory Visit: Payer: Self-pay

## 2021-08-15 DIAGNOSIS — Z955 Presence of coronary angioplasty implant and graft: Secondary | ICD-10-CM

## 2021-08-15 DIAGNOSIS — I214 Non-ST elevation (NSTEMI) myocardial infarction: Secondary | ICD-10-CM | POA: Diagnosis not present

## 2021-08-17 ENCOUNTER — Ambulatory Visit: Payer: Medicare Other | Admitting: Neurology

## 2021-08-17 ENCOUNTER — Encounter (HOSPITAL_COMMUNITY)
Admission: RE | Admit: 2021-08-17 | Discharge: 2021-08-17 | Disposition: A | Discharge status: Home / Self Care | Payer: Medicare Other | Source: Ambulatory Visit | Attending: Cardiology | Admitting: Cardiology

## 2021-08-17 ENCOUNTER — Other Ambulatory Visit: Payer: Self-pay

## 2021-08-17 ENCOUNTER — Telehealth: Payer: Self-pay | Admitting: Neurology

## 2021-08-17 DIAGNOSIS — Z955 Presence of coronary angioplasty implant and graft: Secondary | ICD-10-CM | POA: Diagnosis not present

## 2021-08-17 DIAGNOSIS — I214 Non-ST elevation (NSTEMI) myocardial infarction: Secondary | ICD-10-CM | POA: Diagnosis not present

## 2021-08-17 NOTE — Progress Notes (Signed)
Cardiac Individual Treatment Plan  Patient Details  Name: Rachel Clark MRN: [id number] Date of Birth: 12-Oct-1950 Referring Provider:   Flowsheet Row CARDIAC REHAB PHASE II ORIENTATION from 06/21/2021 in [facility]  Referring Provider Peter [name], MD       Initial Encounter Date:  [place] PHASE II ORIENTATION from 06/21/2021 in [facility]  Date 06/21/21       Visit Diagnosis: 03/08/21 NSTEMI   03/08/21 S/P DES x 3 RAMUS  Patient's Home Medications on Admission:  Current Outpatient Medications:    acetaminophen (TYLENOL) 500 MG tablet, Take 500-1,000 mg by mouth daily as needed for mild pain or headache., Disp: , Rfl:    aspirin 81 MG chewable tablet, Chew 1 tablet (81 mg total) by mouth daily., Disp: , Rfl:    Azelastine HCl 0.15 % SOLN, Place 1 spray into both nostrils daily as needed (seasonal allergies)., Disp: , Rfl: 12   Bempedoic Acid-Ezetimibe (NEXLIZET) 180-10 MG TABS, Take 180 mg by mouth daily., Disp: 90 tablet, Rfl: 3   calcium carbonate (TUMS - DOSED IN MG ELEMENTAL CALCIUM) 500 MG chewable tablet, Chew 500 mg by mouth daily., Disp: , Rfl:    carbamazepine (TEGRETOL XR) 200 MG 12 hr tablet, Take 3 tablets (600 mg total) by mouth 2 (two) times daily., Disp: 180 tablet, Rfl: 11   Cholecalciferol (VITAMIN D) 50 MCG (2000 UT) CAPS, Take 2,000 Units by mouth daily., Disp: , Rfl:    DULoxetine (CYMBALTA) 60 MG capsule, Take 60 mg by mouth 2 (two) times daily., Disp: , Rfl:    ethosuximide (ZARONTIN) 250 MG capsule, TAKE 2 CAPSULES IN THE MORNING, TAKE 2 CAPSULES AT NIGHT, Disp: 360 capsule, Rfl: 3   levothyroxine (SYNTHROID, LEVOTHROID) 50 MCG tablet, Take 50 mcg by mouth daily before breakfast., Disp: , Rfl:    memantine (NAMENDA) 10 MG tablet, TAKE 1 TABLET BY MOUTH TWICE A DAY, Disp: 180 tablet, Rfl: 1   nitroGLYCERIN (NITROSTAT) 0.4 MG SL tablet, Place 1 tablet (0.4 mg total) under the  tongue every 5 (five) minutes as needed. For chest pain., Disp: 25 tablet, Rfl: 11   pantoprazole (PROTONIX) 40 MG tablet, Take 1 tablet (40 mg total) by mouth 2 (two) times daily. AFTER 2 MONTHS CONT AS ONCE A DAY (Patient taking differently: Take 40 mg by mouth daily.), Disp: 60 tablet, Rfl: 1   prasugrel (EFFIENT) 10 MG TABS tablet, Take 1 tablet (10 mg total) by mouth daily., Disp: 90 tablet, Rfl: 3   tiZANidine (ZANAFLEX) 2 MG tablet, Take 2 mg by mouth every 6 (six) hours as needed for muscle spasms., Disp: , Rfl:   Past Medical History: Past Medical History:  Diagnosis Date   Arthritis    "fingers" (06/29/2016)   Chronic lower back pain    Colon cancer ([place]) 12/04/2011   s/p Laparoscopic-assisted transverse colectomy on 12/19/2011 by Dr. Donne Hazel.  pT3 N0 M0.    Coronary artery disease    a. remote NSTEMI with PCI to the RCA; b. S/P CABG in 2007; c. 2010 - 2 v CAD with L-LAD and S-RCA patent, small caliber Dx 70% (treated medically - no amenable to PCI), CFX free of significant disease, normal LVF. d. 10/2014 Lexi MV: EF 61%, no ischemia/infarct.   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  last seizure was in 1972 ;takes Tegretol (06/29/2016)    Headache    "weekly" (06/29/2016)   Hyperlipidemia    Hypothyroid    Iron deficiency anemia 11/17/2011   Memory change 11/13/2016   Monoclonal gammopathy of unknown significance    Orthostatic hypotension 12/21/2020   Presence of permanent cardiac pacemaker    Syncope and collapse    pacemaker implanted    Tobacco Use: Social History   Tobacco Use  Smoking Status Former   Packs/day: 0.30   Years: 20.00   Pack years: 6.00   Types: Cigarettes   Quit date: 06/06/1981   Years since quitting: 40.2  Smokeless Tobacco Never    Labs: Recent Review Flowsheet Data     Labs for ITP Cardiac and Pulmonary Rehab Latest  Ref Rng & Units 12/17/2020 01/19/2021 03/08/2021 03/09/2021 03/13/2021   Cholestrol 0 - 200 mg/dL 261(H) 176 - 157 -   LDLCALC 0 - 99 mg/dL 151(H) 52 - 72 -   HDL >40 mg/dL 86 82 - 68 -   Trlycerides <150 mg/dL 141 276(H) - 86 -   Hemoglobin A1c 4.8 - 5.6 % - - - - -   PHART 7.350 - 7.450 - - 7.213(L) - -   PCO2ART 32.0 - 48.0 mmHg - - 46.1 - -   HCO3 20.0 - 28.0 mmol/L - - 18.5(L) - -   TCO2 22 - 32 mmol/L - - 20(L) - 24   ACIDBASEDEF 0.0 - 2.0 mmol/L - - 9.0(H) - -   O2SAT % - - 97.0 - -       Capillary Blood Glucose: Lab Results  Component Value Date   GLUCAP 140 (H) 07/27/2015     Exercise Target Goals: Exercise Program Goal: Individual exercise prescription set using results from initial 6 min walk test and THRR while considering  patients activity barriers and safety.   Exercise Prescription Goal: Starting with aerobic activity 30 plus minutes a day, 3 days per week for initial exercise prescription. Provide home exercise prescription and guidelines that participant acknowledges understanding prior to discharge.  Activity Barriers & Risk Stratification:  Activity Barriers & Cardiac Risk Stratification - 06/14/21 1111       Activity Barriers & Cardiac Risk Stratification   Activity Barriers Fibromyalgia;Deconditioning;Muscular Weakness;Balance Concerns;History of Falls;Other (comment)    Comments Chronic Fatiigue, lighthededness/dizziness/orthostasis    Cardiac Risk Stratification High             6 Minute Walk:  6 Minute Walk     Row Name 06/21/21 1150 08/10/21 1308       6 Minute Walk   Phase Initial Discharge    Distance 1064 feet 1139 feet    Distance % Change -- 7.05 %    Distance Feet Change -- 75 ft    Walk Time 6 minutes 6 minutes    # of Rest Breaks 0 0    MPH 2.02 2.16    METS 2.72 3    RPE 9 9    Perceived Dyspnea  0 0    VO2 Peak 9.52 10.51    Symptoms No Yes (comment)    Comments -- some lightheadedness after sitting    Resting HR 96 bpm 91  bpm    Resting BP 144/80 124/70    Resting Oxygen Saturation  99 % 99 %    Exercise Oxygen Saturation  during 6 min walk 99 % 99 %    Max Ex. HR 108 bpm 126 bpm  Max Ex. BP 158/82 156/74    2 Minute Post BP 138/80 --             Oxygen Initial Assessment:   Oxygen Re-Evaluation:   Oxygen Discharge (Final Oxygen Re-Evaluation):   Initial Exercise Prescription:  Initial Exercise Prescription - 06/21/21 1300       Date of Initial Exercise RX and Referring Provider   Date 06/21/21    Referring Provider Peter [name], MD    Expected Discharge Date 08/12/21      NuStep   Level 1    SPM 75    Minutes 25    METs 2      Prescription Details   Frequency (times per week) 3    Duration Progress to 30 minutes of continuous aerobic without signs/symptoms of physical distress      Intensity   THRR 40-80% of Max Heartrate 60-119    Ratings of Perceived Exertion 11-13    Perceived Dyspnea 0-4      Progression   Progression Continue progressive overload as per policy without signs/symptoms or physical distress.      Resistance Training   Training Prescription Yes    Weight 2 lb    Reps 10-15             Perform Capillary Blood Glucose checks as needed.  Exercise Prescription Changes:   Exercise Prescription Changes     Row Name 06/29/21 1600 07/27/21 1400           Response to Exercise   Blood Pressure (Admit) 138/82 120/58      Blood Pressure (Exercise) 138/70 128/60      Blood Pressure (Exit) 108/74 104/58      Heart Rate (Admit) 95 bpm 98 bpm      Heart Rate (Exercise) 115 bpm 114 bpm      Heart Rate (Exit) 87 bpm 91 bpm      Rating of Perceived Exertion (Exercise) 10 11      Symptoms Lightheadedness after the nustep upon standing Lightheadedness after the nustep upon standing      Comments Pt's first day in the CRP2 program Reviewed goals and home exercise Rx      Duration Progress to 30 minutes of  aerobic without signs/symptoms of physical  distress Continue with 30 min of aerobic exercise without signs/symptoms of physical distress.      Intensity THRR unchanged THRR unchanged        Progression   Progression Continue to progress workloads to maintain intensity without signs/symptoms of physical distress. Continue to progress workloads to maintain intensity without signs/symptoms of physical distress.      Average METs 2.2 2.3        Resistance Training   Training Prescription No No      Weight No weights on Wednesdays No weights on Wednesdays        Interval Training   Interval Training No No        NuStep   Level 1 1      SPM 80 80      Minutes 25 30      METs 2.2 2.3        Home Exercise Plan   Plans to continue exercise at -- Home (comment)      Frequency -- Add 2 additional days to program exercise sessions.      Initial Home Exercises Provided -- 07/27/21  Exercise Comments:   Exercise Comments     Row Name 06/14/21 1116 06/21/21 1402 06/29/21 1628 07/18/21 1637 07/27/21 1436   Exercise Comments Unable to proceed with 6-minute walk test due to symptomatic orthostatic BP/lightheadedness Pt returned today to complete 6-minute walk test. Pt was able to complete the walk test today with the aid of a rollator and no rest breaks. Tolerated well. Pt's first day in the CRP2 program. Pt has her ongoing lightheadedness which she experienced after standing up from the nustep. Pt voices that it improved with walking to chair. Pt was assisted by staff for safety. Will continue to montior patient's hemodynamic reponse to the exercise bout as well as symptoms of lightheadedness. Pt's attendance has been sporadic due to her ongoing issues with lightheadedness. Will address MET level once pt can establish consistant attendance. Reviewed goals and home exercise Rx. Pt voices desire to use her stationary bike at home. Pt instructed to not get on the bike if she has been feeling lightheaded and only with the  assistance of her spouse. Pt also will continue her walks with the dog. Pt verbalized understanding of the home exercise Rx and will be provided a copy.            Exercise Goals and Review:   Exercise Goals     Row Name 06/14/21 1113             Exercise Goals   Increase Physical Activity Yes       Intervention Provide advice, education, support and counseling about physical activity/exercise needs.;Develop an individualized exercise prescription for aerobic and resistive training based on initial evaluation findings, risk stratification, comorbidities and participant's personal goals.       Expected Outcomes Short Term: Attend rehab on a regular basis to increase amount of physical activity.;Long Term: Add in home exercise to make exercise part of routine and to increase amount of physical activity.;Long Term: Exercising regularly at least 3-5 days a week.       Increase Strength and Stamina Yes       Intervention Provide advice, education, support and counseling about physical activity/exercise needs.;Develop an individualized exercise prescription for aerobic and resistive training based on initial evaluation findings, risk stratification, comorbidities and participant's personal goals.       Expected Outcomes Short Term: Increase workloads from initial exercise prescription for resistance, speed, and METs.;Short Term: Perform resistance training exercises routinely during rehab and add in resistance training at home;Long Term: Improve cardiorespiratory fitness, muscular endurance and strength as measured by increased METs and functional capacity (6MWT)       Able to understand and use rate of perceived exertion (RPE) scale Yes       Intervention Provide education and explanation on how to use RPE scale       Expected Outcomes Short Term: Able to use RPE daily in rehab to express subjective intensity level;Long Term:  Able to use RPE to guide intensity level when exercising  independently       Knowledge and understanding of Target Heart Rate Range (THRR) Yes       Intervention Provide education and explanation of THRR including how the numbers were predicted and where they are located for reference       Expected Outcomes Short Term: Able to state/look up THRR;Long Term: Able to use THRR to govern intensity when exercising independently;Short Term: Able to use daily as guideline for intensity in rehab       Understanding of Exercise  Prescription Yes       Intervention Provide education, explanation, and written materials on patient's individual exercise prescription       Expected Outcomes Short Term: Able to explain program exercise prescription;Long Term: Able to explain home exercise prescription to exercise independently                Exercise Goals Re-Evaluation :  Exercise Goals Re-Evaluation     Row Name 06/29/21 1625 07/27/21 1429           Exercise Goal Re-Evaluation   Exercise Goals Review Increase Physical Activity;Increase Strength and Stamina;Able to understand and use rate of perceived exertion (RPE) scale;Knowledge and understanding of Target Heart Rate Range (THRR);Understanding of Exercise Prescription Increase Physical Activity;Increase Strength and Stamina;Able to understand and use rate of perceived exertion (RPE) scale;Knowledge and understanding of Target Heart Rate Range (THRR);Understanding of Exercise Prescription      Comments Pt's first day in the CRP2 program. Pt understands the exercise Rx, THRR and RPE scale. Reviewed goals and home exercise Rx. Pt voices some progress to goals. Pt voices she feeling better, stronger and  having less lightheadedness. Pt walking at home more with the dog and wants to use her stationary bike.      Expected Outcomes Will continue to monitor patient and porgress exercise workloads as tolerated. Pt will exercise at home walking and using her stationary bike.                Discharge Exercise  Prescription (Final Exercise Prescription Changes):  Exercise Prescription Changes - 07/27/21 1400       Response to Exercise   Blood Pressure (Admit) 120/58    Blood Pressure (Exercise) 128/60    Blood Pressure (Exit) 104/58    Heart Rate (Admit) 98 bpm    Heart Rate (Exercise) 114 bpm    Heart Rate (Exit) 91 bpm    Rating of Perceived Exertion (Exercise) 11    Symptoms Lightheadedness after the nustep upon standing    Comments Reviewed goals and home exercise Rx    Duration Continue with 30 min of aerobic exercise without signs/symptoms of physical distress.    Intensity THRR unchanged      Progression   Progression Continue to progress workloads to maintain intensity without signs/symptoms of physical distress.    Average METs 2.3      Resistance Training   Training Prescription No    Weight No weights on Wednesdays      Interval Training   Interval Training No      NuStep   Level 1    SPM 80    Minutes 30    METs 2.3      Home Exercise Plan   Plans to continue exercise at Home (comment)    Frequency Add 2 additional days to program exercise sessions.    Initial Home Exercises Provided 07/27/21             Nutrition:  Target Goals: Understanding of nutrition guidelines, daily intake of sodium <1525m, cholesterol <207m calories 30% from fat and 7% or less from saturated fats, daily to have 5 or more servings of fruits and vegetables.  Biometrics:  Pre Biometrics - 06/14/21 1113       Pre Biometrics   Waist Circumference 41.5 inches    Hip Circumference 45 inches    Waist to Hip Ratio 0.92 %    Triceps Skinfold 20 mm    % Body Fat 39.1 %  Grip Strength 17 kg    Flexibility 14.5 in    Single Leg Stand 2.12 seconds             Post Biometrics - 08/10/21 1410        Post  Biometrics   Height 5' 7.5" (1.715 m)    Weight 81 kg    Waist Circumference 41.5 inches    Hip Circumference 45 inches    Waist to Hip Ratio 0.92 %    BMI (Calculated)  27.54    Triceps Skinfold 20 mm    % Body Fat 39.3 %    Grip Strength 23 kg    Flexibility 15.5 in    Single Leg Stand 2 seconds             Nutrition Therapy Plan and Nutrition Goals:   Nutrition Assessments:  MEDIFICTS Score Key: ?70 Need to make dietary changes  40-70 Heart Healthy Diet ? 40 Therapeutic Level Cholesterol Diet   Picture Your Plate Scores: <76 Unhealthy dietary pattern with much room for improvement. 41-50 Dietary pattern unlikely to meet recommendations for good health and room for improvement. 51-60 More healthful dietary pattern, with some room for improvement.  >60 Healthy dietary pattern, although there may be some specific behaviors that could be improved.    Nutrition Goals Re-Evaluation:   Nutrition Goals Discharge (Final Nutrition Goals Re-Evaluation):   Psychosocial: Target Goals: Acknowledge presence or absence of significant depression and/or stress, maximize coping skills, provide positive support system. Participant is able to verbalize types and ability to use techniques and skills needed for reducing stress and depression.  Initial Review & Psychosocial Screening:  Initial Psych Review & Screening - 06/14/21 1124       Initial Review   Current issues with Current Stress Concerns    Source of Stress Concerns Chronic Illness;Unable to perform yard/household activities;Unable to participate in former interests or hobbies    Comments Cynthis has ongoing issues with orthostatic hypotension and has not been able to do alot around the house      [place]? Yes   Corynne has her husband and her two children for support     Barriers   Psychosocial barriers to participate in program The patient should benefit from training in stress management and relaxation.      Screening Interventions   Interventions Encouraged to exercise    Expected Outcomes Long Term Goal: Stressors or current issues are controlled or  eliminated.;Long Term goal: The participant improves quality of Life and PHQ9 Scores as seen by post scores and/or verbalization of changes             Quality of Life Scores:  Quality of Life - 08/12/21 1600       Quality of Life   Select Quality of Life      Quality of Life Scores   Health/Function Pre 28.67 %    Health/Function Post 26.1 %    Health/Function % Change -8.96 %    Socioeconomic Pre 29.58 %    Socioeconomic Post 26.29 %    Socioeconomic % Change  -11.12 %    Psych/Spiritual Pre 30 %    Psych/Spiritual Post 24 %    Psych/Spiritual % Change -20 %    Family Pre 30 %    Family Post 28.8 %    Family % Change -4 %    GLOBAL Pre 29.32 %    GLOBAL Post 26.1 %  GLOBAL % Change -10.98 %            Scores of 19 and below usually indicate a poorer quality of life in these areas.  A difference of  2-3 points is a clinically meaningful difference.  A difference of 2-3 points in the total score of the Quality of Life Index has been associated with significant improvement in overall quality of life, self-image, physical symptoms, and general health in studies assessing change in quality of life.  PHQ-9: Recent Review Flowsheet Data     Depression screen [facility] 2/9 06/14/2021   Decreased Interest 1   Down, Depressed, Hopeless 0   PHQ - 2 Score 1   Altered sleeping 0   Tired, decreased energy 3   Change in appetite 0   Feeling bad or failure about yourself  0   Trouble concentrating 1   Moving slowly or fidgety/restless 0   Suicidal thoughts 0   PHQ-9 Score 5   Difficult doing work/chores Not difficult at all      Interpretation of Total Score  Total Score Depression Severity:  1-4 = Minimal depression, 5-9 = Mild depression, 10-14 = Moderate depression, 15-19 = Moderately severe depression, 20-27 = Severe depression   Psychosocial Evaluation and Intervention:   Psychosocial Re-Evaluation:  Psychosocial Re-Evaluation     [place] Name 06/30/21 1318 07/19/21  1609 08/17/21 1438         Psychosocial Re-Evaluation   Current issues with Current Stress Concerns Current Stress Concerns Current Stress Concerns     Comments Adriel started exercise at [facility] on 06/29/21 no concerns were voiced Kemara talks about her orthostaic hypotension otherwise no concerns have been voiced Kiona talks about her orthostaic hypotension otherwise no concerns have been voiced     Expected Outcomes Emmani will have controlled or decreased stress upon completion of phase 2 cardiac rehab. Anarie will have controlled or decreased stress upon completion of phase 2 cardiac rehab. Jacklin will have controlled or decreased stress upon completion of phase 2 cardiac rehab.     Interventions Encouraged to attend Cardiac Rehabilitation for the exercise Encouraged to attend Cardiac Rehabilitation for the exercise Encouraged to attend Cardiac Rehabilitation for the exercise     Continue Psychosocial Services  Follow up required by staff Follow up required by staff No Follow up required       Initial Review   Source of Stress Concerns Chronic Illness;Unable to perform yard/household activities;Unable to participate in former interests or hobbies Chronic Illness;Unable to perform yard/household activities;Unable to participate in former interests or hobbies Chronic Illness;Unable to perform yard/household activities;Unable to participate in former interests or hobbies     Comments Will continue to monitor and offer support as needed. Will continue to monitor and offer support as needed. Will continue to monitor and offer support as needed.              Psychosocial Discharge (Final Psychosocial Re-Evaluation):  Psychosocial Re-Evaluation - 08/17/21 1438       Psychosocial Re-Evaluation   Current issues with Current Stress Concerns    Comments Deetta talks about her orthostaic hypotension otherwise no concerns have been voiced    Expected Outcomes Anavey will have controlled or  decreased stress upon completion of phase 2 cardiac rehab.    Interventions Encouraged to attend Cardiac Rehabilitation for the exercise    Continue Psychosocial Services  No Follow up required      Initial Review   Source of Stress Concerns Chronic Illness;Unable to  perform yard/household activities;Unable to participate in former interests or hobbies    Comments Will continue to monitor and offer support as needed.             Vocational Rehabilitation: Provide vocational rehab assistance to qualifying candidates.   Vocational Rehab Evaluation & Intervention:  Vocational Rehab - 06/14/21 1126       Initial Vocational Rehab Evaluation & Intervention   Assessment shows need for Vocational Rehabilitation No   Cynthis is retired and does not need vocational rehab at this time            Education: Education Goals: Education classes will be provided on a weekly basis, covering required topics. Participant will state understanding/return demonstration of topics presented.  Learning Barriers/Preferences:  Learning Barriers/Preferences - 06/14/21 1157       Learning Barriers/Preferences   Learning Barriers Sight   Memory deficits, dizziness   Learning Preferences Audio;Written Material;Computer/Internet;Group Instruction;Individual Instruction;Pictoral;Skilled Demonstration;Verbal Instruction;Video             Education Topics: Hypertension, Hypertension Reduction -Define heart disease and high blood pressure. Discus how high blood pressure affects the body and ways to reduce high blood pressure.   Exercise and Your Heart -Discuss why it is important to exercise, the FITT principles of exercise, normal and abnormal responses to exercise, and how to exercise safely.   Angina -Discuss definition of angina, causes of angina, treatment of angina, and how to decrease risk of having angina.   Cardiac Medications -Review what the following cardiac medications are used  for, how they affect the body, and side effects that may occur when taking the medications.  Medications include Aspirin, Beta blockers, calcium channel blockers, ACE Inhibitors, angiotensin receptor blockers, diuretics, digoxin, and antihyperlipidemics.   Congestive Heart Failure -Discuss the definition of CHF, how to live with CHF, the signs and symptoms of CHF, and how keep track of weight and sodium intake.   Heart Disease and Intimacy -Discus the effect sexual activity has on the heart, how changes occur during intimacy as we age, and safety during sexual activity.   Smoking Cessation / COPD -Discuss different methods to quit smoking, the health benefits of quitting smoking, and the definition of COPD.   Nutrition I: Fats -Discuss the types of cholesterol, what cholesterol does to the heart, and how cholesterol levels can be controlled.   Nutrition II: Labels -Discuss the different components of food labels and how to read food label   Heart Parts/Heart Disease and PAD -Discuss the anatomy of the heart, the pathway of blood circulation through the heart, and these are affected by heart disease.   Stress I: Signs and Symptoms -Discuss the causes of stress, how stress may lead to anxiety and depression, and ways to limit stress.   Stress II: Relaxation -Discuss different types of relaxation techniques to limit stress.   Warning Signs of Stroke / TIA -Discuss definition of a stroke, what the signs and symptoms are of a stroke, and how to identify when someone is having stroke.   Knowledge Questionnaire Score:  Knowledge Questionnaire Score - 08/12/21 1600       Knowledge Questionnaire Score   Post Score 18/24             Core Components/Risk Factors/Patient Goals at Admission:  Personal Goals and Risk Factors at Admission - 06/14/21 1105       Core Components/Risk Factors/Patient Goals on Admission    Weight Management Weight Maintenance    Lipids Yes  Intervention Provide education and support for participant on nutrition & aerobic/resistive exercise along with prescribed medications to achieve LDL <54m, HDL >445m    Expected Outcomes Short Term: Participant states understanding of desired cholesterol values and is compliant with medications prescribed. Participant is following exercise prescription and nutrition guidelines.;Long Term: Cholesterol controlled with medications as prescribed, with individualized exercise RX and with personalized nutrition plan. Value goals: LDL < 7018mHDL > 40 mg.             Core Components/Risk Factors/Patient Goals Review:   Goals and Risk Factor Review     Row Name 06/30/21 1358 07/19/21 1613 08/17/21 1438         Core Components/Risk Factors/Patient Goals Review   Personal Goals Review Weight Management/Obesity;Lipids Weight Management/Obesity;Lipids Weight Management/Obesity;Lipids     Review CynTranicearted exercise at cardiac rehab on 06/29/21. CynNakirad well with exercise for her fitness level as CynLasandra deconditioned. CynWaunettaes well with exercise at cardiac rehab when in attendance. Jaylie's vital signs have been stable. CynAleeahes chair exercises to decrease her chances of feeling light headed with exercise. CynLashawndaes well with exercise at cardiac rehab when in attendance. Lekeshia's vital signs have been stable. CynFindleyll complete cardiac rehab on 08/17/21.     Expected Outcomes CynDeionnall continue to participate in phase 2 cardiac rehab for exercise, nutrition and life style modifications. CynLianettell continue to participate in phase 2 cardiac rehab for exercise, nutrition and life style modifications. CynRyonnall continue to  exercise,  follow nutrition and life style modifications.              Core Components/Risk Factors/Patient Goals at Discharge (Final Review):   Goals and Risk Factor Review - 08/17/21 1438       Core Components/Risk Factors/Patient Goals  Review   Personal Goals Review Weight Management/Obesity;Lipids    Review CynTaytumes well with exercise at cardiac rehab when in attendance. [name]'s vital signs have been stable. CynZamanthall complete cardiac rehab on 08/17/21.    Expected Outcomes CynGiavannall continue to  exercise,  follow nutrition and life style modifications.             ITP Comments:  ITP Comments     Row Name 06/14/21 1123 06/30/21 1317 07/19/21 1607 08/17/21 1433     ITP Comments Dr TraFransico Him, Medical Director 30 Day ITP Review. CynAloisearted cardiac rehab on 06/29/21 and did well with exercise for her fitness level 30 Day ITP Review. CynNabrias good participation when in attendance. Instructed patient to make sure she feels well when coming to exercise. 30 Day ITP Review. CynElainahs good participation when in attendance.CynKerstonll complete phase 2 cardiac rehab on 08/19/21             Comments: See ITP comments.MarHarrell Gave BSN

## 2021-08-17 NOTE — Progress Notes (Unsigned)
PATIENT: Rachel Clark DOB: 23-May-1951  REASON FOR VISIT: Follow up HISTORY FROM: Patient PRIMARY NEUROLOGIST:   HISTORY OF PRESENT ILLNESS: Today 08/17/21 Rachel Clark here today for follow-up with history of seizures well-controlled on carbamazepine and Zarontin. Saw Rachel Clark in June 2022 for syncopal episodes.  Has a pacemaker that failed to show cardiac arrhythmias.  Follows with cardiology.  Echocardiogram showed no acute abnormalities  HISTORY  12/21/2020 Rachel Clark: Rachel Clark is a 71 year old right-handed white female with a history of seizures since she was a child, she has not had a seizure in over 37 years, she is on carbamazepine and Zarontin, she has never wished to try to get off of her medication.  Her son also has a history of seizures.  Since August 2019 she has had low sodium levels.  She has been on a combination of carbamazepine and Rachel Clark, but she has been on this for quite a number of years without any sodium level problems.  The patient has more recently had significant issues with orthostatic hypotension.  She does have a monoclonal antibody and is followed through hematology.  She has not had a demonstrated peripheral neuropathy on nerve conduction studies in the past.  The patient however has had significant issues with being dizzy with standing, she has had multiple syncopal episodes, the most recent was 2 days ago.  The patient has been to the emergency room on 04 September 2020 after she hit her head with a blackout.  She went to the emergency room on 10 Nov 2020 with another significant blackout.  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  The patient is followed by Rachel Clark.  Blood pressure medications have been reduced in dosing.  The patient has gone from 10 mg a day to 2.5 mg and Norvasc, and the Coreg dose was cut in half.  The patient has been told to increase fluids and salt.  She does have  compression stockings.  The patient has had some decline in memory over time.  She is no longer driving mainly due to the dizziness issue.  The patient has had increasing problems with remembering recipes with cooking, she is still managing her medications.  Her husband helps her with the appointments.  The patient is repeating herself frequently throughout the day.  The patient claims that she feels bored, isolated.  She sleeps quite a bit throughout the day.  She comes to this office for further evaluation.  The patient does have a prior history of depression which is still an issue for her.    REVIEW OF SYSTEMS: Out of a complete 14 system review of symptoms, the patient complains only of the following symptoms, and all other reviewed systems are negative.  ALLERGIES: Allergies  Allergen Reactions   Sulfamethoxazole Anaphylaxis    Don't recall   Aspirin Other (See Comments)    GI upset- can tolerate 81 mg ASA, just not full doses   Crestor [Rosuvastatin]     myalgias   Erythromycin Nausea Only    Gi upset   Flonase [Fluticasone] Other (See Comments)    Sore throat   Lisinopril Cough   Niacin And Related Other (See Comments)    Don't recall   Statins Other (See Comments)    Muscle pain   Sulfa Drugs Cross Reactors Swelling    Don't recall   Tetracyclines & Related Nausea Only   Xeloda [Capecitabine] Diarrhea  Penicillins Nausea Only and Rash        Plavix [Clopidogrel Bisulfate] Rash    HOME MEDICATIONS: Outpatient Medications Prior to Visit  Medication Sig Dispense Refill   acetaminophen (TYLENOL) 500 MG tablet Take 500-1,000 mg by mouth daily as needed for mild pain or headache.     aspirin 81 MG chewable tablet Chew 1 tablet (81 mg total) by mouth daily.     Azelastine HCl 0.15 % SOLN Place 1 spray into both nostrils daily as needed (seasonal allergies).  12   Bempedoic Acid-Ezetimibe (NEXLIZET) 180-10 MG TABS Take 180 mg by mouth daily. 90 tablet 3   calcium carbonate  (TUMS - DOSED IN MG ELEMENTAL CALCIUM) 500 MG chewable tablet Chew 500 mg by mouth daily.     carbamazepine (TEGRETOL XR) 200 MG 12 hr tablet Take 3 tablets (600 mg total) by mouth 2 (two) times daily. 180 tablet 11   Cholecalciferol (VITAMIN D) 50 MCG (2000 UT) CAPS Take 2,000 Units by mouth daily.     DULoxetine (Rachel Clark) 60 MG capsule Take 60 mg by mouth 2 (two) times daily.     ethosuximide (ZARONTIN) 250 MG capsule TAKE 2 CAPSULES IN THE MORNING, TAKE 2 CAPSULES AT NIGHT 360 capsule 3   levothyroxine (SYNTHROID, LEVOTHROID) 50 MCG tablet Take 50 mcg by mouth daily before breakfast.     memantine (NAMENDA) 10 MG tablet TAKE 1 TABLET BY MOUTH TWICE A DAY 180 tablet 1   nitroGLYCERIN (NITROSTAT) 0.4 MG SL tablet Place 1 tablet (0.4 mg total) under the tongue every 5 (five) minutes as needed. For chest pain. 25 tablet 11   pantoprazole (PROTONIX) 40 MG tablet Take 1 tablet (40 mg total) by mouth 2 (two) times daily. AFTER 2 MONTHS CONT AS ONCE A DAY (Patient taking differently: Take 40 mg by mouth daily.) 60 tablet 1   prasugrel (EFFIENT) 10 MG TABS tablet Take 1 tablet (10 mg total) by mouth daily. 90 tablet 3   tiZANidine (ZANAFLEX) 2 MG tablet Take 2 mg by mouth every 6 (six) hours as needed for muscle spasms.     No facility-administered medications prior to visit.    PAST MEDICAL HISTORY: Past Medical History:  Diagnosis Date   Arthritis    "fingers" (06/29/2016)   Chronic lower back pain    Colon cancer ([place]) 12/04/2011   s/p Laparoscopic-assisted transverse colectomy on 12/19/2011 by Dr. Donne Hazel.  pT3 N0 M0.    Coronary artery disease    a. remote NSTEMI with PCI to the RCA; b. S/P CABG in 2007; c. 2010 - 2 v CAD with L-LAD and S-RCA patent, small caliber Dx 70% (treated medically - no amenable to PCI), CFX free of significant disease, normal LVF. d. 10/2014 Lexi MV: EF 61%, no ischemia/infarct.   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   last seizure was in 1972 ;takes Tegretol (06/29/2016)    Headache    "weekly" (06/29/2016)   Hyperlipidemia    Hypothyroid    Iron deficiency anemia 11/17/2011   Memory change 11/13/2016   Monoclonal gammopathy of unknown significance    Orthostatic hypotension 12/21/2020   Presence of permanent cardiac pacemaker    Syncope and collapse    pacemaker implanted    PAST SURGICAL HISTORY: Past Surgical History:  Procedure Laterality Date   ANTERIOR CERVICAL DECOMP/DISCECTOMY FUSION  1980's  BACK SURGERY     CARDIAC CATHETERIZATION  05/20/2009   obstructive native vessel disease in LAD, RCA, and first diagonal, patent vein graft to distal RCA and LIMA to LAD,normal. ef 60%   CARDIAC CATHETERIZATION N/A 03/24/2015   Procedure: Left Heart Cath and Cors/Grafts Angiography;  Surgeon: Wellington Hampshire, MD;  Location: [facility];  Service: Cardiovascular;  Laterality: N/A;   CARDIAC CATHETERIZATION N/A 07/28/2015   Procedure: Left Heart Cath and Coronary Angiography;  Surgeon: Troy Sine, MD;  Location: [facility];  Service: Cardiovascular;  Laterality: N/A;   CARDIAC CATHETERIZATION N/A 11/09/2015   Procedure: Coronary Stent Intervention;  Surgeon: Sherren Mocha, MD;  Location: [facility];  Service: Cardiovascular;  Laterality: N/A;   CARDIAC CATHETERIZATION N/A 11/09/2015   Procedure: Left Heart Cath and Coronary Angiography;  Surgeon: Sherren Mocha, MD;  Location: [facility];  Service: Cardiovascular;  Laterality: N/A;   CARDIAC CATHETERIZATION N/A 06/29/2016   Procedure: Left Heart Cath and Coronary Angiography;  Surgeon: Nelva Bush, MD;  Location: [facility];  Service: Cardiovascular;  Laterality: N/A;   CARDIAC CATHETERIZATION N/A 06/29/2016   Procedure: Intravascular Pressure Wire/FFR Study;  Surgeon: Nelva Bush, MD;  Location: [facility];   Service: Cardiovascular;  Laterality: N/A;   CARDIAC CATHETERIZATION N/A 06/29/2016   Procedure: Coronary Balloon Angioplasty;  Surgeon: Nelva Bush, MD;  Location: [facility];  Service: Cardiovascular;  Laterality: N/A;   COLON RESECTION  12/19/2011   Procedure: COLON RESECTION LAPAROSCOPIC;  Surgeon: Rolm Bookbinder, MD;  Location: [facility];  Service: General;  Laterality: N/A;  laparoscopic hand assisted partial colon resection   COLON SURGERY     COLONOSCOPY     CORONARY ANGIOPLASTY WITH STENT PLACEMENT  ~ 2007   1 stent   CORONARY ARTERY BYPASS GRAFT  2007   "CABG X2"   DILATION AND CURETTAGE OF UTERUS  1973   EP IMPLANTABLE DEVICE N/A 03/25/2015   MDT Advisa DR pacemaker implanted by Dr Rayann Heman for transient complete heart block and syncope   ESOPHAGOGASTRODUODENOSCOPY (EGD) WITH PROPOFOL N/A 03/14/2021   Procedure: ESOPHAGOGASTRODUODENOSCOPY (EGD) WITH PROPOFOL;  Surgeon: Ronnette Juniper, MD;  Location: [facility];  Service: Gastroenterology;  Laterality: N/A;   HEMOSTASIS CLIP PLACEMENT  03/14/2021   Procedure: HEMOSTASIS CLIP PLACEMENT;  Surgeon: Ronnette Juniper, MD;  Location: [facility];  Service: Gastroenterology;;   INSERT / REPLACE / [place] CATH AND CORS/GRAFTS ANGIOGRAPHY N/A 02/21/2018   Procedure: LEFT HEART CATH AND CORS/GRAFTS ANGIOGRAPHY;  Surgeon: Lorretta Harp, MD;  Location: [facility];  Service: Cardiovascular;  Laterality: N/A;   LEFT HEART CATH AND CORS/GRAFTS ANGIOGRAPHY N/A 03/08/2021   Procedure: LEFT HEART CATH AND CORS/GRAFTS ANGIOGRAPHY;  Surgeon: Clark, Peter M, MD;  Location: [facility];  Service: Cardiovascular;  Laterality: N/A;   PORT-A-CATH REMOVAL N/A 06/12/2013   Procedure: REMOVAL PORT-A-CATH;  Surgeon: Rolm Bookbinder, MD;  Location: [facility];  Service: General;  Laterality: N/A;   PORTACATH PLACEMENT  06/04/2012   Procedure: INSERTION PORT-A-CATH;  Surgeon: Rolm Bookbinder, MD;   Location: [facility];  Service: General;  Laterality: N/A;  Insertion of port-a-cath    POSTERIOR LAMINECTOMY / [place]  03/14/2021   Procedure: SCLEROTHERAPY;  Surgeon: Ronnette Juniper, MD;  Location: [facility];  Service: Gastroenterology;;   VAGINAL HYSTERECTOMY      FAMILY HISTORY: Family History  Problem Relation Age of Onset   Heart attack Father    Heart disease Father    Heart attack Brother 7   Cancer Mother        lung   Stroke Neg Hx     SOCIAL HISTORY: Social History   Socioeconomic History   Marital status: Married    Spouse name: Not on file   Number of children: 2   Years of education: 12   Highest education level: Not on file  Occupational History   Occupation: Retired  Tobacco Use   Smoking status: Former    Packs/day: 0.30    Years: 20.00    Pack years: 6.00    Types: Cigarettes    Quit date: 06/06/1981    Years since quitting: 40.2   Smokeless tobacco: Never  Vaping Use   Vaping Use: Never used  Substance and Sexual Activity   Alcohol use: Yes    Alcohol/week: 1.0 standard drink    Types: 1 Glasses of wine per week    Comment: occ   Drug use: No   Sexual activity: Not Currently    Birth control/protection: Surgical  Other Topics Concern   Not on file  Social History Narrative   Patient is married with 2 children.   Patient is right handed.   Patient has a high school education with some college education.   Patient drinks 2 cups daily.   Social Determinants of Health   Financial Resource Strain: Not on file  Food Insecurity: Not on file  Transportation Needs: Not on file  Physical Activity: Not on file  Stress: Not on file  Social Connections: Not on file  Intimate Partner Violence: Not on file      PHYSICAL EXAM  There were no vitals filed for this visit. There is no height or weight on file to calculate BMI.  Generalized: Well developed, in no acute distress   Neurological examination   Mentation: Alert oriented to time, place, history taking. Follows all commands speech and language fluent Cranial nerve II-XII: Pupils were equal round reactive to light. Extraocular movements were full, visual field were full on confrontational test. Facial sensation and strength were normal. Uvula tongue midline. Head turning and shoulder shrug  were normal and symmetric. Motor: The motor testing reveals 5 over 5 strength of all 4 extremities. Good symmetric motor tone is noted throughout.  Sensory: Sensory testing is intact to soft touch on all 4 extremities. No evidence of extinction is noted.  Coordination: Cerebellar testing reveals good finger-nose-finger and heel-to-shin bilaterally.  Gait and station: Gait is normal. Tandem gait is normal. Romberg is negative. No drift is seen.  Reflexes: Deep tendon reflexes are symmetric and normal bilaterally.   DIAGNOSTIC DATA (LABS, IMAGING, TESTING) - I reviewed patient records, labs, notes, testing and imaging myself where available.  Lab Results  Component Value Date   WBC 10.0 03/17/2021   HGB 9.6 (L) 03/17/2021   HCT 27.5 (L) 03/17/2021   MCV 87.9 03/17/2021   PLT 221 03/17/2021      Component Value Date/Time   NA 133 (L) 03/17/2021 0550   NA 132 (L) 01/19/2021 1129   NA 137 07/11/2016 0820   K 3.9 03/17/2021 1304   K 4.4 07/11/2016 0820   CL 99 03/17/2021 0550   CL 106 12/25/2012 0924   CO2 25 03/17/2021 0550   CO2 27 07/11/2016 0820   GLUCOSE 125 (H) 03/17/2021 0550   GLUCOSE 115 07/11/2016 0820   GLUCOSE  105 (H) 12/25/2012 0924   BUN 16 03/17/2021 0550   BUN 14 01/19/2021 1129   BUN 15.6 07/11/2016 0820   CREATININE 0.68 03/17/2021 0550   CREATININE 0.8 07/11/2016 0820   CALCIUM 7.9 (L) 03/17/2021 0550   CALCIUM 8.9 07/11/2016 0820   PROT 6.5 03/13/2021 0927   PROT 6.8 01/19/2021 1132   PROT 7.1 07/11/2016 0820   ALBUMIN 3.3 (L) 03/13/2021 0927   ALBUMIN 4.2 01/19/2021 1132   ALBUMIN 3.6 07/11/2016 0820   AST 17  03/13/2021 0927   AST 13 07/11/2016 0820   ALT 14 03/13/2021 0927   ALT 14 07/11/2016 0820   ALKPHOS 43 03/13/2021 0927   ALKPHOS 89 07/11/2016 0820   BILITOT 0.5 03/13/2021 0927   BILITOT 0.2 01/19/2021 1132   BILITOT 0.29 07/11/2016 0820   GFRNONAA >60 03/17/2021 0550   GFRAA 97 08/17/2020 1111   Lab Results  Component Value Date   CHOL 157 03/09/2021   HDL 68 03/09/2021   LDLCALC 72 03/09/2021   TRIG 86 03/09/2021   CHOLHDL 2.3 03/09/2021   Lab Results  Component Value Date   HGBA1C 5.5 02/20/2018   Lab Results  Component Value Date   VITAMINB12 290 11/13/2016   Lab Results  Component Value Date   TSH 1.090 07/12/2020      ASSESSMENT AND PLAN 71 y.o. year old female       Evangeline Dakin, [place] 08/17/2021, 5:34 AM [facility] [address] [place], [place] [address] [phone]

## 2021-08-17 NOTE — Telephone Encounter (Signed)
Pt's husband accepted Dr Gladstone Lighter next available and is on wait list, this is FYI for POD 2 no call back requested

## 2021-08-17 NOTE — Telephone Encounter (Signed)
Pt's husband states pt's pcp made the suggestion that pt now see Dr Leta Baptist  since Dr Jannifer Franklin has retired.  Husband states pt is not feeling well today and will be unable to make today's appointment but does want to know if the pt can go ahead and be schedule to see Dr Leta Baptist .  Pt's husband wants to know if Judson Roch was going to make the recommendation for pt to see another provider in the office, please call.

## 2021-08-19 ENCOUNTER — Encounter (HOSPITAL_COMMUNITY): Payer: Medicare Other

## 2021-08-19 ENCOUNTER — Telehealth (HOSPITAL_COMMUNITY): Payer: Self-pay | Admitting: *Deleted

## 2021-08-19 NOTE — Telephone Encounter (Signed)
Patient's husband left message on department voicemail.Rachel Clark absent from cardiac rehab today, very lightheaded trouble standing. Will bring medication for nurse to review next week.

## 2021-08-24 NOTE — Progress Notes (Deleted)
Discharge Progress Report  Patient Details  Name: Rachel Clark MRN: [id number] Date of Birth: 05-22-1951 Referring Provider:   Flowsheet Row CARDIAC REHAB PHASE II ORIENTATION from 06/21/2021 in [facility]  Referring Provider Rachel [name], MD        Number of Visits: 13  Reason for Discharge:  Patient reached a stable level of exercise.  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  ADL UCSD:   Initial Exercise Prescription:  Initial Exercise Prescription - 06/21/21 1300       Date of Initial Exercise RX and Referring Provider   Date 06/21/21    Referring Provider Rachel [name], MD    Expected Discharge Date 08/12/21      NuStep   Level 1    SPM 75    Minutes 25    METs 2      Prescription Details   Frequency (times per week) 3    Duration Progress to 30 minutes of continuous aerobic without signs/symptoms of physical distress      Intensity   THRR 40-80% of Max Heartrate 60-119    Ratings of Perceived Exertion 11-13    Perceived Dyspnea 0-4      Progression   Progression Continue progressive overload as per policy without signs/symptoms or physical distress.      Resistance Training   Training Prescription Yes    Weight 2 lb    Reps 10-15             Discharge Exercise Prescription (Final Exercise Prescription Changes):  Exercise Prescription Changes - 08/17/21 1500       Response to Exercise   Blood Pressure (Admit) 142/70    Blood Pressure (Exercise) 122/62    Blood Pressure (Exit) 102/60    Heart Rate (Admit) 99 bpm    Heart Rate (Exercise) 114 bpm    Heart Rate (Exit) 99 bpm    Rating of Perceived Exertion (Exercise) 13    Symptoms None    Comments Pt's last  day of exercise in the [facility] program    Duration Continue with 30 min of aerobic exercise without signs/symptoms of physical distress.    Intensity THRR unchanged      Progression   Progression Continue to progress workloads to maintain intensity without signs/symptoms of physical distress.    Average METs 2.3      Resistance Training   Training Prescription No    Weight No weights on Wednesdays      Interval Training   Interval Training No      NuStep   Level 1    SPM 80    Minutes 30    METs 2.3      Home Exercise Plan   Plans to continue exercise at Home (comment)    Frequency Add 2 additional days to program exercise sessions.    Initial Home Exercises Provided 07/27/21             Functional Capacity:  6 Minute Walk     Row Name 06/21/21 1150 08/10/21 1308       6 Minute Walk   Phase Initial Discharge    Distance 1064 feet 1139  feet    Distance % Change -- 7.05 %    Distance Feet Change -- 75 ft    Walk Time 6 minutes 6 minutes    # of Rest Breaks 0 0    MPH 2.02 2.16    METS 2.72 3    RPE 9 9    Perceived Dyspnea  0 0    VO2 Peak 9.52 10.51    Symptoms No Yes (comment)    Comments -- some lightheadedness after sitting    Resting HR 96 bpm 91 bpm    Resting BP 144/80 124/70    Resting Oxygen Saturation  99 % 99 %    Exercise Oxygen Saturation  during 6 min walk 99 % 99 %    Max Ex. HR 108 bpm 126 bpm    Max Ex. BP 158/82 156/74    2 Minute Post BP 138/80 --             Psychological, QOL, Others - Outcomes: PHQ 2/9: Depression screen PHQ 2/9 06/14/2021  Decreased Interest 1  Down, Depressed, Hopeless 0  PHQ - 2 Score 1  Altered sleeping 0  Tired, decreased energy 3  Change in appetite 0  Feeling bad or failure about yourself  0  Trouble concentrating 1  Moving slowly or fidgety/restless 0  Suicidal thoughts 0  PHQ-9 Score 5  Difficult doing work/chores Not difficult at all  Some recent data might be hidden    Quality of Life:   Quality of Life - 08/12/21 1600       Quality of Life   Select Quality of Life      Quality of Life Scores   Health/Function Pre 28.67 %    Health/Function Post 26.1 %    Health/Function % Change -8.96 %    Socioeconomic Pre 29.58 %    Socioeconomic Post 26.29 %    Socioeconomic % Change  -11.12 %    Psych/Spiritual Pre 30 %    Psych/Spiritual Post 24 %    Psych/Spiritual % Change -20 %    Family Pre 30 %    Family Post 28.8 %    Family % Change -4 %    GLOBAL Pre 29.32 %    GLOBAL Post 26.1 %    GLOBAL % Change -10.98 %             Personal Goals: Goals established at orientation with interventions provided to work toward goal.  Personal Goals and Risk Factors at Admission - 06/14/21 1105       Core Components/Risk Factors/Patient Goals on Admission    Weight Management Weight Maintenance    Lipids Yes    Intervention Provide education and support for participant on nutrition & aerobic/resistive exercise along with prescribed medications to achieve LDL '70mg'$ , HDL >$Remo'40mg'dDqSu$ .    Expected Outcomes Short Term: Participant states understanding of desired cholesterol values and is compliant with medications prescribed. Participant is following exercise prescription and nutrition guidelines.;Long Term: Cholesterol controlled with medications as prescribed, with individualized exercise RX and with personalized nutrition plan. Value goals: LDL < $Rem'70mg'oZrx$ , HDL > 40 mg.              Personal Goals Discharge:  Goals and Risk Factor Review     Row Name 06/30/21 1358 07/19/21 1613 08/17/21 1438         Core Components/Risk Factors/Patient Goals Review   Personal Goals Review Weight Management/Obesity;Lipids Weight Management/Obesity;Lipids Weight Management/Obesity;Lipids     Review Rachel Clark  started exercise at cardiac rehab on 06/29/21. Rachel Clark did well with exercise for her fitness level as Rachel Clark is deconditioned. Rachel Clark does well with exercise at cardiac rehab when in  attendance. Rachel Clark's vital signs have been stable. Rachel Clark does chair exercises to decrease her chances of feeling light headed with exercise. Rachel Clark does well with exercise at cardiac rehab when in attendance. Rachel Clark's vital signs have been stable. Rachel Clark will complete cardiac rehab on 08/17/21.     Expected Outcomes Rachel Clark will continue to participate in phase 2 cardiac rehab for exercise, nutrition and life style modifications. Olinda will continue to participate in phase 2 cardiac rehab for exercise, nutrition and life style modifications. Temperence will continue to  exercise,  follow nutrition and life style modifications.              Exercise Goals and Review:  Exercise Goals     Row Name 06/14/21 1113             Exercise Goals   Increase Physical Activity Yes       Intervention Provide advice, education, support and counseling about physical activity/exercise needs.;Develop an individualized exercise prescription for aerobic and resistive training based on initial evaluation findings, risk stratification, comorbidities and participant's personal goals.       Expected Outcomes Short Term: Attend rehab on a regular basis to increase amount of physical activity.;Long Term: Add in home exercise to make exercise part of routine and to increase amount of physical activity.;Long Term: Exercising regularly at least 3-5 days a week.       Increase Strength and Stamina Yes       Intervention Provide advice, education, support and counseling about physical activity/exercise needs.;Develop an individualized exercise prescription for aerobic and resistive training based on initial evaluation findings, risk stratification, comorbidities and participant's personal goals.       Expected Outcomes Short Term: Increase workloads from initial exercise prescription for resistance, speed, and METs.;Short Term: Perform resistance training exercises routinely during rehab and add in resistance training  at home;Long Term: Improve cardiorespiratory fitness, muscular endurance and strength as measured by increased METs and functional capacity (6MWT)       Able to understand and use rate of perceived exertion (RPE) scale Yes       Intervention Provide education and explanation on how to use RPE scale       Expected Outcomes Short Term: Able to use RPE daily in rehab to express subjective intensity level;Long Term:  Able to use RPE to guide intensity level when exercising independently       Knowledge and understanding of Target Heart Rate Range (THRR) Yes       Intervention Provide education and explanation of THRR including how the numbers were predicted and where they are located for reference       Expected Outcomes Short Term: Able to state/look up THRR;Long Term: Able to use THRR to govern intensity when exercising independently;Short Term: Able to use daily as guideline for intensity in rehab       Understanding of Exercise Prescription Yes       Intervention Provide education, explanation, and written materials on patient's individual exercise prescription       Expected Outcomes Short Term: Able to explain program exercise prescription;Long Term: Able to explain home exercise prescription to exercise independently                Exercise Goals Re-Evaluation:  Exercise Goals Re-Evaluation     Row Name 06/29/21  1625 07/27/21 1429 08/19/21 1500         Exercise Goal Re-Evaluation   Exercise Goals Review Increase Physical Activity;Increase Strength and Stamina;Able to understand and use rate of perceived exertion (RPE) scale;Knowledge and understanding of Target Heart Rate Range (THRR);Understanding of Exercise Prescription Increase Physical Activity;Increase Strength and Stamina;Able to understand and use rate of perceived exertion (RPE) scale;Knowledge and understanding of Target Heart Rate Range (THRR);Understanding of Exercise Prescription Increase Physical Activity;Increase Strength  and Stamina;Able to understand and use rate of perceived exertion (RPE) scale;Knowledge and understanding of Target Heart Rate Range (THRR);Understanding of Exercise Prescription     Comments Pt's first day in the [facility] program. Pt understands the exercise Rx, THRR and RPE scale. Reviewed goals and home exercise Rx. Pt voices some progress to goals. Pt voices she feeling better, stronger and  having less lightheadedness. Pt walking at home more with the dog and wants to use her stationary bike. Pt did not attend today due to lightheadedness: Pt had reported eariler that she is walking more at home and walking the dog. She voiced her plan was to walk with her husband after graduation from the [facility] program. She voiced feeling stronger and was able to do more around the house.     Expected Outcomes Will continue to monitor patient and porgress exercise workloads as tolerated. Pt will exercise at home walking and using her stationary bike. Pt will continue to walk at home.              Nutrition & Weight - Outcomes:  Pre Biometrics - 06/14/21 1113       Pre Biometrics   Waist Circumference 41.5 inches    Hip Circumference 45 inches    Waist to Hip Ratio 0.92 %    Triceps Skinfold 20 mm    % Body Fat 39.1 %    Grip Strength 17 kg    Flexibility 14.5 in    Single Leg Stand 2.12 seconds             Post Biometrics - 08/10/21 1410        Post  Biometrics   Height 5' 7.5" (1.715 m)    Weight 81 kg    Waist Circumference 41.5 inches    Hip Circumference 45 inches    Waist to Hip Ratio 0.92 %    BMI (Calculated) 27.54    Triceps Skinfold 20 mm    % Body Fat 39.3 %    Grip Strength 23 kg    Flexibility 15.5 in    Single Leg Stand 2 seconds             Nutrition:   Nutrition Discharge:   Education Questionnaire Score:  Knowledge Questionnaire Score - 08/12/21 1600       Knowledge Questionnaire Score   Post Score 18/24             Goals reviewed with patient;  copy given to patient.Pt graduated from cardiac rehab program on 08/17/21 with completion of 13 exercise sessions in Phase II. Pt maintained fair attendance and progressed nicely during her participation in rehab as evidenced by increased MET level. Tericka did have some absences due to her orthstatisis . Repeat  PHQ score- 1 .  Pt has made significant lifestyle changes and should be commended for her success. Pt feels she has achieved her goals during cardiac rehab.   Pt plans to continue exercise by walking her dog with her husband when she is able.  Shakena increased her distance on her post exercise walk test by 75 feet. We are proud of Chivon's progress.Harrell Gave RN BSN

## 2021-08-30 NOTE — Progress Notes (Signed)
Discharge Progress Report  Patient Details  Name: Rachel Clark MRN: [id number] Date of Birth: 05-22-1951 Referring Provider:   Flowsheet Row CARDIAC REHAB PHASE II ORIENTATION from 06/21/2021 in [facility]  Referring Provider Peter [name], MD        Number of Visits: 13  Reason for Discharge:  Patient reached a stable level of exercise.  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  ADL UCSD:   Initial Exercise Prescription:  Initial Exercise Prescription - 06/21/21 1300       Date of Initial Exercise RX and Referring Provider   Date 06/21/21    Referring Provider Peter [name], MD    Expected Discharge Date 08/12/21      NuStep   Level 1    SPM 75    Minutes 25    METs 2      Prescription Details   Frequency (times per week) 3    Duration Progress to 30 minutes of continuous aerobic without signs/symptoms of physical distress      Intensity   THRR 40-80% of Max Heartrate 60-119    Ratings of Perceived Exertion 11-13    Perceived Dyspnea 0-4      Progression   Progression Continue progressive overload as per policy without signs/symptoms or physical distress.      Resistance Training   Training Prescription Yes    Weight 2 lb    Reps 10-15             Discharge Exercise Prescription (Final Exercise Prescription Changes):  Exercise Prescription Changes - 08/17/21 1500       Response to Exercise   Blood Pressure (Admit) 142/70    Blood Pressure (Exercise) 122/62    Blood Pressure (Exit) 102/60    Heart Rate (Admit) 99 bpm    Heart Rate (Exercise) 114 bpm    Heart Rate (Exit) 99 bpm    Rating of Perceived Exertion (Exercise) 13    Symptoms None    Comments Pt's last  day of exercise in the CRP2 program    Duration Continue with 30 min of aerobic exercise without signs/symptoms of physical distress.    Intensity THRR unchanged      Progression   Progression Continue to progress workloads to maintain intensity without signs/symptoms of physical distress.    Average METs 2.3      Resistance Training   Training Prescription No    Weight No weights on Wednesdays      Interval Training   Interval Training No      NuStep   Level 1    SPM 80    Minutes 30    METs 2.3      Home Exercise Plan   Plans to continue exercise at Home (comment)    Frequency Add 2 additional days to program exercise sessions.    Initial Home Exercises Provided 07/27/21             Functional Capacity:  6 Minute Walk     Row Name 06/21/21 1150 08/10/21 1308       6 Minute Walk   Phase Initial Discharge    Distance 1064 feet 1139  feet    Distance % Change -- 7.05 %    Distance Feet Change -- 75 ft    Walk Time 6 minutes 6 minutes    # of Rest Breaks 0 0    MPH 2.02 2.16    METS 2.72 3    RPE 9 9    Perceived Dyspnea  0 0    VO2 Peak 9.52 10.51    Symptoms No Yes (comment)    Comments -- some lightheadedness after sitting    Resting HR 96 bpm 91 bpm    Resting BP 144/80 124/70    Resting Oxygen Saturation  99 % 99 %    Exercise Oxygen Saturation  during 6 min walk 99 % 99 %    Max Ex. HR 108 bpm 126 bpm    Max Ex. BP 158/82 156/74    2 Minute Post BP 138/80 --             Psychological, QOL, Others - Outcomes: PHQ 2/9: Depression screen [facility] 2/9 08/30/2021 06/14/2021  Decreased Interest 0 1  Down, Depressed, Hopeless 1 0  PHQ - 2 Score 1 1  Altered sleeping - 0  Tired, decreased energy - 3  Change in appetite - 0  Feeling bad or failure about yourself  - 0  Trouble concentrating - 1  Moving slowly or fidgety/restless - 0  Suicidal thoughts - 0  PHQ-9 Score - 5  Difficult doing work/chores - Not difficult at all  Some recent data might be  hidden    Quality of Life:  Quality of Life - 08/12/21 1600       Quality of Life   Select Quality of Life      Quality of Life Scores   Health/Function Pre 28.67 %    Health/Function Post 26.1 %    Health/Function % Change -8.96 %    Socioeconomic Pre 29.58 %    Socioeconomic Post 26.29 %    Socioeconomic % Change  -11.12 %    Psych/Spiritual Pre 30 %    Psych/Spiritual Post 24 %    Psych/Spiritual % Change -20 %    Family Pre 30 %    Family Post 28.8 %    Family % Change -4 %    GLOBAL Pre 29.32 %    GLOBAL Post 26.1 %    GLOBAL % Change -10.98 %             Personal Goals: Goals established at orientation with interventions provided to work toward goal.  Personal Goals and Risk Factors at Admission - 06/14/21 1105       Core Components/Risk Factors/Patient Goals on Admission    Weight Management Weight Maintenance    Lipids Yes    Intervention Provide education and support for participant on nutrition & aerobic/resistive exercise along with prescribed medications to achieve LDL <41m, HDL >424m    Expected Outcomes Short Term: Participant states understanding of desired cholesterol values and is compliant with medications prescribed. Participant is following exercise prescription and nutrition guidelines.;Long Term: Cholesterol controlled with medications as prescribed, with individualized exercise RX and with personalized nutrition plan. Value goals: LDL < 7089mHDL > 40 mg.              Personal Goals Discharge:  Goals and Risk Factor Review     Row Name 06/30/21 1358 07/19/21 1613 08/17/21 1438         Core Components/Risk Factors/Patient Goals Review   Personal Goals  Review Weight Management/Obesity;Lipids Weight Management/Obesity;Lipids Weight Management/Obesity;Lipids     Review Allene started exercise at cardiac rehab on 06/29/21. Ryver did well with exercise for her fitness level as Chealsea is deconditioned. Rainie does well with exercise at  cardiac rehab when in attendance. Bernetta's vital signs have been stable. Ife does chair exercises to decrease her chances of feeling light headed with exercise. Cherly does well with exercise at cardiac rehab when in attendance. Nickole's vital signs have been stable. Raelea will complete cardiac rehab on 08/17/21.     Expected Outcomes Bryce will continue to participate in phase 2 cardiac rehab for exercise, nutrition and life style modifications. Reygan will continue to participate in phase 2 cardiac rehab for exercise, nutrition and life style modifications. Nyella will continue to  exercise,  follow nutrition and life style modifications.              Exercise Goals and Review:  Exercise Goals     Row Name 06/14/21 1113             Exercise Goals   Increase Physical Activity Yes       Intervention Provide advice, education, support and counseling about physical activity/exercise needs.;Develop an individualized exercise prescription for aerobic and resistive training based on initial evaluation findings, risk stratification, comorbidities and participant's personal goals.       Expected Outcomes Short Term: Attend rehab on a regular basis to increase amount of physical activity.;Long Term: Add in home exercise to make exercise part of routine and to increase amount of physical activity.;Long Term: Exercising regularly at least 3-5 days a week.       Increase Strength and Stamina Yes       Intervention Provide advice, education, support and counseling about physical activity/exercise needs.;Develop an individualized exercise prescription for aerobic and resistive training based on initial evaluation findings, risk stratification, comorbidities and participant's personal goals.       Expected Outcomes Short Term: Increase workloads from initial exercise prescription for resistance, speed, and METs.;Short Term: Perform resistance training exercises routinely during rehab and add  in resistance training at home;Long Term: Improve cardiorespiratory fitness, muscular endurance and strength as measured by increased METs and functional capacity (6MWT)       Able to understand and use rate of perceived exertion (RPE) scale Yes       Intervention Provide education and explanation on how to use RPE scale       Expected Outcomes Short Term: Able to use RPE daily in rehab to express subjective intensity level;Long Term:  Able to use RPE to guide intensity level when exercising independently       Knowledge and understanding of Target Heart Rate Range (THRR) Yes       Intervention Provide education and explanation of THRR including how the numbers were predicted and where they are located for reference       Expected Outcomes Short Term: Able to state/look up THRR;Long Term: Able to use THRR to govern intensity when exercising independently;Short Term: Able to use daily as guideline for intensity in rehab       Understanding of Exercise Prescription Yes       Intervention Provide education, explanation, and written materials on patient's individual exercise prescription       Expected Outcomes Short Term: Able to explain program exercise prescription;Long Term: Able to explain home exercise prescription to exercise independently                Exercise  Goals Re-Evaluation:  Exercise Goals Re-Evaluation     Row Name 06/29/21 1625 07/27/21 1429 08/19/21 1500         Exercise Goal Re-Evaluation   Exercise Goals Review Increase Physical Activity;Increase Strength and Stamina;Able to understand and use rate of perceived exertion (RPE) scale;Knowledge and understanding of Target Heart Rate Range (THRR);Understanding of Exercise Prescription Increase Physical Activity;Increase Strength and Stamina;Able to understand and use rate of perceived exertion (RPE) scale;Knowledge and understanding of Target Heart Rate Range (THRR);Understanding of Exercise Prescription Increase Physical  Activity;Increase Strength and Stamina;Able to understand and use rate of perceived exertion (RPE) scale;Knowledge and understanding of Target Heart Rate Range (THRR);Understanding of Exercise Prescription     Comments Pt's first day in the CRP2 program. Pt understands the exercise Rx, THRR and RPE scale. Reviewed goals and home exercise Rx. Pt voices some progress to goals. Pt voices she feeling better, stronger and  having less lightheadedness. Pt walking at home more with the dog and wants to use her stationary bike. Pt did not attend today due to lightheadedness: Pt had reported eariler that she is walking more at home and walking the dog. She voiced her plan was to walk with her husband after graduation from the [place] program. She voiced feeling stronger and was able to do more around the house.     Expected Outcomes Will continue to monitor patient and porgress exercise workloads as tolerated. Pt will exercise at home walking and using her stationary bike. Pt will continue to walk at home.              Nutrition & Weight - Outcomes:  Pre Biometrics - 06/14/21 1113       Pre Biometrics   Waist Circumference 41.5 inches    Hip Circumference 45 inches    Waist to Hip Ratio 0.92 %    Triceps Skinfold 20 mm    % Body Fat 39.1 %    Grip Strength 17 kg    Flexibility 14.5 in    Single Leg Stand 2.12 seconds             Post Biometrics - 08/10/21 1410        Post  Biometrics   Height 5' 7.5" (1.715 m)    Weight 81 kg    Waist Circumference 41.5 inches    Hip Circumference 45 inches    Waist to Hip Ratio 0.92 %    BMI (Calculated) 27.54    Triceps Skinfold 20 mm    % Body Fat 39.3 %    Grip Strength 23 kg    Flexibility 15.5 in    Single Leg Stand 2 seconds             Nutrition:   Nutrition Discharge:   Education Questionnaire Score:  Knowledge Questionnaire Score - 08/12/21 1600       Knowledge Questionnaire Score   Post Score 18/24            Goals  reviewed with patient; copy given to patient.Pt graduated from cardiac rehab program on 08/17/21 with completion of 13 exercise sessions in Phase II. Pt maintained fair attendance and progressed nicely during her participation in rehab as evidenced by increased MET level. Jizelle did have some absences due to her orthstatisis . Repeat  PHQ score- 1 .  Pt has made significant lifestyle changes and should be commended for her success. Pt feels she has achieved her goals during cardiac rehab.   Pt plans to continue  exercise by walking her dog with her husband when she is able. Naelle increased her distance on her post exercise walk test by 75 feet. We are proud of Nadja's progress.Harrell Gave RN BSN

## 2021-09-08 ENCOUNTER — Telehealth: Payer: Self-pay | Admitting: *Deleted

## 2021-09-08 NOTE — Telephone Encounter (Signed)
? ?  Pre-operative Risk Assessment  ?  ?Patient Name: Rachel Clark  ?DOB: 1951-01-12 ?MRN: [id number]  ? ?  ? ?Request for Surgical Clearance   ? ?Procedure:   Colonoscopy    ( history of colon polyps) ? ?Date of Surgery:  Clearance TBD                              ?   ?Surgeon:  Dr Michail Sermon ?Surgeon's Group or Practice Name:  [facility] ?Phone number:  [phone] ?Fax number:  [phone] ?  ?Type of Clearance Requested:   ?- Medical  ?- Pharmacy:  [facility] (Effient)   ?  ?Type of Anesthesia:   propofol ?  ?Additional requests/questions:   ? ?Signed, ?Trixie Dredge V   ?09/08/2021, 6:02 PM   ?

## 2021-09-09 NOTE — Telephone Encounter (Addendum)
Dr. 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 We are asked to hold effient for a colonoscopy, date TBD. She is about 6 months from her last PCI.  ? ?If she is able to hold effient, when do you recommend she could hold given recent PCI? ? ?

## 2021-09-12 NOTE — Telephone Encounter (Signed)
? ?  Name:  Rachel Clark  ?DOB:  01-Feb-1951  ?MRN:  [id number]  ? ?Primary Cardiologist: Peter [name], MD ? ?Chart reviewed as part of pre-operative protocol coverage. Pt presented with unstable angina and underwent repeat cardiac cath with placement of several stents 03/08/21.  ? ?Per Dr. [name], she may not hold effient for 12 months from PCI for elective colonoscopy. Please contact our office closer to rescheduled colonoscopy for updated clearance.  ? ?I will fax to requesting office and remove from preop pool. ? ? ?[name], PA ?09/12/2021, 1:27 PM ? ? ? ?

## 2021-09-13 ENCOUNTER — Encounter: Payer: Self-pay | Admitting: Diagnostic Neuroimaging

## 2021-09-13 ENCOUNTER — Ambulatory Visit (INDEPENDENT_AMBULATORY_CARE_PROVIDER_SITE_OTHER): Payer: Medicare Other | Admitting: Diagnostic Neuroimaging

## 2021-09-13 VITALS — BP 130/80 | HR 89 | Ht 68.0 in | Wt 178.0 lb

## 2021-09-13 DIAGNOSIS — R413 Other amnesia: Secondary | ICD-10-CM | POA: Diagnosis not present

## 2021-09-13 DIAGNOSIS — R569 Unspecified convulsions: Secondary | ICD-10-CM

## 2021-09-13 DIAGNOSIS — I2510 Atherosclerotic heart disease of native coronary artery without angina pectoris: Secondary | ICD-10-CM

## 2021-09-13 MED ORDER — MEMANTINE HCL 10 MG PO TABS: 10.0000 mg | ORAL_TABLET | Freq: Two times a day (BID) | ORAL | 4 refills | Status: DC

## 2021-09-13 MED ORDER — ETHOSUXIMIDE 250 MG PO CAPS: 500.0000 mg | ORAL_CAPSULE | Freq: Two times a day (BID) | ORAL | 4 refills | Status: DC

## 2021-09-13 MED ORDER — CARBAMAZEPINE ER 200 MG PO TB12: 600.0000 mg | ORAL_TABLET | Freq: Two times a day (BID) | ORAL | 4 refills | Status: DC

## 2021-09-13 NOTE — Progress Notes (Signed)
? ?[facility] ? ?PATIENT: Rachel Clark ?DOB: April 11, 1951 ? ?REFERRING CLINICIAN: Harlan Stains, MD ?HISTORY FROM: patient and husband ?REASON FOR VISIT: follow up ? ? ?HISTORICAL ? ?CHIEF COMPLAINT:  ?Chief Complaint  ?Patient presents with  ? Follow-up  ?  Rm 7 with husband here for transfer of care from Dr. Jannifer Franklin   ? ? ?HISTORY OF PRESENT ILLNESS:  ? ?UPDATE (09/13/21, [facility]): Since last visit, doing well. Symptoms are stable. No more seizures. Memory loss stable. No major changes in ADLs. No more syncope attacks. ? ?PRIOR HPI (12/21/20, Dr. Jannifer Franklin): Rachel Clark is a 71 year old right-handed white female with a history of seizures since she was a child, she has not had a seizure in over 50 years, she is on carbamazepine and Zarontin, she has never wished to try to get off of her medication.  Her son also has a history of seizures.  Since August 2019 she has had low sodium levels.  She has been on a combination of carbamazepine and Cymbalta, but she has been on this for quite a number of years without any sodium level problems.  The patient has more recently had significant issues with orthostatic hypotension.  She does have a monoclonal antibody and is followed through hematology.  She has not had a demonstrated peripheral neuropathy on nerve conduction studies in the past.  The patient however has had significant issues with being dizzy with standing, she has had multiple syncopal episodes, the most recent was 2 days ago.  The patient has been to the emergency room on 04 September 2020 after she hit her head with a blackout.  She went to the emergency room on 10 Nov 2020 with another significant blackout.  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  The patient is followed by Dr. Peter [name].  Blood pressure medications have been reduced in dosing.  The patient has gone from 10 mg a day to 2.5 mg and Norvasc, and the Coreg dose was cut  in half.  The patient has been told to increase fluids and salt.  She does have compression stockings.  The patient has had some decline in memory over time.  She is no longer driving mainly due to the dizziness issue.  The patient has had increasing problems with remembering recipes with cooking, she is still managing her medications.  Her husband helps her with the appointments.  The patient is repeating herself frequently throughout the day.  The patient claims that she feels bored, isolated.  She sleeps quite a bit throughout the day.  She comes to this office for further evaluation.  The patient does have a prior history of depression which is still an issue for her. ?  ? ?REVIEW OF SYSTEMS: Full 14 system review of systems performed and negative with exception of: as per HPI. ? ?ALLERGIES: ?Allergies  ?Allergen Reactions  ? Sulfamethoxazole Anaphylaxis  ?  Don't recall  ? Aspirin Other (See Comments)  ?  GI upset- can tolerate 81 mg ASA, just not full doses  ? Crestor [Rosuvastatin]   ?  myalgias  ? Erythromycin Nausea Only  ?  Gi upset  ? Flonase [Fluticasone] Other (See Comments)  ?  Sore throat  ? Lisinopril Cough  ? Niacin And Related Other (See Comments)  ?  Don't recall  ? Statins Other (See Comments)  ?  Muscle pain  ? Sulfa Drugs Cross Reactors Swelling  ?  Don't recall  ? Tetracyclines & Related Nausea Only  ? Xeloda [Capecitabine] Diarrhea  ? Penicillins Nausea Only and Rash  ?   ?  ? Plavix [Clopidogrel Bisulfate] Rash  ? ? ?HOME MEDICATIONS: ?Outpatient Medications Prior to Visit  ?Medication Sig Dispense Refill  ? acetaminophen (TYLENOL) 500 MG tablet Take 500-1,000 mg by mouth daily as needed for mild pain or headache.    ? aspirin 81 MG chewable tablet Chew 1 tablet (81 mg total) by mouth daily.    ? Bempedoic Acid-Ezetimibe (NEXLIZET) 180-10 MG TABS Take 180 mg by mouth daily. 90 tablet 3  ? calcium carbonate (TUMS - DOSED IN MG ELEMENTAL CALCIUM) 500 MG chewable tablet Chew 500 mg by mouth  daily.    ? Cholecalciferol (VITAMIN D) 50 MCG (2000 UT) CAPS Take 2,000 Units by mouth daily.    ? DULoxetine (CYMBALTA) 60 MG capsule Take 60 mg by mouth 2 (two) times daily.    ? levothyroxine (SYNTHROID, LEVOTHROID) 50 MCG tablet Take 50 mcg by mouth daily before breakfast.    ? nitroGLYCERIN (NITROSTAT) 0.4 MG SL tablet Place 1 tablet (0.4 mg total) under the tongue every 5 (five) minutes as needed. For chest pain. 25 tablet 11  ? prasugrel (EFFIENT) 10 MG TABS tablet Take 1 tablet (10 mg total) by mouth daily. 90 tablet 3  ? carbamazepine (TEGRETOL XR) 200 MG 12 hr tablet Take 3 tablets (600 mg total) by mouth 2 (two) times daily. 180 tablet 11  ? ethosuximide (ZARONTIN) 250 MG capsule TAKE 2 CAPSULES IN THE MORNING, TAKE 2 CAPSULES AT NIGHT 360 capsule 3  ? memantine (NAMENDA) 10 MG tablet TAKE 1 TABLET BY MOUTH TWICE A DAY 180 tablet 1  ? Azelastine HCl 0.15 % SOLN Place 1 spray into both nostrils daily as needed (seasonal allergies).  12  ? pantoprazole (PROTONIX) 40 MG tablet Take 1 tablet (40 mg total) by mouth 2 (two) times daily. AFTER 2 MONTHS CONT AS ONCE A DAY (Patient taking differently: Take 40 mg by mouth daily.) 60 tablet 1  ? tiZANidine (ZANAFLEX) 2 MG tablet Take 2 mg by mouth every 6 (six) hours as needed for muscle spasms. (Patient not taking: Reported on 09/13/2021)    ? ?No facility-administered medications prior to visit.  ? ? ? ? ?PHYSICAL EXAM ? ?GENERAL EXAM/CONSTITUTIONAL: ?Vitals:  ?Vitals:  ? 09/13/21 1430  ?BP: 130/80  ?Pulse: 89  ?Weight: 178 lb (80.7 kg)  ?Height: '5\' 8"'$  (1.727 m)  ? ?Body mass index is 27.06 kg/m?. ?Wt Readings from Last 3 Encounters:  ?09/13/21 178 lb (80.7 kg)  ?08/10/21 178 lb 9.2 oz (81 kg)  ?07/14/21 178 lb (80.7 kg)  ? ?Patient is in no distress; well developed, nourished and groomed; neck is supple ? ?CARDIOVASCULAR: ?Examination of carotid arteries is normal; no carotid bruits ?Regular rate and rhythm, no murmurs ?Examination of peripheral vascular system  by observation and palpation is normal ? ?EYES: ?Ophthalmoscopic exam of optic discs and posterior segments is normal; no papilledema or hemorrhages ?No results found. ? ?MUSCULOSKELETAL: ?Gait, strength, tone, movements noted in Neurologic exam below ? ?NEUROLOGIC: ?MENTAL STATUS:  ?MMSE - Mini Mental State Exam 12/21/2020 08/17/2020 08/18/2019  ?Not completed: - - (No Data)  ?Orientation to time '2 3 2  '$ ?Orientation to Place '4 4 3  '$ ?Registration '3 3 3  '$ ?Attention/ Calculation '1 5 5  '$ ?Recall 0 3 1  ?Language- name 2 objects '2 2 2  '$ ?Language- repeat '1 1 1  '$ ?  Language- follow 3 step command '3 3 3  '$ ?Language- read & follow direction '1 1 1  '$ ?Write a sentence '1 1 1  '$ ?Copy design '1 1 1  '$ ?Total score '19 27 23  '$ ? ?awake, alert, oriented to person, place and time ?recent and remote memory intact ?normal attention and concentration ?language fluent, comprehension intact, naming intact ?fund of knowledge appropriate ? ?CRANIAL NERVE:  ?2nd - no papilledema on fundoscopic exam ?2nd, 3rd, 4th, 6th - pupils equal and reactive to light, visual fields full to confrontation, extraocular muscles intact, no nystagmus ?5th - facial sensation symmetric ?7th - facial strength symmetric ?8th - hearing intact ?9th - palate elevates symmetrically, uvula midline ?11th - shoulder shrug symmetric ?12th - tongue protrusion midline ? ?MOTOR:  ?normal bulk and tone, full strength in the BUE, BLE ? ?SENSORY:  ?normal and symmetric to light touch ? ?COORDINATION:  ?finger-nose-finger, fine finger movements normal ? ?REFLEXES:  ?deep tendon reflexes TRACE and symmetric ? ?GAIT/STATION:  ?narrow based gait ? ? ? ? ?DIAGNOSTIC DATA (LABS, IMAGING, TESTING) ?- I reviewed patient records, labs, notes, testing and imaging myself where available. ? ?Lab Results  ?Component Value Date  ? WBC 10.0 03/17/2021  ? HGB 9.6 (L) 03/17/2021  ? HCT 27.5 (L) 03/17/2021  ? MCV 87.9 03/17/2021  ? PLT 221 03/17/2021  ? ?   ?Component Value Date/Time  ? NA 133 (L)  03/17/2021 0550  ? NA 132 (L) 01/19/2021 1129  ? NA 137 07/11/2016 0820  ? K 3.9 03/17/2021 1304  ? K 4.4 07/11/2016 0820  ? CL 99 03/17/2021 0550  ? CL 106 12/25/2012 0924  ? CO2 25 03/17/2021 0550  ? CO2 27 01/0

## 2021-09-13 NOTE — Patient Instructions (Signed)
SEIZURE DISORDER (last seizure 1970's) ?- seizure free x 50+ years! ?- consider to wean off anti-seizure meds in future ? ?MEMORY LOSS (mild short term; previous mild cognitive impairment) ?- could be related to longstanding fibromyalgia, depression, polypharmacy; also could be onset of mild dementia ?- continue memantine (started by Dr. Jannifer Franklin) ? ?ORTHOSTATIC HYPOTENSION ?- now improved since increasing salt intake and stopping BP meds ?

## 2021-10-10 ENCOUNTER — Ambulatory Visit (INDEPENDENT_AMBULATORY_CARE_PROVIDER_SITE_OTHER): Payer: Medicare Other

## 2021-10-10 DIAGNOSIS — I495 Sick sinus syndrome: Secondary | ICD-10-CM | POA: Diagnosis not present

## 2021-10-11 ENCOUNTER — Ambulatory Visit: Payer: Medicare Other | Admitting: Diagnostic Neuroimaging

## 2021-10-11 LAB — CUP PACEART REMOTE DEVICE CHECK
Battery Remaining Longevity: 57 mo
Battery Voltage: 2.99 V
Brady Statistic AP VP Percent: 0.01 %
Brady Statistic AP VS Percent: 0.61 %
Brady Statistic AS VP Percent: 0.05 %
Brady Statistic AS VS Percent: 99.34 %
Brady Statistic RA Percent Paced: 0.61 %
Brady Statistic RV Percent Paced: 0.05 %
Date Time Interrogation Session: 20230410164533
Implantable Lead Implant Date: 20160922
Implantable Lead Implant Date: 20160922
Implantable Lead Location: 753859
Implantable Lead Location: 753860
Implantable Lead Model: 5076
Implantable Lead Model: 5076
Implantable Pulse Generator Implant Date: 20160922
Lead Channel Impedance Value: 1406 Ohm
Lead Channel Impedance Value: 1406 Ohm
Lead Channel Impedance Value: 418 Ohm
Lead Channel Impedance Value: 475 Ohm
Lead Channel Pacing Threshold Amplitude: 0.875 V
Lead Channel Pacing Threshold Amplitude: 1.125 V
Lead Channel Pacing Threshold Pulse Width: 0.4 ms
Lead Channel Pacing Threshold Pulse Width: 0.4 ms
Lead Channel Sensing Intrinsic Amplitude: 3 mV
Lead Channel Sensing Intrinsic Amplitude: 3 mV
Lead Channel Sensing Intrinsic Amplitude: 7.625 mV
Lead Channel Sensing Intrinsic Amplitude: 7.625 mV
Lead Channel Setting Pacing Amplitude: 2.5 V
Lead Channel Setting Pacing Amplitude: 2.5 V
Lead Channel Setting Pacing Pulse Width: 0.4 ms
Lead Channel Setting Sensing Sensitivity: 2.8 mV

## 2021-10-15 NOTE — Progress Notes (Signed)
?  ?Cardiology Office Note ? ? ?Date:  10/20/2021  ? ?ID:  Rachel Clark, DOB 1950/10/15, MRN [id number] ? ?PCP:  Harlan Stains, MD  ?Cardiologist:   Marin Milley [name], MD  ? ?No chief complaint on file. ? ? ? ? ? ?  ?History of Present Illness: ?Rachel Clark is a 71 y.o. female is seen for follow up CAD. 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 Subsequent failure of SVG to RCA. S/p extensive stenting of RCA in the past- last intervention in 2017 with PTCA of ostial RCA for restenosis. In August 2019 stented RCA was widely patent.  ?  ?Since November 2021 she has had multiple episodes of dizziness and syncope. She was found to be orthostatic. Seen in our clinic and later by Dr Rayann Heman. Pacemaker function normal. Imdur was discontinued. Hydration seemed to improve symptoms. Echo was unremarkable.  ? ?Was seen in the ED on 09/04/20 with recurrent syncope after standing. She was orthostatic. Labs were unremarkable except mildly decreased sodium. No changes made at that time. Seen again in ED on May 11 with fall. Labs ok except low sodium level, pacer check and CXR Ok. Again hydrated. Her husband reports she passed out again on Father's day. Really doesn't have any warning. Is out only a few seconds. She has liberalized sodium intake. Wearing compression hose. Husband reports she naps 60% of the day. Seen recently by Dr Jannifer Franklin. No seizures noted. Husband has kept a BP record. Sometimes has a 20 point drop in BP with standing but at other times no change. On her last visit we stopped her amlodipine.  ? ?She was admitted 03/08/21 with a NSTEMI. Ecg showed diffuse ST depression. Troponin peaked at 39. She underwent repeat cardiac cath showing patent LIMA to the LAD and patent stents in the RCA. There was new disease in a large ramus branch with sequential high grade stenoses. She  had PCI which was difficult due to tortuosity and calcification of the vessel making deliverablility of stents difficult. The disease was successfully covered with 3 DES. She was placed on Effient due to interaction of Brilinta with Tegretol. DC the following day. She has a history of rash on Plavix. Indefinite DAPT recommended due to extensive stents. Hgb at time of DC was 12.1.  ? ?She was readmitted on 03/13/21 with acute upper GI bleed with coffee ground emesis. Hgb dropped to 7.7 then to 6.5. she was transfused multiple units of PRBCs. DAPT was continued given recent stents. EGD showed a visible vessel in the gastric antrum treated with epinephrine injection and clipping. Treated with PPI. Bleeding scan was subsequently negative. Hgb improved to 9.6 at discharge.  ? ?She was seen in EP clinic in January. No significant arrhythmia noted and pacer OK. ? ?On follow up she is seen with her husband. She is doing OK.  No evidence of recurrent bleeding. She has no recurrent chest pain. She does have some days where she is lightheaded and weak with orthostatic symptoms. Still wears compression hose. Stays hydrated and is liberal with salt intake. She has been on Nexlizet since last June and tolerating well. She is due for screening colonoscopy but we have recommended waiting until September to interrupt DAPT.  ? ?Past Medical History:  ?Diagnosis Date  ? Arthritis   ? "fingers" (06/29/2016)  ? Chronic lower back pain   ? Colon  cancer ([place]) 12/04/2011  ? s/p Laparoscopic-assisted transverse colectomy on 12/19/2011 by Dr. Donne Hazel.  pT3 N0 M0.   ? Coronary artery disease   ? a. remote NSTEMI with PCI to the RCA; b. S/P CABG in 2007; c. 2010 - 2 v CAD with L-LAD and S-RCA patent, small caliber Dx 70% (treated medically - no amenable to PCI), CFX free of significant disease, normal LVF. d. 10/2014 Lexi MV: EF 61%, no ischemia/infarct.  ? Depression   ? Dyslipidemia   ? Fibromyalgia   ? Gastric ulcer   ? GERD  (gastroesophageal reflux disease)   ? 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 last seizure was in 1972 ;takes Tegretol (06/29/2016)   ? Headache   ? "weekly" (06/29/2016)  ? Hyperlipidemia   ? Hypothyroid   ? Iron deficiency anemia 11/17/2011  ? Memory change 11/13/2016  ? Monoclonal gammopathy of unknown significance   ? Orthostatic hypotension 12/21/2020  ? Presence of permanent cardiac pacemaker   ? Syncope and collapse   ? pacemaker implanted  ? ? ?Past Surgical History:  ?Procedure Laterality Date  ? ANTERIOR CERVICAL DECOMP/DISCECTOMY FUSION  1980's  ? BACK SURGERY    ? CARDIAC CATHETERIZATION  05/20/2009  ? obstructive native vessel disease in LAD, RCA, and first diagonal, patent vein graft to distal RCA and LIMA to LAD,normal. ef 60%  ? CARDIAC CATHETERIZATION N/A 03/24/2015  ? Procedure: Left Heart Cath and Cors/Grafts Angiography;  Surgeon: Wellington Hampshire, MD;  Location: [facility];  Service: Cardiovascular;  Laterality: N/A;  ? CARDIAC CATHETERIZATION N/A 07/28/2015  ? Procedure: Left Heart Cath and Coronary Angiography;  Surgeon: Troy Sine, MD;  Location: [facility];  Service: Cardiovascular;  Laterality: N/A;  ? CARDIAC CATHETERIZATION N/A 11/09/2015  ? Procedure: Coronary Stent Intervention;  Surgeon: Sherren Mocha, MD;  Location: [facility];  Service: Cardiovascular;  Laterality: N/A;  ? CARDIAC CATHETERIZATION N/A 11/09/2015  ? Procedure: Left Heart Cath and Coronary Angiography;  Surgeon: Sherren Mocha, MD;  Location: [facility];  Service: Cardiovascular;  Laterality: N/A;  ? CARDIAC CATHETERIZATION N/A 06/29/2016  ? Procedure: Left Heart Cath and Coronary Angiography;  Surgeon: Nelva Bush, MD;  Location: [facility];  Service: Cardiovascular;  Laterality: N/A;  ? CARDIAC CATHETERIZATION N/A 06/29/2016  ? Procedure: Intravascular Pressure Wire/FFR Study;  Surgeon: Nelva Bush, MD;  Location: [facility];  Service: Cardiovascular;   Laterality: N/A;  ? CARDIAC CATHETERIZATION N/A 06/29/2016  ? Procedure: Coronary Balloon Angioplasty;  Surgeon: Nelva Bush, MD;  Location: [facility];  Service: Cardiovascular;  Laterality: N/A;  ? COLON RESECTION  12/19/2011  ? Procedure: COLON RESECTION LAPAROSCOPIC;  Surgeon: Rolm Bookbinder, MD;  Location: [facility];  Service: General;  Laterality: N/A;  laparoscopic hand assisted partial colon resection  ? COLON SURGERY    ? COLONOSCOPY    ? CORONARY ANGIOPLASTY WITH STENT PLACEMENT  ~ 2007  ? 1 stent  ? CORONARY ARTERY BYPASS GRAFT  2007  ? "CABG X2"  ? [place]  ? EP IMPLANTABLE DEVICE N/A 03/25/2015  ? MDT Advisa DR pacemaker implanted by Dr Rayann Heman for transient complete heart block and syncope  ? ESOPHAGOGASTRODUODENOSCOPY (EGD) WITH PROPOFOL N/A 03/14/2021  ? Procedure: ESOPHAGOGASTRODUODENOSCOPY (EGD) WITH PROPOFOL;  Surgeon: Ronnette Juniper, MD;  Location: [facility];  Service: Gastroenterology;  Laterality: N/A;  ? HEMOSTASIS CLIP PLACEMENT  03/14/2021  ? Procedure: HEMOSTASIS CLIP PLACEMENT;  Surgeon: Ronnette Juniper, MD;  Location: [facility];  Service: Gastroenterology;;  ? INSERT / REPLACE / REMOVE PACEMAKER    ? LEFT HEART CATH AND CORS/GRAFTS ANGIOGRAPHY N/A 02/21/2018  ? Procedure: LEFT HEART CATH AND CORS/GRAFTS ANGIOGRAPHY;  Surgeon: Lorretta Harp, MD;  Location: [facility];  Service: Cardiovascular;  Laterality: N/A;  ? LEFT HEART CATH AND CORS/GRAFTS ANGIOGRAPHY N/A 03/08/2021  ? Procedure: LEFT HEART CATH AND CORS/GRAFTS ANGIOGRAPHY;  Surgeon: [name], Clemma Johnsen M, MD;  Location: [facility];  Service: Cardiovascular;  Laterality: N/A;  ? PORT-A-CATH REMOVAL N/A 06/12/2013  ? Procedure: REMOVAL PORT-A-CATH;  Surgeon: Rolm Bookbinder, MD;  Location: [facility];  Service: General;  Laterality: N/A;  ? PORTACATH PLACEMENT  06/04/2012  ? Procedure: INSERTION PORT-A-CATH;  Surgeon: Rolm Bookbinder, MD;  Location: [facility];  Service:  General;  Laterality: N/A;  Insertion of port-a-cath   ? POSTERIOR LAMINECTOMY / DECOMPRESSION CERVICAL SPINE  1990s  ? SCLEROTHERAPY  03/14/2021  ? Procedure: SCLEROTHERAPY;  Surgeon: Ronnette Juniper, MD;  Location: [facility]

## 2021-10-17 ENCOUNTER — Telehealth: Payer: Self-pay | Admitting: Cardiology

## 2021-10-17 DIAGNOSIS — E785 Hyperlipidemia, unspecified: Secondary | ICD-10-CM

## 2021-10-17 DIAGNOSIS — I2510 Atherosclerotic heart disease of native coronary artery without angina pectoris: Secondary | ICD-10-CM

## 2021-10-17 DIAGNOSIS — I10 Essential (primary) hypertension: Secondary | ICD-10-CM

## 2021-10-17 NOTE — Telephone Encounter (Signed)
New Message: ? ? ? ?Does pt need lab work before her appointment on 10-20-21 or lab work when she comes in? ?

## 2021-10-17 NOTE — Telephone Encounter (Signed)
Returned call to patient left message on personal voice mail she does need labs.Advised she does not need a lab appointment.Orders placed. ?

## 2021-10-19 ENCOUNTER — Other Ambulatory Visit: Payer: Self-pay

## 2021-10-19 DIAGNOSIS — E785 Hyperlipidemia, unspecified: Secondary | ICD-10-CM | POA: Diagnosis not present

## 2021-10-19 DIAGNOSIS — I10 Essential (primary) hypertension: Secondary | ICD-10-CM | POA: Diagnosis not present

## 2021-10-19 DIAGNOSIS — I2510 Atherosclerotic heart disease of native coronary artery without angina pectoris: Secondary | ICD-10-CM | POA: Diagnosis not present

## 2021-10-19 DIAGNOSIS — D649 Anemia, unspecified: Secondary | ICD-10-CM

## 2021-10-19 LAB — LIPID PANEL
Chol/HDL Ratio: 2.7 ratio (ref 0.0–4.4)
Cholesterol, Total: 236 mg/dL — ABNORMAL HIGH (ref 100–199)
HDL: 87 mg/dL (ref >39)
LDL Chol Calc (NIH): 126 mg/dL — ABNORMAL HIGH (ref 0–99)
Triglycerides: 135 mg/dL (ref 0–149)
VLDL Cholesterol Cal: 23 mg/dL (ref 5–40)

## 2021-10-19 LAB — BASIC METABOLIC PANEL
BUN/Creatinine Ratio: 14 (ref 12–28)
BUN: 12 mg/dL (ref 8–27)
CO2: 26 mmol/L (ref 20–29)
Calcium: 10.2 mg/dL (ref 8.7–10.3)
Chloride: 97 mmol/L (ref 96–106)
Creatinine, Ser: 0.85 mg/dL (ref 0.57–1.00)
Glucose: 121 mg/dL — ABNORMAL HIGH (ref 70–99)
Potassium: 5 mmol/L (ref 3.5–5.2)
Sodium: 135 mmol/L (ref 134–144)
eGFR: 73 mL/min/{1.73_m2} (ref >59)

## 2021-10-19 LAB — CBC WITH DIFFERENTIAL/PLATELET
Basophils Absolute: 0 10*3/uL (ref 0.0–0.2)
Basos: 0 %
EOS (ABSOLUTE): 0.1 10*3/uL (ref 0.0–0.4)
Eos: 1 %
Hematocrit: 41.3 % (ref 34.0–46.6)
Hemoglobin: 13.8 g/dL (ref 11.1–15.9)
Immature Grans (Abs): 0 10*3/uL (ref 0.0–0.1)
Immature Granulocytes: 0 %
Lymphocytes Absolute: 1.8 10*3/uL (ref 0.7–3.1)
Lymphs: 26 %
MCH: 28 pg (ref 26.6–33.0)
MCHC: 33.4 g/dL (ref 31.5–35.7)
MCV: 84 fL (ref 79–97)
Monocytes Absolute: 0.5 10*3/uL (ref 0.1–0.9)
Monocytes: 8 %
Neutrophils Absolute: 4.5 10*3/uL (ref 1.4–7.0)
Neutrophils: 65 %
Platelets: 393 10*3/uL (ref 150–450)
RBC: 4.93 x10E6/uL (ref 3.77–5.28)
RDW: 11.9 % (ref 11.7–15.4)
WBC: 6.9 10*3/uL (ref 3.4–10.8)

## 2021-10-19 LAB — HEPATIC FUNCTION PANEL
ALT: 8 IU/L (ref 0–32)
AST: 10 IU/L (ref 0–40)
Albumin: 4.6 g/dL (ref 3.7–4.7)
Alkaline Phosphatase: 76 IU/L (ref 44–121)
Bilirubin Total: 0.3 mg/dL (ref 0.0–1.2)
Bilirubin, Direct: 0.1 mg/dL (ref 0.00–0.40)
Total Protein: 7.7 g/dL (ref 6.0–8.5)

## 2021-10-20 ENCOUNTER — Ambulatory Visit (INDEPENDENT_AMBULATORY_CARE_PROVIDER_SITE_OTHER): Payer: Medicare Other | Admitting: Cardiology

## 2021-10-20 ENCOUNTER — Encounter: Payer: Self-pay | Admitting: Cardiology

## 2021-10-20 VITALS — BP 146/84 | HR 88 | Ht 68.0 in | Wt 179.0 lb

## 2021-10-20 DIAGNOSIS — I495 Sick sinus syndrome: Secondary | ICD-10-CM

## 2021-10-20 DIAGNOSIS — Z950 Presence of cardiac pacemaker: Secondary | ICD-10-CM | POA: Diagnosis not present

## 2021-10-20 DIAGNOSIS — I951 Orthostatic hypotension: Secondary | ICD-10-CM

## 2021-10-20 DIAGNOSIS — I2510 Atherosclerotic heart disease of native coronary artery without angina pectoris: Secondary | ICD-10-CM

## 2021-10-20 DIAGNOSIS — E785 Hyperlipidemia, unspecified: Secondary | ICD-10-CM

## 2021-10-20 NOTE — Patient Instructions (Signed)
Medication Instructions:  ?Continue same medications ?*If you need a refill on your cardiac medications before your next appointment, please call your pharmacy* ? ? ?Lab Work: ?None ordered ? ? ?Testing/Procedures: ?None ordered ? ? ?Follow-Up: ?At [facility], you and your health needs are our priority.  As part of our continuing mission to provide you with exceptional heart care, we have created designated Provider Care Teams.  These Care Teams include your primary Cardiologist (physician) and Advanced Practice Providers (APPs -  Physician Assistants and Nurse Practitioners) who all work together to provide you with the care you need, when you need it. ? ?We recommend signing up for the patient portal called "MyChart".  Sign up information is provided on this After Visit Summary.  MyChart is used to connect with patients for Virtual Visits (Telemedicine).  Patients are able to view lab/test results, encounter notes, upcoming appointments, etc.  Non-urgent messages can be sent to your provider as well.   ?To learn more about what you can do with MyChart, go to [email or web address].   ? ?Your next appointment:  6 months ?  ? ?The format for your next appointment: Office ? ? ?Provider:  Dr.Jordan ?  :1}  ? ? ?Schedule appointment with Pharmacist in [facility]   ? ?Important Information About Sugar ? ? ? ? ? ? ?

## 2021-10-27 NOTE — Progress Notes (Signed)
Remote pacemaker transmission.   

## 2021-11-07 ENCOUNTER — Ambulatory Visit (INDEPENDENT_AMBULATORY_CARE_PROVIDER_SITE_OTHER): Payer: Medicare Other | Admitting: Pharmacist Clinician (PhC)/ Clinical Pharmacy Specialist

## 2021-11-07 VITALS — BP 134/82 | HR 52 | Resp 18 | Ht 67.0 in | Wt 179.0 lb

## 2021-11-07 DIAGNOSIS — E785 Hyperlipidemia, unspecified: Secondary | ICD-10-CM

## 2021-11-07 DIAGNOSIS — I209 Angina pectoris, unspecified: Secondary | ICD-10-CM

## 2021-11-07 DIAGNOSIS — I200 Unstable angina: Secondary | ICD-10-CM | POA: Diagnosis not present

## 2021-11-07 MED ORDER — REPATHA PUSHTRONEX SYSTEM 420 MG/3.5ML ~~LOC~~ SOCT: 420.0000 mg | SUBCUTANEOUS | 11 refills | Status: DC

## 2021-11-07 NOTE — Progress Notes (Signed)
11/07/2021 ?Paschal Dopp Cabler ?June 20, 1951 ?[id number] ? ? ?HPI:  Rachel Clark is a 71 y.o. female patient of Dr [name], who presents today for a lipid clinic evaluation.  See pertinent past medical history below.  Patient has extensive history of ASCVD and has been unable to tolerate high intensity statin drugs.  I saw her almost a year ago and reviewed options for cholesterol lowering.  At that time she chose to add bempedoic acid to her ezetimibe.  Unfortunately this has dropped her LDL only to 126. Since that visit she was hospitalized with another NSTEMI in September 2022  Her LIMA to LAD as well as RCA stents were all patent, she had new disease in large ramus branch with sequential high grade stenoses.  Three DES were place and she was started on Effient (secondary to Brilinta/Tegretol drug interaction).  Today she is in the office with her husband to discuss further options for cholesterol lowering.    ? ?ID [id number] ?BIN [id number] ?PCN [facility] ?GRP [id number] ? ?Past Medical History: ?ASCVD S/P CABG x 2 -  2007 with subsequent failure of SVG to RCA; extensive stenting of RCA with most recent intervention in 2017 with PTCA of ostial RCA for restenosis  ?Orthostatic hypotension Has occasional 20 pt drop in BP with positional change, several syncopal epidsodes  ?Seizure disorder On ethosuximide, carbamazepine  ?Memory loss On memantine  ? ? ?Current Medications: ezetimibe 10 mg qd, bempedoic acid 180 mg  - Nexlizet tablet ? ?Cholesterol Goals: LDL < 70 ?  ?Intolerant/previously tried: rosuvastatin, atorvastatin both caused myalgias ? ?Family history: father died in is early 17's from MI, both his brothers with MI's although they lived longer lives; mother died in her early 27's - smoker, emphysema; 1 sister died from dementia, one half brother (mother) had MI in his late 78's (smoker, alcohol, obesity) now in his 11's; 1 daughter good health, (45) 1 son with epilepsy and defibrillator (69) ? ?Diet: does add  salt due to orthostatic hypotension, otherwise eats good mix of meats and vegetables ? ?Exercise:  no regular exercise, concern to be out walking d/t syncopal issues ? ?Labs:  6/22:  TC 261, TG 141, HDL 86, LDL 151 ? 4/23:  TC 236, TG 135, HDL 87, LDL 126 ? ?Current Outpatient Medications  ?Medication Sig Dispense Refill  ? acetaminophen (TYLENOL) 500 MG tablet Take 500-1,000 mg by mouth daily as needed for mild pain or headache.    ? aspirin 81 MG chewable tablet Chew 1 tablet (81 mg total) by mouth daily.    ? Azelastine HCl 0.15 % SOLN Place 1 spray into both nostrils daily as needed (seasonal allergies).  12  ? Bempedoic Acid-Ezetimibe (NEXLIZET) 180-10 MG TABS Take 180 mg by mouth daily. 90 tablet 3  ? calcium carbonate (TUMS - DOSED IN MG ELEMENTAL CALCIUM) 500 MG chewable tablet Chew 500 mg by mouth daily.    ? carbamazepine (TEGRETOL XR) 200 MG 12 hr tablet Take 3 tablets (600 mg total) by mouth 2 (two) times daily. 540 tablet 4  ? Cholecalciferol (VITAMIN D) 50 MCG (2000 UT) CAPS Take 2,000 Units by mouth daily.    ? DULoxetine (CYMBALTA) 60 MG capsule Take 60 mg by mouth 2 (two) times daily.    ? ethosuximide (ZARONTIN) 250 MG capsule Take 2 capsules (500 mg total) by mouth 2 (two) times daily. 360 capsule 4  ? Evolocumab with Infusor ([place]) 420 MG/3.5ML SOCT Inject 420 mg into the skin every 30 (  thirty) days. 3.6 mL 11  ? levothyroxine (SYNTHROID, LEVOTHROID) 50 MCG tablet Take 50 mcg by mouth daily before breakfast.    ? memantine (NAMENDA) 10 MG tablet Take 1 tablet (10 mg total) by mouth 2 (two) times daily. 180 tablet 4  ? nitroGLYCERIN (NITROSTAT) 0.4 MG SL tablet Place 1 tablet (0.4 mg total) under the tongue every 5 (five) minutes as needed. For chest pain. 25 tablet 11  ? prasugrel (EFFIENT) 10 MG TABS tablet Take 1 tablet (10 mg total) by mouth daily. 90 tablet 3  ? pantoprazole (PROTONIX) 40 MG tablet Take 1 tablet (40 mg total) by mouth 2 (two) times daily. AFTER 2 MONTHS  CONT AS ONCE A DAY (Patient taking differently: Take 40 mg by mouth daily.) 60 tablet 1  ? ?No current facility-administered medications for this visit.  ? ? ?Allergies  ?Allergen Reactions  ? Sulfamethoxazole Anaphylaxis  ?  Don't recall  ? Aspirin Other (See Comments)  ?  GI upset- can tolerate 81 mg ASA, just not full doses  ? Crestor [Rosuvastatin]   ?  myalgias  ? Erythromycin Nausea Only  ?  Gi upset  ? Lisinopril Cough  ? Niacin And Related Other (See Comments)  ?  Don't recall  ? Penicillin G Benzathine   ?  Other reaction(s): Unknown  ? Statins Other (See Comments)  ?  Muscle pain  ? Sulfa Drugs Cross Reactors Swelling  ?  Don't recall  ? Tetracyclines & Related Nausea Only  ? Xeloda [Capecitabine] Diarrhea  ? Penicillins Nausea Only and Rash  ?   ?  ? Plavix [Clopidogrel Bisulfate] Rash  ? ? ?Past Medical History:  ?Diagnosis Date  ? Arthritis   ? "fingers" (06/29/2016)  ? Chronic lower back pain   ? Colon cancer ([place]) 12/04/2011  ? s/p Laparoscopic-assisted transverse colectomy on 12/19/2011 by Dr. Donne Hazel.  pT3 N0 M0.   ? Coronary artery disease   ? a. remote NSTEMI with PCI to the RCA; b. S/P CABG in 2007; c. 2010 - 2 v CAD with L-LAD and S-RCA patent, small caliber Dx 70% (treated medically - no amenable to PCI), CFX free of significant disease, normal LVF. d. 10/2014 Lexi MV: EF 61%, no ischemia/infarct.  ? Depression   ? Dyslipidemia   ? Fibromyalgia   ? Gastric ulcer   ? GERD (gastroesophageal reflux disease)   ? 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 last seizure was in 1972 ;takes Tegretol (06/29/2016)   ? Headache   ? "weekly" (06/29/2016)  ? Hyperlipidemia   ? Hypothyroid   ? Iron deficiency anemia 11/17/2011  ? Memory change 11/13/2016  ? Monoclonal gammopathy of unknown significance   ? Orthostatic hypotension 12/21/2020  ? Presence of permanent cardiac pacemaker   ? Syncope and collapse   ? pacemaker implanted  ? ? ?Blood pressure 134/82, pulse (!) 52, resp. rate 18, height '5\' 7"'$   (1.702 m), weight 179 lb (81.2 kg), SpO2 98 %. ? ? ?Dyslipidemia ?Patient with hyperlipidemia and LDL not at goal on Nexlizet.  Reviewed options for lowering LDL cholesterol, including PCSK-9 inhibitors and inclisiran.  Discussed mechanisms of action, dosing, side effects and potential decreases in LDL cholesterol.  Also reviewed cost information and potential options for patient assistance.  Answered all patient questions.  Based on this information, patient would prefer to start  [place] Pushtronix.  We will start paperwork to get covered by her insurance.  She will need to use 1 Pushtronix every month  and repeat labs after 3 doses.   ? ? ? ?Tommy Medal PharmD CPP [facility] ?[place] [address] ?[place], [place] [address] ?[phone] ? ? ? ? ?

## 2021-11-07 NOTE — Patient Instructions (Signed)
Your Results: ?           ? Your most recent labs Goal  ?Total Cholesterol 236 < 200  ?Triglycerides 135 < 150  ?HDL (happy/good cholesterol) 87 > 40  ?LDL (lousy/bad cholesterol 126 < 55  ? ?Medication changes: ? We will start the process to get [place] Pushtronix covered by your insurance.  Once the prior authorization is complete, [place] Silos will call you to let you know and confirm pharmacy information.   You will use 1 Pushtronix every month.   ? ?Lab orders: ? We want to repeat labs after 2-3 months.  We will send you a lab order to remind you once we get closer to that time.   ? ?Patient Assistance:  The [employer] foundation offers assistance to help pay for medication copays.  They will cover copays for all cholesterol lowering meds, including statins, fibrates, omega-3 oils, ezetimibe, Repatha, Praluent, Nexletol, Nexlizet.  The cards are usually good for $2,500 or 12 months, whichever comes first. ?Go to healthwellfoundation.org ?Click on ?Apply Now? ?Answer questions as to whom is applying (patient or representative) ?Your disease fund will be ?hypercholesterolemia - Medicare access? ?They will ask questions about finances and which medications you are taking for cholesterol ?When you submit, the approval is usually within minutes.  You will need to print the card information from the site ?You will need to show this information to your pharmacy, they will bill your Medicare Part D plan first -then bill [employer] --for the copay.   ?You can also call them at [phone], although the hold times can be quite long.  ? ?Thank you for choosing [facility]  ? ?

## 2021-11-07 NOTE — Assessment & Plan Note (Signed)
Patient with hyperlipidemia and LDL not at goal on Nexlizet.  Reviewed options for lowering LDL cholesterol, including PCSK-9 inhibitors and inclisiran.  Discussed mechanisms of action, dosing, side effects and potential decreases in LDL cholesterol.  Also reviewed cost information and potential options for patient assistance.  Answered all patient questions.  Based on this information, patient would prefer to start  [place] Pushtronix.  We will start paperwork to get covered by her insurance.  She will need to use 1 Pushtronix every month and repeat labs after 3 doses.   ?

## 2021-11-09 ENCOUNTER — Emergency Department (HOSPITAL_COMMUNITY): Payer: Medicare Other

## 2021-11-09 ENCOUNTER — Observation Stay (HOSPITAL_BASED_OUTPATIENT_CLINIC_OR_DEPARTMENT_OTHER)
Admission: EM | Admit: 2021-11-09 | Discharge: 2021-11-10 | Disposition: A | Discharge status: Home / Self Care | Payer: Medicare Other | Source: Home / Self Care | Attending: Emergency Medicine | Admitting: Emergency Medicine

## 2021-11-09 ENCOUNTER — Encounter (HOSPITAL_COMMUNITY): Payer: Self-pay | Admitting: Emergency Medicine

## 2021-11-09 ENCOUNTER — Other Ambulatory Visit: Payer: Self-pay

## 2021-11-09 DIAGNOSIS — Z7982 Long term (current) use of aspirin: Secondary | ICD-10-CM | POA: Insufficient documentation

## 2021-11-09 DIAGNOSIS — K259 Gastric ulcer, unspecified as acute or chronic, without hemorrhage or perforation: Secondary | ICD-10-CM | POA: Diagnosis not present

## 2021-11-09 DIAGNOSIS — R079 Chest pain, unspecified: Secondary | ICD-10-CM | POA: Diagnosis not present

## 2021-11-09 DIAGNOSIS — I252 Old myocardial infarction: Secondary | ICD-10-CM | POA: Diagnosis not present

## 2021-11-09 DIAGNOSIS — Z950 Presence of cardiac pacemaker: Secondary | ICD-10-CM | POA: Diagnosis not present

## 2021-11-09 DIAGNOSIS — I25118 Atherosclerotic heart disease of native coronary artery with other forms of angina pectoris: Secondary | ICD-10-CM | POA: Diagnosis not present

## 2021-11-09 DIAGNOSIS — E785 Hyperlipidemia, unspecified: Secondary | ICD-10-CM | POA: Diagnosis present

## 2021-11-09 DIAGNOSIS — I214 Non-ST elevation (NSTEMI) myocardial infarction: Secondary | ICD-10-CM | POA: Diagnosis not present

## 2021-11-09 DIAGNOSIS — Z981 Arthrodesis status: Secondary | ICD-10-CM | POA: Diagnosis not present

## 2021-11-09 DIAGNOSIS — I495 Sick sinus syndrome: Secondary | ICD-10-CM | POA: Diagnosis not present

## 2021-11-09 DIAGNOSIS — K254 Chronic or unspecified gastric ulcer with hemorrhage: Secondary | ICD-10-CM | POA: Diagnosis not present

## 2021-11-09 DIAGNOSIS — Y831 Surgical operation with implant of artificial internal device as the cause of abnormal reaction of the patient, or of later complication, without mention of misadventure at the time of the procedure: Secondary | ICD-10-CM | POA: Diagnosis present

## 2021-11-09 DIAGNOSIS — R413 Other amnesia: Secondary | ICD-10-CM | POA: Diagnosis present

## 2021-11-09 DIAGNOSIS — D500 Iron deficiency anemia secondary to blood loss (chronic): Secondary | ICD-10-CM | POA: Diagnosis not present

## 2021-11-09 DIAGNOSIS — E039 Hypothyroidism, unspecified: Secondary | ICD-10-CM | POA: Diagnosis not present

## 2021-11-09 DIAGNOSIS — I25110 Atherosclerotic heart disease of native coronary artery with unstable angina pectoris: Secondary | ICD-10-CM

## 2021-11-09 DIAGNOSIS — K219 Gastro-esophageal reflux disease without esophagitis: Secondary | ICD-10-CM | POA: Diagnosis present

## 2021-11-09 DIAGNOSIS — T82855A Stenosis of coronary artery stent, initial encounter: Secondary | ICD-10-CM | POA: Diagnosis not present

## 2021-11-09 DIAGNOSIS — Z951 Presence of aortocoronary bypass graft: Secondary | ICD-10-CM | POA: Insufficient documentation

## 2021-11-09 DIAGNOSIS — D472 Monoclonal gammopathy: Secondary | ICD-10-CM | POA: Diagnosis not present

## 2021-11-09 DIAGNOSIS — K2900 Acute gastritis without bleeding: Secondary | ICD-10-CM | POA: Diagnosis not present

## 2021-11-09 DIAGNOSIS — I25119 Atherosclerotic heart disease of native coronary artery with unspecified angina pectoris: Secondary | ICD-10-CM | POA: Diagnosis not present

## 2021-11-09 DIAGNOSIS — G40409 Other generalized epilepsy and epileptic syndromes, not intractable, without status epilepticus: Secondary | ICD-10-CM | POA: Diagnosis not present

## 2021-11-09 DIAGNOSIS — I10 Essential (primary) hypertension: Secondary | ICD-10-CM | POA: Diagnosis not present

## 2021-11-09 DIAGNOSIS — D62 Acute posthemorrhagic anemia: Secondary | ICD-10-CM | POA: Diagnosis not present

## 2021-11-09 DIAGNOSIS — R0789 Other chest pain: Secondary | ICD-10-CM | POA: Diagnosis not present

## 2021-11-09 DIAGNOSIS — E871 Hypo-osmolality and hyponatremia: Secondary | ICD-10-CM | POA: Diagnosis not present

## 2021-11-09 DIAGNOSIS — I2510 Atherosclerotic heart disease of native coronary artery without angina pectoris: Secondary | ICD-10-CM

## 2021-11-09 DIAGNOSIS — F32A Depression, unspecified: Secondary | ICD-10-CM | POA: Diagnosis not present

## 2021-11-09 DIAGNOSIS — M797 Fibromyalgia: Secondary | ICD-10-CM | POA: Diagnosis present

## 2021-11-09 DIAGNOSIS — G8929 Other chronic pain: Secondary | ICD-10-CM | POA: Diagnosis present

## 2021-11-09 DIAGNOSIS — Z85038 Personal history of other malignant neoplasm of large intestine: Secondary | ICD-10-CM | POA: Insufficient documentation

## 2021-11-09 DIAGNOSIS — K2970 Gastritis, unspecified, without bleeding: Secondary | ICD-10-CM | POA: Diagnosis not present

## 2021-11-09 DIAGNOSIS — Z7902 Long term (current) use of antithrombotics/antiplatelets: Secondary | ICD-10-CM | POA: Diagnosis not present

## 2021-11-09 DIAGNOSIS — D509 Iron deficiency anemia, unspecified: Secondary | ICD-10-CM | POA: Diagnosis not present

## 2021-11-09 DIAGNOSIS — K922 Gastrointestinal hemorrhage, unspecified: Secondary | ICD-10-CM | POA: Diagnosis not present

## 2021-11-09 DIAGNOSIS — Z87891 Personal history of nicotine dependence: Secondary | ICD-10-CM | POA: Insufficient documentation

## 2021-11-09 DIAGNOSIS — Z66 Do not resuscitate: Secondary | ICD-10-CM | POA: Diagnosis not present

## 2021-11-09 HISTORY — DX: Gastrointestinal hemorrhage, unspecified: K92.2

## 2021-11-09 HISTORY — DX: Hypo-osmolality and hyponatremia: E87.1

## 2021-11-09 HISTORY — DX: Atrioventricular block, complete: I44.2

## 2021-11-09 LAB — BASIC METABOLIC PANEL
Anion gap: 6 (ref 5–15)
BUN: 19 mg/dL (ref 8–23)
CO2: 24 mmol/L (ref 22–32)
Calcium: 8.5 mg/dL — ABNORMAL LOW (ref 8.9–10.3)
Chloride: 101 mmol/L (ref 98–111)
Creatinine, Ser: 0.73 mg/dL (ref 0.44–1.00)
GFR, Estimated: >60 mL/min (ref >60)
Glucose, Bld: 111 mg/dL — ABNORMAL HIGH (ref 70–99)
Potassium: 4.6 mmol/L (ref 3.5–5.1)
Sodium: 131 mmol/L — ABNORMAL LOW (ref 135–145)

## 2021-11-09 LAB — TROPONIN I (HIGH SENSITIVITY)
Troponin I (High Sensitivity): 9 ng/L (ref <18)
Troponin I (High Sensitivity): 23 ng/L — ABNORMAL HIGH (ref <18)
Troponin I (High Sensitivity): 41 ng/L — ABNORMAL HIGH (ref <18)

## 2021-11-09 LAB — CBC
HCT: 35.1 % — ABNORMAL LOW (ref 36.0–46.0)
Hemoglobin: 11.5 g/dL — ABNORMAL LOW (ref 12.0–15.0)
MCH: 28.3 pg (ref 26.0–34.0)
MCHC: 32.8 g/dL (ref 30.0–36.0)
MCV: 86.2 fL (ref 80.0–100.0)
Platelets: 333 10*3/uL (ref 150–400)
RBC: 4.07 MIL/uL (ref 3.87–5.11)
RDW: 13.2 % (ref 11.5–15.5)
WBC: 7.6 10*3/uL (ref 4.0–10.5)
nRBC: 0 % (ref 0.0–0.2)

## 2021-11-09 MED ORDER — VITAMIN D 25 MCG (1000 UNIT) PO TABS
2000.0000 [IU] | ORAL_TABLET | Freq: Every day | ORAL | Status: DC
Start: 2021-11-10 — End: 2021-11-10
  Administered 2021-11-10: 2000 [IU] via ORAL
  Filled 2021-11-09: qty 2

## 2021-11-09 MED ORDER — SODIUM CHLORIDE 0.9% FLUSH: 3.0000 mL | INTRAVENOUS | Status: DC | PRN

## 2021-11-09 MED ORDER — PRASUGREL HCL 10 MG PO TABS
10.0000 mg | ORAL_TABLET | Freq: Every day | ORAL | Status: DC
Start: 2021-11-10 — End: 2021-11-10
  Administered 2021-11-10: 10 mg via ORAL
  Filled 2021-11-09: qty 1

## 2021-11-09 MED ORDER — SODIUM CHLORIDE 0.9% FLUSH
3.0000 mL | Freq: Two times a day (BID) | INTRAVENOUS | Status: DC
Start: 2021-11-09 — End: 2021-11-10
  Administered 2021-11-09 – 2021-11-10 (×2): 3 mL via INTRAVENOUS

## 2021-11-09 MED ORDER — DULOXETINE HCL 60 MG PO CPEP
60.0000 mg | ORAL_CAPSULE | Freq: Two times a day (BID) | ORAL | Status: DC
Start: 2021-11-09 — End: 2021-11-10
  Administered 2021-11-09 – 2021-11-10 (×2): 60 mg via ORAL
  Filled 2021-11-09 (×2): qty 1

## 2021-11-09 MED ORDER — SODIUM CHLORIDE 0.9 % IV SOLN: 250.0000 mL | INTRAVENOUS | Status: DC | PRN

## 2021-11-09 MED ORDER — ISOSORBIDE MONONITRATE ER 30 MG PO TB24
15.0000 mg | ORAL_TABLET | Freq: Every day | ORAL | Status: DC
Start: 2021-11-09 — End: 2021-11-10
  Administered 2021-11-09 – 2021-11-10 (×2): 15 mg via ORAL
  Filled 2021-11-09 (×2): qty 1

## 2021-11-09 MED ORDER — LEVOTHYROXINE SODIUM 50 MCG PO TABS
50.0000 ug | ORAL_TABLET | Freq: Every day | ORAL | Status: DC
  Administered 2021-11-10: 50 ug via ORAL
  Filled 2021-11-09: qty 1

## 2021-11-09 MED ORDER — PANTOPRAZOLE SODIUM 40 MG PO TBEC
40.0000 mg | DELAYED_RELEASE_TABLET | Freq: Two times a day (BID) | ORAL | Status: DC
  Administered 2021-11-09 – 2021-11-10 (×2): 40 mg via ORAL
  Filled 2021-11-09 (×2): qty 1

## 2021-11-09 MED ORDER — CARBAMAZEPINE ER 200 MG PO TB12
600.0000 mg | ORAL_TABLET | Freq: Two times a day (BID) | ORAL | Status: DC
  Administered 2021-11-09 – 2021-11-10 (×2): 600 mg via ORAL
  Filled 2021-11-09 (×3): qty 3

## 2021-11-09 MED ORDER — HEPARIN SODIUM (PORCINE) 5000 UNIT/ML IJ SOLN
5000.0000 [IU] | Freq: Three times a day (TID) | INTRAMUSCULAR | Status: DC
  Administered 2021-11-09 – 2021-11-10 (×2): 5000 [IU] via SUBCUTANEOUS
  Filled 2021-11-09 (×2): qty 1

## 2021-11-09 MED ORDER — NITROGLYCERIN 0.4 MG SL SUBL: 0.4000 mg | SUBLINGUAL_TABLET | SUBLINGUAL | Status: DC | PRN

## 2021-11-09 MED ORDER — ETHOSUXIMIDE 250 MG PO CAPS
500.0000 mg | ORAL_CAPSULE | Freq: Two times a day (BID) | ORAL | Status: DC
  Administered 2021-11-09 – 2021-11-10 (×2): 500 mg via ORAL
  Filled 2021-11-09 (×3): qty 2

## 2021-11-09 MED ORDER — ASPIRIN 81 MG PO CHEW
81.0000 mg | CHEWABLE_TABLET | Freq: Every day | ORAL | Status: DC
  Administered 2021-11-10: 81 mg via ORAL
  Filled 2021-11-09: qty 1

## 2021-11-09 MED ORDER — ONDANSETRON HCL 4 MG/2ML IJ SOLN: 4.0000 mg | Freq: Four times a day (QID) | INTRAMUSCULAR | Status: DC | PRN

## 2021-11-09 MED ORDER — BEMPEDOIC ACID-EZETIMIBE 180-10 MG PO TABS: 180.0000 mg | ORAL_TABLET | Freq: Every day | ORAL | Status: DC

## 2021-11-09 MED ORDER — AZELASTINE HCL 0.1 % NA SOLN: 1.0000 | Freq: Every day | NASAL | Status: DC | PRN

## 2021-11-09 MED ORDER — ACETAMINOPHEN 325 MG PO TABS: 650.0000 mg | ORAL_TABLET | ORAL | Status: DC | PRN

## 2021-11-09 MED ORDER — MEMANTINE HCL 10 MG PO TABS
10.0000 mg | ORAL_TABLET | Freq: Two times a day (BID) | ORAL | Status: DC
  Administered 2021-11-09 – 2021-11-10 (×2): 10 mg via ORAL
  Filled 2021-11-09 (×2): qty 1

## 2021-11-09 NOTE — ED Provider Triage Note (Signed)
Emergency Medicine Provider Triage Evaluation Note ? ?Rachel Clark , a 71 y.o. female  was evaluated in triage.  Pt with significant cardiac history presents today for evaluation of chest pressure earlier today.  Patient reports after taking nitro and aspirin her pain has subsided.  Currently she is without any chest pain.  She states she took 4 baby aspirin's prior to arrival.  1 earlier in the day and 3 additional with EMS. ? ?Review of Systems  ?Positive: As above ?Negative: As above ? ?Physical Exam  ?BP 112/71   Pulse 99   Temp 97.8 ?F (36.6 ?C) (Oral)   Resp 16   SpO2 97%  ?Gen:   Awake, no distress   ?Resp:  Normal effort  ?MSK:   Moves extremities without difficulty  ?Other:   ? ?Medical Decision Making  ?Medically screening exam initiated at 2:55 PM.  Appropriate orders placed.  Rachel Clark was informed that the remainder of the evaluation will be completed by another provider, this initial triage assessment does not replace that evaluation, and the importance of remaining in the ED until their evaluation is complete. ? ? ?  ?Evlyn Courier, PA-C ?11/09/21 1457 ? ?

## 2021-11-09 NOTE — ED Notes (Signed)
ED TO INPATIENT HANDOFF REPORT ? ?ED Nurse Name and Phone #: Shirlee Limerick [phone] ? ?S ?Name/Age/Gender ?Rachel Clark ?71 y.o. ?female ?Room/Bed: 013C/013C ? ?Code Status ?  Code Status: DNR ? ?Home/SNF/Other ?Home ?Patient oriented to: self, place, time, and situation ?Is this baseline? Yes  ? ?Triage Complete: Triage complete  ?Chief Complaint ?Chest pain [R07.9] ? ?Triage Note ?BIB EMS from home.  CP x 1 day.  Mid sternal pressure.  Took asa and nitro with pain subsiding.  Hx of stents in the past.  12 lead unremarkable with EMS.  VSS ?  ? ?Allergies ?Allergies  ?Allergen Reactions  ? Sulfamethoxazole Anaphylaxis  ?  Don't recall  ? Aspirin Other (See Comments)  ?  GI upset- can tolerate 81 mg ASA, just not full doses  ? Crestor [Rosuvastatin]   ?  myalgias  ? Erythromycin Nausea Only  ?  Gi upset  ? Lisinopril Cough  ? Niacin And Related Other (See Comments)  ?  Don't recall  ? Penicillin G Benzathine   ?  Other reaction(s): Unknown  ? Statins Other (See Comments)  ?  Muscle pain  ? Sulfa Drugs Cross Reactors Swelling  ?  Don't recall  ? Tetracyclines & Related Nausea Only  ? Xeloda [Capecitabine] Diarrhea  ? Penicillins Nausea Only and Rash  ?   ?  ? Plavix [Clopidogrel Bisulfate] Rash  ? ? ?Level of Care/Admitting Diagnosis ?ED Disposition   ? ? ED Disposition  ?Admit  ? Condition  ?--  ? Comment  ?Hospital Area: [facility] [[id number]] ? Level of Care: Telemetry Cardiac [103] ? May place patient in observation at [facility] or [place] if equivalent level of care is available:: No ? Covid Evaluation: Asymptomatic - no recent exposure (last 10 days) testing not required ? Diagnosis: Chest pain [025852] ? Admitting Physician: Lauree Chandler D [[id number]] ? Attending Physician: Lauree Chandler D [[id number]] ?  ?  ? ?  ? ? ?B ?Medical/Surgery History ?Past Medical History:  ?Diagnosis Date  ? Arthritis   ? "fingers" (06/29/2016)  ? Chronic lower back pain   ? Colon cancer ([place]) 12/04/2011  ?  s/p Laparoscopic-assisted transverse colectomy on 12/19/2011 by Dr. Donne Hazel.  pT3 N0 M0.   ? Complete heart block ([place])   ? Coronary artery disease   ? Depression   ? Dyslipidemia   ? Fibromyalgia   ? Gastric ulcer   ? GERD (gastroesophageal reflux disease)   ? GI bleed   ? 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 last seizure was in 1972 ;takes Tegretol (06/29/2016)   ? Headache   ? "weekly" (06/29/2016)  ? Hyperlipidemia   ? Hyponatremia   ? Hypothyroid   ? Iron deficiency anemia 11/17/2011  ? Memory change 11/13/2016  ? Monoclonal gammopathy of unknown significance   ? Orthostatic hypotension   ? Presence of permanent cardiac pacemaker   ? Syncope and collapse   ? pacemaker implanted  ? ?Past Surgical History:  ?Procedure Laterality Date  ? ANTERIOR CERVICAL DECOMP/DISCECTOMY FUSION  1980's  ? BACK SURGERY    ? CARDIAC CATHETERIZATION  05/20/2009  ? obstructive native vessel disease in LAD, RCA, and first diagonal, patent vein graft to distal RCA and LIMA to LAD,normal. ef 60%  ? CARDIAC CATHETERIZATION N/A 03/24/2015  ? Procedure: Left Heart Cath and Cors/Grafts Angiography;  Surgeon: Wellington Hampshire, MD;  Location: [place] [facility];  Service: Cardiovascular;  Laterality: N/A;  ? CARDIAC CATHETERIZATION N/A  07/28/2015  ? Procedure: Left Heart Cath and Coronary Angiography;  Surgeon: Troy Sine, MD;  Location: [place] [facility];  Service: Cardiovascular;  Laterality: N/A;  ? CARDIAC CATHETERIZATION N/A 11/09/2015  ? Procedure: Coronary Stent Intervention;  Surgeon: Sherren Mocha, MD;  Location: [place] [facility];  Service: Cardiovascular;  Laterality: N/A;  ? CARDIAC CATHETERIZATION N/A 11/09/2015  ? Procedure: Left Heart Cath and Coronary Angiography;  Surgeon: Sherren Mocha, MD;  Location: [place] [facility];  Service: Cardiovascular;  Laterality: N/A;  ? CARDIAC CATHETERIZATION N/A 06/29/2016  ? Procedure: Left Heart Cath and Coronary Angiography;  Surgeon: Nelva Bush, MD;  Location: [place] [facility];  Service: Cardiovascular;  Laterality: N/A;  ? CARDIAC CATHETERIZATION N/A 06/29/2016  ? Procedure: Intravascular Pressure Wire/FFR Study;  Surgeon: Nelva Bush, MD;  Location: [place] [facility];  Service: Cardiovascular;  Laterality: N/A;  ? CARDIAC CATHETERIZATION N/A 06/29/2016  ? Procedure: Coronary Balloon Angioplasty;  Surgeon: Nelva Bush, MD;  Location: [place] [facility];  Service: Cardiovascular;  Laterality: N/A;  ? COLON RESECTION  12/19/2011  ? Procedure: COLON RESECTION LAPAROSCOPIC;  Surgeon: Rolm Bookbinder, MD;  Location: [place];  Service: General;  Laterality: N/A;  laparoscopic hand assisted partial colon resection  ? COLON SURGERY    ? COLONOSCOPY    ? CORONARY ANGIOPLASTY WITH STENT PLACEMENT  ~ 2007  ? 1 stent  ? CORONARY ARTERY BYPASS GRAFT  2007  ? "CABG X2"  ? [place]  ? EP IMPLANTABLE DEVICE N/A 03/25/2015  ? MDT Advisa DR pacemaker implanted by Dr Rayann Heman for transient complete heart block and syncope  ? ESOPHAGOGASTRODUODENOSCOPY (EGD) WITH PROPOFOL N/A 03/14/2021  ? Procedure: ESOPHAGOGASTRODUODENOSCOPY (EGD) WITH PROPOFOL;  Surgeon: Ronnette Juniper, MD;  Location: [place];  Service: Gastroenterology;  Laterality: N/A;  ? HEMOSTASIS CLIP PLACEMENT  03/14/2021  ? Procedure: HEMOSTASIS CLIP PLACEMENT;  Surgeon: Ronnette Juniper, MD;  Location: [place];  Service: Gastroenterology;;  ? INSERT / REPLACE / REMOVE PACEMAKER    ? LEFT HEART CATH AND CORS/GRAFTS ANGIOGRAPHY N/A 02/21/2018  ? Procedure: LEFT HEART CATH AND CORS/GRAFTS ANGIOGRAPHY;  Surgeon: Lorretta Harp, MD;  Location: [place] [facility];  Service: Cardiovascular;  Laterality: N/A;  ? LEFT HEART CATH AND CORS/GRAFTS ANGIOGRAPHY N/A 03/08/2021  ? Procedure: LEFT HEART CATH AND CORS/GRAFTS ANGIOGRAPHY;  Surgeon: [name], Peter M, MD;  Location: [place] [facility];  Service: Cardiovascular;  Laterality: N/A;  ? PORT-A-CATH REMOVAL N/A 06/12/2013  ? Procedure: REMOVAL PORT-A-CATH;   Surgeon: Rolm Bookbinder, MD;  Location: [place];  Service: General;  Laterality: N/A;  ? PORTACATH PLACEMENT  06/04/2012  ? Procedure: INSERTION PORT-A-CATH;  Surgeon: Rolm Bookbinder, MD;  Location: [place];  Service: General;  Laterality: N/A;  Insertion of port-a-cath   ? POSTERIOR LAMINECTOMY / DECOMPRESSION CERVICAL SPINE  1990s  ? SCLEROTHERAPY  03/14/2021  ? Procedure: SCLEROTHERAPY;  Surgeon: Ronnette Juniper, MD;  Location: [place];  Service: Gastroenterology;;  ? VAGINAL HYSTERECTOMY    ?  ? ?A ?IV Location/Drains/Wounds ?Patient Lines/Drains/Airways Status   ? ? Active Line/Drains/Airways   ? ? Name Placement date Placement time Site Days  ? Peripheral IV 11/09/21 18 G Left Antecubital 11/09/21  --  Antecubital  less than 1  ? ?  ?  ? ?  ? ? ?Intake/Output Last 24 hours ?No intake or output data in the 24 hours ending 11/09/21 1750 ? ?Labs/Imaging ?Results for orders placed or  performed during the hospital encounter of 11/09/21 (from the past 48 hour(s))  ?Basic metabolic panel     Status: Abnormal  ? Collection Time: 11/09/21  2:54 PM  ?Result Value Ref Range  ? Sodium 131 (L) 135 - 145 mmol/L  ? Potassium 4.6 3.5 - 5.1 mmol/L  ? Chloride 101 98 - 111 mmol/L  ? CO2 24 22 - 32 mmol/L  ? Glucose, Bld 111 (H) 70 - 99 mg/dL  ?  Comment: Glucose reference range applies only to samples taken after fasting for at least 8 hours.  ? BUN 19 8 - 23 mg/dL  ? Creatinine, Ser 0.73 0.44 - 1.00 mg/dL  ? Calcium 8.5 (L) 8.9 - 10.3 mg/dL  ? GFR, Estimated >60 >60 mL/min  ?  Comment: (NOTE) ?Calculated using the CKD-EPI Creatinine Equation (2021) ?  ? Anion gap 6 5 - 15  ?  Comment: Performed at [facility], [place] [address] [place], [place] [address]  ?CBC     Status: Abnormal  ? Collection Time: 11/09/21  2:54 PM  ?Result Value Ref Range  ? WBC 7.6 4.0 - 10.5 K/uL  ? RBC 4.07 3.87 - 5.11 MIL/uL  ? Hemoglobin 11.5 (L) 12.0 - 15.0 g/dL  ? HCT 35.1 (L) 36.0 - 46.0 %  ? MCV 86.2 80.0 - 100.0 fL  ?  MCH 28.3 26.0 - 34.0 pg  ? MCHC 32.8 30.0 - 36.0 g/dL  ? RDW 13.2 11.5 - 15.5 %  ? Platelets 333 150 - 400 K/uL  ? nRBC 0.0 0.0 - 0.2 %  ?  Comment: Performed at [facility], [place] [address],

## 2021-11-09 NOTE — ED Provider Notes (Signed)
?[facility] ?Provider Note ? ? ?CSN: [id number] ?Arrival date & time: 11/09/21  1446 ? ?  ? ?History ? ?Chief Complaint  ?Patient presents with  ? Chest Pain  ? ? ?Rachel Clark is a 71 y.o. female. ? ? ?Chest Pain ?Associated symptoms: dizziness   ?Associated symptoms: no abdominal pain and no weakness   ?Patient presents with chest pain.  Anterior chest.  Dull.  Reportedly had episode last night and another one today.  Unsure how long they lasted although went away with nitroglycerin.  Has a history of coronary artery disease.  Back in September had new stents that were somewhat difficult to place.  Has not had chest pain since although has been dealing with an orthostasis for a while.  Has been feeling lightheaded at times.  Not initially having chest pain with it.  States she feels dizzy all the time now.  Reportedly blood pressure was initially a little low yesterday with the episode and then normal to high after.  Today it was normal to slightly high with the episode.  Feels like previous MI.  No nausea or vomiting.  Pain-free now. ?  ?Past Medical History:  ?Diagnosis Date  ? Arthritis   ? "fingers" (06/29/2016)  ? Chronic lower back pain   ? Colon cancer ([place]) 12/04/2011  ? s/p Laparoscopic-assisted transverse colectomy on 12/19/2011 by Dr. Donne Hazel.  pT3 N0 M0.   ? Complete heart block ([place])   ? Coronary artery disease   ? Depression   ? Dyslipidemia   ? Fibromyalgia   ? Gastric ulcer   ? GERD (gastroesophageal reflux disease)   ? GI bleed   ? 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 last seizure was in 1972 ;takes Tegretol (06/29/2016)   ? Headache   ? "weekly" (06/29/2016)  ? Hyperlipidemia   ? Hyponatremia   ? Hypothyroid   ? Iron deficiency anemia 11/17/2011  ? Memory change 11/13/2016  ? Monoclonal gammopathy of unknown significance   ? Orthostatic hypotension   ? Presence of permanent cardiac pacemaker   ? Syncope and collapse   ? pacemaker implanted   ? ? ?Home Medications ?Prior to Admission medications   ?Medication Sig Start Date End Date Taking? Authorizing Provider  ?acetaminophen (TYLENOL) 500 MG tablet Take 500-1,000 mg by mouth daily as needed for mild pain or headache.   Yes [provider]  ?aspirin 81 MG chewable tablet Chew 1 tablet (81 mg total) by mouth daily. 06/30/16  Yes Arbutus Leas, NP  ?Azelastine HCl 0.15 % SOLN Place 1 spray into both nostrils daily as needed (seasonal allergies). 12/22/14  Yes [provider]  ?Bempedoic Acid-Ezetimibe (NEXLIZET) 180-10 MG TABS Take 180 mg by mouth daily. 12/29/20  Yes [name], Peter M, MD  ?calcium carbonate (TUMS - DOSED IN MG ELEMENTAL CALCIUM) 500 MG chewable tablet Chew 500 mg by mouth daily.   Yes [provider]  ?carbamazepine (TEGRETOL XR) 200 MG 12 hr tablet Take 3 tablets (600 mg total) by mouth 2 (two) times daily. 09/13/21 12/07/22 Yes Penumalli, Earlean Polka, MD  ?Cholecalciferol (VITAMIN D) 50 MCG (2000 UT) CAPS Take 2,000 Units by mouth daily.   Yes [provider]  ?diphenhydrAMINE HCl (BENADRYL ALLERGY PO) Take 1 tablet by mouth daily as needed (allergy).   Yes [provider]  ?diphenhydramine-acetaminophen (TYLENOL PM) 25-500 MG TABS tablet Take 1 tablet by mouth at bedtime.   Yes [provider]  ?DULoxetine (CYMBALTA)  60 MG capsule Take 60 mg by mouth 2 (two) times daily.   Yes [provider]  ?ethosuximide (ZARONTIN) 250 MG capsule Take 2 capsules (500 mg total) by mouth 2 (two) times daily. 09/13/21 12/07/22 Yes Penumalli, Earlean Polka, MD  ?Fluticasone Propionate (FLONASE ALLERGY RELIEF NA) Place 1 spray into the nose daily as needed (allergy relief).   Yes [provider]  ?levothyroxine (SYNTHROID, LEVOTHROID) 50 MCG tablet Take 50 mcg by mouth daily before breakfast.   Yes [provider]  ?memantine (NAMENDA) 10 MG tablet Take 1 tablet (10 mg total) by mouth 2 (two) times daily. 09/13/21  Yes Penumalli, Earlean Polka,  MD  ?nitroGLYCERIN (NITROSTAT) 0.4 MG SL tablet Place 1 tablet (0.4 mg total) under the tongue every 5 (five) minutes as needed. For chest pain. 09/10/20  Yes [name], Peter M, MD  ?pantoprazole (PROTONIX) 40 MG tablet Take 1 tablet (40 mg total) by mouth 2 (two) times daily. AFTER 2 MONTHS CONT AS ONCE A DAY ?Patient taking differently: Take 40 mg by mouth daily. 03/17/21 11/09/21 Yes Antonieta Pert, MD  ?prasugrel (EFFIENT) 10 MG TABS tablet Take 1 tablet (10 mg total) by mouth daily. 03/10/21  Yes Jerline Pain, MD  ?Evolocumab with Infusor ([place]) 420 MG/3.5ML SOCT Inject 420 mg into the skin every 30 (thirty) days. ?Patient not taking: Reported on 11/09/2021 11/07/21   [name], Peter M, MD  ?   ? ?Allergies    ?Sulfamethoxazole, Aspirin, Crestor [rosuvastatin], Erythromycin, Lisinopril, Niacin and related, Penicillin g benzathine, Statins, Sulfa drugs cross reactors, Tetracyclines & related, Xeloda [capecitabine], Penicillins, and Plavix [clopidogrel bisulfate]   ? ?Review of Systems   ?Review of Systems  ?Constitutional:  Negative for appetite change.  ?HENT:  Negative for congestion.   ?Cardiovascular:  Positive for chest pain.  ?Gastrointestinal:  Negative for abdominal pain.  ?Neurological:  Positive for dizziness. Negative for weakness.  ? ?Physical Exam ?Updated Vital Signs ?BP 135/79 (BP Location: Right Arm)   Pulse 97   Temp 98.3 ?F (36.8 ?C) (Oral)   Resp 12   Ht '5\' 7"'$  (1.702 m)   Wt 88.1 kg   SpO2 100%   BMI 30.42 kg/m?  ?Physical Exam ?Vitals and nursing note reviewed.  ?HENT:  ?   Head: Normocephalic.  ?Cardiovascular:  ?   Rate and Rhythm: Normal rate and regular rhythm.  ?Chest:  ?   Chest wall: No tenderness.  ?Abdominal:  ?   Tenderness: There is no abdominal tenderness.  ?Musculoskeletal:  ?   Right lower leg: No edema.  ?   Left lower leg: No edema.  ?Skin: ?   General: Skin is warm.  ?   Capillary Refill: Capillary refill takes less than 2 seconds.  ?Neurological:  ?   Mental  Status: She is alert.  ? ? ?ED Results / Procedures / Treatments   ?Labs ?(all labs ordered are listed, but only abnormal results are displayed) ?Labs Reviewed  ?BASIC METABOLIC PANEL - Abnormal; Notable for the following components:  ?    Result Value  ? Sodium 131 (*)   ? Glucose, Bld 111 (*)   ? Calcium 8.5 (*)   ? All other components within normal limits  ?CBC - Abnormal; Notable for the following components:  ? Hemoglobin 11.5 (*)   ? HCT 35.1 (*)   ? All other components within normal limits  ?TROPONIN I (HIGH SENSITIVITY) - Abnormal; Notable for the following components:  ? Troponin I (High Sensitivity)  23 (*)   ? All other components within normal limits  ?TROPONIN I (HIGH SENSITIVITY) - Abnormal; Notable for the following components:  ? Troponin I (High Sensitivity) 41 (*)   ? All other components within normal limits  ?BASIC METABOLIC PANEL  ?CBC  ?TSH  ?TROPONIN I (HIGH SENSITIVITY)  ? ? ?EKG ?EKG Interpretation ? ?Date/Time:  Wednesday Nov 09 2021 14:52:54 EDT ?Ventricular Rate:  104 ?PR Interval:  200 ?QRS Duration: 86 ?QT Interval:  332 ?QTC Calculation: 436 ?R Axis:   49 ?Text Interpretation: Sinus tachycardia Nonspecific ST abnormality Abnormal ECG When compared with ECG of 13-Mar-2021 09:39, No significant change since last tracing Confirmed by Davonna Belling [phone]) on 11/09/2021 4:10:51 PM ? ?Radiology ?DG Chest 2 View ? ?Result Date: 11/09/2021 ?CLINICAL DATA:  Chest pain. EXAM: CHEST - 2 VIEW COMPARISON:  March 13, 2021. FINDINGS: The heart size and mediastinal contours are within normal limits. Status post coronary bypass graft. Left-sided pacemaker is unchanged in position. Both lungs are clear. The visualized skeletal structures are unremarkable. IMPRESSION: No active cardiopulmonary disease. Electronically Signed   By: Marijo Conception M.D.   On: 11/09/2021 15:23   ? ?Procedures ?Procedures  ? ? ?Medications Ordered in ED ?Medications  ?isosorbide mononitrate (IMDUR) 24 hr tablet 15 mg  (15 mg Oral Given 11/09/21 1752)  ?aspirin chewable tablet 81 mg (has no administration in time range)  ?Bempedoic Acid-Ezetimibe 180-10 MG TABS 180 mg (has no administration in time range)  ?DULoxetine (CYMBA

## 2021-11-09 NOTE — ED Triage Notes (Signed)
BIB EMS from home.  CP x 1 day.  Mid sternal pressure.  Took asa and nitro with pain subsiding.  Hx of stents in the past.  12 lead unremarkable with EMS.  VSS ? ?

## 2021-11-09 NOTE — Progress Notes (Signed)
First troponin 9, second troponin 23 (marginal elevation). ?D/w Dr. Angelena Form - since this has remained low level he recommends to hold off heparin but recheck in 2 hours. If rises to >50 or patient has recurrent pain, would consider heparin per pharmacy. Will plan to sign out to on call fellow to follow up this value. We will also tentatively make NPO after midnight for review of troponin trend in AM. ?

## 2021-11-09 NOTE — H&P (Addendum)
?Cardiology Admission History and Physical:  ? ?Patient ID: Rachel Clark ?MRN: [id number]; DOB: 15-Dec-1950  ? ?Admission date: 11/09/2021 ? ?PCP:  Harlan Stains, MD ?  ?[facility] Providers ?Cardiologist:  Peter [name], MD      ? ? ?Chief Complaint:  chest pain ? ? ?Patient Profile:  ? ?Rachel Clark is a 71 y.o. female with CAD (remote NSTEMI s/p PCI to RCA, s/p CABG 2007, subsequent failure of SVG-RCA by cath in 2016, DESx3 - full metal jacket- to RCA in 11/2015, NSTEMI 03/2021 with DESx3 to ramus intermedius), CHB/syncope s/p PPM in 03/2015, recurrent syncope/dizziness then felt due to orthostasis, GIB 03/2021 s/p transfusion with gastric antrum vessel s/p epi/clipping, HL with statin intolerance, colon CA s/p resection and chemotherapy, seizures, memory loss, chronic hyponatremia, fibromyalgia, GERD, hypothyroidism, former smoker who is being seen 11/09/2021 for the evaluation of chest pain. ? ?History of Present Illness:  ? ?Ms. Markiewicz has the above complex coronary history with CAD, syncope, pacemaker. It has been felt she requires permissive BP and salt liberalization due to h/o orthostasis and syncope. Last cath is outlined below with DESx3 to RI. She was placed on Effient due to interaction of Brilinta with Tegretol and history of rash on Plavix. Indefinite DAPT was recommended due to extensive stents. A week after her PCI she had been readmitted with acute upper GI bleed with coffee ground emesis. Hgb dropped to 7.7 then to 6.5. she was transfused multiple units of PRBCs. DAPT was continued given recent stents. EGD showed a visible vessel in the gastric antrum treated with epinephrine injection and clipping. Treated with PPI. Bleeding scan was subsequently negative. Hgb improved to 9.6 at discharge. She was recently referred to lipid clinic for LDL 124 despite Nexlizet -> paperwork started for Rep[facility]. Last device interrogation 10/10/21 was normal. ? ?She is accompanied by her husband today who  brought her in due to 2 separate instances of chest pain. She otherwise has been in her USOH lately, doing reasonably well, except with chronic lightheadedness. Unfortunately the patient's memory loss makes it difficult for her to give many details about these events; her husband only heard about them after the occurrences. She had an episode of chest pain and took 1 SL NTG and ASA with improvement. They checked her BP and it was initially lower than usual but they do not recall the reading. A recheck confirmed it came back to baseline soon after. The rest of the evening was uneventful until a recurrent spell this morning, relieved with 1 SL NTG and ASA. She is unable to say how long it lasted - states "honestly, I couldn't tell you anything about the episode" - has chronic memory loss. She does not recall having any additional symptoms with the chest pain or recently otherwise. Here she has been chest pain free. EKG shows borderline sinus tach with NSST changes similar to prior. Labs pertinent for Na 131 (known to be intermittently low), Hgb 11.5 (previously 13.8 in 10/2021 then 9.6 before that in 03/2021), hsTroponin neg x 1. CXR NAD. ? ? ?Past Medical History:  ?Diagnosis Date  ? Arthritis   ? "fingers" (06/29/2016)  ? Chronic lower back pain   ? Colon cancer ([place]) 12/04/2011  ? s/p Laparoscopic-assisted transverse colectomy on 12/19/2011 by Dr. Donne Hazel.  pT3 N0 M0.   ? Coronary artery disease   ? a. remote NSTEMI with PCI to the RCA; b. S/P CABG in 2007; c. 2010 - 2 v CAD with L-LAD and S-RCA  patent, small caliber Dx 70% (treated medically - no amenable to PCI), CFX free of significant disease, normal LVF. d. 10/2014 Lexi MV: EF 61%, no ischemia/infarct.  ? Depression   ? Dyslipidemia   ? Fibromyalgia   ? Gastric ulcer   ? GERD (gastroesophageal reflux disease)   ? 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 last seizure was in 1972 ;takes Tegretol (06/29/2016)   ? Headache   ? "weekly" (06/29/2016)  ?  Hyperlipidemia   ? Hypothyroid   ? Iron deficiency anemia 11/17/2011  ? Memory change 11/13/2016  ? Monoclonal gammopathy of unknown significance   ? Orthostatic hypotension 12/21/2020  ? Presence of permanent cardiac pacemaker   ? Syncope and collapse   ? pacemaker implanted  ? ? ?Past Surgical History:  ?Procedure Laterality Date  ? ANTERIOR CERVICAL DECOMP/DISCECTOMY FUSION  1980's  ? BACK SURGERY    ? CARDIAC CATHETERIZATION  05/20/2009  ? obstructive native vessel disease in LAD, RCA, and first diagonal, patent vein graft to distal RCA and LIMA to LAD,normal. ef 60%  ? CARDIAC CATHETERIZATION N/A 03/24/2015  ? Procedure: Left Heart Cath and Cors/Grafts Angiography;  Surgeon: Wellington Hampshire, MD;  Location: [place] [facility];  Service: Cardiovascular;  Laterality: N/A;  ? CARDIAC CATHETERIZATION N/A 07/28/2015  ? Procedure: Left Heart Cath and Coronary Angiography;  Surgeon: Troy Sine, MD;  Location: [place] [facility];  Service: Cardiovascular;  Laterality: N/A;  ? CARDIAC CATHETERIZATION N/A 11/09/2015  ? Procedure: Coronary Stent Intervention;  Surgeon: Sherren Mocha, MD;  Location: [place] [place] [facility];  Service: Cardiovascular;  Laterality: N/A;  ? CARDIAC CATHETERIZATION N/A 11/09/2015  ? Procedure: Left Heart Cath and Coronary Angiography;  Surgeon: Sherren Mocha, MD;  Location: [place];  Service: Cardiovascular;  Laterality: N/A;  ? CARDIAC CATHETERIZATION N/A 06/29/2016  ? Procedure: Left Heart Cath and Coronary Angiography;  Surgeon: Nelva Bush, MD;  Location: [place] [facility];  Service: Cardiovascular;  Laterality: N/A;  ? CARDIAC CATHETERIZATION N/A 06/29/2016  ? Procedure: Intravascular Pressure Wire/FFR Study;  Surgeon: Nelva Bush, MD;  Location: [place] [facility];  Service: Cardiovascular;  Laterality: N/A;  ? CARDIAC CATHETERIZATION N/A 06/29/2016  ? Procedure: Coronary Balloon Angioplasty;  Surgeon: Nelva Bush, MD;  Location: [place] [facility];  Service:  Cardiovascular;  Laterality: N/A;  ? COLON RESECTION  12/19/2011  ? Procedure: COLON RESECTION LAPAROSCOPIC;  Surgeon: Rolm Bookbinder, MD;  Location: [facility];  Service: General;  Laterality: N/A;  laparoscopic hand assisted partial colon resection  ? COLON SURGERY    ? COLONOSCOPY    ? CORONARY ANGIOPLASTY WITH STENT PLACEMENT  ~ 2007  ? 1 stent  ? CORONARY ARTERY BYPASS GRAFT  2007  ? "CABG X2"  ? [place]  ? EP IMPLANTABLE DEVICE N/A 03/25/2015  ? MDT Advisa DR pacemaker implanted by Dr Rayann Heman for transient complete heart block and syncope  ? ESOPHAGOGASTRODUODENOSCOPY (EGD) WITH PROPOFOL N/A 03/14/2021  ? Procedure: ESOPHAGOGASTRODUODENOSCOPY (EGD) WITH PROPOFOL;  Surgeon: Ronnette Juniper, MD;  Location: [place];  Service: Gastroenterology;  Laterality: N/A;  ? HEMOSTASIS CLIP PLACEMENT  03/14/2021  ? Procedure: HEMOSTASIS CLIP PLACEMENT;  Surgeon: Ronnette Juniper, MD;  Location: [place];  Service: Gastroenterology;;  ? INSERT / REPLACE / REMOVE PACEMAKER    ? LEFT HEART CATH AND CORS/GRAFTS ANGIOGRAPHY N/A 02/21/2018  ? Procedure: LEFT HEART CATH AND CORS/GRAFTS ANGIOGRAPHY;  Surgeon: Lorretta Harp, MD;  Location: [place]  [facility];  Service: Cardiovascular;  Laterality: N/A;  ? LEFT HEART CATH AND CORS/GRAFTS ANGIOGRAPHY N/A 03/08/2021  ? Procedure: LEFT HEART CATH AND CORS/GRAFTS ANGIOGRAPHY;  Surgeon: [name], Peter M, MD;  Location: [place];  Service: Cardiovascular;  Laterality: N/A;  ? PORT-A-CATH REMOVAL N/A 06/12/2013  ? Procedure: REMOVAL PORT-A-CATH;  Surgeon: Rolm Bookbinder, MD;  Location: [place];  Service: General;  Laterality: N/A;  ? PORTACATH PLACEMENT  06/04/2012  ? Procedure: INSERTION PORT-A-CATH;  Surgeon: Rolm Bookbinder, MD;  Location: [place];  Service: General;  Laterality: N/A;  Insertion of port-a-cath   ? POSTERIOR LAMINECTOMY / DECOMPRESSION CERVICAL SPINE  1990s  ? SCLEROTHERAPY  03/14/2021  ? Procedure: SCLEROTHERAPY;   Surgeon: Ronnette Juniper, MD;  Location: [place];  Service: Gastroenterology;;  ? VAGINAL HYSTERECTOMY    ?  ? ?Medications Prior to Admission: ?Prior to Admission medications   ?Medication Sig Start Date End Date Taking

## 2021-11-10 ENCOUNTER — Other Ambulatory Visit: Payer: Self-pay

## 2021-11-10 ENCOUNTER — Encounter (HOSPITAL_COMMUNITY): Payer: Self-pay | Admitting: Emergency Medicine

## 2021-11-10 ENCOUNTER — Emergency Department (HOSPITAL_COMMUNITY): Payer: Medicare Other

## 2021-11-10 ENCOUNTER — Telehealth: Payer: Self-pay | Admitting: Cardiology

## 2021-11-10 ENCOUNTER — Inpatient Hospital Stay (HOSPITAL_COMMUNITY)
Admission: EM | Admit: 2021-11-10 | Discharge: 2021-11-14 | DRG: 248 | Disposition: A | Discharge status: Home / Self Care | Payer: Medicare Other | Attending: Cardiovascular Disease | Admitting: Cardiovascular Disease

## 2021-11-10 DIAGNOSIS — E871 Hypo-osmolality and hyponatremia: Secondary | ICD-10-CM | POA: Diagnosis present

## 2021-11-10 DIAGNOSIS — F32A Depression, unspecified: Secondary | ICD-10-CM | POA: Diagnosis not present

## 2021-11-10 DIAGNOSIS — I495 Sick sinus syndrome: Secondary | ICD-10-CM | POA: Diagnosis present

## 2021-11-10 DIAGNOSIS — Z981 Arthrodesis status: Secondary | ICD-10-CM

## 2021-11-10 DIAGNOSIS — R0789 Other chest pain: Secondary | ICD-10-CM | POA: Diagnosis not present

## 2021-11-10 DIAGNOSIS — Z79899 Other long term (current) drug therapy: Secondary | ICD-10-CM

## 2021-11-10 DIAGNOSIS — Z87891 Personal history of nicotine dependence: Secondary | ICD-10-CM

## 2021-11-10 DIAGNOSIS — M797 Fibromyalgia: Secondary | ICD-10-CM | POA: Diagnosis present

## 2021-11-10 DIAGNOSIS — D472 Monoclonal gammopathy: Secondary | ICD-10-CM | POA: Diagnosis present

## 2021-11-10 DIAGNOSIS — R413 Other amnesia: Secondary | ICD-10-CM | POA: Diagnosis present

## 2021-11-10 DIAGNOSIS — E039 Hypothyroidism, unspecified: Secondary | ICD-10-CM | POA: Diagnosis not present

## 2021-11-10 DIAGNOSIS — I25118 Atherosclerotic heart disease of native coronary artery with other forms of angina pectoris: Secondary | ICD-10-CM

## 2021-11-10 DIAGNOSIS — Z7982 Long term (current) use of aspirin: Secondary | ICD-10-CM

## 2021-11-10 DIAGNOSIS — I10 Essential (primary) hypertension: Secondary | ICD-10-CM | POA: Diagnosis not present

## 2021-11-10 DIAGNOSIS — Z66 Do not resuscitate: Secondary | ICD-10-CM | POA: Diagnosis present

## 2021-11-10 DIAGNOSIS — Z955 Presence of coronary angioplasty implant and graft: Secondary | ICD-10-CM

## 2021-11-10 DIAGNOSIS — G40409 Other generalized epilepsy and epileptic syndromes, not intractable, without status epilepticus: Secondary | ICD-10-CM | POA: Diagnosis present

## 2021-11-10 DIAGNOSIS — Z880 Allergy status to penicillin: Secondary | ICD-10-CM

## 2021-11-10 DIAGNOSIS — D500 Iron deficiency anemia secondary to blood loss (chronic): Secondary | ICD-10-CM

## 2021-11-10 DIAGNOSIS — Z90710 Acquired absence of both cervix and uterus: Secondary | ICD-10-CM

## 2021-11-10 DIAGNOSIS — Z85038 Personal history of other malignant neoplasm of large intestine: Secondary | ICD-10-CM

## 2021-11-10 DIAGNOSIS — K254 Chronic or unspecified gastric ulcer with hemorrhage: Secondary | ICD-10-CM | POA: Diagnosis present

## 2021-11-10 DIAGNOSIS — T82855A Stenosis of coronary artery stent, initial encounter: Secondary | ICD-10-CM

## 2021-11-10 DIAGNOSIS — E785 Hyperlipidemia, unspecified: Secondary | ICD-10-CM | POA: Diagnosis present

## 2021-11-10 DIAGNOSIS — Y831 Surgical operation with implant of artificial internal device as the cause of abnormal reaction of the patient, or of later complication, without mention of misadventure at the time of the procedure: Secondary | ICD-10-CM | POA: Diagnosis present

## 2021-11-10 DIAGNOSIS — Z809 Family history of malignant neoplasm, unspecified: Secondary | ICD-10-CM

## 2021-11-10 DIAGNOSIS — Z950 Presence of cardiac pacemaker: Secondary | ICD-10-CM | POA: Diagnosis present

## 2021-11-10 DIAGNOSIS — Z888 Allergy status to other drugs, medicaments and biological substances status: Secondary | ICD-10-CM

## 2021-11-10 DIAGNOSIS — Z951 Presence of aortocoronary bypass graft: Secondary | ICD-10-CM

## 2021-11-10 DIAGNOSIS — I25110 Atherosclerotic heart disease of native coronary artery with unstable angina pectoris: Principal | ICD-10-CM | POA: Diagnosis present

## 2021-11-10 DIAGNOSIS — G8929 Other chronic pain: Secondary | ICD-10-CM | POA: Diagnosis present

## 2021-11-10 DIAGNOSIS — I252 Old myocardial infarction: Secondary | ICD-10-CM

## 2021-11-10 DIAGNOSIS — R079 Chest pain, unspecified: Principal | ICD-10-CM

## 2021-11-10 DIAGNOSIS — I2510 Atherosclerotic heart disease of native coronary artery without angina pectoris: Secondary | ICD-10-CM | POA: Diagnosis present

## 2021-11-10 DIAGNOSIS — I951 Orthostatic hypotension: Secondary | ICD-10-CM | POA: Diagnosis present

## 2021-11-10 DIAGNOSIS — Z886 Allergy status to analgesic agent status: Secondary | ICD-10-CM

## 2021-11-10 DIAGNOSIS — Z79890 Hormone replacement therapy (postmenopausal): Secondary | ICD-10-CM

## 2021-11-10 DIAGNOSIS — K219 Gastro-esophageal reflux disease without esophagitis: Secondary | ICD-10-CM | POA: Diagnosis present

## 2021-11-10 DIAGNOSIS — Z8249 Family history of ischemic heart disease and other diseases of the circulatory system: Secondary | ICD-10-CM

## 2021-11-10 DIAGNOSIS — Z882 Allergy status to sulfonamides status: Secondary | ICD-10-CM

## 2021-11-10 DIAGNOSIS — I214 Non-ST elevation (NSTEMI) myocardial infarction: Secondary | ICD-10-CM | POA: Diagnosis not present

## 2021-11-10 LAB — CBC
HCT: 28.1 % — ABNORMAL LOW (ref 36.0–46.0)
HCT: 29.3 % — ABNORMAL LOW (ref 36.0–46.0)
Hemoglobin: 9.4 g/dL — ABNORMAL LOW (ref 12.0–15.0)
Hemoglobin: 9.4 g/dL — ABNORMAL LOW (ref 12.0–15.0)
MCH: 29 pg (ref 26.0–34.0)
MCH: 27.8 pg (ref 26.0–34.0)
MCHC: 33.5 g/dL (ref 30.0–36.0)
MCHC: 32.1 g/dL (ref 30.0–36.0)
MCV: 86.7 fL (ref 80.0–100.0)
MCV: 86.7 fL (ref 80.0–100.0)
Platelets: 269 10*3/uL (ref 150–400)
Platelets: 328 10*3/uL (ref 150–400)
RBC: 3.24 MIL/uL — ABNORMAL LOW (ref 3.87–5.11)
RBC: 3.38 MIL/uL — ABNORMAL LOW (ref 3.87–5.11)
RDW: 13.2 % (ref 11.5–15.5)
RDW: 13.2 % (ref 11.5–15.5)
WBC: 7 10*3/uL (ref 4.0–10.5)
WBC: 7.9 10*3/uL (ref 4.0–10.5)
nRBC: 0 % (ref 0.0–0.2)
nRBC: 0 % (ref 0.0–0.2)

## 2021-11-10 LAB — BASIC METABOLIC PANEL
Anion gap: 8 (ref 5–15)
Anion gap: 6 (ref 5–15)
BUN: 33 mg/dL — ABNORMAL HIGH (ref 8–23)
BUN: 26 mg/dL — ABNORMAL HIGH (ref 8–23)
CO2: 21 mmol/L — ABNORMAL LOW (ref 22–32)
CO2: 24 mmol/L (ref 22–32)
Calcium: 8.1 mg/dL — ABNORMAL LOW (ref 8.9–10.3)
Calcium: 8.6 mg/dL — ABNORMAL LOW (ref 8.9–10.3)
Chloride: 103 mmol/L (ref 98–111)
Chloride: 102 mmol/L (ref 98–111)
Creatinine, Ser: 0.8 mg/dL (ref 0.44–1.00)
Creatinine, Ser: 0.83 mg/dL (ref 0.44–1.00)
GFR, Estimated: >60 mL/min (ref >60)
GFR, Estimated: >60 mL/min (ref >60)
Glucose, Bld: 114 mg/dL — ABNORMAL HIGH (ref 70–99)
Glucose, Bld: 132 mg/dL — ABNORMAL HIGH (ref 70–99)
Potassium: 4.2 mmol/L (ref 3.5–5.1)
Potassium: 4.2 mmol/L (ref 3.5–5.1)
Sodium: 132 mmol/L — ABNORMAL LOW (ref 135–145)
Sodium: 132 mmol/L — ABNORMAL LOW (ref 135–145)

## 2021-11-10 LAB — TROPONIN I (HIGH SENSITIVITY)
Troponin I (High Sensitivity): 38 ng/L — ABNORMAL HIGH (ref <18)
Troponin I (High Sensitivity): 31 ng/L — ABNORMAL HIGH (ref <18)
Troponin I (High Sensitivity): 27 ng/L — ABNORMAL HIGH (ref <18)
Troponin I (High Sensitivity): 259 ng/L (ref <18)

## 2021-11-10 LAB — TSH: TSH: 1.782 u[IU]/mL (ref 0.350–4.500)

## 2021-11-10 MED ORDER — ETHOSUXIMIDE 250 MG PO CAPS
500.0000 mg | ORAL_CAPSULE | Freq: Two times a day (BID) | ORAL | Status: DC
Start: 2021-11-11 — End: 2021-11-14
  Administered 2021-11-11 – 2021-11-14 (×7): 500 mg via ORAL
  Filled 2021-11-10 (×9): qty 2

## 2021-11-10 MED ORDER — PRASUGREL HCL 10 MG PO TABS
10.0000 mg | ORAL_TABLET | Freq: Every day | ORAL | Status: DC
  Administered 2021-11-11 – 2021-11-14 (×4): 10 mg via ORAL
  Filled 2021-11-10 (×4): qty 1

## 2021-11-10 MED ORDER — ACETAMINOPHEN 325 MG PO TABS
650.0000 mg | ORAL_TABLET | ORAL | Status: DC | PRN
  Administered 2021-11-11: 650 mg via ORAL

## 2021-11-10 MED ORDER — DIPHENHYDRAMINE HCL 25 MG PO CAPS
25.0000 mg | ORAL_CAPSULE | Freq: Every day | ORAL | Status: DC
  Administered 2021-11-11 – 2021-11-13 (×3): 25 mg via ORAL
  Filled 2021-11-10 (×3): qty 1

## 2021-11-10 MED ORDER — ZOLPIDEM TARTRATE 5 MG PO TABS: 5.0000 mg | ORAL_TABLET | Freq: Every evening | ORAL | Status: DC | PRN

## 2021-11-10 MED ORDER — PANTOPRAZOLE SODIUM 40 MG PO TBEC
40.0000 mg | DELAYED_RELEASE_TABLET | Freq: Every day | ORAL | Status: DC
  Administered 2021-11-11 – 2021-11-12 (×2): 40 mg via ORAL
  Filled 2021-11-10 (×2): qty 1

## 2021-11-10 MED ORDER — ISOSORBIDE MONONITRATE ER 30 MG PO TB24: 15.0000 mg | ORAL_TABLET | Freq: Every day | ORAL | 3 refills | Status: DC

## 2021-11-10 MED ORDER — ASPIRIN 81 MG PO CHEW
81.0000 mg | CHEWABLE_TABLET | Freq: Every day | ORAL | Status: DC
  Administered 2021-11-12 – 2021-11-14 (×3): 81 mg via ORAL
  Filled 2021-11-10 (×3): qty 1

## 2021-11-10 MED ORDER — DULOXETINE HCL 60 MG PO CPEP
60.0000 mg | ORAL_CAPSULE | Freq: Two times a day (BID) | ORAL | Status: DC
  Administered 2021-11-11 – 2021-11-14 (×7): 60 mg via ORAL
  Filled 2021-11-10 (×7): qty 1

## 2021-11-10 MED ORDER — ONDANSETRON HCL 4 MG/2ML IJ SOLN
4.0000 mg | Freq: Four times a day (QID) | INTRAMUSCULAR | Status: DC | PRN
Start: 2021-11-10 — End: 2021-11-14

## 2021-11-10 MED ORDER — LEVOTHYROXINE SODIUM 50 MCG PO TABS
50.0000 ug | ORAL_TABLET | Freq: Every day | ORAL | Status: DC
  Administered 2021-11-11 – 2021-11-14 (×3): 50 ug via ORAL
  Filled 2021-11-10 (×4): qty 1

## 2021-11-10 MED ORDER — AZELASTINE HCL 0.1 % NA SOLN: 1.0000 | Freq: Every day | NASAL | Status: DC | PRN

## 2021-11-10 MED ORDER — VITAMIN D 25 MCG (1000 UNIT) PO TABS
2000.0000 [IU] | ORAL_TABLET | Freq: Every day | ORAL | Status: DC
  Administered 2021-11-11 – 2021-11-14 (×4): 2000 [IU] via ORAL
  Filled 2021-11-10 (×4): qty 2

## 2021-11-10 MED ORDER — ISOSORBIDE MONONITRATE ER 30 MG PO TB24
15.0000 mg | ORAL_TABLET | Freq: Every day | ORAL | Status: DC
  Administered 2021-11-11 – 2021-11-14 (×4): 15 mg via ORAL
  Filled 2021-11-10 (×4): qty 1

## 2021-11-10 MED ORDER — CALCIUM CARBONATE ANTACID 500 MG PO CHEW
500.0000 mg | CHEWABLE_TABLET | Freq: Every day | ORAL | Status: DC
  Administered 2021-11-11 – 2021-11-14 (×4): 500 mg via ORAL
  Filled 2021-11-10 (×4): qty 3

## 2021-11-10 MED ORDER — MORPHINE SULFATE (PF) 2 MG/ML IV SOLN
2.0000 mg | Freq: Once | INTRAVENOUS | Status: AC
  Administered 2021-11-10: 2 mg via INTRAVENOUS
  Filled 2021-11-10: qty 1

## 2021-11-10 MED ORDER — MEMANTINE HCL 10 MG PO TABS
10.0000 mg | ORAL_TABLET | Freq: Two times a day (BID) | ORAL | Status: DC
Start: 2021-11-11 — End: 2021-11-14
  Administered 2021-11-11 – 2021-11-14 (×6): 10 mg via ORAL
  Filled 2021-11-10 (×8): qty 1

## 2021-11-10 MED ORDER — FLUTICASONE PROPIONATE 50 MCG/ACT NA SUSP
1.0000 | Freq: Every day | NASAL | Status: DC | PRN
Start: 2021-11-10 — End: 2021-11-14

## 2021-11-10 MED ORDER — CARBAMAZEPINE ER 200 MG PO TB12
600.0000 mg | ORAL_TABLET | Freq: Two times a day (BID) | ORAL | Status: DC
  Administered 2021-11-11 – 2021-11-14 (×7): 600 mg via ORAL
  Filled 2021-11-10 (×9): qty 3

## 2021-11-10 MED ORDER — BEMPEDOIC ACID-EZETIMIBE 180-10 MG PO TABS: 180.0000 mg | ORAL_TABLET | Freq: Every day | ORAL | Status: DC

## 2021-11-10 MED ORDER — NITROGLYCERIN 0.4 MG SL SUBL: 0.4000 mg | SUBLINGUAL_TABLET | SUBLINGUAL | Status: DC | PRN

## 2021-11-10 NOTE — Progress Notes (Signed)
? ?Progress Note ? ?Patient Name: Rachel Clark ?Date of Encounter: 11/10/2021 ? ?[facility] Cardiologist: Peter [name], MD  ? ?Subjective  ? ?No chest pain or dyspnea. She is feeling well this am.  ? ?Inpatient Medications  ?  ?Scheduled Meds: ? aspirin  81 mg Oral Daily  ? Bempedoic Acid-Ezetimibe  180 mg Oral Daily  ? carbamazepine  600 mg Oral BID  ? cholecalciferol  2,000 Units Oral Daily  ? DULoxetine  60 mg Oral BID  ? ethosuximide  500 mg Oral BID  ? heparin  5,000 Units Subcutaneous Q8H  ? isosorbide mononitrate  15 mg Oral Daily  ? levothyroxine  50 mcg Oral QAC breakfast  ? memantine  10 mg Oral BID  ? pantoprazole  40 mg Oral BID  ? prasugrel  10 mg Oral Daily  ? sodium chloride flush  3 mL Intravenous Q12H  ? ?Continuous Infusions: ? sodium chloride    ? ?PRN Meds: ?sodium chloride, acetaminophen, azelastine, nitroGLYCERIN, ondansetron (ZOFRAN) IV, sodium chloride flush  ? ?Vital Signs  ?  ?Vitals:  ? 11/09/21 2358 11/10/21 0407 11/10/21 7353 11/10/21 2992  ?BP: 107/60 106/71    ?Pulse: (!) 104 97    ?Resp: '20 19 20 '$ (!) 21  ?Temp: 97.9 ?F (36.6 ?C) 97.8 ?F (36.6 ?C)    ?TempSrc: Oral Oral    ?SpO2: 99% 97%    ?Weight:      ?Height:      ? ? ?Intake/Output Summary (Last 24 hours) at 11/10/2021 0736 ?Last data filed at 11/09/2021 2000 ?Gross per 24 hour  ?Intake 250 ml  ?Output --  ?Net 250 ml  ? ? ?  11/09/2021  ?  6:21 PM 11/07/2021  ? 11:22 AM 10/20/2021  ? 11:00 AM  ?Last 3 Weights  ?Weight (lbs) 194 lb 3.6 oz 179 lb 179 lb  ?Weight (kg) 88.1 kg 81.194 kg 81.194 kg  ?   ? ?Telemetry  ?  ?Sinus, PVCs - Personally Reviewed ? ?ECG  ?  ?Sinus, chronic TWI - Personally Reviewed ? ?Physical Exam  ? ?GEN: No acute distress.   ?Neck: No JVD ?Cardiac: RRR, no murmurs, rubs, or gallops.  ?Respiratory: Clear to auscultation bilaterally. ?GI: Soft, nontender, non-distended  ?MS: No edema; No deformity. ?Neuro:  Nonfocal  ?Psych: Normal affect  ? ?Labs  ?  ?High Sensitivity Troponin:   ?Recent Labs  ?Lab  11/09/21 ?1454 11/09/21 ?1654 11/09/21 ?1947 11/10/21 ?0321 11/10/21 ?0500  ?TROPONINIHS 9 23* 41* 38* 31*  ?   ?Chemistry ?Recent Labs  ?Lab 11/09/21 ?1454 11/10/21 ?0321  ?NA 131* 132*  ?K 4.6 4.2  ?CL 101 103  ?CO2 24 21*  ?GLUCOSE 111* 114*  ?BUN 19 33*  ?CREATININE 0.73 0.80  ?CALCIUM 8.5* 8.1*  ?GFRNONAA >60 >60  ?ANIONGAP 6 8  ?  ?Lipids No results for input(s): CHOL, TRIG, HDL, LABVLDL, LDLCALC, CHOLHDL in the last 168 hours.  ?Hematology ?Recent Labs  ?Lab 11/09/21 ?1454 11/10/21 ?0321  ?WBC 7.6 7.0  ?RBC 4.07 3.24*  ?HGB 11.5* 9.4*  ?HCT 35.1* 28.1*  ?MCV 86.2 86.7  ?MCH 28.3 29.0  ?MCHC 32.8 33.5  ?RDW 13.2 13.2  ?PLT 333 269  ? ?Thyroid  ?Recent Labs  ?Lab 11/10/21 ?0321  ?TSH 1.782  ?  ?BNPNo results for input(s): BNP, PROBNP in the last 168 hours.  ?DDimer No results for input(s): DDIMER in the last 168 hours.  ? ?Radiology  ?  ?DG Chest 2 View ? ?Result Date:  11/09/2021 ?CLINICAL DATA:  Chest pain. EXAM: CHEST - 2 VIEW COMPARISON:  March 13, 2021. FINDINGS: The heart size and mediastinal contours are within normal limits. Status post coronary bypass graft. Left-sided pacemaker is unchanged in position. Both lungs are clear. The visualized skeletal structures are unremarkable. IMPRESSION: No active cardiopulmonary disease. Electronically Signed   By: Marijo Conception M.D.   On: 11/09/2021 15:23   ? ?Cardiac Studies  ? ? ? ?Patient Profile  ?   ?71 y.o. female with history of CAD (remote NSTEMI s/p PCI to RCA, s/p CABG 2007, subsequent failure of SVG-RCA by cath in 2016, DESx3 - full metal jacket- to RCA in 11/2015, NSTEMI 03/2021 with DESx3 to ramus intermedius), CHB/syncope s/p PPM in 03/2015, recurrent syncope/dizziness then felt due to orthostasis, GIB 03/2021 s/p transfusion with gastric antrum vessel s/p epi/clipping, HLD with statin intolerance, colon CA s/p resection and chemotherapy, seizures, memory loss, chronic hyponatremia, fibromyalgia, GERD, hypothyroidism, former smoker who was admitted  11/09/21 with chest pain. Troponin mildly abnormal with peak 41.  ? ?Assessment & Plan  ?  ?CAD with stable angina: Very mildly abnormal troponin at 41. She has no pain this am on Imdur. She is known to have diffuse CAD. I have reviewed her findings with her cardiologist Dr. [name] and we will not plan invasive evaluation at this time. Will discharge home today.  ? ?2.   Complete heart block s/p pacemaker:  ? ?3.   HLD: outpatient planning for [id number].  ? ? ?For questions or updates, please contact [facility] ?Please consult [email or web address] for contact info under  ? ?  ?   ?Signed, ?Lauree Chandler, MD  ?11/10/2021, 7:36 AM   ? ?

## 2021-11-10 NOTE — Telephone Encounter (Signed)
Spoke with pt's husband, Rachel Clark (ok per [place]) regarding recent hospital visit. Rachel Clark is concerned because cardiac work up was negative at hospital and pt was given imdur '15mg'$  once daily. Husband states that earlier today Imdur was relieving anginal pain but as day has progressed pt's pain is slowly starting to come back. Husband asks if pt can use SL nitroglycerin to relieve this pain. Explained to husband that imdur is the long acting version of nitroglycerin. Will route to MD to advise. Husband verbalizes understanding. ?

## 2021-11-10 NOTE — Telephone Encounter (Signed)
Pt c/o of Chest Pain: STAT if CP now or developed within 24 hours ? ?1. Are you having CP right now? Not sure. Patient is asleep  ? ?2. Are you experiencing any other symptoms (ex. SOB, nausea, vomiting, sweating)? no ? ?3. How long have you been experiencing CP? Two days  ? ?4. Is your CP continuous or coming and going? Comes and goes  ? ?5. Have you taken Nitroglycerin? no ? ?Husband wanted to know what the patient can take for her chset pain. The patient went to the ED yesterday and was admitted then discharged today.  ?

## 2021-11-10 NOTE — Plan of Care (Signed)

## 2021-11-10 NOTE — Telephone Encounter (Signed)
OK to use sl Ntg as well as long as BP isn't too low ? ?Massie Cogliano [name] MD, [facility] ? ?

## 2021-11-10 NOTE — ED Triage Notes (Signed)
Pt BIB EMS for chest pain. Discharged today about noon for same.  Pt took 2 nitro and 324 ASA at home  22 G in FA per EMS.   ?

## 2021-11-10 NOTE — H&P (Signed)
?Cardiology Admission History and Physical:  ? ?Patient ID: Rachel Clark ?MRN: [id number]; DOB: 1950-09-02  ? ?Admission date: 11/10/2021 ? ?PCP:  Harlan Stains, MD ?  ?[facility] Providers ?Cardiologist:  Peter [name], MD      ? ? ?Chief Complaint:  chest pain ? ?Patient Profile:  ? ?Rachel Clark is a 71 y.o. female with CAD (remote NSTEMI s/p PCI to RCA, s/p CABG 2007, subsequent failure of SVG-RCA by cath in 2016, DESx3 - full metal jacket- to RCA in 11/2015, NSTEMI 03/2021 with DESx3 to ramus intermedius), CHB/syncope s/p PPM in 03/2015, recurrent syncope/dizziness then felt due to orthostasis, GIB 03/2021 s/p transfusion with gastric antrum vessel s/p epi/clipping, HLD with statin intolerance, colon CA s/p resection and chemotherapy, seizures, memory loss, chronic hyponatremia, fibromyalgia, GERD, hypothyroidism, former smoker who is being seen 11/09/2021 for the evaluation of chest pain. ? ?History of Present Illness:  ? ?Ms. Collet is seen at bedside and reports chest pain free. However, when her husband said to her "you just told me you had chest pain", the patient said to me "yes, I am having chest pain:. Due to her memory loss it is difficult to acquire accurate history. She was just discharged this morning from cardiology service. Of note, she presented on 11/09/21 with c/o 2 episodes chest pain. Her ischemic workup was not impressive. Given her known diffuse CAD, patient was discharged home with imdur '15mg'$ . ? ?Her husband at bedside said patient complained chest pain late this afternoon which resolved with 4 x '81mg'$  ASA and 2 nitro that were given by her husband at home.  ? ?Currently, patient is on room air, no acute distress, HDS. ? ?On admission, ?K 4.2, Cr 0.83, troponin 27->259, Hgb 9.4 at her baseline ?ECG '@11'$ -DDU-2025 18:45:34 ST, STD lateral leads, QT prolonged ?ECG '@11'$ -May-2023 22:09:55, SR, STD lateral leads improved, PVC ? ?Past Medical History:  ?Diagnosis Date  ? Arthritis   ?  "fingers" (06/29/2016)  ? Chronic lower back pain   ? Colon cancer ([place]) 12/04/2011  ? s/p Laparoscopic-assisted transverse colectomy on 12/19/2011 by Dr. Donne Hazel.  pT3 N0 M0.   ? Complete heart block ([place])   ? Coronary artery disease   ? Depression   ? Dyslipidemia   ? Fibromyalgia   ? Gastric ulcer   ? GERD (gastroesophageal reflux disease)   ? GI bleed   ? 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 last seizure was in 1972 ;takes Tegretol (06/29/2016)   ? Headache   ? "weekly" (06/29/2016)  ? Hyperlipidemia   ? Hyponatremia   ? Hypothyroid   ? Iron deficiency anemia 11/17/2011  ? Memory change 11/13/2016  ? Monoclonal gammopathy of unknown significance   ? Orthostatic hypotension   ? Presence of permanent cardiac pacemaker   ? Syncope and collapse   ? pacemaker implanted  ? ? ?Past Surgical History:  ?Procedure Laterality Date  ? ANTERIOR CERVICAL DECOMP/DISCECTOMY FUSION  1980's  ? BACK SURGERY    ? CARDIAC CATHETERIZATION  05/20/2009  ? obstructive native vessel disease in LAD, RCA, and first diagonal, patent vein graft to distal RCA and LIMA to LAD,normal. ef 60%  ? CARDIAC CATHETERIZATION N/A 03/24/2015  ? Procedure: Left Heart Cath and Cors/Grafts Angiography;  Surgeon: Wellington Hampshire, MD;  Location: [facility];  Service: Cardiovascular;  Laterality: N/A;  ? CARDIAC CATHETERIZATION N/A 07/28/2015  ? Procedure: Left Heart Cath and Coronary Angiography;  Surgeon: Troy Sine, MD;  Location: [facility];  Service: Cardiovascular;  Laterality: N/A;  ? CARDIAC CATHETERIZATION N/A 11/09/2015  ? Procedure: Coronary Stent Intervention;  Surgeon: Sherren Mocha, MD;  Location: [facility];  Service: Cardiovascular;  Laterality: N/A;  ? CARDIAC CATHETERIZATION N/A 11/09/2015  ? Procedure: Left Heart Cath and Coronary Angiography;  Surgeon: Sherren Mocha, MD;  Location: [facility] [facility];  Service: Cardiovascular;  Laterality: N/A;  ? CARDIAC CATHETERIZATION N/A 06/29/2016  ? Procedure:  Left Heart Cath and Coronary Angiography;  Surgeon: Nelva Bush, MD;  Location: [place] [facility];  Service: Cardiovascular;  Laterality: N/A;  ? CARDIAC CATHETERIZATION N/A 06/29/2016  ? Procedure: Intravascular Pressure Wire/FFR Study;  Surgeon: Nelva Bush, MD;  Location: [place] [facility];  Service: Cardiovascular;  Laterality: N/A;  ? CARDIAC CATHETERIZATION N/A 06/29/2016  ? Procedure: Coronary Balloon Angioplasty;  Surgeon: Nelva Bush, MD;  Location: [facility] [facility];  Service: Cardiovascular;  Laterality: N/A;  ? COLON RESECTION  12/19/2011  ? Procedure: COLON RESECTION LAPAROSCOPIC;  Surgeon: Rolm Bookbinder, MD;  Location: [facility];  Service: General;  Laterality: N/A;  laparoscopic hand assisted partial colon resection  ? COLON SURGERY    ? COLONOSCOPY    ? CORONARY ANGIOPLASTY WITH STENT PLACEMENT  ~ 2007  ? 1 stent  ? CORONARY ARTERY BYPASS GRAFT  2007  ? "CABG X2"  ? [place]  ? EP IMPLANTABLE DEVICE N/A 03/25/2015  ? MDT Advisa DR pacemaker implanted by Dr Rayann Heman for transient complete heart block and syncope  ? ESOPHAGOGASTRODUODENOSCOPY (EGD) WITH PROPOFOL N/A 03/14/2021  ? Procedure: ESOPHAGOGASTRODUODENOSCOPY (EGD) WITH PROPOFOL;  Surgeon: Ronnette Juniper, MD;  Location: [place] [facility];  Service: Gastroenterology;  Laterality: N/A;  ? HEMOSTASIS CLIP PLACEMENT  03/14/2021  ? Procedure: HEMOSTASIS CLIP PLACEMENT;  Surgeon: Ronnette Juniper, MD;  Location: [place];  Service: Gastroenterology;;  ? INSERT / REPLACE / REMOVE PACEMAKER    ? LEFT HEART CATH AND CORS/GRAFTS ANGIOGRAPHY N/A 02/21/2018  ? Procedure: LEFT HEART CATH AND CORS/GRAFTS ANGIOGRAPHY;  Surgeon: Lorretta Harp, MD;  Location: [place] [facility];  Service: Cardiovascular;  Laterality: N/A;  ? LEFT HEART CATH AND CORS/GRAFTS ANGIOGRAPHY N/A 03/08/2021  ? Procedure: LEFT HEART CATH AND CORS/GRAFTS ANGIOGRAPHY;  Surgeon: [name], Peter M, MD;  Location: [place] [facility];  Service: Cardiovascular;   Laterality: N/A;  ? PORT-A-CATH REMOVAL N/A 06/12/2013  ? Procedure: REMOVAL PORT-A-CATH;  Surgeon: Rolm Bookbinder, MD;  Location: [facility] [place];  Service: General;  Laterality: N/A;  ? PORTACATH PLACEMENT  06/04/2012  ? Procedure: INSERTION PORT-A-CATH;  Surgeon: Rolm Bookbinder, MD;  Location: [facility];  Service: General;  Laterality: N/A;  Insertion of port-a-cath   ? POSTERIOR LAMINECTOMY / DECOMPRESSION CERVICAL SPINE  1990s  ? SCLEROTHERAPY  03/14/2021  ? Procedure: SCLEROTHERAPY;  Surgeon: Ronnette Juniper, MD;  Location: [place];  Service: Gastroenterology;;  ? VAGINAL HYSTERECTOMY    ?  ? ?Medications Prior to Admission: ?Prior to Admission medications   ?Medication Sig Start Date End Date Taking? Authorizing Provider  ?acetaminophen (TYLENOL) 500 MG tablet Take 500-1,000 mg by mouth daily as needed for mild pain or headache.    [provider]  ?aspirin 81 MG chewable tablet Chew 1 tablet (81 mg total) by mouth daily. 06/30/16   Arbutus Leas, NP  ?Azelastine HCl 0.15 % SOLN Place 1 spray into both nostrils daily as needed (seasonal allergies). 12/22/14   [provider]  ?Bempedoic Acid-Ezetimibe (NEXLIZET) 180-10 MG  TABS Take 180 mg by mouth daily. 12/29/20   [name], Peter M, MD  ?calcium carbonate (TUMS - DOSED IN MG ELEMENTAL CALCIUM) 500 MG chewable tablet Chew 500 mg by mouth daily.    [provider]  ?carbamazepine (TEGRETOL XR) 200 MG 12 hr tablet Take 3 tablets (600 mg total) by mouth 2 (two) times daily. 09/13/21 12/07/22  Penumalli, Earlean Polka, MD  ?Cholecalciferol (VITAMIN D) 50 MCG (2000 UT) CAPS Take 2,000 Units by mouth daily.    [provider]  ?diphenhydrAMINE HCl (BENADRYL ALLERGY PO) Take 1 tablet by mouth daily as needed (allergy).    [provider]  ?diphenhydramine-acetaminophen (TYLENOL PM) 25-500 MG TABS tablet Take 1 tablet by mouth at bedtime.    [provider]  ?DULoxetine (CYMBALTA) 60 MG capsule Take 60 mg by mouth  2 (two) times daily.    [provider]  ?ethosuximide (ZARONTIN) 250 MG capsule Take 2 capsules (500 mg total) by mouth 2 (two) times daily. 09/13/21 12/07/22  Penumalli, Earlean Polka, MD  ?Evolocumab with

## 2021-11-10 NOTE — Progress Notes (Signed)
Pt has no chest pain, no distress noted tonight. She is able to rest well. Her hemodynamics remain stable.  ? ?She is able to walk to the restroom independently with a staff supervision. Normal sinus rhythm with frequent PVCs and some bigeminies on the monitor, HR at rest is 90s-100. HR 130s when she is ambulating. Pt denies feeling of heart palpitation or dizziness. BP remains stable.  ? ?Pt's husband requests a staff nurse to call for an update lab result. All questions are answered. He expresses appreciations after getting update.  ? ?We will continue to monitor. ? ?Kennyth Lose, RN ?

## 2021-11-10 NOTE — ED Provider Triage Note (Signed)
Emergency Medicine Provider Triage Evaluation Note ? ?Rachel Clark , a 71 y.o. female  was evaluated in triage.  Pt complains of chest pain.  Resolved after taking nitro and aspirin.  Patient was admitted and discharged earlier today.  Imdur was added to her therapy.  When she was discharged she was chest pain-free.  This started 2 hours after discharge. ? ?Review of Systems  ?Positive: As above ?Negative: As above ? ?Physical Exam  ?BP 112/68 (BP Location: Right Arm)   Pulse (!) 115   Temp 98.3 ?F (36.8 ?C) (Oral)   Resp 18   SpO2 100%  ?Gen:   Awake, no distress   ?Resp:  Normal effort  ?MSK:   Moves extremities without difficulty  ?Other:   ? ?Medical Decision Making  ?Medically screening exam initiated at 6:56 PM.  Appropriate orders placed.  Rachel Clark was informed that the remainder of the evaluation will be completed by another provider, this initial triage assessment does not replace that evaluation, and the importance of remaining in the ED until their evaluation is complete. ? ?  ?Evlyn Courier, PA-C ?11/10/21 1857 ? ?

## 2021-11-10 NOTE — ED Provider Notes (Signed)
?[facility] ?Provider Note ? ? ?CSN: [id number] ?Arrival date & time: 11/10/21  1835 ? ?  ? ?History ? ?Chief Complaint  ?Patient presents with  ? Chest Pain  ? ? ? ? ?Chest Pain ?Associated symptoms: no fever, no nausea, no palpitations, no shortness of breath and no vomiting   ? ?Rachel Clark is a 71 y.o. female with CAD (remote NSTEMI s/p PCI to RCA, s/p CABG 2007, subsequent failure of SVG-RCA by cath in 2016, DESx3 - full metal jacket- to RCA in 11/2015, NSTEMI 03/2021 with DESx3 to ramus intermedius), CHB/syncope s/p PPM in 03/2015, prior GI bleed, HLD, colon CA s/p resection and chemotherapy, seizures, memory loss, chronic hyponatremia, fibromyalgia, GERD, hypothyroidism presenting to the ED with chest pain.  Patient was discharged earlier today from the cardiology service after being admitted for persistent chest pain.  Unfortunately, she is not a candidate for invasive evaluation to the recommended medical management (she was initiated on Imdur during this admission).  This afternoon, patient had recurrence of her chest pain and it was much worse than prior.  She describes it as a pressure pain in the center of her chest as well as a "gnawing pain".  She denies any associated diaphoresis, nausea, or shortness of breath.  She currently rates her pain at a 5/10.  Prior to arrival, she did take 2 nitroglycerin with some improvement in her pain as well as 324 mg of aspirin. ?  ? ?Home Medications ?Prior to Admission medications   ?Medication Sig Start Date End Date Taking? Authorizing Provider  ?acetaminophen (TYLENOL) 500 MG tablet Take 500-1,000 mg by mouth daily as needed for mild pain or headache.    [provider]  ?aspirin 81 MG chewable tablet Chew 1 tablet (81 mg total) by mouth daily. 06/30/16   Arbutus Leas, NP  ?Azelastine HCl 0.15 % SOLN Place 1 spray into both nostrils daily as needed (seasonal allergies). 12/22/14   [provider]   ?Bempedoic Acid-Ezetimibe (NEXLIZET) 180-10 MG TABS Take 180 mg by mouth daily. 12/29/20   [name], Peter M, MD  ?calcium carbonate (TUMS - DOSED IN MG ELEMENTAL CALCIUM) 500 MG chewable tablet Chew 500 mg by mouth daily.    [provider]  ?carbamazepine (TEGRETOL XR) 200 MG 12 hr tablet Take 3 tablets (600 mg total) by mouth 2 (two) times daily. 09/13/21 12/07/22  Penumalli, Earlean Polka, MD  ?Cholecalciferol (VITAMIN D) 50 MCG (2000 UT) CAPS Take 2,000 Units by mouth daily.    [provider]  ?diphenhydrAMINE HCl (BENADRYL ALLERGY PO) Take 1 tablet by mouth daily as needed (allergy).    [provider]  ?diphenhydramine-acetaminophen (TYLENOL PM) 25-500 MG TABS tablet Take 1 tablet by mouth at bedtime.    [provider]  ?DULoxetine (CYMBALTA) 60 MG capsule Take 60 mg by mouth 2 (two) times daily.    [provider]  ?ethosuximide (ZARONTIN) 250 MG capsule Take 2 capsules (500 mg total) by mouth 2 (two) times daily. 09/13/21 12/07/22  Penumalli, Earlean Polka, MD  ?Evolocumab with Infusor ([place]) 420 MG/3.5ML SOCT Inject 420 mg into the skin every 30 (thirty) days. ?Patient not taking: Reported on 11/09/2021 11/07/21   [name], Peter M, MD  ?Fluticasone Propionate [facility] ALLERGY RELIEF NA) Place 1 spray into the nose daily as needed (allergy relief).    [provider]  ?isosorbide mononitrate (IMDUR) 30 MG 24 hr tablet Take 0.5 tablets (15 mg total) by mouth  daily. 11/11/21   Margie Billet, NP  ?levothyroxine (SYNTHROID, LEVOTHROID) 50 MCG tablet Take 50 mcg by mouth daily before breakfast.    [provider]  ?memantine (NAMENDA) 10 MG tablet Take 1 tablet (10 mg total) by mouth 2 (two) times daily. 09/13/21   Penumalli, Earlean Polka, MD  ?nitroGLYCERIN (NITROSTAT) 0.4 MG SL tablet Place 1 tablet (0.4 mg total) under the tongue every 5 (five) minutes as needed. For chest pain. 09/10/20   [name], Peter M, MD  ?pantoprazole (PROTONIX) 40 MG tablet Take 1  tablet (40 mg total) by mouth 2 (two) times daily. AFTER 2 MONTHS CONT AS ONCE A DAY ?Patient taking differently: Take 40 mg by mouth daily. 03/17/21 11/09/21  Antonieta Pert, MD  ?prasugrel (EFFIENT) 10 MG TABS tablet Take 1 tablet (10 mg total) by mouth daily. 03/10/21   Jerline Pain, MD  ?   ? ?Allergies    ?Sulfamethoxazole, Aspirin, Crestor [rosuvastatin], Erythromycin, Lisinopril, Niacin and related, Penicillin g benzathine, Statins, Sulfa drugs cross reactors, Tetracyclines & related, Xeloda [capecitabine], Penicillins, and Plavix [clopidogrel bisulfate]   ? ?Review of Systems   ?Review of Systems  ?Constitutional:  Negative for fever.  ?Respiratory:  Negative for shortness of breath.   ?Cardiovascular:  Positive for chest pain. Negative for palpitations.  ?Gastrointestinal:  Negative for nausea and vomiting.  ?Neurological:  Negative for syncope.  ? ?Physical Exam ?Updated Vital Signs ?BP 140/78   Pulse 94   Temp 98.3 ?F (36.8 ?C) (Oral)   Resp 16   SpO2 99%  ?Physical Exam ?Constitutional:   ?   General: She is not in acute distress. ?   Appearance: She is not toxic-appearing or diaphoretic.  ?   Comments: Elderly and chronically ill appearing.  ?HENT:  ?   Head: Normocephalic and atraumatic.  ?Cardiovascular:  ?   Rate and Rhythm: Normal rate. Rhythm irregular.  ?   Pulses:     ?     Radial pulses are 2+ on the right side and 2+ on the left side.  ?   Heart sounds: Normal heart sounds. No murmur heard. ?  No friction rub. No gallop.  ?Pulmonary:  ?   Effort: Pulmonary effort is normal. No tachypnea or respiratory distress.  ?   Breath sounds: Normal breath sounds. No decreased breath sounds, wheezing, rhonchi or rales.  ?Chest:  ?   Chest wall: No tenderness.  ?Abdominal:  ?   Palpations: Abdomen is soft.  ?   Tenderness: There is no abdominal tenderness. There is no guarding or rebound.  ?Musculoskeletal:  ?   Cervical back: Neck supple.  ?   Right lower leg: No edema.  ?   Left lower leg: No edema.   ?Skin: ?   General: Skin is warm and dry.  ?Neurological:  ?   General: No focal deficit present.  ?   Mental Status: She is alert.  ? ? ?ED Results / Procedures / Treatments   ?Labs ?(all labs ordered are listed, but only abnormal results are displayed) ?Labs Reviewed  ?BASIC METABOLIC PANEL - Abnormal; Notable for the following components:  ?    Result Value  ? Sodium 132 (*)   ? Glucose, Bld 132 (*)   ? BUN 26 (*)   ? Calcium 8.6 (*)   ? All other components within normal limits  ?CBC - Abnormal; Notable for the following components:  ? RBC 3.38 (*)   ? Hemoglobin 9.4 (*)   ? HCT  29.3 (*)   ? All other components within normal limits  ?TROPONIN I (HIGH SENSITIVITY) - Abnormal; Notable for the following components:  ? Troponin I (High Sensitivity) 27 (*)   ? All other components within normal limits  ?TROPONIN I (HIGH SENSITIVITY) - Abnormal; Notable for the following components:  ? Troponin I (High Sensitivity) 259 (*)   ? All other components within normal limits  ? ? ?EKG ?None ? ?Radiology ?DG Chest 2 View ? ?Result Date: 11/10/2021 ?CLINICAL DATA:  Chest pain. Recent hospitalization, discharged today for same. EXAM: CHEST - 2 VIEW COMPARISON:  Radiograph yesterday. FINDINGS: Post median sternotomy. Left-sided pacemaker in place. Normal heart size with stable mediastinal contours. Aortic atherosclerosis. Coronary artery calcifications/stent visualized. There is no pulmonary edema, pleural effusion, focal airspace disease or pneumothorax. No acute osseous findings. IMPRESSION: 1. No acute chest findings. 2. Post median sternotomy with left-sided pacemaker in place. Electronically Signed   By: Keith Rake M.D.   On: 11/10/2021 19:55  ? ?DG Chest 2 View ? ?Result Date: 11/09/2021 ?CLINICAL DATA:  Chest pain. EXAM: CHEST - 2 VIEW COMPARISON:  March 13, 2021. FINDINGS: The heart size and mediastinal contours are within normal limits. Status post coronary bypass graft. Left-sided pacemaker is unchanged in  position. Both lungs are clear. The visualized skeletal structures are unremarkable. IMPRESSION: No active cardiopulmonary disease. Electronically Signed   By: Marijo Conception M.D.   On: 11/09/2021 15:23   ? ?Procedures ?

## 2021-11-10 NOTE — Progress Notes (Signed)
Patient given discharge instructions and stated understanding. 

## 2021-11-10 NOTE — Discharge Summary (Signed)
?Discharge Summary  ?  ?Patient ID: Rachel Clark ?MRN: [id number]; DOB: 1950-09-03 ? ?Admit date: 11/09/2021 ?Discharge date: 11/10/2021 ? ?PCP:  Harlan Stains, MD ?  ?[facility] Providers ?Cardiologist:  Peter [name], MD   { ? ? ?Discharge Diagnoses  ?  ?Principal Problem: ?  Chest pain ?Active Problems: ?  Coronary artery disease, post CABG 2007  ?  Dyslipidemia ?  Cardiac pacemaker in situ ? ? ? ?Diagnostic Studies/Procedures  ?  ?N/A  ?_____________ ?  ?History of Present Illness   ?  ?Per admission H&P on 11/09/21: ? ?Rachel Clark is a 71 y.o. female with CAD (remote NSTEMI s/p PCI to RCA, s/p CABG 2007, subsequent failure of SVG-RCA by cath in 2016, DESx3 - full metal jacket- to RCA in 11/2015, NSTEMI 03/2021 with DESx3 to ramus intermedius), CHB/syncope s/p PPM in 03/2015, recurrent syncope/dizziness then felt due to orthostasis, GIB 03/2021 s/p transfusion with gastric antrum vessel s/p epi/clipping, HL with statin intolerance, colon CA s/p resection and chemotherapy, seizures, memory loss, chronic hyponatremia, fibromyalgia, GERD, hypothyroidism, former smoker who is being seen 11/09/2021 for the evaluation of chest pain. ? ?[name] with above complex PMH . It has been felt she requires permissive BP and salt liberalization due to h/o orthostasis and syncope. Last cath is outlined below with DESx3 to RI. She was placed on Effient due to interaction of Brilinta with Tegretol and history of rash on Plavix. Indefinite DAPT was recommended due to extensive stents. A week after her PCI she had been readmitted with acute upper GI bleed with coffee ground emesis. Hgb dropped to 7.7 then to 6.5. she was transfused multiple units of PRBCs. DAPT was continued given recent stents. EGD showed a visible vessel in the gastric antrum treated with epinephrine injection and clipping. Treated with PPI. Bleeding scan was subsequently negative. Hgb improved to 9.6 at discharge. She was recently referred to lipid  clinic for LDL 124 despite Nexlizet -> paperwork started for [facility]. Last device interrogation 10/10/21 was normal. ?  ?She was accompanied by her husband 11/09/21 who brought her in due to 2 separate instances of chest pain. She otherwise has been in her USOH lately, doing reasonably well, except with chronic lightheadedness. Unfortunately the patient's memory loss makes it difficult for her to give many details about these events; her husband only heard about them after the occurrences. She had an episode of chest pain and took 1 SL NTG and ASA with improvement. They checked her BP and it was initially lower than usual but they do not recall the reading. A recheck confirmed it came back to baseline soon after. The rest of the evening was uneventful until a recurrent spell this morning, relieved with 1 SL NTG and ASA. She is unable to say how long it lasted - states "honestly, I couldn't tell you anything about the episode" - has chronic memory loss. She could not recall having any additional symptoms with the chest pain or recently otherwise. Here she has been chest pain free. EKG shows borderline sinus tach with NSST changes similar to prior. Labs pertinent for Na 131 (known to be intermittently low), Hgb 11.5 (previously 13.8 in 10/2021 then 9.6 before that in 03/2021), hsTroponin neg x 1. CXR NAD. ?  ? ? ?Hospital Course  ?   ?Consultants: N/A  ? ?CAD with stable angina ?- presented with chest pain and chronic lightheadedness ?- Hs trop 23 >41 >38 >31 ?- EKG non-acute  ?- likely due to  known diffuse CAD.Case was reviewed with patient primary cardiologist Dr. [name], no plan for invasive evaluation at this time.  ?- medical therapy: continue ASA+ effient; not on beta blocker historically due to orthostatic hypotension and chronic dizziness, will not add now; started Imdur '15mg'$  this admission with good angina relief/will continue/BP tolerating; intolerant to statin which will refer to outpatient lipid clinic  ?   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  ?HLD ?- on Nexlizet,  outpatient planning for [id number], referral made  ? ?H/o orthostatic hypotension and chronic dizziness ?H/O GIB 03/2021 ?Seizure ?- no change of home meds ? ?Did the patient have an acute coronary syndrome (MI, NSTEMI, STEMI, etc) this admission?:  No                               ?Did the patient have a percutaneous coronary intervention (stent / angioplasty)?:  No.   ? ?   ? ?  ?_____________ ? ?Discharge Vitals ?Blood pressure 117/67, pulse 97, temperature 97.8 ?F (36.6 ?C), temperature source Oral, resp. rate 17, height '5\' 7"'$  (1.702 m), weight 88.1 kg, SpO2 98 %.  Rachel Clark Weights  ? 11/09/21 1821  ?Weight: 88.1 kg  ? ? ?Labs & Radiologic Studies  ?  ?CBC ?Recent Labs  ?  11/09/21 ?1454 11/10/21 ?0321  ?WBC 7.6 7.0  ?HGB 11.5* 9.4*  ?HCT 35.1* 28.1*  ?MCV 86.2 86.7  ?PLT 333 269  ? ?Basic Metabolic Panel ?Recent Labs  ?  11/09/21 ?1454 11/10/21 ?0321  ?NA 131* 132*  ?K 4.6 4.2  ?CL 101 103  ?CO2 24 21*  ?GLUCOSE 111* 114*  ?BUN 19 33*  ?CREATININE 0.73 0.80  ?CALCIUM 8.5* 8.1*  ? ?Liver Function Tests ?No results for input(s): AST, ALT, ALKPHOS, BILITOT, PROT, ALBUMIN in the last 72 hours. ?No results for input(s): LIPASE, AMYLASE in the last 72 hours. ?High Sensitivity Troponin:   ?Recent Labs  ?Lab 11/09/21 ?1454 11/09/21 ?1654 11/09/21 ?1947 11/10/21 ?0321 11/10/21 ?0500  ?TROPONINIHS 9 23* 41* 38* 31*  ?  ?BNP ?Invalid input(s): POCBNP ?D-Dimer ?No results for input(s): DDIMER in the last 72 hours. ?Hemoglobin A1C ?No results for input(s): HGBA1C in the last 72 hours. ?Fasting Lipid Panel ?No results for input(s): CHOL, HDL, LDLCALC, TRIG, CHOLHDL, LDLDIRECT in the last 72 hours. ?Thyroid Function Tests ?Recent Labs  ?  11/10/21 ?0321  ?TSH 1.782  ? ?_____________  ?DG Chest 2 View ? ?Result Date: 11/09/2021 ?CLINICAL DATA:  Chest pain. EXAM: CHEST - 2 VIEW COMPARISON:  March 13, 2021. FINDINGS: The heart size  and mediastinal contours are within normal limits. Status post coronary bypass graft. Left-sided pacemaker is unchanged in position. Both lungs are clear. The visualized skeletal structures are unremarkable. IMPRESSION: No active cardiopulmonary disease. Electronically Signed   By: Marijo Conception M.D.   On: 11/09/2021 15:23   ? ?Disposition  ? ?All discharge instruction given at bedside, all questions answered to satisfaction. Patient is agreeable with medication change and follow up plan. See rounding notes today for exam per attending. Pt is being discharged home today in good condition. ? ?Follow-up Plans & Appointments  ? ? ? Follow-up Information   ? ? [name], PA Follow up on 12/02/2021.   ?Specialties: Physician Assistant, Cardiology, Radiology ?Why: at 10:05 AM for your cardiology follow up appointment ?Contact information: ?[address] ?[address] ?[place] [address] ?  [phone] ? ? ?  ?  ? ?  ?  ? ?  ? ?Discharge Instructions   ? ? AMB Referral to [facility]   Complete by: As directed ?  ? Reason for referral: Patients with statin intolerance (failed 2 statins, one of which must be a high potency statin)  ? Internal Lipid Clinic Referral Scheduling ? ?Internal lipid clinic referrals are providers within [facility], who wish to refer established patients for routine management (help in starting PCSK9 inhibitor therapy) or advanced therapies. ? ?Internal MD referral criteria: ? ?            1. All patients with LDL>190 mg/dL ? 2. All patients with Triglycerides >500 mg/dL ? 3. Patients with suspected or confirmed heterozygous familial hyperlipidemia (HeFH) or homozygous familial hyperlipidemia (HoFH) ? 4. Patients with family history of suspicious for genetic dyslipidemia desiring genetic testing ? 5. Patients refractory to standard guideline based therapy ? 6. Patients with statin intolerance (failed 2 statins, one of which must be a high potency statin) ? 7. Patients who  the provider desires to be seen by MD ? ? ?Internal PharmD referral criteria: ? ? 1. Follow-up patients for medication management ? 2. Follow-up for compliance monitoring ? 3. Patients for drug education ? 4.

## 2021-11-11 ENCOUNTER — Encounter (HOSPITAL_COMMUNITY): Payer: Self-pay | Admitting: Internal Medicine

## 2021-11-11 ENCOUNTER — Observation Stay (HOSPITAL_COMMUNITY): Payer: Medicare Other

## 2021-11-11 ENCOUNTER — Inpatient Hospital Stay (HOSPITAL_COMMUNITY)
Admission: EM | Disposition: A | Discharge status: Home / Self Care | Payer: Self-pay | Source: Home / Self Care | Attending: Cardiology

## 2021-11-11 DIAGNOSIS — I25119 Atherosclerotic heart disease of native coronary artery with unspecified angina pectoris: Secondary | ICD-10-CM | POA: Diagnosis not present

## 2021-11-11 DIAGNOSIS — F32A Depression, unspecified: Secondary | ICD-10-CM | POA: Diagnosis present

## 2021-11-11 DIAGNOSIS — K922 Gastrointestinal hemorrhage, unspecified: Secondary | ICD-10-CM | POA: Diagnosis not present

## 2021-11-11 DIAGNOSIS — T82855A Stenosis of coronary artery stent, initial encounter: Secondary | ICD-10-CM

## 2021-11-11 DIAGNOSIS — I10 Essential (primary) hypertension: Secondary | ICD-10-CM | POA: Diagnosis present

## 2021-11-11 DIAGNOSIS — K2970 Gastritis, unspecified, without bleeding: Secondary | ICD-10-CM | POA: Diagnosis not present

## 2021-11-11 DIAGNOSIS — I252 Old myocardial infarction: Secondary | ICD-10-CM | POA: Diagnosis not present

## 2021-11-11 DIAGNOSIS — Z87891 Personal history of nicotine dependence: Secondary | ICD-10-CM | POA: Diagnosis not present

## 2021-11-11 DIAGNOSIS — I25110 Atherosclerotic heart disease of native coronary artery with unstable angina pectoris: Secondary | ICD-10-CM

## 2021-11-11 DIAGNOSIS — K219 Gastro-esophageal reflux disease without esophagitis: Secondary | ICD-10-CM | POA: Diagnosis present

## 2021-11-11 DIAGNOSIS — G40409 Other generalized epilepsy and epileptic syndromes, not intractable, without status epilepticus: Secondary | ICD-10-CM | POA: Diagnosis present

## 2021-11-11 DIAGNOSIS — R413 Other amnesia: Secondary | ICD-10-CM | POA: Diagnosis present

## 2021-11-11 DIAGNOSIS — K254 Chronic or unspecified gastric ulcer with hemorrhage: Secondary | ICD-10-CM | POA: Diagnosis present

## 2021-11-11 DIAGNOSIS — G8929 Other chronic pain: Secondary | ICD-10-CM | POA: Diagnosis present

## 2021-11-11 DIAGNOSIS — I495 Sick sinus syndrome: Secondary | ICD-10-CM | POA: Diagnosis present

## 2021-11-11 DIAGNOSIS — K259 Gastric ulcer, unspecified as acute or chronic, without hemorrhage or perforation: Secondary | ICD-10-CM | POA: Diagnosis not present

## 2021-11-11 DIAGNOSIS — Z950 Presence of cardiac pacemaker: Secondary | ICD-10-CM | POA: Diagnosis not present

## 2021-11-11 DIAGNOSIS — I214 Non-ST elevation (NSTEMI) myocardial infarction: Secondary | ICD-10-CM

## 2021-11-11 DIAGNOSIS — E871 Hypo-osmolality and hyponatremia: Secondary | ICD-10-CM | POA: Diagnosis present

## 2021-11-11 DIAGNOSIS — K2900 Acute gastritis without bleeding: Secondary | ICD-10-CM | POA: Diagnosis not present

## 2021-11-11 DIAGNOSIS — D62 Acute posthemorrhagic anemia: Secondary | ICD-10-CM | POA: Diagnosis not present

## 2021-11-11 DIAGNOSIS — Z951 Presence of aortocoronary bypass graft: Secondary | ICD-10-CM | POA: Diagnosis not present

## 2021-11-11 DIAGNOSIS — Z66 Do not resuscitate: Secondary | ICD-10-CM | POA: Diagnosis present

## 2021-11-11 DIAGNOSIS — E785 Hyperlipidemia, unspecified: Secondary | ICD-10-CM | POA: Diagnosis present

## 2021-11-11 DIAGNOSIS — Z955 Presence of coronary angioplasty implant and graft: Secondary | ICD-10-CM

## 2021-11-11 DIAGNOSIS — Z7902 Long term (current) use of antithrombotics/antiplatelets: Secondary | ICD-10-CM | POA: Diagnosis not present

## 2021-11-11 DIAGNOSIS — M797 Fibromyalgia: Secondary | ICD-10-CM | POA: Diagnosis present

## 2021-11-11 DIAGNOSIS — Z981 Arthrodesis status: Secondary | ICD-10-CM | POA: Diagnosis not present

## 2021-11-11 DIAGNOSIS — D509 Iron deficiency anemia, unspecified: Secondary | ICD-10-CM | POA: Diagnosis not present

## 2021-11-11 DIAGNOSIS — D472 Monoclonal gammopathy: Secondary | ICD-10-CM | POA: Diagnosis present

## 2021-11-11 DIAGNOSIS — E039 Hypothyroidism, unspecified: Secondary | ICD-10-CM | POA: Diagnosis present

## 2021-11-11 DIAGNOSIS — D500 Iron deficiency anemia secondary to blood loss (chronic): Secondary | ICD-10-CM | POA: Diagnosis present

## 2021-11-11 DIAGNOSIS — R079 Chest pain, unspecified: Secondary | ICD-10-CM | POA: Diagnosis present

## 2021-11-11 DIAGNOSIS — Y831 Surgical operation with implant of artificial internal device as the cause of abnormal reaction of the patient, or of later complication, without mention of misadventure at the time of the procedure: Secondary | ICD-10-CM | POA: Diagnosis present

## 2021-11-11 DIAGNOSIS — Z85038 Personal history of other malignant neoplasm of large intestine: Secondary | ICD-10-CM | POA: Diagnosis not present

## 2021-11-11 HISTORY — PX: LEFT HEART CATH AND CORONARY ANGIOGRAPHY: CATH118249

## 2021-11-11 LAB — BASIC METABOLIC PANEL
Anion gap: 6 (ref 5–15)
BUN: 17 mg/dL (ref 8–23)
CO2: 25 mmol/L (ref 22–32)
Calcium: 8.3 mg/dL — ABNORMAL LOW (ref 8.9–10.3)
Chloride: 101 mmol/L (ref 98–111)
Creatinine, Ser: 0.74 mg/dL (ref 0.44–1.00)
GFR, Estimated: >60 mL/min (ref >60)
Glucose, Bld: 115 mg/dL — ABNORMAL HIGH (ref 70–99)
Potassium: 4.3 mmol/L (ref 3.5–5.1)
Sodium: 132 mmol/L — ABNORMAL LOW (ref 135–145)

## 2021-11-11 LAB — CBC
HCT: 25.8 % — ABNORMAL LOW (ref 36.0–46.0)
Hemoglobin: 8.6 g/dL — ABNORMAL LOW (ref 12.0–15.0)
MCH: 28.9 pg (ref 26.0–34.0)
MCHC: 33.3 g/dL (ref 30.0–36.0)
MCV: 86.6 fL (ref 80.0–100.0)
Platelets: 261 10*3/uL (ref 150–400)
RBC: 2.98 MIL/uL — ABNORMAL LOW (ref 3.87–5.11)
RDW: 13.4 % (ref 11.5–15.5)
WBC: 7.9 10*3/uL (ref 4.0–10.5)
nRBC: 0 % (ref 0.0–0.2)

## 2021-11-11 LAB — HEPARIN LEVEL (UNFRACTIONATED): Heparin Unfractionated: 0.76 IU/mL — ABNORMAL HIGH (ref 0.30–0.70)

## 2021-11-11 LAB — POCT ACTIVATED CLOTTING TIME
Activated Clotting Time: 269 seconds
Activated Clotting Time: 287 seconds
Activated Clotting Time: 395 seconds

## 2021-11-11 SURGERY — LEFT HEART CATH AND CORONARY ANGIOGRAPHY
Anesthesia: LOCAL

## 2021-11-11 MED ORDER — HYDRALAZINE HCL 20 MG/ML IJ SOLN: 10.0000 mg | INTRAMUSCULAR | Status: AC | PRN

## 2021-11-11 MED ORDER — MIDAZOLAM HCL 2 MG/2ML IJ SOLN
INTRAMUSCULAR | Status: DC | PRN
  Administered 2021-11-11 (×2): 1 mg via INTRAVENOUS

## 2021-11-11 MED ORDER — HEPARIN (PORCINE) IN NACL 1000-0.9 UT/500ML-% IV SOLN
INTRAVENOUS | Status: AC
  Filled 2021-11-11: qty 1000

## 2021-11-11 MED ORDER — SODIUM CHLORIDE 0.9% FLUSH
3.0000 mL | Freq: Two times a day (BID) | INTRAVENOUS | Status: DC
  Administered 2021-11-12 – 2021-11-14 (×4): 3 mL via INTRAVENOUS

## 2021-11-11 MED ORDER — SODIUM CHLORIDE 0.9 % WEIGHT BASED INFUSION
1.0000 mL/kg/h | INTRAVENOUS | Status: AC
  Administered 2021-11-11: 1 mL/kg/h via INTRAVENOUS

## 2021-11-11 MED ORDER — LIDOCAINE HCL (PF) 1 % IJ SOLN
INTRAMUSCULAR | Status: AC
  Filled 2021-11-11: qty 30

## 2021-11-11 MED ORDER — LABETALOL HCL 5 MG/ML IV SOLN: 10.0000 mg | INTRAVENOUS | Status: AC | PRN

## 2021-11-11 MED ORDER — SODIUM CHLORIDE 0.9 % IV SOLN
250.0000 mL | INTRAVENOUS | Status: DC | PRN
  Administered 2021-11-11: 500 mL via INTRAVENOUS

## 2021-11-11 MED ORDER — SODIUM CHLORIDE 0.9 % WEIGHT BASED INFUSION: 1.0000 mL/kg/h | INTRAVENOUS | Status: DC

## 2021-11-11 MED ORDER — FENTANYL CITRATE (PF) 100 MCG/2ML IJ SOLN
INTRAMUSCULAR | Status: AC
  Filled 2021-11-11: qty 2

## 2021-11-11 MED ORDER — HEPARIN SODIUM (PORCINE) 1000 UNIT/ML IJ SOLN
INTRAMUSCULAR | Status: AC
  Filled 2021-11-11: qty 10

## 2021-11-11 MED ORDER — NITROGLYCERIN 1 MG/10 ML FOR IR/CATH LAB
INTRA_ARTERIAL | Status: DC | PRN
  Administered 2021-11-11 (×2): 200 ug via INTRACORONARY

## 2021-11-11 MED ORDER — SODIUM CHLORIDE 0.9 % WEIGHT BASED INFUSION
3.0000 mL/kg/h | INTRAVENOUS | Status: DC
  Administered 2021-11-11: 3 mL/kg/h via INTRAVENOUS

## 2021-11-11 MED ORDER — FENTANYL CITRATE (PF) 100 MCG/2ML IJ SOLN
INTRAMUSCULAR | Status: DC | PRN
Start: 2021-11-11 — End: 2021-11-11
  Administered 2021-11-11 (×4): 25 ug via INTRAVENOUS

## 2021-11-11 MED ORDER — SODIUM CHLORIDE 0.9 % IV SOLN: 250.0000 mL | INTRAVENOUS | Status: DC | PRN

## 2021-11-11 MED ORDER — HEPARIN BOLUS VIA INFUSION
4000.0000 [IU] | Freq: Once | INTRAVENOUS | Status: AC
  Administered 2021-11-11: 4000 [IU] via INTRAVENOUS
  Filled 2021-11-11: qty 4000

## 2021-11-11 MED ORDER — ASPIRIN 81 MG PO CHEW
81.0000 mg | CHEWABLE_TABLET | ORAL | Status: AC
  Administered 2021-11-11: 81 mg via ORAL

## 2021-11-11 MED ORDER — HEPARIN (PORCINE) 25000 UT/250ML-% IV SOLN
1100.0000 [IU]/h | INTRAVENOUS | Status: DC
  Administered 2021-11-11: 1100 [IU]/h via INTRAVENOUS

## 2021-11-11 MED ORDER — NITROGLYCERIN 1 MG/10 ML FOR IR/CATH LAB
INTRA_ARTERIAL | Status: AC
  Filled 2021-11-11: qty 10

## 2021-11-11 MED ORDER — PRASUGREL HCL 10 MG PO TABS
ORAL_TABLET | ORAL | Status: AC
  Filled 2021-11-11: qty 1

## 2021-11-11 MED ORDER — ACETAMINOPHEN 325 MG PO TABS
ORAL_TABLET | ORAL | Status: AC
  Filled 2021-11-11: qty 2

## 2021-11-11 MED ORDER — LIDOCAINE HCL (PF) 1 % IJ SOLN
INTRAMUSCULAR | Status: DC | PRN
Start: 2021-11-11 — End: 2021-11-11
  Administered 2021-11-11: 10 mL

## 2021-11-11 MED ORDER — SODIUM CHLORIDE 0.9% FLUSH: 3.0000 mL | INTRAVENOUS | Status: DC | PRN

## 2021-11-11 MED ORDER — SODIUM CHLORIDE 0.9% FLUSH: 3.0000 mL | Freq: Two times a day (BID) | INTRAVENOUS | Status: DC

## 2021-11-11 MED ORDER — HEPARIN (PORCINE) IN NACL 1000-0.9 UT/500ML-% IV SOLN
INTRAVENOUS | Status: DC | PRN
  Administered 2021-11-11 (×2): 500 mL

## 2021-11-11 MED ORDER — VERAPAMIL HCL 2.5 MG/ML IV SOLN
INTRAVENOUS | Status: AC
  Filled 2021-11-11: qty 2

## 2021-11-11 MED ORDER — ASPIRIN 81 MG PO CHEW
CHEWABLE_TABLET | ORAL | Status: AC
  Filled 2021-11-11: qty 1

## 2021-11-11 MED ORDER — IOHEXOL 350 MG/ML SOLN
INTRAVENOUS | Status: DC | PRN
  Administered 2021-11-11: 190 mL

## 2021-11-11 MED ORDER — MIDAZOLAM HCL 2 MG/2ML IJ SOLN
INTRAMUSCULAR | Status: AC
  Filled 2021-11-11: qty 2

## 2021-11-11 MED ORDER — HEPARIN SODIUM (PORCINE) 1000 UNIT/ML IJ SOLN
INTRAMUSCULAR | Status: DC | PRN
  Administered 2021-11-11: 7000 [IU] via INTRAVENOUS
  Administered 2021-11-11 (×2): 3000 [IU] via INTRAVENOUS
  Administered 2021-11-11: 2000 [IU] via INTRAVENOUS

## 2021-11-11 SURGICAL SUPPLY — 31 items
BALL SAPPHIRE NC24 3.0X15 (BALLOONS) ×2
BALLN SAPPHIRE 2.5X12 (BALLOONS) ×2
BALLN SCOREFLEX 2.50X10 (BALLOONS) ×2
BALLN SCOREFLEX 2.50X15 (BALLOONS) ×2
BALLOON SAPPHIRE 2.5X12 (BALLOONS) IMPLANT
BALLOON SAPPHIRE NC24 3.0X15 (BALLOONS) IMPLANT
BALLOON SCOREFLEX 2.50X10 (BALLOONS) IMPLANT
BALLOON SCOREFLEX 2.50X15 (BALLOONS) IMPLANT
CATH GUIDEZILLA II 6F (CATHETERS) IMPLANT
CATH INFINITI 5FR MULTPACK ANG (CATHETERS) ×1 IMPLANT
CATH LAUNCHER 6FR EBU3.5 (CATHETERS) ×1 IMPLANT
CATH SHOCKWAVE 2.5X12 (CATHETERS) IMPLANT
CATHETER GUIDEZILLA II 6F (CATHETERS) ×2
CATHETER SHOCKWAVE 2.5X12 (CATHETERS) ×2
ELECT DEFIB PAD ADLT CADENCE (PAD) ×1 IMPLANT
GLIDESHEATH SLEND SS 6F .021 (SHEATH) IMPLANT
GUIDEWIRE INQWIRE 1.5J.035X260 (WIRE) IMPLANT
INQWIRE 1.5J .035X260CM (WIRE)
KIT ENCORE 26 ADVANTAGE (KITS) ×1 IMPLANT
KIT HEART LEFT (KITS) ×2 IMPLANT
KIT MICROPUNCTURE NIT STIFF (SHEATH) ×1 IMPLANT
PACK CARDIAC CATHETERIZATION (CUSTOM PROCEDURE TRAY) ×2 IMPLANT
SHEATH PINNACLE 5F 10CM (SHEATH) ×1 IMPLANT
SHEATH PINNACLE 6F 10CM (SHEATH) ×1 IMPLANT
SHEATH PROBE COVER 6X72 (BAG) ×2 IMPLANT
STENT ONYX FRONTIER 2.5X30 (Permanent Stent) ×1 IMPLANT
SYR MEDRAD MARK 7 150ML (SYRINGE) ×2 IMPLANT
TRANSDUCER W/STOPCOCK (MISCELLANEOUS) ×2 IMPLANT
TUBING CIL FLEX 10 FLL-RA (TUBING) ×2 IMPLANT
WIRE EMERALD 3MM-J .035X150CM (WIRE) ×1 IMPLANT
WIRE RUNTHROUGH .014X180CM (WIRE) ×1 IMPLANT

## 2021-11-11 NOTE — Progress Notes (Signed)
59f sheath removed from right groin at 1541 hrs using manual pressure.  Hemostasis achieved at 1606 hrs, site was dressed with tegaderm and gauze.  Distal pulses intact. ? ?CKinder Morgan EnergyRt-R ?

## 2021-11-11 NOTE — Progress Notes (Signed)
Notified Rachel Clark in the cath lab that no aspirin was given this morning. Only obtained consent from husband and started IV fluids.  ?Spoke to [place] in the cath lab at 920 and let him know that effient was also not given today.  ?

## 2021-11-11 NOTE — Progress Notes (Signed)
ANTICOAGULATION CONSULT NOTE - Initial Consult ? ?Pharmacy Consult for heparin ?Indication: chest pain/ACS ? ?Allergies  ?Allergen Reactions  ? Sulfamethoxazole Anaphylaxis  ?  Don't recall  ? Aspirin Other (See Comments)  ?  GI upset- can tolerate 81 mg ASA, just not full doses  ? Crestor [Rosuvastatin]   ?  myalgias  ? Erythromycin Nausea Only  ?  Gi upset  ? Lisinopril Cough  ? Niacin And Related Other (See Comments)  ?  Don't recall  ? Penicillin G Benzathine   ?  Other reaction(s): Unknown  ? Statins Other (See Comments)  ?  Muscle pain  ? Sulfa Drugs Cross Reactors Swelling  ?  Don't recall  ? Tetracyclines & Related Nausea Only  ? Xeloda [Capecitabine] Diarrhea  ? Penicillins Nausea Only and Rash  ?   ?  ? Plavix [Clopidogrel Bisulfate] Rash  ? ? ?Patient Measurements: ?Height: '5\' 7"'$  (170.2 cm) ?Weight: 88.1 kg (194 lb 3.6 oz) ?IBW/kg (Calculated) : 61.6 ?Heparin dosing weight: 80kg ? ?Vital Signs: ?Temp: 98.3 ?F (36.8 ?C) (05/11 1842) ?Temp Source: Oral (05/11 1842) ?BP: 157/81 (05/11 2330) ?Pulse Rate: 85 (05/11 2330) ? ?Labs: ?Recent Labs  ?  11/09/21 ?1454 11/09/21 ?1654 11/10/21 ?0321 11/10/21 ?0500 11/10/21 ?1843 11/10/21 ?2050  ?HGB 11.5*  --  9.4*  --  9.4*  --   ?HCT 35.1*  --  28.1*  --  29.3*  --   ?PLT 333  --  269  --  328  --   ?CREATININE 0.73  --  0.80  --  0.83  --   ?TROPONINIHS 9   < > 38* 31* 27* 259*  ? < > = values in this interval not displayed.  ? ? ?Estimated Creatinine Clearance: 69.8 mL/min (by C-G formula based on SCr of 0.83 mg/dL). ? ? ?Medical History: ?Past Medical History:  ?Diagnosis Date  ? Arthritis   ? "fingers" (06/29/2016)  ? Chronic lower back pain   ? Colon cancer ([place]) 12/04/2011  ? s/p Laparoscopic-assisted transverse colectomy on 12/19/2011 by Dr. Donne Hazel.  pT3 N0 M0.   ? Complete heart block ([place])   ? Coronary artery disease   ? Depression   ? Dyslipidemia   ? Fibromyalgia   ? Gastric ulcer   ? GERD (gastroesophageal reflux disease)   ? GI bleed   ? 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 last seizure was in 1972 ;takes Tegretol (06/29/2016)   ? Headache   ? "weekly" (06/29/2016)  ? Hyperlipidemia   ? Hyponatremia   ? Hypothyroid   ? Iron deficiency anemia 11/17/2011  ? Memory change 11/13/2016  ? Monoclonal gammopathy of unknown significance   ? Orthostatic hypotension   ? Presence of permanent cardiac pacemaker   ? Syncope and collapse   ? pacemaker implanted  ? ? ?Assessment: ?71yo female admitted 5/10 for CP and discharged next day, re-presented to ED c/o worsening CP despite Imdur, troponins elevated and rising >> to begin heparin. ? ?Goal of Therapy:  ?Heparin level 0.3-0.7 units/ml ?Monitor platelets by anticoagulation protocol: Yes ?  ?Plan:  ?Heparin 4000 units IV bolus x1 followed by infusion at 1100 units/hr and monitor heparin levels and CBC. ? ?Wynona Neat, PharmD, BCPS  ?11/11/2021,12:02 AM ? ? ?

## 2021-11-11 NOTE — Progress Notes (Signed)
? ?Progress Note ? ?Patient Name: Rachel Clark ?Date of Encounter: 11/11/2021 ? ?[facility] Cardiologist: Peter [name], MD  ? ?Subjective  ? ?Chest pain last night. No chest pain this am.  ? ?Inpatient Medications  ?  ?Scheduled Meds: ? aspirin  81 mg Oral Daily  ? Bempedoic Acid-Ezetimibe  180 mg Oral Daily  ? calcium carbonate  500 mg Oral Daily  ? carbamazepine  600 mg Oral BID  ? cholecalciferol  2,000 Units Oral Daily  ? diphenhydrAMINE  25 mg Oral QHS  ? DULoxetine  60 mg Oral BID  ? ethosuximide  500 mg Oral BID  ? isosorbide mononitrate  15 mg Oral Daily  ? levothyroxine  50 mcg Oral Q0600  ? memantine  10 mg Oral BID  ? pantoprazole  40 mg Oral Daily  ? prasugrel  10 mg Oral Daily  ? ?Continuous Infusions: ? heparin 1,100 Units/hr (11/11/21 0044)  ? ?PRN Meds: ?acetaminophen, azelastine, fluticasone, nitroGLYCERIN, ondansetron (ZOFRAN) IV, zolpidem  ? ?Vital Signs  ?  ?Vitals:  ? 11/11/21 0000 11/11/21 0047 11/11/21 0311 11/11/21 0737  ?BP:  (!) 153/58 (!) 153/75   ?Pulse:  92 (!) 101 88  ?Resp:  '16 18 16  '$ ?Temp:  98.4 ?F (36.9 ?C) 97.7 ?F (36.5 ?C) 97.8 ?F (36.6 ?C)  ?TempSrc:  Oral Oral Oral  ?SpO2:  100% 100%   ?Weight: 88.1 kg 80 kg    ?Height: '5\' 7"'$  (1.702 m) '5\' 7"'$  (1.702 m)    ? ? ?Intake/Output Summary (Last 24 hours) at 11/11/2021 0747 ?Last data filed at 11/11/2021 0700 ?Gross per 24 hour  ?Intake 108.49 ml  ?Output --  ?Net 108.49 ml  ? ? ?  11/11/2021  ? 12:47 AM 11/11/2021  ? 12:00 AM 11/09/2021  ?  6:21 PM  ?Last 3 Weights  ?Weight (lbs) 176 lb 4.8 oz 194 lb 3.6 oz 194 lb 3.6 oz  ?Weight (kg) 79.969 kg 88.1 kg 88.1 kg  ?   ? ?Telemetry  ?  ?Sinus, PVCs - Personally Reviewed ? ?ECG  ?  ?Sinus, non-specific T wave abnormality, PVCs - Personally Reviewed ? ?Physical Exam  ? ?GEN: No acute distress.   ?Neck: No JVD ?Cardiac: RRR, no murmurs, rubs, or gallops.  ?Respiratory: Clear to auscultation bilaterally. ?GI: Soft, nontender, non-distended  ?MS: No edema; No deformity. ?Neuro:  Nonfocal   ?Psych: Normal affect  ? ?Labs  ?  ?High Sensitivity Troponin:   ?Recent Labs  ?Lab 11/09/21 ?1947 11/10/21 ?0321 11/10/21 ?0500 11/10/21 ?1843 11/10/21 ?2050  ?TROPONINIHS 41* 38* 31* 27* 259*  ?   ?Chemistry ?Recent Labs  ?Lab 11/09/21 ?1454 11/10/21 ?0321 11/10/21 ?1843  ?NA 131* 132* 132*  ?K 4.6 4.2 4.2  ?CL 101 103 102  ?CO2 24 21* 24  ?GLUCOSE 111* 114* 132*  ?BUN 19 33* 26*  ?CREATININE 0.73 0.80 0.83  ?CALCIUM 8.5* 8.1* 8.6*  ?GFRNONAA >60 >60 >60  ?ANIONGAP '6 8 6  '$ ?  ?Lipids No results for input(s): CHOL, TRIG, HDL, LABVLDL, LDLCALC, CHOLHDL in the last 168 hours.  ?Hematology ?Recent Labs  ?Lab 11/09/21 ?1454 11/10/21 ?0321 11/10/21 ?1843  ?WBC 7.6 7.0 7.9  ?RBC 4.07 3.24* 3.38*  ?HGB 11.5* 9.4* 9.4*  ?HCT 35.1* 28.1* 29.3*  ?MCV 86.2 86.7 86.7  ?MCH 28.3 29.0 27.8  ?MCHC 32.8 33.5 32.1  ?RDW 13.2 13.2 13.2  ?PLT 333 269 328  ? ?Thyroid  ?Recent Labs  ?Lab 11/10/21 ?0321  ?TSH 1.782  ?  ?  BNPNo results for input(s): BNP, PROBNP in the last 168 hours.  ?DDimer No results for input(s): DDIMER in the last 168 hours.  ? ?Radiology  ?  ?DG Chest 2 View ? ?Result Date: 11/10/2021 ?CLINICAL DATA:  Chest pain. Recent hospitalization, discharged today for same. EXAM: CHEST - 2 VIEW COMPARISON:  Radiograph yesterday. FINDINGS: Post median sternotomy. Left-sided pacemaker in place. Normal heart size with stable mediastinal contours. Aortic atherosclerosis. Coronary artery calcifications/stent visualized. There is no pulmonary edema, pleural effusion, focal airspace disease or pneumothorax. No acute osseous findings. IMPRESSION: 1. No acute chest findings. 2. Post median sternotomy with left-sided pacemaker in place. Electronically Signed   By: Keith Rake M.D.   On: 11/10/2021 19:55  ? ?DG Chest 2 View ? ?Result Date: 11/09/2021 ?CLINICAL DATA:  Chest pain. EXAM: CHEST - 2 VIEW COMPARISON:  March 13, 2021. FINDINGS: The heart size and mediastinal contours are within normal limits. Status post coronary bypass  graft. Left-sided pacemaker is unchanged in position. Both lungs are clear. The visualized skeletal structures are unremarkable. IMPRESSION: No active cardiopulmonary disease. Electronically Signed   By: Marijo Conception M.D.   On: 11/09/2021 15:23   ? ?Cardiac Studies  ? ?Left Cardiac Catheterization 0/12/2020: ?  Prox LAD lesion is 100% stenosed. ?  Origin lesion is 100% stenosed. ?  Ost LAD to Prox LAD lesion is 60% stenosed. ?  Ost 1st Diag to 1st Diag lesion is 85% stenosed. ?  Ramus lesion is 99% stenosed. ?  Ost Ramus to Ramus lesion is 90% stenosed. ?  Non-stenotic Ost RCA to Mid RCA lesion was previously treated. ?  A drug-eluting stent was successfully placed using a STENT ONYX FRONTIER 3.0X15. ?  A drug-eluting stent was successfully placed using a SYNERGY XD 3.0X16. ?  A drug-eluting stent was successfully placed using a SYNERGY XD 3.0X20. ?  Post intervention, there is a 0% residual stenosis. ?  Post intervention, there is a 0% residual stenosis. ?  The left ventricular systolic function is normal. ?  LV end diastolic pressure is normal. ?  The left ventricular ejection fraction is 55-65% by visual estimate. ?  The radiation dose exceeded thresholds defined in the "Patient Radiation Dose Management For Interventional Medical Procedures With Extensive Use of Fluoroscopy" policy. Specific follow up instructions will be provided to the patient prior to discharge. ?  ?Severe 3 vessel obstructive CAD ?Patent LIMA to the LAD ?Known occlusion of SVG to RCA ?Continued patency of stents in the RCA ?De novo high grade disease in the proximal and mid ramus intermediate. This is the culprit ?Normal LV function ?Normal LVEDP ?Successful PCI of the Ramus intermediate with DES x 3 in overlapping fashion. ?  ?Plan: DAPT for one year. Anticipate possible DC tomorrow.  ? ? ?Patient Profile  ?   ?71 y.o. female with a history of CAD with remote NSTEMI s/p PCI to RCA and then CABG in 08/2005 with subsequent failure of SVG to  RCA and then extensive PCI of RCA including full-metal jacket DES stenting for severe diffuse RCA disease in 11/2015 and then PTCA of ostial RCA restenosis in 06/2016 (most recent PCI was DES to RI in 03/2021), complete heart block s/p PPM in 03/2015, recurrent syncope/dizziness felt to to be secondary to orthostasis, hyperlipidemia intolerant to statins, hypothyroidism, GERD, fibromyalgia,  colon cancer s/p resection and chemotherapy, prior GI bleed, seizure disorder, memory loss, and prior tobacco use who was recently admitted earlier this week for recurrent chest pain.  Case was reviewed with primary Cardiologist (Dr. [name]) who felt it was likely due to known diffuse CAD. Invasive evaluation was not recommended so she was started on low dose Imdur with improvement in symptoms. She was discharged on 11/10/2021 but returned to the ED later that night for recurrent chest pain and was readmitted. ? ?Assessment & Plan  ?  ?CAD/NSTEMI: Patient has extensive CAD as described above. She was just discharged yesterday after being admitted for chest pain. After discussing primary Cardiologist, plan was to medically treat and she was started on Imdur with improvement. However, she presented back to the ED last night with recurrent chest pain. High-sensitivity troponin initially 27 but then jumped to 259 (peaked at 41 during most recent admission).  ?- Will plan cardiac cath today.  I have reviewed the risks, indications, and alternatives to cardiac catheterization, possible angioplasty, and stenting with the patient. Risks include but are not limited to bleeding, infection, vascular injury, stroke, myocardial infection, arrhythmia, kidney injury, radiation-related injury in the case of prolonged fluoroscopy use, emergency cardiac surgery, and death. The patient understands the risks of serious complication is 1-2 in 6073 with diagnostic cardiac cath and 1-2% or less with angioplasty/stenting.  ?- Echo pending. ?- Continue DAPT  with Aspirin and Effient. ?- Antianginal therapy limited due to history of orthostatic hypotension and chronic dizziness. Continue low dose Imdur '15mg'$  daily. ?- Intolerant to statins. Continue Bempedoic

## 2021-11-11 NOTE — Interval H&P Note (Signed)
History and Physical Interval Note: ? ?11/11/2021 ?9:11 AM ? ?Rachel Clark  has presented today for surgery, with the diagnosis of nstemi.  The various methods of treatment have been discussed with the patient and family. After consideration of risks, benefits and other options for treatment, the patient has consented to  Procedure(s): ?LEFT HEART CATH AND CORONARY ANGIOGRAPHY (N/A)  ?PERCUTANEOUS CORONARY INTERVENTION ? ? ?as a surgical intervention.  The patient's history has been reviewed, patient examined, no change in status, stable for surgery.  I have reviewed the patient's chart and labs.  Questions were answered to the patient's satisfaction.   ? ?Cath Lab Visit (complete for each Cath Lab visit) ? ?Clinical Evaluation Leading to the Procedure:  ? ?ACS: Yes.   ? ?Non-ACS:   ? ?Anginal Classification: CCS IV ? ?Anti-ischemic medical therapy: Maximal Therapy (2 or more classes of medications) ? ?Non-Invasive Test Results: No non-invasive testing performed ? ?Prior CABG: Previous CABG ? ? ?Glenetta Hew ? ? ?

## 2021-11-11 NOTE — H&P (View-Only) (Signed)
? ?Progress Note ? ?Patient Name: Rachel Clark ?Date of Encounter: 11/11/2021 ? ?[facility] Cardiologist: Peter [name], MD  ? ?Subjective  ? ?Chest pain last night. No chest pain this am.  ? ?Inpatient Medications  ?  ?Scheduled Meds: ? aspirin  81 mg Oral Daily  ? Bempedoic Acid-Ezetimibe  180 mg Oral Daily  ? calcium carbonate  500 mg Oral Daily  ? carbamazepine  600 mg Oral BID  ? cholecalciferol  2,000 Units Oral Daily  ? diphenhydrAMINE  25 mg Oral QHS  ? DULoxetine  60 mg Oral BID  ? ethosuximide  500 mg Oral BID  ? isosorbide mononitrate  15 mg Oral Daily  ? levothyroxine  50 mcg Oral Q0600  ? memantine  10 mg Oral BID  ? pantoprazole  40 mg Oral Daily  ? prasugrel  10 mg Oral Daily  ? ?Continuous Infusions: ? heparin 1,100 Units/hr (11/11/21 0044)  ? ?PRN Meds: ?acetaminophen, azelastine, fluticasone, nitroGLYCERIN, ondansetron (ZOFRAN) IV, zolpidem  ? ?Vital Signs  ?  ?Vitals:  ? 11/11/21 0000 11/11/21 0047 11/11/21 0311 11/11/21 0737  ?BP:  (!) 153/58 (!) 153/75   ?Pulse:  92 (!) 101 88  ?Resp:  '16 18 16  '$ ?Temp:  98.4 ?F (36.9 ?C) 97.7 ?F (36.5 ?C) 97.8 ?F (36.6 ?C)  ?TempSrc:  Oral Oral Oral  ?SpO2:  100% 100%   ?Weight: 88.1 kg 80 kg    ?Height: '5\' 7"'$  (1.702 m) '5\' 7"'$  (1.702 m)    ? ? ?Intake/Output Summary (Last 24 hours) at 11/11/2021 0747 ?Last data filed at 11/11/2021 0700 ?Gross per 24 hour  ?Intake 108.49 ml  ?Output --  ?Net 108.49 ml  ? ? ?  11/11/2021  ? 12:47 AM 11/11/2021  ? 12:00 AM 11/09/2021  ?  6:21 PM  ?Last 3 Weights  ?Weight (lbs) 176 lb 4.8 oz 194 lb 3.6 oz 194 lb 3.6 oz  ?Weight (kg) 79.969 kg 88.1 kg 88.1 kg  ?   ? ?Telemetry  ?  ?Sinus, PVCs - Personally Reviewed ? ?ECG  ?  ?Sinus, non-specific T wave abnormality, PVCs - Personally Reviewed ? ?Physical Exam  ? ?GEN: No acute distress.   ?Neck: No JVD ?Cardiac: RRR, no murmurs, rubs, or gallops.  ?Respiratory: Clear to auscultation bilaterally. ?GI: Soft, nontender, non-distended  ?MS: No edema; No deformity. ?Neuro:  Nonfocal   ?Psych: Normal affect  ? ?Labs  ?  ?High Sensitivity Troponin:   ?Recent Labs  ?Lab 11/09/21 ?1947 11/10/21 ?0321 11/10/21 ?0500 11/10/21 ?1843 11/10/21 ?2050  ?TROPONINIHS 41* 38* 31* 27* 259*  ?   ?Chemistry ?Recent Labs  ?Lab 11/09/21 ?1454 11/10/21 ?0321 11/10/21 ?1843  ?NA 131* 132* 132*  ?K 4.6 4.2 4.2  ?CL 101 103 102  ?CO2 24 21* 24  ?GLUCOSE 111* 114* 132*  ?BUN 19 33* 26*  ?CREATININE 0.73 0.80 0.83  ?CALCIUM 8.5* 8.1* 8.6*  ?GFRNONAA >60 >60 >60  ?ANIONGAP '6 8 6  '$ ?  ?Lipids No results for input(s): CHOL, TRIG, HDL, LABVLDL, LDLCALC, CHOLHDL in the last 168 hours.  ?Hematology ?Recent Labs  ?Lab 11/09/21 ?1454 11/10/21 ?0321 11/10/21 ?1843  ?WBC 7.6 7.0 7.9  ?RBC 4.07 3.24* 3.38*  ?HGB 11.5* 9.4* 9.4*  ?HCT 35.1* 28.1* 29.3*  ?MCV 86.2 86.7 86.7  ?MCH 28.3 29.0 27.8  ?MCHC 32.8 33.5 32.1  ?RDW 13.2 13.2 13.2  ?PLT 333 269 328  ? ?Thyroid  ?Recent Labs  ?Lab 11/10/21 ?0321  ?TSH 1.782  ?  ?  BNPNo results for input(s): BNP, PROBNP in the last 168 hours.  ?DDimer No results for input(s): DDIMER in the last 168 hours.  ? ?Radiology  ?  ?DG Chest 2 View ? ?Result Date: 11/10/2021 ?CLINICAL DATA:  Chest pain. Recent hospitalization, discharged today for same. EXAM: CHEST - 2 VIEW COMPARISON:  Radiograph yesterday. FINDINGS: Post median sternotomy. Left-sided pacemaker in place. Normal heart size with stable mediastinal contours. Aortic atherosclerosis. Coronary artery calcifications/stent visualized. There is no pulmonary edema, pleural effusion, focal airspace disease or pneumothorax. No acute osseous findings. IMPRESSION: 1. No acute chest findings. 2. Post median sternotomy with left-sided pacemaker in place. Electronically Signed   By: Keith Rake M.D.   On: 11/10/2021 19:55  ? ?DG Chest 2 View ? ?Result Date: 11/09/2021 ?CLINICAL DATA:  Chest pain. EXAM: CHEST - 2 VIEW COMPARISON:  March 13, 2021. FINDINGS: The heart size and mediastinal contours are within normal limits. Status post coronary bypass  graft. Left-sided pacemaker is unchanged in position. Both lungs are clear. The visualized skeletal structures are unremarkable. IMPRESSION: No active cardiopulmonary disease. Electronically Signed   By: Marijo Conception M.D.   On: 11/09/2021 15:23   ? ?Cardiac Studies  ? ?Left Cardiac Catheterization 0/12/2020: ?  Prox LAD lesion is 100% stenosed. ?  Origin lesion is 100% stenosed. ?  Ost LAD to Prox LAD lesion is 60% stenosed. ?  Ost 1st Diag to 1st Diag lesion is 85% stenosed. ?  Ramus lesion is 99% stenosed. ?  Ost Ramus to Ramus lesion is 90% stenosed. ?  Non-stenotic Ost RCA to Mid RCA lesion was previously treated. ?  A drug-eluting stent was successfully placed using a STENT ONYX FRONTIER 3.0X15. ?  A drug-eluting stent was successfully placed using a SYNERGY XD 3.0X16. ?  A drug-eluting stent was successfully placed using a SYNERGY XD 3.0X20. ?  Post intervention, there is a 0% residual stenosis. ?  Post intervention, there is a 0% residual stenosis. ?  The left ventricular systolic function is normal. ?  LV end diastolic pressure is normal. ?  The left ventricular ejection fraction is 55-65% by visual estimate. ?  The radiation dose exceeded thresholds defined in the "Patient Radiation Dose Management For Interventional Medical Procedures With Extensive Use of Fluoroscopy" policy. Specific follow up instructions will be provided to the patient prior to discharge. ?  ?Severe 3 vessel obstructive CAD ?Patent LIMA to the LAD ?Known occlusion of SVG to RCA ?Continued patency of stents in the RCA ?De novo high grade disease in the proximal and mid ramus intermediate. This is the culprit ?Normal LV function ?Normal LVEDP ?Successful PCI of the Ramus intermediate with DES x 3 in overlapping fashion. ?  ?Plan: DAPT for one year. Anticipate possible DC tomorrow.  ? ? ?Patient Profile  ?   ?71 y.o. female with a history of CAD with remote NSTEMI s/p PCI to RCA and then CABG in 08/2005 with subsequent failure of SVG to  RCA and then extensive PCI of RCA including full-metal jacket DES stenting for severe diffuse RCA disease in 11/2015 and then PTCA of ostial RCA restenosis in 06/2016 (most recent PCI was DES to RI in 03/2021), complete heart block s/p PPM in 03/2015, recurrent syncope/dizziness felt to to be secondary to orthostasis, hyperlipidemia intolerant to statins, hypothyroidism, GERD, fibromyalgia,  colon cancer s/p resection and chemotherapy, prior GI bleed, seizure disorder, memory loss, and prior tobacco use who was recently admitted earlier this week for recurrent chest pain.  Case was reviewed with primary Cardiologist (Dr. [name]) who felt it was likely due to known diffuse CAD. Invasive evaluation was not recommended so she was started on low dose Imdur with improvement in symptoms. She was discharged on 11/10/2021 but returned to the ED later that night for recurrent chest pain and was readmitted. ? ?Assessment & Plan  ?  ?CAD/NSTEMI: Patient has extensive CAD as described above. She was just discharged yesterday after being admitted for chest pain. After discussing primary Cardiologist, plan was to medically treat and she was started on Imdur with improvement. However, she presented back to the ED last night with recurrent chest pain. High-sensitivity troponin initially 27 but then jumped to 259 (peaked at 41 during most recent admission).  ?- Will plan cardiac cath today.  I have reviewed the risks, indications, and alternatives to cardiac catheterization, possible angioplasty, and stenting with the patient. Risks include but are not limited to bleeding, infection, vascular injury, stroke, myocardial infection, arrhythmia, kidney injury, radiation-related injury in the case of prolonged fluoroscopy use, emergency cardiac surgery, and death. The patient understands the risks of serious complication is 1-2 in 1610 with diagnostic cardiac cath and 1-2% or less with angioplasty/stenting.  ?- Echo pending. ?- Continue DAPT  with Aspirin and Effient. ?- Antianginal therapy limited due to history of orthostatic hypotension and chronic dizziness. Continue low dose Imdur '15mg'$  daily. ?- Intolerant to statins. Continue Bempedoic

## 2021-11-12 ENCOUNTER — Inpatient Hospital Stay (HOSPITAL_COMMUNITY): Payer: Medicare Other

## 2021-11-12 DIAGNOSIS — I214 Non-ST elevation (NSTEMI) myocardial infarction: Secondary | ICD-10-CM

## 2021-11-12 DIAGNOSIS — D500 Iron deficiency anemia secondary to blood loss (chronic): Secondary | ICD-10-CM | POA: Diagnosis not present

## 2021-11-12 LAB — CBC
HCT: 24.9 % — ABNORMAL LOW (ref 36.0–46.0)
Hemoglobin: 8.3 g/dL — ABNORMAL LOW (ref 12.0–15.0)
MCH: 28.8 pg (ref 26.0–34.0)
MCHC: 33.3 g/dL (ref 30.0–36.0)
MCV: 86.5 fL (ref 80.0–100.0)
Platelets: 258 10*3/uL (ref 150–400)
RBC: 2.88 MIL/uL — ABNORMAL LOW (ref 3.87–5.11)
RDW: 13.7 % (ref 11.5–15.5)
WBC: 8.5 10*3/uL (ref 4.0–10.5)
nRBC: 0 % (ref 0.0–0.2)

## 2021-11-12 LAB — RETICULOCYTES
Immature Retic Fract: 22.4 % — ABNORMAL HIGH (ref 2.3–15.9)
RBC.: 2.96 MIL/uL — ABNORMAL LOW (ref 3.87–5.11)
Retic Count, Absolute: 110.4 10*3/uL (ref 19.0–186.0)
Retic Ct Pct: 3.7 % — ABNORMAL HIGH (ref 0.4–3.1)

## 2021-11-12 LAB — IRON AND TIBC
Iron: 23 ug/dL — ABNORMAL LOW (ref 28–170)
Saturation Ratios: 7 % — ABNORMAL LOW (ref 10.4–31.8)
TIBC: 323 ug/dL (ref 250–450)
UIBC: 300 ug/dL

## 2021-11-12 LAB — ECHOCARDIOGRAM COMPLETE
AR max vel: 2.6 cm2
AV Area VTI: 2.72 cm2
AV Area mean vel: 2.4 cm2
AV Mean grad: 4 mmHg
AV Peak grad: 6.8 mmHg
Ao pk vel: 1.3 m/s
Area-P 1/2: 3.27 cm2
Height: 67 in
S' Lateral: 3.2 cm
Weight: 2820.8 oz

## 2021-11-12 LAB — FOLATE: Folate: 10 ng/mL (ref >5.9)

## 2021-11-12 LAB — BASIC METABOLIC PANEL
Anion gap: 5 (ref 5–15)
BUN: 12 mg/dL (ref 8–23)
CO2: 25 mmol/L (ref 22–32)
Calcium: 8.4 mg/dL — ABNORMAL LOW (ref 8.9–10.3)
Chloride: 102 mmol/L (ref 98–111)
Creatinine, Ser: 0.74 mg/dL (ref 0.44–1.00)
GFR, Estimated: >60 mL/min (ref >60)
Glucose, Bld: 106 mg/dL — ABNORMAL HIGH (ref 70–99)
Potassium: 4.1 mmol/L (ref 3.5–5.1)
Sodium: 132 mmol/L — ABNORMAL LOW (ref 135–145)

## 2021-11-12 LAB — FERRITIN: Ferritin: 14 ng/mL (ref 11–307)

## 2021-11-12 LAB — VITAMIN B12: Vitamin B-12: 202 pg/mL (ref 180–914)

## 2021-11-12 LAB — HEPARIN LEVEL (UNFRACTIONATED): Heparin Unfractionated: <0.1 IU/mL — ABNORMAL LOW (ref 0.30–0.70)

## 2021-11-12 LAB — LIPOPROTEIN A (LPA): Lipoprotein (a): 149.4 nmol/L — ABNORMAL HIGH (ref <75.0)

## 2021-11-12 MED ORDER — PANTOPRAZOLE SODIUM 40 MG IV SOLR
40.0000 mg | Freq: Two times a day (BID) | INTRAVENOUS | Status: DC
Start: 2021-11-12 — End: 2021-11-14
  Administered 2021-11-12 – 2021-11-13 (×3): 40 mg via INTRAVENOUS
  Filled 2021-11-12 (×3): qty 10

## 2021-11-12 MED ORDER — EZETIMIBE 10 MG PO TABS
10.0000 mg | ORAL_TABLET | Freq: Every day | ORAL | Status: DC
  Administered 2021-11-12 – 2021-11-14 (×3): 10 mg via ORAL
  Filled 2021-11-12 (×3): qty 1

## 2021-11-12 MED ORDER — PANTOPRAZOLE SODIUM 40 MG PO TBEC
40.0000 mg | DELAYED_RELEASE_TABLET | Freq: Two times a day (BID) | ORAL | Status: DC
Start: 2021-11-12 — End: 2021-11-12

## 2021-11-12 NOTE — Progress Notes (Addendum)
? ?Progress Note ? ?Patient Name: Rachel Clark ?Date of Encounter: 11/12/2021 ? ?[facility] Cardiologist: Peter [name], MD  ? ?Subjective  ? ?No complaints ? ?Inpatient Medications  ?  ?Scheduled Meds: ? aspirin  81 mg Oral Daily  ? Bempedoic Acid-Ezetimibe  180 mg Oral Daily  ? calcium carbonate  500 mg Oral Daily  ? carbamazepine  600 mg Oral BID  ? cholecalciferol  2,000 Units Oral Daily  ? diphenhydrAMINE  25 mg Oral QHS  ? DULoxetine  60 mg Oral BID  ? ethosuximide  500 mg Oral BID  ? isosorbide mononitrate  15 mg Oral Daily  ? levothyroxine  50 mcg Oral Q0600  ? memantine  10 mg Oral BID  ? pantoprazole  40 mg Oral Daily  ? prasugrel  10 mg Oral Daily  ? sodium chloride flush  3 mL Intravenous Q12H  ? ?Continuous Infusions: ? sodium chloride    ? ?PRN Meds: ?sodium chloride, acetaminophen, azelastine, fluticasone, nitroGLYCERIN, ondansetron (ZOFRAN) IV, sodium chloride flush, zolpidem  ? ?Vital Signs  ?  ?Vitals:  ? 11/11/21 1645 11/11/21 2010 11/12/21 0022 11/12/21 0445  ?BP: 121/69 127/69 (!) 119/58 114/61  ?Pulse: 89 86 84 86  ?Resp: '11 18 18 17  '$ ?Temp:  97.6 ?F (36.4 ?C) 97.8 ?F (36.6 ?C) 97.9 ?F (36.6 ?C)  ?TempSrc:  Oral Oral Oral  ?SpO2: 100% 100% 100% 96%  ?Weight:      ?Height:      ? ? ?Intake/Output Summary (Last 24 hours) at 11/12/2021 0852 ?Last data filed at 11/11/2021 2200 ?Gross per 24 hour  ?Intake 119.06 ml  ?Output 710 ml  ?Net -590.94 ml  ? ? ?  11/11/2021  ? 12:47 AM 11/11/2021  ? 12:00 AM 11/09/2021  ?  6:21 PM  ?Last 3 Weights  ?Weight (lbs) 176 lb 4.8 oz 194 lb 3.6 oz 194 lb 3.6 oz  ?Weight (kg) 79.969 kg 88.1 kg 88.1 kg  ?   ? ?Telemetry  ?  ?SR - Personally Reviewed ? ?ECG  ?  ?N/a - Personally Reviewed ? ?Physical Exam  ?N/a ?GEN: No acute distress.   ?Neck: No JVD ?Cardiac: RRR, no murmurs, rubs, or gallops.  ?Respiratory: Clear to auscultation bilaterally. ?GI: Soft, nontender, non-distended  ?MS: No edema; No deformity. ?Neuro:  Nonfocal  ?Psych: Normal affect  ? ?Labs  ?   ?High Sensitivity Troponin:   ?Recent Labs  ?Lab 11/09/21 ?1947 11/10/21 ?0321 11/10/21 ?0500 11/10/21 ?1843 11/10/21 ?2050  ?TROPONINIHS 41* 38* 31* 27* 259*  ?   ?Chemistry ?Recent Labs  ?Lab 11/10/21 ?1843 11/11/21 ?0938 11/12/21 ?0351  ?NA 132* 132* 132*  ?K 4.2 4.3 4.1  ?CL 102 101 102  ?CO2 '24 25 25  '$ ?GLUCOSE 132* 115* 106*  ?BUN 26* 17 12  ?CREATININE 0.83 0.74 0.74  ?CALCIUM 8.6* 8.3* 8.4*  ?GFRNONAA >60 >60 >60  ?ANIONGAP '6 6 5  '$ ?  ?Lipids No results for input(s): CHOL, TRIG, HDL, LABVLDL, LDLCALC, CHOLHDL in the last 168 hours.  ?Hematology ?Recent Labs  ?Lab 11/10/21 ?1843 11/11/21 ?1829 11/12/21 ?0351  ?WBC 7.9 7.9 8.5  ?RBC 3.38* 2.98* 2.88*  ?HGB 9.4* 8.6* 8.3*  ?HCT 29.3* 25.8* 24.9*  ?MCV 86.7 86.6 86.5  ?MCH 27.8 28.9 28.8  ?MCHC 32.1 33.3 33.3  ?RDW 13.2 13.4 13.7  ?PLT 328 261 258  ? ?Thyroid  ?Recent Labs  ?Lab 11/10/21 ?0321  ?TSH 1.782  ?  ?BNPNo results for input(s): BNP, PROBNP in the last  168 hours.  ?DDimer No results for input(s): DDIMER in the last 168 hours.  ? ?Radiology  ?  ?DG Chest 2 View ? ?Result Date: 11/10/2021 ?CLINICAL DATA:  Chest pain. Recent hospitalization, discharged today for same. EXAM: CHEST - 2 VIEW COMPARISON:  Radiograph yesterday. FINDINGS: Post median sternotomy. Left-sided pacemaker in place. Normal heart size with stable mediastinal contours. Aortic atherosclerosis. Coronary artery calcifications/stent visualized. There is no pulmonary edema, pleural effusion, focal airspace disease or pneumothorax. No acute osseous findings. IMPRESSION: 1. No acute chest findings. 2. Post median sternotomy with left-sided pacemaker in place. Electronically Signed   By: Keith Rake M.D.   On: 11/10/2021 19:55  ? ?CARDIAC CATHETERIZATION ? ?Result Date: 11/11/2021 ?  Ost LAD to Prox LAD lesion is 60% stenosed. Prox LAD lesion is 100% stenosed.   LIMA-graft was visualized by angiography and is normal in caliber.  The graft exhibits no disease.   SVG-dRCA: Origin lesion is 100%  stenosed.  graft was not visualized due to known occlusion.   Ost RCA to Mid RCA overlapping full metal jacket DES stents, widely patent.   Ost 1st Diag to 1st Diag lesion is 85% stenosed.   ----------------------   Lesion segment #2 (all in-stent restenosis) Ost Ramus to Ramus lesion is 75% stenosed.  Ramus-2 lesion is 85% stenosed.  Ramus-1 lesion is 35% stenosed.   Scoring balloon angioplasty was performed using a BALLN SCOREFLEX 2.50X15 -> followed by post dilation with a Se[place]our Balloon 3.0 mm x 15 mm   Post intervention, there is a 20% residual stenosis  in the proximal portion of stent segment.  Post intervention, there is a 0% residual stenosis in the distal portion of the stent.   CULPRIT LESION #1: Ramus-3 lesion is 95% stenosed.-2 tandem locations of calcified stenosis.   Scoring balloon angioplasty was performed using a BALLN SCOREFLEX 2.50X15.   A drug-eluting stent was successfully placed (overlapping the distal stent) using a STENT ONYX FRONTIER 2.5X30 -> tapered postdilation from overlap 3.1 mm down to 2.8 mm distally.   Post intervention, there is a 0% residual stenosis.   --------------------------------------------------------------------------- ---   LV end diastolic pressure is normal.   There is no aortic valve stenosis. SUMMARY Severe Native vessel CAD: stable severe proximal to mid LAD disease with LAD occlusion and patent LIMA-LAD, widely patent full metal jacket stented RCA, diffuse severe in-stent restenosis throughout the proximal and midportion of the stented segment of the native LCx with severe eccentric calcified lesions distal to the stent. Difficult/complex but successful scoring balloon angioplasty with shockwave lithotripsy followed by distal overlapping DES stent placement (Onyx Frontier DES 2.5 x 30 tapered postdilation from 3.1 in the overlap segment to 2.8 distally) Scoreflex angioplasty followed by post dilation with 3.0 mm Wales balloon throughout the entire stented segment of the  LCx reducing stenosis in the midportion to 10% and more proximally to 20%. Normal LVEDP RECOMMENDATION Monitor overnight, anticipate discharge in the morning. Aggressive risk factor modification Essentially lifelong finding.  In coverage with DAPT x1 year Glenetta Hew, MD  ? ?Cardiac Studies  ? ? ? ?Patient Profile  ?   ?71 y.o. female with a history of CAD with remote NSTEMI s/p PCI to RCA and then CABG in 08/2005 with subsequent failure of SVG to RCA and then extensive PCI of RCA including full-metal jacket DES stenting for severe diffuse RCA disease in 11/2015 and then PTCA of ostial RCA restenosis in 06/2016 (most recent PCI was DES to RI in 03/2021),  complete heart block s/p PPM in 03/2015, recurrent syncope/dizziness felt to to be secondary to orthostasis, hyperlipidemia intolerant to statins, hypothyroidism, GERD, fibromyalgia,  colon cancer s/p resection and chemotherapy, prior GI bleed, seizure disorder, memory loss, and prior tobacco use who was recently admitted earlier this week for recurrent chest pain. Case was reviewed with primary Cardiologist (Dr. [name]) who felt it was likely due to known diffuse CAD. Invasive evaluation was not recommended so she was started on low dose Imdur with improvement in symptoms. She was discharged on 11/10/2021 but returned to the ED later that night for recurrent chest pain and was readmitted. ? ?Assessment & Plan  ?  ?1.CAD/NSTEMI ?- extensive CAD history as reported above ?- discharged earlier in the week with chest pain, plan was for medical management ?- readmitted with recurrent chest pain, elevated troponin ?- cath yesterday: LM normal, ostial LAD 60% and prox 100%, D1 85%, ostial ramus 75% with ISR and also ramus 95% lesion, small severe diffusesly diseased LCX, RCA normal. Graft to RCA occluded, LIMA-LAD patent ?- DES x 2 to ramus  ?- from notes antianginals limited by orthostatic symptoms ?- intolerant to statins, has been on bempedioic acid/zetia and referred to  lipid clinic ? ?- medical therapy with ASA 81, imdur 15, effient 10  ?- f/u echo ? ? ?2.Anemia ?- April Hgb 13.8. This month trending down 11.5-->9.4-->8.6-->8.3.  ?- Hgb was 8.6 prior to cath, no concerns

## 2021-11-12 NOTE — Progress Notes (Signed)
?  Echocardiogram ?2D Echocardiogram has been performed. ? ?Rachel Clark ?11/12/2021, 10:31 AM ?

## 2021-11-12 NOTE — Progress Notes (Signed)
CARDIAC REHAB PHASE I  ? ?PRE:  Rate/Rhythm: 8 SR with PVCs, bigeminy at times ? ?  BP: lying 108/61, sitting 107/67, standing 101/59 ? ?  SaO2: 100 RA ? ?MODE:  Ambulation: 380 ft ? ?POST:  Rate/Rhythm: 123 ST ? ?  BP: sitting in hall 122/62, after walk sitting 142/67  ? ?  SaO2: 94 RA ? ?Pt without CP this am but does st she feels "wonky". She historically has orthostasis. No orthostasis this am but did want to sit after walking 190 ft due to feeling "wonky". BP stable sitting in hall but HR does increase with standing and walking, highest 123 ST.  ? ?Discussed with pt and husband MI, stent/PCIs, importance of Effient, restrictions, diet, exercise, NTG and CRPII. Very receptive. She is interested in doing CRPII again and will refer to [facility]. Gave brochure for [employer]. ?[phone] ? ?Yves Dill CES, ACSM ?11/12/2021 ?8:58 AM ? ? ? ? ?

## 2021-11-13 ENCOUNTER — Inpatient Hospital Stay (HOSPITAL_COMMUNITY): Payer: Medicare Other | Admitting: Anesthesiology

## 2021-11-13 ENCOUNTER — Encounter (HOSPITAL_COMMUNITY)
Admission: EM | Disposition: A | Discharge status: Home / Self Care | Payer: Self-pay | Source: Home / Self Care | Attending: Cardiology

## 2021-11-13 ENCOUNTER — Encounter (HOSPITAL_COMMUNITY): Payer: Self-pay | Admitting: Internal Medicine

## 2021-11-13 DIAGNOSIS — K259 Gastric ulcer, unspecified as acute or chronic, without hemorrhage or perforation: Secondary | ICD-10-CM

## 2021-11-13 DIAGNOSIS — I25119 Atherosclerotic heart disease of native coronary artery with unspecified angina pectoris: Secondary | ICD-10-CM

## 2021-11-13 DIAGNOSIS — K2970 Gastritis, unspecified, without bleeding: Secondary | ICD-10-CM

## 2021-11-13 DIAGNOSIS — D509 Iron deficiency anemia, unspecified: Secondary | ICD-10-CM

## 2021-11-13 DIAGNOSIS — I214 Non-ST elevation (NSTEMI) myocardial infarction: Secondary | ICD-10-CM | POA: Diagnosis not present

## 2021-11-13 DIAGNOSIS — K922 Gastrointestinal hemorrhage, unspecified: Secondary | ICD-10-CM

## 2021-11-13 HISTORY — PX: ESOPHAGOGASTRODUODENOSCOPY (EGD) WITH PROPOFOL: SHX5813

## 2021-11-13 LAB — CBC
HCT: 21.5 % — ABNORMAL LOW (ref 36.0–46.0)
Hemoglobin: 7.3 g/dL — ABNORMAL LOW (ref 12.0–15.0)
MCH: 29 pg (ref 26.0–34.0)
MCHC: 34 g/dL (ref 30.0–36.0)
MCV: 85.3 fL (ref 80.0–100.0)
Platelets: 237 10*3/uL (ref 150–400)
RBC: 2.52 MIL/uL — ABNORMAL LOW (ref 3.87–5.11)
RDW: 14 % (ref 11.5–15.5)
WBC: 7.5 10*3/uL (ref 4.0–10.5)
nRBC: 0 % (ref 0.0–0.2)

## 2021-11-13 LAB — HEMOGLOBIN AND HEMATOCRIT, BLOOD
HCT: 29.6 % — ABNORMAL LOW (ref 36.0–46.0)
Hemoglobin: 10.3 g/dL — ABNORMAL LOW (ref 12.0–15.0)

## 2021-11-13 LAB — LIPOPROTEIN A (LPA): Lipoprotein (a): 142.7 nmol/L — ABNORMAL HIGH (ref <75.0)

## 2021-11-13 LAB — PREPARE RBC (CROSSMATCH)

## 2021-11-13 SURGERY — ESOPHAGOGASTRODUODENOSCOPY (EGD) WITH PROPOFOL
Anesthesia: Monitor Anesthesia Care

## 2021-11-13 MED ORDER — SODIUM CHLORIDE 0.9% IV SOLUTION: Freq: Once | INTRAVENOUS | Status: AC

## 2021-11-13 MED ORDER — PROPOFOL 10 MG/ML IV BOLUS
INTRAVENOUS | Status: DC | PRN
  Administered 2021-11-13: 20 mg via INTRAVENOUS
  Administered 2021-11-13: 30 mg via INTRAVENOUS
  Administered 2021-11-13: 20 mg via INTRAVENOUS

## 2021-11-13 MED ORDER — PHENYLEPHRINE 80 MCG/ML (10ML) SYRINGE FOR IV PUSH (FOR BLOOD PRESSURE SUPPORT)
PREFILLED_SYRINGE | INTRAVENOUS | Status: DC | PRN
Start: 2021-11-13 — End: 2021-11-13
  Administered 2021-11-13: 80 ug via INTRAVENOUS

## 2021-11-13 MED ORDER — LACTATED RINGERS IV SOLN
INTRAVENOUS | Status: AC | PRN
Start: 2021-11-13 — End: 2021-11-13
  Administered 2021-11-13: 1000 mL via INTRAVENOUS

## 2021-11-13 MED ORDER — SODIUM CHLORIDE 0.9 % IV SOLN: INTRAVENOUS | Status: DC

## 2021-11-13 SURGICAL SUPPLY — 15 items

## 2021-11-13 NOTE — Consult Note (Signed)
Referring Provider: Cardiology ?Primary Care Physician:  Harlan Stains, MD ?Primary Gastroenterologist:  Dr. Michail Sermon ? ?Reason for Consultation: Anemia, history of GI bleed ? ?HPI: Rachel Clark is a 71 y.o. female with past medical history of coronary artery disease, history of PCI with stent placement in September 2022 admitted to the hospital with chest pain.  Underwent left heart cath and had  DES x 2 to ramus on Nov 11, 2021.She is currently on aspirin and Effient.  She was found to have anemia with drop in hemoglobin from 11.5 on admission to 8.6 yesterday.  Her hemoglobin was 13.8 in April but was in the range of 9-10 in September 2022.  It is possible that her normal hemoglobin in April was probably from hemoconcentration. ? ? ?She underwent EGD in September 2022 for coffee-ground emesis and melena and was found to have antral lesion with oozing of blood with possible visible vessel and which was treated with epinephrine injection and Endo Clip placement. ? ?Patient seen and examined at bedside.  She denies any GI symptoms.  Denies any blood in the stool or black stool.  Denies reflux, trouble swallowing or pain while swallowing.  Chest pain has improved. ? ?Past Medical History:  ?Diagnosis Date  ? Arthritis   ? "fingers" (06/29/2016)  ? Chronic lower back pain   ? Colon cancer ([place]) 12/04/2011  ? s/p Laparoscopic-assisted transverse colectomy on 12/19/2011 by Dr. Donne Hazel.  pT3 N0 M0.   ? Complete heart block ([place])   ? Coronary artery disease   ? Depression   ? Dyslipidemia   ? Fibromyalgia   ? Gastric ulcer   ? GERD (gastroesophageal reflux disease)   ? GI bleed   ? 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 last seizure was in 1972 ;takes Tegretol (06/29/2016)   ? Headache   ? "weekly" (06/29/2016)  ? Hyperlipidemia   ? Hyponatremia   ? Hypothyroid   ? Iron deficiency anemia 11/17/2011  ? Memory change 11/13/2016  ? Monoclonal gammopathy of unknown significance   ? Orthostatic hypotension    ? Presence of permanent cardiac pacemaker   ? Syncope and collapse   ? pacemaker implanted  ? ? ?Past Surgical History:  ?Procedure Laterality Date  ? ANTERIOR CERVICAL DECOMP/DISCECTOMY FUSION  1980's  ? BACK SURGERY    ? CARDIAC CATHETERIZATION  05/20/2009  ? obstructive native vessel disease in LAD, RCA, and first diagonal, patent vein graft to distal RCA and LIMA to LAD,normal. ef 60%  ? CARDIAC CATHETERIZATION N/A 03/24/2015  ? Procedure: Left Heart Cath and Cors/Grafts Angiography;  Surgeon: Wellington Hampshire, MD;  Location: [place] [facility];  Service: Cardiovascular;  Laterality: N/A;  ? CARDIAC CATHETERIZATION N/A 07/28/2015  ? Procedure: Left Heart Cath and Coronary Angiography;  Surgeon: Troy Sine, MD;  Location: [place];  Service: Cardiovascular;  Laterality: N/A;  ? CARDIAC CATHETERIZATION N/A 11/09/2015  ? Procedure: Coronary Stent Intervention;  Surgeon: Sherren Mocha, MD;  Location: [place];  Service: Cardiovascular;  Laterality: N/A;  ? CARDIAC CATHETERIZATION N/A 11/09/2015  ? Procedure: Left Heart Cath and Coronary Angiography;  Surgeon: Sherren Mocha, MD;  Location: [facility];  Service: Cardiovascular;  Laterality: N/A;  ? CARDIAC CATHETERIZATION N/A 06/29/2016  ? Procedure: Left Heart Cath and Coronary Angiography;  Surgeon: Nelva Bush, MD;  Location: [place] [facility];  Service: Cardiovascular;  Laterality: N/A;  ? CARDIAC CATHETERIZATION N/A 06/29/2016  ? Procedure: Intravascular Pressure Wire/FFR Study;  Surgeon: Nelva Bush, MD;  Location: [facility];  Service: Cardiovascular;  Laterality: N/A;  ? CARDIAC CATHETERIZATION N/A 06/29/2016  ? Procedure: Coronary Balloon Angioplasty;  Surgeon: Nelva Bush, MD;  Location: [facility];  Service: Cardiovascular;  Laterality: N/A;  ? COLON RESECTION  12/19/2011  ? Procedure: COLON RESECTION LAPAROSCOPIC;  Surgeon: Rolm Bookbinder, MD;  Location: [place];  Service: General;  Laterality: N/A;   laparoscopic hand assisted partial colon resection  ? COLON SURGERY    ? COLONOSCOPY    ? CORONARY ANGIOPLASTY WITH STENT PLACEMENT  ~ 2007  ? 1 stent  ? CORONARY ARTERY BYPASS GRAFT  2007  ? "CABG X2"  ? [place]  ? EP IMPLANTABLE DEVICE N/A 03/25/2015  ? MDT Advisa DR pacemaker implanted by Dr Rayann Heman for transient complete heart block and syncope  ? ESOPHAGOGASTRODUODENOSCOPY (EGD) WITH PROPOFOL N/A 03/14/2021  ? Procedure: ESOPHAGOGASTRODUODENOSCOPY (EGD) WITH PROPOFOL;  Surgeon: Ronnette Juniper, MD;  Location: [facility];  Service: Gastroenterology;  Laterality: N/A;  ? HEMOSTASIS CLIP PLACEMENT  03/14/2021  ? Procedure: HEMOSTASIS CLIP PLACEMENT;  Surgeon: Ronnette Juniper, MD;  Location: [place];  Service: Gastroenterology;;  ? INSERT / REPLACE / REMOVE PACEMAKER    ? LEFT HEART CATH AND CORS/GRAFTS ANGIOGRAPHY N/A 02/21/2018  ? Procedure: LEFT HEART CATH AND CORS/GRAFTS ANGIOGRAPHY;  Surgeon: Lorretta Harp, MD;  Location: [facility];  Service: Cardiovascular;  Laterality: N/A;  ? LEFT HEART CATH AND CORS/GRAFTS ANGIOGRAPHY N/A 03/08/2021  ? Procedure: LEFT HEART CATH AND CORS/GRAFTS ANGIOGRAPHY;  Surgeon: [name], Peter M, MD;  Location: [facility];  Service: Cardiovascular;  Laterality: N/A;  ? PORT-A-CATH REMOVAL N/A 06/12/2013  ? Procedure: REMOVAL PORT-A-CATH;  Surgeon: Rolm Bookbinder, MD;  Location: [facility];  Service: General;  Laterality: N/A;  ? PORTACATH PLACEMENT  06/04/2012  ? Procedure: INSERTION PORT-A-CATH;  Surgeon: Rolm Bookbinder, MD;  Location: [facility];  Service: General;  Laterality: N/A;  Insertion of port-a-cath   ? POSTERIOR LAMINECTOMY / DECOMPRESSION CERVICAL SPINE  1990s  ? SCLEROTHERAPY  03/14/2021  ? Procedure: SCLEROTHERAPY;  Surgeon: Ronnette Juniper, MD;  Location: [facility];  Service: Gastroenterology;;  ? VAGINAL HYSTERECTOMY    ? ? ?Prior to Admission medications   ?Medication Sig Start Date End Date Taking? Authorizing  Provider  ?acetaminophen (TYLENOL) 500 MG tablet Take 500-1,000 mg by mouth daily as needed for mild pain or headache.   Yes [provider]  ?aspirin 81 MG chewable tablet Chew 1 tablet (81 mg total) by mouth daily. 06/30/16  Yes Arbutus Leas, NP  ?Azelastine HCl 0.15 % SOLN Place 1 spray into both nostrils daily as needed (seasonal allergies). 12/22/14  Yes [provider]  ?Bempedoic Acid-Ezetimibe (NEXLIZET) 180-10 MG TABS Take 180 mg by mouth daily. 12/29/20  Yes [name], Peter M, MD  ?calcium carbonate (TUMS - DOSED IN MG ELEMENTAL CALCIUM) 500 MG chewable tablet Chew 500 mg by mouth daily.   Yes [provider]  ?carbamazepine (TEGRETOL XR) 200 MG 12 hr tablet Take 3 tablets (600 mg total) by mouth 2 (two) times daily. 09/13/21 12/07/22 Yes Penumalli, Earlean Polka, MD  ?Cholecalciferol (VITAMIN D) 50 MCG (2000 UT) CAPS Take 2,000 Units by mouth daily.   Yes [provider]  ?diphenhydrAMINE HCl (BENADRYL ALLERGY PO) Take 1 tablet by mouth daily as needed (allergy).   Yes [provider]  ?diphenhydramine-acetaminophen (TYLENOL PM) 25-500 MG TABS tablet Take 1  tablet by mouth at bedtime.   Yes [provider]  ?DULoxetine (CYMBALTA) 60 MG capsule Take 60 mg by mouth 2 (two) times daily.   Yes [provider]  ?ethosuximide (ZARONTIN) 250 MG capsule Take 2 capsules (500 mg total) by mouth 2 (two) times daily. 09/13/21 12/07/22 Yes Penumalli, Earlean Polka, MD  ?Evolocumab with Infusor ([place]) 420 MG/3.5ML SOCT Inject 420 mg into the skin every 30 (thirty) days. 11/07/21  Yes [name], Peter M, MD  ?Fluticasone Propionate [facility] ALLERGY RELIEF NA) Place 1 spray into the nose daily as needed (allergy relief).   Yes [provider]  ?isosorbide mononitrate (IMDUR) 30 MG 24 hr tablet Take 0.5 tablets (15 mg total) by mouth daily. 11/11/21  Yes Margie Billet, NP  ?levothyroxine (SYNTHROID, LEVOTHROID) 50 MCG tablet Take 50 mcg by mouth daily before  breakfast.   Yes [provider]  ?memantine (NAMENDA) 10 MG tablet Take 1 tablet (10 mg total) by mouth 2 (two) times daily. 09/13/21  Yes Penumalli, Earlean Polka, MD  ?nitroGLYCERIN (NITROSTA

## 2021-11-13 NOTE — Anesthesia Procedure Notes (Signed)
Procedure Name: [place] ?Date/Time: 11/13/2021 1:35 PM ?Performed by: Jenne Campus, CRNA ?Pre-anesthesia Checklist: Patient identified, Emergency Drugs available, Suction available and Patient being monitored ?Oxygen Delivery Method: Nasal cannula ? ? ? ? ?

## 2021-11-13 NOTE — Progress Notes (Signed)
Patient returned from EGD at 1455hrs.  First unit of PRBCs completed in endoscopy.  Will start second unit. Husband updated on EGD.  ?

## 2021-11-13 NOTE — Anesthesia Postprocedure Evaluation (Signed)
Anesthesia Post Note ? ?Patient: Rachel Clark ? ?Procedure(s) Performed: ESOPHAGOGASTRODUODENOSCOPY (EGD) WITH PROPOFOL ? ?  ? ?Patient location during evaluation: PACU ?Anesthesia Type: MAC ?Level of consciousness: awake ?Pain management: pain level controlled ?Vital Signs Assessment: post-procedure vital signs reviewed and stable ?Respiratory status: spontaneous breathing, nonlabored ventilation, respiratory function stable and patient connected to nasal cannula oxygen ?Cardiovascular status: stable and blood pressure returned to baseline ?Postop Assessment: no apparent nausea or vomiting ?Anesthetic complications: no ? ? ?No notable events documented. ? ?Last Vitals:  ?Vitals:  ? 11/13/21 1509 11/13/21 1533  ?BP: 121/62 (!) 127/53  ?Pulse: 76 74  ?Resp: 20 (!) 22  ?Temp: 36.4 ?C (!) 36.4 ?C  ?SpO2: 100% 100%  ?  ?Last Pain:  ?Vitals:  ? 11/13/21 1533  ?TempSrc: Oral  ?PainSc:   ? ? ?  ?  ?  ?  ?  ?  ? ?Carter Kassel P Yaritza Leist ? ? ? ? ?

## 2021-11-13 NOTE — Brief Op Note (Signed)
11/10/2021 - 11/13/2021 ? ?1:51 PM ? ?PATIENT:  Rachel Clark  71 y.o. female ? ?PRE-OPERATIVE DIAGNOSIS:  Anemia ? ?POST-OPERATIVE DIAGNOSIS:  gastric ulcer and gastritis ? ?PROCEDURE:  Procedure(s): ?ESOPHAGOGASTRODUODENOSCOPY (EGD) WITH PROPOFOL (N/A) ? ?SURGEON:  Surgeon(s) and Role: ?   * Otis Brace, MD - Primary ? ?Findings ?----------- ?-EGD showed small, clean-based prepyloric gastric ulcer.  No evidence of active bleeding.  EGD also showed some gastritis. ? ?Recommendation ?----------------------- ?-Recommend IV Protonix 40 mg twice a day while in hospital.  Switch to p.o. Protonix 40 mg once a day for 4 weeks followed by Protonix 40 mg once a day indefinitely. ?-Avoid NSAIDs ?-Diet and advance as tolerated ?-Monitor H&H ?-No further inpatient GI work-up planned.  GI will sign off.  Call us back if needed  ?-Follow-up in GI clinic in 3 to 6 months after discharge ? ?Otis Brace MD, FACP ?11/13/2021, 1:52 PM ? ?Contact #  [phone]  ? ?

## 2021-11-13 NOTE — Progress Notes (Signed)
Patient off unit for EGD at 1300hrs.  ?

## 2021-11-13 NOTE — Op Note (Signed)
[facility] ?Patient Name: Rachel Clark ?Procedure Date : 11/13/2021 ?MRN: [id number] ?Attending MD: Otis Brace , MD ?Date of Birth: [birth year] ?CSN: [id number] ?Age: 71 ?Admit Type: Inpatient ?Procedure:                Upper GI endoscopy ?Indications:              Iron deficiency anemia due to suspected upper  ?                          gastrointestinal bleeding ?Providers:                Otis Brace, MD, Benetta Spar, Technician,  ?                          Carlyn Reichert, RN ?Referring MD:              ?Medicines:                Sedation Administered by an Anesthesia Professional ?Complications:            No immediate complications. ?Estimated Blood Loss:     Estimated blood loss was minimal. ?Procedure:                Pre-Anesthesia Assessment: ?                          - Prior to the procedure, a History and Physical  ?                          was performed, and patient medications and  ?                          allergies were reviewed. The patient's tolerance of  ?                          previous anesthesia was also reviewed. The risks  ?                          and benefits of the procedure and the sedation  ?                          options and risks were discussed with the patient.  ?                          All questions were answered, and informed consent  ?                          was obtained. Prior Anticoagulants: The patient has  ?                          taken Effient (prasugrel), last dose was day of  ?                          procedure. ASA Grade Assessment: III - A patient  ?  with severe systemic disease. After reviewing the  ?                          risks and benefits, the patient was deemed in  ?                          satisfactory condition to undergo the procedure. ?                          After obtaining informed consent, the endoscope was  ?                          passed under direct vision. Throughout the  ?                           procedure, the patient's blood pressure, pulse, and  ?                          oxygen saturations were monitored continuously. The  ?                          GIF-H190 ([id number]) Olympus endoscope was introduced  ?                          through the mouth, and advanced to the second part  ?                          of duodenum. The upper GI endoscopy was  ?                          accomplished without difficulty. The patient  ?                          tolerated the procedure well. ?Scope In: ?Scope Out: ?Findings: ?     The Z-line was regular and was found 38 cm from the incisors. ?     One non-bleeding superficial gastric ulcer with pigmented material was  ?     found in the prepyloric region of the stomach. The lesion was 4 mm in  ?     largest dimension. ?     Scattered mild inflammation characterized by congestion (edema) and  ?     erosions was found in the prepyloric region of the stomach. ?     The cardia and gastric fundus were normal on retroflexion. ?     The duodenal bulb, first portion of the duodenum and second portion of  ?     the duodenum were normal. ?Impression:               - Z-line regular, 38 cm from the incisors. ?                          - Non-bleeding gastric ulcer with pigmented  ?                          material. ?                          -  Gastritis. ?                          - Normal duodenal bulb, first portion of the  ?                          duodenum and second portion of the duodenum. ?                          - No specimens collected. ?Recommendation:           - Return patient to hospital ward for ongoing care. ?                          - Soft diet. ?                          - Continue present medications. ?Procedure Code(s):        --- Professional --- ?                          [phone], Esophagogastroduodenoscopy, flexible,  ?                          transoral; diagnostic, including collection of  ?                          specimen(s) by brushing or  washing, when performed  ?                          (separate procedure) ?Diagnosis Code(s):        --- Professional --- ?                          K25.9, Gastric ulcer, unspecified as acute or  ?                          chronic, without hemorrhage or perforation ?                          K29.70, Gastritis, unspecified, without bleeding ?                          D50.9, Iron deficiency anemia, unspecified ?CPT copyright 2019 American Medical Association. All rights reserved. ?The codes documented in this report are preliminary and upon coder review may  ?be revised to meet current compliance requirements. ?Otis Brace, MD ?Otis Brace, MD ?11/13/2021 1:50:56 PM ?Number of Addenda: 0 ?

## 2021-11-13 NOTE — Transfer of Care (Signed)
Immediate Anesthesia Transfer of Care Note ? ?Patient: Rachel Clark ? ?Procedure(s) Performed: ESOPHAGOGASTRODUODENOSCOPY (EGD) WITH PROPOFOL ? ?Patient Location: PACU ? ?Anesthesia Type:MAC ? ?Level of Consciousness: awake, oriented and patient cooperative ? ?Airway & Oxygen Therapy: Patient Spontanous Breathing and Patient connected to nasal cannula oxygen ? ?Post-op Assessment: Report given to RN and Post -op Vital signs reviewed and stable ? ?Post vital signs: Reviewed ? ?Last Vitals:  ?Vitals Value Taken Time  ?BP    ?Temp    ?Pulse 79 11/13/21 1357  ?Resp 23 11/13/21 1357  ?SpO2 100 % 11/13/21 1357  ?Vitals shown include unvalidated device data. ? ?Last Pain:  ?Vitals:  ? 11/13/21 1311  ?TempSrc: Temporal  ?PainSc: 0-No pain  ?   ? ?Patients Stated Pain Goal: 0 (11/11/21 1220) ? ?Complications: No notable events documented. ?

## 2021-11-13 NOTE — Progress Notes (Signed)
? ?Progress Note ? ?Patient Name: Rachel Clark ?Date of Encounter: 11/13/2021 ? ?[facility] Cardiologist: Peter [name], MD  ? ?Subjective  ? ?No complaints ? ?Inpatient Medications  ?  ?Scheduled Meds: ? sodium chloride   Intravenous Once  ? aspirin  81 mg Oral Daily  ? calcium carbonate  500 mg Oral Daily  ? carbamazepine  600 mg Oral BID  ? cholecalciferol  2,000 Units Oral Daily  ? diphenhydrAMINE  25 mg Oral QHS  ? DULoxetine  60 mg Oral BID  ? ethosuximide  500 mg Oral BID  ? ezetimibe  10 mg Oral Daily  ? isosorbide mononitrate  15 mg Oral Daily  ? levothyroxine  50 mcg Oral Q0600  ? memantine  10 mg Oral BID  ? pantoprazole (PROTONIX) IV  40 mg Intravenous Q12H  ? prasugrel  10 mg Oral Daily  ? sodium chloride flush  3 mL Intravenous Q12H  ? ?Continuous Infusions: ? sodium chloride    ? sodium chloride    ? ?PRN Meds: ?sodium chloride, acetaminophen, azelastine, fluticasone, nitroGLYCERIN, ondansetron (ZOFRAN) IV, sodium chloride flush, zolpidem  ? ?Vital Signs  ?  ?Vitals:  ? 11/12/21 0445 11/12/21 1342 11/12/21 2100 11/13/21 0551  ?BP: 114/61  130/64 115/63  ?Pulse: 86  89 86  ?Resp: '17 18 18   '$ ?Temp: 97.9 ?F (36.6 ?C) 98.1 ?F (36.7 ?C) 98 ?F (36.7 ?C) 98.5 ?F (36.9 ?C)  ?TempSrc: Oral Oral Oral Oral  ?SpO2: 96% 100% 100% 100%  ?Weight:      ?Height:      ? ?No intake or output data in the 24 hours ending 11/13/21 0845 ? ?  11/11/2021  ? 12:47 AM 11/11/2021  ? 12:00 AM 11/09/2021  ?  6:21 PM  ?Last 3 Weights  ?Weight (lbs) 176 lb 4.8 oz 194 lb 3.6 oz 194 lb 3.6 oz  ?Weight (kg) 79.969 kg 88.1 kg 88.1 kg  ?   ? ?Telemetry  ?  ?SR - Personally Reviewed ? ?ECG  ?  ?N/a - Personally Reviewed ? ?Physical Exam  ? ?GEN: No acute distress.   ?Neck: No JVD ?Cardiac: RRR, no murmurs, rubs, or gallops.  ?Respiratory: Clear to auscultation bilaterally. ?GI: Soft, nontender, non-distended  ?MS: No edema; No deformity. ?Neuro:  Nonfocal  ?Psych: Normal affect  ? ?Labs  ?  ?High Sensitivity Troponin:   ?Recent Labs   ?Lab 11/09/21 ?1947 11/10/21 ?0321 11/10/21 ?0500 11/10/21 ?1843 11/10/21 ?2050  ?TROPONINIHS 41* 38* 31* 27* 259*  ?   ?Chemistry ?Recent Labs  ?Lab 11/10/21 ?1843 11/11/21 ?9629 11/12/21 ?0351  ?NA 132* 132* 132*  ?K 4.2 4.3 4.1  ?CL 102 101 102  ?CO2 '24 25 25  '$ ?GLUCOSE 132* 115* 106*  ?BUN 26* 17 12  ?CREATININE 0.83 0.74 0.74  ?CALCIUM 8.6* 8.3* 8.4*  ?GFRNONAA >60 >60 >60  ?ANIONGAP '6 6 5  '$ ?  ?Lipids No results for input(s): CHOL, TRIG, HDL, LABVLDL, LDLCALC, CHOLHDL in the last 168 hours.  ?Hematology ?Recent Labs  ?Lab 11/11/21 ?0826 11/12/21 ?0351 11/12/21 ?0945 11/13/21 ?0311  ?WBC 7.9 8.5  --  7.5  ?RBC 2.98* 2.88* 2.96* 2.52*  ?HGB 8.6* 8.3*  --  7.3*  ?HCT 25.8* 24.9*  --  21.5*  ?MCV 86.6 86.5  --  85.3  ?MCH 28.9 28.8  --  29.0  ?MCHC 33.3 33.3  --  34.0  ?RDW 13.4 13.7  --  14.0  ?PLT 261 258  --  237  ? ?  Thyroid  ?Recent Labs  ?Lab 11/10/21 ?0321  ?TSH 1.782  ?  ?BNPNo results for input(s): BNP, PROBNP in the last 168 hours.  ?DDimer No results for input(s): DDIMER in the last 168 hours.  ? ?Radiology  ?  ?CARDIAC CATHETERIZATION ? ?Result Date: 11/11/2021 ?  Ost LAD to Prox LAD lesion is 60% stenosed. Prox LAD lesion is 100% stenosed.   LIMA-graft was visualized by angiography and is normal in caliber.  The graft exhibits no disease.   SVG-dRCA: Origin lesion is 100% stenosed.  graft was not visualized due to known occlusion.   Ost RCA to Mid RCA overlapping full metal jacket DES stents, widely patent.   Ost 1st Diag to 1st Diag lesion is 85% stenosed.   ----------------------   Lesion segment #2 (all in-stent restenosis) Ost Ramus to Ramus lesion is 75% stenosed.  Ramus-2 lesion is 85% stenosed.  Ramus-1 lesion is 35% stenosed.   Scoring balloon angioplasty was performed using a BALLN SCOREFLEX 2.50X15 -> followed by post dilation with a [place] Balloon 3.0 mm x 15 mm   Post intervention, there is a 20% residual stenosis  in the proximal portion of stent segment.  Post intervention, there is a 0%  residual stenosis in the distal portion of the stent.   CULPRIT LESION #1: Ramus-3 lesion is 95% stenosed.-2 tandem locations of calcified stenosis.   Scoring balloon angioplasty was performed using a BALLN SCOREFLEX 2.50X15.   A drug-eluting stent was successfully placed (overlapping the distal stent) using a STENT ONYX FRONTIER 2.5X30 -> tapered postdilation from overlap 3.1 mm down to 2.8 mm distally.   Post intervention, there is a 0% residual stenosis.   --------------------------------------------------------------------------- ---   LV end diastolic pressure is normal.   There is no aortic valve stenosis. SUMMARY Severe Native vessel CAD: stable severe proximal to mid LAD disease with LAD occlusion and patent LIMA-LAD, widely patent full metal jacket stented RCA, diffuse severe in-stent restenosis throughout the proximal and midportion of the stented segment of the native LCx with severe eccentric calcified lesions distal to the stent. Difficult/complex but successful scoring balloon angioplasty with shockwave lithotripsy followed by distal overlapping DES stent placement (Onyx Frontier DES 2.5 x 30 tapered postdilation from 3.1 in the overlap segment to 2.8 distally) Scoreflex angioplasty followed by post dilation with 3.0 mm [place] balloon throughout the entire stented segment of the LCx reducing stenosis in the midportion to 10% and more proximally to 20%. Normal LVEDP RECOMMENDATION Monitor overnight, anticipate discharge in the morning. Aggressive risk factor modification Essentially lifelong finding.  In coverage with DAPT x1 year Glenetta Hew, MD ? ?ECHOCARDIOGRAM COMPLETE ? ?Result Date: 11/12/2021 ?   ECHOCARDIOGRAM REPORT   Patient Name:   Rachel Clark Date of Exam: 11/12/2021 Medical Rec #:  [id number]         Height:       67.0 in Accession #:    [id number]        Weight:       176.3 lb Date of Birth:  1951-01-07        BSA:          1.917 m? Patient Age:    71 years          BP:           114/61  mmHg Patient Gender: F                 HR:  98 bpm. Exam Location:  Inpatient Procedure: 2D Echo, Cardiac Doppler and Color Doppler Indications:    NSTEMI  History:        Patient has prior history of Echocardiogram examinations, most                 recent 07/30/2020. CAD, Pacemaker; Risk Factors:Hypertension,                 Dyslipidemia and Former Smoker. Sick sinus sydrome.  Sonographer:    Clayton Lefort RDCS (AE) Referring Phys: [id number] FAN YE IMPRESSIONS  1. Left ventricular ejection fraction, by estimation, is 60 to 65%. The left ventricle has normal function. The left ventricle has no regional wall motion abnormalities. There is mild left ventricular hypertrophy. Left ventricular diastolic parameters are indeterminate.  2. Right ventricular systolic function is normal. The right ventricular size is normal. Tricuspid regurgitation signal is inadequate for assessing PA pressure.  3. The mitral valve is abnormal. Mild mitral valve regurgitation. No evidence of mitral stenosis.  4. The aortic valve is tricuspid. Aortic valve regurgitation is not visualized. No aortic stenosis is present.  5. The inferior vena cava is normal in size with greater than 50% respiratory variability, suggesting right atrial pressure of 3 mmHg. FINDINGS  Left Ventricle: Left ventricular ejection fraction, by estimation, is 60 to 65%. The left ventricle has normal function. The left ventricle has no regional wall motion abnormalities. The left ventricular internal cavity size was normal in size. There is  mild left ventricular hypertrophy. Left ventricular diastolic parameters are indeterminate. Right Ventricle: The right ventricular size is normal. Right vetricular wall thickness was not well visualized. Right ventricular systolic function is normal. Tricuspid regurgitation signal is inadequate for assessing PA pressure. Left Atrium: Left atrial size was normal in size. Right Atrium: Right atrial size was normal in size.  Pericardium: There is no evidence of pericardial effusion. Mitral Valve: The mitral valve is abnormal. Mild mitral valve regurgitation. No evidence of mitral valve stenosis. Tricuspid Valve: The tricuspid valve is nor

## 2021-11-13 NOTE — Anesthesia Preprocedure Evaluation (Addendum)
Anesthesia Evaluation  ?Patient identified by MRN, date of birth, ID band ?Patient awake ? ? ? ?Reviewed: ?Allergy & Precautions, NPO status , Patient's Chart, lab work & pertinent test results ? ?Airway ?Mallampati: II ? ?TM Distance: >3 FB ?Neck ROM: Full ? ? ? Dental ?no notable dental hx. ?(+) Dental Advisory Given ?  ?Pulmonary ?former smoker,  ?  ?Pulmonary exam normal ? ? ? ? ? ? ? Cardiovascular ?hypertension, + angina + CAD, + Past MI, + Cardiac Stents and + CABG  ?Normal cardiovascular exam+ dysrhythmias + pacemaker  ? ? ?  ?Neuro/Psych ? Headaches, Seizures -,  PSYCHIATRIC DISORDERS Depression  Neuromuscular disease   ? GI/Hepatic ?Neg liver ROS, PUD, GERD  Medicated and Controlled,  ?Endo/Other  ?Hypothyroidism  ? Renal/GU ?negative Renal ROS  ? ?  ?Musculoskeletal ? ?(+) Arthritis , Fibromyalgia - ? Abdominal ?  ?Peds ? Hematology ? ?(+) Blood dyscrasia, anemia ,   ?Anesthesia Other Findings ?Anemia ? Reproductive/Obstetrics ? ?  ? ? ? ? ? ? ? ? ? ? ? ? ? ?  ?  ? ? ? ? ? ? ?Anesthesia Physical ?Anesthesia Plan ? ?ASA: 4 ? ?Anesthesia Plan: MAC  ? ?Post-op Pain Management:   ? ?Induction: Intravenous ? ?PONV Risk Score and Plan: 2 and Propofol infusion and Treatment may vary due to age or medical condition ? ?Airway Management Planned: Nasal Cannula ? ?Additional Equipment:  ? ?Intra-op Plan:  ? ?Post-operative Plan:  ? ?Informed Consent: I have reviewed the patients History and Physical, chart, labs and discussed the procedure including the risks, benefits and alternatives for the proposed anesthesia with the patient or authorized representative who has indicated his/her understanding and acceptance.  ? ? ? ?Dental advisory given ? ?Plan Discussed with: CRNA ? ?Anesthesia Plan Comments:   ? ? ? ? ? ?Anesthesia Quick Evaluation ? ?

## 2021-11-14 ENCOUNTER — Encounter (HOSPITAL_COMMUNITY): Payer: Self-pay | Admitting: Cardiology

## 2021-11-14 ENCOUNTER — Ambulatory Visit: Payer: Medicare Other | Admitting: Internal Medicine

## 2021-11-14 DIAGNOSIS — I214 Non-ST elevation (NSTEMI) myocardial infarction: Secondary | ICD-10-CM | POA: Diagnosis not present

## 2021-11-14 DIAGNOSIS — D500 Iron deficiency anemia secondary to blood loss (chronic): Secondary | ICD-10-CM | POA: Diagnosis not present

## 2021-11-14 DIAGNOSIS — Z950 Presence of cardiac pacemaker: Secondary | ICD-10-CM | POA: Diagnosis not present

## 2021-11-14 LAB — POCT ACTIVATED CLOTTING TIME
Activated Clotting Time: 221 seconds
Activated Clotting Time: 179 seconds
Activated Clotting Time: 191 seconds

## 2021-11-14 LAB — BPAM RBC
Blood Product Expiration Date: 202305182359
Blood Product Expiration Date: 202305192359
ISSUE DATE / TIME: 202305141126
ISSUE DATE / TIME: 202305141513
Unit Type and Rh: 9500
Unit Type and Rh: 9500

## 2021-11-14 LAB — TYPE AND SCREEN
ABO/RH(D): O NEG
Antibody Screen: NEGATIVE
Unit division: 0
Unit division: 0

## 2021-11-14 LAB — BASIC METABOLIC PANEL
Anion gap: 6 (ref 5–15)
BUN: 16 mg/dL (ref 8–23)
CO2: 24 mmol/L (ref 22–32)
Calcium: 8.1 mg/dL — ABNORMAL LOW (ref 8.9–10.3)
Chloride: 103 mmol/L (ref 98–111)
Creatinine, Ser: 0.78 mg/dL (ref 0.44–1.00)
GFR, Estimated: >60 mL/min (ref >60)
Glucose, Bld: 128 mg/dL — ABNORMAL HIGH (ref 70–99)
Potassium: 3.7 mmol/L (ref 3.5–5.1)
Sodium: 133 mmol/L — ABNORMAL LOW (ref 135–145)

## 2021-11-14 LAB — CBC
HCT: 29.1 % — ABNORMAL LOW (ref 36.0–46.0)
Hemoglobin: 10.2 g/dL — ABNORMAL LOW (ref 12.0–15.0)
MCH: 30.4 pg (ref 26.0–34.0)
MCHC: 35.1 g/dL (ref 30.0–36.0)
MCV: 86.6 fL (ref 80.0–100.0)
Platelets: 236 10*3/uL (ref 150–400)
RBC: 3.36 MIL/uL — ABNORMAL LOW (ref 3.87–5.11)
RDW: 14.4 % (ref 11.5–15.5)
WBC: 8.2 10*3/uL (ref 4.0–10.5)
nRBC: 0 % (ref 0.0–0.2)

## 2021-11-14 LAB — MAGNESIUM: Magnesium: 2 mg/dL (ref 1.7–2.4)

## 2021-11-14 MED ORDER — PANTOPRAZOLE SODIUM 40 MG PO TBEC: DELAYED_RELEASE_TABLET | ORAL | 0 refills | Status: DC

## 2021-11-14 MED ORDER — PANTOPRAZOLE SODIUM 40 MG PO TBEC
40.0000 mg | DELAYED_RELEASE_TABLET | Freq: Two times a day (BID) | ORAL | Status: DC
  Administered 2021-11-14: 40 mg via ORAL
  Filled 2021-11-14: qty 1

## 2021-11-14 MED ORDER — POTASSIUM CHLORIDE CRYS ER 20 MEQ PO TBCR
40.0000 meq | EXTENDED_RELEASE_TABLET | Freq: Once | ORAL | Status: AC
  Administered 2021-11-14: 40 meq via ORAL
  Filled 2021-11-14: qty 2

## 2021-11-14 MED FILL — Verapamil HCl IV Soln 2.5 MG/ML: INTRAVENOUS | Qty: 2 | Status: AC

## 2021-11-14 MED FILL — Heparin Sodium (Porcine) Inj 1000 Unit/ML: INTRAMUSCULAR | Qty: 10 | Status: AC

## 2021-11-14 NOTE — Care Management Important Message (Signed)
Important Message ? ?Patient Details  ?Name: Rachel Clark ?MRN: [id number] ?Date of Birth: 12/12/1950 ? ? ?Medicare Important Message Given:  Yes ? ? ? ? ?Shelda Altes ?11/14/2021, 11:06 AM ?

## 2021-11-14 NOTE — Progress Notes (Incomplete)
?Cardiology Office Note:   ? ?Date:  11/14/2021  ? ?ID:  Rachel Clark, DOB 06/11/1951, MRN [id number] ? ?PCP:  Harlan Stains, MD  ?Cardiologist:  Peter [name], MD  ?Electrophysiologist:  None  ? ?Referring MD: Harlan Stains, MD  ? ?Chief Complaint/Reason for Referral: ?Hospital Follow-up ? ?History of Present Illness:   ? ?Rachel Clark is a 71 y.o. 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 She is a patient of Dr. [name].  ? ?On 03/25/2015 she underwent successful implantation of a Medtronic Advisa DR MRI SureScan dual-chamber pacemaker for transient syncope and AV block. ? ?She was admitted to the hospital 11/09/2021 for evaluation of chest pain. She had 2 episodes of chest pain improved on 1 nitroglycerin and aspirin. ? ?Today: ?*** ? ?The patient denies chest pain, chest pressure, dyspnea at rest or with exertion, palpitations, PND, orthopnea, or leg swelling. Denies cough, fever, chills. Denies nausea, vomiting. Denies syncope or presyncope. Denies dizziness or lightheadedness. Denies snoring. ? ?(+) ? ? ?Past Medical History:  ?Diagnosis Date  ? Arthritis   ? "fingers" (06/29/2016)  ? Chronic lower back pain   ? Colon cancer ([place]) 12/04/2011  ? s/p Laparoscopic-assisted transverse colectomy on 12/19/2011 by Dr. Donne Hazel.  pT3 N0 M0.   ? Complete heart block ([place])   ? Coronary artery disease   ? Depression   ? Dyslipidemia   ? Fibromyalgia   ? Gastric ulcer   ? GERD (gastroesophageal reflux disease)   ? GI bleed   ? 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 last seizure was in 1972 ;takes Tegretol  (06/29/2016)   ? Headache   ? "weekly" (06/29/2016)  ? Hyperlipidemia   ? Hyponatremia   ? Hypothyroid   ? Iron deficiency anemia 11/17/2011  ? Memory change 11/13/2016  ? Monoclonal gammopathy of unknown significance   ? Orthostatic hypotension   ? Presence of permanent cardiac pacemaker   ? Syncope and collapse   ? pacemaker implanted  ? ? ?Past Surgical History:  ?Procedure Laterality Date  ? ANTERIOR CERVICAL DECOMP/DISCECTOMY FUSION  1980's  ? BACK SURGERY    ? CARDIAC CATHETERIZATION  05/20/2009  ? obstructive native vessel disease in LAD, RCA, and first diagonal, patent vein graft to distal RCA and LIMA to LAD,normal. ef 60%  ? CARDIAC CATHETERIZATION N/A 03/24/2015  ? Procedure: Left Heart Cath and Cors/Grafts Angiography;  Surgeon: Wellington Hampshire, MD;  Location: [place] [facility];  Service: Cardiovascular;  Laterality: N/A;  ? CARDIAC CATHETERIZATION N/A 07/28/2015  ? Procedure: Left Heart Cath and Coronary Angiography;  Surgeon: Troy Sine, MD;  Location: [place] [facility];  Service: Cardiovascular;  Laterality: N/A;  ? CARDIAC CATHETERIZATION N/A 11/09/2015  ? Procedure: Coronary Stent Intervention;  Surgeon: Sherren Mocha, MD;  Location: [place] [place] [facility];  Service: Cardiovascular;  Laterality: N/A;  ? CARDIAC CATHETERIZATION N/A 11/09/2015  ? Procedure: Left Heart Cath and Coronary Angiography;  Surgeon: Sherren Mocha, MD;  Location: [place] [facility];  Service: Cardiovascular;  Laterality: N/A;  ? CARDIAC CATHETERIZATION N/A 06/29/2016  ? Procedure: Left Heart Cath and Coronary Angiography;  Surgeon: Nelva Bush, MD;  Location: [place];  Service: Cardiovascular;  Laterality: N/A;  ? CARDIAC CATHETERIZATION N/A 06/29/2016  ? Procedure: Intravascular Pressure Wire/FFR Study;  Surgeon: Nelva Bush, MD;  Location: [facility];  Service: Cardiovascular;  Laterality: N/A;  ? CARDIAC CATHETERIZATION N/A 06/29/2016  ? Procedure: Coronary Balloon Angioplasty;  Surgeon:  Nelva Bush, MD;  Location: [facility];  Service: Cardiovascular;  Laterality: N/A;  ? COLON RESECTION  12/19/2011  ? Procedure: COLON RESECTION LAPAROSCOPIC;  Surgeon: Rolm Bookbinder, MD;  Location: [place];  Service: General;  Laterality: N/A;  laparoscopic hand assisted partial colon resection  ? COLON SURGERY    ? COLONOSCOPY    ? CORONARY ANGIOPLASTY WITH STENT PLACEMENT  ~ 2007  ? 1 stent  ? CORONARY ARTERY BYPASS GRAFT  2007  ? "CABG X2"  ? [place]  ? EP IMPLANTABLE DEVICE N/A 03/25/2015  ? MDT Advisa DR pacemaker implanted by Dr Rayann Heman for transient complete heart block and syncope  ? ESOPHAGOGASTRODUODENOSCOPY (EGD) WITH PROPOFOL N/A 03/14/2021  ? Procedure: ESOPHAGOGASTRODUODENOSCOPY (EGD) WITH PROPOFOL;  Surgeon: Ronnette Juniper, MD;  Location: [place];  Service: Gastroenterology;  Laterality: N/A;  ? HEMOSTASIS CLIP PLACEMENT  03/14/2021  ? Procedure: HEMOSTASIS CLIP PLACEMENT;  Surgeon: Ronnette Juniper, MD;  Location: [facility];  Service: Gastroenterology;;  ? INSERT / REPLACE / REMOVE PACEMAKER    ? LEFT HEART CATH AND CORONARY ANGIOGRAPHY N/A 11/11/2021  ? Procedure: LEFT HEART CATH AND CORONARY ANGIOGRAPHY;  Surgeon: Leonie Man, MD;  Location: [place] [facility];  Service: Cardiovascular;  Laterality: N/A;  ? LEFT HEART CATH AND CORS/GRAFTS ANGIOGRAPHY N/A 02/21/2018  ? Procedure: LEFT HEART CATH AND CORS/GRAFTS ANGIOGRAPHY;  Surgeon: Lorretta Harp, MD;  Location: [facility];  Service: Cardiovascular;  Laterality: N/A;  ? LEFT HEART CATH AND CORS/GRAFTS ANGIOGRAPHY N/A 03/08/2021  ? Procedure: LEFT HEART CATH AND CORS/GRAFTS ANGIOGRAPHY;  Surgeon: [name], Peter M, MD;  Location: [facility];  Service: Cardiovascular;  Laterality: N/A;  ? PORT-A-CATH REMOVAL N/A 06/12/2013  ? Procedure: REMOVAL PORT-A-CATH;  Surgeon: Rolm Bookbinder, MD;  Location: [place];  Service: General;  Laterality: N/A;  ? PORTACATH PLACEMENT   06/04/2012  ? Procedure: INSERTION PORT-A-CATH;  Surgeon: Rolm Bookbinder, MD;  Location: [place];  Service: General;  Laterality: N/A;  Insertion of port-a-cath   ? POSTERIOR LAMINECTOMY / DECOMPRESSION CERVICAL SPINE  1990s  ? SCLEROTHERAPY  03/14/2021  ? Procedure: SCLEROTHERAPY;  Surgeon: Ronnette Juniper, MD;  Location: [facility] [place];  Service: Gastroenterology;;  ? VAGINAL HYSTERECTOMY    ? ? ?Current Medications: ?No outpatient medications have been marked as taking for the 11/14/21 encounter (Appointment) with Elouise Munroe, MD.  ?  ? ?Allergies:   Sulfamethoxazole, Aspirin, Crestor [rosuvastatin], Erythromycin, Lisinopril, Niacin and related, Penicillin g benzathine, Statins, Sulfa drugs cross reactors, Tetracyclines & related, Xeloda [capecitabine], Penicillins, and Plavix [clopidogrel bisulfate]  ? ?Social History  ? ?Tobacco Use  ? Smoking status: Former  ?  Packs/day: 0.30  ?  Years: 20.00  ?  Pack years: 6.00  ?  Types: Cigarettes  ?  Quit date: 06/06/1981  ?  Years since quitting: 40.4  ? Smokeless tobacco: Never  ?Vaping Use  ? Vaping Use: Never used  ?Substance Use Topics  ? Alcohol use: Yes  ?  Alcohol/week: 1.0 standard drink  ?  Types: 1 Glasses of wine per week  ?  Comment: occ  ? Drug use: No  ?  ? ?Family History: ?The patient's family history includes Cancer in her mother; Heart attack in her father; Heart attack (age of onset: 78) in her brother; Heart disease in her father. There is no history of Stroke. ? ?ROS:   ?Please see the history of present illness.    ? ?All other systems reviewed and are negative. ? ?EKGs/Labs/Other Studies Reviewed:   ? ?The following studies were reviewed today: ? ?Echo 11/12/2021: ? 1. Left ventricular ejection fraction, by estimation, is 60 to 65%. The  ?left ventricle has normal function. The left ventricle has no regional  ?wall motion abnormalities. There is mild left ventricular hypertrophy.  ?Left ventricular diastolic parameters  ?are indeterminate.  ? 2.  Right ventricular systolic function is normal. The right ventricular  ?size is normal. Tricuspid regurgitation signal is inadequate for assessing  ?PA pressure.  ? 3. The mitral valve is abnormal. Mild mitral valve reg

## 2021-11-14 NOTE — Progress Notes (Signed)
? ?Progress Note ? ?Patient Name: Rachel Clark ?Date of Encounter: 11/14/2021 ? ?[facility] Cardiologist: Peter [name], MD  ? ?Subjective  ? ?No chest pain, feeling much better today. Husband at the bedside. ? ?Inpatient Medications  ?  ?Scheduled Meds: ? aspirin  81 mg Oral Daily  ? calcium carbonate  500 mg Oral Daily  ? carbamazepine  600 mg Oral BID  ? cholecalciferol  2,000 Units Oral Daily  ? diphenhydrAMINE  25 mg Oral QHS  ? DULoxetine  60 mg Oral BID  ? ethosuximide  500 mg Oral BID  ? ezetimibe  10 mg Oral Daily  ? isosorbide mononitrate  15 mg Oral Daily  ? levothyroxine  50 mcg Oral Q0600  ? memantine  10 mg Oral BID  ? pantoprazole (PROTONIX) IV  40 mg Intravenous Q12H  ? prasugrel  10 mg Oral Daily  ? sodium chloride flush  3 mL Intravenous Q12H  ? ?Continuous Infusions: ? sodium chloride    ? ?PRN Meds: ?sodium chloride, acetaminophen, azelastine, fluticasone, nitroGLYCERIN, ondansetron (ZOFRAN) IV, sodium chloride flush, zolpidem  ? ?Vital Signs  ?  ?Vitals:  ? 11/13/21 1838 11/13/21 2104 11/14/21 0537 11/14/21 0841  ?BP: (!) 122/55 130/87    ?Pulse: 86 88 83 80  ?Resp: '19 20 19 20  '$ ?Temp: (!) 97.5 ?F (36.4 ?C) 98.2 ?F (36.8 ?C) 98.4 ?F (36.9 ?C) 97.9 ?F (36.6 ?C)  ?TempSrc: Oral Oral Oral Oral  ?SpO2: 97% 100% 98% 98%  ?Weight:      ?Height:      ? ? ?Intake/Output Summary (Last 24 hours) at 11/14/2021 0845 ?Last data filed at 11/13/2021 1830 ?Gross per 24 hour  ?Intake 1139 ml  ?Output --  ?Net 1139 ml  ? ? ?  11/13/2021  ?  1:11 PM 11/11/2021  ? 12:47 AM 11/11/2021  ? 12:00 AM  ?Last 3 Weights  ?Weight (lbs) 176 lb 4.8 oz 176 lb 4.8 oz 194 lb 3.6 oz  ?Weight (kg) 79.969 kg 79.969 kg 88.1 kg  ?   ? ?Telemetry  ?  ?SR with freq PVCs, trigeminy - Personally Reviewed ? ?ECG  ?  ?No new tracing this morning.  ? ?Physical Exam  ? ?GEN: No acute distress.   ?Neck: No JVD ?Cardiac: RRR, no murmurs, rubs, or gallops.  ?Respiratory: Clear to auscultation bilaterally. ?GI: Soft, nontender, non-distended   ?MS: No edema; No deformity. Right femoral cath site stable ?Neuro:  Nonfocal  ?Psych: Normal affect  ? ?Labs  ?  ?High Sensitivity Troponin:   ?Recent Labs  ?Lab 11/09/21 ?1947 11/10/21 ?0321 11/10/21 ?0500 11/10/21 ?1843 11/10/21 ?2050  ?TROPONINIHS 41* 38* 31* 27* 259*  ?   ?Chemistry ?Recent Labs  ?Lab 11/11/21 ?0826 11/12/21 ?0351 11/14/21 ?0207  ?NA 132* 132* 133*  ?K 4.3 4.1 3.7  ?CL 101 102 103  ?CO2 '25 25 24  '$ ?GLUCOSE 115* 106* 128*  ?BUN '17 12 16  '$ ?CREATININE 0.74 0.74 0.78  ?CALCIUM 8.3* 8.4* 8.1*  ?GFRNONAA >60 >60 >60  ?ANIONGAP '6 5 6  '$ ?  ?Lipids No results for input(s): CHOL, TRIG, HDL, LABVLDL, LDLCALC, CHOLHDL in the last 168 hours.  ?Hematology ?Recent Labs  ?Lab 11/12/21 ?0351 11/12/21 ?0945 11/13/21 ?1017 11/13/21 ?2054 11/14/21 ?0207  ?WBC 8.5  --  7.5  --  8.2  ?RBC 2.88* 2.96* 2.52*  --  3.36*  ?HGB 8.3*  --  7.3* 10.3* 10.2*  ?HCT 24.9*  --  21.5* 29.6* 29.1*  ?MCV 86.5  --  85.3  --  86.6  ?MCH 28.8  --  29.0  --  30.4  ?MCHC 33.3  --  34.0  --  35.1  ?RDW 13.7  --  14.0  --  14.4  ?PLT 258  --  237  --  236  ? ?Thyroid  ?Recent Labs  ?Lab 11/10/21 ?0321  ?TSH 1.782  ?  ?BNPNo results for input(s): BNP, PROBNP in the last 168 hours.  ?DDimer No results for input(s): DDIMER in the last 168 hours.  ? ?Radiology  ?  ?ECHOCARDIOGRAM COMPLETE ? ?Result Date: 11/12/2021 ?   ECHOCARDIOGRAM REPORT   Patient Name:   BAYLOR TEEGARDEN Date of Exam: 11/12/2021 Medical Rec #:  [id number]         Height:       67.0 in Accession #:    [id number]        Weight:       176.3 lb Date of Birth:  [birth year]        BSA:          1.917 m? Patient Age:    71 years          BP:           114/61 mmHg Patient Gender: F                 HR:           98 bpm. Exam Location:  Inpatient Procedure: 2D Echo, Cardiac Doppler and Color Doppler Indications:    NSTEMI  History:        Patient has prior history of Echocardiogram examinations, most                 recent 07/30/2020. CAD, Pacemaker; Risk Factors:Hypertension,                  Dyslipidemia and Former Smoker. Sick sinus sydrome.  Sonographer:    Clayton Lefort RDCS (AE) Referring Phys: [id number] FAN YE IMPRESSIONS  1. Left ventricular ejection fraction, by estimation, is 60 to 65%. The left ventricle has normal function. The left ventricle has no regional wall motion abnormalities. There is mild left ventricular hypertrophy. Left ventricular diastolic parameters are indeterminate.  2. Right ventricular systolic function is normal. The right ventricular size is normal. Tricuspid regurgitation signal is inadequate for assessing PA pressure.  3. The mitral valve is abnormal. Mild mitral valve regurgitation. No evidence of mitral stenosis.  4. The aortic valve is tricuspid. Aortic valve regurgitation is not visualized. No aortic stenosis is present.  5. The inferior vena cava is normal in size with greater than 50% respiratory variability, suggesting right atrial pressure of 3 mmHg. FINDINGS  Left Ventricle: Left ventricular ejection fraction, by estimation, is 60 to 65%. The left ventricle has normal function. The left ventricle has no regional wall motion abnormalities. The left ventricular internal cavity size was normal in size. There is  mild left ventricular hypertrophy. Left ventricular diastolic parameters are indeterminate. Right Ventricle: The right ventricular size is normal. Right vetricular wall thickness was not well visualized. Right ventricular systolic function is normal. Tricuspid regurgitation signal is inadequate for assessing PA pressure. Left Atrium: Left atrial size was normal in size. Right Atrium: Right atrial size was normal in size. Pericardium: There is no evidence of pericardial effusion. Mitral Valve: The mitral valve is abnormal. Mild mitral valve regurgitation. No evidence of mitral valve stenosis. Tricuspid Valve: The tricuspid valve is normal in structure. Tricuspid valve regurgitation is  not demonstrated. No evidence of tricuspid stenosis. Aortic Valve: The  aortic valve is tricuspid. Aortic valve regurgitation is not visualized. No aortic stenosis is present. Aortic valve mean gradient measures 4.0 mmHg. Aortic valve peak gradient measures 6.8 mmHg. Aortic valve area, by VTI measures 2.72 cm?. Pulmonic Valve: The pulmonic valve was not well visualized. Pulmonic valve regurgitation is not visualized. No evidence of pulmonic stenosis. Aorta: The aortic root is normal in size and structure. Venous: The inferior vena cava is normal in size with greater than 50% respiratory variability, suggesting right atrial pressure of 3 mmHg. IAS/Shunts: No atrial level shunt detected by color flow Doppler.  LEFT VENTRICLE PLAX 2D LVIDd:         4.40 cm   Diastology LVIDs:         3.20 cm   LV e' medial:    6.64 cm/s LV PW:         1.00 cm   LV E/e' medial:  11.6 LV IVS:        1.30 cm   LV e' lateral:   12.60 cm/s LVOT diam:     2.10 cm   LV E/e' lateral: 6.1 LV SV:         70 LV SV Index:   36 LVOT Area:     3.46 cm?  RIGHT VENTRICLE             IVC RV Basal diam:  2.70 cm     IVC diam: 0.80 cm RV S prime:     11.70 cm/s TAPSE (M-mode): 1.7 cm LEFT ATRIUM             Index        RIGHT ATRIUM          Index LA diam:        2.80 cm 1.46 cm/m?   RA Area:     8.90 cm? LA Vol (A2C):   56.8 ml 29.63 ml/m?  RA Volume:   15.00 ml 7.83 ml/m? LA Vol (A4C):   41.8 ml 21.81 ml/m? LA Biplane Vol: 49.5 ml 25.83 ml/m?  AORTIC VALVE AV Area (Vmax):    2.60 cm? AV Area (Vmean):   2.40 cm? AV Area (VTI):     2.72 cm? AV Vmax:           130.00 cm/s AV Vmean:          95.200 cm/s AV VTI:            0.256 m AV Peak Grad:      6.8 mmHg AV Mean Grad:      4.0 mmHg LVOT Vmax:         97.70 cm/s LVOT Vmean:        66.000 cm/s LVOT VTI:          0.201 m LVOT/AV VTI ratio: 0.79  AORTA Ao Root diam: 3.10 cm Ao Asc diam:  3.00 cm MITRAL VALVE MV Area (PHT): 3.27 cm?    SHUNTS MV Decel Time: 232 msec    Systemic VTI:  0.20 m MV E velocity: 77.10 cm/s  Systemic Diam: 2.10 cm MV A velocity: 79.50 cm/s MV E/A ratio:   0.97 Carlyle Dolly MD Electronically signed by Carlyle Dolly MD Signature Date/Time: 11/12/2021/11:46:32 AM    Final    ? ?Cardiac Studies  ? ?Cath: 11/11/21 ? ?Ost LAD to Prox LAD lesion is 60% s

## 2021-11-14 NOTE — Discharge Summary (Addendum)
?Discharge Summary  ?  ?Patient ID: Rachel Clark ?MRN: [id number]; DOB: January 26, 1951 ? ?Admit date: 11/10/2021 ?Discharge date: 11/14/2021 ? ?PCP:  Harlan Stains, MD ?  ?[facility] Providers ?Cardiologist:  Siah Kannan [name], MD    ? ?Discharge Diagnoses  ?  ?Principal Problem: ?  Non-ST elevation (NSTEMI) myocardial infarction [facility]) ?Active Problems: ?  Coronary artery disease, post CABG 2007  ?  Hypothyroid ?  GERD (gastroesophageal reflux disease) ?  Iron deficiency anemia due to chronic blood loss ?  Cardiac pacemaker in situ ?  Memory change ?  Presence of stent in left circumflex coronary artery ?  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  NSTEMI (non-ST elevated myocardial infarction) ([place]) ? ? ? ?Diagnostic Studies/Procedures  ?  ?Cath: 11/11/21 ?  ?Ost LAD to Prox LAD lesion is 60% stenosed. Prox LAD lesion is 100% stenosed. ?  LIMA-graft was visualized by angiography and is normal in caliber.  The graft exhibits no disease. ?  SVG-dRCA: Origin lesion is 100% stenosed.  graft was not visualized due to known occlusion. ?  Ost RCA to Mid RCA overlapping full metal jacket DES stents, widely patent. ?  Ost 1st Diag to 1st Diag lesion is 85% stenosed. ?  ---------------------- ?  Lesion segment #2 (all in-stent restenosis) Ost Ramus to Ramus lesion is 75% stenosed.  Ramus-2 lesion is 85% stenosed.  Ramus-1 lesion is 35% stenosed. ?  Scoring balloon angioplasty was performed using a BALLN SCOREFLEX 2.50X15 -> followed by post dilation with a Mitchell Balloon 3.0 mm x 15 mm ?  Post intervention, there is a 20% residual stenosis  in the proximal portion of stent segment.  Post intervention, there is a 0% residual stenosis in the distal portion of the stent. ?  CULPRIT LESION #1: Ramus-3 lesion is 95% stenosed.-2 tandem locations of calcified stenosis. ?  Scoring balloon angioplasty was performed using a BALLN SCOREFLEX 2.50X15. ?  A drug-eluting stent was successfully placed (overlapping the distal stent) using a  STENT ONYX FRONTIER 2.5X30 -> tapered postdilation from overlap 3.1 mm down to 2.8 mm distally. ?  Post intervention, there is a 0% residual stenosis. ?  ------------------------------------------------------------------------------ ?  LV end diastolic pressure is normal. ?  There is no aortic valve stenosis. ?  ?SUMMARY ?Severe Native vessel CAD: ?stable severe proximal to mid LAD disease with LAD occlusion and patent LIMA-LAD,  ?widely patent full metal jacket stented RCA,  ?diffuse severe in-stent restenosis throughout the proximal and midportion of the stented segment of the native LCx with severe eccentric calcified lesions distal to the stent. ?Difficult/complex but successful scoring balloon angioplasty with shockwave lithotripsy followed by distal overlapping DES stent placement (Onyx Frontier DES 2.5 x 30 tapered postdilation from 3.1 in the overlap segment to 2.8 distally) ?Scoreflex angioplasty followed by post dilation with 3.0 mm Jenison balloon throughout the entire stented segment of the LCx reducing stenosis in the midportion to 10% and more proximally to 20%. ?Normal LVEDP ?  ?RECOMMENDATION ?Monitor overnight, anticipate discharge in the morning. ?Aggressive risk factor modification ?Essentially lifelong finding.  In coverage with DAPT x1 year ?  ?Glenetta Hew, MD ?  ?Diagnostic ?Dominance: Right ?Intervention ?  ?  ?Echo: 11/12/21 ?  ?IMPRESSIONS  ? ? ? 1. Left ventricular ejection fraction, by estimation, is 60 to 65%. The  ?left ventricle has normal function. The left ventricle has no regional  ?wall motion abnormalities. There is mild left ventricular hypertrophy.  ?Left ventricular diastolic parameters  ?are  indeterminate.  ? 2. Right ventricular systolic function is normal. The right ventricular  ?size is normal. Tricuspid regurgitation signal is inadequate for assessing  ?PA pressure.  ? 3. The mitral valve is abnormal. Mild mitral valve regurgitation. No  ?evidence of mitral stenosis.  ? 4. The  aortic valve is tricuspid. Aortic valve regurgitation is not  ?visualized. No aortic stenosis is present.  ? 5. The inferior vena cava is normal in size with greater than 50%  ?respiratory variability, suggesting right atrial pressure of 3 mmHg.  ? ?FINDINGS  ? Left Ventricle: Left ventricular ejection fraction, by estimation, is 60  ?to 65%. The left ventricle has normal function. The left ventricle has no  ?regional wall motion abnormalities. The left ventricular internal cavity  ?size was normal in size. There is  ? mild left ventricular hypertrophy. Left ventricular diastolic parameters  ?are indeterminate.  ? ?Right Ventricle: The right ventricular size is normal. Right vetricular  ?wall thickness was not well visualized. Right ventricular systolic  ?function is normal. Tricuspid regurgitation signal is inadequate for  ?assessing PA pressure.  ? ?Left Atrium: Left atrial size was normal in size.  ? ?Right Atrium: Right atrial size was normal in size.  ? ?Pericardium: There is no evidence of pericardial effusion.  ? ?Mitral Valve: The mitral valve is abnormal. Mild mitral valve  ?regurgitation. No evidence of mitral valve stenosis.  ? ?Tricuspid Valve: The tricuspid valve is normal in structure. Tricuspid  ?valve regurgitation is not demonstrated. No evidence of tricuspid  ?stenosis.  ? ?Aortic Valve: The aortic valve is tricuspid. Aortic valve regurgitation is  ?not visualized. No aortic stenosis is present. Aortic valve mean gradient  ?measures 4.0 mmHg. Aortic valve peak gradient measures 6.8 mmHg. Aortic  ?valve area, by VTI measures 2.72  ?cm?.  ? ?Pulmonic Valve: The pulmonic valve was not well visualized. Pulmonic valve  ?regurgitation is not visualized. No evidence of pulmonic stenosis.  ? ?Aorta: The aortic root is normal in size and structure.  ? ?Venous: The inferior vena cava is normal in size with greater than 50%  ?respiratory variability, suggesting right atrial pressure of 3 mmHg.  ? ?IAS/Shunts:  No atrial level shunt detected by color flow Doppler.  ?_____________ ?  ?History of Present Illness   ?  ?Rachel Clark is a 71 y.o. female with CAD (remote NSTEMI s/p PCI to RCA, s/p CABG 2007, subsequent failure of SVG-RCA by cath in 2016, DESx3 - full metal jacket- to RCA in 11/2015, NSTEMI 03/2021 with DESx3 to ramus intermedius), CHB/syncope s/p PPM in 03/2015, recurrent syncope/dizziness then felt due to orthostasis, GIB 03/2021 s/p transfusion with gastric antrum vessel s/p epi/clipping, HL with statin intolerance, colon CA s/p resection and chemotherapy, seizures, memory loss, chronic hyponatremia, fibromyalgia, GERD, hypothyroidism, former smoker who is being seen 11/09/2021 for the evaluation of chest pain. ?  ?Ms. [place] Gates with above complex PMH . It has been felt she requires permissive BP and salt liberalization due to h/o orthostasis and syncope. She was placed on Effient due to interaction of Brilinta with Tegretol and history of rash on Plavix. Indefinite DAPT was recommended due to extensive stents. A week after her PCI she had been readmitted with acute upper GI bleed with coffee ground emesis. Hgb dropped to 7.7 then to 6.5. she was transfused multiple units of PRBCs. DAPT was continued given recent stents. EGD showed a visible vessel in the gastric antrum treated with epinephrine injection and clipping. Treated with PPI. Bleeding scan  was subsequently negative. Hgb improved to 9.6 at discharge. She was recently referred to lipid clinic for LDL 124 despite Nexlizet -> paperwork started for Repatha. She was admitted 5/10-5/11 with chest pain with low level troponins with recommendations for medical management. Placed on Imdur with no recurrent angina and blood pressures tolerated. Discharged home 5/11 but returned to the ED with chest pain later that evening. Troponin was elevated in the 200s and admitted with plans for cardiac cath.  ? ?Hospital Course  ?   ?Consultants: GI   ? ?NSTEMI: hsTn 259,  underwent cardiac cath noted above with balloon angioplasty/shockwave lithotripsy to the ostium/mid vessel along with distal overlapping stent to ramus of prior stent. Recommendations for DAPT with AS

## 2021-11-14 NOTE — Progress Notes (Signed)
CARDIAC REHAB PHASE I  ? ?PRE:  Rate/Rhythm: 83 SR ? ?  BP: sitting 136/64 ? ?  SaO2:  ? ?MODE:  Ambulation: 380 ft  ? ?POST:  Rate/Rhythm: 110 ST ? ?  BP: sitting 155/76  ? ?  SaO2:  ? ?Pt feeling well today. Able to ambulate with  contact guard. Denied CP or SOB or dizziness. Pt HR appropriate today. No orthostasis. Pt did fatigue with distance "ready to sit down". Reviewed ed with pt and husband. Eager to d/c.  ?[id number] ? ?Yves Dill CES, ACSM ?11/14/2021 ?10:26 AM ? ? ? ? ?

## 2021-11-15 NOTE — Telephone Encounter (Signed)
Called patient's husband left message on personal voice mail to call back. ?

## 2021-11-18 NOTE — Telephone Encounter (Signed)
Husband never returned call.

## 2021-11-18 NOTE — Telephone Encounter (Signed)
Called patient's husband left message on personal voice mail to call back.

## 2021-11-22 ENCOUNTER — Telehealth (HOSPITAL_COMMUNITY): Payer: Self-pay

## 2021-11-22 NOTE — Telephone Encounter (Signed)
Called patient to see if she is interested in the Cardiac Rehab Program. Patient expressed interest. Explained scheduling process and went over insurance, patient verbalized understanding. Will contact patient for scheduling once f/u has been completed. 

## 2021-11-22 NOTE — Telephone Encounter (Signed)
Pt insurance is active and benefits verified through Medicare a/b Co-pay 0, DED $226/$226 met, out of pocket 0/0 met, co-insurance 20%. no pre-authorization required. Passport, 11/22/2021'@3' :52pm, REF# [id number]   2ndary insurance is active and benefits verified through [place]. Co-pay 0, DED 0/0 met, out of pocket 0/0 met, co-insurance 0%. No pre-authorization required. Passport, 11/22/2021'@4' :07pm, REF# [id number]   Will contact patient to see if she is interested in the Cardiac Rehab Program. If interested, patient will need to complete follow up appt. Once completed, patient will be contacted for scheduling upon review by the RN Navigator.

## 2021-11-23 ENCOUNTER — Other Ambulatory Visit: Payer: Self-pay

## 2021-11-23 ENCOUNTER — Telehealth: Payer: Self-pay | Admitting: Cardiology

## 2021-11-23 MED ORDER — NITROGLYCERIN 0.4 MG SL SUBL
0.4000 mg | SUBLINGUAL_TABLET | SUBLINGUAL | 11 refills | Status: DC | PRN
Start: 2021-11-23 — End: 2023-01-24

## 2021-11-23 NOTE — Telephone Encounter (Signed)
Called patient regarding medication refill. Left detailed message Prescription sent to pharmacy.

## 2021-11-23 NOTE — Telephone Encounter (Signed)
*  STAT* If patient is at the pharmacy, call can be transferred to refill team.   1. Which medications need to be refilled? (please list name of each medication and dose if known) nitroGLYCERIN (NITROSTAT) 0.4 MG SL tablet  2. Which pharmacy/location (including street and city if local pharmacy) is medication to be sent to? [facility] # [place], [place]  3. Do they need a 30 day or 90 day supply? 30 day

## 2021-12-02 ENCOUNTER — Ambulatory Visit: Payer: Medicare Other | Admitting: Physician Assistant

## 2021-12-05 NOTE — Progress Notes (Unsigned)
Cardiology Office Note:    Date:  12/06/2021   ID:  Rachel Clark, DOB 04-26-1951, MRN [id number]  PCP:  Harlan Stains, MD [place] Cardiologist: Peter [name], MD   Reason for visit: Hospital follow-up  History of Present Illness:    Rachel Clark is a 71 y.o. female with a hx of CAD (remote NSTEMI s/p PCI to RCA, s/p CABG 2007, subsequent failure of SVG-RCA by cath in 2016, DESx3 - full metal jacket- to RCA in 11/2015, NSTEMI 03/2021 with DESx3 to ramus intermedius), CHB/syncope s/p PPM in 03/2015, recurrent syncope/dizziness then felt due to orthostasis, GIB 03/2021 s/p transfusion with gastric antrum vessel s/p epi/clipping, HL with statin intolerance, colon CA s/p resection and chemotherapy, seizures, memory loss, chronic hyponatremia, fibromyalgia, GERD, hypothyroidism, former smoker.  She last saw Dr. [name] in October 20, 2021 without recurrent bleeding.  She did have occasional orthostatic symptoms with lightheadedness and weakness.  She was admitted to [facility] in May 2023 with chest pain.  With low-level troponins she was initially treated medically and started on Imdur.  She returned to the ED later that evening with troponin rise in the 200s.  She underwent LHC with Difficult/complex but successful scoring balloon angioplasty with shockwave lithotripsy followed by distal overlapping DES stent placement to Lcx.  With hemoglobin drop to 7.3, she received 2 units of pRBCs.   She underwent EGD showing small prepyloric gastric ulcer with no evidence of  active bleeding, some gastritis. Recommendations for IV protonix BID while inpatient, then switch to protonix '40mg'$  BID for 4 weeks at discharge, then protonix '40mg'$  daily indefinitely.  Today, she feels well other than chronic fatigue.  The husband speaks a lot for her secondary to patient's cognitive impairment.  Chronic fatigue secondary to medical conditions and sedentary lifestyle -patient/husband nervous to do much secondary  to orthostatic hypotension.  Since her hospital discharge, she denies chest pain or shortness of breath.  Tolerating medications without issues.  No bleeding on aspirin and Effient.  Chronic lightheadedness unchanged -not as severe as it has been in the past when she was concerned of passing out.  Patient wears compression stockings, stays hydrated and liberalize his salt intake.  Taking Repatha without issues.  Her husband states that she is only had GI bleeding issues when she has MIs and is put on IV heparin.  No chronic bleeding concerns.     Last LHC 10/2021:  *stable severe proximal to mid LAD disease with LAD occlusion and patent LIMA-LAD,  *widely patent full metal jacket stented RCA,  *diffuse severe in-stent restenosis throughout the proximal and midportion of the stented segment of the native LCx with severe eccentric calcified lesions distal to the stent. *Difficult/complex but successful scoring balloon angioplasty with shockwave lithotripsy followed by distal overlapping DES stent placement    Past Medical History:  Diagnosis Date   Arthritis    "fingers" (06/29/2016)   Chronic lower back pain    Colon cancer ([place]) 12/04/2011   s/p Laparoscopic-assisted transverse colectomy on 12/19/2011 by Dr. Donne Hazel.  pT3 N0 M0.    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, controlled ([place]) 12/06/2011   last seizure was in 1972 ;takes Tegretol (06/29/2016)    Headache    "weekly" (06/29/2016)   Hyperlipidemia  Hyponatremia    Hypothyroid    Iron deficiency anemia 11/17/2011   Memory change 11/13/2016   Monoclonal gammopathy of unknown significance    Orthostatic hypotension    Presence of permanent cardiac pacemaker    Syncope and collapse    pacemaker implanted    Past Surgical History:  Procedure Laterality Date    ANTERIOR CERVICAL DECOMP/DISCECTOMY FUSION  1980's   BACK SURGERY     CARDIAC CATHETERIZATION  05/20/2009   obstructive native vessel disease in LAD, RCA, and first diagonal, patent vein graft to distal RCA and LIMA to LAD,normal. ef 60%   CARDIAC CATHETERIZATION N/A 03/24/2015   Procedure: Left Heart Cath and Cors/Grafts Angiography;  Surgeon: Wellington Hampshire, MD;  Location: [place] [facility];  Service: Cardiovascular;  Laterality: N/A;   CARDIAC CATHETERIZATION N/A 07/28/2015   Procedure: Left Heart Cath and Coronary Angiography;  Surgeon: Troy Sine, MD;  Location: [place] [facility];  Service: Cardiovascular;  Laterality: N/A;   CARDIAC CATHETERIZATION N/A 11/09/2015   Procedure: Coronary Stent Intervention;  Surgeon: Sherren Mocha, MD;  Location: [place] [facility];  Service: Cardiovascular;  Laterality: N/A;   CARDIAC CATHETERIZATION N/A 11/09/2015   Procedure: Left Heart Cath and Coronary Angiography;  Surgeon: Sherren Mocha, MD;  Location: [place] [facility];  Service: Cardiovascular;  Laterality: N/A;   CARDIAC CATHETERIZATION N/A 06/29/2016   Procedure: Left Heart Cath and Coronary Angiography;  Surgeon: Nelva Bush, MD;  Location: [place] [facility];  Service: Cardiovascular;  Laterality: N/A;   CARDIAC CATHETERIZATION N/A 06/29/2016   Procedure: Intravascular Pressure Wire/FFR Study;  Surgeon: Nelva Bush, MD;  Location: [place] [facility];  Service: Cardiovascular;  Laterality: N/A;   CARDIAC CATHETERIZATION N/A 06/29/2016   Procedure: Coronary Balloon Angioplasty;  Surgeon: Nelva Bush, MD;  Location: [place] [facility];  Service: Cardiovascular;  Laterality: N/A;   COLON RESECTION  12/19/2011   Procedure: COLON RESECTION LAPAROSCOPIC;  Surgeon: Rolm Bookbinder, MD;  Location: [place];  Service: General;  Laterality: N/A;  laparoscopic hand assisted partial colon resection   COLON SURGERY     COLONOSCOPY     CORONARY ANGIOPLASTY WITH STENT PLACEMENT  ~ 2007   1 stent    CORONARY ARTERY BYPASS GRAFT  2007   "CABG X2"   DILATION AND CURETTAGE OF UTERUS  1973   EP IMPLANTABLE DEVICE N/A 03/25/2015   MDT Advisa DR pacemaker implanted by Dr Rayann Heman for transient complete heart block and syncope   ESOPHAGOGASTRODUODENOSCOPY (EGD) WITH PROPOFOL N/A 03/14/2021   Procedure: ESOPHAGOGASTRODUODENOSCOPY (EGD) WITH PROPOFOL;  Surgeon: Ronnette Juniper, MD;  Location: [place];  Service: Gastroenterology;  Laterality: N/A;   ESOPHAGOGASTRODUODENOSCOPY (EGD) WITH PROPOFOL N/A 11/13/2021   Procedure: ESOPHAGOGASTRODUODENOSCOPY (EGD) WITH PROPOFOL;  Surgeon: Otis Brace, MD;  Location: [place];  Service: Gastroenterology;  Laterality: N/A;   HEMOSTASIS CLIP PLACEMENT  03/14/2021   Procedure: HEMOSTASIS CLIP PLACEMENT;  Surgeon: Ronnette Juniper, MD;  Location: [facility];  Service: Gastroenterology;;   INSERT / REPLACE / [place] CATH AND CORONARY ANGIOGRAPHY N/A 11/11/2021   Procedure: LEFT HEART CATH AND CORONARY ANGIOGRAPHY;  Surgeon: Leonie Man, MD;  Location: [place] [facility];  Service: Cardiovascular;  Laterality: N/A;   LEFT HEART CATH AND CORS/GRAFTS ANGIOGRAPHY N/A 02/21/2018   Procedure: LEFT HEART CATH AND CORS/GRAFTS ANGIOGRAPHY;  Surgeon: Lorretta Harp, MD;  Location: [place] [facility];  Service: Cardiovascular;  Laterality: N/A;   LEFT HEART CATH  AND CORS/GRAFTS ANGIOGRAPHY N/A 03/08/2021   Procedure: LEFT HEART CATH AND CORS/GRAFTS ANGIOGRAPHY;  Surgeon: [name], Peter M, MD;  Location: [place] [facility];  Service: Cardiovascular;  Laterality: N/A;   PORT-A-CATH REMOVAL N/A 06/12/2013   Procedure: REMOVAL PORT-A-CATH;  Surgeon: Rolm Bookbinder, MD;  Location: [place];  Service: General;  Laterality: N/A;   PORTACATH PLACEMENT  06/04/2012   Procedure: INSERTION PORT-A-CATH;  Surgeon: Rolm Bookbinder, MD;  Location: [place];  Service: General;  Laterality: N/A;  Insertion of port-a-cath    POSTERIOR  LAMINECTOMY / [place]  03/14/2021   Procedure: SCLEROTHERAPY;  Surgeon: Ronnette Juniper, MD;  Location: [facility] ENDOSCOPY;  Service: Gastroenterology;;   VAGINAL HYSTERECTOMY      Current Medications: Current Meds  Medication Sig   acetaminophen (TYLENOL) 500 MG tablet Take 500-1,000 mg by mouth daily as needed for mild pain or headache.   aspirin 81 MG chewable tablet Chew 1 tablet (81 mg total) by mouth daily.   Azelastine HCl 0.15 % SOLN Place 1 spray into both nostrils daily as needed (seasonal allergies).   Bempedoic Acid-Ezetimibe (NEXLIZET) 180-10 MG TABS Take 180 mg by mouth daily.   calcium carbonate (TUMS - DOSED IN MG ELEMENTAL CALCIUM) 500 MG chewable tablet Chew 500 mg by mouth daily.   carbamazepine (TEGRETOL XR) 200 MG 12 hr tablet Take 3 tablets (600 mg total) by mouth 2 (two) times daily.   Cholecalciferol (VITAMIN D) 50 MCG (2000 UT) CAPS Take 2,000 Units by mouth daily.   diphenhydrAMINE HCl (BENADRYL ALLERGY PO) Take 1 tablet by mouth daily as needed (allergy).   diphenhydramine-acetaminophen (TYLENOL PM) 25-500 MG TABS tablet Take 1 tablet by mouth at bedtime.   DULoxetine (CYMBALTA) 60 MG capsule Take 60 mg by mouth 2 (two) times daily.   ethosuximide (ZARONTIN) 250 MG capsule Take 2 capsules (500 mg total) by mouth 2 (two) times daily.   Evolocumab with Infusor ([place]) 420 MG/3.5ML SOCT Inject 420 mg into the skin every 30 (thirty) days.   Fluticasone Propionate (FLONASE ALLERGY RELIEF NA) Place 1 spray into the nose daily as needed (allergy relief).   isosorbide mononitrate (IMDUR) 30 MG 24 hr tablet Take 0.5 tablets (15 mg total) by mouth daily.   levothyroxine (SYNTHROID, LEVOTHROID) 50 MCG tablet Take 50 mcg by mouth daily before breakfast.   memantine (NAMENDA) 10 MG tablet Take 1 tablet (10 mg total) by mouth 2 (two) times daily.   nitroGLYCERIN (NITROSTAT) 0.4 MG SL tablet Place 1 tablet (0.4 mg total) under  the tongue every 5 (five) minutes as needed. For chest pain.   pantoprazole (PROTONIX) 40 MG tablet Take 40 mg by mouth daily.   prasugrel (EFFIENT) 10 MG TABS tablet Take 1 tablet (10 mg total) by mouth daily.     Allergies:   Sulfamethoxazole, Aspirin, Crestor [rosuvastatin], Erythromycin, Lisinopril, Niacin and related, Penicillin g benzathine, Statins, Sulfa drugs cross reactors, Tetracyclines & related, Xeloda [capecitabine], Penicillins, and Plavix [clopidogrel bisulfate]   Social History   Socioeconomic History   Marital status: Married    Spouse name: Not on file   Number of children: 2   Years of education: 12   Highest education level: Not on file  Occupational History   Occupation: Retired  Tobacco Use   Smoking status: Former    Packs/day: 0.30    Years: 20.00    Pack years: 6.00    Types: Cigarettes    Quit date:  06/06/1981    Years since quitting: 40.5   Smokeless tobacco: Never  Vaping Use   Vaping Use: Never used  Substance and Sexual Activity   Alcohol use: Yes    Alcohol/week: 1.0 standard drink    Types: 1 Glasses of wine per week    Comment: occ   Drug use: No   Sexual activity: Not Currently    Birth control/protection: Surgical  Other Topics Concern   Not on file  Social History Narrative   Patient is married with 2 children.   Patient is right handed.   Patient has a high school education with some college education.   Patient drinks 2 cups daily.   Social Determinants of Health   Financial Resource Strain: Not on file  Food Insecurity: Not on file  Transportation Needs: Not on file  Physical Activity: Not on file  Stress: Not on file  Social Connections: Not on file     Family History: The patient's family history includes Cancer in her mother; Heart attack in her father; Heart attack (age of onset: 110) in her brother; Heart disease in her father. There is no history of Stroke.  ROS:   Please see the history of present illness.      EKGs/Labs/Other Studies Reviewed:    EKG:  The ekg ordered today demonstrates normal sinus rhythm with 1 PVC, heart rate 95, QRS duration 76 ms.  Recent Labs: 10/19/2021: ALT 8 11/10/2021: TSH 1.782 11/14/2021: BUN 16; Creatinine, Ser 0.78; Hemoglobin 10.2; Magnesium 2.0; Platelets 236; Potassium 3.7; Sodium 133   Recent Lipid Panel Lab Results  Component Value Date/Time   CHOL 236 (H) 10/19/2021 09:41 AM   TRIG 135 10/19/2021 09:41 AM   HDL 87 10/19/2021 09:41 AM   LDLCALC 126 (H) 10/19/2021 09:41 AM    Physical Exam:    VS:  BP 132/76   Pulse 95   Ht '5\' 9"'$  (1.753 m)   Wt 176 lb 6.4 oz (80 kg)   SpO2 98%   BMI 26.05 kg/m    No data found.  Wt Readings from Last 3 Encounters:  12/06/21 176 lb 6.4 oz (80 kg)  11/13/21 176 lb 4.8 oz (80 kg)  11/09/21 194 lb 3.6 oz (88.1 kg)     GEN: Well nourished, well developed in no acute distress HEENT: Normal NECK: No JVD; No carotid bruits CARDIAC: RRR, no murmurs, rubs, gallops RESPIRATORY:  Clear to auscultation without rales, wheezing or rhonchi  ABDOMEN: Soft, non-tender, non-distended MUSCULOSKELETAL: No edema; No deformity  SKIN: Warm and dry NEUROLOGIC:  Alert and oriented PSYCHIATRIC:  Normal affect     ASSESSMENT AND PLAN   Coronary artery disease with no angina - s/p CABG. S/p extensive stenting of RCA.  in 2017. Recent small NSTEMI with complex stenting of a large ramus intermediate branch with DES x 3 in September 2022.  DES to Lcx in 10/2021. -Recommend indefinite DAPT - On ASA and Effient (interaction of Brilinta with Tegretol; history of rash on Plavix) -Continue lipid therapy -Continue Imdur 15 mg daily -doing well on this dose with no angina and manageable lightheadedness -Patient is cleared to start cardiac rehab -I believe this will help her stamina and fatigue.  Helpful to increase activity supervised given her history of orthostatic hypotension.  GI bleeding and acute blood loss anemia -EGD 10/2021  showed small prepyloric gastric ulcer with no evidence of  active bleeding, some gastritis. Recommendations for IV protonix BID while inpatient, then switch to protonix '40mg'$   BID for 4 weeks at discharge, then protonix '40mg'$  daily indefinitely. -Hgb 10.2 on discharge -Denies bloody stools or nosebleeds  Hyperlipidemia with LDL goal < 70 -Saw pharm-D 11/07/2021 for new start Repatha first dose mid May 2023 (LDL 126 in April 2023) -Continued on Nexlizet -History of multiple statin intolerance with myalgias -Repeat lipids in 2 months  Syncope -Secondary to orthostatic hypotension/and/or neurocardiogenic syncope. Pacer evaluation has been normal.  -Previous recommendations of salt liberalization & good hydration -Allow more permissive BP. Recommend that she not lie flat.   SSS. S/p pacemaker. Followed in device clinic.   Disposition - Follow-up in October with Dr. [name] as scheduled.   Medication Adjustments/Labs and Tests Ordered: Current medicines are reviewed at length with the patient today.  Concerns regarding medicines are outlined above.  Orders Placed This Encounter  Procedures   EKG 12-Lead   No orders of the defined types were placed in this encounter.   Patient Instructions  Medication Instructions:  Your physician recommends that you continue on your current medications as directed. Please refer to the Current Medication list given to you today.   *If you need a refill on your cardiac medications before your next appointment, please call your pharmacy*   Lab Work: NONE ordered at this time of appointment   If you have labs (blood work) drawn today and your tests are completely normal, you will receive your results only by: [place] (if you have MyChart) OR A paper copy in the mail If you have any lab test that is abnormal or we need to change your treatment, we will call you to review the results.   Testing/Procedures: NONE ordered at this time of appointment      Follow-Up: At [facility], you and your health needs are our priority.  As part of our continuing mission to provide you with exceptional heart care, we have created designated Provider Care Teams.  These Care Teams include your primary Cardiologist (physician) and Advanced Practice Providers (APPs -  Physician Assistants and Nurse Practitioners) who all work together to provide you with the care you need, when you need it.  We recommend signing up for the patient portal called "MyChart".  Sign up information is provided on this After Visit Summary.  MyChart is used to connect with patients for Virtual Visits (Telemedicine).  Patients are able to view lab/test results, encounter notes, upcoming appointments, etc.  Non-urgent messages can be sent to your provider as well.   To learn more about what you can do with MyChart, go to [email or web address].    Your next appointment:    April 25, 2022  The format for your next appointment:   In Person  Provider:   Peter [name], MD     Other Instructions   Important Information About Sugar         Signed, Gaston Islam  12/06/2021 10:52 AM    [place]

## 2021-12-06 ENCOUNTER — Encounter: Payer: Self-pay | Admitting: Physician Assistant

## 2021-12-06 ENCOUNTER — Ambulatory Visit (INDEPENDENT_AMBULATORY_CARE_PROVIDER_SITE_OTHER): Payer: Medicare Other | Admitting: Physician Assistant

## 2021-12-06 VITALS — BP 132/76 | HR 95 | Ht 69.0 in | Wt 176.4 lb

## 2021-12-06 DIAGNOSIS — I495 Sick sinus syndrome: Secondary | ICD-10-CM | POA: Diagnosis not present

## 2021-12-06 DIAGNOSIS — I2510 Atherosclerotic heart disease of native coronary artery without angina pectoris: Secondary | ICD-10-CM | POA: Diagnosis not present

## 2021-12-06 DIAGNOSIS — E785 Hyperlipidemia, unspecified: Secondary | ICD-10-CM

## 2021-12-06 DIAGNOSIS — I951 Orthostatic hypotension: Secondary | ICD-10-CM | POA: Diagnosis not present

## 2021-12-06 NOTE — Patient Instructions (Signed)
Medication Instructions:  Your physician recommends that you continue on your current medications as directed. Please refer to the Current Medication list given to you today.   *If you need a refill on your cardiac medications before your next appointment, please call your pharmacy*   Lab Work: NONE ordered at this time of appointment   If you have labs (blood work) drawn today and your tests are completely normal, you will receive your results only by: [place] (if you have MyChart) OR A paper copy in the mail If you have any lab test that is abnormal or we need to change your treatment, we will call you to review the results.   Testing/Procedures: NONE ordered at this time of appointment     Follow-Up: At [facility], you and your health needs are our priority.  As part of our continuing mission to provide you with exceptional heart care, we have created designated Provider Care Teams.  These Care Teams include your primary Cardiologist (physician) and Advanced Practice Providers (APPs -  Physician Assistants and Nurse Practitioners) who all work together to provide you with the care you need, when you need it.  We recommend signing up for the patient portal called "MyChart".  Sign up information is provided on this After Visit Summary.  MyChart is used to connect with patients for Virtual Visits (Telemedicine).  Patients are able to view lab/test results, encounter notes, upcoming appointments, etc.  Non-urgent messages can be sent to your provider as well.   To learn more about what you can do with MyChart, go to [email or web address].    Your next appointment:    April 25, 2022  The format for your next appointment:   In Person  Provider:   Peter [name], MD     Other Instructions   Important Information About Sugar

## 2021-12-27 ENCOUNTER — Other Ambulatory Visit: Payer: Self-pay

## 2021-12-30 ENCOUNTER — Telehealth: Payer: Self-pay | Admitting: Cardiology

## 2021-12-30 NOTE — Telephone Encounter (Signed)
Please refer to Doreene Adas, PA-C note from March.  Would not be eligible to hold any anticoagulation until September of this year.  Please give the patient a call and let her know this information.

## 2021-12-30 NOTE — Telephone Encounter (Signed)
Pt's husband is requesting a call back in regards to pt needing to schedule a colonoscopy and he needs the o.k. from Dr. [name] to have these procedures.

## 2022-01-06 ENCOUNTER — Other Ambulatory Visit: Payer: Self-pay

## 2022-01-06 ENCOUNTER — Inpatient Hospital Stay: Payer: Medicare Other | Attending: Hematology and Oncology

## 2022-01-06 DIAGNOSIS — Z888 Allergy status to other drugs, medicaments and biological substances status: Secondary | ICD-10-CM | POA: Diagnosis not present

## 2022-01-06 DIAGNOSIS — I2510 Atherosclerotic heart disease of native coronary artery without angina pectoris: Secondary | ICD-10-CM | POA: Diagnosis not present

## 2022-01-06 DIAGNOSIS — Z880 Allergy status to penicillin: Secondary | ICD-10-CM | POA: Diagnosis not present

## 2022-01-06 DIAGNOSIS — R252 Cramp and spasm: Secondary | ICD-10-CM | POA: Diagnosis not present

## 2022-01-06 DIAGNOSIS — Z85038 Personal history of other malignant neoplasm of large intestine: Secondary | ICD-10-CM | POA: Diagnosis not present

## 2022-01-06 DIAGNOSIS — Z7902 Long term (current) use of antithrombotics/antiplatelets: Secondary | ICD-10-CM | POA: Insufficient documentation

## 2022-01-06 DIAGNOSIS — M79604 Pain in right leg: Secondary | ICD-10-CM | POA: Diagnosis not present

## 2022-01-06 DIAGNOSIS — Z881 Allergy status to other antibiotic agents status: Secondary | ICD-10-CM | POA: Insufficient documentation

## 2022-01-06 DIAGNOSIS — Z886 Allergy status to analgesic agent status: Secondary | ICD-10-CM | POA: Diagnosis not present

## 2022-01-06 DIAGNOSIS — Z882 Allergy status to sulfonamides status: Secondary | ICD-10-CM | POA: Insufficient documentation

## 2022-01-06 DIAGNOSIS — D649 Anemia, unspecified: Secondary | ICD-10-CM | POA: Insufficient documentation

## 2022-01-06 DIAGNOSIS — D472 Monoclonal gammopathy: Secondary | ICD-10-CM | POA: Diagnosis not present

## 2022-01-06 DIAGNOSIS — Z79890 Hormone replacement therapy (postmenopausal): Secondary | ICD-10-CM | POA: Diagnosis not present

## 2022-01-06 DIAGNOSIS — Z79899 Other long term (current) drug therapy: Secondary | ICD-10-CM | POA: Diagnosis not present

## 2022-01-06 LAB — COMPREHENSIVE METABOLIC PANEL
ALT: 10 U/L (ref 0–44)
AST: 11 U/L — ABNORMAL LOW (ref 15–41)
Albumin: 4.1 g/dL (ref 3.5–5.0)
Alkaline Phosphatase: 67 U/L (ref 38–126)
Anion gap: 11 (ref 5–15)
BUN: 16 mg/dL (ref 8–23)
CO2: 25 mmol/L (ref 22–32)
Calcium: 8.8 mg/dL — ABNORMAL LOW (ref 8.9–10.3)
Chloride: 97 mmol/L — ABNORMAL LOW (ref 98–111)
Creatinine, Ser: 0.77 mg/dL (ref 0.44–1.00)
GFR, Estimated: >60 mL/min (ref >60)
Glucose, Bld: 112 mg/dL — ABNORMAL HIGH (ref 70–99)
Potassium: 3.4 mmol/L — ABNORMAL LOW (ref 3.5–5.1)
Sodium: 133 mmol/L — ABNORMAL LOW (ref 135–145)
Total Bilirubin: 0.3 mg/dL (ref 0.3–1.2)
Total Protein: 7.4 g/dL (ref 6.5–8.1)

## 2022-01-06 LAB — CBC WITH DIFFERENTIAL/PLATELET
Abs Immature Granulocytes: 0.01 10*3/uL (ref 0.00–0.07)
Basophils Absolute: 0 10*3/uL (ref 0.0–0.1)
Basophils Relative: 1 %
Eosinophils Absolute: 0.1 10*3/uL (ref 0.0–0.5)
Eosinophils Relative: 2 %
HCT: 37.4 % (ref 36.0–46.0)
Hemoglobin: 12.3 g/dL (ref 12.0–15.0)
Immature Granulocytes: 0 %
Lymphocytes Relative: 31 %
Lymphs Abs: 1.8 10*3/uL (ref 0.7–4.0)
MCH: 27.9 pg (ref 26.0–34.0)
MCHC: 32.9 g/dL (ref 30.0–36.0)
MCV: 84.8 fL (ref 80.0–100.0)
Monocytes Absolute: 0.4 10*3/uL (ref 0.1–1.0)
Monocytes Relative: 7 %
Neutro Abs: 3.5 10*3/uL (ref 1.7–7.7)
Neutrophils Relative %: 59 %
Platelets: 361 10*3/uL (ref 150–400)
RBC: 4.41 MIL/uL (ref 3.87–5.11)
RDW: 13.3 % (ref 11.5–15.5)
WBC: 6 10*3/uL (ref 4.0–10.5)
nRBC: 0 % (ref 0.0–0.2)

## 2022-01-09 ENCOUNTER — Ambulatory Visit (INDEPENDENT_AMBULATORY_CARE_PROVIDER_SITE_OTHER): Payer: Medicare Other

## 2022-01-09 DIAGNOSIS — I495 Sick sinus syndrome: Secondary | ICD-10-CM | POA: Diagnosis not present

## 2022-01-09 LAB — KAPPA/LAMBDA LIGHT CHAINS
Kappa free light chain: 39.5 mg/L — ABNORMAL HIGH (ref 3.3–19.4)
Kappa, lambda light chain ratio: 2.24 — ABNORMAL HIGH (ref 0.26–1.65)
Lambda free light chains: 17.6 mg/L (ref 5.7–26.3)

## 2022-01-10 LAB — CUP PACEART REMOTE DEVICE CHECK
Battery Remaining Longevity: 59 mo
Battery Voltage: 2.99 V
Brady Statistic AP VP Percent: 0.02 %
Brady Statistic AP VS Percent: 1.98 %
Brady Statistic AS VP Percent: 0.07 %
Brady Statistic AS VS Percent: 97.93 %
Brady Statistic RA Percent Paced: 1.98 %
Brady Statistic RV Percent Paced: 0.08 %
Date Time Interrogation Session: 20230711135622
Implantable Lead Implant Date: 20160922
Implantable Lead Implant Date: 20160922
Implantable Lead Location: 753859
Implantable Lead Location: 753860
Implantable Lead Model: 5076
Implantable Lead Model: 5076
Implantable Pulse Generator Implant Date: 20160922
Lead Channel Impedance Value: 1425 Ohm
Lead Channel Impedance Value: 1425 Ohm
Lead Channel Impedance Value: 437 Ohm
Lead Channel Impedance Value: 494 Ohm
Lead Channel Pacing Threshold Amplitude: 1 V
Lead Channel Pacing Threshold Amplitude: 1.125 V
Lead Channel Pacing Threshold Pulse Width: 0.4 ms
Lead Channel Pacing Threshold Pulse Width: 0.4 ms
Lead Channel Sensing Intrinsic Amplitude: 3.125 mV
Lead Channel Sensing Intrinsic Amplitude: 3.125 mV
Lead Channel Sensing Intrinsic Amplitude: 8.5 mV
Lead Channel Sensing Intrinsic Amplitude: 8.5 mV
Lead Channel Setting Pacing Amplitude: 2.5 V
Lead Channel Setting Pacing Amplitude: 2.5 V
Lead Channel Setting Pacing Pulse Width: 0.4 ms
Lead Channel Setting Sensing Sensitivity: 2.8 mV

## 2022-01-10 LAB — MULTIPLE MYELOMA PANEL, SERUM
Albumin SerPl Elph-Mcnc: 3.7 g/dL (ref 2.9–4.4)
Albumin/Glob SerPl: 1.2 (ref 0.7–1.7)
Alpha 1: 0.3 g/dL (ref 0.0–0.4)
Alpha2 Glob SerPl Elph-Mcnc: 0.8 g/dL (ref 0.4–1.0)
B-Globulin SerPl Elph-Mcnc: 0.9 g/dL (ref 0.7–1.3)
Gamma Glob SerPl Elph-Mcnc: 1.2 g/dL (ref 0.4–1.8)
Globulin, Total: 3.2 g/dL (ref 2.2–3.9)
IgA: 186 mg/dL (ref 64–422)
IgG (Immunoglobin G), Serum: 1233 mg/dL (ref 586–1602)
IgM (Immunoglobulin M), Srm: 107 mg/dL (ref 26–217)
M Protein SerPl Elph-Mcnc: 0.5 g/dL — ABNORMAL HIGH
Total Protein ELP: 6.9 g/dL (ref 6.0–8.5)

## 2022-01-13 ENCOUNTER — Other Ambulatory Visit: Payer: Self-pay

## 2022-01-13 ENCOUNTER — Telehealth: Payer: Self-pay

## 2022-01-13 ENCOUNTER — Inpatient Hospital Stay (HOSPITAL_BASED_OUTPATIENT_CLINIC_OR_DEPARTMENT_OTHER): Payer: Medicare Other | Admitting: Hematology and Oncology

## 2022-01-13 ENCOUNTER — Ambulatory Visit (HOSPITAL_BASED_OUTPATIENT_CLINIC_OR_DEPARTMENT_OTHER)
Admission: RE | Admit: 2022-01-13 | Discharge: 2022-01-13 | Disposition: A | Discharge status: Home / Self Care | Payer: Medicare Other | Source: Ambulatory Visit | Attending: Hematology and Oncology | Admitting: Hematology and Oncology

## 2022-01-13 ENCOUNTER — Encounter: Payer: Self-pay | Admitting: Hematology and Oncology

## 2022-01-13 DIAGNOSIS — D472 Monoclonal gammopathy: Secondary | ICD-10-CM | POA: Diagnosis not present

## 2022-01-13 DIAGNOSIS — I2510 Atherosclerotic heart disease of native coronary artery without angina pectoris: Secondary | ICD-10-CM | POA: Diagnosis not present

## 2022-01-13 DIAGNOSIS — M79604 Pain in right leg: Secondary | ICD-10-CM | POA: Insufficient documentation

## 2022-01-13 DIAGNOSIS — D649 Anemia, unspecified: Secondary | ICD-10-CM | POA: Diagnosis not present

## 2022-01-13 DIAGNOSIS — R252 Cramp and spasm: Secondary | ICD-10-CM | POA: Diagnosis not present

## 2022-01-13 NOTE — Progress Notes (Signed)
Right lower extremity venous duplex completed. Refer to "CV Proc" under chart review to view preliminary results.  01/13/2022 10:32 AM Kelby Aline., MHA, RVT, RDCS, RDMS

## 2022-01-13 NOTE — Assessment & Plan Note (Signed)
She has signs of hypocalcemia that could cause leg cramps I recommend vitamin D and calcium

## 2022-01-13 NOTE — Progress Notes (Signed)
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 Her light chains are stable She has no signs of anemia, hypercalcemia or renal failure We will continue to see her once a year  Right leg pain She has significant cramping of her legs I am concerned about risk of DVT due to recurrent hospitalization I ordered urgent ultrasound venous Doppler that came back negative for DVT I recommend follow-up with her primary care doctor for evaluation and management  Hypocalcemia She has signs of hypocalcemia that could cause leg cramps I recommend vitamin D and calcium  Orders Placed This Encounter  Procedures   CMP (Cancer Center only)    Standing Status:   Future    Standing Expiration Date:   01/14/2023   CBC with Differential (Cancer Center Only)    Standing Status:   Future    Standing Expiration Date:   01/14/2023   Kappa/lambda light chains    Standing Status:   Standing    Number of Occurrences:   22    Standing Expiration Date:   01/14/2023   Multiple Myeloma Panel (SPEP&IFE w/QIG)    Standing Status:   Standing    Number of Occurrences:   22    Standing Expiration Date:   01/14/2023    All questions were answered. The patient knows to call the clinic with any problems, questions or concerns. The total time spent in the appointment was 30 minutes encounter with patients including review of chart and various tests results, discussions about plan of care and coordination of care plan    , MD 01/13/2022 11:55 AM  INTERVAL HISTORY: Please see below for problem oriented charting. she returns for surveillance follow-up for MGUS and remote history of colon cancer She had recurrent  hospitalization over the past year due to coronary artery disease After we review test result, she disclosed significant right leg cramp She denies recent fall or trauma  REVIEW OF SYSTEMS:   Constitutional: Denies fevers, chills or abnormal weight loss Eyes: Denies blurriness of vision Ears, nose, mouth, throat, and face: Denies mucositis or sore throat Respiratory: Denies cough, dyspnea or wheezes Cardiovascular: Denies palpitation, chest discomfort or lower extremity swelling Gastrointestinal:  Denies nausea, heartburn or change in bowel habits Skin: Denies abnormal skin rashes Lymphatics: Denies new lymphadenopathy or easy bruising Neurological:Denies numbness, tingling or new weaknesses Behavioral/Psych: Mood is stable, no new changes  All other systems were reviewed with the patient and are negative.  I have reviewed the past medical history, past surgical history, social history and family history with the patient and they are unchanged from previous note.  ALLERGIES:  is allergic to sulfamethoxazole, aspirin, crestor [rosuvastatin], erythromycin, lisinopril, niacin and related, penicillin g benzathine, statins, sulfa drugs cross reactors, tetracyclines & related, xeloda [capecitabine], penicillins, and plavix [clopidogrel bisulfate].  MEDICATIONS:  Current Outpatient Medications  Medication Sig Dispense Refill   acetaminophen (TYLENOL) 500 MG tablet Take 500-1,000 mg by mouth daily as needed for mild pain or headache.     aspirin 81 MG chewable tablet Chew 1 tablet (81 mg total) by mouth daily.     Azelastine HCl 0.15 % SOLN Place 1 spray into both nostrils daily as needed (seasonal allergies).  12   Bempedoic Acid-Ezetimibe (NEXLIZET)   180-10 MG TABS Take 180 mg by mouth daily. 90 tablet 3   calcium carbonate (TUMS - DOSED IN MG ELEMENTAL CALCIUM) 500 MG chewable tablet Chew 500 mg by mouth daily.     carbamazepine (TEGRETOL XR) 200 MG 12 hr tablet Take 3 tablets (600 mg total)  by mouth 2 (two) times daily. 540 tablet 4   Cholecalciferol (VITAMIN D) 50 MCG (2000 UT) CAPS Take 2,000 Units by mouth daily.     diphenhydrAMINE HCl (BENADRYL ALLERGY PO) Take 1 tablet by mouth daily as needed (allergy).     diphenhydramine-acetaminophen (TYLENOL PM) 25-500 MG TABS tablet Take 1 tablet by mouth at bedtime.     DULoxetine (CYMBALTA) 60 MG capsule Take 60 mg by mouth 2 (two) times daily.     ethosuximide (ZARONTIN) 250 MG capsule Take 2 capsules (500 mg total) by mouth 2 (two) times daily. 360 capsule 4   Evolocumab with Infusor (REPATHA PUSHTRONEX SYSTEM) 420 MG/3.5ML SOCT Inject 420 mg into the skin every 30 (thirty) days. 3.6 mL 11   Fluticasone Propionate (FLONASE ALLERGY RELIEF NA) Place 1 spray into the nose daily as needed (allergy relief).     isosorbide mononitrate (IMDUR) 30 MG 24 hr tablet Take 0.5 tablets (15 mg total) by mouth daily. 15 tablet 3   levothyroxine (SYNTHROID, LEVOTHROID) 50 MCG tablet Take 50 mcg by mouth daily before breakfast.     memantine (NAMENDA) 10 MG tablet Take 1 tablet (10 mg total) by mouth 2 (two) times daily. 180 tablet 4   nitroGLYCERIN (NITROSTAT) 0.4 MG SL tablet Place 1 tablet (0.4 mg total) under the tongue every 5 (five) minutes as needed. For chest pain. 25 tablet 11   pantoprazole (PROTONIX) 40 MG tablet Take 40 mg by mouth daily.     prasugrel (EFFIENT) 10 MG TABS tablet Take 1 tablet (10 mg total) by mouth daily. 90 tablet 3   No current facility-administered medications for this visit.    SUMMARY OF ONCOLOGIC HISTORY: Oncology History  History of colon cancer  12/04/2011 Initial Diagnosis   Colon cancer   11/03/2014 Imaging   CT scan showed no evidence of cancer recurrence.   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 The patient have very minimal M spike and asymptomatic. She is observed #2 T3, N0, M0 colon cancer This was discovered when the patient presented with severe anemia. Colonoscopy revealed abnormal lesion and  in 12/19/2011 Dr. Wakefield perform laparoscopic-assisted transverse colectomy. She received adjuvant Xeloda in August 2013 and switched to IV 5-FU in December 2013 due to poor tolerance to Xeloda. Her last chemotherapy is completed by March 2014. She has normal colonoscopy in July 2014. CT scan from 10/29/2013 show no evidence of recurrence.  PHYSICAL EXAMINATION: ECOG PERFORMANCE STATUS: 1 - Symptomatic but completely ambulatory  Vitals:   01/13/22 0923  BP: 138/80  Pulse: 91  Resp: 18  Temp: 98.2 F (36.8 C)  SpO2: 100%   Filed Weights   01/13/22 0923  Weight: 177 lb 6.4 oz (80.5 kg)    GENERAL:alert, no distress and comfortable SKIN: skin color, texture, turgor are normal, no rashes or significant lesions EYES: normal, Conjunctiva are pink and non-injected, sclera clear OROPHARYNX:no exudate, no erythema and lips, buccal mucosa, and tongue normal  NECK: supple, thyroid normal size, non-tender, without nodularity LYMPH:  no palpable lymphadenopathy in the cervical, axillary or inguinal LUNGS: clear to auscultation and percussion with normal breathing effort HEART: regular rate & rhythm and no murmurs and no   lower extremity edema ABDOMEN:abdomen soft, non-tender and normal bowel sounds Musculoskeletal:no cyanosis of digits and no clubbing  NEURO: alert & oriented x 3 with fluent speech, no focal motor/sensory deficits  LABORATORY DATA:  I have reviewed the data as listed    Component Value Date/Time   NA 133 (L) 01/06/2022 0900   NA 135 10/19/2021 0941   NA 137 07/11/2016 0820   K 3.4 (L) 01/06/2022 0900   K 4.4 07/11/2016 0820   CL 97 (L) 01/06/2022 0900   CL 106 12/25/2012 0924   CO2 25 01/06/2022 0900   CO2 27 07/11/2016 0820   GLUCOSE 112 (H) 01/06/2022 0900   GLUCOSE 115 07/11/2016 0820   GLUCOSE 105 (H) 12/25/2012 0924   BUN 16 01/06/2022 0900   BUN 12 10/19/2021 0941   BUN 15.6 07/11/2016 0820   CREATININE 0.77 01/06/2022 0900   CREATININE 0.8 07/11/2016  0820   CALCIUM 8.8 (L) 01/06/2022 0900   CALCIUM 8.9 07/11/2016 0820   PROT 7.4 01/06/2022 0900   PROT 7.7 10/19/2021 0941   PROT 7.1 07/11/2016 0820   ALBUMIN 4.1 01/06/2022 0900   ALBUMIN 4.6 10/19/2021 0941   ALBUMIN 3.6 07/11/2016 0820   AST 11 (L) 01/06/2022 0900   AST 13 07/11/2016 0820   ALT 10 01/06/2022 0900   ALT 14 07/11/2016 0820   ALKPHOS 67 01/06/2022 0900   ALKPHOS 89 07/11/2016 0820   BILITOT 0.3 01/06/2022 0900   BILITOT 0.3 10/19/2021 0941   BILITOT 0.29 07/11/2016 0820   GFRNONAA >60 01/06/2022 0900   GFRAA 97 08/17/2020 1111    No results found for: "SPEP", "UPEP"  Lab Results  Component Value Date   WBC 6.0 01/06/2022   NEUTROABS 3.5 01/06/2022   HGB 12.3 01/06/2022   HCT 37.4 01/06/2022   MCV 84.8 01/06/2022   PLT 361 01/06/2022      Chemistry      Component Value Date/Time   NA 133 (L) 01/06/2022 0900   NA 135 10/19/2021 0941   NA 137 07/11/2016 0820   K 3.4 (L) 01/06/2022 0900   K 4.4 07/11/2016 0820   CL 97 (L) 01/06/2022 0900   CL 106 12/25/2012 0924   CO2 25 01/06/2022 0900   CO2 27 07/11/2016 0820   BUN 16 01/06/2022 0900   BUN 12 10/19/2021 0941   BUN 15.6 07/11/2016 0820   CREATININE 0.77 01/06/2022 0900   CREATININE 0.8 07/11/2016 0820      Component Value Date/Time   CALCIUM 8.8 (L) 01/06/2022 0900   CALCIUM 8.9 07/11/2016 0820   ALKPHOS 67 01/06/2022 0900   ALKPHOS 89 07/11/2016 0820   AST 11 (L) 01/06/2022 0900   AST 13 07/11/2016 0820   ALT 10 01/06/2022 0900   ALT 14 07/11/2016 0820   BILITOT 0.3 01/06/2022 0900   BILITOT 0.3 10/19/2021 0941   BILITOT 0.29 07/11/2016 0820       RADIOGRAPHIC STUDIES: I have personally reviewed the radiological images as listed and agreed with the findings in the report. VAS [place] LOWER EXTREMITY VENOUS (DVT)  Result Date: 01/13/2022  Lower Venous DVT Study Patient Name:  Rachel Clark  Date of Exam:   01/13/2022 Medical Rec #: [id number]          Accession #:    [id number] Date  of Birth: 13-Feb-1951         Patient Gender: F Patient Age:   71 years Exam Location:  [facility] Procedure:  VAS US LOWER EXTREMITY VENOUS (DVT) Referring Phys:   --------------------------------------------------------------------------------  Indications: Pain.  Comparison Study: No prior study Performing Technologist: Michelle Simonetti MHA, RDMS, RVT, RDCS  Examination Guidelines: A complete evaluation includes B-mode imaging, spectral Doppler, color Doppler, and power Doppler as needed of all accessible portions of each vessel. Bilateral testing is considered an integral part of a complete examination. Limited examinations for reoccurring indications may be performed as noted. The reflux portion of the exam is performed with the patient in reverse Trendelenburg.  +---------+---------------+---------+-----------+----------+--------------+ RIGHT    CompressibilityPhasicitySpontaneityPropertiesThrombus Aging +---------+---------------+---------+-----------+----------+--------------+ CFV      Full           Yes      Yes                                 +---------+---------------+---------+-----------+----------+--------------+ SFJ      Full                                                        +---------+---------------+---------+-----------+----------+--------------+ FV Prox  Full                                                        +---------+---------------+---------+-----------+----------+--------------+ FV Mid   Full                                                        +---------+---------------+---------+-----------+----------+--------------+ FV DistalFull                                                        +---------+---------------+---------+-----------+----------+--------------+ PFV      Full                                                        +---------+---------------+---------+-----------+----------+--------------+ POP       Full           Yes      Yes                                 +---------+---------------+---------+-----------+----------+--------------+ PTV      Full                                                        +---------+---------------+---------+-----------+----------+--------------+ PERO     Full                                                        +---------+---------------+---------+-----------+----------+--------------+   +----+---------------+---------+-----------+----------+--------------+   LEFTCompressibilityPhasicitySpontaneityPropertiesThrombus Aging +----+---------------+---------+-----------+----------+--------------+ CFV Full           Yes      Yes                                 +----+---------------+---------+-----------+----------+--------------+     Summary: RIGHT: - There is no evidence of deep vein thrombosis in the lower extremity.  - No cystic structure found in the popliteal fossa.  LEFT: - No evidence of common femoral vein obstruction.  *See table(s) above for measurements and observations.    Preliminary    CUP PACEART REMOTE DEVICE CHECK  Result Date: 01/10/2022 Scheduled remote reviewed. Normal device function.  2 fast AV, <1min, 1:1, HR's 150's 1 AT/AF, 3min in duration, appear PAT Next remote 91 days. LA   

## 2022-01-13 NOTE — Assessment & Plan Note (Signed)
She has significant cramping of her legs I am concerned about risk of DVT due to recurrent hospitalization I ordered urgent ultrasound venous Doppler that came back negative for DVT I recommend follow-up with her primary care doctor for evaluation and management

## 2022-01-13 NOTE — Telephone Encounter (Signed)
Called Vascular lab. Patient has apt at 10 AM to for ultrasound on right calf. Advised pt to go to registration desk at [facility] to check in.

## 2022-01-13 NOTE — Assessment & Plan Note (Signed)
Overall, there is no progression of MGUS Her light chains are stable She has no signs of anemia, hypercalcemia or renal failure We will continue to see her once a year

## 2022-01-18 ENCOUNTER — Telehealth (HOSPITAL_COMMUNITY): Payer: Self-pay | Admitting: *Deleted

## 2022-01-18 NOTE — Telephone Encounter (Signed)
Cardiac rehab cardiac risk profile nursing assessment preformed via telephone, patient is optimistic about beginning program, patient reports "cramping" to right calf, denies redness, warmth, tenderness, patient reports LE dopplers negative for DVT via her oncology MD this week, she states right calf cramping does not impair her mobility; orientation date, time and location reviewed with patient, no further questions or concerns at this time.

## 2022-01-19 ENCOUNTER — Encounter (HOSPITAL_BASED_OUTPATIENT_CLINIC_OR_DEPARTMENT_OTHER): Payer: Self-pay | Admitting: Emergency Medicine

## 2022-01-19 ENCOUNTER — Emergency Department (HOSPITAL_BASED_OUTPATIENT_CLINIC_OR_DEPARTMENT_OTHER)
Admission: EM | Admit: 2022-01-19 | Discharge: 2022-01-19 | Disposition: A | Discharge status: Home / Self Care | Payer: Medicare Other | Attending: Emergency Medicine | Admitting: Emergency Medicine

## 2022-01-19 ENCOUNTER — Other Ambulatory Visit: Payer: Self-pay

## 2022-01-19 ENCOUNTER — Emergency Department (HOSPITAL_BASED_OUTPATIENT_CLINIC_OR_DEPARTMENT_OTHER): Payer: Medicare Other

## 2022-01-19 DIAGNOSIS — I2510 Atherosclerotic heart disease of native coronary artery without angina pectoris: Secondary | ICD-10-CM | POA: Diagnosis not present

## 2022-01-19 DIAGNOSIS — Z7982 Long term (current) use of aspirin: Secondary | ICD-10-CM | POA: Diagnosis not present

## 2022-01-19 DIAGNOSIS — X58XXXA Exposure to other specified factors, initial encounter: Secondary | ICD-10-CM | POA: Diagnosis not present

## 2022-01-19 DIAGNOSIS — S8991XA Unspecified injury of right lower leg, initial encounter: Secondary | ICD-10-CM | POA: Diagnosis present

## 2022-01-19 DIAGNOSIS — M25561 Pain in right knee: Secondary | ICD-10-CM | POA: Diagnosis not present

## 2022-01-19 DIAGNOSIS — Z85038 Personal history of other malignant neoplasm of large intestine: Secondary | ICD-10-CM | POA: Insufficient documentation

## 2022-01-19 DIAGNOSIS — S86811A Strain of other muscle(s) and tendon(s) at lower leg level, right leg, initial encounter: Secondary | ICD-10-CM

## 2022-01-19 DIAGNOSIS — S86911A Strain of unspecified muscle(s) and tendon(s) at lower leg level, right leg, initial encounter: Secondary | ICD-10-CM | POA: Diagnosis not present

## 2022-01-19 MED ORDER — DICLOFENAC SODIUM 1 % EX GEL: 2.0000 g | Freq: Four times a day (QID) | CUTANEOUS | 0 refills | Status: DC

## 2022-01-19 NOTE — Discharge Instructions (Signed)
X-ray shows no fracture but you do have some arthritis changes in your right knee.  Overall I suspect that you likely have calf strain.  Recommend as stated ice, Tylenol, rest.  Consider purchasing Voltaren gel.  I sent a prescription in for this.  Follow-up with orthopedics.

## 2022-01-19 NOTE — ED Triage Notes (Signed)
Pt arrives pov ambulatory c/o cramp in right calf x 3 weeks. Now also having right hip pain secondary to change in gait from calf pain. Seen at cancer center on 7/14, had [place] which was negative for dvt. Denies injury.

## 2022-01-19 NOTE — ED Provider Notes (Signed)
[facility] EMERGENCY DEPARTMENT Provider Note   CSN: [id number] Arrival date & time: 01/19/22  1343     History  Chief Complaint  Patient presents with   Leg Pain    Right Calf    Rachel Clark is a 71 y.o. female.  Patient here with ongoing right calf pain for the last several weeks.  Had a negative DVT study last week.  History of CAD, high cholesterol, MGUS, colon cancer.  She has had fairly constant pain.  Worse with movement.  Has been taking Tylenol with some improvement.  Denies any specific trauma.  Denies any weakness, numbness, fever, chills.  The history is provided by the patient.       Home Medications Prior to Admission medications   Medication Sig Start Date End Date Taking? Authorizing Provider  diclofenac Sodium (VOLTAREN) 1 % GEL Apply 2 g topically 4 (four) times daily. 01/19/22  Yes Taiquan Campanaro, DO  acetaminophen (TYLENOL) 500 MG tablet Take 500-1,000 mg by mouth daily as needed for mild pain or headache.    [provider]  aspirin 81 MG chewable tablet Chew 1 tablet (81 mg total) by mouth daily. 06/30/16   Arbutus Leas, NP  Azelastine HCl 0.15 % SOLN Place 1 spray into both nostrils daily as needed (seasonal allergies). 12/22/14   [provider]  Bempedoic Acid-Ezetimibe (NEXLIZET) 180-10 MG TABS Take 180 mg by mouth daily. 12/29/20   [name], Peter M, MD  calcium carbonate (TUMS - DOSED IN MG ELEMENTAL CALCIUM) 500 MG chewable tablet Chew 500 mg by mouth daily.    [provider]  carbamazepine (TEGRETOL XR) 200 MG 12 hr tablet Take 3 tablets (600 mg total) by mouth 2 (two) times daily. 09/13/21 12/07/22  Penumalli, Earlean Polka, MD  Cholecalciferol (VITAMIN D) 50 MCG (2000 UT) CAPS Take 2,000 Units by mouth daily.    [provider]  diphenhydrAMINE HCl (BENADRYL ALLERGY PO) Take 1 tablet by mouth daily as needed (allergy).    [provider]  diphenhydramine-acetaminophen (TYLENOL PM) 25-500 MG TABS  tablet Take 1 tablet by mouth at bedtime.    [provider]  DULoxetine (CYMBALTA) 60 MG capsule Take 60 mg by mouth 2 (two) times daily.    [provider]  ethosuximide (ZARONTIN) 250 MG capsule Take 2 capsules (500 mg total) by mouth 2 (two) times daily. 09/13/21 12/07/22  Penumalli, Earlean Polka, MD  Evolocumab with Infusor ([place]) 420 MG/3.5ML SOCT Inject 420 mg into the skin every 30 (thirty) days. 11/07/21   [name], Peter M, MD  Fluticasone Propionate [facility] ALLERGY RELIEF NA) Place 1 spray into the nose daily as needed (allergy relief).    [provider]  isosorbide mononitrate (IMDUR) 30 MG 24 hr tablet Take 0.5 tablets (15 mg total) by mouth daily. 11/11/21   Margie Billet, NP  levothyroxine (SYNTHROID, LEVOTHROID) 50 MCG tablet Take 50 mcg by mouth daily before breakfast.    [provider]  memantine (NAMENDA) 10 MG tablet Take 1 tablet (10 mg total) by mouth 2 (two) times daily. 09/13/21   Penumalli, Earlean Polka, MD  nitroGLYCERIN (NITROSTAT) 0.4 MG SL tablet Place 1 tablet (0.4 mg total) under the tongue every 5 (five) minutes as needed. For chest pain. 11/23/21   [name], Peter M, MD  pantoprazole (PROTONIX) 40 MG tablet Take 40 mg by mouth daily.    [provider]  prasugrel (EFFIENT) 10 MG TABS tablet Take 1 tablet (10 mg  total) by mouth daily. 03/10/21   Jerline Pain, MD      Allergies    Sulfamethoxazole, Aspirin, Crestor [rosuvastatin], Erythromycin, Lisinopril, Niacin and related, Penicillin g benzathine, Statins, Sulfa drugs cross reactors, Tetracyclines & related, Xeloda [capecitabine], Penicillins, and Plavix [clopidogrel bisulfate]    Review of Systems   Review of Systems  Physical Exam Updated Vital Signs BP (!) 154/58   Pulse 82   Temp 97.8 F (36.6 C) (Oral)   Resp 20   Ht '5\' 9"'$  (1.753 m)   Wt 80.5 kg   SpO2 98%   BMI 26.21 kg/m  Physical Exam Vitals and nursing note reviewed.  Constitutional:       General: She is not in acute distress.    Appearance: She is well-developed.  HENT:     Head: Normocephalic and atraumatic.  Eyes:     Conjunctiva/sclera: Conjunctivae normal.  Cardiovascular:     Rate and Rhythm: Normal rate and regular rhythm.     Pulses: Normal pulses.     Heart sounds: Normal heart sounds. No murmur heard. Pulmonary:     Effort: Pulmonary effort is normal. No respiratory distress.     Breath sounds: Normal breath sounds.  Abdominal:     Palpations: Abdomen is soft.     Tenderness: There is no abdominal tenderness.  Musculoskeletal:        General: Tenderness present. No swelling.     Cervical back: Neck supple.     Comments: Tenderness in the posterior fossa of the right knee/upper calf  Skin:    General: Skin is warm and dry.     Capillary Refill: Capillary refill takes less than 2 seconds.     Findings: No bruising or erythema.  Neurological:     General: No focal deficit present.     Mental Status: She is alert.     Sensory: No sensory deficit.     Motor: No weakness.     Comments: 5+ out of 5 strength throughout, normal sensation  Psychiatric:        Mood and Affect: Mood normal.     ED Results / Procedures / Treatments   Labs (all labs ordered are listed, but only abnormal results are displayed) Labs Reviewed - No data to display  EKG None  Radiology DG Knee Complete 4 Views Right  Result Date: 01/19/2022 CLINICAL DATA:  Right knee pain for 3 weeks EXAM: RIGHT KNEE - COMPLETE 4+ VIEW COMPARISON:  None Available. FINDINGS: Frontal, bilateral oblique, lateral views of the right knee are obtained. No fracture, subluxation, or dislocation. There is mild 3 compartmental osteoarthritis. No joint effusion. Soft tissues are unremarkable. IMPRESSION: 1. Mild 3 compartmental osteoarthritis.  No acute bony abnormality. Electronically Signed   By: Randa Ngo M.D.   On: 01/19/2022 15:54    Procedures Procedures    Medications Ordered in  ED Medications - No data to display  ED Course/ Medical Decision Making/ A&P                           Medical Decision Making Amount and/or Complexity of Data Reviewed Radiology: ordered.   Rachel Clark is here with right calf pain ongoing for the last several weeks.  Had negative DVT study last week.  Had unremarkable labs last week.  Fairly constant pain worse with movement.  Unremarkable vitals.  No fever.  She is got normal pulses in her lower extremities bilaterally.  She  has tenderness in the posterior fossa of the right knee, upper calf.  My suspicion is that this is likely musculoskeletal/calf strain.  I have no concern for DVT or peripheral arterial disease.  X-ray of the right knee was obtained that showed no acute process.  We will have her follow-up with orthopedic.  Will prescribe Voltaren gel.  Continue Tylenol and ice and rest.  Discharged in good condition.  This chart was dictated using voice recognition software.  Despite best efforts to proofread,  errors can occur which can change the documentation meaning.         Final Clinical Impression(s) / ED Diagnoses Final diagnoses:  Acute pain of right knee  Strain of calf muscle, right, initial encounter    Rx / DC Orders ED Discharge Orders          Ordered    diclofenac Sodium (VOLTAREN) 1 % GEL  4 times daily        01/19/22 1536              Codi Folkerts, DO 01/19/22 1558

## 2022-01-20 ENCOUNTER — Telehealth: Payer: Self-pay | Admitting: Cardiology

## 2022-01-20 NOTE — Telephone Encounter (Signed)
Pt c/o medication issue:  1. Name of Medication:   Voltarin and magnesium  2. How are you currently taking this medication (dosage and times per day)? N/A  3. Are you having a reaction (difficulty breathing--STAT)? No  4. What is your medication issue?   Husband is concerned that patient has been prescribed these new medications after an ER visit and he would like to get clarification on whether the patient should take these medications.

## 2022-01-20 NOTE — Telephone Encounter (Signed)
LMTCB

## 2022-01-23 NOTE — Telephone Encounter (Addendum)
Spoke with Jeneen Rinks, patient husband, in regards to spouses condition due to recent visit to ED, patient husband states Genieve would be unable to perform 6 minute walk test neccessary for CR orientation on 01/24/2022 due continuing right knee pain and recent fall with left knee pain, he states she required the assistance of 2 people to get from home to car yesterday, patient negative for DVT via LE dopplers, patient scheduled to see PCP, Dr. Harlan Stains, this coming week. Albertine Grates RN

## 2022-01-24 ENCOUNTER — Inpatient Hospital Stay (HOSPITAL_COMMUNITY): Admission: RE | Admit: 2022-01-24 | Payer: Medicare Other | Source: Ambulatory Visit

## 2022-01-24 ENCOUNTER — Other Ambulatory Visit: Payer: Self-pay | Admitting: Neurology

## 2022-01-25 DIAGNOSIS — M79604 Pain in right leg: Secondary | ICD-10-CM | POA: Diagnosis not present

## 2022-01-25 DIAGNOSIS — E559 Vitamin D deficiency, unspecified: Secondary | ICD-10-CM | POA: Diagnosis not present

## 2022-01-25 DIAGNOSIS — M8588 Other specified disorders of bone density and structure, other site: Secondary | ICD-10-CM | POA: Diagnosis not present

## 2022-01-30 ENCOUNTER — Ambulatory Visit (HOSPITAL_COMMUNITY): Payer: Medicare Other

## 2022-01-30 NOTE — Progress Notes (Signed)
Remote pacemaker transmission.   

## 2022-02-01 ENCOUNTER — Ambulatory Visit (HOSPITAL_COMMUNITY): Payer: Medicare Other

## 2022-02-01 DIAGNOSIS — H2513 Age-related nuclear cataract, bilateral: Secondary | ICD-10-CM | POA: Diagnosis not present

## 2022-02-03 ENCOUNTER — Ambulatory Visit (HOSPITAL_COMMUNITY): Payer: Medicare Other

## 2022-02-06 ENCOUNTER — Ambulatory Visit (HOSPITAL_COMMUNITY): Payer: Medicare Other

## 2022-02-07 ENCOUNTER — Other Ambulatory Visit: Payer: Self-pay | Admitting: Cardiology

## 2022-02-08 ENCOUNTER — Ambulatory Visit (HOSPITAL_COMMUNITY): Payer: Medicare Other

## 2022-02-10 ENCOUNTER — Ambulatory Visit (HOSPITAL_COMMUNITY): Payer: Medicare Other

## 2022-02-13 ENCOUNTER — Ambulatory Visit (HOSPITAL_COMMUNITY): Payer: Medicare Other

## 2022-02-15 ENCOUNTER — Ambulatory Visit (HOSPITAL_COMMUNITY): Payer: Medicare Other

## 2022-02-17 ENCOUNTER — Ambulatory Visit (HOSPITAL_COMMUNITY): Payer: Medicare Other

## 2022-02-20 ENCOUNTER — Ambulatory Visit (HOSPITAL_COMMUNITY): Payer: Medicare Other

## 2022-02-21 DIAGNOSIS — M79604 Pain in right leg: Secondary | ICD-10-CM | POA: Diagnosis not present

## 2022-02-21 DIAGNOSIS — M25551 Pain in right hip: Secondary | ICD-10-CM | POA: Diagnosis not present

## 2022-02-21 NOTE — Progress Notes (Signed)
Remote reviewed. Battery status noted.  Leads function stable

## 2022-02-22 ENCOUNTER — Ambulatory Visit (HOSPITAL_COMMUNITY): Payer: Medicare Other

## 2022-02-24 ENCOUNTER — Ambulatory Visit (HOSPITAL_COMMUNITY): Payer: Medicare Other

## 2022-02-27 ENCOUNTER — Ambulatory Visit (HOSPITAL_COMMUNITY): Payer: Medicare Other

## 2022-03-01 ENCOUNTER — Ambulatory Visit (HOSPITAL_COMMUNITY): Payer: Medicare Other

## 2022-03-03 ENCOUNTER — Ambulatory Visit (HOSPITAL_COMMUNITY): Payer: Medicare Other

## 2022-03-08 ENCOUNTER — Ambulatory Visit (HOSPITAL_COMMUNITY): Payer: Medicare Other

## 2022-03-10 ENCOUNTER — Ambulatory Visit (HOSPITAL_COMMUNITY): Payer: Medicare Other

## 2022-03-13 ENCOUNTER — Ambulatory Visit (HOSPITAL_COMMUNITY): Payer: Medicare Other

## 2022-03-15 ENCOUNTER — Ambulatory Visit (HOSPITAL_COMMUNITY): Payer: Medicare Other

## 2022-03-17 ENCOUNTER — Ambulatory Visit (HOSPITAL_COMMUNITY): Payer: Medicare Other

## 2022-03-20 ENCOUNTER — Ambulatory Visit (HOSPITAL_COMMUNITY): Payer: Medicare Other

## 2022-03-22 ENCOUNTER — Ambulatory Visit (HOSPITAL_COMMUNITY): Payer: Medicare Other

## 2022-03-22 DIAGNOSIS — I2510 Atherosclerotic heart disease of native coronary artery without angina pectoris: Secondary | ICD-10-CM | POA: Diagnosis not present

## 2022-03-22 DIAGNOSIS — Z23 Encounter for immunization: Secondary | ICD-10-CM | POA: Diagnosis not present

## 2022-03-22 DIAGNOSIS — I10 Essential (primary) hypertension: Secondary | ICD-10-CM | POA: Diagnosis not present

## 2022-03-22 DIAGNOSIS — E785 Hyperlipidemia, unspecified: Secondary | ICD-10-CM | POA: Diagnosis not present

## 2022-03-22 DIAGNOSIS — F331 Major depressive disorder, recurrent, moderate: Secondary | ICD-10-CM | POA: Diagnosis not present

## 2022-03-22 DIAGNOSIS — E039 Hypothyroidism, unspecified: Secondary | ICD-10-CM | POA: Diagnosis not present

## 2022-03-24 ENCOUNTER — Ambulatory Visit (HOSPITAL_COMMUNITY): Payer: Medicare Other

## 2022-03-27 ENCOUNTER — Ambulatory Visit (HOSPITAL_COMMUNITY): Payer: Medicare Other

## 2022-03-27 DIAGNOSIS — I200 Unstable angina: Secondary | ICD-10-CM | POA: Diagnosis not present

## 2022-03-27 DIAGNOSIS — I209 Angina pectoris, unspecified: Secondary | ICD-10-CM | POA: Diagnosis not present

## 2022-03-27 DIAGNOSIS — E785 Hyperlipidemia, unspecified: Secondary | ICD-10-CM | POA: Diagnosis not present

## 2022-03-27 LAB — HEPATIC FUNCTION PANEL
ALT: 10 IU/L (ref 0–32)
AST: 10 IU/L (ref 0–40)
Albumin: 4.4 g/dL (ref 3.8–4.8)
Alkaline Phosphatase: 74 IU/L (ref 44–121)
Bilirubin Total: 0.2 mg/dL (ref 0.0–1.2)
Bilirubin, Direct: 0.11 mg/dL (ref 0.00–0.40)
Total Protein: 7 g/dL (ref 6.0–8.5)

## 2022-03-27 LAB — LIPID PANEL
Chol/HDL Ratio: 1.8 ratio (ref 0.0–4.4)
Cholesterol, Total: 172 mg/dL (ref 100–199)
HDL: 95 mg/dL (ref >39)
LDL Chol Calc (NIH): 60 mg/dL (ref 0–99)
Triglycerides: 99 mg/dL (ref 0–149)
VLDL Cholesterol Cal: 17 mg/dL (ref 5–40)

## 2022-03-28 ENCOUNTER — Telehealth: Payer: Self-pay

## 2022-03-28 DIAGNOSIS — M25551 Pain in right hip: Secondary | ICD-10-CM | POA: Diagnosis not present

## 2022-03-28 NOTE — Telephone Encounter (Signed)
Received a message husband calling about patient.Tried calling him back at # [phone] left message on voice mail to call back.

## 2022-03-29 ENCOUNTER — Ambulatory Visit (HOSPITAL_COMMUNITY): Payer: Medicare Other

## 2022-03-30 ENCOUNTER — Telehealth: Payer: Self-pay | Admitting: Cardiology

## 2022-03-30 NOTE — Telephone Encounter (Signed)
Husband would like to get results of patient's LDL blood work done on Monday.  Husband stated he would also like to know if Dr. [name] would clear her to have a colonoscopy.

## 2022-03-30 NOTE — Telephone Encounter (Signed)
Left message for patient to return the call.

## 2022-03-31 ENCOUNTER — Ambulatory Visit (HOSPITAL_COMMUNITY): Payer: Medicare Other

## 2022-04-04 ENCOUNTER — Telehealth: Payer: Self-pay

## 2022-04-04 DIAGNOSIS — Z23 Encounter for immunization: Secondary | ICD-10-CM | POA: Diagnosis not present

## 2022-04-04 NOTE — Telephone Encounter (Signed)
Returned call to patient's husband left message on voice mail to call back.

## 2022-04-04 NOTE — Telephone Encounter (Signed)
Received a call from patient's husband calling to ask Dr.Jordan when it will be safe for wife to have a colonoscopy.Stated she will have to hold blood thinners before. She is past due to have colonoscopy.I will send message to [place] for advice.

## 2022-04-05 NOTE — Telephone Encounter (Signed)
Returned call to patient's husband no answer.Left Dr.Jordan's advice on personal voice mail.Advised to call me back if he had any questions.

## 2022-04-06 NOTE — Telephone Encounter (Signed)
Left message for husband to call back if question has not been  answered.

## 2022-04-18 ENCOUNTER — Telehealth: Payer: Self-pay | Admitting: *Deleted

## 2022-04-18 NOTE — Telephone Encounter (Signed)
   Name: Rachel Clark  DOB: 1950/08/29  MRN: [id number]  Primary Cardiologist: Peter [name], MD  Chart reviewed as part of pre-operative protocol coverage. The patient has an upcoming visit scheduled with Dr. [name] on 04/25/2022 at which time clearance can be addressed in case there are any issues that would impact surgical recommendations.  Colonoscopy is not scheduled until TBD as below. I added preop FYI to appointment note so that provider is aware to address at time of outpatient visit.  Per office protocol the cardiology provider should forward their finalized clearance decision and recommendations regarding antiplatelet therapy to the requesting party below.    I will route this message as FYI to requesting party and remove this message from the preop box as separate preop APP input not needed at this time.   Please call with any questions.  Lenna Sciara, NP  04/18/2022, 3:53 PM

## 2022-04-18 NOTE — Telephone Encounter (Signed)
   Pre-operative Risk Assessment    Patient Name: Rachel Clark  DOB: Nov 12, 1950 MRN: [id number]      Request for Surgical Clearance    Procedure:   COLONOSCOPY  Date of Surgery:  Clearance TBD                                 Surgeon:  DR. Michail Sermon Surgeon's Group or Practice Name:  [facility] Phone number:  [phone] Fax number:  [phone]   Type of Clearance Requested:   - Medical  - Pharmacy:  Hold Prasugrel (Effient)     Type of Anesthesia:   PROPOFOL    Additional requests/questions:    Jiles Prows   04/18/2022, 11:03 AM

## 2022-04-20 ENCOUNTER — Other Ambulatory Visit: Payer: Self-pay | Admitting: Home Health

## 2022-04-20 NOTE — Progress Notes (Signed)
Cardiology Office Note   Date:  04/25/2022   ID:  [name], DOB 06/01/1951, MRN [id number]  PCP:  Harlan Stains, MD  Cardiologist:   Iysis Germain [name], MD   Chief Complaint  Patient presents with   Coronary Artery Disease   History of Present Illness: Rachel Clark is a 71 y.o. female is seen for follow up CAD. 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 Subsequent failure of SVG to RCA. S/p extensive stenting of RCA in the past- last intervention in 2017 with PTCA of ostial RCA for restenosis. In August 2019 stented RCA was widely patent.    Since November 2021 she has had multiple episodes of dizziness and syncope. She was found to be orthostatic. Seen in our clinic and later by Dr Rayann Heman. Pacemaker function normal. Imdur was discontinued. Hydration seemed to improve symptoms. Echo was unremarkable.   Was seen in the ED on 09/04/20 with recurrent syncope after standing. She was orthostatic. Labs were unremarkable except mildly decreased sodium. No changes made at that time. Seen again in ED on May 11 with fall. Labs ok except low sodium level, pacer check and CXR Ok. Again hydrated. Her husband reports she passed out again on Father's day. Really doesn't have any warning. Is out only a few seconds. She has liberalized sodium intake. Wearing compression hose. Husband reports she naps 60% of the day. Seen recently by Dr Jannifer Franklin. No seizures noted. Husband has kept a BP record. Sometimes has a 20 point drop in BP with standing but at other times no change. We stopped her amlodipine.   She was admitted 03/08/21 with a NSTEMI. Ecg showed diffuse ST depression. Troponin peaked at 39. She underwent repeat cardiac cath showing patent LIMA to the LAD and patent stents in the RCA. There was new disease in a large ramus branch with  sequential high grade stenoses. She had PCI which was difficult due to tortuosity and calcification of the vessel making deliverablility of stents difficult. The disease was successfully covered with 3 DES. She was placed on Effient due to interaction of Brilinta with Tegretol. DC the following day. She has a history of rash on Plavix. Indefinite DAPT recommended due to extensive stents. Hgb at time of DC was 12.1.   She was readmitted on 03/13/21 with acute upper GI bleed with coffee ground emesis. Hgb dropped to 7.7 then to 6.5. she was transfused multiple units of PRBCs. DAPT was continued given recent stents. EGD showed a visible vessel in the gastric antrum treated with epinephrine injection and clipping. Treated with PPI. Bleeding scan was subsequently negative. Hgb improved to 9.6 at discharge.   She was seen in EP clinic in January. No significant arrhythmia noted and pacer OK.  She was admitted to [facility] in May 2023 with chest pain.  With low-level troponins she was initially treated medically and started on Imdur.  She returned to the ED later that evening with troponin rise in the 200s.  She underwent LHC with Difficult/complex but successful scoring balloon angioplasty with shockwave lithotripsy followed by distal overlapping DES stent placement to LCX.  With hemoglobin drop to 7.3, she received 2 units of pRBCs.   She underwent EGD showing small prepyloric gastric ulcer with no evidence of  active bleeding, some gastritis. Recommendations for IV protonix BID while inpatient, then switch to protonix '40mg'$   BID for 4 weeks at discharge, then protonix '40mg'$  daily indefinitely. Was seen in June and doing OK. No bleeding. Repeat CBC in July showed stable Hgb 10.2.   On follow up today with her husband she notes she is doing very well. No chest pain. No bleeding. Orthostatic symptoms have almost completely resolved. Is not very active.     Past Medical History:  Diagnosis Date   Arthritis    "fingers"  (06/29/2016)   Chronic lower back pain    Colon cancer (Rachel Clark) 12/04/2011   s/p Laparoscopic-assisted transverse colectomy on 12/19/2011 by Dr. Donne Hazel.  pT3 N0 M0.    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   last seizure was in 1972 ;takes Tegretol (06/29/2016)    Headache    "weekly" (06/29/2016)   Hyperlipidemia    Hyponatremia    Hypothyroid    Iron deficiency anemia 11/17/2011   Memory change 11/13/2016   Monoclonal gammopathy of unknown significance    Orthostatic hypotension    Presence of permanent cardiac pacemaker    Syncope and collapse    pacemaker implanted    Past Surgical History:  Procedure Laterality Date   ANTERIOR CERVICAL DECOMP/DISCECTOMY FUSION  1980's   BACK SURGERY     CARDIAC CATHETERIZATION  05/20/2009   obstructive native vessel disease in LAD, RCA, and first diagonal, patent vein graft to distal RCA and LIMA to LAD,normal. ef 60%   CARDIAC CATHETERIZATION N/A 03/24/2015   Procedure: Left Heart Cath and Cors/Grafts Angiography;  Surgeon: Wellington Hampshire, MD;  Location: [place] [facility];  Service: Cardiovascular;  Laterality: N/A;   CARDIAC CATHETERIZATION N/A 07/28/2015   Procedure: Left Heart Cath and Coronary Angiography;  Surgeon: Troy Sine, MD;  Location: [place] [facility];  Service: Cardiovascular;  Laterality: N/A;   CARDIAC CATHETERIZATION N/A 11/09/2015   Procedure: Coronary Stent Intervention;  Surgeon: Sherren Mocha, MD;  Location: [place] [facility];  Service: Cardiovascular;  Laterality: N/A;   CARDIAC CATHETERIZATION N/A 11/09/2015   Procedure: Left Heart Cath and Coronary Angiography;  Surgeon: Sherren Mocha, MD;  Location: [place] [facility];  Service: Cardiovascular;  Laterality: N/A;   CARDIAC CATHETERIZATION N/A 06/29/2016   Procedure: Left Heart  Cath and Coronary Angiography;  Surgeon: Nelva Bush, MD;  Location: [place] [facility];  Service: Cardiovascular;  Laterality: N/A;   CARDIAC CATHETERIZATION N/A 06/29/2016   Procedure: Intravascular Pressure Wire/FFR Study;  Surgeon: Nelva Bush, MD;  Location: [place] [facility];  Service: Cardiovascular;  Laterality: N/A;   CARDIAC CATHETERIZATION N/A 06/29/2016   Procedure: Coronary Balloon Angioplasty;  Surgeon: Nelva Bush, MD;  Location: [place] [facility];  Service: Cardiovascular;  Laterality: N/A;   COLON RESECTION  12/19/2011   Procedure: COLON RESECTION LAPAROSCOPIC;  Surgeon: Rolm Bookbinder, MD;  Location: [place];  Service: General;  Laterality: N/A;  laparoscopic hand assisted partial colon resection   COLON SURGERY     COLONOSCOPY     CORONARY ANGIOPLASTY WITH STENT PLACEMENT  ~ 2007   1 stent   CORONARY ARTERY BYPASS GRAFT  2007   "CABG X2"   DILATION AND CURETTAGE OF UTERUS  1973   EP IMPLANTABLE DEVICE N/A 03/25/2015   MDT Advisa DR pacemaker implanted by Dr Rayann Heman  for transient complete heart block and syncope   ESOPHAGOGASTRODUODENOSCOPY (EGD) WITH PROPOFOL N/A 03/14/2021   Procedure: ESOPHAGOGASTRODUODENOSCOPY (EGD) WITH PROPOFOL;  Surgeon: Ronnette Juniper, MD;  Location: [place];  Service: Gastroenterology;  Laterality: N/A;   ESOPHAGOGASTRODUODENOSCOPY (EGD) WITH PROPOFOL N/A 11/13/2021   Procedure: ESOPHAGOGASTRODUODENOSCOPY (EGD) WITH PROPOFOL;  Surgeon: Otis Brace, MD;  Location: [place];  Service: Gastroenterology;  Laterality: N/A;   HEMOSTASIS CLIP PLACEMENT  03/14/2021   Procedure: HEMOSTASIS CLIP PLACEMENT;  Surgeon: Ronnette Juniper, MD;  Location: [facility];  Service: Gastroenterology;;   INSERT / REPLACE / [place] CATH AND CORONARY ANGIOGRAPHY N/A 11/11/2021   Procedure: LEFT HEART CATH AND CORONARY ANGIOGRAPHY;  Surgeon: Leonie Man, MD;  Location: [place] [facility];  Service: Cardiovascular;  Laterality:  N/A;   LEFT HEART CATH AND CORS/GRAFTS ANGIOGRAPHY N/A 02/21/2018   Procedure: LEFT HEART CATH AND CORS/GRAFTS ANGIOGRAPHY;  Surgeon: Lorretta Harp, MD;  Location: [place] [facility];  Service: Cardiovascular;  Laterality: N/A;   LEFT HEART CATH AND CORS/GRAFTS ANGIOGRAPHY N/A 03/08/2021   Procedure: LEFT HEART CATH AND CORS/GRAFTS ANGIOGRAPHY;  Surgeon: [name], Dontavion Noxon M, MD;  Location: [place] [facility];  Service: Cardiovascular;  Laterality: N/A;   PORT-A-CATH REMOVAL N/A 06/12/2013   Procedure: REMOVAL PORT-A-CATH;  Surgeon: Rolm Bookbinder, MD;  Location: [place];  Service: General;  Laterality: N/A;   PORTACATH PLACEMENT  06/04/2012   Procedure: INSERTION PORT-A-CATH;  Surgeon: Rolm Bookbinder, MD;  Location: [place];  Service: General;  Laterality: N/A;  Insertion of port-a-cath    POSTERIOR LAMINECTOMY / [place]  03/14/2021   Procedure: SCLEROTHERAPY;  Surgeon: Ronnette Juniper, MD;  Location: [place];  Service: Gastroenterology;;   VAGINAL HYSTERECTOMY       Current Outpatient Medications  Medication Sig Dispense Refill   acetaminophen (TYLENOL) 500 MG tablet Take 500-1,000 mg by mouth daily as needed for mild pain or headache.     aspirin 81 MG chewable tablet Chew 1 tablet (81 mg total) by mouth daily.     Azelastine HCl 0.15 % SOLN Place 1 spray into both nostrils daily as needed (seasonal allergies).  12   calcium carbonate (TUMS - DOSED IN MG ELEMENTAL CALCIUM) 500 MG chewable tablet Chew 500 mg by mouth daily.     carbamazepine (TEGRETOL XR) 200 MG 12 hr tablet Take 3 tablets (600 mg total) by mouth 2 (two) times daily. 540 tablet 4   Cholecalciferol (VITAMIN D) 50 MCG (2000 UT) CAPS Take 4,000 Units by mouth daily.     diclofenac Sodium (VOLTAREN) 1 % GEL Apply 2 g topically 4 (four) times daily. 2 g 0   diphenhydrAMINE HCl (BENADRYL ALLERGY PO) Take 1 tablet by mouth daily as needed (allergy).      diphenhydramine-acetaminophen (TYLENOL PM) 25-500 MG TABS tablet Take 1 tablet by mouth at bedtime.     DULoxetine (CYMBALTA) 60 MG capsule Take 60 mg by mouth 2 (two) times daily.     ethosuximide (ZARONTIN) 250 MG capsule Take 2 capsules (500 mg total) by mouth 2 (two) times daily. 360 capsule 4   Evolocumab with Infusor (REPATHA PUSHTRONEX SYSTEM) 420 MG/3.5ML SOCT Inject 420 mg into the skin every 30 (thirty) days. 3.6 mL 11   Fluticasone Propionate (FLONASE ALLERGY RELIEF NA) Place 1 spray into the nose daily as needed (allergy relief).     isosorbide mononitrate (IMDUR) 30 MG 24 hr tablet TAKE  HALF TABLET ('15MG'$  TOTAL) BY MOUTH EVERY DAY 30 tablet 3   levothyroxine (SYNTHROID, LEVOTHROID) 50 MCG tablet Take 50 mcg by mouth daily before breakfast.     memantine (NAMENDA) 10 MG tablet Take 1 tablet (10 mg total) by mouth 2 (two) times daily. 180 tablet 4   nitroGLYCERIN (NITROSTAT) 0.4 MG SL tablet Place 1 tablet (0.4 mg total) under the tongue every 5 (five) minutes as needed. For chest pain. 25 tablet 11   pantoprazole (PROTONIX) 40 MG tablet TAKE ONE TABLET BY MOUTH TWICE DAILY FOR 30 DAYS THEN TAKE ONE TABLET BY MOUTH DAILY 120 tablet 2   Bempedoic Acid-Ezetimibe (NEXLIZET) 180-10 MG TABS Take 180 mg by mouth daily. 90 tablet 3   prasugrel (EFFIENT) 10 MG TABS tablet Take 1 tablet (10 mg total) by mouth daily. 90 tablet 3   No current facility-administered medications for this visit.    Allergies:   Sulfamethoxazole, Aspirin, Crestor [rosuvastatin], Erythromycin, Lisinopril, Niacin and related, Penicillin g benzathine, Statins, Sulfa drugs cross reactors, Tetracyclines & related, Xeloda [capecitabine], Penicillins, and Plavix [clopidogrel bisulfate]    Social History:  The patient  reports that she quit smoking about 40 years ago. Her smoking use included cigarettes. She has a 6.00 pack-year smoking history. She has never used smokeless tobacco. She reports current alcohol use of about 1.0  standard drink of alcohol per week. She reports that she does not use drugs.   Family History:  The patient's family history includes Cancer in her mother; Heart attack in her father; Heart attack (age of onset: 70) in her brother; Heart disease in her father.    ROS:  Please see the history of present illness.   Otherwise, review of systems are positive for none.   All other systems are reviewed and negative.    PHYSICAL EXAM: VS:  BP 130/62   Pulse 84   Ht '5\' 9"'$  (1.753 m)   Wt 178 lb 3.2 oz (80.8 kg)   BMI 26.32 kg/m  , BMI Body mass index is 26.32 kg/m.   GEN: Well nourished, well developed, in no acute distress  HEENT: normal  Neck: no JVD, carotid bruits, or masses Cardiac: RRR; no murmurs, rubs, or gallops,no edema  Respiratory:  clear to auscultation bilaterally, normal work of breathing GI: soft, nontender, nondistended, + BS MS: no deformity or atrophy. No groin hematoma. Skin: warm and dry, no rash Neuro:  Strength and sensation are intact Psych: euthymic mood, full affect   EKG:  EKG is ordered today. The ekg ordered today demonstrates NSR with frequent PVCs. Rate 84. Nonspecific ST abnormality. I have personally reviewed and interpreted this study.    Recent Labs: 11/10/2021: TSH 1.782 11/14/2021: Magnesium 2.0 01/06/2022: BUN 16; Creatinine, Ser 0.77; Hemoglobin 12.3; Platelets 361; Potassium 3.4; Sodium 133 03/27/2022: ALT 10   Dated 03/24/21: Hgb 12.2. normal BMET.  Dated 05/30/21: cholesterol 241, triglycerides 116, HDL 81, LDL 140. CMET and TSH normal.   Lipid Panel    Component Value Date/Time   CHOL 172 03/27/2022 1132   TRIG 99 03/27/2022 1132   HDL 95 03/27/2022 1132   CHOLHDL 1.8 03/27/2022 1132   CHOLHDL 2.3 03/09/2021 0218   VLDL 17 03/09/2021 0218   LDLCALC 60 03/27/2022 1132       Wt Readings from Last 3 Encounters:  04/25/22 178 lb 3.2 oz (80.8 kg)  01/19/22 177 lb 7.5 oz (80.5 kg)  01/13/22 177 lb 6.4 oz (80.5 kg)      Other  studies Reviewed: Additional studies/ records that were reviewed today include:   Cardiac cath 02/21/18:  LEFT HEART CATH AND CORS/GRAFTS ANGIOGRAPHY    Conclusion    Previously placed Ost RCA to Mid RCA stent (unknown type) is widely patent. Prox LAD lesion is 100% stenosed. Origin lesion is 100% stenosed. The left ventricular systolic function is normal. LV end diastolic pressure is normal. The left ventricular ejection fraction is 55-65% by visual estimate.     Echo 07/30/20: IMPRESSIONS     1. Left ventricular ejection fraction, by estimation, is 60 to 65%. The  left ventricle has normal function. The left ventricle has no regional  wall motion abnormalities. Left ventricular diastolic parameters are  consistent with Grade I diastolic  dysfunction (impaired relaxation).   2. Right ventricular systolic function is normal. The right ventricular  size is normal.   3. The mitral valve is normal in structure. Trivial mitral valve  regurgitation. No evidence of mitral stenosis.   4. The aortic valve is tricuspid. Aortic valve regurgitation is not  visualized. Mild aortic valve sclerosis is present, with no evidence of  aortic valve stenosis.   5. The inferior vena cava is normal in size with greater than 50%  respiratory variability, suggesting right atrial pressure of 3 mmHg.   Comparison(s): Prior images unable to be directly viewed, comparison made  by report only. No significant change from prior study. 07/26/15 EF 60-65%.  GLS -18.6%.   Cardiac cath 03/08/21:  LEFT HEART CATH AND CORS/GRAFTS ANGIOGRAPHY   Conclusion      Prox LAD lesion is 100% stenosed.   Origin lesion is 100% stenosed.   Ost LAD to Prox LAD lesion is 60% stenosed.   Ost 1st Diag to 1st Diag lesion is 85% stenosed.   Ramus lesion is 99% stenosed.   Ost Ramus to Ramus lesion is 90% stenosed.   Non-stenotic Ost RCA to Mid RCA lesion was previously treated.   A drug-eluting stent was successfully  placed using a STENT ONYX FRONTIER 3.0X15.   A drug-eluting stent was successfully placed using a SYNERGY XD 3.0X16.   A drug-eluting stent was successfully placed using a SYNERGY XD 3.0X20.   Post intervention, there is a 0% residual stenosis.   Post intervention, there is a 0% residual stenosis.   The left ventricular systolic function is normal.   LV end diastolic pressure is normal.   The left ventricular ejection fraction is 55-65% by visual estimate.   The radiation dose exceeded thresholds defined in the "Patient Radiation Dose Management For Interventional Medical Procedures With Extensive Use of Fluoroscopy" policy. Specific follow up instructions will be provided to the patient prior to discharge.   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 This is the culprit Normal LV function Normal LVEDP Successful PCI of the Ramus intermediate with DES x 3 in overlapping fashion.   Plan: DAPT for one year. Anticipate possible DC tomorrow.   Diagnostic Dominance: Right Intervention   Cardiac cath 11/11/21:  LEFT HEART CATH AND CORONARY ANGIOGRAPHY   Conclusion      Ost LAD to Prox LAD lesion is 60% stenosed. Prox LAD lesion is 100% stenosed.   LIMA-graft was visualized by angiography and is normal in caliber.  The graft exhibits no disease.   SVG-dRCA: Origin lesion is 100% stenosed.  graft was  not visualized due to known occlusion.   Ost RCA to Mid RCA overlapping full metal jacket DES stents, widely patent.   Ost 1st Diag to 1st Diag lesion is 85% stenosed.   ----------------------   Lesion segment #2 (all in-stent restenosis) Ost Ramus to Ramus lesion is 75% stenosed.  Ramus-2 lesion is 85% stenosed.  Ramus-1 lesion is 35% stenosed.   Scoring balloon angioplasty was performed using a BALLN SCOREFLEX 2.50X15 -> followed by post  dilation with a [place] Balloon 3.0 mm x 15 mm   Post intervention, there is a 20% residual stenosis  in the proximal portion of stent segment.  Post intervention, there is a 0% residual stenosis in the distal portion of the stent.   CULPRIT LESION #1: Ramus-3 lesion is 95% stenosed.-2 tandem locations of calcified stenosis.   Scoring balloon angioplasty was performed using a BALLN SCOREFLEX 2.50X15.   A drug-eluting stent was successfully placed (overlapping the distal stent) using a STENT ONYX FRONTIER 2.5X30 -> tapered postdilation from overlap 3.1 mm down to 2.8 mm distally.   Post intervention, there is a 0% residual stenosis.   ------------------------------------------------------------------------------   LV end diastolic pressure is normal.   There is no aortic valve stenosis.   SUMMARY Severe Native vessel CAD: stable severe proximal to mid LAD disease with LAD occlusion and patent LIMA-LAD,  widely patent full metal jacket stented RCA,  diffuse severe in-stent restenosis throughout the proximal and midportion of the stented segment of the native LCx with severe eccentric calcified lesions distal to the stent. Difficult/complex but successful scoring balloon angioplasty with shockwave lithotripsy followed by distal overlapping DES stent placement (Onyx Frontier DES 2.5 x 30 tapered postdilation from 3.1 in the overlap segment to 2.8 distally) Scoreflex angioplasty followed by post dilation with 3.0 mm [place] balloon throughout the entire stented segment of the LCx reducing stenosis in the midportion to 10% and more proximally to 20%. Normal LVEDP   RECOMMENDATION Monitor overnight, anticipate discharge in the morning. Aggressive risk factor modification Essentially lifelong finding.  In coverage with DAPT x1 year       Glenetta Hew, MD   Echo 11/12/21: IMPRESSIONS     1. Left ventricular ejection fraction, by estimation, is 60 to 65%. The  left ventricle has normal function. The left  ventricle has no regional  wall motion abnormalities. There is mild left ventricular hypertrophy.  Left ventricular diastolic parameters  are indeterminate.   2. Right ventricular systolic function is normal. The right ventricular  size is normal. Tricuspid regurgitation signal is inadequate for assessing  PA pressure.   3. The mitral valve is abnormal. Mild mitral valve regurgitation. No  evidence of mitral stenosis.   4. The aortic valve is tricuspid. Aortic valve regurgitation is not  visualized. No aortic stenosis is present.   5. The inferior vena cava is normal in size with greater than 50%  respiratory variability, suggesting right atrial pressure of 3 mmHg.    ASSESSMENT AND PLAN:  1.  Syncope. Secondary to orthostatic hypotension/and/or neurocardiogenic syncope. Pacer evaluation has been normal. Recommend liberalization of sodium in diet. Maintain good hydration. Need to allow more permissive BP. Currently doing very well.   2. CAD. S/p CABG. S/p extensive stenting of RCA.  in 2017. S/p NSTEMI with complex stenting of a large ramus intermediate branch with DES x 3 in September 2022.  Patent LIMA to LAD. Patent stents in RCA. Recommend indefinite DAPT. On ASA and Effient indefinitely. Admitted in May with NSTEMI  and complex PCI of the LCx by Dr Ellyn Hack. She is asymptomatic.   3. Hyperlipidemia. LDL goal < 70. Last LDL 60. Excellent response with Repatha with 50% reduction in LDL   4. SSS. S/p pacemaker. Followed in device clinic. Check on 01/10/22  was normal.   5. Acute upper GI bleed requiring transfusion. S/p clipping of visible vessel in gastric antrum. More recent EGD in May showed gastric ulcer (nonbleeding) and gastritis.  On PPI. Hgb improved to normal in July 12.3.  no recurrence. Will need to continue DAPT.   6. History of seizures. Monitored by Dr Jannifer Franklin.   7. Chronic mild hyponatremia.   8. Memory loss.    Current medicines are reviewed at length with the patient  today.  The patient does not have concerns regarding medicines.  The following changes have been made:  See above.  Labs/ tests ordered today include: none  Orders Placed This Encounter  Procedures   EKG 12-Lead      Disposition:   FU with me 6 months  Signed, Jordie Schreur [name], MD  04/25/2022 9:53 AM    [facility] [address] [place], [place], [address] Phone [phone], Fax [phone]

## 2022-04-25 ENCOUNTER — Ambulatory Visit: Payer: Medicare Other | Attending: Cardiology | Admitting: Cardiology

## 2022-04-25 ENCOUNTER — Encounter: Payer: Self-pay | Admitting: Cardiology

## 2022-04-25 VITALS — BP 130/62 | HR 84 | Ht 69.0 in | Wt 178.2 lb

## 2022-04-25 DIAGNOSIS — I951 Orthostatic hypotension: Secondary | ICD-10-CM | POA: Diagnosis not present

## 2022-04-25 DIAGNOSIS — E785 Hyperlipidemia, unspecified: Secondary | ICD-10-CM | POA: Diagnosis not present

## 2022-04-25 DIAGNOSIS — I200 Unstable angina: Secondary | ICD-10-CM

## 2022-04-25 DIAGNOSIS — I495 Sick sinus syndrome: Secondary | ICD-10-CM | POA: Insufficient documentation

## 2022-04-25 DIAGNOSIS — I2510 Atherosclerotic heart disease of native coronary artery without angina pectoris: Secondary | ICD-10-CM | POA: Insufficient documentation

## 2022-04-25 MED ORDER — PRASUGREL HCL 10 MG PO TABS: 10.0000 mg | ORAL_TABLET | Freq: Every day | ORAL | 3 refills | Status: DC

## 2022-04-25 MED ORDER — NEXLIZET 180-10 MG PO TABS
180.0000 mg | ORAL_TABLET | Freq: Every day | ORAL | 3 refills | Status: DC
Start: 2022-04-25 — End: 2024-03-24

## 2022-05-02 ENCOUNTER — Encounter (HOSPITAL_COMMUNITY): Payer: Medicare Other | Attending: Cardiology

## 2022-05-14 IMAGING — NM NM GI BLOOD LOSS
2 series · 12 of 12 positions shown · non-contrast
Comparison: November 03, 2014.

CLINICAL DATA: Melena.

EXAM:
NUCLEAR MEDICINE GASTROINTESTINAL BLEEDING SCAN
TECHNIQUE: Sequential abdominal images were obtained following intravenous
administration of Fc-WWm labeled red blood cells.
RADIOPHARMACEUTICALS:  24.7 mCi Fc-WWm pertechnetate in-vitro
labeled red cells.

[gi gi bleed · 4.52mm/px · 6 of 60 frames shown (1 of 2)]
[frame 6/60]
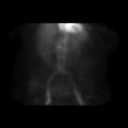
[frame 16/60]
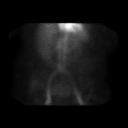
[frame 26/60]
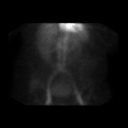
[frame 36/60]
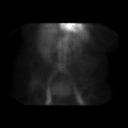
[frame 46/60]
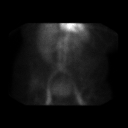
[frame 56/60]
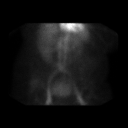

[gi gi bleed · 4.52mm/px · 6 of 60 frames shown (2 of 2)]
[frame 6/60]
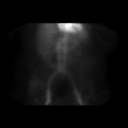
[frame 16/60]
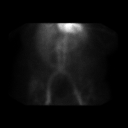
[frame 26/60]
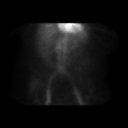
[frame 36/60]
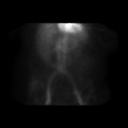
[frame 46/60]
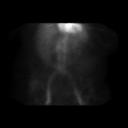
[frame 56/60]
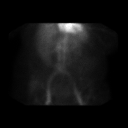

[12 of 12 positions shown; findings below may reference images not displayed]

FINDINGS: Normal uptake is noted in the liver and urinary bladder and
vasculature. No definite abnormal uptake is noted to suggest active
gastrointestinal bleeding.
IMPRESSION: No definite scintigraphic evidence of gastrointestinal bleeding.

## 2022-06-15 DIAGNOSIS — I10 Essential (primary) hypertension: Secondary | ICD-10-CM | POA: Diagnosis not present

## 2022-06-15 DIAGNOSIS — Z79899 Other long term (current) drug therapy: Secondary | ICD-10-CM | POA: Diagnosis not present

## 2022-06-15 DIAGNOSIS — E559 Vitamin D deficiency, unspecified: Secondary | ICD-10-CM | POA: Diagnosis not present

## 2022-06-15 DIAGNOSIS — G3184 Mild cognitive impairment, so stated: Secondary | ICD-10-CM | POA: Diagnosis not present

## 2022-06-15 DIAGNOSIS — G720 Drug-induced myopathy: Secondary | ICD-10-CM | POA: Diagnosis not present

## 2022-06-15 DIAGNOSIS — D472 Monoclonal gammopathy: Secondary | ICD-10-CM | POA: Diagnosis not present

## 2022-06-15 DIAGNOSIS — E039 Hypothyroidism, unspecified: Secondary | ICD-10-CM | POA: Diagnosis not present

## 2022-06-15 DIAGNOSIS — E785 Hyperlipidemia, unspecified: Secondary | ICD-10-CM | POA: Diagnosis not present

## 2022-06-15 DIAGNOSIS — M8588 Other specified disorders of bone density and structure, other site: Secondary | ICD-10-CM | POA: Diagnosis not present

## 2022-06-15 DIAGNOSIS — Z0000 Encounter for general adult medical examination without abnormal findings: Secondary | ICD-10-CM | POA: Diagnosis not present

## 2022-06-15 DIAGNOSIS — I25119 Atherosclerotic heart disease of native coronary artery with unspecified angina pectoris: Secondary | ICD-10-CM | POA: Diagnosis not present

## 2022-06-15 DIAGNOSIS — F324 Major depressive disorder, single episode, in partial remission: Secondary | ICD-10-CM | POA: Diagnosis not present

## 2022-06-15 DIAGNOSIS — M797 Fibromyalgia: Secondary | ICD-10-CM | POA: Diagnosis not present

## 2022-06-22 ENCOUNTER — Encounter (HOSPITAL_COMMUNITY)
Admission: RE | Admit: 2022-06-22 | Discharge: 2022-06-22 | Disposition: A | Discharge status: Home / Self Care | Payer: Medicare Other | Source: Ambulatory Visit | Attending: Cardiology | Admitting: Cardiology

## 2022-06-22 ENCOUNTER — Encounter (HOSPITAL_COMMUNITY): Payer: Self-pay

## 2022-06-22 VITALS — BP 122/64 | HR 92 | Ht 67.5 in | Wt 181.4 lb

## 2022-06-22 DIAGNOSIS — I252 Old myocardial infarction: Secondary | ICD-10-CM | POA: Insufficient documentation

## 2022-06-22 DIAGNOSIS — Z48812 Encounter for surgical aftercare following surgery on the circulatory system: Secondary | ICD-10-CM | POA: Diagnosis not present

## 2022-06-22 DIAGNOSIS — I214 Non-ST elevation (NSTEMI) myocardial infarction: Secondary | ICD-10-CM

## 2022-06-22 DIAGNOSIS — Z955 Presence of coronary angioplasty implant and graft: Secondary | ICD-10-CM | POA: Insufficient documentation

## 2022-06-22 NOTE — Progress Notes (Signed)
Patient here for cardiac rehab orientation entry blood pressure 122/64 sitting. Standing BP 98/70. Dellie did report feeling a little lightheaded when standing. Given water. Heart rate 91. Repeat orthostatic BP 106/62 sitting. Standing blood pressure 94/60. Symptoms improved after water given. 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 Will notify Dr [name] about symptoms, vital signs and ECG tracings. Raine completed 6 minute nustep test without difficulty.Harrell Gave RN BSN

## 2022-06-22 NOTE — Progress Notes (Signed)
Cardiac Individual Treatment Plan  Patient Details  Name: Rachel Clark MRN: [id number] Date of Birth: 1951/05/10 Referring Provider:   Flowsheet Row INTENSIVE CARDIAC REHAB ORIENT from 06/22/2022 in [facility] for Heart, Vascular, & [place]  Referring Provider Dr. Peter [name], MD       Initial Encounter Date:  [place] from 06/22/2022 in [facility] for Heart, Vascular, & Lung Health  Date 06/22/22       Visit Diagnosis: 11/10/21 NSTEMI  11/11/21 S/P DES [place]  Patient's Home Medications on Admission:  Current Outpatient Medications:    acetaminophen (TYLENOL) 500 MG tablet, Take 500-1,000 mg by mouth daily as needed for mild pain or headache., Disp: , Rfl:    aspirin 81 MG chewable tablet, Chew 1 tablet (81 mg total) by mouth daily., Disp: , Rfl:    Azelastine HCl 0.15 % SOLN, Place 1 spray into both nostrils daily as needed (seasonal allergies)., Disp: , Rfl: 12   Bempedoic Acid-Ezetimibe (NEXLIZET) 180-10 MG TABS, Take 180 mg by mouth daily., Disp: 90 tablet, Rfl: 3   calcium carbonate (TUMS - DOSED IN MG ELEMENTAL CALCIUM) 500 MG chewable tablet, Chew 500 mg by mouth daily., Disp: , Rfl:    carbamazepine (TEGRETOL XR) 200 MG 12 hr tablet, Take 3 tablets (600 mg total) by mouth 2 (two) times daily., Disp: 540 tablet, Rfl: 4   Cholecalciferol (VITAMIN D) 50 MCG (2000 UT) CAPS, Take 4,000 Units by mouth daily., Disp: , Rfl:    diphenhydrAMINE (BENADRYL) 25 MG tablet, Take 25 mg by mouth daily., Disp: , Rfl:    diphenhydramine-acetaminophen (TYLENOL PM) 25-500 MG TABS tablet, Take 1 tablet by mouth at bedtime., Disp: , Rfl:    DULoxetine (CYMBALTA) 60 MG capsule, Take 60 mg by mouth 2 (two) times daily., Disp: , Rfl:    ethosuximide (ZARONTIN) 250 MG capsule, Take 2 capsules (500 mg total) by mouth 2 (two) times daily., Disp: 360 capsule, Rfl: 4   Evolocumab with Infusor ([place]) 420 MG/3.5ML SOCT, Inject 420 mg into the skin every 30 (thirty) days., Disp: 3.6 mL, Rfl: 11   fluticasone (FLONASE) 50 MCG/ACT nasal spray, Place 1 spray into both nostrils daily as needed for allergies., Disp: , Rfl:    isosorbide mononitrate (IMDUR) 30 MG 24 hr tablet, TAKE HALF TABLET ('15MG'$  TOTAL) BY MOUTH EVERY DAY, Disp: 30 tablet, Rfl: 3   levothyroxine (SYNTHROID, LEVOTHROID) 50 MCG tablet, Take 50 mcg by mouth daily before breakfast., Disp: , Rfl:    memantine (NAMENDA) 10 MG tablet, Take 1 tablet (10 mg total) by mouth 2 (two) times daily., Disp: 180 tablet, Rfl: 4   nitroGLYCERIN (NITROSTAT) 0.4 MG SL tablet, Place 1 tablet (0.4 mg total) under the tongue every 5 (five) minutes as needed. For chest pain., Disp: 25 tablet, Rfl: 11   pantoprazole (PROTONIX) 40 MG tablet, TAKE ONE TABLET BY MOUTH TWICE DAILY FOR 30 DAYS THEN TAKE ONE TABLET BY MOUTH DAILY, Disp: 120 tablet, Rfl: 2   prasugrel (EFFIENT) 10 MG TABS tablet, Take 1 tablet (10 mg total) by mouth daily., Disp: 90 tablet, Rfl: 3   diclofenac Sodium (VOLTAREN) 1 % GEL, Apply 2 g topically 4 (four) times daily. (Patient not taking: Reported on 06/21/2022), Disp: 2 g, Rfl: 0  Past Medical History: Past Medical History:  Diagnosis Date   Arthritis    "fingers" (06/29/2016)   Chronic lower back pain  Colon cancer ([place]) 12/04/2011   s/p Laparoscopic-assisted transverse colectomy on 12/19/2011 by Dr. Donne Hazel.  pT3 N0 M0.    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   last seizure was in 1972 ;takes Tegretol (06/29/2016)    Headache    "weekly" (06/29/2016)   Hyperlipidemia    Hyponatremia    Hypothyroid    Iron deficiency anemia 11/17/2011   Memory change 11/13/2016   Monoclonal gammopathy of unknown significance     Orthostatic hypotension    Presence of permanent cardiac pacemaker    Syncope and collapse    pacemaker implanted    Tobacco Use: Social History   Tobacco Use  Smoking Status Former   Packs/day: 0.30   Years: 20.00   Total pack years: 6.00   Types: Cigarettes   Quit date: 06/06/1981   Years since quitting: 41.0  Smokeless Tobacco Never    Labs: Review Flowsheet  More data exists      Latest Ref Rng & Units 03/08/2021 03/09/2021 03/13/2021 10/19/2021 03/27/2022  Labs for ITP Cardiac and Pulmonary Rehab  Cholestrol 100 - 199 mg/dL - 157  - 236  172   LDL (calc) 0 - 99 mg/dL - 72  - 126  60   HDL-C >39 mg/dL - 68  - 87  95   Trlycerides 0 - 149 mg/dL - 86  - 135  99   PH, Arterial 7.350 - 7.450 7.213  - - - -  PCO2 arterial 32.0 - 48.0 mmHg 46.1  - - - -  Bicarbonate 20.0 - 28.0 mmol/L 18.5  - - - -  TCO2 22 - 32 mmol/L 20  - 24  - -  Acid-base deficit 0.0 - 2.0 mmol/L 9.0  - - - -  O2 Saturation % 97.0  - - - -    Capillary Blood Glucose: Lab Results  Component Value Date   GLUCAP 140 (H) 07/27/2015     Exercise Target Goals: Exercise Program Goal: Individual exercise prescription set using results from initial 6 min walk test and THRR while considering  patient's activity barriers and safety.   Exercise Prescription Goal: Initial exercise prescription builds to 30-45 minutes a day of aerobic activity, 2-3 days per week.  Home exercise guidelines will be given to patient during program as part of exercise prescription that the participant will acknowledge.  Activity Barriers & Risk Stratification:  Activity Barriers & Cardiac Risk Stratification - 06/22/22 1010       Activity Barriers & Cardiac Risk Stratification   Activity Barriers Arthritis;Deconditioning;Chest Pain/Angina;Balance Concerns    Cardiac Risk Stratification High             6 Minute Walk:  6 Minute Walk     Row Name 06/22/22 1003         6 Minute Walk   Phase Initial     Distance 1214  feet     Walk Time 6 minutes     # of Rest Breaks 0     MPH 2.3     METS 2.8     RPE 9     Perceived Dyspnea  0     VO2 Peak 9.81     Symptoms Yes (comment)     Comments Pre  dizzy orthostatic: 122/64 sit, 98/70 stand. Pre recheck after H2O 106/62 sit, 94/70 stand     Resting HR 92 bpm     Resting BP 122/64     Resting Oxygen Saturation  98 %     Exercise Oxygen Saturation  during 6 min walk 97 %     Max Ex. HR 110 bpm     Max Ex. BP 148/70     2 Minute Post BP 120/66              Oxygen Initial Assessment:   Oxygen Re-Evaluation:   Oxygen Discharge (Final Oxygen Re-Evaluation):   Initial Exercise Prescription:  Initial Exercise Prescription - 06/22/22 1000       Date of Initial Exercise RX and Referring Provider   Date 06/22/22    Referring Provider Dr. Peter [name], MD    Expected Discharge Date 08/18/22      NuStep   Level 1    SPM 75    Minutes 25    METs 1.8      Prescription Details   Frequency (times per week) 3    Duration Progress to 30 minutes of continuous aerobic without signs/symptoms of physical distress      Intensity   THRR 40-80% of Max Heartrate 59-118    Ratings of Perceived Exertion 11-13    Perceived Dyspnea 0-4      Progression   Progression Continue progressive overload as per policy without signs/symptoms or physical distress.      Resistance Training   Training Prescription Yes    Weight 2 lbs wts    Reps 10-15             Perform Capillary Blood Glucose checks as needed.  Exercise Prescription Changes:   Exercise Comments:   Exercise Goals and Review:   Exercise Goals     Row Name 06/22/22 1015             Exercise Goals   Increase Physical Activity Yes       Intervention Provide advice, education, support and counseling about physical activity/exercise needs.;Develop an individualized exercise prescription for aerobic and resistive training based on initial evaluation findings, risk stratification,  comorbidities and participant's personal goals.       Expected Outcomes Short Term: Attend rehab on a regular basis to increase amount of physical activity.;Long Term: Add in home exercise to make exercise part of routine and to increase amount of physical activity.;Long Term: Exercising regularly at least 3-5 days a week.       Increase Strength and Stamina Yes       Intervention Provide advice, education, support and counseling about physical activity/exercise needs.;Develop an individualized exercise prescription for aerobic and resistive training based on initial evaluation findings, risk stratification, comorbidities and participant's personal goals.       Expected Outcomes Short Term: Increase workloads from initial exercise prescription for resistance, speed, and METs.;Short Term: Perform resistance training exercises routinely during rehab and add in resistance training at home;Long Term: Improve cardiorespiratory fitness, muscular endurance and strength as measured by increased METs and functional capacity (6MWT)       Able to understand and use rate of perceived exertion (RPE) scale Yes       Intervention Provide education and explanation on how to use RPE scale       Expected Outcomes Short Term: Able to use RPE daily in rehab to express subjective intensity level;Long Term:  Able to use RPE to guide intensity  level when exercising independently       Knowledge and understanding of Target Heart Rate Range (THRR) Yes       Intervention Provide education and explanation of THRR including how the numbers were predicted and where they are located for reference       Expected Outcomes Short Term: Able to state/look up THRR;Long Term: Able to use THRR to govern intensity when exercising independently;Short Term: Able to use daily as guideline for intensity in rehab       Understanding of Exercise Prescription Yes       Intervention Provide education, explanation, and written materials on patient's  individual exercise prescription       Expected Outcomes Short Term: Able to explain program exercise prescription;Long Term: Able to explain home exercise prescription to exercise independently                Exercise Goals Re-Evaluation :   Discharge Exercise Prescription (Final Exercise Prescription Changes):   Nutrition:  Target Goals: Understanding of nutrition guidelines, daily intake of sodium '1500mg'$ , cholesterol '200mg'$ , calories 30% from fat and 7% or less from saturated fats, daily to have 5 or more servings of fruits and vegetables.  Biometrics:  Pre Biometrics - 06/22/22 1002       Pre Biometrics   Waist Circumference 41.5 inches    Hip Circumference 43.5 inches    Waist to Hip Ratio 0.95 %    Triceps Skinfold 21 mm    % Body Fat 39.8 %    Grip Strength 23 kg    Flexibility 12.25 in    Single Leg Stand 6 seconds              Nutrition Therapy Plan and Nutrition Goals:   Nutrition Assessments:  Nutrition Assessments - 06/22/22 1023       MEDFICTS Scores   Pre Score 47            MEDIFICTS Score Key: ?70 Need to make dietary changes  40-70 Heart Healthy Diet ? 40 Therapeutic Level Cholesterol Diet   Flowsheet Row INTENSIVE CARDIAC REHAB ORIENT from 06/22/2022 in [facility] for Heart, Vascular, & Lung Health  Picture Your Plate Total Score on Admission 47      Picture Your Plate Scores: <93 Unhealthy dietary pattern with much room for improvement. 41-50 Dietary pattern unlikely to meet recommendations for good health and room for improvement. 51-60 More healthful dietary pattern, with some room for improvement.  >60 Healthy dietary pattern, although there may be some specific behaviors that could be improved.    Nutrition Goals Re-Evaluation:   Nutrition Goals Re-Evaluation:   Nutrition Goals Discharge (Final Nutrition Goals Re-Evaluation):   Psychosocial: Target Goals: Acknowledge presence or absence  of significant depression and/or stress, maximize coping skills, provide positive support system. Participant is able to verbalize types and ability to use techniques and skills needed for reducing stress and depression.  Initial Review & Psychosocial Screening:  Initial Psych Review & Screening - 06/22/22 1456       Initial Review   Current issues with Current Stress Concerns;History of Depression    Source of Stress Concerns Chronic Illness    Comments Chandrika denies having any depression has health stressor regarding CAD and issues with orthostastic hypotension. Shandria is on an antidepressant which currently controls her depression.      Family Dynamics   Good Support System? Yes   Romy has her husband Clair Gulling for support  Barriers   Psychosocial barriers to participate in program The patient should benefit from training in stress management and relaxation.      Screening Interventions   Interventions Encouraged to exercise    Expected Outcomes Long Term Goal: Stressors or current issues are controlled or eliminated.             Quality of Life Scores:  Quality of Life - 06/22/22 1020       Quality of Life   Select Quality of Life      Quality of Life Scores   Health/Function Pre 24.6 %    Socioeconomic Pre 28.5 %    Psych/Spiritual Pre 21.43 %    Family Pre 25.4 %    GLOBAL Pre 24.52 %            Scores of 19 and below usually indicate a poorer quality of life in these areas.  A difference of  2-3 points is a clinically meaningful difference.  A difference of 2-3 points in the total score of the Quality of Life Index has been associated with significant improvement in overall quality of life, self-image, physical symptoms, and general health in studies assessing change in quality of life.  PHQ-9: Review Flowsheet       06/22/2022 08/30/2021 06/14/2021  Depression screen PHQ 2/9  Decreased Interest 0 0 1  Down, Depressed, Hopeless 0 1 0  PHQ - 2 Score 0 1  1  Altered sleeping - - 0  Tired, decreased energy - - 3  Change in appetite - - 0  Feeling bad or failure about yourself  - - 0  Trouble concentrating - - 1  Moving slowly or fidgety/restless - - 0  Suicidal thoughts - - 0  PHQ-9 Score - - 5  Difficult doing work/chores - - Not difficult at all   Interpretation of Total Score  Total Score Depression Severity:  1-4 = Minimal depression, 5-9 = Mild depression, 10-14 = Moderate depression, 15-19 = Moderately severe depression, 20-27 = Severe depression   Psychosocial Evaluation and Intervention:   Psychosocial Re-Evaluation:   Psychosocial Discharge (Final Psychosocial Re-Evaluation):   Vocational Rehabilitation: Provide vocational rehab assistance to qualifying candidates.   Vocational Rehab Evaluation & Intervention:  Vocational Rehab - 06/22/22 1458       Initial Vocational Rehab Evaluation & Intervention   Assessment shows need for Vocational Rehabilitation No   Thomasenia is retired and does not need vocational rehab at this time            Education: Education Goals: Education classes will be provided on a weekly basis, covering required topics. Participant will state understanding/return demonstration of topics presented.     Core Videos: Exercise    Move It!  Clinical staff conducted group or individual video education with verbal and written material and guidebook.  Patient learns the recommended Pritikin exercise program. Exercise with the goal of living a long, healthy life. Some of the health benefits of exercise include controlled diabetes, healthier blood pressure levels, improved cholesterol levels, improved heart and lung capacity, improved sleep, and better body composition. Everyone should speak with their doctor before starting or changing an exercise routine.  Biomechanical Limitations Clinical staff conducted group or individual video education with verbal and written material and guidebook.   Patient learns how biomechanical limitations can impact exercise and how we can mitigate and possibly overcome limitations to have an impactful and balanced exercise routine.  Body Composition Clinical staff conducted group or individual  video education with verbal and written material and guidebook.  Patient learns that body composition (ratio of muscle mass to fat mass) is a key component to assessing overall fitness, rather than body weight alone. Increased fat mass, especially visceral belly fat, can put [place] at increased risk for metabolic syndrome, type 2 diabetes, heart disease, and even death. It is recommended to combine diet and exercise (cardiovascular and resistance training) to improve your body composition. Seek guidance from your physician and exercise physiologist before implementing an exercise routine.  Exercise Action Plan Clinical staff conducted group or individual video education with verbal and written material and guidebook.  Patient learns the recommended strategies to achieve and enjoy long-term exercise adherence, including variety, self-motivation, self-efficacy, and positive decision making. Benefits of exercise include fitness, good health, weight management, more energy, better sleep, less stress, and overall well-being.  Medical   Heart Disease Risk Reduction Clinical staff conducted group or individual video education with verbal and written material and guidebook.  Patient learns our heart is our most vital organ as it circulates oxygen, nutrients, white blood cells, and hormones throughout the entire body, and carries waste away. Data supports a plant-based eating plan like the Pritikin Program for its effectiveness in slowing progression of and reversing heart disease. The video provides a number of recommendations to address heart disease.   Metabolic Syndrome and Belly Fat  Clinical staff conducted group or individual video education with verbal and written  material and guidebook.  Patient learns what metabolic syndrome is, how it leads to heart disease, and how one can reverse it and keep it from coming back. You have metabolic syndrome if you have 3 of the following 5 criteria: abdominal obesity, high blood pressure, high triglycerides, low HDL cholesterol, and high blood sugar.  Hypertension and Heart Disease Clinical staff conducted group or individual video education with verbal and written material and guidebook.  Patient learns that high blood pressure, or hypertension, is very common in the [place]. Hypertension is largely due to excessive salt intake, but other important risk factors include being overweight, physical inactivity, drinking too much alcohol, smoking, and not eating enough potassium from fruits and vegetables. High blood pressure is a leading risk factor for heart attack, stroke, congestive heart failure, dementia, kidney failure, and premature death. Long-term effects of excessive salt intake include stiffening of the arteries and thickening of heart muscle and organ damage. Recommendations include ways to reduce hypertension and the risk of heart disease.  Diseases of Our Time - Focusing on Diabetes Clinical staff conducted group or individual video education with verbal and written material and guidebook.  Patient learns why the best way to stop diseases of our time is prevention, through food and other lifestyle changes. Medicine (such as prescription pills and surgeries) is often only a Band-Aid on the problem, not a long-term solution. Most common diseases of our time include obesity, type 2 diabetes, hypertension, heart disease, and cancer. The Pritikin Program is recommended and has been proven to help reduce, reverse, and/or prevent the damaging effects of metabolic syndrome.  Nutrition   Overview of the Pritikin Eating Plan  Clinical staff conducted group or individual video education with verbal and written material  and guidebook.  Patient learns about the [place] for disease risk reduction. The [place] emphasizes a wide variety of unrefined, minimally-processed carbohydrates, like fruits, vegetables, whole grains, and legumes. Go, Caution, and Stop food choices are explained. Plant-based and lean animal proteins are emphasized.  Rationale provided for low sodium intake for blood pressure control, low added sugars for blood sugar stabilization, and low added fats and oils for coronary artery disease risk reduction and weight management.  Calorie Density  Clinical staff conducted group or individual video education with verbal and written material and guidebook.  Patient learns about calorie density and how it impacts the Pritikin Eating Plan. Knowing the characteristics of the food you choose will help you decide whether those foods will lead to weight gain or weight loss, and whether you want to consume more or less of them. Weight loss is usually a side effect of the Pritikin Eating Plan because of its focus on low calorie-dense foods.  Label Reading  Clinical staff conducted group or individual video education with verbal and written material and guidebook.  Patient learns about the Pritikin recommended label reading guidelines and corresponding recommendations regarding calorie density, added sugars, sodium content, and whole grains.  Dining Out - Part 1  Clinical staff conducted group or individual video education with verbal and written material and guidebook.  Patient learns that restaurant meals can be sabotaging because they can be so high in calories, fat, sodium, and/or sugar. Patient learns recommended strategies on how to positively address this and avoid unhealthy pitfalls.  Facts on Fats  Clinical staff conducted group or individual video education with verbal and written material and guidebook.  Patient learns that lifestyle modifications can be just as effective, if not  more so, as many medications for lowering your risk of heart disease. A Pritikin lifestyle can help to reduce your risk of inflammation and atherosclerosis (cholesterol build-up, or plaque, in the artery walls). Lifestyle interventions such as dietary choices and physical activity address the cause of atherosclerosis. A review of the types of fats and their impact on blood cholesterol levels, along with dietary recommendations to reduce fat intake is also included.  Nutrition Action Plan  Clinical staff conducted group or individual video education with verbal and written material and guidebook.  Patient learns how to incorporate Pritikin recommendations into their lifestyle. Recommendations include planning and keeping personal health goals in mind as an important part of their success.  Healthy Mind-Set    Healthy Minds, Bodies, Hearts  Clinical staff conducted group or individual video education with verbal and written material and guidebook.  Patient learns how to identify when they are stressed. Video will discuss the impact of that stress, as well as the many benefits of stress management. Patient will also be introduced to stress management techniques. The way we think, act, and feel has an impact on our hearts.  How Our Thoughts Can Heal Our Hearts  Clinical staff conducted group or individual video education with verbal and written material and guidebook.  Patient learns that negative thoughts can cause depression and anxiety. This can result in negative lifestyle behavior and serious health problems. Cognitive behavioral therapy is an effective method to help control our thoughts in order to change and improve our emotional outlook.  Additional Videos:  Exercise    Improving Performance  Clinical staff conducted group or individual video education with verbal and written material and guidebook.  Patient learns to use a non-linear approach by alternating intensity levels and lengths of  time spent exercising to help burn more calories and lose more body fat. Cardiovascular exercise helps improve heart health, metabolism, hormonal balance, blood sugar control, and recovery from fatigue. Resistance training improves strength, endurance, balance, coordination, reaction time, metabolism, and muscle mass. Flexibility exercise  improves circulation, posture, and balance. Seek guidance from your physician and exercise physiologist before implementing an exercise routine and learn your capabilities and proper form for all exercise.  Introduction to Yoga  Clinical staff conducted group or individual video education with verbal and written material and guidebook.  Patient learns about yoga, a discipline of the coming together of mind, breath, and body. The benefits of yoga include improved flexibility, improved range of motion, better posture and core strength, increased lung function, weight loss, and positive self-image. Yoga's heart health benefits include lowered blood pressure, healthier heart rate, decreased cholesterol and triglyceride levels, improved immune function, and reduced stress. Seek guidance from your physician and exercise physiologist before implementing an exercise routine and learn your capabilities and proper form for all exercise.  Medical   Aging: Enhancing Your Quality of Life  Clinical staff conducted group or individual video education with verbal and written material and guidebook.  Patient learns key strategies and recommendations to stay in good physical health and enhance quality of life, such as prevention strategies, having an advocate, securing a [place], and keeping a list of medications and system for tracking them. It also discusses how to avoid risk for bone loss.  Biology of Weight Control  Clinical staff conducted group or individual video education with verbal and written material and guidebook.  Patient learns that weight  gain occurs because we consume more calories than we burn (eating more, moving less). Even if your body weight is normal, you may have higher ratios of fat compared to muscle mass. Too much body fat puts you at increased risk for cardiovascular disease, heart attack, stroke, type 2 diabetes, and obesity-related cancers. In addition to exercise, following the [place] can help reduce your risk.  Decoding Lab Results  Clinical staff conducted group or individual video education with verbal and written material and guidebook.  Patient learns that lab test reflects one measurement whose values change over time and are influenced by many factors, including medication, stress, sleep, exercise, food, hydration, pre-existing medical conditions, and more. It is recommended to use the knowledge from this video to become more involved with your lab results and evaluate your numbers to speak with your doctor.   Diseases of Our Time - Overview  Clinical staff conducted group or individual video education with verbal and written material and guidebook.  Patient learns that according to the CDC, 50% to 70% of chronic diseases (such as obesity, type 2 diabetes, elevated lipids, hypertension, and heart disease) are avoidable through lifestyle improvements including healthier food choices, listening to satiety cues, and increased physical activity.  Sleep Disorders Clinical staff conducted group or individual video education with verbal and written material and guidebook.  Patient learns how good quality and duration of sleep are important to overall health and well-being. Patient also learns about sleep disorders and how they impact health along with recommendations to address them, including discussing with a physician.  Nutrition  Dining Out - Part 2 Clinical staff conducted group or individual video education with verbal and written material and guidebook.  Patient learns how to plan ahead and  communicate in order to maximize their dining experience in a healthy and nutritious manner. Included are recommended food choices based on the type of restaurant the patient is visiting.   Fueling a Best boy conducted group or individual video education with verbal and written material and guidebook.  There is a strong connection  between our food choices and our health. Diseases like obesity and type 2 diabetes are very prevalent and are in large-part due to lifestyle choices. The Pritikin Eating Plan provides plenty of food and hunger-curbing satisfaction. It is easy to follow, affordable, and helps reduce health risks.  Menu Workshop  Clinical staff conducted group or individual video education with verbal and written material and guidebook.  Patient learns that restaurant meals can sabotage health goals because they are often packed with calories, fat, sodium, and sugar. Recommendations include strategies to plan ahead and to communicate with the manager, chef, or server to help order a healthier meal.  Planning Your Eating Strategy  Clinical staff conducted group or individual video education with verbal and written material and guidebook.  Patient learns about the [place] and its benefit of reducing the risk of disease. The [place] does not focus on calories. Instead, it emphasizes high-quality, nutrient-rich foods. By knowing the characteristics of the foods, we choose, we can determine their calorie density and make informed decisions.  Targeting Your Nutrition Priorities  Clinical staff conducted group or individual video education with verbal and written material and guidebook.  Patient learns that lifestyle habits have a tremendous impact on disease risk and progression. This video provides eating and physical activity recommendations based on your personal health goals, such as reducing LDL cholesterol, losing weight, preventing or controlling  type 2 diabetes, and reducing high blood pressure.  Vitamins and Minerals  Clinical staff conducted group or individual video education with verbal and written material and guidebook.  Patient learns different ways to obtain key vitamins and minerals, including through a recommended healthy diet. It is important to discuss all supplements you take with your doctor.   Healthy Mind-Set    Smoking Cessation  Clinical staff conducted group or individual video education with verbal and written material and guidebook.  Patient learns that cigarette smoking and tobacco addiction pose a serious health risk which affects millions of people. Stopping smoking will significantly reduce the risk of heart disease, lung disease, and many forms of cancer. Recommended strategies for quitting are covered, including working with your doctor to develop a successful plan.  Culinary   Becoming a Financial trader conducted group or individual video education with verbal and written material and guidebook.  Patient learns that cooking at home can be healthy, cost-effective, quick, and puts them in control. Keys to cooking healthy recipes will include looking at your recipe, assessing your equipment needs, planning ahead, making it simple, choosing cost-effective seasonal ingredients, and limiting the use of added fats, salts, and sugars.  Cooking - Breakfast and Snacks  Clinical staff conducted group or individual video education with verbal and written material and guidebook.  Patient learns how important breakfast is to satiety and nutrition through the entire day. Recommendations include key foods to eat during breakfast to help stabilize blood sugar levels and to prevent overeating at meals later in the day. Planning ahead is also a key component.  Cooking - Human resources officer conducted group or individual video education with verbal and written material and guidebook.  Patient learns  eating strategies to improve overall health, including an approach to cook more at home. Recommendations include thinking of animal protein as a side on your plate rather than center stage and focusing instead on lower calorie dense options like vegetables, fruits, whole grains, and plant-based proteins, such as beans. Making sauces in large quantities to freeze  for later and leaving the skin on your vegetables are also recommended to maximize your experience.  Cooking - Healthy Salads and Dressing Clinical staff conducted group or individual video education with verbal and written material and guidebook.  Patient learns that vegetables, fruits, whole grains, and legumes are the foundations of the [place]. Recommendations include how to incorporate each of these in flavorful and healthy salads, and how to create homemade salad dressings. Proper handling of ingredients is also covered. Cooking - Soups and [employer] - Soups and Desserts Clinical staff conducted group or individual video education with verbal and written material and guidebook.  Patient learns that Pritikin soups and desserts make for easy, nutritious, and delicious snacks and meal components that are low in sodium, fat, sugar, and calorie density, while high in vitamins, minerals, and filling fiber. Recommendations include simple and healthy ideas for soups and desserts.   Overview     The Pritikin Solution Program Overview Clinical staff conducted group or individual video education with verbal and written material and guidebook.  Patient learns that the results of the [place] Program have been documented in more than 100 articles published in peer-reviewed journals, and the benefits include reducing risk factors for (and, in some cases, even reversing) high cholesterol, high blood pressure, type 2 diabetes, obesity, and more! An overview of the three key pillars of the Pritikin Program will be covered: eating well,  doing regular exercise, and having a healthy mind-set.  WORKSHOPS  Exercise: Exercise Basics: Building Your Action Plan Clinical staff led group instruction and group discussion with PowerPoint presentation and patient guidebook. To enhance the learning environment the use of posters, models and videos may be added. At the conclusion of this workshop, patients will comprehend the difference between physical activity and exercise, as well as the benefits of incorporating both, into their routine. Patients will understand the FITT (Frequency, Intensity, Time, and Type) principle and how to use it to build an exercise action plan. In addition, safety concerns and other considerations for exercise and cardiac rehab will be addressed by the presenter. The purpose of this lesson is to promote a comprehensive and effective weekly exercise routine in order to improve patients' overall level of fitness.   Managing Heart Disease: Your Path to a Healthier Heart Clinical staff led group instruction and group discussion with PowerPoint presentation and patient guidebook. To enhance the learning environment the use of posters, models and videos may be added.At the conclusion of this workshop, patients will understand the anatomy and physiology of the heart. Additionally, they will understand how Pritikin's three pillars impact the risk factors, the progression, and the management of heart disease.  The purpose of this lesson is to provide a high-level overview of the heart, heart disease, and how the Pritikin lifestyle positively impacts risk factors.  Exercise Biomechanics Clinical staff led group instruction and group discussion with PowerPoint presentation and patient guidebook. To enhance the learning environment the use of posters, models and videos may be added. Patients will learn how the structural parts of their bodies function and how these functions impact their daily activities, movement, and  exercise. Patients will learn how to promote a neutral spine, learn how to manage pain, and identify ways to improve their physical movement in order to promote healthy living. The purpose of this lesson is to expose patients to common physical limitations that impact physical activity. Participants will learn practical ways to adapt and manage aches and pains, and to minimize  their effect on regular exercise. Patients will learn how to maintain good posture while sitting, walking, and lifting.  Balance Training and Fall Prevention  Clinical staff led group instruction and group discussion with PowerPoint presentation and patient guidebook. To enhance the learning environment the use of posters, models and videos may be added. At the conclusion of this workshop, patients will understand the importance of their sensorimotor skills (vision, proprioception, and the vestibular system) in maintaining their ability to balance as they age. Patients will apply a variety of balancing exercises that are appropriate for their current level of function. Patients will understand the common causes for poor balance, possible solutions to these problems, and ways to modify their physical environment in order to minimize their fall risk. The purpose of this lesson is to teach patients about the importance of maintaining balance as they age and ways to minimize their risk of falling.  WORKSHOPS   Nutrition:  Fueling a Scientist, research (physical sciences) led group instruction and group discussion with PowerPoint presentation and patient guidebook. To enhance the learning environment the use of posters, models and videos may be added. Patients will review the foundational principles of the [place] and understand what constitutes a serving size in each of the food groups. Patients will also learn Pritikin-friendly foods that are better choices when away from home and review make-ahead meal and snack options.  Calorie density will be reviewed and applied to three nutrition priorities: weight maintenance, weight loss, and weight gain. The purpose of this lesson is to reinforce (in a group setting) the key concepts around what patients are recommended to eat and how to apply these guidelines when away from home by planning and selecting Pritikin-friendly options. Patients will understand how calorie density may be adjusted for different weight management goals.  Mindful Eating  Clinical staff led group instruction and group discussion with PowerPoint presentation and patient guidebook. To enhance the learning environment the use of posters, models and videos may be added. Patients will briefly review the concepts of the [place] and the importance of low-calorie dense foods. The concept of mindful eating will be introduced as well as the importance of paying attention to internal hunger signals. Triggers for non-hunger eating and techniques for dealing with triggers will be explored. The purpose of this lesson is to provide patients with the opportunity to review the basic principles of the [place], discuss the value of eating mindfully and how to measure internal cues of hunger and fullness using the Hunger Scale. Patients will also discuss reasons for non-hunger eating and learn strategies to use for controlling emotional eating.  Targeting Your Nutrition Priorities Clinical staff led group instruction and group discussion with PowerPoint presentation and patient guidebook. To enhance the learning environment the use of posters, models and videos may be added. Patients will learn how to determine their genetic susceptibility to disease by reviewing their family history. Patients will gain insight into the importance of diet as part of an overall healthy lifestyle in mitigating the impact of genetics and other environmental insults. The purpose of this lesson is to provide patients with the  opportunity to assess their personal nutrition priorities by looking at their family history, their own health history and current risk factors. Patients will also be able to discuss ways of prioritizing and modifying the [place] for their highest risk areas  Menu  Clinical staff led group instruction and group discussion with PowerPoint presentation and patient guidebook.  To enhance the learning environment the use of posters, models and videos may be added. Using menus brought in from [employer], or printed from [employer], patients will apply the [place] dining out guidelines that were presented in the [employer] video. Patients will also be able to practice these guidelines in a variety of provided scenarios. The purpose of this lesson is to provide patients with the opportunity to practice hands-on learning of the [place] with actual menus and practice scenarios.  Label Reading Clinical staff led group instruction and group discussion with PowerPoint presentation and patient guidebook. To enhance the learning environment the use of posters, models and videos may be added. Patients will review and discuss the Pritikin label reading guidelines presented in Pritikin's Label Reading Educational series video. Using fool labels brought in from local grocery stores and markets, patients will apply the label reading guidelines and determine if the packaged food meet the Pritikin guidelines. The purpose of this lesson is to provide patients with the opportunity to review, discuss, and practice hands-on learning of the Pritikin Label Reading guidelines with actual packaged food labels. [place] Workshops are designed to teach patients ways to prepare quick, simple, and affordable recipes at home. The importance of nutrition's role in chronic disease risk reduction is reflected in its emphasis in the overall  Pritikin program. By learning how to prepare essential core Pritikin Eating Plan recipes, patients will increase control over what they eat; be able to customize the flavor of foods without the use of added salt, sugar, or fat; and improve the quality of the food they consume. By learning a set of core recipes which are easily assembled, quickly prepared, and affordable, patients are more likely to prepare more healthy foods at home. These workshops focus on convenient breakfasts, simple entres, side dishes, and desserts which can be prepared with minimal effort and are consistent with nutrition recommendations for cardiovascular risk reduction. Cooking [employer] are taught by a Engineer, materials (RD) who has been trained by the [employer]. The chef or RD has a clear understanding of the importance of minimizing - if not completely eliminating - added fat, sugar, and sodium in recipes. Throughout the series of [place] Workshop sessions, patients will learn about healthy ingredients and efficient methods of cooking to build confidence in their capability to prepare    Cooking School weekly topics:  Adding Flavor- Sodium-Free  Fast and Healthy Breakfasts  Powerhouse Plant-Based Proteins  Satisfying Salads and Dressings  Simple Sides and Sauces  International Cuisine-Spotlight on the [place] Zones  Delicious Desserts  Savory Soups  Efficiency Cooking - Meals in a Snap  Tasty Appetizers and Snacks  Comforting Weekend Breakfasts  One-Pot Wonders   Fast Evening Meals  Easy [place] (Psychosocial): New Thoughts, New Behaviors Clinical staff led group instruction and group discussion with PowerPoint presentation and patient guidebook. To enhance the learning environment the use of posters, models and videos may be added. Patients will learn and practice techniques for developing effective health  and lifestyle goals. Patients will be able to effectively apply the goal setting process learned to develop at least one new personal goal.  The purpose of this lesson is to expose patients to a new skill set of behavior modification techniques such as techniques setting SMART goals, overcoming barriers, and achieving new thoughts and new behaviors.  Managing  Moods and Relationships Clinical staff led group instruction and group discussion with PowerPoint presentation and patient guidebook. To enhance the learning environment the use of posters, models and videos may be added. Patients will learn how emotional and chronic stress factors can impact their health and relationships. They will learn healthy ways to manage their moods and utilize positive coping mechanisms. In addition, ICR patients will learn ways to improve communication skills. The purpose of this lesson is to expose patients to ways of understanding how one's mood and health are intimately connected. Developing a healthy outlook can help build positive relationships and connections with others. Patients will understand the importance of utilizing effective communication skills that include actively listening and being heard. They will learn and understand the importance of the "4 Cs" and especially Connections in fostering of a Healthy Mind-Set.  Healthy Sleep for a Healthy Heart Clinical staff led group instruction and group discussion with PowerPoint presentation and patient guidebook. To enhance the learning environment the use of posters, models and videos may be added. At the conclusion of this workshop, patients will be able to demonstrate knowledge of the importance of sleep to overall health, well-being, and quality of life. They will understand the symptoms of, and treatments for, common sleep disorders. Patients will also be able to identify daytime and nighttime behaviors which impact sleep, and they will be able to apply these tools  to help manage sleep-related challenges. The purpose of this lesson is to provide patients with a general overview of sleep and outline the importance of quality sleep. Patients will learn about a few of the most common sleep disorders. Patients will also be introduced to the concept of "sleep hygiene," and discover ways to self-manage certain sleeping problems through simple daily behavior changes. Finally, the workshop will motivate patients by clarifying the links between quality sleep and their goals of heart-healthy living.   Recognizing and Reducing Stress Clinical staff led group instruction and group discussion with PowerPoint presentation and patient guidebook. To enhance the learning environment the use of posters, models and videos may be added. At the conclusion of this workshop, patients will be able to understand the types of stress reactions, differentiate between acute and chronic stress, and recognize the impact that chronic stress has on their health. They will also be able to apply different coping mechanisms, such as reframing negative self-talk. Patients will have the opportunity to practice a variety of stress management techniques, such as deep abdominal breathing, progressive muscle relaxation, and/or guided imagery.  The purpose of this lesson is to educate patients on the role of stress in their lives and to provide healthy techniques for coping with it.  Learning Barriers/Preferences:  Learning Barriers/Preferences - 06/22/22 1020       Learning Barriers/Preferences   Learning Barriers Exercise Concerns   Pt wears glasses, dizzy, orthostatic HTN   Learning Preferences Audio;Computer/Internet;Group Instruction;Individual Instruction;Pictoral;Skilled Demonstration;Verbal Instruction;Video;Written Material             Education Topics:  Knowledge Questionnaire Score:  Knowledge Questionnaire Score - 06/22/22 1021       Knowledge Questionnaire Score   Pre Score 21/24              Core Components/Risk Factors/Patient Goals at Admission:  Personal Goals and Risk Factors at Admission - 06/22/22 1023       Core Components/Risk Factors/Patient Goals on Admission    Weight Management Yes;Weight Loss    Intervention Weight Management: Develop a combined nutrition and exercise program  designed to reach desired caloric intake, while maintaining appropriate intake of nutrient and fiber, sodium and fats, and appropriate energy expenditure required for the weight goal.;Weight Management: Provide education and appropriate resources to help participant work on and attain dietary goals.    Admit Weight 181 lb 7 oz (82.3 kg)    Expected Outcomes Short Term: Continue to assess and modify interventions until short term weight is achieved;Long Term: Adherence to nutrition and physical activity/exercise program aimed toward attainment of established weight goal;Weight Loss: Understanding of general recommendations for a balanced deficit meal plan, which promotes 1-2 lb weight loss per week and includes a negative energy balance of [phone] kcal/d;Understanding recommendations for meals to include 15-35% energy as protein, 25-35% energy from fat, 35-60% energy from carbohydrates, less than '200mg'$  of dietary cholesterol, 20-35 gm of total fiber daily;Understanding of distribution of calorie intake throughout the day with the consumption of 4-5 meals/snacks    Hypertension --    Intervention --    Expected Outcomes --    Lipids Yes    Intervention Provide education and support for participant on nutrition & aerobic/resistive exercise along with prescribed medications to achieve LDL '70mg'$ , HDL >'40mg'$ .    Expected Outcomes Short Term: Participant states understanding of desired cholesterol values and is compliant with medications prescribed. Participant is following exercise prescription and nutrition guidelines.;Long Term: Cholesterol controlled with medications as prescribed, with  individualized exercise RX and with personalized nutrition plan. Value goals: LDL < '70mg'$ , HDL > 40 mg.    Stress Yes    Intervention Offer individual and/or small group education and counseling on adjustment to heart disease, stress management and health-related lifestyle change. Teach and support self-help strategies.;Refer participants experiencing significant psychosocial distress to appropriate mental health specialists for further evaluation and treatment. When possible, include family members and significant others in education/counseling sessions.    Expected Outcomes Short Term: Participant demonstrates changes in health-related behavior, relaxation and other stress management skills, ability to obtain effective social support, and compliance with psychotropic medications if prescribed.;Long Term: Emotional wellbeing is indicated by absence of clinically significant psychosocial distress or social isolation.    Personal Goal Other Yes    Personal Goal long and short the same: get back into exercise and feel healthier    Intervention Will continue to monitor pt and progress workloads as tolerated without sign or symptom    Expected Outcomes Pt will achieve her goals and gain strength             Core Components/Risk Factors/Patient Goals Review:    Core Components/Risk Factors/Patient Goals at Discharge (Final Review):    ITP Comments:  ITP Comments     Row Name 06/22/22 1454           ITP Comments Dr Fransico Him MD, Medical Director, Introduction to [facility]/Intensive Cardiac Rehab. Initial Orientation packet Reviewed with the patient                Comments:Participant attended orientation for the cardiac rehabilitation program on  06/22/2022  to perform initial intake and exercise walk test. Patient introduced to the [facility] education and orientation packet was reviewed. Completed 6-minute walk test, measurements, initial ITP, and exercise  prescription. Vital signs stable. Telemetry-normal sinus rhythm, PVC's frequent at times, non sustained trigeminy. [place] did report feeling dizzy and had orthostatic changes which she has a history of see previous documentation in epic. Symptoms improved with water.Harrell Gave RN BSN   Service time was from [phone]  to 1012.

## 2022-06-22 NOTE — Progress Notes (Signed)
Cardiac Rehab Medication Review by a Nurse  Does the patient  feel that his/her medications are working for him/her?  yes  Has the patient been experiencing any side effects to the medications prescribed?  no  Does the patient measure his/her own blood pressure or blood glucose at home?  no   Does the patient have any problems obtaining medications due to transportation or finances?   no  Understanding of regimen: fair Understanding of indications: fair Potential of compliance: good   Nurse  comments: Rachel Clark is taking her medications as prescribed and has a fair understanding of what her medications are for. Rachel Clark has a BP cuff she does not check her blood pressures on a regular basis.    Christa See Rachel Fiscus RN 06/22/2022 2:59 PM

## 2022-06-23 ENCOUNTER — Telehealth (HOSPITAL_COMMUNITY): Payer: Self-pay | Admitting: *Deleted

## 2022-06-23 NOTE — Telephone Encounter (Signed)
-----   Message from Peter M [name], MD sent at 06/23/2022  7:22 AM EST ----- She has known orthostasis. Manage conservatively. Proceed with Rehab.  Peter [name] MD, [place]   ----- Message ----- From: Magda Kiel, RN Sent: 06/22/2022   4:12 PM EST To: Peter M [name], MD  Dr [name], Yudit had orthostatic changes this morning and complaints of dizziness at cardiac rehab orientation she completed her 6 minute nustep. Symptoms resolved after water. She said she will bring Gatorade when she starts exercise next Friday. Intermittent frequent PVC's trigreminey non sustained noted she has a history of this please see attached media from today. Thank you!

## 2022-06-30 ENCOUNTER — Encounter (HOSPITAL_COMMUNITY)
Admission: RE | Admit: 2022-06-30 | Discharge: 2022-06-30 | Disposition: A | Discharge status: Home / Self Care | Payer: Medicare Other | Source: Ambulatory Visit | Attending: Cardiology | Admitting: Cardiology

## 2022-06-30 VITALS — BP 122/70 | HR 81 | Wt 178.6 lb

## 2022-06-30 DIAGNOSIS — I252 Old myocardial infarction: Secondary | ICD-10-CM | POA: Diagnosis not present

## 2022-06-30 DIAGNOSIS — Z955 Presence of coronary angioplasty implant and graft: Secondary | ICD-10-CM

## 2022-06-30 DIAGNOSIS — Z48812 Encounter for surgical aftercare following surgery on the circulatory system: Secondary | ICD-10-CM | POA: Diagnosis not present

## 2022-06-30 DIAGNOSIS — I214 Non-ST elevation (NSTEMI) myocardial infarction: Secondary | ICD-10-CM

## 2022-06-30 NOTE — Progress Notes (Signed)
Daily Session Note  Patient Details  Name: Rachel Clark MRN: [id number] Date of Birth: 03-Jun-1951 Referring Provider:   Flowsheet Row INTENSIVE CARDIAC REHAB ORIENT from 06/22/2022 in [facility]  Referring Provider Dr. Peter [name], MD       Encounter Date: 06/30/2022  Check In:  Session Check In - 06/30/22 1050       Check-In   Supervising physician immediately available to respond to emergencies [facility] - Physician supervision    Physician(s) Dr. Audie Box    Location [facility]    Staff Present Barnet Pall, RN, BSN;Jetta Walker BS, ACSM-CEP, Exercise Physiologist;David Highland, MS, ACSM-CEP, CCRP, Exercise Physiologist;Olinty Celesta Aver, MS, ACSM-CEP, Exercise Physiologist;Randi Olen Cordial BS, ACSM-CEP, Exercise Physiologist    Virtual Visit No    Medication changes reported     No    Fall or balance concerns reported    No    Tobacco Cessation No Change    Current number of cigarettes/nicotine per day     0    Warm-up and Cool-down Performed as group-led instruction    Resistance Training Performed Yes    VAD Patient? No    PAD/SET Patient? No      Pain Assessment   Currently in Pain? No/denies    Pain Score 0-No pain    Multiple Pain Sites No             Capillary Blood Glucose: No results found for this or any previous visit (from the past 24 hour(s)).   Exercise Prescription Changes - 06/30/22 1016       Response to Exercise   Blood Pressure (Admit) 122/70    Blood Pressure (Exercise) 126/68    Blood Pressure (Exit) 116/64    Heart Rate (Admit) 81 bpm    Heart Rate (Exercise) 113 bpm    Heart Rate (Exit) 90 bpm    Rating of Perceived Exertion (Exercise) 13    Symptoms None    Comments Off to a good start with exercise.    Duration Progress to 30 minutes of  aerobic without signs/symptoms of physical distress    Intensity THRR unchanged      Progression   Progression  Continue to progress workloads to maintain intensity without signs/symptoms of physical distress.    Average METs 1.9      Resistance Training   Training Prescription Yes    Weight 2 lbs wts    Reps 10-15    Time 10 Minutes      Interval Training   Interval Training No      NuStep   Level 1    SPM 70    Minutes 25    METs 1.9             Social History   Tobacco Use  Smoking Status Former   Packs/day: 0.30   Years: 20.00   Total pack years: 6.00   Types: Cigarettes   Quit date: 06/06/1981   Years since quitting: 41.0  Smokeless Tobacco Never    Goals Met:  Exercise tolerated well No report of concerns or symptoms today Strength training completed today  Goals Unmet:  Not Applicable  Comments: Pt started cardiac rehab today.  Pt tolerated light exercise without difficulty. VSS, telemetry-Sinus Rhythm with occasional PVC, asymptomatic.  Medication list reconciled. Pt denies barriers to medicaiton compliance.  PSYCHOSOCIAL ASSESSMENT:  PHQ-0. Pt exhibits positive coping skills, hopeful outlook with supportive family. No psychosocial needs  identified at this time, no psychosocial interventions necessary .  Pt oriented to exercise equipment and routine.    Understanding verbalized. Harrell Gave RN BSN    Dr. Fransico Him is Medical Director for Cardiac Rehab at [facility] [address].

## 2022-07-04 NOTE — Progress Notes (Signed)
Cardiac Individual Treatment Plan  Patient Details  Name: Rachel Clark MRN: [id number] Date of Birth: 1950/09/02 Referring Provider:   Flowsheet Row INTENSIVE CARDIAC REHAB ORIENT from 06/22/2022 in [name] [place]  Referring Provider Dr. Peter [name], MD       Initial Encounter Date:  [place] from 06/22/2022 in [name], Vascular, & [facility]  Date 06/22/22       Visit Diagnosis: 11/10/21 NSTEMI  11/11/21 S/P DES Ramus  03/08/21 S/P DES x 3 RAMUS  Patient's Home Medications on Admission:  Current Outpatient Medications:    acetaminophen (TYLENOL) 500 MG tablet, Take 500-1,000 mg by mouth daily as needed for mild pain or headache., Disp: , Rfl:    aspirin 81 MG chewable tablet, Chew 1 tablet (81 mg total) by mouth daily., Disp: , Rfl:    Azelastine HCl 0.15 % SOLN, Place 1 spray into both nostrils daily as needed (seasonal allergies)., Disp: , Rfl: 12   Bempedoic Acid-Ezetimibe (NEXLIZET) 180-10 MG TABS, Take 180 mg by mouth daily., Disp: 90 tablet, Rfl: 3   calcium carbonate (TUMS - DOSED IN MG ELEMENTAL CALCIUM) 500 MG chewable tablet, Chew 500 mg by mouth daily., Disp: , Rfl:    carbamazepine (TEGRETOL XR) 200 MG 12 hr tablet, Take 3 tablets (600 mg total) by mouth 2 (two) times daily., Disp: 540 tablet, Rfl: 4   Cholecalciferol (VITAMIN D) 50 MCG (2000 UT) CAPS, Take 4,000 Units by mouth daily., Disp: , Rfl:    diclofenac Sodium (VOLTAREN) 1 % GEL, Apply 2 g topically 4 (four) times daily. (Patient not taking: Reported on 06/21/2022), Disp: 2 g, Rfl: 0   diphenhydrAMINE (BENADRYL) 25 MG tablet, Take 25 mg by mouth daily., Disp: , Rfl:    diphenhydramine-acetaminophen (TYLENOL PM) 25-500 MG TABS tablet, Take 1 tablet by mouth at bedtime., Disp: , Rfl:    DULoxetine (CYMBALTA) 60 MG capsule, Take 60 mg by mouth 2 (two) times daily., Disp: , Rfl:     ethosuximide (ZARONTIN) 250 MG capsule, Take 2 capsules (500 mg total) by mouth 2 (two) times daily., Disp: 360 capsule, Rfl: 4   Evolocumab with Infusor ([name]) 420 MG/3.5ML SOCT, Inject 420 mg into the skin every 30 (thirty) days., Disp: 3.6 mL, Rfl: 11   fluticasone (FLONASE) 50 MCG/ACT nasal spray, Place 1 spray into both nostrils daily as needed for allergies., Disp: , Rfl:    isosorbide mononitrate (IMDUR) 30 MG 24 hr tablet, TAKE HALF TABLET ('15MG'$  TOTAL) BY MOUTH EVERY DAY, Disp: 30 tablet, Rfl: 3   levothyroxine (SYNTHROID, LEVOTHROID) 50 MCG tablet, Take 50 mcg by mouth daily before breakfast., Disp: , Rfl:    memantine (NAMENDA) 10 MG tablet, Take 1 tablet (10 mg total) by mouth 2 (two) times daily., Disp: 180 tablet, Rfl: 4   nitroGLYCERIN (NITROSTAT) 0.4 MG SL tablet, Place 1 tablet (0.4 mg total) under the tongue every 5 (five) minutes as needed. For chest pain., Disp: 25 tablet, Rfl: 11   pantoprazole (PROTONIX) 40 MG tablet, TAKE ONE TABLET BY MOUTH TWICE DAILY FOR 30 DAYS THEN TAKE ONE TABLET BY MOUTH DAILY, Disp: 120 tablet, Rfl: 2   prasugrel (EFFIENT) 10 MG TABS tablet, Take 1 tablet (10 mg total) by mouth daily., Disp: 90 tablet, Rfl: 3  Past Medical History: Past Medical History:  Diagnosis Date   Arthritis    "fingers" (06/29/2016)  Chronic lower back pain    Colon cancer ([place]) 12/04/2011   s/p Laparoscopic-assisted transverse colectomy on 12/19/2011 by Dr. Donne Hazel.  pT3 N0 M0.    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   last seizure was in 1972 ;takes Tegretol (06/29/2016)    Headache    "weekly" (06/29/2016)   Hyperlipidemia    Hyponatremia    Hypothyroid    Iron deficiency anemia 11/17/2011   Memory change 11/13/2016   Monoclonal gammopathy of unknown  significance    Orthostatic hypotension    Presence of permanent cardiac pacemaker    Syncope and collapse    pacemaker implanted    Tobacco Use: Social History   Tobacco Use  Smoking Status Former   Packs/day: 0.30   Years: 20.00   Total pack years: 6.00   Types: Cigarettes   Quit date: 06/06/1981   Years since quitting: 41.1  Smokeless Tobacco Never    Labs: Review Flowsheet  More data exists      Latest Ref Rng & Units 03/08/2021 03/09/2021 03/13/2021 10/19/2021 03/27/2022  Labs for ITP Cardiac and Pulmonary Rehab  Cholestrol 100 - 199 mg/dL - 157  - 236  172   LDL (calc) 0 - 99 mg/dL - 72  - 126  60   HDL-C >39 mg/dL - 68  - 87  95   Trlycerides 0 - 149 mg/dL - 86  - 135  99   PH, Arterial 7.350 - 7.450 7.213  - - - -  PCO2 arterial 32.0 - 48.0 mmHg 46.1  - - - -  Bicarbonate 20.0 - 28.0 mmol/L 18.5  - - - -  TCO2 22 - 32 mmol/L 20  - 24  - -  Acid-base deficit 0.0 - 2.0 mmol/L 9.0  - - - -  O2 Saturation % 97.0  - - - -    Capillary Blood Glucose: Lab Results  Component Value Date   GLUCAP 140 (H) 07/27/2015     Exercise Target Goals: Exercise Program Goal: Individual exercise prescription set using results from initial 6 min walk test and THRR while considering  patient's activity barriers and safety.   Exercise Prescription Goal: Initial exercise prescription builds to 30-45 minutes a day of aerobic activity, 2-3 days per week.  Home exercise guidelines will be given to patient during program as part of exercise prescription that the participant will acknowledge.  Activity Barriers & Risk Stratification:  Activity Barriers & Cardiac Risk Stratification - 06/22/22 1010       Activity Barriers & Cardiac Risk Stratification   Activity Barriers Arthritis;Deconditioning;Chest Pain/Angina;Balance Concerns    Cardiac Risk Stratification High             6 Minute Walk:  6 Minute Walk     Row Name 06/22/22 1003         6 Minute Walk   Phase Initial      Distance 1214 feet     Walk Time 6 minutes     # of Rest Breaks 0     MPH 2.3     METS 2.8     RPE 9     Perceived Dyspnea  0     VO2 Peak 9.81     Symptoms Yes (  comment)     Comments Pre dizzy orthostatic: 122/64 sit, 98/70 stand. Pre recheck after H2O 106/62 sit, 94/70 stand     Resting HR 92 bpm     Resting BP 122/64     Resting Oxygen Saturation  98 %     Exercise Oxygen Saturation  during 6 min walk 97 %     Max Ex. HR 110 bpm     Max Ex. BP 148/70     2 Minute Post BP 120/66              Oxygen Initial Assessment:   Oxygen Re-Evaluation:   Oxygen Discharge (Final Oxygen Re-Evaluation):   Initial Exercise Prescription:  Initial Exercise Prescription - 06/22/22 1000       Date of Initial Exercise RX and Referring Provider   Date 06/22/22    Referring Provider Dr. Peter [name], MD    Expected Discharge Date 08/18/22      NuStep   Level 1    SPM 75    Minutes 25    METs 1.8      Prescription Details   Frequency (times per week) 3    Duration Progress to 30 minutes of continuous aerobic without signs/symptoms of physical distress      Intensity   THRR 40-80% of Max Heartrate 59-118    Ratings of Perceived Exertion 11-13    Perceived Dyspnea 0-4      Progression   Progression Continue progressive overload as per policy without signs/symptoms or physical distress.      Resistance Training   Training Prescription Yes    Weight 2 lbs wts    Reps 10-15             Perform Capillary Blood Glucose checks as needed.  Exercise Prescription Changes:   Exercise Prescription Changes     Row Name 06/30/22 1016             Response to Exercise   Blood Pressure (Admit) 122/70       Blood Pressure (Exercise) 126/68       Blood Pressure (Exit) 116/64       Heart Rate (Admit) 81 bpm       Heart Rate (Exercise) 113 bpm       Heart Rate (Exit) 90 bpm       Rating of Perceived Exertion (Exercise) 13       Symptoms None       Comments Off to a  good start with exercise.       Duration Progress to 30 minutes of  aerobic without signs/symptoms of physical distress       Intensity THRR unchanged         Progression   Progression Continue to progress workloads to maintain intensity without signs/symptoms of physical distress.       Average METs 1.9         Resistance Training   Training Prescription Yes       Weight 2 lbs wts       Reps 10-15       Time 10 Minutes         Interval Training   Interval Training No         NuStep   Level 1       SPM 70       Minutes 25       METs 1.9  Exercise Comments:   Exercise Comments     Row Name 06/30/22 1125           Exercise Comments Patient tolerated low intensity exercise well without symptoms. Oriented to warm-up, cool-down, and weight training exercises safely.                Exercise Goals and Review:   Exercise Goals     Row Name 06/22/22 1015             Exercise Goals   Increase Physical Activity Yes       Intervention Provide advice, education, support and counseling about physical activity/exercise needs.;Develop an individualized exercise prescription for aerobic and resistive training based on initial evaluation findings, risk stratification, comorbidities and participant's personal goals.       Expected Outcomes Short Term: Attend rehab on a regular basis to increase amount of physical activity.;Long Term: Add in home exercise to make exercise part of routine and to increase amount of physical activity.;Long Term: Exercising regularly at least 3-5 days a week.       Increase Strength and Stamina Yes       Intervention Provide advice, education, support and counseling about physical activity/exercise needs.;Develop an individualized exercise prescription for aerobic and resistive training based on initial evaluation findings, risk stratification, comorbidities and participant's personal goals.       Expected Outcomes Short Term:  Increase workloads from initial exercise prescription for resistance, speed, and METs.;Short Term: Perform resistance training exercises routinely during rehab and add in resistance training at home;Long Term: Improve cardiorespiratory fitness, muscular endurance and strength as measured by increased METs and functional capacity (6MWT)       Able to understand and use rate of perceived exertion (RPE) scale Yes       Intervention Provide education and explanation on how to use RPE scale       Expected Outcomes Short Term: Able to use RPE daily in rehab to express subjective intensity level;Long Term:  Able to use RPE to guide intensity level when exercising independently       Knowledge and understanding of Target Heart Rate Range (THRR) Yes       Intervention Provide education and explanation of THRR including how the numbers were predicted and where they are located for reference       Expected Outcomes Short Term: Able to state/look up THRR;Long Term: Able to use THRR to govern intensity when exercising independently;Short Term: Able to use daily as guideline for intensity in rehab       Understanding of Exercise Prescription Yes       Intervention Provide education, explanation, and written materials on patient's individual exercise prescription       Expected Outcomes Short Term: Able to explain program exercise prescription;Long Term: Able to explain home exercise prescription to exercise independently                Exercise Goals Re-Evaluation :  Exercise Goals Re-Evaluation     Row Name 06/30/22 1125             Exercise Goal Re-Evaluation   Exercise Goals Review Increase Physical Activity;Increase Strength and Stamina;Able to understand and use rate of perceived exertion (RPE) scale       Comments Patient able to undertand and use RPE scale appropriately.       Expected Outcomes Progress workloads as tolerated to help increase strength and stamina.  Discharge Exercise Prescription (Final Exercise Prescription Changes):  Exercise Prescription Changes - 06/30/22 1016       Response to Exercise   Blood Pressure (Admit) 122/70    Blood Pressure (Exercise) 126/68    Blood Pressure (Exit) 116/64    Heart Rate (Admit) 81 bpm    Heart Rate (Exercise) 113 bpm    Heart Rate (Exit) 90 bpm    Rating of Perceived Exertion (Exercise) 13    Symptoms None    Comments Off to a good start with exercise.    Duration Progress to 30 minutes of  aerobic without signs/symptoms of physical distress    Intensity THRR unchanged      Progression   Progression Continue to progress workloads to maintain intensity without signs/symptoms of physical distress.    Average METs 1.9      Resistance Training   Training Prescription Yes    Weight 2 lbs wts    Reps 10-15    Time 10 Minutes      Interval Training   Interval Training No      NuStep   Level 1    SPM 70    Minutes 25    METs 1.9             Nutrition:  Target Goals: Understanding of nutrition guidelines, daily intake of sodium '1500mg'$ , cholesterol '200mg'$ , calories 30% from fat and 7% or less from saturated fats, daily to have 5 or more servings of fruits and vegetables.  Biometrics:  Pre Biometrics - 06/22/22 1002       Pre Biometrics   Waist Circumference 41.5 inches    Hip Circumference 43.5 inches    Waist to Hip Ratio 0.95 %    Triceps Skinfold 21 mm    % Body Fat 39.8 %    Grip Strength 23 kg    Flexibility 12.25 in    Single Leg Stand 6 seconds              Nutrition Therapy Plan and Nutrition Goals:   Nutrition Assessments:  Nutrition Assessments - 06/22/22 1023       MEDFICTS Scores   Pre Score 47            MEDIFICTS Score Key: ?70 Need to make dietary changes  40-70 Heart Healthy Diet ? 40 Therapeutic Level Cholesterol Diet   Flowsheet Row INTENSIVE CARDIAC REHAB ORIENT from 06/22/2022 in [facility] Heart,  Vascular, & Lung Health  Picture Your Plate Total Score on Admission 47      Picture Your Plate Scores: <56 Unhealthy dietary pattern with much room for improvement. 41-50 Dietary pattern unlikely to meet recommendations for good health and room for improvement. 51-60 More healthful dietary pattern, with some room for improvement.  >60 Healthy dietary pattern, although there may be some specific behaviors that could be improved.    Nutrition Goals Re-Evaluation:   Nutrition Goals Re-Evaluation:   Nutrition Goals Discharge (Final Nutrition Goals Re-Evaluation):   Psychosocial: Target Goals: Acknowledge presence or absence of significant depression and/or stress, maximize coping skills, provide positive support system. Participant is able to verbalize types and ability to use techniques and skills needed for reducing stress and depression.  Initial Review & Psychosocial Screening:  Initial Psych Review & Screening - 06/22/22 1456       Initial Review   Current issues with Current Stress Concerns;History of Depression    Source of Stress Concerns Chronic Illness    Comments  Rachel Clark denies having any depression has health stressor regarding CAD and issues with orthostastic hypotension. Rachel Clark is on an antidepressant which currently controls her depression.      Family Dynamics   Good Support System? Yes   Rachel Clark has her husband Rachel Clark for support     Barriers   Psychosocial barriers to participate in program The patient should benefit from training in stress management and relaxation.      Screening Interventions   Interventions Encouraged to exercise    Expected Outcomes Long Term Goal: Stressors or current issues are controlled or eliminated.             Quality of Life Scores:  Quality of Life - 06/22/22 1020       Quality of Life   Select Quality of Life      Quality of Life Scores   Health/Function Pre 24.6 %    Socioeconomic Pre 28.5 %    Psych/Spiritual  Pre 21.43 %    Family Pre 25.4 %    GLOBAL Pre 24.52 %            Scores of 19 and below usually indicate a poorer quality of life in these areas.  A difference of  2-3 points is a clinically meaningful difference.  A difference of 2-3 points in the total score of the Quality of Life Index has been associated with significant improvement in overall quality of life, self-image, physical symptoms, and general health in studies assessing change in quality of life.  PHQ-9: Review Flowsheet       06/22/2022 08/30/2021 06/14/2021  Depression screen PHQ 2/9  Decreased Interest 0 0 1  Down, Depressed, Hopeless 0 1 0  PHQ - 2 Score 0 1 1  Altered sleeping - - 0  Tired, decreased energy - - 3  Change in appetite - - 0  Feeling bad or failure about yourself  - - 0  Trouble concentrating - - 1  Moving slowly or fidgety/restless - - 0  Suicidal thoughts - - 0  PHQ-9 Score - - 5  Difficult doing work/chores - - Not difficult at all   Interpretation of Total Score  Total Score Depression Severity:  1-4 = Minimal depression, 5-9 = Mild depression, 10-14 = Moderate depression, 15-19 = Moderately severe depression, 20-27 = Severe depression   Psychosocial Evaluation and Intervention:   Psychosocial Re-Evaluation:  Psychosocial Re-Evaluation     [place] Name 06/30/22 1508             Psychosocial Re-Evaluation   Current issues with Current Stress Concerns;History of Depression       Comments Rachel Clark did not voice any increased concerns or stressors on her first day of exercise.       Expected Outcomes Rachel Clark will have controlled or decreased stressors or depression upon completion of intensive cardiac rehab       Interventions Stress management education;Encouraged to attend Cardiac Rehabilitation for the exercise;Relaxation education       Continue Psychosocial Services  No Follow up required         Initial Review   Source of Stress Concerns Unable to participate in former interests  or hobbies;Unable to perform yard/household activities;Chronic Illness       Comments Will continue to monitor and offer support as needed                Psychosocial Discharge (Final Psychosocial Re-Evaluation):  Psychosocial Re-Evaluation - 06/30/22 6283  Psychosocial Re-Evaluation   Current issues with Current Stress Concerns;History of Depression    Comments [place] did not voice any increased concerns or stressors on her first day of exercise.    Expected Outcomes Rachel Clark will have controlled or decreased stressors or depression upon completion of intensive cardiac rehab    Interventions Stress management education;Encouraged to attend Cardiac Rehabilitation for the exercise;Relaxation education    Continue Psychosocial Services  No Follow up required      Initial Review   Source of Stress Concerns Unable to participate in former interests or hobbies;Unable to perform yard/household activities;Chronic Illness    Comments Will continue to monitor and offer support as needed             Vocational Rehabilitation: Provide vocational rehab assistance to qualifying candidates.   Vocational Rehab Evaluation & Intervention:  Vocational Rehab - 06/22/22 1458       Initial Vocational Rehab Evaluation & Intervention   Assessment shows need for Vocational Rehabilitation No   Rachel Clark is retired and does not need vocational rehab at this time            Education: Education Goals: Education classes will be provided on a weekly basis, covering required topics. Participant will state understanding/return demonstration of topics presented.    Education     Row Name 06/30/22 1200     Education   Cardiac Education Topics Pritikin   Architect Education   General Education Heart Disease Risk Reduction   Instruction Review Code 1- Verbalizes Understanding   Class Start Time 1132   Class Stop  Time 1205   Class Time Calculation (min) 33 min            Core Videos: Exercise    Move It!  Clinical staff conducted group or individual video education with verbal and written material and guidebook.  Patient learns the recommended Pritikin exercise program. Exercise with the goal of living a long, healthy life. Some of the health benefits of exercise include controlled diabetes, healthier blood pressure levels, improved cholesterol levels, improved heart and lung capacity, improved sleep, and better body composition. Everyone should speak with their doctor before starting or changing an exercise routine.  Biomechanical Limitations Clinical staff conducted group or individual video education with verbal and written material and guidebook.  Patient learns how biomechanical limitations can impact exercise and how we can mitigate and possibly overcome limitations to have an impactful and balanced exercise routine.  Body Composition Clinical staff conducted group or individual video education with verbal and written material and guidebook.  Patient learns that body composition (ratio of muscle mass to fat mass) is a key component to assessing overall fitness, rather than body weight alone. Increased fat mass, especially visceral belly fat, can put [place] at increased risk for metabolic syndrome, type 2 diabetes, heart disease, and even death. It is recommended to combine diet and exercise (cardiovascular and resistance training) to improve your body composition. Seek guidance from your physician and exercise physiologist before implementing an exercise routine.  Exercise Action Plan Clinical staff conducted group or individual video education with verbal and written material and guidebook.  Patient learns the recommended strategies to achieve and enjoy long-term exercise adherence, including variety, self-motivation, self-efficacy, and positive decision making. Benefits of exercise include  fitness, good health, weight management, more energy, better sleep, less stress, and overall well-being.  Medical   Heart  Disease Risk Reduction Clinical staff conducted group or individual video education with verbal and written material and guidebook.  Patient learns our heart is our most vital organ as it circulates oxygen, nutrients, white blood cells, and hormones throughout the entire body, and carries waste away. Data supports a plant-based eating plan like the Pritikin Program for its effectiveness in slowing progression of and reversing heart disease. The video provides a number of recommendations to address heart disease.   Metabolic Syndrome and Belly Fat  Clinical staff conducted group or individual video education with verbal and written material and guidebook.  Patient learns what metabolic syndrome is, how it leads to heart disease, and how one can reverse it and keep it from coming back. You have metabolic syndrome if you have 3 of the following 5 criteria: abdominal obesity, high blood pressure, high triglycerides, low HDL cholesterol, and high blood sugar.  Hypertension and Heart Disease Clinical staff conducted group or individual video education with verbal and written material and guidebook.  Patient learns that high blood pressure, or hypertension, is very common in the [place]. Hypertension is largely due to excessive salt intake, but other important risk factors include being overweight, physical inactivity, drinking too much alcohol, smoking, and not eating enough potassium from fruits and vegetables. High blood pressure is a leading risk factor for heart attack, stroke, congestive heart failure, dementia, kidney failure, and premature death. Long-term effects of excessive salt intake include stiffening of the arteries and thickening of heart muscle and organ damage. Recommendations include ways to reduce hypertension and the risk of heart disease.  Diseases of Our Time  - Focusing on Diabetes Clinical staff conducted group or individual video education with verbal and written material and guidebook.  Patient learns why the best way to stop diseases of our time is prevention, through food and other lifestyle changes. Medicine (such as prescription pills and surgeries) is often only a Band-Aid on the problem, not a long-term solution. Most common diseases of our time include obesity, type 2 diabetes, hypertension, heart disease, and cancer. The Pritikin Program is recommended and has been proven to help reduce, reverse, and/or prevent the damaging effects of metabolic syndrome.  Nutrition   Overview of the Pritikin Eating Plan  Clinical staff conducted group or individual video education with verbal and written material and guidebook.  Patient learns about the [place] for disease risk reduction. The [place] emphasizes a wide variety of unrefined, minimally-processed carbohydrates, like fruits, vegetables, whole grains, and legumes. Go, Caution, and Stop food choices are explained. Plant-based and lean animal proteins are emphasized. Rationale provided for low sodium intake for blood pressure control, low added sugars for blood sugar stabilization, and low added fats and oils for coronary artery disease risk reduction and weight management.  Calorie Density  Clinical staff conducted group or individual video education with verbal and written material and guidebook.  Patient learns about calorie density and how it impacts the Pritikin Eating Plan. Knowing the characteristics of the food you choose will help you decide whether those foods will lead to weight gain or weight loss, and whether you want to consume more or less of them. Weight loss is usually a side effect of the Pritikin Eating Plan because of its focus on low calorie-dense foods.  Label Reading  Clinical staff conducted group or individual video education with verbal and written  material and guidebook.  Patient learns about the Pritikin recommended label reading guidelines and corresponding recommendations regarding  calorie density, added sugars, sodium content, and whole grains.  Dining Out - Part 1  Clinical staff conducted group or individual video education with verbal and written material and guidebook.  Patient learns that restaurant meals can be sabotaging because they can be so high in calories, fat, sodium, and/or sugar. Patient learns recommended strategies on how to positively address this and avoid unhealthy pitfalls.  Facts on Fats  Clinical staff conducted group or individual video education with verbal and written material and guidebook.  Patient learns that lifestyle modifications can be just as effective, if not more so, as many medications for lowering your risk of heart disease. A Pritikin lifestyle can help to reduce your risk of inflammation and atherosclerosis (cholesterol build-up, or plaque, in the artery walls). Lifestyle interventions such as dietary choices and physical activity address the cause of atherosclerosis. A review of the types of fats and their impact on blood cholesterol levels, along with dietary recommendations to reduce fat intake is also included.  Nutrition Action Plan  Clinical staff conducted group or individual video education with verbal and written material and guidebook.  Patient learns how to incorporate Pritikin recommendations into their lifestyle. Recommendations include planning and keeping personal health goals in mind as an important part of their success.  Healthy Mind-Set    Healthy Minds, Bodies, Hearts  Clinical staff conducted group or individual video education with verbal and written material and guidebook.  Patient learns how to identify when they are stressed. Video will discuss the impact of that stress, as well as the many benefits of stress management. Patient will also be introduced to stress management  techniques. The way we think, act, and feel has an impact on our hearts.  How Our Thoughts Can Heal Our Hearts  Clinical staff conducted group or individual video education with verbal and written material and guidebook.  Patient learns that negative thoughts can cause depression and anxiety. This can result in negative lifestyle behavior and serious health problems. Cognitive behavioral therapy is an effective method to help control our thoughts in order to change and improve our emotional outlook.  Additional Videos:  Exercise    Improving Performance  Clinical staff conducted group or individual video education with verbal and written material and guidebook.  Patient learns to use a non-linear approach by alternating intensity levels and lengths of time spent exercising to help burn more calories and lose more body fat. Cardiovascular exercise helps improve heart health, metabolism, hormonal balance, blood sugar control, and recovery from fatigue. Resistance training improves strength, endurance, balance, coordination, reaction time, metabolism, and muscle mass. Flexibility exercise improves circulation, posture, and balance. Seek guidance from your physician and exercise physiologist before implementing an exercise routine and learn your capabilities and proper form for all exercise.  Introduction to Yoga  Clinical staff conducted group or individual video education with verbal and written material and guidebook.  Patient learns about yoga, a discipline of the coming together of mind, breath, and body. The benefits of yoga include improved flexibility, improved range of motion, better posture and core strength, increased lung function, weight loss, and positive self-image. Yoga's heart health benefits include lowered blood pressure, healthier heart rate, decreased cholesterol and triglyceride levels, improved immune function, and reduced stress. Seek guidance from your physician and exercise  physiologist before implementing an exercise routine and learn your capabilities and proper form for all exercise.  Medical   Aging: Enhancing Your Quality of Life  Clinical staff conducted group or individual video education  with verbal and written material and guidebook.  Patient learns key strategies and recommendations to stay in good physical health and enhance quality of life, such as prevention strategies, having an advocate, securing a [place], and keeping a list of medications and system for tracking them. It also discusses how to avoid risk for bone loss.  Biology of Weight Control  Clinical staff conducted group or individual video education with verbal and written material and guidebook.  Patient learns that weight gain occurs because we consume more calories than we burn (eating more, moving less). Even if your body weight is normal, you may have higher ratios of fat compared to muscle mass. Too much body fat puts you at increased risk for cardiovascular disease, heart attack, stroke, type 2 diabetes, and obesity-related cancers. In addition to exercise, following the [place] can help reduce your risk.  Decoding Lab Results  Clinical staff conducted group or individual video education with verbal and written material and guidebook.  Patient learns that lab test reflects one measurement whose values change over time and are influenced by many factors, including medication, stress, sleep, exercise, food, hydration, pre-existing medical conditions, and more. It is recommended to use the knowledge from this video to become more involved with your lab results and evaluate your numbers to speak with your doctor.   Diseases of Our Time - Overview  Clinical staff conducted group or individual video education with verbal and written material and guidebook.  Patient learns that according to the CDC, 50% to 70% of chronic diseases (such as obesity,  type 2 diabetes, elevated lipids, hypertension, and heart disease) are avoidable through lifestyle improvements including healthier food choices, listening to satiety cues, and increased physical activity.  Sleep Disorders Clinical staff conducted group or individual video education with verbal and written material and guidebook.  Patient learns how good quality and duration of sleep are important to overall health and well-being. Patient also learns about sleep disorders and how they impact health along with recommendations to address them, including discussing with a physician.  Nutrition  Dining Out - Part 2 Clinical staff conducted group or individual video education with verbal and written material and guidebook.  Patient learns how to plan ahead and communicate in order to maximize their dining experience in a healthy and nutritious manner. Included are recommended food choices based on the type of restaurant the patient is visiting.   Fueling a Best boy conducted group or individual video education with verbal and written material and guidebook.  There is a strong connection between our food choices and our health. Diseases like obesity and type 2 diabetes are very prevalent and are in large-part due to lifestyle choices. The Pritikin Eating Plan provides plenty of food and hunger-curbing satisfaction. It is easy to follow, affordable, and helps reduce health risks.  Menu Workshop  Clinical staff conducted group or individual video education with verbal and written material and guidebook.  Patient learns that restaurant meals can sabotage health goals because they are often packed with calories, fat, sodium, and sugar. Recommendations include strategies to plan ahead and to communicate with the manager, chef, or server to help order a healthier meal.  Planning Your Eating Strategy  Clinical staff conducted group or individual video education with verbal and written  material and guidebook.  Patient learns about the [place] and its benefit of reducing the risk of disease. The Pritikin Eating Plan does not  focus on calories. Instead, it emphasizes high-quality, nutrient-rich foods. By knowing the characteristics of the foods, we choose, we can determine their calorie density and make informed decisions.  Targeting Your Nutrition Priorities  Clinical staff conducted group or individual video education with verbal and written material and guidebook.  Patient learns that lifestyle habits have a tremendous impact on disease risk and progression. This video provides eating and physical activity recommendations based on your personal health goals, such as reducing LDL cholesterol, losing weight, preventing or controlling type 2 diabetes, and reducing high blood pressure.  Vitamins and Minerals  Clinical staff conducted group or individual video education with verbal and written material and guidebook.  Patient learns different ways to obtain key vitamins and minerals, including through a recommended healthy diet. It is important to discuss all supplements you take with your doctor.   Healthy Mind-Set    Smoking Cessation  Clinical staff conducted group or individual video education with verbal and written material and guidebook.  Patient learns that cigarette smoking and tobacco addiction pose a serious health risk which affects millions of people. Stopping smoking will significantly reduce the risk of heart disease, lung disease, and many forms of cancer. Recommended strategies for quitting are covered, including working with your doctor to develop a successful plan.  Culinary   Becoming a Financial trader conducted group or individual video education with verbal and written material and guidebook.  Patient learns that cooking at home can be healthy, cost-effective, quick, and puts them in control. Keys to cooking healthy recipes will  include looking at your recipe, assessing your equipment needs, planning ahead, making it simple, choosing cost-effective seasonal ingredients, and limiting the use of added fats, salts, and sugars.  Cooking - Breakfast and Snacks  Clinical staff conducted group or individual video education with verbal and written material and guidebook.  Patient learns how important breakfast is to satiety and nutrition through the entire day. Recommendations include key foods to eat during breakfast to help stabilize blood sugar levels and to prevent overeating at meals later in the day. Planning ahead is also a key component.  Cooking - Human resources officer conducted group or individual video education with verbal and written material and guidebook.  Patient learns eating strategies to improve overall health, including an approach to cook more at home. Recommendations include thinking of animal protein as a side on your plate rather than center stage and focusing instead on lower calorie dense options like vegetables, fruits, whole grains, and plant-based proteins, such as beans. Making sauces in large quantities to freeze for later and leaving the skin on your vegetables are also recommended to maximize your experience.  Cooking - Healthy Salads and Dressing Clinical staff conducted group or individual video education with verbal and written material and guidebook.  Patient learns that vegetables, fruits, whole grains, and legumes are the foundations of the [place]. Recommendations include how to incorporate each of these in flavorful and healthy salads, and how to create homemade salad dressings. Proper handling of ingredients is also covered. Cooking - Soups and [employer] - Soups and Desserts Clinical staff conducted group or individual video education with verbal and written material and guidebook.  Patient learns that Pritikin soups and desserts make for easy, nutritious, and  delicious snacks and meal components that are low in sodium, fat, sugar, and calorie density, while high in vitamins, minerals, and filling fiber. Recommendations include simple and healthy ideas for  soups and desserts.   Overview     The Pritikin Solution Program Overview Clinical staff conducted group or individual video education with verbal and written material and guidebook.  Patient learns that the results of the [place] Program have been documented in more than 100 articles published in peer-reviewed journals, and the benefits include reducing risk factors for (and, in some cases, even reversing) high cholesterol, high blood pressure, type 2 diabetes, obesity, and more! An overview of the three key pillars of the Pritikin Program will be covered: eating well, doing regular exercise, and having a healthy mind-set.  WORKSHOPS  Exercise: Exercise Basics: Building Your Action Plan Clinical staff led group instruction and group discussion with PowerPoint presentation and patient guidebook. To enhance the learning environment the use of posters, models and videos may be added. At the conclusion of this workshop, patients will comprehend the difference between physical activity and exercise, as well as the benefits of incorporating both, into their routine. Patients will understand the FITT (Frequency, Intensity, Time, and Type) principle and how to use it to build an exercise action plan. In addition, safety concerns and other considerations for exercise and cardiac rehab will be addressed by the presenter. The purpose of this lesson is to promote a comprehensive and effective weekly exercise routine in order to improve patients' overall level of fitness.   Managing Heart Disease: Your Path to a Healthier Heart Clinical staff led group instruction and group discussion with PowerPoint presentation and patient guidebook. To enhance the learning environment the use of posters, models and videos  may be added.At the conclusion of this workshop, patients will understand the anatomy and physiology of the heart. Additionally, they will understand how Pritikin's three pillars impact the risk factors, the progression, and the management of heart disease.  The purpose of this lesson is to provide a high-level overview of the heart, heart disease, and how the Pritikin lifestyle positively impacts risk factors.  Exercise Biomechanics Clinical staff led group instruction and group discussion with PowerPoint presentation and patient guidebook. To enhance the learning environment the use of posters, models and videos may be added. Patients will learn how the structural parts of their bodies function and how these functions impact their daily activities, movement, and exercise. Patients will learn how to promote a neutral spine, learn how to manage pain, and identify ways to improve their physical movement in order to promote healthy living. The purpose of this lesson is to expose patients to common physical limitations that impact physical activity. Participants will learn practical ways to adapt and manage aches and pains, and to minimize their effect on regular exercise. Patients will learn how to maintain good posture while sitting, walking, and lifting.  Balance Training and Fall Prevention  Clinical staff led group instruction and group discussion with PowerPoint presentation and patient guidebook. To enhance the learning environment the use of posters, models and videos may be added. At the conclusion of this workshop, patients will understand the importance of their sensorimotor skills (vision, proprioception, and the vestibular system) in maintaining their ability to balance as they age. Patients will apply a variety of balancing exercises that are appropriate for their current level of function. Patients will understand the common causes for poor balance, possible solutions to these  problems, and ways to modify their physical environment in order to minimize their fall risk. The purpose of this lesson is to teach patients about the importance of maintaining balance as they age and ways to minimize their  risk of falling.  WORKSHOPS   Nutrition:  Fueling a Scientist, research (physical sciences) led group instruction and group discussion with PowerPoint presentation and patient guidebook. To enhance the learning environment the use of posters, models and videos may be added. Patients will review the foundational principles of the [place] and understand what constitutes a serving size in each of the food groups. Patients will also learn Pritikin-friendly foods that are better choices when away from home and review make-ahead meal and snack options. Calorie density will be reviewed and applied to three nutrition priorities: weight maintenance, weight loss, and weight gain. The purpose of this lesson is to reinforce (in a group setting) the key concepts around what patients are recommended to eat and how to apply these guidelines when away from home by planning and selecting Pritikin-friendly options. Patients will understand how calorie density may be adjusted for different weight management goals.  Mindful Eating  Clinical staff led group instruction and group discussion with PowerPoint presentation and patient guidebook. To enhance the learning environment the use of posters, models and videos may be added. Patients will briefly review the concepts of the [place] and the importance of low-calorie dense foods. The concept of mindful eating will be introduced as well as the importance of paying attention to internal hunger signals. Triggers for non-hunger eating and techniques for dealing with triggers will be explored. The purpose of this lesson is to provide patients with the opportunity to review the basic principles of the [place], discuss the value of  eating mindfully and how to measure internal cues of hunger and fullness using the Hunger Scale. Patients will also discuss reasons for non-hunger eating and learn strategies to use for controlling emotional eating.  Targeting Your Nutrition Priorities Clinical staff led group instruction and group discussion with PowerPoint presentation and patient guidebook. To enhance the learning environment the use of posters, models and videos may be added. Patients will learn how to determine their genetic susceptibility to disease by reviewing their family history. Patients will gain insight into the importance of diet as part of an overall healthy lifestyle in mitigating the impact of genetics and other environmental insults. The purpose of this lesson is to provide patients with the opportunity to assess their personal nutrition priorities by looking at their family history, their own health history and current risk factors. Patients will also be able to discuss ways of prioritizing and modifying the [place] for their highest risk areas  Menu  Clinical staff led group instruction and group discussion with PowerPoint presentation and patient guidebook. To enhance the learning environment the use of posters, models and videos may be added. Using menus brought in from [employer], or printed from [employer], patients will apply the [place] dining out guidelines that were presented in the [employer] video. Patients will also be able to practice these guidelines in a variety of provided scenarios. The purpose of this lesson is to provide patients with the opportunity to practice hands-on learning of the [place] with actual menus and practice scenarios.  Label Reading Clinical staff led group instruction and group discussion with PowerPoint presentation and patient guidebook. To enhance the learning environment the use of posters, models and videos may be  added. Patients will review and discuss the Pritikin label reading guidelines presented in Pritikin's Label Reading Educational series video. Using fool labels brought in from local grocery stores and markets, patients will apply  the label reading guidelines and determine if the packaged food meet the Pritikin guidelines. The purpose of this lesson is to provide patients with the opportunity to review, discuss, and practice hands-on learning of the Pritikin Label Reading guidelines with actual packaged food labels. [place] Workshops are designed to teach patients ways to prepare quick, simple, and affordable recipes at home. The importance of nutrition's role in chronic disease risk reduction is reflected in its emphasis in the overall Pritikin program. By learning how to prepare essential core Pritikin Eating Plan recipes, patients will increase control over what they eat; be able to customize the flavor of foods without the use of added salt, sugar, or fat; and improve the quality of the food they consume. By learning a set of core recipes which are easily assembled, quickly prepared, and affordable, patients are more likely to prepare more healthy foods at home. These workshops focus on convenient breakfasts, simple entres, side dishes, and desserts which can be prepared with minimal effort and are consistent with nutrition recommendations for cardiovascular risk reduction. Cooking [employer] are taught by a Engineer, materials (RD) who has been trained by the [employer]. The chef or RD has a clear understanding of the importance of minimizing - if not completely eliminating - added fat, sugar, and sodium in recipes. Throughout the series of [place] Workshop sessions, patients will learn about healthy ingredients and efficient methods of cooking to build confidence in their capability to prepare    Cooking School weekly topics:  Adding  Flavor- Sodium-Free  Fast and Healthy Breakfasts  Powerhouse Plant-Based Proteins  Satisfying Salads and Dressings  Simple Sides and Sauces  International Cuisine-Spotlight on the [place] Zones  Delicious Desserts  Savory Soups  Efficiency Cooking - Meals in a Snap  Tasty Appetizers and Snacks  Comforting Weekend Breakfasts  One-Pot Wonders   Fast Evening Meals  Easy [place] (Psychosocial): New Thoughts, New Behaviors Clinical staff led group instruction and group discussion with PowerPoint presentation and patient guidebook. To enhance the learning environment the use of posters, models and videos may be added. Patients will learn and practice techniques for developing effective health and lifestyle goals. Patients will be able to effectively apply the goal setting process learned to develop at least one new personal goal.  The purpose of this lesson is to expose patients to a new skill set of behavior modification techniques such as techniques setting SMART goals, overcoming barriers, and achieving new thoughts and new behaviors.  Managing Moods and Relationships Clinical staff led group instruction and group discussion with PowerPoint presentation and patient guidebook. To enhance the learning environment the use of posters, models and videos may be added. Patients will learn how emotional and chronic stress factors can impact their health and relationships. They will learn healthy ways to manage their moods and utilize positive coping mechanisms. In addition, ICR patients will learn ways to improve communication skills. The purpose of this lesson is to expose patients to ways of understanding how one's mood and health are intimately connected. Developing a healthy outlook can help build positive relationships and connections with others. Patients will understand the importance of utilizing effective communication skills  that include actively listening and being heard. They will learn and understand the importance of the "4 Cs" and especially Connections in fostering of a Healthy Mind-Set.  Healthy Sleep for a Healthy Heart Clinical staff  led group instruction and group discussion with PowerPoint presentation and patient guidebook. To enhance the learning environment the use of posters, models and videos may be added. At the conclusion of this workshop, patients will be able to demonstrate knowledge of the importance of sleep to overall health, well-being, and quality of life. They will understand the symptoms of, and treatments for, common sleep disorders. Patients will also be able to identify daytime and nighttime behaviors which impact sleep, and they will be able to apply these tools to help manage sleep-related challenges. The purpose of this lesson is to provide patients with a general overview of sleep and outline the importance of quality sleep. Patients will learn about a few of the most common sleep disorders. Patients will also be introduced to the concept of "sleep hygiene," and discover ways to self-manage certain sleeping problems through simple daily behavior changes. Finally, the workshop will motivate patients by clarifying the links between quality sleep and their goals of heart-healthy living.   Recognizing and Reducing Stress Clinical staff led group instruction and group discussion with PowerPoint presentation and patient guidebook. To enhance the learning environment the use of posters, models and videos may be added. At the conclusion of this workshop, patients will be able to understand the types of stress reactions, differentiate between acute and chronic stress, and recognize the impact that chronic stress has on their health. They will also be able to apply different coping mechanisms, such as reframing negative self-talk. Patients will have the opportunity to practice a variety of stress management  techniques, such as deep abdominal breathing, progressive muscle relaxation, and/or guided imagery.  The purpose of this lesson is to educate patients on the role of stress in their lives and to provide healthy techniques for coping with it.  Learning Barriers/Preferences:  Learning Barriers/Preferences - 06/22/22 1020       Learning Barriers/Preferences   Learning Barriers Exercise Concerns   Pt wears glasses, dizzy, orthostatic HTN   Learning Preferences Audio;Computer/Internet;Group Instruction;Individual Instruction;Pictoral;Skilled Demonstration;Verbal Instruction;Video;Written Material             Education Topics:  Knowledge Questionnaire Score:  Knowledge Questionnaire Score - 06/22/22 1021       Knowledge Questionnaire Score   Pre Score 21/24             Core Components/Risk Factors/Patient Goals at Admission:  Personal Goals and Risk Factors at Admission - 06/22/22 1023       Core Components/Risk Factors/Patient Goals on Admission    Weight Management Yes;Weight Loss    Intervention Weight Management: Develop a combined nutrition and exercise program designed to reach desired caloric intake, while maintaining appropriate intake of nutrient and fiber, sodium and fats, and appropriate energy expenditure required for the weight goal.;Weight Management: Provide education and appropriate resources to help participant work on and attain dietary goals.    Admit Weight 181 lb 7 oz (82.3 kg)    Expected Outcomes Short Term: Continue to assess and modify interventions until short term weight is achieved;Long Term: Adherence to nutrition and physical activity/exercise program aimed toward attainment of established weight goal;Weight Loss: Understanding of general recommendations for a balanced deficit meal plan, which promotes 1-2 lb weight loss per week and includes a negative energy balance of [phone] kcal/d;Understanding recommendations for meals to include 15-35% energy as  protein, 25-35% energy from fat, 35-60% energy from carbohydrates, less than '200mg'$  of dietary cholesterol, 20-35 gm of total fiber daily;Understanding of distribution of calorie intake throughout the day with  the consumption of 4-5 meals/snacks    Hypertension --    Intervention --    Expected Outcomes --    Lipids Yes    Intervention Provide education and support for participant on nutrition & aerobic/resistive exercise along with prescribed medications to achieve LDL '70mg'$ , HDL >'40mg'$ .    Expected Outcomes Short Term: Participant states understanding of desired cholesterol values and is compliant with medications prescribed. Participant is following exercise prescription and nutrition guidelines.;Long Term: Cholesterol controlled with medications as prescribed, with individualized exercise RX and with personalized nutrition plan. Value goals: LDL < '70mg'$ , HDL > 40 mg.    Stress Yes    Intervention Offer individual and/or small group education and counseling on adjustment to heart disease, stress management and health-related lifestyle change. Teach and support self-help strategies.;Refer participants experiencing significant psychosocial distress to appropriate mental health specialists for further evaluation and treatment. When possible, include family members and significant others in education/counseling sessions.    Expected Outcomes Short Term: Participant demonstrates changes in health-related behavior, relaxation and other stress management skills, ability to obtain effective social support, and compliance with psychotropic medications if prescribed.;Long Term: Emotional wellbeing is indicated by absence of clinically significant psychosocial distress or social isolation.    Personal Goal Other Yes    Personal Goal long and short the same: get back into exercise and feel healthier    Intervention Will continue to monitor pt and progress workloads as tolerated without sign or symptom    Expected  Outcomes Pt will achieve her goals and gain strength             Core Components/Risk Factors/Patient Goals Review:   Goals and Risk Factor Review     Row Name 06/30/22 1513             Core Components/Risk Factors/Patient Goals Review   Personal Goals Review Weight Management/Obesity;Lipids;Stress       Review Rachel Clark started intensive cardiac rehab on 06/30/22. Rachel Clark did well with exercise and did not complain feeling light headed today       Expected Outcomes Rachel Clark will continue to participate in intensive cardiac rehab for exercise, nutrition and lifestyle modifications                Core Components/Risk Factors/Patient Goals at Discharge (Final Review):   Goals and Risk Factor Review - 06/30/22 1513       Core Components/Risk Factors/Patient Goals Review   Personal Goals Review Weight Management/Obesity;Lipids;Stress    Review Rachel Clark started intensive cardiac rehab on 06/30/22. Rachel Clark did well with exercise and did not complain feeling light headed today    Expected Outcomes Rachel Clark will continue to participate in intensive cardiac rehab for exercise, nutrition and lifestyle modifications             ITP Comments:  ITP Comments     Row Name 06/22/22 1454 06/30/22 1505         ITP Comments Dr Fransico Him MD, Medical Director, Introduction to [facility] Education [facility]/Intensive Cardiac Rehab. Initial Orientation packet Reviewed with the patient 30 Day ITP review. Kelina started intensive cardiac rehab on 06/30/22 and did well with exercise               Comments: See ITP Comments

## 2022-07-05 ENCOUNTER — Encounter (HOSPITAL_COMMUNITY)
Admission: RE | Admit: 2022-07-05 | Discharge: 2022-07-05 | Disposition: A | Discharge status: Home / Self Care | Payer: Medicare Other | Source: Ambulatory Visit | Attending: Cardiology | Admitting: Cardiology

## 2022-07-05 DIAGNOSIS — Z955 Presence of coronary angioplasty implant and graft: Secondary | ICD-10-CM

## 2022-07-05 DIAGNOSIS — I214 Non-ST elevation (NSTEMI) myocardial infarction: Secondary | ICD-10-CM

## 2022-07-05 NOTE — Progress Notes (Signed)
Incomplete Session Note  Patient Details  Name: Rachel Clark MRN: [id number] Date of Birth: [birth year] Referring Provider:   Flowsheet Row INTENSIVE CARDIAC REHAB ORIENT from 06/22/2022 in [facility] [place]  Referring Provider Dr. Peter [name], MD       [name] did not complete her rehab session.  [name] reported feeling lightheaded during standing stretches. Blood pressure 132/70. Exercise stopped. Telemetry rhythm Sinus Rhythm 74. Patient drinking Gatorade. Repeat blood pressure 104/70. Patient was given graham crackers and peanut butter.   [name] was then given water to drink. [name] said that her symptoms improved some while sitting. I advised that [name] that she not exercise today. Exit blood pressure 114/70.Harrell Gave RN BSN

## 2022-07-07 ENCOUNTER — Encounter (HOSPITAL_COMMUNITY): Payer: Medicare Other

## 2022-07-07 DIAGNOSIS — Z1231 Encounter for screening mammogram for malignant neoplasm of breast: Secondary | ICD-10-CM | POA: Diagnosis not present

## 2022-07-10 ENCOUNTER — Encounter (HOSPITAL_COMMUNITY): Payer: Medicare Other

## 2022-07-11 ENCOUNTER — Other Ambulatory Visit: Payer: Self-pay | Admitting: Neurology

## 2022-07-12 ENCOUNTER — Encounter (HOSPITAL_COMMUNITY): Payer: Medicare Other

## 2022-07-14 ENCOUNTER — Encounter (HOSPITAL_COMMUNITY): Payer: Medicare Other

## 2022-07-14 ENCOUNTER — Telehealth (HOSPITAL_COMMUNITY): Payer: Self-pay | Admitting: *Deleted

## 2022-07-14 NOTE — Telephone Encounter (Signed)
Message left on department voicemail by patient's husband that she is not feeling well and that they both will be absent from cardiac rehab today.

## 2022-07-17 ENCOUNTER — Encounter (HOSPITAL_COMMUNITY): Payer: Medicare Other

## 2022-07-19 ENCOUNTER — Telehealth (HOSPITAL_COMMUNITY): Payer: Self-pay

## 2022-07-19 ENCOUNTER — Encounter (HOSPITAL_COMMUNITY): Payer: Medicare Other

## 2022-07-19 NOTE — Telephone Encounter (Signed)
Pt's husband called wanting to cancel the rest of pt's cardiac rehab sessions. I canceled the rest of pt's sessions.

## 2022-07-20 ENCOUNTER — Encounter (HOSPITAL_COMMUNITY): Payer: Self-pay | Admitting: *Deleted

## 2022-07-20 DIAGNOSIS — I214 Non-ST elevation (NSTEMI) myocardial infarction: Secondary | ICD-10-CM

## 2022-07-20 DIAGNOSIS — Z955 Presence of coronary angioplasty implant and graft: Secondary | ICD-10-CM

## 2022-07-20 NOTE — Progress Notes (Signed)
Rachel Clark attended 2 exercise sessions and 2 education classes between 06/22/22.Harrell Gave RN BSN

## 2022-07-21 ENCOUNTER — Encounter (HOSPITAL_COMMUNITY): Payer: Medicare Other

## 2022-07-24 ENCOUNTER — Encounter (HOSPITAL_COMMUNITY): Payer: Medicare Other

## 2022-07-26 ENCOUNTER — Encounter (HOSPITAL_COMMUNITY): Payer: Medicare Other

## 2022-07-28 ENCOUNTER — Encounter (HOSPITAL_COMMUNITY): Payer: Medicare Other

## 2022-07-31 ENCOUNTER — Encounter (HOSPITAL_COMMUNITY): Payer: Medicare Other

## 2022-08-01 ENCOUNTER — Encounter (HOSPITAL_COMMUNITY): Payer: Medicare Other

## 2022-08-01 DIAGNOSIS — I495 Sick sinus syndrome: Secondary | ICD-10-CM | POA: Diagnosis not present

## 2022-08-02 ENCOUNTER — Encounter (HOSPITAL_COMMUNITY): Payer: Medicare Other

## 2022-08-03 LAB — CUP PACEART REMOTE DEVICE CHECK
Battery Remaining Longevity: 50 mo
Battery Voltage: 2.98 V
Brady Statistic AP VP Percent: 0.02 %
Brady Statistic AP VS Percent: 2.27 %
Brady Statistic AS VP Percent: 0.07 %
Brady Statistic AS VS Percent: 97.64 %
Brady Statistic RA Percent Paced: 2.26 %
Brady Statistic RV Percent Paced: 0.08 %
Date Time Interrogation Session: 20240131202019
Implantable Lead Connection Status: 753985
Implantable Lead Connection Status: 753985
Implantable Lead Implant Date: 20160922
Implantable Lead Implant Date: 20160922
Implantable Lead Location: 753859
Implantable Lead Location: 753860
Implantable Lead Model: 5076
Implantable Lead Model: 5076
Implantable Pulse Generator Implant Date: 20160922
Lead Channel Impedance Value: 1425 Ohm
Lead Channel Impedance Value: 1444 Ohm
Lead Channel Impedance Value: 418 Ohm
Lead Channel Impedance Value: 494 Ohm
Lead Channel Pacing Threshold Amplitude: 0.75 V
Lead Channel Pacing Threshold Amplitude: 1.25 V
Lead Channel Pacing Threshold Pulse Width: 0.4 ms
Lead Channel Pacing Threshold Pulse Width: 0.4 ms
Lead Channel Sensing Intrinsic Amplitude: 10.125 mV
Lead Channel Sensing Intrinsic Amplitude: 10.125 mV
Lead Channel Sensing Intrinsic Amplitude: 2.25 mV
Lead Channel Sensing Intrinsic Amplitude: 2.25 mV
Lead Channel Setting Pacing Amplitude: 2.5 V
Lead Channel Setting Pacing Amplitude: 2.5 V
Lead Channel Setting Pacing Pulse Width: 0.4 ms
Lead Channel Setting Sensing Sensitivity: 2.8 mV
Zone Setting Status: 755011
Zone Setting Status: 755011

## 2022-08-04 ENCOUNTER — Encounter (HOSPITAL_COMMUNITY): Payer: Medicare Other

## 2022-08-07 ENCOUNTER — Encounter (HOSPITAL_COMMUNITY): Payer: Medicare Other

## 2022-08-07 DIAGNOSIS — E039 Hypothyroidism, unspecified: Secondary | ICD-10-CM | POA: Diagnosis not present

## 2022-08-07 DIAGNOSIS — F324 Major depressive disorder, single episode, in partial remission: Secondary | ICD-10-CM | POA: Diagnosis not present

## 2022-08-07 DIAGNOSIS — E785 Hyperlipidemia, unspecified: Secondary | ICD-10-CM | POA: Diagnosis not present

## 2022-08-07 DIAGNOSIS — I10 Essential (primary) hypertension: Secondary | ICD-10-CM | POA: Diagnosis not present

## 2022-08-07 DIAGNOSIS — I25119 Atherosclerotic heart disease of native coronary artery with unspecified angina pectoris: Secondary | ICD-10-CM | POA: Diagnosis not present

## 2022-08-09 ENCOUNTER — Encounter (HOSPITAL_COMMUNITY): Payer: Medicare Other

## 2022-08-11 ENCOUNTER — Encounter (HOSPITAL_COMMUNITY): Payer: Medicare Other

## 2022-08-14 ENCOUNTER — Encounter (HOSPITAL_COMMUNITY): Payer: Medicare Other

## 2022-08-16 ENCOUNTER — Encounter (HOSPITAL_COMMUNITY): Payer: Medicare Other

## 2022-08-18 ENCOUNTER — Encounter (HOSPITAL_COMMUNITY): Payer: Medicare Other

## 2022-08-28 NOTE — Progress Notes (Signed)
Remote pacemaker transmission.   

## 2022-08-30 ENCOUNTER — Telehealth: Payer: Self-pay | Admitting: Cardiology

## 2022-08-30 NOTE — Telephone Encounter (Signed)
Attempted to return call to patients husband (okay per DPR) unable to reach, left message to call back.

## 2022-08-30 NOTE — Telephone Encounter (Signed)
Husband calling to see if Dr. [name] can get them a referral to a neurologist, that specialize in memory lost that he trust. Husband Asking that you on speak to him bout this.  Please advise

## 2022-09-08 NOTE — Telephone Encounter (Signed)
Attempted to return call to patients husband (okay per DPR) unable to reach, left message to call back.  

## 2022-09-11 ENCOUNTER — Ambulatory Visit
Admission: EM | Admit: 2022-09-11 | Discharge: 2022-09-11 | Disposition: A | Discharge status: Home / Self Care | Payer: Medicare Other | Attending: Internal Medicine | Admitting: Internal Medicine

## 2022-09-11 ENCOUNTER — Ambulatory Visit (INDEPENDENT_AMBULATORY_CARE_PROVIDER_SITE_OTHER): Payer: Medicare Other

## 2022-09-11 DIAGNOSIS — L03115 Cellulitis of right lower limb: Secondary | ICD-10-CM

## 2022-09-11 DIAGNOSIS — M7731 Calcaneal spur, right foot: Secondary | ICD-10-CM | POA: Diagnosis not present

## 2022-09-11 DIAGNOSIS — M79671 Pain in right foot: Secondary | ICD-10-CM

## 2022-09-11 MED ORDER — DOXYCYCLINE HYCLATE 100 MG PO CAPS: 100.0000 mg | ORAL_CAPSULE | Freq: Two times a day (BID) | ORAL | 0 refills | Status: AC

## 2022-09-11 NOTE — ED Triage Notes (Signed)
Pt c/o mass/lump to right heel   Onset ~ "several weeks ago"  Denies pain in triage states only when she is stepping on it

## 2022-09-11 NOTE — ED Provider Notes (Signed)
[facility]    CSN: [id number] Arrival date & time: 09/11/22  1121      History   Chief Complaint Chief Complaint  Patient presents with   right foot lesion    HPI Rachel Clark is a 72 y.o. female.   Patient presents with lesion to lateral portion of right foot that she reports has been present for "a while" but is not able to enumerate how long it has been present.  Denies any injury to the area.  Denies any known foreign body to the area.  Denies any drainage or fever.     Past Medical History:  Diagnosis Date   Arthritis    "fingers" (06/29/2016)   Chronic lower back pain    Colon cancer ([place]) 12/04/2011   s/p Laparoscopic-assisted transverse colectomy on 12/19/2011 by Dr. Donne Hazel.  pT3 N0 M0.    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   last seizure was in 1972 ;takes Tegretol (06/29/2016)    Headache    "weekly" (06/29/2016)   Hyperlipidemia    Hyponatremia    Hypothyroid    Iron deficiency anemia 11/17/2011   Memory change 11/13/2016   Monoclonal gammopathy of unknown significance    Orthostatic hypotension    Presence of permanent cardiac pacemaker    Syncope and collapse    pacemaker implanted    Patient Active Problem List   Diagnosis Date Noted   Right leg pain 01/13/2022   Hypocalcemia 01/13/2022   NSTEMI (non-ST elevated myocardial infarction) ([place]) 11/11/2021   Presence of stent in left circumflex coronary artery    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    Non-ST elevation (NSTEMI) myocardial infarction ([facility]) 11/10/2021   Acute upper GI bleed 03/13/2021   GI bleed 03/13/2021   Preventive measure 01/14/2021   Orthostatic hypotension 12/21/2020   Memory change 11/13/2016   Acute bronchitis 03/23/2016   Chest pain with high risk for  cardiac etiology 11/05/2015   Essential hypertension 08/25/2015   Sick sinus syndrome ([place]) 08/16/2015   Syncope and collapse 08/10/2015   CAD, multiple vessel    Chest pain    Cardiac pacemaker in situ 07/25/2015   Angina pectoris ([place]) 03/24/2015   Unstable angina ([place]) 10/26/2014   Seizures ([place]) 02/19/2014   Lung nodule 02/06/2013   Thyroid nodule 02/06/2013   History of colon cancer 12/04/2011   Iron deficiency anemia due to chronic blood loss 11/17/2011   GERD (gastroesophageal reflux disease) 08/17/2011   Monoclonal gammopathy of unknown significance    Hypothyroid    Coronary artery disease, post CABG 2007     Dyslipidemia     Past Surgical History:  Procedure Laterality Date   ANTERIOR CERVICAL DECOMP/DISCECTOMY FUSION  1980's   BACK SURGERY     CARDIAC CATHETERIZATION  05/20/2009   obstructive native vessel disease in LAD, RCA, and first diagonal, patent vein graft to distal RCA and LIMA to LAD,normal. ef 60%   CARDIAC CATHETERIZATION N/A 03/24/2015   Procedure: Left Heart Cath and Cors/Grafts Angiography;  Surgeon: Wellington Hampshire, MD;  Location: [facility];  Service: Cardiovascular;  Laterality: N/A;   CARDIAC CATHETERIZATION N/A 07/28/2015   Procedure: Left Heart Cath and Coronary Angiography;  Surgeon: Troy Sine, MD;  Location: [place] [facility];  Service: Cardiovascular;  Laterality: N/A;   CARDIAC CATHETERIZATION N/A 11/09/2015   Procedure: Coronary Stent Intervention;  Surgeon: Sherren Mocha, MD;  Location: [place] [facility];  Service: Cardiovascular;  Laterality: N/A;   CARDIAC CATHETERIZATION N/A 11/09/2015   Procedure: Left Heart Cath and Coronary Angiography;  Surgeon: Sherren Mocha, MD;  Location: [place];  Service: Cardiovascular;  Laterality: N/A;   CARDIAC CATHETERIZATION N/A 06/29/2016   Procedure: Left Heart Cath and Coronary Angiography;  Surgeon: Nelva Bush, MD;  Location: [place] [facility];  Service: Cardiovascular;   Laterality: N/A;   CARDIAC CATHETERIZATION N/A 06/29/2016   Procedure: Intravascular Pressure Wire/FFR Study;  Surgeon: Nelva Bush, MD;  Location: [facility] [facility];  Service: Cardiovascular;  Laterality: N/A;   CARDIAC CATHETERIZATION N/A 06/29/2016   Procedure: Coronary Balloon Angioplasty;  Surgeon: Nelva Bush, MD;  Location: [place] [facility];  Service: Cardiovascular;  Laterality: N/A;   COLON RESECTION  12/19/2011   Procedure: COLON RESECTION LAPAROSCOPIC;  Surgeon: Rolm Bookbinder, MD;  Location: [place];  Service: General;  Laterality: N/A;  laparoscopic hand assisted partial colon resection   COLON SURGERY     COLONOSCOPY     CORONARY ANGIOPLASTY WITH STENT PLACEMENT  ~ 2007   1 stent   CORONARY ARTERY BYPASS GRAFT  2007   "CABG X2"   DILATION AND CURETTAGE OF UTERUS  1973   EP IMPLANTABLE DEVICE N/A 03/25/2015   MDT Advisa DR pacemaker implanted by Dr Rayann Heman for transient complete heart block and syncope   ESOPHAGOGASTRODUODENOSCOPY (EGD) WITH PROPOFOL N/A 03/14/2021   Procedure: ESOPHAGOGASTRODUODENOSCOPY (EGD) WITH PROPOFOL;  Surgeon: Ronnette Juniper, MD;  Location: [place];  Service: Gastroenterology;  Laterality: N/A;   ESOPHAGOGASTRODUODENOSCOPY (EGD) WITH PROPOFOL N/A 11/13/2021   Procedure: ESOPHAGOGASTRODUODENOSCOPY (EGD) WITH PROPOFOL;  Surgeon: Otis Brace, MD;  Location: [place];  Service: Gastroenterology;  Laterality: N/A;   HEMOSTASIS CLIP PLACEMENT  03/14/2021   Procedure: HEMOSTASIS CLIP PLACEMENT;  Surgeon: Ronnette Juniper, MD;  [facility];  Service: Gastroenterology;;   INSERT / REPLACE / [place] CATH AND CORONARY ANGIOGRAPHY N/A 11/11/2021   Procedure: LEFT HEART CATH AND CORONARY ANGIOGRAPHY;  Surgeon: Leonie Man, MD;  Location: [place] [facility];  Service: Cardiovascular;  Laterality: N/A;   LEFT HEART CATH AND CORS/GRAFTS ANGIOGRAPHY N/A 02/21/2018   Procedure: LEFT HEART CATH AND CORS/GRAFTS  ANGIOGRAPHY;  Surgeon: Lorretta Harp, MD;  Location: [place] [facility];  Service: Cardiovascular;  Laterality: N/A;   LEFT HEART CATH AND CORS/GRAFTS ANGIOGRAPHY N/A 03/08/2021   Procedure: LEFT HEART CATH AND CORS/GRAFTS ANGIOGRAPHY;  Surgeon: [name], Peter M, MD;  Location: [place] [facility];  Service: Cardiovascular;  Laterality: N/A;   PORT-A-CATH REMOVAL N/A 06/12/2013   Procedure: REMOVAL PORT-A-CATH;  Surgeon: Rolm Bookbinder, MD;  Location: [place];  Service: General;  Laterality: N/A;   PORTACATH PLACEMENT  06/04/2012   Procedure: INSERTION PORT-A-CATH;  Surgeon: Rolm Bookbinder, MD;  Location: [place];  Service: General;  Laterality: N/A;  Insertion of port-a-cath    POSTERIOR LAMINECTOMY / [place]  03/14/2021   Procedure: SCLEROTHERAPY;  Surgeon: Ronnette Juniper, MD;  Location: [facility] ENDOSCOPY;  Service: Gastroenterology;;   VAGINAL HYSTERECTOMY      OB History   No obstetric history on file.      Home Medications    Prior to Admission medications   Medication  Sig Start Date End Date Taking? Authorizing Provider  doxycycline (VIBRAMYCIN) 100 MG capsule Take 1 capsule (100 mg total) by mouth 2 (two) times daily for 10 days. 09/11/22 09/21/22 Yes , Michele Rockers, FNP  acetaminophen (TYLENOL) 500 MG tablet Take 500-1,000 mg by mouth daily as needed for mild pain or headache.    [provider]  aspirin 81 MG chewable tablet Chew 1 tablet (81 mg total) by mouth daily. 06/30/16   Arbutus Leas, NP  Azelastine HCl 0.15 % SOLN Place 1 spray into both nostrils daily as needed (seasonal allergies). 12/22/14   [provider]  Bempedoic Acid-Ezetimibe (NEXLIZET) 180-10 MG TABS Take 180 mg by mouth daily. 04/25/22   [name], Peter M, MD  calcium carbonate (TUMS - DOSED IN MG ELEMENTAL CALCIUM) 500 MG chewable tablet Chew 500 mg by mouth daily.    [provider]  carbamazepine (TEGRETOL XR) 200 MG 12 hr  tablet Take 3 tablets (600 mg total) by mouth 2 (two) times daily. 09/13/21 12/07/22  Penumalli, Earlean Polka, MD  Cholecalciferol (VITAMIN D) 50 MCG (2000 UT) CAPS Take 4,000 Units by mouth daily.    [provider]  diclofenac Sodium (VOLTAREN) 1 % GEL Apply 2 g topically 4 (four) times daily. Patient not taking: Reported on 06/21/2022 01/19/22   Lennice Sites, DO  diphenhydrAMINE (BENADRYL) 25 MG tablet Take 25 mg by mouth daily.    [provider]  diphenhydramine-acetaminophen (TYLENOL PM) 25-500 MG TABS tablet Take 1 tablet by mouth at bedtime.    [provider]  DULoxetine (CYMBALTA) 60 MG capsule Take 60 mg by mouth 2 (two) times daily.    [provider]  ethosuximide (ZARONTIN) 250 MG capsule TAKE 2 CAPSULES IN THE MORNING, TAKE 2 CAPSULES AT NIGHT 07/11/22   Suzzanne Cloud, NP  Evolocumab with Infusor ([place]) 420 MG/3.5ML SOCT Inject 420 mg into the skin every 30 (thirty) days. 11/07/21   [name], Peter M, MD  fluticasone [facility]) 50 MCG/ACT nasal spray Place 1 spray into both nostrils daily as needed for allergies.    [provider]  isosorbide mononitrate (IMDUR) 30 MG 24 hr tablet TAKE HALF TABLET ('15MG'$  TOTAL) BY MOUTH EVERY DAY 04/20/22   [name], Peter M, MD  levothyroxine (SYNTHROID, LEVOTHROID) 50 MCG tablet Take 50 mcg by mouth daily before breakfast.    [provider]  memantine (NAMENDA) 10 MG tablet Take 1 tablet (10 mg total) by mouth 2 (two) times daily. 09/13/21   Penumalli, Earlean Polka, MD  nitroGLYCERIN (NITROSTAT) 0.4 MG SL tablet Place 1 tablet (0.4 mg total) under the tongue every 5 (five) minutes as needed. For chest pain. 11/23/21   [name], Peter M, MD  pantoprazole (PROTONIX) 40 MG tablet TAKE ONE TABLET BY MOUTH TWICE DAILY FOR 30 DAYS THEN TAKE ONE TABLET BY MOUTH DAILY 02/08/22   [name], Peter M, MD  prasugrel (EFFIENT) 10 MG TABS tablet Take 1 tablet (10 mg total) by mouth daily. 04/25/22   [name], Peter M,  MD    Family History Family History  Problem Relation Age of Onset   Heart attack Father    Heart disease Father    Heart attack Brother 31   Cancer Mother        lung   Stroke Neg Hx     Social History Social History   Tobacco Use   Smoking status: Former    Packs/day: 0.30    Years: 20.00    Total  pack years: 6.00    Types: Cigarettes    Quit date: 06/06/1981    Years since quitting: 41.2   Smokeless tobacco: Never  Vaping Use   Vaping Use: Never used  Substance Use Topics   Alcohol use: Yes    Alcohol/week: 1.0 standard drink of alcohol    Types: 1 Glasses of wine per week    Comment: occ   Drug use: No     Allergies   Sulfamethoxazole, Aspirin, Crestor [rosuvastatin], Erythromycin, Lisinopril, Niacin and related, Penicillin g benzathine, Statins, Sulfa drugs cross reactors, Tetracyclines & related, Xeloda [capecitabine], Penicillins, and Plavix [clopidogrel bisulfate]   Review of Systems Review of Systems Per HPI  Physical Exam Triage Vital Signs ED Triage Vitals [09/11/22 1243]  Enc Vitals Group     BP (!) 143/72     Pulse Rate 100     Resp 16     Temp 97.7 F (36.5 C)     Temp Source Oral     SpO2 97 %     Weight      Height      Head Circumference      Peak Flow      Pain Score 0     Pain Loc      Pain Edu?      Excl. in [place]?    No data found.  Updated Vital Signs BP (!) 143/72 (BP Location: Left Arm)   Pulse 100   Temp 97.7 F (36.5 C) (Oral)   Resp 16   SpO2 97%   Visual Acuity Right Eye Distance:   Left Eye Distance:   Bilateral Distance:    Right Eye Near:   Left Eye Near:    Bilateral Near:     Physical Exam Constitutional:      General: She is not in acute distress.    Appearance: Normal appearance. She is not toxic-appearing or diaphoretic.  HENT:     Head: Normocephalic and atraumatic.  Eyes:     Extraocular Movements: Extraocular movements intact.     Conjunctiva/sclera: Conjunctivae normal.  Pulmonary:      Effort: Pulmonary effort is normal.  Musculoskeletal:       Feet:  Feet:     Comments: Patient has approximately 1-1.5 centimeter mildly swollen area of fluctuance with surrounding erythema present to lateral portion of right heel.  There is no drainage noted.  Patient can wiggle toes.  Capillary refill and pulses intact. Neurological:     General: No focal deficit present.     Mental Status: She is alert and oriented to person, place, and time. Mental status is at baseline.  Psychiatric:        Mood and Affect: Mood normal.        Behavior: Behavior normal.        Thought Content: Thought content normal.        Judgment: Judgment normal.      UC Treatments / Results  Labs (all labs ordered are listed, but only abnormal results are displayed) Labs Reviewed - No data to display  EKG   Radiology DG Os Calcis Right  Result Date: 09/11/2022 CLINICAL DATA:  Foot pain, mass at RIGHT heel with pain EXAM: RIGHT OS CALCIS - 2+ VIEW COMPARISON:  None Available. FINDINGS: Mild osseous demineralization. Joint spaces preserved. No fracture, dislocation, or bone destruction. Small vessel vascular calcifications. Few tiny nonspecific soft tissue calcifications plantar to a tiny plantar calcaneal spur. IMPRESSION: Tiny plantar calcaneal spur with few  overlying nonspecific soft tissue calcifications. Otherwise negative exam. Electronically Signed   By: Lavonia Dana M.D.   On: 09/11/2022 13:12    Procedures Procedures (including critical care time)  Medications Ordered in UC Medications - No data to display  Initial Impression / Assessment and Plan / UC Course  I have reviewed the triage vital signs and the nursing notes.  Pertinent labs & imaging results that were available during my care of the patient were reviewed by me and considered in my medical decision making (see chart for details).     It appears that patient may have cellulitis of the right lateral foot. No area that needs  drainage.  I did perform an x-ray to ensure that there is no obvious foreign body despite patient denying any obvious foreign body that she is aware of.  X-ray was negative for any acute process or any known foreign body.  Will treat with doxycycline given penicillin allergy.  Patient has this listed as an allergy but reports that it only causes nausea.  Advised patient to take this medication with food to avoid nausea.  Advised following up with podiatry for further evaluation and management.  Ambulatory referral to podiatry was placed.  Patient provided with contact information to follow-up with them if they do not hear from them in 48 hours.  Advised supportive care.  Advised strict ER precautions.  Patient verbalized understanding and was agreeable with plan. Final Clinical Impressions(s) / UC Diagnoses   Final diagnoses:  Cellulitis of right foot     Discharge Instructions      X-ray was normal.  I have prescribed an antibiotic to treat infection of the skin.  Follow-up with podiatry.  Referral has been made.  If they do not call you in 48 hours, please reach out to them.    ED Prescriptions     Medication Sig Dispense Auth. Provider   doxycycline (VIBRAMYCIN) 100 MG capsule Take 1 capsule (100 mg total) by mouth 2 (two) times daily for 10 days. 20 capsule Teodora Medici, [place]      PDMP not reviewed this encounter.   Teodora Medici, [place] 09/11/22 1336

## 2022-09-11 NOTE — Discharge Instructions (Signed)
X-ray was normal.  I have prescribed an antibiotic to treat infection of the skin.  Follow-up with podiatry.  Referral has been made.  If they do not call you in 48 hours, please reach out to them.

## 2022-09-14 ENCOUNTER — Encounter: Payer: Self-pay | Admitting: Neurology

## 2022-09-14 ENCOUNTER — Telehealth: Payer: Self-pay | Admitting: Neurology

## 2022-09-14 ENCOUNTER — Ambulatory Visit (INDEPENDENT_AMBULATORY_CARE_PROVIDER_SITE_OTHER): Payer: Medicare Other | Admitting: Neurology

## 2022-09-14 VITALS — BP 154/74 | HR 80 | Ht 67.0 in | Wt 178.0 lb

## 2022-09-14 DIAGNOSIS — R569 Unspecified convulsions: Secondary | ICD-10-CM | POA: Diagnosis not present

## 2022-09-14 DIAGNOSIS — I951 Orthostatic hypotension: Secondary | ICD-10-CM

## 2022-09-14 DIAGNOSIS — R413 Other amnesia: Secondary | ICD-10-CM

## 2022-09-14 MED ORDER — MEMANTINE HCL 10 MG PO TABS
10.0000 mg | ORAL_TABLET | Freq: Two times a day (BID) | ORAL | 4 refills | Status: DC
Start: 2022-09-14 — End: 2023-04-26

## 2022-09-14 NOTE — Telephone Encounter (Signed)
Per chart review, patient has appt with neurology today 09/14/22

## 2022-09-14 NOTE — Progress Notes (Signed)
Patient: Rachel Clark Date of Birth: 1951-03-12  Reason for Visit: Follow up History from: Patient Primary Neurologist: [facility]  ASSESSMENT AND PLAN 72 y.o. year old female  With 1.  Seizure disorder 2.  Memory loss 3.  Orthostatic hypotension  Main concern is memory today.  MoCA 18/30.  She will remain on Namenda.  Prior neuropsychological evaluation in January 2019 showed mild cognitive impairment.  I will place a new referral to get an updated assessment.  She will remain on Namenda.  I suspect there is some contribution from her fibromyalgia, mood disorder, polypharmacy treatment, and sedentary lifestyle.  We discussed considering sleep study, husband does not think she has telltale signs.  She has not had a seizure in 51 years.  I will update labs, she has had a baseline low sodium level in the 130s.  We discussed potentially reducing the carbamazepine or ethosuximide to see if any improvement in above complaints.  She does not drive a car.  Her last CT head was in March 2022 showing no acute abnormality, nothing new going on to prompt a new scan at this time.  Follow-up with me in 6 months. Once labs return will get a plan for medication.  HISTORY OF PRESENT ILLNESS: Today 09/14/22 Here with husband. Does have episodes of lightheadedness, drinks water/gatorade, usually helps. No syncope spells. She tends to sleep a lot, takes naps. Feels memory has gotten worse. Remains on Namenda. Gets overwhelmed easily. Misplaces things in the house. Not as social. She doesn't drive. Often complains of headache, going on for several years, blames allergies, feels constant, pressure from occipital to top of head. If she gets out and about with friends/husband, headache goes away. "Always Depressed". Doesn't see psychiatry. We talked about sleep apnea, snores some, but husband wears CPAP, he hasn't seen any signs. No seizures in 51 years. Has pacemaker. Has several stents placed in May 2023. Takes  Vitamin D, B12.  Neuropsychological evaluation January 2019: clinical Impressions: Amnestic Mild Cognitive Impairment. Results of cognitive testing revealed impairment in several domains of cognitive function relative to age-based normative sample and relative to the patient's estimated baseline intellectual abilities. She demonstrated the most prominent impairment in retrieval of newly learned information, and she did not benefit from cueing or recognition formation, suggesting consolidation dysfunction. Semantic fluency also was impaired. Auditory comprehension of complex ideational material and auditory attention/working memory were also somewhat below expectation.  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 However, I am concerned about underlying Alzheimer's disease based on her cognitive profile.  Fortunately, she is not reporting significant depression or anxiety at the present time, but she does endorse psychosocial stressors. These stressors may be exacerbating underlying cognitive impairment, but I do not believe they are solely responsible for the impairment seen on this examination.   HISTORY  UPDATE (09/13/21, VRP): Since last visit, doing well. Symptoms are stable. No more seizures. Memory loss stable. No major changes in ADLs. No more syncope attacks.   PRIOR HPI (12/21/20, Dr. Jannifer Franklin): Ms. Staats is a 72 year old right-handed white female with a history of seizures since she was a child, she has not had a seizure in over 50 years, she is on carbamazepine and Zarontin, she has never wished to try to get off of her medication.  Her son also has a history of seizures.  Since August  2019 she has had low sodium levels.  She has been on a combination of carbamazepine and Cymbalta, but she has been on this for quite a number of years  without any sodium level problems.  The patient has more recently had significant issues with orthostatic hypotension.  She does have a monoclonal antibody and is followed through hematology.  She has not had a demonstrated peripheral neuropathy on nerve conduction studies in the past.  The patient however has had significant issues with being dizzy with standing, she has had multiple syncopal episodes, the most recent was 2 days ago.  The patient has been to the emergency room on 04 September 2020 after she hit her head with a blackout.  She went to the emergency room on 10 Nov 2020 with another significant blackout.  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  The patient is followed by Dr. Peter [name].  Blood pressure medications have been reduced in dosing.  The patient has gone from 10 mg a day to 2.5 mg and Norvasc, and the Coreg dose was cut in half.  The patient has been told to increase fluids and salt.  She does have compression stockings.  The patient has had some decline in memory over time.  She is no longer driving mainly due to the dizziness issue.  The patient has had increasing problems with remembering recipes with cooking, she is still managing her medications.  Her husband helps her with the appointments.  The patient is repeating herself frequently throughout the day.  The patient claims that she feels bored, isolated.  She sleeps quite a bit throughout the day.  She comes to this office for further evaluation.  The patient does have a prior history of depression which is still an issue for her.  REVIEW OF SYSTEMS: Out of a complete 14 system review of symptoms, the patient complains only of the following symptoms, and all other reviewed systems are negative.  See HPI  ALLERGIES: Allergies  Allergen Reactions   Sulfamethoxazole Anaphylaxis    Don't recall   Aspirin Other (See Comments)    GI upset- can tolerate 81 mg ASA, just  not full doses   Crestor [Rosuvastatin]     myalgias   Erythromycin Nausea Only    Gi upset   Lisinopril Cough   Niacin And Related Other (See Comments)    Don't recall   Penicillin G Benzathine     Other reaction(s): Unknown   Statins Other (See Comments)    Muscle pain   Sulfa Drugs Cross Reactors Swelling    Don't recall   Tetracyclines & Related Nausea Only   Xeloda [Capecitabine] Diarrhea   Penicillins Nausea Only and Rash        Plavix [Clopidogrel Bisulfate] Rash    HOME MEDICATIONS: Outpatient Medications Prior to Visit  Medication Sig Dispense Refill   acetaminophen (TYLENOL) 500 MG tablet Take 500-1,000 mg by mouth daily as needed for mild pain or headache.     aspirin 81 MG chewable tablet Chew 1 tablet (81 mg total) by mouth daily.     Azelastine HCl 0.15 % SOLN Place 1 spray into both nostrils daily as needed (seasonal allergies).  12   Bempedoic Acid-Ezetimibe (NEXLIZET) 180-10 MG TABS Take 180 mg by mouth daily. 90 tablet 3   calcium carbonate (TUMS - DOSED IN MG ELEMENTAL CALCIUM) 500 MG chewable tablet Chew 500 mg by mouth daily.  carbamazepine (TEGRETOL XR) 200 MG 12 hr tablet Take 3 tablets (600 mg total) by mouth 2 (two) times daily. 540 tablet 4   Cholecalciferol (VITAMIN D) 50 MCG (2000 UT) CAPS Take 4,000 Units by mouth daily.     Cholecalciferol 50 MCG (2000 UT) TABS 2 tablets Orally Once a day for 30 day(s)     diphenhydrAMINE (BENADRYL) 25 MG tablet Take 25 mg by mouth daily.     diphenhydramine-acetaminophen (TYLENOL PM) 25-500 MG TABS tablet Take 1 tablet by mouth at bedtime.     doxycycline (VIBRAMYCIN) 100 MG capsule Take 1 capsule (100 mg total) by mouth 2 (two) times daily for 10 days. 20 capsule 0   DULoxetine (CYMBALTA) 60 MG capsule Take 60 mg by mouth 2 (two) times daily.     ethosuximide (ZARONTIN) 250 MG capsule TAKE 2 CAPSULES IN THE MORNING, TAKE 2 CAPSULES AT NIGHT 360 capsule 3   Evolocumab with Infusor ([place]) 420  MG/3.5ML SOCT Inject 420 mg into the skin every 30 (thirty) days. 3.6 mL 11   fluticasone (FLONASE) 50 MCG/ACT nasal spray Place 1 spray into both nostrils daily as needed for allergies.     isosorbide mononitrate (IMDUR) 30 MG 24 hr tablet TAKE HALF TABLET ('15MG'$  TOTAL) BY MOUTH EVERY DAY 30 tablet 3   levothyroxine (SYNTHROID, LEVOTHROID) 50 MCG tablet Take 50 mcg by mouth daily before breakfast.     memantine (NAMENDA) 10 MG tablet Take 1 tablet (10 mg total) by mouth 2 (two) times daily. 180 tablet 4   nitroGLYCERIN (NITROSTAT) 0.4 MG SL tablet Place 1 tablet (0.4 mg total) under the tongue every 5 (five) minutes as needed. For chest pain. 25 tablet 11   pantoprazole (PROTONIX) 40 MG tablet TAKE ONE TABLET BY MOUTH TWICE DAILY FOR 30 DAYS THEN TAKE ONE TABLET BY MOUTH DAILY 120 tablet 2   prasugrel (EFFIENT) 10 MG TABS tablet Take 1 tablet (10 mg total) by mouth daily. 90 tablet 3   diclofenac Sodium (VOLTAREN) 1 % GEL Apply 2 g topically 4 (four) times daily. (Patient not taking: Reported on 06/21/2022) 2 g 0   No facility-administered medications prior to visit.    PAST MEDICAL HISTORY: Past Medical History:  Diagnosis Date   Arthritis    "fingers" (06/29/2016)   Chronic lower back pain    Colon cancer ([place]) 12/04/2011   s/p Laparoscopic-assisted transverse colectomy on 12/19/2011 by Dr. Donne Hazel.  pT3 N0 M0.    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, controlled ([place]) 12/06/2011   last seizure was in 1972 ;takes Tegretol (06/29/2016)    Headache    "weekly" (06/29/2016)   Hyperlipidemia    Hyponatremia    Hypothyroid    Iron deficiency anemia 11/17/2011   Memory change 11/13/2016   Monoclonal gammopathy of unknown significance    Orthostatic hypotension    Presence of permanent cardiac pacemaker    Syncope and collapse     pacemaker implanted    PAST SURGICAL HISTORY: Past Surgical History:  Procedure Laterality Date   ANTERIOR CERVICAL DECOMP/DISCECTOMY FUSION  1980's   BACK SURGERY     CARDIAC CATHETERIZATION  05/20/2009   obstructive native vessel disease in LAD, RCA, and first diagonal, patent vein graft to distal RCA and LIMA  to LAD,normal. ef 60%   CARDIAC CATHETERIZATION N/A 03/24/2015   Procedure: Left Heart Cath and Cors/Grafts Angiography;  Surgeon: Wellington Hampshire, MD;  Location: [facility];  Service: Cardiovascular;  Laterality: N/A;   CARDIAC CATHETERIZATION N/A 07/28/2015   Procedure: Left Heart Cath and Coronary Angiography;  Surgeon: Troy Sine, MD;  Location: [facility];  Service: Cardiovascular;  Laterality: N/A;   CARDIAC CATHETERIZATION N/A 11/09/2015   Procedure: Coronary Stent Intervention;  Surgeon: Sherren Mocha, MD;  Location: [facility];  Service: Cardiovascular;  Laterality: N/A;   CARDIAC CATHETERIZATION N/A 11/09/2015   Procedure: Left Heart Cath and Coronary Angiography;  Surgeon: Sherren Mocha, MD;  Location: [place] [facility];  Service: Cardiovascular;  Laterality: N/A;   CARDIAC CATHETERIZATION N/A 06/29/2016   Procedure: Left Heart Cath and Coronary Angiography;  Surgeon: Nelva Bush, MD;  Location: [place];  Service: Cardiovascular;  Laterality: N/A;   CARDIAC CATHETERIZATION N/A 06/29/2016   Procedure: Intravascular Pressure Wire/FFR Study;  Surgeon: Nelva Bush, MD;  Location: [place];  Service: Cardiovascular;  Laterality: N/A;   CARDIAC CATHETERIZATION N/A 06/29/2016   Procedure: Coronary Balloon Angioplasty;  Surgeon: Nelva Bush, MD;  Location: [place] [facility];  Service: Cardiovascular;  Laterality: N/A;   COLON RESECTION  12/19/2011   Procedure: COLON RESECTION LAPAROSCOPIC;  Surgeon: Rolm Bookbinder, MD;  Location: [facility];  Service: General;  Laterality: N/A;  laparoscopic hand assisted partial colon  resection   COLON SURGERY     COLONOSCOPY     CORONARY ANGIOPLASTY WITH STENT PLACEMENT  ~ 2007   1 stent   CORONARY ARTERY BYPASS GRAFT  2007   "CABG X2"   DILATION AND CURETTAGE OF UTERUS  1973   EP IMPLANTABLE DEVICE N/A 03/25/2015   MDT Advisa DR pacemaker implanted by Dr Rayann Heman for transient complete heart block and syncope   ESOPHAGOGASTRODUODENOSCOPY (EGD) WITH PROPOFOL N/A 03/14/2021   Procedure: ESOPHAGOGASTRODUODENOSCOPY (EGD) WITH PROPOFOL;  Surgeon: Ronnette Juniper, MD;  Location: [place];  Service: Gastroenterology;  Laterality: N/A;   ESOPHAGOGASTRODUODENOSCOPY (EGD) WITH PROPOFOL N/A 11/13/2021   Procedure: ESOPHAGOGASTRODUODENOSCOPY (EGD) WITH PROPOFOL;  Surgeon: Otis Brace, MD;  Location: [facility];  Service: Gastroenterology;  Laterality: N/A;   HEMOSTASIS CLIP PLACEMENT  03/14/2021   Procedure: HEMOSTASIS CLIP PLACEMENT;  Surgeon: Ronnette Juniper, MD;  Location: [facility];  Service: Gastroenterology;;   INSERT / REPLACE / [place] CATH AND CORONARY ANGIOGRAPHY N/A 11/11/2021   Procedure: LEFT HEART CATH AND CORONARY ANGIOGRAPHY;  Surgeon: Leonie Man, MD;  Location: [place] [facility];  Service: Cardiovascular;  Laterality: N/A;   LEFT HEART CATH AND CORS/GRAFTS ANGIOGRAPHY N/A 02/21/2018   Procedure: LEFT HEART CATH AND CORS/GRAFTS ANGIOGRAPHY;  Surgeon: Lorretta Harp, MD;  Location: [place] [facility];  Service: Cardiovascular;  Laterality: N/A;   LEFT HEART CATH AND CORS/GRAFTS ANGIOGRAPHY N/A 03/08/2021   Procedure: LEFT HEART CATH AND CORS/GRAFTS ANGIOGRAPHY;  Surgeon: [name], Peter M, MD;  Location: [place] [facility];  Service: Cardiovascular;  Laterality: N/A;   PORT-A-CATH REMOVAL N/A 06/12/2013   Procedure: REMOVAL PORT-A-CATH;  Surgeon: Rolm Bookbinder, MD;  Location: [facility];  Service: General;  Laterality: N/A;   PORTACATH PLACEMENT  06/04/2012   Procedure: INSERTION PORT-A-CATH;  Surgeon: Rolm Bookbinder, MD;  Location: [place];  Service: General;  Laterality: N/A;  Insertion of port-a-cath    POSTERIOR LAMINECTOMY / DECOMPRESSION CERVICAL  SPINE  1990s   SCLEROTHERAPY  03/14/2021   Procedure: SCLEROTHERAPY;  Surgeon: Ronnette Juniper, MD;  Location: [facility] END[facility];  Service: Gastroenterology;;   VAGINAL HYSTERECTOMY      FAMILY HISTORY: Family History  Problem Relation Age of Onset   Heart attack Father    Heart disease Father    Heart attack Brother 73   Cancer Mother        lung   Stroke Neg Hx     SOCIAL HISTORY: Social History   Socioeconomic History   Marital status: Married    Spouse name: Not on file   Number of children: 2   Years of education: 12   Highest education level: Not on file  Occupational History   Occupation: Retired  Tobacco Use   Smoking status: Former    Packs/day: 0.30    Years: 20.00    Additional pack years: 0.00    Total pack years: 6.00    Types: Cigarettes    Quit date: 06/06/1981    Years since quitting: 41.3   Smokeless tobacco: Never  Vaping Use   Vaping Use: Never used  Substance and Sexual Activity   Alcohol use: Yes    Alcohol/week: 1.0 standard drink of alcohol    Types: 1 Glasses of wine per week    Comment: occ   Drug use: No   Sexual activity: Not Currently    Birth control/protection: Surgical  Other Topics Concern   Not on file  Social History Narrative   Patient is married with 2 children.   Patient is right handed.   Patient has a high school education with some college education.   Patient drinks 2 cups daily.   Social Determinants of Health   Financial Resource Strain: Not on file  Food Insecurity: Not on file  Transportation Needs: Not on file  Physical Activity: Not on file  Stress: Not on file  Social Connections: Not on file  Intimate Partner Violence: Not on file    PHYSICAL EXAM  Vitals:   09/14/22 0936  BP: (!) 154/74  Pulse: 80  Weight: 178 lb (80.7 kg)  Height: '5\' 7"'$  (1.702 m)   Body mass  index is 27.88 kg/m.    09/14/2022   10:28 AM  Montreal Cognitive Assessment   Visuospatial/ Executive (0/5) 2  Naming (0/3) 3  Attention: Read list of digits (0/2) 2  Attention: Read list of letters (0/1) 1  Attention: Serial 7 subtraction starting at 100 (0/3) 1  Language: Repeat phrase (0/2) 1  Language : Fluency (0/1) 1  Abstraction (0/2) 2  Delayed Recall (0/5) 3  Orientation (0/6) 2  Total 18   Generalized: Well developed, in no acute distress, tired Neurological examination  Mentation: Alert oriented to time, place, husband provides most history, he has to correct her. Follows all commands speech and language fluent, quiet  Cranial nerve II-XII: Pupils were equal round reactive to light. Extraocular movements were full, visual field were full on confrontational test. Facial sensation and strength were normal.  Head turning and shoulder shrug  were normal and symmetric. Motor: The motor testing reveals 5 over 5 strength of all 4 extremities. Good symmetric motor tone is noted throughout.  Sensory: Sensory testing is intact to soft touch on all 4 extremities. No evidence of extinction is noted.  Coordination: Cerebellar testing reveals good finger-nose-finger and heel-to-shin bilaterally.  Gait and station: Gait is normal.  Reflexes: Deep tendon reflexes are symmetric but decreased  DIAGNOSTIC DATA (LABS,  IMAGING, TESTING) - I reviewed patient records, labs, notes, testing and imaging myself where available.  Lab Results  Component Value Date   WBC 6.0 01/06/2022   HGB 12.3 01/06/2022   HCT 37.4 01/06/2022   MCV 84.8 01/06/2022   PLT 361 01/06/2022      Component Value Date/Time   NA 133 (L) 01/06/2022 0900   NA 135 10/19/2021 0941   NA 137 07/11/2016 0820   K 3.4 (L) 01/06/2022 0900   K 4.4 07/11/2016 0820   CL 97 (L) 01/06/2022 0900   CL 106 12/25/2012 0924   CO2 25 01/06/2022 0900   CO2 27 07/11/2016 0820   GLUCOSE 112 (H) 01/06/2022 0900   GLUCOSE 115  07/11/2016 0820   GLUCOSE 105 (H) 12/25/2012 0924   BUN 16 01/06/2022 0900   BUN 12 10/19/2021 0941   BUN 15.6 07/11/2016 0820   CREATININE 0.77 01/06/2022 0900   CREATININE 0.8 07/11/2016 0820   CALCIUM 8.8 (L) 01/06/2022 0900   CALCIUM 8.9 07/11/2016 0820   PROT 7.0 03/27/2022 1049   PROT 7.1 07/11/2016 0820   ALBUMIN 4.4 03/27/2022 1049   ALBUMIN 3.6 07/11/2016 0820   AST 10 03/27/2022 1049   AST 13 07/11/2016 0820   ALT 10 03/27/2022 1049   ALT 14 07/11/2016 0820   ALKPHOS 74 03/27/2022 1049   ALKPHOS 89 07/11/2016 0820   BILITOT 0.2 03/27/2022 1049   BILITOT 0.29 07/11/2016 0820   GFRNONAA >60 01/06/2022 0900   GFRAA 97 08/17/2020 1111   Lab Results  Component Value Date   CHOL 172 03/27/2022   HDL 95 03/27/2022   LDLCALC 60 03/27/2022   TRIG 99 03/27/2022   CHOLHDL 1.8 03/27/2022   Lab Results  Component Value Date   HGBA1C 5.5 02/20/2018   Lab Results  Component Value Date   VITAMINB12 202 11/12/2021   Lab Results  Component Value Date   TSH 1.782 11/10/2021    Butler Denmark, AGNP-C, DNP 09/14/2022, 9:59 AM [facility] [address] [place], [place] [address] [phone]

## 2022-09-14 NOTE — Telephone Encounter (Signed)
Referral for neuropsychology fax to [facility]. Phone: [phone], Fax: [phone]

## 2022-09-15 LAB — CBC WITH DIFFERENTIAL/PLATELET
Basophils Absolute: 0 10*3/uL (ref 0.0–0.2)
Basos: 0 %
EOS (ABSOLUTE): 0.1 10*3/uL (ref 0.0–0.4)
Eos: 1 %
Hematocrit: 34.7 % (ref 34.0–46.6)
Hemoglobin: 10.8 g/dL — ABNORMAL LOW (ref 11.1–15.9)
Immature Grans (Abs): 0 10*3/uL (ref 0.0–0.1)
Immature Granulocytes: 0 %
Lymphocytes Absolute: 1.1 10*3/uL (ref 0.7–3.1)
Lymphs: 18 %
MCH: 24.8 pg — ABNORMAL LOW (ref 26.6–33.0)
MCHC: 31.1 g/dL — ABNORMAL LOW (ref 31.5–35.7)
MCV: 80 fL (ref 79–97)
Monocytes Absolute: 0.5 10*3/uL (ref 0.1–0.9)
Monocytes: 8 %
Neutrophils Absolute: 4.5 10*3/uL (ref 1.4–7.0)
Neutrophils: 73 %
Platelets: 395 10*3/uL (ref 150–450)
RBC: 4.36 x10E6/uL (ref 3.77–5.28)
RDW: 13.3 % (ref 11.7–15.4)
WBC: 6.2 10*3/uL (ref 3.4–10.8)

## 2022-09-15 LAB — COMPREHENSIVE METABOLIC PANEL
ALT: 11 IU/L (ref 0–32)
AST: 14 IU/L (ref 0–40)
Albumin/Globulin Ratio: 1.4 (ref 1.2–2.2)
Albumin: 4 g/dL (ref 3.8–4.8)
Alkaline Phosphatase: 83 IU/L (ref 44–121)
BUN/Creatinine Ratio: 17 (ref 12–28)
BUN: 13 mg/dL (ref 8–27)
Bilirubin Total: <0.2 mg/dL (ref 0.0–1.2)
CO2: 22 mmol/L (ref 20–29)
Calcium: 8.9 mg/dL (ref 8.7–10.3)
Chloride: 98 mmol/L (ref 96–106)
Creatinine, Ser: 0.77 mg/dL (ref 0.57–1.00)
Globulin, Total: 2.8 g/dL (ref 1.5–4.5)
Glucose: 104 mg/dL — ABNORMAL HIGH (ref 70–99)
Potassium: 4.5 mmol/L (ref 3.5–5.2)
Sodium: 135 mmol/L (ref 134–144)
Total Protein: 6.8 g/dL (ref 6.0–8.5)
eGFR: 81 mL/min/{1.73_m2} (ref >59)

## 2022-09-15 LAB — ETHOSUXIMIDE LEVEL: Ethosuximide Lvl: 53 ug/mL (ref 40–100)

## 2022-09-15 LAB — CARBAMAZEPINE LEVEL, TOTAL: Carbamazepine (Tegretol), S: 7.8 ug/mL (ref 4.0–12.0)

## 2022-09-16 ENCOUNTER — Other Ambulatory Visit: Payer: Self-pay | Admitting: Diagnostic Neuroimaging

## 2022-09-18 ENCOUNTER — Ambulatory Visit: Payer: Medicare Other | Admitting: Podiatry

## 2022-09-18 NOTE — Telephone Encounter (Signed)
Pt's husband called and LVM stating that [facility] has sent in the request for this medication and now the pt is down to two left and he would like to know when this request will be approved. Please advise.

## 2022-09-19 ENCOUNTER — Telehealth: Payer: Self-pay | Admitting: Neurology

## 2022-09-19 MED ORDER — CARBAMAZEPINE ER 200 MG PO TB12: 600.0000 mg | ORAL_TABLET | Freq: Two times a day (BID) | ORAL | 4 refills | Status: DC

## 2022-09-19 NOTE — Telephone Encounter (Signed)
Please call, labs look fine, shows stable low HGB at 10.8, otherwise unremarkable. We had discussed reducing carbamazepine from 600 mg BID to 400 mg BID. If agreeable, we can reduce see if any improvement in cognition, sleepiness, also seizure free for 50 years. If agreeable to decreasing, drop the AM dose 1st for 2 weeks, then drop the evening dose, then stay on 400 mg BID. Thanks

## 2022-09-19 NOTE — Telephone Encounter (Signed)
I called patient, spoke with patient's husband Jeneen Rinks, per DPR. [facility] cannot find the carbamazepine RX from 09/14/22. I have resent it to [facility] with the original RX until they decide if the reduction is working, as discussed in another telephone note from 09/19/22.

## 2022-09-19 NOTE — Telephone Encounter (Signed)
Pt husband called and stated pt needs a refill on carbamazepine (TEGRETOL XR) 200 MG 12 hr tablet. Refill should be sent to [phone]. Pt is requesting a phone call after refill is sent.

## 2022-09-19 NOTE — Telephone Encounter (Signed)
I called patient. I discussed this with her husband and patient. They are agreeable to reducing the carbamazepine dosage as recommended and will keep [place] updated. Pt verbalized understanding of results. Pt had no questions at this time but was encouraged to call back if questions arise.

## 2022-09-24 DIAGNOSIS — R410 Disorientation, unspecified: Secondary | ICD-10-CM | POA: Diagnosis not present

## 2022-09-25 ENCOUNTER — Telehealth: Payer: Self-pay | Admitting: Cardiology

## 2022-09-25 ENCOUNTER — Telehealth: Payer: Self-pay | Admitting: Diagnostic Neuroimaging

## 2022-09-25 ENCOUNTER — Ambulatory Visit (INDEPENDENT_AMBULATORY_CARE_PROVIDER_SITE_OTHER): Payer: Medicare Other | Admitting: Diagnostic Neuroimaging

## 2022-09-25 ENCOUNTER — Encounter: Payer: Self-pay | Admitting: Diagnostic Neuroimaging

## 2022-09-25 ENCOUNTER — Other Ambulatory Visit: Payer: Self-pay | Admitting: Neurology

## 2022-09-25 VITALS — BP 102/62 | HR 115 | Ht 67.0 in | Wt 180.0 lb

## 2022-09-25 DIAGNOSIS — G253 Myoclonus: Secondary | ICD-10-CM

## 2022-09-25 DIAGNOSIS — I951 Orthostatic hypotension: Secondary | ICD-10-CM

## 2022-09-25 DIAGNOSIS — R413 Other amnesia: Secondary | ICD-10-CM

## 2022-09-25 DIAGNOSIS — R569 Unspecified convulsions: Secondary | ICD-10-CM

## 2022-09-25 DIAGNOSIS — I25110 Atherosclerotic heart disease of native coronary artery with unstable angina pectoris: Secondary | ICD-10-CM

## 2022-09-25 DIAGNOSIS — E785 Hyperlipidemia, unspecified: Secondary | ICD-10-CM

## 2022-09-25 DIAGNOSIS — I10 Essential (primary) hypertension: Secondary | ICD-10-CM

## 2022-09-25 DIAGNOSIS — Z5181 Encounter for therapeutic drug level monitoring: Secondary | ICD-10-CM | POA: Diagnosis not present

## 2022-09-25 DIAGNOSIS — R55 Syncope and collapse: Secondary | ICD-10-CM

## 2022-09-25 NOTE — Telephone Encounter (Signed)
Called patient's husband left Dr.Jordan's advice on personal voice mail.

## 2022-09-25 NOTE — Progress Notes (Signed)
[facility]  PATIENT: Rachel Clark DOB: April 06, 1951  REFERRING CLINICIAN: Harlan Stains, MD HISTORY FROM: patient REASON FOR VISIT: follow up (urgent)   HISTORICAL  CHIEF COMPLAINT:  Chief Complaint  Patient presents with   Follow-up    Rm 7, pt with husband, yesterday she started having jerking motions upper body shoulders and bilat arms. Fell twice yesterday, never hit head. Balance is off and has worsened over the last couple days. EMT's came last night and thought it would be worth checking out. She has had memory declining and she the neuropsych evaluation is ordered and has a upcoming apt scheduled. Pt complains of dizziness worsening when standing.    Other    Had patient stand to take BP and while standing witnessed her body have a jerking motion mostly upper body and pt stated felt really dizzy. Automatic cuff wouldn't read, checked manually BP was 102/64    HISTORY OF PRESENT ILLNESS:   UPDATE (09/25/22, VRP): Since last visit, stable until yesterday. New onset of confusion, muscle jerks. She did not reduce her CBZ dosing as per Rachel Clark recommendation on 09/19/22 phone call. 09/25/22. Some lightheadedness, dizziness.   UPDATE (09/13/21, VRP): Since last visit, doing well. Symptoms are stable. No more seizures. Memory loss stable. No major changes in ADLs. No more syncope attacks.   PRIOR HPI (12/21/20, Rachel Clark): Rachel Clark is a 72 year old right-handed white female with a history of seizures since she was a child, she has not had a seizure in over 50 years, she is on carbamazepine and Zarontin, she has never wished to try to get off of her medication.  Her son also has a history of seizures.  Since August 2019 she has had low sodium levels.  She has been on a combination of carbamazepine and Cymbalta, but she has been on this for quite a number of years without any sodium level problems.  The patient has more recently had significant issues with  orthostatic hypotension.  She does have a monoclonal antibody and is followed through hematology.  She has not had a demonstrated peripheral neuropathy on nerve conduction studies in the past.  The patient however has had significant issues with being dizzy with standing, she has had multiple syncopal episodes, the most recent was 2 days ago.  The patient has been to the emergency room on 04 September 2020 after she hit her head with a blackout.  She went to the emergency room on 10 Nov 2020 with another significant blackout.  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  The patient is followed by Rachel Clark.  Blood pressure medications have been reduced in dosing.  The patient has gone from 10 mg a day to 2.5 mg and Norvasc, and the Coreg dose was cut in half.  The patient has been told to increase fluids and salt.  She does have compression stockings.  The patient has had some decline in memory over time.  She is no longer driving mainly due to the dizziness issue.  The patient has had increasing problems with remembering recipes with cooking, she is still managing her medications.  Her husband helps her with the appointments.  The patient is repeating herself frequently throughout the day.  The patient claims that she feels bored, isolated.  She sleeps quite a bit throughout the day.  She comes to this office for further evaluation.  The patient does have a  prior history of depression which is still an issue for her.  REVIEW OF SYSTEMS: Full 14 system review of systems performed and negative with exception of: as per HPI.  ALLERGIES: Allergies  Allergen Reactions   Sulfamethoxazole Anaphylaxis    Don't recall   Aspirin Other (See Comments)    GI upset- can tolerate 81 mg ASA, just not full doses   Crestor [Rosuvastatin]     myalgias   Erythromycin Nausea Only    Gi upset   Lisinopril Cough   Niacin And Related Other (See Comments)     Don't recall   Penicillin G Benzathine     Other reaction(s): Unknown   Statins Other (See Comments)    Muscle pain   Sulfa Drugs Cross Reactors Swelling    Don't recall   Tetracyclines & Related Nausea Only   Xeloda [Capecitabine] Diarrhea   Penicillins Nausea Only and Rash        Plavix [Clopidogrel Bisulfate] Rash    HOME MEDICATIONS: Outpatient Medications Prior to Visit  Medication Sig Dispense Refill   acetaminophen (TYLENOL) 500 MG tablet Take 500-1,000 mg by mouth daily as needed for mild pain or headache.     aspirin 81 MG chewable tablet Chew 1 tablet (81 mg total) by mouth daily.     Azelastine HCl 0.15 % SOLN Place 1 spray into both nostrils daily as needed (seasonal allergies).  12   Bempedoic Acid-Ezetimibe (NEXLIZET) 180-10 MG TABS Take 180 mg by mouth daily. 90 tablet 3   calcium carbonate (TUMS - DOSED IN MG ELEMENTAL CALCIUM) 500 MG chewable tablet Chew 500 mg by mouth daily.     carbamazepine (TEGRETOL XR) 200 MG 12 hr tablet Take 3 tablets (600 mg total) by mouth 2 (two) times daily. 540 tablet 4   Cholecalciferol (VITAMIN D) 50 MCG (2000 UT) CAPS Take 4,000 Units by mouth daily.     Cholecalciferol 50 MCG (2000 UT) TABS 2 tablets Orally Once a day for 30 day(s)     diphenhydrAMINE (BENADRYL) 25 MG tablet Take 25 mg by mouth daily.     diphenhydramine-acetaminophen (TYLENOL PM) 25-500 MG TABS tablet Take 1 tablet by mouth at bedtime.     DULoxetine (CYMBALTA) 60 MG capsule Take 60 mg by mouth 2 (two) times daily.     ethosuximide (ZARONTIN) 250 MG capsule TAKE 2 CAPSULES IN THE MORNING, TAKE 2 CAPSULES AT NIGHT 360 capsule 3   Evolocumab with Infusor ([place]) 420 MG/3.5ML SOCT Inject 420 mg into the skin every 30 (thirty) days. 3.6 mL 11   fluticasone (FLONASE) 50 MCG/ACT nasal spray Place 1 spray into both nostrils daily as needed for allergies.     isosorbide mononitrate (IMDUR) 30 MG 24 hr tablet TAKE HALF TABLET (15MG  TOTAL) BY MOUTH EVERY  DAY 30 tablet 3   levothyroxine (SYNTHROID, LEVOTHROID) 50 MCG tablet Take 50 mcg by mouth daily before breakfast.     memantine (NAMENDA) 10 MG tablet Take 1 tablet (10 mg total) by mouth 2 (two) times daily. 180 tablet 4   nitroGLYCERIN (NITROSTAT) 0.4 MG SL tablet Place 1 tablet (0.4 mg total) under the tongue every 5 (five) minutes as needed. For chest pain. 25 tablet 11   pantoprazole (PROTONIX) 40 MG tablet TAKE ONE TABLET BY MOUTH TWICE DAILY FOR 30 DAYS THEN TAKE ONE TABLET BY MOUTH DAILY 120 tablet 2   prasugrel (EFFIENT) 10 MG TABS tablet Take 1 tablet (10 mg total) by mouth daily. 90 tablet 3  No facility-administered medications prior to visit.      PHYSICAL EXAM   GENERAL EXAM/CONSTITUTIONAL: Vitals:  Vitals:   09/25/22 1340 09/25/22 1347  BP: (!) 99/52 102/62  Pulse: 62 (!) 115  Weight: 180 lb (81.6 kg)   Height: 5\' 7"  (1.702 m)    Body mass index is 28.19 kg/m. Wt Readings from Last 3 Encounters:  09/25/22 180 lb (81.6 kg)  09/14/22 178 lb (80.7 kg)  06/30/22 178 lb 9.2 oz (81 kg)   Patient is in no distress; well developed, nourished and groomed; neck is supple  CARDIOVASCULAR: Examination of carotid arteries is normal; no carotid bruits Regular rate and rhythm, no murmurs Examination of peripheral vascular system by observation and palpation is normal  EYES: Ophthalmoscopic exam of optic discs and posterior segments is normal; no papilledema or hemorrhages No results found.  MUSCULOSKELETAL: Gait, strength, tone, movements noted in Neurologic exam below  NEUROLOGIC: MENTAL STATUS:     12/21/2020    7:21 AM 08/17/2020   10:04 AM 08/18/2019    1:55 PM  MMSE - Mini Mental State Exam  Orientation to time 2 3 2   Orientation to Place 4 4 3   Registration 3 3 3   Attention/ Calculation 1 5 5   Recall 0 3 1  Language- name 2 objects 2 2 2   Language- repeat 1 1 1   Language- follow 3 step command 3 3 3   Language- read & follow direction 1 1 1   Write a  sentence 1 1 1   Copy design 1 1 1   Total score 19 27 23    awake, alert, oriented to person, place and time recent and remote memory intact normal attention and concentration language fluent, comprehension intact, naming intact fund of knowledge appropriate  CRANIAL NERVE:  2nd - no papilledema on fundoscopic exam 2nd, 3rd, 4th, 6th - pupils equal and reactive to light, visual fields full to confrontation, extraocular muscles intact, no nystagmus 5th - facial sensation symmetric 7th - facial strength symmetric 8th - hearing intact 9th - palate elevates symmetrically, uvula midline 11th - shoulder shrug symmetric 12th - tongue protrusion midline  MOTOR:  INTERMITTENT MYOCLONUS (FACE, HEAD, SHOULDERS) normal bulk and tone, full strength in the BUE, BLE  SENSORY:  normal and symmetric to light touch, temperature, vibration  COORDINATION:  finger-nose-finger, fine finger movements normal  REFLEXES:  deep tendon reflexes TRACE and symmetric  GAIT/STATION:  narrow based gait     DIAGNOSTIC DATA (LABS, IMAGING, TESTING) - I reviewed patient records, labs, notes, testing and imaging myself where available.  Lab Results  Component Value Date   WBC 6.2 09/14/2022   HGB 10.8 (L) 09/14/2022   HCT 34.7 09/14/2022   MCV 80 09/14/2022   PLT 395 09/14/2022      Component Value Date/Time   NA 135 09/14/2022 1046   NA 137 07/11/2016 0820   K 4.5 09/14/2022 1046   K 4.4 07/11/2016 0820   CL 98 09/14/2022 1046   CL 106 12/25/2012 0924   CO2 22 09/14/2022 1046   CO2 27 07/11/2016 0820   GLUCOSE 104 (H) 09/14/2022 1046   GLUCOSE 112 (H) 01/06/2022 0900   GLUCOSE 115 07/11/2016 0820   GLUCOSE 105 (H) 12/25/2012 0924   BUN 13 09/14/2022 1046   BUN 15.6 07/11/2016 0820   CREATININE 0.77 09/14/2022 1046   CREATININE 0.8 07/11/2016 0820   CALCIUM 8.9 09/14/2022 1046   CALCIUM 8.9 07/11/2016 0820   PROT 6.8 09/14/2022 1046   PROT 7.1 07/11/2016  0820   ALBUMIN 4.0 09/14/2022  1046   ALBUMIN 3.6 07/11/2016 0820   AST 14 09/14/2022 1046   AST 13 07/11/2016 0820   ALT 11 09/14/2022 1046   ALT 14 07/11/2016 0820   ALKPHOS 83 09/14/2022 1046   ALKPHOS 89 07/11/2016 0820   BILITOT <0.2 09/14/2022 1046   BILITOT 0.29 07/11/2016 0820   GFRNONAA >60 01/06/2022 0900   GFRAA 97 08/17/2020 1111   Lab Results  Component Value Date   CHOL 172 03/27/2022   HDL 95 03/27/2022   LDLCALC 60 03/27/2022   TRIG 99 03/27/2022   CHOLHDL 1.8 03/27/2022   Lab Results  Component Value Date   HGBA1C 5.5 02/20/2018   Lab Results  Component Value Date   VITAMINB12 202 11/12/2021   Lab Results  Component Value Date   TSH 1.782 11/10/2021    09/04/20 CT head  1. No acute intracranial abnormality. 2. LEFT forehead subcutaneous hematoma.    ASSESSMENT AND PLAN  72 y.o. year old female here with:   Dx:  1. Myoclonus      PLAN:  NEW ONSET MYOCLONUS, MEMORY LOSS, CONFUSION,  FATIGUE (since 09/24/22) - check CT head, EEG, labs - continue carbamazepine 600mg  twice a day + ethosuximide 500mg  at bedtime  Orders Placed This Encounter  Procedures   CT HEAD WO CONTRAST (5MM)   CBC with Differential/Platelet   Comprehensive metabolic panel   Carbamazepine level, total   Ammonia   Vitamin B12   EEG adult   Return in about 6 weeks (around 11/06/2022) for MyChart visit (15 min).    Penni Bombard, MD [id number], [id number] PM Certified in Neurology, Neurophysiology and Neuroimaging  [facility] [address], [place], [place] [address] [phone]

## 2022-09-25 NOTE — Telephone Encounter (Signed)
  Pt's husband calling, requesting to speak with Dr. Doug Sou nurse. He said, its regarding Dr. Leta Baptist 's recommendations

## 2022-09-25 NOTE — Telephone Encounter (Signed)
Pt husband called. Stated pt needed to be seen today because on Saturday she started jerking her body back and fourth and fell twice. Stated he called EMT and her vital signs where normal. Stated they told him it was Neurological, he stated he didn't want to go follow-up at the ER and stay there all night long. He scheduled pt an office visit to see Dr. Leta Baptist today at 1:30pm.

## 2022-09-25 NOTE — Telephone Encounter (Signed)
medicare/[place] sup NPR sent to [facility] [phone] 

## 2022-09-25 NOTE — Telephone Encounter (Signed)
Spoke to patient's husband stated wife saw neurologist Dr.Penumalli today due to jerky movements in shoulders,legs,light headed.Stated he ordered a head ct.He wanted to let Dr.Jordan know her B/P was alittle low 102/62.She has appointment with Caron Presume PA 4/10 at 11:20 am.Advised to check B/P daily and bring a list of readings to appointment.I will make Dr.Jordan aware.

## 2022-09-26 ENCOUNTER — Telehealth: Payer: Self-pay

## 2022-09-26 LAB — VITAMIN B12: Vitamin B-12: 625 pg/mL (ref 232–1245)

## 2022-09-26 LAB — COMPREHENSIVE METABOLIC PANEL
ALT: 13 IU/L (ref 0–32)
AST: 16 IU/L (ref 0–40)
Albumin/Globulin Ratio: 1.4 (ref 1.2–2.2)
Albumin: 4 g/dL (ref 3.8–4.8)
Alkaline Phosphatase: 84 IU/L (ref 44–121)
BUN/Creatinine Ratio: 21 (ref 12–28)
BUN: 16 mg/dL (ref 8–27)
Bilirubin Total: <0.2 mg/dL (ref 0.0–1.2)
CO2: 23 mmol/L (ref 20–29)
Calcium: 9 mg/dL (ref 8.7–10.3)
Chloride: 97 mmol/L (ref 96–106)
Creatinine, Ser: 0.77 mg/dL (ref 0.57–1.00)
Globulin, Total: 2.9 g/dL (ref 1.5–4.5)
Glucose: 120 mg/dL — ABNORMAL HIGH (ref 70–99)
Potassium: 4.8 mmol/L (ref 3.5–5.2)
Sodium: 132 mmol/L — ABNORMAL LOW (ref 134–144)
Total Protein: 6.9 g/dL (ref 6.0–8.5)
eGFR: 81 mL/min/{1.73_m2} (ref >59)

## 2022-09-26 LAB — CBC WITH DIFFERENTIAL/PLATELET
Basophils Absolute: 0 10*3/uL (ref 0.0–0.2)
Basos: 1 %
EOS (ABSOLUTE): 0.1 10*3/uL (ref 0.0–0.4)
Eos: 1 %
Hematocrit: 32.3 % — ABNORMAL LOW (ref 34.0–46.6)
Hemoglobin: 10.5 g/dL — ABNORMAL LOW (ref 11.1–15.9)
Immature Grans (Abs): 0 10*3/uL (ref 0.0–0.1)
Immature Granulocytes: 0 %
Lymphocytes Absolute: 1.6 10*3/uL (ref 0.7–3.1)
Lymphs: 19 %
MCH: 24.5 pg — ABNORMAL LOW (ref 26.6–33.0)
MCHC: 32.5 g/dL (ref 31.5–35.7)
MCV: 76 fL — ABNORMAL LOW (ref 79–97)
Monocytes Absolute: 0.6 10*3/uL (ref 0.1–0.9)
Monocytes: 8 %
Neutrophils Absolute: 5.7 10*3/uL (ref 1.4–7.0)
Neutrophils: 71 %
Platelets: 457 10*3/uL — ABNORMAL HIGH (ref 150–450)
RBC: 4.28 x10E6/uL (ref 3.77–5.28)
RDW: 13.4 % (ref 11.7–15.4)
WBC: 8 10*3/uL (ref 3.4–10.8)

## 2022-09-26 LAB — CARBAMAZEPINE LEVEL, TOTAL: Carbamazepine (Tegretol), S: 3.9 ug/mL — ABNORMAL LOW (ref 4.0–12.0)

## 2022-09-26 LAB — AMMONIA: Ammonia: 41 ug/dL (ref 31–169)

## 2022-09-26 NOTE — Telephone Encounter (Signed)
-----   Message from Penni Bombard, MD sent at 09/26/2022 11:56 AM EDT ----- CBZ level is low. Please discuss with patient and husband to see if she is taking CBZ 600mg  twice a day correctly. Other labs stable. -VRP

## 2022-09-26 NOTE — Telephone Encounter (Signed)
Contacted pt, spoke to spouse per DPR, regarding labs, informed her CBZ level is low but all other labs are stable. Asked pt if she is taking CBZ 600mg  twice a day correctly, they stated she was out of the medication for a little bit and she was taking her sons. I advised them if they are out, we can always sent it to another pharmacy. Please do not take other people medications. They did it get it filled and will be taking the correct dose. They verbally understood my advisement and was appreciative. Marland Kitchen

## 2022-10-09 ENCOUNTER — Ambulatory Visit (HOSPITAL_BASED_OUTPATIENT_CLINIC_OR_DEPARTMENT_OTHER)
Admission: RE | Admit: 2022-10-09 | Discharge: 2022-10-09 | Disposition: A | Discharge status: Home / Self Care | Payer: Medicare Other | Source: Ambulatory Visit | Attending: Diagnostic Neuroimaging | Admitting: Diagnostic Neuroimaging

## 2022-10-09 DIAGNOSIS — R519 Headache, unspecified: Secondary | ICD-10-CM | POA: Diagnosis not present

## 2022-10-09 DIAGNOSIS — G253 Myoclonus: Secondary | ICD-10-CM | POA: Insufficient documentation

## 2022-10-09 NOTE — Progress Notes (Signed)
Cardiology Office Note:    Date:  10/11/2022   ID:  Rachel Clark, DOB 18-Nov-1950, MRN [id number]  PCP:  Laurann Montana, MD [facility] Cardiologist: Peter Swaziland, MD     Reason for visit: 6 month follow-up  History of Present Illness:    Rachel Clark is a 72 y.o. female with a hx of CAD (remote NSTEMI s/p PCI to RCA, s/p CABG 2007, subsequent failure of SVG-RCA by cath in 2016, DESx3 - full metal jacket- to RCA in 11/2015, NSTEMI 03/2021 with DESx3 to ramus intermedius), DES to Lcx 10/2021, CHB/syncope s/p PPM in 03/2015, recurrent syncope/dizziness then felt due to orthostasis, GIB 03/2021 s/p transfusion with gastric antrum vessel s/p epi/clipping, HL with statin intolerance, colon CA s/p resection and chemotherapy, seizures, memory loss, chronic hyponatremia, fibromyalgia, GERD, hypothyroidism, former smoker.    I last saw patient in 12/2021.  She was doing well other than chronic fatigue, sedentary lifestyle secondary to nervousness of orthostatic hypotension, cognitive impairment.  She was doing well without chest pain or shortness of breath.  She had stable chronic lightheadedness and continued to wear compression stockings.  I recommended cardiac rehab to help her stamina.  Today, patient is doing well from a heart standpoint.  She has not had any presyncope/syncopal episodes.  They state Dr. Elvis Coil goal systolic blood pressure for her is 120-1 0s.  She is there 80% the time.  Systolic blood pressure is always above 110.  She has rare lightheadedness.  She wears compression stockings.  Cholesterol has improved with Repatha -she had repeat lipids drawn this morning, awaiting results.  She continues on Effient and baby aspirin with no signs of bleeding.  Her husband mentions recent jerking episodes.  She is awaiting results from EEG and head CT done this week.    Past Medical History:  Diagnosis Date   Arthritis    "fingers" (06/29/2016)   Chronic lower back pain     Colon cancer 12/04/2011   s/p Laparoscopic-assisted transverse colectomy on 12/19/2011 by Dr. Dwain Sarna.  pT3 N0 M0.    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   last seizure was in 1972 ;takes Tegretol (06/29/2016)    Headache    "weekly" (06/29/2016)   Hyperlipidemia    Hyponatremia    Hypothyroid    Iron deficiency anemia 11/17/2011   Memory change 11/13/2016   Monoclonal gammopathy of unknown significance    Orthostatic hypotension    Presence of permanent cardiac pacemaker    Syncope and collapse    pacemaker implanted    Past Surgical History:  Procedure Laterality Date   ANTERIOR CERVICAL DECOMP/DISCECTOMY FUSION  1980's   BACK SURGERY     CARDIAC CATHETERIZATION  05/20/2009   obstructive native vessel disease in LAD, RCA, and first diagonal, patent vein graft to distal RCA and LIMA to LAD,normal. ef 60%   CARDIAC CATHETERIZATION N/A 03/24/2015   Procedure: Left Heart Cath and Cors/Grafts Angiography;  Surgeon: Iran Ouch, MD;  Location: [facility] INVASIVE CV LAB;  Service: Cardiovascular;  Laterality: N/A;   CARDIAC CATHETERIZATION N/A 07/28/2015   Procedure: Left Heart Cath and Coronary Angiography;  Surgeon: Lennette Bihari, MD;  Location: [facility]VAS[facility] CV LAB;  Service: Cardiovascular;  Laterality: N/A;   CARDIAC CATHETERIZATION  N/A 11/09/2015   Procedure: Coronary Stent Intervention;  Surgeon: Tonny Bollman, MD;  Location: [facility] INVASIVE CV LAB;  Service: Cardiovascular;  Laterality: N/A;   CARDIAC CATHETERIZATION N/A 11/09/2015   Procedure: Left Heart Cath and Coronary Angiography;  Surgeon: Tonny Bollman, MD;  Location: [facility] INVASIVE CV LAB;  Service: Cardiovascular;  Laterality: N/A;   CARDIAC CATHETERIZATION N/A 06/29/2016   Procedure: Left Heart Cath and Coronary Angiography;  Surgeon: Yvonne Kendall, MD;   Location: [facility] INVASIVE CV LAB;  Service: Cardiovascular;  Laterality: N/A;   CARDIAC CATHETERIZATION N/A 06/29/2016   Procedure: Intravascular Pressure Wire/FFR Study;  Surgeon: Yvonne Kendall, MD;  Location: [facility] INVASIVE CV LAB;  Service: Cardiovascular;  Laterality: N/A;   CARDIAC CATHETERIZATION N/A 06/29/2016   Procedure: Coronary Balloon Angioplasty;  Surgeon: Yvonne Kendall, MD;  Location: [facility];  Service: Cardiovascular;  Laterality: N/A;   COLON RESECTION  12/19/2011   Procedure: COLON RESECTION LAPAROSCOPIC;  Surgeon: Emelia Loron, MD;  Location: [facility] OR;  Service: General;  Laterality: N/A;  laparoscopic hand assisted partial colon resection   COLON SURGERY     COLONOSCOPY     CORONARY ANGIOPLASTY WITH STENT PLACEMENT  ~ 2007   1 stent   CORONARY ARTERY BYPASS GRAFT  2007   "CABG X2"   DILATION AND CURETTAGE OF UTERUS  1973   EP IMPLANTABLE DEVICE N/A 03/25/2015   MDT Advisa DR pacemaker implanted by Dr Johney Frame for transient complete heart block and syncope   ESOPHAGOGASTRODUODENOSCOPY (EGD) WITH PROPOFOL N/A 03/14/2021   Procedure: ESOPHAGOGASTRODUODENOSCOPY (EGD) WITH PROPOFOL;  Surgeon: Kerin Salen, MD;  Location: [facility] ENDOSCOPY;  Service: Gastroenterology;  Laterality: N/A;   ESOPHAGOGASTRODUODENOSCOPY (EGD) WITH PROPOFOL N/A 11/13/2021   Procedure: ESOPHAGOGASTRODUODENOSCOPY (EGD) WITH PROPOFOL;  Surgeon: Kathi Der, MD;  Location: [facility] ENDOSCOPY;  Service: Gastroenterology;  Laterality: N/A;   HEMOSTASIS CLIP PLACEMENT  03/14/2021   Procedure: HEMOSTASIS CLIP PLACEMENT;  Surgeon: Kerin Salen, MD;  Location: [facility] ENDOSCOPY;  Service: Gastroenterology;;   INSERT / REPLACE / REMOVE PACEMAKER     LEFT HEART CATH AND CORONARY ANGIOGRAPHY N/A 11/11/2021   Procedure: LEFT HEART CATH AND CORONARY ANGIOGRAPHY;  Surgeon: Marykay Lex, MD;  Location: [facility] INVASIVE CV LAB;  Service: Cardiovascular;  Laterality: N/A;   LEFT HEART CATH AND CORS/GRAFTS ANGIOGRAPHY N/A 02/21/2018    Procedure: LEFT HEART CATH AND CORS/GRAFTS ANGIOGRAPHY;  Surgeon: Runell Gess, MD;  Location: [facility] INVASIVE CV LAB;  Service: Cardiovascular;  Laterality: N/A;   LEFT HEART CATH AND CORS/GRAFTS ANGIOGRAPHY N/A 03/08/2021   Procedure: LEFT HEART CATH AND CORS/GRAFTS ANGIOGRAPHY;  Surgeon: Swaziland, Peter M, MD;  Location: [facility] INVASIVE CV LAB;  Service: Cardiovascular;  Laterality: N/A;   PORT-A-CATH REMOVAL N/A 06/12/2013   Procedure: REMOVAL PORT-A-CATH;  Surgeon: Emelia Loron, MD;  Location: [facility];  Service: General;  Laterality: N/A;   PORTACATH PLACEMENT  06/04/2012   Procedure: INSERTION PORT-A-CATH;  Surgeon: Emelia Loron, MD;  Location: [facility] OR;  Service: General;  Laterality: N/A;  Insertion of port-a-cath    POSTERIOR LAMINECTOMY / DECOMPRESSION CERVICAL SPINE  1990s   SCLEROTHERAPY  03/14/2021   Procedure: SCLEROTHERAPY;  Surgeon: Kerin Salen, MD;  Location: [facility] ENDOSCOPY;  Service: Gastroenterology;;   VAGINAL HYSTERECTOMY      Current Medications: Current Meds  Medication Sig   acetaminophen (TYLENOL) 500 MG tablet Take 500-1,000 mg by mouth daily as needed for mild pain or headache.   aspirin 81 MG chewable tablet Chew 1 tablet (81 mg total) by  mouth daily.   Azelastine HCl 0.15 % SOLN Place 1 spray into both nostrils daily as needed (seasonal allergies).   Bempedoic Acid-Ezetimibe (NEXLIZET) 180-10 MG TABS Take 180 mg by mouth daily.   calcium carbonate (TUMS - DOSED IN MG ELEMENTAL CALCIUM) 500 MG chewable tablet Chew 500 mg by mouth daily.   carbamazepine (TEGRETOL XR) 200 MG 12 hr tablet Take 3 tablets (600 mg total) by mouth 2 (two) times daily.   Cholecalciferol (VITAMIN D) 50 MCG (2000 UT) CAPS Take 4,000 Units by mouth daily.   Cholecalciferol 50 MCG (2000 UT) TABS 2 tablets Orally Once a day for 30 day(s)   diphenhydrAMINE (BENADRYL) 25 MG tablet Take 25 mg by mouth daily.   diphenhydramine-acetaminophen (TYLENOL PM) 25-500 MG TABS tablet Take  1 tablet by mouth at bedtime.   DULoxetine (CYMBALTA) 60 MG capsule Take 60 mg by mouth 2 (two) times daily.   ethosuximide (ZARONTIN) 250 MG capsule TAKE 2 CAPSULES IN THE MORNING, TAKE 2 CAPSULES AT NIGHT   Evolocumab with Infusor (REPATHA PUSHTRONEX SYSTEM) 420 MG/3.5ML SOCT Inject 420 mg into the skin every 30 (thirty) days.   fluticasone (FLONASE) 50 MCG/ACT nasal spray Place 1 spray into both nostrils daily as needed for allergies.   isosorbide mononitrate (IMDUR) 30 MG 24 hr tablet TAKE HALF TABLET (15MG  TOTAL) BY MOUTH EVERY DAY   levothyroxine (SYNTHROID, LEVOTHROID) 50 MCG tablet Take 50 mcg by mouth daily before breakfast.   memantine (NAMENDA) 10 MG tablet Take 1 tablet (10 mg total) by mouth 2 (two) times daily.   nitroGLYCERIN (NITROSTAT) 0.4 MG SL tablet Place 1 tablet (0.4 mg total) under the tongue every 5 (five) minutes as needed. For chest pain.   pantoprazole (PROTONIX) 40 MG tablet TAKE ONE TABLET BY MOUTH TWICE DAILY FOR 30 DAYS THEN TAKE ONE TABLET BY MOUTH DAILY   prasugrel (EFFIENT) 10 MG TABS tablet Take 1 tablet (10 mg total) by mouth daily.     Allergies:   Sulfamethoxazole, Aspirin, Crestor [rosuvastatin], Erythromycin, Lisinopril, Niacin and related, Penicillin g benzathine, Statins, Sulfa drugs cross reactors, Tetracyclines & related, Xeloda [capecitabine], Penicillins, and Plavix [clopidogrel bisulfate]   Social History   Socioeconomic History   Marital status: Married    Spouse name: Not on file   Number of children: 2   Years of education: 12   Highest education level: Not on file  Occupational History   Occupation: Retired  Tobacco Use   Smoking status: Former    Packs/day: 0.30    Years: 20.00    Additional pack years: 0.00    Total pack years: 6.00    Types: Cigarettes    Quit date: 06/06/1981    Years since quitting: 41.3   Smokeless tobacco: Never  Vaping Use   Vaping Use: Never used  Substance and Sexual Activity   Alcohol use: Yes     Alcohol/week: 1.0 standard drink of alcohol    Types: 1 Glasses of wine per week    Comment: occ   Drug use: No   Sexual activity: Not Currently    Birth control/protection: Surgical  Other Topics Concern   Not on file  Social History Narrative   Patient is married with 2 children.   Patient is right handed.   Patient has a high school education with some college education.   Patient drinks 2 cups daily.   Social Determinants of Health   Financial Resource Strain: Not on file  Food Insecurity: Not on file  Transportation Needs: Not on file  Physical Activity: Not on file  Stress: Not on file  Social Connections: Not on file     Family History: The patient's family history includes Cancer in her mother; Heart attack in her father; Heart attack (age of onset: 87) in her brother; Heart disease in her father. There is no history of Stroke.  ROS:   Please see the history of present illness.     EKGs/Labs/Other Studies Reviewed:    Recent Labs: 11/10/2021: TSH 1.782 11/14/2021: Magnesium 2.0 09/25/2022: ALT 13; BUN 16; Creatinine, Ser 0.77; Hemoglobin 10.5; Platelets 457; Potassium 4.8; Sodium 132   Recent Lipid Panel Lab Results  Component Value Date/Time   CHOL 172 03/27/2022 11:32 AM   TRIG 99 03/27/2022 11:32 AM   HDL 95 03/27/2022 11:32 AM   LDLCALC 60 03/27/2022 11:32 AM    Physical Exam:    VS:  BP 130/70   Pulse 99   Ht 5\' 8"  (1.727 m)   Wt 181 lb (82.1 kg)   SpO2 95%   BMI 27.52 kg/m    No data found.   Wt Readings from Last 3 Encounters:  10/11/22 181 lb (82.1 kg)  09/25/22 180 lb (81.6 kg)  09/14/22 178 lb (80.7 kg)     GEN:  Well nourished, well developed in no acute distress HEENT: Normal NECK: No JVD; No carotid bruits CARDIAC: RRR, no murmurs, rubs, gallops RESPIRATORY:  Clear to auscultation without rales, wheezing or rhonchi  ABDOMEN: Soft, non-tender, non-distended MUSCULOSKELETAL: No edema SKIN: Warm and dry NEUROLOGIC:  Alert and  oriented PSYCHIATRIC:  Normal affect     ASSESSMENT AND PLAN   Coronary artery disease with no angina - s/p CABG. S/p extensive stenting of RCA.  in 2017. Recent small NSTEMI with complex stenting of a large ramus intermediate branch with DES x 3 in September 2022.  DES to Lcx in 10/2021. -Recommend indefinite DAPT - On ASA and Effient (interaction of Brilinta with Tegretol; history of rash on Plavix) -Continue Imdur 15 mg daily -BP stable without orthostasis.  GI bleeding and acute blood loss anemia -EGD 10/2021 showed small prepyloric gastric ulcer with no evidence of  active bleeding, some gastritis.  -Treated with Protonix indefinitely.  Hyperlipidemia with LDL goal < 70 -Started Repatha in May 2023 (LDL 126 in April 2023 down to 60 in 03/2022) -On Nexlizet -History of multiple statin intolerance with myalgias -Follow-up on repeat lipids drawn today.   Syncope -Secondary to orthostatic hypotension/and/or neurocardiogenic syncope. Dr. Nelly Laurence (EP) follows device interrogations -Continue compression stockings, salt liberalization & good hydration -Allow more permissive BP.     SSS. S/p pacemaker. Followed in device clinic.    Disposition - Follow-up in with Dr. Swaziland.  She is due for follow-up with Dr. Graciela Husbands -will have the office schedule.   Medication Adjustments/Labs and Tests Ordered: Current medicines are reviewed at length with the patient today.  Concerns regarding medicines are outlined above.  No orders of the defined types were placed in this encounter.  No orders of the defined types were placed in this encounter.   Patient Instructions  Medication Instructions:  No Changes *If you need a refill on your cardiac medications before your next appointment, please call your pharmacy*   Lab Work: No Labs If you have labs (blood work) drawn today and your tests are completely normal, you will receive your results only by: MyChart Message (if you have MyChart) OR A  paper copy in the mail If  you have any lab test that is abnormal or we need to change your treatment, we will call you to review the results.   Testing/Procedures: No Testing   Follow-Up: At [facility], you and your health needs are our priority.  As part of our continuing mission to provide you with exceptional heart care, we have created designated Provider Care Teams.  These Care Teams include your primary Cardiologist (physician) and Advanced Practice Providers (APPs -  Physician Assistants and Nurse Practitioners) who all work together to provide you with the care you need, when you need it.  We recommend signing up for the patient portal called "MyChart".  Sign up information is provided on this After Visit Summary.  MyChart is used to connect with patients for Virtual Visits (Telemedicine).  Patients are able to view lab/test results, encounter notes, upcoming appointments, etc.  Non-urgent messages can be sent to your provider as well.   To learn more about what you can do with MyChart, go to [email or web address].    Your next appointment:   6 month(s)  Provider:   Peter Swaziland, MD        Signed, Cannon Kettle, PA-C  10/11/2022 11:51 AM    [facility]

## 2022-10-10 ENCOUNTER — Ambulatory Visit (INDEPENDENT_AMBULATORY_CARE_PROVIDER_SITE_OTHER): Payer: Medicare Other | Admitting: Diagnostic Neuroimaging

## 2022-10-10 DIAGNOSIS — R253 Fasciculation: Secondary | ICD-10-CM | POA: Diagnosis not present

## 2022-10-10 DIAGNOSIS — G253 Myoclonus: Secondary | ICD-10-CM

## 2022-10-11 ENCOUNTER — Ambulatory Visit: Payer: Medicare Other | Attending: Physician Assistant | Admitting: Physician Assistant

## 2022-10-11 ENCOUNTER — Telehealth: Payer: Self-pay | Admitting: Diagnostic Neuroimaging

## 2022-10-11 ENCOUNTER — Encounter: Payer: Self-pay | Admitting: Physician Assistant

## 2022-10-11 VITALS — BP 130/70 | HR 99 | Ht 68.0 in | Wt 181.0 lb

## 2022-10-11 DIAGNOSIS — I25110 Atherosclerotic heart disease of native coronary artery with unstable angina pectoris: Secondary | ICD-10-CM

## 2022-10-11 DIAGNOSIS — E785 Hyperlipidemia, unspecified: Secondary | ICD-10-CM

## 2022-10-11 DIAGNOSIS — I10 Essential (primary) hypertension: Secondary | ICD-10-CM | POA: Diagnosis not present

## 2022-10-11 NOTE — Telephone Encounter (Signed)
Please advise CT and EEG hasn't been read and resulted yet. EEG just completed yesterday. MD needs to review. Will call when we have results

## 2022-10-11 NOTE — Telephone Encounter (Signed)
Pt husband called. Requesting results on CT and EEG.

## 2022-10-11 NOTE — Patient Instructions (Signed)
Medication Instructions:  No Changes *If you need a refill on your cardiac medications before your next appointment, please call your pharmacy*   Lab Work: No Labs If you have labs (blood work) drawn today and your tests are completely normal, you will receive your results only by: MyChart Message (if you have MyChart) OR A paper copy in the mail If you have any lab test that is abnormal or we need to change your treatment, we will call you to review the results.   Testing/Procedures: No Testing   Follow-Up: At [facility], you and your health needs are our priority.  As part of our continuing mission to provide you with exceptional heart care, we have created designated Provider Care Teams.  These Care Teams include your primary Cardiologist (physician) and Advanced Practice Providers (APPs -  Physician Assistants and Nurse Practitioners) who all work together to provide you with the care you need, when you need it.  We recommend signing up for the patient portal called "MyChart".  Sign up information is provided on this After Visit Summary.  MyChart is used to connect with patients for Virtual Visits (Telemedicine).  Patients are able to view lab/test results, encounter notes, upcoming appointments, etc.  Non-urgent messages can be sent to your provider as well.   To learn more about what you can do with MyChart, go to [email or web address].    Your next appointment:   6 month(s)  Provider:   Peter Swaziland, MD

## 2022-10-12 LAB — LIPID PANEL
Chol/HDL Ratio: 1.9 ratio (ref 0.0–4.4)
Cholesterol, Total: 165 mg/dL (ref 100–199)
HDL: 87 mg/dL (ref >39)
LDL Chol Calc (NIH): 60 mg/dL (ref 0–99)
Triglycerides: 99 mg/dL (ref 0–149)
VLDL Cholesterol Cal: 18 mg/dL (ref 5–40)

## 2022-10-12 LAB — HEPATIC FUNCTION PANEL
ALT: 9 IU/L (ref 0–32)
AST: 9 IU/L (ref 0–40)
Albumin: 4 g/dL (ref 3.8–4.8)
Alkaline Phosphatase: 77 IU/L (ref 44–121)
Bilirubin Total: 0.2 mg/dL (ref 0.0–1.2)
Bilirubin, Direct: <0.1 mg/dL (ref 0.00–0.40)
Total Protein: 6.8 g/dL (ref 6.0–8.5)

## 2022-10-12 LAB — BASIC METABOLIC PANEL
BUN/Creatinine Ratio: 17 (ref 12–28)
BUN: 13 mg/dL (ref 8–27)
CO2: 23 mmol/L (ref 20–29)
Calcium: 9.1 mg/dL (ref 8.7–10.3)
Chloride: 98 mmol/L (ref 96–106)
Creatinine, Ser: 0.78 mg/dL (ref 0.57–1.00)
Glucose: 107 mg/dL — ABNORMAL HIGH (ref 70–99)
Potassium: 4.8 mmol/L (ref 3.5–5.2)
Sodium: 136 mmol/L (ref 134–144)
eGFR: 80 mL/min/{1.73_m2} (ref >59)

## 2022-10-12 NOTE — Telephone Encounter (Signed)
Called pt husband. Informed him that CT and EEG hasn't been read and resulted yet. EEG just completed yesterday. MD needs to review. Will call when we have results. He said that fine, I was just checking.

## 2022-10-16 NOTE — Telephone Encounter (Signed)
Please result EEG when available

## 2022-10-16 NOTE — Telephone Encounter (Signed)
Spouse is asking that he be called with results to both CT and EEG, please call him at [phone]

## 2022-10-17 ENCOUNTER — Encounter: Payer: Self-pay | Admitting: Cardiology

## 2022-10-18 NOTE — Procedures (Signed)
   [facility]  EEG (ELECTROENCEPHALOGRAM) REPORT   STUDY DATE: 10/10/22 PATIENT NAME: Rachel Clark DOB: [birth year] MRN: [id number]  ORDERING CLINICIAN: Joycelyn Schmid, MD   TECHNOLOGIST: Marcheta Grammes TECHNIQUE: Electroencephalogram was recorded utilizing standard 10-20 system of lead placement and reformatted into average and bipolar montages.  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  FINDINGS: Posterior dominant background rhythms, which attenuate with eye opening, ranging 13-14 hertz and 20-30 microvolts. No focal, lateralizing, epileptiform activity or seizures are seen. Patient recorded in the awake and drowsy state. EKG channel shows regular rhythm of 70-75 beats per minute.   IMPRESSION:   Normal EEG in the awake and drowsy states.   INTERPRETING PHYSICIAN:  Suanne Marker, MD Certified in Neurology, Neurophysiology and Neuroimaging  [facility] [address], [address] [place], [place] [address] [phone]

## 2022-10-18 NOTE — Telephone Encounter (Signed)
Contacted pt husband, informed him CT was stable, no new findings since prior image 2022. EEG was also normal. Muscle twitches have resolved, no reoccurrence since OV.  He verbally understood results and was appreciative for the call back.   Penumalli, Glenford Bayley, MD  P Gna-Pod 3 CT Results Stable imaging from 2022. No new findings. Continue current plan. -VRP

## 2022-10-26 ENCOUNTER — Ambulatory Visit: Payer: Medicare Other | Attending: Internal Medicine | Admitting: Internal Medicine

## 2022-10-26 ENCOUNTER — Encounter: Payer: Self-pay | Admitting: Internal Medicine

## 2022-10-26 VITALS — Ht 68.0 in | Wt 179.2 lb

## 2022-10-26 DIAGNOSIS — I951 Orthostatic hypotension: Secondary | ICD-10-CM | POA: Diagnosis not present

## 2022-10-26 DIAGNOSIS — I495 Sick sinus syndrome: Secondary | ICD-10-CM | POA: Diagnosis not present

## 2022-10-26 DIAGNOSIS — I4719 Other supraventricular tachycardia: Secondary | ICD-10-CM | POA: Diagnosis not present

## 2022-10-26 DIAGNOSIS — Z950 Presence of cardiac pacemaker: Secondary | ICD-10-CM | POA: Insufficient documentation

## 2022-10-26 NOTE — Patient Instructions (Signed)
Medication Instructions:  Your physician recommends that you continue on your current medications as directed. Please refer to the Current Medication list given to you today.  *If you need a refill on your cardiac medications before your next appointment, please call your pharmacy*   Lab Work: None ordered.  If you have labs (blood work) drawn today and your tests are completely normal, you will receive your results only by: MyChart Message (if you have MyChart) OR A paper copy in the mail If you have any lab test that is abnormal or we need to change your treatment, we will call you to review the results.   Testing/Procedures: None ordered.    Follow-Up: At [facility], you and your health needs are our priority.  As part of our continuing mission to provide you with exceptional heart care, we have created designated Provider Care Teams.  These Care Teams include your primary Cardiologist (physician) and Advanced Practice Providers (APPs -  Physician Assistants and Nurse Practitioners) who all work together to provide you with the care you need, when you need it.  We recommend signing up for the patient portal called "MyChart".  Sign up information is provided on this After Visit Summary.  MyChart is used to connect with patients for Virtual Visits (Telemedicine).  Patients are able to view lab/test results, encounter notes, upcoming appointments, etc.  Non-urgent messages can be sent to your provider as well.   To learn more about what you can do with MyChart, go to [email or web address].    Your next appointment:   3 months with Dr Graciela Husbands  Recommended salt supplementation.  The goal is about 3-4 grams of sodium, not sodium chloride, a day.  Salt supplements include oral ThermaTabs, buffered sodium preparation, SaltStick Vitassium,  Other options include NUUN.  This comes in pill form and is dissolved in liquids.  Other liquid preparations include liquid IV, Pedialyte  advance care, TRI-oral Also discussed the role of compression wear including thigh sleeves and abdominal binders.  Calf compression is not specifically recommended.Marland Kitchen

## 2022-10-26 NOTE — Progress Notes (Signed)
Patient Care Team: Laurann Montana, MD as PCP - General (Family Medicine) [place], Peter M, MD as PCP - Cardiology (Cardiology) Charlott Rakes, MD as Consulting Physician (Gastroenterology)   HPI  Rachel Clark is a 72 y.o. female former pt of [facility] with pacemaker  Medtr[employer] for sick sinus syndrome  History of coronary artery disease with prior bypass 2007, PCI 2007 teen, 2019 and 2022 most recently having been admitted for non-STEMI with successful PCI.  Postoperative care was complicated by GI bleed  Also and found to have orthostasis  sometimes falls and presyncope  No recent chest pain.  Some palpitations.  Chronic mild dyspnea on exertion.  No edema  DATE TEST EF   5/23 Echo  60-*65%          Records and Results Reviewed   Past Medical History:  Diagnosis Date   Arthritis    "fingers" (06/29/2016)   Chronic lower back pain    Colon cancer 12/04/2011   s/p Laparoscopic-assisted transverse colectomy on 12/19/2011 by Dr. Dwain Sarna.  pT3 N0 M0.    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   last seizure was in 1972 ;takes Tegretol (06/29/2016)    Headache    "weekly" (06/29/2016)   Hyperlipidemia    Hyponatremia    Hypothyroid    Iron deficiency anemia 11/17/2011   Memory change 11/13/2016   Monoclonal gammopathy of unknown significance    Orthostatic hypotension    Presence of permanent cardiac pacemaker    Syncope and collapse    pacemaker implanted    Past Surgical History:  Procedure Laterality Date   ANTERIOR CERVICAL DECOMP/DISCECTOMY FUSION  1980's   BACK SURGERY     CARDIAC CATHETERIZATION  05/20/2009   obstructive native vessel disease in LAD, RCA, and first diagonal, patent vein graft to distal RCA and LIMA to LAD,normal. ef 60%   CARDIAC CATHETERIZATION N/A  03/24/2015   Procedure: Left Heart Cath and Cors/Grafts Angiography;  Surgeon: Iran Ouch, MD;  Location: [facility] INVASIVE CV LAB;  Service: Cardiovascular;  Laterality: N/A;   CARDIAC CATHETERIZATION N/A 07/28/2015   Procedure: Left Heart Cath and Coronary Angiography;  Surgeon: Lennette Bihari, MD;  Location: [facility] INVASIVE CV LAB;  Service: Cardiovascular;  Laterality: N/A;   CARDIAC CATHETERIZATION N/A 11/09/2015   Procedure: Coronary Stent Intervention;  Surgeon: Tonny Bollman, MD;  Location: [facility] INVASIVE CV LAB;  Service: Cardiovascular;  Laterality: N/A;   CARDIAC CATHETERIZATION N/A 11/09/2015   Procedure: Left Heart Cath and Coronary Angiography;  Surgeon: Tonny Bollman, MD;  Location: [facility] INVASIVE CV LAB;  Service: Cardiovascular;  Laterality: N/A;   CARDIAC CATHETERIZATION N/A 06/29/2016   Procedure: Left Heart Cath and Coronary Angiography;  Surgeon: Yvonne Kendall, MD;  Location: [facility] INVASIVE CV LAB;  Service: Cardiovascular;  Laterality: N/A;   CARDIAC CATHETERIZATION N/A 06/29/2016   Procedure: Intravascular Pressure Wire/FFR Study;  Surgeon: Yvonne Kendall, MD;  Location: [facility] INVASIVE CV LAB;  Service: Cardiovascular;  Laterality: N/A;   CARDIAC CATHETERIZATION N/A 06/29/2016   Procedure: Coronary Balloon Angioplasty;  Surgeon: Yvonne Kendall, MD;  Location: [facility] INVASIVE CV LAB;  Service: Cardiovascular;  Laterality: N/A;   COLON RESECTION  12/19/2011   Procedure: COLON RESECTION LAPAROSCOPIC;  Surgeon: Emelia Loron, MD;  Location: [facility] OR;  Service: General;  Laterality: N/A;  laparoscopic hand assisted partial colon resection   COLON SURGERY     COLONOSCOPY     CORONARY ANGIOPLASTY WITH STENT PLACEMENT  ~ 2007   1 stent   CORONARY ARTERY BYPASS GRAFT  2007   "CABG X2"   DILATION AND CURETTAGE OF UTERUS  1973   EP IMPLANTABLE DEVICE N/A 03/25/2015   MDT Advisa DR pacemaker implanted by Dr Johney Frame for transient complete heart block and syncope   ESOPHAGOGASTRODUODENOSCOPY (EGD) WITH  PROPOFOL N/A 03/14/2021   Procedure: ESOPHAGOGASTRODUODENOSCOPY (EGD) WITH PROPOFOL;  Surgeon: Kerin Salen, MD;  Location: [facility] ENDOSCOPY;  Service: Gastroenterology;  Laterality: N/A;   ESOPHAGOGASTRODUODENOSCOPY (EGD) WITH PROPOFOL N/A 11/13/2021   Procedure: ESOPHAGOGASTRODUODENOSCOPY (EGD) WITH PROPOFOL;  Surgeon: Kathi Der, MD;  Location: [facility] ENDOSCOPY;  Service: Gastroenterology;  Laterality: N/A;   HEMOSTASIS CLIP PLACEMENT  03/14/2021   Procedure: HEMOSTASIS CLIP PLACEMENT;  Surgeon: Kerin Salen, MD;  Location: [facility] ENDOSCOPY;  Service: Gastroenterology;;   INSERT / REPLACE / REMOVE PACEMAKER     LEFT HEART CATH AND CORONARY ANGIOGRAPHY N/A 11/11/2021   Procedure: LEFT HEART CATH AND CORONARY ANGIOGRAPHY;  Surgeon: Marykay Lex, MD;  Location: [facility] INVASIVE CV LAB;  Service: Cardiovascular;  Laterality: N/A;   LEFT HEART CATH AND CORS/GRAFTS ANGIOGRAPHY N/A 02/21/2018   Procedure: LEFT HEART CATH AND CORS/GRAFTS ANGIOGRAPHY;  Surgeon: Runell Gess, MD;  Location: [facility] INVASIVE CV LAB;  Service: Cardiovascular;  Laterality: N/A;   LEFT HEART CATH AND CORS/GRAFTS ANGIOGRAPHY N/A 03/08/2021   Procedure: LEFT HEART CATH AND CORS/GRAFTS ANGIOGRAPHY;  Surgeon: [place], Peter M, MD;  Location: [facility] INVASIVE CV LAB;  Service: Cardiovascular;  Laterality: N/A;   PORT-A-CATH REMOVAL N/A 06/12/2013   Procedure: REMOVAL PORT-A-CATH;  Surgeon: Emelia Loron, MD;  Location:  SURGERY CENTER;  Service: General;  Laterality: N/A;   PORTACATH PLACEMENT  06/04/2012   Procedure: INSERTION PORT-A-CATH;  Surgeon: Emelia Loron, MD;  Location: [facility] OR;  Service: General;  Laterality: N/A;  Insertion of port-a-cath    POSTERIOR LAMINECTOMY / DECOMPRESSION CERVICAL SPINE  1990s   SCLEROTHERAPY  03/14/2021   Procedure: SCLEROTHERAPY;  Surgeon: Kerin Salen, MD;  Location: [facility] ENDOSCOPY;  Service: Gastroenterology;;   VAGINAL HYSTERECTOMY      Current Meds  Medication Sig   acetaminophen (TYLENOL)  500 MG tablet Take 500-1,000 mg by mouth daily as needed for mild pain or headache.   aspirin 81 MG chewable tablet Chew 1 tablet (81 mg total) by mouth daily.   Azelastine HCl 0.15 % SOLN Place 1 spray into both nostrils daily as needed (seasonal allergies).   Bempedoic Acid-Ezetimibe (NEXLIZET) 180-10 MG TABS Take 180 mg by mouth daily.   calcium carbonate (TUMS - DOSED IN MG ELEMENTAL CALCIUM) 500 MG chewable tablet Chew 500 mg by mouth daily.   carbamazepine (TEGRETOL XR) 200 MG 12 hr tablet Take 3 tablets (600 mg total) by mouth 2 (two) times daily.   Cholecalciferol (VITAMIN D) 50 MCG (2000 UT) CAPS Take 4,000 Units by mouth daily.   Cholecalciferol 50 MCG (2000 UT) TABS 2 tablets Orally Once a day for 30 day(s)   diphenhydrAMINE (BENADRYL) 25 MG tablet Take 25 mg by mouth daily.   diphenhydramine-acetaminophen (TYLENOL PM) 25-500 MG TABS tablet Take 1 tablet by mouth at bedtime.   DULoxetine (CYMBALTA) 60 MG capsule Take 60 mg by mouth 2 (two) times daily.   ethosuximide (ZARONTIN) 250 MG capsule TAKE 2 CAPSULES IN THE MORNING,  TAKE 2 CAPSULES AT NIGHT   Evolocumab with Infusor (REPATHA PUSHTRONEX SYSTEM) 420 MG/3.5ML SOCT Inject 420 mg into the skin every 30 (thirty) days.   fluticasone (FLONASE) 50 MCG/ACT nasal spray Place 1 spray into both nostrils daily as needed for allergies.   isosorbide mononitrate (IMDUR) 30 MG 24 hr tablet TAKE HALF TABLET (  TOTAL) BY MOUTH EVERY DAY   levothyroxine (SYNTHROID, LEVOTHROID) 50 MCG tablet Take 50 mcg by mouth daily before breakfast.   memantine (NAMENDA) 10 MG tablet Take 1 tablet (10 mg total) by mouth 2 (two) times daily.   nitroGLYCERIN (NITROSTAT) 0.4 MG SL tablet Place 1 tablet (0.4 mg total) under the tongue every 5 (five) minutes as needed. For chest pain.   pantoprazole (PROTONIX) 40 MG tablet TAKE ONE TABLET BY MOUTH TWICE DAILY FOR 30 DAYS THEN TAKE ONE TABLET BY MOUTH DAILY   prasugrel (EFFIENT) 10 MG TABS tablet Take 1 tablet (10 mg  total) by mouth daily.    Allergies  Allergen Reactions   Sulfamethoxazole Anaphylaxis    Don't recall   Aspirin Other (See Comments)    GI upset- can tolerate 81 mg ASA, just not full doses   Crestor [Rosuvastatin]     myalgias   Erythromycin Nausea Only    Gi upset   Lisinopril Cough   Niacin And Related Other (See Comments)    Don't recall   Penicillin G Benzathine     Other reaction(s): Unknown   Statins Other (See Comments)    Muscle pain   Sulfa Drugs Cross Reactors Swelling    Don't recall   Tetracyclines & Related Nausea Only   Xeloda [Capecitabine] Diarrhea   Penicillins Nausea Only and Rash        Plavix [Clopidogrel Bisulfate] Rash      Review of Systems negative except from HPI and PMH  Physical Exam Ht  (1.727 m)   Wt 179 lb 3.2 oz (81.3 kg)   SpO2 97%   BMI 27.25 kg/m  Well developed and well nourished in no acute distress HENT normal E scleral and icterus clear Neck Supple JVP flat; carotids brisk and full Clear to auscultation Regular rate and rhythm, no murmurs gallops or rub Soft with active bowel sounds No clubbing cyanosis  Edema Alert and oriented, grossly normal motor and sensory function Skin Warm and Dry  ECG sinus @ 88 19/09/34  Estimated Creatinine Clearance: 70.1 mL/min (by C-G formula based on SCr of 0.78 mg/dL).   Assessment and  Plan  Sick sinus syndrome   Pacemaker Medtronic   Orthostatic hypotension  Histograms suggest PVCs    Device function is normal.  Very infrequent pacing heart rate excursion suggests frequent burden of PVCs    We discussed the physiology of orthostatic intolerance including gravitational fluid shifts and the impact of hypertensive vascular disease on orthostasis and treatment options.  We discussed pharmacological options.  We discussed nonpharmacological options including raising the HOB, isometric contraction upon standing, abdominal binders  thigh sleeves. We emphasized the  importance of recognizing the prodrome and sitting prior to falling, safety in the shower and in the bathroom and the avoidance of dehydration   We will begin with a nonpharmacological approach with salt and water, compression and raising HOB .  History of PVCs.  Not apparently symptomatic.  Ejection fraction is normal.  Will follow-up      Current medicines are reviewed at length with the patient today .  The patient does not  have concerns  regarding medicines.

## 2022-10-31 ENCOUNTER — Encounter (HOSPITAL_COMMUNITY): Payer: Medicare Other

## 2022-11-01 DIAGNOSIS — F0390 Unspecified dementia without behavioral disturbance: Secondary | ICD-10-CM | POA: Diagnosis not present

## 2022-11-02 ENCOUNTER — Telehealth: Payer: Self-pay

## 2022-11-02 NOTE — Telephone Encounter (Signed)
The patient monitor screen is black and will not come on. I ordered the patient a new monitor. She should receive it in 7-10 business days.

## 2022-11-06 ENCOUNTER — Encounter: Payer: Self-pay | Admitting: Diagnostic Neuroimaging

## 2022-11-06 ENCOUNTER — Ambulatory Visit (INDEPENDENT_AMBULATORY_CARE_PROVIDER_SITE_OTHER): Payer: Medicare Other | Admitting: Diagnostic Neuroimaging

## 2022-11-06 VITALS — BP 150/81 | HR 85 | Ht 68.0 in | Wt 180.2 lb

## 2022-11-06 DIAGNOSIS — R569 Unspecified convulsions: Secondary | ICD-10-CM | POA: Diagnosis not present

## 2022-11-06 DIAGNOSIS — R413 Other amnesia: Secondary | ICD-10-CM | POA: Diagnosis not present

## 2022-11-06 NOTE — Progress Notes (Signed)
[facility]  PATIENT: Rachel Clark DOB: 02-01-1951  REFERRING CLINICIAN: Laurann Montana, MD HISTORY FROM: patient REASON FOR VISIT: follow up   HISTORICAL  CHIEF COMPLAINT:  Chief Complaint  Patient presents with   Follow-up    Patient in room #6 with her husband. Patient states here for a f/u.    HISTORY OF PRESENT ILLNESS:   UPDATE (11/06/22, VRP): Since last visit, doing better!. No more myoclonus. Memory loss continues.   UPDATE (09/25/22, VRP): Since last visit, stable until yesterday. New onset of confusion, muscle jerks. She did not reduce her CBZ dosing as per Margie Ege recommendation on 09/19/22 phone call. 09/25/22. Some lightheadedness, dizziness.   UPDATE (09/13/21, VRP): Since last visit, doing well. Symptoms are stable. No more seizures. Memory loss stable. No major changes in ADLs. No more syncope attacks.   PRIOR HPI (12/21/20, Dr. Anne Hahn): [name] is a 72 year old right-handed white female with a history of seizures since she was a child, she has not had a seizure in over 50 years, she is on carbamazepine and Zarontin, she has never wished to try to get off of her medication.  Her son also has a history of seizures.  Since August 2019 she has had low sodium levels.  She has been on a combination of carbamazepine and Cymbalta, but she has been on this for quite a number of years without any sodium level problems.  The patient has more recently had significant issues with orthostatic hypotension.  She does have a monoclonal antibody and is followed through hematology.  She has not had a demonstrated peripheral neuropathy on nerve conduction studies in the past.  The patient however has had significant issues with being dizzy with standing, she has had multiple syncopal episodes, the most recent was 2 days ago.  The patient has been to the emergency room on 04 September 2020 after she hit her head with a blackout.  She went to the emergency room on 10 Nov 2020 with another significant blackout.  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  The patient is followed by Dr. Peter [place]il[place].  Blood pressure medications have been reduced in dosing.  The patient has gone from 10 mg a day to 2.5 mg and Norvasc, and the Coreg dose was cut in half.  The patient has been told to increase fluids and salt.  She does have compression stockings.  The patient has had some decline in memory over time.  She is no longer driving mainly due to the dizziness issue.  The patient has had increasing problems with remembering recipes with cooking, she is still managing her medications.  Her husband helps her with the appointments.  The patient is repeating herself frequently throughout the day.  The patient claims that she feels bored, isolated.  She sleeps quite a bit throughout the day.  She comes to this office for further evaluation.  The patient does have a prior history of depression which is still an issue for her.  REVIEW OF SYSTEMS: Full 14 system review of systems performed and negative with exception of: as per HPI.  ALLERGIES: Allergies  Allergen Reactions   Sulfamethoxazole Anaphylaxis    Don't recall   Aspirin Other (See Comments)    GI upset- can tolerate 81 mg ASA, just not full doses   Crestor [Rosuvastatin]     myalgias   Erythromycin Nausea Only    Gi upset  Lisinopril Cough   Niacin And Related Other (See Comments)    Don't recall   Penicillin G Benzathine     Other reaction(s): Unknown   Statins Other (See Comments)    Muscle pain   Sulfa Drugs Cross Reactors Swelling    Don't recall   Tetracyclines & Related Nausea Only   Xeloda [Capecitabine] Diarrhea   Penicillins Nausea Only and Rash        Plavix [Clopidogrel Bisulfate] Rash    HOME MEDICATIONS: Outpatient Medications Prior to Visit  Medication Sig Dispense Refill   acetaminophen (TYLENOL) 500 MG tablet Take 500-1,000 mg  by mouth daily as needed for mild pain or headache.     aspirin 81 MG chewable tablet Chew 1 tablet (81 mg total) by mouth daily.     Azelastine HCl 0.15 % SOLN Place 1 spray into both nostrils daily as needed (seasonal allergies).  12   Bempedoic Acid-Ezetimibe (NEXLIZET) 180-10 MG TABS Take 180 mg by mouth daily. 90 tablet 3   calcium carbonate (TUMS - DOSED IN MG ELEMENTAL CALCIUM) 500 MG chewable tablet Chew 500 mg by mouth daily.     carbamazepine (TEGRETOL XR) 200 MG 12 hr tablet Take 3 tablets (600 mg total) by mouth 2 (two) times daily. 540 tablet 4   Cholecalciferol (VITAMIN D) 50 MCG (2000 UT) CAPS Take 4,000 Units by mouth daily.     Cholecalciferol 50 MCG (2000 UT) TABS 2 tablets Orally Once a day for 30 day(s)     diphenhydrAMINE (BENADRYL) 25 MG tablet Take 25 mg by mouth daily.     diphenhydramine-acetaminophen (TYLENOL PM) 25-500 MG TABS tablet Take 1 tablet by mouth at bedtime.     DULoxetine (CYMBALTA) 60 MG capsule Take 60 mg by mouth 2 (two) times daily.     ethosuximide (ZARONTIN) 250 MG capsule TAKE 2 CAPSULES IN THE MORNING, TAKE 2 CAPSULES AT NIGHT 360 capsule 3   Evolocumab with Infusor (REPATHA PUSHTRONEX SYSTEM) 420 MG/3.5ML SOCT Inject 420 mg into the skin every 30 (thirty) days. 3.6 mL 11   fluticasone (FLONASE) 50 MCG/ACT nasal spray Place 1 spray into both nostrils daily as needed for allergies.     isosorbide mononitrate (IMDUR) 30 MG 24 hr tablet TAKE HALF TABLET (15MG  TOTAL) BY MOUTH EVERY DAY 30 tablet 3   levothyroxine (SYNTHROID, LEVOTHROID) 50 MCG tablet Take 50 mcg by mouth daily before breakfast.     memantine (NAMENDA) 10 MG tablet Take 1 tablet (10 mg total) by mouth 2 (two) times daily. 180 tablet 4   nitroGLYCERIN (NITROSTAT) 0.4 MG SL tablet Place 1 tablet (0.4 mg total) under the tongue every 5 (five) minutes as needed. For chest pain. 25 tablet 11   pantoprazole (PROTONIX) 40 MG tablet TAKE ONE TABLET BY MOUTH TWICE DAILY FOR 30 DAYS THEN TAKE ONE  TABLET BY MOUTH DAILY 120 tablet 2   prasugrel (EFFIENT) 10 MG TABS tablet Take 1 tablet (10 mg total) by mouth daily. 90 tablet 3   No facility-administered medications prior to visit.     PHYSICAL EXAM  GENERAL EXAM/CONSTITUTIONAL: Vitals:  Vitals:   11/06/22 1414  BP: (!) 150/81  Pulse: 85  Weight: 180 lb 3.2 oz (81.7 kg)  Height: 5\' 8"  (1.727 m)   Body mass index is 27.4 kg/m. Wt Readings from Last 3 Encounters:  11/06/22 180 lb 3.2 oz (81.7 kg)  10/26/22 179 lb 3.2 oz (81.3 kg)  10/11/22 181 lb (82.1 kg)   Patient is in  no distress; well developed, nourished and groomed; neck is supple  CARDIOVASCULAR: Examination of carotid arteries is normal; no carotid bruits Regular rate and rhythm, no murmurs Examination of peripheral vascular system by observation and palpation is normal  EYES: Ophthalmoscopic exam of optic discs and posterior segments is normal; no papilledema or hemorrhages No results found.  MUSCULOSKELETAL: Gait, strength, tone, movements noted in Neurologic exam below  NEUROLOGIC: MENTAL STATUS:     12/21/2020    7:21 AM 08/17/2020   10:04 AM 08/18/2019    1:55 PM  MMSE - Mini Mental State Exam  Orientation to time 2 3 2   Orientation to Place 4 4 3   Registration 3 3 3   Attention/ Calculation 1 5 5   Recall 0 3 1  Language- name 2 objects 2 2 2   Language- repeat 1 1 1   Language- follow 3 step command 3 3 3   Language- read & follow direction 1 1 1   Write a sentence 1 1 1   Copy design 1 1 1   Total score 19 27 23    awake, alert, oriented to person, place and time recent and remote memory intact normal attention and concentration language fluent, comprehension intact, naming intact fund of knowledge appropriate  CRANIAL NERVE:  2nd - no papilledema on fundoscopic exam 2nd, 3rd, 4th, 6th - pupils equal and reactive to light, visual fields full to confrontation, extraocular muscles intact, no nystagmus 5th - facial sensation symmetric 7th -  facial strength symmetric 8th - hearing intact 9th - palate elevates symmetrically, uvula midline 11th - shoulder shrug symmetric 12th - tongue protrusion midline  MOTOR:  normal bulk and tone, full strength in the BUE, BLE  SENSORY:  normal and symmetric to light touch, temperature, vibration  COORDINATION:  finger-nose-finger, fine finger movements normal  REFLEXES:  deep tendon reflexes TRACE and symmetric  GAIT/STATION:  narrow based gait    DIAGNOSTIC DATA (LABS, IMAGING, TESTING) - I reviewed patient records, labs, notes, testing and imaging myself where available.  Lab Results  Component Value Date   WBC 8.0 09/25/2022   HGB 10.5 (L) 09/25/2022   HCT 32.3 (L) 09/25/2022   MCV 76 (L) 09/25/2022   PLT 457 (H) 09/25/2022      Component Value Date/Time   NA 136 10/11/2022 1005   NA 137 07/11/2016 0820   K 4.8 10/11/2022 1005   K 4.4 07/11/2016 0820   CL 98 10/11/2022 1005   CL 106 12/25/2012 0924   CO2 23 10/11/2022 1005   CO2 27 07/11/2016 0820   GLUCOSE 107 (H) 10/11/2022 1005   GLUCOSE 112 (H) 01/06/2022 0900   GLUCOSE 115 07/11/2016 0820   GLUCOSE 105 (H) 12/25/2012 0924   BUN 13 10/11/2022 1005   BUN 15.6 07/11/2016 0820   CREATININE 0.78 10/11/2022 1005   CREATININE 0.8 07/11/2016 0820   CALCIUM 9.1 10/11/2022 1005   CALCIUM 8.9 07/11/2016 0820   PROT 6.8 10/11/2022 1005   PROT 7.1 07/11/2016 0820   ALBUMIN 4.0 10/11/2022 1005   ALBUMIN 3.6 07/11/2016 0820   AST 9 10/11/2022 1005   AST 13 07/11/2016 0820   ALT 9 10/11/2022 1005   ALT 14 07/11/2016 0820   ALKPHOS 77 10/11/2022 1005   ALKPHOS 89 07/11/2016 0820   BILITOT 0.2 10/11/2022 1005   BILITOT 0.29 07/11/2016 0820   GFRNONAA >60 01/06/2022 0900   GFRAA 97 08/17/2020 1111   Lab Results  Component Value Date   CHOL 165 10/11/2022   HDL 87 10/11/2022  LDLCALC 60 10/11/2022   TRIG 99 10/11/2022   CHOLHDL 1.9 10/11/2022   Lab Results  Component Value Date   HGBA1C 5.5 02/20/2018    Lab Results  Component Value Date   VITAMINB12 625 09/25/2022   Lab Results  Component Value Date   TSH 1.782 11/10/2021    09/04/20 CT head  1. No acute intracranial abnormality. 2. LEFT forehead subcutaneous hematoma.  10/09/22 CT head  1. No recent insult or specific cause for symptoms. Stable compared to 2022. 2. Left temporal lobe encephalomalacia.    ASSESSMENT AND PLAN  72 y.o. year old female here with:   Dx:  1. Seizures (HCC)     PLAN:  SEIZURE DISORDER --> NEW ONSET MYOCLONUS, MEMORY LOSS, CONFUSION,  FATIGUE (since 09/24/22; now resolved) - continue carbamazepine 600mg  twice a day + ethosuximide 500mg  twice a day  - seizure precautions reviewed  MEMORY LOSS (MCI diagnosed in 2019; now progressed; some changes in ADLs; concern for mild dementia) - follow up neuropsych testing (Dr. Orie Fisherman) - check ATN panel - continue memantine 10mg  twice a day  - safety / supervision issues reviewed - daily physical activity / exercise (at least 15-30 minutes) - eat more plants / vegetables - increase social activities, brain stimulation, games, puzzles, hobbies, crafts, arts, music - aim for at least 7-8 hours sleep per night (or more) - avoid smoking and alcohol - caution with medications, finances; no driving  Orders Placed This Encounter  Procedures   Carbamazepine level, total   ATN PROFILE   Return in about 9 months (around 08/09/2023).    Suanne Marker, MD 11/06/2022, 2:43 PM Certified in Neurology, Neurophysiology and Neuroimaging  [facility] [address] [place], [place] [address] [phone]

## 2022-11-09 ENCOUNTER — Telehealth: Payer: Self-pay | Admitting: Internal Medicine

## 2022-11-09 ENCOUNTER — Encounter (HOSPITAL_COMMUNITY): Payer: Medicare Other

## 2022-11-09 DIAGNOSIS — I495 Sick sinus syndrome: Secondary | ICD-10-CM

## 2022-11-09 LAB — CUP PACEART REMOTE DEVICE CHECK
Battery Remaining Longevity: 50 mo
Battery Voltage: 2.98 V
Brady Statistic AP VP Percent: 0.02 %
Brady Statistic AP VS Percent: 1.87 %
Brady Statistic AS VP Percent: 0.11 %
Brady Statistic AS VS Percent: 98 %
Brady Statistic RA Percent Paced: 1.87 %
Brady Statistic RV Percent Paced: 0.12 %
Date Time Interrogation Session: 20240509114715
Implantable Lead Connection Status: 753985
Implantable Lead Connection Status: 753985
Implantable Lead Implant Date: 20160922
Implantable Lead Implant Date: 20160922
Implantable Lead Location: 753859
Implantable Lead Location: 753860
Implantable Lead Model: 5076
Implantable Lead Model: 5076
Implantable Pulse Generator Implant Date: 20160922
Lead Channel Impedance Value: 1368 Ohm
Lead Channel Impedance Value: 1368 Ohm
Lead Channel Impedance Value: 418 Ohm
Lead Channel Impedance Value: 456 Ohm
Lead Channel Pacing Threshold Amplitude: 0.75 V
Lead Channel Pacing Threshold Amplitude: 1.125 V
Lead Channel Pacing Threshold Pulse Width: 0.4 ms
Lead Channel Pacing Threshold Pulse Width: 0.4 ms
Lead Channel Sensing Intrinsic Amplitude: 3.125 mV
Lead Channel Sensing Intrinsic Amplitude: 3.125 mV
Lead Channel Sensing Intrinsic Amplitude: 8.5 mV
Lead Channel Sensing Intrinsic Amplitude: 8.5 mV
Lead Channel Setting Pacing Amplitude: 2.5 V
Lead Channel Setting Pacing Amplitude: 2.5 V
Lead Channel Setting Pacing Pulse Width: 0.4 ms
Lead Channel Setting Sensing Sensitivity: 2.8 mV
Zone Setting Status: 755011
Zone Setting Status: 755011

## 2022-11-09 LAB — ATN PROFILE
A -- Beta-amyloid 42/40 Ratio: 0.093 — ABNORMAL LOW (ref >0.102)
Beta-amyloid 40: 248.37 pg/mL
Beta-amyloid 42: 23.09 pg/mL
N -- NfL, Plasma: 5.07 pg/mL (ref 0.00–7.64)
T -- p-tau181: 1.71 pg/mL — ABNORMAL HIGH (ref 0.00–0.97)

## 2022-11-09 LAB — CARBAMAZEPINE LEVEL, TOTAL: Carbamazepine (Tegretol), S: 9.2 ug/mL (ref 4.0–12.0)

## 2022-11-09 NOTE — Telephone Encounter (Signed)
  Pt's husband calling, he said, they received the new monitor for the pt and they set it up and working. They would like for Dr. Graciela Husbands and device clinic know.

## 2022-11-10 NOTE — Telephone Encounter (Signed)
Noted  

## 2022-11-14 ENCOUNTER — Encounter: Payer: Self-pay | Admitting: Neurology

## 2022-11-16 NOTE — Telephone Encounter (Signed)
I called patient's husband.  Patient not a candidate for lecanemab due to moderate stage of dementia and MMSE 18/30.  Also reviewed benefits versus risks, every 2-week infusion schedule, MRI monitoring requirements.  Will continue to monitor and encourage supportive care at this time.  Suanne Marker, MD 11/16/2022, 5:49 PM Certified in Neurology, Neurophysiology and Neuroimaging  [facility] [address] [place], [place] [address] [phone]

## 2022-11-16 NOTE — Telephone Encounter (Signed)
Called the patient and got the husband. He is listed on DPR. I reviewed the lab results. Pt's husband verbalized understanding. He is asking is she would be considered for the new iv medication that is out. Pt has only tried memantine which she currently is on.  She has no contraindication for MRI and doesn't have a MRI up to date. I advised I would let Dr Marjory Lies know they are asking and see about getting her set up with the next steps to move forward if Dr Marjory Lies agrees.

## 2022-11-20 ENCOUNTER — Other Ambulatory Visit (HOSPITAL_COMMUNITY): Payer: Self-pay

## 2022-11-29 NOTE — Progress Notes (Signed)
Remote pacemaker transmission.   

## 2022-12-19 ENCOUNTER — Other Ambulatory Visit: Payer: Self-pay | Admitting: Cardiology

## 2022-12-25 ENCOUNTER — Telehealth: Payer: Self-pay | Admitting: Pharmacist

## 2022-12-25 MED ORDER — REPATHA SURECLICK 140 MG/ML ~~LOC~~ SOAJ: 140.0000 mg | SUBCUTANEOUS | 11 refills | Status: DC

## 2022-12-25 NOTE — Telephone Encounter (Addendum)
Received fax from [facility] pharmacy that Repatha pushtronix is being taken off the market. No latex allergy noted. New rx sent in for Repatha sureclick pen. Spoke with pt's husband, reviewed difference in injection technique/schedule. He was appreciative for the call.

## 2022-12-26 DIAGNOSIS — Z8719 Personal history of other diseases of the digestive system: Secondary | ICD-10-CM | POA: Diagnosis not present

## 2022-12-26 DIAGNOSIS — I10 Essential (primary) hypertension: Secondary | ICD-10-CM | POA: Diagnosis not present

## 2022-12-26 DIAGNOSIS — F324 Major depressive disorder, single episode, in partial remission: Secondary | ICD-10-CM | POA: Diagnosis not present

## 2022-12-26 DIAGNOSIS — I951 Orthostatic hypotension: Secondary | ICD-10-CM | POA: Diagnosis not present

## 2022-12-26 DIAGNOSIS — E039 Hypothyroidism, unspecified: Secondary | ICD-10-CM | POA: Diagnosis not present

## 2022-12-26 DIAGNOSIS — J309 Allergic rhinitis, unspecified: Secondary | ICD-10-CM | POA: Diagnosis not present

## 2022-12-26 DIAGNOSIS — G4719 Other hypersomnia: Secondary | ICD-10-CM | POA: Diagnosis not present

## 2022-12-26 DIAGNOSIS — G40909 Epilepsy, unspecified, not intractable, without status epilepticus: Secondary | ICD-10-CM | POA: Diagnosis not present

## 2022-12-26 DIAGNOSIS — E871 Hypo-osmolality and hyponatremia: Secondary | ICD-10-CM | POA: Diagnosis not present

## 2023-01-05 ENCOUNTER — Inpatient Hospital Stay: Payer: Medicare Other | Attending: Hematology and Oncology

## 2023-01-05 ENCOUNTER — Other Ambulatory Visit: Payer: Self-pay

## 2023-01-05 DIAGNOSIS — Z79899 Other long term (current) drug therapy: Secondary | ICD-10-CM | POA: Diagnosis not present

## 2023-01-05 DIAGNOSIS — Z888 Allergy status to other drugs, medicaments and biological substances status: Secondary | ICD-10-CM | POA: Diagnosis not present

## 2023-01-05 DIAGNOSIS — Z886 Allergy status to analgesic agent status: Secondary | ICD-10-CM | POA: Diagnosis not present

## 2023-01-05 DIAGNOSIS — D472 Monoclonal gammopathy: Secondary | ICD-10-CM | POA: Diagnosis not present

## 2023-01-05 DIAGNOSIS — Z880 Allergy status to penicillin: Secondary | ICD-10-CM | POA: Insufficient documentation

## 2023-01-05 DIAGNOSIS — D500 Iron deficiency anemia secondary to blood loss (chronic): Secondary | ICD-10-CM | POA: Diagnosis not present

## 2023-01-05 DIAGNOSIS — Z882 Allergy status to sulfonamides status: Secondary | ICD-10-CM | POA: Insufficient documentation

## 2023-01-05 DIAGNOSIS — I252 Old myocardial infarction: Secondary | ICD-10-CM | POA: Insufficient documentation

## 2023-01-05 DIAGNOSIS — Z7902 Long term (current) use of antithrombotics/antiplatelets: Secondary | ICD-10-CM | POA: Diagnosis not present

## 2023-01-05 DIAGNOSIS — K922 Gastrointestinal hemorrhage, unspecified: Secondary | ICD-10-CM | POA: Diagnosis not present

## 2023-01-05 DIAGNOSIS — Z881 Allergy status to other antibiotic agents status: Secondary | ICD-10-CM | POA: Diagnosis not present

## 2023-01-05 LAB — CBC WITH DIFFERENTIAL (CANCER CENTER ONLY)
Abs Immature Granulocytes: 0.01 10*3/uL (ref 0.00–0.07)
Basophils Absolute: 0 10*3/uL (ref 0.0–0.1)
Basophils Relative: 1 %
Eosinophils Absolute: 0.1 10*3/uL (ref 0.0–0.5)
Eosinophils Relative: 2 %
HCT: 32.7 % — ABNORMAL LOW (ref 36.0–46.0)
Hemoglobin: 10.3 g/dL — ABNORMAL LOW (ref 12.0–15.0)
Immature Granulocytes: 0 %
Lymphocytes Relative: 21 %
Lymphs Abs: 1.1 10*3/uL (ref 0.7–4.0)
MCH: 24.3 pg — ABNORMAL LOW (ref 26.0–34.0)
MCHC: 31.5 g/dL (ref 30.0–36.0)
MCV: 77.3 fL — ABNORMAL LOW (ref 80.0–100.0)
Monocytes Absolute: 0.4 10*3/uL (ref 0.1–1.0)
Monocytes Relative: 8 %
Neutro Abs: 3.5 10*3/uL (ref 1.7–7.7)
Neutrophils Relative %: 68 %
Platelet Count: 351 10*3/uL (ref 150–400)
RBC: 4.23 MIL/uL (ref 3.87–5.11)
RDW: 16.3 % — ABNORMAL HIGH (ref 11.5–15.5)
WBC Count: 5.2 10*3/uL (ref 4.0–10.5)
nRBC: 0 % (ref 0.0–0.2)

## 2023-01-05 LAB — CMP (CANCER CENTER ONLY)
ALT: 8 U/L (ref 0–44)
AST: 12 U/L — ABNORMAL LOW (ref 15–41)
Albumin: 3.8 g/dL (ref 3.5–5.0)
Alkaline Phosphatase: 57 U/L (ref 38–126)
Anion gap: 7 (ref 5–15)
BUN: 14 mg/dL (ref 8–23)
CO2: 27 mmol/L (ref 22–32)
Calcium: 9 mg/dL (ref 8.9–10.3)
Chloride: 105 mmol/L (ref 98–111)
Creatinine: 0.85 mg/dL (ref 0.44–1.00)
GFR, Estimated: >60 mL/min (ref >60)
Glucose, Bld: 108 mg/dL — ABNORMAL HIGH (ref 70–99)
Potassium: 4.3 mmol/L (ref 3.5–5.1)
Sodium: 139 mmol/L (ref 135–145)
Total Bilirubin: 0.4 mg/dL (ref 0.3–1.2)
Total Protein: 6.6 g/dL (ref 6.5–8.1)

## 2023-01-08 LAB — KAPPA/LAMBDA LIGHT CHAINS
Kappa free light chain: 39.1 mg/L — ABNORMAL HIGH (ref 3.3–19.4)
Kappa, lambda light chain ratio: 2.01 — ABNORMAL HIGH (ref 0.26–1.65)
Lambda free light chains: 19.5 mg/L (ref 5.7–26.3)

## 2023-01-10 LAB — MULTIPLE MYELOMA PANEL, SERUM
Albumin SerPl Elph-Mcnc: 3.6 g/dL (ref 2.9–4.4)
Albumin/Glob SerPl: 1.2 (ref 0.7–1.7)
Alpha 1: 0.3 g/dL (ref 0.0–0.4)
Alpha2 Glob SerPl Elph-Mcnc: 0.8 g/dL (ref 0.4–1.0)
B-Globulin SerPl Elph-Mcnc: 0.9 g/dL (ref 0.7–1.3)
Gamma Glob SerPl Elph-Mcnc: 1.2 g/dL (ref 0.4–1.8)
Globulin, Total: 3.2 g/dL (ref 2.2–3.9)
IgA: 160 mg/dL (ref 64–422)
IgG (Immunoglobin G), Serum: 1291 mg/dL (ref 586–1602)
IgM (Immunoglobulin M), Srm: 99 mg/dL (ref 26–217)
M Protein SerPl Elph-Mcnc: 0.6 g/dL — ABNORMAL HIGH
Total Protein ELP: 6.8 g/dL (ref 6.0–8.5)

## 2023-01-15 ENCOUNTER — Telehealth: Payer: Self-pay

## 2023-01-15 ENCOUNTER — Other Ambulatory Visit: Payer: Self-pay

## 2023-01-15 ENCOUNTER — Inpatient Hospital Stay: Payer: Medicare Other | Admitting: Hematology and Oncology

## 2023-01-15 ENCOUNTER — Encounter: Payer: Self-pay | Admitting: Hematology and Oncology

## 2023-01-15 VITALS — BP 159/69 | HR 87 | Temp 98.6°F | Wt 184.2 lb

## 2023-01-15 DIAGNOSIS — Z7902 Long term (current) use of antithrombotics/antiplatelets: Secondary | ICD-10-CM | POA: Diagnosis not present

## 2023-01-15 DIAGNOSIS — Z79899 Other long term (current) drug therapy: Secondary | ICD-10-CM | POA: Diagnosis not present

## 2023-01-15 DIAGNOSIS — K922 Gastrointestinal hemorrhage, unspecified: Secondary | ICD-10-CM | POA: Diagnosis not present

## 2023-01-15 DIAGNOSIS — D472 Monoclonal gammopathy: Secondary | ICD-10-CM | POA: Diagnosis not present

## 2023-01-15 DIAGNOSIS — I252 Old myocardial infarction: Secondary | ICD-10-CM | POA: Diagnosis not present

## 2023-01-15 DIAGNOSIS — D500 Iron deficiency anemia secondary to blood loss (chronic): Secondary | ICD-10-CM | POA: Diagnosis not present

## 2023-01-15 DIAGNOSIS — Z85038 Personal history of other malignant neoplasm of large intestine: Secondary | ICD-10-CM | POA: Diagnosis not present

## 2023-01-15 NOTE — Telephone Encounter (Signed)
Faxed today's office note and requested colonoscopy. Faxed to [phone], received fax confirmation.

## 2023-01-15 NOTE — Assessment & Plan Note (Signed)
She has remote history of colon cancer She is overdue for colonoscopy Due to signs of iron deficiency anemia, I recommend follow-up for repeat GI evaluation

## 2023-01-15 NOTE — Progress Notes (Signed)
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   All questions were answered. The patient knows to call the clinic with any problems, questions or concerns. The total time spent in the appointment was 30 minutes encounter with patients including review of chart and various tests results, discussions about plan of care and coordination of care plan   Artis Delay, MD 01/15/2023 10:10 AM  INTERVAL HISTORY: Please see below for problem oriented charting. she returns for surveillance follow-up for history of colon cancer and MGUS She denies signs or symptoms of anemia She had extensive evaluation last year for severe anemia in the context of upper GI bleed She have heart attack last year and was placed on antiplatelet agent causing severe anemia The patient denies any recent signs or symptoms of bleeding such as spontaneous epistaxis, hematuria or hematochezia. We spent a lot of time reviewing  test results today  REVIEW OF SYSTEMS:   Constitutional: Denies fevers, chills or abnormal weight loss Eyes: Denies blurriness of vision Ears, nose, mouth, throat, and face: Denies mucositis or sore throat Respiratory: Denies cough, dyspnea or wheezes Cardiovascular: Denies palpitation, chest discomfort or lower extremity swelling Gastrointestinal:  Denies nausea, heartburn or change in bowel habits Skin: Denies abnormal skin rashes Lymphatics: Denies new lymphadenopathy or easy bruising Neurological:Denies numbness, tingling or new weaknesses Behavioral/Psych: Mood is stable, no new changes  All other systems were reviewed with the patient and are negative.  I have reviewed the past medical history, past surgical history, social history and family history with the patient and they are unchanged from previous note.  ALLERGIES:  is allergic to sulfamethoxazole, aspirin, crestor [rosuvastatin], erythromycin, lisinopril, niacin and related, penicillin g benzathine, statins, sulfa drugs cross reactors, tetracyclines & related, xeloda [capecitabine], penicillins, and plavix [clopidogrel bisulfate].  MEDICATIONS:  Current Outpatient Medications  Medication Sig Dispense Refill   ferrous sulfate 325 (65 FE) MG EC tablet Take 325 mg by mouth daily with breakfast.     acetaminophen (TYLENOL) 500 MG tablet Take 500-1,000 mg by mouth daily as needed for mild pain or headache.     aspirin 81 MG chewable tablet Chew 1 tablet (81 mg total) by mouth daily.     Azelastine HCl 0.15 % SOLN Place 1 spray into both nostrils daily as needed (seasonal allergies).  12   Bempedoic Acid-Ezetimibe (NEXLIZET) 180-10 MG TABS Take 180 mg by mouth daily. 90 tablet 3   calcium carbonate (TUMS - DOSED IN MG ELEMENTAL CALCIUM) 500 MG chewable tablet Chew 500 mg by mouth  daily.     carbamazepine (TEGRETOL XR) 200 MG 12 hr tablet Take 3 tablets (600 mg total) by mouth 2 (two) times daily. 540 tablet 4   Cholecalciferol  (VITAMIN D) 50 MCG (2000 UT) CAPS Take 4,000 Units by mouth daily.     diphenhydrAMINE (BENADRYL) 25 MG tablet Take 25 mg by mouth daily.     diphenhydramine-acetaminophen (TYLENOL PM) 25-500 MG TABS tablet Take 1 tablet by mouth at bedtime.     DULoxetine (CYMBALTA) 60 MG capsule Take 60 mg by mouth 2 (two) times daily.     ethosuximide (ZARONTIN) 250 MG capsule TAKE 2 CAPSULES IN THE MORNING, TAKE 2 CAPSULES AT NIGHT 360 capsule 3   Evolocumab (REPATHA SURECLICK) 140 MG/ML SOAJ Inject 140 mg into the skin every 14 (fourteen) days. 2 mL 11   fluticasone (FLONASE) 50 MCG/ACT nasal spray Place 1 spray into both nostrils daily as needed for allergies.     isosorbide mononitrate (IMDUR) 30 MG 24 hr tablet TAKE HALF TABLET (15MG  TOTAL) BY MOUTH EVERY DAY 30 tablet 3   levothyroxine (SYNTHROID, LEVOTHROID) 50 MCG tablet Take 50 mcg by mouth daily before breakfast.     memantine (NAMENDA) 10 MG tablet Take 1 tablet (10 mg total) by mouth 2 (two) times daily. 180 tablet 4   nitroGLYCERIN (NITROSTAT) 0.4 MG SL tablet Place 1 tablet (0.4 mg total) under the tongue every 5 (five) minutes as needed. For chest pain. 25 tablet 11   pantoprazole (PROTONIX) 40 MG tablet TAKE ONE TABLET BY MOUTH TWICE DAILY FOR 30 DAYS THEN TAKE ONE TABLET BY MOUTH DAILY 120 tablet 2   prasugrel (EFFIENT) 10 MG TABS tablet Take 1 tablet (10 mg total) by mouth daily. 90 tablet 3   No current facility-administered medications for this visit.    SUMMARY OF ONCOLOGIC HISTORY: Oncology History  History of colon cancer  12/04/2011 Initial Diagnosis   Colon cancer   11/03/2014 Imaging   CT scan showed no evidence of cancer recurrence.   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 The patient have very minimal M spike and asymptomatic. She is observed #2 T3, N0, M0 colon cancer This was discovered when the patient presented with severe anemia. Colonoscopy revealed abnormal lesion and in 12/19/2011 Dr. Dwain Sarna perform  laparoscopic-assisted transverse colectomy. She received adjuvant Xeloda in August 2013 and switched to IV 5-FU in December 2013 due to poor tolerance to Xeloda. Her last chemotherapy is completed by March 2014. She has normal colonoscopy in July 2014. CT scan from 10/29/2013 show no evidence of recurrence.  PHYSICAL EXAMINATION: ECOG PERFORMANCE STATUS: 1 - Symptomatic but completely ambulatory  Vitals:   01/15/23 0916  BP: (!) 159/69  Pulse: 87  Temp: 98.6 F (37 C)  SpO2: 98%   Filed Weights   01/15/23 0916  Weight: 184 lb 3.2 oz (83.6 kg)    GENERAL:alert, no distress and comfortable NEURO: alert & oriented x 3 with fluent speech, no focal motor/sensory deficits  LABORATORY DATA:  I have reviewed the data as listed    Component Value Date/Time   NA 139 01/05/2023 0915   NA 136 10/11/2022 1005   NA 137 07/11/2016 0820   K 4.3 01/05/2023 0915   K 4.4 07/11/2016 0820   CL 105 01/05/2023 0915   CL 106 12/25/2012 0924   CO2 27 01/05/2023 0915   CO2 27 07/11/2016 0820   GLUCOSE 108 (H) 01/05/2023 0915   GLUCOSE 115 07/11/2016 0820   GLUCOSE 105 (  H) 12/25/2012 0924   BUN 14 01/05/2023 0915   BUN 13 10/11/2022 1005   BUN 15.6 07/11/2016 0820   CREATININE 0.85 01/05/2023 0915   CREATININE 0.8 07/11/2016 0820   CALCIUM 9.0 01/05/2023 0915   CALCIUM 8.9 07/11/2016 0820   PROT 6.6 01/05/2023 0915   PROT 6.8 10/11/2022 1005   PROT 7.1 07/11/2016 0820   ALBUMIN 3.8 01/05/2023 0915   ALBUMIN 4.0 10/11/2022 1005   ALBUMIN 3.6 07/11/2016 0820   AST 12 (L) 01/05/2023 0915   AST 13 07/11/2016 0820   ALT 8 01/05/2023 0915   ALT 14 07/11/2016 0820   ALKPHOS 57 01/05/2023 0915   ALKPHOS 89 07/11/2016 0820   BILITOT 0.4 01/05/2023 0915   BILITOT 0.29 07/11/2016 0820   GFRNONAA >60 01/05/2023 0915   GFRAA 97 08/17/2020 1111    No results found for: "SPEP", "UPEP"  Lab Results  Component Value Date   WBC 5.2 01/05/2023   NEUTROABS 3.5 01/05/2023   HGB 10.3 (L)  01/05/2023   HCT 32.7 (L) 01/05/2023   MCV 77.3 (L) 01/05/2023   PLT 351 01/05/2023      Chemistry      Component Value Date/Time   NA 139 01/05/2023 0915   NA 136 10/11/2022 1005   NA 137 07/11/2016 0820   K 4.3 01/05/2023 0915   K 4.4 07/11/2016 0820   CL 105 01/05/2023 0915   CL 106 12/25/2012 0924   CO2 27 01/05/2023 0915   CO2 27 07/11/2016 0820   BUN 14 01/05/2023 0915   BUN 13 10/11/2022 1005   BUN 15.6 07/11/2016 0820   CREATININE 0.85 01/05/2023 0915   CREATININE 0.8 07/11/2016 0820      Component Value Date/Time   CALCIUM 9.0 01/05/2023 0915   CALCIUM 8.9 07/11/2016 0820   ALKPHOS 57 01/05/2023 0915   ALKPHOS 89 07/11/2016 0820   AST 12 (L) 01/05/2023 0915   AST 13 07/11/2016 0820   ALT 8 01/05/2023 0915   ALT 14 07/11/2016 0820   BILITOT 0.4 01/05/2023 0915   BILITOT 0.29 07/11/2016 0820

## 2023-01-15 NOTE — Assessment & Plan Note (Signed)
Overall, there is no progression of MGUS Her light chains are stable She has no signs of anemia, hypercalcemia or renal failure We will continue to see her once a year

## 2023-01-15 NOTE — Assessment & Plan Note (Signed)
She had EGD last year which show abnormalities in the upper endoscopy causing upper GI bleed She is taking oral iron supplement but is noted to have progressive anemia with microcytosis Recommend repeat GI evaluation Will reach out to her gastroenterologist to set up an appointment

## 2023-01-23 ENCOUNTER — Telehealth: Payer: Self-pay | Admitting: Cardiology

## 2023-01-23 ENCOUNTER — Other Ambulatory Visit: Payer: Self-pay

## 2023-01-23 ENCOUNTER — Encounter (HOSPITAL_COMMUNITY): Payer: Self-pay | Admitting: Emergency Medicine

## 2023-01-23 ENCOUNTER — Emergency Department (HOSPITAL_COMMUNITY): Payer: Medicare Other

## 2023-01-23 ENCOUNTER — Emergency Department (HOSPITAL_COMMUNITY)
Admission: EM | Admit: 2023-01-23 | Discharge: 2023-01-23 | Disposition: A | Discharge status: Home / Self Care | Payer: Medicare Other | Attending: Emergency Medicine | Admitting: Emergency Medicine

## 2023-01-23 DIAGNOSIS — R008 Other abnormalities of heart beat: Secondary | ICD-10-CM | POA: Diagnosis not present

## 2023-01-23 DIAGNOSIS — Z7982 Long term (current) use of aspirin: Secondary | ICD-10-CM | POA: Insufficient documentation

## 2023-01-23 DIAGNOSIS — Z85038 Personal history of other malignant neoplasm of large intestine: Secondary | ICD-10-CM | POA: Diagnosis not present

## 2023-01-23 DIAGNOSIS — D649 Anemia, unspecified: Secondary | ICD-10-CM | POA: Diagnosis not present

## 2023-01-23 DIAGNOSIS — Z951 Presence of aortocoronary bypass graft: Secondary | ICD-10-CM | POA: Insufficient documentation

## 2023-01-23 DIAGNOSIS — I2510 Atherosclerotic heart disease of native coronary artery without angina pectoris: Secondary | ICD-10-CM | POA: Insufficient documentation

## 2023-01-23 DIAGNOSIS — Z955 Presence of coronary angioplasty implant and graft: Secondary | ICD-10-CM | POA: Insufficient documentation

## 2023-01-23 DIAGNOSIS — R079 Chest pain, unspecified: Secondary | ICD-10-CM | POA: Diagnosis not present

## 2023-01-23 DIAGNOSIS — Z950 Presence of cardiac pacemaker: Secondary | ICD-10-CM | POA: Diagnosis not present

## 2023-01-23 DIAGNOSIS — R0789 Other chest pain: Secondary | ICD-10-CM | POA: Diagnosis not present

## 2023-01-23 DIAGNOSIS — I491 Atrial premature depolarization: Secondary | ICD-10-CM | POA: Diagnosis not present

## 2023-01-23 LAB — COMPREHENSIVE METABOLIC PANEL
ALT: 12 U/L (ref 0–44)
AST: 22 U/L (ref 15–41)
Albumin: 3.3 g/dL — ABNORMAL LOW (ref 3.5–5.0)
Alkaline Phosphatase: 61 U/L (ref 38–126)
Anion gap: 9 (ref 5–15)
BUN: 13 mg/dL (ref 8–23)
CO2: 23 mmol/L (ref 22–32)
Calcium: 8.8 mg/dL — ABNORMAL LOW (ref 8.9–10.3)
Chloride: 105 mmol/L (ref 98–111)
Creatinine, Ser: 0.82 mg/dL (ref 0.44–1.00)
GFR, Estimated: >60 mL/min (ref >60)
Glucose, Bld: 137 mg/dL — ABNORMAL HIGH (ref 70–99)
Potassium: 3.9 mmol/L (ref 3.5–5.1)
Sodium: 137 mmol/L (ref 135–145)
Total Bilirubin: 0.2 mg/dL — ABNORMAL LOW (ref 0.3–1.2)
Total Protein: 6.7 g/dL (ref 6.5–8.1)

## 2023-01-23 LAB — CBC WITH DIFFERENTIAL/PLATELET
Abs Immature Granulocytes: 0.03 10*3/uL (ref 0.00–0.07)
Basophils Absolute: 0.1 10*3/uL (ref 0.0–0.1)
Basophils Relative: 1 %
Eosinophils Absolute: 0.2 10*3/uL (ref 0.0–0.5)
Eosinophils Relative: 3 %
HCT: 32.4 % — ABNORMAL LOW (ref 36.0–46.0)
Hemoglobin: 10 g/dL — ABNORMAL LOW (ref 12.0–15.0)
Immature Granulocytes: 1 %
Lymphocytes Relative: 18 %
Lymphs Abs: 1 10*3/uL (ref 0.7–4.0)
MCH: 24 pg — ABNORMAL LOW (ref 26.0–34.0)
MCHC: 30.9 g/dL (ref 30.0–36.0)
MCV: 77.9 fL — ABNORMAL LOW (ref 80.0–100.0)
Monocytes Absolute: 0.6 10*3/uL (ref 0.1–1.0)
Monocytes Relative: 10 %
Neutro Abs: 3.9 10*3/uL (ref 1.7–7.7)
Neutrophils Relative %: 67 %
Platelets: 357 10*3/uL (ref 150–400)
RBC: 4.16 MIL/uL (ref 3.87–5.11)
RDW: 16.4 % — ABNORMAL HIGH (ref 11.5–15.5)
WBC: 5.7 10*3/uL (ref 4.0–10.5)
nRBC: 0 % (ref 0.0–0.2)

## 2023-01-23 LAB — I-STAT CHEM 8, ED
BUN: 14 mg/dL (ref 8–23)
Calcium, Ion: 1.1 mmol/L — ABNORMAL LOW (ref 1.15–1.40)
Chloride: 105 mmol/L (ref 98–111)
Creatinine, Ser: 0.7 mg/dL (ref 0.44–1.00)
Glucose, Bld: 138 mg/dL — ABNORMAL HIGH (ref 70–99)
HCT: 31 % — ABNORMAL LOW (ref 36.0–46.0)
Hemoglobin: 10.5 g/dL — ABNORMAL LOW (ref 12.0–15.0)
Potassium: 3.9 mmol/L (ref 3.5–5.1)
Sodium: 138 mmol/L (ref 135–145)
TCO2: 21 mmol/L — ABNORMAL LOW (ref 22–32)

## 2023-01-23 LAB — CBG MONITORING, ED: Glucose-Capillary: 119 mg/dL — ABNORMAL HIGH (ref 70–99)

## 2023-01-23 LAB — MAGNESIUM: Magnesium: 1.9 mg/dL (ref 1.7–2.4)

## 2023-01-23 LAB — TROPONIN I (HIGH SENSITIVITY)
Troponin I (High Sensitivity): 7 ng/L (ref <18)
Troponin I (High Sensitivity): 7 ng/L (ref <18)

## 2023-01-23 LAB — LIPASE, BLOOD: Lipase: 34 U/L (ref 11–51)

## 2023-01-23 NOTE — Discharge Instructions (Signed)
Testing results today in the emergency department are reassuring.  Follow-up with cardiology office.  A referral has been sent.  If you do not hear from them in the next couple days, call the number below.  Return to the emergency department for any further symptoms of concern.

## 2023-01-23 NOTE — ED Provider Notes (Signed)
EMERGENCY DEPARTMENT AT [facility] Provider Note   CSN: [id number] Arrival date & time: 01/23/23  4098     History  Chief Complaint  Patient presents with   Chest Pain    Rachel Clark is a 72 y.o. female.  HPI Patient presents for chest pain.  Medical history includes CAD, HLD, GERD, seizures, sick sinus syndrome s/p pacemaker placement, anemia, remote colon cancer, GI bleed, MGUS.  She was last seen by oncology a week ago.  MGUS was deemed to be stable.  Plan is to follow-up in a year.  She last saw cardiology in April.  Last heart cath was 1 year ago.  At that time, she underwent balloon angioplasty with overlapping stent placement.  She has undergone CABG in the past.  She is on long-term DAPT.  Patient states that she woke up this morning in her normal state of health.  At approximately 8 AM, she was at rest when she experienced a wave of chest pain.  She denies any other associated symptoms at the time.  She took 1 NTG at home which did improve her symptoms.  She received 324 ASA prior to arrival.  She is currently pain-free.    Home Medications Prior to Admission medications   Medication Sig Start Date End Date Taking? Authorizing Provider  acetaminophen (TYLENOL) 500 MG tablet Take 500-1,000 mg by mouth daily as needed for mild pain or headache.    [provider]  aspirin 81 MG chewable tablet Chew 1 tablet (81 mg total) by mouth daily. 06/30/16   Little Ishikawa, NP  Azelastine HCl 0.15 % SOLN Place 1 spray into both nostrils daily as needed (seasonal allergies). 12/22/14   [provider]  Bempedoic Acid-Ezetimibe (NEXLIZET) 180-10 MG TABS Take 180 mg by mouth daily. 04/25/22   [place], Peter M, MD  calcium carbonate (TUMS - DOSED IN MG ELEMENTAL CALCIUM) 500 MG chewable tablet Chew 500 mg by mouth daily.    [provider]  carbamazepine (TEGRETOL XR) 200 MG 12 hr tablet Take 3 tablets (600 mg total) by mouth 2 (two) times  daily. 09/19/22 12/13/23  Glean Salvo, NP  Cholecalciferol (VITAMIN D) 50 MCG (2000 UT) CAPS Take 4,000 Units by mouth daily.    [provider]  diphenhydrAMINE (BENADRYL) 25 MG tablet Take 25 mg by mouth daily.    [provider]  diphenhydramine-acetaminophen (TYLENOL PM) 25-500 MG TABS tablet Take 1 tablet by mouth at bedtime.    [provider]  DULoxetine (CYMBALTA) 60 MG capsule Take 60 mg by mouth 2 (two) times daily.    [provider]  ethosuximide (ZARONTIN) 250 MG capsule TAKE 2 CAPSULES IN THE MORNING, TAKE 2 CAPSULES AT NIGHT 07/11/22   Glean Salvo, NP  Evolocumab (REPATHA SURECLICK) 140 MG/ML SOAJ Inject 140 mg into the skin every 14 (fourteen) days. 12/25/22   [place], Peter M, MD  ferrous sulfate 325 (65 FE) MG EC tablet Take 325 mg by mouth daily with breakfast.    [provider]  fluticasone (FLONASE) 50 MCG/ACT nasal spray Place 1 spray into both nostrils daily as needed for allergies.    [provider]  isosorbide mononitrate (IMDUR) 30 MG 24 hr tablet TAKE HALF TABLET (15MG  TOTAL) BY MOUTH EVERY DAY 04/20/22   [place], Peter M, MD  levothyroxine (SYNTHROID, LEVOTHROID) 50 MCG tablet Take 50 mcg by mouth daily before breakfast.    [provider]  memantine (NAMENDA) 10  MG tablet Take 1 tablet (10 mg total) by mouth 2 (two) times daily. 09/14/22   Glean Salvo, NP  nitroGLYCERIN (NITROSTAT) 0.4 MG SL tablet Place 1 tablet (0.4 mg total) under the tongue every 5 (five) minutes as needed. For chest pain. 11/23/21   [place], Peter M, MD  Oral Electrolytes (THERMOTABS PO) Take 452 mg by mouth daily. Thermotabs 452 mg sodium chloride    [provider]  pantoprazole (PROTONIX) 40 MG tablet TAKE ONE TABLET BY MOUTH TWICE DAILY FOR 30 DAYS THEN TAKE ONE TABLET BY MOUTH DAILY 02/08/22   [place], Peter M, MD  prasugrel (EFFIENT) 10 MG TABS tablet Take 1 tablet (10 mg total) by mouth daily. 04/25/22   [place], Peter M, MD       Allergies    Sulfamethoxazole, Aspirin, Crestor [rosuvastatin], Erythromycin, Lisinopril, Niacin and related, Penicillin g benzathine, Statins, Sulfa drugs cross reactors, Tetracyclines & related, Xeloda [capecitabine], Penicillins, and Plavix [clopidogrel bisulfate]    Review of Systems   Review of Systems  Cardiovascular:  Positive for chest pain.  All other systems reviewed and are negative.   Physical Exam Updated Vital Signs BP (!) 115/95   Pulse (!) 38   Temp (!) 97.4 F (36.3 C) (Oral)   Resp 19   Ht 5\' 8"  (1.727 m)   Wt 83.5 kg   SpO2 98%   BMI 27.98 kg/m  Physical Exam Vitals and nursing note reviewed.  Constitutional:      General: She is not in acute distress.    Appearance: Normal appearance. She is well-developed. She is not ill-appearing, toxic-appearing or diaphoretic.  HENT:     Head: Normocephalic and atraumatic.     Right Ear: External ear normal.     Left Ear: External ear normal.     Nose: Nose normal.     Mouth/Throat:     Mouth: Mucous membranes are moist.  Eyes:     Extraocular Movements: Extraocular movements intact.     Conjunctiva/sclera: Conjunctivae normal.  Cardiovascular:     Rate and Rhythm: Normal rate. Rhythm irregular.     Heart sounds: No murmur heard. Pulmonary:     Effort: Pulmonary effort is normal. No respiratory distress.     Breath sounds: Normal breath sounds. No wheezing or rales.  Chest:     Chest wall: No tenderness.  Abdominal:     General: There is no distension.     Palpations: Abdomen is soft.     Tenderness: There is no abdominal tenderness.  Musculoskeletal:        General: No swelling. Normal range of motion.     Cervical back: Normal range of motion and neck supple.     Right lower leg: No edema.     Left lower leg: No edema.  Skin:    General: Skin is warm and dry.     Coloration: Skin is not jaundiced or pale.  Neurological:     General: No focal deficit present.     Mental Status: She is alert  and oriented to person, place, and time.  Psychiatric:        Mood and Affect: Mood normal.        Behavior: Behavior normal.        Thought Content: Thought content normal.        Judgment: Judgment normal.     ED Results / Procedures / Treatments   Labs (all labs ordered are listed, but only abnormal results are displayed) Labs  Reviewed  COMPREHENSIVE METABOLIC PANEL - Abnormal; Notable for the following components:      Result Value   Glucose, Bld 137 (*)    Calcium 8.8 (*)    Albumin 3.3 (*)    Total Bilirubin 0.2 (*)    All other components within normal limits  CBC WITH DIFFERENTIAL/PLATELET - Abnormal; Notable for the following components:   Hemoglobin 10.0 (*)    HCT 32.4 (*)    MCV 77.9 (*)    MCH 24.0 (*)    RDW 16.4 (*)    All other components within normal limits  CBG MONITORING, ED - Abnormal; Notable for the following components:   Glucose-Capillary 119 (*)    All other components within normal limits  I-STAT CHEM 8, ED - Abnormal; Notable for the following components:   Glucose, Bld 138 (*)    Calcium, Ion 1.10 (*)    TCO2 21 (*)    Hemoglobin 10.5 (*)    HCT 31.0 (*)    All other components within normal limits  MAGNESIUM  LIPASE, BLOOD  TROPONIN I (HIGH SENSITIVITY)  TROPONIN I (HIGH SENSITIVITY)    EKG EKG Interpretation Date/Time:  Tuesday January 23 2023 09:11:22 EDT Ventricular Rate:  90 PR Interval:  198 QRS Duration:  108 QT Interval:  370 QTC Calculation: 391 R Axis:   34  Text Interpretation: Sinus rhythm Ventricular bigeminy Nonspecific repol abnormality, diffuse leads Confirmed by Gloris Manchester [id number]) on 01/23/2023 9:16:01 AM  Radiology DG Chest Portable 1 View  Result Date: 01/23/2023 CLINICAL DATA:  Chest pain. EXAM: PORTABLE CHEST 1 VIEW COMPARISON:  Nov 10, 2021. FINDINGS: Stable cardiomediastinal silhouette. Left-sided pacemaker is unchanged in position. Both lungs are clear. The visualized skeletal structures are unremarkable.  IMPRESSION: No active disease. Electronically Signed   By: Lupita Raider M.D.   On: 01/23/2023 09:58    Procedures Procedures    Medications Ordered in ED Medications - No data to display  ED Course/ Medical Decision Making/ A&P                             Medical Decision Making Amount and/or Complexity of Data Reviewed Labs: ordered. Radiology: ordered.   This patient presents to the ED for concern of chest pain, this involves an extensive number of treatment options, and is a complaint that carries with it a high risk of complications and morbidity.  The differential diagnosis includes ACS, GERD, pericarditis, anxiety   Co morbidities that complicate the patient evaluation  CAD, HLD, GERD, seizures, sick sinus syndrome s/p pacemaker placement, anemia, remote colon cancer, GI bleed, MGUS   Additional history obtained:  Additional history obtained from N/A External records from outside source obtained and reviewed including EMR   Lab Tests:  I Ordered, and personally interpreted labs.  The pertinent results include: Baseline microcytic anemia, no leukocytosis, normal electrolytes, normal troponins x 2   Imaging Studies ordered:  I ordered imaging studies including chest x-ray I independently visualized and interpreted imaging which showed no acute findings I agree with the radiologist interpretation   Cardiac Monitoring: / EKG:  The patient was maintained on a cardiac monitor.  I personally viewed and interpreted the cardiac monitored which showed an underlying rhythm of: Sinus rhythm with bigeminy   Problem List / ED Course / Critical interventions / Medication management  Patient presents for acute onset of chest pain this morning.  She was at rest at time  of onset.  She denies associated symptoms.  She received 1 SL NTG and 324 of ASA prior to arrival.  On arrival, she is pain-free.  She is well-appearing on exam.  Her breathing is unlabored.  Heart rate  sounds regular on auscultation.  Twelve-lead with EMS shows PVCs.  Initial EKG shows bigeminy.  Workup was initiated.  Initial lab work results are reassuring.  Initial troponin is 7.  Patient underwent pacemaker interrogation which showed device was functioning appropriately and did not record any recent events.  Patient remained asymptomatic while in the ED.  Repeat troponin is also 7.  Patient is stable for discharge with outpatient follow-up.  Cardiology referral was ordered.    Social Determinants of Health:  Has access to outpatient care        Final Clinical Impression(s) / ED Diagnoses Final diagnoses:  Chest pain, unspecified type    Rx / DC Orders ED Discharge Orders          Ordered    Ambulatory referral to Cardiology       Comments: If you have not heard from the Cardiology office within the next 72 hours please call [phone].   01/23/23 1219              Gloris Manchester, MD 01/23/23 1222

## 2023-01-23 NOTE — ED Triage Notes (Signed)
Per [facility] pt coming from home c/o chest pain onset of 8am. Describes it as dull and non radiating. Took 1 nitroglycerin and 324 aspirin at home. Pain now a 1/10 on arrival.

## 2023-01-23 NOTE — ED Notes (Signed)
[employer] pacemaker interrogated °

## 2023-01-23 NOTE — Telephone Encounter (Signed)
Pt's husband Rosanne Ashing called wanting to let Dr. [place] know the pt was having chest pains this morning and now their in the ED at [facility] [address]. He stated if any questions please call him.

## 2023-01-24 ENCOUNTER — Telehealth: Payer: Self-pay | Admitting: Cardiology

## 2023-01-24 ENCOUNTER — Other Ambulatory Visit: Payer: Self-pay | Admitting: Cardiology

## 2023-01-24 DIAGNOSIS — R079 Chest pain, unspecified: Secondary | ICD-10-CM | POA: Diagnosis not present

## 2023-01-24 NOTE — Telephone Encounter (Signed)
Called and straight to VM.  LM to call office.

## 2023-01-24 NOTE — Telephone Encounter (Signed)
Patient went to the ER yesterday and would like for the Dr to review patient's notes. Then give him a call to say rather she needs to come in for visit. Please advise

## 2023-01-25 NOTE — Telephone Encounter (Signed)
Called patient left message on personal voice mail calling to see how you are doing.Also time to schedule your Oct follow up appointment with Dr.Jordan.Advised to call back.

## 2023-01-26 ENCOUNTER — Telehealth: Payer: Self-pay | Admitting: *Deleted

## 2023-01-26 NOTE — Telephone Encounter (Signed)
   Pre-operative Risk Assessment    Patient Name: Rachel Clark  DOB: [birth year] MRN: [id number]    I WILL UPDATE THE REQUESTING OFFICE THE PT WAS JUST IN THE ED  FOR CHEST PAINS, SHE HAS A IN OFFICE FOLLOW UP 02/09/23  Request for Surgical Clearance    Procedure:   COLONOSCOPY/ENDOSCOPY  Date of Surgery:  Clearance 02/22/23                                 Surgeon:  DR. Bosie Clos Surgeon's Group or Practice Name:  [facility] Phone number:  [phone] Fax number:  [phone]   Type of Clearance Requested:   - Medical  - Pharmacy:  [facility] (Effient)     Type of Anesthesia:   PROPOFOL   Additional requests/questions:    Elpidio Anis   01/26/2023, 10:55 AM

## 2023-01-28 ENCOUNTER — Other Ambulatory Visit: Payer: Self-pay

## 2023-01-28 ENCOUNTER — Emergency Department (HOSPITAL_COMMUNITY)
Admission: EM | Admit: 2023-01-28 | Discharge: 2023-01-28 | Disposition: A | Discharge status: Home / Self Care | Payer: Medicare Other | Attending: Emergency Medicine | Admitting: Emergency Medicine

## 2023-01-28 ENCOUNTER — Emergency Department (HOSPITAL_COMMUNITY): Payer: Medicare Other

## 2023-01-28 DIAGNOSIS — I10 Essential (primary) hypertension: Secondary | ICD-10-CM | POA: Diagnosis not present

## 2023-01-28 DIAGNOSIS — R079 Chest pain, unspecified: Secondary | ICD-10-CM | POA: Diagnosis not present

## 2023-01-28 DIAGNOSIS — E039 Hypothyroidism, unspecified: Secondary | ICD-10-CM | POA: Diagnosis not present

## 2023-01-28 DIAGNOSIS — R0989 Other specified symptoms and signs involving the circulatory and respiratory systems: Secondary | ICD-10-CM | POA: Diagnosis not present

## 2023-01-28 DIAGNOSIS — Z85038 Personal history of other malignant neoplasm of large intestine: Secondary | ICD-10-CM | POA: Insufficient documentation

## 2023-01-28 DIAGNOSIS — I2510 Atherosclerotic heart disease of native coronary artery without angina pectoris: Secondary | ICD-10-CM | POA: Diagnosis not present

## 2023-01-28 DIAGNOSIS — R0789 Other chest pain: Secondary | ICD-10-CM | POA: Diagnosis not present

## 2023-01-28 DIAGNOSIS — K30 Functional dyspepsia: Secondary | ICD-10-CM | POA: Diagnosis not present

## 2023-01-28 DIAGNOSIS — E875 Hyperkalemia: Secondary | ICD-10-CM | POA: Diagnosis not present

## 2023-01-28 DIAGNOSIS — R001 Bradycardia, unspecified: Secondary | ICD-10-CM | POA: Diagnosis not present

## 2023-01-28 DIAGNOSIS — Z951 Presence of aortocoronary bypass graft: Secondary | ICD-10-CM | POA: Diagnosis not present

## 2023-01-28 DIAGNOSIS — I491 Atrial premature depolarization: Secondary | ICD-10-CM | POA: Diagnosis not present

## 2023-01-28 LAB — BASIC METABOLIC PANEL
Anion gap: 13 (ref 5–15)
BUN: 19 mg/dL (ref 8–23)
CO2: 19 mmol/L — ABNORMAL LOW (ref 22–32)
Calcium: 9.5 mg/dL (ref 8.9–10.3)
Chloride: 103 mmol/L (ref 98–111)
Creatinine, Ser: 0.86 mg/dL (ref 0.44–1.00)
GFR, Estimated: >60 mL/min (ref >60)
Glucose, Bld: 118 mg/dL — ABNORMAL HIGH (ref 70–99)
Potassium: 5.2 mmol/L — ABNORMAL HIGH (ref 3.5–5.1)
Sodium: 135 mmol/L (ref 135–145)

## 2023-01-28 LAB — HEPATIC FUNCTION PANEL
ALT: 13 U/L (ref 0–44)
AST: 20 U/L (ref 15–41)
Albumin: 3.2 g/dL — ABNORMAL LOW (ref 3.5–5.0)
Alkaline Phosphatase: 50 U/L (ref 38–126)
Bilirubin, Direct: <0.1 mg/dL (ref 0.0–0.2)
Total Bilirubin: 0.4 mg/dL (ref 0.3–1.2)
Total Protein: 6.5 g/dL (ref 6.5–8.1)

## 2023-01-28 LAB — CBC
HCT: 30.4 % — ABNORMAL LOW (ref 36.0–46.0)
Hemoglobin: 9.4 g/dL — ABNORMAL LOW (ref 12.0–15.0)
MCH: 24.3 pg — ABNORMAL LOW (ref 26.0–34.0)
MCHC: 30.9 g/dL (ref 30.0–36.0)
MCV: 78.6 fL — ABNORMAL LOW (ref 80.0–100.0)
Platelets: 245 10*3/uL (ref 150–400)
RBC: 3.87 MIL/uL (ref 3.87–5.11)
RDW: 16.2 % — ABNORMAL HIGH (ref 11.5–15.5)
WBC: 5.9 10*3/uL (ref 4.0–10.5)
nRBC: 0 % (ref 0.0–0.2)

## 2023-01-28 LAB — LIPASE, BLOOD: Lipase: 35 U/L (ref 11–51)

## 2023-01-28 LAB — TROPONIN I (HIGH SENSITIVITY)
Troponin I (High Sensitivity): 8 ng/L (ref <18)
Troponin I (High Sensitivity): 8 ng/L (ref <18)

## 2023-01-28 MED ORDER — FAMOTIDINE IN NACL 20-0.9 MG/50ML-% IV SOLN
20.0000 mg | Freq: Once | INTRAVENOUS | Status: AC
  Administered 2023-01-28: 20 mg via INTRAVENOUS
  Filled 2023-01-28: qty 50

## 2023-01-28 MED ORDER — LIDOCAINE VISCOUS HCL 2 % MT SOLN
15.0000 mL | Freq: Once | OROMUCOSAL | Status: AC
  Administered 2023-01-28: 15 mL via ORAL
  Filled 2023-01-28: qty 15

## 2023-01-28 MED ORDER — SODIUM CHLORIDE 0.9 % IV BOLUS
1000.0000 mL | Freq: Once | INTRAVENOUS | Status: AC
  Administered 2023-01-28: 1000 mL via INTRAVENOUS

## 2023-01-28 MED ORDER — ALUM & MAG HYDROXIDE-SIMETH 200-200-20 MG/5ML PO SUSP
30.0000 mL | Freq: Once | ORAL | Status: AC
  Administered 2023-01-28: 30 mL via ORAL
  Filled 2023-01-28: qty 30

## 2023-01-28 MED ORDER — SODIUM ZIRCONIUM CYCLOSILICATE 10 G PO PACK
10.0000 g | PACK | Freq: Once | ORAL | Status: AC
  Administered 2023-01-28: 10 g via ORAL
  Filled 2023-01-28: qty 1

## 2023-01-28 MED ORDER — SUCRALFATE 1 G PO TABS: 1.0000 g | ORAL_TABLET | Freq: Three times a day (TID) | ORAL | 0 refills | Status: DC

## 2023-01-28 NOTE — ED Triage Notes (Signed)
Patient with central non radiating chest pain since this morning. Took one nitroglycerin with improvement in pain from 10/10 to 6/10. Pain still present this afternoon so EMS was called and patient called EMS. Took ASA prior to EMS arrival. 3 more nitroglycerin given in route with improvement after each but pain returns. Denies associated symptoms. Patient in bigeminy per EMS.

## 2023-01-28 NOTE — Discharge Instructions (Addendum)
of breath, nausea, vomiting, burping, heartburn, tingling upper body parts, sweating, cold, clammy skin, or racing heartbeat. Call 911 if you think you are having a heart attack. Follow cardiac diet - avoid fatty & fried foods, don't eat too much red meat, eat lots of fruits & vegetables, and dairy products should be low fat. Please lose weight if you are overweight. Become more active with walking, gardening, or any other activity that gets you to moving.   Please return to the emergency department immediately for any new or concerning symptoms, or if you get worse.

## 2023-01-28 NOTE — ED Provider Notes (Signed)
[place] EMERGENCY DEPARTMENT AT [facility] Provider Note  CSN: [id number] Arrival date & time: 01/28/23 1812  Chief Complaint(s) Chest Pain  HPI Rachel Clark is a 72 y.o. female with past medical history as below, significant for chronic back pain, CHB, CAD, GERD, epilepsy on tegretol, HLD, HTN, PPM who presents to the ED with complaint of cp. Pain mid sternal, non radiating, has had intermittently over last few weeks but feels somewhat worse today. No n/v, no dib or syncope/near syncope, no diaphoresis. Took NTG which did provide some relief, no change with TUMS yesterday. Not worsened w/ exertion, not described as pleuritic, reports similar to pain she has had previously  Past Medical History Past Medical History:  Diagnosis Date   Arthritis    "fingers" (06/29/2016)   Chronic lower back pain    Colon cancer (HCC) 12/04/2011   s/p Laparoscopic-assisted transverse colectomy on 12/19/2011 by Dr. Dwain Sarna.  pT3 N0 M0.    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   last seizure was in 1972 ;takes Tegretol (06/29/2016)    Headache    "weekly" (06/29/2016)   Hyperlipidemia    Hyponatremia    Hypothyroid    Iron deficiency anemia 11/17/2011   Memory change 11/13/2016   Monoclonal gammopathy of unknown significance    Orthostatic hypotension    Presence of permanent cardiac pacemaker    Syncope and collapse    pacemaker implanted   Patient Active Problem List   Diagnosis Date Noted   Right leg pain 01/13/2022   Hypocalcemia 01/13/2022   NSTEMI (non-ST elevated myocardial infarction) (HCC) 11/11/2021   Presence of stent in left circumflex coronary artery    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    Non-ST elevation (NSTEMI) myocardial infarction (HCC) 11/10/2021   Acute  upper GI bleed 03/13/2021   GI bleed 03/13/2021   Preventive measure 01/14/2021   Orthostatic hypotension 12/21/2020   Memory change 11/13/2016   Acute bronchitis 03/23/2016   Chest pain with high risk for cardiac etiology 11/05/2015   Essential hypertension 08/25/2015   Sick sinus syndrome (HCC) 08/16/2015   Syncope and collapse 08/10/2015   CAD, multiple vessel    Chest pain    Cardiac pacemaker in situ 07/25/2015   Angina pectoris (HCC) 03/24/2015   Unstable angina (HCC) 10/26/2014   Seizures (HCC) 02/19/2014   Lung nodule 02/06/2013   Thyroid nodule 02/06/2013   History of colon cancer 12/04/2011   Iron deficiency anemia due to chronic blood loss 11/17/2011   GERD (gastroesophageal reflux disease) 08/17/2011   Monoclonal gammopathy of unknown significance    Hypothyroid    Coronary artery disease, post CABG 2007     Dyslipidemia    Home Medication(s) Prior to Admission medications   Medication Sig Start Date End Date Taking? Authorizing Provider  sucralfate (CARAFATE) 1 g tablet Take 1 tablet (1 g total) by mouth with breakfast, with lunch, and with evening meal for 7 days. 01/28/23 02/04/23 Yes Sloan Leiter, DO  acetaminophen (TYLENOL) 500 MG tablet Take 500-1,000 mg by mouth daily as needed for mild pain or headache.    [provider]  aspirin 81 MG chewable tablet Chew 1 tablet (81 mg total) by mouth daily. 06/30/16   Katrinka Blazing,  Oswaldo Conroy, NP  Azelastine HCl 0.15 % SOLN Place 1 spray into both nostrils daily as needed (seasonal allergies). 12/22/14   [provider]  Bempedoic Acid-Ezetimibe (NEXLIZET) 180-10 MG TABS Take 180 mg by mouth daily. 04/25/22   [place], Peter M, MD  calcium carbonate (TUMS - DOSED IN MG ELEMENTAL CALCIUM) 500 MG chewable tablet Chew 500 mg by mouth daily.    [provider]  carbamazepine (TEGRETOL XR) 200 MG 12 hr tablet Take 3 tablets (600 mg total) by mouth 2 (two) times daily. 09/19/22 12/13/23  Glean Salvo, NP   Cholecalciferol (VITAMIN D) 50 MCG (2000 UT) CAPS Take 4,000 Units by mouth daily.    [provider]  diphenhydrAMINE (BENADRYL) 25 MG tablet Take 25 mg by mouth daily.    [provider]  diphenhydramine-acetaminophen (TYLENOL PM) 25-500 MG TABS tablet Take 1 tablet by mouth at bedtime.    [provider]  DULoxetine (CYMBALTA) 60 MG capsule Take 60 mg by mouth 2 (two) times daily.    [provider]  ethosuximide (ZARONTIN) 250 MG capsule TAKE 2 CAPSULES IN THE MORNING, TAKE 2 CAPSULES AT NIGHT 07/11/22   Glean Salvo, NP  Evolocumab (REPATHA SURECLICK) 140 MG/ML SOAJ Inject 140 mg into the skin every 14 (fourteen) days. 12/25/22   [place], Peter M, MD  ferrous sulfate 325 (65 FE) MG EC tablet Take 325 mg by mouth daily with breakfast.    [provider]  fluticasone (FLONASE) 50 MCG/ACT nasal spray Place 1 spray into both nostrils daily as needed for allergies.    [provider]  isosorbide mononitrate (IMDUR) 30 MG 24 hr tablet TAKE HALF TABLET (15MG  TOTAL) BY MOUTH EVERY DAY 04/20/22   [place], Peter M, MD  levothyroxine (SYNTHROID, LEVOTHROID) 50 MCG tablet Take 50 mcg by mouth daily before breakfast.    [provider]  memantine (NAMENDA) 10 MG tablet Take 1 tablet (10 mg total) by mouth 2 (two) times daily. 09/14/22   Glean Salvo, NP  nitroGLYCERIN (NITROSTAT) 0.4 MG SL tablet place 1 tablet under the tongue every 5 minutes as needed for chest pain 01/24/23   [place], Peter M, MD  Oral Electrolytes (THERMOTABS PO) Take 452 mg by mouth daily. Thermotabs 452 mg sodium chloride    [provider]  pantoprazole (PROTONIX) 40 MG tablet TAKE ONE TABLET BY MOUTH TWICE DAILY FOR 30 DAYS THEN TAKE ONE TABLET BY MOUTH DAILY 02/08/22   [place], Peter M, MD  prasugrel (EFFIENT) 10 MG TABS tablet Take 1 tablet (10 mg total) by mouth daily. 04/25/22   [place], Peter M, MD                                                                                                                                     Past Surgical History Past Surgical History:  Procedure Laterality Date   ANTERIOR CERVICAL DECOMP/DISCECTOMY FUSION  1980's   BACK SURGERY     CARDIAC CATHETERIZATION  05/20/2009   obstructive native vessel disease in LAD, RCA, and first diagonal, patent vein graft to distal RCA and LIMA to LAD,normal. ef 60%   CARDIAC CATHETERIZATION N/A 03/24/2015   Procedure: Left Heart Cath and Cors/Grafts Angiography;  Surgeon: Iran Ouch, MD;  Location: [facility] INVASIVE CV LAB;  Service: Cardiovascular;  Laterality: N/A;   CARDIAC CATHETERIZATION N/A 07/28/2015   Procedure: Left Heart Cath and Coronary Angiography;  Surgeon: Lennette Bihari, MD;  Location: [facility] INVASIVE CV LAB;  Service: Cardiovascular;  Laterality: N/A;   CARDIAC CATHETERIZATION N/A 11/09/2015   Procedure: Coronary Stent Intervention;  Surgeon: Tonny Bollman, MD;  Location: [facility] INVASIVE CV LAB;  Service: Cardiovascular;  Laterality: N/A;   CARDIAC CATHETERIZATION N/A 11/09/2015   Procedure: Left Heart Cath and Coronary Angiography;  Surgeon: Tonny Bollman, MD;  Location: [facility] INVASIVE CV LAB;  Service: Cardiovascular;  Laterality: N/A;   CARDIAC CATHETERIZATION N/A 06/29/2016   Procedure: Left Heart Cath and Coronary Angiography;  Surgeon: Yvonne Kendall, MD;  Location: [facility] INVASIVE CV LAB;  Service: Cardiovascular;  Laterality: N/A;   CARDIAC CATHETERIZATION N/A 06/29/2016   Procedure: Intravascular Pressure Wire/FFR Study;  Surgeon: Yvonne Kendall, MD;  Location: [facility] INVASIVE CV LAB;  Service: Cardiovascular;  Laterality: N/A;   CARDIAC CATHETERIZATION N/A 06/29/2016   Procedure: Coronary Balloon Angioplasty;  Surgeon: Yvonne Kendall, MD;  Location: [facility] INVASIVE CV LAB;  Service: Cardiovascular;  Laterality: N/A;   COLON RESECTION  12/19/2011   Procedure: COLON RESECTION LAPAROSCOPIC;  Surgeon: Emelia Loron, MD;  Location: [facility] OR;  Service: General;  Laterality: N/A;   laparoscopic hand assisted partial colon resection   COLON SURGERY     COLONOSCOPY     CORONARY ANGIOPLASTY WITH STENT PLACEMENT  ~ 2007   1 stent   CORONARY ARTERY BYPASS GRAFT  2007   "CABG X2"   DILATION AND CURETTAGE OF UTERUS  1973   EP IMPLANTABLE DEVICE N/A 03/25/2015   MDT Advisa DR pacemaker implanted by Dr Johney Frame for transient complete heart block and syncope   ESOPHAGOGASTRODUODENOSCOPY (EGD) WITH PROPOFOL N/A 03/14/2021   Procedure: ESOPHAGOGASTRODUODENOSCOPY (EGD) WITH PROPOFOL;  Surgeon: Kerin Salen, MD;  Location: [facility] ENDOSCOPY;  Service: Gastroenterology;  Laterality: N/A;   ESOPHAGOGASTRODUODENOSCOPY (EGD) WITH PROPOFOL N/A 11/13/2021   Procedure: ESOPHAGOGASTRODUODENOSCOPY (EGD) WITH PROPOFOL;  Surgeon: Kathi Der, MD;  Location: [facility] ENDOSCOPY;  Service: Gastroenterology;  Laterality: N/A;   HEMOSTASIS CLIP PLACEMENT  03/14/2021   Procedure: HEMOSTASIS CLIP PLACEMENT;  Surgeon: Kerin Salen, MD;  Location: [facility] ENDOSCOPY;  Service: Gastroenterology;;   INSERT / REPLACE / REMOVE PACEMAKER     LEFT HEART CATH AND CORONARY ANGIOGRAPHY N/A 11/11/2021   Procedure: LEFT HEART CATH AND CORONARY ANGIOGRAPHY;  Surgeon: Marykay Lex, MD;  Location: [facility] INVASIVE CV LAB;  Service: Cardiovascular;  Laterality: N/A;   LEFT HEART CATH AND CORS/GRAFTS ANGIOGRAPHY N/A 02/21/2018   Procedure: LEFT HEART CATH AND CORS/GRAFTS ANGIOGRAPHY;  Surgeon: Runell Gess, MD;  Location: [facility] INVASIVE CV LAB;  Service: Cardiovascular;  Laterality: N/A;   LEFT HEART CATH AND CORS/GRAFTS ANGIOGRAPHY N/A 03/08/2021   Procedure: LEFT HEART CATH AND CORS/GRAFTS ANGIOGRAPHY;  Surgeon: [place], Peter M, MD;  Location: [facility] INVASIVE CV LAB;  Service: Cardiovascular;  Laterality: N/A;   PORT-A-CATH REMOVAL N/A 06/12/2013   Procedure: REMOVAL PORT-A-CATH;  Surgeon: Emelia Loron, MD;  Location: [facility];  Service: General;  Laterality: N/A;   PORTACATH PLACEMENT  06/04/2012  Procedure: INSERTION  PORT-A-CATH;  Surgeon: Emelia Loron, MD;  Location: [facility] OR;  Service: General;  Laterality: N/A;  Insertion of port-a-cath    POSTERIOR LAMINECTOMY / DECOMPRESSION CERVICAL SPINE  1990s   SCLEROTHERAPY  03/14/2021   Procedure: SCLEROTHERAPY;  Surgeon: Kerin Salen, MD;  Location: [facility] ENDOSCOPY;  Service: Gastroenterology;;   VAGINAL HYSTERECTOMY     Family History Family History  Problem Relation Age of Onset   Heart attack Father    Heart disease Father    Heart attack Brother 66   Cancer Mother        lung   Stroke Neg Hx     Social History Social History   Tobacco Use   Smoking status: Former    Current packs/day: 0.00    Average packs/day: 0.3 packs/day for 20.0 years (6.0 ttl pk-yrs)    Types: Cigarettes    Start date: 06/06/1961    Quit date: 06/06/1981    Years since quitting: 41.6   Smokeless tobacco: Never  Vaping Use   Vaping status: Never Used  Substance Use Topics   Alcohol use: Yes    Alcohol/week: 1.0 standard drink of alcohol    Types: 1 Glasses of wine per week    Comment: occ   Drug use: No   Allergies Sulfamethoxazole, Aspirin, Crestor [rosuvastatin], Erythromycin, Lisinopril, Niacin and related, Penicillin g benzathine, Statins, Sulfa drugs cross reactors, Tetracyclines & related, Xeloda [capecitabine], Penicillins, and Plavix [clopidogrel bisulfate]  Review of Systems Review of Systems  Constitutional:  Negative for activity change and fever.  HENT:  Negative for facial swelling and trouble swallowing.   Eyes:  Negative for discharge and redness.  Respiratory:  Negative for cough and shortness of breath.   Cardiovascular:  Positive for chest pain. Negative for palpitations.  Gastrointestinal:  Negative for abdominal pain and nausea.  Genitourinary:  Negative for dysuria and flank pain.  Musculoskeletal:  Negative for back pain and gait problem.  Skin:  Negative for pallor and rash.  Neurological:  Negative for syncope and headaches.     Physical Exam Vital Signs  I have reviewed the triage vital signs BP (!) 159/58   Pulse 77   Temp 98.1 F (36.7 C) (Oral)   Resp 14   SpO2 98%  Physical Exam Vitals and nursing note reviewed.  Constitutional:      General: She is not in acute distress.    Appearance: Normal appearance. She is not ill-appearing.  HENT:     Head: Normocephalic and atraumatic.     Right Ear: External ear normal.     Left Ear: External ear normal.     Nose: Nose normal.     Mouth/Throat:     Mouth: Mucous membranes are moist.  Eyes:     General: No scleral icterus.       Right eye: No discharge.        Left eye: No discharge.  Cardiovascular:     Rate and Rhythm: Normal rate and regular rhythm.     Pulses: Normal pulses.     Heart sounds: Normal heart sounds.  Pulmonary:     Effort: Pulmonary effort is normal. No respiratory distress.     Breath sounds: Normal breath sounds. No stridor.  Abdominal:     General: Abdomen is flat. There is no distension.     Palpations: Abdomen is soft.     Tenderness: There is no abdominal tenderness.  Musculoskeletal:     Cervical back: No rigidity.  Right lower leg: No edema.     Left lower leg: No edema.  Skin:    General: Skin is warm and dry.     Capillary Refill: Capillary refill takes less than 2 seconds.  Neurological:     Mental Status: She is alert.  Psychiatric:        Mood and Affect: Mood normal.        Behavior: Behavior normal. Behavior is cooperative.     ED Results and Treatments Labs (all labs ordered are listed, but only abnormal results are displayed) Labs Reviewed  BASIC METABOLIC PANEL - Abnormal; Notable for the following components:      Result Value   Potassium 5.2 (*)    CO2 19 (*)    Glucose, Bld 118 (*)    All other components within normal limits  CBC - Abnormal; Notable for the following components:   Hemoglobin 9.4 (*)    HCT 30.4 (*)    MCV 78.6 (*)    MCH 24.3 (*)    RDW 16.2 (*)    All other  components within normal limits  HEPATIC FUNCTION PANEL - Abnormal; Notable for the following components:   Albumin 3.2 (*)    All other components within normal limits  LIPASE, BLOOD  TROPONIN I (HIGH SENSITIVITY)  TROPONIN I (HIGH SENSITIVITY)                                                                                                                          Radiology DG Chest 2 View  Result Date: 01/28/2023 CLINICAL DATA:  Chest pain since yesterday. EXAM: CHEST - 2 VIEW COMPARISON:  One-view chest x-ray 01/23/2023 FINDINGS: The heart size is upper limits of normal. Atherosclerotic changes are present at the aortic arch. Pacing wires are stable. Lung volumes are low. No edema or effusions are present. No focal airspace disease is present. The visualized soft tissues and bony thorax are unremarkable. IMPRESSION: No active cardiopulmonary disease. Electronically Signed   By: Marin Roberts M.D.   On: 01/28/2023 19:32    Pertinent labs & imaging results that were available during my care of the patient were reviewed by me and considered in my medical decision making (see MDM for details).  Medications Ordered in ED Medications  alum & mag hydroxide-simeth (MAALOX/MYLANTA) 200-200-20 MG/5ML suspension 30 mL (30 mLs Oral Given 01/28/23 2115)    And  lidocaine (XYLOCAINE) 2 % viscous mouth solution 15 mL (15 mLs Oral Given 01/28/23 2115)  famotidine (PEPCID) IVPB 20 mg premix (0 mg Intravenous Stopped 01/28/23 2203)  sodium chloride 0.9 % bolus 1,000 mL (0 mLs Intravenous Stopped 01/28/23 2309)  sodium zirconium cyclosilicate (LOKELMA) packet 10 g (10 g Oral Given 01/28/23 2308)  Procedures Procedures  (including critical care time)  Medical Decision Making / ED Course    Medical Decision Making:    CHAD SALVESEN is a 72 y.o. female with history  as above here with cc of chest pain. The complaint involves an extensive differential diagnosis and also carries with it a high risk of complications and morbidity.  Serious etiology was considered. Ddx includes but is not limited to: Differential includes all life-threatening causes for chest pain. This includes but is not exclusive to acute coronary syndrome, aortic dissection, pulmonary embolism, cardiac tamponade, community-acquired pneumonia, pericarditis, musculoskeletal chest wall pain, etc.   Complete initial physical exam performed, notably the patient  was NAD, has pain to chest on exam, HDS.    Reviewed and confirmed nursing documentation for past medical history, family history, social history.  Vital signs reviewed.    Clinical Course as of 01/29/23 0035  Wynelle Link Jan 28, 2023  2223 Feeling better on recheck [SG]  Mon Jan 29, 2023  0032 Hemoglobin(!): 9.4 Similar to prior  [SG]  0032 Potassium(!): 5.2 Given lokelma  [SG]    Clinical Course User Index [SG] Tanda Rockers A, DO    Pt here with recurrent chest pain Some improvement with NTG but minimal K is high, give lokelma Hgb similar to baseline Labs stable o/w Trop neg x2 EKG without ischemia Symptoms resolved with gi cocktail Start carafate for home, dietary changes, she has upcoming appt with GI, recommend she keep this (was referred by oncology for anemia per spouse) She has follow up with cardiology already from recent ED visit  Favor atypical chest pain Low suspicion for ACS at this time  HEART score is 4, she has F/u in early August with cardiologist office Recommend f/u with pcp in next week for recheck of potassium  The patient improved significantly and was discharged in stable condition. Detailed discussions were had with the patient regarding current findings, and need for close f/u with PCP or on call doctor. The patient has been instructed to return immediately if the symptoms worsen in any way for  re-evaluation. Patient verbalized understanding and is in agreement with current care plan. All questions answered prior to discharge.        Additional history obtained: -Additional history obtained from spouse -External records from outside source obtained and reviewed including: Chart review including previous notes, labs, imaging, consultation notes including home mediations, primary care documentation, seen in ED 7/23 similar complaint - visit reviewed    Lab Tests: -I ordered, reviewed, and interpreted labs.   The pertinent results include:   Labs Reviewed  BASIC METABOLIC PANEL - Abnormal; Notable for the following components:      Result Value   Potassium 5.2 (*)    CO2 19 (*)    Glucose, Bld 118 (*)    All other components within normal limits  CBC - Abnormal; Notable for the following components:   Hemoglobin 9.4 (*)    HCT 30.4 (*)    MCV 78.6 (*)    MCH 24.3 (*)    RDW 16.2 (*)    All other components within normal limits  HEPATIC FUNCTION PANEL - Abnormal; Notable for the following components:   Albumin 3.2 (*)    All other components within normal limits  LIPASE, BLOOD  TROPONIN I (HIGH SENSITIVITY)  TROPONIN I (HIGH SENSITIVITY)    Notable for trop neg x2  EKG   EKG Interpretation Date/Time:  Sunday January 28 2023 18:24:31 EDT Ventricular Rate:  82 PR Interval:  174 QRS Duration:  88 QT Interval:  372 QTC Calculation: 434 R Axis:   46  Text Interpretation: Sinus rhythm with frequent Premature ventricular complexes Nonspecific ST abnormality Abnormal ECG When compared with ECG of 23-Jan-2023 09:32, PREVIOUS ECG IS PRESENT Confirmed by Tanda Rockers ([id number]) on 01/28/2023 7:22:16 PM         Imaging Studies ordered: I ordered imaging studies including CXR I independently visualized the following imaging with scope of interpretation limited to determining acute life threatening conditions related to emergency care; findings noted above, significant for  stable imaging  I independently visualized and interpreted imaging. I agree with the radiologist interpretation   Medicines ordered and prescription drug management: Meds ordered this encounter  Medications   AND Linked Order Group    alum & mag hydroxide-simeth (MAALOX/MYLANTA) 200-200-20 MG/5ML suspension 30 mL    lidocaine (XYLOCAINE) 2 % viscous mouth solution 15 mL   famotidine (PEPCID) IVPB 20 mg premix   sodium chloride 0.9 % bolus 1,000 mL   sodium zirconium cyclosilicate (LOKELMA) packet 10 g   sucralfate (CARAFATE) 1 g tablet    Sig: Take 1 tablet (1 g total) by mouth with breakfast, with lunch, and with evening meal for 7 days.    Dispense:  21 tablet    Refill:  0    -I have reviewed the patients home medicines and have made adjustments as needed   Consultations Obtained: na   Cardiac Monitoring: The patient was maintained on a cardiac monitor.  I personally viewed and interpreted the cardiac monitored which showed an underlying rhythm of: NSR  Social Determinants of Health:  Diagnosis or treatment significantly limited by social determinants of health: former smoker   Reevaluation: After the interventions noted above, I reevaluated the patient and found that they have resolved  Co morbidities that complicate the patient evaluation  Past Medical History:  Diagnosis Date   Arthritis    "fingers" (06/29/2016)   Chronic lower back pain    Colon cancer (HCC) 12/04/2011   s/p Laparoscopic-assisted transverse colectomy on 12/19/2011 by Dr. Dwain Sarna.  pT3 N0 M0.    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    Final Clinical Impression(s) / ED Diagnoses Final diagnoses:  Atypical chest pain  Indigestion     This chart was dictated using voice recognition software.  Despite best efforts to proofread,  errors can occur which can change the documentation meaning.    Sloan Leiter, DO 01/29/23 [phone]

## 2023-01-29 ENCOUNTER — Other Ambulatory Visit: Payer: Self-pay | Admitting: Cardiology

## 2023-01-29 DIAGNOSIS — R0789 Other chest pain: Secondary | ICD-10-CM | POA: Diagnosis not present

## 2023-01-29 NOTE — Telephone Encounter (Signed)
Patient never returned call.Patient has appointment with Bernadene Person NP 8/9 at 10:55 am.

## 2023-01-31 DIAGNOSIS — F331 Major depressive disorder, recurrent, moderate: Secondary | ICD-10-CM | POA: Diagnosis not present

## 2023-02-02 ENCOUNTER — Ambulatory Visit (INDEPENDENT_AMBULATORY_CARE_PROVIDER_SITE_OTHER): Payer: Medicare Other | Admitting: Internal Medicine

## 2023-02-02 VITALS — BP 128/62 | HR 84 | Ht 68.0 in | Wt 182.0 lb

## 2023-02-02 DIAGNOSIS — G40409 Other generalized epilepsy and epileptic syndromes, not intractable, without status epilepticus: Secondary | ICD-10-CM | POA: Diagnosis not present

## 2023-02-02 DIAGNOSIS — K2970 Gastritis, unspecified, without bleeding: Secondary | ICD-10-CM | POA: Diagnosis not present

## 2023-02-02 DIAGNOSIS — R079 Chest pain, unspecified: Secondary | ICD-10-CM

## 2023-02-02 DIAGNOSIS — I951 Orthostatic hypotension: Secondary | ICD-10-CM

## 2023-02-02 DIAGNOSIS — I495 Sick sinus syndrome: Secondary | ICD-10-CM | POA: Diagnosis not present

## 2023-02-02 DIAGNOSIS — E785 Hyperlipidemia, unspecified: Secondary | ICD-10-CM | POA: Diagnosis not present

## 2023-02-02 DIAGNOSIS — D509 Iron deficiency anemia, unspecified: Secondary | ICD-10-CM | POA: Diagnosis not present

## 2023-02-02 DIAGNOSIS — Z79899 Other long term (current) drug therapy: Secondary | ICD-10-CM

## 2023-02-02 DIAGNOSIS — K3189 Other diseases of stomach and duodenum: Secondary | ICD-10-CM | POA: Diagnosis not present

## 2023-02-02 DIAGNOSIS — F32A Depression, unspecified: Secondary | ICD-10-CM | POA: Diagnosis not present

## 2023-02-02 DIAGNOSIS — R413 Other amnesia: Secondary | ICD-10-CM | POA: Diagnosis present

## 2023-02-02 DIAGNOSIS — I442 Atrioventricular block, complete: Secondary | ICD-10-CM | POA: Diagnosis not present

## 2023-02-02 DIAGNOSIS — Z950 Presence of cardiac pacemaker: Secondary | ICD-10-CM | POA: Diagnosis not present

## 2023-02-02 DIAGNOSIS — R0789 Other chest pain: Secondary | ICD-10-CM | POA: Diagnosis not present

## 2023-02-02 DIAGNOSIS — D649 Anemia, unspecified: Secondary | ICD-10-CM | POA: Diagnosis not present

## 2023-02-02 DIAGNOSIS — Z7902 Long term (current) use of antithrombotics/antiplatelets: Secondary | ICD-10-CM | POA: Diagnosis not present

## 2023-02-02 DIAGNOSIS — I25709 Atherosclerosis of coronary artery bypass graft(s), unspecified, with unspecified angina pectoris: Secondary | ICD-10-CM | POA: Diagnosis not present

## 2023-02-02 DIAGNOSIS — I10 Essential (primary) hypertension: Secondary | ICD-10-CM | POA: Diagnosis not present

## 2023-02-02 DIAGNOSIS — M797 Fibromyalgia: Secondary | ICD-10-CM | POA: Diagnosis present

## 2023-02-02 DIAGNOSIS — R008 Other abnormalities of heart beat: Secondary | ICD-10-CM | POA: Diagnosis not present

## 2023-02-02 DIAGNOSIS — I491 Atrial premature depolarization: Secondary | ICD-10-CM | POA: Diagnosis not present

## 2023-02-02 DIAGNOSIS — Z87891 Personal history of nicotine dependence: Secondary | ICD-10-CM | POA: Diagnosis not present

## 2023-02-02 DIAGNOSIS — Z79890 Hormone replacement therapy (postmenopausal): Secondary | ICD-10-CM | POA: Diagnosis not present

## 2023-02-02 DIAGNOSIS — K209 Esophagitis, unspecified without bleeding: Secondary | ICD-10-CM | POA: Diagnosis not present

## 2023-02-02 DIAGNOSIS — D500 Iron deficiency anemia secondary to blood loss (chronic): Secondary | ICD-10-CM | POA: Diagnosis not present

## 2023-02-02 DIAGNOSIS — Z886 Allergy status to analgesic agent status: Secondary | ICD-10-CM | POA: Diagnosis not present

## 2023-02-02 DIAGNOSIS — K259 Gastric ulcer, unspecified as acute or chronic, without hemorrhage or perforation: Secondary | ICD-10-CM | POA: Diagnosis not present

## 2023-02-02 DIAGNOSIS — D62 Acute posthemorrhagic anemia: Secondary | ICD-10-CM | POA: Diagnosis not present

## 2023-02-02 DIAGNOSIS — Z85038 Personal history of other malignant neoplasm of large intestine: Secondary | ICD-10-CM | POA: Diagnosis not present

## 2023-02-02 DIAGNOSIS — Z8679 Personal history of other diseases of the circulatory system: Secondary | ICD-10-CM | POA: Diagnosis not present

## 2023-02-02 DIAGNOSIS — Z951 Presence of aortocoronary bypass graft: Secondary | ICD-10-CM | POA: Diagnosis not present

## 2023-02-02 DIAGNOSIS — Z981 Arthrodesis status: Secondary | ICD-10-CM | POA: Diagnosis not present

## 2023-02-02 DIAGNOSIS — E039 Hypothyroidism, unspecified: Secondary | ICD-10-CM | POA: Diagnosis not present

## 2023-02-02 DIAGNOSIS — I2510 Atherosclerotic heart disease of native coronary artery without angina pectoris: Secondary | ICD-10-CM | POA: Diagnosis not present

## 2023-02-02 MED ORDER — NITROGLYCERIN 0.4 MG SL SUBL
0.4000 mg | SUBLINGUAL_TABLET | SUBLINGUAL | Status: AC | PRN
Start: 2023-02-02 — End: 2023-02-02

## 2023-02-02 MED ORDER — RANOLAZINE ER 500 MG PO TB12: 500.0000 mg | ORAL_TABLET | Freq: Two times a day (BID) | ORAL | 3 refills | Status: DC

## 2023-02-02 NOTE — Progress Notes (Unsigned)
Patient Care Team: Laurann Montana, MD as PCP - General (Family Medicine) [place], Peter M, MD as PCP - Cardiology (Cardiology) Charlott Rakes, MD as Consulting Physician (Gastroenterology)   HPI  Rachel Clark is a 72 y.o. female former pt of [facility] with pacemaker  Medtr[employer] for sick sinus syndrome  History of coronary artery disease with prior CABG 2007, PCI 2007 teen, 2019 and 2022/.23 the latter complicated by GI bleed     ER evaluation 7/24 for recurrent chest pain; troponin is negative  Complaining again of chest pain, similar to her prior MI  Ongoing anemia, with microcytosis,   Dyspnea stable   DATE TEST EF   5/23 Echo  60-*65%   5/23 LHC  CX-ISR>lithotripsy>STENT   Date Cr K Hgb  7/23   12.3 (85-MCV)  7/24 0.86 5.2 9.4 (78 ([facility])               Records and Results Reviewed   Past Medical History:  Diagnosis Date   Arthritis    "fingers" (06/29/2016)   Chronic lower back pain    Colon cancer (HCC) 12/04/2011   s/p Laparoscopic-assisted transverse colectomy on 12/19/2011 by Dr. Dwain Sarna.  pT3 N0 M0.    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   last seizure was in 1972 ;takes Tegretol (06/29/2016)    Headache    "weekly" (06/29/2016)   Hyperlipidemia    Hyponatremia    Hypothyroid    Iron deficiency anemia 11/17/2011   Memory change 11/13/2016   Monoclonal gammopathy of unknown significance    Orthostatic hypotension    Presence of permanent cardiac pacemaker    Syncope and collapse    pacemaker implanted    Past Surgical History:  Procedure Laterality Date   ANTERIOR CERVICAL DECOMP/DISCECTOMY FUSION  1980's   BACK SURGERY     CARDIAC CATHETERIZATION  05/20/2009   obstructive native vessel disease in LAD, RCA, and first diagonal, patent vein graft  to distal RCA and LIMA to LAD,normal. ef 60%   CARDIAC CATHETERIZATION N/A 03/24/2015   Procedure: Left Heart Cath and Cors/Grafts Angiography;  Surgeon: Iran Ouch, MD;  Location: [facility] INVASIVE CV LAB;  Service: Cardiovascular;  Laterality: N/A;   CARDIAC CATHETERIZATION N/A 07/28/2015   Procedure: Left Heart Cath and Coronary Angiography;  Surgeon: Lennette Bihari, MD;  Location: [facility];  Service: Cardiovascular;  Laterality: N/A;   CARDIAC CATHETERIZATION N/A 11/09/2015   Procedure: Coronary Stent Intervention;  Surgeon: Tonny Bollman, MD;  Location: [facility] INVASIVE CV LAB;  Service: Cardiovascular;  Laterality: N/A;   CARDIAC CATHETERIZATION N/A 11/09/2015   Procedure: Left Heart Cath and Coronary Angiography;  Surgeon: Tonny Bollman, MD;  Location: [facility] INVASIVE CV LAB;  Service: Cardiovascular;  Laterality: N/A;   CARDIAC CATHETERIZATION N/A 06/29/2016   Procedure: Left Heart Cath and Coronary Angiography;  Surgeon: Yvonne Kendall, MD;  Location: [facility] INVASIVE CV LAB;  Service: Cardiovascular;  Laterality: N/A;   CARDIAC CATHETERIZATION N/A 06/29/2016   Procedure: Intravascular Pressure Wire/FFR Study;  Surgeon: Yvonne Kendall, MD;  Location: [facility] INVASIVE CV LAB;  Service: Cardiovascular;  Laterality: N/A;   CARDIAC CATHETERIZATION N/A 06/29/2016   Procedure: Coronary Balloon Angioplasty;  Surgeon: Yvonne Kendall, MD;  Location: [facility]  INVASIVE CV LAB;  Service: Cardiovascular;  Laterality: N/A;   COLON RESECTION  12/19/2011   Procedure: COLON RESECTION LAPAROSCOPIC;  Surgeon: Emelia Loron, MD;  Location: [facility] OR;  Service: General;  Laterality: N/A;  laparoscopic hand assisted partial colon resection   COLON SURGERY     COLONOSCOPY     CORONARY ANGIOPLASTY WITH STENT PLACEMENT  ~ 2007   1 stent   CORONARY ARTERY BYPASS GRAFT  2007   "CABG X2"   DILATION AND CURETTAGE OF UTERUS  1973   EP IMPLANTABLE DEVICE N/A 03/25/2015   MDT Advisa DR pacemaker implanted by Dr Johney Frame for transient  complete heart block and syncope   ESOPHAGOGASTRODUODENOSCOPY (EGD) WITH PROPOFOL N/A 03/14/2021   Procedure: ESOPHAGOGASTRODUODENOSCOPY (EGD) WITH PROPOFOL;  Surgeon: Kerin Salen, MD;  Location: [facility] ENDOSCOPY;  Service: Gastroenterology;  Laterality: N/A;   ESOPHAGOGASTRODUODENOSCOPY (EGD) WITH PROPOFOL N/A 11/13/2021   Procedure: ESOPHAGOGASTRODUODENOSCOPY (EGD) WITH PROPOFOL;  Surgeon: Kathi Der, MD;  Location: [facility] ENDOSCOPY;  Service: Gastroenterology;  Laterality: N/A;   HEMOSTASIS CLIP PLACEMENT  03/14/2021   Procedure: HEMOSTASIS CLIP PLACEMENT;  Surgeon: Kerin Salen, MD;  Location: [facility] ENDOSCOPY;  Service: Gastroenterology;;   INSERT / REPLACE / REMOVE PACEMAKER     LEFT HEART CATH AND CORONARY ANGIOGRAPHY N/A 11/11/2021   Procedure: LEFT HEART CATH AND CORONARY ANGIOGRAPHY;  Surgeon: Marykay Lex, MD;  Location: [facility] INVASIVE CV LAB;  Service: Cardiovascular;  Laterality: N/A;   LEFT HEART CATH AND CORS/GRAFTS ANGIOGRAPHY N/A 02/21/2018   Procedure: LEFT HEART CATH AND CORS/GRAFTS ANGIOGRAPHY;  Surgeon: Runell Gess, MD;  Location: [facility] CV LAB;  Service: Cardiovascular;  Laterality: N/A;   LEFT HEART CATH AND CORS/GRAFTS ANGIOGRAPHY N/A 03/08/2021   Procedure: LEFT HEART CATH AND CORS/GRAFTS ANGIOGRAPHY;  Surgeon: [place], Peter M, MD;  Location: [facility] INVASIVE CV LAB;  Service: Cardiovascular;  Laterality: N/A;   PORT-A-CATH REMOVAL N/A 06/12/2013   Procedure: REMOVAL PORT-A-CATH;  Surgeon: Emelia Loron, MD;  Location: [facility];  Service: General;  Laterality: N/A;   PORTACATH PLACEMENT  06/04/2012   Procedure: INSERTION PORT-A-CATH;  Surgeon: Emelia Loron, MD;  Location: [facility] OR;  Service: General;  Laterality: N/A;  Insertion of port-a-cath    POSTERIOR LAMINECTOMY / DECOMPRESSION CERVICAL SPINE  1990s   SCLEROTHERAPY  03/14/2021   Procedure: SCLEROTHERAPY;  Surgeon: Kerin Salen, MD;  Location: [facility] ENDOSCOPY;  Service: Gastroenterology;;   VAGINAL  HYSTERECTOMY      Current Meds  Medication Sig   acetaminophen (TYLENOL) 500 MG tablet Take 500-1,000 mg by mouth daily as needed for mild pain or headache.   aspirin 81 MG chewable tablet Chew 1 tablet (81 mg total) by mouth daily.   Azelastine HCl 0.15 % SOLN Place 1 spray into both nostrils daily as needed (seasonal allergies).   Bempedoic Acid-Ezetimibe (NEXLIZET) 180-10 MG TABS Take 180 mg by mouth daily.   calcium carbonate (TUMS - DOSED IN MG ELEMENTAL CALCIUM) 500 MG chewable tablet Chew 500 mg by mouth daily.   carbamazepine (TEGRETOL XR) 200 MG 12 hr tablet Take 3 tablets (600 mg total) by mouth 2 (two) times daily.   Cholecalciferol (VITAMIN D) 50 MCG (2000 UT) CAPS Take 4,000 Units by mouth daily.   diphenhydrAMINE (BENADRYL) 25 MG tablet Take 25 mg by mouth daily.   diphenhydramine-acetaminophen (TYLENOL PM) 25-500 MG TABS tablet Take 1 tablet by mouth at bedtime.   DULoxetine (CYMBALTA) 60 MG capsule Take 60 mg by mouth 2 (two) times daily.   ethosuximide (  ZARONTIN) 250 MG capsule TAKE 2 CAPSULES IN THE MORNING, TAKE 2 CAPSULES AT NIGHT   Evolocumab (REPATHA SURECLICK) 140 MG/ML SOAJ Inject 140 mg into the skin every 14 (fourteen) days.   ferrous sulfate 325 (65 FE) MG EC tablet Take 325 mg by mouth daily with breakfast.   fluticasone (FLONASE) 50 MCG/ACT nasal spray Place 1 spray into both nostrils daily as needed for allergies.   isosorbide mononitrate (IMDUR) 30 MG 24 hr tablet TAKE HALF TABLET (15MG  TOTAL) BY MOUTH EVERY DAY   levothyroxine (SYNTHROID, LEVOTHROID) 50 MCG tablet Take 50 mcg by mouth daily before breakfast.   memantine (NAMENDA) 10 MG tablet Take 1 tablet (10 mg total) by mouth 2 (two) times daily.   nitroGLYCERIN (NITROSTAT) 0.4 MG SL tablet place 1 tablet under the tongue every 5 minutes as needed for chest pain   Oral Electrolytes (THERMOTABS PO) Take 452 mg by mouth daily. Thermotabs 452 mg sodium chloride   pantoprazole (PROTONIX) 40 MG tablet TAKE ONE  TABLET BY MOUTH TWICE DAILY FOR 30 DAYS THEN TAKE ONE TABLET BY MOUTH DAILY   prasugrel (EFFIENT) 10 MG TABS tablet Take 1 tablet (10 mg total) by mouth daily.   [DISCONTINUED] sucralfate (CARAFATE) 1 g tablet Take 1 tablet (1 g total) by mouth with breakfast, with lunch, and with evening meal for 7 days.    Allergies  Allergen Reactions   Sulfamethoxazole Anaphylaxis    Don't recall   Aspirin Other (See Comments)    GI upset- can tolerate 81 mg ASA, just not full doses   Crestor [Rosuvastatin]     myalgias   Erythromycin Nausea Only    Gi upset   Lisinopril Cough   Niacin And Related Other (See Comments)    Don't recall   Penicillin G Benzathine     Other reaction(s): Unknown   Statins Other (See Comments)    Muscle pain   Sulfa Drugs Cross Reactors Swelling    Don't recall   Tetracyclines & Related Nausea Only   Xeloda [Capecitabine] Diarrhea   Penicillins Nausea Only and Rash        Plavix [Clopidogrel Bisulfate] Rash      Review of Systems negative except from HPI and PMH  Physical Exam BP 128/62   Pulse 84   Ht 5\' 8"  (1.727 m)   Wt 182 lb (82.6 kg)   SpO2 97%   BMI 27.67 kg/m  Well developed and well nourished in no acute distress HENT normal Neck supple with JVP-flat Clear Device pocket well healed; without hematoma or erythema.  There is no tethering  Regular rate and rhythm, no murmur Abd-soft with active BS No Clubbing cyanosis  edema Skin-warm and dry A & Oriented  Grossly normal sensory and motor function  ECG sinus at 84 's 19/09/39 PVCs-frequent left bundle inferior axis  Device function is normal. Programming changes none  See Paceart for details    Estimated Creatinine Clearance: 65.7 mL/min (by C-G formula based on SCr of 0.86 mg/dL).   Assessment and  Plan  Sick sinus syndrome   Pacemaker Medtronic   Orthostatic hypotension    PVCs-RVOT  Chest discomfort  Coronary artery disease with prior CABG and multiple  stents  Anemia with microcytosis  History of GI bleeds   Device function is normal.  Orthostatic hypotension may be aggravated by the anemia.  Recommend compression  Low blood pressure makes anti-ischemic therapy difficult, will add ranolazine 500 mg twice daily  With active chest pain, have  discussed with Dr. Garth Bigness.  She was seen just the other day with recurrent chest pain and negative troponins,   anticipate catheterization either or emergently or semiurgently depending on response to in office nitroglycerin Has responded to nitroglycerin.  Begin ranolazine 500 twice daily and arrange for catheterization next week  Microcytic anemia, GI evaluation is pending.  Will check her iron labs so they will be ready for when she is seen next week probably will need iron replacement  RVOT PVCs frequent and large in a pattern of bigeminy.  Without symptoms.  Will check an echocardiogram to see whether there is any impact on heart muscle function and if not hold off on therapy.  Otherwise would probably consider catheter ablation as a primary therapy given significant comorbidities   FeSat came bck 5% and Hgb stable 9.4   Current medicines are reviewed at length with the patient today .  The patient does not  have concerns regarding medicines.

## 2023-02-02 NOTE — Patient Instructions (Addendum)
Medication Instructions:  Your physician has recommended you make the following change in your medication:   ** Begin Ranolazine 500mg  - 1 tablet by mouth twice daily  *If you need a refill on your cardiac medications before your next appointment, please call your pharmacy*   Lab Work: CBC and BMET, Iron Studies If you have labs (blood work) drawn today and your tests are completely normal, you will receive your results only by: MyChart Message (if you have MyChart) OR A paper copy in the mail If you have any lab test that is abnormal or we need to change your treatment, we will call you to review the results.   Testing/Procedures: Heart Cath scheduled   Follow-Up: At [facility], you and your health needs are our priority.  As part of our continuing mission to provide you with exceptional heart care, we have created designated Provider Care Teams.  These Care Teams include your primary Cardiologist (physician) and Advanced Practice Providers (APPs -  Physician Assistants and Nurse Practitioners) who all work together to provide you with the care you need, when you need it.  We recommend signing up for the patient portal called "MyChart".  Sign up information is provided on this After Visit Summary.  MyChart is used to connect with patients for Virtual Visits (Telemedicine).  Patients are able to view lab/test results, encounter notes, upcoming appointments, etc.  Non-urgent messages can be sent to your provider as well.   To learn more about what you can do with MyChart, go to [email or web address].    Your next appointment:   To be scheduled  Other Instructions  [facility] A DEPT OF [place] AT [facility] [address] [place], [place] [address] [place] [address] Dept: [phone] Loc: [phone]  Rachel Clark  02/02/2023  You are scheduled for a Cardiac Catheterization on Wednesday, August 7 with Dr.  Verne Carrow.  1. Please arrive at the [facility] (Main Entrance A) at [facility]: [address] [place], [place] [address] at 6:30 AM (This time is 2 hour(s) before your procedure to ensure your preparation). Free valet parking service is available. You will check in at ADMITTING. The support person will be asked to wait in the waiting room.  It is OK to have someone drop you off and come back when you are ready to be discharged.    Special note: Every effort is made to have your procedure done on time. Please understand that emergencies sometimes delay scheduled procedures.  2. Diet: Do not eat solid foods after midnight.  The patient may have clear liquids until 5am upon the day of the procedure.  3. Labs: You will need to have blood drawn today  4. Medication instructions in preparation for your procedure:   Contrast Allergy: No   Current Outpatient Medications (Endocrine & Metabolic):    levothyroxine (SYNTHROID, LEVOTHROID) 50 MCG tablet, Take 50 mcg by mouth daily before breakfast.  Current Outpatient Medications (Cardiovascular):    Bempedoic Acid-Ezetimibe (NEXLIZET) 180-10 MG TABS, Take 180 mg by mouth daily.   Evolocumab (REPATHA SURECLICK) 140 MG/ML SOAJ, Inject 140 mg into the skin every 14 (fourteen) days.   isosorbide mononitrate (IMDUR) 30 MG 24 hr tablet, TAKE HALF TABLET (15MG  TOTAL) BY MOUTH EVERY DAY   nitroGLYCERIN (NITROSTAT) 0.4 MG SL tablet, place 1 tablet under the tongue every 5 minutes as needed for chest pain   ranolazine (RANEXA) 500 MG 12 hr  tablet, Take 1 tablet (500 mg total) by mouth 2 (two) times daily.  Current Outpatient Medications (Respiratory):    Azelastine HCl 0.15 % SOLN, Place 1 spray into both nostrils daily as needed (seasonal allergies).   diphenhydrAMINE (BENADRYL) 25 MG tablet, Take 25 mg by mouth daily.   fluticasone (FLONASE) 50 MCG/ACT nasal spray, Place 1 spray into both nostrils daily as needed for  allergies.  Current Outpatient Medications (Analgesics):    acetaminophen (TYLENOL) 500 MG tablet, Take 500-1,000 mg by mouth daily as needed for mild pain or headache.   aspirin 81 MG chewable tablet, Chew 1 tablet (81 mg total) by mouth daily.  Current Outpatient Medications (Hematological):    ferrous sulfate 325 (65 FE) MG EC tablet, Take 325 mg by mouth daily with breakfast.   prasugrel (EFFIENT) 10 MG TABS tablet, Take 1 tablet (10 mg total) by mouth daily.  Current Outpatient Medications (Other):    calcium carbonate (TUMS - DOSED IN MG ELEMENTAL CALCIUM) 500 MG chewable tablet, Chew 500 mg by mouth daily.   carbamazepine (TEGRETOL XR) 200 MG 12 hr tablet, Take 3 tablets (600 mg total) by mouth 2 (two) times daily.   Cholecalciferol (VITAMIN D) 50 MCG (2000 UT) CAPS, Take 4,000 Units by mouth daily.   diphenhydramine-acetaminophen (TYLENOL PM) 25-500 MG TABS tablet, Take 1 tablet by mouth at bedtime.   DULoxetine (CYMBALTA) 60 MG capsule, Take 60 mg by mouth 2 (two) times daily.   ethosuximide (ZARONTIN) 250 MG capsule, TAKE 2 CAPSULES IN THE MORNING, TAKE 2 CAPSULES AT NIGHT   memantine (NAMENDA) 10 MG tablet, Take 1 tablet (10 mg total) by mouth 2 (two) times daily.   Oral Electrolytes (THERMOTABS PO), Take 452 mg by mouth daily. Thermotabs 452 mg sodium chloride   pantoprazole (PROTONIX) 40 MG tablet, TAKE ONE TABLET BY MOUTH TWICE DAILY FOR 30 DAYS THEN TAKE ONE TABLET BY MOUTH DAILY *For reference purposes while preparing patient instructions.   Delete this med list prior to printing instructions for patient.*  Do not take any medications the morning of the procedure other than the medications listed below:  On the morning of your procedure, take your Aspirin 81 mg and Prasugrel  5. Plan to go home the same day, you will only stay overnight if medically necessary. 6. Bring a current list of your medications and current insurance cards. 7. You MUST have a responsible person to  drive you home. 8. Someone MUST be with you the first 24 hours after you arrive home or your discharge will be delayed. 9. Please wear clothes that are easy to get on and off and wear slip-on shoes.  Thank you for allowing [place] to care for you!   -- [facility]

## 2023-02-03 ENCOUNTER — Inpatient Hospital Stay (HOSPITAL_COMMUNITY)
Admission: EM | Admit: 2023-02-03 | Discharge: 2023-02-08 | DRG: 812 | Disposition: A | Discharge status: Home Health | Payer: Medicare Other | Attending: Cardiovascular Disease | Admitting: Cardiovascular Disease

## 2023-02-03 ENCOUNTER — Encounter (HOSPITAL_COMMUNITY): Payer: Self-pay | Admitting: Cardiology

## 2023-02-03 ENCOUNTER — Emergency Department (HOSPITAL_COMMUNITY): Payer: Medicare Other

## 2023-02-03 ENCOUNTER — Other Ambulatory Visit: Payer: Self-pay

## 2023-02-03 DIAGNOSIS — Z79899 Other long term (current) drug therapy: Secondary | ICD-10-CM

## 2023-02-03 DIAGNOSIS — K2970 Gastritis, unspecified, without bleeding: Secondary | ICD-10-CM | POA: Diagnosis present

## 2023-02-03 DIAGNOSIS — K3189 Other diseases of stomach and duodenum: Secondary | ICD-10-CM | POA: Diagnosis present

## 2023-02-03 DIAGNOSIS — Z8249 Family history of ischemic heart disease and other diseases of the circulatory system: Secondary | ICD-10-CM

## 2023-02-03 DIAGNOSIS — Z981 Arthrodesis status: Secondary | ICD-10-CM | POA: Diagnosis not present

## 2023-02-03 DIAGNOSIS — I442 Atrioventricular block, complete: Secondary | ICD-10-CM | POA: Diagnosis present

## 2023-02-03 DIAGNOSIS — I491 Atrial premature depolarization: Secondary | ICD-10-CM | POA: Diagnosis not present

## 2023-02-03 DIAGNOSIS — I2510 Atherosclerotic heart disease of native coronary artery without angina pectoris: Secondary | ICD-10-CM | POA: Diagnosis present

## 2023-02-03 DIAGNOSIS — M797 Fibromyalgia: Secondary | ICD-10-CM | POA: Diagnosis present

## 2023-02-03 DIAGNOSIS — D500 Iron deficiency anemia secondary to blood loss (chronic): Secondary | ICD-10-CM | POA: Diagnosis not present

## 2023-02-03 DIAGNOSIS — G40409 Other generalized epilepsy and epileptic syndromes, not intractable, without status epilepticus: Secondary | ICD-10-CM | POA: Diagnosis present

## 2023-02-03 DIAGNOSIS — Z888 Allergy status to other drugs, medicaments and biological substances status: Secondary | ICD-10-CM

## 2023-02-03 DIAGNOSIS — Z7902 Long term (current) use of antithrombotics/antiplatelets: Secondary | ICD-10-CM | POA: Diagnosis not present

## 2023-02-03 DIAGNOSIS — E039 Hypothyroidism, unspecified: Secondary | ICD-10-CM | POA: Diagnosis present

## 2023-02-03 DIAGNOSIS — Z8679 Personal history of other diseases of the circulatory system: Secondary | ICD-10-CM | POA: Diagnosis not present

## 2023-02-03 DIAGNOSIS — Z951 Presence of aortocoronary bypass graft: Secondary | ICD-10-CM | POA: Diagnosis not present

## 2023-02-03 DIAGNOSIS — Z955 Presence of coronary angioplasty implant and graft: Secondary | ICD-10-CM

## 2023-02-03 DIAGNOSIS — Z79890 Hormone replacement therapy (postmenopausal): Secondary | ICD-10-CM | POA: Diagnosis not present

## 2023-02-03 DIAGNOSIS — Z950 Presence of cardiac pacemaker: Secondary | ICD-10-CM

## 2023-02-03 DIAGNOSIS — K259 Gastric ulcer, unspecified as acute or chronic, without hemorrhage or perforation: Secondary | ICD-10-CM | POA: Diagnosis present

## 2023-02-03 DIAGNOSIS — K209 Esophagitis, unspecified without bleeding: Secondary | ICD-10-CM | POA: Diagnosis not present

## 2023-02-03 DIAGNOSIS — D509 Iron deficiency anemia, unspecified: Secondary | ICD-10-CM | POA: Diagnosis present

## 2023-02-03 DIAGNOSIS — Z886 Allergy status to analgesic agent status: Secondary | ICD-10-CM

## 2023-02-03 DIAGNOSIS — I495 Sick sinus syndrome: Secondary | ICD-10-CM | POA: Diagnosis present

## 2023-02-03 DIAGNOSIS — D62 Acute posthemorrhagic anemia: Secondary | ICD-10-CM | POA: Diagnosis present

## 2023-02-03 DIAGNOSIS — F32A Depression, unspecified: Secondary | ICD-10-CM | POA: Diagnosis present

## 2023-02-03 DIAGNOSIS — D649 Anemia, unspecified: Secondary | ICD-10-CM | POA: Diagnosis not present

## 2023-02-03 DIAGNOSIS — Z9049 Acquired absence of other specified parts of digestive tract: Secondary | ICD-10-CM

## 2023-02-03 DIAGNOSIS — R008 Other abnormalities of heart beat: Secondary | ICD-10-CM | POA: Diagnosis not present

## 2023-02-03 DIAGNOSIS — Z87891 Personal history of nicotine dependence: Secondary | ICD-10-CM | POA: Diagnosis not present

## 2023-02-03 DIAGNOSIS — Z7982 Long term (current) use of aspirin: Secondary | ICD-10-CM

## 2023-02-03 DIAGNOSIS — I10 Essential (primary) hypertension: Secondary | ICD-10-CM | POA: Diagnosis present

## 2023-02-03 DIAGNOSIS — I25709 Atherosclerosis of coronary artery bypass graft(s), unspecified, with unspecified angina pectoris: Secondary | ICD-10-CM | POA: Diagnosis not present

## 2023-02-03 DIAGNOSIS — R079 Chest pain, unspecified: Secondary | ICD-10-CM | POA: Diagnosis present

## 2023-02-03 DIAGNOSIS — Z85038 Personal history of other malignant neoplasm of large intestine: Secondary | ICD-10-CM

## 2023-02-03 DIAGNOSIS — Z881 Allergy status to other antibiotic agents status: Secondary | ICD-10-CM

## 2023-02-03 DIAGNOSIS — Z880 Allergy status to penicillin: Secondary | ICD-10-CM

## 2023-02-03 DIAGNOSIS — R413 Other amnesia: Secondary | ICD-10-CM | POA: Diagnosis present

## 2023-02-03 DIAGNOSIS — Z90710 Acquired absence of both cervix and uterus: Secondary | ICD-10-CM

## 2023-02-03 DIAGNOSIS — I252 Old myocardial infarction: Secondary | ICD-10-CM

## 2023-02-03 DIAGNOSIS — E785 Hyperlipidemia, unspecified: Secondary | ICD-10-CM | POA: Diagnosis present

## 2023-02-03 DIAGNOSIS — M545 Low back pain, unspecified: Secondary | ICD-10-CM | POA: Diagnosis present

## 2023-02-03 DIAGNOSIS — G8929 Other chronic pain: Secondary | ICD-10-CM | POA: Diagnosis present

## 2023-02-03 DIAGNOSIS — Z882 Allergy status to sulfonamides status: Secondary | ICD-10-CM

## 2023-02-03 DIAGNOSIS — I951 Orthostatic hypotension: Secondary | ICD-10-CM | POA: Diagnosis present

## 2023-02-03 DIAGNOSIS — R0789 Other chest pain: Secondary | ICD-10-CM | POA: Diagnosis not present

## 2023-02-03 LAB — BASIC METABOLIC PANEL
Anion gap: 9 (ref 5–15)
BUN: 18 mg/dL (ref 8–23)
CO2: 22 mmol/L (ref 22–32)
Calcium: 8.8 mg/dL — ABNORMAL LOW (ref 8.9–10.3)
Chloride: 105 mmol/L (ref 98–111)
Creatinine, Ser: 0.73 mg/dL (ref 0.44–1.00)
GFR, Estimated: >60 mL/min (ref >60)
Glucose, Bld: 119 mg/dL — ABNORMAL HIGH (ref 70–99)
Potassium: 3.8 mmol/L (ref 3.5–5.1)
Sodium: 136 mmol/L (ref 135–145)

## 2023-02-03 LAB — CREATININE, SERUM
Creatinine, Ser: 0.81 mg/dL (ref 0.44–1.00)
GFR, Estimated: >60 mL/min (ref >60)

## 2023-02-03 LAB — CBC
HCT: 28.5 % — ABNORMAL LOW (ref 36.0–46.0)
HCT: 27.6 % — ABNORMAL LOW (ref 36.0–46.0)
Hemoglobin: 8.7 g/dL — ABNORMAL LOW (ref 12.0–15.0)
Hemoglobin: 8.4 g/dL — ABNORMAL LOW (ref 12.0–15.0)
MCH: 23.1 pg — ABNORMAL LOW (ref 26.0–34.0)
MCH: 22.9 pg — ABNORMAL LOW (ref 26.0–34.0)
MCHC: 30.5 g/dL (ref 30.0–36.0)
MCHC: 30.4 g/dL (ref 30.0–36.0)
MCV: 75.8 fL — ABNORMAL LOW (ref 80.0–100.0)
MCV: 75.2 fL — ABNORMAL LOW (ref 80.0–100.0)
Platelets: 356 10*3/uL (ref 150–400)
Platelets: 352 10*3/uL (ref 150–400)
RBC: 3.76 MIL/uL — ABNORMAL LOW (ref 3.87–5.11)
RBC: 3.67 MIL/uL — ABNORMAL LOW (ref 3.87–5.11)
RDW: 16.4 % — ABNORMAL HIGH (ref 11.5–15.5)
RDW: 16.5 % — ABNORMAL HIGH (ref 11.5–15.5)
WBC: 7 10*3/uL (ref 4.0–10.5)
WBC: 6.5 10*3/uL (ref 4.0–10.5)
nRBC: 0 % (ref 0.0–0.2)
nRBC: 0 % (ref 0.0–0.2)

## 2023-02-03 LAB — TROPONIN I (HIGH SENSITIVITY)
Troponin I (High Sensitivity): 9 ng/L (ref <18)
Troponin I (High Sensitivity): 9 ng/L (ref <18)

## 2023-02-03 MED ORDER — ASPIRIN 81 MG PO CHEW: 81.0000 mg | CHEWABLE_TABLET | Freq: Every day | ORAL | Status: DC

## 2023-02-03 MED ORDER — ACETAMINOPHEN 500 MG PO TABS
500.0000 mg | ORAL_TABLET | Freq: Every day | ORAL | Status: DC | PRN
  Administered 2023-02-03: 1000 mg via ORAL
  Filled 2023-02-03: qty 2

## 2023-02-03 MED ORDER — MEMANTINE HCL 10 MG PO TABS
10.0000 mg | ORAL_TABLET | Freq: Two times a day (BID) | ORAL | Status: DC
  Administered 2023-02-03 – 2023-02-08 (×10): 10 mg via ORAL
  Filled 2023-02-03 (×10): qty 1

## 2023-02-03 MED ORDER — RANOLAZINE ER 500 MG PO TB12: 500.0000 mg | ORAL_TABLET | Freq: Two times a day (BID) | ORAL | Status: DC

## 2023-02-03 MED ORDER — ETHOSUXIMIDE 250 MG PO CAPS
500.0000 mg | ORAL_CAPSULE | Freq: Two times a day (BID) | ORAL | Status: DC
  Administered 2023-02-03 – 2023-02-08 (×10): 500 mg via ORAL
  Filled 2023-02-03 (×11): qty 2

## 2023-02-03 MED ORDER — DULOXETINE HCL 60 MG PO CPEP
60.0000 mg | ORAL_CAPSULE | Freq: Two times a day (BID) | ORAL | Status: DC
  Administered 2023-02-03 – 2023-02-08 (×10): 60 mg via ORAL
  Filled 2023-02-03 (×10): qty 1

## 2023-02-03 MED ORDER — ISOSORBIDE MONONITRATE ER 30 MG PO TB24: 30.0000 mg | ORAL_TABLET | Freq: Every day | ORAL | Status: DC

## 2023-02-03 MED ORDER — CALCIUM CARBONATE ANTACID 500 MG PO CHEW: 500.0000 mg | CHEWABLE_TABLET | Freq: Every day | ORAL | Status: DC

## 2023-02-03 MED ORDER — FERROUS SULFATE 325 (65 FE) MG PO TABS
325.0000 mg | ORAL_TABLET | Freq: Every day | ORAL | Status: DC
  Administered 2023-02-04 – 2023-02-08 (×5): 325 mg via ORAL
  Filled 2023-02-03 (×5): qty 1

## 2023-02-03 MED ORDER — ISOSORBIDE MONONITRATE ER 30 MG PO TB24
30.0000 mg | ORAL_TABLET | Freq: Every day | ORAL | Status: DC
  Administered 2023-02-04 – 2023-02-08 (×5): 30 mg via ORAL
  Filled 2023-02-03 (×5): qty 1

## 2023-02-03 MED ORDER — CARBAMAZEPINE ER 200 MG PO TB12
600.0000 mg | ORAL_TABLET | Freq: Two times a day (BID) | ORAL | Status: DC
  Administered 2023-02-03 – 2023-02-08 (×10): 600 mg via ORAL
  Filled 2023-02-03 (×11): qty 3

## 2023-02-03 MED ORDER — PRASUGREL HCL 10 MG PO TABS
10.0000 mg | ORAL_TABLET | Freq: Every day | ORAL | Status: DC
  Administered 2023-02-04 – 2023-02-08 (×5): 10 mg via ORAL
  Filled 2023-02-03 (×5): qty 1

## 2023-02-03 MED ORDER — VITAMIN D 25 MCG (1000 UNIT) PO TABS
4000.0000 [IU] | ORAL_TABLET | Freq: Every day | ORAL | Status: DC
  Administered 2023-02-04 – 2023-02-08 (×5): 4000 [IU] via ORAL
  Filled 2023-02-03 (×5): qty 4

## 2023-02-03 MED ORDER — PANTOPRAZOLE SODIUM 40 MG PO TBEC
40.0000 mg | DELAYED_RELEASE_TABLET | Freq: Every day | ORAL | Status: DC
  Administered 2023-02-04 – 2023-02-05 (×2): 40 mg via ORAL
  Filled 2023-02-03 (×2): qty 1

## 2023-02-03 MED ORDER — HEPARIN SODIUM (PORCINE) 5000 UNIT/ML IJ SOLN
5000.0000 [IU] | Freq: Three times a day (TID) | INTRAMUSCULAR | Status: DC
  Administered 2023-02-03 – 2023-02-08 (×12): 5000 [IU] via SUBCUTANEOUS
  Filled 2023-02-03 (×12): qty 1

## 2023-02-03 MED ORDER — LEVOTHYROXINE SODIUM 50 MCG PO TABS
50.0000 ug | ORAL_TABLET | Freq: Every day | ORAL | Status: DC
  Administered 2023-02-04 – 2023-02-08 (×5): 50 ug via ORAL
  Filled 2023-02-03 (×5): qty 1

## 2023-02-03 MED ORDER — BEMPEDOIC ACID-EZETIMIBE 180-10 MG PO TABS
ORAL_TABLET | Freq: Every day | ORAL | Status: DC
  Administered 2023-02-04 – 2023-02-08 (×5): 1 via ORAL
  Filled 2023-02-03 (×4): qty 1

## 2023-02-03 MED ORDER — PANTOPRAZOLE SODIUM 40 MG PO TBEC: 40.0000 mg | DELAYED_RELEASE_TABLET | Freq: Every day | ORAL | Status: DC

## 2023-02-03 MED ORDER — CALCIUM CARBONATE ANTACID 500 MG PO CHEW
500.0000 mg | CHEWABLE_TABLET | Freq: Every day | ORAL | Status: DC
  Administered 2023-02-04 – 2023-02-08 (×5): 500 mg via ORAL
  Filled 2023-02-03 (×5): qty 3

## 2023-02-03 MED ORDER — FLUTICASONE PROPIONATE 50 MCG/ACT NA SUSP: 1.0000 | Freq: Every day | NASAL | Status: DC | PRN

## 2023-02-03 MED ORDER — ASPIRIN 81 MG PO CHEW
81.0000 mg | CHEWABLE_TABLET | Freq: Every day | ORAL | Status: DC
  Administered 2023-02-04 – 2023-02-08 (×4): 81 mg via ORAL
  Filled 2023-02-03 (×4): qty 1

## 2023-02-03 MED ORDER — RANOLAZINE ER 500 MG PO TB12
1000.0000 mg | ORAL_TABLET | Freq: Two times a day (BID) | ORAL | Status: DC
  Administered 2023-02-03 – 2023-02-06 (×7): 1000 mg via ORAL
  Filled 2023-02-03 (×7): qty 2

## 2023-02-03 NOTE — ED Triage Notes (Signed)
[name] from home for chest pain starting 1700 7/10. Patient administered x2 nitroglycerin from her medication with relief, however the patient's cardiologist recommended her to get seen d/t cath scheduled for Thursday. VSS stable, patient A&Ox4, ambulatory with EMS.

## 2023-02-03 NOTE — ED Notes (Signed)
ED TO INPATIENT HANDOFF REPORT  ED Nurse Name and Phone #: Donny Pique, RN [phone]  S Name/Age/Gender Rachel Clark 72 y.o. female Room/Bed: 011C/011C  Code Status   Code Status: Full Code  Home/SNF/Other Home Patient oriented to: self, place, time, and situation Is this baseline? Yes   Triage Complete: Triage complete  Chief Complaint Chest pain [R07.9]  Triage Note [name] from home for chest pain starting 1700 7/10. Patient administered x2 nitroglycerin from her medication with relief, however the patient's cardiologist recommended her to get seen d/t cath scheduled for Thursday. VSS stable, patient A&Ox4, ambulatory with EMS.   Allergies Allergies  Allergen Reactions   Sulfamethoxazole Anaphylaxis    Don't recall   Aspirin Other (See Comments)    GI upset- can tolerate 81 mg ASA, just not full doses   Crestor [Rosuvastatin]     myalgias   Erythromycin Nausea Only    Gi upset   Lisinopril Cough   Niacin And Related Other (See Comments)    Don't recall   Penicillin G Benzathine     Other reaction(s): Unknown   Statins Other (See Comments)    Muscle pain   Sulfa Drugs Cross Reactors Swelling    Don't recall   Tetracyclines & Related Nausea Only   Xeloda [Capecitabine] Diarrhea   Penicillins Nausea Only and Rash        Plavix [Clopidogrel Bisulfate] Rash    Level of Care/Admitting Diagnosis ED Disposition     ED Disposition  Admit   Condition  --   Comment  Hospital Area: [facility] [[id number]]  Level of Care: Telemetry Cardiac [103]  May admit patient to [facility] or [facility] if equivalent level of care is available:: No  Covid Evaluation: Asymptomatic - no recent exposure (last 10 days) testing not required  Diagnosis: Chest pain [528413]  Admitting Physician: Elmon Kirschner [[id number]]  Attending Physician: Tessie Fass  Certification:: I certify this patient will need inpatient services for at least 2  midnights  Estimated Length of Stay: 3          B Medical/Surgery History Past Medical History:  Diagnosis Date   Arthritis    "fingers" (06/29/2016)   Chronic lower back pain    Colon cancer (HCC) 12/04/2011   s/p Laparoscopic-assisted transverse colectomy on 12/19/2011 by Dr. Dwain Sarna.  pT3 N0 M0.    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   last seizure was in 1972 ;takes Tegretol (06/29/2016)    Headache    "weekly" (06/29/2016)   Hyperlipidemia    Hyponatremia    Hypothyroid    Iron deficiency anemia 11/17/2011   Memory change 11/13/2016   Monoclonal gammopathy of unknown significance    Orthostatic hypotension    Presence of permanent cardiac pacemaker    Syncope and collapse    pacemaker implanted   Past Surgical History:  Procedure Laterality Date   ANTERIOR CERVICAL DECOMP/DISCECTOMY FUSION  1980's   BACK SURGERY     CARDIAC CATHETERIZATION  05/20/2009   obstructive native vessel disease in LAD, RCA, and first diagonal, patent vein graft to distal RCA and LIMA to LAD,normal. ef 60%   CARDIAC CATHETERIZATION N/A 03/24/2015   Procedure: Left Heart Cath and Cors/Grafts  Angiography;  Surgeon: Iran Ouch, MD;  Location: [facility] IN[facility] LAB;  Service: Cardiovascular;  Laterality: N/A;   CARDIAC CATHETERIZATION N/A 07/28/2015   Procedure: Left Heart Cath and Coronary Angiography;  Surgeon: Lennette Bihari, MD;  Location: [facility] LAB;  Service: Cardiovascular;  Laterality: N/A;   CARDIAC CATHETERIZATION N/A 11/09/2015   Procedure: Coronary Stent Intervention;  Surgeon: Tonny Bollman, MD;  Location: [facility] INVASIVE CV LAB;  Service: Cardiovascular;  Laterality: N/A;   CARDIAC CATHETERIZATION N/A 11/09/2015   Procedure: Left Heart Cath and Coronary Angiography;  Surgeon: Tonny Bollman, MD;  Location: [facility] INVASIVE CV LAB;  Service: Cardiovascular;  Laterality: N/A;   CARDIAC CATHETERIZATION N/A 06/29/2016   Procedure: Left Heart Cath and Coronary Angiography;  Surgeon: Yvonne Kendall, MD;  Location: [facility] INVASIVE CV LAB;  Service: Cardiovascular;  Laterality: N/A;   CARDIAC CATHETERIZATION N/A 06/29/2016   Procedure: Intravascular Pressure Wire/FFR Study;  Surgeon: Yvonne Kendall, MD;  Location: [facility] INVASIVE CV LAB;  Service: Cardiovascular;  Laterality: N/A;   CARDIAC CATHETERIZATION N/A 06/29/2016   Procedure: Coronary Balloon Angioplasty;  Surgeon: Yvonne Kendall, MD;  Location: [facility];  Service: Cardiovascular;  Laterality: N/A;   COLON RESECTION  12/19/2011   Procedure: COLON RESECTION LAPAROSCOPIC;  Surgeon: Emelia Loron, MD;  Location: [facility] OR;  Service: General;  Laterality: N/A;  laparoscopic hand assisted partial colon resection   COLON SURGERY     COLONOSCOPY     CORONARY ANGIOPLASTY WITH STENT PLACEMENT  ~ 2007   1 stent   CORONARY ARTERY BYPASS GRAFT  2007   "CABG X2"   DILATION AND CURETTAGE OF UTERUS  1973   EP IMPLANTABLE DEVICE N/A 03/25/2015   MDT Advisa DR pacemaker implanted by Dr Johney Frame for transient complete heart block and syncope   ESOPHAGOGASTRODUODENOSCOPY (EGD) WITH PROPOFOL N/A 03/14/2021   Procedure: ESOPHAGOGASTRODUODENOSCOPY (EGD) WITH PROPOFOL;  Surgeon: Kerin Salen, MD;  Location: [facility] ENDOSCOPY;  Service: Gastroenterology;  Laterality: N/A;   ESOPHAGOGASTRODUODENOSCOPY (EGD) WITH PROPOFOL N/A 11/13/2021   Procedure: ESOPHAGOGASTRODUODENOSCOPY (EGD) WITH PROPOFOL;  Surgeon: Kathi Der, MD;  Location: [facility] ENDOSCOPY;  Service: Gastroenterology;  Laterality: N/A;   HEMOSTASIS CLIP PLACEMENT  03/14/2021   Procedure: HEMOSTASIS CLIP PLACEMENT;  Surgeon: Kerin Salen, MD;  Location: [facility] ENDOSCOPY;  Service: Gastroenterology;;   INSERT / REPLACE / REMOVE PACEMAKER     LEFT HEART CATH AND CORONARY ANGIOGRAPHY N/A 11/11/2021    Procedure: LEFT HEART CATH AND CORONARY ANGIOGRAPHY;  Surgeon: Marykay Lex, MD;  Location: [facility] INVASIVE CV LAB;  Service: Cardiovascular;  Laterality: N/A;   LEFT HEART CATH AND CORS/GRAFTS ANGIOGRAPHY N/A 02/21/2018   Procedure: LEFT HEART CATH AND CORS/GRAFTS ANGIOGRAPHY;  Surgeon: Runell Gess, MD;  Location: [facility];  Service: Cardiovascular;  Laterality: N/A;   LEFT HEART CATH AND CORS/GRAFTS ANGIOGRAPHY N/A 03/08/2021   Procedure: LEFT HEART CATH AND CORS/GRAFTS ANGIOGRAPHY;  Surgeon: Swaziland, Peter M, MD;  Location: [facility] INVASIVE CV LAB;  Service: Cardiovascular;  Laterality: N/A;   PORT-A-CATH REMOVAL N/A 06/12/2013   Procedure: REMOVAL PORT-A-CATH;  Surgeon: Emelia Loron, MD;  Location: [facility];  Service: General;  Laterality: N/A;   PORTACATH PLACEMENT  06/04/2012   Procedure: INSERTION PORT-A-CATH;  Surgeon: Emelia Loron, MD;  Location: [facility] OR;  Service: General;  Laterality: N/A;  Insertion of port-a-cath    POSTERIOR LAMINECTOMY / DECOMPRESSION CERVICAL SPINE  1990s   SCLEROTHERAPY  03/14/2021   Procedure: SCLEROTHERAPY;  Surgeon: Kerin Salen, MD;  Location: [facility] ENDOSCOPY;  Service: Gastroenterology;;   VAGINAL HYSTERECTOMY       A IV Location/Drains/Wounds Patient Lines/Drains/Airways Status     Active Line/Drains/Airways     Name Placement date Placement time Site Days   Peripheral IV 02/03/23 20 G Anterior;Left Forearm 02/03/23  1904  Forearm  less than 1            Intake/Output Last 24 hours No intake or output data in the 24 hours ending 02/03/23 2017  Labs/Imaging Results for orders placed or performed during the hospital encounter of 02/03/23 (from the past 48 hour(s))  Basic metabolic panel     Status: Abnormal   Collection Time: 02/03/23  7:01 PM  Result Value Ref Range   Sodium 136 135 - 145 mmol/L   Potassium 3.8 3.5 - 5.1 mmol/L   Chloride 105 98 - 111 mmol/L   CO2 22 22 - 32 mmol/L   Glucose, Bld 119 (H) 70 -  99 mg/dL    Comment: Glucose reference range applies only to samples taken after fasting for at least 8 hours.   BUN 18 8 - 23 mg/dL   Creatinine, Ser 1.61 0.44 - 1.00 mg/dL   Calcium 8.8 (L) 8.9 - 10.3 mg/dL   GFR, Estimated >09 >60 mL/min    Comment: (NOTE) Calculated using the CKD-EPI Creatinine Equation (2021)    Anion gap 9 5 - 15    Comment: Performed at [facility], [address] [place], [place] [address]  CBC     Status: Abnormal   Collection Time: 02/03/23  7:01 PM  Result Value Ref Range   WBC 7.0 4.0 - 10.5 K/uL   RBC 3.76 (L) 3.87 - 5.11 MIL/uL   Hemoglobin 8.7 (L) 12.0 - 15.0 g/dL    Comment: Reticulocyte Hemoglobin testing may be clinically indicated, consider ordering this additional test [id number]    HCT 28.5 (L) 36.0 - 46.0 %   MCV 75.8 (L) 80.0 - 100.0 fL   MCH 23.1 (L) 26.0 - 34.0 pg   MCHC 30.5 30.0 - 36.0 g/dL   RDW 29.5 (H) 62.1 - 30.8 %   Platelets 356 150 - 400 K/uL   nRBC 0.0 0.0 - 0.2 %    Comment: Performed at [facility], [address], [place], [place] [address]  Troponin I (High Sensitivity)     Status: None   Collection Time: 02/03/23  7:01 PM  Result Value Ref Range   Troponin I (High Sensitivity) 9 <18 ng/L    Comment: (NOTE) Elevated high sensitivity troponin I (hsTnI) values and significant  changes across serial measurements may suggest ACS but many other  chronic and acute conditions are known to elevate hsTnI results.  Refer to the "Links" section for chest pain algorithms and additional  guidance. Performed at [facility], [address], [place], [place] [address]    DG Chest Portable 1 View  Result Date: 02/03/2023 CLINICAL DATA:  Chest pain EXAM: PORTABLE CHEST 1 VIEW COMPARISON:  01/28/2023 FINDINGS: Prior CABG. Left pacer remains in place, unchanged. Heart and mediastinal contours within normal limits. No confluent airspace opacities or effusions. No acute bony abnormality. IMPRESSION: No active disease.  Electronically Signed   By: Charlett Nose M.D.   On: 02/03/2023 18:50    Pending Labs [employer] (From admission, onward)     Start     Ordered   Signed and Held  CBC  (heparin)  Once,  R       Comments: Baseline for heparin therapy IF NOT ALREADY DRAWN.  Notify MD if PLT < 100 K.    Signed and Held   Signed and Held  Creatinine, serum  (heparin)  Once,   R       Comments: Baseline for heparin therapy IF NOT ALREADY DRAWN.    Signed and Held   Signed and Held  Basic metabolic panel  Tomorrow morning,   R        Signed and Held   Signed and Held  CBC  Tomorrow morning,   R        Signed and Held            Vitals/Pain Today's Vitals   02/03/23 1823 02/03/23 1902  BP:  (!) 151/68  Pulse:  80  Resp:  17  SpO2: 99% 99%  Weight:  82.6 kg  Height:  5\' 8"  (1.727 m)  PainSc:  2     Isolation Precautions No active isolations  Medications Medications - No data to display  Mobility walks     Focused Assessments    R Recommendations: See Admitting Provider Note  Report given to:   Additional Notes: Patient is aA&Ox4, calm, 2/10 pain at this time but trops are normal and she is comfortable. Cath is planned for Wednesday but cards wanted her to come in if she had to take nitroglycerin.

## 2023-02-03 NOTE — ED Provider Notes (Signed)
[place] EMERGENCY DEPARTMENT AT [facility] Provider Note   CSN: [id number] Arrival date & time: 02/03/23  1820     History  Chief Complaint  Patient presents with   Chest Pain    Rachel Clark is a 72 y.o. female.  72 year old female to the emergency department chief complaint chest pain.  Has significant cardiac history and is planned to have left heart catheterization on 8/7.  She began having some chest discomfort today while sitting.  Central chest pressure associated with some mild shortness of breath.  Symptoms seemingly improved with some aspirin and nitro.  She called on-call cardiologist to recommended her coming to the emergency department.   Chest Pain      Home Medications Prior to Admission medications   Medication Sig Start Date End Date Taking? Authorizing Provider  acetaminophen (TYLENOL) 500 MG tablet Take 500-1,000 mg by mouth daily as needed for mild pain or headache.    [provider]  aspirin 81 MG chewable tablet Chew 1 tablet (81 mg total) by mouth daily. 06/30/16   Little Ishikawa, NP  Azelastine HCl 0.15 % SOLN Place 1 spray into both nostrils daily as needed (seasonal allergies). 12/22/14   [provider]  Bempedoic Acid-Ezetimibe (NEXLIZET) 180-10 MG TABS Take 180 mg by mouth daily. 04/25/22   [place], Peter M, MD  calcium carbonate (TUMS - DOSED IN MG ELEMENTAL CALCIUM) 500 MG chewable tablet Chew 500 mg by mouth daily.    [provider]  carbamazepine (TEGRETOL XR) 200 MG 12 hr tablet Take 3 tablets (600 mg total) by mouth 2 (two) times daily. 09/19/22 12/13/23  Glean Salvo, NP  Cholecalciferol (VITAMIN D) 50 MCG (2000 UT) CAPS Take 4,000 Units by mouth daily.    [provider]  diphenhydrAMINE (BENADRYL) 25 MG tablet Take 25 mg by mouth daily as needed for allergies.    [provider]  diphenhydramine-acetaminophen (TYLENOL PM) 25-500 MG TABS tablet Take 1 tablet by mouth at bedtime as  needed (Sleep).    [provider]  DULoxetine (CYMBALTA) 60 MG capsule Take 60 mg by mouth 2 (two) times daily.    [provider]  ethosuximide (ZARONTIN) 250 MG capsule TAKE 2 CAPSULES IN THE MORNING, TAKE 2 CAPSULES AT NIGHT 07/11/22   Glean Salvo, NP  Evolocumab (REPATHA SURECLICK) 140 MG/ML SOAJ Inject 140 mg into the skin every 14 (fourteen) days. 12/25/22   [place], Peter M, MD  ferrous sulfate 325 (65 FE) MG EC tablet Take 325 mg by mouth daily with breakfast.    [provider]  fluticasone (FLONASE) 50 MCG/ACT nasal spray Place 1 spray into both nostrils daily as needed for allergies.    [provider]  isosorbide mononitrate (IMDUR) 30 MG 24 hr tablet TAKE HALF TABLET (15MG  TOTAL) BY MOUTH EVERY DAY 04/20/22   [place], Peter M, MD  levothyroxine (SYNTHROID, LEVOTHROID) 50 MCG tablet Take 50 mcg by mouth daily before breakfast.    [provider]  memantine (NAMENDA) 10 MG tablet Take 1 tablet (10 mg total) by mouth 2 (two) times daily. 09/14/22   Glean Salvo, NP  nitroGLYCERIN (NITROSTAT) 0.4 MG SL tablet place 1 tablet under the tongue every 5 minutes as needed for chest pain 01/24/23   [place], Peter M, MD  Oral Electrolytes (THERMOTABS PO) Take 452 mg by mouth daily. Thermotabs 452 mg sodium chloride    [provider]  pantoprazole (PROTONIX) 40 MG tablet TAKE ONE  TABLET BY MOUTH TWICE DAILY FOR 30 DAYS THEN TAKE ONE TABLET BY MOUTH DAILY Patient taking differently: Take 40 mg by mouth daily. 02/08/22   [place], Peter M, MD  prasugrel (EFFIENT) 10 MG TABS tablet Take 1 tablet (10 mg total) by mouth daily. 04/25/22   [place], Peter M, MD  ranolazine (RANEXA) 500 MG 12 hr tablet Take 1 tablet (500 mg total) by mouth 2 (two) times daily. 02/02/23   Duke Salvia, MD      Allergies    Sulfamethoxazole, Aspirin, Crestor [rosuvastatin], Erythromycin, Lisinopril, Niacin and related, Penicillin g benzathine, Statins, Sulfa drugs cross  reactors, Tetracyclines & related, Xeloda [capecitabine], Penicillins, and Plavix [clopidogrel bisulfate]    Review of Systems   Review of Systems  Cardiovascular:  Positive for chest pain.    Physical Exam Updated Vital Signs BP (!) 151/68 (BP Location: Right Arm)   Pulse 80   Resp 17   Ht 5\' 8"  (1.727 m)   Wt 82.6 kg   SpO2 99%   BMI 27.67 kg/m  Physical Exam  ED Results / Procedures / Treatments   Labs (all labs ordered are listed, but only abnormal results are displayed) Labs Reviewed  BASIC METABOLIC PANEL - Abnormal; Notable for the following components:      Result Value   Glucose, Bld 119 (*)    Calcium 8.8 (*)    All other components within normal limits  CBC - Abnormal; Notable for the following components:   RBC 3.76 (*)    Hemoglobin 8.7 (*)    HCT 28.5 (*)    MCV 75.8 (*)    MCH 23.1 (*)    RDW 16.4 (*)    All other components within normal limits  TROPONIN I (HIGH SENSITIVITY)  TROPONIN I (HIGH SENSITIVITY)    EKG None  Radiology DG Chest Portable 1 View  Result Date: 02/03/2023 CLINICAL DATA:  Chest pain EXAM: PORTABLE CHEST 1 VIEW COMPARISON:  01/28/2023 FINDINGS: Prior CABG. Left pacer remains in place, unchanged. Heart and mediastinal contours within normal limits. No confluent airspace opacities or effusions. No acute bony abnormality. IMPRESSION: No active disease. Electronically Signed   By: Charlett Nose M.D.   On: 02/03/2023 18:50    Procedures Procedures    Medications Ordered in ED Medications - No data to display  ED Course/ Medical Decision Making/ A&P Clinical Course as of 02/03/23 2100  Sat Feb 03, 2023  1922 CBC(!) Stable anemia.  No leukocytosis. [TY]  1947 Troponin I (High Sensitivity): 9 [TY]  1959 EKG and troponin reassuring.  Has had ongoing chest pain, has a Scheduled in several days for the [facility].  Will reach out to cardiology regarding possible admission [TY]  2004 Poke with cardiology fellow.  They will admit patient  [TY]    Clinical Course User Index [TY] Coral Spikes, DO                                 Medical Decision Making 72 year old female present emergency department chief complaint of chest pain.  Afebrile nontachycardic hemoglobin stable.  Appears to be in bigeminy on the monitor is significantly troubled by myself.  EKG similar.  No ST segment changes acute ischemia.  Initial troponin not significantly elevated.  She does have stable chronic anemia.  No significant metabolic derangements.  Chest x-ray without pneumonia, pneumothorax or wide mediastinum.  Case discussed with cardiology as patient is planned  to have cath on 8/7 per chart review.  They will admit patient.  She did receive aspirin nitro prior to arrival per family.  Will admit for chest pain.  Amount and/or Complexity of Data Reviewed External Data Reviewed: notes.    Details: Significant CAD with planned left heart cath 8/7. Labs: ordered. Decision-making details documented in ED Course. Radiology: ordered. Decision-making details documented in ED Course. ECG/medicine tests:  Decision-making details documented in ED Course.  Risk Decision regarding hospitalization.         Final Clinical Impression(s) / ED Diagnoses Final diagnoses:  Chest pain, unspecified type    Rx / DC Orders ED Discharge Orders     None         Coral Spikes, DO 02/03/23 2100

## 2023-02-03 NOTE — H&P (Signed)
Cardiology Admission History and Physical   Patient ID: Rachel Clark MRN: [id number]; DOB: [birth year]   Admission date: 02/03/2023  PCP:  Laurann Montana, MD   [facility] Providers Cardiologist:  Peter Swaziland, MD        Chief Complaint:  chest pain  Patient Profile:   Rachel Clark is a 72 y.o. female with CAD (remote NSTEMI s/p PCI to RCA, s/p CABG 2007, subsequent failure of SVG-RCA by cath in 2016, DESx3 - full metal jacket- to RCA in 11/2015, NSTEMI 03/2021 with DESx3 to ramus intermedius), NSTEMI  CHB/syncope s/p PPM in 03/2015, recurrent syncope/dizziness then felt due to orthostasis, GIB 03/2021 s/p transfusion with gastric antrum vessel s/p epi/clipping, HL with statin intolerance, colon CA s/p resection and chemotherapy, seizures, memory loss, chronic hyponatremia, fibromyalgia, GERD, hypothyroidism, former smoker  , NSTEMI 10/2021- s/p complex PCI to LCX. who is being seen 02/03/2023 for the evaluation of chest pain.    History of Present Illness:   Rachel Clark is a 72 y.o. female with CAD (remote NSTEMI s/p PCI to RCA, s/p CABG 2007, subsequent failure of SVG-RCA by cath in 2016, DESx3 - full metal jacket- to RCA in 11/2015, NSTEMI 03/2021 with DESx3 to ramus intermedius), NSTEMI  CHB/syncope s/p PPM in 03/2015, recurrent syncope/dizziness then felt due to orthostasis, GIB 03/2021 s/p transfusion with gastric antrum vessel s/p epi/clipping, HL with statin intolerance, colon CA s/p resection and chemotherapy, seizures, memory loss, chronic hyponatremia, fibromyalgia, GERD, hypothyroidism, former smoker  , NSTEMI 10/2021- s/p complex PCI to LCX. who is being seen 02/03/2023 for the evaluation of chest pain.  Per prior reports She was placed on Effient due to interaction of Brilinta with Tegretol and history of rash on Plavix.   Pt with increased recurrent chest pains- seen in clinic yesterday- referred for Outpatient CATH next week, is here now with chest pain- some  improvement with NTG EKG NSR with frequent PVCs, trop negative, hb stable 8.7 but lower than before- prior h/o GIB  This evening started having 7/10 chest pain- pressure like pain, somewhat relieved with NTG but still persisted thus came to the ER. She was just sitting when CP started. Able to do ADLS but not a good historian, husband at bedside reports pt has some memory loss lately, able to walk only <1/2block. ER; Trop negative, EKG_ NSR with PVCs, BP stable  Past Medical History:  Diagnosis Date   Arthritis    "fingers" (06/29/2016)   Chronic lower back pain    Colon cancer (HCC) 12/04/2011   s/p Laparoscopic-assisted transverse colectomy on 12/19/2011 by Dr. Dwain Sarna.  pT3 N0 M0.    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   last seizure was in 1972 ;takes Tegretol (06/29/2016)    Headache    "weekly" (06/29/2016)   Hyperlipidemia    Hyponatremia    Hypothyroid    Iron deficiency anemia 11/17/2011   Memory change 11/13/2016   Monoclonal gammopathy of unknown significance    Orthostatic hypotension    Presence of permanent cardiac pacemaker    Syncope and collapse    pacemaker implanted    Past Surgical History:  Procedure Laterality Date   ANTERIOR CERVICAL DECOMP/DISCECTOMY FUSION  1980's   BACK SURGERY  CARDIAC CATHETERIZATION  05/20/2009   obstructive native vessel disease in LAD, RCA, and first diagonal, patent vein graft to distal RCA and LIMA to LAD,normal. ef 60%   CARDIAC CATHETERIZATION N/A 03/24/2015   Procedure: Left Heart Cath and Cors/Grafts Angiography;  Surgeon: Iran Ouch, MD;  Location: [facility] INVASIVE CV LAB;  Service: Cardiovascular;  Laterality: N/A;   CARDIAC CATHETERIZATION N/A 07/28/2015   Procedure: Left Heart Cath and Coronary Angiography;  Surgeon: Lennette Bihari, MD;  Location: [facility] INVASIVE CV LAB;  Service: Cardiovascular;  Laterality: N/A;   CARDIAC CATHETERIZATION N/A 11/09/2015   Procedure: Coronary Stent Intervention;  Surgeon: Tonny Bollman, MD;  Location: [facility] INVASIVE CV LAB;  Service: Cardiovascular;  Laterality: N/A;   CARDIAC CATHETERIZATION N/A 11/09/2015   Procedure: Left Heart Cath and Coronary Angiography;  Surgeon: Tonny Bollman, MD;  Location: [facility] INVASIVE CV LAB;  Service: Cardiovascular;  Laterality: N/A;   CARDIAC CATHETERIZATION N/A 06/29/2016   Procedure: Left Heart Cath and Coronary Angiography;  Surgeon: Yvonne Kendall, MD;  Location: [facility] INVASIVE CV LAB;  Service: Cardiovascular;  Laterality: N/A;   CARDIAC CATHETERIZATION N/A 06/29/2016   Procedure: Intravascular Pressure Wire/FFR Study;  Surgeon: Yvonne Kendall, MD;  Location: [facility] INVASIVE CV LAB;  Service: Cardiovascular;  Laterality: N/A;   CARDIAC CATHETERIZATION N/A 06/29/2016   Procedure: Coronary Balloon Angioplasty;  Surgeon: Yvonne Kendall, MD;  Location: [facility] INVASIVE CV LAB;  Service: Cardiovascular;  Laterality: N/A;   COLON RESECTION  12/19/2011   Procedure: COLON RESECTION LAPAROSCOPIC;  Surgeon: Emelia Loron, MD;  Location: [facility] OR;  Service: General;  Laterality: N/A;  laparoscopic hand assisted partial colon resection   COLON SURGERY     COLONOSCOPY     CORONARY ANGIOPLASTY WITH STENT PLACEMENT  ~ 2007   1 stent   CORONARY ARTERY BYPASS GRAFT  2007   "CABG X2"   DILATION AND CURETTAGE OF UTERUS  1973   EP IMPLANTABLE DEVICE N/A 03/25/2015   MDT Advisa DR pacemaker implanted by Dr Johney Frame for transient complete heart block and syncope   ESOPHAGOGASTRODUODENOSCOPY (EGD) WITH PROPOFOL N/A 03/14/2021   Procedure: ESOPHAGOGASTRODUODENOSCOPY (EGD) WITH PROPOFOL;  Surgeon: Kerin Salen, MD;  Location: [facility] ENDOSCOPY;  Service: Gastroenterology;  Laterality: N/A;   ESOPHAGOGASTRODUODENOSCOPY (EGD) WITH PROPOFOL N/A 11/13/2021   Procedure: ESOPHAGOGASTRODUODENOSCOPY (EGD)  WITH PROPOFOL;  Surgeon: Kathi Der, MD;  Location: [facility] ENDOSCOPY;  Service: Gastroenterology;  Laterality: N/A;   HEMOSTASIS CLIP PLACEMENT  03/14/2021   Procedure: HEMOSTASIS CLIP PLACEMENT;  Surgeon: Kerin Salen, MD;  Location: [facility] ENDOSCOPY;  Service: Gastroenterology;;   INSERT / REPLACE / REMOVE PACEMAKER     LEFT HEART CATH AND CORONARY ANGIOGRAPHY N/A 11/11/2021   Procedure: LEFT HEART CATH AND CORONARY ANGIOGRAPHY;  Surgeon: Marykay Lex, MD;  Location: [facility] INVASIVE CV LAB;  Service: Cardiovascular;  Laterality: N/A;   LEFT HEART CATH AND CORS/GRAFTS ANGIOGRAPHY N/A 02/21/2018   Procedure: LEFT HEART CATH AND CORS/GRAFTS ANGIOGRAPHY;  Surgeon: Runell Gess, MD;  Location: [facility] INVASIVE CV LAB;  Service: Cardiovascular;  Laterality: N/A;   LEFT HEART CATH AND CORS/GRAFTS ANGIOGRAPHY N/A 03/08/2021   Procedure: LEFT HEART CATH AND CORS/GRAFTS ANGIOGRAPHY;  Surgeon: Swaziland, Peter M, MD;  Location: [facility] INVASIVE CV LAB;  Service: Cardiovascular;  Laterality: N/A;   PORT-A-CATH REMOVAL N/A 06/12/2013   Procedure: REMOVAL PORT-A-CATH;  Surgeon: Emelia Loron, MD;  Location: [facility];  Service: General;  Laterality: N/A;   PORTACATH PLACEMENT  06/04/2012   Procedure: INSERTION PORT-A-CATH;  Surgeon: Molli Hazard  Dwain Sarna, MD;  Location: [facility] OR;  Service: General;  Laterality: N/A;  Insertion of port-a-cath    POSTERIOR LAMINECTOMY / DECOMPRESSION CERVICAL SPINE  1990s   SCLEROTHERAPY  03/14/2021   Procedure: SCLEROTHERAPY;  Surgeon: Kerin Salen, MD;  Location: [facility] ENDOSCOPY;  Service: Gastroenterology;;   VAGINAL HYSTERECTOMY       Medications Prior to Admission: Prior to Admission medications   Medication Sig Start Date End Date Taking? Authorizing Provider  acetaminophen (TYLENOL) 500 MG tablet Take 500-1,000 mg by mouth daily as needed for mild pain or headache.    [provider]  aspirin 81 MG chewable tablet Chew 1 tablet (81 mg total) by mouth daily.  06/30/16   Little Ishikawa, NP  Azelastine HCl 0.15 % SOLN Place 1 spray into both nostrils daily as needed (seasonal allergies). 12/22/14   [provider]  Bempedoic Acid-Ezetimibe (NEXLIZET) 180-10 MG TABS Take 180 mg by mouth daily. 04/25/22   Swaziland, Peter M, MD  calcium carbonate (TUMS - DOSED IN MG ELEMENTAL CALCIUM) 500 MG chewable tablet Chew 500 mg by mouth daily.    [provider]  carbamazepine (TEGRETOL XR) 200 MG 12 hr tablet Take 3 tablets (600 mg total) by mouth 2 (two) times daily. 09/19/22 12/13/23  Glean Salvo, NP  Cholecalciferol (VITAMIN D) 50 MCG (2000 UT) CAPS Take 4,000 Units by mouth daily.    [provider]  diphenhydrAMINE (BENADRYL) 25 MG tablet Take 25 mg by mouth daily as needed for allergies.    [provider]  diphenhydramine-acetaminophen (TYLENOL PM) 25-500 MG TABS tablet Take 1 tablet by mouth at bedtime as needed (Sleep).    [provider]  DULoxetine (CYMBALTA) 60 MG capsule Take 60 mg by mouth 2 (two) times daily.    [provider]  ethosuximide (ZARONTIN) 250 MG capsule TAKE 2 CAPSULES IN THE MORNING, TAKE 2 CAPSULES AT NIGHT 07/11/22   Glean Salvo, NP  Evolocumab (REPATHA SURECLICK) 140 MG/ML SOAJ Inject 140 mg into the skin every 14 (fourteen) days. 12/25/22   Swaziland, Peter M, MD  ferrous sulfate 325 (65 FE) MG EC tablet Take 325 mg by mouth daily with breakfast.    [provider]  fluticasone (FLONASE) 50 MCG/ACT nasal spray Place 1 spray into both nostrils daily as needed for allergies.    [provider]  isosorbide mononitrate (IMDUR) 30 MG 24 hr tablet TAKE HALF TABLET (15MG  TOTAL) BY MOUTH EVERY DAY 04/20/22   Swaziland, Peter M, MD  levothyroxine (SYNTHROID, LEVOTHROID) 50 MCG tablet Take 50 mcg by mouth daily before breakfast.    [provider]  memantine (NAMENDA) 10 MG tablet Take 1 tablet (10 mg total) by mouth 2 (two) times daily. 09/14/22   Glean Salvo, NP   nitroGLYCERIN (NITROSTAT) 0.4 MG SL tablet place 1 tablet under the tongue every 5 minutes as needed for chest pain 01/24/23   Swaziland, Peter M, MD  Oral Electrolytes (THERMOTABS PO) Take 452 mg by mouth daily. Thermotabs 452 mg sodium chloride    [provider]  pantoprazole (PROTONIX) 40 MG tablet TAKE ONE TABLET BY MOUTH TWICE DAILY FOR 30 DAYS THEN TAKE ONE TABLET BY MOUTH DAILY Patient taking differently: Take 40 mg by mouth daily. 02/08/22   Swaziland, Peter M, MD  prasugrel (EFFIENT) 10 MG TABS tablet Take 1 tablet (10 mg total) by mouth daily. 04/25/22   [place]il[place], Peter M, MD  ranolazine (RANEXA) 500 MG 12 hr tablet Take 1 tablet (  500 mg total) by mouth 2 (two) times daily. 02/02/23   Duke Salvia, MD     Allergies:    Allergies  Allergen Reactions   Sulfamethoxazole Anaphylaxis    Don't recall   Aspirin Other (See Comments)    GI upset- can tolerate 81 mg ASA, just not full doses   Crestor [Rosuvastatin]     myalgias   Erythromycin Nausea Only    Gi upset   Lisinopril Cough   Niacin And Related Other (See Comments)    Don't recall   Penicillin G Benzathine     Other reaction(s): Unknown   Statins Other (See Comments)    Muscle pain   Sulfa Drugs Cross Reactors Swelling    Don't recall   Tetracyclines & Related Nausea Only   Xeloda [Capecitabine] Diarrhea   Penicillins Nausea Only and Rash        Plavix [Clopidogrel Bisulfate] Rash    Social History:   Social History   Socioeconomic History   Marital status: Married    Spouse name: Not on file   Number of children: 2   Years of education: 12   Highest education level: Not on file  Occupational History   Occupation: Retired  Tobacco Use   Smoking status: Former    Current packs/day: 0.00    Average packs/day: 0.3 packs/day for 20.0 years (6.0 ttl pk-yrs)    Types: Cigarettes    Start date: 06/06/1961    Quit date: 06/06/1981    Years since quitting: 41.6   Smokeless tobacco: Never  Vaping Use   Vaping  status: Never Used  Substance and Sexual Activity   Alcohol use: Yes    Alcohol/week: 1.0 standard drink of alcohol    Types: 1 Glasses of wine per week    Comment: occ   Drug use: No   Sexual activity: Not Currently    Birth control/protection: Surgical  Other Topics Concern   Not on file  Social History Narrative   Patient is married with 2 children.   Patient is right handed.   Patient has a high school education with some college education.   Patient drinks 2 cups daily.   Social Determinants of Health   Financial Resource Strain: Not on file  Food Insecurity: Not on file  Transportation Needs: Not on file  Physical Activity: Not on file  Stress: Not on file  Social Connections: Not on file  Intimate Partner Violence: Not on file    Family History:   The patient's family history includes Cancer in her mother; Heart attack in her father; Heart attack (age of onset: 48) in her brother; Heart disease in her father. There is no history of Stroke.    ROS:  Please see the history of present illness.  All other ROS reviewed and negative.     Physical Exam/Data:   Vitals:   02/03/23 1823 02/03/23 1902  BP:  (!) 151/68  Pulse:  80  Resp:  17  SpO2: 99% 99%  Weight:  82.6 kg  Height:  5\' 8"  (1.727 m)   No intake or output data in the 24 hours ending 02/03/23 2011    02/03/2023    7:02 PM 02/02/2023   10:29 AM 01/23/2023    9:02 AM  Last 3 Weights  Weight (lbs) 182 lb 182 lb 184 lb  Weight (kg) 82.555 kg 82.555 kg 83.462 kg     Body mass index is 27.67 kg/m.  General:  Well nourished, well  developed, in no acute distress HEENT: normal Neck: no JVD Vascular: No carotid bruits; Distal pulses 2+ bilaterally   Cardiac:  normal S1, S2; RRR; no murmur  Lungs:  clear to auscultation bilaterally, no wheezing, rhonchi or rales  Abd: soft, nontender, no hepatomegaly  Ext: no edema Musculoskeletal:  No deformities, BUE and BLE strength normal and equal Skin: warm and dry   Neuro:  CNs 2-12 intact, no focal abnormalities noted Psych:  Normal affect    EKG:  The ECG that was done NSR PVCs  Relevant CV Studies:   Laboratory Data:  High Sensitivity Troponin:   Recent Labs  Lab 01/23/23 0905 01/23/23 1055 01/28/23 1832 01/28/23 2125 02/03/23 1901  TROPONINIHS 7 7 8 8 9       Chemistry Recent Labs  Lab 01/28/23 1832 02/02/23 1213 02/03/23 1901  NA 135 138 136  K 5.2* 4.7 3.8  CL 103 100 105  CO2 19* 24 22  GLUCOSE 118* 108* 119*  BUN 19 18 18   CREATININE 0.86 0.88 0.73  CALCIUM 9.5 9.7 8.8*  GFRNONAA >60  --  >60  ANIONGAP 13  --  9    Recent Labs  Lab 01/28/23 2125  PROT 6.5  ALBUMIN 3.2*  AST 20  ALT 13  ALKPHOS 50  BILITOT 0.4   Lipids No results for input(s): "CHOL", "TRIG", "HDL", "LABVLDL", "LDLCALC", "CHOLHDL" in the last 168 hours. Hematology Recent Labs  Lab 02/02/23 1213 02/03/23 1901  WBC 8.0 7.0  RBC 4.15 3.76*  HGB 9.4* 8.7*  HCT 30.3* 28.5*  MCV 73* 75.8*  MCH 22.7* 23.1*  MCHC 31.0* 30.5  RDW 15.5* 16.4*  PLT 398 356   Thyroid No results for input(s): "TSH", "FREET4" in the last 168 hours. BNPNo results for input(s): "BNP", "PROBNP" in the last 168 hours.  DDimer No results for input(s): "DDIMER" in the last 168 hours.   Radiology/Studies:  DG Chest Portable 1 View  Result Date: 02/03/2023 CLINICAL DATA:  Chest pain EXAM: PORTABLE CHEST 1 VIEW COMPARISON:  01/28/2023 FINDINGS: Prior CABG. Left pacer remains in place, unchanged. Heart and mediastinal contours within normal limits. No confluent airspace opacities or effusions. No acute bony abnormality. IMPRESSION: No active disease. Electronically Signed   By: Charlett Nose M.D.   On: 02/03/2023 18:50     Cardiac cath 11/11/21:  LEFT HEART CATH AND CORONARY ANGIOGRAPHY    Conclusion       Ost LAD to Prox LAD lesion is 60% stenosed. Prox LAD lesion is 100% stenosed.   LIMA-graft was visualized by angiography and is normal in caliber.  The graft  exhibits no disease.   SVG-dRCA: Origin lesion is 100% stenosed.  graft was not visualized due to known occlusion.   Ost RCA to Mid RCA overlapping full metal jacket DES stents, widely patent.   Ost 1st Diag to 1st Diag lesion is 85% stenosed.   ----------------------   Lesion segment #2 (all in-stent restenosis) Ost Ramus to Ramus lesion is 75% stenosed.  Ramus-2 lesion is 85% stenosed.  Ramus-1 lesion is 35% stenosed.   Scoring balloon angioplasty was performed using a BALLN SCOREFLEX 2.50X15 -> followed by post dilation with a [place] Balloon 3.0 mm x 15 mm   Post intervention, there is a 20% residual stenosis  in the proximal portion of stent segment.  Post intervention, there is a 0% residual stenosis in the distal portion of the stent.   CULPRIT LESION #1: Ramus-3 lesion is 95% stenosed.-2 tandem locations of  calcified stenosis.   Scoring balloon angioplasty was performed using a BALLN SCOREFLEX 2.50X15.   A drug-eluting stent was successfully placed (overlapping the distal stent) using a STENT ONYX FRONTIER 2.5X30 -> tapered postdilation from overlap 3.1 mm down to 2.8 mm distally.   Post intervention, there is a 0% residual stenosis.   ------------------------------------------------------------------------------   LV end diastolic pressure is normal.   There is no aortic valve stenosis.   SUMMARY Severe Native vessel CAD: stable severe proximal to mid LAD disease with LAD occlusion and patent LIMA-LAD,  widely patent full metal jacket stented RCA,  diffuse severe in-stent restenosis throughout the proximal and midportion of the stented segment of the native LCx with severe eccentric calcified lesions distal to the stent. Difficult/complex but successful scoring balloon angioplasty with shockwave lithotripsy followed by distal overlapping DES stent placement (Onyx Frontier DES 2.5 x 30 tapered postdilation from 3.1 in the overlap segment to 2.8 distally) Scoreflex angioplasty followed by  post dilation with 3.0 mm [place] balloon throughout the entire stented segment of the LCx reducing stenosis in the midportion to 10% and more proximally to 20%. Normal LVEDP   RECOMMENDATION Monitor overnight, anticipate discharge in the morning. Aggressive risk factor modification Essentially lifelong finding.  In coverage with DAPT x1 year       Bryan Lemma, MD   Echo 11/12/21: IMPRESSIONS     1. Left ventricular ejection fraction, by estimation, is 60 to 65%. The  left ventricle has normal function. The left ventricle has no regional  wall motion abnormalities. There is mild left ventricular hypertrophy.  Left ventricular diastolic parameters  are indeterminate.   2. Right ventricular systolic function is normal. The right ventricular  size is normal. Tricuspid regurgitation signal is inadequate for assessing  PA pressure.   3. The mitral valve is abnormal. Mild mitral valve regurgitation. No  evidence of mitral stenosis.   4. The aortic valve is tricuspid. Aortic valve regurgitation is not  visualized. No aortic stenosis is present.   5. The inferior vena cava is normal in size with greater than 50%  respiratory variability, suggesting right atrial pressure of 3 mmHg.     Assessment and Plan:   Chest pain. CAD. S/p CABG 2007 (LIMA-LAD, occluded SVG-dRCA),. S/p extensive stenting of RCA (full metal jacket)in 2017. S/p NSTEMI with complex stenting of a large ramus intermediate branch with DES x 3 in September 2022.  May 2023 with NSTEMI and complex PCI of the LCx by Dr Herbie Baltimore  H/o orthostatic hypotension RVOT PVC SSS s/p pacemaker Iron deficiency anemia  Plan: - continue home DAPT, imdur, ranexa and other medications. Increased ranexa to 1000mg  BID. Hb is down to 8.7- pt denies GI bleeding, dark stools but was taking chewable aspirin instead of enteric coated ones. She is severely iron deficient- continue PO iron- would benefit from IV iron. Prior h/o Upper GIB- s/p clipping  in gastric antrum in May 2023. - Plan for repeat LHC on Monday, NPO after MN (initially had outpatient cath scheduled for Aug 7th). Please note: She was placed on Effient due to interaction of Brilinta with Tegretol and history of rash on Plavix.   - Trend trops, EKG and ECHO in am - continue anti lipid therapies - aggressive risk factor modification   Risk Assessment/Risk Scores:          Code Status: Full Code  Severity of Illness: The appropriate patient status for this patient is INPATIENT. Inpatient status is judged to be reasonable and necessary  in order to provide the required intensity of service to ensure the patient's safety. The patient's presenting symptoms, physical exam findings, and initial radiographic and laboratory data in the context of their chronic comorbidities is felt to place them at high risk for further clinical deterioration. Furthermore, it is not anticipated that the patient will be medically stable for discharge from the hospital within 2 midnights of admission.   * I certify that at the point of admission it is my clinical judgment that the patient will require inpatient hospital care spanning beyond 2 midnights from the point of admission due to high intensity of service, high risk for further deterioration and high frequency of surveillance required.*   For questions or updates, please contact [facility] Please consult [email or web address] for contact info under     Signed, Elmon Kirschner, MD  02/03/2023 8:11 PM

## 2023-02-04 ENCOUNTER — Other Ambulatory Visit (HOSPITAL_COMMUNITY): Payer: Medicare Other

## 2023-02-04 ENCOUNTER — Inpatient Hospital Stay (HOSPITAL_COMMUNITY): Payer: Medicare Other

## 2023-02-04 DIAGNOSIS — R079 Chest pain, unspecified: Secondary | ICD-10-CM | POA: Diagnosis not present

## 2023-02-04 DIAGNOSIS — D509 Iron deficiency anemia, unspecified: Secondary | ICD-10-CM

## 2023-02-04 DIAGNOSIS — I25709 Atherosclerosis of coronary artery bypass graft(s), unspecified, with unspecified angina pectoris: Secondary | ICD-10-CM

## 2023-02-04 DIAGNOSIS — D62 Acute posthemorrhagic anemia: Secondary | ICD-10-CM

## 2023-02-04 DIAGNOSIS — R008 Other abnormalities of heart beat: Secondary | ICD-10-CM | POA: Diagnosis not present

## 2023-02-04 LAB — PREPARE RBC (CROSSMATCH)

## 2023-02-04 LAB — TYPE AND SCREEN
ABO/RH(D): O NEG
Antibody Screen: NEGATIVE
Unit division: 0

## 2023-02-04 LAB — BASIC METABOLIC PANEL WITH GFR
Anion gap: 9 (ref 5–15)
BUN: 17 mg/dL (ref 8–23)
CO2: 23 mmol/L (ref 22–32)
Calcium: 8.9 mg/dL (ref 8.9–10.3)
Chloride: 105 mmol/L (ref 98–111)
Creatinine, Ser: 0.78 mg/dL (ref 0.44–1.00)
GFR, Estimated: >60 mL/min (ref >60)
Glucose, Bld: 109 mg/dL — ABNORMAL HIGH (ref 70–99)
Potassium: 3.8 mmol/L (ref 3.5–5.1)
Sodium: 137 mmol/L (ref 135–145)

## 2023-02-04 LAB — HEMOGLOBIN AND HEMATOCRIT, BLOOD
HCT: 31.1 % — ABNORMAL LOW (ref 36.0–46.0)
Hemoglobin: 9.8 g/dL — ABNORMAL LOW (ref 12.0–15.0)

## 2023-02-04 LAB — BPAM RBC
Blood Product Expiration Date: 202408082359
ISSUE DATE / TIME: 202408041618
Unit Type and Rh: 9500

## 2023-02-04 LAB — TROPONIN I (HIGH SENSITIVITY): Troponin I (High Sensitivity): 9 ng/L (ref <18)

## 2023-02-04 LAB — CBC
HCT: 28 % — ABNORMAL LOW (ref 36.0–46.0)
Hemoglobin: 8.6 g/dL — ABNORMAL LOW (ref 12.0–15.0)
MCH: 23.6 pg — ABNORMAL LOW (ref 26.0–34.0)
MCHC: 30.7 g/dL (ref 30.0–36.0)
MCV: 76.7 fL — ABNORMAL LOW (ref 80.0–100.0)
Platelets: 348 10*3/uL (ref 150–400)
RBC: 3.65 MIL/uL — ABNORMAL LOW (ref 3.87–5.11)
RDW: 16.4 % — ABNORMAL HIGH (ref 11.5–15.5)
WBC: 6.7 10*3/uL (ref 4.0–10.5)
nRBC: 0 % (ref 0.0–0.2)

## 2023-02-04 LAB — ECHOCARDIOGRAM COMPLETE
AR max vel: 3.64 cm2
AV Area VTI: 3.59 cm2
AV Area mean vel: 3.49 cm2
AV Mean grad: 3.3 mmHg
AV Peak grad: 6 mmHg
Ao pk vel: 1.22 m/s
Area-P 1/2: 4.55 cm2
Height: 68 in
S' Lateral: 3.9 cm
Weight: 2908.8 [oz_av]

## 2023-02-04 LAB — MAGNESIUM: Magnesium: 2 mg/dL (ref 1.7–2.4)

## 2023-02-04 MED ORDER — SODIUM CHLORIDE 0.9% IV SOLUTION: Freq: Once | INTRAVENOUS | Status: AC

## 2023-02-04 MED ORDER — SODIUM CHLORIDE 0.9 % WEIGHT BASED INFUSION: 3.0000 mL/kg/h | INTRAVENOUS | Status: DC

## 2023-02-04 MED ORDER — SODIUM CHLORIDE 0.9 % WEIGHT BASED INFUSION
3.0000 mL/kg/h | INTRAVENOUS | Status: DC
  Administered 2023-02-05: 3 mL/kg/h via INTRAVENOUS

## 2023-02-04 MED ORDER — SODIUM CHLORIDE 0.9 % WEIGHT BASED INFUSION: 1.0000 mL/kg/h | INTRAVENOUS | Status: DC

## 2023-02-04 MED ORDER — ASPIRIN 81 MG PO CHEW
81.0000 mg | CHEWABLE_TABLET | ORAL | Status: AC
  Administered 2023-02-05: 81 mg via ORAL
  Filled 2023-02-04: qty 1

## 2023-02-04 MED ORDER — ONDANSETRON HCL 4 MG/2ML IJ SOLN
4.0000 mg | Freq: Four times a day (QID) | INTRAMUSCULAR | Status: DC | PRN
  Filled 2023-02-04: qty 2

## 2023-02-04 NOTE — Progress Notes (Signed)
*  PRELIMINARY RESULTS* Echocardiogram 2D Echocardiogram has been performed.  Rachel Clark 02/04/2023, 3:14 PM

## 2023-02-04 NOTE — Progress Notes (Signed)
Progress Note  Patient Name: Rachel Clark Date of Encounter: 02/04/2023  Primary Cardiologist: Peter Swaziland, MD  Subjective   No acute events overnight, continues to have chest pressure and ventricular bigeminy on the telemetry.  Inpatient Medications    Scheduled Meds:  aspirin  81 mg Oral Daily   Bempedoic Acid-Ezetimibe   Oral Daily   calcium carbonate  500 mg Oral Daily   carbamazepine  600 mg Oral BID   cholecalciferol  4,000 Units Oral Daily   DULoxetine  60 mg Oral BID   ethosuximide  500 mg Oral BID   ferrous sulfate  325 mg Oral Q breakfast   heparin  5,000 Units Subcutaneous Q8H   isosorbide mononitrate  30 mg Oral Daily   levothyroxine  50 mcg Oral QAC breakfast   memantine  10 mg Oral BID   pantoprazole  40 mg Oral Daily   prasugrel  10 mg Oral Daily   ranolazine  1,000 mg Oral BID   Continuous Infusions:  PRN Meds: acetaminophen, fluticasone   Vital Signs    Vitals:   02/03/23 2205 02/04/23 0005 02/04/23 0454 02/04/23 0815  BP: (!) 153/57 (!) 145/80 (!) 120/56 117/72  Pulse:  69 77   Resp: 17 16 17 17   Temp: 97.8 F (36.6 C) 97.8 F (36.6 C) 97.7 F (36.5 C) 97.6 F (36.4 C)  TempSrc: Oral Oral Oral Oral  SpO2: 97% 98% 98%   Weight:      Height:        Intake/Output Summary (Last 24 hours) at 02/04/2023 1100 Last data filed at 02/03/2023 2300 Gross per 24 hour  Intake 240 ml  Output --  Net 240 ml   Filed Weights   02/03/23 1902 02/03/23 2202  Weight: 82.6 kg 82.5 kg    Telemetry     Personally reviewed.  ECG     Physical Exam   GEN: No acute distress.   Neck: No JVD. Cardiac: RRR, no murmur, rub, or gallop.  Respiratory: Nonlabored. Clear to auscultation bilaterally. GI: Soft, nontender, bowel sounds present. MS: No edema; No deformity. Neuro:  Nonfocal. Psych: Alert and oriented x 3. Normal affect.  Labs    Chemistry Recent Labs  Lab 01/28/23 1832 01/28/23 2125 02/02/23 1213 02/03/23 1901 02/03/23 2238  02/04/23 0020  NA 135  --  138 136  --  137  K 5.2*  --  4.7 3.8  --  3.8  CL 103  --  100 105  --  105  CO2 19*  --  24 22  --  23  GLUCOSE 118*  --  108* 119*  --  109*  BUN 19  --  18 18  --  17  CREATININE 0.86  --  0.88 0.73 0.81 0.78  CALCIUM 9.5  --  9.7 8.8*  --  8.9  PROT  --  6.5  --   --   --   --   ALBUMIN  --  3.2*  --   --   --   --   AST  --  20  --   --   --   --   ALT  --  13  --   --   --   --   ALKPHOS  --  50  --   --   --   --   BILITOT  --  0.4  --   --   --   --   GFRNONAA >  60  --   --  >60 >60 >60  ANIONGAP 13  --   --  9  --  9     Hematology Recent Labs  Lab 02/03/23 1901 02/03/23 2238 02/04/23 0020  WBC 7.0 6.5 6.7  RBC 3.76* 3.67* 3.65*  HGB 8.7* 8.4* 8.6*  HCT 28.5* 27.6* 28.0*  MCV 75.8* 75.2* 76.7*  MCH 23.1* 22.9* 23.6*  MCHC 30.5 30.4 30.7  RDW 16.4* 16.5* 16.4*  PLT 356 352 348    Cardiac Enzymes Recent Labs  Lab 01/28/23 1832 01/28/23 2125 02/03/23 1901 02/03/23 2238 02/04/23 0020  TROPONINIHS 8 8 9 9 9     BNPNo results for input(s): "BNP", "PROBNP" in the last 168 hours.   DDimerNo results for input(s): "DDIMER" in the last 168 hours.   Radiology    DG Chest Portable 1 View  Result Date: 02/03/2023 CLINICAL DATA:  Chest pain EXAM: PORTABLE CHEST 1 VIEW COMPARISON:  01/28/2023 FINDINGS: Prior CABG. Left pacer remains in place, unchanged. Heart and mediastinal contours within normal limits. No confluent airspace opacities or effusions. No acute bony abnormality. IMPRESSION: No active disease. Electronically Signed   By: Charlett Nose M.D.   On: 02/03/2023 18:50     Assessment & Plan     # Acute blood loss anemia # Severe iron deficiency anemia -Ongoing persistent chest pressure x 2 weeks and fatigue x 1 month. Hemoglobin 10.5 around 2 weeks ago and gradually dropped to 8.4 on admission. Iron panel on admission showed serum iron 22, iron saturation 7% and ferritin 11. No active bleeding, no melena or hematochezia.  I think  her persistent chest pain is likely related to anemia as she reports the quality of  current chest pain is different from her previous chest pains when she had PCI placed. Will transfuse 1 unit of PRBC today. She needs to undergo diagnostic LHC tomorrow to rule out ischemia and GI consultation after cath for EGD and colonoscopy. It safe to place PCI after GI source of bleeding is ruled out. -Would not recommend IV iron due to possibility of coronary spasm and it is not studied in CAD patients yet. -Currently on Effient due to interaction Brilinta with Tegretol and history of rash on Plavix. -She also has history of colon cancer s transverse colectomy in 2012. GI consult after LHC due to acute blood loss anemia and severe iron deficiency anemia.  # CAD (CABG in 2007, RCA PCI metal jacket in 2017, ramus PCI in 2022, LCx PCI in 2023) -On Imdur 30 mg once daily, home dose of Ranexa increased from 500 to 1000 mg twice daily on admission. -Patient has persistent chest pressure in the last [redacted] weeks along with fatigue and low blood pressures coinciding with hemoglobin drop (from 10 ish to 8 ish).  She will benefit from [facility] to rule out ischemia due to her complicated CAD history.  # Ventricular bigeminy likely secondary to severe iron deficiency anemia -Treat underlying etiology, PRBC transfusion today.  # SSS s/p PPM -Follows with EP   Signed, Eulises Kijowski P Shmiel Morton, MD  02/04/2023, 11:00 AM

## 2023-02-04 NOTE — Progress Notes (Signed)
   Diagnostic ONLY LHC ordered for 02/05/23 at the request of Dr. Jenene Slicker. Patient with significant CAD history continues to have chest pressure. Per Dr. Jenene Slicker progress note today:  "Ongoing persistent chest pressure x 2 weeks and fatigue x 1 month. Hemoglobin 10.5 around 2 weeks ago and gradually dropped to 8.4 on admission. Iron panel on admission showed serum iron 22, iron saturation 7% and ferritin 11. No active bleeding, no melena or hematochezia.  I think her persistent chest pain is likely related to anemia as she reports the quality of  current chest pain is different from her previous chest pains when she had PCI placed. Will transfuse 1 unit of PRBC today. She needs to undergo diagnostic LHC tomorrow to rule out ischemia and GI consultation after cath for EGD and colonoscopy. It safe to place PCI after GI source of bleeding is ruled out. -Would not recommend IV iron due to possibility of coronary spasm and it is not studied in CAD patients yet. -Currently on Effient due to interaction Brilinta with Tegretol and history of rash on Plavix. -She also has history of colon cancer s transverse colectomy in 2012. GI consult after LHC due to acute blood loss anemia and severe iron deficiency anemia."  Perlie Gold, PA-C

## 2023-02-05 ENCOUNTER — Encounter (HOSPITAL_COMMUNITY)
Admission: EM | Disposition: A | Discharge status: Home / Self Care | Payer: Self-pay | Source: Home / Self Care | Attending: Cardiovascular Disease

## 2023-02-05 ENCOUNTER — Ambulatory Visit (HOSPITAL_COMMUNITY): Admission: RE | Admit: 2023-02-05 | Payer: Medicare Other | Source: Home / Self Care | Admitting: Cardiovascular Disease

## 2023-02-05 DIAGNOSIS — R0789 Other chest pain: Secondary | ICD-10-CM | POA: Diagnosis not present

## 2023-02-05 DIAGNOSIS — D500 Iron deficiency anemia secondary to blood loss (chronic): Secondary | ICD-10-CM

## 2023-02-05 LAB — BASIC METABOLIC PANEL
Anion gap: 6 (ref 5–15)
BUN: 14 mg/dL (ref 8–23)
CO2: 24 mmol/L (ref 22–32)
Calcium: 8.2 mg/dL — ABNORMAL LOW (ref 8.9–10.3)
Chloride: 102 mmol/L (ref 98–111)
Creatinine, Ser: 1.18 mg/dL — ABNORMAL HIGH (ref 0.44–1.00)
GFR, Estimated: 49 mL/min — ABNORMAL LOW (ref >60)
Glucose, Bld: 103 mg/dL — ABNORMAL HIGH (ref 70–99)
Potassium: 3.8 mmol/L (ref 3.5–5.1)
Sodium: 132 mmol/L — ABNORMAL LOW (ref 135–145)

## 2023-02-05 LAB — CBC
HCT: 29.6 % — ABNORMAL LOW (ref 36.0–46.0)
Hemoglobin: 9.5 g/dL — ABNORMAL LOW (ref 12.0–15.0)
MCH: 24.4 pg — ABNORMAL LOW (ref 26.0–34.0)
MCHC: 32.1 g/dL (ref 30.0–36.0)
MCV: 76.1 fL — ABNORMAL LOW (ref 80.0–100.0)
Platelets: 332 10*3/uL (ref 150–400)
RBC: 3.89 MIL/uL (ref 3.87–5.11)
RDW: 16.1 % — ABNORMAL HIGH (ref 11.5–15.5)
WBC: 6.4 10*3/uL (ref 4.0–10.5)
nRBC: 0 % (ref 0.0–0.2)

## 2023-02-05 SURGERY — LEFT HEART CATH AND CORS/GRAFTS ANGIOGRAPHY
Anesthesia: LOCAL

## 2023-02-05 MED ORDER — IRON SUCROSE 500 MG IVPB - SIMPLE MED
500.0000 mg | INTRAVENOUS | Status: AC
  Administered 2023-02-05 – 2023-02-06 (×2): 500 mg via INTRAVENOUS
  Filled 2023-02-05 (×2): qty 275

## 2023-02-05 MED ORDER — PANTOPRAZOLE SODIUM 40 MG IV SOLR
40.0000 mg | Freq: Two times a day (BID) | INTRAVENOUS | Status: DC
  Administered 2023-02-05 – 2023-02-07 (×6): 40 mg via INTRAVENOUS
  Filled 2023-02-05 (×6): qty 10

## 2023-02-05 MED ORDER — POTASSIUM CHLORIDE CRYS ER 20 MEQ PO TBCR
20.0000 meq | EXTENDED_RELEASE_TABLET | Freq: Once | ORAL | Status: AC
  Administered 2023-02-05: 20 meq via ORAL
  Filled 2023-02-05: qty 1

## 2023-02-05 NOTE — TOC CM/SW Note (Signed)
Transition of Care [facility] - Inpatient Brief Assessment   Patient Details  Name: Rachel Clark MRN: [id number] Date of Birth: June 18, 1951  Transition of Care [facility]) CM/SW Contact:    Gala Lewandowsky, RN Phone Number: 02/05/2023, 11:58 AM   Clinical Narrative: Patient presented for chest pain-plan for LHC. Case Manager will continue to follow for transition of care needs as the patient progresses.   Transition of Care Asessment: Insurance and Status: Insurance coverage has been reviewed Patient has primary care physician: Yes  Prior/Current Home Services: No current home services Social Determinants of Health Reivew: SDOH reviewed no interventions necessary Readmission risk has been reviewed: Yes Transition of care needs: no transition of care needs at this time

## 2023-02-05 NOTE — Progress Notes (Addendum)
Rounding Note    Patient Name: Rachel Clark Date of Encounter: 02/05/2023  [facility] Cardiologist: Peter Swaziland, MD   Subjective   Reports chest pain stopped after blood transfusion. No complaints   Inpatient Medications    Scheduled Meds:  aspirin  81 mg Oral Daily   Bempedoic Acid-Ezetimibe   Oral Daily   calcium carbonate  500 mg Oral Daily   carbamazepine  600 mg Oral BID   cholecalciferol  4,000 Units Oral Daily   DULoxetine  60 mg Oral BID   ethosuximide  500 mg Oral BID   ferrous sulfate  325 mg Oral Q breakfast   heparin  5,000 Units Subcutaneous Q8H   isosorbide mononitrate  30 mg Oral Daily   levothyroxine  50 mcg Oral QAC breakfast   memantine  10 mg Oral BID   pantoprazole  40 mg Oral Daily   prasugrel  10 mg Oral Daily   ranolazine  1,000 mg Oral BID   Continuous Infusions:  sodium chloride 1 mL/kg/hr (02/05/23 0523)   PRN Meds: acetaminophen, fluticasone, ondansetron (ZOFRAN) IV   Vital Signs    Vitals:   02/04/23 1746 02/04/23 1909 02/05/23 0530 02/05/23 0757  BP: 139/71 (!) 143/84 (!) 151/61   Pulse: 80 86 79   Resp:  18 18 18   Temp:  98.1 F (36.7 C) 97.6 F (36.4 C) (!) 97.5 F (36.4 C)  TempSrc:  Oral Oral Oral  SpO2: 97% 98% 98%   Weight:      Height:        Intake/Output Summary (Last 24 hours) at 02/05/2023 0757 Last data filed at 02/04/2023 2200 Gross per 24 hour  Intake 642.5 ml  Output --  Net 642.5 ml      02/03/2023   10:02 PM 02/03/2023    7:02 PM 02/02/2023   10:29 AM  Last 3 Weights  Weight (lbs) 181 lb 12.8 oz 182 lb 182 lb  Weight (kg) 82.464 kg 82.555 kg 82.555 kg      Telemetry    Trigeminy HR 70s-80s  - Personally Reviewed  ECG    NSR with frequent PVCs HR 79 - Personally Reviewed  Physical Exam   GEN: No acute distress.   Neck: No JVD Cardiac: irregular (PVCs) no murmurs, rubs, or gallops.  Respiratory: Clear to auscultation bilaterally. GI: Soft, nontender, non-distended  MS: No edema;  No deformity. Neuro:  Nonfocal  Psych: Normal affect   Labs    High Sensitivity Troponin:   Recent Labs  Lab 01/28/23 1832 01/28/23 2125 02/03/23 1901 02/03/23 2238 02/04/23 0020  TROPONINIHS 8 8 9 9 9      Chemistry Recent Labs  Lab 02/02/23 1213 02/03/23 1901 02/03/23 2238 02/04/23 0020  NA 138 136  --  137  K 4.7 3.8  --  3.8  CL 100 105  --  105  CO2 24 22  --  23  GLUCOSE 108* 119*  --  109*  BUN 18 18  --  17  CREATININE 0.88 0.73 0.81 0.78  CALCIUM 9.7 8.8*  --  8.9  MG  --   --   --  2.0  GFRNONAA  --  >60 >60 >60  ANIONGAP  --  9  --  9     Hematology Recent Labs  Lab 02/03/23 1901 02/03/23 2238 02/04/23 0020 02/04/23 2155  WBC 7.0 6.5 6.7  --   RBC 3.76* 3.67* 3.65*  --   HGB 8.7* 8.4* 8.6*  9.8*  HCT 28.5* 27.6* 28.0* 31.1*  MCV 75.8* 75.2* 76.7*  --   MCH 23.1* 22.9* 23.6*  --   MCHC 30.5 30.4 30.7  --   RDW 16.4* 16.5* 16.4*  --   PLT 356 352 348  --     Radiology    ECHOCARDIOGRAM COMPLETE  Result Date: 02/04/2023    ECHOCARDIOGRAM REPORT   Patient Name:   Rachel Clark Date of Exam: 02/04/2023 Medical Rec #:  [id number]         Height:       68.0 in Accession #:    [id number]        Weight:       181.8 lb Date of Birth:  February 26, 1951        BSA:          1.963 m Patient Age:    71 years          BP:           131/60 mmHg Patient Gender: F                 HR:           85 bpm. Exam Location:  Inpatient Procedure: 2D Echo, Cardiac Doppler and Color Doppler Indications:    Chest pain R07.9  History:        Patient has prior history of Echocardiogram examinations, most                 recent 11/12/2021. CAD and Previous Myocardial Infarction, Prior                 CABG and Pacemaker; Risk Factors:Hypertension, Dyslipidemia and                 Former Smoker.  Sonographer:    Dondra Prader RVT RCS Referring Phys: [id number] Elmon Kirschner IMPRESSIONS  1. Left ventricular ejection fraction, by estimation, is 50 to 55%. The left ventricle has low normal  function. The left ventricle demonstrates regional wall motion abnormalities (see scoring diagram/findings for description). Left ventricular diastolic  parameters are consistent with Grade I diastolic dysfunction (impaired relaxation).  2. Right ventricular systolic function is normal. The right ventricular size is normal. Tricuspid regurgitation signal is inadequate for assessing PA pressure.  3. The mitral valve is normal in structure. Mild to moderate mitral valve regurgitation. No evidence of mitral stenosis.  4. The aortic valve is tricuspid. Aortic valve regurgitation is not visualized. No aortic stenosis is present.  5. The inferior vena cava is normal in size with greater than 50% respiratory variability, suggesting right atrial pressure of 3 mmHg. FINDINGS  Left Ventricle: Left ventricular ejection fraction, by estimation, is 50 to 55%. The left ventricle has low normal function. The left ventricle demonstrates regional wall motion abnormalities. The left ventricular internal cavity size was normal in size. There is no left ventricular hypertrophy. Left ventricular diastolic parameters are consistent with Grade I diastolic dysfunction (impaired relaxation).  LV Wall Scoring: The basal inferior segment is akinetic. Right Ventricle: The right ventricular size is normal. No increase in right ventricular wall thickness. Right ventricular systolic function is normal. Tricuspid regurgitation signal is inadequate for assessing PA pressure. Left Atrium: Left atrial size was normal in size. Right Atrium: Right atrial size was normal in size. Pericardium: Trivial pericardial effusion is present. The pericardial effusion is circumferential. Mitral Valve: The mitral valve is normal in structure. Mild to moderate mitral valve regurgitation. No evidence of mitral  valve stenosis. Tricuspid Valve: The tricuspid valve is not well visualized. Tricuspid valve regurgitation is not demonstrated. No evidence of tricuspid  stenosis. Aortic Valve: The aortic valve is tricuspid. Aortic valve regurgitation is not visualized. No aortic stenosis is present. Aortic valve mean gradient measures 3.2 mmHg. Aortic valve peak gradient measures 6.0 mmHg. Aortic valve area, by VTI measures 3.59 cm. Pulmonic Valve: The pulmonic valve was normal in structure. Pulmonic valve regurgitation is trivial. No evidence of pulmonic stenosis. Aorta: The aortic root is normal in size and structure. Venous: The inferior vena cava is normal in size with greater than 50% respiratory variability, suggesting right atrial pressure of 3 mmHg. IAS/Shunts: No atrial level shunt detected by color flow Doppler.  LEFT VENTRICLE PLAX 2D LVIDd:         4.90 cm   Diastology LVIDs:         3.90 cm   LV e' medial:    9.57 cm/s LV PW:         1.10 cm   LV E/e' medial:  10.0 LV IVS:        1.10 cm   LV e' lateral:   12.50 cm/s LVOT diam:     2.50 cm   LV E/e' lateral: 7.6 LV SV:         76 LV SV Index:   39 LVOT Area:     4.91 cm  RIGHT VENTRICLE             IVC RV S prime:     10.40 cm/s  IVC diam: 1.50 cm TAPSE (M-mode): 2.5 cm LEFT ATRIUM             Index        RIGHT ATRIUM           Index LA diam:        4.10 cm 2.09 cm/m   RA Area:     12.10 cm LA Vol (A2C):   58.6 ml 29.83 ml/m  RA Volume:   29.30 ml  14.93 ml/m LA Vol (A4C):   49.9 ml 25.40 ml/m LA Biplane Vol: 57.8 ml 29.45 ml/m  AORTIC VALVE                    PULMONIC VALVE AV Area (Vmax):    3.64 cm     PV Vmax:       1.02 m/s AV Area (Vmean):   3.49 cm     PV Peak grad:  4.2 mmHg AV Area (VTI):     3.59 cm AV Vmax:           122.25 cm/s AV Vmean:          81.650 cm/s AV VTI:            0.211 m AV Peak Grad:      6.0 mmHg AV Mean Grad:      3.2 mmHg LVOT Vmax:         90.77 cm/s LVOT Vmean:        58.133 cm/s LVOT VTI:          0.154 m LVOT/AV VTI ratio: 0.73  AORTA Ao Asc diam: 3.40 cm MITRAL VALVE MV Area (PHT): 4.55 cm    SHUNTS MV Decel Time: 167 msec    Systemic VTI:  0.15 m MV E velocity: 95.33  cm/s  Systemic Diam: 2.50 cm MV A velocity: 82.37 cm/s MV E/A ratio:  1.16 Vishnu Priya Mallipeddi Electronically signed  by Winfield Rast Mallipeddi Signature Date/Time: 02/04/2023/3:48:59 PM    Final    DG Chest Portable 1 View  Result Date: 02/03/2023 CLINICAL DATA:  Chest pain EXAM: PORTABLE CHEST 1 VIEW COMPARISON:  01/28/2023 FINDINGS: Prior CABG. Left pacer remains in place, unchanged. Heart and mediastinal contours within normal limits. No confluent airspace opacities or effusions. No acute bony abnormality. IMPRESSION: No active disease. Electronically Signed   By: Charlett Nose M.D.   On: 02/03/2023 18:50    Cardiac Studies   Echo 02/04/23 IMPRESSIONS    1. Left ventricular ejection fraction, by estimation, is 50 to 55%. The  left ventricle has low normal function. The left ventricle demonstrates  regional wall motion abnormalities (see scoring diagram/findings for  description). Left ventricular diastolic   parameters are consistent with Grade I diastolic dysfunction (impaired  relaxation).   2. Right ventricular systolic function is normal. The right ventricular  size is normal. Tricuspid regurgitation signal is inadequate for assessing  PA pressure.   3. The mitral valve is normal in structure. Mild to moderate mitral valve  regurgitation. No evidence of mitral stenosis.   4. The aortic valve is tricuspid. Aortic valve regurgitation is not  visualized. No aortic stenosis is present.   5. The inferior vena cava is normal in size with greater than 50%  respiratory variability, suggesting right atrial pressure of 3 mmHg.    Patient Profile     72 y.o. female with CAD (remote NSTEMI s/p PCI to RCA, s/p CABG 2007, subsequent failure of SVG-RCA by cath in 2016, DESx3 - full metal jacket- to RCA in 11/2015, NSTEMI 03/2021 with DESx3 to ramus intermedius), NSTEMI  CHB/syncope s/p PPM in 03/2015, recurrent syncope/dizziness then felt due to orthostasis, GIB 03/2021 s/p transfusion with gastric  antrum vessel s/p epi/clipping, HL with statin intolerance, colon CA s/p resection and chemotherapy, seizures, memory loss, chronic hyponatremia, fibromyalgia, GERD, hypothyroidism, former smoker  , NSTEMI 10/2021- s/p complex PCI to LCX. who was seen 02/03/2023 for the evaluation of chest pain.    Assessment & Plan    Chest Pain  -- presented with chest pain for the last 2 weeks unrelieved by nitroglycerin -- question anemia/demand vs ischemic -- Chest pain resolved with blood transfusion yesterday  Iron deficiency anemia -- Hx of GI bleed with gastric clipping and Colon CA s/p resection -- Ferratin 11, Iron 7.  -- Hgb 9.4 on admission, dropped as low as 8.4. Received 1 unit PRBCs yesterday. Hgb 9.5 this AM  -- Give IV iron  -- will consult GI  CAD -- Extensive cardiac history s/p CABG, full jacket RCA pci, DESx3 to ramus, pci to LCX -- Troponins neg, No EKG changes -- LHC today    For questions or updates, please contact [facility] Please consult [email or web address] for contact info under     Signed, Osborne Oman, RN Student Nurse Practitioner 02/05/2023, 7:57 AM     Agree with note by Deniece Ree, RN/NP student  Ms. Beld is a patient of Dr. Elvis Coil.  She is a history of remote CABG and stenting of her RCA and high obtuse marginal branch.  She had difficult procedure performed by Dr. 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  She has had AVMs clipped in the past.  She came in with constant chest pain.  She was profoundly anemic and was transfused.  Her symptoms resolved after transfusion.  Her enzymes were negative and she had no acute  EKG changes.  2D echo was normal.  Her symptoms do not sound anginal but in order to facilitate a GI workup I have recommended that she undergo left heart cath to define her anatomy.  Runell Gess, M.D., FACP, [facility], [facility] [address] [place], [place]  [address]  [phone] 02/05/2023 9:59 AM

## 2023-02-05 NOTE — Progress Notes (Addendum)
Updated pt and family on plan for stress test tomorrow instead of LHC today due to low suspicion for ischemic chest pain. NPO at midnight. All questions were answered.

## 2023-02-06 ENCOUNTER — Inpatient Hospital Stay (HOSPITAL_COMMUNITY): Payer: Medicare Other

## 2023-02-06 DIAGNOSIS — R0789 Other chest pain: Secondary | ICD-10-CM | POA: Diagnosis not present

## 2023-02-06 DIAGNOSIS — R079 Chest pain, unspecified: Secondary | ICD-10-CM

## 2023-02-06 MED ORDER — ONDANSETRON HCL 4 MG/2ML IJ SOLN
INTRAMUSCULAR | Status: AC
  Filled 2023-02-06: qty 2

## 2023-02-06 MED ORDER — TECHNETIUM TC 99M TETROFOSMIN IV KIT
10.7000 | PACK | Freq: Once | INTRAVENOUS | Status: AC | PRN
  Administered 2023-02-06: 10.7 via INTRAVENOUS

## 2023-02-06 MED ORDER — REGADENOSON 0.4 MG/5ML IV SOLN
0.4000 mg | Freq: Once | INTRAVENOUS | Status: AC
  Administered 2023-02-06: 0.4 mg via INTRAVENOUS
  Filled 2023-02-06: qty 5

## 2023-02-06 MED ORDER — ONDANSETRON HCL 4 MG/2ML IJ SOLN
4.0000 mg | Freq: Once | INTRAMUSCULAR | Status: AC
  Administered 2023-02-06: 4 mg via INTRAVENOUS

## 2023-02-06 MED ORDER — TECHNETIUM TC 99M TETROFOSMIN IV KIT
32.0000 | PACK | Freq: Once | INTRAVENOUS | Status: AC | PRN
  Administered 2023-02-06: 32 via INTRAVENOUS

## 2023-02-06 MED ORDER — REGADENOSON 0.4 MG/5ML IV SOLN
INTRAVENOUS | Status: AC
  Filled 2023-02-06: qty 5

## 2023-02-06 NOTE — Consult Note (Signed)
Referring Provider: TH Primary Care Physician:  Laurann Montana, MD Primary Gastroenterologist: [facility]  Reason for Consultation: Anemia, chest pain,  HPI: Rachel Clark is a 72 y.o. female with past medical history of coronary disease currently on aspirin and Plavix with last PCI in December 2023, history of gastric ulcer requiring endoscopic treatment in 2022 presented to the hospital with chest pain.  There was initial plan for left heart cath but her chest pain improved with blood transfusion.  She subsequently underwent stress test today.  GI is consulted for further evaluation of anemia.  Patient's blood work relatively showed stable anemia.  Her hemoglobin was 8.4 on February 03, 2023.  Which was 1 g less than hemoglobin of 9.4 in end of July 2024.  Patient seen and examined at bedside.  She denies seeing any blood in the stool or black stool.  She denies any abdominal pain, nausea or vomiting.  Complaining of intermittent chest pain.  Last EGD in May 2023 showed small clean-based prepyloric gastric ulcer without have any evidence of active bleeding and gastritis.  Patient with history of colon cancer status post transverse colectomy in 2013, last colonoscopy in 2019 showed multiple tubular adenomas as well as sessile serrated adenoma and repeat was recommended in 3 years.  Patient was scheduled for repeat colonoscopy in July 2024 which was subsequently canceled because of underlying cardiac issues.  Past Medical History:  Diagnosis Date   Arthritis    "fingers" (06/29/2016)   Chronic lower back pain    Colon cancer (HCC) 12/04/2011   s/p Laparoscopic-assisted transverse colectomy on 12/19/2011 by Dr. Dwain Sarna.  pT3 N0 M0.    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   last seizure was in 1972 ;takes  Tegretol (06/29/2016)    Headache    "weekly" (06/29/2016)   Hyperlipidemia    Hyponatremia    Hypothyroid    Iron deficiency anemia 11/17/2011   Memory change 11/13/2016   Monoclonal gammopathy of unknown significance    Orthostatic hypotension    Presence of permanent cardiac pacemaker    Syncope and collapse    pacemaker implanted    Past Surgical History:  Procedure Laterality Date   ANTERIOR CERVICAL DECOMP/DISCECTOMY FUSION  1980's   BACK SURGERY     CARDIAC CATHETERIZATION  05/20/2009   obstructive native vessel disease in LAD, RCA, and first diagonal, patent vein graft to distal RCA and LIMA to LAD,normal. ef 60%   CARDIAC CATHETERIZATION N/A 03/24/2015   Procedure: Left Heart Cath and Cors/Grafts Angiography;  Surgeon: Iran Ouch, MD;  Location: [facility] INVASIVE CV LAB;  Service: Cardiovascular;  Laterality: N/A;   CARDIAC CATHETERIZATION N/A 07/28/2015   Procedure: Left Heart Cath and Coronary Angiography;  Surgeon: Lennette Bihari, MD;  Location: [facility];  Service: Cardiovascular;  Laterality: N/A;   CARDIAC CATHETERIZATION N/A 11/09/2015   Procedure: Coronary Stent Intervention;  Surgeon: Tonny Bollman, MD;  Location: [facility] INVASIVE CV LAB;  Service: Cardiovascular;  Laterality: N/A;   CARDIAC CATHETERIZATION N/A 11/09/2015   Procedure: Left Heart Cath and Coronary Angiography;  Surgeon: Tonny Bollman, MD;  Location: [facility] INVASIVE CV LAB;  Service: Cardiovascular;  Laterality: N/A;   CARDIAC CATHETERIZATION N/A 06/29/2016   Procedure: Left Heart Cath and  Coronary Angiography;  Surgeon: Yvonne Kendall, MD;  Location: [facility] INVASIVE CV LAB;  Service: Cardiovascular;  Laterality: N/A;   CARDIAC CATHETERIZATION N/A 06/29/2016   Procedure: Intravascular Pressure Wire/FFR Study;  Surgeon: Yvonne Kendall, MD;  Location: [facility] INVASIVE CV LAB;  Service: Cardiovascular;  Laterality: N/A;   CARDIAC CATHETERIZATION N/A 06/29/2016   Procedure: Coronary Balloon Angioplasty;  Surgeon:  Yvonne Kendall, MD;  Location: [facility];  Service: Cardiovascular;  Laterality: N/A;   COLON RESECTION  12/19/2011   Procedure: COLON RESECTION LAPAROSCOPIC;  Surgeon: Emelia Loron, MD;  Location: [facility] OR;  Service: General;  Laterality: N/A;  laparoscopic hand assisted partial colon resection   COLON SURGERY     COLONOSCOPY     CORONARY ANGIOPLASTY WITH STENT PLACEMENT  ~ 2007   1 stent   CORONARY ARTERY BYPASS GRAFT  2007   "CABG X2"   DILATION AND CURETTAGE OF UTERUS  1973   EP IMPLANTABLE DEVICE N/A 03/25/2015   MDT Advisa DR pacemaker implanted by Dr Johney Frame for transient complete heart block and syncope   ESOPHAGOGASTRODUODENOSCOPY (EGD) WITH PROPOFOL N/A 03/14/2021   Procedure: ESOPHAGOGASTRODUODENOSCOPY (EGD) WITH PROPOFOL;  Surgeon: Kerin Salen, MD;  Location: [facility] ENDOSCOPY;  Service: Gastroenterology;  Laterality: N/A;   ESOPHAGOGASTRODUODENOSCOPY (EGD) WITH PROPOFOL N/A 11/13/2021   Procedure: ESOPHAGOGASTRODUODENOSCOPY (EGD) WITH PROPOFOL;  Surgeon: Kathi Der, MD;  Location: [facility] ENDOSCOPY;  Service: Gastroenterology;  Laterality: N/A;   HEMOSTASIS CLIP PLACEMENT  03/14/2021   Procedure: HEMOSTASIS CLIP PLACEMENT;  Surgeon: Kerin Salen, MD;  Location: [facility] ENDOSCOPY;  Service: Gastroenterology;;   INSERT / REPLACE / REMOVE PACEMAKER     LEFT HEART CATH AND CORONARY ANGIOGRAPHY N/A 11/11/2021   Procedure: LEFT HEART CATH AND CORONARY ANGIOGRAPHY;  Surgeon: Marykay Lex, MD;  Location: [facility] INVASIVE CV LAB;  Service: Cardiovascular;  Laterality: N/A;   LEFT HEART CATH AND CORS/GRAFTS ANGIOGRAPHY N/A 02/21/2018   Procedure: LEFT HEART CATH AND CORS/GRAFTS ANGIOGRAPHY;  Surgeon: Runell Gess, MD;  Location: [facility] INVASIVE CV LAB;  Service: Cardiovascular;  Laterality: N/A;   LEFT HEART CATH AND CORS/GRAFTS ANGIOGRAPHY N/A 03/08/2021   Procedure: LEFT HEART CATH AND CORS/GRAFTS ANGIOGRAPHY;  Surgeon: Swaziland, Peter M, MD;  Location: [facility] INVASIVE CV LAB;  Service:  Cardiovascular;  Laterality: N/A;   PORT-A-CATH REMOVAL N/A 06/12/2013   Procedure: REMOVAL PORT-A-CATH;  Surgeon: Emelia Loron, MD;  Location: [facility];  Service: General;  Laterality: N/A;   PORTACATH PLACEMENT  06/04/2012   Procedure: INSERTION PORT-A-CATH;  Surgeon: Emelia Loron, MD;  Location: [facility] OR;  Service: General;  Laterality: N/A;  Insertion of port-a-cath    POSTERIOR LAMINECTOMY / DECOMPRESSION CERVICAL SPINE  1990s   SCLEROTHERAPY  03/14/2021   Procedure: SCLEROTHERAPY;  Surgeon: Kerin Salen, MD;  Location: [facility] ENDOSCOPY;  Service: Gastroenterology;;   VAGINAL HYSTERECTOMY      Prior to Admission medications   Medication Sig Start Date End Date Taking? Authorizing Provider  acetaminophen (TYLENOL) 500 MG tablet Take 500-1,000 mg by mouth daily as needed for mild pain or headache.   Yes [provider]  aspirin 81 MG chewable tablet Chew 1 tablet (81 mg total) by mouth daily. 06/30/16  Yes Little Ishikawa, NP  Azelastine HCl 0.15 % SOLN Place 1 spray into both nostrils daily as needed (seasonal allergies). 12/22/14  Yes [provider]  Bempedoic Acid-Ezetimibe (NEXLIZET) 180-10 MG TABS Take 180 mg by mouth daily. 04/25/22  Yes [place], Peter M, MD  calcium carbonate (TUMS - DOSED  IN MG ELEMENTAL CALCIUM) 500 MG chewable tablet Chew 500 mg by mouth daily.   Yes [provider]  carbamazepine (TEGRETOL XR) 200 MG 12 hr tablet Take 3 tablets (600 mg total) by mouth 2 (two) times daily. 09/19/22 12/13/23 Yes Glean Salvo, NP  Cholecalciferol (VITAMIN D) 50 MCG (2000 UT) CAPS Take 4,000 Units by mouth daily.   Yes [provider]  diphenhydrAMINE (BENADRYL) 25 MG tablet Take 25 mg by mouth daily as needed for allergies.   Yes [provider]  diphenhydramine-acetaminophen (TYLENOL PM) 25-500 MG TABS tablet Take 1 tablet by mouth at bedtime as needed (Sleep).   Yes [provider]  DULoxetine (CYMBALTA) 60 MG  capsule Take 60 mg by mouth 2 (two) times daily.   Yes [provider]  ethosuximide (ZARONTIN) 250 MG capsule TAKE 2 CAPSULES IN THE MORNING, TAKE 2 CAPSULES AT NIGHT Patient taking differently: Take 500 mg by mouth See admin instructions. Take 2 capsules in the morning and then take 2 capsules by mouth at night 07/11/22  Yes Glean Salvo, NP  Evolocumab (REPATHA SURECLICK) 140 MG/ML SOAJ Inject 140 mg into the skin every 14 (fourteen) days. 12/25/22  Yes [place], Peter M, MD  ferrous sulfate 325 (65 FE) MG EC tablet Take 325 mg by mouth daily with breakfast.   Yes [provider]  fluticasone (FLONASE) 50 MCG/ACT nasal spray Place 1 spray into both nostrils daily as needed for allergies.   Yes [provider]  isosorbide mononitrate (IMDUR) 30 MG 24 hr tablet TAKE HALF TABLET (15MG  TOTAL) BY MOUTH EVERY DAY Patient taking differently: Take 15 mg by mouth daily. 04/20/22  Yes [place], Peter M, MD  levothyroxine (SYNTHROID, LEVOTHROID) 50 MCG tablet Take 50 mcg by mouth daily before breakfast.   Yes [provider]  memantine (NAMENDA) 10 MG tablet Take 1 tablet (10 mg total) by mouth 2 (two) times daily. 09/14/22  Yes Glean Salvo, NP  nitroGLYCERIN (NITROSTAT) 0.4 MG SL tablet place 1 tablet under the tongue every 5 minutes as needed for chest pain Patient taking differently: Place 0.4 mg under the tongue every 5 (five) minutes as needed for chest pain. 01/24/23  Yes [place], Peter M, MD  Oral Electrolytes (THERMOTABS PO) Take 452 mg by mouth daily. Thermotabs 452 mg sodium chloride   Yes [provider]  pantoprazole (PROTONIX) 40 MG tablet TAKE ONE TABLET BY MOUTH TWICE DAILY FOR 30 DAYS THEN TAKE ONE TABLET BY MOUTH DAILY Patient taking differently: Take 40 mg by mouth daily. 02/08/22  Yes [place], Peter M, MD  prasugrel (EFFIENT) 10 MG TABS tablet Take 1 tablet (10 mg total) by mouth daily. 04/25/22  Yes [place], Peter M, MD  ranolazine (RANEXA) 500 MG 12 hr  tablet Take 1 tablet (500 mg total) by mouth 2 (two) times daily. 02/02/23  Yes Duke Salvia, MD    Scheduled Meds:  aspirin  81 mg Oral Daily   Bempedoic Acid-Ezetimibe   Oral Daily   calcium carbonate  500 mg Oral Daily   carbamazepine  600 mg Oral BID   cholecalciferol  4,000 Units Oral Daily   DULoxetine  60 mg Oral BID   ethosuximide  500 mg Oral BID   ferrous sulfate  325 mg Oral Q breakfast   heparin  5,000 Units Subcutaneous Q8H   isosorbide mononitrate  30 mg Oral Daily   levothyroxine  50 mcg Oral QAC breakfast   memantine  10 mg Oral  BID   pantoprazole (PROTONIX) IV  40 mg Intravenous Q12H   prasugrel  10 mg Oral Daily   ranolazine  1,000 mg Oral BID   Continuous Infusions:  iron sucrose 68.8 mL/hr at 02/05/23 1507   PRN Meds:.acetaminophen, fluticasone, ondansetron (ZOFRAN) IV  Allergies as of 02/03/2023 - Review Complete 02/03/2023  Allergen Reaction Noted   Sulfamethoxazole Anaphylaxis 11/10/2020   Aspirin Other (See Comments) 12/30/2010   Crestor [rosuvastatin]  12/28/2020   Erythromycin Nausea Only 12/23/2010   Lisinopril Cough 12/23/2010   Niacin and related Other (See Comments) 12/30/2010   Penicillin g benzathine  09/06/2017   Statins Other (See Comments) 12/23/2010   Sulfa drugs cross reactors Swelling 12/23/2010   Tetracyclines & related Nausea Only 12/23/2010   Xeloda [capecitabine] Diarrhea 11/10/2020   Penicillins Nausea Only and Rash 12/23/2010   Plavix [clopidogrel bisulfate] Rash 02/20/2018    Family History  Problem Relation Age of Onset   Heart attack Father    Heart disease Father    Heart attack Brother 93   Cancer Mother        lung   Stroke Neg Hx     Social History   Socioeconomic History   Marital status: Married    Spouse name: Not on file   Number of children: 2   Years of education: 12   Highest education level: Not on file  Occupational History   Occupation: Retired  Tobacco Use   Smoking status: Former    Current  packs/day: 0.00    Average packs/day: 0.3 packs/day for 20.0 years (6.0 ttl pk-yrs)    Types: Cigarettes    Start date: 06/06/1961    Quit date: 06/06/1981    Years since quitting: 41.6   Smokeless tobacco: Never  Vaping Use   Vaping status: Never Used  Substance and Sexual Activity   Alcohol use: Yes    Alcohol/week: 1.0 standard drink of alcohol    Types: 1 Glasses of wine per week    Comment: occ   Drug use: No   Sexual activity: Not Currently    Birth control/protection: Surgical  Other Topics Concern   Not on file  Social History Narrative   Patient is married with 2 children.   Patient is right handed.   Patient has a high school education with some college education.   Patient drinks 2 cups daily.   Social Determinants of Health   Financial Resource Strain: Not on file  Food Insecurity: No Food Insecurity (02/03/2023)   Hunger Vital Sign    Worried About Running Out of Food in the Last Year: Never true    Ran Out of Food in the Last Year: Never true  Transportation Needs: No Transportation Needs (02/03/2023)   PRAPARE - Administrator, Civil Service (Medical): No    Lack of Transportation (Non-Medical): No  Physical Activity: Not on file  Stress: Not on file  Social Connections: Not on file  Intimate Partner Violence: Not At Risk (02/03/2023)   Humiliation, Afraid, Rape, and Kick questionnaire    Fear of Current or Ex-Partner: No    Emotionally Abused: No    Physically Abused: No    Sexually Abused: No    Review of Systems: All negative except as stated above in HPI.  Physical Exam: Vital signs: Vitals:   02/06/23 1035 02/06/23 1038  BP: (!) 132/53 129/77  Pulse: (!) 103 96  Resp:    Temp:    SpO2:  Last BM Date : 02/05/23 General:   Alert,  Well-developed, well-nourished, pleasant and cooperative in NAD Lungs: No visible respiratory distress Heart: Tachycardia Abdomen: Soft, nontender, nondistended, bowel sound present, no peritoneal  signs Rectal:  Deferred  GI:  Lab Results: Recent Labs    02/04/23 0020 02/04/23 2155 02/05/23 0700 02/06/23 0304  WBC 6.7  --  6.4 8.2  HGB 8.6* 9.8* 9.5* 9.5*  HCT 28.0* 31.1* 29.6* 29.6*  PLT 348  --  332 335   BMET Recent Labs    02/04/23 0020 02/05/23 0700 02/06/23 0304  NA 137 132* 130*  K 3.8 3.8 3.7  CL 105 102 99  CO2 23 24 23   GLUCOSE 109* 103* 98  BUN 17 14 14   CREATININE 0.78 1.18* 0.88  CALCIUM 8.9 8.2* 8.2*   LFT No results for input(s): "PROT", "ALBUMIN", "AST", "ALT", "ALKPHOS", "BILITOT", "BILIDIR", "IBILI" in the last 72 hours. PT/INR No results for input(s): "LABPROT", "INR" in the last 72 hours.   Studies/Results: ECHOCARDIOGRAM COMPLETE  Result Date: 02/04/2023    ECHOCARDIOGRAM REPORT   Patient Name:   Rachel Clark Date of Exam: 02/04/2023 Medical Rec #:  [id number]         Height:       68.0 in Accession #:    [id number]        Weight:       181.8 lb Date of Birth:  [birth year]        BSA:          1.963 m Patient Age:    71 years          BP:           131/60 mmHg Patient Gender: F                 HR:           85 bpm. Exam Location:  Inpatient Procedure: 2D Echo, Cardiac Doppler and Color Doppler Indications:    Chest pain R07.9  History:        Patient has prior history of Echocardiogram examinations, most                 recent 11/12/2021. CAD and Previous Myocardial Infarction, Prior                 CABG and Pacemaker; Risk Factors:Hypertension, Dyslipidemia and                 Former Smoker.  Sonographer:    Dondra Prader RVT RCS Referring Phys: [id number] Elmon Kirschner IMPRESSIONS  1. Left ventricular ejection fraction, by estimation, is 50 to 55%. The left ventricle has low normal function. The left ventricle demonstrates regional wall motion abnormalities (see scoring diagram/findings for description). Left ventricular diastolic  parameters are consistent with Grade I diastolic dysfunction (impaired relaxation).  2. Right ventricular systolic  function is normal. The right ventricular size is normal. Tricuspid regurgitation signal is inadequate for assessing PA pressure.  3. The mitral valve is normal in structure. Mild to moderate mitral valve regurgitation. No evidence of mitral stenosis.  4. The aortic valve is tricuspid. Aortic valve regurgitation is not visualized. No aortic stenosis is present.  5. The inferior vena cava is normal in size with greater than 50% respiratory variability, suggesting right atrial pressure of 3 mmHg. FINDINGS  Left Ventricle: Left ventricular ejection fraction, by estimation, is 50 to 55%. The left ventricle has low normal function. The left ventricle demonstrates regional wall  motion abnormalities. The left ventricular internal cavity size was normal in size. There is no left ventricular hypertrophy. Left ventricular diastolic parameters are consistent with Grade I diastolic dysfunction (impaired relaxation).  LV Wall Scoring: The basal inferior segment is akinetic. Right Ventricle: The right ventricular size is normal. No increase in right ventricular wall thickness. Right ventricular systolic function is normal. Tricuspid regurgitation signal is inadequate for assessing PA pressure. Left Atrium: Left atrial size was normal in size. Right Atrium: Right atrial size was normal in size. Pericardium: Trivial pericardial effusion is present. The pericardial effusion is circumferential. Mitral Valve: The mitral valve is normal in structure. Mild to moderate mitral valve regurgitation. No evidence of mitral valve stenosis. Tricuspid Valve: The tricuspid valve is not well visualized. Tricuspid valve regurgitation is not demonstrated. No evidence of tricuspid stenosis. Aortic Valve: The aortic valve is tricuspid. Aortic valve regurgitation is not visualized. No aortic stenosis is present. Aortic valve mean gradient measures 3.2 mmHg. Aortic valve peak gradient measures 6.0 mmHg. Aortic valve area, by VTI measures 3.59 cm.  Pulmonic Valve: The pulmonic valve was normal in structure. Pulmonic valve regurgitation is trivial. No evidence of pulmonic stenosis. Aorta: The aortic root is normal in size and structure. Venous: The inferior vena cava is normal in size with greater than 50% respiratory variability, suggesting right atrial pressure of 3 mmHg. IAS/Shunts: No atrial level shunt detected by color flow Doppler.  LEFT VENTRICLE PLAX 2D LVIDd:         4.90 cm   Diastology LVIDs:         3.90 cm   LV e' medial:    9.57 cm/s LV PW:         1.10 cm   LV E/e' medial:  10.0 LV IVS:        1.10 cm   LV e' lateral:   12.50 cm/s LVOT diam:     2.50 cm   LV E/e' lateral: 7.6 LV SV:         76 LV SV Index:   39 LVOT Area:     4.91 cm  RIGHT VENTRICLE             IVC RV S prime:     10.40 cm/s  IVC diam: 1.50 cm TAPSE (M-mode): 2.5 cm LEFT ATRIUM             Index        RIGHT ATRIUM           Index LA diam:        4.10 cm 2.09 cm/m   RA Area:     12.10 cm LA Vol (A2C):   58.6 ml 29.83 ml/m  RA Volume:   29.30 ml  14.93 ml/m LA Vol (A4C):   49.9 ml 25.40 ml/m LA Biplane Vol: 57.8 ml 29.45 ml/m  AORTIC VALVE                    PULMONIC VALVE AV Area (Vmax):    3.64 cm     PV Vmax:       1.02 m/s AV Area (Vmean):   3.49 cm     PV Peak grad:  4.2 mmHg AV Area (VTI):     3.59 cm AV Vmax:           122.25 cm/s AV Vmean:          81.650 cm/s AV VTI:  0.211 m AV Peak Grad:      6.0 mmHg AV Mean Grad:      3.2 mmHg LVOT Vmax:         90.77 cm/s LVOT Vmean:        58.133 cm/s LVOT VTI:          0.154 m LVOT/AV VTI ratio: 0.73  AORTA Ao Asc diam: 3.40 cm MITRAL VALVE MV Area (PHT): 4.55 cm    SHUNTS MV Decel Time: 167 msec    Systemic VTI:  0.15 m MV E velocity: 95.33 cm/s  Systemic Diam: 2.50 cm MV A velocity: 82.37 cm/s MV E/A ratio:  1.16 Vishnu Priya Mallipeddi Electronically signed by Winfield Rast Mallipeddi Signature Date/Time: 02/04/2023/3:48:59 PM    Final     Impression/Plan: -Acute on chronic anemia in a patient with history  of gastric ulcer.  Patient denies any overt bleeding. -H/O  coronary artery disease, currently on aspirin and Effient. -Chest pain.  Patient with extensive coronary artery disease.  Underwent stress test today. -History of colon cancer.  Status post transverse colon resection in 2013.  Last colonoscopy was in 2019 and she is overdue for her colonoscopy.  Recommendations --------------------------- -Patient does not have any overt bleeding but she has chronic anemia.  Given her history of ulcer disease, I think it is reasonable to start with EGD first for evaluation of anemia. -Stress test negative for reversible ischemia today.  If okay with cardiology, will plan for EGD tomorrow afternoon. -Continue IV twice daily PPI.  Keep n.p.o. past midnight.  Risks (bleeding, infection, bowel perforation that could require surgery, sedation-related changes in cardiopulmonary systems), benefits (identification and possible treatment of source of symptoms, exclusion of certain causes of symptoms), and alternatives (watchful waiting, radiographic imaging studies, empiric medical treatment)  were explained to patient/family in detail and patient wishes to proceed.    LOS: 3 days   Kathi Der  MD, FACP 02/06/2023, 11:57 AM  Contact #  [phone]

## 2023-02-06 NOTE — Progress Notes (Addendum)
Rounding Note    Patient Name: Rachel Clark Date of Encounter: 02/06/2023  [facility] Cardiologist: Peter Swaziland, MD   Subjective   No complaints, seen in [facility]  Inpatient Medications    Scheduled Meds:  aspirin  81 mg Oral Daily   Bempedoic Acid-Ezetimibe   Oral Daily   calcium carbonate  500 mg Oral Daily   carbamazepine  600 mg Oral BID   cholecalciferol  4,000 Units Oral Daily   DULoxetine  60 mg Oral BID   ethosuximide  500 mg Oral BID   ferrous sulfate  325 mg Oral Q breakfast   heparin  5,000 Units Subcutaneous Q8H   isosorbide mononitrate  30 mg Oral Daily   levothyroxine  50 mcg Oral QAC breakfast   memantine  10 mg Oral BID   pantoprazole (PROTONIX) IV  40 mg Intravenous Q12H   prasugrel  10 mg Oral Daily   ranolazine  1,000 mg Oral BID   Continuous Infusions:  iron sucrose 68.8 mL/hr at 02/05/23 1507   PRN Meds: acetaminophen, fluticasone, ondansetron (ZOFRAN) IV   Vital Signs    Vitals:   02/05/23 0757 02/05/23 1606 02/05/23 1959 02/06/23 0349  BP: (!) 157/80 (!) 150/79 138/71 139/71  Pulse: 87 82 81 79  Resp: 18 16 18 18   Temp: (!) 97.5 F (36.4 C) 97.9 F (36.6 C) 98 F (36.7 C) 97.9 F (36.6 C)  TempSrc: Oral Oral Oral Oral  SpO2: 94% 98% 98% 97%  Weight:      Height:        Intake/Output Summary (Last 24 hours) at 02/06/2023 0730 Last data filed at 02/05/2023 1507 Gross per 24 hour  Intake 668.03 ml  Output --  Net 668.03 ml      02/03/2023   10:02 PM 02/03/2023    7:02 PM 02/02/2023   10:29 AM  Last 3 Weights  Weight (lbs) 181 lb 12.8 oz 182 lb 182 lb  Weight (kg) 82.464 kg 82.555 kg 82.555 kg      Telemetry    NSR HR 70s, Frequent PVCs - Personally Reviewed  ECG    No new tracing   Physical Exam   GEN: No acute distress.   Cardiac: RRR, no murmurs, rubs, or gallops.  Respiratory: Clear to auscultation bilaterally. MS: No edema; No deformity. Neuro:  Nonfocal  Psych: Normal affect   Labs    High  Sensitivity Troponin:   Recent Labs  Lab 01/28/23 1832 01/28/23 2125 02/03/23 1901 02/03/23 2238 02/04/23 0020  TROPONINIHS 8 8 9 9 9      Chemistry Recent Labs  Lab 02/04/23 0020 02/05/23 0700 02/06/23 0304  NA 137 132* 130*  K 3.8 3.8 3.7  CL 105 102 99  CO2 23 24 23   GLUCOSE 109* 103* 98  BUN 17 14 14   CREATININE 0.78 1.18* 0.88  CALCIUM 8.9 8.2* 8.2*  MG 2.0  --   --   GFRNONAA >60 49* >60  ANIONGAP 9 6 8     Hematology Recent Labs  Lab 02/04/23 0020 02/04/23 2155 02/05/23 0700 02/06/23 0304  WBC 6.7  --  6.4 8.2  RBC 3.65*  --  3.89 3.95  HGB 8.6* 9.8* 9.5* 9.5*  HCT 28.0* 31.1* 29.6* 29.6*  MCV 76.7*  --  76.1* 74.9*  MCH 23.6*  --  24.4* 24.1*  MCHC 30.7  --  32.1 32.1  RDW 16.4*  --  16.1* 16.5*  PLT 348  --  332 335  Radiology    ECHOCARDIOGRAM COMPLETE  Result Date: 02/04/2023    ECHOCARDIOGRAM REPORT   Patient Name:   Rachel Clark Date of Exam: 02/04/2023 Medical Rec #:  [id number]         Height:       68.0 in Accession #:    [id number]        Weight:       181.8 lb Date of Birth:  [birth year]        BSA:          1.963 m Patient Age:    71 years          BP:           131/60 mmHg Patient Gender: F                 HR:           85 bpm. Exam Location:  Inpatient Procedure: 2D Echo, Cardiac Doppler and Color Doppler Indications:    Chest pain R07.9  History:        Patient has prior history of Echocardiogram examinations, most                 recent 11/12/2021. CAD and Previous Myocardial Infarction, Prior                 CABG and Pacemaker; Risk Factors:Hypertension, Dyslipidemia and                 Former Smoker.  Sonographer:    Dondra Prader RVT RCS Referring Phys: [id number] Elmon Kirschner IMPRESSIONS  1. Left ventricular ejection fraction, by estimation, is 50 to 55%. The left ventricle has low normal function. The left ventricle demonstrates regional wall motion abnormalities (see scoring diagram/findings for description). Left ventricular diastolic   parameters are consistent with Grade I diastolic dysfunction (impaired relaxation).  2. Right ventricular systolic function is normal. The right ventricular size is normal. Tricuspid regurgitation signal is inadequate for assessing PA pressure.  3. The mitral valve is normal in structure. Mild to moderate mitral valve regurgitation. No evidence of mitral stenosis.  4. The aortic valve is tricuspid. Aortic valve regurgitation is not visualized. No aortic stenosis is present.  5. The inferior vena cava is normal in size with greater than 50% respiratory variability, suggesting right atrial pressure of 3 mmHg. FINDINGS  Left Ventricle: Left ventricular ejection fraction, by estimation, is 50 to 55%. The left ventricle has low normal function. The left ventricle demonstrates regional wall motion abnormalities. The left ventricular internal cavity size was normal in size. There is no left ventricular hypertrophy. Left ventricular diastolic parameters are consistent with Grade I diastolic dysfunction (impaired relaxation).  LV Wall Scoring: The basal inferior segment is akinetic. Right Ventricle: The right ventricular size is normal. No increase in right ventricular wall thickness. Right ventricular systolic function is normal. Tricuspid regurgitation signal is inadequate for assessing PA pressure. Left Atrium: Left atrial size was normal in size. Right Atrium: Right atrial size was normal in size. Pericardium: Trivial pericardial effusion is present. The pericardial effusion is circumferential. Mitral Valve: The mitral valve is normal in structure. Mild to moderate mitral valve regurgitation. No evidence of mitral valve stenosis. Tricuspid Valve: The tricuspid valve is not well visualized. Tricuspid valve regurgitation is not demonstrated. No evidence of tricuspid stenosis. Aortic Valve: The aortic valve is tricuspid. Aortic valve regurgitation is not visualized. No aortic stenosis is present. Aortic valve mean gradient  measures 3.2 mmHg. Aortic valve  peak gradient measures 6.0 mmHg. Aortic valve area, by VTI measures 3.59 cm. Pulmonic Valve: The pulmonic valve was normal in structure. Pulmonic valve regurgitation is trivial. No evidence of pulmonic stenosis. Aorta: The aortic root is normal in size and structure. Venous: The inferior vena cava is normal in size with greater than 50% respiratory variability, suggesting right atrial pressure of 3 mmHg. IAS/Shunts: No atrial level shunt detected by color flow Doppler.  LEFT VENTRICLE PLAX 2D LVIDd:         4.90 cm   Diastology LVIDs:         3.90 cm   LV e' medial:    9.57 cm/s LV PW:         1.10 cm   LV E/e' medial:  10.0 LV IVS:        1.10 cm   LV e' lateral:   12.50 cm/s LVOT diam:     2.50 cm   LV E/e' lateral: 7.6 LV SV:         76 LV SV Index:   39 LVOT Area:     4.91 cm  RIGHT VENTRICLE             IVC RV S prime:     10.40 cm/s  IVC diam: 1.50 cm TAPSE (M-mode): 2.5 cm LEFT ATRIUM             Index        RIGHT ATRIUM           Index LA diam:        4.10 cm 2.09 cm/m   RA Area:     12.10 cm LA Vol (A2C):   58.6 ml 29.83 ml/m  RA Volume:   29.30 ml  14.93 ml/m LA Vol (A4C):   49.9 ml 25.40 ml/m LA Biplane Vol: 57.8 ml 29.45 ml/m  AORTIC VALVE                    PULMONIC VALVE AV Area (Vmax):    3.64 cm     PV Vmax:       1.02 m/s AV Area (Vmean):   3.49 cm     PV Peak grad:  4.2 mmHg AV Area (VTI):     3.59 cm AV Vmax:           122.25 cm/s AV Vmean:          81.650 cm/s AV VTI:            0.211 m AV Peak Grad:      6.0 mmHg AV Mean Grad:      3.2 mmHg LVOT Vmax:         90.77 cm/s LVOT Vmean:        58.133 cm/s LVOT VTI:          0.154 m LVOT/AV VTI ratio: 0.73  AORTA Ao Asc diam: 3.40 cm MITRAL VALVE MV Area (PHT): 4.55 cm    SHUNTS MV Decel Time: 167 msec    Systemic VTI:  0.15 m MV E velocity: 95.33 cm/s  Systemic Diam: 2.50 cm MV A velocity: 82.37 cm/s MV E/A ratio:  1.16 Vishnu Priya Mallipeddi Electronically signed by Winfield Rast Mallipeddi Signature  Date/Time: 02/04/2023/3:48:59 PM    Final     Cardiac Studies   Echo 02/04/23 IMPRESSIONS    1. Left ventricular ejection fraction, by estimation, is 50 to 55%. The  left ventricle has low normal function. The left ventricle demonstrates  regional wall motion abnormalities (see scoring diagram/findings for  description). Left ventricular diastolic   parameters are consistent with Grade I diastolic dysfunction (impaired  relaxation).   2. Right ventricular systolic function is normal. The right ventricular  size is normal. Tricuspid regurgitation signal is inadequate for assessing  PA pressure.   3. The mitral valve is normal in structure. Mild to moderate mitral valve  regurgitation. No evidence of mitral stenosis.   4. The aortic valve is tricuspid. Aortic valve regurgitation is not  visualized. No aortic stenosis is present.   5. The inferior vena cava is normal in size with greater than 50%  respiratory variability, suggesting right atrial pressure of 3 mmHg.   Patient Profile     72 y.o. female with CAD (remote NSTEMI s/p PCI to RCA, s/p CABG 2007, subsequent failure of SVG-RCA by cath in 2016, DESx3 - full metal jacket- to RCA in 11/2015, NSTEMI 03/2021 with DESx3 to ramus intermedius), NSTEMI  CHB/syncope s/p PPM in 03/2015, recurrent syncope/dizziness then felt due to orthostasis, GIB 03/2021 s/p transfusion with gastric antrum vessel s/p epi/clipping, HL with statin intolerance, colon CA s/p resection and chemotherapy, seizures, memory loss, chronic hyponatremia, fibromyalgia, GERD, hypothyroidism, former smoker  , NSTEMI 10/2021- s/p complex PCI to LCX. who was seen 02/03/2023 for the evaluation of chest pain   Assessment & Plan    Chest Pain --  presented with chest pain for the last 2 weeks unrelieved by nitroglycerin -- question anemia/demand vs ischemic -- Chest pain resolved with blood transfusion 02/04/23  Iron Deficiency Anemia -- Hx of GI bleed with gastric clipping and Colon  CA s/p resection -- Ferratin 11, Iron 7 -- Hgb 9.4 on admission, dropped as low as 8.4. Received 1 unit PRBCs 02/04/24. Hgb stable at 9.5 -- continue IV Iron today   CAD -- Extensive cardiac history s/p CABG, full jacket RCA pci, DESx3 to ramus, pci to LCX -- Troponins neg, No EKG changes -- [facility] stress test today   For questions or updates, please contact [facility] Please consult [email or web address] for contact info under     Signed, Osborne Oman, RN, Student Nurse Practitioner 02/06/2023, 7:30 AM     Agree with note by Deniece Ree RN NP student  HGB stable. No further CP. Awaiting results of MV performed this morning.  Runell Gess, M.D., FACP, [facility], [facility] [address] [place], [place]  [address]  [phone] 02/06/2023 12:10 PM

## 2023-02-06 NOTE — H&P (View-Only) (Signed)
Referring Provider: TH Primary Care Physician:  Laurann Montana, MD Primary Gastroenterologist: [facility]  Reason for Consultation: Anemia, chest pain,  HPI: Rachel Clark is a 72 y.o. female with past medical history of coronary disease currently on aspirin and Plavix with last PCI in December 2023, history of gastric ulcer requiring endoscopic treatment in 2022 presented to the hospital with chest pain.  There was initial plan for left heart cath but her chest pain improved with blood transfusion.  She subsequently underwent stress test today.  GI is consulted for further evaluation of anemia.  Patient's blood work relatively showed stable anemia.  Her hemoglobin was 8.4 on February 03, 2023.  Which was 1 g less than hemoglobin of 9.4 in end of July 2024.  Patient seen and examined at bedside.  She denies seeing any blood in the stool or black stool.  She denies any abdominal pain, nausea or vomiting.  Complaining of intermittent chest pain.  Last EGD in May 2023 showed small clean-based prepyloric gastric ulcer without have any evidence of active bleeding and gastritis.  Patient with history of colon cancer status post transverse colectomy in 2013, last colonoscopy in 2019 showed multiple tubular adenomas as well as sessile serrated adenoma and repeat was recommended in 3 years.  Patient was scheduled for repeat colonoscopy in July 2024 which was subsequently canceled because of underlying cardiac issues.  Past Medical History:  Diagnosis Date   Arthritis    "fingers" (06/29/2016)   Chronic lower back pain    Colon cancer (HCC) 12/04/2011   s/p Laparoscopic-assisted transverse colectomy on 12/19/2011 by Dr. Dwain Sarna.  pT3 N0 M0.    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   last seizure was in 1972 ;takes  Tegretol (06/29/2016)    Headache    "weekly" (06/29/2016)   Hyperlipidemia    Hyponatremia    Hypothyroid    Iron deficiency anemia 11/17/2011   Memory change 11/13/2016   Monoclonal gammopathy of unknown significance    Orthostatic hypotension    Presence of permanent cardiac pacemaker    Syncope and collapse    pacemaker implanted    Past Surgical History:  Procedure Laterality Date   ANTERIOR CERVICAL DECOMP/DISCECTOMY FUSION  1980's   BACK SURGERY     CARDIAC CATHETERIZATION  05/20/2009   obstructive native vessel disease in LAD, RCA, and first diagonal, patent vein graft to distal RCA and LIMA to LAD,normal. ef 60%   CARDIAC CATHETERIZATION N/A 03/24/2015   Procedure: Left Heart Cath and Cors/Grafts Angiography;  Surgeon: Iran Ouch, MD;  Location: [facility] INVASIVE CV LAB;  Service: Cardiovascular;  Laterality: N/A;   CARDIAC CATHETERIZATION N/A 07/28/2015   Procedure: Left Heart Cath and Coronary Angiography;  Surgeon: Lennette Bihari, MD;  Location: [facility];  Service: Cardiovascular;  Laterality: N/A;   CARDIAC CATHETERIZATION N/A 11/09/2015   Procedure: Coronary Stent Intervention;  Surgeon: Tonny Bollman, MD;  Location: [facility] INVASIVE CV LAB;  Service: Cardiovascular;  Laterality: N/A;   CARDIAC CATHETERIZATION N/A 11/09/2015   Procedure: Left Heart Cath and Coronary Angiography;  Surgeon: Tonny Bollman, MD;  Location: [facility] INVASIVE CV LAB;  Service: Cardiovascular;  Laterality: N/A;   CARDIAC CATHETERIZATION N/A 06/29/2016   Procedure: Left Heart Cath and  Coronary Angiography;  Surgeon: Yvonne Kendall, MD;  Location: [facility] INVASIVE CV LAB;  Service: Cardiovascular;  Laterality: N/A;   CARDIAC CATHETERIZATION N/A 06/29/2016   Procedure: Intravascular Pressure Wire/FFR Study;  Surgeon: Yvonne Kendall, MD;  Location: [facility] INVASIVE CV LAB;  Service: Cardiovascular;  Laterality: N/A;   CARDIAC CATHETERIZATION N/A 06/29/2016   Procedure: Coronary Balloon Angioplasty;  Surgeon:  Yvonne Kendall, MD;  Location: [facility];  Service: Cardiovascular;  Laterality: N/A;   COLON RESECTION  12/19/2011   Procedure: COLON RESECTION LAPAROSCOPIC;  Surgeon: Emelia Loron, MD;  Location: [facility] OR;  Service: General;  Laterality: N/A;  laparoscopic hand assisted partial colon resection   COLON SURGERY     COLONOSCOPY     CORONARY ANGIOPLASTY WITH STENT PLACEMENT  ~ 2007   1 stent   CORONARY ARTERY BYPASS GRAFT  2007   "CABG X2"   DILATION AND CURETTAGE OF UTERUS  1973   EP IMPLANTABLE DEVICE N/A 03/25/2015   MDT Advisa DR pacemaker implanted by Dr Johney Frame for transient complete heart block and syncope   ESOPHAGOGASTRODUODENOSCOPY (EGD) WITH PROPOFOL N/A 03/14/2021   Procedure: ESOPHAGOGASTRODUODENOSCOPY (EGD) WITH PROPOFOL;  Surgeon: Kerin Salen, MD;  Location: [facility] ENDOSCOPY;  Service: Gastroenterology;  Laterality: N/A;   ESOPHAGOGASTRODUODENOSCOPY (EGD) WITH PROPOFOL N/A 11/13/2021   Procedure: ESOPHAGOGASTRODUODENOSCOPY (EGD) WITH PROPOFOL;  Surgeon: Kathi Der, MD;  Location: [facility] ENDOSCOPY;  Service: Gastroenterology;  Laterality: N/A;   HEMOSTASIS CLIP PLACEMENT  03/14/2021   Procedure: HEMOSTASIS CLIP PLACEMENT;  Surgeon: Kerin Salen, MD;  Location: [facility] ENDOSCOPY;  Service: Gastroenterology;;   INSERT / REPLACE / REMOVE PACEMAKER     LEFT HEART CATH AND CORONARY ANGIOGRAPHY N/A 11/11/2021   Procedure: LEFT HEART CATH AND CORONARY ANGIOGRAPHY;  Surgeon: Marykay Lex, MD;  Location: [facility] INVASIVE CV LAB;  Service: Cardiovascular;  Laterality: N/A;   LEFT HEART CATH AND CORS/GRAFTS ANGIOGRAPHY N/A 02/21/2018   Procedure: LEFT HEART CATH AND CORS/GRAFTS ANGIOGRAPHY;  Surgeon: Runell Gess, MD;  Location: [facility] INVASIVE CV LAB;  Service: Cardiovascular;  Laterality: N/A;   LEFT HEART CATH AND CORS/GRAFTS ANGIOGRAPHY N/A 03/08/2021   Procedure: LEFT HEART CATH AND CORS/GRAFTS ANGIOGRAPHY;  Surgeon: Swaziland, Peter M, MD;  Location: [facility] INVASIVE CV LAB;  Service:  Cardiovascular;  Laterality: N/A;   PORT-A-CATH REMOVAL N/A 06/12/2013   Procedure: REMOVAL PORT-A-CATH;  Surgeon: Emelia Loron, MD;  Location: [facility];  Service: General;  Laterality: N/A;   PORTACATH PLACEMENT  06/04/2012   Procedure: INSERTION PORT-A-CATH;  Surgeon: Emelia Loron, MD;  Location: [facility] OR;  Service: General;  Laterality: N/A;  Insertion of port-a-cath    POSTERIOR LAMINECTOMY / DECOMPRESSION CERVICAL SPINE  1990s   SCLEROTHERAPY  03/14/2021   Procedure: SCLEROTHERAPY;  Surgeon: Kerin Salen, MD;  Location: [facility] ENDOSCOPY;  Service: Gastroenterology;;   VAGINAL HYSTERECTOMY      Prior to Admission medications   Medication Sig Start Date End Date Taking? Authorizing Provider  acetaminophen (TYLENOL) 500 MG tablet Take 500-1,000 mg by mouth daily as needed for mild pain or headache.   Yes [provider]  aspirin 81 MG chewable tablet Chew 1 tablet (81 mg total) by mouth daily. 06/30/16  Yes Little Ishikawa, NP  Azelastine HCl 0.15 % SOLN Place 1 spray into both nostrils daily as needed (seasonal allergies). 12/22/14  Yes [provider]  Bempedoic Acid-Ezetimibe (NEXLIZET) 180-10 MG TABS Take 180 mg by mouth daily. 04/25/22  Yes [place], Peter M, MD  calcium carbonate (TUMS - DOSED  IN MG ELEMENTAL CALCIUM) 500 MG chewable tablet Chew 500 mg by mouth daily.   Yes [provider]  carbamazepine (TEGRETOL XR) 200 MG 12 hr tablet Take 3 tablets (600 mg total) by mouth 2 (two) times daily. 09/19/22 12/13/23 Yes Glean Salvo, NP  Cholecalciferol (VITAMIN D) 50 MCG (2000 UT) CAPS Take 4,000 Units by mouth daily.   Yes [provider]  diphenhydrAMINE (BENADRYL) 25 MG tablet Take 25 mg by mouth daily as needed for allergies.   Yes [provider]  diphenhydramine-acetaminophen (TYLENOL PM) 25-500 MG TABS tablet Take 1 tablet by mouth at bedtime as needed (Sleep).   Yes [provider]  DULoxetine (CYMBALTA) 60 MG  capsule Take 60 mg by mouth 2 (two) times daily.   Yes [provider]  ethosuximide (ZARONTIN) 250 MG capsule TAKE 2 CAPSULES IN THE MORNING, TAKE 2 CAPSULES AT NIGHT Patient taking differently: Take 500 mg by mouth See admin instructions. Take 2 capsules in the morning and then take 2 capsules by mouth at night 07/11/22  Yes Glean Salvo, NP  Evolocumab (REPATHA SURECLICK) 140 MG/ML SOAJ Inject 140 mg into the skin every 14 (fourteen) days. 12/25/22  Yes [place], Peter M, MD  ferrous sulfate 325 (65 FE) MG EC tablet Take 325 mg by mouth daily with breakfast.   Yes [provider]  fluticasone (FLONASE) 50 MCG/ACT nasal spray Place 1 spray into both nostrils daily as needed for allergies.   Yes [provider]  isosorbide mononitrate (IMDUR) 30 MG 24 hr tablet TAKE HALF TABLET (15MG  TOTAL) BY MOUTH EVERY DAY Patient taking differently: Take 15 mg by mouth daily. 04/20/22  Yes [place], Peter M, MD  levothyroxine (SYNTHROID, LEVOTHROID) 50 MCG tablet Take 50 mcg by mouth daily before breakfast.   Yes [provider]  memantine (NAMENDA) 10 MG tablet Take 1 tablet (10 mg total) by mouth 2 (two) times daily. 09/14/22  Yes Glean Salvo, NP  nitroGLYCERIN (NITROSTAT) 0.4 MG SL tablet place 1 tablet under the tongue every 5 minutes as needed for chest pain Patient taking differently: Place 0.4 mg under the tongue every 5 (five) minutes as needed for chest pain. 01/24/23  Yes [place], Peter M, MD  Oral Electrolytes (THERMOTABS PO) Take 452 mg by mouth daily. Thermotabs 452 mg sodium chloride   Yes [provider]  pantoprazole (PROTONIX) 40 MG tablet TAKE ONE TABLET BY MOUTH TWICE DAILY FOR 30 DAYS THEN TAKE ONE TABLET BY MOUTH DAILY Patient taking differently: Take 40 mg by mouth daily. 02/08/22  Yes [place], Peter M, MD  prasugrel (EFFIENT) 10 MG TABS tablet Take 1 tablet (10 mg total) by mouth daily. 04/25/22  Yes [place], Peter M, MD  ranolazine (RANEXA) 500 MG 12 hr  tablet Take 1 tablet (500 mg total) by mouth 2 (two) times daily. 02/02/23  Yes Duke Salvia, MD    Scheduled Meds:  aspirin  81 mg Oral Daily   Bempedoic Acid-Ezetimibe   Oral Daily   calcium carbonate  500 mg Oral Daily   carbamazepine  600 mg Oral BID   cholecalciferol  4,000 Units Oral Daily   DULoxetine  60 mg Oral BID   ethosuximide  500 mg Oral BID   ferrous sulfate  325 mg Oral Q breakfast   heparin  5,000 Units Subcutaneous Q8H   isosorbide mononitrate  30 mg Oral Daily   levothyroxine  50 mcg Oral QAC breakfast   memantine  10 mg Oral  BID   pantoprazole (PROTONIX) IV  40 mg Intravenous Q12H   prasugrel  10 mg Oral Daily   ranolazine  1,000 mg Oral BID   Continuous Infusions:  iron sucrose 68.8 mL/hr at 02/05/23 1507   PRN Meds:.acetaminophen, fluticasone, ondansetron (ZOFRAN) IV  Allergies as of 02/03/2023 - Review Complete 02/03/2023  Allergen Reaction Noted   Sulfamethoxazole Anaphylaxis 11/10/2020   Aspirin Other (See Comments) 12/30/2010   Crestor [rosuvastatin]  12/28/2020   Erythromycin Nausea Only 12/23/2010   Lisinopril Cough 12/23/2010   Niacin and related Other (See Comments) 12/30/2010   Penicillin g benzathine  09/06/2017   Statins Other (See Comments) 12/23/2010   Sulfa drugs cross reactors Swelling 12/23/2010   Tetracyclines & related Nausea Only 12/23/2010   Xeloda [capecitabine] Diarrhea 11/10/2020   Penicillins Nausea Only and Rash 12/23/2010   Plavix [clopidogrel bisulfate] Rash 02/20/2018    Family History  Problem Relation Age of Onset   Heart attack Father    Heart disease Father    Heart attack Brother 93   Cancer Mother        lung   Stroke Neg Hx     Social History   Socioeconomic History   Marital status: Married    Spouse name: Not on file   Number of children: 2   Years of education: 12   Highest education level: Not on file  Occupational History   Occupation: Retired  Tobacco Use   Smoking status: Former    Current  packs/day: 0.00    Average packs/day: 0.3 packs/day for 20.0 years (6.0 ttl pk-yrs)    Types: Cigarettes    Start date: 06/06/1961    Quit date: 06/06/1981    Years since quitting: 41.6   Smokeless tobacco: Never  Vaping Use   Vaping status: Never Used  Substance and Sexual Activity   Alcohol use: Yes    Alcohol/week: 1.0 standard drink of alcohol    Types: 1 Glasses of wine per week    Comment: occ   Drug use: No   Sexual activity: Not Currently    Birth control/protection: Surgical  Other Topics Concern   Not on file  Social History Narrative   Patient is married with 2 children.   Patient is right handed.   Patient has a high school education with some college education.   Patient drinks 2 cups daily.   Social Determinants of Health   Financial Resource Strain: Not on file  Food Insecurity: No Food Insecurity (02/03/2023)   Hunger Vital Sign    Worried About Running Out of Food in the Last Year: Never true    Ran Out of Food in the Last Year: Never true  Transportation Needs: No Transportation Needs (02/03/2023)   PRAPARE - Administrator, Civil Service (Medical): No    Lack of Transportation (Non-Medical): No  Physical Activity: Not on file  Stress: Not on file  Social Connections: Not on file  Intimate Partner Violence: Not At Risk (02/03/2023)   Humiliation, Afraid, Rape, and Kick questionnaire    Fear of Current or Ex-Partner: No    Emotionally Abused: No    Physically Abused: No    Sexually Abused: No    Review of Systems: All negative except as stated above in HPI.  Physical Exam: Vital signs: Vitals:   02/06/23 1035 02/06/23 1038  BP: (!) 132/53 129/77  Pulse: (!) 103 96  Resp:    Temp:    SpO2:  Last BM Date : 02/05/23 General:   Alert,  Well-developed, well-nourished, pleasant and cooperative in NAD Lungs: No visible respiratory distress Heart: Tachycardia Abdomen: Soft, nontender, nondistended, bowel sound present, no peritoneal  signs Rectal:  Deferred  GI:  Lab Results: Recent Labs    02/04/23 0020 02/04/23 2155 02/05/23 0700 02/06/23 0304  WBC 6.7  --  6.4 8.2  HGB 8.6* 9.8* 9.5* 9.5*  HCT 28.0* 31.1* 29.6* 29.6*  PLT 348  --  332 335   BMET Recent Labs    02/04/23 0020 02/05/23 0700 02/06/23 0304  NA 137 132* 130*  K 3.8 3.8 3.7  CL 105 102 99  CO2 23 24 23   GLUCOSE 109* 103* 98  BUN 17 14 14   CREATININE 0.78 1.18* 0.88  CALCIUM 8.9 8.2* 8.2*   LFT No results for input(s): "PROT", "ALBUMIN", "AST", "ALT", "ALKPHOS", "BILITOT", "BILIDIR", "IBILI" in the last 72 hours. PT/INR No results for input(s): "LABPROT", "INR" in the last 72 hours.   Studies/Results: ECHOCARDIOGRAM COMPLETE  Result Date: 02/04/2023    ECHOCARDIOGRAM REPORT   Patient Name:   CARREN ALTHEIDE Date of Exam: 02/04/2023 Medical Rec #:  [id number]         Height:       68.0 in Accession #:    [id number]        Weight:       181.8 lb Date of Birth:  [birth year]        BSA:          1.963 m Patient Age:    71 years          BP:           131/60 mmHg Patient Gender: F                 HR:           85 bpm. Exam Location:  Inpatient Procedure: 2D Echo, Cardiac Doppler and Color Doppler Indications:    Chest pain R07.9  History:        Patient has prior history of Echocardiogram examinations, most                 recent 11/12/2021. CAD and Previous Myocardial Infarction, Prior                 CABG and Pacemaker; Risk Factors:Hypertension, Dyslipidemia and                 Former Smoker.  Sonographer:    Dondra Prader RVT RCS Referring Phys: [id number] Elmon Kirschner IMPRESSIONS  1. Left ventricular ejection fraction, by estimation, is 50 to 55%. The left ventricle has low normal function. The left ventricle demonstrates regional wall motion abnormalities (see scoring diagram/findings for description). Left ventricular diastolic  parameters are consistent with Grade I diastolic dysfunction (impaired relaxation).  2. Right ventricular systolic  function is normal. The right ventricular size is normal. Tricuspid regurgitation signal is inadequate for assessing PA pressure.  3. The mitral valve is normal in structure. Mild to moderate mitral valve regurgitation. No evidence of mitral stenosis.  4. The aortic valve is tricuspid. Aortic valve regurgitation is not visualized. No aortic stenosis is present.  5. The inferior vena cava is normal in size with greater than 50% respiratory variability, suggesting right atrial pressure of 3 mmHg. FINDINGS  Left Ventricle: Left ventricular ejection fraction, by estimation, is 50 to 55%. The left ventricle has low normal function. The left ventricle demonstrates regional wall  motion abnormalities. The left ventricular internal cavity size was normal in size. There is no left ventricular hypertrophy. Left ventricular diastolic parameters are consistent with Grade I diastolic dysfunction (impaired relaxation).  LV Wall Scoring: The basal inferior segment is akinetic. Right Ventricle: The right ventricular size is normal. No increase in right ventricular wall thickness. Right ventricular systolic function is normal. Tricuspid regurgitation signal is inadequate for assessing PA pressure. Left Atrium: Left atrial size was normal in size. Right Atrium: Right atrial size was normal in size. Pericardium: Trivial pericardial effusion is present. The pericardial effusion is circumferential. Mitral Valve: The mitral valve is normal in structure. Mild to moderate mitral valve regurgitation. No evidence of mitral valve stenosis. Tricuspid Valve: The tricuspid valve is not well visualized. Tricuspid valve regurgitation is not demonstrated. No evidence of tricuspid stenosis. Aortic Valve: The aortic valve is tricuspid. Aortic valve regurgitation is not visualized. No aortic stenosis is present. Aortic valve mean gradient measures 3.2 mmHg. Aortic valve peak gradient measures 6.0 mmHg. Aortic valve area, by VTI measures 3.59 cm.  Pulmonic Valve: The pulmonic valve was normal in structure. Pulmonic valve regurgitation is trivial. No evidence of pulmonic stenosis. Aorta: The aortic root is normal in size and structure. Venous: The inferior vena cava is normal in size with greater than 50% respiratory variability, suggesting right atrial pressure of 3 mmHg. IAS/Shunts: No atrial level shunt detected by color flow Doppler.  LEFT VENTRICLE PLAX 2D LVIDd:         4.90 cm   Diastology LVIDs:         3.90 cm   LV e' medial:    9.57 cm/s LV PW:         1.10 cm   LV E/e' medial:  10.0 LV IVS:        1.10 cm   LV e' lateral:   12.50 cm/s LVOT diam:     2.50 cm   LV E/e' lateral: 7.6 LV SV:         76 LV SV Index:   39 LVOT Area:     4.91 cm  RIGHT VENTRICLE             IVC RV S prime:     10.40 cm/s  IVC diam: 1.50 cm TAPSE (M-mode): 2.5 cm LEFT ATRIUM             Index        RIGHT ATRIUM           Index LA diam:        4.10 cm 2.09 cm/m   RA Area:     12.10 cm LA Vol (A2C):   58.6 ml 29.83 ml/m  RA Volume:   29.30 ml  14.93 ml/m LA Vol (A4C):   49.9 ml 25.40 ml/m LA Biplane Vol: 57.8 ml 29.45 ml/m  AORTIC VALVE                    PULMONIC VALVE AV Area (Vmax):    3.64 cm     PV Vmax:       1.02 m/s AV Area (Vmean):   3.49 cm     PV Peak grad:  4.2 mmHg AV Area (VTI):     3.59 cm AV Vmax:           122.25 cm/s AV Vmean:          81.650 cm/s AV VTI:  0.211 m AV Peak Grad:      6.0 mmHg AV Mean Grad:      3.2 mmHg LVOT Vmax:         90.77 cm/s LVOT Vmean:        58.133 cm/s LVOT VTI:          0.154 m LVOT/AV VTI ratio: 0.73  AORTA Ao Asc diam: 3.40 cm MITRAL VALVE MV Area (PHT): 4.55 cm    SHUNTS MV Decel Time: 167 msec    Systemic VTI:  0.15 m MV E velocity: 95.33 cm/s  Systemic Diam: 2.50 cm MV A velocity: 82.37 cm/s MV E/A ratio:  1.16 Vishnu Priya Mallipeddi Electronically signed by Winfield Rast Mallipeddi Signature Date/Time: 02/04/2023/3:48:59 PM    Final     Impression/Plan: -Acute on chronic anemia in a patient with history  of gastric ulcer.  Patient denies any overt bleeding. -H/O  coronary artery disease, currently on aspirin and Effient. -Chest pain.  Patient with extensive coronary artery disease.  Underwent stress test today. -History of colon cancer.  Status post transverse colon resection in 2013.  Last colonoscopy was in 2019 and she is overdue for her colonoscopy.  Recommendations --------------------------- -Patient does not have any overt bleeding but she has chronic anemia.  Given her history of ulcer disease, I think it is reasonable to start with EGD first for evaluation of anemia. -Stress test negative for reversible ischemia today.  If okay with cardiology, will plan for EGD tomorrow afternoon. -Continue IV twice daily PPI.  Keep n.p.o. past midnight.  Risks (bleeding, infection, bowel perforation that could require surgery, sedation-related changes in cardiopulmonary systems), benefits (identification and possible treatment of source of symptoms, exclusion of certain causes of symptoms), and alternatives (watchful waiting, radiographic imaging studies, empiric medical treatment)  were explained to patient/family in detail and patient wishes to proceed.    LOS: 3 days   Kathi Der  MD, FACP 02/06/2023, 11:57 AM  Contact #  [phone]

## 2023-02-06 NOTE — Care Management Important Message (Signed)
Important Message  Patient Details  Name: Rachel Clark MRN: [id number] Date of Birth: 20-Dec-1950   Medicare Important Message Given:  Yes     Renie Ora 02/06/2023, 8:59 AM

## 2023-02-06 NOTE — Progress Notes (Signed)
   [name] presented for a lexiscan cardiolite today.  No immediate complications.  Stress imaging is pending at this time.  Laverda Page, NP 02/06/2023, 10:46 AM

## 2023-02-07 ENCOUNTER — Encounter (HOSPITAL_COMMUNITY)
Admission: EM | Disposition: A | Discharge status: Home / Self Care | Payer: Self-pay | Source: Home / Self Care | Attending: Cardiovascular Disease

## 2023-02-07 ENCOUNTER — Encounter (HOSPITAL_COMMUNITY): Payer: Self-pay | Admitting: Cardiology

## 2023-02-07 ENCOUNTER — Inpatient Hospital Stay (HOSPITAL_COMMUNITY): Payer: Medicare Other | Admitting: Certified Registered"

## 2023-02-07 DIAGNOSIS — I2510 Atherosclerotic heart disease of native coronary artery without angina pectoris: Secondary | ICD-10-CM

## 2023-02-07 DIAGNOSIS — Z87891 Personal history of nicotine dependence: Secondary | ICD-10-CM | POA: Diagnosis not present

## 2023-02-07 DIAGNOSIS — K209 Esophagitis, unspecified without bleeding: Secondary | ICD-10-CM | POA: Diagnosis not present

## 2023-02-07 DIAGNOSIS — R0789 Other chest pain: Secondary | ICD-10-CM | POA: Diagnosis not present

## 2023-02-07 DIAGNOSIS — I10 Essential (primary) hypertension: Secondary | ICD-10-CM

## 2023-02-07 HISTORY — PX: ESOPHAGOGASTRODUODENOSCOPY (EGD) WITH PROPOFOL: SHX5813

## 2023-02-07 LAB — BASIC METABOLIC PANEL
Anion gap: 11 (ref 5–15)
BUN: 16 mg/dL (ref 8–23)
CO2: 20 mmol/L — ABNORMAL LOW (ref 22–32)
Calcium: 8 mg/dL — ABNORMAL LOW (ref 8.9–10.3)
Chloride: 100 mmol/L (ref 98–111)
Creatinine, Ser: 0.74 mg/dL (ref 0.44–1.00)
GFR, Estimated: >60 mL/min (ref >60)
Glucose, Bld: 103 mg/dL — ABNORMAL HIGH (ref 70–99)
Potassium: 3.6 mmol/L (ref 3.5–5.1)
Sodium: 131 mmol/L — ABNORMAL LOW (ref 135–145)

## 2023-02-07 LAB — CBC
HCT: 29.3 % — ABNORMAL LOW (ref 36.0–46.0)
Hemoglobin: 9.2 g/dL — ABNORMAL LOW (ref 12.0–15.0)
MCH: 24.3 pg — ABNORMAL LOW (ref 26.0–34.0)
MCHC: 31.4 g/dL (ref 30.0–36.0)
MCV: 77.3 fL — ABNORMAL LOW (ref 80.0–100.0)
Platelets: 330 10*3/uL (ref 150–400)
RBC: 3.79 MIL/uL — ABNORMAL LOW (ref 3.87–5.11)
RDW: 17.2 % — ABNORMAL HIGH (ref 11.5–15.5)
WBC: 7.7 10*3/uL (ref 4.0–10.5)
nRBC: 0.3 % — ABNORMAL HIGH (ref 0.0–0.2)

## 2023-02-07 SURGERY — ESOPHAGOGASTRODUODENOSCOPY (EGD) WITH PROPOFOL
Anesthesia: Monitor Anesthesia Care

## 2023-02-07 MED ORDER — NYSTATIN 100000 UNIT/ML MT SUSP
5.0000 mL | Freq: Four times a day (QID) | OROMUCOSAL | Status: DC
  Administered 2023-02-07 – 2023-02-08 (×4): 500000 [IU] via ORAL
  Filled 2023-02-07 (×4): qty 5

## 2023-02-07 MED ORDER — AMLODIPINE BESYLATE 5 MG PO TABS
5.0000 mg | ORAL_TABLET | Freq: Every day | ORAL | Status: DC
  Administered 2023-02-08: 5 mg via ORAL
  Filled 2023-02-07: qty 1

## 2023-02-07 MED ORDER — SODIUM CHLORIDE 0.9 % IV SOLN: INTRAVENOUS | Status: DC

## 2023-02-07 MED ORDER — PROPOFOL 500 MG/50ML IV EMUL
INTRAVENOUS | Status: DC | PRN
  Administered 2023-02-07: 25 mg via INTRAVENOUS
  Administered 2023-02-07: 125 ug/kg/min via INTRAVENOUS
  Administered 2023-02-07 (×2): 25 mg via INTRAVENOUS

## 2023-02-07 SURGICAL SUPPLY — 15 items

## 2023-02-07 NOTE — Interval H&P Note (Signed)
History and Physical Interval Note:  02/07/2023 11:27 AM  Rachel Clark  has presented today for surgery, with the diagnosis of Anemia.  The various methods of treatment have been discussed with the patient and family. After consideration of risks, benefits and other options for treatment, the patient has consented to  Procedure(s): ESOPHAGOGASTRODUODENOSCOPY (EGD) WITH PROPOFOL (N/A) as a surgical intervention.  The patient's history has been reviewed, patient examined, no change in status, stable for surgery.  I have reviewed the patient's chart and labs.  Questions were answered to the patient's satisfaction.     Starkeisha Vanwinkle

## 2023-02-07 NOTE — Progress Notes (Signed)
PT Cancellation Note  Patient Details Name: Rachel Clark MRN: [id number] DOB: 12-07-1950   Cancelled Treatment:    Reason Eval/Treat Not Completed: Patient at procedure or test/unavailable.  Pt in endo on arrival ~2 hours ago and is still there.  Will check back later as able.    [name] 02/07/2023, 12:26 PM

## 2023-02-07 NOTE — Transfer of Care (Signed)
Immediate Anesthesia Transfer of Care Note  Patient: Rachel Clark  Procedure(s) Performed: ESOPHAGOGASTRODUODENOSCOPY (EGD) WITH PROPOFOL  Patient Location: Endoscopy Unit  Anesthesia Type:MAC  Level of Consciousness: drowsy and patient cooperative  Airway & Oxygen Therapy: Patient Spontanous Breathing and Patient connected to nasal cannula oxygen  Post-op Assessment: Report given to RN and Post -op Vital signs reviewed and stable  Post vital signs: Reviewed and stable  Last Vitals:  Vitals Value Taken Time  BP    Temp    Pulse    Resp    SpO2      Last Pain:  Vitals:   02/07/23 1040  TempSrc: Temporal  PainSc:       Patients Stated Pain Goal: 0 (02/05/23 0800)  Complications: No notable events documented.

## 2023-02-07 NOTE — Op Note (Signed)
[facility] Patient Name: Rachel Clark Procedure Date : 02/07/2023 MRN: [id number] Attending MD: Kathi Der , MD, [id number] Date of Birth: Nov 24, 1950 CSN: [id number] Age: 72 Admit Type: Inpatient Procedure:                Upper GI endoscopy Indications:              Iron deficiency anemia Providers:                Kathi Der, MD, Stephens Shire RN, RN, Salley Scarlet, Technician Referring MD:              Medicines:                Sedation Administered by an Anesthesia Professional Complications:            No immediate complications. Estimated Blood Loss:     Estimated blood loss was minimal. Procedure:                Pre-Anesthesia Assessment:                           - Prior to the procedure, a History and Physical                            was performed, and patient medications and                            allergies were reviewed. The patient's tolerance of                            previous anesthesia was also reviewed. The risks                            and benefits of the procedure and the sedation                            options and risks were discussed with the patient.                            All questions were answered, and informed consent                            was obtained. Prior Anticoagulants: The patient                            last took Effient (prasugrel) 1 day prior to the                            procedure and last took heparin on the day of the                            procedure. ASA Grade Assessment: III - A patient  with severe systemic disease. After reviewing the                            risks and benefits, the patient was deemed in                            satisfactory condition to undergo the procedure.                           After obtaining informed consent, the endoscope was                            passed under direct vision. Throughout the                             procedure, the patient's blood pressure, pulse, and                            oxygen saturations were monitored continuously. The                            GIF-H190 ([id number] Olympus endoscope was introduced                            through the mouth, and advanced to the second part                            of duodenum. The upper GI endoscopy was                            accomplished without difficulty. The patient                            tolerated the procedure well. Scope In: Scope Out: Findings:      LA Grade B (one or more mucosal breaks greater than 5 mm, not extending       between the tops of two mucosal folds) esophagitis was found in the mid       and distal esophagus.      Multiple localized erosions with no bleeding and no stigmata of recent       bleeding were found in the gastric body and in the prepyloric region of       the stomach.      The cardia and gastric fundus were normal on retroflexion.      The duodenal bulb, first portion of the duodenum and second portion of       the duodenum were normal. Impression:               - LA Grade B esophagitis.                           - Erosive gastropathy with no bleeding and no                            stigmata of recent bleeding.                           -  Normal duodenal bulb, first portion of the                            duodenum and second portion of the duodenum.                           - No specimens collected. Recommendation:           - Return patient to hospital ward for ongoing care.                           - Cardiac diet.                           - Continue present medications.                           - Nystatin suspension 100,000 units PO QID for 10                            days. Procedure Code(s):        --- Professional ---                           [phone], Esophagogastroduodenoscopy, flexible,                            transoral; diagnostic, including  collection of                            specimen(s) by brushing or washing, when performed                            (separate procedure) Diagnosis Code(s):        --- Professional ---                           K20.90, Esophagitis, unspecified without bleeding                           K31.89, Other diseases of stomach and duodenum                           D50.9, Iron deficiency anemia, unspecified CPT copyright 2022 American Medical Association. All rights reserved. The codes documented in this report are preliminary and upon coder review may  be revised to meet current compliance requirements. Kathi Der, MD Kathi Der, MD 02/07/2023 12:12:34 PM Number of Addenda: 0

## 2023-02-07 NOTE — Anesthesia Preprocedure Evaluation (Signed)
Anesthesia Evaluation  Patient identified by MRN, date of birth, ID band Patient awake    Reviewed: Allergy & Precautions, H&P , NPO status , Patient's Chart, lab work & pertinent test results  Airway Mallampati: II  TM Distance: >3 FB Neck ROM: Full    Dental no notable dental hx.    Pulmonary neg pulmonary ROS, former smoker   Pulmonary exam normal breath sounds clear to auscultation       Cardiovascular hypertension, + CAD, + Past MI, + Cardiac Stents and + CABG  + pacemaker  Rhythm:Regular Rate:Normal + Systolic murmurs EF 50-55%   Neuro/Psych Seizures -, Well Controlled,   negative psych ROS   GI/Hepatic Neg liver ROS, PUD,GERD  Medicated,,  Endo/Other  Hypothyroidism    Renal/GU negative Renal ROS  negative genitourinary   Musculoskeletal negative musculoskeletal ROS (+)    Abdominal   Peds negative pediatric ROS (+)  Hematology  (+) Blood dyscrasia, anemia   Anesthesia Other Findings   Reproductive/Obstetrics negative OB ROS                             Anesthesia Physical Anesthesia Plan  ASA: 3  Anesthesia Plan: MAC   Post-op Pain Management: Minimal or no pain anticipated   Induction: Intravenous  PONV Risk Score and Plan: 2 and Propofol infusion and Treatment may vary due to age or medical condition  Airway Management Planned: Simple Face Mask  Additional Equipment:   Intra-op Plan:   Post-operative Plan:   Informed Consent: I have reviewed the patients History and Physical, chart, labs and discussed the procedure including the risks, benefits and alternatives for the proposed anesthesia with the patient or authorized representative who has indicated his/her understanding and acceptance.     Dental advisory given  Plan Discussed with: CRNA and Surgeon  Anesthesia Plan Comments:        Anesthesia Quick Evaluation

## 2023-02-07 NOTE — Progress Notes (Addendum)
Rounding Note    Patient Name: Rachel Clark Date of Encounter: 02/07/2023  [facility] Cardiologist: Peter Swaziland, MD   Subjective   No complaints, awaiting EGD  Inpatient Medications    Scheduled Meds:  aspirin  81 mg Oral Daily   Bempedoic Acid-Ezetimibe   Oral Daily   calcium carbonate  500 mg Oral Daily   carbamazepine  600 mg Oral BID   cholecalciferol  4,000 Units Oral Daily   DULoxetine  60 mg Oral BID   ethosuximide  500 mg Oral BID   ferrous sulfate  325 mg Oral Q breakfast   heparin  5,000 Units Subcutaneous Q8H   isosorbide mononitrate  30 mg Oral Daily   levothyroxine  50 mcg Oral QAC breakfast   memantine  10 mg Oral BID   pantoprazole (PROTONIX) IV  40 mg Intravenous Q12H   prasugrel  10 mg Oral Daily   ranolazine  1,000 mg Oral BID   Continuous Infusions:  sodium chloride 20 mL/hr at 02/07/23 0627   PRN Meds: acetaminophen, fluticasone, ondansetron (ZOFRAN) IV   Vital Signs    Vitals:   02/06/23 1038 02/06/23 1608 02/06/23 2015 02/07/23 0403  BP: 129/77 104/61 (!) 129/56 (!) 147/63  Pulse: 96 74 84 78  Resp:  14 18 16   Temp:  98.1 F (36.7 C) 98.1 F (36.7 C) 98.1 F (36.7 C)  TempSrc:  Oral Oral Oral  SpO2:  93% 95% 93%  Weight:      Height:       No intake or output data in the 24 hours ending 02/07/23 0724    02/03/2023   10:02 PM 02/03/2023    7:02 PM 02/02/2023   10:29 AM  Last 3 Weights  Weight (lbs) 181 lb 12.8 oz 182 lb 182 lb  Weight (kg) 82.464 kg 82.555 kg 82.555 kg      Telemetry    NSR HR 70s - Personally Reviewed  ECG    No new tracing  Physical Exam   GEN: No acute distress.   Cardiac: RRR, no murmurs, rubs, or gallops.  Respiratory: Clear to auscultation bilaterally. MS: No edema; No deformity. Neuro:  Nonfocal  Psych: Normal affect   Labs    High Sensitivity Troponin:   Recent Labs  Lab 01/28/23 1832 01/28/23 2125 02/03/23 1901 02/03/23 2238 02/04/23 0020  TROPONINIHS 8 8 9 9 9       Chemistry Recent Labs  Lab 02/04/23 0020 02/05/23 0700 02/06/23 0304  NA 137 132* 130*  K 3.8 3.8 3.7  CL 105 102 99  CO2 23 24 23   GLUCOSE 109* 103* 98  BUN 17 14 14   CREATININE 0.78 1.18* 0.88  CALCIUM 8.9 8.2* 8.2*  MG 2.0  --   --   GFRNONAA >60 49* >60  ANIONGAP 9 6 8     Hematology Recent Labs  Lab 02/04/23 0020 02/04/23 2155 02/05/23 0700 02/06/23 0304  WBC 6.7  --  6.4 8.2  RBC 3.65*  --  3.89 3.95  HGB 8.6* 9.8* 9.5* 9.5*  HCT 28.0* 31.1* 29.6* 29.6*  MCV 76.7*  --  76.1* 74.9*  MCH 23.6*  --  24.4* 24.1*  MCHC 30.7  --  32.1 32.1  RDW 16.4*  --  16.1* 16.5*  PLT 348  --  332 335    Radiology    NM Myocar Multi W/Spect W/Wall Motion / EF  Result Date: 02/06/2023 CLINICAL DATA:  Nonspecific chest pain EXAM: MYOCARDIAL IMAGING WITH  SPECT (REST AND PHARMACOLOGIC-STRESS) GATED LEFT VENTRICULAR WALL MOTION STUDY LEFT VENTRICULAR EJECTION FRACTION TECHNIQUE: Standard myocardial SPECT imaging was performed after resting intravenous injection of 10.7 mCi Tc-68m Myoview. Subsequently, intravenous infusion of Lexiscan was performed under the supervision of the Cardiology staff. At peak effect of the drug, 32.3 mCi Tc-33m Myoview was injected intravenously and standard myocardial SPECT imaging was performed. Quantitative gated imaging was also performed to evaluate left ventricular wall motion, and estimate left ventricular ejection fraction. COMPARISON:  None Available. FINDINGS: Perfusion: No decreased activity in the left ventricle on stress imaging to suggest reversible ischemia or infarction. Wall Motion: Normal left ventricular wall motion. No left ventricular dilation. Left Ventricular Ejection Fraction: 61% End diastolic volume 111 ml End systolic volume 44 ml IMPRESSION: 1. No reversible ischemia or infarction. 2. Normal left ventricular wall motion. 3. Left ventricular ejection fraction 61% 4. Non invasive risk stratification*: Low *2012 Appropriate Use Criteria for  Coronary Revascularization Focused Update: J Am Coll Cardiol. 2012;59(9):857-881. http://content.[email or web address].aspx?articleid=[email or web address] Electronically Signed   By: Karen Kays M.D.   On: 02/06/2023 13:08    Cardiac Studies   Nuke Stress test 02/06/23 IMPRESSION: 1. No reversible ischemia or infarction.   2. Normal left ventricular wall motion.   3. Left ventricular ejection fraction 61%   4. Non invasive risk stratification*: Low  Echo 02/04/23 IMPRESSIONS    1. Left ventricular ejection fraction, by estimation, is 50 to 55%. The  left ventricle has low normal function. The left ventricle demonstrates  regional wall motion abnormalities (see scoring diagram/findings for  description). Left ventricular diastolic   parameters are consistent with Grade I diastolic dysfunction (impaired  relaxation).   2. Right ventricular systolic function is normal. The right ventricular  size is normal. Tricuspid regurgitation signal is inadequate for assessing  PA pressure.   3. The mitral valve is normal in structure. Mild to moderate mitral valve  regurgitation. No evidence of mitral stenosis.   4. The aortic valve is tricuspid. Aortic valve regurgitation is not  visualized. No aortic stenosis is present.   5. The inferior vena cava is normal in size with greater than 50%  respiratory variability, suggesting right atrial pressure of 3 mmHg.  Patient Profile     72 y.o. female with CAD (remote NSTEMI s/p PCI to RCA, s/p CABG 2007, subsequent failure of SVG-RCA by cath in 2016, DESx3 - full metal jacket- to RCA in 11/2015, NSTEMI 03/2021 with DESx3 to ramus intermedius), NSTEMI  CHB/syncope s/p PPM in 03/2015, recurrent syncope/dizziness then felt due to orthostasis, GIB 03/2021 s/p transfusion with gastric antrum vessel s/p epi/clipping, HL with statin intolerance, colon CA s/p resection and chemotherapy, seizures, memory loss, chronic hyponatremia, fibromyalgia, GERD, hypothyroidism, former  smoker, NSTEMI 10/2021- s/p complex PCI to LCX. who was seen 02/03/2023 for the evaluation of chest pain   Assessment & Plan    Chest Pain --  presented with chest pain for the last 2 weeks unrelieved by nitroglycerin -- suspect anemia/demand instead of ischemia -- Chest pain resolved with blood transfusion 02/04/23  Iron Deficiency Anemia -- Hx of GI bleed with gastric clipping and Colon CA s/p resection -- Ferratin 11, Iron 7 -- Hgb 9.4 on admission, dropped as low as 8.4. Received 1 unit PRBCs 02/04/24. CBC pending this AM -- transitions to PO iron today -- EGD today per GI  CAD -- Extensive cardiac history s/p CABG, full jacket RCA pci, DESx3 to ramus, pci to LCX -- Troponins neg, No EKG changes -- nuke  stress test yesterday showed no reversible ischemia or infarction, normal wall motion, EF 61%.  -- d/c ranexa due to lack of benefit per pt and interaction with tegretol.  -- add amlodipine 5mg  daily to start tomorrow -- continue aspirin, imdur, next repatha dose not due until next week   For questions or updates, please contact [facility] Please consult [email or web address] for contact info under      Signed, Osborne Oman, RN, Student Nurse Practitioner 02/07/2023, 7:24 AM     Agree with note by Deniece Ree RN/NP student  Lexiscan yesterday was low risk.  No further chest pain.  Hemoglobin stable.  Ranexa discontinued and amlodipine substituted starting tomorrow.  Cleared for GI evaluation including EGD plus or minus colonoscopy.  Runell Gess, M.D., FACP, [facility], [place] [facility] [address] [place], [place]  [address]  [phone] 02/07/2023 10:04 AM

## 2023-02-07 NOTE — Brief Op Note (Signed)
02/07/2023  12:10 PM  PATIENT:  [name]  72 y.o. female  PRE-OPERATIVE DIAGNOSIS:  Anemia  POST-OPERATIVE DIAGNOSIS:  gastritis, esophagitis  PROCEDURE:  Procedure(s): ESOPHAGOGASTRODUODENOSCOPY (EGD) WITH PROPOFOL (N/A)  SURGEON:  Surgeons and Role:    * Josemiguel Gries, MD - Primary  Findings ------------ -EGD showed LA grade B esophagitis and gastric erosions.  No evidence of active bleeding.  No gastric ulcers.  Biopsies not performed because of antiplatelet and anticoagulation use.  Recommendations -------------------------- -Continue Protonix 40 mg IV twice a day while in the hospital, switch to Protonix p.o. 40 mg once a day on discharge. Recommend   to continue indefinite Protonix given history of recurrent gastric ulcer and gastritis. -Nystatin swish and swallow for 10 days -No further inpatient GI workup planned.  GI will sign off.  Follow-up with Dr. Lavonia Drafts in 2 to 3 months after discharge  Kathi Der MD, FACP 02/07/2023, 12:12 PM  Contact #  [phone]

## 2023-02-07 NOTE — Anesthesia Postprocedure Evaluation (Signed)
Anesthesia Post Note  Patient: Rachel Clark  Procedure(s) Performed: ESOPHAGOGASTRODUODENOSCOPY (EGD) WITH PROPOFOL     Patient location during evaluation: PACU Anesthesia Type: MAC Level of consciousness: awake and alert Pain management: pain level controlled Vital Signs Assessment: post-procedure vital signs reviewed and stable Respiratory status: spontaneous breathing, nonlabored ventilation, respiratory function stable and patient connected to nasal cannula oxygen Cardiovascular status: stable and blood pressure returned to baseline Postop Assessment: no apparent nausea or vomiting Anesthetic complications: no  No notable events documented.  Last Vitals:  Vitals:   02/07/23 1040 02/07/23 1200  BP: (!) 159/77 (!) 137/55  Pulse: 80 73  Resp: 16 18  Temp: 36.5 C 36.5 C  SpO2: 95% 97%    Last Pain:  Vitals:   02/07/23 1200  TempSrc:   PainSc: 0-No pain                 Josedaniel Haye S

## 2023-02-07 NOTE — Evaluation (Signed)
Physical Therapy Evaluation Patient Details Name: Rachel Clark MRN: [id number] DOB: 1951/04/29 Today's Date: 02/07/2023  History of Present Illness  Pt is a 72 y/o female admitted with recurrent and worsening CP and pressure.  PMHx:  colon CA, CAD, fibromyalgia, memory changes, CAD, Heart block  Clinical Impression  Pt admitted with/for chest pain.  Pt is presently not at baseline functioning, but is also not a good historian.  Pt needing minimal assist to mobilize OOB and for gait with a RW.  Pt currently limited functionally due to the problems listed below.  (see problems list.)  Pt will benefit from PT to maximize function and safety to be able to get home safely with available assist.         If plan is discharge home, recommend the following: A little help with walking and/or transfers;A little help with bathing/dressing/bathroom;Direct supervision/assist for medications management;Direct supervision/assist for financial management;Assist for transportation;Supervision due to cognitive status;Help with stairs or ramp for entrance   Can travel by private vehicle        Equipment Recommendations Other (comment) (TBD)  Recommendations for Other Services       Functional Status Assessment Patient has had a recent decline in their functional status and demonstrates the ability to make significant improvements in function in a reasonable and predictable amount of time.     Precautions / Restrictions Precautions Precautions: Fall      Mobility  Bed Mobility Overal bed mobility: Needs Assistance Bed Mobility: Supine to Sit     Supine to sit: Contact guard     General bed mobility comments: no assist needed from a relatively flat bed.    Transfers Overall transfer level: Needs assistance Equipment used: Rolling walker (2 wheels) Transfers: Sit to/from Stand Sit to Stand: Contact guard assist           General transfer comment: cues for hand placement and safety,  CGA    Ambulation/Gait Ambulation/Gait assistance: Min assist Gait Distance (Feet): 180 Feet Assistive device: Rolling walker (2 wheels) Gait Pattern/deviations: Step-through pattern, Decreased stride length   Gait velocity interpretation: <1.8 ft/sec, indicate of risk for recurrent falls   General Gait Details: mildly unsteady overall, cues for posture, min stability assist, especially as she fatigued.  Stairs            Wheelchair Mobility     Tilt Bed    Modified Rankin (Stroke Patients Only)       Balance                                             Pertinent Vitals/Pain Pain Assessment Pain Assessment: Faces Faces Pain Scale: No hurt Pain Location: presently no chest pain Pain Intervention(s): Monitored during session    Home Living Family/patient expects to be discharged to:: Private residence Living Arrangements: Spouse/significant other;Children Available Help at Discharge: Family;Available 24 hours/day Type of Home: House Home Access: Stairs to enter       Home Layout: One level Home Equipment: Agricultural consultant (2 wheels)      Prior Function Prior Level of Function : Independent/Modified Independent             Mobility Comments: Independent at home       Extremity/Trunk Assessment   Upper Extremity Assessment Upper Extremity Assessment: Defer to OT evaluation    Lower Extremity Assessment Lower Extremity Assessment: Generalized  weakness    Cervical / Trunk Assessment Cervical / Trunk Assessment: Normal  Communication   Communication Communication: No apparent difficulties  Cognition Arousal: Alert Behavior During Therapy: Flat affect Overall Cognitive Status: History of cognitive impairments - at baseline (pt unable to answer many of her environmental or functional mobility questions)                                          General Comments      Exercises     Assessment/Plan    PT  Assessment Patient needs continued PT services  PT Problem List Decreased strength;Decreased activity tolerance;Decreased balance;Decreased mobility;Cardiopulmonary status limiting activity       PT Treatment Interventions DME instruction;Gait training;Stair training;Functional mobility training;Therapeutic activities;Balance training;Patient/family education    PT Goals (Current goals can be found in the Care Plan section)  Acute Rehab PT Goals Patient Stated Goal: I want to go home PT Goal Formulation: With patient Time For Goal Achievement: 02/21/23 Potential to Achieve Goals: Good    Frequency Min 1X/week     Co-evaluation               AM-PAC PT "6 Clicks" Mobility  Outcome Measure Help needed turning from your back to your side while in a flat bed without using bedrails?: None Help needed moving from lying on your back to sitting on the side of a flat bed without using bedrails?: None Help needed moving to and from a bed to a chair (including a wheelchair)?: A Little Help needed standing up from a chair using your arms (e.g., wheelchair or bedside chair)?: A Little Help needed to walk in hospital room?: A Little Help needed climbing 3-5 steps with a railing? : A Lot 6 Click Score: 19    End of Session Equipment Utilized During Treatment: Gait belt Activity Tolerance: Patient tolerated treatment well;Patient limited by fatigue Patient left: in bed;with nursing/sitter in room;with call bell/phone within reach Nurse Communication: Mobility status PT Visit Diagnosis: Unsteadiness on feet (R26.81);Difficulty in walking, not elsewhere classified (R26.2)    Time: 1610-9604 PT Time Calculation (min) (ACUTE ONLY): 17 min   Charges:   PT Evaluation $PT Eval Moderate Complexity: 1 Mod   PT General Charges $$ ACUTE PT VISIT: 1 Visit         02/07/2023  Jacinto Halim., PT Acute Rehabilitation Services [phone]  (office)  Rachel Clark 02/07/2023, 7:14 PM

## 2023-02-08 ENCOUNTER — Encounter (HOSPITAL_COMMUNITY): Payer: Medicare Other

## 2023-02-08 DIAGNOSIS — R0789 Other chest pain: Secondary | ICD-10-CM | POA: Diagnosis not present

## 2023-02-08 MED ORDER — PANTOPRAZOLE SODIUM 40 MG PO TBEC: 40.0000 mg | DELAYED_RELEASE_TABLET | Freq: Two times a day (BID) | ORAL | 1 refills | Status: DC

## 2023-02-08 MED ORDER — AMLODIPINE BESYLATE 5 MG PO TABS: 5.0000 mg | ORAL_TABLET | Freq: Every day | ORAL | 1 refills | Status: DC

## 2023-02-08 MED ORDER — PANTOPRAZOLE SODIUM 40 MG PO TBEC: 40.0000 mg | DELAYED_RELEASE_TABLET | Freq: Every day | ORAL | 1 refills | Status: DC

## 2023-02-08 MED ORDER — PANTOPRAZOLE SODIUM 40 MG PO TBEC
40.0000 mg | DELAYED_RELEASE_TABLET | Freq: Two times a day (BID) | ORAL | Status: DC
  Administered 2023-02-08: 40 mg via ORAL
  Filled 2023-02-08: qty 1

## 2023-02-08 MED ORDER — NYSTATIN 100000 UNIT/ML MT SUSP: 5.0000 mL | Freq: Four times a day (QID) | OROMUCOSAL | 0 refills | Status: AC

## 2023-02-08 NOTE — Discharge Summary (Addendum)
Discharge Summary    Patient ID: Rachel Clark MRN: [id number]; DOB: [birth year]  Admit date: 02/03/2023 Discharge date: 02/08/2023  PCP:  Laurann Montana, MD   [facility] Providers Cardiologist:  Peter Swaziland, MD     Discharge Diagnoses    Principal Problem:   Chest pain Active Problems:   Dyslipidemia   Iron deficiency anemia due to chronic blood loss   Sick sinus syndrome [facility])  Diagnostic Studies/Procedures    Lexiscan myoview  EGD _____________   History of Present Illness     Rachel Clark is a 72 y.o. female with  with CAD (remote NSTEMI s/p PCI to RCA, s/p CABG 2007, subsequent failure of SVG-RCA by cath in 2016, DESx3 - full metal jacket- to RCA in 11/2015, NSTEMI 03/2021 with DESx3 to ramus intermedius), NSTEMI  CHB/syncope s/p PPM in 03/2015, recurrent syncope/dizziness then felt due to orthostasis, GIB 03/2021 s/p transfusion with gastric antrum vessel s/p epi/clipping, HL with statin intolerance, colon CA s/p resection and chemotherapy, seizures, memory loss, chronic hyponatremia, fibromyalgia, GERD, hypothyroidism, former smoker  , NSTEMI 10/2021- s/p complex PCI to LCX. Who was seen 02/03/2023 for the evaluation of chest pain.   Per prior reports She was placed on Effient due to interaction of Brilinta with Tegretol and history of rash on Plavix.    Pt with increased recurrent chest pains- seen in clinic yesterday- referred for Outpatient CATH next week, is here now with chest pain- some improvement with NTG EKG NSR with frequent PVCs, trop negative, hb stable 8.7 but lower than before- prior h/o GIB   This evening started having 7/10 chest pain- pressure like pain, somewhat relieved with NTG but still persisted thus came to the ER. She was just sitting when CP started. Able to do ADLS but not a good historian, husband at bedside reports pt has some memory loss lately, able to walk only <1/2block. ER; Trop negative, EKG_ NSR with PVCs, BP  stable  Hospital Course     Consultants: GI   Chest Pain CAD s/p CABG, full metal jacket RCA, DESx3 to RI, and Lcx --  presented with chest pain for the last 2 weeks unrelieved by nitroglycerin, hsTn negative. Underwent [employer] which was low risk -- Ranexa was stopped 2/2 interaction with tegretol, started on amlodipine 5mg  daily -- continue ASA, Imdur, Repatha  Iron Deficiency Anemia -- Hx of GI bleed with gastric clipping and Colon CA s/p resection, Ferratin 11, Iron 7 -- Hgb 9.4 on admission, dropped as low as 8.4. Received 1 unit PRBCs 02/04/24, improved to 9.2 -- seen by GI and underwent EGD showing LA grade B esophagitis and gastric erosions. No evidence of active bleeding. No gastric ulcers. Biopsies not performed because of antiplatelet and anticoagulation use. Recommendations for protonix 40mg  daily along with nystatin for 10 days -- GI follow up in 2-3 months   SSS s/p PPM -- follows with EP  HTN -- continue amlodipine 5mg  daily  HLD -- on Repatha PTA  Hx of seizures -- on tegretol   General: Well developed, well nourished, female appearing in no acute distress. Head: Normocephalic, atraumatic.  Neck: Supple without bruits, JVD. Lungs:  Resp regular and unlabored, CTA. Heart: RRR, S1, S2, no S3, S4, or murmur; no rub. Abdomen: Soft, non-tender, non-distended with normoactive bowel sounds. No hepatomegaly. No rebound/guarding. No obvious abdominal masses. Extremities: No clubbing, cyanosis, edema. Distal pedal pulses are 2+ bilaterally.  Neuro: Alert and oriented X 3. Moves all extremities spontaneously.  Psych: Normal affect.   Seen by Dr. Allyson Sabal and deemed stable for discharge home. Follow up in the office arranged. Medications sent to pharmacy of choice. Educated by pharmD prior to discharge.    Did the patient have an acute coronary syndrome (MI, NSTEMI, STEMI, etc) this admission?:  No                               Did the patient have a percutaneous  coronary intervention (stent / angioplasty)?:  No.     _____________  Discharge Vitals Blood pressure (!) 150/76, pulse 86, temperature 97.8 F (36.6 C), temperature source Oral, resp. rate 18, height 5\' 8"  (1.727 m), weight 82.5 kg, SpO2 96%.  Filed Weights   02/03/23 1902 02/03/23 2202  Weight: 82.6 kg 82.5 kg    Labs & Radiologic Studies    CBC Recent Labs    02/06/23 0304 02/07/23 0743  WBC 8.2 7.7  HGB 9.5* 9.2*  HCT 29.6* 29.3*  MCV 74.9* 77.3*  PLT 335 330   Basic Metabolic Panel Recent Labs    40/98/11 0304 02/07/23 0743  NA 130* 131*  K 3.7 3.6  CL 99 100  CO2 23 20*  GLUCOSE 98 103*  BUN 14 16  CREATININE 0.88 0.74  CALCIUM 8.2* 8.0*   Liver Function Tests No results for input(s): "AST", "ALT", "ALKPHOS", "BILITOT", "PROT", "ALBUMIN" in the last 72 hours. No results for input(s): "LIPASE", "AMYLASE" in the last 72 hours. High Sensitivity Troponin:   Recent Labs  Lab 01/28/23 1832 01/28/23 2125 02/03/23 1901 02/03/23 2238 02/04/23 0020  TROPONINIHS 8 8 9 9 9     BNP Invalid input(s): "POCBNP" D-Dimer No results for input(s): "DDIMER" in the last 72 hours. Hemoglobin A1C No results for input(s): "HGBA1C" in the last 72 hours. Fasting Lipid Panel No results for input(s): "CHOL", "HDL", "LDLCALC", "TRIG", "CHOLHDL", "LDLDIRECT" in the last 72 hours. Thyroid Function Tests No results for input(s): "TSH", "T4TOTAL", "T3FREE", "THYROIDAB" in the last 72 hours.  Invalid input(s): "FREET3" _____________  NM Myocar Multi W/Spect W/Wall Motion / EF  Result Date: 02/06/2023 CLINICAL DATA:  Nonspecific chest pain EXAM: MYOCARDIAL IMAGING WITH SPECT (REST AND PHARMACOLOGIC-STRESS) GATED LEFT VENTRICULAR WALL MOTION STUDY LEFT VENTRICULAR EJECTION FRACTION TECHNIQUE: Standard myocardial SPECT imaging was performed after resting intravenous injection of 10.7 mCi Tc-44m Myoview. Subsequently, intravenous infusion of Lexiscan was performed under the  supervision of the Cardiology staff. At peak effect of the drug, 32.3 mCi Tc-63m Myoview was injected intravenously and standard myocardial SPECT imaging was performed. Quantitative gated imaging was also performed to evaluate left ventricular wall motion, and estimate left ventricular ejection fraction. COMPARISON:  None Available. FINDINGS: Perfusion: No decreased activity in the left ventricle on stress imaging to suggest reversible ischemia or infarction. Wall Motion: Normal left ventricular wall motion. No left ventricular dilation. Left Ventricular Ejection Fraction: 61% End diastolic volume 111 ml End systolic volume 44 ml IMPRESSION: 1. No reversible ischemia or infarction. 2. Normal left ventricular wall motion. 3. Left ventricular ejection fraction 61% 4. Non invasive risk stratification*: Low *2012 Appropriate Use Criteria for Coronary Revascularization Focused Update: J Am Coll Cardiol. 2012;59(9):857-881. http://content.dementiazones.com.aspx?articleid=1201161 Electronically Signed   By: Karen Kays M.D.   On: 02/06/2023 13:08   ECHOCARDIOGRAM COMPLETE  Result Date: 02/04/2023    ECHOCARDIOGRAM REPORT   Patient Name:   Rachel Clark Date of Exam: 02/04/2023 Medical Rec #:  [id number]  Height:       68.0 in Accession #:    [id number]        Weight:       181.8 lb Date of Birth:  Sep 30, 1950        BSA:          1.963 m Patient Age:    71 years          BP:           131/60 mmHg Patient Gender: F                 HR:           85 bpm. Exam Location:  Inpatient Procedure: 2D Echo, Cardiac Doppler and Color Doppler Indications:    Chest pain R07.9  History:        Patient has prior history of Echocardiogram examinations, most                 recent 11/12/2021. CAD and Previous Myocardial Infarction, Prior                 CABG and Pacemaker; Risk Factors:Hypertension, Dyslipidemia and                 Former Smoker.  Sonographer:    Dondra Prader RVT RCS Referring Phys: [id number] Elmon Kirschner  IMPRESSIONS  1. Left ventricular ejection fraction, by estimation, is 50 to 55%. The left ventricle has low normal function. The left ventricle demonstrates regional wall motion abnormalities (see scoring diagram/findings for description). Left ventricular diastolic  parameters are consistent with Grade I diastolic dysfunction (impaired relaxation).  2. Right ventricular systolic function is normal. The right ventricular size is normal. Tricuspid regurgitation signal is inadequate for assessing PA pressure.  3. The mitral valve is normal in structure. Mild to moderate mitral valve regurgitation. No evidence of mitral stenosis.  4. The aortic valve is tricuspid. Aortic valve regurgitation is not visualized. No aortic stenosis is present.  5. The inferior vena cava is normal in size with greater than 50% respiratory variability, suggesting right atrial pressure of 3 mmHg. FINDINGS  Left Ventricle: Left ventricular ejection fraction, by estimation, is 50 to 55%. The left ventricle has low normal function. The left ventricle demonstrates regional wall motion abnormalities. The left ventricular internal cavity size was normal in size. There is no left ventricular hypertrophy. Left ventricular diastolic parameters are consistent with Grade I diastolic dysfunction (impaired relaxation).  LV Wall Scoring: The basal inferior segment is akinetic. Right Ventricle: The right ventricular size is normal. No increase in right ventricular wall thickness. Right ventricular systolic function is normal. Tricuspid regurgitation signal is inadequate for assessing PA pressure. Left Atrium: Left atrial size was normal in size. Right Atrium: Right atrial size was normal in size. Pericardium: Trivial pericardial effusion is present. The pericardial effusion is circumferential. Mitral Valve: The mitral valve is normal in structure. Mild to moderate mitral valve regurgitation. No evidence of mitral valve stenosis. Tricuspid Valve: The  tricuspid valve is not well visualized. Tricuspid valve regurgitation is not demonstrated. No evidence of tricuspid stenosis. Aortic Valve: The aortic valve is tricuspid. Aortic valve regurgitation is not visualized. No aortic stenosis is present. Aortic valve mean gradient measures 3.2 mmHg. Aortic valve peak gradient measures 6.0 mmHg. Aortic valve area, by VTI measures 3.59 cm. Pulmonic Valve: The pulmonic valve was normal in structure. Pulmonic valve regurgitation is trivial. No evidence of pulmonic stenosis. Aorta: The aortic root is normal in size and  structure. Venous: The inferior vena cava is normal in size with greater than 50% respiratory variability, suggesting right atrial pressure of 3 mmHg. IAS/Shunts: No atrial level shunt detected by color flow Doppler.  LEFT VENTRICLE PLAX 2D LVIDd:         4.90 cm   Diastology LVIDs:         3.90 cm   LV e' medial:    9.57 cm/s LV PW:         1.10 cm   LV E/e' medial:  10.0 LV IVS:        1.10 cm   LV e' lateral:   12.50 cm/s LVOT diam:     2.50 cm   LV E/e' lateral: 7.6 LV SV:         76 LV SV Index:   39 LVOT Area:     4.91 cm  RIGHT VENTRICLE             IVC RV S prime:     10.40 cm/s  IVC diam: 1.50 cm TAPSE (M-mode): 2.5 cm LEFT ATRIUM             Index        RIGHT ATRIUM           Index LA diam:        4.10 cm 2.09 cm/m   RA Area:     12.10 cm LA Vol (A2C):   58.6 ml 29.83 ml/m  RA Volume:   29.30 ml  14.93 ml/m LA Vol (A4C):   49.9 ml 25.40 ml/m LA Biplane Vol: 57.8 ml 29.45 ml/m  AORTIC VALVE                    PULMONIC VALVE AV Area (Vmax):    3.64 cm     PV Vmax:       1.02 m/s AV Area (Vmean):   3.49 cm     PV Peak grad:  4.2 mmHg AV Area (VTI):     3.59 cm AV Vmax:           122.25 cm/s AV Vmean:          81.650 cm/s AV VTI:            0.211 m AV Peak Grad:      6.0 mmHg AV Mean Grad:      3.2 mmHg LVOT Vmax:         90.77 cm/s LVOT Vmean:        58.133 cm/s LVOT VTI:          0.154 m LVOT/AV VTI ratio: 0.73  AORTA Ao Asc diam: 3.40 cm  MITRAL VALVE MV Area (PHT): 4.55 cm    SHUNTS MV Decel Time: 167 msec    Systemic VTI:  0.15 m MV E velocity: 95.33 cm/s  Systemic Diam: 2.50 cm MV A velocity: 82.37 cm/s MV E/A ratio:  1.16 Vishnu Priya Mallipeddi Electronically signed by Winfield Rast Mallipeddi Signature Date/Time: 02/04/2023/3:48:59 PM    Final    DG Chest Portable 1 View  Result Date: 02/03/2023 CLINICAL DATA:  Chest pain EXAM: PORTABLE CHEST 1 VIEW COMPARISON:  01/28/2023 FINDINGS: Prior CABG. Left pacer remains in place, unchanged. Heart and mediastinal contours within normal limits. No confluent airspace opacities or effusions. No acute bony abnormality. IMPRESSION: No active disease. Electronically Signed   By: Charlett Nose M.D.   On: 02/03/2023 18:50   DG Chest 2 View  Result Date: 01/28/2023  CLINICAL DATA:  Chest pain since yesterday. EXAM: CHEST - 2 VIEW COMPARISON:  One-view chest x-ray 01/23/2023 FINDINGS: The heart size is upper limits of normal. Atherosclerotic changes are present at the aortic arch. Pacing wires are stable. Lung volumes are low. No edema or effusions are present. No focal airspace disease is present. The visualized soft tissues and bony thorax are unremarkable. IMPRESSION: No active cardiopulmonary disease. Electronically Signed   By: Marin Roberts M.D.   On: 01/28/2023 19:32   DG Chest Portable 1 View  Result Date: 01/23/2023 CLINICAL DATA:  Chest pain. EXAM: PORTABLE CHEST 1 VIEW COMPARISON:  Nov 10, 2021. FINDINGS: Stable cardiomediastinal silhouette. Left-sided pacemaker is unchanged in position. Both lungs are clear. The visualized skeletal structures are unremarkable. IMPRESSION: No active disease. Electronically Signed   By: Lupita Raider M.D.   On: 01/23/2023 09:58   Disposition   Pt is being discharged home today in good condition.  Follow-up Plans & Appointments     Follow-up Information     Charlott Rakes, MD. Schedule an appointment as soon as possible for a visit in 2  month(s).   Specialty: Gastroenterology Why: Follow-up for GI bleed and to schedule outpatient colonoscopy Contact information: [address] [place] [address] [phone]         Joylene Grapes, NP Follow up on 02/14/2023.   Specialties: Cardiology, Family Medicine Why: at 8:25am for your follow up appt with cardiology Contact information: [address] [place] [address] [phone]                 Discharge Medications   Allergies as of 02/08/2023       Reactions   Sulfamethoxazole Anaphylaxis   Don't recall   Aspirin Other (See Comments)   GI upset- can tolerate 81 mg ASA, just not full doses   Crestor [rosuvastatin]    myalgias   Erythromycin Nausea Only   Gi upset   Lisinopril Cough   Niacin And Related Other (See Comments)   Don't recall   Penicillin G Benzathine    Other reaction(s): Unknown   Statins Other (See Comments)   Muscle pain   Sulfa Drugs Cross Reactors Swelling   Don't recall   Tetracyclines & Related Nausea Only   Xeloda [capecitabine] Diarrhea   Penicillins Nausea Only, Rash      Plavix [clopidogrel Bisulfate] Rash        Medication List     STOP taking these medications    ranolazine 500 MG 12 hr tablet Commonly known as: RANEXA       TAKE these medications    acetaminophen 500 MG tablet Commonly known as: TYLENOL Take 500-1,000 mg by mouth daily as needed for mild pain or headache.   amLODipine 5 MG tablet Commonly known as: NORVASC Take 1 tablet (5 mg total) by mouth daily.   aspirin 81 MG chewable tablet Chew 1 tablet (81 mg total) by mouth daily.   Azelastine HCl 0.15 % Soln Place 1 spray into both nostrils daily as needed (seasonal allergies).   calcium carbonate 500 MG chewable tablet Commonly known as: TUMS - dosed in mg elemental calcium Chew 500 mg by mouth daily.   carbamazepine 200 MG 12 hr tablet Commonly known as: TEGRETOL XR Take 3 tablets (600 mg total)  by mouth 2 (two) times daily.   diphenhydrAMINE 25 MG tablet Commonly known as: BENADRYL Take 25 mg by mouth daily as needed for allergies.  diphenhydramine-acetaminophen 25-500 MG Tabs tablet Commonly known as: TYLENOL PM Take 1 tablet by mouth at bedtime as needed (Sleep).   DULoxetine 60 MG capsule Commonly known as: CYMBALTA Take 60 mg by mouth 2 (two) times daily.   ethosuximide 250 MG capsule Commonly known as: ZARONTIN TAKE 2 CAPSULES IN THE MORNING, TAKE 2 CAPSULES AT NIGHT What changed: See the new instructions.   ferrous sulfate 325 (65 FE) MG EC tablet Take 325 mg by mouth daily with breakfast.   fluticasone 50 MCG/ACT nasal spray Commonly known as: FLONASE Place 1 spray into both nostrils daily as needed for allergies.   isosorbide mononitrate 30 MG 24 hr tablet Commonly known as: IMDUR TAKE HALF TABLET (15MG  TOTAL) BY MOUTH EVERY DAY What changed: See the new instructions.   levothyroxine 50 MCG tablet Commonly known as: SYNTHROID Take 50 mcg by mouth daily before breakfast.   memantine 10 MG tablet Commonly known as: NAMENDA Take 1 tablet (10 mg total) by mouth 2 (two) times daily.   Nexlizet 180-10 MG Tabs Generic drug: Bempedoic Acid-Ezetimibe Take 180 mg by mouth daily.   nitroGLYCERIN 0.4 MG SL tablet Commonly known as: NITROSTAT place 1 tablet under the tongue every 5 minutes as needed for chest pain What changed: See the new instructions.   nystatin 100000 UNIT/ML suspension Commonly known as: MYCOSTATIN Take 5 mLs (500,000 Units total) by mouth 4 (four) times daily for 9 days.   pantoprazole 40 MG tablet Commonly known as: PROTONIX Take 1 tablet (40 mg total) by mouth daily. What changed: See the new instructions.   prasugrel 10 MG Tabs tablet Commonly known as: EFFIENT Take 1 tablet (10 mg total) by mouth daily.   Repatha SureClick 140 MG/ML Soaj Generic drug: Evolocumab Inject 140 mg into the skin every 14 (fourteen) days.    THERMOTABS PO Take 452 mg by mouth daily. Thermotabs 452 mg sodium chloride   Vitamin D 50 MCG (2000 UT) Caps Take 4,000 Units by mouth daily.        Outstanding Labs/Studies   CBC at follow up appt  Duration of Discharge Encounter   Greater than 30 minutes including physician time.  Signed, Laverda Page, NP 02/08/2023, 10:51 AM  Agree with note by Laverda Page NP-C  Pt admitted with atypical CP that was relieved with blood transfusion. HGB has remained stable. Lexi low risk and non ischemic. Known CAD. Stable for DC. Will arrange TOC7 then ROV with Dr. Swaziland  Shubh Chiara J. Eliseo Withers, M.D., FACP, [facility], [facility] [address] [place], [place]  [address]  [phone] 02/08/2023 1:43 PM

## 2023-02-08 NOTE — TOC Initial Note (Signed)
Transition of Care [facility]) - Initial/Assessment Note    Patient Details  Name: Rachel Clark MRN: [id number] Date of Birth: 04/15/1951  [facility] CM/SW Contact:    Gala Lewandowsky, RN Phone Number: 02/08/2023, 11:27 AM  Clinical Narrative: Patient presented for chest pain-post LHC. PTA patient was from home with spouse. Plan will be for home with [facility]. Case Manager discussed the Medicare.gov list and spouse has used [facility] in the past and wants to use them. Case Manager submitted referral to [facility] and start of care to begin within 24-48 hours post transition of care needs.             Expected Discharge Plan: Home w Home Health Services Barriers to Discharge: No Barriers Identified   Patient Goals and CMS Choice Patient states their goals for this hospitalization and ongoing recovery are:: to return home.   Choice offered to / list presented to : NA    Expected Discharge Plan and Services In-house Referral: NA Discharge Planning Services: CM Consult Post Acute Care Choice: Home Health Living arrangements for the past 2 months: Single Family Home Expected Discharge Date: 02/08/23                 DME Agency: NA       HH Arranged: PT HH Agency: [facility]) Date HH Agency Contacted: 02/08/23 Time HH Agency Contacted: 1127 Representative spoke with at [facility] Agency: Morrie Sheldon  Prior Living Arrangements/Services Living arrangements for the past 2 months: Single Family Home Lives with:: Spouse Patient language and need for interpreter reviewed:: Yes Do you feel safe going back to the place where you live?: Yes      Need for Family Participation in Patient Care: Yes (Comment) Care giver support system in place?: Yes (comment)   Criminal Activity/Legal Involvement Pertinent to Current Situation/Hospitalization: No - Comment as needed  Activities of Daily Living Home Assistive Devices/Equipment: Bedside  commode/3-in-1, Blood pressure cuff, Shower chair with back, Cane (specify quad or straight), Eyeglasses, Grab bars around toilet, Grab bars in shower, Scales, Raised toilet seat with rails, Walker (specify type) (standard walker) ADL Screening (condition at time of admission) Patient's cognitive ability adequate to safely complete daily activities?: Yes Is the patient deaf or have difficulty hearing?: No Does the patient have difficulty seeing, even when wearing glasses/contacts?: No Does the patient have difficulty concentrating, remembering, or making decisions?: No Patient able to express need for assistance with ADLs?: Yes Does the patient have difficulty dressing or bathing?: No Independently performs ADLs?: Yes (appropriate for developmental age) Does the patient have difficulty walking or climbing stairs?: No Weakness of Legs: None Weakness of Arms/Hands: None  Permission Sought/Granted Permission sought to share information with : Family Supports, Magazine features editor, Case Estate manager/land agent granted to share information with : Yes, Verbal Permission Granted     Permission granted to share info w AGENCY: [facility].        Emotional Assessment Appearance:: Appears stated age Attitude/Demeanor/Rapport: Engaged Affect (typically observed): Appropriate Orientation: : Oriented to Self, Oriented to Place, Oriented to  Time, Oriented to Situation Alcohol / Substance Use: Not Applicable Psych Involvement: No (comment)  Admission diagnosis:  Chest pain [R07.9] Chest pain, unspecified type [R07.9] Patient Active Problem List   Diagnosis Date Noted   Right leg pain 01/13/2022   Hypocalcemia 01/13/2022   NSTEMI (non-ST elevated myocardial infarction) (HCC) 11/11/2021   Presence of stent in left circumflex coronary artery    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    Non-ST elevation (NSTEMI) myocardial infarction (HCC) 11/10/2021   Acute upper GI bleed 03/13/2021    GI bleed 03/13/2021   Preventive measure 01/14/2021   Orthostatic hypotension 12/21/2020   Memory change 11/13/2016   Acute bronchitis 03/23/2016   Chest pain with high risk for cardiac etiology 11/05/2015   Essential hypertension 08/25/2015   Sick sinus syndrome (HCC) 08/16/2015   Syncope and collapse 08/10/2015   CAD, multiple vessel    Chest pain    Cardiac pacemaker in situ 07/25/2015   Angina pectoris (HCC) 03/24/2015   Unstable angina (HCC) 10/26/2014   Seizures (HCC) 02/19/2014   Lung nodule 02/06/2013   Thyroid nodule 02/06/2013   History of colon cancer 12/04/2011   Iron deficiency anemia due to chronic blood loss 11/17/2011   GERD (gastroesophageal reflux disease) 08/17/2011   Monoclonal gammopathy of unknown significance    Hypothyroid    Coronary artery disease, post CABG 2007     Dyslipidemia    PCP:  Laurann Montana, MD Pharmacy:   [facility] [phone] [place], [place] - [address] [place] [address] Phone: [phone] Fax: [phone]  Social Determinants of Health (SDOH) Social History: SDOH Screenings   Food Insecurity: No Food Insecurity (02/03/2023)  Housing: Low Risk  (02/03/2023)  Transportation Needs: No Transportation Needs (02/03/2023)  Utilities: Not At Risk (02/03/2023)  Depression (PHQ2-9): Low Risk  (06/22/2022)  Tobacco Use: Medium Risk (02/03/2023)   Readmission Risk Interventions     No data to display

## 2023-02-09 ENCOUNTER — Ambulatory Visit: Payer: Medicare Other | Admitting: Nurse Practitioner

## 2023-02-11 ENCOUNTER — Encounter (HOSPITAL_COMMUNITY): Payer: Self-pay | Admitting: Gastroenterology

## 2023-02-12 ENCOUNTER — Telehealth: Payer: Self-pay | Admitting: Cardiology

## 2023-02-12 DIAGNOSIS — D472 Monoclonal gammopathy: Secondary | ICD-10-CM | POA: Diagnosis not present

## 2023-02-12 DIAGNOSIS — D500 Iron deficiency anemia secondary to blood loss (chronic): Secondary | ICD-10-CM | POA: Diagnosis not present

## 2023-02-12 DIAGNOSIS — I951 Orthostatic hypotension: Secondary | ICD-10-CM | POA: Diagnosis not present

## 2023-02-12 DIAGNOSIS — E039 Hypothyroidism, unspecified: Secondary | ICD-10-CM | POA: Diagnosis not present

## 2023-02-12 DIAGNOSIS — Z604 Social exclusion and rejection: Secondary | ICD-10-CM | POA: Diagnosis not present

## 2023-02-12 DIAGNOSIS — Z7951 Long term (current) use of inhaled steroids: Secondary | ICD-10-CM | POA: Diagnosis not present

## 2023-02-12 DIAGNOSIS — Z85038 Personal history of other malignant neoplasm of large intestine: Secondary | ICD-10-CM | POA: Diagnosis not present

## 2023-02-12 DIAGNOSIS — M797 Fibromyalgia: Secondary | ICD-10-CM | POA: Diagnosis not present

## 2023-02-12 DIAGNOSIS — E785 Hyperlipidemia, unspecified: Secondary | ICD-10-CM | POA: Diagnosis not present

## 2023-02-12 DIAGNOSIS — Z951 Presence of aortocoronary bypass graft: Secondary | ICD-10-CM | POA: Diagnosis not present

## 2023-02-12 DIAGNOSIS — E871 Hypo-osmolality and hyponatremia: Secondary | ICD-10-CM | POA: Diagnosis not present

## 2023-02-12 DIAGNOSIS — K219 Gastro-esophageal reflux disease without esophagitis: Secondary | ICD-10-CM | POA: Diagnosis not present

## 2023-02-12 DIAGNOSIS — I252 Old myocardial infarction: Secondary | ICD-10-CM | POA: Diagnosis not present

## 2023-02-12 DIAGNOSIS — I495 Sick sinus syndrome: Secondary | ICD-10-CM | POA: Diagnosis not present

## 2023-02-12 DIAGNOSIS — F32A Depression, unspecified: Secondary | ICD-10-CM | POA: Diagnosis not present

## 2023-02-12 DIAGNOSIS — G40409 Other generalized epilepsy and epileptic syndromes, not intractable, without status epilepticus: Secondary | ICD-10-CM | POA: Diagnosis not present

## 2023-02-12 DIAGNOSIS — G8929 Other chronic pain: Secondary | ICD-10-CM | POA: Diagnosis not present

## 2023-02-12 DIAGNOSIS — Z87891 Personal history of nicotine dependence: Secondary | ICD-10-CM | POA: Diagnosis not present

## 2023-02-12 DIAGNOSIS — I10 Essential (primary) hypertension: Secondary | ICD-10-CM | POA: Diagnosis not present

## 2023-02-12 DIAGNOSIS — M549 Dorsalgia, unspecified: Secondary | ICD-10-CM | POA: Diagnosis not present

## 2023-02-12 DIAGNOSIS — Z95810 Presence of automatic (implantable) cardiac defibrillator: Secondary | ICD-10-CM | POA: Diagnosis not present

## 2023-02-12 DIAGNOSIS — Z7982 Long term (current) use of aspirin: Secondary | ICD-10-CM | POA: Diagnosis not present

## 2023-02-12 DIAGNOSIS — I2510 Atherosclerotic heart disease of native coronary artery without angina pectoris: Secondary | ICD-10-CM | POA: Diagnosis not present

## 2023-02-12 NOTE — Telephone Encounter (Signed)
Returned a call to pt's wife and it went straight to VM. LVM to call back so that we can clarify the need for these labs and who wants them drawn.

## 2023-02-12 NOTE — Telephone Encounter (Signed)
Patient's husband is requesting for [place] to put in a lab order to check the patient's hemoglobin levels. Patient's husband stated they will come at 8 AM to get blood drawn tomorrow unless told otherwise. Please advise.

## 2023-02-12 NOTE — Telephone Encounter (Signed)
Called patient left Dr.Jordan's advice on personal voice mail.

## 2023-02-14 ENCOUNTER — Ambulatory Visit: Payer: Medicare Other | Attending: Nurse Practitioner | Admitting: Nurse Practitioner

## 2023-02-14 ENCOUNTER — Encounter: Payer: Self-pay | Admitting: Nurse Practitioner

## 2023-02-14 VITALS — BP 130/72 | HR 83 | Ht 68.0 in | Wt 179.2 lb

## 2023-02-14 DIAGNOSIS — E785 Hyperlipidemia, unspecified: Secondary | ICD-10-CM | POA: Diagnosis not present

## 2023-02-14 DIAGNOSIS — E039 Hypothyroidism, unspecified: Secondary | ICD-10-CM | POA: Diagnosis not present

## 2023-02-14 DIAGNOSIS — I2510 Atherosclerotic heart disease of native coronary artery without angina pectoris: Secondary | ICD-10-CM | POA: Diagnosis not present

## 2023-02-14 DIAGNOSIS — I495 Sick sinus syndrome: Secondary | ICD-10-CM | POA: Diagnosis not present

## 2023-02-14 DIAGNOSIS — I25118 Atherosclerotic heart disease of native coronary artery with other forms of angina pectoris: Secondary | ICD-10-CM | POA: Insufficient documentation

## 2023-02-14 DIAGNOSIS — Z950 Presence of cardiac pacemaker: Secondary | ICD-10-CM | POA: Diagnosis not present

## 2023-02-14 DIAGNOSIS — I951 Orthostatic hypotension: Secondary | ICD-10-CM | POA: Insufficient documentation

## 2023-02-14 DIAGNOSIS — Z8719 Personal history of other diseases of the digestive system: Secondary | ICD-10-CM | POA: Insufficient documentation

## 2023-02-14 NOTE — Progress Notes (Addendum)
Office Visit    Patient Name: Rachel Clark Date of Encounter: 02/14/2023  Primary Care Provider:  Laurann Montana, MD Primary Cardiologist:  Peter Swaziland, MD  Chief Complaint    72 year old female with a history of CAD s/p remote PCI-RCA, CABG in 2007, DES x 3 (full metal jacket)-RCA in 2017, DES x 3-RI in 2022, PCI-LCx in 2023, CHB s/p PPM in 2016, recurrent syncope, orthostatic hypotension, GI bleed, hyperlipidemia with statin intolerance, colon cancer s/p resection and chemotherapy, chronic hyponatremia, seizures, hypothyroidism, fibromyalgia, memory loss, GERD who presents for hospital follow-up related to CAD.  Past Medical History    Past Medical History:  Diagnosis Date   Arthritis    "fingers" (06/29/2016)   Chronic lower back pain    Colon cancer (HCC) 12/04/2011   s/p Laparoscopic-assisted transverse colectomy on 12/19/2011 by Dr. Dwain Sarna.  pT3 N0 M0.    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   last seizure was in 1972 ;takes Tegretol (06/29/2016)    Headache    "weekly" (06/29/2016)   Hyperlipidemia    Hyponatremia    Hypothyroid    Iron deficiency anemia 11/17/2011   Memory change 11/13/2016   Monoclonal gammopathy of unknown significance    Orthostatic hypotension    Presence of permanent cardiac pacemaker    Syncope and collapse    pacemaker implanted   Past Surgical History:  Procedure Laterality Date   ANTERIOR CERVICAL DECOMP/DISCECTOMY FUSION  1980's   BACK SURGERY     CARDIAC CATHETERIZATION  05/20/2009   obstructive native vessel disease in LAD, RCA, and first diagonal, patent vein graft to distal RCA and LIMA to LAD,normal. ef 60%   CARDIAC CATHETERIZATION N/A 03/24/2015   Procedure: Left Heart Cath and Cors/Grafts Angiography;  Surgeon: Iran Ouch, MD;   Location: [facility] INVASIVE CV LAB;  Service: Cardiovascular;  Laterality: N/A;   CARDIAC CATHETERIZATION N/A 07/28/2015   Procedure: Left Heart Cath and Coronary Angiography;  Surgeon: Lennette Bihari, MD;  Location: [facility] INVAS[facility] CV [facility];  Service: Cardiovascular;  Laterality: N/A;   CARDIAC CATHETERIZATION N/A 11/09/2015   Procedure: Coronary Stent Intervention;  Surgeon: Tonny Bollman, MD;  Location: [facility] INVASIVE CV LAB;  Service: Cardiovascular;  Laterality: N/A;   CARDIAC CATHETERIZATION N/A 11/09/2015   Procedure: Left Heart Cath and Coronary Angiography;  Surgeon: Tonny Bollman, MD;  Location: [facility] INVASIVE CV LAB;  Service: Cardiovascular;  Laterality: N/A;   CARDIAC CATHETERIZATION N/A 06/29/2016   Procedure: Left Heart Cath and Coronary Angiography;  Surgeon: Yvonne Kendall, MD;  Location: [facility] INVASIVE CV LAB;  Service: Cardiovascular;  Laterality: N/A;   CARDIAC CATHETERIZATION N/A 06/29/2016   Procedure: Intravascular Pressure Wire/FFR Study;  Surgeon: Yvonne Kendall, MD;  Location: [facility] INVASIVE CV LAB;  Service: Cardiovascular;  Laterality: N/A;   CARDIAC CATHETERIZATION N/A 06/29/2016   Procedure: Coronary Balloon Angioplasty;  Surgeon: Yvonne Kendall, MD;  Location: [facility] INVAS[facility] CV [facility];  Service: Cardiovascular;  Laterality: N/A;   COLON RESECTION  12/19/2011   Procedure: COLON RESECTION LAPAROSCOPIC;  Surgeon: Emelia Loron, MD;  Location: [facility] OR;  Service: General;  Laterality: N/A;  laparoscopic hand assisted partial colon resection   COLON SURGERY     COLONOSCOPY     CORONARY ANGIOPLASTY WITH STENT PLACEMENT  ~  2007   1 stent   CORONARY ARTERY BYPASS GRAFT  2007   "CABG X2"   DILATION AND CURETTAGE OF UTERUS  1973   EP IMPLANTABLE DEVICE N/A 03/25/2015   MDT Advisa DR pacemaker implanted by Dr Johney Frame for transient complete heart block and syncope   ESOPHAGOGASTRODUODENOSCOPY (EGD) WITH PROPOFOL N/A 03/14/2021   Procedure: ESOPHAGOGASTRODUODENOSCOPY (EGD) WITH PROPOFOL;  Surgeon: Kerin Salen, MD;  Location: [facility] ENDOSCOPY;  Service: Gastroenterology;  Laterality: N/A;   ESOPHAGOGASTRODUODENOSCOPY (EGD) WITH PROPOFOL N/A 11/13/2021   Procedure: ESOPHAGOGASTRODUODENOSCOPY (EGD) WITH PROPOFOL;  Surgeon: Kathi Der, MD;  Location: [facility] ENDOSCOPY;  Service: Gastroenterology;  Laterality: N/A;   ESOPHAGOGASTRODUODENOSCOPY (EGD) WITH PROPOFOL N/A 02/07/2023   Procedure: ESOPHAGOGASTRODUODENOSCOPY (EGD) WITH PROPOFOL;  Surgeon: Kathi Der, MD;  Location: [facility] ENDOSCOPY;  Service: Gastroenterology;  Laterality: N/A;   HEMOSTASIS CLIP PLACEMENT  03/14/2021   Procedure: HEMOSTASIS CLIP PLACEMENT;  Surgeon: Kerin Salen, MD;  Location: [facility] ENDOSCOPY;  Service: Gastroenterology;;   INSERT / REPLACE / REMOVE PACEMAKER     LEFT HEART CATH AND CORONARY ANGIOGRAPHY N/A 11/11/2021   Procedure: LEFT HEART CATH AND CORONARY ANGIOGRAPHY;  Surgeon: Marykay Lex, MD;  Location: [facility] INVASIVE CV LAB;  Service: Cardiovascular;  Laterality: N/A;   LEFT HEART CATH AND CORS/GRAFTS ANGIOGRAPHY N/A 02/21/2018   Procedure: LEFT HEART CATH AND CORS/GRAFTS ANGIOGRAPHY;  Surgeon: Runell Gess, MD;  Location: [facility] INVASIVE CV LAB;  Service: Cardiovascular;  Laterality: N/A;   LEFT HEART CATH AND CORS/GRAFTS ANGIOGRAPHY N/A 03/08/2021   Procedure: LEFT HEART CATH AND CORS/GRAFTS ANGIOGRAPHY;  Surgeon: Swaziland, Peter M, MD;  Location: [facility] INVASIVE CV LAB;  Service: Cardiovascular;  Laterality: N/A;   PORT-A-CATH REMOVAL N/A 06/12/2013   Procedure: REMOVAL PORT-A-CATH;  Surgeon: Emelia Loron, MD;  Location: [facility];  Service: General;  Laterality: N/A;   PORTACATH PLACEMENT  06/04/2012   Procedure: INSERTION PORT-A-CATH;  Surgeon: Emelia Loron, MD;  Location: [facility] OR;  Service: General;  Laterality: N/A;  Insertion of port-a-cath    POSTERIOR LAMINECTOMY / DECOMPRESSION CERVICAL SPINE  1990s   SCLEROTHERAPY  03/14/2021   Procedure: SCLEROTHERAPY;  Surgeon: Kerin Salen, MD;  Location: [facility]  ENDOSCOPY;  Service: Gastroenterology;;   VAGINAL HYSTERECTOMY      Allergies  Allergies  Allergen Reactions   Sulfamethoxazole Anaphylaxis    Don't recall   Aspirin Other (See Comments)    GI upset- can tolerate 81 mg ASA, just not full doses   Crestor [Rosuvastatin]     myalgias   Erythromycin Nausea Only    Gi upset   Lisinopril Cough   Niacin And Related Other (See Comments)    Don't recall   Penicillin G Benzathine     Other reaction(s): Unknown   Statins Other (See Comments)    Muscle pain   Sulfa Drugs Cross Reactors Swelling    Don't recall   Tetracyclines & Related Nausea Only   Xeloda [Capecitabine] Diarrhea   Penicillins Nausea Only and Rash        Plavix [Clopidogrel Bisulfate] Rash     Labs/Other Studies Reviewed    The following studies were reviewed today:  Cardiac Studies & Procedures   CARDIAC CATHETERIZATION  CARDIAC CATHETERIZATION 11/11/2021  Narrative   Ost LAD to Prox LAD lesion is 60% stenosed. Prox LAD lesion is 100% stenosed.   LIMA-graft was visualized by angiography and is normal in caliber.  The graft exhibits no disease.   SVG-dRCA: Origin lesion is 100% stenosed.  graft was  not visualized due to known occlusion.   Ost RCA to Mid RCA overlapping full metal jacket DES stents, widely patent.   Ost 1st Diag to 1st Diag lesion is 85% stenosed.   ----------------------   Lesion segment #2 (all in-stent restenosis) Ost Ramus to Ramus lesion is 75% stenosed.  Ramus-2 lesion is 85% stenosed.  Ramus-1 lesion is 35% stenosed.   Scoring balloon angioplasty was performed using a BALLN SCOREFLEX 2.50X15 -> followed by post dilation with a [place] Balloon 3.0 mm x 15 mm   Post intervention, there is a 20% residual stenosis  in the proximal portion of stent segment.  Post intervention, there is a 0% residual stenosis in the distal portion of the stent.   CULPRIT LESION #1: Ramus-3 lesion is 95% stenosed.-2 tandem locations of calcified stenosis.   Scoring  balloon angioplasty was performed using a BALLN SCOREFLEX 2.50X15.   A drug-eluting stent was successfully placed (overlapping the distal stent) using a STENT ONYX FRONTIER 2.5X30 -> tapered postdilation from overlap 3.1 mm down to 2.8 mm distally.   Post intervention, there is a 0% residual stenosis.   ------------------------------------------------------------------------------   LV end diastolic pressure is normal.   There is no aortic valve stenosis.  SUMMARY Severe Native vessel CAD: stable severe proximal to mid LAD disease with LAD occlusion and patent LIMA-LAD, widely patent full metal jacket stented RCA, diffuse severe in-stent restenosis throughout the proximal and midportion of the stented segment of the native LCx with severe eccentric calcified lesions distal to the stent. Difficult/complex but successful scoring balloon angioplasty with shockwave lithotripsy followed by distal overlapping DES stent placement (Onyx Frontier DES 2.5 x 30 tapered postdilation from 3.1 in the overlap segment to 2.8 distally) Scoreflex angioplasty followed by post dilation with 3.0 mm [place] balloon throughout the entire stented segment of the LCx reducing stenosis in the midportion to 10% and more proximally to 20%. Normal LVEDP  RECOMMENDATION Monitor overnight, anticipate discharge in the morning. Aggressive risk factor modification Essentially lifelong finding.  In coverage with DAPT x1 year    Bryan Lemma, MD  Findings Coronary Findings Diagnostic  Dominance: Right  Left Main Vessel was injected.  Left Anterior Descending Ost LAD to Prox LAD lesion is 60% stenosed. Prox LAD lesion is 100% stenosed.  First Diagonal Branch Ost 1st Diag to 1st Diag lesion is 85% stenosed. The lesion is segmental.  Ramus Intermedius Vessel is large. Ost Ramus to Ramus lesion is 75% stenosed. The lesion is severely calcified. The lesion was previously treated using a drug eluting stent between 6-12  months ago. Previously placed stent displays restenosis. Ramus-1 lesion is 35% stenosed. The lesion was previously treated using a drug eluting stent between 6-12 months ago. Previously placed stent displays restenosis. Ramus-2 lesion is 85% stenosed. The lesion is calcified. The lesion was previously treated using a drug eluting stent between 6-12 months ago. Previously placed stent displays restenosis. Ramus-3 lesion is 95% stenosed. Vessel is the culprit lesion. The lesion is eccentric, irregular and ulcerative. The lesion is severely calcified.  Left Circumflex Vessel is small. There is severe diffuse disease throughout the vessel.  Right Coronary Artery Vessel was injected. Vessel is normal in caliber. Vessel is angiographically normal. The vessel is moderately tortuous. Proximal to distal stents patent. Non-stenotic Ost RCA to Mid RCA lesion was previously treated.  Graft To Dist RCA Graft was not visualized due to known occlusion. Origin lesion is 100% stenosed.  LIMA LIMA Graft To Mid LAD LIMA graft was  visualized by angiography and is normal in caliber.  The graft exhibits no disease.  Intervention  Ost Ramus to Ramus lesion Angioplasty Lesion length:  15 mm. CATH LAUNCHER 54FR EBU3.5 guide catheter was inserted. WIRE RUNTHROUGH .[id number] guidewire used to cross lesion. Drug-coated balloon angioplasty was performed using a BALLN SCOREFLEX 2.50X10. Maximum pressure: 16 atm. Inflation time: 30 sec. Final postdilation with BALL SAPPHIRE NC24 3.0X15 -16 ATM x 30sec Post-Intervention Lesion Assessment The intervention was successful. Pre-interventional TIMI flow is 3. Post-intervention TIMI flow is 3. Treated lesion length:  20 mm. No complications occurred at this lesion. There is a 20% residual stenosis post intervention.  Ramus-1 lesion Angioplasty CATH LAUNCHER 54FR EBU3.5 guide catheter was inserted. Scoring balloon angioplasty was performed using a BALLN SCOREFLEX 2.50X15. BALL  SAPPHIRE NC24 3.0X15 16 ATM x 20 sec postdilation Post-Intervention Lesion Assessment The intervention was successful. Pre-interventional TIMI flow is 3. Post-intervention TIMI flow is 3. Treated lesion length:  30 mm. No complications occurred at this lesion. There is a 0% residual stenosis post intervention.  Ramus-2 lesion Angioplasty Lesion length:  20 mm. Scoring balloon angioplasty was performed using a BALLN SCOREFLEX 2.50X15. Maximum pressure: 14 atm. Inflation time: 30 sec. Final postdilation including stent overlap segment with BALL SAPPHIRE NC24 3.0X15 16 ATM x 30 sec Stent (Also treats lesions: Ramus-3) Lesion length:  26 mm. CATH LAUNCHER 54FR EBU3.5 guide catheter was inserted. Lesion crossed with guidewire using a WIRE RUNTHROUGH .[id number]. Pre-stent angioplasty was performed using a BALLN SCOREFLEX 2.50X15. Maximum pressure:  14 atm. Inflation time:  20 sec. A drug-eluting stent was successfully placed using a STENT ONYX FRONTIER 2.5X30. Maximum pressure: 16 atm. Inflation time: 30 sec. Stent strut is well apposed. The stent balloon was pulled back to the overlapping segment and inflated to high atmospheres to flare the ostium and allow for postdilation balloon.  Tapered postdilation from 3.1 at the overlap to 2.8 distally. Stent overlaps previously placed stent. Post-stent angioplasty was performed using a BALL SAPPHIRE NC24 3.0X15. Maximum pressure:  16 atm. Inflation time:  20 sec. Proximal 2/3 of stent CATHETER GUIDEZILLA II 54F had previously been advanced over the balloons to allow for stent passage Post-Intervention Lesion Assessment The intervention was successful. Pre-interventional TIMI flow is 3. Post-intervention TIMI flow is 3. Treated lesion length:  20 mm. No complications occurred at this lesion. There is a 0% residual stenosis post intervention.  Ramus-3 lesion Angioplasty CATHETER GUIDEZILLA II 54F guide catheter was inserted. Scoring balloon angioplasty was performed  using a BALLN SCOREFLEX 2.50X10. Maximum pressure: 14 atm. Inflation time: 30 sec. Initially, 2.5 mm x 12 mm balloon was used to allow for passage of distal balloons.  This was followed by a 2.5 x 1210 mm core flex balloon with several inflations, the still revealed eccentric calcified plaque. With the use of a Godzilla guide extension catheter, a 2.5 mm x 10 mm shockwave lithotripsy balloon was advanced to the lesion.  1 round of shocks was delivered with significant adjustment in the lesion.  Following this the 2.5 mm score flex was advanced and reinflated with evidence of full balloon expansion. Stent (Also treats lesions: Ramus-2) See details in Ramus-2 lesion. Post-Intervention Lesion Assessment The intervention was successful. Pre-interventional TIMI flow is 3. Post-intervention TIMI flow is 3. Treated lesion length:  28 mm. No complications occurred at this lesion. There is a 0% residual stenosis post intervention.   CARDIAC CATHETERIZATION  CARDIAC CATHETERIZATION 03/08/2021  Narrative   Prox LAD lesion is 100% stenosed.  Origin lesion is 100% stenosed.   Ost LAD to Prox LAD lesion is 60% stenosed.   Ost 1st Diag to 1st Diag lesion is 85% stenosed.   Ramus lesion is 99% stenosed.   Ost Ramus to Ramus lesion is 90% stenosed.   Non-stenotic Ost RCA to Mid RCA lesion was previously treated.   A drug-eluting stent was successfully placed using a STENT ONYX FRONTIER 3.0X15.   A drug-eluting stent was successfully placed using a SYNERGY XD 3.0X16.   A drug-eluting stent was successfully placed using a SYNERGY XD 3.0X20.   Post intervention, there is a 0% residual stenosis.   Post intervention, there is a 0% residual stenosis.   The left ventricular systolic function is normal.   LV end diastolic pressure is normal.   The left ventricular ejection fraction is 55-65% by visual estimate.   The radiation dose exceeded thresholds defined in the "Patient Radiation Dose Management For  Interventional Medical Procedures With Extensive Use of Fluoroscopy" policy. Specific follow up instructions will be provided to the patient prior to discharge.  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 This is the culprit Normal LV function Normal LVEDP Successful PCI of the Ramus intermediate with DES x 3 in overlapping fashion.  Plan: DAPT for one year. Anticipate possible DC tomorrow.  Findings Coronary Findings Diagnostic  Dominance: Right  Left Anterior Descending Ost LAD to Prox LAD lesion is 60% stenosed. Prox LAD lesion is 100% stenosed.  First Diagonal Branch Ost 1st Diag to 1st Diag lesion is 85% stenosed. The lesion is segmental.  Ramus Intermedius Vessel is large. Ost Ramus to Ramus lesion is 90% stenosed. The lesion is severely calcified. Ramus lesion is 99% stenosed. The lesion is calcified.  Left Circumflex Vessel is small.  Right Coronary Artery Non-stenotic Ost RCA to Mid RCA lesion was previously treated.  Graft To Dist RCA Origin lesion is 100% stenosed.  LIMA Graft To Mid LAD  Intervention  Ost Ramus to Ramus lesion Stent CATH LAUNCHER 6FR EBU3.5 guide catheter was inserted. Lesion crossed with guidewire using a WIRE ASAHI PROWATER 180CM. Pre-stent angioplasty was performed using a BALLOON SAPPHIRE 2.5X12. A drug-eluting stent was successfully placed using a SYNERGY XD 3.0X20. Stent strut is well apposed. Stent overlaps previously placed stent. Post-stent angioplasty was performed using a BALLOON SAPPHIRE [place] 3.25X12. Maximum pressure:  18 atm. Post-Intervention Lesion Assessment The intervention was successful. Pre-interventional TIMI flow is 3. Post-intervention TIMI flow is 3. There is a 0% residual stenosis post intervention.  Ramus lesion Stent CATH LAUNCHER 6FR EBU3.5 guide catheter was  inserted. Lesion crossed with guidewire using a WIRE ASAHI PROWATER 180CM. Pre-stent angioplasty was performed using a BALLOON SAPPHIRE 2.5X12. A drug-eluting stent was successfully placed using a STENT ONYX FRONTIER 3.0X15. Stent strut is well apposed. Stent overlaps previously placed stent. Post-stent angioplasty was not performed. Stent A drug-eluting stent was successfully placed using a SYNERGY XD 3.0X16. Post-Intervention Lesion Assessment The intervention was successful. Pre-interventional TIMI flow is 3. Post-intervention TIMI flow is 3. No complications occurred at this lesion. There is a 0% residual stenosis post intervention.   STRESS TESTS  NM MYOCAR MULTI W/SPECT W 02/06/2023  Narrative CLINICAL DATA:  Nonspecific chest pain  EXAM: MYOCARDIAL IMAGING WITH SPECT (REST AND PHARMACOLOGIC-STRESS)  GATED LEFT VENTRICULAR WALL MOTION STUDY  LEFT VENTRICULAR EJECTION FRACTION  TECHNIQUE: Standard myocardial SPECT imaging was performed after resting intravenous injection of 10.7 mCi Tc-50m Myoview. Subsequently, intravenous infusion of Lexiscan was performed under the supervision of the Cardiology staff. At peak effect of the drug, 32.3 mCi Tc-67m Myoview was injected intravenously and standard myocardial SPECT imaging was performed. Quantitative gated imaging was also performed to evaluate left ventricular wall motion, and estimate left ventricular ejection fraction.  COMPARISON:  None Available.  FINDINGS: Perfusion: No decreased activity in the left ventricle on stress imaging to suggest reversible ischemia or infarction.  Wall Motion: Normal left ventricular wall motion. No left ventricular dilation.  Left Ventricular Ejection Fraction: 61%  End diastolic volume 111 ml  End systolic volume 44 ml  IMPRESSION: 1. No reversible ischemia or infarction.  2. Normal left ventricular wall motion.  3. Left ventricular ejection fraction 61%  4. Non invasive risk  stratification*: Low  *2012 Appropriate Use Criteria for Coronary Revascularization Focused Update: J Am Coll Cardiol. 2012;59(9):857-881. http://content.dementiazones.com.aspx?articleid=1201161   Electronically Signed By: Karen Kays M.D. On: 02/06/2023 13:08   ECHOCARDIOGRAM  ECHOCARDIOGRAM COMPLETE 02/04/2023  Narrative ECHOCARDIOGRAM REPORT    Patient Name:   Rachel Clark Date of Exam: 02/04/2023 Medical Rec #:  [id number]         Height:       68.0 in Accession #:    [id number]        Weight:       181.8 lb Date of Birth:  [birth year]        BSA:          1.963 m Patient Age:    71 years          BP:           131/60 mmHg Patient Gender: F                 HR:           85 bpm. Exam Location:  Inpatient  Procedure: 2D Echo, Cardiac Doppler and Color Doppler  Indications:    Chest pain R07.9  History:        Patient has prior history of Echocardiogram examinations, most recent 11/12/2021. CAD and Previous Myocardial Infarction, Prior CABG and Pacemaker; Risk Factors:Hypertension, Dyslipidemia and Former Smoker.  Sonographer:    Dondra Prader RVT RCS Referring Phys: [id number] Elmon Kirschner  IMPRESSIONS   1. Left ventricular ejection fraction, by estimation, is 50 to 55%. The left ventricle has low normal function. The left ventricle demonstrates regional wall motion abnormalities (see scoring diagram/findings for description). Left ventricular diastolic parameters are consistent with Grade I diastolic dysfunction (impaired relaxation). 2. Right ventricular systolic function is normal. The right ventricular size is normal. Tricuspid regurgitation signal is inadequate for assessing PA pressure. 3. The mitral valve is normal in structure. Mild to moderate mitral valve regurgitation. No evidence of mitral stenosis. 4. The aortic valve is tricuspid. Aortic valve regurgitation is not visualized. No aortic stenosis is present. 5. The inferior vena cava is normal in size  with greater than 50% respiratory variability, suggesting right atrial pressure of 3 mmHg.  FINDINGS Left Ventricle: Left ventricular ejection fraction, by estimation, is 50 to 55%. The left ventricle has low normal function. The left ventricle demonstrates regional wall motion abnormalities. The left ventricular internal cavity size was normal in size. There is no left ventricular hypertrophy. Left ventricular diastolic parameters are consistent with Grade I diastolic dysfunction (impaired relaxation).   LV Wall Scoring: The basal inferior segment is  akinetic.  Right Ventricle: The right ventricular size is normal. No increase in right ventricular wall thickness. Right ventricular systolic function is normal. Tricuspid regurgitation signal is inadequate for assessing PA pressure.  Left Atrium: Left atrial size was normal in size.  Right Atrium: Right atrial size was normal in size.  Pericardium: Trivial pericardial effusion is present. The pericardial effusion is circumferential.  Mitral Valve: The mitral valve is normal in structure. Mild to moderate mitral valve regurgitation. No evidence of mitral valve stenosis.  Tricuspid Valve: The tricuspid valve is not well visualized. Tricuspid valve regurgitation is not demonstrated. No evidence of tricuspid stenosis.  Aortic Valve: The aortic valve is tricuspid. Aortic valve regurgitation is not visualized. No aortic stenosis is present. Aortic valve mean gradient measures 3.2 mmHg. Aortic valve peak gradient measures 6.0 mmHg. Aortic valve area, by VTI measures 3.59 cm.  Pulmonic Valve: The pulmonic valve was normal in structure. Pulmonic valve regurgitation is trivial. No evidence of pulmonic stenosis.  Aorta: The aortic root is normal in size and structure.  Venous: The inferior vena cava is normal in size with greater than 50% respiratory variability, suggesting right atrial pressure of 3 mmHg.  IAS/Shunts: No atrial level shunt  detected by color flow Doppler.   LEFT VENTRICLE PLAX 2D LVIDd:         4.90 cm   Diastology LVIDs:         3.90 cm   LV e' medial:    9.57 cm/s LV PW:         1.10 cm   LV E/e' medial:  10.0 LV IVS:        1.10 cm   LV e' lateral:   12.50 cm/s LVOT diam:     2.50 cm   LV E/e' lateral: 7.6 LV SV:         76 LV SV Index:   39 LVOT Area:     4.91 cm   RIGHT VENTRICLE             IVC RV S prime:     10.40 cm/s  IVC diam: 1.50 cm TAPSE (M-mode): 2.5 cm  LEFT ATRIUM             Index        RIGHT ATRIUM           Index LA diam:        4.10 cm 2.09 cm/m   RA Area:     12.10 cm LA Vol (A2C):   58.6 ml 29.83 ml/m  RA Volume:   29.30 ml  14.93 ml/m LA Vol (A4C):   49.9 ml 25.40 ml/m LA Biplane Vol: 57.8 ml 29.45 ml/m AORTIC VALVE                    PULMONIC VALVE AV Area (Vmax):    3.64 cm     PV Vmax:       1.02 m/s AV Area (Vmean):   3.49 cm     PV Peak grad:  4.2 mmHg AV Area (VTI):     3.59 cm AV Vmax:           122.25 cm/s AV Vmean:          81.650 cm/s AV VTI:            0.211 m AV Peak Grad:      6.0 mmHg AV Mean Grad:      3.2 mmHg LVOT Vmax:  90.77 cm/s LVOT Vmean:        58.133 cm/s LVOT VTI:          0.154 m LVOT/AV VTI ratio: 0.73  AORTA Ao Asc diam: 3.40 cm  MITRAL VALVE MV Area (PHT): 4.55 cm    SHUNTS MV Decel Time: 167 msec    Systemic VTI:  0.15 m MV E velocity: 95.33 cm/s  Systemic Diam: 2.50 cm MV A velocity: 82.37 cm/s MV E/A ratio:  1.16  Vishnu Priya Mallipeddi Electronically signed by Winfield Rast Mallipeddi Signature Date/Time: 02/04/2023/3:48:59 PM    Final            Recent Labs: 01/28/2023: ALT 13 02/04/2023: Magnesium 2.0 02/07/2023: BUN 16; Creatinine, Ser 0.74; Hemoglobin 9.2; Platelets 330; Potassium 3.6; Sodium 131  Recent Lipid Panel    Component Value Date/Time   CHOL 165 10/11/2022 1005   TRIG 99 10/11/2022 1005   HDL 87 10/11/2022 1005   CHOLHDL 1.9 10/11/2022 1005   CHOLHDL 2.3 03/09/2021 0218   VLDL 17  03/09/2021 0218   LDLCALC 60 10/11/2022 1005    History of Present Illness   72 year old female with the above past medical history including CAD s/p remote PCI-RCA, CABG in 2007, DES x 3 (full metal jacket)-RCA in 2017, DES x 3-RI in 2022, PCI/DES-LCx in 2023, CHB s/p PPM in 2016, recurrent syncope, orthostatic hypotension, GI bleed, hyperlipidemia with statin intolerance, colon cancer s/p resection and chemotherapy, chronic hyponatremia, seizures, hypothyroidism, fibromyalgia, memory loss, and GERD.  She has a history of extensive CAD with remote NSTEMI and PCI-RCA.  She underwent CABG in 2007 with subsequent failure of SVG-RCA by cath in 2016 s/p DES x 3 to RCA in 11/2015.  She had a non-STEMI in 03/2021 s/p DES x 3 ramus intermedius.  She had a subsequent non-STEMI in 10/2021 s/p complex PCI-LCx.  She has a history of complete heart block/sick sinus syndrome s/p PPM, follows with EP.  She has a history of statin intolerance, on Repatha.  She has been on Effient for antiplatelet therapy due to interaction between Tegretol and Brilinta, history of rash with Plavix.  She was evaluated in the ED on 01/23/2023 in the setting of chest pain.  Troponin was negative.  Outpatient follow-up was advised.  She was last seen in the office on 02/02/2023 and reported recurrent chest pain.  She was scheduled for outpatient cardiac catheterization and was started on Ranexa.  However, prior to her catheterization she presented to the ED on 02/03/2023 with chest pain.  Troponin was negative.  Cardiac catheterization was canceled and instead she underwent Lexiscan Myoview which was low risk.  Ranexa was discontinued due to interaction with Tegretol.  She was started on amlodipine 5 mg daily.  Hemoglobin dropped to as low as 8.4 during admission, she received 1 unit PRBC.  EGD showed LA grade B esophagitis and gastric erosions, no active bleeding.  Follow-up with GI was recommended.  She was discharged home in stable condition on  02/08/2023.  She presents today for follow-up accompanied by her husband.  Since her hospitalization he has been stable from a cardiac standpoint.  She does note ongoing intermittent chest pain, somewhat improved, she did have 1 episode that was significant, relieved with nitroglycerin.  She notes stable intermittent dizziness (she has a history of significant orthostatic hypotension), denies any dyspnea, palpitations, edema, PND, orthopnea, weight gain.  Denies bleeding.  He is concerned that she continues to have intermittent chest discomfort despite normal stress test and  addition of amlodipine.  Home Medications    Current Outpatient Medications  Medication Sig Dispense Refill   acetaminophen (TYLENOL) 500 MG tablet Take 500-1,000 mg by mouth daily as needed for mild pain or headache.     amLODipine (NORVASC) 5 MG tablet Take 1 tablet (5 mg total) by mouth daily. 90 tablet 1   aspirin 81 MG chewable tablet Chew 1 tablet (81 mg total) by mouth daily.     Azelastine HCl 0.15 % SOLN Place 1 spray into both nostrils daily as needed (seasonal allergies).  12   Bempedoic Acid-Ezetimibe (NEXLIZET) 180-10 MG TABS Take 180 mg by mouth daily. 90 tablet 3   calcium carbonate (TUMS - DOSED IN MG ELEMENTAL CALCIUM) 500 MG chewable tablet Chew 500 mg by mouth daily.     carbamazepine (TEGRETOL XR) 200 MG 12 hr tablet Take 3 tablets (600 mg total) by mouth 2 (two) times daily. 540 tablet 4   Cholecalciferol (VITAMIN D) 50 MCG (2000 UT) CAPS Take 4,000 Units by mouth daily.     diphenhydrAMINE (BENADRYL) 25 MG tablet Take 25 mg by mouth daily as needed for allergies.     diphenhydramine-acetaminophen (TYLENOL PM) 25-500 MG TABS tablet Take 1 tablet by mouth at bedtime as needed (Sleep).     DULoxetine (CYMBALTA) 60 MG capsule Take 60 mg by mouth 2 (two) times daily.     ethosuximide (ZARONTIN) 250 MG capsule TAKE 2 CAPSULES IN THE MORNING, TAKE 2 CAPSULES AT NIGHT (Patient taking differently: Take 500 mg by  mouth See admin instructions. Take 2 capsules in the morning and then take 2 capsules by mouth at night) 360 capsule 3   Evolocumab (REPATHA SURECLICK) 140 MG/ML SOAJ Inject 140 mg into the skin every 14 (fourteen) days. 2 mL 11   ferrous sulfate 325 (65 FE) MG EC tablet Take 325 mg by mouth daily with breakfast.     fluticasone (FLONASE) 50 MCG/ACT nasal spray Place 1 spray into both nostrils daily as needed for allergies.     isosorbide mononitrate (IMDUR) 30 MG 24 hr tablet TAKE HALF TABLET (15MG  TOTAL) BY MOUTH EVERY DAY (Patient taking differently: Take 30 mg by mouth daily.) 30 tablet 3   levothyroxine (SYNTHROID, LEVOTHROID) 50 MCG tablet Take 50 mcg by mouth daily before breakfast.     memantine (NAMENDA) 10 MG tablet Take 1 tablet (10 mg total) by mouth 2 (two) times daily. 180 tablet 4   nitroGLYCERIN (NITROSTAT) 0.4 MG SL tablet place 1 tablet under the tongue every 5 minutes as needed for chest pain (Patient taking differently: Place 0.4 mg under the tongue every 5 (five) minutes as needed for chest pain.) 25 tablet 0   nystatin (MYCOSTATIN) 100000 UNIT/ML suspension Take 5 mLs (500,000 Units total) by mouth 4 (four) times daily for 9 days. 180 mL 0   Oral Electrolytes (THERMOTABS PO) Take 452 mg by mouth daily. Thermotabs 452 mg sodium chloride     pantoprazole (PROTONIX) 40 MG tablet Take 1 tablet (40 mg total) by mouth daily. 120 tablet 1   prasugrel (EFFIENT) 10 MG TABS tablet Take 1 tablet (10 mg total) by mouth daily. 90 tablet 3   No current facility-administered medications for this visit.     Review of Systems    She denies  palpitations, dyspnea, pnd, orthopnea, n, v, syncope, edema, weight gain, or early satiety. All other systems reviewed and are otherwise negative except as noted above.   Physical Exam    VS:  BP 130/72   Pulse 83   Ht 5\' 8"  (1.727 m)   Wt 179 lb 3.2 oz (81.3 kg)   SpO2 98%   BMI 27.25 kg/m  GEN: Well nourished, well developed, in no acute  distress. HEENT: normal. Neck: Supple, no JVD, carotid bruits, or masses. Cardiac: RRR, no murmurs, rubs, or gallops. No clubbing, cyanosis, edema.  Radials/DP/PT 2+ and equal bilaterally.  Respiratory:  Respirations regular and unlabored, clear to auscultation bilaterally. GI: Soft, nontender, nondistended, BS + x 4. MS: no deformity or atrophy. Skin: warm and dry, no rash. Neuro:  Strength and sensation are intact. Psych: Normal affect.  Accessory Clinical Findings    ECG personally reviewed by me today - EKG Interpretation Date/Time:  Wednesday February 14 2023 08:25:34 EDT Ventricular Rate:  83 PR Interval:  174 QRS Duration:  84 QT Interval:  370 QTC Calculation: 434 R Axis:   11  Text Interpretation: Sinus rhythm with frequent Premature ventricular complexes in a pattern of bigeminy Nonspecific ST and T wave abnormality Confirmed by Bernadene Person ([id number]) on 02/14/2023 8:27:28 AM  - no acute changes.   Lab Results  Component Value Date   WBC 7.7 02/07/2023   HGB 9.2 (L) 02/07/2023   HCT 29.3 (L) 02/07/2023   MCV 77.3 (L) 02/07/2023   PLT 330 02/07/2023   Lab Results  Component Value Date   CREATININE 0.74 02/07/2023   BUN 16 02/07/2023   NA 131 (L) 02/07/2023   K 3.6 02/07/2023   CL 100 02/07/2023   CO2 20 (L) 02/07/2023   Lab Results  Component Value Date   ALT 13 01/28/2023   AST 20 01/28/2023   ALKPHOS 50 01/28/2023   BILITOT 0.4 01/28/2023   Lab Results  Component Value Date   CHOL 165 10/11/2022   HDL 87 10/11/2022   LDLCALC 60 10/11/2022   TRIG 99 10/11/2022   CHOLHDL 1.9 10/11/2022    Lab Results  Component Value Date   HGBA1C 5.5 02/20/2018    Assessment & Plan    1. CAD:  S/p remote PCI-RCA, CABG in 2007, DES x 3 (full metal jacket)-RCA in 2017, DES x 3-RI in 2022, PCI/DES-LCx in 2023.  Recent hospitalization in the setting of chest pain.  Lexiscan Myoview was low risk.  Ranexa was discontinued due to interaction with Tegretol.  She was started  on amlodipine 5 mg daily.  She continues to note intermittent chest discomfort.  She did have 1 episode that was significant, relieved with nitroglycerin.  Recent stress test reassuring, however, will discuss further recommendations with Dr. Sw[place]iland given extensive cardiac history.  For now, will increase Imdur to 30 mg daily.  Continue aspirin, Effient, amlodipine, Imdur as above, Repatha, and Nexlizet.   ADDENDUM 02/20/2023: Recent OV/symptoms/cardiac workup reviewed with Dr. Sw[place]iland. Per Dr. Sw[place]and, esophagitis on her EGD "is the likely cause of her chest pain. I would be OK with proceeding with colonoscopy. Despite bad chest pain all her cardiac work up was negative." For now, will continue medical therapy.    2. Complete heart block/bigeminy: S/p PPM.  Most recent device check was normal.  EKG today shows bigeminy pattern, this has been noted on prior EKGs.  Asymptomatic.  Follows with EP.   3. History of syncope/orthostatic hypotension: She notes stable intermittent dizziness, denies any recent syncope.   4. History of GI bleed/anemia: EGD during recent hospitalization showed esophagitis, gastric erosions, no active bleeding.  Hemoglobin was as low as 8.4, s/p 1 unit PRBC.  Following with GI.  She notes that she will likely require colonoscopy.  Will check CBC today.  I will reach out to Dr. [place] to see if he is agreeable for her to proceed with colonoscopy and if she may hold her antiplatelet therapy prior to procedure.  ADDENDUM 02/20/2023: Per Dr. Swaziland, patient is at acceptable risk to proceed with colonoscopy.  She may hold her antiplatelet therapy, if requested, prior to procedure.  5. Hyperlipidemia: LDL was 60 in 10/2022. Continue Repatha, Nexlizet.   6. Hypothyroidism: TSH was 1.09 in 12/2022.  Managed per PCP.  7.  Chronic hyponatremia: Sodium was 131 during recent hospitalization.  Will repeat BMET.  8. Disposition: Follow-up in 2-3 months with APP (sooner if needed), next available  with Dr. Swaziland.       Joylene Grapes, NP 02/14/2023, 9:03 AM

## 2023-02-14 NOTE — Patient Instructions (Signed)
Medication Instructions:  Increase Imdur 30 mg daily  *If you need a refill on your cardiac medications before your next appointment, please call your pharmacy*   Lab Work: CBC, BMET today  Testing/Procedures: NONE ordered at this time of appointment      Follow-Up: At [facility], you and your health needs are our priority.  As part of our continuing mission to provide you with exceptional heart care, we have created designated Provider Care Teams.  These Care Teams include your primary Cardiologist (physician) and Advanced Practice Providers (APPs -  Physician Assistants and Nurse Practitioners) who all work together to provide you with the care you need, when you need it.  We recommend signing up for the patient portal called "MyChart".  Sign up information is provided on this After Visit Summary.  MyChart is used to connect with patients for Virtual Visits (Telemedicine).  Patients are able to view lab/test results, encounter notes, upcoming appointments, etc.  Non-urgent messages can be sent to your provider as well.   To learn more about what you can do with MyChart, go to [email or web address].    Your next appointment:   2-3 month(s) Bernadene Person NP) Next Available (Dr. [place])  Provider:   Bernadene Person, NP

## 2023-02-15 ENCOUNTER — Other Ambulatory Visit: Payer: Self-pay | Admitting: Cardiology

## 2023-02-15 LAB — CBC
Hematocrit: 34.4 % (ref 34.0–46.6)
Hemoglobin: 11.2 g/dL (ref 11.1–15.9)
MCH: 25.6 pg — ABNORMAL LOW (ref 26.6–33.0)
MCHC: 32.6 g/dL (ref 31.5–35.7)
MCV: 79 fL (ref 79–97)
Platelets: 433 x10E3/uL (ref 150–450)
RBC: 4.38 x10E6/uL (ref 3.77–5.28)
RDW: 20.6 % — ABNORMAL HIGH (ref 11.7–15.4)
WBC: 9.1 x10E3/uL (ref 3.4–10.8)

## 2023-02-15 LAB — BASIC METABOLIC PANEL WITH GFR
BUN/Creatinine Ratio: 14 (ref 12–28)
BUN: 12 mg/dL (ref 8–27)
CO2: 24 mmol/L (ref 20–29)
Calcium: 9.1 mg/dL (ref 8.7–10.3)
Chloride: 101 mmol/L (ref 96–106)
Creatinine, Ser: 0.85 mg/dL (ref 0.57–1.00)
Glucose: 119 mg/dL — ABNORMAL HIGH (ref 70–99)
Potassium: 4.3 mmol/L (ref 3.5–5.2)
Sodium: 137 mmol/L (ref 134–144)
eGFR: 72 mL/min/1.73

## 2023-02-16 ENCOUNTER — Telehealth: Payer: Self-pay | Admitting: Cardiology

## 2023-02-16 NOTE — Telephone Encounter (Signed)
Husband called to follow-up on patient's lab results.

## 2023-02-19 NOTE — Telephone Encounter (Signed)
Left voicemail to return call to office.

## 2023-02-19 NOTE — Telephone Encounter (Signed)
Husband called to follow-up on patient's lab results.

## 2023-02-20 ENCOUNTER — Encounter (INDEPENDENT_AMBULATORY_CARE_PROVIDER_SITE_OTHER): Payer: Medicare Other

## 2023-02-20 ENCOUNTER — Telehealth (HOSPITAL_BASED_OUTPATIENT_CLINIC_OR_DEPARTMENT_OTHER): Payer: Self-pay

## 2023-02-20 DIAGNOSIS — I495 Sick sinus syndrome: Secondary | ICD-10-CM

## 2023-02-20 LAB — CUP PACEART REMOTE DEVICE CHECK
Battery Remaining Longevity: 45 mo
Battery Voltage: 2.97 V
Brady Statistic AP VP Percent: 0.02 %
Brady Statistic AP VS Percent: 5.36 %
Brady Statistic AS VP Percent: 0.11 %
Brady Statistic AS VS Percent: 94.51 %
Brady Statistic RA Percent Paced: 5.27 %
Brady Statistic RV Percent Paced: 0.13 %
Date Time Interrogation Session: 20240820103925
Implantable Lead Connection Status: 753985
Implantable Lead Connection Status: 753985
Implantable Lead Implant Date: 20160922
Implantable Lead Implant Date: 20160922
Implantable Lead Location: 753859
Implantable Lead Location: 753860
Implantable Lead Model: 5076
Implantable Lead Model: 5076
Implantable Pulse Generator Implant Date: 20160922
Lead Channel Impedance Value: 1330 Ohm
Lead Channel Impedance Value: 1330 Ohm
Lead Channel Impedance Value: 399 Ohm
Lead Channel Impedance Value: 437 Ohm
Lead Channel Pacing Threshold Amplitude: 0.875 V
Lead Channel Pacing Threshold Amplitude: 1.375 V
Lead Channel Pacing Threshold Pulse Width: 0.4 ms
Lead Channel Pacing Threshold Pulse Width: 0.4 ms
Lead Channel Sensing Intrinsic Amplitude: 2.375 mV
Lead Channel Sensing Intrinsic Amplitude: 2.375 mV
Lead Channel Sensing Intrinsic Amplitude: 7 mV
Lead Channel Sensing Intrinsic Amplitude: 7 mV
Lead Channel Setting Pacing Amplitude: 2.5 V
Lead Channel Setting Pacing Amplitude: 2.5 V
Lead Channel Setting Pacing Pulse Width: 0.4 ms
Lead Channel Setting Sensing Sensitivity: 2.8 mV
Zone Setting Status: 755011
Zone Setting Status: 755011

## 2023-02-20 NOTE — Telephone Encounter (Signed)
Spoke with patient husband per DPR. He is aware of lab results and verbalized understanding

## 2023-02-20 NOTE — Telephone Encounter (Signed)
Spoke with pt and spouse. They were notified of lab results. Pt will continue current medication and f/u as planned.

## 2023-02-20 NOTE — Telephone Encounter (Signed)
Husband returned RN's call stating he had inadvertently blocked the number and has now released the block.

## 2023-02-23 DIAGNOSIS — G40409 Other generalized epilepsy and epileptic syndromes, not intractable, without status epilepticus: Secondary | ICD-10-CM | POA: Diagnosis not present

## 2023-02-23 DIAGNOSIS — I2510 Atherosclerotic heart disease of native coronary artery without angina pectoris: Secondary | ICD-10-CM | POA: Diagnosis not present

## 2023-02-23 DIAGNOSIS — I10 Essential (primary) hypertension: Secondary | ICD-10-CM | POA: Diagnosis not present

## 2023-02-23 DIAGNOSIS — D500 Iron deficiency anemia secondary to blood loss (chronic): Secondary | ICD-10-CM | POA: Diagnosis not present

## 2023-02-23 DIAGNOSIS — E785 Hyperlipidemia, unspecified: Secondary | ICD-10-CM | POA: Diagnosis not present

## 2023-02-23 DIAGNOSIS — I495 Sick sinus syndrome: Secondary | ICD-10-CM | POA: Diagnosis not present

## 2023-02-28 DIAGNOSIS — E785 Hyperlipidemia, unspecified: Secondary | ICD-10-CM | POA: Diagnosis not present

## 2023-02-28 DIAGNOSIS — I10 Essential (primary) hypertension: Secondary | ICD-10-CM | POA: Diagnosis not present

## 2023-02-28 DIAGNOSIS — G40409 Other generalized epilepsy and epileptic syndromes, not intractable, without status epilepticus: Secondary | ICD-10-CM | POA: Diagnosis not present

## 2023-02-28 DIAGNOSIS — I495 Sick sinus syndrome: Secondary | ICD-10-CM | POA: Diagnosis not present

## 2023-02-28 DIAGNOSIS — D500 Iron deficiency anemia secondary to blood loss (chronic): Secondary | ICD-10-CM | POA: Diagnosis not present

## 2023-02-28 DIAGNOSIS — I2510 Atherosclerotic heart disease of native coronary artery without angina pectoris: Secondary | ICD-10-CM | POA: Diagnosis not present

## 2023-03-02 NOTE — Progress Notes (Signed)
Remote pacemaker transmission.   

## 2023-03-06 DIAGNOSIS — K209 Esophagitis, unspecified without bleeding: Secondary | ICD-10-CM | POA: Diagnosis not present

## 2023-03-06 DIAGNOSIS — Z8719 Personal history of other diseases of the digestive system: Secondary | ICD-10-CM | POA: Diagnosis not present

## 2023-03-06 DIAGNOSIS — D500 Iron deficiency anemia secondary to blood loss (chronic): Secondary | ICD-10-CM | POA: Diagnosis not present

## 2023-03-06 DIAGNOSIS — E871 Hypo-osmolality and hyponatremia: Secondary | ICD-10-CM | POA: Diagnosis not present

## 2023-03-06 DIAGNOSIS — I10 Essential (primary) hypertension: Secondary | ICD-10-CM | POA: Diagnosis not present

## 2023-03-06 DIAGNOSIS — I25119 Atherosclerotic heart disease of native coronary artery with unspecified angina pectoris: Secondary | ICD-10-CM | POA: Diagnosis not present

## 2023-03-06 DIAGNOSIS — Z86010 Personal history of colonic polyps: Secondary | ICD-10-CM | POA: Diagnosis not present

## 2023-03-07 DIAGNOSIS — I10 Essential (primary) hypertension: Secondary | ICD-10-CM | POA: Diagnosis not present

## 2023-03-07 DIAGNOSIS — D500 Iron deficiency anemia secondary to blood loss (chronic): Secondary | ICD-10-CM | POA: Diagnosis not present

## 2023-03-07 DIAGNOSIS — I2510 Atherosclerotic heart disease of native coronary artery without angina pectoris: Secondary | ICD-10-CM | POA: Diagnosis not present

## 2023-03-07 DIAGNOSIS — I495 Sick sinus syndrome: Secondary | ICD-10-CM | POA: Diagnosis not present

## 2023-03-07 DIAGNOSIS — E785 Hyperlipidemia, unspecified: Secondary | ICD-10-CM | POA: Diagnosis not present

## 2023-03-07 DIAGNOSIS — G40409 Other generalized epilepsy and epileptic syndromes, not intractable, without status epilepticus: Secondary | ICD-10-CM | POA: Diagnosis not present

## 2023-03-11 DIAGNOSIS — I2510 Atherosclerotic heart disease of native coronary artery without angina pectoris: Secondary | ICD-10-CM | POA: Diagnosis not present

## 2023-03-11 DIAGNOSIS — I10 Essential (primary) hypertension: Secondary | ICD-10-CM | POA: Diagnosis not present

## 2023-03-11 DIAGNOSIS — G40409 Other generalized epilepsy and epileptic syndromes, not intractable, without status epilepticus: Secondary | ICD-10-CM | POA: Diagnosis not present

## 2023-03-11 DIAGNOSIS — I495 Sick sinus syndrome: Secondary | ICD-10-CM | POA: Diagnosis not present

## 2023-03-11 DIAGNOSIS — E785 Hyperlipidemia, unspecified: Secondary | ICD-10-CM | POA: Diagnosis not present

## 2023-03-11 DIAGNOSIS — D500 Iron deficiency anemia secondary to blood loss (chronic): Secondary | ICD-10-CM | POA: Diagnosis not present

## 2023-03-14 DIAGNOSIS — M549 Dorsalgia, unspecified: Secondary | ICD-10-CM | POA: Diagnosis not present

## 2023-03-14 DIAGNOSIS — I10 Essential (primary) hypertension: Secondary | ICD-10-CM | POA: Diagnosis not present

## 2023-03-14 DIAGNOSIS — Z85038 Personal history of other malignant neoplasm of large intestine: Secondary | ICD-10-CM | POA: Diagnosis not present

## 2023-03-14 DIAGNOSIS — K219 Gastro-esophageal reflux disease without esophagitis: Secondary | ICD-10-CM | POA: Diagnosis not present

## 2023-03-14 DIAGNOSIS — D500 Iron deficiency anemia secondary to blood loss (chronic): Secondary | ICD-10-CM | POA: Diagnosis not present

## 2023-03-14 DIAGNOSIS — I252 Old myocardial infarction: Secondary | ICD-10-CM | POA: Diagnosis not present

## 2023-03-14 DIAGNOSIS — F32A Depression, unspecified: Secondary | ICD-10-CM | POA: Diagnosis not present

## 2023-03-14 DIAGNOSIS — I951 Orthostatic hypotension: Secondary | ICD-10-CM | POA: Diagnosis not present

## 2023-03-14 DIAGNOSIS — Z951 Presence of aortocoronary bypass graft: Secondary | ICD-10-CM | POA: Diagnosis not present

## 2023-03-14 DIAGNOSIS — Z7982 Long term (current) use of aspirin: Secondary | ICD-10-CM | POA: Diagnosis not present

## 2023-03-14 DIAGNOSIS — I2510 Atherosclerotic heart disease of native coronary artery without angina pectoris: Secondary | ICD-10-CM | POA: Diagnosis not present

## 2023-03-14 DIAGNOSIS — E785 Hyperlipidemia, unspecified: Secondary | ICD-10-CM | POA: Diagnosis not present

## 2023-03-14 DIAGNOSIS — D472 Monoclonal gammopathy: Secondary | ICD-10-CM | POA: Diagnosis not present

## 2023-03-14 DIAGNOSIS — Z7951 Long term (current) use of inhaled steroids: Secondary | ICD-10-CM | POA: Diagnosis not present

## 2023-03-14 DIAGNOSIS — M797 Fibromyalgia: Secondary | ICD-10-CM | POA: Diagnosis not present

## 2023-03-14 DIAGNOSIS — Z604 Social exclusion and rejection: Secondary | ICD-10-CM | POA: Diagnosis not present

## 2023-03-14 DIAGNOSIS — G40409 Other generalized epilepsy and epileptic syndromes, not intractable, without status epilepticus: Secondary | ICD-10-CM | POA: Diagnosis not present

## 2023-03-14 DIAGNOSIS — G8929 Other chronic pain: Secondary | ICD-10-CM | POA: Diagnosis not present

## 2023-03-14 DIAGNOSIS — E871 Hypo-osmolality and hyponatremia: Secondary | ICD-10-CM | POA: Diagnosis not present

## 2023-03-14 DIAGNOSIS — I495 Sick sinus syndrome: Secondary | ICD-10-CM | POA: Diagnosis not present

## 2023-03-14 DIAGNOSIS — E039 Hypothyroidism, unspecified: Secondary | ICD-10-CM | POA: Diagnosis not present

## 2023-03-14 DIAGNOSIS — Z87891 Personal history of nicotine dependence: Secondary | ICD-10-CM | POA: Diagnosis not present

## 2023-03-14 DIAGNOSIS — Z95810 Presence of automatic (implantable) cardiac defibrillator: Secondary | ICD-10-CM | POA: Diagnosis not present

## 2023-03-20 DIAGNOSIS — E785 Hyperlipidemia, unspecified: Secondary | ICD-10-CM | POA: Diagnosis not present

## 2023-03-20 DIAGNOSIS — I2510 Atherosclerotic heart disease of native coronary artery without angina pectoris: Secondary | ICD-10-CM | POA: Diagnosis not present

## 2023-03-20 DIAGNOSIS — G40409 Other generalized epilepsy and epileptic syndromes, not intractable, without status epilepticus: Secondary | ICD-10-CM | POA: Diagnosis not present

## 2023-03-20 DIAGNOSIS — I495 Sick sinus syndrome: Secondary | ICD-10-CM | POA: Diagnosis not present

## 2023-03-20 DIAGNOSIS — D500 Iron deficiency anemia secondary to blood loss (chronic): Secondary | ICD-10-CM | POA: Diagnosis not present

## 2023-03-20 DIAGNOSIS — I10 Essential (primary) hypertension: Secondary | ICD-10-CM | POA: Diagnosis not present

## 2023-04-05 ENCOUNTER — Ambulatory Visit: Payer: Medicare Other | Admitting: Neurology

## 2023-04-06 NOTE — Progress Notes (Signed)
Cardiology Office Note   Date:  04/13/2023   ID:  BENNETT VANSCYOC, DOB 22-Jun-1951, MRN [id number]  PCP:  Laurann Montana, MD  Cardiologist:   Curties Conigliaro [place], MD   Chief Complaint  Patient presents with   Follow-up   Coronary Artery Disease   History of Present Illness: Rachel Clark is a 72 y.o. female is seen for follow up CAD. 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 Subsequent failure of SVG to RCA. S/p extensive stenting of RCA in the past- last intervention in 2017 with PTCA of ostial RCA for restenosis. In August 2019 stented RCA was widely patent.    Since November 2021 she has had multiple episodes of dizziness and syncope. She was found to be orthostatic. Seen in our clinic and later by Dr Johney Frame. Pacemaker function normal. Imdur was discontinued. Hydration seemed to improve symptoms. Echo was unremarkable.   Was seen in the ED on 09/04/20 with recurrent syncope after standing. She was orthostatic. Labs were unremarkable except mildly decreased sodium. No changes made at that time. Seen again in ED on May 11 with fall. Labs ok except low sodium level, pacer check and CXR Ok. Again hydrated. Her husband reports she passed out again on Father's day. Really doesn't have any warning. Is out only a few seconds. She has liberalized sodium intake. Wearing compression hose. Husband reports she naps 60% of the day. Seen recently by Dr Anne Hahn. No seizures noted. Husband has kept a BP record. Sometimes has a 20 point drop in BP with standing but at other times no change. We stopped her amlodipine.   She was admitted 03/08/21 with a NSTEMI. Ecg showed diffuse ST depression. Troponin peaked at 39. She underwent repeat cardiac cath showing patent LIMA to the LAD and patent stents in the RCA. There was new disease in a large ramus  branch with sequential high grade stenoses. She had PCI which was difficult due to tortuosity and calcification of the vessel making deliverablility of stents difficult. The disease was successfully covered with 3 DES. She was placed on Effient due to interaction of Brilinta with Tegretol. DC the following day. She has a history of rash on Plavix. Indefinite DAPT recommended due to extensive stents. Hgb at time of DC was 12.1.   She was readmitted on 03/13/21 with acute upper GI bleed with coffee ground emesis. Hgb dropped to 7.7 then to 6.5. she was transfused multiple units of PRBCs. DAPT was continued given recent stents. EGD showed a visible vessel in the gastric antrum treated with epinephrine injection and clipping. Treated with PPI. Bleeding scan was subsequently negative. Hgb improved to 9.6 at discharge.   She was seen in EP clinic in January. No significant arrhythmia noted and pacer OK.  She was admitted to [facility] in May 2023 with chest pain.  With low-level troponins she was initially treated medically and started on Imdur.  She returned to the ED later that evening with troponin rise in the 200s.  She underwent LHC with Difficult/complex but successful scoring balloon angioplasty with shockwave lithotripsy followed by distal overlapping DES stent placement to LCX.  With hemoglobin drop to 7.3, she received 2 units of pRBCs.   She underwent EGD showing small prepyloric gastric ulcer with no evidence of  active bleeding, some gastritis. Recommendations for IV protonix BID while inpatient, then switch  to protonix 40mg  BID for 4 weeks at discharge, then protonix 40mg  daily indefinitely. Was seen in June and doing OK. No bleeding. Repeat CBC in July showed stable Hgb 10.2.   She was evaluated in the ED on 01/23/2023 in the setting of chest pain.  Troponin was negative.  Outpatient follow-up was advised.  She was seen in the office on 02/02/2023 and reported recurrent chest pain.  She was scheduled for  outpatient cardiac catheterization and was started on Ranexa.  However, prior to her catheterization she presented to the ED on 02/03/2023 with chest pain.  Troponin was negative.  Cardiac catheterization was canceled and instead she underwent Lexiscan Myoview which was low risk.  Ranexa was discontinued due to interaction with Tegretol.  She was started on amlodipine 5 mg daily.  Hemoglobin dropped to as low as 8.4 during admission, she received 1 unit PRBC.  EGD showed LA grade B esophagitis and gastric erosions, no active bleeding.  Follow-up with GI was recommended.  She was discharged home in stable condition on 02/08/2023. She was seen in follow up with improvement in chest pain but still present. She was given clearance to have colonoscopy.     On follow up today with her husband she notes she is doing very well. No chest pain. No bleeding. No Orthostatic symptoms. She is not very active. She has lost 9 lbs since August.    Past Medical History:  Diagnosis Date   Arthritis    "fingers" (06/29/2016)   Chronic lower back pain    Colon cancer (HCC) 12/04/2011   s/p Laparoscopic-assisted transverse colectomy on 12/19/2011 by Dr. Dwain Sarna.  pT3 N0 M0.    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   last seizure was in 1972 ;takes Tegretol (06/29/2016)    Headache    "weekly" (06/29/2016)   Hyperlipidemia    Hyponatremia    Hypothyroid    Iron deficiency anemia 11/17/2011   Memory change 11/13/2016   Monoclonal gammopathy of unknown significance    Orthostatic hypotension    Presence of permanent cardiac pacemaker    Syncope and collapse    pacemaker implanted    Past Surgical History:  Procedure Laterality Date   ANTERIOR CERVICAL DECOMP/DISCECTOMY FUSION  1980's   BACK SURGERY     CARDIAC  CATHETERIZATION  05/20/2009   obstructive native vessel disease in LAD, RCA, and first diagonal, patent vein graft to distal RCA and LIMA to LAD,normal. ef 60%   CARDIAC CATHETERIZATION N/A 03/24/2015   Procedure: Left Heart Cath and Cors/Grafts Angiography;  Surgeon: Iran Ouch, MD;  Location: [facility] INVASIVE CV LAB;  Service: Cardiovascular;  Laterality: N/A;   CARDIAC CATHETERIZATION N/A 07/28/2015   Procedure: Left Heart Cath and Coronary Angiography;  Surgeon: Lennette Bihari, MD;  Location: [facility] INVASIVE CV LAB;  Service: Cardiovascular;  Laterality: N/A;   CARDIAC CATHETERIZATION N/A 11/09/2015   Procedure: Coronary Stent Intervention;  Surgeon: Tonny Bollman, MD;  Location: [facility] INVASIVE CV LAB;  Service: Cardiovascular;  Laterality: N/A;   CARDIAC CATHETERIZATION N/A 11/09/2015   Procedure: Left Heart Cath and Coronary Angiography;  Surgeon: Tonny Bollman, MD;  Location: [facility] INVASIVE CV LAB;  Service: Cardiovascular;  Laterality: N/A;   CARDIAC CATHETERIZATION N/A 06/29/2016   Procedure:  Left Heart Cath and Coronary Angiography;  Surgeon: Yvonne Kendall, MD;  Location: [facility] INVASIVE CV LAB;  Service: Cardiovascular;  Laterality: N/A;   CARDIAC CATHETERIZATION N/A 06/29/2016   Procedure: Intravascular Pressure Wire/FFR Study;  Surgeon: Yvonne Kendall, MD;  Location: [facility] INVASIVE CV LAB;  Service: Cardiovascular;  Laterality: N/A;   CARDIAC CATHETERIZATION N/A 06/29/2016   Procedure: Coronary Balloon Angioplasty;  Surgeon: Yvonne Kendall, MD;  Location: [facility] INVASIVE CV LAB;  Service: Cardiovascular;  Laterality: N/A;   COLON RESECTION  12/19/2011   Procedure: COLON RESECTION LAPAROSCOPIC;  Surgeon: Emelia Loron, MD;  Location: [facility] OR;  Service: General;  Laterality: N/A;  laparoscopic hand assisted partial colon resection   COLON SURGERY     COLONOSCOPY     CORONARY ANGIOPLASTY WITH STENT PLACEMENT  ~ 2007   1 stent   CORONARY ARTERY BYPASS GRAFT  2007   "CABG X2"   DILATION AND CURETTAGE OF  UTERUS  1973   EP IMPLANTABLE DEVICE N/A 03/25/2015   MDT Advisa DR pacemaker implanted by Dr Johney Frame for transient complete heart block and syncope   ESOPHAGOGASTRODUODENOSCOPY (EGD) WITH PROPOFOL N/A 03/14/2021   Procedure: ESOPHAGOGASTRODUODENOSCOPY (EGD) WITH PROPOFOL;  Surgeon: Kerin Salen, MD;  Location: [facility] ENDOSCOPY;  Service: Gastroenterology;  Laterality: N/A;   ESOPHAGOGASTRODUODENOSCOPY (EGD) WITH PROPOFOL N/A 11/13/2021   Procedure: ESOPHAGOGASTRODUODENOSCOPY (EGD) WITH PROPOFOL;  Surgeon: Kathi Der, MD;  Location: [facility] ENDOSCOPY;  Service: Gastroenterology;  Laterality: N/A;   ESOPHAGOGASTRODUODENOSCOPY (EGD) WITH PROPOFOL N/A 02/07/2023   Procedure: ESOPHAGOGASTRODUODENOSCOPY (EGD) WITH PROPOFOL;  Surgeon: Kathi Der, MD;  Location: [facility] ENDOSCOPY;  Service: Gastroenterology;  Laterality: N/A;   HEMOSTASIS CLIP PLACEMENT  03/14/2021   Procedure: HEMOSTASIS CLIP PLACEMENT;  Surgeon: Kerin Salen, MD;  Location: [facility] ENDOSCOPY;  Service: Gastroenterology;;   INSERT / REPLACE / REMOVE PACEMAKER     LEFT HEART CATH AND CORONARY ANGIOGRAPHY N/A 11/11/2021   Procedure: LEFT HEART CATH AND CORONARY ANGIOGRAPHY;  Surgeon: Marykay Lex, MD;  Location: [facility] INVASIVE CV LAB;  Service: Cardiovascular;  Laterality: N/A;   LEFT HEART CATH AND CORS/GRAFTS ANGIOGRAPHY N/A 02/21/2018   Procedure: LEFT HEART CATH AND CORS/GRAFTS ANGIOGRAPHY;  Surgeon: Runell Gess, MD;  Location: [facility] INVASIVE CV LAB;  Service: Cardiovascular;  Laterality: N/A;   LEFT HEART CATH AND CORS/GRAFTS ANGIOGRAPHY N/A 03/08/2021   Procedure: LEFT HEART CATH AND CORS/GRAFTS ANGIOGRAPHY;  Surgeon: [place], Sherriann Szuch M, MD;  Location: [facility] INVASIVE CV LAB;  Service: Cardiovascular;  Laterality: N/A;   PORT-A-CATH REMOVAL N/A 06/12/2013   Procedure: REMOVAL PORT-A-CATH;  Surgeon: Emelia Loron, MD;  Location: [facility];  Service: General;  Laterality: N/A;   PORTACATH PLACEMENT  06/04/2012   Procedure: INSERTION  PORT-A-CATH;  Surgeon: Emelia Loron, MD;  Location: [facility] OR;  Service: General;  Laterality: N/A;  Insertion of port-a-cath    POSTERIOR LAMINECTOMY / DECOMPRESSION CERVICAL SPINE  1990s   SCLEROTHERAPY  03/14/2021   Procedure: SCLEROTHERAPY;  Surgeon: Kerin Salen, MD;  Location: [facility] ENDOSCOPY;  Service: Gastroenterology;;   VAGINAL HYSTERECTOMY       Current Outpatient Medications  Medication Sig Dispense Refill   acetaminophen (TYLENOL) 500 MG tablet Take 500-1,000 mg by mouth daily as needed for mild pain or headache.     amLODipine (NORVASC) 5 MG tablet Take 1 tablet (5 mg total) by mouth daily. 90 tablet 1   aspirin 81 MG chewable tablet Chew 1 tablet (81 mg total) by mouth daily.     Azelastine HCl 0.15 % SOLN Place 1  spray into both nostrils daily as needed (seasonal allergies).  12   Bempedoic Acid-Ezetimibe (NEXLIZET) 180-10 MG TABS Take 180 mg by mouth daily. 90 tablet 3   calcium carbonate (TUMS - DOSED IN MG ELEMENTAL CALCIUM) 500 MG chewable tablet Chew 500 mg by mouth daily.     carbamazepine (TEGRETOL XR) 200 MG 12 hr tablet Take 3 tablets (600 mg total) by mouth 2 (two) times daily. 540 tablet 4   Cholecalciferol (VITAMIN D) 50 MCG (2000 UT) CAPS Take 4,000 Units by mouth daily.     diphenhydrAMINE (BENADRYL) 25 MG tablet Take 25 mg by mouth daily as needed for allergies.     diphenhydramine-acetaminophen (TYLENOL PM) 25-500 MG TABS tablet Take 1 tablet by mouth at bedtime as needed (Sleep).     DULoxetine (CYMBALTA) 60 MG capsule Take 60 mg by mouth 2 (two) times daily.     ethosuximide (ZARONTIN) 250 MG capsule TAKE 2 CAPSULES IN THE MORNING, TAKE 2 CAPSULES AT NIGHT (Patient taking differently: Take 500 mg by mouth See admin instructions. Take 2 capsules in the morning and then take 2 capsules by mouth at night) 360 capsule 3   Evolocumab (REPATHA SURECLICK) 140 MG/ML SOAJ Inject 140 mg into the skin every 14 (fourteen) days. 2 mL 11   ferrous sulfate 325 (65 FE) MG EC  tablet Take 325 mg by mouth daily with breakfast.     fluticasone (FLONASE) 50 MCG/ACT nasal spray Place 1 spray into both nostrils daily as needed for allergies.     isosorbide mononitrate (IMDUR) 30 MG 24 hr tablet TAKE HALF TABLET (15MG  TOTAL) BY MOUTH EVERY DAY (Patient taking differently: Take 30 mg by mouth daily.) 30 tablet 3   levothyroxine (SYNTHROID, LEVOTHROID) 50 MCG tablet Take 50 mcg by mouth daily before breakfast.     memantine (NAMENDA) 10 MG tablet Take 1 tablet (10 mg total) by mouth 2 (two) times daily. 180 tablet 4   nitroGLYCERIN (NITROSTAT) 0.4 MG SL tablet place 1 tablet under the tongue every 5 minutes as needed for chest pain 25 tablet 3   Oral Electrolytes (THERMOTABS PO) Take 452 mg by mouth daily. Thermotabs 452 mg sodium chloride     pantoprazole (PROTONIX) 40 MG tablet Take 1 tablet (40 mg total) by mouth daily. 120 tablet 1   prasugrel (EFFIENT) 10 MG TABS tablet Take 1 tablet (10 mg total) by mouth daily. 90 tablet 3   No current facility-administered medications for this visit.    Allergies:   Sulfamethoxazole, Aspirin, Crestor [rosuvastatin], Erythromycin, Lisinopril, Niacin and related, Penicillin g benzathine, Statins, Sulfa drugs cross reactors, Tetracyclines & related, Xeloda [capecitabine], Penicillins, and Plavix [clopidogrel bisulfate]    Social History:  The patient  reports that she quit smoking about 41 years ago. Her smoking use included cigarettes. She started smoking about 61 years ago. She has a 6 pack-year smoking history. She has never used smokeless tobacco. She reports current alcohol use of about 1.0 standard drink of alcohol per week. She reports that she does not use drugs.   Family History:  The patient's family history includes Cancer in her mother; Heart attack in her father; Heart attack (age of onset: 31) in her brother; Heart disease in her father.    ROS:  Please see the history of present illness.   Otherwise, review of systems are  positive for none.   All other systems are reviewed and negative.    PHYSICAL EXAM: VS:  BP 116/64 (BP Location: Left  Arm, Patient Position: Sitting, Cuff Size: Normal)   Pulse 90   Ht 5\' 8"  (1.727 m)   Wt 173 lb (78.5 kg)   BMI 26.30 kg/m  , BMI Body mass index is 26.3 kg/m.   GEN: Well nourished, well developed, in no acute distress  HEENT: normal  Neck: no JVD, carotid bruits, or masses Cardiac: RRR; no murmurs, rubs, or gallops,no edema  Respiratory:  clear to auscultation bilaterally, normal work of breathing GI: soft, nontender, nondistended, + BS MS: no deformity or atrophy. No groin hematoma. Skin: warm and dry, no rash Neuro:  Strength and sensation are intact Psych: euthymic mood, full affect   EKG:  EKG is not ordered today.     Recent Labs: 01/28/2023: ALT 13 02/04/2023: Magnesium 2.0 02/14/2023: BUN 12; Creatinine, Ser 0.85; Hemoglobin 11.2; Platelets 433; Potassium 4.3; Sodium 137   Dated 03/24/21: Hgb 12.2. normal BMET.  Dated 05/30/21: cholesterol 241, triglycerides 116, HDL 81, LDL 140. CMET and TSH normal.   Lipid Panel    Component Value Date/Time   CHOL 165 10/11/2022 1005   TRIG 99 10/11/2022 1005   HDL 87 10/11/2022 1005   CHOLHDL 1.9 10/11/2022 1005   CHOLHDL 2.3 03/09/2021 0218   VLDL 17 03/09/2021 0218   LDLCALC 60 10/11/2022 1005       Wt Readings from Last 3 Encounters:  04/13/23 173 lb (78.5 kg)  02/14/23 179 lb 3.2 oz (81.3 kg)  02/03/23 181 lb 12.8 oz (82.5 kg)      Other studies Reviewed: Additional studies/ records that were reviewed today include:   Cardiac cath 02/21/18:  LEFT HEART CATH AND CORS/GRAFTS ANGIOGRAPHY    Conclusion    Previously placed Ost RCA to Mid RCA stent (unknown type) is widely patent. Prox LAD lesion is 100% stenosed. Origin lesion is 100% stenosed. The left ventricular systolic function is normal. LV end diastolic pressure is normal. The left ventricular ejection fraction is 55-65% by visual  estimate.     Echo 07/30/20: IMPRESSIONS     1. Left ventricular ejection fraction, by estimation, is 60 to 65%. The  left ventricle has normal function. The left ventricle has no regional  wall motion abnormalities. Left ventricular diastolic parameters are  consistent with Grade I diastolic  dysfunction (impaired relaxation).   2. Right ventricular systolic function is normal. The right ventricular  size is normal.   3. The mitral valve is normal in structure. Trivial mitral valve  regurgitation. No evidence of mitral stenosis.   4. The aortic valve is tricuspid. Aortic valve regurgitation is not  visualized. Mild aortic valve sclerosis is present, with no evidence of  aortic valve stenosis.   5. The inferior vena cava is normal in size with greater than 50%  respiratory variability, suggesting right atrial pressure of 3 mmHg.   Comparison(s): Prior images unable to be directly viewed, comparison made  by report only. No significant change from prior study. 07/26/15 EF 60-65%.  GLS -18.6%.   Cardiac cath 03/08/21:  LEFT HEART CATH AND CORS/GRAFTS ANGIOGRAPHY   Conclusion      Prox LAD lesion is 100% stenosed.   Origin lesion is 100% stenosed.   Ost LAD to Prox LAD lesion is 60% stenosed.   Ost 1st Diag to 1st Diag lesion is 85% stenosed.   Ramus lesion is 99% stenosed.   Ost Ramus to Ramus lesion is 90% stenosed.   Non-stenotic Ost RCA to Mid RCA lesion was previously treated.   A drug-eluting  stent was successfully placed using a STENT ONYX FRONTIER 3.0X15.   A drug-eluting stent was successfully placed using a SYNERGY XD 3.0X16.   A drug-eluting stent was successfully placed using a SYNERGY XD 3.0X20.   Post intervention, there is a 0% residual stenosis.   Post intervention, there is a 0% residual stenosis.   The left ventricular systolic function is normal.   LV end diastolic pressure is normal.   The left ventricular ejection fraction is 55-65% by visual estimate.    The radiation dose exceeded thresholds defined in the "Patient Radiation Dose Management For Interventional Medical Procedures With Extensive Use of Fluoroscopy" policy. Specific follow up instructions will be provided to the patient prior to discharge.   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 This is the culprit Normal LV function Normal LVEDP Successful PCI of the Ramus intermediate with DES x 3 in overlapping fashion.   Plan: DAPT for one year. Anticipate possible DC tomorrow.   Diagnostic Dominance: Right Intervention   Cardiac cath 11/11/21:  LEFT HEART CATH AND CORONARY ANGIOGRAPHY   Conclusion      Ost LAD to Prox LAD lesion is 60% stenosed. Prox LAD lesion is 100% stenosed.   LIMA-graft was visualized by angiography and is normal in caliber.  The graft exhibits no disease.   SVG-dRCA: Origin lesion is 100% stenosed.  graft was not visualized due to known occlusion.   Ost RCA to Mid RCA overlapping full metal jacket DES stents, widely patent.   Ost 1st Diag to 1st Diag lesion is 85% stenosed.   ----------------------   Lesion segment #2 (all in-stent restenosis) Ost Ramus to Ramus lesion is 75% stenosed.  Ramus-2 lesion is 85% stenosed.  Ramus-1 lesion is 35% stenosed.   Scoring balloon angioplasty was performed using a BALLN SCOREFLEX 2.50X15 -> followed by post dilation with a [place] Balloon 3.0 mm x 15 mm   Post intervention, there is a 20% residual stenosis  in the proximal portion of stent segment.  Post intervention, there is a 0% residual stenosis in the distal portion of the stent.   CULPRIT LESION #1: Ramus-3 lesion is 95% stenosed.-2 tandem locations of calcified stenosis.   Scoring balloon angioplasty was performed using a BALLN SCOREFLEX 2.50X15.   A drug-eluting stent was successfully placed (overlapping the distal  stent) using a STENT ONYX FRONTIER 2.5X30 -> tapered postdilation from overlap 3.1 mm down to 2.8 mm distally.   Post intervention, there is a 0% residual stenosis.   ------------------------------------------------------------------------------   LV end diastolic pressure is normal.   There is no aortic valve stenosis.   SUMMARY Severe Native vessel CAD: stable severe proximal to mid LAD disease with LAD occlusion and patent LIMA-LAD,  widely patent full metal jacket stented RCA,  diffuse severe in-stent restenosis throughout the proximal and midportion of the stented segment of the native LCx with severe eccentric calcified lesions distal to the stent. Difficult/complex but successful scoring balloon angioplasty with shockwave lithotripsy followed by distal overlapping DES stent placement (Onyx Frontier DES 2.5 x 30 tapered postdilation from 3.1 in the overlap segment to 2.8 distally) Scoreflex angioplasty followed by post dilation with 3.0 mm  balloon throughout the entire stented segment of the LCx reducing stenosis in the midportion to 10% and more proximally to 20%. Normal LVEDP   RECOMMENDATION Monitor overnight,  anticipate discharge in the morning. Aggressive risk factor modification Essentially lifelong finding.  In coverage with DAPT x1 year       Bryan Lemma, MD   Echo 02/04/23:  IMPRESSIONS     1. Left ventricular ejection fraction, by estimation, is 50 to 55%. The  left ventricle has low normal function. The left ventricle demonstrates  regional wall motion abnormalities (see scoring diagram/findings for  description). Left ventricular diastolic   parameters are consistent with Grade I diastolic dysfunction (impaired  relaxation).   2. Right ventricular systolic function is normal. The right ventricular  size is normal. Tricuspid regurgitation signal is inadequate for assessing  PA pressure.   3. The mitral valve is normal in structure. Mild to moderate mitral  valve  regurgitation. No evidence of mitral stenosis.   4. The aortic valve is tricuspid. Aortic valve regurgitation is not  visualized. No aortic stenosis is present.   5. The inferior vena cava is normal in size with greater than 50%  respiratory variability, suggesting right atrial pressure of 3 mmHg.      Myoview 02/06/23: IMPRESSION: 1. No reversible ischemia or infarction.   2. Normal left ventricular wall motion.   3. Left ventricular ejection fraction 61%   4. Non invasive risk stratification*: Low ASSESSMENT AND PLAN:  1.  History of  orthostatic hypotension/and/or neurocardiogenic syncope. Pacer evaluation has been normal. Recommend liberalization of sodium in diet. Maintain good hydration. Need to allow more permissive BP. Currently doing very well.   2. CAD. S/p CABG. S/p extensive stenting of RCA.  in 2017. S/p NSTEMI with complex stenting of a large ramus intermediate branch with DES x 3 in September 2022.  Patent LIMA to LAD. Patent stents in RCA. Recommend indefinite DAPT. On ASA and Effient indefinitely. Admitted in May 2023 with NSTEMI and complex PCI of the LCx by Dr Herbie Baltimore. Admitted this summer with chest pain. MI ruled out. Normal Myoview. EGD c/w severe esophagitis. Continue current therapy. On long term DAPT. She is > 1 year out from last stent so it would be ok to hold Effient for one week prior to colonoscopy.   3. Hyperlipidemia. LDL goal < 70. Last LDL 60. Excellent response with Repatha with 50% reduction in LDL  Will repeat labs today  4. SSS. S/p pacemaker. Followed in device clinic.  5. Acute upper GI bleed requiring transfusion. S/p clipping of visible vessel in gastric antrum. More recent EGD in May showed gastric ulcer (nonbleeding) and gastritis.  On PPI. Hgb improved to normal in July 12.3.  no recurrence. Will need to continue DAPT. Check Hgb today  6. History of seizures. Monitored by Dr Anne Hahn.   7. Chronic mild hyponatremia.   8. Memory loss.     Current medicines are reviewed at length with the patient today.  The patient does not have concerns regarding medicines.  The following changes have been made:  See above.  Labs/ tests ordered today include: none  No orders of the defined types were placed in this encounter.     Disposition:   FU with APP in 3 months  Signed, Isiac Breighner [place], MD  04/13/2023 8:31 AM    [facility] [address] [place], [place], [address] Phone [phone], Fax [phone]

## 2023-04-11 DIAGNOSIS — D500 Iron deficiency anemia secondary to blood loss (chronic): Secondary | ICD-10-CM | POA: Diagnosis not present

## 2023-04-11 DIAGNOSIS — I495 Sick sinus syndrome: Secondary | ICD-10-CM | POA: Diagnosis not present

## 2023-04-11 DIAGNOSIS — I10 Essential (primary) hypertension: Secondary | ICD-10-CM | POA: Diagnosis not present

## 2023-04-11 DIAGNOSIS — E785 Hyperlipidemia, unspecified: Secondary | ICD-10-CM | POA: Diagnosis not present

## 2023-04-11 DIAGNOSIS — G40409 Other generalized epilepsy and epileptic syndromes, not intractable, without status epilepticus: Secondary | ICD-10-CM | POA: Diagnosis not present

## 2023-04-11 DIAGNOSIS — I2510 Atherosclerotic heart disease of native coronary artery without angina pectoris: Secondary | ICD-10-CM | POA: Diagnosis not present

## 2023-04-13 ENCOUNTER — Ambulatory Visit: Payer: Medicare Other | Attending: Cardiology | Admitting: Cardiology

## 2023-04-13 ENCOUNTER — Encounter: Payer: Self-pay | Admitting: Cardiology

## 2023-04-13 VITALS — BP 116/64 | HR 90 | Ht 68.0 in | Wt 173.0 lb

## 2023-04-13 DIAGNOSIS — Z950 Presence of cardiac pacemaker: Secondary | ICD-10-CM | POA: Insufficient documentation

## 2023-04-13 DIAGNOSIS — I25118 Atherosclerotic heart disease of native coronary artery with other forms of angina pectoris: Secondary | ICD-10-CM | POA: Insufficient documentation

## 2023-04-13 DIAGNOSIS — Z8719 Personal history of other diseases of the digestive system: Secondary | ICD-10-CM | POA: Diagnosis not present

## 2023-04-13 DIAGNOSIS — I495 Sick sinus syndrome: Secondary | ICD-10-CM | POA: Insufficient documentation

## 2023-04-13 DIAGNOSIS — E785 Hyperlipidemia, unspecified: Secondary | ICD-10-CM | POA: Diagnosis not present

## 2023-04-13 LAB — LIPID PANEL
Chol/HDL Ratio: 3.2 {ratio} (ref 0.0–4.4)
Cholesterol, Total: 270 mg/dL — ABNORMAL HIGH (ref 100–199)
HDL: 85 mg/dL (ref >39)
LDL Chol Calc (NIH): 157 mg/dL — ABNORMAL HIGH (ref 0–99)
Triglycerides: 160 mg/dL — ABNORMAL HIGH (ref 0–149)
VLDL Cholesterol Cal: 28 mg/dL (ref 5–40)

## 2023-04-13 LAB — COMPREHENSIVE METABOLIC PANEL
ALT: 13 [IU]/L (ref 0–32)
AST: 13 [IU]/L (ref 0–40)
Albumin: 4.3 g/dL (ref 3.8–4.8)
Alkaline Phosphatase: 86 [IU]/L (ref 44–121)
BUN/Creatinine Ratio: 19 (ref 12–28)
BUN: 15 mg/dL (ref 8–27)
Bilirubin Total: 0.2 mg/dL (ref 0.0–1.2)
CO2: 23 mmol/L (ref 20–29)
Calcium: 9.6 mg/dL (ref 8.7–10.3)
Chloride: 99 mmol/L (ref 96–106)
Creatinine, Ser: 0.79 mg/dL (ref 0.57–1.00)
Globulin, Total: 2.9 g/dL (ref 1.5–4.5)
Glucose: 112 mg/dL — ABNORMAL HIGH (ref 70–99)
Potassium: 4.8 mmol/L (ref 3.5–5.2)
Sodium: 136 mmol/L (ref 134–144)
Total Protein: 7.2 g/dL (ref 6.0–8.5)
eGFR: 79 mL/min/{1.73_m2} (ref >59)

## 2023-04-13 LAB — CBC
Hematocrit: 44.8 % (ref 34.0–46.6)
Hemoglobin: 14.4 g/dL (ref 11.1–15.9)
MCH: 27.7 pg (ref 26.6–33.0)
MCHC: 32.1 g/dL (ref 31.5–35.7)
MCV: 86 fL (ref 79–97)
Platelets: 307 10*3/uL (ref 150–450)
RBC: 5.2 x10E6/uL (ref 3.77–5.28)
RDW: 16.8 % — ABNORMAL HIGH (ref 11.7–15.4)
WBC: 5.8 10*3/uL (ref 3.4–10.8)

## 2023-04-13 NOTE — Patient Instructions (Signed)
Medication Instructions:  Continue taking medications  *If you need a refill on your cardiac medications before your next appointment, please call your pharmacy*   Lab Work: CBC Cmp Lipid  If you have labs (blood work) drawn today and your tests are completely normal, you will receive your results only by: MyChart Message (if you have MyChart) OR A paper copy in the mail If you have any lab test that is abnormal or we need to change your treatment, we will call you to review the results.   Testing/Procedures: none   Follow-Up: At [facility], you and your health needs are our priority.  As part of our continuing mission to provide you with exceptional heart care, we have created designated Provider Care Teams.  These Care Teams include your primary Cardiologist (physician) and Advanced Practice Providers (APPs -  Physician Assistants and Nurse Practitioners) who all work together to provide you with the care you need, when you need it.  We recommend signing up for the patient portal called "MyChart".  Sign up information is provided on this After Visit Summary.  MyChart is used to connect with patients for Virtual Visits (Telemedicine).  Patients are able to view lab/test results, encounter notes, upcoming appointments, etc.  Non-urgent messages can be sent to your provider as well.   To learn more about what you can do with MyChart, go to [email or web address].    Your next appointment:   Follow up in 3 months with PA   Provider:   DR. Swaziland

## 2023-04-16 ENCOUNTER — Ambulatory Visit: Payer: Medicare Other | Admitting: Nurse Practitioner

## 2023-04-16 ENCOUNTER — Other Ambulatory Visit: Payer: Self-pay | Admitting: Cardiology

## 2023-04-17 ENCOUNTER — Telehealth: Payer: Self-pay | Admitting: Cardiology

## 2023-04-17 DIAGNOSIS — E785 Hyperlipidemia, unspecified: Secondary | ICD-10-CM

## 2023-04-17 NOTE — Telephone Encounter (Signed)
Husband calling back to speak to the nurse about patient meds, stated he spoke on yesterday. Please advise

## 2023-04-17 NOTE — Telephone Encounter (Signed)
Spoke to patient's husband.He stated he just noticed his wife has not been taking Nexlizet and Imdur.He stated she has dementia.He will start helping her with her medications.She will repeat fasting lipid and hepatic panels in 3 months.Lab orders mailed.

## 2023-04-25 NOTE — Progress Notes (Unsigned)
Patient: Rachel Clark Date of Birth: 1951-05-13  Reason for Visit: Follow up History from: Patient, husband Primary Neurologist: Penumalli  ASSESSMENT AND PLAN 72 y.o. year old female   1.  Seizure disorder -Last seizure was over 50 years ago -She is reluctant to make any changes to seizure medication, will continue carbamazepine 600 mg twice daily, ethosuximide 500 mg twice a day -Back in March 2024 planning to reduce carbamazepine 400 mg twice daily, never reduced the medication, came back shortly after with confusion, jerks, unclear etiology, but they resolved.  Question if she may have missed a few days of her carbamazepine when she ran out/taking her sons?  Husband now overseeing medications.  Baseline hyponatremia was factor and considering dose reduction of carbamazepine. -Will check routine labs at next visit  2.  Memory loss, neuropsychological testing May 2024 with Dr. Jacquelyne Balint suggested Alzheimer's disease (other factors likely contributing-stress, fibromyalgia, mood disorder, polypharmacy).   A+, T+, N-. -MoCA 18/30 today -Continue Namenda 10 mg twice daily -Discussed Aricept, hold off for now, potential lower seizure threshold -Recommend exercise, brain stimulating activity, healthy eating, drinking plenty of water -Follow-up in 6 months  Meds ordered this encounter  Medications   memantine (NAMENDA) 10 MG tablet    Sig: Take 1 tablet (10 mg total) by mouth 2 (two) times daily.    Dispense:  180 tablet    Refill:  4   DISCONTD: ethosuximide (ZARONTIN) 250 MG capsule    Sig: Take 2 capsules (500 mg total) by mouth 2 (two) times daily. TAKE 2 CAPSULES IN THE MORNING, TAKE 2 CAPSULES AT NIGHT    Dispense:  360 capsule    Refill:  3   carbamazepine (TEGRETOL XR) 200 MG 12 hr tablet    Sig: Take 3 tablets (600 mg total) by mouth 2 (two) times daily.    Dispense:  540 tablet    Refill:  4   ethosuximide (ZARONTIN) 250 MG capsule    Sig: Take 2 capsules (500 mg  total) by mouth 2 (two) times daily. TAKE 2 CAPSULES IN THE MORNING, TAKE 2 CAPSULES AT NIGHT    Dispense:  360 capsule    Refill:  3   HISTORY OF PRESENT ILLNESS: Update 04/26/23 SS: A+, T+, N-. Their son living with them, he has seizures, just had DBS, their normal is far from normal. Saw Dr. Mirna Mires in March for confusion, jerks, no etiology, it resolved. EEG CT scan were fine. No medication changes. Seizure free over 50 years! 04/13/23 sodium 136. MOCA 18/30 today, ask the same questions. On Namenda.   Neuropsych testing May 2024 with Dr. Orie Fisherman. Mrs. Vannostrand's performance unfortunately evidences major neurocognitive challenges at the level of dementia. With regard to etiology, there is concern for a neurodegenerative process with Alzheimer's disease being most probable and most salient. Other factors present (e.g., fibromyalgia/mood disorder, stressors, polypharmacy, etc.) could be contributing but are unlikely the driving force behind these findings.   HISTORY  UPDATE (11/06/22, VRP): Since last visit, doing better!. No more myoclonus. Memory loss continues.    UPDATE (09/25/22, VRP): Since last visit, stable until yesterday. New onset of confusion, muscle jerks. She did not reduce her CBZ dosing as per Margie Ege recommendation on 09/19/22 phone call. 09/25/22. Some lightheadedness, dizziness.    UPDATE (09/13/21, VRP): Since last visit, doing well. Symptoms are stable. No more seizures. Memory loss stable. No major changes in ADLs. No more syncope attacks.  REVIEW OF SYSTEMS: Out of a complete  14 system review of symptoms, the patient complains only of the following symptoms, and all other reviewed systems are negative.  See HPI  ALLERGIES: Allergies  Allergen Reactions   Sulfamethoxazole Anaphylaxis    Don't recall   Aspirin Other (See Comments)    GI upset- can tolerate 81 mg ASA, just not full doses   Crestor [Rosuvastatin]     myalgias   Erythromycin Nausea Only    Gi  upset   Lisinopril Cough   Niacin And Related Other (See Comments)    Don't recall   Penicillin G Benzathine     Other reaction(s): Unknown   Statins Other (See Comments)    Muscle pain   Sulfa Drugs Cross Reactors Swelling    Don't recall   Tetracyclines & Related Nausea Only   Xeloda [Capecitabine] Diarrhea   Penicillins Nausea Only and Rash        Plavix [Clopidogrel Bisulfate] Rash    HOME MEDICATIONS: Outpatient Medications Prior to Visit  Medication Sig Dispense Refill   acetaminophen (TYLENOL) 500 MG tablet Take 500-1,000 mg by mouth daily as needed for mild pain or headache.     amLODipine (NORVASC) 5 MG tablet Take 1 tablet (5 mg total) by mouth daily. 90 tablet 1   aspirin 81 MG chewable tablet Chew 1 tablet (81 mg total) by mouth daily.     Azelastine HCl 0.15 % SOLN Place 1 spray into both nostrils daily as needed (seasonal allergies).  12   Bempedoic Acid-Ezetimibe (NEXLIZET) 180-10 MG TABS Take 180 mg by mouth daily. 90 tablet 3   calcium carbonate (TUMS - DOSED IN MG ELEMENTAL CALCIUM) 500 MG chewable tablet Chew 500 mg by mouth daily.     Cholecalciferol (VITAMIN D) 50 MCG (2000 UT) CAPS Take 4,000 Units by mouth daily.     diphenhydrAMINE (BENADRYL) 25 MG tablet Take 25 mg by mouth daily as needed for allergies.     diphenhydramine-acetaminophen (TYLENOL PM) 25-500 MG TABS tablet Take 1 tablet by mouth at bedtime as needed (Sleep).     DULoxetine (CYMBALTA) 60 MG capsule Take 60 mg by mouth 2 (two) times daily.     Evolocumab (REPATHA SURECLICK) 140 MG/ML SOAJ Inject 140 mg into the skin every 14 (fourteen) days. 2 mL 11   ferrous sulfate 325 (65 FE) MG EC tablet Take 325 mg by mouth daily with breakfast.     fluticasone (FLONASE) 50 MCG/ACT nasal spray Place 1 spray into both nostrils daily as needed for allergies.     isosorbide mononitrate (IMDUR) 30 MG 24 hr tablet Take one-half tablet by mouth every day 45 tablet 3   levothyroxine (SYNTHROID, LEVOTHROID) 50  MCG tablet Take 50 mcg by mouth daily before breakfast.     nitroGLYCERIN (NITROSTAT) 0.4 MG SL tablet place 1 tablet under the tongue every 5 minutes as needed for chest pain 25 tablet 3   Oral Electrolytes (THERMOTABS PO) Take 452 mg by mouth daily. Thermotabs 452 mg sodium chloride     pantoprazole (PROTONIX) 40 MG tablet Take 1 tablet (40 mg total) by mouth daily. 120 tablet 1   prasugrel (EFFIENT) 10 MG TABS tablet Take 1 tablet (10 mg total) by mouth daily. 90 tablet 3   carbamazepine (TEGRETOL XR) 200 MG 12 hr tablet Take 3 tablets (600 mg total) by mouth 2 (two) times daily. 540 tablet 4   ethosuximide (ZARONTIN) 250 MG capsule TAKE 2 CAPSULES IN THE MORNING, TAKE 2 CAPSULES AT NIGHT (Patient taking differently:  Take 500 mg by mouth See admin instructions. Take 2 capsules in the morning and then take 2 capsules by mouth at night) 360 capsule 3   memantine (NAMENDA) 10 MG tablet Take 1 tablet (10 mg total) by mouth 2 (two) times daily. 180 tablet 4   No facility-administered medications prior to visit.    PAST MEDICAL HISTORY: Past Medical History:  Diagnosis Date   Arthritis    "fingers" (06/29/2016)   Chronic lower back pain    Colon cancer (HCC) 12/04/2011   s/p Laparoscopic-assisted transverse colectomy on 12/19/2011 by Dr. Dwain Sarna.  pT3 N0 M0.    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   last seizure was in 1972 ;takes Tegretol (06/29/2016)    Headache    "weekly" (06/29/2016)   Hyperlipidemia    Hyponatremia    Hypothyroid    Iron deficiency anemia 11/17/2011   Memory change 11/13/2016   Monoclonal gammopathy of unknown significance    Orthostatic hypotension    Presence of permanent cardiac pacemaker    Syncope and collapse    pacemaker implanted    PAST SURGICAL HISTORY: Past  Surgical History:  Procedure Laterality Date   ANTERIOR CERVICAL DECOMP/DISCECTOMY FUSION  1980's   BACK SURGERY     CARDIAC CATHETERIZATION  05/20/2009   obstructive native vessel disease in LAD, RCA, and first diagonal, patent vein graft to distal RCA and LIMA to LAD,normal. ef 60%   CARDIAC CATHETERIZATION N/A 03/24/2015   Procedure: Left Heart Cath and Cors/Grafts Angiography;  Surgeon: Iran Ouch, MD;  Location: [facility] INVASIVE CV LAB;  Service: Cardiovascular;  Laterality: N/A;   CARDIAC CATHETERIZATION N/A 07/28/2015   Procedure: Left Heart Cath and Coronary Angiography;  Surgeon: Lennette Bihari, MD;  Location: [facility] INVASIVE CV LAB;  Service: Cardiovascular;  Laterality: N/A;   CARDIAC CATHETERIZATION N/A 11/09/2015   Procedure: Coronary Stent Intervention;  Surgeon: Tonny Bollman, MD;  Location: [facility] INVASIVE CV LAB;  Service: Cardiovascular;  Laterality: N/A;   CARDIAC CATHETERIZATION N/A 11/09/2015   Procedure: Left Heart Cath and Coronary Angiography;  Surgeon: Tonny Bollman, MD;  Location: [facility] INVASIVE CV LAB;  Service: Cardiovascular;  Laterality: N/A;   CARDIAC CATHETERIZATION N/A 06/29/2016   Procedure: Left Heart Cath and Coronary Angiography;  Surgeon: Yvonne Kendall, MD;  Location: [facility] INVASIVE CV LAB;  Service: Cardiovascular;  Laterality: N/A;   CARDIAC CATHETERIZATION N/A 06/29/2016   Procedure: Intravascular Pressure Wire/FFR Study;  Surgeon: Yvonne Kendall, MD;  Location: [facility] INVASIVE CV LAB;  Service: Cardiovascular;  Laterality: N/A;   CARDIAC CATHETERIZATION N/A 06/29/2016   Procedure: Coronary Balloon Angioplasty;  Surgeon: Yvonne Kendall, MD;  Location: [facility] INVASIVE CV LAB;  Service: Cardiovascular;  Laterality: N/A;   COLON RESECTION  12/19/2011   Procedure: COLON RESECTION LAPAROSCOPIC;  Surgeon: Emelia Loron, MD;  Location: [facility] OR;  Service: General;  Laterality: N/A;  laparoscopic hand assisted partial colon resection   COLON SURGERY     COLONOSCOPY     CORONARY  ANGIOPLASTY WITH STENT PLACEMENT  ~ 2007   1 stent   CORONARY ARTERY BYPASS GRAFT  2007   "CABG X2"   DILATION AND CURETTAGE OF UTERUS  1973   EP IMPLANTABLE DEVICE N/A 03/25/2015   MDT Advisa DR pacemaker implanted by  Dr Johney Frame for transient complete heart block and syncope   ESOPHAGOGASTRODUODENOSCOPY (EGD) WITH PROPOFOL N/A 03/14/2021   Procedure: ESOPHAGOGASTRODUODENOSCOPY (EGD) WITH PROPOFOL;  Surgeon: Kerin Salen, MD;  Location: [facility] ENDOSCOPY;  Service: Gastroenterology;  Laterality: N/A;   ESOPHAGOGASTRODUODENOSCOPY (EGD) WITH PROPOFOL N/A 11/13/2021   Procedure: ESOPHAGOGASTRODUODENOSCOPY (EGD) WITH PROPOFOL;  Surgeon: Kathi Der, MD;  Location: [facility] ENDOSCOPY;  Service: Gastroenterology;  Laterality: N/A;   ESOPHAGOGASTRODUODENOSCOPY (EGD) WITH PROPOFOL N/A 02/07/2023   Procedure: ESOPHAGOGASTRODUODENOSCOPY (EGD) WITH PROPOFOL;  Surgeon: Kathi Der, MD;  Location: [facility] ENDOSCOPY;  Service: Gastroenterology;  Laterality: N/A;   HEMOSTASIS CLIP PLACEMENT  03/14/2021   Procedure: HEMOSTASIS CLIP PLACEMENT;  Surgeon: Kerin Salen, MD;  Location: [facility] ENDOSCOPY;  Service: Gastroenterology;;   INSERT / REPLACE / REMOVE PACEMAKER     LEFT HEART CATH AND CORONARY ANGIOGRAPHY N/A 11/11/2021   Procedure: LEFT HEART CATH AND CORONARY ANGIOGRAPHY;  Surgeon: Marykay Lex, MD;  Location: [facility] INVASIVE CV LAB;  Service: Cardiovascular;  Laterality: N/A;   LEFT HEART CATH AND CORS/GRAFTS ANGIOGRAPHY N/A 02/21/2018   Procedure: LEFT HEART CATH AND CORS/GRAFTS ANGIOGRAPHY;  Surgeon: Runell Gess, MD;  Location: [facility] INVASIVE CV LAB;  Service: Cardiovascular;  Laterality: N/A;   LEFT HEART CATH AND CORS/GRAFTS ANGIOGRAPHY N/A 03/08/2021   Procedure: LEFT HEART CATH AND CORS/GRAFTS ANGIOGRAPHY;  Surgeon: Swaziland, Peter M, MD;  Location: [facility] INVASIVE CV LAB;  Service: Cardiovascular;  Laterality: N/A;   PORT-A-CATH REMOVAL N/A 06/12/2013   Procedure: REMOVAL PORT-A-CATH;  Surgeon: Emelia Loron, MD;   Location: [facility];  Service: General;  Laterality: N/A;   PORTACATH PLACEMENT  06/04/2012   Procedure: INSERTION PORT-A-CATH;  Surgeon: Emelia Loron, MD;  Location: [facility] OR;  Service: General;  Laterality: N/A;  Insertion of port-a-cath    POSTERIOR LAMINECTOMY / DECOMPRESSION CERVICAL SPINE  1990s   SCLEROTHERAPY  03/14/2021   Procedure: SCLEROTHERAPY;  Surgeon: Kerin Salen, MD;  Location: [facility] ENDOSCOPY;  Service: Gastroenterology;;   VAGINAL HYSTERECTOMY      FAMILY HISTORY: Family History  Problem Relation Age of Onset   Heart attack Father    Heart disease Father    Heart attack Brother 50   Cancer Mother        lung   Stroke Neg Hx     SOCIAL HISTORY: Social History   Socioeconomic History   Marital status: Married    Spouse name: Not on file   Number of children: 2   Years of education: 12   Highest education level: Not on file  Occupational History   Occupation: Retired  Tobacco Use   Smoking status: Former    Current packs/day: 0.00    Average packs/day: 0.3 packs/day for 20.0 years (6.0 ttl pk-yrs)    Types: Cigarettes    Start date: 06/06/1961    Quit date: 06/06/1981    Years since quitting: 41.9   Smokeless tobacco: Never  Vaping Use   Vaping status: Never Used  Substance and Sexual Activity   Alcohol use: Yes    Alcohol/week: 1.0 standard drink of alcohol    Types: 1 Glasses of wine per week    Comment: occ   Drug use: No   Sexual activity: Not Currently    Birth control/protection: Surgical  Other Topics Concern   Not on file  Social History Narrative   Patient is married with 2 children.   Patient is right handed.   Patient has a high school education with some college education.   Patient  drinks 2 cups daily.   Social Determinants of Health   Financial Resource Strain: Not on file  Food Insecurity: No Food Insecurity (02/03/2023)   Hunger Vital Sign    Worried About Running Out of Food in the Last Year: Never true    Ran  Out of Food in the Last Year: Never true  Transportation Needs: No Transportation Needs (02/03/2023)   PRAPARE - Administrator, Civil Service (Medical): No    Lack of Transportation (Non-Medical): No  Physical Activity: Not on file  Stress: Not on file  Social Connections: Not on file  Intimate Partner Violence: Not At Risk (02/03/2023)   Humiliation, Afraid, Rape, and Kick questionnaire    Fear of Current or Ex-Partner: No    Emotionally Abused: No    Physically Abused: No    Sexually Abused: No   PHYSICAL EXAM  Vitals:   04/26/23 1015  BP: (!) 143/84  Pulse: 90  Weight: 173 lb (78.5 kg)  Height: 5\' 8"  (1.727 m)   Body mass index is 26.3 kg/m.    04/26/2023   10:16 AM 09/14/2022   10:28 AM  Montreal Cognitive Assessment   Visuospatial/ Executive (0/5) 4 2  Naming (0/3) 2 3  Attention: Read list of digits (0/2) 2 2  Attention: Read list of letters (0/1) 1 1  Attention: Serial 7 subtraction starting at 100 (0/3) 2 1  Language: Repeat phrase (0/2) 1 1  Language : Fluency (0/1) 0 1  Abstraction (0/2) 2 2  Delayed Recall (0/5) 0 3  Orientation (0/6) 4 2  Total 18 18   Generalized: Well developed, in no acute distress  Neurological examination  Mentation: Alert, oriented, interactive, most history is provided by her husband.  Her speech is clear. Cranial nerve II-XII: Pupils were equal round reactive to light. Extraocular movements were full, visual field were full on confrontational test. Facial sensation and strength were normal. Head turning and shoulder shrug  were normal and symmetric. Motor: The motor testing reveals 5 over 5 strength of all 4 extremities. Good symmetric motor tone is noted throughout.  Sensory: Sensory testing is intact to soft touch on all 4 extremities. No evidence of extinction is noted.  Coordination: Cerebellar testing reveals good finger-nose-finger and heel-to-shin bilaterally.  Gait and station: Gait is normal.  Reflexes: Deep  tendon reflexes are symmetric and normal bilaterally.   DIAGNOSTIC DATA (LABS, IMAGING, TESTING) - I reviewed patient records, labs, notes, testing and imaging myself where available.  Lab Results  Component Value Date   WBC 5.8 04/13/2023   HGB 14.4 04/13/2023   HCT 44.8 04/13/2023   MCV 86 04/13/2023   PLT 307 04/13/2023      Component Value Date/Time   NA 136 04/13/2023 0845   NA 137 07/11/2016 0820   K 4.8 04/13/2023 0845   K 4.4 07/11/2016 0820   CL 99 04/13/2023 0845   CL 106 12/25/2012 0924   CO2 23 04/13/2023 0845   CO2 27 07/11/2016 0820   GLUCOSE 112 (H) 04/13/2023 0845   GLUCOSE 103 (H) 02/07/2023 0743   GLUCOSE 115 07/11/2016 0820   GLUCOSE 105 (H) 12/25/2012 0924   BUN 15 04/13/2023 0845   BUN 15.6 07/11/2016 0820   CREATININE 0.79 04/13/2023 0845   CREATININE 0.85 01/05/2023 0915   CREATININE 0.8 07/11/2016 0820   CALCIUM 9.6 04/13/2023 0845   CALCIUM 8.9 07/11/2016 0820   PROT 7.2 04/13/2023 0845   PROT 7.1 07/11/2016 0820  ALBUMIN 4.3 04/13/2023 0845   ALBUMIN 3.6 07/11/2016 0820   AST 13 04/13/2023 0845   AST 12 (L) 01/05/2023 0915   AST 13 07/11/2016 0820   ALT 13 04/13/2023 0845   ALT 8 01/05/2023 0915   ALT 14 07/11/2016 0820   ALKPHOS 86 04/13/2023 0845   ALKPHOS 89 07/11/2016 0820   BILITOT 0.2 04/13/2023 0845   BILITOT 0.4 01/05/2023 0915   BILITOT 0.29 07/11/2016 0820   GFRNONAA >60 02/07/2023 0743   GFRNONAA >60 01/05/2023 0915   GFRAA 97 08/17/2020 1111   Lab Results  Component Value Date   CHOL 270 (H) 04/13/2023   HDL 85 04/13/2023   LDLCALC 157 (H) 04/13/2023   TRIG 160 (H) 04/13/2023   CHOLHDL 3.2 04/13/2023   Lab Results  Component Value Date   HGBA1C 5.5 02/20/2018   Lab Results  Component Value Date   VITAMINB12 625 09/25/2022   Lab Results  Component Value Date   TSH 1.782 11/10/2021    Margie Ege, AGNP-C, DNP 04/26/2023, 11:23 AM [facility] [address] [place], [place]  [address] [phone]

## 2023-04-26 ENCOUNTER — Telehealth: Payer: Self-pay | Admitting: Cardiology

## 2023-04-26 ENCOUNTER — Ambulatory Visit: Payer: Medicare Other | Admitting: Neurology

## 2023-04-26 ENCOUNTER — Ambulatory Visit (INDEPENDENT_AMBULATORY_CARE_PROVIDER_SITE_OTHER): Payer: Medicare Other | Admitting: Neurology

## 2023-04-26 ENCOUNTER — Encounter: Payer: Self-pay | Admitting: Neurology

## 2023-04-26 VITALS — BP 143/84 | HR 90 | Ht 68.0 in | Wt 173.0 lb

## 2023-04-26 DIAGNOSIS — G308 Other Alzheimer's disease: Secondary | ICD-10-CM | POA: Diagnosis not present

## 2023-04-26 DIAGNOSIS — F0390 Unspecified dementia without behavioral disturbance: Secondary | ICD-10-CM | POA: Insufficient documentation

## 2023-04-26 DIAGNOSIS — R569 Unspecified convulsions: Secondary | ICD-10-CM | POA: Diagnosis not present

## 2023-04-26 DIAGNOSIS — F02B3 Dementia in other diseases classified elsewhere, moderate, with mood disturbance: Secondary | ICD-10-CM

## 2023-04-26 MED ORDER — ETHOSUXIMIDE 250 MG PO CAPS: 500.0000 mg | ORAL_CAPSULE | Freq: Two times a day (BID) | ORAL | 3 refills | Status: DC

## 2023-04-26 MED ORDER — MEMANTINE HCL 10 MG PO TABS: 10.0000 mg | ORAL_TABLET | Freq: Two times a day (BID) | ORAL | 4 refills | Status: DC

## 2023-04-26 MED ORDER — CARBAMAZEPINE ER 200 MG PO TB12: 600.0000 mg | ORAL_TABLET | Freq: Two times a day (BID) | ORAL | 4 refills | Status: DC

## 2023-04-26 NOTE — Telephone Encounter (Signed)
Spoke to husband Dr.Jordan's advice given.

## 2023-04-26 NOTE — Patient Instructions (Addendum)
We will continue current medications.  Call for any seizure activity.  Recommend exercise, brain stimulating activity, healthy eating, drinking plenty of water

## 2023-04-26 NOTE — Telephone Encounter (Signed)
Please advise 

## 2023-04-26 NOTE — Telephone Encounter (Signed)
Pt c/o medication issue:  1. Name of Medication:  aspirin 81 MG chewable tablet    2. How are you currently taking this medication (dosage and times per day)? Chew 1 tablet (81 mg total) by mouth daily.   3. Are you having a reaction (difficulty breathing--STAT)? No    4. What is your medication issue? Husband would like a c/b  to know if she can take coated aspirin instead.

## 2023-05-02 DIAGNOSIS — D225 Melanocytic nevi of trunk: Secondary | ICD-10-CM | POA: Diagnosis not present

## 2023-05-02 DIAGNOSIS — Z1283 Encounter for screening for malignant neoplasm of skin: Secondary | ICD-10-CM | POA: Diagnosis not present

## 2023-05-02 DIAGNOSIS — C4441 Basal cell carcinoma of skin of scalp and neck: Secondary | ICD-10-CM | POA: Diagnosis not present

## 2023-05-08 ENCOUNTER — Other Ambulatory Visit: Payer: Self-pay | Admitting: Cardiology

## 2023-05-08 DIAGNOSIS — K209 Esophagitis, unspecified without bleeding: Secondary | ICD-10-CM | POA: Diagnosis not present

## 2023-05-08 DIAGNOSIS — Z7901 Long term (current) use of anticoagulants: Secondary | ICD-10-CM | POA: Diagnosis not present

## 2023-05-08 DIAGNOSIS — Z860101 Personal history of adenomatous and serrated colon polyps: Secondary | ICD-10-CM | POA: Diagnosis not present

## 2023-05-09 ENCOUNTER — Telehealth: Payer: Self-pay | Admitting: *Deleted

## 2023-05-09 NOTE — Telephone Encounter (Signed)
Good Morning Dr. Swaziland,  We have received a surgical clearance request for colonoscopy and endoscopy. They were seen recently in clinic on 04/13/2023.  She has a PMH of CAD s/p remote PCI-RCA, CABG in 2007, DES x 3 (full metal jacket)-RCA in 2017, DES x 3-RI in 2022, PCI-LCx in 2023.  Can you please comment on surgical clearance and guidance on holding prasugrel and aspirin. Please forward you guidance and recommendations to P CV DIV PREOP   [name], NP

## 2023-05-09 NOTE — Telephone Encounter (Signed)
   Patient Name: Rachel Clark  DOB: [birth year] MRN: [id number]  Primary Cardiologist: Peter Swaziland, MD  Chart reviewed as part of pre-operative protocol coverage. Given past medical history and time since last visit, based on ACC/AHA guidelines, Rachel Clark is at acceptable risk for the planned procedure without further cardiovascular testing.   Per Dr. Swaziland patient can hold prasugrel 7 days prior to procedure but should continue ASA 81 mg throughout the perioperative period with no interruption.  The patient was advised that if she develops new symptoms prior to surgery to contact our office to arrange for a follow-up visit, and she verbalized understanding.  I will route this recommendation to the requesting party via Epic fax function and remove from pre-op pool.  Please call with questions.  Napoleon Form, Leodis Rains, NP 05/09/2023, 1:51 PM

## 2023-05-09 NOTE — Telephone Encounter (Signed)
   Pre-operative Risk Assessment    Patient Name: Rachel Clark  DOB: May 26, 1951 MRN: [id number]    DATE OF LAST VISIT: 04/13/23 DR. Swaziland DATE OF NEXT VISIT: 07/27/23 Marjie Skiff, [facility]  Request for Surgical Clearance    Procedure:   COLONOSCOPY/ENDOSCOPY  Date of Surgery:  Clearance 05/28/23                                 Surgeon:  DR. Bosie Clos Surgeon's Group or Practice Name:  [facility] Phone number:  [phone] Fax number:  [phone]   Type of Clearance Requested:   - Medical  - Pharmacy:  Hold Prasugrel (Effient)     Type of Anesthesia:   PROPOFOL   Additional requests/questions:    Elpidio Anis   05/09/2023, 12:40 PM

## 2023-05-12 ENCOUNTER — Other Ambulatory Visit: Payer: Self-pay | Admitting: Cardiology

## 2023-05-22 ENCOUNTER — Encounter (HOSPITAL_COMMUNITY): Payer: Medicare Other

## 2023-05-22 DIAGNOSIS — I495 Sick sinus syndrome: Secondary | ICD-10-CM

## 2023-05-23 LAB — CUP PACEART REMOTE DEVICE CHECK
Battery Remaining Longevity: 40 mo
Battery Voltage: 2.96 V
Brady Statistic AP VP Percent: 0.04 %
Brady Statistic AP VS Percent: 10.12 %
Brady Statistic AS VP Percent: 0.12 %
Brady Statistic AS VS Percent: 89.72 %
Brady Statistic RA Percent Paced: 9.72 %
Brady Statistic RV Percent Paced: 0.15 %
Date Time Interrogation Session: 20241119174438
Implantable Lead Connection Status: 753985
Implantable Lead Connection Status: 753985
Implantable Lead Implant Date: 20160922
Implantable Lead Implant Date: 20160922
Implantable Lead Location: 753859
Implantable Lead Location: 753860
Implantable Lead Model: 5076
Implantable Lead Model: 5076
Implantable Pulse Generator Implant Date: 20160922
Lead Channel Impedance Value: 1330 Ohm
Lead Channel Impedance Value: 1349 Ohm
Lead Channel Impedance Value: 380 Ohm
Lead Channel Impedance Value: 456 Ohm
Lead Channel Pacing Threshold Amplitude: 0.875 V
Lead Channel Pacing Threshold Amplitude: 1.25 V
Lead Channel Pacing Threshold Pulse Width: 0.4 ms
Lead Channel Pacing Threshold Pulse Width: 0.4 ms
Lead Channel Sensing Intrinsic Amplitude: 2.75 mV
Lead Channel Sensing Intrinsic Amplitude: 2.75 mV
Lead Channel Sensing Intrinsic Amplitude: 4 mV
Lead Channel Sensing Intrinsic Amplitude: 4 mV
Lead Channel Setting Pacing Amplitude: 2.5 V
Lead Channel Setting Pacing Amplitude: 2.5 V
Lead Channel Setting Pacing Pulse Width: 0.4 ms
Lead Channel Setting Sensing Sensitivity: 2.8 mV
Zone Setting Status: 755011
Zone Setting Status: 755011

## 2023-05-29 DIAGNOSIS — Z08 Encounter for follow-up examination after completed treatment for malignant neoplasm: Secondary | ICD-10-CM | POA: Diagnosis not present

## 2023-05-29 DIAGNOSIS — Z23 Encounter for immunization: Secondary | ICD-10-CM | POA: Diagnosis not present

## 2023-05-29 DIAGNOSIS — Z85828 Personal history of other malignant neoplasm of skin: Secondary | ICD-10-CM | POA: Diagnosis not present

## 2023-06-18 NOTE — Progress Notes (Signed)
Remote pacemaker transmission.   

## 2023-06-18 NOTE — Addendum Note (Signed)
Addended by: Geralyn Flash D on: 06/18/2023 03:21 PM   Modules accepted: Orders

## 2023-07-13 DIAGNOSIS — Z860100 Personal history of colon polyps, unspecified: Secondary | ICD-10-CM | POA: Diagnosis not present

## 2023-07-13 DIAGNOSIS — Z09 Encounter for follow-up examination after completed treatment for conditions other than malignant neoplasm: Secondary | ICD-10-CM | POA: Diagnosis not present

## 2023-07-13 DIAGNOSIS — D122 Benign neoplasm of ascending colon: Secondary | ICD-10-CM | POA: Diagnosis not present

## 2023-07-13 DIAGNOSIS — K648 Other hemorrhoids: Secondary | ICD-10-CM | POA: Diagnosis not present

## 2023-07-13 DIAGNOSIS — K5730 Diverticulosis of large intestine without perforation or abscess without bleeding: Secondary | ICD-10-CM | POA: Diagnosis not present

## 2023-07-13 DIAGNOSIS — D123 Benign neoplasm of transverse colon: Secondary | ICD-10-CM | POA: Diagnosis not present

## 2023-07-17 DIAGNOSIS — E559 Vitamin D deficiency, unspecified: Secondary | ICD-10-CM | POA: Diagnosis not present

## 2023-07-17 DIAGNOSIS — D123 Benign neoplasm of transverse colon: Secondary | ICD-10-CM | POA: Diagnosis not present

## 2023-07-17 DIAGNOSIS — I25119 Atherosclerotic heart disease of native coronary artery with unspecified angina pectoris: Secondary | ICD-10-CM | POA: Diagnosis not present

## 2023-07-17 DIAGNOSIS — D500 Iron deficiency anemia secondary to blood loss (chronic): Secondary | ICD-10-CM | POA: Diagnosis not present

## 2023-07-17 DIAGNOSIS — Z23 Encounter for immunization: Secondary | ICD-10-CM | POA: Diagnosis not present

## 2023-07-17 DIAGNOSIS — R7301 Impaired fasting glucose: Secondary | ICD-10-CM | POA: Diagnosis not present

## 2023-07-17 DIAGNOSIS — Z79899 Other long term (current) drug therapy: Secondary | ICD-10-CM | POA: Diagnosis not present

## 2023-07-17 DIAGNOSIS — I10 Essential (primary) hypertension: Secondary | ICD-10-CM | POA: Diagnosis not present

## 2023-07-17 DIAGNOSIS — G40909 Epilepsy, unspecified, not intractable, without status epilepticus: Secondary | ICD-10-CM | POA: Diagnosis not present

## 2023-07-17 DIAGNOSIS — F324 Major depressive disorder, single episode, in partial remission: Secondary | ICD-10-CM | POA: Diagnosis not present

## 2023-07-17 DIAGNOSIS — G720 Drug-induced myopathy: Secondary | ICD-10-CM | POA: Diagnosis not present

## 2023-07-17 DIAGNOSIS — D122 Benign neoplasm of ascending colon: Secondary | ICD-10-CM | POA: Diagnosis not present

## 2023-07-17 DIAGNOSIS — E039 Hypothyroidism, unspecified: Secondary | ICD-10-CM | POA: Diagnosis not present

## 2023-07-17 DIAGNOSIS — Z0000 Encounter for general adult medical examination without abnormal findings: Secondary | ICD-10-CM | POA: Diagnosis not present

## 2023-07-17 DIAGNOSIS — D472 Monoclonal gammopathy: Secondary | ICD-10-CM | POA: Diagnosis not present

## 2023-07-17 DIAGNOSIS — J4520 Mild intermittent asthma, uncomplicated: Secondary | ICD-10-CM | POA: Diagnosis not present

## 2023-07-17 DIAGNOSIS — Z7901 Long term (current) use of anticoagulants: Secondary | ICD-10-CM | POA: Diagnosis not present

## 2023-07-17 DIAGNOSIS — E785 Hyperlipidemia, unspecified: Secondary | ICD-10-CM | POA: Diagnosis not present

## 2023-07-18 DIAGNOSIS — F02A4 Dementia in other diseases classified elsewhere, mild, with anxiety: Secondary | ICD-10-CM | POA: Diagnosis not present

## 2023-07-25 ENCOUNTER — Encounter (HOSPITAL_COMMUNITY): Payer: Self-pay | Admitting: *Deleted

## 2023-07-25 ENCOUNTER — Inpatient Hospital Stay (HOSPITAL_COMMUNITY)
Admission: EM | Admit: 2023-07-25 | Discharge: 2023-07-28 | DRG: 919 | Disposition: A | Discharge status: Home / Self Care | Payer: Medicare Other | Attending: Internal Medicine | Admitting: Internal Medicine

## 2023-07-25 ENCOUNTER — Other Ambulatory Visit: Payer: Self-pay

## 2023-07-25 DIAGNOSIS — I252 Old myocardial infarction: Secondary | ICD-10-CM

## 2023-07-25 DIAGNOSIS — K644 Residual hemorrhoidal skin tags: Secondary | ICD-10-CM | POA: Diagnosis present

## 2023-07-25 DIAGNOSIS — K922 Gastrointestinal hemorrhage, unspecified: Secondary | ICD-10-CM | POA: Diagnosis present

## 2023-07-25 DIAGNOSIS — Z79899 Other long term (current) drug therapy: Secondary | ICD-10-CM

## 2023-07-25 DIAGNOSIS — G40409 Other generalized epilepsy and epileptic syndromes, not intractable, without status epilepticus: Secondary | ICD-10-CM | POA: Diagnosis present

## 2023-07-25 DIAGNOSIS — Z91018 Allergy to other foods: Secondary | ICD-10-CM

## 2023-07-25 DIAGNOSIS — K2101 Gastro-esophageal reflux disease with esophagitis, with bleeding: Secondary | ICD-10-CM | POA: Diagnosis present

## 2023-07-25 DIAGNOSIS — Z881 Allergy status to other antibiotic agents status: Secondary | ICD-10-CM

## 2023-07-25 DIAGNOSIS — K921 Melena: Principal | ICD-10-CM | POA: Diagnosis present

## 2023-07-25 DIAGNOSIS — Z7902 Long term (current) use of antithrombotics/antiplatelets: Secondary | ICD-10-CM

## 2023-07-25 DIAGNOSIS — K254 Chronic or unspecified gastric ulcer with hemorrhage: Secondary | ICD-10-CM | POA: Diagnosis present

## 2023-07-25 DIAGNOSIS — E785 Hyperlipidemia, unspecified: Secondary | ICD-10-CM | POA: Diagnosis present

## 2023-07-25 DIAGNOSIS — Z950 Presence of cardiac pacemaker: Secondary | ICD-10-CM | POA: Diagnosis not present

## 2023-07-25 DIAGNOSIS — Z981 Arthrodesis status: Secondary | ICD-10-CM

## 2023-07-25 DIAGNOSIS — Z79890 Hormone replacement therapy (postmenopausal): Secondary | ICD-10-CM

## 2023-07-25 DIAGNOSIS — M797 Fibromyalgia: Secondary | ICD-10-CM | POA: Diagnosis present

## 2023-07-25 DIAGNOSIS — Z880 Allergy status to penicillin: Secondary | ICD-10-CM

## 2023-07-25 DIAGNOSIS — E039 Hypothyroidism, unspecified: Secondary | ICD-10-CM | POA: Diagnosis present

## 2023-07-25 DIAGNOSIS — Z8249 Family history of ischemic heart disease and other diseases of the circulatory system: Secondary | ICD-10-CM | POA: Diagnosis not present

## 2023-07-25 DIAGNOSIS — Z882 Allergy status to sulfonamides status: Secondary | ICD-10-CM

## 2023-07-25 DIAGNOSIS — G8929 Other chronic pain: Secondary | ICD-10-CM | POA: Diagnosis present

## 2023-07-25 DIAGNOSIS — Z87891 Personal history of nicotine dependence: Secondary | ICD-10-CM

## 2023-07-25 DIAGNOSIS — Y838 Other surgical procedures as the cause of abnormal reaction of the patient, or of later complication, without mention of misadventure at the time of the procedure: Secondary | ICD-10-CM | POA: Diagnosis present

## 2023-07-25 DIAGNOSIS — K5521 Angiodysplasia of colon with hemorrhage: Secondary | ICD-10-CM | POA: Diagnosis present

## 2023-07-25 DIAGNOSIS — K5730 Diverticulosis of large intestine without perforation or abscess without bleeding: Secondary | ICD-10-CM | POA: Diagnosis not present

## 2023-07-25 DIAGNOSIS — I2510 Atherosclerotic heart disease of native coronary artery without angina pectoris: Secondary | ICD-10-CM | POA: Diagnosis present

## 2023-07-25 DIAGNOSIS — R4189 Other symptoms and signs involving cognitive functions and awareness: Secondary | ICD-10-CM | POA: Diagnosis present

## 2023-07-25 DIAGNOSIS — K5731 Diverticulosis of large intestine without perforation or abscess with bleeding: Secondary | ICD-10-CM | POA: Diagnosis present

## 2023-07-25 DIAGNOSIS — I10 Essential (primary) hypertension: Secondary | ICD-10-CM | POA: Diagnosis present

## 2023-07-25 DIAGNOSIS — D122 Benign neoplasm of ascending colon: Secondary | ICD-10-CM | POA: Diagnosis present

## 2023-07-25 DIAGNOSIS — K648 Other hemorrhoids: Secondary | ICD-10-CM | POA: Diagnosis present

## 2023-07-25 DIAGNOSIS — K633 Ulcer of intestine: Secondary | ICD-10-CM | POA: Diagnosis present

## 2023-07-25 DIAGNOSIS — K625 Hemorrhage of anus and rectum: Secondary | ICD-10-CM | POA: Diagnosis not present

## 2023-07-25 DIAGNOSIS — K5520 Angiodysplasia of colon without hemorrhage: Secondary | ICD-10-CM | POA: Diagnosis not present

## 2023-07-25 DIAGNOSIS — K635 Polyp of colon: Secondary | ICD-10-CM | POA: Diagnosis not present

## 2023-07-25 DIAGNOSIS — Z85038 Personal history of other malignant neoplasm of large intestine: Secondary | ICD-10-CM

## 2023-07-25 DIAGNOSIS — K91840 Postprocedural hemorrhage and hematoma of a digestive system organ or structure following a digestive system procedure: Secondary | ICD-10-CM | POA: Diagnosis not present

## 2023-07-25 DIAGNOSIS — D62 Acute posthemorrhagic anemia: Secondary | ICD-10-CM | POA: Diagnosis present

## 2023-07-25 DIAGNOSIS — D123 Benign neoplasm of transverse colon: Secondary | ICD-10-CM | POA: Diagnosis not present

## 2023-07-25 DIAGNOSIS — Z7982 Long term (current) use of aspirin: Secondary | ICD-10-CM

## 2023-07-25 DIAGNOSIS — Z90710 Acquired absence of both cervix and uterus: Secondary | ICD-10-CM

## 2023-07-25 DIAGNOSIS — E871 Hypo-osmolality and hyponatremia: Secondary | ICD-10-CM | POA: Diagnosis present

## 2023-07-25 DIAGNOSIS — Z955 Presence of coronary angioplasty implant and graft: Secondary | ICD-10-CM | POA: Diagnosis not present

## 2023-07-25 DIAGNOSIS — Z951 Presence of aortocoronary bypass graft: Secondary | ICD-10-CM

## 2023-07-25 LAB — CBC WITH DIFFERENTIAL/PLATELET
Abs Immature Granulocytes: 0.03 10*3/uL (ref 0.00–0.07)
Basophils Absolute: 0 10*3/uL (ref 0.0–0.1)
Basophils Relative: 0 %
Eosinophils Absolute: 0.2 10*3/uL (ref 0.0–0.5)
Eosinophils Relative: 3 %
HCT: 33.2 % — ABNORMAL LOW (ref 36.0–46.0)
Hemoglobin: 11.3 g/dL — ABNORMAL LOW (ref 12.0–15.0)
Immature Granulocytes: 1 %
Lymphocytes Relative: 21 %
Lymphs Abs: 1.4 10*3/uL (ref 0.7–4.0)
MCH: 30.5 pg (ref 26.0–34.0)
MCHC: 34 g/dL (ref 30.0–36.0)
MCV: 89.5 fL (ref 80.0–100.0)
Monocytes Absolute: 0.5 10*3/uL (ref 0.1–1.0)
Monocytes Relative: 7 %
Neutro Abs: 4.5 10*3/uL (ref 1.7–7.7)
Neutrophils Relative %: 68 %
Platelets: 394 10*3/uL (ref 150–400)
RBC: 3.71 MIL/uL — ABNORMAL LOW (ref 3.87–5.11)
RDW: 12.7 % (ref 11.5–15.5)
WBC: 6.6 10*3/uL (ref 4.0–10.5)
nRBC: 0 % (ref 0.0–0.2)

## 2023-07-25 LAB — COMPREHENSIVE METABOLIC PANEL
ALT: 11 U/L (ref 0–44)
AST: 15 U/L (ref 15–41)
Albumin: 3.1 g/dL — ABNORMAL LOW (ref 3.5–5.0)
Alkaline Phosphatase: 49 U/L (ref 38–126)
Anion gap: 8 (ref 5–15)
BUN: 19 mg/dL (ref 8–23)
CO2: 23 mmol/L (ref 22–32)
Calcium: 8.4 mg/dL — ABNORMAL LOW (ref 8.9–10.3)
Chloride: 101 mmol/L (ref 98–111)
Creatinine, Ser: 0.79 mg/dL (ref 0.44–1.00)
GFR, Estimated: >60 mL/min (ref >60)
Glucose, Bld: 131 mg/dL — ABNORMAL HIGH (ref 70–99)
Potassium: 4.1 mmol/L (ref 3.5–5.1)
Sodium: 132 mmol/L — ABNORMAL LOW (ref 135–145)
Total Bilirubin: 0.4 mg/dL (ref 0.0–1.2)
Total Protein: 6.4 g/dL — ABNORMAL LOW (ref 6.5–8.1)

## 2023-07-25 LAB — LIPASE, BLOOD: Lipase: 28 U/L (ref 11–51)

## 2023-07-25 LAB — SAMPLE TO BLOOD BANK

## 2023-07-25 LAB — POC OCCULT BLOOD, ED: Fecal Occult Bld: POSITIVE — AB

## 2023-07-25 NOTE — ED Provider Notes (Signed)
[place] EMERGENCY DEPARTMENT AT [facility] Provider Note   CSN: [id number] Arrival date & time: 07/25/23  1604     History {Add pertinent medical, surgical, social history, OB history to HPI:1} Chief Complaint  Patient presents with   Rectal Bleeding    Rachel Clark is a 73 y.o. female with PMHx colon cancer, OA, CAD, HLD, fibromyalgia, GERD, GI bleed, hypothyroidism who presents to ED concerned for large amount of BRBPR with BM today. This single episode happened around 3:30PM today. Patient without any rectal bleeding outside of this event. Family member at bedside stating that patient had a colonoscopy 2 weeks ago where some polyps were removed. Patient stopped taking her Prasugrel up until 2 days ago. Last blood thinner dose was earlier this morning.   Denies fever, chest pain, dyspnea, cough, nausea, vomiting, diarrhea, abdominal pain, dysuria, hematuria.    Rectal Bleeding      Home Medications Prior to Admission medications   Medication Sig Start Date End Date Taking? Authorizing Provider  acetaminophen (TYLENOL) 500 MG tablet Take 500-1,000 mg by mouth daily as needed for mild pain or headache.    [provider]  amLODipine (NORVASC) 5 MG tablet TAKE 1 TABLET (5 MG TOTAL) BY MOUTH DAILY. 05/14/23   Arty Baumgartner, NP  aspirin EC 81 MG tablet Take 1 tablet (81 mg total) by mouth daily. Swallow whole. 04/26/23   [place], Peter M, MD  Azelastine HCl 0.15 % SOLN Place 1 spray into both nostrils daily as needed (seasonal allergies). 12/22/14   [provider]  Bempedoic Acid-Ezetimibe (NEXLIZET) 180-10 MG TABS Take 180 mg by mouth daily. 04/25/22   [place], Peter M, MD  calcium carbonate (TUMS - DOSED IN MG ELEMENTAL CALCIUM) 500 MG chewable tablet Chew 500 mg by mouth daily.    [provider]  carbamazepine (TEGRETOL XR) 200 MG 12 hr tablet Take 3 tablets (600 mg total) by mouth 2 (two) times daily. 04/26/23 07/19/24  Glean Salvo, NP  Cholecalciferol (VITAMIN D) 50 MCG (2000 UT) CAPS Take 4,000 Units by mouth daily.    [provider]  diphenhydrAMINE (BENADRYL) 25 MG tablet Take 25 mg by mouth daily as needed for allergies.    [provider]  diphenhydramine-acetaminophen (TYLENOL PM) 25-500 MG TABS tablet Take 1 tablet by mouth at bedtime as needed (Sleep).    [provider]  DULoxetine (CYMBALTA) 60 MG capsule Take 60 mg by mouth 2 (two) times daily.    [provider]  ethosuximide (ZARONTIN) 250 MG capsule Take 2 capsules (500 mg total) by mouth 2 (two) times daily. TAKE 2 CAPSULES IN THE MORNING, TAKE 2 CAPSULES AT NIGHT 04/26/23   Glean Salvo, NP  Evolocumab (REPATHA SURECLICK) 140 MG/ML SOAJ Inject 140 mg into the skin every 14 (fourteen) days. 12/25/22   [place], Peter M, MD  ferrous sulfate 325 (65 FE) MG EC tablet Take 325 mg by mouth daily with breakfast.    [provider]  fluticasone (FLONASE) 50 MCG/ACT nasal spray Place 1 spray into both nostrils daily as needed for allergies.    [provider]  isosorbide mononitrate (IMDUR) 30 MG 24 hr tablet Take one-half tablet by mouth every day 04/17/23   [place], Peter M, MD  levothyroxine (SYNTHROID, LEVOTHROID) 50 MCG tablet Take 50 mcg by mouth daily before breakfast.    [provider]  memantine (NAMENDA) 10 MG tablet Take 1 tablet (10 mg total) by mouth 2 (two)  times daily. 04/26/23   Glean Salvo, NP  nitroGLYCERIN (NITROSTAT) 0.4 MG SL tablet place 1 tablet under the tongue every 5 minutes as needed for chest pain 02/16/23   [place], Peter M, MD  Oral Electrolytes (THERMOTABS PO) Take 452 mg by mouth daily. Thermotabs 452 mg sodium chloride    [provider]  pantoprazole (PROTONIX) 40 MG tablet TAKE 1 TABLET BY MOUTH TWICE A DAY 05/08/23   [place], Peter M, MD  prasugrel (EFFIENT) 10 MG TABS tablet Take 1 tablet (10 mg total) by mouth daily. 04/25/22   [place], Peter M, MD       Allergies    Sulfamethoxazole, Aspirin, Crestor [rosuvastatin], Erythromycin, Lisinopril, Niacin and related, Penicillin g benzathine, Statins, Sulfa drugs cross reactors, Tetracyclines & related, Xeloda [capecitabine], Penicillins, and Plavix [clopidogrel bisulfate]    Review of Systems   Review of Systems  Gastrointestinal:  Positive for hematochezia.    Physical Exam Updated Vital Signs BP (!) 122/55   Pulse (!) 39   Temp 98.1 F (36.7 C) (Oral)   Resp (!) 21   Ht 5\' 8"  (1.727 m)   Wt 77.1 kg   SpO2 100%   BMI 25.85 kg/m  Physical Exam Vitals and nursing note reviewed.  Constitutional:      General: She is not in acute distress.    Appearance: She is not ill-appearing or toxic-appearing.  HENT:     Head: Normocephalic and atraumatic.     Mouth/Throat:     Mouth: Mucous membranes are moist.  Eyes:     General: No scleral icterus.       Right eye: No discharge.        Left eye: No discharge.     Conjunctiva/sclera: Conjunctivae normal.  Cardiovascular:     Rate and Rhythm: Normal rate and regular rhythm.     Pulses: Normal pulses.     Heart sounds: Normal heart sounds. No murmur heard. Pulmonary:     Effort: Pulmonary effort is normal. No respiratory distress.     Breath sounds: Normal breath sounds. No wheezing, rhonchi or rales.  Abdominal:     General: Abdomen is flat. Bowel sounds are normal.     Palpations: Abdomen is soft.     Tenderness: There is no abdominal tenderness.  Genitourinary:    Comments: Chaperone Tantanaei, Paramedic Dark/maroon stool on DRE sample. No hemorrhagic bleeding appreciated.  Musculoskeletal:     Right lower leg: No edema.     Left lower leg: No edema.  Skin:    General: Skin is warm and dry.     Findings: No rash.  Neurological:     General: No focal deficit present.     Mental Status: She is alert and oriented to person, place, and time. Mental status is at baseline.  Psychiatric:        Mood and Affect: Mood normal.         Behavior: Behavior normal.     ED Results / Procedures / Treatments   Labs (all labs ordered are listed, but only abnormal results are displayed) Labs Reviewed  CBC WITH DIFFERENTIAL/PLATELET - Abnormal; Notable for the following components:      Result Value   RBC 3.71 (*)    Hemoglobin 11.3 (*)    HCT 33.2 (*)    All other components within normal limits  COMPREHENSIVE METABOLIC PANEL - Abnormal; Notable for the following components:   Sodium 132 (*)    Glucose, Bld 131 (*)  Calcium 8.4 (*)    Total Protein 6.4 (*)    Albumin 3.1 (*)    All other components within normal limits  LIPASE, BLOOD  SAMPLE TO BLOOD BANK    EKG None  Radiology No results found.  Procedures Procedures  {Document cardiac monitor, telemetry assessment procedure when appropriate:1}  Medications Ordered in ED Medications - No data to display  ED Course/ Medical Decision Making/ A&P   {   Click here for ABCD2, HEART and other calculatorsREFRESH Note before signing :1}                              Medical Decision Making  This patient presents to the ED for concern of hematochezia, this involves an extensive number of treatment options, and is a complaint that carries with it a high risk of complications and morbidity.  The differential diagnosis includes chron's disease, IBS, lower GI bleeding/hemorrhage, hemorrhoid, thrombosed hemorrhoid, anal fissure   Co morbidities that complicate the patient evaluation  colon cancer, OA, CAD, HLD, fibromyalgia, GERD, GI bleed, hypothyroidism   Additional history obtained:  Dr. Cliffton Asters PCP   Problem List / ED Course / Critical interventions / Medication management  Patient presents to ED concern for BRBPR.  Denies abdominal pain or any other symptoms today. DRE with maroon stool. Rest of physical exam unremarkable.  Patient afebrile with stable vitals. I Ordered, and personally interpreted labs.  CBC with mild anemia - hgb 11.3 which has  downtrended from last value of 14.4.  No leukocytosis.  Lipase within normal limits.  CMP reassuring.  Occult blood positive. I have reviewed the patients home medicines and have made adjustments as needed. I requested consultation with the GI on-call Dr. ,  and discussed lab and imaging findings as well as pertinent plan - they recommend: __    Social Determinants of Health:  none     {Document critical care time when appropriate:1} {Document review of labs and clinical decision tools ie heart score, Chads2Vasc2 etc:1}  {Document your independent review of radiology images, and any outside records:1} {Document your discussion with family members, caretakers, and with consultants:1} {Document social determinants of health affecting pt's care:1} {Document your decision making why or why not admission, treatments were needed:1} Final Clinical Impression(s) / ED Diagnoses Final diagnoses:  None    Rx / DC Orders ED Discharge Orders     None

## 2023-07-25 NOTE — ED Provider Notes (Incomplete)
[place] EMERGENCY DEPARTMENT AT [facility] Provider Note   CSN: [id number] Arrival date & time: 07/25/23  1604     History {Add pertinent medical, surgical, social history, OB history to HPI:1} Chief Complaint  Patient presents with  . Rectal Bleeding    KENNITA SCHLEIGER is a 73 y.o. female with PMHx colon cancer, OA, CAD, HLD, fibromyalgia, GERD, GI bleed, hypothyroidism who presents to ED concerned for large amount of BRBPR with BM today. This single episode happened around 3:30PM today. Patient without any rectal bleeding outside of this event. Family member at bedside stating that patient had a colonoscopy 2 weeks ago where some polyps were removed. Patient stopped taking her Prasugrel up until 2 days ago. Last blood thinner dose was earlier this morning.   Denies fever, chest pain, dyspnea, cough, nausea, vomiting, diarrhea, abdominal pain, dysuria, hematuria.    Rectal Bleeding      Home Medications Prior to Admission medications   Medication Sig Start Date End Date Taking? Authorizing Provider  acetaminophen (TYLENOL) 500 MG tablet Take 500-1,000 mg by mouth daily as needed for mild pain or headache.    [provider]  amLODipine (NORVASC) 5 MG tablet TAKE 1 TABLET (5 MG TOTAL) BY MOUTH DAILY. 05/14/23   Arty Baumgartner, NP  aspirin EC 81 MG tablet Take 1 tablet (81 mg total) by mouth daily. Swallow whole. 04/26/23   [place], Peter M, MD  Azelastine HCl 0.15 % SOLN Place 1 spray into both nostrils daily as needed (seasonal allergies). 12/22/14   [provider]  Bempedoic Acid-Ezetimibe (NEXLIZET) 180-10 MG TABS Take 180 mg by mouth daily. 04/25/22   [place], Peter M, MD  calcium carbonate (TUMS - DOSED IN MG ELEMENTAL CALCIUM) 500 MG chewable tablet Chew 500 mg by mouth daily.    [provider]  carbamazepine (TEGRETOL XR) 200 MG 12 hr tablet Take 3 tablets (600 mg total) by mouth 2 (two) times daily. 04/26/23 07/19/24  Glean Salvo, NP  Cholecalciferol (VITAMIN D) 50 MCG (2000 UT) CAPS Take 4,000 Units by mouth daily.    [provider]  diphenhydrAMINE (BENADRYL) 25 MG tablet Take 25 mg by mouth daily as needed for allergies.    [provider]  diphenhydramine-acetaminophen (TYLENOL PM) 25-500 MG TABS tablet Take 1 tablet by mouth at bedtime as needed (Sleep).    [provider]  DULoxetine (CYMBALTA) 60 MG capsule Take 60 mg by mouth 2 (two) times daily.    [provider]  ethosuximide (ZARONTIN) 250 MG capsule Take 2 capsules (500 mg total) by mouth 2 (two) times daily. TAKE 2 CAPSULES IN THE MORNING, TAKE 2 CAPSULES AT NIGHT 04/26/23   Glean Salvo, NP  Evolocumab (REPATHA SURECLICK) 140 MG/ML SOAJ Inject 140 mg into the skin every 14 (fourteen) days. 12/25/22   [place], Peter M, MD  ferrous sulfate 325 (65 FE) MG EC tablet Take 325 mg by mouth daily with breakfast.    [provider]  fluticasone (FLONASE) 50 MCG/ACT nasal spray Place 1 spray into both nostrils daily as needed for allergies.    [provider]  isosorbide mononitrate (IMDUR) 30 MG 24 hr tablet Take one-half tablet by mouth every day 04/17/23   [place], Peter M, MD  levothyroxine (SYNTHROID, LEVOTHROID) 50 MCG tablet Take 50 mcg by mouth daily before breakfast.    [provider]  memantine (NAMENDA) 10 MG tablet Take 1 tablet (10 mg total) by mouth 2 (two)  times daily. 04/26/23   Glean Salvo, NP  nitroGLYCERIN (NITROSTAT) 0.4 MG SL tablet place 1 tablet under the tongue every 5 minutes as needed for chest pain 02/16/23   [place], Peter M, MD  Oral Electrolytes (THERMOTABS PO) Take 452 mg by mouth daily. Thermotabs 452 mg sodium chloride    [provider]  pantoprazole (PROTONIX) 40 MG tablet TAKE 1 TABLET BY MOUTH TWICE A DAY 05/08/23   [place], Peter M, MD  prasugrel (EFFIENT) 10 MG TABS tablet Take 1 tablet (10 mg total) by mouth daily. 04/25/22   [place], Peter M, MD       Allergies    Sulfamethoxazole, Aspirin, Crestor [rosuvastatin], Erythromycin, Lisinopril, Niacin and related, Penicillin g benzathine, Statins, Sulfa drugs cross reactors, Tetracyclines & related, Xeloda [capecitabine], Penicillins, and Plavix [clopidogrel bisulfate]    Review of Systems   Review of Systems  Gastrointestinal:  Positive for hematochezia.    Physical Exam Updated Vital Signs BP (!) 122/55   Pulse (!) 39   Temp 98.1 F (36.7 C) (Oral)   Resp (!) 21   Ht 5\' 8"  (1.727 m)   Wt 77.1 kg   SpO2 100%   BMI 25.85 kg/m  Physical Exam Vitals and nursing note reviewed.  Constitutional:      General: She is not in acute distress.    Appearance: She is not ill-appearing or toxic-appearing.  HENT:     Head: Normocephalic and atraumatic.     Mouth/Throat:     Mouth: Mucous membranes are moist.  Eyes:     General: No scleral icterus.       Right eye: No discharge.        Left eye: No discharge.     Conjunctiva/sclera: Conjunctivae normal.  Cardiovascular:     Rate and Rhythm: Normal rate and regular rhythm.     Pulses: Normal pulses.     Heart sounds: Normal heart sounds. No murmur heard. Pulmonary:     Effort: Pulmonary effort is normal. No respiratory distress.     Breath sounds: Normal breath sounds. No wheezing, rhonchi or rales.  Abdominal:     General: Abdomen is flat. Bowel sounds are normal.     Palpations: Abdomen is soft.     Tenderness: There is no abdominal tenderness.  Genitourinary:    Comments: Chaperone Tantanaei, Paramedic Dark/maroon stool on DRE sample. No hemorrhagic bleeding appreciated.  Musculoskeletal:     Right lower leg: No edema.     Left lower leg: No edema.  Skin:    General: Skin is warm and dry.     Findings: No rash.  Neurological:     General: No focal deficit present.     Mental Status: She is alert and oriented to person, place, and time. Mental status is at baseline.  Psychiatric:        Mood and Affect: Mood normal.         Behavior: Behavior normal.     ED Results / Procedures / Treatments   Labs (all labs ordered are listed, but only abnormal results are displayed) Labs Reviewed  CBC WITH DIFFERENTIAL/PLATELET - Abnormal; Notable for the following components:      Result Value   RBC 3.71 (*)    Hemoglobin 11.3 (*)    HCT 33.2 (*)    All other components within normal limits  COMPREHENSIVE METABOLIC PANEL - Abnormal; Notable for the following components:   Sodium 132 (*)    Glucose, Bld 131 (*)  Calcium 8.4 (*)    Total Protein 6.4 (*)    Albumin 3.1 (*)    All other components within normal limits  LIPASE, BLOOD  SAMPLE TO BLOOD BANK    EKG None  Radiology No results found.  Procedures Procedures  {Document cardiac monitor, telemetry assessment procedure when appropriate:1}  Medications Ordered in ED Medications - No data to display  ED Course/ Medical Decision Making/ A&P   {   Click here for ABCD2, HEART and other calculatorsREFRESH Note before signing :1}                              Medical Decision Making  This patient presents to the ED for concern of hematochezia, this involves an extensive number of treatment options, and is a complaint that carries with it a high risk of complications and morbidity.  The differential diagnosis includes chron's disease, IBS, lower GI bleeding/hemorrhage, hemorrhoid, thrombosed hemorrhoid, anal fissure   Co morbidities that complicate the patient evaluation  colon cancer, OA, CAD, HLD, fibromyalgia, GERD, GI bleed, hypothyroidism   Additional history obtained:  Dr. Cliffton Asters PCP   Problem List / ED Course / Critical interventions / Medication management  Patient presents to ED concern for BRBPR.  Denies abdominal pain or any other symptoms today. DRE with maroon stool. Rest of physical exam unremarkable.  Patient afebrile with stable vitals. I Ordered, and personally interpreted labs.  CBC with mild anemia - hgb 11.3 which has  downtrended from last value of 14.4.  No leukocytosis.  Lipase within normal limits.  CMP reassuring.  Occult blood positive. I have reviewed the patients home medicines and have made adjustments as needed. I requested consultation with the GI on-call Dr. Lorenso Quarry,  and discussed lab and imaging findings as well as pertinent plan - they recommend: clear liquids diet and will see patient in the morning.    Social Determinants of Health:  none     {Document critical care time when appropriate:1} {Document review of labs and clinical decision tools ie heart score, Chads2Vasc2 etc:1}  {Document your independent review of radiology images, and any outside records:1} {Document your discussion with family members, caretakers, and with consultants:1} {Document social determinants of health affecting pt's care:1} {Document your decision making why or why not admission, treatments were needed:1} Final Clinical Impression(s) / ED Diagnoses Final diagnoses:  None    Rx / DC Orders ED Discharge Orders     None

## 2023-07-25 NOTE — ED Notes (Signed)
Brought back  to triage charge notified

## 2023-07-25 NOTE — ED Notes (Signed)
The pt went into the br in lobby had a toilet full of bright red blood

## 2023-07-25 NOTE — ED Provider Triage Note (Signed)
Emergency Medicine Provider Triage Evaluation Note  Rachel Clark , a 73 y.o. female  was evaluated in triage.  Pt complains of rectal bleed.  Review of Systems  Positive: Blood per rectum today Negative: vomiting  Physical Exam  BP 138/75 (BP Location: Right Arm)   Pulse 96   Temp 97.6 F (36.4 C)   Resp 16   Ht 5\' 8"  (1.727 m)   Wt 78.5 kg   SpO2 99%   BMI 26.31 kg/m  Gen:   Awake, no distress   Resp:  Normal effort  MSK:   Moves extremities without difficulty  Other:    Medical Decision Making  Medically screening exam initiated at 4:19 PM.  Appropriate orders placed.  [name] was informed that the remainder of the evaluation will be completed by another provider, this initial triage assessment does not replace that evaluation, and the importance of remaining in the ED until their evaluation is complete.  History of colon CA s/p 5 years ago in remission. Patient cannot provide details, feels she is forgetful. No vomiting. Today had some flatulence and passed blood, prompting ED evaluation.    Elpidio Anis, PA-C 07/25/23 1621

## 2023-07-25 NOTE — ED Triage Notes (Signed)
The pt had colon cancer 5 years ago today she suddenly had  bright red blood from her rectum today no pain at present

## 2023-07-26 DIAGNOSIS — D62 Acute posthemorrhagic anemia: Secondary | ICD-10-CM | POA: Diagnosis not present

## 2023-07-26 DIAGNOSIS — K922 Gastrointestinal hemorrhage, unspecified: Secondary | ICD-10-CM | POA: Diagnosis not present

## 2023-07-26 DIAGNOSIS — Z7902 Long term (current) use of antithrombotics/antiplatelets: Secondary | ICD-10-CM | POA: Diagnosis not present

## 2023-07-26 DIAGNOSIS — K625 Hemorrhage of anus and rectum: Secondary | ICD-10-CM | POA: Diagnosis not present

## 2023-07-26 DIAGNOSIS — K921 Melena: Principal | ICD-10-CM | POA: Diagnosis present

## 2023-07-26 LAB — CBC
HCT: 27.9 % — ABNORMAL LOW (ref 36.0–46.0)
Hemoglobin: 9.2 g/dL — ABNORMAL LOW (ref 12.0–15.0)
MCH: 30.2 pg (ref 26.0–34.0)
MCHC: 33 g/dL (ref 30.0–36.0)
MCV: 91.5 fL (ref 80.0–100.0)
Platelets: 341 10*3/uL (ref 150–400)
RBC: 3.05 MIL/uL — ABNORMAL LOW (ref 3.87–5.11)
RDW: 13 % (ref 11.5–15.5)
WBC: 9.5 10*3/uL (ref 4.0–10.5)
nRBC: 0 % (ref 0.0–0.2)

## 2023-07-26 LAB — HEMOGLOBIN AND HEMATOCRIT, BLOOD
HCT: 25.2 % — ABNORMAL LOW (ref 36.0–46.0)
HCT: 25.7 % — ABNORMAL LOW (ref 36.0–46.0)
Hemoglobin: 8.4 g/dL — ABNORMAL LOW (ref 12.0–15.0)
Hemoglobin: 8.8 g/dL — ABNORMAL LOW (ref 12.0–15.0)

## 2023-07-26 MED ORDER — CARBAMAZEPINE ER 200 MG PO TB12
600.0000 mg | ORAL_TABLET | Freq: Two times a day (BID) | ORAL | Status: DC
Start: 2023-07-26 — End: 2023-07-28
  Administered 2023-07-26 – 2023-07-27 (×5): 600 mg via ORAL
  Filled 2023-07-26 (×7): qty 3

## 2023-07-26 MED ORDER — ONDANSETRON HCL 4 MG/2ML IJ SOLN: 4.0000 mg | Freq: Four times a day (QID) | INTRAMUSCULAR | Status: DC | PRN

## 2023-07-26 MED ORDER — PANTOPRAZOLE SODIUM 40 MG PO TBEC
40.0000 mg | DELAYED_RELEASE_TABLET | Freq: Two times a day (BID) | ORAL | Status: DC
Start: 2023-07-26 — End: 2023-07-28
  Administered 2023-07-26 – 2023-07-27 (×4): 40 mg via ORAL
  Filled 2023-07-26 (×4): qty 1

## 2023-07-26 MED ORDER — DULOXETINE HCL 60 MG PO CPEP
60.0000 mg | ORAL_CAPSULE | Freq: Two times a day (BID) | ORAL | Status: DC
  Administered 2023-07-26 – 2023-07-27 (×4): 60 mg via ORAL
  Filled 2023-07-26: qty 1
  Filled 2023-07-26: qty 2
  Filled 2023-07-26 (×2): qty 1

## 2023-07-26 MED ORDER — MEMANTINE HCL 10 MG PO TABS
10.0000 mg | ORAL_TABLET | Freq: Two times a day (BID) | ORAL | Status: DC
Start: 2023-07-26 — End: 2023-07-28
  Administered 2023-07-26 – 2023-07-27 (×4): 10 mg via ORAL
  Filled 2023-07-26 (×4): qty 1

## 2023-07-26 MED ORDER — LEVOTHYROXINE SODIUM 50 MCG PO TABS
50.0000 ug | ORAL_TABLET | Freq: Every day | ORAL | Status: DC
  Administered 2023-07-26 – 2023-07-27 (×2): 50 ug via ORAL
  Filled 2023-07-26: qty 2
  Filled 2023-07-26: qty 1

## 2023-07-26 MED ORDER — ASPIRIN 81 MG PO TBEC: 81.0000 mg | DELAYED_RELEASE_TABLET | Freq: Every day | ORAL | Status: DC

## 2023-07-26 MED ORDER — ONDANSETRON HCL 4 MG PO TABS
4.0000 mg | ORAL_TABLET | Freq: Four times a day (QID) | ORAL | Status: DC | PRN
Start: 2023-07-26 — End: 2023-07-28

## 2023-07-26 MED ORDER — ACETAMINOPHEN 650 MG RE SUPP: 650.0000 mg | Freq: Four times a day (QID) | RECTAL | Status: DC | PRN

## 2023-07-26 MED ORDER — AMLODIPINE BESYLATE 5 MG PO TABS
5.0000 mg | ORAL_TABLET | Freq: Every day | ORAL | Status: DC
  Administered 2023-07-26 – 2023-07-27 (×2): 5 mg via ORAL
  Filled 2023-07-26 (×3): qty 1

## 2023-07-26 MED ORDER — ACETAMINOPHEN 325 MG PO TABS: 650.0000 mg | ORAL_TABLET | Freq: Four times a day (QID) | ORAL | Status: DC | PRN

## 2023-07-26 MED ORDER — ETHOSUXIMIDE 250 MG PO CAPS
500.0000 mg | ORAL_CAPSULE | Freq: Two times a day (BID) | ORAL | Status: DC
Start: 2023-07-26 — End: 2023-07-28
  Administered 2023-07-26 – 2023-07-27 (×5): 500 mg via ORAL
  Filled 2023-07-26 (×8): qty 2

## 2023-07-26 MED ORDER — ISOSORBIDE MONONITRATE ER 30 MG PO TB24
15.0000 mg | ORAL_TABLET | Freq: Every day | ORAL | Status: DC
  Administered 2023-07-26 – 2023-07-27 (×2): 15 mg via ORAL
  Filled 2023-07-26 (×3): qty 1

## 2023-07-26 NOTE — Progress Notes (Signed)
Same day note  Rachel Clark is a 73 y.o. female with medical history significant of colonic resection, CAD status post heart catheterization, seizure, hyperlipidemia, hypothyroidism presented to the emergency with bright red blood per rectum.  Of note patient did have a colonoscopy 2 weeks ago when multiple polyps were removed.  Patient was holding her aspirin and Plavix until 3 days ago when she started taking it and started having rectal bleed.  In the ED patient was noted to have a hemoglobin of 11.3 with mild hyponatremia at 132.  Patient was hemodynamically stable.  GI was consulted and the patient was admitted to hospital for further evaluation and treatment.  Patient seen and examined at bedside.  Patient was admitted to the hospital for bright red blood per rectum  At the time of my evaluation, patient complains of feeling okay since the morning.  Had large bloody bowel movements yesterday and 1 episode of bloody bowel movement this morning.  Physical examination reveals obese female not in obvious distress pallor noted.  No abdominal tenderness.   Laboratory data and imaging was reviewed  Assessment and Plan.  Hematochezia Acute blood loss anemia most likely secondary to post polypectomy bleed Colonoscopy with polypectomy 2 weeks back.  GI on board.  Hemoglobin had dropped from 14-11.3.  On clears. Will follow GI recommendations.   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  No Charge  Signed,  Tenny Craw, MD [facility]

## 2023-07-26 NOTE — Hospital Course (Signed)
Same day note  Rachel Clark is a 73 y.o. female with medical history significant of colonic resection, CAD status post heart catheterization, seizure, hyperlipidemia, hypothyroidism presented to the emergency with bright red blood per rectum.  Of note patient did have a colonoscopy 2 weeks ago when multiple polyps were removed.  Patient was holding her aspirin and Plavix until 3 days ago when she started taking it and started having rectal bleed.  In the ED patient was noted to have a hemoglobin of 11.3 with mild hyponatremia at 132.  Patient was hemodynamically stable.  GI was consulted and the patient was admitted to hospital for further evaluation and treatment.  Patient seen and examined at bedside.  Patient was admitted to the hospital for bright red blood per rectum  At the time of my evaluation, patient complains of  Physical examination reveals    Laboratory data and imaging was reviewed  Assessment and Plan.  Hematochezia Acute blood loss anemia most likely secondary to post polypectomy bleed Colonoscopy with polypectomy 2 weeks back.  GI on board.  Hemoglobin had dropped from 14-11.3.  On clears. Will follow GI recommendations.   History of CAD-will continue to hold aspirin, prasugrel continue Imdur   Hypothyroidism-continue levothyroxine   Cognitive impairment-continue Namenda   GERD-continue Protonix   Chronic pain-continue Cymbalta   Hypertension-continue amlodipine   History of grand mal seizure-continue Tegretol, ethosuximide    No Charge  Signed,  Tenny Craw, MD [facility]

## 2023-07-26 NOTE — ED Notes (Signed)
Patient ambulated to toilet in room; 1 episode of bloody stool noted; pt assisted back to bed - vitals assessed

## 2023-07-26 NOTE — Progress Notes (Signed)
Phlebotomy to grab labs.

## 2023-07-26 NOTE — Care Management Obs Status (Signed)
MEDICARE OBSERVATION STATUS NOTIFICATION   Patient Details  Name: ASHEENA SKOGLUND MRN: [id number] Date of Birth: [birth year]   Medicare Observation Status Notification Given:  Yes    Harriet Masson, RN 07/26/2023, 3:12 PM

## 2023-07-26 NOTE — H&P (Signed)
History and Physical    ANDA JARED Clark:[id number] DOB: September 18, 1950 DOA: 07/25/2023  PCP: Laurann Montana, MD   Chief Complaint: BRBPR  HPI: Rachel Clark is a 73 y.o. female with medical history significant of colonic resection, CAD status post heart catheterization, seizure, hyperlipidemia, hypothyroidism who presented to the emergency department due to to hematochezia.  Patient developed a large bloody bowel movement this afternoon.  She had a colonoscopy 2 weeks ago with multiple polyps removed.  She was holding her aspirin and prasugrel until 3 days ago and since resuming medication she developed rectal bleeding.  She presented to the ER where she was found to be afebrile hemodynamically stable.  Labs were obtained which demonstrated WBC 6.6, hemoglobin 11.3, sodium 132, potassium 4.1, lipase within normal limits.  Patient's gastroenterologist was consulted and will plan to see the patient.  They spoke with Dr. Lorenso Quarry from [facility] gastroenterology.  On evaluation patient has maroon-colored stool.  She is resting comfortably and otherwise hemodynamically stable without other acute complaints.   Review of Systems: Review of Systems  Constitutional:  Negative for chills and fever.  HENT: Negative.    Eyes: Negative.   Respiratory: Negative.    Cardiovascular: Negative.   Gastrointestinal: Negative.   Genitourinary: Negative.   Musculoskeletal: Negative.   Skin: Negative.   Neurological: Negative.   Endo/Heme/Allergies: Negative.   Psychiatric/Behavioral: Negative.    All other systems reviewed and are negative.    As per HPI otherwise 10 point review of systems negative.   Allergies  Allergen Reactions   Sulfamethoxazole Anaphylaxis    Don't recall   Aspirin Other (See Comments)    GI upset- can tolerate 81 mg ASA, just not full doses   Crestor [Rosuvastatin]     myalgias   Erythromycin Nausea Only    Gi upset   Lisinopril Cough   Niacin And Related Other (See  Comments)    Don't recall   Penicillin G Benzathine     Other reaction(s): Unknown   Statins Other (See Comments)    Muscle pain   Sulfa Drugs Cross Reactors Swelling    Don't recall   Tetracyclines & Related Nausea Only   Xeloda [Capecitabine] Diarrhea   Penicillins Nausea Only and Rash        Plavix [Clopidogrel Bisulfate] Rash    Past Medical History:  Diagnosis Date   Arthritis    "fingers" (06/29/2016)   Chronic lower back pain    Colon cancer (HCC) 12/04/2011   s/p Laparoscopic-assisted transverse colectomy on 12/19/2011 by Dr. Dwain Sarna.  pT3 N0 M0.    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   last seizure was in 1972 ;takes Tegretol (06/29/2016)    Headache    "weekly" (06/29/2016)   Hyperlipidemia    Hyponatremia    Hypothyroid    Iron deficiency anemia 11/17/2011   Memory change 11/13/2016   Monoclonal gammopathy of unknown significance    Orthostatic hypotension    Presence of permanent cardiac pacemaker    Syncope and collapse    pacemaker implanted    Past Surgical History:  Procedure Laterality Date   ANTERIOR CERVICAL DECOMP/DISCECTOMY FUSION  1980's   BACK SURGERY     CARDIAC CATHETERIZATION  05/20/2009   obstructive native vessel  disease in LAD, RCA, and first diagonal, patent vein graft to distal RCA and LIMA to LAD,normal. ef 60%   CARDIAC CATHETERIZATION N/A 03/24/2015   Procedure: Left Heart Cath and Cors/Grafts Angiography;  Surgeon: Iran Ouch, MD;  Location: [facility] INVASIVE CV LAB;  Service: Cardiovascular;  Laterality: N/A;   CARDIAC CATHETERIZATION N/A 07/28/2015   Procedure: Left Heart Cath and Coronary Angiography;  Surgeon: Lennette Bihari, MD;  Location: [facility] INVASIVE CV LAB;  Service: Cardiovascular;  Laterality: N/A;   CARDIAC CATHETERIZATION N/A  11/09/2015   Procedure: Coronary Stent Intervention;  Surgeon: Tonny Bollman, MD;  Location: [facility] INVASIVE CV LAB;  Service: Cardiovascular;  Laterality: N/A;   CARDIAC CATHETERIZATION N/A 11/09/2015   Procedure: Left Heart Cath and Coronary Angiography;  Surgeon: Tonny Bollman, MD;  Location: [facility] INVASIVE CV LAB;  Service: Cardiovascular;  Laterality: N/A;   CARDIAC CATHETERIZATION N/A 06/29/2016   Procedure: Left Heart Cath and Coronary Angiography;  Surgeon: Yvonne Kendall, MD;  Location: [facility] INVASIVE CV LAB;  Service: Cardiovascular;  Laterality: N/A;   CARDIAC CATHETERIZATION N/A 06/29/2016   Procedure: Intravascular Pressure Wire/FFR Study;  Surgeon: Yvonne Kendall, MD;  Location: [facility] INVASIVE CV LAB;  Service: Cardiovascular;  Laterality: N/A;   CARDIAC CATHETERIZATION N/A 06/29/2016   Procedure: Coronary Balloon Angioplasty;  Surgeon: Yvonne Kendall, MD;  Location: [facility] INVASIVE CV LAB;  Service: Cardiovascular;  Laterality: N/A;   COLON RESECTION  12/19/2011   Procedure: COLON RESECTION LAPAROSCOPIC;  Surgeon: Emelia Loron, MD;  Location: [facility] OR;  Service: General;  Laterality: N/A;  laparoscopic hand assisted partial colon resection   COLON SURGERY     COLONOSCOPY     CORONARY ANGIOPLASTY WITH STENT PLACEMENT  ~ 2007   1 stent   CORONARY ARTERY BYPASS GRAFT  2007   "CABG X2"   DILATION AND CURETTAGE OF UTERUS  1973   EP IMPLANTABLE DEVICE N/A 03/25/2015   MDT Advisa DR pacemaker implanted by Dr Johney Frame for transient complete heart block and syncope   ESOPHAGOGASTRODUODENOSCOPY (EGD) WITH PROPOFOL N/A 03/14/2021   Procedure: ESOPHAGOGASTRODUODENOSCOPY (EGD) WITH PROPOFOL;  Surgeon: Kerin Salen, MD;  Location: [facility] ENDOSCOPY;  Service: Gastroenterology;  Laterality: N/A;   ESOPHAGOGASTRODUODENOSCOPY (EGD) WITH PROPOFOL N/A 11/13/2021   Procedure: ESOPHAGOGASTRODUODENOSCOPY (EGD) WITH PROPOFOL;  Surgeon: Kathi Der, MD;  Location: [facility] ENDOSCOPY;  Service: Gastroenterology;  Laterality:  N/A;   ESOPHAGOGASTRODUODENOSCOPY (EGD) WITH PROPOFOL N/A 02/07/2023   Procedure: ESOPHAGOGASTRODUODENOSCOPY (EGD) WITH PROPOFOL;  Surgeon: Kathi Der, MD;  Location: [facility] ENDOSCOPY;  Service: Gastroenterology;  Laterality: N/A;   HEMOSTASIS CLIP PLACEMENT  03/14/2021   Procedure: HEMOSTASIS CLIP PLACEMENT;  Surgeon: Kerin Salen, MD;  Location: [facility] ENDOSCOPY;  Service: Gastroenterology;;   INSERT / REPLACE / REMOVE PACEMAKER     LEFT HEART CATH AND CORONARY ANGIOGRAPHY N/A 11/11/2021   Procedure: LEFT HEART CATH AND CORONARY ANGIOGRAPHY;  Surgeon: Marykay Lex, MD;  Location: [facility] INVASIVE CV LAB;  Service: Cardiovascular;  Laterality: N/A;   LEFT HEART CATH AND CORS/GRAFTS ANGIOGRAPHY N/A 02/21/2018   Procedure: LEFT HEART CATH AND CORS/GRAFTS ANGIOGRAPHY;  Surgeon: Runell Gess, MD;  Location: [facility] INVASIVE CV LAB;  Service: Cardiovascular;  Laterality: N/A;   LEFT HEART CATH AND CORS/GRAFTS ANGIOGRAPHY N/A 03/08/2021   Procedure: LEFT HEART CATH AND CORS/GRAFTS ANGIOGRAPHY;  Surgeon: [place], Peter M, MD;  Location: [facility] INVASIVE CV LAB;  Service: Cardiovascular;  Laterality: N/A;   PORT-A-CATH REMOVAL N/A 06/12/2013   Procedure: REMOVAL PORT-A-CATH;  Surgeon: Emelia Loron, MD;  Location: [facility];  Service: General;  Laterality: N/A;   PORTACATH PLACEMENT  06/04/2012   Procedure: INSERTION PORT-A-CATH;  Surgeon: Emelia Loron, MD;  Location: [facility] OR;  Service: General;  Laterality: N/A;  Insertion of port-a-cath    POSTERIOR LAMINECTOMY / DECOMPRESSION CERVICAL SPINE  1990s   SCLEROTHERAPY  03/14/2021   Procedure: SCLEROTHERAPY;  Surgeon: Kerin Salen, MD;  Location: [facility] ENDOSCOPY;  Service: Gastroenterology;;   VAGINAL HYSTERECTOMY       reports that she quit smoking about 42 years ago. Her smoking use included cigarettes. She started smoking about 62 years ago. She has a 6 pack-year smoking history. She has never used smokeless tobacco. She reports current alcohol use  of about 1.0 standard drink of alcohol per week. She reports that she does not use drugs.  Family History  Problem Relation Age of Onset   Heart attack Father    Heart disease Father    Heart attack Brother 80   Cancer Mother        lung   Stroke Neg Hx     Prior to Admission medications   Medication Sig Start Date End Date Taking? Authorizing Provider  acetaminophen (TYLENOL) 500 MG tablet Take 500-1,000 mg by mouth daily as needed for mild pain or headache.    [provider]  amLODipine (NORVASC) 5 MG tablet TAKE 1 TABLET (5 MG TOTAL) BY MOUTH DAILY. 05/14/23   Arty Baumgartner, NP  aspirin EC 81 MG tablet Take 1 tablet (81 mg total) by mouth daily. Swallow whole. 04/26/23   [place], Peter M, MD  Azelastine HCl 0.15 % SOLN Place 1 spray into both nostrils daily as needed (seasonal allergies). 12/22/14   [provider]  Bempedoic Acid-Ezetimibe (NEXLIZET) 180-10 MG TABS Take 180 mg by mouth daily. 04/25/22   [place], Peter M, MD  calcium carbonate (TUMS - DOSED IN MG ELEMENTAL CALCIUM) 500 MG chewable tablet Chew 500 mg by mouth daily.    [provider]  carbamazepine (TEGRETOL XR) 200 MG 12 hr tablet Take 3 tablets (600 mg total) by mouth 2 (two) times daily. 04/26/23 07/19/24  Glean Salvo, NP  Cholecalciferol (VITAMIN D) 50 MCG (2000 UT) CAPS Take 4,000 Units by mouth daily.    [provider]  diphenhydrAMINE (BENADRYL) 25 MG tablet Take 25 mg by mouth daily as needed for allergies.    [provider]  diphenhydramine-acetaminophen (TYLENOL PM) 25-500 MG TABS tablet Take 1 tablet by mouth at bedtime as needed (Sleep).    [provider]  DULoxetine (CYMBALTA) 60 MG capsule Take 60 mg by mouth 2 (two) times daily.    [provider]  ethosuximide (ZARONTIN) 250 MG capsule Take 2 capsules (500 mg total) by mouth 2 (two) times daily. TAKE 2 CAPSULES IN THE MORNING, TAKE 2 CAPSULES AT NIGHT 04/26/23   Glean Salvo, NP   Evolocumab (REPATHA SURECLICK) 140 MG/ML SOAJ Inject 140 mg into the skin every 14 (fourteen) days. 12/25/22   [place], Peter M, MD  ferrous sulfate 325 (65 FE) MG EC tablet Take 325 mg by mouth daily with breakfast.    [provider]  fluticasone (FLONASE) 50 MCG/ACT nasal spray Place 1 spray into both nostrils daily as needed for allergies.    [provider]  isosorbide mononitrate (IMDUR) 30 MG 24 hr tablet Take one-half tablet by mouth every day 04/17/23   [place], Peter M, MD  levothyroxine (SYNTHROID, LEVOTHROID) 50 MCG tablet Take 50 mcg by mouth daily before  breakfast.    [provider]  memantine (NAMENDA) 10 MG tablet Take 1 tablet (10 mg total) by mouth 2 (two) times daily. 04/26/23   Glean Salvo, NP  nitroGLYCERIN (NITROSTAT) 0.4 MG SL tablet place 1 tablet under the tongue every 5 minutes as needed for chest pain 02/16/23   [place], Peter M, MD  Oral Electrolytes (THERMOTABS PO) Take 452 mg by mouth daily. Thermotabs 452 mg sodium chloride    [provider]  pantoprazole (PROTONIX) 40 MG tablet TAKE 1 TABLET BY MOUTH TWICE A DAY 05/08/23   [place], Peter M, MD  prasugrel (EFFIENT) 10 MG TABS tablet Take 1 tablet (10 mg total) by mouth daily. 04/25/22   [place], Peter M, MD    Physical Exam: Vitals:   07/25/23 2142 07/25/23 2144 07/25/23 2148 07/25/23 2156  BP: 108/60 124/67 (!) 78/60 110/68  Pulse:  73  72  Resp:  17    Temp:      TempSrc:      SpO2: 100% 100%  99%  Weight:      Height:       Physical Exam Vitals reviewed.  Constitutional:      Appearance: She is normal weight.  HENT:     Head: Normocephalic.     Nose: Nose normal.     Mouth/Throat:     Mouth: Mucous membranes are moist.     Pharynx: Oropharynx is clear.  Eyes:     Pupils: Pupils are equal, round, and reactive to light.  Cardiovascular:     Rate and Rhythm: Regular rhythm.     Heart sounds: Normal heart sounds.  Pulmonary:     Effort: Pulmonary effort is  normal.  Abdominal:     General: Abdomen is flat. Bowel sounds are normal.  Musculoskeletal:        General: Normal range of motion.     Cervical back: Normal range of motion.  Skin:    General: Skin is warm.     Capillary Refill: Capillary refill takes less than 2 seconds.  Neurological:     General: No focal deficit present.     Mental Status: She is alert.  Psychiatric:        Mood and Affect: Mood normal.        Labs on Admission: I have personally reviewed the patients's labs and imaging studies.  Assessment/Plan Principal Problem:   Hematochezia   # Acute blood loss anemia most likely secondary to post polypectomy bleed - Patient underwent colonoscopy last 2 weeks with results not available in the chart however reports numerous polyps removed - Gastroenterologist was consulted - Hemoglobin dropped from baseline around 14-11.3 - 1 isolated bloody bowel movement  Plan: Check hemoglobin in morning Placed on clear liquid diet Appreciate GI recommendations  # CAD-hold aspirin, prasugrel.  Will plan to continue Imdur  # Hypothyroidism-continue levothyroxine  # Cognitive impairment-continue Namenda  # GERD-continue Protonix  # Chronic pain-continue Cymbalta  # Hypertension-continue amlodipine  # History of grand mal seizure-continue Tegretol, ethosuximide   Admission status: Observation Med-Surg  Certification: The appropriate patient status for this patient is OBSERVATION. Observation status is judged to be reasonable and necessary in order to provide the required intensity of service to ensure the patient's safety. The patient's presenting symptoms, physical exam findings, and initial radiographic and laboratory data in the context of their medical condition is felt to place them at decreased risk for further clinical deterioration. Furthermore, it is anticipated that the patient will  be medically stable for discharge from the hospital within 2 midnights of  admission.     Alan Mulder MD [facility] If 7PM-7AM, please contact night-coverage [email or web address]  07/26/2023, 12:17 AM

## 2023-07-26 NOTE — Consult Note (Signed)
Referring Provider: TH Primary Care Physician:  Laurann Montana, MD Primary Gastroenterologist: [facility]  Reason for Consultation: Anemia, chest pain,  HPI: Rachel Clark is a 73 y.o. female with past medical history of coronary disease currently on aspirin and Effient, with last PCI in December 2023 presented to the hospital with rectal bleeding.  She underwent surveillance colonoscopy with Dr. Bosie Clos in July 23, 2023.  She was found to have large 25 mm polyp in the ascending colon which was removed in a piecemeal fashion with hot snare.  3 clips were placed.  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   She was also found to have another 12 mm polyp in the ascending colon as well as 14 mm polyp at hepatic flexure.  Pathology showed tubular adenomas.  She started taking aspirin and Effient 3 days ago.  Started noticing fatigue around 3 days ago.  Noticed large amount of bright red blood per rectum yesterday afternoon.  Denies any associated abdominal pain, nausea or vomiting.  Blood work showed drop in hemoglobin to 9.2.  Her hemoglobin was normal in October 2024.  Previous GI workup  history of gastric ulcer requiring endoscopic treatment in 2022, EGD in August 2024 showed no evidence of ulcer disease.  Did showed LA grade B esophagitis and some gastric erosions.  Last EGD in May 2023 showed small clean-based prepyloric gastric ulcer without have any evidence of active bleeding and gastritis.  Patient with history of colon cancer status post transverse colectomy in 2013, last colonoscopy in 2019 showed multiple tubular adenomas as well as sessile serrated adenoma and repeat was recommended in 3 years.    Past Medical History:  Diagnosis Date   Arthritis    "fingers" (06/29/2016)   Chronic lower back pain    Colon cancer (HCC) 12/04/2011   s/p Laparoscopic-assisted transverse colectomy on 12/19/2011 by Dr. Dwain Sarna.  pT3 N0 M0.    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   last seizure was in 1972 ;takes Tegretol (06/29/2016)    Headache    "weekly" (06/29/2016)   Hyperlipidemia    Hyponatremia    Hypothyroid    Iron deficiency anemia 11/17/2011   Memory change 11/13/2016   Monoclonal gammopathy of unknown significance    Orthostatic hypotension    Presence of permanent cardiac pacemaker    Syncope and collapse    pacemaker implanted    Past Surgical History:  Procedure Laterality Date   ANTERIOR CERVICAL DECOMP/DISCECTOMY FUSION  1980's   BACK SURGERY     CARDIAC CATHETERIZATION  05/20/2009   obstructive native vessel disease in LAD, RCA, and first diagonal, patent vein graft to distal RCA and LIMA to LAD,normal. ef 60%   CARDIAC CATHETERIZATION N/A 03/24/2015   Procedure: Left Heart Cath and Cors/Grafts Angiography;  Surgeon: Iran Ouch, MD;  Location: [facility] INVASIVE CV LAB;  Service: Cardiovascular;  Laterality: N/A;   CARDIAC CATHETERIZATION N/A 07/28/2015   Procedure: Left Heart Cath and Coronary Angiography;  Surgeon: Lennette Bihari, MD;  Location: [facility] CV LAB;  Service: Cardiovascular;  Laterality: N/A;   CARDIAC CATHETERIZATION N/A 11/09/2015   Procedure: Coronary Stent Intervention;  Surgeon: Tonny Bollman, MD;  Location: [facility] INVASIVE CV LAB;  Service: Cardiovascular;  Laterality: N/A;  CARDIAC CATHETERIZATION N/A 11/09/2015   Procedure: Left Heart Cath and Coronary Angiography;  Surgeon: Tonny Bollman, MD;  Location: [facility] INVASIVE CV LAB;  Service: Cardiovascular;  Laterality: N/A;   CARDIAC CATHETERIZATION N/A 06/29/2016   Procedure: Left Heart Cath and Coronary Angiography;  Surgeon: Yvonne Kendall, MD;  Location: [facility] INVASIVE CV LAB;  Service: Cardiovascular;  Laterality: N/A;   CARDIAC CATHETERIZATION N/A 06/29/2016    Procedure: Intravascular Pressure Wire/FFR Study;  Surgeon: Yvonne Kendall, MD;  Location: [facility] INVASIVE CV LAB;  Service: Cardiovascular;  Laterality: N/A;   CARDIAC CATHETERIZATION N/A 06/29/2016   Procedure: Coronary Balloon Angioplasty;  Surgeon: Yvonne Kendall, MD;  Location: [facility] CV LAB;  Service: Cardiovascular;  Laterality: N/A;   COLON RESECTION  12/19/2011   Procedure: COLON RESECTION LAPAROSCOPIC;  Surgeon: Emelia Loron, MD;  Location: [facility] OR;  Service: General;  Laterality: N/A;  laparoscopic hand assisted partial colon resection   COLON SURGERY     COLONOSCOPY     CORONARY ANGIOPLASTY WITH STENT PLACEMENT  ~ 2007   1 stent   CORONARY ARTERY BYPASS GRAFT  2007   "CABG X2"   DILATION AND CURETTAGE OF UTERUS  1973   EP IMPLANTABLE DEVICE N/A 03/25/2015   MDT Advisa DR pacemaker implanted by Dr Johney Frame for transient complete heart block and syncope   ESOPHAGOGASTRODUODENOSCOPY (EGD) WITH PROPOFOL N/A 03/14/2021   Procedure: ESOPHAGOGASTRODUODENOSCOPY (EGD) WITH PROPOFOL;  Surgeon: Kerin Salen, MD;  Location: [facility] ENDOSCOPY;  Service: Gastroenterology;  Laterality: N/A;   ESOPHAGOGASTRODUODENOSCOPY (EGD) WITH PROPOFOL N/A 11/13/2021   Procedure: ESOPHAGOGASTRODUODENOSCOPY (EGD) WITH PROPOFOL;  Surgeon: Kathi Der, MD;  Location: [facility] ENDOSCOPY;  Service: Gastroenterology;  Laterality: N/A;   ESOPHAGOGASTRODUODENOSCOPY (EGD) WITH PROPOFOL N/A 02/07/2023   Procedure: ESOPHAGOGASTRODUODENOSCOPY (EGD) WITH PROPOFOL;  Surgeon: Kathi Der, MD;  Location: [facility] ENDOSCOPY;  Service: Gastroenterology;  Laterality: N/A;   HEMOSTASIS CLIP PLACEMENT  03/14/2021   Procedure: HEMOSTASIS CLIP PLACEMENT;  Surgeon: Kerin Salen, MD;  Location: [facility] ENDOSCOPY;  Service: Gastroenterology;;   INSERT / REPLACE / REMOVE PACEMAKER     LEFT HEART CATH AND CORONARY ANGIOGRAPHY N/A 11/11/2021   Procedure: LEFT HEART CATH AND CORONARY ANGIOGRAPHY;  Surgeon: Marykay Lex, MD;  Location: [facility] INVASIVE CV  LAB;  Service: Cardiovascular;  Laterality: N/A;   LEFT HEART CATH AND CORS/GRAFTS ANGIOGRAPHY N/A 02/21/2018   Procedure: LEFT HEART CATH AND CORS/GRAFTS ANGIOGRAPHY;  Surgeon: Runell Gess, MD;  Location: [facility] INVASIVE CV LAB;  Service: Cardiovascular;  Laterality: N/A;   LEFT HEART CATH AND CORS/GRAFTS ANGIOGRAPHY N/A 03/08/2021   Procedure: LEFT HEART CATH AND CORS/GRAFTS ANGIOGRAPHY;  Surgeon: Swaziland, Peter M, MD;  Location: [facility] INVASIVE CV LAB;  Service: Cardiovascular;  Laterality: N/A;   PORT-A-CATH REMOVAL N/A 06/12/2013   Procedure: REMOVAL PORT-A-CATH;  Surgeon: Emelia Loron, MD;  Location: [facility];  Service: General;  Laterality: N/A;   PORTACATH PLACEMENT  06/04/2012   Procedure: INSERTION PORT-A-CATH;  Surgeon: Emelia Loron, MD;  Location: [facility] OR;  Service: General;  Laterality: N/A;  Insertion of port-a-cath    POSTERIOR LAMINECTOMY / DECOMPRESSION CERVICAL SPINE  1990s   SCLEROTHERAPY  03/14/2021   Procedure: SCLEROTHERAPY;  Surgeon: Kerin Salen, MD;  Location: [facility] ENDOSCOPY;  Service: Gastroenterology;;   VAGINAL HYSTERECTOMY      Prior to Admission medications   Medication Sig Start Date End Date Taking? Authorizing Provider  acetaminophen (TYLENOL) 500 MG tablet Take 500-1,000 mg by mouth daily as needed for mild pain or headache.   Yes [provider]  aspirin 81 MG chewable tablet Chew 1 tablet (81 mg total) by mouth daily. 06/30/16  Yes Little Ishikawa, NP  Azelastine HCl 0.15 % SOLN Place 1 spray into both nostrils daily as needed (seasonal allergies). 12/22/14  Yes [provider]  Bempedoic Acid-Ezetimibe (NEXLIZET) 180-10 MG TABS Take 180 mg by mouth daily. 04/25/22  Yes [place], Peter M, MD  calcium carbonate (TUMS - DOSED IN MG ELEMENTAL CALCIUM) 500 MG chewable tablet Chew 500 mg by mouth daily.   Yes [provider]  carbamazepine (TEGRETOL XR) 200 MG 12 hr tablet Take 3 tablets (600 mg total) by mouth 2 (two) times  daily. 09/19/22 12/13/23 Yes Glean Salvo, NP  Cholecalciferol (VITAMIN D) 50 MCG (2000 UT) CAPS Take 4,000 Units by mouth daily.   Yes [provider]  diphenhydrAMINE (BENADRYL) 25 MG tablet Take 25 mg by mouth daily as needed for allergies.   Yes [provider]  diphenhydramine-acetaminophen (TYLENOL PM) 25-500 MG TABS tablet Take 1 tablet by mouth at bedtime as needed (Sleep).   Yes [provider]  DULoxetine (CYMBALTA) 60 MG capsule Take 60 mg by mouth 2 (two) times daily.   Yes [provider]  ethosuximide (ZARONTIN) 250 MG capsule TAKE 2 CAPSULES IN THE MORNING, TAKE 2 CAPSULES AT NIGHT Patient taking differently: Take 500 mg by mouth See admin instructions. Take 2 capsules in the morning and then take 2 capsules by mouth at night 07/11/22  Yes Glean Salvo, NP  Evolocumab (REPATHA SURECLICK) 140 MG/ML SOAJ Inject 140 mg into the skin every 14 (fourteen) days. 12/25/22  Yes [place], Peter M, MD  ferrous sulfate 325 (65 FE) MG EC tablet Take 325 mg by mouth daily with breakfast.   Yes [provider]  fluticasone (FLONASE) 50 MCG/ACT nasal spray Place 1 spray into both nostrils daily as needed for allergies.   Yes [provider]  isosorbide mononitrate (IMDUR) 30 MG 24 hr tablet TAKE HALF TABLET (15MG  TOTAL) BY MOUTH EVERY DAY Patient taking differently: Take 15 mg by mouth daily. 04/20/22  Yes [place], Peter M, MD  levothyroxine (SYNTHROID, LEVOTHROID) 50 MCG tablet Take 50 mcg by mouth daily before breakfast.   Yes [provider]  memantine (NAMENDA) 10 MG tablet Take 1 tablet (10 mg total) by mouth 2 (two) times daily. 09/14/22  Yes Glean Salvo, NP  nitroGLYCERIN (NITROSTAT) 0.4 MG SL tablet place 1 tablet under the tongue every 5 minutes as needed for chest pain Patient taking differently: Place 0.4 mg under the tongue every 5 (five) minutes as needed for chest pain. 01/24/23  Yes [place], Peter M, MD  Oral Electrolytes  (THERMOTABS PO) Take 452 mg by mouth daily. Thermotabs 452 mg sodium chloride   Yes [provider]  pantoprazole (PROTONIX) 40 MG tablet TAKE ONE TABLET BY MOUTH TWICE DAILY FOR 30 DAYS THEN TAKE ONE TABLET BY MOUTH DAILY Patient taking differently: Take 40 mg by mouth daily. 02/08/22  Yes [place], Peter M, MD  prasugrel (EFFIENT) 10 MG TABS tablet Take 1 tablet (10 mg total) by mouth daily. 04/25/22  Yes [place], Peter M, MD  ranolazine (RANEXA) 500 MG 12 hr tablet Take 1 tablet (500 mg total) by mouth 2 (two) times daily. 02/02/23  Yes Duke Salvia, MD    Scheduled Meds:  amLODipine  5 mg Oral Daily   [START ON 07/27/2023] aspirin EC  81 mg Oral Daily   carbamazepine  600 mg Oral  BID   DULoxetine  60 mg Oral BID   ethosuximide  500 mg Oral BID   isosorbide mononitrate  15 mg Oral Daily   levothyroxine  50 mcg Oral QAC breakfast   memantine  10 mg Oral BID   pantoprazole  40 mg Oral BID   Continuous Infusions:   PRN Meds:.acetaminophen **OR** acetaminophen, ondansetron **OR** ondansetron (ZOFRAN) IV  Allergies as of 07/25/2023 - Review Complete 07/25/2023  Allergen Reaction Noted   Sulfamethoxazole Anaphylaxis 11/10/2020   Aspirin Other (See Comments) 12/30/2010   Crestor [rosuvastatin]  12/28/2020   Erythromycin Nausea Only 12/23/2010   Lisinopril Cough 12/23/2010   Niacin and related Other (See Comments) 12/30/2010   Penicillin g benzathine  09/06/2017   Statins Other (See Comments) 12/23/2010   Sulfa drugs cross reactors Swelling 12/23/2010   Tetracyclines & related Nausea Only 12/23/2010   Xeloda [capecitabine] Diarrhea 11/10/2020   Penicillins Nausea Only and Rash 12/23/2010   Plavix [clopidogrel bisulfate] Rash 02/20/2018    Family History  Problem Relation Age of Onset   Heart attack Father    Heart disease Father    Heart attack Brother 77   Cancer Mother        lung   Stroke Neg Hx     Social History   Socioeconomic History   Marital status:  Married    Spouse name: Not on file   Number of children: 2   Years of education: 12   Highest education level: Not on file  Occupational History   Occupation: Retired  Tobacco Use   Smoking status: Former    Current packs/day: 0.00    Average packs/day: 0.3 packs/day for 20.0 years (6.0 ttl pk-yrs)    Types: Cigarettes    Start date: 06/06/1961    Quit date: 06/06/1981    Years since quitting: 42.1   Smokeless tobacco: Never  Vaping Use   Vaping status: Never Used  Substance and Sexual Activity   Alcohol use: Yes    Alcohol/week: 1.0 standard drink of alcohol    Types: 1 Glasses of wine per week    Comment: occ   Drug use: No   Sexual activity: Not Currently    Birth control/protection: Surgical  Other Topics Concern   Not on file  Social History Narrative   Patient is married with 2 children.   Patient is right handed.   Patient has a high school education with some college education.   Patient drinks 2 cups daily.   Social Drivers of Corporate investment banker Strain: Not on file  Food Insecurity: No Food Insecurity (07/26/2023)   Hunger Vital Sign    Worried About Running Out of Food in the Last Year: Never true    Ran Out of Food in the Last Year: Never true  Transportation Needs: No Transportation Needs (07/26/2023)   PRAPARE - Administrator, Civil Service (Medical): No    Lack of Transportation (Non-Medical): No  Physical Activity: Not on file  Stress: Not on file  Social Connections: Socially Isolated (07/26/2023)   Social Connection and Isolation Panel [NHANES]    Frequency of Communication with Friends and Family: Once a week    Frequency of Social Gatherings with Friends and Family: Never    Attends Religious Services: Never    Database administrator or Organizations: No    Attends Banker Meetings: Never    Marital Status: Married  Catering manager Violence: Not At Risk (07/26/2023)  Humiliation, Afraid, Rape, and Kick  questionnaire    Fear of Current or Ex-Partner: No    Emotionally Abused: No    Physically Abused: No    Sexually Abused: No    Review of Systems: All negative except as stated above in HPI.  Physical Exam: Vital signs: Vitals:   07/26/23 1100 07/26/23 1130  BP: (!) 115/58 (!) 117/55  Pulse: 76 95  Resp: 18 18  Temp:    SpO2: 100% 100%     General:   Alert,  Well-developed, well-nourished, pleasant and cooperative in NAD Lungs: No visible respiratory distress Heart: Tachycardia Abdomen: Soft, nontender, nondistended, bowel sound present, no peritoneal signs Rectal:  Deferred  GI:  Lab Results: Recent Labs    07/25/23 1624 07/26/23 0355  WBC 6.6 9.5  HGB 11.3* 9.2*  HCT 33.2* 27.9*  PLT 394 341   BMET Recent Labs    07/25/23 1624  NA 132*  K 4.1  CL 101  CO2 23  GLUCOSE 131*  BUN 19  CREATININE 0.79  CALCIUM 8.4*   LFT Recent Labs    07/25/23 1624  PROT 6.4*  ALBUMIN 3.1*  AST 15  ALT 11  ALKPHOS 49  BILITOT 0.4   PT/INR No results for input(s): "LABPROT", "INR" in the last 72 hours.   Studies/Results: No results found.  Impression/Plan: -Rectal bleeding 2 weeks after colonoscopy with large polypectomy and piecemeal resection.  Likely post polypectomy bleeding. -History of coronary artery disease.  Was on aspirin and Effient. -Acute blood loss anemia  Recommendations ------------------------- -Patient with likely post polypectomy bleeding.  Hemoglobin dropped to 8.4.  Recommend to continue holding aspirin and Effient.  If ongoing bleeding, we may consider colonoscopy over the weekend. -Okay to have full liquid diet for now.  Monitor H&H.  Transfuse if hemoglobin less than 7.  GI will follow.  Discussed with Dr. Bosie Clos.   LOS: 0 days   Kathi Der  MD, FACP 07/26/2023, 12:12 PM  Contact #  [phone]

## 2023-07-26 NOTE — Plan of Care (Signed)
  Problem: Education: Goal: Knowledge of General Education information will improve Description: Including pain rating scale, medication(s)/side effects and non-pharmacologic comfort measures Outcome: Progressing   Problem: Clinical Measurements: Goal: Will remain free from infection Outcome: Progressing   Problem: Activity: Goal: Risk for activity intolerance will decrease Outcome: Progressing   Problem: Nutrition: Goal: Adequate nutrition will be maintained Outcome: Progressing   Problem: Coping: Goal: Level of anxiety will decrease Outcome: Progressing   Problem: Safety: Goal: Ability to remain free from injury will improve Outcome: Progressing   Problem: Skin Integrity: Goal: Risk for impaired skin integrity will decrease Outcome: Progressing   Problem: Clinical Measurements: Goal: Ability to maintain clinical measurements within normal limits will improve Outcome: Not Progressing

## 2023-07-27 ENCOUNTER — Ambulatory Visit: Payer: Medicare Other | Admitting: Nurse Practitioner

## 2023-07-27 ENCOUNTER — Ambulatory Visit: Payer: Medicare Other | Admitting: Student

## 2023-07-27 ENCOUNTER — Telehealth: Payer: Self-pay | Admitting: Cardiology

## 2023-07-27 DIAGNOSIS — Z7902 Long term (current) use of antithrombotics/antiplatelets: Secondary | ICD-10-CM | POA: Diagnosis not present

## 2023-07-27 DIAGNOSIS — K922 Gastrointestinal hemorrhage, unspecified: Secondary | ICD-10-CM | POA: Diagnosis not present

## 2023-07-27 DIAGNOSIS — K921 Melena: Secondary | ICD-10-CM | POA: Diagnosis not present

## 2023-07-27 DIAGNOSIS — D62 Acute posthemorrhagic anemia: Secondary | ICD-10-CM | POA: Diagnosis not present

## 2023-07-27 DIAGNOSIS — K625 Hemorrhage of anus and rectum: Secondary | ICD-10-CM | POA: Diagnosis not present

## 2023-07-27 LAB — BASIC METABOLIC PANEL
Anion gap: 7 (ref 5–15)
BUN: 17 mg/dL (ref 8–23)
CO2: 22 mmol/L (ref 22–32)
Calcium: 8 mg/dL — ABNORMAL LOW (ref 8.9–10.3)
Chloride: 104 mmol/L (ref 98–111)
Creatinine, Ser: 0.86 mg/dL (ref 0.44–1.00)
GFR, Estimated: >60 mL/min (ref >60)
Glucose, Bld: 119 mg/dL — ABNORMAL HIGH (ref 70–99)
Potassium: 3.5 mmol/L (ref 3.5–5.1)
Sodium: 133 mmol/L — ABNORMAL LOW (ref 135–145)

## 2023-07-27 LAB — MAGNESIUM: Magnesium: 1.7 mg/dL (ref 1.7–2.4)

## 2023-07-27 LAB — HEMOGLOBIN AND HEMATOCRIT, BLOOD
HCT: 21.5 % — ABNORMAL LOW (ref 36.0–46.0)
HCT: 21.6 % — ABNORMAL LOW (ref 36.0–46.0)
Hemoglobin: 7.5 g/dL — ABNORMAL LOW (ref 12.0–15.0)
Hemoglobin: 7.5 g/dL — ABNORMAL LOW (ref 12.0–15.0)

## 2023-07-27 MED ORDER — PEG 3350-KCL-NA BICARB-NACL 420 G PO SOLR
4000.0000 mL | Freq: Once | ORAL | Status: DC
  Filled 2023-07-27: qty 4000

## 2023-07-27 NOTE — Telephone Encounter (Signed)
Husband called to let us know that the patient is in the hospital. And would like for the dr to review the notes. Please advise

## 2023-07-27 NOTE — H&P (View-Only) (Signed)
[facility] Gastroenterology Progress Note  Rachel Clark 73 y.o. Oct 01, 1950  CC: Rectal bleeding   Subjective: Patient seen and examined at bedside.  No bowel movement today.  Complaining of weakness.  ROS : Negative for fever, nausea and vomiting.   Objective: Vital signs in last 24 hours: Vitals:   07/27/23 0457 07/27/23 0900  BP: (!) 120/59 (!) 142/57  Pulse: 83 77  Resp: 19 19  Temp: (!) 97.5 F (36.4 C) 98.2 F (36.8 C)  SpO2: 98% 98%    Physical Exam:  General:  Alert, cooperative, no distress, appears stated age  Head:  Normocephalic, without obvious abnormality, atraumatic  Eyes:  , EOM's intact,   Lungs:   No visible respiratory distress  Heart:  Regular rate and rhythm, S1, S2 normal  Abdomen:   Soft, non-tender, bowel sounds active all four quadrants,  no masses,   Extremities: Extremities normal, atraumatic, no  edema  Pulses: 2+ and symmetric    Lab Results: Recent Labs    07/25/23 1624 07/27/23 0518  NA 132* 133*  K 4.1 3.5  CL 101 104  CO2 23 22  GLUCOSE 131* 119*  BUN 19 17  CREATININE 0.79 0.86  CALCIUM 8.4* 8.0*  MG  --  1.7   Recent Labs    07/25/23 1624  AST 15  ALT 11  ALKPHOS 49  BILITOT 0.4  PROT 6.4*  ALBUMIN 3.1*   Recent Labs    07/25/23 1624 07/26/23 0355 07/26/23 1252 07/26/23 1647 07/27/23 0518  WBC 6.6 9.5  --   --   --   NEUTROABS 4.5  --   --   --   --   HGB 11.3* 9.2*   < > 8.8* 7.5*  HCT 33.2* 27.9*   < > 25.7* 21.5*  MCV 89.5 91.5  --   --   --   PLT 394 341  --   --   --    < > = values in this interval not displayed.   No results for input(s): "LABPROT", "INR" in the last 72 hours.    Assessment/Plan: -Rectal bleeding 2 weeks after colonoscopy with large polypectomy and piecemeal resection.  Likely post polypectomy bleeding. -History of coronary artery disease.  Was on aspirin and Effient. -Acute blood loss anemia   Recommendations ------------------------- -Patient with likely post  polypectomy bleeding.  She has ongoing drop in hemoglobin with hemoglobin down to 7.5.  Given her need for antiplatelets, I think is reasonable to pursue colonoscopy for treatment of post polypectomy site. -Tentatively plan for colonoscopy tomorrow.  Risks (bleeding, infection, bowel perforation that could require surgery, sedation-related changes in cardiopulmonary systems), benefits (identification and possible treatment of source of symptoms, exclusion of certain causes of symptoms), and alternatives (watchful waiting, radiographic imaging studies, empiric medical treatment)  were explained to patient/family in detail and patient wishes to proceed.    Kathi Der MD, FACP 07/27/2023, 12:21 PM  Contact #  [phone]

## 2023-07-27 NOTE — Progress Notes (Addendum)
PROGRESS NOTE  Rachel FASCHING UJW:[id number] DOB: 01/07/1951 DOA: 07/25/2023 PCP: Laurann Montana, MD   LOS: 0 days   Brief narrative:  Rachel Clark is a 73 y.o. female with medical history significant of colonic resection, CAD status post heart catheterization, seizure, hyperlipidemia, hypothyroidism presented to the emergency with bright red blood per rectum.  Of note patient did have a colonoscopy 2 weeks ago when multiple polyps were removed.  Patient was holding her aspirin and Plavix until 3 days ago when she started taking it and started having rectal bleed.  In the ED, patient was noted to have a hemoglobin of 11.3 with mild hyponatremia at 132.  Patient was hemodynamically stable.  GI was consulted and the patient was admitted to hospital for further evaluation and treatment.   Assessment/Plan: Principal Problem:   Hematochezia Active Problems:   Coronary artery disease, post CABG 2007    Dyslipidemia   Hypothyroid  Hematochezia Acute blood loss anemia most likely secondary to post polypectomy bleed Colonoscopy with polypectomy 2 weeks back.  GI on board.  Hemoglobin had dropped from 14-11.3.  On clears. GI has seen the patient and at this time conservative treatment underway.  Will continue to monitor hemoglobin every 12.  Latest hemoglobin of 7.5 from previous 8.8 and 8.4.  Has not had bowel movement since yesterday.  Will continue to monitor off of antiplatelets.   History of CAD-will continue to hold aspirin, prasugrel continue Imdur   Hypothyroidism-continue levothyroxine   Cognitive impairment-continue Namenda, had some confusion overnight.  Will provide supportive care.   GERD-continue Protonix   Chronic pain-continue Cymbalta   Hypertension-continue amlodipine.  Blood pressure seems to be stable.   History of grand mal seizure-continue Tegretol, ethosuximide.  No active issues.  DVT prophylaxis: Place TED hose Start: 07/26/23 0017  Disposition: Likely  home in 1 to 2 days.  Status is: Observation  The patient will require care spanning > 2 midnights and should be moved to inpatient because: Post polypectomy bleed, continued monitoring, GI follow-up    Code Status:     Code Status: Full Code  Family Communication: None at bedside.  Spoke with the patient's husband on the phone and updated him about the clinical condition of the patient.  Consultants: GI  Procedures: None yet.  Anti-infectives:  None  Anti-infectives (From admission, onward)    None       Subjective: Today, patient was seen and examined at bedside.  Nursing staff reported that she was confused yesterday.  Has not had further bowel movements.  Patient denies any nausea, vomiting, abdominal pain.  Still on some clear liquids.  Objective: Vitals:   07/27/23 0457 07/27/23 0900  BP: (!) 120/59 (!) 142/57  Pulse: 83 77  Resp: 19 19  Temp: (!) 97.5 F (36.4 C) 98.2 F (36.8 C)  SpO2: 98% 98%   No intake or output data in the 24 hours ending 07/27/23 0941 Filed Weights   07/25/23 1618 07/25/23 2012  Weight: 78.5 kg 77.1 kg   Body mass index is 25.85 kg/m.   Physical Exam: GENERAL: Patient is alert awake and Communicative not in obvious distress. HENT: Mild pallor noted pupils equally reactive to light. Oral mucosa is moist NECK: is supple, no gross swelling noted. CHEST: Clear to auscultation. No crackles or wheezes.  Diminished breath sounds bilaterally. CVS: S1 and S2 heard, no murmur. Regular rate and rhythm.  ABDOMEN: Soft, mild tenderness noted. EXTREMITIES: No edema. CNS: Cranial nerves are intact. No focal  motor deficits. SKIN: warm and dry without rashes.  Data Review: I have personally reviewed the following laboratory data and studies,  CBC: Recent Labs  Lab 07/25/23 1624 07/26/23 0355 07/26/23 1252 07/26/23 1647 07/27/23 0518  WBC 6.6 9.5  --   --   --   NEUTROABS 4.5  --   --   --   --   HGB 11.3* 9.2* 8.4* 8.8* 7.5*  HCT  33.2* 27.9* 25.2* 25.7* 21.5*  MCV 89.5 91.5  --   --   --   PLT 394 341  --   --   --    Basic Metabolic Panel: Recent Labs  Lab 07/25/23 1624 07/27/23 0518  NA 132* 133*  K 4.1 3.5  CL 101 104  CO2 23 22  GLUCOSE 131* 119*  BUN 19 17  CREATININE 0.79 0.86  CALCIUM 8.4* 8.0*  MG  --  1.7   Liver Function Tests: Recent Labs  Lab 07/25/23 1624  AST 15  ALT 11  ALKPHOS 49  BILITOT 0.4  PROT 6.4*  ALBUMIN 3.1*   Recent Labs  Lab 07/25/23 1624  LIPASE 28   No results for input(s): "AMMONIA" in the last 168 hours. Cardiac Enzymes: No results for input(s): "CKTOTAL", "CKMB", "CKMBINDEX", "TROPONINI" in the last 168 hours. BNP (last 3 results) No results for input(s): "BNP" in the last 8760 hours.  ProBNP (last 3 results) No results for input(s): "PROBNP" in the last 8760 hours.  CBG: No results for input(s): "GLUCAP" in the last 168 hours. No results found for this or any previous visit (from the past 240 hours).   Studies: No results found.    Joycelyn Das, MD  [facility] 07/27/2023  If 7PM-7AM, please contact night-coverage

## 2023-07-27 NOTE — Progress Notes (Signed)
[facility] Gastroenterology Progress Note  Rachel Clark 73 y.o. Oct 01, 1950  CC: Rectal bleeding   Subjective: Patient seen and examined at bedside.  No bowel movement today.  Complaining of weakness.  ROS : Negative for fever, nausea and vomiting.   Objective: Vital signs in last 24 hours: Vitals:   07/27/23 0457 07/27/23 0900  BP: (!) 120/59 (!) 142/57  Pulse: 83 77  Resp: 19 19  Temp: (!) 97.5 F (36.4 C) 98.2 F (36.8 C)  SpO2: 98% 98%    Physical Exam:  General:  Alert, cooperative, no distress, appears stated age  Head:  Normocephalic, without obvious abnormality, atraumatic  Eyes:  , EOM's intact,   Lungs:   No visible respiratory distress  Heart:  Regular rate and rhythm, S1, S2 normal  Abdomen:   Soft, non-tender, bowel sounds active all four quadrants,  no masses,   Extremities: Extremities normal, atraumatic, no  edema  Pulses: 2+ and symmetric    Lab Results: Recent Labs    07/25/23 1624 07/27/23 0518  NA 132* 133*  K 4.1 3.5  CL 101 104  CO2 23 22  GLUCOSE 131* 119*  BUN 19 17  CREATININE 0.79 0.86  CALCIUM 8.4* 8.0*  MG  --  1.7   Recent Labs    07/25/23 1624  AST 15  ALT 11  ALKPHOS 49  BILITOT 0.4  PROT 6.4*  ALBUMIN 3.1*   Recent Labs    07/25/23 1624 07/26/23 0355 07/26/23 1252 07/26/23 1647 07/27/23 0518  WBC 6.6 9.5  --   --   --   NEUTROABS 4.5  --   --   --   --   HGB 11.3* 9.2*   < > 8.8* 7.5*  HCT 33.2* 27.9*   < > 25.7* 21.5*  MCV 89.5 91.5  --   --   --   PLT 394 341  --   --   --    < > = values in this interval not displayed.   No results for input(s): "LABPROT", "INR" in the last 72 hours.    Assessment/Plan: -Rectal bleeding 2 weeks after colonoscopy with large polypectomy and piecemeal resection.  Likely post polypectomy bleeding. -History of coronary artery disease.  Was on aspirin and Effient. -Acute blood loss anemia   Recommendations ------------------------- -Patient with likely post  polypectomy bleeding.  She has ongoing drop in hemoglobin with hemoglobin down to 7.5.  Given her need for antiplatelets, I think is reasonable to pursue colonoscopy for treatment of post polypectomy site. -Tentatively plan for colonoscopy tomorrow.  Risks (bleeding, infection, bowel perforation that could require surgery, sedation-related changes in cardiopulmonary systems), benefits (identification and possible treatment of source of symptoms, exclusion of certain causes of symptoms), and alternatives (watchful waiting, radiographic imaging studies, empiric medical treatment)  were explained to patient/family in detail and patient wishes to proceed.    Kathi Der MD, FACP 07/27/2023, 12:21 PM  Contact #  [phone]

## 2023-07-27 NOTE — TOC Initial Note (Signed)
Transition of Care [facility]) - Initial/Assessment Note    Patient Details  Name: Rachel Clark MRN: [id number] Date of Birth: [birth year]  Transition of Care [facility]) CM/SW Contact:    Marliss Coots, LCSW Phone Number: 07/27/2023, 10:06 AM  Clinical Narrative:                  10:06 AM Per progressions, patient appears to have dementia. Patient was evaluated in ED for blood in stool which is likely caused by recontinuation of blood thinners upon polyps removal.    Barriers to Discharge: Continued Medical Work up   Patient Goals and CMS Choice            Expected Discharge Plan and Services       Living arrangements for the past 2 months: Single Family Home                                      Prior Living Arrangements/Services Living arrangements for the past 2 months: Single Family Home Lives with:: Spouse, Adult Children Patient language and need for interpreter reviewed:: Yes        Need for Family Participation in Patient Care: Yes (Comment) Care giver support system in place?: Yes (comment)   Criminal Activity/Legal Involvement Pertinent to Current Situation/Hospitalization: No - Comment as needed  Activities of Daily Living   ADL Screening (condition at time of admission) Independently performs ADLs?: Yes (appropriate for developmental age) Is the patient deaf or have difficulty hearing?: No Does the patient have difficulty seeing, even when wearing glasses/contacts?: No Does the patient have difficulty concentrating, remembering, or making decisions?: Yes  Permission Sought/Granted Permission sought to share information with : Family Supports Permission granted to share information with : No (Contact information on chart)  Share Information with NAME: Caleigha Zale     Permission granted to share info w Relationship: Spouse  Permission granted to share info w Contact Information: [phone]  Emotional Assessment    Attitude/Demeanor/Rapport: Unable to Assess Affect (typically observed): Unable to Assess Orientation: : Oriented to Self Alcohol / Substance Use: Other (comment) Psych Involvement: No (comment)  Admission diagnosis:  Hematochezia [K92.1] Patient Active Problem List   Diagnosis Date Noted   Hematochezia 07/26/2023   Dementia (HCC) 04/26/2023   Right leg pain 01/13/2022   Hypocalcemia 01/13/2022   NSTEMI (non-ST elevated myocardial infarction) (HCC) 11/11/2021   Presence of stent in left circumflex coronary artery    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    Non-ST elevation (NSTEMI) myocardial infarction (HCC) 11/10/2021   Acute upper GI bleed 03/13/2021   GI bleed 03/13/2021   Preventive measure 01/14/2021   Orthostatic hypotension 12/21/2020   Memory change 11/13/2016   Acute bronchitis 03/23/2016   Chest pain with high risk for cardiac etiology 11/05/2015   Essential hypertension 08/25/2015   Sick sinus syndrome (HCC) 08/16/2015   Syncope and collapse 08/10/2015   CAD, multiple vessel    Chest pain    Cardiac pacemaker in situ 07/25/2015   Angina pectoris (HCC) 03/24/2015   Unstable angina (HCC) 10/26/2014   Seizures (HCC) 02/19/2014   Lung nodule 02/06/2013   Thyroid nodule 02/06/2013   History of colon cancer 12/04/2011   Iron deficiency anemia due to chronic blood loss 11/17/2011   GERD (gastroesophageal reflux disease) 08/17/2011   Monoclonal gammopathy of unknown significance    Hypothyroid    Coronary artery disease,  post CABG 2007     Dyslipidemia    PCP:  Laurann Montana, MD Pharmacy:   [facility] [phone] [address] [address], [place] - [address] [place] [address] Phone: [phone] Fax: [phone]  [facility] [address] [place] - [address] [place] [address] Phone: [phone] Fax: [phone]     Social Drivers of Health (SDOH) Social History: SDOH Screenings    Food Insecurity: No Food Insecurity (07/26/2023)  Housing: Low Risk  (07/26/2023)  Transportation Needs: No Transportation Needs (07/26/2023)  Utilities: Not At Risk (07/26/2023)  Depression (PHQ2-9): Low Risk  (06/22/2022)  Social Connections: Socially Isolated (07/26/2023)  Tobacco Use: Medium Risk (07/25/2023)   SDOH Interventions:     Readmission Risk Interventions     No data to display

## 2023-07-28 ENCOUNTER — Observation Stay (HOSPITAL_COMMUNITY): Payer: Medicare Other | Admitting: Anesthesiology

## 2023-07-28 ENCOUNTER — Encounter (HOSPITAL_COMMUNITY): Payer: Self-pay | Admitting: Internal Medicine

## 2023-07-28 ENCOUNTER — Encounter (HOSPITAL_COMMUNITY)
Admission: EM | Disposition: A | Discharge status: Home / Self Care | Payer: Self-pay | Source: Home / Self Care | Attending: Internal Medicine

## 2023-07-28 DIAGNOSIS — K644 Residual hemorrhoidal skin tags: Secondary | ICD-10-CM | POA: Diagnosis present

## 2023-07-28 DIAGNOSIS — K922 Gastrointestinal hemorrhage, unspecified: Secondary | ICD-10-CM | POA: Diagnosis not present

## 2023-07-28 DIAGNOSIS — Z79890 Hormone replacement therapy (postmenopausal): Secondary | ICD-10-CM | POA: Diagnosis not present

## 2023-07-28 DIAGNOSIS — K648 Other hemorrhoids: Secondary | ICD-10-CM | POA: Diagnosis present

## 2023-07-28 DIAGNOSIS — Z8249 Family history of ischemic heart disease and other diseases of the circulatory system: Secondary | ICD-10-CM | POA: Diagnosis not present

## 2023-07-28 DIAGNOSIS — K625 Hemorrhage of anus and rectum: Secondary | ICD-10-CM | POA: Diagnosis not present

## 2023-07-28 DIAGNOSIS — K5731 Diverticulosis of large intestine without perforation or abscess with bleeding: Secondary | ICD-10-CM | POA: Diagnosis present

## 2023-07-28 DIAGNOSIS — K2101 Gastro-esophageal reflux disease with esophagitis, with bleeding: Secondary | ICD-10-CM | POA: Diagnosis present

## 2023-07-28 DIAGNOSIS — K254 Chronic or unspecified gastric ulcer with hemorrhage: Secondary | ICD-10-CM | POA: Diagnosis present

## 2023-07-28 DIAGNOSIS — D123 Benign neoplasm of transverse colon: Secondary | ICD-10-CM | POA: Diagnosis not present

## 2023-07-28 DIAGNOSIS — K91840 Postprocedural hemorrhage and hematoma of a digestive system organ or structure following a digestive system procedure: Secondary | ICD-10-CM | POA: Diagnosis present

## 2023-07-28 DIAGNOSIS — G40409 Other generalized epilepsy and epileptic syndromes, not intractable, without status epilepticus: Secondary | ICD-10-CM | POA: Diagnosis present

## 2023-07-28 DIAGNOSIS — M797 Fibromyalgia: Secondary | ICD-10-CM | POA: Diagnosis present

## 2023-07-28 DIAGNOSIS — Z7902 Long term (current) use of antithrombotics/antiplatelets: Secondary | ICD-10-CM | POA: Diagnosis not present

## 2023-07-28 DIAGNOSIS — Y838 Other surgical procedures as the cause of abnormal reaction of the patient, or of later complication, without mention of misadventure at the time of the procedure: Secondary | ICD-10-CM | POA: Diagnosis present

## 2023-07-28 DIAGNOSIS — K633 Ulcer of intestine: Secondary | ICD-10-CM | POA: Diagnosis present

## 2023-07-28 DIAGNOSIS — K5520 Angiodysplasia of colon without hemorrhage: Secondary | ICD-10-CM | POA: Diagnosis not present

## 2023-07-28 DIAGNOSIS — Z955 Presence of coronary angioplasty implant and graft: Secondary | ICD-10-CM | POA: Diagnosis not present

## 2023-07-28 DIAGNOSIS — K921 Melena: Secondary | ICD-10-CM | POA: Diagnosis present

## 2023-07-28 DIAGNOSIS — K5730 Diverticulosis of large intestine without perforation or abscess without bleeding: Secondary | ICD-10-CM | POA: Diagnosis not present

## 2023-07-28 DIAGNOSIS — E039 Hypothyroidism, unspecified: Secondary | ICD-10-CM | POA: Diagnosis present

## 2023-07-28 DIAGNOSIS — E785 Hyperlipidemia, unspecified: Secondary | ICD-10-CM | POA: Diagnosis present

## 2023-07-28 DIAGNOSIS — K635 Polyp of colon: Secondary | ICD-10-CM | POA: Diagnosis not present

## 2023-07-28 DIAGNOSIS — K5521 Angiodysplasia of colon with hemorrhage: Secondary | ICD-10-CM | POA: Diagnosis present

## 2023-07-28 DIAGNOSIS — G8929 Other chronic pain: Secondary | ICD-10-CM | POA: Diagnosis present

## 2023-07-28 DIAGNOSIS — D122 Benign neoplasm of ascending colon: Secondary | ICD-10-CM

## 2023-07-28 DIAGNOSIS — Z950 Presence of cardiac pacemaker: Secondary | ICD-10-CM | POA: Diagnosis not present

## 2023-07-28 DIAGNOSIS — Z951 Presence of aortocoronary bypass graft: Secondary | ICD-10-CM | POA: Diagnosis not present

## 2023-07-28 DIAGNOSIS — D62 Acute posthemorrhagic anemia: Secondary | ICD-10-CM | POA: Diagnosis present

## 2023-07-28 DIAGNOSIS — Z87891 Personal history of nicotine dependence: Secondary | ICD-10-CM | POA: Diagnosis not present

## 2023-07-28 DIAGNOSIS — I2510 Atherosclerotic heart disease of native coronary artery without angina pectoris: Secondary | ICD-10-CM | POA: Diagnosis present

## 2023-07-28 DIAGNOSIS — E871 Hypo-osmolality and hyponatremia: Secondary | ICD-10-CM | POA: Diagnosis present

## 2023-07-28 DIAGNOSIS — I10 Essential (primary) hypertension: Secondary | ICD-10-CM | POA: Diagnosis present

## 2023-07-28 HISTORY — PX: HOT HEMOSTASIS: SHX5433

## 2023-07-28 HISTORY — PX: HEMOSTASIS CLIP PLACEMENT: SHX6857

## 2023-07-28 HISTORY — PX: COLONOSCOPY WITH PROPOFOL: SHX5780

## 2023-07-28 LAB — CBC
HCT: 22.7 % — ABNORMAL LOW (ref 36.0–46.0)
Hemoglobin: 7.7 g/dL — ABNORMAL LOW (ref 12.0–15.0)
MCH: 30.9 pg (ref 26.0–34.0)
MCHC: 33.9 g/dL (ref 30.0–36.0)
MCV: 91.2 fL (ref 80.0–100.0)
Platelets: 296 10*3/uL (ref 150–400)
RBC: 2.49 MIL/uL — ABNORMAL LOW (ref 3.87–5.11)
RDW: 12.9 % (ref 11.5–15.5)
WBC: 6.3 10*3/uL (ref 4.0–10.5)
nRBC: 0 % (ref 0.0–0.2)

## 2023-07-28 SURGERY — COLONOSCOPY WITH PROPOFOL
Anesthesia: Monitor Anesthesia Care

## 2023-07-28 MED ORDER — SODIUM CHLORIDE 0.9 % IV SOLN: INTRAVENOUS | Status: DC | PRN

## 2023-07-28 MED ORDER — PROPOFOL 500 MG/50ML IV EMUL
INTRAVENOUS | Status: DC | PRN
  Administered 2023-07-28: 100 ug/kg/min via INTRAVENOUS

## 2023-07-28 MED ORDER — PROPOFOL 10 MG/ML IV BOLUS
INTRAVENOUS | Status: DC | PRN
  Administered 2023-07-28: 20 mg via INTRAVENOUS

## 2023-07-28 SURGICAL SUPPLY — 21 items
ELECT REM PT RETURN 9FT ADLT (ELECTROSURGICAL)
ELECTRODE REM PT RTRN 9FT ADLT (ELECTROSURGICAL) IMPLANT
FCP BXJMBJMB 240X2.8X (CUTTING FORCEPS)
FLOOR PAD 36X40 (MISCELLANEOUS) ×2
FORCEPS BIOP RAD 4 LRG CAP 4 (CUTTING FORCEPS) IMPLANT
FORCEPS BIOP RJ4 240 W/NDL (CUTTING FORCEPS)
FORCEPS BXJMBJMB 240X2.8X (CUTTING FORCEPS) IMPLANT
INJECTOR/SNARE I SNARE (MISCELLANEOUS) IMPLANT
LUBRICANT JELLY 4.5OZ STERILE (MISCELLANEOUS) IMPLANT
MANIFOLD NEPTUNE II (INSTRUMENTS) IMPLANT
NDL SCLEROTHERAPY 25GX240 (NEEDLE) IMPLANT
NEEDLE SCLEROTHERAPY 25GX240 (NEEDLE) IMPLANT
PAD FLOOR 36X40 (MISCELLANEOUS) ×2 IMPLANT
PROBE APC STR FIRE (PROBE) IMPLANT
PROBE INJECTION GOLD 7FR (MISCELLANEOUS) IMPLANT
SNARE ROTATE MED OVAL 20MM (MISCELLANEOUS) IMPLANT
SYR 50ML LL SCALE MARK (SYRINGE) IMPLANT
TRAP SPECIMEN MUCOUS 40CC (MISCELLANEOUS) IMPLANT
TUBING ENDO SMARTCAP PENTAX (MISCELLANEOUS) IMPLANT
TUBING IRRIGATION ENDOGATOR (MISCELLANEOUS) ×2 IMPLANT
WATER STERILE IRR 1000ML POUR (IV SOLUTION) IMPLANT

## 2023-07-28 NOTE — Discharge Summary (Signed)
Physician Discharge Summary  BONNELL PLACZEK ZOX:[id number] DOB: [birth year] DOA: 07/25/2023  PCP: Laurann Montana, MD  Admit date: 07/25/2023 Discharge date: 07/28/2023  Admitted From: Home  Discharge disposition: Home   Recommendations for Outpatient Follow-Up:   Follow up with your primary care provider in one week.  Check CBC, BMP, magnesium in the next visit  Discharge Diagnosis:   Principal Problem:   Hematochezia Active Problems:   Coronary artery disease, post CABG 2007    Dyslipidemia   Hypothyroid   GI bleed   Discharge Condition: Improved.  Diet recommendation: Low sodium, heart healthy.   Wound care: None.  Code status: Full.   History of Present Illness:   Rachel Clark is a 73 y.o. female with medical history significant of colonic resection, CAD status post heart catheterization, seizure, hyperlipidemia, hypothyroidism presented to the emergency with bright red blood per rectum.  Of note patient did have a colonoscopy 2 weeks ago when multiple polyps were removed.  Patient was holding her aspirin and Plavix until 3 days ago when she started taking it and started having rectal bleed.  In the ED, patient was noted to have a hemoglobin of 11.3 with mild hyponatremia at 132.  Patient was hemodynamically stable.  GI was consulted and the patient was admitted to hospital for further evaluation and treatment.    Hospital Course:   Following conditions were addressed during hospitalization as listed below,  Hematochezia Acute blood loss anemia most likely secondary to post polypectomy bleed Patient with history of recent colonoscopy with polypectomy 2 weeks back.  GI of the patient during hospitalization and due to hematochezia and drop in hemoglobin plan was to proceed with colonoscopy.  Patient underwent colonoscopic evaluation 07/28/2023 with findings of post polypectomy ulcer and 4 clips were placed.  Few AVMs that were oozing with contact were also  treated with APC.  At this time GI is okay about patient getting discharged home and was okay to resume Effient after 3 days if no further bleeding.    History of CAD-will continue to hold aspirin, prasugrel until 07/31/2023 then restart as per GI recommendations..  Continue Imdur   Hypothyroidism-continue levothyroxine   Cognitive impairment-continue Namena on discharge.  GERD-continue Protonix   Chronic pain-continue Cymbalta   Hypertension-continue amlodipine.  Blood pressure seems to be stable.  Resume on discharge.   History of grand mal seizure-continue Tegretol, ethosuximide.  No active issues.  Disposition.  At this time, patient is stable for disposition home with outpatient PCP follow-up.  Spoke with the patient's husband prior to discharge.  Medical Consultants:   None.  Procedures:    Colonoscopy with placement of hemostatic clips, APC treatment Subjective:   Today, patient was seen and examined at bedside.  Seen before colonoscopy evaluation.  Denies any abdominal pain nausea vomiting fever chills.  Discharge Exam:   Vitals:   07/28/23 1035 07/28/23 1050  BP: 120/64 (!) 114/49  Pulse: 68 69  Resp: 14   Temp: 97.7 F (36.5 C)   SpO2: 97% 100%   Vitals:   07/28/23 1020 07/28/23 1030 07/28/23 1035 07/28/23 1050  BP: (!) 106/47 (!) 108/54 120/64 (!) 114/49  Pulse: 80 75 68 69  Resp: 17 17 14    Temp: 97.6 F (36.4 C)  97.7 F (36.5 C)   TempSrc:      SpO2: 99% 97% 97% 100%  Weight:      Height:       Body mass index is 25.85 kg/m.  General: Alert awake, not in obvious distress HENT: pupils equally reacting to light, pallor noted.  Oral mucosa is moist.  Chest:  Clear breath sounds.  No crackles or wheezes.  CVS: S1 &S2 heard. No murmur.  Regular rate and rhythm. Abdomen: Soft, nontender, nondistended.  Bowel sounds are heard.   Extremities: No cyanosis, clubbing or edema.  Peripheral pulses are palpable. Psych: Alert, awake and oriented, normal  mood CNS:  No cranial nerve deficits.  Power equal in all extremities.   Skin: Warm and dry.  No rashes noted.  The results of significant diagnostics from this hospitalization (including imaging, microbiology, ancillary and laboratory) are listed below for reference.     Diagnostic Studies:   No results found.   Labs:   Basic Metabolic Panel: Recent Labs  Lab 07/25/23 1624 07/27/23 0518  NA 132* 133*  K 4.1 3.5  CL 101 104  CO2 23 22  GLUCOSE 131* 119*  BUN 19 17  CREATININE 0.79 0.86  CALCIUM 8.4* 8.0*  MG  --  1.7   GFR Estimated Creatinine Clearance: 62.7 mL/min (by C-G formula based on SCr of 0.86 mg/dL). Liver Function Tests: Recent Labs  Lab 07/25/23 1624  AST 15  ALT 11  ALKPHOS 49  BILITOT 0.4  PROT 6.4*  ALBUMIN 3.1*   Recent Labs  Lab 07/25/23 1624  LIPASE 28   No results for input(s): "AMMONIA" in the last 168 hours. Coagulation profile No results for input(s): "INR", "PROTIME" in the last 168 hours.  CBC: Recent Labs  Lab 07/25/23 1624 07/26/23 0355 07/26/23 1252 07/26/23 1647 07/27/23 0518 07/27/23 1651 07/28/23 0413  WBC 6.6 9.5  --   --   --   --  6.3  NEUTROABS 4.5  --   --   --   --   --   --   HGB 11.3* 9.2* 8.4* 8.8* 7.5* 7.5* 7.7*  HCT 33.2* 27.9* 25.2* 25.7* 21.5* 21.6* 22.7*  MCV 89.5 91.5  --   --   --   --  91.2  PLT 394 341  --   --   --   --  296   Cardiac Enzymes: No results for input(s): "CKTOTAL", "CKMB", "CKMBINDEX", "TROPONINI" in the last 168 hours. BNP: Invalid input(s): "POCBNP" CBG: No results for input(s): "GLUCAP" in the last 168 hours. D-Dimer No results for input(s): "DDIMER" in the last 72 hours. Hgb A1c No results for input(s): "HGBA1C" in the last 72 hours. Lipid Profile No results for input(s): "CHOL", "HDL", "LDLCALC", "TRIG", "CHOLHDL", "LDLDIRECT" in the last 72 hours. Thyroid function studies No results for input(s): "TSH", "T4TOTAL", "T3FREE", "THYROIDAB" in the last 72 hours.  Invalid  input(s): "FREET3" Anemia work up No results for input(s): "VITAMINB12", "FOLATE", "FERRITIN", "TIBC", "IRON", "RETICCTPCT" in the last 72 hours. Microbiology No results found for this or any previous visit (from the past 240 hours).   Discharge Instructions:   Discharge Instructions     Call MD for:  persistant nausea and vomiting   Complete by: As directed    Call MD for:  severe uncontrolled pain   Complete by: As directed    Diet general   Complete by: As directed    Discharge instructions   Complete by: As directed    Follow-up with your primary care provider in 1 week.  Check blood work at that time.  Seek medical attention for worsening symptoms including bleeding.   Increase activity slowly   Complete by: As directed  Allergies as of 07/28/2023       Reactions   Sulfamethoxazole Anaphylaxis   Don't recall   Aspirin Other (See Comments)   GI upset- can tolerate 81 mg ASA,and chewables, just not full doses   Crestor [rosuvastatin]    myalgias   Erythromycin Nausea Only   Gi upset   Lisinopril Cough   Niacin And Related Other (See Comments)   Don't recall   Penicillin G Benzathine    Other reaction(s): Unknown   Statins Other (See Comments)   Muscle pain   Sulfa Drugs Cross Reactors Swelling   Don't recall   Tetracyclines & Related Nausea Only   Xeloda [capecitabine] Diarrhea   Penicillins Nausea Only, Rash      Plavix [clopidogrel Bisulfate] Rash        Medication List     PAUSE taking these medications    aspirin EC 81 MG tablet Wait to take this until: July 31, 2023 Morning Take 1 tablet (81 mg total) by mouth daily. Swallow whole.   prasugrel 10 MG Tabs tablet Wait to take this until: July 31, 2023 Commonly known as: EFFIENT Take 1 tablet (10 mg total) by mouth daily.       TAKE these medications    acetaminophen 500 MG tablet Commonly known as: TYLENOL Take 500-1,000 mg by mouth daily as needed for mild pain or headache.    amLODipine 5 MG tablet Commonly known as: NORVASC TAKE 1 TABLET (5 MG TOTAL) BY MOUTH DAILY.   calcium carbonate 500 MG chewable tablet Commonly known as: TUMS - dosed in mg elemental calcium Chew 500 mg by mouth daily.   carbamazepine 200 MG 12 hr tablet Commonly known as: TEGRETOL XR Take 3 tablets (600 mg total) by mouth 2 (two) times daily.   diphenhydrAMINE 25 MG tablet Commonly known as: BENADRYL Take 25 mg by mouth daily as needed for allergies.   diphenhydramine-acetaminophen 25-500 MG Tabs tablet Commonly known as: TYLENOL PM Take 1 tablet by mouth at bedtime as needed (Sleep).   DULoxetine 60 MG capsule Commonly known as: CYMBALTA Take 60 mg by mouth 2 (two) times daily.   ethosuximide 250 MG capsule Commonly known as: ZARONTIN Take 2 capsules (500 mg total) by mouth 2 (two) times daily. TAKE 2 CAPSULES IN THE MORNING, TAKE 2 CAPSULES AT NIGHT   fluticasone 50 MCG/ACT nasal spray Commonly known as: FLONASE Place 1 spray into both nostrils daily as needed for allergies.   isosorbide mononitrate 30 MG 24 hr tablet Commonly known as: IMDUR Take one-half tablet by mouth every day   levothyroxine 50 MCG tablet Commonly known as: SYNTHROID Take 50 mcg by mouth daily before breakfast.   memantine 10 MG tablet Commonly known as: NAMENDA Take 1 tablet (10 mg total) by mouth 2 (two) times daily.   Nexlizet 180-10 MG Tabs Generic drug: Bempedoic Acid-Ezetimibe Take 180 mg by mouth daily.   nitroGLYCERIN 0.4 MG SL tablet Commonly known as: NITROSTAT place 1 tablet under the tongue every 5 minutes as needed for chest pain   nystatin powder Commonly known as: MYCOSTATIN/NYSTOP Apply 1 Application topically 3 (three) times daily.   pantoprazole 40 MG tablet Commonly known as: PROTONIX TAKE 1 TABLET BY MOUTH TWICE A DAY   Repatha SureClick 140 MG/ML Soaj Generic drug: Evolocumab Inject 140 mg into the skin every 14 (fourteen) days.   Vitamin D 50 MCG (2000  UT) Caps Take 4,000 Units by mouth daily.  Follow-up Information     Laurann Montana, MD Follow up in 1 week(s).   Specialty: Family Medicine Contact information: [phone] [address] [place] [address] [phone]                  Time coordinating discharge: 39 minutes  Signed:  Brisa Auth  [facility] 07/28/2023, 4:20 PM

## 2023-07-28 NOTE — Progress Notes (Signed)
Piv dcd. Site unremarkable. Discharge instructions reviewed with patient and husband, both verbalized understanding of instructions.all belongings and paperwork with patient.

## 2023-07-28 NOTE — Progress Notes (Signed)
PROGRESS NOTE  Rachel Clark RKY:[id number] DOB: February 15, 1951 DOA: 07/25/2023 PCP: Laurann Montana, MD   LOS: 0 days   Brief narrative:  Rachel Clark is a 73 y.o. female with medical history significant of colonic resection, CAD status post heart catheterization, seizure, hyperlipidemia, hypothyroidism presented to the emergency with bright red blood per rectum.  Of note patient did have a colonoscopy 2 weeks ago when multiple polyps were removed.  Patient was holding her aspirin and Plavix until 3 days ago when she started taking it and started having rectal bleed.  In the ED, patient was noted to have a hemoglobin of 11.3 with mild hyponatremia at 132.  Patient was hemodynamically stable.  GI was consulted and the patient was admitted to hospital for further evaluation and treatment.   Assessment/Plan: Principal Problem:   Hematochezia Active Problems:   Coronary artery disease, post CABG 2007    Dyslipidemia   Hypothyroid  Hematochezia Acute blood loss anemia most likely secondary to post polypectomy bleed Colonoscopy with polypectomy 2 weeks back.  GI on board.  Hemoglobin had dropped from 14-11.3.  On clears. GI has seen the patient and at this time conservative treatment underway.  Will continue to monitor hemoglobin every 12.  Latest hemoglobin of 7.5 from previous 8.8 and 8.4.  Has not had bowel movement since yesterday.  Will continue to monitor off of antiplatelets.   History of CAD-will continue to hold aspirin, prasugrel continue Imdur   Hypothyroidism-continue levothyroxine   Cognitive impairment-continue Namenda, had some confusion overnight.  Will provide supportive care.   GERD-continue Protonix   Chronic pain-continue Cymbalta   Hypertension-continue amlodipine.  Blood pressure seems to be stable.   History of grand mal seizure-continue Tegretol, ethosuximide.  No active issues.  DVT prophylaxis: Place TED hose Start: 07/26/23 0017  Disposition: Likely  home in 1 to 2 days.  Status is: Observation  The patient will require care spanning > 2 midnights and should be moved to inpatient because: Post polypectomy bleed, continued monitoring, GI follow-up    Code Status:     Code Status: Full Code  Family Communication: None at bedside.  Spoke with the patient's husband on the phone and updated him about the clinical condition of the patient.  Consultants: GI  Procedures: None yet.  Anti-infectives:  None  Anti-infectives (From admission, onward)    None       Subjective: Today, patient was seen and examined at bedside.  Nursing staff reported that she was confused yesterday.  Has not had further bowel movements.  Patient denies any nausea, vomiting, abdominal pain.  Still on some clear liquids.  Objective: Vitals:   07/28/23 1035 07/28/23 1050  BP: 120/64 (!) 114/49  Pulse: 68 69  Resp: 14   Temp: 97.7 F (36.5 C)   SpO2: 97% 100%    Intake/Output Summary (Last 24 hours) at 07/28/2023 1149 Last data filed at 07/28/2023 1010 Gross per 24 hour  Intake 200 ml  Output --  Net 200 ml   Filed Weights   07/25/23 1618 07/25/23 2012  Weight: 78.5 kg 77.1 kg   Body mass index is 25.85 kg/m.   Physical Exam:  GENERAL: Patient is alert awake and Communicative not in obvious distress. HENT: Mild pallor noted pupils equally reactive to light. Oral mucosa is moist NECK: is supple, no gross swelling noted. CHEST: Clear to auscultation. No crackles or wheezes.  Diminished breath sounds bilaterally. CVS: S1 and S2 heard, no murmur. Regular rate and rhythm.  ABDOMEN: Soft, mild tenderness noted. EXTREMITIES: No edema. CNS: Cranial nerves are intact. No focal motor deficits. SKIN: warm and dry without rashes.  Data Review: I have personally reviewed the following laboratory data and studies,  CBC: Recent Labs  Lab 07/25/23 1624 07/26/23 0355 07/26/23 1252 07/26/23 1647 07/27/23 0518 07/27/23 1651 07/28/23 0413   WBC 6.6 9.5  --   --   --   --  6.3  NEUTROABS 4.5  --   --   --   --   --   --   HGB 11.3* 9.2* 8.4* 8.8* 7.5* 7.5* 7.7*  HCT 33.2* 27.9* 25.2* 25.7* 21.5* 21.6* 22.7*  MCV 89.5 91.5  --   --   --   --  91.2  PLT 394 341  --   --   --   --  296   Basic Metabolic Panel: Recent Labs  Lab 07/25/23 1624 07/27/23 0518  NA 132* 133*  K 4.1 3.5  CL 101 104  CO2 23 22  GLUCOSE 131* 119*  BUN 19 17  CREATININE 0.79 0.86  CALCIUM 8.4* 8.0*  MG  --  1.7   Liver Function Tests: Recent Labs  Lab 07/25/23 1624  AST 15  ALT 11  ALKPHOS 49  BILITOT 0.4  PROT 6.4*  ALBUMIN 3.1*   Recent Labs  Lab 07/25/23 1624  LIPASE 28   No results for input(s): "AMMONIA" in the last 168 hours. Cardiac Enzymes: No results for input(s): "CKTOTAL", "CKMB", "CKMBINDEX", "TROPONINI" in the last 168 hours. BNP (last 3 results) No results for input(s): "BNP" in the last 8760 hours.  ProBNP (last 3 results) No results for input(s): "PROBNP" in the last 8760 hours.  CBG: No results for input(s): "GLUCAP" in the last 168 hours. No results found for this or any previous visit (from the past 240 hours).   Studies: No results found.    Joycelyn Das, MD  [facility] 07/28/2023  If 7PM-7AM, please contact night-coverage

## 2023-07-28 NOTE — Anesthesia Procedure Notes (Signed)
Procedure Name: MAC Date/Time: 07/28/2023 9:55 AM  Performed by: Randon Goldsmith, CRNAPre-anesthesia Checklist: Patient identified, Emergency Drugs available, Suction available and Patient being monitored Patient Re-evaluated:Patient Re-evaluated prior to induction Oxygen Delivery Method: Simple face mask

## 2023-07-28 NOTE — Interval H&P Note (Signed)
History and Physical Interval Note:  07/28/2023 9:28 AM  Rachel Clark  has presented today for surgery, with the diagnosis of Rectal bleeding.  The various methods of treatment have been discussed with the patient and family. After consideration of risks, benefits and other options for treatment, the patient has consented to  Procedure(s): COLONOSCOPY WITH PROPOFOL (N/A) as a surgical intervention.  The patient's history has been reviewed, patient examined, no change in status, stable for surgery.  I have reviewed the patient's chart and labs.  Questions were answered to the patient's satisfaction.     Denny Lave

## 2023-07-28 NOTE — Brief Op Note (Signed)
07/28/2023  11:34 AM  PATIENT:  Rachel Clark  73 y.o. female  PRE-OPERATIVE DIAGNOSIS:  Rectal bleeding  POST-OPERATIVE DIAGNOSIS:  5x  Clip placement ascending colon  APC straight fire  PROCEDURE:  Procedure(s): COLONOSCOPY WITH PROPOFOL (N/A) HEMOSTASIS CLIP PLACEMENT HOT HEMOSTASIS (ARGON PLASMA COAGULATION/BICAP) (N/A)  SURGEON:  Surgeons and Role:    * Kathi Der, MD - Primary  Findings ---------- -Colonoscopy showed post polypectomy ulcer.  Likely source of bleeding.  Treated with 4 clips placement.  There was another area of blood clot on the previous polypectomy site.  Which was removed and 1 clip was placed.  Few small AVMs with contact oozing.  Treated with APC.  Recommendations ---------------------------- -Slowly advance diet as tolerated -Okay to discharge from GI standpoint later today. -Resume Effient after 3 days if no further bleeding. -GI will sign off.  Call us back if needed.  Kathi Der MD, FACP 07/28/2023, 11:35 AM  Contact #  [phone]

## 2023-07-28 NOTE — Transfer of Care (Signed)
Immediate Anesthesia Transfer of Care Note  Patient: Rachel Clark  Procedure(s) Performed: COLONOSCOPY WITH PROPOFOL HEMOSTASIS CLIP PLACEMENT HOT HEMOSTASIS (ARGON PLASMA COAGULATION/BICAP)  Patient Location: PACU  Anesthesia Type:MAC  Level of Consciousness: drowsy  Airway & Oxygen Therapy: Patient Spontanous Breathing  Post-op Assessment: Report given to RN and Post -op Vital signs reviewed and stable  Post vital signs: Reviewed and stable  Last Vitals:  Vitals Value Taken Time  BP 106/47   Temp    Pulse 80 07/28/23 1021  Resp 19   SpO2 99 % 07/28/23 1021  Vitals shown include unfiled device data.  Last Pain:  Vitals:   07/28/23 0929  TempSrc: Temporal  PainSc: 0-No pain         Complications: No notable events documented.

## 2023-07-28 NOTE — Anesthesia Postprocedure Evaluation (Signed)
Anesthesia Post Note  Patient: Rachel Clark  Procedure(s) Performed: COLONOSCOPY WITH PROPOFOL HEMOSTASIS CLIP PLACEMENT HOT HEMOSTASIS (ARGON PLASMA COAGULATION/BICAP)     Patient location during evaluation: Endoscopy Anesthesia Type: MAC Level of consciousness: oriented, awake and alert and awake Pain management: pain level controlled Vital Signs Assessment: post-procedure vital signs reviewed and stable Respiratory status: spontaneous breathing, nonlabored ventilation and respiratory function stable Cardiovascular status: blood pressure returned to baseline and stable Postop Assessment: no headache, no backache and no apparent nausea or vomiting Anesthetic complications: no   No notable events documented.  Last Vitals:  Vitals:   07/28/23 1035 07/28/23 1050  BP: 120/64 (!) 114/49  Pulse: 68 69  Resp: 14   Temp: 36.5 C   SpO2: 97% 100%    Last Pain:  Vitals:   07/28/23 1100  TempSrc:   PainSc: 0-No pain                 Collene Schlichter

## 2023-07-28 NOTE — Op Note (Signed)
[facility] Patient Name: Rachel Clark Procedure Date : 07/28/2023 MRN: [id number] Attending MD: Kathi Der , MD, [id number] Date of Birth: Dec 04, 1950 CSN: [id number] Age: 73 Admit Type: Inpatient Procedure:                Colonoscopy Indications:              Rectal bleeding, Treatment of bleeding from                            polypectomy site Providers:                Kathi Der, MD, Eliberto Ivory, RN, Marja Kays, Technician Referring MD:              Medicines:                Sedation Administered by an Anesthesia Professional Complications:            No immediate complications. Estimated Blood Loss:     Estimated blood loss was minimal. Procedure:                Pre-Anesthesia Assessment:                           - Prior to the procedure, a History and Physical                            was performed, and patient medications and                            allergies were reviewed. The patient's tolerance of                            previous anesthesia was also reviewed. The risks                            and benefits of the procedure and the sedation                            options and risks were discussed with the patient.                            All questions were answered, and informed consent                            was obtained. Prior Anticoagulants: The patient has                            taken Effient (prasugrel), last dose was 2 days                            prior to procedure. ASA Grade Assessment: III - A  patient with severe systemic disease. After                            reviewing the risks and benefits, the patient was                            deemed in satisfactory condition to undergo the                            procedure.                           After obtaining informed consent, the colonoscope                            was passed under direct  vision. Throughout the                            procedure, the patient's blood pressure, pulse, and                            oxygen saturations were monitored continuously. The                            [id number]-HQ190TL ([id number]) Olympus peds colonoscope was                            introduced through the anus and advanced to the the                            cecum, identified by appendiceal orifice and                            ileocecal valve. The colonoscopy was performed                            without difficulty. The patient tolerated the                            procedure well. The quality of the bowel                            preparation was adequate to identify polyps greater                            than 5 mm in size. The ileocecal valve, appendiceal                            orifice, and rectum were photographed. Scope In: 9:53:05 AM Scope Out: 10:13:55 AM Scope Withdrawal Time: 0 hours 16 minutes 20 seconds  Total Procedure Duration: 0 hours 20 minutes 50 seconds  Findings:      Skin tags were found on perianal exam.      A tattoo was seen in the proximal ascending colon.  A single (solitary) six mm ulcer was found in the proximal ascending       colon.Likely post polypectomy site. Stigmata of recent bleeding were       present. For hemostasis, four hemostatic clips were successfully placed.       There was no bleeding at the end of the procedure.      Three small localized angioectasias with bleeding on contact were found       in the proximal ascending colon and in the cecum. Fulguration to ablate       the lesion to prevent bleeding by argon plasma was successful.      Clotted blood was found in the proximal ascending colon. For hemostasis,       one hemostatic clip was successfully placed. There was no bleeding at       the end of the procedure.      Four sessile polyps were found in the transverse colon and ascending       colon. The polyps were small  in size. Polypectomy was not attempted due       to the patient taking anticoagulation medication.      A few diverticula were found in the sigmoid colon.      Internal hemorrhoids were found during retroflexion. The hemorrhoids       were small. Impression:               - Perianal skin tags found on perianal exam.                           - A tattoo was seen in the proximal ascending colon.                           - A single (solitary) ulcer in the proximal                            ascending colon. Clips were placed.                           - Three colonic angioectasias. Treated with argon                            plasma coagulation (APC).                           - Blood in the proximal ascending colon. Clip was                            placed.                           - Four small polyps in the transverse colon and in                            the ascending colon. Resection not attempted.                           - Diverticulosis in the sigmoid colon.                           -  Internal hemorrhoids.                           - No specimens collected. Recommendation:           - Return patient to hospital ward for ongoing care.                           - Resume previous diet.                           - Continue present medications.                           - Resume Effient (prasugrel) at prior dose in 3                            days. Procedure Code(s):        --- Professional ---                           [phone], Colonoscopy, flexible; with control of                            bleeding, any method Diagnosis Code(s):        --- Professional ---                           K63.3, Ulcer of intestine                           K55.20, Angiodysplasia of colon without hemorrhage                           K92.2, Gastrointestinal hemorrhage, unspecified                           D12.3, Benign neoplasm of transverse colon (hepatic                            flexure or  splenic flexure)                           D12.2, Benign neoplasm of ascending colon                           K64.8, Other hemorrhoids                           K64.4, Residual hemorrhoidal skin tags                           K62.5, Hemorrhage of anus and rectum                           K91.840, Postprocedural hemorrhage of a digestive  system organ or structure following a digestive                            system procedure                           K57.30, Diverticulosis of large intestine without                            perforation or abscess without bleeding CPT copyright 2022 [employer]. All rights reserved. The codes documented in this report are preliminary and upon coder review may  be revised to meet current compliance requirements. Kathi Der, MD Kathi Der, MD 07/28/2023 10:32:15 AM Number of Addenda: 0

## 2023-07-28 NOTE — Anesthesia Preprocedure Evaluation (Signed)
Anesthesia Evaluation  Patient identified by MRN, date of birth, ID band Patient awake    Reviewed: Allergy & Precautions, NPO status , Patient's Chart, lab work & pertinent test results  Airway Mallampati: II  TM Distance: >3 FB Neck ROM: Full    Dental  (+) Teeth Intact, Dental Advisory Given   Pulmonary former smoker   Pulmonary exam normal breath sounds clear to auscultation       Cardiovascular hypertension, Pt. on medications + angina  + CAD, + Past MI, + Cardiac Stents and + CABG  Normal cardiovascular exam+ dysrhythmias + pacemaker  Rhythm:Regular Rate:Normal     Neuro/Psych  Headaches, Seizures -, Well Controlled,  PSYCHIATRIC DISORDERS  Depression       GI/Hepatic Neg liver ROS, PUD,GERD  Medicated,,Rectal bleeding    Endo/Other  Hypothyroidism    Renal/GU negative Renal ROS     Musculoskeletal  (+) Arthritis ,  Fibromyalgia -  Abdominal   Peds  Hematology  (+) Blood dyscrasia (Effient), anemia   Anesthesia Other Findings Day of surgery medications reviewed with the patient.  Reproductive/Obstetrics                              Anesthesia Physical Anesthesia Plan  ASA: 3  Anesthesia Plan: MAC   Post-op Pain Management: Minimal or no pain anticipated   Induction: Intravenous  PONV Risk Score and Plan: 2  Airway Management Planned: Natural Airway and Simple Face Mask  Additional Equipment:   Intra-op Plan:   Post-operative Plan:   Informed Consent: I have reviewed the patients History and Physical, chart, labs and discussed the procedure including the risks, benefits and alternatives for the proposed anesthesia with the patient or authorized representative who has indicated his/her understanding and acceptance.     Dental advisory given  Plan Discussed with: CRNA  Anesthesia Plan Comments:          Anesthesia Quick Evaluation

## 2023-07-28 NOTE — Progress Notes (Signed)
   07/28/23 1050  Vitals  BP (!) 114/49  MAP (mmHg) 70  BP Location Right Arm  BP Method Automatic  Patient Position (if appropriate) Lying  Pulse Rate 69  Pulse Rate Source Monitor  MEWS COLOR  MEWS Score Color Green  Oxygen Therapy  SpO2 100 %  O2 Device Room Air  MEWS Score  MEWS Temp 0  MEWS Systolic 0  MEWS Pulse 0  MEWS RR 0  MEWS LOC 0  MEWS Score 0   Received from PACU

## 2023-07-28 NOTE — Plan of Care (Signed)
  Problem: Health Behavior/Discharge Planning: Goal: Ability to manage health-related needs will improve Outcome: Adequate for Discharge  Discharged to go home under the care of spouse

## 2023-07-30 ENCOUNTER — Encounter (HOSPITAL_COMMUNITY): Payer: Self-pay | Admitting: Gastroenterology

## 2023-07-30 NOTE — Telephone Encounter (Signed)
Called patient's husband left message on personal voice mail Dr.Jordan reviewed wife's chart.He advised care appropriate.

## 2023-08-02 DIAGNOSIS — Z8719 Personal history of other diseases of the digestive system: Secondary | ICD-10-CM | POA: Diagnosis not present

## 2023-08-15 DIAGNOSIS — E871 Hypo-osmolality and hyponatremia: Secondary | ICD-10-CM | POA: Diagnosis not present

## 2023-08-15 DIAGNOSIS — K922 Gastrointestinal hemorrhage, unspecified: Secondary | ICD-10-CM | POA: Diagnosis not present

## 2023-08-15 DIAGNOSIS — D649 Anemia, unspecified: Secondary | ICD-10-CM | POA: Diagnosis not present

## 2023-08-20 ENCOUNTER — Telehealth: Payer: Self-pay | Admitting: Cardiology

## 2023-08-20 ENCOUNTER — Ambulatory Visit: Payer: Medicare Other | Admitting: Diagnostic Neuroimaging

## 2023-08-20 NOTE — Telephone Encounter (Signed)
Pt's husband would like to know if Dr. [place] has any recommendations for a provider whom specializes in Dementia and Alzheimer's. Please advise

## 2023-08-20 NOTE — Telephone Encounter (Signed)
Spoke to patient he stated he would like Dr.Jordan's advice on a Dr.who specializes in Alzheimer's.Stated wife is seeing a neurologist but they would like a second opinion.I will send message to him for advice.

## 2023-08-20 NOTE — Telephone Encounter (Signed)
Called patient's husband left Dr.Jordan's advice on personal voice mail.

## 2023-08-21 ENCOUNTER — Encounter (HOSPITAL_COMMUNITY): Payer: Medicare Other

## 2023-08-21 DIAGNOSIS — I495 Sick sinus syndrome: Secondary | ICD-10-CM

## 2023-08-23 LAB — CUP PACEART REMOTE DEVICE CHECK
Battery Remaining Longevity: 37 mo
Battery Voltage: 2.96 V
Brady Statistic AP VP Percent: 0.34 %
Brady Statistic AP VS Percent: 6.03 %
Brady Statistic AS VP Percent: 0.71 %
Brady Statistic AS VS Percent: 92.92 %
Brady Statistic RA Percent Paced: 6.2 %
Brady Statistic RV Percent Paced: 0.89 %
Date Time Interrogation Session: 20250219131804
Implantable Lead Connection Status: 753985
Implantable Lead Connection Status: 753985
Implantable Lead Implant Date: 20160922
Implantable Lead Implant Date: 20160922
Implantable Lead Location: 753859
Implantable Lead Location: 753860
Implantable Lead Model: 5076
Implantable Lead Model: 5076
Implantable Pulse Generator Implant Date: 20160922
Lead Channel Impedance Value: 1349 Ohm
Lead Channel Impedance Value: 1368 Ohm
Lead Channel Impedance Value: 399 Ohm
Lead Channel Impedance Value: 456 Ohm
Lead Channel Pacing Threshold Amplitude: 0.875 V
Lead Channel Pacing Threshold Amplitude: 1.5 V
Lead Channel Pacing Threshold Pulse Width: 0.4 ms
Lead Channel Pacing Threshold Pulse Width: 0.4 ms
Lead Channel Sensing Intrinsic Amplitude: 2.375 mV
Lead Channel Sensing Intrinsic Amplitude: 2.375 mV
Lead Channel Sensing Intrinsic Amplitude: 9.25 mV
Lead Channel Sensing Intrinsic Amplitude: 9.25 mV
Lead Channel Setting Pacing Amplitude: 2.5 V
Lead Channel Setting Pacing Amplitude: 2.5 V
Lead Channel Setting Pacing Pulse Width: 0.4 ms
Lead Channel Setting Sensing Sensitivity: 2.8 mV
Zone Setting Status: 755011
Zone Setting Status: 755011

## 2023-08-29 DIAGNOSIS — Z1231 Encounter for screening mammogram for malignant neoplasm of breast: Secondary | ICD-10-CM | POA: Diagnosis not present

## 2023-08-31 ENCOUNTER — Other Ambulatory Visit: Payer: Self-pay

## 2023-08-31 ENCOUNTER — Emergency Department (HOSPITAL_COMMUNITY): Payer: Medicare Other

## 2023-08-31 ENCOUNTER — Telehealth: Payer: Self-pay | Admitting: Neurology

## 2023-08-31 ENCOUNTER — Encounter (HOSPITAL_COMMUNITY): Payer: Self-pay

## 2023-08-31 ENCOUNTER — Telehealth: Payer: Self-pay | Admitting: Internal Medicine

## 2023-08-31 ENCOUNTER — Emergency Department (HOSPITAL_COMMUNITY)
Admission: EM | Admit: 2023-08-31 | Discharge: 2023-09-01 | Disposition: A | Discharge status: Home / Self Care | Payer: Medicare Other | Attending: Emergency Medicine | Admitting: Emergency Medicine

## 2023-08-31 DIAGNOSIS — N390 Urinary tract infection, site not specified: Secondary | ICD-10-CM | POA: Diagnosis not present

## 2023-08-31 DIAGNOSIS — Z7982 Long term (current) use of aspirin: Secondary | ICD-10-CM | POA: Insufficient documentation

## 2023-08-31 DIAGNOSIS — I2510 Atherosclerotic heart disease of native coronary artery without angina pectoris: Secondary | ICD-10-CM | POA: Insufficient documentation

## 2023-08-31 DIAGNOSIS — N3000 Acute cystitis without hematuria: Secondary | ICD-10-CM | POA: Insufficient documentation

## 2023-08-31 DIAGNOSIS — I672 Cerebral atherosclerosis: Secondary | ICD-10-CM | POA: Diagnosis not present

## 2023-08-31 DIAGNOSIS — G40822 Epileptic spasms, not intractable, without status epilepticus: Secondary | ICD-10-CM | POA: Diagnosis present

## 2023-08-31 DIAGNOSIS — W19XXXA Unspecified fall, initial encounter: Secondary | ICD-10-CM | POA: Diagnosis not present

## 2023-08-31 DIAGNOSIS — I6782 Cerebral ischemia: Secondary | ICD-10-CM | POA: Diagnosis not present

## 2023-08-31 DIAGNOSIS — G40909 Epilepsy, unspecified, not intractable, without status epilepticus: Secondary | ICD-10-CM | POA: Diagnosis not present

## 2023-08-31 DIAGNOSIS — Z91148 Patient's other noncompliance with medication regimen for other reason: Secondary | ICD-10-CM

## 2023-08-31 DIAGNOSIS — G9389 Other specified disorders of brain: Secondary | ICD-10-CM | POA: Diagnosis not present

## 2023-08-31 DIAGNOSIS — G253 Myoclonus: Secondary | ICD-10-CM | POA: Diagnosis not present

## 2023-08-31 DIAGNOSIS — I10 Essential (primary) hypertension: Secondary | ICD-10-CM | POA: Diagnosis not present

## 2023-08-31 LAB — CBC WITH DIFFERENTIAL/PLATELET
Abs Immature Granulocytes: 0.03 10*3/uL (ref 0.00–0.07)
Basophils Absolute: 0.1 10*3/uL (ref 0.0–0.1)
Basophils Relative: 1 %
Eosinophils Absolute: 0.2 10*3/uL (ref 0.0–0.5)
Eosinophils Relative: 2 %
HCT: 28.9 % — ABNORMAL LOW (ref 36.0–46.0)
Hemoglobin: 9 g/dL — ABNORMAL LOW (ref 12.0–15.0)
Immature Granulocytes: 0 %
Lymphocytes Relative: 11 %
Lymphs Abs: 1 10*3/uL (ref 0.7–4.0)
MCH: 25.9 pg — ABNORMAL LOW (ref 26.0–34.0)
MCHC: 31.1 g/dL (ref 30.0–36.0)
MCV: 83 fL (ref 80.0–100.0)
Monocytes Absolute: 0.8 10*3/uL (ref 0.1–1.0)
Monocytes Relative: 10 %
Neutro Abs: 6.5 10*3/uL (ref 1.7–7.7)
Neutrophils Relative %: 76 %
Platelets: 575 10*3/uL — ABNORMAL HIGH (ref 150–400)
RBC: 3.48 MIL/uL — ABNORMAL LOW (ref 3.87–5.11)
RDW: 16 % — ABNORMAL HIGH (ref 11.5–15.5)
WBC: 8.6 10*3/uL (ref 4.0–10.5)
nRBC: 0 % (ref 0.0–0.2)

## 2023-08-31 LAB — COMPREHENSIVE METABOLIC PANEL
ALT: 11 U/L (ref 0–44)
AST: 15 U/L (ref 15–41)
Albumin: 3.5 g/dL (ref 3.5–5.0)
Alkaline Phosphatase: 79 U/L (ref 38–126)
Anion gap: 10 (ref 5–15)
BUN: 20 mg/dL (ref 8–23)
CO2: 25 mmol/L (ref 22–32)
Calcium: 8.6 mg/dL — ABNORMAL LOW (ref 8.9–10.3)
Chloride: 103 mmol/L (ref 98–111)
Creatinine, Ser: 0.86 mg/dL (ref 0.44–1.00)
GFR, Estimated: >60 mL/min (ref >60)
Glucose, Bld: 91 mg/dL (ref 70–99)
Potassium: 4.4 mmol/L (ref 3.5–5.1)
Sodium: 138 mmol/L (ref 135–145)
Total Bilirubin: 0.2 mg/dL (ref 0.0–1.2)
Total Protein: 7.2 g/dL (ref 6.5–8.1)

## 2023-08-31 LAB — CARBAMAZEPINE LEVEL, TOTAL: Carbamazepine Lvl: 4.3 ug/mL (ref 4.0–12.0)

## 2023-08-31 MED ORDER — ETHOSUXIMIDE 250 MG PO CAPS
500.0000 mg | ORAL_CAPSULE | Freq: Two times a day (BID) | ORAL | Status: AC
  Administered 2023-08-31: 500 mg via ORAL
  Filled 2023-08-31: qty 2

## 2023-08-31 NOTE — Telephone Encounter (Signed)
 Spoke with pt's husband, Fayrene Fearing, per DPR. Explained that the prescription for Ranexa for his wife had been discontinued on 02/08/23 by Dr. Allyson Sabal after her stay at the hospital and that he is correct in not giving the medication. He verbalized understanding and said he would dispose of the medication. All questions, if any, were answered.

## 2023-08-31 NOTE — ED Provider Triage Note (Signed)
 Emergency Medicine Provider Triage Evaluation Note  Rachel Clark , a 73 y.o. female  was evaluated in triage.  Pt complains of seizure-like activity.  She reports that when she is under stress she will sometimes have seizures.  She has a longstanding history of epilepsy but has not had a typical seizure for a long time.  Husband had reported and a phone call to neurology today that she had had a full body jerking this morning.  She reports that she is here for seizure and is not sure if it is because she is under stress.  She denies any other acute illness, no fever, cough, congestion, nausea, vomiting, diarrhea.  Reports has been taking her seizure medications as prescribed.  No new numbness, weakness, change in vision, difficulty talking or walking. She does have some facial twitching.   Review of Systems  Positive: seizure Negative: See above  Physical Exam  BP (!) 158/63 (BP Location: Right Arm)   Pulse 90   Temp (!) 97.5 F (36.4 C) (Oral)   Resp 16   SpO2 100%  Gen:   Awake, no distress   Resp:  Normal effort  MSK:   Moves extremities without difficulty  Other:  Normal neurologic exam with exception of moments of speech hesitancy, facial twitching  Medical Decision Making  Medically screening exam initiated at 9:24 PM.  Appropriate orders placed.  WESTYN KEATLEY was informed that the remainder of the evaluation will be completed by another provider, this initial triage assessment does not replace that evaluation, and the importance of remaining in the ED until their evaluation is complete.  Placed orders, seizure levels, will obtain EEG given facial twitch/ ?focal seizure or tic?   Alvira Monday, MD 08/31/23 2245

## 2023-08-31 NOTE — Telephone Encounter (Signed)
 Pt c/o medication issue:  1. Name of Medication:   ranolazine (RANEXA) 500 MG 12 hr tablet   2. How are you currently taking this medication (dosage and times per day)?   Not taking  3. Are you having a reaction (difficulty breathing--STAT)?   4. What is your medication issue?   Husband Fayrene Fearing) called to confirm patient should not be taking this medication.  Husband noted patient still has this medication.

## 2023-08-31 NOTE — ED Notes (Signed)
 Pt unable to urinate at this time.

## 2023-08-31 NOTE — ED Provider Notes (Signed)
 [facility] AT [facility] Provider Note   CSN: [id number] Arrival date & time: 08/31/23  2100     History  Chief Complaint  Patient presents with   Rachel Clark    Rachel Clark is a 73 y.o. female.  73 y/o female with hx of CAD s/p CABG, HLD, CHB s/p PPM, colon cancer (remission), and epilepsy (last seizure 52 years ago) presents to the ED for episodic spasms and twitches. Husband states that symptoms began around 0200 today (08/31/23) and have persisted throughout the day. Describes episodes as "full body hiccups". Involuntary movements causing patient to drop items, for instance she doused herself with a bottle of water she was holding this afternoon. Had a mechanical fall this afternoon where she slid to the ground. No head injury or pain 2/2 fall, but EMS was called and encouraged transport to the ED. Patient has been largely compliant with her prescribed medications, but husband states that he realized today that patient has been out of her Zarontin for an unknown length of time. No fevers, no numbness or weakness, vision changes, difficulty speaking or swallowing, facial drooping, chest pain, shortness of breath.  The history is provided by the patient and the spouse. No language interpreter was used.  Fall       Home Medications Prior to Admission medications   Medication Sig Start Date End Date Taking? Authorizing Provider  cephALEXin (KEFLEX) 500 MG capsule Take 1 capsule (500 mg total) by mouth 2 (two) times daily for 7 days. 09/01/23 09/08/23 Yes Antony Madura, PA-C  acetaminophen (TYLENOL) 500 MG tablet Take 500-1,000 mg by mouth daily as needed for mild pain or headache.    [provider]  amLODipine (NORVASC) 5 MG tablet TAKE 1 TABLET (5 MG TOTAL) BY MOUTH DAILY. 05/14/23   Arty Baumgartner, NP  aspirin EC 81 MG tablet Take 1 tablet (81 mg total) by mouth daily. Swallow whole. 04/26/23   [place], Peter M, MD  Bempedoic Acid-Ezetimibe  (NEXLIZET) 180-10 MG TABS Take 180 mg by mouth daily. 04/25/22   [place], Peter M, MD  calcium carbonate (TUMS - DOSED IN MG ELEMENTAL CALCIUM) 500 MG chewable tablet Chew 500 mg by mouth daily.    [provider]  carbamazepine (TEGRETOL XR) 200 MG 12 hr tablet Take 3 tablets (600 mg total) by mouth 2 (two) times daily. 04/26/23 07/19/24  Glean Salvo, NP  Cholecalciferol (VITAMIN D) 50 MCG (2000 UT) CAPS Take 4,000 Units by mouth daily.    [provider]  diphenhydrAMINE (BENADRYL) 25 MG tablet Take 25 mg by mouth daily as needed for allergies.    [provider]  diphenhydramine-acetaminophen (TYLENOL PM) 25-500 MG TABS tablet Take 1 tablet by mouth at bedtime as needed (Sleep).    [provider]  DULoxetine (CYMBALTA) 60 MG capsule Take 60 mg by mouth 2 (two) times daily.    [provider]  ethosuximide (ZARONTIN) 250 MG capsule Take 2 capsules (500 mg total) by mouth 2 (two) times daily. TAKE 2 CAPSULES IN THE MORNING, TAKE 2 CAPSULES AT NIGHT 04/26/23   Glean Salvo, NP  Evolocumab (REPATHA SURECLICK) 140 MG/ML SOAJ Inject 140 mg into the skin every 14 (fourteen) days. 12/25/22   [place], Peter M, MD  fluticasone [facility]) 50 MCG/ACT nasal spray Place 1 spray into both nostrils daily as needed for allergies.    [provider]  isosorbide mononitrate (IMDUR) 30 MG 24 hr tablet Take one-half tablet by  mouth every day 04/17/23   [place], Peter M, MD  levothyroxine (SYNTHROID, LEVOTHROID) 50 MCG tablet Take 50 mcg by mouth daily before breakfast.    [provider]  memantine (NAMENDA) 10 MG tablet Take 1 tablet (10 mg total) by mouth 2 (two) times daily. 04/26/23   Glean Salvo, NP  nitroGLYCERIN (NITROSTAT) 0.4 MG SL tablet place 1 tablet under the tongue every 5 minutes as needed for chest pain 02/16/23   [place], Peter M, MD  nystatin (MYCOSTATIN/NYSTOP) powder Apply 1 Application topically 3 (three) times daily. 07/17/23    [provider]  pantoprazole (PROTONIX) 40 MG tablet TAKE 1 TABLET BY MOUTH TWICE A DAY 05/08/23   [place], Peter M, MD  prasugrel (EFFIENT) 10 MG TABS tablet Take 1 tablet (10 mg total) by mouth daily. 04/25/22   [place], Peter M, MD      Allergies    Sulfamethoxazole, Aspirin, Crestor [rosuvastatin], Erythromycin, Lisinopril, Niacin and related, Penicillin g benzathine, Statins, Sulfa drugs cross reactors, Tetracyclines & related, Xeloda [capecitabine], Penicillins, and Plavix [clopidogrel bisulfate]    Review of Systems   Review of Systems Ten systems reviewed and are negative for acute change, except as noted in the HPI.    Physical Exam Updated Vital Signs BP (!) 130/53   Pulse 88   Temp 97.8 F (36.6 C) (Oral)   Resp 16   Ht 5\' 8"  (1.727 m)   Wt 77 kg   SpO2 99%   BMI 25.81 kg/m   Physical Exam Vitals and nursing note reviewed.  Constitutional:      General: She is not in acute distress.    Appearance: She is well-developed. She is not diaphoretic.     Comments: Alert and well appearing.  HENT:     Head: Normocephalic and atraumatic.     Right Ear: External ear normal.     Left Ear: External ear normal.  Eyes:     General: No scleral icterus.    Conjunctiva/sclera: Conjunctivae normal.  Pulmonary:     Effort: Pulmonary effort is normal. No respiratory distress.     Comments: Respirations even and unlabored Musculoskeletal:        General: Normal range of motion.     Cervical back: Normal range of motion.  Skin:    General: Skin is warm and dry.     Coloration: Skin is not pale.     Findings: No erythema or rash.  Neurological:     Mental Status: She is alert and oriented to person, place, and time.     Coordination: Coordination normal.     Comments: Involuntary muscle jerks spontaneously noted to the mouth/lower face and BUE; mild. No focal deficits noted on exam. Moving all extremities spontaneously. No tremor. Gait not assessed.  Psychiatric:         Behavior: Behavior normal.     ED Results / Procedures / Treatments   Labs (all labs ordered are listed, but only abnormal results are displayed) Labs Reviewed  CBC WITH DIFFERENTIAL/PLATELET - Abnormal; Notable for the following components:      Result Value   RBC 3.48 (*)    Hemoglobin 9.0 (*)    HCT 28.9 (*)    MCH 25.9 (*)    RDW 16.0 (*)    Platelets 575 (*)    All other components within normal limits  COMPREHENSIVE METABOLIC PANEL - Abnormal; Notable for the following components:   Calcium 8.6 (*)    All other components within  normal limits  URINALYSIS, W/ REFLEX TO CULTURE (INFECTION SUSPECTED) - Abnormal; Notable for the following components:   APPearance HAZY (*)    Nitrite POSITIVE (*)    Leukocytes,Ua LARGE (*)    Bacteria, UA MANY (*)    All other components within normal limits  RESP PANEL BY RT-PCR (RSV, FLU A&B, COVID)  [id number]  URINE CULTURE  CARBAMAZEPINE LEVEL, TOTAL  AMMONIA  ETHOSUXIMIDE LEVEL    EKG None  Radiology CT Head Wo Contrast Result Date: 08/31/2023 CLINICAL DATA:  Seizure disorder EXAM: CT HEAD WITHOUT CONTRAST TECHNIQUE: Contiguous axial images were obtained from the base of the skull through the vertex without intravenous contrast. RADIATION DOSE REDUCTION: This exam was performed according to the departmental dose-optimization program which includes automated exposure control, adjustment of the mA and/or kV according to patient size and/or use of iterative reconstruction technique. COMPARISON:  CT brain 10/09/2022 FINDINGS: Brain: No acute territorial infarction, hemorrhage or intracranial mass. Stable encephalomalacia in the left temporal lobe. Atrophy and chronic small vessel ischemic changes of the white matter. Stable ventricle size Vascular: No hyperdense vessels. Vertebral and carotid vascular calcification Skull: Normal. Negative for fracture or focal lesion. Sinuses/Orbits: No acute finding. Other: None IMPRESSION: 1. No CT evidence  for acute intracranial abnormality. 2. Atrophy and chronic small vessel ischemic changes of the white matter. Stable left temporal lobe encephalomalacia Electronically Signed   By: Jasmine Pang M.D.   On: 08/31/2023 23:31    Procedures Procedures    Medications Ordered in ED Medications  ethosuximide (ZARONTIN) capsule 500 mg (500 mg Oral Given 08/31/23 2348)  cephALEXin (KEFLEX) capsule 500 mg (500 mg Oral Given 09/01/23 0141)    ED Course/ Medical Decision Making/ A&P Clinical Course as of 09/01/23 0221  Fri Aug 31, 2023  2320 Spoke with pharmacist about restarting Zarontin. pharmacy states no need to titrate or load; able to restart at prescribed dose. [KH]  2345 Unable to complete ordered EEG as techs have gone home for the evening. Spoke with Dr. Derry Lory regarding holding patient for completion of this in AM. Given that symptoms are more c/w involuntary muscle spasms/jerking rather than focal seizure activity, Neurology does not see indication for prolonged ED observation or admission and urgent/emergent completion of EEG. Agree that component of symptoms may be related to medication noncompliance. Request LFTs and ammonia be checked. These have been ordered. [KH]  2347 CT without acute abnormality; stable encephalomalacia.  [KH]  Sat Sep 01, 2023  0130 Patient does appear to have a UTI. Will start on Keflex, send urine culture.  [KH]    Clinical Course User Index [KH] Antony Madura, PA-C                                 Medical Decision Making Amount and/or Complexity of Data Reviewed Labs: ordered. Radiology: ordered.  Risk Prescription drug management.   This patient presents to the ED for concern of involuntary muscle movements, this involves an extensive number of treatment options, and is a complaint that carries with it a high risk of complications and morbidity.  The differential diagnosis includes myoclonic jerks vs focal seizure vs electrolyte derangement vs  mediation toxicity vs PNES   Co morbidities that complicate the patient evaluation  CAD HLD CHB s/p PPM Epilepsy    Additional history obtained:  Additional history obtained from spouse, at bedside External records from outside source obtained and reviewed including normal EEG  from April 2024.   Lab Tests:  I Ordered, and personally interpreted labs.  The pertinent results include:  Hgb 9.0 (improved from prior), no electrolyte derangements. UA c/w UTI.  Carbamazepine level therapeutic.  Normal ammonia level.  Negative viral panel.   Imaging Studies ordered:  I ordered imaging studies including CT head  I independently visualized and interpreted imaging which showed no acute intracranial abnormality. I agree with the radiologist interpretation   Cardiac Monitoring:  The patient was maintained on a cardiac monitor.  I personally viewed and interpreted the cardiac monitored which showed an underlying rhythm of: NSR   Medicines ordered and prescription drug management:  I ordered medication including Zarontin for medication compliance. Keflex given for tx of UTI  Reevaluation of the patient after these medicines showed that the patient  remained stable I have reviewed the patients home medicines and have made adjustments as needed   Test Considered:  EEG   Consultations Obtained:  I requested consultation with Dr. Derry Lory of Neurology and discussed lab and imaging findings as well as pertinent plan - they recommend: LFTs and ammonia level as well as resuming patient's Zarontin.   Problem List / ED Course:  As above   Reevaluation:  After the interventions noted above, I reevaluated the patient and found that they have :stayed the same   Social Determinants of Health:  Ambulatory with assist device   Dispostion:  After consideration of the diagnostic results and the patients response to treatment, I feel that the patent would benefit from resuming  Zarontin Rx. Will also d/c on course of Keflex for UTI. Encouraged PCP and Neurology reassessment. Return precautions discussed and provided. Patient discharged in stable condition with no unaddressed concerns.          Final Clinical Impression(s) / ED Diagnoses Final diagnoses:  Acute cystitis without hematuria  Myoclonic jerking  Noncompliance with medications    Rx / DC Orders ED Discharge Orders          Ordered    cephALEXin (KEFLEX) 500 MG capsule  2 times daily        09/01/23 0139              Antony Madura, PA-C 09/01/23 0226    Sabas Sous, MD 09/01/23 [phone]

## 2023-08-31 NOTE — Telephone Encounter (Signed)
 Pt is requesting a refill for memantine (NAMENDA) 10 MG tablet .  Pharmacy: [facility] # [phone]

## 2023-08-31 NOTE — Telephone Encounter (Signed)
 Patient's husband, Rosanne Ashing, called with an after-hours call message reporting that patient had jerking.  I was able to connect with patient's husband on the phone and he reported that she had full body jerking this morning, she does have a longstanding history of epilepsy but per his report has not had a seizure like her typical seizure for over 50 years.  He reported that EMS came out and her vitals were stable.  EMS reportedly recommended not to take her to the emergency room.  He was reluctant to take her to the emergency room but I suggested that he take her to the ER if she has an acute change in her functioning and if he is concerned about worsening twitching.  He reported that her twitching has become less since earlier in AM.  He would like a note to be sent to Dr. Marjory Lies which I reassured him I would do.  He is advised to call 911 or take her to the emergency room if she has an acute worsening of her functional status or if he has any acute concerns about changes in her motor function.  He demonstrated understanding and voiced agreement.

## 2023-08-31 NOTE — ED Notes (Signed)
 Patient to CT.

## 2023-08-31 NOTE — Telephone Encounter (Signed)
 Called pt's husband and let him that pt still has 4 refills left of her Memantine 10mg  in her [employer].   Pt's husband is going to call [facility] to have them refill pt's medication.

## 2023-08-31 NOTE — ED Triage Notes (Signed)
 Pt is coming by medic from home, she had a fall with no injuries, She was walking to the bathroom and fell, it was a mechaincval witnessed fall. She does have a strong urine ordor indicative of a UTI. No physical pain after the fall, she does not express many signs of a UTI. No fevers at home, no confusion. Demented at baseline, Ax3 is normal   Medic vitals  126/62 95hr 98%ra 164bgl

## 2023-08-31 NOTE — ED Notes (Signed)
 Pt unable to void at this time.

## 2023-09-01 DIAGNOSIS — N3000 Acute cystitis without hematuria: Secondary | ICD-10-CM | POA: Diagnosis not present

## 2023-09-01 LAB — URINALYSIS, W/ REFLEX TO CULTURE (INFECTION SUSPECTED)
Bilirubin Urine: NEGATIVE
Glucose, UA: NEGATIVE mg/dL
Hgb urine dipstick: NEGATIVE
Ketones, ur: NEGATIVE mg/dL
Nitrite: POSITIVE — AB
Protein, ur: NEGATIVE mg/dL
Specific Gravity, Urine: 1.018 (ref 1.005–1.030)
WBC, UA: >50 WBC/hpf (ref 0–5)
pH: 5 (ref 5.0–8.0)

## 2023-09-01 LAB — RESP PANEL BY RT-PCR (RSV, FLU A&B, COVID)  RVPGX2
Influenza A by PCR: NEGATIVE
Influenza B by PCR: NEGATIVE
Resp Syncytial Virus by PCR: NEGATIVE
SARS Coronavirus 2 by RT PCR: NEGATIVE

## 2023-09-01 LAB — AMMONIA: Ammonia: <10 umol/L (ref 9–35)

## 2023-09-01 MED ORDER — CEPHALEXIN 500 MG PO CAPS: 500.0000 mg | ORAL_CAPSULE | Freq: Two times a day (BID) | ORAL | 0 refills | Status: AC

## 2023-09-01 MED ORDER — CEPHALEXIN 250 MG PO CAPS
500.0000 mg | ORAL_CAPSULE | Freq: Once | ORAL | Status: AC
  Administered 2023-09-01: 500 mg via ORAL
  Filled 2023-09-01: qty 2

## 2023-09-01 NOTE — Discharge Instructions (Addendum)
 You are found to have a urinary tract infection today.  Take Keflex as prescribed until finished.  It is possible that your symptoms may be related to noncompliance with your Zarontin.  It is important that you pick up this prescription and resume taking it as prescribed.  Follow-up with your primary care doctor and/or neurologist.  You may return to the ED for new or concerning symptoms.

## 2023-09-01 NOTE — ED Notes (Signed)
 This RN assisted pt to BR, pt appears unsteady on her feet, pt has moments of "jerking" and uncontrolled movements to her limbs at different interval. Pt was able to use bathroom with this RN to help prevent falls, pt was escorted back to RM via wheelchair. Husband reports pt usually does not use an assist device at home for ambulating, but today was worse with her "jerking" and had fall at home as she was not able to ambulate.

## 2023-09-03 LAB — URINE CULTURE: Culture: >=100000 — AB

## 2023-09-03 LAB — ETHOSUXIMIDE LEVEL: Ethosuximide Lvl: NOT DETECTED ug/mL (ref 40–100)

## 2023-09-05 NOTE — Telephone Encounter (Signed)
 Contacted pt spouse to offer VV with MD 09/06/23 at 4pm. LVM rq a call back.

## 2023-09-06 ENCOUNTER — Encounter: Payer: Self-pay | Admitting: Diagnostic Neuroimaging

## 2023-09-06 ENCOUNTER — Ambulatory Visit: Admitting: Diagnostic Neuroimaging

## 2023-09-06 VITALS — BP 159/81 | HR 93 | Ht 67.0 in | Wt 165.0 lb

## 2023-09-06 DIAGNOSIS — R569 Unspecified convulsions: Secondary | ICD-10-CM

## 2023-09-06 DIAGNOSIS — G308 Other Alzheimer's disease: Secondary | ICD-10-CM

## 2023-09-06 DIAGNOSIS — F02B3 Dementia in other diseases classified elsewhere, moderate, with mood disturbance: Secondary | ICD-10-CM | POA: Diagnosis not present

## 2023-09-06 MED ORDER — MEMANTINE HCL 10 MG PO TABS: 10.0000 mg | ORAL_TABLET | Freq: Two times a day (BID) | ORAL | 4 refills | Status: DC

## 2023-09-06 MED ORDER — ETHOSUXIMIDE 250 MG PO CAPS: 500.0000 mg | ORAL_CAPSULE | Freq: Two times a day (BID) | ORAL | 4 refills | Status: DC

## 2023-09-06 MED ORDER — CARBAMAZEPINE ER 200 MG PO TB12: 600.0000 mg | ORAL_TABLET | Freq: Two times a day (BID) | ORAL | 4 refills | Status: DC

## 2023-09-06 NOTE — Telephone Encounter (Signed)
 Was able to offer in office visit at 3 pm today. Pt accepted.

## 2023-09-06 NOTE — Progress Notes (Signed)
 [facility]  PATIENT: Rachel Clark DOB: March 19, 1951  REFERRING CLINICIAN: Laurann Montana, MD HISTORY FROM: patient REASON FOR VISIT: follow up   HISTORICAL  CHIEF COMPLAINT:  Chief Complaint  Patient presents with   Seizures    Rm 7 with spouse  Pt is well, here to discuss recent sz like activity last week. Denies any episodes since.     HISTORY OF PRESENT ILLNESS:   UPDATE (09/06/23, VRP): Since last visit, doing well until 08/30/23, with worsening myoclonus. Went to ER and AED levels were low. Now back on meds and myoclonus has stopped. Memory loss continues. Somewhat sedentary.   UPDATE (11/06/22, VRP): Since last visit, doing better!. No more myoclonus. Memory loss continues.   UPDATE (09/25/22, VRP): Since last visit, stable until yesterday. New onset of confusion, muscle jerks. She did not reduce her CBZ dosing as per Rachel Clark recommendation on 09/19/22 phone call. 09/25/22. Some lightheadedness, dizziness.   UPDATE (09/13/21, VRP): Since last visit, doing well. Symptoms are stable. No more seizures. Memory loss stable. No major changes in ADLs. No more syncope attacks.   PRIOR HPI (12/21/20, Dr. Anne Clark): Rachel Clark is a 73 year old right-handed white female with a history of seizures since she was a child, she has not had a seizure in over 50 years, she is on carbamazepine and Zarontin, she has never wished to try to get off of her medication.  Her son also has a history of seizures.  Since August 2019 she has had low sodium levels.  She has been on a combination of carbamazepine and Cymbalta, but she has been on this for quite a number of years without any sodium level problems.  The patient has more recently had significant issues with orthostatic hypotension.  She does have a monoclonal antibody and is followed through hematology.  She has not had a demonstrated peripheral neuropathy on nerve conduction studies in the past.  The patient however has had  significant issues with being dizzy with standing, she has had multiple syncopal episodes, the most recent was 2 days ago.  The patient has been to the emergency room on 04 September 2020 after she hit her head with a blackout.  She went to the emergency room on 10 Nov 2020 with another significant blackout.  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  The patient is followed by Dr. Peter Clark.  Blood pressure medications have been reduced in dosing.  The patient has gone from 10 mg a day to 2.5 mg and Norvasc, and the Coreg dose was cut in half.  The patient has been told to increase fluids and salt.  She does have compression stockings.  The patient has had some decline in memory over time.  She is no longer driving mainly due to the dizziness issue.  The patient has had increasing problems with remembering recipes with cooking, she is still managing her medications.  Her husband helps her with the appointments.  The patient is repeating herself frequently throughout the day.  The patient claims that she feels bored, isolated.  She sleeps quite a bit throughout the day.  She comes to this office for further evaluation.  The patient does have a prior history of depression which is still an issue for her.  REVIEW OF SYSTEMS: Full 14 system review of systems performed and negative with exception of: as per HPI.  ALLERGIES: Allergies  Allergen Reactions   Sulfamethoxazole  Anaphylaxis    Don't recall   Aspirin Other (See Comments)    GI upset- can tolerate 81 mg ASA,and chewables, just not full doses   Crestor [Rosuvastatin]     myalgias   Erythromycin Nausea Only    Gi upset   Lisinopril Cough   Niacin And Related Other (See Comments)    Don't recall   Penicillin G Benzathine     Other reaction(s): Unknown   Statins Other (See Comments)    Muscle pain   Sulfa Drugs Cross Reactors Swelling    Don't recall   Tetracyclines & Related Nausea  Only   Xeloda [Capecitabine] Diarrhea   Penicillins Nausea Only and Rash        Plavix [Clopidogrel Bisulfate] Rash    HOME MEDICATIONS: Outpatient Medications Prior to Visit  Medication Sig Dispense Refill   acetaminophen (TYLENOL) 500 MG tablet Take 500-1,000 mg by mouth daily as needed for mild pain or headache.     amLODipine (NORVASC) 5 MG tablet TAKE 1 TABLET (5 MG TOTAL) BY MOUTH DAILY. 90 tablet 3   aspirin EC 81 MG tablet Take 1 tablet (81 mg total) by mouth daily. Swallow whole.     Bempedoic Acid-Ezetimibe (NEXLIZET) 180-10 MG TABS Take 180 mg by mouth daily. 90 tablet 3   calcium carbonate (TUMS - DOSED IN MG ELEMENTAL CALCIUM) 500 MG chewable tablet Chew 500 mg by mouth daily.     cephALEXin (KEFLEX) 500 MG capsule Take 1 capsule (500 mg total) by mouth 2 (two) times daily for 7 days. 14 capsule 0   Cholecalciferol (VITAMIN D) 50 MCG (2000 UT) CAPS Take 4,000 Units by mouth daily.     diphenhydrAMINE (BENADRYL) 25 MG tablet Take 25 mg by mouth daily as needed for allergies.     diphenhydramine-acetaminophen (TYLENOL PM) 25-500 MG TABS tablet Take 1 tablet by mouth at bedtime as needed (Sleep).     DULoxetine (CYMBALTA) 60 MG capsule Take 60 mg by mouth 2 (two) times daily.     Evolocumab (REPATHA SURECLICK) 140 MG/ML SOAJ Inject 140 mg into the skin every 14 (fourteen) days. 2 mL 11   fluticasone (FLONASE) 50 MCG/ACT nasal spray Place 1 spray into both nostrils daily as needed for allergies.     isosorbide mononitrate (IMDUR) 30 MG 24 hr tablet Take one-half tablet by mouth every day 45 tablet 3   levothyroxine (SYNTHROID, LEVOTHROID) 50 MCG tablet Take 50 mcg by mouth daily before breakfast.     nitroGLYCERIN (NITROSTAT) 0.4 MG SL tablet place 1 tablet under the tongue every 5 minutes as needed for chest pain 25 tablet 3   nystatin (MYCOSTATIN/NYSTOP) powder Apply 1 Application topically 3 (three) times daily.     pantoprazole (PROTONIX) 40 MG tablet TAKE 1 TABLET BY MOUTH  TWICE A DAY 180 tablet 3   prasugrel (EFFIENT) 10 MG TABS tablet Take 1 tablet (10 mg total) by mouth daily. 90 tablet 3   carbamazepine (TEGRETOL XR) 200 MG 12 hr tablet Take 3 tablets (600 mg total) by mouth 2 (two) times daily. 540 tablet 4   ethosuximide (ZARONTIN) 250 MG capsule Take 2 capsules (500 mg total) by mouth 2 (two) times daily. TAKE 2 CAPSULES IN THE MORNING, TAKE 2 CAPSULES AT NIGHT 360 capsule 3   memantine (NAMENDA) 10 MG tablet Take 1 tablet (10 mg total) by mouth 2 (two) times daily. 180 tablet 4   No facility-administered medications prior to visit.     PHYSICAL EXAM  GENERAL EXAM/CONSTITUTIONAL: Vitals:  Vitals:   09/06/23 1456  BP: (!) 159/81  Pulse: 93  Weight: 165 lb (74.8 kg)  Height: 5\' 7"  (1.702 m)   Body mass index is 25.84 kg/m. Wt Readings from Last 3 Encounters:  09/06/23 165 lb (74.8 kg)  08/31/23 169 lb 12.1 oz (77 kg)  07/25/23 170 lb (77.1 kg)   Patient is in no distress; well developed, nourished and groomed; neck is supple  CARDIOVASCULAR: Examination of carotid arteries is normal; no carotid bruits Regular rate and rhythm, no murmurs Examination of peripheral vascular system by observation and palpation is normal  EYES: Ophthalmoscopic exam of optic discs and posterior segments is normal; no papilledema or hemorrhages No results found.  MUSCULOSKELETAL: Gait, strength, tone, movements noted in Neurologic exam below  NEUROLOGIC: MENTAL STATUS:     12/21/2020    7:21 AM 08/17/2020   10:04 AM 08/18/2019    1:55 PM  MMSE - Mini Mental State Exam  Not completed:   --  Orientation to time 2 3 2   Orientation to Place 4 4 3   Registration 3 3 3   Attention/ Calculation 1 5 5   Recall 0 3 1  Language- name 2 objects 2 2 2   Language- repeat 1 1 1   Language- follow 3 step command 3 3 3   Language- read & follow direction 1 1 1   Write a sentence 1 1 1   Copy design 1 1 1   Total score 19 27 23    awake, alert, oriented to person, place  and time recent and remote memory intact normal attention and concentration language fluent, comprehension intact, naming intact fund of knowledge appropriate  CRANIAL NERVE:  2nd - no papilledema on fundoscopic exam 2nd, 3rd, 4th, 6th - pupils equal and reactive to light, visual fields full to confrontation, extraocular muscles intact, no nystagmus 5th - facial sensation symmetric 7th - facial strength symmetric 8th - hearing intact 9th - palate elevates symmetrically, uvula midline 11th - shoulder shrug symmetric 12th - tongue protrusion midline  MOTOR:  normal bulk and tone, full strength in the BUE, BLE  SENSORY:  normal and symmetric to light touch, temperature, vibration  COORDINATION:  finger-nose-finger, fine finger movements normal  REFLEXES:  deep tendon reflexes TRACE and symmetric  GAIT/STATION:  narrow based gait    DIAGNOSTIC DATA (LABS, IMAGING, TESTING) - I reviewed patient records, labs, notes, testing and imaging myself where available.  Lab Results  Component Value Date   WBC 8.6 08/31/2023   HGB 9.0 (L) 08/31/2023   HCT 28.9 (L) 08/31/2023   MCV 83.0 08/31/2023   PLT 575 (H) 08/31/2023      Component Value Date/Time   NA 138 08/31/2023 2130   NA 136 04/13/2023 0845   NA 137 07/11/2016 0820   K 4.4 08/31/2023 2130   K 4.4 07/11/2016 0820   CL 103 08/31/2023 2130   CL 106 12/25/2012 0924   CO2 25 08/31/2023 2130   CO2 27 07/11/2016 0820   GLUCOSE 91 08/31/2023 2130   GLUCOSE 115 07/11/2016 0820   GLUCOSE 105 (H) 12/25/2012 0924   BUN 20 08/31/2023 2130   BUN 15 04/13/2023 0845   BUN 15.6 07/11/2016 0820   CREATININE 0.86 08/31/2023 2130   CREATININE 0.85 01/05/2023 0915   CREATININE 0.8 07/11/2016 0820   CALCIUM 8.6 (L) 08/31/2023 2130   CALCIUM 8.9 07/11/2016 0820   PROT 7.2 08/31/2023 2130   PROT 7.2 04/13/2023 0845   PROT 7.1 07/11/2016 0820  ALBUMIN 3.5 08/31/2023 2130   ALBUMIN 4.3 04/13/2023 0845   ALBUMIN 3.6 07/11/2016  0820   AST 15 08/31/2023 2130   AST 12 (L) 01/05/2023 0915   AST 13 07/11/2016 0820   ALT 11 08/31/2023 2130   ALT 8 01/05/2023 0915   ALT 14 07/11/2016 0820   ALKPHOS 79 08/31/2023 2130   ALKPHOS 89 07/11/2016 0820   BILITOT 0.2 08/31/2023 2130   BILITOT 0.2 04/13/2023 0845   BILITOT 0.4 01/05/2023 0915   BILITOT 0.29 07/11/2016 0820   GFRNONAA >60 08/31/2023 2130   GFRNONAA >60 01/05/2023 0915   GFRAA 97 08/17/2020 1111   Lab Results  Component Value Date   CHOL 270 (H) 04/13/2023   HDL 85 04/13/2023   LDLCALC 157 (H) 04/13/2023   TRIG 160 (H) 04/13/2023   CHOLHDL 3.2 04/13/2023   Lab Results  Component Value Date   HGBA1C 5.5 02/20/2018   Lab Results  Component Value Date   VITAMINB12 625 09/25/2022   Lab Results  Component Value Date   TSH 1.782 11/10/2021   11/01/22 Neuropsychology testing - Major Neurocognitive Disorder (i.e., 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 A normal NfL concentration was observed at this time. These results are consistent with the presence of Alzheimer's related pathology.    09/04/20 CT head  1. No acute intracranial abnormality. 2. LEFT forehead subcutaneous hematoma.  10/09/22 CT head  1. No recent insult or specific cause for symptoms. Stable compared to 2022. 2. Left temporal lobe encephalomalacia.    ASSESSMENT AND PLAN  73 y.o. year old female here with:  Dx:  1. 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 Seizures (HCC)     PLAN:  SEIZURE DISORDER --> NEW ONSET MYOCLONUS, MEMORY LOSS, CONFUSION,  FATIGUE (in 09/24/22 and Feb 2025, related to  low levels)  - continue carbamazepine 600mg  twice a day + ethosuximide 500mg  twice a day  - seizure precautions reviewed  MEMORY LOSS (MCI diagnosed in 2019; now progressed; some changes in ADLs; now consistent with mild dementia) - continue memantine 10mg  twice a day  - safety / supervision issues reviewed - daily physical activity / exercise (at least 15-30 minutes) - eat more plants / vegetables - increase social activities, brain stimulation, games, puzzles, hobbies, crafts, arts, music - aim for at least 7-8 hours sleep per night (or more) - avoid smoking and alcohol - caution with medications, finances; no driving  Meds ordered this encounter  Medications   carbamazepine (TEGRETOL XR) 200 MG 12 hr tablet    Sig: Take 3 tablets (600 mg total) by mouth 2 (two) times daily.    Dispense:  540 tablet    Refill:  4   memantine (NAMENDA) 10 MG tablet    Sig: Take 1 tablet (10 mg total) by mouth 2 (two) times daily.    Dispense:  180 tablet    Refill:  4   ethosuximide (ZARONTIN) 250 MG capsule  Sig: Take 2 capsules (500 mg total) by mouth 2 (two) times daily.    Dispense:  360 capsule    Refill:  4   Return in about 6 months (around 03/08/2024) for MyChart visit (15 min), with NP Rachel Clark).    Suanne Marker, MD 09/06/2023, 3:47 PM Certified in Neurology, Neurophysiology and Neuroimaging  [facility] [address], [address] [place], [place] [address] [phone]

## 2023-09-06 NOTE — Patient Instructions (Signed)
  SEIZURE DISORDER --> NEW ONSET MYOCLONUS, MEMORY LOSS, CONFUSION,  FATIGUE (in 09/24/22 and Feb 2025, related to low levels)  - continue carbamazepine 600mg  twice a day + ethosuximide 500mg  twice a day  - seizure precautions reviewed  MEMORY LOSS (MCI diagnosed in 2019; now progressed; some changes in ADLs; now consistent with mild dementia) - continue memantine 10mg  twice a day  - safety / supervision issues reviewed - daily physical activity / exercise (at least 15-30 minutes) - eat more plants / vegetables - increase social activities, brain stimulation, games, puzzles, hobbies, crafts, arts, music - aim for at least 7-8 hours sleep per night (or more) - avoid smoking and alcohol - caution with medications, finances; no driving

## 2023-09-09 ENCOUNTER — Other Ambulatory Visit: Payer: Self-pay | Admitting: Cardiology

## 2023-09-12 DIAGNOSIS — K922 Gastrointestinal hemorrhage, unspecified: Secondary | ICD-10-CM | POA: Diagnosis not present

## 2023-09-12 DIAGNOSIS — D649 Anemia, unspecified: Secondary | ICD-10-CM | POA: Diagnosis not present

## 2023-09-13 ENCOUNTER — Other Ambulatory Visit: Payer: Self-pay | Admitting: Family Medicine

## 2023-09-13 DIAGNOSIS — M8580 Other specified disorders of bone density and structure, unspecified site: Secondary | ICD-10-CM

## 2023-09-13 DIAGNOSIS — M8588 Other specified disorders of bone density and structure, other site: Secondary | ICD-10-CM | POA: Diagnosis not present

## 2023-09-13 DIAGNOSIS — D500 Iron deficiency anemia secondary to blood loss (chronic): Secondary | ICD-10-CM | POA: Diagnosis not present

## 2023-09-13 DIAGNOSIS — I10 Essential (primary) hypertension: Secondary | ICD-10-CM | POA: Diagnosis not present

## 2023-09-13 DIAGNOSIS — G301 Alzheimer's disease with late onset: Secondary | ICD-10-CM | POA: Diagnosis not present

## 2023-09-13 DIAGNOSIS — G40909 Epilepsy, unspecified, not intractable, without status epilepticus: Secondary | ICD-10-CM | POA: Diagnosis not present

## 2023-09-13 DIAGNOSIS — Z87440 Personal history of urinary (tract) infections: Secondary | ICD-10-CM | POA: Diagnosis not present

## 2023-09-17 ENCOUNTER — Telehealth: Payer: Self-pay | Admitting: Hematology and Oncology

## 2023-09-17 NOTE — Telephone Encounter (Signed)
Spoke with patient husband confirming upcoming appointment  

## 2023-09-18 ENCOUNTER — Encounter: Payer: Self-pay | Admitting: Internal Medicine

## 2023-09-27 NOTE — Progress Notes (Signed)
 Remote pacemaker transmission.

## 2023-09-27 NOTE — Addendum Note (Signed)
 Addended by: Elease Etienne A on: 09/27/2023 02:05 PM   Modules accepted: Orders

## 2023-09-28 ENCOUNTER — Inpatient Hospital Stay

## 2023-09-28 ENCOUNTER — Encounter: Payer: Self-pay | Admitting: Hematology and Oncology

## 2023-09-28 ENCOUNTER — Other Ambulatory Visit

## 2023-09-28 ENCOUNTER — Inpatient Hospital Stay: Attending: Hematology and Oncology | Admitting: Hematology and Oncology

## 2023-09-28 VITALS — BP 138/64 | HR 89 | Resp 18 | Ht 67.0 in | Wt 167.0 lb

## 2023-09-28 DIAGNOSIS — Z85038 Personal history of other malignant neoplasm of large intestine: Secondary | ICD-10-CM | POA: Diagnosis not present

## 2023-09-28 DIAGNOSIS — D472 Monoclonal gammopathy: Secondary | ICD-10-CM | POA: Diagnosis not present

## 2023-09-28 DIAGNOSIS — K921 Melena: Secondary | ICD-10-CM | POA: Diagnosis not present

## 2023-09-28 DIAGNOSIS — G40909 Epilepsy, unspecified, not intractable, without status epilepticus: Secondary | ICD-10-CM | POA: Diagnosis not present

## 2023-09-28 DIAGNOSIS — R42 Dizziness and giddiness: Secondary | ICD-10-CM | POA: Insufficient documentation

## 2023-09-28 DIAGNOSIS — D649 Anemia, unspecified: Secondary | ICD-10-CM | POA: Insufficient documentation

## 2023-09-28 DIAGNOSIS — D500 Iron deficiency anemia secondary to blood loss (chronic): Secondary | ICD-10-CM | POA: Diagnosis not present

## 2023-09-28 DIAGNOSIS — Z79899 Other long term (current) drug therapy: Secondary | ICD-10-CM | POA: Insufficient documentation

## 2023-09-28 DIAGNOSIS — R5383 Other fatigue: Secondary | ICD-10-CM

## 2023-09-28 LAB — CBC WITH DIFFERENTIAL (CANCER CENTER ONLY)
Abs Immature Granulocytes: 0.02 10*3/uL (ref 0.00–0.07)
Basophils Absolute: 0 10*3/uL (ref 0.0–0.1)
Basophils Relative: 1 %
Eosinophils Absolute: 0.2 10*3/uL (ref 0.0–0.5)
Eosinophils Relative: 3 %
HCT: 28 % — ABNORMAL LOW (ref 36.0–46.0)
Hemoglobin: 8.7 g/dL — ABNORMAL LOW (ref 12.0–15.0)
Immature Granulocytes: 0 %
Lymphocytes Relative: 25 %
Lymphs Abs: 1.6 10*3/uL (ref 0.7–4.0)
MCH: 25.7 pg — ABNORMAL LOW (ref 26.0–34.0)
MCHC: 31.1 g/dL (ref 30.0–36.0)
MCV: 82.6 fL (ref 80.0–100.0)
Monocytes Absolute: 0.6 10*3/uL (ref 0.1–1.0)
Monocytes Relative: 9 %
Neutro Abs: 4.2 10*3/uL (ref 1.7–7.7)
Neutrophils Relative %: 62 %
Platelet Count: 471 10*3/uL — ABNORMAL HIGH (ref 150–400)
RBC: 3.39 MIL/uL — ABNORMAL LOW (ref 3.87–5.11)
RDW: 21.1 % — ABNORMAL HIGH (ref 11.5–15.5)
WBC Count: 6.6 10*3/uL (ref 4.0–10.5)
nRBC: 0 % (ref 0.0–0.2)

## 2023-09-28 LAB — ABO/RH: ABO/RH(D): O NEG

## 2023-09-28 LAB — IRON AND IRON BINDING CAPACITY (CC-WL,HP ONLY)
Iron: 330 ug/dL — ABNORMAL HIGH (ref 28–170)
Saturation Ratios: 94 % — ABNORMAL HIGH (ref 10.4–31.8)
TIBC: 351 ug/dL (ref 250–450)
UIBC: 21 ug/dL — ABNORMAL LOW (ref 148–442)

## 2023-09-28 LAB — SAMPLE TO BLOOD BANK

## 2023-09-28 LAB — FERRITIN: Ferritin: 49 ng/mL (ref 11–307)

## 2023-09-28 LAB — VITAMIN B12: Vitamin B-12: 237 pg/mL (ref 180–914)

## 2023-09-28 LAB — TSH: TSH: 1.164 u[IU]/mL (ref 0.350–4.500)

## 2023-09-28 NOTE — Assessment & Plan Note (Addendum)
 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

## 2023-09-28 NOTE — Assessment & Plan Note (Addendum)
 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

## 2023-09-28 NOTE — Progress Notes (Signed)
 [facility] OFFICE PROGRESS NOTE  Laurann Montana, MD  ASSESSMENT & PLAN:  Assessment & Plan History of colon cancer She has remote history of colon cancer diagnosed in 2013 Recent colonoscopy from January 2025 was reviewed which show no evidence of recurrence of colon cancer She will continue surveillance Other fatigue She has excessive fatigue and sleep more than 12 hours a day  She is on a lot of antiseizure medications and has not have seizure for over 51 years I recommend the patient to have discussion with her neurologist to consider medication taper Hematochezia Her colonoscopy from January 2025 was reviewed 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 anemia/GI bleed Repeat iron studies, and B12 level are still pending I will call her husband next week with test results If confirmed she has persistent iron deficiency, we will schedule intravenous iron infusion She does not need blood transfusion right now 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 Will keep her appointment as scheduled    Orders Placed This Encounter  Procedures   Ferritin    Standing Status:   Future    Number of Occurrences:   1    Expiration Date:   09/27/2024   Iron and Iron Binding Capacity ([facility] only)    Standing Status:   Future    Number of Occurrences:   1    Expiration Date:   09/27/2024   CBC with Differential (Cancer Center Only)    Standing Status:   Future    Number of Occurrences:   1    Expiration Date:   09/27/2024   Vitamin B12    Standing Status:   Future    Number of Occurrences:   1    Expiration Date:   09/27/2024   TSH    Standing Status:   Future    Number of Occurrences:   1    Expiration Date:   09/27/2024   CMP (Cancer Center only)    Standing Status:   Future    Expiration Date:   09/27/2024   CBC with Differential (Cancer Center Only)    Standing Status:   Future    Expiration Date:    09/27/2024   Ferritin    Standing Status:   Future    Expiration Date:   09/27/2024   Iron and Iron Binding Capacity ([facility] only)    Standing Status:   Future    Expiration Date:   09/27/2024   Kappa/lambda light chains    Standing Status:   Standing    Number of Occurrences:   22    Expiration Date:   09/27/2024   Multiple Myeloma Panel (SPEP&IFE w/QIG)    Standing Status:   Standing    Number of Occurrences:   22    Expiration Date:   09/27/2024   ABO/Rh    Standing Status:   Future    Number of Occurrences:   1    Expiration Date:   09/27/2024   Sample to Blood Bank    Standing Status:   Future    Number of Occurrences:   1    Expiration Date:   09/27/2024    INTERVAL HISTORY: Patient returns for recurrent anemia Her husband provided most of the history I reviewed her records She follows with me for MGUS once a year in July In January, she was admitted to the hospital after presentation with hematochezia I reviewed  records extensively including colonoscopy report, biopsy report, and lab work Colonoscopy from July 28, 2023 showed ulcer in the ascending colon, 3 colonic angioectasias status post APC and 4 polyps, not removed The patient is taking antiplatelet agent that was subsequently resumed The patient denies any recent signs or symptoms of bleeding such as spontaneous epistaxis, hematuria or hematochezia.  Symptoms of anemia includes dizziness/lightheadedness and fatigue According to the husband, she sleeps almost 11 hours every night and take 2 afternoon naps after breakfast and lunch She is on a lot of medications for history of seizure disorder but she have not have seizure for 51 years We reviewed repeat CBC result today  Vitals:   09/28/23 1105  BP: 138/64  Pulse: 89  Resp: 18  SpO2: 100%

## 2023-09-28 NOTE — Assessment & Plan Note (Addendum)
 She has excessive fatigue and sleep more than 12 hours a day  She is on a lot of antiseizure medications and has not have seizure for over 51 years I recommend the patient to have discussion with her neurologist to consider medication taper

## 2023-09-28 NOTE — Assessment & Plan Note (Addendum)
 She has remote history of colon cancer diagnosed in 2013 Recent colonoscopy from January 2025 was reviewed which show no evidence of recurrence of colon cancer She will continue surveillance

## 2023-10-03 ENCOUNTER — Telehealth: Payer: Self-pay | Admitting: Hematology and Oncology

## 2023-10-03 NOTE — Telephone Encounter (Signed)
 Spoke with patient and family and reviewed test results We discussed risks/benefits IV iron vs oral iron I recommend additional oral B12 supplement Addressed all their questions

## 2023-10-04 ENCOUNTER — Other Ambulatory Visit: Payer: Self-pay | Admitting: Pharmacy Technician

## 2023-10-04 ENCOUNTER — Other Ambulatory Visit: Payer: Self-pay | Admitting: Hematology and Oncology

## 2023-10-04 ENCOUNTER — Other Ambulatory Visit (HOSPITAL_COMMUNITY): Payer: Self-pay | Admitting: Pharmacy Technician

## 2023-10-04 ENCOUNTER — Telehealth: Payer: Self-pay

## 2023-10-04 NOTE — Telephone Encounter (Signed)
 Called husband back. Per Dr. Bertis Ruddy, a scheduling message has been sent to schedule IV iron at [facility] [address] and they will call to schedule appts. Given address of [facility] location. He verbalized understanding.

## 2023-10-04 NOTE — Telephone Encounter (Signed)
 Dr. Bertis Ruddy, patient will be scheduled as soon as possible.  Auth Submission: NO AUTH NEEDED Site of care: Site of care: [facility] Payer: Medicare A/B with [employer] supplement Medication & CPT/J Code(s) submitted: Feraheme (ferumoxytol) [id number] Route of submission (phone, fax, portal):  Phone # Fax # Auth type: Buy/Bill PB Units/visits requested: 510mg  x 2 doses Reference number:  Approval from: 10/04/23 to 02/03/24

## 2023-10-05 DIAGNOSIS — G301 Alzheimer's disease with late onset: Secondary | ICD-10-CM | POA: Diagnosis not present

## 2023-10-05 DIAGNOSIS — F02B3 Dementia in other diseases classified elsewhere, moderate, with mood disturbance: Secondary | ICD-10-CM | POA: Diagnosis not present

## 2023-10-07 NOTE — Progress Notes (Unsigned)
 Cardiology Office Note   Date:  10/10/2023   ID:  [name], DOB 1950-07-27, MRN [id number]  PCP:  Laurann Montana, MD  Cardiologist:   Les Longmore [place], MD   No chief complaint on file.  History of Present Illness: Rachel Clark is a 73 y.o. female is seen for follow up CAD. 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 Subsequent failure of SVG to RCA. S/p extensive stenting of RCA in the past- last intervention in 2017 with PTCA of ostial RCA for restenosis. In August 2019 stented RCA was widely patent.    Since November 2021 she has had multiple episodes of dizziness and syncope. She was found to be orthostatic. Seen in our clinic and later by Dr Johney Frame. Pacemaker function normal. Imdur was discontinued. Hydration seemed to improve symptoms. Echo was unremarkable.   Was seen in the ED on 09/04/20 with recurrent syncope after standing. She was orthostatic. Labs were unremarkable except mildly decreased sodium. No changes made at that time. Seen again in ED on May 11 with fall. Labs ok except low sodium level, pacer check and CXR Ok. Again hydrated. Her husband reports she passed out again on Father's day. Really doesn't have any warning. Is out only a few seconds. She has liberalized sodium intake. Wearing compression hose. Husband reports she naps 60% of the day. Seen recently by Dr Anne Hahn. No seizures noted. Husband has kept a BP record. Sometimes has a 20 point drop in BP with standing but at other times no change. We stopped her amlodipine.   She was admitted 03/08/21 with a NSTEMI. Ecg showed diffuse ST depression. Troponin peaked at 39. She underwent repeat cardiac cath showing patent LIMA to the LAD and patent stents in the RCA. There was new disease in a large ramus branch with sequential high grade stenoses. She had  PCI which was difficult due to tortuosity and calcification of the vessel making deliverablility of stents difficult. The disease was successfully covered with 3 DES. She was placed on Effient due to interaction of Brilinta with Tegretol. DC the following day. She has a history of rash on Plavix. Indefinite DAPT recommended due to extensive stents. Hgb at time of DC was 12.1.   She was readmitted on 03/13/21 with acute upper GI bleed with coffee ground emesis. Hgb dropped to 7.7 then to 6.5. she was transfused multiple units of PRBCs. DAPT was continued given recent stents. EGD showed a visible vessel in the gastric antrum treated with epinephrine injection and clipping. Treated with PPI. Bleeding scan was subsequently negative. Hgb improved to 9.6 at discharge.   She was seen in EP clinic in January. No significant arrhythmia noted and pacer OK.  She was admitted to [facility] in May 2023 with chest pain.  With low-level troponins she was initially treated medically and started on Imdur.  She returned to the ED later that evening with troponin rise in the 200s.  She underwent LHC with Difficult/complex but successful scoring balloon angioplasty with shockwave lithotripsy followed by distal overlapping DES stent placement to LCX.  With hemoglobin drop to 7.3, she received 2 units of pRBCs.   She underwent EGD showing small prepyloric gastric ulcer with no evidence of  active bleeding, some gastritis. Recommendations for IV protonix BID while inpatient, then switch to protonix 40mg  BID for 4 weeks at discharge, then  protonix 40mg  daily indefinitely. Was seen in June and doing OK. No bleeding. Repeat CBC in July showed stable Hgb 10.2.   She was evaluated in the ED on 01/23/2023 in the setting of chest pain.  Troponin was negative.  Outpatient follow-up was advised.  She was seen in the office on 02/02/2023 and reported recurrent chest pain.  She was scheduled for outpatient cardiac catheterization and was started on  Ranexa.  However, prior to her catheterization she presented to the ED on 02/03/2023 with chest pain.  Troponin was negative.  Cardiac catheterization was canceled and instead she underwent Lexiscan Myoview which was low risk.  Ranexa was discontinued due to interaction with Tegretol.  She was started on amlodipine 5 mg daily.  Hemoglobin dropped to as low as 8.4 during admission, she received 1 unit PRBC.  EGD showed LA grade B esophagitis and gastric erosions, no active bleeding.  Follow-up with GI was recommended.  She was discharged home in stable condition on 02/08/2023.     She was admitted in Jan with hematochezia 2 weeks post colonoscopy with removal of multiple polyps. Repeat colonoscopy showed colonic ulcer that was clipped. Also had some oozing AVMs that were treated. She was seen in ED in late Feb following a fall. Head CT negative. Treated for UTI.   On follow up today she is doing well. She denies any chest pain or dyspnea. Husband notes she is sleeping a lot more during the day. She is getting iron infusions with Dr Bertis Ruddy and is planning repeat colonoscopy in a couple of months with Dr Bosie Clos. She does note some lightheadedness at times. Husband reports BP has been very good.     Past Medical History:  Diagnosis Date   Arthritis    "fingers" (06/29/2016)   Chronic lower back pain    Colon cancer (HCC) 12/04/2011   s/p Laparoscopic-assisted transverse colectomy on 12/19/2011 by Dr. Dwain Sarna.  pT3 N0 M0.    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   last seizure was in 1972 ;takes Tegretol (06/29/2016)    Headache    "weekly" (06/29/2016)   Hyperlipidemia    Hyponatremia    Hypothyroid    Iron deficiency anemia 11/17/2011   Memory change 11/13/2016   Monoclonal gammopathy of unknown  significance    Orthostatic hypotension    Presence of permanent cardiac pacemaker    Syncope and collapse    pacemaker implanted    Past Surgical History:  Procedure Laterality Date   ANTERIOR CERVICAL DECOMP/DISCECTOMY FUSION  1980's   BACK SURGERY     CARDIAC CATHETERIZATION  05/20/2009   obstructive native vessel disease in LAD, RCA, and first diagonal, patent vein graft to distal RCA and LIMA to LAD,normal. ef 60%   CARDIAC CATHETERIZATION N/A 03/24/2015   Procedure: Left Heart Cath and Cors/Grafts Angiography;  Surgeon: Iran Ouch, MD;  Location: [facility] INVASIVE CV LAB;  Service: Cardiovascular;  Laterality: N/A;   CARDIAC CATHETERIZATION N/A 07/28/2015   Procedure: Left Heart Cath and Coronary Angiography;  Surgeon: Lennette Bihari, MD;  Location: [facility] INVASIVE CV LAB;  Service: Cardiovascular;  Laterality: N/A;   CARDIAC CATHETERIZATION N/A 11/09/2015   Procedure: Coronary Stent Intervention;  Surgeon: Tonny Bollman, MD;  Location: [facility] INVASIVE CV  LAB;  Service: Cardiovascular;  Laterality: N/A;   CARDIAC CATHETERIZATION N/A 11/09/2015   Procedure: Left Heart Cath and Coronary Angiography;  Surgeon: Tonny Bollman, MD;  Location: [facility] INVASIVE CV LAB;  Service: Cardiovascular;  Laterality: N/A;   CARDIAC CATHETERIZATION N/A 06/29/2016   Procedure: Left Heart Cath and Coronary Angiography;  Surgeon: Yvonne Kendall, MD;  Location: [facility] INVASIVE CV LAB;  Service: Cardiovascular;  Laterality: N/A;   CARDIAC CATHETERIZATION N/A 06/29/2016   Procedure: Intravascular Pressure Wire/FFR Study;  Surgeon: Yvonne Kendall, MD;  Location: [facility] INVASIVE CV LAB;  Service: Cardiovascular;  Laterality: N/A;   CARDIAC CATHETERIZATION N/A 06/29/2016   Procedure: Coronary Balloon Angioplasty;  Surgeon: Yvonne Kendall, MD;  Location: [facility] INVASIVE CV LAB;  Service: Cardiovascular;  Laterality: N/A;   COLON RESECTION  12/19/2011   Procedure: COLON RESECTION LAPAROSCOPIC;  Surgeon: Emelia Loron, MD;  Location:  [facility] OR;  Service: General;  Laterality: N/A;  laparoscopic hand assisted partial colon resection   COLON SURGERY     COLONOSCOPY     COLONOSCOPY WITH PROPOFOL N/A 07/28/2023   Procedure: COLONOSCOPY WITH PROPOFOL;  Surgeon: Kathi Der, MD;  Location: [facility] ENDOSCOPY;  Service: Gastroenterology;  Laterality: N/A;   CORONARY ANGIOPLASTY WITH STENT PLACEMENT  ~ 2007   1 stent   CORONARY ARTERY BYPASS GRAFT  2007   "CABG X2"   DILATION AND CURETTAGE OF UTERUS  1973   EP IMPLANTABLE DEVICE N/A 03/25/2015   MDT Advisa DR pacemaker implanted by Dr Johney Frame for transient complete heart block and syncope   ESOPHAGOGASTRODUODENOSCOPY (EGD) WITH PROPOFOL N/A 03/14/2021   Procedure: ESOPHAGOGASTRODUODENOSCOPY (EGD) WITH PROPOFOL;  Surgeon: Kerin Salen, MD;  Location: [facility] ENDOSCOPY;  Service: Gastroenterology;  Laterality: N/A;   ESOPHAGOGASTRODUODENOSCOPY (EGD) WITH PROPOFOL N/A 11/13/2021   Procedure: ESOPHAGOGASTRODUODENOSCOPY (EGD) WITH PROPOFOL;  Surgeon: Kathi Der, MD;  Location: [facility] ENDOSCOPY;  Service: Gastroenterology;  Laterality: N/A;   ESOPHAGOGASTRODUODENOSCOPY (EGD) WITH PROPOFOL N/A 02/07/2023   Procedure: ESOPHAGOGASTRODUODENOSCOPY (EGD) WITH PROPOFOL;  Surgeon: Kathi Der, MD;  Location: [facility] ENDOSCOPY;  Service: Gastroenterology;  Laterality: N/A;   HEMOSTASIS CLIP PLACEMENT  03/14/2021   Procedure: HEMOSTASIS CLIP PLACEMENT;  Surgeon: Kerin Salen, MD;  Location: [facility] ENDOSCOPY;  Service: Gastroenterology;;   HEMOSTASIS CLIP PLACEMENT  07/28/2023   Procedure: HEMOSTASIS CLIP PLACEMENT;  Surgeon: Kathi Der, MD;  Location: [facility] ENDOSCOPY;  Service: Gastroenterology;;   HOT HEMOSTASIS N/A 07/28/2023   Procedure: HOT HEMOSTASIS (ARGON PLASMA COAGULATION/BICAP);  Surgeon: Kathi Der, MD;  Location: [facility] ENDOSCOPY;  Service: Gastroenterology;  Laterality: N/A;   INSERT / REPLACE / REMOVE PACEMAKER     LEFT HEART CATH AND CORONARY ANGIOGRAPHY N/A 11/11/2021   Procedure: LEFT HEART  CATH AND CORONARY ANGIOGRAPHY;  Surgeon: Marykay Lex, MD;  Location: [facility] INVASIVE CV LAB;  Service: Cardiovascular;  Laterality: N/A;   LEFT HEART CATH AND CORS/GRAFTS ANGIOGRAPHY N/A 02/21/2018   Procedure: LEFT HEART CATH AND CORS/GRAFTS ANGIOGRAPHY;  Surgeon: Runell Gess, MD;  Location: [facility] INVASIVE CV LAB;  Service: Cardiovascular;  Laterality: N/A;   LEFT HEART CATH AND CORS/GRAFTS ANGIOGRAPHY N/A 03/08/2021   Procedure: LEFT HEART CATH AND CORS/GRAFTS ANGIOGRAPHY;  Surgeon: [place], Koren Sermersheim M, MD;  Location: [facility] INVASIVE CV LAB;  Service: Cardiovascular;  Laterality: N/A;   PORT-A-CATH REMOVAL N/A 06/12/2013   Procedure: REMOVAL PORT-A-CATH;  Surgeon: Emelia Loron, MD;  Location: [facility];  Service: General;  Laterality: N/A;   PORTACATH PLACEMENT  06/04/2012   Procedure: INSERTION PORT-A-CATH;  Surgeon: Emelia Loron, MD;  Location:  [facility] OR;  Service: General;  Laterality: N/A;  Insertion of port-a-cath    POSTERIOR LAMINECTOMY / DECOMPRESSION CERVICAL SPINE  1990s   SCLEROTHERAPY  03/14/2021   Procedure: SCLEROTHERAPY;  Surgeon: Kerin Salen, MD;  Location: [facility] ENDOSCOPY;  Service: Gastroenterology;;   VAGINAL HYSTERECTOMY       Current Outpatient Medications  Medication Sig Dispense Refill   acetaminophen (TYLENOL) 500 MG tablet Take 500-1,000 mg by mouth daily as needed for mild pain or headache.     amLODipine (NORVASC) 5 MG tablet TAKE 1 TABLET (5 MG TOTAL) BY MOUTH DAILY. 90 tablet 3   aspirin EC 81 MG tablet Take 1 tablet (81 mg total) by mouth daily. Swallow whole.     Bempedoic Acid-Ezetimibe (NEXLIZET) 180-10 MG TABS Take 180 mg by mouth daily. 90 tablet 3   calcium carbonate (TUMS - DOSED IN MG ELEMENTAL CALCIUM) 500 MG chewable tablet Chew 500 mg by mouth daily.     carbamazepine (TEGRETOL XR) 200 MG 12 hr tablet Take 3 tablets (600 mg total) by mouth 2 (two) times daily. 540 tablet 4   Cholecalciferol (VITAMIN D) 50 MCG (2000 UT) CAPS Take 4,000 Units  by mouth daily.     cyanocobalamin (VITAMIN B12) 500 MCG tablet Take 1,000 mcg by mouth daily.     DULoxetine (CYMBALTA) 60 MG capsule Take 60 mg by mouth 2 (two) times daily.     ethosuximide (ZARONTIN) 250 MG capsule Take 2 capsules (500 mg total) by mouth 2 (two) times daily. 360 capsule 4   Evolocumab (REPATHA SURECLICK) 140 MG/ML SOAJ Inject 140 mg into the skin every 14 (fourteen) days. 2 mL 11   ferrous sulfate 325 (65 FE) MG tablet Take 325 mg by mouth daily with breakfast.     fluticasone (FLONASE) 50 MCG/ACT nasal spray Place 1 spray into both nostrils daily as needed for allergies.     isosorbide mononitrate (IMDUR) 30 MG 24 hr tablet Take one-half tablet by mouth every day 45 tablet 3   levothyroxine (SYNTHROID, LEVOTHROID) 50 MCG tablet Take 50 mcg by mouth daily before breakfast.     memantine (NAMENDA) 10 MG tablet Take 1 tablet (10 mg total) by mouth 2 (two) times daily. 180 tablet 4   nitroGLYCERIN (NITROSTAT) 0.4 MG SL tablet place 1 tablet under the tongue every 5 minutes as needed for chest pain 25 tablet 3   nystatin (MYCOSTATIN/NYSTOP) powder Apply 1 Application topically 3 (three) times daily.     pantoprazole (PROTONIX) 40 MG tablet TAKE 1 TABLET BY MOUTH TWICE A DAY 180 tablet 3   prasugrel (EFFIENT) 10 MG TABS tablet TAKE ONE TABLET BY MOUTH ONE TIME DAILY 90 tablet 0   No current facility-administered medications for this visit.    Allergies:   Sulfamethoxazole, Aspirin, Crestor [rosuvastatin], Erythromycin, Lisinopril, Niacin and related, Penicillin g benzathine, Statins, Sulfa drugs cross reactors, Tetracyclines & related, Xeloda [capecitabine], Penicillins, and Plavix [clopidogrel bisulfate]    Social History:  The patient  reports that she quit smoking about 42 years ago. Her smoking use included cigarettes. She started smoking about 62 years ago. She has a 6 pack-year smoking history. She has never used smokeless tobacco. She reports current alcohol use of about  1.0 standard drink of alcohol per week. She reports that she does not use drugs.   Family History:  The patient's family history includes Cancer in her mother; Heart attack in her father; Heart attack (age of onset: 54) in her brother; Heart disease in  her father.    ROS:  Please see the history of present illness.   Otherwise, review of systems are positive for none.   All other systems are reviewed and negative.    PHYSICAL EXAM: VS:  BP 102/60 (BP Location: Right Arm, Patient Position: Sitting, Cuff Size: Normal)   Pulse 84   Ht 5\' 7"  (1.702 m)   Wt 167 lb (75.8 kg)   SpO2 96%   BMI 26.16 kg/m  , BMI Body mass index is 26.16 kg/m.   GEN: Well nourished, well developed, in no acute distress  HEENT: normal  Neck: no JVD, carotid bruits, or masses Cardiac: RRR; no murmurs, rubs, or gallops,no edema  Respiratory:  clear to auscultation bilaterally, normal work of breathing GI: soft, nontender, nondistended, + BS MS: no deformity or atrophy. No groin hematoma. Skin: warm and dry, no rash Neuro:  Strength and sensation are intact Psych: euthymic mood, full affect   EKG:  EKG is not ordered today.     Recent Labs: 07/27/2023: Magnesium 1.7 08/31/2023: ALT 11; BUN 20; Creatinine, Ser 0.86; Potassium 4.4; Sodium 138 09/28/2023: Hemoglobin 8.7; Platelet Count 471; TSH 1.164   Dated 03/24/21: Hgb 12.2. normal BMET.  Dated 05/30/21: cholesterol 241, triglycerides 116, HDL 81, LDL 140. CMET and TSH normal. Dated 07/17/23: cholesterol 157, triglycerides 85, HDL 73, LDL 72   Lipid Panel    Component Value Date/Time   CHOL 270 (H) 04/13/2023 0845   TRIG 160 (H) 04/13/2023 0845   HDL 85 04/13/2023 0845   CHOLHDL 3.2 04/13/2023 0845   CHOLHDL 2.3 03/09/2021 0218   VLDL 17 03/09/2021 0218   LDLCALC 157 (H) 04/13/2023 0845       Wt Readings from Last 3 Encounters:  10/10/23 167 lb (75.8 kg)  09/28/23 167 lb (75.8 kg)  09/06/23 165 lb (74.8 kg)      Other studies  Reviewed: Additional studies/ records that were reviewed today include:   Cardiac cath 02/21/18:  LEFT HEART CATH AND CORS/GRAFTS ANGIOGRAPHY    Conclusion    Previously placed Ost RCA to Mid RCA stent (unknown type) is widely patent. Prox LAD lesion is 100% stenosed. Origin lesion is 100% stenosed. The left ventricular systolic function is normal. LV end diastolic pressure is normal. The left ventricular ejection fraction is 55-65% by visual estimate.     Echo 07/30/20: IMPRESSIONS     1. Left ventricular ejection fraction, by estimation, is 60 to 65%. The  left ventricle has normal function. The left ventricle has no regional  wall motion abnormalities. Left ventricular diastolic parameters are  consistent with Grade I diastolic  dysfunction (impaired relaxation).   2. Right ventricular systolic function is normal. The right ventricular  size is normal.   3. The mitral valve is normal in structure. Trivial mitral valve  regurgitation. No evidence of mitral stenosis.   4. The aortic valve is tricuspid. Aortic valve regurgitation is not  visualized. Mild aortic valve sclerosis is present, with no evidence of  aortic valve stenosis.   5. The inferior vena cava is normal in size with greater than 50%  respiratory variability, suggesting right atrial pressure of 3 mmHg.   Comparison(s): Prior images unable to be directly viewed, comparison made  by report only. No significant change from prior study. 07/26/15 EF 60-65%.  GLS -18.6%.   Cardiac cath 03/08/21:  LEFT HEART CATH AND CORS/GRAFTS ANGIOGRAPHY   Conclusion      Prox LAD lesion is 100% stenosed.  Origin lesion is 100% stenosed.   Ost LAD to Prox LAD lesion is 60% stenosed.   Ost 1st Diag to 1st Diag lesion is 85% stenosed.   Ramus lesion is 99% stenosed.   Ost Ramus to Ramus lesion is 90% stenosed.   Non-stenotic Ost RCA to Mid RCA lesion was previously treated.   A drug-eluting stent was successfully placed  using a STENT ONYX FRONTIER 3.0X15.   A drug-eluting stent was successfully placed using a SYNERGY XD 3.0X16.   A drug-eluting stent was successfully placed using a SYNERGY XD 3.0X20.   Post intervention, there is a 0% residual stenosis.   Post intervention, there is a 0% residual stenosis.   The left ventricular systolic function is normal.   LV end diastolic pressure is normal.   The left ventricular ejection fraction is 55-65% by visual estimate.   The radiation dose exceeded thresholds defined in the "Patient Radiation Dose Management For Interventional Medical Procedures With Extensive Use of Fluoroscopy" policy. Specific follow up instructions will be provided to the patient prior to discharge.   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 This is the culprit Normal LV function Normal LVEDP Successful PCI of the Ramus intermediate with DES x 3 in overlapping fashion.   Plan: DAPT for one year. Anticipate possible DC tomorrow.   Diagnostic Dominance: Right Intervention   Cardiac cath 11/11/21:  LEFT HEART CATH AND CORONARY ANGIOGRAPHY   Conclusion      Ost LAD to Prox LAD lesion is 60% stenosed. Prox LAD lesion is 100% stenosed.   LIMA-graft was visualized by angiography and is normal in caliber.  The graft exhibits no disease.   SVG-dRCA: Origin lesion is 100% stenosed.  graft was not visualized due to known occlusion.   Ost RCA to Mid RCA overlapping full metal jacket DES stents, widely patent.   Ost 1st Diag to 1st Diag lesion is 85% stenosed.   ----------------------   Lesion segment #2 (all in-stent restenosis) Ost Ramus to Ramus lesion is 75% stenosed.  Ramus-2 lesion is 85% stenosed.  Ramus-1 lesion is 35% stenosed.   Scoring balloon angioplasty was performed using a BALLN SCOREFLEX 2.50X15 -> followed by post dilation  with a [place] Balloon 3.0 mm x 15 mm   Post intervention, there is a 20% residual stenosis  in the proximal portion of stent segment.  Post intervention, there is a 0% residual stenosis in the distal portion of the stent.   CULPRIT LESION #1: Ramus-3 lesion is 95% stenosed.-2 tandem locations of calcified stenosis.   Scoring balloon angioplasty was performed using a BALLN SCOREFLEX 2.50X15.   A drug-eluting stent was successfully placed (overlapping the distal stent) using a STENT ONYX FRONTIER 2.5X30 -> tapered postdilation from overlap 3.1 mm down to 2.8 mm distally.   Post intervention, there is a 0% residual stenosis.   ------------------------------------------------------------------------------   LV end diastolic pressure is normal.   There is no aortic valve stenosis.   SUMMARY Severe Native vessel CAD: stable severe proximal to mid LAD disease with LAD occlusion and patent LIMA-LAD,  widely patent full metal jacket stented RCA,  diffuse severe in-stent restenosis throughout the proximal and midportion of the stented segment of the native LCx with severe eccentric calcified lesions distal to the stent. Difficult/complex but successful scoring balloon angioplasty with shockwave lithotripsy followed  by distal overlapping DES stent placement (Onyx Frontier DES 2.5 x 30 tapered postdilation from 3.1 in the overlap segment to 2.8 distally) Scoreflex angioplasty followed by post dilation with 3.0 mm [place] balloon throughout the entire stented segment of the LCx reducing stenosis in the midportion to 10% and more proximally to 20%. Normal LVEDP   RECOMMENDATION Monitor overnight, anticipate discharge in the morning. Aggressive risk factor modification Essentially lifelong finding.  In coverage with DAPT x1 year       Bryan Lemma, MD   Echo 02/04/23:  IMPRESSIONS     1. Left ventricular ejection fraction, by estimation, is 50 to 55%. The  left ventricle has low normal function. The left  ventricle demonstrates  regional wall motion abnormalities (see scoring diagram/findings for  description). Left ventricular diastolic   parameters are consistent with Grade I diastolic dysfunction (impaired  relaxation).   2. Right ventricular systolic function is normal. The right ventricular  size is normal. Tricuspid regurgitation signal is inadequate for assessing  PA pressure.   3. The mitral valve is normal in structure. Mild to moderate mitral valve  regurgitation. No evidence of mitral stenosis.   4. The aortic valve is tricuspid. Aortic valve regurgitation is not  visualized. No aortic stenosis is present.   5. The inferior vena cava is normal in size with greater than 50%  respiratory variability, suggesting right atrial pressure of 3 mmHg.      Myoview 02/06/23: IMPRESSION: 1. No reversible ischemia or infarction.   2. Normal left ventricular wall motion.   3. Left ventricular ejection fraction 61%   4. Non invasive risk stratification*: Low ASSESSMENT AND PLAN:  1.  History of  orthostatic hypotension/and/or neurocardiogenic syncope. Pacer evaluation has been normal. Continue liberalization of sodium in diet. Maintain good hydration. Need to allow more permissive BP. Will continue current doses of amlodipine and Imdur for now.   2. CAD. S/p CABG. S/p extensive stenting of RCA.  in 2017. S/p NSTEMI with complex stenting of a large ramus intermediate branch with DES x 3 in September 2022.  Patent LIMA to LAD. Patent stents in RCA. On ASA and Effient indefinitely. Admitted in May 2023 with NSTEMI and complex PCI of the LCx by Dr Herbie Baltimore. Admitted this summer with chest pain. MI ruled out. Normal Myoview. EGD c/w severe esophagitis. Continue current therapy. On long term DAPT. She is > 1 year out from last stent so it would be ok to hold Effient for one week prior to colonoscopy.   3. Hyperlipidemia. LDL goal < 70. Last LDL 60. Excellent response with Repatha with 50% reduction  in LDL  Will repeat labs today  4. SSS. S/p pacemaker. Followed in device clinic.  5. Acute upper GI bleed requiring transfusion. S/p clipping of visible vessel in gastric antrum. More recent EGD in May showed gastric ulcer (nonbleeding) and gastritis.  On PPI. 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 Plan repeat colonoscopy. Iron infusions per hematology.   6. History of seizures. Monitored by Dr Anne Hahn.   7. Chronic mild hyponatremia.   8. Memory loss/dementia.    Current medicines are reviewed at length with the patient today.  The patient does not have concerns regarding medicines.  The following changes have been made:  See above.  Labs/ tests ordered today include: none  Orders Placed This Encounter  Procedures   Basic metabolic panel with GFR   Lipid panel   Hepatic function panel  Disposition:   FU 6 months  Signed, Yuki Brunsman [place], MD  10/10/2023 11:02 AM    [facility] [address] [place], [place], [address] Phone [phone], Fax [phone]

## 2023-10-08 ENCOUNTER — Telehealth: Payer: Self-pay | Admitting: Diagnostic Neuroimaging

## 2023-10-08 NOTE — Telephone Encounter (Signed)
 Pt's husband called stating that the pt's Oncologist agree's with weaning pt off of her Seizure medications. Husband would like to be called to be advised how he can do this without just doing it cold [place]. Please advise.

## 2023-10-08 NOTE — Telephone Encounter (Signed)
 Called and spoke to pt husband and relayed the following information:  Patient did have seizure like events (myoclonus, fatigue) spells in Mar 2024 and Feb 2025 in the setting of low anti-seizure medication levels. These improved with better seizure medication compliance. Therefore I do not recommend patient be weaned off anti-seizure meds.    Pt husband voiced gratitude and understanding

## 2023-10-08 NOTE — Telephone Encounter (Signed)
 Thank you for the update Patient and husband told me, she has not have seizure for almost 50 years

## 2023-10-08 NOTE — Telephone Encounter (Signed)
 Patient did have seizure like events (myoclonus, fatigue) spells in Mar 2024 and Feb 2025 in the setting of low anti-seizure medication levels. These improved with better seizure medication compliance. Therefore I do not recommend patient be weaned off anti-seizure meds.   Suanne Marker, MD 10/08/2023, 2:36 PM Certified in Neurology, Neurophysiology and Neuroimaging  [facility] [address] [place], [place] [address] [phone]

## 2023-10-10 ENCOUNTER — Ambulatory Visit: Payer: Medicare Other | Attending: Cardiology | Admitting: Cardiology

## 2023-10-10 VITALS — BP 102/60 | HR 84 | Ht 67.0 in | Wt 167.0 lb

## 2023-10-10 DIAGNOSIS — I495 Sick sinus syndrome: Secondary | ICD-10-CM

## 2023-10-10 DIAGNOSIS — E785 Hyperlipidemia, unspecified: Secondary | ICD-10-CM

## 2023-10-10 DIAGNOSIS — I25118 Atherosclerotic heart disease of native coronary artery with other forms of angina pectoris: Secondary | ICD-10-CM

## 2023-10-10 DIAGNOSIS — Z950 Presence of cardiac pacemaker: Secondary | ICD-10-CM

## 2023-10-10 LAB — HEPATIC FUNCTION PANEL
ALT: 8 IU/L (ref 0–32)
AST: 12 IU/L (ref 0–40)
Albumin: 4 g/dL (ref 3.8–4.8)
Alkaline Phosphatase: 59 IU/L (ref 44–121)
Bilirubin Total: <0.2 mg/dL (ref 0.0–1.2)
Bilirubin, Direct: 0.09 mg/dL (ref 0.00–0.40)
Total Protein: 6.4 g/dL (ref 6.0–8.5)

## 2023-10-10 LAB — LIPID PANEL
Chol/HDL Ratio: 1.9 ratio (ref 0.0–4.4)
Cholesterol, Total: 163 mg/dL (ref 100–199)
HDL: 88 mg/dL (ref >39)
LDL Chol Calc (NIH): 59 mg/dL (ref 0–99)
Triglycerides: 86 mg/dL (ref 0–149)
VLDL Cholesterol Cal: 16 mg/dL (ref 5–40)

## 2023-10-10 LAB — BASIC METABOLIC PANEL WITH GFR
BUN/Creatinine Ratio: 24 (ref 12–28)
BUN: 17 mg/dL (ref 8–27)
CO2: 22 mmol/L (ref 20–29)
Calcium: 9.6 mg/dL (ref 8.7–10.3)
Chloride: 99 mmol/L (ref 96–106)
Creatinine, Ser: 0.72 mg/dL (ref 0.57–1.00)
Glucose: 112 mg/dL — ABNORMAL HIGH (ref 70–99)
Potassium: 4.9 mmol/L (ref 3.5–5.2)
Sodium: 133 mmol/L — ABNORMAL LOW (ref 134–144)
eGFR: 88 mL/min/{1.73_m2} (ref >59)

## 2023-10-10 NOTE — Patient Instructions (Signed)
 Medication Instructions:  Continue same medications *If you need a refill on your cardiac medications before your next appointment, please call your pharmacy*  Lab Work: Bmet,lipid and hepatic panels today  Testing/Procedures: None ordered  Follow-Up: At [facility], you and your health needs are our priority.  As part of our continuing mission to provide you with exceptional heart care, our providers are all part of one team.  This team includes your primary Cardiologist (physician) and Advanced Practice Providers or APPs (Physician Assistants and Nurse Practitioners) who all work together to provide you with the care you need, when you need it.  Your next appointment:  6 months   Call in July to schedule Oct appointment     Provider:  Dr.Jordan   We recommend signing up for the patient portal called "MyChart".  Sign up information is provided on this After Visit Summary.  MyChart is used to connect with patients for Virtual Visits (Telemedicine).  Patients are able to view lab/test results, encounter notes, upcoming appointments, etc.  Non-urgent messages can be sent to your provider as well.   To learn more about what you can do with MyChart, go to [email or web address].         1st Floor: - Lobby - Registration  - Pharmacy  - Lab - Cafe  2nd Floor: - PV Lab - Diagnostic Testing (echo, CT, nuclear med)  3rd Floor: - Vacant  4th Floor: - TCTS (cardiothoracic surgery) - AFib Clinic - Structural Heart Clinic - Vascular Surgery  - Vascular Ultrasound  5th Floor: - HeartCare Cardiology (general and EP) - Clinical Pharmacy for coumadin, hypertension, lipid, weight-loss medications, and med management appointments    Valet parking services will be available as well.

## 2023-10-12 ENCOUNTER — Ambulatory Visit

## 2023-10-12 VITALS — BP 117/69 | HR 76 | Temp 97.8°F | Resp 16 | Ht 67.0 in | Wt 167.0 lb

## 2023-10-12 DIAGNOSIS — D509 Iron deficiency anemia, unspecified: Secondary | ICD-10-CM

## 2023-10-12 DIAGNOSIS — D500 Iron deficiency anemia secondary to blood loss (chronic): Secondary | ICD-10-CM

## 2023-10-12 MED ORDER — ACETAMINOPHEN 325 MG PO TABS
650.0000 mg | ORAL_TABLET | Freq: Once | ORAL | Status: AC
  Administered 2023-10-12: 650 mg via ORAL
  Filled 2023-10-12: qty 2

## 2023-10-12 MED ORDER — SODIUM CHLORIDE 0.9 % IV SOLN
510.0000 mg | Freq: Once | INTRAVENOUS | Status: AC
  Administered 2023-10-12: 510 mg via INTRAVENOUS
  Filled 2023-10-12: qty 17

## 2023-10-12 NOTE — Progress Notes (Signed)
 Diagnosis: Iron Deficiency Anemia  Provider:  Chilton Greathouse MD  Procedure: IV Infusion  IV Type: Peripheral, IV Location: L Antecubital  Feraheme (Ferumoxytol), Dose: 510 mg  Infusion Start Time: 1139  Infusion Stop Time: 1157  Post Infusion IV Care: Observation period completed and Peripheral IV Discontinued  Discharge: Condition: Stable, Destination: Home . AVS Declined  Performed by:  Wyvonne Lenz, RN

## 2023-10-17 ENCOUNTER — Emergency Department (HOSPITAL_COMMUNITY)

## 2023-10-17 ENCOUNTER — Other Ambulatory Visit: Payer: Self-pay

## 2023-10-17 ENCOUNTER — Inpatient Hospital Stay (HOSPITAL_COMMUNITY)
Admission: RE | Admit: 2023-10-17 | Discharge: 2023-10-22 | DRG: 536 | Disposition: A | Discharge status: Rehabilitation Facility | Attending: Surgery | Admitting: Surgery

## 2023-10-17 DIAGNOSIS — Z87892 Personal history of anaphylaxis: Secondary | ICD-10-CM

## 2023-10-17 DIAGNOSIS — R918 Other nonspecific abnormal finding of lung field: Secondary | ICD-10-CM | POA: Diagnosis not present

## 2023-10-17 DIAGNOSIS — Z79899 Other long term (current) drug therapy: Secondary | ICD-10-CM

## 2023-10-17 DIAGNOSIS — E871 Hypo-osmolality and hyponatremia: Secondary | ICD-10-CM | POA: Diagnosis present

## 2023-10-17 DIAGNOSIS — Z888 Allergy status to other drugs, medicaments and biological substances status: Secondary | ICD-10-CM

## 2023-10-17 DIAGNOSIS — Z79890 Hormone replacement therapy (postmenopausal): Secondary | ICD-10-CM

## 2023-10-17 DIAGNOSIS — M160 Bilateral primary osteoarthritis of hip: Secondary | ICD-10-CM | POA: Diagnosis not present

## 2023-10-17 DIAGNOSIS — T794XXA Traumatic shock, initial encounter: Secondary | ICD-10-CM | POA: Diagnosis not present

## 2023-10-17 DIAGNOSIS — K5901 Slow transit constipation: Secondary | ICD-10-CM | POA: Diagnosis not present

## 2023-10-17 DIAGNOSIS — S32110D Nondisplaced Zone I fracture of sacrum, subsequent encounter for fracture with routine healing: Secondary | ICD-10-CM | POA: Diagnosis not present

## 2023-10-17 DIAGNOSIS — S3210XA Unspecified fracture of sacrum, initial encounter for closed fracture: Secondary | ICD-10-CM | POA: Diagnosis not present

## 2023-10-17 DIAGNOSIS — S329XXA Fracture of unspecified parts of lumbosacral spine and pelvis, initial encounter for closed fracture: Principal | ICD-10-CM | POA: Diagnosis present

## 2023-10-17 DIAGNOSIS — Z951 Presence of aortocoronary bypass graft: Secondary | ICD-10-CM | POA: Diagnosis not present

## 2023-10-17 DIAGNOSIS — Z85038 Personal history of other malignant neoplasm of large intestine: Secondary | ICD-10-CM

## 2023-10-17 DIAGNOSIS — Z950 Presence of cardiac pacemaker: Secondary | ICD-10-CM | POA: Diagnosis not present

## 2023-10-17 DIAGNOSIS — K5900 Constipation, unspecified: Secondary | ICD-10-CM | POA: Diagnosis present

## 2023-10-17 DIAGNOSIS — I10 Essential (primary) hypertension: Secondary | ICD-10-CM | POA: Diagnosis not present

## 2023-10-17 DIAGNOSIS — J984 Other disorders of lung: Secondary | ICD-10-CM | POA: Diagnosis not present

## 2023-10-17 DIAGNOSIS — S32110A Nondisplaced Zone I fracture of sacrum, initial encounter for closed fracture: Secondary | ICD-10-CM | POA: Diagnosis present

## 2023-10-17 DIAGNOSIS — Z881 Allergy status to other antibiotic agents status: Secondary | ICD-10-CM

## 2023-10-17 DIAGNOSIS — W19XXXD Unspecified fall, subsequent encounter: Secondary | ICD-10-CM | POA: Diagnosis present

## 2023-10-17 DIAGNOSIS — E663 Overweight: Secondary | ICD-10-CM | POA: Diagnosis present

## 2023-10-17 DIAGNOSIS — I2510 Atherosclerotic heart disease of native coronary artery without angina pectoris: Secondary | ICD-10-CM | POA: Diagnosis not present

## 2023-10-17 DIAGNOSIS — S32810A Multiple fractures of pelvis with stable disruption of pelvic ring, initial encounter for closed fracture: Secondary | ICD-10-CM | POA: Diagnosis not present

## 2023-10-17 DIAGNOSIS — N390 Urinary tract infection, site not specified: Secondary | ICD-10-CM | POA: Diagnosis not present

## 2023-10-17 DIAGNOSIS — Z8673 Personal history of transient ischemic attack (TIA), and cerebral infarction without residual deficits: Secondary | ICD-10-CM | POA: Diagnosis not present

## 2023-10-17 DIAGNOSIS — S32592D Other specified fracture of left pubis, subsequent encounter for fracture with routine healing: Secondary | ICD-10-CM | POA: Diagnosis not present

## 2023-10-17 DIAGNOSIS — Z8711 Personal history of peptic ulcer disease: Secondary | ICD-10-CM

## 2023-10-17 DIAGNOSIS — S32592A Other specified fracture of left pubis, initial encounter for closed fracture: Secondary | ICD-10-CM | POA: Diagnosis not present

## 2023-10-17 DIAGNOSIS — F0390 Unspecified dementia without behavioral disturbance: Secondary | ICD-10-CM | POA: Diagnosis present

## 2023-10-17 DIAGNOSIS — Z955 Presence of coronary angioplasty implant and graft: Secondary | ICD-10-CM

## 2023-10-17 DIAGNOSIS — I25810 Atherosclerosis of coronary artery bypass graft(s) without angina pectoris: Secondary | ICD-10-CM | POA: Diagnosis not present

## 2023-10-17 DIAGNOSIS — Z7902 Long term (current) use of antithrombotics/antiplatelets: Secondary | ICD-10-CM

## 2023-10-17 DIAGNOSIS — J9811 Atelectasis: Secondary | ICD-10-CM | POA: Diagnosis not present

## 2023-10-17 DIAGNOSIS — W19XXXA Unspecified fall, initial encounter: Secondary | ICD-10-CM

## 2023-10-17 DIAGNOSIS — M47811 Spondylosis without myelopathy or radiculopathy, occipito-atlanto-axial region: Secondary | ICD-10-CM | POA: Diagnosis not present

## 2023-10-17 DIAGNOSIS — M797 Fibromyalgia: Secondary | ICD-10-CM | POA: Diagnosis present

## 2023-10-17 DIAGNOSIS — S32512A Fracture of superior rim of left pubis, initial encounter for closed fracture: Principal | ICD-10-CM | POA: Diagnosis present

## 2023-10-17 DIAGNOSIS — Z1611 Resistance to penicillins: Secondary | ICD-10-CM | POA: Diagnosis not present

## 2023-10-17 DIAGNOSIS — Z7409 Other reduced mobility: Secondary | ICD-10-CM | POA: Diagnosis present

## 2023-10-17 DIAGNOSIS — I959 Hypotension, unspecified: Secondary | ICD-10-CM | POA: Diagnosis present

## 2023-10-17 DIAGNOSIS — Z886 Allergy status to analgesic agent status: Secondary | ICD-10-CM | POA: Diagnosis not present

## 2023-10-17 DIAGNOSIS — E7800 Pure hypercholesterolemia, unspecified: Secondary | ICD-10-CM | POA: Diagnosis not present

## 2023-10-17 DIAGNOSIS — Z7982 Long term (current) use of aspirin: Secondary | ICD-10-CM

## 2023-10-17 DIAGNOSIS — I9589 Other hypotension: Secondary | ICD-10-CM | POA: Diagnosis not present

## 2023-10-17 DIAGNOSIS — E785 Hyperlipidemia, unspecified: Secondary | ICD-10-CM | POA: Diagnosis not present

## 2023-10-17 DIAGNOSIS — S3219XA Other fracture of sacrum, initial encounter for closed fracture: Secondary | ICD-10-CM | POA: Diagnosis not present

## 2023-10-17 DIAGNOSIS — R338 Other retention of urine: Secondary | ICD-10-CM | POA: Diagnosis not present

## 2023-10-17 DIAGNOSIS — M25551 Pain in right hip: Secondary | ICD-10-CM | POA: Diagnosis not present

## 2023-10-17 DIAGNOSIS — M5023 Other cervical disc displacement, cervicothoracic region: Secondary | ICD-10-CM | POA: Diagnosis not present

## 2023-10-17 DIAGNOSIS — G9389 Other specified disorders of brain: Secondary | ICD-10-CM | POA: Diagnosis not present

## 2023-10-17 DIAGNOSIS — Y92239 Unspecified place in hospital as the place of occurrence of the external cause: Secondary | ICD-10-CM | POA: Diagnosis not present

## 2023-10-17 DIAGNOSIS — G40909 Epilepsy, unspecified, not intractable, without status epilepticus: Secondary | ICD-10-CM | POA: Diagnosis present

## 2023-10-17 DIAGNOSIS — R9431 Abnormal electrocardiogram [ECG] [EKG]: Secondary | ICD-10-CM | POA: Diagnosis not present

## 2023-10-17 DIAGNOSIS — D649 Anemia, unspecified: Secondary | ICD-10-CM | POA: Diagnosis present

## 2023-10-17 DIAGNOSIS — S0990XA Unspecified injury of head, initial encounter: Secondary | ICD-10-CM | POA: Diagnosis not present

## 2023-10-17 DIAGNOSIS — E039 Hypothyroidism, unspecified: Secondary | ICD-10-CM | POA: Diagnosis present

## 2023-10-17 DIAGNOSIS — Z882 Allergy status to sulfonamides status: Secondary | ICD-10-CM

## 2023-10-17 DIAGNOSIS — M47812 Spondylosis without myelopathy or radiculopathy, cervical region: Secondary | ICD-10-CM | POA: Diagnosis not present

## 2023-10-17 DIAGNOSIS — Z6826 Body mass index (BMI) 26.0-26.9, adult: Secondary | ICD-10-CM

## 2023-10-17 DIAGNOSIS — Z880 Allergy status to penicillin: Secondary | ICD-10-CM

## 2023-10-17 DIAGNOSIS — M47816 Spondylosis without myelopathy or radiculopathy, lumbar region: Secondary | ICD-10-CM | POA: Diagnosis not present

## 2023-10-17 DIAGNOSIS — S32391D Other fracture of right ilium, subsequent encounter for fracture with routine healing: Secondary | ICD-10-CM | POA: Diagnosis not present

## 2023-10-17 DIAGNOSIS — S3282XD Multiple fractures of pelvis without disruption of pelvic ring, subsequent encounter for fracture with routine healing: Secondary | ICD-10-CM | POA: Diagnosis not present

## 2023-10-17 HISTORY — DX: Unspecified dementia, unspecified severity, without behavioral disturbance, psychotic disturbance, mood disturbance, and anxiety: F03.90

## 2023-10-17 LAB — PROTIME-INR
INR: 1.2 (ref 0.8–1.2)
Prothrombin Time: 14.9 s (ref 11.4–15.2)

## 2023-10-17 LAB — I-STAT CHEM 8, ED
BUN: 16 mg/dL (ref 8–23)
Calcium, Ion: 1.06 mmol/L — ABNORMAL LOW (ref 1.15–1.40)
Chloride: 103 mmol/L (ref 98–111)
Creatinine, Ser: 0.8 mg/dL (ref 0.44–1.00)
Glucose, Bld: 114 mg/dL — ABNORMAL HIGH (ref 70–99)
HCT: 28 % — ABNORMAL LOW (ref 36.0–46.0)
Hemoglobin: 9.5 g/dL — ABNORMAL LOW (ref 12.0–15.0)
Potassium: 4.2 mmol/L (ref 3.5–5.1)
Sodium: 136 mmol/L (ref 135–145)
TCO2: 23 mmol/L (ref 22–32)

## 2023-10-17 LAB — COMPREHENSIVE METABOLIC PANEL WITH GFR
ALT: 12 U/L (ref 0–44)
AST: 19 U/L (ref 15–41)
Albumin: 3 g/dL — ABNORMAL LOW (ref 3.5–5.0)
Alkaline Phosphatase: 47 U/L (ref 38–126)
Anion gap: 9 (ref 5–15)
BUN: 15 mg/dL (ref 8–23)
CO2: 24 mmol/L (ref 22–32)
Calcium: 8.3 mg/dL — ABNORMAL LOW (ref 8.9–10.3)
Chloride: 104 mmol/L (ref 98–111)
Creatinine, Ser: 0.8 mg/dL (ref 0.44–1.00)
GFR, Estimated: >60 mL/min (ref >60)
Glucose, Bld: 119 mg/dL — ABNORMAL HIGH (ref 70–99)
Potassium: 4.3 mmol/L (ref 3.5–5.1)
Sodium: 137 mmol/L (ref 135–145)
Total Bilirubin: 0.4 mg/dL (ref 0.0–1.2)
Total Protein: 5.8 g/dL — ABNORMAL LOW (ref 6.5–8.1)

## 2023-10-17 LAB — CBC
HCT: 28.8 % — ABNORMAL LOW (ref 36.0–46.0)
HCT: 27.3 % — ABNORMAL LOW (ref 36.0–46.0)
Hemoglobin: 8.9 g/dL — ABNORMAL LOW (ref 12.0–15.0)
Hemoglobin: 8.7 g/dL — ABNORMAL LOW (ref 12.0–15.0)
MCH: 27.3 pg (ref 26.0–34.0)
MCH: 27.5 pg (ref 26.0–34.0)
MCHC: 30.9 g/dL (ref 30.0–36.0)
MCHC: 31.9 g/dL (ref 30.0–36.0)
MCV: 88.3 fL (ref 80.0–100.0)
MCV: 86.4 fL (ref 80.0–100.0)
Platelets: 357 10*3/uL (ref 150–400)
Platelets: 332 10*3/uL (ref 150–400)
RBC: 3.26 MIL/uL — ABNORMAL LOW (ref 3.87–5.11)
RBC: 3.16 MIL/uL — ABNORMAL LOW (ref 3.87–5.11)
RDW: 21.8 % — ABNORMAL HIGH (ref 11.5–15.5)
RDW: 21.7 % — ABNORMAL HIGH (ref 11.5–15.5)
WBC: 13.5 10*3/uL — ABNORMAL HIGH (ref 4.0–10.5)
WBC: 8.5 10*3/uL (ref 4.0–10.5)
nRBC: 0 % (ref 0.0–0.2)
nRBC: 0 % (ref 0.0–0.2)

## 2023-10-17 LAB — SAMPLE TO BLOOD BANK

## 2023-10-17 LAB — TSH: TSH: 1.28 u[IU]/mL (ref 0.350–4.500)

## 2023-10-17 LAB — I-STAT CG4 LACTIC ACID, ED: Lactic Acid, Venous: 1.5 mmol/L (ref 0.5–1.9)

## 2023-10-17 LAB — ETHANOL: Alcohol, Ethyl (B): <10 mg/dL (ref <10)

## 2023-10-17 MED ORDER — LACTATED RINGERS IV SOLN: INTRAVENOUS | Status: AC

## 2023-10-17 MED ORDER — ENOXAPARIN SODIUM 30 MG/0.3ML IJ SOSY
30.0000 mg | PREFILLED_SYRINGE | Freq: Two times a day (BID) | INTRAMUSCULAR | Status: DC
  Administered 2023-10-19 – 2023-10-22 (×7): 30 mg via SUBCUTANEOUS
  Filled 2023-10-17 (×6): qty 0.3

## 2023-10-17 MED ORDER — DOCUSATE SODIUM 100 MG PO CAPS
100.0000 mg | ORAL_CAPSULE | Freq: Two times a day (BID) | ORAL | Status: DC
  Administered 2023-10-17 – 2023-10-22 (×9): 100 mg via ORAL
  Filled 2023-10-17 (×9): qty 1

## 2023-10-17 MED ORDER — OXYCODONE HCL 5 MG PO TABS: 5.0000 mg | ORAL_TABLET | ORAL | Status: DC | PRN

## 2023-10-17 MED ORDER — METOPROLOL TARTRATE 5 MG/5ML IV SOLN: 5.0000 mg | Freq: Four times a day (QID) | INTRAVENOUS | Status: DC | PRN

## 2023-10-17 MED ORDER — MORPHINE SULFATE (PF) 4 MG/ML IV SOLN
4.0000 mg | INTRAVENOUS | Status: DC | PRN
  Administered 2023-10-18 (×2): 4 mg via INTRAVENOUS
  Filled 2023-10-17 (×2): qty 1

## 2023-10-17 MED ORDER — ONDANSETRON 4 MG PO TBDP: 4.0000 mg | ORAL_TABLET | Freq: Four times a day (QID) | ORAL | Status: DC | PRN

## 2023-10-17 MED ORDER — ONDANSETRON HCL 4 MG/2ML IJ SOLN
4.0000 mg | Freq: Four times a day (QID) | INTRAMUSCULAR | Status: DC | PRN
  Administered 2023-10-20: 4 mg via INTRAVENOUS
  Filled 2023-10-17 (×2): qty 2

## 2023-10-17 MED ORDER — METHOCARBAMOL 1000 MG/10ML IJ SOLN: 500.0000 mg | Freq: Three times a day (TID) | INTRAMUSCULAR | Status: DC

## 2023-10-17 MED ORDER — OXYCODONE HCL 5 MG PO TABS
2.5000 mg | ORAL_TABLET | ORAL | Status: DC | PRN
  Administered 2023-10-17 – 2023-10-22 (×6): 5 mg via ORAL
  Filled 2023-10-17 (×9): qty 1

## 2023-10-17 MED ORDER — HYDRALAZINE HCL 20 MG/ML IJ SOLN: 10.0000 mg | INTRAMUSCULAR | Status: DC | PRN

## 2023-10-17 MED ORDER — IOHEXOL 350 MG/ML SOLN
80.0000 mL | Freq: Once | INTRAVENOUS | Status: AC | PRN
  Administered 2023-10-17: 80 mL via INTRAVENOUS

## 2023-10-17 MED ORDER — ACETAMINOPHEN 500 MG PO TABS
1000.0000 mg | ORAL_TABLET | Freq: Four times a day (QID) | ORAL | Status: DC
  Administered 2023-10-17 – 2023-10-22 (×19): 1000 mg via ORAL
  Filled 2023-10-17 (×19): qty 2

## 2023-10-17 MED ORDER — METHOCARBAMOL 500 MG PO TABS
500.0000 mg | ORAL_TABLET | Freq: Three times a day (TID) | ORAL | Status: DC
  Administered 2023-10-17 – 2023-10-18 (×3): 500 mg via ORAL
  Filled 2023-10-17 (×4): qty 1

## 2023-10-17 MED ORDER — POLYETHYLENE GLYCOL 3350 17 G PO PACK: 17.0000 g | PACK | Freq: Every day | ORAL | Status: DC | PRN

## 2023-10-17 NOTE — ED Notes (Signed)
 Trauma Response Nurse Documentation  Rachel Clark is a 73 y.o. female arriving to [facility] ED via EMS  Trauma was activated as a Level 1 based on the following trauma criteria Anytime Systolic Blood Pressure < 90.  Patient cleared for CT by Dr. Aniceto Barley. Pt transported to CT with trauma response nurse present to monitor. RN remained with the patient throughout their absence from the department for clinical observation. GCS 14.  Trauma MD Arrival Time: on patient arrival.  History   No past medical history on file.      Initial Focused Assessment (If applicable, or please see trauma documentation): Patient A&Ox4, GCS 14, PERR 3 Airway intact, bilateral breath sounds Pulses 2+  CT's Completed:   CT Head, CT C-Spine, CT Chest w/ contrast, and CT abdomen/pelvis w/ contrast   Interventions:  IV, labs CXR/PXR CT Head/Cspine/C/A/P  Plan for disposition:  Admission to Progressive [facility]   Consults completed:  Orthopaedic Surgeon at 1100.  Event Summary: Patient to ED after a fall. Imaging revealed pelvic fxs. Ortho was consulted and recommend WBAT, follow-up with Dr Guyann Leitz. Patient to be admitted for pain control.  Bedside handoff with ED RN Rachel Clark.    Rachel Clark  Trauma Response RN  Please call TRN at [phone] for further assistance.

## 2023-10-17 NOTE — ED Provider Notes (Signed)
 [place] EMERGENCY DEPARTMENT AT [facility] Provider Note   CSN: [id number] Arrival date & time: 10/17/23  9528     History  No chief complaint on file.   Rachel Clark is a 73 y.o. female.  The history is provided by the patient and medical records. No language interpreter was used.  Trauma Mechanism of injury: Fall Injury location: pelvis Injury location detail: L hip and R hip Incident location: outdoors Arrived directly from scene: yes   Current symptoms:      Associated symptoms:            Reports headache (mild in occiput.) and neck pain.            Denies abdominal pain, back pain, chest pain, nausea and vomiting.       Home Medications Prior to Admission medications   Not on File      Allergies    Patient has no allergy information on record.    Review of Systems   Review of Systems  Constitutional:  Negative for chills, fatigue and fever.  HENT:  Negative for congestion.   Eyes:  Negative for visual disturbance.  Respiratory:  Negative for cough, chest tightness, shortness of breath and wheezing.   Cardiovascular:  Negative for chest pain.  Gastrointestinal:  Negative for abdominal pain, constipation, diarrhea, nausea and vomiting.  Genitourinary:  Negative for dysuria.  Musculoskeletal:  Positive for neck pain. Negative for back pain and neck stiffness.  Skin:  Positive for color change and wound (bruising, abrasions). Negative for rash.  Neurological:  Positive for headaches (mild in occiput.).  Psychiatric/Behavioral:  Negative for agitation and confusion.   All other systems reviewed and are negative.   Physical Exam Updated Vital Signs BP (!) 144/70   Pulse 77   Resp 18   Ht 5\' 8"  (1.727 m)   Wt 80.1 kg   SpO2 99%   BMI 26.85 kg/m  Physical Exam Vitals and nursing note reviewed.  Constitutional:      General: She is not in acute distress.    Appearance: She is well-developed. She is not ill-appearing, toxic-appearing  or diaphoretic.  HENT:     Head: Normocephalic and atraumatic.     Nose: No congestion or rhinorrhea.     Mouth/Throat:     Mouth: Mucous membranes are moist.  Eyes:     Extraocular Movements: Extraocular movements intact.     Conjunctiva/sclera: Conjunctivae normal.     Pupils: Pupils are equal, round, and reactive to light.  Cardiovascular:     Rate and Rhythm: Normal rate and regular rhythm.     Heart sounds: No murmur heard. Pulmonary:     Effort: Pulmonary effort is normal. No respiratory distress.     Breath sounds: Normal breath sounds. No wheezing, rhonchi or rales.  Chest:     Chest wall: No tenderness.  Abdominal:     General: Abdomen is flat.     Palpations: Abdomen is soft.     Tenderness: There is no abdominal tenderness. There is no right CVA tenderness, left CVA tenderness, guarding or rebound.  Musculoskeletal:        General: Tenderness present. No swelling.     Cervical back: Neck supple. Tenderness present.  Skin:    General: Skin is warm and dry.     Capillary Refill: Capillary refill takes less than 2 seconds.     Findings: Bruising present. No erythema or rash.  Neurological:     General:  No focal deficit present.     Mental Status: She is alert.     Sensory: No sensory deficit.     Motor: No weakness.  Psychiatric:        Mood and Affect: Mood normal.     ED Results / Procedures / Treatments   Labs (all labs ordered are listed, but only abnormal results are displayed) Labs Reviewed  COMPREHENSIVE METABOLIC PANEL WITH GFR - Abnormal; Notable for the following components:      Result Value   Glucose, Bld 119 (*)    Calcium  8.3 (*)    Total Protein 5.8 (*)    Albumin 3.0 (*)    All other components within normal limits  CBC - Abnormal; Notable for the following components:   WBC 13.5 (*)    RBC 3.26 (*)    Hemoglobin 8.9 (*)    HCT 28.8 (*)    RDW 21.8 (*)    All other components within normal limits  I-STAT CHEM 8, ED - Abnormal; Notable  for the following components:   Glucose, Bld 114 (*)    Calcium , Ion 1.06 (*)    Hemoglobin 9.5 (*)    HCT 28.0 (*)    All other components within normal limits  ETHANOL  PROTIME-INR  TSH  URINALYSIS, W/ REFLEX TO CULTURE (INFECTION SUSPECTED)  CBC  I-STAT CG4 LACTIC ACID, ED  SAMPLE TO BLOOD BANK    EKG EKG Interpretation Date/Time:  Wednesday October 17 2023 09:43:28 EDT Ventricular Rate:  70 PR Interval:  225 QRS Duration:  114 QT Interval:  403 QTC Calculation: 435 R Axis:   59  Text Interpretation: Sinus rhythm Ventricular premature complex Prolonged PR interval Borderline intraventricular conduction delay no prior ECG for comparison No STEMI Confirmed by Wynell Heath ([id number]) on 10/17/2023 10:55:07 AM  Radiology DG Chest Port 1 View Result Date: 10/17/2023 CLINICAL DATA:  Found down. EXAM: PORTABLE CHEST 1 VIEW COMPARISON:  Chest radiograph dated 02/03/2023. CT chest, abdomen, and pelvis dated 10/17/2023. FINDINGS: The heart size and mediastinal contours are within normal limits. 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 Mild left basilar scarring/atelectasis. No focal consolidation, pleural effusion, or pneumothorax. No acute osseous abnormality identified. IMPRESSION: Mild left basilar scarring/atelectasis. Otherwise, no acute findings in the chest. Electronically Signed   By: Mannie Seek M.D.   On: 10/17/2023 10:13   DG Pelvis Portable Result Date: 10/17/2023 CLINICAL DATA:  Found down. EXAM: PORTABLE PELVIS 1-2 VIEWS COMPARISON:  CT chest, abdomen, and pelvis dated 10/17/2023 at 9:22 a.m. FINDINGS: Comminuted minimally displaced fracture of the left pubic body with extension along the left superior pubic ramus is again noted. Irregularity of the right sacral ala corresponds to nondisplaced fracture of the right sacral ala, better visualized on the same-day CT. Femoral heads are seated within the acetabulum. No dislocation. Mild  degenerative changes of the bilateral hips. Degenerative changes of the visualized lower lumbar spine. Vascular calcifications are present. IMPRESSION: 1. Comminuted minimally displaced fracture of the left pubic body with extension along the left superior pubic ramus. 2. Nondisplaced fracture of the right sacral ala is better visualized on the same-day CT. Electronically Signed   By: Mannie Seek M.D.   On: 10/17/2023 10:10   CT HEAD WO CONTRAST ( ) Result Date: 10/17/2023 CLINICAL DATA:  Provided history: Polytrauma, blunt. Additional history provided: Fall. Hypotensive. EXAM: CT HEAD WITHOUT CONTRAST CT CERVICAL SPINE WITHOUT CONTRAST TECHNIQUE: Multidetector CT imaging of the head  and cervical spine was performed following the standard protocol without intravenous contrast. Multiplanar CT image reconstructions of the cervical spine were also generated. RADIATION DOSE REDUCTION: This exam was performed according to the departmental dose-optimization program which includes automated exposure control, adjustment of the mA and/or kV according to patient size and/or use of iterative reconstruction technique. COMPARISON:  CT head 08/31/2023. Thyroid  ultrasound 11/12/2012. Thyroid  ultrasound 05/05/2013. FINDINGS: CT HEAD FINDINGS Brain: Generalized cerebral atrophy. Focus of chronic encephalomalacia/gliosis again demonstrated within the anterior left temporal lobe. Chronic lacunar infarct again demonstrated within the left subinsular white matter. Mild-to-moderate patchy and ill-defined hypoattenuation elsewhere within the cerebral white matter, nonspecific but compatible chronic small vessel disease. There is no acute intracranial hemorrhage. No acute demarcated cortical infarct. No extra-axial fluid collection. No evidence of an intracranial mass. No midline shift. Vascular: No hyperdense vessel.  Atherosclerotic calcifications. Skull: No calvarial fracture or aggressive osseous lesion. Sinuses/Orbits: No  mass or acute finding within the imaged orbits. No significant paranasal sinus disease at the imaged levels. CT CERVICAL SPINE FINDINGS Alignment: Nonspecific reversal of the expected cervical lordosis. 2 mm grade 1 anterolisthesis at C2-C3, C3-C4 and C4-C5. Slight grade 1 retrolisthesis at C7-T1 and T1-T2. Skull base and vertebrae: The basion-dental and atlanto-dental intervals are maintained.No evidence of acute fracture to the cervical spine. Soft tissues and spinal canal: No prevertebral fluid or swelling. No visible canal hematoma. Disc levels: Cervical spondylosis with multilevel disc space narrowing, disc bulges/central disc protrusions, posterior disc osteophyte complexes, uncovertebral hypertrophy and facet arthropathy. Disc space narrowing is advanced at C5-C6 and C6-C7 (with possible bridging osseous fusion across the disc spaces at these levels). Multilevel spinal canal narrowing. Most notably at C5-C6, a posterior disc osteophyte complex results in mild-to-moderate spinal canal narrowing. Multilevel bony neural foraminal narrowing. Multilevel ventral osteophytes. Degenerative changes also present at the C1-C2 articulation. Upper chest: Separately reported on same day CT chest/abdomen/pelvis. Other: Known thyroid  nodules, better delineated on the prior thyroid  ultrasound of 05/05/2013. By report, a dominant right thyroid  lobe nodule was previously biopsied. Correlate with pathology from this prior procedure. No evidence of an acute intracranial abnormality. No evidence of an acute cervical spine fracture. These results discussed in person at the time of interpretation on 10/17/2023 at 9:40 am with provider Dr. Aniceto Barley, who verbally acknowledged these results. IMPRESSION: CT head: 1. No evidence of an acute intracranial abnormality. 2. Focus of chronic encephalomalacia/gliosis within the anterior left temporal lobe. 3. 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 4. Background  parenchymal atrophy and chronic small vessel disease. CT cervical spine: 1. No evidence of an acute cervical spine fracture. 2. Nonspecific reversal of the expected cervical lordosis. 3. Mild grade 1 anterolisthesis at C2-C3, C3-C4, C4-C5, C7-T1 and T1-T2. 4. Cervical spondylosis as described within the body of the report. 5. Possible vertebral body ankylosis at C5-C6 and C6-C7. Electronically Signed   By: Bascom Lily D.O.   On: 10/17/2023 09:46   CT Cervical Spine Wo Contrast Result Date: 10/17/2023 CLINICAL DATA:  Provided history: Polytrauma, blunt. Additional history provided: Fall. Hypotensive. EXAM: CT HEAD WITHOUT CONTRAST CT CERVICAL SPINE WITHOUT CONTRAST TECHNIQUE: Multidetector CT imaging of the head and cervical spine was performed following the standard protocol without intravenous contrast. Multiplanar CT image reconstructions of the cervical spine were also generated. RADIATION DOSE REDUCTION: This exam was performed according to the departmental dose-optimization program which includes automated exposure control, adjustment of the mA and/or kV according to patient size and/or use of iterative reconstruction technique.  COMPARISON:  CT head 08/31/2023. Thyroid  ultrasound 11/12/2012. Thyroid  ultrasound 05/05/2013. FINDINGS: CT HEAD FINDINGS Brain: Generalized cerebral atrophy. Focus of chronic encephalomalacia/gliosis again demonstrated within the anterior left temporal lobe. Chronic lacunar infarct again demonstrated within the left subinsular white matter. Mild-to-moderate patchy and ill-defined hypoattenuation elsewhere within the cerebral white matter, nonspecific but compatible chronic small vessel disease. There is no acute intracranial hemorrhage. No acute demarcated cortical infarct. No extra-axial fluid collection. No evidence of an intracranial mass. No midline shift. Vascular: No hyperdense vessel.  Atherosclerotic calcifications. Skull: No calvarial fracture or aggressive osseous lesion.  Sinuses/Orbits: No mass or acute finding within the imaged orbits. No significant paranasal sinus disease at the imaged levels. CT CERVICAL SPINE FINDINGS Alignment: Nonspecific reversal of the expected cervical lordosis. 2 mm grade 1 anterolisthesis at C2-C3, C3-C4 and C4-C5. Slight grade 1 retrolisthesis at C7-T1 and T1-T2. Skull base and vertebrae: The basion-dental and atlanto-dental intervals are maintained.No evidence of acute fracture to the cervical spine. Soft tissues and spinal canal: No prevertebral fluid or swelling. No visible canal hematoma. Disc levels: Cervical spondylosis with multilevel disc space narrowing, disc bulges/central disc protrusions, posterior disc osteophyte complexes, uncovertebral hypertrophy and facet arthropathy. Disc space narrowing is advanced at C5-C6 and C6-C7 (with possible bridging osseous fusion across the disc spaces at these levels). Multilevel spinal canal narrowing. Most notably at C5-C6, a posterior disc osteophyte complex results in mild-to-moderate spinal canal narrowing. Multilevel bony neural foraminal narrowing. Multilevel ventral osteophytes. Degenerative changes also present at the C1-C2 articulation. Upper chest: Separately reported on same day CT chest/abdomen/pelvis. Other: Known thyroid  nodules, better delineated on the prior thyroid  ultrasound of 05/05/2013. By report, a dominant right thyroid  lobe nodule was previously biopsied. Correlate with pathology from this prior procedure. No evidence of an acute intracranial abnormality. No evidence of an acute cervical spine fracture. These results discussed in person at the time of interpretation on 10/17/2023 at 9:40 am with provider Dr. Aniceto Barley, who verbally acknowledged these results. IMPRESSION: CT head: 1. No evidence of an acute intracranial abnormality. 2. Focus of chronic encephalomalacia/gliosis within the anterior left temporal lobe. 3. 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 4.  Background parenchymal atrophy and chronic small vessel disease. CT cervical spine: 1. No evidence of an acute cervical spine fracture. 2. Nonspecific reversal of the expected cervical lordosis. 3. Mild grade 1 anterolisthesis at C2-C3, C3-C4, C4-C5, C7-T1 and T1-T2. 4. Cervical spondylosis as described within the body of the report. 5. Possible vertebral body ankylosis at C5-C6 and C6-C7. Electronically Signed   By: Bascom Lily D.O.   On: 10/17/2023 09:46   CT CHEST ABDOMEN PELVIS W CONTRAST Result Date: 10/17/2023 CLINICAL DATA:  Found down, hypotension, confusion EXAM: CT CHEST, ABDOMEN, AND PELVIS WITH CONTRAST TECHNIQUE: Multidetector CT imaging of the chest, abdomen and pelvis was performed following the standard protocol during bolus administration of intravenous contrast. RADIATION DOSE REDUCTION: This exam was performed according to the departmental dose-optimization program which includes automated exposure control, adjustment of the mA and/or kV according to patient size and/or use of iterative reconstruction technique. CONTRAST:  80mL OMNIPAQUE  IOHEXOL  350 MG/ML SOLN COMPARISON:  11/03/2014 FINDINGS: CT CHEST FINDINGS Cardiovascular: The heart is unremarkable without pericardial effusion. Dual lead cardiac pacer is identified. No evidence of thoracic aortic aneurysm or dissection. Postsurgical changes from prior CABG. Atherosclerosis of the aorta and native coronary vessels. No evidence of mediastinal injury. Mediastinum/Nodes: No enlarged mediastinal, hilar, or axillary lymph nodes. Thyroid  gland, trachea, and esophagus demonstrate no  significant findings. Lungs/Pleura: Scattered areas of parenchymal lung scarring are seen at the lung apices and left lung base. No acute airspace disease, effusion, or pneumothorax. Central airways are patent. Musculoskeletal: There are no acute displaced fractures. Reconstructed images demonstrate no additional findings. CT ABDOMEN PELVIS FINDINGS Hepatobiliary: No  hepatic injury or perihepatic hematoma. Gallbladder is unremarkable. Pancreas: Unremarkable. No pancreatic ductal dilatation or surrounding inflammatory changes. Spleen: No splenic injury or perisplenic hematoma. Adrenals/Urinary Tract: No evidence of acute renal injury. Kidneys enhance normally and symmetrically. No urinary tract calculi or obstructive uropathy. Right adrenal is unremarkable. Thickening of the body of the left adrenal gland measuring 1 cm, with an attenuation of 40 HU, nonspecific but not significantly changed since prior study and likely reflecting adenoma. The bladder is moderately distended, without focal abnormality. No filling defects. Stomach/Bowel: No bowel obstruction or ileus. Moderate retained stool throughout the colon, most pronounced in the mid transverse colon at the site of prior partial colon resection and reanastomosis. No bowel wall thickening or inflammatory change. Vascular/Lymphatic: Aortic atherosclerosis. No enlarged abdominal or pelvic lymph nodes. Reproductive: Status post hysterectomy. No adnexal masses. Other: No free intraperitoneal fluid or free intraperitoneal gas. No abdominal wall hernia. Musculoskeletal: There is a comminuted fracture through the left side pubic symphysis, extending to the left superior pubic ramus. Mild displacement of the fracture fragments, with near anatomic alignment. Soft tissue edema is seen along the left pelvic sidewall adjacent to the fracture site. No contrast extravasation to suggest active hemorrhage. There is also a nondisplaced fracture through the right sacral ala, with mild adjacent soft tissue edema. No contrast extravasation to suggest active hemorrhage. No other acute bony abnormalities. Reconstructed images demonstrate no additional findings. IMPRESSION: 1. Comminuted and minimally displaced left parasymphyseal fracture extending into the left superior pubic ramus, with soft tissue edema along the left pelvic sidewall. No  contrast extravasation to suggest active hemorrhage. No evidence of acute bladder injury. 2. Nondisplaced fracture through the right sacral ala, with minimal adjacent soft tissue edema. No contrast extravasation to suggest active hemorrhage. 3. Otherwise no acute intrathoracic or intra-abdominal trauma. 4.  Aortic Atherosclerosis (ICD10-I70.0). Critical Value/emergent results were called by telephone at the time of interpretation on 10/17/2023 at 9:34 am to provider DR Aniceto Barley, who verbally acknowledged these results. Electronically Signed   By: Bobbye Burrow M.D.   On: 10/17/2023 09:40    Procedures Procedures    Medications Ordered in ED Medications  acetaminophen  (TYLENOL ) tablet 1,000 mg (1,000 mg Oral Given 10/17/23 1258)  morphine  (PF) 4 MG/ML injection 4 mg (has no administration in time range)  methocarbamol  (ROBAXIN ) tablet 500 mg (500 mg Oral Given 10/17/23 1258)    Or  methocarbamol  (ROBAXIN ) injection 500 mg ( Intravenous See Alternative 10/17/23 1258)  docusate sodium  (COLACE) capsule 100 mg (has no administration in time range)  polyethylene glycol (MIRALAX  / GLYCOLAX ) packet 17 g (has no administration in time range)  ondansetron  (ZOFRAN -ODT) disintegrating tablet 4 mg (has no administration in time range)    Or  ondansetron  (ZOFRAN ) injection 4 mg (has no administration in time range)  metoprolol  tartrate (LOPRESSOR ) injection 5 mg (has no administration in time range)  hydrALAZINE  (APRESOLINE ) injection 10 mg (has no administration in time range)  enoxaparin  (LOVENOX ) injection 30 mg (has no administration in time range)  lactated ringers  infusion (has no administration in time range)  oxyCODONE  (Oxy IR/ROXICODONE ) immediate release tablet 2.5-5 mg (has no administration in time range)  iohexol  (OMNIPAQUE ) 350 MG/ML injection 80 mL (80  mLs Intravenous Contrast Given 10/17/23 0911)    ED Course/ Medical Decision Making/ A&P                                 Medical Decision  Making Amount and/or Complexity of Data Reviewed Labs: ordered. Radiology: ordered.  Risk Prescription drug management. Decision regarding hospitalization.    JUDIANNE SEIPLE is a 73 y.o. female with unknown past medical history who presents as a level 1 trauma for fall with hypotension.  According to EMS report, patient had gone outside today and had a fall but does not know what happened.  Patient's blood pressure found to be 90 with first responding fire department but then it dropped to 80 systolic with EMS.  Patient next visit level 1 trauma for fall on thinners with hypotension.  Patient primarily complaining of pain in both hips but she does not member exactly what happened.  On arrival, airway is intact.  Breath sounds are equal bilaterally initial blood pressure has improved into the 140s systolic.  On my exam chest and abdomen not tender however she did have tenderness and bruising over both hips.  Patient otherwise had intact sensation and strength and pulses in extremities.  Portable chest and pelvis x-rays were obtained and she will also get CT imaging to further look for injuries.  Will get a CT head and cervical spine as she was complaining of some pain in her head and her neck.  Will get other screening labs for other causes of hypotension including urinalysis and a TSH.  Imaging well to look for pneumonia although patient is denying other complaints.  Will get EKG as well is unclear if this was a fall mechanically or syncope.  Anticipate reassessment after workup is further.  Trauma will admit for further management.         Final Clinical Impression(s) / ED Diagnoses Final diagnoses:  Closed nondisplaced fracture of pelvis, unspecified part of pelvis, initial encounter [facility])  Fall, initial encounter    Clinical Impression: 1. Closed nondisplaced fracture of pelvis, unspecified part of pelvis, initial encounter (HCC)   2. Fall, initial encounter      Disposition: Admit  This note was prepared with assistance of Conservation officer, historic buildings. Occasional wrong-word or sound-a-like substitutions may have occurred due to the inherent limitations of voice recognition software.      Feras Gardella, Marine Sia, MD 10/17/23 [phone]

## 2023-10-17 NOTE — Consult Note (Signed)
 Reason for Consult:Pelvic fxs Referring Physician: Hassell Linsey Time called: 1031 Time at bedside: 1056   Rachel Clark is an 73 y.o. female.  HPI: Rachel Clark was coming back from the kitchen when she fell, landing on her butt. She was helped to a chair but then couldn't walk after and EMS was called. She was brought to the ED where workup showed pelvic fxs and orthopedic surgery was consulted. She was brought in as a level 1 trauma activation 2/2 HoTN. She lives at home with her husband and doesn't use any assistive devices to ambulate.  No past medical history on file.  No family history on file.  Social History:  has no history on file for tobacco use, alcohol use, and drug use.  Allergies: Not on File  Medications: I have reviewed the patient's current medications.  Results for orders placed or performed during the hospital encounter of 10/17/23 (from the past 48 hours)  Ethanol     Status: None   Collection Time: 10/17/23  8:55 AM  Result Value Ref Range   Alcohol, Ethyl (B) <10 <10 mg/dL    Comment: (NOTE) Lowest detectable limit for serum alcohol is 10 mg/dL.  For medical purposes only. Performed at [facility], [address], [place], [place] [address]   Comprehensive metabolic panel     Status: Abnormal   Collection Time: 10/17/23  9:30 AM  Result Value Ref Range   Sodium 137 135 - 145 mmol/L   Potassium 4.3 3.5 - 5.1 mmol/L   Chloride 104 98 - 111 mmol/L   CO2 24 22 - 32 mmol/L   Glucose, Bld 119 (H) 70 - 99 mg/dL    Comment: Glucose reference range applies only to samples taken after fasting for at least 8 hours.   BUN 15 8 - 23 mg/dL   Creatinine, Ser 9.56 0.44 - 1.00 mg/dL   Calcium  8.3 (L) 8.9 - 10.3 mg/dL   Total Protein 5.8 (L) 6.5 - 8.1 g/dL   Albumin 3.0 (L) 3.5 - 5.0 g/dL   AST 19 15 - 41 U/L   ALT 12 0 - 44 U/L   Alkaline Phosphatase 47 38 - 126 U/L   Total Bilirubin 0.4 0.0 - 1.2 mg/dL   GFR, Estimated >21 >30 mL/min    Comment:  (NOTE) Calculated using the CKD-EPI Creatinine Equation (2021)    Anion gap 9 5 - 15    Comment: Performed at [facility], [address], [place], [place] [address]  CBC     Status: Abnormal   Collection Time: 10/17/23  9:30 AM  Result Value Ref Range   WBC 13.5 (H) 4.0 - 10.5 K/uL   RBC 3.26 (L) 3.87 - 5.11 MIL/uL   Hemoglobin 8.9 (L) 12.0 - 15.0 g/dL   HCT 46.9 (L) 62.9 - 52.8 %   MCV 88.3 80.0 - 100.0 fL   MCH 27.3 26.0 - 34.0 pg   MCHC 30.9 30.0 - 36.0 g/dL   RDW 41.3 (H) 24.4 - 01.0 %   Platelets 357 150 - 400 K/uL   nRBC 0.0 0.0 - 0.2 %    Comment: Performed at [facility], [address], [place], [place] [address]  Protime-INR     Status: None   Collection Time: 10/17/23  9:30 AM  Result Value Ref Range   Prothrombin Time 14.9 11.4 - 15.2 seconds   INR 1.2 0.8 - 1.2    Comment: (NOTE) INR goal varies  based on device and disease states. Performed at [facility], [address], [place], [place] [address]   Sample to Blood Bank     Status: None   Collection Time: 10/17/23  9:30 AM  Result Value Ref Range   Blood Bank Specimen SAMPLE AVAILABLE FOR TESTING    Sample Expiration      10/20/2023,2359 Performed at [facility], [address], [place], [place] [address]   I-Stat Lactic Acid, ED     Status: None   Collection Time: 10/17/23  9:42 AM  Result Value Ref Range   Lactic Acid, Venous 1.5 0.5 - 1.9 mmol/L  I-Stat Chem 8, ED     Status: Abnormal   Collection Time: 10/17/23  9:43 AM  Result Value Ref Range   Sodium 136 135 - 145 mmol/L   Potassium 4.2 3.5 - 5.1 mmol/L   Chloride 103 98 - 111 mmol/L   BUN 16 8 - 23 mg/dL   Creatinine, Ser 0.98 0.44 - 1.00 mg/dL   Glucose, Bld 119 (H) 70 - 99 mg/dL    Comment: Glucose reference range applies only to samples taken after fasting for at least 8 hours.   Calcium , Ion 1.06 (L) 1.15 - 1.40 mmol/L   TCO2 23 22 - 32 mmol/L   Hemoglobin 9.5 (L) 12.0 - 15.0 g/dL   HCT 14.7 (L) 82.9 - 56.2 %     DG Chest Port 1 View Result Date: 10/17/2023 CLINICAL DATA:  Found down. EXAM: PORTABLE CHEST 1 VIEW COMPARISON:  Chest radiograph dated 02/03/2023. CT chest, abdomen, and pelvis dated 10/17/2023. FINDINGS: The heart size and mediastinal contours are within normal limits. 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 Mild left basilar scarring/atelectasis. No focal consolidation, pleural effusion, or pneumothorax. No acute osseous abnormality identified. IMPRESSION: Mild left basilar scarring/atelectasis. Otherwise, no acute findings in the chest. Electronically Signed   By: Mannie Seek M.D.   On: 10/17/2023 10:13   DG Pelvis Portable Result Date: 10/17/2023 CLINICAL DATA:  Found down. EXAM: PORTABLE PELVIS 1-2 VIEWS COMPARISON:  CT chest, abdomen, and pelvis dated 10/17/2023 at 9:22 a.m. FINDINGS: Comminuted minimally displaced fracture of the left pubic body with extension along the left superior pubic ramus is again noted. Irregularity of the right sacral ala corresponds to nondisplaced fracture of the right sacral ala, better visualized on the same-day CT. Femoral heads are seated within the acetabulum. No dislocation. Mild degenerative changes of the bilateral hips. Degenerative changes of the visualized lower lumbar spine. Vascular calcifications are present. IMPRESSION: 1. Comminuted minimally displaced fracture of the left pubic body with extension along the left superior pubic ramus. 2. Nondisplaced fracture of the right sacral ala is better visualized on the same-day CT. Electronically Signed   By: Mannie Seek M.D.   On: 10/17/2023 10:10   CT HEAD WO CONTRAST ( ) Result Date: 10/17/2023 CLINICAL DATA:  Provided history: Polytrauma, blunt. Additional history provided: Fall. Hypotensive. EXAM: CT HEAD WITHOUT CONTRAST CT CERVICAL SPINE WITHOUT CONTRAST TECHNIQUE: Multidetector CT imaging of the head and cervical spine was performed following  the standard protocol without intravenous contrast. Multiplanar CT image reconstructions of the cervical spine were also generated. RADIATION DOSE REDUCTION: This exam was performed according to the departmental dose-optimization program which includes automated exposure control, adjustment of the mA and/or kV according to patient size and/or use of iterative reconstruction technique. COMPARISON:  CT head 08/31/2023. Thyroid  ultrasound 11/12/2012. Thyroid  ultrasound 05/05/2013.  FINDINGS: CT HEAD FINDINGS Brain: Generalized cerebral atrophy. Focus of chronic encephalomalacia/gliosis again demonstrated within the anterior left temporal lobe. Chronic lacunar infarct again demonstrated within the left subinsular white matter. Mild-to-moderate patchy and ill-defined hypoattenuation elsewhere within the cerebral white matter, nonspecific but compatible chronic small vessel disease. There is no acute intracranial hemorrhage. No acute demarcated cortical infarct. No extra-axial fluid collection. No evidence of an intracranial mass. No midline shift. Vascular: No hyperdense vessel.  Atherosclerotic calcifications. Skull: No calvarial fracture or aggressive osseous lesion. Sinuses/Orbits: No mass or acute finding within the imaged orbits. No significant paranasal sinus disease at the imaged levels. CT CERVICAL SPINE FINDINGS Alignment: Nonspecific reversal of the expected cervical lordosis. 2 mm grade 1 anterolisthesis at C2-C3, C3-C4 and C4-C5. Slight grade 1 retrolisthesis at C7-T1 and T1-T2. Skull base and vertebrae: The basion-dental and atlanto-dental intervals are maintained.No evidence of acute fracture to the cervical spine. Soft tissues and spinal canal: No prevertebral fluid or swelling. No visible canal hematoma. Disc levels: Cervical spondylosis with multilevel disc space narrowing, disc bulges/central disc protrusions, posterior disc osteophyte complexes, uncovertebral hypertrophy and facet arthropathy. Disc  space narrowing is advanced at C5-C6 and C6-C7 (with possible bridging osseous fusion across the disc spaces at these levels). Multilevel spinal canal narrowing. Most notably at C5-C6, a posterior disc osteophyte complex results in mild-to-moderate spinal canal narrowing. Multilevel bony neural foraminal narrowing. Multilevel ventral osteophytes. Degenerative changes also present at the C1-C2 articulation. Upper chest: Separately reported on same day CT chest/abdomen/pelvis. Other: Known thyroid  nodules, better delineated on the prior thyroid  ultrasound of 05/05/2013. By report, a dominant right thyroid  lobe nodule was previously biopsied. Correlate with pathology from this prior procedure. No evidence of an acute intracranial abnormality. No evidence of an acute cervical spine fracture. These results discussed in person at the time of interpretation on 10/17/2023 at 9:40 am with provider Dr. Aniceto Barley, who verbally acknowledged these results. IMPRESSION: CT head: 1. No evidence of an acute intracranial abnormality. 2. Focus of chronic encephalomalacia/gliosis within the anterior left temporal lobe. 3. 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 4. Background parenchymal atrophy and chronic small vessel disease. CT cervical spine: 1. No evidence of an acute cervical spine fracture. 2. Nonspecific reversal of the expected cervical lordosis. 3. Mild grade 1 anterolisthesis at C2-C3, C3-C4, C4-C5, C7-T1 and T1-T2. 4. Cervical spondylosis as described within the body of the report. 5. Possible vertebral body ankylosis at C5-C6 and C6-C7. Electronically Signed   By: Bascom Lily D.O.   On: 10/17/2023 09:46   CT Cervical Spine Wo Contrast Result Date: 10/17/2023 CLINICAL DATA:  Provided history: Polytrauma, blunt. Additional history provided: Fall. Hypotensive. EXAM: CT HEAD WITHOUT CONTRAST CT CERVICAL SPINE WITHOUT CONTRAST TECHNIQUE: Multidetector CT imaging of the head and cervical spine was  performed following the standard protocol without intravenous contrast. Multiplanar CT image reconstructions of the cervical spine were also generated. RADIATION DOSE REDUCTION: This exam was performed according to the departmental dose-optimization program which includes automated exposure control, adjustment of the mA and/or kV according to patient size and/or use of iterative reconstruction technique. COMPARISON:  CT head 08/31/2023. Thyroid  ultrasound 11/12/2012. Thyroid  ultrasound 05/05/2013. FINDINGS: CT HEAD FINDINGS Brain: Generalized cerebral atrophy. Focus of chronic encephalomalacia/gliosis again demonstrated within the anterior left temporal lobe. Chronic lacunar infarct again demonstrated within the left subinsular white matter. Mild-to-moderate patchy and ill-defined hypoattenuation elsewhere within the cerebral white matter, nonspecific but compatible chronic small vessel disease. There is no acute intracranial hemorrhage. No acute  demarcated cortical infarct. No extra-axial fluid collection. No evidence of an intracranial mass. No midline shift. Vascular: No hyperdense vessel.  Atherosclerotic calcifications. Skull: No calvarial fracture or aggressive osseous lesion. Sinuses/Orbits: No mass or acute finding within the imaged orbits. No significant paranasal sinus disease at the imaged levels. CT CERVICAL SPINE FINDINGS Alignment: Nonspecific reversal of the expected cervical lordosis. 2 mm grade 1 anterolisthesis at C2-C3, C3-C4 and C4-C5. Slight grade 1 retrolisthesis at C7-T1 and T1-T2. Skull base and vertebrae: The basion-dental and atlanto-dental intervals are maintained.No evidence of acute fracture to the cervical spine. Soft tissues and spinal canal: No prevertebral fluid or swelling. No visible canal hematoma. Disc levels: Cervical spondylosis with multilevel disc space narrowing, disc bulges/central disc protrusions, posterior disc osteophyte complexes, uncovertebral hypertrophy and facet  arthropathy. Disc space narrowing is advanced at C5-C6 and C6-C7 (with possible bridging osseous fusion across the disc spaces at these levels). Multilevel spinal canal narrowing. Most notably at C5-C6, a posterior disc osteophyte complex results in mild-to-moderate spinal canal narrowing. Multilevel bony neural foraminal narrowing. Multilevel ventral osteophytes. Degenerative changes also present at the C1-C2 articulation. Upper chest: Separately reported on same day CT chest/abdomen/pelvis. Other: Known thyroid  nodules, better delineated on the prior thyroid  ultrasound of 05/05/2013. By report, a dominant right thyroid  lobe nodule was previously biopsied. Correlate with pathology from this prior procedure. No evidence of an acute intracranial abnormality. No evidence of an acute cervical spine fracture. These results discussed in person at the time of interpretation on 10/17/2023 at 9:40 am with provider Dr. Aniceto Barley, who verbally acknowledged these results. IMPRESSION: CT head: 1. No evidence of an acute intracranial abnormality. 2. Focus of chronic encephalomalacia/gliosis within the anterior left temporal lobe. 3. 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 4. Background parenchymal atrophy and chronic small vessel disease. CT cervical spine: 1. No evidence of an acute cervical spine fracture. 2. Nonspecific reversal of the expected cervical lordosis. 3. Mild grade 1 anterolisthesis at C2-C3, C3-C4, C4-C5, C7-T1 and T1-T2. 4. Cervical spondylosis as described within the body of the report. 5. Possible vertebral body ankylosis at C5-C6 and C6-C7. Electronically Signed   By: Bascom Lily D.O.   On: 10/17/2023 09:46   CT CHEST ABDOMEN PELVIS W CONTRAST Result Date: 10/17/2023 CLINICAL DATA:  Found down, hypotension, confusion EXAM: CT CHEST, ABDOMEN, AND PELVIS WITH CONTRAST TECHNIQUE: Multidetector CT imaging of the chest, abdomen and pelvis was performed following the standard protocol  during bolus administration of intravenous contrast. RADIATION DOSE REDUCTION: This exam was performed according to the departmental dose-optimization program which includes automated exposure control, adjustment of the mA and/or kV according to patient size and/or use of iterative reconstruction technique. CONTRAST:  80mL OMNIPAQUE  IOHEXOL  350 MG/ML SOLN COMPARISON:  11/03/2014 FINDINGS: CT CHEST FINDINGS Cardiovascular: The heart is unremarkable without pericardial effusion. Dual lead cardiac pacer is identified. No evidence of thoracic aortic aneurysm or dissection. Postsurgical changes from prior CABG. Atherosclerosis of the aorta and native coronary vessels. No evidence of mediastinal injury. Mediastinum/Nodes: No enlarged mediastinal, hilar, or axillary lymph nodes. Thyroid  gland, trachea, and esophagus demonstrate no significant findings. Lungs/Pleura: Scattered areas of parenchymal lung scarring are seen at the lung apices and left lung base. No acute airspace disease, effusion, or pneumothorax. Central airways are patent. Musculoskeletal: There are no acute displaced fractures. Reconstructed images demonstrate no additional findings. CT ABDOMEN PELVIS FINDINGS Hepatobiliary: No hepatic injury or perihepatic hematoma. Gallbladder is unremarkable. Pancreas: Unremarkable. No pancreatic ductal dilatation or surrounding inflammatory changes. Spleen:  No splenic injury or perisplenic hematoma. Adrenals/Urinary Tract: No evidence of acute renal injury. Kidneys enhance normally and symmetrically. No urinary tract calculi or obstructive uropathy. Right adrenal is unremarkable. Thickening of the body of the left adrenal gland measuring 1 cm, with an attenuation of 40 HU, nonspecific but not significantly changed since prior study and likely reflecting adenoma. The bladder is moderately distended, without focal abnormality. No filling defects. Stomach/Bowel: No bowel obstruction or ileus. Moderate retained stool  throughout the colon, most pronounced in the mid transverse colon at the site of prior partial colon resection and reanastomosis. No bowel wall thickening or inflammatory change. Vascular/Lymphatic: Aortic atherosclerosis. No enlarged abdominal or pelvic lymph nodes. Reproductive: Status post hysterectomy. No adnexal masses. Other: No free intraperitoneal fluid or free intraperitoneal gas. No abdominal wall hernia. Musculoskeletal: There is a comminuted fracture through the left side pubic symphysis, extending to the left superior pubic ramus. Mild displacement of the fracture fragments, with near anatomic alignment. Soft tissue edema is seen along the left pelvic sidewall adjacent to the fracture site. No contrast extravasation to suggest active hemorrhage. There is also a nondisplaced fracture through the right sacral ala, with mild adjacent soft tissue edema. No contrast extravasation to suggest active hemorrhage. No other acute bony abnormalities. Reconstructed images demonstrate no additional findings. IMPRESSION: 1. Comminuted and minimally displaced left parasymphyseal fracture extending into the left superior pubic ramus, with soft tissue edema along the left pelvic sidewall. No contrast extravasation to suggest active hemorrhage. No evidence of acute bladder injury. 2. Nondisplaced fracture through the right sacral ala, with minimal adjacent soft tissue edema. No contrast extravasation to suggest active hemorrhage. 3. Otherwise no acute intrathoracic or intra-abdominal trauma. 4.  Aortic Atherosclerosis (ICD10-I70.0). Critical Value/emergent results were called by telephone at the time of interpretation on 10/17/2023 at 9:34 am to provider DR Aniceto Barley, who verbally acknowledged these results. Electronically Signed   By: Bobbye Burrow M.D.   On: 10/17/2023 09:40    Review of Systems  HENT:  Negative for ear discharge, ear pain, hearing loss and tinnitus.   Eyes:  Negative for photophobia and pain.   Respiratory:  Negative for cough and shortness of breath.   Cardiovascular:  Negative for chest pain.  Gastrointestinal:  Negative for abdominal pain, nausea and vomiting.  Genitourinary:  Negative for dysuria, flank pain, frequency and urgency.  Musculoskeletal:  Positive for arthralgias (Right leg) and back pain. Negative for myalgias and neck pain.  Neurological:  Negative for dizziness and headaches.  Hematological:  Does not bruise/bleed easily.  Psychiatric/Behavioral:  The patient is not nervous/anxious.    Blood pressure (!) 144/70, pulse 77, temperature 98.2 F (36.8 C), temperature source Oral, resp. rate 18, height 5\' 8"  (1.727 m), weight 80.1 kg, SpO2 99%. Physical Exam Constitutional:      General: She is not in acute distress.    Appearance: She is well-developed. She is not diaphoretic.  HENT:     Head: Normocephalic and atraumatic.  Eyes:     General: No scleral icterus.       Right eye: No discharge.        Left eye: No discharge.     Conjunctiva/sclera: Conjunctivae normal.  Cardiovascular:     Rate and Rhythm: Normal rate and regular rhythm.  Pulmonary:     Effort: Pulmonary effort is normal. No respiratory distress.  Musculoskeletal:     Cervical back: Normal range of motion.     Comments: Pelvis--no traumatic wounds or rash, no ecchymosis,  stable to manual stress, mod pain with lat compression  BLE No traumatic wounds, ecchymosis, or rash  Nontender  No knee or ankle effusion  Knee stable to varus/ valgus and anterior/posterior stress  Sens DPN, SPN, TN intact  Motor EHL, ext, flex, evers 5/5  DP 2+, PT 2+, No significant edema  Skin:    General: Skin is warm and dry.  Neurological:     Mental Status: She is alert.  Psychiatric:        Mood and Affect: Mood normal.        Behavior: Behavior normal.    Assessment/Plan: Pelvic fxs including right sacral ala, left sup/inf pubic rami, and Cx1 -- Plan non-operative management with WBAT BLE. F/u with  Dr. Guyann Leitz in 3 weeks.    Georganna Kin, PA-C Orthopedic Surgery [phone] 10/17/2023, 11:02 AM

## 2023-10-17 NOTE — TOC CAGE-AID Note (Signed)
 Transition of Care [facility]) - CAGE-AID Screening  Patient Details  Name: Rachel Clark MRN: [id number] Date of Birth: 1950/07/24  Clinical Narrative:  Patient endorses occasional alcohol use, approximately 1 drink/week. Patient denies any illicit drug use. Patient is a former smoker, no current smoking or vape use. Patient denies need for substance abuse resources at this time.  CAGE-AID Screening:   Have You Ever Felt You Ought to Cut Down on Your Drinking or Drug Use?: No Have People Annoyed You By Critizing Your Drinking Or Drug Use?: No Have You Felt Bad Or Guilty About Your Drinking Or Drug Use?: No Have You Ever Had a Drink or Used Drugs First Thing In The Morning to Steady Your Nerves or to Get Rid of a Hangover?: No CAGE-AID Score: 0  Substance Abuse Education Offered: No

## 2023-10-17 NOTE — Progress Notes (Signed)
 Responded to page to support pt that experienced a fall. Provided emotional and spiritual support.  Chaplain available as needed.  Anton Baton, [place], [facility], Pager [phone]

## 2023-10-17 NOTE — ED Triage Notes (Signed)
 Pt BIB by EMS for possible syncope episode. Per EMS, pt was found outside. Pt unable to recall event and mildly confused on arrival to ED, keeps asking about husband. Hypotensive en route, no sign of external uncontrolled hemorrhage upon arrival. C-collar in place.

## 2023-10-17 NOTE — Progress Notes (Signed)
 Orthopedic Tech Progress Note Patient Details:  Rachel Clark [birth year] [id number]  Level 1 trauma   Patient ID: Rachel Clark, female   DOB: 1951-02-21, 73 y.o.   MRN: [id number]  Kermitt Pedlar 10/17/2023, 11:08 AM

## 2023-10-17 NOTE — H&P (Signed)
   TRAUMA H&P  10/17/2023, 9:08 AM   Chief Complaint: Level 1 trauma activation for fal, hypotension  Primary Survey:  ABC's intact on arrival  The patient is an 73 y.o. female.   HPI: 73F s/p GLF. Reports not hitting her head and denies LOC.   No past medical history on file.  No pertinent family history.  Social History:  has no history on file for tobacco use, alcohol use, and drug use.     Allergies: Not on File  Medications: reviewed  No results found for this or any previous visit (from the past 48 hours).  No results found.  ROS 10 point review of systems is negative except as listed above in HPI.  Blood pressure (!) 144/70, pulse 77, resp. rate 18, height 5\' 8"  (1.727 m), weight 80.1 kg, SpO2 99%.  Secondary Survey:  GCS: E(4)//V(5)//M(6) Constitutional: well-developed, well-nourished Skull: normocephalic, atraumatic Eyes: pupils equal, round, reactive to light, 2mm b/l, moist conjunctiva Face/ENT: midface stable without deformity, normal  dentition, external inspection of ears and nose normal, hearing intact  Oropharynx: normal oropharyngeal mucosa, no blood Neck: no thyromegaly, trachea midline, c-collar applied in TB, + midline cervical tenderness to palpation, no C-spine stepoffs Chest: breath sounds equal bilaterally, normal  respiratory effort, no midline or lateral chest wall tenderness to palpation/deformity Abdomen: soft, NT, no bruising, no hepatosplenomegaly FAST: not performed Pelvis: stable, TTP of B hips, bruising of L hip, bruising and hematoma of R hip GU: normal female genitalia Back: no wounds, no T/L spine TTP, no T/L spine stepoffs Rectal: deferred Extremities: 2+  radial and pedal pulses bilaterally, intact motor and sensation of bilateral UE and LE, no peripheral edema MSK: unable to assess gait/station, no clubbing/cyanosis of fingers/toes, normal ROM of all four extremities Skin: warm, dry, no rashes  CXR in TB: sternal wires, no  PTX/HTX Pelvis XR in TB: unremarkable    Assessment/Plan: Plan GLF  Pelvic fxs including right sacral ala, left sup/inf pubic rami - ortho c/s, Dr. Guyann Leitz, WBAT Hypotension - acceptable BPs since arrival, trend hgb  FEN - regular diet DVT - SCDs, LMWH Dispo - Admit to inpatient--step-down   Anda Bamberg, MD General and Trauma Surgery [facility] Surgery

## 2023-10-17 NOTE — ED Notes (Signed)
 Pt transported to CT with Trauma RN and MD.

## 2023-10-17 NOTE — ED Notes (Signed)
 CCMD called and notified

## 2023-10-18 LAB — BASIC METABOLIC PANEL WITH GFR
Anion gap: 8 (ref 5–15)
BUN: 11 mg/dL (ref 8–23)
CO2: 22 mmol/L (ref 22–32)
Calcium: 8.4 mg/dL — ABNORMAL LOW (ref 8.9–10.3)
Chloride: 104 mmol/L (ref 98–111)
Creatinine, Ser: 0.66 mg/dL (ref 0.44–1.00)
GFR, Estimated: >60 mL/min (ref >60)
Glucose, Bld: 129 mg/dL — ABNORMAL HIGH (ref 70–99)
Potassium: 4 mmol/L (ref 3.5–5.1)
Sodium: 134 mmol/L — ABNORMAL LOW (ref 135–145)

## 2023-10-18 LAB — CBC
HCT: 26.1 % — ABNORMAL LOW (ref 36.0–46.0)
Hemoglobin: 8.4 g/dL — ABNORMAL LOW (ref 12.0–15.0)
MCH: 27.7 pg (ref 26.0–34.0)
MCHC: 32.2 g/dL (ref 30.0–36.0)
MCV: 86.1 fL (ref 80.0–100.0)
Platelets: 314 10*3/uL (ref 150–400)
RBC: 3.03 MIL/uL — ABNORMAL LOW (ref 3.87–5.11)
RDW: 21.7 % — ABNORMAL HIGH (ref 11.5–15.5)
WBC: 8 10*3/uL (ref 4.0–10.5)
nRBC: 0 % (ref 0.0–0.2)

## 2023-10-18 MED ORDER — ISOSORBIDE MONONITRATE ER 30 MG PO TB24
15.0000 mg | ORAL_TABLET | Freq: Every day | ORAL | Status: DC
  Administered 2023-10-18 – 2023-10-22 (×5): 15 mg via ORAL
  Filled 2023-10-18 (×5): qty 1

## 2023-10-18 MED ORDER — MORPHINE SULFATE (PF) 2 MG/ML IV SOLN
2.0000 mg | INTRAVENOUS | Status: DC | PRN
  Administered 2023-10-22: 2 mg via INTRAVENOUS
  Filled 2023-10-18: qty 1

## 2023-10-18 MED ORDER — LEVOTHYROXINE SODIUM 50 MCG PO TABS
50.0000 ug | ORAL_TABLET | Freq: Every day | ORAL | Status: DC
  Administered 2023-10-19 – 2023-10-22 (×4): 50 ug via ORAL
  Filled 2023-10-18 (×4): qty 1

## 2023-10-18 MED ORDER — FERROUS SULFATE 325 (65 FE) MG PO TABS
325.0000 mg | ORAL_TABLET | Freq: Every day | ORAL | Status: DC
  Administered 2023-10-19 – 2023-10-22 (×4): 325 mg via ORAL
  Filled 2023-10-18 (×4): qty 1

## 2023-10-18 MED ORDER — VITAMIN B-12 1000 MCG PO TABS
1000.0000 ug | ORAL_TABLET | Freq: Every day | ORAL | Status: DC
  Administered 2023-10-18 – 2023-10-22 (×5): 1000 ug via ORAL
  Filled 2023-10-18 (×5): qty 1

## 2023-10-18 MED ORDER — MEMANTINE HCL 10 MG PO TABS
10.0000 mg | ORAL_TABLET | Freq: Two times a day (BID) | ORAL | Status: DC
  Administered 2023-10-18 – 2023-10-22 (×9): 10 mg via ORAL
  Filled 2023-10-18 (×10): qty 1

## 2023-10-18 MED ORDER — PANTOPRAZOLE SODIUM 40 MG PO TBEC
40.0000 mg | DELAYED_RELEASE_TABLET | Freq: Two times a day (BID) | ORAL | Status: DC
  Administered 2023-10-18 – 2023-10-22 (×9): 40 mg via ORAL
  Filled 2023-10-18 (×9): qty 1

## 2023-10-18 MED ORDER — CARBAMAZEPINE ER 100 MG PO TB12
600.0000 mg | ORAL_TABLET | Freq: Two times a day (BID) | ORAL | Status: DC
  Administered 2023-10-18 – 2023-10-22 (×9): 600 mg via ORAL
  Filled 2023-10-18 (×2): qty 6
  Filled 2023-10-18 (×5): qty 3
  Filled 2023-10-18: qty 6
  Filled 2023-10-18 (×3): qty 3

## 2023-10-18 MED ORDER — ETHOSUXIMIDE 250 MG PO CAPS
500.0000 mg | ORAL_CAPSULE | Freq: Two times a day (BID) | ORAL | Status: DC
  Administered 2023-10-18 – 2023-10-22 (×9): 500 mg via ORAL
  Filled 2023-10-18 (×10): qty 2

## 2023-10-18 MED ORDER — METHOCARBAMOL 500 MG PO TABS
1000.0000 mg | ORAL_TABLET | Freq: Three times a day (TID) | ORAL | Status: AC
  Administered 2023-10-18 – 2023-10-20 (×6): 1000 mg via ORAL
  Filled 2023-10-18 (×6): qty 2

## 2023-10-18 MED ORDER — DULOXETINE HCL 60 MG PO CPEP
60.0000 mg | ORAL_CAPSULE | Freq: Two times a day (BID) | ORAL | Status: DC
  Administered 2023-10-18 – 2023-10-22 (×9): 60 mg via ORAL
  Filled 2023-10-18 (×9): qty 1

## 2023-10-18 MED ORDER — PRASUGREL HCL 10 MG PO TABS: 10.0000 mg | ORAL_TABLET | Freq: Every day | ORAL | Status: DC

## 2023-10-18 MED ORDER — ASPIRIN 81 MG PO TBEC
81.0000 mg | DELAYED_RELEASE_TABLET | Freq: Every day | ORAL | Status: DC
  Administered 2023-10-18 – 2023-10-22 (×5): 81 mg via ORAL
  Filled 2023-10-18 (×5): qty 1

## 2023-10-18 MED ORDER — CALCIUM CARBONATE ANTACID 500 MG PO CHEW
1.0000 | CHEWABLE_TABLET | Freq: Every day | ORAL | Status: DC
  Administered 2023-10-18 – 2023-10-22 (×5): 200 mg via ORAL
  Filled 2023-10-18 (×5): qty 1

## 2023-10-18 MED ORDER — HALOPERIDOL LACTATE 5 MG/ML IJ SOLN
2.5000 mg | Freq: Three times a day (TID) | INTRAMUSCULAR | Status: DC | PRN
  Administered 2023-10-18 – 2023-10-21 (×3): 2.5 mg via INTRAVENOUS
  Filled 2023-10-18 (×3): qty 1

## 2023-10-18 MED ORDER — FLUTICASONE PROPIONATE 50 MCG/ACT NA SUSP
1.0000 | Freq: Every day | NASAL | Status: DC
  Administered 2023-10-19 – 2023-10-22 (×4): 1 via NASAL
  Filled 2023-10-18: qty 16

## 2023-10-18 MED ORDER — AMLODIPINE BESYLATE 5 MG PO TABS
5.0000 mg | ORAL_TABLET | Freq: Every day | ORAL | Status: DC
  Administered 2023-10-18 – 2023-10-22 (×4): 5 mg via ORAL
  Filled 2023-10-18 (×5): qty 1

## 2023-10-18 MED ORDER — POLYETHYLENE GLYCOL 3350 17 G PO PACK
17.0000 g | PACK | Freq: Two times a day (BID) | ORAL | Status: DC
Start: 2023-10-18 — End: 2023-10-22
  Administered 2023-10-18 – 2023-10-22 (×5): 17 g via ORAL
  Filled 2023-10-18 (×6): qty 1

## 2023-10-18 MED ORDER — LIDOCAINE 5 % EX PTCH
2.0000 | MEDICATED_PATCH | CUTANEOUS | Status: DC
Start: 2023-10-18 — End: 2023-10-22
  Administered 2023-10-18 – 2023-10-22 (×5): 2 via TRANSDERMAL
  Filled 2023-10-18 (×5): qty 2

## 2023-10-18 MED ORDER — VITAMIN D 25 MCG (1000 UNIT) PO TABS
4000.0000 [IU] | ORAL_TABLET | Freq: Every day | ORAL | Status: DC
  Administered 2023-10-18 – 2023-10-22 (×5): 4000 [IU] via ORAL
  Filled 2023-10-18 (×5): qty 4

## 2023-10-18 NOTE — Plan of Care (Signed)

## 2023-10-18 NOTE — Evaluation (Signed)
 Physical Therapy Evaluation Patient Details Name: Rachel Clark MRN: [id number] DOB: 03/22/1951 Today's Date: 10/18/2023  History of Present Illness  73 yo presenting 10/17/23 with back and leg pain s/p fall. Admitted R sacral ALA and L sup/inf pubic rami fractures. WUJ:[id number], Epilepsy, heart block s/p PPM, fibromyalgia, colon CA, CABG.  Clinical Impression  Pt is a 73 y.o. female presenting on 10/17/23 with above conditions and deficits below, see PT Problem List. PTA pt lived with her husband in an one level home and received assistance for all iADLs. Currently she is mod-max Ax2 for bed mobility and mod A x2 for transfers with a RW. Pt displays deficits in global LE strength, activity tolerance, cognition, balance, transfers, functional mobility, and gait and would benefit from continued acute PT. The pt is motivated to improve with one of her biggest limiting factors being pain. As her pain decreases and she increases her mobility, the pt would benefit from intense rehab > 3 hours upon d/c to promote a return to functional independence. Will continue to follow acutely.    Pt's BP Supine: 145/83 EOB:127/60 EOB after 1 sit to stand: 119/64 Supine EOS: 124/58         If plan is discharge home, recommend the following: Two people to help with walking and/or transfers;A lot of help with bathing/dressing/bathroom;Assistance with cooking/housework;Direct supervision/assist for medications management;Direct supervision/assist for financial management;Assist for transportation;Help with stairs or ramp for entrance   Can travel by private vehicle   Yes    Equipment Recommendations Rolling walker (2 wheels);Wheelchair (measurements PT);Wheelchair cushion (measurements PT)  Recommendations for Other Services  Rehab consult    Functional Status Assessment Patient has had a recent decline in their functional status and/or demonstrates limited ability to make significant improvements in  function in a reasonable and predictable amount of time     Precautions / Restrictions Precautions Precautions: Fall;ICD/Pacemaker;Other (comment) (orthostatic) Recall of Precautions/Restrictions: Impaired Precaution/Restrictions Comments: watch BP; premedicate Restrictions Weight Bearing Restrictions Per Provider Order: No      Mobility  Bed Mobility Overal bed mobility: Needs Assistance Bed Mobility: Supine to Sit, Sit to Sidelying, Rolling Rolling: Max assist, +2 for physical assistance   Supine to sit: Mod assist, HOB elevated, Used rails   Sit to sidelying: Min assist, Max assist General bed mobility comments: supine to sit: required mod A for proper sequencing, hand placement, attention to task, and squaring hips at EOB. Sit to sidelying and rolling: pt required min A for proper sequencing to rest on L elbow before max A x2 with helicopter manuever to supine and max A for LE elevation.    Transfers Overall transfer level: Needs assistance Equipment used: Rolling walker (2 wheels) Transfers: Sit to/from Stand Sit to Stand: Mod assist, +2 physical assistance, From elevated surface           General transfer comment: pt completed 4 sit to stands, 1st from lowest bed height and 3 from an elevated height. For all transfers, pt reported increased hip and buttocks pain that limited her ability to fully extend her knees and trunk and she would sit back down. Pt was unable  to march in place s/t hip/buttocks pain    Ambulation/Gait               General Gait Details: deferred s/t pt presentation  Stairs            Wheelchair Mobility     Tilt Bed    Modified Rankin (Stroke Patients Only)  Balance Overall balance assessment: Needs assistance, History of Falls Sitting-balance support: No upper extremity supported, Feet supported, Bilateral upper extremity supported Sitting balance-Leahy Scale: Fair Sitting balance - Comments: pt sits EOB without  LOB   Standing balance support: Reliant on assistive device for balance, During functional activity, Bilateral upper extremity supported Standing balance-Leahy Scale: Poor Standing balance comment: pt reliant on RW and therapist assistance for standing balance. Stands with knees still flexed. No LOB                             Pertinent Vitals/Pain Pain Assessment Pain Assessment: Faces Faces Pain Scale: Hurts even more Pain Location: groin area/hip/back with mobility; worse LLE Pain Descriptors / Indicators: Discomfort, Grimacing, Aching, Guarding Pain Intervention(s): Monitored during session, Patient requesting pain meds-RN notified, RN gave pain meds during session    Home Living Family/patient expects to be discharged to:: Private residence Living Arrangements: Spouse/significant other;Children Available Help at Discharge: Family;Available 24 hours/day (husband has back problems and son just had brain surgery) Type of Home: House Home Access: Stairs to enter Entrance Stairs-Rails: Left Entrance Stairs-Number of Steps: 2   Home Layout: One level Home Equipment: Shower seat;BSC/3in1      Prior Function Prior Level of Function : Independent/Modified Independent;Needs assist             Mobility Comments: independent ADLs Comments: requires assistance for all IADL tasks     Extremity/Trunk Assessment   Upper Extremity Assessment Upper Extremity Assessment: Defer to OT evaluation    Lower Extremity Assessment Lower Extremity Assessment: RLE deficits/detail;LLE deficits/detail RLE Deficits / Details: able to fully extend knee against gravity but it causes hip and knee pain LLE Deficits / Details: able to extend knee against gravity    Cervical / Trunk Assessment Cervical / Trunk Assessment: Other exceptions (pelvic fx; posterior bias)  Communication   Communication Communication: No apparent difficulties    Cognition Arousal: Alert Behavior During  Therapy: WFL for tasks assessed/performed   PT - Cognitive impairments: History of cognitive impairments, Orientation   Orientation impairments: Place, Time, Situation                   PT - Cognition Comments: pt has history of dementia. Was oriented to person. Pt will intermittently gently pull/play at lines within her reach, i.e. pulse ox & heart monitor. Requires intermittent cueing to keep on task and avoid pulling lines Following commands: Intact       Cueing Cueing Techniques: Verbal cues, Tactile cues, Visual cues     General Comments General comments (skin integrity, edema, etc.): See PT comments for BP    Exercises     Assessment/Plan    PT Assessment Patient needs continued PT services  PT Problem List Decreased strength;Decreased range of motion;Decreased activity tolerance;Decreased balance;Decreased mobility;Decreased coordination;Decreased cognition;Decreased knowledge of use of DME;Decreased safety awareness;Decreased knowledge of precautions;Pain       PT Treatment Interventions DME instruction;Gait training;Stair training;Functional mobility training;Therapeutic activities;Therapeutic exercise;Balance training;Neuromuscular re-education;Cognitive remediation;Patient/family education;Wheelchair mobility training;Manual techniques;Modalities    PT Goals (Current goals can be found in the Care Plan section)  Acute Rehab PT Goals Patient Stated Goal: improve mobility PT Goal Formulation: With patient/family Time For Goal Achievement: 11/01/23 Potential to Achieve Goals: Good    Frequency Min 2X/week     Co-evaluation               AM-PAC PT "6 Clicks" Mobility  Outcome Measure Help  needed turning from your back to your side while in a flat bed without using bedrails?: Total Help needed moving from lying on your back to sitting on the side of a flat bed without using bedrails?: A Lot Help needed moving to and from a bed to a chair (including a  wheelchair)?: Total Help needed standing up from a chair using your arms (e.g., wheelchair or bedside chair)?: Total Help needed to walk in hospital room?: Total Help needed climbing 3-5 steps with a railing? : Total 6 Click Score: 7    End of Session Equipment Utilized During Treatment: Gait belt Activity Tolerance: Patient limited by pain Patient left: in bed;with call bell/phone within reach;with bed alarm set;with family/visitor present Nurse Communication: Mobility status;Other (comment) (pt request for pain medication) PT Visit Diagnosis: Unsteadiness on feet (R26.81);Other abnormalities of gait and mobility (R26.89);Muscle weakness (generalized) (M62.81);History of falling (Z91.81);Difficulty in walking, not elsewhere classified (R26.2);Other symptoms and signs involving the nervous system (R29.898);Pain Pain - Right/Left: Right (bil) Pain - part of body: Leg (pelvis)    Time: 1610-9604 PT Time Calculation (min) (ACUTE ONLY): 29 min   Charges:   PT Evaluation $PT Eval Moderate Complexity: 1 Mod PT Treatments $Therapeutic Activity: 8-22 mins PT General Charges $$ ACUTE PT VISIT: 1 Visit         [address], SPT   [place] 10/18/2023, 5:52 PM

## 2023-10-18 NOTE — Evaluation (Signed)
 Occupational Therapy Evaluation Patient Details Name: Rachel Clark MRN: [id number] DOB: 02-Apr-1951 Today's Date: 10/18/2023   History of Present Illness   73 yo s/p fall with resulting R sacral ALA fx, L sup/inf pubic rami fx . WBAT per Dr Guyann Leitz. WUJ:WJXBJYNW, Epilepsy, heart block s/p PPM, fibromyalgia, colon CA, CABG.     Clinical Impressions PTA, pt lives independently with her husband, who assists with IADL tasks due to her dementia. Pt's son has recently moved in to assist, however he has had recent brain surgery. Pt currently requires Max A with bed mobility primarily due to limitations from pain. Pt orthostatic: BP supine 109/52; sitting 79/64; return to supine 109/60. Unable to stand at this time due to BP, however nsg states pt was able to ambulate to the bathroom with +2 assist and unfortunately had a near syncopal event requiring use of the Stedy to return to bed. Given pt's high level of functioning PTA, feel pt is an excellent candidate for intensive inpatient follow-up therapy, >3 hours/day with goal to maximize functional level of independence and return to familiar home with min A of family. Acute OT to follow.   .      If plan is discharge home, recommend the following:   Two people to help with walking and/or transfers;A lot of help with bathing/dressing/bathroom;Assistance with cooking/housework;Assist for transportation;Help with stairs or ramp for entrance     Functional Status Assessment   Patient has had a recent decline in their functional status and demonstrates the ability to make significant improvements in function in a reasonable and predictable amount of time.     Equipment Recommendations   Other (comment) (RW)     Recommendations for Other Services   Rehab consult     Precautions/Restrictions   Precautions Precautions: Fall;ICD/Pacemaker;Other (comment) (orthostatic) Recall of Precautions/Restrictions: Impaired Restrictions Weight  Bearing Restrictions Per Provider Order: No     Mobility Bed Mobility Overal bed mobility: Needs Assistance Bed Mobility: Supine to Sit, Sit to Supine     Supine to sit: Max assist Sit to supine: Max assist   General bed mobility comments: required use of bed pad to help move EOB; Assistance with BLE and trunk control when moving in/out of bed    Transfers                   General transfer comment: unable ot attmept due to orthostatic      Balance Overall balance assessment: History of Falls, Needs assistance   Sitting balance-Leahy Scale: Fair                                     ADL either performed or assessed with clinical judgement   ADL Overall ADL's : Needs assistance/impaired Eating/Feeding: Modified independent   Grooming: Set up;Sitting   Upper Body Bathing: Set up;Sitting   Lower Body Bathing: Maximal assistance;Bed level   Upper Body Dressing : Minimal assistance;Sitting   Lower Body Dressing: Maximal assistance   Toilet Transfer: +2 for safety/equipment;Minimal assistance Toilet Transfer Details (indicate cue type and reason): ambulated with nsg staff prior to session; near syncope event in bathroom         Functional mobility during ADLs: Maximal assistance       Vision Baseline Vision/History: 1 Wears glasses       Perception         Praxis  Pertinent Vitals/Pain Pain Assessment Pain Assessment: Faces Faces Pain Scale: Hurts whole lot Pain Location: groin area/hip/back with mobility; worse LLE Pain Descriptors / Indicators: Discomfort, Grimacing, Aching, Guarding Pain Intervention(s): Limited activity within patient's tolerance, Premedicated before session     Extremity/Trunk Assessment Upper Extremity Assessment Upper Extremity Assessment: Generalized weakness   Lower Extremity Assessment Lower Extremity Assessment: Defer to PT evaluation   Cervical / Trunk Assessment Cervical / Trunk  Assessment: Other exceptions (pelvic fx; posterior bias)   Communication Communication Communication: No apparent difficulties   Cognition Arousal: Alert Behavior During Therapy: WFL for tasks assessed/performed Cognition: History of cognitive impairments             OT - Cognition Comments: per family repeats same quesitons; pt not aawre of boken pelvis                 Following commands: Intact       Cueing  General Comments   Cueing Techniques: Verbal cues;Tactile cues;Visual cues  multiple bruises   Exercises Exercises: Other exercises Other Exercises Other Exercises: encouraged use of incentive spirometer   Shoulder Instructions      Home Living Family/patient expects to be discharged to:: Private residence Living Arrangements: Spouse/significant other;Children Available Help at Discharge: Family;Available 24 hours/day (husband has back problems and son just had brain surgery) Type of Home: House Home Access: Stairs to enter [employer] of Steps: 2 Entrance Stairs-Rails: Left Home Layout: One level     Bathroom Shower/Tub: Producer, television/film/video: Handicapped height Bathroom Accessibility: Yes How Accessible: Accessible via wheelchair Home Equipment: Shower seat;BSC/3in1          Prior Functioning/Environment Prior Level of Function : Independent/Modified Independent;Needs assist               ADLs Comments: requires assistance for all IADL tasks    OT Problem List: Decreased strength;Decreased range of motion;Decreased activity tolerance;Impaired balance (sitting and/or standing);Decreased cognition;Decreased safety awareness;Decreased knowledge of use of DME or AE;Cardiopulmonary status limiting activity;Pain   OT Treatment/Interventions: Self-care/ADL training;Therapeutic exercise;DME and/or AE instruction;Therapeutic activities;Cognitive remediation/compensation;Patient/family education;Balance training       OT Goals(Current goals can be found in the care plan section)   Acute Rehab OT Goals Patient Stated Goal: to get better and return home OT Goal Formulation: With patient/family Time For Goal Achievement: 11/01/23 Potential to Achieve Goals: Good   OT Frequency:  Min 2X/week    Co-evaluation              AM-PAC OT "6 Clicks" Daily Activity     Outcome Measure Help from another person eating meals?: None Help from another person taking care of personal grooming?: A Little Help from another person toileting, which includes using toliet, bedpan, or urinal?: Total Help from another person bathing (including washing, rinsing, drying)?: A Lot Help from another person to put on and taking off regular upper body clothing?: A Little Help from another person to put on and taking off regular lower body clothing?: A Lot 6 Click Score: 15   End of Session Nurse Communication: Mobility status (orthostatic)  Activity Tolerance: Treatment limited secondary to medical complications (Comment) (orthostatic) Patient left: in bed;with call bell/phone within reach;with bed alarm set;with family/visitor present;with SCD's reapplied  OT Visit Diagnosis: Unsteadiness on feet (R26.81);Other abnormalities of gait and mobility (R26.89);Muscle weakness (generalized) (M62.81);History of falling (Z91.81);Other symptoms and signs involving cognitive function;Pain Pain - Right/Left: Left Pain - part of body: Leg (pelvis)  Time: 1200-1230 OT Time Calculation (min): 30 min Charges:  OT General Charges $OT Visit: 1 Visit OT Evaluation $OT Eval Moderate Complexity: 1 Mod OT Treatments $Self Care/Home Management : 8-22 mins  Milburn Aliment, OT/L   Acute OT Clinical Specialist Acute Rehabilitation Services Pager [phone] Office [phone]   [facility] 10/18/2023, 2:45 PM

## 2023-10-18 NOTE — Progress Notes (Signed)
 Inpatient Rehab Admissions Coordinator:   Per OT recommendations pt was screened for CIR by Loye Rumble, PT, DPT.  Also discussed in interdisciplinary trauma rounds.  Pt may be a candidate for CIR pending caregiver support (her primary caregiver is also caring for their son who just had brain surgery).  I will place an order for full assessment per our protocol.   Loye Rumble, PT, DPT Admissions Coordinator [phone] 10/18/23  4:12 PM

## 2023-10-18 NOTE — Progress Notes (Signed)
 Progress Note     Subjective: Pt confused this AM but reports some pain in hips. Keeps reporting that she needs to use the restroom. Vomited some while I was in the room and per her husband she had been doing this some intermittently at home. Updated her husband at bedside and daughter by phone.   Objective: Vital signs in last 24 hours: Temp:  [97.7 F (36.5 C)-98.9 F (37.2 C)] 97.7 F (36.5 C) (04/17 1155) Pulse Rate:  [80-98] 98 (04/17 1155) Resp:  [16-20] 18 (04/17 1155) BP: (115-151)/(61-97) 115/61 (04/17 1155) SpO2:  [94 %-100 %] 96 % (04/17 1155)    Intake/Output from previous day: 04/16 0701 - 04/17 0700 In: 240 [P.O.:240] Out: 200 [Urine:200] Intake/Output this shift: No intake/output data recorded.  PE: General: pleasant, WD, overweight female who is laying in bed in NAD HEENT: head is normocephalic, atraumatic.  Sclera are noninjected.  Pupils equal. Ears and nose without any masses or lesions.  Mouth is pink and moist Heart: regular, rate, and rhythm.  Palpable radial and pedal pulses bilaterally Lungs:  Respiratory effort nonlabored Abd: soft, NT, ND MS: hematoma to BL hips    Lab Results:  Recent Labs    10/17/23 1950 10/18/23 0547  WBC 8.5 8.0  HGB 8.7* 8.4*  HCT 27.3* 26.1*  PLT 332 314   BMET Recent Labs    10/17/23 0930 10/17/23 0943 10/18/23 0547  NA 137 136 134*  K 4.3 4.2 4.0  CL 104 103 104  CO2 24  --  22  GLUCOSE 119* 114* 129*  BUN 15 16 11   CREATININE 0.80 0.80 0.66  CALCIUM  8.3*  --  8.4*   PT/INR Recent Labs    10/17/23 0930  LABPROT 14.9  INR 1.2   CMP     Component Value Date/Time   NA 134 (L) 10/18/2023 0547   K 4.0 10/18/2023 0547   CL 104 10/18/2023 0547   CO2 22 10/18/2023 0547   GLUCOSE 129 (H) 10/18/2023 0547   BUN 11 10/18/2023 0547   CREATININE 0.66 10/18/2023 0547   CALCIUM  8.4 (L) 10/18/2023 0547   PROT 5.8 (L) 10/17/2023 0930   ALBUMIN 3.0 (L) 10/17/2023 0930   AST 19 10/17/2023 0930   ALT 12  10/17/2023 0930   ALKPHOS 47 10/17/2023 0930   BILITOT 0.4 10/17/2023 0930   GFRNONAA >60 10/18/2023 0547   Lipase  No results found for: "LIPASE"     Studies/Results: DG Chest Port 1 View Result Date: 10/17/2023 CLINICAL DATA:  Found down. EXAM: PORTABLE CHEST 1 VIEW COMPARISON:  Chest radiograph dated 02/03/2023. CT chest, abdomen, and pelvis dated 10/17/2023. FINDINGS: The heart size and mediastinal contours are within normal limits. 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 Mild left basilar scarring/atelectasis. No focal consolidation, pleural effusion, or pneumothorax. No acute osseous abnormality identified. IMPRESSION: Mild left basilar scarring/atelectasis. Otherwise, no acute findings in the chest. Electronically Signed   By: Mannie Seek M.D.   On: 10/17/2023 10:13   DG Pelvis Portable Result Date: 10/17/2023 CLINICAL DATA:  Found down. EXAM: PORTABLE PELVIS 1-2 VIEWS COMPARISON:  CT chest, abdomen, and pelvis dated 10/17/2023 at 9:22 a.m. FINDINGS: Comminuted minimally displaced fracture of the left pubic body with extension along the left superior pubic ramus is again noted. Irregularity of the right sacral ala corresponds to nondisplaced fracture of the right sacral ala, better visualized on the same-day CT. Femoral heads are seated within the  acetabulum. No dislocation. Mild degenerative changes of the bilateral hips. Degenerative changes of the visualized lower lumbar spine. Vascular calcifications are present. IMPRESSION: 1. Comminuted minimally displaced fracture of the left pubic body with extension along the left superior pubic ramus. 2. Nondisplaced fracture of the right sacral ala is better visualized on the same-day CT. Electronically Signed   By: Mannie Seek M.D.   On: 10/17/2023 10:10   CT HEAD WO CONTRAST ( ) Result Date: 10/17/2023 CLINICAL DATA:  Provided history: Polytrauma, blunt. Additional history provided:  Fall. Hypotensive. EXAM: CT HEAD WITHOUT CONTRAST CT CERVICAL SPINE WITHOUT CONTRAST TECHNIQUE: Multidetector CT imaging of the head and cervical spine was performed following the standard protocol without intravenous contrast. Multiplanar CT image reconstructions of the cervical spine were also generated. RADIATION DOSE REDUCTION: This exam was performed according to the departmental dose-optimization program which includes automated exposure control, adjustment of the mA and/or kV according to patient size and/or use of iterative reconstruction technique. COMPARISON:  CT head 08/31/2023. Thyroid  ultrasound 11/12/2012. Thyroid  ultrasound 05/05/2013. FINDINGS: CT HEAD FINDINGS Brain: Generalized cerebral atrophy. Focus of chronic encephalomalacia/gliosis again demonstrated within the anterior left temporal lobe. Chronic lacunar infarct again demonstrated within the left subinsular white matter. Mild-to-moderate patchy and ill-defined hypoattenuation elsewhere within the cerebral white matter, nonspecific but compatible chronic small vessel disease. There is no acute intracranial hemorrhage. No acute demarcated cortical infarct. No extra-axial fluid collection. No evidence of an intracranial mass. No midline shift. Vascular: No hyperdense vessel.  Atherosclerotic calcifications. Skull: No calvarial fracture or aggressive osseous lesion. Sinuses/Orbits: No mass or acute finding within the imaged orbits. No significant paranasal sinus disease at the imaged levels. CT CERVICAL SPINE FINDINGS Alignment: Nonspecific reversal of the expected cervical lordosis. 2 mm grade 1 anterolisthesis at C2-C3, C3-C4 and C4-C5. Slight grade 1 retrolisthesis at C7-T1 and T1-T2. Skull base and vertebrae: The basion-dental and atlanto-dental intervals are maintained.No evidence of acute fracture to the cervical spine. Soft tissues and spinal canal: No prevertebral fluid or swelling. No visible canal hematoma. Disc levels: Cervical  spondylosis with multilevel disc space narrowing, disc bulges/central disc protrusions, posterior disc osteophyte complexes, uncovertebral hypertrophy and facet arthropathy. Disc space narrowing is advanced at C5-C6 and C6-C7 (with possible bridging osseous fusion across the disc spaces at these levels). Multilevel spinal canal narrowing. Most notably at C5-C6, a posterior disc osteophyte complex results in mild-to-moderate spinal canal narrowing. Multilevel bony neural foraminal narrowing. Multilevel ventral osteophytes. Degenerative changes also present at the C1-C2 articulation. Upper chest: Separately reported on same day CT chest/abdomen/pelvis. Other: Known thyroid  nodules, better delineated on the prior thyroid  ultrasound of 05/05/2013. By report, a dominant right thyroid  lobe nodule was previously biopsied. Correlate with pathology from this prior procedure. No evidence of an acute intracranial abnormality. No evidence of an acute cervical spine fracture. These results discussed in person at the time of interpretation on 10/17/2023 at 9:40 am with provider Dr. Aniceto Barley, who verbally acknowledged these results. IMPRESSION: CT head: 1. No evidence of an acute intracranial abnormality. 2. Focus of chronic encephalomalacia/gliosis within the anterior left temporal lobe. 3. 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 4. Background parenchymal atrophy and chronic small vessel disease. CT cervical spine: 1. No evidence of an acute cervical spine fracture. 2. Nonspecific reversal of the expected cervical lordosis. 3. Mild grade 1 anterolisthesis at C2-C3, C3-C4, C4-C5, C7-T1 and T1-T2. 4. Cervical spondylosis as described within the body of the report. 5. Possible vertebral body ankylosis at C5-C6 and C6-C7. Electronically  Signed   By: Bascom Lily D.O.   On: 10/17/2023 09:46   CT Cervical Spine Wo Contrast Result Date: 10/17/2023 CLINICAL DATA:  Provided history: Polytrauma, blunt. Additional  history provided: Fall. Hypotensive. EXAM: CT HEAD WITHOUT CONTRAST CT CERVICAL SPINE WITHOUT CONTRAST TECHNIQUE: Multidetector CT imaging of the head and cervical spine was performed following the standard protocol without intravenous contrast. Multiplanar CT image reconstructions of the cervical spine were also generated. RADIATION DOSE REDUCTION: This exam was performed according to the departmental dose-optimization program which includes automated exposure control, adjustment of the mA and/or kV according to patient size and/or use of iterative reconstruction technique. COMPARISON:  CT head 08/31/2023. Thyroid  ultrasound 11/12/2012. Thyroid  ultrasound 05/05/2013. FINDINGS: CT HEAD FINDINGS Brain: Generalized cerebral atrophy. Focus of chronic encephalomalacia/gliosis again demonstrated within the anterior left temporal lobe. Chronic lacunar infarct again demonstrated within the left subinsular white matter. Mild-to-moderate patchy and ill-defined hypoattenuation elsewhere within the cerebral white matter, nonspecific but compatible chronic small vessel disease. There is no acute intracranial hemorrhage. No acute demarcated cortical infarct. No extra-axial fluid collection. No evidence of an intracranial mass. No midline shift. Vascular: No hyperdense vessel.  Atherosclerotic calcifications. Skull: No calvarial fracture or aggressive osseous lesion. Sinuses/Orbits: No mass or acute finding within the imaged orbits. No significant paranasal sinus disease at the imaged levels. CT CERVICAL SPINE FINDINGS Alignment: Nonspecific reversal of the expected cervical lordosis. 2 mm grade 1 anterolisthesis at C2-C3, C3-C4 and C4-C5. Slight grade 1 retrolisthesis at C7-T1 and T1-T2. Skull base and vertebrae: The basion-dental and atlanto-dental intervals are maintained.No evidence of acute fracture to the cervical spine. Soft tissues and spinal canal: No prevertebral fluid or swelling. No visible canal hematoma. Disc  levels: Cervical spondylosis with multilevel disc space narrowing, disc bulges/central disc protrusions, posterior disc osteophyte complexes, uncovertebral hypertrophy and facet arthropathy. Disc space narrowing is advanced at C5-C6 and C6-C7 (with possible bridging osseous fusion across the disc spaces at these levels). Multilevel spinal canal narrowing. Most notably at C5-C6, a posterior disc osteophyte complex results in mild-to-moderate spinal canal narrowing. Multilevel bony neural foraminal narrowing. Multilevel ventral osteophytes. Degenerative changes also present at the C1-C2 articulation. Upper chest: Separately reported on same day CT chest/abdomen/pelvis. Other: Known thyroid  nodules, better delineated on the prior thyroid  ultrasound of 05/05/2013. By report, a dominant right thyroid  lobe nodule was previously biopsied. Correlate with pathology from this prior procedure. No evidence of an acute intracranial abnormality. No evidence of an acute cervical spine fracture. These results discussed in person at the time of interpretation on 10/17/2023 at 9:40 am with provider Dr. Aniceto Barley, who verbally acknowledged these results. IMPRESSION: CT head: 1. No evidence of an acute intracranial abnormality. 2. Focus of chronic encephalomalacia/gliosis within the anterior left temporal lobe. 3. 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 4. Background parenchymal atrophy and chronic small vessel disease. CT cervical spine: 1. No evidence of an acute cervical spine fracture. 2. Nonspecific reversal of the expected cervical lordosis. 3. Mild grade 1 anterolisthesis at C2-C3, C3-C4, C4-C5, C7-T1 and T1-T2. 4. Cervical spondylosis as described within the body of the report. 5. Possible vertebral body ankylosis at C5-C6 and C6-C7. Electronically Signed   By: Bascom Lily D.O.   On: 10/17/2023 09:46   CT CHEST ABDOMEN PELVIS W CONTRAST Result Date: 10/17/2023 CLINICAL DATA:  Found down, hypotension,  confusion EXAM: CT CHEST, ABDOMEN, AND PELVIS WITH CONTRAST TECHNIQUE: Multidetector CT imaging of the chest, abdomen and pelvis was performed following the standard protocol during  bolus administration of intravenous contrast. RADIATION DOSE REDUCTION: This exam was performed according to the departmental dose-optimization program which includes automated exposure control, adjustment of the mA and/or kV according to patient size and/or use of iterative reconstruction technique. CONTRAST:  80mL OMNIPAQUE  IOHEXOL  350 MG/ML SOLN COMPARISON:  11/03/2014 FINDINGS: CT CHEST FINDINGS Cardiovascular: The heart is unremarkable without pericardial effusion. Dual lead cardiac pacer is identified. No evidence of thoracic aortic aneurysm or dissection. Postsurgical changes from prior CABG. Atherosclerosis of the aorta and native coronary vessels. No evidence of mediastinal injury. Mediastinum/Nodes: No enlarged mediastinal, hilar, or axillary lymph nodes. Thyroid  gland, trachea, and esophagus demonstrate no significant findings. Lungs/Pleura: Scattered areas of parenchymal lung scarring are seen at the lung apices and left lung base. No acute airspace disease, effusion, or pneumothorax. Central airways are patent. Musculoskeletal: There are no acute displaced fractures. Reconstructed images demonstrate no additional findings. CT ABDOMEN PELVIS FINDINGS Hepatobiliary: No hepatic injury or perihepatic hematoma. Gallbladder is unremarkable. Pancreas: Unremarkable. No pancreatic ductal dilatation or surrounding inflammatory changes. Spleen: No splenic injury or perisplenic hematoma. Adrenals/Urinary Tract: No evidence of acute renal injury. Kidneys enhance normally and symmetrically. No urinary tract calculi or obstructive uropathy. Right adrenal is unremarkable. Thickening of the body of the left adrenal gland measuring 1 cm, with an attenuation of 40 HU, nonspecific but not significantly changed since prior study and likely  reflecting adenoma. The bladder is moderately distended, without focal abnormality. No filling defects. Stomach/Bowel: No bowel obstruction or ileus. Moderate retained stool throughout the colon, most pronounced in the mid transverse colon at the site of prior partial colon resection and reanastomosis. No bowel wall thickening or inflammatory change. Vascular/Lymphatic: Aortic atherosclerosis. No enlarged abdominal or pelvic lymph nodes. Reproductive: Status post hysterectomy. No adnexal masses. Other: No free intraperitoneal fluid or free intraperitoneal gas. No abdominal wall hernia. Musculoskeletal: There is a comminuted fracture through the left side pubic symphysis, extending to the left superior pubic ramus. Mild displacement of the fracture fragments, with near anatomic alignment. Soft tissue edema is seen along the left pelvic sidewall adjacent to the fracture site. No contrast extravasation to suggest active hemorrhage. There is also a nondisplaced fracture through the right sacral ala, with mild adjacent soft tissue edema. No contrast extravasation to suggest active hemorrhage. No other acute bony abnormalities. Reconstructed images demonstrate no additional findings. IMPRESSION: 1. Comminuted and minimally displaced left parasymphyseal fracture extending into the left superior pubic ramus, with soft tissue edema along the left pelvic sidewall. No contrast extravasation to suggest active hemorrhage. No evidence of acute bladder injury. 2. Nondisplaced fracture through the right sacral ala, with minimal adjacent soft tissue edema. No contrast extravasation to suggest active hemorrhage. 3. Otherwise no acute intrathoracic or intra-abdominal trauma. 4.  Aortic Atherosclerosis (ICD10-I70.0). Critical Value/emergent results were called by telephone at the time of interpretation on 10/17/2023 at 9:34 am to provider DR Aniceto Barley, who verbally acknowledged these results. Electronically Signed   By: Bobbye Burrow M.D.    On: 10/17/2023 09:40    Anti-infectives: Anti-infectives (From admission, onward)    None        Assessment/Plan  GLF L sacral ala and L superior pubic rami fx - WBAT per Dr. Guyann Leitz   Hypotension - improved N/V/Constipation - bowel regimen, zofran  prn, monitor  Hypothyroidism Epilepsy Dementia  Hx of complete heart block s/p PPM Hx of gastric ulcer Fibromyalgia  Hx of colon cancer s/p resection in 2013 CAD s/p CABG and stents - holding anticoagulation, maybe able to  resume tomorrow if hgb stable. Hgb 8.4 today from 8.9 on admission   FEN: reg diet, SLIV, bowel regimen VTE: SCDs, possibly resume effient  4/18 if hgb remains stable ID: no current abx  Dispo: 4NP, pain control and therapies. Resumed majority of home meds today.    LOS: 1 day   I reviewed [facility] notes, last 24 h vitals and pain scores, last 48 h intake and output, last 24 h labs and trends, and last 24 h imaging results.  This care required moderate level of medical decision making.    Annetta Killian, [facility] 10/18/2023, 1:04 PM Please see Amion for pager number during day hours 7:00am-4:30pm

## 2023-10-18 NOTE — Progress Notes (Signed)
 Patient is alert and oriented x2 (place and self). Had tele sitter due to her pulling off medical devices from previous shift's order. Patient removed L-IV and pulls off tele constantly. Wrapped IV that remains and she began pulling it off as soon as I finished. Placed mittens and she pulled them off as well. She is displaying signs of delirium. Not redirectable and is attempting to remove devices consistently which interferes with her safety and the nurse's ability to care for other patients. Called husband whom stated that he noticed her getting more restless before he left but that he cannot come back tonight. I explained to husband the things we have tried and how it is escalated if one solution does not work for safety reasons and husband is ok if patient has to have restraints. Paged Dr, [name] Punches who stated that she wanted to try haldol  first. Will administer and update MD accordingly.

## 2023-10-19 NOTE — Care Management Important Message (Signed)
 Important Message  Patient Details  Name: Rachel Clark MRN: [id number] Date of Birth: 08-21-1950   Important Message Given:  Yes - Medicare IM     Felix Host 10/19/2023, 4:36 PM

## 2023-10-19 NOTE — PMR Pre-admission (Signed)
 PMR Admission Coordinator Pre-Admission Assessment  Patient: Rachel Clark is an 73 y.o., female MRN: [id number] DOB: 1950-12-13 Height: 5\' 8"  (172.7 cm) Weight: 80.1 kg  Insurance Information HMO:     PPO:      PCP:      IPA:      80/20:      OTHER:  PRIMARY: Medicare Part A and B      Policy#: [id number]      Subscriber: pt Benefits:  Phone #: passport one source online     Name: 4/18 Eff. Date: 05/03/2014     Deduct: $1676      Out of Pocket Max: none      Life Max: none CIR: 100%      SNF: 20 full days Outpatient: 80%     Co-Pay: 20% Home Health: 100%      Co-Pay: none DME: 80%     Co-Pay: 20% Providers: pt choice  SECONDARY: [employer]      Policy#: [id number]     Phone#:   Financial Counselor:       Phone#:   The "Data Collection Information Summary" for patients in Inpatient Rehabilitation Facilities with attached "Privacy Act Statement-Health Care Records" was provided and verbally reviewed with: Patient and Family  Emergency Contact Information Contact Information     Name Relation Home Work Mobile   Pellow,James Spouse [phone]        Other Contacts   None on File     Current Medical History  Patient Admitting Diagnosis: right sacral ala and left pubic rami fractures  History of Present Illness: LATICA HOHMANN is a 73 year old right-handed female with history of dementia maintained on Namenda , hypothyroidism, complete heart block status post permanent pacemaker as well as history of CABG with stents followed by cardiology service Dr. [place] maintained on low-dose aspirin , hypertension, history of GI bleed/gastric ulcer, epilepsy maintained on Tegretol , chronic anemia followed by hematology services Dr. Marton Sleeper as well as colon cancer.  Per chart review lives with husband and son.  Independent mobility but needed assistance due to dementia and safety awareness.  She was rather sedentary prior to admission.  Presented 10/17/2023 after fall.  No report of  loss of consciousness.  Admission chemistries unremarkable except glucose 119, hemoglobin 8.9, WBC 13,500.  Cranial CT scan negative for acute changes.  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  CT cervical spine negative.  X-rays and CT imaging of the pelvis showed comminuted minimally displaced fracture of the left pubic body with extension along the left superior pubic ramus.  Nondisplaced fracture of the right sacral ala.  Follow-up orthopedic service Dr. Kevan Peers nonoperative weightbearing as tolerated.  Hospital course in regards to patient's acute on chronic anemia she was receiving iron infusions as an outpatient which was administered 10/22/2023 while in the hospital.  Hemoglobin ranges from 7.5-8.7.  She was cleared to continue her low-dose aspirin  therapy.  Lovenox  added for DVT prophylaxis 10/19/2023.  He did have some hyponatremia 130-134 and was placed on sodium chloride  tablets 1 g 3 times daily 10/21/2023.  Patient on Effient  as well as Repatha prior to admission remains on hold.  Therapy evaluations completed due to patient's decreased functional mobility was admitted for a comprehensive rehab program.   Patient's medical record from [facility] has been reviewed by the rehabilitation admission coordinator and physician.  Past Medical History  No past medical history on file.  Has the patient had major surgery during 100  days prior to admission? No  Family History   family history is not on file.  Current Medications  Current Facility-Administered Medications:    acetaminophen  (TYLENOL ) tablet 1,000 mg, 1,000 mg, Oral, Q6H, Lovick, Bard Boor, MD, 1,000 mg at 10/19/23 1130   amLODipine  (NORVASC ) tablet 5 mg, 5 mg, Oral, Daily, Johnson, Kelly R, PA-C, 5 mg at 10/18/23 1142   aspirin  EC tablet 81 mg, 81 mg, Oral, Daily, Annetta Killian, PA-C, 81 mg at 10/19/23 0900   calcium  carbonate (TUMS - dosed in mg elemental calcium ) chewable tablet 200 mg of elemental  calcium , 1 tablet, Oral, Daily, Annetta Killian, PA-C, 200 mg of elemental calcium  at 10/19/23 0900   carbamazepine  (TEGRETOL  XR) 12 hr tablet 600 mg, 600 mg, Oral, BID, Johnson, Kelly R, PA-C, 600 mg at 10/19/23 0900   cholecalciferol  (VITAMIN D3) 25 MCG (1000 UNIT) tablet 4,000 Units, 4,000 Units, Oral, Daily, Annetta Killian, PA-C, 4,000 Units at 10/19/23 0900   cyanocobalamin  (VITAMIN B12) tablet 1,000 mcg, 1,000 mcg, Oral, Daily, Johnson, Kelly R, PA-C, 1,000 mcg at 10/19/23 0900   docusate sodium  (COLACE) capsule 100 mg, 100 mg, Oral, BID, Lovick, Bard Boor, MD, 100 mg at 10/19/23 0900   DULoxetine  (CYMBALTA ) DR capsule 60 mg, 60 mg, Oral, BID, Johnson, Kelly R, PA-C, 60 mg at 10/19/23 0900   enoxaparin  (LOVENOX ) injection 30 mg, 30 mg, Subcutaneous, Q12H, Lovick, Bard Boor, MD, 30 mg at 10/19/23 0900   ethosuximide  (ZARONTIN ) capsule 500 mg, 500 mg, Oral, BID, Johnson, Kelly R, PA-C, 500 mg at 10/19/23 0900   ferrous sulfate  tablet 325 mg, 325 mg, Oral, Q breakfast, Johnson, Kelly R, PA-C, 325 mg at 10/19/23 0825   fluticasone  (FLONASE ) 50 MCG/ACT nasal spray 1 spray, 1 spray, Each Nare, Daily, Annetta Killian, PA-C, 1 spray at 10/19/23 0900   haloperidol  lactate (HALDOL ) injection 2.5 mg, 2.5 mg, Intravenous, Q8H PRN, Lujean Sake, MD, 2.5 mg at 10/18/23 2204   hydrALAZINE  (APRESOLINE ) injection 10 mg, 10 mg, Intravenous, Q2H PRN, Anda Bamberg, MD   isosorbide  mononitrate (IMDUR ) 24 hr tablet 15 mg, 15 mg, Oral, Daily, Johnson, Kelly R, PA-C, 15 mg at 10/19/23 0900   levothyroxine  (SYNTHROID ) tablet 50 mcg, 50 mcg, Oral, Daily, Johnson, Kelly R, PA-C, 50 mcg at 10/19/23 0558   lidocaine  (LIDODERM ) 5 % 2 patch, 2 patch, Transdermal, Q24H, Annetta Killian, PA-C, 2 patch at 10/19/23 1130   memantine  (NAMENDA ) tablet 10 mg, 10 mg, Oral, BID, Johnson, Kelly R, PA-C, 10 mg at 10/19/23 0900   methocarbamol  (ROBAXIN ) tablet 1,000 mg, 1,000 mg, Oral, Q8H, 1,000 mg at 10/19/23 1130 **OR**  [DISCONTINUED] methocarbamol  (ROBAXIN ) injection 500 mg, 500 mg, Intravenous, Q8H, Lovick, Bard Boor, MD   metoprolol  tartrate (LOPRESSOR ) injection 5 mg, 5 mg, Intravenous, Q6H PRN, Lovick, Bard Boor, MD   morphine  (PF) 2 MG/ML injection 2 mg, 2 mg, Intravenous, Q4H PRN, Annetta Killian, PA-C   ondansetron  (ZOFRAN -ODT) disintegrating tablet 4 mg, 4 mg, Oral, Q6H PRN **OR** ondansetron  (ZOFRAN ) injection 4 mg, 4 mg, Intravenous, Q6H PRN, Lovick, Bard Boor, MD   oxyCODONE  (Oxy IR/ROXICODONE ) immediate release tablet 2.5-5 mg, 2.5-5 mg, Oral, Q4H PRN, Anda Bamberg, MD, 5 mg at 10/18/23 2034   pantoprazole  (PROTONIX ) EC tablet 40 mg, 40 mg, Oral, BID, Johnson, Kelly R, PA-C, 40 mg at 10/19/23 0900   polyethylene glycol (MIRALAX  / GLYCOLAX ) packet 17 g, 17 g, Oral, BID, Johnson, Kelly R, PA-C, 17 g at 10/19/23 0900  Patients Current Diet:  Diet Order             Diet regular Room service appropriate? Yes; Fluid consistency: Thin  Diet effective now                   Precautions / Restrictions Precautions Precautions: Fall, ICD/Pacemaker, Other (comment) (orthostatic) Precaution/Restrictions Comments: watch BP; premedicate Restrictions Weight Bearing Restrictions Per Provider Order: No   Has the patient had 2 or more falls or a fall with injury in the past year? Yes  Prior Activity Level Community (5-7x/wk): independent without AD  Prior Functional Level Self Care: Did the patient need help bathing, dressing, using the toilet or eating? Independent  Indoor Mobility: Did the patient need assistance with walking from room to room (with or without device)? Independent  Stairs: Did the patient need assistance with internal or external stairs (with or without device)? Independent  Functional Cognition: Did the patient need help planning regular tasks such as shopping or remembering to take medications? Independent  Patient Information Are you of Hispanic, Latino/a,or Spanish  origin?: A. No, not of Hispanic, Latino/a, or Spanish origin What is your race?: A. White ([place]) Do you need or want an interpreter to communicate with a doctor or health care staff?: 0. No  Patient's Response To:  Health Literacy and Transportation Is the patient able to respond to health literacy and transportation needs?: Yes Health Literacy - How often do you need to have someone help you when you read instructions, pamphlets, or other written material from your doctor or pharmacy?: Never In the past 12 months, has lack of transportation kept you from medical appointments or from getting medications?: No In the past 12 months, has lack of transportation kept you from meetings, work, or from getting things needed for daily living?: No  Journalist, newspaper / Equipment Home Equipment: Information systems manager, BSC/3in1  Prior Device Use: Indicate devices/aids used by the patient prior to current illness, exacerbation or injury? None of the above  Current Functional Level Cognition  Orientation Level: Oriented to person, Oriented to place, Disoriented to situation, Disoriented to time    Extremity Assessment (includes Sensation/Coordination)  Upper Extremity Assessment: Defer to OT evaluation  Lower Extremity Assessment: RLE deficits/detail, LLE deficits/detail RLE Deficits / Details: able to fully extend knee against gravity but it causes hip and knee pain LLE Deficits / Details: able to extend knee against gravity    ADLs  Overall ADL's : Needs assistance/impaired Eating/Feeding: Modified independent Grooming: Set up, Sitting Upper Body Bathing: Set up, Sitting Lower Body Bathing: Maximal assistance, Bed level Upper Body Dressing : Minimal assistance, Sitting Lower Body Dressing: Maximal assistance Toilet Transfer: +2 for safety/equipment, Minimal assistance Toilet Transfer Details (indicate cue type and reason): ambulated with nsg staff prior to session; near syncope event in  bathroom Functional mobility during ADLs: Maximal assistance    Mobility  Overal bed mobility: Needs Assistance Bed Mobility: Supine to Sit, Sit to Sidelying, Rolling Rolling: Max assist, +2 for physical assistance Supine to sit: Mod assist, HOB elevated, Used rails Sit to supine: Max assist Sit to sidelying: Min assist, Max assist General bed mobility comments: supine to sit: required mod A for proper sequencing, hand placement, attention to task, and squaring hips at EOB. Sit to sidelying and rolling: pt required min A for proper sequencing to rest on L elbow before max A x2 with helicopter manuever to supine and max A for LE elevation.    Transfers  Overall  transfer level: Needs assistance Equipment used: Rolling walker (2 wheels) Transfers: Sit to/from Stand Sit to Stand: Mod assist, +2 physical assistance, From elevated surface General transfer comment: pt completed 4 sit to stands, 1st from lowest bed height and 3 from an elevated height. For all transfers, pt reported increased hip and buttocks pain that limited her ability to fully extend her knees and trunk and she would sit back down. Pt was unable  to march in place s/t hip/buttocks pain    Ambulation / Gait / Stairs / Wheelchair Mobility  Ambulation/Gait General Gait Details: deferred s/t pt presentation    Posture / Balance Dynamic Sitting Balance Sitting balance - Comments: pt sits EOB without LOB Balance Overall balance assessment: Needs assistance, History of Falls Sitting-balance support: No upper extremity supported, Feet supported, Bilateral upper extremity supported Sitting balance-Leahy Scale: Fair Sitting balance - Comments: pt sits EOB without LOB Standing balance support: Reliant on assistive device for balance, During functional activity, Bilateral upper extremity supported Standing balance-Leahy Scale: Poor Standing balance comment: pt reliant on RW and therapist assistance for standing balance. Stands with  knees still flexed. No LOB    Special needs/care consideration Spouse manages medication pill box Family reports worsening of her memory Seizure precautions   Previous Home Environment  Living Arrangements:  (lives with spouse, youngest son to return to his own home; son recent brain surgery/seizures and needed Dad to provide supervision postop ( son returning to his own home))  Lives With: Spouse Available Help at Discharge: Family, Available 24 hours/day Type of Home: House Home Layout: One level Home Access: Stairs to enter Entrance Stairs-Rails: Left Entrance Stairs-Number of Steps: 2 Bathroom Shower/Tub: Health visitor: Handicapped height Bathroom Accessibility: Yes How Accessible: Accessible via wheelchair Home Care Services: No Additional Comments: dtr out of town, son in [place], Spouse is her caregiver  Discharge Living Setting Plans for Discharge Living Setting: Patient's home, Lives with (comment) (Spouse) Type of Home at Discharge: House Discharge Home Layout: One level Discharge Home Access: Stairs to enter Entrance Stairs-Rails: Left Entrance Stairs-Number of Steps: 2 Discharge Bathroom Shower/Tub: Walk-in shower Discharge Bathroom Toilet: Handicapped height Discharge Bathroom Accessibility: Yes How Accessible: Accessible via walker Does the patient have any problems obtaining your medications?: No  Social/Family/Support Systems Patient Roles: Spouse, Parent Contact Information: spouse, Royston Cornea Anticipated Caregiver: spouse Anticipated Industrial/product designer Information: see contacts Ability/Limitations of Caregiver: spouse has back issues, can not "lift" her Caregiver Availability: 24/7 Discharge Plan Discussed with Primary Caregiver: Yes Is Caregiver In Agreement with Plan?: Yes Does Caregiver/Family have Issues with Lodging/Transportation while Pt is in Rehab?: No  Goals Patient/Family Goal for Rehab: supervision PT, superivsion to min OT,  supervision SLP Expected length of stay: ELOS 10 to 14 days Additional Information: SPouse manages her medication pill box system Pt/Family Agrees to Admission and willing to participate: Yes Program Orientation Provided & Reviewed with Pt/Caregiver Including Roles  & Responsibilities: Yes  Decrease burden of Care through IP rehab admission: n/a  Possible need for SNF placement upon discharge: not anticipated  Patient Condition: I have reviewed medical records from [facility], spoken with  patient, spouse, and son. I met with patient at the bedside for inpatient rehabilitation assessment.  Patient will benefit from ongoing PT and OT, can actively participate in 3 hours of therapy a day 5 days of the week, and can make measurable gains during the admission.  Patient will also benefit from the coordinated team approach during an Inpatient Acute  Rehabilitation admission.  The patient will receive intensive therapy as well as Rehabilitation physician, nursing, social worker, and care management interventions.  Due to bladder management, bowel management, safety, skin/wound care, disease management, medication administration, pain management, and patient education the patient requires 24 hour a day rehabilitation nursing.  The patient is currently mod A with mobility and basic ADLs.  Discharge setting and therapy post discharge at home with home health is anticipated.  Patient has agreed to participate in the Acute Inpatient Rehabilitation Program and will admit today.  Preadmission Screen Completed By:  Tanya Fantasia RN MSN 10/19/2023 2:40 PM ______________________________________________________________________   Discussed status with Dr. Rachel Budds  on 10/22/23 at 900 and received approval for admission today.  Admission Coordinator:  Tanya Fantasia, RN MSN time 1055/Date 10/22/23   Assessment/Plan: Diagnosis: right sacral ala and left pubic rami fx's Does the need for close,  24 hr/day Medical supervision in concert with the patient's rehab needs make it unreasonable for this patient to be served in a less intensive setting? Yes Co-Morbidities requiring supervision/potential complications: dementia, CHB, CAD, GIB Due to bladder management, bowel management, safety, skin/wound care, disease management, medication administration, pain management, and patient education, does the patient require 24 hr/day rehab nursing? Yes Does the patient require coordinated care of a physician, rehab nurse, PT, OT, and SLP to address physical and functional deficits in the context of the above medical diagnosis(es)? Yes Addressing deficits in the following areas: balance, endurance, locomotion, strength, transferring, bowel/bladder control, bathing, dressing, feeding, grooming, toileting, cognition, and psychosocial support Can the patient actively participate in an intensive therapy program of at least 3 hrs of therapy 5 days a week? Yes The potential for patient to make measurable gains while on inpatient rehab is excellent Anticipated functional outcomes upon discharge from inpatient rehab: supervision PT, supervision and min assist OT, supervision SLP Estimated rehab length of stay to reach the above functional goals is: 10-14 days Anticipated discharge destination: Home 10. Overall Rehab/Functional Prognosis: excellent   MD Signature: Rawland Caddy, MD, [facility] Physical Medicine & Rehabilitation Medical Director Rehabilitation Services 10/22/2023

## 2023-10-19 NOTE — Progress Notes (Signed)
    Assessment & Plan: GLF L sacral ala and L superior pubic rami fx - WBAT per Dr. Guyann Leitz   Hypothyroidism Epilepsy Dementia  Hx of complete heart block s/p PPM Hx of gastric ulcer Fibromyalgia  Hx of colon cancer s/p resection in 2013 CAD s/p CABG and stents  - holding anticoagulation, Hgb 8.4 today   FEN: reg diet, SLIV, bowel regimen VTE: SCDs, possibly resume effient  if hgb remains stable ID: no current abx   Dispo: 4NP, pain control and therapies. Anticipating CIR next 1-4 days.        Rachel Billow, MD [facility] practice Office: [phone]        Chief Complaint: GLF   Subjective: Patient in bed, eating lunch.  Family at bedside.  No complaints.  Objective: Vital signs in last 24 hours: Temp:  [97.8 F (36.6 C)-98.7 F (37.1 C)] 98.7 F (37.1 C) (04/18 1234) Pulse Rate:  [80-101] 94 (04/18 1234) Resp:  [16-21] 16 (04/18 1234) BP: (99-156)/(53-96) 140/65 (04/18 1234) SpO2:  [94 %-100 %] 97 % (04/18 1234) Last BM Date : 10/19/23  Intake/Output from previous day: 04/17 0701 - 04/18 0700 In: 120 [P.O.:120] Out: 200 [Urine:200] Intake/Output this shift: Total I/O In: 240 [P.O.:240] Out: -   Physical Exam: HEENT - sclerae clear, mucous membranes moist Abdomen - soft, non-tender  Lab Results:  Recent Labs    10/17/23 1950 10/18/23 0547  WBC 8.5 8.0  HGB 8.7* 8.4*  HCT 27.3* 26.1*  PLT 332 314   BMET Recent Labs    10/17/23 0930 10/17/23 0943 10/18/23 0547  NA 137 136 134*  K 4.3 4.2 4.0  CL 104 103 104  CO2 24  --  22  GLUCOSE 119* 114* 129*  BUN 15 16 11   CREATININE 0.80 0.80 0.66  CALCIUM  8.3*  --  8.4*   PT/INR Recent Labs    10/17/23 0930  LABPROT 14.9  INR 1.2   Comprehensive Metabolic Panel:    Component Value Date/Time   NA 134 (L) 10/18/2023 0547   NA 136 10/17/2023 0943   K 4.0 10/18/2023 0547   K 4.2 10/17/2023 0943   CL 104 10/18/2023 0547   CL 103 10/17/2023 0943   CO2 22  10/18/2023 0547   CO2 24 10/17/2023 0930   BUN 11 10/18/2023 0547   BUN 16 10/17/2023 0943   CREATININE 0.66 10/18/2023 0547   CREATININE 0.80 10/17/2023 0943   GLUCOSE 129 (H) 10/18/2023 0547   GLUCOSE 114 (H) 10/17/2023 0943   CALCIUM  8.4 (L) 10/18/2023 0547   CALCIUM  8.3 (L) 10/17/2023 0930   AST 19 10/17/2023 0930   ALT 12 10/17/2023 0930   ALKPHOS 47 10/17/2023 0930   BILITOT 0.4 10/17/2023 0930   PROT 5.8 (L) 10/17/2023 0930   ALBUMIN 3.0 (L) 10/17/2023 0930    Studies/Results: No results found.    Rachel Clark 10/19/2023  Patient ID: Rachel Clark, female   DOB: [birth year], 73 y.o.   MRN: [id number]

## 2023-10-19 NOTE — Progress Notes (Signed)
  Inpatient Rehabilitation Admissions Coordinator   Met with patient, spouse and son at bedside for rehab assessment. We discussed goals and expectations of a possible CIR admit. They prefer CIR for rehab. Family can provide expected caregiver support that is recommended. Dr Emeline in agreement to admit when medically ready. We will follow up to arrange at that time.Please call me with any questions.   Heron Leavell, RN, MSN Rehab Admissions Coordinator [phone]

## 2023-10-19 NOTE — Plan of Care (Signed)
 Pt is pleasantly confused, redirectable and follows commands only for a few minutes. Forgetful. Today was very productive day, pt used BSC, a few times, sat in the chair, and had a bath, all which helped to ease some anxiety and agitation. Extremely weak and needs 2-3 people to stand up. Family at the bedside, spoke to Rehab coordinator, trauma surgery MD today. All questions were answered.  Pt A/o x2, Ra, VS stable, denied pain for most of the day. Bed in low position, alarms are on, call bell in reach. Soil scientist. Very frequent checking on, due to memory.

## 2023-10-19 NOTE — TOC Initial Note (Signed)
 Transition of Care [facility]) - Initial/Assessment Note    Patient Details  Name: Rachel Clark MRN: [id number] Date of Birth: [birth year]  Transition of Care [facility]) CM/SW Contact:    Ranulfo Kall M, RN Phone Number: 10/19/2023, 2:50 PM  Clinical Narrative:                 73 yo s/p fall with resulting R sacral ALA fx, L sup/inf pubic rami fx .  Prior to admission, patient requires assistance with ADLs and lives at home with spouse and adult son.  PT/OT recommending inpatient rehab, and family is agreeable with this plan.  Family able to provide needed support after discharge from rehab. Plan transfer to inpatient rehab upon medical stability and bed availability.  Will follow.  Expected Discharge Plan: IP Rehab Facility Barriers to Discharge: Continued Medical Work up              Expected Discharge Plan and Services   Discharge Planning Services: CM Consult Post Acute Care Choice: IP Rehab Living arrangements for the past 2 months: Single Family Home                                      Prior Living Arrangements/Services Living arrangements for the past 2 months: Single Family Home Lives with:: Spouse Patient language and need for interpreter reviewed:: Yes Do you feel safe going back to the place where you live?: Yes      Need for Family Participation in Patient Care: Yes (Comment) Care giver support system in place?: Yes (comment)   Criminal Activity/Legal Involvement Pertinent to Current Situation/Hospitalization: No - Comment as needed                 Emotional Assessment   Attitude/Demeanor/Rapport: Engaged Affect (typically observed): Accepting Orientation: : Oriented to Self, Oriented to Place      Admission diagnosis:  Pelvic fracture (HCC) [S32.9XXA] Fall, initial encounter [id number]] Closed nondisplaced fracture of pelvis, unspecified part of pelvis, initial encounter (HCC) [S32.9XXA] Patient Active Problem List   Diagnosis Date Noted    Pelvic fracture (HCC) 10/17/2023   PCP:  Victorio Grave, MD Pharmacy:   [facility] # [address], [place] - [address] [place] [address] Phone: [phone] Fax: [phone]     Social Drivers of Health (SDOH) Social History:   SDOH Interventions:     Readmission Risk Interventions     No data to display         Calla Catchings, RN, BSN  Trauma/Neuro ICU Case Manager [phone]

## 2023-10-19 NOTE — Plan of Care (Signed)

## 2023-10-19 NOTE — Consult Note (Signed)
 Requested chart review to evaluate for CIR candidacy.  Patient is a 73 year old female presenting for 16 status post fall with back and leg pain.  Found to have a right sacral alla and left pubic rami fractures, managed nonoperatively with weightbearing as tolerated per Dr. Guyann Leitz.  Hospitalization otherwise complicated by hypertension, constipation, delirium overlying pre-existing dementia, and history of CAD with stents currently with anticoagulation held.   Per chart review, she lives with her husband in a single level home and requires assistance for IADLs due to dementia, but is otherwise independent.  Recently, patient's son moved into assist, however unfortunately received brain surgery which limits his ability to help.  Currently, she is max assist for bed mobility due to pain, mod a x 2 for transfers with a rolling walker, and 2+ assist for ambulation short distances limited by orthostasis.   Provided husband and son can provide physical support at a min assist level, patient is a good potential candidate for CIR once medically stable.   Bea Lime, DO 10/19/2023

## 2023-10-20 LAB — BASIC METABOLIC PANEL WITH GFR
Anion gap: 7 (ref 5–15)
BUN: 12 mg/dL (ref 8–23)
CO2: 25 mmol/L (ref 22–32)
Calcium: 8.2 mg/dL — ABNORMAL LOW (ref 8.9–10.3)
Chloride: 98 mmol/L (ref 98–111)
Creatinine, Ser: 0.67 mg/dL (ref 0.44–1.00)
GFR, Estimated: >60 mL/min (ref >60)
Glucose, Bld: 133 mg/dL — ABNORMAL HIGH (ref 70–99)
Potassium: 4 mmol/L (ref 3.5–5.1)
Sodium: 130 mmol/L — ABNORMAL LOW (ref 135–145)

## 2023-10-20 LAB — CBC
HCT: 23.7 % — ABNORMAL LOW (ref 36.0–46.0)
Hemoglobin: 7.8 g/dL — ABNORMAL LOW (ref 12.0–15.0)
MCH: 28.6 pg (ref 26.0–34.0)
MCHC: 32.9 g/dL (ref 30.0–36.0)
MCV: 86.8 fL (ref 80.0–100.0)
Platelets: 279 10*3/uL (ref 150–400)
RBC: 2.73 MIL/uL — ABNORMAL LOW (ref 3.87–5.11)
RDW: 22.2 % — ABNORMAL HIGH (ref 11.5–15.5)
WBC: 11.8 10*3/uL — ABNORMAL HIGH (ref 4.0–10.5)
nRBC: 0 % (ref 0.0–0.2)

## 2023-10-20 LAB — LIPID PANEL
Cholesterol: 131 mg/dL (ref 0–200)
HDL: 64 mg/dL (ref >40)
LDL Cholesterol: 50 mg/dL (ref 0–99)
Total CHOL/HDL Ratio: 2 ratio
Triglycerides: 86 mg/dL (ref <150)
VLDL: 17 mg/dL (ref 0–40)

## 2023-10-20 NOTE — Progress Notes (Addendum)
    Assessment & Plan: GLF L sacral ala and L superior pubic rami fx - WBAT per Dr. Guyann Leitz   Hypothyroidism Epilepsy Dementia  Hx of complete heart block s/p PPM Hx of gastric ulcer Fibromyalgia  Hx of colon cancer s/p resection in 2013 CAD s/p CABG and stents  - holding anticoagulation, husband also reports hx GIB while on this medication.    FEN: reg diet, SLIV, bowel regimen VTE: SCDs, possibly resume effient  if hgb remains stable ID: no current abx   Dispo: 4NP, pain control and therapies. Anticipating CIR next 1-4 days. Check CBC, BMP today. Husband concerned about patient missing her rapatha medication which has helped her cholesterol. Due to give her the injection (which they keep at home) tomorrow. I have asked pharmacy and we do not carry that medication. Will order lipid panel. If normal then will offer reassurance to patient that holding this medication will be ok in the short term.        Michial Akin, PA-C [facility] Please see Amion for pager number during day hours 7:00am-4:30pm             Chief Complaint: GLF   Subjective: Patient in bed, ate most of her breakfast. No complaints. Her husband and son are at the bedside  Objective: Vital signs in last 24 hours: Temp:  [97.6 F (36.4 C)-98.7 F (37.1 C)] 97.9 F (36.6 C) (04/19 0733) Pulse Rate:  [94-100] 95 (04/19 0733) Resp:  [15-20] 17 (04/19 0733) BP: (115-147)/(56-84) 125/56 (04/19 0733) SpO2:  [96 %-100 %] 100 % (04/19 0733) Last BM Date : 10/19/23  Intake/Output from previous day: 04/18 0701 - 04/19 0700 In: 240 [P.O.:240] Out: 1125 [Urine:1125] Intake/Output this shift: No intake/output data recorded.  Physical Exam: HEENT - sclerae clear, mucous membranes moist Abdomen - soft, non-tender  Lab Results:  Recent Labs    10/17/23 1950 10/18/23 0547  WBC 8.5 8.0  HGB 8.7* 8.4*  HCT 27.3* 26.1*  PLT 332 314   BMET Recent Labs    10/18/23 0547  NA 134*  K 4.0   CL 104  CO2 22  GLUCOSE 129*  BUN 11  CREATININE 0.66  CALCIUM  8.4*   PT/INR No results for input(s): "LABPROT", "INR" in the last 72 hours.  Comprehensive Metabolic Panel:    Component Value Date/Time   NA 134 (L) 10/18/2023 0547   NA 136 10/17/2023 0943   K 4.0 10/18/2023 0547   K 4.2 10/17/2023 0943   CL 104 10/18/2023 0547   CL 103 10/17/2023 0943   CO2 22 10/18/2023 0547   CO2 24 10/17/2023 0930   BUN 11 10/18/2023 0547   BUN 16 10/17/2023 0943   CREATININE 0.66 10/18/2023 0547   CREATININE 0.80 10/17/2023 0943   GLUCOSE 129 (H) 10/18/2023 0547   GLUCOSE 114 (H) 10/17/2023 0943   CALCIUM  8.4 (L) 10/18/2023 0547   CALCIUM  8.3 (L) 10/17/2023 0930   AST 19 10/17/2023 0930   ALT 12 10/17/2023 0930   ALKPHOS 47 10/17/2023 0930   BILITOT 0.4 10/17/2023 0930   PROT 5.8 (L) 10/17/2023 0930   ALBUMIN 3.0 (L) 10/17/2023 0930    Studies/Results: No results found.    Claudis Cumber Mackayla Mullins 10/20/2023  Patient ID: Rachel Clark, female   DOB: 28-Jan-1951, 73 y.o.   MRN: [id number]

## 2023-10-21 ENCOUNTER — Encounter (HOSPITAL_COMMUNITY): Payer: Self-pay

## 2023-10-21 LAB — CBC
HCT: 23.5 % — ABNORMAL LOW (ref 36.0–46.0)
Hemoglobin: 7.5 g/dL — ABNORMAL LOW (ref 12.0–15.0)
MCH: 28.2 pg (ref 26.0–34.0)
MCHC: 31.9 g/dL (ref 30.0–36.0)
MCV: 88.3 fL (ref 80.0–100.0)
Platelets: 286 10*3/uL (ref 150–400)
RBC: 2.66 MIL/uL — ABNORMAL LOW (ref 3.87–5.11)
RDW: 22 % — ABNORMAL HIGH (ref 11.5–15.5)
WBC: 8.9 10*3/uL (ref 4.0–10.5)
nRBC: 0 % (ref 0.0–0.2)

## 2023-10-21 MED ORDER — SODIUM CHLORIDE 1 G PO TABS
1.0000 g | ORAL_TABLET | Freq: Three times a day (TID) | ORAL | Status: DC
Start: 2023-10-21 — End: 2023-10-22
  Administered 2023-10-21 – 2023-10-22 (×4): 1 g via ORAL
  Filled 2023-10-21 (×4): qty 1

## 2023-10-21 NOTE — Progress Notes (Signed)
    Assessment & Plan: GLF L sacral ala and L superior pubic rami fx - WBAT per Dr. Guyann Leitz   Acute on chronic anemia - has iron  infusion outpatient scheduled for tomorrow, monitor hgb, may need iron  transfusion here Hypothyroidism Epilepsy Dementia  Hx of complete heart block s/p PPM Hx of gastric ulcer Fibromyalgia  Hx of colon cancer s/p resection in 2013 CAD s/p CABG and stents  - holding anticoagulation, husband also reports hx GIB while on this medication.   - lipid panel WNL 4/19. Hold repatha  for now   FEN: reg diet, SLIV, bowel regimen; hyponatremia (130 yesterday), salt tabs TID, BMP pending VTE: SCDs, possibly resume effient  if hgb remains stable ID: no current abx   Dispo: 4NP, pain control and therapies. Anticipating CIR next 1-4 days. Check CBC, BMP today.         Michial Akin, PA-C [facility] Please see Amion for pager number during day hours 7:00am-4:30pm             Chief Complaint: GLF   Subjective: Patient in bed, ate all of breakfast. No complaints. Her husband and son are at the bedside  Objective: Vital signs in last 24 hours: Temp:  [97.7 F (36.5 C)-98.4 F (36.9 C)] 97.7 F (36.5 C) (04/20 0732) Pulse Rate:  [86-92] 89 (04/20 0300) Resp:  [18-20] 18 (04/20 0300) BP: (118-148)/(51-83) 142/68 (04/20 0839) SpO2:  [96 %-98 %] 98 % (04/20 0300) Last BM Date : 10/21/22  Intake/Output from previous day: 04/19 0701 - 04/20 0700 In: 240 [P.O.:240] Out: 850 [Urine:850] Intake/Output this shift: No intake/output data recorded.  Physical Exam: HEENT - sclerae clear, mucous membranes moist Abdomen - soft, non-tender  Lab Results:  Recent Labs    10/20/23 1146  WBC 11.8*  HGB 7.8*  HCT 23.7*  PLT 279   BMET Recent Labs    10/20/23 1146  NA 130*  K 4.0  CL 98  CO2 25  GLUCOSE 133*  BUN 12  CREATININE 0.67  CALCIUM  8.2*   PT/INR No results for input(s): "LABPROT", "INR" in the last 72  hours.  Comprehensive Metabolic Panel:    Component Value Date/Time   NA 130 (L) 10/20/2023 1146   NA 134 (L) 10/18/2023 0547   K 4.0 10/20/2023 1146   K 4.0 10/18/2023 0547   CL 98 10/20/2023 1146   CL 104 10/18/2023 0547   CO2 25 10/20/2023 1146   CO2 22 10/18/2023 0547   BUN 12 10/20/2023 1146   BUN 11 10/18/2023 0547   CREATININE 0.67 10/20/2023 1146   CREATININE 0.66 10/18/2023 0547   GLUCOSE 133 (H) 10/20/2023 1146   GLUCOSE 129 (H) 10/18/2023 0547   CALCIUM  8.2 (L) 10/20/2023 1146   CALCIUM  8.4 (L) 10/18/2023 0547   AST 19 10/17/2023 0930   ALT 12 10/17/2023 0930   ALKPHOS 47 10/17/2023 0930   BILITOT 0.4 10/17/2023 0930   PROT 5.8 (L) 10/17/2023 0930   ALBUMIN 3.0 (L) 10/17/2023 0930    Studies/Results: No results found.    Rachel Clark 10/21/2023  Patient ID: Rachel Clark, female   DOB: 1951-05-28, 73 y.o.   MRN: [id number]

## 2023-10-22 ENCOUNTER — Encounter: Payer: Self-pay | Admitting: Hematology and Oncology

## 2023-10-22 ENCOUNTER — Encounter (HOSPITAL_COMMUNITY): Payer: Self-pay | Admitting: Physical Medicine and Rehabilitation

## 2023-10-22 ENCOUNTER — Inpatient Hospital Stay (HOSPITAL_COMMUNITY)
Admission: AD | Admit: 2023-10-22 | Discharge: 2023-11-06 | DRG: 560 | Disposition: A | Discharge status: Skilled Nursing Facility | Source: Intra-hospital | Attending: Physical Medicine & Rehabilitation | Admitting: Physical Medicine & Rehabilitation

## 2023-10-22 ENCOUNTER — Ambulatory Visit

## 2023-10-22 ENCOUNTER — Other Ambulatory Visit: Payer: Self-pay

## 2023-10-22 DIAGNOSIS — D509 Iron deficiency anemia, unspecified: Secondary | ICD-10-CM | POA: Diagnosis not present

## 2023-10-22 DIAGNOSIS — B9620 Unspecified Escherichia coli [E. coli] as the cause of diseases classified elsewhere: Secondary | ICD-10-CM | POA: Diagnosis not present

## 2023-10-22 DIAGNOSIS — E785 Hyperlipidemia, unspecified: Secondary | ICD-10-CM | POA: Diagnosis not present

## 2023-10-22 DIAGNOSIS — S32110D Nondisplaced Zone I fracture of sacrum, subsequent encounter for fracture with routine healing: Secondary | ICD-10-CM

## 2023-10-22 DIAGNOSIS — D649 Anemia, unspecified: Secondary | ICD-10-CM

## 2023-10-22 DIAGNOSIS — Z886 Allergy status to analgesic agent status: Secondary | ICD-10-CM | POA: Diagnosis not present

## 2023-10-22 DIAGNOSIS — Z8673 Personal history of transient ischemic attack (TIA), and cerebral infarction without residual deficits: Secondary | ICD-10-CM | POA: Diagnosis not present

## 2023-10-22 DIAGNOSIS — M797 Fibromyalgia: Secondary | ICD-10-CM | POA: Diagnosis not present

## 2023-10-22 DIAGNOSIS — W19XXXD Unspecified fall, subsequent encounter: Secondary | ICD-10-CM | POA: Diagnosis present

## 2023-10-22 DIAGNOSIS — E7800 Pure hypercholesterolemia, unspecified: Secondary | ICD-10-CM | POA: Diagnosis present

## 2023-10-22 DIAGNOSIS — S3219XD Other fracture of sacrum, subsequent encounter for fracture with routine healing: Secondary | ICD-10-CM | POA: Diagnosis not present

## 2023-10-22 DIAGNOSIS — M545 Low back pain, unspecified: Secondary | ICD-10-CM | POA: Diagnosis not present

## 2023-10-22 DIAGNOSIS — R338 Other retention of urine: Secondary | ICD-10-CM | POA: Diagnosis present

## 2023-10-22 DIAGNOSIS — R2681 Unsteadiness on feet: Secondary | ICD-10-CM | POA: Diagnosis not present

## 2023-10-22 DIAGNOSIS — Z79899 Other long term (current) drug therapy: Secondary | ICD-10-CM | POA: Diagnosis not present

## 2023-10-22 DIAGNOSIS — M79604 Pain in right leg: Secondary | ICD-10-CM | POA: Diagnosis present

## 2023-10-22 DIAGNOSIS — Z1611 Resistance to penicillins: Secondary | ICD-10-CM | POA: Diagnosis not present

## 2023-10-22 DIAGNOSIS — Z79890 Hormone replacement therapy (postmenopausal): Secondary | ICD-10-CM | POA: Diagnosis not present

## 2023-10-22 DIAGNOSIS — I2510 Atherosclerotic heart disease of native coronary artery without angina pectoris: Secondary | ICD-10-CM | POA: Diagnosis present

## 2023-10-22 DIAGNOSIS — R32 Unspecified urinary incontinence: Secondary | ICD-10-CM | POA: Diagnosis present

## 2023-10-22 DIAGNOSIS — Z85038 Personal history of other malignant neoplasm of large intestine: Secondary | ICD-10-CM | POA: Diagnosis not present

## 2023-10-22 DIAGNOSIS — M25551 Pain in right hip: Secondary | ICD-10-CM | POA: Diagnosis not present

## 2023-10-22 DIAGNOSIS — E039 Hypothyroidism, unspecified: Secondary | ICD-10-CM | POA: Diagnosis present

## 2023-10-22 DIAGNOSIS — Z950 Presence of cardiac pacemaker: Secondary | ICD-10-CM

## 2023-10-22 DIAGNOSIS — G40909 Epilepsy, unspecified, not intractable, without status epilepticus: Secondary | ICD-10-CM | POA: Diagnosis present

## 2023-10-22 DIAGNOSIS — R159 Full incontinence of feces: Secondary | ICD-10-CM | POA: Diagnosis not present

## 2023-10-22 DIAGNOSIS — K5901 Slow transit constipation: Secondary | ICD-10-CM | POA: Diagnosis not present

## 2023-10-22 DIAGNOSIS — S32512A Fracture of superior rim of left pubis, initial encounter for closed fracture: Secondary | ICD-10-CM | POA: Diagnosis not present

## 2023-10-22 DIAGNOSIS — Z951 Presence of aortocoronary bypass graft: Secondary | ICD-10-CM

## 2023-10-22 DIAGNOSIS — E871 Hypo-osmolality and hyponatremia: Secondary | ICD-10-CM

## 2023-10-22 DIAGNOSIS — S3289XD Fracture of other parts of pelvis, subsequent encounter for fracture with routine healing: Secondary | ICD-10-CM | POA: Diagnosis not present

## 2023-10-22 DIAGNOSIS — T402X5A Adverse effect of other opioids, initial encounter: Secondary | ICD-10-CM | POA: Diagnosis not present

## 2023-10-22 DIAGNOSIS — Z7401 Bed confinement status: Secondary | ICD-10-CM | POA: Diagnosis not present

## 2023-10-22 DIAGNOSIS — M7989 Other specified soft tissue disorders: Secondary | ICD-10-CM | POA: Diagnosis not present

## 2023-10-22 DIAGNOSIS — K5903 Drug induced constipation: Secondary | ICD-10-CM | POA: Diagnosis not present

## 2023-10-22 DIAGNOSIS — Z5320 Procedure and treatment not carried out because of patient's decision for unspecified reasons: Secondary | ICD-10-CM | POA: Diagnosis not present

## 2023-10-22 DIAGNOSIS — R2689 Other abnormalities of gait and mobility: Secondary | ICD-10-CM | POA: Diagnosis not present

## 2023-10-22 DIAGNOSIS — R41841 Cognitive communication deficit: Secondary | ICD-10-CM | POA: Diagnosis not present

## 2023-10-22 DIAGNOSIS — I495 Sick sinus syndrome: Secondary | ICD-10-CM | POA: Diagnosis not present

## 2023-10-22 DIAGNOSIS — G8929 Other chronic pain: Secondary | ICD-10-CM | POA: Diagnosis not present

## 2023-10-22 DIAGNOSIS — Z955 Presence of coronary angioplasty implant and graft: Secondary | ICD-10-CM

## 2023-10-22 DIAGNOSIS — F0390 Unspecified dementia without behavioral disturbance: Secondary | ICD-10-CM | POA: Diagnosis present

## 2023-10-22 DIAGNOSIS — S32592D Other specified fracture of left pubis, subsequent encounter for fracture with routine healing: Secondary | ICD-10-CM | POA: Diagnosis not present

## 2023-10-22 DIAGNOSIS — S3282XD Multiple fractures of pelvis without disruption of pelvic ring, subsequent encounter for fracture with routine healing: Secondary | ICD-10-CM

## 2023-10-22 DIAGNOSIS — R569 Unspecified convulsions: Secondary | ICD-10-CM | POA: Diagnosis not present

## 2023-10-22 DIAGNOSIS — K5900 Constipation, unspecified: Secondary | ICD-10-CM | POA: Diagnosis not present

## 2023-10-22 DIAGNOSIS — S32512D Fracture of superior rim of left pubis, subsequent encounter for fracture with routine healing: Secondary | ICD-10-CM | POA: Diagnosis not present

## 2023-10-22 DIAGNOSIS — S3289XA Fracture of other parts of pelvis, initial encounter for closed fracture: Secondary | ICD-10-CM | POA: Diagnosis not present

## 2023-10-22 DIAGNOSIS — M6281 Muscle weakness (generalized): Secondary | ICD-10-CM | POA: Diagnosis not present

## 2023-10-22 DIAGNOSIS — R4189 Other symptoms and signs involving cognitive functions and awareness: Secondary | ICD-10-CM | POA: Diagnosis present

## 2023-10-22 DIAGNOSIS — Y92239 Unspecified place in hospital as the place of occurrence of the external cause: Secondary | ICD-10-CM | POA: Diagnosis not present

## 2023-10-22 DIAGNOSIS — K219 Gastro-esophageal reflux disease without esophagitis: Secondary | ICD-10-CM | POA: Diagnosis not present

## 2023-10-22 DIAGNOSIS — N390 Urinary tract infection, site not specified: Secondary | ICD-10-CM | POA: Diagnosis not present

## 2023-10-22 DIAGNOSIS — Z7982 Long term (current) use of aspirin: Secondary | ICD-10-CM | POA: Diagnosis not present

## 2023-10-22 DIAGNOSIS — I10 Essential (primary) hypertension: Secondary | ICD-10-CM | POA: Diagnosis not present

## 2023-10-22 DIAGNOSIS — F32A Depression, unspecified: Secondary | ICD-10-CM | POA: Diagnosis not present

## 2023-10-22 DIAGNOSIS — R339 Retention of urine, unspecified: Secondary | ICD-10-CM | POA: Diagnosis not present

## 2023-10-22 DIAGNOSIS — N281 Cyst of kidney, acquired: Secondary | ICD-10-CM | POA: Diagnosis not present

## 2023-10-22 DIAGNOSIS — Z7902 Long term (current) use of antithrombotics/antiplatelets: Secondary | ICD-10-CM | POA: Diagnosis not present

## 2023-10-22 DIAGNOSIS — S32301A Unspecified fracture of right ilium, initial encounter for closed fracture: Secondary | ICD-10-CM

## 2023-10-22 DIAGNOSIS — I25810 Atherosclerosis of coronary artery bypass graft(s) without angina pectoris: Secondary | ICD-10-CM | POA: Diagnosis not present

## 2023-10-22 DIAGNOSIS — S32391D Other fracture of right ilium, subsequent encounter for fracture with routine healing: Secondary | ICD-10-CM | POA: Diagnosis not present

## 2023-10-22 DIAGNOSIS — S329XXA Fracture of unspecified parts of lumbosacral spine and pelvis, initial encounter for closed fracture: Principal | ICD-10-CM | POA: Diagnosis present

## 2023-10-22 DIAGNOSIS — R1312 Dysphagia, oropharyngeal phase: Secondary | ICD-10-CM | POA: Diagnosis not present

## 2023-10-22 LAB — BASIC METABOLIC PANEL WITH GFR
Anion gap: 9 (ref 5–15)
BUN: 17 mg/dL (ref 8–23)
CO2: 21 mmol/L — ABNORMAL LOW (ref 22–32)
Calcium: 8.5 mg/dL — ABNORMAL LOW (ref 8.9–10.3)
Chloride: 103 mmol/L (ref 98–111)
Creatinine, Ser: 0.67 mg/dL (ref 0.44–1.00)
GFR, Estimated: >60 mL/min (ref >60)
Glucose, Bld: 95 mg/dL (ref 70–99)
Potassium: 4.3 mmol/L (ref 3.5–5.1)
Sodium: 133 mmol/L — ABNORMAL LOW (ref 135–145)

## 2023-10-22 LAB — RETICULOCYTES
Immature Retic Fract: 31.1 % — ABNORMAL HIGH (ref 2.3–15.9)
RBC.: 2.87 MIL/uL — ABNORMAL LOW (ref 3.87–5.11)
Retic Count, Absolute: 176.5 10*3/uL (ref 19.0–186.0)
Retic Ct Pct: 6.2 % — ABNORMAL HIGH (ref 0.4–3.1)

## 2023-10-22 LAB — FERRITIN: Ferritin: 156 ng/mL (ref 11–307)

## 2023-10-22 LAB — IRON AND TIBC
Iron: 145 ug/dL (ref 28–170)
Saturation Ratios: 61 % — ABNORMAL HIGH (ref 10.4–31.8)
TIBC: 239 ug/dL — ABNORMAL LOW (ref 250–450)
UIBC: 94 ug/dL

## 2023-10-22 LAB — VITAMIN B12: Vitamin B-12: 1123 pg/mL — ABNORMAL HIGH (ref 180–914)

## 2023-10-22 LAB — FOLATE: Folate: 8.7 ng/mL (ref >5.9)

## 2023-10-22 MED ORDER — ASPIRIN 81 MG PO TBEC
81.0000 mg | DELAYED_RELEASE_TABLET | Freq: Every day | ORAL | Status: DC
  Administered 2023-10-23 – 2023-11-06 (×15): 81 mg via ORAL
  Filled 2023-10-22 (×15): qty 1

## 2023-10-22 MED ORDER — LEVOTHYROXINE SODIUM 50 MCG PO TABS
50.0000 ug | ORAL_TABLET | Freq: Every day | ORAL | Status: DC
  Administered 2023-10-23 – 2023-11-06 (×15): 50 ug via ORAL
  Filled 2023-10-22 (×15): qty 1

## 2023-10-22 MED ORDER — ENOXAPARIN SODIUM 30 MG/0.3ML IJ SOSY
30.0000 mg | PREFILLED_SYRINGE | Freq: Two times a day (BID) | INTRAMUSCULAR | Status: DC
Start: 2023-10-22 — End: 2023-10-22

## 2023-10-22 MED ORDER — POLYETHYLENE GLYCOL 3350 17 G PO PACK
17.0000 g | PACK | Freq: Two times a day (BID) | ORAL | Status: DC
  Administered 2023-10-22 – 2023-11-06 (×28): 17 g via ORAL
  Filled 2023-10-22 (×31): qty 1

## 2023-10-22 MED ORDER — CARBAMAZEPINE ER 200 MG PO TB12
600.0000 mg | ORAL_TABLET | Freq: Two times a day (BID) | ORAL | Status: DC
  Administered 2023-10-22 – 2023-11-06 (×30): 600 mg via ORAL
  Filled 2023-10-22 (×32): qty 3

## 2023-10-22 MED ORDER — PANTOPRAZOLE SODIUM 40 MG PO TBEC
40.0000 mg | DELAYED_RELEASE_TABLET | Freq: Two times a day (BID) | ORAL | Status: DC
  Administered 2023-10-22 – 2023-11-06 (×30): 40 mg via ORAL
  Filled 2023-10-22 (×30): qty 1

## 2023-10-22 MED ORDER — FLUTICASONE PROPIONATE 50 MCG/ACT NA SUSP
1.0000 | Freq: Every day | NASAL | Status: DC
Start: 2023-10-23 — End: 2023-11-06
  Administered 2023-10-23 – 2023-11-06 (×15): 1 via NASAL
  Filled 2023-10-22: qty 16

## 2023-10-22 MED ORDER — CALCIUM CARBONATE ANTACID 500 MG PO CHEW
1.0000 | CHEWABLE_TABLET | Freq: Every day | ORAL | Status: DC
  Administered 2023-10-23 – 2023-11-06 (×15): 200 mg via ORAL
  Filled 2023-10-22 (×15): qty 1

## 2023-10-22 MED ORDER — ISOSORBIDE MONONITRATE ER 30 MG PO TB24
15.0000 mg | ORAL_TABLET | Freq: Every day | ORAL | Status: DC
  Administered 2023-10-23 – 2023-11-06 (×15): 15 mg via ORAL
  Filled 2023-10-22 (×15): qty 1

## 2023-10-22 MED ORDER — VITAMIN D 25 MCG (1000 UNIT) PO TABS
4000.0000 [IU] | ORAL_TABLET | Freq: Every day | ORAL | Status: DC
  Administered 2023-10-23 – 2023-11-06 (×15): 4000 [IU] via ORAL
  Filled 2023-10-22 (×15): qty 4

## 2023-10-22 MED ORDER — AMLODIPINE BESYLATE 5 MG PO TABS
5.0000 mg | ORAL_TABLET | Freq: Every day | ORAL | Status: DC
Start: 2023-10-23 — End: 2023-10-23
  Administered 2023-10-23: 5 mg via ORAL
  Filled 2023-10-22: qty 1

## 2023-10-22 MED ORDER — DOCUSATE SODIUM 100 MG PO CAPS
100.0000 mg | ORAL_CAPSULE | Freq: Two times a day (BID) | ORAL | Status: DC
  Administered 2023-10-22 – 2023-11-06 (×28): 100 mg via ORAL
  Filled 2023-10-22 (×30): qty 1

## 2023-10-22 MED ORDER — ENOXAPARIN SODIUM 30 MG/0.3ML IJ SOSY
30.0000 mg | PREFILLED_SYRINGE | Freq: Two times a day (BID) | INTRAMUSCULAR | Status: DC
  Administered 2023-10-22 – 2023-11-06 (×30): 30 mg via SUBCUTANEOUS
  Filled 2023-10-22 (×30): qty 0.3

## 2023-10-22 MED ORDER — ETHOSUXIMIDE 250 MG PO CAPS
500.0000 mg | ORAL_CAPSULE | Freq: Two times a day (BID) | ORAL | Status: DC
  Administered 2023-10-22 – 2023-11-06 (×30): 500 mg via ORAL
  Filled 2023-10-22 (×31): qty 2

## 2023-10-22 MED ORDER — OXYCODONE HCL 5 MG PO TABS
2.5000 mg | ORAL_TABLET | ORAL | Status: DC | PRN
  Administered 2023-10-22 – 2023-10-26 (×9): 5 mg via ORAL
  Administered 2023-10-26: 2.5 mg via ORAL
  Administered 2023-10-26 – 2023-10-29 (×6): 5 mg via ORAL
  Filled 2023-10-22 (×17): qty 1

## 2023-10-22 MED ORDER — ACETAMINOPHEN 325 MG PO TABS
325.0000 mg | ORAL_TABLET | ORAL | Status: DC | PRN
Start: 2023-10-22 — End: 2023-10-27
  Administered 2023-10-22 – 2023-10-26 (×3): 650 mg via ORAL
  Filled 2023-10-22 (×5): qty 2

## 2023-10-22 MED ORDER — IRON SUCROSE 500 MG IVPB - SIMPLE MED
500.0000 mg | Freq: Once | INTRAVENOUS | Status: DC
  Filled 2023-10-22: qty 275

## 2023-10-22 MED ORDER — ONDANSETRON 4 MG PO TBDP
4.0000 mg | ORAL_TABLET | Freq: Four times a day (QID) | ORAL | Status: DC | PRN
  Administered 2023-10-28 – 2023-10-30 (×2): 4 mg via ORAL
  Filled 2023-10-22 (×2): qty 1

## 2023-10-22 MED ORDER — ONDANSETRON HCL 4 MG/2ML IJ SOLN: 4.0000 mg | Freq: Four times a day (QID) | INTRAMUSCULAR | Status: DC | PRN

## 2023-10-22 MED ORDER — SODIUM CHLORIDE 0.9 % IV SOLN
500.0000 mg | Freq: Once | INTRAVENOUS | Status: DC
  Administered 2023-10-22: 500 mg via INTRAVENOUS
  Filled 2023-10-22: qty 25

## 2023-10-22 MED ORDER — DULOXETINE HCL 60 MG PO CPEP
60.0000 mg | ORAL_CAPSULE | Freq: Two times a day (BID) | ORAL | Status: DC
  Administered 2023-10-22 – 2023-11-06 (×30): 60 mg via ORAL
  Filled 2023-10-22 (×30): qty 1

## 2023-10-22 MED ORDER — MEMANTINE HCL 10 MG PO TABS
10.0000 mg | ORAL_TABLET | Freq: Two times a day (BID) | ORAL | Status: DC
  Administered 2023-10-22 – 2023-11-06 (×30): 10 mg via ORAL
  Filled 2023-10-22 (×30): qty 1

## 2023-10-22 MED ORDER — VITAMIN B-12 1000 MCG PO TABS
1000.0000 ug | ORAL_TABLET | Freq: Every day | ORAL | Status: DC
  Administered 2023-10-23 – 2023-11-06 (×15): 1000 ug via ORAL
  Filled 2023-10-22 (×15): qty 1

## 2023-10-22 MED ORDER — SODIUM CHLORIDE 1 G PO TABS
1.0000 g | ORAL_TABLET | Freq: Three times a day (TID) | ORAL | Status: DC
  Administered 2023-10-22 – 2023-11-02 (×32): 1 g via ORAL
  Filled 2023-10-22 (×31): qty 1

## 2023-10-22 MED ORDER — LIDOCAINE 5 % EX PTCH
2.0000 | MEDICATED_PATCH | CUTANEOUS | Status: DC
  Administered 2023-10-23 – 2023-11-06 (×15): 2 via TRANSDERMAL
  Filled 2023-10-22 (×15): qty 2

## 2023-10-22 MED ORDER — FERROUS SULFATE 325 (65 FE) MG PO TABS
325.0000 mg | ORAL_TABLET | Freq: Every day | ORAL | Status: DC
  Administered 2023-10-23 – 2023-11-06 (×15): 325 mg via ORAL
  Filled 2023-10-22 (×15): qty 1

## 2023-10-22 NOTE — H&P (Signed)
 Physical Medicine and Rehabilitation Admission H&P    Chief Complaint  Patient presents with   Loss of Consciousness   Fall    FOT  : HPI: Rachel Clark is a 73 year old right-handed female with history of dementia maintained on Namenda , hypothyroidism, complete heart block status post permanent pacemaker as well as history of CABG with stents followed by cardiology service Dr. [place] maintained on low-dose aspirin , hypertension, history of GI bleed/gastric ulcer, epilepsy maintained on Tegretol , chronic anemia followed by hematology services Dr. Marton Sleeper as well as colon cancer.  Per chart review lives with husband and son.  Independent mobility but needed assistance due to dementia and safety awareness.  She was rather sedentary prior to admission.  Presented 10/17/2023 after fall.  No report of loss of consciousness.  Admission chemistries unremarkable except glucose 119, hemoglobin 8.9, WBC 13,500.  Cranial CT scan negative for acute changes.  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  CT cervical spine negative.  X-rays and CT imaging of the pelvis showed comminuted minimally displaced fracture of the left pubic body with extension along the left superior pubic ramus.  Nondisplaced fracture of the right sacral ala.  Follow-up orthopedic service Dr. Kevan Peers nonoperative weightbearing as tolerated.  Hospital course in regards to patient's acute on chronic anemia she was receiving iron infusions as an outpatient which was administered 10/22/2023 while in the hospital.  Hemoglobin ranges from 7.5-8.7.  She was cleared to continue her low-dose aspirin  therapy.  Lovenox  added for DVT prophylaxis 10/19/2023.  He did have some hyponatremia 130-134 and was placed on sodium chloride  tablets 1 g 3 times daily 10/21/2023.  Patient on Effient  as well as Repatha prior to admission remains on hold.  Therapy evaluations completed due to patient's decreased functional mobility was admitted for  a comprehensive rehab program.  Review of Systems  Constitutional:  Negative for chills and fever.  HENT:  Negative for hearing loss.   Eyes:  Negative for blurred vision and double vision.  Respiratory:  Negative for cough, shortness of breath and wheezing.   Cardiovascular:  Negative for chest pain, palpitations and leg swelling.  Gastrointestinal:  Positive for constipation. Negative for heartburn, nausea and vomiting.  Genitourinary:  Negative for dysuria, flank pain and hematuria.  Musculoskeletal:  Positive for falls and myalgias.  Skin:  Negative for rash.  Neurological:  Positive for seizures.  Psychiatric/Behavioral:  Positive for memory loss. The patient has insomnia.   All other systems reviewed and are negative.  Past Medical History:  Diagnosis Date   Dementia (HCC)    History reviewed. No pertinent surgical history. History reviewed. No pertinent family history. Social History:  reports that she has never smoked. She has never used smokeless tobacco. She reports that she does not drink alcohol and does not use drugs. Allergies: No Known Allergies Medications Prior to Admission  Medication Sig Dispense Refill   acetaminophen  (TYLENOL ) 500 MG tablet Take 1,000 mg by mouth every 6 (six) hours as needed for mild pain (pain score 1-3).     amLODipine  (NORVASC ) 5 MG tablet Take 5 mg by mouth daily.     aspirin  EC 81 MG tablet Take 81 mg by mouth daily. Swallow whole.     calcium  carbonate (TUMS - DOSED IN MG ELEMENTAL CALCIUM ) 500 MG chewable tablet Chew 1 tablet by mouth daily.     carbamazepine  (TEGRETOL  XR) 200 MG 12 hr tablet Take 600 mg by mouth 2 (two) times daily.  DULoxetine  (CYMBALTA ) 60 MG capsule Take 60 mg by mouth 2 (two) times daily.     ethosuximide  (ZARONTIN ) 250 MG capsule Take 500 mg by mouth 2 (two) times daily.     isosorbide  mononitrate (IMDUR ) 30 MG 24 hr tablet Take 15 mg by mouth daily.     levothyroxine  (SYNTHROID ) 50 MCG tablet Take 50 mcg by  mouth daily.     memantine  (NAMENDA ) 10 MG tablet Take 10 mg by mouth 2 (two) times daily.     nitroGLYCERIN (NITROSTAT) 0.4 MG SL tablet Place 0.4 mg under the tongue every 5 (five) minutes as needed for chest pain.     pantoprazole  (PROTONIX ) 40 MG tablet Take 40 mg by mouth 2 (two) times daily.     prasugrel  (EFFIENT ) 10 MG TABS tablet Take 10 mg by mouth daily.     REPATHA SURECLICK 140 MG/ML SOAJ Inject 140 mg into the skin every 14 (fourteen) days.        Home: Home Living Family/patient expects to be discharged to:: Private residence Living Arrangements: Spouse/significant other, Children Available Help at Discharge: Family, Available 24 hours/day Type of Home: House Home Access: Stairs to enter Secretary/administrator of Steps: 2 Entrance Stairs-Rails: Left Home Layout: One level Bathroom Shower/Tub: Health visitor: Handicapped height Bathroom Accessibility: Yes Home Equipment: Information systems manager, BSC/3in1 Additional Comments: dtr out of town, son in [place], Spouse is her caregiver  Lives With: Spouse   Functional History: Prior Function Prior Level of Function : Independent/Modified Independent, Needs assist Mobility Comments: independent ADLs Comments: requires assistance for all IADL tasks  Functional Status:  Mobility: Bed Mobility Overal bed mobility: Needs Assistance Bed Mobility: Supine to Sit, Sit to Sidelying, Rolling Rolling: Max assist, +2 for physical assistance Supine to sit: Mod assist, HOB elevated, Used rails Sit to supine: Max assist Sit to sidelying: Min assist, Max assist General bed mobility comments: supine to sit: required mod A for proper sequencing, hand placement, attention to task, and squaring hips at EOB. Sit to sidelying and rolling: pt required min A for proper sequencing to rest on L elbow before max A x2 with helicopter manuever to supine and max A for LE elevation. Transfers Overall transfer level: Needs  assistance Equipment used: Rolling walker (2 wheels) Transfers: Sit to/from Stand Sit to Stand: Mod assist, +2 physical assistance, From elevated surface General transfer comment: pt completed 4 sit to stands, 1st from lowest bed height and 3 from an elevated height. For all transfers, pt reported increased hip and buttocks pain that limited her ability to fully extend her knees and trunk and she would sit back down. Pt was unable  to march in place s/t hip/buttocks pain Ambulation/Gait General Gait Details: deferred s/t pt presentation    ADL: ADL Overall ADL's : Needs assistance/impaired Eating/Feeding: Modified independent Grooming: Set up, Sitting Upper Body Bathing: Set up, Sitting Lower Body Bathing: Maximal assistance, Bed level Upper Body Dressing : Minimal assistance, Sitting Lower Body Dressing: Maximal assistance Toilet Transfer: +2 for safety/equipment, Minimal assistance Toilet Transfer Details (indicate cue type and reason): ambulated with nsg staff prior to session; near syncope event in bathroom Functional mobility during ADLs: Maximal assistance  Cognition: Cognition Orientation Level: Disoriented to situation, Disoriented to time, Disoriented to place, Oriented to person Cognition Arousal: Alert Behavior During Therapy: [facility] for tasks assessed/performed  Physical Exam: Blood pressure (!) 157/71, pulse 95, temperature 98.1 F (36.7 C), temperature source Oral, resp. rate 17, height 5\' 8"  (1.727 m), weight  80.1 kg, SpO2 99%. Physical Exam Constitutional:      General: She is not in acute distress.    Appearance: She is not ill-appearing.  HENT:     Head: Normocephalic and atraumatic.     Right Ear: External ear normal.     Left Ear: External ear normal.     Nose: Nose normal.  Eyes:     Extraocular Movements: Extraocular movements intact.     Conjunctiva/sclera: Conjunctivae normal.     Pupils: Pupils are equal, round, and reactive to light.   Cardiovascular:     Rate and Rhythm: Normal rate and regular rhythm.     Heart sounds: No murmur heard.    No gallop.  Pulmonary:     Effort: Pulmonary effort is normal. No respiratory distress.     Breath sounds: Normal breath sounds. No wheezing.  Abdominal:     General: Bowel sounds are normal. There is no distension.     Tenderness: There is no abdominal tenderness.  Musculoskeletal:        General: Swelling and tenderness present.     Cervical back: Normal range of motion and neck supple.  Skin:    General: Skin is warm.     Findings: Bruising present.  Neurological:     Comments: Pt is awake and alert. Follows basic commands. Answers biographical questions, some times very matter-of-factly. Fair insight and awareness, short term memory although husband interjects in frequently during conversation. CN exam non-focal. MMT: 4+ to 5/5 bilateral upper ext. RLE 3/5 HF, KE and 4/5 ADF/PF> LLE 2-/5 HF, KE (pain component) and 4/5 APF/ADF. Sensory exam normal for light touch and pain in all 4 limbs. No limb ataxia or cerebellar signs. No abnormal tone appreciated.    Psychiatric:     Comments: Pt a bit dry but pleasant and cooperative.      Results for orders placed or performed during the hospital encounter of 10/17/23 (from the past 48 hours)  CBC     Status: Abnormal   Collection Time: 10/20/23 11:46 AM  Result Value Ref Range   WBC 11.8 (H) 4.0 - 10.5 K/uL   RBC 2.73 (L) 3.87 - 5.11 MIL/uL   Hemoglobin 7.8 (L) 12.0 - 15.0 g/dL   HCT 16.1 (L) 09.6 - 04.5 %   MCV 86.8 80.0 - 100.0 fL   MCH 28.6 26.0 - 34.0 pg   MCHC 32.9 30.0 - 36.0 g/dL   RDW 40.9 (H) 81.1 - 91.4 %   Platelets 279 150 - 400 K/uL   nRBC 0.0 0.0 - 0.2 %    Comment: Performed at [facility], [address], [place], [place] [address]  Basic metabolic panel     Status: Abnormal   Collection Time: 10/20/23 11:46 AM  Result Value Ref Range   Sodium 130 (L) 135 - 145 mmol/L   Potassium 4.0 3.5 - 5.1 mmol/L    Chloride 98 98 - 111 mmol/L   CO2 25 22 - 32 mmol/L   Glucose, Bld 133 (H) 70 - 99 mg/dL    Comment: Glucose reference range applies only to samples taken after fasting for at least 8 hours.   BUN 12 8 - 23 mg/dL   Creatinine, Ser 6.21 0.44 - 1.00 mg/dL   Calcium  8.2 (L) 8.9 - 10.3 mg/dL   GFR, Estimated >30 >86 mL/min    Comment: (NOTE) Calculated using the CKD-EPI Creatinine Equation (2021)    Anion gap 7 5 - 15  Comment: Performed at [facility], [address], [place], [place] [address]  Lipid panel     Status: None   Collection Time: 10/20/23  7:08 PM  Result Value Ref Range   Cholesterol 131 0 - 200 mg/dL   Triglycerides 86 <604 mg/dL   HDL 64 >54 mg/dL   Total CHOL/HDL Ratio 2.0 RATIO   VLDL 17 0 - 40 mg/dL   LDL Cholesterol 50 0 - 99 mg/dL    Comment:        Total Cholesterol/HDL:CHD Risk Coronary Heart Disease Risk Table                     Men   Women  1/2 Average Risk   3.4   3.3  Average Risk       5.0   4.4  2 X Average Risk   9.6   7.1  3 X Average Risk  23.4   11.0        Use the calculated Patient Ratio above and the CHD Risk Table to determine the patient's CHD Risk.        ATP III CLASSIFICATION (LDL):  <100     mg/dL   Optimal  098-119  mg/dL   Near or Above                    Optimal  130-159  mg/dL   Borderline  147-829  mg/dL   High  >562     mg/dL   Very High Performed at [facility], [address], [place], [place] [address]   CBC     Status: Abnormal   Collection Time: 10/21/23  3:43 PM  Result Value Ref Range   WBC 8.9 4.0 - 10.5 K/uL   RBC 2.66 (L) 3.87 - 5.11 MIL/uL   Hemoglobin 7.5 (L) 12.0 - 15.0 g/dL   HCT 57.8 (L) 46.9 - 62.9 %   MCV 88.3 80.0 - 100.0 fL   MCH 28.2 26.0 - 34.0 pg   MCHC 31.9 30.0 - 36.0 g/dL   RDW 52.8 (H) 41.3 - 24.4 %   Platelets 286 150 - 400 K/uL   nRBC 0.0 0.0 - 0.2 %    Comment: Performed at [facility], [address], [place], [place] [address]   No results  found.    Blood pressure (!) 157/71, pulse 95, temperature 98.1 F (36.7 C), temperature source Oral, resp. rate 17, height 5\' 8"  (1.727 m), weight 80.1 kg, SpO2 99%.  Medical Problem List and Plan: 1. Functional deficits secondary to left sacral ala and superior pubic rami fracture.  Weightbearing as tolerated per Dr. Guyann Leitz  -patient may shower  -ELOS/Goals: 10-14 days, supervision goals with PT, and sup/min assist with OT and SLP(eval only?). 2.  Antithrombotics: -DVT/anticoagulation:  Pharmaceutical: Lovenox .  Check vascular study  -antiplatelet therapy: Aspirin  81 mg daily 3. Pain Management: Lidoderm  patch, oxycodone  as needed 4. Mood/Behavior/Sleep: Namenda  10 mg twice daily, Cymbalta  60 mg twice daily  -antipsychotic agents: N/A 5. Neuropsych/cognition: This patient is not quite capable of making decisions on her own behalf. 6. Skin/Wound Care: Routine skin checks 7. Fluids/Electrolytes/Nutrition: Routine in and outs with follow-up chemistries on admission 8.  Acute on chronic anemia/history of GI bleed/colon cancer.  Patient receives outpatient iron infusions.  Continue iron supplement.  Follow-up per hematology services Dr.Gorsuch 9.  Epilepsy.  Tegretol  600 mg twice daily as well as zarontin  500 mg twice  daily. 10.  CAD/CABG/history of stents/PPM.  Continue low-dose aspirin .  Follow-up cardiology service Dr. [place].  Continue Imdur  15 mg daily 11.  Hypothyroidism.  Synthroid  12.  Hyponatremia.  Sodium chloride  tablets 1 g 3 times daily.  Follow-up chemistries tomorrow 13.Hypertension.Norvasc  5 mg daily.Monitor with increased mobility  -bp borderline at present.  Everlyn Hockey Angiulli, PA-C 10/22/2023

## 2023-10-22 NOTE — Progress Notes (Signed)
 Physical Therapy Treatment Patient Details Name: Rachel Clark MRN: [id number] DOB: 02/04/1951 Today's Date: 10/22/2023   History of Present Illness 73 yo presenting 10/17/23 with back and leg pain s/p fall. Admitted R sacral ALA and L sup/inf pubic rami fractures. [id number], Epilepsy, heart block s/p PPM, fibromyalgia, colon CA, CABG.    PT Comments  Pt progressing towards her physical therapy goals; still limited in her standing tolerance and progressing ambulation distance due to pain. Pt requiring moderate assist for bed mobility and minimal assist for transfers using RW. Able to perform peri care with close guarding assist after toileting. Patient will benefit from intensive inpatient follow-up therapy, >3 hours/day.    If plan is discharge home, recommend the following: A lot of help with bathing/dressing/bathroom;Assistance with cooking/housework;Direct supervision/assist for medications management;Direct supervision/assist for financial management;Assist for transportation;Help with stairs or ramp for entrance;A lot of help with walking and/or transfers   Can travel by private vehicle     Yes  Equipment Recommendations  Rolling walker (2 wheels);Wheelchair (measurements PT);Wheelchair cushion (measurements PT)    Recommendations for Other Services       Precautions / Restrictions Precautions Precautions: Fall Recall of Precautions/Restrictions: Impaired Restrictions Weight Bearing Restrictions Per Provider Order: No     Mobility  Bed Mobility Overal bed mobility: Needs Assistance Bed Mobility: Supine to Sit     Supine to sit: Mod assist     General bed mobility comments: Assist for initiation    Transfers Overall transfer level: Needs assistance Equipment used: Rolling walker (2 wheels) Transfers: Sit to/from Stand, Bed to chair/wheelchair/BSC Sit to Stand: Min assist, Contact guard assist   Step pivot transfers: Min assist, +2 safety/equipment        General transfer comment: MinA to stand from edge of bed to recliner, cues for hand placement. Pivotal transfer to [facility] and then to chair. Progressing to CGA to stand from recliner    Ambulation/Gait                   Stairs             Wheelchair Mobility     Tilt Bed    Modified Rankin (Stroke Patients Only)       Balance Overall balance assessment: Needs assistance, History of Falls Sitting-balance support: No upper extremity supported, Feet supported, Bilateral upper extremity supported Sitting balance-Leahy Scale: Fair     Standing balance support: Bilateral upper extremity supported, During functional activity, Reliant on assistive device for balance Standing balance-Leahy Scale: Poor Standing balance comment: reliant on RW                            Communication Communication Communication: No apparent difficulties  Cognition Arousal: Alert Behavior During Therapy: WFL for tasks assessed/performed   PT - Cognitive impairments: History of cognitive impairments, Orientation                         Following commands: Intact      Cueing Cueing Techniques: Verbal cues, Tactile cues, Visual cues  Exercises      General Comments        Pertinent Vitals/Pain Pain Assessment Pain Assessment: Faces Faces Pain Scale: Hurts even more Pain Location: groin, hip, right ankle Pain Descriptors / Indicators: Discomfort, Grimacing, Aching, Guarding Pain Intervention(s): Limited activity within patient's tolerance, Monitored during session    Home Living  Prior Function            PT Goals (current goals can now be found in the care plan section) Acute Rehab PT Goals Potential to Achieve Goals: Good Progress towards PT goals: Progressing toward goals    Frequency    Min 2X/week      PT Plan      Co-evaluation              AM-PAC PT "6 Clicks" Mobility   Outcome Measure   Help needed turning from your back to your side while in a flat bed without using bedrails?: A Lot Help needed moving from lying on your back to sitting on the side of a flat bed without using bedrails?: A Lot Help needed moving to and from a bed to a chair (including a wheelchair)?: A Little Help needed standing up from a chair using your arms (e.g., wheelchair or bedside chair)?: A Little Help needed to walk in hospital room?: Total Help needed climbing 3-5 steps with a railing? : Total 6 Click Score: 12    End of Session Equipment Utilized During Treatment: Gait belt Activity Tolerance: Patient limited by pain Patient left: in chair;with call bell/phone within reach;with chair alarm set Nurse Communication: Mobility status PT Visit Diagnosis: Unsteadiness on feet (R26.81);Other abnormalities of gait and mobility (R26.89);Muscle weakness (generalized) (M62.81);History of falling (Z91.81);Difficulty in walking, not elsewhere classified (R26.2);Other symptoms and signs involving the nervous system (R29.898);Pain Pain - part of body:  (pelvis)     Time: 1610-9604 PT Time Calculation (min) (ACUTE ONLY): 29 min  Charges:    $Therapeutic Activity: 23-37 mins PT General Charges $$ ACUTE PT VISIT: 1 Visit                     Verdia Glad, PT, DPT Acute Rehabilitation Services Office [phone]    Claria Crofts 10/22/2023, 1:05 PM

## 2023-10-22 NOTE — Plan of Care (Signed)

## 2023-10-22 NOTE — Progress Notes (Signed)
 Occupational Therapy Treatment Patient Details Name: Rachel Clark MRN: [id number] DOB: 1950-08-07 Today's Date: 10/22/2023   History of present illness 73 yo presenting 10/17/23 with back and leg pain s/p fall. Admitted R sacral ALA and L sup/inf pubic rami fractures. [id number], Epilepsy, heart block s/p PPM, fibromyalgia, colon CA, CABG.   OT comments  Patient received up in recliner with no complaints of pain. Patient was mod assist to stand from recliner and performed reaching tasks with complaints of pain when standing, limiting standing tolerance. Patient attempted transfer to [facility] but was unable to perform due to pain and demonstrated urinary incontinence. Patient performed LB bathing and dressing and gown change seated and stood for peri area bathing. Patient able to assist with bathing when seated but limited when standing. Patient will benefit from intensive inpatient follow-up therapy, >3 hours/day. Acute OT to continue to follow to address established goals to facilitate DC to next venue of care.       If plan is discharge home, recommend the following:  Two people to help with walking and/or transfers;A lot of help with bathing/dressing/bathroom;Assistance with cooking/housework;Assist for transportation;Help with stairs or ramp for entrance   Equipment Recommendations  Other (comment) (RW)    Recommendations for Other Services Rehab consult    Precautions / Restrictions Precautions Precautions: Fall;ICD/Pacemaker;Other (comment) (orthostatic) Recall of Precautions/Restrictions: Impaired Precaution/Restrictions Comments: watch BP; premedicate Restrictions Weight Bearing Restrictions Per Provider Order: No       Mobility Bed Mobility Overal bed mobility: Needs Assistance             General bed mobility comments: OOB in recliner    Transfers Overall transfer level: Needs assistance Equipment used: Rolling walker (2 wheels) Transfers: Sit to/from  Stand Sit to Stand: Mod assist           General transfer comment: cues for hand placement and mod assist to power up to RW from recliner     Balance Overall balance assessment: Needs assistance, History of Falls Sitting-balance support: No upper extremity supported, Feet supported, Bilateral upper extremity supported Sitting balance-Leahy Scale: Fair     Standing balance support: Single extremity supported, Bilateral upper extremity supported, During functional activity, Reliant on assistive device for balance Standing balance-Leahy Scale: Poor Standing balance comment: stood from recliner and able to perform reaching task with limited standing tolerance due to pain                           ADL either performed or assessed with clinical judgement   ADL Overall ADL's : Needs assistance/impaired     Grooming: Set up;Sitting Grooming Details (indicate cue type and reason): attempted standing but patient states too painful     Lower Body Bathing: Moderate assistance;Maximal assistance;Sit to/from stand Lower Body Bathing Details (indicate cue type and reason): able to wash legs down towards ankle, required assistance for feet and peri area when standing Upper Body Dressing : Minimal assistance;Sitting Upper Body Dressing Details (indicate cue type and reason): gown Lower Body Dressing: Maximal assistance Lower Body Dressing Details (indicate cue type and reason): socks             Functional mobility during ADLs: Maximal assistance General ADL Comments: stood from recliner to simulate standing grooming tasks. Attempted to address [facility] transfer but patient had urinary incontinance before performing transfers and required assistance with LB bathing and gown/socks change    Extremity/Trunk Assessment  Vision       Perception     Praxis     Communication Communication Communication: No apparent difficulties   Cognition Arousal:  Alert Behavior During Therapy: WFL for tasks assessed/performed Cognition: History of cognitive impairments             OT - Cognition Comments: history of dementia                 Following commands: Intact        Cueing   Cueing Techniques: Verbal cues, Tactile cues, Visual cues  Exercises      Shoulder Instructions       General Comments      Pertinent Vitals/ Pain       Pain Assessment Pain Assessment: Faces Faces Pain Scale: Hurts even more Pain Location: groin, hip, right ankle Pain Descriptors / Indicators: Discomfort, Grimacing, Aching, Guarding Pain Intervention(s): Limited activity within patient's tolerance, Monitored during session, Repositioned, Patient requesting pain meds-RN notified  Home Living                                          Prior Functioning/Environment              Frequency  Min 2X/week        Progress Toward Goals  OT Goals(current goals can now be found in the care plan section)  Progress towards OT goals: Progressing toward goals  Acute Rehab OT Goals Patient Stated Goal: go to rehab OT Goal Formulation: With patient/family Time For Goal Achievement: 11/01/23 Potential to Achieve Goals: Good ADL Goals Pt Will Perform Lower Body Bathing: with min assist;sit to/from stand Pt Will Perform Lower Body Dressing: with min assist;sit to/from stand Pt Will Transfer to Toilet: with min assist;ambulating;bedside commode Pt Will Perform Toileting - Clothing Manipulation and hygiene: with contact guard assist;sitting/lateral leans Additional ADL Goal #1: Bed mobility with min A in preparation for ADL tasks  Plan      Co-evaluation                 AM-PAC OT "6 Clicks" Daily Activity     Outcome Measure   Help from another person eating meals?: None Help from another person taking care of personal grooming?: A Little Help from another person toileting, which includes using toliet, bedpan, or  urinal?: Total Help from another person bathing (including washing, rinsing, drying)?: A Lot Help from another person to put on and taking off regular upper body clothing?: A Little Help from another person to put on and taking off regular lower body clothing?: A Lot 6 Click Score: 15    End of Session Equipment Utilized During Treatment: Gait belt;Rolling walker (2 wheels)  OT Visit Diagnosis: Unsteadiness on feet (R26.81);Other abnormalities of gait and mobility (R26.89);Muscle weakness (generalized) (M62.81);History of falling (Z91.81);Other symptoms and signs involving cognitive function;Pain Pain - Right/Left: Left Pain - part of body: Leg (pelvis)   Activity Tolerance Patient tolerated treatment well;Patient limited by pain   Patient Left in chair;with call bell/phone within reach;with chair alarm set;with family/visitor present   Nurse Communication Mobility status;Patient requests pain meds        Time: 1105-1136 OT Time Calculation (min): 31 min  Charges: OT General Charges $OT Visit: 1 Visit OT Treatments $Self Care/Home Management : 8-22 mins $Therapeutic Activity: 8-22 mins  Anitra Barn, OTA Acute Rehabilitation Services  Office [phone]  Jovita Nipper 10/22/2023, 12:52 PM

## 2023-10-22 NOTE — Discharge Summary (Addendum)
 Physician Discharge Summary  Patient ID: Rachel Clark MRN: [id number] DOB/AGE: 01-Sep-1950 73 y.o.  Admit date: 10/22/2023 Discharge date: 11/06/2023  Discharge Diagnoses:  Principal Problem:   Pelvic fracture [facility]) DVT prophylaxis Dementia Acute on chronic anemia/history of GI bleed/colon cancer Epilepsy CAD/CABG/history of stent/PPM Hypothyroidism Hyponatremia Hypertension Constipation UTI/urinary retention    Discharged Condition: Stable  Significant Diagnostic Studies: DG Pelvis 1-2 Views Result Date: 11/02/2023 CLINICAL DATA:  Pelvic fracture.  Right hip pain. EXAM: PELVIS - 1-2 VIEW COMPARISON:  One-view abdomen and pelvis. CT of the abdomen pelvis 10/17/2023. FINDINGS: Is scratched at left superior pubic ramus and pubic symphysis fracture is again noted. No new fractures are present. IMPRESSION: 1. Left superior pubic ramus and pubic symphysis fractures. 2. No new fractures. Electronically Signed   By: Audree Leas M.D.   On: 11/02/2023 14:01   US  RENAL Result Date: 11/01/2023 CLINICAL DATA:  Urinary retention EXAM: RENAL / URINARY TRACT ULTRASOUND COMPLETE COMPARISON:  CT abdomen 10/17/2023 FINDINGS: Right Kidney: Renal measurements: 9.5 by 4.5 by 4.9 cm = volume: 110 mL. Echogenicity within normal limits. No mass or hydronephrosis visualized. Left Kidney: Renal measurements: 10.5 by 5.1 by 4.3 cm = volume: 119 mL. Echogenicity within normal limits. No mass or hydronephrosis visualized. Incidental left peripelvic cysts. Bladder: Appears normal for degree of bladder distention. Bilateral ureteral jets visualized. Other: None. IMPRESSION: 1. No significant renal abnormality observed. 2. Incidental small left peripelvic cysts. Electronically Signed   By: Freida Jes M.D.   On: 11/01/2023 17:53   DG Abd 1 View Result Date: 10/31/2023 CLINICAL DATA:  Constipation. EXAM: ABDOMEN - 1 VIEW COMPARISON:  None Available. FINDINGS: Large amount of colonic stool identified  in the upper abdomen, transverse colon. Less distally. There is some air in the rectum. Scattered small bowel gas. No frank obstruction. There are 3 linear metallic foci overlying loops of colon in the right upper quadrant. Please correlate for clips or other process. Scattered vascular calcifications. Sternal wires are seen at the edge of the imaging field. The most inferior sternal wire is fractured. Pacemaker leads as well. There is a known fracture along the medial left pubic bone. Is also a bony excrescence from the iliac crest laterally on the left. Possible osteo chondroma. Unchanged from prior pelvic x-ray of 10/17/2023. degenerative changes of the spine. IMPRESSION: Nonspecific bowel gas pattern. Significant stool along the ascending and transverse colon. Three metallic foci overlie the right upper quadrant, right side of the colon. Please correlate for any known clips or instrumentation. Known left-sided pubic bone fracture. Electronically Signed   By: Adrianna Horde M.D.   On: 10/31/2023 16:19   DG HIP UNILAT WITH PELVIS 2-3 VIEWS RIGHT Result Date: 10/26/2023 CLINICAL DATA:  Right hip pain.  Known pelvic fracture. EXAM: DG HIP (WITH OR WITHOUT PELVIS) 2-3V RIGHT COMPARISON:  CT chest, abdomen, and pelvis 10/17/2023, AP pelvis 10/17/2023 FINDINGS: There is diffuse decreased bone mineralization. Redemonstration of comminuted fracture of the superior aspect of the left pubic body extending into the adjacent superior left pubic ramus with unchanged mild displacement of the cortical fragments. Again the nondisplaced fracture of the right sacral ala described on prior CT is not well visualized on radiographs. Mild bilateral sacroiliac subchondral sclerosis. IMPRESSION: 1. Redemonstration of comminuted fracture of the superior aspect of the left pubic body extending into the adjacent superior left pubic ramus with unchanged mild displacement of the cortical fragments. 2. Again the nondisplaced fracture of the  right sacral ala described on prior  CT is not well visualized on radiographs. Electronically Signed   By: Bertina Broccoli M.D.   On: 10/26/2023 19:02   VAS US  LOWER EXTREMITY VENOUS (DVT) Result Date: 10/23/2023  Lower Venous DVT Study Patient Name:  Rachel Clark  Date of Exam:   10/23/2023 Medical Rec #: [id number]          Accession #:    [id number] Date of Birth: 01-03-1951         Patient Gender: F Patient Age:   73 years Exam Location:  [facility] Procedure:      VAS US  LOWER EXTREMITY VENOUS (DVT) Referring Phys: Georjean Kite --------------------------------------------------------------------------------  Indications: Swelling, and Edema.  Risk Factors: Trauma Recent fall. Comparison Study: None. Performing Technologist: Estanislao Heimlich  Examination Guidelines: A complete evaluation includes B-mode imaging, spectral Doppler, color Doppler, and power Doppler as needed of all accessible portions of each vessel. Bilateral testing is considered an integral part of a complete examination. Limited examinations for reoccurring indications may be performed as noted. The reflux portion of the exam is performed with the patient in reverse Trendelenburg.  +---------+---------------+---------+-----------+----------+--------------+ RIGHT    CompressibilityPhasicitySpontaneityPropertiesThrombus Aging +---------+---------------+---------+-----------+----------+--------------+ CFV      Full           Yes      Yes                                 +---------+---------------+---------+-----------+----------+--------------+ SFJ      Full                                                        +---------+---------------+---------+-----------+----------+--------------+ FV Prox  Full                                                        +---------+---------------+---------+-----------+----------+--------------+ FV Mid   Full                                                         +---------+---------------+---------+-----------+----------+--------------+ FV DistalFull                                                        +---------+---------------+---------+-----------+----------+--------------+ PFV      Full                                                        +---------+---------------+---------+-----------+----------+--------------+ POP      Full           Yes      Yes                                 +---------+---------------+---------+-----------+----------+--------------+  PTV      Full                    Yes                                 +---------+---------------+---------+-----------+----------+--------------+ PERO     Full                    Yes                                 +---------+---------------+---------+-----------+----------+--------------+   +---------+---------------+---------+-----------+----------+--------------+ LEFT     CompressibilityPhasicitySpontaneityPropertiesThrombus Aging +---------+---------------+---------+-----------+----------+--------------+ CFV      Full           Yes      Yes                                 +---------+---------------+---------+-----------+----------+--------------+ SFJ      Full                                                        +---------+---------------+---------+-----------+----------+--------------+ FV Prox  Full                                                        +---------+---------------+---------+-----------+----------+--------------+ FV Mid   Full                                                        +---------+---------------+---------+-----------+----------+--------------+ FV DistalFull                                                        +---------+---------------+---------+-----------+----------+--------------+ PFV      Full                                                         +---------+---------------+---------+-----------+----------+--------------+ POP      Full           Yes      Yes                                 +---------+---------------+---------+-----------+----------+--------------+ PTV      Full                    Yes                                 +---------+---------------+---------+-----------+----------+--------------+  PERO     Full                    Yes                                 +---------+---------------+---------+-----------+----------+--------------+     Summary: BILATERAL: - No evidence of deep vein thrombosis seen in the lower extremities, bilaterally. -No evidence of popliteal cyst, bilaterally.   *See table(s) above for measurements and observations. Electronically signed by Angela Kell MD on 10/23/2023 at 5:12:16 PM.    Final    DG Chest Port 1 View Result Date: 10/17/2023 CLINICAL DATA:  Found down. EXAM: PORTABLE CHEST 1 VIEW COMPARISON:  Chest radiograph dated 02/03/2023. CT chest, abdomen, and pelvis dated 10/17/2023. FINDINGS: The heart size and mediastinal contours are within normal limits. 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 Mild left basilar scarring/atelectasis. No focal consolidation, pleural effusion, or pneumothorax. No acute osseous abnormality identified. IMPRESSION: Mild left basilar scarring/atelectasis. Otherwise, no acute findings in the chest. Electronically Signed   By: Mannie Seek M.D.   On: 10/17/2023 10:13   DG Pelvis Portable Result Date: 10/17/2023 CLINICAL DATA:  Found down. EXAM: PORTABLE PELVIS 1-2 VIEWS COMPARISON:  CT chest, abdomen, and pelvis dated 10/17/2023 at 9:22 a.m. FINDINGS: Comminuted minimally displaced fracture of the left pubic body with extension along the left superior pubic ramus is again noted. Irregularity of the right sacral ala corresponds to nondisplaced fracture of the right sacral ala, better visualized on the same-day CT.  Femoral heads are seated within the acetabulum. No dislocation. Mild degenerative changes of the bilateral hips. Degenerative changes of the visualized lower lumbar spine. Vascular calcifications are present. IMPRESSION: 1. Comminuted minimally displaced fracture of the left pubic body with extension along the left superior pubic ramus. 2. Nondisplaced fracture of the right sacral ala is better visualized on the same-day CT. Electronically Signed   By: Mannie Seek M.D.   On: 10/17/2023 10:10   CT HEAD WO CONTRAST ( ) Result Date: 10/17/2023 CLINICAL DATA:  Provided history: Polytrauma, blunt. Additional history provided: Fall. Hypotensive. EXAM: CT HEAD WITHOUT CONTRAST CT CERVICAL SPINE WITHOUT CONTRAST TECHNIQUE: Multidetector CT imaging of the head and cervical spine was performed following the standard protocol without intravenous contrast. Multiplanar CT image reconstructions of the cervical spine were also generated. RADIATION DOSE REDUCTION: This exam was performed according to the departmental dose-optimization program which includes automated exposure control, adjustment of the mA and/or kV according to patient size and/or use of iterative reconstruction technique. COMPARISON:  CT head 08/31/2023. Thyroid  ultrasound 11/12/2012. Thyroid  ultrasound 05/05/2013. FINDINGS: CT HEAD FINDINGS Brain: Generalized cerebral atrophy. Focus of chronic encephalomalacia/gliosis again demonstrated within the anterior left temporal lobe. Chronic lacunar infarct again demonstrated within the left subinsular white matter. Mild-to-moderate patchy and ill-defined hypoattenuation elsewhere within the cerebral white matter, nonspecific but compatible chronic small vessel disease. There is no acute intracranial hemorrhage. No acute demarcated cortical infarct. No extra-axial fluid collection. No evidence of an intracranial mass. No midline shift. Vascular: No hyperdense vessel.  Atherosclerotic calcifications. Skull: No  calvarial fracture or aggressive osseous lesion. Sinuses/Orbits: No mass or acute finding within the imaged orbits. No significant paranasal sinus disease at the imaged levels. CT CERVICAL SPINE FINDINGS Alignment: Nonspecific reversal of the expected cervical lordosis. 2 mm grade 1 anterolisthesis at C2-C3, C3-C4 and C4-C5. Slight grade 1 retrolisthesis at  C7-T1 and T1-T2. Skull base and vertebrae: The basion-dental and atlanto-dental intervals are maintained.No evidence of acute fracture to the cervical spine. Soft tissues and spinal canal: No prevertebral fluid or swelling. No visible canal hematoma. Disc levels: Cervical spondylosis with multilevel disc space narrowing, disc bulges/central disc protrusions, posterior disc osteophyte complexes, uncovertebral hypertrophy and facet arthropathy. Disc space narrowing is advanced at C5-C6 and C6-C7 (with possible bridging osseous fusion across the disc spaces at these levels). Multilevel spinal canal narrowing. Most notably at C5-C6, a posterior disc osteophyte complex results in mild-to-moderate spinal canal narrowing. Multilevel bony neural foraminal narrowing. Multilevel ventral osteophytes. Degenerative changes also present at the C1-C2 articulation. Upper chest: Separately reported on same day CT chest/abdomen/pelvis. Other: Known thyroid  nodules, better delineated on the prior thyroid  ultrasound of 05/05/2013. By report, a dominant right thyroid  lobe nodule was previously biopsied. Correlate with pathology from this prior procedure. No evidence of an acute intracranial abnormality. No evidence of an acute cervical spine fracture. These results discussed in person at the time of interpretation on 10/17/2023 at 9:40 am with provider Dr. Aniceto Barley, who verbally acknowledged these results. IMPRESSION: CT head: 1. No evidence of an acute intracranial abnormality. 2. Focus of chronic encephalomalacia/gliosis within the anterior left temporal lobe. 3. Chronic lacunar  infarct within the left subinsular white matter. 4. Background parenchymal atrophy and chronic small vessel disease. CT cervical spine: 1. No evidence of an acute cervical spine fracture. 2. Nonspecific reversal of the expected cervical lordosis. 3. Mild grade 1 anterolisthesis at C2-C3, C3-C4, C4-C5, C7-T1 and T1-T2. 4. Cervical spondylosis as described within the body of the report. 5. Possible vertebral body ankylosis at C5-C6 and C6-C7. Electronically Signed   By: Bascom Lily D.O.   On: 10/17/2023 09:46   CT Cervical Spine Wo Contrast Result Date: 10/17/2023 CLINICAL DATA:  Provided history: Polytrauma, blunt. Additional history provided: Fall. Hypotensive. EXAM: CT HEAD WITHOUT CONTRAST CT CERVICAL SPINE WITHOUT CONTRAST TECHNIQUE: Multidetector CT imaging of the head and cervical spine was performed following the standard protocol without intravenous contrast. Multiplanar CT image reconstructions of the cervical spine were also generated. RADIATION DOSE REDUCTION: This exam was performed according to the departmental dose-optimization program which includes automated exposure control, adjustment of the mA and/or kV according to patient size and/or use of iterative reconstruction technique. COMPARISON:  CT head 08/31/2023. Thyroid  ultrasound 11/12/2012. Thyroid  ultrasound 05/05/2013. FINDINGS: CT HEAD FINDINGS Brain: Generalized cerebral atrophy. Focus of chronic encephalomalacia/gliosis again demonstrated within the anterior left temporal lobe. Chronic lacunar infarct again demonstrated within the left subinsular white matter. Mild-to-moderate patchy and ill-defined hypoattenuation elsewhere within the cerebral white matter, nonspecific but compatible chronic small vessel disease. There is no acute intracranial hemorrhage. No acute demarcated cortical infarct. No extra-axial fluid collection. No evidence of an intracranial mass. No midline shift. Vascular: No hyperdense vessel.  Atherosclerotic  calcifications. Skull: No calvarial fracture or aggressive osseous lesion. Sinuses/Orbits: No mass or acute finding within the imaged orbits. No significant paranasal sinus disease at the imaged levels. CT CERVICAL SPINE FINDINGS Alignment: Nonspecific reversal of the expected cervical lordosis. 2 mm grade 1 anterolisthesis at C2-C3, C3-C4 and C4-C5. Slight grade 1 retrolisthesis at C7-T1 and T1-T2. Skull base and vertebrae: The basion-dental and atlanto-dental intervals are maintained.No evidence of acute fracture to the cervical spine. Soft tissues and spinal canal: No prevertebral fluid or swelling. No visible canal hematoma. Disc levels: Cervical spondylosis with multilevel disc space narrowing, disc bulges/central disc protrusions, posterior disc osteophyte complexes, uncovertebral hypertrophy and facet  arthropathy. Disc space narrowing is advanced at C5-C6 and C6-C7 (with possible bridging osseous fusion across the disc spaces at these levels). Multilevel spinal canal narrowing. Most notably at C5-C6, a posterior disc osteophyte complex results in mild-to-moderate spinal canal narrowing. Multilevel bony neural foraminal narrowing. Multilevel ventral osteophytes. Degenerative changes also present at the C1-C2 articulation. Upper chest: Separately reported on same day CT chest/abdomen/pelvis. Other: Known thyroid  nodules, better delineated on the prior thyroid  ultrasound of 05/05/2013. By report, a dominant right thyroid  lobe nodule was previously biopsied. Correlate with pathology from this prior procedure. No evidence of an acute intracranial abnormality. No evidence of an acute cervical spine fracture. These results discussed in person at the time of interpretation on 10/17/2023 at 9:40 am with provider Dr. Aniceto Barley, who verbally acknowledged these results. IMPRESSION: CT head: 1. No evidence of an acute intracranial abnormality. 2. Focus of chronic encephalomalacia/gliosis within the anterior left temporal  lobe. 3. 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 4. Background parenchymal atrophy and chronic small vessel disease. CT cervical spine: 1. No evidence of an acute cervical spine fracture. 2. Nonspecific reversal of the expected cervical lordosis. 3. Mild grade 1 anterolisthesis at C2-C3, C3-C4, C4-C5, C7-T1 and T1-T2. 4. Cervical spondylosis as described within the body of the report. 5. Possible vertebral body ankylosis at C5-C6 and C6-C7. Electronically Signed   By: Bascom Lily D.O.   On: 10/17/2023 09:46   CT CHEST ABDOMEN PELVIS W CONTRAST Result Date: 10/17/2023 CLINICAL DATA:  Found down, hypotension, confusion EXAM: CT CHEST, ABDOMEN, AND PELVIS WITH CONTRAST TECHNIQUE: Multidetector CT imaging of the chest, abdomen and pelvis was performed following the standard protocol during bolus administration of intravenous contrast. RADIATION DOSE REDUCTION: This exam was performed according to the departmental dose-optimization program which includes automated exposure control, adjustment of the mA and/or kV according to patient size and/or use of iterative reconstruction technique. CONTRAST:  80mL OMNIPAQUE  IOHEXOL  350 MG/ML SOLN COMPARISON:  11/03/2014 FINDINGS: CT CHEST FINDINGS Cardiovascular: The heart is unremarkable without pericardial effusion. Dual lead cardiac pacer is identified. No evidence of thoracic aortic aneurysm or dissection. Postsurgical changes from prior CABG. Atherosclerosis of the aorta and native coronary vessels. No evidence of mediastinal injury. Mediastinum/Nodes: No enlarged mediastinal, hilar, or axillary lymph nodes. Thyroid  gland, trachea, and esophagus demonstrate no significant findings. Lungs/Pleura: Scattered areas of parenchymal lung scarring are seen at the lung apices and left lung base. No acute airspace disease, effusion, or pneumothorax. Central airways are patent. Musculoskeletal: There are no acute displaced fractures. Reconstructed images  demonstrate no additional findings. CT ABDOMEN PELVIS FINDINGS Hepatobiliary: No hepatic injury or perihepatic hematoma. Gallbladder is unremarkable. Pancreas: Unremarkable. No pancreatic ductal dilatation or surrounding inflammatory changes. Spleen: No splenic injury or perisplenic hematoma. Adrenals/Urinary Tract: No evidence of acute renal injury. Kidneys enhance normally and symmetrically. No urinary tract calculi or obstructive uropathy. Right adrenal is unremarkable. Thickening of the body of the left adrenal gland measuring 1 cm, with an attenuation of 40 HU, nonspecific but not significantly changed since prior study and likely reflecting adenoma. The bladder is moderately distended, without focal abnormality. No filling defects. Stomach/Bowel: No bowel obstruction or ileus. Moderate retained stool throughout the colon, most pronounced in the mid transverse colon at the site of prior partial colon resection and reanastomosis. No bowel wall thickening or inflammatory change. Vascular/Lymphatic: Aortic atherosclerosis. No enlarged abdominal or pelvic lymph nodes. Reproductive: Status post hysterectomy. No adnexal masses. Other: No free intraperitoneal fluid or free intraperitoneal gas. No  abdominal wall hernia. Musculoskeletal: There is a comminuted fracture through the left side pubic symphysis, extending to the left superior pubic ramus. Mild displacement of the fracture fragments, with near anatomic alignment. Soft tissue edema is seen along the left pelvic sidewall adjacent to the fracture site. No contrast extravasation to suggest active hemorrhage. There is also a nondisplaced fracture through the right sacral ala, with mild adjacent soft tissue edema. No contrast extravasation to suggest active hemorrhage. No other acute bony abnormalities. Reconstructed images demonstrate no additional findings. IMPRESSION: 1. Comminuted and minimally displaced left parasymphyseal fracture extending into the left  superior pubic ramus, with soft tissue edema along the left pelvic sidewall. No contrast extravasation to suggest active hemorrhage. No evidence of acute bladder injury. 2. Nondisplaced fracture through the right sacral ala, with minimal adjacent soft tissue edema. No contrast extravasation to suggest active hemorrhage. 3. Otherwise no acute intrathoracic or intra-abdominal trauma. 4.  Aortic Atherosclerosis (ICD10-I70.0). Critical Value/emergent results were called by telephone at the time of interpretation on 10/17/2023 at 9:34 am to provider DR Aniceto Barley, who verbally acknowledged these results. Electronically Signed   By: Bobbye Burrow M.D.   On: 10/17/2023 09:40    Labs:  Basic Metabolic Panel: Recent Labs  Lab 11/01/23 0603 11/03/23 0559 11/05/23 0548  NA 135 134* 133*  K 3.8 4.0 4.4  CL 102 99 100  CO2 26 26 27   GLUCOSE 105* 114* 109*  BUN 12 11 12   CREATININE 0.65 0.66 0.61  CALCIUM  8.7* 8.7* 8.6*    CBC: Recent Labs  Lab 11/01/23 0603 11/03/23 0559 11/05/23 0548  WBC 6.3 4.8 5.0  NEUTROABS  --  3.6  --   HGB 9.5* 9.8* 10.6*  HCT 29.4* 30.3* 32.8*  MCV 93.6 92.4 93.2  PLT 577* 545* 514*    CBG: No results for input(s): "GLUCAP" in the last 168 hours.  Brief HPI:   Rachel Clark is a 74 y.o. right-handed female with history significant for dementia maintained on [place] , hypothyroidism, complete heart block status post permanent pacemaker as well as history of CABG with stents followed by cardiology service with Dr. [place] maintained on low-dose aspirin , hypertension, history of GI bleed/gastric ulcer, epilepsy maintained on Tegretol  chronic anemia followed by hematology services as well as colon cancer.  Per chart review patient lives with husband and son.  Independent mobility but needed assistance due to dementia and safety awareness.  She was rather sedentary prior to admission.  Presented 10/17/2023 after a fall.  No report of loss of consciousness.  Admission  chemistries unremarkable except hemoglobin 8.9 WBC 13,500.  Cranial CT scan negative.  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  CT cervical spine negative.  X-rays and CT imaging of the pelvis showed comminuted minimally displaced fracture of the left pubic body with extension along the left superior pubic ramus.  Nondisplaced fracture of the right sacral ala.  Follow-up orthopedic services Dr. Guyann Leitz and nonoperative weightbearing as tolerated.  Hospital course in regards to patient's acute on chronic anemia she was receiving iron  infusions as an outpatient was administered 10/22/2023 while in the hospital.  Hemoglobin ranged from 7.5-8.7.  She was cleared to continue low-dose aspirin  therapy.  Lovenox  added for DVT prophylaxis 14 2025.  She did have some hyponatremia 130-134 placed on sodium chloride  tablets 10/21/2023.  Patient's Effient  as well as Repatha  prior to admission remained on hold resume as needed.  Therapy evaluations completed due to patient decreased functional mobility was admitted for  a comprehensive rehab program.   Hospital Course: Rachel Clark was admitted to rehab 10/22/2023 for inpatient therapies to consist of PT, ST and OT at least three hours five days a week. Past admission physiatrist, therapy team and rehab RN have worked together to provide customized collaborative inpatient rehab.  Pertaining to patient's left sacral ala and superior pubic rami fracture.  Weightbearing as tolerated follow-up orthopedic services.  Neurovascular sensation intact.  Lovenox  for DVT prophylaxis.  Vascular studies negative.  She continued on low-dose aspirin .  Pain managed with use of Lidoderm  patch as well as tramadol  as needed.  Mood stabilization with history of dementia she continued on Namenda  as well as Cymbalta .  Acute on chronic anemia history of GI bleed colon cancer receiving outpatient iron  infusions maintain on iron  supplement follow-up hematology services.  She did  have a history of epilepsy continued on Tegretol  as well as Zarontin  500 mg twice daily.  CAD with history of CABG stents and permanent pacemaker followed by cardiology services blood pressure remained controlled no chest pain or shortness of breath she continued on Imdur  as advised as well as monitoring of blood pressure while on Norvasc  5 mg daily.  She did have occasional urinary incontinence urinalysis study positive nitrite placed empirically on Keflex  however with elevated PVR and in ability to void a foley tube was placed and follow up outpatient Urology.  She denied any dysuria or hematuria.   Blood pressures were monitored on TID basis and remained controlled and monitored     Rehab course: During patient's stay in rehab weekly team conferences were held to monitor patient's progress, set goals and discuss barriers to discharge. At admission, patient required min max assist sit to side-lying moderate assist sit to stand  Physical exam.  Blood pressure 157/71 pulse 95 temperature 98.1 respiration 17 oxygen saturations 99% room air Constitutional.  No acute distress HEENT Head.  Normocephalic and atraumatic Eyes.  Pupils round and reactive to light no discharge without nystagmus Neck.  Supple nontender no JVD without thyromegaly Cardiac regular rate and rhythm without any extra sounds or murmur heard Abdomen.  Soft nontender positive bowel sounds without rebound Respiratory effort normal no respiratory distress without wheeze Skin.  Warm and dry Neurologic.  Patient was awake and alert follow basic commands.  Answers biographical questions sometimes very matter-of-factly.  Fair insight and awareness, short-term memory although husband interjects and frequently during conversation.  Cranial nerve exam nonfocal.  Manual muscle testing 4+ to 5/5 bilateral upper extremities.  Right lower extremity 3/5 hip flexors knee extension and 4/5 ankle dorsi plantarflexion.  Left lower extremity 2 -/5  hip flexors knee extension with pain component 4/5 APF ADF.  Sensation intact  He/She  has had improvement in activity tolerance, balance, postural control as well as ability to compensate for deficits. He/She has had improvement in functional use RUE/LUE  and RLE/LLE as well as improvement in awareness.  Sessions focused on therapeutic activities.  Completes transfers with minimal assist to rolling walker.  Ambulated 20 feet with cues for gait mechanics.  Patient turns to sit on bedside commode over toilet with minimal assist.  Able to maintain standing with moderate assist for managing lower body dressing.  Completes stand step transfer rolling walker to recliner with min mod assist.  During ADL she moves to the edge of bed with supervision.  Minimal assist to stand using rolling walker to minimal assist stand pivot rolling walker.  Contact-guard sit to stand using grab bar minimal  assist stand pivot in the bathroom.  Bedside commode over toilet.  Seated at sink required cues for initiation but able to brush her teeth wash her face doff and don her shirt and apply deodorant with supervision.  Family did not feel they could provide necessary physical assistance on discharge her recommendations were for skilled nursing facility.       Disposition:  Discharge disposition: 03-Skilled Nursing Facility        Diet: Regular  Special Instructions: No driving smoking or alcohol  Weightbearing as tolerated  Medications at discharge. 1.  Tylenol  as needed 2.  Norvasc  10 mg p.o. daily 3.  Aspirin  81 mg p.o. daily 4.  Calcium  carbonate 500 mg 1 tablet daily 5.  Tegretol -XR 600 mg p.o. twice daily 6.  Vitamin D  4000 units p.o. daily 7.  Vitamin B12 1000 mcg p.o. daily 8.  Colace 100 mg p.o. twice daily 9.Zarontin  500 mg p.o. twice daily 10.  Ferrous sulfate  325 mg p.o. daily 11.  Flonase  50 mcg 1 spray each nare daily 12.  Imdur  15 mg p.o. daily 13.  Synthroid  50 mcg p.o. daily 14.  Lidoderm   patch changes directed 15.  Tramadol  50-100mg  every 6 hours as needed 16.  Protonix  40 mg p.o. twice daily 17.  MiraLAX  daily hold for loose stools 18.  Cymbalta  60 mg twice daily 19.  Repatha  140 mg every 14 days 20.  Neurontin  100 mg 3 times daily 21.  Namenda  10 mg twice daily 22.  Nitroglycerin  as needed 23.  Effient  10 mg daily 24.  Nexlizet  180-10 mg daily 25.  Duricef 500 mg twice daily x 1 day and stop 26.  Voltaren  gel 4 g 4 times daily to affected area 27.  Robaxin  500 mg every 8 hours as needed muscle spasms 28.  Flomax  0.8 mg nightly 29.  Nitroglycerin  as needed 30.  Vitamin B12 500 mcg daily 31.  Sodium chloride  tablet 1 g twice daily 30-35 minutes were spent completing discharge summary and discharge planning  Discharge Instructions     Ambulatory referral to Urology   Complete by: As directed    Eval and treat for urinary retention and failed voiding trial with foley tube placed        Follow-up Information     Bea Lime, DO Follow up.   Specialty: Physical Medicine and Rehabilitation Why: No formal follow-up needed Contact information: [address] [place] [address] [phone]         Almeda Jacobs, MD Follow up.   Specialty: Hematology and Oncology Why: Call for appointment Contact information: [address] [place] [address] [phone]         Hardy Lia, MD Follow up.   Specialty: Orthopedic Surgery Why: Call for appointment Contact information: [address] [place] [address] [phone]         [place], Peter M, MD Follow up.   Specialty: Cardiology Why: Call for appointment Contact information: [address] [place] [address] [phone]                 Signed: Everlyn Hockey Ahtziri Jeffries 11/06/2023, 11:22 AM

## 2023-10-22 NOTE — Progress Notes (Signed)
 [facility] Progress Note     Subjective: CC:  No complaints. Resting comfortably. No family at bedside. Noted R IV was removed overnight due to purulence.   Objective: Vital signs in last 24 hours: Temp:  [97.8 F (36.6 C)-99.5 F (37.5 C)] 98.1 F (36.7 C) (04/21 0340) Pulse Rate:  [87-104] 95 (04/21 0800) Resp:  [17-18] 17 (04/21 0800) BP: (140-157)/(65-71) 157/71 (04/21 0800) SpO2:  [98 %-99 %] 99 % (04/21 0800) Last BM Date : 10/21/22  Intake/Output from previous day: 04/20 0701 - 04/21 0700 In: 240 [P.O.:240] Out: 1000 [Urine:1000] Intake/Output this shift: No intake/output data recorded.  PE: Gen:  Alert, NAD, pleasant Card:  Regular rate and rhythm, no lower extremity edema  Pulm:  Normal effort ORA Abd: Soft, non-tender, non-distended Skin: warm and dry, no rashes  Psych: A&Ox1  Lab Results:  Recent Labs    10/20/23 1146 10/21/23 1543  WBC 11.8* 8.9  HGB 7.8* 7.5*  HCT 23.7* 23.5*  PLT 279 286   BMET Recent Labs    10/20/23 1146  NA 130*  K 4.0  CL 98  CO2 25  GLUCOSE 133*  BUN 12  CREATININE 0.67  CALCIUM  8.2*   PT/INR No results for input(s): "LABPROT", "INR" in the last 72 hours. CMP     Component Value Date/Time   NA 130 (L) 10/20/2023 1146   K 4.0 10/20/2023 1146   CL 98 10/20/2023 1146   CO2 25 10/20/2023 1146   GLUCOSE 133 (H) 10/20/2023 1146   BUN 12 10/20/2023 1146   CREATININE 0.67 10/20/2023 1146   CALCIUM  8.2 (L) 10/20/2023 1146   PROT 5.8 (L) 10/17/2023 0930   ALBUMIN 3.0 (L) 10/17/2023 0930   AST 19 10/17/2023 0930   ALT 12 10/17/2023 0930   ALKPHOS 47 10/17/2023 0930   BILITOT 0.4 10/17/2023 0930   GFRNONAA >60 10/20/2023 1146   Lipase  No results found for: "LIPASE"     Studies/Results: No results found.  Anti-infectives: Anti-infectives (From admission, onward)    None        Assessment/Plan  GLF L sacral ala and L superior pubic rami fx - WBAT per Dr. Guyann Leitz   Acute on chronic  anemia - has iron  infusion outpatient scheduled for today, give IV iron  this afternoon. Re-check CBC In AM. Hgb overall stable 7.5 from 7.8. Hypothyroidism Epilepsy Dementia  Hx of complete heart block s/p PPM Hx of gastric ulcer Fibromyalgia  Hx of colon cancer s/p resection in 2013 CAD s/p CABG and stents  - holding anticoagulation, husband also reports hx GIB while on this medication.   - lipid panel WNL 4/19. Hold repatha  for now    FEN: reg diet, SLIV, bowel regimen; hyponatremia (130 yesterday), salt tabs TID, BMP pending VTE: SCDs, possibly resume effient  if hgb remains stable ID: no current abx   Dispo: 4NP, pain control and therapies. Anticipating CIR next 1-2  days, medically stable for CIR Follow up BMP, give IV iron    LOS: 5 days   I reviewed nursing notes, last 24 h vitals and pain scores, last 48 h intake and output, last 24 h labs and trends, and last 24 h imaging results.  This care required moderate level of medical decision making.   Michial Akin, PA-C [facility] Please see Amion for pager number during day hours 7:00am-4:30pm

## 2023-10-22 NOTE — Progress Notes (Signed)
 PMR Admission Coordinator Pre-Admission Assessment   Patient: Rachel Clark is an 73 y.o., female MRN: [id number] DOB: [birth year] Height: 5\' 8"  (172.7 cm) Weight: 80.1 kg   Insurance Information HMO:     PPO:      PCP:      IPA:      80/20:      OTHER:  PRIMARY: Medicare Part A and B      Policy#: [id number]      Subscriber: pt Benefits:  Phone #: passport one source online     Name: 4/18 Eff. Date: 05/03/2014     Deduct: $1676      Out of Pocket Max: none      Life Max: none CIR: 100%      SNF: 20 full days Outpatient: 80%     Co-Pay: 20% Home Health: 100%      Co-Pay: none DME: 80%     Co-Pay: 20% Providers: pt choice  SECONDARY: [employer]      Policy#: [id number]     Phone#:    Financial Counselor:       Phone#:    The "Data Collection Information Summary" for patients in Inpatient Rehabilitation Facilities with attached "Privacy Act Statement-Health Care Records" was provided and verbally reviewed with: Patient and Family   Emergency Contact Information Contact Information       Name Relation Home Work Mobile    Deblasi,James Spouse [phone]             Other Contacts   None on File        Current Medical History  Patient Admitting Diagnosis: right sacral ala and left pubic rami fractures   History of Present Illness: SOFFIA DOSHIER is a 73 year old right-handed female with history of dementia maintained on Namenda , hypothyroidism, complete heart block status post permanent pacemaker as well as history of CABG with stents followed by cardiology service Dr. [place] maintained on low-dose aspirin , hypertension, history of GI bleed/gastric ulcer, epilepsy maintained on Tegretol , chronic anemia followed by hematology services Dr. Marton Sleeper as well as colon cancer.  Per chart review lives with husband and son.  Independent mobility but needed assistance due to dementia and safety awareness.  She was rather sedentary prior to admission.  Presented 10/17/2023 after  fall.  No report of loss of consciousness.  Admission chemistries unremarkable except glucose 119, hemoglobin 8.9, WBC 13,500.  Cranial CT scan negative for acute changes.  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  CT cervical spine negative.  X-rays and CT imaging of the pelvis showed comminuted minimally displaced fracture of the left pubic body with extension along the left superior pubic ramus.  Nondisplaced fracture of the right sacral ala.  Follow-up orthopedic service Dr. Kevan Peers nonoperative weightbearing as tolerated.  Hospital course in regards to patient's acute on chronic anemia she was receiving iron  infusions as an outpatient which was administered 10/22/2023 while in the hospital.  Hemoglobin ranges from 7.5-8.7.  She was cleared to continue her low-dose aspirin  therapy.  Lovenox  added for DVT prophylaxis 10/19/2023.  He did have some hyponatremia 130-134 and was placed on sodium chloride  tablets 1 g 3 times daily 10/21/2023.  Patient on Effient  as well as Repatha  prior to admission remains on hold.  Therapy evaluations completed due to patient's decreased functional mobility was admitted for a comprehensive rehab program.    Patient's medical record from [facility] has been reviewed by the rehabilitation admission coordinator and physician.  Past Medical History  No past medical history on file.         Has the patient had major surgery during 100 days prior to admission? No   Family History   family history is not on file.   Current Medications   Current Medications    Current Facility-Administered Medications:    acetaminophen  (TYLENOL ) tablet 1,000 mg, 1,000 mg, Oral, Q6H, Lovick, Bard Boor, MD, 1,000 mg at 10/19/23 1130   amLODipine  (NORVASC ) tablet 5 mg, 5 mg, Oral, Daily, Johnson, Kelly R, PA-C, 5 mg at 10/18/23 1142   aspirin  EC tablet 81 mg, 81 mg, Oral, Daily, Annetta Killian, PA-C, 81 mg at 10/19/23 0900   calcium  carbonate (TUMS - dosed in  mg elemental calcium ) chewable tablet 200 mg of elemental calcium , 1 tablet, Oral, Daily, Johnson, Kelly R, PA-C, 200 mg of elemental calcium  at 10/19/23 0900   carbamazepine  (TEGRETOL  XR) 12 hr tablet 600 mg, 600 mg, Oral, BID, Johnson, Kelly R, PA-C, 600 mg at 10/19/23 0900   cholecalciferol  (VITAMIN D3) 25 MCG (1000 UNIT) tablet 4,000 Units, 4,000 Units, Oral, Daily, Annetta Killian, PA-C, 4,000 Units at 10/19/23 0900   cyanocobalamin  (VITAMIN B12) tablet 1,000 mcg, 1,000 mcg, Oral, Daily, Johnson, Kelly R, PA-C, 1,000 mcg at 10/19/23 0900   docusate sodium  (COLACE) capsule 100 mg, 100 mg, Oral, BID, Lovick, Bard Boor, MD, 100 mg at 10/19/23 0900   DULoxetine  (CYMBALTA ) DR capsule 60 mg, 60 mg, Oral, BID, Johnson, Kelly R, PA-C, 60 mg at 10/19/23 0900   enoxaparin  (LOVENOX ) injection 30 mg, 30 mg, Subcutaneous, Q12H, Lovick, Bard Boor, MD, 30 mg at 10/19/23 0900   ethosuximide  (ZARONTIN ) capsule 500 mg, 500 mg, Oral, BID, Johnson, Kelly R, PA-C, 500 mg at 10/19/23 0900   ferrous sulfate  tablet 325 mg, 325 mg, Oral, Q breakfast, Johnson, Kelly R, PA-C, 325 mg at 10/19/23 0825   fluticasone  (FLONASE ) 50 MCG/ACT nasal spray 1 spray, 1 spray, Each Nare, Daily, Annetta Killian, PA-C, 1 spray at 10/19/23 0900   haloperidol  lactate (HALDOL ) injection 2.5 mg, 2.5 mg, Intravenous, Q8H PRN, Lujean Sake, MD, 2.5 mg at 10/18/23 2204   hydrALAZINE  (APRESOLINE ) injection 10 mg, 10 mg, Intravenous, Q2H PRN, Anda Bamberg, MD   isosorbide  mononitrate (IMDUR ) 24 hr tablet 15 mg, 15 mg, Oral, Daily, Johnson, Kelly R, PA-C, 15 mg at 10/19/23 0900   levothyroxine  (SYNTHROID ) tablet 50 mcg, 50 mcg, Oral, Daily, Johnson, Kelly R, PA-C, 50 mcg at 10/19/23 0558   lidocaine  (LIDODERM ) 5 % 2 patch, 2 patch, Transdermal, Q24H, Annetta Killian, PA-C, 2 patch at 10/19/23 1130   memantine  (NAMENDA ) tablet 10 mg, 10 mg, Oral, BID, Johnson, Kelly R, PA-C, 10 mg at 10/19/23 0900   methocarbamol  (ROBAXIN ) tablet 1,000 mg,  1,000 mg, Oral, Q8H, 1,000 mg at 10/19/23 1130 **OR** [DISCONTINUED] methocarbamol  (ROBAXIN ) injection 500 mg, 500 mg, Intravenous, Q8H, Lovick, Bard Boor, MD   metoprolol  tartrate (LOPRESSOR ) injection 5 mg, 5 mg, Intravenous, Q6H PRN, Lovick, Bard Boor, MD   morphine  (PF) 2 MG/ML injection 2 mg, 2 mg, Intravenous, Q4H PRN, [place] Breath R, PA-C   ondansetron  (ZOFRAN -ODT) disintegrating tablet 4 mg, 4 mg, Oral, Q6H PRN **OR** ondansetron  (ZOFRAN ) injection 4 mg, 4 mg, Intravenous, Q6H PRN, Lovick, Bard Boor, MD   oxyCODONE  (Oxy IR/ROXICODONE ) immediate release tablet 2.5-5 mg, 2.5-5 mg, Oral, Q4H PRN, Anda Bamberg, MD, 5 mg at 10/18/23 2034   pantoprazole  (PROTONIX ) EC tablet 40 mg, 40 mg,  Oral, BID, Johnson, Kelly R, PA-C, 40 mg at 10/19/23 0900   polyethylene glycol (MIRALAX  / GLYCOLAX ) packet 17 g, 17 g, Oral, BID, Johnson, Kelly R, PA-C, 17 g at 10/19/23 0900      Patients Current Diet:  Diet Order                        Diet regular Room service appropriate? Yes; Fluid consistency: Thin  Diet effective now                         Precautions / Restrictions Precautions Precautions: Fall, ICD/Pacemaker, Other (comment) (orthostatic) Precaution/Restrictions Comments: watch BP; premedicate Restrictions Weight Bearing Restrictions Per Provider Order: No    Has the patient had 2 or more falls or a fall with injury in the past year? Yes   Prior Activity Level Community (5-7x/wk): independent without AD   Prior Functional Level Self Care: Did the patient need help bathing, dressing, using the toilet or eating? Independent   Indoor Mobility: Did the patient need assistance with walking from room to room (with or without device)? Independent   Stairs: Did the patient need assistance with internal or external stairs (with or without device)? Independent   Functional Cognition: Did the patient need help planning regular tasks such as shopping or remembering to take medications?  Independent   Patient Information Are you of Hispanic, Latino/a,or Spanish origin?: A. No, not of Hispanic, Latino/a, or Spanish origin What is your race?: A. White ([place]) Do you need or want an interpreter to communicate with a doctor or health care staff?: 0. No   Patient's Response To:  Health Literacy and Transportation Is the patient able to respond to health literacy and transportation needs?: Yes Health Literacy - How often do you need to have someone help you when you read instructions, pamphlets, or other written material from your doctor or pharmacy?: Never In the past 12 months, has lack of transportation kept you from medical appointments or from getting medications?: No In the past 12 months, has lack of transportation kept you from meetings, work, or from getting things needed for daily living?: No   Journalist, newspaper / Equipment Home Equipment: Information systems manager, BSC/3in1   Prior Device Use: Indicate devices/aids used by the patient prior to current illness, exacerbation or injury? None of the above   Current Functional Level Cognition   Orientation Level: Oriented to person, Oriented to place, Disoriented to situation, Disoriented to time    Extremity Assessment (includes Sensation/Coordination)   Upper Extremity Assessment: Defer to OT evaluation  Lower Extremity Assessment: RLE deficits/detail, LLE deficits/detail RLE Deficits / Details: able to fully extend knee against gravity but it causes hip and knee pain LLE Deficits / Details: able to extend knee against gravity     ADLs   Overall ADL's : Needs assistance/impaired Eating/Feeding: Modified independent Grooming: Set up, Sitting Upper Body Bathing: Set up, Sitting Lower Body Bathing: Maximal assistance, Bed level Upper Body Dressing : Minimal assistance, Sitting Lower Body Dressing: Maximal assistance Toilet Transfer: +2 for safety/equipment, Minimal assistance Toilet Transfer Details (indicate cue type  and reason): ambulated with nsg staff prior to session; near syncope event in bathroom Functional mobility during ADLs: Maximal assistance     Mobility   Overal bed mobility: Needs Assistance Bed Mobility: Supine to Sit, Sit to Sidelying, Rolling Rolling: Max assist, +2 for physical assistance Supine to sit: Mod assist, HOB elevated, Used rails  Sit to supine: Max assist Sit to sidelying: Min assist, Max assist General bed mobility comments: supine to sit: required mod A for proper sequencing, hand placement, attention to task, and squaring hips at EOB. Sit to sidelying and rolling: pt required min A for proper sequencing to rest on L elbow before max A x2 with helicopter manuever to supine and max A for LE elevation.     Transfers   Overall transfer level: Needs assistance Equipment used: Rolling walker (2 wheels) Transfers: Sit to/from Stand Sit to Stand: Mod assist, +2 physical assistance, From elevated surface General transfer comment: pt completed 4 sit to stands, 1st from lowest bed height and 3 from an elevated height. For all transfers, pt reported increased hip and buttocks pain that limited her ability to fully extend her knees and trunk and she would sit back down. Pt was unable  to march in place s/t hip/buttocks pain     Ambulation / Gait / Stairs / Wheelchair Mobility   Ambulation/Gait General Gait Details: deferred s/t pt presentation     Posture / Balance Dynamic Sitting Balance Sitting balance - Comments: pt sits EOB without LOB Balance Overall balance assessment: Needs assistance, History of Falls Sitting-balance support: No upper extremity supported, Feet supported, Bilateral upper extremity supported Sitting balance-Leahy Scale: Fair Sitting balance - Comments: pt sits EOB without LOB Standing balance support: Reliant on assistive device for balance, During functional activity, Bilateral upper extremity supported Standing balance-Leahy Scale: Poor Standing balance  comment: pt reliant on RW and therapist assistance for standing balance. Stands with knees still flexed. No LOB     Special needs/care consideration Spouse manages medication pill box Family reports worsening of her memory Seizure precautions    Previous Home Environment  Living Arrangements:  (lives with spouse, youngest son to return to his own home; son recent brain surgery/seizures and needed Dad to provide supervision postop ( son returning to his own home))  Lives With: Spouse Available Help at Discharge: Family, Available 24 hours/day Type of Home: House Home Layout: One level Home Access: Stairs to enter Entrance Stairs-Rails: Left Entrance Stairs-Number of Steps: 2 Bathroom Shower/Tub: Health visitor: Handicapped height Bathroom Accessibility: Yes How Accessible: Accessible via wheelchair Home Care Services: No Additional Comments: dtr out of town, son in [place], Spouse is her caregiver   Discharge Living Setting Plans for Discharge Living Setting: Patient's home, Lives with (comment) (Spouse) Type of Home at Discharge: House Discharge Home Layout: One level Discharge Home Access: Stairs to enter Entrance Stairs-Rails: Left Entrance Stairs-Number of Steps: 2 Discharge Bathroom Shower/Tub: Walk-in shower Discharge Bathroom Toilet: Handicapped height Discharge Bathroom Accessibility: Yes How Accessible: Accessible via walker Does the patient have any problems obtaining your medications?: No   Social/Family/Support Systems Patient Roles: Spouse, Parent Contact Information: spouse, Royston Cornea Anticipated Caregiver: spouse Anticipated Industrial/product designer Information: see contacts Ability/Limitations of Caregiver: spouse has back issues, can not "lift" her Caregiver Availability: 24/7 Discharge Plan Discussed with Primary Caregiver: Yes Is Caregiver In Agreement with Plan?: Yes Does Caregiver/Family have Issues with Lodging/Transportation while Pt is in  Rehab?: No   Goals Patient/Family Goal for Rehab: supervision PT, superivsion to min OT, supervision SLP Expected length of stay: ELOS 10 to 14 days Additional Information: SPouse manages her medication pill box system Pt/Family Agrees to Admission and willing to participate: Yes Program Orientation Provided & Reviewed with Pt/Caregiver Including Roles  & Responsibilities: Yes   Decrease burden of Care through IP rehab admission: n/a  Possible need for SNF placement upon discharge: not anticipated   Patient Condition: I have reviewed medical records from [facility], spoken with  patient, spouse, and son. I met with patient at the bedside for inpatient rehabilitation assessment.  Patient will benefit from ongoing PT and OT, can actively participate in 3 hours of therapy a day 5 days of the week, and can make measurable gains during the admission.  Patient will also benefit from the coordinated team approach during an Inpatient Acute Rehabilitation admission.  The patient will receive intensive therapy as well as Rehabilitation physician, nursing, social worker, and care management interventions.  Due to bladder management, bowel management, safety, skin/wound care, disease management, medication administration, pain management, and patient education the patient requires 24 hour a day rehabilitation nursing.  The patient is currently mod A with mobility and basic ADLs.  Discharge setting and therapy post discharge at home with home health is anticipated.  Patient has agreed to participate in the Acute Inpatient Rehabilitation Program and will admit today.   Preadmission Screen Completed By:  Tanya Fantasia RN MSN 10/19/2023 2:40 PM ______________________________________________________________________   Discussed status with Dr. Rachel Budds  on 10/22/23 at 900 and received approval for admission today.   Admission Coordinator:  Tanya Fantasia, RN MSN time 1055/Date 10/22/23     Assessment/Plan: Diagnosis: right sacral ala and left pubic rami fx's Does the need for close, 24 hr/day Medical supervision in concert with the patient's rehab needs make it unreasonable for this patient to be served in a less intensive setting? Yes Co-Morbidities requiring supervision/potential complications: dementia, CHB, CAD, GIB Due to bladder management, bowel management, safety, skin/wound care, disease management, medication administration, pain management, and patient education, does the patient require 24 hr/day rehab nursing? Yes Does the patient require coordinated care of a physician, rehab nurse, PT, OT, and SLP to address physical and functional deficits in the context of the above medical diagnosis(es)? Yes Addressing deficits in the following areas: balance, endurance, locomotion, strength, transferring, bowel/bladder control, bathing, dressing, feeding, grooming, toileting, cognition, and psychosocial support Can the patient actively participate in an intensive therapy program of at least 3 hrs of therapy 5 days a week? Yes The potential for patient to make measurable gains while on inpatient rehab is excellent Anticipated functional outcomes upon discharge from inpatient rehab: supervision PT, supervision and min assist OT, supervision SLP Estimated rehab length of stay to reach the above functional goals is: 10-14 days Anticipated discharge destination: Home 10. Overall Rehab/Functional Prognosis: excellent     MD Signature: Rawland Caddy, MD, [facility] Physical Medicine & Rehabilitation Medical Director Rehabilitation Services 10/22/2023

## 2023-10-22 NOTE — Progress Notes (Signed)
Inpatient Rehab Admissions Coordinator:     I have a CIR bed for this pt. RN may call report to [place], [place], [place] Admissions Coordinator  [phone] ([place]) [phone] (office)

## 2023-10-22 NOTE — Discharge Instructions (Signed)
 Inpatient Rehab Discharge Instructions  Rachel Clark Discharge date and time: No discharge date for patient encounter.   Activities/Precautions/ Functional Status: Activity: As tolerated Diet: Regular Wound Care: Routine skin checks Functional status:  ___ No restrictions     ___ Walk up steps independently ___ 24/7 supervision/assistance   ___ Walk up steps with assistance ___ Intermittent supervision/assistance  ___ Bathe/dress independently ___ Walk with walker     _x__ Bathe/dress with assistance ___ Walk Independently    ___ Shower independently ___ Walk with assistance    ___ Shower with assistance ___ No alcohol     ___ Return to work/school ________  Special Instructions: No driving smoking or alcohol   My questions have been answered and I understand these instructions. I will adhere to these goals and the provided educational materials after my discharge from the hospital.  Patient/Caregiver Signature _______________________________ Date __________  Clinician Signature _______________________________________ Date __________  Please bring this form and your medication list with you to all your follow-up doctor's appointments.

## 2023-10-22 NOTE — H&P (Signed)
 Physical Medicine and Rehabilitation Admission H&P         Chief Complaint  Patient presents with   Loss of Consciousness   Fall      FOT  : HPI: Rachel Clark is a 73 year old right-handed female with history of dementia maintained on Namenda , hypothyroidism, complete heart block status post permanent pacemaker as well as history of CABG with stents followed by cardiology service Dr. [place] maintained on low-dose aspirin , hypertension, history of GI bleed/gastric ulcer, epilepsy maintained on Tegretol , chronic anemia followed by hematology services Dr. Marton Sleeper as well as colon cancer.  Per chart review lives with husband and son.  Independent mobility but needed assistance due to dementia and safety awareness.  She was rather sedentary prior to admission.  Presented 10/17/2023 after fall.  No report of loss of consciousness.  Admission chemistries unremarkable except glucose 119, hemoglobin 8.9, WBC 13,500.  Cranial CT scan negative for acute changes.  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  CT cervical spine negative.  X-rays and CT imaging of the pelvis showed comminuted minimally displaced fracture of the left pubic body with extension along the left superior pubic ramus.  Nondisplaced fracture of the right sacral ala.  Follow-up orthopedic service Dr. Kevan Peers nonoperative weightbearing as tolerated.  Hospital course in regards to patient's acute on chronic anemia she was receiving iron infusions as an outpatient which was administered 10/22/2023 while in the hospital.  Hemoglobin ranges from 7.5-8.7.  She was cleared to continue her low-dose aspirin  therapy.  Lovenox  added for DVT prophylaxis 10/19/2023.  He did have some hyponatremia 130-134 and was placed on sodium chloride  tablets 1 g 3 times daily 10/21/2023.  Patient on Effient  as well as Repatha prior to admission remains on hold.  Therapy evaluations completed due to patient's decreased functional mobility was admitted  for a comprehensive rehab program.   Review of Systems  Constitutional:  Negative for chills and fever.  HENT:  Negative for hearing loss.   Eyes:  Negative for blurred vision and double vision.  Respiratory:  Negative for cough, shortness of breath and wheezing.   Cardiovascular:  Negative for chest pain, palpitations and leg swelling.  Gastrointestinal:  Positive for constipation. Negative for heartburn, nausea and vomiting.  Genitourinary:  Negative for dysuria, flank pain and hematuria.  Musculoskeletal:  Positive for falls and myalgias.  Skin:  Negative for rash.  Neurological:  Positive for seizures.  Psychiatric/Behavioral:  Positive for memory loss. The patient has insomnia.   All other systems reviewed and are negative.       Past Medical History:  Diagnosis Date   Dementia (HCC)          History reviewed. No pertinent surgical history.     History reviewed. No pertinent family history.     Social History:  reports that she has never smoked. She has never used smokeless tobacco. She reports that she does not drink alcohol and does not use drugs. Allergies:  Allergies  No Known Allergies         Medications Prior to Admission  Medication Sig Dispense Refill   acetaminophen  (TYLENOL ) 500 MG tablet Take 1,000 mg by mouth every 6 (six) hours as needed for mild pain (pain score 1-3).       amLODipine  (NORVASC ) 5 MG tablet Take 5 mg by mouth daily.       aspirin  EC 81 MG tablet Take 81 mg by mouth daily. Swallow whole.  calcium  carbonate (TUMS - DOSED IN MG ELEMENTAL CALCIUM ) 500 MG chewable tablet Chew 1 tablet by mouth daily.       carbamazepine  (TEGRETOL  XR) 200 MG 12 hr tablet Take 600 mg by mouth 2 (two) times daily.       DULoxetine  (CYMBALTA ) 60 MG capsule Take 60 mg by mouth 2 (two) times daily.       ethosuximide  (ZARONTIN ) 250 MG capsule Take 500 mg by mouth 2 (two) times daily.       isosorbide  mononitrate (IMDUR ) 30 MG 24 hr tablet Take 15 mg by mouth  daily.       levothyroxine  (SYNTHROID ) 50 MCG tablet Take 50 mcg by mouth daily.       memantine  (NAMENDA ) 10 MG tablet Take 10 mg by mouth 2 (two) times daily.       nitroGLYCERIN (NITROSTAT) 0.4 MG SL tablet Place 0.4 mg under the tongue every 5 (five) minutes as needed for chest pain.       pantoprazole  (PROTONIX ) 40 MG tablet Take 40 mg by mouth 2 (two) times daily.       prasugrel  (EFFIENT ) 10 MG TABS tablet Take 10 mg by mouth daily.       REPATHA SURECLICK 140 MG/ML SOAJ Inject 140 mg into the skin every 14 (fourteen) days.                  Home: Home Living Family/patient expects to be discharged to:: Private residence Living Arrangements: Spouse/significant other, Children Available Help at Discharge: Family, Available 24 hours/day Type of Home: House Home Access: Stairs to enter Secretary/administrator of Steps: 2 Entrance Stairs-Rails: Left Home Layout: One level Bathroom Shower/Tub: Health visitor: Handicapped height Bathroom Accessibility: Yes Home Equipment: Information systems manager, BSC/3in1 Additional Comments: dtr out of town, son in [place], Spouse is her caregiver  Lives With: Spouse   Functional History: Prior Function Prior Level of Function : Independent/Modified Independent, Needs assist Mobility Comments: independent ADLs Comments: requires assistance for all IADL tasks   Functional Status:  Mobility: Bed Mobility Overal bed mobility: Needs Assistance Bed Mobility: Supine to Sit, Sit to Sidelying, Rolling Rolling: Max assist, +2 for physical assistance Supine to sit: Mod assist, HOB elevated, Used rails Sit to supine: Max assist Sit to sidelying: Min assist, Max assist General bed mobility comments: supine to sit: required mod A for proper sequencing, hand placement, attention to task, and squaring hips at EOB. Sit to sidelying and rolling: pt required min A for proper sequencing to rest on L elbow before max A x2 with helicopter manuever to  supine and max A for LE elevation. Transfers Overall transfer level: Needs assistance Equipment used: Rolling walker (2 wheels) Transfers: Sit to/from Stand Sit to Stand: Mod assist, +2 physical assistance, From elevated surface General transfer comment: pt completed 4 sit to stands, 1st from lowest bed height and 3 from an elevated height. For all transfers, pt reported increased hip and buttocks pain that limited her ability to fully extend her knees and trunk and she would sit back down. Pt was unable  to march in place s/t hip/buttocks pain Ambulation/Gait General Gait Details: deferred s/t pt presentation   ADL: ADL Overall ADL's : Needs assistance/impaired Eating/Feeding: Modified independent Grooming: Set up, Sitting Upper Body Bathing: Set up, Sitting Lower Body Bathing: Maximal assistance, Bed level Upper Body Dressing : Minimal assistance, Sitting Lower Body Dressing: Maximal assistance Toilet Transfer: +2 for safety/equipment, Minimal assistance Toilet Transfer Details (indicate cue  type and reason): ambulated with nsg staff prior to session; near syncope event in bathroom Functional mobility during ADLs: Maximal assistance   Cognition: Cognition Orientation Level: Disoriented to situation, Disoriented to time, Disoriented to place, Oriented to person Cognition Arousal: Alert Behavior During Therapy: [facility] for tasks assessed/performed   Physical Exam: Blood pressure (!) 157/71, pulse 95, temperature 98.1 F (36.7 C), temperature source Oral, resp. rate 17, height 5\' 8"  (1.727 m), weight 80.1 kg, SpO2 99%. Physical Exam Constitutional:      General: She is not in acute distress.    Appearance: She is not ill-appearing.  HENT:     Head: Normocephalic and atraumatic.     Right Ear: External ear normal.     Left Ear: External ear normal.     Nose: Nose normal.  Eyes:     Extraocular Movements: Extraocular movements intact.     Conjunctiva/sclera: Conjunctivae normal.      Pupils: Pupils are equal, round, and reactive to light.  Cardiovascular:     Rate and Rhythm: Normal rate and regular rhythm.     Heart sounds: No murmur heard.    No gallop.  Pulmonary:     Effort: Pulmonary effort is normal. No respiratory distress.     Breath sounds: Normal breath sounds. No wheezing.  Abdominal:     General: Bowel sounds are normal. There is no distension.     Tenderness: There is no abdominal tenderness.  Musculoskeletal:        General: Swelling and tenderness present.     Cervical back: Normal range of motion and neck supple.  Skin:    General: Skin is warm.     Findings: Bruising present.  Neurological:     Comments: Pt is awake and alert. Follows basic commands. Answers biographical questions, some times very matter-of-factly. Fair insight and awareness, short term memory although husband interjects in frequently during conversation. CN exam non-focal. MMT: 4+ to 5/5 bilateral upper ext. RLE 3/5 HF, KE and 4/5 ADF/PF> LLE 2-/5 HF, KE (pain component) and 4/5 APF/ADF. Sensory exam normal for light touch and pain in all 4 limbs. No limb ataxia or cerebellar signs. No abnormal tone appreciated.    Psychiatric:     Comments: Pt a bit dry but pleasant and cooperative.         Lab Results Last 48 Hours        Results for orders placed or performed during the hospital encounter of 10/17/23 (from the past 48 hours)  CBC     Status: Abnormal    Collection Time: 10/20/23 11:46 AM  Result Value Ref Range    WBC 11.8 (H) 4.0 - 10.5 K/uL    RBC 2.73 (L) 3.87 - 5.11 MIL/uL    Hemoglobin 7.8 (L) 12.0 - 15.0 g/dL    HCT 84.1 (L) 32.4 - 46.0 %    MCV 86.8 80.0 - 100.0 fL    MCH 28.6 26.0 - 34.0 pg    MCHC 32.9 30.0 - 36.0 g/dL    RDW 40.1 (H) 02.7 - 15.5 %    Platelets 279 150 - 400 K/uL    nRBC 0.0 0.0 - 0.2 %      Comment: Performed at [facility], [address], [place], [place] [address]  Basic metabolic panel     Status: Abnormal    Collection  Time: 10/20/23 11:46 AM  Result Value Ref Range    Sodium 130 (L) 135 - 145 mmol/L  Potassium 4.0 3.5 - 5.1 mmol/L    Chloride 98 98 - 111 mmol/L    CO2 25 22 - 32 mmol/L    Glucose, Bld 133 (H) 70 - 99 mg/dL      Comment: Glucose reference range applies only to samples taken after fasting for at least 8 hours.    BUN 12 8 - 23 mg/dL    Creatinine, Ser 9.48 0.44 - 1.00 mg/dL    Calcium  8.2 (L) 8.9 - 10.3 mg/dL    GFR, Estimated >54 >62 mL/min      Comment: (NOTE) Calculated using the CKD-EPI Creatinine Equation (2021)      Anion gap 7 5 - 15      Comment: Performed at [facility], [address] [place], [place] [address]  Lipid panel     Status: None    Collection Time: 10/20/23  7:08 PM  Result Value Ref Range    Cholesterol 131 0 - 200 mg/dL    Triglycerides 86 <093 mg/dL    HDL 64 >81 mg/dL    Total CHOL/HDL Ratio 2.0 RATIO    VLDL 17 0 - 40 mg/dL    LDL Cholesterol 50 0 - 99 mg/dL      Comment:        Total Cholesterol/HDL:CHD Risk Coronary Heart Disease Risk Table                     Men   Women  1/2 Average Risk   3.4   3.3  Average Risk       5.0   4.4  2 X Average Risk   9.6   7.1  3 X Average Risk  23.4   11.0        Use the calculated Patient Ratio above and the CHD Risk Table to determine the patient's CHD Risk.        ATP III CLASSIFICATION (LDL):  <100     mg/dL   Optimal  829-937  mg/dL   Near or Above                    Optimal  130-159  mg/dL   Borderline  169-678  mg/dL   High  >938     mg/dL   Very High Performed at [facility], [address], [place], [place] [address]    CBC     Status: Abnormal    Collection Time: 10/21/23  3:43 PM  Result Value Ref Range    WBC 8.9 4.0 - 10.5 K/uL    RBC 2.66 (L) 3.87 - 5.11 MIL/uL    Hemoglobin 7.5 (L) 12.0 - 15.0 g/dL    HCT 10.2 (L) 58.5 - 46.0 %    MCV 88.3 80.0 - 100.0 fL    MCH 28.2 26.0 - 34.0 pg    MCHC 31.9 30.0 - 36.0 g/dL    RDW 27.7 (H) 82.4 - 15.5 %    Platelets 286 150 -  400 K/uL    nRBC 0.0 0.0 - 0.2 %      Comment: Performed at [facility], [address], [place], [place] [address]      Imaging Results (Last 48 hours)  No results found.         Blood pressure (!) 157/71, pulse 95, temperature 98.1 F (36.7 C), temperature source Oral, resp. rate 17, height 5\' 8"  (1.727 m), weight 80.1 kg, SpO2 99%.  Medical Problem List and Plan: 1. Functional deficits secondary to left sacral ala and superior pubic rami fracture.  Weightbearing as tolerated per Dr. Guyann Leitz             -patient may shower             -ELOS/Goals: 10-14 days, supervision goals with PT, and sup/min assist with OT and SLP(eval only?). 2.  Antithrombotics: -DVT/anticoagulation:  Pharmaceutical: Lovenox .  Check vascular study             -antiplatelet therapy: Aspirin  81 mg daily 3. Pain Management: Lidoderm  patch, oxycodone  as needed 4. Mood/Behavior/Sleep: Namenda  10 mg twice daily, Cymbalta  60 mg twice daily             -antipsychotic agents: N/A 5. Neuropsych/cognition: This patient is not quite capable of making decisions on her own behalf. 6. Skin/Wound Care: Routine skin checks 7. Fluids/Electrolytes/Nutrition: Routine in and outs with follow-up chemistries on admission 8.  Acute on chronic anemia/history of GI bleed/colon cancer.  Patient receives outpatient iron infusions.  Continue iron supplement.  Follow-up per hematology services Dr.Gorsuch 9.  Epilepsy.  Tegretol  600 mg twice daily as well as zarontin  500 mg twice daily. 10.  CAD/CABG/history of stents/PPM.  Continue low-dose aspirin .  Follow-up cardiology service Dr. [place].  Continue Imdur  15 mg daily 11.  Hypothyroidism.  Synthroid  12.  Hyponatremia.  Sodium chloride  tablets 1 g 3 times daily.  Follow-up chemistries tomorrow 13.Hypertension.Norvasc  5 mg daily.Monitor with increased mobility             -bp borderline at present.   Everlyn Hockey Angiulli, PA-C 10/22/2023  I have personally performed a face to face  diagnostic evaluation of this patient and formulated the key components of the plan.  Additionally, I have personally reviewed laboratory data, imaging studies, as well as relevant notes and concur with the physician assistant's documentation above.  The patient's status has not changed from the original H&P.  Any changes in documentation from the acute care chart have been noted above.  Rawland Caddy, MD, [facility]

## 2023-10-22 NOTE — Progress Notes (Signed)
 Report called to Faby Rn for 4w15. Patient with no complaints at the current time. Will transfer in bed to CIR. PAtients spouse at bedside.

## 2023-10-22 NOTE — Progress Notes (Incomplete)
 Inpatient Rehabilitation {Admission / Discharge:26440} Medication Review by a Pharmacist  A complete drug regimen review was completed for this patient to identify any potential clinically significant medication issues.  High Risk Drug Classes Is patient taking? Indication by Medication  Antipsychotic {Receiving?:26196}   Anticoagulant {Receiving?:26196}   Antibiotic {Receiving?:26196}   Opioid {Receiving?:26196}   Antiplatelet {Receiving?:26196}   Hypoglycemics/insulin {Yes or No?:26198}   Vasoactive Medication {Receiving?:26196}   Chemotherapy {Receiving Chemo?:26197}   Other {Yes or No?:26198} {Constipation:32046::"- constipation"} {Otherrehab:32047}  {Insomnia:32056::"-insomnia"}     Type of Medication Issue Identified Description of Issue Recommendation(s)  Drug Interaction(s) (clinically significant)     Duplicate Therapy     Allergy     No Medication Administration End Date     Incorrect Dose     Additional Drug Therapy Needed     Significant med changes from prior encounter (inform family/care partners about these prior to discharge).  Communicate relevant medication changes to patient/family members at discharge from CIR.   Restart or discontinue PTA meds not resumed in CIR at discharge if clinically indicated.   Other       Clinically significant medication issues were identified that warrant physician communication and completion of prescribed/recommended actions by midnight of the next day:  {Yes or No?:26198}  Name of provider notified for urgent issues identified:   Provider Method of Notification:     Pharmacist comments:   Time spent performing this drug regimen review (minutes):  ***

## 2023-10-22 NOTE — Progress Notes (Signed)
 Right AC IV catheter removed due to redness, purulent drainage, site cleaned and dried.

## 2023-10-22 NOTE — Progress Notes (Signed)
 PMR Admission Coordinator Pre-Admission Assessment   Patient: Rachel Clark is an 73 y.o., female MRN: [id number] DOB: 05-17-1951 Height: 5\' 8"  (172.7 cm) Weight: 80.1 kg   Insurance Information HMO:     PPO:      PCP:      IPA:      80/20:      OTHER:  PRIMARY: Medicare Part A and B      Policy#: [id number]      Subscriber: pt Benefits:  Phone #: passport one source online     Name: 4/18 Eff. Date: 05/03/2014     Deduct: $1676      Out of Pocket Max: none      Life Max: none CIR: 100%      SNF: 20 full days Outpatient: 80%     Co-Pay: 20% Home Health: 100%      Co-Pay: none DME: 80%     Co-Pay: 20% Providers: pt choice  SECONDARY: [employer]      Policy#: [id number]     Phone#:    Financial Counselor:       Phone#:    The "Data Collection Information Summary" for patients in Inpatient Rehabilitation Facilities with attached "Privacy Act Statement-Health Care Records" was provided and verbally reviewed with: Patient and Family   Emergency Contact Information Contact Information       Name Relation Home Work Mobile    Donaway,James Spouse [phone]             Other Contacts   None on File        Current Medical History  Patient Admitting Diagnosis: right sacral ala and left pubic rami fractures   History of Present Illness: Rachel Clark is a 73 year old right-handed female with history of dementia maintained on Namenda , hypothyroidism, complete heart block status post permanent pacemaker as well as history of CABG with stents followed by cardiology service Dr. [place] maintained on low-dose aspirin , hypertension, history of GI bleed/gastric ulcer, epilepsy maintained on Tegretol , chronic anemia followed by hematology services Dr. Marton Sleeper as well as colon cancer.  Per chart review lives with husband and son.  Independent mobility but needed assistance due to dementia and safety awareness.  She was rather sedentary prior to admission.  Presented 10/17/2023 after  fall.  No report of loss of consciousness.  Admission chemistries unremarkable except glucose 119, hemoglobin 8.9, WBC 13,500.  Cranial CT scan negative for acute changes.  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  CT cervical spine negative.  X-rays and CT imaging of the pelvis showed comminuted minimally displaced fracture of the left pubic body with extension along the left superior pubic ramus.  Nondisplaced fracture of the right sacral ala.  Follow-up orthopedic service Dr. Kevan Peers nonoperative weightbearing as tolerated.  Hospital course in regards to patient's acute on chronic anemia she was receiving iron  infusions as an outpatient which was administered 10/22/2023 while in the hospital.  Hemoglobin ranges from 7.5-8.7.  She was cleared to continue her low-dose aspirin  therapy.  Lovenox  added for DVT prophylaxis 10/19/2023.  He did have some hyponatremia 130-134 and was placed on sodium chloride  tablets 1 g 3 times daily 10/21/2023.  Patient on Effient  as well as Repatha  prior to admission remains on hold.  Therapy evaluations completed due to patient's decreased functional mobility was admitted for a comprehensive rehab program.    Patient's medical record from [facility] has been reviewed by the rehabilitation admission coordinator and physician.  Past Medical History  No past medical history on file.       Has the patient had major surgery during 100 days prior to admission? No   Family History   family history is not on file.   Current Medications  Current Medications    Current Facility-Administered Medications:    acetaminophen  (TYLENOL ) tablet 1,000 mg, 1,000 mg, Oral, Q6H, Lovick, Bard Boor, MD, 1,000 mg at 10/19/23 1130   amLODipine  (NORVASC ) tablet 5 mg, 5 mg, Oral, Daily, Johnson, Kelly R, PA-C, 5 mg at 10/18/23 1142   aspirin  EC tablet 81 mg, 81 mg, Oral, Daily, Annetta Killian, PA-C, 81 mg at 10/19/23 0900   calcium  carbonate (TUMS - dosed in mg  elemental calcium ) chewable tablet 200 mg of elemental calcium , 1 tablet, Oral, Daily, Johnson, Kelly R, PA-C, 200 mg of elemental calcium  at 10/19/23 0900   carbamazepine  (TEGRETOL  XR) 12 hr tablet 600 mg, 600 mg, Oral, BID, Johnson, Kelly R, PA-C, 600 mg at 10/19/23 0900   cholecalciferol  (VITAMIN D3) 25 MCG (1000 UNIT) tablet 4,000 Units, 4,000 Units, Oral, Daily, Annetta Killian, PA-C, 4,000 Units at 10/19/23 0900   cyanocobalamin  (VITAMIN B12) tablet 1,000 mcg, 1,000 mcg, Oral, Daily, Johnson, Kelly R, PA-C, 1,000 mcg at 10/19/23 0900   docusate sodium  (COLACE) capsule 100 mg, 100 mg, Oral, BID, Lovick, Bard Boor, MD, 100 mg at 10/19/23 0900   DULoxetine  (CYMBALTA ) DR capsule 60 mg, 60 mg, Oral, BID, Johnson, Kelly R, PA-C, 60 mg at 10/19/23 0900   enoxaparin  (LOVENOX ) injection 30 mg, 30 mg, Subcutaneous, Q12H, Lovick, Bard Boor, MD, 30 mg at 10/19/23 0900   ethosuximide  (ZARONTIN ) capsule 500 mg, 500 mg, Oral, BID, Johnson, Kelly R, PA-C, 500 mg at 10/19/23 0900   ferrous sulfate  tablet 325 mg, 325 mg, Oral, Q breakfast, Johnson, Kelly R, PA-C, 325 mg at 10/19/23 0825   fluticasone  (FLONASE ) 50 MCG/ACT nasal spray 1 spray, 1 spray, Each Nare, Daily, Annetta Killian, PA-C, 1 spray at 10/19/23 0900   haloperidol  lactate (HALDOL ) injection 2.5 mg, 2.5 mg, Intravenous, Q8H PRN, Lujean Sake, MD, 2.5 mg at 10/18/23 2204   hydrALAZINE  (APRESOLINE ) injection 10 mg, 10 mg, Intravenous, Q2H PRN, Anda Bamberg, MD   isosorbide  mononitrate (IMDUR ) 24 hr tablet 15 mg, 15 mg, Oral, Daily, Johnson, Kelly R, PA-C, 15 mg at 10/19/23 0900   levothyroxine  (SYNTHROID ) tablet 50 mcg, 50 mcg, Oral, Daily, Johnson, Kelly R, PA-C, 50 mcg at 10/19/23 0558   lidocaine  (LIDODERM ) 5 % 2 patch, 2 patch, Transdermal, Q24H, Annetta Killian, PA-C, 2 patch at 10/19/23 1130   memantine  (NAMENDA ) tablet 10 mg, 10 mg, Oral, BID, Johnson, Kelly R, PA-C, 10 mg at 10/19/23 0900   methocarbamol  (ROBAXIN ) tablet 1,000 mg,  1,000 mg, Oral, Q8H, 1,000 mg at 10/19/23 1130 **OR** [DISCONTINUED] methocarbamol  (ROBAXIN ) injection 500 mg, 500 mg, Intravenous, Q8H, Lovick, Bard Boor, MD   metoprolol  tartrate (LOPRESSOR ) injection 5 mg, 5 mg, Intravenous, Q6H PRN, Anda Bamberg, MD   morphine  (PF) 2 MG/ML injection 2 mg, 2 mg, Intravenous, Q4H PRN, Annetta Killian, PA-C   ondansetron  (ZOFRAN -ODT) disintegrating tablet 4 mg, 4 mg, Oral, Q6H PRN **OR** ondansetron  (ZOFRAN ) injection 4 mg, 4 mg, Intravenous, Q6H PRN, Lovick, Bard Boor, MD   oxyCODONE  (Oxy IR/ROXICODONE ) immediate release tablet 2.5-5 mg, 2.5-5 mg, Oral, Q4H PRN, Anda Bamberg, MD, 5 mg at 10/18/23 2034   pantoprazole  (PROTONIX ) EC tablet 40 mg, 40 mg, Oral, BID, Lincoln Renshaw,  Oral, BID, Johnson, Kelly R, PA-C, 40 mg at 10/19/23 0900   polyethylene glycol (MIRALAX  / GLYCOLAX ) packet 17 g, 17 g, Oral, BID, Johnson, Kelly R, PA-C, 17 g at 10/19/23 0900      Patients Current Diet:  Diet Order                        Diet regular Room service appropriate? Yes; Fluid consistency: Thin  Diet effective now                         Precautions / Restrictions Precautions Precautions: Fall, ICD/Pacemaker, Other (comment) (orthostatic) Precaution/Restrictions Comments: watch BP; premedicate Restrictions Weight Bearing Restrictions Per Provider Order: No    Has the patient had 2 or more falls or a fall with injury in the past year? Yes   Prior Activity Level Community (5-7x/wk): independent without AD   Prior Functional Level Self Care: Did the patient need help bathing, dressing, using the toilet or eating? Independent   Indoor Mobility: Did the patient need assistance with walking from room to room (with or without device)? Independent   Stairs: Did the patient need assistance with internal or external stairs (with or without device)? Independent   Functional Cognition: Did the patient need help planning regular tasks such as shopping or remembering to take medications?  Independent   Patient Information Are you of Hispanic, Latino/a,or Spanish origin?: A. No, not of Hispanic, Latino/a, or Spanish origin What is your race?: A. White ([place]) Do you need or want an interpreter to communicate with a doctor or health care staff?: 0. No   Patient's Response To:  Health Literacy and Transportation Is the patient able to respond to health literacy and transportation needs?: Yes Health Literacy - How often do you need to have someone help you when you read instructions, pamphlets, or other written material from your doctor or pharmacy?: Never In the past 12 months, has lack of transportation kept you from medical appointments or from getting medications?: No In the past 12 months, has lack of transportation kept you from meetings, work, or from getting things needed for daily living?: No   Journalist, newspaper / Equipment Home Equipment: Information systems manager, BSC/3in1   Prior Device Use: Indicate devices/aids used by the patient prior to current illness, exacerbation or injury? None of the above   Current Functional Level Cognition   Orientation Level: Oriented to person, Oriented to place, Disoriented to situation, Disoriented to time    Extremity Assessment (includes Sensation/Coordination)   Upper Extremity Assessment: Defer to OT evaluation  Lower Extremity Assessment: RLE deficits/detail, LLE deficits/detail RLE Deficits / Details: able to fully extend knee against gravity but it causes hip and knee pain LLE Deficits / Details: able to extend knee against gravity     ADLs   Overall ADL's : Needs assistance/impaired Eating/Feeding: Modified independent Grooming: Set up, Sitting Upper Body Bathing: Set up, Sitting Lower Body Bathing: Maximal assistance, Bed level Upper Body Dressing : Minimal assistance, Sitting Lower Body Dressing: Maximal assistance Toilet Transfer: +2 for safety/equipment, Minimal assistance Toilet Transfer Details (indicate cue type  and reason): ambulated with nsg staff prior to session; near syncope event in bathroom Functional mobility during ADLs: Maximal assistance     Mobility   Overal bed mobility: Needs Assistance Bed Mobility: Supine to Sit, Sit to Sidelying, Rolling Rolling: Max assist, +2 for physical assistance Supine to sit: Mod assist, HOB elevated, Used rails  Sit to supine: Max assist Sit to sidelying: Min assist, Max assist General bed mobility comments: supine to sit: required mod A for proper sequencing, hand placement, attention to task, and squaring hips at EOB. Sit to sidelying and rolling: pt required min A for proper sequencing to rest on L elbow before max A x2 with helicopter manuever to supine and max A for LE elevation.     Transfers   Overall transfer level: Needs assistance Equipment used: Rolling walker (2 wheels) Transfers: Sit to/from Stand Sit to Stand: Mod assist, +2 physical assistance, From elevated surface General transfer comment: pt completed 4 sit to stands, 1st from lowest bed height and 3 from an elevated height. For all transfers, pt reported increased hip and buttocks pain that limited her ability to fully extend her knees and trunk and she would sit back down. Pt was unable  to march in place s/t hip/buttocks pain     Ambulation / Gait / Stairs / Wheelchair Mobility   Ambulation/Gait General Gait Details: deferred s/t pt presentation     Posture / Balance Dynamic Sitting Balance Sitting balance - Comments: pt sits EOB without LOB Balance Overall balance assessment: Needs assistance, History of Falls Sitting-balance support: No upper extremity supported, Feet supported, Bilateral upper extremity supported Sitting balance-Leahy Scale: Fair Sitting balance - Comments: pt sits EOB without LOB Standing balance support: Reliant on assistive device for balance, During functional activity, Bilateral upper extremity supported Standing balance-Leahy Scale: Poor Standing balance  comment: pt reliant on RW and therapist assistance for standing balance. Stands with knees still flexed. No LOB     Special needs/care consideration Spouse manages medication pill box Family reports worsening of her memory Seizure precautions    Previous Home Environment  Living Arrangements:  (lives with spouse, youngest son to return to his own home; son recent brain surgery/seizures and needed Dad to provide supervision postop ( son returning to his own home))  Lives With: Spouse Available Help at Discharge: Family, Available 24 hours/day Type of Home: House Home Layout: One level Home Access: Stairs to enter Entrance Stairs-Rails: Left Entrance Stairs-Number of Steps: 2 Bathroom Shower/Tub: Health visitor: Handicapped height Bathroom Accessibility: Yes How Accessible: Accessible via wheelchair Home Care Services: No Additional Comments: dtr out of town, son in [place], Spouse is her caregiver   Discharge Living Setting Plans for Discharge Living Setting: Patient's home, Lives with (comment) (Spouse) Type of Home at Discharge: House Discharge Home Layout: One level Discharge Home Access: Stairs to enter Entrance Stairs-Rails: Left Entrance Stairs-Number of Steps: 2 Discharge Bathroom Shower/Tub: Walk-in shower Discharge Bathroom Toilet: Handicapped height Discharge Bathroom Accessibility: Yes How Accessible: Accessible via walker Does the patient have any problems obtaining your medications?: No   Social/Family/Support Systems Patient Roles: Spouse, Parent Contact Information: spouse, Royston Cornea Anticipated Caregiver: spouse Anticipated Industrial/product designer Information: see contacts Ability/Limitations of Caregiver: spouse has back issues, can not "lift" her Caregiver Availability: 24/7 Discharge Plan Discussed with Primary Caregiver: Yes Is Caregiver In Agreement with Plan?: Yes Does Caregiver/Family have Issues with Lodging/Transportation while Pt is in  Rehab?: No   Goals Patient/Family Goal for Rehab: supervision PT, superivsion to min OT, supervision SLP Expected length of stay: ELOS 10 to 14 days Additional Information: SPouse manages her medication pill box system Pt/Family Agrees to Admission and willing to participate: Yes Program Orientation Provided & Reviewed with Pt/Caregiver Including Roles  & Responsibilities: Yes   Decrease burden of Care through IP rehab admission: n/a  Possible need for SNF placement upon discharge: not anticipated   Patient Condition: I have reviewed medical records from [facility], spoken with  patient, spouse, and son. I met with patient at the bedside for inpatient rehabilitation assessment.  Patient will benefit from ongoing PT and OT, can actively participate in 3 hours of therapy a day 5 days of the week, and can make measurable gains during the admission.  Patient will also benefit from the coordinated team approach during an Inpatient Acute Rehabilitation admission.  The patient will receive intensive therapy as well as Rehabilitation physician, nursing, social worker, and care management interventions.  Due to bladder management, bowel management, safety, skin/wound care, disease management, medication administration, pain management, and patient education the patient requires 24 hour a day rehabilitation nursing.  The patient is currently mod A with mobility and basic ADLs.  Discharge setting and therapy post discharge at home with home health is anticipated.  Patient has agreed to participate in the Acute Inpatient Rehabilitation Program and will admit today.   Preadmission Screen Completed By:  Tanya Fantasia RN MSN 10/19/2023 2:40 PM ______________________________________________________________________   Discussed status with Dr. Rachel Budds  on 10/22/23 at 900 and received approval for admission today.   Admission Coordinator:  Tanya Fantasia, RN MSN time 1055/Date 10/22/23     Assessment/Plan: Diagnosis: right sacral ala and left pubic rami fx's Does the need for close, 24 hr/day Medical supervision in concert with the patient's rehab needs make it unreasonable for this patient to be served in a less intensive setting? Yes Co-Morbidities requiring supervision/potential complications: dementia, CHB, CAD, GIB Due to bladder management, bowel management, safety, skin/wound care, disease management, medication administration, pain management, and patient education, does the patient require 24 hr/day rehab nursing? Yes Does the patient require coordinated care of a physician, rehab nurse, PT, OT, and SLP to address physical and functional deficits in the context of the above medical diagnosis(es)? Yes Addressing deficits in the following areas: balance, endurance, locomotion, strength, transferring, bowel/bladder control, bathing, dressing, feeding, grooming, toileting, cognition, and psychosocial support Can the patient actively participate in an intensive therapy program of at least 3 hrs of therapy 5 days a week? Yes The potential for patient to make measurable gains while on inpatient rehab is excellent Anticipated functional outcomes upon discharge from inpatient rehab: supervision PT, supervision and min assist OT, supervision SLP Estimated rehab length of stay to reach the above functional goals is: 10-14 days Anticipated discharge destination: Home 10. Overall Rehab/Functional Prognosis: excellent     MD Signature: Rawland Caddy, MD, [facility] Physical Medicine & Rehabilitation Medical Director Rehabilitation Services 10/22/2023

## 2023-10-23 ENCOUNTER — Inpatient Hospital Stay (HOSPITAL_COMMUNITY)

## 2023-10-23 DIAGNOSIS — S3282XD Multiple fractures of pelvis without disruption of pelvic ring, subsequent encounter for fracture with routine healing: Secondary | ICD-10-CM | POA: Diagnosis not present

## 2023-10-23 DIAGNOSIS — I10 Essential (primary) hypertension: Secondary | ICD-10-CM | POA: Diagnosis not present

## 2023-10-23 DIAGNOSIS — M7989 Other specified soft tissue disorders: Secondary | ICD-10-CM | POA: Diagnosis not present

## 2023-10-23 DIAGNOSIS — I25810 Atherosclerosis of coronary artery bypass graft(s) without angina pectoris: Secondary | ICD-10-CM | POA: Diagnosis not present

## 2023-10-23 DIAGNOSIS — E871 Hypo-osmolality and hyponatremia: Secondary | ICD-10-CM | POA: Diagnosis not present

## 2023-10-23 LAB — COMPREHENSIVE METABOLIC PANEL WITH GFR
ALT: 17 U/L (ref 0–44)
AST: 26 U/L (ref 15–41)
Albumin: 2.8 g/dL — ABNORMAL LOW (ref 3.5–5.0)
Alkaline Phosphatase: 50 U/L (ref 38–126)
Anion gap: 8 (ref 5–15)
BUN: 17 mg/dL (ref 8–23)
CO2: 21 mmol/L — ABNORMAL LOW (ref 22–32)
Calcium: 8.3 mg/dL — ABNORMAL LOW (ref 8.9–10.3)
Chloride: 103 mmol/L (ref 98–111)
Creatinine, Ser: 0.71 mg/dL (ref 0.44–1.00)
GFR, Estimated: >60 mL/min (ref >60)
Glucose, Bld: 114 mg/dL — ABNORMAL HIGH (ref 70–99)
Potassium: 4 mmol/L (ref 3.5–5.1)
Sodium: 132 mmol/L — ABNORMAL LOW (ref 135–145)
Total Bilirubin: 0.7 mg/dL (ref 0.0–1.2)
Total Protein: 6.1 g/dL — ABNORMAL LOW (ref 6.5–8.1)

## 2023-10-23 LAB — CBC WITH DIFFERENTIAL/PLATELET
Abs Immature Granulocytes: 0.06 10*3/uL (ref 0.00–0.07)
Basophils Absolute: 0.1 10*3/uL (ref 0.0–0.1)
Basophils Relative: 1 %
Eosinophils Absolute: 0.3 10*3/uL (ref 0.0–0.5)
Eosinophils Relative: 4 %
HCT: 25.7 % — ABNORMAL LOW (ref 36.0–46.0)
Hemoglobin: 8.3 g/dL — ABNORMAL LOW (ref 12.0–15.0)
Immature Granulocytes: 1 %
Lymphocytes Relative: 17 %
Lymphs Abs: 1.5 10*3/uL (ref 0.7–4.0)
MCH: 28.3 pg (ref 26.0–34.0)
MCHC: 32.3 g/dL (ref 30.0–36.0)
MCV: 87.7 fL (ref 80.0–100.0)
Monocytes Absolute: 0.8 10*3/uL (ref 0.1–1.0)
Monocytes Relative: 9 %
Neutro Abs: 5.8 10*3/uL (ref 1.7–7.7)
Neutrophils Relative %: 68 %
Platelets: 365 10*3/uL (ref 150–400)
RBC: 2.93 MIL/uL — ABNORMAL LOW (ref 3.87–5.11)
RDW: 22.4 % — ABNORMAL HIGH (ref 11.5–15.5)
WBC: 8.5 10*3/uL (ref 4.0–10.5)
nRBC: 0.2 % (ref 0.0–0.2)

## 2023-10-23 MED ORDER — AMLODIPINE BESYLATE 5 MG PO TABS
7.5000 mg | ORAL_TABLET | Freq: Every day | ORAL | Status: DC
  Administered 2023-10-24 – 2023-10-29 (×6): 7.5 mg via ORAL
  Filled 2023-10-23 (×6): qty 1

## 2023-10-23 NOTE — Plan of Care (Signed)
  Problem: RH Balance Goal: LTG Patient will maintain dynamic standing with ADLs (OT) Description: LTG:  Patient will maintain dynamic standing balance with assist during activities of daily living (OT)  Flowsheets (Taken 10/23/2023 1636) LTG: Pt will maintain dynamic standing balance during ADLs with: Supervision/Verbal cueing   Problem: Sit to Stand Goal: LTG:  Patient will perform sit to stand in prep for activites of daily living with assistance level (OT) Description: LTG:  Patient will perform sit to stand in prep for activites of daily living with assistance level (OT) Flowsheets (Taken 10/23/2023 1636) LTG: PT will perform sit to stand in prep for activites of daily living with assistance level: Supervision/Verbal cueing   Problem: RH Bathing Goal: LTG Patient will bathe all body parts with assist levels (OT) Description: LTG: Patient will bathe all body parts with assist levels (OT) Flowsheets (Taken 10/23/2023 1636) LTG: Pt will perform bathing with assistance level/cueing: Supervision/Verbal cueing   Problem: RH Dressing Goal: LTG Patient will perform lower body dressing w/assist (OT) Description: LTG: Patient will perform lower body dressing with assist, with/without cues in positioning using equipment (OT) Flowsheets (Taken 10/23/2023 1636) LTG: Pt will perform lower body dressing with assistance level of: Supervision/Verbal cueing   Problem: RH Toileting Goal: LTG Patient will perform toileting task (3/3 steps) with assistance level (OT) Description: LTG: Patient will perform toileting task (3/3 steps) with assistance level (OT)  Flowsheets (Taken 10/23/2023 1636) LTG: Pt will perform toileting task (3/3 steps) with assistance level: Supervision/Verbal cueing   Problem: RH Toilet Transfers Goal: LTG Patient will perform toilet transfers w/assist (OT) Description: LTG: Patient will perform toilet transfers with assist, with/without cues using equipment (OT) Flowsheets (Taken  10/23/2023 1636) LTG: Pt will perform toilet transfers with assistance level of: Supervision/Verbal cueing   Problem: RH Tub/Shower Transfers Goal: LTG Patient will perform tub/shower transfers w/assist (OT) Description: LTG: Patient will perform tub/shower transfers with assist, with/without cues using equipment (OT) Flowsheets (Taken 10/23/2023 1636) LTG: Pt will perform tub/shower stall transfers with assistance level of: Supervision/Verbal cueing   Problem: RH Memory Goal: LTG Patient will demonstrate ability for day to day recall/carry over during activities of daily living with assistance level (OT) Description: LTG:  Patient will demonstrate ability for day to day recall/carry over during activities of daily living with assistance level (OT). Flowsheets (Taken 10/23/2023 1636) LTG:  Patient will demonstrate ability for day to day recall/carry over during activities of daily living with assistance level (OT): Modified Independent

## 2023-10-23 NOTE — Progress Notes (Addendum)
 Inpatient Rehabilitation Admission Medication Review by a Pharmacist  A complete drug regimen review was completed for this patient to identify any potential clinically significant medication issues.  High Risk Drug Classes Is patient taking? Indication by Medication  Antipsychotic No   Anticoagulant Yes Enoxaparin -VTE px  Antibiotic No   Opioid Yes Oxycodone -pain  Antiplatelet Yes Aspirin -CAD  Hypoglycemics/insulin No   Vasoactive Medication Yes Amlodipine -HTN Isosorbide  Mononitrate-CAD  Chemotherapy No   Other Yes Calcium  carbonate, Cholecalciferol , Cyanocobalamin , ferrous sulfate -supplementation Sodium chloride -hyponatremia Carbamazepine , ethosuxamide-Epilepsy Docusate, Miralax -constipation Duloxetine -MDD Fluticasone -allergies Levothyroxine -hypothyroidism Lidocaine -pain Memantine -dementia Ondansetron -N/V Pantoprazole -GERD     Type of Medication Issue Identified Description of Issue Recommendation(s)  Drug Interaction(s) (clinically significant)     Duplicate Therapy     Allergy     No Medication Administration End Date     Incorrect Dose     Additional Drug Therapy Needed  Patient's prasugrel  on hold per Trauma F/u restart for prasugrel  in CIR  Significant med changes from prior encounter (inform family/care partners about these prior to discharge). Repatha, bempidoic acid on hold (non-formulary) Restart upon discharge from CIR  Other       Clinically significant medication issues were identified that warrant physician communication and completion of prescribed/recommended actions by midnight of the next day:  No  Name of provider notified for urgent issues identified:   Provider Method of Notification:     Pharmacist comments:   Time spent performing this drug regimen review (minutes):  20  Seaborn Nakama A. Brynn Caras, PharmD, BCPS, FNKF Clinical Pharmacist [place] Please utilize Amion for appropriate phone number to reach the unit pharmacist [facility]  Pharmacy)  10/23/2023 7:03 AM

## 2023-10-23 NOTE — Evaluation (Signed)
 Occupational Therapy Assessment and Plan  Patient Details  Name: Rachel Clark MRN: [id number] Date of Birth: 1951-04-11  OT Diagnosis: abnormal posture, acute pain, cognitive deficits, muscle weakness (generalized), pain in joint, and decreased activity Rehab Potential: Rehab Potential (ACUTE ONLY): Fair ELOS: 10-14 days   Today's Date: 10/23/2023 OT Individual Time: 6578-4696 OT Individual Time Calculation (min): 59 min     Today's Date: 10/23/2023 OT Individual Time: 1450-1530 OT Individual Time Calculation (min): 40 min     Hospital Problem: Principal Problem:   Pelvic fracture (HCC)   Past Medical History:  Past Medical History:  Diagnosis Date   Arthritis    "fingers" (06/29/2016)   Chronic lower back pain    Colon cancer (HCC) 12/04/2011   s/p Laparoscopic-assisted transverse colectomy on 12/19/2011 by Dr. Delane Fear.  pT3 N0 M0.    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   last seizure was in 1972 ;takes Tegretol  (06/29/2016)    Headache    "weekly" (06/29/2016)   Hyperlipidemia    Hyponatremia    Hypothyroid    Iron deficiency anemia 11/17/2011   Memory change 11/13/2016   Monoclonal gammopathy of unknown significance    Orthostatic hypotension    Presence of permanent cardiac pacemaker    Syncope and collapse    pacemaker implanted   Past Surgical History:  Past Surgical History:  Procedure Laterality Date   ANTERIOR CERVICAL DECOMP/DISCECTOMY FUSION  1980's   BACK SURGERY     CARDIAC CATHETERIZATION  05/20/2009   obstructive native vessel disease in LAD, RCA, and first diagonal, patent vein graft to distal RCA and LIMA to LAD,normal. ef 60%   CARDIAC CATHETERIZATION N/A 03/24/2015   Procedure: Left Heart Cath and Cors/Grafts Angiography;  Surgeon:  Wenona Hamilton, MD;  Location: [facility];  Service: Cardiovascular;  Laterality: N/A;   CARDIAC CATHETERIZATION N/A 07/28/2015   Procedure: Left Heart Cath and Coronary Angiography;  Surgeon: Millicent Ally, MD;  Location: [facility];  Service: Cardiovascular;  Laterality: N/A;   CARDIAC CATHETERIZATION N/A 11/09/2015   Procedure: Coronary Stent Intervention;  Surgeon: Arnoldo Lapping, MD;  Location: [facility] INVASIVE CV LAB;  Service: Cardiovascular;  Laterality: N/A;   CARDIAC CATHETERIZATION N/A 11/09/2015   Procedure: Left Heart Cath and Coronary Angiography;  Surgeon: Arnoldo Lapping, MD;  Location: [facility] INVASIVE CV LAB;  Service: Cardiovascular;  Laterality: N/A;   CARDIAC CATHETERIZATION N/A 06/29/2016   Procedure: Left Heart Cath and Coronary Angiography;  Surgeon: Sammy Crisp, MD;  Location: [facility] INVASIVE CV LAB;  Service: Cardiovascular;  Laterality: N/A;   CARDIAC CATHETERIZATION N/A 06/29/2016   Procedure: Intravascular Pressure Wire/FFR Study;  Surgeon: Sammy Crisp, MD;  Location: [facility] INVASIVE CV LAB;  Service: Cardiovascular;  Laterality: N/A;   CARDIAC CATHETERIZATION N/A 06/29/2016   Procedure: Coronary Balloon Angioplasty;  Surgeon: Sammy Crisp, MD;  Location: [facility];  Service: Cardiovascular;  Laterality: N/A;   COLON RESECTION  12/19/2011   Procedure: COLON RESECTION LAPAROSCOPIC;  Surgeon: Enid Harry, MD;  Location: [facility] OR;  Service: General;  Laterality: N/A;  laparoscopic hand assisted partial colon resection   COLON SURGERY     COLONOSCOPY     COLONOSCOPY WITH PROPOFOL N/A 07/28/2023  Procedure: COLONOSCOPY WITH PROPOFOL ;  Surgeon: Felecia Hopper, MD;  Location: [facility] ENDOSCOPY;  Service: Gastroenterology;  Laterality: N/A;   CORONARY ANGIOPLASTY WITH STENT PLACEMENT  ~ 2007   1 stent   CORONARY ARTERY BYPASS GRAFT  2007   "CABG X2"   DILATION AND CURETTAGE OF UTERUS  1973   EP IMPLANTABLE DEVICE N/A 03/25/2015   MDT Advisa DR pacemaker  implanted by Dr Nunzio Belch for transient complete heart block and syncope   ESOPHAGOGASTRODUODENOSCOPY (EGD) WITH PROPOFOL  N/A 03/14/2021   Procedure: ESOPHAGOGASTRODUODENOSCOPY (EGD) WITH PROPOFOL ;  Surgeon: Genell Ken, MD;  Location: [facility] ENDOSCOPY;  Service: Gastroenterology;  Laterality: N/A;   ESOPHAGOGASTRODUODENOSCOPY (EGD) WITH PROPOFOL  N/A 11/13/2021   Procedure: ESOPHAGOGASTRODUODENOSCOPY (EGD) WITH PROPOFOL ;  Surgeon: Felecia Hopper, MD;  Location: [facility]OS[facility];  Service: Gastroenterology;  Laterality: N/A;   ESOPHAGOGASTRODUODENOSCOPY (EGD) WITH PROPOFOL  N/A 02/07/2023   Procedure: ESOPHAGOGASTRODUODENOSCOPY (EGD) WITH PROPOFOL ;  Surgeon: Felecia Hopper, MD;  Location: [facility] ENDOS[facility];  Service: Gastroenterology;  Laterality: N/A;   HEMOSTASIS CLIP PLACEMENT  03/14/2021   Procedure: HEMOSTASIS CLIP PLACEMENT;  Surgeon: Genell Ken, MD;  Location: [facility] ENDOSCOPY;  Service: Gastroenterology;;   HEMOSTASIS CLIP PLACEMENT  07/28/2023   Procedure: HEMOSTASIS CLIP PLACEMENT;  Surgeon: Felecia Hopper, MD;  Location: [facility] ENDOS[facility];  Service: Gastroenterology;;   HOT HEMOSTASIS N/A 07/28/2023   Procedure: HOT HEMOSTASIS (ARGON PLASMA COAGULATION/BICAP);  Surgeon: Felecia Hopper, MD;  Location: [facility] ENDOSCOPY;  Service: Gastroenterology;  Laterality: N/A;   INSERT / REPLACE / REMOVE PACEMAKER     LEFT HEART CATH AND CORONARY ANGIOGRAPHY N/A 11/11/2021   Procedure: LEFT HEART CATH AND CORONARY ANGIOGRAPHY;  Surgeon: Arleen Lacer, MD;  Location: [facility] INVASIVE CV LAB;  Service: Cardiovascular;  Laterality: N/A;   LEFT HEART CATH AND CORS/GRAFTS ANGIOGRAPHY N/A 02/21/2018   Procedure: LEFT HEART CATH AND CORS/GRAFTS ANGIOGRAPHY;  Surgeon: Avanell Leigh, MD;  Location: [facility];  Service: Cardiovascular;  Laterality: N/A;   LEFT HEART CATH AND CORS/GRAFTS ANGIOGRAPHY N/A 03/08/2021   Procedure: LEFT HEART CATH AND CORS/GRAFTS ANGIOGRAPHY;  Surgeon: Swazil[place], Peter M, MD;  Location: [facility] INVASIVE CV  LAB;  Service: Cardiovascular;  Laterality: N/A;   PORT-A-CATH REMOVAL N/A 06/12/2013   Procedure: REMOVAL PORT-A-CATH;  Surgeon: Enid Harry, MD;  Location: [facility];  Service: General;  Laterality: N/A;   PORTACATH PLACEMENT  06/04/2012   Procedure: INSERTION PORT-A-CATH;  Surgeon: Enid Harry, MD;  Location: [facility] OR;  Service: General;  Laterality: N/A;  Insertion of port-a-cath    POSTERIOR LAMINECTOMY / DECOMPRESSION CERVICAL SPINE  1990s   SCLEROTHERAPY  03/14/2021   Procedure: SCLEROTHERAPY;  Surgeon: Genell Ken, MD;  Location: [facility] ENDOSCOPY;  Service: Gastroenterology;;   VAGINAL HYSTERECTOMY      Assessment & Plan Clinical Impression: Patient is a 73 year old right-handed female with history of dementia maintained on [place] , hypothyroidism, complete heart block status post permanent pacemaker as well as history of CABG with stents followed by cardiology service Dr. [place] maintained on low-dose aspirin , hypertension, history of GI bleed/gastric ulcer, epilepsy maintained on Tegretol , chronic anemia followed by hematology services Dr. Marton Sleeper as well as colon cancer. Per chart review lives with husband and son. Independent mobility but needed assistance due to dementia and safety awareness. She was rather sedentary prior to admission. Presented 10/17/2023 after fall. No report of loss of consciousness. Admission chemistries unremarkable except glucose 119, hemoglobin 8.9, WBC 13,500. Cranial CT scan negative for acute changes. 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 CT cervical  spine negative. X-rays and CT imaging of the pelvis showed comminuted minimally displaced fracture of the left pubic body with extension along the left superior pubic ramus. Nondisplaced fracture of the right sacral ala. Follow-up orthopedic service Dr. Kevan Peers nonoperative weightbearing as tolerated. Hospital course in regards to patient's acute on chronic anemia she was  receiving iron  infusions as an outpatient which was administered 10/22/2023 while in the hospital. Hemoglobin ranges from 7.5-8.7. She was cleared to continue her low-dose aspirin  therapy. Lovenox  added for DVT prophylaxis 10/19/2023. He did have some hyponatremia 130-134 and was placed on sodium chloride  tablets 1 g 3 times daily 10/21/2023. Patient on Effient  as well as Repatha  prior to admission remains on hold. Patient transferred to CIR on 10/22/2023 .    Patient currently requires Mod-Max A with basic self-care skills secondary to muscle weakness and muscle joint tightness, decreased cardiorespiratoy endurance, decreased safety awareness and decreased memory, and decreased standing balance and decreased balance strategies. Prior to hospitalization, patient could complete BADLs with supervision.   Patient will benefit from skilled intervention to decrease level of assist with basic self-care skills prior to discharge home with care partner.  Anticipate patient will require 24 hour supervision and follow up home health.  OT - End of Session Endurance Deficit: Yes OT Assessment Rehab Potential (ACUTE ONLY): Fair OT Barriers to Discharge: Incontinence OT Patient demonstrates impairments in the following area(s): Balance;Cognition;Endurance;Pain;Safety OT Basic ADL's Functional Problem(s): Bathing;Dressing;Toileting OT Transfers Functional Problem(s): Toilet;Tub/Shower OT Plan OT Frequency: 5 out of 7 days OT Duration/Estimated Length of Stay: 10-14 days OT Treatment/Interventions: Balance/vestibular training;Disease mangement/prevention;Cognitive remediation/compensation;Community reintegration;Discharge planning;DME/adaptive equipment instruction;Functional electrical stimulation;Functional mobility training;Neuromuscular re-education;Pain management;Patient/family education;Psychosocial support;Self Care/advanced ADL retraining;Skin care/wound managment;Splinting/orthotics;Therapeutic  Activities;UE/LE Strength taining/ROM;UE/LE Coordination activities;Visual/perceptual remediation/compensation;Therapeutic Exercise;Wheelchair propulsion/positioning OT Basic Self-Care Anticipated Outcome(s): Supervision OT Toileting Anticipated Outcome(s): Supervision OT Bathroom Transfers Anticipated Outcome(s): Supervision OT Recommendation Patient destination: Home Follow Up Recommendations: Home health OT Equipment Recommended: To be determined   OT Evaluation Precautions/Restrictions  Precautions Precautions: Fall Recall of Precautions/Restrictions: Impaired Precaution/Restrictions Comments: WBAT, Dementia, PPM (watch BP) Restrictions Weight Bearing Restrictions Per Provider Order: No General Chart Reviewed: Yes Family/Caregiver Present: Yes (Spouse, James, via phone call.) Pain Pain Assessment Pain Scale: 0-10 Pain Score: 0-No pain (AT REST) Faces Pain Scale: Hurts little more Pain Location: Pelvis Pain Intervention(s): Medication (See eMAR) Home Living/Prior Functioning Home Living Living Arrangements: Spouse/significant other, Children Available Help at Discharge: Family, Available 24 hours/day Type of Home: House Home Access: Stairs to enter [employer] of Steps: 2 to enter laundry room, then 2 more to enter living room. Entrance Stairs-Rails: Left Home Layout: One level Bathroom Shower/Tub: IT sales professional & BSC) Bathroom Toilet: Handicapped height Bathroom Accessibility: Yes Additional Comments: Information gathered from chart review as patient is a questionable historian, plan to verify with spouse.  Lives With: Spouse (Spouse, Royston Cornea. Royston Cornea is also a caretaker for adult son who is recovering from neurological surgery.) IADL History Homemaking Responsibilities: No Prior Function Level of Independence: Independent with gait, Independent with transfers, Needs assistance with homemaking (Distant supervision for  bathing/toileting.) Driving: No Vocation: Retired Administrator, sports Baseline Vision/History: 1 Wears glasses Ability to See in Adequate Light: 0 Adequate Patient Visual Report: Other (comment) (To be further assessed in functional context) Vision Assessment?: Vision impaired- to be further tested in functional context Perception  Perception: Within Functional Limits Praxis Praxis: WFL Cognition Cognition Overall Cognitive Status: History of cognitive impairments - at baseline Arousal/Alertness: Awake/alert Memory: Impaired Memory Impairment: Retrieval deficit Awareness: Impaired Problem  Solving: Impaired Safety/Judgment: Impaired Brief Interview for Mental Status (BIMS) Repetition of Three Words (First Attempt): 3 Temporal Orientation: Year: Nonsensical Temporal Orientation: Month: Missed by 6 days to 1 month Temporal Orientation: Day: No answer Recall: "Sock": No, could not recall Recall: "Blue": No, could not recall Recall: "Bed": No, could not recall BIMS Summary Score: 4 Sensation Sensation Light Touch: Appears Intact Coordination Gross Motor Movements are Fluid and Coordinated: No Fine Motor Movements are Fluid and Coordinated: No Motor  Motor Motor: Abnormal postural alignment and control Motor - Skilled Clinical Observations: Decreased smoothness and accuracy secondary to generalized weakness  Trunk/Postural Assessment  Cervical Assessment Cervical Assessment: Within Functional Limits Thoracic Assessment Thoracic Assessment: Exceptions to [facility] (rounded shoudlers) Lumbar Assessment Lumbar Assessment: Exceptions to [facility] (posterior pelvic tilt) Postural Control Postural Control: Deficits on evaluation Righting Reactions: delayed and inadequate Protective Responses: decreased Postural Limitations: decreased  Balance Balance Balance Assessed: Yes Static Sitting Balance Static Sitting - Balance Support: Feet supported Static Sitting - Level of Assistance: 5: Stand by  assistance (SUP) Dynamic Sitting Balance Dynamic Sitting - Balance Support: Feet supported Dynamic Sitting - Level of Assistance: 5: Stand by assistance (SUP-CGA) Dynamic Sitting - Balance Activities: Lateral lean/weight shifting;Forward lean/weight shifting Static Standing Balance Static Standing - Balance Support: During functional activity;Bilateral upper extremity supported Static Standing - Level of Assistance: 3: Mod assist;4: Min assist Dynamic Standing Balance Dynamic Standing - Balance Support: During functional activity;Bilateral upper extremity supported Dynamic Standing - Level of Assistance: 3: Mod assist Dynamic Standing - Balance Activities: Lateral lean/weight shifting;Reaching for objects Extremity/Trunk Assessment RUE Assessment RUE Assessment: Exceptions to [facility] Active Range of Motion (AROM) Comments: WFL General Strength Comments: 3-/5 LUE Assessment LUE Assessment: Exceptions to [facility] Active Range of Motion (AROM) Comments: WFL General Strength Comments: 3-/5  Care Tool Care Tool Self Care Eating   Eating Assist Level: Set up assist    Oral Care    Oral Care Assist Level: Supervision/Verbal cueing    Bathing   Body parts bathed by patient: Right arm;Chest;Left arm;Abdomen;Right upper leg;Left upper leg;Right lower leg;Left lower leg;Face Body parts bathed by helper: Front perineal area;Buttocks   Assist Level: Minimal Assistance - Patient > 75%    Upper Body Dressing(including orthotics)   What is the patient wearing?: Bra;Pull over shirt   Assist Level: Set up assist    Lower Body Dressing (excluding footwear)   What is the patient wearing?: Underwear/pull up;Pants Assist for lower body dressing: Maximal Assistance - Patient 25 - 49%    Putting on/Taking off footwear   What is the patient wearing?: Ted hose;Shoes Assist for footwear: Maximal Assistance - Patient 25 - 49%       Care Tool Toileting Toileting activity   Assist for toileting:  Dependent - Patient 0%     Care Tool Bed Mobility Roll left and right activity   Roll left and right assist level: Contact Guard/Touching assist    Sit to lying activity   Sit to lying assist level: 2 Helpers    Lying to sitting on side of bed activity   Lying to sitting on side of bed assist level: the ability to move from lying on the back to sitting on the side of the bed with no back support.: 2 Helpers     Care Tool Transfers Sit to stand transfer   Sit to stand assist level: 2 Helpers    Chair/bed transfer   Chair/bed transfer assist level: 2 Software engineer transfer  Assist Level: 2 Helpers     Care Tool Cognition  Expression of Ideas and Wants Expression of Ideas and Wants: 3. Some difficulty - exhibits some difficulty with expressing needs and ideas (e.g, some words or finishing thoughts) or speech is not clear  Understanding Verbal and Non-Verbal Content Understanding Verbal and Non-Verbal Content: 2. Sometimes understands - understands only basic conversations or simple, direct phrases. Frequently requires cues to understand   Memory/Recall Ability Memory/Recall Ability : None of the above were recalled   Refer to Care Plan for Long Term Goals  SHORT TERM GOAL WEEK 1 OT Short Term Goal 1 (Week 1): Pt will perform toilet transfer with consistent Min A + LRAD. OT Short Term Goal 2 (Week 1): Pt will thread LB garments with Min A + LRAD. OT Short Term Goal 3 (Week 1): Pt will complete 3/3 toileting activities with Mod A + LRAD.  Recommendations for other services: None    Skilled Therapeutic Intervention  Session 1: Session began with introduction to OT role, OT POC, and general orientation to rehab unit/schedule. Pt completes full-body sponge-bathing with levels of assistance noted below. Stand-pivot from EOB>recliner with Mod A + RW, increased assistance to avoid premature sitting. Pt remained sitting in recliner, posey belt activity, call bell within  reach.  ADL ADL Eating: Set up Where Assessed-Eating: Bed level Upper Body Bathing: Supervision/safety;Setup Where Assessed-Upper Body Bathing: Edge of bed Lower Body Bathing: Minimal assistance;Moderate assistance Upper Body Dressing: Supervision/safety;Setup Where Assessed-Upper Body Dressing: Edge of bed Lower Body Dressing: Minimal assistance;Moderate assistance Where Assessed-Lower Body Dressing: Edge of bed Toileting: Maximal assistance Where Assessed-Toileting: Bedside Commode Toilet Transfer: Moderate assistance Toilet Transfer Method: Stand pivot Acupuncturist: Bedside commode Tub/Shower Transfer: Unable to assess Film/video editor Method: Unable to assess Mobility  Bed Mobility Bed Mobility: Sit to Supine;Supine to Sit Supine to Sit: Moderate Assistance - Patient 50-74% Sit to Supine: Moderate Assistance - Patient 50-74% Transfers Sit to Stand: Moderate Assistance - Patient 50-74% Stand to Sit: Moderate Assistance - Patient 50-74%  Session 2: Pt received resting in bed, spouse and son present in room. Pt with increased pain at fracture and pre-medicated. Pt comes to EOB with Mod A + hospital bed features. Pt receptive to light activity, performing x5 sit<>stands with Min A + RW from elevated bed-heights. Pt requires increased encouragement to static stand for BLE strengthening, standing up to 10 secs with CGA + RW. Lateral steps attempted, but unable to progress this session due to pain. VSS stable throughout, although patient does complain of increased dizziness. Pt remained resting in bed with all immediate needs met.   Discharge Criteria: Patient will be discharged from OT if patient refuses treatment 3 consecutive times without medical reason, if treatment goals not met, if there is a change in medical status, if patient makes no progress towards goals or if patient is discharged from hospital.  The above assessment, treatment plan, treatment  alternatives and goals were discussed and mutually agreed upon: by patient  Artemus Biles, OTR/L, MSOT  10/23/2023, 9:10 AM

## 2023-10-23 NOTE — Evaluation (Signed)
 Physical Therapy Assessment and Plan  Patient Details  Name: Rachel Clark MRN: [id number] Date of Birth: [birth year]  PT Diagnosis: Abnormality of gait, Difficulty walking, Muscle weakness, and Pain in L hip Rehab Potential: Good ELOS: 2-3 weeks   Today's Date: 10/23/2023 PT Individual Time: 0865-7846 + [id number] PT Individual Time Calculation (min): 55 min + 42 min  Hospital Problem: Principal Problem:   Pelvic fracture [facility])   Past Medical History:  Past Medical History:  Diagnosis Date   Arthritis    "fingers" (06/29/2016)   Chronic lower back pain    Colon cancer (HCC) 12/04/2011   s/p Laparoscopic-assisted transverse colectomy on 12/19/2011 by Dr. Delane Fear.  pT3 N0 M0.    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   last seizure was in 1972 ;takes Tegretol  (06/29/2016)    Headache    "weekly" (06/29/2016)   Hyperlipidemia    Hyponatremia    Hypothyroid    Iron deficiency anemia 11/17/2011   Memory change 11/13/2016   Monoclonal gammopathy of unknown significance    Orthostatic hypotension    Presence of permanent cardiac pacemaker    Syncope and collapse    pacemaker implanted   Past Surgical History:  Past Surgical History:  Procedure Laterality Date   ANTERIOR CERVICAL DECOMP/DISCECTOMY FUSION  1980's   BACK SURGERY     CARDIAC CATHETERIZATION  05/20/2009   obstructive native vessel disease in LAD, RCA, and first diagonal, patent vein graft to distal RCA and LIMA to LAD,normal. ef 60%   CARDIAC CATHETERIZATION N/A 03/24/2015   Procedure: Left Heart Cath and Cors/Grafts Angiography;  Surgeon: Wenona Hamilton, MD;  Location: [facility] INVASIVE CV LAB;  Service: Cardiovascular;  Laterality: N/A;   CARDIAC CATHETERIZATION N/A 07/28/2015   Procedure: Left Heart Cath  and Coronary Angiography;  Surgeon: Millicent Ally, MD;  Location: [facility] CV LAB;  Service: Cardiovascular;  Laterality: N/A;   CARDIAC CATHETERIZATION N/A 11/09/2015   Procedure: Coronary Stent Intervention;  Surgeon: Arnoldo Lapping, MD;  Location: [facility] INVASIVE CV LAB;  Service: Cardiovascular;  Laterality: N/A;   CARDIAC CATHETERIZATION N/A 11/09/2015   Procedure: Left Heart Cath and Coronary Angiography;  Surgeon: Arnoldo Lapping, MD;  Location: [facility] INVASIVE CV LAB;  Service: Cardiovascular;  Laterality: N/A;   CARDIAC CATHETERIZATION N/A 06/29/2016   Procedure: Left Heart Cath and Coronary Angiography;  Surgeon: Sammy Crisp, MD;  Location: [facility] INVASIVE CV LAB;  Service: Cardiovascular;  Laterality: N/A;   CARDIAC CATHETERIZATION N/A 06/29/2016   Procedure: Intravascular Pressure Wire/FFR Study;  Surgeon: Sammy Crisp, MD;  Location: [facility] INVASIVE CV LAB;  Service: Cardiovascular;  Laterality: N/A;   CARDIAC CATHETERIZATION N/A 06/29/2016   Procedure: Coronary Balloon Angioplasty;  Surgeon: Sammy Crisp, MD;  Location: [facility];  Service: Cardiovascular;  Laterality: N/A;   COLON RESECTION  12/19/2011   Procedure: COLON RESECTION LAPAROSCOPIC;  Surgeon: Enid Harry, MD;  Location[facility] OR;  Service: General;  Laterality: N/A;  laparoscopic hand assisted partial colon resection   COLON SURGERY     COLONOSCOPY     COLONOSCOPY WITH PROPOFOL N/A 07/28/2023   Procedure: COLONOSCOPY WITH PROPOFOL;  Surgeon: Felecia Hopper, MD;  Location: [facility] ENDOSCOPY;  Service: Gastroenterology;  Laterality: N/A;   CORONARY  ANGIOPLASTY WITH STENT PLACEMENT  ~ 2007   1 stent   CORONARY ARTERY BYPASS GRAFT  2007   "CABG X2"   DILATION AND CURETTAGE OF UTERUS  1973   EP IMPLANTABLE DEVICE N/A 03/25/2015   MDT Advisa DR pacemaker implanted by Dr Nunzio Belch for transient complete heart block and syncope   ESOPHAGOGASTRODUODENOSCOPY (EGD) WITH PROPOFOL  N/A 03/14/2021   Procedure:  ESOPHAGOGASTRODUODENOSCOPY (EGD) WITH PROPOFOL ;  Surgeon: Genell Ken, MD;  Location: [facility] ENDOSCOPY;  Service: Gastroenterology;  Laterality: N/A;   ESOPHAGOGASTRODUODENOSCOPY (EGD) WITH PROPOFOL  N/A 11/13/2021   Procedure: ESOPHAGOGASTRODUODENOSCOPY (EGD) WITH PROPOFOL ;  Surgeon: Felecia Hopper, MD;  Location: [facility] ENDOSCOPY;  Service: Gastroenterology;  Laterality: N/A;   ESOPHAGOGASTRODUODENOSCOPY (EGD) WITH PROPOFOL  N/A 02/07/2023   Procedure: ESOPHAGOGASTRODUODENOSCOPY (EGD) WITH PROPOFOL ;  Surgeon: Felecia Hopper, MD;  Location: [facility] ENDOSCOPY;  Service: Gastroenterology;  Laterality: N/A;   HEMOSTASIS CLIP PLACEMENT  03/14/2021   Procedure: HEMOSTASIS CLIP PLACEMENT;  Surgeon: Genell Ken, MD;  Location: [facility] ENDOSCOPY;  Service: Gastroenterology;;   HEMOSTASIS CLIP PLACEMENT  07/28/2023   Procedure: HEMOSTASIS CLIP PLACEMENT;  Surgeon: Felecia Hopper, MD;  Location: [facility] ENDOSCOPY;  Service: Gastroenterology;;   HOT HEMOSTASIS N/A 07/28/2023   Procedure: HOT HEMOSTASIS (ARGON PLASMA COAGULATION/BICAP);  Surgeon: Felecia Hopper, MD;  Location: [facility] ENDOSCOPY;  Service: Gastroenterology;  Laterality: N/A;   INSERT / REPLACE / REMOVE PACEMAKER     LEFT HEART CATH AND CORONARY ANGIOGRAPHY N/A 11/11/2021   Procedure: LEFT HEART CATH AND CORONARY ANGIOGRAPHY;  Surgeon: Arleen Lacer, MD;  Location: [facility] INVASIVE CV LAB;  Service: Cardiovascular;  Laterality: N/A;   LEFT HEART CATH AND CORS/GRAFTS ANGIOGRAPHY N/A 02/21/2018   Procedure: LEFT HEART CATH AND CORS/GRAFTS ANGIOGRAPHY;  Surgeon: Avanell Leigh, MD;  Location: [facility];  Service: Cardiovascular;  Laterality: N/A;   LEFT HEART CATH AND CORS/GRAFTS ANGIOGRAPHY N/A 03/08/2021   Procedure: LEFT HEART CATH AND CORS/GRAFTS ANGIOGRAPHY;  Surgeon: Swazil[place], Peter M, MD;  Location: [facility] INVASIVE CV LAB;  Service: Cardiovascular;  Laterality: N/A;   PORT-A-CATH REMOVAL N/A 06/12/2013   Procedure: REMOVAL PORT-A-CATH;  Surgeon: Enid Harry, MD;  Location: [facility];  Service: General;  Laterality: N/A;   PORTACATH PLACEMENT  06/04/2012   Procedure: INSERTION PORT-A-CATH;  Surgeon: Enid Harry, MD;  Location: [facility] OR;  Service: General;  Laterality: N/A;  Insertion of port-a-cath    POSTERIOR LAMINECTOMY / DECOMPRESSION CERVICAL SPINE  1990s   SCLEROTHERAPY  03/14/2021   Procedure: SCLEROTHERAPY;  Surgeon: Genell Ken, MD;  Location: [facility] ENDOSCOPY;  Service: Gastroenterology;;   VAGINAL HYSTERECTOMY      Assessment & Plan Clinical Impression: Patient is a 73 year old right-handed female with history of dementia maintained on [place] , hypothyroidism, complete heart block status post permanent pacemaker as well as history of CABG with stents followed by cardiology service Dr. [place] maintained on low-dose aspirin , hypertension, history of GI bleed/gastric ulcer, epilepsy maintained on Tegretol , chronic anemia followed by hematology services Dr. Marton Sleeper as well as colon cancer. Per chart review lives with husband and son. Independent mobility but needed assistance due to dementia and safety awareness. She was rather sedentary prior to admission. Presented 10/17/2023 after fall. No report of loss of consciousness. Admission chemistries unremarkable except glucose 119, hemoglobin 8.9, WBC 13,500. Cranial CT scan negative for acute changes. 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 CT cervical spine negative. X-rays and CT imaging of the pelvis showed comminuted minimally displaced fracture of the left pubic body with extension along  the left superior pubic ramus. Nondisplaced fracture of the right sacral ala. Follow-up orthopedic service Dr. Kevan Peers nonoperative weightbearing as tolerated. Hospital course in regards to patient's acute on chronic anemia she was receiving iron  infusions as an outpatient which was administered 10/22/2023 while in the hospital. Hemoglobin ranges from 7.5-8.7. She was  cleared to continue her low-dose aspirin  therapy. Lovenox  added for DVT prophylaxis 10/19/2023. He did have some hyponatremia 130-134 and was placed on sodium chloride  tablets 1 g 3 times daily 10/21/2023. Patient on Effient  as well as Repatha  prior to admission remains on hold. Therapy evaluations completed due to patient's decreased functional mobility was admitted for a comprehensive rehab program.   Patient currently requires mod with mobility secondary to muscle weakness, decreased cardiorespiratoy endurance, decreased motor planning, decreased initiation, decreased awareness, decreased problem solving, decreased safety awareness, and decreased memory, and decreased standing balance, decreased postural control, and decreased balance strategies.  Prior to hospitalization, patient was independent  with mobility and lived with Spouse (Husband Royston Cornea is primary caregiver, also caregiver for their adult son) in a House home.  Home access is 2 to enter laundry room, then 2 more to enter living room.Stairs to enter.  Patient will benefit from skilled PT intervention to maximize safe functional mobility, minimize fall risk, and decrease caregiver burden for planned discharge home with 24 hour supervision.  Anticipate patient will benefit from follow up HH at discharge.  PT - End of Session Activity Tolerance: Tolerates 30+ min activity with multiple rests Endurance Deficit: Yes Endurance Deficit Description: rest breaks with all mobility PT Assessment Rehab Potential (ACUTE/IP ONLY): Good PT Barriers to Discharge: Inaccessible home environment;Decreased caregiver support;Home environment access/layout;Incontinence PT Barriers to Discharge Comments: pt husband unable to provide physical assist, per husband pt demonstrating new incontinence, pt has 2 STE home PT Patient demonstrates impairments in the following area(s): Balance;Endurance;Nutrition;Behavior;Pain;Perception;Safety;Sensory PT Transfers  Functional Problem(s): Bed Mobility;Bed to Chair;Car PT Locomotion Functional Problem(s): Ambulation;Wheelchair Mobility;Stairs PT Plan PT Intensity: Minimum of 1-2 x/day ,45 to 90 minutes PT Frequency: 5 out of 7 days PT Duration Estimated Length of Stay: 2-3 weeks PT Treatment/Interventions: Ambulation/gait training;Discharge planning;Functional mobility training;Psychosocial support;Therapeutic Activities;Visual/perceptual remediation/compensation;Balance/vestibular training;Disease management/prevention;Neuromuscular re-education;Therapeutic Exercise;Wheelchair propulsion/positioning;Cognitive remediation/compensation;DME/adaptive equipment instruction;Pain management;UE/LE Strength taining/ROM;Community reintegration;Patient/family education;Stair training;UE/LE Coordination activities PT Transfers Anticipated Outcome(s): supervision PT Locomotion Anticipated Outcome(s): supervision ambulatory PT Recommendation Recommendations for Other Services: None Follow Up Recommendations: Home health PT Patient destination: Home Equipment Recommended: Rolling walker with 5" wheels   PT Evaluation Precautions/Restrictions Precautions Precautions: Fall Recall of Precautions/Restrictions: Impaired Precaution/Restrictions Comments: WBAT, Dementia, PPM (watch BP) Restrictions Weight Bearing Restrictions Per Provider Order: Yes LLE Weight Bearing Per Provider Order: Weight bearing as tolerated Pain Interference Pain Interference Pain Effect on Sleep: 1. Rarely or not at all Pain Interference with Therapy Activities: 3. Frequently Pain Interference with Day-to-Day Activities: 2. Occasionally Home Living/Prior Functioning Home Living Available Help at Discharge: Family;Available 24 hours/day Type of Home: House Home Access: Stairs to enter [employer] of Steps: 2 to enter laundry room, then 2 more to enter living room. Entrance Stairs-Rails: Left Home Layout: One level Bathroom  Shower/Tub: Health visitor: Handicapped height Bathroom Accessibility: Yes Additional Comments: Information gathered from chart review as patient is a questionable historian, plan to verify with spouse.  Lives With: Spouse (Husband Royston Cornea is primary caregiver, also caregiver for their adult son) Prior Function Level of Independence: Independent with gait;Independent with transfers;Needs assistance with homemaking  Able to Take Stairs?: Yes Driving: No Vocation: Retired IT consultant Overall  Cognitive Status: History of cognitive impairments - at baseline Arousal/Alertness: Awake/alert Memory: Impaired Memory Impairment: Retrieval deficit Awareness: Impaired Problem Solving: Impaired Safety/Judgment: Impaired Sensation Sensation Light Touch: Appears Intact Hot/Cold: Appears Intact Proprioception: Not tested Stereognosis: Not tested Coordination Gross Motor Movements are Fluid and Coordinated: No Fine Motor Movements are Fluid and Coordinated: No Coordination and Movement Description: Decreased smoothness and accuracy secondary to generalized weakness. Motor  Motor Motor: Abnormal postural alignment and control Motor - Skilled Clinical Observations: Decreased smoothness and accuracy secondary to generalized weakness  Trunk/Postural Assessment  Cervical Assessment Cervical Assessment: Within Functional Limits Thoracic Assessment Thoracic Assessment: Exceptions to [facility] (rounded shoudlers) Lumbar Assessment Lumbar Assessment: Exceptions to [facility] (posterior pelvic tilt) Postural Control Postural Control: Deficits on evaluation Righting Reactions: delayed and inadequate Protective Responses: decreased Postural Limitations: decreased  Balance Balance Balance Assessed: Yes Static Sitting Balance Static Sitting - Balance Support: Feet supported Static Sitting - Level of Assistance: 5: Stand by assistance (SUP) Dynamic Sitting Balance Dynamic Sitting - Balance Support:  Feet supported Dynamic Sitting - Level of Assistance: 5: Stand by assistance (SUP-CGA) Dynamic Sitting - Balance Activities: Lateral lean/weight shifting;Forward lean/weight shifting Static Standing Balance Static Standing - Balance Support: During functional activity;Bilateral upper extremity supported Static Standing - Level of Assistance: 3: Mod assist;4: Min assist Dynamic Standing Balance Dynamic Standing - Balance Support: During functional activity;Bilateral upper extremity supported Dynamic Standing - Level of Assistance: 3: Mod assist Dynamic Standing - Balance Activities: Lateral lean/weight shifting;Reaching for objects Extremity Assessment  RUE Assessment RUE Assessment: Exceptions to [facility] Active Range of Motion (AROM) Comments: WFL General Strength Comments: 3-/5 LUE Assessment LUE Assessment: Exceptions to [facility] Active Range of Motion (AROM) Comments: WFL General Strength Comments: 3-/5 RLE Assessment RLE Assessment: Exceptions to [facility] General Strength Comments: grossly 4/5 tested in sitting LLE Assessment LLE Assessment: Exceptions to [facility] General Strength Comments: tested in sitting LLE Strength Left Hip Flexion: 2+/5 Left Hip Extension: 3/5 Left Knee Flexion: 3+/5 Left Knee Extension: 4/5 Left Ankle Dorsiflexion: 4/5 Left Ankle Plantar Flexion: 3/5  Care Tool Care Tool Bed Mobility Roll left and right activity   Roll left and right assist level: 2 Helpers    Sit to lying activity   Sit to lying assist level: Maximal Assistance - Patient 25 - 49%    Lying to sitting on side of bed activity   Lying to sitting on side of bed assist level: the ability to move from lying on the back to sitting on the side of the bed with no back support.: Maximal Assistance - Patient 25 - 49%     Care Tool Transfers Sit to stand transfer   Sit to stand assist level: Moderate Assistance - Patient 50 - 74%    Chair/bed transfer   Chair/bed transfer assist level: Moderate  Assistance - Patient 50 - 74%    Car transfer   Car transfer assist level: Maximal Assistance - Patient 25 - 49%      Care Tool Locomotion Ambulation Ambulation activity did not occur: Safety/medical concerns        Walk 10 feet activity Walk 10 feet activity did not occur: Safety/medical concerns       Walk 50 feet with 2 turns activity Walk 50 feet with 2 turns activity did not occur: Safety/medical concerns      Walk 150 feet activity Walk 150 feet activity did not occur: Safety/medical concerns      Walk 10 feet on uneven surfaces activity Walk 10 feet on uneven  surfaces activity did not occur: Safety/medical concerns      Stairs Stair activity did not occur: Safety/medical concerns        Walk up/down 1 step activity Walk up/down 1 step or curb (drop down) activity did not occur: Safety/medical concerns      Walk up/down 4 steps activity Walk up/down 4 steps activity did not occur: Safety/medical concerns      Walk up/down 12 steps activity Walk up/down 12 steps activity did not occur: Safety/medical concerns      Pick up small objects from floor Pick up small object from the floor (from standing position) activity did not occur: Safety/medical concerns      Wheelchair Is the patient using a wheelchair?: Yes Type of Wheelchair: Manual   Wheelchair assist level: Dependent - Patient 0%    Wheel 50 feet with 2 turns activity   Assist Level: Dependent - Patient 0%  Wheel 150 feet activity   Assist Level: Dependent - Patient 0%    Refer to Care Plan for Long Term Goals  SHORT TERM GOAL WEEK 1 PT Short Term Goal 1 (Week 1): Pt will complete bed mobility with min assist PT Short Term Goal 2 (Week 1): Pt will complete bed to [facility] transfers with min assist and LRAD PT Short Term Goal 3 (Week 1): Pt will ambulates with RW with mod assist 50 with +2 WC follow PT Short Term Goal 4 (Week 1): Pt will complete up/down 1 6" step with BHRs  Recommendations for other  services: None   Skilled Therapeutic Intervention SESSION 1: Evaluation completed (see details above and below) with education on PT POC and goals and individual treatment initiated with focus on transfer training, tolerance to upright, WC management. Pt denies pain in L hip at rest but perseverative on pain with mobility. Pt requires frequent reorientation to situation during session due to pt with poor memory unable to recall pelvic fracture or events leading up to fracture. Pt pleasantly confused throughout session. Pt completes sit<>stand transfer with heavy mod assist to RW, completes stand step transfer with mod assist recliner<>WC with RW, requires mod assist with Stedy back to bed due to R calf pain and L hip pain. Pt prompted to trial gait training however pt reporting unable to tolerate upright and unable to advance LLE secondary to pain. Pt reports pain in R calf at end of session, assessed and noted to be hypersensitive to light tough, negative for warmth or redness. Pt returned to room and assisted back to bed with max assist for BLE management and repositioning once supine. Pt remains supine in bed and prepped for BLE doppler study with tech present at end of session.  SESSION 2: Pt presents in room in bed asleep and slow to awaken. Pt initially agreeable to PT denying pain in supine however with decreased motivation to participate once positioned on EOB due to pt reporting pain in L hip, premedicated. Pt noted to be incontinent of brief with bed linens soiled and requires significant time and encouragement to stand with mod/max assist to stedy for completing periarea hygiene and managing pants. Pt able to maintain upright perched position for ~6 minutes, maintains seated EOB with min assist for maintaining upright posture as pt continuously attempting to return to supine. Pt able to complete minimal periarea hygiene in standing with max cues for attendance to task, maintains standing ~20  seconds with LUE support on stedy, therapist offers to assist for thoroughness however pt refuses. Pt requires  x3 stands to attempt pulling up brief and pants as pt with decreased tolerance to upright and weightbearing. Therapist able to manage changing sheets partially with pt in perched position in stedy however pt reporting nausea and fatigue in upright position and assisted back to supine with max assist. Pt requires 2 person assist for rolling for maintaining attention to task, therapist providing skilled cues to improve rolling mechanics and use of hospital bed features while sheets and chuck pad managed. Pt remains supine with all needs within reach, call light in place, bed alarm activated and pt husband at bedside at end of session.    Mobility Bed Mobility Bed Mobility: Sit to Supine;Supine to Sit Supine to Sit: Moderate Assistance - Patient 50-74% Sit to Supine: Moderate Assistance - Patient 50-74% Transfers Transfers: Sit to Stand;Stand to Sit;Stand Pivot Transfers Sit to Stand: Moderate Assistance - Patient 50-74% Stand to Sit: Moderate Assistance - Patient 50-74% Stand Pivot Transfers: Moderate Assistance - Patient 50 - 74% Stand Pivot Transfer Details: Verbal cues for safe use of DME/AE;Verbal cues for technique;Verbal cues for precautions/safety;Verbal cues for gait pattern Transfer (Assistive device): Rolling walker Locomotion  Gait Ambulation: No Gait Gait: No Stairs / Additional Locomotion Stairs: No Wheelchair Mobility Wheelchair Mobility: No   Discharge Criteria: Patient will be discharged from PT if patient refuses treatment 3 consecutive times without medical reason, if treatment goals not met, if there is a change in medical status, if patient makes no progress towards goals or if patient is discharged from hospital.  The above assessment, treatment plan, treatment alternatives and goals were discussed and mutually agreed upon: by patient  Annia Kilts PT,  DPT 10/23/2023, 5:22 PM

## 2023-10-23 NOTE — Plan of Care (Signed)
  Problem: RH Balance Goal: LTG Patient will maintain dynamic standing balance (PT) Description: LTG:  Patient will maintain dynamic standing balance with assistance during mobility activities (PT) Flowsheets (Taken 10/23/2023 1723) LTG: Pt will maintain dynamic standing balance during mobility activities with:: Supervision/Verbal cueing   Problem: Sit to Stand Goal: LTG:  Patient will perform sit to stand with assistance level (PT) Description: LTG:  Patient will perform sit to stand with assistance level (PT) Flowsheets (Taken 10/23/2023 1723) LTG: PT will perform sit to stand in preparation for functional mobility with assistance level: Supervision/Verbal cueing   Problem: RH Bed Mobility Goal: LTG Patient will perform bed mobility with assist (PT) Description: LTG: Patient will perform bed mobility with assistance, with/without cues (PT). Flowsheets (Taken 10/23/2023 1723) LTG: Pt will perform bed mobility with assistance level of: Supervision/Verbal cueing   Problem: RH Bed to Chair Transfers Goal: LTG Patient will perform bed/chair transfers w/assist (PT) Description: LTG: Patient will perform bed to chair transfers with assistance (PT). Flowsheets (Taken 10/23/2023 1723) LTG: Pt will perform Bed to Chair Transfers with assistance level: Supervision/Verbal cueing   Problem: RH Car Transfers Goal: LTG Patient will perform car transfers with assist (PT) Description: LTG: Patient will perform car transfers with assistance (PT). Flowsheets (Taken 10/23/2023 1723) LTG: Pt will perform car transfers with assist:: Supervision/Verbal cueing   Problem: RH Ambulation Goal: LTG Patient will ambulate in controlled environment (PT) Description: LTG: Patient will ambulate in a controlled environment, # of feet with assistance (PT). Flowsheets (Taken 10/23/2023 1723) LTG: Pt will ambulate in controlled environ  assist needed:: Supervision/Verbal cueing LTG: Ambulation distance in controlled  environment: 150' Goal: LTG Patient will ambulate in home environment (PT) Description: LTG: Patient will ambulate in home environment, # of feet with assistance (PT). Flowsheets (Taken 10/23/2023 1723) LTG: Pt will ambulate in home environ  assist needed:: Supervision/Verbal cueing LTG: Ambulation distance in home environment: 50'   Problem: RH Stairs Goal: LTG Patient will ambulate up and down stairs w/assist (PT) Description: LTG: Patient will ambulate up and down # of stairs with assistance (PT) Flowsheets (Taken 10/23/2023 1723) LTG: Pt will ambulate up/down stairs assist needed:: Supervision/Verbal cueing LTG: Pt will  ambulate up and down number of stairs: 3 steps per home set up

## 2023-10-23 NOTE — Progress Notes (Signed)
 Met with patient to review current situation, team conference and plan of care. Patient very pleasant but disoriented to place, time and situation. Will revisit  when family is present. Continue to follow along to provide educational needs to facilitate preparation for discharge.

## 2023-10-23 NOTE — Progress Notes (Signed)
 Inpatient Rehabilitation  Patient information reviewed and entered into eRehab system by Feliberto Gottron, M.A., CCC-SLP, Rehab Quality Coordinator.  Information including medical coding, functional ability and quality indicators will be reviewed and updated through discharge.

## 2023-10-23 NOTE — Progress Notes (Addendum)
 PROGRESS NOTE   Subjective/Complaints: No new concerns this AM. No acute events . She reports pain is under control.   LBM 4/21  ROS: Patient denies fever, new vision changes, dizziness, nausea, vomiting, diarrhea,  shortness of breath or chest pain, headache, or mood change.    Objective:   No results found. Recent Labs    10/21/23 1543 10/23/23 0520  WBC 8.9 8.5  HGB 7.5* 8.3*  HCT 23.5* 25.7*  PLT 286 365   Recent Labs    10/22/23 1021 10/23/23 0520  NA 133* 132*  K 4.3 4.0  CL 103 103  CO2 21* 21*  GLUCOSE 95 114*  BUN 17 17  CREATININE 0.67 0.71  CALCIUM  8.5* 8.3*   No intake or output data in the 24 hours ending 10/23/23 0848      Physical Exam: Vital Signs Blood pressure (!) 151/69, pulse 90, temperature 98.7 F (37.1 C), resp. rate 16, SpO2 97%.    General: No apparent distress HEENT: Head is normocephalic, atraumatic, sclera anicteric, oral mucosa pink and moist Neck: Supple without JVD or lymphadenopathy Heart: Reg rate and rhythm. No murmurs rubs or gallops Chest: CTA bilaterally without wheezes, rales, or rhonchi; no distress Abdomen: Soft, non-tender, non-distended, bowel sounds positive.  Psych: Pt's affect is appropriate. Pt is cooperative Skin: Clean and intact without signs of breakdown  Musculoskeletal:      General: Swelling and tenderness present.     Cervical back: Normal range of motion and neck supple.  Skin:    General: Skin is warm.     Findings: Bruising present.  Neuro:      Comments: Pt is awake and alert. Oriented to person and hospital, not year/month/day. Follows basic commands. Answers biographical questions, some times very matter-of-factly. Fair insight and awareness,  CN exam non-focal. MMT: 4+ to 5/5 bilateral upper ext. RLE 3/5 HF, KE and 4/5 ADF/PF> LLE 2-/5 HF, KE (pain component) and 4/5 APF/ADF. Sensory exam normal for light touch and pain in all 4 limbs. No  limb ataxia or cerebellar signs. No abnormal tone appreciated.    Psychiatric:     Comments: Pt a bit dry but pleasant and cooperative.      Assessment/Plan: 1. Functional deficits which require 3+ hours per day of interdisciplinary therapy in a comprehensive inpatient rehab setting. Physiatrist is providing close team supervision and 24 hour management of active medical problems listed below. Physiatrist and rehab team continue to assess barriers to discharge/monitor patient progress toward functional and medical goals  Care Tool:  Bathing              Bathing assist       Upper Body Dressing/Undressing Upper body dressing        Upper body assist      Lower Body Dressing/Undressing Lower body dressing            Lower body assist       Toileting Toileting    Toileting assist Assist for toileting: Dependent - Patient 0%     Transfers Chair/bed transfer  Transfers assist     Chair/bed transfer assist level: 2 Helpers     Locomotion Ambulation  Ambulation assist              Walk 10 feet activity   Assist           Walk 50 feet activity   Assist           Walk 150 feet activity   Assist           Walk 10 feet on uneven surface  activity   Assist           Wheelchair     Assist               Wheelchair 50 feet with 2 turns activity    Assist            Wheelchair 150 feet activity     Assist          Blood pressure (!) 151/69, pulse 90, temperature 98.7 F (37.1 C), resp. rate 16, SpO2 97%.  Medical Problem List and Plan: 1. Functional deficits secondary to left sacral ala and superior pubic rami fracture.  Weightbearing as tolerated per Dr. Guyann Leitz             -patient may shower             -ELOS/Goals: 10-14 days, supervision goals with PT, and sup/min assist with OT and SLP- order placed  -Team conference note placed 2.  Antithrombotics: -DVT/anticoagulation:   Pharmaceutical: Lovenox .  Check vascular study             -antiplatelet therapy: Aspirin  81 mg daily 3. Pain Management: Lidoderm  patch, oxycodone  as needed 4. Mood/Behavior/Sleep: Namenda  10 mg twice daily, Cymbalta  60 mg twice daily             -antipsychotic agents: N/A 5. Neuropsych/cognition: This patient is not quite capable of making decisions on her own behalf. 6. Skin/Wound Care: Routine skin checks 7. Fluids/Electrolytes/Nutrition: Routine in and outs with follow-up chemistries on admission 8.  Acute on chronic anemia/history of GI bleed/colon cancer.  Patient receives outpatient iron  infusions.  Continue iron  supplement.  Follow-up per hematology services Dr.Gorsuch  -4/22 HGB stable 8.3 9.  Epilepsy.  Tegretol  600 mg twice daily as well as zarontin  500 mg twice daily. 10.  CAD/CABG/history of stents/PPM.  Continue low-dose aspirin .  Follow-up cardiology service Dr. [place].  Continue Imdur  15 mg daily. Denies CP 11.  Hypothyroidism.  Synthroid  12.  Hyponatremia.  Sodium chloride  tablets 1 g 3 times daily.  Follow-up chemistries tomorrow  -4/22 stable at 132 13.Hypertension.Norvasc  5 mg daily.Monitor with increased mobility             -bp borderline at present.  4/22 increase norvasc  to 7.5     10/23/2023    5:54 AM 10/22/2023    8:28 PM 10/22/2023    6:58 PM  Vitals with BMI  Systolic 151 148 409  Diastolic 69 59 64  Pulse 90 85 84       LOS: 1 days A FACE TO FACE EVALUATION WAS PERFORMED  Rachel Clark 10/23/2023, 8:48 AM

## 2023-10-23 NOTE — Progress Notes (Signed)
 Lower extremity venous duplex completed. Please see CV Procedures for preliminary results.  Estanislao Heimlich, RVT 10/23/23 10:46 AM

## 2023-10-24 DIAGNOSIS — M797 Fibromyalgia: Secondary | ICD-10-CM

## 2023-10-24 DIAGNOSIS — I25810 Atherosclerosis of coronary artery bypass graft(s) without angina pectoris: Secondary | ICD-10-CM | POA: Diagnosis not present

## 2023-10-24 DIAGNOSIS — I10 Essential (primary) hypertension: Secondary | ICD-10-CM | POA: Diagnosis not present

## 2023-10-24 DIAGNOSIS — E871 Hypo-osmolality and hyponatremia: Secondary | ICD-10-CM | POA: Diagnosis not present

## 2023-10-24 DIAGNOSIS — S3282XD Multiple fractures of pelvis without disruption of pelvic ring, subsequent encounter for fracture with routine healing: Secondary | ICD-10-CM | POA: Diagnosis not present

## 2023-10-24 MED ORDER — GABAPENTIN 100 MG PO CAPS
100.0000 mg | ORAL_CAPSULE | Freq: Two times a day (BID) | ORAL | Status: DC
  Administered 2023-10-24 – 2023-10-26 (×5): 100 mg via ORAL
  Filled 2023-10-24 (×6): qty 1

## 2023-10-24 NOTE — Progress Notes (Signed)
 Recreational Therapy Session Note  Patient Details  Name: BRITANIE HARSHMAN MRN: [id number] Date of Birth: [birth year] Today's Date: 10/24/2023  Pain:no c/o  Pt participated in animal assisted activity bed level with supervision.  Pt's family present and participatory as well.All were excited to interact with the pet partner team discussing previous pets and visiting with [place].  All expressed appreciation for this visit.  Pasqual Farias 10/24/2023, 3:22 PM

## 2023-10-24 NOTE — Progress Notes (Signed)
 Physical Therapy Session Note  Patient Details  Name: Rachel Clark MRN: [id number] Date of Birth: Nov 01, 1950  Today's Date: 10/24/2023 PT Individual Time: 0907-0949 PT Individual Time Calculation (min): 42 min   Short Term Goals: Week 1:  PT Short Term Goal 1 (Week 1): Pt will complete bed mobility with min assist PT Short Term Goal 2 (Week 1): Pt will complete bed to [facility] transfers with min assist and LRAD PT Short Term Goal 3 (Week 1): Pt will ambulates with RW with mod assist 50 with +2 WC follow PT Short Term Goal 4 (Week 1): Pt will complete up/down 1 6" step with BHRs  Skilled Therapeutic Interventions/Progress Updates:    Pt presents in room in bed, asleep, awakens slowly. Pt oriented to self only however does require cueing to state married last name, disoriented to place, time and situation. Pt requires increased time for reorientation at start of session to improve buy in and therapeutic alliance with pt demonstrating agreeable to participate. Pt denies pain in supine but demonstrating pain with mobility bilaterally however pt expressing increased pain in RLE today with pt demonstrating hypersensitive to touch RLE. Pt vitals assessed in supine and sitting with pt demonstrating BP WNL. Pt completes bed mobility with hospital bed features, skilled cues for pt to initiate task with pt able to complete with supervision! Pt completes sit<>stand to stedy with elevated EOB with min assist, requires continuous education to ensure pt that pain is okay as pt with significant short term memory impairment and no carryover throughout session of orientation to situation. Pt comes to sitting in WC, prompted to complete WC propulsion with BLEs for BLE strengthening and endurance, able to maintain for ~3 minutes until reporting fatigue and pain in BLEs. Pt transported to day room and completes x3 sit<>stands to RW min assist with extended seated rest breaks between stands with pt forgetting during rest  break previous education on L pelvic fracture. Pt returns to room and remains seated in [facility] with all needs within reach, cal light in place and chair alarm donned and activated at end of session.   Therapy Documentation Precautions:  Precautions Precautions: Fall Recall of Precautions/Restrictions: Impaired Precaution/Restrictions Comments: WBAT, Dementia, PPM (watch BP) Restrictions Weight Bearing Restrictions Per Provider Order: Yes LLE Weight Bearing Per Provider Order: Weight bearing as tolerated  Therapy/Group: Individual Therapy  Annia Kilts PT, DPT 10/24/2023, 1:40 PM

## 2023-10-24 NOTE — Care Management (Signed)
 Inpatient Rehabilitation Center Individual Statement of Services  Patient Name:  Rachel Clark  Date:  10/24/2023  Welcome to the Inpatient Rehabilitation Center.  Our goal is to provide you with an individualized program based on your diagnosis and situation, designed to meet your specific needs.  With this comprehensive rehabilitation program, you will be expected to participate in at least 3 hours of rehabilitation therapies Monday-Friday, with modified therapy programming on the weekends.  Your rehabilitation program will include the following services:  Physical Therapy (PT), Occupational Therapy (OT), Speech Therapy (ST), 24 hour per day rehabilitation nursing, Therapeutic Recreaction (TR), Psychology, Neuropsychology, Care Coordinator, Rehabilitation Medicine, Nutrition Services, Pharmacy Services, and Other  Weekly team conferences will be held on Tuesdays to discuss your progress.  Your Inpatient Rehabilitation Care Coordinator will talk with you frequently to get your input and to update you on team discussions.  Team conferences with you and your family in attendance may also be held.  Expected length of stay: 2-3 weeks    Overall anticipated outcome: Supervision  Depending on your progress and recovery, your program may change. Your Inpatient Rehabilitation Care Coordinator will coordinate services and will keep you informed of any changes. Your Inpatient Rehabilitation Care Coordinator's name and contact numbers are listed  below.  The following services may also be recommended but are not provided by the Inpatient Rehabilitation Center:  Driving Evaluations Home Health Rehabiltiation Services Outpatient Rehabilitation Services Vocational Rehabilitation   Arrangements will be made to provide these services after discharge if needed.  Arrangements include referral to agencies that provide these services.  Your insurance has been verified to be:  Medicare A/B  Your primary  doctor is:  Michie Molnar  Pertinent information will be shared with your doctor and your insurance company.  Inpatient Rehabilitation Care Coordinator:  Kathey Pang [phone] or ([phone]  Information discussed with and copy given to patient by: Rennis Case, 10/24/2023, 2:37 PM

## 2023-10-24 NOTE — Plan of Care (Signed)
  Problem: RH BOWEL ELIMINATION Goal: RH STG MANAGE BOWEL WITH ASSISTANCE Description: STG Manage Bowel with supervision Assistance. Outcome: Progressing   Problem: RH BLADDER ELIMINATION Goal: RH STG MANAGE BLADDER WITH ASSISTANCE Description: STG Manage Bladder With supervision  Assistance Outcome: Progressing   Problem: RH SKIN INTEGRITY Goal: RH STG SKIN FREE OF INFECTION/BREAKDOWN Description: Manage skin free of infection/breakdown with supervision assistance Outcome: Progressing

## 2023-10-24 NOTE — Patient Care Conference (Signed)
 Inpatient RehabilitationTeam Conference and Plan of Care Update Date: 10/23/2023   Time: 1043 am     Patient Name: Rachel Clark      Medical Record Number: [id number]  Date of Birth: 1950-08-12 Sex: Female         Room/Bed: 4W15C/4W15C-01 Payor Info: Payor: MEDICARE / Plan: MEDICARE PART A AND B / Product Type: *No Product type* /    Admit Date/Time:  10/22/2023  3:32 PM  Primary Diagnosis:  Pelvic fracture [facility])  Hospital Problems: Principal Problem:   Pelvic fracture [facility])    Expected Discharge Date: Expected Discharge Date: 11/06/23  Team Members Present: Physician leading conference: Dr. Abelino Able Social Worker Present: Norval Been, LCSWA Nurse Present: Jerene Monks, RN PT Present: Catilin Osborn, PT OT Present: Artemus Biles, OT SLP Present: Jenney Modest, SLP     Current Status/Progress Goal Weekly Team Focus  Bowel/Bladder   patient is incontinent of b/b LBM 4/20   regain continence, though may not be possible due to patients congnitive state - dementia   offer toileting q2 hours during the day and q4 hours at night    Swallow/Nutrition/ Hydration               ADL's   Setup/supervision for UB ADLs & Mod-Max A for LB ADLs. Barriers: Increased pain, decreased activity tolerance, decreased strength.   CGA overall        Mobility   eval pending           Communication                Safety/Cognition/ Behavioral Observations               Pain   patient complains of 10/10 pelvic pain when moving patient. Patient tries to hold out to endure the pain. Nurse educated patient to inform as soon as the pain begins.   controlled pain <3/10   assess pain frequently throughout shift due to patient not complaining until pain is 10/10    Skin   skin is intact   maintain skin integrity  assess skin qshift and PRN      Discharge Planning:  TBA. Per EMR, pt will d/c to home with husband who is unable to physically lift her. SW  will confim there are no barriers to discharge.    Team Discussion: Patient was admitted post left sacral ala and superior pubic rami fracture due to fall at home. Patient limited by pain on right lower extremity, decreased activity tolerance and dementia.   Patient on target to meet rehab goals: Yes, Patient requires Setup/supervision for UB ADLs & Mod-Max A for LB ADLs.   Evals pending for transfers and mobility.   *See Care Plan and progress notes for long and short-term goals.   Revisions to Treatment Plan:  Vascular ultrasound   Teaching Needs: Safety, medications, transfers, toileting, etc.   Current Barriers to Discharge: Decreased caregiver support and Weight bearing restrictions  Possible Resolutions to Barriers: Family education     Medical Summary Current Status: Left sacral ala and superior pubic rami fracture, anemia, Hyypnatremia, HTN, epilepsy, dementia  Barriers to Discharge: Electrolyte abnormality;Medical stability;Self-care education;Symptomatic Anemia  Barriers to Discharge Comments: Left sacral ala and superior pubic rami fracture, anemia, Hyypnatremia, HTN, epilepsy, dementia, CAD, anemia Possible Resolutions to [employer] Focus: Monitor BMP and CPC, monitor BP, continue salt tabs, continue synthroid , continue Namenda , oxycodone  PRN, SLP eval   Continued Need for Acute Rehabilitation Level of Care: The patient requires  daily medical management by a physician with specialized training in physical medicine and rehabilitation for the following reasons: Direction of a multidisciplinary physical rehabilitation program to maximize functional independence : Yes Medical management of patient stability for increased activity during participation in an intensive rehabilitation regime.: Yes Analysis of laboratory values and/or radiology reports with any subsequent need for medication adjustment and/or medical intervention. : Yes   I attest that I was present, lead  the team conference, and concur with the assessment and plan of the team.   Jerene Monks 10/23/2023, 1043 am

## 2023-10-24 NOTE — Progress Notes (Addendum)
 Inpatient Rehabilitation Care Coordinator Assessment and Plan Patient Details  Name: Rachel Clark MRN: [id number] Date of Birth: 1951-03-21  Today's Date: 10/24/2023  Hospital Problems: Principal Problem:   Pelvic fracture [facility])  Past Medical History:  Past Medical History:  Diagnosis Date   Arthritis    "fingers" (06/29/2016)   Chronic lower back pain    Colon cancer (HCC) 12/04/2011   s/p Laparoscopic-assisted transverse colectomy on 12/19/2011 by Dr. Delane Fear.  pT3 N0 M0.    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   last seizure was in 1972 ;takes Tegretol  (06/29/2016)    Headache    "weekly" (06/29/2016)   Hyperlipidemia    Hyponatremia    Hypothyroid    Iron deficiency anemia 11/17/2011   Memory change 11/13/2016   Monoclonal gammopathy of unknown significance    Orthostatic hypotension    Presence of permanent cardiac pacemaker    Syncope and collapse    pacemaker implanted   Past Surgical History:  Past Surgical History:  Procedure Laterality Date   ANTERIOR CERVICAL DECOMP/DISCECTOMY FUSION  1980's   BACK SURGERY     CARDIAC CATHETERIZATION  05/20/2009   obstructive native vessel disease in LAD, RCA, and first diagonal, patent vein graft to distal RCA and LIMA to LAD,normal. ef 60%   CARDIAC CATHETERIZATION N/A 03/24/2015   Procedure: Left Heart Cath and Cors/Grafts Angiography;  Surgeon: Wenona Hamilton, MD;  Location: [facility] INVASIVE CV LAB;  Service: Cardiovascular;  Laterality: N/A;   CARDIAC CATHETERIZATION N/A 07/28/2015   Procedure: Left Heart Cath and Coronary Angiography;  Surgeon: Millicent Ally, MD;  Location: [facility] CV LAB;  Service: Cardiovascular;  Laterality: N/A;   CARDIAC CATHETERIZATION N/A 11/09/2015   Procedure: Coronary Stent  Intervention;  Surgeon: Arnoldo Lapping, MD;  Location: [facility] INVASIVE CV LAB;  Service: Cardiovascular;  Laterality: N/A;   CARDIAC CATHETERIZATION N/A 11/09/2015   Procedure: Left Heart Cath and Coronary Angiography;  Surgeon: Arnoldo Lapping, MD;  Location: [facility] INVASIVE CV LAB;  Service: Cardiovascular;  Laterality: N/A;   CARDIAC CATHETERIZATION N/A 06/29/2016   Procedure: Left Heart Cath and Coronary Angiography;  Surgeon: Sammy Crisp, MD;  Location: [facility] INVASIVE CV LAB;  Service: Cardiovascular;  Laterality: N/A;   CARDIAC CATHETERIZATION N/A 06/29/2016   Procedure: Intravascular Pressure Wire/FFR Study;  Surgeon: Sammy Crisp, MD;  Location: [facility] INVASIVE CV LAB;  Service: Cardiovascular;  Laterality: N/A;   CARDIAC CATHETERIZATION N/A 06/29/2016   Procedure: Coronary Balloon Angioplasty;  Surgeon: Sammy Crisp, MD;  Location: [facility]VAS[facility] CV LAB;  Service: Cardiovascular;  Laterality: N/A;   COLON RESECTION  12/19/2011   Procedure: COLON RESECTION LAPAROSCOPIC;  Surgeon: Enid Harry, MD;  Location: [facility] OR;  Service: General;  Laterality: N/A;  laparoscopic hand assisted partial colon resection   COLON SURGERY     COLONOSCOPY     COLONOSCOPY WITH PROPOFOL N/A 07/28/2023   Procedure: COLONOSCOPY WITH PROPOFOL;  Surgeon: Felecia Hopper, MD;  Location: [facility] ENDOSCOPY;  Service: Gastroenterology;  Laterality: N/A;   CORONARY ANGIOPLASTY WITH STENT PLACEMENT  ~ 2007   1 stent   CORONARY ARTERY BYPASS GRAFT  2007   "CABG X2"   DILATION AND CURETTAGE OF UTERUS  1973   EP IMPLANTABLE DEVICE N/A  03/25/2015   MDT Advisa DR pacemaker implanted by Dr Nunzio Belch for transient complete heart block and syncope   ESOPHAGOGASTRODUODENOSCOPY (EGD) WITH PROPOFOL N/A 03/14/2021   Procedure: ESOPHAGOGASTRODUODENOSCOPY (EGD) WITH PROPOFOL;  Surgeon: Genell Ken, MD;  Location: [facility] ENDOSCOPY;  Service: Gastroenterology;  Laterality: N/A;   ESOPHAGOGASTRODUODENOSCOPY (EGD) WITH PROPOFOL N/A 11/13/2021   Procedure:  ESOPHAGOGASTRODUODENOSCOPY (EGD) WITH PROPOFOL;  Surgeon: Felecia Hopper, MD;  Location: [facility] ENDOSCOPY;  Service: Gastroenterology;  Laterality: N/A;   ESOPHAGOGASTRODUODENOSCOPY (EGD) WITH PROPOFOL N/A 02/07/2023   Procedure: ESOPHAGOGASTRODUODENOSCOPY (EGD) WITH PROPOFOL;  Surgeon: Felecia Hopper, MD;  Location: [facility] ENDOSCOPY;  Service: Gastroenterology;  Laterality: N/A;   HEMOSTASIS CLIP PLACEMENT  03/14/2021   Procedure: HEMOSTASIS CLIP PLACEMENT;  Surgeon: Genell Ken, MD;  Location: [facility] ENDOSCOPY;  Service: Gastroenterology;;   HEMOSTASIS CLIP PLACEMENT  07/28/2023   Procedure: HEMOSTASIS CLIP PLACEMENT;  Surgeon: Felecia Hopper, MD;  Location: [facility] ENDOSCOPY;  Service: Gastroenterology;;   HOT HEMOSTASIS N/A 07/28/2023   Procedure: HOT HEMOSTASIS (ARGON PLASMA COAGULATION/BICAP);  Surgeon: Felecia Hopper, MD;  Location: [facility] ENDOSCOPY;  Service: Gastroenterology;  Laterality: N/A;   INSERT / REPLACE / REMOVE PACEMAKER     LEFT HEART CATH AND CORONARY ANGIOGRAPHY N/A 11/11/2021   Procedure: LEFT HEART CATH AND CORONARY ANGIOGRAPHY;  Surgeon: Arleen Lacer, MD;  Location: [facility] INVASIVE CV LAB;  Service: Cardiovascular;  Laterality: N/A;   LEFT HEART CATH AND CORS/GRAFTS ANGIOGRAPHY N/A 02/21/2018   Procedure: LEFT HEART CATH AND CORS/GRAFTS ANGIOGRAPHY;  Surgeon: Avanell Leigh, MD;  Location: [facility] CV LAB;  Service: Cardiovascular;  Laterality: N/A;   LEFT HEART CATH AND CORS/GRAFTS ANGIOGRAPHY N/A 03/08/2021   Procedure: LEFT HEART CATH AND CORS/GRAFTS ANGIOGRAPHY;  Surgeon: [place], Peter M, MD;  Location: [facility] INVASIVE CV LAB;  Service: Cardiovascular;  Laterality: N/A;   PORT-A-CATH REMOVAL N/A 06/12/2013   Procedure: REMOVAL PORT-A-CATH;  Surgeon: Enid Harry, MD;  Location: [facility];  Service: General;  Laterality: N/A;   PORTACATH PLACEMENT  06/04/2012   Procedure: INSERTION PORT-A-CATH;  Surgeon: Enid Harry, MD;  Location: [facility] OR;  Service: General;   Laterality: N/A;  Insertion of port-a-cath    POSTERIOR LAMINECTOMY / DECOMPRESSION CERVICAL SPINE  1990s   SCLEROTHERAPY  03/14/2021   Procedure: SCLEROTHERAPY;  Surgeon: Genell Ken, MD;  Location: [facility] ENDOSCOPY;  Service: Gastroenterology;;   VAGINAL HYSTERECTOMY     Social History:  reports that she has never smoked. She has never used smokeless tobacco. She reports that she does not drink alcohol and does not use drugs.  Family / Support Systems Marital Status: Married How Long?: 53 years Patient Roles: Spouse, Parent Spouse/Significant Other: James Children: two children- son recently had brain surgery due to epilepsy, and dtr lives in [place] and only PRN support Other Supports: N/A Anticipated Caregiver: Husband Ability/Limitations of Caregiver: Husband will be primary cargeiver and unable to physically lift her. 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   Abuse/Neglect Abuse/Neglect Assessment Can Be Completed: Unable  to assess, patient is non-responsive or altered mental status  Patient response to: Social Isolation - How often do you feel lonely or isolated from those around you?: Patient unable to respond  Emotional Status Pt's affect, behavior and adjustment status: Pt husband answered all questions for pt due to pt sleeping and pt having dementia Recent  Psychosocial Issues: Denies Psychiatric History: Pt husband reports she takes medication for depression Substance Abuse History: Reports she quit smoking cigarettes 30+ years ago; rare occasion glass of wine or 1/2 beer. Denies rec drug use.  Patient / Family Perceptions, Expectations & Goals Pt/Family understanding of illness & functional limitations: Pt husband has general understanding of care needs Premorbid pt/family roles/activities: Assistance with ADLs/IADLs Anticipated changes in roles/activities/participation: continued support Pt/family expectations/goals: Pt husband would ike for his wife to work on being self-sufficient as she was prior to fall in which she could perfor self care, shower, dress, go out to dinner, etc.  Building surveyor: None Premorbid Home Care/DME Agencies: None Transportation available at discharge: Husband Is the patient able to respond to transportation needs?: Yes In the past 12 months, has lack of transportation kept you from medical appointments or from getting medications?: No In the past 12 months, has lack of transportation kept you from meetings, work, or from getting things needed for daily living?: No Resource referrals recommended: Neuropsychology  Discharge Planning Living Arrangements: Spouse/significant other Support Systems: Spouse/significant other, Children Type of Residence: Private residence Insurance Resources: Electrical engineer Resources: Restaurant manager, fast food Screen Referred: No Living Expenses: Banker Management: Spouse Does the patient have any problems obtaining your medications?: No Home Management: Husband manages all home care needs Patient/Family Preliminary Plans: No changes Care Coordinator Barriers to Discharge: Decreased caregiver support, Lack of/limited family support Care Coordinator Anticipated Follow Up Needs: HH/OP Expected length of stay: 2-3 weeks  Clinical Impression SW  met with pt, pt husband and pt son to introduce self, explain role, and discuss discharge process. Pt is not  veteran. DME- 3in1 BSC, shower chair, and grad bars in shower.   *SW provided updates from team conference and informed on d/c date 5/6. Aware SW will follow-up with updates.   Rennis Case 10/24/2023, 2:58 PM

## 2023-10-24 NOTE — Progress Notes (Signed)
 Occupational Therapy Session Note  Patient Details  Name: ZYANN MABRY MRN: [id number] Date of Birth: May 03, 1951  Today's Date: 10/24/2023 OT Individual Time: 7829-5621 OT Individual Time Calculation (min): 44 min    Short Term Goals: Week 1:  OT Short Term Goal 1 (Week 1): Pt will perform toilet transfer with consistent Min A + LRAD. OT Short Term Goal 2 (Week 1): Pt will thread LB garments with Min A + LRAD. OT Short Term Goal 3 (Week 1): Pt will complete 3/3 toileting activities with Mod A + LRAD.   Skilled Therapeutic Interventions/Progress Updates:    Pt bed level at time of session, no pain at rest but with movement pt stating she is in significant pain in pelvis/R hip but unable to rate 2/2 cognition. Initial focus of session on LB dressing for TEDS and gripper socks bed level in prep for OOB, total A but encouraging pt to lift each LE to improve ROM and involvement. Supine <> sit several times with MAX A each attempt with extended time and pt significantly limited by pain and cognition - note in sitting pt saying "I have to lay down" and despite OT max encouragement pt taking self back to bed level multiple times. OT retrieved Stedy and attempted again, but upon weight shifting to stand, pt stating again "I can't do this" and taking self back to bed. Left bed level positioned in partial side lying for pressure relief - alarm on call bell inr each. Note cognitive deficits from dementia limiting this date.   Therapy Documentation Precautions:  Precautions Precautions: Fall Recall of Precautions/Restrictions: Impaired Precaution/Restrictions Comments: WBAT, Dementia, PPM (watch BP) Restrictions Weight Bearing Restrictions Per Provider Order: Yes LLE Weight Bearing Per Provider Order: Weight bearing as tolerated    Therapy/Group: Individual Therapy  [name] 10/24/2023, 7:27 AM

## 2023-10-24 NOTE — Progress Notes (Signed)
 Physical Therapy Session Note  Patient Details  Name: Rachel Clark MRN: [id number] Date of Birth: [birth year]  Today's Date: 10/24/2023 PT Individual Time: 1306-1400 PT Individual Time Calculation (min): 54 min   Short Term Goals: Week 1:  PT Short Term Goal 1 (Week 1): Pt will complete bed mobility with min assist PT Short Term Goal 2 (Week 1): Pt will complete bed to [facility] transfers with min assist and LRAD PT Short Term Goal 3 (Week 1): Pt will ambulates with RW with mod assist 50 with +2 WC follow PT Short Term Goal 4 (Week 1): Pt will complete up/down 1 6" step with BHRs  Skilled Therapeutic Interventions/Progress Updates:     Pt received supine in bed and agrees to therapy with gentle encouragement. Pt verbalizes pain in "hips", and PT provides education on cause of pain, as well as rationale for participation and movement to improve symptoms. Per husband, pt had received pain meds a few minutes prior to session. Pt performs supine to sit with modA and cues for sequencing and trunk control. Pt able to performs sit to stand from elevated bed with minA, but attempts to sit back down with pain provocation. PT provides cueing to maintain standing and pt is able to perform stand step transfer to [facility], though sits prior to being safely in front of WC, demonstrating deceased safety awareness. PT able to assist pt to shift hips back onto [facility] and provides education on safety.   Pt noted to have had urine incontinence. Pt performs sit to stand with Stedy with minA and cues for hand placement and body mechanics. Pt remains standing as PT assists with brief change and pericare. Pt stands multiple times during pericare to complete, working on functional mobility and endurance.   WC transport to gym. Pt performs sit to stand with minA/modA, and ambulates with RW and modA, with +2 WC follow. Pt ambulates x5' with extremely short strides and reporting pain with each attempted step. Pt requires constant  encouragement to continue ambulating. Following extended seated rest break, pt stands again with minA/modA, and is unable to initiate ambulation, initially reporting pain and with PT encouragement pt responds that she is "peeing in [her] pants". WC transport back to room. Stand step to bed with RW and modA, with improved safety. Sit to supine with minA and cues for positioning. Left with alarm intact and all needs within reach.   Therapy Documentation Precautions:  Precautions Precautions: Fall Recall of Precautions/Restrictions: Impaired Precaution/Restrictions Comments: WBAT, Dementia, PPM (watch BP) Restrictions Weight Bearing Restrictions Per Provider Order: Yes LLE Weight Bearing Per Provider Order: Weight bearing as tolerated    Therapy/Group: Individual Therapy  Neva Barban, PT, DPT 10/24/2023, 3:48 PM

## 2023-10-24 NOTE — Progress Notes (Signed)
 Occupational Therapy Session Note  Patient Details  Name: Rachel Clark MRN: [id number] Date of Birth: 07-20-1950  Today's Date: 10/24/2023 OT Individual Time: 1010-1104 OT Individual Time Calculation (min): 54 min   Today's Date: 10/24/2023 OT Individual Time: 1502-1530 OT Individual Time Calculation (min): 28 min   Short Term Goals: Week 1:  OT Short Term Goal 1 (Week 1): Pt will perform toilet transfer with consistent Min A + LRAD. OT Short Term Goal 2 (Week 1): Pt will thread LB garments with Min A + LRAD. OT Short Term Goal 3 (Week 1): Pt will complete 3/3 toileting activities with Mod A + LRAD.  Skilled Therapeutic Interventions/Progress Updates:   Session 1: Pt received sitting in [facility], spouse and son present. Pt with un-rated pain, pre-medicated, rest provided. Pt dependent propelled into bathroom, performing stand-pivot from WC>toilet/BSC with Min A + BUE on grab bar. Pt bathes UB with setup/supervision, managing bra with Min A for clasp, setup for donning t-shirt. Pt requires Max A for LB bathing/dressing due to decreased standing tolerance, RW utilized for BUE support. Pt performs stand-pivot from toilet/BSC>WC>EOB with Mod A due to pain/fatigue. Sit EOB>supine with Min A for BLE elevation. ROHO cushion retrieved for improved sitting tolerance. Pt remained resting in bed, bed alarm activated.    Session 2:  Pt received soundly sleeping, spouse and son present, un-rated pain with activity. Pt refuses OOB activity, but receptive of bed-level toileting due to bladder incontinence. Pt rolls laterally in bed with Mod A x2 due to stiffness/pain, Max A for 3/3 toileting activities and doffing of pants. MD made aware of frequency/incontinence issues, as well as, strong smelling urine. Pt remained resting in bed will all immediate needs met, spouse and son present in room.   Therapy Documentation Precautions:  Precautions Precautions: Fall Recall of Precautions/Restrictions:  Impaired Precaution/Restrictions Comments: WBAT, Dementia, PPM (watch BP) Restrictions Weight Bearing Restrictions Per Provider Order: Yes LLE Weight Bearing Per Provider Order: Weight bearing as tolerated   Therapy/Group: Individual Therapy  Artemus Biles, OTR/L, MSOT  10/24/2023, 6:22 AM

## 2023-10-24 NOTE — Discharge Summary (Addendum)
 [facility] Discharge Summary   Patient ID: Rachel Clark MRN: [id number] DOB/AGE: 75/29/1952 75 y.o.  Admit date: 10/17/2023 Discharge date: 10/22/2023  Admitting Diagnosis: Fall Pelvic fracture    Discharge Diagnosis Patient Active Problem List   Diagnosis Date Noted   Pelvic fracture (HCC) 10/17/2023   Other fatigue 09/28/2023   Hematochezia 07/26/2023   Dementia (HCC) 04/26/2023   Right leg pain 01/13/2022   Hypocalcemia 01/13/2022   NSTEMI (non-ST elevated myocardial infarction) (HCC) 11/11/2021   Presence of stent in left circumflex coronary artery    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    Non-ST elevation (NSTEMI) myocardial infarction (HCC) 11/10/2021   Acute upper GI bleed 03/13/2021   GI bleed 03/13/2021   Preventive measure 01/14/2021   Orthostatic hypotension 12/21/2020   Memory change 11/13/2016   Acute bronchitis 03/23/2016   Chest pain with high risk for cardiac etiology 11/05/2015   Essential hypertension 08/25/2015   Sick sinus syndrome (HCC) 08/16/2015   Syncope and collapse 08/10/2015   CAD, multiple vessel    Chest pain    Cardiac pacemaker in situ 07/25/2015   Angina pectoris (HCC) 03/24/2015   Unstable angina (HCC) 10/26/2014   Seizures (HCC) 02/19/2014   Lung nodule 02/06/2013   Thyroid  nodule 02/06/2013   History of colon cancer 12/04/2011   Iron  deficiency anemia due to chronic blood loss 11/17/2011   GERD (gastroesophageal reflux disease) 08/17/2011   Monoclonal gammopathy of unknown significance    Hypothyroid    Coronary artery disease, post CABG 2007     Dyslipidemia     Consultants Physical medicine and rehabilitation Orthopedic surgery   Imaging: VAS US  LOWER EXTREMITY VENOUS (DVT) Result Date: 10/23/2023  Lower Venous DVT Study Patient Name:  Rachel Clark  Date of Exam:   10/23/2023 Medical Rec #: [id number]          Accession #:    [id number] Date of Birth: 03/17/1951         Patient Gender: F  Patient Age:   73 years Exam Location:  [facility] Procedure:      VAS US  LOWER EXTREMITY VENOUS (DVT) Referring Phys: Georjean Kite --------------------------------------------------------------------------------  Indications: Swelling, and Edema.  Risk Factors: Trauma Recent fall. Comparison Study: None. Performing Technologist: Estanislao Heimlich  Examination Guidelines: A complete evaluation includes B-mode imaging, spectral Doppler, color Doppler, and power Doppler as needed of all accessible portions of each vessel. Bilateral testing is considered an integral part of a complete examination. Limited examinations for reoccurring indications may be performed as noted. The reflux portion of the exam is performed with the patient in reverse Trendelenburg.  +---------+---------------+---------+-----------+----------+--------------+ RIGHT    CompressibilityPhasicitySpontaneityPropertiesThrombus Aging +---------+---------------+---------+-----------+----------+--------------+ CFV      Full           Yes      Yes                                 +---------+---------------+---------+-----------+----------+--------------+ SFJ      Full                                                        +---------+---------------+---------+-----------+----------+--------------+ FV Prox  Full                                                        +---------+---------------+---------+-----------+----------+--------------+  FV Mid   Full                                                        +---------+---------------+---------+-----------+----------+--------------+ FV DistalFull                                                        +---------+---------------+---------+-----------+----------+--------------+ PFV      Full                                                        +---------+---------------+---------+-----------+----------+--------------+ POP      Full           Yes       Yes                                 +---------+---------------+---------+-----------+----------+--------------+ PTV      Full                    Yes                                 +---------+---------------+---------+-----------+----------+--------------+ PERO     Full                    Yes                                 +---------+---------------+---------+-----------+----------+--------------+   +---------+---------------+---------+-----------+----------+--------------+ LEFT     CompressibilityPhasicitySpontaneityPropertiesThrombus Aging +---------+---------------+---------+-----------+----------+--------------+ CFV      Full           Yes      Yes                                 +---------+---------------+---------+-----------+----------+--------------+ SFJ      Full                                                        +---------+---------------+---------+-----------+----------+--------------+ FV Prox  Full                                                        +---------+---------------+---------+-----------+----------+--------------+ FV Mid   Full                                                        +---------+---------------+---------+-----------+----------+--------------+  FV DistalFull                                                        +---------+---------------+---------+-----------+----------+--------------+ PFV      Full                                                        +---------+---------------+---------+-----------+----------+--------------+ POP      Full           Yes      Yes                                 +---------+---------------+---------+-----------+----------+--------------+ PTV      Full                    Yes                                 +---------+---------------+---------+-----------+----------+--------------+ PERO     Full                    Yes                                  +---------+---------------+---------+-----------+----------+--------------+     Summary: BILATERAL: - No evidence of deep vein thrombosis seen in the lower extremities, bilaterally. -No evidence of popliteal cyst, bilaterally.   *See table(s) above for measurements and observations. Electronically signed by Angela Kell MD on 10/23/2023 at 5:12:16 PM.    Final     Procedures None   Chief Complaint: Level 1 trauma activation for fal, hypotension   Primary Survey:  ABC's intact on arrival   The patient is an 73 y.o. female.    HPI: 33F s/p GLF. Reports not hitting her head and denies LOC.   Hospital Course:  Below is a complete list of the patients injuries along with their management:   Ground level fall L sacral ala and L superior pubic rami fx - non-operative management, WBAT per Dr. Guyann Leitz   Acute on chronic anemia - prior to admission had outpatient iron  infusion scheduled for 4/21, this infustion was ordered as an inpatient, Hgb overall stable 7.5 from 7.8 on the day of discharge to CIR. Hypothyroidism Epilepsy Dementia  Hx of complete heart block s/p PPM Hx of gastric ulcer Fibromyalgia  Hx of colon cancer s/p resection in 2013 CAD s/p CABG and stents  - holding anticoagulation, husband also reports hx GIB while on this medication.   - lipid panel WNL 4/19. Hold repatha  for now    FEN: reg diet, SLIV, bowel regimen VTE: SCD's, resume home effient  if hgb remains stable   On 10/22/23 the patient was stable for discharge to CIR She will need follow up with orthopedics, her PCP, her cardiologist.      Allergies as of 10/22/2023       Reactions   Sulfamethoxazole Anaphylaxis   Don't recall   Aspirin  Other (See Comments)  GI upset- can tolerate 81 mg ASA,and chewables, just not full doses   Crestor  [rosuvastatin ]    myalgias   Erythromycin Nausea Only   Gi upset   Lisinopril Cough   Niacin And Related Other (See Comments)   Don't recall   Penicillin G Benzathine     Other reaction(s): Unknown   Statins Other (See Comments)   Muscle pain   Sulfa Drugs Cross Reactors Swelling   Don't recall   Tetracyclines & Related Nausea Only   Xeloda  [capecitabine ] Diarrhea   Penicillins Nausea Only, Rash      Plavix  [clopidogrel  Bisulfate] Rash        Medication List     ASK your doctor about these medications    acetaminophen  500 MG tablet Commonly known as: TYLENOL  Take 1,000 mg by mouth every 6 (six) hours as needed for mild pain (pain score 1-3).   amLODipine  5 MG tablet Commonly known as: NORVASC  Take 5 mg by mouth daily.   aspirin  EC 81 MG tablet Take 81 mg by mouth daily. Swallow whole.   calcium  carbonate 500 MG chewable tablet Commonly known as: TUMS - dosed in mg elemental calcium  Chew 1 tablet by mouth daily.   carbamazepine  200 MG 12 hr tablet Commonly known as: TEGRETOL  XR Take 600 mg by mouth 2 (two) times daily.   DULoxetine  60 MG capsule Commonly known as: CYMBALTA  Take 60 mg by mouth 2 (two) times daily.   ethosuximide  250 MG capsule Commonly known as: ZARONTIN  Take 500 mg by mouth 2 (two) times daily.   isosorbide  mononitrate 30 MG 24 hr tablet Commonly known as: IMDUR  Take 15 mg by mouth daily.   levothyroxine  50 MCG tablet Commonly known as: SYNTHROID  Take 50 mcg by mouth daily.   memantine  10 MG tablet Commonly known as: NAMENDA  Take 10 mg by mouth 2 (two) times daily.   nitroGLYCERIN  0.4 MG SL tablet Commonly known as: NITROSTAT  Place 0.4 mg under the tongue every 5 (five) minutes as needed for chest pain.   pantoprazole  40 MG tablet Commonly known as: PROTONIX  Take 40 mg by mouth 2 (two) times daily.   prasugrel  10 MG Tabs tablet Commonly known as: EFFIENT  Take 10 mg by mouth daily.   Repatha  SureClick 140 MG/ML Soaj Generic drug: Evolocumab  Inject 140 mg into the skin every 14 (fourteen) days.           Signed: Michial Akin, [facility] 10/24/2023, 10:50 AM .

## 2023-10-24 NOTE — Progress Notes (Signed)
 PROGRESS NOTE   Subjective/Complaints: PT reports pain in her R > L leg.  VAS US  without evidence of DVT.   Last documented BM appears to be 4/20  ROS: Patient denies fever, new vision changes, dizziness, nausea, vomiting, diarrhea,  shortness of breath or chest pain, headache, or mood change. + Leg pain   Objective:   VAS US  LOWER EXTREMITY VENOUS (DVT) Result Date: 10/23/2023  Lower Venous DVT Study Patient Name:  Rachel Clark  Date of Exam:   10/23/2023 Medical Rec #: [id number]          Accession #:    [id number] Date of Birth: 1951/05/08         Patient Gender: F Patient Age:   73 years Exam Location:  [facility] Procedure:      VAS US  LOWER EXTREMITY VENOUS (DVT) Referring Phys: Georjean Kite --------------------------------------------------------------------------------  Indications: Swelling, and Edema.  Risk Factors: Trauma Recent fall. Comparison Study: None. Performing Technologist: Estanislao Heimlich  Examination Guidelines: A complete evaluation includes B-mode imaging, spectral Doppler, color Doppler, and power Doppler as needed of all accessible portions of each vessel. Bilateral testing is considered an integral part of a complete examination. Limited examinations for reoccurring indications may be performed as noted. The reflux portion of the exam is performed with the patient in reverse Trendelenburg.  +---------+---------------+---------+-----------+----------+--------------+ RIGHT    CompressibilityPhasicitySpontaneityPropertiesThrombus Aging +---------+---------------+---------+-----------+----------+--------------+ CFV      Full           Yes      Yes                                 +---------+---------------+---------+-----------+----------+--------------+ SFJ      Full                                                         +---------+---------------+---------+-----------+----------+--------------+ FV Prox  Full                                                        +---------+---------------+---------+-----------+----------+--------------+ FV Mid   Full                                                        +---------+---------------+---------+-----------+----------+--------------+ FV DistalFull                                                        +---------+---------------+---------+-----------+----------+--------------+ PFV      Full                                                        +---------+---------------+---------+-----------+----------+--------------+  POP      Full           Yes      Yes                                 +---------+---------------+---------+-----------+----------+--------------+ PTV      Full                    Yes                                 +---------+---------------+---------+-----------+----------+--------------+ PERO     Full                    Yes                                 +---------+---------------+---------+-----------+----------+--------------+   +---------+---------------+---------+-----------+----------+--------------+ LEFT     CompressibilityPhasicitySpontaneityPropertiesThrombus Aging +---------+---------------+---------+-----------+----------+--------------+ CFV      Full           Yes      Yes                                 +---------+---------------+---------+-----------+----------+--------------+ SFJ      Full                                                        +---------+---------------+---------+-----------+----------+--------------+ FV Prox  Full                                                        +---------+---------------+---------+-----------+----------+--------------+ FV Mid   Full                                                         +---------+---------------+---------+-----------+----------+--------------+ FV DistalFull                                                        +---------+---------------+---------+-----------+----------+--------------+ PFV      Full                                                        +---------+---------------+---------+-----------+----------+--------------+ POP      Full           Yes      Yes                                 +---------+---------------+---------+-----------+----------+--------------+  PTV      Full                    Yes                                 +---------+---------------+---------+-----------+----------+--------------+ PERO     Full                    Yes                                 +---------+---------------+---------+-----------+----------+--------------+     Summary: BILATERAL: - No evidence of deep vein thrombosis seen in the lower extremities, bilaterally. -No evidence of popliteal cyst, bilaterally.   *See table(s) above for measurements and observations. Electronically signed by Angela Kell MD on 10/23/2023 at 5:12:16 PM.    Final    Recent Labs    10/21/23 1543 10/23/23 0520  WBC 8.9 8.5  HGB 7.5* 8.3*  HCT 23.5* 25.7*  PLT 286 365   Recent Labs    10/22/23 1021 10/23/23 0520  NA 133* 132*  K 4.3 4.0  CL 103 103  CO2 21* 21*  GLUCOSE 95 114*  BUN 17 17  CREATININE 0.67 0.71  CALCIUM  8.5* 8.3*    Intake/Output Summary (Last 24 hours) at 10/24/2023 1241 Last data filed at 10/24/2023 0943 Gross per 24 hour  Intake 320 ml  Output --  Net 320 ml        Physical Exam: Vital Signs Blood pressure 136/63, pulse 88, temperature 98.6 F (37 C), temperature source Oral, resp. rate 16, SpO2 96%.    General: No apparent distress HEENT: Head is normocephalic, atraumatic, sclera anicteric, oral mucosa pink and moist Neck: Supple without JVD or lymphadenopathy Heart: Reg rate and rhythm. No murmurs rubs or  gallops Chest: CTA bilaterally without wheezes, rales, or rhonchi; no distress Abdomen: Soft, non-tender, non-distended, bowel sounds positive.  Psych: Pt's affect is appropriate. Pt is cooperative Skin: Clean and intact without signs of breakdown  Musculoskeletal:      R>L TTP b/l Legs Pt also has milder TTP thoughout b/l arm     Cervical back: Normal range of motion and neck supple.  Skin:    General: Skin is warm.     Findings: Bruising present.  Neuro:      Comments: Pt is awake and alert. Oriented to person and hospital, not year/month/day. Follows basic commands. Answers biographical questions, some times very matter-of-factly. Fair insight and awareness,  CN exam non-focal. MMT: 4+ to 5/5 bilateral upper ext. RLE 3/5 HF, KE and 4/5 ADF/PF> LLE 2-/5 HF, KE (pain component) and 4/5 APF/ADF. Sensory exam normal for light touch and pain in all 4 limbs. No limb ataxia or cerebellar signs. No abnormal tone appreciated.    Psychiatric:     Comments: Pt a bit dry but pleasant and cooperative.      Assessment/Plan: 1. Functional deficits which require 3+ hours per day of interdisciplinary therapy in a comprehensive inpatient rehab setting. Physiatrist is providing close team supervision and 24 hour management of active medical problems listed below. Physiatrist and rehab team continue to assess barriers to discharge/monitor patient progress toward functional and medical goals  Care Tool:  Bathing    Body parts bathed by patient: Right arm, Chest, Left arm, Abdomen, Right upper leg, Left upper leg,  Right lower leg, Left lower leg, Face   Body parts bathed by helper: Front perineal area, Buttocks     Bathing assist Assist Level: Minimal Assistance - Patient > 75%     Upper Body Dressing/Undressing Upper body dressing   What is the patient wearing?: Bra, Pull over shirt    Upper body assist Assist Level: Set up assist    Lower Body Dressing/Undressing Lower body dressing       What is the patient wearing?: Underwear/pull up, Pants     Lower body assist Assist for lower body dressing: Maximal Assistance - Patient 25 - 49%     Toileting Toileting    Toileting assist Assist for toileting: Maximal Assistance - Patient 25 - 49%     Transfers Chair/bed transfer  Transfers assist     Chair/bed transfer assist level: Moderate Assistance - Patient 50 - 74%     Locomotion Ambulation   Ambulation assist   Ambulation activity did not occur: Safety/medical concerns          Walk 10 feet activity   Assist  Walk 10 feet activity did not occur: Safety/medical concerns        Walk 50 feet activity   Assist Walk 50 feet with 2 turns activity did not occur: Safety/medical concerns         Walk 150 feet activity   Assist Walk 150 feet activity did not occur: Safety/medical concerns         Walk 10 feet on uneven surface  activity   Assist Walk 10 feet on uneven surfaces activity did not occur: Safety/medical concerns         Wheelchair     Assist Is the patient using a wheelchair?: Yes Type of Wheelchair: Manual    Wheelchair assist level: Dependent - Patient 0%      Wheelchair 50 feet with 2 turns activity    Assist        Assist Level: Dependent - Patient 0%   Wheelchair 150 feet activity     Assist      Assist Level: Dependent - Patient 0%   Blood pressure 136/63, pulse 88, temperature 98.6 F (37 C), temperature source Oral, resp. rate 16, SpO2 96%.  Medical Problem List and Plan: 1. Functional deficits secondary to left sacral ala and superior pubic rami fracture.  Weightbearing as tolerated per Dr. Guyann Leitz             -patient may shower             -ELOS/Goals: 10-14 days, supervision goals with PT, and sup/min assist with OT and SLP- order placed  -Exp DC 5/6 2.  Antithrombotics: -DVT/anticoagulation:  Pharmaceutical: Lovenox .  Check vascular study-negative              -antiplatelet  therapy: Aspirin  81 mg daily 3. Pain Management: Lidoderm  patch, oxycodone  as needed 4. Mood/Behavior/Sleep: Namenda  10 mg twice daily, Cymbalta  60 mg twice daily             -antipsychotic agents: N/A 5. Neuropsych/cognition: This patient is not quite capable of making decisions on her own behalf. 6. Skin/Wound Care: Routine skin checks 7. Fluids/Electrolytes/Nutrition: Routine in and outs with follow-up chemistries on admission 8.  Acute on chronic anemia/history of GI bleed/colon cancer.  Patient receives outpatient iron infusions.  Continue iron supplement.  Follow-up per hematology services Dr.Gorsuch  -4/22 HGB stable 8.3  Recheck tomorrow 9.  Epilepsy.  Tegretol  600 mg twice daily as well  as zarontin  500 mg twice daily. 10.  CAD/CABG/history of stents/PPM.  Continue low-dose aspirin .  Follow-up cardiology service Dr. [place].  Continue Imdur  15 mg daily. Denies CP 11.  Hypothyroidism.  Synthroid  12.  Hyponatremia.  Sodium chloride  tablets 1 g 3 times daily.  Follow-up chemistries tomorrow  -4/22 stable at 132  Recheck tomorrow  13.Hypertension.Norvasc  5 mg daily.Monitor with increased mobility             -bp borderline at present.  4/22 increase norvasc  to 7.5  4/23 monitor response to medication change, fair control     10/24/2023    4:31 AM 10/23/2023    8:08 PM 10/23/2023    4:39 PM  Vitals with BMI  Systolic 136 140 829  Diastolic 63 67 71  Pulse 88 88 90   14. Fibromyalgia   -Hx of fibromyalgia noted.   -Will start gabapentin 100mg  BID    LOS: 2 days A FACE TO FACE EVALUATION WAS PERFORMED  Lylia Sand 10/24/2023, 12:41 PM

## 2023-10-25 DIAGNOSIS — E871 Hypo-osmolality and hyponatremia: Secondary | ICD-10-CM | POA: Diagnosis not present

## 2023-10-25 DIAGNOSIS — N390 Urinary tract infection, site not specified: Secondary | ICD-10-CM

## 2023-10-25 DIAGNOSIS — S3282XD Multiple fractures of pelvis without disruption of pelvic ring, subsequent encounter for fracture with routine healing: Secondary | ICD-10-CM | POA: Diagnosis not present

## 2023-10-25 DIAGNOSIS — I10 Essential (primary) hypertension: Secondary | ICD-10-CM | POA: Diagnosis not present

## 2023-10-25 DIAGNOSIS — I25810 Atherosclerosis of coronary artery bypass graft(s) without angina pectoris: Secondary | ICD-10-CM | POA: Diagnosis not present

## 2023-10-25 DIAGNOSIS — E785 Hyperlipidemia, unspecified: Secondary | ICD-10-CM

## 2023-10-25 LAB — URINALYSIS, ROUTINE W REFLEX MICROSCOPIC
Bilirubin Urine: NEGATIVE
Glucose, UA: NEGATIVE mg/dL
Hgb urine dipstick: NEGATIVE
Ketones, ur: NEGATIVE mg/dL
Nitrite: POSITIVE — AB
Protein, ur: NEGATIVE mg/dL
Specific Gravity, Urine: 1.017 (ref 1.005–1.030)
WBC, UA: >50 WBC/hpf (ref 0–5)
pH: 5 (ref 5.0–8.0)

## 2023-10-25 LAB — CBC
HCT: 26.6 % — ABNORMAL LOW (ref 36.0–46.0)
Hemoglobin: 8.5 g/dL — ABNORMAL LOW (ref 12.0–15.0)
MCH: 28.9 pg (ref 26.0–34.0)
MCHC: 32 g/dL (ref 30.0–36.0)
MCV: 90.5 fL (ref 80.0–100.0)
Platelets: 454 10*3/uL — ABNORMAL HIGH (ref 150–400)
RBC: 2.94 MIL/uL — ABNORMAL LOW (ref 3.87–5.11)
RDW: 22.5 % — ABNORMAL HIGH (ref 11.5–15.5)
WBC: 7.5 10*3/uL (ref 4.0–10.5)
nRBC: 0.3 % — ABNORMAL HIGH (ref 0.0–0.2)

## 2023-10-25 LAB — BASIC METABOLIC PANEL WITH GFR
Anion gap: 11 (ref 5–15)
BUN: 20 mg/dL (ref 8–23)
CO2: 23 mmol/L (ref 22–32)
Calcium: 8.5 mg/dL — ABNORMAL LOW (ref 8.9–10.3)
Chloride: 101 mmol/L (ref 98–111)
Creatinine, Ser: 0.69 mg/dL (ref 0.44–1.00)
GFR, Estimated: >60 mL/min (ref >60)
Glucose, Bld: 108 mg/dL — ABNORMAL HIGH (ref 70–99)
Potassium: 4 mmol/L (ref 3.5–5.1)
Sodium: 135 mmol/L (ref 135–145)

## 2023-10-25 MED ORDER — EVOLOCUMAB 140 MG/ML ~~LOC~~ SOAJ: 140.0000 mg | SUBCUTANEOUS | Status: DC

## 2023-10-25 MED ORDER — SENNOSIDES-DOCUSATE SODIUM 8.6-50 MG PO TABS
2.0000 | ORAL_TABLET | Freq: Every day | ORAL | Status: DC
  Administered 2023-10-25 – 2023-10-30 (×5): 2 via ORAL
  Filled 2023-10-25 (×6): qty 2

## 2023-10-25 MED ORDER — EVOLOCUMAB 140 MG/ML ~~LOC~~ SOAJ
140.0000 mg | SUBCUTANEOUS | Status: DC
  Administered 2023-10-28: 140 mg via SUBCUTANEOUS
  Filled 2023-10-25: qty 1

## 2023-10-25 MED ORDER — CEPHALEXIN 250 MG PO CAPS
250.0000 mg | ORAL_CAPSULE | Freq: Four times a day (QID) | ORAL | Status: DC
Start: 2023-10-25 — End: 2023-10-25

## 2023-10-25 MED ORDER — CEPHALEXIN 250 MG PO CAPS
250.0000 mg | ORAL_CAPSULE | Freq: Four times a day (QID) | ORAL | Status: AC
  Administered 2023-10-25 – 2023-10-30 (×19): 250 mg via ORAL
  Filled 2023-10-25 (×20): qty 1

## 2023-10-25 NOTE — Progress Notes (Signed)
 Physical Therapy Session Note  Patient Details  Name: Rachel Clark MRN: [id number] Date of Birth: October 19, 1950  Today's Date: 10/25/2023 PT Individual Time: 0900-0930 PT Individual Time Calculation (min): 30 min   Short Term Goals: Week 1:  PT Short Term Goal 1 (Week 1): Pt will complete bed mobility with min assist PT Short Term Goal 2 (Week 1): Pt will complete bed to [facility] transfers with min assist and LRAD PT Short Term Goal 3 (Week 1): Pt will ambulates with RW with mod assist 50 with +2 WC follow PT Short Term Goal 4 (Week 1): Pt will complete up/down 1 6" step with BHRs  Skilled Therapeutic Interventions/Progress Updates: Pt presented in bed agreeable to therapy. Pt denies pain at rest but noted significant increase with mobility. Nsg present during part of session to administer am meds. Session focused on bed mobility and AROM in preparation for following therapies. PTA donned TED hose total A for time management. Pt then performed ankle pumps, heel slides, and hip abd/add x 10 bilaterally with very assist. Pt then completed supine to sit with modA and max multimodal cues. Pt required intermittent mod/maxA to scoot anteriorly to EOB. Pt donned shirt with set up and PTA threaded pants total A. PTA then raised bed and pt completed Sit to stand with minA with RW. Pt attempting to pull up pants then stating "It hurts too much" and returned to sitting. Pt attempting to return to supine to pull up pants however this therapist educated pt importance of performing functional tasks as would perform at home. Pt required two additional standing trials however was able to pull up pants with modA. With encouragement pt completed stand and took x 2 lateral shuffling steps to [facility]. Competed sit to supine with modA for BLE management. Pt left in bed at end of session with bed alarm on, call bell within reach and needs met.      Therapy Documentation Precautions:  Precautions Precautions: Fall Recall of  Precautions/Restrictions: Impaired Precaution/Restrictions Comments: WBAT, Dementia, PPM (watch BP) Restrictions Weight Bearing Restrictions Per Provider Order: Yes LLE Weight Bearing Per Provider Order: Weight bearing as tolerated General:   Vital Signs: Therapy Vitals BP: (!) 131/59    Therapy/Group: Individual Therapy  Tanara Turvey 10/25/2023, 11:22 AM

## 2023-10-25 NOTE — Progress Notes (Signed)
 Physical Therapy Session Note  Patient Details  Name: Rachel Clark MRN: [id number] Date of Birth: 12/23/1950  Today's Date: 10/25/2023 PT Individual Time: 1022-1102 + 4098-1191 PT Individual Time Calculation (min): 40 min + 27 min  Short Term Goals: Week 1:  PT Short Term Goal 1 (Week 1): Pt will complete bed mobility with min assist PT Short Term Goal 2 (Week 1): Pt will complete bed to [facility] transfers with min assist and LRAD PT Short Term Goal 3 (Week 1): Pt will ambulates with RW with mod assist 50 with +2 WC follow PT Short Term Goal 4 (Week 1): Pt will complete up/down 1 6" step with BHRs  Skilled Therapeutic Interventions/Progress Updates:    SESSION 1: Pt presents in room in bed, agreeable to PT with encouragement and increased time to orientation to situation. Pt provided with education throughout session on WBAT and importance of mobility and movement for decreasing pain and improving independence with pt demonstrating minimal to no carryover. Session focused on therapeutic activities for training bed mobility, transfers, and OOB tolerance as well as therapeutic exercise to promote BLE strengthening and decrease BLE pain. Pt completes bed mobility with hospital bed features, bed rail and elevated HOB, with min assist for trunk to upright and BLEs off bed. Pt completes sit<>stand in stedy with cues for sequencing, min assist and elevated EOB, transferred to [facility]. Pt then completes WC mobility ~100' with BLEs to promote BLE strengthening and mobility to decrease pain, pt requires frequent rest breaks and cues to continue activity. Pt then completes sit<>stand to RW with min assist and increased time, requires max assist for advancing BLEs one step each as pt states "my legs won't move" and pt returns to sitting. Pt then participates with interval training on kinetron x5 minutes 30 sec work/30 sec rest to promote BLE strengthening/mobility and to decrease pain. Pt requires frequent cues for  attending to task. Pt returned to room, compeltes stedy transfer with mod assist to recliner, pillows positioned for comfort to sit on and under bilateral lower legs to improve tolerance to upright and OOB. Pt provided with orientation signs and posters for orienting to situation and place as well as orientation to call light to call if she needs help. Pt verbalizing understanding however telesitter in place and posey belt alarm donned and activated at end of session.   SESSION 2: Pt presents in room seated in recliner, agreeable to PT. Pt denies pain at rest but expresses pain with all mobility. Session focused on transfer training, reorientation to situation and pain education, and gait training in //bars. Pt completes stand pivot transfer with RW with mod assist with pt demonstrating increased difficulty advancing BLEs however able to with increased time. Pt completes sit<>stand in //bars with min/mod assist. Pt completes gait training with therapist seated in front providing manual assist for advancing BLEs and weightshifting as pt demonstrates poor initiation of gait without assist. Pt completes x3 attempts, ambulates 1', 3' and 6'. Pt requires cues to maintain upright as pt attempt to sit frequently, cues for breathing and provided with education during rest breaks on pain and healing with pt verbalizing understanding but continues to be limited in her participation. Pt returns to room and remains seated in [facility] with all needs within reach, cal light in place and chair alarm donned and activated at end of session.   Therapy Documentation Precautions:  Precautions Precautions: Fall Recall of Precautions/Restrictions: Impaired Precaution/Restrictions Comments: WBAT, Dementia, PPM (watch BP) Restrictions Weight Bearing  Restrictions Per Provider Order: Yes LLE Weight Bearing Per Provider Order: Weight bearing as tolerated    Therapy/Group: Individual Therapy  Annia Kilts PT, DPT 10/25/2023,  11:09 AM

## 2023-10-25 NOTE — Progress Notes (Signed)
 Urine continues to be concentrated and tea colored. Encouraging patient to drink fluids today.

## 2023-10-25 NOTE — Plan of Care (Signed)
  Problem: Consults Goal: RH GENERAL PATIENT EDUCATION Description: See Patient Education module for education specifics. Outcome: Progressing   Problem: RH BOWEL ELIMINATION Goal: RH STG MANAGE BOWEL WITH ASSISTANCE Description: STG Manage Bowel with supervision Assistance. Outcome: Progressing   Problem: RH BLADDER ELIMINATION Goal: RH STG MANAGE BLADDER WITH ASSISTANCE Description: STG Manage Bladder With supervision  Assistance Outcome: Progressing   Problem: RH PAIN MANAGEMENT Goal: RH STG PAIN MANAGED AT OR BELOW PT'S PAIN GOAL Description: <4 w/ prns Outcome: Progressing   Problem: RH KNOWLEDGE DEFICIT GENERAL Goal: RH STG INCREASE KNOWLEDGE OF SELF CARE AFTER HOSPITALIZATION Description: Manage increase knowledge of self care after hospitalization with supervision from family using educational materials provided Outcome: Progressing

## 2023-10-25 NOTE — Progress Notes (Signed)
 Notified by spouse, update provided by assigned nurse.

## 2023-10-25 NOTE — Progress Notes (Addendum)
 PROGRESS NOTE   Subjective/Complaints: No new complaints or concerns elicited. She has been noted to have dark/foul smelling urine.  Patient has been noted to have bladder incontinence.  She is unable to recall much information about her bladder function.  Last documented BM appears to be 4/21  ROS: Patient denies fever, new vision changes, dizziness, nausea, vomiting, diarrhea,  shortness of breath or chest pain, headache, or mood change. + Leg pain-improved    Objective:   No results found.  Recent Labs    10/23/23 0520 10/25/23 0622  WBC 8.5 7.5  HGB 8.3* 8.5*  HCT 25.7* 26.6*  PLT 365 454*   Recent Labs    10/23/23 0520 10/25/23 0622  NA 132* 135  K 4.0 4.0  CL 103 101  CO2 21* 23  GLUCOSE 114* 108*  BUN 17 20  CREATININE 0.71 0.69  CALCIUM  8.3* 8.5*    Intake/Output Summary (Last 24 hours) at 10/25/2023 1129 Last data filed at 10/25/2023 1000 Gross per 24 hour  Intake 680 ml  Output 400 ml  Net 280 ml        Physical Exam: Vital Signs Blood pressure (!) 131/59, pulse 92, temperature 98.1 F (36.7 C), resp. rate 16, SpO2 94%.    General: No apparent distress HEENT: Head is normocephalic, atraumatic, sclera anicteric, oral mucosa pink and moist Neck: Supple without JVD or lymphadenopathy Heart: Reg rate and rhythm. No murmurs rubs or gallops Chest: CTA bilaterally without wheezes, rales, or rhonchi; no distress Abdomen: Soft, non-tender, non-distended, bowel sounds positive.  Psych: Pt's affect is appropriate. Pt is cooperative Skin: Clean and intact without signs of breakdown GU: Dark cloudy urine in canister Musculoskeletal:      R>L TTP b/l Legs- much decreased today R knee mild TTP     Cervical back: Normal range of motion and neck supple.  Skin:    General: Skin is warm.     Findings: Bruising present.  Neuro:      Comments: Pt is awake and alert. Oriented to person and hospital,  not year/month/day. Follows basic commands. Memory deficits present. Answers biographical questions, some times very matter-of-factly.  CN exam non-focal. MMT: 4+ to 5/5 bilateral upper ext. RLE 3/5 HF, KE and 4/5 ADF/PF> LLE 2-/5 HF, KE (pain component) and 4/5 APF/ADF. Sensory exam normal for light touch and pain in all 4 limbs. No limb ataxia or cerebellar signs. No abnormal tone appreciated.    Neuro Exam stable 4/24     Assessment/Plan: 1. Functional deficits which require 3+ hours per day of interdisciplinary therapy in a comprehensive inpatient rehab setting. Physiatrist is providing close team supervision and 24 hour management of active medical problems listed below. Physiatrist and rehab team continue to assess barriers to discharge/monitor patient progress toward functional and medical goals  Care Tool:  Bathing    Body parts bathed by patient: Right arm, Chest, Left arm, Abdomen, Right upper leg, Left upper leg, Right lower leg, Left lower leg, Face   Body parts bathed by helper: Front perineal area, Buttocks     Bathing assist Assist Level: Minimal Assistance - Patient > 75%     Upper Body Dressing/Undressing Upper  body dressing   What is the patient wearing?: Bra, Pull over shirt    Upper body assist Assist Level: Set up assist    Lower Body Dressing/Undressing Lower body dressing      What is the patient wearing?: Underwear/pull up, Pants     Lower body assist Assist for lower body dressing: Maximal Assistance - Patient 25 - 49%     Toileting Toileting    Toileting assist Assist for toileting: Maximal Assistance - Patient 25 - 49%     Transfers Chair/bed transfer  Transfers assist     Chair/bed transfer assist level: Moderate Assistance - Patient 50 - 74%     Locomotion Ambulation   Ambulation assist   Ambulation activity did not occur: Safety/medical concerns  Assist level: 2 helpers Assistive device: Walker-rolling Max distance: 5'    Walk 10 feet activity   Assist  Walk 10 feet activity did not occur: Safety/medical concerns        Walk 50 feet activity   Assist Walk 50 feet with 2 turns activity did not occur: Safety/medical concerns         Walk 150 feet activity   Assist Walk 150 feet activity did not occur: Safety/medical concerns         Walk 10 feet on uneven surface  activity   Assist Walk 10 feet on uneven surfaces activity did not occur: Safety/medical concerns         Wheelchair     Assist Is the patient using a wheelchair?: Yes Type of Wheelchair: Manual    Wheelchair assist level: Dependent - Patient 0%      Wheelchair 50 feet with 2 turns activity    Assist        Assist Level: Dependent - Patient 0%   Wheelchair 150 feet activity     Assist      Assist Level: Dependent - Patient 0%   Blood pressure (!) 131/59, pulse 92, temperature 98.1 F (36.7 C), resp. rate 16, SpO2 94%.  Medical Problem List and Plan: 1. Functional deficits secondary to left sacral ala and superior pubic rami fracture.  Weightbearing as tolerated per Dr. Guyann Leitz             -patient may shower             -ELOS/Goals: 10-14 days, supervision goals with PT, and sup/min assist with OT and SLP- order placed  -Exp DC 5/6 2.  Antithrombotics: -DVT/anticoagulation:  Pharmaceutical: Lovenox .  Check vascular study-negative              -antiplatelet therapy: Aspirin  81 mg daily 3. Pain Management: Lidoderm  patch, oxycodone  as needed 4. Mood/Behavior/Sleep: Namenda  10 mg twice daily, Cymbalta  60 mg twice daily             -antipsychotic agents: N/A 5. Neuropsych/cognition: This patient is not quite capable of making decisions on her own behalf. 6. Skin/Wound Care: Routine skin checks 7. Fluids/Electrolytes/Nutrition: Routine in and outs with follow-up chemistries on admission 8.  Acute on chronic anemia/history of GI bleed/colon cancer.  Patient receives outpatient iron infusions.   Continue iron supplement.  Follow-up per hematology services Dr.Gorsuch  -4/22 HGB stable 8.3  -4/24 Stable at 8.5 9.  Epilepsy.  Tegretol  600 mg twice daily as well as zarontin  500 mg twice daily. 10.  CAD/CABG/history of stents/PPM.  Continue low-dose aspirin .  Follow-up cardiology service Dr. [place].  Continue Imdur  15 mg daily. Denies CP 11.  Hypothyroidism.  Synthroid  12.  Hyponatremia.  Sodium chloride  tablets 1 g 3 times daily.  Follow-up chemistries tomorrow  -4/24 Up to 135  13.Hypertension.Norvasc  5 mg daily.Monitor with increased mobility             -bp borderline at present.  4/22 increase norvasc  to 7.5  4/24 fair control, continue current regimen and monitor     10/25/2023    9:19 AM 10/25/2023    5:05 AM 10/24/2023    7:44 PM  Vitals with BMI  Systolic 131 152 295  Diastolic 59 63 64  Pulse  92 88   14. Fibromyalgia   -Hx of fibromyalgia noted.   -Will start gabapentin 100mg  BID  -4/24 pain improved today  15. Urine incontinence  - Discussed with husband reports that she did not have frequent incontinence until recent hospitalization  - Check UA and culture 16. UTI  -Start Keflex 250mg  Q6h for 5 days, follow culture   17.  Hypercholesterolemia  -Husband report he brought it Rap[place] from home, last dose 4/13. Called pharmacy to discuss- will look into  18. Bowel function  - 10/2018 add Senokot, continue MiraLAX  twice daily  LOS: 3 days A FACE TO FACE EVALUATION WAS PERFORMED  Lylia Sand 10/25/2023, 11:29 AM

## 2023-10-25 NOTE — Evaluation (Signed)
 Speech Language Pathology Assessment and Plan  Patient Details  Name: Rachel Clark MRN: [id number] Date of Birth: [birth year]  SLP Diagnosis: Cognitive Impairments  Rehab Potential: Fair ELOS: n/a skilled ST not warranted    Today's Date: 10/25/2023 SLP Individual Time: 0800-0900 SLP Individual Time Calculation (min): 60 min   Hospital Problem: Principal Problem:   Pelvic fracture [facility])  Past Medical History:  Past Medical History:  Diagnosis Date   Arthritis    "fingers" (06/29/2016)   Chronic lower back pain    Colon cancer (HCC) 12/04/2011   s/p Laparoscopic-assisted transverse colectomy on 12/19/2011 by Dr. Delane Fear.  pT3 N0 M0.    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   last seizure was in 1972 ;takes Tegretol  (06/29/2016)    Headache    "weekly" (06/29/2016)   Hyperlipidemia    Hyponatremia    Hypothyroid    Iron  deficiency anemia 11/17/2011   Memory change 11/13/2016   Monoclonal gammopathy of unknown significance    Orthostatic hypotension    Presence of permanent cardiac pacemaker    Syncope and collapse    pacemaker implanted   Past Surgical History:  Past Surgical History:  Procedure Laterality Date   ANTERIOR CERVICAL DECOMP/DISCECTOMY FUSION  1980's   BACK SURGERY     CARDIAC CATHETERIZATION  05/20/2009   obstructive native vessel disease in LAD, RCA, and first diagonal, patent vein graft to distal RCA and LIMA to LAD,normal. ef 60%   CARDIAC CATHETERIZATION N/A 03/24/2015   Procedure: Left Heart Cath and Cors/Grafts Angiography;  Surgeon: Wenona Hamilton, MD;  Location: [facility] INVASIVE CV LAB;  Service: Cardiovascular;  Laterality: N/A;   CARDIAC CATHETERIZATION N/A 07/28/2015   Procedure: Left Heart Cath and Coronary Angiography;  Surgeon:  Millicent Ally, MD;  Location: [facility] INVASIVE CV LAB;  Service: Cardiovascular;  Laterality: N/A;   CARDIAC CATHETERIZATION N/A 11/09/2015   Procedure: Coronary Stent Intervention;  Surgeon: Arnoldo Lapping, MD;  Location: [facility] INVASIVE CV LAB;  Service: Cardiovascular;  Laterality: N/A;   CARDIAC CATHETERIZATION N/A 11/09/2015   Procedure: Left Heart Cath and Coronary Angiography;  Surgeon: Arnoldo Lapping, MD;  Location: [facility] INVASIVE CV LAB;  Service: Cardiovascular;  Laterality: N/A;   CARDIAC CATHETERIZATION N/A 06/29/2016   Procedure: Left Heart Cath and Coronary Angiography;  Surgeon: Sammy Crisp, MD;  Location: [facility] INVASIVE CV LAB;  Service: Cardiovascular;  Laterality: N/A;   CARDIAC CATHETERIZATION N/A 06/29/2016   Procedure: Intravascular Pressure Wire/FFR Study;  Surgeon: Sammy Crisp, MD;  Location: [facility] INVASIVE CV LAB;  Service: Cardiovascular;  Laterality: N/A;   CARDIAC CATHETERIZATION N/A 06/29/2016   Procedure: Coronary Balloon Angioplasty;  Surgeon: Sammy Crisp, MD;  Location: [facility] INVASIVE CV LAB;  Service: Cardiovascular;  Laterality: N/A;   COLON RESECTION  12/19/2011   Procedure: COLON RESECTION LAPAROSCOPIC;  Surgeon: Enid Harry, MD;  Location: [facility] OR;  Service: General;  Laterality: N/A;  laparoscopic hand assisted partial colon resection   COLON SURGERY     COLONOSCOPY     COLONOSCOPY WITH PROPOFOL  N/A 07/28/2023   Procedure: COLONOSCOPY WITH PROPOFOL ;  Surgeon: Felecia Hopper, MD;  Location: [facility] ENDOSCOPY;  Service: Gastroenterology;  Laterality: N/A;   CORONARY ANGIOPLASTY WITH STENT PLACEMENT  ~ 2007  1 stent   CORONARY ARTERY BYPASS GRAFT  2007   "CABG X2"   DILATION AND CURETTAGE OF UTERUS  1973   EP IMPLANTABLE DEVICE N/A 03/25/2015   MDT Advisa DR pacemaker implanted by Dr Nunzio Belch for transient complete heart block and syncope   ESOPHAGOGASTRODUODENOSCOPY (EGD) WITH PROPOFOL  N/A 03/14/2021   Procedure: ESOPHAGOGASTRODUODENOSCOPY (EGD) WITH PROPOFOL ;  Surgeon:  Genell Ken, MD;  Location: [facility] ENDOSCOPY;  Service: Gastroenterology;  Laterality: N/A;   ESOPHAGOGASTRODUODENOSCOPY (EGD) WITH PROPOFOL  N/A 11/13/2021   Procedure: ESOPHAGOGASTRODUODENOSCOPY (EGD) WITH PROPOFOL ;  Surgeon: Felecia Hopper, MD;  Location: [facility] ENDOSCOPY;  Service: Gastroenterology;  Laterality: N/A;   ESOPHAGOGASTRODUODENOSCOPY (EGD) WITH PROPOFOL  N/A 02/07/2023   Procedure: ESOPHAGOGASTRODUODENOSCOPY (EGD) WITH PROPOFOL ;  Surgeon: Felecia Hopper, MD;  Location: [facility] ENDOSCOPY;  Service: Gastroenterology;  Laterality: N/A;   HEMOSTASIS CLIP PLACEMENT  03/14/2021   Procedure: HEMOSTASIS CLIP PLACEMENT;  Surgeon: Genell Ken, MD;  Location: [facility] ENDOSCOPY;  Service: Gastroenterology;;   HEMOSTASIS CLIP PLACEMENT  07/28/2023   Procedure: HEMOSTASIS CLIP PLACEMENT;  Surgeon: Felecia Hopper, MD;  Location: [facility] ENDOSCOPY;  Service: Gastroenterology;;   HOT HEMOSTASIS N/A 07/28/2023   Procedure: HOT HEMOSTASIS (ARGON PLASMA COAGULATION/BICAP);  Surgeon: Felecia Hopper, MD;  Location: [facility] ENDOSCOPY;  Service: Gastroenterology;  Laterality: N/A;   INSERT / REPLACE / REMOVE PACEMAKER     LEFT HEART CATH AND CORONARY ANGIOGRAPHY N/A 11/11/2021   Procedure: LEFT HEART CATH AND CORONARY ANGIOGRAPHY;  Surgeon: Arleen Lacer, MD;  Location: [facility] INVASIVE CV LAB;  Service: Cardiovascular;  Laterality: N/A;   LEFT HEART CATH AND CORS/GRAFTS ANGIOGRAPHY N/A 02/21/2018   Procedure: LEFT HEART CATH AND CORS/GRAFTS ANGIOGRAPHY;  Surgeon: Avanell Leigh, MD;  Location: [facility] INVASIVE CV LAB;  Service: Cardiovascular;  Laterality: N/A;   LEFT HEART CATH AND CORS/GRAFTS ANGIOGRAPHY N/A 03/08/2021   Procedure: LEFT HEART CATH AND CORS/GRAFTS ANGIOGRAPHY;  Surgeon: Swaziland, Peter M, MD;  Location: [facility] INVASIVE CV LAB;  Service: Cardiovascular;  Laterality: N/A;   PORT-A-CATH REMOVAL N/A 06/12/2013   Procedure: REMOVAL PORT-A-CATH;  Surgeon: Enid Harry, MD;  Location: [facility];  Service:  General;  Laterality: N/A;   PORTACATH PLACEMENT  06/04/2012   Procedure: INSERTION PORT-A-CATH;  Surgeon: Enid Harry, MD;  Location: [facility] OR;  Service: General;  Laterality: N/A;  Insertion of port-a-cath    POSTERIOR LAMINECTOMY / DECOMPRESSION CERVICAL SPINE  1990s   SCLEROTHERAPY  03/14/2021   Procedure: SCLEROTHERAPY;  Surgeon: Genell Ken, MD;  Location: [facility] ENDOSCOPY;  Service: Gastroenterology;;   VAGINAL HYSTERECTOMY      Assessment / Plan / Recommendation Clinical Impression Pt is a 73 year old right-handed female with history of dementia maintained on Namenda , hypothyroidism, complete heart block status post permanent pacemaker as well as history of CABG with stents followed by cardiology service Dr. [place] maintained on low-dose aspirin , hypertension, history of GI bleed/gastric ulcer, epilepsy maintained on Tegretol , chronic anemia followed by hematology services Dr. Marton Sleeper as well as colon cancer. Per chart review lives with husband and son. Independent mobility but needed assistance due to dementia and safety awareness. She was rather sedentary prior to admission. Presented 10/17/2023 after fall. No report of loss of consciousness. Admission chemistries unremarkable except glucose 119, hemoglobin 8.9, WBC 13,500. Cranial CT scan negative for acute changes. 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 CT cervical spine negative. X-rays and CT imaging of the pelvis showed comminuted minimally displaced fracture of the left pubic body with extension along the left superior pubic ramus. Nondisplaced fracture  of the right sacral ala. Follow-up orthopedic service Dr. Kevan Peers nonoperative weightbearing as tolerated. Hospital course in regards to patient's acute on chronic anemia she was receiving iron  infusions as an outpatient which was administered 10/22/2023 while in the hospital. Hemoglobin ranges from 7.5-8.7. She was cleared to continue her low-dose aspirin  therapy. Lovenox   added for DVT prophylaxis 10/19/2023. She did have some hyponatremia 130-134 and was placed on sodium chloride  tablets 1 g 3 times daily 10/21/2023. Patient on Effient  as well as Repatha  prior to admission remains on hold. Therapy evaluations completed due to patient's decreased functional mobility was admitted for a comprehensive rehab program.   Portions of the [facility] Mental Status (SLUMS) Exam completed and she presented w/ profound cognitive deficits overall. This is c/w dx of dementia at baseline. She was only able to complete portions of the task d/t anxiety re cognitive testing. However, profound deficits noted in the areas of orientation, immediate memory, STM, calculations, naming, and attention. She was only oriented to her name and DOB; could not report her age. She frequently asked, "why are we doing this?" and "why are you doing this to me?" during the eval and benefited from frequent education/encouragement to attempt answers d/t increasing anxiety. Lack of insight into deficits negatively impacted pt participation as well. Given baseline dementia, lack of new neurological event, and pt anxiety; do not recommend cont ST at this time. Although the husband endorses increased cognitive deficits at this time, anticipate this is re to unfamiliar environment and disruption in pt routine. Skilled ST not warranted in this setting. Recommend 24/7 supervision upon d/c. She might also benefit from [facility] ST to ID any additional assistance/memory aids for the home environment.      Skilled Therapeutic Interventions          SLP facilitated a cognitive-linguistic evaluation to assess pt's cognitive-communication skills and determine need for additional skilled ST services. See above for more information.    SLP Assessment  Patient does not need any further Speech Lanaguage Pathology Services    Recommendations  SLP Diet Recommendations: Age appropriate regular solids;Thin Liquid Administration  via: Straw Medication Administration: Whole meds with liquid Supervision: Patient able to self feed Compensations: Minimize environmental distractions;Slow rate;Small sips/bites Postural Changes and/or Swallow Maneuvers: Upright 30-60 min after meal;Seated upright 90 degrees Oral Care Recommendations: Oral care BID Recommendations for Other Services: Therapeutic Recreation consult;Neuropsych consult Therapeutic Recreation Interventions: Pet therapy Patient destination: Home Follow up Recommendations: 24 hour supervision/assistance Equipment Recommended: None recommended by SLP    SLP Frequency  (n/a skilled ST not warranted)   SLP Duration  SLP Intensity  SLP Treatment/Interventions n/a skilled ST not warranted   (n/a skilled ST not warranted)  Other (comment) (n/a skilled ST not warranted)    Pain  No pain reported   SLP Evaluation Cognition Overall Cognitive Status: History of cognitive impairments - at baseline Arousal/Alertness: Awake/alert Orientation Level: Oriented to person Year: Other (Comment) (NR) Month:  (NR) Day of Week: Incorrect Attention: Focused;Sustained Focused Attention: Appears intact Sustained Attention: Impaired Sustained Attention Impairment: Verbal basic;Functional basic Memory: Impaired Memory Impairment: Retrieval deficit;Storage deficit;Decreased recall of new information Awareness: Impaired Awareness Impairment: Intellectual impairment Problem Solving: Impaired Problem Solving Impairment: Verbal basic Executive Function: Self Monitoring;Self Correcting Self Monitoring: Impaired Self Monitoring Impairment: Verbal basic;Functional basic Self Correcting: Impaired Self Correcting Impairment: Verbal basic;Functional basic Behaviors: Poor frustration tolerance;Confabulation;Other (comment) (increasing anxiety w/ questions) Safety/Judgment: Impaired  Comprehension Auditory Comprehension Overall Auditory Comprehension: Appears within  functional limits for  tasks assessed Expression Expression Primary Mode of Expression: Verbal Verbal Expression Overall Verbal Expression: Impaired Repetition: No impairment Naming: Impairment Divergent: 50-74% accurate Other Naming Comments: cognitive deficits negatively impact success Oral Motor Oral Motor/Sensory Function Overall Oral Motor/Sensory Function: Within functional limits Motor Speech Overall Motor Speech: Appears within functional limits for tasks assessed  Care Tool Care Tool Cognition Ability to hear (with hearing aid or hearing appliances if normally used Ability to hear (with hearing aid or hearing appliances if normally used): 1. Minimal difficulty - difficulty in some environments (e.g. when person speaks softly or setting is noisy)   Expression of Ideas and Wants Expression of Ideas and Wants: 3. Some difficulty - exhibits some difficulty with expressing needs and ideas (e.g, some words or finishing thoughts) or speech is not clear   Understanding Verbal and Non-Verbal Content Understanding Verbal and Non-Verbal Content: 2. Sometimes understands - understands only basic conversations or simple, direct phrases. Frequently requires cues to understand  Memory/Recall Ability Memory/Recall Ability : None of the above were recalled   Motor Speech Assessment  WFL    Short Term Goals: No short term goals set  Refer to Care Plan for Long Term Goals  Recommendations for other services: Neuropsych and Therapeutic Recreation  Pet therapy  Discharge Criteria: Patient will be discharged from SLP if patient refuses treatment 3 consecutive times without medical reason, if treatment goals not met, if there is a change in medical status, if patient makes no progress towards goals or if patient is discharged from hospital.  The above assessment, treatment plan, treatment alternatives and goals were discussed and mutually agreed upon: No family available/patient  unable  Rozell Cornet 10/25/2023, 9:48 AM

## 2023-10-25 NOTE — Progress Notes (Signed)
 Occupational Therapy Session Note  Patient Details  Name: Rachel Clark MRN: [id number] Date of Birth: 1951/06/25  Today's Date: 10/25/2023 OT Individual Time: 2956-2130 OT Individual Time Calculation (min): 69 min    Short Term Goals: Week 1:  OT Short Term Goal 1 (Week 1): Pt will perform toilet transfer with consistent Min A + LRAD. OT Short Term Goal 2 (Week 1): Pt will thread LB garments with Min A + LRAD. OT Short Term Goal 3 (Week 1): Pt will complete 3/3 toileting activities with Mod A + LRAD.  Skilled Therapeutic Interventions/Progress Updates:  Pt received sitting in WC, reports of increased pain, medicated prior to session, rest provided as requested. Pt dependently transported from room<>main therapy gym for energy conservation. In main gym, table-top activity attempted, pt unable to tolerate, downgraded to simple sit<>stands, pt requiring increased encouragement from therapist, re-oriented to situation/place. Patient politely requesting spouse to be present, receptive to seated BLE strengthening exercises, details below: LAQs 2 x 10 (with yellow theraband) Hip abductions 2 x 10 (with yellow theraband) Hip adductions 2 x 10 (with use of ball for resistance)  Pt then found to be incontinent, transported back to room, stand-step/stand-pivot from WC<>toilet/BSC with Min A + BUE support on grab bar. Pt requires Max A for 3/3 toileting activities and change of LB garments, Min-Mod A for standing balance due to increased pain. Pt returns to supine with A for BLE, education provided on log-roll technique for increased comfort. Pt remained resting in bed, bed alarm activated, and spouse/son present. Time dedicated to requesting initiation of timed toileting with care coordinator.   Therapy Documentation Precautions:  Precautions Precautions: Fall Recall of Precautions/Restrictions: Impaired Precaution/Restrictions Comments: WBAT, Dementia, PPM (watch BP) Restrictions Weight  Bearing Restrictions Per Provider Order: Yes LLE Weight Bearing Per Provider Order: Weight bearing as tolerated   Therapy/Group: Individual Therapy  Artemus Biles, OTR/L, MSOT  10/25/2023, 6:24 AM

## 2023-10-25 NOTE — IPOC Note (Addendum)
 Overall Plan of Care [facility]) Patient Details Name: Rachel Clark MRN: [id number] DOB: [birth year]  Admitting Diagnosis: Pelvic fracture [facility])  Hospital Problems: Principal Problem:   Pelvic fracture [facility])     Functional Problem List: Nursing Behavior, Bladder, Bowel, Edema, Endurance, Medication Management, Motor, Pain, Safety, Sensory, Perception  PT Balance, Endurance, Nutrition, Behavior, Pain, Perception, Safety, Sensory  OT Balance, Cognition, Endurance, Pain, Safety  SLP Cognition, Safety, Behavior, Linguistic  TR         Basic ADL's: OT Bathing, Dressing, Toileting     Advanced  ADL's: OT       Transfers: PT Bed Mobility, Bed to Chair, Customer service manager, Tub/Shower     Locomotion: PT Ambulation, Psychologist, prison and probation services, Stairs     Additional Impairments: OT    SLP Social Cognition   Problem Solving, Memory, Attention, Awareness  TR      Anticipated Outcomes Item Anticipated Outcome  Self Feeding    Swallowing      Basic self-care  Supervision  Toileting  Supervision   Bathroom Transfers Supervision  Bowel/Bladder  manage bowels with medications/manage bladder with toileting  Transfers  supervision  Locomotion  supervision ambulatory  Communication     Cognition     Pain  <4 w/ prns  Safety/Judgment  manage safety with with supervision   Therapy Plan: PT Intensity: Minimum of 1-2 x/day ,45 to 90 minutes PT Frequency: 5 out of 7 days PT Duration Estimated Length of Stay: 2-3 weeks OT Intensity: Minimum of 1-2 x/day, 45 to 90 minutes OT Frequency: 5 out of 7 days OT Duration/Estimated Length of Stay: 10-14 days SLP Intensity:  (n/a skilled ST not warranted) SLP Frequency:  (n/a skilled ST not warranted) SLP Duration/Estimated Length of Stay: n/a skilled ST not warranted   Team Interventions: Nursing Interventions Patient/Family Education, Disease Management/Prevention, Skin Care/Wound Management, Discharge Planning, Bladder Management,  Bowel Management, Medication Management, Pain Management, Cognitive Remediation/Compensation  PT interventions Ambulation/gait training, Discharge planning, Functional mobility training, Psychosocial support, Therapeutic Activities, Visual/perceptual remediation/compensation, Warden/ranger, Disease management/prevention, Neuromuscular re-education, Therapeutic Exercise, Wheelchair propulsion/positioning, Cognitive remediation/compensation, DME/adaptive equipment instruction, Pain management, UE/LE Strength taining/ROM, Community reintegration, Equities trader education, Museum/gallery curator, UE/LE Coordination activities  OT Interventions Warden/ranger, Disease mangement/prevention, Cognitive remediation/compensation, Firefighter, Discharge planning, DME/adaptive equipment instruction, Functional electrical stimulation, Functional mobility training, Neuromuscular re-education, Pain management, Patient/family education, Psychosocial support, Self Care/advanced ADL retraining, Skin care/wound managment, Splinting/orthotics, Therapeutic Activities, UE/LE Strength taining/ROM, UE/LE Coordination activities, Visual/perceptual remediation/compensation, Therapeutic Exercise, Wheelchair propulsion/positioning  SLP Interventions Other (comment) (n/a skilled ST not warranted)  TR Interventions    SW/CM Interventions Discharge Planning, Psychosocial Support, Patient/Family Education   Barriers to Discharge MD  Medical stability and Incontinence  Nursing Decreased caregiver support, Home environment access/layout, Incontinence, Weight bearing restrictions, Behavior Discharge: House  Discharge Home Layout: One level  Discharge Home Access: Stairs to enter  Entrance Stairs-Rails: Left  Entrance Stairs-Number of Steps: 2  PT Inaccessible home environment, Decreased caregiver support, Home environment access/layout, Incontinence pt husband unable to provide physical assist, per husband  pt demonstrating new incontinence, pt has 2 STE home  OT Incontinence    SLP Other (comments) baseline dementia  SW Decreased caregiver support, Lack of/limited family support     Team Discharge Planning: Destination: PT-Home ,OT- Home , SLP-Home Projected Follow-up: PT-Home health PT, OT-  Home health OT, SLP-24 hour supervision/assistance Projected Equipment Needs: PT-Rolling walker with 5" wheels, OT- To be determined, SLP-None recommended by SLP Equipment Details: PT- , OT-  Patient/family involved in discharge planning: PT- Patient, Family member/caregiver,  OT-Patient, SLP-Patient unable/family or caregive not available  MD ELOS: 14-20 days Medical Rehab Prognosis:  Excellent Assessment: The patient has been admitted for CIR therapies with the diagnosis of left sacral ala and superior pubic rami fracture . The team will be addressing functional mobility, strength, stamina, balance, safety, adaptive techniques and equipment, self-care, bowel and bladder mgt, patient and caregiver education. Goals have been set at sup. Anticipated discharge destination is home.        See Team Conference Notes for weekly updates to the plan of care

## 2023-10-26 ENCOUNTER — Inpatient Hospital Stay (HOSPITAL_COMMUNITY)

## 2023-10-26 DIAGNOSIS — K5900 Constipation, unspecified: Secondary | ICD-10-CM

## 2023-10-26 DIAGNOSIS — M797 Fibromyalgia: Secondary | ICD-10-CM | POA: Diagnosis not present

## 2023-10-26 DIAGNOSIS — N390 Urinary tract infection, site not specified: Secondary | ICD-10-CM | POA: Diagnosis not present

## 2023-10-26 DIAGNOSIS — I10 Essential (primary) hypertension: Secondary | ICD-10-CM | POA: Diagnosis not present

## 2023-10-26 DIAGNOSIS — M25551 Pain in right hip: Secondary | ICD-10-CM

## 2023-10-26 DIAGNOSIS — S3282XD Multiple fractures of pelvis without disruption of pelvic ring, subsequent encounter for fracture with routine healing: Secondary | ICD-10-CM | POA: Diagnosis not present

## 2023-10-26 MED ORDER — GABAPENTIN 100 MG PO CAPS: 200.0000 mg | ORAL_CAPSULE | Freq: Three times a day (TID) | ORAL | Status: DC

## 2023-10-26 MED ORDER — GABAPENTIN 100 MG PO CAPS
100.0000 mg | ORAL_CAPSULE | Freq: Three times a day (TID) | ORAL | Status: DC
  Administered 2023-10-26 – 2023-11-06 (×33): 100 mg via ORAL
  Filled 2023-10-26 (×33): qty 1

## 2023-10-26 MED ORDER — MUSCLE RUB 10-15 % EX CREA
TOPICAL_CREAM | CUTANEOUS | Status: DC | PRN
  Filled 2023-10-26 (×2): qty 85

## 2023-10-26 MED ORDER — SORBITOL 70 % SOLN
30.0000 mL | Freq: Every day | Status: DC | PRN
  Administered 2023-10-31: 30 mL via ORAL
  Filled 2023-10-26: qty 30

## 2023-10-26 NOTE — Progress Notes (Signed)
 Occupational Therapy Session Note  Patient Details  Name: Rachel Clark MRN: [id number] Date of Birth: July 31, 1950  Session 1 Today's Date: 10/26/2023 OT Individual Time: 6644-0347 OT Individual Time Calculation (min): 43 min    Session 2 Today's Date: 10/26/2023 OT Individual Time: 4259-5638 OT Individual Time Calculation (min): 60 min  and Today's Date: 10/26/2023 OT Missed Time: 15 Minutes Missed Time Reason: Patient fatigue   Short Term Goals: Week 1:  OT Short Term Goal 1 (Week 1): Pt will perform toilet transfer with consistent Min A + LRAD. OT Short Term Goal 2 (Week 1): Pt will thread LB garments with Min A + LRAD. OT Short Term Goal 3 (Week 1): Pt will complete 3/3 toileting activities with Mod A + LRAD.  Skilled Therapeutic Interventions/Progress Updates:    Session 1 Pt received supine with no c/o pain, agreeable to OT session. Session focused on ADL retraining. Extra time spent providing direct, gentle reorientation to increase awareness to situation, as well as build therapeutic rapport for maximal participation. She came to EOB with only min cueing for initiation and mod A overall to use chuck pad to swivel hips. She completed UB ADLs EOB with close (S), good automatic initiation of bathing tasks with a wash cloth. She stood from EOB in the stedy x2 for brief change and donning of pants. She was able to thread LLE into pants with mod cueing for technique. She was able to remove BUE from the stedy bar and fully weightbear through BLE to pull pants up as well as to perform peri hygiene. CGA provided for balance within stedy. She was then able to brush her teeth while remaining perched in the stedy. She transferred to the w/c following with min A. OT provided assist with hair washing in the sink- max A. Pt able to blow dry her hair following with set up assist. Stand pivot to the recliner with the RW with min A overall, however premature and unsafe descent. Pt was left sitting  up in the recliner with all needs met, chair alarm set, and call bell within reach.   Session 2  Pt received sitting in the recliner with no c/o pain initially. Her husband and son were present. Extra time spent discussing plan for the session and encouraging pt to participate with the help of her family. She stood x3 to initiate mobility but then sat back down quickly. She completed a stand pivot to the w/c with mod A to guide her hips d/t premature descent. The pad she was sitting on was saturated with both urine and stool. She was fully unaware of this. Pt was taken into the bathroom and stand pivot with min A. She required total A of two people to get clean d/t extensive mess. She stood x5 with mod A for about 30 second intervals before needing to sit. LPN provided her pain medication following. Pt was taken via w/c to the therapy gym for time management. Pt completed the BUE ergometer to challenge BUE strength and endurance needed to complete ADLs and IADLs with the highest level of independence. Pt completed 10 min with cueing for pacing and technique. She completed 5 ft of functional mobility to the bed with the RW at min A level. She required mod A to bring BLE into bed. She was left supine with all needs met, bed alarm set.   Therapy Documentation Precautions:  Precautions Precautions: Fall Recall of Precautions/Restrictions: Impaired Precaution/Restrictions Comments: WBAT, Dementia, PPM (watch BP) Restrictions Weight  Bearing Restrictions Per Provider Order: Yes RLE Weight Bearing Per Provider Order: Weight bearing as tolerated LLE Weight Bearing Per Provider Order: Weight bearing as tolerated  Therapy/Group: Individual Therapy  Una Ganser 10/26/2023, 8:07 AM

## 2023-10-26 NOTE — Progress Notes (Signed)
 Physical Therapy Session Note  Patient Details  Name: LENNON RICHINS MRN: [id number] Date of Birth: [birth year]  Today's Date: 10/26/2023 PT Individual Time: [phone] PT Individual Time Calculation (min): 55 min + 28 min   Short Term Goals: Week 1:  PT Short Term Goal 1 (Week 1): Pt will complete bed mobility with min assist PT Short Term Goal 2 (Week 1): Pt will complete bed to [facility] transfers with min assist and LRAD PT Short Term Goal 3 (Week 1): Pt will ambulates with RW with mod assist 50 with +2 WC follow PT Short Term Goal 4 (Week 1): Pt will complete up/down 1 6" step with BHRs  Skilled Therapeutic Interventions/Progress Updates:    SESSION 1: Pt presents in room seated in recliner, agreeable to PT with encouragement and time for orientation to situation and place. Pt denies pain at start of session but perseverative on pain with mobility requiring frequent cues and education to facilitate participation as pt quick to give up. Session focused on gait training and therapeutic activities to promote tolerance to upright as well as education on pain, mobility, and risk of inactivity and not progressing through pain. Pt completes stand step transfer with mod assist with RW. Pt completes sit<>stand with RW with min assist at start of session but requires mod assist for sit<>stand with fatigue and increased pain. Pt ambulates and mod assist for BLE advancing and RW 5' then 3' with +2 for WC follow, pt will sit if WC is right behind pt and attempt to ambulate away from [facility] to prevent immediate sitting, max cues for encouraging participation and increased distance as well as manual facilitation to increase step height and length bilaterally. Pt requires extended seated rest breaks between gait trials during which pt provided with education. Pt husband and son present halfway through session to provide encouragement. Pt then completes standing in standing frame to promote improved tolerance  to upright, pt reporting pain throughout and requesting to sit however able to tolerate up to 2 minutes in standing frame with cues for upright posture. Vitals assessed before and during standing noted below. RN notified of pt pain and present to administer pain medication during session. Pt returns to room, requires mod assist for stedy transfer back to recliner, remains seated in recliner with all needs within reach, cal light in place and chair alarm donned and activated at end of session.  Sitting: BP 156/79, HR 81 Standing: BP 174/74, HR 81   SESSION 2: Pt presents in room seated in recliner having just returned from the bathroom with nursing staff. Pt husband reports pt ambulated with nursing staff to bathroom ~15' and sat on toilet, managing clothing with supervision prior to therapist entering. Pt reporting fatigue and pain in BLEs but agreeable with max encouragement to participate. Session focused on therapeutic activities to promote activity tolerance, weightbearing tolerance, and tolerance to ROM. Pt completes stand step transfer with mod assist with RW recliner to [facility] and transported dependently to day room. Pt completes stand step transfer to nustep with RW mod assist, demonstrating bilateral flexed knees and decreased proximity to RW. Pt completes continuous training on nustep BUE/BLE 10 minutes for improving activity tolerance, weightbearing and ROM tolerance with pt tolerating very well. Pt enjoys the snowy trail and listening to music to improve participation. Pt completes transfer with mod assist and RW back to [facility], transported dependently via WC back to room and compeltes stand step transfer with RW back to recliner. Pt returns  to room and remains seated in recliner with all needs within reach, cal light in place and chair alarm donned and activated at end of session.    Therapy Documentation Precautions:  Precautions Precautions: Fall Recall of Precautions/Restrictions:  Impaired Precaution/Restrictions Comments: WBAT, Dementia, PPM (watch BP) Restrictions Weight Bearing Restrictions Per Provider Order: Yes RLE Weight Bearing Per Provider Order: Weight bearing as tolerated LLE Weight Bearing Per Provider Order: Weight bearing as tolerated   Therapy/Group: Individual Therapy  Annia Kilts PT, DPT 10/26/2023, 1:29 PM

## 2023-10-26 NOTE — Progress Notes (Signed)
 Rounding on patient at end of the shift observed on sternum area erythema and per patient area is "itchy".

## 2023-10-26 NOTE — Plan of Care (Signed)
  Problem: Consults Goal: RH GENERAL PATIENT EDUCATION Description: See Patient Education module for education specifics. Outcome: Progressing   Problem: RH BOWEL ELIMINATION Goal: RH STG MANAGE BOWEL WITH ASSISTANCE Description: STG Manage Bowel with supervision Assistance. Outcome: Progressing Flowsheets (Taken 10/26/2023 1635) STG: Pt will manage bowels with assistance: 3-Moderate assistance   Problem: RH BLADDER ELIMINATION Goal: RH STG MANAGE BLADDER WITH ASSISTANCE Description: STG Manage Bladder With supervision  Assistance Outcome: Progressing Flowsheets (Taken 10/26/2023 1635) STG: Pt will manage bladder with assistance: 3-Moderate assistance   Problem: RH SKIN INTEGRITY Goal: RH STG SKIN FREE OF INFECTION/BREAKDOWN Description: Manage skin free of infection/breakdown with supervision assistance Outcome: Progressing   Problem: RH SAFETY Goal: RH STG ADHERE TO SAFETY PRECAUTIONS W/ASSISTANCE/DEVICE Description: STG Adhere to Safety Precautions With  supervision Assistance/Device. Outcome: Progressing Flowsheets (Taken 10/26/2023 1635) STG:Pt will adhere to safety precautions with assistance/device: 4-Minimal assistance   Problem: RH PAIN MANAGEMENT Goal: RH STG PAIN MANAGED AT OR BELOW PT'S PAIN GOAL Description: <4 w/ prns Outcome: Not Progressing   Problem: RH KNOWLEDGE DEFICIT GENERAL Goal: RH STG INCREASE KNOWLEDGE OF SELF CARE AFTER HOSPITALIZATION Description: Manage increase knowledge of self care after hospitalization with supervision from family using educational materials provided Outcome: Not Progressing

## 2023-10-26 NOTE — Progress Notes (Addendum)
 PROGRESS NOTE   Subjective/Complaints: No acute events noted overnight.  Patient reporting some worsened pain around her right pelvis/hip.   LBM today  ROS: Patient denies fever, new vision changes, dizziness, nausea, vomiting, diarrhea,  shortness of breath or chest pain, headache, or mood change. + R hip/pelvis area pain    Objective:   No results found.  Recent Labs    10/25/23 0622  WBC 7.5  HGB 8.5*  HCT 26.6*  PLT 454*   Recent Labs    10/25/23 0622  NA 135  K 4.0  CL 101  CO2 23  GLUCOSE 108*  BUN 20  CREATININE 0.69  CALCIUM  8.5*    Intake/Output Summary (Last 24 hours) at 10/26/2023 1435 Last data filed at 10/26/2023 1200 Gross per 24 hour  Intake 438 ml  Output --  Net 438 ml        Physical Exam: Vital Signs Blood pressure (!) 148/60, pulse 86, temperature 97.7 F (36.5 C), resp. rate 16, SpO2 96%.    General: No apparent distress HEENT: Head is normocephalic, atraumatic, sclera anicteric, oral mucosa pink and moist Neck: Supple without JVD or lymphadenopathy Heart: Reg rate and rhythm. No murmurs rubs or gallops Chest: CTA bilaterally without wheezes, rales, or rhonchi; no distress Abdomen: Soft, non-tender, non-distended, bowel sounds positive.  Psych: Pt's affect is appropriate. Pt is cooperative Skin: Clean and intact without signs of breakdown GU: Dark cloudy urine in canister Musculoskeletal:      R greater trochanter with mild TTP, mild pain report with internal and external rotation R hip     Cervical back: Normal range of motion and neck supple.  Skin:    General: Skin is warm.     Findings: Bruising present.  Neuro:      Comments: Pt is awake and alert. Oriented to person and hospital, not year/month/day. Follows basic commands. Memory deficits present. Answers biographical questions, some times very matter-of-factly.  CN exam non-focal. MMT: 4+ to 5/5 bilateral upper  ext. RLE 3/5 HF, KE and 4/5 ADF/PF> LLE 3-4/5 HF and 4/5 APF/ADF. Sensory exam normal for light touch and pain in all 4 limbs. No limb ataxia or cerebellar signs. No abnormal tone appreciated.         Assessment/Plan: 1. Functional deficits which require 3+ hours per day of interdisciplinary therapy in a comprehensive inpatient rehab setting. Physiatrist is providing close team supervision and 24 hour management of active medical problems listed below. Physiatrist and rehab team continue to assess barriers to discharge/monitor patient progress toward functional and medical goals  Care Tool:  Bathing    Body parts bathed by patient: Right arm, Chest, Left arm, Abdomen, Right upper leg, Left upper leg, Right lower leg, Left lower leg, Face   Body parts bathed by helper: Front perineal area, Buttocks     Bathing assist Assist Level: Minimal Assistance - Patient > 75%     Upper Body Dressing/Undressing Upper body dressing   What is the patient wearing?: Bra, Pull over shirt    Upper body assist Assist Level: Set up assist    Lower Body Dressing/Undressing Lower body dressing      What is the  patient wearing?: Underwear/pull up, Pants     Lower body assist Assist for lower body dressing: Maximal Assistance - Patient 25 - 49%     Toileting Toileting    Toileting assist Assist for toileting: Maximal Assistance - Patient 25 - 49%     Transfers Chair/bed transfer  Transfers assist     Chair/bed transfer assist level: Moderate Assistance - Patient 50 - 74%     Locomotion Ambulation   Ambulation assist   Ambulation activity did not occur: Safety/medical concerns  Assist level: 2 helpers Assistive device: Walker-rolling Max distance: 5'   Walk 10 feet activity   Assist  Walk 10 feet activity did not occur: Safety/medical concerns        Walk 50 feet activity   Assist Walk 50 feet with 2 turns activity did not occur: Safety/medical concerns          Walk 150 feet activity   Assist Walk 150 feet activity did not occur: Safety/medical concerns         Walk 10 feet on uneven surface  activity   Assist Walk 10 feet on uneven surfaces activity did not occur: Safety/medical concerns         Wheelchair     Assist Is the patient using a wheelchair?: Yes Type of Wheelchair: Manual    Wheelchair assist level: Dependent - Patient 0%      Wheelchair 50 feet with 2 turns activity    Assist        Assist Level: Dependent - Patient 0%   Wheelchair 150 feet activity     Assist      Assist Level: Dependent - Patient 0%   Blood pressure (!) 148/60, pulse 86, temperature 97.7 F (36.5 C), resp. rate 16, SpO2 96%.  Medical Problem List and Plan: 1. Functional deficits secondary to left sacral ala and superior pubic rami fracture.  Weightbearing as tolerated per Dr. Guyann Leitz             -patient may shower             -ELOS/Goals: 10-14 days, supervision goals with PT, and sup/min assist with OT and SLP- order placed  -Exp DC 5/6 2.  Antithrombotics: -DVT/anticoagulation:  Pharmaceutical: Lovenox .  Check vascular study-negative              -antiplatelet therapy: Aspirin  81 mg daily 3. Pain Management: Lidoderm  patch, oxycodone  as needed  -4/24 Check xray pelvis/Hip right  -Muscle rub 4. Mood/Behavior/Sleep: Namenda  10 mg twice daily, Cymbalta  60 mg twice daily             -antipsychotic agents: N/A 5. Neuropsych/cognition: This patient is not quite capable of making decisions on her own behalf. 6. Skin/Wound Care: Routine skin checks 7. Fluids/Electrolytes/Nutrition: Routine in and outs with follow-up chemistries on admission 8.  Acute on chronic anemia/history of GI bleed/colon cancer.  Patient receives outpatient iron infusions.  Continue iron supplement.  Follow-up per hematology services Dr.Gorsuch  -4/22 HGB stable 8.3  -4/24 Stable at 8.5 9.  Epilepsy.  Tegretol  600 mg twice daily as well as  zarontin  500 mg twice daily. 10.  CAD/CABG/history of stents/PPM.  Continue low-dose aspirin .  Follow-up cardiology service Dr. [place].  Continue Imdur  15 mg daily. Denies CP 11.  Hypothyroidism.  Synthroid  12.  Hyponatremia.  Sodium chloride  tablets 1 g 3 times daily.  Follow-up chemistries tomorrow  -4/24 Up to 135  13.Hypertension.Norvasc  5 mg daily.Monitor with increased mobility             -  bp borderline at present.  4/22 increase norvasc  to 7.5  4/24-25 fair control, continue current regimen and monitor     10/26/2023    9:40 AM 10/26/2023    6:10 AM 10/25/2023    8:00 PM  Vitals with BMI  Systolic 148 134 454  Diastolic 60 60 63  Pulse 86 82 98   14. Fibromyalgia   -Hx of fibromyalgia noted.   -Will start gabapentin 100mg  BID  -Increase gabapentin to 100mg  TID  15. Urine incontinence  - Discussed with husband reports that she did not have frequent incontinence until recent hospitalization  -4/24 Check UA and culture  16. UTI  -Start Keflex 250mg  Q6h for 5 days, follow culture  -4/25 urine noted to be yellow clear today, monitor culture    17.  Hypercholesterolemia  -Husband report he brought it [place] from home, last dose 4/13. Called pharmacy to discuss- will look into  18. Bowel function  - 4/24 add Senokot, continue MiraLAX  twice daily  -4/24 LBM today  LOS: 4 days A FACE TO FACE EVALUATION WAS PERFORMED  Lylia Sand 10/26/2023, 2:35 PM

## 2023-10-26 NOTE — Plan of Care (Signed)
  Problem: Consults Goal: RH GENERAL PATIENT EDUCATION Description: See Patient Education module for education specifics. Outcome: Progressing   Problem: RH BOWEL ELIMINATION Goal: RH STG MANAGE BOWEL WITH ASSISTANCE Description: STG Manage Bowel with supervision Assistance. Outcome: Progressing   Problem: RH BLADDER ELIMINATION Goal: RH STG MANAGE BLADDER WITH ASSISTANCE Description: STG Manage Bladder With supervision  Assistance Outcome: Progressing   Problem: RH SKIN INTEGRITY Goal: RH STG SKIN FREE OF INFECTION/BREAKDOWN Description: Manage skin free of infection/breakdown with supervision assistance Outcome: Progressing   Problem: RH SAFETY Goal: RH STG ADHERE TO SAFETY PRECAUTIONS W/ASSISTANCE/DEVICE Description: STG Adhere to Safety Precautions With  supervision Assistance/Device. Outcome: Progressing   Problem: RH PAIN MANAGEMENT Goal: RH STG PAIN MANAGED AT OR BELOW PT'S PAIN GOAL Description: <4 w/ prns Outcome: Progressing   Problem: RH KNOWLEDGE DEFICIT GENERAL Goal: RH STG INCREASE KNOWLEDGE OF SELF CARE AFTER HOSPITALIZATION Description: Manage increase knowledge of self care after hospitalization with supervision from family using educational materials provided Outcome: Progressing

## 2023-10-27 DIAGNOSIS — S3282XD Multiple fractures of pelvis without disruption of pelvic ring, subsequent encounter for fracture with routine healing: Secondary | ICD-10-CM | POA: Diagnosis not present

## 2023-10-27 DIAGNOSIS — I10 Essential (primary) hypertension: Secondary | ICD-10-CM | POA: Diagnosis not present

## 2023-10-27 DIAGNOSIS — N390 Urinary tract infection, site not specified: Secondary | ICD-10-CM | POA: Diagnosis not present

## 2023-10-27 MED ORDER — ACETAMINOPHEN 500 MG PO TABS
500.0000 mg | ORAL_TABLET | ORAL | Status: DC | PRN
  Filled 2023-10-27: qty 1

## 2023-10-27 MED ORDER — ACETAMINOPHEN 500 MG PO TABS
500.0000 mg | ORAL_TABLET | Freq: Three times a day (TID) | ORAL | Status: DC
  Administered 2023-10-27 – 2023-10-29 (×7): 500 mg via ORAL
  Filled 2023-10-27 (×7): qty 1

## 2023-10-27 NOTE — Progress Notes (Signed)
 PROGRESS NOTE   Subjective/Complaints:  Pt doing ok, slept ok. Pain managed overall, but therapist mentions that she really only complains of pain during sessions and she doesn't really ask for the PRNs, thinks scheduled meds would be more beneficial to control pain during the sessions. Agreeable with trying scheduled tylenol  for now-- pt doesn't want to take much. Will see if this helps. LBM just now. Urinating fine. Denies any other complaints or concerns today.   ROS: as per HPI. Denies CP, SOB, abd pain, N/V/D/C, or any other complaints at this time.   + R hip/pelvis area pain    Objective:   DG HIP UNILAT WITH PELVIS 2-3 VIEWS RIGHT Result Date: 10/26/2023 CLINICAL DATA:  Right hip pain.  Known pelvic fracture. EXAM: DG HIP (WITH OR WITHOUT PELVIS) 2-3V RIGHT COMPARISON:  CT chest, abdomen, and pelvis 10/17/2023, AP pelvis 10/17/2023 FINDINGS: There is diffuse decreased bone mineralization. Redemonstration of comminuted fracture of the superior aspect of the left pubic body extending into the adjacent superior left pubic ramus with unchanged mild displacement of the cortical fragments. Again the nondisplaced fracture of the right sacral ala described on prior CT is not well visualized on radiographs. Mild bilateral sacroiliac subchondral sclerosis. IMPRESSION: 1. Redemonstration of comminuted fracture of the superior aspect of the left pubic body extending into the adjacent superior left pubic ramus with unchanged mild displacement of the cortical fragments. 2. Again the nondisplaced fracture of the right sacral ala described on prior CT is not well visualized on radiographs. Electronically Signed   By: Bertina Broccoli M.D.   On: 10/26/2023 19:02    Recent Labs    10/25/23 0622  WBC 7.5  HGB 8.5*  HCT 26.6*  PLT 454*   Recent Labs    10/25/23 0622  NA 135  K 4.0  CL 101  CO2 23  GLUCOSE 108*  BUN 20  CREATININE 0.69   CALCIUM  8.5*    Urinalysis    Component Value Date/Time   COLORURINE AMBER (A) 10/25/2023 1539   APPEARANCEUR CLOUDY (A) 10/25/2023 1539   LABSPEC 1.017 10/25/2023 1539   LABSPEC 1.020 06/12/2014 1436   PHURINE 5.0 10/25/2023 1539   GLUCOSEU NEGATIVE 10/25/2023 1539   GLUCOSEU Negative 06/12/2014 1436   HGBUR NEGATIVE 10/25/2023 1539   BILIRUBINUR NEGATIVE 10/25/2023 1539   BILIRUBINUR Negative 06/12/2014 1436   KETONESUR NEGATIVE 10/25/2023 1539   PROTEINUR NEGATIVE 10/25/2023 1539   UROBILINOGEN 0.2 06/12/2014 1436   NITRITE POSITIVE (A) 10/25/2023 1539   LEUKOCYTESUR LARGE (A) 10/25/2023 1539   LEUKOCYTESUR Negative 06/12/2014 1436    UCX: pending 10/27/23    Intake/Output Summary (Last 24 hours) at 10/27/2023 1150 Last data filed at 10/27/2023 0841 Gross per 24 hour  Intake 338 ml  Output --  Net 338 ml        Physical Exam: Vital Signs Blood pressure (!) 145/68, pulse 98, temperature 98.5 F (36.9 C), temperature source Oral, resp. rate 16, height 5\' 8"  (1.727 m), SpO2 99%.    General: No apparent distress, sitting on commode (dressed, just taking a break) HEENT: Head is normocephalic, atraumatic, sclera anicteric, oral mucosa pink and moist Neck: Supple without JVD  or lymphadenopathy Heart: Reg rate and rhythm. No murmurs rubs or gallops Chest: CTA bilaterally without wheezes, rales, or rhonchi; no distress Abdomen: Soft, non-tender, non-distended, bowel sounds positive.  Psych: Pt's affect is appropriate. Pt is cooperative Skin: Clean and intact without signs of breakdown  PRIOR EXAMS: GU: Dark cloudy urine in canister Musculoskeletal:      R greater trochanter with mild TTP, mild pain report with internal and external rotation R hip     Cervical back: Normal range of motion and neck supple.  Skin:    General: Skin is warm.     Findings: Bruising present.  Neuro:      Comments: Pt is awake and alert. Oriented to person and hospital, not  year/month/day. Follows basic commands. Memory deficits present. Answers biographical questions, some times very matter-of-factly.  CN exam non-focal. MMT: 4+ to 5/5 bilateral upper ext. RLE 3/5 HF, KE and 4/5 ADF/PF> LLE 3-4/5 HF and 4/5 APF/ADF. Sensory exam normal for light touch and pain in all 4 limbs. No limb ataxia or cerebellar signs. No abnormal tone appreciated.         Assessment/Plan: 1. Functional deficits which require 3+ hours per day of interdisciplinary therapy in a comprehensive inpatient rehab setting. Physiatrist is providing close team supervision and 24 hour management of active medical problems listed below. Physiatrist and rehab team continue to assess barriers to discharge/monitor patient progress toward functional and medical goals  Care Tool:  Bathing    Body parts bathed by patient: Right arm, Chest, Left arm, Abdomen, Right upper leg, Left upper leg, Right lower leg, Left lower leg, Face   Body parts bathed by helper: Front perineal area, Buttocks     Bathing assist Assist Level: Minimal Assistance - Patient > 75%     Upper Body Dressing/Undressing Upper body dressing   What is the patient wearing?: Bra, Pull over shirt    Upper body assist Assist Level: Set up assist    Lower Body Dressing/Undressing Lower body dressing      What is the patient wearing?: Underwear/pull up, Pants     Lower body assist Assist for lower body dressing: Maximal Assistance - Patient 25 - 49%     Toileting Toileting    Toileting assist Assist for toileting: Maximal Assistance - Patient 25 - 49%     Transfers Chair/bed transfer  Transfers assist     Chair/bed transfer assist level: Moderate Assistance - Patient 50 - 74%     Locomotion Ambulation   Ambulation assist   Ambulation activity did not occur: Safety/medical concerns  Assist level: 2 helpers Assistive device: Walker-rolling Max distance: 5'   Walk 10 feet activity   Assist  Walk 10  feet activity did not occur: Safety/medical concerns        Walk 50 feet activity   Assist Walk 50 feet with 2 turns activity did not occur: Safety/medical concerns         Walk 150 feet activity   Assist Walk 150 feet activity did not occur: Safety/medical concerns         Walk 10 feet on uneven surface  activity   Assist Walk 10 feet on uneven surfaces activity did not occur: Safety/medical concerns         Wheelchair     Assist Is the patient using a wheelchair?: Yes Type of Wheelchair: Manual    Wheelchair assist level: Dependent - Patient 0%      Wheelchair 50 feet with 2 turns activity  Assist        Assist Level: Dependent - Patient 0%   Wheelchair 150 feet activity     Assist      Assist Level: Dependent - Patient 0%   Blood pressure (!) 145/68, pulse 98, temperature 98.5 F (36.9 C), temperature source Oral, resp. rate 16, height 5\' 8"  (1.727 m), SpO2 99%.  Medical Problem List and Plan: 1. Functional deficits secondary to left sacral ala and superior pubic rami fracture.  Weightbearing as tolerated per Dr. Guyann Leitz             -patient may shower -ELOS/Goals: 10-14 days, supervision goals with PT, and sup/min assist with OT and SLP- order placed  -Exp DC 5/6 2.  Antithrombotics: -DVT/anticoagulation:  Pharmaceutical: Lovenox  30mg  BID.  Check vascular study-negative              -antiplatelet therapy: Aspirin  81 mg daily 3. Pain Management: Lidoderm  patch, oxycodone  as needed  -4/24 Check xray pelvis/Hip right-- stable  -Muscle rub -10/27/23 xray stable; PT noting that she doesn't ask for PRNs, and mostly has pain during sessions; will schedule 500mg  tylenol  TID and see if that helps for sessions.  4. Mood/Behavior/Sleep: Namenda  10 mg twice daily, Cymbalta  60 mg twice daily             -antipsychotic agents: N/A 5. Neuropsych/cognition: This patient is not quite capable of making decisions on her own behalf. 6. Skin/Wound  Care: Routine skin checks 7. Fluids/Electrolytes/Nutrition: Routine in and outs with follow-up chemistries, cont vitamins/supplements 8.  Acute on chronic anemia/history of GI bleed/colon cancer.  Patient receives outpatient iron  infusions.  Continue iron  supplement.  Follow-up per hematology services Dr.Gorsuch  -4/22 HGB stable 8.3  -4/24 Stable at 8.5 9.  Epilepsy.  Tegretol  600 mg twice daily as well as zarontin  500 mg twice daily. 10.  CAD/CABG/history of stents/PPM.  Continue low-dose aspirin .  Follow-up cardiology service Dr. [place].  Continue Imdur  15 mg daily. Denies CP 11.  Hypothyroidism.  Synthroid  50mcg daily 12.  Hyponatremia.  Sodium chloride  tablets 1 g 3 times daily.  Follow-up chemistries tomorrow  -4/24 Up to 135  13.Hypertension. Norvasc  5 mg daily. Monitor with increased mobility             -bp borderline at present.  4/22 increase norvasc  to 7.5  4/24-25 fair control, continue current regimen and monitor  -10/27/23 BPs variable but fair, monitor.   Vitals:   10/24/23 0431 10/24/23 1548 10/24/23 1944 10/25/23 0505  BP: 136/63 129/69 136/64 (!) 152/63   10/25/23 0919 10/25/23 1615 10/25/23 2000 10/26/23 0610  BP: (!) 131/59 128/66 125/63 134/60   10/26/23 0940 10/26/23 1552 10/26/23 1954 10/27/23 0311  BP: (!) 148/60 132/60 (!) 151/64 (!) 145/68     14. Fibromyalgia   -Hx of fibromyalgia noted.   -Will start gabapentin  100mg  BID  -Increase gabapentin  to 100mg  TID  15. Urine incontinence - Discussed with husband reports that she did not have frequent incontinence until recent hospitalization  -4/24 Check UA and culture -10/27/23 U/A c/w UTI, started on keflex  250mg  QID on 10/25/23 PM, EOT 10/30/23 PM-- UCx pending, will follow  16. UTI  -Start Keflex  250mg  Q6h for 5 days, follow culture  -4/25 urine noted to be yellow clear today, monitor culture   -10/27/23 see above, will follow UCx   17.  Hypercholesterolemia -Husband report he brought it [place] from home,  last dose 4/13. Called pharmacy to discuss- will look into  18. Bowel function -  4/24 add Senokot S 2 tabs QHS, continue MiraLAX  twice daily, colace 100mg  BID  -10/27/23 LBM today  LOS: 5 days A FACE TO FACE EVALUATION WAS PERFORMED  [address] Cassanda Walmer 10/27/2023, 11:50 AM

## 2023-10-27 NOTE — Progress Notes (Signed)
 Physical Therapy Session Note  Patient Details  Name: Rachel Clark MRN: [id number] Date of Birth: December 15, 1950  Today's Date: 10/27/2023 PT Individual Time: 0905-0945 PT Individual Time Calculation (min): 40 min   Short Term Goals: Week 1:  PT Short Term Goal 1 (Week 1): Pt will complete bed mobility with min assist PT Short Term Goal 2 (Week 1): Pt will complete bed to [facility] transfers with min assist and LRAD PT Short Term Goal 3 (Week 1): Pt will ambulates with RW with mod assist 50 with +2 WC follow PT Short Term Goal 4 (Week 1): Pt will complete up/down 1 6" step with BHRs  Skilled Therapeutic Interventions/Progress Updates:    Pt presents in room supine in bed, agreeable to PT. Pt does not report pain in supine but does report it with mobility and upright. Pt with improved orientation to situation this session without cues. Session focused on therapeutic activity for tolerance to upright as well as gait training with pt extrinsically motivated to ambulate due to urge to use restroom. Pt completes transfers with min assist to RW, ambulates ~20' with cues for gait mechanics including increasing proximity to RW and cues for step height and length, pt able to manage threshold with min assist. Pt turns to sit on BSC over toilet with min assist, able to maintain standing with mod assist for managing lower body dressing due to urgency. Pt comes to sitting on toilet. Pt noted to be incontinent of bladder, continent of bowel, charted. Pt able to complete periarea hygiene while seated with supervision, requires assist for thoroughness secondary to incontinence while pt standing. Pt requires mod assist for managing LB dressing over hips while in standing, then sits to [facility] for rest break. PA arrives and therapist communicates with PA on scheduled pain meds as pt not reporting pain at rest but significantly limited by pain in sessions. Pt then able to stand to RW with attempt x2, ambulates with RW ~10' to [facility]  with mod cues for proximity to RW and sequencing gait mechanics as pt demonstrating step to gait pattern leading with RLE. Pt turns to sit in [facility] with mod assist as pt prematurely sitting secondary to fatigue. Pt prompted to complete hand hygiene from seated position to decrease infection risk as well as to promote improved forward reach as pt hesitant to complete hip flexion. Pt then completes stand step transfer with RW to recliner min/mod assist. Pt remains seated in recliner with all needs within reach, cal light in place and chair alarm donned and activated at end of session.   Therapy Documentation Precautions:  Precautions Precautions: Fall Recall of Precautions/Restrictions: Impaired Precaution/Restrictions Comments: WBAT, Dementia, PPM (watch BP) Restrictions Weight Bearing Restrictions Per Provider Order: Yes RLE Weight Bearing Per Provider Order: Weight bearing as tolerated LLE Weight Bearing Per Provider Order: Weight bearing as tolerated   Therapy/Group: Individual Therapy  Annia Kilts PT, DPT 10/27/2023, 9:51 AM

## 2023-10-27 NOTE — Progress Notes (Signed)
 Occupational Therapy Session Note  Patient Details  Name: Rachel Clark MRN: [id number] Date of Birth: 1950/07/07  Today's Date: 10/27/2023 OT Individual Time: 1115-1200 OT Individual Time Calculation (min): 45 min    Short Term Goals: Week 1:  OT Short Term Goal 1 (Week 1): Pt will perform toilet transfer with consistent Min A + LRAD. OT Short Term Goal 2 (Week 1): Pt will thread LB garments with Min A + LRAD. OT Short Term Goal 3 (Week 1): Pt will complete 3/3 toileting activities with Mod A + LRAD.   Skilled Therapeutic Interventions/Progress Updates:    Pt up in recliner at time of session with family present - initially resistant to any activity, toileting, getting out of room, etc but with MAX encouargement and family encouragement, pt agreeable  to light ADL task. Sit > stand at [facility] MOD A consistently, stand pivot with MIN/MOD A for control and descent as pt quickly sits down despite cues. Seated oral hygiene and face washing with Supervision. Short distance wheelchair propulsion CGA. Pt with incontinent episode after declining toileting multiple times - static stand at [facility] for OT to assist with brief change and clothing change, MAX for LB dressing overall. Set up alarm on call bell in reach family present. Note pt did c/o pelvic pain during movement but did not rate. Dementia and cognitive deficits very limiting this date.   Therapy Documentation Precautions:  Precautions Precautions: Fall Recall of Precautions/Restrictions: Impaired Precaution/Restrictions Comments: WBAT, Dementia, PPM (watch BP) Restrictions Weight Bearing Restrictions Per Provider Order: Yes RLE Weight Bearing Per Provider Order: Weight bearing as tolerated LLE Weight Bearing Per Provider Order: Weight bearing as tolerated    Therapy/Group: Individual Therapy  [name] 10/27/2023, 7:30 AM

## 2023-10-27 NOTE — Progress Notes (Signed)
 Occupational Therapy Session Note  Patient Details  Name: Rachel Clark MRN: [id number] Date of Birth: 11/10/1950  Today's Date: 10/27/2023 OT Individual Time: 1010-1100 OT Individual Time Calculation (min): 50 min    Short Term Goals: Week 1:  OT Short Term Goal 1 (Week 1): Pt will perform toilet transfer with consistent Min A + LRAD. OT Short Term Goal 2 (Week 1): Pt will thread LB garments with Min A + LRAD. OT Short Term Goal 3 (Week 1): Pt will complete 3/3 toileting activities with Mod A + LRAD.  Skilled Therapeutic Interventions/Progress Updates:  (1st) OT Treatment Session:    Patient seated at w/c LOF at the time of arrival.  Patient inidcated that she rested well, however, she reported at pain response of 5 for low back pain.  The pt went on to say that nursing was aware of her pain and would be bring meds in shortly.  Nursing came in to address the pt's pain response during the OT treatment session.  The pt went on to complete sit to stand 3X using the RW for additional balance with ModA for coming from sit, which transition to MinA.  The patient went on to complete UB exercise using a 2lb dowel, 2 sets of 10 for shld flexion, shld rotation, horizontal abductionl , and lg circles with 2 rest breaks incorporating relaxation breathing. The pt went on to complete a simulated task in UB/LB dressing.  The pt was able to donn the theraband over her UB with 1 demonstration and initial cues.  The pt was able to donn the theraband onto her LB with initial demonstration and vc with ModA for donning the the theraband to her thighs.  The pt was unable to continue with sit to stands secondary to pain response.  . At the end of the session, the pt remained in the recliner with her call light and bed side table within reach.  Family was present at the time of treatment with instructions provided effective carryover.    (2nd ) OT Treatment Session: Patient seated in the recliner with family present  at the time of arrival.  The pt reported a pain response for the lower back of 5 on a 0-10.  The pt was in agreement with completing  UB exercises to improve her functional Ind and safety. The pt completed bicep curls, horizontal abduction, and elbow extension 2 sets of 10 with rest break as needed using a 2lb dumb bell, the pt required 2 rest breaks and was encouraged to incorporate relaxation breathing to improve compliance. While seated, the pt was able to retrieve cones and place them in my opposite hand to improve upon AROM and retrieval of objects for function.  The pt went on to complete a ball toss exercise,  in various planes.  The pt was able to adjust her anatomical positioning for retrieving the ball and returning it to me 5x's. At the end of the session, the pt remained in the recliner with her family present throughout the tx session and all additional needs addressed prior to me exiting the room.   Therapy Documentation Precautions:  Precautions Precautions: Fall Recall of Precautions/Restrictions: Impaired Precaution/Restrictions Comments: WBAT, Dementia, PPM (watch BP) Restrictions Weight Bearing Restrictions Per Provider Order: Yes RLE Weight Bearing Per Provider Order: Weight bearing as tolerated LLE Weight Bearing Per Provider Order: Weight bearing as tolerated Other Treatments:     Therapy/Group: Individual Therapy  Moises Ang 10/27/2023, 4:01 PM

## 2023-10-28 DIAGNOSIS — I10 Essential (primary) hypertension: Secondary | ICD-10-CM | POA: Diagnosis not present

## 2023-10-28 DIAGNOSIS — N390 Urinary tract infection, site not specified: Secondary | ICD-10-CM | POA: Diagnosis not present

## 2023-10-28 DIAGNOSIS — S3282XD Multiple fractures of pelvis without disruption of pelvic ring, subsequent encounter for fracture with routine healing: Secondary | ICD-10-CM | POA: Diagnosis not present

## 2023-10-28 LAB — CULTURE, OB URINE: Culture: >=100000 — AB

## 2023-10-28 NOTE — Plan of Care (Signed)
  Problem: Consults Goal: RH GENERAL PATIENT EDUCATION Description: See Patient Education module for education specifics. Outcome: Progressing   Problem: RH BOWEL ELIMINATION Goal: RH STG MANAGE BOWEL WITH ASSISTANCE Description: STG Manage Bowel with supervision Assistance. Outcome: Progressing   Problem: RH BLADDER ELIMINATION Goal: RH STG MANAGE BLADDER WITH ASSISTANCE Description: STG Manage Bladder With supervision  Assistance Outcome: Progressing   Problem: RH SKIN INTEGRITY Goal: RH STG SKIN FREE OF INFECTION/BREAKDOWN Description: Manage skin free of infection/breakdown with supervision assistance Outcome: Progressing   Problem: RH SAFETY Goal: RH STG ADHERE TO SAFETY PRECAUTIONS W/ASSISTANCE/DEVICE Description: STG Adhere to Safety Precautions With  supervision Assistance/Device. Outcome: Progressing   Problem: RH KNOWLEDGE DEFICIT GENERAL Goal: RH STG INCREASE KNOWLEDGE OF SELF CARE AFTER HOSPITALIZATION Description: Manage increase knowledge of self care after hospitalization with supervision from family using educational materials provided Outcome: Progressing

## 2023-10-28 NOTE — Progress Notes (Signed)
 PROGRESS NOTE   Subjective/Complaints:  Pt doing fine, slept well. Pain doing fine. LBM yesterday. Urinating fine. Denies any other complaints or concerns today.   ROS: as per HPI. Denies CP, SOB, abd pain, N/V/D/C, or any other complaints at this time.   + R hip/pelvis area pain    Objective:   DG HIP UNILAT WITH PELVIS 2-3 VIEWS RIGHT Result Date: 10/26/2023 CLINICAL DATA:  Right hip pain.  Known pelvic fracture. EXAM: DG HIP (WITH OR WITHOUT PELVIS) 2-3V RIGHT COMPARISON:  CT chest, abdomen, and pelvis 10/17/2023, AP pelvis 10/17/2023 FINDINGS: There is diffuse decreased bone mineralization. Redemonstration of comminuted fracture of the superior aspect of the left pubic body extending into the adjacent superior left pubic ramus with unchanged mild displacement of the cortical fragments. Again the nondisplaced fracture of the right sacral ala described on prior CT is not well visualized on radiographs. Mild bilateral sacroiliac subchondral sclerosis. IMPRESSION: 1. Redemonstration of comminuted fracture of the superior aspect of the left pubic body extending into the adjacent superior left pubic ramus with unchanged mild displacement of the cortical fragments. 2. Again the nondisplaced fracture of the right sacral ala described on prior CT is not well visualized on radiographs. Electronically Signed   By: Bertina Broccoli M.D.   On: 10/26/2023 19:02    No results for input(s): "WBC", "HGB", "HCT", "PLT" in the last 72 hours.  No results for input(s): "NA", "K", "CL", "CO2", "GLUCOSE", "BUN", "CREATININE", "CALCIUM " in the last 72 hours.   Urinalysis    Component Value Date/Time   COLORURINE AMBER (A) 10/25/2023 1539   APPEARANCEUR CLOUDY (A) 10/25/2023 1539   LABSPEC 1.017 10/25/2023 1539   LABSPEC 1.020 06/12/2014 1436   PHURINE 5.0 10/25/2023 1539   GLUCOSEU NEGATIVE 10/25/2023 1539   GLUCOSEU Negative 06/12/2014 1436   HGBUR  NEGATIVE 10/25/2023 1539   BILIRUBINUR NEGATIVE 10/25/2023 1539   BILIRUBINUR Negative 06/12/2014 1436   KETONESUR NEGATIVE 10/25/2023 1539   PROTEINUR NEGATIVE 10/25/2023 1539   UROBILINOGEN 0.2 06/12/2014 1436   NITRITE POSITIVE (A) 10/25/2023 1539   LEUKOCYTESUR LARGE (A) 10/25/2023 1539   LEUKOCYTESUR Negative 06/12/2014 1436    UCX: >=100,000 COLONIES/mL ESCHERICHIA COLI  40,000 COLONIES/mL PROTEUS MIRABILIS  SUSCEPTIBILITIES TO FOLLOW  NO GROUP B STREP (S.AGALACTIAE) ISOLATED     Intake/Output Summary (Last 24 hours) at 10/28/2023 1218 Last data filed at 10/28/2023 0818 Gross per 24 hour  Intake 720 ml  Output --  Net 720 ml        Physical Exam: Vital Signs Blood pressure (!) 141/57, pulse 81, temperature 98.1 F (36.7 C), temperature source Oral, resp. rate 18, height 5\' 8"  (1.727 m), SpO2 97%.    General: No apparent distress, resting in bed comfortably HEENT: Head is normocephalic, atraumatic, sclera anicteric, oral mucosa pink and moist Neck: Supple without JVD or lymphadenopathy Heart: Reg rate and rhythm. No murmurs rubs or gallops Chest: CTA bilaterally without wheezes, rales, or rhonchi; no distress Abdomen: Soft, non-tender, non-distended, bowel sounds positive.  Psych: Pt's affect is appropriate. Pt is cooperative Skin: Clean and intact without signs of breakdown  PRIOR EXAMS: GU: Dark cloudy urine in canister Musculoskeletal:  R greater trochanter with mild TTP, mild pain report with internal and external rotation R hip     Cervical back: Normal range of motion and neck supple.  Skin:    General: Skin is warm.     Findings: Bruising present.  Neuro:      Comments: Pt is awake and alert. Oriented to person and hospital, not year/month/day. Follows basic commands. Memory deficits present. Answers biographical questions, some times very matter-of-factly.  CN exam non-focal. MMT: 4+ to 5/5 bilateral upper ext. RLE 3/5 HF, KE and 4/5 ADF/PF> LLE  3-4/5 HF and 4/5 APF/ADF. Sensory exam normal for light touch and pain in all 4 limbs. No limb ataxia or cerebellar signs. No abnormal tone appreciated.         Assessment/Plan: 1. Functional deficits which require 3+ hours per day of interdisciplinary therapy in a comprehensive inpatient rehab setting. Physiatrist is providing close team supervision and 24 hour management of active medical problems listed below. Physiatrist and rehab team continue to assess barriers to discharge/monitor patient progress toward functional and medical goals  Care Tool:  Bathing    Body parts bathed by patient: Right arm, Chest, Left arm, Abdomen, Right upper leg, Left upper leg, Right lower leg, Left lower leg, Face   Body parts bathed by helper: Front perineal area, Buttocks     Bathing assist Assist Level: Minimal Assistance - Patient > 75%     Upper Body Dressing/Undressing Upper body dressing   What is the patient wearing?: Bra, Pull over shirt    Upper body assist Assist Level: Set up assist    Lower Body Dressing/Undressing Lower body dressing      What is the patient wearing?: Underwear/pull up, Pants     Lower body assist Assist for lower body dressing: Maximal Assistance - Patient 25 - 49%     Toileting Toileting    Toileting assist Assist for toileting: Maximal Assistance - Patient 25 - 49%     Transfers Chair/bed transfer  Transfers assist     Chair/bed transfer assist level: Moderate Assistance - Patient 50 - 74%     Locomotion Ambulation   Ambulation assist   Ambulation activity did not occur: Safety/medical concerns  Assist level: Minimal Assistance - Patient > 75% Assistive device: Walker-rolling Max distance: 20'   Walk 10 feet activity   Assist  Walk 10 feet activity did not occur: Safety/medical concerns  Assist level: Minimal Assistance - Patient > 75% Assistive device: Walker-rolling   Walk 50 feet activity   Assist Walk 50 feet with 2  turns activity did not occur: Safety/medical concerns         Walk 150 feet activity   Assist Walk 150 feet activity did not occur: Safety/medical concerns         Walk 10 feet on uneven surface  activity   Assist Walk 10 feet on uneven surfaces activity did not occur: Safety/medical concerns         Wheelchair     Assist Is the patient using a wheelchair?: Yes Type of Wheelchair: Manual    Wheelchair assist level: Dependent - Patient 0%      Wheelchair 50 feet with 2 turns activity    Assist        Assist Level: Dependent - Patient 0%   Wheelchair 150 feet activity     Assist      Assist Level: Dependent - Patient 0%   Blood pressure (!) 141/57, pulse 81, temperature 98.1 F (  36.7 C), temperature source Oral, resp. rate 18, height 5\' 8"  (1.727 m), SpO2 97%.  Medical Problem List and Plan: 1. Functional deficits secondary to left sacral ala and superior pubic rami fracture.  Weightbearing as tolerated per Dr. Guyann Leitz             -patient may shower -ELOS/Goals: 10-14 days, supervision goals with PT, and sup/min assist with OT and SLP- order placed  -Exp DC 5/6 2.  Antithrombotics: -DVT/anticoagulation:  Pharmaceutical: Lovenox  30mg  BID.  Check vascular study-negative              -antiplatelet therapy: Aspirin  81 mg daily 3. Pain Management: Lidoderm  patch, oxycodone  as needed  -4/24 Check xray pelvis/Hip right-- stable  -Muscle rub -10/27/23 xray stable; PT noting that she doesn't ask for PRNs, and mostly has pain during sessions; will schedule 500mg  tylenol  TID and see if that helps for sessions.  4. Mood/Behavior/Sleep: Namenda  10 mg twice daily, Cymbalta  60 mg twice daily             -antipsychotic agents: N/A 5. Neuropsych/cognition: This patient is not quite capable of making decisions on her own behalf. 6. Skin/Wound Care: Routine skin checks 7. Fluids/Electrolytes/Nutrition: Routine in and outs with follow-up chemistries, cont  vitamins/supplements 8.  Acute on chronic anemia/history of GI bleed/colon cancer.  Patient receives outpatient iron  infusions.  Continue iron  supplement.  Follow-up per hematology services Dr.Gorsuch  -4/22 HGB stable 8.3  -4/24 Stable at 8.5 9.  Epilepsy.  Tegretol  600 mg twice daily as well as zarontin  500 mg twice daily. 10.  CAD/CABG/history of stents/PPM.  Continue low-dose aspirin .  Follow-up cardiology service Dr. [place].  Continue Imdur  15 mg daily. Denies CP 11.  Hypothyroidism.  Synthroid  50mcg daily 12.  Hyponatremia.  Sodium chloride  tablets 1 g 3 times daily.  Follow-up chemistries tomorrow  -4/24 Up to 135  13.Hypertension. Norvasc  5 mg daily. Monitor with increased mobility             -bp borderline at present.  4/22 increase norvasc  to 7.5  4/24-25 fair control, continue current regimen and monitor  -4/26-27/25 BPs variable but fair, monitor.   Vitals:   10/25/23 0505 10/25/23 0919 10/25/23 1615 10/25/23 2000  BP: (!) 152/63 (!) 131/59 128/66 125/63   10/26/23 0610 10/26/23 0940 10/26/23 1552 10/26/23 1954  BP: 134/60 (!) 148/60 132/60 (!) 151/64   10/27/23 0311 10/27/23 1248 10/27/23 2010 10/28/23 0512  BP: (!) 145/68 (!) 103/90 (!) 163/65 (!) 141/57     14. Fibromyalgia   -Hx of fibromyalgia noted.   -Will start gabapentin  100mg  BID  -Increase gabapentin  to 100mg  TID  15. Urine incontinence - Discussed with husband reports that she did not have frequent incontinence until recent hospitalization  -4/24 Check UA and culture -10/27/23 U/A c/w UTI, started on keflex  250mg  QID on 10/25/23 PM, EOT 10/30/23 PM-- UCx pending, will follow -10/28/23 UCx with >100k CFU E.coli, 40k CFU proteus mirabilus, susceptibilities pending; cont keflex  for now, await susceptibilities  16. UTI  -Start Keflex  250mg  Q6h for 5 days, follow culture  -4/25 urine noted to be yellow clear today, monitor culture  -10/28/23 UCx with >100k CFU E.coli, 40k CFU proteus mirabilus, susceptibilities  pending; cont keflex  for now, await susceptibilities   17.  Hypercholesterolemia -Husband report he brought it Rapatha from home, last dose 4/13. Called pharmacy to discuss- will look into  18. Bowel function - 4/24 add Senokot S 2 tabs QHS, continue MiraLAX  twice daily, colace 100mg  BID  -  10/28/23 LBM yesterday  LOS: 6 days A FACE TO FACE EVALUATION WAS PERFORMED  [address] 10/28/2023, 12:18 PM

## 2023-10-29 DIAGNOSIS — S32391D Other fracture of right ilium, subsequent encounter for fracture with routine healing: Secondary | ICD-10-CM | POA: Diagnosis not present

## 2023-10-29 LAB — CBC
HCT: 26.8 % — ABNORMAL LOW (ref 36.0–46.0)
Hemoglobin: 8.6 g/dL — ABNORMAL LOW (ref 12.0–15.0)
MCH: 29.8 pg (ref 26.0–34.0)
MCHC: 32.1 g/dL (ref 30.0–36.0)
MCV: 92.7 fL (ref 80.0–100.0)
Platelets: 555 10*3/uL — ABNORMAL HIGH (ref 150–400)
RBC: 2.89 MIL/uL — ABNORMAL LOW (ref 3.87–5.11)
RDW: 22.3 % — ABNORMAL HIGH (ref 11.5–15.5)
WBC: 7.1 10*3/uL (ref 4.0–10.5)
nRBC: 0 % (ref 0.0–0.2)

## 2023-10-29 LAB — BASIC METABOLIC PANEL WITH GFR
Anion gap: 9 (ref 5–15)
BUN: 15 mg/dL (ref 8–23)
CO2: 25 mmol/L (ref 22–32)
Calcium: 8.4 mg/dL — ABNORMAL LOW (ref 8.9–10.3)
Chloride: 98 mmol/L (ref 98–111)
Creatinine, Ser: 0.67 mg/dL (ref 0.44–1.00)
GFR, Estimated: >60 mL/min (ref >60)
Glucose, Bld: 104 mg/dL — ABNORMAL HIGH (ref 70–99)
Potassium: 3.8 mmol/L (ref 3.5–5.1)
Sodium: 132 mmol/L — ABNORMAL LOW (ref 135–145)

## 2023-10-29 MED ORDER — TAMSULOSIN HCL 0.4 MG PO CAPS
0.4000 mg | ORAL_CAPSULE | Freq: Every day | ORAL | Status: DC
  Administered 2023-10-29: 0.4 mg via ORAL
  Filled 2023-10-29: qty 1

## 2023-10-29 MED ORDER — LIDOCAINE HCL URETHRAL/MUCOSAL 2 % EX GEL
1.0000 | CUTANEOUS | Status: DC | PRN
  Administered 2023-10-29 – 2023-11-02 (×3): 1 via URETHRAL
  Filled 2023-10-29 (×3): qty 6

## 2023-10-29 MED ORDER — LIDOCAINE HCL URETHRAL/MUCOSAL 2 % EX GEL
1.0000 | Freq: Every day | CUTANEOUS | Status: DC | PRN
Start: 2023-10-29 — End: 2023-10-29

## 2023-10-29 MED ORDER — OXYCODONE HCL 5 MG PO TABS
2.5000 mg | ORAL_TABLET | Freq: Four times a day (QID) | ORAL | Status: DC | PRN
  Administered 2023-10-29 – 2023-10-31 (×5): 5 mg via ORAL
  Filled 2023-10-29 (×4): qty 1

## 2023-10-29 MED ORDER — ACETAMINOPHEN 500 MG PO TABS
1000.0000 mg | ORAL_TABLET | Freq: Three times a day (TID) | ORAL | Status: DC
  Administered 2023-10-29 – 2023-11-06 (×25): 1000 mg via ORAL
  Filled 2023-10-29 (×25): qty 2

## 2023-10-29 MED ORDER — AMLODIPINE BESYLATE 10 MG PO TABS
10.0000 mg | ORAL_TABLET | Freq: Every day | ORAL | Status: DC
Start: 2023-10-30 — End: 2023-11-06
  Administered 2023-10-30 – 2023-11-06 (×8): 10 mg via ORAL
  Filled 2023-10-29 (×8): qty 1

## 2023-10-29 MED ORDER — OXYCODONE HCL 5 MG PO TABS
5.0000 mg | ORAL_TABLET | Freq: Three times a day (TID) | ORAL | Status: DC
  Administered 2023-10-29 – 2023-10-30 (×5): 5 mg via ORAL
  Filled 2023-10-29 (×5): qty 1

## 2023-10-29 NOTE — Progress Notes (Signed)
 Occupational Therapy Session Note  Patient Details  Name: Rachel Clark MRN: [id number] Date of Birth: [birth year]  Today's Date: 10/29/2023 OT Individual Time: 4132-4401 OT Individual Time Calculation (min): 64 min  and Today's Date: 10/29/2023 OT Missed Time: 10 Minutes Missed Time Reason: Patient fatigue   Short Term Goals: Week 1:  OT Short Term Goal 1 (Week 1): Pt will perform toilet transfer with consistent Min A + LRAD. OT Short Term Goal 2 (Week 1): Pt will thread LB garments with Min A + LRAD. OT Short Term Goal 3 (Week 1): Pt will complete 3/3 toileting activities with Mod A + LRAD.  Skilled Therapeutic Interventions/Progress Updates:    Pt received supine with no c/o pain, agreeable to OT session. She came to Eob with mod A to manage LB and trunk. She stood on a scale for accurate weight measurement from EOB, per nursing request, with min A. Weight 175.0. She completed functional mobility into the bathroom, 15 ft, with the RW with min A, CGA at multiple points in the transfer. She had incontinent urine void on brief and pants. She changed pants with mod A. She completed peri hygiene in standing with min A. She returned to the w/c with heavy encouragement, min A for 8 ft. She completed grooming tasks at the sink with set up assist. Pt was taken via w/c to the therapy gym for time management. She completed stair training, ascending/descending a single 8 in step with min A, heavy UE reliance on the B rails. She required heavy encouragement and cueing for leading with the LLE. X3 repetitions with extended rest break between. She returned to her room and was left supine with all needs met, bed alarm set. Husband and son present. 10 min missed d/t fatigue.   Therapy Documentation Precautions:  Precautions Precautions: Fall Recall of Precautions/Restrictions: Impaired Precaution/Restrictions Comments: WBAT, Dementia, PPM (watch BP) Restrictions Weight Bearing Restrictions Per  Provider Order: Yes RLE Weight Bearing Per Provider Order: Weight bearing as tolerated LLE Weight Bearing Per Provider Order: Weight bearing as tolerated    Therapy/Group: Individual Therapy  Una Ganser 10/29/2023, 6:18 AM

## 2023-10-29 NOTE — Progress Notes (Signed)
 Occupational Therapy Weekly Progress Note  Patient Details  Name: Rachel Clark MRN: [id number] Date of Birth: [birth year]  Beginning of progress report period: October 23, 2023 End of progress report period: October 30, 2023  {CHL IP REHAB OT TIME CALCULATIONS:304400400}   Patient has met {number 1-5:22450} of {number 1-5:20334} short term goals.  ***  Patient continues to demonstrate the following deficits: {impairments:3041632} and therefore will continue to benefit from skilled OT intervention to enhance overall performance with {ADL/iADL:3041649}.  Patient {LTG progression:3041653}.  {plan of NWGN:5621308}  OT Short Term Goals {OT MVH:8469629}  Skilled Therapeutic Interventions/Progress Updates:      Therapy Documentation Precautions:  Precautions Precautions: Fall Recall of Precautions/Restrictions: Impaired Precaution/Restrictions Comments: WBAT, Dementia, PPM (watch BP) Restrictions Weight Bearing Restrictions Per Provider Order: Yes RLE Weight Bearing Per Provider Order: Weight bearing as tolerated LLE Weight Bearing Per Provider Order: Weight bearing as tolerated   Therapy/Group: Individual Therapy  Artemus Biles, OTR/L, MSOT  10/29/2023, 9:12 PM

## 2023-10-29 NOTE — Progress Notes (Signed)
 PROGRESS NOTE   Subjective/Complaints:  No events overnight.  No acute complaints.  A.m. labs significant for mild hyponatremia 132, otherwise stable.  Significant hypertension overnight 160s to 170s.  Patient asymptomatic. To have 8 out of 10 hip pain overnight, worsened with laying flat in any position.  Also worse with mobilization, therapies.  Asking for something to improve pain control.  ROS: as per HPI. Denies CP, SOB, abd pain, N/V/D/C, or any other complaints at this time.   + R hip/pelvis area pain    Objective:   No results found.   Recent Labs    10/29/23 0541  WBC 7.1  HGB 8.6*  HCT 26.8*  PLT 555*    Recent Labs    10/29/23 0541  NA 132*  K 3.8  CL 98  CO2 25  GLUCOSE 104*  BUN 15  CREATININE 0.67  CALCIUM  8.4*      Intake/Output Summary (Last 24 hours) at 10/29/2023 0825 Last data filed at 10/29/2023 0820 Gross per 24 hour  Intake 717 ml  Output --  Net 717 ml        Physical Exam: Vital Signs Blood pressure (!) 160/71, pulse 79, temperature 97.9 F (36.6 C), resp. rate 16, height 5\' 8"  (1.727 m), SpO2 98%.    General: No apparent distress, laying in bed. HEENT: Head is normocephalic, atraumatic, sclera anicteric, oral mucosa pink and moist Neck: Supple without JVD or lymphadenopathy Heart: Reg rate and rhythm. No murmurs rubs or gallops Chest: CTA bilaterally without wheezes, rales, or rhonchi; no distress Abdomen: Soft, non-tender, non-distended, bowel sounds positive.  Psych: Pt's affect is appropriate. Pt is cooperative Skin: Clean and intact without signs of breakdown  Musculoskeletal:      R greater trochanter with mild TTP, mild pain report with internal and external rotation R hip--unchanged.       Neuro:   Awake, alert, and oriented x 4. No apparent cognitive deficits, reported mild memory deficits. Cranial nerve exam negative. Strength 4 out of 5 throughout,  limited by hip pain and right lower extremity with mobilization, cannot get antigravity today without significant pain. No obvious sensory deficits No ataxia No coordination deficits or tremor No tone        Assessment/Plan: 1. Functional deficits which require 3+ hours per day of interdisciplinary therapy in a comprehensive inpatient rehab setting. Physiatrist is providing close team supervision and 24 hour management of active medical problems listed below. Physiatrist and rehab team continue to assess barriers to discharge/monitor patient progress toward functional and medical goals  Care Tool:  Bathing    Body parts bathed by patient: Right arm, Chest, Left arm, Abdomen, Right upper leg, Left upper leg, Right lower leg, Left lower leg, Face   Body parts bathed by helper: Front perineal area, Buttocks     Bathing assist Assist Level: Minimal Assistance - Patient > 75%     Upper Body Dressing/Undressing Upper body dressing   What is the patient wearing?: Bra, Pull over shirt    Upper body assist Assist Level: Set up assist    Lower Body Dressing/Undressing Lower body dressing      What is the patient wearing?: Underwear/pull  up, Pants     Lower body assist Assist for lower body dressing: Maximal Assistance - Patient 25 - 49%     Toileting Toileting    Toileting assist Assist for toileting: Maximal Assistance - Patient 25 - 49%     Transfers Chair/bed transfer  Transfers assist     Chair/bed transfer assist level: Moderate Assistance - Patient 50 - 74%     Locomotion Ambulation   Ambulation assist   Ambulation activity did not occur: Safety/medical concerns  Assist level: Minimal Assistance - Patient > 75% Assistive device: Walker-rolling Max distance: 20'   Walk 10 feet activity   Assist  Walk 10 feet activity did not occur: Safety/medical concerns  Assist level: Minimal Assistance - Patient > 75% Assistive device: Walker-rolling   Walk  50 feet activity   Assist Walk 50 feet with 2 turns activity did not occur: Safety/medical concerns         Walk 150 feet activity   Assist Walk 150 feet activity did not occur: Safety/medical concerns         Walk 10 feet on uneven surface  activity   Assist Walk 10 feet on uneven surfaces activity did not occur: Safety/medical concerns         Wheelchair     Assist Is the patient using a wheelchair?: Yes Type of Wheelchair: Manual    Wheelchair assist level: Dependent - Patient 0%      Wheelchair 50 feet with 2 turns activity    Assist        Assist Level: Dependent - Patient 0%   Wheelchair 150 feet activity     Assist      Assist Level: Dependent - Patient 0%   Blood pressure (!) 160/71, pulse 79, temperature 97.9 F (36.6 C), resp. rate 16, height 5\' 8"  (1.727 m), SpO2 98%.  Medical Problem List and Plan: 1. Functional deficits secondary to left sacral ala and superior pubic rami fracture.  Weightbearing as tolerated per Dr. Guyann Leitz             -patient may shower -ELOS/Goals: 10-14 days, supervision goals with PT, and sup/min assist with OT and SLP- order placed  -Exp DC 5/6  2.  Antithrombotics: -DVT/anticoagulation:  Pharmaceutical: Lovenox  30mg  BID.  Check vascular study-negative              -antiplatelet therapy: Aspirin  81 mg daily 3. Pain Management: Lidoderm  patch, oxycodone  as needed  -4/24 Check xray pelvis/Hip right-- stable  -Muscle rub -10/27/23 xray stable; PT noting that she doesn't ask for PRNs, and mostly has pain during sessions; will schedule 500mg  tylenol  TID and see if that helps for sessions.  4-28: Ongoing 8 out of 10 pain overnight, with significant hypertension associated.  Increase Tylenol  to 1000 mg 3 times daily, schedule oxycodone  5 mg 3 times daily, reduce as needed oxycodone  to 2.5 to 5 mg every 6 hours as needed.  4. Mood/Behavior/Sleep: Namenda  10 mg twice daily, Cymbalta  60 mg twice daily              -antipsychotic agents: N/A 5. Neuropsych/cognition: This patient is not quite capable of making decisions on her own behalf. 6. Skin/Wound Care: Routine skin checks 7. Fluids/Electrolytes/Nutrition: Routine in and outs with follow-up chemistries, cont vitamins/supplements 8.  Acute on chronic anemia/history of GI bleed/colon cancer.  Patient receives outpatient iron  infusions.  Continue iron  supplement.  Follow-up per hematology services Dr.Gorsuch  -4/22 HGB stable 8.3  -4/24 Stable at 8.5 9.  Epilepsy.  Tegretol  600 mg twice daily as well as zarontin  500 mg twice daily. 10.  CAD/CABG/history of stents/PPM.  Continue low-dose aspirin .  Follow-up cardiology service Dr. [place].  Continue Imdur  15 mg daily. Denies CP 11.  Hypothyroidism.  Synthroid  50mcg daily 12.  Hyponatremia.  Sodium chloride  tablets 1 g 3 times daily.  Follow-up chemistries tomorrow  -4/24 Up to 135  4-28: 132 today, seems about baseline.  Monitor in 2 to 3 days.  13.Hypertension. Norvasc  5 mg daily. Monitor with increased mobility             -bp borderline at present.  4/22 increase norvasc  to 7.5  4/24-25 fair control, continue current regimen and monitor  -4/26-27/25 BPs variable but fair, monitor.   4-28: Significant hypertension overnight, likely associated with pain.  Monitor today with increased regimen as above.  Increase Norvasc  to 10 mg for tomorrow a.m.; benefit from rescheduling to nightly  Vitals:   10/26/23 0610 10/26/23 0940 10/26/23 1552 10/26/23 1954  BP: 134/60 (!) 148/60 132/60 (!) 151/64   10/27/23 0311 10/27/23 1248 10/27/23 2010 10/28/23 0512  BP: (!) 145/68 (!) 103/90 (!) 163/65 (!) 141/57   10/28/23 1310 10/28/23 1950 10/29/23 0435 10/29/23 0524  BP: (!) 129/53 (!) 162/70 (!) 175/70 (!) 160/71     14. Fibromyalgia   -Hx of fibromyalgia noted.   -Will start gabapentin  100mg  BID  -Increase gabapentin  to 100mg  TID  15. Urine incontinence/UTI - Discussed with husband reports that she did  not have frequent incontinence until recent hospitalization  -4/24 Check UA and culture -10/27/23 U/A c/w UTI, started on keflex  250mg  QID on 10/25/23 PM, EOT 10/30/23 PM-- UCx pending, will follow -10/28/23 UCx with >100k CFU E.coli, 40k CFU proteus mirabilus, susceptibilities pending; cont keflex  for now, await susceptibilities  4-28: Both species pansensitive, continue Keflex    16  Hypercholesterolemia -Husband report he brought it Rapatha from home, last dose 4/13. Called pharmacy to discuss- will look into  17. Bowel function - 4/24 add Senokot S 2 tabs QHS, continue MiraLAX  twice daily, colace 100mg  BID  -10/28/23 LBM yesterday  LOS: 7 days A FACE TO FACE EVALUATION WAS PERFORMED  Bea Lime 10/29/2023, 8:25 AM

## 2023-10-29 NOTE — Progress Notes (Signed)
 Occupational Therapy Session Note  Patient Details  Name: Rachel Clark MRN: [id number] Date of Birth: 07-08-1950  Today's Date: 10/29/2023 OT Individual Time: 4098-1191 OT Individual Time Calculation (min): 55 min    Short Term Goals: Week 1:  OT Short Term Goal 1 (Week 1): Pt will perform toilet transfer with consistent Min A + LRAD. OT Short Term Goal 2 (Week 1): Pt will thread LB garments with Min A + LRAD. OT Short Term Goal 3 (Week 1): Pt will complete 3/3 toileting activities with Mod A + LRAD.  Skilled Therapeutic Interventions/Progress Updates:  Skilled OT intervention completed with focus on ADL retraining, functional transfers, activity tolerance, re-orientation. Pt received upright in bed, asleep but easily aroused. Unrated pain reported in R hip and low back; nurse notified and present for pain meds. OT offered rest breaks, repositioning and applied topical heat with ACE wrap to R thigh (per pt's biggest area of complaint at the time).   Pt was pleasant throughout session, however frequently c/o pain with requests to lay back down. Pt improved participation when distracted from pain by discussing her family history and things familiar to her.   Transitioned > EOB with supervision though increased time and cues for sequencing/body positioning. Min A sit > stand using RW, then min A stand pivot with RW > w/c with pt prematurely sitting requiring assist for hips to w/c. Transported > bathroom for pain management. CGA sit > stand using grab bar, and min A stand pivot > BSC over toilet with max A to lower clothing. Pt incontinent of urinary void, but further continent of urinary void and small BM; charted. Pt frequently attempting to wipe while still voiding, requiring termination cues/distraction to allow urine to fully pass prior to pericare. Threaded new brief max A, pants with min A, then stood CGA using grab bar and required max A for posterior pericare and mod A for donning  clothing over hips. Min A stand pivot with grab bar > w/c with cues for BLE positioning.  Seated at sink, pt required cues for initiation, but able to brush teeth, wash face, doff/donn shirt and apply deodorant with supervision. CGA sit > stand using RW and min A short ambulatory transfer > EOB with pt again sitting prematurely requiring safe positioning of hips. Min A needed for sit > supine for RLE management.  Pt remained upright in bed, with bed alarm on/activated, and with all needs in reach at end of session.   Therapy Documentation Precautions:  Precautions Precautions: Fall Recall of Precautions/Restrictions: Impaired Precaution/Restrictions Comments: WBAT, Dementia, PPM (watch BP) Restrictions Weight Bearing Restrictions Per Provider Order: Yes RLE Weight Bearing Per Provider Order: Weight bearing as tolerated LLE Weight Bearing Per Provider Order: Weight bearing as tolerated    Therapy/Group: Individual Therapy  Ruthanna Covert, MS, OTR/L  10/29/2023, 10:15 AM

## 2023-10-29 NOTE — Progress Notes (Signed)
 Weight obtained and entered in the flowsheet.   Randeen Busman, LPN

## 2023-10-29 NOTE — Plan of Care (Signed)
  Problem: Consults Goal: RH GENERAL PATIENT EDUCATION Description: See Patient Education module for education specifics. Outcome: Progressing   Problem: RH BOWEL ELIMINATION Goal: RH STG MANAGE BOWEL WITH ASSISTANCE Description: STG Manage Bowel with supervision Assistance. Outcome: Progressing   Problem: RH BLADDER ELIMINATION Goal: RH STG MANAGE BLADDER WITH ASSISTANCE Description: STG Manage Bladder With supervision  Assistance Outcome: Progressing   Problem: RH SKIN INTEGRITY Goal: RH STG SKIN FREE OF INFECTION/BREAKDOWN Description: Manage skin free of infection/breakdown with supervision assistance Outcome: Progressing   Problem: RH SAFETY Goal: RH STG ADHERE TO SAFETY PRECAUTIONS W/ASSISTANCE/DEVICE Description: STG Adhere to Safety Precautions With  supervision Assistance/Device. Outcome: Progressing   Problem: RH KNOWLEDGE DEFICIT GENERAL Goal: RH STG INCREASE KNOWLEDGE OF SELF CARE AFTER HOSPITALIZATION Description: Manage increase knowledge of self care after hospitalization with supervision from family using educational materials provided Outcome: Progressing   Problem: RH PAIN MANAGEMENT Goal: RH STG PAIN MANAGED AT OR BELOW PT'S PAIN GOAL Description: <4 w/ prns Outcome: Progressing

## 2023-10-29 NOTE — Progress Notes (Signed)
 Occupational Therapy Session Note  Patient Details  Name: Rachel Clark MRN: [id number] Date of Birth: 13-Jan-1951  Today's Date: 10/29/2023 OT Individual Time: 1030-1130 OT Individual Time Calculation (min): 60 min    Short Term Goals: Week 1:  OT Short Term Goal 1 (Week 1): Pt will perform toilet transfer with consistent Min A + LRAD. OT Short Term Goal 2 (Week 1): Pt will thread LB garments with Min A + LRAD. OT Short Term Goal 3 (Week 1): Pt will complete 3/3 toileting activities with Mod A + LRAD.      Skilled Therapeutic Interventions/Progress Updates:    Pt received in bed with spouse and son present. Pt alert and able to engage well in session. Suggested to pt she try to toilet. Pt sat to EOB with min A . C/o R thigh pain. (Hot packs removed and RN contacted for pain meds. )  pt able to sit to stand from elevated bed to RW with CGA.  She would try to sit suddenly.. cued pt that to safely move to wc she would need to stay standing for at least 6 steps to chair and then I would cue her when safe to sit).  With demonstration, pt able to repeat demonstrate. Pt moved from bed to w/c with min A with stand pivot.    Pt then transported to bathroom in which she repeated the transfer to the toilet with RW with min A.  Pt did self cleanse with cues to keep repeating with new cloths as she had a medium sticky BM. I completed the cleansing.  Pt able to sit forward on toilet to reach back side but ultimately needed to stand.   (Nursing documented BM in flow sheets). Pt stood a total of 4x from toilet with close S using grab bar during toileting / LB dressing process. Needed to change her pants with mod A overall.  Transferred back to wc.  Pt needs frequent encouragement but overall improved participation.  Resting in wc with telesitter on and family present.   Therapy Documentation Precautions:  Precautions Precautions: Fall Recall of Precautions/Restrictions: Impaired Precaution/Restrictions  Comments: WBAT, Dementia, PPM (watch BP) Restrictions Weight Bearing Restrictions Per Provider Order: Yes RLE Weight Bearing Per Provider Order: Weight bearing as tolerated LLE Weight Bearing Per Provider Order: Weight bearing as tolerated Therapy Vitals Temp: 97.7 F (36.5 C) Temp Source: Oral Pulse Rate: 87 Resp: 16 BP: (!) 142/72 Patient Position (if appropriate): Lying Oxygen Therapy SpO2: 99 % O2 Device: Room Air Pain: Pain Assessment Pain Scale: Faces Pain Location: Pelvis Pain Intervention(s): Medication (See eMAR) ADL: ADL Eating: Set up Where Assessed-Eating: Bed level Upper Body Bathing: Supervision/safety, Setup Where Assessed-Upper Body Bathing: Edge of bed Lower Body Bathing: Minimal assistance, Moderate assistance Upper Body Dressing: Supervision/safety, Setup Where Assessed-Upper Body Dressing: Edge of bed Lower Body Dressing: Minimal assistance, Moderate assistance Where Assessed-Lower Body Dressing: Edge of bed Toileting: Maximal assistance Where Assessed-Toileting: Bedside Commode Toilet Transfer: Moderate assistance Toilet Transfer Method: Stand pivot Toilet Transfer Equipment: Bedside commode Tub/Shower Transfer: Unable to assess Film/video editor Method: Unable to assess   Therapy/Group: Individual Therapy  Orange Hilligoss 10/29/2023, 2:22 PM

## 2023-10-30 DIAGNOSIS — S32391D Other fracture of right ilium, subsequent encounter for fracture with routine healing: Secondary | ICD-10-CM | POA: Diagnosis not present

## 2023-10-30 MED ORDER — TAMSULOSIN HCL 0.4 MG PO CAPS
0.4000 mg | ORAL_CAPSULE | Freq: Every day | ORAL | Status: DC
  Administered 2023-10-30: 0.4 mg via ORAL
  Filled 2023-10-30: qty 1

## 2023-10-30 NOTE — Progress Notes (Addendum)
 Patient was previously voiding on her own now requiring I/O cath at the moment. Notified MD face to face. Will continue toileting/bladder program and encouraging fluids.  Reinforced education with patient and husband on bladder training/bladder program. Patient and husband Verbalized understanding.   Randeen Busman, LPN

## 2023-10-30 NOTE — Progress Notes (Signed)
 Physical Therapy Session Note  Patient Details  Name: Rachel Clark MRN: [id number] Date of Birth: 1950/12/10  Today's Date: 10/30/2023 PT Individual Time: 1345-1415 PT Individual Time Calculation (min): 30 min   Short Term Goals: Week 1:  PT Short Term Goal 1 (Week 1): Pt will complete bed mobility with min assist PT Short Term Goal 2 (Week 1): Pt will complete bed to [facility] transfers with min assist and LRAD PT Short Term Goal 3 (Week 1): Pt will ambulates with RW with mod assist 50 with +2 WC follow PT Short Term Goal 4 (Week 1): Pt will complete up/down 1 6" step with BHRs  Skilled Therapeutic Interventions/Progress Updates: Pt presents supine in bed , finishing w/ nursing care and skeptical of PT.  PT took time to improve rapport' w/ spouse and son in room.  Pt agreeable to transfer to EOB w/ CGA 2/2 pain RLE/pelvis.  Pt able to don shoes w/ set-up.  Pt transfers sit to stand w/ min A and encouragement.  Pt stood x 2' before wanting to sit, unable to encourage increased time.  Pt required cues for hand placement.  Pt then requested to lie down, requiring min A for RLE.  Pt then agreeable to perform 1 more sit to stand .  Pt side-stepped to [facility] w/ min A and RW.  Pt returned to supine w/ min A for RLE.  Bed alarm on and all needs in reach, spouse and son present in room throughout session.     Therapy Documentation Precautions:  Precautions Precautions: Fall Recall of Precautions/Restrictions: Impaired Precaution/Restrictions Comments: WBAT, Dementia, PPM (watch BP) Restrictions Weight Bearing Restrictions Per Provider Order: Yes RLE Weight Bearing Per Provider Order: Weight bearing as tolerated LLE Weight Bearing Per Provider Order: Weight bearing as tolerated General:   Vital Signs:   Pain:pt unable to quantify other than hurts a whole lot. Pain Assessment Pain Scale: Faces Faces Pain Scale: Hurts whole lot Pain Type: Acute pain Pain Location: Pelvis Pain Intervention(s):  Medication (See eMAR);Hot/Cold interventions     Therapy/Group: Individual Therapy  [name] 10/30/2023, 2:18 PM

## 2023-10-30 NOTE — Progress Notes (Signed)
 Encouraged oral intake, extra fluids given with med pass.  Randeen Busman, LPN

## 2023-10-30 NOTE — Plan of Care (Signed)
  Problem: RH BOWEL ELIMINATION Goal: RH STG MANAGE BOWEL WITH ASSISTANCE Description: STG Manage Bowel with supervision Assistance. Outcome: Progressing   Problem: RH BLADDER ELIMINATION Goal: RH STG MANAGE BLADDER WITH ASSISTANCE Description: STG Manage Bladder With supervision  Assistance Outcome: Progressing   Problem: RH SKIN INTEGRITY Goal: RH STG SKIN FREE OF INFECTION/BREAKDOWN Description: Manage skin free of infection/breakdown with supervision assistance Outcome: Progressing   Problem: RH SAFETY Goal: RH STG ADHERE TO SAFETY PRECAUTIONS W/ASSISTANCE/DEVICE Description: STG Adhere to Safety Precautions With  supervision Assistance/Device. Outcome: Progressing   Problem: RH PAIN MANAGEMENT Goal: RH STG PAIN MANAGED AT OR BELOW PT'S PAIN GOAL Description: <4 w/ prns Outcome: Progressing   Problem: RH KNOWLEDGE DEFICIT GENERAL Goal: RH STG INCREASE KNOWLEDGE OF SELF CARE AFTER HOSPITALIZATION Description: Manage increase knowledge of self care after hospitalization with supervision from family using educational materials provided Outcome: Progressing

## 2023-10-30 NOTE — Progress Notes (Signed)
 Occupational Therapy Session Note  Patient Details  Name: Rachel Clark MRN: [id number] Date of Birth: Nov 01, 1950  Today's Date: 10/30/2023 OT Individual Time: 9528-4132 OT Individual Time Calculation (min): 53 min    Short Term Goals: Week 1:  OT Short Term Goal 1 (Week 1): Pt will perform toilet transfer with consistent Min A + LRAD. OT Short Term Goal 2 (Week 1): Pt will thread LB garments with Min A + LRAD. OT Short Term Goal 3 (Week 1): Pt will complete 3/3 toileting activities with Mod A + LRAD.  Skilled Therapeutic Interventions/Progress Updates:     Pt received deeply sleeping in bed, waking to OT calling out Pt's name, however requiring significantly increased amount of time to initiate participation in skilled OT session. Pt presenting to be confused with gentle education and reorientation provided- Pt unable to recall current location, date, or reason for hospitalization this session. Pt receptive to skilled OT session with mod motivation required reporting 9/10 pain in pelvis- OT offering intermittent rest breaks, repositioning, and therapeutic support to optimize participation in therapy session. RN informed of Pt's pain and in/out to provide pain and morning medications. Focused this session on BADL retraining, functional transfers, activity tolerance, and re-orientation.   Offered to assist Pt to bathroom to adhere to timed toileting schedule with Pt initially declining, however with maximal encouragement through collaboration with RN, Pt eventually was receptive. Pt was able to transition from supine > EOB with HOB elevated with CGA +increased time. Pt perseverating on pain and frequently requesting to return to bed. Engaged Pt in light hearted conversation to distract away  from pain and offered to use wc to get to bathroom vs walking with improved participation following.   U/LB bathing completed sitting EOB. Pt able to donn shirt with set-up A. Pt initiated weaving B LEs  into pants, however max A required to weave L LE into pants d/t pants. Pt stood with min A form EOB to RW and brought pants to waist with min A required for standing balance. Stand pivot EOB > wc using RW min A for balance and verbal cues for sequencing and body positioning. Transported Pt total A to bathroom in wc for pain management. Stand pivot wc > elevated toilet seat positioned over toilet min A. Pt able to doff pants from waist with min A for balance and verbal cues for technique and motivation. Increased time provided on toilet, however no void at this time with RN notified. Encouraged Pt to drink fluids with pt able to drink entire 8 oz water during session. Pt Stood from elevated toilet seat using RW min A and brought pants to waist with assistance required to fully adjust pants over buttocks.   Positioned Pt at sink in wc for grooming/hygiene tasks- able to wash face, groom hair, and brush teeth with min cues required for task initiation.   Pt requesting to return to bed at end of session d/t fatigue and pain. Stand pivot to L using RW min A for balance/RW management and verbal cues for L LE foot placement. EOB > supine CGA. Pt was left resting in bed with call bell in reach, bed alarm on, telesitter on, and all needs met.    Therapy Documentation Precautions:  Precautions Precautions: Fall Recall of Precautions/Restrictions: Impaired Precaution/Restrictions Comments: WBAT, Dementia, PPM (watch BP) Restrictions Weight Bearing Restrictions Per Provider Order: Yes RLE Weight Bearing Per Provider Order: Weight bearing as tolerated LLE Weight Bearing Per Provider Order: Weight bearing as tolerated  Therapy/Group: Individual Therapy  Geoffery Kiel 10/30/2023, 7:48 AM

## 2023-10-30 NOTE — Progress Notes (Signed)
 Physical Therapy Session Note  Patient Details  Name: Rachel Clark MRN: [id number] Date of Birth: [birth year]  Today's Date: 10/30/2023 PT Individual Time: 0865-7846 PT Individual Time Calculation (min): 40 min   Short Term Goals: Week 1:  PT Short Term Goal 1 (Week 1): Pt will complete bed mobility with min assist PT Short Term Goal 2 (Week 1): Pt will complete bed to [facility] transfers with min assist and LRAD PT Short Term Goal 3 (Week 1): Pt will ambulates with RW with mod assist 50 with +2 WC follow PT Short Term Goal 4 (Week 1): Pt will complete up/down 1 6" step with BHRs Week 2:     Skilled Therapeutic Interventions/Progress Updates:      Pt received returning to bed from [facility] with nursing upon arrival. Pt agreeable to therapy. Pt reports 10/10 pain in B hip and back. Therapist donned ice to R hip, and notified nurse, nurse donned lidocaine  patch and administered pain medication towards end of session.   Pt sitting EOB at start of session, pt performed R LE LAQ 1x10 LAQ prior to insisting she needed to lay down 2/2 pain.   Pt performed the following exercise for B LE strengthening pain management, healing 1x10 active assisted SLR B, verbal cues provided for initiation.  1x10 glute sets holding for 5"    Pt then reports 4/10 pain, and agreeable to sitting EOB. Pt performed sit to stand from elevated bed with RW and min A, pt only able to stand for a few seconds prior to needing seated rest break --pt reporting 8/10 pain and need to lay back down.   Pt performed L sidelying to sit with CGA for management of trunk , and sit to supine with mod A for managament of B LE, verbal cues provided for technique.   Pt sat up EOB to take medicine, pt then performed 1x10 L LE LAQ, and 1x2 heel/toe raises prior to insisting she needed to lay down 2/2 pain and nausea. Notified nursing.   Pt supine in bed with all needs within reach and bed alarm on.   Therapy Documentation Precautions:   Precautions Precautions: Fall Recall of Precautions/Restrictions: Impaired Precaution/Restrictions Comments: WBAT, Dementia, PPM (watch BP) Restrictions Weight Bearing Restrictions Per Provider Order: Yes RLE Weight Bearing Per Provider Order: Weight bearing as tolerated LLE Weight Bearing Per Provider Order: Weight bearing as tolerated  Therapy/Group: Individual Therapy  [facility] [place], Willis, DPT  10/30/2023, 7:52 AM

## 2023-10-30 NOTE — Plan of Care (Signed)
 Downgraded 4/29 due to current patient functioning and plan for SNF discharge.   Problem: RH Balance Goal: LTG Patient will maintain dynamic standing with ADLs (OT) Description: LTG:  Patient will maintain dynamic standing balance with assist during activities of daily living (OT)  Flowsheets (Taken 10/30/2023 1524) LTG: Pt will maintain dynamic standing balance during ADLs with: Minimal Assistance - Patient > 75%   Problem: Sit to Stand Goal: LTG:  Patient will perform sit to stand in prep for activites of daily living with assistance level (OT) Description: LTG:  Patient will perform sit to stand in prep for activites of daily living with assistance level (OT) Flowsheets (Taken 10/30/2023 1524) LTG: PT will perform sit to stand in prep for activites of daily living with assistance level: Contact Guard/Touching assist   Problem: RH Bathing Goal: LTG Patient will bathe all body parts with assist levels (OT) Description: LTG: Patient will bathe all body parts with assist levels (OT) Flowsheets (Taken 10/30/2023 1524) LTG: Pt will perform bathing with assistance level/cueing: Minimal Assistance - Patient > 75%   Problem: RH Dressing Goal: LTG Patient will perform lower body dressing w/assist (OT) Description: LTG: Patient will perform lower body dressing with assist, with/without cues in positioning using equipment (OT) Flowsheets (Taken 10/30/2023 1524) LTG: Pt will perform lower body dressing with assistance level of: Minimal Assistance - Patient > 75%   Problem: RH Toileting Goal: LTG Patient will perform toileting task (3/3 steps) with assistance level (OT) Description: LTG: Patient will perform toileting task (3/3 steps) with assistance level (OT)  Flowsheets (Taken 10/30/2023 1524) LTG: Pt will perform toileting task (3/3 steps) with assistance level: Minimal Assistance - Patient > 75%   Problem: RH Toilet Transfers Goal: LTG Patient will perform toilet transfers w/assist  (OT) Description: LTG: Patient will perform toilet transfers with assist, with/without cues using equipment (OT) Flowsheets (Taken 10/30/2023 1524) LTG: Pt will perform toilet transfers with assistance level of: Contact Guard/Touching assist   Problem: RH Tub/Shower Transfers Goal: LTG Patient will perform tub/shower transfers w/assist (OT) Description: LTG: Patient will perform tub/shower transfers with assist, with/without cues using equipment (OT) Flowsheets (Taken 10/30/2023 1524) LTG: Pt will perform tub/shower stall transfers with assistance level of: Minimal Assistance - Patient > 75%   Problem: RH Memory Goal: LTG Patient will demonstrate ability for day to day recall/carry over during activities of daily living with assistance level (OT) Description: LTG:  Patient will demonstrate ability for day to day recall/carry over during activities of daily living with assistance level (OT). Flowsheets (Taken 10/30/2023 1524) LTG:  Patient will demonstrate ability for day to day recall/carry over during activities of daily living with assistance level (OT): Minimal Assistance - Patient > 75%

## 2023-10-30 NOTE — Progress Notes (Signed)
 Occupational Therapy Session Note  Patient Details  Name: Rachel Clark MRN: [id number] Date of Birth: 1950/11/05  Today's Date: 10/30/2023 OT Individual Time: 1038-1100 OT Individual Time Calculation (min): 22 min  and Today's Date: 10/30/2023 OT Missed Time: 8 Minutes Missed Time Reason: Patient unwilling/refused to participate without medical reason   Short Term Goals: Week 1:  OT Short Term Goal 1 (Week 1): Pt will perform toilet transfer with consistent Min A + LRAD. OT Short Term Goal 2 (Week 1): Pt will thread LB garments with Min A + LRAD. OT Short Term Goal 3 (Week 1): Pt will complete 3/3 toileting activities with Mod A + LRAD.  Skilled Therapeutic Interventions/Progress Updates:    Pt received supine with c/o a HA and L hip pain- unrated. Initially was very resistant to all intervention from OT, shouting "just leave me alone for once". Discussed with LPN who reported she needs to try and use the bathroom to avoid another I/O cath. Re-entered room with LPN after several minutes and pt was more agreeable to try and get to [facility]. She came to EOB with min A. Stand pivot transfer with min A using RW. She voided small amount of BM, drop of urine. She was perseverative on pain and getting back to bed. She was redirected to attempt urine void. OT left room with pt on [facility] and LPN present.   Therapy Documentation Precautions:  Precautions Precautions: Fall Recall of Precautions/Restrictions: Impaired Precaution/Restrictions Comments: WBAT, Dementia, PPM (watch BP) Restrictions Weight Bearing Restrictions Per Provider Order: Yes RLE Weight Bearing Per Provider Order: Weight bearing as tolerated LLE Weight Bearing Per Provider Order: Weight bearing as tolerated  Therapy/Group: Individual Therapy  Una Ganser 10/30/2023, 11:36 AM

## 2023-10-30 NOTE — Patient Care Conference (Signed)
 Inpatient RehabilitationTeam Conference and Plan of Care Update Date: 10/30/2023   Time: 1100 am    Patient Name: Rachel Clark      Medical Record Number: [id number]  Date of Birth: [birth year] Sex: Female         Room/Bed: 4W15C/4W15C-01 Payor Info: Payor: MEDICARE / Plan: MEDICARE PART A AND B / Product Type: *No Product type* /    Admit Date/Time:  10/22/2023  3:32 PM  Primary Diagnosis:  Pelvic fracture [facility])  Hospital Problems: Principal Problem:   Pelvic fracture [facility])    Expected Discharge Date: Expected Discharge Date:  (SNF pending)  Team Members Present: Physician leading conference: Dr. Cherri Corns Social Worker Present: Norval Been, LCSWA Nurse Present: Jerene Monks, RN PT Present: Theodis Fiscal, PT OT Present: Artemus Biles, OT SLP Present: Other (comment) Tally Faes, SLP) PPS Coordinator present : Jestine Moron, SLP     Current Status/Progress Goal Weekly Team Focus  Bowel/Bladder   Patient is incontinent of bowel and bladder, In and out cath  PRN q6 if bladder scan is >350   Patient will regain continence   Timed toiletting, assess q shift    Swallow/Nutrition/ Hydration               ADL's   Setup/Supervision for UB ADLs & Mod-Max A for LB ADLs/toileting. Barriers: Increased pain, decreased activity tolerance, decreased strength, and B/B incontinence.   CGA overall, plan to downgrade as appropriate   Functional transfers, functional reach for LB care, general conditioning    Mobility   supervision bed mobility, minA/modA transfers, minA gait x20' with RW   supervision, need to downgrade  increased participation, transfers, gait, family ed    Communication                Safety/Cognition/ Behavioral Observations               Pain   Patient shows facial grimaces, moaning, groaning during repositioning ang getting in and out of bed due to pelvic fracture.   Patient will be pain free   Will assess q shift and PRN     Skin   Scatterred bruises   Maintain shin integrity  Will assess q shift and PRN      Discharge Planning:  Pt will d/c to home with husband who is unable to physically lift her. SW will confim there are no barriers to discharge.    Team Discussion: Patient was admitted post left sacral ala and superior pubic rami fracture due to fall at home. Patient has urinary tract infection, pain on right lower extremity, fluctuating blood pressures : medications adjusted by MD. Patient limited by decreased activity tolerance and dementia.   Patient on target to meet rehab goals: No,Patient requires supervision with upper body care, max assistance for lower body care and max assistance with transfers. Patient is able to ambulate up tp 20' with minimal assistance using a rolling walker. Overall goals were downgraded.   *See Care Plan and progress notes for long and short-term goals.   Revisions to Treatment Plan:  Downgraded goals   Time toileting  Teaching Needs: Safety, transfers, toileting, medications, etc.   Current Barriers to Discharge: Decreased caregiver support and Behavior  Possible Resolutions to Barriers: SNF placement     Medical Summary Current Status: Medically complicated by left sacral alla and superior pubic rami fracture, poor pain control, urinary retention, UTI, dementia, epilepsy, hypertension, hyponatremia, and anemia  Barriers to Discharge: Electrolyte abnormality;Medical stability;Self-care education;Symptomatic Anemia;Incontinence;Uncontrolled  Pain   Possible Resolutions to [employer]: Titrate pain medications to minimal tolerable dosing, every 6 hour PVR/bladder scans, monitor for electrolyte abnormalities, promote sleep/wake cycle   Continued Need for Acute Rehabilitation Level of Care: The patient requires daily medical management by a physician with specialized training in physical medicine and rehabilitation for the following reasons: Direction  of a multidisciplinary physical rehabilitation program to maximize functional independence : Yes Medical management of patient stability for increased activity during participation in an intensive rehabilitation regime.: Yes Analysis of laboratory values and/or radiology reports with any subsequent need for medication adjustment and/or medical intervention. : Yes   I attest that I was present, lead the team conference, and concur with the assessment and plan of the team.   Jerene Monks 10/30/2023, 1100 am

## 2023-10-30 NOTE — Progress Notes (Signed)
 PROGRESS NOTE   Subjective/Complaints:  No events overnight.  No acute complaints.  Continues to have pain in her right lower extremity, does think that increased pain regimen has helped her tolerate weightbearing a little bit better. Vital stable, blood pressure slightly improved from yesterday, now 140s over 60s. Required multiple large-volume straight caths overnight for urination.  Patient denies any dysuria, urgency, incontinence, or difficulty voiding.   Incontinent bowel movement yesterday.  ROS: Limited due to cognition.. Denies CP, SOB, abd pain, N/V/D/C, or any other complaints at this time.   + R hip/pelvis area pain + Incontinence   Objective:   No results found.   Recent Labs    10/29/23 0541  WBC 7.1  HGB 8.6*  HCT 26.8*  PLT 555*    Recent Labs    10/29/23 0541  NA 132*  K 3.8  CL 98  CO2 25  GLUCOSE 104*  BUN 15  CREATININE 0.67  CALCIUM  8.4*      Intake/Output Summary (Last 24 hours) at 10/30/2023 0948 Last data filed at 10/30/2023 0700 Gross per 24 hour  Intake 717 ml  Output 2512 ml  Net -1795 ml        Physical Exam: Vital Signs Blood pressure (!) 142/64, pulse 95, temperature 97.8 F (36.6 C), temperature source Oral, resp. rate 18, height 5\' 8"  (1.727 m), weight 79.4 kg, SpO2 97%.    General: No apparent distress, laying in bed. HEENT: Head is normocephalic, atraumatic, sclera anicteric, oral mucosa pink and moist Neck: Supple without JVD or lymphadenopathy Heart: Reg rate and rhythm. No murmurs rubs or gallops Chest: CTA bilaterally without wheezes, rales, or rhonchi; no distress Abdomen: Soft, non-tender, non-distended, bowel sounds positive.  Psych: Pt's affect is appropriate. Pt is cooperative Skin: Clean and intact without signs of breakdown  Musculoskeletal:      R greater trochanter and lateral thigh TTP, no apparent edema or deformity.  Exquisite tenderness with  internal or external rotation of the thigh.  Can get antigravity independently with some discomfort.      Neuro:   Awake, alert, and oriented x 4. Moderate memory and cognitive deficits, poor insight into current condition.  Cranial nerve exam negative. Strength 4 out of 5 throughout-except 3 out of 5 right hip flexor due to pain No obvious sensory deficits No ataxia No coordination deficits or tremor No tone        Assessment/Plan: 1. Functional deficits which require 3+ hours per day of interdisciplinary therapy in a comprehensive inpatient rehab setting. Physiatrist is providing close team supervision and 24 hour management of active medical problems listed below. Physiatrist and rehab team continue to assess barriers to discharge/monitor patient progress toward functional and medical goals  Care Tool:  Bathing    Body parts bathed by patient: Right arm, Chest, Left arm, Abdomen, Right upper leg, Left upper leg, Right lower leg, Left lower leg, Face   Body parts bathed by helper: Front perineal area, Buttocks     Bathing assist Assist Level: Minimal Assistance - Patient > 75%     Upper Body Dressing/Undressing Upper body dressing   What is the patient wearing?: Bra, Pull over shirt  Upper body assist Assist Level: Set up assist    Lower Body Dressing/Undressing Lower body dressing      What is the patient wearing?: Underwear/pull up, Pants     Lower body assist Assist for lower body dressing: Maximal Assistance - Patient 25 - 49%     Toileting Toileting    Toileting assist Assist for toileting: Maximal Assistance - Patient 25 - 49%     Transfers Chair/bed transfer  Transfers assist     Chair/bed transfer assist level: Moderate Assistance - Patient 50 - 74%     Locomotion Ambulation   Ambulation assist   Ambulation activity did not occur: Safety/medical concerns  Assist level: Minimal Assistance - Patient > 75% Assistive device:  Walker-rolling Max distance: 20'   Walk 10 feet activity   Assist  Walk 10 feet activity did not occur: Safety/medical concerns  Assist level: Minimal Assistance - Patient > 75% Assistive device: Walker-rolling   Walk 50 feet activity   Assist Walk 50 feet with 2 turns activity did not occur: Safety/medical concerns         Walk 150 feet activity   Assist Walk 150 feet activity did not occur: Safety/medical concerns         Walk 10 feet on uneven surface  activity   Assist Walk 10 feet on uneven surfaces activity did not occur: Safety/medical concerns         Wheelchair     Assist Is the patient using a wheelchair?: Yes Type of Wheelchair: Manual    Wheelchair assist level: Dependent - Patient 0%      Wheelchair 50 feet with 2 turns activity    Assist        Assist Level: Dependent - Patient 0%   Wheelchair 150 feet activity     Assist      Assist Level: Dependent - Patient 0%   Blood pressure (!) 142/64, pulse 95, temperature 97.8 F (36.6 C), temperature source Oral, resp. rate 18, height 5\' 8"  (1.727 m), weight 79.4 kg, SpO2 97%.  Medical Problem List and Plan: 1. Functional deficits secondary to left sacral ala and superior pubic rami fracture.  Weightbearing as tolerated per Dr. Guyann Leitz             -patient may shower -ELOS/Goals: 10-14 days, supervision goals with PT, and sup/min assist with OT and SLP-dispo to SNF    4/29: Setup-SPV UB, Md-Max A LBD. Needing a LOT of encouragement for standing tasks and transfers - cog is limiting. Husband not safe to assist - unstable with practicing with PT/OT. Son not participating in self-care activitied but can assist some. Plan for dispo to SNF  2.  Antithrombotics: -DVT/anticoagulation:  Pharmaceutical: Lovenox  30mg  BID.  Check vascular study-negative              -antiplatelet therapy: Aspirin  81 mg daily 3. Pain Management: Lidoderm  patch, oxycodone  as needed  -4/24 Check xray  pelvis/Hip right-- stable  -Muscle rub -10/27/23 xray stable; PT noting that she doesn't ask for PRNs, and mostly has pain during sessions; will schedule 500mg  tylenol  TID and see if that helps for sessions.  4-28: Ongoing 8 out of 10 pain overnight, with significant hypertension associated.  Increase Tylenol  to 1000 mg 3 times daily, schedule oxycodone  5 mg 3 times daily, reduce as needed oxycodone  to 2.5 to 5 mg every 6 hours as needed. 4-29: Does feel pain is improved, patient with poor insight about quality of pain and changes.  Continue  current regimen, hesitant to further increase due to cognitive deficits.  4. Mood/Behavior/Sleep: Namenda  10 mg twice daily, Cymbalta  60 mg twice daily             -antipsychotic agents: N/A 5. Neuropsych/cognition: This patient is not quite capable of making decisions on her own behalf. 6. Skin/Wound Care: Routine skin checks 7. Fluids/Electrolytes/Nutrition: Routine in and outs with follow-up chemistries, cont vitamins/supplements 8.  Acute on chronic anemia/history of GI bleed/colon cancer.  Patient receives outpatient iron  infusions.  Continue iron  supplement.  Follow-up per hematology services Dr.Gorsuch  -4/22 HGB stable 8.3  - Stable at 8-9  9.  Epilepsy.  Tegretol  600 mg twice daily as well as zarontin  500 mg twice daily. 10.  CAD/CABG/history of stents/PPM.  Continue low-dose aspirin .  Follow-up cardiology service Dr. [place].  Continue Imdur  15 mg daily. Denies CP 11.  Hypothyroidism.  Synthroid  50mcg daily 12.  Hyponatremia.  Sodium chloride  tablets 1 g 3 times daily.  Follow-up chemistries tomorrow  -4/24 Up to 135  4-28: 132 today, seems about baseline.  Monitor in 2 to 3 days.  13.Hypertension. Norvasc  5 mg daily. Monitor with increased mobility             -bp borderline at present.  4/22 increase norvasc  to 7.5  4/24-25 fair control, continue current regimen and monitor  -4/26-27/25 BPs variable but fair, monitor.   4-28: Significant  hypertension overnight, likely associated with pain.  Monitor today with increased regimen as above.  Increase Norvasc  to 10 mg for tomorrow a.m.; benefit from rescheduling to nightly  4/29: Blood pressure improved today. Monitor with increase 2-3 days  Vitals:   10/26/23 1954 10/27/23 0311 10/27/23 1248 10/27/23 2010  BP: (!) 151/64 (!) 145/68 (!) 103/90 (!) 163/65   10/28/23 0512 10/28/23 1310 10/28/23 1950 10/29/23 0435  BP: (!) 141/57 (!) 129/53 (!) 162/70 (!) 175/70   10/29/23 0524 10/29/23 1328 10/29/23 1936 10/30/23 0535  BP: (!) 160/71 (!) 142/72 (!) 144/70 (!) 142/64     14. Fibromyalgia   -Hx of fibromyalgia noted.   -Will start gabapentin  100mg  BID  -Increase gabapentin  to 100mg  TID--improved  15. Urine incontinence/UTI - Discussed with husband reports that she did not have frequent incontinence until recent hospitalization  -4/24 Check UA and culture -10/27/23 U/A c/w UTI, started on keflex  250mg  QID on 10/25/23 PM, EOT 10/30/23 PM-- UCx pending, will follow -10/28/23 UCx with >100k CFU E.coli, 40k CFU proteus mirabilus, susceptibilities pending; cont keflex  for now, await susceptibilities  4-28: Both species pansensitive, continue Keflex   4-29.  Multiple, large-volume straight caths overnight, 2x >900.  Started on Flomax 0.4 mg daily.  Every 6 hour PVR/bladder scans.   16  Hypercholesterolemia -Husband report he brought it [place] from home, last dose 4/13. Called pharmacy to discuss- will look into  17. Bowel function - 4/24 add Senokot S 2 tabs QHS, continue MiraLAX  twice daily, colace 100mg  BID  -10/29/23 LBM, small  LOS: 8 days A FACE TO FACE EVALUATION WAS PERFORMED  Bea Lime 10/30/2023, 9:48 AM

## 2023-10-30 NOTE — Plan of Care (Signed)
  Problem: Consults Goal: RH GENERAL PATIENT EDUCATION Description: See Patient Education module for education specifics. Outcome: Progressing   Problem: RH BOWEL ELIMINATION Goal: RH STG MANAGE BOWEL WITH ASSISTANCE Description: STG Manage Bowel with supervision Assistance. Outcome: Progressing   Problem: RH BLADDER ELIMINATION  Problem: RH SAFETY Goal: RH STG ADHERE TO SAFETY PRECAUTIONS W/ASSISTANCE/DEVICE Description: STG Adhere to Safety Precautions With  supervision Assistance/Device. Outcome: Progressing  Goal: RH STG MANAGE BLADDER WITH ASSISTANCE Description: STG Manage Bladder With supervision  Assistance Outcome: Progressing   Problem: RH SKIN INTEGRITY Goal: RH STG SKIN FREE OF INFECTION/BREAKDOWN Description: Manage skin free of infection/breakdown with supervision assistance Outcome: Progressing

## 2023-10-30 NOTE — Progress Notes (Signed)
 Patient ID: Rachel Clark, female   DOB: 04/11/1951, 73 y.o.   MRN: [id number]  SW met with pt, pt husband and son to provide updates from team conference,and recommendation for SNF placement. SW will provide SNF list.   *SW left SNF list with pt son for review.   Norval Been, MSW, LCSW Office: [phone] Cell: [phone] Fax: [phone]

## 2023-10-30 NOTE — Progress Notes (Signed)
 Encouraged oral intake, extra fluids given  Randeen Busman, LPN

## 2023-10-31 ENCOUNTER — Inpatient Hospital Stay (HOSPITAL_COMMUNITY)

## 2023-10-31 DIAGNOSIS — S32391D Other fracture of right ilium, subsequent encounter for fracture with routine healing: Secondary | ICD-10-CM | POA: Diagnosis not present

## 2023-10-31 LAB — URINALYSIS, ROUTINE W REFLEX MICROSCOPIC
Bilirubin Urine: NEGATIVE
Glucose, UA: NEGATIVE mg/dL
Hgb urine dipstick: NEGATIVE
Ketones, ur: NEGATIVE mg/dL
Leukocytes,Ua: NEGATIVE
Nitrite: NEGATIVE
Protein, ur: NEGATIVE mg/dL
Specific Gravity, Urine: 1.014 (ref 1.005–1.030)
pH: 6 (ref 5.0–8.0)

## 2023-10-31 MED ORDER — METHOCARBAMOL 500 MG PO TABS
500.0000 mg | ORAL_TABLET | Freq: Three times a day (TID) | ORAL | Status: DC | PRN
  Administered 2023-10-31 – 2023-11-01 (×2): 500 mg via ORAL
  Filled 2023-10-31 (×2): qty 1

## 2023-10-31 MED ORDER — OXYCODONE HCL 5 MG PO TABS
5.0000 mg | ORAL_TABLET | Freq: Three times a day (TID) | ORAL | Status: DC
  Filled 2023-10-31: qty 1

## 2023-10-31 MED ORDER — TAMSULOSIN HCL 0.4 MG PO CAPS
0.8000 mg | ORAL_CAPSULE | Freq: Every day | ORAL | Status: DC
  Administered 2023-10-31 – 2023-11-05 (×6): 0.8 mg via ORAL
  Filled 2023-10-31 (×6): qty 2

## 2023-10-31 MED ORDER — DICLOFENAC SODIUM 1 % EX GEL
4.0000 g | Freq: Four times a day (QID) | CUTANEOUS | Status: DC
  Administered 2023-10-31 – 2023-11-06 (×22): 4 g via TOPICAL
  Filled 2023-10-31 (×2): qty 100

## 2023-10-31 MED ORDER — SENNOSIDES-DOCUSATE SODIUM 8.6-50 MG PO TABS
2.0000 | ORAL_TABLET | Freq: Two times a day (BID) | ORAL | Status: DC
  Administered 2023-10-31 – 2023-11-06 (×13): 2 via ORAL
  Filled 2023-10-31 (×13): qty 2

## 2023-10-31 NOTE — Progress Notes (Signed)
 Occupational Therapy Session Note  Patient Details  Name: Rachel Clark MRN: [id number] Date of Birth: 01-31-1951  Today's Date: 10/31/2023 OT Individual Time: 1404-1501 OT Individual Time Calculation (min): 57 min    Short Term Goals: Week 2:  OT Short Term Goal 1 (Week 2): Pt will perform toilet transfer with consistent Min A + LRAD. OT Short Term Goal 2 (Week 2): Pt will dress LB with Mod A + LRAD. OT Short Term Goal 3 (Week 2): Pt will bathe LB with Min A + LRAD.  Skilled Therapeutic Interventions/Progress Updates:  Pt greeted supine in bed, pt agreeable to OT intervention.      Transfers/bed mobility/functional mobility: pt completed supine>sit with CGA and + time. Pt completed sit>stand from EOB with RW and MIN A. Pt completed stand pivot to w/c with Rw and MIN- MODA. Pt tends to sit prematurely needing cues for safety to wait until pt feels the chair on both legs. Pt needed step by step cues to sequence pivotal steps.   Remainder of session focused on improving functional ambulation to aid in independence with ADLs and decrease caregiver burden.  Pt completed multiple sit>stands with RW with MINA with + time in between each rep d/t pain.   Pt completed ~ 8 ft of functional ambulation with RW before needing to sit with MINA- MODA. MODA is needed when pt decides to sit too early.   Did transport pt to day room to engage in social participation of task retrieving lime sherbet floats with pts son. Pt reports "that was fun."    Cognition: pt following all commands but noted to perseverate on pain. Pt needed constant encouragement from this OTA and her family.                 Ended session with pt supine in bed with all needs within reach and bed alarm activated.                    Therapy Documentation Precautions:  Precautions Precautions: Fall Recall of Precautions/Restrictions: Impaired Precaution/Restrictions Comments: WBAT, Dementia, PPM (watch  BP) Restrictions Weight Bearing Restrictions Per Provider Order: Yes RLE Weight Bearing Per Provider Order: Weight bearing as tolerated LLE Weight Bearing Per Provider Order: Weight bearing as tolerated  Pain: 5/10 pain reported at R pelvis, rest breaks, distraction and ice utilized as pain mgmt strategy.    Therapy/Group: Individual Therapy  Willadean Hark 10/31/2023, 3:20 PM

## 2023-10-31 NOTE — Progress Notes (Signed)
 PROGRESS NOTE   Subjective/Complaints:  Ongoing urinary retention requiring large-volume caths. Small Bms Mild HTN, improving Severe pain overnight  ROS: Limited due to cognition.. Denies CP, SOB, abd pain, N/V/D/C, or any other complaints at this time.   + R hip/pelvis area pain + Incontinence   Objective:   No results found.   Recent Labs    10/29/23 0541  WBC 7.1  HGB 8.6*  HCT 26.8*  PLT 555*    Recent Labs    10/29/23 0541  NA 132*  K 3.8  CL 98  CO2 25  GLUCOSE 104*  BUN 15  CREATININE 0.67  CALCIUM  8.4*      Intake/Output Summary (Last 24 hours) at 10/31/2023 4098 Last data filed at 10/31/2023 0045 Gross per 24 hour  Intake 177 ml  Output 1460 ml  Net -1283 ml        Physical Exam: Vital Signs Blood pressure (!) 144/66, pulse 90, temperature 97.8 F (36.6 C), temperature source Oral, resp. rate 16, height 5\' 8"  (1.727 m), weight 79.4 kg, SpO2 95%.    General: No apparent distress, laying in bed. HEENT: Head is normocephalic, atraumatic, sclera anicteric, oral mucosa pink and moist Neck: Supple without JVD or lymphadenopathy Heart: Reg rate and rhythm. No murmurs rubs or gallops Chest: CTA bilaterally without wheezes, rales, or rhonchi; no distress Abdomen: Soft, non-tender, non-distended, bowel sounds positive.  Psych: Pt's affect is appropriate. Pt is cooperative Skin: Clean and intact without signs of breakdown  Musculoskeletal:      R greater trochanter and lateral thigh TTP, no apparent edema or deformity.  Exquisite tenderness with internal or external rotation of the thigh.  Can get antigravity independently with some discomfort.      Neuro:   Awake, alert, and oriented x 4. Moderate memory and cognitive deficits, poor insight into current condition.  Cranial nerve exam negative. Strength 4 out of 5 throughout-except 3 out of 5 right hip flexor due to pain No obvious  sensory deficits No ataxia No coordination deficits or tremor No tone        Assessment/Plan: 1. Functional deficits which require 3+ hours per day of interdisciplinary therapy in a comprehensive inpatient rehab setting. Physiatrist is providing close team supervision and 24 hour management of active medical problems listed below. Physiatrist and rehab team continue to assess barriers to discharge/monitor patient progress toward functional and medical goals  Care Tool:  Bathing    Body parts bathed by patient: Right arm, Chest, Left arm, Abdomen, Right upper leg, Left upper leg, Right lower leg, Left lower leg, Face   Body parts bathed by helper: Front perineal area, Buttocks     Bathing assist Assist Level: Minimal Assistance - Patient > 75%     Upper Body Dressing/Undressing Upper body dressing   What is the patient wearing?: Bra, Pull over shirt    Upper body assist Assist Level: Set up assist    Lower Body Dressing/Undressing Lower body dressing      What is the patient wearing?: Underwear/pull up, Pants     Lower body assist Assist for lower body dressing: Maximal Assistance - Patient 25 - 49%  Toileting Toileting    Toileting assist Assist for toileting: Maximal Assistance - Patient 25 - 49%     Transfers Chair/bed transfer  Transfers assist     Chair/bed transfer assist level: Moderate Assistance - Patient 50 - 74%     Locomotion Ambulation   Ambulation assist   Ambulation activity did not occur: Safety/medical concerns  Assist level: Minimal Assistance - Patient > 75% Assistive device: Walker-rolling Max distance: 20'   Walk 10 feet activity   Assist  Walk 10 feet activity did not occur: Safety/medical concerns  Assist level: Minimal Assistance - Patient > 75% Assistive device: Walker-rolling   Walk 50 feet activity   Assist Walk 50 feet with 2 turns activity did not occur: Safety/medical concerns         Walk 150 feet  activity   Assist Walk 150 feet activity did not occur: Safety/medical concerns         Walk 10 feet on uneven surface  activity   Assist Walk 10 feet on uneven surfaces activity did not occur: Safety/medical concerns         Wheelchair     Assist Is the patient using a wheelchair?: Yes Type of Wheelchair: Manual    Wheelchair assist level: Dependent - Patient 0%      Wheelchair 50 feet with 2 turns activity    Assist        Assist Level: Dependent - Patient 0%   Wheelchair 150 feet activity     Assist      Assist Level: Dependent - Patient 0%   Blood pressure (!) 144/66, pulse 90, temperature 97.8 F (36.6 C), temperature source Oral, resp. rate 16, height 5\' 8"  (1.727 m), weight 79.4 kg, SpO2 95%.  Medical Problem List and Plan: 1. Functional deficits secondary to left sacral ala and superior pubic rami fracture.  Weightbearing as tolerated per Dr. Guyann Leitz             -patient may shower -ELOS/Goals: 10-14 days, supervision goals with PT, and sup/min assist with OT and SLP-dispo to SNF    4/29: Setup-SPV UB, Md-Max A LBD. Needing a LOT of encouragement for standing tasks and transfers - cog is limiting. Husband not safe to assist - unstable with practicing with PT/OT. Son not participating in self-care activitied but can assist some. Plan for dispo to SNF  2.  Antithrombotics: -DVT/anticoagulation:  Pharmaceutical: Lovenox  30mg  BID.  Check vascular study-negative              -antiplatelet therapy: Aspirin  81 mg daily  3. Pain Management: Lidoderm  patch, oxycodone  as needed  -4/24 Check xray pelvis/Hip right-- stable  -Muscle rub -10/27/23 xray stable; PT noting that she doesn't ask for PRNs, and mostly has pain during sessions; will schedule 500mg  tylenol  TID and see if that helps for sessions.  4-28: Ongoing 8 out of 10 pain overnight, with significant hypertension associated.  Increase Tylenol  to 1000 mg 3 times daily, schedule oxycodone  5 mg  3 times daily, reduce as needed oxycodone  to 2.5 to 5 mg every 6 hours as needed. 4-29: Does feel pain is improved, patient with poor insight about quality of pain and changes.  Continue current regimen, hesitant to further increase due to cognitive deficits. 4/30: DC standing oxycodone  due to concerns for contribution to urinary retention.  Add Voltaren  gel 4 times daily to hips and Robaxin  as needed.   4. Mood/Behavior/Sleep: Namenda  10 mg twice daily, Cymbalta  60 mg twice daily             -  antipsychotic agents: N/A 5. Neuropsych/cognition: This patient is not quite capable of making decisions on her own behalf. 6. Skin/Wound Care: Routine skin checks 7. Fluids/Electrolytes/Nutrition: Routine in and outs with follow-up chemistries, cont vitamins/supplements 8.  Acute on chronic anemia/history of GI bleed/colon cancer.  Patient receives outpatient iron  infusions.  Continue iron  supplement.  Follow-up per hematology services Dr.Gorsuch  -4/22 HGB stable 8.3  - Stable at 8-9  9.  Epilepsy.  Tegretol  600 mg twice daily as well as zarontin  500 mg twice daily. 10.  CAD/CABG/history of stents/PPM.  Continue low-dose aspirin .  Follow-up cardiology service Dr. [place].  Continue Imdur  15 mg daily. Denies CP 11.  Hypothyroidism.  Synthroid  50mcg daily 12.  Hyponatremia.  Sodium chloride  tablets 1 g 3 times daily.  Follow-up chemistries tomorrow  -4/24 Up to 135  4-28: 132 today, seems about baseline.  Monitor in 2 to 3 days.  13.Hypertension. Norvasc  5 mg daily. Monitor with increased mobility             -bp borderline at present.  4/22 increase norvasc  to 7.5  4/24-25 fair control, continue current regimen and monitor  -4/26-27/25 BPs variable but fair, monitor.   4-28: Significant hypertension overnight, likely associated with pain.  Monitor today with increased regimen as above.  Increase Norvasc  to 10 mg for tomorrow a.m.; benefit from rescheduling to nightly  4/29: Blood pressure improved  today. Monitor with increase 2-3 days--improving  4/30: Staying in 140s to 150s over 60s, monitor 1 more day and may add ARB if no improvement (has had cough with ACE in the past).  Per pharmacy, Norvasc  may also contribute to urinary retention, so may have to adjust this  Vitals:   10/28/23 1310 10/28/23 1950 10/29/23 0435 10/29/23 0524  BP: (!) 129/53 (!) 162/70 (!) 175/70 (!) 160/71   10/29/23 1328 10/29/23 1936 10/30/23 0535 10/30/23 1604  BP: (!) 142/72 (!) 144/70 (!) 142/64 (!) 153/64   10/30/23 1952 10/31/23 0526 10/31/23 0802 10/31/23 1410  BP: (!) 144/65 (!) 144/66 (!) 142/69 (!) 150/65     14. Fibromyalgia   -Hx of fibromyalgia noted.   -Will start gabapentin  100mg  BID  -Increase gabapentin  to 100mg  TID--improved  15. Urine incontinence/UTI - Discussed with husband reports that she did not have frequent incontinence until recent hospitalization  -4/24 Check UA and culture -10/27/23 U/A c/w UTI, started on keflex  250mg  QID on 10/25/23 PM, EOT 10/30/23 PM-- UCx pending, will follow -10/28/23 UCx with >100k CFU E.coli, 40k CFU proteus mirabilus, susceptibilities pending; cont keflex  for now, await susceptibilities  4-28: Both species pansensitive, continue Keflex   4-29.  Multiple, large-volume straight caths overnight, 2x >900.  Started on Flomax 0.4 mg daily.  Every 6 hour PVR/bladder scans.  4/30: Keflex  finished. Retention ongoing. Repeat UA negative, increase flomax to 0.8 mg at bedtime.  See #17 below--constipation and oxycodone  likely contributor to   16  Hypercholesterolemia -Husband report he brought it Rapatha from home, last dose 4/13. Called pharmacy to discuss- will look into  17. Bowel function - 4/24 add Senokot S 2 tabs QHS, continue MiraLAX  twice daily, colace 100mg  BID   4/30: Increase sennakot s tro 2 tabs BID fpr small Bms; KUB today for constipation contributing to urinary retention --large stool burden seen in proximal and transverse colon, ordered nursing to  give sorbitol and follow-up with soapsuds enema if no result.  LOS: 9 days A FACE TO FACE EVALUATION WAS PERFORMED  Bea Lime 10/31/2023, 7:12 AM

## 2023-10-31 NOTE — NC FL2 (Signed)
 [place]  MEDICAID FL2 LEVEL OF CARE FORM     IDENTIFICATION  Patient Name: Rachel Clark Birthdate: 03-22-1951 Sex: female Admission Date (Current Location): 10/22/2023  [facility] and [place] Number:  Producer, television/film/video and Address:  [facility], [address] [place], [place] [address]      Provider Number: [id number]  Attending Physician Name and Address:  Bea Lime, DO  Relative Name and Phone Number:  Makennah Shambaugh (husband) #[phone]    Current Level of Care: Hospital Recommended Level of Care: Skilled Nursing Facility Prior Approval Number:    Date Approved/Denied:   PASRR Number: [id number]  Discharge Plan: SNF    Current Diagnoses: Patient Active Problem List   Diagnosis Date Noted   Pelvic fracture (HCC) 10/17/2023   Other fatigue 09/28/2023   Hematochezia 07/26/2023   Dementia (HCC) 04/26/2023   Right leg pain 01/13/2022   Hypocalcemia 01/13/2022   NSTEMI (non-ST elevated myocardial infarction) (HCC) 11/11/2021   Presence of stent in left circumflex coronary artery    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    Non-ST elevation (NSTEMI) myocardial infarction (HCC) 11/10/2021   Acute upper GI bleed 03/13/2021   GI bleed 03/13/2021   Preventive measure 01/14/2021   Orthostatic hypotension 12/21/2020   Memory change 11/13/2016   Acute bronchitis 03/23/2016   Chest pain with high risk for cardiac etiology 11/05/2015   Essential hypertension 08/25/2015   Sick sinus syndrome (HCC) 08/16/2015   Syncope and collapse 08/10/2015   CAD, multiple vessel    Chest pain    Cardiac pacemaker in situ 07/25/2015   Angina pectoris (HCC) 03/24/2015   Unstable angina (HCC) 10/26/2014   Seizures (HCC) 02/19/2014   Lung nodule 02/06/2013   Thyroid  nodule 02/06/2013   History of colon cancer 12/04/2011   Iron  deficiency anemia due to chronic blood loss 11/17/2011   GERD (gastroesophageal reflux disease) 08/17/2011    Monoclonal gammopathy of unknown significance    Hypothyroid    Coronary artery disease, post CABG 2007     Dyslipidemia     Orientation RESPIRATION BLADDER Height & Weight     Self, Time, Situation  Normal Continent (timed toileting; q6 PBRs; with I/O at times) Weight: 175 lb (79.4 kg) Height:  5\' 8"  (172.7 cm)  BEHAVIORAL SYMPTOMS/MOOD NEUROLOGICAL BOWEL NUTRITION STATUS      Continent Diet  AMBULATORY STATUS COMMUNICATION OF NEEDS Skin   Limited Assist Verbally Normal                       Personal Care Assistance Level of Assistance  Bathing, Feeding, Dressing Bathing Assistance: Limited assistance Feeding assistance: Limited assistance Dressing Assistance: Limited assistance     Functional Limitations Info  Sight, Hearing, Speech Sight Info: Adequate Hearing Info: Adequate Speech Info: Adequate    SPECIAL CARE FACTORS FREQUENCY  PT (By licensed PT), OT (By licensed OT), Speech therapy     PT Frequency: 5xs per week OT Frequency: 5xs per week     Speech Therapy Frequency: 5xs pre week      Contractures Contractures Info: Not present    Additional Factors Info  Allergies, Code Status Code Status Info: Full Allergies Info: See discharge instructions           Current Medications (10/31/2023):  This is the current hospital active medication list Current Facility-Administered Medications  Medication Dose Route Frequency Provider Last Rate Last Admin   acetaminophen  (TYLENOL ) tablet 1,000 mg  1,000 mg Oral TID PC Engler, Morgan C, DO   1,000 mg at 10/31/23 1354   amLODipine  (NORVASC ) tablet 10 mg  10 mg Oral Daily Engler, Morgan C, DO   10 mg at 10/31/23 1610   aspirin  EC tablet 81 mg  81 mg Oral Daily Angiulli, Daniel J, PA-C   81 mg at 10/31/23 9604   calcium  carbonate (TUMS - dosed in mg elemental calcium ) chewable tablet 200 mg of elemental calcium   1 tablet Oral Daily Angiulli, Daniel J, PA-C   200 mg of elemental calcium  at 10/31/23 5409    carbamazepine  (TEGRETOL  XR) 12 hr tablet 600 mg  600 mg Oral BID Angiulli, Daniel J, PA-C   600 mg at 10/31/23 8119   cholecalciferol  (VITAMIN D3) 25 MCG (1000 UNIT) tablet 4,000 Units  4,000 Units Oral Daily Angiulli, Daniel J, PA-C   4,000 Units at 10/31/23 1478   cyanocobalamin  (VITAMIN B12) tablet 1,000 mcg  1,000 mcg Oral Daily Angiulli, Daniel J, PA-C   1,000 mcg at 10/31/23 0756   diclofenac  Sodium (VOLTAREN ) 1 % topical gel 4 g  4 g Topical QID Cherri Corns C, DO       docusate sodium  (COLACE) capsule 100 mg  100 mg Oral BID Angiulli, Daniel J, PA-C   100 mg at 10/30/23 2123   DULoxetine  (CYMBALTA ) DR capsule 60 mg  60 mg Oral BID Angiulli, Daniel J, PA-C   60 mg at 10/31/23 2956   enoxaparin  (LOVENOX ) injection 30 mg  30 mg Subcutaneous Q12H AngiulliEverlyn Hockey, PA-C   30 mg at 10/31/23 2130   ethosuximide  (ZARONTIN ) capsule 500 mg  500 mg Oral BID Angiulli, Daniel J, PA-C   500 mg at 10/31/23 0756   Evolocumab  (Repatha ) 140 mg pen - patient's own supply  140 mg Subcutaneous Q14 Days Lylia Sand, MD   140 mg at 10/28/23 1026   ferrous sulfate  tablet 325 mg  325 mg Oral Q breakfast Sterling Eisenmenger, PA-C   325 mg at 10/31/23 8657   fluticasone  (FLONASE ) 50 MCG/ACT nasal spray 1 spray  1 spray Each Nare Daily Angiulli, Daniel J, PA-C   1 spray at 10/31/23 8469   gabapentin  (NEURONTIN ) capsule 100 mg  100 mg Oral TID Lylia Sand, MD   100 mg at 10/31/23 1354   isosorbide  mononitrate (IMDUR ) 24 hr tablet 15 mg  15 mg Oral Daily Angiulli, Daniel J, PA-C   15 mg at 10/31/23 0757   levothyroxine  (SYNTHROID ) tablet 50 mcg  50 mcg Oral Daily Angiulli, Daniel J, PA-C   50 mcg at 10/31/23 0534   lidocaine  (LIDODERM ) 5 % 2 patch  2 patch Transdermal Q24H Angiulli, Daniel J, PA-C   2 patch at 10/31/23 1238   lidocaine  (XYLOCAINE ) 2 % jelly 1 Application  1 Application Urethral PRN Young Hensen, [facility]   1 Application at 10/29/23 2319   memantine  (NAMENDA ) tablet 10 mg  10 mg Oral BID  Angiulli, Daniel J, PA-C   10 mg at 10/31/23 0756   methocarbamol  (ROBAXIN ) tablet 500 mg  500 mg Oral Q8H PRN Engler, Morgan C, DO   500 mg at 10/31/23 1238   Muscle Rub CREA   Topical PRN Lylia Sand, MD   Given at 10/31/23 1357   ondansetron  (ZOFRAN -ODT) disintegrating tablet 4 mg  4 mg Oral Q6H PRN Sterling Eisenmenger, PA-C   4 mg at 10/30/23 1454   Or   ondansetron  (ZOFRAN ) injection 4 mg  4 mg Intravenous Q6H PRN  Angiulli, Daniel J, PA-C       oxyCODONE  (Oxy IR/ROXICODONE ) immediate release tablet 2.5-5 mg  2.5-5 mg Oral Q6H PRN Engler, Morgan C, DO   5 mg at 10/31/23 1101   pantoprazole  (PROTONIX ) EC tablet 40 mg  40 mg Oral BID Angiulli, Daniel J, PA-C   40 mg at 10/31/23 1610   polyethylene glycol (MIRALAX  / GLYCOLAX ) packet 17 g  17 g Oral BID Sterling Eisenmenger, PA-C   17 g at 10/30/23 2123   senna-docusate (Senokot-S) tablet 2 tablet  2 tablet Oral BID Cherri Corns C, DO   2 tablet at 10/31/23 9604   sodium chloride  tablet 1 g  1 g Oral TID WC Angiulli, Daniel J, PA-C   1 g at 10/31/23 1238   sorbitol 70 % solution 30 mL  30 mL Oral Daily PRN Angiulli, Daniel J, PA-C       tamsulosin (FLOMAX) capsule 0.8 mg  0.8 mg Oral QHS Engler, Morgan C, DO         Discharge Medications: Please see discharge summary for a list of discharge medications.  Relevant Imaging Results:  Relevant Lab Results:   Additional Information VW#[id number]  Rennis Case, LCSW

## 2023-10-31 NOTE — Progress Notes (Signed)
 Physical Therapy Session Note  Patient Details  Name: Rachel Clark MRN: [id number] Date of Birth: September 17, 1950  Today's Date: 10/31/2023 PT Individual Time: 0805-0900 PT Individual Time Calculation (min): 55 min   Short Term Goals: Week 1:  PT Short Term Goal 1 (Week 1): Pt will complete bed mobility with min assist PT Short Term Goal 2 (Week 1): Pt will complete bed to [facility] transfers with min assist and LRAD PT Short Term Goal 3 (Week 1): Pt will ambulates with RW with mod assist 50 with +2 WC follow PT Short Term Goal 4 (Week 1): Pt will complete up/down 1 6" step with BHRs  Skilled Therapeutic Interventions/Progress Updates:     Pt received semi reclined in bed and agrees to therapy. Pt initially reports no pain but upon mobilizing, pt verbalizes pain in pelvis and thighs. PT provides education regarding pain, as well as gentle mobility and rest breaks to manage pain. Pt performs bridging in supine to assist with pulling pants over hips. Pt then performs supine to sit slowly with cues for logrolling and positioning at EOB. Pt performs stand step transfer to [facility] with minA and cues for safe sequencing. WC transport to gym for time management. Pt performs transfer to Nustep with same assistance and cues. Pt completes Nustep for endurance training as well as providing gentle ROM for pain relief in pelvis. Pt completes x12:00 at workload of 5 with average steps per minute ~30. PT provides cues for hand add foot placement and completing full available ROM. Following, pt ambulates x15' to [facility] with RW and CGA, with cues for upright posture to improve body mechanics and balance. During rest break, pt begins to perseverate on pain and insisting on returning to room to "lie down." PT is able to redirect pt and encourages her to ambulate back to room. Pt is reluctantly agreeable and completes sit to stand with minA, then ambulates x110' with RW and minA primarily to promote forward progression. Pt attempts to  stop several times but is able to continue with encouragement. Pt left seated in recliner with alarm intact and all needs within reach.   Therapy Documentation Precautions:  Precautions Precautions: Fall Recall of Precautions/Restrictions: Impaired Precaution/Restrictions Comments: WBAT, Dementia, PPM (watch BP) Restrictions Weight Bearing Restrictions Per Provider Order: Yes RLE Weight Bearing Per Provider Order: Weight bearing as tolerated LLE Weight Bearing Per Provider Order: Weight bearing as tolerated   Therapy/Group: Individual Therapy  Neva Barban, PT, DPT 10/31/2023, 3:58 PM

## 2023-10-31 NOTE — Plan of Care (Signed)
  Problem: RH BOWEL ELIMINATION Goal: RH STG MANAGE BOWEL WITH ASSISTANCE Description: STG Manage Bowel with supervision Assistance. Outcome: Progressing   Problem: RH BLADDER ELIMINATION Goal: RH STG MANAGE BLADDER WITH ASSISTANCE Description: STG Manage Bladder With supervision  Assistance Outcome: Progressing   Problem: RH SKIN INTEGRITY Goal: RH STG SKIN FREE OF INFECTION/BREAKDOWN Description: Manage skin free of infection/breakdown with supervision assistance Outcome: Progressing   Problem: RH SAFETY Goal: RH STG ADHERE TO SAFETY PRECAUTIONS W/ASSISTANCE/DEVICE Description: STG Adhere to Safety Precautions With  supervision Assistance/Device. Outcome: Progressing   Problem: RH PAIN MANAGEMENT Goal: RH STG PAIN MANAGED AT OR BELOW PT'S PAIN GOAL Description: <4 w/ prns Outcome: Progressing   Problem: RH KNOWLEDGE DEFICIT GENERAL Goal: RH STG INCREASE KNOWLEDGE OF SELF CARE AFTER HOSPITALIZATION Description: Manage increase knowledge of self care after hospitalization with supervision from family using educational materials provided Outcome: Progressing

## 2023-10-31 NOTE — Plan of Care (Signed)
  Problem: Consults Goal: RH GENERAL PATIENT EDUCATION Description: See Patient Education module for education specifics. Outcome: Progressing   Problem: RH BOWEL ELIMINATION Goal: RH STG MANAGE BOWEL WITH ASSISTANCE Description: STG Manage Bowel with supervision Assistance. Outcome: Progressing   Problem: RH BLADDER ELIMINATION Goal: RH STG MANAGE BLADDER WITH ASSISTANCE Description: STG Manage Bladder With supervision  Assistance Outcome: Progressing   Problem: RH SKIN INTEGRITY Goal: RH STG SKIN FREE OF INFECTION/BREAKDOWN Description: Manage skin free of infection/breakdown with supervision assistance Outcome: Progressing   Problem: RH SAFETY Goal: RH STG ADHERE TO SAFETY PRECAUTIONS W/ASSISTANCE/DEVICE Description: STG Adhere to Safety Precautions With  supervision Assistance/Device. Outcome: Progressing   Problem: RH PAIN MANAGEMENT Goal: RH STG PAIN MANAGED AT OR BELOW PT'S PAIN GOAL Description: <4 w/ prns Outcome: Progressing   Problem: RH KNOWLEDGE DEFICIT GENERAL Goal: RH STG INCREASE KNOWLEDGE OF SELF CARE AFTER HOSPITALIZATION Description: Manage increase knowledge of self care after hospitalization with supervision from family using educational materials provided Outcome: Progressing

## 2023-10-31 NOTE — Progress Notes (Signed)
 Physical Therapy Session Note  Patient Details  Name: Rachel Clark MRN: [id number] Date of Birth: 1951-07-01  Today's Date: 10/31/2023 PT Individual Time: 1116-1157 PT Individual Time Calculation (min): 41 min   Short Term Goals: Week 1:  PT Short Term Goal 1 (Week 1): Pt will complete bed mobility with min assist PT Short Term Goal 2 (Week 1): Pt will complete bed to [facility] transfers with min assist and LRAD PT Short Term Goal 3 (Week 1): Pt will ambulates with RW with mod assist 50 with +2 WC follow PT Short Term Goal 4 (Week 1): Pt will complete up/down 1 6" step with BHRs  Skilled Therapeutic Interventions/Progress Updates:      Pt supine in bed upon arrival with nurse completing intermittent cath upon arrival. Pt agreeable to therapy. Pt reports R LE pain 10/10, premedicated, therapist provided rest and repositioning and donned ice to R LE.   Pt in very good mood as she is celebrating her wedding anniversary today.   Pt performed rolling B with min A to perform pericare and donn brief. Pt performed supine to sit with CGA, with max encouragement provided for initiation. Pt performed sit to stand from elevated bed with RW and light min A. Pt ambulated 25 feet x2 with RW and CGA, pt required min A for ambulatory transfer to [facility] 2/2 pt attempting to sit prematurely and CGA for ambulatory transfer to bed with max verbal cues provided for safety with emphasis on not sitting until both LE touching surface of bed. Verbal cues provided for increased step length L LE with ambulation. Pt performed sit to supine with supervision.   Therapist providing lots of support and encouragement for participation as pt limited by pain. Education provided on benefits of mobility for healing/pain management.   Pt performed the following exercises for B LE strength/ROM/contracutre prevention  1x10 SLR active assisted B  1x20 ankle pumps B with therpaist assisting for neutral rotation of L LE  1x10 glute sets  holding for 5"  Pt supine in bed at end of session with all needs wtihin reach and bed alarm on.      Therapy Documentation Precautions:  Precautions Precautions: Fall Recall of Precautions/Restrictions: Impaired Precaution/Restrictions Comments: WBAT, Dementia, PPM (watch BP) Restrictions Weight Bearing Restrictions Per Provider Order: Yes RLE Weight Bearing Per Provider Order: Weight bearing as tolerated LLE Weight Bearing Per Provider Order: Weight bearing as tolerated  Therapy/Group: Individual Therapy  [facility] [place], [place], DPT  10/31/2023, 11:15 AM

## 2023-10-31 NOTE — [place] FL2 (Signed)
 [place]  MEDICAID FL2 LEVEL OF CARE FORM     IDENTIFICATION  Patient Name: Rachel Clark Birthdate: 06-04-1951 Sex: female Admission Date (Current Location): 10/22/2023  [facility] and [place] Number:  Producer, television/film/video and Address:  [facility] [place], [address] [place], [place] [address]      Provider Number: [id number]  Attending Physician Name and Address:  Bea Lime, DO  Relative Name and Phone Number:  Felicie Boudwin (husband) #[phone]    Current Level of Care: Hospital Recommended Level of Care: Skilled Nursing Facility Prior Approval Number:    Date Approved/Denied:   PASRR Number:    Discharge Plan: SNF    Current Diagnoses: Patient Active Problem List   Diagnosis Date Noted   Pelvic fracture (HCC) 10/17/2023   Other fatigue 09/28/2023   Hematochezia 07/26/2023   Dementia (HCC) 04/26/2023   Right leg pain 01/13/2022   Hypocalcemia 01/13/2022   NSTEMI (non-ST elevated myocardial infarction) (HCC) 11/11/2021   Presence of stent in left circumflex coronary artery    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    Non-ST elevation (NSTEMI) myocardial infarction (HCC) 11/10/2021   Acute upper GI bleed 03/13/2021   GI bleed 03/13/2021   Preventive measure 01/14/2021   Orthostatic hypotension 12/21/2020   Memory change 11/13/2016   Acute bronchitis 03/23/2016   Chest pain with high risk for cardiac etiology 11/05/2015   Essential hypertension 08/25/2015   Sick sinus syndrome (HCC) 08/16/2015   Syncope and collapse 08/10/2015   CAD, multiple vessel    Chest pain    Cardiac pacemaker in situ 07/25/2015   Angina pectoris (HCC) 03/24/2015   Unstable angina (HCC) 10/26/2014   Seizures (HCC) 02/19/2014   Lung nodule 02/06/2013   Thyroid  nodule 02/06/2013   History of colon cancer 12/04/2011   Iron  deficiency anemia due to chronic blood loss 11/17/2011   GERD (gastroesophageal reflux disease) 08/17/2011   Monoclonal  gammopathy of unknown significance    Hypothyroid    Coronary artery disease, post CABG 2007     Dyslipidemia     Orientation RESPIRATION BLADDER Height & Weight     Self, Time, Situation  Normal Continent (timed toileting; q6 PBRs; with I/O at times) Weight: 175 lb (79.4 kg) Height:  5\' 8"  (172.7 cm)  BEHAVIORAL SYMPTOMS/MOOD NEUROLOGICAL BOWEL NUTRITION STATUS      Continent Diet  AMBULATORY STATUS COMMUNICATION OF NEEDS Skin   Limited Assist Verbally Normal                       Personal Care Assistance Level of Assistance  Bathing, Feeding, Dressing Bathing Assistance: Limited assistance Feeding assistance: Limited assistance Dressing Assistance: Limited assistance     Functional Limitations Info  Sight, Hearing, Speech Sight Info: Adequate Hearing Info: Adequate Speech Info: Adequate    SPECIAL CARE FACTORS FREQUENCY  PT (By licensed PT), OT (By licensed OT), Speech therapy     PT Frequency: 5xs per week OT Frequency: 5xs per week     Speech Therapy Frequency: 5xs pre week      Contractures Contractures Info: Not present    Additional Factors Info  Allergies, Code Status Code Status Info: Full Allergies Info: See discharge instructions           Current Medications (10/31/2023):  This is the current hospital active medication list Current Facility-Administered Medications  Medication Dose Route Frequency Provider Last Rate Last Admin   acetaminophen  (TYLENOL ) tablet 1,000 mg  1,000 mg Oral TID PC Engler, Morgan C, DO   1,000 mg at 10/31/23 0757   amLODipine  (NORVASC ) tablet 10 mg  10 mg Oral Daily Engler, Morgan C, DO   10 mg at 10/31/23 2130   aspirin  EC tablet 81 mg  81 mg Oral Daily Sterling Eisenmenger, PA-C   81 mg at 10/31/23 8657   calcium  carbonate (TUMS - dosed in mg elemental calcium ) chewable tablet 200 mg of elemental calcium   1 tablet Oral Daily Angiulli, Daniel J, PA-C   200 mg of elemental calcium  at 10/31/23 8469   carbamazepine   (TEGRETOL  XR) 12 hr tablet 600 mg  600 mg Oral BID Angiulli, Daniel J, PA-C   600 mg at 10/31/23 0758   cholecalciferol  (VITAMIN D3) 25 MCG (1000 UNIT) tablet 4,000 Units  4,000 Units Oral Daily Angiulli, Daniel J, PA-C   4,000 Units at 10/31/23 6295   cyanocobalamin  (VITAMIN B12) tablet 1,000 mcg  1,000 mcg Oral Daily Angiulli, Daniel J, PA-C   1,000 mcg at 10/31/23 2841   docusate sodium  (COLACE) capsule 100 mg  100 mg Oral BID Angiulli, Daniel J, PA-C   100 mg at 10/30/23 2123   DULoxetine  (CYMBALTA ) DR capsule 60 mg  60 mg Oral BID Angiulli, Daniel J, PA-C   60 mg at 10/31/23 0756   enoxaparin  (LOVENOX ) injection 30 mg  30 mg Subcutaneous Q12H Sterling Eisenmenger, PA-C   30 mg at 10/31/23 3244   ethosuximide  (ZARONTIN ) capsule 500 mg  500 mg Oral BID Angiulli, Daniel J, PA-C   500 mg at 10/31/23 0756   Evolocumab  (Repatha ) 140 mg pen - patient's own supply  140 mg Subcutaneous Q14 Days Lylia Sand, MD   140 mg at 10/28/23 1026   ferrous sulfate  tablet 325 mg  325 mg Oral Q breakfast Sterling Eisenmenger, PA-C   325 mg at 10/31/23 0756   fluticasone  (FLONASE ) 50 MCG/ACT nasal spray 1 spray  1 spray Each Nare Daily Angiulli, Daniel J, PA-C   1 spray at 10/31/23 0102   gabapentin  (NEURONTIN ) capsule 100 mg  100 mg Oral TID Lylia Sand, MD   100 mg at 10/31/23 0756   isosorbide  mononitrate (IMDUR ) 24 hr tablet 15 mg  15 mg Oral Daily Angiulli, Daniel J, PA-C   15 mg at 10/31/23 0757   levothyroxine  (SYNTHROID ) tablet 50 mcg  50 mcg Oral Daily Angiulli, Daniel J, PA-C   50 mcg at 10/31/23 0534   lidocaine  (LIDODERM ) 5 % 2 patch  2 patch Transdermal Q24H Angiulli, Daniel J, PA-C   2 patch at 10/30/23 1141   lidocaine  (XYLOCAINE ) 2 % jelly 1 Application  1 Application Urethral PRN Young Hensen, RPH   1 Application at 10/29/23 2319   memantine  (NAMENDA ) tablet 10 mg  10 mg Oral BID Angiulli, Daniel J, PA-C   10 mg at 10/31/23 0756   Muscle Rub CREA   Topical PRN Lylia Sand, MD   Given at  10/28/23 1940   ondansetron  (ZOFRAN -ODT) disintegrating tablet 4 mg  4 mg Oral Q6H PRN Sterling Eisenmenger, PA-C   4 mg at 10/30/23 1454   Or   ondansetron  (ZOFRAN ) injection 4 mg  4 mg Intravenous Q6H PRN Angiulli, Daniel J, PA-C       oxyCODONE  (Oxy IR/ROXICODONE ) immediate release tablet 2.5-5 mg  2.5-5 mg Oral Q6H PRN Engler, Morgan C, DO   5 mg at 10/31/23 1101   pantoprazole  (PROTONIX ) EC tablet 40 mg  40 mg Oral BID  Angiulli, Daniel J, PA-C   40 mg at 10/31/23 0755   polyethylene glycol (MIRALAX  / GLYCOLAX ) packet 17 g  17 g Oral BID Sterling Eisenmenger, PA-C   17 g at 10/30/23 2123   senna-docusate (Senokot-S) tablet 2 tablet  2 tablet Oral BID Bea Lime, DO   2 tablet at 10/31/23 8119   sodium chloride  tablet 1 g  1 g Oral TID WC Angiulli, Daniel J, PA-C   1 g at 10/31/23 0757   sorbitol 70 % solution 30 mL  30 mL Oral Daily PRN Angiulli, Daniel J, PA-C       tamsulosin (FLOMAX) capsule 0.8 mg  0.8 mg Oral QHS Engler, Morgan C, DO         Discharge Medications: Please see discharge summary for a list of discharge medications.  Relevant Imaging Results:  Relevant Lab Results:   Additional Information [id number]  Rennis Case, LCSW

## 2023-10-31 NOTE — Progress Notes (Signed)
 Occupational Therapy Session Note  Patient Details  Name: Rachel Clark MRN: [id number] Date of Birth: 06/28/1951  Today's Date: 10/31/2023 OT Individual Time: 1001-1044 OT Individual Time Calculation (min): 43 min     Skilled Therapeutic Interventions/Progress Updates: Patient seen for UE therEx and functional activity tolerance to improve endurance for self care skills. Patient tolerated sets of 10 utilizing 1 lb dowel for reach and ROM movements. Patient reporting increasing hip pain. Assisted patient to stand with walker to try and reposition in the recliner. When patient reported continued discomfort assisted patient with ambulating back to bed to continue exercises in supine. Patient with good participation throughout treatment. Nursing notified of patient's pain level. Patient stating that its getting better. Continue with skilled OT treatment plan to improve functional mobility and independence with self care.    Therapy Documentation Precautions:  Precautions Precautions: Fall Recall of Precautions/Restrictions: Impaired Precaution/Restrictions Comments: WBAT, Dementia, PPM (watch BP) Restrictions Weight Bearing Restrictions Per Provider Order: Yes RLE Weight Bearing Per Provider Order: Weight bearing as tolerated LLE Weight Bearing Per Provider Order: Weight bearing as tolerated General:   Vital Signs: Therapy Vitals Pulse Rate: 91 BP: (!) 142/69 Pain: Pain Assessment Pain Scale: 0-10 Pain Score: 1     Therapy/Group: Individual Therapy  Marty Sleet 10/31/2023, 10:45 AM

## 2023-10-31 NOTE — Progress Notes (Signed)
 Explained to patient around 2000 would need to have an enema per the Medical Provider. Explained reason. Given Sorbitol with PRN pain medication. Pt requesting pain medication prior to enema. Intermittent catheterization performed on patient. Unable to void and around 1700 refusing to void expressed not having the urge at that time. Update next nurse of orders by Provider.

## 2023-10-31 NOTE — Progress Notes (Signed)
 Increase in irritability and disorientation this morning. Frustrated about nursing staff encouraging her to use the bathroom this morning. Explained the process of us  having to scan her bladder to see how much fluid is in her bladder. Explained how we want her to void on her own. Explained reason for not wanting her to retain too much urine and could potentially lead to a risk of infection. Was able to void and PVR completed. Explained after 1000 therapy session will attempt to void again and empty bladder. Explained goal is to be below 350 ml of fluid in bladder.

## 2023-10-31 NOTE — Progress Notes (Addendum)
 Patient ID: Rachel Clark, female   DOB: [birth year], 73 y.o.   MRN: [id number]  SW scheduled SNF referral and working on NCPASRR#.  NCPASRR#[phone] A  Norval Been, MSW, LCSW Office: [phone] Cell: [phone] Fax: [phone]

## 2023-11-01 ENCOUNTER — Inpatient Hospital Stay (HOSPITAL_COMMUNITY)

## 2023-11-01 DIAGNOSIS — S32391D Other fracture of right ilium, subsequent encounter for fracture with routine healing: Secondary | ICD-10-CM | POA: Diagnosis not present

## 2023-11-01 LAB — CBC
HCT: 29.4 % — ABNORMAL LOW (ref 36.0–46.0)
Hemoglobin: 9.5 g/dL — ABNORMAL LOW (ref 12.0–15.0)
MCH: 30.3 pg (ref 26.0–34.0)
MCHC: 32.3 g/dL (ref 30.0–36.0)
MCV: 93.6 fL (ref 80.0–100.0)
Platelets: 577 10*3/uL — ABNORMAL HIGH (ref 150–400)
RBC: 3.14 MIL/uL — ABNORMAL LOW (ref 3.87–5.11)
RDW: 21.6 % — ABNORMAL HIGH (ref 11.5–15.5)
WBC: 6.3 10*3/uL (ref 4.0–10.5)
nRBC: 0 % (ref 0.0–0.2)

## 2023-11-01 LAB — BASIC METABOLIC PANEL WITH GFR
Anion gap: 7 (ref 5–15)
BUN: 12 mg/dL (ref 8–23)
CO2: 26 mmol/L (ref 22–32)
Calcium: 8.7 mg/dL — ABNORMAL LOW (ref 8.9–10.3)
Chloride: 102 mmol/L (ref 98–111)
Creatinine, Ser: 0.65 mg/dL (ref 0.44–1.00)
GFR, Estimated: >60 mL/min (ref >60)
Glucose, Bld: 105 mg/dL — ABNORMAL HIGH (ref 70–99)
Potassium: 3.8 mmol/L (ref 3.5–5.1)
Sodium: 135 mmol/L (ref 135–145)

## 2023-11-01 MED ORDER — TRAMADOL HCL 50 MG PO TABS
50.0000 mg | ORAL_TABLET | Freq: Four times a day (QID) | ORAL | Status: DC | PRN
  Administered 2023-11-01 – 2023-11-03 (×4): 100 mg via ORAL
  Administered 2023-11-05: 50 mg via ORAL
  Filled 2023-11-01 (×3): qty 2
  Filled 2023-11-01: qty 1
  Filled 2023-11-01: qty 2

## 2023-11-01 NOTE — Progress Notes (Signed)
 Occupational Therapy Session Note  Patient Details  Name: Rachel Clark MRN: [id number] Date of Birth: January 26, 1951  Today's Date: 11/01/2023 OT Individual Time: 1020-1110 OT Individual Time Calculation (min): 50 min   Today's Date: 11/01/2023 OT Individual Time: 7253-6644 OT Individual Time Calculation (min): 41 min   Short Term Goals: Week 2:  OT Short Term Goal 1 (Week 2): Pt will perform toilet transfer with consistent Min A + LRAD. OT Short Term Goal 2 (Week 2): Pt will dress LB with Mod A + LRAD. OT Short Term Goal 3 (Week 2): Pt will bathe LB with Min A + LRAD.  Skilled Therapeutic Interventions/Progress Updates:   Session 1: Pt received sitting in recliner, complaints of "extreme" pain, medications administered during session. Short ambulating transfer from recliner>WC, ~8 ft, CGA + RW. Dependent transport from room<>main therapy gym for energy conservation. In main therapy gym, patient completes similar short ambulating transfer for BLE strengthening/activity tolerance. Pt then instructed in table-top activity targeting unilateral support on RW for carryover into LB care/toileting. Pt tolerates 1 x 2 mins of activity, CGA + RW. Pt returns to EOB from [facility] in similar format as recliner>WC, BLE elevation back to bed with Min A. Pt remained resting in bed with all immediate needs met.   Session 2: Pt received resting in bed, un-rated RLE pain, pre-mediated, rest provided as needed. Transition to EOB with supervision. Ambulating transfer from EOB>WC placed by door with CGA + RW. Dependent for transport to nurses station, then another bout of ambulation completed for ~50 ft x2. In day room, pt instructed in standing balance/tolerance activity using mirror/squiggz. Pt able to progress from unilateral support to no UE support with CGA-Min A, cuing provided for terminal knee extension. Pt then participates simple matching activity, tolerating static stance with unilateral support for ~2  minutes. Sit<>stands throughout session at [facility]. Pt remained sitting in [facility], family present at bedside.   Therapy Documentation Precautions:  Precautions Precautions: Fall Recall of Precautions/Restrictions: Impaired Precaution/Restrictions Comments: WBAT, Dementia, PPM (watch BP) Restrictions Weight Bearing Restrictions Per Provider Order: Yes RLE Weight Bearing Per Provider Order: Weight bearing as tolerated LLE Weight Bearing Per Provider Order: Weight bearing as tolerated   Therapy/Group: Individual Therapy  Artemus Biles, OTR/L, MSOT  11/01/2023, 6:33 AM

## 2023-11-01 NOTE — Progress Notes (Signed)
 Occupational Therapy Session Note  Patient Details  Name: Rachel Clark MRN: [id number] Date of Birth: 06/01/1951  Today's Date: 11/02/2023 OT Individual Time: 5621-3086 OT Individual Time Calculation (min): 53 min   Today's Date: 11/02/2023 OT Individual Time: 5784-6962 OT Individual Time Calculation (min): 39 min   Short Term Goals: Week 2:  OT Short Term Goal 1 (Week 2): Pt will perform toilet transfer with consistent Min A + LRAD. OT Short Term Goal 2 (Week 2): Pt will dress LB with Mod A + LRAD. OT Short Term Goal 3 (Week 2): Pt will bathe LB with Min A + LRAD.  Skilled Therapeutic Interventions/Progress Updates:   Session 1: Pt received resting in bed, increased cuing required to alert, complaints of pain in RLE, pre-mediated and rest provided. Pt comes to EOB with supervision + bed features, sit<>stands during session at CGA-Min A + RW, ambulatory toilet transfer with CGA + RW. Pt toilets with Min A for thoroughness of posterior pericare, post BM. Pt bathes remainder of LB with setup/supervision (cuing for thoroughness), pt able to thread BLE into LB garments in similar fashion, standing hike with CGA-Min A for balance. Sink-side UB care with setup/supervision. Pt then completes short bout of ambulation, ~51ft, with CGA + RW. Pt remained sitting in recliner, family present, all immediate needs met.   Session 2: Pt received sitting in recliner, unrated RLE pain, rest-provided as needed. Pt performs sit<>stand transfers during session with overall CGA + RW, ambulation from room>day room in similar fashion, cuing provided for safe RW proximity. In day room, standing tolerance/balance activity attempted, patient refusing to participate due to pain, even with extended rest-break provided. Pt receptive to ambulation back to room, requiring seated rest-break at mid-point, increased cuing to complete bout of walking towards room. Pt remained resting in recliner, ice bag applied to R-hip for  pain, posey belt activated.   Therapy Documentation Precautions:  Precautions Precautions: Fall Recall of Precautions/Restrictions: Impaired Precaution/Restrictions Comments: WBAT, Dementia, PPM (watch BP) Restrictions Weight Bearing Restrictions Per Provider Order: Yes RLE Weight Bearing Per Provider Order: Weight bearing as tolerated LLE Weight Bearing Per Provider Order: Weight bearing as tolerated   Therapy/Group: Individual Therapy  Artemus Biles, OTR/L, MSOT  11/02/2023, 12:54 PM

## 2023-11-01 NOTE — Plan of Care (Signed)
  Problem: Consults Goal: RH GENERAL PATIENT EDUCATION Description: See Patient Education module for education specifics. Outcome: Progressing   Problem: RH BOWEL ELIMINATION Goal: RH STG MANAGE BOWEL WITH ASSISTANCE Description: STG Manage Bowel with supervision Assistance. Outcome: Progressing   Problem: RH BLADDER ELIMINATION Goal: RH STG MANAGE BLADDER WITH ASSISTANCE Description: STG Manage Bladder With supervision  Assistance Outcome: Not Progressing   Problem: RH SKIN INTEGRITY Goal: RH STG SKIN FREE OF INFECTION/BREAKDOWN Description: Manage skin free of infection/breakdown with supervision assistance Outcome: Progressing   Problem: RH SAFETY Goal: RH STG ADHERE TO SAFETY PRECAUTIONS W/ASSISTANCE/DEVICE Description: STG Adhere to Safety Precautions With  supervision Assistance/Device. Outcome: Progressing Flowsheets (Taken 11/01/2023 1656) STG:Pt will adhere to safety precautions with assistance/device: 4-Minimal assistance   Problem: RH KNOWLEDGE DEFICIT GENERAL Goal: RH STG INCREASE KNOWLEDGE OF SELF CARE AFTER HOSPITALIZATION Description: Manage increase knowledge of self care after hospitalization with supervision from family using educational materials provided Outcome: Progressing

## 2023-11-01 NOTE — Progress Notes (Signed)
 Physical Therapy Weekly Progress Note  Patient Details  Name: Rachel Clark MRN: [id number] Date of Birth: [birth year]  Beginning of progress report period: October 23, 2023 End of progress report period: Nov 01, 2023  Today's Date: 11/01/2023 PT Individual Time: 1400-1430 PT Individual Time Calculation (min): 30 min   Patient has met 3 of 4 short term goals. Pt is progressing toward mobility goals, improving independence with bed mobility, transfers, and ambulation. Pt's progress has been slowed by limited pain tolerance and baseline dementia, making carryover inconsistent and pt's participation has been variable. Pt has made recent gains, however, and has ambulated up to 110' with RW and minA. Pt currently recommended for SNF at DC, but should disposition change with plans to DC home, pt would benefit from hands on family ed.   Patient continues to demonstrate the following deficits muscle weakness, decreased cardiorespiratoy endurance, and decreased sitting balance, decreased standing balance, decreased postural control, and decreased balance strategies and therefore will continue to benefit from skilled PT intervention to increase functional independence with mobility.  Patient progressing toward long term goals..  Continue plan of care.  PT Short Term Goals Week 1:  PT Short Term Goal 1 (Week 1): Pt will complete bed mobility with min assist PT Short Term Goal 1 - Progress (Week 1): Met PT Short Term Goal 2 (Week 1): Pt will complete bed to [facility] transfers with min assist and LRAD PT Short Term Goal 2 - Progress (Week 1): Met PT Short Term Goal 3 (Week 1): Pt will ambulates with RW with mod assist 50 with +2 WC follow PT Short Term Goal 3 - Progress (Week 1): Met PT Short Term Goal 4 (Week 1): Pt will complete up/down 1 6" step with BHRs PT Short Term Goal 4 - Progress (Week 1): Progressing toward goal Week 2:  PT Short Term Goal 1 (Week 2): STGs = LTGs  Skilled Therapeutic  Interventions/Progress Updates:  Ambulation/gait training;Discharge planning;Functional mobility training;Psychosocial support;Therapeutic Activities;Visual/perceptual remediation/compensation;Balance/vestibular training;Disease management/prevention;Neuromuscular re-education;Therapeutic Exercise;Wheelchair propulsion/positioning;Cognitive remediation/compensation;DME/adaptive equipment instruction;Pain management;UE/LE Strength taining/ROM;Community reintegration;Patient/family education;Stair training;UE/LE Coordination activities   Pt received seated in WC and reluctantly agrees to therapy, attempting to refuse secondary to pain in RLE. PT provides rest breaks as needed, gentle mobility, and ice therapy to help manage pain. PT provides strong encouragement to participate and pt is agreeable. WC transport to gym. Pt transfer from [facility] to [facility] with minA and no AD, with cues for safe positioning and hand placement. Pt attempts to complete [facility] activity for endurance training and gentle mobility, and has difficulty with continuation of activity due to pain  and pt requesting to rest. PT provides pt with several rest breaks as well as providing ice for Rt thigh pain. Pt verbalizes improved pain levels but still has difficulty with activity. PT able to redirect pt to ambulating back to room, and pt able to ambulates x115' with RW and minA, with PT facilitating RW propulsion and cues for for posture and increasing stride length. Pt performs sit to supine with minA for management of RLE. Pt left supine and misses 15 minutes of PT due to pain.   Therapy Documentation Precautions:  Precautions Precautions: Fall Recall of Precautions/Restrictions: Impaired Precaution/Restrictions Comments: WBAT, Dementia, PPM (watch BP) Restrictions Weight Bearing Restrictions Per Provider Order: Yes RLE Weight Bearing Per Provider Order: Weight bearing as tolerated LLE Weight Bearing Per Provider Order: Weight bearing as  tolerated   Therapy/Group: Individual Therapy  Neva Barban, PT, DPT 11/01/2023, 4:00 PM

## 2023-11-01 NOTE — Progress Notes (Signed)
 PROGRESS NOTE   Subjective/Complaints:  Blood pressure intermittently elevated into the 150s, otherwise normotensive and vital stable 0 out of 10 pain overnight--patient denies pain currently on exam. Large bowel movement with sorbitol  last night and small bowel movement this a.m.  Patient refused enema.  Continues with large volume bladder scans and straight caths every 6 hours. Last low PVR/continent voids recorded 4/28.  Patient denies any dysuria or urgency.  Simply does not feel the need to go.  Denies any saddle anesthesia.  Denies any difficulty with bowels.  ROS: Limited due to cognition.. Denies CP, SOB, abd pain, N/V/D/C, or any other complaints at this time.   + R hip/pelvis area pain + Incontinence   Objective:   DG Abd 1 View Result Date: 10/31/2023 CLINICAL DATA:  Constipation. EXAM: ABDOMEN - 1 VIEW COMPARISON:  None Available. FINDINGS: Large amount of colonic stool identified in the upper abdomen, transverse colon. Less distally. There is some air in the rectum. Scattered small bowel gas. No frank obstruction. There are 3 linear metallic foci overlying loops of colon in the right upper quadrant. Please correlate for clips or other process. Scattered vascular calcifications. Sternal wires are seen at the edge of the imaging field. The most inferior sternal wire is fractured. Pacemaker leads as well. There is a known fracture along the medial left pubic bone. Is also a bony excrescence from the iliac crest laterally on the left. Possible osteo chondroma. Unchanged from prior pelvic x-ray of 10/17/2023. degenerative changes of the spine. IMPRESSION: Nonspecific bowel gas pattern. Significant stool along the ascending and transverse colon. Three metallic foci overlie the right upper quadrant, right side of the colon. Please correlate for any known clips or instrumentation. Known left-sided pubic bone fracture. Electronically  Signed   By: Adrianna Horde M.D.   On: 10/31/2023 16:19     Recent Labs    11/01/23 0603  WBC 6.3  HGB 9.5*  HCT 29.4*  PLT 577*    Recent Labs    11/01/23 0603  NA 135  K 3.8  CL 102  CO2 26  GLUCOSE 105*  BUN 12  CREATININE 0.65  CALCIUM  8.7*      Intake/Output Summary (Last 24 hours) at 11/01/2023 0949 Last data filed at 11/01/2023 0750 Gross per 24 hour  Intake 580 ml  Output 3650 ml  Net -3070 ml        Physical Exam: Vital Signs Blood pressure 130/61, pulse 91, temperature 98.2 F (36.8 C), resp. rate 19, height 5\' 8"  (1.727 m), weight 79.4 kg, SpO2 96%.    General: No apparent distress, laying in bed. HEENT: Head is normocephalic, atraumatic, sclera anicteric, oral mucosa pink and moist Neck: Supple without JVD or lymphadenopathy Heart: Reg rate and rhythm. No murmurs rubs or gallops Chest: CTA bilaterally without wheezes, rales, or rhonchi; no distress Abdomen: Soft, non-tender, non-distended, bowel sounds positive.  Psych: Pt's affect is appropriate. Pt is cooperative Skin: Clean and intact without signs of breakdown  Musculoskeletal:      R greater trochanter and lateral thigh TTP, no apparent edema or deformity.  Exquisite tenderness with internal or external rotation of the thigh.  Can get  antigravity independently with some discomfort.--Unchanged from prior exam      Neuro:   Awake, alert, and oriented x 4. Moderate memory and cognitive deficits, poor insight into current condition.  Cranial nerve exam negative. Strength 4 out of 5 throughout-except 3 out of 5 right hip flexor due to pain No obvious sensory deficits No ataxia No coordination deficits or tremor No tone  Unchanged from prior exams 5-1     Assessment/Plan: 1. Functional deficits which require 3+ hours per day of interdisciplinary therapy in a comprehensive inpatient rehab setting. Physiatrist is providing close team supervision and 24 hour management of active medical  problems listed below. Physiatrist and rehab team continue to assess barriers to discharge/monitor patient progress toward functional and medical goals  Care Tool:  Bathing    Body parts bathed by patient: Right arm, Chest, Left arm, Abdomen, Right upper leg, Left upper leg, Right lower leg, Left lower leg, Face   Body parts bathed by helper: Front perineal area, Buttocks     Bathing assist Assist Level: Minimal Assistance - Patient > 75%     Upper Body Dressing/Undressing Upper body dressing   What is the patient wearing?: Bra, Pull over shirt    Upper body assist Assist Level: Set up assist    Lower Body Dressing/Undressing Lower body dressing      What is the patient wearing?: Underwear/pull up, Pants     Lower body assist Assist for lower body dressing: Maximal Assistance - Patient 25 - 49%     Toileting Toileting    Toileting assist Assist for toileting: Maximal Assistance - Patient 25 - 49%     Transfers Chair/bed transfer  Transfers assist     Chair/bed transfer assist level: Minimal Assistance - Patient > 75%     Locomotion Ambulation   Ambulation assist   Ambulation activity did not occur: Safety/medical concerns  Assist level: Minimal Assistance - Patient > 75% Assistive device: Walker-rolling Max distance: 110'   Walk 10 feet activity   Assist  Walk 10 feet activity did not occur: Safety/medical concerns  Assist level: Minimal Assistance - Patient > 75% Assistive device: Walker-rolling   Walk 50 feet activity   Assist Walk 50 feet with 2 turns activity did not occur: Safety/medical concerns (pain)  Assist level: Minimal Assistance - Patient > 75% Assistive device: Walker-rolling    Walk 150 feet activity   Assist Walk 150 feet activity did not occur: Safety/medical concerns  Assist level: Minimal Assistance - Patient > 75% Assistive device: Walker-rolling    Walk 10 feet on uneven surface  activity   Assist Walk 10  feet on uneven surfaces activity did not occur: Safety/medical concerns         Wheelchair     Assist Is the patient using a wheelchair?: Yes Type of Wheelchair: Manual    Wheelchair assist level: Dependent - Patient 0%      Wheelchair 50 feet with 2 turns activity    Assist        Assist Level: Dependent - Patient 0%   Wheelchair 150 feet activity     Assist      Assist Level: Dependent - Patient 0%   Blood pressure 130/61, pulse 91, temperature 98.2 F (36.8 C), resp. rate 19, height 5\' 8"  (1.727 m), weight 79.4 kg, SpO2 96%.  Medical Problem List and Plan: 1. Functional deficits secondary to left sacral ala and superior pubic rami fracture.  Weightbearing as tolerated per Dr. Guyann Leitz             -  patient may shower -ELOS/Goals: 10-14 days, supervision goals with PT, and sup/min assist with OT and SLP-dispo to SNF    4/29: Setup-SPV UB, Md-Max A LBD. Needing a LOT of encouragement for standing tasks and transfers - cog is limiting. Husband not safe to assist - unstable with practicing with PT/OT. Son not participating in self-care activitied but can assist some. Plan for dispo to SNF  2.  Antithrombotics: -DVT/anticoagulation:  Pharmaceutical: Lovenox  30mg  BID.  Check vascular study-negative              -antiplatelet therapy: Aspirin  81 mg daily  3. Pain Management: Lidoderm  patch, oxycodone  as needed  -4/24 Check xray pelvis/Hip right-- stable  -Muscle rub -10/27/23 xray stable; PT noting that she doesn't ask for PRNs, and mostly has pain during sessions; will schedule 500mg  tylenol  TID and see if that helps for sessions.  4-28: Ongoing 8 out of 10 pain overnight, with significant hypertension associated.  Increase Tylenol  to 1000 mg 3 times daily, schedule oxycodone  5 mg 3 times daily, reduce as needed oxycodone  to 2.5 to 5 mg every 6 hours as needed. 4-29: Does feel pain is improved, patient with poor insight about quality of pain and changes.  Continue  current regimen, hesitant to further increase due to cognitive deficits. 4/30: DC standing oxycodone  due to concerns for contribution to urinary retention.  Add Voltaren  gel 4 times daily to hips and Robaxin  as needed. 5-1: DC oxycodone  for ongoing urinary retention and constipation, changed to tramadol  50 to 100 mg every 6 hours as needed for pain.   4. Mood/Behavior/Sleep: Namenda  10 mg twice daily, Cymbalta  60 mg twice daily             -antipsychotic agents: N/A 5. Neuropsych/cognition: This patient is not quite capable of making decisions on her own behalf. 6. Skin/Wound Care: Routine skin checks 7. Fluids/Electrolytes/Nutrition: Routine in and outs with follow-up chemistries, cont vitamins/supplements 8.  Acute on chronic anemia/history of GI bleed/colon cancer.  Patient receives outpatient iron  infusions.  Continue iron  supplement.  Follow-up per hematology services Dr.Gorsuch  -4/22 HGB stable 8.3  - Stable at 8-9  9.  Epilepsy.  Tegretol  600 mg twice daily as well as zarontin  500 mg twice daily. 10.  CAD/CABG/history of stents/PPM.  Continue low-dose aspirin .  Follow-up cardiology service Dr. [place].  Continue Imdur  15 mg daily. Denies CP 11.  Hypothyroidism.  Synthroid  50mcg daily 12.  Hyponatremia.  Sodium chloride  tablets 1 g 3 times daily.  Follow-up chemistries tomorrow  -4/24 Up to 135  4-28: 132 today, seems about baseline.  Monitor in 2 to 3 days.  13.Hypertension. Norvasc  5 mg daily. Monitor with increased mobility             -bp borderline at present.  4/22 increase norvasc  to 7.5  4/24-25 fair control, continue current regimen and monitor  -4/26-27/25 BPs variable but fair, monitor.   4-28: Significant hypertension overnight, likely associated with pain.  Monitor today with increased regimen as above.  Increase Norvasc  to 10 mg for tomorrow a.m.; benefit from rescheduling to nightly  4/29: Blood pressure improved today. Monitor with increase 2-3 days--improving  4/30:  Staying in 140s to 150s over 60s, monitor 1 more day and may add ARB if no improvement (has had cough with ACE in the past).  Per pharmacy, Norvasc  may also contribute to urinary retention, so may have to adjust this  Vitals:   10/29/23 0524 10/29/23 1328 10/29/23 1936 10/30/23 0535  BP: Aaron Aas)  160/71 (!) 142/72 (!) 144/70 (!) 142/64   10/30/23 1604 10/30/23 1952 10/31/23 0526 10/31/23 0802  BP: (!) 153/64 (!) 144/65 (!) 144/66 (!) 142/69   10/31/23 1410 10/31/23 2020 11/01/23 0510 11/01/23 0849  BP: (!) 150/65 137/63 (!) 154/64 130/61     14. Fibromyalgia   -Hx of fibromyalgia noted.   -Will start gabapentin  100mg  BID  -Increase gabapentin  to 100mg  TID--improved  15. Urine incontinence/UTI - Discussed with husband reports that she did not have frequent incontinence until recent hospitalization  -4/24 Check UA and culture -10/27/23 U/A c/w UTI, started on keflex  250mg  QID on 10/25/23 PM, EOT 10/30/23 PM-- UCx pending, will follow -10/28/23 UCx with >100k CFU E.coli, 40k CFU proteus mirabilus, susceptibilities pending; cont keflex  for now, await susceptibilities  4-28: Both species pansensitive, continue Keflex   4-29.  Multiple, large-volume straight caths overnight, 2x >900.  Started on Flomax  0.4 mg daily.  Every 6 hour PVR/bladder scans.  4/30: Keflex  finished. Retention ongoing. Repeat UA negative, increase flomax  to 0.8 mg at bedtime.  See #17 below--constipation and oxycodone  likely contributors  5-1: Ongoing need for intermittent straight caths, pending renal ultrasound today given repeated high-volume caths may need to place Foley catheter.  Family made aware.  DC oxycodone  as above, patient had large bowel movement yesterday.   16  Hypercholesterolemia -Husband report he brought it [place] from home, last dose 4/13. Called pharmacy to discuss  51. Bowel function - 4/24 add Senokot S 2 tabs QHS, continue MiraLAX  twice daily, colace 100mg  BID   4/30: Increase sennakot s tro 2 tabs BID  fpr small Bms; KUB today for constipation contributing to urinary retention --large stool burden seen in proximal and transverse colon, ordered nursing to give sorbitol  and follow-up with soapsuds enema if no result.  5-1: Large bowel movement with sorbitol  yesterday; patient refused enema.  LOS: 10 days A FACE TO FACE EVALUATION WAS PERFORMED  Bea Lime 11/01/2023, 9:49 AM

## 2023-11-01 NOTE — Progress Notes (Signed)
 RN informed pt that it was time for her nightly medications and enema at bedtime. Pt stated, "no one told me about an enema". RN educated pt on the importance behind her receiving the enema, Pt stated, "I know what an enema is and I already had a bowel movement". Pt tolerated all medications well at bedtime with no adverse reactions noted. RN asked pt one more time if she would like to have her enema but pt refused. No acute distress noted at this time. Bed locked and in lowest position with call bell in reach.

## 2023-11-01 NOTE — Progress Notes (Signed)
 Physical Therapy Session Note  Patient Details  Name: Rachel Clark MRN: [id number] Date of Birth: 1951-06-01  Today's Date: 11/01/2023 PT Individual Time: 0900-0957 PT Individual Time Calculation (min): 57 min   Short Term Goals: Week 1:  PT Short Term Goal 1 (Week 1): Pt will complete bed mobility with min assist PT Short Term Goal 2 (Week 1): Pt will complete bed to [facility] transfers with min assist and LRAD PT Short Term Goal 3 (Week 1): Pt will ambulates with RW with mod assist 50 with +2 WC follow PT Short Term Goal 4 (Week 1): Pt will complete up/down 1 6" step with BHRs  Skilled Therapeutic Interventions/Progress Updates:      Pt supine in bed upon arrival. Pt agreeable to therapy. Pt reports B hip pain, nurse present to administer medication at start of session. Therapist provided rest breaks and repositioning throughout session. Pt requesting pain medicine at end of session, notified nursing.   Supine to sit with use of bed features (HOB eelvated to 47 deg and supervision). Pt donned ted hose and pants while seated EOB with total A for feeding through B LE. Pt performed sit to stand with RW and CGA, Pt donned pants over buttocks with min A 2/2 posterior LOB.   Pt reports need to use bathroom. Pt ambualted to bed to [facility] over toielt with RW and CGA, verbal cues provided for reciprocal gait as pt demos step to antalgic gait. Pt continent of bowel, pt demos impulsivity with standing at toilet. Pt stood with supervision/CGA with use of unilateral UE support on grab bar to perform pericare prior to quickly needing to sit again 2/2 pain. Pt attempted to stand multiple times to donn pants however pt quickly sitting again 2/2 pain. Pt donned pants over buttocks with mod A, while standing with unilateral UE support on RW.   Pt performed stand pivot transfer BSC to WC with RW and CGA.   Pt transported dependent in [facility] for time/energy conservation.   Discussed trialing stairs with therapist  providing support and encouragement however pt refusing 2/2 pain.   Pt performed Stand pivot transfer car simulator with RW and CGA/min A 2/2 pt sitting prematurely depsite verbal and tactile cues for not sitting until B LE touching surface of car. Pt required min A to lift L LE into car, pt able to lift B LE out of car with supervision.   Attemtped ambualtion from day room to room however pt ambulated ~5 feet and sitting in nearby chair in gym as soon as she saw it (with min-mod A 2/2 pt attempting to sit prematurely.   Pt performed stand pivot transfer chair to WC, WC to recliner with RW and CGA/min A, max verbal cues provided for technique and UE/LE posiitoning for correction of pt attempts to sit prematurely.   Pt seated in recliner with all needs within reach and bed alram on.     Therapy Documentation Precautions:  Precautions Precautions: Fall Recall of Precautions/Restrictions: Impaired Precaution/Restrictions Comments: WBAT, Dementia, PPM (watch BP) Restrictions Weight Bearing Restrictions Per Provider Order: Yes RLE Weight Bearing Per Provider Order: Weight bearing as tolerated LLE Weight Bearing Per Provider Order: Weight bearing as tolerated   Therapy/Group: Individual Therapy  [facility] [place], [place], DPT  11/01/2023, 9:20 AM

## 2023-11-02 ENCOUNTER — Inpatient Hospital Stay (HOSPITAL_COMMUNITY)

## 2023-11-02 DIAGNOSIS — S32391D Other fracture of right ilium, subsequent encounter for fracture with routine healing: Secondary | ICD-10-CM | POA: Diagnosis not present

## 2023-11-02 LAB — URINALYSIS, W/ REFLEX TO CULTURE (INFECTION SUSPECTED)
Bilirubin Urine: NEGATIVE
Glucose, UA: NEGATIVE mg/dL
Hgb urine dipstick: NEGATIVE
Ketones, ur: NEGATIVE mg/dL
Nitrite: NEGATIVE
Protein, ur: NEGATIVE mg/dL
Specific Gravity, Urine: 1.015 (ref 1.005–1.030)
pH: 6 (ref 5.0–8.0)

## 2023-11-02 MED ORDER — CHLORHEXIDINE GLUCONATE CLOTH 2 % EX PADS
6.0000 | MEDICATED_PAD | Freq: Two times a day (BID) | CUTANEOUS | Status: DC
  Administered 2023-11-02 – 2023-11-06 (×8): 6 via TOPICAL

## 2023-11-02 MED ORDER — SODIUM CHLORIDE 1 G PO TABS
1.0000 g | ORAL_TABLET | Freq: Two times a day (BID) | ORAL | Status: DC
  Administered 2023-11-02 – 2023-11-06 (×8): 1 g via ORAL
  Filled 2023-11-02 (×8): qty 1

## 2023-11-02 MED ORDER — CHLORHEXIDINE GLUCONATE CLOTH 2 % EX PADS: 6.0000 | MEDICATED_PAD | Freq: Every day | CUTANEOUS | Status: DC

## 2023-11-02 NOTE — Progress Notes (Signed)
 Physical Therapy Session Note  Patient Details  Name: Rachel Clark MRN: [id number] Date of Birth: 10/21/1950  Today's Date: 11/02/2023 PT Individual Time: 0803-0845 PT Individual Time Calculation (min): 42 min   Today's Date: 11/02/2023 PT Individual Time: 1450-1530 PT Individual Time Calculation (min): 40 min   Short Term Goals: Week 2:  PT Short Term Goal 1 (Week 2): STGs = LTGs  Skilled Therapeutic Interventions/Progress Updates:     1st Session: Pt received semi reclined in bed and agrees to therapy. Reports pain in RLE. PT provides rest breaks as needed to manage pain. Pt performs supine to sit with bed features, then completes stand step transfer to [facility] without AD, requiring minA for safety due to pt attempting to lunge toward seat prior to being safely in position. Pt ambulates multiple bouts during session to work on endurance training, balance, and mobility training. Pt requires minA for safety due to impulsively attempting to sit down, but otherwise able to complete with CGA and no LOBs. PT provides cues for upright gaze to improve balance, as well as increasing stride length and gait speed to decrease risk for falls. Pt ambulates 2x70' and 1x100' with extended seated rest breaks between each bout. Following extended seated rest break, pt ambulates x120' back to room with CGA and same cues. Left seated in recliner with alarm intact and all needs within reach.  2nd Session: Pt received seated in WC and agrees to hterapy. Reports pain in RLE during session> PT provides rest breaks and encouragement to participate throughout to manage pain. Pt performs sit to stand with CGA and cues for initiation and hand placement. Pt ambulates x100' to dayroom with RW and minA, with cues for upright posture to improve balance, and increasing LLE step height and stride length to decrease risk for falls. Pt participates in cornhole activity to promote dynamic standing balance challenge, shifting weight  from LLE to RLE, and providing pt with distraction from pain, as she tends to perseverate on pain. Pt requires multiple seated rest breaks during activity, typically tossing 3-4 bean bags with each bout of standing. PT provides cues for posture and body mechanics. Pt ambulates back to room with minA to promote trunk stability. Sit to supine with cues for sequencing. Left with alarm itnact and all needs within reach.   Therapy Documentation Precautions:  Precautions Precautions: Fall Recall of Precautions/Restrictions: Impaired Precaution/Restrictions Comments: WBAT, Dementia, PPM (watch BP) Restrictions Weight Bearing Restrictions Per Provider Order: Yes RLE Weight Bearing Per Provider Order: Weight bearing as tolerated LLE Weight Bearing Per Provider Order: Weight bearing as tolerated    Therapy/Group: Individual Therapy  Neva Barban, PT, DPT 11/02/2023, 3:44 PM

## 2023-11-02 NOTE — Progress Notes (Signed)
 Occupational Therapy Session Note  Patient Details  Name: CHAVIVA DEFENBAUGH MRN: [id number] Date of Birth: [birth year]  Today's Date: 11/02/2023 OT Individual Time: 0913-1010 OT Individual Time Calculation (min): 57 min    Short Term Goals: Week 2:  OT Short Term Goal 1 (Week 2): Pt will perform toilet transfer with consistent Min A + LRAD. OT Short Term Goal 2 (Week 2): Pt will dress LB with Mod A + LRAD. OT Short Term Goal 3 (Week 2): Pt will bathe LB with Min A + LRAD.  Skilled Therapeutic Interventions/Progress Updates:  Pt greeted seated in recliner, pt agreeable to OT intervention.      Transfers/bed mobility/functional mobility: pt completed sit>stand from recliner with RW and MINA, stand pivot to w/c with RW and MIN A mostly for impulsivity as pt sits prematurely.   Therapeutic activity:  Pt completed standing tolerance task with pt instructed to stand to match cards to board with a focus on dynamic reaching and standing tolerance for ADLs. Pt completed task with CGA for balance, MOD verbal cues needed to problem solve how to position cards on board. Pt needed MAX cues to allow weight through RLE as pt prefers to stand on LLE.   Pt completed additional standing tolerance task with pt instructed to stand to match rings to squigz with a focus on standing balance and problem solving. Pt completed task with CGA for balance and MIN verbal cues to match correct colors to squigz.   Cognition: pt pleasantly confused during session, stating we are "at an old peoples place." Pt asking this therapist how long I have had this business. Reoriented pt to surroundings.                 Ended session with pt supine in bed with all needs within reach and bed alarm activated.                    Therapy Documentation Precautions:  Precautions Precautions: Fall Recall of Precautions/Restrictions: Impaired Precaution/Restrictions Comments: WBAT, Dementia, PPM (watch BP) Restrictions Weight  Bearing Restrictions Per Provider Order: Yes RLE Weight Bearing Per Provider Order: Weight bearing as tolerated LLE Weight Bearing Per Provider Order: Weight bearing as tolerated  Pain: Unrated pain reported in RLE, rest breaks, pain meds and distraction all used as pain mgmt strategies.    Therapy/Group: Individual Therapy  Mollie Anger [facility] 11/02/2023, 12:15 PM

## 2023-11-02 NOTE — Progress Notes (Addendum)
 Increase in confusion this morning. Bright affect and in positive mood. However, expressed to her husband being in a restaurant on the phone. Able to be reoriented that she is in the hospital. Acknowledges the month is April and it is the middle of April. However, is aware of her wedding anniversary being the last month of April. Is able to correctly identify the date and year of her birthday but identified the incorrect month stating her birthday was in April not January. Is aware of it being morning and aware of her name.

## 2023-11-02 NOTE — Progress Notes (Addendum)
 PROGRESS NOTE   Subjective/Complaints:  BP somewhat elevated overnight, otherwise mostly normotensive.  Other vitals stable, afebrile.   Nursing reporting confusion overnight.  Husband and son in room, husband states that since rehab admission she will intermittently call with confusion regarding place and situation.  Ongoing high-volume caths, no improvement in urinary incontinence.  Discussed with Ortho, reported not uncommon with pelvic ring fractures and agree with Foley placement and outpatient urology follow-up.  Reports hip pain is much improved today  ROS: Limited due to cognition.. Denies CP, SOB, abd pain, N/V/D/C, or any other complaints at this time.   + R hip/pelvis area pain -- improved + Incontinence--ongoing   Objective:   US  RENAL Result Date: 11/01/2023 CLINICAL DATA:  Urinary retention EXAM: RENAL / URINARY TRACT ULTRASOUND COMPLETE COMPARISON:  CT abdomen 10/17/2023 FINDINGS: Right Kidney: Renal measurements: 9.5 by 4.5 by 4.9 cm = volume: 110 mL. Echogenicity within normal limits. No mass or hydronephrosis visualized. Left Kidney: Renal measurements: 10.5 by 5.1 by 4.3 cm = volume: 119 mL. Echogenicity within normal limits. No mass or hydronephrosis visualized. Incidental left peripelvic cysts. Bladder: Appears normal for degree of bladder distention. Bilateral ureteral jets visualized. Other: None. IMPRESSION: 1. No significant renal abnormality observed. 2. Incidental small left peripelvic cysts. Electronically Signed   By: Freida Jes M.D.   On: 11/01/2023 17:53   DG Abd 1 View Result Date: 10/31/2023 CLINICAL DATA:  Constipation. EXAM: ABDOMEN - 1 VIEW COMPARISON:  None Available. FINDINGS: Large amount of colonic stool identified in the upper abdomen, transverse colon. Less distally. There is some air in the rectum. Scattered small bowel gas. No frank obstruction. There are 3 linear metallic foci  overlying loops of colon in the right upper quadrant. Please correlate for clips or other process. Scattered vascular calcifications. Sternal wires are seen at the edge of the imaging field. The most inferior sternal wire is fractured. Pacemaker leads as well. There is a known fracture along the medial left pubic bone. Is also a bony excrescence from the iliac crest laterally on the left. Possible osteo chondroma. Unchanged from prior pelvic x-ray of 10/17/2023. degenerative changes of the spine. IMPRESSION: Nonspecific bowel gas pattern. Significant stool along the ascending and transverse colon. Three metallic foci overlie the right upper quadrant, right side of the colon. Please correlate for any known clips or instrumentation. Known left-sided pubic bone fracture. Electronically Signed   By: Adrianna Horde M.D.   On: 10/31/2023 16:19     Recent Labs    11/01/23 0603  WBC 6.3  HGB 9.5*  HCT 29.4*  PLT 577*    Recent Labs    11/01/23 0603  NA 135  K 3.8  CL 102  CO2 26  GLUCOSE 105*  BUN 12  CREATININE 0.65  CALCIUM  8.7*      Intake/Output Summary (Last 24 hours) at 11/02/2023 0945 Last data filed at 11/02/2023 0751 Gross per 24 hour  Intake 360 ml  Output 2429 ml  Net -2069 ml        Physical Exam: Vital Signs Blood pressure 123/63, pulse 88, temperature 98.2 F (36.8 C), resp. rate 18, height 5\' 8"  (1.727 m), weight  79.4 kg, SpO2 98%.    General: No apparent distress, laying in bed. HEENT: Head is normocephalic, atraumatic, sclera anicteric, oral mucosa pink and moist Neck: Supple without JVD or lymphadenopathy Heart: Reg rate and rhythm. No murmurs rubs or gallops Chest: CTA bilaterally without wheezes, rales, or rhonchi; no distress Abdomen: Soft, non-tender, non-distended, bowel sounds positive.  Psych: Pt's affect is appropriate. Pt is cooperative Skin: Clean and intact without signs of breakdown  Musculoskeletal:      R greater trochanter and lateral thigh  TTP, no apparent edema or deformity.  Improved tolerance of logroll internally/externally and range of motion testing.      Neuro:   Awake, alert, and oriented x 4. Moderate memory and cognitive deficits, poor insight into current condition.--Ongoing.  No apparent delusions or hallucinations.  Cranial nerve exam negative. Strength 4 out of 5 throughout-except 3+ out of 5 right hip flexor due to pain No obvious sensory deficits No ataxia No coordination deficits or tremor No tone     Assessment/Plan: 1. Functional deficits which require 3+ hours per day of interdisciplinary therapy in a comprehensive inpatient rehab setting. Physiatrist is providing close team supervision and 24 hour management of active medical problems listed below. Physiatrist and rehab team continue to assess barriers to discharge/monitor patient progress toward functional and medical goals  Care Tool:  Bathing    Body parts bathed by patient: Right arm, Chest, Left arm, Abdomen, Right upper leg, Left upper leg, Right lower leg, Left lower leg, Face   Body parts bathed by helper: Front perineal area, Buttocks     Bathing assist Assist Level: Minimal Assistance - Patient > 75%     Upper Body Dressing/Undressing Upper body dressing   What is the patient wearing?: Bra, Pull over shirt    Upper body assist Assist Level: Set up assist    Lower Body Dressing/Undressing Lower body dressing      What is the patient wearing?: Underwear/pull up, Pants     Lower body assist Assist for lower body dressing: Maximal Assistance - Patient 25 - 49%     Toileting Toileting    Toileting assist Assist for toileting: Maximal Assistance - Patient 25 - 49%     Transfers Chair/bed transfer  Transfers assist     Chair/bed transfer assist level: Minimal Assistance - Patient > 75%     Locomotion Ambulation   Ambulation assist   Ambulation activity did not occur: Safety/medical concerns  Assist level:  Minimal Assistance - Patient > 75% Assistive device: Walker-rolling Max distance: 115'   Walk 10 feet activity   Assist  Walk 10 feet activity did not occur: Safety/medical concerns  Assist level: Minimal Assistance - Patient > 75% Assistive device: Walker-rolling   Walk 50 feet activity   Assist Walk 50 feet with 2 turns activity did not occur: Safety/medical concerns (pain)  Assist level: Minimal Assistance - Patient > 75% Assistive device: Walker-rolling    Walk 150 feet activity   Assist Walk 150 feet activity did not occur: Safety/medical concerns  Assist level: Minimal Assistance - Patient > 75% Assistive device: Walker-rolling    Walk 10 feet on uneven surface  activity   Assist Walk 10 feet on uneven surfaces activity did not occur: Safety/medical concerns         Wheelchair     Assist Is the patient using a wheelchair?: Yes Type of Wheelchair: Manual    Wheelchair assist level: Dependent - Patient 0%      Wheelchair  50 feet with 2 turns activity    Assist        Assist Level: Dependent - Patient 0%   Wheelchair 150 feet activity     Assist      Assist Level: Dependent - Patient 0%   Blood pressure 123/63, pulse 88, temperature 98.2 F (36.8 C), resp. rate 18, height 5\' 8"  (1.727 m), weight 79.4 kg, SpO2 98%.  Medical Problem List and Plan: 1. Functional deficits secondary to left sacral ala and superior pubic rami fracture.  Weightbearing as tolerated per Dr. Guyann Leitz             -patient may shower -ELOS/Goals: 10-14 days, supervision goals with PT, and sup/min assist with OT and SLP-dispo to SNF    4/29: Setup-SPV UB, Md-Max A LBD. Needing a LOT of encouragement for standing tasks and transfers - cog is limiting. Husband not safe to assist - unstable with practicing with PT/OT. Son not participating in self-care activitied but can assist some. Plan for dispo to SNF    5-2: Discussed new urinary retention with Ortho PA;  repeat pelvic x-rays today to ensure fracture stability, but not uncommon around expected as a result of this injury.  No need for CT.  2.  Antithrombotics: -DVT/anticoagulation:  Pharmaceutical: Lovenox  30mg  BID.  Check vascular study-negative              -antiplatelet therapy: Aspirin  81 mg daily  3. Pain Management: Lidoderm  patch, oxycodone  as needed  -4/24 Check xray pelvis/Hip right-- stable  -Muscle rub -10/27/23 xray stable; PT noting that she doesn't ask for PRNs, and mostly has pain during sessions; will schedule 500mg  tylenol  TID and see if that helps for sessions.  4-28: Ongoing 8 out of 10 pain overnight, with significant hypertension associated.  Increase Tylenol  to 1000 mg 3 times daily, schedule oxycodone  5 mg 3 times daily, reduce as needed oxycodone  to 2.5 to 5 mg every 6 hours as needed. 4-29: Does feel pain is improved, patient with poor insight about quality of pain and changes.  Continue current regimen, hesitant to further increase due to cognitive deficits. 4/30: DC standing oxycodone  due to concerns for contribution to urinary retention.  Add Voltaren  gel 4 times daily to hips and Robaxin  as needed. 5-1: DC oxycodone  for ongoing urinary retention and constipation, changed to tramadol  50 to 100 mg every 6 hours as needed for pain. 5-2: Pain better controlled.  Continue with tramadol  as needed, may reduce to Tylenol  if pain remains good over the weekend.  Cannot take NSAIDs due to aspirin  allergy.  4. Mood/Behavior/Sleep: Namenda  10 mg twice daily, Cymbalta  60 mg twice daily             -antipsychotic agents: N/A  - 5-2: Family reporting frequent phone calls with patient confusion, waxing and waning, consistent with delirium.  Described risk factors and treatment, placed delirium precautions.  5. Neuropsych/cognition: This patient is not quite capable of making decisions on her own behalf. 6. Skin/Wound Care: Routine skin checks 7. Fluids/Electrolytes/Nutrition: Routine  in and outs with follow-up chemistries, cont vitamins/supplements  - stable 8.  Acute on chronic anemia/history of GI bleed/colon cancer.  Patient receives outpatient iron  infusions.  Continue iron  supplement.  Follow-up per hematology services Dr.Gorsuch  -4/22 HGB stable 8.3  - Stable at 8-9  9.  Epilepsy.  Tegretol  600 mg twice daily as well as zarontin  500 mg twice daily. 10.  CAD/CABG/history of stents/PPM.  Continue low-dose aspirin .  Follow-up cardiology service Dr.  [place].  Continue Imdur  15 mg daily. Denies CP 11.  Hypothyroidism.  Synthroid  50mcg daily 12.  Hyponatremia.  Sodium chloride  tablets 1 g 3 times daily.  Follow-up chemistries tomorrow  -4/24 Up to 135  4-28: 132 today, seems about baseline.  Monitor in 2 to 3 days.  5-1: 135.  Reduce salt tabs to 1 g twice daily.  Repeat Monday.  13.Hypertension. Norvasc  5 mg daily. Monitor with increased mobility             -bp borderline at present.  4/22 increase norvasc  to 7.5  4/24-25 fair control, continue current regimen and monitor  -4/26-27/25 BPs variable but fair, monitor.   4-28: Significant hypertension overnight, likely associated with pain.  Monitor today with increased regimen as above.  Increase Norvasc  to 10 mg for tomorrow a.m.; benefit from rescheduling to nightly  4/29: Blood pressure improved today. Monitor with increase 2-3 days--improving  4/30: Staying in 140s to 150s over 60s, monitor 1 more day and may add ARB if no improvement (has had cough with ACE in the past).  Per pharmacy, Norvasc  may also contribute to urinary retention, so may have to adjust this  5-1: Some intermittent hypertension, but now normotensive mostly.  Continue current regimen.  Vitals:   10/30/23 1604 10/30/23 1952 10/31/23 0526 10/31/23 0802  BP: (!) 153/64 (!) 144/65 (!) 144/66 (!) 142/69   10/31/23 1410 10/31/23 2020 11/01/23 0510 11/01/23 0849  BP: (!) 150/65 137/63 (!) 154/64 130/61   11/01/23 2014 11/02/23 0547 11/02/23 0801  11/02/23 0802  BP: (!) 151/74 (!) 140/75 123/63 123/63     14. Fibromyalgia   -Hx of fibromyalgia noted.   -Will start gabapentin  100mg  BID  -Increase gabapentin  to 100mg  TID--improved  15. Urine incontinence/UTI - Discussed with husband reports that she did not have frequent incontinence until recent hospitalization  -4/24 Check UA and culture -10/27/23 U/A c/w UTI, started on keflex  250mg  QID on 10/25/23 PM, EOT 10/30/23 PM-- UCx pending, will follow -10/28/23 UCx with >100k CFU E.coli, 40k CFU proteus mirabilus, susceptibilities pending; cont keflex  for now, await susceptibilities  4-28: Both species pansensitive, continue Keflex   4-29.  Multiple, large-volume straight caths overnight, 2x >900.  Started on Flomax  0.4 mg daily.  Every 6 hour PVR/bladder scans.  4/30: Keflex  finished. Retention ongoing. Repeat UA negative, increase flomax  to 0.8 mg at bedtime.  See #17 below--constipation and oxycodone  likely contributors  5-1: Ongoing need for intermittent straight caths, pending renal ultrasound today given repeated high-volume caths may need to place Foley catheter.  Family made aware.  DC oxycodone  as above, patient had large bowel movement yesterday.  5-2: Renal ultrasound normal.  Continues with high-volume caths.  Discussed with family, will place Foley today, repeat UA due to new foul odor, arrange outpatient urology follow-up.  Per Ortho, not uncommon with pelvic ring fractures.   16  Hypercholesterolemia -Husband report he brought it [place] from home, last dose 4/13. Called pharmacy to discuss  57. Bowel function - 4/24 add Senokot S 2 tabs QHS, continue MiraLAX  twice daily, colace 100mg  BID   4/30: Increase sennakot s tro 2 tabs BID fpr small Bms; KUB today for constipation contributing to urinary retention --large stool burden seen in proximal and transverse colon, ordered nursing to give sorbitol  and follow-up with soapsuds enema if no result.  5-1: Large bowel movement with  sorbitol  yesterday; patient refused enema.  LOS: 11 days A FACE TO FACE EVALUATION WAS PERFORMED  Bea Lime 11/02/2023, 9:45  AM

## 2023-11-03 DIAGNOSIS — S3282XD Multiple fractures of pelvis without disruption of pelvic ring, subsequent encounter for fracture with routine healing: Secondary | ICD-10-CM | POA: Diagnosis not present

## 2023-11-03 DIAGNOSIS — I10 Essential (primary) hypertension: Secondary | ICD-10-CM | POA: Diagnosis not present

## 2023-11-03 DIAGNOSIS — N390 Urinary tract infection, site not specified: Secondary | ICD-10-CM | POA: Diagnosis not present

## 2023-11-03 LAB — CBC WITH DIFFERENTIAL/PLATELET
Abs Immature Granulocytes: 0 10*3/uL (ref 0.00–0.07)
Basophils Absolute: 0 10*3/uL (ref 0.0–0.1)
Basophils Relative: 1 %
Eosinophils Absolute: 0.2 10*3/uL (ref 0.0–0.5)
Eosinophils Relative: 4 %
HCT: 30.3 % — ABNORMAL LOW (ref 36.0–46.0)
Hemoglobin: 9.8 g/dL — ABNORMAL LOW (ref 12.0–15.0)
Lymphocytes Relative: 15 %
Lymphs Abs: 0.7 10*3/uL (ref 0.7–4.0)
MCH: 29.9 pg (ref 26.0–34.0)
MCHC: 32.3 g/dL (ref 30.0–36.0)
MCV: 92.4 fL (ref 80.0–100.0)
Monocytes Absolute: 0.2 10*3/uL (ref 0.1–1.0)
Monocytes Relative: 4 %
Neutro Abs: 3.6 10*3/uL (ref 1.7–7.7)
Neutrophils Relative %: 76 %
Platelets: 545 10*3/uL — ABNORMAL HIGH (ref 150–400)
RBC: 3.28 MIL/uL — ABNORMAL LOW (ref 3.87–5.11)
RDW: 21.3 % — ABNORMAL HIGH (ref 11.5–15.5)
WBC: 4.8 10*3/uL (ref 4.0–10.5)
nRBC: 0 % (ref 0.0–0.2)
nRBC: 0 /100{WBCs}

## 2023-11-03 LAB — COMPREHENSIVE METABOLIC PANEL WITH GFR
ALT: 17 U/L (ref 0–44)
AST: 20 U/L (ref 15–41)
Albumin: 2.8 g/dL — ABNORMAL LOW (ref 3.5–5.0)
Alkaline Phosphatase: 115 U/L (ref 38–126)
Anion gap: 9 (ref 5–15)
BUN: 11 mg/dL (ref 8–23)
CO2: 26 mmol/L (ref 22–32)
Calcium: 8.7 mg/dL — ABNORMAL LOW (ref 8.9–10.3)
Chloride: 99 mmol/L (ref 98–111)
Creatinine, Ser: 0.66 mg/dL (ref 0.44–1.00)
GFR, Estimated: >60 mL/min (ref >60)
Glucose, Bld: 114 mg/dL — ABNORMAL HIGH (ref 70–99)
Potassium: 4 mmol/L (ref 3.5–5.1)
Sodium: 134 mmol/L — ABNORMAL LOW (ref 135–145)
Total Bilirubin: 0.4 mg/dL (ref 0.0–1.2)
Total Protein: 6.1 g/dL — ABNORMAL LOW (ref 6.5–8.1)

## 2023-11-03 MED ORDER — CEFADROXIL 500 MG PO CAPS
500.0000 mg | ORAL_CAPSULE | Freq: Two times a day (BID) | ORAL | Status: DC
  Administered 2023-11-03 – 2023-11-06 (×6): 500 mg via ORAL
  Filled 2023-11-03 (×6): qty 1

## 2023-11-03 NOTE — Plan of Care (Signed)
  Problem: RH BOWEL ELIMINATION Goal: RH STG MANAGE BOWEL WITH ASSISTANCE Description: STG Manage Bowel with supervision Assistance. Outcome: Progressing   Problem: RH SAFETY Goal: RH STG ADHERE TO SAFETY PRECAUTIONS W/ASSISTANCE/DEVICE Description: STG Adhere to Safety Precautions With  supervision Assistance/Device. Outcome: Progressing

## 2023-11-03 NOTE — Progress Notes (Signed)
 PROGRESS NOTE   Subjective/Complaints:  Pt doing well, slept well, pain well managed overall, LBM yesterday, foley placed yesterday and doing fine. No complaints or concerns.   ROS: Limited due to cognition.. Denies CP, SOB, abd pain, N/V/D/C, or any other complaints at this time.   + R hip/pelvis area pain -- improved + Incontinence--ongoing   Objective:   DG Pelvis 1-2 Views Result Date: 11/02/2023 CLINICAL DATA:  Pelvic fracture.  Right hip pain. EXAM: PELVIS - 1-2 VIEW COMPARISON:  One-view abdomen and pelvis. CT of the abdomen pelvis 10/17/2023. FINDINGS: Is scratched at left superior pubic ramus and pubic symphysis fracture is again noted. No new fractures are present. IMPRESSION: 1. Left superior pubic ramus and pubic symphysis fractures. 2. No new fractures. Electronically Signed   By: Audree Leas M.D.   On: 11/02/2023 14:01   US  RENAL Result Date: 11/01/2023 CLINICAL DATA:  Urinary retention EXAM: RENAL / URINARY TRACT ULTRASOUND COMPLETE COMPARISON:  CT abdomen 10/17/2023 FINDINGS: Right Kidney: Renal measurements: 9.5 by 4.5 by 4.9 cm = volume: 110 mL. Echogenicity within normal limits. No mass or hydronephrosis visualized. Left Kidney: Renal measurements: 10.5 by 5.1 by 4.3 cm = volume: 119 mL. Echogenicity within normal limits. No mass or hydronephrosis visualized. Incidental left peripelvic cysts. Bladder: Appears normal for degree of bladder distention. Bilateral ureteral jets visualized. Other: None. IMPRESSION: 1. No significant renal abnormality observed. 2. Incidental small left peripelvic cysts. Electronically Signed   By: Freida Jes M.D.   On: 11/01/2023 17:53     Recent Labs    11/01/23 0603 11/03/23 0559  WBC 6.3 4.8  HGB 9.5* 9.8*  HCT 29.4* 30.3*  PLT 577* 545*    Recent Labs    11/01/23 0603 11/03/23 0559  NA 135 134*  K 3.8 4.0  CL 102 99  CO2 26 26  GLUCOSE 105* 114*  BUN 12 11   CREATININE 0.65 0.66  CALCIUM  8.7* 8.7*      Intake/Output Summary (Last 24 hours) at 11/03/2023 1303 Last data filed at 11/03/2023 1253 Gross per 24 hour  Intake 485 ml  Output 2150 ml  Net -1665 ml        Physical Exam: Vital Signs Blood pressure (!) 141/71, pulse 91, temperature 98.6 F (37 C), temperature source Oral, resp. rate 18, height 5\' 8"  (1.727 m), weight 79.4 kg, SpO2 98%.    General: No apparent distress, sitting up in bed. HEENT: Head is normocephalic, atraumatic, sclera anicteric, oral mucosa pink and moist Neck: Supple without JVD or lymphadenopathy Heart: Reg rate and rhythm. No murmurs rubs or gallops Chest: CTA bilaterally without wheezes, rales, or rhonchi; no distress Abdomen: Soft, non-tender, non-distended, bowel sounds positive.  Psych: Pt's affect is flat. Pt is cooperative Skin: Clean and intact without signs of breakdown  PRIOR EXAMS: Musculoskeletal:      R greater trochanter and lateral thigh TTP, no apparent edema or deformity.  Improved tolerance of logroll internally/externally and range of motion testing.      Neuro:   Awake, alert, and oriented x 4. Moderate memory and cognitive deficits, poor insight into current condition.--Ongoing.  No apparent delusions or hallucinations.  Cranial nerve  exam negative. Strength 4 out of 5 throughout-except 3+ out of 5 right hip flexor due to pain No obvious sensory deficits No ataxia No coordination deficits or tremor No tone     Assessment/Plan: 1. Functional deficits which require 3+ hours per day of interdisciplinary therapy in a comprehensive inpatient rehab setting. Physiatrist is providing close team supervision and 24 hour management of active medical problems listed below. Physiatrist and rehab team continue to assess barriers to discharge/monitor patient progress toward functional and medical goals  Care Tool:  Bathing    Body parts bathed by patient: Right arm, Left arm, Chest,  Abdomen, Front perineal area, Buttocks, Right upper leg, Left upper leg, Right lower leg, Left lower leg, Face   Body parts bathed by helper: Front perineal area, Buttocks     Bathing assist Assist Level: Contact Guard/Touching assist     Upper Body Dressing/Undressing Upper body dressing   What is the patient wearing?: Bra, Pull over shirt    Upper body assist Assist Level: Set up assist    Lower Body Dressing/Undressing Lower body dressing      What is the patient wearing?: Underwear/pull up, Pants     Lower body assist Assist for lower body dressing: Minimal Assistance - Patient > 75%     Toileting Toileting    Toileting assist Assist for toileting: Minimal Assistance - Patient > 75%     Transfers Chair/bed transfer  Transfers assist     Chair/bed transfer assist level: Contact Guard/Touching assist     Locomotion Ambulation   Ambulation assist   Ambulation activity did not occur: Safety/medical concerns  Assist level: Contact Guard/Touching assist Assistive device: Walker-rolling Max distance: 120'   Walk 10 feet activity   Assist  Walk 10 feet activity did not occur: Safety/medical concerns  Assist level: Contact Guard/Touching assist Assistive device: Walker-rolling   Walk 50 feet activity   Assist Walk 50 feet with 2 turns activity did not occur: Safety/medical concerns (pain)  Assist level: Contact Guard/Touching assist Assistive device: Walker-rolling    Walk 150 feet activity   Assist Walk 150 feet activity did not occur: Safety/medical concerns  Assist level: Minimal Assistance - Patient > 75% Assistive device: Walker-rolling    Walk 10 feet on uneven surface  activity   Assist Walk 10 feet on uneven surfaces activity did not occur: Safety/medical concerns         Wheelchair     Assist Is the patient using a wheelchair?: Yes Type of Wheelchair: Manual    Wheelchair assist level: Dependent - Patient 0%       Wheelchair 50 feet with 2 turns activity    Assist        Assist Level: Dependent - Patient 0%   Wheelchair 150 feet activity     Assist      Assist Level: Dependent - Patient 0%   Blood pressure (!) 141/71, pulse 91, temperature 98.6 F (37 C), temperature source Oral, resp. rate 18, height 5\' 8"  (1.727 m), weight 79.4 kg, SpO2 98%.  Medical Problem List and Plan: 1. Functional deficits secondary to left sacral ala and superior pubic rami fracture.  Weightbearing as tolerated per Dr. Guyann Leitz             -patient may shower -ELOS/Goals: 10-14 days, supervision goals with PT, and sup/min assist with OT and SLP-dispo to SNF   4/29: Setup-SPV UB, Md-Max A LBD. Needing a LOT of encouragement for standing tasks and transfers - cog is  limiting. Husband not safe to assist - unstable with practicing with PT/OT. Son not participating in self-care activitied but can assist some. Plan for dispo to SNF   5-2: Discussed new urinary retention with Ortho PA; repeat pelvic x-rays today to ensure fracture stability, but not uncommon around expected as a result of this injury.  No need for CT.  2.  Antithrombotics: -DVT/anticoagulation:  Pharmaceutical: Lovenox  30mg  BID.  Check vascular study-negative              -antiplatelet therapy: Aspirin  81 mg daily  3. Pain Management: Lidoderm  patch, oxycodone  as needed  -4/24 Check xray pelvis/Hip right-- stable  -Muscle rub -10/27/23 xray stable; PT noting that she doesn't ask for PRNs, and mostly has pain during sessions; will schedule 500mg  tylenol  TID and see if that helps for sessions.  4-28: Ongoing 8 out of 10 pain overnight, with significant hypertension associated.  Increase Tylenol  to 1000 mg 3 times daily, schedule oxycodone  5 mg 3 times daily, reduce as needed oxycodone  to 2.5 to 5 mg every 6 hours as needed. 4-29: Does feel pain is improved, patient with poor insight about quality of pain and changes.  Continue current regimen,  hesitant to further increase due to cognitive deficits. 4/30: DC standing oxycodone  due to concerns for contribution to urinary retention.  Add Voltaren  gel 4 times daily to hips and Robaxin  as needed. 5-1: DC oxycodone  for ongoing urinary retention and constipation, changed to tramadol  50 to 100 mg every 6 hours as needed for pain. 5-2: Pain better controlled.  Continue with tramadol  as needed, may reduce to Tylenol  if pain remains good over the weekend.  Cannot take NSAIDs due to aspirin  allergy.  4. Mood/Behavior/Sleep: Namenda  10 mg twice daily, Cymbalta  60 mg twice daily             -antipsychotic agents: N/A - 5-2: Family reporting frequent phone calls with patient confusion, waxing and waning, consistent with delirium.  Described risk factors and treatment, placed delirium precautions.  5. Neuropsych/cognition: This patient is not quite capable of making decisions on her own behalf. 6. Skin/Wound Care: Routine skin checks 7. Fluids/Electrolytes/Nutrition: Routine in and outs with follow-up chemistries, cont vitamins/supplements  - stable 8.  Acute on chronic anemia/history of GI bleed/colon cancer.  Patient receives outpatient iron  infusions.  Continue iron  supplement.  Follow-up per hematology services Dr.Gorsuch  -4/22 HGB stable 8.3  - Stable at 8-9  9.  Epilepsy.  Tegretol  600 mg twice daily as well as zarontin  500 mg twice daily. 10.  CAD/CABG/history of stents/PPM.  Continue low-dose aspirin .  Follow-up cardiology service Dr. [place].  Continue Imdur  15 mg daily. Denies CP 11.  Hypothyroidism.  Synthroid  50mcg daily 12.  Hyponatremia.  Sodium chloride  tablets 1 g 3 times daily.  Follow-up chemistries tomorrow  -4/24 Up to 135  4-28: 132 today, seems about baseline.  Monitor in 2 to 3 days.  5-1: 135.  Reduce salt tabs to 1 g twice daily.  Repeat Monday.  -11/03/23 Na 134, monitor with Monday labs.   13.Hypertension. Norvasc  5 mg daily. Monitor with increased mobility              -bp borderline at present.  4/22 increase norvasc  to 7.5  4/24-25 fair control, continue current regimen and monitor  -4/26-27/25 BPs variable but fair, monitor.  4-28: Significant hypertension overnight, likely associated with pain.  Monitor today with increased regimen as above.  Increase Norvasc  to 10 mg for tomorrow a.m.; benefit from  rescheduling to nightly 4/29: Blood pressure improved today. Monitor with increase 2-3 days--improving 4/30: Staying in 140s to 150s over 60s, monitor 1 more day and may add ARB if no improvement (has had cough with ACE in the past).  Per pharmacy, Norvasc  may also contribute to urinary retention, so may have to adjust this 5-1: Some intermittent hypertension, but now normotensive mostly.  Continue current regimen. -11/03/23 BPs variable, but close to normotension mostly; monitor  Vitals:   10/31/23 1410 10/31/23 2020 11/01/23 0510 11/01/23 0849  BP: (!) 150/65 137/63 (!) 154/64 130/61   11/01/23 2014 11/02/23 0547 11/02/23 0801 11/02/23 0802  BP: (!) 151/74 (!) 140/75 123/63 123/63   11/02/23 1346 11/02/23 1939 11/03/23 0415 11/03/23 1253  BP: (!) 156/68 (!) 152/68 131/64 (!) 141/71     14. Fibromyalgia   -Hx of fibromyalgia noted.   -Will start gabapentin  100mg  BID  -Increase gabapentin  to 100mg  TID--improved  15. Urine incontinence/UTI - Discussed with husband reports that she did not have frequent incontinence until recent hospitalization  -4/24 Check UA and culture -10/27/23 U/A c/w UTI, started on keflex  250mg  QID on 10/25/23 PM, EOT 10/30/23 PM-- UCx pending, will follow -10/28/23 UCx with >100k CFU E.coli, 40k CFU proteus mirabilus, susceptibilities pending; cont keflex  for now, await susceptibilities  4-28: Both species pansensitive, continue Keflex  4-29.  Multiple, large-volume straight caths overnight, 2x >900.  Started on Flomax  0.4 mg daily.  Every 6 hour PVR/bladder scans. 4/30: Keflex  finished. Retention ongoing. Repeat UA negative, increase  flomax  to 0.8 mg at bedtime.  See #17 below--constipation and oxycodone  likely contributors 5-1: Ongoing need for intermittent straight caths, pending renal ultrasound today given repeated high-volume caths may need to place Foley catheter.  Family made aware.  DC oxycodone  as above, patient had large bowel movement yesterday. 5-2: Renal ultrasound normal.  Continues with high-volume caths.  Discussed with family, will place Foley today, repeat UA due to new foul odor, arrange outpatient urology follow-up.  Per Ortho, not uncommon with pelvic ring fractures. -11/03/23 UCx from yesterday with E.coli >100k CFU. Previously on Keflex , consulted pharmacy, could do cefodroxil 500mg  BID x5 days (10 doses starting today)   16  Hypercholesterolemia -Husband report he brought it [place] from home, last dose 4/13. Called pharmacy to discuss  60. Bowel function - 4/24 add Senokot S 2 tabs QHS, continue MiraLAX  twice daily, colace 100mg  BID  4/30: Increase sennakot s tro 2 tabs BID fpr small Bms; KUB today for constipation contributing to urinary retention --large stool burden seen in proximal and transverse colon, ordered nursing to give sorbitol  and follow-up with soapsuds enema if no result. 5-1: Large bowel movement with sorbitol  yesterday; patient refused enema. -11/03/23 LBM yesterday, monitor  I spent >32mins performing patient care related activities, including face to face time, documentation time, results review/med management, discussion of meds with pharmacy, and overall coordination of care.    LOS: 12 days A FACE TO FACE EVALUATION WAS PERFORMED  [address] 11/03/2023, 1:03 PM

## 2023-11-04 DIAGNOSIS — S3282XD Multiple fractures of pelvis without disruption of pelvic ring, subsequent encounter for fracture with routine healing: Secondary | ICD-10-CM | POA: Diagnosis not present

## 2023-11-04 DIAGNOSIS — K5901 Slow transit constipation: Secondary | ICD-10-CM | POA: Diagnosis not present

## 2023-11-04 DIAGNOSIS — N390 Urinary tract infection, site not specified: Secondary | ICD-10-CM | POA: Diagnosis not present

## 2023-11-04 LAB — URINE CULTURE: Culture: >=100000 — AB

## 2023-11-04 NOTE — Progress Notes (Signed)
 PROGRESS NOTE   Subjective/Complaints:  Rachel Clark doing well again today, slept well, pain doing ok, LBM yesterday, foley doing fine without concerns. No complaints or concerns.   ROS: Limited due to cognition.. Denies CP, SOB, abd pain, N/V/D/C, or any other complaints at this time.   + R hip/pelvis area pain -- improved + Incontinence--ongoing   Objective:   No results found.    Recent Labs    11/03/23 0559  WBC 4.8  HGB 9.8*  HCT 30.3*  PLT 545*    Recent Labs    11/03/23 0559  NA 134*  K 4.0  CL 99  CO2 26  GLUCOSE 114*  BUN 11  CREATININE 0.66  CALCIUM  8.7*      Intake/Output Summary (Last 24 hours) at 11/04/2023 1011 Last data filed at 11/04/2023 0727 Gross per 24 hour  Intake 717 ml  Output 2450 ml  Net -1733 ml        Physical Exam: Vital Signs Blood pressure 137/62, pulse 86, temperature 97.9 F (36.6 C), temperature source Oral, resp. rate 18, height 5\' 8"  (1.727 m), weight 79.4 kg, SpO2 98%.    General: No apparent distress, sitting up in bed. HEENT: Head is normocephalic, atraumatic, sclera anicteric, oral mucosa pink and moist Neck: Supple without JVD or lymphadenopathy Heart: Reg rate and rhythm. No murmurs rubs or gallops Chest: CTA bilaterally without wheezes, rales, or rhonchi; no distress Abdomen: Soft, non-tender, non-distended, bowel sounds positive.  Psych: Rachel Clark's affect is flat. Rachel Clark is cooperative Skin: Clean and intact without signs of breakdown  PRIOR EXAMS: Musculoskeletal:      R greater trochanter and lateral thigh TTP, no apparent edema or deformity.  Improved tolerance of logroll internally/externally and range of motion testing.      Neuro:   Awake, alert, and oriented x 4. Moderate memory and cognitive deficits, poor insight into current condition.--Ongoing.  No apparent delusions or hallucinations.  Cranial nerve exam negative. Strength 4 out of 5 throughout-except  3+ out of 5 right hip flexor due to pain No obvious sensory deficits No ataxia No coordination deficits or tremor No tone     Assessment/Plan: 1. Functional deficits which require 3+ hours per day of interdisciplinary therapy in a comprehensive inpatient rehab setting. Physiatrist is providing close team supervision and 24 hour management of active medical problems listed below. Physiatrist and rehab team continue to assess barriers to discharge/monitor patient progress toward functional and medical goals  Care Tool:  Bathing    Body parts bathed by patient: Right arm, Left arm, Chest, Abdomen, Front perineal area, Buttocks, Right upper leg, Left upper leg, Right lower leg, Left lower leg, Face   Body parts bathed by helper: Front perineal area, Buttocks     Bathing assist Assist Level: Contact Guard/Touching assist     Upper Body Dressing/Undressing Upper body dressing   What is the patient wearing?: Bra, Pull over shirt    Upper body assist Assist Level: Set up assist    Lower Body Dressing/Undressing Lower body dressing      What is the patient wearing?: Underwear/pull up, Pants     Lower body assist Assist for lower body dressing: Minimal  Assistance - Patient > 75%     Editor, commissioning assist Assist for toileting: Minimal Assistance - Patient > 75%     Transfers Chair/bed transfer  Transfers assist     Chair/bed transfer assist level: Contact Guard/Touching assist     Locomotion Ambulation   Ambulation assist   Ambulation activity did not occur: Safety/medical concerns  Assist level: Contact Guard/Touching assist Assistive device: Walker-rolling Max distance: 120'   Walk 10 feet activity   Assist  Walk 10 feet activity did not occur: Safety/medical concerns  Assist level: Contact Guard/Touching assist Assistive device: Walker-rolling   Walk 50 feet activity   Assist Walk 50 feet with 2 turns activity did not occur:  Safety/medical concerns (pain)  Assist level: Contact Guard/Touching assist Assistive device: Walker-rolling    Walk 150 feet activity   Assist Walk 150 feet activity did not occur: Safety/medical concerns  Assist level: Minimal Assistance - Patient > 75% Assistive device: Walker-rolling    Walk 10 feet on uneven surface  activity   Assist Walk 10 feet on uneven surfaces activity did not occur: Safety/medical concerns         Wheelchair     Assist Is the patient using a wheelchair?: Yes Type of Wheelchair: Manual    Wheelchair assist level: Dependent - Patient 0%      Wheelchair 50 feet with 2 turns activity    Assist        Assist Level: Dependent - Patient 0%   Wheelchair 150 feet activity     Assist      Assist Level: Dependent - Patient 0%   Blood pressure 137/62, pulse 86, temperature 97.9 F (36.6 C), temperature source Oral, resp. rate 18, height 5\' 8"  (1.727 m), weight 79.4 kg, SpO2 98%.  Medical Problem List and Plan: 1. Functional deficits secondary to left sacral ala and superior pubic rami fracture.  Weightbearing as tolerated per Dr. Guyann Leitz             -patient may shower -ELOS/Goals: 10-14 days, supervision goals with Rachel Clark, and sup/min assist with OT and SLP-dispo to SNF   4/29: Setup-SPV UB, Md-Max A LBD. Needing a LOT of encouragement for standing tasks and transfers - cog is limiting. Husband not safe to assist - unstable with practicing with Rachel Clark/OT. Son not participating in self-care activitied but can assist some. Plan for dispo to SNF   5-2: Discussed new urinary retention with Ortho PA; repeat pelvic x-rays today to ensure fracture stability, but not uncommon around expected as a result of this injury.  No need for CT.  2.  Antithrombotics: -DVT/anticoagulation:  Pharmaceutical: Lovenox  30mg  BID.  Check vascular study-negative              -antiplatelet therapy: Aspirin  81 mg daily  3. Pain Management: Lidoderm  patch,  oxycodone  as needed  -4/24 Check xray pelvis/Hip right-- stable  -Muscle rub -10/27/23 xray stable; Rachel Clark noting that she doesn't ask for PRNs, and mostly has pain during sessions; will schedule 500mg  tylenol  TID and see if that helps for sessions.  4-28: Ongoing 8 out of 10 pain overnight, with significant hypertension associated.  Increase Tylenol  to 1000 mg 3 times daily, schedule oxycodone  5 mg 3 times daily, reduce as needed oxycodone  to 2.5 to 5 mg every 6 hours as needed. 4-29: Does feel pain is improved, patient with poor insight about quality of pain and changes.  Continue current regimen, hesitant to further increase due to cognitive deficits.  4/30: DC standing oxycodone  due to concerns for contribution to urinary retention.  Add Voltaren  gel 4 times daily to hips and Robaxin  as needed. 5-1: DC oxycodone  for ongoing urinary retention and constipation, changed to tramadol  50 to 100 mg every 6 hours as needed for pain. 5-2: Pain better controlled.  Continue with tramadol  as needed, may reduce to Tylenol  if pain remains good over the weekend.  Cannot take NSAIDs due to aspirin  allergy.  4. Mood/Behavior/Sleep: Namenda  10 mg twice daily, Cymbalta  60 mg twice daily             -antipsychotic agents: N/A - 5-2: Family reporting frequent phone calls with patient confusion, waxing and waning, consistent with delirium.  Described risk factors and treatment, placed delirium precautions.  5. Neuropsych/cognition: This patient is not quite capable of making decisions on her own behalf. 6. Skin/Wound Care: Routine skin checks 7. Fluids/Electrolytes/Nutrition: Routine in and outs with follow-up chemistries, cont vitamins/supplements  - stable 8.  Acute on chronic anemia/history of GI bleed/colon cancer.  Patient receives outpatient iron  infusions.  Continue iron  supplement.  Follow-up per hematology services Dr.Gorsuch  -4/22 HGB stable 8.3  - Stable at 8-9  9.  Epilepsy.  Tegretol  600 mg twice daily  as well as zarontin  500 mg twice daily. 10.  CAD/CABG/history of stents/PPM.  Continue low-dose aspirin .  Follow-up cardiology service Dr. [place].  Continue Imdur  15 mg daily. Denies CP 11.  Hypothyroidism.  Synthroid  50mcg daily 12.  Hyponatremia.  Sodium chloride  tablets 1 g 3 times daily.  Follow-up chemistries tomorrow  -4/24 Up to 135  4-28: 132 today, seems about baseline.  Monitor in 2 to 3 days.  5-1: 135.  Reduce salt tabs to 1 g twice daily.  Repeat Monday.  -11/03/23 Na 134, monitor with Monday labs.   13.Hypertension. Norvasc  5 mg daily. Monitor with increased mobility             -bp borderline at present.  4/22 increase norvasc  to 7.5  4/24-25 fair control, continue current regimen and monitor  -4/26-27/25 BPs variable but fair, monitor.  4-28: Significant hypertension overnight, likely associated with pain.  Monitor today with increased regimen as above.  Increase Norvasc  to 10 mg for tomorrow a.m.; benefit from rescheduling to nightly 4/29: Blood pressure improved today. Monitor with increase 2-3 days--improving 4/30: Staying in 140s to 150s over 60s, monitor 1 more day and may add ARB if no improvement (has had cough with ACE in the past).  Per pharmacy, Norvasc  may also contribute to urinary retention, so may have to adjust this 5-1: Some intermittent hypertension, but now normotensive mostly.  Continue current regimen. -11/03/23 BPs variable, but close to normotension mostly; monitor  Vitals:   11/01/23 0849 11/01/23 2014 11/02/23 0547 11/02/23 0801  BP: 130/61 (!) 151/74 (!) 140/75 123/63   11/02/23 0802 11/02/23 1346 11/02/23 1939 11/03/23 0415  BP: 123/63 (!) 156/68 (!) 152/68 131/64   11/03/23 1253 11/03/23 1956 11/04/23 0406 11/04/23 0900  BP: (!) 141/71 138/69 119/71 137/62     14. Fibromyalgia   -Hx of fibromyalgia noted.   -Will start gabapentin  100mg  BID  -Increase gabapentin  to 100mg  TID--improved  15. Urine incontinence/UTI - Discussed with husband reports  that she did not have frequent incontinence until recent hospitalization  -4/24 Check UA and culture -10/27/23 U/A c/w UTI, started on keflex  250mg  QID on 10/25/23 PM, EOT 10/30/23 PM-- UCx pending, will follow -10/28/23 UCx with >100k CFU E.coli, 40k CFU proteus mirabilus, susceptibilities pending;  cont keflex  for now, await susceptibilities  4-28: Both species pansensitive, continue Keflex  4-29.  Multiple, large-volume straight caths overnight, 2x >900.  Started on Flomax  0.4 mg daily.  Every 6 hour PVR/bladder scans. 4/30: Keflex  finished. Retention ongoing. Repeat UA negative, increase flomax  to 0.8 mg at bedtime.  See #17 below--constipation and oxycodone  likely contributors 5-1: Ongoing need for intermittent straight caths, pending renal ultrasound today given repeated high-volume caths may need to place Foley catheter.  Family made aware.  DC oxycodone  as above, patient had large bowel movement yesterday. 5-2: Renal ultrasound normal.  Continues with high-volume caths.  Discussed with family, will place Foley today, repeat UA due to new foul odor, arrange outpatient urology follow-up.  Per Ortho, not uncommon with pelvic ring fractures. -11/03/23 UCx from yesterday with E.coli >100k CFU. Previously on Keflex , consulted pharmacy, could do cefodroxil 500mg  BID x5 days (10 doses starting today) -11/04/23 UCx Ecoli showing intermediate resistance to ampicillin but sensitive to all others; continue cefodroxil as prescribed; MAY need to consider exchange of foley after treatment?   16  Hypercholesterolemia -Husband report he brought it [place] from home, last dose 4/13. Called pharmacy to discuss  49. Bowel function - 4/24 add Senokot S 2 tabs QHS, continue MiraLAX  twice daily, colace 100mg  BID  4/30: Increase sennakot s tro 2 tabs BID fpr small Bms; KUB today for constipation contributing to urinary retention --large stool burden seen in proximal and transverse colon, ordered nursing to give sorbitol  and  follow-up with soapsuds enema if no result. 5-1: Large bowel movement with sorbitol  yesterday; patient refused enema. -11/04/23 LBM yesterday, monitor    LOS: 13 days A FACE TO FACE EVALUATION WAS PERFORMED  [address] 11/04/2023, 10:11 AM

## 2023-11-05 LAB — BASIC METABOLIC PANEL WITH GFR
Anion gap: 6 (ref 5–15)
BUN: 12 mg/dL (ref 8–23)
CO2: 27 mmol/L (ref 22–32)
Calcium: 8.6 mg/dL — ABNORMAL LOW (ref 8.9–10.3)
Chloride: 100 mmol/L (ref 98–111)
Creatinine, Ser: 0.61 mg/dL (ref 0.44–1.00)
GFR, Estimated: >60 mL/min (ref >60)
Glucose, Bld: 109 mg/dL — ABNORMAL HIGH (ref 70–99)
Potassium: 4.4 mmol/L (ref 3.5–5.1)
Sodium: 133 mmol/L — ABNORMAL LOW (ref 135–145)

## 2023-11-05 LAB — CBC
HCT: 32.8 % — ABNORMAL LOW (ref 36.0–46.0)
Hemoglobin: 10.6 g/dL — ABNORMAL LOW (ref 12.0–15.0)
MCH: 30.1 pg (ref 26.0–34.0)
MCHC: 32.3 g/dL (ref 30.0–36.0)
MCV: 93.2 fL (ref 80.0–100.0)
Platelets: 514 10*3/uL — ABNORMAL HIGH (ref 150–400)
RBC: 3.52 MIL/uL — ABNORMAL LOW (ref 3.87–5.11)
RDW: 20.3 % — ABNORMAL HIGH (ref 11.5–15.5)
WBC: 5 10*3/uL (ref 4.0–10.5)
nRBC: 0 % (ref 0.0–0.2)

## 2023-11-05 NOTE — Progress Notes (Signed)
 Physical Therapy Session Note  Patient Details  Name: [name] MRN: [id number] Date of Birth: 07/29/1950  Today's Date: 11/05/2023 PT Individual Time: 4098-1191 PT Individual Time Calculation (min): 41 min   Short Term Goals: Week 2:  PT Short Term Goal 1 (Week 2): STGs = LTGs  Skilled Therapeutic Interventions/Progress Updates:    Pt presents in room in recliner, agreeable to PT. Pt denies pain at rest but reports pain with mobility. Session focused on therapeutic activities for tolerance to upright and activity tolerance. Pt completes transfers with CGA throughout session. Pt ambulates 105' from room to nustep with RW, CGA with cues for proximity to RW, pt demonstrating bilateral knees flexed in stance, increased antalgic gait with R stance phase noted. Pt completes nustep for 10 minutes with several short rest breaks due to pt reporting pain in R thigh with prolonged activity that subsides with rest, completed to improve activity tolerance, ROM, and tolerance to weightbearing. Pt ambulates 137' back to room with RW, cues for encouragement as pt self limiting with cues for proximity to RW to decrease antalgic gait with pt demonstrating improving gait pattern. Pt returns to room and comes to sitting in recliner, with all needs within reach, cal light in place and chair alarm donned and activated at end of session.   Therapy Documentation Precautions:  Precautions Precautions: Fall Recall of Precautions/Restrictions: Impaired Precaution/Restrictions Comments: WBAT, Dementia, PPM (watch BP) Restrictions Weight Bearing Restrictions Per Provider Order: Yes RLE Weight Bearing Per Provider Order: Weight bearing as tolerated LLE Weight Bearing Per Provider Order: Weight bearing as tolerated  Therapy/Group: Individual Therapy  Annia Kilts PT, DPT 11/05/2023, 3:52 PM

## 2023-11-05 NOTE — Progress Notes (Signed)
 Patient ID: Rachel Clark, female   DOB: 08/13/1950, 73 y.o.   MRN: [id number]  Sw met withpt husband to discuss preferred Snf.Prefers [facility]. SW will follow-up.  1506- SW spoke with Nikki/Admissions with [facility] about bed offer. Reports to call back tomorrow morning to see about availability.   Norval Been, MSW, LCSW Office: [phone] Cell: [phone] Fax: [phone]

## 2023-11-05 NOTE — Progress Notes (Signed)
 Occupational Therapy Session Note  Patient Details  Name: Rachel Clark MRN: [id number] Date of Birth: 1950/10/13  Today's Date: 11/05/2023 OT Individual Time: 1030-1140 OT Individual Time Calculation (min): 70 min    Short Term Goals: Week 1:  OT Short Term Goal 1 (Week 1): Pt will perform toilet transfer with consistent Min A + LRAD. OT Short Term Goal 1 - Progress (Week 1): Met OT Short Term Goal 2 (Week 1): Pt will thread LB garments with Min A + LRAD. OT Short Term Goal 2 - Progress (Week 1): Met OT Short Term Goal 3 (Week 1): Pt will complete 3/3 toileting activities with Mod A + LRAD. OT Short Term Goal 3 - Progress (Week 1): Met Week 2:  OT Short Term Goal 1 (Week 2): Pt will perform toilet transfer with consistent Min A + LRAD. OT Short Term Goal 2 (Week 2): Pt will dress LB with Mod A + LRAD. OT Short Term Goal 3 (Week 2): Pt will bathe LB with Min A + LRAD.   Skilled Therapeutic Interventions/Progress Updates:    Focus of session on shower level bathing, note pt stating pain in LLE during session but able to push through - per family pt premedicated. Encouargement for participation throughout. Functional mob > bathroom with CGA/MIN for shower transfer, doffed clothing with assist for time management, and performed UB/LB bathing with CGA/MIN for balance during activity but able to lean for buttocks often performing movements and bathing intuitively from routine. Drying off seated and dressing shower level with Supervision UB, MOD for pants/brief and OT assist with socks and shoes for time. Pt able to stand and hold grab bar to assist donning items over hips. Stand pivot shower > wheelchair > recliner with MIN/CGA 2/2 fatigue with RW and note mild L knee buckling but able to catch self. Sink level hair drying and grooming tasks. Note pt very grateful and pleasant after shower. Verbal cues throughout shower for participation, encouargement, and sequencing/problem solving. BP checked  after shower at 172/76, rechecked and 160/80 with HR at 92 - relayed vitals to nursing. Set up pt in recliner alarm on call bell inr each and family present.   Therapy Documentation Precautions:  Precautions Precautions: Fall Recall of Precautions/Restrictions: Impaired Precaution/Restrictions Comments: WBAT, Dementia, PPM (watch BP) Restrictions Weight Bearing Restrictions Per Provider Order: Yes RLE Weight Bearing Per Provider Order: Weight bearing as tolerated LLE Weight Bearing Per Provider Order: Weight bearing as tolerated    Therapy/Group: Individual Therapy  [name] 11/05/2023, 7:28 AM

## 2023-11-05 NOTE — Progress Notes (Signed)
 Occupational Therapy Session Note  Patient Details  Name: Rachel Clark MRN: [id number] Date of Birth: 03-30-1951  Today's Date: 11/05/2023 OT Individual Time: 8295-6213 OT Individual Time Calculation (min): 71 min    Short Term Goals: Week 2:  OT Short Term Goal 1 (Week 2): Pt will perform toilet transfer with consistent Min A + LRAD. OT Short Term Goal 2 (Week 2): Pt will dress LB with Mod A + LRAD. OT Short Term Goal 3 (Week 2): Pt will bathe LB with Min A + LRAD.  Skilled Therapeutic Interventions/Progress Updates:  Pt greeted supine in bed, pt agreeable to OT intervention.      Transfers/bed mobility/functional mobility: pt completed supine>sit with CGA. Pt completed sit>stands with CGA with RW. Cues needed for hand placement and technique. Pt completed ambulatory transfer to bathroom for toileting.   Therapeutic activity:  Pt complete sit>stand therapeutic activity with pt instructed to stand during each turn of connect 4 game. Pt able to complete sit>stands with Rw and CGA. Emphasis on reaching back to chair prior to sitting and making sure to feel chair on back of legs prior to sitting as pt tends to sit prematurely.  Pt completed this requests ~ 80% of the time.   Pt tends to require frequent rest breaks during all mobility tasks.    ADLs:  Grooming: pt completed seated grooming at sink with set- up assist to complete oral care and hair care.  UB dressing:pt donned [place] shirt with set- up assist  LB dressing: pt donned pants and brief with MOD A, pt did assist with pulling pants to waist line in standing but needed assist to thread pants and brief from sitting.  Footwear: pt donned socks with set- up assist as well as shoes  Bathing: pt declined wash up at sink and shower.  Transfers: ambulatory transfer with CGA with RW Toileting: pt completed 3/3 toileting tasks with CGA for balance, supervision needed for safety for thoroughness. Continent bowel void.     Cognition:  pt continues to be pleasantly confused stating" I dont even know where we are." Gently reoriented pt to location and situation                 Ended session with pt supine in bed with all needs within reach and bed alarm activated.                    Therapy Documentation Precautions:  Precautions Precautions: Fall Recall of Precautions/Restrictions: Impaired Precaution/Restrictions Comments: WBAT, Dementia, PPM (watch BP) Restrictions Weight Bearing Restrictions Per Provider Order: Yes RLE Weight Bearing Per Provider Order: Weight bearing as tolerated LLE Weight Bearing Per Provider Order: Weight bearing as tolerated  Pain: 3/10 pain reported in BLEs, pain meds provided as needed, rest breaks offered often.     Therapy/Group: Individual Therapy  Mollie Anger [facility] 11/05/2023, 12:07 PM

## 2023-11-05 NOTE — Progress Notes (Signed)
 PROGRESS NOTE   Subjective/Complaints:  Reports continued R hip/pelvis pain overall controlled with current medication. She just got tramadol  few minutes before I spoke with her this AM. No additional complaints this morning.  ROS: Limited due to cognition.. Denies CP, SOB, abd pain, N/V/D/C, or any other complaints at this time.   + R hip/pelvis area pain -- improved + Incontinence-- has foley now   Objective:   No results found.    Recent Labs    11/03/23 0559 11/05/23 0548  WBC 4.8 5.0  HGB 9.8* 10.6*  HCT 30.3* 32.8*  PLT 545* 514*    Recent Labs    11/03/23 0559 11/05/23 0548  NA 134* 133*  K 4.0 4.4  CL 99 100  CO2 26 27  GLUCOSE 114* 109*  BUN 11 12  CREATININE 0.66 0.61  CALCIUM  8.7* 8.6*      Intake/Output Summary (Last 24 hours) at 11/05/2023 1645 Last data filed at 11/05/2023 1317 Gross per 24 hour  Intake 1251 ml  Output 2300 ml  Net -1049 ml        Physical Exam: Vital Signs Blood pressure (!) 150/65, pulse 87, temperature 98.7 F (37.1 C), temperature source Oral, resp. rate 18, height 5\' 8"  (1.727 m), weight 79.4 kg, SpO2 100%.    General: No apparent distress, sitting up in bed. HEENT: Head is normocephalic, atraumatic, sclera anicteric, oral mucosa pink and moist Neck: Supple without JVD or lymphadenopathy Heart: Reg rate and rhythm. No murmurs rubs or gallops Chest: CTA bilaterally without wheezes, rales, or rhonchi; no distress Abdomen: Soft, non-tender, non-distended, bowel sounds positive.  Psych: Pt's affect is flat. Pt is cooperative Skin: Clean and intact without signs of breakdown  Musculoskeletal:      R greater trochanter and thigh TTP, no apparent edema or deformity.        Awake, alert, and oriented x 4. Moderate memory and cognitive deficits, poor insight into current condition.--Ongoing.  No apparent delusions or hallucinations.  Cranial nerve 2-12 grossly  intact Strength 4 out of 5 throughout-except 3+ out of 5 right hip flexor due to pain No obvious sensory deficits      Assessment/Plan: 1. Functional deficits which require 3+ hours per day of interdisciplinary therapy in a comprehensive inpatient rehab setting. Physiatrist is providing close team supervision and 24 hour management of active medical problems listed below. Physiatrist and rehab team continue to assess barriers to discharge/monitor patient progress toward functional and medical goals  Care Tool:  Bathing    Body parts bathed by patient: Right arm, Left arm, Chest, Abdomen, Front perineal area, Buttocks, Right upper leg, Left upper leg, Right lower leg, Left lower leg, Face   Body parts bathed by helper: Front perineal area, Buttocks     Bathing assist Assist Level: Minimal Assistance - Patient > 75% (MIN for standing balance in shower)     Upper Body Dressing/Undressing Upper body dressing   What is the patient wearing?: Pull over shirt, Bra    Upper body assist Assist Level: Supervision/Verbal cueing    Lower Body Dressing/Undressing Lower body dressing      What is the patient wearing?: Underwear/pull up, Pants  Lower body assist Assist for lower body dressing: Moderate Assistance - Patient 50 - 74%     Toileting Toileting    Toileting assist Assist for toileting: Contact Guard/Touching assist     Transfers Chair/bed transfer  Transfers assist     Chair/bed transfer assist level: Contact Guard/Touching assist     Locomotion Ambulation   Ambulation assist   Ambulation activity did not occur: Safety/medical concerns  Assist level: Contact Guard/Touching assist Assistive device: Walker-rolling Max distance: 137'   Walk 10 feet activity   Assist  Walk 10 feet activity did not occur: Safety/medical concerns  Assist level: Contact Guard/Touching assist Assistive device: Walker-rolling   Walk 50 feet activity   Assist Walk 50  feet with 2 turns activity did not occur: Safety/medical concerns (pain)  Assist level: Contact Guard/Touching assist Assistive device: Walker-rolling    Walk 150 feet activity   Assist Walk 150 feet activity did not occur: Safety/medical concerns  Assist level: Minimal Assistance - Patient > 75% Assistive device: Walker-rolling    Walk 10 feet on uneven surface  activity   Assist Walk 10 feet on uneven surfaces activity did not occur: Safety/medical concerns         Wheelchair     Assist Is the patient using a wheelchair?: Yes Type of Wheelchair: Manual    Wheelchair assist level: Dependent - Patient 0%      Wheelchair 50 feet with 2 turns activity    Assist        Assist Level: Dependent - Patient 0%   Wheelchair 150 feet activity     Assist      Assist Level: Dependent - Patient 0%   Blood pressure (!) 150/65, pulse 87, temperature 98.7 F (37.1 C), temperature source Oral, resp. rate 18, height 5\' 8"  (1.727 m), weight 79.4 kg, SpO2 100%.  Medical Problem List and Plan: 1. Functional deficits secondary to left sacral ala and superior pubic rami fracture.  Weightbearing as tolerated per Dr. Guyann Leitz             -patient may shower -ELOS/Goals: 10-14 days, supervision goals with PT, and sup/min assist with OT and SLP-dispo to SNF   4/29: Setup-SPV UB, Md-Max A LBD. Needing a LOT of encouragement for standing tasks and transfers - cog is limiting. Husband not safe to assist - unstable with practicing with PT/OT. Son not participating in self-care activitied but can assist some. Plan for dispo to SNF   5-2: Discussed new urinary retention with Ortho PA; repeat pelvic x-rays today to ensure fracture stability, but not uncommon around expected as a result of this injury.  No need for CT. SW working on SNF options  2.  Antithrombotics: -DVT/anticoagulation:  Pharmaceutical: Lovenox  30mg  BID.  Check vascular study-negative              -antiplatelet  therapy: Aspirin  81 mg daily  3. Pain Management: Lidoderm  patch, oxycodone  as needed  -4/24 Check xray pelvis/Hip right-- stable  -Muscle rub -10/27/23 xray stable; PT noting that she doesn't ask for PRNs, and mostly has pain during sessions; will schedule 500mg  tylenol  TID and see if that helps for sessions.  4-28: Ongoing 8 out of 10 pain overnight, with significant hypertension associated.  Increase Tylenol  to 1000 mg 3 times daily, schedule oxycodone  5 mg 3 times daily, reduce as needed oxycodone  to 2.5 to 5 mg every 6 hours as needed. 4-29: Does feel pain is improved, patient with poor insight about quality of pain and  changes.  Continue current regimen, hesitant to further increase due to cognitive deficits. 4/30: DC standing oxycodone  due to concerns for contribution to urinary retention.  Add Voltaren  gel 4 times daily to hips and Robaxin  as needed. 5-1: DC oxycodone  for ongoing urinary retention and constipation, changed to tramadol  50 to 100 mg every 6 hours as needed for pain. 5-2: Pain better controlled.  Continue with tramadol  as needed, may reduce to Tylenol  if pain remains good over the weekend.  Cannot take NSAIDs due to aspirin  allergy. 5-5 Continue current regimen with tramadol  PRN  4. Mood/Behavior/Sleep: Namenda  10 mg twice daily, Cymbalta  60 mg twice daily             -antipsychotic agents: N/A - 5-2: Family reporting frequent phone calls with patient confusion, waxing and waning, consistent with delirium.  Described risk factors and treatment, placed delirium precautions.  5. Neuropsych/cognition: This patient is not quite capable of making decisions on her own behalf. 6. Skin/Wound Care: Routine skin checks 7. Fluids/Electrolytes/Nutrition: Routine in and outs with follow-up chemistries, cont vitamins/supplements  - stable 8.  Acute on chronic anemia/history of GI bleed/colon cancer.  Patient receives outpatient iron  infusions.  Continue iron  supplement.  Follow-up per  hematology services Dr.Gorsuch  -4/22 HGB stable 8.3  - Stable at 8-9  9.  Epilepsy.  Tegretol  600 mg twice daily as well as zarontin  500 mg twice daily. 10.  CAD/CABG/history of stents/PPM.  Continue low-dose aspirin .  Follow-up cardiology service Dr. [place].  Continue Imdur  15 mg daily. Denies CP 11.  Hypothyroidism.  Synthroid  50mcg daily 12.  Hyponatremia.  Sodium chloride  tablets 1 g 3 times daily.  Follow-up chemistries tomorrow  -4/24 Up to 135  4-28: 132 today, seems about baseline.  Monitor in 2 to 3 days.  5-1: 135.  Reduce salt tabs to 1 g twice daily.  Repeat Monday.  -5/5  NA overall stable at 133, continue current regimen  13.Hypertension. Norvasc  5 mg daily. Monitor with increased mobility             -bp borderline at present.  4/22 increase norvasc  to 7.5  4/24-25 fair control, continue current regimen and monitor  -4/26-27/25 BPs variable but fair, monitor.  4-28: Significant hypertension overnight, likely associated with pain.  Monitor today with increased regimen as above.  Increase Norvasc  to 10 mg for tomorrow a.m.; benefit from rescheduling to nightly 4/29: Blood pressure improved today. Monitor with increase 2-3 days--improving 4/30: Staying in 140s to 150s over 60s, monitor 1 more day and may add ARB if no improvement (has had cough with ACE in the past).  Per pharmacy, Norvasc  may also contribute to urinary retention, so may have to adjust this 5-1: Some intermittent hypertension, but now normotensive mostly.  Continue current regimen. -11/03/23 BPs variable, but close to normotension mostly; monitor -5/5 continue to monitor for now, if BP remains elevated consider additional medication tomorrow   Vitals:   11/02/23 0802 11/02/23 1346 11/02/23 1939 11/03/23 0415  BP: 123/63 (!) 156/68 (!) 152/68 131/64   11/03/23 1253 11/03/23 1956 11/04/23 0406 11/04/23 0900  BP: (!) 141/71 138/69 119/71 137/62   11/04/23 1439 11/04/23 2004 11/05/23 0536 11/05/23 1355  BP: (!)  154/63 (!) 152/69 (!) 166/72 (!) 150/65     14. Fibromyalgia   -Hx of fibromyalgia noted.   -Will start gabapentin  100mg  BID  -Increase gabapentin  to 100mg  TID--improved  15. Urine incontinence/UTI - Discussed with husband reports that she did not have frequent incontinence until  recent hospitalization  -4/24 Check UA and culture -10/27/23 U/A c/w UTI, started on keflex  250mg  QID on 10/25/23 PM, EOT 10/30/23 PM-- UCx pending, will follow -10/28/23 UCx with >100k CFU E.coli, 40k CFU proteus mirabilus, susceptibilities pending; cont keflex  for now, await susceptibilities  4-28: Both species pansensitive, continue Keflex  4-29.  Multiple, large-volume straight caths overnight, 2x >900.  Started on Flomax  0.4 mg daily.  Every 6 hour PVR/bladder scans. 4/30: Keflex  finished. Retention ongoing. Repeat UA negative, increase flomax  to 0.8 mg at bedtime.  See #17 below--constipation and oxycodone  likely contributors 5-1: Ongoing need for intermittent straight caths, pending renal ultrasound today given repeated high-volume caths may need to place Foley catheter.  Family made aware.  DC oxycodone  as above, patient had large bowel movement yesterday. 5-2: Renal ultrasound normal.  Continues with high-volume caths.  Discussed with family, will place Foley today, repeat UA due to new foul odor, arrange outpatient urology follow-up.  Per Ortho, not uncommon with pelvic ring fractures. -11/03/23 UCx from yesterday with E.coli >100k CFU. Previously on Keflex , consulted pharmacy, could do cefodroxil 500mg  BID x5 days (10 doses starting today) -11/04/23 UCx Ecoli showing intermediate resistance to ampicillin but sensitive to all others; continue cefodroxil as prescribed; MAY need to consider exchange of foley after treatment? -5/5 Continue Cefadroxil, WBC 5.0 today-not elevated   16  Hypercholesterolemia -Husband report he brought it [place] from home, last dose 4/13. Called pharmacy to discuss  7. Bowel function -  4/24 add Senokot S 2 tabs QHS, continue MiraLAX  twice daily, colace 100mg  BID  4/30: Increase sennakot s tro 2 tabs BID fpr small Bms; KUB today for constipation contributing to urinary retention --large stool burden seen in proximal and transverse colon, ordered nursing to give sorbitol  and follow-up with soapsuds enema if no result. 5-1: Large bowel movement with sorbitol  yesterday; patient refused enema. -5/5 LBM today, continue to follow    LOS: 14 days A FACE TO FACE EVALUATION WAS PERFORMED  Lylia Sand 11/05/2023, 4:45 PM

## 2023-11-05 NOTE — Plan of Care (Signed)
  Problem: Consults Goal: RH GENERAL PATIENT EDUCATION Description: See Patient Education module for education specifics. Outcome: Progressing   Problem: RH BOWEL ELIMINATION Goal: RH STG MANAGE BOWEL WITH ASSISTANCE Description: STG Manage Bowel with supervision Assistance. Outcome: Progressing   Problem: RH SKIN INTEGRITY Goal: RH STG SKIN FREE OF INFECTION/BREAKDOWN Description: Manage skin free of infection/breakdown with supervision assistance Outcome: Progressing

## 2023-11-06 DIAGNOSIS — E441 Mild protein-calorie malnutrition: Secondary | ICD-10-CM | POA: Diagnosis not present

## 2023-11-06 DIAGNOSIS — F0390 Unspecified dementia without behavioral disturbance: Secondary | ICD-10-CM | POA: Diagnosis not present

## 2023-11-06 DIAGNOSIS — I10 Essential (primary) hypertension: Secondary | ICD-10-CM | POA: Diagnosis not present

## 2023-11-06 DIAGNOSIS — S3289XD Fracture of other parts of pelvis, subsequent encounter for fracture with routine healing: Secondary | ICD-10-CM | POA: Diagnosis not present

## 2023-11-06 DIAGNOSIS — S3289XA Fracture of other parts of pelvis, initial encounter for closed fracture: Secondary | ICD-10-CM | POA: Diagnosis not present

## 2023-11-06 DIAGNOSIS — K219 Gastro-esophageal reflux disease without esophagitis: Secondary | ICD-10-CM | POA: Diagnosis not present

## 2023-11-06 DIAGNOSIS — R1312 Dysphagia, oropharyngeal phase: Secondary | ICD-10-CM | POA: Diagnosis not present

## 2023-11-06 DIAGNOSIS — Z951 Presence of aortocoronary bypass graft: Secondary | ICD-10-CM | POA: Diagnosis not present

## 2023-11-06 DIAGNOSIS — E538 Deficiency of other specified B group vitamins: Secondary | ICD-10-CM | POA: Diagnosis not present

## 2023-11-06 DIAGNOSIS — R2681 Unsteadiness on feet: Secondary | ICD-10-CM | POA: Diagnosis not present

## 2023-11-06 DIAGNOSIS — Z7401 Bed confinement status: Secondary | ICD-10-CM | POA: Diagnosis not present

## 2023-11-06 DIAGNOSIS — M797 Fibromyalgia: Secondary | ICD-10-CM | POA: Diagnosis not present

## 2023-11-06 DIAGNOSIS — Z85038 Personal history of other malignant neoplasm of large intestine: Secondary | ICD-10-CM | POA: Diagnosis not present

## 2023-11-06 DIAGNOSIS — D509 Iron deficiency anemia, unspecified: Secondary | ICD-10-CM | POA: Diagnosis not present

## 2023-11-06 DIAGNOSIS — E785 Hyperlipidemia, unspecified: Secondary | ICD-10-CM | POA: Diagnosis not present

## 2023-11-06 DIAGNOSIS — S3282XD Multiple fractures of pelvis without disruption of pelvic ring, subsequent encounter for fracture with routine healing: Secondary | ICD-10-CM | POA: Diagnosis not present

## 2023-11-06 DIAGNOSIS — G8929 Other chronic pain: Secondary | ICD-10-CM | POA: Diagnosis not present

## 2023-11-06 DIAGNOSIS — M6281 Muscle weakness (generalized): Secondary | ICD-10-CM | POA: Diagnosis not present

## 2023-11-06 DIAGNOSIS — R339 Retention of urine, unspecified: Secondary | ICD-10-CM | POA: Diagnosis not present

## 2023-11-06 DIAGNOSIS — E871 Hypo-osmolality and hyponatremia: Secondary | ICD-10-CM | POA: Diagnosis not present

## 2023-11-06 DIAGNOSIS — E039 Hypothyroidism, unspecified: Secondary | ICD-10-CM | POA: Diagnosis not present

## 2023-11-06 DIAGNOSIS — R41841 Cognitive communication deficit: Secondary | ICD-10-CM | POA: Diagnosis not present

## 2023-11-06 DIAGNOSIS — S32512D Fracture of superior rim of left pubis, subsequent encounter for fracture with routine healing: Secondary | ICD-10-CM | POA: Diagnosis not present

## 2023-11-06 DIAGNOSIS — S3219XD Other fracture of sacrum, subsequent encounter for fracture with routine healing: Secondary | ICD-10-CM | POA: Diagnosis not present

## 2023-11-06 DIAGNOSIS — K5900 Constipation, unspecified: Secondary | ICD-10-CM | POA: Diagnosis not present

## 2023-11-06 DIAGNOSIS — F32A Depression, unspecified: Secondary | ICD-10-CM | POA: Diagnosis not present

## 2023-11-06 DIAGNOSIS — R569 Unspecified convulsions: Secondary | ICD-10-CM | POA: Diagnosis not present

## 2023-11-06 DIAGNOSIS — Z950 Presence of cardiac pacemaker: Secondary | ICD-10-CM | POA: Diagnosis not present

## 2023-11-06 DIAGNOSIS — M545 Low back pain, unspecified: Secondary | ICD-10-CM | POA: Diagnosis not present

## 2023-11-06 DIAGNOSIS — I2510 Atherosclerotic heart disease of native coronary artery without angina pectoris: Secondary | ICD-10-CM | POA: Diagnosis not present

## 2023-11-06 DIAGNOSIS — E559 Vitamin D deficiency, unspecified: Secondary | ICD-10-CM | POA: Diagnosis not present

## 2023-11-06 DIAGNOSIS — R2689 Other abnormalities of gait and mobility: Secondary | ICD-10-CM | POA: Diagnosis not present

## 2023-11-06 DIAGNOSIS — I495 Sick sinus syndrome: Secondary | ICD-10-CM | POA: Diagnosis not present

## 2023-11-06 DIAGNOSIS — S32810D Multiple fractures of pelvis with stable disruption of pelvic ring, subsequent encounter for fracture with routine healing: Secondary | ICD-10-CM | POA: Diagnosis not present

## 2023-11-06 DIAGNOSIS — N390 Urinary tract infection, site not specified: Secondary | ICD-10-CM | POA: Diagnosis not present

## 2023-11-06 MED ORDER — VITAMIN D3 25 MCG PO TABS
4000.0000 [IU] | ORAL_TABLET | Freq: Every day | ORAL | Status: AC
Start: 2023-11-07 — End: ?

## 2023-11-06 MED ORDER — CEFADROXIL 500 MG PO CAPS: 500.0000 mg | ORAL_CAPSULE | Freq: Two times a day (BID) | ORAL | Status: DC

## 2023-11-06 MED ORDER — FLUTICASONE PROPIONATE 50 MCG/ACT NA SUSP: 1.0000 | Freq: Every day | NASAL | Status: AC

## 2023-11-06 MED ORDER — DOCUSATE SODIUM 100 MG PO CAPS: 100.0000 mg | ORAL_CAPSULE | Freq: Two times a day (BID) | ORAL | Status: AC

## 2023-11-06 MED ORDER — TRAMADOL HCL 50 MG PO TABS
50.0000 mg | ORAL_TABLET | Freq: Four times a day (QID) | ORAL | 0 refills | Status: AC | PRN
Start: 2023-11-06 — End: ?

## 2023-11-06 MED ORDER — LIDOCAINE 5 % EX PTCH: 2.0000 | MEDICATED_PATCH | CUTANEOUS | 0 refills | Status: AC

## 2023-11-06 MED ORDER — DICLOFENAC SODIUM 1 % EX GEL: 4.0000 g | Freq: Four times a day (QID) | CUTANEOUS | Status: AC

## 2023-11-06 MED ORDER — TAMSULOSIN HCL 0.4 MG PO CAPS: 0.8000 mg | ORAL_CAPSULE | Freq: Every day | ORAL | Status: DC

## 2023-11-06 MED ORDER — METHOCARBAMOL 500 MG PO TABS: 500.0000 mg | ORAL_TABLET | Freq: Three times a day (TID) | ORAL | Status: AC | PRN

## 2023-11-06 MED ORDER — GABAPENTIN 100 MG PO CAPS: 100.0000 mg | ORAL_CAPSULE | Freq: Three times a day (TID) | ORAL | Status: AC

## 2023-11-06 MED ORDER — AMLODIPINE BESYLATE 10 MG PO TABS: 10.0000 mg | ORAL_TABLET | Freq: Every day | ORAL | Status: DC

## 2023-11-06 MED ORDER — ISOSORBIDE MONONITRATE ER 30 MG PO TB24: 15.0000 mg | ORAL_TABLET | Freq: Every day | ORAL | Status: DC

## 2023-11-06 MED ORDER — ASPIRIN 81 MG PO TBEC: 81.0000 mg | DELAYED_RELEASE_TABLET | Freq: Every day | ORAL | Status: AC

## 2023-11-06 MED ORDER — POLYETHYLENE GLYCOL 3350 17 G PO PACK: 17.0000 g | PACK | Freq: Every day | ORAL | Status: AC | PRN

## 2023-11-06 MED ORDER — DULOXETINE HCL 60 MG PO CPEP
60.0000 mg | ORAL_CAPSULE | Freq: Two times a day (BID) | ORAL | Status: AC
Start: 2023-11-06 — End: ?

## 2023-11-06 MED ORDER — SODIUM CHLORIDE 1 G PO TABS
1.0000 g | ORAL_TABLET | Freq: Two times a day (BID) | ORAL | Status: AC
Start: 2023-11-06 — End: ?

## 2023-11-06 NOTE — Progress Notes (Signed)
 Physical Therapy Session Note  Patient Details  Name: Rachel Clark MRN: [id number] Date of Birth: August 14, 1950  Today's Date: 11/06/2023 PT Individual Time: 0920-1030 PT Individual Time Calculation (min): 70 min   Short Term Goals: Week 2:  PT Short Term Goal 1 (Week 2): STGs = LTGs  Skilled Therapeutic Interventions/Progress Updates:    Pt presents in room seated in recliner, asleep, awakens slowly but agreeable to PT. Pt denies pain at rest but demonstrates pain with mobility. Session focused on therapeutic activities for activity tolerance, upright tolerance and dynamic standing balance needed for ambulation, transfers and to decrease fall risk. Pt demonstrating buckling/catch with ambulation this session, requiring CGA/min assist for gait. Pt able to complete all transfers with CGA to RW. Pt ambulates 105' from room to day room with RW to sit on mat, cues for technique, ambulates within day room with min assist and ambulates back to room at end of session with RW CGA/min assist 135'. Pt completes standing tolerance and dynamic standing balance activity throwing bean bags at corn hole board, completing throwing with RUE and LUE with pt demonstrating unilateral UE support on RW during toss and staggered stance. Pt able to maintain standing for ~4 minutes total, with frequent seated rest breaks with longest standing bout ~45 seconds. Pt completes interval training on kinetron for activity tolerance 30 sec work/30 sec rest and 90 cm/sec. Pt completes standing therex, bilateral terminal knee extension x10, completes transfer training x2 sit<>stands with cues to reach back when sitting to decrease pain with sitting with pt demonstrating uncontrolled descent however able to do with max verbal cues with 2nd trial. Pt returns to room and remains seated in recliner with all needs within reach, cal light in place and chair alarm donned and activated at end of session.   Therapy Documentation Precautions:   Precautions Precautions: Fall Recall of Precautions/Restrictions: Impaired Precaution/Restrictions Comments: WBAT, Dementia, PPM (watch BP) Restrictions Weight Bearing Restrictions Per Provider Order: Yes RLE Weight Bearing Per Provider Order: Weight bearing as tolerated LLE Weight Bearing Per Provider Order: Weight bearing as tolerated   Therapy/Group: Individual Therapy  Annia Kilts PT, DPT 11/06/2023, 11:00 AM

## 2023-11-06 NOTE — Plan of Care (Signed)
  Problem: RH Balance Goal: LTG Patient will maintain dynamic standing with ADLs (OT) Description: LTG:  Patient will maintain dynamic standing balance with assist during activities of daily living (OT)  Outcome: Completed/Met   Problem: Sit to Stand Goal: LTG:  Patient will perform sit to stand in prep for activites of daily living with assistance level (OT) Description: LTG:  Patient will perform sit to stand in prep for activites of daily living with assistance level (OT) Outcome: Completed/Met   Problem: RH Bathing Goal: LTG Patient will bathe all body parts with assist levels (OT) Description: LTG: Patient will bathe all body parts with assist levels (OT) Outcome: Completed/Met   Problem: RH Dressing Goal: LTG Patient will perform lower body dressing w/assist (OT) Description: LTG: Patient will perform lower body dressing with assist, with/without cues in positioning using equipment (OT) Outcome: Completed/Met   Problem: RH Toileting Goal: LTG Patient will perform toileting task (3/3 steps) with assistance level (OT) Description: LTG: Patient will perform toileting task (3/3 steps) with assistance level (OT)  Outcome: Completed/Met   Problem: RH Toilet Transfers Goal: LTG Patient will perform toilet transfers w/assist (OT) Description: LTG: Patient will perform toilet transfers with assist, with/without cues using equipment (OT) Outcome: Completed/Met   Problem: RH Tub/Shower Transfers Goal: LTG Patient will perform tub/shower transfers w/assist (OT) Description: LTG: Patient will perform tub/shower transfers with assist, with/without cues using equipment (OT) Outcome: Adequate for Discharge   Problem: RH Memory Goal: LTG Patient will demonstrate ability for day to day recall/carry over during activities of daily living with assistance level (OT) Description: LTG:  Patient will demonstrate ability for day to day recall/carry over during activities of daily living with  assistance level (OT). Outcome: Adequate for Discharge

## 2023-11-06 NOTE — Progress Notes (Signed)
 Occupational Therapy Discharge Summary  Patient Details  Name: Rachel Clark MRN: [id number] Date of Birth: [birth year]  Date of Discharge from OT service:Nov 06, 2023   Patient has met 6 of 6 long term goals due to improved activity tolerance, improved balance, ability to compensate for deficits, and functional use of  RIGHT lower and LEFT lower extremity. Requiring increased levels of encouragement to participate in sessions and strive for full functional potential. Patient to discharge at [facility] Assist level. Patient transitioning to SNF for continued rehabilitation to address deficits impacting ADL participation.   Reasons goals not met: N/A  Recommendation:  Patient will benefit from ongoing skilled OT services in skilled nursing facility setting to continue to advance functional skills in the area of BADL and Reduce care partner burden.  Equipment: No equipment provided  Reasons for discharge:  Transition to SNF  Patient/family agrees with progress made and goals achieved: Yes  OT Discharge Precautions/Restrictions  Precautions Precautions: Fall Recall of Precautions/Restrictions: Impaired Precaution/Restrictions Comments: WBAT, Dementia, PPM (watch BP) Restrictions Weight Bearing Restrictions Per Provider Order: Yes RLE Weight Bearing Per Provider Order: Weight bearing as tolerated LLE Weight Bearing Per Provider Order: Weight bearing as tolerated ADL ADL Eating: Supervision/safety, Set up Where Assessed-Eating: Chair Grooming: Supervision/safety, Setup Where Assessed-Grooming: Sitting at sink Upper Body Bathing: Supervision/safety, Setup Where Assessed-Upper Body Bathing: Sitting at sink Lower Body Bathing: Minimal assistance Where Assessed-Lower Body Bathing: Sitting at sink, Standing at sink Upper Body Dressing: Supervision/safety, Setup Where Assessed-Upper Body Dressing: Sitting at sink Lower Body Dressing: Minimal assistance Where Assessed-Lower Body  Dressing: Sitting at sink, Standing at sink Toileting: Minimal assistance Where Assessed-Toileting: Bedside Commode, Toilet Toilet Transfer: Furniture conservator/restorer Method: Proofreader: Bedside commode, Grab bars Tub/Shower Transfer: Unable to assess Tub/Shower Transfer Method: Unable to assess Film/video editor: Contact guard, Minimal assistance Film/video editor Method: Designer, industrial/product: Emergency planning/management officer, Grab bars Vision Baseline Vision/History: 1 Wears glasses Patient Visual Report: No change from baseline Vision Assessment?: Yes;No apparent visual deficits Perception  Perception: Within Functional Limits Praxis Praxis: WFL Cognition Cognition Overall Cognitive Status: History of cognitive impairments - at baseline Arousal/Alertness: Awake/alert Memory: Impaired Memory Impairment: Retrieval deficit;Storage deficit;Decreased recall of new information Awareness: Impaired Awareness Impairment: Intellectual impairment Problem Solving: Impaired Problem Solving Impairment: Verbal basic;Functional basic;Functional complex Behaviors: Poor frustration tolerance Safety/Judgment: Impaired Brief Interview for Mental Status (BIMS) Repetition of Three Words (First Attempt): 2 Temporal Orientation: Year: Missed by 2 to 5 years Temporal Orientation: Month: Missed by 6 days to 1 month Temporal Orientation: Day: No answer Recall: "Sock": No, could not recall Recall: "Blue": No, could not recall Recall: "Bed": No, could not recall BIMS Summary Score: 4 Sensation Sensation Light Touch: Appears Intact Hot/Cold: Appears Intact Proprioception: Not tested Stereognosis: Not tested Coordination Gross Motor Movements are Fluid and Coordinated: No Fine Motor Movements are Fluid and Coordinated: No Coordination and Movement Description: Decreased smoothness and accuracy secondary to generalized weakness. Motor  Motor Motor:  Abnormal postural alignment and control Motor - Discharge Observations: Decreased smoothness and accuracy secondary to generalized weakness, improved from eval. Mobility  Bed Mobility Bed Mobility: Sit to Supine;Supine to Sit Supine to Sit: Supervision/Verbal cueing Sit to Supine: Supervision/Verbal cueing Transfers Sit to Stand: Contact Guard/Touching assist Stand to Sit: Contact Guard/Touching assist  Trunk/Postural Assessment  Cervical Assessment Cervical Assessment: Within Functional Limits Thoracic Assessment Thoracic Assessment: Exceptions to [facility] (rounded shoulders) Lumbar Assessment Lumbar Assessment: Exceptions to [facility] (posterior pelvic tilt) Postural  Control Postural Control: Deficits on evaluation Righting Reactions: delayed and inadequate Protective Responses: decreased Postural Limitations: decreased  Balance Balance Balance Assessed: Yes Static Sitting Balance Static Sitting - Balance Support: Feet supported Static Sitting - Level of Assistance: 5: Stand by assistance Dynamic Sitting Balance Dynamic Sitting - Balance Support: Feet supported Dynamic Sitting - Level of Assistance: 5: Stand by assistance Static Standing Balance Static Standing - Balance Support: During functional activity;Bilateral upper extremity supported Static Standing - Level of Assistance: 5: Stand by assistance (CGA) Dynamic Standing Balance Dynamic Standing - Balance Support: During functional activity;Bilateral upper extremity supported Dynamic Standing - Level of Assistance: 5: Stand by assistance (CGA) Extremity/Trunk Assessment RUE Assessment RUE Assessment: Exceptions to [facility] Active Range of Motion (AROM) Comments: WFL General Strength Comments: 3+/5 LUE Assessment LUE Assessment: Exceptions to [facility] Active Range of Motion (AROM) Comments: [facility] General Strength Comments: 3+/5   Artemus Biles, OTR/L, MSOT  11/06/2023, 1:49 PM

## 2023-11-06 NOTE — Progress Notes (Signed)
 1Patient ID: Rachel Clark, female   DOB: [birth year], 73 y.o.   MRN: [id number]  SW spoke with Nikki/Admissions with [facility] to discuss bed offer. Reports bed offer today and unsure if there will be a bed tomorrow. SW will follow-up.  SW discussed with medical team. Medical team in agreement with discharge.   [facility] ambulance will pick up at 3:30pm.   SW spoke with pt, pt husband, and pt son to discuss above.   Rm# [phone] Nurse Report# [phone]  *D/c packet left at front desk.   Norval Been, MSW, LCSW Office: [phone] Cell: [phone] Fax: [phone]

## 2023-11-06 NOTE — Plan of Care (Signed)
  Problem: RH Ambulation Goal: LTG Patient will ambulate in controlled environment (PT) Description: LTG: Patient will ambulate in a controlled environment, # of feet with assistance (PT). 11/06/2023 1234 by Annia Kilts, PT Outcome: Not Met (add Reason) Note: Unable to complete due to DC to SNF 11/06/2023 1234 by Annia Kilts, PT Reactivated 11/06/2023 0800 by Annia Kilts, PT Outcome: Not Applicable Flowsheets (Taken 11/06/2023 0800) LTG: Pt will ambulate in controlled environ  assist needed:: Contact Guard/Touching assist Goal: LTG Patient will ambulate in home environment (PT) Description: LTG: Patient will ambulate in home environment, # of feet with assistance (PT). 11/06/2023 1234 by Annia Kilts, PT Outcome: Not Met (add Reason) Note: Inconsistent performance ranging from CGA to min assist 11/06/2023 1234 by Annia Kilts, PT Reactivated 11/06/2023 0800 by Annia Kilts, PT Outcome: Not Applicable Flowsheets (Taken 11/06/2023 0800) LTG: Pt will ambulate in home environ  assist needed:: Contact Guard/Touching assist   Problem: RH Balance Goal: LTG Patient will maintain dynamic standing balance (PT) Description: LTG:  Patient will maintain dynamic standing balance with assistance during mobility activities (PT) 11/06/2023 1234 by Annia Kilts, PT Outcome: Completed/Met 11/06/2023 1234 by Annia Kilts, PT Reactivated 11/06/2023 0800 by Annia Kilts, PT Outcome: Not Applicable Flowsheets (Taken 11/06/2023 0800) LTG: Pt will maintain dynamic standing balance during mobility activities with:: Contact Guard/Touching assist   Problem: Sit to Stand Goal: LTG:  Patient will perform sit to stand with assistance level (PT) Description: LTG:  Patient will perform sit to stand with assistance level (PT) 11/06/2023 1234 by Annia Kilts, PT Outcome: Completed/Met 11/06/2023 1234 by Annia Kilts, PT Reactivated 11/06/2023 0800 by Annia Kilts, PT Outcome: Not  Applicable Flowsheets (Taken 11/06/2023 0800) LTG: PT will perform sit to stand in preparation for functional mobility with assistance level: Contact Guard/Touching assist   Problem: RH Bed Mobility Goal: LTG Patient will perform bed mobility with assist (PT) Description: LTG: Patient will perform bed mobility with assistance, with/without cues (PT). 11/06/2023 1234 by Annia Kilts, PT Outcome: Completed/Met 11/06/2023 1234 by Annia Kilts, PT Reactivated 11/06/2023 0800 by Annia Kilts, PT Outcome: Not Applicable   Problem: RH Bed to Chair Transfers Goal: LTG Patient will perform bed/chair transfers w/assist (PT) Description: LTG: Patient will perform bed to chair transfers with assistance (PT). 11/06/2023 1234 by Annia Kilts, PT Outcome: Completed/Met 11/06/2023 1234 by Annia Kilts, PT Reactivated 11/06/2023 0800 by Annia Kilts, PT Outcome: Not Applicable Flowsheets (Taken 11/06/2023 0800) LTG: Pt will perform Bed to Chair Transfers with assistance level: Contact Guard/Touching assist   Problem: RH Car Transfers Goal: LTG Patient will perform car transfers with assist (PT) Description: LTG: Patient will perform car transfers with assistance (PT). 11/06/2023 1234 by Annia Kilts, PT Outcome: Completed/Met 11/06/2023 1234 by Annia Kilts, PT Reactivated 11/06/2023 0800 by Annia Kilts, PT Outcome: Not Applicable Flowsheets (Taken 11/06/2023 0800) LTG: Pt will perform car transfers with assist:: Contact Guard/Touching assist

## 2023-11-06 NOTE — Plan of Care (Signed)
  Problem: RH BOWEL ELIMINATION Goal: RH STG MANAGE BOWEL WITH ASSISTANCE Description: STG Manage Bowel with supervision Assistance. Outcome: Progressing   Problem: RH BLADDER ELIMINATION Goal: RH STG MANAGE BLADDER WITH ASSISTANCE Description: STG Manage Bladder With supervision  Assistance Outcome: Progressing   Problem: RH SKIN INTEGRITY Goal: RH STG SKIN FREE OF INFECTION/BREAKDOWN Description: Manage skin free of infection/breakdown with supervision assistance Outcome: Progressing   Problem: RH SAFETY Goal: RH STG ADHERE TO SAFETY PRECAUTIONS W/ASSISTANCE/DEVICE Description: STG Adhere to Safety Precautions With  supervision Assistance/Device. Outcome: Progressing   Problem: RH PAIN MANAGEMENT Goal: RH STG PAIN MANAGED AT OR BELOW PT'S PAIN GOAL Description: <4 w/ prns Outcome: Progressing   Problem: RH KNOWLEDGE DEFICIT GENERAL Goal: RH STG INCREASE KNOWLEDGE OF SELF CARE AFTER HOSPITALIZATION Description: Manage increase knowledge of self care after hospitalization with supervision from family using educational materials provided Outcome: Progressing

## 2023-11-06 NOTE — Progress Notes (Signed)
 Inpatient Rehabilitation Discharge Medication Review by a Pharmacist  A complete drug regimen review was completed for this patient to identify any potential clinically significant medication issues.  High Risk Drug Classes Is patient taking? Indication by Medication  Antipsychotic No   Anticoagulant No   Antibiotic Yes Duricef- UTI  Opioid Yes Tramadol - acute pain  Antiplatelet Yes Aspirin , Effient - cva ppx  Hypoglycemics/insulin No   Vasoactive Medication Yes Norvasc , Imdur - HTN NitroSL- angina Flomax - BPH  Chemotherapy No   Other Yes Nexlizet , Repatha - HLD Tegretol , ethosuximide - seizure ppx Cymbalta , gabapentin - neuropathic pain Synthroid - hypothyroidism Namenda - dementia / alzheimer's Robaxin - muscle spasms Protonix - GERD     Type of Medication Issue Identified Description of Issue Recommendation(s)  Drug Interaction(s) (clinically significant)     Duplicate Therapy     Allergy     No Medication Administration End Date     Incorrect Dose     Additional Drug Therapy Needed     Significant med changes from prior encounter (inform family/care partners about these prior to discharge).    Other       Clinically significant medication issues were identified that warrant physician communication and completion of prescribed/recommended actions by midnight of the next day:  No   Time spent performing this drug regimen review (minutes):  30   Adelfo Diebel BS, PharmD, BCPS Clinical Pharmacist 11/06/2023 11:29 AM  Contact: [phone] after 3 PM  "Be curious, not judgmental..." -Rumalda Counter

## 2023-11-06 NOTE — Progress Notes (Signed)
 Occupational Therapy Session Note  Patient Details  Name: Rachel Clark MRN: [id number] Date of Birth: 1950/10/08  Today's Date: 11/06/2023 OT Individual Time: 8657-8469 OT Individual Time Calculation (min): 42 min   Today's Date: 11/06/2023 OT Individual Time: 1302-1410 OT Individual Time Calculation (min): 68 min   Short Term Goals: Week 2:  OT Short Term Goal 1 (Week 2): Pt will perform toilet transfer with consistent Min A + LRAD. OT Short Term Goal 2 (Week 2): Pt will dress LB with Mod A + LRAD. OT Short Term Goal 3 (Week 2): Pt will bathe LB with Min A + LRAD.  Skilled Therapeutic Interventions/Progress Updates:   Session 1: Pt received resting in bed, appearing to be in good spirits, receptive to therapy session. Reports 4/10 RLE pain, pre-mediated, rest provided as needed. Supine>sit EOB with supervision + bed features. Sit<>stands throughout session with CGA + RW, progressing to close supervision. Ambulating toilet transfer with CGA + RW, CGA-Min A provided for 3/3 toileting activities with multiple sit<>stands required for LB clothing hike due to pain/fatigue. Pt requires increased time to attempt bowel void, no success. Pt benefits from extended rest-break in recliner before ambulating from room>day room with CGA + RW, another seated rest-break, before ambulating from day room around nurses station back to room. Pt with x2-3 instances of R-knee buckling, recovering with light Min A. VSS post activity. Pt remained sitting in recliner, posey belt activated, immediate needs met, and door open.   Session 2:  Pt received sitting in recliner, family present, reports of increased pain in RLE, LPN made aware, rest provided as needed. Pt performs sit<>stands during session with CGA progressing to close supervision + RW. Ambulatory transfer from room<>day room with CGA + RW, increased cuing provided for encouragement. In day room, pt instructed in series of activities to target/challenge  activity tolerance and dynamic sitting/standing balance, details below: 3x53min reps of ball pass with use of 2# dowel bar 2x10 reps of retrieving resistive clips from cones on floor 2x10 reps of tapping cones on floor with hand Standing peg board activity attempted with unilateral support on RW, pt able to only tolerate ~2 mins of activity  Pt returns to supine with supervision, session concluded with patient resting in bed, all immediate needs met, bed alarm activated.   Therapy Documentation Precautions:  Precautions Precautions: Fall Recall of Precautions/Restrictions: Impaired Precaution/Restrictions Comments: WBAT, Dementia, PPM (watch BP) Restrictions Weight Bearing Restrictions Per Provider Order: Yes RLE Weight Bearing Per Provider Order: Weight bearing as tolerated LLE Weight Bearing Per Provider Order: Weight bearing as tolerated   Therapy/Group: Individual Therapy  Artemus Biles, OTR/L, MSOT  11/06/2023, 11:28 AM

## 2023-11-06 NOTE — Progress Notes (Signed)
 Physical Therapy Discharge Summary  Patient Details  Name: Rachel Clark MRN: [id number] Date of Birth: Feb 14, 1951  Date of Discharge from PT service: Nov 06, 2023  Today's Date: 11/06/2023 PT Individual Time: 0920-1030 PT Individual Time Calculation (min): 70 min    Patient has met 5 of 7 long term goals due to improved activity tolerance, improved balance, improved postural control, increased strength, increased range of motion, decreased pain, and ability to compensate for deficits.  Patient to discharge to SNF at level Min Assist. Education has been ongoing with pt husband and son who are unable to provide necessary physical assistance at DC.  Reasons goals not met: DC to SNF  Recommendation:  Patient will benefit from ongoing skilled PT services in skilled nursing facility setting to continue to advance safe functional mobility, address ongoing impairments in gait mechanics, activity tolerance, upright tolerance, BLE strengthening and ROM, standing balance, transfers, and minimize fall risk.  Equipment: No equipment provided  Reasons for discharge: discharge from hospital  Patient/family agrees with progress made and goals achieved: Yes  PT Discharge Precautions/Restrictions Precautions Precautions: Fall Recall of Precautions/Restrictions: Impaired Precaution/Restrictions Comments: WBAT, Dementia, PPM (watch BP) Restrictions Weight Bearing Restrictions Per Provider Order: Yes RLE Weight Bearing Per Provider Order: Weight bearing as tolerated LLE Weight Bearing Per Provider Order: Weight bearing as tolerated Pain Interference Pain Interference Pain Effect on Sleep: 1. Rarely or not at all Pain Interference with Therapy Activities: 3. Frequently Pain Interference with Day-to-Day Activities: 2. Occasionally Cognition Overall Cognitive Status: History of cognitive impairments - at baseline Arousal/Alertness: Awake/alert Orientation Level: Oriented to  person Sensation Sensation Light Touch: Appears Intact Hot/Cold: Appears Intact Proprioception: Not tested Stereognosis: Not tested Coordination Gross Motor Movements are Fluid and Coordinated: No Fine Motor Movements are Fluid and Coordinated: No Coordination and Movement Description: Decreased smoothness and accuracy secondary to generalized weakness. Motor  Motor Motor: Abnormal postural alignment and control Motor - Discharge Observations: Decreased smoothness and accuracy secondary to generalized weakness, improved from eval  Mobility Bed Mobility Bed Mobility: Sit to Supine;Supine to Sit Supine to Sit: Supervision/Verbal cueing Sit to Supine: Supervision/Verbal cueing Transfers Transfers: Sit to Stand;Stand to Sit;Stand Pivot Transfers Sit to Stand: Contact Guard/Touching assist Stand to Sit: Contact Guard/Touching assist Stand Pivot Transfers: Contact Guard/Touching assist Stand Pivot Transfer Details: Verbal cues for safe use of DME/AE;Verbal cues for technique;Verbal cues for precautions/safety;Verbal cues for gait pattern Transfer (Assistive device): Rolling walker Locomotion  Gait Ambulation: Yes Gait Assistance: Minimal Assistance - Patient > 75%;Contact Guard/Touching assist Gait Distance (Feet): 137 Feet Assistive device: Rolling walker Gait Assistance Details: Verbal cues for sequencing;Verbal cues for gait pattern;Verbal cues for safe use of DME/AE Gait Gait: Yes Gait Pattern: Impaired Gait Pattern: Step-to pattern;Trunk flexed;Decreased stride length;Antalgic;Left flexed knee in stance;Right flexed knee in stance Gait velocity: decreased Stairs / Additional Locomotion Stairs: No Wheelchair Mobility Wheelchair Mobility: No  Trunk/Postural Assessment  Cervical Assessment Cervical Assessment: Within Functional Limits Thoracic Assessment Thoracic Assessment: Exceptions to [facility] (rounded shoulders) Lumbar Assessment Lumbar Assessment: Exceptions to [facility]  (posterior pelvic tilt) Postural Control Postural Control: Deficits on evaluation Righting Reactions: delayed and inadequate Protective Responses: decreased Postural Limitations: decreased  Balance Balance Balance Assessed: Yes Static Sitting Balance Static Sitting - Balance Support: Feet supported Static Sitting - Level of Assistance: 5: Stand by assistance Dynamic Sitting Balance Dynamic Sitting - Balance Support: Feet supported Dynamic Sitting - Level of Assistance: 5: Stand by assistance Static Standing Balance Static Standing - Balance Support: During functional activity;Bilateral upper  extremity supported Static Standing - Level of Assistance: 5: Stand by assistance (CGA) Dynamic Standing Balance Dynamic Standing - Balance Support: During functional activity;Bilateral upper extremity supported Dynamic Standing - Level of Assistance: 5: Stand by assistance (CGA) Extremity Assessment  RLE Assessment RLE Assessment: Exceptions to [facility] General Strength Comments: grossly 4/5 tested in sitting LLE Assessment General Strength Comments: grossly 4/5 testing in sitting   Annia Kilts PT, DPT 11/06/2023, 12:43 PM

## 2023-11-06 NOTE — Progress Notes (Signed)
 Inpatient Rehabilitation Care Coordinator Discharge Note   Patient Details  Name: Rachel Clark MRN: [id number] Date of Birth: 02-02-1951   [facility]  Length of Stay: 14 days  Discharge activity level: Min Asst  Home/community participation: Limited  Patient response XB:JYNWGN Literacy - How often do you need to have someone help you when you read instructions, pamphlets, or other written material from your doctor or pharmacy?: Always  Patient response FA:OZHYQM Isolation - How often do you feel lonely or isolated from those around you?: Patient unable to respond  Services provided included: MD, RD, PT, OT, SLP, RN, CM, TR, Pharmacy, Neuropsych, SW  Financial Services:  Field seismologist Utilized: Medicare    Choices offered to/list presented to: patient husband  Follow-up services arranged:              Patient response to transportation need: Is the patient able to respond to transportation needs?: Yes In the past 12 months, has lack of transportation kept you from medical appointments or from getting medications?: No In the past 12 months, has lack of transportation kept you from meetings, work, or from getting things needed for daily living?: No   Patient/Family verbalized understanding of follow-up arrangements:  Yes  Individual responsible for coordination of the follow-up plan: contact pt [place] Cornea husband#[phone]  Confirmed correct DME delivered: Rennis Case 11/06/2023    Comments (or additional information):  Summary of Stay    Date/Time Discharge Planning CSW  11/06/23 1032 D/c pending SNF bed offer. SW waiting on follow-up. SW will confirm there are no barriers to discharge. AAC  10/29/23 1146 Pt will d/c to home with husband who is unable to physically lift her. SW will confim there are no barriers to discharge. AAC  10/23/23 0952 TBA. Per EMR, pt will d/c to home with husband who is unable to physically lift  her. SW will confim there are no barriers to discharge. AAC       Norris Bodley A Brendolyn Callas

## 2023-11-06 NOTE — Plan of Care (Signed)
 Patient to d/c in nursing home with foley and UTI meds

## 2023-11-06 NOTE — Plan of Care (Signed)
  Problem: RH Balance Goal: LTG Patient will maintain dynamic standing balance (PT) Description: LTG:  Patient will maintain dynamic standing balance with assistance during mobility activities (PT) Outcome: Not Applicable Flowsheets (Taken 11/06/2023 0800) LTG: Pt will maintain dynamic standing balance during mobility activities with:: Contact Guard/Touching assist   Problem: Sit to Stand Goal: LTG:  Patient will perform sit to stand with assistance level (PT) Description: LTG:  Patient will perform sit to stand with assistance level (PT) Outcome: Not Applicable Flowsheets (Taken 11/06/2023 0800) LTG: PT will perform sit to stand in preparation for functional mobility with assistance level: Contact Guard/Touching assist   Problem: RH Bed Mobility Goal: LTG Patient will perform bed mobility with assist (PT) Description: LTG: Patient will perform bed mobility with assistance, with/without cues (PT). Outcome: Not Applicable   Problem: RH Bed to Chair Transfers Goal: LTG Patient will perform bed/chair transfers w/assist (PT) Description: LTG: Patient will perform bed to chair transfers with assistance (PT). Outcome: Not Applicable Flowsheets (Taken 11/06/2023 0800) LTG: Pt will perform Bed to Chair Transfers with assistance level: Contact Guard/Touching assist   Problem: RH Car Transfers Goal: LTG Patient will perform car transfers with assist (PT) Description: LTG: Patient will perform car transfers with assistance (PT). Outcome: Not Applicable Flowsheets (Taken 11/06/2023 0800) LTG: Pt will perform car transfers with assist:: Contact Guard/Touching assist   Problem: RH Ambulation Goal: LTG Patient will ambulate in controlled environment (PT) Description: LTG: Patient will ambulate in a controlled environment, # of feet with assistance (PT). Outcome: Not Applicable Flowsheets (Taken 11/06/2023 0800) LTG: Pt will ambulate in controlled environ  assist needed:: Contact Guard/Touching  assist Goal: LTG Patient will ambulate in home environment (PT) Description: LTG: Patient will ambulate in home environment, # of feet with assistance (PT). Outcome: Not Applicable Flowsheets (Taken 11/06/2023 0800) LTG: Pt will ambulate in home environ  assist needed:: Contact Guard/Touching assist   Problem: RH Stairs Goal: LTG Patient will ambulate up and down stairs w/assist (PT) Description: LTG: Patient will ambulate up and down # of stairs with assistance (PT) Outcome: Not Applicable Note: Discontinued due to lack of progress

## 2023-11-06 NOTE — Patient Care Conference (Signed)
 Inpatient RehabilitationTeam Conference and Plan of Care Update Date: 11/06/2023   Time: 1051 am     Patient Name: Rachel Clark      Medical Record Number: [id number]  Date of Birth: Dec 21, 1950 Sex: Female         Room/Bed: 4W15C/4W15C-01 Payor Info: Payor: MEDICARE / Plan: MEDICARE PART A AND B / Product Type: *No Product type* /    Admit Date/Time:  10/22/2023  3:32 PM  Primary Diagnosis:  Pelvic fracture [facility])  Hospital Problems: Principal Problem:   Pelvic fracture [facility])    Expected Discharge Date: Expected Discharge Date: 11/06/23  Team Members Present: Physician leading conference: Dr. Abelino Able Social Worker Present: Norval Been, LCSW Nurse Present: Jerene Monks, RN PT Present: Catilin Osborn, PT OT Present: Artemus Biles, OT SLP Present: Other (comment) Buckner Carder) PPS Coordinator present : Jestine Moron, SLP     Current Status/Progress Goal Weekly Team Focus  Bowel/Bladder   Patient is incontinent of bowel. Foley catheter in place ands draining clear yellow urine.   Timed toileting   Will assess q shift and PRN    Swallow/Nutrition/ Hydration               ADL's   Setup/supervision for UB ADLs & Min-light Mod A for LB ADLs/toileting. Barriers: Increased pain, decreased activity tolerance, decreased strength, self-limiting behaviors requires increased encouragement.   Downgraded to Min A overall   Activity tolerance, standing balance/tolerance, LB care, general conditioning.    Mobility   supervision bed mobility, CGA transfers, CGA gait with RW 137'   downgraded to CGA  increased participation, transfers, gait, family ed    Communication                Safety/Cognition/ Behavioral Observations               Pain   patient denied pain   Patient will remain pain free   Will assess q shift and PRN    Skin   skin is intact   Patient will maintain skin integrity  Will assess q shift and PRN      Discharge  Planning:  D/c pending SNF bed offer. SW waiting on follow-up. SW will confirm there are no barriers to discharge.    Team Discussion: Patient was admitted post left sacral ala and superior pubic rami fracture due to fall at home. Patient has urinary tract infection, pain on right lower extremity, fluctuating blood pressures : medications adjusted by MD. Patient limited by decreased activity tolerance and dementia.   Patient on target to meet rehab goals: yes, patient was making good progress with therapy. Plan to discharge today.   *See Care Plan and progress notes for long and short-term goals.   Revisions to Treatment Plan:  N/a    Teaching Needs: Safety, transfers, toileting, medications, etc    Current Barriers to Discharge: Decreased caregiver support, Incontinence, and Behavior  Possible Resolutions to Barriers: SNF placement     Medical Summary Current Status: left sacral ala and superior pubic rami fracture, pelvis pain, hyponatremia, HTN, UTI  Barriers to Discharge: Electrolyte abnormality;Incontinence;Medical stability  Barriers to Discharge Comments: left sacral ala and superior pubic rami fracture, pelvis pain, hyponatremia, HTN, UTI Possible Resolutions to [employer] Focus: Continue ABX for UTI,Tramadol  PRN, Monitor stodium, continue salt tabs   Continued Need for Acute Rehabilitation Level of Care: The patient requires daily medical management by a physician with specialized training in physical medicine and rehabilitation for the following reasons: Direction  of a multidisciplinary physical rehabilitation program to maximize functional independence : Yes Medical management of patient stability for increased activity during participation in an intensive rehabilitation regime.: Yes Analysis of laboratory values and/or radiology reports with any subsequent need for medication adjustment and/or medical intervention. : Yes   I attest that I was present, lead the  team conference, and concur with the assessment and plan of the team.   Jerene Monks 11/06/2023, 1051 am

## 2023-11-06 NOTE — Progress Notes (Signed)
 PROGRESS NOTE   Subjective/Complaints:  Pt resting in bedside chair. She reports R hip/pelvis pain improved today. No additional concerns this AM. Therapy had reported she had some knee buckling in prior session, not resulting in unsteadiness or loss of balance.  ROS: Limited due to cognition.. Denies CP, SOB, abd pain, N/V/D/C, or any other complaints at this time.   + R hip/pelvis area pain -- improved + Incontinence-- has foley now   Objective:   No results found.    Recent Labs    11/05/23 0548  WBC 5.0  HGB 10.6*  HCT 32.8*  PLT 514*    Recent Labs    11/05/23 0548  NA 133*  K 4.4  CL 100  CO2 27  GLUCOSE 109*  BUN 12  CREATININE 0.61  CALCIUM  8.6*      Intake/Output Summary (Last 24 hours) at 11/06/2023 0955 Last data filed at 11/06/2023 0900 Gross per 24 hour  Intake 377 ml  Output 3000 ml  Net -2623 ml        Physical Exam: Vital Signs Blood pressure 127/60, pulse 92, temperature 97.6 F (36.4 C), temperature source Oral, resp. rate 18, height 5\' 8"  (1.727 m), weight 76.2 kg, SpO2 98%.    General: No apparent distress, laying in bedside chair HEENT: Head is normocephalic, atraumatic, sclera anicteric, oral mucosa pink and moist Neck: Supple without JVD or lymphadenopathy Heart: Reg rate and rhythm. No murmurs rubs or gallops Chest: CTA bilaterally without wheezes, rales, or rhonchi; no distress Abdomen: Soft, non-tender, non-distended, bowel sounds positive.  Psych: Pt's affect is flat. Pt is cooperative GU: clear yellow urine Skin: Clean and intact without signs of breakdown  Musculoskeletal:      R greater trochanter and thigh TTP- improved today, no apparent edema or deformity.        Sleeping but wakes to voice, and oriented x 4. Moderate memory and cognitive deficits, poor insight into current condition.--Ongoing.  No apparent delusions or hallucinations.  Cranial nerve 2-12  grossly intact Strength 4 out of 5 throughout-except 3+ out of 5 right hip flexor due to pain No obvious sensory deficits      Assessment/Plan: 1. Functional deficits which require 3+ hours per day of interdisciplinary therapy in a comprehensive inpatient rehab setting. Physiatrist is providing close team supervision and 24 hour management of active medical problems listed below. Physiatrist and rehab team continue to assess barriers to discharge/monitor patient progress toward functional and medical goals  Care Tool:  Bathing    Body parts bathed by patient: Right arm, Left arm, Chest, Abdomen, Front perineal area, Buttocks, Right upper leg, Left upper leg, Right lower leg, Left lower leg, Face   Body parts bathed by helper: Front perineal area, Buttocks     Bathing assist Assist Level: Minimal Assistance - Patient > 75% (MIN for standing balance in shower)     Upper Body Dressing/Undressing Upper body dressing   What is the patient wearing?: Pull over shirt, Bra    Upper body assist Assist Level: Supervision/Verbal cueing    Lower Body Dressing/Undressing Lower body dressing      What is the patient wearing?: Underwear/pull up, Pants  Lower body assist Assist for lower body dressing: Moderate Assistance - Patient 50 - 74%     Toileting Toileting    Toileting assist Assist for toileting: Contact Guard/Touching assist     Transfers Chair/bed transfer  Transfers assist     Chair/bed transfer assist level: Contact Guard/Touching assist     Locomotion Ambulation   Ambulation assist   Ambulation activity did not occur: Safety/medical concerns  Assist level: Contact Guard/Touching assist Assistive device: Walker-rolling Max distance: 137'   Walk 10 feet activity   Assist  Walk 10 feet activity did not occur: Safety/medical concerns  Assist level: Contact Guard/Touching assist Assistive device: Walker-rolling   Walk 50 feet activity   Assist  Walk 50 feet with 2 turns activity did not occur: Safety/medical concerns (pain)  Assist level: Contact Guard/Touching assist Assistive device: Walker-rolling    Walk 150 feet activity   Assist Walk 150 feet activity did not occur: Safety/medical concerns  Assist level: Minimal Assistance - Patient > 75% Assistive device: Walker-rolling    Walk 10 feet on uneven surface  activity   Assist Walk 10 feet on uneven surfaces activity did not occur: Safety/medical concerns         Wheelchair     Assist Is the patient using a wheelchair?: Yes Type of Wheelchair: Manual    Wheelchair assist level: Dependent - Patient 0%      Wheelchair 50 feet with 2 turns activity    Assist        Assist Level: Dependent - Patient 0%   Wheelchair 150 feet activity     Assist      Assist Level: Dependent - Patient 0%   Blood pressure 127/60, pulse 92, temperature 97.6 F (36.4 C), temperature source Oral, resp. rate 18, height 5\' 8"  (1.727 m), weight 76.2 kg, SpO2 98%.  Medical Problem List and Plan: 1. Functional deficits secondary to left sacral ala and superior pubic rami fracture.  Weightbearing as tolerated per Dr. Guyann Leitz             -patient may shower -ELOS/Goals: 10-14 days, supervision goals with PT, and sup/min assist with OT and SLP-dispo to SNF   4/29: Setup-SPV UB, Md-Max A LBD. Needing a LOT of encouragement for standing tasks and transfers - cog is limiting. Husband not safe to assist - unstable with practicing with PT/OT. Son not participating in self-care activitied but can assist some. Plan for dispo to SNF   5-2: Discussed new urinary retention with Ortho PA; repeat pelvic x-rays today to ensure fracture stability, but not uncommon around expected as a result of this injury.  No need for CT. SW working on SNF options  5/6 knee buckling reported by therapy- strength appears at baseline, continue to monitor for now.   -Team conference note  completed  -DC to [facility]-bed available today  2.  Antithrombotics: -DVT/anticoagulation:  Pharmaceutical: Lovenox  30mg  BID.  Check vascular study-negative              -antiplatelet therapy: Aspirin  81 mg daily  3. Pain Management: Lidoderm  patch, oxycodone  as needed  -4/24 Check xray pelvis/Hip right-- stable  -Muscle rub -10/27/23 xray stable; PT noting that she doesn't ask for PRNs, and mostly has pain during sessions; will schedule 500mg  tylenol  TID and see if that helps for sessions.  4-28: Ongoing 8 out of 10 pain overnight, with significant hypertension associated.  Increase Tylenol  to 1000 mg 3 times daily, schedule oxycodone  5 mg 3 times daily, reduce  as needed oxycodone  to 2.5 to 5 mg every 6 hours as needed. 4-29: Does feel pain is improved, patient with poor insight about quality of pain and changes.  Continue current regimen, hesitant to further increase due to cognitive deficits. 4/30: DC standing oxycodone  due to concerns for contribution to urinary retention.  Add Voltaren  gel 4 times daily to hips and Robaxin  as needed. 5-1: DC oxycodone  for ongoing urinary retention and constipation, changed to tramadol  50 to 100 mg every 6 hours as needed for pain. 5-2: Pain better controlled.  Continue with tramadol  as needed, may reduce to Tylenol  if pain remains good over the weekend.  Cannot take NSAIDs due to aspirin  allergy. 5-6 Pain well controlled today, continue current regimen for now   4. Mood/Behavior/Sleep: Namenda  10 mg twice daily, Cymbalta  60 mg twice daily             -antipsychotic agents: N/A - 5-2: Family reporting frequent phone calls with patient confusion, waxing and waning, consistent with delirium.  Described risk factors and treatment, placed delirium precautions.  5. Neuropsych/cognition: This patient is not quite capable of making decisions on her own behalf. 6. Skin/Wound Care: Routine skin checks 7. Fluids/Electrolytes/Nutrition: Routine in and outs with  follow-up chemistries, cont vitamins/supplements  - stable 8.  Acute on chronic anemia/history of GI bleed/colon cancer.  Patient receives outpatient iron  infusions.  Continue iron  supplement.  Follow-up per hematology services Dr.Gorsuch  -4/22 HGB stable 8.3  - Stable at 8-9  9.  Epilepsy.  Tegretol  600 mg twice daily as well as zarontin  500 mg twice daily. 10.  CAD/CABG/history of stents/PPM.  Continue low-dose aspirin .  Follow-up cardiology service Dr. [place].  Continue Imdur  15 mg daily. Denies CP 11.  Hypothyroidism.  Synthroid  50mcg daily 12.  Hyponatremia.  Sodium chloride  tablets 1 g 3 times daily.  Follow-up chemistries tomorrow  -4/24 Up to 135  4-28: 132 today, seems about baseline.  Monitor in 2 to 3 days.  5-1: 135.  Reduce salt tabs to 1 g twice daily.  Repeat Monday.  -5/5  NA overall stable at 133, continue current regimen  Recommend recheck at SNF within week  13.Hypertension. Norvasc  5 mg daily. Monitor with increased mobility             -bp borderline at present.  4/22 increase norvasc  to 7.5  4/24-25 fair control, continue current regimen and monitor  -4/26-27/25 BPs variable but fair, monitor.  4-28: Significant hypertension overnight, likely associated with pain.  Monitor today with increased regimen as above.  Increase Norvasc  to 10 mg for tomorrow a.m.; benefit from rescheduling to nightly 4/29: Blood pressure improved today. Monitor with increase 2-3 days--improving 4/30: Staying in 140s to 150s over 60s, monitor 1 more day and may add ARB if no improvement (has had cough with ACE in the past).  Per pharmacy, Norvasc  may also contribute to urinary retention, so may have to adjust this 5-1: Some intermittent hypertension, but now normotensive mostly.  Continue current regimen. -11/03/23 BPs variable, but close to normotension mostly; monitor -5/5 continue to monitor for now, if BP remains elevated consider additional medication tomorrow  -5/6 BM improved this AM,  continue to monitor trend for now  Vitals:   11/02/23 1939 11/03/23 0415 11/03/23 1253 11/03/23 1956  BP: (!) 152/68 131/64 (!) 141/71 138/69   11/04/23 0406 11/04/23 0900 11/04/23 1439 11/04/23 2004  BP: 119/71 137/62 (!) 154/63 (!) 152/69   11/05/23 0536 11/05/23 1355 11/05/23 1951 11/06/23 0518  BP: (!) 166/72 (!) 150/65 (!) 159/72 127/60     14. Fibromyalgia   -Hx of fibromyalgia noted.   -Will start gabapentin  100mg  BID  -Increase gabapentin  to 100mg  TID--improved  15. Urine incontinence/UTI - Discussed with husband reports that she did not have frequent incontinence until recent hospitalization  -4/24 Check UA and culture -10/27/23 U/A c/w UTI, started on keflex  250mg  QID on 10/25/23 PM, EOT 10/30/23 PM-- UCx pending, will follow -10/28/23 UCx with >100k CFU E.coli, 40k CFU proteus mirabilus, susceptibilities pending; cont keflex  for now, await susceptibilities  4-28: Both species pansensitive, continue Keflex  4-29.  Multiple, large-volume straight caths overnight, 2x >900.  Started on Flomax  0.4 mg daily.  Every 6 hour PVR/bladder scans. 4/30: Keflex  finished. Retention ongoing. Repeat UA negative, increase flomax  to 0.8 mg at bedtime.  See #17 below--constipation and oxycodone  likely contributors 5-1: Ongoing need for intermittent straight caths, pending renal ultrasound today given repeated high-volume caths may need to place Foley catheter.  Family made aware.  DC oxycodone  as above, patient had large bowel movement yesterday. 5-2: Renal ultrasound normal.  Continues with high-volume caths.  Discussed with family, will place Foley today, repeat UA due to new foul odor, arrange outpatient urology follow-up.  Per Ortho, not uncommon with pelvic ring fractures. -11/03/23 UCx from yesterday with E.coli >100k CFU. Previously on Keflex , consulted pharmacy, could do cefodroxil 500mg  BID x5 days (10 doses starting today) -11/04/23 UCx Ecoli showing intermediate resistance to ampicillin but  sensitive to all others; continue cefodroxil as prescribed; MAY need to consider exchange of foley after treatment?-  think this should be OK as it was started around same time as UTI tratement -5/5 Continue Cefadroxil, WBC 5.0 today-not elevated   -5/6 clear yellow urine in foley bag, continue current regimen  16  Hypercholesterolemia -Husband report he brought it [place] from home, last dose 4/13. Called pharmacy to discuss  55. Bowel function - 4/24 add Senokot S 2 tabs QHS, continue MiraLAX  twice daily, colace 100mg  BID  4/30: Increase sennakot s tro 2 tabs BID fpr small Bms; KUB today for constipation contributing to urinary retention --large stool burden seen in proximal and transverse colon, ordered nursing to give sorbitol  and follow-up with soapsuds enema if no result. 5-1: Large bowel movement with sorbitol  yesterday; patient refused enema. -5/6 LBM yesterday    LOS: 15 days A FACE TO FACE EVALUATION WAS PERFORMED  Lylia Sand 11/06/2023, 9:55 AM

## 2023-11-08 DIAGNOSIS — R41841 Cognitive communication deficit: Secondary | ICD-10-CM | POA: Diagnosis not present

## 2023-11-08 DIAGNOSIS — E441 Mild protein-calorie malnutrition: Secondary | ICD-10-CM | POA: Diagnosis not present

## 2023-11-08 DIAGNOSIS — R1312 Dysphagia, oropharyngeal phase: Secondary | ICD-10-CM | POA: Diagnosis not present

## 2023-11-08 DIAGNOSIS — K219 Gastro-esophageal reflux disease without esophagitis: Secondary | ICD-10-CM | POA: Diagnosis not present

## 2023-11-08 DIAGNOSIS — M6281 Muscle weakness (generalized): Secondary | ICD-10-CM | POA: Diagnosis not present

## 2023-11-08 DIAGNOSIS — R2689 Other abnormalities of gait and mobility: Secondary | ICD-10-CM | POA: Diagnosis not present

## 2023-11-08 DIAGNOSIS — R569 Unspecified convulsions: Secondary | ICD-10-CM | POA: Diagnosis not present

## 2023-11-08 DIAGNOSIS — S3289XD Fracture of other parts of pelvis, subsequent encounter for fracture with routine healing: Secondary | ICD-10-CM | POA: Diagnosis not present

## 2023-11-08 DIAGNOSIS — R2681 Unsteadiness on feet: Secondary | ICD-10-CM | POA: Diagnosis not present

## 2023-11-08 DIAGNOSIS — F32A Depression, unspecified: Secondary | ICD-10-CM | POA: Diagnosis not present

## 2023-11-08 DIAGNOSIS — I2510 Atherosclerotic heart disease of native coronary artery without angina pectoris: Secondary | ICD-10-CM | POA: Diagnosis not present

## 2023-11-08 DIAGNOSIS — S32512D Fracture of superior rim of left pubis, subsequent encounter for fracture with routine healing: Secondary | ICD-10-CM | POA: Diagnosis not present

## 2023-11-08 DIAGNOSIS — F0390 Unspecified dementia without behavioral disturbance: Secondary | ICD-10-CM | POA: Diagnosis not present

## 2023-11-08 DIAGNOSIS — I10 Essential (primary) hypertension: Secondary | ICD-10-CM | POA: Diagnosis not present

## 2023-11-12 DIAGNOSIS — S3289XD Fracture of other parts of pelvis, subsequent encounter for fracture with routine healing: Secondary | ICD-10-CM | POA: Diagnosis not present

## 2023-11-12 DIAGNOSIS — R2681 Unsteadiness on feet: Secondary | ICD-10-CM | POA: Diagnosis not present

## 2023-11-12 DIAGNOSIS — F0390 Unspecified dementia without behavioral disturbance: Secondary | ICD-10-CM | POA: Diagnosis not present

## 2023-11-12 DIAGNOSIS — R41841 Cognitive communication deficit: Secondary | ICD-10-CM | POA: Diagnosis not present

## 2023-11-12 DIAGNOSIS — E441 Mild protein-calorie malnutrition: Secondary | ICD-10-CM | POA: Diagnosis not present

## 2023-11-12 DIAGNOSIS — F32A Depression, unspecified: Secondary | ICD-10-CM | POA: Diagnosis not present

## 2023-11-12 DIAGNOSIS — R2689 Other abnormalities of gait and mobility: Secondary | ICD-10-CM | POA: Diagnosis not present

## 2023-11-12 DIAGNOSIS — M6281 Muscle weakness (generalized): Secondary | ICD-10-CM | POA: Diagnosis not present

## 2023-11-12 DIAGNOSIS — R569 Unspecified convulsions: Secondary | ICD-10-CM | POA: Diagnosis not present

## 2023-11-12 DIAGNOSIS — R1312 Dysphagia, oropharyngeal phase: Secondary | ICD-10-CM | POA: Diagnosis not present

## 2023-11-14 ENCOUNTER — Other Ambulatory Visit (HOSPITAL_COMMUNITY): Payer: Self-pay

## 2023-11-15 DIAGNOSIS — F32A Depression, unspecified: Secondary | ICD-10-CM | POA: Diagnosis not present

## 2023-11-15 DIAGNOSIS — R2681 Unsteadiness on feet: Secondary | ICD-10-CM | POA: Diagnosis not present

## 2023-11-15 DIAGNOSIS — R1312 Dysphagia, oropharyngeal phase: Secondary | ICD-10-CM | POA: Diagnosis not present

## 2023-11-15 DIAGNOSIS — S3289XD Fracture of other parts of pelvis, subsequent encounter for fracture with routine healing: Secondary | ICD-10-CM | POA: Diagnosis not present

## 2023-11-15 DIAGNOSIS — R2689 Other abnormalities of gait and mobility: Secondary | ICD-10-CM | POA: Diagnosis not present

## 2023-11-15 DIAGNOSIS — F0390 Unspecified dementia without behavioral disturbance: Secondary | ICD-10-CM | POA: Diagnosis not present

## 2023-11-15 DIAGNOSIS — R569 Unspecified convulsions: Secondary | ICD-10-CM | POA: Diagnosis not present

## 2023-11-15 DIAGNOSIS — R41841 Cognitive communication deficit: Secondary | ICD-10-CM | POA: Diagnosis not present

## 2023-11-15 DIAGNOSIS — M6281 Muscle weakness (generalized): Secondary | ICD-10-CM | POA: Diagnosis not present

## 2023-11-15 DIAGNOSIS — E441 Mild protein-calorie malnutrition: Secondary | ICD-10-CM | POA: Diagnosis not present

## 2023-11-20 ENCOUNTER — Encounter (HOSPITAL_COMMUNITY): Payer: Medicare Other

## 2023-11-20 DIAGNOSIS — I495 Sick sinus syndrome: Secondary | ICD-10-CM

## 2023-11-21 LAB — CUP PACEART REMOTE DEVICE CHECK
Battery Remaining Longevity: 39 mo
Battery Voltage: 2.95 V
Brady Statistic AP VP Percent: 0.01 %
Brady Statistic AP VS Percent: 1.42 %
Brady Statistic AS VP Percent: 0.07 %
Brady Statistic AS VS Percent: 98.51 %
Brady Statistic RA Percent Paced: 1.42 %
Brady Statistic RV Percent Paced: 0.08 %
Date Time Interrogation Session: 20250521102549
Implantable Lead Connection Status: 753985
Implantable Lead Connection Status: 753985
Implantable Lead Implant Date: 20160922
Implantable Lead Implant Date: 20160922
Implantable Lead Location: 753859
Implantable Lead Location: 753860
Implantable Lead Model: 5076
Implantable Lead Model: 5076
Implantable Pulse Generator Implant Date: 20160922
Lead Channel Impedance Value: 1330 Ohm
Lead Channel Impedance Value: 1349 Ohm
Lead Channel Impedance Value: 361 Ohm
Lead Channel Impedance Value: 437 Ohm
Lead Channel Pacing Threshold Amplitude: 0.75 V
Lead Channel Pacing Threshold Amplitude: 1.25 V
Lead Channel Pacing Threshold Pulse Width: 0.4 ms
Lead Channel Pacing Threshold Pulse Width: 0.4 ms
Lead Channel Sensing Intrinsic Amplitude: 2.5 mV
Lead Channel Sensing Intrinsic Amplitude: 2.5 mV
Lead Channel Sensing Intrinsic Amplitude: 6.75 mV
Lead Channel Sensing Intrinsic Amplitude: 6.75 mV
Lead Channel Setting Pacing Amplitude: 2.5 V
Lead Channel Setting Pacing Amplitude: 2.5 V
Lead Channel Setting Pacing Pulse Width: 0.4 ms
Lead Channel Setting Sensing Sensitivity: 2.8 mV
Zone Setting Status: 755011
Zone Setting Status: 755011

## 2023-11-28 DIAGNOSIS — S32810D Multiple fractures of pelvis with stable disruption of pelvic ring, subsequent encounter for fracture with routine healing: Secondary | ICD-10-CM | POA: Diagnosis not present

## 2023-11-28 NOTE — Progress Notes (Deleted)
 Cardiology Office Note   Date:  11/28/2023   ID:  [name], DOB 1950-07-11, MRN [id number]  PCP:  Victorio Grave, MD  Cardiologist:   Chaney Maclaren [place], MD   No chief complaint on file.  History of Present Illness: Rachel Clark is a 73 y.o. female is seen for follow up CAD. 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 Subsequent failure of SVG to RCA. S/p extensive stenting of RCA in the past- last intervention in 2017 with PTCA of ostial RCA for restenosis. In August 2019 stented RCA was widely patent.    Since November 2021 she has had multiple episodes of dizziness and syncope. She was found to be orthostatic. Seen in our clinic and later by Dr Nunzio Belch. Pacemaker function normal. Imdur  was discontinued. Hydration seemed to improve symptoms. Echo was unremarkable.   Was seen in the ED on 09/04/20 with recurrent syncope after standing. She was orthostatic. Labs were unremarkable except mildly decreased sodium. No changes made at that time. Seen again in ED on May 11 with fall. Labs ok except low sodium level, pacer check and CXR Ok. Again hydrated. Her husband reports she passed out again on Father's day. Really doesn't have any warning. Is out only a few seconds. She has liberalized sodium intake. Wearing compression hose. Husband reports she naps 60% of the day. Seen recently by Dr Tilda Fogo. No seizures noted. Husband has kept a BP record. Sometimes has a 20 point drop in BP with standing but at other times no change. We stopped her amlodipine .   She was admitted 03/08/21 with a NSTEMI. Ecg showed diffuse ST depression. Troponin peaked at 39. She underwent repeat cardiac cath showing patent LIMA to the LAD and patent stents in the RCA. There was new disease in a large ramus branch with sequential high grade stenoses. She had  PCI which was difficult due to tortuosity and calcification of the vessel making deliverablility of stents difficult. The disease was successfully covered with 3 DES. She was placed on Effient  due to interaction of Brilinta  with Tegretol . DC the following day. She has a history of rash on Plavix . Indefinite DAPT recommended due to extensive stents. Hgb at time of DC was 12.1.   She was readmitted on 03/13/21 with acute upper GI bleed with coffee ground emesis. Hgb dropped to 7.7 then to 6.5. she was transfused multiple units of PRBCs. DAPT was continued given recent stents. EGD showed a visible vessel in the gastric antrum treated with epinephrine  injection and clipping. Treated with PPI. Bleeding scan was subsequently negative. Hgb improved to 9.6 at discharge.   She was seen in EP clinic in January. No significant arrhythmia noted and pacer OK.  She was admitted to [facility] in May 2023 with chest pain.  With low-level troponins she was initially treated medically and started on Imdur .  She returned to the ED later that evening with troponin rise in the 200s.  She underwent LHC with Difficult/complex but successful scoring balloon angioplasty with shockwave lithotripsy followed by distal overlapping DES stent placement to LCX.  With hemoglobin drop to 7.3, she received 2 units of pRBCs.   She underwent EGD showing small prepyloric gastric ulcer with no evidence of  active bleeding, some gastritis. Recommendations for IV protonix  BID while inpatient, then switch to protonix  40mg  BID for 4 weeks at discharge, then  protonix  40mg  daily indefinitely. Was seen in June and doing OK. No bleeding. Repeat CBC in July showed stable Hgb 10.2.   She was evaluated in the ED on 01/23/2023 in the setting of chest pain.  Troponin was negative.  Outpatient follow-up was advised.  She was seen in the office on 02/02/2023 and reported recurrent chest pain.  She was scheduled for outpatient cardiac catheterization and was started on  Ranexa .  However, prior to her catheterization she presented to the ED on 02/03/2023 with chest pain.  Troponin was negative.  Cardiac catheterization was canceled and instead she underwent Lexiscan  Myoview  which was low risk.  Ranexa  was discontinued due to interaction with Tegretol .  She was started on amlodipine  5 mg daily.  Hemoglobin dropped to as low as 8.4 during admission, she received 1 unit PRBC.  EGD showed LA grade B esophagitis and gastric erosions, no active bleeding.  Follow-up with GI was recommended.  She was discharged home in stable condition on 02/08/2023.     She was admitted in Jan with hematochezia 2 weeks post colonoscopy with removal of multiple polyps. Repeat colonoscopy showed colonic ulcer that was clipped. Also had some oozing AVMs that were treated. She was seen in ED in late Feb following a fall. Head CT negative. Treated for UTI.   I saw her in April and she was doing well without chest pain or dyspnea. Husband notes she is sleeping a lot more during the day. She is getting iron  infusions with Dr Marton Sleeper and is planning repeat colonoscopy in a couple of months with Dr Honey Lusty.   She fell in April and was admitted April 19-May 6 with pubic fracture. 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 Effient  was held.     Past Medical History:  Diagnosis Date   Arthritis    "fingers" (06/29/2016)   Chronic lower back pain    Colon cancer (HCC) 12/04/2011   s/p Laparoscopic-assisted transverse colectomy on 12/19/2011 by Dr. Delane Fear.  pT3 N0 M0.    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   last seizure was in 1972 ;takes Tegretol  (06/29/2016)    Headache    "weekly" (06/29/2016)   Hyperlipidemia    Hyponatremia    Hypothyroid    Iron  deficiency anemia 11/17/2011   Memory change  11/13/2016   Monoclonal gammopathy of unknown significance    Orthostatic hypotension    Presence of permanent cardiac pacemaker    Syncope and collapse    pacemaker implanted    Past Surgical History:  Procedure Laterality Date   ANTERIOR CERVICAL DECOMP/DISCECTOMY FUSION  1980's   BACK SURGERY     CARDIAC CATHETERIZATION  05/20/2009   obstructive native vessel disease in LAD, RCA, and first diagonal, patent vein graft to distal RCA and LIMA to LAD,normal. ef 60%   CARDIAC CATHETERIZATION N/A 03/24/2015   Procedure: Left Heart Cath and Cors/Grafts Angiography;  Surgeon: Wenona Hamilton, MD;  Location: [facility] INVASIVE CV LAB;  Service: Cardiovascular;  Laterality: N/A;   CARDIAC CATHETERIZATION N/A 07/28/2015   Procedure: Left Heart Cath and Coronary Angiography;  Surgeon: Millicent Ally, MD;  Location: [facility] CV [facility];  Service: Cardiovascular;  Laterality: N/A;   CARDIAC CATHETERIZATION N/A 11/09/2015  Procedure: Coronary Stent Intervention;  Surgeon: Arnoldo Lapping, MD;  Location: [facility] INVASIVE CV LAB;  Service: Cardiovascular;  Laterality: N/A;   CARDIAC CATHETERIZATION N/A 11/09/2015   Procedure: Left Heart Cath and Coronary Angiography;  Surgeon: Arnoldo Lapping, MD;  Location: [facility] INVASIVE CV LAB;  Service: Cardiovascular;  Laterality: N/A;   CARDIAC CATHETERIZATION N/A 06/29/2016   Procedure: Left Heart Cath and Coronary Angiography;  Surgeon: Sammy Crisp, MD;  Location: [facility] INVASIVE CV LAB;  Service: Cardiovascular;  Laterality: N/A;   CARDIAC CATHETERIZATION N/A 06/29/2016   Procedure: Intravascular Pressure Wire/FFR Study;  Surgeon: Sammy Crisp, MD;  Location: [facility] INVASIVE CV LAB;  Service: Cardiovascular;  Laterality: N/A;   CARDIAC CATHETERIZATION N/A 06/29/2016   Procedure: Coronary Balloon Angioplasty;  Surgeon: Sammy Crisp, MD;  Location: [facility] CV [facility];  Service: Cardiovascular;  Laterality: N/A;   COLON RESECTION  12/19/2011   Procedure: COLON RESECTION  LAPAROSCOPIC;  Surgeon: Enid Harry, MD;  Location: [facility] OR;  Service: General;  Laterality: N/A;  laparoscopic hand assisted partial colon resection   COLON SURGERY     COLONOSCOPY     COLONOSCOPY WITH PROPOFOL  N/A 07/28/2023   Procedure: COLONOSCOPY WITH PROPOFOL ;  Surgeon: Felecia Hopper, MD;  Location: [facility] ENDOSCOPY;  Service: Gastroenterology;  Laterality: N/A;   CORONARY ANGIOPLASTY WITH STENT PLACEMENT  ~ 2007   1 stent   CORONARY ARTERY BYPASS GRAFT  2007   "CABG X2"   DILATION AND CURETTAGE OF UTERUS  1973   EP IMPLANTABLE DEVICE N/A 03/25/2015   MDT Advisa DR pacemaker implanted by Dr Nunzio Belch for transient complete heart block and syncope   ESOPHAGOGASTRODUODENOSCOPY (EGD) WITH PROPOFOL  N/A 03/14/2021   Procedure: ESOPHAGOGASTRODUODENOSCOPY (EGD) WITH PROPOFOL ;  Surgeon: Genell Ken, MD;  Location[facility]COPY;  Service: Gastroenterology;  Laterality: N/A;   ESOPHAGOGASTRODUODENOSCOPY (EGD) WITH PROPOFOL  N/A 11/13/2021   Procedure: ESOPHAGOGASTRODUODENOSCOPY (EGD) WITH PROPOFOL ;  Surgeon: Felecia Hopper, MD;  Location: [facility] ENDOSCOPY;  Service: Gastroenterology;  Laterality: N/A;   ESOPHAGOGASTRODUODENOSCOPY (EGD) WITH PROPOFOL  N/A 02/07/2023   Procedure: ESOPHAGOGASTRODUODENOSCOPY (EGD) WITH PROPOFOL ;  Surgeon: Felecia Hopper, MD;  Location: [facility] ENDOSCOPY;  Service: Gastroenterology;  Laterality: N/A;   HEMOSTASIS CLIP PLACEMENT  03/14/2021   Procedure: HEMOSTASIS CLIP PLACEMENT;  Surgeon: Genell Ken, MD;  Location: [facility] ENDOSCOPY;  Service: Gastroenterology;;   HEMOSTASIS CLIP PLACEMENT  07/28/2023   Procedure: HEMOSTASIS CLIP PLACEMENT;  Surgeon: Felecia Hopper, MD;  Location: [facility] ENDOSCOPY;  Service: Gastroenterology;;   HOT HEMOSTASIS N/A 07/28/2023   Procedure: HOT HEMOSTASIS (ARGON PLASMA COAGULATION/BICAP);  Surgeon: Felecia Hopper, MD;  Location: [facility] ENDOSCOPY;  Service: Gastroenterology;  Laterality: N/A;   INSERT / REPLACE / REMOVE PACEMAKER     LEFT HEART CATH AND  CORONARY ANGIOGRAPHY N/A 11/11/2021   Procedure: LEFT HEART CATH AND CORONARY ANGIOGRAPHY;  Surgeon: Arleen Lacer, MD;  Location: [facility] INVASIVE CV LAB;  Service: Cardiovascular;  Laterality: N/A;   LEFT HEART CATH AND CORS/GRAFTS ANGIOGRAPHY N/A 02/21/2018   Procedure: LEFT HEART CATH AND CORS/GRAFTS ANGIOGRAPHY;  Surgeon: Avanell Leigh, MD;  Location: [facility]IVE CV LAB;  Service: Cardiovascular;  Laterality: N/A;   LEFT HEART CATH AND CORS/GRAFTS ANGIOGRAPHY N/A 03/08/2021   Procedure: LEFT HEART CATH AND CORS/GRAFTS ANGIOGRAPHY;  Surgeon: [place], Riley Papin M, MD;  Location: [facility] INVASIVE CV LAB;  Service: Cardiovascular;  Laterality: N/A;   PORT-A-CATH REMOVAL N/A 06/12/2013   Procedure: REMOVAL PORT-A-CATH;  Surgeon: Enid Harry, MD;  Location: [facility];  Service: General;  Laterality: N/A;   PORTACATH PLACEMENT  06/04/2012   Procedure: INSERTION PORT-A-CATH;  Surgeon: Enid Harry, MD;  Location: [facility] OR;  Service: General;  Laterality: N/A;  Insertion of port-a-cath    POSTERIOR LAMINECTOMY / DECOMPRESSION CERVICAL SPINE  1990s   SCLEROTHERAPY  03/14/2021   Procedure: SCLEROTHERAPY;  Surgeon: Genell Ken, MD;  Location: [facility] ENDOSCOPY;  Service: Gastroenterology;;   VAGINAL HYSTERECTOMY       Current Outpatient Medications  Medication Sig Dispense Refill   acetaminophen  (TYLENOL ) 500 MG tablet Take 1,000 mg by mouth every 6 (six) hours as needed for mild pain (pain score 1-3).     amLODipine  (NORVASC ) 10 MG tablet Take 1 tablet (10 mg total) by mouth daily.     aspirin  EC 81 MG tablet Take 1 tablet (81 mg total) by mouth daily. Swallow whole.     Bempedoic Acid -Ezetimibe  (NEXLIZET ) 180-10 MG TABS Take 180 mg by mouth daily. 90 tablet 3   calcium  carbonate (TUMS - DOSED IN MG ELEMENTAL CALCIUM ) 500 MG chewable tablet Chew 500 mg by mouth daily.     carbamazepine  (TEGRETOL  XR) 200 MG 12 hr tablet Take 3 tablets (600 mg total) by mouth 2 (two) times daily. 540 tablet 4    cefadroxil  (DURICEF) 500 MG capsule Take 1 capsule (500 mg total) by mouth 2 (two) times daily.     cholecalciferol  (CHOLECALCIFEROL ) 25 MCG tablet Take 4 tablets (4,000 Units total) by mouth daily.     diclofenac  Sodium (VOLTAREN ) 1 % GEL Apply 4 g topically 4 (four) times daily.     docusate sodium  (COLACE) 100 MG capsule Take 1 capsule (100 mg total) by mouth 2 (two) times daily.     DULoxetine  (CYMBALTA ) 60 MG capsule Take 1 capsule (60 mg total) by mouth 2 (two) times daily.     ethosuximide  (ZARONTIN ) 250 MG capsule Take 2 capsules (500 mg total) by mouth 2 (two) times daily. 360 capsule 4   Evolocumab  (REPATHA  SURECLICK) 140 MG/ML SOAJ Inject 140 mg into the skin every 14 (fourteen) days. 2 mL 11   ferrous sulfate  325 (65 FE) MG tablet Take 325 mg by mouth daily with breakfast.     fluticasone  (FLONASE ) 50 MCG/ACT nasal spray Place 1 spray into both nostrils daily.     gabapentin  (NEURONTIN ) 100 MG capsule Take 1 capsule (100 mg total) by mouth 3 (three) times daily.     isosorbide  mononitrate (IMDUR ) 30 MG 24 hr tablet Take 0.5 tablets (15 mg total) by mouth daily.     levothyroxine  (SYNTHROID , LEVOTHROID) 50 MCG tablet Take 50 mcg by mouth daily before breakfast.     lidocaine  (LIDODERM ) 5 % Place 2 patches onto the skin daily. Remove & Discard patch within 12 hours or as directed by MD 30 patch 0   memantine  (NAMENDA ) 10 MG tablet Take 1 tablet (10 mg total) by mouth 2 (two) times daily. 180 tablet 4   methocarbamol  (ROBAXIN ) 500 MG tablet Take 1 tablet (500 mg total) by mouth every 8 (eight) hours as needed for muscle spasms.     nitroGLYCERIN  (NITROSTAT ) 0.4 MG SL tablet place 1 tablet under the tongue every 5 minutes as needed for chest pain 25 tablet 3   pantoprazole  (PROTONIX ) 40 MG tablet TAKE 1 TABLET BY MOUTH TWICE A DAY 180 tablet 3   polyethylene glycol (MIRALAX  / GLYCOLAX ) 17 g packet Take 17 g by mouth daily as needed.     prasugrel  (EFFIENT ) 10 MG TABS tablet TAKE ONE TABLET  BY MOUTH ONE TIME DAILY  90 tablet 0   sodium chloride  1 g tablet Take 1 tablet (1 g total) by mouth 2 (two) times daily with a meal.     tamsulosin  (FLOMAX ) 0.4 MG CAPS capsule Take 2 capsules (0.8 mg total) by mouth at bedtime.     traMADol  (ULTRAM ) 50 MG tablet Take 1-2 tablets (50-100 mg total) by mouth every 6 (six) hours as needed for severe pain (pain score 7-10) or moderate pain (pain score 4-6). 5 tablet 0   No current facility-administered medications for this visit.    Allergies:   Sulfamethoxazole, Aspirin , Crestor  [rosuvastatin ], Erythromycin, Lisinopril, Niacin and related, Penicillin g benzathine, Statins, Sulfa drugs cross reactors, Tetracyclines & related, Xeloda  [capecitabine ], Penicillins, and Plavix  [clopidogrel  bisulfate]    Social History:  The patient  reports that she has never smoked. She has never used smokeless tobacco. She reports that she does not drink alcohol and does not use drugs.   Family History:  The patient's family history includes Cancer in her mother; Heart attack in her father; Heart attack (age of onset: 74) in her brother; Heart disease in her father.    ROS:  Please see the history of present illness.   Otherwise, review of systems are positive for none.   All other systems are reviewed and negative.    PHYSICAL EXAM: VS:  There were no vitals taken for this visit. , BMI There is no height or weight on file to calculate BMI.   GEN: Well nourished, well developed, in no acute distress  HEENT: normal  Neck: no JVD, carotid bruits, or masses Cardiac: RRR; no murmurs, rubs, or gallops,no edema  Respiratory:  clear to auscultation bilaterally, normal work of breathing GI: soft, nontender, nondistended, + BS MS: no deformity or atrophy. No groin hematoma. Skin: warm and dry, no rash Neuro:  Strength and sensation are intact Psych: euthymic mood, full affect   EKG:  EKG is not ordered today.     Recent Labs: 07/27/2023: Magnesium  1.7 10/17/2023: TSH 1.280 11/03/2023: ALT 17 11/05/2023: BUN 12; Creatinine, Ser 0.61; Hemoglobin 10.6; Platelets 514; Potassium 4.4; Sodium 133   Dated 03/24/21: Hgb 12.2. normal BMET.  Dated 05/30/21: cholesterol 241, triglycerides 116, HDL 81, LDL 140. CMET and TSH normal. Dated 07/17/23: cholesterol 157, triglycerides 85, HDL 73, LDL 72   Lipid Panel    Component Value Date/Time   CHOL 131 10/20/2023 1908   CHOL 163 10/10/2023 1137   TRIG 86 10/20/2023 1908   HDL 64 10/20/2023 1908   HDL 88 10/10/2023 1137   CHOLHDL 2.0 10/20/2023 1908   VLDL 17 10/20/2023 1908   LDLCALC 50 10/20/2023 1908   LDLCALC 59 10/10/2023 1137       Wt Readings from Last 3 Encounters:  11/05/23 167 lb 15.9 oz (76.2 kg)  10/17/23 176 lb 9.4 oz (80.1 kg)  10/12/23 167 lb (75.8 kg)      Other studies Reviewed: Additional studies/ records that were reviewed today include:   Cardiac cath 02/21/18:  LEFT HEART CATH AND CORS/GRAFTS ANGIOGRAPHY    Conclusion    Previously placed Ost RCA to Mid RCA stent (unknown type) is widely patent. Prox LAD lesion is 100% stenosed. Origin lesion is 100% stenosed. The left ventricular systolic function is normal. LV end diastolic pressure is normal. The left ventricular ejection fraction is 55-65% by visual estimate.     Echo 07/30/20: IMPRESSIONS     1. Left ventricular ejection fraction, by estimation, is 60 to 65%. The  left ventricle has normal function. The left ventricle has no regional  wall motion abnormalities. Left ventricular diastolic parameters are  consistent with Grade I diastolic  dysfunction (impaired relaxation).   2. Right ventricular systolic function is normal. The right ventricular  size is normal.   3. The mitral valve is normal in structure. Trivial mitral valve  regurgitation. No evidence of mitral stenosis.   4. The aortic valve is tricuspid. Aortic valve regurgitation is not  visualized. Mild aortic valve sclerosis is present,  with no evidence of  aortic valve stenosis.   5. The inferior vena cava is normal in size with greater than 50%  respiratory variability, suggesting right atrial pressure of 3 mmHg.   Comparison(s): Prior images unable to be directly viewed, comparison made  by report only. No significant change from prior study. 07/26/15 EF 60-65%.  GLS -18.6%.   Cardiac cath 03/08/21:  LEFT HEART CATH AND CORS/GRAFTS ANGIOGRAPHY   Conclusion      Prox LAD lesion is 100% stenosed.   Origin lesion is 100% stenosed.   Ost LAD to Prox LAD lesion is 60% stenosed.   Ost 1st Diag to 1st Diag lesion is 85% stenosed.   Ramus lesion is 99% stenosed.   Ost Ramus to Ramus lesion is 90% stenosed.   Non-stenotic Ost RCA to Mid RCA lesion was previously treated.   A drug-eluting stent was successfully placed using a STENT ONYX FRONTIER 3.0X15.   A drug-eluting stent was successfully placed using a SYNERGY XD 3.0X16.   A drug-eluting stent was successfully placed using a SYNERGY XD 3.0X20.   Post intervention, there is a 0% residual stenosis.   Post intervention, there is a 0% residual stenosis.   The left ventricular systolic function is normal.   LV end diastolic pressure is normal.   The left ventricular ejection fraction is 55-65% by visual estimate.   The radiation dose exceeded thresholds defined in the "Patient Radiation Dose Management For Interventional Medical Procedures With Extensive Use of Fluoroscopy" policy. Specific follow up instructions will be provided to the patient prior to discharge.   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 This is the culprit Normal LV function Normal LVEDP Successful PCI of the Ramus intermediate with DES x 3 in overlapping fashion.   Plan: DAPT for one year. Anticipate possible DC tomorrow.    Diagnostic Dominance: Right Intervention   Cardiac cath 11/11/21:  LEFT HEART CATH AND CORONARY ANGIOGRAPHY   Conclusion      Ost LAD to Prox LAD lesion is 60% stenosed. Prox LAD lesion is 100% stenosed.   LIMA-graft was visualized by angiography and is normal in caliber.  The graft exhibits no disease.   SVG-dRCA: Origin lesion is 100% stenosed.  graft was not visualized due to known occlusion.   Ost RCA to Mid RCA overlapping full metal jacket DES stents, widely patent.   Ost 1st Diag to 1st Diag lesion is 85% stenosed.   ----------------------   Lesion segment #2 (all in-stent restenosis) Ost Ramus to Ramus lesion is 75% stenosed.  Ramus-2 lesion is 85% stenosed.  Ramus-1 lesion is 35% stenosed.   Scoring balloon angioplasty was performed using a BALLN SCOREFLEX 2.50X15 -> followed by post dilation with a [place] Balloon 3.0 mm x 15 mm   Post intervention, there is a 20% residual  stenosis  in the proximal portion of stent segment.  Post intervention, there is a 0% residual stenosis in the distal portion of the stent.   CULPRIT LESION #1: Ramus-3 lesion is 95% stenosed.-2 tandem locations of calcified stenosis.   Scoring balloon angioplasty was performed using a BALLN SCOREFLEX 2.50X15.   A drug-eluting stent was successfully placed (overlapping the distal stent) using a STENT ONYX FRONTIER 2.5X30 -> tapered postdilation from overlap 3.1 mm down to 2.8 mm distally.   Post intervention, there is a 0% residual stenosis.   ------------------------------------------------------------------------------   LV end diastolic pressure is normal.   There is no aortic valve stenosis.   SUMMARY Severe Native vessel CAD: stable severe proximal to mid LAD disease with LAD occlusion and patent LIMA-LAD,  widely patent full metal jacket stented RCA,  diffuse severe in-stent restenosis throughout the proximal and midportion of the stented segment of the native LCx with severe eccentric calcified lesions  distal to the stent. Difficult/complex but successful scoring balloon angioplasty with shockwave lithotripsy followed by distal overlapping DES stent placement (Onyx Frontier DES 2.5 x 30 tapered postdilation from 3.1 in the overlap segment to 2.8 distally) Scoreflex angioplasty followed by post dilation with 3.0 mm [place] balloon throughout the entire stented segment of the LCx reducing stenosis in the midportion to 10% and more proximally to 20%. Normal LVEDP   RECOMMENDATION Monitor overnight, anticipate discharge in the morning. Aggressive risk factor modification Essentially lifelong finding.  In coverage with DAPT x1 year       Randene Bustard, MD   Echo 02/04/23:  IMPRESSIONS     1. Left ventricular ejection fraction, by estimation, is 50 to 55%. The  left ventricle has low normal function. The left ventricle demonstrates  regional wall motion abnormalities (see scoring diagram/findings for  description). Left ventricular diastolic   parameters are consistent with Grade I diastolic dysfunction (impaired  relaxation).   2. Right ventricular systolic function is normal. The right ventricular  size is normal. Tricuspid regurgitation signal is inadequate for assessing  PA pressure.   3. The mitral valve is normal in structure. Mild to moderate mitral valve  regurgitation. No evidence of mitral stenosis.   4. The aortic valve is tricuspid. Aortic valve regurgitation is not  visualized. No aortic stenosis is present.   5. The inferior vena cava is normal in size with greater than 50%  respiratory variability, suggesting right atrial pressure of 3 mmHg.      Myoview  02/06/23: IMPRESSION: 1. No reversible ischemia or infarction.   2. Normal left ventricular wall motion.   3. Left ventricular ejection fraction 61%   4. Non invasive risk stratification*: Low ASSESSMENT AND PLAN:  1.  History of  orthostatic hypotension/and/or neurocardiogenic syncope. Pacer evaluation has been  normal. Continue liberalization of sodium in diet. Maintain good hydration. Need to allow more permissive BP. Will continue current doses of amlodipine  and Imdur  for now.   2. CAD. S/p CABG. S/p extensive stenting of RCA.  in 2017. S/p NSTEMI with complex stenting of a large ramus intermediate branch with DES x 3 in September 2022.  Patent LIMA to LAD. Patent stents in RCA. On ASA and Effient  indefinitely. Admitted in May 2023 with NSTEMI and complex PCI of the LCx by Dr Addie Holstein. Admitted this summer with chest pain. MI ruled out. Normal Myoview . EGD c/w severe esophagitis. Continue current therapy. On long term DAPT. She is > 1 year out from last stent so it would be ok to hold  Effient  for one week prior to colonoscopy.   3. Hyperlipidemia. LDL goal < 70. Last LDL 60. Excellent response with Repatha  with 50% reduction in LDL  Will repeat labs today  4. SSS. S/p pacemaker. Followed in device clinic.  5. Acute upper GI bleed requiring transfusion. S/p clipping of visible vessel in gastric antrum. More recent EGD in May showed gastric ulcer (nonbleeding) and gastritis.  On PPI. 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 Plan repeat colonoscopy. Iron  infusions per hematology.   6. History of seizures. Monitored by Dr Tilda Fogo.   7. Chronic mild hyponatremia.   8. Memory loss/dementia.    Current medicines are reviewed at length with the patient today.  The patient does not have concerns regarding medicines.  The following changes have been made:  See above.  Labs/ tests ordered today include: none  No orders of the defined types were placed in this encounter.     Disposition:   FU 6 months  Signed, Pebbles Zeiders Sw[place], MD  11/28/2023 4:34 PM    [facility] [address] [place], [place], [address] Phone [phone], Fax [phone]

## 2023-11-29 ENCOUNTER — Ambulatory Visit: Payer: Self-pay | Admitting: Cardiovascular Disease

## 2023-11-29 DIAGNOSIS — F32A Depression, unspecified: Secondary | ICD-10-CM | POA: Diagnosis not present

## 2023-11-29 DIAGNOSIS — R569 Unspecified convulsions: Secondary | ICD-10-CM | POA: Diagnosis not present

## 2023-11-29 DIAGNOSIS — M6281 Muscle weakness (generalized): Secondary | ICD-10-CM | POA: Diagnosis not present

## 2023-11-29 DIAGNOSIS — E441 Mild protein-calorie malnutrition: Secondary | ICD-10-CM | POA: Diagnosis not present

## 2023-11-29 DIAGNOSIS — F0390 Unspecified dementia without behavioral disturbance: Secondary | ICD-10-CM | POA: Diagnosis not present

## 2023-11-29 DIAGNOSIS — R2681 Unsteadiness on feet: Secondary | ICD-10-CM | POA: Diagnosis not present

## 2023-11-29 DIAGNOSIS — R41841 Cognitive communication deficit: Secondary | ICD-10-CM | POA: Diagnosis not present

## 2023-11-29 DIAGNOSIS — R1312 Dysphagia, oropharyngeal phase: Secondary | ICD-10-CM | POA: Diagnosis not present

## 2023-11-29 DIAGNOSIS — S3289XD Fracture of other parts of pelvis, subsequent encounter for fracture with routine healing: Secondary | ICD-10-CM | POA: Diagnosis not present

## 2023-11-29 DIAGNOSIS — R2689 Other abnormalities of gait and mobility: Secondary | ICD-10-CM | POA: Diagnosis not present

## 2023-11-30 ENCOUNTER — Ambulatory Visit: Admitting: Cardiology

## 2023-12-03 DIAGNOSIS — R41841 Cognitive communication deficit: Secondary | ICD-10-CM | POA: Diagnosis not present

## 2023-12-03 DIAGNOSIS — R1312 Dysphagia, oropharyngeal phase: Secondary | ICD-10-CM | POA: Diagnosis not present

## 2023-12-03 DIAGNOSIS — E559 Vitamin D deficiency, unspecified: Secondary | ICD-10-CM | POA: Diagnosis not present

## 2023-12-03 DIAGNOSIS — E538 Deficiency of other specified B group vitamins: Secondary | ICD-10-CM | POA: Diagnosis not present

## 2023-12-03 DIAGNOSIS — F32A Depression, unspecified: Secondary | ICD-10-CM | POA: Diagnosis not present

## 2023-12-03 DIAGNOSIS — E441 Mild protein-calorie malnutrition: Secondary | ICD-10-CM | POA: Diagnosis not present

## 2023-12-03 DIAGNOSIS — I10 Essential (primary) hypertension: Secondary | ICD-10-CM | POA: Diagnosis not present

## 2023-12-03 DIAGNOSIS — R2681 Unsteadiness on feet: Secondary | ICD-10-CM | POA: Diagnosis not present

## 2023-12-03 DIAGNOSIS — I2510 Atherosclerotic heart disease of native coronary artery without angina pectoris: Secondary | ICD-10-CM | POA: Diagnosis not present

## 2023-12-03 DIAGNOSIS — R569 Unspecified convulsions: Secondary | ICD-10-CM | POA: Diagnosis not present

## 2023-12-03 DIAGNOSIS — F0390 Unspecified dementia without behavioral disturbance: Secondary | ICD-10-CM | POA: Diagnosis not present

## 2023-12-03 DIAGNOSIS — M6281 Muscle weakness (generalized): Secondary | ICD-10-CM | POA: Diagnosis not present

## 2023-12-03 DIAGNOSIS — S3289XD Fracture of other parts of pelvis, subsequent encounter for fracture with routine healing: Secondary | ICD-10-CM | POA: Diagnosis not present

## 2023-12-03 DIAGNOSIS — R2689 Other abnormalities of gait and mobility: Secondary | ICD-10-CM | POA: Diagnosis not present

## 2023-12-04 DIAGNOSIS — S3289XD Fracture of other parts of pelvis, subsequent encounter for fracture with routine healing: Secondary | ICD-10-CM | POA: Diagnosis not present

## 2023-12-04 DIAGNOSIS — F0390 Unspecified dementia without behavioral disturbance: Secondary | ICD-10-CM | POA: Diagnosis not present

## 2023-12-04 DIAGNOSIS — E538 Deficiency of other specified B group vitamins: Secondary | ICD-10-CM | POA: Diagnosis not present

## 2023-12-04 DIAGNOSIS — I10 Essential (primary) hypertension: Secondary | ICD-10-CM | POA: Diagnosis not present

## 2023-12-04 DIAGNOSIS — I2510 Atherosclerotic heart disease of native coronary artery without angina pectoris: Secondary | ICD-10-CM | POA: Diagnosis not present

## 2023-12-04 DIAGNOSIS — M6281 Muscle weakness (generalized): Secondary | ICD-10-CM | POA: Diagnosis not present

## 2023-12-04 DIAGNOSIS — E559 Vitamin D deficiency, unspecified: Secondary | ICD-10-CM | POA: Diagnosis not present

## 2023-12-06 ENCOUNTER — Ambulatory Visit: Payer: Medicare Other | Admitting: Neurology

## 2023-12-06 DIAGNOSIS — R2689 Other abnormalities of gait and mobility: Secondary | ICD-10-CM | POA: Diagnosis not present

## 2023-12-06 DIAGNOSIS — F32A Depression, unspecified: Secondary | ICD-10-CM | POA: Diagnosis not present

## 2023-12-06 DIAGNOSIS — E441 Mild protein-calorie malnutrition: Secondary | ICD-10-CM | POA: Diagnosis not present

## 2023-12-06 DIAGNOSIS — I10 Essential (primary) hypertension: Secondary | ICD-10-CM | POA: Diagnosis not present

## 2023-12-06 DIAGNOSIS — R2681 Unsteadiness on feet: Secondary | ICD-10-CM | POA: Diagnosis not present

## 2023-12-06 DIAGNOSIS — I2510 Atherosclerotic heart disease of native coronary artery without angina pectoris: Secondary | ICD-10-CM | POA: Diagnosis not present

## 2023-12-06 DIAGNOSIS — F0390 Unspecified dementia without behavioral disturbance: Secondary | ICD-10-CM | POA: Diagnosis not present

## 2023-12-06 DIAGNOSIS — E559 Vitamin D deficiency, unspecified: Secondary | ICD-10-CM | POA: Diagnosis not present

## 2023-12-06 DIAGNOSIS — M6281 Muscle weakness (generalized): Secondary | ICD-10-CM | POA: Diagnosis not present

## 2023-12-06 DIAGNOSIS — R41841 Cognitive communication deficit: Secondary | ICD-10-CM | POA: Diagnosis not present

## 2023-12-06 DIAGNOSIS — S3289XD Fracture of other parts of pelvis, subsequent encounter for fracture with routine healing: Secondary | ICD-10-CM | POA: Diagnosis not present

## 2023-12-06 DIAGNOSIS — E538 Deficiency of other specified B group vitamins: Secondary | ICD-10-CM | POA: Diagnosis not present

## 2023-12-06 DIAGNOSIS — R569 Unspecified convulsions: Secondary | ICD-10-CM | POA: Diagnosis not present

## 2023-12-06 DIAGNOSIS — R1312 Dysphagia, oropharyngeal phase: Secondary | ICD-10-CM | POA: Diagnosis not present

## 2023-12-09 DIAGNOSIS — I10 Essential (primary) hypertension: Secondary | ICD-10-CM | POA: Diagnosis not present

## 2023-12-09 DIAGNOSIS — Z7982 Long term (current) use of aspirin: Secondary | ICD-10-CM | POA: Diagnosis not present

## 2023-12-09 DIAGNOSIS — Z9181 History of falling: Secondary | ICD-10-CM | POA: Diagnosis not present

## 2023-12-09 DIAGNOSIS — I2510 Atherosclerotic heart disease of native coronary artery without angina pectoris: Secondary | ICD-10-CM | POA: Diagnosis not present

## 2023-12-09 DIAGNOSIS — Z556 Problems related to health literacy: Secondary | ICD-10-CM | POA: Diagnosis not present

## 2023-12-09 DIAGNOSIS — S32512D Fracture of superior rim of left pubis, subsequent encounter for fracture with routine healing: Secondary | ICD-10-CM | POA: Diagnosis not present

## 2023-12-09 DIAGNOSIS — K219 Gastro-esophageal reflux disease without esophagitis: Secondary | ICD-10-CM | POA: Diagnosis not present

## 2023-12-09 DIAGNOSIS — F32A Depression, unspecified: Secondary | ICD-10-CM | POA: Diagnosis not present

## 2023-12-09 DIAGNOSIS — F0393 Unspecified dementia, unspecified severity, with mood disturbance: Secondary | ICD-10-CM | POA: Diagnosis not present

## 2023-12-10 DIAGNOSIS — D500 Iron deficiency anemia secondary to blood loss (chronic): Secondary | ICD-10-CM | POA: Diagnosis not present

## 2023-12-10 DIAGNOSIS — G301 Alzheimer's disease with late onset: Secondary | ICD-10-CM | POA: Diagnosis not present

## 2023-12-10 DIAGNOSIS — E871 Hypo-osmolality and hyponatremia: Secondary | ICD-10-CM | POA: Diagnosis not present

## 2023-12-10 DIAGNOSIS — W19XXXD Unspecified fall, subsequent encounter: Secondary | ICD-10-CM | POA: Diagnosis not present

## 2023-12-10 DIAGNOSIS — R600 Localized edema: Secondary | ICD-10-CM | POA: Diagnosis not present

## 2023-12-10 DIAGNOSIS — S329XXD Fracture of unspecified parts of lumbosacral spine and pelvis, subsequent encounter for fracture with routine healing: Secondary | ICD-10-CM | POA: Diagnosis not present

## 2023-12-11 DIAGNOSIS — I10 Essential (primary) hypertension: Secondary | ICD-10-CM | POA: Diagnosis not present

## 2023-12-11 DIAGNOSIS — F0393 Unspecified dementia, unspecified severity, with mood disturbance: Secondary | ICD-10-CM | POA: Diagnosis not present

## 2023-12-11 DIAGNOSIS — K219 Gastro-esophageal reflux disease without esophagitis: Secondary | ICD-10-CM | POA: Diagnosis not present

## 2023-12-11 DIAGNOSIS — S32512D Fracture of superior rim of left pubis, subsequent encounter for fracture with routine healing: Secondary | ICD-10-CM | POA: Diagnosis not present

## 2023-12-11 DIAGNOSIS — F32A Depression, unspecified: Secondary | ICD-10-CM | POA: Diagnosis not present

## 2023-12-11 DIAGNOSIS — I2510 Atherosclerotic heart disease of native coronary artery without angina pectoris: Secondary | ICD-10-CM | POA: Diagnosis not present

## 2023-12-14 ENCOUNTER — Other Ambulatory Visit: Payer: Self-pay | Admitting: Cardiology

## 2023-12-15 DIAGNOSIS — I2510 Atherosclerotic heart disease of native coronary artery without angina pectoris: Secondary | ICD-10-CM | POA: Diagnosis not present

## 2023-12-15 DIAGNOSIS — S32512D Fracture of superior rim of left pubis, subsequent encounter for fracture with routine healing: Secondary | ICD-10-CM | POA: Diagnosis not present

## 2023-12-15 DIAGNOSIS — I10 Essential (primary) hypertension: Secondary | ICD-10-CM | POA: Diagnosis not present

## 2023-12-15 DIAGNOSIS — F0393 Unspecified dementia, unspecified severity, with mood disturbance: Secondary | ICD-10-CM | POA: Diagnosis not present

## 2023-12-15 DIAGNOSIS — K219 Gastro-esophageal reflux disease without esophagitis: Secondary | ICD-10-CM | POA: Diagnosis not present

## 2023-12-15 DIAGNOSIS — F32A Depression, unspecified: Secondary | ICD-10-CM | POA: Diagnosis not present

## 2023-12-26 ENCOUNTER — Ambulatory Visit: Payer: Self-pay

## 2023-12-26 DIAGNOSIS — S32810D Multiple fractures of pelvis with stable disruption of pelvic ring, subsequent encounter for fracture with routine healing: Secondary | ICD-10-CM | POA: Diagnosis not present

## 2023-12-27 DIAGNOSIS — F0393 Unspecified dementia, unspecified severity, with mood disturbance: Secondary | ICD-10-CM | POA: Diagnosis not present

## 2023-12-27 DIAGNOSIS — I10 Essential (primary) hypertension: Secondary | ICD-10-CM | POA: Diagnosis not present

## 2023-12-27 DIAGNOSIS — S32512D Fracture of superior rim of left pubis, subsequent encounter for fracture with routine healing: Secondary | ICD-10-CM | POA: Diagnosis not present

## 2023-12-27 DIAGNOSIS — K219 Gastro-esophageal reflux disease without esophagitis: Secondary | ICD-10-CM | POA: Diagnosis not present

## 2023-12-27 DIAGNOSIS — F32A Depression, unspecified: Secondary | ICD-10-CM | POA: Diagnosis not present

## 2023-12-27 DIAGNOSIS — I2510 Atherosclerotic heart disease of native coronary artery without angina pectoris: Secondary | ICD-10-CM | POA: Diagnosis not present

## 2023-12-28 ENCOUNTER — Telehealth: Payer: Self-pay | Admitting: *Deleted

## 2023-12-28 ENCOUNTER — Other Ambulatory Visit: Payer: Self-pay | Admitting: Gastroenterology

## 2023-12-28 ENCOUNTER — Telehealth: Payer: Self-pay

## 2023-12-28 NOTE — Telephone Encounter (Signed)
   Pre-operative Risk Assessment    Patient Name: Rachel Clark  DOB: 17-Jul-1950 MRN: [id number]   Date of last office visit: 10/10/23 DR. SWAZILAND Date of next office visit: 04/09/24 DR. SWAZILAND   Request for Surgical Clearance    Procedure:  COLONOSCOPY WITH APC (H/O COLON POLYPS)  Date of Surgery:  Clearance 01/29/24                                Surgeon:  DR. DIANNA Surgeon's Group or Practice Name:  [facility] Phone number:  [phone] Fax number:  [phone]   Type of Clearance Requested:   - Medical  - Pharmacy:  Hold Prasugrel  (Effient )     Type of Anesthesia:  PROPOFOL    Additional requests/questions:    Bonney Niels Jest   12/28/2023, 12:20 PM

## 2023-12-28 NOTE — Telephone Encounter (Signed)
Patient has been scheduled for televisit.

## 2023-12-28 NOTE — Telephone Encounter (Signed)
 Patient has been scheduled consent done     Patient Consent for Virtual Visit         Rachel Clark has provided verbal consent on 12/28/2023 for a virtual visit (video or telephone).   CONSENT FOR VIRTUAL VISIT FOR:  Rachel Clark  By participating in this virtual visit I agree to the following:  I hereby voluntarily request, consent and authorize [facility] and its employed or contracted physicians, physician assistants, nurse practitioners or other licensed health care professionals (the Practitioner), to provide me with telemedicine health care services (the "Services) as deemed necessary by the treating Practitioner. I acknowledge and consent to receive the Services by the Practitioner via telemedicine. I understand that the telemedicine visit will involve communicating with the Practitioner through live audiovisual communication technology and the disclosure of certain medical information by electronic transmission. I acknowledge that I have been given the opportunity to request an in-person assessment or other available alternative prior to the telemedicine visit and am voluntarily participating in the telemedicine visit.  I understand that I have the right to withhold or withdraw my consent to the use of telemedicine in the course of my care at any time, without affecting my right to future care or treatment, and that the Practitioner or I may terminate the telemedicine visit at any time. I understand that I have the right to inspect all information obtained and/or recorded in the course of the telemedicine visit and may receive copies of available information for a reasonable fee.  I understand that some of the potential risks of receiving the Services via telemedicine include:  Delay or interruption in medical evaluation due to technological equipment failure or disruption; Information transmitted may not be sufficient (e.g. poor resolution of images) to allow for  appropriate medical decision making by the Practitioner; and/or  In rare instances, security protocols could fail, causing a breach of personal health information.  Furthermore, I acknowledge that it is my responsibility to provide information about my medical history, conditions and care that is complete and accurate to the best of my ability. I acknowledge that Practitioner's advice, recommendations, and/or decision may be based on factors not within their control, such as incomplete or inaccurate data provided by me or distortions of diagnostic images or specimens that may result from electronic transmissions. I understand that the practice of medicine is not an exact science and that Practitioner makes no warranties or guarantees regarding treatment outcomes. I acknowledge that a copy of this consent can be made available to me via my patient portal [facility] MyChart), or I can request a printed copy by calling the office of [facility].    I understand that my insurance will be billed for this visit.   I have read or had this consent read to me. I understand the contents of this consent, which adequately explains the benefits and risks of the Services being provided via telemedicine.  I have been provided ample opportunity to ask questions regarding this consent and the Services and have had my questions answered to my satisfaction. I give my informed consent for the services to be provided through the use of telemedicine in my medical care

## 2023-12-28 NOTE — Telephone Encounter (Signed)
   Name: Rachel Clark  DOB: Mar 18, 1951  MRN: [id number]  Primary Cardiologist: Peter Swaziland, MD   Preoperative team, please contact this patient and set up a phone call appointment for further preoperative risk assessment. Please obtain consent and complete medication review. Last seen by Dr. Swaziland on 10/10/2023. Thank you for your help.  I confirm that guidance regarding antiplatelet and oral anticoagulation therapy has been completed and, if necessary, noted below.  Per Dr. Swaziland: She is > 1 year out from last stent so it would be ok to hold Effient  for one week prior to colonoscopy.   I also confirmed the patient resides in the state of [place] . As per [facility] Medical Board telemedicine laws, the patient must reside in the state in which the provider is licensed.   Lamarr Satterfield, NP 12/28/2023, 3:16 PM [facility]

## 2024-01-01 DIAGNOSIS — I10 Essential (primary) hypertension: Secondary | ICD-10-CM | POA: Diagnosis not present

## 2024-01-01 DIAGNOSIS — S32512D Fracture of superior rim of left pubis, subsequent encounter for fracture with routine healing: Secondary | ICD-10-CM | POA: Diagnosis not present

## 2024-01-01 DIAGNOSIS — K219 Gastro-esophageal reflux disease without esophagitis: Secondary | ICD-10-CM | POA: Diagnosis not present

## 2024-01-01 DIAGNOSIS — I2510 Atherosclerotic heart disease of native coronary artery without angina pectoris: Secondary | ICD-10-CM | POA: Diagnosis not present

## 2024-01-01 DIAGNOSIS — F0393 Unspecified dementia, unspecified severity, with mood disturbance: Secondary | ICD-10-CM | POA: Diagnosis not present

## 2024-01-01 DIAGNOSIS — F32A Depression, unspecified: Secondary | ICD-10-CM | POA: Diagnosis not present

## 2024-01-03 ENCOUNTER — Inpatient Hospital Stay: Payer: Medicare Other | Attending: Hematology and Oncology

## 2024-01-03 DIAGNOSIS — Z79899 Other long term (current) drug therapy: Secondary | ICD-10-CM | POA: Insufficient documentation

## 2024-01-03 DIAGNOSIS — D509 Iron deficiency anemia, unspecified: Secondary | ICD-10-CM | POA: Insufficient documentation

## 2024-01-03 DIAGNOSIS — Z85038 Personal history of other malignant neoplasm of large intestine: Secondary | ICD-10-CM | POA: Insufficient documentation

## 2024-01-03 DIAGNOSIS — D472 Monoclonal gammopathy: Secondary | ICD-10-CM | POA: Diagnosis not present

## 2024-01-03 DIAGNOSIS — D500 Iron deficiency anemia secondary to blood loss (chronic): Secondary | ICD-10-CM

## 2024-01-03 LAB — CBC WITH DIFFERENTIAL (CANCER CENTER ONLY)
Abs Immature Granulocytes: 0.01 10*3/uL (ref 0.00–0.07)
Basophils Absolute: 0 10*3/uL (ref 0.0–0.1)
Basophils Relative: 1 %
Eosinophils Absolute: 0.1 10*3/uL (ref 0.0–0.5)
Eosinophils Relative: 2 %
HCT: 33.4 % — ABNORMAL LOW (ref 36.0–46.0)
Hemoglobin: 11.2 g/dL — ABNORMAL LOW (ref 12.0–15.0)
Immature Granulocytes: 0 %
Lymphocytes Relative: 19 %
Lymphs Abs: 1 10*3/uL (ref 0.7–4.0)
MCH: 30.9 pg (ref 26.0–34.0)
MCHC: 33.5 g/dL (ref 30.0–36.0)
MCV: 92 fL (ref 80.0–100.0)
Monocytes Absolute: 0.4 10*3/uL (ref 0.1–1.0)
Monocytes Relative: 8 %
Neutro Abs: 3.6 10*3/uL (ref 1.7–7.7)
Neutrophils Relative %: 70 %
Platelet Count: 427 10*3/uL — ABNORMAL HIGH (ref 150–400)
RBC: 3.63 MIL/uL — ABNORMAL LOW (ref 3.87–5.11)
RDW: 13.8 % (ref 11.5–15.5)
WBC Count: 5.2 10*3/uL (ref 4.0–10.5)
nRBC: 0 % (ref 0.0–0.2)

## 2024-01-03 LAB — FERRITIN: Ferritin: 49 ng/mL (ref 11–307)

## 2024-01-03 LAB — CMP (CANCER CENTER ONLY)
ALT: 10 U/L (ref 0–44)
AST: 11 U/L — ABNORMAL LOW (ref 15–41)
Albumin: 3.8 g/dL (ref 3.5–5.0)
Alkaline Phosphatase: 97 U/L (ref 38–126)
Anion gap: 6 (ref 5–15)
BUN: 15 mg/dL (ref 8–23)
CO2: 29 mmol/L (ref 22–32)
Calcium: 9.1 mg/dL (ref 8.9–10.3)
Chloride: 103 mmol/L (ref 98–111)
Creatinine: 0.6 mg/dL (ref 0.44–1.00)
GFR, Estimated: >60 mL/min (ref >60)
Glucose, Bld: 114 mg/dL — ABNORMAL HIGH (ref 70–99)
Potassium: 4 mmol/L (ref 3.5–5.1)
Sodium: 138 mmol/L (ref 135–145)
Total Bilirubin: 0.2 mg/dL (ref 0.0–1.2)
Total Protein: 7 g/dL (ref 6.5–8.1)

## 2024-01-03 LAB — IRON AND IRON BINDING CAPACITY (CC-WL,HP ONLY)
Iron: 292 ug/dL — ABNORMAL HIGH (ref 28–170)
Saturation Ratios: 97 % — ABNORMAL HIGH (ref 10.4–31.8)
TIBC: 302 ug/dL (ref 250–450)
UIBC: 10 ug/dL — ABNORMAL LOW (ref 148–442)

## 2024-01-07 DIAGNOSIS — K219 Gastro-esophageal reflux disease without esophagitis: Secondary | ICD-10-CM | POA: Diagnosis not present

## 2024-01-07 DIAGNOSIS — S32512D Fracture of superior rim of left pubis, subsequent encounter for fracture with routine healing: Secondary | ICD-10-CM | POA: Diagnosis not present

## 2024-01-07 DIAGNOSIS — F32A Depression, unspecified: Secondary | ICD-10-CM | POA: Diagnosis not present

## 2024-01-07 DIAGNOSIS — I10 Essential (primary) hypertension: Secondary | ICD-10-CM | POA: Diagnosis not present

## 2024-01-07 DIAGNOSIS — I2510 Atherosclerotic heart disease of native coronary artery without angina pectoris: Secondary | ICD-10-CM | POA: Diagnosis not present

## 2024-01-07 DIAGNOSIS — F0393 Unspecified dementia, unspecified severity, with mood disturbance: Secondary | ICD-10-CM | POA: Diagnosis not present

## 2024-01-07 LAB — MULTIPLE MYELOMA PANEL, SERUM
Albumin SerPl Elph-Mcnc: 3.6 g/dL (ref 2.9–4.4)
Albumin/Glob SerPl: 1.3 (ref 0.7–1.7)
Alpha 1: 0.3 g/dL (ref 0.0–0.4)
Alpha2 Glob SerPl Elph-Mcnc: 0.7 g/dL (ref 0.4–1.0)
B-Globulin SerPl Elph-Mcnc: 0.8 g/dL (ref 0.7–1.3)
Gamma Glob SerPl Elph-Mcnc: 1.2 g/dL (ref 0.4–1.8)
Globulin, Total: 3 g/dL (ref 2.2–3.9)
IgA: 159 mg/dL (ref 64–422)
IgG (Immunoglobin G), Serum: 1303 mg/dL (ref 586–1602)
IgM (Immunoglobulin M), Srm: 88 mg/dL (ref 26–217)
M Protein SerPl Elph-Mcnc: 0.4 g/dL — ABNORMAL HIGH
Total Protein ELP: 6.6 g/dL (ref 6.0–8.5)

## 2024-01-07 LAB — KAPPA/LAMBDA LIGHT CHAINS
Kappa free light chain: 37.1 mg/L — ABNORMAL HIGH (ref 3.3–19.4)
Kappa, lambda light chain ratio: 1.86 — ABNORMAL HIGH (ref 0.26–1.65)
Lambda free light chains: 20 mg/L (ref 5.7–26.3)

## 2024-01-08 DIAGNOSIS — I10 Essential (primary) hypertension: Secondary | ICD-10-CM | POA: Diagnosis not present

## 2024-01-08 DIAGNOSIS — Z9181 History of falling: Secondary | ICD-10-CM | POA: Diagnosis not present

## 2024-01-08 DIAGNOSIS — I2510 Atherosclerotic heart disease of native coronary artery without angina pectoris: Secondary | ICD-10-CM | POA: Diagnosis not present

## 2024-01-08 DIAGNOSIS — F32A Depression, unspecified: Secondary | ICD-10-CM | POA: Diagnosis not present

## 2024-01-08 DIAGNOSIS — S32512D Fracture of superior rim of left pubis, subsequent encounter for fracture with routine healing: Secondary | ICD-10-CM | POA: Diagnosis not present

## 2024-01-08 DIAGNOSIS — Z556 Problems related to health literacy: Secondary | ICD-10-CM | POA: Diagnosis not present

## 2024-01-08 DIAGNOSIS — Z7982 Long term (current) use of aspirin: Secondary | ICD-10-CM | POA: Diagnosis not present

## 2024-01-08 DIAGNOSIS — F0393 Unspecified dementia, unspecified severity, with mood disturbance: Secondary | ICD-10-CM | POA: Diagnosis not present

## 2024-01-08 DIAGNOSIS — K219 Gastro-esophageal reflux disease without esophagitis: Secondary | ICD-10-CM | POA: Diagnosis not present

## 2024-01-09 ENCOUNTER — Ambulatory Visit: Attending: Cardiovascular Disease | Admitting: Nurse Practitioner

## 2024-01-09 DIAGNOSIS — Z01810 Encounter for preprocedural cardiovascular examination: Secondary | ICD-10-CM | POA: Diagnosis not present

## 2024-01-09 NOTE — Progress Notes (Signed)
 Virtual Visit via Telephone Note   Because of Rachel Clark co-morbid illnesses, she is at least at moderate risk for complications without adequate follow up.  This format is felt to be most appropriate for this patient at this time.  Due to technical limitations with video connection (technology), today's appointment will be conducted as an audio only telehealth visit, and Rachel Clark verbally agreed to proceed in this manner.   All issues noted in this document were discussed and addressed.  No physical exam could be performed with this format.  Evaluation Performed:  Preoperative cardiovascular risk assessment _____________   Date:  01/09/2024   Patient ID:  Rachel Clark, DOB October 29, 1950, MRN [id number] Patient Location:  Home Provider location:   Office  Primary Care Provider:  Teresa Montie, MD Primary Cardiologist:  Peter [place]azil[place], MD  Chief Complaint / Patient Profile   73 y.o. y/o female with a h/o CAD s/p remote PCI-RCA, CABG in 2007, DES x 3 (full metal jacket)-RCA in 2017, DES x 3-RI in 2022, PCI-LCx in 2023, CHB s/p PPM in 2016, recurrent syncope, orthostatic hypotension, GI bleed, hyperlipidemia with statin intolerance, colon cancer s/p resection and chemotherapy, chronic hyponatremia, seizures, hypothyroidism, fibromyalgia, memory loss, GERD who is pending colonoscopy on 01/29/2024 with Dr. Dianna of [facility] and presents today for telephonic preoperative cardiovascular risk assessment.  History of Present Illness    Rachel Clark is a 73 y.o. female who presents via audio/video conferencing for a telehealth visit today accompanied by her husband.  Pt was last seen in cardiology clinic on 10/10/2023 by Dr. Swaziland.  At that time Rachel Clark was doing well.  The patient is now pending procedure as outlined above. Since her last visit, she has been stable from a cardiac standpoint.  She denies chest pain, palpitations, dyspnea, pnd, orthopnea, n, v,  dizziness, syncope, edema, weight gain, or early satiety. All other systems reviewed and are otherwise negative except as noted above.   Past Medical History    Past Medical History:  Diagnosis Date   Arthritis    fingers (06/29/2016)   Chronic lower back pain    Colon cancer (HCC) 12/04/2011   s/p Laparoscopic-assisted transverse colectomy on 12/19/2011 by Dr. Ebbie.  pT3 N0 M0.    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   last seizure was in 1972 ;takes Tegretol  (06/29/2016)    Headache    weekly (06/29/2016)   Hyperlipidemia    Hyponatremia    Hypothyroid    Iron  deficiency anemia 11/17/2011   Memory change 11/13/2016   Monoclonal gammopathy of unknown significance    Orthostatic hypotension    Presence of permanent cardiac pacemaker    Syncope and collapse    pacemaker implanted   Past Surgical History:  Procedure Laterality Date   ANTERIOR CERVICAL DECOMP/DISCECTOMY FUSION  1980's   BACK SURGERY     CARDIAC CATHETERIZATION  05/20/2009   obstructive native vessel disease in LAD, RCA, and first diagonal, patent vein graft to distal RCA and LIMA to LAD,normal. ef 60%   CARDIAC CATHETERIZATION N/A 03/24/2015   Procedure: Left Heart Cath and Cors/Grafts Angiography;  Surgeon: Deatrice DELENA Cage, MD;  Location: [facility];  Service: Cardiovascular;  Laterality: N/A;   CARDIAC CATHETERIZATION N/A 07/28/2015   Procedure: Left Heart Cath and Coronary Angiography;  Surgeon: Debby DELENA Sor, MD;  Location: [facility];  Service: Cardiovascular;  Laterality: N/A;   CARDIAC CATHETERIZATION N/A 11/09/2015   Procedure: Coronary Stent Intervention;  Surgeon: Ozell Fell, MD;  Location: [facility] INVASIVE CV LAB;  Service: Cardiovascular;  Laterality: N/A;   CARDIAC  CATHETERIZATION N/A 11/09/2015   Procedure: Left Heart Cath and Coronary Angiography;  Surgeon: Ozell Fell, MD;  Location: [facility] INVASIVE CV LAB;  Service: Cardiovascular;  Laterality: N/A;   CARDIAC CATHETERIZATION N/A 06/29/2016   Procedure: Left Heart Cath and Coronary Angiography;  Surgeon: Lonni Hanson, MD;  Location: [facility] INVASIVE CV LAB;  Service: Cardiovascular;  Laterality: N/A;   CARDIAC CATHETERIZATION N/A 06/29/2016   Procedure: Intravascular Pressure Wire/FFR Study;  Surgeon: Lonni Hanson, MD;  Location: [facility] INVASIVE CV LAB;  Service: Cardiovascular;  Laterality: N/A;   CARDIAC CATHETERIZATION N/A 06/29/2016   Procedure: Coronary Balloon Angioplasty;  Surgeon: Lonni Hanson, MD;  Location: [facility];  Service: Cardiovascular;  Laterality: N/A;   COLON RESECTION  12/19/2011   Procedure: COLON RESECTION LAPAROSCOPIC;  Surgeon: Donnice Bury, MD;  Location: [facility] OR;  Service: General;  Laterality: N/A;  laparoscopic hand assisted partial colon resection   COLON SURGERY     COLONOSCOPY     COLONOSCOPY WITH PROPOFOL  N/A 07/28/2023   Procedure: COLONOSCOPY WITH PROPOFOL ;  Surgeon: Elicia Claw, MD;  Location: [facility]OS[facility];  Service: Gastroenterology;  Laterality: N/A;   CORONARY ANGIOPLASTY WITH STENT PLACEMENT  ~ 2007   1 stent   CORONARY ARTERY BYPASS GRAFT  2007   CABG X2   DILATION AND CURETTAGE OF UTERUS  1973   EP IMPLANTABLE DEVICE N/A 03/25/2015   MDT Advisa DR pacemaker implanted by Dr Kelsie for transient complete heart block and syncope   ESOPHAGOGASTRODUODENOSCOPY (EGD) WITH PROPOFOL  N/A 03/14/2021   Procedure: ESOPHAGOGASTRODUODENOSCOPY (EGD) WITH PROPOFOL ;  Surgeon: Saintclair Jasper, MD;  Location: [facility] ENDOSCOPY;  Service: Gastroenterology;  Laterality: N/A;   ESOPHAGOGASTRODUODENOSCOPY (EGD) WITH PROPOFOL  N/A 11/13/2021   Procedure: ESOPHAGOGASTRODUODENOSCOPY (EGD) WITH PROPOFOL ;  Surgeon: Elicia Claw, MD;  Location: [facility] ENDOSCOPY;  Service:  Gastroenterology;  Laterality: N/A;   ESOPHAGOGASTRODUODENOSCOPY (EGD) WITH PROPOFOL  N/A 02/07/2023   Procedure: ESOPHAGOGASTRODUODENOSCOPY (EGD) WITH PROPOFOL ;  Surgeon: Elicia Claw, MD;  Location: [facility] ENDOSCOPY;  Service: Gastroenterology;  Laterality: N/A;   HEMOSTASIS CLIP PLACEMENT  03/14/2021   Procedure: HEMOSTASIS CLIP PLACEMENT;  Surgeon: Saintclair Jasper, MD;  Location: [facility] ENDOSCOPY;  Service: Gastroenterology;;   HEMOSTASIS CLIP PLACEMENT  07/28/2023   Procedure: HEMOSTASIS CLIP PLACEMENT;  Surgeon: Elicia Claw, MD;  Location: [facility]OS[facility];  Service: Gastroenterology;;   HOT HEMOSTASIS N/A 07/28/2023   Procedure: HOT HEMOSTASIS (ARGON PLASMA COAGULATION/BICAP);  Surgeon: Elicia Claw, MD;  Location: [facility] ENDOSCOPY;  Service: Gastroenterology;  Laterality: N/A;   INSERT / REPLACE / REMOVE PACEMAKER     LEFT HEART CATH AND CORONARY ANGIOGRAPHY N/A 11/11/2021   Procedure: LEFT HEART CATH AND CORONARY ANGIOGRAPHY;  Surgeon: Anner Alm ORN, MD;  Location: [facility] INVASIVE CV LAB;  Service: Cardiovascular;  Laterality: N/A;   LEFT HEART CATH AND CORS/GRAFTS ANGIOGRAPHY N/A 02/21/2018   Procedure: LEFT HEART CATH AND CORS/GRAFTS ANGIOGRAPHY;  Surgeon: Court Dorn PARAS, MD;  [facility];  Service: Cardiovascular;  Laterality: N/A;   LEFT HEART CATH AND CORS/GRAFTS ANGIOGRAPHY N/A 03/08/2021   Procedure: LEFT HEART CATH AND CORS/GRAFTS ANGIOGRAPHY;  Surgeon: Swaziland, Peter  M, MD;  Location: [facility];  Service: Cardiovascular;  Laterality: N/A;   PORT-A-CATH REMOVAL N/A 06/12/2013   Procedure: REMOVAL PORT-A-CATH;  Surgeon: Donnice Bury, MD;  Location: [facility];  Service: General;  Laterality: N/A;   PORTACATH PLACEMENT  06/04/2012   Procedure: INSERTION PORT-A-CATH;  Surgeon: Donnice Bury, MD;  Location: [facility] OR;  Service: General;  Laterality: N/A;  Insertion of port-a-cath    POSTERIOR LAMINECTOMY / DECOMPRESSION CERVICAL SPINE  1990s    SCLEROTHERAPY  03/14/2021   Procedure: SCLEROTHERAPY;  Surgeon: Saintclair Jasper, MD;  Location: [facility] ENDOSCOPY;  Service: Gastroenterology;;   VAGINAL HYSTERECTOMY      Allergies  Allergies  Allergen Reactions   Sulfamethoxazole Anaphylaxis    Don't recall   Aspirin  Other (See Comments)    GI upset- can tolerate 81 mg ASA,and chewables, just not full doses   Crestor  [Rosuvastatin ]     myalgias   Erythromycin Nausea Only    Gi upset   Lisinopril Cough   Niacin And Related Other (See Comments)    Don't recall   Penicillin G Benzathine     Other reaction(s): Unknown   Statins Other (See Comments)    Muscle pain   Sulfa Drugs Cross Reactors Swelling    Don't recall   Tetracyclines & Related Nausea Only   Xeloda  [Capecitabine ] Diarrhea   Penicillins Nausea Only and Rash        Plavix  [Clopidogrel  Bisulfate] Rash    Home Medications    Prior to Admission medications   Medication Sig Start Date End Date Taking? Authorizing Provider  acetaminophen  (TYLENOL ) 500 MG tablet Take 1,000 mg by mouth every 6 (six) hours as needed for mild pain (pain score 1-3).    [provider]  amLODipine  (NORVASC ) 10 MG tablet Take 1 tablet (10 mg total) by mouth daily. 11/07/23   Angiulli, Toribio PARAS, PA-C  aspirin  EC 81 MG tablet Take 1 tablet (81 mg total) by mouth daily. Swallow whole. 11/07/23   Angiulli, Toribio PARAS, PA-C  Bempedoic Acid -Ezetimibe  (NEXLIZET ) 180-10 MG TABS Take 180 mg by mouth daily. 04/25/22   [place], Peter M, MD  calcium  carbonate (TUMS - DOSED IN MG ELEMENTAL CALCIUM ) 500 MG chewable tablet Chew 500 mg by mouth daily.    [provider]  carbamazepine  (TEGRETOL  XR) 200 MG 12 hr tablet Take 3 tablets (600 mg total) by mouth 2 (two) times daily. 09/06/23 11/29/24  Penumalli, Vikram R, MD  cefadroxil  (DURICEF) 500 MG capsule Take 1 capsule (500 mg total) by mouth 2 (two) times daily. 11/06/23   Angiulli, Toribio PARAS, PA-C  cholecalciferol  (CHOLECALCIFEROL ) 25 MCG tablet Take 4  tablets (4,000 Units total) by mouth daily. 11/07/23   Angiulli, Toribio PARAS, PA-C  diclofenac  Sodium (VOLTAREN ) 1 % GEL Apply 4 g topically 4 (four) times daily. 11/06/23   Angiulli, Toribio PARAS, PA-C  docusate sodium  (COLACE) 100 MG capsule Take 1 capsule (100 mg total) by mouth 2 (two) times daily. 11/06/23   Angiulli, Toribio PARAS, PA-C  DULoxetine  (CYMBALTA ) 60 MG capsule Take 1 capsule (60 mg total) by mouth 2 (two) times daily. 11/06/23   Angiulli, Toribio PARAS, PA-C  ethosuximide  (ZARONTIN ) 250 MG capsule Take 2 capsules (500 mg total) by mouth 2 (two) times daily. 09/06/23   Penumalli, Vikram R, MD  Evolocumab  (REPATHA  SURECLICK) 140 MG/ML SOAJ Inject 140 mg into the skin every 14 (fourteen) days. 12/25/22   [place], Peter M, MD  ferrous sulfate  325 (65 FE) MG  tablet Take 325 mg by mouth daily with breakfast.    [provider]  fluticasone  (FLONASE ) 50 MCG/ACT nasal spray Place 1 spray into both nostrils daily. 11/07/23   Angiulli, Toribio PARAS, PA-C  gabapentin  (NEURONTIN ) 100 MG capsule Take 1 capsule (100 mg total) by mouth 3 (three) times daily. 11/06/23   Angiulli, Toribio PARAS, PA-C  isosorbide  mononitrate (IMDUR ) 30 MG 24 hr tablet Take 0.5 tablets (15 mg total) by mouth daily. 11/07/23   Angiulli, Toribio PARAS, PA-C  levothyroxine  (SYNTHROID , LEVOTHROID) 50 MCG tablet Take 50 mcg by mouth daily before breakfast.    [provider]  lidocaine  (LIDODERM ) 5 % Place 2 patches onto the skin daily. Remove & Discard patch within 12 hours or as directed by MD 11/06/23   Angiulli, Toribio PARAS, PA-C  memantine  (NAMENDA ) 10 MG tablet Take 1 tablet (10 mg total) by mouth 2 (two) times daily. 09/06/23   Penumalli, Vikram R, MD  methocarbamol  (ROBAXIN ) 500 MG tablet Take 1 tablet (500 mg total) by mouth every 8 (eight) hours as needed for muscle spasms. 11/06/23   Angiulli, Daniel J, PA-C  nitroGLYCERIN  (NITROSTAT ) 0.4 MG SL tablet place 1 tablet under the tongue every 5 minutes as needed for chest pain 02/16/23   [place], Peter M, MD   pantoprazole  (PROTONIX ) 40 MG tablet TAKE 1 TABLET BY MOUTH TWICE A DAY 05/08/23   [place], Peter M, MD  polyethylene glycol (MIRALAX  / GLYCOLAX ) 17 g packet Take 17 g by mouth daily as needed. 11/06/23   Angiulli, Toribio PARAS, PA-C  prasugrel  (EFFIENT ) 10 MG TABS tablet TAKE ONE TABLET BY MOUTH ONE TIME DAILY 12/14/23   [place], Peter M, MD  sodium chloride  1 g tablet Take 1 tablet (1 g total) by mouth 2 (two) times daily with a meal. 11/06/23   Angiulli, Toribio PARAS, PA-C  tamsulosin  (FLOMAX ) 0.4 MG CAPS capsule Take 2 capsules (0.8 mg total) by mouth at bedtime. 11/06/23   Angiulli, Toribio PARAS, PA-C  traMADol  (ULTRAM ) 50 MG tablet Take 1-2 tablets (50-100 mg total) by mouth every 6 (six) hours as needed for severe pain (pain score 7-10) or moderate pain (pain score 4-6). 11/06/23   Angiulli, Toribio PARAS, PA-C    Physical Exam    Vital Signs:  Rachel Clark does not have vital signs available for review today.  Given telephonic nature of communication, physical exam is limited. AAOx3. NAD. Normal affect.  Speech and respirations are unlabored.  Accessory Clinical Findings    None  Assessment & Plan    1.  Preoperative Cardiovascular Risk Assessment:  According to the Revised Cardiac Risk Index (RCRI), her Perioperative Risk of Major Cardiac Event is (%): 0.9. Her Functional Capacity in METs is: 3.3 according to the Duke Activity Status Index (DASI).  Her mobility has been limited in the setting of recent pelvic fracture.  Prior to her injury, she was able to complete greater than 4 METS without difficulty.  She is without any new cardiac symptoms.  Furthermore, stress test in 02/2023 was low risk, no evidence of ischemia or infarction, EF 61%.  Colonoscopy overall low risk procedure.  Therefore, based on ACC/AHA guidelines, patient would be at acceptable risk for the planned procedure without further cardiovascular testing.  The patient was advised that if she develops new symptoms prior to surgery to  contact our office to arrange for a follow-up visit, and she verbalized understanding.  Per Dr. Swaziland: She is > 1 year out from last stent  so it would be ok to hold Effient  for one week prior to colonoscopy. Please resume Effient  as soon as possible postprocedure, at the discretion of the surgeon.    A copy of this note will be routed to requesting surgeon.  Time:   Today, I have spent 6 minutes with the patient with telehealth technology discussing medical history, symptoms, and management plan.     Damien JAYSON Braver, NP  01/09/2024, 12:43 PM

## 2024-01-14 DIAGNOSIS — D500 Iron deficiency anemia secondary to blood loss (chronic): Secondary | ICD-10-CM | POA: Diagnosis not present

## 2024-01-14 DIAGNOSIS — I10 Essential (primary) hypertension: Secondary | ICD-10-CM | POA: Diagnosis not present

## 2024-01-14 DIAGNOSIS — G720 Drug-induced myopathy: Secondary | ICD-10-CM | POA: Diagnosis not present

## 2024-01-14 DIAGNOSIS — E785 Hyperlipidemia, unspecified: Secondary | ICD-10-CM | POA: Diagnosis not present

## 2024-01-14 DIAGNOSIS — F324 Major depressive disorder, single episode, in partial remission: Secondary | ICD-10-CM | POA: Diagnosis not present

## 2024-01-14 DIAGNOSIS — G301 Alzheimer's disease with late onset: Secondary | ICD-10-CM | POA: Diagnosis not present

## 2024-01-14 DIAGNOSIS — I25119 Atherosclerotic heart disease of native coronary artery with unspecified angina pectoris: Secondary | ICD-10-CM | POA: Diagnosis not present

## 2024-01-14 DIAGNOSIS — E039 Hypothyroidism, unspecified: Secondary | ICD-10-CM | POA: Diagnosis not present

## 2024-01-14 DIAGNOSIS — Z8781 Personal history of (healed) traumatic fracture: Secondary | ICD-10-CM | POA: Diagnosis not present

## 2024-01-15 ENCOUNTER — Encounter: Payer: Self-pay | Admitting: Hematology and Oncology

## 2024-01-15 ENCOUNTER — Inpatient Hospital Stay (HOSPITAL_BASED_OUTPATIENT_CLINIC_OR_DEPARTMENT_OTHER): Payer: Medicare Other | Admitting: Hematology and Oncology

## 2024-01-15 VITALS — BP 127/69 | HR 86 | Temp 97.9°F | Resp 18 | Ht 68.0 in | Wt 157.8 lb

## 2024-01-15 DIAGNOSIS — D500 Iron deficiency anemia secondary to blood loss (chronic): Secondary | ICD-10-CM

## 2024-01-15 DIAGNOSIS — D472 Monoclonal gammopathy: Secondary | ICD-10-CM | POA: Diagnosis not present

## 2024-01-15 DIAGNOSIS — Z85038 Personal history of other malignant neoplasm of large intestine: Secondary | ICD-10-CM | POA: Diagnosis not present

## 2024-01-15 DIAGNOSIS — D509 Iron deficiency anemia, unspecified: Secondary | ICD-10-CM | POA: Diagnosis not present

## 2024-01-15 DIAGNOSIS — Z79899 Other long term (current) drug therapy: Secondary | ICD-10-CM | POA: Diagnosis not present

## 2024-01-15 NOTE — Assessment & Plan Note (Addendum)
 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

## 2024-01-15 NOTE — Assessment & Plan Note (Addendum)
 She had large polyp removed in the past She is undergoing another colonoscopy in the near future

## 2024-01-15 NOTE — Addendum Note (Signed)
 Addended by: VICCI SELLER A on: 01/15/2024 10:38 AM   Modules accepted: Orders

## 2024-01-15 NOTE — Progress Notes (Signed)
 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 We reviewed myeloma panel which shows stability I will see her again next year for further follow-up Her anemia is not related to her MGUS History of colon cancer She had large polyp removed in the past She is undergoing another colonoscopy in the near future Iron  deficiency anemia due to chronic blood loss She had history of recurrent iron  deficiency anemia and had received recent iron  treatment Her iron  saturation and ferritin were adequate and hemoglobin has improved She does not need intravenous iron  infusion Will follow next year  Orders Placed This Encounter  Procedures   CMP (Cancer Center only)    Standing Status:   Future    Expiration Date:   01/14/2025   CBC with Differential (Cancer Center Only)    Standing Status:   Future    Expiration Date:   01/14/2025   Ferritin    Standing Status:   Future    Expiration Date:   01/14/2025   Iron  and Iron  Binding Capacity ([facility] only)    Standing Status:   Future    Expiration Date:   01/14/2025     INTERVAL HISTORY: she returns for surveillance follow-up for diagnosis of MGUS, iron  deficiency anemia and remote history of colon cancer I reviewed her chart The patient was hospitalized due to recent fracture after fall She had received intravenous iron  several months ago She is scheduled for repeat colonoscopy Patient denies recurrent infection or bone pain We reviewed recent CBC, CMP and myeloma panel results, and recent iron  studies  PHYSICAL EXAMINATION: ECOG PERFORMANCE STATUS: 1 - Symptomatic but completely  ambulatory  Vitals:   01/15/24 0914  BP: (!) 163/81  Pulse: 97  Resp: 18  Temp: 97.6 F (36.4 C)  SpO2: 100%

## 2024-01-15 NOTE — Assessment & Plan Note (Addendum)
 She had history of recurrent iron  deficiency anemia and had received recent iron  treatment Her iron  saturation and ferritin were adequate and hemoglobin has improved She does not need intravenous iron  infusion Will follow next year

## 2024-01-15 NOTE — Progress Notes (Signed)
 Remote pacemaker transmission.

## 2024-01-16 ENCOUNTER — Telehealth: Payer: Self-pay | Admitting: Hematology and Oncology

## 2024-01-16 NOTE — Telephone Encounter (Signed)
 Left patient a vm regarding upcoming appointment

## 2024-01-16 NOTE — Telephone Encounter (Signed)
Spoke with patient husband confirming upcoming appointment  

## 2024-01-17 DIAGNOSIS — F0393 Unspecified dementia, unspecified severity, with mood disturbance: Secondary | ICD-10-CM | POA: Diagnosis not present

## 2024-01-17 DIAGNOSIS — I2510 Atherosclerotic heart disease of native coronary artery without angina pectoris: Secondary | ICD-10-CM | POA: Diagnosis not present

## 2024-01-17 DIAGNOSIS — K219 Gastro-esophageal reflux disease without esophagitis: Secondary | ICD-10-CM | POA: Diagnosis not present

## 2024-01-17 DIAGNOSIS — F32A Depression, unspecified: Secondary | ICD-10-CM | POA: Diagnosis not present

## 2024-01-17 DIAGNOSIS — I10 Essential (primary) hypertension: Secondary | ICD-10-CM | POA: Diagnosis not present

## 2024-01-17 DIAGNOSIS — S32512D Fracture of superior rim of left pubis, subsequent encounter for fracture with routine healing: Secondary | ICD-10-CM | POA: Diagnosis not present

## 2024-01-21 ENCOUNTER — Other Ambulatory Visit: Payer: Self-pay | Admitting: Cardiology

## 2024-01-23 ENCOUNTER — Encounter (HOSPITAL_COMMUNITY): Payer: Self-pay | Admitting: Gastroenterology

## 2024-01-23 DIAGNOSIS — K219 Gastro-esophageal reflux disease without esophagitis: Secondary | ICD-10-CM | POA: Diagnosis not present

## 2024-01-23 DIAGNOSIS — I2510 Atherosclerotic heart disease of native coronary artery without angina pectoris: Secondary | ICD-10-CM | POA: Diagnosis not present

## 2024-01-23 DIAGNOSIS — F0393 Unspecified dementia, unspecified severity, with mood disturbance: Secondary | ICD-10-CM | POA: Diagnosis not present

## 2024-01-23 DIAGNOSIS — F32A Depression, unspecified: Secondary | ICD-10-CM | POA: Diagnosis not present

## 2024-01-23 DIAGNOSIS — I10 Essential (primary) hypertension: Secondary | ICD-10-CM | POA: Diagnosis not present

## 2024-01-23 DIAGNOSIS — S32512D Fracture of superior rim of left pubis, subsequent encounter for fracture with routine healing: Secondary | ICD-10-CM | POA: Diagnosis not present

## 2024-01-29 ENCOUNTER — Telehealth (HOSPITAL_COMMUNITY): Payer: Self-pay

## 2024-01-29 ENCOUNTER — Ambulatory Visit (HOSPITAL_COMMUNITY): Admission: RE | Admit: 2024-01-29 | Source: Home / Self Care | Admitting: Gastroenterology

## 2024-01-29 SURGERY — COLONOSCOPY, WITH ARGON PLASMA COAGULATION
Anesthesia: Monitor Anesthesia Care

## 2024-01-29 NOTE — Telephone Encounter (Signed)
 Rachel Clark was scheduled for endo procedure with Dr. Dianna on 01/29/24, at [facility] hospital.   Patient/or family called on 01/29/24 to cancel their procedure due to patients husband reported she had a reaction to the prep medicine and was having faux seizures/jerking. Per husband, he manages medicines so he's sure she's been taking her seizure medicine appropriately.   Dr Schooler's office & endo notified. Patient instructed to call physician's office to reschedule their procedure. Patient demonstrated understanding.

## 2024-01-30 DIAGNOSIS — I2510 Atherosclerotic heart disease of native coronary artery without angina pectoris: Secondary | ICD-10-CM | POA: Diagnosis not present

## 2024-01-30 DIAGNOSIS — K219 Gastro-esophageal reflux disease without esophagitis: Secondary | ICD-10-CM | POA: Diagnosis not present

## 2024-01-30 DIAGNOSIS — F0393 Unspecified dementia, unspecified severity, with mood disturbance: Secondary | ICD-10-CM | POA: Diagnosis not present

## 2024-01-30 DIAGNOSIS — F32A Depression, unspecified: Secondary | ICD-10-CM | POA: Diagnosis not present

## 2024-01-30 DIAGNOSIS — S32512D Fracture of superior rim of left pubis, subsequent encounter for fracture with routine healing: Secondary | ICD-10-CM | POA: Diagnosis not present

## 2024-01-30 DIAGNOSIS — I10 Essential (primary) hypertension: Secondary | ICD-10-CM | POA: Diagnosis not present

## 2024-01-30 DIAGNOSIS — S32810D Multiple fractures of pelvis with stable disruption of pelvic ring, subsequent encounter for fracture with routine healing: Secondary | ICD-10-CM | POA: Diagnosis not present

## 2024-02-01 ENCOUNTER — Telehealth: Payer: Self-pay | Admitting: Diagnostic Neuroimaging

## 2024-02-01 NOTE — Telephone Encounter (Signed)
 This my chart in Sept has been cx, pt now scheduled for a my chart with Dr Penumalli

## 2024-02-04 DIAGNOSIS — I10 Essential (primary) hypertension: Secondary | ICD-10-CM | POA: Diagnosis not present

## 2024-02-04 DIAGNOSIS — F0393 Unspecified dementia, unspecified severity, with mood disturbance: Secondary | ICD-10-CM | POA: Diagnosis not present

## 2024-02-04 DIAGNOSIS — F32A Depression, unspecified: Secondary | ICD-10-CM | POA: Diagnosis not present

## 2024-02-04 DIAGNOSIS — K219 Gastro-esophageal reflux disease without esophagitis: Secondary | ICD-10-CM | POA: Diagnosis not present

## 2024-02-04 DIAGNOSIS — S32512D Fracture of superior rim of left pubis, subsequent encounter for fracture with routine healing: Secondary | ICD-10-CM | POA: Diagnosis not present

## 2024-02-04 DIAGNOSIS — I2510 Atherosclerotic heart disease of native coronary artery without angina pectoris: Secondary | ICD-10-CM | POA: Diagnosis not present

## 2024-02-07 DIAGNOSIS — I2510 Atherosclerotic heart disease of native coronary artery without angina pectoris: Secondary | ICD-10-CM | POA: Diagnosis not present

## 2024-02-07 DIAGNOSIS — Z7982 Long term (current) use of aspirin: Secondary | ICD-10-CM | POA: Diagnosis not present

## 2024-02-07 DIAGNOSIS — F0393 Unspecified dementia, unspecified severity, with mood disturbance: Secondary | ICD-10-CM | POA: Diagnosis not present

## 2024-02-07 DIAGNOSIS — I10 Essential (primary) hypertension: Secondary | ICD-10-CM | POA: Diagnosis not present

## 2024-02-07 DIAGNOSIS — K219 Gastro-esophageal reflux disease without esophagitis: Secondary | ICD-10-CM | POA: Diagnosis not present

## 2024-02-07 DIAGNOSIS — F32A Depression, unspecified: Secondary | ICD-10-CM | POA: Diagnosis not present

## 2024-02-07 DIAGNOSIS — Z7902 Long term (current) use of antithrombotics/antiplatelets: Secondary | ICD-10-CM | POA: Diagnosis not present

## 2024-02-07 DIAGNOSIS — Z9181 History of falling: Secondary | ICD-10-CM | POA: Diagnosis not present

## 2024-02-07 DIAGNOSIS — S32512D Fracture of superior rim of left pubis, subsequent encounter for fracture with routine healing: Secondary | ICD-10-CM | POA: Diagnosis not present

## 2024-02-07 DIAGNOSIS — Z556 Problems related to health literacy: Secondary | ICD-10-CM | POA: Diagnosis not present

## 2024-02-08 DIAGNOSIS — I10 Essential (primary) hypertension: Secondary | ICD-10-CM | POA: Diagnosis not present

## 2024-02-08 DIAGNOSIS — I2510 Atherosclerotic heart disease of native coronary artery without angina pectoris: Secondary | ICD-10-CM | POA: Diagnosis not present

## 2024-02-08 DIAGNOSIS — F32A Depression, unspecified: Secondary | ICD-10-CM | POA: Diagnosis not present

## 2024-02-08 DIAGNOSIS — K219 Gastro-esophageal reflux disease without esophagitis: Secondary | ICD-10-CM | POA: Diagnosis not present

## 2024-02-08 DIAGNOSIS — F0393 Unspecified dementia, unspecified severity, with mood disturbance: Secondary | ICD-10-CM | POA: Diagnosis not present

## 2024-02-08 DIAGNOSIS — S32512D Fracture of superior rim of left pubis, subsequent encounter for fracture with routine healing: Secondary | ICD-10-CM | POA: Diagnosis not present

## 2024-02-11 ENCOUNTER — Telehealth: Payer: Self-pay | Admitting: Diagnostic Neuroimaging

## 2024-02-11 DIAGNOSIS — K219 Gastro-esophageal reflux disease without esophagitis: Secondary | ICD-10-CM | POA: Diagnosis not present

## 2024-02-11 DIAGNOSIS — I2510 Atherosclerotic heart disease of native coronary artery without angina pectoris: Secondary | ICD-10-CM | POA: Diagnosis not present

## 2024-02-11 DIAGNOSIS — F0393 Unspecified dementia, unspecified severity, with mood disturbance: Secondary | ICD-10-CM | POA: Diagnosis not present

## 2024-02-11 DIAGNOSIS — F32A Depression, unspecified: Secondary | ICD-10-CM | POA: Diagnosis not present

## 2024-02-11 DIAGNOSIS — S32512D Fracture of superior rim of left pubis, subsequent encounter for fracture with routine healing: Secondary | ICD-10-CM | POA: Diagnosis not present

## 2024-02-11 DIAGNOSIS — I10 Essential (primary) hypertension: Secondary | ICD-10-CM | POA: Diagnosis not present

## 2024-02-11 NOTE — Telephone Encounter (Signed)
 appointment cx due to conflict

## 2024-02-12 ENCOUNTER — Telehealth: Admitting: Diagnostic Neuroimaging

## 2024-02-18 DIAGNOSIS — I2510 Atherosclerotic heart disease of native coronary artery without angina pectoris: Secondary | ICD-10-CM | POA: Diagnosis not present

## 2024-02-18 DIAGNOSIS — K219 Gastro-esophageal reflux disease without esophagitis: Secondary | ICD-10-CM | POA: Diagnosis not present

## 2024-02-18 DIAGNOSIS — I10 Essential (primary) hypertension: Secondary | ICD-10-CM | POA: Diagnosis not present

## 2024-02-18 DIAGNOSIS — F0393 Unspecified dementia, unspecified severity, with mood disturbance: Secondary | ICD-10-CM | POA: Diagnosis not present

## 2024-02-18 DIAGNOSIS — F32A Depression, unspecified: Secondary | ICD-10-CM | POA: Diagnosis not present

## 2024-02-18 DIAGNOSIS — S32512D Fracture of superior rim of left pubis, subsequent encounter for fracture with routine healing: Secondary | ICD-10-CM | POA: Diagnosis not present

## 2024-02-19 ENCOUNTER — Encounter (HOSPITAL_COMMUNITY): Payer: Medicare Other | Attending: Cardiology

## 2024-02-19 DIAGNOSIS — I495 Sick sinus syndrome: Secondary | ICD-10-CM | POA: Diagnosis not present

## 2024-02-20 ENCOUNTER — Telehealth: Payer: Self-pay | Admitting: Diagnostic Neuroimaging

## 2024-02-20 NOTE — Telephone Encounter (Signed)
 Spoke w/Pt husband regarding phone message. Husband stated he specifically asked her not to get a shower until he returned. He stated he went to say goodbye to her and caught her getting into the shower. He stated he did get upset and when he was speaking to her about why she did that he reported she immediately starting having jerking motions in her arm and lasted about 1 minute. He stated anytime she gets stressed or anxious he has noticed this starts but it has only been a couple of times since coming home from rehab. He thinks it is due to her being frustrated that she is unable to do the things she used to do and that it is not breakthrough seizures. Husband stated he has been diligent in being sure she gets all her medications as she is supposed to take them. Informed Pt husband Dr. Margaret is out of the office today but will send a message to provider and will call back with any new info or recommendations provider may have. Husband voiced understanding and thanks for call back.

## 2024-02-20 NOTE — Telephone Encounter (Signed)
 Pt's spouse is asking for a call to discuss him believing that pt's jerking is more psychological.he believes it is more physiological.  When pt is upset or is caught doing something she should not the jerking starts when she has forgotten about the incident the jerking stops.  Please call spouse to discuss.

## 2024-02-21 LAB — CUP PACEART REMOTE DEVICE CHECK
Battery Remaining Longevity: 35 mo
Battery Voltage: 2.95 V
Brady Statistic AP VP Percent: 0.01 %
Brady Statistic AP VS Percent: 0.92 %
Brady Statistic AS VP Percent: 0.09 %
Brady Statistic AS VS Percent: 98.98 %
Brady Statistic RA Percent Paced: 0.93 %
Brady Statistic RV Percent Paced: 0.1 %
Date Time Interrogation Session: 20250820144019
Implantable Lead Connection Status: 753985
Implantable Lead Connection Status: 753985
Implantable Lead Implant Date: 20160922
Implantable Lead Implant Date: 20160922
Implantable Lead Location: 753859
Implantable Lead Location: 753860
Implantable Lead Model: 5076
Implantable Lead Model: 5076
Implantable Pulse Generator Implant Date: 20160922
Lead Channel Impedance Value: 1349 Ohm
Lead Channel Impedance Value: 1387 Ohm
Lead Channel Impedance Value: 399 Ohm
Lead Channel Impedance Value: 494 Ohm
Lead Channel Pacing Threshold Amplitude: 0.875 V
Lead Channel Pacing Threshold Amplitude: 1.375 V
Lead Channel Pacing Threshold Pulse Width: 0.4 ms
Lead Channel Pacing Threshold Pulse Width: 0.4 ms
Lead Channel Sensing Intrinsic Amplitude: 2.625 mV
Lead Channel Sensing Intrinsic Amplitude: 2.625 mV
Lead Channel Sensing Intrinsic Amplitude: 7.25 mV
Lead Channel Sensing Intrinsic Amplitude: 7.25 mV
Lead Channel Setting Pacing Amplitude: 2.5 V
Lead Channel Setting Pacing Amplitude: 2.5 V
Lead Channel Setting Pacing Pulse Width: 0.4 ms
Lead Channel Setting Sensing Sensitivity: 2.8 mV
Zone Setting Status: 755011
Zone Setting Status: 755011

## 2024-02-22 NOTE — Telephone Encounter (Signed)
 Spoke w/Pt husband regarding phone message. Husband stated the jerking was more pronounced today and when the jerking would start Pt would stop speaking but was able to start speaking soon. Spouse is asking for an appt to see provider. They had an appt w/NP but had to cancel. Was able to provide an appt for 04/02/24 and spouse asked to be added to cancellation wait list, which was done. Spouse voiced thanks for the help and call back.

## 2024-02-22 NOTE — Telephone Encounter (Signed)
 Pt's spouse has called to report pt's jerking has intensified this morning to the point pt is able to see it in her ams and legs.

## 2024-02-25 ENCOUNTER — Ambulatory Visit: Payer: Self-pay | Admitting: Cardiovascular Disease

## 2024-02-27 DIAGNOSIS — I2510 Atherosclerotic heart disease of native coronary artery without angina pectoris: Secondary | ICD-10-CM | POA: Diagnosis not present

## 2024-02-27 DIAGNOSIS — F0393 Unspecified dementia, unspecified severity, with mood disturbance: Secondary | ICD-10-CM | POA: Diagnosis not present

## 2024-02-27 DIAGNOSIS — I10 Essential (primary) hypertension: Secondary | ICD-10-CM | POA: Diagnosis not present

## 2024-02-27 DIAGNOSIS — K219 Gastro-esophageal reflux disease without esophagitis: Secondary | ICD-10-CM | POA: Diagnosis not present

## 2024-02-27 DIAGNOSIS — F32A Depression, unspecified: Secondary | ICD-10-CM | POA: Diagnosis not present

## 2024-02-27 DIAGNOSIS — S32512D Fracture of superior rim of left pubis, subsequent encounter for fracture with routine healing: Secondary | ICD-10-CM | POA: Diagnosis not present

## 2024-02-29 DIAGNOSIS — H2513 Age-related nuclear cataract, bilateral: Secondary | ICD-10-CM | POA: Diagnosis not present

## 2024-02-29 DIAGNOSIS — H43393 Other vitreous opacities, bilateral: Secondary | ICD-10-CM | POA: Diagnosis not present

## 2024-03-04 DIAGNOSIS — F32A Depression, unspecified: Secondary | ICD-10-CM | POA: Diagnosis not present

## 2024-03-04 DIAGNOSIS — F0393 Unspecified dementia, unspecified severity, with mood disturbance: Secondary | ICD-10-CM | POA: Diagnosis not present

## 2024-03-04 DIAGNOSIS — I2510 Atherosclerotic heart disease of native coronary artery without angina pectoris: Secondary | ICD-10-CM | POA: Diagnosis not present

## 2024-03-04 DIAGNOSIS — I10 Essential (primary) hypertension: Secondary | ICD-10-CM | POA: Diagnosis not present

## 2024-03-04 DIAGNOSIS — S32512D Fracture of superior rim of left pubis, subsequent encounter for fracture with routine healing: Secondary | ICD-10-CM | POA: Diagnosis not present

## 2024-03-04 DIAGNOSIS — K219 Gastro-esophageal reflux disease without esophagitis: Secondary | ICD-10-CM | POA: Diagnosis not present

## 2024-03-08 DIAGNOSIS — I2510 Atherosclerotic heart disease of native coronary artery without angina pectoris: Secondary | ICD-10-CM | POA: Diagnosis not present

## 2024-03-08 DIAGNOSIS — I10 Essential (primary) hypertension: Secondary | ICD-10-CM | POA: Diagnosis not present

## 2024-03-08 DIAGNOSIS — Z556 Problems related to health literacy: Secondary | ICD-10-CM | POA: Diagnosis not present

## 2024-03-08 DIAGNOSIS — F0393 Unspecified dementia, unspecified severity, with mood disturbance: Secondary | ICD-10-CM | POA: Diagnosis not present

## 2024-03-08 DIAGNOSIS — K219 Gastro-esophageal reflux disease without esophagitis: Secondary | ICD-10-CM | POA: Diagnosis not present

## 2024-03-08 DIAGNOSIS — Z7982 Long term (current) use of aspirin: Secondary | ICD-10-CM | POA: Diagnosis not present

## 2024-03-08 DIAGNOSIS — S32512D Fracture of superior rim of left pubis, subsequent encounter for fracture with routine healing: Secondary | ICD-10-CM | POA: Diagnosis not present

## 2024-03-08 DIAGNOSIS — Z7902 Long term (current) use of antithrombotics/antiplatelets: Secondary | ICD-10-CM | POA: Diagnosis not present

## 2024-03-08 DIAGNOSIS — F32A Depression, unspecified: Secondary | ICD-10-CM | POA: Diagnosis not present

## 2024-03-08 DIAGNOSIS — Z9181 History of falling: Secondary | ICD-10-CM | POA: Diagnosis not present

## 2024-03-10 DIAGNOSIS — Z23 Encounter for immunization: Secondary | ICD-10-CM | POA: Diagnosis not present

## 2024-03-11 DIAGNOSIS — S32512D Fracture of superior rim of left pubis, subsequent encounter for fracture with routine healing: Secondary | ICD-10-CM | POA: Diagnosis not present

## 2024-03-11 DIAGNOSIS — I10 Essential (primary) hypertension: Secondary | ICD-10-CM | POA: Diagnosis not present

## 2024-03-11 DIAGNOSIS — F0393 Unspecified dementia, unspecified severity, with mood disturbance: Secondary | ICD-10-CM | POA: Diagnosis not present

## 2024-03-11 DIAGNOSIS — F32A Depression, unspecified: Secondary | ICD-10-CM | POA: Diagnosis not present

## 2024-03-11 DIAGNOSIS — I2510 Atherosclerotic heart disease of native coronary artery without angina pectoris: Secondary | ICD-10-CM | POA: Diagnosis not present

## 2024-03-11 DIAGNOSIS — K219 Gastro-esophageal reflux disease without esophagitis: Secondary | ICD-10-CM | POA: Diagnosis not present

## 2024-03-17 DIAGNOSIS — Z23 Encounter for immunization: Secondary | ICD-10-CM | POA: Diagnosis not present

## 2024-03-18 DIAGNOSIS — S32512D Fracture of superior rim of left pubis, subsequent encounter for fracture with routine healing: Secondary | ICD-10-CM | POA: Diagnosis not present

## 2024-03-18 DIAGNOSIS — F0393 Unspecified dementia, unspecified severity, with mood disturbance: Secondary | ICD-10-CM | POA: Diagnosis not present

## 2024-03-18 DIAGNOSIS — F32A Depression, unspecified: Secondary | ICD-10-CM | POA: Diagnosis not present

## 2024-03-18 DIAGNOSIS — I2510 Atherosclerotic heart disease of native coronary artery without angina pectoris: Secondary | ICD-10-CM | POA: Diagnosis not present

## 2024-03-18 DIAGNOSIS — I10 Essential (primary) hypertension: Secondary | ICD-10-CM | POA: Diagnosis not present

## 2024-03-18 DIAGNOSIS — K219 Gastro-esophageal reflux disease without esophagitis: Secondary | ICD-10-CM | POA: Diagnosis not present

## 2024-03-19 ENCOUNTER — Other Ambulatory Visit: Payer: Self-pay | Admitting: Cardiology

## 2024-03-20 ENCOUNTER — Telehealth: Admitting: Neurology

## 2024-03-24 ENCOUNTER — Other Ambulatory Visit: Payer: Self-pay | Admitting: Cardiology

## 2024-03-26 DIAGNOSIS — K219 Gastro-esophageal reflux disease without esophagitis: Secondary | ICD-10-CM | POA: Diagnosis not present

## 2024-03-26 DIAGNOSIS — I10 Essential (primary) hypertension: Secondary | ICD-10-CM | POA: Diagnosis not present

## 2024-03-26 DIAGNOSIS — I2510 Atherosclerotic heart disease of native coronary artery without angina pectoris: Secondary | ICD-10-CM | POA: Diagnosis not present

## 2024-03-26 DIAGNOSIS — F0393 Unspecified dementia, unspecified severity, with mood disturbance: Secondary | ICD-10-CM | POA: Diagnosis not present

## 2024-03-26 DIAGNOSIS — F32A Depression, unspecified: Secondary | ICD-10-CM | POA: Diagnosis not present

## 2024-03-26 DIAGNOSIS — S32512D Fracture of superior rim of left pubis, subsequent encounter for fracture with routine healing: Secondary | ICD-10-CM | POA: Diagnosis not present

## 2024-03-26 NOTE — Progress Notes (Signed)
 Remote PPM Transmission

## 2024-03-30 NOTE — Progress Notes (Signed)
 Cardiology Office Note   Date:  04/09/2024   ID:  Rachel Clark, DOB May 05, 1951, MRN [id number]  PCP:  Teresa Montie, MD  Cardiologist:   Aleph Nickson [place], MD   Chief Complaint  Patient presents with   Coronary Artery Disease   History of Present Illness: Rachel Clark is a 73 y.o. female is seen for follow up CAD. 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 Subsequent failure of SVG to RCA. S/p extensive stenting of RCA in the past- last intervention in 2017 with PTCA of ostial RCA for restenosis. In August 2019 stented RCA was widely patent.    Since November 2021 she has had multiple episodes of dizziness and syncope. She was found to be orthostatic. Seen in our clinic and later by Dr Kelsie. Pacemaker function normal. Imdur  was discontinued. Hydration seemed to improve symptoms. Echo was unremarkable.   Was seen in the ED on 09/04/20 with recurrent syncope after standing. She was orthostatic. Labs were unremarkable except mildly decreased sodium. No changes made at that time. Seen again in ED on May 11 with fall. Labs ok except low sodium level, pacer check and CXR Ok. Again hydrated. Her husband reports she passed out again on Father's day. Really doesn't have any warning. Is out only a few seconds. She has liberalized sodium intake. Wearing compression hose. Husband reports she naps 60% of the day. Seen recently by Dr Jenel. No seizures noted. Husband has kept a BP record. Sometimes has a 20 point drop in BP with standing but at other times no change. We stopped her amlodipine .   She was admitted 03/08/21 with a NSTEMI. Ecg showed diffuse ST depression. Troponin peaked at 39. She underwent repeat cardiac cath showing patent LIMA to the LAD and patent stents in the RCA. There was new disease in a large ramus branch with  sequential high grade stenoses. She had PCI which was difficult due to tortuosity and calcification of the vessel making deliverablility of stents difficult. The disease was successfully covered with 3 DES. She was placed on Effient  due to interaction of Brilinta  with Tegretol . DC the following day. She has a history of rash on Plavix . Indefinite DAPT recommended due to extensive stents. Hgb at time of DC was 12.1.   She was readmitted on 03/13/21 with acute upper GI bleed with coffee ground emesis. Hgb dropped to 7.7 then to 6.5. she was transfused multiple units of PRBCs. DAPT was continued given recent stents. EGD showed a visible vessel in the gastric antrum treated with epinephrine  injection and clipping. Treated with PPI. Bleeding scan was subsequently negative. Hgb improved to 9.6 at discharge.   She was seen in EP clinic in January. No significant arrhythmia noted and pacer OK.  She was admitted to [facility] in May 2023 with chest pain.  With low-level troponins she was initially treated medically and started on Imdur .  She returned to the ED later that evening with troponin rise in the 200s.  She underwent LHC with Difficult/complex but successful scoring balloon angioplasty with shockwave lithotripsy followed by distal overlapping DES stent placement to LCX.  With hemoglobin drop to 7.3, she received 2 units of pRBCs.   She underwent EGD showing small prepyloric gastric ulcer with no evidence of  active bleeding, some gastritis. Recommendations for IV protonix  BID while inpatient, then switch to protonix  40mg   BID for 4 weeks at discharge, then protonix  40mg  daily indefinitely. Was seen in June and doing OK. No bleeding. Repeat CBC in July showed stable Hgb 10.2.   She was evaluated in the ED on 01/23/2023 in the setting of chest pain.  Troponin was negative.  Outpatient follow-up was advised.  She was seen in the office on 02/02/2023 and reported recurrent chest pain.  She was scheduled for outpatient  cardiac catheterization and was started on Ranexa .  However, prior to her catheterization she presented to the ED on 02/03/2023 with chest pain.  Troponin was negative.  Cardiac catheterization was canceled and instead she underwent Lexiscan  Myoview  which was low risk.  Ranexa  was discontinued due to interaction with Tegretol .  She was started on amlodipine  5 mg daily.  Hemoglobin dropped to as low as 8.4 during admission, she received 1 unit PRBC.  EGD showed LA grade B esophagitis and gastric erosions, no active bleeding.  Follow-up with GI was recommended.  She was discharged home in stable condition on 02/08/2023.     She was admitted in Jan with hematochezia 2 weeks post colonoscopy with removal of multiple polyps. Repeat colonoscopy showed colonic ulcer that was clipped. Also had some oozing AVMs that were treated. She was seen in ED in late Feb following a fall. Head CT negative. Treated for UTI.   She was admitted in May with a fall and pubic fracture. Treated conservatively. Had iron  infusion.  She has continued to get daily PT. Uses a walker now. Denies any chest pain, palpitations, dizziness or SOB.     Past Medical History:  Diagnosis Date   Arthritis    fingers (06/29/2016)   Chronic lower back pain    Colon cancer (HCC) 12/04/2011   s/p Laparoscopic-assisted transverse colectomy on 12/19/2011 by Dr. Ebbie.  pT3 N0 M0.    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   last seizure was in 1972 ;takes Tegretol  (06/29/2016)    Headache    weekly (06/29/2016)   Hyperlipidemia    Hyponatremia    Hypothyroid    Iron  deficiency anemia 11/17/2011   Memory change 11/13/2016   Monoclonal gammopathy of unknown significance    Orthostatic hypotension    Presence of permanent cardiac  pacemaker    Syncope and collapse    pacemaker implanted    Past Surgical History:  Procedure Laterality Date   ANTERIOR CERVICAL DECOMP/DISCECTOMY FUSION  1980's   BACK SURGERY     CARDIAC CATHETERIZATION  05/20/2009   obstructive native vessel disease in LAD, RCA, and first diagonal, patent vein graft to distal RCA and LIMA to LAD,normal. ef 60%   CARDIAC CATHETERIZATION N/A 03/24/2015   Procedure: Left Heart Cath and Cors/Grafts Angiography;  Surgeon: Deatrice DELENA Cage, MD;  Location: [facility] INVASIVE CV LAB;  Service: Cardiovascular;  Laterality: N/A;   CARDIAC CATHETERIZATION N/A 07/28/2015   Procedure: Left Heart Cath and Coronary Angiography;  Surgeon: Debby DELENA Sor, MD;  Location: [facility] INVASIVE CV LAB;  Service: Cardiovascular;  Laterality: N/A;   CARDIAC CATHETERIZATION N/A 11/09/2015   Procedure: Coronary Stent Intervention;  Surgeon: Ozell Fell, MD;  Location: [facility] INVASIVE CV LAB;  Service: Cardiovascular;  Laterality: N/A;   CARDIAC CATHETERIZATION N/A 11/09/2015  Procedure: Left Heart Cath and Coronary Angiography;  Surgeon: Ozell Fell, MD;  Location: [facility] INVASIVE CV LAB;  Service: Cardiovascular;  Laterality: N/A;   CARDIAC CATHETERIZATION N/A 06/29/2016   Procedure: Left Heart Cath and Coronary Angiography;  Surgeon: Lonni Hanson, MD;  Location: [facility] INVASIVE CV LAB;  Service: Cardiovascular;  Laterality: N/A;   CARDIAC CATHETERIZATION N/A 06/29/2016   Procedure: Intravascular Pressure Wire/FFR Study;  Surgeon: Lonni Hanson, MD;  Location: [facility] INVASIVE CV LAB;  Service: Cardiovascular;  Laterality: N/A;   CARDIAC CATHETERIZATION N/A 06/29/2016   Procedure: Coronary Balloon Angioplasty;  Surgeon: Lonni Hanson, MD;  Location: [facility] INVASIVE CV LAB;  Service: Cardiovascular;  Laterality: N/A;   COLON RESECTION  12/19/2011   Procedure: COLON RESECTION LAPAROSCOPIC;  Surgeon: Donnice Bury, MD;  Location: [facility] OR;  Service: General;  Laterality: N/A;  laparoscopic hand assisted  partial colon resection   COLON SURGERY     COLONOSCOPY     COLONOSCOPY WITH PROPOFOL  N/A 07/28/2023   Procedure: COLONOSCOPY WITH PROPOFOL ;  Surgeon: Elicia Claw, MD;  Location: [facility] ENDOSCOPY;  Service: Gastroenterology;  Laterality: N/A;   CORONARY ANGIOPLASTY WITH STENT PLACEMENT  ~ 2007   1 stent   CORONARY ARTERY BYPASS GRAFT  2007   CABG X2   DILATION AND CURETTAGE OF UTERUS  1973   EP IMPLANTABLE DEVICE N/A 03/25/2015   MDT Advisa DR pacemaker implanted by Dr Kelsie for transient complete heart block and syncope   ESOPHAGOGASTRODUODENOSCOPY (EGD) WITH PROPOFOL  N/A 03/14/2021   Procedure: ESOPHAGOGASTRODUODENOSCOPY (EGD) WITH PROPOFOL ;  Surgeon: Saintclair Jasper, MD;  Location: [facility] ENDOSCOPY;  Service: Gastroenterology;  Laterality: N/A;   ESOPHAGOGASTRODUODENOSCOPY (EGD) WITH PROPOFOL  N/A 11/13/2021   Procedure: ESOPHAGOGASTRODUODENOSCOPY (EGD) WITH PROPOFOL ;  Surgeon: Elicia Claw, MD;  Location: [facility] ENDOSCOPY;  Service: Gastroenterology;  Laterality: N/A;   ESOPHAGOGASTRODUODENOSCOPY (EGD) WITH PROPOFOL  N/A 02/07/2023   Procedure: ESOPHAGOGASTRODUODENOSCOPY (EGD) WITH PROPOFOL ;  Surgeon: Elicia Claw, MD;  Location: [facility] ENDOSCOPY;  Service: Gastroenterology;  Laterality: N/A;   HEMOSTASIS CLIP PLACEMENT  03/14/2021   Procedure: HEMOSTASIS CLIP PLACEMENT;  Surgeon: Saintclair Jasper, MD;  Location: [facility] ENDOSCOPY;  Service: Gastroenterology;;   HEMOSTASIS CLIP PLACEMENT  07/28/2023   Procedure: HEMOSTASIS CLIP PLACEMENT;  Surgeon: Elicia Claw, MD;  Location: [facility] ENDOSCOPY;  Service: Gastroenterology;;   HOT HEMOSTASIS N/A 07/28/2023   Procedure: HOT HEMOSTASIS (ARGON PLASMA COAGULATION/BICAP);  Surgeon: Elicia Claw, MD;  Location: [facility] ENDOSCOPY;  Service: Gastroenterology;  Laterality: N/A;   INSERT / REPLACE / REMOVE PACEMAKER     LEFT HEART CATH AND CORONARY ANGIOGRAPHY N/A 11/11/2021   Procedure: LEFT HEART CATH AND CORONARY ANGIOGRAPHY;  Surgeon: Anner Alm ORN, MD;   Location: [facility] INVASIVE CV LAB;  Service: Cardiovascular;  Laterality: N/A;   LEFT HEART CATH AND CORS/GRAFTS ANGIOGRAPHY N/A 02/21/2018   Procedure: LEFT HEART CATH AND CORS/GRAFTS ANGIOGRAPHY;  Surgeon: Court Dorn PARAS, MD;  Location: [facility] INVASIVE CV LAB;  Service: Cardiovascular;  Laterality: N/A;   LEFT HEART CATH AND CORS/GRAFTS ANGIOGRAPHY N/A 03/08/2021   Procedure: LEFT HEART CATH AND CORS/GRAFTS ANGIOGRAPHY;  Surgeon: [place], Jenayah Antu M, MD;  Location: [facility] INVASIVE CV LAB;  Service: Cardiovascular;  Laterality: N/A;   PORT-A-CATH REMOVAL N/A 06/12/2013   Procedure: REMOVAL PORT-A-CATH;  Surgeon: Donnice Bury, MD;  Location: [facility];  Service: General;  Laterality: N/A;   PORTACATH PLACEMENT  06/04/2012   Procedure: INSERTION PORT-A-CATH;  Surgeon: Donnice Bury, MD;  Location: [facility] OR;  Service: General;  Laterality: N/A;  Insertion of port-a-cath  POSTERIOR LAMINECTOMY / DECOMPRESSION CERVICAL SPINE  1990s   SCLEROTHERAPY  03/14/2021   Procedure: SCLEROTHERAPY;  Surgeon: Saintclair Jasper, MD;  Location: [facility] ENDOSCOPY;  Service: Gastroenterology;;   VAGINAL HYSTERECTOMY       Current Outpatient Medications  Medication Sig Dispense Refill   acetaminophen  (TYLENOL ) 500 MG tablet Take 1,000 mg by mouth every 6 (six) hours as needed for mild pain (pain score 1-3).     amLODipine  (NORVASC ) 10 MG tablet Take 1 tablet (10 mg total) by mouth daily. (Patient taking differently: Take 5 mg by mouth daily.)     aspirin  EC 81 MG tablet Take 1 tablet (81 mg total) by mouth daily. Swallow whole.     Bempedoic Acid -Ezetimibe  (NEXLIZET ) 180-10 MG TABS TAKE ONE TABLET BY MOUTH ONE TIME DAILY 90 tablet 2   calcium  carbonate (TUMS - DOSED IN MG ELEMENTAL CALCIUM ) 500 MG chewable tablet Chew 500 mg by mouth daily.     carbamazepine  (TEGRETOL  XR) 200 MG 12 hr tablet Take 3 tablets (600 mg total) by mouth 2 (two) times daily. 540 tablet 4   cholecalciferol  (CHOLECALCIFEROL ) 25 MCG tablet Take 4  tablets (4,000 Units total) by mouth daily.     diclofenac  Sodium (VOLTAREN ) 1 % GEL Apply 4 g topically 4 (four) times daily.     docusate sodium  (COLACE) 100 MG capsule Take 1 capsule (100 mg total) by mouth 2 (two) times daily.     DULoxetine  (CYMBALTA ) 60 MG capsule Take 1 capsule (60 mg total) by mouth 2 (two) times daily.     ethosuximide  (ZARONTIN ) 250 MG capsule Take 2 capsules (500 mg total) by mouth 2 (two) times daily. 360 capsule 4   Evolocumab  (REPATHA  SURECLICK) 140 MG/ML SOAJ INJECT (140MG ) INTO THE SKIN EVERY 14 DAYS 2 mL 11   ferrous sulfate  325 (65 FE) MG tablet Take 325 mg by mouth daily with breakfast.     fluticasone  (FLONASE ) 50 MCG/ACT nasal spray Place 1 spray into both nostrils daily. (Patient taking differently: Place 1 spray into both nostrils daily. As needed)     gabapentin  (NEURONTIN ) 100 MG capsule Take 1 capsule (100 mg total) by mouth 3 (three) times daily.     isosorbide  mononitrate (IMDUR ) 30 MG 24 hr tablet Take one-half tablet by mouth every day 45 tablet 0   levothyroxine  (SYNTHROID , LEVOTHROID) 50 MCG tablet Take 50 mcg by mouth daily before breakfast.     lidocaine  (LIDODERM ) 5 % Place 2 patches onto the skin daily. Remove & Discard patch within 12 hours or as directed by MD 30 patch 0   memantine  (NAMENDA ) 10 MG tablet Take 1 tablet (10 mg total) by mouth 2 (two) times daily. 180 tablet 4   methocarbamol  (ROBAXIN ) 500 MG tablet Take 1 tablet (500 mg total) by mouth every 8 (eight) hours as needed for muscle spasms.     nitroGLYCERIN  (NITROSTAT ) 0.4 MG SL tablet place 1 tablet under the tongue every 5 minutes as needed for chest pain 25 tablet 3   pantoprazole  (PROTONIX ) 40 MG tablet TAKE 1 TABLET BY MOUTH TWICE A DAY 180 tablet 3   polyethylene glycol (MIRALAX  / GLYCOLAX ) 17 g packet Take 17 g by mouth daily as needed.     prasugrel  (EFFIENT ) 10 MG TABS tablet TAKE ONE TABLET BY MOUTH ONE TIME DAILY 90 tablet 3   sodium chloride  1 g tablet Take 1 tablet  (1 g total) by mouth 2 (two) times daily with a meal.  traMADol  (ULTRAM ) 50 MG tablet Take 1-2 tablets (50-100 mg total) by mouth every 6 (six) hours as needed for severe pain (pain score 7-10) or moderate pain (pain score 4-6). 5 tablet 0   vitamin B-12 (CYANOCOBALAMIN ) 100 MCG tablet Take 100 mcg by mouth daily.     No current facility-administered medications for this visit.    Allergies:   Sulfamethoxazole, Aspirin , Crestor  [rosuvastatin ], Erythromycin, Lisinopril, Niacin and related, Penicillin g benzathine, Statins, Sulfa drugs cross reactors, Tetracyclines & related, Xeloda  [capecitabine ], Penicillins, and Plavix  [clopidogrel  bisulfate]    Social History:  The patient  reports that she has never smoked. She has never used smokeless tobacco. She reports that she does not drink alcohol and does not use drugs.   Family History:  The patient's family history includes Cancer in her mother; Heart attack in her father; Heart attack (age of onset: 56) in her brother; Heart disease in her father.    ROS:  Please see the history of present illness.   Otherwise, review of systems are positive for none.   All other systems are reviewed and negative.    PHYSICAL EXAM: VS:  BP 110/60 (BP Location: Left Arm, Patient Position: Sitting, Cuff Size: Normal)   Pulse 81   Ht 5' 7 (1.702 m)   Wt 160 lb (72.6 kg)   SpO2 96%   BMI 25.06 kg/m  , BMI Body mass index is 25.06 kg/m.   GEN: Well nourished, well developed, in no acute distress  HEENT: normal  Neck: no JVD, carotid bruits, or masses Cardiac: RRR; no murmurs, rubs, or gallops,no edema  Respiratory:  clear to auscultation bilaterally, normal work of breathing GI: soft, nontender, nondistended, + BS MS: no deformity or atrophy. No groin hematoma. Skin: warm and dry, no rash Neuro:  Strength and sensation are intact Psych: euthymic mood, full affect   EKG:  EKG is not ordered today.     Recent Labs: 07/27/2023: Magnesium  1.7 10/17/2023: TSH 1.280 01/03/2024: ALT 10; BUN 15; Creatinine 0.60; Hemoglobin 11.2; Platelet Count 427; Potassium 4.0; Sodium 138   Dated 03/24/21: Hgb 12.2. normal BMET.  Dated 05/30/21: cholesterol 241, triglycerides 116, HDL 81, LDL 140. CMET and TSH normal. Dated 07/17/23: cholesterol 157, triglycerides 85, HDL 73, LDL 72 Dated 10/20/23: cholesterol 131, triglycerides 86, HDL 64, LDL 50.    Lipid Panel    Component Value Date/Time   CHOL 131 10/20/2023 1908   CHOL 163 10/10/2023 1137   TRIG 86 10/20/2023 1908   HDL 64 10/20/2023 1908   HDL 88 10/10/2023 1137   CHOLHDL 2.0 10/20/2023 1908   VLDL 17 10/20/2023 1908   LDLCALC 50 10/20/2023 1908   LDLCALC 59 10/10/2023 1137       Wt Readings from Last 3 Encounters:  04/09/24 160 lb (72.6 kg)  04/02/24 160 lb 6.4 oz (72.8 kg)  01/15/24 157 lb 12.8 oz (71.6 kg)      Other studies Reviewed: Additional studies/ records that were reviewed today include:   Cardiac cath 02/21/18:  LEFT HEART CATH AND CORS/GRAFTS ANGIOGRAPHY    Conclusion    Previously placed Ost RCA to Mid RCA stent (unknown type) is widely patent. Prox LAD lesion is 100% stenosed. Origin lesion is 100% stenosed. The left ventricular systolic function is normal. LV end diastolic pressure is normal. The left ventricular ejection fraction is 55-65% by visual estimate.     Echo 07/30/20: IMPRESSIONS     1. Left ventricular ejection fraction, by estimation, is 60  to 65%. The  left ventricle has normal function. The left ventricle has no regional  wall motion abnormalities. Left ventricular diastolic parameters are  consistent with Grade I diastolic  dysfunction (impaired relaxation).   2. Right ventricular systolic function is normal. The right ventricular  size is normal.   3. The mitral valve is normal in structure. Trivial mitral valve  regurgitation. No evidence of mitral stenosis.   4. The aortic valve is tricuspid. Aortic valve regurgitation is  not  visualized. Mild aortic valve sclerosis is present, with no evidence of  aortic valve stenosis.   5. The inferior vena cava is normal in size with greater than 50%  respiratory variability, suggesting right atrial pressure of 3 mmHg.   Comparison(s): Prior images unable to be directly viewed, comparison made  by report only. No significant change from prior study. 07/26/15 EF 60-65%.  GLS -18.6%.   Cardiac cath 03/08/21:  LEFT HEART CATH AND CORS/GRAFTS ANGIOGRAPHY   Conclusion      Prox LAD lesion is 100% stenosed.   Origin lesion is 100% stenosed.   Ost LAD to Prox LAD lesion is 60% stenosed.   Ost 1st Diag to 1st Diag lesion is 85% stenosed.   Ramus lesion is 99% stenosed.   Ost Ramus to Ramus lesion is 90% stenosed.   Non-stenotic Ost RCA to Mid RCA lesion was previously treated.   A drug-eluting stent was successfully placed using a STENT ONYX FRONTIER 3.0X15.   A drug-eluting stent was successfully placed using a SYNERGY XD 3.0X16.   A drug-eluting stent was successfully placed using a SYNERGY XD 3.0X20.   Post intervention, there is a 0% residual stenosis.   Post intervention, there is a 0% residual stenosis.   The left ventricular systolic function is normal.   LV end diastolic pressure is normal.   The left ventricular ejection fraction is 55-65% by visual estimate.   The radiation dose exceeded thresholds defined in the Patient Radiation Dose Management For Interventional Medical Procedures With Extensive Use of Fluoroscopy policy. Specific follow up instructions will be provided to the patient prior to discharge.   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 This is the culprit Normal LV function Normal LVEDP Successful PCI of the Ramus intermediate with DES x 3 in overlapping fashion.   Plan: DAPT for one year.  Anticipate possible DC tomorrow.   Diagnostic Dominance: Right Intervention   Cardiac cath 11/11/21:  LEFT HEART CATH AND CORONARY ANGIOGRAPHY   Conclusion      Ost LAD to Prox LAD lesion is 60% stenosed. Prox LAD lesion is 100% stenosed.   LIMA-graft was visualized by angiography and is normal in caliber.  The graft exhibits no disease.   SVG-dRCA: Origin lesion is 100% stenosed.  graft was not visualized due to known occlusion.   Ost RCA to Mid RCA overlapping full metal jacket DES stents, widely patent.   Ost 1st Diag to 1st Diag lesion is 85% stenosed.   ----------------------   Lesion segment #2 (all in-stent restenosis) Ost Ramus to Ramus lesion is 75% stenosed.  Ramus-2 lesion is 85% stenosed.  Ramus-1 lesion is 35% stenosed.   Scoring balloon angioplasty was performed using a BALLN SCOREFLEX 2.50X15 -> followed by post dilation with a Benton Balloon 3.0 mm x 15 mm   Post intervention, there  is a 20% residual stenosis  in the proximal portion of stent segment.  Post intervention, there is a 0% residual stenosis in the distal portion of the stent.   CULPRIT LESION #1: Ramus-3 lesion is 95% stenosed.-2 tandem locations of calcified stenosis.   Scoring balloon angioplasty was performed using a BALLN SCOREFLEX 2.50X15.   A drug-eluting stent was successfully placed (overlapping the distal stent) using a STENT ONYX FRONTIER 2.5X30 -> tapered postdilation from overlap 3.1 mm down to 2.8 mm distally.   Post intervention, there is a 0% residual stenosis.   ------------------------------------------------------------------------------   LV end diastolic pressure is normal.   There is no aortic valve stenosis.   SUMMARY Severe Native vessel CAD: stable severe proximal to mid LAD disease with LAD occlusion and patent LIMA-LAD,  widely patent full metal jacket stented RCA,  diffuse severe in-stent restenosis throughout the proximal and midportion of the stented segment of the native LCx with  severe eccentric calcified lesions distal to the stent. Difficult/complex but successful scoring balloon angioplasty with shockwave lithotripsy followed by distal overlapping DES stent placement (Onyx Frontier DES 2.5 x 30 tapered postdilation from 3.1 in the overlap segment to 2.8 distally) Scoreflex angioplasty followed by post dilation with 3.0 mm Roodhouse balloon throughout the entire stented segment of the LCx reducing stenosis in the midportion to 10% and more proximally to 20%. Normal LVEDP   RECOMMENDATION Monitor overnight, anticipate discharge in the morning. Aggressive risk factor modification Essentially lifelong finding.  In coverage with DAPT x1 year       Alm Clay, MD   Echo 02/04/23:  IMPRESSIONS     1. Left ventricular ejection fraction, by estimation, is 50 to 55%. The  left ventricle has low normal function. The left ventricle demonstrates  regional wall motion abnormalities (see scoring diagram/findings for  description). Left ventricular diastolic   parameters are consistent with Grade I diastolic dysfunction (impaired  relaxation).   2. Right ventricular systolic function is normal. The right ventricular  size is normal. Tricuspid regurgitation signal is inadequate for assessing  PA pressure.   3. The mitral valve is normal in structure. Mild to moderate mitral valve  regurgitation. No evidence of mitral stenosis.   4. The aortic valve is tricuspid. Aortic valve regurgitation is not  visualized. No aortic stenosis is present.   5. The inferior vena cava is normal in size with greater than 50%  respiratory variability, suggesting right atrial pressure of 3 mmHg.      Myoview  02/06/23: IMPRESSION: 1. No reversible ischemia or infarction.   2. Normal left ventricular wall motion.   3. Left ventricular ejection fraction 61%   4. Non invasive risk stratification*: Low ASSESSMENT AND PLAN:  1.  History of  orthostatic hypotension/and/or neurocardiogenic  syncope. Pacer evaluation has been normal. Continue liberalization of sodium in diet. Maintain good hydration. Need to allow more permissive BP. Will continue current doses of amlodipine  and Imdur  for now.   2. CAD. S/p CABG. S/p extensive stenting of RCA.  in 2017. S/p NSTEMI with complex stenting of a large ramus intermediate branch with DES x 3 in September 2022.  Patent LIMA to LAD. Patent stents in RCA. On ASA and Effient  indefinitely. Admitted in May 2023 with NSTEMI and complex PCI of the LCx by Dr Clay.  Normal Myoview  in Aug 2024. Continue current therapy. On long term DAPT.   3. Hyperlipidemia. Last LDL 50. Excellent response with Repatha   4. SSS. S/p pacemaker. Followed in device clinic.  5. Acute upper GI bleed requiring transfusion. S/p clipping of visible vessel in gastric antrum. More recent EGD in May showed gastric ulcer (nonbleeding) and gastritis.  On PPI. 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 Followed by GI  6. History of seizures. Monitored by Dr Jenel.   7. Chronic mild hyponatremia.   8. Memory loss/dementia.    Current medicines are reviewed at length with the patient today.  The patient does not have concerns regarding medicines.  The following changes have been made:  See above.  Labs/ tests ordered today include: none  No orders of the defined types were placed in this encounter.     Disposition:   FU 6 months  Signed, Annaleigha Woo [place], MD  04/09/2024 10:21 AM    [facility] [address] [place], [place], [address] Phone [phone], Fax [phone]

## 2024-04-02 ENCOUNTER — Encounter: Payer: Self-pay | Admitting: Diagnostic Neuroimaging

## 2024-04-02 ENCOUNTER — Ambulatory Visit: Admitting: Diagnostic Neuroimaging

## 2024-04-02 VITALS — BP 120/74 | HR 85 | Ht 68.0 in | Wt 160.4 lb

## 2024-04-02 DIAGNOSIS — R569 Unspecified convulsions: Secondary | ICD-10-CM | POA: Diagnosis not present

## 2024-04-02 DIAGNOSIS — G308 Other Alzheimer's disease: Secondary | ICD-10-CM

## 2024-04-02 DIAGNOSIS — F02B3 Dementia in other diseases classified elsewhere, moderate, with mood disturbance: Secondary | ICD-10-CM | POA: Diagnosis not present

## 2024-04-02 DIAGNOSIS — G253 Myoclonus: Secondary | ICD-10-CM | POA: Diagnosis not present

## 2024-04-02 MED ORDER — CARBAMAZEPINE ER 200 MG PO TB12: 600.0000 mg | ORAL_TABLET | Freq: Two times a day (BID) | ORAL | 4 refills | Status: AC

## 2024-04-02 MED ORDER — ETHOSUXIMIDE 250 MG PO CAPS: 500.0000 mg | ORAL_CAPSULE | Freq: Two times a day (BID) | ORAL | 4 refills | Status: AC

## 2024-04-02 MED ORDER — MEMANTINE HCL 10 MG PO TABS: 10.0000 mg | ORAL_TABLET | Freq: Two times a day (BID) | ORAL | 4 refills | Status: AC

## 2024-04-02 NOTE — Progress Notes (Signed)
 [facility]  PATIENT: Rachel Clark DOB: [birth year]  REFERRING CLINICIAN: Teresa Montie, MD HISTORY FROM: patient and husband  REASON FOR VISIT: follow up   HISTORICAL  CHIEF COMPLAINT:  Chief Complaint  Patient presents with   RM 7    Patient is here with husband for tremor concerns - had 2 falls in April fractured her pelvis and had jerking with her  arms and some events where she unable to make it to the bathroom in time  MMSE 16     HISTORY OF PRESENT ILLNESS:   UPDATE (04/02/24, VRP): Since last visit, had pelvis fracture in April 2025. Then 6 week rehab, then back home by June 2025. July 2025 had colonoscopy prep, but weakness limited access to bathroom. Then had more myoclonus events, and colonoscopy was cancelled. Now feeling stronger, and ready to try again. Memory loss continues.   UPDATE (09/06/23, VRP): Since last visit, doing well until 08/30/23, with worsening myoclonus. Went to ER and AED levels were low. Now back on meds and myoclonus has stopped. Memory loss continues. Somewhat sedentary.   UPDATE (11/06/22, VRP): Since last visit, doing better!. No more myoclonus. Memory loss continues.   UPDATE (09/25/22, VRP): Since last visit, stable until yesterday. New onset of confusion, muscle jerks. She did not reduce her CBZ dosing as per Lauraine Born recommendation on 09/19/22 phone call. 09/25/22. Some lightheadedness, dizziness.   UPDATE (09/13/21, VRP): Since last visit, doing well. Symptoms are stable. No more seizures. Memory loss stable. No major changes in ADLs. No more syncope attacks.   PRIOR HPI (12/21/20, Dr. Jenel): Rachel Clark is a 73 year old right-handed white female with a history of seizures since she was a child, she has not had a seizure in over 50 years, she is on carbamazepine  and Zarontin , she has never wished to try to get off of her medication.  Her son also has a history of seizures.  Since August 2019 she has had low sodium levels.   She has been on a combination of carbamazepine  and Cymbalta , but she has been on this for quite a number of years without any sodium level problems.  The patient has more recently had significant issues with orthostatic hypotension.  She does have a monoclonal antibody and is followed through hematology.  She has not had a demonstrated peripheral neuropathy on nerve conduction studies in the past.  The patient however has had significant issues with being dizzy with standing, she has had multiple syncopal episodes, the most recent was 2 days ago.  The patient has been to the emergency room on 04 September 2020 after she hit her head with a blackout.  She went to the emergency room on 10 Nov 2020 with another significant blackout.  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  The patient is followed by Dr. Peter [place].  Blood pressure medications have been reduced in dosing.  The patient has gone from 10 mg a day to 2.5 mg and Norvasc , and the Coreg  dose was cut in half.  The patient has been told to increase fluids and salt.  She does have compression stockings.  The patient has had some decline in memory over time.  She is no longer driving mainly due to the dizziness issue.  The patient has had increasing problems with remembering recipes with cooking, she is still managing her medications.  Her husband helps her with the appointments.  The patient  is repeating herself frequently throughout the day.  The patient claims that she feels bored, isolated.  She sleeps quite a bit throughout the day.  She comes to this office for further evaluation.  The patient does have a prior history of depression which is still an issue for her.  REVIEW OF SYSTEMS: Full 14 system review of systems performed and negative with exception of: as per HPI.  ALLERGIES: Allergies  Allergen Reactions   Sulfamethoxazole Anaphylaxis    Don't recall   Aspirin  Other (See Comments)     GI upset- can tolerate 81 mg ASA,and chewables, just not full doses   Crestor  [Rosuvastatin ]     myalgias   Erythromycin Nausea Only    Gi upset   Lisinopril Cough   Niacin And Related Other (See Comments)    Don't recall   Penicillin G Benzathine     Other reaction(s): Unknown   Statins Other (See Comments)    Muscle pain   Sulfa Drugs Cross Reactors Swelling    Don't recall   Tetracyclines & Related Nausea Only   Xeloda  [Capecitabine ] Diarrhea   Penicillins Nausea Only and Rash        Plavix  [Clopidogrel  Bisulfate] Rash    HOME MEDICATIONS: Outpatient Medications Prior to Visit  Medication Sig Dispense Refill   acetaminophen  (TYLENOL ) 500 MG tablet Take 1,000 mg by mouth every 6 (six) hours as needed for mild pain (pain score 1-3).     amLODipine  (NORVASC ) 10 MG tablet Take 1 tablet (10 mg total) by mouth daily. (Patient taking differently: Take 5 mg by mouth daily.)     aspirin  EC 81 MG tablet Take 1 tablet (81 mg total) by mouth daily. Swallow whole.     Bempedoic Acid -Ezetimibe  (NEXLIZET ) 180-10 MG TABS TAKE ONE TABLET BY MOUTH ONE TIME DAILY 90 tablet 2   calcium  carbonate (TUMS - DOSED IN MG ELEMENTAL CALCIUM ) 500 MG chewable tablet Chew 500 mg by mouth daily.     cholecalciferol  (CHOLECALCIFEROL ) 25 MCG tablet Take 4 tablets (4,000 Units total) by mouth daily.     diclofenac  Sodium (VOLTAREN ) 1 % GEL Apply 4 g topically 4 (four) times daily.     DULoxetine  (CYMBALTA ) 60 MG capsule Take 1 capsule (60 mg total) by mouth 2 (two) times daily.     Evolocumab  (REPATHA  SURECLICK) 140 MG/ML SOAJ INJECT (140MG ) INTO THE SKIN EVERY 14 DAYS 2 mL 11   ferrous sulfate  325 (65 FE) MG tablet Take 325 mg by mouth daily with breakfast.     fluticasone  (FLONASE ) 50 MCG/ACT nasal spray Place 1 spray into both nostrils daily. (Patient taking differently: Place 1 spray into both nostrils daily. As needed)     isosorbide  mononitrate (IMDUR ) 30 MG 24 hr tablet Take one-half tablet by  mouth every day 45 tablet 0   levothyroxine  (SYNTHROID , LEVOTHROID) 50 MCG tablet Take 50 mcg by mouth daily before breakfast.     nitroGLYCERIN  (NITROSTAT ) 0.4 MG SL tablet place 1 tablet under the tongue every 5 minutes as needed for chest pain 25 tablet 3   pantoprazole  (PROTONIX ) 40 MG tablet TAKE 1 TABLET BY MOUTH TWICE A DAY 180 tablet 3   polyethylene glycol (MIRALAX  / GLYCOLAX ) 17 g packet Take 17 g by mouth daily as needed.     prasugrel  (EFFIENT ) 10 MG TABS tablet TAKE ONE TABLET BY MOUTH ONE TIME DAILY 90 tablet 3   sodium chloride  1 g tablet Take 1 tablet (1 g total) by mouth 2 (  two) times daily with a meal.     tamsulosin  (FLOMAX ) 0.4 MG CAPS capsule Take 2 capsules (0.8 mg total) by mouth at bedtime. (Patient taking differently: Take 0.8 mg by mouth at bedtime. AS NEEDED)     traMADol  (ULTRAM ) 50 MG tablet Take 1-2 tablets (50-100 mg total) by mouth every 6 (six) hours as needed for severe pain (pain score 7-10) or moderate pain (pain score 4-6). 5 tablet 0   vitamin B-12 (CYANOCOBALAMIN ) 100 MCG tablet Take 100 mcg by mouth daily.     carbamazepine  (TEGRETOL  XR) 200 MG 12 hr tablet Take 3 tablets (600 mg total) by mouth 2 (two) times daily. 540 tablet 4   ethosuximide  (ZARONTIN ) 250 MG capsule Take 2 capsules (500 mg total) by mouth 2 (two) times daily. 360 capsule 4   memantine  (NAMENDA ) 10 MG tablet Take 1 tablet (10 mg total) by mouth 2 (two) times daily. 180 tablet 4   cefadroxil  (DURICEF) 500 MG capsule Take 1 capsule (500 mg total) by mouth 2 (two) times daily.     docusate sodium  (COLACE) 100 MG capsule Take 1 capsule (100 mg total) by mouth 2 (two) times daily.     gabapentin  (NEURONTIN ) 100 MG capsule Take 1 capsule (100 mg total) by mouth 3 (three) times daily.     lidocaine  (LIDODERM ) 5 % Place 2 patches onto the skin daily. Remove & Discard patch within 12 hours or as directed by MD (Patient not taking: Reported on 04/02/2024) 30 patch 0   methocarbamol  (ROBAXIN ) 500 MG  tablet Take 1 tablet (500 mg total) by mouth every 8 (eight) hours as needed for muscle spasms.     No facility-administered medications prior to visit.     PHYSICAL EXAM  GENERAL EXAM/CONSTITUTIONAL: Vitals:  Vitals:   04/02/24 1022  BP: 120/74  Pulse: 85  SpO2: 95%  Weight: 160 lb 6.4 oz (72.8 kg)  Height: 5' 8 (1.727 m)   Body mass index is 24.39 kg/m. Wt Readings from Last 3 Encounters:  04/02/24 160 lb 6.4 oz (72.8 kg)  01/15/24 157 lb 12.8 oz (71.6 kg)  11/05/23 167 lb 15.9 oz (76.2 kg)   Patient is in no distress; well developed, nourished and groomed; neck is supple  CARDIOVASCULAR: Examination of carotid arteries is normal; no carotid bruits Regular rate and rhythm, no murmurs Examination of peripheral vascular system by observation and palpation is normal  EYES: Ophthalmoscopic exam of optic discs and posterior segments is normal; no papilledema or hemorrhages No results found.  MUSCULOSKELETAL: Gait, strength, tone, movements noted in Neurologic exam below  NEUROLOGIC: MENTAL STATUS:     04/02/2024   10:40 AM 12/21/2020    7:21 AM 08/17/2020   10:04 AM  MMSE - Mini Mental State Exam  Orientation to time 2 2 3   Orientation to Place 3 4 4   Registration 3 3 3   Attention/ Calculation 1 1 5   Recall 0 0 3  Language- name 2 objects 2 2 2   Language- repeat 0 1 1  Language- follow 3 step command 3 3 3   Language- read & follow direction 1 1 1   Write a sentence 1 1 1   Copy design 0 1 1  Total score 16 19 27    awake, alert, oriented to person, place and time recent and remote memory intact normal attention and concentration language fluent, comprehension intact, naming intact fund of knowledge appropriate  CRANIAL NERVE:  2nd - no papilledema on fundoscopic exam 2nd, 3rd, 4th, 6th -  pupils equal and reactive to light, visual fields full to confrontation, extraocular muscles intact, no nystagmus 5th - facial sensation symmetric 7th - facial strength  symmetric 8th - hearing intact 9th - palate elevates symmetrically, uvula midline 11th - shoulder shrug symmetric 12th - tongue protrusion midline  MOTOR:  normal bulk and tone, full strength in the BUE, BLE FOOT TAPPING SLOW (L SLOWER THAN R)  SENSORY:  normal and symmetric to light touch, temperature, vibration  COORDINATION:  finger-nose-finger, fine finger movements normal  REFLEXES:  deep tendon reflexes TRACE and symmetric  GAIT/STATION:  narrow based gait; USING WALKER    DIAGNOSTIC DATA (LABS, IMAGING, TESTING) - I reviewed patient records, labs, notes, testing and imaging myself where available.  Lab Results  Component Value Date   WBC 5.2 01/03/2024   HGB 11.2 (L) 01/03/2024   HCT 33.4 (L) 01/03/2024   MCV 92.0 01/03/2024   PLT 427 (H) 01/03/2024      Component Value Date/Time   NA 138 01/03/2024 0903   NA 133 (L) 10/10/2023 1137   NA 137 07/11/2016 0820   K 4.0 01/03/2024 0903   K 4.4 07/11/2016 0820   CL 103 01/03/2024 0903   CL 106 12/25/2012 0924   CO2 29 01/03/2024 0903   CO2 27 07/11/2016 0820   GLUCOSE 114 (H) 01/03/2024 0903   GLUCOSE 115 07/11/2016 0820   GLUCOSE 105 (H) 12/25/2012 0924   BUN 15 01/03/2024 0903   BUN 17 10/10/2023 1137   BUN 15.6 07/11/2016 0820   CREATININE 0.60 01/03/2024 0903   CREATININE 0.8 07/11/2016 0820   CALCIUM  9.1 01/03/2024 0903   CALCIUM  8.9 07/11/2016 0820   PROT 7.0 01/03/2024 0903   PROT 6.4 10/10/2023 1137   PROT 7.1 07/11/2016 0820   ALBUMIN 3.8 01/03/2024 0903   ALBUMIN 4.0 10/10/2023 1137   ALBUMIN 3.6 07/11/2016 0820   AST 11 (L) 01/03/2024 0903   AST 13 07/11/2016 0820   ALT 10 01/03/2024 0903   ALT 14 07/11/2016 0820   ALKPHOS 97 01/03/2024 0903   ALKPHOS 89 07/11/2016 0820   BILITOT 0.2 01/03/2024 0903   BILITOT 0.29 07/11/2016 0820   GFRNONAA >60 01/03/2024 0903   GFRAA 97 08/17/2020 1111   Lab Results  Component Value Date   CHOL 131 10/20/2023   HDL 64 10/20/2023   LDLCALC 50  10/20/2023   TRIG 86 10/20/2023   CHOLHDL 2.0 10/20/2023   Lab Results  Component Value Date   HGBA1C 5.5 02/20/2018   Lab Results  Component Value Date   VITAMINB12 1,123 (H) 10/22/2023   Lab Results  Component Value Date   TSH 1.280 10/17/2023   11/01/22 Neuropsychology testing - Major Neurocognitive Disorder (i.e., 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 A normal NfL concentration was observed at this time. These results are consistent with the presence of Alzheimer's related pathology.    09/04/20 CT head  1. No acute intracranial abnormality. 2. LEFT forehead subcutaneous hematoma.  10/09/22 CT head  1. No recent insult or specific cause for symptoms. Stable compared to 2022. 2. Left temporal lobe encephalomalacia.    ASSESSMENT AND PLAN  73 y.o. year old female here with:  Dx:  1. 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 Seizures (HCC)   3. Myoclonus      PLAN:  SEIZURE DISORDER --> NEW ONSET MYOCLONUS, MEMORY LOSS, CONFUSION,  FATIGUE (in 09/24/22 and Feb 2025, related to low levels)  - continue carbamazepine  600mg  twice a day + ethosuximide  500mg  twice a day  - seizure precautions reviewed - ok to proceed with colonosccopy  MEMORY LOSS (MCI diagnosed in 2019; now progressed; some changes in ADLs; now consistent with moderate dementia) - continue memantine  10mg  twice a day  - safety / supervision issues reviewed - daily physical activity / exercise (at least 15-30 minutes) - eat more plants / vegetables - increase social activities, brain stimulation, games, puzzles, hobbies,  crafts, arts, music - aim for at least 7-8 hours sleep per night (or more) - avoid smoking and alcohol - caution with medications, finances; no driving  Meds ordered this encounter  Medications   ethosuximide  (ZARONTIN ) 250 MG capsule    Sig: Take 2 capsules (500 mg total) by mouth 2 (two) times daily.    Dispense:  360 capsule    Refill:  4   carbamazepine  (TEGRETOL  XR) 200 MG 12 hr tablet    Sig: Take 3 tablets (600 mg total) by mouth 2 (two) times daily.    Dispense:  540 tablet    Refill:  4   memantine  (NAMENDA ) 10 MG tablet    Sig: Take 1 tablet (10 mg total) by mouth 2 (two) times daily.    Dispense:  180 tablet    Refill:  4   Return in about 1 year (around 04/02/2025) for MyChart visit (15 min).    EDUARD FABIENE HANLON, MD 04/02/2024, 11:23 AM Certified in Neurology, Neurophysiology and Neuroimaging  [facility] [address], [address] [place], [place] [address] [phone]

## 2024-04-02 NOTE — Patient Instructions (Signed)
  SEIZURE DISORDER --> NEW ONSET MYOCLONUS, MEMORY LOSS, CONFUSION,  FATIGUE (in 09/24/22 and Feb 2025, related to low levels)  - continue carbamazepine  600mg  twice a day + ethosuximide  500mg  twice a day  - seizure precautions reviewed - ok to proceed with colonosccopy  MEMORY LOSS (MCI diagnosed in 2019; now progressed; some changes in ADLs; now consistent with moderate dementia) - continue memantine  10mg  twice a day  - safety / supervision issues reviewed - daily physical activity / exercise (at least 15-30 minutes) - eat more plants / vegetables - increase social activities, brain stimulation, games, puzzles, hobbies, crafts, arts, music - aim for at least 7-8 hours sleep per night (or more) - avoid smoking and alcohol - caution with medications, finances; no driving

## 2024-04-03 DIAGNOSIS — I10 Essential (primary) hypertension: Secondary | ICD-10-CM | POA: Diagnosis not present

## 2024-04-03 DIAGNOSIS — I2510 Atherosclerotic heart disease of native coronary artery without angina pectoris: Secondary | ICD-10-CM | POA: Diagnosis not present

## 2024-04-03 DIAGNOSIS — F0393 Unspecified dementia, unspecified severity, with mood disturbance: Secondary | ICD-10-CM | POA: Diagnosis not present

## 2024-04-03 DIAGNOSIS — K219 Gastro-esophageal reflux disease without esophagitis: Secondary | ICD-10-CM | POA: Diagnosis not present

## 2024-04-03 DIAGNOSIS — F32A Depression, unspecified: Secondary | ICD-10-CM | POA: Diagnosis not present

## 2024-04-03 DIAGNOSIS — S32512D Fracture of superior rim of left pubis, subsequent encounter for fracture with routine healing: Secondary | ICD-10-CM | POA: Diagnosis not present

## 2024-04-07 DIAGNOSIS — Z7902 Long term (current) use of antithrombotics/antiplatelets: Secondary | ICD-10-CM | POA: Diagnosis not present

## 2024-04-07 DIAGNOSIS — I10 Essential (primary) hypertension: Secondary | ICD-10-CM | POA: Diagnosis not present

## 2024-04-07 DIAGNOSIS — F0393 Unspecified dementia, unspecified severity, with mood disturbance: Secondary | ICD-10-CM | POA: Diagnosis not present

## 2024-04-07 DIAGNOSIS — K219 Gastro-esophageal reflux disease without esophagitis: Secondary | ICD-10-CM | POA: Diagnosis not present

## 2024-04-07 DIAGNOSIS — I2510 Atherosclerotic heart disease of native coronary artery without angina pectoris: Secondary | ICD-10-CM | POA: Diagnosis not present

## 2024-04-07 DIAGNOSIS — S32512D Fracture of superior rim of left pubis, subsequent encounter for fracture with routine healing: Secondary | ICD-10-CM | POA: Diagnosis not present

## 2024-04-07 DIAGNOSIS — Z556 Problems related to health literacy: Secondary | ICD-10-CM | POA: Diagnosis not present

## 2024-04-07 DIAGNOSIS — Z9181 History of falling: Secondary | ICD-10-CM | POA: Diagnosis not present

## 2024-04-07 DIAGNOSIS — F32A Depression, unspecified: Secondary | ICD-10-CM | POA: Diagnosis not present

## 2024-04-07 DIAGNOSIS — Z7982 Long term (current) use of aspirin: Secondary | ICD-10-CM | POA: Diagnosis not present

## 2024-04-09 ENCOUNTER — Ambulatory Visit: Attending: Cardiology | Admitting: Cardiology

## 2024-04-09 ENCOUNTER — Encounter: Payer: Self-pay | Admitting: Cardiology

## 2024-04-09 VITALS — BP 110/60 | HR 81 | Ht 67.0 in | Wt 160.0 lb

## 2024-04-09 DIAGNOSIS — E785 Hyperlipidemia, unspecified: Secondary | ICD-10-CM | POA: Insufficient documentation

## 2024-04-09 DIAGNOSIS — I25118 Atherosclerotic heart disease of native coronary artery with other forms of angina pectoris: Secondary | ICD-10-CM | POA: Insufficient documentation

## 2024-04-09 DIAGNOSIS — I495 Sick sinus syndrome: Secondary | ICD-10-CM | POA: Insufficient documentation

## 2024-04-09 DIAGNOSIS — Z950 Presence of cardiac pacemaker: Secondary | ICD-10-CM | POA: Insufficient documentation

## 2024-04-09 DIAGNOSIS — I951 Orthostatic hypotension: Secondary | ICD-10-CM | POA: Diagnosis not present

## 2024-04-09 NOTE — Patient Instructions (Signed)
 Medication Instructions:  Continue same medications *If you need a refill on your cardiac medications before your next appointment, please call your pharmacy*  Lab Work: None ordered  Testing/Procedures: None ordered  Follow-Up: At [facility], you and your health needs are our priority.  As part of our continuing mission to provide you with exceptional heart care, our providers are all part of one team.  This team includes your primary Cardiologist (physician) and Advanced Practice Providers or APPs (Physician Assistants and Nurse Practitioners) who all work together to provide you with the care you need, when you need it.  Your next appointment  6 months   Call in Jan to schedule April appointment     Provider:  Dr.Jordan   We recommend signing up for the patient portal called MyChart.  Sign up information is provided on this After Visit Summary.  MyChart is used to connect with patients for Virtual Visits (Telemedicine).  Patients are able to view lab/test results, encounter notes, upcoming appointments, etc.  Non-urgent messages can be sent to your provider as well.   To learn more about what you can do with MyChart, go to [email or web address].

## 2024-04-14 DIAGNOSIS — I10 Essential (primary) hypertension: Secondary | ICD-10-CM | POA: Diagnosis not present

## 2024-04-14 DIAGNOSIS — I2510 Atherosclerotic heart disease of native coronary artery without angina pectoris: Secondary | ICD-10-CM | POA: Diagnosis not present

## 2024-04-14 DIAGNOSIS — S32512D Fracture of superior rim of left pubis, subsequent encounter for fracture with routine healing: Secondary | ICD-10-CM | POA: Diagnosis not present

## 2024-04-14 DIAGNOSIS — F0393 Unspecified dementia, unspecified severity, with mood disturbance: Secondary | ICD-10-CM | POA: Diagnosis not present

## 2024-04-14 DIAGNOSIS — F32A Depression, unspecified: Secondary | ICD-10-CM | POA: Diagnosis not present

## 2024-04-14 DIAGNOSIS — K219 Gastro-esophageal reflux disease without esophagitis: Secondary | ICD-10-CM | POA: Diagnosis not present

## 2024-04-15 DIAGNOSIS — Z860101 Personal history of adenomatous and serrated colon polyps: Secondary | ICD-10-CM | POA: Diagnosis not present

## 2024-04-17 DIAGNOSIS — I10 Essential (primary) hypertension: Secondary | ICD-10-CM | POA: Diagnosis not present

## 2024-04-17 DIAGNOSIS — I25119 Atherosclerotic heart disease of native coronary artery with unspecified angina pectoris: Secondary | ICD-10-CM | POA: Diagnosis not present

## 2024-04-17 DIAGNOSIS — F02B3 Dementia in other diseases classified elsewhere, moderate, with mood disturbance: Secondary | ICD-10-CM | POA: Diagnosis not present

## 2024-04-17 DIAGNOSIS — D500 Iron deficiency anemia secondary to blood loss (chronic): Secondary | ICD-10-CM | POA: Diagnosis not present

## 2024-04-17 DIAGNOSIS — R7303 Prediabetes: Secondary | ICD-10-CM | POA: Diagnosis not present

## 2024-04-17 DIAGNOSIS — F324 Major depressive disorder, single episode, in partial remission: Secondary | ICD-10-CM | POA: Diagnosis not present

## 2024-04-17 DIAGNOSIS — Z860101 Personal history of adenomatous and serrated colon polyps: Secondary | ICD-10-CM | POA: Diagnosis not present

## 2024-04-17 DIAGNOSIS — E039 Hypothyroidism, unspecified: Secondary | ICD-10-CM | POA: Diagnosis not present

## 2024-04-17 DIAGNOSIS — G720 Drug-induced myopathy: Secondary | ICD-10-CM | POA: Diagnosis not present

## 2024-04-17 DIAGNOSIS — E785 Hyperlipidemia, unspecified: Secondary | ICD-10-CM | POA: Diagnosis not present

## 2024-04-26 DIAGNOSIS — F0393 Unspecified dementia, unspecified severity, with mood disturbance: Secondary | ICD-10-CM | POA: Diagnosis not present

## 2024-04-26 DIAGNOSIS — K219 Gastro-esophageal reflux disease without esophagitis: Secondary | ICD-10-CM | POA: Diagnosis not present

## 2024-04-26 DIAGNOSIS — I10 Essential (primary) hypertension: Secondary | ICD-10-CM | POA: Diagnosis not present

## 2024-04-26 DIAGNOSIS — S32512D Fracture of superior rim of left pubis, subsequent encounter for fracture with routine healing: Secondary | ICD-10-CM | POA: Diagnosis not present

## 2024-04-26 DIAGNOSIS — F32A Depression, unspecified: Secondary | ICD-10-CM | POA: Diagnosis not present

## 2024-04-26 DIAGNOSIS — I2510 Atherosclerotic heart disease of native coronary artery without angina pectoris: Secondary | ICD-10-CM | POA: Diagnosis not present

## 2024-04-30 DIAGNOSIS — S32512D Fracture of superior rim of left pubis, subsequent encounter for fracture with routine healing: Secondary | ICD-10-CM | POA: Diagnosis not present

## 2024-04-30 DIAGNOSIS — I10 Essential (primary) hypertension: Secondary | ICD-10-CM | POA: Diagnosis not present

## 2024-04-30 DIAGNOSIS — I2510 Atherosclerotic heart disease of native coronary artery without angina pectoris: Secondary | ICD-10-CM | POA: Diagnosis not present

## 2024-04-30 DIAGNOSIS — K219 Gastro-esophageal reflux disease without esophagitis: Secondary | ICD-10-CM | POA: Diagnosis not present

## 2024-04-30 DIAGNOSIS — F0393 Unspecified dementia, unspecified severity, with mood disturbance: Secondary | ICD-10-CM | POA: Diagnosis not present

## 2024-04-30 DIAGNOSIS — F32A Depression, unspecified: Secondary | ICD-10-CM | POA: Diagnosis not present

## 2024-05-13 ENCOUNTER — Telehealth: Payer: Self-pay | Admitting: Pharmacy Technician

## 2024-05-13 ENCOUNTER — Other Ambulatory Visit (HOSPITAL_COMMUNITY): Payer: Self-pay

## 2024-05-13 ENCOUNTER — Telehealth: Payer: Self-pay | Admitting: Cardiology

## 2024-05-13 NOTE — Telephone Encounter (Signed)
 Pt c/o medication issue:  1. Name of Medication:   prasugrel  (EFFIENT ) 10 MG TABS tablet    2. How are you currently taking this medication (dosage and times per day)?    3. Are you having a reaction (difficulty breathing--STAT)? no  4. What is your medication issue? Husband states that insurance will not cover the medication. They suggest that patient take clopidogrel  75mg . Please advise

## 2024-05-14 NOTE — Telephone Encounter (Signed)
 Medication is covered under her plan. Suggest filling at [facility] pharmacies if her current pharmacy will not fill for her

## 2024-05-19 ENCOUNTER — Other Ambulatory Visit

## 2024-05-20 ENCOUNTER — Encounter (HOSPITAL_COMMUNITY): Payer: Medicare Other

## 2024-05-20 DIAGNOSIS — I495 Sick sinus syndrome: Secondary | ICD-10-CM

## 2024-05-23 LAB — CUP PACEART REMOTE DEVICE CHECK
Battery Remaining Longevity: 33 mo
Battery Voltage: 2.94 V
Brady Statistic AP VP Percent: 0.02 %
Brady Statistic AP VS Percent: 2.73 %
Brady Statistic AS VP Percent: 0.06 %
Brady Statistic AS VS Percent: 97.19 %
Brady Statistic RA Percent Paced: 2.75 %
Brady Statistic RV Percent Paced: 0.08 %
Date Time Interrogation Session: 20251121114724
Implantable Lead Connection Status: 753985
Implantable Lead Connection Status: 753985
Implantable Lead Implant Date: 20160922
Implantable Lead Implant Date: 20160922
Implantable Lead Location: 753859
Implantable Lead Location: 753860
Implantable Lead Model: 5076
Implantable Lead Model: 5076
Implantable Pulse Generator Implant Date: 20160922
Lead Channel Impedance Value: 1349 Ohm
Lead Channel Impedance Value: 1387 Ohm
Lead Channel Impedance Value: 380 Ohm
Lead Channel Impedance Value: 456 Ohm
Lead Channel Pacing Threshold Amplitude: 0.75 V
Lead Channel Pacing Threshold Amplitude: 1.375 V
Lead Channel Pacing Threshold Pulse Width: 0.4 ms
Lead Channel Pacing Threshold Pulse Width: 0.4 ms
Lead Channel Sensing Intrinsic Amplitude: 2.375 mV
Lead Channel Sensing Intrinsic Amplitude: 2.375 mV
Lead Channel Sensing Intrinsic Amplitude: 6.25 mV
Lead Channel Sensing Intrinsic Amplitude: 6.25 mV
Lead Channel Setting Pacing Amplitude: 2.5 V
Lead Channel Setting Pacing Amplitude: 2.5 V
Lead Channel Setting Pacing Pulse Width: 0.4 ms
Lead Channel Setting Sensing Sensitivity: 2.8 mV
Zone Setting Status: 755011
Zone Setting Status: 755011

## 2024-05-23 NOTE — Progress Notes (Signed)
 Remote PPM Transmission

## 2024-06-02 ENCOUNTER — Ambulatory Visit: Payer: Self-pay | Admitting: Cardiovascular Disease

## 2024-06-18 ENCOUNTER — Other Ambulatory Visit: Payer: Self-pay | Admitting: Cardiology

## 2024-06-19 MED ORDER — ISOSORBIDE MONONITRATE ER 30 MG PO TB24: 15.0000 mg | ORAL_TABLET | Freq: Every day | ORAL | 3 refills | Status: AC

## 2024-07-07 ENCOUNTER — Other Ambulatory Visit: Payer: Self-pay | Admitting: Cardiology

## 2024-07-08 ENCOUNTER — Telehealth: Payer: Self-pay | Admitting: Cardiology

## 2024-07-08 NOTE — Telephone Encounter (Signed)
 Pt c/o medication issue:  1. Name of Medication:   Evolocumab  (REPATHA  SURECLICK) 140 MG/ML SOAJ   2. How are you currently taking this medication (dosage and times per day)?   As prescribed  3. Are you having a reaction (difficulty breathing--STAT)?   4. What is your medication issue?   Husband Sydnee) wants to get assistance getting patient this medication.

## 2024-07-09 MED ORDER — AMLODIPINE BESYLATE 10 MG PO TABS: 10.0000 mg | ORAL_TABLET | Freq: Every day | ORAL | 0 refills | Status: AC

## 2024-07-10 ENCOUNTER — Telehealth: Payer: Self-pay | Admitting: Pharmacy Technician

## 2024-07-10 ENCOUNTER — Other Ambulatory Visit (HOSPITAL_COMMUNITY): Payer: Self-pay

## 2024-07-10 NOTE — Telephone Encounter (Signed)
" ° °  Patient Advocate Encounter   The patient was approved for a [employer] grant that will help cover the cost of REPATHA  Total amount awarded, 2500.  Effective: 06/10/24 - 06/09/25   APW:[id number] ERW:[id number] Hmnle:[id number] PI:[id number] [employer] ID: [id number]   Pharmacy provided with approval and processing information. Patient informed via mychart  "

## 2024-07-15 ENCOUNTER — Telehealth: Payer: Self-pay | Admitting: Cardiology

## 2024-07-15 NOTE — Telephone Encounter (Signed)
 Pt c/o medication issue:  1. Name of Medication: amLODipine  (NORVASC ) 10 MG tablet   2. How are you currently taking this medication (dosage and times per day)?    3. Are you having a reaction (difficulty breathing--STAT)? no  4. What is your medication issue? Patient's husband calling to see why there was increase in the dosage for this medication. Please advise

## 2024-07-15 NOTE — Telephone Encounter (Signed)
 Spoke to [place] pts husband. Stated he had already spoke to someone. Advised James that if he has been giving pt 5mg  and is moving to 10mg  as prescribed to keep a log of pts BP/HR. [place] stated understanding.

## 2024-07-22 ENCOUNTER — Other Ambulatory Visit: Payer: Self-pay | Admitting: Cardiology

## 2024-08-19 ENCOUNTER — Encounter (HOSPITAL_COMMUNITY): Payer: Medicare Other

## 2024-11-18 ENCOUNTER — Encounter (HOSPITAL_COMMUNITY): Payer: Medicare Other

## 2025-01-05 ENCOUNTER — Inpatient Hospital Stay

## 2025-01-15 ENCOUNTER — Inpatient Hospital Stay: Admitting: Hematology and Oncology

## 2025-02-17 ENCOUNTER — Encounter (HOSPITAL_COMMUNITY): Payer: Medicare Other

## 2025-04-06 ENCOUNTER — Telehealth: Admitting: Diagnostic Neuroimaging

## 2025-05-19 ENCOUNTER — Encounter (HOSPITAL_COMMUNITY): Payer: Medicare Other
